# Patient Record
Sex: Male | Born: 1940
Health system: Southern US, Community
[De-identification: ages and names within clinical notes are randomized; demographics above are authoritative.]

## PROBLEM LIST (undated history)

## (undated) DIAGNOSIS — Z85038 Personal history of other malignant neoplasm of large intestine: Secondary | ICD-10-CM

## (undated) DIAGNOSIS — I10 Essential (primary) hypertension: Secondary | ICD-10-CM

## (undated) DIAGNOSIS — E785 Hyperlipidemia, unspecified: Secondary | ICD-10-CM

## (undated) DIAGNOSIS — G4730 Sleep apnea, unspecified: Secondary | ICD-10-CM

## (undated) DIAGNOSIS — J45909 Unspecified asthma, uncomplicated: Secondary | ICD-10-CM

## (undated) DIAGNOSIS — M549 Dorsalgia, unspecified: Secondary | ICD-10-CM

## (undated) DIAGNOSIS — Z87891 Personal history of nicotine dependence: Secondary | ICD-10-CM

## (undated) DIAGNOSIS — J439 Emphysema, unspecified: Secondary | ICD-10-CM

## (undated) DIAGNOSIS — M1990 Unspecified osteoarthritis, unspecified site: Secondary | ICD-10-CM

## (undated) DIAGNOSIS — E119 Type 2 diabetes mellitus without complications: Secondary | ICD-10-CM

## (undated) DIAGNOSIS — Z1211 Encounter for screening for malignant neoplasm of colon: Secondary | ICD-10-CM

## (undated) DIAGNOSIS — R338 Other retention of urine: Secondary | ICD-10-CM

## (undated) DIAGNOSIS — R011 Cardiac murmur, unspecified: Secondary | ICD-10-CM

## (undated) DIAGNOSIS — J449 Chronic obstructive pulmonary disease, unspecified: Secondary | ICD-10-CM

## (undated) DIAGNOSIS — K469 Unspecified abdominal hernia without obstruction or gangrene: Secondary | ICD-10-CM

## (undated) DIAGNOSIS — K635 Polyp of colon: Secondary | ICD-10-CM

## (undated) HISTORY — DX: Dorsalgia, unspecified: M54.9

## (undated) HISTORY — DX: Polyp of colon: K63.5

## (undated) HISTORY — PX: HERNIA REPAIR: SHX51

## (undated) HISTORY — DX: Unspecified abdominal hernia without obstruction or gangrene: K46.9

## (undated) HISTORY — DX: Personal history of other malignant neoplasm of large intestine: Z85.038

## (undated) HISTORY — DX: Type 2 diabetes mellitus without complications: E11.9

## (undated) HISTORY — PX: TONSILLECTOMY: SUR1361

## (undated) HISTORY — DX: Emphysema, unspecified: J43.9

## (undated) HISTORY — DX: Hyperlipidemia, unspecified: E78.5

## (undated) HISTORY — PX: JOINT REPLACEMENT: SHX530

## (undated) HISTORY — DX: Unspecified osteoarthritis, unspecified site: M19.90

## (undated) HISTORY — DX: Essential (primary) hypertension: I10

## (undated) HISTORY — DX: Personal history of nicotine dependence: Z87.891

## (undated) HISTORY — DX: Unspecified asthma, uncomplicated: J45.909

## (undated) HISTORY — PX: COLONOSCOPY: SHX174

## (undated) HISTORY — PX: TYMPANOSTOMY TUBE PLACEMENT: SHX32

## (undated) HISTORY — DX: Encounter for screening for malignant neoplasm of colon: Z12.11

---

## 1968-03-23 HISTORY — PX: CHOLECYSTECTOMY: SHX55

## 1998-03-23 DIAGNOSIS — J45909 Unspecified asthma, uncomplicated: Secondary | ICD-10-CM

## 1998-03-23 DIAGNOSIS — K469 Unspecified abdominal hernia without obstruction or gangrene: Secondary | ICD-10-CM

## 1998-03-23 HISTORY — DX: Unspecified abdominal hernia without obstruction or gangrene: K46.9

## 1998-03-23 HISTORY — DX: Unspecified asthma, uncomplicated: J45.909

## 1999-07-16 HISTORY — PX: COLON SURGERY: SHX602

## 2006-07-22 ENCOUNTER — Ambulatory Visit: Payer: Self-pay | Admitting: Unknown Physician Specialty

## 2006-07-29 ENCOUNTER — Other Ambulatory Visit: Payer: Self-pay

## 2006-07-29 ENCOUNTER — Inpatient Hospital Stay: Payer: Self-pay | Admitting: Unknown Physician Specialty

## 2008-03-23 HISTORY — PX: KNEE SURGERY: SHX244

## 2010-03-23 DIAGNOSIS — K635 Polyp of colon: Secondary | ICD-10-CM

## 2010-03-23 HISTORY — DX: Polyp of colon: K63.5

## 2010-12-30 ENCOUNTER — Ambulatory Visit: Payer: Self-pay | Admitting: General Surgery

## 2011-01-01 LAB — PATHOLOGY REPORT

## 2011-05-19 DIAGNOSIS — H2510 Age-related nuclear cataract, unspecified eye: Secondary | ICD-10-CM | POA: Diagnosis not present

## 2011-05-19 DIAGNOSIS — E119 Type 2 diabetes mellitus without complications: Secondary | ICD-10-CM | POA: Diagnosis not present

## 2011-07-08 DIAGNOSIS — H524 Presbyopia: Secondary | ICD-10-CM | POA: Diagnosis not present

## 2011-07-08 DIAGNOSIS — H53149 Visual discomfort, unspecified: Secondary | ICD-10-CM | POA: Diagnosis not present

## 2011-07-08 DIAGNOSIS — H2510 Age-related nuclear cataract, unspecified eye: Secondary | ICD-10-CM | POA: Diagnosis not present

## 2011-07-08 DIAGNOSIS — E119 Type 2 diabetes mellitus without complications: Secondary | ICD-10-CM | POA: Diagnosis not present

## 2011-07-28 ENCOUNTER — Ambulatory Visit: Payer: Self-pay | Admitting: Family Medicine

## 2011-07-28 DIAGNOSIS — R079 Chest pain, unspecified: Secondary | ICD-10-CM | POA: Diagnosis not present

## 2011-07-28 DIAGNOSIS — J9819 Other pulmonary collapse: Secondary | ICD-10-CM | POA: Diagnosis not present

## 2011-07-28 DIAGNOSIS — I517 Cardiomegaly: Secondary | ICD-10-CM | POA: Diagnosis not present

## 2011-07-28 DIAGNOSIS — E119 Type 2 diabetes mellitus without complications: Secondary | ICD-10-CM | POA: Diagnosis not present

## 2011-07-28 DIAGNOSIS — E785 Hyperlipidemia, unspecified: Secondary | ICD-10-CM | POA: Diagnosis not present

## 2011-07-28 DIAGNOSIS — I10 Essential (primary) hypertension: Secondary | ICD-10-CM | POA: Diagnosis not present

## 2011-07-31 DIAGNOSIS — I10 Essential (primary) hypertension: Secondary | ICD-10-CM | POA: Diagnosis not present

## 2011-07-31 DIAGNOSIS — E119 Type 2 diabetes mellitus without complications: Secondary | ICD-10-CM | POA: Diagnosis not present

## 2011-07-31 DIAGNOSIS — E782 Mixed hyperlipidemia: Secondary | ICD-10-CM | POA: Diagnosis not present

## 2011-07-31 DIAGNOSIS — J449 Chronic obstructive pulmonary disease, unspecified: Secondary | ICD-10-CM | POA: Diagnosis not present

## 2011-08-05 ENCOUNTER — Ambulatory Visit: Payer: Self-pay | Admitting: Family Medicine

## 2011-08-05 DIAGNOSIS — R918 Other nonspecific abnormal finding of lung field: Secondary | ICD-10-CM | POA: Diagnosis not present

## 2011-08-05 DIAGNOSIS — R059 Cough, unspecified: Secondary | ICD-10-CM | POA: Diagnosis not present

## 2011-08-05 DIAGNOSIS — IMO0002 Reserved for concepts with insufficient information to code with codable children: Secondary | ICD-10-CM | POA: Diagnosis not present

## 2011-08-07 DIAGNOSIS — R0789 Other chest pain: Secondary | ICD-10-CM | POA: Diagnosis not present

## 2011-08-11 DIAGNOSIS — H2510 Age-related nuclear cataract, unspecified eye: Secondary | ICD-10-CM | POA: Diagnosis not present

## 2011-08-11 DIAGNOSIS — H269 Unspecified cataract: Secondary | ICD-10-CM | POA: Diagnosis not present

## 2011-08-27 DIAGNOSIS — M940 Chondrocostal junction syndrome [Tietze]: Secondary | ICD-10-CM | POA: Diagnosis not present

## 2011-08-27 DIAGNOSIS — R072 Precordial pain: Secondary | ICD-10-CM | POA: Diagnosis not present

## 2011-09-09 DIAGNOSIS — H2510 Age-related nuclear cataract, unspecified eye: Secondary | ICD-10-CM | POA: Diagnosis not present

## 2011-09-22 DIAGNOSIS — H269 Unspecified cataract: Secondary | ICD-10-CM | POA: Diagnosis not present

## 2011-09-22 DIAGNOSIS — H259 Unspecified age-related cataract: Secondary | ICD-10-CM | POA: Diagnosis not present

## 2011-09-22 DIAGNOSIS — H2510 Age-related nuclear cataract, unspecified eye: Secondary | ICD-10-CM | POA: Diagnosis not present

## 2011-09-30 DIAGNOSIS — E7800 Pure hypercholesterolemia, unspecified: Secondary | ICD-10-CM | POA: Diagnosis not present

## 2011-09-30 DIAGNOSIS — I10 Essential (primary) hypertension: Secondary | ICD-10-CM | POA: Diagnosis not present

## 2011-09-30 DIAGNOSIS — IMO0001 Reserved for inherently not codable concepts without codable children: Secondary | ICD-10-CM | POA: Diagnosis not present

## 2011-11-10 DIAGNOSIS — Z0000 Encounter for general adult medical examination without abnormal findings: Secondary | ICD-10-CM | POA: Diagnosis not present

## 2011-11-10 DIAGNOSIS — E119 Type 2 diabetes mellitus without complications: Secondary | ICD-10-CM | POA: Diagnosis not present

## 2011-11-10 DIAGNOSIS — I10 Essential (primary) hypertension: Secondary | ICD-10-CM | POA: Diagnosis not present

## 2011-11-10 DIAGNOSIS — Z125 Encounter for screening for malignant neoplasm of prostate: Secondary | ICD-10-CM | POA: Diagnosis not present

## 2011-11-10 DIAGNOSIS — IMO0001 Reserved for inherently not codable concepts without codable children: Secondary | ICD-10-CM | POA: Diagnosis not present

## 2011-11-10 DIAGNOSIS — E785 Hyperlipidemia, unspecified: Secondary | ICD-10-CM | POA: Diagnosis not present

## 2011-12-02 DIAGNOSIS — I10 Essential (primary) hypertension: Secondary | ICD-10-CM | POA: Diagnosis not present

## 2011-12-02 DIAGNOSIS — L570 Actinic keratosis: Secondary | ICD-10-CM | POA: Diagnosis not present

## 2012-02-10 DIAGNOSIS — Z23 Encounter for immunization: Secondary | ICD-10-CM | POA: Diagnosis not present

## 2012-02-10 DIAGNOSIS — IMO0001 Reserved for inherently not codable concepts without codable children: Secondary | ICD-10-CM | POA: Diagnosis not present

## 2012-02-10 DIAGNOSIS — I10 Essential (primary) hypertension: Secondary | ICD-10-CM | POA: Diagnosis not present

## 2012-02-10 DIAGNOSIS — E7800 Pure hypercholesterolemia, unspecified: Secondary | ICD-10-CM | POA: Diagnosis not present

## 2012-02-11 DIAGNOSIS — E119 Type 2 diabetes mellitus without complications: Secondary | ICD-10-CM | POA: Diagnosis not present

## 2012-03-07 DIAGNOSIS — I10 Essential (primary) hypertension: Secondary | ICD-10-CM | POA: Diagnosis not present

## 2012-03-07 DIAGNOSIS — IMO0001 Reserved for inherently not codable concepts without codable children: Secondary | ICD-10-CM | POA: Diagnosis not present

## 2012-03-07 DIAGNOSIS — E7800 Pure hypercholesterolemia, unspecified: Secondary | ICD-10-CM | POA: Diagnosis not present

## 2012-03-08 DIAGNOSIS — R220 Localized swelling, mass and lump, head: Secondary | ICD-10-CM | POA: Diagnosis not present

## 2012-03-08 DIAGNOSIS — L989 Disorder of the skin and subcutaneous tissue, unspecified: Secondary | ICD-10-CM | POA: Diagnosis not present

## 2012-03-08 DIAGNOSIS — H6120 Impacted cerumen, unspecified ear: Secondary | ICD-10-CM | POA: Diagnosis not present

## 2012-03-08 DIAGNOSIS — D1039 Benign neoplasm of other parts of mouth: Secondary | ICD-10-CM | POA: Diagnosis not present

## 2012-03-08 DIAGNOSIS — R221 Localized swelling, mass and lump, neck: Secondary | ICD-10-CM | POA: Diagnosis not present

## 2012-03-08 DIAGNOSIS — H908 Mixed conductive and sensorineural hearing loss, unspecified: Secondary | ICD-10-CM | POA: Diagnosis not present

## 2012-03-08 DIAGNOSIS — K1370 Unspecified lesions of oral mucosa: Secondary | ICD-10-CM | POA: Diagnosis not present

## 2012-03-24 DIAGNOSIS — IMO0001 Reserved for inherently not codable concepts without codable children: Secondary | ICD-10-CM | POA: Diagnosis not present

## 2012-03-24 DIAGNOSIS — I10 Essential (primary) hypertension: Secondary | ICD-10-CM | POA: Diagnosis not present

## 2012-04-04 ENCOUNTER — Ambulatory Visit: Payer: Self-pay | Admitting: Otolaryngology

## 2012-04-15 DIAGNOSIS — IMO0001 Reserved for inherently not codable concepts without codable children: Secondary | ICD-10-CM | POA: Diagnosis not present

## 2012-04-18 ENCOUNTER — Ambulatory Visit: Payer: Self-pay | Admitting: Otolaryngology

## 2012-04-18 DIAGNOSIS — N419 Inflammatory disease of prostate, unspecified: Secondary | ICD-10-CM | POA: Diagnosis not present

## 2012-04-18 DIAGNOSIS — R0609 Other forms of dyspnea: Secondary | ICD-10-CM | POA: Diagnosis not present

## 2012-04-18 DIAGNOSIS — N400 Enlarged prostate without lower urinary tract symptoms: Secondary | ICD-10-CM | POA: Diagnosis not present

## 2012-04-18 DIAGNOSIS — Z79899 Other long term (current) drug therapy: Secondary | ICD-10-CM | POA: Diagnosis not present

## 2012-04-18 DIAGNOSIS — Z85038 Personal history of other malignant neoplasm of large intestine: Secondary | ICD-10-CM | POA: Diagnosis not present

## 2012-04-18 DIAGNOSIS — E119 Type 2 diabetes mellitus without complications: Secondary | ICD-10-CM | POA: Diagnosis not present

## 2012-04-18 DIAGNOSIS — E669 Obesity, unspecified: Secondary | ICD-10-CM | POA: Diagnosis not present

## 2012-04-18 DIAGNOSIS — Z7982 Long term (current) use of aspirin: Secondary | ICD-10-CM | POA: Diagnosis not present

## 2012-04-18 DIAGNOSIS — Z794 Long term (current) use of insulin: Secondary | ICD-10-CM | POA: Diagnosis not present

## 2012-04-18 DIAGNOSIS — D1779 Benign lipomatous neoplasm of other sites: Secondary | ICD-10-CM | POA: Diagnosis not present

## 2012-04-18 DIAGNOSIS — M129 Arthropathy, unspecified: Secondary | ICD-10-CM | POA: Diagnosis not present

## 2012-04-18 DIAGNOSIS — R42 Dizziness and giddiness: Secondary | ICD-10-CM | POA: Diagnosis not present

## 2012-04-18 DIAGNOSIS — R609 Edema, unspecified: Secondary | ICD-10-CM | POA: Diagnosis not present

## 2012-04-18 DIAGNOSIS — D1739 Benign lipomatous neoplasm of skin and subcutaneous tissue of other sites: Secondary | ICD-10-CM | POA: Diagnosis not present

## 2012-04-18 DIAGNOSIS — Z96659 Presence of unspecified artificial knee joint: Secondary | ICD-10-CM | POA: Diagnosis not present

## 2012-04-18 DIAGNOSIS — G4730 Sleep apnea, unspecified: Secondary | ICD-10-CM | POA: Diagnosis not present

## 2012-04-18 DIAGNOSIS — I10 Essential (primary) hypertension: Secondary | ICD-10-CM | POA: Diagnosis not present

## 2012-04-19 LAB — PATHOLOGY REPORT

## 2012-06-14 DIAGNOSIS — I10 Essential (primary) hypertension: Secondary | ICD-10-CM | POA: Diagnosis not present

## 2012-06-14 DIAGNOSIS — IMO0001 Reserved for inherently not codable concepts without codable children: Secondary | ICD-10-CM | POA: Diagnosis not present

## 2012-06-14 DIAGNOSIS — E785 Hyperlipidemia, unspecified: Secondary | ICD-10-CM | POA: Diagnosis not present

## 2012-06-14 DIAGNOSIS — E119 Type 2 diabetes mellitus without complications: Secondary | ICD-10-CM | POA: Diagnosis not present

## 2012-09-14 DIAGNOSIS — E119 Type 2 diabetes mellitus without complications: Secondary | ICD-10-CM | POA: Diagnosis not present

## 2012-09-14 DIAGNOSIS — I10 Essential (primary) hypertension: Secondary | ICD-10-CM | POA: Diagnosis not present

## 2012-09-14 DIAGNOSIS — E785 Hyperlipidemia, unspecified: Secondary | ICD-10-CM | POA: Diagnosis not present

## 2012-09-14 DIAGNOSIS — IMO0001 Reserved for inherently not codable concepts without codable children: Secondary | ICD-10-CM | POA: Diagnosis not present

## 2012-10-06 ENCOUNTER — Encounter: Payer: Self-pay | Admitting: *Deleted

## 2012-10-06 DIAGNOSIS — Z85038 Personal history of other malignant neoplasm of large intestine: Secondary | ICD-10-CM | POA: Insufficient documentation

## 2012-10-06 DIAGNOSIS — Z1211 Encounter for screening for malignant neoplasm of colon: Secondary | ICD-10-CM | POA: Insufficient documentation

## 2012-12-29 DIAGNOSIS — E119 Type 2 diabetes mellitus without complications: Secondary | ICD-10-CM | POA: Diagnosis not present

## 2012-12-29 DIAGNOSIS — Z0000 Encounter for general adult medical examination without abnormal findings: Secondary | ICD-10-CM | POA: Diagnosis not present

## 2012-12-29 DIAGNOSIS — Z125 Encounter for screening for malignant neoplasm of prostate: Secondary | ICD-10-CM | POA: Diagnosis not present

## 2012-12-29 DIAGNOSIS — G4730 Sleep apnea, unspecified: Secondary | ICD-10-CM | POA: Diagnosis not present

## 2012-12-29 DIAGNOSIS — E785 Hyperlipidemia, unspecified: Secondary | ICD-10-CM | POA: Diagnosis not present

## 2012-12-29 DIAGNOSIS — I10 Essential (primary) hypertension: Secondary | ICD-10-CM | POA: Diagnosis not present

## 2012-12-29 DIAGNOSIS — Z23 Encounter for immunization: Secondary | ICD-10-CM | POA: Diagnosis not present

## 2012-12-29 DIAGNOSIS — Q278 Other specified congenital malformations of peripheral vascular system: Secondary | ICD-10-CM | POA: Diagnosis not present

## 2013-01-12 DIAGNOSIS — G4733 Obstructive sleep apnea (adult) (pediatric): Secondary | ICD-10-CM | POA: Diagnosis not present

## 2013-01-19 DIAGNOSIS — M79609 Pain in unspecified limb: Secondary | ICD-10-CM | POA: Diagnosis not present

## 2013-01-19 DIAGNOSIS — I739 Peripheral vascular disease, unspecified: Secondary | ICD-10-CM | POA: Diagnosis not present

## 2013-01-19 DIAGNOSIS — IMO0001 Reserved for inherently not codable concepts without codable children: Secondary | ICD-10-CM | POA: Diagnosis not present

## 2013-01-19 DIAGNOSIS — M5430 Sciatica, unspecified side: Secondary | ICD-10-CM | POA: Diagnosis not present

## 2013-01-24 DIAGNOSIS — G4733 Obstructive sleep apnea (adult) (pediatric): Secondary | ICD-10-CM | POA: Diagnosis not present

## 2013-04-21 DIAGNOSIS — G4733 Obstructive sleep apnea (adult) (pediatric): Secondary | ICD-10-CM | POA: Diagnosis not present

## 2013-05-03 DIAGNOSIS — E119 Type 2 diabetes mellitus without complications: Secondary | ICD-10-CM | POA: Diagnosis not present

## 2013-05-31 DIAGNOSIS — K140 Glossitis: Secondary | ICD-10-CM | POA: Diagnosis not present

## 2013-05-31 DIAGNOSIS — J301 Allergic rhinitis due to pollen: Secondary | ICD-10-CM | POA: Diagnosis not present

## 2013-07-31 DIAGNOSIS — IMO0001 Reserved for inherently not codable concepts without codable children: Secondary | ICD-10-CM | POA: Diagnosis not present

## 2013-07-31 DIAGNOSIS — I70219 Atherosclerosis of native arteries of extremities with intermittent claudication, unspecified extremity: Secondary | ICD-10-CM | POA: Diagnosis not present

## 2013-07-31 DIAGNOSIS — E785 Hyperlipidemia, unspecified: Secondary | ICD-10-CM | POA: Diagnosis not present

## 2013-07-31 DIAGNOSIS — I10 Essential (primary) hypertension: Secondary | ICD-10-CM | POA: Diagnosis not present

## 2013-08-01 DIAGNOSIS — E119 Type 2 diabetes mellitus without complications: Secondary | ICD-10-CM | POA: Diagnosis not present

## 2013-08-01 DIAGNOSIS — I10 Essential (primary) hypertension: Secondary | ICD-10-CM | POA: Diagnosis not present

## 2013-08-01 DIAGNOSIS — E785 Hyperlipidemia, unspecified: Secondary | ICD-10-CM | POA: Diagnosis not present

## 2013-11-23 DIAGNOSIS — H6120 Impacted cerumen, unspecified ear: Secondary | ICD-10-CM | POA: Diagnosis not present

## 2013-11-23 DIAGNOSIS — H903 Sensorineural hearing loss, bilateral: Secondary | ICD-10-CM | POA: Diagnosis not present

## 2013-11-23 DIAGNOSIS — H6980 Other specified disorders of Eustachian tube, unspecified ear: Secondary | ICD-10-CM | POA: Diagnosis not present

## 2013-12-22 DIAGNOSIS — B353 Tinea pedis: Secondary | ICD-10-CM | POA: Diagnosis not present

## 2013-12-22 DIAGNOSIS — B351 Tinea unguium: Secondary | ICD-10-CM | POA: Diagnosis not present

## 2013-12-22 DIAGNOSIS — E119 Type 2 diabetes mellitus without complications: Secondary | ICD-10-CM | POA: Diagnosis not present

## 2013-12-22 DIAGNOSIS — M79675 Pain in left toe(s): Secondary | ICD-10-CM | POA: Diagnosis not present

## 2014-01-29 DIAGNOSIS — G4730 Sleep apnea, unspecified: Secondary | ICD-10-CM | POA: Diagnosis not present

## 2014-01-29 DIAGNOSIS — M179 Osteoarthritis of knee, unspecified: Secondary | ICD-10-CM | POA: Diagnosis not present

## 2014-01-29 DIAGNOSIS — C189 Malignant neoplasm of colon, unspecified: Secondary | ICD-10-CM | POA: Diagnosis not present

## 2014-01-29 DIAGNOSIS — E119 Type 2 diabetes mellitus without complications: Secondary | ICD-10-CM | POA: Diagnosis not present

## 2014-01-29 DIAGNOSIS — I10 Essential (primary) hypertension: Secondary | ICD-10-CM | POA: Diagnosis not present

## 2014-01-29 DIAGNOSIS — I779 Disorder of arteries and arterioles, unspecified: Secondary | ICD-10-CM | POA: Diagnosis not present

## 2014-01-29 DIAGNOSIS — E785 Hyperlipidemia, unspecified: Secondary | ICD-10-CM | POA: Diagnosis not present

## 2014-04-03 DIAGNOSIS — H6983 Other specified disorders of Eustachian tube, bilateral: Secondary | ICD-10-CM | POA: Diagnosis not present

## 2014-04-03 DIAGNOSIS — H60393 Other infective otitis externa, bilateral: Secondary | ICD-10-CM | POA: Diagnosis not present

## 2014-05-18 DIAGNOSIS — G4733 Obstructive sleep apnea (adult) (pediatric): Secondary | ICD-10-CM | POA: Diagnosis not present

## 2014-06-07 DIAGNOSIS — K4090 Unilateral inguinal hernia, without obstruction or gangrene, not specified as recurrent: Secondary | ICD-10-CM | POA: Diagnosis not present

## 2014-06-08 ENCOUNTER — Encounter: Payer: Self-pay | Admitting: General Surgery

## 2014-06-18 ENCOUNTER — Ambulatory Visit: Payer: Medicare Other | Admitting: General Surgery

## 2014-06-20 ENCOUNTER — Encounter: Payer: Self-pay | Admitting: General Surgery

## 2014-06-20 ENCOUNTER — Ambulatory Visit (INDEPENDENT_AMBULATORY_CARE_PROVIDER_SITE_OTHER): Payer: Medicare Other | Admitting: General Surgery

## 2014-06-20 VITALS — BP 140/70 | HR 76 | Resp 20 | Ht 72.0 in | Wt 292.0 lb

## 2014-06-20 DIAGNOSIS — L918 Other hypertrophic disorders of the skin: Secondary | ICD-10-CM

## 2014-06-20 DIAGNOSIS — R1031 Right lower quadrant pain: Secondary | ICD-10-CM

## 2014-06-20 DIAGNOSIS — R1030 Lower abdominal pain, unspecified: Secondary | ICD-10-CM | POA: Diagnosis not present

## 2014-06-20 NOTE — Patient Instructions (Signed)
The patient is aware to call back for any questions or concerns.  

## 2014-06-20 NOTE — Progress Notes (Signed)
Patient ID: Justin Griffith, male   DOB: 1940-05-19, 74 y.o.   MRN: [id number]  Chief Complaint  Patient presents with  . Other    hernia    HPI Justin Griffith is a 74 y.o. male.  Here today for evaluation of a possible right inguinal hernia. He states it has been there for about 8-9 years after he lifted a large TV. He states about  5-6 months ago it started hurting. The pain radiates up to his back. The pain does not last long. He has recently lost over 50 pounds by greatly modifying his diet. Bowels move every other day, no bleeding.   HPI  Past Medical History  Diagnosis Date  . Arthritis   . Diabetes mellitus without complication   . Hernia 2000  . Hypertension   . Personal history of tobacco use, presenting hazards to health   . Asthma 2000  . Special screening for malignant neoplasms, colon   . Personal history of malignant neoplasm of large intestine   . Hyperlipidemia   . Obesity, unspecified   . Colon polyp 2012  . Back pain   . Cancer 2001    Sigmoid colon, T1, N2 status post adjuvant chemotherapy     Past Surgical History  Procedure Laterality Date  . Cholecystectomy  1970  . Knee surgery Bilateral 2010  . Colonoscopy  2001, 2012    Dr Bary Castilla, tubular adenoma of the cecum and ascending colon in 2012.  . Colon surgery  07-16-99    sigmoid colon resection with primary anastomosis, chemotherapy for metastatic disease    Family History  Problem Relation Age of Onset  . Diabetes Father   . Cancer Mother     Social History History  Substance Use Topics  . Smoking status: Former Smoker -- 1.00 packs/day for 30 years    Quit date: 03/23/1989  . Smokeless tobacco: Never Used  . Alcohol Use: 0.0 oz/week    0 Standard drinks or equivalent per week     Comment: occasionally    No Known Allergies  Current Outpatient Prescriptions  Medication Sig Dispense Refill  . aspirin EC 81 MG tablet Take by mouth.    . cloNIDine (CATAPRES) 0.1 MG tablet Take 0.1 mg  by mouth daily.    . dapagliflozin propanediol (FARXIGA) 10 MG TABS tablet Take 10 mg by mouth daily.    . fenofibrate (TRICOR) 145 MG tablet Take 145 mg by mouth daily.    . Olmesartan-Amlodipine-HCTZ 40-10-25 MG TABS Take by mouth daily.    . rosuvastatin (CRESTOR) 40 MG tablet Take 40 mg by mouth daily.    . valACYclovir (VALTREX) 500 MG tablet Take 500 mg by mouth daily.     No current facility-administered medications for this visit.    Review of Systems Review of Systems  Constitutional: Negative.   Respiratory: Positive for cough.   Cardiovascular: Negative.   Genitourinary: Positive for difficulty urinating.    Blood pressure 140/70, pulse 76, resp. rate 20, height 6' (1.829 m), weight 292 lb (132.45 kg).  Physical Exam Physical Exam  Constitutional: He is oriented to person, place, and time. He appears well-developed and well-nourished.  Neck: Neck supple.  Cardiovascular: Normal rate, regular rhythm and normal heart sounds.   Pulses:      Femoral pulses are 2+ on the right side, and 2+ on the left side. Pulmonary/Chest: Effort normal and breath sounds normal.  Abdominal: Soft. Normal appearance and bowel sounds are normal. There is no  tenderness. No hernia.  Musculoskeletal:       Legs: Lymphadenopathy:    He has no cervical adenopathy.  Neurological: He is alert and oriented to person, place, and time.  Skin: Skin is warm and dry.  6 cm lipoma T8 left of midline. Left inner thigh 6 mm irritated skin tag.    Data Reviewed 2012 colonoscopy.  PCP notes of 06/07/2014 from Golden Pop, M.D.    Assessment    No evidence of inguinal or ventral hernia.  Irritated skin tag.    Plan    The patient is due for a repeat colonoscopy in 2017. If the skin tag in the left inner thigh remains irritated we'll arrange for excision as an office procedure.    Recommend excision left inner thigh skin tag.      PCP:  Kirkland Hun 06/21/2014,  4:15 PM

## 2014-06-21 ENCOUNTER — Encounter: Payer: Self-pay | Admitting: General Surgery

## 2014-06-21 DIAGNOSIS — L918 Other hypertrophic disorders of the skin: Secondary | ICD-10-CM | POA: Insufficient documentation

## 2014-06-21 DIAGNOSIS — R1031 Right lower quadrant pain: Secondary | ICD-10-CM | POA: Insufficient documentation

## 2014-06-28 DIAGNOSIS — B351 Tinea unguium: Secondary | ICD-10-CM | POA: Diagnosis not present

## 2014-06-28 DIAGNOSIS — M79675 Pain in left toe(s): Secondary | ICD-10-CM | POA: Diagnosis not present

## 2014-06-28 DIAGNOSIS — L97521 Non-pressure chronic ulcer of other part of left foot limited to breakdown of skin: Secondary | ICD-10-CM | POA: Diagnosis not present

## 2014-07-02 ENCOUNTER — Ambulatory Visit (INDEPENDENT_AMBULATORY_CARE_PROVIDER_SITE_OTHER): Payer: Medicare Other | Admitting: General Surgery

## 2014-07-02 ENCOUNTER — Encounter: Payer: Self-pay | Admitting: General Surgery

## 2014-07-02 VITALS — BP 136/78 | HR 80 | Resp 14 | Ht 72.0 in | Wt 288.0 lb

## 2014-07-02 DIAGNOSIS — L918 Other hypertrophic disorders of the skin: Secondary | ICD-10-CM | POA: Diagnosis not present

## 2014-07-02 NOTE — Progress Notes (Signed)
Patient ID: Justin Griffith, male   DOB: 11-23-1940, 74 y.o.   MRN: [id number]  Chief Complaint  Patient presents with  . Other    SKINTAG REMOVAL    HPI Justin Griffith is a 74 y.o. male  Here for removal of skin tag from back of left upper leg.  HPI  Past Medical History  Diagnosis Date  . Arthritis   . Diabetes mellitus without complication   . Hernia 2000  . Hypertension   . Personal history of tobacco use, presenting hazards to health   . Asthma 2000  . Special screening for malignant neoplasms, colon   . Personal history of malignant neoplasm of large intestine   . Hyperlipidemia   . Obesity, unspecified   . Colon polyp 2012  . Back pain   . Cancer 2001    Sigmoid colon, T1, N2 status post adjuvant chemotherapy     Past Surgical History  Procedure Laterality Date  . Cholecystectomy  1970  . Knee surgery Bilateral 2010  . Colonoscopy  2001, 2012    Dr Bary Castilla, tubular adenoma of the cecum and ascending colon in 2012.  . Colon surgery  07-16-99    sigmoid colon resection with primary anastomosis, chemotherapy for metastatic disease    Family History  Problem Relation Age of Onset  . Diabetes Father   . Cancer Mother     Social History History  Substance Use Topics  . Smoking status: Former Smoker -- 1.00 packs/day for 30 years    Quit date: 03/23/1989  . Smokeless tobacco: Never Used  . Alcohol Use: 0.0 oz/week    0 Standard drinks or equivalent per week     Comment: occasionally    No Known Allergies  Current Outpatient Prescriptions  Medication Sig Dispense Refill  . aspirin EC 81 MG tablet Take by mouth.    . cloNIDine (CATAPRES) 0.1 MG tablet Take 0.1 mg by mouth daily.    . dapagliflozin propanediol (FARXIGA) 10 MG TABS tablet Take 10 mg by mouth daily.    . fenofibrate (TRICOR) 145 MG tablet Take 145 mg by mouth daily.    . Olmesartan-Amlodipine-HCTZ 40-10-25 MG TABS Take by mouth daily.    . rosuvastatin (CRESTOR) 40 MG tablet Take 40 mg by  mouth daily.    . valACYclovir (VALTREX) 500 MG tablet Take 500 mg by mouth daily.     No current facility-administered medications for this visit.    Review of Systems Review of Systems  Constitutional: Negative.   Respiratory: Negative.   Cardiovascular: Negative.     Blood pressure 136/78, pulse 80, resp. rate 14, height 6' (1.829 m), weight 288 lb (130.636 kg).  Physical Exam Physical Exam  Genitourinary:          Assessment    Left medial proximal calf skin tag    Plan    The patient was asked to apply Neosporin to the area until its completely healed. Follow-up will be on an as-needed basis. No specimen was sent for pathologic review.    PCP: Kirkland Hun 07/03/2014, 4:08 PM

## 2014-07-05 DIAGNOSIS — I779 Disorder of arteries and arterioles, unspecified: Secondary | ICD-10-CM | POA: Diagnosis not present

## 2014-07-05 DIAGNOSIS — E785 Hyperlipidemia, unspecified: Secondary | ICD-10-CM | POA: Diagnosis not present

## 2014-07-05 DIAGNOSIS — Z0000 Encounter for general adult medical examination without abnormal findings: Secondary | ICD-10-CM | POA: Diagnosis not present

## 2014-07-05 DIAGNOSIS — I10 Essential (primary) hypertension: Secondary | ICD-10-CM | POA: Diagnosis not present

## 2014-07-05 DIAGNOSIS — E119 Type 2 diabetes mellitus without complications: Secondary | ICD-10-CM | POA: Diagnosis not present

## 2014-07-05 DIAGNOSIS — Z125 Encounter for screening for malignant neoplasm of prostate: Secondary | ICD-10-CM | POA: Diagnosis not present

## 2014-07-06 DIAGNOSIS — H906 Mixed conductive and sensorineural hearing loss, bilateral: Secondary | ICD-10-CM | POA: Diagnosis not present

## 2014-07-06 DIAGNOSIS — H6981 Other specified disorders of Eustachian tube, right ear: Secondary | ICD-10-CM | POA: Diagnosis not present

## 2014-07-13 NOTE — Op Note (Signed)
DATE OF BIRTH:  04/28/1940  DATE OF PROCEDURE:  04/18/2012  PREOPERATIVE DIAGNOSIS:  Deep neck mass.   POSTOPERATIVE DIAGNOSIS:  Deep neck mass.   PROCEDURE PERFORMED:  Excision of a left-sided deep neck mass.   ANESTHESIA:  [name].   INTRAVENOUS FLUIDS:  Please see anesthesia record.   COMPLICATIONS:  None.   DRAINS AND PLACEMENTS:  A size 7 TLS drain.   SPECIMENS:  Left-sided neck mass.   SURGEON:  Jerene Bears, MD  ASSISTANT:  Mahlon Gammon, MD  INDICATIONS FOR PROCEDURE:  The patient is a 74 year old male with history of a left-sided neck mass that has gradually increased. He previously was evaluated and diagnosed with a probable lipoma by FNA.   OPERATIVE FINDINGS:  Large interdigitated left-sided neck mass deep to the platysma adjacent to the submandibular gland. It was circumferentially excised.   DESCRIPTION OF PROCEDURE:  After the patient was identified in the holding room, the benefits and risks of the procedure were discussed, and consent was reviewed. The patient was taken to the operating room and placed in the supine position. [name] anesthesia was induced. The patient was prepped and draped in a sterile fashion after injection of 6 mL of 1% lidocaine with 1:100,000 epinephrine was injected into the patient's left anterior neck.   A 15 blade scalpel was used to make a previously marked left neck incision on a previously marked anterior neck crease. Dissection was carried through subcutaneous tissues down to the level of the platysma using bipolar electrocautery and sharp techniques. The platysma was encountered, and just deep to the platysma, fibrotically scarred to the underlying platysma was a lipomatous lesion. Using a combination of blunt and sharp techniques, the lipoma was circumferentially dissected anteriorly and inferiorly and posteriorly initially. This was separated from surrounding fatty tissue, and care was taken superiorly for the marginal mandibular  nerve. Using blunt and sharp techniques, the lipoma was separated from the underlying platysma and then the subcutaneous tissues as well, and the left submandibular gland was encountered. Using blunt techniques, the lipoma was separated from the inferior aspect of the submandibular gland.   At this time, the lipoma was passed off the table for permanent pathological evaluation. The wound was copiously irrigated and inspected. No obvious remaining lipoma was noted, but again there was fibrotic scarring to the platysma, as well as the surrounding subcutaneous fat. Meticulous hemostasis was achieved. A size 7 TLS drain was placed in the patient's left neck. Subcutaneous Vicryls were placed for reapproximation of the skin edges, and then the skin was closed with Dermabond skin adhesive. The patient tolerated the procedure well and was taken to PACU in good condition.    ____________________________ Jerene Bears, MD ccv:ms D: 04/18/2012 13:46:26 ET T: 04/18/2012 21:47:36 ET JOB#: [id number]  cc: Jerene Bears, MD, <Dictator> Jerene Bears MD ELECTRONICALLY SIGNED 04/19/2012 12:46

## 2014-07-20 DIAGNOSIS — G4733 Obstructive sleep apnea (adult) (pediatric): Secondary | ICD-10-CM | POA: Diagnosis not present

## 2014-08-06 DIAGNOSIS — H60542 Acute eczematoid otitis externa, left ear: Secondary | ICD-10-CM | POA: Diagnosis not present

## 2014-08-06 DIAGNOSIS — J019 Acute sinusitis, unspecified: Secondary | ICD-10-CM | POA: Diagnosis not present

## 2014-08-06 DIAGNOSIS — H6983 Other specified disorders of Eustachian tube, bilateral: Secondary | ICD-10-CM | POA: Diagnosis not present

## 2014-08-07 ENCOUNTER — Encounter: Payer: Self-pay | Admitting: General Surgery

## 2014-08-09 ENCOUNTER — Encounter
Admission: RE | Admit: 2014-08-09 | Discharge: 2014-08-09 | Disposition: A | Discharge status: Home / Self Care | Payer: Medicare Other | Source: Ambulatory Visit | Attending: Otolaryngology | Admitting: Otolaryngology

## 2014-08-09 DIAGNOSIS — Z01812 Encounter for preprocedural laboratory examination: Secondary | ICD-10-CM | POA: Insufficient documentation

## 2014-08-09 DIAGNOSIS — I10 Essential (primary) hypertension: Secondary | ICD-10-CM | POA: Diagnosis not present

## 2014-08-09 DIAGNOSIS — Z01810 Encounter for preprocedural cardiovascular examination: Secondary | ICD-10-CM | POA: Diagnosis not present

## 2014-08-09 DIAGNOSIS — H902 Conductive hearing loss, unspecified: Secondary | ICD-10-CM | POA: Insufficient documentation

## 2014-08-09 HISTORY — DX: Sleep apnea, unspecified: G47.30

## 2014-08-09 LAB — BASIC METABOLIC PANEL
Anion gap: 7 (ref 5–15)
BUN: 29 mg/dL — ABNORMAL HIGH (ref 6–20)
CO2: 29 mmol/L (ref 22–32)
Calcium: 10.9 mg/dL — ABNORMAL HIGH (ref 8.9–10.3)
Chloride: 104 mmol/L (ref 101–111)
Creatinine, Ser: 1.17 mg/dL (ref 0.61–1.24)
GFR calc Af Amer: >60 mL/min (ref >60)
GFR calc non Af Amer: 60 mL/min — ABNORMAL LOW (ref >60)
Glucose, Bld: 176 mg/dL — ABNORMAL HIGH (ref 65–99)
Potassium: 4.9 mmol/L (ref 3.5–5.1)
Sodium: 140 mmol/L (ref 135–145)

## 2014-08-09 NOTE — Patient Instructions (Signed)
  Your procedure is scheduled on: Aug 16, 2014 Report to Same Day Surgery. To find out your arrival time please call [phone] between 1PM - 3PM on Aug 15, 2014 Remember: Instructions that are not followed completely may result in serious medical risk, up to and including death, or upon the discretion of your surgeon and anesthesiologist your surgery may need to be rescheduled.    __x__ 1. Do not eat food or drink liquids after midnight. No gum chewing or hard candies.     __x__ 2. No Alcohol for 24 hours before or after surgery.   ____ 3. Bring all medications with you on the day of surgery if instructed.    __x__ 4. Notify your doctor if there is any change in your medical condition     (cold, fever, infections).     Do not wear jewelry, make-up, hairpins, clips or nail polish.  Do not wear lotions, powders, or perfumes. You may wear deodorant.  Do not shave 48 hours prior to surgery. Men may shave face and neck.  Do not bring valuables to the hospital.    [facility] is not responsible for any belongings or valuables.               Contacts, dentures or bridgework may not be worn into surgery.  Leave your suitcase in the car. After surgery it may be brought to your room.  For patients admitted to the hospital, discharge time is determined by your                treatment team.   Patients discharged the day of surgery will not be allowed to drive home.   Please read over the following fact sheets that you were given:   ____ Take these medicines the morning of surgery with A SIP OF WATER:          ____ Fleet Enema (as directed)   ____ Use CHG Soap as directed  ____ Use inhalers on the day of surgery  ____ Stop metformin 2 days prior to surgery             NO INSULIN MORNING OF SURGERY !!  ____ Take 1/2 of usual insulin dose the night before surgery and none on the morning of surgery.   ____ Stop Coumadin/Plavix/aspirin on ( pt. has stopped aspirin)  ____ Stop  Anti-inflammatories on   ____ Stop supplements until after surgery.    __x__ Bring C-Pap to the hospital.

## 2014-08-14 DIAGNOSIS — R9431 Abnormal electrocardiogram [ECG] [EKG]: Secondary | ICD-10-CM | POA: Diagnosis not present

## 2014-08-14 DIAGNOSIS — E669 Obesity, unspecified: Secondary | ICD-10-CM | POA: Diagnosis not present

## 2014-08-14 DIAGNOSIS — R011 Cardiac murmur, unspecified: Secondary | ICD-10-CM | POA: Diagnosis not present

## 2014-08-14 DIAGNOSIS — Z01818 Encounter for other preprocedural examination: Secondary | ICD-10-CM | POA: Diagnosis not present

## 2014-08-15 NOTE — OR Nursing (Signed)
Cleared by callwood

## 2014-08-16 ENCOUNTER — Ambulatory Visit: Payer: Medicare Other | Admitting: Certified Registered Nurse Anesthetist

## 2014-08-16 ENCOUNTER — Encounter
Admission: RE | Disposition: A | Discharge status: Home / Self Care | Payer: Self-pay | Source: Ambulatory Visit | Attending: Otolaryngology

## 2014-08-16 ENCOUNTER — Ambulatory Visit
Admission: RE | Admit: 2014-08-16 | Discharge: 2014-08-16 | Disposition: A | Discharge status: Home / Self Care | Payer: Medicare Other | Source: Ambulatory Visit | Attending: Otolaryngology | Admitting: Otolaryngology

## 2014-08-16 ENCOUNTER — Encounter: Payer: Self-pay | Admitting: *Deleted

## 2014-08-16 DIAGNOSIS — H902 Conductive hearing loss, unspecified: Secondary | ICD-10-CM | POA: Diagnosis not present

## 2014-08-16 DIAGNOSIS — E785 Hyperlipidemia, unspecified: Secondary | ICD-10-CM | POA: Insufficient documentation

## 2014-08-16 DIAGNOSIS — Z7982 Long term (current) use of aspirin: Secondary | ICD-10-CM | POA: Insufficient documentation

## 2014-08-16 DIAGNOSIS — H6993 Unspecified Eustachian tube disorder, bilateral: Secondary | ICD-10-CM | POA: Diagnosis not present

## 2014-08-16 DIAGNOSIS — H6983 Other specified disorders of Eustachian tube, bilateral: Secondary | ICD-10-CM | POA: Diagnosis not present

## 2014-08-16 DIAGNOSIS — I10 Essential (primary) hypertension: Secondary | ICD-10-CM | POA: Diagnosis not present

## 2014-08-16 DIAGNOSIS — H908 Mixed conductive and sensorineural hearing loss, unspecified: Secondary | ICD-10-CM | POA: Diagnosis not present

## 2014-08-16 DIAGNOSIS — E119 Type 2 diabetes mellitus without complications: Secondary | ICD-10-CM | POA: Diagnosis not present

## 2014-08-16 DIAGNOSIS — H900 Conductive hearing loss, bilateral: Secondary | ICD-10-CM | POA: Diagnosis not present

## 2014-08-16 DIAGNOSIS — Z79899 Other long term (current) drug therapy: Secondary | ICD-10-CM | POA: Insufficient documentation

## 2014-08-16 DIAGNOSIS — H906 Mixed conductive and sensorineural hearing loss, bilateral: Secondary | ICD-10-CM | POA: Diagnosis not present

## 2014-08-16 HISTORY — PX: MYRINGOTOMY WITH TUBE PLACEMENT: SHX5663

## 2014-08-16 LAB — GLUCOSE, CAPILLARY
Glucose-Capillary: 168 mg/dL — ABNORMAL HIGH (ref 65–99)
Glucose-Capillary: 160 mg/dL — ABNORMAL HIGH (ref 65–99)

## 2014-08-16 SURGERY — MYRINGOTOMY WITH TUBE PLACEMENT
Anesthesia: General | Laterality: Bilateral

## 2014-08-16 MED ORDER — ONDANSETRON HCL 4 MG/2ML IJ SOLN
INTRAMUSCULAR | Status: DC | PRN
  Administered 2014-08-16: 4 mg via INTRAVENOUS

## 2014-08-16 MED ORDER — ONDANSETRON HCL 4 MG/2ML IJ SOLN: 4.0000 mg | Freq: Once | INTRAMUSCULAR | Status: DC | PRN

## 2014-08-16 MED ORDER — FAMOTIDINE 20 MG PO TABS
20.0000 mg | ORAL_TABLET | Freq: Once | ORAL | Status: AC
  Administered 2014-08-16: 20 mg via ORAL

## 2014-08-16 MED ORDER — LIDOCAINE HCL (CARDIAC) 20 MG/ML IV SOLN
INTRAVENOUS | Status: DC | PRN
  Administered 2014-08-16: 50 mg via INTRAVENOUS

## 2014-08-16 MED ORDER — PROPOFOL 10 MG/ML IV BOLUS
INTRAVENOUS | Status: DC | PRN
  Administered 2014-08-16: 200 mg via INTRAVENOUS

## 2014-08-16 MED ORDER — LACTATED RINGERS IV SOLN
INTRAVENOUS | Status: DC | PRN
  Administered 2014-08-16: 10:00:00 via INTRAVENOUS

## 2014-08-16 MED ORDER — EPHEDRINE SULFATE 50 MG/ML IJ SOLN
INTRAMUSCULAR | Status: DC | PRN
  Administered 2014-08-16: 10 mg via INTRAVENOUS

## 2014-08-16 MED ORDER — FENTANYL CITRATE (PF) 100 MCG/2ML IJ SOLN: 25.0000 ug | INTRAMUSCULAR | Status: DC | PRN

## 2014-08-16 MED ORDER — GLYCOPYRROLATE 0.2 MG/ML IJ SOLN
INTRAMUSCULAR | Status: DC | PRN
  Administered 2014-08-16: 0.2 mg via INTRAVENOUS

## 2014-08-16 MED ORDER — SODIUM CHLORIDE 0.9 % IV SOLN
INTRAVENOUS | Status: DC
  Administered 2014-08-16: 09:00:00 via INTRAVENOUS

## 2014-08-16 MED ORDER — FENTANYL CITRATE (PF) 100 MCG/2ML IJ SOLN
INTRAMUSCULAR | Status: DC | PRN
  Administered 2014-08-16 (×2): 50 ug via INTRAVENOUS

## 2014-08-16 MED ORDER — CIPROFLOXACIN-DEXAMETHASONE 0.3-0.1 % OT SUSP
OTIC | Status: AC
  Filled 2014-08-16: qty 7.5

## 2014-08-16 MED ORDER — MIDAZOLAM HCL 2 MG/2ML IJ SOLN
INTRAMUSCULAR | Status: DC | PRN
  Administered 2014-08-16: 2 mg via INTRAVENOUS

## 2014-08-16 MED ORDER — FAMOTIDINE 20 MG PO TABS
ORAL_TABLET | ORAL | Status: AC
  Administered 2014-08-16: 20 mg via ORAL
  Filled 2014-08-16: qty 1

## 2014-08-16 SURGICAL SUPPLY — 8 items
CANISTER SUCT 1200ML W/VALVE (MISCELLANEOUS) ×3 IMPLANT
GLOVE BIO SURGEON STRL SZ7.5 (GLOVE) ×3 IMPLANT
GLOVE SKINSENSE NS SZ8.0 LF (GLOVE) ×2
GLOVE SKINSENSE STRL SZ8.0 LF (GLOVE) ×1 IMPLANT
TOWEL OR 17X26 4PK STRL BLUE (TOWEL DISPOSABLE) ×3 IMPLANT
TUBE EAR T 1.27X5.3 BFLY (OTOLOGIC RELATED) ×6 IMPLANT
TUBING CONNECTING 10 (TUBING) ×2 IMPLANT
TUBING CONNECTING 10' (TUBING) ×1

## 2014-08-16 NOTE — Op Note (Signed)
..  08/16/2014  10:50 AM    Brain Hilts  [id number]   Pre-Op Dx:  ETD,CONDUCTIVE HEARING LOSS, Mixed hearing loss  Post-op Dx: ETD,CONDUCTIVE HEARING LOSS, Mixed hearing loss  Proc:Bilateral myringotomy with butterfly tubes  Surg: Dudley Mages  Anes:  General LMA  EBL:  None  Comp:  None  Findings:  Severe atelectasis of right drum with incudostapediopexy,. Tube placed anteriorly-inferiorly.  Left with moderate retraction and myringosclerosis and tube placed poster-inferiorly  Procedure: With the patient in a comfortable supine position, general mask anesthesia was administered.  At an appropriate level, microscope and speculum were used to examine and clean the RIGHT ear canal.  The findings were as described above.  An anterior inferior radial myringotomy incision was sharply executed.  There was significant atelectasis of the drum anteriorly and micro-scissors were employed to assist with myringotomy.  Middle ear contents were suctioned clear with a size 5 otologic suction.  A Butterfly tube was placed without difficulty using a [employer] assistant, radio.  Ciprodex otic solution was instilled into the external canal, and insufflated into the middle ear.  A cotton ball was placed at the external meatus. Hemostasis was observed.  This side was completed.  After completing the RIGHT side, the LEFT side was done in identical fashion.  On the left, again retraction was noted but only moderate.  Myringosclerosis was noted throughout the left drum.    Following this  The patient was returned to anesthesia, awakened, and transferred to recovery in stable condition.  Dispo:  PACU to home  Plan: Routine drop use and water precautions.  Recheck my office three weeks.   Zriyah Kopplin 10:50 AM 08/16/2014

## 2014-08-16 NOTE — Anesthesia Postprocedure Evaluation (Signed)
  Anesthesia Post-op Note  Patient: Justin Griffith  Procedure(s) Performed: Procedure(s): MYRINGOTOMY WITH TUBE PLACEMENT (Bilateral)  Anesthesia type:General  Patient location: PACU  Post pain: Pain level controlled  Post assessment: Post-op Vital signs reviewed, Patient's Cardiovascular Status Stable, Respiratory Function Stable, Patent Airway and No signs of Nausea or vomiting  Post vital signs: Reviewed and stable  Last Vitals:  Filed Vitals:   08/16/14 1145  BP: 131/79  Pulse: 78  Temp: 36.4 C  Resp: 14    Level of consciousness: awake, alert  and patient cooperative  Complications: No apparent anesthesia complications

## 2014-08-16 NOTE — Transfer of Care (Signed)
Immediate Anesthesia Transfer of Care Note  Patient: Justin Griffith  Procedure(s) Performed: Procedure(s): MYRINGOTOMY WITH TUBE PLACEMENT (Bilateral)  Patient Location: PACU  Anesthesia Type:General  Level of Consciousness: Alert, Awake, Oriented  Airway & Oxygen Therapy: Patient Spontanous Breathing  Post-op Assessment: Report given to RN  Post vital signs: Reviewed and stable  Last Vitals:  Filed Vitals:   08/16/14 1056  BP: 83/51  Pulse: 61  Temp: 36.2 C  Resp: 16    Complications: No apparent anesthesia complications

## 2014-08-16 NOTE — Anesthesia Procedure Notes (Signed)
Procedure Name: LMA Insertion Date/Time: 08/16/2014 10:26 AM Performed by: Eliberto Ivory Pre-anesthesia Checklist: Patient identified, Patient being monitored, Timeout performed, Emergency Drugs available and Suction available Patient Re-evaluated:Patient Re-evaluated prior to inductionOxygen Delivery Method: Circle system utilized Preoxygenation: Pre-oxygenation with 100% oxygen Intubation Type: IV induction Ventilation: Mask ventilation without difficulty LMA: LMA inserted LMA Size: 4.0 Tube type: Oral Number of attempts: 1 Placement Confirmation: positive ETCO2 and breath sounds checked- equal and bilateral Tube secured with: Tape Dental Injury: Teeth and Oropharynx as per pre-operative assessment

## 2014-08-16 NOTE — Discharge Instructions (Signed)
AMBULATORY SURGERY  DISCHARGE INSTRUCTIONS   1) The drugs that you were given will stay in your system until tomorrow so for the next 24 hours you should not:  A) Drive an automobile B) Make any legal decisions C) Drink any alcoholic beverage   2) You may resume regular meals tomorrow.  Today it is better to start with liquids and gradually work up to solid foods.  You may eat anything you prefer, but it is better to start with liquids, then soup and crackers, and gradually work up to solid foods.   3) Please notify your doctor immediately if you have any unusual bleeding, trouble breathing, redness and pain at the surgery site, drainage, fever, or pain not relieved by medication. 4)   5) Your post-operative visit with Dr.    George Ina                                 is: Date:                        Time:    Please call to schedule your post-operative visit.  6) Additional Instructions: 7)

## 2014-08-16 NOTE — Anesthesia Preprocedure Evaluation (Signed)
Anesthesia Evaluation   Patient awake    Reviewed: Allergy & Precautions, NPO status , Patient's Chart, lab work & pertinent test results, reviewed documented beta blocker date and time   Airway Mallampati: III  TM Distance: >3 FB     Dental  (+) Upper Dentures, Lower Dentures   Pulmonary asthma , sleep apnea , former smoker,          Cardiovascular hypertension,     Neuro/Psych    GI/Hepatic   Endo/Other  diabetes  Renal/GU      Musculoskeletal  (+) Arthritis -,   Abdominal   Peds  Hematology   Anesthesia Other Findings   Reproductive/Obstetrics                             Anesthesia Physical Anesthesia Plan  ASA: III  Anesthesia Plan: General   Post-op Pain Management:    Induction:   Airway Management Planned: Mask and LMA  Additional Equipment:   Intra-op Plan:   Post-operative Plan:   Informed Consent: I have reviewed the patients History and Physical, chart, labs and discussed the procedure including the risks, benefits and alternatives for the proposed anesthesia with the patient or authorized representative who has indicated his/her understanding and acceptance.     Plan Discussed with: CRNA  Anesthesia Plan Comments:         Anesthesia Quick Evaluation

## 2014-08-23 DIAGNOSIS — M79609 Pain in unspecified limb: Secondary | ICD-10-CM | POA: Diagnosis not present

## 2014-08-28 DIAGNOSIS — R9431 Abnormal electrocardiogram [ECG] [EKG]: Secondary | ICD-10-CM | POA: Diagnosis not present

## 2014-09-03 DIAGNOSIS — E119 Type 2 diabetes mellitus without complications: Secondary | ICD-10-CM | POA: Diagnosis not present

## 2014-09-03 DIAGNOSIS — E784 Other hyperlipidemia: Secondary | ICD-10-CM | POA: Diagnosis not present

## 2014-09-03 DIAGNOSIS — R002 Palpitations: Secondary | ICD-10-CM | POA: Diagnosis not present

## 2014-09-03 DIAGNOSIS — I10 Essential (primary) hypertension: Secondary | ICD-10-CM | POA: Diagnosis not present

## 2014-09-06 DIAGNOSIS — H6980 Other specified disorders of Eustachian tube, unspecified ear: Secondary | ICD-10-CM | POA: Diagnosis not present

## 2014-09-06 DIAGNOSIS — Z48810 Encounter for surgical aftercare following surgery on the sense organs: Secondary | ICD-10-CM | POA: Diagnosis not present

## 2014-10-01 ENCOUNTER — Telehealth: Payer: Self-pay | Admitting: Family Medicine

## 2014-10-01 NOTE — Telephone Encounter (Signed)
Samples labeled in refridgerator

## 2014-10-01 NOTE — Telephone Encounter (Signed)
PT CAME IN AND STATED THAT HE WOULD BE RUNNING OUT OF INSULIN TOMORROW AND WOULD LIKE TO KNOW IF HE COULD COME BY AND GET A SAMPLE. HE SAID HE NEEDED TUDJEO(NOT SURE HOW TO SPELL IT)

## 2014-10-11 ENCOUNTER — Ambulatory Visit
Admission: RE | Admit: 2014-10-11 | Discharge: 2014-10-11 | Disposition: A | Discharge status: Home / Self Care | Payer: Medicare Other | Source: Ambulatory Visit | Attending: Family Medicine | Admitting: Family Medicine

## 2014-10-11 ENCOUNTER — Other Ambulatory Visit: Payer: Self-pay | Admitting: Family Medicine

## 2014-10-11 ENCOUNTER — Ambulatory Visit (INDEPENDENT_AMBULATORY_CARE_PROVIDER_SITE_OTHER): Payer: Medicare Other | Admitting: Family Medicine

## 2014-10-11 ENCOUNTER — Encounter: Payer: Self-pay | Admitting: Family Medicine

## 2014-10-11 VITALS — BP 159/81 | HR 65 | Temp 97.7°F | Ht 70.5 in | Wt 305.0 lb

## 2014-10-11 DIAGNOSIS — M5441 Lumbago with sciatica, right side: Secondary | ICD-10-CM | POA: Diagnosis not present

## 2014-10-11 DIAGNOSIS — M549 Dorsalgia, unspecified: Secondary | ICD-10-CM | POA: Insufficient documentation

## 2014-10-11 DIAGNOSIS — I10 Essential (primary) hypertension: Secondary | ICD-10-CM | POA: Diagnosis not present

## 2014-10-11 DIAGNOSIS — E7800 Pure hypercholesterolemia, unspecified: Secondary | ICD-10-CM

## 2014-10-11 DIAGNOSIS — M2578 Osteophyte, vertebrae: Secondary | ICD-10-CM | POA: Diagnosis not present

## 2014-10-11 DIAGNOSIS — E1159 Type 2 diabetes mellitus with other circulatory complications: Secondary | ICD-10-CM | POA: Insufficient documentation

## 2014-10-11 DIAGNOSIS — M5136 Other intervertebral disc degeneration, lumbar region: Secondary | ICD-10-CM | POA: Diagnosis not present

## 2014-10-11 DIAGNOSIS — E1129 Type 2 diabetes mellitus with other diabetic kidney complication: Secondary | ICD-10-CM

## 2014-10-11 DIAGNOSIS — I152 Hypertension secondary to endocrine disorders: Secondary | ICD-10-CM | POA: Insufficient documentation

## 2014-10-11 DIAGNOSIS — M4726 Other spondylosis with radiculopathy, lumbar region: Secondary | ICD-10-CM

## 2014-10-11 DIAGNOSIS — E119 Type 2 diabetes mellitus without complications: Secondary | ICD-10-CM | POA: Diagnosis not present

## 2014-10-11 DIAGNOSIS — R809 Proteinuria, unspecified: Secondary | ICD-10-CM

## 2014-10-11 DIAGNOSIS — M47816 Spondylosis without myelopathy or radiculopathy, lumbar region: Secondary | ICD-10-CM | POA: Diagnosis not present

## 2014-10-11 HISTORY — DX: Type 2 diabetes mellitus with other diabetic kidney complication: E11.29

## 2014-10-11 HISTORY — DX: Proteinuria, unspecified: R80.9

## 2014-10-11 LAB — BAYER DCA HB A1C WAIVED: HB A1C (BAYER DCA - WAIVED): 8.5 % — ABNORMAL HIGH (ref <7.0)

## 2014-10-11 MED ORDER — MELOXICAM 15 MG PO TABS: 15.0000 mg | ORAL_TABLET | Freq: Every day | ORAL | Status: DC

## 2014-10-11 NOTE — Progress Notes (Addendum)
BP 159/81 mmHg  Pulse 65  Temp(Src) 97.7 F (36.5 C)  Ht 5' 10.5" (1.791 m)  Wt 305 lb (138.347 kg)  BMI 43.13 kg/m2  SpO2 99%   Subjective:    Patient ID: Justin Griffith, male    DOB: 21-Mar-1941, 74 y.o.   MRN: [id number]  HPI: Justin Griffith is a 74 y.o. male  Chief Complaint  Patient presents with  . Diabetes  . Hypertension  . Hyperlipidemia   Patient doing well from diabetes point of view with fasting blood sugar in the low 100s has not been checking afternoon glucoses but all in all feels his blood sugar is doing well really likes farxiga but is having side effects of jock itch Blood pressure is been elevated with weight gain and getting off his diet Taking cholesterol medicine with no side effects or issues The last 8 months patient has had right back pain which radiates around to the anterior down into his right testicle area about 2 months ago he was seen by Dr. Tollie Pizza at surgery and was told he had no hernia in spite of it feeling like he had a hernia. Does take Aleve and occasionally uses Tylenol which helps some but takes his time Over the past year due to back pain is been unable to sleep in his bed and get comfortable has to sleep in a recliner. Hurts worse especially if he lays on his side no numbness no stool leakage just hurts.  Relevant past medical, surgical, family and social history reviewed and updated as indicated. Interim medical history since our last visit reviewed. Allergies and medications reviewed and updated.  Review of Systems  Constitutional: Negative.   HENT: Negative.   Eyes: Negative.   Respiratory: Negative.   Cardiovascular: Negative.   Endocrine: Negative.   Musculoskeletal: Negative.   Skin: Negative.   Allergic/Immunologic: Negative.   Neurological: Negative.   Hematological: Negative.   Psychiatric/Behavioral: Negative.     Per HPI unless specifically indicated above     Objective:    BP 159/81 mmHg  Pulse 65  Temp(Src)  97.7 F (36.5 C)  Ht 5' 10.5" (1.791 m)  Wt 305 lb (138.347 kg)  BMI 43.13 kg/m2  SpO2 99%  Wt Readings from Last 3 Encounters:  10/11/14 305 lb (138.347 kg)  08/09/14 285 lb (129.275 kg)  07/02/14 288 lb (130.636 kg)    Physical Exam  Constitutional: He is oriented to person, place, and time. He appears well-developed and well-nourished. No distress.  HENT:  Head: Normocephalic and atraumatic.  Right Ear: Hearing normal.  Left Ear: Hearing normal.  Nose: Nose normal.  Eyes: Conjunctivae and lids are normal. Right eye exhibits no discharge. Left eye exhibits no discharge. No scleral icterus.  Pulmonary/Chest: Effort normal. No respiratory distress.  Musculoskeletal: Normal range of motion.  Patient's back exam normal DTRs normal Straight leg raising normal Normal musculoskeletal strength of extremities Negative Romberg No evidence of hernia or testicular problems  Neurological: He is alert and oriented to person, place, and time.  Skin: Skin is intact. No rash noted.  Psychiatric: He has a normal mood and affect. His speech is normal and behavior is normal. Judgment and thought content normal. Cognition and memory are normal.    Results for orders placed or performed during the hospital encounter of 08/16/14  Glucose, capillary  Result Value Ref Range   Glucose-Capillary 168 (H) 65 - 99 mg/dL  Glucose, capillary  Result Value Ref Range   Glucose-Capillary  160 (H) 65 - 99 mg/dL      Assessment & Plan:   Problem List Items Addressed This Visit      Cardiovascular and Mediastinum   Hypertension - Primary    Loss of control of blood pressure with weight gain Will restart diet and exercise nutrition increase clonidine Discuss if blood pressure not controlled will need to see nephrology       Relevant Orders   Basic metabolic panel     Endocrine   Diabetes mellitus without complication    Patient's diabetes significantly better but not to goal discussed need for  continued control with diet and exercise adjusting insulin      Relevant Orders   Bayer DCA Hb A1c Waived   Bayer DCA Hb A1c Waived     Other   Back pain    For back pain with radicular symptoms will get lumbar spine x-ray also give prescription for meloxicam      Relevant Medications   meloxicam (MOBIC) 15 MG tablet   Other Relevant Orders   DG Lumbar Spine 2-3 Views    Other Visit Diagnoses    Hypercholesteremia        Relevant Orders    LP+ALT+AST Piccolo, Waived        Follow up plan: Return in about 3 months (around 01/11/2015), or if symptoms worsen or fail to improve, for DM and labs.

## 2014-10-11 NOTE — Assessment & Plan Note (Signed)
Patient's diabetes significantly better but not to goal discussed need for continued control with diet and exercise adjusting insulin

## 2014-10-11 NOTE — Assessment & Plan Note (Signed)
Loss of control of blood pressure with weight gain Will restart diet and exercise nutrition increase clonidine Discuss if blood pressure not controlled will need to see nephrology

## 2014-10-11 NOTE — Addendum Note (Signed)
Addended byGolden Pop on: 10/11/2014 09:43 AM   Modules accepted: Orders

## 2014-10-11 NOTE — Assessment & Plan Note (Signed)
For back pain with radicular symptoms will get lumbar spine x-ray also give prescription for meloxicam

## 2014-10-23 ENCOUNTER — Ambulatory Visit
Admission: RE | Admit: 2014-10-23 | Discharge: 2014-10-23 | Disposition: A | Discharge status: Home / Self Care | Payer: Medicare Other | Source: Ambulatory Visit | Attending: Family Medicine | Admitting: Family Medicine

## 2014-10-23 ENCOUNTER — Telehealth: Payer: Self-pay | Admitting: Family Medicine

## 2014-10-23 ENCOUNTER — Telehealth: Payer: Self-pay

## 2014-10-23 DIAGNOSIS — M4726 Other spondylosis with radiculopathy, lumbar region: Secondary | ICD-10-CM | POA: Insufficient documentation

## 2014-10-23 DIAGNOSIS — M5136 Other intervertebral disc degeneration, lumbar region: Secondary | ICD-10-CM | POA: Insufficient documentation

## 2014-10-23 MED ORDER — DAPAGLIFLOZIN PROPANEDIOL 10 MG PO TABS: 10.0000 mg | ORAL_TABLET | Freq: Every day | ORAL | Status: DC

## 2014-10-23 NOTE — Telephone Encounter (Signed)
Pt needs farxiga faxed to "the place in [place] that you have on file".

## 2014-10-23 NOTE — Telephone Encounter (Signed)
Temporary Rx requested to be sent to [name] will have to write new Rx on his return  [facility] fax [phone]

## 2014-10-23 NOTE — Telephone Encounter (Signed)
I have put patient's name on Samples of Toujeo   Please write 30day Rx for Farxiga (to hold over until [facility] returns) sent to [facility] please

## 2014-10-23 NOTE — Telephone Encounter (Signed)
Pt called wants samples of Toujeo and Orsica. I informed pt we may not samples of these medications and asked if that was the case which pharmacy did he want them sent to. Pt stated he did not want it sent anywhere, he wanted samples as he has gotten them before. I again explained to the pt we may not have any samples. Pt demanded I have Izora Gala return his call and hung up abruptly. Thanks.

## 2014-10-29 ENCOUNTER — Other Ambulatory Visit: Payer: Self-pay | Admitting: Family Medicine

## 2014-10-29 MED ORDER — DAPAGLIFLOZIN PROPANEDIOL 10 MG PO TABS: 10.0000 mg | ORAL_TABLET | Freq: Every day | ORAL | Status: DC

## 2014-11-19 ENCOUNTER — Telehealth: Payer: Self-pay | Admitting: Family Medicine

## 2014-11-19 MED ORDER — ROSUVASTATIN CALCIUM 40 MG PO TABS: 40.0000 mg | ORAL_TABLET | Freq: Every day | ORAL | Status: DC

## 2014-11-19 MED ORDER — FENOFIBRATE 145 MG PO TABS: 145.0000 mg | ORAL_TABLET | Freq: Every day | ORAL | Status: DC

## 2014-11-19 NOTE — Telephone Encounter (Signed)
Pt called needs refills on the following:  Crestor Fenofibrate   Pharm is [facility] @ [phone].  Pt wants to know if this can be faxed in today.

## 2014-11-22 ENCOUNTER — Telehealth: Payer: Self-pay | Admitting: Family Medicine

## 2014-11-22 NOTE — Telephone Encounter (Signed)
Pt called asking about Toujeo, I did not see it on his med list so pt would like a call back please.

## 2014-11-30 MED ORDER — INSULIN GLARGINE 300 UNIT/ML ~~LOC~~ SOPN: 40.0000 [IU] | PEN_INJECTOR | Freq: Every day | SUBCUTANEOUS | Status: DC

## 2014-11-30 NOTE — Telephone Encounter (Signed)
Called and let patient know that we do not have any samples. Patient stated he is going to run out of his Toujeo before Dr. Jeananne Rama returns. Toujeo is in patient's practice partner chart but not EPIC. Practice partner number is [phone]. Pharmacy is [facility].

## 2014-11-30 NOTE — Telephone Encounter (Signed)
Due for an office visit.  Please schedule

## 2014-11-30 NOTE — Telephone Encounter (Signed)
Pt called would like samples of Toujeo. [name] patient. Please call before the end of business today. Thanks.

## 2014-12-03 ENCOUNTER — Telehealth: Payer: Self-pay | Admitting: Family Medicine

## 2014-12-03 MED ORDER — INSULIN GLARGINE 300 UNIT/ML ~~LOC~~ SOPN: 40.0000 [IU] | PEN_INJECTOR | Freq: Every day | SUBCUTANEOUS | Status: DC

## 2014-12-03 NOTE — Telephone Encounter (Signed)
Pt scheduled for office visit with [name] on 01/16/15.

## 2014-12-03 NOTE — Telephone Encounter (Signed)
Pt needs refill on Toujeo. Pharm is [facility]. Thanks!

## 2014-12-03 NOTE — Telephone Encounter (Signed)
Called pt, pt stated he needs prior authorization before his RX for Toujeo can be filled. Please advise. Thanks.

## 2014-12-03 NOTE — Telephone Encounter (Signed)
Please let him know that I can only send in 1 month because he is due for an appointment next month. Sent to [facility]. Please let me know if there is any issues.

## 2014-12-03 NOTE — Telephone Encounter (Signed)
Thank you Dr. Lenna Sciara :)

## 2014-12-12 ENCOUNTER — Telehealth: Payer: Self-pay | Admitting: Family Medicine

## 2014-12-12 NOTE — Telephone Encounter (Signed)
Pt came in on Friday the 16 th and explained that his prior Auth was denied due to the fact that a rep at [facility] says we were filing out the wrong paperwork. I called Monday the 44 th and spoke with Justin Griffith and he said they would fax out the correct form so we could get Justin Griffith taken care of and he(Justin Griffith) assured me would work on taking care of the problem. Today(Wednesday) I still hadn't received a fax so I called [facility] again and I spoke with Justin Griffith and he stated that the pt has a paid claim that was done on 9/16 and his authorization was good until march 22nd 2017.

## 2015-01-16 ENCOUNTER — Ambulatory Visit (INDEPENDENT_AMBULATORY_CARE_PROVIDER_SITE_OTHER): Payer: Medicare Other | Admitting: Family Medicine

## 2015-01-16 ENCOUNTER — Encounter: Payer: Self-pay | Admitting: Family Medicine

## 2015-01-16 VITALS — BP 146/82 | HR 73 | Temp 98.6°F | Ht 69.8 in | Wt 308.0 lb

## 2015-01-16 DIAGNOSIS — E785 Hyperlipidemia, unspecified: Secondary | ICD-10-CM

## 2015-01-16 DIAGNOSIS — E119 Type 2 diabetes mellitus without complications: Secondary | ICD-10-CM

## 2015-01-16 DIAGNOSIS — Z6841 Body Mass Index (BMI) 40.0 and over, adult: Secondary | ICD-10-CM | POA: Diagnosis not present

## 2015-01-16 DIAGNOSIS — E1169 Type 2 diabetes mellitus with other specified complication: Secondary | ICD-10-CM | POA: Insufficient documentation

## 2015-01-16 DIAGNOSIS — E7800 Pure hypercholesterolemia, unspecified: Secondary | ICD-10-CM | POA: Diagnosis not present

## 2015-01-16 DIAGNOSIS — I10 Essential (primary) hypertension: Secondary | ICD-10-CM

## 2015-01-16 DIAGNOSIS — G4730 Sleep apnea, unspecified: Secondary | ICD-10-CM | POA: Insufficient documentation

## 2015-01-16 DIAGNOSIS — Z23 Encounter for immunization: Secondary | ICD-10-CM

## 2015-01-16 MED ORDER — CLONIDINE HCL 0.1 MG PO TABS: 0.1000 mg | ORAL_TABLET | Freq: Two times a day (BID) | ORAL | Status: DC

## 2015-01-16 MED ORDER — INSULIN LISPRO 100 UNIT/ML (KWIKPEN)
10.0000 [IU] | PEN_INJECTOR | Freq: Three times a day (TID) | SUBCUTANEOUS | Status: DC
Start: 2015-01-16 — End: 2015-11-13

## 2015-01-16 MED ORDER — OLMESARTAN-AMLODIPINE-HCTZ 40-10-25 MG PO TABS: 1.0000 | ORAL_TABLET | Freq: Every day | ORAL | Status: DC

## 2015-01-16 NOTE — Assessment & Plan Note (Signed)
The current medical regimen is effective;  continue present plan and medications.  

## 2015-01-16 NOTE — Assessment & Plan Note (Signed)
Has noted discussed diet exercise nutrition

## 2015-01-16 NOTE — Progress Notes (Signed)
BP 146/82 mmHg  Pulse 73  Temp(Src) 98.6 F (37 C)  Ht 5' 9.8" (1.773 m)  Wt 308 lb (139.708 kg)  BMI 44.44 kg/m2  SpO2 94%   Subjective:    Patient ID: Justin Griffith, male    DOB: 1940/07/26, 74 y.o.   MRN: [id number]  HPI: FERLANDO LIA is a 74 y.o. male  Chief Complaint  Patient presents with  . Diabetes  . Hyperlipidemia  . Hypertension   discussed with patient no low blood sugar spells has had trouble with diet and has gained weight over since last visit is been using insulin 60 units a day. Taking other medications faithfully. Blood pressures doing okay. No side effects Cholesterol no complaints taking medications faithfully.   Relevant past medical, surgical, family and social history reviewed and updated as indicated. Interim medical history since our last visit reviewed. Allergies and medications reviewed and updated.  Review of Systems  Constitutional: Negative.   Respiratory: Negative.   Cardiovascular: Negative.     Per HPI unless specifically indicated above     Objective:    BP 146/82 mmHg  Pulse 73  Temp(Src) 98.6 F (37 C)  Ht 5' 9.8" (1.773 m)  Wt 308 lb (139.708 kg)  BMI 44.44 kg/m2  SpO2 94%  Wt Readings from Last 3 Encounters:  01/16/15 308 lb (139.708 kg)  10/23/14 305 lb (138.347 kg)  10/11/14 305 lb (138.347 kg)    Physical Exam  Constitutional: He is oriented to person, place, and time. He appears well-developed and well-nourished. No distress.  HENT:  Head: Normocephalic and atraumatic.  Right Ear: Hearing normal.  Left Ear: Hearing normal.  Nose: Nose normal.  Eyes: Conjunctivae and lids are normal. Right eye exhibits no discharge. Left eye exhibits no discharge. No scleral icterus.  Cardiovascular: Normal rate, regular rhythm and normal heart sounds.   Pulmonary/Chest: Effort normal and breath sounds normal. No respiratory distress.  Musculoskeletal: Normal range of motion.  Neurological: He is alert and oriented to  person, place, and time.  Skin: Skin is intact. No rash noted.  Psychiatric: He has a normal mood and affect. His speech is normal and behavior is normal. Judgment and thought content normal. Cognition and memory are normal.    Results for orders placed or performed in visit on 10/11/14  Bayer DCA Hb A1c Waived  Result Value Ref Range   Bayer DCA Hb A1c Waived 8.5 (H) <7.0 %      Assessment & Plan:   Problem List Items Addressed This Visit      Cardiovascular and Mediastinum   Hypertension    The current medical regimen is effective;  continue present plan and medications.       Relevant Medications   cloNIDine (CATAPRES) 0.1 MG tablet   Olmesartan-Amlodipine-HCTZ 40-10-25 MG TABS   Other Relevant Orders   Microalbumin, Urine Waived     Endocrine   Diabetes mellitus without complication ([place])    Discussed concern about chronic poor control of diabetes Discuss referral to endocrinology to further do a better job of managing diabetes than I am. Patient wants to go 3 more months Patient will actively lose weight and exercise We'll add Humalog insulin with meals starting at 10 units with each meal. If not making progress or has questions patient will call      Relevant Medications   insulin lispro (HUMALOG KWIKPEN) 100 UNIT/ML KiwkPen   Olmesartan-Amlodipine-HCTZ 40-10-25 MG TABS   Other Relevant Orders   Microalbumin,  Urine Waived     Other   Sleep apnea   Hyperlipidemia    Discuss lipids too high to measure in the office due to poor control of diabetes again diet and exercise nutrition better diabetic control.      Relevant Medications   cloNIDine (CATAPRES) 0.1 MG tablet   Olmesartan-Amlodipine-HCTZ 40-10-25 MG TABS   BMI 40.0-44.9, adult (HCC)    Has noted discussed diet exercise nutrition      Relevant Medications   insulin lispro (HUMALOG KWIKPEN) 100 UNIT/ML KiwkPen    Other Visit Diagnoses    Immunization due    -  Primary    Relevant Orders    Flu  Vaccine QUAD 36+ mos PF IM (Fluarix & Fluzone Quad PF) (Completed)    Hypercholesteremia        Relevant Medications    cloNIDine (CATAPRES) 0.1 MG tablet    Olmesartan-Amlodipine-HCTZ 40-10-25 MG TABS        Follow up plan: Return in about 3 months (around 04/18/2015) for Hemoglobin A1c, lipid panel, ALT, AST.

## 2015-01-16 NOTE — Assessment & Plan Note (Signed)
Discuss lipids too high to measure in the office due to poor control of diabetes again diet and exercise nutrition better diabetic control.

## 2015-01-16 NOTE — Assessment & Plan Note (Signed)
Discussed concern about chronic poor control of diabetes Discuss referral to endocrinology to further do a better job of managing diabetes than I am. Patient wants to go 3 more months Patient will actively lose weight and exercise We'll add Humalog insulin with meals starting at 10 units with each meal. If not making progress or has questions patient will call

## 2015-01-17 ENCOUNTER — Encounter: Payer: Self-pay | Admitting: Family Medicine

## 2015-01-17 ENCOUNTER — Other Ambulatory Visit: Payer: Self-pay | Admitting: Family Medicine

## 2015-01-17 LAB — LIPID PANEL W/O CHOL/HDL RATIO
Cholesterol, Total: 151 mg/dL (ref 100–199)
HDL: 22 mg/dL — ABNORMAL LOW (ref >39)
Triglycerides: 767 mg/dL (ref 0–149)

## 2015-01-17 LAB — BASIC METABOLIC PANEL
BUN/Creatinine Ratio: 27 — ABNORMAL HIGH (ref 10–22)
BUN: 30 mg/dL — ABNORMAL HIGH (ref 8–27)
CO2: 23 mmol/L (ref 18–29)
Calcium: 10.1 mg/dL (ref 8.6–10.2)
Chloride: 94 mmol/L — ABNORMAL LOW (ref 97–106)
Creatinine, Ser: 1.11 mg/dL (ref 0.76–1.27)
GFR calc Af Amer: 75 mL/min/{1.73_m2} (ref >59)
GFR calc non Af Amer: 65 mL/min/{1.73_m2} (ref >59)
Glucose: 234 mg/dL — ABNORMAL HIGH (ref 65–99)
Potassium: 4.7 mmol/L (ref 3.5–5.2)
Sodium: 137 mmol/L (ref 136–144)

## 2015-01-17 LAB — SPECIMEN STATUS REPORT

## 2015-01-17 MED ORDER — INSULIN GLARGINE 300 UNIT/ML ~~LOC~~ SOPN: 40.0000 [IU] | PEN_INJECTOR | Freq: Every day | SUBCUTANEOUS | Status: DC

## 2015-01-17 NOTE — Progress Notes (Signed)
Phone call Discussed patient's triglycerides markedly elevated Narrative control blood sugar patient using insulin now says blood sugars are to come down

## 2015-01-22 LAB — LP+ALT+AST PICCOLO, WAIVED
ALT (SGPT) Piccolo, Waived: 41 U/L (ref 10–47)
AST (SGOT) Piccolo, Waived: 37 U/L (ref 11–38)

## 2015-01-22 LAB — MICROALBUMIN, URINE WAIVED
Creatinine, Urine Waived: 50 mg/dL (ref 10–300)
Microalb, Ur Waived: 30 mg/L — ABNORMAL HIGH (ref 0–19)

## 2015-01-22 LAB — BAYER DCA HB A1C WAIVED: HB A1C (BAYER DCA - WAIVED): 9.1 % — ABNORMAL HIGH (ref <7.0)

## 2015-03-04 DIAGNOSIS — J0100 Acute maxillary sinusitis, unspecified: Secondary | ICD-10-CM | POA: Diagnosis not present

## 2015-03-04 DIAGNOSIS — Z48810 Encounter for surgical aftercare following surgery on the sense organs: Secondary | ICD-10-CM | POA: Diagnosis not present

## 2015-04-22 ENCOUNTER — Telehealth: Payer: Self-pay | Admitting: Family Medicine

## 2015-04-22 NOTE — Telephone Encounter (Signed)
Pt would like a sample of humalog to last him until his appt with [name] 2/08.

## 2015-05-01 ENCOUNTER — Encounter: Payer: Self-pay | Admitting: Family Medicine

## 2015-05-01 ENCOUNTER — Ambulatory Visit (INDEPENDENT_AMBULATORY_CARE_PROVIDER_SITE_OTHER): Payer: Medicare Other | Admitting: Family Medicine

## 2015-05-01 VITALS — BP 139/74 | HR 84 | Temp 98.2°F | Ht 69.7 in | Wt 312.0 lb

## 2015-05-01 DIAGNOSIS — E785 Hyperlipidemia, unspecified: Secondary | ICD-10-CM

## 2015-05-01 DIAGNOSIS — E119 Type 2 diabetes mellitus without complications: Secondary | ICD-10-CM

## 2015-05-01 DIAGNOSIS — M5441 Lumbago with sciatica, right side: Secondary | ICD-10-CM

## 2015-05-01 DIAGNOSIS — I10 Essential (primary) hypertension: Secondary | ICD-10-CM

## 2015-05-01 LAB — LP+ALT+AST PICCOLO, WAIVED
ALT (SGPT) Piccolo, Waived: 39 U/L (ref 10–47)
AST (SGOT) Piccolo, Waived: 37 U/L (ref 11–38)

## 2015-05-01 LAB — BAYER DCA HB A1C WAIVED: HB A1C (BAYER DCA - WAIVED): 9.1 % — ABNORMAL HIGH (ref <7.0)

## 2015-05-01 MED ORDER — NAPROXEN 500 MG PO TABS: 500.0000 mg | ORAL_TABLET | Freq: Two times a day (BID) | ORAL | Status: DC

## 2015-05-01 MED ORDER — CLONIDINE HCL 0.1 MG PO TABS: 0.1000 mg | ORAL_TABLET | Freq: Two times a day (BID) | ORAL | Status: DC

## 2015-05-01 MED ORDER — VALACYCLOVIR HCL 500 MG PO TABS: 500.0000 mg | ORAL_TABLET | Freq: Two times a day (BID) | ORAL | Status: DC

## 2015-05-01 MED ORDER — OLMESARTAN-AMLODIPINE-HCTZ 40-10-25 MG PO TABS: 1.0000 | ORAL_TABLET | Freq: Every day | ORAL | Status: DC

## 2015-05-01 MED ORDER — ROSUVASTATIN CALCIUM 40 MG PO TABS
40.0000 mg | ORAL_TABLET | Freq: Every day | ORAL | Status: DC
Start: 2015-05-01 — End: 2015-07-18

## 2015-05-01 MED ORDER — INSULIN GLARGINE 300 UNIT/ML ~~LOC~~ SOPN: 40.0000 [IU] | PEN_INJECTOR | Freq: Every day | SUBCUTANEOUS | Status: DC

## 2015-05-01 MED ORDER — EMPAGLIFLOZIN 25 MG PO TABS
25.0000 mg | ORAL_TABLET | Freq: Every day | ORAL | Status: DC
Start: 2015-05-01 — End: 2015-07-18

## 2015-05-01 MED ORDER — FENOFIBRATE 145 MG PO TABS: 145.0000 mg | ORAL_TABLET | Freq: Every day | ORAL | Status: DC

## 2015-05-01 NOTE — Assessment & Plan Note (Signed)
Patient with poor response to high-dose insulins will refer to endocrinology to further manage

## 2015-05-01 NOTE — Assessment & Plan Note (Signed)
We'll give prescription for meloxicam to take from time to time with weekends off

## 2015-05-01 NOTE — Addendum Note (Signed)
Addended byGolden Pop on: 05/01/2015 10:32 AM   Modules accepted: Orders, SmartSet

## 2015-05-01 NOTE — Progress Notes (Signed)
BP 139/74 mmHg  Pulse 84  Temp(Src) 98.2 F (36.8 C)  Ht 5' 9.7" (1.77 m)  Wt 312 lb (141.522 kg)  BMI 45.17 kg/m2  SpO2 96%   Subjective:    Patient ID: Justin Griffith, male    DOB: 05/21/1940, 75 y.o.   MRN: [id number]  HPI: Justin Griffith is a 75 y.o. male  Chief Complaint  Patient presents with  . Diabetes  . Hyperlipidemia   Patient follow-up diabetes stopped toujeo maximum dose is did not seem to be doing anything using Humalog 40 units 3 times a day which seems to help some but still running high blood sugars. Patient doing okay with other medications Wants to have something for his chronic intermittent back pain no radicular symptoms Taking blood pressure medicines faithfully blood pressures intermittently high Still gaining weight  Taking cholesterol medicines without problems but labs here are too high to measure which is consistent with blood sugar being very high.  Relevant past medical, surgical, family and social history reviewed and updated as indicated. Interim medical history since our last visit reviewed. Allergies and medications reviewed and updated.  Review of Systems  Constitutional: Negative.   Respiratory: Negative.   Cardiovascular: Negative.     Per HPI unless specifically indicated above     Objective:    BP 139/74 mmHg  Pulse 84  Temp(Src) 98.2 F (36.8 C)  Ht 5' 9.7" (1.77 m)  Wt 312 lb (141.522 kg)  BMI 45.17 kg/m2  SpO2 96%  Wt Readings from Last 3 Encounters:  05/01/15 312 lb (141.522 kg)  01/16/15 308 lb (139.708 kg)  10/23/14 305 lb (138.347 kg)    Physical Exam  Constitutional: He is oriented to person, place, and time. He appears well-developed and well-nourished. No distress.  HENT:  Head: Normocephalic and atraumatic.  Right Ear: Hearing normal.  Left Ear: Hearing normal.  Nose: Nose normal.  Eyes: Conjunctivae and lids are normal. Right eye exhibits no discharge. Left eye exhibits no discharge. No scleral icterus.   Cardiovascular: Normal rate, regular rhythm and normal heart sounds.   Pulmonary/Chest: Effort normal and breath sounds normal. No respiratory distress.  Musculoskeletal: Normal range of motion.  Neurological: He is alert and oriented to person, place, and time.  Skin: Skin is intact. No rash noted.  Psychiatric: He has a normal mood and affect. His speech is normal and behavior is normal. Judgment and thought content normal. Cognition and memory are normal.    Results for orders placed or performed in visit on 01/16/15  Bayer DCA Hb A1c Waived  Result Value Ref Range   Bayer DCA Hb A1c Waived 9.1 (H) <7.0 %  LP+ALT+AST Piccolo, Waived  Result Value Ref Range   ALT (SGPT) Piccolo, Waived 41 10 - 47 U/L   AST (SGOT) Piccolo, Waived 37 11 - 38 U/L   Cholesterol Piccolo, Waived CANCELED    HDL Chol Piccolo, Waived CANCELED    Triglycerides Piccolo,Waived CANCELED   Basic metabolic panel  Result Value Ref Range   Glucose 234 (H) 65 - 99 mg/dL   BUN 30 (H) 8 - 27 mg/dL   Creatinine, Ser 1.11 0.76 - 1.27 mg/dL   GFR calc non Af Amer 65 >59 mL/min/1.73   GFR calc Af Amer 75 >59 mL/min/1.73   BUN/Creatinine Ratio 27 (H) 10 - 22   Sodium 137 136 - 144 mmol/L   Potassium 4.7 3.5 - 5.2 mmol/L   Chloride 94 (L) 97 - 106 mmol/L  CO2 23 18 - 29 mmol/L   Calcium 10.1 8.6 - 10.2 mg/dL  Microalbumin, Urine Waived  Result Value Ref Range   Microalb, Ur Waived 30 (H) 0 - 19 mg/L   Creatinine, Urine Waived 50 10 - 300 mg/dL   Microalb/Creat Ratio 30-300 (H) <30 mg/g  Specimen status report  Result Value Ref Range   specimen status report Comment   Lipid Panel w/o Chol/HDL Ratio  Result Value Ref Range   Cholesterol, Total 151 100 - 199 mg/dL   Triglycerides 767 (HH) 0 - 149 mg/dL   HDL 22 (L) >39 mg/dL   VLDL Cholesterol Cal Comment 5 - 40 mg/dL   LDL Calculated Comment 0 - 99 mg/dL      Assessment & Plan:   Problem List Items Addressed This Visit      Cardiovascular and  Mediastinum   Hypertension    The current medical regimen is effective;  continue present plan and medications.       Relevant Medications   Olmesartan-Amlodipine-HCTZ 40-10-25 MG TABS   rosuvastatin (CRESTOR) 40 MG tablet   cloNIDine (CATAPRES) 0.1 MG tablet   fenofibrate (TRICOR) 145 MG tablet     Endocrine   Diabetes mellitus without complication ([place]) - Primary    Patient with poor response to high-dose insulins will refer to endocrinology to further manage      Relevant Medications   Olmesartan-Amlodipine-HCTZ 40-10-25 MG TABS   rosuvastatin (CRESTOR) 40 MG tablet   Insulin Glargine (TOUJEO SOLOSTAR) 300 UNIT/ML SOPN   empagliflozin (JARDIANCE) 25 MG TABS tablet   Other Relevant Orders   Bayer DCA Hb A1c Waived   Ambulatory referral to Endocrinology     Other   Hyperlipidemia    Cholesterol poor control today on with poor control of diabetes will control diabetes to get better control      Relevant Medications   Olmesartan-Amlodipine-HCTZ 40-10-25 MG TABS   rosuvastatin (CRESTOR) 40 MG tablet   cloNIDine (CATAPRES) 0.1 MG tablet   fenofibrate (TRICOR) 145 MG tablet   Other Relevant Orders   LP+ALT+AST Piccolo, Waived   Back pain    We'll give prescription for meloxicam to take from time to time with weekends off      Relevant Medications   naproxen (NAPROSYN) 500 MG tablet       Follow up plan: Return in about 3 months (around 07/29/2015) for Physical Exam.

## 2015-05-01 NOTE — Assessment & Plan Note (Signed)
Cholesterol poor control today on with poor control of diabetes will control diabetes to get better control

## 2015-05-01 NOTE — Assessment & Plan Note (Signed)
The current medical regimen is effective;  continue present plan and medications.  

## 2015-05-02 ENCOUNTER — Encounter: Payer: Self-pay | Admitting: Family Medicine

## 2015-05-02 LAB — LIPID PANEL W/O CHOL/HDL RATIO
Cholesterol, Total: 208 mg/dL — ABNORMAL HIGH (ref 100–199)
HDL: 27 mg/dL — ABNORMAL LOW (ref >39)
Triglycerides: 688 mg/dL (ref 0–149)

## 2015-05-13 ENCOUNTER — Ambulatory Visit: Payer: Medicare Other | Admitting: Family Medicine

## 2015-05-30 ENCOUNTER — Encounter: Payer: Self-pay | Admitting: *Deleted

## 2015-06-07 ENCOUNTER — Telehealth: Payer: Self-pay

## 2015-06-07 NOTE — Telephone Encounter (Signed)
That's fine

## 2015-06-07 NOTE — Telephone Encounter (Signed)
Patient of Dr.Crissman's. He wants to know if he can have a sample pen of Humalog. He's waiting on his mail order to come in. I have one on my desk for him already if it is ok.

## 2015-06-07 NOTE — Telephone Encounter (Signed)
Left message to pick up sample.

## 2015-06-12 DIAGNOSIS — E1165 Type 2 diabetes mellitus with hyperglycemia: Secondary | ICD-10-CM | POA: Diagnosis not present

## 2015-06-12 DIAGNOSIS — E781 Pure hyperglyceridemia: Secondary | ICD-10-CM | POA: Diagnosis not present

## 2015-06-12 DIAGNOSIS — Z794 Long term (current) use of insulin: Secondary | ICD-10-CM | POA: Diagnosis not present

## 2015-06-12 DIAGNOSIS — Z79899 Other long term (current) drug therapy: Secondary | ICD-10-CM | POA: Diagnosis not present

## 2015-07-04 DIAGNOSIS — E1165 Type 2 diabetes mellitus with hyperglycemia: Secondary | ICD-10-CM | POA: Diagnosis not present

## 2015-07-04 DIAGNOSIS — E782 Mixed hyperlipidemia: Secondary | ICD-10-CM | POA: Diagnosis not present

## 2015-07-04 DIAGNOSIS — Z794 Long term (current) use of insulin: Secondary | ICD-10-CM | POA: Diagnosis not present

## 2015-07-18 ENCOUNTER — Encounter: Payer: Self-pay | Admitting: Family Medicine

## 2015-07-18 ENCOUNTER — Ambulatory Visit (INDEPENDENT_AMBULATORY_CARE_PROVIDER_SITE_OTHER): Payer: Medicare Other | Admitting: Family Medicine

## 2015-07-18 VITALS — BP 148/69 | HR 75 | Temp 97.7°F | Ht 69.7 in | Wt 312.0 lb

## 2015-07-18 DIAGNOSIS — E119 Type 2 diabetes mellitus without complications: Secondary | ICD-10-CM

## 2015-07-18 DIAGNOSIS — L304 Erythema intertrigo: Secondary | ICD-10-CM

## 2015-07-18 DIAGNOSIS — Z0000 Encounter for general adult medical examination without abnormal findings: Secondary | ICD-10-CM

## 2015-07-18 DIAGNOSIS — Z6841 Body Mass Index (BMI) 40.0 and over, adult: Secondary | ICD-10-CM

## 2015-07-18 DIAGNOSIS — E785 Hyperlipidemia, unspecified: Secondary | ICD-10-CM

## 2015-07-18 DIAGNOSIS — I10 Essential (primary) hypertension: Secondary | ICD-10-CM

## 2015-07-18 MED ORDER — CLONIDINE HCL 0.1 MG PO TABS: 0.1000 mg | ORAL_TABLET | Freq: Two times a day (BID) | ORAL | Status: DC

## 2015-07-18 MED ORDER — CARVEDILOL 25 MG PO TABS: 25.0000 mg | ORAL_TABLET | Freq: Two times a day (BID) | ORAL | Status: DC

## 2015-07-18 MED ORDER — VALACYCLOVIR HCL 500 MG PO TABS
500.0000 mg | ORAL_TABLET | Freq: Two times a day (BID) | ORAL | Status: DC
Start: 2015-07-18 — End: 2016-01-06

## 2015-07-18 MED ORDER — OLMESARTAN-AMLODIPINE-HCTZ 40-10-25 MG PO TABS: 1.0000 | ORAL_TABLET | Freq: Every day | ORAL | Status: DC

## 2015-07-18 MED ORDER — ROSUVASTATIN CALCIUM 40 MG PO TABS: 40.0000 mg | ORAL_TABLET | Freq: Every day | ORAL | Status: DC

## 2015-07-18 MED ORDER — FENOFIBRATE 145 MG PO TABS: 145.0000 mg | ORAL_TABLET | Freq: Every day | ORAL | Status: DC

## 2015-07-18 MED ORDER — CLOTRIMAZOLE-BETAMETHASONE 1-0.05 % EX LOTN: TOPICAL_LOTION | Freq: Two times a day (BID) | CUTANEOUS | Status: DC

## 2015-07-18 NOTE — Progress Notes (Signed)
BP 148/69 mmHg  Pulse 75  Temp(Src) 97.7 F (36.5 C)  Ht 5' 9.7" (1.77 m)  Wt 312 lb (141.522 kg)  BMI 45.17 kg/m2  SpO2 94%   Subjective:    Patient ID: Justin Griffith, male    DOB: [birth year], 75 y.o.   MRN: [id number]  HPI: Justin Griffith is a 75 y.o. male  Chief Complaint  Patient presents with  . Annual Exam   Patient all in all doing well working with endocrinology on getting blood sugar down In the meantime patient having problems with jock itch. And some odor. Blood pressure cholesterol all doing well no issues with medications taking medications faithfully. Relevant past medical, surgical, family and social history reviewed and updated as indicated. Interim medical history since our last visit reviewed. Allergies and medications reviewed and updated.  Review of Systems  Constitutional: Negative.   HENT: Negative.   Eyes: Negative.   Respiratory: Negative.   Cardiovascular: Negative.   Gastrointestinal: Negative.   Endocrine: Negative.   Genitourinary: Negative.   Musculoskeletal: Negative.   Skin: Negative.   Allergic/Immunologic: Negative.   Neurological: Negative.   Hematological: Negative.   Psychiatric/Behavioral: Negative.     Per HPI unless specifically indicated above     Objective:    BP 148/69 mmHg  Pulse 75  Temp(Src) 97.7 F (36.5 C)  Ht 5' 9.7" (1.77 m)  Wt 312 lb (141.522 kg)  BMI 45.17 kg/m2  SpO2 94%  Wt Readings from Last 3 Encounters:  07/18/15 312 lb (141.522 kg)  05/01/15 312 lb (141.522 kg)  01/16/15 308 lb (139.708 kg)    Physical Exam  Constitutional: He is oriented to person, place, and time. He appears well-developed and well-nourished.  HENT:  Head: Normocephalic and atraumatic.  Right Ear: External ear normal.  Left Ear: External ear normal.  Eyes: Conjunctivae and EOM are normal. Pupils are equal, round, and reactive to light.  Neck: Normal range of motion. Neck supple.  Cardiovascular: Normal rate, regular  rhythm, normal heart sounds and intact distal pulses.   Pulmonary/Chest: Effort normal and breath sounds normal.  Abdominal: Soft. Bowel sounds are normal. There is no splenomegaly or hepatomegaly.  Genitourinary: Rectum normal, prostate normal and penis normal.  Musculoskeletal: Normal range of motion.  Neurological: He is alert and oriented to person, place, and time. He has normal reflexes.  Skin: No rash noted. No erythema.  Changes of intertrigo ingrowing area  Psychiatric: He has a normal mood and affect. His behavior is normal. Judgment and thought content normal.    Results for orders placed or performed in visit on 05/01/15  Bayer DCA Hb A1c Waived  Result Value Ref Range   Bayer DCA Hb A1c Waived 9.1 (H) <7.0 %  LP+ALT+AST Piccolo, Waived  Result Value Ref Range   ALT (SGPT) Piccolo, Waived 39 10 - 47 U/L   AST (SGOT) Piccolo, Waived 37 11 - 38 U/L   Cholesterol Piccolo, Waived CANCELED    HDL Chol Piccolo, Waived CANCELED    Triglycerides Piccolo,Waived CANCELED   Lipid Panel w/o Chol/HDL Ratio  Result Value Ref Range   Cholesterol, Total 208 (H) 100 - 199 mg/dL   Triglycerides 688 (HH) 0 - 149 mg/dL   HDL 27 (L) >39 mg/dL   VLDL Cholesterol Cal Comment 5 - 40 mg/dL   LDL Calculated Comment 0 - 99 mg/dL      Assessment & Plan:   Problem List Items Addressed This Visit  Cardiovascular and Mediastinum   Hypertension - Primary    The current medical regimen is effective;  continue present plan and medications.       Relevant Medications   carvedilol (COREG) 25 MG tablet   cloNIDine (CATAPRES) 0.1 MG tablet   fenofibrate (TRICOR) 145 MG tablet   Olmesartan-Amlodipine-HCTZ 40-10-25 MG TABS   rosuvastatin (CRESTOR) 40 MG tablet     Endocrine   Diabetes mellitus without complication ([place])    Followed by endocrinology gave first shot Trulicity today      Relevant Medications   Olmesartan-Amlodipine-HCTZ 40-10-25 MG TABS   rosuvastatin (CRESTOR) 40 MG  tablet     Other   Hyperlipidemia    The current medical regimen is effective;  continue present plan and medications.       Relevant Medications   carvedilol (COREG) 25 MG tablet   cloNIDine (CATAPRES) 0.1 MG tablet   fenofibrate (TRICOR) 145 MG tablet   Olmesartan-Amlodipine-HCTZ 40-10-25 MG TABS   rosuvastatin (CRESTOR) 40 MG tablet   BMI 40.0-44.9, adult (HCC)    Wt loss diet       Other Visit Diagnoses    PE (physical exam), annual        Intertrigo        Discussed care and treatment will use Lotrisone first followed by spray antifungal agents that he will use long-term and chronically twice a day.        Follow up plan: Return in about 6 months (around 01/17/2016) for bmp, lipids, alt, ast.

## 2015-07-18 NOTE — Assessment & Plan Note (Signed)
Followed by endocrinology gave first shot Trulicity today

## 2015-07-18 NOTE — Assessment & Plan Note (Signed)
Wt loss diet

## 2015-07-18 NOTE — Assessment & Plan Note (Signed)
The current medical regimen is effective;  continue present plan and medications.  

## 2015-07-19 DIAGNOSIS — E119 Type 2 diabetes mellitus without complications: Secondary | ICD-10-CM | POA: Diagnosis not present

## 2015-07-19 DIAGNOSIS — M79675 Pain in left toe(s): Secondary | ICD-10-CM | POA: Diagnosis not present

## 2015-07-19 DIAGNOSIS — B351 Tinea unguium: Secondary | ICD-10-CM | POA: Diagnosis not present

## 2015-07-19 DIAGNOSIS — M79674 Pain in right toe(s): Secondary | ICD-10-CM | POA: Diagnosis not present

## 2015-07-31 ENCOUNTER — Encounter: Payer: Medicare Other | Admitting: Family Medicine

## 2015-08-13 DIAGNOSIS — E1165 Type 2 diabetes mellitus with hyperglycemia: Secondary | ICD-10-CM | POA: Diagnosis not present

## 2015-08-13 DIAGNOSIS — Z794 Long term (current) use of insulin: Secondary | ICD-10-CM | POA: Diagnosis not present

## 2015-08-13 DIAGNOSIS — E782 Mixed hyperlipidemia: Secondary | ICD-10-CM | POA: Diagnosis not present

## 2015-08-20 DIAGNOSIS — Z79899 Other long term (current) drug therapy: Secondary | ICD-10-CM | POA: Diagnosis not present

## 2015-08-20 DIAGNOSIS — Z794 Long term (current) use of insulin: Secondary | ICD-10-CM | POA: Diagnosis not present

## 2015-08-20 DIAGNOSIS — E781 Pure hyperglyceridemia: Secondary | ICD-10-CM | POA: Diagnosis not present

## 2015-08-20 DIAGNOSIS — E1165 Type 2 diabetes mellitus with hyperglycemia: Secondary | ICD-10-CM | POA: Diagnosis not present

## 2015-08-20 DIAGNOSIS — E162 Hypoglycemia, unspecified: Secondary | ICD-10-CM | POA: Diagnosis not present

## 2015-08-26 DIAGNOSIS — M79609 Pain in unspecified limb: Secondary | ICD-10-CM | POA: Diagnosis not present

## 2015-08-26 DIAGNOSIS — E785 Hyperlipidemia, unspecified: Secondary | ICD-10-CM | POA: Diagnosis not present

## 2015-08-26 DIAGNOSIS — I70211 Atherosclerosis of native arteries of extremities with intermittent claudication, right leg: Secondary | ICD-10-CM | POA: Diagnosis not present

## 2015-08-26 DIAGNOSIS — I10 Essential (primary) hypertension: Secondary | ICD-10-CM | POA: Diagnosis not present

## 2015-08-26 DIAGNOSIS — E119 Type 2 diabetes mellitus without complications: Secondary | ICD-10-CM | POA: Diagnosis not present

## 2015-08-30 ENCOUNTER — Other Ambulatory Visit: Payer: Self-pay | Admitting: Vascular Surgery

## 2015-09-04 ENCOUNTER — Encounter: Payer: Self-pay | Admitting: Vascular Surgery

## 2015-09-04 ENCOUNTER — Encounter
Admission: RE | Disposition: A | Discharge status: Home / Self Care | Payer: Self-pay | Source: Ambulatory Visit | Attending: Vascular Surgery

## 2015-09-04 ENCOUNTER — Ambulatory Visit
Admission: RE | Admit: 2015-09-04 | Discharge: 2015-09-04 | Disposition: A | Discharge status: Home / Self Care | Payer: Medicare Other | Source: Ambulatory Visit | Attending: Vascular Surgery | Admitting: Vascular Surgery

## 2015-09-04 DIAGNOSIS — I70221 Atherosclerosis of native arteries of extremities with rest pain, right leg: Secondary | ICD-10-CM | POA: Insufficient documentation

## 2015-09-04 DIAGNOSIS — Z794 Long term (current) use of insulin: Secondary | ICD-10-CM | POA: Insufficient documentation

## 2015-09-04 DIAGNOSIS — I8310 Varicose veins of unspecified lower extremity with inflammation: Secondary | ICD-10-CM | POA: Insufficient documentation

## 2015-09-04 DIAGNOSIS — M1990 Unspecified osteoarthritis, unspecified site: Secondary | ICD-10-CM | POA: Diagnosis not present

## 2015-09-04 DIAGNOSIS — M79609 Pain in unspecified limb: Secondary | ICD-10-CM | POA: Diagnosis not present

## 2015-09-04 DIAGNOSIS — Z9049 Acquired absence of other specified parts of digestive tract: Secondary | ICD-10-CM | POA: Insufficient documentation

## 2015-09-04 DIAGNOSIS — I70211 Atherosclerosis of native arteries of extremities with intermittent claudication, right leg: Secondary | ICD-10-CM | POA: Diagnosis not present

## 2015-09-04 DIAGNOSIS — I10 Essential (primary) hypertension: Secondary | ICD-10-CM | POA: Insufficient documentation

## 2015-09-04 DIAGNOSIS — Z87891 Personal history of nicotine dependence: Secondary | ICD-10-CM | POA: Insufficient documentation

## 2015-09-04 DIAGNOSIS — Z96653 Presence of artificial knee joint, bilateral: Secondary | ICD-10-CM | POA: Diagnosis not present

## 2015-09-04 DIAGNOSIS — F1021 Alcohol dependence, in remission: Secondary | ICD-10-CM | POA: Insufficient documentation

## 2015-09-04 DIAGNOSIS — M7989 Other specified soft tissue disorders: Secondary | ICD-10-CM | POA: Insufficient documentation

## 2015-09-04 DIAGNOSIS — E119 Type 2 diabetes mellitus without complications: Secondary | ICD-10-CM | POA: Diagnosis not present

## 2015-09-04 DIAGNOSIS — Z833 Family history of diabetes mellitus: Secondary | ICD-10-CM | POA: Diagnosis not present

## 2015-09-04 DIAGNOSIS — I872 Venous insufficiency (chronic) (peripheral): Secondary | ICD-10-CM | POA: Insufficient documentation

## 2015-09-04 DIAGNOSIS — E785 Hyperlipidemia, unspecified: Secondary | ICD-10-CM | POA: Diagnosis not present

## 2015-09-04 DIAGNOSIS — M4806 Spinal stenosis, lumbar region: Secondary | ICD-10-CM | POA: Insufficient documentation

## 2015-09-04 HISTORY — PX: PERIPHERAL VASCULAR CATHETERIZATION: SHX172C

## 2015-09-04 LAB — BUN: BUN: 24 mg/dL — ABNORMAL HIGH (ref 6–20)

## 2015-09-04 LAB — CREATININE, SERUM
Creatinine, Ser: 1.2 mg/dL (ref 0.61–1.24)
GFR calc Af Amer: >60 mL/min (ref >60)
GFR calc non Af Amer: 57 mL/min — ABNORMAL LOW (ref >60)

## 2015-09-04 SURGERY — LOWER EXTREMITY ANGIOGRAPHY
Anesthesia: Moderate Sedation | Site: Leg Lower | Laterality: Right

## 2015-09-04 MED ORDER — FENTANYL CITRATE (PF) 100 MCG/2ML IJ SOLN
INTRAMUSCULAR | Status: AC
  Filled 2015-09-04: qty 2

## 2015-09-04 MED ORDER — FENTANYL CITRATE (PF) 100 MCG/2ML IJ SOLN
INTRAMUSCULAR | Status: DC | PRN
  Administered 2015-09-04 (×3): 50 ug via INTRAVENOUS

## 2015-09-04 MED ORDER — SODIUM CHLORIDE 0.9 % IV SOLN: INTRAVENOUS | Status: DC

## 2015-09-04 MED ORDER — HEPARIN SODIUM (PORCINE) 1000 UNIT/ML IJ SOLN
INTRAMUSCULAR | Status: DC | PRN
  Administered 2015-09-04: 4000 [IU] via INTRAVENOUS

## 2015-09-04 MED ORDER — FAMOTIDINE 20 MG PO TABS: 40.0000 mg | ORAL_TABLET | ORAL | Status: DC | PRN

## 2015-09-04 MED ORDER — ONDANSETRON HCL 4 MG/2ML IJ SOLN: 4.0000 mg | Freq: Four times a day (QID) | INTRAMUSCULAR | Status: DC | PRN

## 2015-09-04 MED ORDER — HEPARIN SODIUM (PORCINE) 1000 UNIT/ML IJ SOLN
INTRAMUSCULAR | Status: AC
  Filled 2015-09-04: qty 1

## 2015-09-04 MED ORDER — ALUM & MAG HYDROXIDE-SIMETH 200-200-20 MG/5ML PO SUSP: 15.0000 mL | ORAL | Status: DC | PRN

## 2015-09-04 MED ORDER — IOPAMIDOL (ISOVUE-300) INJECTION 61%
INTRAVENOUS | Status: DC | PRN
  Administered 2015-09-04: 90 mL via INTRA_ARTERIAL

## 2015-09-04 MED ORDER — MIDAZOLAM HCL 5 MG/5ML IJ SOLN
INTRAMUSCULAR | Status: AC
  Filled 2015-09-04: qty 5

## 2015-09-04 MED ORDER — DEXTROSE 5 % IV SOLN
INTRAVENOUS | Status: AC
  Filled 2015-09-04 (×35): qty 1.5

## 2015-09-04 MED ORDER — CLOPIDOGREL BISULFATE 75 MG PO TABS: 75.0000 mg | ORAL_TABLET | Freq: Every day | ORAL | Status: DC

## 2015-09-04 MED ORDER — HYDRALAZINE HCL 20 MG/ML IJ SOLN: 5.0000 mg | INTRAMUSCULAR | Status: DC | PRN

## 2015-09-04 MED ORDER — MORPHINE SULFATE (PF) 4 MG/ML IV SOLN: 2.0000 mg | INTRAVENOUS | Status: DC | PRN

## 2015-09-04 MED ORDER — METOPROLOL TARTRATE 5 MG/5ML IV SOLN: 5.0000 mg | Freq: Four times a day (QID) | INTRAVENOUS | Status: DC

## 2015-09-04 MED ORDER — METHYLPREDNISOLONE SODIUM SUCC 125 MG IJ SOLR: 125.0000 mg | INTRAMUSCULAR | Status: DC | PRN

## 2015-09-04 MED ORDER — DEXTROSE 5 % IV SOLN
1.5000 g | INTRAVENOUS | Status: AC
  Administered 2015-09-04: 1.5 g via INTRAVENOUS

## 2015-09-04 MED ORDER — ACETAMINOPHEN 325 MG RE SUPP: 325.0000 mg | RECTAL | Status: DC | PRN

## 2015-09-04 MED ORDER — HEPARIN (PORCINE) IN NACL 2-0.9 UNIT/ML-% IJ SOLN
INTRAMUSCULAR | Status: AC
  Filled 2015-09-04: qty 1000

## 2015-09-04 MED ORDER — LABETALOL HCL 5 MG/ML IV SOLN: 10.0000 mg | INTRAVENOUS | Status: DC | PRN

## 2015-09-04 MED ORDER — MIDAZOLAM HCL 2 MG/2ML IJ SOLN
INTRAMUSCULAR | Status: DC | PRN
  Administered 2015-09-04 (×2): 1 mg via INTRAVENOUS
  Administered 2015-09-04: 2 mg via INTRAVENOUS

## 2015-09-04 MED ORDER — LIDOCAINE HCL (PF) 1 % IJ SOLN
INTRAMUSCULAR | Status: AC
  Filled 2015-09-04: qty 30

## 2015-09-04 MED ORDER — CLOPIDOGREL BISULFATE 75 MG PO TABS
ORAL_TABLET | ORAL | Status: AC
  Filled 2015-09-04: qty 4

## 2015-09-04 MED ORDER — CLOPIDOGREL BISULFATE 75 MG PO TABS
300.0000 mg | ORAL_TABLET | ORAL | Status: AC
  Administered 2015-09-04: 300 mg via ORAL

## 2015-09-04 MED ORDER — LIDOCAINE HCL (PF) 1 % IJ SOLN
INTRAMUSCULAR | Status: DC | PRN
  Administered 2015-09-04: 5 mL via INTRADERMAL

## 2015-09-04 MED ORDER — HYDROMORPHONE HCL 1 MG/ML IJ SOLN: 1.0000 mg | Freq: Once | INTRAMUSCULAR | Status: DC

## 2015-09-04 MED ORDER — OXYCODONE HCL 5 MG PO TABS: 5.0000 mg | ORAL_TABLET | ORAL | Status: DC | PRN

## 2015-09-04 MED ORDER — ACETAMINOPHEN 325 MG PO TABS: 325.0000 mg | ORAL_TABLET | ORAL | Status: DC | PRN

## 2015-09-04 SURGICAL SUPPLY — 24 items
BALLN LUTONIX DCB 6X100X130 (BALLOONS) ×4
BALLN ULTRVRSE 3X150X130 (BALLOONS) ×4
BALLN ULTRVRSE 3X40X130C (BALLOONS) ×4
BALLOON LUTONIX DCB 6X100X130 (BALLOONS) ×2 IMPLANT
BALLOON ULTRVRSE 3X150X130 (BALLOONS) ×2 IMPLANT
BALLOON ULTRVRSE 3X40X130C (BALLOONS) ×2 IMPLANT
CANNULA 5F STIFF (CANNULA) ×4 IMPLANT
CATH PIG 70CM (CATHETERS) ×4 IMPLANT
CATH STS 5FR 125CM (CATHETERS) ×4 IMPLANT
DEVICE PRESTO INFLATION (MISCELLANEOUS) ×4 IMPLANT
DEVICE STARCLOSE SE CLOSURE (Vascular Products) ×4 IMPLANT
DEVICE TORQUE (MISCELLANEOUS) ×4 IMPLANT
GLIDEWIRE ANGLED SS 035X260CM (WIRE) ×4 IMPLANT
GUIDEWIRE SUPER STIFF .035X180 (WIRE) ×4 IMPLANT
PACK ANGIOGRAPHY (CUSTOM PROCEDURE TRAY) ×4 IMPLANT
SET INTRO CAPELLA COAXIAL (SET/KITS/TRAYS/PACK) ×4 IMPLANT
SHEATH BRITE TIP 5FRX11 (SHEATH) ×4 IMPLANT
SHEATH RAABE 6FR (SHEATH) ×4 IMPLANT
SHIELD RADPAD SCOOP 12X17 (MISCELLANEOUS) ×4 IMPLANT
SUPPORT SCROTAL XLRG STRP (MISCELLANEOUS) ×3 IMPLANT
SUSPENSORY XL W/STRAP (MISCELLANEOUS) ×1
SYR MEDRAD MARK V 150ML (SYRINGE) ×4 IMPLANT
TUBING CONTRAST HIGH PRESS 72 (TUBING) ×4 IMPLANT
WIRE J 3MM .035X145CM (WIRE) ×4 IMPLANT

## 2015-09-04 NOTE — Op Note (Signed)
[name] Percutaneous Study/Intervention Procedural Note   Date of Surgery: 09/04/2015  Surgeon: Katha Cabal  Pre-operative Diagnosis: Atherosclerotic occlusive disease bilateral lower extremities with rest pain of the right lower extremity  Post-operative diagnosis: Same  Procedure(s) Performed: 1. Introduction catheter into right lower extremity 3rd order catheter placement with additional third order catheter placement  2. Contrast injection right lower extremity for distal runoff  3. Percutaneous transluminal angioplasty right superficial femoral artery to 6 mm using a Lutonix balloon 4. Percutaneous transluminal angioplasty right anterior tibial artery to 3 mm             5.  Percutaneous transluminal angioplasty of the right peroneal artery to 3 mm  6. Star close closure left common femoral arteriotomy  Anesthesia: Conscious sedation was administered under my direct supervision. IV Versed plus fentanyl were utilized. Continuous ECG, pulse oximetry and blood pressure was monitored throughout the entire procedure. A total of 4 milligrams of Versed and 150 micrograms of fentanyl were utilized. Conscious sedation was for a total of 1 hour 30 minutes.   Sheath: 6 French rabies sheath left common femoral  Contrast: 90 cc  Fluoroscopy Time: 9.6 minutes  Indications: [name] presents with increasing pain of the right lower extremity. Physical examination as well as noninvasive studies show a deterioration over previous exams. This suggests the patient is having worsening limb ischemia. The risks and benefits are reviewed all questions answered patient agrees to proceed.  Procedure:Justin Griffith is a 75 y.o. y.o. male who was identified and appropriate procedural time out was performed. The patient was then placed supine on the table and prepped and draped in the usual  sterile fashion.   Ultrasound was placed in the sterile sleeve and the left groin was evaluated the left common femoral artery was echolucent and pulsatile indicating patency. Image was recorded for the permanent record and under real-time visualization a microneedle was inserted into the common femoral artery microwire followed by a micro-sheath. A J-wire was then advanced through the micro-sheath and a 5 [place] sheath was then inserted over a J-wire. J-wire was then advanced and a 5 French pigtail catheter was positioned at the level of T12.  AP projection of the aorta was then obtained. Pigtail catheter was repositioned to above the bifurcation and a LAO view of the pelvis was obtained. Subsequently a pigtail catheter with the stiff angle Glidewire was used to cross the aortic bifurcation the catheter wire were advanced down into the right distal external iliac artery. Oblique view of the femoral bifurcation was then obtained and subsequently the wire was reintroduced and the pigtail catheter negotiated into the SFA representing third order catheter placement. Distal runoff was then performed.  5000 units of heparin was then given and allowed to circulate and a 6 [place] Rabi sheath was advanced up and over the bifurcation and positioned in the femoral artery  Straight catheter and stiff angle Glidewire were then negotiated down into the distal popliteal. Catheter was then advanced. Hand injection contrast demonstrated the tibial anatomy in detail. Short segment occlusion is noted in the proximal anterior tibial over the first 6-8 cm. A focal string sign is noted in the proximal peroneal as well. Posterior tibial fills but is somewhat small.  The wires then negotiated into the anterior tibial and down to approximately the level of the ankle.   The detector was then repositioned and the SFA and proximal popliteal was reimaged greater than 90% stenosis is noted  in the area of Hunter's canal area.   Appropriate measurements are made a 6 x 10 Lutonix balloon was used to angioplasty the superficial femoral and popliteal arteries. Inflations were to 12 atmospheres for 2 minutes. Follow-up imaging demonstrated patency with excellent result.    The tibials were then again imaged and a 3 x 12 balloon was used to angioplasty the anterior tibial. The inflation was to 12 atm for 1 minute. Follow-up imaging demonstrated excellent patency with preservation of the distal runoff. The straight catheter was then reintroduced the versa core wire exchanged for the angle glide and the catheter and Glidewire were negotiated into the peroneal. This represents the additional third order catheter placement. Hand injection contrast demonstrated patency distally. First core wire was reintroduced and a 3 x 4 balloon was advanced across the lesion inflated to 12 atm for 1 minute. Distal runoff was again assessed and was excellent.  After review of these images the sheath is pulled into the left external iliac oblique of the common femoral is obtained and a Star close device deployed. There no immediate Complications.  Findings: The abdominal aorta is opacified with a bolus injection contrast. Renal arteries are widely patent. The aorta itself has diffuse disease but no hemodynamically significant lesions. The common and external iliac arteries are widely patent bilaterally.  The right common femoral is widely patent as is the profunda femoris.  The SFA does indeed have a significant stenosis of 90% at Hunter's canal with diffuse disease above and below this level extending over a length of approximately 80-90 mm.  The distal popliteal demonstrates increasing disease and the trifurcation is heavily diseased with occlusion of the anterior tibial and a string sign within the proximal peroneal.  Following angioplasty anterior tibial and the peroneal are widely patent and now there is in-line flow and looks quite nice.  Angioplasty of the SFA at Hunter's canal yields an excellent result with less than 10% residual stenosis.  Summary: Successful recanalization right lower cavity for limb salvage   Disposition: Patient was taken to the recovery room in stable condition having tolerated the procedure well.  Schnier, Dolores Lory 09/04/2015,9:41 AM

## 2015-09-04 NOTE — H&P (Signed)
[facility] History & Physical Update  The patient was interviewed and re-examined.  The patient's previous History and Physical has been reviewed and is unchanged.  There is no change in the plan of care. We plan to proceed with the scheduled procedure.  Leinani Lisbon, Dolores Lory, MD  09/04/2015, 8:19 AM

## 2015-09-16 DIAGNOSIS — E119 Type 2 diabetes mellitus without complications: Secondary | ICD-10-CM | POA: Diagnosis not present

## 2015-09-16 DIAGNOSIS — I739 Peripheral vascular disease, unspecified: Secondary | ICD-10-CM | POA: Diagnosis not present

## 2015-09-16 DIAGNOSIS — I10 Essential (primary) hypertension: Secondary | ICD-10-CM | POA: Diagnosis not present

## 2015-09-16 DIAGNOSIS — E669 Obesity, unspecified: Secondary | ICD-10-CM | POA: Diagnosis not present

## 2015-09-16 DIAGNOSIS — Z87898 Personal history of other specified conditions: Secondary | ICD-10-CM | POA: Diagnosis not present

## 2015-09-16 DIAGNOSIS — I209 Angina pectoris, unspecified: Secondary | ICD-10-CM | POA: Diagnosis not present

## 2015-09-16 DIAGNOSIS — G4733 Obstructive sleep apnea (adult) (pediatric): Secondary | ICD-10-CM | POA: Diagnosis not present

## 2015-09-16 DIAGNOSIS — R0602 Shortness of breath: Secondary | ICD-10-CM | POA: Diagnosis not present

## 2015-09-25 ENCOUNTER — Ambulatory Visit
Admission: RE | Admit: 2015-09-25 | Discharge: 2015-09-25 | Disposition: A | Discharge status: Home / Self Care | Payer: Medicare Other | Source: Ambulatory Visit | Attending: Internal Medicine | Admitting: Internal Medicine

## 2015-09-25 ENCOUNTER — Ambulatory Visit: Admit: 2015-09-25 | Payer: Self-pay | Admitting: Internal Medicine

## 2015-09-25 ENCOUNTER — Encounter
Admission: RE | Disposition: A | Discharge status: Home / Self Care | Payer: Self-pay | Source: Ambulatory Visit | Attending: Internal Medicine

## 2015-09-25 DIAGNOSIS — I739 Peripheral vascular disease, unspecified: Secondary | ICD-10-CM | POA: Insufficient documentation

## 2015-09-25 DIAGNOSIS — Z833 Family history of diabetes mellitus: Secondary | ICD-10-CM | POA: Diagnosis not present

## 2015-09-25 DIAGNOSIS — Z888 Allergy status to other drugs, medicaments and biological substances status: Secondary | ICD-10-CM | POA: Insufficient documentation

## 2015-09-25 DIAGNOSIS — G4733 Obstructive sleep apnea (adult) (pediatric): Secondary | ICD-10-CM | POA: Insufficient documentation

## 2015-09-25 DIAGNOSIS — Z87891 Personal history of nicotine dependence: Secondary | ICD-10-CM | POA: Diagnosis not present

## 2015-09-25 DIAGNOSIS — R0602 Shortness of breath: Secondary | ICD-10-CM | POA: Insufficient documentation

## 2015-09-25 DIAGNOSIS — Z9049 Acquired absence of other specified parts of digestive tract: Secondary | ICD-10-CM | POA: Diagnosis not present

## 2015-09-25 DIAGNOSIS — M1990 Unspecified osteoarthritis, unspecified site: Secondary | ICD-10-CM | POA: Insufficient documentation

## 2015-09-25 DIAGNOSIS — J449 Chronic obstructive pulmonary disease, unspecified: Secondary | ICD-10-CM | POA: Diagnosis not present

## 2015-09-25 DIAGNOSIS — I272 Other secondary pulmonary hypertension: Secondary | ICD-10-CM | POA: Insufficient documentation

## 2015-09-25 DIAGNOSIS — Z9109 Other allergy status, other than to drugs and biological substances: Secondary | ICD-10-CM | POA: Insufficient documentation

## 2015-09-25 DIAGNOSIS — Z79899 Other long term (current) drug therapy: Secondary | ICD-10-CM | POA: Diagnosis not present

## 2015-09-25 DIAGNOSIS — I10 Essential (primary) hypertension: Secondary | ICD-10-CM | POA: Insufficient documentation

## 2015-09-25 DIAGNOSIS — Z7982 Long term (current) use of aspirin: Secondary | ICD-10-CM | POA: Insufficient documentation

## 2015-09-25 DIAGNOSIS — E785 Hyperlipidemia, unspecified: Secondary | ICD-10-CM | POA: Diagnosis not present

## 2015-09-25 DIAGNOSIS — I2510 Atherosclerotic heart disease of native coronary artery without angina pectoris: Secondary | ICD-10-CM | POA: Diagnosis not present

## 2015-09-25 DIAGNOSIS — Z794 Long term (current) use of insulin: Secondary | ICD-10-CM | POA: Insufficient documentation

## 2015-09-25 DIAGNOSIS — E669 Obesity, unspecified: Secondary | ICD-10-CM | POA: Insufficient documentation

## 2015-09-25 DIAGNOSIS — E119 Type 2 diabetes mellitus without complications: Secondary | ICD-10-CM | POA: Diagnosis not present

## 2015-09-25 DIAGNOSIS — Z6841 Body Mass Index (BMI) 40.0 and over, adult: Secondary | ICD-10-CM | POA: Diagnosis not present

## 2015-09-25 DIAGNOSIS — I209 Angina pectoris, unspecified: Secondary | ICD-10-CM | POA: Diagnosis not present

## 2015-09-25 HISTORY — PX: CARDIAC CATHETERIZATION: SHX172

## 2015-09-25 LAB — GLUCOSE, CAPILLARY: Glucose-Capillary: 149 mg/dL — ABNORMAL HIGH (ref 65–99)

## 2015-09-25 SURGERY — LEFT HEART CATH AND CORONARY ANGIOGRAPHY
Anesthesia: Moderate Sedation

## 2015-09-25 SURGERY — LEFT HEART CATH AND CORONARY ANGIOGRAPHY
Anesthesia: Moderate Sedation | Laterality: Left

## 2015-09-25 MED ORDER — MIDAZOLAM HCL 2 MG/2ML IJ SOLN
INTRAMUSCULAR | Status: AC
  Filled 2015-09-25: qty 2

## 2015-09-25 MED ORDER — FENTANYL CITRATE (PF) 100 MCG/2ML IJ SOLN
INTRAMUSCULAR | Status: AC
  Filled 2015-09-25: qty 2

## 2015-09-25 MED ORDER — FENTANYL CITRATE (PF) 100 MCG/2ML IJ SOLN
INTRAMUSCULAR | Status: DC | PRN
  Administered 2015-09-25: 25 ug via INTRAVENOUS

## 2015-09-25 MED ORDER — SODIUM CHLORIDE 0.9% FLUSH: 3.0000 mL | INTRAVENOUS | Status: DC | PRN

## 2015-09-25 MED ORDER — SODIUM CHLORIDE 0.9 % IV SOLN: 250.0000 mL | INTRAVENOUS | Status: DC | PRN

## 2015-09-25 MED ORDER — ASPIRIN 81 MG PO CHEW: 81.0000 mg | CHEWABLE_TABLET | ORAL | Status: DC

## 2015-09-25 MED ORDER — IOPAMIDOL (ISOVUE-300) INJECTION 61%
INTRAVENOUS | Status: DC | PRN
  Administered 2015-09-25: 140 mL via INTRA_ARTERIAL

## 2015-09-25 MED ORDER — ACETAMINOPHEN 325 MG PO TABS
ORAL_TABLET | ORAL | Status: AC
  Filled 2015-09-25: qty 2

## 2015-09-25 MED ORDER — SODIUM CHLORIDE 0.9 % WEIGHT BASED INFUSION: 1.0000 mL/kg/h | INTRAVENOUS | Status: DC

## 2015-09-25 MED ORDER — SODIUM CHLORIDE 0.9% FLUSH: 3.0000 mL | Freq: Two times a day (BID) | INTRAVENOUS | Status: DC

## 2015-09-25 MED ORDER — SODIUM CHLORIDE 0.9 % WEIGHT BASED INFUSION
3.0000 mL/kg/h | INTRAVENOUS | Status: DC
Start: 2015-09-26 — End: 2015-09-25

## 2015-09-25 MED ORDER — ACETAMINOPHEN 325 MG PO TABS
650.0000 mg | ORAL_TABLET | ORAL | Status: DC | PRN
  Administered 2015-09-25: 650 mg via ORAL

## 2015-09-25 MED ORDER — LABETALOL HCL 5 MG/ML IV SOLN
INTRAVENOUS | Status: DC | PRN
Start: 2015-09-25 — End: 2015-09-25
  Administered 2015-09-25 (×2): 10 mg via INTRAVENOUS

## 2015-09-25 MED ORDER — MIDAZOLAM HCL 2 MG/2ML IJ SOLN
INTRAMUSCULAR | Status: DC | PRN
  Administered 2015-09-25: 1 mg via INTRAVENOUS

## 2015-09-25 MED ORDER — HEPARIN (PORCINE) IN NACL 2-0.9 UNIT/ML-% IJ SOLN
INTRAMUSCULAR | Status: AC
  Filled 2015-09-25: qty 500

## 2015-09-25 MED ORDER — SODIUM CHLORIDE 0.9 % IV SOLN
INTRAVENOUS | Status: DC
  Administered 2015-09-25 (×2): via INTRAVENOUS

## 2015-09-25 MED ORDER — ONDANSETRON HCL 4 MG/2ML IJ SOLN: 4.0000 mg | Freq: Four times a day (QID) | INTRAMUSCULAR | Status: DC | PRN

## 2015-09-25 SURGICAL SUPPLY — 9 items
CATH INFINITI 5FR ANG PIGTAIL (CATHETERS) ×3 IMPLANT
CATH INFINITI 5FR JL4 (CATHETERS) ×3 IMPLANT
CATH INFINITI 5FR JL5 (CATHETERS) ×3 IMPLANT
CATH INFINITI JR4 5F (CATHETERS) ×3 IMPLANT
KIT MANI 3VAL PERCEP (MISCELLANEOUS) ×3 IMPLANT
NEEDLE PERC 18GX7CM (NEEDLE) ×3 IMPLANT
PACK CARDIAC CATH (CUSTOM PROCEDURE TRAY) ×3 IMPLANT
SHEATH AVANTI 5FR X 11CM (SHEATH) ×3 IMPLANT
WIRE EMERALD 3MM-J .035X150CM (WIRE) ×3 IMPLANT

## 2015-09-25 NOTE — Discharge Instructions (Signed)

## 2015-09-25 NOTE — Progress Notes (Signed)
Pt clinically stable post heart cath, right groin manual hold, no bleeding nor hematoma, wife/daughter present, Dr Clayborn Bigness out to speak with patient and family with questions answered, taking po's without difficulty

## 2015-09-26 ENCOUNTER — Encounter: Payer: Self-pay | Admitting: Internal Medicine

## 2015-10-03 DIAGNOSIS — E785 Hyperlipidemia, unspecified: Secondary | ICD-10-CM | POA: Diagnosis not present

## 2015-10-03 DIAGNOSIS — I70221 Atherosclerosis of native arteries of extremities with rest pain, right leg: Secondary | ICD-10-CM | POA: Diagnosis not present

## 2015-10-03 DIAGNOSIS — E669 Obesity, unspecified: Secondary | ICD-10-CM | POA: Diagnosis not present

## 2015-10-03 DIAGNOSIS — M79609 Pain in unspecified limb: Secondary | ICD-10-CM | POA: Diagnosis not present

## 2015-10-03 DIAGNOSIS — I70211 Atherosclerosis of native arteries of extremities with intermittent claudication, right leg: Secondary | ICD-10-CM | POA: Diagnosis not present

## 2015-10-03 DIAGNOSIS — E119 Type 2 diabetes mellitus without complications: Secondary | ICD-10-CM | POA: Diagnosis not present

## 2015-10-03 DIAGNOSIS — I739 Peripheral vascular disease, unspecified: Secondary | ICD-10-CM | POA: Diagnosis not present

## 2015-10-03 DIAGNOSIS — I10 Essential (primary) hypertension: Secondary | ICD-10-CM | POA: Diagnosis not present

## 2015-10-03 DIAGNOSIS — I2510 Atherosclerotic heart disease of native coronary artery without angina pectoris: Secondary | ICD-10-CM | POA: Diagnosis not present

## 2015-10-03 DIAGNOSIS — I70213 Atherosclerosis of native arteries of extremities with intermittent claudication, bilateral legs: Secondary | ICD-10-CM | POA: Diagnosis not present

## 2015-10-17 DIAGNOSIS — E119 Type 2 diabetes mellitus without complications: Secondary | ICD-10-CM | POA: Diagnosis not present

## 2015-10-17 DIAGNOSIS — I10 Essential (primary) hypertension: Secondary | ICD-10-CM | POA: Diagnosis not present

## 2015-10-17 DIAGNOSIS — Z87891 Personal history of nicotine dependence: Secondary | ICD-10-CM | POA: Diagnosis not present

## 2015-10-17 DIAGNOSIS — I209 Angina pectoris, unspecified: Secondary | ICD-10-CM | POA: Diagnosis not present

## 2015-10-17 DIAGNOSIS — G4733 Obstructive sleep apnea (adult) (pediatric): Secondary | ICD-10-CM | POA: Diagnosis not present

## 2015-10-17 DIAGNOSIS — E669 Obesity, unspecified: Secondary | ICD-10-CM | POA: Diagnosis not present

## 2015-10-17 DIAGNOSIS — I739 Peripheral vascular disease, unspecified: Secondary | ICD-10-CM | POA: Diagnosis not present

## 2015-10-17 DIAGNOSIS — R0602 Shortness of breath: Secondary | ICD-10-CM | POA: Diagnosis not present

## 2015-10-22 DIAGNOSIS — Z794 Long term (current) use of insulin: Secondary | ICD-10-CM | POA: Diagnosis not present

## 2015-10-22 DIAGNOSIS — E1165 Type 2 diabetes mellitus with hyperglycemia: Secondary | ICD-10-CM | POA: Diagnosis not present

## 2015-10-22 DIAGNOSIS — E781 Pure hyperglyceridemia: Secondary | ICD-10-CM | POA: Diagnosis not present

## 2015-11-05 ENCOUNTER — Encounter: Payer: Self-pay | Admitting: *Deleted

## 2015-11-13 ENCOUNTER — Other Ambulatory Visit: Payer: Self-pay | Admitting: Family Medicine

## 2015-11-13 ENCOUNTER — Telehealth: Payer: Self-pay | Admitting: Family Medicine

## 2015-11-13 NOTE — Telephone Encounter (Signed)
Pt will not get his Humalog until end of next week.  We have a sample but pt will need more.  Can we give another sample of something?  If not can we send a prescription to [facility]?  Please call pt to advise.

## 2015-11-13 NOTE — Telephone Encounter (Signed)
samples

## 2015-12-26 ENCOUNTER — Telehealth: Payer: Self-pay | Admitting: Family Medicine

## 2015-12-26 NOTE — Telephone Encounter (Signed)
Patient states he is running out because he has had to increase dosing.  Samples given  Toujeo and Humalog vials (no kwikpens)  Instructed to move regular appointment up if having to increase consistantly

## 2015-12-30 ENCOUNTER — Encounter: Payer: Self-pay | Admitting: *Deleted

## 2016-01-06 ENCOUNTER — Ambulatory Visit (INDEPENDENT_AMBULATORY_CARE_PROVIDER_SITE_OTHER): Payer: Medicare Other | Admitting: General Surgery

## 2016-01-06 ENCOUNTER — Encounter: Payer: Self-pay | Admitting: General Surgery

## 2016-01-06 VITALS — BP 142/82 | HR 78 | Resp 16 | Ht 72.0 in | Wt 317.0 lb

## 2016-01-06 DIAGNOSIS — Z85038 Personal history of other malignant neoplasm of large intestine: Secondary | ICD-10-CM | POA: Diagnosis not present

## 2016-01-06 DIAGNOSIS — K219 Gastro-esophageal reflux disease without esophagitis: Secondary | ICD-10-CM | POA: Diagnosis not present

## 2016-01-06 MED ORDER — POLYETHYLENE GLYCOL 3350 17 GM/SCOOP PO POWD: 1.0000 | Freq: Once | ORAL | 0 refills | Status: AC

## 2016-01-06 MED ORDER — POLYETHYLENE GLYCOL 3350 17 GM/SCOOP PO POWD
1.0000 | Freq: Once | ORAL | 0 refills | Status: DC
Start: 2016-01-06 — End: 2016-01-06

## 2016-01-06 NOTE — Assessment & Plan Note (Signed)
Carcinoma sigmoid colon with 4 nodes positive. Status post adjuvant chemotherapy in 2001 with sigmoid resection.

## 2016-01-06 NOTE — Patient Instructions (Addendum)
  Colonoscopy A colonoscopy is an exam to look at the entire large intestine (colon). This exam can help find problems such as tumors, polyps, inflammation, and areas of bleeding. The exam takes about 1 hour.  LET YOUR HEALTH CARE PROVIDER KNOW ABOUT:   Any allergies you have.  All medicines you are taking, including vitamins, herbs, eye drops, creams, and over-the-counter medicines.  Previous problems you or members of your family have had with the use of anesthetics.  Any blood disorders you have.  Previous surgeries you have had.  Medical conditions you have. RISKS AND COMPLICATIONS  Generally, this is a safe procedure. However, as with any procedure, complications can occur. Possible complications include:  Bleeding.  Tearing or rupture of the colon wall.  Reaction to medicines given during the exam.  Infection (rare). BEFORE THE PROCEDURE   Ask your health care provider about changing or stopping your regular medicines.  You may be prescribed an oral bowel prep. This involves drinking a large amount of medicated liquid, starting the day before your procedure. The liquid will cause you to have multiple loose stools until your stool is almost clear or light green. This cleans out your colon in preparation for the procedure.  Do not eat or drink anything else once you have started the bowel prep, unless your health care provider tells you it is safe to do so.  Arrange for someone to drive you home after the procedure. PROCEDURE   You will be given medicine to help you relax (sedative).  You will lie on your side with your knees bent.  A long, flexible tube with a light and camera on the end (colonoscope) will be inserted through the rectum and into the colon. The camera sends video back to a computer screen as it moves through the colon. The colonoscope also releases carbon dioxide gas to inflate the colon. This helps your health care provider see the area better.  During  the exam, your health care provider may take a small tissue sample (biopsy) to be examined under a microscope if any abnormalities are found.  The exam is finished when the entire colon has been viewed. AFTER THE PROCEDURE   Do not drive for 24 hours after the exam.  You may have a small amount of blood in your stool.  You may pass moderate amounts of gas and have mild abdominal cramping or bloating. This is caused by the gas used to inflate your colon during the exam.  Ask when your test results will be ready and how you will get your results. Make sure you get your test results.   This information is not intended to replace advice given to you by your health care provider. Make sure you discuss any questions you have with your health care provider.   Document Released: 03/06/2000 Document Revised: 12/28/2012 Document Reviewed: 11/14/2012 Elsevier Interactive Patient Education 2016 Elsevier Inc.  

## 2016-01-06 NOTE — Progress Notes (Signed)
Patient ID: Justin Griffith, male   DOB: 07-04-1940, 75 y.o.   MRN: [id number]  Chief Complaint  Patient presents with  . Colonoscopy    HPI Justin Griffith is a 75 y.o. male here today for a 5 year follow up for a colonoscopy. His last one was 2012. He does have constipation been going on for 2 months, he takes fiber supplements to help. He states he has been some acid reflux, he has not taken any medications for this.  His wife Justin Griffith is present for exam. HPI  Past Medical History:  Diagnosis Date  . Arthritis   . Asthma 2000  . Back pain   . Colon polyp 2012  . Diabetes mellitus without complication ([place])   . Hernia 2000  . Hyperlipidemia   . Hypertension   . Personal history of malignant neoplasm of large intestine   . Personal history of tobacco use, presenting hazards to health   . Sleep apnea   . Special screening for malignant neoplasms, colon     Past Surgical History:  Procedure Laterality Date  . CARDIAC CATHETERIZATION Left 09/25/2015   Procedure: Left Heart Cath and Coronary Angiography;  Surgeon: Yolonda Kida, MD;  Location: [place] [facility];  Service: Cardiovascular;  Laterality: Left;  . CHOLECYSTECTOMY  1970  . COLON SURGERY  07-16-99   sigmoid colon resection with primary anastomosis, chemotherapy for metastatic disease  . COLONOSCOPY  2001, 2012   Dr Bary Castilla, tubular adenoma of the cecum and ascending colon in 2012.  Marland Kitchen HERNIA REPAIR Right    right inguinal hernia repair  . JOINT REPLACEMENT Bilateral    knee replacement  . KNEE SURGERY Bilateral 2010  . MYRINGOTOMY WITH TUBE PLACEMENT Bilateral 08/16/2014   Procedure: MYRINGOTOMY WITH TUBE PLACEMENT;  Surgeon: Carloyn Manner, MD;  Location: [facility];  Service: ENT;  Laterality: Bilateral;  . PERIPHERAL VASCULAR CATHETERIZATION Right 09/04/2015   Procedure: Lower Extremity Angiography;  Surgeon: Katha Cabal, MD;  Location: [place] [facility];  Service: Cardiovascular;  Laterality: Right;   . PERIPHERAL VASCULAR CATHETERIZATION  09/04/2015   Procedure: Lower Extremity Intervention;  Surgeon: Katha Cabal, MD;  Location: [place] [facility];  Service: Cardiovascular;;  . TONSILLECTOMY    . TYMPANOSTOMY TUBE PLACEMENT      Family History  Problem Relation Age of Onset  . Diabetes Father   . Cancer Mother     Social History Social History  Substance Use Topics  . Smoking status: Former Smoker    Packs/day: 1.00    Years: 30.00    Types: Cigarettes    Quit date: 03/23/1989  . Smokeless tobacco: Never Used  . Alcohol use No    Allergies  Allergen Reactions  . Jardiance [Empagliflozin] Rash    Current Outpatient Prescriptions  Medication Sig Dispense Refill  . aspirin EC 81 MG tablet Take 81 mg by mouth daily.     . carvedilol (COREG) 25 MG tablet Take 1 tablet (25 mg total) by mouth 2 (two) times daily with a meal. 180 tablet 4  . cloNIDine (CATAPRES) 0.1 MG tablet Take 1 tablet (0.1 mg total) by mouth 2 (two) times daily. 180 tablet 4  . Dulaglutide 0.75 MG/0.5ML SOPN Inject into the skin.    . fenofibrate (TRICOR) 145 MG tablet Take 1 tablet (145 mg total) by mouth at bedtime. 90 tablet 4  . HUMALOG KWIKPEN 100 UNIT/ML KiwkPen INJECT 10 UNITS (0.1MLS) SUBCUTANEOUSLY THREE TIMES A DAY 5 pen  12  . Insulin Glargine (TOUJEO SOLOSTAR) 300 UNIT/ML SOPN Inject 40-60 Units into the skin daily. 6 pen 12  . naproxen (NAPROSYN) 500 MG tablet Take 500 mg by mouth 2 (two) times daily with a meal.    . Olmesartan-Amlodipine-HCTZ 40-10-25 MG TABS Take 1 tablet by mouth daily. 90 tablet 4  . rosuvastatin (CRESTOR) 40 MG tablet Take 1 tablet (40 mg total) by mouth at bedtime. 90 tablet 4  . polyethylene glycol powder (GLYCOLAX/MIRALAX) powder Take 255 g by mouth once. 255 grams one bottle for colonoscopy prep 255 g 0  . valACYclovir (VALTREX) 500 MG tablet Take by mouth.     No current facility-administered medications for this visit.     Review of Systems Review of  Systems  Constitutional: Negative.   Respiratory: Negative.   Cardiovascular: Negative.   Gastrointestinal: Positive for constipation.    Blood pressure (!) 142/82, pulse 78, resp. rate 16, height 6' (1.829 m), weight (!) 317 lb (143.8 kg).  Physical Exam Physical Exam  Constitutional: He is oriented to person, place, and time. He appears well-developed and well-nourished.  Eyes: Conjunctivae are normal. No scleral icterus.  Cardiovascular: Normal rate, regular rhythm and normal heart sounds.   Pulmonary/Chest: Effort normal and breath sounds normal.  Abdominal: Soft. Bowel sounds are normal. There is no tenderness.  Neurological: He is alert and oriented to person, place, and time.  Skin: Skin is warm and dry.    Data Reviewed 2012 colonoscopy showed a tubular adenoma the cecum and ascending colon.    Assessment    Past history: Cancer and more recently colonic polyps.  Worsening gastroesophageal reflux possibility related to nonsteroidal use.    Plan         Upper endoscopy will be performed at the same day of colonoscopy.   Colonoscopy with possible biopsy/polypectomy prn: Information regarding the procedure, including its potential risks and complications (including but not limited to perforation of the bowel, which may require emergency surgery to repair, and bleeding) was verbally given to the patient. Educational information regarding lower intestinal endoscopy was given to the patient. Written instructions for how to complete the bowel prep using Miralax were provided. The importance of drinking ample fluids to avoid dehydration as a result of the prep emphasized.  Patient has been scheduled for an upper and lower endoscopy on 02-12-16 at [facility]. This patient has been asked to take 10 oz. citrate magnesia two days prior to colonoscopy (no change in medication that day). Also, day of prep and procedure patient will decrease Insulin Glargine to 20 units and decrease  Humalog to 5 units three times daily.   Robert Bellow 01/06/2016, 9:59 PM

## 2016-01-07 ENCOUNTER — Other Ambulatory Visit (INDEPENDENT_AMBULATORY_CARE_PROVIDER_SITE_OTHER): Payer: Self-pay | Admitting: Vascular Surgery

## 2016-01-07 DIAGNOSIS — M79604 Pain in right leg: Secondary | ICD-10-CM

## 2016-01-07 DIAGNOSIS — I739 Peripheral vascular disease, unspecified: Secondary | ICD-10-CM

## 2016-01-08 ENCOUNTER — Encounter (INDEPENDENT_AMBULATORY_CARE_PROVIDER_SITE_OTHER): Payer: Self-pay | Admitting: Vascular Surgery

## 2016-01-08 ENCOUNTER — Ambulatory Visit (INDEPENDENT_AMBULATORY_CARE_PROVIDER_SITE_OTHER): Payer: Medicare Other

## 2016-01-08 ENCOUNTER — Ambulatory Visit (INDEPENDENT_AMBULATORY_CARE_PROVIDER_SITE_OTHER): Payer: Medicare Other | Admitting: Vascular Surgery

## 2016-01-08 VITALS — BP 153/81 | HR 65 | Resp 16 | Ht 71.0 in | Wt 319.0 lb

## 2016-01-08 DIAGNOSIS — M79604 Pain in right leg: Secondary | ICD-10-CM

## 2016-01-08 DIAGNOSIS — I739 Peripheral vascular disease, unspecified: Secondary | ICD-10-CM | POA: Diagnosis not present

## 2016-01-08 DIAGNOSIS — M79609 Pain in unspecified limb: Secondary | ICD-10-CM

## 2016-01-08 DIAGNOSIS — I10 Essential (primary) hypertension: Secondary | ICD-10-CM | POA: Diagnosis not present

## 2016-01-08 DIAGNOSIS — E119 Type 2 diabetes mellitus without complications: Secondary | ICD-10-CM | POA: Diagnosis not present

## 2016-01-08 DIAGNOSIS — I70213 Atherosclerosis of native arteries of extremities with intermittent claudication, bilateral legs: Secondary | ICD-10-CM

## 2016-01-08 DIAGNOSIS — E785 Hyperlipidemia, unspecified: Secondary | ICD-10-CM | POA: Diagnosis not present

## 2016-01-08 NOTE — Progress Notes (Signed)
Subjective:    Patient ID: Justin Griffith, male    DOB: [birth year], 75 y.o.   MRN: [id number] Chief Complaint  Patient presents with  . Re-evaluation    Ultrasound follow up    Patient presents for three month lower extremity atherosclerotic disease follow up. The patient is s/p RLE angiogram with three vessel angioplasty on 09/04/2015. The patient underwent an ABI which showed Right ABI: 0.84 and Left 0.96 (on 08/26/15 Right ABI: 0.82 and Left 1.10). A right lower extremity arterial duplex is notable for triphasic blood flow distally with no significant hemodynamic stenosis noted. The patient denies any claudication like symptoms, rest pain or ulcers to his feet / toes.       Review of Systems  Constitutional: Negative.   HENT: Negative.   Eyes: Negative.   Respiratory: Negative.   Cardiovascular: Negative.   Gastrointestinal: Negative.   Endocrine: Negative.   Genitourinary: Negative.   Musculoskeletal: Negative.   Skin: Negative.   Allergic/Immunologic: Negative.   Neurological: Negative.   Hematological: Negative.   Psychiatric/Behavioral: Negative.       Objective:   Physical Exam  Constitutional: He is oriented to person, place, and time. He appears well-developed and well-nourished.  HENT:  Head: Normocephalic and atraumatic.  Eyes: Conjunctivae and EOM are normal. Pupils are equal, round, and reactive to light.  Neck: Normal range of motion.  Cardiovascular: Normal rate, regular rhythm, normal heart sounds and intact distal pulses.   Pulses:      Radial pulses are 2+ on the right side, and 2+ on the left side.       Dorsalis pedis pulses are 2+ on the right side, and 2+ on the left side.       Posterior tibial pulses are 2+ on the right side, and 2+ on the left side.  Pulmonary/Chest: Effort normal and breath sounds normal.  Abdominal: Soft. Bowel sounds are normal.  Musculoskeletal: Normal range of motion. He exhibits edema.  Neurological: He is alert and  oriented to person, place, and time.  Skin: Skin is warm and dry.  Psychiatric: He has a normal mood and affect. His behavior is normal. Judgment and thought content normal.   BP (!) 153/81 (BP Location: Right Arm)   Pulse 65   Resp 16   Ht 5\' 11"  (1.803 m)   Wt (!) 319 lb (144.7 kg)   BMI 44.49 kg/m   Past Medical History:  Diagnosis Date  . Arthritis   . Asthma 2000  . Back pain   . Colon polyp 2012  . Diabetes mellitus without complication ([place])   . Hernia 2000  . Hyperlipidemia   . Hypertension   . Personal history of malignant neoplasm of large intestine   . Personal history of tobacco use, presenting hazards to health   . Sleep apnea   . Special screening for malignant neoplasms, colon    Social History   Social History  . Marital status: Married    Spouse name: N/A  . Number of children: N/A  . Years of education: N/A   Occupational History  . Not on file.   Social History Main Topics  . Smoking status: Former Smoker    Packs/day: 1.00    Years: 30.00    Types: Cigarettes    Quit date: 03/23/1989  . Smokeless tobacco: Never Used  . Alcohol use No  . Drug use: No  . Sexual activity: Not on file   Other Topics Concern  . Not  on file   Social History Narrative  . No narrative on file   Past Surgical History:  Procedure Laterality Date  . CARDIAC CATHETERIZATION Left 09/25/2015   Procedure: Left Heart Cath and Coronary Angiography;  Surgeon: Yolonda Kida, MD;  Location: [place] [facility];  Service: Cardiovascular;  Laterality: Left;  . CHOLECYSTECTOMY  1970  . COLON SURGERY  07-16-99   sigmoid colon resection with primary anastomosis, chemotherapy for metastatic disease  . COLONOSCOPY  2001, 2012   Dr Bary Castilla, tubular adenoma of the cecum and ascending colon in 2012.  Marland Kitchen HERNIA REPAIR Right    right inguinal hernia repair  . JOINT REPLACEMENT Bilateral    knee replacement  . KNEE SURGERY Bilateral 2010  . MYRINGOTOMY WITH TUBE PLACEMENT  Bilateral 08/16/2014   Procedure: MYRINGOTOMY WITH TUBE PLACEMENT;  Surgeon: Carloyn Manner, MD;  Location: [facility];  Service: ENT;  Laterality: Bilateral;  . PERIPHERAL VASCULAR CATHETERIZATION Right 09/04/2015   Procedure: Lower Extremity Angiography;  Surgeon: Katha Cabal, MD;  Location: [place] [facility];  Service: Cardiovascular;  Laterality: Right;  . PERIPHERAL VASCULAR CATHETERIZATION  09/04/2015   Procedure: Lower Extremity Intervention;  Surgeon: Katha Cabal, MD;  Location: [place] [facility];  Service: Cardiovascular;;  . TONSILLECTOMY    . TYMPANOSTOMY TUBE PLACEMENT     Family History  Problem Relation Age of Onset  . Diabetes Father   . Cancer Mother    Allergies  Allergen Reactions  . Jardiance [Empagliflozin] Rash      Assessment & Plan:  Patient presents for three month lower extremity atherosclerotic disease follow up. The patient is s/p RLE angiogram with three vessel angioplasty on 09/04/2015. The patient underwent an ABI which showed Right ABI: 0.84 and Left 0.96 (on 08/26/15 Right ABI: 0.82 and Left 1.10). A right lower extremity arterial duplex is notable for triphasic blood flow distally with no significant hemodynamic stenosis noted. The patient denies any claudication like symptoms, rest pain or ulcers to his feet / toes.   1. PAD (peripheral artery disease) (HCC) - Stable Asymptomatic. Stable ABI. Triphasic blood flow on duplex. No intervention indicated at this time.  Patient to continue medical optimization with ASA and dyslipidemia medication. Patient to remain abstinent of tobacco use. I have discussed with the patient at length the risk factors for and pathogenesis of atherosclerotic disease and encouraged a healthy diet, regular exercise regimen and blood pressure / glucose control.  The patient was encouraged to call the office in the interim if he experiences any claudication like symptoms, rest pain or ulcers to his feet / toes.  2.  Essential hypertension - Stable Encouraged good control as its slows the progression of atherosclerotic disease  3. Diabetes mellitus without complication ([place]) - Stable Encouraged good control as its slows the progression of atherosclerotic disease  4. Hyperlipidemia, unspecified hyperlipidemia type - Stable Patient to continue medical optimization with ASA and dyslipidemia medication. Encouraged good control as its slows the progression of atherosclerotic disease  Current Outpatient Prescriptions on File Prior to Visit  Medication Sig Dispense Refill  . aspirin EC 81 MG tablet Take 81 mg by mouth daily.     . carvedilol (COREG) 25 MG tablet Take 1 tablet (25 mg total) by mouth 2 (two) times daily with a meal. 180 tablet 4  . cloNIDine (CATAPRES) 0.1 MG tablet Take 1 tablet (0.1 mg total) by mouth 2 (two) times daily. 180 tablet 4  . Dulaglutide 0.75 MG/0.5ML SOPN Inject into  the skin.    . fenofibrate (TRICOR) 145 MG tablet Take 1 tablet (145 mg total) by mouth at bedtime. 90 tablet 4  . HUMALOG KWIKPEN 100 UNIT/ML KiwkPen INJECT 10 UNITS (0.1MLS) SUBCUTANEOUSLY THREE TIMES A DAY 5 pen 12  . Insulin Glargine (TOUJEO SOLOSTAR) 300 UNIT/ML SOPN Inject 40-60 Units into the skin daily. 6 pen 12  . naproxen (NAPROSYN) 500 MG tablet Take 500 mg by mouth 2 (two) times daily with a meal.    . Olmesartan-Amlodipine-HCTZ 40-10-25 MG TABS Take 1 tablet by mouth daily. 90 tablet 4  . rosuvastatin (CRESTOR) 40 MG tablet Take 1 tablet (40 mg total) by mouth at bedtime. 90 tablet 4  . valACYclovir (VALTREX) 500 MG tablet Take by mouth.     No current facility-administered medications on file prior to visit.     There are no Patient Instructions on file for this visit. Return in about 6 months (around 07/08/2016) for ABI and RLE Arterial Duplex (Six Month Follow Up).   KIMBERLY A STEGMAYER, PA-C

## 2016-01-20 DIAGNOSIS — Z794 Long term (current) use of insulin: Secondary | ICD-10-CM | POA: Diagnosis not present

## 2016-01-20 DIAGNOSIS — Z23 Encounter for immunization: Secondary | ICD-10-CM | POA: Diagnosis not present

## 2016-01-20 DIAGNOSIS — Z79899 Other long term (current) drug therapy: Secondary | ICD-10-CM | POA: Diagnosis not present

## 2016-01-20 DIAGNOSIS — E1165 Type 2 diabetes mellitus with hyperglycemia: Secondary | ICD-10-CM | POA: Diagnosis not present

## 2016-01-30 ENCOUNTER — Ambulatory Visit (INDEPENDENT_AMBULATORY_CARE_PROVIDER_SITE_OTHER): Payer: Medicare Other | Admitting: Family Medicine

## 2016-01-30 ENCOUNTER — Encounter: Payer: Self-pay | Admitting: Family Medicine

## 2016-01-30 VITALS — BP 128/62 | HR 64 | Temp 97.8°F | Ht 70.7 in | Wt 319.8 lb

## 2016-01-30 DIAGNOSIS — Z6841 Body Mass Index (BMI) 40.0 and over, adult: Secondary | ICD-10-CM

## 2016-01-30 DIAGNOSIS — I739 Peripheral vascular disease, unspecified: Secondary | ICD-10-CM

## 2016-01-30 DIAGNOSIS — Z23 Encounter for immunization: Secondary | ICD-10-CM

## 2016-01-30 DIAGNOSIS — I10 Essential (primary) hypertension: Secondary | ICD-10-CM | POA: Diagnosis not present

## 2016-01-30 DIAGNOSIS — E119 Type 2 diabetes mellitus without complications: Secondary | ICD-10-CM | POA: Diagnosis not present

## 2016-01-30 DIAGNOSIS — E785 Hyperlipidemia, unspecified: Secondary | ICD-10-CM | POA: Diagnosis not present

## 2016-01-30 LAB — LP+ALT+AST PICCOLO, WAIVED
ALT (SGPT) Piccolo, Waived: 33 U/L (ref 10–47)
AST (SGOT) Piccolo, Waived: 35 U/L (ref 11–38)
Chol/HDL Ratio Piccolo,Waive: 4.6 mg/dL
Cholesterol Piccolo, Waived: 98 mg/dL (ref <200)
HDL Chol Piccolo, Waived: 22 mg/dL — ABNORMAL LOW (ref >59)
LDL Chol Calc Piccolo Waived: 11 mg/dL (ref <100)
Triglycerides Piccolo,Waived: 327 mg/dL — ABNORMAL HIGH (ref <150)
VLDL Chol Calc Piccolo,Waive: 65 mg/dL — ABNORMAL HIGH (ref <30)

## 2016-01-30 LAB — MICROALBUMIN, URINE WAIVED
Creatinine, Urine Waived: 50 mg/dL (ref 10–300)
Microalb, Ur Waived: 30 mg/L — ABNORMAL HIGH (ref 0–19)

## 2016-01-30 NOTE — Assessment & Plan Note (Signed)
Followed by endo.  

## 2016-01-30 NOTE — Assessment & Plan Note (Signed)
Discussed diet nutrition weight loss 

## 2016-01-30 NOTE — Assessment & Plan Note (Signed)
The current medical regimen is effective;  continue present plan and medications.  

## 2016-01-30 NOTE — Progress Notes (Signed)
BP 128/62 (BP Location: Left Arm)   Pulse 64   Temp 97.8 F (36.6 C)   Ht 5' 10.7" (1.796 m)   Wt (!) 319 lb 12.8 oz (145.1 kg)   SpO2 93%   BMI 44.98 kg/m    Subjective:    Patient ID: Justin Griffith, male    DOB: February 13, 1941, 75 y.o.   MRN: [id number]  HPI: Justin Griffith is a 75 y.o. male  Chief Complaint  Patient presents with  . Hyperlipidemia  . Hypertension  . Diabetes    pt states he has eye exam scheduled in Decemeber    Patient follow-up doing well Diabetes followed by endocrinology and improving Patient's changed his diet and eating a lot of vegetables has gained a little bit of weight but eating healthier. Is now taking his cholesterol medicines faithfully and cholesterol is low   Relevant past medical, surgical, family and social history reviewed and updated as indicated. Interim medical history since our last visit reviewed. Allergies and medications reviewed and updated.  Review of Systems  Constitutional: Negative.   Respiratory: Negative.   Cardiovascular: Negative.     Per HPI unless specifically indicated above     Objective:    BP 128/62 (BP Location: Left Arm)   Pulse 64   Temp 97.8 F (36.6 C)   Ht 5' 10.7" (1.796 m)   Wt (!) 319 lb 12.8 oz (145.1 kg)   SpO2 93%   BMI 44.98 kg/m   Wt Readings from Last 3 Encounters:  01/30/16 (!) 319 lb 12.8 oz (145.1 kg)  01/08/16 (!) 319 lb (144.7 kg)  01/06/16 (!) 317 lb (143.8 kg)    Physical Exam  Constitutional: He is oriented to person, place, and time. He appears well-developed and well-nourished. No distress.  HENT:  Head: Normocephalic and atraumatic.  Right Ear: Hearing normal.  Left Ear: Hearing normal.  Nose: Nose normal.  Eyes: Conjunctivae and lids are normal. Right eye exhibits no discharge. Left eye exhibits no discharge. No scleral icterus.  Cardiovascular: Normal rate, regular rhythm and normal heart sounds.   Pulmonary/Chest: Effort normal and breath sounds normal. No  respiratory distress.  Musculoskeletal: Normal range of motion.  Neurological: He is alert and oriented to person, place, and time.  Skin: Skin is intact. No rash noted.  Psychiatric: He has a normal mood and affect. His speech is normal and behavior is normal. Judgment and thought content normal. Cognition and memory are normal.    Results for orders placed or performed during the hospital encounter of 09/25/15  Glucose, capillary  Result Value Ref Range   Glucose-Capillary 149 (H) 65 - 99 mg/dL      Assessment & Plan:   Problem List Items Addressed This Visit      Cardiovascular and Mediastinum   Hypertension - Primary    The current medical regimen is effective;  continue present plan and medications.       Relevant Orders   Basic metabolic panel   Microalbumin, Urine Waived   PAD (peripheral artery disease) ([place])    The current medical regimen is effective;  continue present plan and medications.         Endocrine   Diabetes mellitus without complication ([place])    Followed by endo        Other   Hyperlipidemia    LDL reported as 2 Will recheck      Relevant Orders   LP+ALT+AST [place], Waived   BMI 40.0-44.9, adult (  [place])    Discussed diet nutrition weight loss       Other Visit Diagnoses    Need for pneumococcal vaccination       Relevant Orders   Pneumococcal polysaccharide vaccine 23-valent greater than or equal to 2yo subcutaneous/IM (Completed)       Follow up plan: Return in about 6 months (around 07/29/2016) for Physical Exam.

## 2016-01-30 NOTE — Patient Instructions (Signed)
Pneumococcal Polysaccharide Vaccine: What You Need to Know  1. Why get vaccinated?  Vaccination can protect older adults (and some children and younger adults) from pneumococcal disease.  Pneumococcal disease is caused by bacteria that can spread from person to person through close contact. It can cause ear infections, and it can also lead to more serious infections of the:   · Lungs (pneumonia),  · Blood (bacteremia), and  · Covering of the brain and spinal cord (meningitis). Meningitis can cause deafness and brain damage, and it can be fatal.  Anyone can get pneumococcal disease, but children under 2 years of age, people with certain medical conditions, adults over 65 years of age, and cigarette smokers are at the highest risk.  About 18,000 older adults die each year from pneumococcal disease in the [place].  Treatment of pneumococcal infections with penicillin and other drugs used to be more effective. But some strains of the disease have become resistant to these drugs. This makes prevention of the disease, through vaccination, even more important.  2. Pneumococcal polysaccharide vaccine (PPSV23)  Pneumococcal polysaccharide vaccine (PPSV23) protects against 23 types of pneumococcal bacteria. It will not prevent all pneumococcal disease.  PPSV23 is recommended for:  · All adults 65 years of age and older,  · Anyone 2 through 76 years of age with certain long-term health problems,  · Anyone 2 through 76 years of age with a weakened immune system,  · Adults 19 through 76 years of age who smoke cigarettes or have asthma.  Most people need only one dose of PPSV. A second dose is recommended for certain high-risk groups. People 65 and older should get a dose even if they have gotten one or more doses of the vaccine before they turned 65.  Your healthcare provider can give you more information about these recommendations.  Most healthy adults develop protection within 2 to 3 weeks of getting the shot.  3. Some  people should not get this vaccine  · Anyone who has had a life-threatening allergic reaction to PPSV should not get another dose.  · Anyone who has a severe allergy to any component of PPSV should not receive it. Tell your provider if you have any severe allergies.  · Anyone who is moderately or severely ill when the shot is scheduled may be asked to wait until they recover before getting the vaccine. Someone with a mild illness can usually be vaccinated.  · Children less than 2 years of age should not receive this vaccine.  · There is no evidence that PPSV is harmful to either a pregnant woman or to her fetus. However, as a precaution, women who need the vaccine should be vaccinated before becoming pregnant, if possible.  4. Risks of a vaccine reaction  With any medicine, including vaccines, there is a chance of side effects. These are usually mild and go away on their own, but serious reactions are also possible.  About half of people who get PPSV have mild side effects, such as redness or pain where the shot is given, which go away within about two days.  Less than 1 out of 100 people develop a fever, muscle aches, or more severe local reactions.  Problems that could happen after any vaccine:  · People sometimes faint after a medical procedure, including vaccination. Sitting or lying down for about 15 minutes can help prevent fainting, and injuries caused by a fall. Tell your doctor if you feel dizzy, or have vision changes or   ringing in the ears.  · Some people get severe pain in the shoulder and have difficulty moving the arm where a shot was given. This happens very rarely.  · Any medication can cause a severe allergic reaction. Such reactions from a vaccine are very rare, estimated at about 1 in a million doses, and would happen within a few minutes to a few hours after the vaccination.  As with any medicine, there is a very remote chance of a vaccine causing a serious injury or death.  The safety of  vaccines is always being monitored. For more information, visit: www.cdc.gov/vaccinesafety/  5. What if there is a serious reaction?  What should I look for?  Look for anything that concerns you, such as signs of a severe allergic reaction, very high fever, or unusual behavior.   Signs of a severe allergic reaction can include hives, swelling of the face and throat, difficulty breathing, a fast heartbeat, dizziness, and weakness. These would usually start a few minutes to a few hours after the vaccination.  What should I do?  If you think it is a severe allergic reaction or other emergency that can't wait, call 9-1-1 or get to the nearest hospital. Otherwise, call your doctor.  Afterward, the reaction should be reported to the Vaccine Adverse Event Reporting System (VAERS). Your doctor might file this report, or you can do it yourself through the VAERS web site at www.vaers.hhs.gov, or by calling 1-800-822-7967.   VAERS does not give medical advice.  6. How can I learn more?  · Ask your doctor. He or she can give you the vaccine package insert or suggest other sources of information.  · Call your local or state health department.  · Contact the Centers for Disease Control and Prevention (CDC):    Call 1-800-232-4636 (1-800-CDC-INFO) or    Visit CDC's website at www.cdc.gov/vaccines  CDC Pneumococcal Polysaccharide Vaccine VIS (07/14/13)     This information is not intended to replace advice given to you by your health care provider. Make sure you discuss any questions you have with your health care provider.     Document Released: 01/04/2006 Document Revised: 03/30/2014 Document Reviewed: 07/17/2013  Elsevier Interactive Patient Education ©2016 [employer]

## 2016-01-30 NOTE — Assessment & Plan Note (Signed)
LDL reported as 2 Will recheck

## 2016-01-31 LAB — BASIC METABOLIC PANEL
BUN/Creatinine Ratio: 15 (ref 10–24)
BUN: 18 mg/dL (ref 8–27)
CO2: 28 mmol/L (ref 18–29)
Calcium: 10.4 mg/dL — ABNORMAL HIGH (ref 8.6–10.2)
Chloride: 99 mmol/L (ref 96–106)
Creatinine, Ser: 1.19 mg/dL (ref 0.76–1.27)
GFR calc Af Amer: 68 mL/min/{1.73_m2} (ref >59)
GFR calc non Af Amer: 59 mL/min/{1.73_m2} — ABNORMAL LOW (ref >59)
Glucose: 203 mg/dL — ABNORMAL HIGH (ref 65–99)
Potassium: 4.6 mmol/L (ref 3.5–5.2)
Sodium: 139 mmol/L (ref 134–144)

## 2016-02-03 ENCOUNTER — Encounter: Payer: Self-pay | Admitting: Family Medicine

## 2016-02-06 ENCOUNTER — Telehealth: Payer: Self-pay | Admitting: *Deleted

## 2016-02-06 NOTE — Telephone Encounter (Signed)
Patient was contacted today and confirms no medication changes since last office visit. Also, states he has Miralax prescription.   We will proceed with upper and lower endoscopy at [facility] on 02-12-16.  This patient was instructed to call the office should he have further questions.

## 2016-02-12 ENCOUNTER — Encounter
Admission: RE | Disposition: A | Discharge status: Home / Self Care | Payer: Self-pay | Source: Ambulatory Visit | Attending: General Surgery

## 2016-02-12 ENCOUNTER — Ambulatory Visit
Admission: RE | Admit: 2016-02-12 | Discharge: 2016-02-12 | Disposition: A | Discharge status: Home / Self Care | Payer: Medicare Other | Source: Ambulatory Visit | Attending: General Surgery | Admitting: General Surgery

## 2016-02-12 ENCOUNTER — Ambulatory Visit: Payer: Medicare Other | Admitting: Anesthesiology

## 2016-02-12 DIAGNOSIS — D122 Benign neoplasm of ascending colon: Secondary | ICD-10-CM | POA: Insufficient documentation

## 2016-02-12 DIAGNOSIS — Z7982 Long term (current) use of aspirin: Secondary | ICD-10-CM | POA: Diagnosis not present

## 2016-02-12 DIAGNOSIS — J449 Chronic obstructive pulmonary disease, unspecified: Secondary | ICD-10-CM | POA: Insufficient documentation

## 2016-02-12 DIAGNOSIS — D123 Benign neoplasm of transverse colon: Secondary | ICD-10-CM | POA: Diagnosis not present

## 2016-02-12 DIAGNOSIS — Z96653 Presence of artificial knee joint, bilateral: Secondary | ICD-10-CM | POA: Diagnosis not present

## 2016-02-12 DIAGNOSIS — I739 Peripheral vascular disease, unspecified: Secondary | ICD-10-CM | POA: Insufficient documentation

## 2016-02-12 DIAGNOSIS — Z9049 Acquired absence of other specified parts of digestive tract: Secondary | ICD-10-CM | POA: Diagnosis not present

## 2016-02-12 DIAGNOSIS — R12 Heartburn: Secondary | ICD-10-CM | POA: Diagnosis not present

## 2016-02-12 DIAGNOSIS — I10 Essential (primary) hypertension: Secondary | ICD-10-CM | POA: Diagnosis not present

## 2016-02-12 DIAGNOSIS — Z85038 Personal history of other malignant neoplasm of large intestine: Secondary | ICD-10-CM | POA: Diagnosis not present

## 2016-02-12 DIAGNOSIS — Z794 Long term (current) use of insulin: Secondary | ICD-10-CM | POA: Insufficient documentation

## 2016-02-12 DIAGNOSIS — E785 Hyperlipidemia, unspecified: Secondary | ICD-10-CM | POA: Insufficient documentation

## 2016-02-12 DIAGNOSIS — E119 Type 2 diabetes mellitus without complications: Secondary | ICD-10-CM | POA: Diagnosis not present

## 2016-02-12 DIAGNOSIS — G4730 Sleep apnea, unspecified: Secondary | ICD-10-CM | POA: Diagnosis not present

## 2016-02-12 DIAGNOSIS — Z79899 Other long term (current) drug therapy: Secondary | ICD-10-CM | POA: Insufficient documentation

## 2016-02-12 DIAGNOSIS — K2970 Gastritis, unspecified, without bleeding: Secondary | ICD-10-CM | POA: Diagnosis not present

## 2016-02-12 DIAGNOSIS — D120 Benign neoplasm of cecum: Secondary | ICD-10-CM | POA: Insufficient documentation

## 2016-02-12 DIAGNOSIS — Z1211 Encounter for screening for malignant neoplasm of colon: Secondary | ICD-10-CM | POA: Insufficient documentation

## 2016-02-12 DIAGNOSIS — K210 Gastro-esophageal reflux disease with esophagitis: Secondary | ICD-10-CM | POA: Diagnosis not present

## 2016-02-12 DIAGNOSIS — M1990 Unspecified osteoarthritis, unspecified site: Secondary | ICD-10-CM | POA: Diagnosis not present

## 2016-02-12 DIAGNOSIS — Z86010 Personal history of colonic polyps: Secondary | ICD-10-CM | POA: Diagnosis not present

## 2016-02-12 DIAGNOSIS — K317 Polyp of stomach and duodenum: Secondary | ICD-10-CM | POA: Insufficient documentation

## 2016-02-12 DIAGNOSIS — Z87891 Personal history of nicotine dependence: Secondary | ICD-10-CM | POA: Diagnosis not present

## 2016-02-12 DIAGNOSIS — K2950 Unspecified chronic gastritis without bleeding: Secondary | ICD-10-CM | POA: Diagnosis not present

## 2016-02-12 DIAGNOSIS — K209 Esophagitis, unspecified: Secondary | ICD-10-CM | POA: Diagnosis not present

## 2016-02-12 DIAGNOSIS — K219 Gastro-esophageal reflux disease without esophagitis: Secondary | ICD-10-CM

## 2016-02-12 DIAGNOSIS — K635 Polyp of colon: Secondary | ICD-10-CM | POA: Diagnosis not present

## 2016-02-12 HISTORY — DX: Chronic obstructive pulmonary disease, unspecified: J44.9

## 2016-02-12 HISTORY — PX: ESOPHAGOGASTRODUODENOSCOPY (EGD) WITH PROPOFOL: SHX5813

## 2016-02-12 HISTORY — PX: COLONOSCOPY WITH PROPOFOL: SHX5780

## 2016-02-12 LAB — GLUCOSE, CAPILLARY: Glucose-Capillary: 107 mg/dL — ABNORMAL HIGH (ref 65–99)

## 2016-02-12 SURGERY — COLONOSCOPY WITH PROPOFOL
Anesthesia: General

## 2016-02-12 MED ORDER — PROPOFOL 500 MG/50ML IV EMUL
INTRAVENOUS | Status: DC | PRN
  Administered 2016-02-12: 160 ug/kg/min via INTRAVENOUS

## 2016-02-12 MED ORDER — PROPOFOL 10 MG/ML IV BOLUS
INTRAVENOUS | Status: DC | PRN
  Administered 2016-02-12: 100 mg via INTRAVENOUS

## 2016-02-12 MED ORDER — EPHEDRINE SULFATE 50 MG/ML IJ SOLN
INTRAMUSCULAR | Status: DC | PRN
  Administered 2016-02-12 (×3): 5 mg via INTRAVENOUS

## 2016-02-12 MED ORDER — SODIUM CHLORIDE 0.9 % IV SOLN
INTRAVENOUS | Status: DC
  Administered 2016-02-12 (×2): via INTRAVENOUS

## 2016-02-12 MED ORDER — LIDOCAINE 2% (20 MG/ML) 5 ML SYRINGE
INTRAMUSCULAR | Status: DC | PRN
  Administered 2016-02-12: 40 mg via INTRAVENOUS

## 2016-02-12 MED ORDER — PHENYLEPHRINE HCL 10 MG/ML IJ SOLN
INTRAMUSCULAR | Status: DC | PRN
  Administered 2016-02-12 (×4): 100 ug via INTRAVENOUS

## 2016-02-12 MED ORDER — FENTANYL CITRATE (PF) 100 MCG/2ML IJ SOLN
INTRAMUSCULAR | Status: DC | PRN
  Administered 2016-02-12: 50 ug via INTRAVENOUS

## 2016-02-12 MED ORDER — MIDAZOLAM HCL 5 MG/5ML IJ SOLN
INTRAMUSCULAR | Status: DC | PRN
  Administered 2016-02-12: 1 mg via INTRAVENOUS

## 2016-02-12 NOTE — Op Note (Signed)
[name]astroenterology Patient Name: Justin Griffith Procedure Date: 02/12/2016 8:11 AM MRN: [id number] Account #: [id number] Date of Birth: [birth year] Admit Type: Outpatient Age: 75 Room: [facility] ENDO ROOM 1 Gender: Male Note Status: Finalized Procedure:            Upper GI endoscopy Indications:          Heartburn Providers:            Robert Bellow, MD Referring MD:         Guadalupe Maple, MD (Referring MD) Medicines:            Monitored Anesthesia Care Complications:        No immediate complications. Procedure:            Pre-Anesthesia Assessment:                       - Prior to the procedure, a History and Physical was                        performed, and patient medications, allergies and                        sensitivities were reviewed. The patient's tolerance of                        previous anesthesia was reviewed.                       - The risks and benefits of the procedure and the                        sedation options and risks were discussed with the                        patient. All questions were answered and informed                        consent was obtained.                       After obtaining informed consent, the endoscope was                        passed under direct vision. Throughout the procedure,                        the patient's blood pressure, pulse, and oxygen                        saturations were monitored continuously. The Endoscope                        was introduced through the mouth, and advanced to the                        second part of duodenum. The upper GI endoscopy was                        accomplished without difficulty. The patient tolerated  the procedure well. Findings:      The esophagus was normal. Biopsies at 44 cm obtained.      A single 7 mm sessile polyp with no bleeding and no stigmata of recent       bleeding was found at the gastroesophageal junction.  Biopsies were taken       with a cold forceps for histology.      Diffuse mild inflammation characterized by erythema was found in the       gastric body and in the gastric antrum. Biopsies were taken with a cold       forceps for histology.      The examined duodenum was normal.      Multiple dispersed, less than 5 mm non-bleeding erosions were found in       the gastric body and in the gastric antrum. There were no stigmata of       recent bleeding. Biopsies were taken with a cold forceps for histology. Impression:           - Normal esophagus.                       - A single gastroesophageal junction polyp. Biopsied.                       - Chronic gastritis. Biopsied.                       - Normal examined duodenum. Recommendation:       - Perform a colonoscopy today.                       - Telephone endoscopist for pathology results in 1 week. Procedure Code(s):    --- Professional ---                       [phone], Esophagogastroduodenoscopy, flexible, transoral;                        with biopsy, single or multiple Diagnosis Code(s):    --- Professional ---                       K31.7, Polyp of stomach and duodenum                       K29.50, Unspecified chronic gastritis without bleeding                       R12, Heartburn CPT copyright 2016 American Medical Association. All rights reserved. The codes documented in this report are preliminary and upon coder review may  be revised to meet current compliance requirements. Robert Bellow, MD 02/12/2016 8:30:51 AM This report has been signed electronically. Number of Addenda: 0 Note Initiated On: 02/12/2016 8:11 AM      [facility]

## 2016-02-12 NOTE — Transfer of Care (Signed)
Immediate Anesthesia Transfer of Care Note  Patient: Justin Griffith  Procedure(s) Performed: Procedure(s): COLONOSCOPY WITH PROPOFOL (N/A) ESOPHAGOGASTRODUODENOSCOPY (EGD) WITH PROPOFOL (N/A)  Patient Location: PACU and Endoscopy Unit  Anesthesia Type:General  Level of Consciousness: sedated  Airway & Oxygen Therapy: Patient Spontanous Breathing and Patient connected to nasal cannula oxygen  Post-op Assessment: Report given to RN and Post -op Vital signs reviewed and stable  Post vital signs: Reviewed and stable  Last Vitals:  Vitals:   02/12/16 0751  BP: (!) 160/67  Pulse: 63  Resp: (!) 22  Temp: 36.2 C    Last Pain:  Vitals:   02/12/16 0751  TempSrc: Tympanic         Complications: No apparent anesthesia complications

## 2016-02-12 NOTE — Anesthesia Postprocedure Evaluation (Signed)
Anesthesia Post Note  Patient: Justin Griffith  Procedure(s) Performed: Procedure(s) (LRB): COLONOSCOPY WITH PROPOFOL (N/A) ESOPHAGOGASTRODUODENOSCOPY (EGD) WITH PROPOFOL (N/A)  Patient location during evaluation: PACU Anesthesia Type: General Level of consciousness: awake and alert and oriented Pain management: pain level controlled Vital Signs Assessment: post-procedure vital signs reviewed and stable Respiratory status: spontaneous breathing, nonlabored ventilation and respiratory function stable Cardiovascular status: blood pressure returned to baseline and stable Postop Assessment: no signs of nausea or vomiting Anesthetic complications: no    Last Vitals:  Vitals:   02/12/16 0923 02/12/16 0933  BP: (!) 94/51 (!) 101/53  Pulse: (!) 59 62  Resp: 15 14  Temp:      Last Pain:  Vitals:   02/12/16 0903  TempSrc: Tympanic                 Dilraj Killgore

## 2016-02-12 NOTE — Anesthesia Preprocedure Evaluation (Addendum)
Anesthesia Evaluation  Patient identified by MRN, date of birth, ID band Patient awake    Reviewed: Allergy & Precautions, NPO status , Patient's Chart, lab work & pertinent test results, reviewed documented beta blocker date and time   History of Anesthesia Complications Negative for: history of anesthetic complications  Airway Mallampati: II  TM Distance: >3 FB Neck ROM: Full    Dental  (+) Lower Dentures, Upper Dentures   Pulmonary asthma , sleep apnea and Continuous Positive Airway Pressure Ventilation , COPD, former smoker,    breath sounds clear to auscultation- rhonchi (-) wheezing      Cardiovascular hypertension, Pt. on medications + Peripheral Vascular Disease  (-) CAD, (-) Past MI and (-) Cardiac Stents  Rhythm:Regular Rate:Normal - Systolic murmurs and - Diastolic murmurs L heart cath 09/25/15:  Mid LAD lesion, 50% stenosed. The lesion was not previously treated.  Normal L:VF 60%  successful operation diagnostic cardiac cath with moderate coronary disease would recommend medical therapy  The left ventricular systolic function is normal.  Echo 08/28/14: NORMAL LEFT VENTRICULAR SYSTOLIC FUNCTION WITH AN ESTIMATED EF = 55 % NORMAL RIGHT VENTRICULAR SYSTOLIC FUNCTION MILD TRICUSPID AND MITRAL VALVE INSUFFICIENCY NO VALVULAR STENOSIS MILD LA ENLARGEMENT SCLEROTIC AORTIC VALVE   Neuro/Psych negative neurological ROS  negative psych ROS   GI/Hepatic Neg liver ROS, GERD  ,  Endo/Other  diabetes, Insulin Dependent  Renal/GU negative Renal ROS     Musculoskeletal  (+) Arthritis ,   Abdominal (+) + obese,   Peds  Hematology negative hematology ROS (+)   Anesthesia Other Findings Past Medical History: No date: Arthritis 2000: Asthma No date: Back pain 2012: Colon polyp No date: Diabetes mellitus without complication ([place]) [id number]: Hernia No date: Hyperlipidemia No date: Hypertension No date: Personal  history of malignant neoplasm of larg* No date: Personal history of tobacco use, presenting ha* No date: Sleep apnea No date: Special screening for malignant neoplasms, col*   Reproductive/Obstetrics                            Anesthesia Physical Anesthesia Plan  ASA: III  Anesthesia Plan: General   Post-op Pain Management:    Induction: Intravenous  Airway Management Planned: Natural Airway  Additional Equipment:   Intra-op Plan:   Post-operative Plan:   Informed Consent: I have reviewed the patients History and Physical, chart, labs and discussed the procedure including the risks, benefits and alternatives for the proposed anesthesia with the patient or authorized representative who has indicated his/her understanding and acceptance.   Dental advisory given  Plan Discussed with: CRNA and Anesthesiologist  Anesthesia Plan Comments:         Anesthesia Quick Evaluation

## 2016-02-12 NOTE — H&P (Signed)
Justin Griffith [id number] 05/17/1940     HPI:  History of adenomatous polyp in 2012.  Some change in bowel habits last few months. No bleeding. Worsening reflux.   For EGD/Colonoscopy.   Prescriptions Prior to Admission  Medication Sig Dispense Refill Last Dose  . aspirin EC 81 MG tablet Take 81 mg by mouth daily.    02/11/2016 at Unknown time  . carvedilol (COREG) 25 MG tablet Take 1 tablet (25 mg total) by mouth 2 (two) times daily with a meal. 180 tablet 4 02/11/2016 at Unknown time  . cloNIDine (CATAPRES) 0.1 MG tablet Take 1 tablet (0.1 mg total) by mouth 2 (two) times daily. 180 tablet 4 02/11/2016 at Unknown time  . fenofibrate (TRICOR) 145 MG tablet Take 1 tablet (145 mg total) by mouth at bedtime. 90 tablet 4 02/11/2016 at Unknown time  . Insulin Glargine (TOUJEO SOLOSTAR) 300 UNIT/ML SOPN Inject 40-60 Units into the skin daily. 6 pen 12 02/11/2016 at Unknown time  . naproxen (NAPROSYN) 500 MG tablet Take 500 mg by mouth 2 (two) times daily with a meal.   02/11/2016 at Unknown time  . Olmesartan-Amlodipine-HCTZ 40-10-25 MG TABS Take 1 tablet by mouth daily. 90 tablet 4 02/11/2016 at Unknown time  . rosuvastatin (CRESTOR) 40 MG tablet Take 1 tablet (40 mg total) by mouth at bedtime. 90 tablet 4 02/11/2016 at Unknown time  . valACYclovir (VALTREX) 500 MG tablet Take by mouth.   Past Month at Unknown time  . Dulaglutide 0.75 MG/0.5ML SOPN Inject into the skin.   Taking  . HUMALOG KWIKPEN 100 UNIT/ML KiwkPen INJECT 10 UNITS (0.1MLS) SUBCUTANEOUSLY THREE TIMES A DAY 5 pen 12 Taking   Allergies  Allergen Reactions  . Farxiga [Dapagliflozin] Rash  . Jardiance [Empagliflozin] Rash   Past Medical History:  Diagnosis Date  . Arthritis   . Asthma 2000  . Back pain   . Colon polyp 2012  . COPD (chronic obstructive pulmonary disease) ([place])   . Diabetes mellitus without complication ([place])   . Hernia 2000  . Hyperlipidemia   . Hypertension   . Personal history of malignant neoplasm  of large intestine   . Personal history of tobacco use, presenting hazards to health   . Sleep apnea   . Special screening for malignant neoplasms, colon    Past Surgical History:  Procedure Laterality Date  . CARDIAC CATHETERIZATION Left 09/25/2015   Procedure: Left Heart Cath and Coronary Angiography;  Surgeon: Yolonda Kida, MD;  Location: [place] [facility];  Service: Cardiovascular;  Laterality: Left;  . CHOLECYSTECTOMY  1970  . COLON SURGERY  07-16-99   sigmoid colon resection with primary anastomosis, chemotherapy for metastatic disease  . COLONOSCOPY  2001, 2012   Dr Bary Castilla, tubular adenoma of the cecum and ascending colon in 2012.  Marland Kitchen HERNIA REPAIR Right    right inguinal hernia repair  . JOINT REPLACEMENT Bilateral    knee replacement  . KNEE SURGERY Bilateral 2010  . MYRINGOTOMY WITH TUBE PLACEMENT Bilateral 08/16/2014   Procedure: MYRINGOTOMY WITH TUBE PLACEMENT;  Surgeon: Carloyn Manner, MD;  Location: [facility];  Service: ENT;  Laterality: Bilateral;  . PERIPHERAL VASCULAR CATHETERIZATION Right 09/04/2015   Procedure: Lower Extremity Angiography;  Surgeon: Katha Cabal, MD;  Location: [place] [facility];  Service: Cardiovascular;  Laterality: Right;  . PERIPHERAL VASCULAR CATHETERIZATION  09/04/2015   Procedure: Lower Extremity Intervention;  Surgeon: Katha Cabal, MD;  Location: [place] [facility];  Service: Cardiovascular;;  .  TONSILLECTOMY    . TYMPANOSTOMY TUBE PLACEMENT     Social History   Social History  . Marital status: Married    Spouse name: N/A  . Number of children: N/A  . Years of education: N/A   Occupational History  . Not on file.   Social History Main Topics  . Smoking status: Former Smoker    Packs/day: 1.00    Years: 30.00    Types: Cigarettes    Quit date: 03/23/1989  . Smokeless tobacco: Never Used  . Alcohol use No  . Drug use: No  . Sexual activity: Not on file   Other Topics Concern  . Not on file   Social  History Narrative  . No narrative on file   Social History   Social History Narrative  . No narrative on file     ROS: Negative.     PE: HEENT: Negative. Lungs: Clear. Cardio: RR. Robert Bellow 02/12/2016   Assessment/Plan:  Proceed with planned endoscopy. COLETON STAMP [id number] 1940-08-16     HPI:  Prescriptions Prior to Admission  Medication Sig Dispense Refill Last Dose  . aspirin EC 81 MG tablet Take 81 mg by mouth daily.    02/11/2016 at Unknown time  . carvedilol (COREG) 25 MG tablet Take 1 tablet (25 mg total) by mouth 2 (two) times daily with a meal. 180 tablet 4 02/11/2016 at Unknown time  . cloNIDine (CATAPRES) 0.1 MG tablet Take 1 tablet (0.1 mg total) by mouth 2 (two) times daily. 180 tablet 4 02/11/2016 at Unknown time  . fenofibrate (TRICOR) 145 MG tablet Take 1 tablet (145 mg total) by mouth at bedtime. 90 tablet 4 02/11/2016 at Unknown time  . Insulin Glargine (TOUJEO SOLOSTAR) 300 UNIT/ML SOPN Inject 40-60 Units into the skin daily. 6 pen 12 02/11/2016 at Unknown time  . naproxen (NAPROSYN) 500 MG tablet Take 500 mg by mouth 2 (two) times daily with a meal.   02/11/2016 at Unknown time  . Olmesartan-Amlodipine-HCTZ 40-10-25 MG TABS Take 1 tablet by mouth daily. 90 tablet 4 02/11/2016 at Unknown time  . rosuvastatin (CRESTOR) 40 MG tablet Take 1 tablet (40 mg total) by mouth at bedtime. 90 tablet 4 02/11/2016 at Unknown time  . valACYclovir (VALTREX) 500 MG tablet Take by mouth.   Past Month at Unknown time  . Dulaglutide 0.75 MG/0.5ML SOPN Inject into the skin.   Taking  . HUMALOG KWIKPEN 100 UNIT/ML KiwkPen INJECT 10 UNITS (0.1MLS) SUBCUTANEOUSLY THREE TIMES A DAY 5 pen 12 Taking   Allergies  Allergen Reactions  . Farxiga [Dapagliflozin] Rash  . Jardiance [Empagliflozin] Rash   Past Medical History:  Diagnosis Date  . Arthritis   . Asthma 2000  . Back pain   . Colon polyp 2012  . COPD (chronic obstructive pulmonary disease) ([place])   .  Diabetes mellitus without complication ([place])   . Hernia 2000  . Hyperlipidemia   . Hypertension   . Personal history of malignant neoplasm of large intestine   . Personal history of tobacco use, presenting hazards to health   . Sleep apnea   . Special screening for malignant neoplasms, colon    Past Surgical History:  Procedure Laterality Date  . CARDIAC CATHETERIZATION Left 09/25/2015   Procedure: Left Heart Cath and Coronary Angiography;  Surgeon: Yolonda Kida, MD;  Location: [place] [facility];  Service: Cardiovascular;  Laterality: Left;  . CHOLECYSTECTOMY  1970  . COLON SURGERY  07-16-99   sigmoid colon  resection with primary anastomosis, chemotherapy for metastatic disease  . COLONOSCOPY  2001, 2012   Dr Bary Castilla, tubular adenoma of the cecum and ascending colon in 2012.  Marland Kitchen HERNIA REPAIR Right    right inguinal hernia repair  . JOINT REPLACEMENT Bilateral    knee replacement  . KNEE SURGERY Bilateral 2010  . MYRINGOTOMY WITH TUBE PLACEMENT Bilateral 08/16/2014   Procedure: MYRINGOTOMY WITH TUBE PLACEMENT;  Surgeon: Carloyn Manner, MD;  Location: [facility];  Service: ENT;  Laterality: Bilateral;  . PERIPHERAL VASCULAR CATHETERIZATION Right 09/04/2015   Procedure: Lower Extremity Angiography;  Surgeon: Katha Cabal, MD;  Location: [place] [facility];  Service: Cardiovascular;  Laterality: Right;  . PERIPHERAL VASCULAR CATHETERIZATION  09/04/2015   Procedure: Lower Extremity Intervention;  Surgeon: Katha Cabal, MD;  Location: [place] [facility];  Service: Cardiovascular;;  . TONSILLECTOMY    . TYMPANOSTOMY TUBE PLACEMENT     Social History   Social History  . Marital status: Married    Spouse name: N/A  . Number of children: N/A  . Years of education: N/A   Occupational History  . Not on file.   Social History Main Topics  . Smoking status: Former Smoker    Packs/day: 1.00    Years: 30.00    Types: Cigarettes    Quit date: 03/23/1989  . Smokeless  tobacco: Never Used  . Alcohol use No  . Drug use: No  . Sexual activity: Not on file   Other Topics Concern  . Not on file   Social History Narrative  . No narrative on file   Social History   Social History Narrative  . No narrative on file     ROS: Negative.     PE: HEENT: Negative. Lungs: Clear. Cardio: RR.  Assessment/Plan:  Proceed with planned endoscopy.   Robert Bellow 02/12/2016

## 2016-02-12 NOTE — Op Note (Signed)
[facility]astroenter[facility] Patient Name: Justin Griffith Procedure Date: 02/12/2016 8:10 AM MRN: [id number] Account #: [id number] Date of Birth: 1940-09-29 Admit Type: Outpatient Age: 75 Room: [facility] ENDO ROOM 1 Gender: Male Note Status: Finalized Procedure:            Colonoscopy Indications:          High risk colon cancer surveillance: Personal history                        of colon cancer, Last colonoscopy 10 years ago Providers:            Robert Bellow, MD Referring MD:         Guadalupe Maple, MD (Referring MD) Medicines:            Monitored Anesthesia Care Complications:        No immediate complications. Procedure:            Pre-Anesthesia Assessment:                       - Prior to the procedure, a History and Physical was                        performed, and patient medications, allergies and                        sensitivities were reviewed. The patient's tolerance of                        previous anesthesia was reviewed.                       - The risks and benefits of the procedure and the                        sedation options and risks were discussed with the                        patient. All questions were answered and informed                        consent was obtained.                       After obtaining informed consent, the colonoscope was                        passed under direct vision. Throughout the procedure,                        the patient's blood pressure, pulse, and oxygen                        saturations were monitored continuously. The                        Colonoscope was introduced through the anus and                        advanced to the the cecum, identified by appendiceal  orifice and ileocecal valve. The colonoscopy was                        performed without difficulty. The patient tolerated the                        procedure well. The quality of the bowel preparation                    was excellent. Findings:      Four sessile and semi-pedunculated polyps were found in the ascending       colon and cecum. The polyps were 5 to 12 mm in size. These polyps were       removed with a biopsy ( x1) and hot snare ( x3). Resection and retrieval       were complete.      Two sessile polyps were found in the hepatic flexure. The polyps were 10       to 12 mm in size. These polyps were removed with a hot snare. Resection       and retrieval were complete.      The retroflexed view of the distal rectum and anal verge was normal and       showed no anal or rectal abnormalities. Impression:           - Four 5 to 12 mm polyps in the ascending colon and in                        the cecum, removed with a hot snare. Resected and                        retrieved.                       - Two 10 to 12 mm polyps at the hepatic flexure,                        removed with a hot snare. Resected and retrieved.                       - The distal rectum and anal verge are normal on                        retroflexion view. Recommendation:       - Telephone endoscopist for pathology results in 1 week.                       - Repeat colonoscopy in 3 years for surveillance. Procedure Code(s):    --- Professional ---                       [phone], Colonoscopy, flexible; with removal of tumor(s),                        polyp(s), or other lesion(s) by snare technique Diagnosis Code(s):    --- Professional ---                       [id number], Personal history of other malignant neoplasm  of large intestine                       D12.2, Benign neoplasm of ascending colon                       D12.0, Benign neoplasm of cecum                       D12.3, Benign neoplasm of transverse colon (hepatic                        flexure or splenic flexure) CPT copyright 2016 American Medical Association. All rights reserved. The codes documented in this report are preliminary  and upon coder review may  be revised to meet current compliance requirements. Robert Bellow, MD 02/12/2016 9:01:21 AM This report has been signed electronically. Number of Addenda: 0 Note Initiated On: 02/12/2016 8:10 AM Scope Withdrawal Time: 0 hours 18 minutes 28 seconds  Total Procedure Duration: 0 hours 23 minutes 33 seconds       [facility]

## 2016-02-15 LAB — SURGICAL PATHOLOGY

## 2016-02-17 ENCOUNTER — Telehealth: Payer: Self-pay

## 2016-02-17 ENCOUNTER — Encounter: Payer: Self-pay | Admitting: General Surgery

## 2016-02-17 DIAGNOSIS — J0100 Acute maxillary sinusitis, unspecified: Secondary | ICD-10-CM | POA: Diagnosis not present

## 2016-02-17 DIAGNOSIS — H6123 Impacted cerumen, bilateral: Secondary | ICD-10-CM | POA: Diagnosis not present

## 2016-02-17 DIAGNOSIS — H9213 Otorrhea, bilateral: Secondary | ICD-10-CM | POA: Diagnosis not present

## 2016-02-17 DIAGNOSIS — H6980 Other specified disorders of Eustachian tube, unspecified ear: Secondary | ICD-10-CM | POA: Diagnosis not present

## 2016-02-17 NOTE — Telephone Encounter (Signed)
-----   Message from Robert Bellow, MD sent at 02/16/2016  8:38 PM EST ----- Please notify the patient that all the colon biopsies were benign, but a follow up in three years should be scheduled as six polyps were removed.  Biopsies of the stomach did not show cancer, but a f/u appt to review is needed.  ----- Message ----- From: Interface, Lab In Three Zero One Sent: 02/15/2016   5:14 PM To: Robert Bellow, MD

## 2016-02-17 NOTE — Telephone Encounter (Signed)
Notified patient as instructed, patient pleased. Discussed follow-up appointments, patient agrees. Patient placed in recalls for repeat colonoscopy in 3 years. He will follow up here in office to discuss endoscopy findings on 02/26/16 at 11:00 am.

## 2016-02-19 ENCOUNTER — Encounter: Payer: Self-pay | Admitting: General Surgery

## 2016-02-19 ENCOUNTER — Ambulatory Visit (INDEPENDENT_AMBULATORY_CARE_PROVIDER_SITE_OTHER): Payer: Medicare Other | Admitting: General Surgery

## 2016-02-19 VITALS — BP 144/78 | HR 76 | Resp 14 | Ht 71.0 in | Wt 329.0 lb

## 2016-02-19 DIAGNOSIS — Z85038 Personal history of other malignant neoplasm of large intestine: Secondary | ICD-10-CM | POA: Diagnosis not present

## 2016-02-19 DIAGNOSIS — K200 Eosinophilic esophagitis: Secondary | ICD-10-CM | POA: Insufficient documentation

## 2016-02-19 MED ORDER — PANTOPRAZOLE SODIUM 20 MG PO TBEC: 20.0000 mg | DELAYED_RELEASE_TABLET | Freq: Every day | ORAL | 4 refills | Status: DC

## 2016-02-19 NOTE — Patient Instructions (Addendum)
Follow up in 2 months. Miralax cap full daily.

## 2016-02-19 NOTE — Progress Notes (Addendum)
Patient ID: Justin Griffith, male   DOB: [birth year], 75 y.o.   MRN: [id number]  Chief Complaint  Patient presents with  . Follow-up    HPI Justin Griffith is a 75 y.o. male here today to discuss his endoscopy and colonoscopy done on 02/12/2016.  HPI  Past Medical History:  Diagnosis Date  . Arthritis   . Asthma 2000  . Back pain   . Colon polyp 2012  . COPD (chronic obstructive pulmonary disease) ([place])   . Diabetes mellitus without complication ([place])   . Hernia 2000  . Hyperlipidemia   . Hypertension   . Personal history of malignant neoplasm of large intestine   . Personal history of tobacco use, presenting hazards to health   . Sleep apnea   . Special screening for malignant neoplasms, colon     Past Surgical History:  Procedure Laterality Date  . CARDIAC CATHETERIZATION Left 09/25/2015   Procedure: Left Heart Cath and Coronary Angiography;  Surgeon: Yolonda Kida, MD;  Location: [place] [facility];  Service: Cardiovascular;  Laterality: Left;  . CHOLECYSTECTOMY  1970  . COLON SURGERY  07-16-99   sigmoid colon resection with primary anastomosis, chemotherapy for metastatic disease  . COLONOSCOPY  2001, 2012   Dr Bary Castilla, tubular adenoma of the cecum and ascending colon in 2012.  Marland Kitchen COLONOSCOPY WITH PROPOFOL N/A 02/12/2016   Procedure: COLONOSCOPY WITH PROPOFOL;  Surgeon: Robert Bellow, MD;  Location[facility];  Service: Endoscopy;  Laterality: N/A;  . ESOPHAGOGASTRODUODENOSCOPY (EGD) WITH PROPOFOL N/A 02/12/2016   Procedure: ESOPHAGOGASTRODUODENOSCOPY (EGD) WITH PROPOFOL;  Surgeon: Robert Bellow, MD;  Location: [facility];  Service: Endoscopy;  Laterality: N/A;  . HERNIA REPAIR Right    right inguinal hernia repair  . JOINT REPLACEMENT Bilateral    knee replacement  . KNEE SURGERY Bilateral 2010  . MYRINGOTOMY WITH TUBE PLACEMENT Bilateral 08/16/2014   Procedure: MYRINGOTOMY WITH TUBE PLACEMENT;  Surgeon: Carloyn Manner, MD;  Location: [facility];   Service: ENT;  Laterality: Bilateral;  . PERIPHERAL VASCULAR CATHETERIZATION Right 09/04/2015   Procedure: Lower Extremity Angiography;  Surgeon: Katha Cabal, MD;  Location: [place] [facility];  Service: Cardiovascular;  Laterality: Right;  . PERIPHERAL VASCULAR CATHETERIZATION  09/04/2015   Procedure: Lower Extremity Intervention;  Surgeon: Katha Cabal, MD;  Location: [place] [facility];  Service: Cardiovascular;;  . TONSILLECTOMY    . TYMPANOSTOMY TUBE PLACEMENT      Family History  Problem Relation Age of Onset  . Diabetes Father   . Cancer Mother     Social History Social History  Substance Use Topics  . Smoking status: Former Smoker    Packs/day: 1.00    Years: 30.00    Types: Cigarettes    Quit date: 03/23/1989  . Smokeless tobacco: Never Used  . Alcohol use No    Allergies  Allergen Reactions  . Farxiga [Dapagliflozin] Rash  . Jardiance [Empagliflozin] Rash    Current Outpatient Prescriptions  Medication Sig Dispense Refill  . aspirin EC 81 MG tablet Take 81 mg by mouth daily.     . carvedilol (COREG) 25 MG tablet Take 1 tablet (25 mg total) by mouth 2 (two) times daily with a meal. 180 tablet 4  . cloNIDine (CATAPRES) 0.1 MG tablet Take 1 tablet (0.1 mg total) by mouth 2 (two) times daily. 180 tablet 4  . Dulaglutide 0.75 MG/0.5ML SOPN Inject into the skin.    . fenofibrate (TRICOR) 145 MG tablet Take 1  tablet (145 mg total) by mouth at bedtime. 90 tablet 4  . HUMALOG KWIKPEN 100 UNIT/ML KiwkPen INJECT 10 UNITS (0.1MLS) SUBCUTANEOUSLY THREE TIMES A DAY 5 pen 12  . Insulin Glargine (TOUJEO SOLOSTAR) 300 UNIT/ML SOPN Inject 40-60 Units into the skin daily. 6 pen 12  . naproxen (NAPROSYN) 500 MG tablet Take 500 mg by mouth 2 (two) times daily with a meal.    . Olmesartan-Amlodipine-HCTZ 40-10-25 MG TABS Take 1 tablet by mouth daily. 90 tablet 4  . rosuvastatin (CRESTOR) 40 MG tablet Take 1 tablet (40 mg total) by mouth at bedtime. 90 tablet 4  .  valACYclovir (VALTREX) 500 MG tablet Take by mouth.    . pantoprazole (PROTONIX) 20 MG tablet Take 1 tablet (20 mg total) by mouth daily. 90 tablet 4   No current facility-administered medications for this visit.     Review of Systems Review of Systems  Constitutional: Negative.   Respiratory: Negative.   Cardiovascular: Negative.     Blood pressure (!) 144/78, pulse 76, resp. rate 14, height 5\' 11"  (1.803 m), weight (!) 329 lb (149.2 kg).  Physical Exam Physical Exam  Data Reviewed SPECIMEN SUBMITTED:  A. Stomach, body; cbx  B. GEJ, nodularity; cbx  C. Esophagus, distal at 44 cm; cbx  D. Colon polyps x 4, cecum and right colon; cbx(1) and hot snare (3)  E. Colon polyp x 2, hepatic flexure; hot snare   CLINICAL HISTORY:    PRE-OPERATIVE DIAGNOSIS:  Reflux, HX colon CA   POST-OPERATIVE DIAGNOSIS:  Gastric polyp; gastritis      DIAGNOSIS:  A. STOMACH, BODY; COLD BIOPSY:  - OXYNTIC MUCOSA WITH CONGESTION.  - NEGATIVE FOR INTESTINAL METAPLASIA, ATROPHY, DYSPLASIA, AND  MALIGNANCY.  - NEGATIVE FOR HELICOBACTER PYLORI IN HEMATOXYLIN AND EOSIN SECTIONS.   B. GASTROESOPHAGEAL JUNCTION, NODULARITY; COLD BIOPSY:  - SQUAMOCOLUMNAR MUCOSA WITH SEVERE CHRONIC ACTIVE INFLAMMATION AND  REACTIVE FOVEOLAR HYPERPLASIA, SEE COMMENT.  - MIXED SUBEPITHELIAL INFLAMMATION, WITH NEUTROPHILS, EOSINOPHILS, AND  PLASMA CELLS.  - NEGATIVE FOR GOBLET CELLS, DYSPLASIA, AND MALIGNANCY.   C. ESOPHAGUS, DISTAL AT 44 CM; COLD BIOPSY:  - SQUAMOUS MUCOSA WITH ACTIVE ESOPHAGITIS, SEE COMMENT.  - INTRAEPITHELIAL EOSINOPHILS, UP TO 35 PER HIGH POWER FIELD, AND RARE  INTRAEPITHELIAL NEUTROPHILS.  - SUBEPITHELIAL INFLAMMATION AND FIBROSIS.  - NEGATIVE FOR DYSPLASIA AND MALIGNANCY.   Comment:  In B and C there is active esophagitis. In B the features are consistent  with reflux esophagitis, but in C there is predominantly eosinophilic  inflammation raising concern for eosinophilic esophagitis.  Correlation  with the history, endoscopic appearance, and response to PPI therapy is  recommended.   D. COLON POLYPS X 4, CECUM AND RIGHT COLON; COLD BIOPSY (1) AND HOT  SNARE (3):  - TUBULAR ADENOMAS: MULTIPLE (>12) FRAGMENTS.  - NEGATIVE FOR HIGH-GRADE DYSPLASIA AND MALIGNANCY.   E. COLON POLYP X 2, HEPATIC FLEXURE; HOT SNARE:  - TUBULAR ADENOMAS: ONE 7 MM FRAGMENT, FOUR 4-5 MM FRAGMENTS, AND  SEVERAL SMALLER FRAGMENTS.  - NEGATIVE FOR HIGH-GRADE DYSPLASIA AND MALIGNANCY.    Assessment    Multiple colonic polyps warranting follow-up colonoscopy in 3 years.  Inflammatory polyp at the GE junction and evidence of extensive eosinophilia infiltration in the distal esophagus which appeared normal to visual inspection. Consistent with eosinophilic esophagitis.  Plan    We'll place the patient on Protonix 40 mg daily. Reassess his symptoms in 2 months.   We will plan for a follow-up colonoscopy in 3 years.    Return  in two months This information has been scribed by Gaspar Cola CMA.    Robert Bellow 03/26/2016, 3:03 PM

## 2016-02-26 ENCOUNTER — Ambulatory Visit: Payer: Medicare Other | Admitting: General Surgery

## 2016-03-02 DIAGNOSIS — H6123 Impacted cerumen, bilateral: Secondary | ICD-10-CM | POA: Diagnosis not present

## 2016-03-02 DIAGNOSIS — H9213 Otorrhea, bilateral: Secondary | ICD-10-CM | POA: Diagnosis not present

## 2016-03-02 DIAGNOSIS — H6980 Other specified disorders of Eustachian tube, unspecified ear: Secondary | ICD-10-CM | POA: Diagnosis not present

## 2016-03-02 DIAGNOSIS — J0100 Acute maxillary sinusitis, unspecified: Secondary | ICD-10-CM | POA: Diagnosis not present

## 2016-03-05 DIAGNOSIS — H35363 Drusen (degenerative) of macula, bilateral: Secondary | ICD-10-CM | POA: Diagnosis not present

## 2016-03-05 DIAGNOSIS — H0289 Other specified disorders of eyelid: Secondary | ICD-10-CM | POA: Diagnosis not present

## 2016-03-05 DIAGNOSIS — E119 Type 2 diabetes mellitus without complications: Secondary | ICD-10-CM | POA: Diagnosis not present

## 2016-03-05 DIAGNOSIS — H26493 Other secondary cataract, bilateral: Secondary | ICD-10-CM | POA: Diagnosis not present

## 2016-03-05 LAB — HM DIABETES EYE EXAM

## 2016-03-12 ENCOUNTER — Encounter: Payer: Self-pay | Admitting: General Surgery

## 2016-03-26 DIAGNOSIS — H6983 Other specified disorders of Eustachian tube, bilateral: Secondary | ICD-10-CM | POA: Diagnosis not present

## 2016-03-26 DIAGNOSIS — H60392 Other infective otitis externa, left ear: Secondary | ICD-10-CM | POA: Diagnosis not present

## 2016-04-06 DIAGNOSIS — H9213 Otorrhea, bilateral: Secondary | ICD-10-CM | POA: Diagnosis not present

## 2016-04-17 DIAGNOSIS — M79674 Pain in right toe(s): Secondary | ICD-10-CM | POA: Diagnosis not present

## 2016-04-17 DIAGNOSIS — B351 Tinea unguium: Secondary | ICD-10-CM | POA: Diagnosis not present

## 2016-04-17 DIAGNOSIS — M79675 Pain in left toe(s): Secondary | ICD-10-CM | POA: Diagnosis not present

## 2016-04-20 DIAGNOSIS — H60391 Other infective otitis externa, right ear: Secondary | ICD-10-CM | POA: Diagnosis not present

## 2016-04-20 DIAGNOSIS — H9211 Otorrhea, right ear: Secondary | ICD-10-CM | POA: Diagnosis not present

## 2016-04-21 ENCOUNTER — Ambulatory Visit: Payer: Medicare Other | Admitting: General Surgery

## 2016-05-01 DIAGNOSIS — H6981 Other specified disorders of Eustachian tube, right ear: Secondary | ICD-10-CM | POA: Diagnosis not present

## 2016-05-01 DIAGNOSIS — H906 Mixed conductive and sensorineural hearing loss, bilateral: Secondary | ICD-10-CM | POA: Diagnosis not present

## 2016-05-01 DIAGNOSIS — H9211 Otorrhea, right ear: Secondary | ICD-10-CM | POA: Diagnosis not present

## 2016-05-04 DIAGNOSIS — H9213 Otorrhea, bilateral: Secondary | ICD-10-CM | POA: Diagnosis not present

## 2016-05-08 ENCOUNTER — Other Ambulatory Visit: Payer: Self-pay | Admitting: Family Medicine

## 2016-05-08 DIAGNOSIS — H9210 Otorrhea, unspecified ear: Secondary | ICD-10-CM | POA: Diagnosis not present

## 2016-05-11 DIAGNOSIS — H9210 Otorrhea, unspecified ear: Secondary | ICD-10-CM | POA: Diagnosis not present

## 2016-05-11 NOTE — Telephone Encounter (Signed)
  Last routine OV: 01/30/16 Next OV: 07/23/16   MEDICATION NOT ON CURRENT MED LIST!!!

## 2016-05-18 ENCOUNTER — Other Ambulatory Visit: Payer: Self-pay | Admitting: Otolaryngology

## 2016-05-18 DIAGNOSIS — H6123 Impacted cerumen, bilateral: Secondary | ICD-10-CM | POA: Diagnosis not present

## 2016-05-18 DIAGNOSIS — H60393 Other infective otitis externa, bilateral: Secondary | ICD-10-CM | POA: Diagnosis not present

## 2016-05-18 DIAGNOSIS — H6983 Other specified disorders of Eustachian tube, bilateral: Secondary | ICD-10-CM | POA: Diagnosis not present

## 2016-05-18 DIAGNOSIS — H9213 Otorrhea, bilateral: Secondary | ICD-10-CM

## 2016-05-26 ENCOUNTER — Ambulatory Visit
Admission: RE | Admit: 2016-05-26 | Discharge: 2016-05-26 | Disposition: A | Discharge status: Home / Self Care | Payer: Medicare Other | Source: Ambulatory Visit | Attending: Otolaryngology | Admitting: Otolaryngology

## 2016-05-26 DIAGNOSIS — H7013 Chronic mastoiditis, bilateral: Secondary | ICD-10-CM | POA: Diagnosis not present

## 2016-05-26 DIAGNOSIS — H9213 Otorrhea, bilateral: Secondary | ICD-10-CM

## 2016-05-26 DIAGNOSIS — H7191 Unspecified cholesteatoma, right ear: Secondary | ICD-10-CM | POA: Diagnosis not present

## 2016-05-26 DIAGNOSIS — H7011 Chronic mastoiditis, right ear: Secondary | ICD-10-CM | POA: Diagnosis not present

## 2016-05-27 DIAGNOSIS — H7103 Cholesteatoma of attic, bilateral: Secondary | ICD-10-CM | POA: Diagnosis not present

## 2016-05-27 DIAGNOSIS — H60393 Other infective otitis externa, bilateral: Secondary | ICD-10-CM | POA: Diagnosis not present

## 2016-06-17 DIAGNOSIS — Z794 Long term (current) use of insulin: Secondary | ICD-10-CM | POA: Diagnosis not present

## 2016-06-17 DIAGNOSIS — E1165 Type 2 diabetes mellitus with hyperglycemia: Secondary | ICD-10-CM | POA: Diagnosis not present

## 2016-06-26 ENCOUNTER — Other Ambulatory Visit: Payer: Self-pay

## 2016-06-26 MED ORDER — "INSULIN SYRINGE-NEEDLE U-100 25G X 5/8"" 1 ML MISC": 0 refills | Status: DC

## 2016-06-26 NOTE — Telephone Encounter (Signed)
Pt came in for Humalog samples. We only had vials, not prefilled pens. Pt was given samples and RX for needles/syringes sent to [facility].

## 2016-07-01 DIAGNOSIS — H7103 Cholesteatoma of attic, bilateral: Secondary | ICD-10-CM | POA: Diagnosis not present

## 2016-07-01 DIAGNOSIS — H60391 Other infective otitis externa, right ear: Secondary | ICD-10-CM | POA: Diagnosis not present

## 2016-07-02 ENCOUNTER — Encounter: Payer: Self-pay | Admitting: *Deleted

## 2016-07-06 ENCOUNTER — Encounter (INDEPENDENT_AMBULATORY_CARE_PROVIDER_SITE_OTHER): Payer: Medicare Other

## 2016-07-06 ENCOUNTER — Ambulatory Visit (INDEPENDENT_AMBULATORY_CARE_PROVIDER_SITE_OTHER): Payer: Medicare Other | Admitting: Vascular Surgery

## 2016-07-06 ENCOUNTER — Encounter (INDEPENDENT_AMBULATORY_CARE_PROVIDER_SITE_OTHER): Payer: Self-pay

## 2016-07-06 ENCOUNTER — Ambulatory Visit (INDEPENDENT_AMBULATORY_CARE_PROVIDER_SITE_OTHER): Payer: Medicare Other

## 2016-07-06 DIAGNOSIS — I739 Peripheral vascular disease, unspecified: Secondary | ICD-10-CM | POA: Diagnosis not present

## 2016-07-08 DIAGNOSIS — H60393 Other infective otitis externa, bilateral: Secondary | ICD-10-CM | POA: Diagnosis not present

## 2016-07-08 DIAGNOSIS — H7103 Cholesteatoma of attic, bilateral: Secondary | ICD-10-CM | POA: Diagnosis not present

## 2016-07-15 DIAGNOSIS — E1165 Type 2 diabetes mellitus with hyperglycemia: Secondary | ICD-10-CM | POA: Diagnosis not present

## 2016-07-15 DIAGNOSIS — H7103 Cholesteatoma of attic, bilateral: Secondary | ICD-10-CM | POA: Diagnosis not present

## 2016-07-15 DIAGNOSIS — H60393 Other infective otitis externa, bilateral: Secondary | ICD-10-CM | POA: Diagnosis not present

## 2016-07-15 DIAGNOSIS — Z794 Long term (current) use of insulin: Secondary | ICD-10-CM | POA: Diagnosis not present

## 2016-07-22 DIAGNOSIS — Z79899 Other long term (current) drug therapy: Secondary | ICD-10-CM | POA: Diagnosis not present

## 2016-07-22 DIAGNOSIS — E1165 Type 2 diabetes mellitus with hyperglycemia: Secondary | ICD-10-CM | POA: Diagnosis not present

## 2016-07-22 DIAGNOSIS — Z794 Long term (current) use of insulin: Secondary | ICD-10-CM | POA: Diagnosis not present

## 2016-07-22 DIAGNOSIS — E781 Pure hyperglyceridemia: Secondary | ICD-10-CM | POA: Diagnosis not present

## 2016-07-22 DIAGNOSIS — I10 Essential (primary) hypertension: Secondary | ICD-10-CM | POA: Diagnosis not present

## 2016-07-23 ENCOUNTER — Encounter: Payer: Self-pay | Admitting: Family Medicine

## 2016-07-23 ENCOUNTER — Ambulatory Visit (INDEPENDENT_AMBULATORY_CARE_PROVIDER_SITE_OTHER): Payer: Medicare Other | Admitting: Family Medicine

## 2016-07-23 VITALS — BP 125/72 | HR 81 | Temp 97.6°F | Ht 71.0 in | Wt 309.8 lb

## 2016-07-23 DIAGNOSIS — M5441 Lumbago with sciatica, right side: Secondary | ICD-10-CM

## 2016-07-23 DIAGNOSIS — E119 Type 2 diabetes mellitus without complications: Secondary | ICD-10-CM | POA: Diagnosis not present

## 2016-07-23 DIAGNOSIS — N400 Enlarged prostate without lower urinary tract symptoms: Secondary | ICD-10-CM

## 2016-07-23 DIAGNOSIS — J019 Acute sinusitis, unspecified: Secondary | ICD-10-CM | POA: Diagnosis not present

## 2016-07-23 DIAGNOSIS — N401 Enlarged prostate with lower urinary tract symptoms: Secondary | ICD-10-CM | POA: Insufficient documentation

## 2016-07-23 DIAGNOSIS — Z1329 Encounter for screening for other suspected endocrine disorder: Secondary | ICD-10-CM

## 2016-07-23 DIAGNOSIS — I10 Essential (primary) hypertension: Secondary | ICD-10-CM | POA: Diagnosis not present

## 2016-07-23 DIAGNOSIS — Z6841 Body Mass Index (BMI) 40.0 and over, adult: Secondary | ICD-10-CM

## 2016-07-23 DIAGNOSIS — Z125 Encounter for screening for malignant neoplasm of prostate: Secondary | ICD-10-CM

## 2016-07-23 DIAGNOSIS — Z13220 Encounter for screening for lipoid disorders: Secondary | ICD-10-CM

## 2016-07-23 DIAGNOSIS — E785 Hyperlipidemia, unspecified: Secondary | ICD-10-CM

## 2016-07-23 DIAGNOSIS — N138 Other obstructive and reflux uropathy: Secondary | ICD-10-CM | POA: Insufficient documentation

## 2016-07-23 DIAGNOSIS — Z7189 Other specified counseling: Secondary | ICD-10-CM | POA: Diagnosis not present

## 2016-07-23 DIAGNOSIS — Z0000 Encounter for general adult medical examination without abnormal findings: Secondary | ICD-10-CM

## 2016-07-23 DIAGNOSIS — G8929 Other chronic pain: Secondary | ICD-10-CM

## 2016-07-23 MED ORDER — CLONIDINE HCL 0.1 MG PO TABS: 0.1000 mg | ORAL_TABLET | Freq: Two times a day (BID) | ORAL | 4 refills | Status: DC

## 2016-07-23 MED ORDER — ROSUVASTATIN CALCIUM 40 MG PO TABS: 40.0000 mg | ORAL_TABLET | Freq: Every day | ORAL | 4 refills | Status: DC

## 2016-07-23 MED ORDER — OLMESARTAN-AMLODIPINE-HCTZ 40-10-25 MG PO TABS: 1.0000 | ORAL_TABLET | Freq: Every day | ORAL | 4 refills | Status: DC

## 2016-07-23 MED ORDER — FENOFIBRATE 145 MG PO TABS: 145.0000 mg | ORAL_TABLET | Freq: Every day | ORAL | 4 refills | Status: DC

## 2016-07-23 MED ORDER — AZITHROMYCIN 250 MG PO TABS: ORAL_TABLET | ORAL | 0 refills | Status: DC

## 2016-07-23 MED ORDER — CARVEDILOL 25 MG PO TABS: 25.0000 mg | ORAL_TABLET | Freq: Two times a day (BID) | ORAL | 4 refills | Status: DC

## 2016-07-23 NOTE — Assessment & Plan Note (Signed)
Will add fish oil

## 2016-07-23 NOTE — Assessment & Plan Note (Signed)
The current medical regimen is effective;  continue present plan and medications.  

## 2016-07-23 NOTE — Progress Notes (Signed)
BP 125/72   Pulse 81   Temp 97.6 F (36.4 C) (Oral)   Ht 5\' 11"  (1.803 m)   Wt (!) 309 lb 12.8 oz (140.5 kg)   SpO2 97%   BMI 43.21 kg/m    Subjective:    Patient ID: Justin Griffith, male    DOB: 09-06-1940, 76 y.o.   MRN: [id number]  HPI: CHRISTOPHER GLASSCOCK is a 76 y.o. male  Chief Complaint  Patient presents with  . Annual Exam  . Nasal Congestion  Patient primary concern is a weeks worth of cold congestion and facial pain and pressure cough productive no real low-grade fever but just getting worse not getting better. Diabetes being followed by endocrine and had blood work done last week which was reviewed and essentially stable. We will order rest of bilirubin and laboratory work. Blood pressure doing well Triglycerides still markedly elevated patient taking triglycerides and diabetes medicines but with elevated sugar To control triglycerides patient not taking Fish oil yet.  Relevant past medical, surgical, family and social history reviewed and updated as indicated. Interim medical history since our last visit reviewed. Allergies and medications reviewed and updated.  Review of Systems  Constitutional: Negative.   HENT: Negative.   Eyes: Negative.   Respiratory: Negative.   Cardiovascular: Negative.   Gastrointestinal: Negative.   Endocrine: Negative.   Genitourinary: Negative.   Musculoskeletal: Negative.   Skin: Negative.   Allergic/Immunologic: Negative.   Neurological: Negative.   Hematological: Negative.   Psychiatric/Behavioral: Negative.     Per HPI unless specifically indicated above     Objective:    BP 125/72   Pulse 81   Temp 97.6 F (36.4 C) (Oral)   Ht 5\' 11"  (1.803 m)   Wt (!) 309 lb 12.8 oz (140.5 kg)   SpO2 97%   BMI 43.21 kg/m   Wt Readings from Last 3 Encounters:  07/23/16 (!) 309 lb 12.8 oz (140.5 kg)  02/19/16 (!) 329 lb (149.2 kg)  02/12/16 (!) 310 lb (140.6 kg)    Physical Exam  Constitutional: He is oriented to person, place,  and time. He appears well-developed and well-nourished.  HENT:  Head: Normocephalic and atraumatic.  Right Ear: External ear normal.  Left Ear: External ear normal.  Eyes: Conjunctivae and EOM are normal. Pupils are equal, round, and reactive to light.  Neck: Normal range of motion. Neck supple.  Cardiovascular: Normal rate, regular rhythm, normal heart sounds and intact distal pulses.   Pulmonary/Chest: Effort normal and breath sounds normal.  Abdominal: Soft. Bowel sounds are normal. There is no splenomegaly or hepatomegaly.  Genitourinary: Rectum normal, prostate normal and penis normal.  Musculoskeletal: Normal range of motion.  Neurological: He is alert and oriented to person, place, and time. He has normal reflexes.  Skin: No rash noted. No erythema.  Psychiatric: He has a normal mood and affect. His behavior is normal. Judgment and thought content normal.    Results for orders placed or performed in visit on 03/09/16  HM DIABETES EYE EXAM  Result Value Ref Range   HM Diabetic Eye Exam No Retinopathy No Retinopathy      Assessment & Plan:   Problem List Items Addressed This Visit      Cardiovascular and Mediastinum   Hypertension    The current medical regimen is effective;  continue present plan and medications.       Relevant Medications   carvedilol (COREG) 25 MG tablet   cloNIDine (CATAPRES) 0.1 MG tablet  rosuvastatin (CRESTOR) 40 MG tablet   Olmesartan-Amlodipine-HCTZ 40-10-25 MG TABS   fenofibrate (TRICOR) 145 MG tablet   Other Relevant Orders   Comprehensive metabolic panel   TSH     Respiratory   Acute sinusitis    Discuss treat with z pak      Relevant Medications   azithromycin (ZITHROMAX) 250 MG tablet     Endocrine   Diabetes mellitus without complication (HCC)    Followed by endocrine      Relevant Medications   rosuvastatin (CRESTOR) 40 MG tablet   Olmesartan-Amlodipine-HCTZ 40-10-25 MG TABS   Other Relevant Orders   Comprehensive  metabolic panel   TSH     Genitourinary   BPH (benign prostatic hyperplasia)   Relevant Orders   PSA     Other   Back pain    DJD wt loss      Hyperlipidemia    Will add fish oil      Relevant Medications   carvedilol (COREG) 25 MG tablet   cloNIDine (CATAPRES) 0.1 MG tablet   rosuvastatin (CRESTOR) 40 MG tablet   Olmesartan-Amlodipine-HCTZ 40-10-25 MG TABS   fenofibrate (TRICOR) 145 MG tablet   Other Relevant Orders   Comprehensive metabolic panel   TSH   BMI 40.0-44.9, adult (HCC)    Diet wt loss      Advanced care planning/counseling discussion    A voluntary discussion about advance care planning including the explanation and discussion of advance directives was extensively discussed  with the patient.  Explanation about the health care proxy and Living will was reviewed and packet with forms with explanation of how to fill them out was given.    Patient was offered a separate [facility] visit for further assistance with forms.          Other Visit Diagnoses    Annual physical exam    -  Primary   Relevant Orders   Comprehensive metabolic panel   PSA   TSH   Screening cholesterol level       Prostate cancer screening       Thyroid disorder screen       Relevant Orders   TSH       Follow up plan: Return in about 6 months (around 01/23/2017) for BMP,  Lipids, ALT, AST.

## 2016-07-23 NOTE — Assessment & Plan Note (Signed)
A voluntary discussion about advance care planning including the explanation and discussion of advance directives was extensively discussed  with the patient.  Explanation about the health care proxy and Living will was reviewed and packet with forms with explanation of how to fill them out was given.  .  Patient was offered a separate Advance Care Planning visit for further assistance with forms.    

## 2016-07-23 NOTE — Assessment & Plan Note (Signed)
Diet wt loss

## 2016-07-23 NOTE — Assessment & Plan Note (Signed)
Discuss treat with z pak

## 2016-07-23 NOTE — Assessment & Plan Note (Signed)
DJD wt loss

## 2016-07-23 NOTE — Assessment & Plan Note (Signed)
Followed by endocrine

## 2016-07-23 NOTE — Addendum Note (Signed)
Addended byGolden Pop on: 07/23/2016 08:57 AM   Modules accepted: Orders

## 2016-07-24 LAB — COMPREHENSIVE METABOLIC PANEL
ALT: 30 IU/L (ref 0–44)
AST: 17 IU/L (ref 0–40)
Albumin/Globulin Ratio: 1.6 (ref 1.2–2.2)
Albumin: 4.2 g/dL (ref 3.5–4.8)
Alkaline Phosphatase: 48 IU/L (ref 39–117)
BUN/Creatinine Ratio: 18 (ref 10–24)
BUN: 26 mg/dL (ref 8–27)
Bilirubin Total: 0.3 mg/dL (ref 0.0–1.2)
CO2: 23 mmol/L (ref 18–29)
Calcium: 10.7 mg/dL — ABNORMAL HIGH (ref 8.6–10.2)
Chloride: 98 mmol/L (ref 96–106)
Creatinine, Ser: 1.44 mg/dL — ABNORMAL HIGH (ref 0.76–1.27)
GFR calc Af Amer: 54 mL/min/{1.73_m2} — ABNORMAL LOW (ref >59)
GFR calc non Af Amer: 47 mL/min/{1.73_m2} — ABNORMAL LOW (ref >59)
Globulin, Total: 2.7 g/dL (ref 1.5–4.5)
Glucose: 187 mg/dL — ABNORMAL HIGH (ref 65–99)
Potassium: 4.3 mmol/L (ref 3.5–5.2)
Sodium: 137 mmol/L (ref 134–144)
Total Protein: 6.9 g/dL (ref 6.0–8.5)

## 2016-07-24 LAB — PSA: Prostate Specific Ag, Serum: 2.2 ng/mL (ref 0.0–4.0)

## 2016-07-24 LAB — TSH: TSH: 2.75 u[IU]/mL (ref 0.450–4.500)

## 2016-07-27 ENCOUNTER — Telehealth: Payer: Self-pay | Admitting: Family Medicine

## 2016-07-27 DIAGNOSIS — N183 Chronic kidney disease, stage 3 unspecified: Secondary | ICD-10-CM

## 2016-07-27 NOTE — Telephone Encounter (Signed)
Phone call Discussed with patient declining renal function. Discussed importance of blood pressure diabetes control and weight loss. Patient also taking aspirin no other nonsteroidal anti-inflammatory agents will stop aspirin recheck BMP next office visit.

## 2016-07-29 DIAGNOSIS — H6982 Other specified disorders of Eustachian tube, left ear: Secondary | ICD-10-CM | POA: Diagnosis not present

## 2016-07-29 DIAGNOSIS — H7103 Cholesteatoma of attic, bilateral: Secondary | ICD-10-CM | POA: Diagnosis not present

## 2016-07-29 DIAGNOSIS — H60393 Other infective otitis externa, bilateral: Secondary | ICD-10-CM | POA: Diagnosis not present

## 2016-08-03 DIAGNOSIS — H60399 Other infective otitis externa, unspecified ear: Secondary | ICD-10-CM | POA: Diagnosis not present

## 2016-08-03 DIAGNOSIS — H906 Mixed conductive and sensorineural hearing loss, bilateral: Secondary | ICD-10-CM | POA: Diagnosis not present

## 2016-08-18 DIAGNOSIS — H60399 Other infective otitis externa, unspecified ear: Secondary | ICD-10-CM | POA: Diagnosis not present

## 2016-08-18 DIAGNOSIS — H7103 Cholesteatoma of attic, bilateral: Secondary | ICD-10-CM | POA: Diagnosis not present

## 2016-08-24 DIAGNOSIS — H7103 Cholesteatoma of attic, bilateral: Secondary | ICD-10-CM | POA: Diagnosis not present

## 2016-08-24 DIAGNOSIS — H60393 Other infective otitis externa, bilateral: Secondary | ICD-10-CM | POA: Diagnosis not present

## 2016-08-31 ENCOUNTER — Ambulatory Visit (INDEPENDENT_AMBULATORY_CARE_PROVIDER_SITE_OTHER): Payer: Medicare Other | Admitting: Family Medicine

## 2016-08-31 VITALS — BP 167/83 | HR 70 | Wt 306.0 lb

## 2016-08-31 DIAGNOSIS — I10 Essential (primary) hypertension: Secondary | ICD-10-CM

## 2016-08-31 DIAGNOSIS — R109 Unspecified abdominal pain: Secondary | ICD-10-CM

## 2016-08-31 DIAGNOSIS — E119 Type 2 diabetes mellitus without complications: Secondary | ICD-10-CM

## 2016-08-31 DIAGNOSIS — R8290 Unspecified abnormal findings in urine: Secondary | ICD-10-CM

## 2016-08-31 LAB — MICROSCOPIC EXAMINATION: Bacteria, UA: NONE SEEN

## 2016-08-31 LAB — URINALYSIS, ROUTINE W REFLEX MICROSCOPIC
Bilirubin, UA: NEGATIVE
Glucose, UA: NEGATIVE
Ketones, UA: NEGATIVE
Leukocytes, UA: NEGATIVE
Nitrite, UA: NEGATIVE
Protein, UA: NEGATIVE
RBC, UA: NEGATIVE
Specific Gravity, UA: 1.01 (ref 1.005–1.030)
Urobilinogen, Ur: 0.2 mg/dL (ref 0.2–1.0)
pH, UA: 5.5 (ref 5.0–7.5)

## 2016-08-31 NOTE — Assessment & Plan Note (Signed)
Discussed diabetes care with gastroenteritis

## 2016-08-31 NOTE — Progress Notes (Signed)
BP (!) 167/83   Pulse 70   Wt (!) 306 lb (138.8 kg)   SpO2 96%   BMI 42.68 kg/m    Subjective:    Patient ID: Justin Griffith, male    DOB: 1940/10/19, 76 y.o.   MRN: [id number]  HPI: Justin Griffith is a 76 y.o. male  Concerned about kidneys Having some dysuria no frequency urgency no noticed blood. Patient also has not taking blood pressure medicine and blood pressures markedly elevated but had stomach bug last night and has thrown up and had diarrhea could not keep medication down this morning. Stomach is settled down and is feeling better. Reviewed diabetes care and treatment with nausea vomiting patient will adjust appropriately.  Relevant past medical, surgical, family and social history reviewed and updated as indicated. Interim medical history since our last visit reviewed. Allergies and medications reviewed and updated.  Review of Systems  Constitutional: Negative.   Respiratory: Negative.   Cardiovascular: Negative.     Per HPI unless specifically indicated above     Objective:    BP (!) 167/83   Pulse 70   Wt (!) 306 lb (138.8 kg)   SpO2 96%   BMI 42.68 kg/m   Wt Readings from Last 3 Encounters:  08/31/16 (!) 306 lb (138.8 kg)  07/23/16 (!) 309 lb 12.8 oz (140.5 kg)  02/19/16 (!) 329 lb (149.2 kg)    Physical Exam  Constitutional: He is oriented to person, place, and time. He appears well-developed and well-nourished.  HENT:  Head: Normocephalic and atraumatic.  Eyes: Conjunctivae and EOM are normal.  Neck: Normal range of motion.  Cardiovascular: Normal rate, regular rhythm and normal heart sounds.   Pulmonary/Chest: Effort normal and breath sounds normal.  Musculoskeletal: Normal range of motion.  Neurological: He is alert and oriented to person, place, and time.  Skin: No erythema.  Psychiatric: He has a normal mood and affect. His behavior is normal. Judgment and thought content normal.    Results for orders placed or performed in visit on  08/31/16  Microscopic Examination  Result Value Ref Range   WBC, UA 0-5 0 - 5 /hpf   RBC, UA 0-2 0 - 2 /hpf   Epithelial Cells (non renal) CANCELED    Bacteria, UA None seen None seen/Few  Urinalysis, Routine w reflex microscopic  Result Value Ref Range   Specific Gravity, UA 1.010 1.005 - 1.030   pH, UA 5.5 5.0 - 7.5   Color, UA Yellow Yellow   Appearance Ur Clear Clear   Leukocytes, UA Negative Negative   Protein, UA Negative Negative/Trace   Glucose, UA Negative Negative   Ketones, UA Negative Negative   RBC, UA Negative Negative   Bilirubin, UA Negative Negative   Urobilinogen, Ur 0.2 0.2 - 1.0 mg/dL   Nitrite, UA Negative Negative   Microscopic Examination See below:       Assessment & Plan:   Problem List Items Addressed This Visit      Cardiovascular and Mediastinum   Hypertension    Poor control but not taking medications today patient will get back on medication his stomach is settled down.        Endocrine   Diabetes mellitus without complication ([place])    Discussed diabetes care with gastroenteritis       Other Visit Diagnoses    Abnormal urine odor    -  Primary   Relevant Orders   Urinalysis, Routine w reflex microscopic (Completed)  Flank pain       Relevant Orders   Urinalysis, Routine w reflex microscopic (Completed)     Urinary symptoms with no UTI discuss observing fluids etc.  Follow up plan: Return for As scheduled.

## 2016-08-31 NOTE — Assessment & Plan Note (Signed)
Poor control but not taking medications today patient will get back on medication his stomach is settled down.

## 2016-09-07 DIAGNOSIS — H60393 Other infective otitis externa, bilateral: Secondary | ICD-10-CM | POA: Diagnosis not present

## 2016-09-07 DIAGNOSIS — H7103 Cholesteatoma of attic, bilateral: Secondary | ICD-10-CM | POA: Diagnosis not present

## 2016-09-21 DIAGNOSIS — H6983 Other specified disorders of Eustachian tube, bilateral: Secondary | ICD-10-CM | POA: Diagnosis not present

## 2016-09-21 DIAGNOSIS — J340 Abscess, furuncle and carbuncle of nose: Secondary | ICD-10-CM | POA: Diagnosis not present

## 2016-10-01 DIAGNOSIS — H6122 Impacted cerumen, left ear: Secondary | ICD-10-CM | POA: Diagnosis not present

## 2016-10-01 DIAGNOSIS — H6980 Other specified disorders of Eustachian tube, unspecified ear: Secondary | ICD-10-CM | POA: Diagnosis not present

## 2016-10-01 DIAGNOSIS — H9211 Otorrhea, right ear: Secondary | ICD-10-CM | POA: Diagnosis not present

## 2016-10-05 DIAGNOSIS — H9211 Otorrhea, right ear: Secondary | ICD-10-CM | POA: Diagnosis not present

## 2016-10-12 DIAGNOSIS — H6121 Impacted cerumen, right ear: Secondary | ICD-10-CM | POA: Diagnosis not present

## 2016-10-14 DIAGNOSIS — Z87891 Personal history of nicotine dependence: Secondary | ICD-10-CM | POA: Diagnosis not present

## 2016-10-14 DIAGNOSIS — E669 Obesity, unspecified: Secondary | ICD-10-CM | POA: Diagnosis not present

## 2016-10-14 DIAGNOSIS — I10 Essential (primary) hypertension: Secondary | ICD-10-CM | POA: Diagnosis not present

## 2016-10-14 DIAGNOSIS — G4733 Obstructive sleep apnea (adult) (pediatric): Secondary | ICD-10-CM | POA: Diagnosis not present

## 2016-10-14 DIAGNOSIS — E782 Mixed hyperlipidemia: Secondary | ICD-10-CM | POA: Diagnosis not present

## 2016-10-14 DIAGNOSIS — I209 Angina pectoris, unspecified: Secondary | ICD-10-CM | POA: Diagnosis not present

## 2016-10-14 DIAGNOSIS — R0602 Shortness of breath: Secondary | ICD-10-CM | POA: Diagnosis not present

## 2016-10-14 DIAGNOSIS — Z6841 Body Mass Index (BMI) 40.0 and over, adult: Secondary | ICD-10-CM | POA: Diagnosis not present

## 2016-10-14 DIAGNOSIS — I739 Peripheral vascular disease, unspecified: Secondary | ICD-10-CM | POA: Diagnosis not present

## 2016-11-02 DIAGNOSIS — H6982 Other specified disorders of Eustachian tube, left ear: Secondary | ICD-10-CM | POA: Diagnosis not present

## 2016-11-02 DIAGNOSIS — H7121 Cholesteatoma of mastoid, right ear: Secondary | ICD-10-CM | POA: Diagnosis not present

## 2016-11-25 DIAGNOSIS — H7121 Cholesteatoma of mastoid, right ear: Secondary | ICD-10-CM | POA: Diagnosis not present

## 2016-11-25 DIAGNOSIS — H6982 Other specified disorders of Eustachian tube, left ear: Secondary | ICD-10-CM | POA: Diagnosis not present

## 2016-12-16 DIAGNOSIS — E1165 Type 2 diabetes mellitus with hyperglycemia: Secondary | ICD-10-CM | POA: Diagnosis not present

## 2016-12-16 DIAGNOSIS — Z794 Long term (current) use of insulin: Secondary | ICD-10-CM | POA: Diagnosis not present

## 2016-12-23 DIAGNOSIS — E1129 Type 2 diabetes mellitus with other diabetic kidney complication: Secondary | ICD-10-CM | POA: Diagnosis not present

## 2016-12-23 DIAGNOSIS — E1169 Type 2 diabetes mellitus with other specified complication: Secondary | ICD-10-CM | POA: Diagnosis not present

## 2016-12-23 DIAGNOSIS — I10 Essential (primary) hypertension: Secondary | ICD-10-CM | POA: Diagnosis not present

## 2016-12-23 DIAGNOSIS — E785 Hyperlipidemia, unspecified: Secondary | ICD-10-CM | POA: Diagnosis not present

## 2016-12-23 DIAGNOSIS — R809 Proteinuria, unspecified: Secondary | ICD-10-CM | POA: Diagnosis not present

## 2016-12-23 DIAGNOSIS — E1159 Type 2 diabetes mellitus with other circulatory complications: Secondary | ICD-10-CM | POA: Diagnosis not present

## 2016-12-23 DIAGNOSIS — Z794 Long term (current) use of insulin: Secondary | ICD-10-CM | POA: Diagnosis not present

## 2016-12-28 DIAGNOSIS — H60393 Other infective otitis externa, bilateral: Secondary | ICD-10-CM | POA: Diagnosis not present

## 2016-12-28 DIAGNOSIS — H6123 Impacted cerumen, bilateral: Secondary | ICD-10-CM | POA: Diagnosis not present

## 2016-12-28 DIAGNOSIS — H908 Mixed conductive and sensorineural hearing loss, unspecified: Secondary | ICD-10-CM | POA: Diagnosis not present

## 2017-01-07 DIAGNOSIS — H60392 Other infective otitis externa, left ear: Secondary | ICD-10-CM | POA: Diagnosis not present

## 2017-01-07 DIAGNOSIS — H7121 Cholesteatoma of mastoid, right ear: Secondary | ICD-10-CM | POA: Diagnosis not present

## 2017-01-07 DIAGNOSIS — H6123 Impacted cerumen, bilateral: Secondary | ICD-10-CM | POA: Diagnosis not present

## 2017-01-12 ENCOUNTER — Other Ambulatory Visit: Payer: Self-pay | Admitting: Family Medicine

## 2017-01-12 NOTE — Telephone Encounter (Signed)
Patient stopped by the office this afternoon requesting refill on his lotrisone be sent to [facility] today if at all possible.  I explained that Dr Jeananne Rama was not in this afternoon and I would send the message to see if anyone could refill but I also informed him of the 43-83 hr refill policy.    Thanks

## 2017-01-13 MED ORDER — CLOTRIMAZOLE-BETAMETHASONE 1-0.05 % EX LOTN: TOPICAL_LOTION | Freq: Two times a day (BID) | CUTANEOUS | 0 refills | Status: DC

## 2017-01-13 NOTE — Telephone Encounter (Signed)
Patient stopped by the office again today regarding his script for lotrisone to be sent to [facility]  Thanks

## 2017-01-13 NOTE — Telephone Encounter (Signed)
RX request was already sent to provider.

## 2017-01-13 NOTE — Telephone Encounter (Signed)
Pt aware that RX was sent.

## 2017-01-26 ENCOUNTER — Other Ambulatory Visit: Payer: Self-pay | Admitting: Family Medicine

## 2017-01-26 MED ORDER — VALACYCLOVIR HCL 500 MG PO TABS: 500.0000 mg | ORAL_TABLET | Freq: Two times a day (BID) | ORAL | 3 refills | Status: DC

## 2017-01-26 NOTE — Telephone Encounter (Signed)
Copied from [facility] #[id number]. Topic: Quick Communication - See Telephone Encounter >> Jan 26, 2017 11:21 AM Ahmed Prima L wrote: CRM for notification. See Telephone encounter for:   Needs refill on balacyclobir 500 mg. Needs sent to the [facility]  01/26/17.

## 2017-01-26 NOTE — Telephone Encounter (Signed)
Patient has received Rx from [facility] in the past- can not find Valacyclovir 500 mg ordered by this office. Patient is requesting from this office.

## 2017-01-29 DIAGNOSIS — H60391 Other infective otitis externa, right ear: Secondary | ICD-10-CM | POA: Diagnosis not present

## 2017-01-29 DIAGNOSIS — H6981 Other specified disorders of Eustachian tube, right ear: Secondary | ICD-10-CM | POA: Diagnosis not present

## 2017-01-29 DIAGNOSIS — H6121 Impacted cerumen, right ear: Secondary | ICD-10-CM | POA: Diagnosis not present

## 2017-02-02 ENCOUNTER — Ambulatory Visit (INDEPENDENT_AMBULATORY_CARE_PROVIDER_SITE_OTHER): Payer: Medicare Other | Admitting: Family Medicine

## 2017-02-02 ENCOUNTER — Encounter: Payer: Self-pay | Admitting: Family Medicine

## 2017-02-02 VITALS — BP 132/60 | HR 66 | Wt 302.0 lb

## 2017-02-02 DIAGNOSIS — E785 Hyperlipidemia, unspecified: Secondary | ICD-10-CM

## 2017-02-02 DIAGNOSIS — Z23 Encounter for immunization: Secondary | ICD-10-CM

## 2017-02-02 DIAGNOSIS — L304 Erythema intertrigo: Secondary | ICD-10-CM | POA: Diagnosis not present

## 2017-02-02 DIAGNOSIS — I10 Essential (primary) hypertension: Secondary | ICD-10-CM | POA: Diagnosis not present

## 2017-02-02 DIAGNOSIS — E119 Type 2 diabetes mellitus without complications: Secondary | ICD-10-CM

## 2017-02-02 DIAGNOSIS — R300 Dysuria: Secondary | ICD-10-CM | POA: Diagnosis not present

## 2017-02-02 LAB — URINALYSIS, ROUTINE W REFLEX MICROSCOPIC
Bilirubin, UA: NEGATIVE
Glucose, UA: NEGATIVE
Ketones, UA: NEGATIVE
Leukocytes, UA: NEGATIVE
Nitrite, UA: NEGATIVE
Protein, UA: NEGATIVE
RBC, UA: NEGATIVE
Specific Gravity, UA: 1.015 (ref 1.005–1.030)
Urobilinogen, Ur: 1 mg/dL (ref 0.2–1.0)
pH, UA: 6 (ref 5.0–7.5)

## 2017-02-02 LAB — LP+ALT+AST PICCOLO, WAIVED
ALT (SGPT) Piccolo, Waived: 29 U/L (ref 10–47)
AST (SGOT) Piccolo, Waived: 32 U/L (ref 11–38)
Chol/HDL Ratio Piccolo,Waive: 3.4 mg/dL
Cholesterol Piccolo, Waived: 89 mg/dL (ref <200)
HDL Chol Piccolo, Waived: 26 mg/dL — ABNORMAL LOW (ref >59)
LDL Chol Calc Piccolo Waived: 10 mg/dL (ref <100)
Triglycerides Piccolo,Waived: 262 mg/dL — ABNORMAL HIGH (ref <150)
VLDL Chol Calc Piccolo,Waive: 52 mg/dL — ABNORMAL HIGH (ref <30)

## 2017-02-02 LAB — BAYER DCA HB A1C WAIVED: HB A1C (BAYER DCA - WAIVED): 6.9 % (ref <7.0)

## 2017-02-02 MED ORDER — TERBINAFINE HCL 250 MG PO TABS: 250.0000 mg | ORAL_TABLET | Freq: Every day | ORAL | 0 refills | Status: DC

## 2017-02-02 MED ORDER — NYSTATIN 100000 UNIT/GM EX POWD: Freq: Four times a day (QID) | CUTANEOUS | 2 refills | Status: DC

## 2017-02-02 NOTE — Progress Notes (Signed)
BP 132/60   Pulse 66   Wt (!) 302 lb (137 kg)   SpO2 98%   BMI 42.12 kg/m    Subjective:    Patient ID: Justin Griffith, male    DOB: November 19, 1940, 76 y.o.   MRN: [id number]  HPI: Justin Griffith is a 76 y.o. male  Chief Complaint  Patient presents with  . Follow-up  . Hypertension  . Groin Pain    Broken out. Lotion not working.   patient with marked intertrigo changes in left groin area not responding to Lotrisone cream. Reviewed last hemoglobin A1c indicating diabetes markedly improved. Patient otherwise doing well with good blood pressure control Having marked smell of urine with frequent urination. Patient also need surgical clearance for ear surgery  Relevant past medical, surgical, family and social history reviewed and updated as indicated. Interim medical history since our last visit reviewed. Allergies and medications reviewed and updated.  Review of Systems  Constitutional: Negative.   Respiratory: Negative.   Cardiovascular: Negative.     Per HPI unless specifically indicated above     Objective:    BP 132/60   Pulse 66   Wt (!) 302 lb (137 kg)   SpO2 98%   BMI 42.12 kg/m   Wt Readings from Last 3 Encounters:  02/02/17 (!) 302 lb (137 kg)  08/31/16 (!) 306 lb (138.8 kg)  07/23/16 (!) 309 lb 12.8 oz (140.5 kg)    Physical Exam  Constitutional: He is oriented to person, place, and time. He appears well-developed and well-nourished.  HENT:  Head: Normocephalic and atraumatic.  Eyes: Conjunctivae and EOM are normal.  Neck: Normal range of motion.  Cardiovascular: Normal rate, regular rhythm and normal heart sounds.  Pulmonary/Chest: Effort normal and breath sounds normal.  Musculoskeletal: Normal range of motion.  Neurological: He is alert and oriented to person, place, and time.  Skin: No erythema.  Marked intertrigo changes especially left groin area  Psychiatric: He has a normal mood and affect. His behavior is normal. Judgment and thought  content normal.    Results for orders placed or performed in visit on 08/31/16  Microscopic Examination  Result Value Ref Range   WBC, UA 0-5 0 - 5 /hpf   RBC, UA 0-2 0 - 2 /hpf   Epithelial Cells (non renal) CANCELED    Bacteria, UA None seen None seen/Few  Urinalysis, Routine w reflex microscopic  Result Value Ref Range   Specific Gravity, UA 1.010 1.005 - 1.030   pH, UA 5.5 5.0 - 7.5   Color, UA Yellow Yellow   Appearance Ur Clear Clear   Leukocytes, UA Negative Negative   Protein, UA Negative Negative/Trace   Glucose, UA Negative Negative   Ketones, UA Negative Negative   RBC, UA Negative Negative   Bilirubin, UA Negative Negative   Urobilinogen, Ur 0.2 0.2 - 1.0 mg/dL   Nitrite, UA Negative Negative   Microscopic Examination See below:       Assessment & Plan:   Problem List Items Addressed This Visit      Cardiovascular and Mediastinum   Hypertension    The current medical regimen is effective;  continue present plan and medications.       Relevant Orders   Basic metabolic panel   LP+ALT+AST Piccolo, Waived     Endocrine   Diabetes mellitus without complication ([place]) - Primary    Followed by endocrinology but A1c today here of 6.9 indicating good control.  Relevant Orders   Basic metabolic panel   LP+ALT+AST Piccolo, Waived     Musculoskeletal and Integument   Intertrigo    Discuss main goal of intertrigo treatment is  Diabetes control which patient is doing well with. Also staying dry is a main goal. Discussed drying techniques. This is difficult due to the patient's body habitus and excess weight. We will use nystatin powder and Lamisil pills. Discuss hair drying of groin area.        Other   Hyperlipidemia    The current medical regimen is effective;  continue present plan and medications.       Relevant Orders   Basic metabolic panel   LP+ALT+AST Piccolo, Waived   Dysuria    Urinalysis totally clear will not make any changes.       Relevant Orders   Urinalysis, Routine w reflex microscopic    Other Visit Diagnoses    Needs flu shot       Relevant Orders   Flu vaccine HIGH DOSE PF (Completed)     patient cleared for surgery is moderate risk because of multiple medical problems but these are stable  Follow up plan: Return in about 4 weeks (around 03/02/2017), or if symptoms worsen or fail to improve.

## 2017-02-02 NOTE — Assessment & Plan Note (Signed)
Followed by endocrinology but A1c today here of 6.9 indicating good control.

## 2017-02-02 NOTE — Assessment & Plan Note (Signed)
The current medical regimen is effective;  continue present plan and medications.  

## 2017-02-02 NOTE — Assessment & Plan Note (Signed)
Urinalysis totally clear will not make any changes.

## 2017-02-02 NOTE — Assessment & Plan Note (Signed)
Discuss main goal of intertrigo treatment is  Diabetes control which patient is doing well with. Also staying dry is a main goal. Discussed drying techniques. This is difficult due to the patient's body habitus and excess weight. We will use nystatin powder and Lamisil pills. Discuss hair drying of groin area.

## 2017-02-03 ENCOUNTER — Encounter: Payer: Self-pay | Admitting: Family Medicine

## 2017-02-03 LAB — BASIC METABOLIC PANEL
BUN/Creatinine Ratio: 16 (ref 10–24)
BUN: 21 mg/dL (ref 8–27)
CO2: 26 mmol/L (ref 20–29)
Calcium: 10.6 mg/dL — ABNORMAL HIGH (ref 8.6–10.2)
Chloride: 99 mmol/L (ref 96–106)
Creatinine, Ser: 1.3 mg/dL — ABNORMAL HIGH (ref 0.76–1.27)
GFR calc Af Amer: 61 mL/min/{1.73_m2} (ref >59)
GFR calc non Af Amer: 53 mL/min/{1.73_m2} — ABNORMAL LOW (ref >59)
Glucose: 172 mg/dL — ABNORMAL HIGH (ref 65–99)
Potassium: 4.7 mmol/L (ref 3.5–5.2)
Sodium: 138 mmol/L (ref 134–144)

## 2017-02-16 DIAGNOSIS — H6122 Impacted cerumen, left ear: Secondary | ICD-10-CM | POA: Diagnosis not present

## 2017-02-16 DIAGNOSIS — H7121 Cholesteatoma of mastoid, right ear: Secondary | ICD-10-CM | POA: Diagnosis not present

## 2017-03-02 DIAGNOSIS — H6980 Other specified disorders of Eustachian tube, unspecified ear: Secondary | ICD-10-CM | POA: Diagnosis not present

## 2017-03-02 DIAGNOSIS — H7121 Cholesteatoma of mastoid, right ear: Secondary | ICD-10-CM | POA: Diagnosis not present

## 2017-03-04 ENCOUNTER — Ambulatory Visit (INDEPENDENT_AMBULATORY_CARE_PROVIDER_SITE_OTHER): Payer: Medicare Other | Admitting: Family Medicine

## 2017-03-04 ENCOUNTER — Encounter: Payer: Self-pay | Admitting: Family Medicine

## 2017-03-04 DIAGNOSIS — L304 Erythema intertrigo: Secondary | ICD-10-CM

## 2017-03-04 DIAGNOSIS — I10 Essential (primary) hypertension: Secondary | ICD-10-CM | POA: Diagnosis not present

## 2017-03-04 DIAGNOSIS — Z6841 Body Mass Index (BMI) 40.0 and over, adult: Secondary | ICD-10-CM

## 2017-03-04 DIAGNOSIS — E785 Hyperlipidemia, unspecified: Secondary | ICD-10-CM

## 2017-03-04 NOTE — Assessment & Plan Note (Signed)
The current medical regimen is effective;  continue present plan and medications.  

## 2017-03-04 NOTE — Progress Notes (Signed)
BP 127/75   Pulse 64   Wt (!) 302 lb (137 kg)   SpO2 98%   BMI 42.12 kg/m    Subjective:    Patient ID: Justin Griffith, male    DOB: [birth year], 76 y.o.   MRN: [id number]  HPI: Justin Griffith is a 76 y.o. male  Follow-up medications.  Patient intertrigo has cleared up. Using [employer] medicated powder now to keep things dry and cleared up. Blood pressure staying good. No problems  Working with endocrinology on diabetes and getting good reports. Cholesterol reflux alsodoing well.  Relevant past medical, surgical, family and social history reviewed and updated as indicated. Interim medical history since our last visit reviewed. Allergies and medications reviewed and updated.  Review of Systems  Constitutional: Negative.   Respiratory: Negative.   Cardiovascular: Negative.     Per HPI unless specifically indicated above     Objective:    BP 127/75   Pulse 64   Wt (!) 302 lb (137 kg)   SpO2 98%   BMI 42.12 kg/m   Wt Readings from Last 3 Encounters:  03/04/17 (!) 302 lb (137 kg)  02/02/17 (!) 302 lb (137 kg)  08/31/16 (!) 306 lb (138.8 kg)    Physical Exam  Constitutional: He is oriented to person, place, and time. He appears well-developed and well-nourished.  HENT:  Head: Normocephalic and atraumatic.  Eyes: Conjunctivae and EOM are normal.  Neck: Normal range of motion.  Cardiovascular: Normal rate, regular rhythm and normal heart sounds.  Pulmonary/Chest: Effort normal and breath sounds normal.  Musculoskeletal: Normal range of motion.  Neurological: He is alert and oriented to person, place, and time.  Skin: No erythema.  Psychiatric: He has a normal mood and affect. His behavior is normal. Judgment and thought content normal.    Results for orders placed or performed in visit on 22/97/98  Basic metabolic panel  Result Value Ref Range   Glucose 172 (H) 65 - 99 mg/dL   BUN 21 8 - 27 mg/dL   Creatinine, Ser 1.30 (H) 0.76 - 1.27 mg/dL   GFR calc non Af  Amer 53 (L) >59 mL/min/1.73   GFR calc Af Amer 61 >59 mL/min/1.73   BUN/Creatinine Ratio 16 10 - 24   Sodium 138 134 - 144 mmol/L   Potassium 4.7 3.5 - 5.2 mmol/L   Chloride 99 96 - 106 mmol/L   CO2 26 20 - 29 mmol/L   Calcium 10.6 (H) 8.6 - 10.2 mg/dL  LP+ALT+AST Piccolo, Waived  Result Value Ref Range   ALT (SGPT) Piccolo, Waived 29 10 - 47 U/L   AST (SGOT) Piccolo, Waived 32 11 - 38 U/L   Cholesterol Piccolo, Waived 89 <200 mg/dL   HDL Chol Piccolo, Waived 26 (L) >59 mg/dL   Triglycerides Piccolo,Waived 262 (H) <150 mg/dL   Chol/HDL Ratio Piccolo,Waive 3.4 mg/dL   LDL Chol Calc Piccolo Waived 10 <100 mg/dL   VLDL Chol Calc Piccolo,Waive 52 (H) <30 mg/dL  Urinalysis, Routine w reflex microscopic  Result Value Ref Range   Specific Gravity, UA 1.015 1.005 - 1.030   pH, UA 6.0 5.0 - 7.5   Color, UA Yellow Yellow   Appearance Ur Clear Clear   Leukocytes, UA Negative Negative   Protein, UA Negative Negative/Trace   Glucose, UA Negative Negative   Ketones, UA Negative Negative   RBC, UA Negative Negative   Bilirubin, UA Negative Negative   Urobilinogen, Ur 1.0 0.2 - 1.0 mg/dL  Nitrite, UA Negative Negative  Bayer DCA Hb A1c Waived  Result Value Ref Range   Bayer DCA Hb A1c Waived 6.9 <7.0 %      Assessment & Plan:   Problem List Items Addressed This Visit      Cardiovascular and Mediastinum   Hypertension    The current medical regimen is effective;  continue present plan and medications.         Musculoskeletal and Integument   Intertrigo    Resolved and controlled.        Other   Hyperlipidemia    The current medical regimen is effective;  continue present plan and medications.       BMI 40.0-44.9, adult [facility])    Discussed and encouraged weight loss          Follow up plan: Return in about 6 months (around 09/02/2017) for Physical Exam.

## 2017-03-04 NOTE — Assessment & Plan Note (Signed)
Discussed and encouraged weight loss

## 2017-03-04 NOTE — Assessment & Plan Note (Signed)
Resolved and controlled.

## 2017-03-05 DIAGNOSIS — Z961 Presence of intraocular lens: Secondary | ICD-10-CM | POA: Diagnosis not present

## 2017-03-05 DIAGNOSIS — E119 Type 2 diabetes mellitus without complications: Secondary | ICD-10-CM | POA: Diagnosis not present

## 2017-03-05 DIAGNOSIS — H35373 Puckering of macula, bilateral: Secondary | ICD-10-CM | POA: Diagnosis not present

## 2017-03-18 DIAGNOSIS — H6983 Other specified disorders of Eustachian tube, bilateral: Secondary | ICD-10-CM | POA: Diagnosis not present

## 2017-03-18 DIAGNOSIS — H7121 Cholesteatoma of mastoid, right ear: Secondary | ICD-10-CM | POA: Diagnosis not present

## 2017-03-26 DIAGNOSIS — B351 Tinea unguium: Secondary | ICD-10-CM | POA: Diagnosis not present

## 2017-03-26 DIAGNOSIS — M79674 Pain in right toe(s): Secondary | ICD-10-CM | POA: Diagnosis not present

## 2017-03-26 DIAGNOSIS — M79675 Pain in left toe(s): Secondary | ICD-10-CM | POA: Diagnosis not present

## 2017-04-20 DIAGNOSIS — H7121 Cholesteatoma of mastoid, right ear: Secondary | ICD-10-CM | POA: Diagnosis not present

## 2017-04-20 DIAGNOSIS — H6983 Other specified disorders of Eustachian tube, bilateral: Secondary | ICD-10-CM | POA: Diagnosis not present

## 2017-04-29 ENCOUNTER — Ambulatory Visit (INDEPENDENT_AMBULATORY_CARE_PROVIDER_SITE_OTHER): Payer: Medicare Other | Admitting: Family Medicine

## 2017-04-29 ENCOUNTER — Ambulatory Visit
Admission: RE | Admit: 2017-04-29 | Discharge: 2017-04-29 | Disposition: A | Discharge status: Home / Self Care | Payer: Medicare Other | Source: Ambulatory Visit | Attending: Family Medicine | Admitting: Family Medicine

## 2017-04-29 ENCOUNTER — Encounter: Payer: Self-pay | Admitting: Family Medicine

## 2017-04-29 DIAGNOSIS — R0602 Shortness of breath: Secondary | ICD-10-CM | POA: Diagnosis not present

## 2017-04-29 DIAGNOSIS — J449 Chronic obstructive pulmonary disease, unspecified: Secondary | ICD-10-CM | POA: Insufficient documentation

## 2017-04-29 MED ORDER — FLUTICASONE FUROATE-VILANTEROL 100-25 MCG/INH IN AEPB: 1.0000 | INHALATION_SPRAY | Freq: Every day | RESPIRATORY_TRACT | 0 refills | Status: DC

## 2017-04-29 NOTE — Progress Notes (Signed)
BP (!) 161/78   Pulse 60   Wt (!) 310 lb (140.6 kg)   SpO2 94%   BMI 43.24 kg/m    Subjective:    Patient ID: Justin Griffith, male    DOB: 02/26/1941, 77 y.o.   MRN: [id number]  HPI: Justin Griffith is a 77 y.o. male  Chief Complaint  Patient presents with  . COPD    Breathing difficulty  . Tremors   Patient with shortness of breath not associated with exertion more with cough has used Advair which seems to help.  Patient is a non-smoker quit smoking years and years ago. On review no angina-like symptoms.  Some coughing nonproductive no blood.  Patient's tremor is been ongoing for about a month sometimes with intention such as pulling out his hearing aids are trying to grab a door handle.  Is able to eat without any issues no problems handwriting. No change in medication.  Relevant past medical, surgical, family and social history reviewed and updated as indicated. Interim medical history since our last visit reviewed. Allergies and medications reviewed and updated.  Review of Systems  Constitutional: Negative.   Respiratory: Negative.   Cardiovascular: Negative.     Per HPI unless specifically indicated above     Objective:    BP (!) 161/78   Pulse 60   Wt (!) 310 lb (140.6 kg)   SpO2 94%   BMI 43.24 kg/m   Wt Readings from Last 3 Encounters:  04/29/17 (!) 310 lb (140.6 kg)  03/04/17 (!) 302 lb (137 kg)  02/02/17 (!) 302 lb (137 kg)    Physical Exam  Constitutional: He is oriented to person, place, and time. He appears well-developed and well-nourished.  HENT:  Head: Normocephalic and atraumatic.  Eyes: Conjunctivae and EOM are normal.  Neck: Normal range of motion.  Cardiovascular: Normal rate, regular rhythm and normal heart sounds.  Pulmonary/Chest: Effort normal and breath sounds normal. No respiratory distress. He has no wheezes. He has no rales. He exhibits no tenderness.  Musculoskeletal: Normal range of motion.  Tremor noticed pulling out his  hearing aids  Neurological: He is alert and oriented to person, place, and time.  Skin: No erythema.  Psychiatric: He has a normal mood and affect. His behavior is normal. Judgment and thought content normal.    Results for orders placed or performed in visit on 16/60/63  Basic metabolic panel  Result Value Ref Range   Glucose 172 (H) 65 - 99 mg/dL   BUN 21 8 - 27 mg/dL   Creatinine, Ser 1.30 (H) 0.76 - 1.27 mg/dL   GFR calc non Af Amer 53 (L) >59 mL/min/1.73   GFR calc Af Amer 61 >59 mL/min/1.73   BUN/Creatinine Ratio 16 10 - 24   Sodium 138 134 - 144 mmol/L   Potassium 4.7 3.5 - 5.2 mmol/L   Chloride 99 96 - 106 mmol/L   CO2 26 20 - 29 mmol/L   Calcium 10.6 (H) 8.6 - 10.2 mg/dL  LP+ALT+AST Piccolo, Waived  Result Value Ref Range   ALT (SGPT) Piccolo, Waived 29 10 - 47 U/L   AST (SGOT) Piccolo, Waived 32 11 - 38 U/L   Cholesterol Piccolo, Waived 89 <200 mg/dL   HDL Chol Piccolo, Waived 26 (L) >59 mg/dL   Triglycerides Piccolo,Waived 262 (H) <150 mg/dL   Chol/HDL Ratio Piccolo,Waive 3.4 mg/dL   LDL Chol Calc Piccolo Waived 10 <100 mg/dL   VLDL Chol Calc Piccolo,Waive 52 (H) <30 mg/dL  Urinalysis, Routine w reflex microscopic  Result Value Ref Range   Specific Gravity, UA 1.015 1.005 - 1.030   pH, UA 6.0 5.0 - 7.5   Color, UA Yellow Yellow   Appearance Ur Clear Clear   Leukocytes, UA Negative Negative   Protein, UA Negative Negative/Trace   Glucose, UA Negative Negative   Ketones, UA Negative Negative   RBC, UA Negative Negative   Bilirubin, UA Negative Negative   Urobilinogen, Ur 1.0 0.2 - 1.0 mg/dL   Nitrite, UA Negative Negative  Bayer DCA Hb A1c Waived  Result Value Ref Range   Bayer DCA Hb A1c Waived 6.9 <7.0 %      Assessment & Plan:   Problem List Items Addressed This Visit      Respiratory   COPD (chronic obstructive pulmonary disease) (HCC)    Spirometry no response with albuterol.  More restrictive changes possibly secondary to patient's large abdominal  girth. Gave Brio inhalers and will do pulmonary referral.      Relevant Medications   fluticasone furoate-vilanterol (BREO ELLIPTA) 100-25 MCG/INH AEPB   Other Relevant Orders   Spirometry with Graph (Completed)   DG Chest 2 View   Ambulatory referral to Pulmonology    Nonspecific tremor patient declining further referral for now  Follow up plan: Return if symptoms worsen or fail to improve, for As scheduled.

## 2017-04-29 NOTE — Assessment & Plan Note (Signed)
Spirometry no response with albuterol.  More restrictive changes possibly secondary to patient's large abdominal girth. Gave Brio inhalers and will do pulmonary referral.

## 2017-05-05 DIAGNOSIS — R809 Proteinuria, unspecified: Secondary | ICD-10-CM | POA: Diagnosis not present

## 2017-05-05 DIAGNOSIS — E785 Hyperlipidemia, unspecified: Secondary | ICD-10-CM | POA: Diagnosis not present

## 2017-05-05 DIAGNOSIS — I10 Essential (primary) hypertension: Secondary | ICD-10-CM | POA: Diagnosis not present

## 2017-05-05 DIAGNOSIS — E1169 Type 2 diabetes mellitus with other specified complication: Secondary | ICD-10-CM | POA: Diagnosis not present

## 2017-05-05 DIAGNOSIS — E1159 Type 2 diabetes mellitus with other circulatory complications: Secondary | ICD-10-CM | POA: Diagnosis not present

## 2017-05-05 DIAGNOSIS — E1129 Type 2 diabetes mellitus with other diabetic kidney complication: Secondary | ICD-10-CM | POA: Diagnosis not present

## 2017-05-05 DIAGNOSIS — E1142 Type 2 diabetes mellitus with diabetic polyneuropathy: Secondary | ICD-10-CM | POA: Diagnosis not present

## 2017-05-05 DIAGNOSIS — Z794 Long term (current) use of insulin: Secondary | ICD-10-CM | POA: Diagnosis not present

## 2017-05-11 ENCOUNTER — Other Ambulatory Visit: Payer: Self-pay | Admitting: Otolaryngology

## 2017-05-11 DIAGNOSIS — H906 Mixed conductive and sensorineural hearing loss, bilateral: Secondary | ICD-10-CM | POA: Diagnosis not present

## 2017-05-11 DIAGNOSIS — H7121 Cholesteatoma of mastoid, right ear: Secondary | ICD-10-CM

## 2017-05-11 DIAGNOSIS — J0100 Acute maxillary sinusitis, unspecified: Secondary | ICD-10-CM | POA: Diagnosis not present

## 2017-05-13 ENCOUNTER — Encounter: Payer: Self-pay | Admitting: Family Medicine

## 2017-05-21 ENCOUNTER — Ambulatory Visit
Admission: RE | Admit: 2017-05-21 | Discharge: 2017-05-21 | Disposition: A | Discharge status: Home / Self Care | Payer: Medicare Other | Source: Ambulatory Visit | Attending: Otolaryngology | Admitting: Otolaryngology

## 2017-05-21 DIAGNOSIS — H7013 Chronic mastoiditis, bilateral: Secondary | ICD-10-CM | POA: Diagnosis not present

## 2017-05-21 DIAGNOSIS — H6692 Otitis media, unspecified, left ear: Secondary | ICD-10-CM | POA: Diagnosis not present

## 2017-05-21 DIAGNOSIS — H903 Sensorineural hearing loss, bilateral: Secondary | ICD-10-CM | POA: Diagnosis not present

## 2017-05-21 DIAGNOSIS — H7121 Cholesteatoma of mastoid, right ear: Secondary | ICD-10-CM | POA: Diagnosis not present

## 2017-05-27 DIAGNOSIS — Z6841 Body Mass Index (BMI) 40.0 and over, adult: Secondary | ICD-10-CM | POA: Diagnosis not present

## 2017-05-27 DIAGNOSIS — R0602 Shortness of breath: Secondary | ICD-10-CM | POA: Diagnosis not present

## 2017-05-27 DIAGNOSIS — J449 Chronic obstructive pulmonary disease, unspecified: Secondary | ICD-10-CM | POA: Diagnosis not present

## 2017-05-27 DIAGNOSIS — G4733 Obstructive sleep apnea (adult) (pediatric): Secondary | ICD-10-CM | POA: Diagnosis not present

## 2017-06-08 DIAGNOSIS — B379 Candidiasis, unspecified: Secondary | ICD-10-CM | POA: Diagnosis not present

## 2017-06-08 DIAGNOSIS — H7101 Cholesteatoma of attic, right ear: Secondary | ICD-10-CM | POA: Diagnosis not present

## 2017-06-10 IMAGING — MR MR LUMBAR SPINE W/O CM
4 of 5 series · 26 of 48 positions shown · non-contrast
Comparison: None.

CLINICAL DATA: Osteoarthritis of the lumbar spine with right
radiculopathy. Back pain radiates to the right groin.

Remote history of colon cancer
EXAM:
MRI LUMBAR SPINE WITHOUT CONTRAST
TECHNIQUE: Multiplanar, multisequence MR imaging of the lumbar spine was
performed. No intravenous contrast was administered.

[Series 2: T2 · sagittal · 4.0mm · 1.02mm/px · 6 of 15 slices shown (1 of 2)]
[im 1/15]
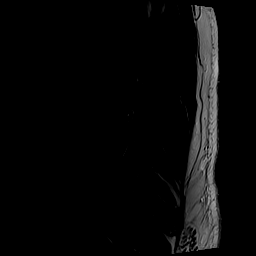
[im 3/15]
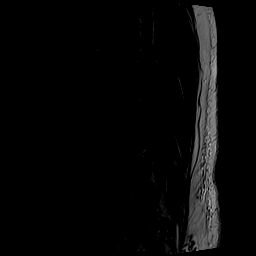
[im 6/15]
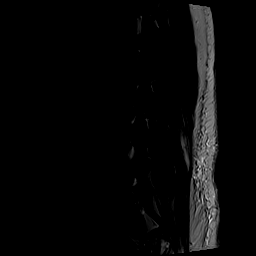
[im 9/15]
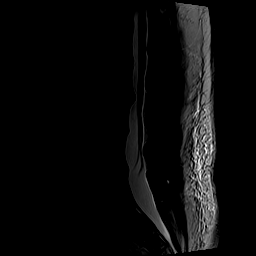
[im 12/15]
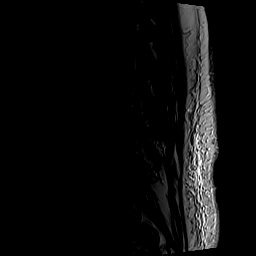
[im 15/15]
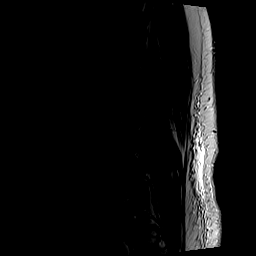

[Series 3: T1 · sagittal · 4.0mm · 0.51mm/px · 6 of 15 slices shown (1 of 2)]
[im 1/15]
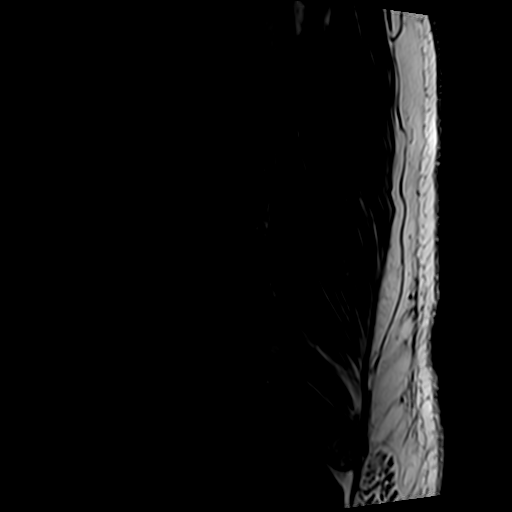
[im 3/15]
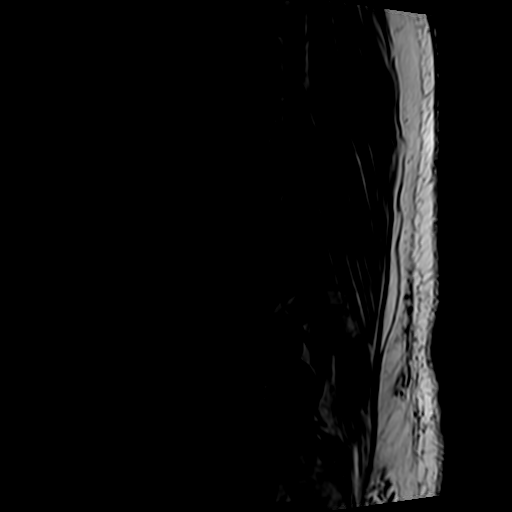
[im 6/15]
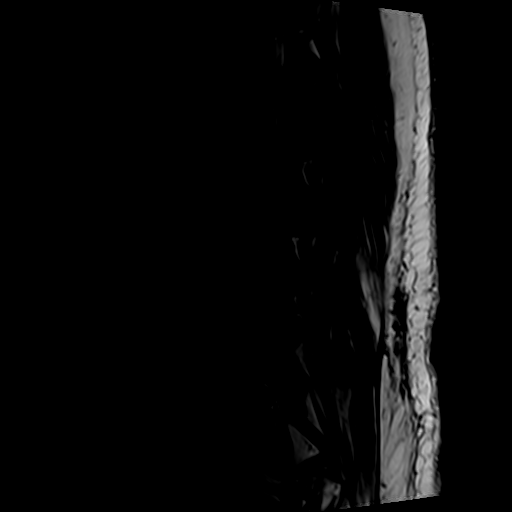
[im 9/15]
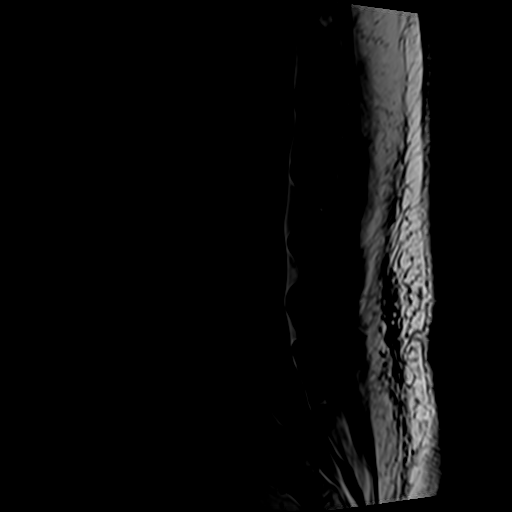
[im 12/15]
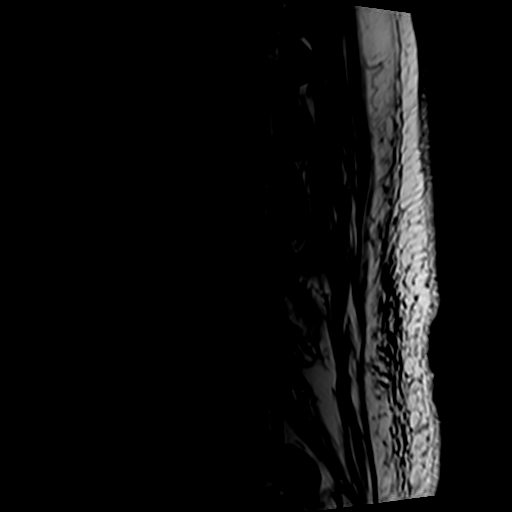
[im 15/15]
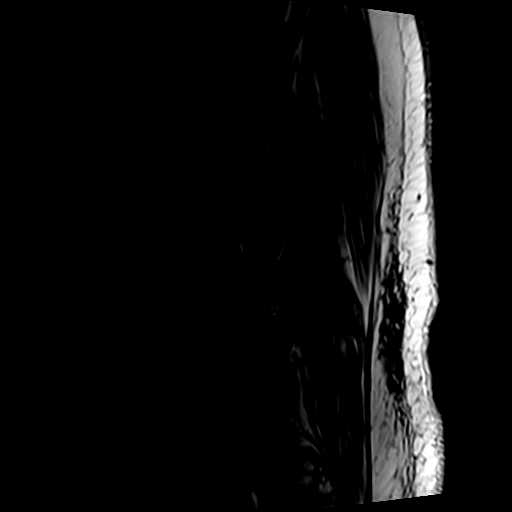

[Series 5: T2 · axial · 4.0mm · 0.78mm/px · z∈[-43,+167]mm · 9 of 40 slices shown (2 of 2)]
[im 1/40]
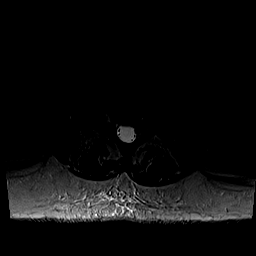
[im 6/40]
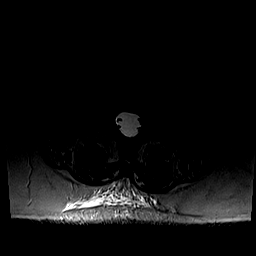
[im 12/40]
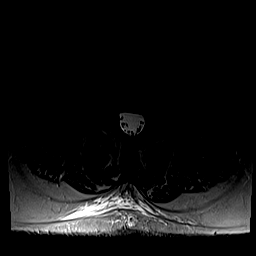
[im 17/40]
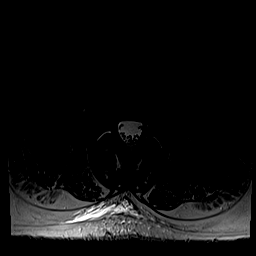
[im 20/40]
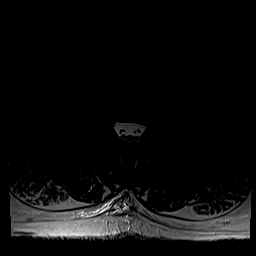
[im 23/40]
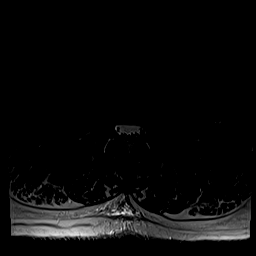
[im 28/40]
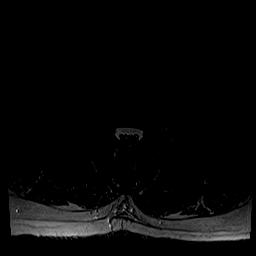
[im 34/40]
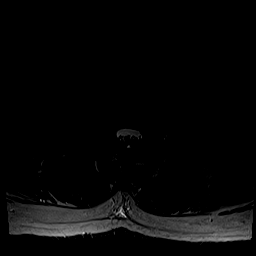
[im 40/40]
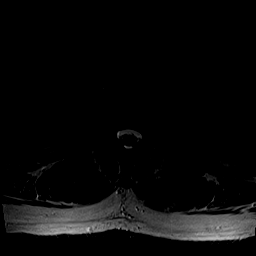

[Series 6: T1 · axial · 4.0mm · 0.31mm/px · z∈[-43,+137]mm · 5 of 40 slices shown (2 of 2)]
[im 1/40]
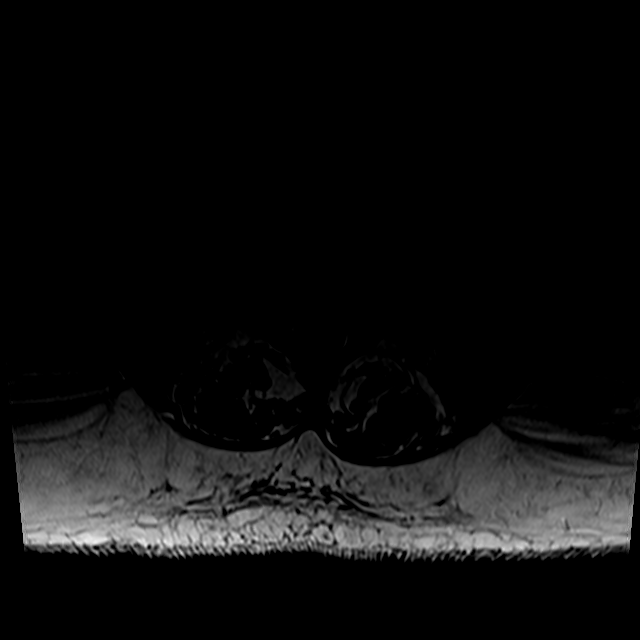
[im 6/40]
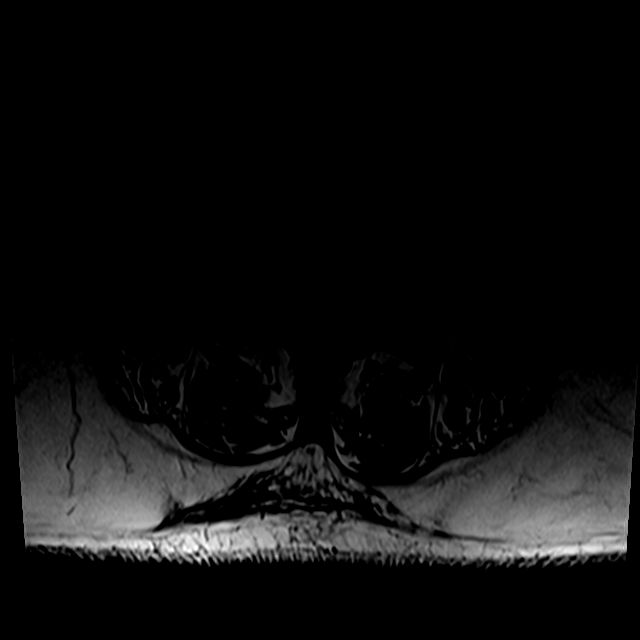
[im 12/40]
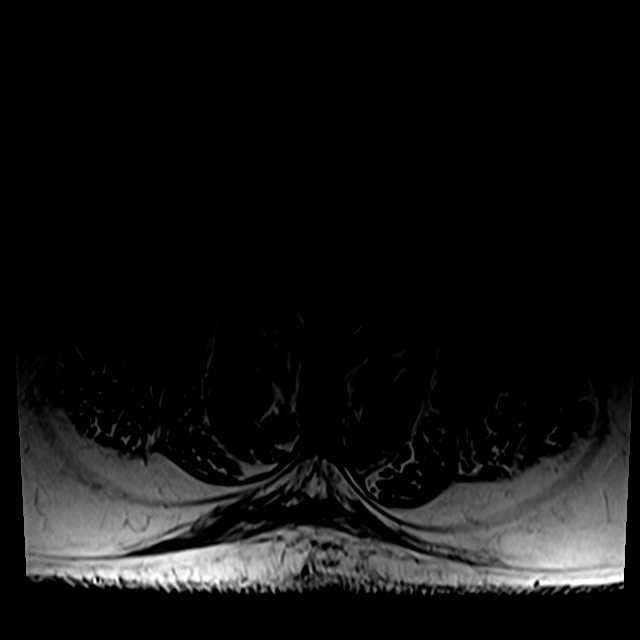
[im 20/40]
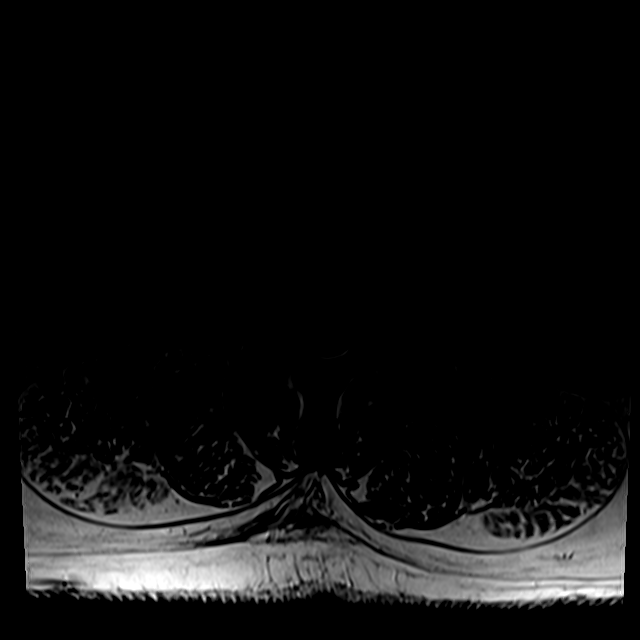
[im 34/40]
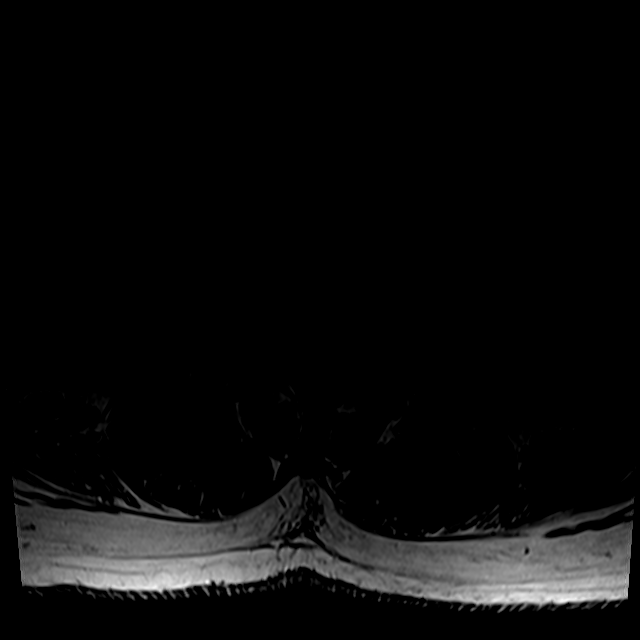

[26 of 48 positions shown; findings below may reference images not displayed]

FINDINGS: T1 and T2 hypo intense signal around the L3 superior and inferior
endplates is likely degenerative, similar to Schmorl's nodes seen
around the L4 inferior endplate. No evidence of fracture or
infection. Normal conus signal and morphology. No perispinal
abnormality to explain back pain.

Degenerative changes:

T12- L1: Unremarkable.

L1-L2: Mild disc bulging.  No impingement.

L2-L3: Moderate disc narrowing and bulging. Facet arthropathy with
small effusions. Crowding of the subarticular recesses and mild
thecal sac deformation without definite impingement. No foraminal
nerve compression.

L3-L4: Disc narrowing and posterior annular fissure. A tiny central
disc protrusion is present, without neural contact. Small left
foraminal protrusion, without nerve compression or high-grade
foraminal stenosis. Facet arthropathy with minimal overgrowth but
bilateral joint effusions. No neural compression.

L4-L5: Disc narrowing and bulging, greater to the right foraminal
region. Facet overgrowth, greater on the right. Moderate right
foraminal stenosis with partial effacement of perineural fat.

L5-S1:Mild disc bulging, greater to the left foraminal region. Mild
right and moderate left facet arthropathy with overgrowth. No neural
impingement.
IMPRESSION: 1. Lumbar degenerative disc and facet disease, as above.
2. No definitive source of right radiculopathy. Neural impingement
is greatest at the right L4-5 foramen.

## 2017-06-24 DIAGNOSIS — E1140 Type 2 diabetes mellitus with diabetic neuropathy, unspecified: Secondary | ICD-10-CM | POA: Diagnosis not present

## 2017-06-24 DIAGNOSIS — L851 Acquired keratosis [keratoderma] palmaris et plantaris: Secondary | ICD-10-CM | POA: Diagnosis not present

## 2017-06-24 DIAGNOSIS — Z794 Long term (current) use of insulin: Secondary | ICD-10-CM | POA: Diagnosis not present

## 2017-06-24 DIAGNOSIS — B351 Tinea unguium: Secondary | ICD-10-CM | POA: Diagnosis not present

## 2017-08-05 ENCOUNTER — Encounter: Payer: Medicare Other | Admitting: Family Medicine

## 2017-08-11 DIAGNOSIS — H7101 Cholesteatoma of attic, right ear: Secondary | ICD-10-CM | POA: Diagnosis not present

## 2017-08-11 DIAGNOSIS — H6982 Other specified disorders of Eustachian tube, left ear: Secondary | ICD-10-CM | POA: Diagnosis not present

## 2017-08-20 ENCOUNTER — Ambulatory Visit (INDEPENDENT_AMBULATORY_CARE_PROVIDER_SITE_OTHER): Payer: Medicare Other

## 2017-08-20 VITALS — BP 146/82 | HR 60 | Temp 98.0°F | Resp 17 | Ht 70.0 in | Wt 306.7 lb

## 2017-08-20 DIAGNOSIS — Z0000 Encounter for general adult medical examination without abnormal findings: Secondary | ICD-10-CM | POA: Diagnosis not present

## 2017-08-20 NOTE — Patient Instructions (Addendum)
[name] , Thank you for taking time to come for your Medicare Wellness Visit. I appreciate your ongoing commitment to your health goals. Please review the following plan we discussed and let me know if I can assist you in the future.   Screening recommendations/referrals: Colonoscopy: colonoscopy completed by Dr.Burnett 02/12/2016 Recommended yearly ophthalmology/optometry visit for glaucoma screening and checkup Recommended yearly dental visit for hygiene and checkup  Vaccinations: Influenza vaccine: up to date Pneumococcal vaccine: up to date  Tdap vaccine: up to date  Shingles vaccine: shingrix eligible, check with your insurance company for coverage   Advanced directives: Advance directive discussed with you today. I have provided a copy for you to complete at home and have notarized. Once this is complete please bring a copy in to our office so we can scan it into your chart.  Conditions/risks identified: Recommend drinking at least 6-8 glasses of water a day   Next appointment: Follow up on 09/07/2017 at 1pm with Dr.Crissman. Follow up in one year for your annual wellness exam.  Preventive Care 78 Years and Older, Male Preventive care refers to lifestyle choices and visits with your health care provider that can promote health and wellness. What does preventive care include?  A yearly physical exam. This is also called an annual well check.  Dental exams once or twice a year.  Routine eye exams. Ask your health care provider how often you should have your eyes checked.  Personal lifestyle choices, including:  Daily care of your teeth and gums.  Regular physical activity.  Eating a healthy diet.  Avoiding tobacco and drug use.  Limiting alcohol use.  Practicing safe sex.  Taking low doses of aspirin every day.  Taking vitamin and mineral supplements as recommended by your health care provider. What happens during an annual well check? The services and screenings  done by your health care provider during your annual well check will depend on your age, overall health, lifestyle risk factors, and family history of disease. Counseling  Your health care provider may ask you questions about your:  Alcohol use.  Tobacco use.  Drug use.  Emotional well-being.  Home and relationship well-being.  Sexual activity.  Eating habits.  History of falls.  Memory and ability to understand (cognition).  Work and work Statistician. Screening  You may have the following tests or measurements:  Height, weight, and BMI.  Blood pressure.  Lipid and cholesterol levels. These may be checked every 5 years, or more frequently if you are over 78 years old.  Skin check.  Lung cancer screening. You may have this screening every year starting at age 78 if you have a 30-pack-year history of smoking and currently smoke or have quit within the past 15 years.  Fecal occult blood test (FOBT) of the stool. You may have this test every year starting at age 78.  Flexible sigmoidoscopy or colonoscopy. You may have a sigmoidoscopy every 5 years or a colonoscopy every 10 years starting at age 78.  Prostate cancer screening. Recommendations will vary depending on your family history and other risks.  Hepatitis C blood test.  Hepatitis B blood test.  Sexually transmitted disease (STD) testing.  Diabetes screening. This is done by checking your blood sugar (glucose) after you have not eaten for a while (fasting). You may have this done every 1-3 years.  Abdominal aortic aneurysm (AAA) screening. You may need this if you are a current or former smoker.  Osteoporosis. You may be screened starting  at age 78 if you are at high risk. Talk with your health care provider about your test results, treatment options, and if necessary, the need for more tests. Vaccines  Your health care provider may recommend certain vaccines, such as:  Influenza vaccine. This is recommended  every year.  Tetanus, diphtheria, and acellular pertussis (Tdap, Td) vaccine. You may need a Td booster every 10 years.  Zoster vaccine. You may need this after age 71.  Pneumococcal 13-valent conjugate (PCV13) vaccine. One dose is recommended after age 78.  Pneumococcal polysaccharide (PPSV23) vaccine. One dose is recommended after age 78. Talk to your health care provider about which screenings and vaccines you need and how often you need them. This information is not intended to replace advice given to you by your health care provider. Make sure you discuss any questions you have with your health care provider. Document Released: 04/05/2015 Document Revised: 11/27/2015 Document Reviewed: 01/08/2015 Elsevier Interactive Patient Education  2017 [place] Prevention in the Home Falls can cause injuries. They can happen to people of all ages. There are many things you can do to make your home safe and to help prevent falls. What can I do on the outside of my home?  Regularly fix the edges of walkways and driveways and fix any cracks.  Remove anything that might make you trip as you walk through a door, such as a raised step or threshold.  Trim any bushes or trees on the path to your home.  Use bright outdoor lighting.  Clear any walking paths of anything that might make someone trip, such as rocks or tools.  Regularly check to see if handrails are loose or broken. Make sure that both sides of any steps have handrails.  Any raised decks and porches should have guardrails on the edges.  Have any leaves, snow, or ice cleared regularly.  Use sand or salt on walking paths during winter.  Clean up any spills in your garage right away. This includes oil or grease spills. What can I do in the bathroom?  Use night lights.  Install grab bars by the toilet and in the tub and shower. Do not use towel bars as grab bars.  Use non-skid mats or decals in the tub or shower.  If  you need to sit down in the shower, use a plastic, non-slip stool.  Keep the floor dry. Clean up any water that spills on the floor as soon as it happens.  Remove soap buildup in the tub or shower regularly.  Attach bath mats securely with double-sided non-slip rug tape.  Do not have throw rugs and other things on the floor that can make you trip. What can I do in the bedroom?  Use night lights.  Make sure that you have a light by your bed that is easy to reach.  Do not use any sheets or blankets that are too big for your bed. They should not hang down onto the floor.  Have a firm chair that has side arms. You can use this for support while you get dressed.  Do not have throw rugs and other things on the floor that can make you trip. What can I do in the kitchen?  Clean up any spills right away.  Avoid walking on wet floors.  Keep items that you use a lot in easy-to-reach places.  If you need to reach something above you, use a strong step stool that has a grab bar.  Keep electrical cords out of the way.  Do not use floor polish or wax that makes floors slippery. If you must use wax, use non-skid floor wax.  Do not have throw rugs and other things on the floor that can make you trip. What can I do with my stairs?  Do not leave any items on the stairs.  Make sure that there are handrails on both sides of the stairs and use them. Fix handrails that are broken or loose. Make sure that handrails are as long as the stairways.  Check any carpeting to make sure that it is firmly attached to the stairs. Fix any carpet that is loose or worn.  Avoid having throw rugs at the top or bottom of the stairs. If you do have throw rugs, attach them to the floor with carpet tape.  Make sure that you have a light switch at the top of the stairs and the bottom of the stairs. If you do not have them, ask someone to add them for you. What else can I do to help prevent falls?  Wear shoes  that:  Do not have high heels.  Have rubber bottoms.  Are comfortable and fit you well.  Are closed at the toe. Do not wear sandals.  If you use a stepladder:  Make sure that it is fully opened. Do not climb a closed stepladder.  Make sure that both sides of the stepladder are locked into place.  Ask someone to hold it for you, if possible.  Clearly mark and make sure that you can see:  Any grab bars or handrails.  First and last steps.  Where the edge of each step is.  Use tools that help you move around (mobility aids) if they are needed. These include:  Canes.  Walkers.  Scooters.  Crutches.  Turn on the lights when you go into a dark area. Replace any light bulbs as soon as they burn out.  Set up your furniture so you have a clear path. Avoid moving your furniture around.  If any of your floors are uneven, fix them.  If there are any pets around you, be aware of where they are.  Review your medicines with your doctor. Some medicines can make you feel dizzy. This can increase your chance of falling. Ask your doctor what other things that you can do to help prevent falls. This information is not intended to replace advice given to you by your health care provider. Make sure you discuss any questions you have with your health care provider. Document Released: 01/03/2009 Document Revised: 08/15/2015 Document Reviewed: 04/13/2014 Elsevier Interactive Patient Education  2017 [employer].

## 2017-08-20 NOTE — Progress Notes (Signed)
Subjective:   Justin Griffith is a 77 y.o. male who presents for Medicare Annual/Subsequent preventive examination.  Review of Systems:   Cardiac Risk Factors include: male gender;hypertension;advanced age (>44men, >104 women);dyslipidemia;diabetes mellitus;obesity (BMI >30kg/m2);smoking/ tobacco exposure     Objective:    Vitals: BP (!) 146/82 (BP Location: Left Arm, Patient Position: Sitting)   Pulse 60   Temp 98 F (36.7 C) (Temporal)   Resp 17   Ht 5\' 10"  (1.778 m)   Wt (!) 306 lb 11.2 oz (139.1 kg)   BMI 44.01 kg/m   Body mass index is 44.01 kg/m.  Advanced Directives 08/20/2017 02/12/2016 01/08/2016 09/25/2015 09/04/2015 08/09/2014  Does Patient Have a Medical Advance Directive? No No No No No No  Would patient like information on creating a medical advance directive? Yes (MAU/Ambulatory/Procedural Areas - Information given) No - Patient declined - No - patient declined information No - patient declined information No - patient declined information    Tobacco Social History   Tobacco Use  Smoking Status Former Smoker  . Packs/day: 1.00  . Years: 30.00  . Pack years: 30.00  . Types: Cigarettes  . Last attempt to quit: 03/23/1989  . Years since quitting: 28.4  Smokeless Tobacco Never Used     Counseling given: Not Answered   Clinical Intake:  Pre-visit preparation completed: Yes  Pain : No/denies pain     Nutritional Status: BMI > 30  Obese Nutritional Risks: None Diabetes: Yes CBG done?: No Did pt. bring in CBG monitor from home?: No  How often do you need to have someone help you when you read instructions, pamphlets, or other written materials from your doctor or pharmacy?: 1 - Never What is the last grade level you completed in school?: GED in [employer]   Interpreter Needed?: No  Information entered by :: Tiffany Hill,LPN   Past Medical History:  Diagnosis Date  . Arthritis   . Asthma 2000  . Back pain   . Colon polyp 2012  . COPD (chronic  obstructive pulmonary disease) ([place])   . Diabetes mellitus without complication ([place])   . Emphysema of lung ([place])   . Hernia 2000  . Hyperlipidemia   . Hypertension   . Personal history of malignant neoplasm of large intestine   . Personal history of tobacco use, presenting hazards to health   . Sleep apnea   . Special screening for malignant neoplasms, colon    Past Surgical History:  Procedure Laterality Date  . CARDIAC CATHETERIZATION Left 09/25/2015   Procedure: Left Heart Cath and Coronary Angiography;  Surgeon: Yolonda Kida, MD;  Location: [facility];  Service: Cardiovascular;  Laterality: Left;  . CHOLECYSTECTOMY  1970  . COLON SURGERY  07-16-99   sigmoid colon resection with primary anastomosis, chemotherapy for metastatic disease  . COLONOSCOPY  2001, 2012   Dr Bary Castilla, tubular adenoma of the cecum and ascending colon in 2012.  Marland Kitchen COLONOSCOPY WITH PROPOFOL N/A 02/12/2016   Procedure: COLONOSCOPY WITH PROPOFOL;  Surgeon: Robert Bellow, MD;  Location: [facility];  Service: Endoscopy;  Laterality: N/A;  . ESOPHAGOGASTRODUODENOSCOPY (EGD) WITH PROPOFOL N/A 02/12/2016   Procedure: ESOPHAGOGASTRODUODENOSCOPY (EGD) WITH PROPOFOL;  Surgeon: Robert Bellow, MD;  Location: [facility] ENDOSCOPY;  Service: Endoscopy;  Laterality: N/A;  . HERNIA REPAIR Right    right inguinal hernia repair  . JOINT REPLACEMENT Bilateral    knee replacement  . KNEE SURGERY Bilateral 2010  . MYRINGOTOMY WITH TUBE PLACEMENT Bilateral 08/16/2014  Procedure: MYRINGOTOMY WITH TUBE PLACEMENT;  Surgeon: Carloyn Manner, MD;  Location: [facility] ORS;  Service: ENT;  Laterality: Bilateral;  . PERIPHERAL VASCULAR CATHETERIZATION Right 09/04/2015   Procedure: Lower Extremity Angiography;  Surgeon: Katha Cabal, MD;  Location: [facility];  Service: Cardiovascular;  Laterality: Right;  . PERIPHERAL VASCULAR CATHETERIZATION  09/04/2015   Procedure: Lower Extremity Intervention;  Surgeon:  Katha Cabal, MD;  Location: [facility];  Service: Cardiovascular;;  . TONSILLECTOMY    . TYMPANOSTOMY TUBE PLACEMENT     Family History  Problem Relation Age of Onset  . Diabetes Father   . Esophageal cancer Mother   . Alzheimer's disease Paternal Uncle    Social History   Socioeconomic History  . Marital status: Married    Spouse name: Not on file  . Number of children: Not on file  . Years of education: Not on file  . Highest education level: Not on file  Occupational History  . Not on file  Social Needs  . Financial resource strain: Not hard at all  . Food insecurity:    Worry: Never true    Inability: Never true  . Transportation needs:    Medical: No    Non-medical: No  Tobacco Use  . Smoking status: Former Smoker    Packs/day: 1.00    Years: 30.00    Pack years: 30.00    Types: Cigarettes    Last attempt to quit: 03/23/1989    Years since quitting: 28.4  . Smokeless tobacco: Never Used  Substance and Sexual Activity  . Alcohol use: No    Alcohol/week: 0.0 oz  . Drug use: No  . Sexual activity: Not on file  Lifestyle  . Physical activity:    Days per week: 0 days    Minutes per session: 0 min  . Stress: Not at all  Relationships  . Social connections:    Talks on phone: More than three times a week    Gets together: More than three times a week    Attends religious service: More than 4 times per year    Active member of club or organization: Yes    Attends meetings of clubs or organizations: More than 4 times per year    Relationship status: Married  Other Topics Concern  . Not on file  Social History Narrative   [employer]     Outpatient Encounter Medications as of 08/20/2017  Medication Sig  . carvedilol (COREG) 25 MG tablet Take 1 tablet (25 mg total) by mouth 2 (two) times daily with a meal.  . cloNIDine (CATAPRES) 0.1 MG tablet Take 1 tablet (0.1 mg total) by mouth 2 (two) times daily.  . Dulaglutide 1.5 MG/0.5ML SOPN Inject  into the skin.  . fenofibrate (TRICOR) 145 MG tablet Take 1 tablet (145 mg total) by mouth at bedtime.  . fluticasone furoate-vilanterol (BREO ELLIPTA) 100-25 MCG/INH AEPB Inhale 1 puff into the lungs daily.  Marland Kitchen HUMALOG KWIKPEN 100 UNIT/ML KiwkPen INJECT 10 UNITS (0.1MLS) SUBCUTANEOUSLY THREE TIMES A DAY  . Insulin Glargine (TOUJEO SOLOSTAR) 300 UNIT/ML SOPN Inject 40-60 Units into the skin daily.  . Insulin Syringe-Needle U-100 25G X 5/8" 1 ML MISC Use as directed.  . naproxen (NAPROSYN) 500 MG tablet Take 500 mg by mouth 2 (two) times daily with a meal.  . Olmesartan-Amlodipine-HCTZ 40-10-25 MG TABS Take 1 tablet by mouth daily.  . pantoprazole (PROTONIX) 20 MG tablet Take 1 tablet (20 mg total) by  mouth daily.  . rosuvastatin (CRESTOR) 40 MG tablet Take 1 tablet (40 mg total) by mouth at bedtime.  . valACYclovir (VALTREX) 500 MG tablet Take 1 tablet (500 mg total) 2 (two) times daily by mouth.  Marland Kitchen albuterol (PROVENTIL HFA) 108 (90 Base) MCG/ACT inhaler Inhale into the lungs.  Marland Kitchen aspirin EC 81 MG tablet Take 81 mg by mouth daily.   Marland Kitchen nystatin (MYCOSTATIN/NYSTOP) powder Apply 4 (four) times daily topically. (Patient not taking: Reported on 08/20/2017)   No facility-administered encounter medications on file as of 08/20/2017.     Activities of Daily Living In your present state of health, do you have any difficulty performing the following activities: 08/20/2017  Hearing? Y  Comment bilateral hearings aids through [place]? N  Difficulty concentrating or making decisions? N  Walking or climbing stairs? N  Dressing or bathing? N  Doing errands, shopping? N  Preparing Food and eating ? N  Using the Toilet? N  In the past six months, have you accidently leaked urine? N  Do you have problems with loss of bowel control? N  Managing your Medications? N  Managing your Finances? N  Housekeeping or managing your Housekeeping? N  Some recent data might be hidden    Patient Care  Team: Guadalupe Maple, MD as PCP - General (Family Medicine) Guadalupe Maple, MD (Family Medicine) Byrnett, Forest Gleason, MD (General Surgery) Carloyn Manner, MD as Referring Physician (Otolaryngology) Schnier, Dolores Lory, MD (Vascular Surgery) Abisogun, Domenica Reamer, MD as Consulting Physician (Internal Medicine)   Assessment:   This is a routine wellness examination for Kees.  Exercise Activities and Dietary recommendations Current Exercise Habits: The patient does not participate in regular exercise at present, Exercise limited by: None identified  Goals    . DIET - INCREASE WATER INTAKE     Recommend drinking at least 6-8 glasses of water a day        Fall Risk Fall Risk  08/20/2017 04/29/2017 03/04/2017 02/02/2017 08/31/2016  Falls in the past year? No No No No No   Is the patient's home free of loose throw rugs in walkways, pet beds, electrical cords, etc?   yes      Grab bars in the bathroom? no      Handrails on the stairs?   no stairs       Adequate lighting?   yes  Timed Get Up and Go Performed: Completed in 9 seconds with no use of assistive devices, steady gait. No intervention needed at this time.   Depression Screen PHQ 2/9 Scores 08/20/2017 04/29/2017 02/02/2017 07/23/2016  PHQ - 2 Score 0 0 0 0  PHQ- 9 Score - - - -    Cognitive Function     6CIT Screen 08/20/2017  What Year? 0 points  What month? 0 points  What time? 0 points  Count back from 20 0 points  Months in reverse 0 points  Repeat phrase 0 points  Total Score 0    Immunization History  Administered Date(s) Administered  . Influenza, High Dose Seasonal PF 02/02/2017  . Influenza,inj,Quad PF,6+ Mos 01/16/2015  . Influenza-Unspecified 01/20/2016  . Pneumococcal Conjugate-13 07/05/2014  . Pneumococcal Polysaccharide-23 01/30/2016  . Pneumococcal-Unspecified 03/23/2000, 12/09/2004  . Td 01/19/2008  . Zoster 07/28/2011    Qualifies for Shingles Vaccine? Yes, discussed shingrix vaccine    Screening Tests Health Maintenance  Topic Date Due  . OPHTHALMOLOGY EXAM  03/05/2017  . HEMOGLOBIN A1C  08/02/2017  . INFLUENZA VACCINE  10/21/2017  . TETANUS/TDAP  01/18/2018  . FOOT EXAM  06/25/2018  . PNA vac Low Risk Adult  Completed   Cancer Screenings: Lung: Low Dose CT Chest recommended if Age 34-80 years, 30 pack-year currently smoking OR have quit w/in 15years. Patient does not qualify. Colorectal: not indicated  Additional Screenings:  Hepatitis C Screening: not indicated       Plan:    I have personally reviewed and addressed the Medicare Annual Wellness questionnaire and have noted the following in the patient's chart:  A. Medical and social history B. Use of alcohol, tobacco or illicit drugs  C. Current medications and supplements D. Functional ability and status E.  Nutritional status F.  Physical activity G. Advance directives H. List of other physicians I.  Hospitalizations, surgeries, and ER visits in previous 12 months J.  [place] such as hearing and vision if needed, cognitive and depression L. Referrals and appointments   In addition, I have reviewed and discussed with patient certain preventive protocols, quality metrics, and best practice recommendations. A written personalized care plan for preventive services as well as general preventive health recommendations were provided to patient.   Signed,  Tyler Aas, LPN Nurse Health Advisor   Nurse Notes:none

## 2017-09-01 DIAGNOSIS — E1142 Type 2 diabetes mellitus with diabetic polyneuropathy: Secondary | ICD-10-CM | POA: Diagnosis not present

## 2017-09-01 DIAGNOSIS — E1159 Type 2 diabetes mellitus with other circulatory complications: Secondary | ICD-10-CM | POA: Diagnosis not present

## 2017-09-01 DIAGNOSIS — I10 Essential (primary) hypertension: Secondary | ICD-10-CM | POA: Diagnosis not present

## 2017-09-01 DIAGNOSIS — Z794 Long term (current) use of insulin: Secondary | ICD-10-CM | POA: Diagnosis not present

## 2017-09-01 DIAGNOSIS — E785 Hyperlipidemia, unspecified: Secondary | ICD-10-CM | POA: Diagnosis not present

## 2017-09-01 DIAGNOSIS — E1169 Type 2 diabetes mellitus with other specified complication: Secondary | ICD-10-CM | POA: Diagnosis not present

## 2017-09-07 ENCOUNTER — Ambulatory Visit (INDEPENDENT_AMBULATORY_CARE_PROVIDER_SITE_OTHER): Payer: Medicare Other | Admitting: Family Medicine

## 2017-09-07 ENCOUNTER — Encounter: Payer: Self-pay | Admitting: Family Medicine

## 2017-09-07 VITALS — BP 150/70 | HR 62 | Ht 73.0 in | Wt 307.0 lb

## 2017-09-07 DIAGNOSIS — N400 Enlarged prostate without lower urinary tract symptoms: Secondary | ICD-10-CM

## 2017-09-07 DIAGNOSIS — Z7189 Other specified counseling: Secondary | ICD-10-CM

## 2017-09-07 DIAGNOSIS — E119 Type 2 diabetes mellitus without complications: Secondary | ICD-10-CM | POA: Diagnosis not present

## 2017-09-07 DIAGNOSIS — E785 Hyperlipidemia, unspecified: Secondary | ICD-10-CM

## 2017-09-07 DIAGNOSIS — I10 Essential (primary) hypertension: Secondary | ICD-10-CM | POA: Diagnosis not present

## 2017-09-07 DIAGNOSIS — E669 Obesity, unspecified: Secondary | ICD-10-CM | POA: Insufficient documentation

## 2017-09-07 MED ORDER — CARVEDILOL 25 MG PO TABS: 25.0000 mg | ORAL_TABLET | Freq: Two times a day (BID) | ORAL | 4 refills | Status: DC

## 2017-09-07 MED ORDER — ROSUVASTATIN CALCIUM 40 MG PO TABS: 40.0000 mg | ORAL_TABLET | Freq: Every day | ORAL | 4 refills | Status: DC

## 2017-09-07 MED ORDER — FENOFIBRATE 145 MG PO TABS: 145.0000 mg | ORAL_TABLET | Freq: Every day | ORAL | 4 refills | Status: DC

## 2017-09-07 MED ORDER — OLMESARTAN-AMLODIPINE-HCTZ 40-10-25 MG PO TABS: 1.0000 | ORAL_TABLET | Freq: Every day | ORAL | 4 refills | Status: DC

## 2017-09-07 MED ORDER — CLONIDINE HCL 0.1 MG PO TABS: 0.1000 mg | ORAL_TABLET | Freq: Two times a day (BID) | ORAL | 4 refills | Status: DC

## 2017-09-07 NOTE — Assessment & Plan Note (Signed)
Discussed importance of weight loss for controlling conditions.

## 2017-09-07 NOTE — Progress Notes (Signed)
BP (!) 150/70 (BP Location: Left Arm)   Pulse 62   Ht 6\' 1"  (1.854 m)   Wt (!) 307 lb (139.3 kg)   SpO2 98%   BMI 40.50 kg/m    Subjective:    Patient ID: Justin Griffith, male    DOB: 08/14/1940, 77 y.o.   MRN: [id number]  HPI: Justin Griffith is a 77 y.o. male  Chief Complaint  Patient presents with  . Annual Exam    Saw [place]    Patient with history of degenerative disc disease and facet disease of his lumbar spine this is per x-rays and patient reports from 2016.  Spine seems to be getting worse with worsening pain.  Patient's been taking aspirin and Tylenol does not seem to work for his back pain. Reviewed blood pressure patient's blood pressure elevated here on repeat 150/70 on chart review patient's blood pressure readings have been pretty consistently about the same.  Patient taking his medicines faithfully without fail. Reflux and cholesterol doing well no complaints from medications. Patient also follow-up hypertension home checking generally gets in the 130s Relevant past medical, surgical, family and social history reviewed and updated as indicated. Interim medical history since our last visit reviewed. Allergies and medications reviewed and updated.  Review of Systems  Constitutional: Negative.   HENT: Negative.   Eyes: Negative.   Respiratory: Negative.   Cardiovascular: Negative.   Gastrointestinal: Negative.   Endocrine: Negative.   Genitourinary: Negative.   Musculoskeletal: Negative.   Skin: Negative.   Allergic/Immunologic: Negative.   Neurological: Negative.   Hematological: Negative.   Psychiatric/Behavioral: Negative.     Per HPI unless specifically indicated above     Objective:    BP (!) 150/70 (BP Location: Left Arm)   Pulse 62   Ht 6\' 1"  (1.854 m)   Wt (!) 307 lb (139.3 kg)   SpO2 98%   BMI 40.50 kg/m   Wt Readings from Last 3 Encounters:  09/07/17 (!) 307 lb (139.3 kg)  08/20/17 (!) 306 lb 11.2 oz (139.1 kg)  04/29/17 (!) 310 lb  (140.6 kg)    Physical Exam  Constitutional: He is oriented to person, place, and time. He appears well-developed and well-nourished.  HENT:  Head: Normocephalic and atraumatic.  Right Ear: External ear normal.  Left Ear: External ear normal.  Eyes: Pupils are equal, round, and reactive to light. Conjunctivae and EOM are normal.  Neck: Normal range of motion. Neck supple.  Cardiovascular: Normal rate, regular rhythm, normal heart sounds and intact distal pulses.  Pulmonary/Chest: Effort normal and breath sounds normal.  Abdominal: Soft. Bowel sounds are normal. There is no splenomegaly or hepatomegaly.  Genitourinary: Rectum normal and penis normal.  Genitourinary Comments: BPH changes  Musculoskeletal: Normal range of motion.  Neurological: He is alert and oriented to person, place, and time. He has normal reflexes.  Skin: No rash noted. No erythema.  Psychiatric: He has a normal mood and affect. His behavior is normal. Judgment and thought content normal.    Results for orders placed or performed in visit on 62/13/08  Basic metabolic panel  Result Value Ref Range   Glucose 172 (H) 65 - 99 mg/dL   BUN 21 8 - 27 mg/dL   Creatinine, Ser 1.30 (H) 0.76 - 1.27 mg/dL   GFR calc non Af Amer 53 (L) >59 mL/min/1.73   GFR calc Af Amer 61 >59 mL/min/1.73   BUN/Creatinine Ratio 16 10 - 24   Sodium 138 134 -  144 mmol/L   Potassium 4.7 3.5 - 5.2 mmol/L   Chloride 99 96 - 106 mmol/L   CO2 26 20 - 29 mmol/L   Calcium 10.6 (H) 8.6 - 10.2 mg/dL  LP+ALT+AST Piccolo, Waived  Result Value Ref Range   ALT (SGPT) Piccolo, Waived 29 10 - 47 U/L   AST (SGOT) Piccolo, Waived 32 11 - 38 U/L   Cholesterol Piccolo, Waived 89 <200 mg/dL   HDL Chol Piccolo, Waived 26 (L) >59 mg/dL   Triglycerides Piccolo,Waived 262 (H) <150 mg/dL   Chol/HDL Ratio Piccolo,Waive 3.4 mg/dL   LDL Chol Calc Piccolo Waived 10 <100 mg/dL   VLDL Chol Calc Piccolo,Waive 52 (H) <30 mg/dL  Urinalysis, Routine w reflex  microscopic  Result Value Ref Range   Specific Gravity, UA 1.015 1.005 - 1.030   pH, UA 6.0 5.0 - 7.5   Color, UA Yellow Yellow   Appearance Ur Clear Clear   Leukocytes, UA Negative Negative   Protein, UA Negative Negative/Trace   Glucose, UA Negative Negative   Ketones, UA Negative Negative   RBC, UA Negative Negative   Bilirubin, UA Negative Negative   Urobilinogen, Ur 1.0 0.2 - 1.0 mg/dL   Nitrite, UA Negative Negative  Bayer DCA Hb A1c Waived  Result Value Ref Range   HB A1C (BAYER DCA - WAIVED) 6.9 <7.0 %      Assessment & Plan:   Problem List Items Addressed This Visit      Cardiovascular and Mediastinum   Hypertension - Primary    Poor control patient will do better with diet starting to lose weight get serious.  We will follow-up if not coming down.      Relevant Medications   rosuvastatin (CRESTOR) 40 MG tablet   Olmesartan-amLODIPine-HCTZ 40-10-25 MG TABS   fenofibrate (TRICOR) 145 MG tablet   cloNIDine (CATAPRES) 0.1 MG tablet   carvedilol (COREG) 25 MG tablet   Other Relevant Orders   CBC with Differential/Platelet     Endocrine   Diabetes mellitus without complication (H[facility])    Followed by endocrinology      Relevant Medications   rosuvastatin (CRESTOR) 40 MG tablet   Olmesartan-amLODIPine-HCTZ 40-10-25 MG TABS     Genitourinary   BPH (benign prostatic hyperplasia)   Relevant Orders   PSA     Other   Hyperlipidemia    The current medical regimen is effective;  continue present plan and medications.       Relevant Medications   rosuvastatin (CRESTOR) 40 MG tablet   Olmesartan-amLODIPine-HCTZ 40-10-25 MG TABS   fenofibrate (TRICOR) 145 MG tablet   cloNIDine (CATAPRES) 0.1 MG tablet   carvedilol (COREG) 25 MG tablet   Advanced care planning/counseling discussion    A voluntary discussion about advanced care planning including explanation and discussion of advanced directives was extentively discussed with the patient.  Explained about the  healthcare proxy and living will was reviewed and packet with forms with expiration of how to fill them out was given.  Time spent: Encounter 16+ min individuals present: Patient      Morbid obesity ([place])    Discussed importance of weight loss for controlling conditions.          Follow up plan: Return in about 6 months (around 03/09/2018) for BMP,  Lipids, ALT, AST.

## 2017-09-07 NOTE — Assessment & Plan Note (Signed)
The current medical regimen is effective;  continue present plan and medications.  

## 2017-09-07 NOTE — Assessment & Plan Note (Signed)
Poor control patient will do better with diet starting to lose weight get serious.  We will follow-up if not coming down.

## 2017-09-07 NOTE — Assessment & Plan Note (Signed)
Followed by endocrinology 

## 2017-09-07 NOTE — Assessment & Plan Note (Signed)
A voluntary discussion about advanced care planning including explanation and discussion of advanced directives was extentively discussed with the patient.  Explained about the healthcare proxy and living will was reviewed and packet with forms with expiration of how to fill them out was given.  Time spent: Encounter 16+ min individuals present: Patient 

## 2017-09-08 DIAGNOSIS — E1169 Type 2 diabetes mellitus with other specified complication: Secondary | ICD-10-CM | POA: Diagnosis not present

## 2017-09-08 DIAGNOSIS — I10 Essential (primary) hypertension: Secondary | ICD-10-CM | POA: Diagnosis not present

## 2017-09-08 DIAGNOSIS — E1159 Type 2 diabetes mellitus with other circulatory complications: Secondary | ICD-10-CM | POA: Diagnosis not present

## 2017-09-08 DIAGNOSIS — E785 Hyperlipidemia, unspecified: Secondary | ICD-10-CM | POA: Diagnosis not present

## 2017-09-08 DIAGNOSIS — Z794 Long term (current) use of insulin: Secondary | ICD-10-CM | POA: Diagnosis not present

## 2017-09-08 DIAGNOSIS — E1142 Type 2 diabetes mellitus with diabetic polyneuropathy: Secondary | ICD-10-CM | POA: Diagnosis not present

## 2017-09-29 DIAGNOSIS — E1140 Type 2 diabetes mellitus with diabetic neuropathy, unspecified: Secondary | ICD-10-CM | POA: Diagnosis not present

## 2017-09-29 DIAGNOSIS — L851 Acquired keratosis [keratoderma] palmaris et plantaris: Secondary | ICD-10-CM | POA: Diagnosis not present

## 2017-09-29 DIAGNOSIS — Z794 Long term (current) use of insulin: Secondary | ICD-10-CM | POA: Diagnosis not present

## 2017-09-29 DIAGNOSIS — B351 Tinea unguium: Secondary | ICD-10-CM | POA: Diagnosis not present

## 2017-10-07 DIAGNOSIS — R0602 Shortness of breath: Secondary | ICD-10-CM | POA: Diagnosis not present

## 2017-10-07 DIAGNOSIS — J449 Chronic obstructive pulmonary disease, unspecified: Secondary | ICD-10-CM | POA: Diagnosis not present

## 2017-10-07 DIAGNOSIS — G4733 Obstructive sleep apnea (adult) (pediatric): Secondary | ICD-10-CM | POA: Diagnosis not present

## 2017-10-11 DIAGNOSIS — H7200 Central perforation of tympanic membrane, unspecified ear: Secondary | ICD-10-CM | POA: Diagnosis not present

## 2017-10-11 DIAGNOSIS — H7101 Cholesteatoma of attic, right ear: Secondary | ICD-10-CM | POA: Diagnosis not present

## 2017-10-19 DIAGNOSIS — R0602 Shortness of breath: Secondary | ICD-10-CM | POA: Diagnosis not present

## 2017-10-19 DIAGNOSIS — G4733 Obstructive sleep apnea (adult) (pediatric): Secondary | ICD-10-CM | POA: Diagnosis not present

## 2017-10-19 DIAGNOSIS — I209 Angina pectoris, unspecified: Secondary | ICD-10-CM | POA: Diagnosis not present

## 2017-10-19 DIAGNOSIS — I739 Peripheral vascular disease, unspecified: Secondary | ICD-10-CM | POA: Diagnosis not present

## 2017-10-19 DIAGNOSIS — I10 Essential (primary) hypertension: Secondary | ICD-10-CM | POA: Diagnosis not present

## 2017-10-19 DIAGNOSIS — E669 Obesity, unspecified: Secondary | ICD-10-CM | POA: Diagnosis not present

## 2017-10-19 DIAGNOSIS — Z87891 Personal history of nicotine dependence: Secondary | ICD-10-CM | POA: Diagnosis not present

## 2017-10-22 ENCOUNTER — Other Ambulatory Visit: Payer: Self-pay

## 2017-10-22 DIAGNOSIS — R0602 Shortness of breath: Secondary | ICD-10-CM | POA: Diagnosis not present

## 2017-10-22 MED ORDER — CLOTRIMAZOLE-BETAMETHASONE 1-0.05 % EX LOTN: TOPICAL_LOTION | Freq: Two times a day (BID) | CUTANEOUS | 1 refills | Status: DC

## 2017-10-22 NOTE — Telephone Encounter (Signed)
Copied from [place] [id number]. Topic: General - Other >> Oct 22, 2017  8:51 AM Oneta Rack wrote: Relation to pt: self  Call back number: [phone] Pharmacy: [place], [place] - [address] [phone] (Phone) [phone] (Fax)   Reason for call:  Patient requesting clotrimazole-betamethasone, informed please allow 48 to 72 hour turn around time, patient states he was hoping it would be filled today, please advise

## 2017-10-22 NOTE — Telephone Encounter (Signed)
Patient notified

## 2017-12-13 DIAGNOSIS — H7101 Cholesteatoma of attic, right ear: Secondary | ICD-10-CM | POA: Diagnosis not present

## 2017-12-30 DIAGNOSIS — L851 Acquired keratosis [keratoderma] palmaris et plantaris: Secondary | ICD-10-CM | POA: Diagnosis not present

## 2017-12-30 DIAGNOSIS — B351 Tinea unguium: Secondary | ICD-10-CM | POA: Diagnosis not present

## 2017-12-30 DIAGNOSIS — Z794 Long term (current) use of insulin: Secondary | ICD-10-CM | POA: Diagnosis not present

## 2017-12-30 DIAGNOSIS — E1140 Type 2 diabetes mellitus with diabetic neuropathy, unspecified: Secondary | ICD-10-CM | POA: Diagnosis not present

## 2018-01-11 DIAGNOSIS — E1169 Type 2 diabetes mellitus with other specified complication: Secondary | ICD-10-CM | POA: Diagnosis not present

## 2018-01-11 DIAGNOSIS — E785 Hyperlipidemia, unspecified: Secondary | ICD-10-CM | POA: Diagnosis not present

## 2018-01-11 DIAGNOSIS — E1142 Type 2 diabetes mellitus with diabetic polyneuropathy: Secondary | ICD-10-CM | POA: Diagnosis not present

## 2018-01-11 DIAGNOSIS — E1159 Type 2 diabetes mellitus with other circulatory complications: Secondary | ICD-10-CM | POA: Diagnosis not present

## 2018-01-11 DIAGNOSIS — Z794 Long term (current) use of insulin: Secondary | ICD-10-CM | POA: Diagnosis not present

## 2018-01-11 DIAGNOSIS — I10 Essential (primary) hypertension: Secondary | ICD-10-CM | POA: Diagnosis not present

## 2018-01-11 LAB — HEMOGLOBIN A1C: Hemoglobin A1C: 7.7

## 2018-02-15 DIAGNOSIS — Z23 Encounter for immunization: Secondary | ICD-10-CM | POA: Diagnosis not present

## 2018-02-21 ENCOUNTER — Telehealth: Payer: Self-pay | Admitting: Family Medicine

## 2018-02-21 MED ORDER — VALACYCLOVIR HCL 500 MG PO TABS: 500.0000 mg | ORAL_TABLET | Freq: Two times a day (BID) | ORAL | 3 refills | Status: DC

## 2018-02-21 NOTE — Telephone Encounter (Signed)
Justin Griffith came in stating that his Valacycovir  500 Mg has expired and wants a refill. Pt uses [facility] mail order pharmacy.

## 2018-03-07 DIAGNOSIS — H6060 Unspecified chronic otitis externa, unspecified ear: Secondary | ICD-10-CM | POA: Diagnosis not present

## 2018-03-07 DIAGNOSIS — H6982 Other specified disorders of Eustachian tube, left ear: Secondary | ICD-10-CM | POA: Diagnosis not present

## 2018-03-07 DIAGNOSIS — H7101 Cholesteatoma of attic, right ear: Secondary | ICD-10-CM | POA: Diagnosis not present

## 2018-03-08 DIAGNOSIS — R0602 Shortness of breath: Secondary | ICD-10-CM | POA: Diagnosis not present

## 2018-03-09 ENCOUNTER — Ambulatory Visit (INDEPENDENT_AMBULATORY_CARE_PROVIDER_SITE_OTHER): Payer: Medicare Other | Admitting: Family Medicine

## 2018-03-09 DIAGNOSIS — Z5329 Procedure and treatment not carried out because of patient's decision for other reasons: Secondary | ICD-10-CM

## 2018-03-10 NOTE — Progress Notes (Signed)
No show

## 2018-05-10 ENCOUNTER — Telehealth: Payer: Self-pay | Admitting: Family Medicine

## 2018-05-10 NOTE — Telephone Encounter (Signed)
Copied from [place] [id number]. Topic: Quick Communication - Rx Refill/Question >> May 10, 2018 10:11 AM Sheppard Coil, [employer] wrote: Medication: HUMALOG KWIKPEN 100 UNIT/ML KiwkPen  Pt would like to know if there are any samples he can have, he has run out.

## 2018-05-10 NOTE — Telephone Encounter (Signed)
Pt notified that samples were ready for pick up

## 2018-05-26 DIAGNOSIS — Z794 Long term (current) use of insulin: Secondary | ICD-10-CM | POA: Diagnosis not present

## 2018-05-26 DIAGNOSIS — I10 Essential (primary) hypertension: Secondary | ICD-10-CM | POA: Diagnosis not present

## 2018-05-26 DIAGNOSIS — E1159 Type 2 diabetes mellitus with other circulatory complications: Secondary | ICD-10-CM | POA: Diagnosis not present

## 2018-05-26 DIAGNOSIS — E785 Hyperlipidemia, unspecified: Secondary | ICD-10-CM | POA: Diagnosis not present

## 2018-05-26 DIAGNOSIS — E1142 Type 2 diabetes mellitus with diabetic polyneuropathy: Secondary | ICD-10-CM | POA: Diagnosis not present

## 2018-05-26 DIAGNOSIS — E1169 Type 2 diabetes mellitus with other specified complication: Secondary | ICD-10-CM | POA: Diagnosis not present

## 2018-06-01 DIAGNOSIS — Z6841 Body Mass Index (BMI) 40.0 and over, adult: Secondary | ICD-10-CM | POA: Diagnosis not present

## 2018-06-01 DIAGNOSIS — R0602 Shortness of breath: Secondary | ICD-10-CM | POA: Diagnosis not present

## 2018-06-01 DIAGNOSIS — J449 Chronic obstructive pulmonary disease, unspecified: Secondary | ICD-10-CM | POA: Diagnosis not present

## 2018-06-01 DIAGNOSIS — I739 Peripheral vascular disease, unspecified: Secondary | ICD-10-CM | POA: Diagnosis not present

## 2018-06-01 DIAGNOSIS — I10 Essential (primary) hypertension: Secondary | ICD-10-CM | POA: Diagnosis not present

## 2018-06-01 DIAGNOSIS — G4733 Obstructive sleep apnea (adult) (pediatric): Secondary | ICD-10-CM | POA: Diagnosis not present

## 2018-06-01 DIAGNOSIS — R011 Cardiac murmur, unspecified: Secondary | ICD-10-CM | POA: Diagnosis not present

## 2018-06-01 DIAGNOSIS — Z87891 Personal history of nicotine dependence: Secondary | ICD-10-CM | POA: Diagnosis not present

## 2018-06-22 DIAGNOSIS — R0602 Shortness of breath: Secondary | ICD-10-CM | POA: Diagnosis not present

## 2018-06-22 DIAGNOSIS — R011 Cardiac murmur, unspecified: Secondary | ICD-10-CM | POA: Diagnosis not present

## 2018-07-28 DIAGNOSIS — Z6841 Body Mass Index (BMI) 40.0 and over, adult: Secondary | ICD-10-CM | POA: Diagnosis not present

## 2018-07-28 DIAGNOSIS — R011 Cardiac murmur, unspecified: Secondary | ICD-10-CM | POA: Diagnosis not present

## 2018-07-28 DIAGNOSIS — I739 Peripheral vascular disease, unspecified: Secondary | ICD-10-CM | POA: Diagnosis not present

## 2018-07-28 DIAGNOSIS — J449 Chronic obstructive pulmonary disease, unspecified: Secondary | ICD-10-CM | POA: Diagnosis not present

## 2018-07-28 DIAGNOSIS — E669 Obesity, unspecified: Secondary | ICD-10-CM | POA: Diagnosis not present

## 2018-07-28 DIAGNOSIS — Z87891 Personal history of nicotine dependence: Secondary | ICD-10-CM | POA: Diagnosis not present

## 2018-07-28 DIAGNOSIS — I10 Essential (primary) hypertension: Secondary | ICD-10-CM | POA: Diagnosis not present

## 2018-07-28 DIAGNOSIS — I209 Angina pectoris, unspecified: Secondary | ICD-10-CM | POA: Diagnosis not present

## 2018-07-28 DIAGNOSIS — R0602 Shortness of breath: Secondary | ICD-10-CM | POA: Diagnosis not present

## 2018-07-28 DIAGNOSIS — G4733 Obstructive sleep apnea (adult) (pediatric): Secondary | ICD-10-CM | POA: Diagnosis not present

## 2018-08-22 ENCOUNTER — Ambulatory Visit (INDEPENDENT_AMBULATORY_CARE_PROVIDER_SITE_OTHER): Payer: Medicare Other

## 2018-08-22 VITALS — BP 122/62 | Ht 71.0 in | Wt 304.0 lb

## 2018-08-22 DIAGNOSIS — E119 Type 2 diabetes mellitus without complications: Secondary | ICD-10-CM

## 2018-08-22 DIAGNOSIS — Z0000 Encounter for general adult medical examination without abnormal findings: Secondary | ICD-10-CM | POA: Diagnosis not present

## 2018-08-22 NOTE — Progress Notes (Signed)
Subjective:   Justin Griffith is a 78 y.o. male who presents for Medicare Annual/Subsequent preventive examination.  This visit is being conducted via phone call  - after an attmept to do on video chat - due to the COVID-19 pandemic. This patient has given me verbal consent via phone to conduct this visit, patient states they are participating from their home address. Some vital signs may be absent or patient reported.   Patient identification: identified by name, DOB, and current address.    Review of Systems:   Cardiac Risk Factors include: advanced age (>21men, >19 women);diabetes mellitus;hypertension;male gender;dyslipidemia;obesity (BMI >30kg/m2);smoking/ tobacco exposure     Objective:    Vitals: BP 122/62 Comment: patient reported  Ht 5\' 11"  (1.803 m) Comment: patient reported  Wt (!) 304 lb (137.9 kg) Comment: patient reported  BMI 42.40 kg/m   Body mass index is 42.4 kg/m.  Advanced Directives 08/22/2018 08/20/2017 02/12/2016 01/08/2016 09/25/2015 09/04/2015 08/09/2014  Does Patient Have a Medical Advance Directive? No No No No No No No  Would patient like information on creating a medical advance directive? - Yes (MAU/Ambulatory/Procedural Areas - Information given) No - Patient declined - No - patient declined information No - patient declined information No - patient declined information    Tobacco Social History   Tobacco Use  Smoking Status Former Smoker  . Packs/day: 1.00  . Years: 30.00  . Pack years: 30.00  . Types: Cigarettes  . Last attempt to quit: 03/23/1989  . Years since quitting: 29.4  Smokeless Tobacco Never Used     Counseling given: Not Answered   Clinical Intake:  Pre-visit preparation completed: Yes  Pain : No/denies pain     Nutritional Status: BMI > 30  Obese Nutritional Risks: None Diabetes: Yes CBG done?: No Did pt. bring in CBG monitor from home?: No  How often do you need to have someone help you when you read instructions,  pamphlets, or other written materials from your doctor or pharmacy?: 1 - Never What is the last grade level you completed in school?: GED  Nutrition Risk Assessment:  Has the patient had any N/V/D within the last 2 months?  No  Does the patient have any non-healing wounds?  No  Has the patient had any unintentional weight loss or weight gain?  No   Diabetes:  Is the patient diabetic?  Yes  If diabetic, was a CBG obtained today?  No  Did the patient bring in their glucometer from home?  No  How often do you monitor your CBG's?   Financial Strains and Diabetes Management:  Are you having any financial strains with the device, your supplies or your medication? No .  Does the patient want to be seen by Chronic Care Management for management of their diabetes?  Yes  Would the patient like to be referred to a Nutritionist or for Diabetic Management?  No   Diabetic Exams:  Diabetic Eye Exam: patient to schedule exam with Dr.Woodard   Diabetic Foot Exam: Completed 06/24/2017 Pt has been advised about the importance in completing this exam. Interpreter Needed?: No  Information entered by :: Tyler Aas, LPN   Past Medical History:  Diagnosis Date  . Arthritis   . Asthma 2000  . Back pain   . Colon polyp 2012  . COPD (chronic obstructive pulmonary disease) ([place])   . Diabetes mellitus without complication ([place])   . Emphysema of lung ([place])   . Hernia 2000  . Hyperlipidemia   .  Hypertension   . Personal history of malignant neoplasm of large intestine   . Personal history of tobacco use, presenting hazards to health   . Sleep apnea   . Special screening for malignant neoplasms, colon    Past Surgical History:  Procedure Laterality Date  . CARDIAC CATHETERIZATION Left 09/25/2015   Procedure: Left Heart Cath and Coronary Angiography;  Surgeon: Yolonda Kida, MD;  Location: [place] [facility];  Service: Cardiovascular;  Laterality: Left;  . CHOLECYSTECTOMY  1970  . COLON  SURGERY  07-16-99   sigmoid colon resection with primary anastomosis, chemotherapy for metastatic disease  . COLONOSCOPY  2001, 2012   Dr Bary Castilla, tubular adenoma of the cecum and ascending colon in 2012.  Marland Kitchen COLONOSCOPY WITH PROPOFOL N/A 02/12/2016   Procedure: COLONOSCOPY WITH PROPOFOL;  Surgeon: Robert Bellow, MD;  Location: [facility] ENDOSCOPY;  Service: Endoscopy;  Laterality: N/A;  . ESOPHAGOGASTRODUODENOSCOPY (EGD) WITH PROPOFOL N/A 02/12/2016   Procedure: ESOPHAGOGASTRODUODENOSCOPY (EGD) WITH PROPOFOL;  Surgeon: Robert Bellow, MD;  Location: [facility] ENDOSCOPY;  Service: Endoscopy;  Laterality: N/A;  . HERNIA REPAIR Right    right inguinal hernia repair  . JOINT REPLACEMENT Bilateral    knee replacement  . KNEE SURGERY Bilateral 2010  . MYRINGOTOMY WITH TUBE PLACEMENT Bilateral 08/16/2014   Procedure: MYRINGOTOMY WITH TUBE PLACEMENT;  Surgeon: Carloyn Manner, MD;  Location: [facility] ORS;  Service: ENT;  Laterality: Bilateral;  . PERIPHERAL VASCULAR CATHETERIZATION Right 09/04/2015   Procedure: Lower Extremity Angiography;  Surgeon: Katha Cabal, MD;  Location: [place] [facility];  Service: Cardiovascular;  Laterality: Right;  . PERIPHERAL VASCULAR CATHETERIZATION  09/04/2015   Procedure: Lower Extremity Intervention;  Surgeon: Katha Cabal, MD;  Location: [place] [facility];  Service: Cardiovascular;;  . TONSILLECTOMY    . TYMPANOSTOMY TUBE PLACEMENT     Family History  Problem Relation Age of Onset  . Diabetes Father   . Esophageal cancer Mother   . Alzheimer's disease Paternal Uncle    Social History   Socioeconomic History  . Marital status: Married    Spouse name: Not on file  . Number of children: Not on file  . Years of education: Not on file  . Highest education level: GED or equivalent  Occupational History  . Occupation: retired    Comment: Lawyer  . Financial resource strain: Not very hard  . Food insecurity:    Worry: Never true     Inability: Never true  . Transportation needs:    Medical: No    Non-medical: No  Tobacco Use  . Smoking status: Former Smoker    Packs/day: 1.00    Years: 30.00    Pack years: 30.00    Types: Cigarettes    Last attempt to quit: 03/23/1989    Years since quitting: 29.4  . Smokeless tobacco: Never Used  Substance and Sexual Activity  . Alcohol use: No    Alcohol/week: 0.0 standard drinks  . Drug use: No  . Sexual activity: Not on file  Lifestyle  . Physical activity:    Days per week: 0 days    Minutes per session: 0 min  . Stress: Not at all  Relationships  . Social connections:    Talks on phone: More than three times a week    Gets together: More than three times a week    Attends religious service: More than 4 times per year    Active member of club or organization: Yes  Attends meetings of clubs or organizations: More than 4 times per year    Relationship status: Married  Other Topics Concern  . Not on file  Social History Narrative   [employer]     Outpatient Encounter Medications as of 08/22/2018  Medication Sig  . albuterol (PROVENTIL HFA) 108 (90 Base) MCG/ACT inhaler Inhale into the lungs.  . AMLODIPINE BESYLATE PO Take 10 mg by mouth daily.  Marland Kitchen aspirin EC 81 MG tablet Take 81 mg by mouth daily.   . carvedilol (COREG) 25 MG tablet Take 1 tablet (25 mg total) by mouth 2 (two) times daily with a meal.  . cloNIDine (CATAPRES) 0.1 MG tablet Take 1 tablet (0.1 mg total) by mouth 2 (two) times daily.  . clotrimazole-betamethasone (LOTRISONE) lotion Apply topically 2 (two) times daily.  . fluticasone furoate-vilanterol (BREO ELLIPTA) 100-25 MCG/INH AEPB Inhale 1 puff into the lungs daily.  . hydrochlorothiazide (HYDRODIURIL) 25 MG tablet Take 25 mg by mouth daily.  . insulin glargine (LANTUS) 100 UNIT/ML injection Inject into the skin.  Marland Kitchen insulin NPH Human (NOVOLIN N) 100 UNIT/ML injection Inject 60 Units into the skin.  Marland Kitchen losartan (COZAAR) 100 MG tablet Take 100  mg by mouth daily.  . metFORMIN (GLUCOPHAGE) 500 MG tablet Take 1,000 mg by mouth 2 (two) times daily with a meal. Take 2 y mouth 2 times daily  . naproxen (NAPROSYN) 500 MG tablet Take 500 mg by mouth 2 (two) times daily with a meal.  . rosuvastatin (CRESTOR) 40 MG tablet Take 1 tablet (40 mg total) by mouth at bedtime. (Patient taking differently: Take 40 mg by mouth at bedtime. 20 mg once a day)  . tamsulosin (FLOMAX) 0.4 MG CAPS capsule Take 0.4 mg by mouth daily.  . valACYclovir (VALTREX) 500 MG tablet Take 1 tablet (500 mg total) by mouth 2 (two) times daily.  . [DISCONTINUED] Dulaglutide 1.5 MG/0.5ML SOPN Inject into the skin.  . [DISCONTINUED] fenofibrate (TRICOR) 145 MG tablet Take 1 tablet (145 mg total) by mouth at bedtime. (Patient not taking: Reported on 08/22/2018)  . [DISCONTINUED] HUMALOG KWIKPEN 100 UNIT/ML KiwkPen INJECT 10 UNITS (0.1MLS) SUBCUTANEOUSLY THREE TIMES A DAY (Patient not taking: Reported on 08/22/2018)  . [DISCONTINUED] Insulin Glargine (TOUJEO SOLOSTAR) 300 UNIT/ML SOPN Inject 40-60 Units into the skin daily. (Patient not taking: Reported on 08/22/2018)  . [DISCONTINUED] Insulin Syringe-Needle U-100 25G X 5/8" 1 ML MISC Use as directed. (Patient not taking: Reported on 08/22/2018)  . [DISCONTINUED] nystatin (MYCOSTATIN/NYSTOP) powder Apply 4 (four) times daily topically. (Patient not taking: Reported on 08/22/2018)  . [DISCONTINUED] Olmesartan-amLODIPine-HCTZ 40-10-25 MG TABS Take 1 tablet by mouth daily. (Patient not taking: Reported on 08/22/2018)  . [DISCONTINUED] pantoprazole (PROTONIX) 20 MG tablet Take 1 tablet (20 mg total) by mouth daily. (Patient not taking: Reported on 08/22/2018)   No facility-administered encounter medications on file as of 08/22/2018.     Activities of Daily Living In your present state of health, do you have any difficulty performing the following activities: 08/22/2018  Hearing? N  Vision? N  Difficulty concentrating or making decisions? N  Walking  or climbing stairs? N  Dressing or bathing? N  Doing errands, shopping? N  Preparing Food and eating ? N  Using the Toilet? N  In the past six months, have you accidently leaked urine? N  Do you have problems with loss of bowel control? N  Managing your Medications? N  Managing your Finances? N  Housekeeping or managing your Housekeeping? N  Some recent data might be hidden    Patient Care Team: Guadalupe Maple, MD as PCP - General (Family Medicine) Guadalupe Maple, MD (Family Medicine) Byrnett, Forest Gleason, MD (General Surgery) Carloyn Manner, MD as Referring Physician (Otolaryngology) Schnier, Dolores Lory, MD (Vascular Surgery) Abisogun, Domenica Reamer, MD as Consulting Physician (Internal Medicine)   Assessment:   This is a routine wellness examination for Lewi.  Exercise Activities and Dietary recommendations Current Exercise Habits: The patient does not participate in regular exercise at present, Exercise limited by: None identified  Goals    . DIET - INCREASE WATER INTAKE     Recommend drinking at least 6-8 glasses of water a day        Fall Risk Fall Risk  08/22/2018 08/20/2017 04/29/2017 03/04/2017 02/02/2017  Falls in the past year? 0 No No No No   FALL RISK PREVENTION PERTAINING TO THE HOME:  Any stairs in or around the home? No  If so, are there any without handrails? n/a  Home free of loose throw rugs in walkways, pet beds, electrical cords, etc? Yes  Adequate lighting in your home to reduce risk of falls? Yes   ASSISTIVE DEVICES UTILIZED TO PREVENT FALLS:  Life alert? No  Use of a cane, walker or w/c? No  Grab bars in the bathroom? No  Shower chair or bench in shower? No  Elevated toilet seat or a handicapped toilet? No    TIMED UP AND GO:  Unable to perform   Depression Screen PHQ 2/9 Scores 08/22/2018 08/20/2017 04/29/2017 02/02/2017  PHQ - 2 Score 0 0 0 0  PHQ- 9 Score - - - -    Cognitive Function     6CIT Screen 08/22/2018 08/20/2017  What Year?  0 points 0 points  What month? 0 points 0 points  What time? 0 points 0 points  Count back from 20 0 points 0 points  Months in reverse 0 points 0 points  Repeat phrase 0 points 0 points  Total Score 0 0    Immunization History  Administered Date(s) Administered  . Influenza, High Dose Seasonal PF 02/02/2017  . Influenza,inj,Quad PF,6+ Mos 01/16/2015  . Influenza-Unspecified 01/20/2016, 02/15/2018  . Pneumococcal Conjugate-13 07/05/2014  . Pneumococcal Polysaccharide-23 01/30/2016  . Pneumococcal-Unspecified 03/23/2000, 12/09/2004  . Td 01/19/2008  . Tdap 02/20/2018  . Zoster 07/28/2011  . Zoster Recombinat (Shingrix) 08/26/2017, 11/11/2017    Qualifies for Shingles Vaccine? Completed shingrix   Tdap: due, discussed insurance   Flu Vaccine: up to date   Pneumococcal Vaccine: up to date   Screening Tests Health Maintenance  Topic Date Due  . OPHTHALMOLOGY EXAM  03/05/2017  . FOOT EXAM  06/25/2018  . HEMOGLOBIN A1C  07/13/2018  . INFLUENZA VACCINE  10/22/2018  . TETANUS/TDAP  02/21/2028  . PNA vac Low Risk Adult  Completed   Cancer Screenings:  Colorectal Screening: no longer required  Lung Cancer Screening: (Low Dose CT Chest recommended if Age 33-80 years, 30 pack-year currently smoking OR have quit w/in 15years.) does not qualify.    Additional Screening:  Hepatitis C Screening: does not qualify  Dental Screening: Recommended annual dental exams for proper oral hygiene  Community Resource Referral:  CRR required this visit?  No        Plan:  I have personally reviewed and addressed the Medicare Annual Wellness questionnaire and have noted the following in the patient's chart:  A. Medical and social history B. Use of alcohol, tobacco or illicit drugs  C. Current medications and supplements D. Functional ability and status E.  Nutritional status F.  Physical activity G. Advance directives H. List of other physicians I.  Hospitalizations, surgeries,  and ER visits in previous 12 months J.  [place] such as hearing and vision if needed, cognitive and depression L. Referrals and appointments   In addition, I have reviewed and discussed with patient certain preventive protocols, quality metrics, and best practice recommendations. A written personalized care plan for preventive services as well as general preventive health recommendations were provided to patient.   Signed,   Bevelyn Ngo, LPN  05/23/1222 Nurse Health Advisor  Nurse Notes: none

## 2018-08-22 NOTE — Patient Instructions (Signed)
Justin Griffith , Thank you for taking time to come for your Medicare Wellness Visit. I appreciate your ongoing commitment to your health goals. Please review the following plan we discussed and let me know if I can assist you in the future.   Screening recommendations/referrals: Colonoscopy: no longer required  Recommended yearly ophthalmology/optometry visit for glaucoma screening and checkup Recommended yearly dental visit for hygiene and checkup  Vaccinations: Influenza vaccine: up to date  Pneumococcal vaccine: up to date Tdap vaccine: up to date Shingles vaccine: completed series    Advanced directives: please let us know if you have any questions or need help filling this out  Conditions/risks identified: diabetic- Katie from our Chronic Care Management Team will reach out to you to discuss your diabetic medications  Next appointment: follow up in one year for your annual wellness exam.   Preventive Care 79 Years and Older, Male Preventive care refers to lifestyle choices and visits with your health care provider that can promote health and wellness. What does preventive care include?  A yearly physical exam. This is also called an annual well check.  Dental exams once or twice a year.  Routine eye exams. Ask your health care provider how often you should have your eyes checked.  Personal lifestyle choices, including:  Daily care of your teeth and gums.  Regular physical activity.  Eating a healthy diet.  Avoiding tobacco and drug use.  Limiting alcohol use.  Practicing safe sex.  Taking low doses of aspirin every day.  Taking vitamin and mineral supplements as recommended by your health care provider. What happens during an annual well check? The services and screenings done by your health care provider during your annual well check will depend on your age, overall health, lifestyle risk factors, and family history of disease. Counseling  Your health care provider  may ask you questions about your:  Alcohol use.  Tobacco use.  Drug use.  Emotional well-being.  Home and relationship well-being.  Sexual activity.  Eating habits.  History of falls.  Memory and ability to understand (cognition).  Work and work Statistician. Screening  You may have the following tests or measurements:  Height, weight, and BMI.  Blood pressure.  Lipid and cholesterol levels. These may be checked every 5 years, or more frequently if you are over 60 years old.  Skin check.  Lung cancer screening. You may have this screening every year starting at age 35 if you have a 30-pack-year history of smoking and currently smoke or have quit within the past 15 years.  Fecal occult blood test (FOBT) of the stool. You may have this test every year starting at age 34.  Flexible sigmoidoscopy or colonoscopy. You may have a sigmoidoscopy every 5 years or a colonoscopy every 10 years starting at age 79.  Prostate cancer screening. Recommendations will vary depending on your family history and other risks.  Hepatitis C blood test.  Hepatitis B blood test.  Sexually transmitted disease (STD) testing.  Diabetes screening. This is done by checking your blood sugar (glucose) after you have not eaten for a while (fasting). You may have this done every 1-3 years.  Abdominal aortic aneurysm (AAA) screening. You may need this if you are a current or former smoker.  Osteoporosis. You may be screened starting at age 79 if you are at high risk. Talk with your health care provider about your test results, treatment options, and if necessary, the need for more tests. Vaccines  Your health  care provider may recommend certain vaccines, such as:  Influenza vaccine. This is recommended every year.  Tetanus, diphtheria, and acellular pertussis (Tdap, Td) vaccine. You may need a Td booster every 10 years.  Zoster vaccine. You may need this after age 23.  Pneumococcal 13-valent  conjugate (PCV13) vaccine. One dose is recommended after age 79.  Pneumococcal polysaccharide (PPSV23) vaccine. One dose is recommended after age 79. Talk to your health care provider about which screenings and vaccines you need and how often you need them. This information is not intended to replace advice given to you by your health care provider. Make sure you discuss any questions you have with your health care provider. Document Released: 04/05/2015 Document Revised: 11/27/2015 Document Reviewed: 01/08/2015 Elsevier Interactive Patient Education  2017 [place] Prevention in the Home Falls can cause injuries. They can happen to people of all ages. There are many things you can do to make your home safe and to help prevent falls. What can I do on the outside of my home?  Regularly fix the edges of walkways and driveways and fix any cracks.  Remove anything that might make you trip as you walk through a door, such as a raised step or threshold.  Trim any bushes or trees on the path to your home.  Use bright outdoor lighting.  Clear any walking paths of anything that might make someone trip, such as rocks or tools.  Regularly check to see if handrails are loose or broken. Make sure that both sides of any steps have handrails.  Any raised decks and porches should have guardrails on the edges.  Have any leaves, snow, or ice cleared regularly.  Use sand or salt on walking paths during winter.  Clean up any spills in your garage right away. This includes oil or grease spills. What can I do in the bathroom?  Use night lights.  Install grab bars by the toilet and in the tub and shower. Do not use towel bars as grab bars.  Use non-skid mats or decals in the tub or shower.  If you need to sit down in the shower, use a plastic, non-slip stool.  Keep the floor dry. Clean up any water that spills on the floor as soon as it happens.  Remove soap buildup in the tub or  shower regularly.  Attach bath mats securely with double-sided non-slip rug tape.  Do not have throw rugs and other things on the floor that can make you trip. What can I do in the bedroom?  Use night lights.  Make sure that you have a light by your bed that is easy to reach.  Do not use any sheets or blankets that are too big for your bed. They should not hang down onto the floor.  Have a firm chair that has side arms. You can use this for support while you get dressed.  Do not have throw rugs and other things on the floor that can make you trip. What can I do in the kitchen?  Clean up any spills right away.  Avoid walking on wet floors.  Keep items that you use a lot in easy-to-reach places.  If you need to reach something above you, use a strong step stool that has a grab bar.  Keep electrical cords out of the way.  Do not use floor polish or wax that makes floors slippery. If you must use wax, use non-skid floor wax.  Do not have  throw rugs and other things on the floor that can make you trip. What can I do with my stairs?  Do not leave any items on the stairs.  Make sure that there are handrails on both sides of the stairs and use them. Fix handrails that are broken or loose. Make sure that handrails are as long as the stairways.  Check any carpeting to make sure that it is firmly attached to the stairs. Fix any carpet that is loose or worn.  Avoid having throw rugs at the top or bottom of the stairs. If you do have throw rugs, attach them to the floor with carpet tape.  Make sure that you have a light switch at the top of the stairs and the bottom of the stairs. If you do not have them, ask someone to add them for you. What else can I do to help prevent falls?  Wear shoes that:  Do not have high heels.  Have rubber bottoms.  Are comfortable and fit you well.  Are closed at the toe. Do not wear sandals.  If you use a stepladder:  Make sure that it is fully  opened. Do not climb a closed stepladder.  Make sure that both sides of the stepladder are locked into place.  Ask someone to hold it for you, if possible.  Clearly mark and make sure that you can see:  Any grab bars or handrails.  First and last steps.  Where the edge of each step is.  Use tools that help you move around (mobility aids) if they are needed. These include:  Canes.  Walkers.  Scooters.  Crutches.  Turn on the lights when you go into a dark area. Replace any light bulbs as soon as they burn out.  Set up your furniture so you have a clear path. Avoid moving your furniture around.  If any of your floors are uneven, fix them.  If there are any pets around you, be aware of where they are.  Review your medicines with your doctor. Some medicines can make you feel dizzy. This can increase your chance of falling. Ask your doctor what other things that you can do to help prevent falls. This information is not intended to replace advice given to you by your health care provider. Make sure you discuss any questions you have with your health care provider. Document Released: 01/03/2009 Document Revised: 08/15/2015 Document Reviewed: 04/13/2014 Elsevier Interactive Patient Education  2017 [employer].

## 2018-08-26 ENCOUNTER — Telehealth: Payer: Medicare Other

## 2018-08-26 ENCOUNTER — Ambulatory Visit: Payer: Self-pay | Admitting: Pharmacist

## 2018-08-26 NOTE — Chronic Care Management (AMB) (Signed)
  Chronic Care Management   Note  08/26/2018 Name: Justin Griffith MRN: [id number] DOB: [birth year]  Justin Griffith is a 78 y.o. year old male who is a primary care patient of Crissman, Jeannette How, MD. The CCM team was consulted for assistance with chronic disease management and care coordination needs.   Attempted to contact patient to discuss CCM program; left HIPAA compliant message for him to return my call at his convenience.   Follow up plan: - If I do not hear back, will attempt outreach again within the next 7-10 days.   Catie Darnelle Maffucci, PharmD Clinical Pharmacist [facility] [phone]

## 2018-08-31 ENCOUNTER — Telehealth: Payer: Self-pay

## 2018-09-02 ENCOUNTER — Ambulatory Visit (INDEPENDENT_AMBULATORY_CARE_PROVIDER_SITE_OTHER): Payer: Medicare Other | Admitting: Pharmacist

## 2018-09-02 DIAGNOSIS — E119 Type 2 diabetes mellitus without complications: Secondary | ICD-10-CM | POA: Diagnosis not present

## 2018-09-02 NOTE — Patient Instructions (Signed)
Visit Information  Goals Addressed            This Visit's Progress     Patient Stated   . "I don't know much about my diabetes" (pt-stated)       Current Barriers:  Marland Kitchen Knowledge Deficits related to optimal management of diabetes . Uncontrolled diabetes last A1c 8.5%; [facility] ([name]) . Managed on Lantus 60 units QAM and Novolin R 20 units TID with meals; metformin 1000 mg BID o Hx significant GI upset and intolerance with Victoza and Trulicity o Hx allergic rash to [place] o Notes that he was previously on Humalog and it "made my ankles swell" o Notes that Lantus "works better" than Toujeo for him . Lack of knowledge about appropriate prevention and management of hypoglycemia o Episode of hypoglycemia while on the phone today d/t taking Novolin R without eating breakfast . Dietary indiscretions - snacking after supper on cookies most nights   Pharmacist Clinical Goal(s):  Marland Kitchen Over the next 90 days, patient will work with PharmD, primary care provider, and endocrinology to address needs related to improved self-management of diabetes  Interventions: . Comprehensive medication review performed. Marland Kitchen Extensive discussion of optimal management of hypoglycemia as above. Patient verbalized understanding . Advised to AVOID taking Novolin R if he isn't going to eat a meal. Educated on pharmacokinetics of basal vs bolus insulin. Patient inquisitive about if his pancreas still makes insulin - educated that with duration of ~15 years of diabetes, he still may have some endogenous insulin productions, but exogenous insulin is needed to supplement. . Extensive discussion regarding reduced evening snacking to target improved AM fasting blood sugars, to avoid patient feeling the need to correct with rapid acting insulins. Suggested switching to sugar free cookies or air popped popcorn, if it is too difficult to cut snacking altogether   . Educated on goal A1c, goal fasting and goal post-prandial blood glucose  Patient Self Care Activities:  . Patient will continue to take medications as prescribed . Patient will continue to check blood sugar, fasting, 2 hour post prandial, and when feeling unwell.   Initial goal documentation     . "I have a lot of leg swelling" (pt-stated)       Current Barriers:  . Patient with complaints of lower extremity edema; notes L > R, though denies any recent worsening, more a chronic concern  o Wonders if related to insulin therapy, as he attributed some swelling to Humalog therapy previously . Hx PAD/R leg stenting previously followed by [facility]; current tx with furosemide 20 mg daily and HCTZ 25 mg daily  . Tx with amlodipine 10 mg daily since at least 2016 per our electronic medical records; patient unsure how long he has been on this dose of this medication . Patient denies improvement in swelling with elevation, though notes less swelling when he wears shoes  Pharmacist Clinical Goal(s):  Marland Kitchen Over the next 90 days, patient will work with multiple speciality teams to address needs related to concerns related to lower extremity edema  Interventions: . Comprehensive medication review performed. . Explained to patient that there could be multiple reasons for LEE: venous insufficiency, cardiovascular disease, amlodipine tx, etc. He reiterates that there has been no recent worsening, no recent worsening in SOB, more a chronic issue. Encouraged discussion with [place] provider at upcoming visit next month.  . Discussed use of compression stockings; patient notes that he "hates" wearing socks. Encouraged  him to contemplate this, especially if he can tell an appreciable difference when he wears shoes  Patient Self Care Activities:  . Self administers medications as prescribed . Attends all scheduled provider appointments . Calls provider office for new concerns or questions  Initial  goal documentation     . "My breathing isn't good" (pt-stated)       Current Barriers:  . Hx tobacco abuse, quit smoking ~30 years ago, though diagnosis of COPD/emphysema. Follows with [name] at [facility] pulmonary . Currently managed on ICS/LABA therapy with Symbicort; is taking Proair rescue inhaler every 4 hours while awake for SOB . Denies historical use of Spiriva or other LAMA medications . Spirometry from 04/2017 shows restrictive disease with no pre/post change. Breathing likely also impacted by obesity, obstructive sleep apnea (tx with CPAP)  Pharmacist Clinical Goal(s):  Marland Kitchen Over the next 30 days, patient will work with PharmD and pulmonary team to address needs related to optimized management of chronic lung disease  Interventions: . Comprehensive medication review performed. . Educated patient on controller vs rescue inhaler principals. Encouraged discussion with [name] regarding potential benefit from [facility] therapy; however, acknowledge that there may not be significant benefit if disease more restrictive and related to obesity   Patient Self Care Activities:  . Self administers medications as prescribed  Initial goal documentation        Mr. Tenbrink was given information about Chronic Care Management services today including:  1. CCM service includes personalized support from designated clinical staff supervised by his physician, including individualized plan of care and coordination with other care providers 2. 24/7 contact phone numbers for assistance for urgent and routine care needs. 3. Service will only be billed when office clinical staff spend 20 minutes or more in a month to coordinate care. 4. Only one practitioner may furnish and bill the service in a calendar month. 5. The patient may stop CCM services at any time (effective at the end of the month) by phone call to the office staff. 6. The patient will be responsible for cost sharing (co-pay) of up  to 20% of the service fee (after annual deductible is met).  Patient agreed to services and verbal consent obtained.   The patient verbalized understanding of instructions provided today and declined a print copy of patient instruction materials.   Plan: - PharmD will outreach the patient in 3-4 weeks for continued medication management support  Catie Darnelle Maffucci, PharmD Clinical Pharmacist [place] [phone]

## 2018-09-02 NOTE — Chronic Care Management (AMB) (Signed)
Chronic Care Management   Note  09/02/2018 Name: Justin Griffith MRN: [id number] DOB: 09/30/1940   Subjective:  Justin Griffith is a 78 y.o. year old male who is a primary care patient of Crissman, Jeannette How, MD. The CCM team was consulted for assistance with chronic disease management and care coordination needs.    Contacted patient telephonically to discuss CCM program. During our call, he noted that he was feeling shaky, sweaty. Encouraged to check blood sugar - was 58. Instructed patient to find 15 g of quick carbohydrates; he had glucose gel packs from the [name] provider. Kept patient on the phone, checked sugar 15 minutes later - reading was 62. Instructed to take another glucose gel pack, kept patient on the phone, checked sugar 15 minutes later: reading was 90. Patient and his wife were able to eat lunch; I instructed him to do so, but eat a snack if he doesn't immediately start eating lunch. Patient noted that he took a dose of Novolin R without eating breakfast this morning, as he was concerned about his fasting reading being "too high" at 186.    [name] was given information about Chronic Care Management services today including:  1. CCM service includes personalized support from designated clinical staff supervised by his physician, including individualized plan of care and coordination with other care providers 2. 24/7 contact phone numbers for assistance for urgent and routine care needs. 3. Service will only be billed when office clinical staff spend 20 minutes or more in a month to coordinate care. 4. Only one practitioner may furnish and bill the service in a calendar month. 5. The patient may stop CCM services at any time (effective at the end of the month) by phone call to the office staff. 6. The patient will be responsible for cost sharing (co-pay) of up to 20% of the service fee (after annual deductible is met).  Patient agreed to services and verbal consent obtained.    Review of patient status, including review of consultants reports, laboratory and other test data, was performed as part of comprehensive evaluation and provision of chronic care management services.   Objective:  Lab Results  Component Value Date   CREATININE 1.30 (H) 02/02/2017   CREATININE 1.44 (H) 07/23/2016   CREATININE 1.19 01/30/2016    Lab Results  Component Value Date   HGBA1C 7.7 01/11/2018       Component Value Date/Time   CHOL 89 02/02/2017 0823   TRIG 262 (H) 02/02/2017 0823   HDL 27 (L) 05/01/2015 1033   VLDL 52 (H) 02/02/2017 0823   LDLCALC Comment 05/01/2015 1033       BP Readings from Last 3 Encounters:  08/22/18 122/62  09/07/17 (!) 150/70  08/20/17 (!) 146/82    Allergies  Allergen Reactions  . Farxiga [Dapagliflozin] Rash  . Jardiance [Empagliflozin] Rash    Medications Reviewed Today    Reviewed by De [place], [facility] (Pharmacist) on 09/02/18 at 1231  Med List Status: <None>  Medication Order Taking? Sig Documenting Provider Last Dose Status Informant  acyclovir (ZOVIRAX) 400 MG tablet [id number] Yes Take 400 mg by mouth 2 (two) times daily. [provider] Taking Active   albuterol (PROVENTIL HFA) 108 (90 Base) MCG/ACT inhaler [id number] Yes Inhale 1-2 puffs into the lungs every 4 (four) hours as needed.  [provider] Taking Active            Med Note Darnelle Maffucci, Arville Lime   Fri Sep 02, 2018  11:46 AM) Using 2 puffs Q4H  amLODipine (NORVASC) 10 MG tablet [id number] Yes Take 10 mg by mouth daily.  [provider] Taking Active   aspirin EC 81 MG tablet [id number] Yes Take 81 mg by mouth daily.  [provider] Taking Active Self           Med Note Jens Som Sep 16, 2015  2:29 PM)    budesonide-formoterol [facility]) 80-4.5 MCG/ACT inhaler [id number] Yes Inhale 2 puffs into the lungs 2 (two) times daily. [provider] Taking Active   carvedilol (COREG) 25 MG tablet [id number] Yes Take 1  tablet (25 mg total) by mouth 2 (two) times daily with a meal. Crissman, Jeannette How, MD Taking Active   cloNIDine (CATAPRES) 0.1 MG tablet [id number] Yes Take 1 tablet (0.1 mg total) by mouth 2 (two) times daily.  Patient taking differently: Take 0.2 mg by mouth 2 (two) times daily.    Guadalupe Maple, MD Taking Active   clotrimazole-betamethasone Donalynn Furlong) lotion [id number] Yes Apply topically 2 (two) times daily. Volney American, PA-C Taking Active            Med Note Darnelle Maffucci, Arville Lime   Fri Sep 02, 2018 11:43 AM) Using 1/month  furosemide (LASIX) 20 MG tablet [id number] Yes Take 20 mg by mouth daily. [provider] Taking Active   hydrochlorothiazide (HYDRODIURIL) 25 MG tablet [id number] Yes Take 25 mg by mouth daily. [provider] Taking Active   insulin glargine (LANTUS) 100 UNIT/ML injection [id number] Yes Inject 60 Units into the skin daily.  [provider] Taking Active   insulin regular (NOVOLIN R) 100 units/mL injection [id number] Yes Inject 20 Units into the skin 3 (three) times daily before meals. [provider] Taking Active   losartan (COZAAR) 100 MG tablet [id number] Yes Take 100 mg by mouth daily. [provider] Taking Active   metFORMIN (GLUCOPHAGE) 500 MG tablet [id number] Yes Take 1,000 mg by mouth 2 (two) times daily with a meal. Take 2 y mouth 2 times daily [provider] Taking Active   naproxen (NAPROSYN) 500 MG tablet [id number] Yes Take 500 mg by mouth 2 (two) times daily with a meal. [provider] Taking Active   rosuvastatin (CRESTOR) 40 MG tablet [id number] Yes Take 1 tablet (40 mg total) by mouth at bedtime.  Patient taking differently: Take 40 mg by mouth at bedtime. 20 mg once a day   Guadalupe Maple, MD Taking Active   tamsulosin (FLOMAX) 0.4 MG CAPS capsule [id number] Yes Take 0.4 mg by mouth daily. [provider] Taking Active          Assessment:   Goals Addressed             This Visit's Progress     Patient Stated   . "I don't know much about my diabetes" (pt-stated)       Current Barriers:  Marland Kitchen Knowledge Deficits related to optimal management of diabetes . Uncontrolled diabetes last A1c 8.5%; [facility]ology ([name]) . Managed on Lantus 60 units QAM and Novolin R 20 units TID with meals; metformin 1000 mg BID o Hx significant GI upset and intolerance with Victoza and Trulicity o Hx allergic rash to [place] o Notes that he was previously on Humalog and it "made my ankles swell" o Notes that Lantus "works better" than Toujeo for him . Lack of knowledge about appropriate prevention and management  of hypoglycemia o Episode of hypoglycemia while on the phone today d/t taking Novolin R without eating breakfast . Dietary indiscretions - snacking after supper on cookies most nights   Pharmacist Clinical Goal(s):  Marland Kitchen Over the next 90 days, patient will work with PharmD, primary care provider, and endocrinology to address needs related to improved self-management of diabetes  Interventions: . Comprehensive medication review performed. Marland Kitchen Extensive discussion of optimal management of hypoglycemia as above. Patient verbalized understanding . Advised to AVOID taking Novolin R if he isn't going to eat a meal. Educated on pharmacokinetics of basal vs bolus insulin. Patient inquisitive about if his pancreas still makes insulin - educated that with duration of ~15 years of diabetes, he still may have some endogenous insulin productions, but exogenous insulin is needed to supplement. . Extensive discussion regarding reduced evening snacking to target improved AM fasting blood sugars, to avoid patient feeling the need to correct with rapid acting insulins. Suggested switching to sugar free cookies or air popped popcorn, if it is too difficult to cut snacking altogether  . Educated on goal A1c, goal fasting and goal  post-prandial blood glucose  Patient Self Care Activities:  . Patient will continue to take medications as prescribed . Patient will continue to check blood sugar, fasting, 2 hour post prandial, and when feeling unwell.   Initial goal documentation     . "I have a lot of leg swelling" (pt-stated)       Current Barriers:  . Patient with complaints of lower extremity edema; notes L > R, though denies any recent worsening, more a chronic concern  o Wonders if related to insulin therapy, as he attributed some swelling to Humalog therapy previously . Hx PAD/R leg stenting previously followed by [facility]; current tx with furosemide 20 mg daily and HCTZ 25 mg daily  . Tx with amlodipine 10 mg daily since at least 2016 per our electronic medical records; patient unsure how long he has been on this dose of this medication . Patient denies improvement in swelling with elevation, though notes less swelling when he wears shoes  Pharmacist Clinical Goal(s):  Marland Kitchen Over the next 90 days, patient will work with multiple speciality teams to address needs related to concerns related to lower extremity edema  Interventions: . Comprehensive medication review performed. . Explained to patient that there could be multiple reasons for LEE: venous insufficiency, cardiovascular disease, amlodipine tx, etc. He reiterates that there has been no recent worsening, no recent worsening in SOB, more a chronic issue. Encouraged discussion with [place] provider at upcoming visit next month.  . Discussed use of compression stockings; patient notes that he "hates" wearing socks. Encouraged him to contemplate this, especially if he can tell an appreciable difference when he wears shoes  Patient Self Care Activities:  . Self administers medications as prescribed . Attends all scheduled provider appointments . Calls provider office for new concerns or questions  Initial goal documentation     . "My breathing isn't  good" (pt-stated)       Current Barriers:  . Hx tobacco abuse, quit smoking ~30 years ago, though diagnosis of COPD/emphysema. Follows with [name] at [facility] pulmonary . Currently managed on ICS/LABA therapy with Symbicort; is taking Proair rescue inhaler every 4 hours while awake for SOB . Denies historical use of Spiriva or other LAMA medications . Spirometry from 04/2017 shows restrictive disease with no pre/post change. Breathing likely also impacted by obesity, obstructive sleep apnea (tx with CPAP)  Pharmacist Clinical Goal(s):  Marland Kitchen Over the next 30 days, patient will work with PharmD and pulmonary team to address needs related to optimized management of chronic lung disease  Interventions: . Comprehensive medication review performed. . Educated patient on controller vs rescue inhaler principals. Encouraged discussion with [name] regarding potential benefit from [facility] therapy; however, acknowledge that there may not be significant benefit if disease more restrictive and related to obesity   Patient Self Care Activities:  . Self administers medications as prescribed  Initial goal documentation        Plan: - PharmD will outreach the patient in 3-4 weeks for continued medication management support  Catie Darnelle Maffucci, PharmD Clinical Pharmacist [place] [phone]

## 2018-09-07 DIAGNOSIS — R0600 Dyspnea, unspecified: Secondary | ICD-10-CM | POA: Diagnosis not present

## 2018-09-07 DIAGNOSIS — L851 Acquired keratosis [keratoderma] palmaris et plantaris: Secondary | ICD-10-CM | POA: Diagnosis not present

## 2018-09-07 DIAGNOSIS — E1140 Type 2 diabetes mellitus with diabetic neuropathy, unspecified: Secondary | ICD-10-CM | POA: Diagnosis not present

## 2018-09-07 DIAGNOSIS — B351 Tinea unguium: Secondary | ICD-10-CM | POA: Diagnosis not present

## 2018-09-07 DIAGNOSIS — Z794 Long term (current) use of insulin: Secondary | ICD-10-CM | POA: Diagnosis not present

## 2018-09-07 DIAGNOSIS — J449 Chronic obstructive pulmonary disease, unspecified: Secondary | ICD-10-CM | POA: Diagnosis not present

## 2018-09-07 DIAGNOSIS — G4733 Obstructive sleep apnea (adult) (pediatric): Secondary | ICD-10-CM | POA: Diagnosis not present

## 2018-10-04 DIAGNOSIS — E1159 Type 2 diabetes mellitus with other circulatory complications: Secondary | ICD-10-CM | POA: Diagnosis not present

## 2018-10-04 DIAGNOSIS — I10 Essential (primary) hypertension: Secondary | ICD-10-CM | POA: Diagnosis not present

## 2018-10-04 DIAGNOSIS — E785 Hyperlipidemia, unspecified: Secondary | ICD-10-CM | POA: Diagnosis not present

## 2018-10-04 DIAGNOSIS — E1142 Type 2 diabetes mellitus with diabetic polyneuropathy: Secondary | ICD-10-CM | POA: Diagnosis not present

## 2018-10-04 DIAGNOSIS — Z794 Long term (current) use of insulin: Secondary | ICD-10-CM | POA: Diagnosis not present

## 2018-10-04 DIAGNOSIS — E1169 Type 2 diabetes mellitus with other specified complication: Secondary | ICD-10-CM | POA: Diagnosis not present

## 2018-10-11 ENCOUNTER — Ambulatory Visit (INDEPENDENT_AMBULATORY_CARE_PROVIDER_SITE_OTHER): Payer: Medicare Other | Admitting: Pharmacist

## 2018-10-11 DIAGNOSIS — J449 Chronic obstructive pulmonary disease, unspecified: Secondary | ICD-10-CM | POA: Diagnosis not present

## 2018-10-11 DIAGNOSIS — I10 Essential (primary) hypertension: Secondary | ICD-10-CM | POA: Diagnosis not present

## 2018-10-11 NOTE — Patient Instructions (Signed)
Visit Information  Goals Addressed            This Visit's Progress     Patient Stated   . "I have a lot of leg swelling" (pt-stated)       Current Barriers:  . Patient with complaints of lower extremity edema; though denies any recent worsening, more a chronic concern  o Wonders if related to insulin therapy, as he attributed some swelling to Humalog therapy previously . Hx PAD/R leg stenting previously followed by [facility]; current tx with furosemide 20 mg daily and HCTZ 25 mg daily. Follows with [name], last appointment 07/2018 with no concerns regarding edema . Tx with amlodipine 10 mg daily since at least 2016 per our electronic medical records; patient unsure how long he has been on this dose of this medication . Patient interested in decreasing/discontinuing amlodipine, wonders if there ar eother antihypertensives he could try. He would like to talk to Dr. Jeananne Rama about this. . Notes that he received a BP machine from the [name] in the past year, but that it always reads SBP 160-170, while SBP always 120-130s when checked at provider offices. I do not recognized the name brand of the meter. Due to the discrepancies between office and home BP, patient has not been checking BP at home  Pharmacist Clinical Goal(s):  Marland Kitchen Over the next 90 days, patient will work with multiple speciality teams to address needs related to concerns related to lower extremity edema  Interventions: . Reiterated to patient that PVD likely cause of LEE, though high dose amlodipine may be contributing. Could consider reducing amlodipine to 5 mg daily and monitoring BP at home with a new BP meter. Could reduce amlodipine and switch HCTZ to chlorthalidone for increased potency to balance amlodipine dose reduction. In the future, could consider use of spironolactone for enhanced Bp/fluid removal benefit. Messaged Dr. Jeananne Rama - he recommended patient schedule an in-office visit with an alternative  provider. Communicated this information to patient's wife.  . Encouraged patient that if any antihypertensive changes were made, it would be helpful for him to accurately check BP at home. Encouraged patient to consider buying a new BP cuff, or [facility] to [facility] could [facility] the one they gave him.   Patient Self Care Activities:  . Self administers medications as prescribed . Attends all scheduled provider appointments . Calls provider office for new concerns or questions  Please see past updates related to this goal by clicking on the "Past Updates" button in the selected goal      . "My breathing isn't good" (pt-stated)       Current Barriers:  . Hx tobacco abuse, quit smoking ~30 years ago, though diagnosis of COPD/emphysema. Follows with [name] at [facility] pulmonary . At visit with [name] last month, Breo was discontinued and patient was placed on Duonebs Q6 hours. Patient endorses significant improvement in breathing, as well as reduction in use of Proventil rescue therapy.  Marland Kitchen Spirometry from 04/2017 shows restrictive disease with no pre/post change. Breathing likely also impacted by obesity, obstructive sleep apnea (tx with CPAP)  Pharmacist Clinical Goal(s):  Marland Kitchen Over the next 90 days, patient will work with PharmD and pulmonary team to address needs related to optimized management of chronic lung disease  Interventions: . Educated patient that albuterol is in Duonebs nebulized medication, explaining his reduced need for rescue albuterol. Educated to continue Duonebs daily. Patient excited about the benefit in his breathing that  he has experienced.   Patient Self Care Activities:  . Self administers medications as prescribed  Please see past updates related to this goal by clicking on the "Past Updates" button in the selected goal         The patient verbalized understanding of instructions provided today and declined a print copy of patient  instruction materials.   Plan:  - Will continue to support patient and medication management concerns, and will outreach again in 3-5 weeks  Catie Darnelle Maffucci, PharmD Clinical Pharmacist [place] [phone]

## 2018-10-11 NOTE — Chronic Care Management (AMB) (Signed)
Chronic Care Management   Follow Up Note   10/11/2018 Name: CARRINGTON OLAZABAL MRN: [id number] DOB: 13-Nov-1940  Referred by: Guadalupe Maple, MD Reason for referral : Chronic Care Management (Medication Management)   BERT PTACEK is a 78 y.o. year old male who is a primary care patient of Crissman, Jeannette How, MD. The CCM team was consulted for assistance with chronic disease management and care coordination needs.    Contacted patient telephonically today to discuss medication management.   Review of patient status, including review of consultants reports, relevant laboratory and other test results, and collaboration with appropriate care team members and the patient's provider was performed as part of comprehensive patient evaluation and provision of chronic care management services.    Goals Addressed            This Visit's Progress     Patient Stated   . "I have a lot of leg swelling" (pt-stated)       Current Barriers:  . Patient with complaints of lower extremity edema; though denies any recent worsening, more a chronic concern  o Wonders if related to insulin therapy, as he attributed some swelling to Humalog therapy previously . Hx PAD/R leg stenting previously followed by [facility]; current tx with furosemide 20 mg daily and HCTZ 25 mg daily. Follows with [name], last appointment 07/2018 with no concerns regarding edema . Tx with amlodipine 10 mg daily since at least 2016 per our electronic medical records; patient unsure how long he has been on this dose of this medication . Patient interested in decreasing/discontinuing amlodipine, wonders if there ar eother antihypertensives he could try. He would like to talk to Dr. Jeananne Rama about this. . Notes that he received a BP machine from the [name] in the past year, but that it always reads SBP 160-170, while SBP always 120-130s when checked at provider offices. I do not recognized the name brand of the meter. Due to the  discrepancies between office and home BP, patient has not been checking BP at home  Pharmacist Clinical Goal(s):  Marland Kitchen Over the next 90 days, patient will work with multiple speciality teams to address needs related to concerns related to lower extremity edema  Interventions: . Reiterated to patient that PVD likely cause of LEE, though high dose amlodipine may be contributing. Could consider reducing amlodipine to 5 mg daily and monitoring BP at home with a new BP meter. Could reduce amlodipine and switch HCTZ to chlorthalidone for increased potency to balance amlodipine dose reduction. In the future, could consider use of spironolactone for enhanced Bp/fluid removal benefit. Messaged Dr. Jeananne Rama - he recommended patient schedule an in-office visit with an alternative provider. Communicated this information to patient's wife.  . Encouraged patient that if any antihypertensive changes were made, it would be helpful for him to accurately check BP at home. Encouraged patient to consider buying a new BP cuff, or [facility] him.   Patient Self Care Activities:  . Self administers medications as prescribed . Attends all scheduled provider appointments . Calls provider office for new concerns or questions  Please see past updates related to this goal by clicking on the "Past Updates" button in the selected goal      . "My breathing isn't good" (pt-stated)       Current Barriers:  . Hx tobacco abuse, quit smoking ~30 years ago, though diagnosis of COPD/emphysema. Follows with Dr.  [name] at [facility] pulmonary . At visit with [name] last month, Breo was discontinued and patient was placed on Duonebs Q6 hours. Patient endorses significant improvement in breathing, as well as reduction in use of Proventil rescue therapy.  Marland Kitchen Spirometry from 04/2017 shows restrictive disease with no pre/post change. Breathing likely also impacted by obesity,  obstructive sleep apnea (tx with CPAP)  Pharmacist Clinical Goal(s):  Marland Kitchen Over the next 90 days, patient will work with PharmD and pulmonary team to address needs related to optimized management of chronic lung disease  Interventions: . Educated patient that albuterol is in Duonebs nebulized medication, explaining his reduced need for rescue albuterol. Educated to continue Duonebs daily. Patient excited about the benefit in his breathing that he has experienced.   Patient Self Care Activities:  . Self administers medications as prescribed  Please see past updates related to this goal by clicking on the "Past Updates" button in the selected goal          Plan:  - Will continue to support patient and medication management concerns, and will outreach again in 3-5 weeks  Catie Darnelle Maffucci, PharmD Clinical Pharmacist [place] [phone]

## 2018-10-14 ENCOUNTER — Encounter: Payer: Self-pay | Admitting: General Surgery

## 2018-11-08 ENCOUNTER — Ambulatory Visit (INDEPENDENT_AMBULATORY_CARE_PROVIDER_SITE_OTHER): Payer: Medicare Other | Admitting: Pharmacist

## 2018-11-08 DIAGNOSIS — I10 Essential (primary) hypertension: Secondary | ICD-10-CM | POA: Diagnosis not present

## 2018-11-08 DIAGNOSIS — J449 Chronic obstructive pulmonary disease, unspecified: Secondary | ICD-10-CM | POA: Diagnosis not present

## 2018-11-08 DIAGNOSIS — E119 Type 2 diabetes mellitus without complications: Secondary | ICD-10-CM | POA: Diagnosis not present

## 2018-11-08 NOTE — Patient Instructions (Signed)
Visit Information  Goals Addressed            This Visit's Progress     Patient Stated   . "I don't know much about my diabetes" (pt-stated)       Current Barriers:  Marland Kitchen Knowledge Deficits related to optimal management of diabetes . Uncontrolled diabetes last A1c 8.5%; [facility] ([name]) . Managed on Lantus 75 units QAM and Novolin R 25-30 units TID with meals; metformin 1000 mg BID o Hx significant GI upset and intolerance with Victoza and Trulicity o Hx allergic rash to [place] o Notes that he was previously on Humalog and it "made my ankles swell" o Notes that Lantus "works better" than Toujeo for him . Lack of knowledge about appropriate prevention and management of hypoglycemia o Denies any episodes of hypoglycemia since our last calls, as he has stopped taking Novolin if he skips a meal . Dietary indiscretions - still notes occasional snacks (cookies) . Glucose readings: reports a fasting of 145 and afternoon of 135 yesterday . Cardiovascular risk reduction:  o Antihypertensives: amlodipine 10 mg daily, carvedilol 25 mg BID, clonidine 0.1 BID, HCTZ 25 mg, losartan 100 mg daily; BP generally well controlled in clinic o Antihyperlipidemics: rosuvastatin 40 mg daily; LDL at goal <70 on last check per Care Everywhere  Pharmacist Clinical Goal(s):  Marland Kitchen Over the next 90 days, patient will work with PharmD, primary care provider, and endocrinology to address needs related to improved self-management of diabetes  Interventions: . Congratulated on continued avoidance of hypoglycemic episodes. Encouraged continued medication adherence  Patient Self Care Activities:  . Patient will continue to take medications as prescribed . Patient will continue to check blood sugar, fasting, 2 hour post prandial, and when feeling unwell.   Please see past updates related to this goal by clicking on the "Past Updates" button in the  selected goal      . "I have a lot of leg swelling" (pt-stated)       Current Barriers:  . Patient with complaints of lower extremity edema; though denies any recent worsening, more a chronic concern. Continues to be concerned today.   o Believes it to be related to amlodipine therapy  o Wonders if related to insulin therapy, as he attributed some swelling to Humalog therapy previously . Hx PAD/R leg stenting previously followed by [facility]; current tx with furosemide 20 mg daily and HCTZ 25 mg daily. Follows with [name], last appointment 07/2018 with no concerns regarding edema . Tx with amlodipine 10 mg daily since at least 2016 per our electronic medical records; patient unsure how long he has been on this dose of this medication . Discussed with Dr. Jeananne Rama after our last phone call, he recommended patient schedule appointment with a provider here in office. Patient has not done that yet. He has an appointment with his [place] provider on 11/23/2018.  Pharmacist Clinical Goal(s):  Marland Kitchen Over the next 90 days, patient will work with multiple speciality teams to address needs related to concerns related to lower extremity edema  Interventions: . PVD likely cause of LEE, though high dose amlodipine may be contributing. Patient plans to schedule an appointment with a provider here in office. Explained to him that there may be other causes for the increased leg swelling, and this should be evaluated.  o Could consider reducing amlodipine to 5 mg daily and monitoring BP at home with a new BP meter. Could  also consider switching HCTZ to chlorthalidone for increased potency to balance amlodipine dose reduction.  o In the future, could consider use of spironolactone for enhanced BP/fluid removal benefit.  . Encouraged patient to bring home BP meter to clinic for comparison, as he notes it always runs higher at home than his BP generally is in office  Patient Self Care Activities:  . Self  administers medications as prescribed . Attends all scheduled provider appointments . Calls provider office for new concerns or questions  Please see past updates related to this goal by clicking on the "Past Updates" button in the selected goal      . "My breathing isn't good" (pt-stated)       Current Barriers:  . Hx tobacco abuse, quit smoking ~30 years ago, though diagnosis of COPD/emphysema. Follow[facility] with [name] at [facility] pulmonary . At visit with [name] last month, Breo was discontinued and patient was placed on Duonebs Q6 hours. Patient also had Symbicort inhaler at home.  . Today, patient reports taking Duonebs Q6H, Symbicort BID, and two different albuterol HFA inhalers that he believes are different things.   Pharmacist Clinical Goal(s):  Marland Kitchen Over the next 90 days, patient will work with PharmD and pulmonary team to address needs related to optimized management of chronic lung disease  Interventions: . Counseled on Dr. Gust Brooms intended controller (Duonebs) + reliever (either albuterol inhaler) regimen, as [name] had recommended switching from LABA/ICS therapy to duonebs. Patient verbalized understanding   Patient Self Care Activities:  . Self administers medications as prescribed  Please see past updates related to this goal by clicking on the "Past Updates" button in the selected goal         The patient verbalized understanding of instructions provided today and declined a print copy of patient instruction materials.   Plan:  - Will collaborate with whomever patient sees in office regarding medication-related recommendations above - Will outreach patient in ~4-5 weeks for continued medication management support  Catie Darnelle Maffucci, PharmD Clinical Pharmacist [place] [phone]

## 2018-11-08 NOTE — Chronic Care Management (AMB) (Signed)
Chronic Care Management   Follow Up Note   11/08/2018 Name: Justin Griffith MRN: [id number] DOB: 1940/07/11  Referred by: Guadalupe Maple, MD Reason for referral : Chronic Care Management (Medication Management)   Justin Griffith is a 78 y.o. year old male who is a primary care patient of Crissman, Jeannette How, MD. The CCM team was consulted for assistance with chronic disease management and care coordination needs.    Contacted patient for medication management support.   Review of patient status, including review of consultants reports, relevant laboratory and other test results, and collaboration with appropriate care team members and the patient's provider was performed as part of comprehensive patient evaluation and provision of chronic care management services.    Outpatient Encounter Medications as of 11/08/2018  Medication Sig Note  . amLODipine (NORVASC) 10 MG tablet Take 10 mg by mouth daily.    Marland Kitchen acyclovir (ZOVIRAX) 400 MG tablet Take 400 mg by mouth 2 (two) times daily.   Marland Kitchen albuterol (PROVENTIL HFA) 108 (90 Base) MCG/ACT inhaler Inhale 1-2 puffs into the lungs every 4 (four) hours as needed.  09/02/2018: Using 2 puffs Q4H  . aspirin EC 81 MG tablet Take 81 mg by mouth daily.    . carvedilol (COREG) 25 MG tablet Take 1 tablet (25 mg total) by mouth 2 (two) times daily with a meal.   . cloNIDine (CATAPRES) 0.1 MG tablet Take 1 tablet (0.1 mg total) by mouth 2 (two) times daily. (Patient taking differently: Take 0.2 mg by mouth 2 (two) times daily. )   . clotrimazole-betamethasone (LOTRISONE) lotion Apply topically 2 (two) times daily. 09/02/2018: Using 1/month  . furosemide (LASIX) 20 MG tablet Take 20 mg by mouth daily.   . hydrochlorothiazide (HYDRODIURIL) 25 MG tablet Take 25 mg by mouth daily.   . insulin glargine (LANTUS) 100 UNIT/ML injection Inject 75 Units into the skin daily.    . insulin regular (NOVOLIN R) 100 units/mL injection Inject 30 Units into the skin 3 (three) times  daily before meals.    Marland Kitchen ipratropium-albuterol (DUONEB) 0.5-2.5 (3) MG/3ML SOLN Take 3 mLs by nebulization every 6 (six) hours.   Marland Kitchen losartan (COZAAR) 100 MG tablet Take 100 mg by mouth daily.   . metFORMIN (GLUCOPHAGE) 500 MG tablet Take 1,000 mg by mouth 2 (two) times daily with a meal. Take 2 y mouth 2 times daily   . naproxen (NAPROSYN) 500 MG tablet Take 500 mg by mouth 2 (two) times daily with a meal.   . rosuvastatin (CRESTOR) 40 MG tablet Take 1 tablet (40 mg total) by mouth at bedtime. (Patient taking differently: Take 40 mg by mouth at bedtime. 20 mg once a day)   . tamsulosin (FLOMAX) 0.4 MG CAPS capsule Take 0.4 mg by mouth daily.    No facility-administered encounter medications on file as of 11/08/2018.      Goals Addressed            This Visit's Progress     Patient Stated   . "I don't know much about my diabetes" (pt-stated)       Current Barriers:  Marland Kitchen Knowledge Deficits related to optimal management of diabetes . Uncontrolled diabetes last A1c 8.5%; [facility]inology ([name]) . Managed on Lantus 75 units QAM and Novolin R 25-30 units TID with meals; metformin 1000 mg BID o Hx significant GI upset and intolerance with Victoza and Trulicity o Hx allergic rash to [place]  o Notes that he was previously on Humalog and it "made my ankles swell" o Notes that Lantus "works better" than Toujeo for him . Lack of knowledge about appropriate prevention and management of hypoglycemia o Denies any episodes of hypoglycemia since our last calls, as he has stopped taking Novolin if he skips a meal . Dietary indiscretions - still notes occasional snacks (cookies) . Glucose readings: reports a fasting of 145 and afternoon of 135 yesterday . Cardiovascular risk reduction:  o Antihypertensives: amlodipine 10 mg daily, carvedilol 25 mg BID, clonidine 0.1 BID, HCTZ 25 mg, losartan 100 mg daily; BP generally well controlled in  clinic o Antihyperlipidemics: rosuvastatin 40 mg daily; LDL at goal <70 on last check per Care Everywhere  Pharmacist Clinical Goal(s):  Marland Kitchen Over the next 90 days, patient will work with PharmD, primary care provider, and endocrinology to address needs related to improved self-management of diabetes  Interventions: . Congratulated on continued avoidance of hypoglycemic episodes. Encouraged continued medication adherence  Patient Self Care Activities:  . Patient will continue to take medications as prescribed . Patient will continue to check blood sugar, fasting, 2 hour post prandial, and when feeling unwell.   Please see past updates related to this goal by clicking on the "Past Updates" button in the selected goal      . "I have a lot of leg swelling" (pt-stated)       Current Barriers:  . Patient with complaints of lower extremity edema; though denies any recent worsening, more a chronic concern. Continues to be concerned today.   o Believes it to be related to amlodipine therapy  o Wonders if related to insulin therapy, as he attributed some swelling to Humalog therapy previously . Hx PAD/R leg stenting previously followed by [facility]; current tx with furosemide 20 mg daily and HCTZ 25 mg daily. Follows with [name], last appointment 07/2018 with no concerns regarding edema . Tx with amlodipine 10 mg daily since at least 2016 per our electronic medical records; patient unsure how long he has been on this dose of this medication . Discussed with Dr. Jeananne Rama after our last phone call, he recommended patient schedule appointment with a provider here in office. Patient has not done that yet. He has an appointment with his [place] provider on 11/23/2018.  Pharmacist Clinical Goal(s):  Marland Kitchen Over the next 90 days, patient will work with multiple speciality teams to address needs related to concerns related to lower extremity edema  Interventions: . PVD likely cause of LEE, though high  dose amlodipine may be contributing. Patient plans to schedule an appointment with a provider here in office. Explained to him that there may be other causes for the increased leg swelling, and this should be evaluated.  o Could consider reducing amlodipine to 5 mg daily and monitoring BP at home with a new BP meter. Could also consider switching HCTZ to chlorthalidone for increased potency to balance amlodipine dose reduction.  o In the future, could consider use of spironolactone for enhanced BP/fluid removal benefit.  . Encouraged patient to bring home BP meter to clinic for comparison, as he notes it always runs higher at home than his BP generally is in office  Patient Self Care Activities:  . Self administers medications as prescribed . Attends all scheduled provider appointments . Calls provider office for new concerns or questions  Please see past updates related to this goal by clicking on the "Past Updates" button in the selected goal      . "  My breathing isn't good" (pt-stated)       Current Barriers:  . Hx tobacco abuse, quit smoking ~30 years ago, though diagnosis of COPD/emphysema. Follows with [name] at [facility] pulmonary . At visit with [name] last month, Breo was discontinued and patient was placed on Duonebs Q6 hours. Patient also had Symbicort inhaler at home.  . Today, patient reports taking Duonebs Q6H, Symbicort BID, and two different albuterol HFA inhalers that he believes are different things.   Pharmacist Clinical Goal(s):  Marland Kitchen Over the next 90 days, patient will work with PharmD and pulmonary team to address needs related to optimized management of chronic lung disease  Interventions: . Counseled on Dr. Gust Brooms intended controller (Duonebs) + reliever (either albuterol inhaler) regimen, as [name] had recommended switching from LABA/ICS therapy to duonebs. Patient verbalized understanding   Patient Self Care Activities:  . Self administers  medications as prescribed  Please see past updates related to this goal by clicking on the "Past Updates" button in the selected goal         Plan:  - Will collaborate with whomever patient sees in office regarding medication-related recommendations above - Will outreach patient in ~4-5 weeks for continued medication management support  Catie Darnelle Maffucci, PharmD Clinical Pharmacist [place] [phone]

## 2018-11-14 ENCOUNTER — Other Ambulatory Visit: Payer: Self-pay

## 2018-11-14 ENCOUNTER — Encounter: Payer: Self-pay | Admitting: Nurse Practitioner

## 2018-11-14 ENCOUNTER — Ambulatory Visit (INDEPENDENT_AMBULATORY_CARE_PROVIDER_SITE_OTHER): Payer: Medicare Other | Admitting: Nurse Practitioner

## 2018-11-14 VITALS — BP 148/70 | HR 67 | Temp 98.2°F

## 2018-11-14 DIAGNOSIS — R600 Localized edema: Secondary | ICD-10-CM

## 2018-11-14 DIAGNOSIS — R251 Tremor, unspecified: Secondary | ICD-10-CM

## 2018-11-14 DIAGNOSIS — I10 Essential (primary) hypertension: Secondary | ICD-10-CM

## 2018-11-14 MED ORDER — FUROSEMIDE 20 MG PO TABS: 40.0000 mg | ORAL_TABLET | Freq: Every day | ORAL | 1 refills | Status: DC

## 2018-11-14 NOTE — Addendum Note (Signed)
Addended by: [name] on: 11/14/2018 08:42 PM   Modules accepted: Orders

## 2018-11-14 NOTE — Assessment & Plan Note (Signed)
Ongoing since starting Amlodipine.  Will discontinue at this time and increase Furosemide to 40 MG daily.  Is ongoing elevations in BP may need to adjust Clonidine dose.  Labs today.  Return in 4 weeks.

## 2018-11-14 NOTE — Assessment & Plan Note (Signed)
Chronic, ongoing.  Poor control, poor diet.  Edema present with Amlodipine, will discontinue this and increase Lasix at this time to 40 MG.  Labs today.  Continue Losartan, Clonidine, HCTZ, Carvedilol, and ASA.  Recommend continuing to check BP at home.  Return in 4 weeks.

## 2018-11-14 NOTE — Assessment & Plan Note (Addendum)
Per patient report to upper extremity L>R (appears mixed as notices at rest and when lifting items), did not note at visit today.  He would like referral to neurology.  Referral placed.

## 2018-11-14 NOTE — Patient Instructions (Signed)
Edema  Edema is when you have too much fluid in your body or under your skin. Edema may make your legs, feet, and ankles swell up. Swelling is also common in looser tissues, like around your eyes. This is a common condition. It gets more common as you get older. There are many possible causes of edema. Eating too much salt (sodium) and being on your feet or sitting for a long time can cause edema in your legs, feet, and ankles. Hot weather may make edema worse. Edema is usually painless. Your skin may look swollen or shiny. Follow these instructions at home:  Keep the swollen body part raised (elevated) above the level of your heart when you are sitting or lying down.  Do not sit still or stand for a long time.  Do not wear tight clothes. Do not wear garters on your upper legs.  Exercise your legs. This can help the swelling go down.  Wear elastic bandages or support stockings as told by your doctor.  Eat a low-salt (low-sodium) diet to reduce fluid as told by your doctor.  Depending on the cause of your swelling, you may need to limit how much fluid you drink (fluid restriction).  Take over-the-counter and prescription medicines only as told by your doctor. Contact a doctor if:  Treatment is not working.  You have heart, liver, or kidney disease and have symptoms of edema.  You have sudden and unexplained weight gain. Get help right away if:  You have shortness of breath or chest pain.  You cannot breathe when you lie down.  You have pain, redness, or warmth in the swollen areas.  You have heart, liver, or kidney disease and get edema all of a sudden.  You have a fever and your symptoms get worse all of a sudden. Summary  Edema is when you have too much fluid in your body or under your skin.  Edema may make your legs, feet, and ankles swell up. Swelling is also common in looser tissues, like around your eyes.  Raise (elevate) the swollen body part above the level of your  heart when you are sitting or lying down.  Follow your doctor's instructions about diet and how much fluid you can drink (fluid restriction). This information is not intended to replace advice given to you by your health care provider. Make sure you discuss any questions you have with your health care provider. Document Released: 08/26/2007 Document Revised: 03/12/2017 Document Reviewed: 03/27/2016 Elsevier Patient Education  2020 [employer]  

## 2018-11-14 NOTE — Progress Notes (Addendum)
BP (!) 148/70 (BP Location: Left Arm, Patient Position: Sitting)   Pulse 67   Temp 98.2 F (36.8 C) (Oral)   SpO2 90%    Subjective:    Patient ID: Justin Griffith, male    DOB: Jan 21, 1941, 78 y.o.   MRN: [id number]  HPI: Justin Griffith is a 78 y.o. male  Chief Complaint  Patient presents with  . Edema    pt states he has had bilateral foot and leg swelling since starting Amlodipine    EDEMA: Started on Amlodipine at [name] in April and has had ongoing issues with leg edema since.  Taking Losartan 100 MG, Lasix 20 MG, HCTZ 25 MG, Carvedilol 25 MG BID, Clonidine 0.2 MG BID, and ASA.  EF on echo April 2020 55%, last saw cardiology in May 2020.  He denies any increased SOB from his baseline, has underlying COPD.  Denies orthopnea.  States his SOB is at baseline, going from his house to mailbox causes some SOB.  No productive cough. Hypertension status: uncontrolled  Satisfied with current treatment? no Duration of hypertension: chronic BP monitoring frequency:  daily BP range: 150-160/60-70's, then recently went to Dr. Rayburn Go and it was 133/70's BP medication side effects:  no Medication compliance: good compliance Aspirin: yes Recurrent headaches: no Visual changes: no Palpitations: no Dyspnea: at baseline, reports his SOB is not worse with his underlying COPD Chest pain: no Lower extremity edema: yes, since April Dizzy/lightheaded: no   RESTING TREMOR: Started being present in June 2019, noted more to left upper extremity per patient report.  Has a family history of Parkinson's, his father had it.  Has noticed when resting at times starts to have tremor in left hand and then when picking items up notices it.  Does report noticing this every day, but not currently interfering with ADLs.  Denies memory changes or shuffling of gait.  States he has been moving slower than normal for him.  He denies heavy use of alcohol.  Would like neurology referral for further assessment.  Relevant  past medical, surgical, family and social history reviewed and updated as indicated. Interim medical history since our last visit reviewed. Allergies and medications reviewed and updated.  Review of Systems  Constitutional: Negative for activity change, diaphoresis, fatigue and fever.  Respiratory: Negative for cough, chest tightness, shortness of breath and wheezing.   Cardiovascular: Negative for chest pain, palpitations and leg swelling.  Gastrointestinal: Negative for abdominal distention, abdominal pain, constipation, diarrhea, nausea and vomiting.  Neurological: Positive for tremors. Negative for dizziness, syncope, weakness, light-headedness, numbness and headaches.  Psychiatric/Behavioral: Negative.     Per HPI unless specifically indicated above     Objective:    BP (!) 148/70 (BP Location: Left Arm, Patient Position: Sitting)   Pulse 67   Temp 98.2 F (36.8 C) (Oral)   SpO2 90%   Wt Readings from Last 3 Encounters:  08/22/18 (!) 304 lb (137.9 kg)  09/07/17 (!) 307 lb (139.3 kg)  08/20/17 (!) 306 lb 11.2 oz (139.1 kg)    Physical Exam Vitals signs and nursing note reviewed.  Constitutional:      General: He is awake. He is not in acute distress.    Appearance: He is well-developed. He is morbidly obese. He is not ill-appearing.  HENT:     Head: Normocephalic and atraumatic.     Right Ear: Hearing normal. No drainage.     Left Ear: Hearing normal. No drainage.  Eyes:  General: Lids are normal.        Right eye: No discharge.        Left eye: No discharge.     Conjunctiva/sclera: Conjunctivae normal.     Pupils: Pupils are equal, round, and reactive to light.  Neck:     Musculoskeletal: Normal range of motion and neck supple.     Thyroid: No thyromegaly.     Vascular: No carotid bruit.  Cardiovascular:     Rate and Rhythm: Normal rate and regular rhythm.     Heart sounds: Normal heart sounds, S1 normal and S2 normal. No murmur. No gallop.   Pulmonary:      Effort: Pulmonary effort is normal. No accessory muscle usage or respiratory distress.     Breath sounds: Wheezing present.     Comments: Scattered expiratory wheezes throughout, no rhonchi.  Intermittent nonproductive cough (patient reports this as his baseline with COPD).   Abdominal:     General: Bowel sounds are normal.     Palpations: Abdomen is soft.     Tenderness: There is no abdominal tenderness.  Musculoskeletal: Normal range of motion.     Right lower leg: 2+ Pitting Edema present.     Left lower leg: 2+ Pitting Edema present.  Skin:    General: Skin is warm and dry.     Capillary Refill: Capillary refill takes less than 2 seconds.     Findings: No rash.  Neurological:     Mental Status: He is alert and oriented to person, place, and time.     Deep Tendon Reflexes:     Reflex Scores:      Brachioradialis reflexes are 1+ on the right side and 1+ on the left side.      Patellar reflexes are 1+ on the right side and 1+ on the left side.    Comments: No tremor noted to on exam today.  Negative cogwheel bilaterally.  Normal affect and gait.  Psychiatric:        Attention and Perception: Attention normal.        Mood and Affect: Mood normal.        Speech: Speech normal.        Behavior: Behavior normal. Behavior is cooperative.        Thought Content: Thought content normal.        Judgment: Judgment normal.     Results for orders placed or performed in visit on 03/09/18  Hemoglobin A1c  Result Value Ref Range   Hemoglobin A1C 7.7       Assessment & Plan:   Problem List Items Addressed This Visit      Cardiovascular and Mediastinum   Hypertension - Primary    Chronic, ongoing.  Poor control, poor diet.  Edema present with Amlodipine, will discontinue this and increase Lasix at this time to 40 MG.  Labs today.  Continue Losartan, Clonidine, HCTZ, Carvedilol, and ASA.  Recommend continuing to check BP at home.  Return in 4 weeks.      Relevant Medications    furosemide (LASIX) 20 MG tablet   Other Relevant Orders   Comprehensive metabolic panel   B Nat Peptide     Other   Edema of both legs    Ongoing since starting Amlodipine.  Will discontinue at this time and increase Furosemide to 40 MG daily.  Is ongoing elevations in BP may need to adjust Clonidine dose.  Labs today.  Return in 4 weeks.  Relevant Orders   B Nat Peptide   Tremor    Per patient report to upper extremity L>R (appears mixed as notices at rest and when lifting items), did not note at visit today.  He would like referral to neurology.  Referral placed.      Relevant Orders   Ambulatory referral to Neurology       Follow up plan: Return in about 4 weeks (around 12/12/2018) for Edema.

## 2018-11-16 LAB — COMPREHENSIVE METABOLIC PANEL
ALT: 16 IU/L (ref 0–44)
AST: 12 IU/L (ref 0–40)
Albumin/Globulin Ratio: 1.5 (ref 1.2–2.2)
Albumin: 4 g/dL (ref 3.7–4.7)
Alkaline Phosphatase: 43 IU/L (ref 39–117)
BUN/Creatinine Ratio: 17 (ref 10–24)
BUN: 16 mg/dL (ref 8–27)
Bilirubin Total: 0.4 mg/dL (ref 0.0–1.2)
CO2: 26 mmol/L (ref 20–29)
Calcium: 9.8 mg/dL (ref 8.6–10.2)
Chloride: 102 mmol/L (ref 96–106)
Creatinine, Ser: 0.96 mg/dL (ref 0.76–1.27)
GFR calc Af Amer: 87 mL/min/{1.73_m2} (ref >59)
GFR calc non Af Amer: 75 mL/min/{1.73_m2} (ref >59)
Globulin, Total: 2.6 g/dL (ref 1.5–4.5)
Glucose: 140 mg/dL — ABNORMAL HIGH (ref 65–99)
Potassium: 4.7 mmol/L (ref 3.5–5.2)
Sodium: 142 mmol/L (ref 134–144)
Total Protein: 6.6 g/dL (ref 6.0–8.5)

## 2018-11-16 LAB — BRAIN NATRIURETIC PEPTIDE: BNP: 33.6 pg/mL (ref 0.0–100.0)

## 2018-11-16 NOTE — Progress Notes (Signed)
Normal test results noted.  Please call patient and make them aware of normal results and will continue to monitor at regular visits.  Have a great day.  Look forward to seeing you at your next visit.

## 2018-11-29 ENCOUNTER — Ambulatory Visit: Payer: Medicare Other | Admitting: Pharmacist

## 2018-11-29 DIAGNOSIS — I10 Essential (primary) hypertension: Secondary | ICD-10-CM

## 2018-11-29 DIAGNOSIS — R600 Localized edema: Secondary | ICD-10-CM

## 2018-11-29 NOTE — Chronic Care Management (AMB) (Signed)
Chronic Care Management   Follow Up Note   11/29/2018 Name: Justin Griffith MRN: [id number] DOB: 1940/08/11  Referred by: Guadalupe Maple, MD Reason for referral : Chronic Care Management (Medication Management)   Justin Griffith is a 78 y.o. year old male who is a primary care patient of Crissman, Jeannette How, MD. The CCM team was consulted for assistance with chronic disease management and care coordination needs.    Contacted patient to follow up on status of medication management.   Review of patient status, including review of consultants reports, relevant laboratory and other test results, and collaboration with appropriate care team members and the patient's provider was performed as part of comprehensive patient evaluation and provision of chronic care management services.    SDOH (Social Determinants of Health) screening performed today: None. See Care Plan for related entries.   Outpatient Encounter Medications as of 11/29/2018  Medication Sig Note  . carvedilol (COREG) 25 MG tablet Take 1 tablet (25 mg total) by mouth 2 (two) times daily with a meal.   . cloNIDine (CATAPRES) 0.1 MG tablet Take 1 tablet (0.1 mg total) by mouth 2 (two) times daily. (Patient taking differently: Take 0.2 mg by mouth 2 (two) times daily. )   . furosemide (LASIX) 20 MG tablet Take 2 tablets (40 mg total) by mouth daily. 11/29/2018: Taking 20 mg daily   . hydrochlorothiazide (HYDRODIURIL) 25 MG tablet Take 25 mg by mouth daily.   Marland Kitchen losartan (COZAAR) 100 MG tablet Take 100 mg by mouth daily.   Marland Kitchen acyclovir (ZOVIRAX) 400 MG tablet Take 400 mg by mouth 2 (two) times daily.   Marland Kitchen albuterol (PROVENTIL HFA) 108 (90 Base) MCG/ACT inhaler Inhale 1-2 puffs into the lungs every 4 (four) hours as needed.  09/02/2018: Using 2 puffs Q4H  . amLODipine (NORVASC) 10 MG tablet Take 10 mg by mouth daily.    Marland Kitchen aspirin EC 81 MG tablet Take 81 mg by mouth daily.    . clotrimazole-betamethasone (LOTRISONE) lotion Apply topically 2  (two) times daily. 09/02/2018: Using 1/month  . insulin glargine (LANTUS) 100 UNIT/ML injection Inject 75 Units into the skin daily.    . insulin regular (NOVOLIN R) 100 units/mL injection Inject 30 Units into the skin 3 (three) times daily before meals.    Marland Kitchen ipratropium-albuterol (DUONEB) 0.5-2.5 (3) MG/3ML SOLN Take 3 mLs by nebulization every 6 (six) hours.   . metFORMIN (GLUCOPHAGE) 500 MG tablet Take 1,000 mg by mouth 2 (two) times daily with a meal. Take 2 y mouth 2 times daily   . naproxen (NAPROSYN) 500 MG tablet Take 500 mg by mouth 2 (two) times daily with a meal.   . rosuvastatin (CRESTOR) 40 MG tablet Take 1 tablet (40 mg total) by mouth at bedtime. (Patient taking differently: Take 40 mg by mouth at bedtime. 20 mg once a day)   . tamsulosin (FLOMAX) 0.4 MG CAPS capsule Take 0.4 mg by mouth daily.    No facility-administered encounter medications on file as of 11/29/2018.      Goals Addressed            This Visit's Progress     Patient Stated   . "I have a lot of leg swelling" (pt-stated)       Current Barriers:  . Patient with hx LEE, most likely most impacted by hx PAD/R leg stenting, previously followed by [facility]. Saw Jolene [name] 11/14/2018, tried trial off amlodipine to see if improvement in LEE w/  increase in furosemide o Notes mild improvement in R foot, but none in L. Notes his "shins are sore", though that this is a chronic concern o Notes that he tried a few days of furosemide 40 mg daily, but was going to the bathroom "too often", so returned to 20 mg daily o Reports BP 150-160s/60-70s since d/c of amlodipine, though he notes that he doesn't trust his home meter due to rapid fluctuations on recheck . Reports that he had a phone appointment with his [place] clinician last week; no changes to chronic therapy  Pharmacist Clinical Goal(s):  Marland Kitchen Over the next 90 days, patient will work with multiple speciality teams to address needs related to concerns related  to lower extremity edema  Interventions: . Will collaborate with Marnee Guarneri, NP. Could consider restarting amlodipine at 5 mg daily d/t lack of LEE resolution w/ total discontinuation. Consider changing HCTZ to chlorthalidone for improved efficacy. He has follow up scheduled with Jolene on 12/14/2018 . Will encourage patient to bring BP meter to office for comparison, as he believes it to be inaccurate   Patient Self Care Activities:  . Self administers medications as prescribed . Attends all scheduled provider appointments . Calls provider office for new concerns or questions  Please see past updates related to this goal by clicking on the "Past Updates" button in the selected goal         Plan:  - Will collaborate with Marnee Guarneri as above, a she last saw patient for edema and will be seeing for follow up within the month  Catie Darnelle Maffucci, PharmD Clinical Pharmacist [place] [phone]

## 2018-11-29 NOTE — Patient Instructions (Signed)
Visit Information  Goals Addressed            This Visit's Progress     Patient Stated   . "I have a lot of leg swelling" (pt-stated)       Current Barriers:  . Patient with hx LEE, most likely most impacted by hx PAD/R leg stenting, previously followed by [facility]. Saw Jolene [name] 11/14/2018, tried trial off amlodipine to see if improvement in LEE w/ increase in furosemide o Notes mild improvement in R foot, but none in L. Notes his "shins are sore", though that this is a chronic concern o Notes that he tried a few days of furosemide 40 mg daily, but was going to the bathroom "too often", so returned to 20 mg daily o Reports BP 150-160s/60-70s since d/c of amlodipine, though he notes that he doesn't trust his home meter due to rapid fluctuations on recheck . Reports that he had a phone appointment with his [place] clinician last week; no changes to chronic therapy  Pharmacist Clinical Goal(s):  Marland Kitchen Over the next 90 days, patient will work with multiple speciality teams to address needs related to concerns related to lower extremity edema  Interventions: . Will collaborate with Marnee Guarneri, NP. Could consider restarting amlodipine at 5 mg daily d/t lack of LEE resolution w/ total discontinuation. Consider changing HCTZ to chlorthalidone for improved efficacy. He has follow up scheduled with Jolene on 12/14/2018 . Will encourage patient to bring BP meter to office for comparison, as he believes it to be inaccurate   Patient Self Care Activities:  . Self administers medications as prescribed . Attends all scheduled provider appointments . Calls provider office for new concerns or questions  Please see past updates related to this goal by clicking on the "Past Updates" button in the selected goal         The patient verbalized understanding of instructions provided today and declined a print copy of patient instruction materials.   Plan:  - Will collaborate with Marnee Guarneri as above, a she last saw patient for edema and will be seeing for follow up within the month  Catie Darnelle Maffucci, PharmD Clinical Pharmacist [place] [phone]

## 2018-11-30 ENCOUNTER — Telehealth: Payer: Self-pay

## 2018-11-30 ENCOUNTER — Other Ambulatory Visit: Payer: Self-pay

## 2018-11-30 DIAGNOSIS — Z85038 Personal history of other malignant neoplasm of large intestine: Secondary | ICD-10-CM

## 2018-11-30 NOTE — Telephone Encounter (Signed)
Gastroenterology Pre-Procedure Review  Request Date: 12/12/18 Requesting Physician: Dr. Vicente Males  PATIENT REVIEW QUESTIONS: The patient responded to the following health history questions as indicated:    1. Are you having any GI issues? no 2. Do you have a personal history of Polyps? Personal history of colon cancer 3. Do you have a family history of Colon Cancer or Polyps? no 4. Diabetes Mellitus? yes (type 2) 5. Joint replacements in the past 12 months?no 6. Major health problems in the past 3 months?emphysema 7. Any artificial heart valves, MVP, or defibrillator?no    MEDICATIONS & ALLERGIES:    Patient reports the following regarding taking any anticoagulation/antiplatelet therapy:   Plavix, Coumadin, Eliquis, Xarelto, Lovenox, Pradaxa, Brilinta, or Effient? no Aspirin? yes (81 mg daily)  Patient confirms/reports the following medications:  Current Outpatient Medications  Medication Sig Dispense Refill  . acyclovir (ZOVIRAX) 400 MG tablet Take 400 mg by mouth 2 (two) times daily.    Marland Kitchen albuterol (PROVENTIL HFA) 108 (90 Base) MCG/ACT inhaler Inhale 1-2 puffs into the lungs every 4 (four) hours as needed.     Marland Kitchen amLODipine (NORVASC) 10 MG tablet Take 10 mg by mouth daily.     Marland Kitchen aspirin EC 81 MG tablet Take 81 mg by mouth daily.     . carvedilol (COREG) 25 MG tablet Take 1 tablet (25 mg total) by mouth 2 (two) times daily with a meal. 180 tablet 4  . cloNIDine (CATAPRES) 0.1 MG tablet Take 1 tablet (0.1 mg total) by mouth 2 (two) times daily. (Patient taking differently: Take 0.2 mg by mouth 2 (two) times daily. ) 180 tablet 4  . clotrimazole-betamethasone (LOTRISONE) lotion Apply topically 2 (two) times daily. 30 mL 1  . furosemide (LASIX) 20 MG tablet Take 2 tablets (40 mg total) by mouth daily. 30 tablet 1  . hydrochlorothiazide (HYDRODIURIL) 25 MG tablet Take 25 mg by mouth daily.    . insulin glargine (LANTUS) 100 UNIT/ML injection Inject 75 Units into the skin daily.     . insulin  regular (NOVOLIN R) 100 units/mL injection Inject 30 Units into the skin 3 (three) times daily before meals.     Marland Kitchen ipratropium-albuterol (DUONEB) 0.5-2.5 (3) MG/3ML SOLN Take 3 mLs by nebulization every 6 (six) hours.    Marland Kitchen losartan (COZAAR) 100 MG tablet Take 100 mg by mouth daily.    . metFORMIN (GLUCOPHAGE) 500 MG tablet Take 1,000 mg by mouth 2 (two) times daily with a meal. Take 2 y mouth 2 times daily    . naproxen (NAPROSYN) 500 MG tablet Take 500 mg by mouth 2 (two) times daily with a meal.    . rosuvastatin (CRESTOR) 40 MG tablet Take 1 tablet (40 mg total) by mouth at bedtime. (Patient taking differently: Take 40 mg by mouth at bedtime. 20 mg once a day) 90 tablet 4  . tamsulosin (FLOMAX) 0.4 MG CAPS capsule Take 0.4 mg by mouth daily.     No current facility-administered medications for this visit.     Patient confirms/reports the following allergies:  Allergies  Allergen Reactions  . Farxiga [Dapagliflozin] Rash  . Jardiance [Empagliflozin] Rash    No orders of the defined types were placed in this encounter.   AUTHORIZATION INFORMATION Primary Insurance: 1D#: Group #:  Secondary Insurance: 1D#: Group #:  SCHEDULE INFORMATION: Date: 12/12/18 Time: Location:

## 2018-12-08 ENCOUNTER — Other Ambulatory Visit: Admission: RE | Admit: 2018-12-08 | Payer: Medicare Other | Source: Ambulatory Visit

## 2018-12-09 ENCOUNTER — Other Ambulatory Visit
Admission: RE | Admit: 2018-12-09 | Discharge: 2018-12-09 | Disposition: A | Discharge status: Home / Self Care | Payer: Medicare Other | Source: Ambulatory Visit | Attending: Gastroenterology | Admitting: Gastroenterology

## 2018-12-09 DIAGNOSIS — Z01812 Encounter for preprocedural laboratory examination: Secondary | ICD-10-CM | POA: Insufficient documentation

## 2018-12-09 DIAGNOSIS — Z20828 Contact with and (suspected) exposure to other viral communicable diseases: Secondary | ICD-10-CM | POA: Insufficient documentation

## 2018-12-09 LAB — SARS CORONAVIRUS 2 (TAT 6-24 HRS): SARS Coronavirus 2: NEGATIVE

## 2018-12-11 ENCOUNTER — Ambulatory Visit: Payer: Medicare Other | Admitting: Anesthesiology

## 2018-12-11 ENCOUNTER — Encounter: Payer: Self-pay | Admitting: Anesthesiology

## 2018-12-12 ENCOUNTER — Other Ambulatory Visit: Payer: Self-pay

## 2018-12-12 ENCOUNTER — Ambulatory Visit
Admission: RE | Admit: 2018-12-12 | Discharge: 2018-12-12 | Disposition: A | Discharge status: Home / Self Care | Payer: Medicare Other | Attending: Gastroenterology | Admitting: Gastroenterology

## 2018-12-12 ENCOUNTER — Encounter
Admission: RE | Disposition: A | Discharge status: Home / Self Care | Payer: Self-pay | Source: Home / Self Care | Attending: Gastroenterology

## 2018-12-12 ENCOUNTER — Encounter: Payer: Self-pay | Admitting: *Deleted

## 2018-12-12 DIAGNOSIS — Z5309 Procedure and treatment not carried out because of other contraindication: Secondary | ICD-10-CM | POA: Diagnosis not present

## 2018-12-12 DIAGNOSIS — I10 Essential (primary) hypertension: Secondary | ICD-10-CM | POA: Insufficient documentation

## 2018-12-12 DIAGNOSIS — G4730 Sleep apnea, unspecified: Secondary | ICD-10-CM | POA: Insufficient documentation

## 2018-12-12 DIAGNOSIS — E119 Type 2 diabetes mellitus without complications: Secondary | ICD-10-CM | POA: Insufficient documentation

## 2018-12-12 DIAGNOSIS — I739 Peripheral vascular disease, unspecified: Secondary | ICD-10-CM | POA: Insufficient documentation

## 2018-12-12 DIAGNOSIS — J449 Chronic obstructive pulmonary disease, unspecified: Secondary | ICD-10-CM | POA: Insufficient documentation

## 2018-12-12 DIAGNOSIS — Z85038 Personal history of other malignant neoplasm of large intestine: Secondary | ICD-10-CM | POA: Diagnosis not present

## 2018-12-12 DIAGNOSIS — Z87891 Personal history of nicotine dependence: Secondary | ICD-10-CM | POA: Insufficient documentation

## 2018-12-12 DIAGNOSIS — K219 Gastro-esophageal reflux disease without esophagitis: Secondary | ICD-10-CM | POA: Diagnosis not present

## 2018-12-12 DIAGNOSIS — M1990 Unspecified osteoarthritis, unspecified site: Secondary | ICD-10-CM | POA: Insufficient documentation

## 2018-12-12 HISTORY — DX: Cardiac murmur, unspecified: R01.1

## 2018-12-12 LAB — GLUCOSE, CAPILLARY: Glucose-Capillary: 150 mg/dL — ABNORMAL HIGH (ref 70–99)

## 2018-12-12 SURGERY — COLONOSCOPY WITH PROPOFOL
Anesthesia: General

## 2018-12-12 MED ORDER — ONDANSETRON HCL 4 MG/2ML IJ SOLN
INTRAMUSCULAR | Status: AC
  Filled 2018-12-12: qty 2

## 2018-12-12 MED ORDER — SODIUM CHLORIDE 0.9 % IV SOLN
INTRAVENOUS | Status: DC
  Administered 2018-12-12: 10:00:00 1000 mL via INTRAVENOUS

## 2018-12-12 MED ORDER — IPRATROPIUM-ALBUTEROL 0.5-2.5 (3) MG/3ML IN SOLN
RESPIRATORY_TRACT | Status: AC
  Administered 2018-12-12: 3 mL
  Filled 2018-12-12: qty 3

## 2018-12-12 MED ORDER — PROPOFOL 500 MG/50ML IV EMUL
INTRAVENOUS | Status: AC
  Filled 2018-12-12: qty 50

## 2018-12-12 MED ORDER — MIDAZOLAM HCL 2 MG/2ML IJ SOLN
INTRAMUSCULAR | Status: AC
  Filled 2018-12-12: qty 2

## 2018-12-12 MED ORDER — LIDOCAINE HCL (PF) 2 % IJ SOLN
INTRAMUSCULAR | Status: AC
  Filled 2018-12-12: qty 10

## 2018-12-12 NOTE — OR Nursing (Signed)
Dr. Marcello Moores and Dr Vicente Males consulted and cancelled the procedure.  It was advised the pt go to ED for further evaluation but pt did not want to.  Dr. Vicente Males said if his O2 saturation returned to baseline on roomair he could go home.  It returned to 93-94'/,.  Advised pt to go to ED if breathing worsens.  Pt understood and left walking on own.

## 2018-12-12 NOTE — Anesthesia Preprocedure Evaluation (Signed)
Anesthesia Evaluation  Patient identified by MRN, date of birth, ID band Patient awake    Reviewed: Allergy & Precautions, NPO status , Patient's Chart, lab work & pertinent test results, reviewed documented beta blocker date and time   Airway Mallampati: III  TM Distance: >3 FB     Dental  (+) Chipped   Pulmonary asthma , sleep apnea , COPD, former smoker,           Cardiovascular hypertension, Pt. on medications and Pt. on home beta blockers + Peripheral Vascular Disease  + Valvular Problems/Murmurs      Neuro/Psych    GI/Hepatic GERD  ,  Endo/Other  diabetes, Type 2  Renal/GU      Musculoskeletal  (+) Arthritis ,   Abdominal   Peds  Hematology   Anesthesia Other Findings Obese. Severe COPD.  Reproductive/Obstetrics                             Anesthesia Physical Anesthesia Plan  ASA: IV  Anesthesia Plan: General   Post-op Pain Management:    Induction: Intravenous  PONV Risk Score and Plan:   Airway Management Planned:   Additional Equipment:   Intra-op Plan:   Post-operative Plan:   Informed Consent: I have reviewed the patients History and Physical, chart, labs and discussed the procedure including the risks, benefits and alternatives for the proposed anesthesia with the patient or authorized representative who has indicated his/her understanding and acceptance.       Plan Discussed with: CRNA  Anesthesia Plan Comments:         Anesthesia Quick Evaluation

## 2018-12-12 NOTE — OR Nursing (Signed)
Pt slightly wheezy and having difficulty breathing with mask on and after getting on stretcher.  Asked to sit up.  Dr Marcello Moores ordered duoneb and it was administered.  Pt then requesting O2 mask.at 10L Pt doesn't appear to be comfortable.  Dr Marcello Moores notified.

## 2018-12-14 ENCOUNTER — Encounter: Payer: Self-pay | Admitting: Nurse Practitioner

## 2018-12-14 ENCOUNTER — Other Ambulatory Visit: Payer: Self-pay

## 2018-12-14 ENCOUNTER — Ambulatory Visit (INDEPENDENT_AMBULATORY_CARE_PROVIDER_SITE_OTHER): Payer: Medicare Other | Admitting: Nurse Practitioner

## 2018-12-14 VITALS — BP 146/70 | HR 59 | Temp 98.1°F | Ht 71.0 in | Wt 318.0 lb

## 2018-12-14 DIAGNOSIS — J449 Chronic obstructive pulmonary disease, unspecified: Secondary | ICD-10-CM

## 2018-12-14 DIAGNOSIS — Z23 Encounter for immunization: Secondary | ICD-10-CM

## 2018-12-14 DIAGNOSIS — I10 Essential (primary) hypertension: Secondary | ICD-10-CM

## 2018-12-14 MED ORDER — AMOXICILLIN-POT CLAVULANATE 875-125 MG PO TABS: 1.0000 | ORAL_TABLET | Freq: Two times a day (BID) | ORAL | 0 refills | Status: DC

## 2018-12-14 MED ORDER — AMLODIPINE BESYLATE 5 MG PO TABS: 5.0000 mg | ORAL_TABLET | Freq: Every day | ORAL | 3 refills | Status: DC

## 2018-12-14 NOTE — Progress Notes (Signed)
BP (!) 146/70 (BP Location: Left Arm, Patient Position: Sitting)   Pulse (!) 59   Temp 98.1 F (36.7 C) (Oral)   Ht 5\' 11"  (1.803 m)   Wt (!) 318 lb (144.2 kg)   SpO2 94%   BMI 44.35 kg/m    Subjective:    Patient ID: Justin Griffith, male    DOB: 1940-10-29, 78 y.o.   MRN: [id number]  HPI: Justin Griffith is a 78 y.o. male  Chief Complaint  Patient presents with  . Hypertension    4w f/u   HYPERTENSION At visit 11/14/2018 discontinued Amlodipine due to edema BLE and increased Lasix to 40 MG daily.  Continues on Losartan, Clonidine, HCTZ, Carvedilol, and ASA.  BNP at visit was 33.6.  Sees [name] for cardiology, sees him in June next year.  He reports improvement in edema with discontinuation of Amlodipine, was on 10 MG and reports he has never been on less of a dose of this.   Hypertension status: uncontrolled  Satisfied with current treatment? yes Duration of hypertension: chronic BP monitoring frequency:  daily BP range: 150 range SBP and 70-80 DBP BP medication side effects:  no Medication compliance: good compliance Previous BP meds: Losartan, Clonidine, HCTZ, Carvedilol, Amlodipine, Lasix Aspirin: yes Recurrent headaches: no Visual changes: no Palpitations: no Dyspnea:  At baseline Chest pain: no Lower extremity edema: mild, improved Dizzy/lightheaded: no   COPD Followed by [name], last seen 09/07/2018.  He added Duonebs and treated for exacerbation with Amoxicillin.  He reports he continues to cough up phlegm and recently had to have colonoscopy cancelled due to SOB.  Sees [name] again in November. COPD status: stable Satisfied with current treatment?: yes Oxygen use: no Dyspnea frequency: at baseline, had one episode of increased recently Cough frequency: present, chronic Rescue inhaler frequency:  Uses Duoneb QID Limitation of activity: at times Productive cough: at this time, over past week Last Spirometry: with pulmonary Pneumovax: Up to  Date Influenza: Up to Date  Relevant past medical, surgical, family and social history reviewed and updated as indicated. Interim medical history since our last visit reviewed. Allergies and medications reviewed and updated.  Review of Systems  Constitutional: Negative for activity change, diaphoresis, fatigue and fever.  Respiratory: Positive for cough, shortness of breath and wheezing. Negative for chest tightness.   Cardiovascular: Negative for chest pain, palpitations and leg swelling.  Gastrointestinal: Negative for abdominal distention, abdominal pain, constipation, diarrhea, nausea and vomiting.  Neurological: Negative for dizziness, syncope, weakness, light-headedness, numbness and headaches.  Psychiatric/Behavioral: Negative.     Per HPI unless specifically indicated above     Objective:    BP (!) 146/70 (BP Location: Left Arm, Patient Position: Sitting)   Pulse (!) 59   Temp 98.1 F (36.7 C) (Oral)   Ht 5\' 11"  (1.803 m)   Wt (!) 318 lb (144.2 kg)   SpO2 94%   BMI 44.35 kg/m   Wt Readings from Last 3 Encounters:  12/14/18 (!) 318 lb (144.2 kg)  12/12/18 (!) 304 lb (137.9 kg)  08/22/18 (!) 304 lb (137.9 kg)    Physical Exam Vitals signs and nursing note reviewed.  Constitutional:      General: He is awake. He is not in acute distress.    Appearance: He is well-developed. He is morbidly obese. He is not ill-appearing.  HENT:     Head: Normocephalic and atraumatic.     Right Ear: Hearing normal. No drainage.  Left Ear: Hearing normal. No drainage.  Eyes:     General: Lids are normal.        Right eye: No discharge.        Left eye: No discharge.     Conjunctiva/sclera: Conjunctivae normal.     Pupils: Pupils are equal, round, and reactive to light.  Neck:     Musculoskeletal: Normal range of motion and neck supple.     Vascular: No carotid bruit.  Cardiovascular:     Rate and Rhythm: Normal rate and regular rhythm.     Heart sounds: Normal heart sounds,  S1 normal and S2 normal. No murmur. No gallop.   Pulmonary:     Effort: Pulmonary effort is normal. No accessory muscle usage or respiratory distress.     Breath sounds: Wheezing present.     Comments: Expiratory wheezes noted throughout on exam and productive cough present.  No SOB with talking. Abdominal:     General: Bowel sounds are normal.     Palpations: Abdomen is soft.  Musculoskeletal: Normal range of motion.     Right lower leg: 1+ Pitting Edema present.     Left lower leg: 1+ Pitting Edema present.  Skin:    General: Skin is warm and dry.  Neurological:     Mental Status: He is alert and oriented to person, place, and time.  Psychiatric:        Mood and Affect: Mood normal.        Behavior: Behavior normal. Behavior is cooperative.        Thought Content: Thought content normal.        Judgment: Judgment normal.     Results for orders placed or performed during the hospital encounter of 12/12/18  Glucose, capillary  Result Value Ref Range   Glucose-Capillary 150 (H) 70 - 99 mg/dL      Assessment & Plan:   Problem List Items Addressed This Visit      Cardiovascular and Mediastinum   Hypertension    Chronic, ongoing.  Slightly above goal in office and at home.  Will trial lower dose of Amlodipine at 5 MG and assess for swelling, has underlying PVD and have recommended he wear compression hose at home + decrease sodium intake.  Continue remainder of medication regimen.  Continue to check BP daily at home and document.  Return in 4 weeks.  CMP today.      Relevant Medications   amLODipine (NORVASC) 5 MG tablet   Other Relevant Orders   Comprehensive metabolic panel     Respiratory   COPD (chronic obstructive pulmonary disease) (HCC) - Primary    Chronic, ongoing.  Followed by pulmonary.  Continue current medication regimen.  Will send in script for Augmentin today due to increased cough and wheezing.  He declines imaging.  Will hold off on Prednisone due to his  diabetes, if no improvement will provide short burst of this.  Continue Mucinex at home.  Return in 4 weeks or sooner if worsening.       Other Visit Diagnoses    Flu vaccine need       Relevant Orders   Flu Vaccine QUAD High Dose(Fluad) (Completed)       Follow up plan: Return in about 4 weeks (around 01/11/2019) for HTN follow-up.

## 2018-12-14 NOTE — Assessment & Plan Note (Signed)
Chronic, ongoing.  Slightly above goal in office and at home.  Will trial lower dose of Amlodipine at 5 MG and assess for swelling, has underlying PVD and have recommended he wear compression hose at home + decrease sodium intake.  Continue remainder of medication regimen.  Continue to check BP daily at home and document.  Return in 4 weeks.  CMP today.

## 2018-12-14 NOTE — Patient Instructions (Signed)
Chronic Obstructive Pulmonary Disease °Chronic obstructive pulmonary disease (COPD) is a long-term (chronic) lung problem. When you have COPD, it is hard for air to get in and out of your lungs. Usually the condition gets worse over time, and your lungs will never return to normal. There are things you can do to keep yourself as healthy as possible. °· Your doctor may treat your condition with: °? Medicines. °? Oxygen. °? Lung surgery. °· Your doctor may also recommend: °? Rehabilitation. This includes steps to make your body work better. It may involve a team of specialists. °? Quitting smoking, if you smoke. °? Exercise and changes to your diet. °? Comfort measures (palliative care). °Follow these instructions at home: °Medicines °· Take over-the-counter and prescription medicines only as told by your doctor. °· Talk to your doctor before taking any cough or allergy medicines. You may need to avoid medicines that cause your lungs to be dry. °Lifestyle °· If you smoke, stop. Smoking makes the problem worse. If you need help quitting, ask your doctor. °· Avoid being around things that make your breathing worse. This may include smoke, chemicals, and fumes. °· Stay active, but remember to rest as well. °· Learn and use tips on how to relax. °· Make sure you get enough sleep. Most adults need at least 7 hours of sleep every night. °· Eat healthy foods. Eat smaller meals more often. Rest before meals. °Controlled breathing °Learn and use tips on how to control your breathing as told by your doctor. Try: °· Breathing in (inhaling) through your nose for 1 second. Then, pucker your lips and breath out (exhale) through your lips for 2 seconds. °· Putting one hand on your belly (abdomen). Breathe in slowly through your nose for 1 second. Your hand on your belly should move out. Pucker your lips and breathe out slowly through your lips. Your hand on your belly should move in as you breathe out. ° °Controlled coughing °Learn  and use controlled coughing to clear mucus from your lungs. Follow these steps: °1. Lean your head a little forward. °2. Breathe in deeply. °3. Try to hold your breath for 3 seconds. °4. Keep your mouth slightly open while coughing 2 times. °5. Spit any mucus out into a tissue. °6. Rest and do the steps again 1 or 2 times as needed. °General instructions °· Make sure you get all the shots (vaccines) that your doctor recommends. Ask your doctor about a flu shot and a pneumonia shot. °· Use oxygen therapy and pulmonary rehabilitation if told by your doctor. If you need home oxygen therapy, ask your doctor if you should buy a tool to measure your oxygen level (oximeter). °· Make a COPD action plan with your doctor. This helps you to know what to do if you feel worse than usual. °· Manage any other conditions you have as told by your doctor. °· Avoid going outside when it is very hot, cold, or humid. °· Avoid people who have a sickness you can catch (contagious). °· Keep all follow-up visits as told by your doctor. This is important. °Contact a doctor if: °· You cough up more mucus than usual. °· There is a change in the color or thickness of the mucus. °· It is harder to breathe than usual. °· Your breathing is faster than usual. °· You have trouble sleeping. °· You need to use your medicines more often than usual. °· You have trouble doing your normal activities such as getting dressed   or walking around the house. °Get help right away if: °· You have shortness of breath while resting. °· You have shortness of breath that stops you from: °? Being able to talk. °? Doing normal activities. °· Your chest hurts for longer than 5 minutes. °· Your skin color is more blue than usual. °· Your pulse oximeter shows that you have low oxygen for longer than 5 minutes. °· You have a fever. °· You feel too tired to breathe normally. °Summary °· Chronic obstructive pulmonary disease (COPD) is a long-term lung problem. °· The way your  lungs work will never return to normal. Usually the condition gets worse over time. There are things you can do to keep yourself as healthy as possible. °· Take over-the-counter and prescription medicines only as told by your doctor. °· If you smoke, stop. Smoking makes the problem worse. °This information is not intended to replace advice given to you by your health care provider. Make sure you discuss any questions you have with your health care provider. °Document Released: 08/26/2007 Document Revised: 02/19/2017 Document Reviewed: 04/13/2016 °Elsevier Patient Education © 2020 [employer] ° °

## 2018-12-14 NOTE — Assessment & Plan Note (Signed)
Chronic, ongoing.  Followed by pulmonary.  Continue current medication regimen.  Will send in script for Augmentin today due to increased cough and wheezing.  He declines imaging.  Will hold off on Prednisone due to his diabetes, if no improvement will provide short burst of this.  Continue Mucinex at home.  Return in 4 weeks or sooner if worsening.

## 2018-12-15 LAB — COMPREHENSIVE METABOLIC PANEL
ALT: 30 IU/L (ref 0–44)
AST: 26 IU/L (ref 0–40)
Albumin/Globulin Ratio: 1.4 (ref 1.2–2.2)
Albumin: 3.9 g/dL (ref 3.7–4.7)
Alkaline Phosphatase: 58 IU/L (ref 39–117)
BUN/Creatinine Ratio: 14 (ref 10–24)
BUN: 15 mg/dL (ref 8–27)
Bilirubin Total: 0.4 mg/dL (ref 0.0–1.2)
CO2: 27 mmol/L (ref 20–29)
Calcium: 10.6 mg/dL — ABNORMAL HIGH (ref 8.6–10.2)
Chloride: 100 mmol/L (ref 96–106)
Creatinine, Ser: 1.06 mg/dL (ref 0.76–1.27)
GFR calc Af Amer: 77 mL/min/{1.73_m2} (ref >59)
GFR calc non Af Amer: 67 mL/min/{1.73_m2} (ref >59)
Globulin, Total: 2.7 g/dL (ref 1.5–4.5)
Glucose: 125 mg/dL — ABNORMAL HIGH (ref 65–99)
Potassium: 4.3 mmol/L (ref 3.5–5.2)
Sodium: 143 mmol/L (ref 134–144)
Total Protein: 6.6 g/dL (ref 6.0–8.5)

## 2018-12-23 ENCOUNTER — Other Ambulatory Visit: Payer: Self-pay | Admitting: Nurse Practitioner

## 2018-12-23 NOTE — Telephone Encounter (Signed)
Routing to provider  

## 2018-12-23 NOTE — Telephone Encounter (Signed)
Medication Refill - Medication: amoxicillin-clavulanate (AUGMENTIN) 875-125 MG tablet  Pt stated the medication is working and he has one tablet left. He would like to request a refill.  Has the patient contacted their pharmacy? No. (Agent: If no, request that the patient contact the pharmacy for the refill.) (Agent: If yes, when and what did the pharmacy advise?)  Preferred Pharmacy (with phone number or street name):  [place], [place] [phone] (Phone) [phone] (Fax)     Agent: Please be advised that RX refills may take up to 3 business days. We ask that you follow-up with your pharmacy.

## 2018-12-23 NOTE — Telephone Encounter (Signed)
Requested medication (s) are due for refill today: yes  Requested medication (s) are on the active medication list: yes  Last refill:  12/14/2018  Future visit scheduled: yes  Notes to clinic:  Refill cannot be delegated    Requested Prescriptions  Pending Prescriptions Disp Refills   amoxicillin-clavulanate (AUGMENTIN) 875-125 MG tablet 20 tablet 0    Sig: Take 1 tablet by mouth 2 (two) times daily for 10 days.     Off-Protocol Failed - 12/23/2018  2:40 PM      Failed - Medication not assigned to a protocol, review manually.      Passed - Valid encounter within last 12 months    Recent Outpatient Visits          1 week ago Chronic obstructive pulmonary disease, unspecified COPD type ([place])   V[place], [name], NP   1 month ago Essential hypertension   [place], [name], NP   9 months ago No-show for appointment   [facility] Guadalupe Maple, MD   1 year ago Essential hypertension   [place], Jeannette How, MD   1 year ago Chronic obstructive pulmonary disease, unspecified COPD type [facility])   [name] Cr[place], Jeannette How, MD      Future Appointments            In 2 weeks [name], [name], NP [employer]

## 2018-12-25 MED ORDER — AMOXICILLIN-POT CLAVULANATE 875-125 MG PO TABS: 1.0000 | ORAL_TABLET | Freq: Two times a day (BID) | ORAL | 0 refills | Status: AC

## 2019-01-11 ENCOUNTER — Ambulatory Visit
Admission: RE | Admit: 2019-01-11 | Discharge: 2019-01-11 | Disposition: A | Discharge status: Home / Self Care | Payer: Medicare Other | Attending: Nurse Practitioner | Admitting: Nurse Practitioner

## 2019-01-11 ENCOUNTER — Ambulatory Visit
Admission: RE | Admit: 2019-01-11 | Discharge: 2019-01-11 | Disposition: A | Discharge status: Home / Self Care | Payer: Medicare Other | Source: Ambulatory Visit | Attending: Nurse Practitioner | Admitting: Nurse Practitioner

## 2019-01-11 ENCOUNTER — Other Ambulatory Visit: Payer: Self-pay

## 2019-01-11 ENCOUNTER — Ambulatory Visit (INDEPENDENT_AMBULATORY_CARE_PROVIDER_SITE_OTHER): Payer: Medicare Other | Admitting: Nurse Practitioner

## 2019-01-11 ENCOUNTER — Ambulatory Visit (INDEPENDENT_AMBULATORY_CARE_PROVIDER_SITE_OTHER): Payer: Medicare Other | Admitting: Pharmacist

## 2019-01-11 ENCOUNTER — Encounter: Payer: Self-pay | Admitting: Nurse Practitioner

## 2019-01-11 VITALS — BP 132/77 | HR 85 | Temp 98.4°F | Ht 71.0 in | Wt 308.0 lb

## 2019-01-11 DIAGNOSIS — J441 Chronic obstructive pulmonary disease with (acute) exacerbation: Secondary | ICD-10-CM | POA: Insufficient documentation

## 2019-01-11 DIAGNOSIS — R05 Cough: Secondary | ICD-10-CM | POA: Diagnosis not present

## 2019-01-11 DIAGNOSIS — E119 Type 2 diabetes mellitus without complications: Secondary | ICD-10-CM

## 2019-01-11 DIAGNOSIS — J449 Chronic obstructive pulmonary disease, unspecified: Secondary | ICD-10-CM

## 2019-01-11 DIAGNOSIS — I10 Essential (primary) hypertension: Secondary | ICD-10-CM | POA: Diagnosis not present

## 2019-01-11 MED ORDER — PREDNISONE 20 MG PO TABS: 40.0000 mg | ORAL_TABLET | Freq: Every day | ORAL | 0 refills | Status: DC

## 2019-01-11 MED ORDER — AZITHROMYCIN 250 MG PO TABS: ORAL_TABLET | ORAL | 0 refills | Status: DC

## 2019-01-11 MED ORDER — DOXYCYCLINE HYCLATE 100 MG PO TABS: 100.0000 mg | ORAL_TABLET | Freq: Two times a day (BID) | ORAL | 0 refills | Status: DC

## 2019-01-11 MED ORDER — PREDNISONE 20 MG PO TABS: 40.0000 mg | ORAL_TABLET | Freq: Every day | ORAL | 0 refills | Status: AC

## 2019-01-11 NOTE — Patient Instructions (Signed)
Justin Griffith, Justin Griffith, Justin Griffith [id number] (IMAGING)   [place] for Chronic Obstructive Pulmonary Disease Chronic obstructive pulmonary disease (COPD) causes symptoms such as shortness of breath, coughing, and chest discomfort. These symptoms can make it difficult to eat enough to maintain a healthy weight. Generally, people with COPD should eat a diet that is high in calories, protein, and other nutrients to maintain body weight and to keep the lungs as healthy as possible. Depending on the medicines you take and other health conditions you may have, your health care provider may give you additional recommendations on what to eat or avoid. Talk with your health care provider about your goals for body weight, and work with a dietitian to develop an eating plan that is right for you. What are tips for following this plan? Reading food labels   Avoid foods with more than 300 milligrams (mg) of salt (sodium) per serving.  Choose foods that contain at least 4 grams (g) of fiber per serving. Try to eat 20-30 g of fiber each day.  Choose foods that are high in calories and protein, such as nuts, beans, yogurt, and cheese. Shopping  Do not buy foods labeled as diet, low-calorie, or low-fat.  If you are able to eat dairy products: ? Avoid low-fat or skim milk. ? Buy dairy products that have at least 2% fat.  Buy nutritional supplement drinks.  Buy grains and prepared foods labeled as enriched or fortified.  Consider buying low-sodium, pre-made foods to conserve energy for eating. Cooking  Add dry milk or protein powder to smoothies.  Cook with healthy fats, such as olive oil, canola oil, sunflower oil, and grapeseed oil.  Add oil, butter, cream cheese, or nut butters to foods to increase fat and calories.  To make foods easier to chew and swallow: ? Cook vegetables, pasta, and rice until soft. ? Cut or grind meat into very small pieces. ? Dip breads in liquid. Meal planning    Eat when you feel hungry.  Eat 5-6 small meals throughout the day.  Drink 6-8 glasses of water each day.  Do not drink liquids with meals. Drink liquids at the end of the meal to avoid feeling full too quickly.  Eat a variety of fruits and vegetables every day.  Ask for assistance from family or friends with planning and preparing meals as needed.  Avoid foods that cause you to feel bloated, such as carbonated drinks, fried foods, beans, broccoli, cabbage, and apples.  For older adults, ask your local agency on aging whether you are eligible for meal assistance programs, such as Meals on Wheels. Lifestyle   Do not smoke.  Eat slowly. Take small bites and chew food well before swallowing.  Do not overeat. This may make it more difficult to breathe after eating.  Sit up while eating.  If needed, continue to use supplemental oxygen while eating.  Rest or relax for 30 minutes before and after eating.  Monitor your weight as told by your health care provider.  Exercise as told by your health care provider. What foods can I eat? Fruits All fresh, dried, canned, or frozen fruits that do not cause gas. Vegetables All fresh, canned (no salt added), or frozen vegetables that do not cause gas. Grains Whole grain bread. Enriched whole grain pasta. Fortified whole grain cereals. Fortified rice. Quinoa. Meats and other proteins Lean meat. Poultry. Fish. Dried beans. Unsalted nuts. Tofu. Eggs. Nut butters. Dairy Whole or 2% milk. Cheese. Yogurt. Fats  and oils Olive oil. Canola oil. Butter. Margarine. Beverages Water. Vegetable juice (no salt added). Decaffeinated coffee. Decaffeinated or herbal tea. Seasonings and condiments Fresh or dried herbs. Low-salt or salt-free seasonings. Low-sodium soy sauce. The items listed above may not be a complete list of foods and beverages you can eat. Contact a dietitian for more information. What foods are not recommended? Fruits Fruits  that cause gas, such as apples or melon. Vegetables Vegetables that cause gas, such as broccoli, Brussels sprouts, cabbage, cauliflower, and onions. Canned vegetables with added salt. Meats and other proteins Fried meat. Salt-cured meat. Processed meat. Dairy Fat-free or low-fat milk, yogurt, or cheese. Processed cheese. Beverages Carbonated drinks. Caffeinated drinks, such as coffee, tea, and soft drinks. Juice. Alcohol. Vegetable juice with added salt. Seasonings and condiments Salt. Seasoning mixes with salt. Soy sauce. Justin Griffith. Other foods Clear soup or broth. Fried foods. Prepared frozen meals. The items listed above may not be a complete list of foods and beverages you should avoid. Contact a dietitian for more information. Summary  COPD symptoms can make it difficult to eat enough to maintain a healthy weight.  A COPD eating plan can help you maintain your body weight and keep your lungs as healthy as possible.  Eat a diet that is high in calories, protein, and other nutrients. Read labels to make sure that you are getting the right nutrients. Cook foods to make them easy to chew and swallow.  Eat 5-6 small meals throughout the day, and avoid foods that cause gas or make you feel bloated. This information is not intended to replace advice given to you by your health care provider. Make sure you discuss any questions you have with your health care provider. Document Released: 05/25/2017 Document Revised: 06/30/2018 Document Reviewed: 05/25/2017 Elsevier Patient Education  2020 [employer].

## 2019-01-11 NOTE — Assessment & Plan Note (Addendum)
Acute with ongoing cough, will obtain imaging.  CBC and CMP today. Minimal improvement with Augmentin.  Obtain imaging.  Scripts for Doxycycline, Azithromycin, and Prednisone sent.  Has scheduled follow-up with pulmonary next week. Will have him return in 2 weeks for follow-up in this office.  Continue current inhaler regimen at this time.

## 2019-01-11 NOTE — Assessment & Plan Note (Signed)
Chronic, ongoing.  Improved with low dose Amlodipine. Has underlying PVD and have recommended he wear compression hose at home + decrease sodium intake.  Continue remainder of medication regimen.  Continue to check BP daily at home and document.

## 2019-01-11 NOTE — Progress Notes (Signed)
BP 132/77   Pulse 85   Temp 98.4 F (36.9 C) (Oral)   Ht 5\' 11"  (1.803 m)   Wt (!) 308 lb (139.7 kg)   SpO2 94%   BMI 42.96 kg/m    Subjective:    Patient ID: Justin Griffith, male    DOB: 1940/06/20, 78 y.o.   MRN: [id number]  HPI: Justin Griffith is a 78 y.o. male  Chief Complaint  Patient presents with  . Hypertension    4w f/u   HYPERTENSION Started on lower dose of Amlodipine last visit, which he is tolerating well.   Hypertension status: stable  Satisfied with current treatment? yes Duration of hypertension: chronic BP monitoring frequency:  a few times a week BP range:  121-140/70-80 BP medication side effects:  no Medication compliance: good compliance Aspirin: yes Recurrent headaches: no Visual changes: no Palpitations: no Dyspnea: no Chest pain: no Lower extremity edema: yes, at baseline with no change Dizzy/lightheaded: no   COPD Was treated with antibiotic at last visit, Augmentin, which improved cough for a short period, but now has returned with productive cough.  Thick yellow, phlegm.  When he sleeps, he sleeps sitting up in chair, but has been sleeping this way for 12 years.  Does wear CPAP at home, reports 100% use.  Is followed by pulmonary and sees them Wednesday or Thursday next week.  Not current smoker, quit in 1992 or 1993. COPD status: exacerbated Satisfied with current treatment?: yes Oxygen use: no Dyspnea frequency: increased at this time, states can walk a couple blocks before SOB present Cough frequency: increased at this time time Rescue inhaler frequency: not using inhaler often, but using nebulizer  Limitation of activity: no Productive cough: yes Last Spirometry: with [name] Pneumovax: Up to Date Influenza: Up to Date  Relevant past medical, surgical, family and social history reviewed and updated as indicated. Interim medical history since our last visit reviewed. Allergies and medications reviewed and updated.  Review of  Systems  Constitutional: Positive for chills and fatigue. Negative for activity change, diaphoresis and fever.  HENT: Negative for congestion, postnasal drip and rhinorrhea.   Respiratory: Positive for cough and wheezing. Negative for chest tightness.   Cardiovascular: Negative for chest pain, palpitations and leg swelling (at baseline, no worse).  Gastrointestinal: Negative for abdominal distention, abdominal pain, constipation, diarrhea, nausea and vomiting.  Endocrine: Negative for cold intolerance, heat intolerance, polydipsia, polyphagia and polyuria.  Musculoskeletal: Negative.   Skin: Negative.   Neurological: Negative for dizziness, syncope, weakness, light-headedness, numbness and headaches.  Psychiatric/Behavioral: Negative.     Per HPI unless specifically indicated above     Objective:    BP 132/77   Pulse 85   Temp 98.4 F (36.9 C) (Oral)   Ht 5\' 11"  (1.803 m)   Wt (!) 308 lb (139.7 kg)   SpO2 94%   BMI 42.96 kg/m   Wt Readings from Last 3 Encounters:  01/11/19 (!) 308 lb (139.7 kg)  12/14/18 (!) 318 lb (144.2 kg)  12/12/18 (!) 304 lb (137.9 kg)    Physical Exam Vitals signs and nursing note reviewed.  Constitutional:      General: He is awake. He is not in acute distress.    Appearance: He is well-developed. He is morbidly obese. He is not ill-appearing.  HENT:     Head: Normocephalic and atraumatic.     Right Ear: Hearing, tympanic membrane, ear canal and external ear normal. No drainage.  Left Ear: Hearing, tympanic membrane, ear canal and external ear normal. No drainage.     Mouth/Throat:     Pharynx: Uvula midline. No pharyngeal swelling, oropharyngeal exudate or posterior oropharyngeal erythema.  Eyes:     General: Lids are normal.        Right eye: No discharge.        Left eye: No discharge.     Conjunctiva/sclera: Conjunctivae normal.     Pupils: Pupils are equal, round, and reactive to light.  Neck:     Musculoskeletal: Normal range of  motion and neck supple.     Thyroid: No thyromegaly.     Vascular: No carotid bruit or JVD.  Cardiovascular:     Rate and Rhythm: Normal rate and regular rhythm.     Heart sounds: Normal heart sounds, S1 normal and S2 normal. No murmur. No gallop.   Pulmonary:     Effort: Pulmonary effort is normal. No accessory muscle usage or respiratory distress.     Breath sounds: Decreased breath sounds and wheezing present.     Comments: Decreased breath sounds and wheezing noted throughout with intermittent cough.  No SOB with talking or when walked from office room to lab. Abdominal:     General: Bowel sounds are normal.     Palpations: Abdomen is soft. There is no hepatomegaly or splenomegaly.     Tenderness: There is no abdominal tenderness.  Musculoskeletal: Normal range of motion.     Right lower leg: 1+ Pitting Edema present.     Left lower leg: 1+ Pitting Edema present.  Lymphadenopathy:     Cervical: No cervical adenopathy.  Skin:    General: Skin is warm and dry.     Capillary Refill: Capillary refill takes less than 2 seconds.  Neurological:     Mental Status: He is alert and oriented to person, place, and time.     Deep Tendon Reflexes: Reflexes are normal and symmetric.  Psychiatric:        Mood and Affect: Mood normal.        Behavior: Behavior normal. Behavior is cooperative.        Thought Content: Thought content normal.        Judgment: Judgment normal.     Results for orders placed or performed in visit on 12/14/18  Comprehensive metabolic panel  Result Value Ref Range   Glucose 125 (H) 65 - 99 mg/dL   BUN 15 8 - 27 mg/dL   Creatinine, Ser 1.06 0.76 - 1.27 mg/dL   GFR calc non Af Amer 67 >59 mL/min/1.73   GFR calc Af Amer 77 >59 mL/min/1.73   BUN/Creatinine Ratio 14 10 - 24   Sodium 143 134 - 144 mmol/L   Potassium 4.3 3.5 - 5.2 mmol/L   Chloride 100 96 - 106 mmol/L   CO2 27 20 - 29 mmol/L   Calcium 10.6 (H) 8.6 - 10.2 mg/dL   Total Protein 6.6 6.0 - 8.5 g/dL    Albumin 3.9 3.7 - 4.7 g/dL   Globulin, Total 2.7 1.5 - 4.5 g/dL   Albumin/Globulin Ratio 1.4 1.2 - 2.2   Bilirubin Total 0.4 0.0 - 1.2 mg/dL   Alkaline Phosphatase 58 39 - 117 IU/L   AST 26 0 - 40 IU/L   ALT 30 0 - 44 IU/L      Assessment & Plan:   Problem List Items Addressed This Visit      Cardiovascular and Mediastinum   Hypertension  Chronic, ongoing.  Improved with low dose Amlodipine. Has underlying PVD and have recommended he wear compression hose at home + decrease sodium intake.  Continue remainder of medication regimen.  Continue to check BP daily at home and document.          Respiratory   COPD with acute exacerbation ([place]) - Primary    Acute with ongoing cough, will obtain imaging.  CBC and CMP today. Minimal improvement with Augmentin.  Obtain imaging.  Scripts for Doxycycline, Azithromycin, and Prednisone sent.  Has scheduled follow-up with pulmonary next week. Will have him return in 2 weeks for follow-up in this office.  Continue current inhaler regimen at this time.        Relevant Medications   predniSONE (DELTASONE) 20 MG tablet   azithromycin (ZITHROMAX) 250 MG tablet   Other Relevant Orders   DG Chest 2 View   CBC with Differential/Platelet   Comprehensive metabolic panel       Follow up plan: Return in about 2 weeks (around 01/25/2019) for COPD exacerbation.

## 2019-01-11 NOTE — Chronic Care Management (AMB) (Signed)
Chronic Care Management   Follow Up Note   01/11/2019 Name: Justin Griffith MRN: [id number] DOB: [birth year]  Referred by: Guadalupe Maple, MD Reason for referral : Chronic Care Management (Medication Management)   Justin Griffith is a 78 y.o. year old male who is a primary care patient of Crissman, Jeannette How, MD. The CCM team was consulted for assistance with chronic disease management and care coordination needs.   Met with patient face to face after appointment w/ Marnee Guarneri toda.y    Review of patient status, including review of consultants reports, relevant laboratory and other test results, and collaboration with appropriate care team members and the patient's provider was performed as part of comprehensive patient evaluation and provision of chronic care management services.    SDOH (Social Determinants of Health) screening performed today: None. See Care Plan for related entries.   Advanced Directives Status: N See Care Plan and Vynca application for related entries.  Outpatient Encounter Medications as of 01/11/2019  Medication Sig Note  . insulin glargine (LANTUS) 100 UNIT/ML injection Inject 75 Units into the skin daily.  01/11/2019: 80  . insulin regular (NOVOLIN R) 100 units/mL injection Inject 30 Units into the skin 3 (three) times daily before meals.  01/11/2019: 20-30  . metFORMIN (GLUCOPHAGE) 500 MG tablet Take 1,000 mg by mouth 2 (two) times daily with a meal. Take 2 y mouth 2 times daily   . acyclovir (ZOVIRAX) 400 MG tablet Take 400 mg by mouth 2 (two) times daily.   Marland Kitchen albuterol (PROVENTIL HFA) 108 (90 Base) MCG/ACT inhaler Inhale 1-2 puffs into the lungs every 4 (four) hours as needed.  09/02/2018: Using 2 puffs Q4H  . amLODipine (NORVASC) 5 MG tablet Take 1 tablet (5 mg total) by mouth daily.   Marland Kitchen aspirin EC 81 MG tablet Take 81 mg by mouth daily.    . carvedilol (COREG) 25 MG tablet Take 1 tablet (25 mg total) by mouth 2 (two) times daily with a meal.   . cloNIDine  (CATAPRES) 0.1 MG tablet Take 1 tablet (0.1 mg total) by mouth 2 (two) times daily. (Patient taking differently: Take 0.2 mg by mouth 2 (two) times daily. )   . clotrimazole-betamethasone (LOTRISONE) lotion Apply topically 2 (two) times daily. 09/02/2018: Using 1/month  . furosemide (LASIX) 20 MG tablet Take 2 tablets (40 mg total) by mouth daily. 11/29/2018: Taking 20 mg daily   . hydrochlorothiazide (HYDRODIURIL) 25 MG tablet Take 25 mg by mouth daily.   Marland Kitchen ipratropium-albuterol (DUONEB) 0.5-2.5 (3) MG/3ML SOLN Take 3 mLs by nebulization every 6 (six) hours.   Marland Kitchen losartan (COZAAR) 100 MG tablet Take 100 mg by mouth daily.   . naproxen (NAPROSYN) 500 MG tablet Take 500 mg by mouth 2 (two) times daily with a meal.   . rosuvastatin (CRESTOR) 40 MG tablet Take 1 tablet (40 mg total) by mouth at bedtime. (Patient taking differently: Take 40 mg by mouth at bedtime. 20 mg once a day)   . tamsulosin (FLOMAX) 0.4 MG CAPS capsule Take 0.4 mg by mouth daily.   . [DISCONTINUED] azithromycin (ZITHROMAX) 250 MG tablet Take 2 tablets by mouth (500 MG) on day 1 and then 1 tablet (250 MG) on days 2 to 5.   . [DISCONTINUED] doxycycline (VIBRA-TABS) 100 MG tablet Take 1 tablet (100 mg total) by mouth 2 (two) times daily.   . [DISCONTINUED] predniSONE (DELTASONE) 20 MG tablet Take 2 tablets (40 mg total) by mouth daily with breakfast for 5  days.    No facility-administered encounter medications on file as of 01/11/2019.      Goals Addressed            This Visit's Progress     Patient Stated   . "I don't know much about my diabetes" (pt-stated)       Current Barriers:  . Diabetes: uncontrolled; most recent A1c 8.5%; [facility] ([name]) . Current antihyperglycemic regimen: Lantus 80 units QAM and Novolin R 25-30 units TID with meals; metformin 1000 mg BID ? Hx significant GI upset and intolerance with Victoza and Trulicity ? Hx allergic rash to [place] ? Notes that he was previously on Humalog and it "made my ankles swell" ? Notes that Lantus "works better" than Toujeo for him . Reports 1 episode of hypoglycemia recently, treated w/ candy . Cardiovascular risk reduction: o Current hypertensive regimen: amlodipine 5 mg daily, carvedilol 25 mg BID, clonidine 0.1 BID, HCTZ 25 mg, losartan 100 mg daily; BP generally well controlled in clinic o Current hyperlipidemia regimen: rosuvastatin 40 mg daily; LDL at goal <70 on last check per Care Everywhere  Pharmacist Clinical Goal(s):  Marland Kitchen Over the next 90 days, patient will work with PharmD and primary care provider to address optimized medication management  Interventions: . Comprehensive medication review performed, medication list updated in electronic medical record . Reviewed that pending prednisone prescription for COPD exacerbation will result in increase in blood sugars. He verbalized understanding . Noted that prednisone, azithromycin, and doxycycline prescriptions were sent to mail order; collaborated w/ Marnee Guarneri to resend to [facility]. . Reviewed upcoming specialty provider appointments.  . Will collaborate with RN CM for continued support.   Patient Self Care Activities:  . Patient will check blood glucose daily , document, and provide at future appointments . Patient will take medications as prescribed . Patient will report any questions or concerns to provider   Please see past updates related to this goal by clicking on the "Past Updates" button in the selected goal         Plan:  - Will meet with patient in 2 weeks when he comes back to office for follow up for COPD exacerbation  Catie Darnelle Maffucci, PharmD Clinical Pharmacist [place] [phone]

## 2019-01-11 NOTE — Patient Instructions (Signed)
Visit Information  Goals Addressed            This Visit's Progress     Patient Stated   . "I don't know much about my diabetes" (pt-stated)       Current Barriers:  . Diabetes: uncontrolled; most recent A1c 8.5%; [facility] ([name]) . Current antihyperglycemic regimen: Lantus 80 units QAM and Novolin R 25-30 units TID with meals; metformin 1000 mg BID ? Hx significant GI upset and intolerance with Victoza and Trulicity ? Hx allergic rash to [place] ? Notes that he was previously on Humalog and it "made my ankles swell" ? Notes that Lantus "works better" than Toujeo for him . Reports 1 episode of hypoglycemia recently, treated w/ candy . Cardiovascular risk reduction: o Current hypertensive regimen: amlodipine 5 mg daily, carvedilol 25 mg BID, clonidine 0.1 BID, HCTZ 25 mg, losartan 100 mg daily; BP generally well controlled in clinic o Current hyperlipidemia regimen: rosuvastatin 40 mg daily; LDL at goal <70 on last check per Care Everywhere  Pharmacist Clinical Goal(s):  Marland Kitchen Over the next 90 days, patient will work with PharmD and primary care provider to address optimized medication management  Interventions: . Comprehensive medication review performed, medication list updated in electronic medical record . Reviewed that pending prednisone prescription for COPD exacerbation will result in increase in blood sugars. He verbalized understanding . Noted that prednisone, azithromycin, and doxycycline prescriptions were sent to mail order; collaborated w/ Marnee Guarneri to resend to [facility]. . Reviewed upcoming specialty provider appointments.  . Will collaborate with RN CM for continued support.   Patient Self Care Activities:  . Patient will check blood glucose daily , document, and provide at future appointments . Patient will take medications as prescribed . Patient will report any questions or concerns to  provider   Please see past updates related to this goal by clicking on the "Past Updates" button in the selected goal         The patient verbalized understanding of instructions provided today and declined a print copy of patient instruction materials.   Plan:  - Will meet with patient in 2 weeks when he comes back to office for follow up for COPD exacerbation  Catie Darnelle Maffucci, PharmD Clinical Pharmacist [place] [phone]

## 2019-01-12 LAB — COMPREHENSIVE METABOLIC PANEL
ALT: 13 IU/L (ref 0–44)
AST: 12 IU/L (ref 0–40)
Albumin/Globulin Ratio: 1.4 (ref 1.2–2.2)
Albumin: 4 g/dL (ref 3.7–4.7)
Alkaline Phosphatase: 57 IU/L (ref 39–117)
BUN/Creatinine Ratio: 12 (ref 10–24)
BUN: 14 mg/dL (ref 8–27)
Bilirubin Total: 0.6 mg/dL (ref 0.0–1.2)
CO2: 27 mmol/L (ref 20–29)
Calcium: 10 mg/dL (ref 8.6–10.2)
Chloride: 98 mmol/L (ref 96–106)
Creatinine, Ser: 1.16 mg/dL (ref 0.76–1.27)
GFR calc Af Amer: 69 mL/min/{1.73_m2} (ref >59)
GFR calc non Af Amer: 60 mL/min/{1.73_m2} (ref >59)
Globulin, Total: 2.8 g/dL (ref 1.5–4.5)
Glucose: 176 mg/dL — ABNORMAL HIGH (ref 65–99)
Potassium: 4.4 mmol/L (ref 3.5–5.2)
Sodium: 139 mmol/L (ref 134–144)
Total Protein: 6.8 g/dL (ref 6.0–8.5)

## 2019-01-12 LAB — CBC WITH DIFFERENTIAL/PLATELET
Basophils Absolute: 0.1 10*3/uL (ref 0.0–0.2)
Basos: 1 %
EOS (ABSOLUTE): 0.3 10*3/uL (ref 0.0–0.4)
Eos: 4 %
Hematocrit: 44.3 % (ref 37.5–51.0)
Hemoglobin: 14.8 g/dL (ref 13.0–17.7)
Immature Grans (Abs): 0 10*3/uL (ref 0.0–0.1)
Immature Granulocytes: 0 %
Lymphocytes Absolute: 1.6 10*3/uL (ref 0.7–3.1)
Lymphs: 23 %
MCH: 30.5 pg (ref 26.6–33.0)
MCHC: 33.4 g/dL (ref 31.5–35.7)
MCV: 91 fL (ref 79–97)
Monocytes Absolute: 0.8 10*3/uL (ref 0.1–0.9)
Monocytes: 11 %
Neutrophils Absolute: 4.5 10*3/uL (ref 1.4–7.0)
Neutrophils: 61 %
Platelets: 241 10*3/uL (ref 150–450)
RBC: 4.85 x10E6/uL (ref 4.14–5.80)
RDW: 13.2 % (ref 11.6–15.4)
WBC: 7.3 10*3/uL (ref 3.4–10.8)

## 2019-01-12 NOTE — Progress Notes (Signed)
Normal test results noted.  Please call patient and make them aware of normal results and will continue to monitor at regular visits.  Have a great day.  Look forward to seeing you at your next visit.

## 2019-01-25 ENCOUNTER — Other Ambulatory Visit: Payer: Self-pay

## 2019-01-25 ENCOUNTER — Ambulatory Visit: Payer: Medicare Other

## 2019-01-25 ENCOUNTER — Encounter: Payer: Self-pay | Admitting: Nurse Practitioner

## 2019-01-25 ENCOUNTER — Ambulatory Visit (INDEPENDENT_AMBULATORY_CARE_PROVIDER_SITE_OTHER): Payer: Medicare Other | Admitting: Nurse Practitioner

## 2019-01-25 ENCOUNTER — Ambulatory Visit: Payer: Medicare Other | Admitting: Pharmacist

## 2019-01-25 VITALS — BP 120/78 | HR 59 | Temp 98.4°F | Ht 71.0 in | Wt 304.0 lb

## 2019-01-25 DIAGNOSIS — J441 Chronic obstructive pulmonary disease with (acute) exacerbation: Secondary | ICD-10-CM

## 2019-01-25 DIAGNOSIS — E119 Type 2 diabetes mellitus without complications: Secondary | ICD-10-CM

## 2019-01-25 DIAGNOSIS — G4733 Obstructive sleep apnea (adult) (pediatric): Secondary | ICD-10-CM

## 2019-01-25 NOTE — Patient Instructions (Signed)
Chronic Obstructive Pulmonary Disease °Chronic obstructive pulmonary disease (COPD) is a long-term (chronic) lung problem. When you have COPD, it is hard for air to get in and out of your lungs. Usually the condition gets worse over time, and your lungs will never return to normal. There are things you can do to keep yourself as healthy as possible. °· Your doctor may treat your condition with: °? Medicines. °? Oxygen. °? Lung surgery. °· Your doctor may also recommend: °? Rehabilitation. This includes steps to make your body work better. It may involve a team of specialists. °? Quitting smoking, if you smoke. °? Exercise and changes to your diet. °? Comfort measures (palliative care). °Follow these instructions at home: °Medicines °· Take over-the-counter and prescription medicines only as told by your doctor. °· Talk to your doctor before taking any cough or allergy medicines. You may need to avoid medicines that cause your lungs to be dry. °Lifestyle °· If you smoke, stop. Smoking makes the problem worse. If you need help quitting, ask your doctor. °· Avoid being around things that make your breathing worse. This may include smoke, chemicals, and fumes. °· Stay active, but remember to rest as well. °· Learn and use tips on how to relax. °· Make sure you get enough sleep. Most adults need at least 7 hours of sleep every night. °· Eat healthy foods. Eat smaller meals more often. Rest before meals. °Controlled breathing °Learn and use tips on how to control your breathing as told by your doctor. Try: °· Breathing in (inhaling) through your nose for 1 second. Then, pucker your lips and breath out (exhale) through your lips for 2 seconds. °· Putting one hand on your belly (abdomen). Breathe in slowly through your nose for 1 second. Your hand on your belly should move out. Pucker your lips and breathe out slowly through your lips. Your hand on your belly should move in as you breathe out. ° °Controlled coughing °Learn  and use controlled coughing to clear mucus from your lungs. Follow these steps: °1. Lean your head a little forward. °2. Breathe in deeply. °3. Try to hold your breath for 3 seconds. °4. Keep your mouth slightly open while coughing 2 times. °5. Spit any mucus out into a tissue. °6. Rest and do the steps again 1 or 2 times as needed. °General instructions °· Make sure you get all the shots (vaccines) that your doctor recommends. Ask your doctor about a flu shot and a pneumonia shot. °· Use oxygen therapy and pulmonary rehabilitation if told by your doctor. If you need home oxygen therapy, ask your doctor if you should buy a tool to measure your oxygen level (oximeter). °· Make a COPD action plan with your doctor. This helps you to know what to do if you feel worse than usual. °· Manage any other conditions you have as told by your doctor. °· Avoid going outside when it is very hot, cold, or humid. °· Avoid people who have a sickness you can catch (contagious). °· Keep all follow-up visits as told by your doctor. This is important. °Contact a doctor if: °· You cough up more mucus than usual. °· There is a change in the color or thickness of the mucus. °· It is harder to breathe than usual. °· Your breathing is faster than usual. °· You have trouble sleeping. °· You need to use your medicines more often than usual. °· You have trouble doing your normal activities such as getting dressed   or walking around the house. °Get help right away if: °· You have shortness of breath while resting. °· You have shortness of breath that stops you from: °? Being able to talk. °? Doing normal activities. °· Your chest hurts for longer than 5 minutes. °· Your skin color is more blue than usual. °· Your pulse oximeter shows that you have low oxygen for longer than 5 minutes. °· You have a fever. °· You feel too tired to breathe normally. °Summary °· Chronic obstructive pulmonary disease (COPD) is a long-term lung problem. °· The way your  lungs work will never return to normal. Usually the condition gets worse over time. There are things you can do to keep yourself as healthy as possible. °· Take over-the-counter and prescription medicines only as told by your doctor. °· If you smoke, stop. Smoking makes the problem worse. °This information is not intended to replace advice given to you by your health care provider. Make sure you discuss any questions you have with your health care provider. °Document Released: 08/26/2007 Document Revised: 02/19/2017 Document Reviewed: 04/13/2016 °Elsevier Patient Education © 2020 [employer] ° °

## 2019-01-25 NOTE — Progress Notes (Signed)
BP 120/78   Pulse (!) 59   Temp 98.4 F (36.9 C) (Oral)   Ht 5\' 11"  (1.803 m)   Wt (!) 304 lb (137.9 kg)   SpO2 95%   BMI 42.40 kg/m    Subjective:    Patient ID: Justin Griffith, male    DOB: November 01, 1940, 78 y.o.   MRN: [id number]  HPI: Justin Griffith is a 78 y.o. male  Chief Complaint  Patient presents with  . COPD    f/u   COPD He is followed by [name].  Last saw him in June and has follow-up in two weeks. Is using Duoneb at home Q6H.  Had been on Breo prior to this, but this changed to Duoneb in June by [name].  Recently treated for exacerbation and reports he is not coughing as much as he did and is not spitting up anything, is getting "better day by day".  Quit smoking in 1991.  Does have OSA and equipment is obtained from [name], reports using CPAP 100% at time at night, but not always when he naps.  States his cough is worse at times when he sits back in recliner.  Does have a cat and dog at home, but denies allergies.  States his cough is improved when he is out and about.  Has been focused on weight loss, which he reports has improved his breathing.   COPD status: stable Satisfied with current treatment?: yes Oxygen use: no Dyspnea frequency:  Cough frequency: intermittent, but improved Rescue inhaler frequency: minimally using Limitation of activity: no Productive cough: improved, no further phlegm per his report Last Spirometry: with [name] Pneumovax: Up to Date Influenza: Up to Date  Relevant past medical, surgical, family and social history reviewed and updated as indicated. Interim medical history since our last visit reviewed. Allergies and medications reviewed and updated.  Review of Systems  Constitutional: Negative for activity change, diaphoresis, fatigue and fever.  Respiratory: Positive for cough. Negative for chest tightness, shortness of breath and wheezing.   Cardiovascular: Negative for chest pain, palpitations and leg swelling.   Gastrointestinal: Negative for abdominal distention, abdominal pain, constipation, diarrhea, nausea and vomiting.  Endocrine: Negative for cold intolerance, heat intolerance, polydipsia, polyphagia and polyuria.  Musculoskeletal: Negative.   Skin: Negative.   Neurological: Negative for dizziness, syncope, weakness, light-headedness, numbness and headaches.  Psychiatric/Behavioral: Negative.     Per HPI unless specifically indicated above     Objective:    BP 120/78   Pulse (!) 59   Temp 98.4 F (36.9 C) (Oral)   Ht 5\' 11"  (1.803 m)   Wt (!) 304 lb (137.9 kg)   SpO2 95%   BMI 42.40 kg/m   Wt Readings from Last 3 Encounters:  01/25/19 (!) 304 lb (137.9 kg)  01/11/19 (!) 308 lb (139.7 kg)  12/14/18 (!) 318 lb (144.2 kg)    Physical Exam Vitals signs and nursing note reviewed.  Constitutional:      General: He is awake. He is not in acute distress.    Appearance: He is well-developed. He is morbidly obese. He is not ill-appearing.  HENT:     Head: Normocephalic and atraumatic.     Right Ear: Hearing, tympanic membrane, ear canal and external ear normal. No drainage.     Left Ear: Hearing, tympanic membrane, ear canal and external ear normal. No drainage.     Mouth/Throat:     Pharynx: Uvula midline. No pharyngeal swelling, oropharyngeal exudate or  posterior oropharyngeal erythema.  Eyes:     General: Lids are normal.        Right eye: No discharge.        Left eye: No discharge.     Conjunctiva/sclera: Conjunctivae normal.     Pupils: Pupils are equal, round, and reactive to light.  Neck:     Musculoskeletal: Normal range of motion and neck supple.     Thyroid: No thyromegaly.     Vascular: No carotid bruit or JVD.  Cardiovascular:     Rate and Rhythm: Normal rate and regular rhythm.     Heart sounds: Normal heart sounds, S1 normal and S2 normal. No murmur. No gallop.   Pulmonary:     Effort: Pulmonary effort is normal. No accessory muscle usage or respiratory  distress.     Breath sounds: Decreased breath sounds present. No wheezing.     Comments: Decreased breath sounds and no wheezing noted today.  No SOB with talking or when walked from office door to exam room. Abdominal:     General: Bowel sounds are normal.     Palpations: Abdomen is soft. There is no hepatomegaly or splenomegaly.     Tenderness: There is no abdominal tenderness.  Musculoskeletal: Normal range of motion.     Right lower leg: 1+ Pitting Edema present.     Left lower leg: 1+ Pitting Edema present.  Lymphadenopathy:     Cervical: No cervical adenopathy.  Skin:    General: Skin is warm and dry.     Capillary Refill: Capillary refill takes less than 2 seconds.  Neurological:     Mental Status: He is alert and oriented to person, place, and time.     Deep Tendon Reflexes: Reflexes are normal and symmetric.  Psychiatric:        Mood and Affect: Mood normal.        Behavior: Behavior normal. Behavior is cooperative.        Thought Content: Thought content normal.        Judgment: Judgment normal.     Results for orders placed or performed in visit on 01/11/19  CBC with Differential/Platelet  Result Value Ref Range   WBC 7.3 3.4 - 10.8 x10E3/uL   RBC 4.85 4.14 - 5.80 x10E6/uL   Hemoglobin 14.8 13.0 - 17.7 g/dL   Hematocrit 44.3 37.5 - 51.0 %   MCV 91 79 - 97 fL   MCH 30.5 26.6 - 33.0 pg   MCHC 33.4 31.5 - 35.7 g/dL   RDW 13.2 11.6 - 15.4 %   Platelets 241 150 - 450 x10E3/uL   Neutrophils 61 Not Estab. %   Lymphs 23 Not Estab. %   Monocytes 11 Not Estab. %   Eos 4 Not Estab. %   Basos 1 Not Estab. %   Neutrophils Absolute 4.5 1.4 - 7.0 x10E3/uL   Lymphocytes Absolute 1.6 0.7 - 3.1 x10E3/uL   Monocytes Absolute 0.8 0.1 - 0.9 x10E3/uL   EOS (ABSOLUTE) 0.3 0.0 - 0.4 x10E3/uL   Basophils Absolute 0.1 0.0 - 0.2 x10E3/uL   Immature Granulocytes 0 Not Estab. %   Immature Grans (Abs) 0.0 0.0 - 0.1 x10E3/uL  Comprehensive metabolic panel  Result Value Ref Range    Glucose 176 (H) 65 - 99 mg/dL   BUN 14 8 - 27 mg/dL   Creatinine, Ser 1.16 0.76 - 1.27 mg/dL   GFR calc non Af Amer 60 >59 mL/min/1.73   GFR calc Af Amer 69 >59 mL/min/1.73  BUN/Creatinine Ratio 12 10 - 24   Sodium 139 134 - 144 mmol/L   Potassium 4.4 3.5 - 5.2 mmol/L   Chloride 98 96 - 106 mmol/L   CO2 27 20 - 29 mmol/L   Calcium 10.0 8.6 - 10.2 mg/dL   Total Protein 6.8 6.0 - 8.5 g/dL   Albumin 4.0 3.7 - 4.7 g/dL   Globulin, Total 2.8 1.5 - 4.5 g/dL   Albumin/Globulin Ratio 1.4 1.2 - 2.2   Bilirubin Total 0.6 0.0 - 1.2 mg/dL   Alkaline Phosphatase 57 39 - 117 IU/L   AST 12 0 - 40 IU/L   ALT 13 0 - 44 IU/L      Assessment & Plan:   Problem List Items Addressed This Visit      Respiratory   Sleep apnea    Reports 100% use of this at night.  Recommend use of this when napping during day too.      COPD with acute exacerbation ([place]) - Primary    Improving, no PNA noted on recent CXR and Covid negative.  He reports improvement in symptoms, but does state continued occasional coughing spells.  Recommend continued use of Duoneb as ordered and to use Albuterol inhaler as needed, especially for coughing spells.  Recommend he carry Albuterol inhaler with him at all times.  Attend schedule follow-up with [name] upcoming and discuss current inhaler regimen, may benefit from return to inhaled steroid.  Discussed progressive nature of COPD with patient.          Follow up plan: Return in about 2 months (around 03/27/2019) for T2DM, HTN/HLD, OSA, COPD.

## 2019-01-25 NOTE — Assessment & Plan Note (Signed)
Improving, no PNA noted on recent CXR and Covid negative.  He reports improvement in symptoms, but does state continued occasional coughing spells.  Recommend continued use of Duoneb as ordered and to use Albuterol inhaler as needed, especially for coughing spells.  Recommend he carry Albuterol inhaler with him at all times.  Attend schedule follow-up with [name] upcoming and discuss current inhaler regimen, may benefit from return to inhaled steroid.  Discussed progressive nature of COPD with patient.

## 2019-01-25 NOTE — Patient Instructions (Addendum)
It was great to see you today!  Talk to [name] about this continued cough. We wonder if we may need to change up the inhaler therapy that you are receiving. Right now, you said you are taking Duonebs in the nebulizer (albuterol + ipratropium) and the albuterol inhaler for rescue medication. We wonder if you still need some sort of inhaled steroid, like how you were getting in the [facility] or Symbicort (the previous inhalers you were taking before you switched to the nebulizer).   For your diabetes, [name] will need to see sugars 1) first thing in the morning, before you've eaten anything, and 2) 2 hours after a meal. Talk to [name] about if Novolin R (regular insulin) is the best mealtime insulin for you, or if something like Novolog or Humalog would be better, given some of the issues with low blood sugars you have had. We can work with your [place] doctor about getting your insulins through them.   I'll plan to call you after you see [name], [name], and [name], so sometime in early December. It looks like [name] (heart doctor) will want to see you sometime in March.   Please call me if you have any questions or need anything!  Visit Information  Goals Addressed            This Visit's Progress     Patient Stated   . "I don't know much about my diabetes" (pt-stated)       Current Barriers:  . Diabetes: uncontrolled; most recent A1c 8.5%; [facility]inology ([name]) . Current antihyperglycemic regimen: Lantus 80 units QAM and Novolin R 25-30 units TID with meals; metformin 1000 mg BID o Notes he only takes Novolin R before meals if BG >150, states this recommendation came from [name]. Also notes that he uses for correction; notes a few nights ago where BG was 270 after cobbler, so her took Novolin R.  ? Hx significant GI upset and intolerance with Victoza and Trulicity ? Hx allergic rash to [place] ? Notes that he was previously on Humalog and it "made my ankles swell" ? Notes that Lantus "works better" than Toujeo for him . Current meal patterns:  o Patient reports that he has cut back on what he is eating, now only eating ~1 meal per day, to lose weight. Reports weight loss over the past few weeks, with improvement in breathing and energy  . Cardiovascular risk reduction: o Current hypertensive regimen: amlodipine 5 mg daily, carvedilol 25 mg BID, clonidine 0.1 BID, HCTZ 25 mg, losartan 100 mg daily; BP generally well controlled in clinic o Current hyperlipidemia regimen: rosuvastatin 40 mg daily; LDL at goal <70 on last check per Care Everywhere  Pharmacist Clinical Goal(s):  Marland Kitchen Over the next 90 days, patient will work with PharmD and primary care provider to address optimized medication management  Interventions: . Comprehensive medication review performed, medication list updated in electronic medical record . Reviewed pharmacokinetics of Novolin R, and danger of taking after a meal/snack as correctional insulin. Asked to only taking Novolin R before meals.  . Encouraged to check 2 hour post prandial readings over the next 2 weeks before Dr. Sherren Mocha appointment to provide to him. Also provided written note to [name] to consider Humalog/Novolog, given patient's risk of hypoglycemia d/t inappropriate insulin use. Patient can obtain affordably from the [name].  Marland Kitchen Provided w/ BG log and explained  to document fasting and 2 hour post prandial after the meal he ate that day.   Patient Self Care Activities:  . Patient will check blood glucose daily , document, and provide at future appointments . Patient will take medications as prescribed . Patient will report any questions or concerns to provider   Please see past updates related to this goal by clicking on the "Past Updates" button in the selected goal      . "My breathing isn't good" (pt-stated)       Current Barriers:   . Hx tobacco abuse, quit smoking ~30 years ago, though diagnosis of COPD/emphysema. Follows with [name] at [facility] pulmonary, appointment in 2 weeks for follow up . Today, patient reports taking Duonebs Q6H and albuterol HFA, though is worried about overlapping nebulized albuterol and albuterol inhaler  . Patient reports improvement in breathing s/p azithromycin and prednisone therapy, however, notes continued occasional troublesome cough  Pharmacist Clinical Goal(s):  Marland Kitchen Over the next 90 days, patient will work with PharmD and pulmonary team to address needs related to optimized management of chronic lung disease  Interventions: . Collaborated with Jolene [name] to counsel on use of albuterol HFA rescue inhaler.  . Discussed consideration for ICS therapy d/t recent exacerbation. Encouraged patient to discuss optimal inhaler therapy regimen w/ [name] at upcoming appointment in ~2 weeks  Patient Self Care Activities:  . Self administers medications as prescribed  Please see past updates related to this goal by clicking on the "Past Updates" button in the selected goal         The patient verbalized understanding of instructions provided today and declined a print copy of patient instruction materials.    Plan:  - Will outreach patient in ~3-4 weeks after appointments w/ [name], [name], and Dr. Presley Raddle, PharmD Clinical Pharmacist [place] [phone]

## 2019-01-25 NOTE — Assessment & Plan Note (Signed)
Reports 100% use of this at night.  Recommend use of this when napping during day too.

## 2019-01-25 NOTE — Chronic Care Management (AMB) (Signed)
Chronic Care Management   Follow Up Note   01/25/2019 Name: Justin Griffith MRN: [id number] DOB: Apr 28, 1940  Referred by: Guadalupe Maple, MD Reason for referral : Chronic Care Management (Medication Management)   Justin Griffith is a 78 y.o. year old male who is a primary care patient of Crissman, Jeannette How, MD. The CCM team was consulted for assistance with chronic disease management and care coordination needs.    Met with patient face to face prior to appointment with Marnee Guarneri, NP today.   Review of patient status, including review of consultants reports, relevant laboratory and other test results, and collaboration with appropriate care team members and the patient's provider was performed as part of comprehensive patient evaluation and provision of chronic care management services.    SDOH (Social Determinants of Health) screening performed today: Physical Activity. See Care Plan for related entries.   Advanced Directives Status: N See Care Plan and Vynca application for related entries.  Outpatient Encounter Medications as of 01/25/2019  Medication Sig Note  . insulin glargine (LANTUS) 100 UNIT/ML injection Inject 75 Units into the skin daily.  01/11/2019: 80  . insulin regular (NOVOLIN R) 100 units/mL injection Inject 30 Units into the skin 3 (three) times daily before meals.  01/25/2019: Taking PRN when sugar >150  . acyclovir (ZOVIRAX) 400 MG tablet Take 400 mg by mouth 2 (two) times daily.   Marland Kitchen albuterol (PROVENTIL HFA) 108 (90 Base) MCG/ACT inhaler Inhale 1-2 puffs into the lungs every 4 (four) hours as needed.  09/02/2018: Using 2 puffs Q4H  . amLODipine (NORVASC) 5 MG tablet Take 1 tablet (5 mg total) by mouth daily.   Marland Kitchen aspirin EC 81 MG tablet Take 81 mg by mouth daily.    . carvedilol (COREG) 25 MG tablet Take 1 tablet (25 mg total) by mouth 2 (two) times daily with a meal.   . cloNIDine (CATAPRES) 0.1 MG tablet Take 1 tablet (0.1 mg total) by mouth 2 (two) times daily.  (Patient taking differently: Take 0.2 mg by mouth 2 (two) times daily. )   . clotrimazole-betamethasone (LOTRISONE) lotion Apply topically 2 (two) times daily. 09/02/2018: Using 1/month  . furosemide (LASIX) 20 MG tablet Take 2 tablets (40 mg total) by mouth daily. 11/29/2018: Taking 20 mg daily   . hydrochlorothiazide (HYDRODIURIL) 25 MG tablet Take 25 mg by mouth daily.   Marland Kitchen ipratropium-albuterol (DUONEB) 0.5-2.5 (3) MG/3ML SOLN Take 3 mLs by nebulization every 6 (six) hours.   Marland Kitchen losartan (COZAAR) 100 MG tablet Take 100 mg by mouth daily.   . metFORMIN (GLUCOPHAGE) 500 MG tablet Take 1,000 mg by mouth 2 (two) times daily with a meal. Take 2 y mouth 2 times daily   . naproxen (NAPROSYN) 500 MG tablet Take 500 mg by mouth 2 (two) times daily with a meal.   . rosuvastatin (CRESTOR) 40 MG tablet Take 1 tablet (40 mg total) by mouth at bedtime. (Patient taking differently: Take 40 mg by mouth at bedtime. 20 mg once a day)   . tamsulosin (FLOMAX) 0.4 MG CAPS capsule Take 0.4 mg by mouth daily.    No facility-administered encounter medications on file as of 01/25/2019.      Goals Addressed            This Visit's Progress     Patient Stated   . "I don't know much about my diabetes" (pt-stated)       Current Barriers:  . Diabetes: uncontrolled; most recent A1c 8.5%;  [facility] ([name]) . Current antihyperglycemic regimen: Lantus 80 units QAM and Novolin R 25-30 units TID with meals; metformin 1000 mg BID o Notes he only takes Novolin R before meals if BG >150, states this recommendation came from [name]. Also notes that he uses for correction; notes a few nights ago where BG was 270 after cobbler, so her took Novolin R.  ? Hx significant GI upset and intolerance with Victoza and Trulicity ? Hx allergic rash to [place] ? Notes that he was previously on Humalog and it "made my ankles swell" ? Notes that Lantus "works better"  than Toujeo for him . Current meal patterns:  o Patient reports that he has cut back on what he is eating, now only eating ~1 meal per day, to lose weight. Reports weight loss over the past few weeks, with improvement in breathing and energy  . Cardiovascular risk reduction: o Current hypertensive regimen: amlodipine 5 mg daily, carvedilol 25 mg BID, clonidine 0.1 BID, HCTZ 25 mg, losartan 100 mg daily; BP generally well controlled in clinic o Current hyperlipidemia regimen: rosuvastatin 40 mg daily; LDL at goal <70 on last check per Care Everywhere  Pharmacist Clinical Goal(s):  Marland Kitchen Over the next 90 days, patient will work with PharmD and primary care provider to address optimized medication management  Interventions: . Comprehensive medication review performed, medication list updated in electronic medical record . Reviewed pharmacokinetics of Novolin R, and danger of taking after a meal/snack as correctional insulin. Asked to only taking Novolin R before meals.  . Encouraged to check 2 hour post prandial readings over the next 2 weeks before Dr. Sherren Mocha appointment to provide to him. Also provided written note to [name] to consider Humalog/Novolog, given patient's risk of hypoglycemia d/t inappropriate insulin use. Patient can obtain affordably from the [name].  Marland Kitchen Provided w/ BG log and explained to document fasting and 2 hour post prandial after the meal he ate that day.   Patient Self Care Activities:  . Patient will check blood glucose daily , document, and provide at future appointments . Patient will take medications as prescribed . Patient will report any questions or concerns to provider   Please see past updates related to this goal by clicking on the "Past Updates" button in the selected goal      . "My breathing isn't good" (pt-stated)       Current Barriers:  . Hx tobacco abuse, quit smoking ~30 years ago, though diagnosis of COPD/emphysema. Follows with [name] at  [facility] pulmonary, appointment in 2 weeks for follow up . Today, patient reports taking Duonebs Q6H and albuterol HFA, though is worried about overlapping nebulized albuterol and albuterol inhaler  . Patient reports improvement in breathing s/p azithromycin and prednisone therapy, however, notes continued occasional troublesome cough  Pharmacist Clinical Goal(s):  Marland Kitchen Over the next 90 days, patient will work with PharmD and pulmonary team to address needs related to optimized management of chronic lung disease  Interventions: . Collaborated with Jolene [name] to counsel on use of albuterol HFA rescue inhaler.  . Discussed consideration for ICS therapy d/t recent exacerbation. Encouraged patient to discuss optimal inhaler therapy regimen w/ [name] at upcoming appointment in ~2 weeks  Patient Self Care Activities:  . Self administers medications as prescribed  Please see past updates related to this goal by clicking on the "Past Updates" button in the selected goal  Plan:  - Will outreach patient in ~3-4 weeks after appointments w/ [name], [name], and Dr. Presley Raddle, PharmD Clinical Pharmacist [place] [phone]

## 2019-02-07 DIAGNOSIS — J449 Chronic obstructive pulmonary disease, unspecified: Secondary | ICD-10-CM | POA: Diagnosis not present

## 2019-02-07 DIAGNOSIS — G4733 Obstructive sleep apnea (adult) (pediatric): Secondary | ICD-10-CM | POA: Diagnosis not present

## 2019-02-07 DIAGNOSIS — R0982 Postnasal drip: Secondary | ICD-10-CM | POA: Diagnosis not present

## 2019-02-07 DIAGNOSIS — R05 Cough: Secondary | ICD-10-CM | POA: Diagnosis not present

## 2019-02-07 DIAGNOSIS — R042 Hemoptysis: Secondary | ICD-10-CM | POA: Diagnosis not present

## 2019-02-09 DIAGNOSIS — B351 Tinea unguium: Secondary | ICD-10-CM | POA: Diagnosis not present

## 2019-02-09 DIAGNOSIS — E1140 Type 2 diabetes mellitus with diabetic neuropathy, unspecified: Secondary | ICD-10-CM | POA: Diagnosis not present

## 2019-02-09 DIAGNOSIS — E1142 Type 2 diabetes mellitus with diabetic polyneuropathy: Secondary | ICD-10-CM | POA: Diagnosis not present

## 2019-02-09 DIAGNOSIS — Z794 Long term (current) use of insulin: Secondary | ICD-10-CM | POA: Diagnosis not present

## 2019-02-09 DIAGNOSIS — E1159 Type 2 diabetes mellitus with other circulatory complications: Secondary | ICD-10-CM | POA: Diagnosis not present

## 2019-02-09 DIAGNOSIS — E785 Hyperlipidemia, unspecified: Secondary | ICD-10-CM | POA: Diagnosis not present

## 2019-02-09 DIAGNOSIS — I10 Essential (primary) hypertension: Secondary | ICD-10-CM | POA: Diagnosis not present

## 2019-02-09 DIAGNOSIS — E1169 Type 2 diabetes mellitus with other specified complication: Secondary | ICD-10-CM | POA: Diagnosis not present

## 2019-02-10 DIAGNOSIS — G250 Essential tremor: Secondary | ICD-10-CM | POA: Diagnosis not present

## 2019-02-21 ENCOUNTER — Ambulatory Visit (INDEPENDENT_AMBULATORY_CARE_PROVIDER_SITE_OTHER): Payer: Medicare Other | Admitting: Pharmacist

## 2019-02-21 DIAGNOSIS — E119 Type 2 diabetes mellitus without complications: Secondary | ICD-10-CM | POA: Diagnosis not present

## 2019-02-21 DIAGNOSIS — J441 Chronic obstructive pulmonary disease with (acute) exacerbation: Secondary | ICD-10-CM | POA: Diagnosis not present

## 2019-02-21 NOTE — Patient Instructions (Signed)
Visit Information  Goals Addressed            This Visit's Progress     Patient Stated   . "I don't know much about my diabetes" (pt-stated)       Current Barriers:  . Diabetes: uncontrolled; most recent A1c 8.5%; [facility] endocrinology ([name]) o Reports he saw [name] last week and A1c was in the ~8%. Note is not available for me to review . Current antihyperglycemic regimen: Lantus 80 units QAM and Novolin R 25-30 units TID with meals; metformin 1000 mg BID o Again reports today that he generally forgets to take Novolin before he starts to eat, so he takes after a meal. However, then tells me that he only takes Novolin if BG is >150, and cannot specify if this is before or after a meal ? Hx significant GI upset and intolerance with Victoza and Trulicity ? Hx allergic rash to [place] ? Notes that he was previously on Humalog and it "made my ankles swell" ? Notes that Lantus "works better" than Toujeo for him . Current meal patterns:  o Continues to try to eat 1 meal per day to lose weight  . Current glucose readings:  o 130-140s throughout the day, reports a reading in the 200s that he corrected with insulin  o Reports a reading in the 51, ate a piece of cake and brought it back to 108 o Also reports another episode where he was driving and started to feel poorly. Did not check sugar, and instead stopped at [employer] to buy orange juice. . Cardiovascular risk reduction: o Current hypertensive regimen: amlodipine 5 mg daily, carvedilol 25 mg BID, clonidine 0.1 BID, HCTZ 25 mg, losartan 100 mg daily; BP generally well controlled in clinic o Current hyperlipidemia regimen: rosuvastatin 40 mg daily; LDL at goal <70 on last check per Care Everywhere  Pharmacist Clinical Goal(s):  Marland Kitchen Over the next 90 days, patient will work with PharmD and primary care provider to address optimized medication  management  Interventions: . Discussed necessity of taking Novolin R before a meal d/t pharmacokinetics and to prevent hypoglycemia. Discussed setting an alarm, putting a sign on his kitchen table, and asking his family members to remind him to take insulin prior to his meal. Patient had to end the call due to another call coming through, discussed following up in a few weeks for continued education.  Marland Kitchen Unfortunately, prior intolerance to GLP1 and SGLT2 agents, as well as Toujeo. Could consider discussing switch to [place] w/ patient and [name], however, patient historically resistant to insulin changes. Also consider discussing switch to Novolog instead of regular insulin for shorter duration of action to prevent post-meal hypoglycemia w/ taking insulin after a meal  Patient Self Care Activities:  . Patient will check blood glucose daily , document, and provide at future appointments . Patient will take medications as prescribed . Patient will report any questions or concerns to provider   Please see past updates related to this goal by clicking on the "Past Updates" button in the selected goal      . "My breathing isn't good" (pt-stated)       Current Barriers:  . Hx tobacco abuse, quit smoking ~30 years ago, though diagnosis of COPD/emphysema. Follows with [name] at [facility] pulmonary . Patient notes that he was prescribed prednisone daily + fexofenidine + codeine cough syrup when he saw [name] ~2-3 weeks ago (  note not available for review). Notes continued cough, and he has run out of codeine cough syrup. Has f/u with [name] tomorrow  Pharmacist Clinical Goal(s):  Marland Kitchen Over the next 90 days, patient will work with PharmD and pulmonary team to address needs related to optimized management of chronic lung disease  Interventions: . Discussed impact of prednisone therapy on blood sugars.  . Encouraged continued use of Duonebs QID w/ albuterol rescue inhaler  PRN. Marland Kitchen Reviewed that cough can often persist for weeks after an infection. Encouraged to discuss continued concerns w/ [name] tomorrow  Patient Self Care Activities:  . Self administers medications as prescribed  Please see past updates related to this goal by clicking on the "Past Updates" button in the selected goal         The patient verbalized understanding of instructions provided today and declined a print copy of patient instruction materials.   Plan: - Scheduled f/u call in 2 weeks for further diabetic education. Will collaborate w/ RN CM for continued health education   Catie Darnelle Maffucci, PharmD, [place] [phone]

## 2019-02-21 NOTE — Chronic Care Management (AMB) (Signed)
Chronic Care Management   Follow Up Note   02/21/2019 Name: Justin Griffith MRN: [id number] DOB: 04/05/1940  Referred by: Guadalupe Maple, MD Reason for referral : Chronic Care Management (Medication Management)   Justin Griffith is a 78 y.o. year old male who is a primary care patient of Crissman, Jeannette How, MD. The CCM team was consulted for assistance with chronic disease management and care coordination needs.    Contacted patient for medication management review today.   Review of patient status, including review of consultants reports, relevant laboratory and other test results, and collaboration with appropriate care team members and the patient's provider was performed as part of comprehensive patient evaluation and provision of chronic care management services.    SDOH (Social Determinants of Health) screening performed today: None. See Care Plan for related entries.   Outpatient Encounter Medications as of 02/21/2019  Medication Sig Note   fexofenadine (ALLEGRA) 180 MG tablet Take 180 mg by mouth daily.    ipratropium-albuterol (DUONEB) 0.5-2.5 (3) MG/3ML SOLN Take 3 mLs by nebulization every 6 (six) hours. 02/21/2019: Taking QID    metFORMIN (GLUCOPHAGE) 500 MG tablet Take 1,000 mg by mouth 2 (two) times daily with a meal. Take 2 y mouth 2 times daily    predniSONE (DELTASONE) 10 MG tablet Take 10 mg by mouth daily with breakfast.    acyclovir (ZOVIRAX) 400 MG tablet Take 400 mg by mouth 2 (two) times daily.    albuterol (PROVENTIL HFA) 108 (90 Base) MCG/ACT inhaler Inhale 1-2 puffs into the lungs every 4 (four) hours as needed.  09/02/2018: Using 2 puffs Q4H   amLODipine (NORVASC) 5 MG tablet Take 1 tablet (5 mg total) by mouth daily.    aspirin EC 81 MG tablet Take 81 mg by mouth daily.     carvedilol (COREG) 25 MG tablet Take 1 tablet (25 mg total) by mouth 2 (two) times daily with a meal.    cloNIDine (CATAPRES) 0.1 MG tablet Take 1 tablet (0.1 mg total) by mouth 2  (two) times daily. (Patient taking differently: Take 0.2 mg by mouth 2 (two) times daily. )    clotrimazole-betamethasone (LOTRISONE) lotion Apply topically 2 (two) times daily. 09/02/2018: Using 1/month   furosemide (LASIX) 20 MG tablet Take 2 tablets (40 mg total) by mouth daily. 11/29/2018: Taking 20 mg daily    hydrochlorothiazide (HYDRODIURIL) 25 MG tablet Take 25 mg by mouth daily.    insulin glargine (LANTUS) 100 UNIT/ML injection Inject 75 Units into the skin daily.  01/11/2019: 80   insulin regular (NOVOLIN R) 100 units/mL injection Inject 30 Units into the skin 3 (three) times daily before meals.  01/25/2019: Taking PRN when sugar >150   losartan (COZAAR) 100 MG tablet Take 100 mg by mouth daily.    naproxen (NAPROSYN) 500 MG tablet Take 500 mg by mouth 2 (two) times daily with a meal.    rosuvastatin (CRESTOR) 40 MG tablet Take 1 tablet (40 mg total) by mouth at bedtime. (Patient taking differently: Take 40 mg by mouth at bedtime. 20 mg once a day)    tamsulosin (FLOMAX) 0.4 MG CAPS capsule Take 0.4 mg by mouth daily.    No facility-administered encounter medications on file as of 02/21/2019.      Goals Addressed            This Visit's Progress     Patient Stated    "I don't know much about my diabetes" (pt-stated)  Current Barriers:   Diabetes: uncontrolled; most recent A1c 8.5%; [facility] ([name]) o Reports he saw [name] last week and A1c was in the ~8%. Note is not available for me to review  Current antihyperglycemic regimen: Lantus 80 units QAM and Novolin R 25-30 units TID with meals; metformin 1000 mg BID o Again reports today that he generally forgets to take Novolin before he starts to eat, so he takes after a meal. However, then tells me that he only takes Novolin if BG is >150, and cannot specify if this is before or after a meal ? Hx significant GI upset and intolerance with Victoza and  Trulicity ? Hx allergic rash to [place] ? Notes that he was previously on Humalog and it "made my ankles swell" ? Notes that Lantus "works better" than Toujeo for him  Current meal patterns:  o Continues to try to eat 1 meal per day to lose weight   Current glucose readings:  o 130-140s throughout the day, reports a reading in the 200s that he corrected with insulin  o Reports a reading in the 51, ate a piece of cake and brought it back to 108 o Also reports another episode where he was driving and started to feel poorly. Did not check sugar, and instead stopped at [employer] to buy orange juice.  Cardiovascular risk reduction: o Current hypertensive regimen: amlodipine 5 mg daily, carvedilol 25 mg BID, clonidine 0.1 BID, HCTZ 25 mg, losartan 100 mg daily; BP generally well controlled in clinic o Current hyperlipidemia regimen: rosuvastatin 40 mg daily; LDL at goal <70 on last check per Care Everywhere  Pharmacist Clinical Goal(s):   Over the next 90 days, patient will work with PharmD and primary care provider to address optimized medication management  Interventions:  Discussed necessity of taking Novolin R before a meal d/t pharmacokinetics and to prevent hypoglycemia. Discussed setting an alarm, putting a sign on his kitchen table, and asking his family members to remind him to take insulin prior to his meal. Patient had to end the call due to another call coming through, discussed following up in a few weeks for continued education.   Unfortunately, prior intolerance to GLP1 and SGLT2 agents, as well as Toujeo. Could consider discussing switch to [place] w/ patient and [name], however, patient historically resistant to insulin changes. Also consider discussing switch to Novolog instead of regular insulin for shorter duration of action to prevent post-meal hypoglycemia w/ taking insulin after a meal  Patient Self Care Activities:   Patient will check blood glucose  daily , document, and provide at future appointments  Patient will take medications as prescribed  Patient will report any questions or concerns to provider   Please see past updates related to this goal by clicking on the "Past Updates" button in the selected goal       "My breathing isn't good" (pt-stated)       Current Barriers:   Hx tobacco abuse, quit smoking ~30 years ago, though diagnosis of COPD/emphysema. Follows with [name] at [facility] pulmonary  Patient notes that he was prescribed prednisone daily + fexofenidine + codeine cough syrup when he saw [name] ~2-3 weeks ago (note not available for review). Notes continued cough, and he has run out of codeine cough syrup. Has f/u with [name] tomorrow  Pharmacist Clinical Goal(s):   Over the next 90 days, patient will work with PharmD and pulmonary  team to address needs related to optimized management of chronic lung disease  Interventions:  Discussed impact of prednisone therapy on blood sugars.   Encouraged continued use of Duonebs QID w/ albuterol rescue inhaler PRN.  Reviewed that cough can often persist for weeks after an infection. Encouraged to discuss continued concerns w/ [name] tomorrow  Patient Self Care Activities:   Self administers medications as prescribed  Please see past updates related to this goal by clicking on the "Past Updates" button in the selected goal          Plan: - Scheduled f/u call in 2 weeks for further diabetic education. Will collaborate w/ RN CM for continued health education   Catie Darnelle Maffucci, PharmD, [place] [phone]

## 2019-02-22 DIAGNOSIS — J449 Chronic obstructive pulmonary disease, unspecified: Secondary | ICD-10-CM | POA: Diagnosis not present

## 2019-02-22 DIAGNOSIS — G4733 Obstructive sleep apnea (adult) (pediatric): Secondary | ICD-10-CM | POA: Diagnosis not present

## 2019-02-22 DIAGNOSIS — R05 Cough: Secondary | ICD-10-CM | POA: Diagnosis not present

## 2019-03-07 ENCOUNTER — Telehealth: Payer: Medicare Other

## 2019-03-07 ENCOUNTER — Ambulatory Visit: Payer: Self-pay | Admitting: Pharmacist

## 2019-03-07 NOTE — Chronic Care Management (AMB) (Signed)
  Chronic Care Management   Note  03/07/2019 Name: Justin Griffith MRN: [id number] DOB: May 05, 1940  Justin Griffith is a 78 y.o. year old male who is a primary care patient of Crissman, Jeannette How, MD. The CCM team was consulted for assistance with chronic disease management and care coordination needs.    Attempted to contact patient for medication management review. Left HIPAA compliant message for patient to return my call at his convenience.   Follow up plan: - Will collaborate w/ scheduling Care Guide to reschedule a phone appointment  Catie Darnelle Maffucci, PharmD, [place] [phone]

## 2019-03-28 ENCOUNTER — Other Ambulatory Visit: Payer: Self-pay

## 2019-03-28 ENCOUNTER — Encounter: Payer: Self-pay | Admitting: Nurse Practitioner

## 2019-03-28 ENCOUNTER — Ambulatory Visit (INDEPENDENT_AMBULATORY_CARE_PROVIDER_SITE_OTHER): Payer: Medicare Other | Admitting: Nurse Practitioner

## 2019-03-28 VITALS — BP 142/82 | HR 61 | Temp 97.7°F

## 2019-03-28 DIAGNOSIS — E1129 Type 2 diabetes mellitus with other diabetic kidney complication: Secondary | ICD-10-CM

## 2019-03-28 DIAGNOSIS — E1159 Type 2 diabetes mellitus with other circulatory complications: Secondary | ICD-10-CM

## 2019-03-28 DIAGNOSIS — R251 Tremor, unspecified: Secondary | ICD-10-CM

## 2019-03-28 DIAGNOSIS — E1169 Type 2 diabetes mellitus with other specified complication: Secondary | ICD-10-CM

## 2019-03-28 DIAGNOSIS — Z794 Long term (current) use of insulin: Secondary | ICD-10-CM

## 2019-03-28 DIAGNOSIS — I739 Peripheral vascular disease, unspecified: Secondary | ICD-10-CM

## 2019-03-28 DIAGNOSIS — J449 Chronic obstructive pulmonary disease, unspecified: Secondary | ICD-10-CM

## 2019-03-28 DIAGNOSIS — R809 Proteinuria, unspecified: Secondary | ICD-10-CM

## 2019-03-28 DIAGNOSIS — I152 Hypertension secondary to endocrine disorders: Secondary | ICD-10-CM

## 2019-03-28 DIAGNOSIS — I10 Essential (primary) hypertension: Secondary | ICD-10-CM

## 2019-03-28 DIAGNOSIS — E1165 Type 2 diabetes mellitus with hyperglycemia: Secondary | ICD-10-CM | POA: Diagnosis not present

## 2019-03-28 DIAGNOSIS — E785 Hyperlipidemia, unspecified: Secondary | ICD-10-CM

## 2019-03-28 LAB — MICROALBUMIN, URINE WAIVED
Creatinine, Urine Waived: 10 mg/dL (ref 10–300)
Microalb, Ur Waived: 150 mg/L — ABNORMAL HIGH (ref 0–19)
Microalb/Creat Ratio: >300 mg/g — ABNORMAL HIGH (ref <30)

## 2019-03-28 NOTE — Assessment & Plan Note (Signed)
Chronic, ongoing with poor control.  Followed by endocrinology.  Continue current collaboration and medication regimen.  Recommend focus on modest weight loss and diet changes.  Will have return in 3 weeks to follow-up after [facility] PCP appointment.

## 2019-03-28 NOTE — Assessment & Plan Note (Signed)
Chronic, ongoing.   Has underlying circulation issues and have recommended he wear compression hose at home + decrease sodium intake.  Continue remainder of medication regimen and adjust as needed + continue collaboration with cardiology and [facility] PCP. Continue to check BP daily at home and document.

## 2019-03-28 NOTE — Assessment & Plan Note (Signed)
Chronic, ongoing.  Followed by pulmonary.  Continue current medication regimen. Continue Mucinex at home.  Return in 3 weeks for follow-up.

## 2019-03-28 NOTE — Patient Instructions (Signed)
Carbohydrate Counting for Diabetes Mellitus, Adult  Carbohydrate counting is a method of keeping track of how many carbohydrates you eat. Eating carbohydrates naturally increases the amount of sugar (glucose) in the blood. Counting how many carbohydrates you eat helps keep your blood glucose within normal limits, which helps you manage your diabetes (diabetes mellitus). It is important to know how many carbohydrates you can safely have in each meal. This is different for every person. A diet and nutrition specialist (registered dietitian) can help you make a meal plan and calculate how many carbohydrates you should have at each meal and snack. Carbohydrates are found in the following foods:  Grains, such as breads and cereals.  Dried beans and soy products.  Starchy vegetables, such as potatoes, peas, and corn.  Fruit and fruit juices.  Milk and yogurt.  Sweets and snack foods, such as cake, cookies, candy, chips, and soft drinks. How do I count carbohydrates? There are two ways to count carbohydrates in food. You can use either of the methods or a combination of both. Reading "Nutrition Facts" on packaged food The "Nutrition Facts" list is included on the labels of almost all packaged foods and beverages in the [place] It includes:  The serving size.  Information about nutrients in each serving, including the grams (g) of carbohydrate per serving. To use the "Nutrition Facts":  Decide how many servings you will have.  Multiply the number of servings by the number of carbohydrates per serving.  The resulting number is the total amount of carbohydrates that you will be having. Learning standard serving sizes of other foods When you eat carbohydrate foods that are not packaged or do not include "Nutrition Facts" on the label, you need to measure the servings in order to count the amount of carbohydrates:  Measure the foods that you will eat with a food scale or measuring cup, if  needed.  Decide how many standard-size servings you will eat.  Multiply the number of servings by 15. Most carbohydrate-rich foods have about 15 g of carbohydrates per serving. ? For example, if you eat 8 oz (170 g) of strawberries, you will have eaten 2 servings and 30 g of carbohydrates (2 servings x 15 g = 30 g).  For foods that have more than one food mixed, such as soups and casseroles, you must count the carbohydrates in each food that is included. The following list contains standard serving sizes of common carbohydrate-rich foods. Each of these servings has about 15 g of carbohydrates:   hamburger bun or  English muffin.   oz (15 mL) syrup.   oz (14 g) jelly.  1 slice of bread.  1 six-inch tortilla.  3 oz (85 g) cooked rice or pasta.  4 oz (113 g) cooked dried beans.  4 oz (113 g) starchy vegetable, such as peas, corn, or potatoes.  4 oz (113 g) hot cereal.  4 oz (113 g) mashed potatoes or  of a large baked potato.  4 oz (113 g) canned or frozen fruit.  4 oz (120 mL) fruit juice.  4-6 crackers.  6 chicken nuggets.  6 oz (170 g) unsweetened dry cereal.  6 oz (170 g) plain fat-free yogurt or yogurt sweetened with artificial sweeteners.  8 oz (240 mL) milk.  8 oz (170 g) fresh fruit or one small piece of fruit.  24 oz (680 g) popped popcorn. Example of carbohydrate counting Sample meal  3 oz (85 g) chicken breast.  6 oz (170 g)   brown rice.  4 oz (113 g) corn.  8 oz (240 mL) milk.  8 oz (170 g) strawberries with sugar-free whipped topping. Carbohydrate calculation 1. Identify the foods that contain carbohydrates: ? Rice. ? Corn. ? Milk. ? Strawberries. 2. Calculate how many servings you have of each food: ? 2 servings rice. ? 1 serving corn. ? 1 serving milk. ? 1 serving strawberries. 3. Multiply each number of servings by 15 g: ? 2 servings rice x 15 g = 30 g. ? 1 serving corn x 15 g = 15 g. ? 1 serving milk x 15 g = 15 g. ? 1  serving strawberries x 15 g = 15 g. 4. Add together all of the amounts to find the total grams of carbohydrates eaten: ? 30 g + 15 g + 15 g + 15 g = 75 g of carbohydrates total. Summary  Carbohydrate counting is a method of keeping track of how many carbohydrates you eat.  Eating carbohydrates naturally increases the amount of sugar (glucose) in the blood.  Counting how many carbohydrates you eat helps keep your blood glucose within normal limits, which helps you manage your diabetes.  A diet and nutrition specialist (registered dietitian) can help you make a meal plan and calculate how many carbohydrates you should have at each meal and snack. This information is not intended to replace advice given to you by your health care provider. Make sure you discuss any questions you have with your health care provider. Document Revised: 10/01/2016 Document Reviewed: 08/21/2015 Elsevier Patient Education  2020 [employer]  

## 2019-03-28 NOTE — Assessment & Plan Note (Signed)
Will plan to refer back to vascular if ongoing edema issues.  Recommend modest weight loss, which would be beneficial.

## 2019-03-28 NOTE — Assessment & Plan Note (Signed)
Followed by endocrinology.  Urine ALB today150 and A:C >300, will consider referral to nephrology in future if worsening.  Continue Losartan for kidney protection.

## 2019-03-28 NOTE — Progress Notes (Addendum)
BP (!) 142/82 (BP Location: Left Arm, Patient Position: Sitting)   Pulse 61   Temp 97.7 F (36.5 C) (Oral)   SpO2 96%    Subjective:    Patient ID: Justin Griffith, male    DOB: May 20, 1940, 79 y.o.   MRN: [id number]  HPI: Justin Griffith is a 79 y.o. male  Chief Complaint  Patient presents with  . Follow-up    2 months   [facility] today.  HYPERTENSION/HLD Continues on Losartan, Clonidine, HCTZ, Carvedilol, Lasix 40 MG, and ASA + Crestor.  BNP at recent visit was 33.6.  Sees [name] for cardiology, sees him in June next.  Continues to have ongoing BLE edema, at baseline with no worsening.  Has been followed by vascular in past for this, does not wear TED hose, refused reporting he does not like tight socks.  He voiced concern about increased urination, which he feels is from his "water pills". Last EF >55% 06/22/2018.  Hypertension status: uncontrolled  Satisfied with current treatment? yes Duration of hypertension: chronic BP monitoring frequency:  daily BP range: 130- 150 range SBP and 70-80 DBP BP medication side effects:  no Medication compliance: good compliance Previous BP meds: Losartan, Clonidine, HCTZ, Carvedilol, Amlodipine, Lasix Aspirin: yes Recurrent headaches: no Visual changes: no Palpitations: no Dyspnea:  At baseline Chest pain: no Lower extremity edema: mild, baseline Dizzy/lightheaded: no   COPD Followed by [name], last seen 02/22/2019.  Appears Prednisone was added 5 MG daily and Tessalon.  He reports no worsening cough or SOB.   COPD status: stable Satisfied with current treatment?: yes Oxygen use: no Dyspnea frequency: at baseline Cough frequency: present, chronic Rescue inhaler frequency:  Uses Duoneb QID Limitation of activity: at times Productive cough: at this time, over past week Last Spirometry: with pulmonary Pneumovax: Up to Date Influenza: Up to Date  DIABETES Last saw endocrinology 02/09/2019 and  A1C 8.6% and was instructed to take some extra R if BS > 170.  IS to have eye exam at [facility] when able.  Does report he is working on weight loss and diet changes, eating less sweets and bread which are his favorites. Hypoglycemic episodes:no Polydipsia/polyuria: yes Visual disturbance: no Chest pain: no Paresthesias: no Glucose Monitoring: yes  Accucheck frequency: Daily  Fasting glucose: 150's  Post prandial:  Evening:  Before meals: Taking Insulin?: no  Long acting insulin:  Short acting insulin: Blood Pressure Monitoring: daily Retinal Examination: Not up to Date Foot Exam: Up to Date Pneumovax: Up to Date Influenza: Up to Date Aspirin: yes   ESSENTIAL TREMOR Saw [name] for initial visit on 02/10/19.  Started on Gabapentin for essential tremor and is to schedule follow-up.  Patient continues to have concerns about Parkinson's, as his father had this.  He is going to speak to his [place] PCP today.  Will look into referral to neurology at [facility].  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  No combat time.  Did spend time overseas in [place], possible exposures.  Relevant past medical, surgical, family and social history reviewed and updated as indicated. Interim medical history since our last visit reviewed. Allergies and medications reviewed and updated.  Review of Systems  Constitutional: Negative for activity change, diaphoresis, fatigue and fever.  Respiratory: Positive for cough, shortness of breath and wheezing. Negative for chest tightness.   Cardiovascular: Positive for leg swelling. Negative for chest pain and palpitations.  Endocrine: Negative for cold  intolerance, heat intolerance, polydipsia, polyphagia and polyuria.  Neurological: Positive for tremors. Negative for dizziness, syncope, weakness, light-headedness, numbness and headaches.  Psychiatric/Behavioral: Negative.     Per HPI unless specifically indicated above     Objective:    BP (!) 142/82 (BP Location:  Left Arm, Patient Position: Sitting)   Pulse 61   Temp 97.7 F (36.5 C) (Oral)   SpO2 96%   Wt Readings from Last 3 Encounters:  01/25/19 (!) 304 lb (137.9 kg)  01/11/19 (!) 308 lb (139.7 kg)  12/14/18 (!) 318 lb (144.2 kg)    Physical Exam Vitals and nursing note reviewed.  Constitutional:      General: He is awake. He is not in acute distress.    Appearance: He is well-developed. He is morbidly obese. He is not ill-appearing.  HENT:     Head: Normocephalic and atraumatic.     Right Ear: Hearing, tympanic membrane, ear canal and external ear normal. No drainage.     Left Ear: Hearing, tympanic membrane, ear canal and external ear normal. No drainage.     Mouth/Throat:     Pharynx: Uvula midline. No pharyngeal swelling, oropharyngeal exudate or posterior oropharyngeal erythema.  Eyes:     General: Lids are normal.        Right eye: No discharge.        Left eye: No discharge.     Conjunctiva/sclera: Conjunctivae normal.     Pupils: Pupils are equal, round, and reactive to light.  Neck:     Thyroid: No thyromegaly.     Vascular: No carotid bruit or JVD.  Cardiovascular:     Rate and Rhythm: Normal rate and regular rhythm.     Heart sounds: Normal heart sounds, S1 normal and S2 normal. No murmur. No gallop.   Pulmonary:     Effort: Pulmonary effort is normal. No accessory muscle usage or respiratory distress.     Breath sounds: Decreased breath sounds present. No wheezing.     Comments: Decreased breath sounds throughout, no whezing.  No SOB with talking or when walked from office door to exam room. Abdominal:     General: Bowel sounds are normal.     Palpations: Abdomen is soft. There is no hepatomegaly or splenomegaly.     Tenderness: There is no abdominal tenderness.  Musculoskeletal:        General: Normal range of motion.     Cervical back: Normal range of motion and neck supple.     Right lower leg: 1+ Pitting Edema present.     Left lower leg: 1+ Pitting Edema  present.  Lymphadenopathy:     Cervical: No cervical adenopathy.  Skin:    General: Skin is warm and dry.     Capillary Refill: Capillary refill takes less than 2 seconds.     Comments: Rubor and xerosis bilateral lower extremity.  Neurological:     Mental Status: He is alert and oriented to person, place, and time.  Psychiatric:        Mood and Affect: Mood normal.        Behavior: Behavior normal. Behavior is cooperative.        Thought Content: Thought content normal.        Judgment: Judgment normal.    Diabetic Foot Exam - Simple   Simple Foot Form Visual Inspection See comments: Yes Sensation Testing See comments: Yes Pulse Check See comments: Yes Comments Pulses 1+ DP and PT bilaterally.  Xerosis bilateral lower legs.  Sensation 7/10 right and 6/10 left.     Results for orders placed or performed in visit on 01/11/19  CBC with Differential/Platelet  Result Value Ref Range   WBC 7.3 3.4 - 10.8 x10E3/uL   RBC 4.85 4.14 - 5.80 x10E6/uL   Hemoglobin 14.8 13.0 - 17.7 g/dL   Hematocrit 44.3 37.5 - 51.0 %   MCV 91 79 - 97 fL   MCH 30.5 26.6 - 33.0 pg   MCHC 33.4 31.5 - 35.7 g/dL   RDW 13.2 11.6 - 15.4 %   Platelets 241 150 - 450 x10E3/uL   Neutrophils 61 Not Estab. %   Lymphs 23 Not Estab. %   Monocytes 11 Not Estab. %   Eos 4 Not Estab. %   Basos 1 Not Estab. %   Neutrophils Absolute 4.5 1.4 - 7.0 x10E3/uL   Lymphocytes Absolute 1.6 0.7 - 3.1 x10E3/uL   Monocytes Absolute 0.8 0.1 - 0.9 x10E3/uL   EOS (ABSOLUTE) 0.3 0.0 - 0.4 x10E3/uL   Basophils Absolute 0.1 0.0 - 0.2 x10E3/uL   Immature Granulocytes 0 Not Estab. %   Immature Grans (Abs) 0.0 0.0 - 0.1 x10E3/uL  Comprehensive metabolic panel  Result Value Ref Range   Glucose 176 (H) 65 - 99 mg/dL   BUN 14 8 - 27 mg/dL   Creatinine, Ser 1.16 0.76 - 1.27 mg/dL   GFR calc non Af Amer 60 >59 mL/min/1.73   GFR calc Af Amer 69 >59 mL/min/1.73   BUN/Creatinine Ratio 12 10 - 24   Sodium 139 134 - 144 mmol/L    Potassium 4.4 3.5 - 5.2 mmol/L   Chloride 98 96 - 106 mmol/L   CO2 27 20 - 29 mmol/L   Calcium 10.0 8.6 - 10.2 mg/dL   Total Protein 6.8 6.0 - 8.5 g/dL   Albumin 4.0 3.7 - 4.7 g/dL   Globulin, Total 2.8 1.5 - 4.5 g/dL   Albumin/Globulin Ratio 1.4 1.2 - 2.2   Bilirubin Total 0.6 0.0 - 1.2 mg/dL   Alkaline Phosphatase 57 39 - 117 IU/L   AST 12 0 - 40 IU/L   ALT 13 0 - 44 IU/L      Assessment & Plan:   Problem List Items Addressed This Visit      Cardiovascular and Mediastinum   Hypertension associated with diabetes (HCC)    Chronic, ongoing.   Has underlying circulation issues and have recommended he wear compression hose at home + decrease sodium intake.  Continue remainder of medication regimen and adjust as needed + continue collaboration with cardiology and [place] PCP. Continue to check BP daily at home and document.        Relevant Orders   Basic Metabolic Panel (BMET)   PAD (peripheral artery disease) ([place])    Will plan to refer back to vascular if ongoing edema issues.  Recommend modest weight loss, which would be beneficial.        Respiratory   COPD (chronic obstructive pulmonary disease) (HCC)    Chronic, ongoing.  Followed by pulmonary.  Continue current medication regimen. Continue Mucinex at home.  Return in 3 weeks for follow-up.        Endocrine   Type 2 diabetes mellitus with hyperglycemia (HCC) - Primary    Chronic, ongoing with poor control.  Followed by endocrinology.  Continue current collaboration and medication regimen.  Recommend focus on modest weight loss and diet changes.  Will have return in 3 weeks to follow-up after [place] PCP appointment.  Relevant Orders   Basic Metabolic Panel (BMET)   Microalbumin, Urine Waived here   Hyperlipidemia associated with type 2 diabetes mellitus (HCC)    Chronic, ongoing.  Continue current medication regimen and adjust as needed.  Lipid panel today,      Relevant Orders   Lipid Panel w/o Chol/HDL Ratio out   Type  2 diabetes mellitus with microalbuminuria ([place])    Followed by endocrinology.  Urine ALB today150 and A:C >300, will consider referral to nephrology in future if worsening.  Continue Losartan for kidney protection.        Other   Tremor    Continue collaboration with neurology and [facility], he will follow-up with provider in 3 weeks to report on [name] visit from today.  Will see if he can get into neurology at [facility].          Follow up plan: Return in about 3 weeks (around 04/18/2019) for Follow-up on [facility] visit.

## 2019-03-28 NOTE — Assessment & Plan Note (Signed)
Chronic, ongoing.  Continue current medication regimen and adjust as needed.  Lipid panel today,

## 2019-03-28 NOTE — Assessment & Plan Note (Signed)
Continue collaboration with neurology and [facility], he will follow-up with provider in 3 weeks to report on [name] visit from today.  Will see if he can get into neurology at [facility].

## 2019-03-29 LAB — BASIC METABOLIC PANEL
BUN/Creatinine Ratio: 11 (ref 10–24)
BUN: 11 mg/dL (ref 8–27)
CO2: 28 mmol/L (ref 20–29)
Calcium: 10.4 mg/dL — ABNORMAL HIGH (ref 8.6–10.2)
Chloride: 100 mmol/L (ref 96–106)
Creatinine, Ser: 0.98 mg/dL (ref 0.76–1.27)
GFR calc Af Amer: 84 mL/min/{1.73_m2} (ref >59)
GFR calc non Af Amer: 73 mL/min/{1.73_m2} (ref >59)
Glucose: 122 mg/dL — ABNORMAL HIGH (ref 65–99)
Potassium: 4.9 mmol/L (ref 3.5–5.2)
Sodium: 139 mmol/L (ref 134–144)

## 2019-03-29 LAB — LIPID PANEL W/O CHOL/HDL RATIO
Cholesterol, Total: 118 mg/dL (ref 100–199)
HDL: 35 mg/dL — ABNORMAL LOW (ref >39)
LDL Chol Calc (NIH): 54 mg/dL (ref 0–99)
Triglycerides: 171 mg/dL — ABNORMAL HIGH (ref 0–149)
VLDL Cholesterol Cal: 29 mg/dL (ref 5–40)

## 2019-03-29 NOTE — Progress Notes (Signed)
Please let [name] know overall kidney and liver function are stable.  Calcium remains slightly elevated, may check some other labs next visit to confirm this.  Cholesterol levels show goal total cholesterol and LDL, but triglycerides slightly elevated.  Will continue to monitor.  We will talk about how [place] visit went at next appointment, hope it went well.  Have a great day!!

## 2019-04-04 ENCOUNTER — Ambulatory Visit (INDEPENDENT_AMBULATORY_CARE_PROVIDER_SITE_OTHER): Payer: Medicare Other | Admitting: Pharmacist

## 2019-04-04 DIAGNOSIS — E1159 Type 2 diabetes mellitus with other circulatory complications: Secondary | ICD-10-CM

## 2019-04-04 DIAGNOSIS — I10 Essential (primary) hypertension: Secondary | ICD-10-CM | POA: Diagnosis not present

## 2019-04-04 DIAGNOSIS — J449 Chronic obstructive pulmonary disease, unspecified: Secondary | ICD-10-CM | POA: Diagnosis not present

## 2019-04-04 DIAGNOSIS — R600 Localized edema: Secondary | ICD-10-CM

## 2019-04-04 DIAGNOSIS — E1165 Type 2 diabetes mellitus with hyperglycemia: Secondary | ICD-10-CM | POA: Diagnosis not present

## 2019-04-04 DIAGNOSIS — I152 Hypertension secondary to endocrine disorders: Secondary | ICD-10-CM

## 2019-04-04 DIAGNOSIS — Z794 Long term (current) use of insulin: Secondary | ICD-10-CM

## 2019-04-04 NOTE — Chronic Care Management (AMB) (Signed)
Chronic Care Management   Follow Up Note   04/04/2019 Name: Justin Griffith MRN: [id number] DOB: 24-Jan-1941  Referred by: [name], NP Reason for referral : Chronic Care Management (Medication Management)   Justin Griffith is a 79 y.o. year old male who is a primary care patient of [name], [name], NP. The CCM team was consulted for assistance with chronic disease management and care coordination needs.     Contacted patient for medication management review.   Review of patient status, including review of consultants reports, relevant laboratory and other test results, and collaboration with appropriate care team members and the patient's provider was performed as part of comprehensive patient evaluation and provision of chronic care management services.    SDOH (Social Determinants of Health) screening performed today: None. See Care Plan for related entries.   Outpatient Encounter Medications as of 04/04/2019  Medication Sig Note  . acyclovir (ZOVIRAX) 400 MG tablet Take 400 mg by mouth 2 (two) times daily.   Marland Kitchen amLODipine (NORVASC) 5 MG tablet Take 1 tablet (5 mg total) by mouth daily. (Patient taking differently: Take 10 mg by mouth daily. )   . aspirin EC 81 MG tablet Take 81 mg by mouth daily.    . carvedilol (COREG) 25 MG tablet Take 1 tablet (25 mg total) by mouth 2 (two) times daily with a meal.   . cetirizine (ZYRTEC) 10 MG tablet Take 10 mg by mouth daily.   . cloNIDine (CATAPRES) 0.2 MG tablet Take 0.2 mg by mouth 2 (two) times daily.   . furosemide (LASIX) 20 MG tablet Take 2 tablets (40 mg total) by mouth daily. 11/29/2018: Taking 20 mg daily   . hydrochlorothiazide (HYDRODIURIL) 25 MG tablet Take 25 mg by mouth daily.   . insulin glargine (LANTUS) 100 UNIT/ML injection Inject 75 Units into the skin daily.  01/11/2019: 79  . losartan (COZAAR) 100 MG tablet Take 100 mg by mouth daily.   . metFORMIN (GLUCOPHAGE) 1000 MG tablet Take 1,000 mg by mouth 2 (two) times  daily with a meal.    . naproxen (NAPROSYN) 500 MG tablet Take 500 mg by mouth 2 (two) times daily with a meal. Arthritis   . rosuvastatin (CRESTOR) 40 MG tablet Take 1 tablet (40 mg total) by mouth at bedtime. (Patient taking differently: Take 40 mg by mouth at bedtime. 20 mg once a day)   . Vitamin D, Cholecalciferol, 25 MCG (1000 UT) CAPS Take 25 mcg by mouth daily.   . [DISCONTINUED] cloNIDine (CATAPRES) 0.1 MG tablet Take 1 tablet (0.1 mg total) by mouth 2 (two) times daily. (Patient taking differently: Take 0.2 mg by mouth 2 (two) times daily. )   . gabapentin (NEURONTIN) 100 MG capsule Take 200 mg by mouth 2 (two) times daily.   . insulin regular (NOVOLIN R) 100 units/mL injection Inject 30 Units into the skin 3 (three) times daily before meals.  01/25/2019: Taking PRN when sugar >150  . ipratropium-albuterol (DUONEB) 0.5-2.5 (3) MG/3ML SOLN Take 3 mLs by nebulization every 6 (six) hours. 02/21/2019: Taking QID   . QVAR REDIHALER 40 MCG/ACT inhaler    . tamsulosin (FLOMAX) 0.4 MG CAPS capsule Take 0.4 mg by mouth daily.   . [DISCONTINUED] fexofenadine (ALLEGRA) 180 MG tablet Take 180 mg by mouth daily.    No facility-administered encounter medications on file as of 04/04/2019.     Goals Addressed            This Visit's Progress  Patient Stated   . PharmD "I don't know much about my diabetes" (pt-stated)       Current Barriers:  . Diabetes: uncontrolled; most recent A1c 8.5%; [facility] and [facility] end[facility]inology ([name]) o However, at last visit [name] noted "[name] =250 which is consistent with an a1c ~5.9. A1c may not be accurate. Will review at next visit when he has new meter". Next appt 06/14/19 . Current antihyperglycemic regimen: Lantus 80 units QAM and Novolin R 25-30 units TID with meals; metformin 1000 mg BID ? Hx significant GI upset and intolerance with Victoza and Trulicity, denies ever having tried Ozempic before ? Hx allergic  rash to [place] ? Notes that he was previously on Humalog and it "made my ankles swell" ? Notes that Lantus "works better" than Toujeo for him . Cardiovascular risk reduction; follows w/ [name] (next app 06/01/2019) o Current hypertensive regimen: amlodipine 5 mg daily (*though upon review, is still taking 10 mg daily), carvedilol 25 mg BID, clonidine 0.2 BID, HCTZ 25 mg, losartan 100 mg daily; furosemide 20 mg daily (though upon med botle review, he has multiple bottles of furosemide at home, possibly noting non-adherence) o Current hyperlipidemia regimen: rosuvastatin 20 mg daily; o Notes continued concern about lower extremity edema. Notes that he has not ever used compression hose, as he dislikes wearing socks. Notes his [place] [facility] provider was mailing him a pair to try  . Essential tremor: follows w/ [name]. Gabapentin was tried, but patient notes he "finished that up" and that the medication did not provide any benefit. F/u with [name] scheduled 04/12/19 . COPD: follows w/ [name]; on Duonebs QID. Noted that he was given a course of Qvar during recent exacerbation. F/u with [name] scheduled 04/06/19  Pharmacist Clinical Goal(s):  Marland Kitchen Over the next 90 days, patient will work with PharmD and primary care provider to address optimized medication management  Interventions: . Extensive comprehensive medication review performed, medication list updated in electronic medical record.  . Reviewed indication for each medication. Providing this information to patient via mail on AVS . Patient notes he needs to dump out his pill box and refill, as he is unsure if he is taking everything appropriately. Will mail patient instructions of which medications to place QAM, which QPM, to aid in medication adherence . Discussed that he needs to ensure adherence to daily furosemide 20 mg daily and a trial of compression stockings for lower extremity edema. He verbalized reluctant agreement.     Patient Self Care Activities:  . Patient will check blood glucose daily , document, and provide at future appointments . Patient will take medications as prescribed . Patient will report any questions or concerns to provider   Please see past updates related to this goal by clicking on the "Past Updates" button in the selected goal          Plan: - Scheduled follow up call 05/09/19  Catie Darnelle Maffucci, PharmD, [place] [phone]

## 2019-04-04 NOTE — Patient Instructions (Addendum)
Visit Information  Justin Griffith,   It was great talking to you today! Here is how I suggest you fill your pill box. Any medications that could be making you sleepy are in the evening (except clonidine, as that one is twice daily. Please do not stop the morning dose of this medication, however).   Morning: - Acyclovir  - Carvedilol - Clonidine - Furosemide - Losartan - Metformin - Vitamin D (also called cholecalciferol)   Evening: - Acyclovir - Aspirin - Amlodipine - Carvedilol - Cetirizine - Clonidine - Metformin - Rosuvastatin - Tamsulosin   Please feel free to call me with any questions or concerns! Our next call is scheduled February 16 at 2 pm.   [name], PharmD, [place] [phone]  Visit Information  Goals Addressed            This Visit's Progress     Patient Stated   . PharmD "I don't know much about my diabetes" (pt-stated)       Current Barriers:  . Diabetes: uncontrolled; most recent A1c 8.5%; [facility] and [facility] endocrinology ([name]) o However, at last visit [name] noted "[name] =250 which is consistent with an a1c ~5.9. A1c may not be accurate. Will review at next visit when he has new meter". Next appt 06/14/19 . Current antihyperglycemic regimen: Lantus 80 units QAM and Novolin R 25-30 units TID with meals; metformin 1000 mg BID ? Hx significant GI upset and intolerance with Victoza and Trulicity, denies ever having tried Ozempic before ? Hx allergic rash to [place] ? Notes that he was previously on Humalog and it "made my ankles swell" ? Notes that Lantus "works better" than Toujeo for him . Cardiovascular risk reduction; follows w/ [name] (next app 06/01/2019) o Current hypertensive regimen: amlodipine 5 mg daily (*though upon review, is still taking 10 mg daily), carvedilol 25 mg BID, clonidine 0.2 BID, HCTZ 25  mg, losartan 100 mg daily; furosemide 20 mg daily (though upon med botle review, he has multiple bottles of furosemide at home, possibly noting non-adherence) o Current hyperlipidemia regimen: rosuvastatin 20 mg daily; o Notes continued concern about lower extremity edema. Notes that he has not ever used compression hose, as he dislikes wearing socks. Notes his [name] was mailing him a pair to try  . Essential tremor: follows w/ [name]. Gabapentin was tried, but patient notes he "finished that up" and that the medication did not provide any benefit. F/u with [name] scheduled 04/12/19 . COPD: follows w/ [name]; on Duonebs QID. Noted that he was given a course of Qvar during recent exacerbation. F/u with [name] scheduled 04/06/19  Pharmacist Clinical Goal(s):  Marland Kitchen Over the next 90 days, patient will work with PharmD and primary care provider to address optimized medication management  Interventions: . Extensive comprehensive medication review performed, medication list updated in electronic medical record.  . Reviewed indication for each medication. Providing this information to patient via mail on AVS . Patient notes he needs to dump out his pill box and refill, as he is unsure if he is taking everything appropriately. Will mail patient instructions of which medications to place QAM, which QPM, to aid in medication adherence . Discussed that he needs to ensure adherence to daily furosemide 20 mg daily and a trial of compression stockings for lower extremity edema. He verbalized reluctant agreement.   Patient Self Care Activities:  . Patient  will check blood glucose daily , document, and provide at future appointments . Patient will take medications as prescribed . Patient will report any questions or concerns to provider   Please see past updates related to this goal by clicking on the "Past Updates" button in the selected goal         Print copy of patient instructions  provided.   Plan: - Scheduled follow up call 05/09/19  Catie Darnelle Maffucci, PharmD, [place] [phone]

## 2019-04-05 ENCOUNTER — Telehealth: Payer: Medicare Other

## 2019-04-06 DIAGNOSIS — R0600 Dyspnea, unspecified: Secondary | ICD-10-CM | POA: Diagnosis not present

## 2019-04-06 DIAGNOSIS — J432 Centrilobular emphysema: Secondary | ICD-10-CM | POA: Diagnosis not present

## 2019-04-06 DIAGNOSIS — G4733 Obstructive sleep apnea (adult) (pediatric): Secondary | ICD-10-CM | POA: Diagnosis not present

## 2019-04-06 DIAGNOSIS — R05 Cough: Secondary | ICD-10-CM | POA: Diagnosis not present

## 2019-04-12 DIAGNOSIS — Z9989 Dependence on other enabling machines and devices: Secondary | ICD-10-CM | POA: Diagnosis not present

## 2019-04-12 DIAGNOSIS — G4733 Obstructive sleep apnea (adult) (pediatric): Secondary | ICD-10-CM | POA: Diagnosis not present

## 2019-04-12 DIAGNOSIS — G250 Essential tremor: Secondary | ICD-10-CM | POA: Diagnosis not present

## 2019-04-12 DIAGNOSIS — R262 Difficulty in walking, not elsewhere classified: Secondary | ICD-10-CM | POA: Diagnosis not present

## 2019-04-13 ENCOUNTER — Telehealth: Payer: Self-pay

## 2019-04-13 NOTE — Telephone Encounter (Signed)
Medical Clearance received from Dr. Silvestre Gunner office to schedule patients colonoscopy.  Patient has decided that he is not ready to schedule his colonoscopy at this time.  He would like to hold off until March or April to schedule with Dr. Vicente Males.  Medical Clearance has been granted by [name].  Reminder has been created to call patient back in March to schedule colonoscopy.  Thanks [employer]

## 2019-04-13 NOTE — Telephone Encounter (Signed)
Patient Pulmonologist Herbon fleming cleared patient to have patient colonoscopy. Called patient to scheduled colonoscopy and states he does not want to have this done right now. Patient does not want to be called again till March or April.

## 2019-04-21 ENCOUNTER — Ambulatory Visit
Admission: RE | Admit: 2019-04-21 | Discharge: 2019-04-21 | Disposition: A | Discharge status: Home / Self Care | Payer: Medicare Other | Source: Ambulatory Visit | Attending: Nurse Practitioner | Admitting: Nurse Practitioner

## 2019-04-21 ENCOUNTER — Encounter: Payer: Self-pay | Admitting: Nurse Practitioner

## 2019-04-21 ENCOUNTER — Other Ambulatory Visit: Payer: Self-pay

## 2019-04-21 ENCOUNTER — Ambulatory Visit (INDEPENDENT_AMBULATORY_CARE_PROVIDER_SITE_OTHER): Payer: Medicare Other | Admitting: Nurse Practitioner

## 2019-04-21 VITALS — BP 145/81 | HR 78 | Temp 97.8°F | Ht 71.0 in | Wt 330.0 lb

## 2019-04-21 DIAGNOSIS — J449 Chronic obstructive pulmonary disease, unspecified: Secondary | ICD-10-CM

## 2019-04-21 DIAGNOSIS — K5900 Constipation, unspecified: Secondary | ICD-10-CM | POA: Insufficient documentation

## 2019-04-21 DIAGNOSIS — Z794 Long term (current) use of insulin: Secondary | ICD-10-CM | POA: Diagnosis not present

## 2019-04-21 DIAGNOSIS — E785 Hyperlipidemia, unspecified: Secondary | ICD-10-CM | POA: Diagnosis not present

## 2019-04-21 DIAGNOSIS — E1165 Type 2 diabetes mellitus with hyperglycemia: Secondary | ICD-10-CM | POA: Diagnosis not present

## 2019-04-21 DIAGNOSIS — E538 Deficiency of other specified B group vitamins: Secondary | ICD-10-CM | POA: Diagnosis not present

## 2019-04-21 DIAGNOSIS — R4182 Altered mental status, unspecified: Secondary | ICD-10-CM | POA: Diagnosis not present

## 2019-04-21 DIAGNOSIS — R404 Transient alteration of awareness: Secondary | ICD-10-CM | POA: Insufficient documentation

## 2019-04-21 DIAGNOSIS — E1169 Type 2 diabetes mellitus with other specified complication: Secondary | ICD-10-CM | POA: Diagnosis not present

## 2019-04-21 DIAGNOSIS — K5901 Slow transit constipation: Secondary | ICD-10-CM | POA: Diagnosis not present

## 2019-04-21 MED ORDER — GADOBUTROL 1 MMOL/ML IV SOLN
10.0000 mL | Freq: Once | INTRAVENOUS | Status: AC | PRN
  Administered 2019-04-21: 16:00:00 10 mL via INTRAVENOUS

## 2019-04-21 NOTE — Assessment & Plan Note (Addendum)
Episode yesterday of slurred speech, numbness to hands, imbalance, and headache lasting 3-4 minutes.  Observed by his wife and gas station tech.  No further episodes.  Neuro exam within normal limits today.  Discussed with him his current risk factors.  Will obtain STAT MRI brain to assess, concern for TIA or stroke.  Labs to include CMP, CBC, and lipid panel.  He has follow-up with neurology next week per his report. Have advised him if ANY similar symptoms present over weekend he is to immediately go to ER for evaluation.  He was able to verbalize this plan back to provider.  DDX: stroke, TIA, hypoglycemia, seizure.  Return in one week.

## 2019-04-21 NOTE — Assessment & Plan Note (Signed)
Chronic, ongoing.  Followed by pulmonary.  Continue current medication regimen. Continue Mucinex at home.

## 2019-04-21 NOTE — Progress Notes (Signed)
Spoke to patient on phone and reviewed results with him at length.  He has visit with [name] in neurology next week.  No stroke or hemorrhage noted, but chronic microvascular changes noted, discussed with patient.

## 2019-04-21 NOTE — Assessment & Plan Note (Signed)
Chronic, ongoing with poor control.  Followed by endocrinology.  Continue current collaboration and medication regimen.  Recommend focus on modest weight loss and diet changes.  Reiterated not to take morning short-acting insulin if he does not eat breakfast, as this is leading to low BS episodes.

## 2019-04-21 NOTE — Progress Notes (Signed)
BP (!) 145/81   Pulse 78   Temp 97.8 F (36.6 C) (Oral)   Ht 5\' 11"  (1.803 m)   Wt (!) 330 lb (149.7 kg)   SpO2 95%   BMI 46.03 kg/m    Subjective:    Patient ID: Justin Griffith, male    DOB: 11/08/1940, 79 y.o.   MRN: [id number]  HPI: Justin Griffith is a 79 y.o. male  Chief Complaint  Patient presents with  . Follow-up    VA visit   FOLLOW-UP ON RECENT VA VISIT & RECENT ALTERED MENTAL STATUS (SLURRED SPEECH): He reports this visit was a Theatre manager of time" at the [name], but he did go to specialists after this [place] visit.  Went and saw neurology and they changed dose of Gabapentin to 300 MG TID + advised him to take Lasix around 6 pm.  He reports worsening tremors, was initially in left hand and now in right hand.  Neurology has ordered EMG of lower extremity for neuropathy and a referral to PT placed.    States he was recently at gas station, yesterday, and had episode of not being able to feel his hands, then had some slurred speech which his wife and the tech at gas station noted.  This episode lasted 3-4 minutes and then he returned to baseline, but was a little woozy.  During episode he felt like he could not talk.  Was able to drive without difficulty and get home, but did sleep for 6-7 hours.  At the time he felt off balance.  Denies any incontinence episode or facial droop during episode.  Last A1C with [name] was 8.6% in November.  His BS at home have ranged from 130-140, does occasionally get them in 50-60.  When he gets low BS he starts sweating, gets hot, and gets dizzy.  He reports he did not feel like that at the gas station.  He did have a headache, went all the way to back of head.  He took two ASA when he got home and laid down, pain went away.  He reports not eating this morning, but he did take his morning Novolin 30 units and now feels his sugar is low.  Prior to this episode he had received Covid vaccine day before.  Has had no further episodes like this since initial  even yesterday.   Recurrent headaches: no Visual changes: occasional spots when he looks at computer a long time Palpitations: no Dyspnea: no Chest pain: no Lower extremity edema: yes Dizzy/lightheaded: no, denies any dizziness during episode  COPD Saw 04/06/2019 pulmonary they refilled Prednisone 5 MG daily.  He reports use of his CPAP nightly.   COPD status: stable Satisfied with current treatment?: yes Oxygen use: no Dyspnea frequency: occasional Cough frequency: baseline Rescue inhaler frequency:  rarely Limitation of activity: no Productive cough: none Last Spirometry: with pulmonary Pneumovax: Up to Date Influenza: Up to Date   CONSTIPATION: Reports his stools are as hard as a rock, grunting a good 5-10 minutes before stool comes open.  Felt like it "ripped him open" and there was scant blood when he wiped.  After awhile he was able to get stool to come out smoothly.  Denies abdominal pain or blood in stool.  Has had issues with this for past few months with occasional constipation.  Does not take any medication for this at home. Fever: no Nausea: no Vomiting: no Weight loss: no Decreased appetite: no Diarrhea: no Constipation: yes  Blood in stool: no Heartburn: no Jaundice: no Rash: no Dysuria/urinary frequency: no Hematuria: no History of sexually transmitted disease: no Recurrent NSAID use: no   Relevant past medical, surgical, family and social history reviewed and updated as indicated. Interim medical history since our last visit reviewed. Allergies and medications reviewed and updated.  Review of Systems  Constitutional: Negative for activity change, diaphoresis, fatigue and fever.  Respiratory: Positive for cough (at baseline) and wheezing (baseline). Negative for chest tightness and shortness of breath.   Cardiovascular: Negative for chest pain, palpitations and leg swelling.  Gastrointestinal: Positive for constipation. Negative for abdominal distention,  abdominal pain, blood in stool, diarrhea, nausea, rectal pain and vomiting.  Endocrine: Negative for cold intolerance, heat intolerance, polydipsia, polyphagia and polyuria.  Neurological: Positive for tremors, speech difficulty (yesterday for 3-4 minutes), numbness (baseline, but worse yesterday in hands) and headaches (yesterday during episode). Negative for dizziness, seizures, syncope, facial asymmetry, weakness and light-headedness.  Psychiatric/Behavioral: Negative.     Per HPI unless specifically indicated above     Objective:    BP (!) 145/81   Pulse 78   Temp 97.8 F (36.6 C) (Oral)   Ht 5\' 11"  (1.803 m)   Wt (!) 330 lb (149.7 kg)   SpO2 95%   BMI 46.03 kg/m   Wt Readings from Last 3 Encounters:  04/21/19 (!) 330 lb (149.7 kg)  01/25/19 (!) 304 lb (137.9 kg)  01/11/19 (!) 308 lb (139.7 kg)    Physical Exam Vitals and nursing note reviewed.  Constitutional:      General: He is awake. He is not in acute distress.    Appearance: He is well-developed. He is morbidly obese. He is not ill-appearing.  HENT:     Head: Normocephalic and atraumatic.     Right Ear: Hearing normal. No drainage.     Left Ear: Hearing normal. No drainage.  Eyes:     General: Lids are normal.        Right eye: No discharge.        Left eye: No discharge.     Extraocular Movements: Extraocular movements intact.     Conjunctiva/sclera: Conjunctivae normal.     Pupils: Pupils are equal, round, and reactive to light.     Visual Fields: Right eye visual fields normal and left eye visual fields normal.  Neck:     Thyroid: No thyromegaly.     Vascular: No carotid bruit or JVD.  Cardiovascular:     Rate and Rhythm: Normal rate and regular rhythm.     Heart sounds: Normal heart sounds, S1 normal and S2 normal. No murmur. No gallop.   Pulmonary:     Effort: Pulmonary effort is normal. No accessory muscle usage or respiratory distress.     Breath sounds: Decreased breath sounds present.      Comments: Diminished breath sounds throughout with occasional expiratory wheezes noted. Abdominal:     General: Bowel sounds are normal.     Palpations: Abdomen is soft.     Tenderness: There is no abdominal tenderness.  Musculoskeletal:        General: Normal range of motion.     Cervical back: Normal range of motion and neck supple.     Right lower leg: 2+ Pitting Edema present.     Left lower leg: 2+ Pitting Edema present.  Skin:    General: Skin is warm and dry.     Capillary Refill: Capillary refill takes less than 2 seconds.  Findings: No rash.  Neurological:     Mental Status: He is alert and oriented to person, place, and time.     Cranial Nerves: Cranial nerves are intact.     Sensory: Sensation is intact.     Motor: Motor function is intact.     Coordination: Coordination is intact.     Gait: Gait is intact.     Deep Tendon Reflexes:     Reflex Scores:      Brachioradialis reflexes are 1+ on the right side and 1+ on the left side.      Patellar reflexes are 1+ on the right side and 1+ on the left side. Psychiatric:        Attention and Perception: Attention normal.        Mood and Affect: Mood normal.        Speech: Speech normal.        Behavior: Behavior normal. Behavior is cooperative.        Thought Content: Thought content normal.        Judgment: Judgment normal.     Results for orders placed or performed in visit on [id number]  Basic Metabolic Panel (BMET)  Result Value Ref Range   Glucose 122 (H) 65 - 99 mg/dL   BUN 11 8 - 27 mg/dL   Creatinine, Ser 0.98 0.76 - 1.27 mg/dL   GFR calc non Af Amer 73 >59 mL/min/1.73   GFR calc Af Amer 84 >59 mL/min/1.73   BUN/Creatinine Ratio 11 10 - 24   Sodium 139 134 - 144 mmol/L   Potassium 4.9 3.5 - 5.2 mmol/L   Chloride 100 96 - 106 mmol/L   CO2 28 20 - 29 mmol/L   Calcium 10.4 (H) 8.6 - 10.2 mg/dL  Lipid Panel w/o Chol/HDL Ratio out  Result Value Ref Range   Cholesterol, Total 118 100 - 199 mg/dL    Triglycerides 171 (H) 0 - 149 mg/dL   HDL 35 (L) >39 mg/dL   VLDL Cholesterol Cal 29 5 - 40 mg/dL   LDL Chol Calc (NIH) 54 0 - 99 mg/dL  Microalbumin, Urine Waived here  Result Value Ref Range   Microalb, Ur Waived 150 (H) 0 - 19 mg/L   Creatinine, Urine Waived 10 10 - 300 mg/dL   Microalb/Creat Ratio >300 (H) <30 mg/g      Assessment & Plan:   Problem List Items Addressed This Visit      Respiratory   COPD (chronic obstructive pulmonary disease) (HCC)    Chronic, ongoing.  Followed by pulmonary.  Continue current medication regimen. Continue Mucinex at home.          Endocrine   Type 2 diabetes mellitus with hyperglycemia (HCC)    Chronic, ongoing with poor control.  Followed by endocrinology.  Continue current collaboration and medication regimen.  Recommend focus on modest weight loss and diet changes.  Reiterated not to take morning short-acting insulin if he does not eat breakfast, as this is leading to low BS episodes.          Other   Transient alteration of awareness - Primary    Episode yesterday of slurred speech, numbness to hands, imbalance, and headache lasting 3-4 minutes.  Observed by his wife and gas station tech.  No further episodes.  Neuro exam within normal limits today.  Discussed with him his current risk factors.  Will obtain STAT MRI brain to assess, concern for TIA or stroke.  Labs to include CMP,  CBC, and lipid panel.  He has follow-up with neurology next week per his report. Have advised him if ANY similar symptoms present over weekend he is to immediately go to ER for evaluation.  He was able to verbalize this plan back to provider.  DDX: stroke, TIA, hypoglycemia, seizure.  Return in one week.      Relevant Orders   Comprehensive metabolic panel   Lipid Panel w/o Chol/HDL Ratio   CBC with Differential/Platelet   MR Brain W Wo Contrast   Constipation    New onset.  Recommend use of daily Miralax at home and may start taking Senn-Colace tablet, start  with one a day and if no benefit may take one tablet twice a day.  Recommend increased fluid intake and high fiber diet.  Return to office for worsening or continued.       Other Visit Diagnoses    B12 deficiency       Reports history of low B12 level, will check this today and start supplement as needed.   Relevant Orders   Vitamin B12      Time: 30 minutes, >50% spent counseling on altered mental status and education on when to present to ER + discussion on hypoglycemia   Follow up plan: Return in about 1 week (around 04/28/2019) for Neuro check.

## 2019-04-21 NOTE — Assessment & Plan Note (Signed)
Reports 100% use of this at night.  Recommend use of this when napping during day too.

## 2019-04-21 NOTE — Patient Instructions (Signed)
Start taking Miralax daily in morning and then Docusate-senna in morning or at night, start with one tablet daily and then may go up to one tablet in morning and one at night.   Constipation, Adult Constipation is when a person:  Poops (has a bowel movement) fewer times in a week than normal.  Has a hard time pooping.  Has poop that is dry, hard, or bigger than normal. Follow these instructions at home: Eating and drinking   Eat foods that have a lot of fiber, such as: ? Fresh fruits and vegetables. ? Whole grains. ? Beans.  Eat less of foods that are high in fat, low in fiber, or overly processed, such as: ? [place] fries. ? Hamburgers. ? Cookies. ? Candy. ? Soda.  Drink enough fluid to keep your pee (urine) clear or pale yellow. General instructions  Exercise regularly or as told by your doctor.  Go to the restroom when you feel like you need to poop. Do not hold it in.  Take over-the-counter and prescription medicines only as told by your doctor. These include any fiber supplements.  Do pelvic floor retraining exercises, such as: ? Doing deep breathing while relaxing your lower belly (abdomen). ? Relaxing your pelvic floor while pooping.  Watch your condition for any changes.  Keep all follow-up visits as told by your doctor. This is important. Contact a doctor if:  You have pain that gets worse.  You have a fever.  You have not pooped for 4 days.  You throw up (vomit).  You are not hungry.  You lose weight.  You are bleeding from the anus.  You have thin, pencil-like poop (stool). Get help right away if:  You have a fever, and your symptoms suddenly get worse.  You leak poop or have blood in your poop.  Your belly feels hard or bigger than normal (is bloated).  You have very bad belly pain.  You feel dizzy or you faint. This information is not intended to replace advice given to you by your health care provider. Make sure you discuss any  questions you have with your health care provider. Document Revised: 02/19/2017 Document Reviewed: 08/28/2015 Elsevier Patient Education  2020 [employer].

## 2019-04-21 NOTE — Assessment & Plan Note (Signed)
New onset.  Recommend use of daily Miralax at home and may start taking Senn-Colace tablet, start with one a day and if no benefit may take one tablet twice a day.  Recommend increased fluid intake and high fiber diet.  Return to office for worsening or continued.

## 2019-04-22 ENCOUNTER — Telehealth: Payer: Self-pay | Admitting: Family Medicine

## 2019-04-22 LAB — COMPREHENSIVE METABOLIC PANEL
ALT: 16 IU/L (ref 0–44)
AST: 11 IU/L (ref 0–40)
Albumin/Globulin Ratio: 1.6 (ref 1.2–2.2)
Albumin: 4 g/dL (ref 3.7–4.7)
Alkaline Phosphatase: 48 IU/L (ref 39–117)
BUN/Creatinine Ratio: 16 (ref 10–24)
BUN: 19 mg/dL (ref 8–27)
Bilirubin Total: 0.4 mg/dL (ref 0.0–1.2)
CO2: 27 mmol/L (ref 20–29)
Calcium: 10.6 mg/dL — ABNORMAL HIGH (ref 8.6–10.2)
Chloride: 100 mmol/L (ref 96–106)
Creatinine, Ser: 1.16 mg/dL (ref 0.76–1.27)
GFR calc Af Amer: 69 mL/min/{1.73_m2} (ref >59)
GFR calc non Af Amer: 60 mL/min/{1.73_m2} (ref >59)
Globulin, Total: 2.5 g/dL (ref 1.5–4.5)
Glucose: 33 mg/dL — CL (ref 65–99)
Potassium: 4.3 mmol/L (ref 3.5–5.2)
Sodium: 142 mmol/L (ref 134–144)
Total Protein: 6.5 g/dL (ref 6.0–8.5)

## 2019-04-22 LAB — CBC WITH DIFFERENTIAL/PLATELET
Basophils Absolute: 0.1 10*3/uL (ref 0.0–0.2)
Basos: 1 %
EOS (ABSOLUTE): 0.2 10*3/uL (ref 0.0–0.4)
Eos: 2 %
Hematocrit: 46.2 % (ref 37.5–51.0)
Hemoglobin: 15.7 g/dL (ref 13.0–17.7)
Immature Grans (Abs): 0.1 10*3/uL (ref 0.0–0.1)
Immature Granulocytes: 1 %
Lymphocytes Absolute: 1.6 10*3/uL (ref 0.7–3.1)
Lymphs: 17 %
MCH: 31.8 pg (ref 26.6–33.0)
MCHC: 34 g/dL (ref 31.5–35.7)
MCV: 94 fL (ref 79–97)
Monocytes Absolute: 0.8 10*3/uL (ref 0.1–0.9)
Monocytes: 9 %
Neutrophils Absolute: 6.8 10*3/uL (ref 1.4–7.0)
Neutrophils: 70 %
Platelets: 259 10*3/uL (ref 150–450)
RBC: 4.93 x10E6/uL (ref 4.14–5.80)
RDW: 13.5 % (ref 11.6–15.4)
WBC: 9.5 10*3/uL (ref 3.4–10.8)

## 2019-04-22 LAB — LIPID PANEL W/O CHOL/HDL RATIO
Cholesterol, Total: 108 mg/dL (ref 100–199)
HDL: 36 mg/dL — ABNORMAL LOW (ref >39)
LDL Chol Calc (NIH): 31 mg/dL (ref 0–99)
Triglycerides: 269 mg/dL — ABNORMAL HIGH (ref 0–149)
VLDL Cholesterol Cal: 41 mg/dL — ABNORMAL HIGH (ref 5–40)

## 2019-04-22 LAB — VITAMIN B12: Vitamin B-12: 194 pg/mL — ABNORMAL LOW (ref 232–1245)

## 2019-04-22 NOTE — Telephone Encounter (Signed)
Covering after hours nurse line call, received call around 945 today 04/22/19, patient Justin Griffith 12-Oct-1940 had labs yesterday 1/29 that showed critical glucose of 33, looks like lab was in afternoon, he is T2DM and takes insulin. I called him today and checked on him, he has been monitoring sugars since, and said they have been 150-190, he has been still using insulin, he said usually sugar runs higher not lower, he did not endorse any acute hypoglycemia, reviewed this with him and advised him to use caution to avoid hypoglycemia. Possibly hypoglycemia events could be related to recent transient event. Will forward to PCP for review.  Nobie Putnam, [facility] 04/22/2019, 10:09 AM

## 2019-04-23 ENCOUNTER — Encounter: Payer: Self-pay | Admitting: Nurse Practitioner

## 2019-04-23 DIAGNOSIS — E538 Deficiency of other specified B group vitamins: Secondary | ICD-10-CM | POA: Insufficient documentation

## 2019-04-23 NOTE — Telephone Encounter (Signed)
Thank you.  Noted this.  We have been working with him, chronic care team and I.  He continues to take his short acting insulin in morning and then does not eat, we have all been reiterating not to do this even if his sugar is elevated.  We have been reiterating with him to take short acting and then eat a meal, not to go without meal.  We will continue to work with him on this.  Thank you for reaching out to him and me.

## 2019-04-23 NOTE — Progress Notes (Signed)
Good morning.  Please let Justin Griffith know his labs have returned.  I know Dr. Marlene Bast contacted him about his low sugar.  I do want to reiterate the conversation the chronic care team and I have had with him, DO NO take your morning Novolin 30 units if you are not going to eat.  This could be very dangerous and lead to a very low sugar, as it did on these labs, which could lead to you having transient episodes like you did recently at gas station.  Your B12 level is low, this can lead to hand and feet numbness like you have.  Start taking Vitamin B12 1000 MCG daily by mouth, this you can get in the vitamin section.  Remainder of labs remain at your baseline.  IF any questions, let me know.

## 2019-04-25 ENCOUNTER — Ambulatory Visit (INDEPENDENT_AMBULATORY_CARE_PROVIDER_SITE_OTHER): Payer: Medicare Other | Admitting: Nurse Practitioner

## 2019-04-25 ENCOUNTER — Encounter: Payer: Self-pay | Admitting: Nurse Practitioner

## 2019-04-25 ENCOUNTER — Other Ambulatory Visit: Payer: Self-pay

## 2019-04-25 DIAGNOSIS — R404 Transient alteration of awareness: Secondary | ICD-10-CM

## 2019-04-25 NOTE — Assessment & Plan Note (Addendum)
Acute with no further episodes.  MRI obtained and showed no infarction or hemorrhage, did show periventricular and subcortical white matter changes mildly advanced for age.  Discussed with him and he wishes to further review with neurology next week.  Glucose on recent labs 33, again reiterated to him not to take his Novolin in morning unless he eats.  Concern recent event was related to possible hypoglycemia.  Will continue to work with CCM team and endocrinology on diabetes and insulin care with risk for hypoglycemia -- he reports often noticing low sugars, however concern he may not be consistently noting these episodes.  Return in March after endo and cardiology visits.  Return sooner if worsening or further episodes.

## 2019-04-25 NOTE — Patient Instructions (Signed)
Hypoglycemia Hypoglycemia is when the sugar (glucose) level in your blood is too low. Signs of low blood sugar may include:  Feeling: ? Hungry. ? Worried or nervous (anxious). ? Sweaty and clammy. ? Confused. ? Dizzy. ? Sleepy. ? Sick to your stomach (nauseous).  Having: ? A fast heartbeat. ? A headache. ? A change in your vision. ? Tingling or no feeling (numbness) around your mouth, lips, or tongue. ? Jerky movements that you cannot control (seizure).  Having trouble with: ? Moving (coordination). ? Sleeping. ? Passing out (fainting). ? Getting upset easily (irritability). Low blood sugar can happen to people who have diabetes and people who do not have diabetes. Low blood sugar can happen quickly, and it can be an emergency. Treating low blood sugar Low blood sugar is often treated by eating or drinking something sugary right away, such as:  Fruit juice, 4-6 oz (120-150 mL).  Regular soda (not diet soda), 4-6 oz (120-150 mL).  Low-fat milk, 4 oz (120 mL).  Several pieces of hard candy.  Sugar or honey, 1 Tbsp (15 mL). Treating low blood sugar if you have diabetes If you can think clearly and swallow safely, follow the 15:15 rule:  Take 15 grams of a fast-acting carb (carbohydrate). Talk with your doctor about how much you should take.  Always keep a source of fast-acting carb with you, such as: ? Sugar tablets (glucose pills). Take 3-4 pills. ? 6-8 pieces of hard candy. ? 4-6 oz (120-150 mL) of fruit juice. ? 4-6 oz (120-150 mL) of regular (not diet) soda. ? 1 Tbsp (15 mL) honey or sugar.  Check your blood sugar 15 minutes after you take the carb.  If your blood sugar is still at or below 70 mg/dL (3.9 mmol/L), take 15 grams of a carb again.  If your blood sugar does not go above 70 mg/dL (3.9 mmol/L) after 3 tries, get help right away.  After your blood sugar goes back to normal, eat a meal or a snack within 1 hour.  Treating very low blood sugar If your  blood sugar is at or below 54 mg/dL (3 mmol/L), you have very low blood sugar (severe hypoglycemia). This may also cause:  Passing out.  Jerky movements you cannot control (seizure).  Losing consciousness (coma). This is an emergency. Do not wait to see if the symptoms will go away. Get medical help right away. Call your local emergency services (911 in the [place] Do not drive yourself to the hospital. If you have very low blood sugar and you cannot eat or drink, you may need a glucagon shot (injection). A family member or friend should learn how to check your blood sugar and how to give you a glucagon shot. Ask your doctor if you need to have a glucagon shot kit at home. Follow these instructions at home: General instructions  Take over-the-counter and prescription medicines only as told by your doctor.  Stay aware of your blood sugar as told by your doctor.  Limit alcohol intake to no more than 1 drink a day for nonpregnant women and 2 drinks a day for men. One drink equals 12 oz of beer (355 mL), 5 oz of wine (148 mL), or 1 oz of hard liquor (44 mL).  Keep all follow-up visits as told by your doctor. This is important. If you have diabetes:   Follow your diabetes care plan as told by your doctor. Make sure you: ? Know the signs of low blood sugar. ?  Take your medicines as told. ? Follow your exercise and meal plan. ? Eat on time. Do not skip meals. ? Check your blood sugar as often as told by your doctor. Always check it before and after exercise. ? Follow your sick day plan when you cannot eat or drink normally. Make this plan ahead of time with your doctor.  Share your diabetes care plan with: ? Your work or school. ? People you live with.  Check your pee (urine) for ketones: ? When you are sick. ? As told by your doctor.  Carry a card or wear jewelry that says you have diabetes. Contact a doctor if:  You have trouble keeping your blood sugar in your target  range.  You have low blood sugar often. Get help right away if:  You still have symptoms after you eat or drink something sugary.  Your blood sugar is at or below 54 mg/dL (3 mmol/L).  You have jerky movements that you cannot control.  You pass out. These symptoms may be an emergency. Do not wait to see if the symptoms will go away. Get medical help right away. Call your local emergency services (911 in the [place] Do not drive yourself to the hospital. Summary  Hypoglycemia happens when the level of sugar (glucose) in your blood is too low.  Low blood sugar can happen to people who have diabetes and people who do not have diabetes. Low blood sugar can happen quickly, and it can be an emergency.  Make sure you know the signs of low blood sugar and know how to treat it.  Always keep a source of sugar (fast-acting carb) with you to treat low blood sugar. This information is not intended to replace advice given to you by your health care provider. Make sure you discuss any questions you have with your health care provider. Document Revised: 06/30/2018 Document Reviewed: 04/12/2015 Elsevier Patient Education  2020 [employer]  

## 2019-04-25 NOTE — Progress Notes (Signed)
BP 140/83   Pulse 70   Temp 97.9 F (36.6 C) (Oral)   SpO2 90%    Subjective:    Patient ID: Justin Griffith, male    DOB: [birth year], 79 y.o.   MRN: [id number]  HPI: Justin Griffith is a 79 y.o. male  Chief Complaint  Patient presents with  . Neurologic Problem    1 week f/up    TRANSIENT ALTERATION OF AWARENESS: Report episode last week of episode at gas station where he had slurred speech and felt woozy.  This episode lasted 3-4 minutes and then he returned to baseline, but was a little woozy.  During episode he felt like he could not talk.  Was able to drive without difficulty and get home, but did sleep for 6-7 hours.  At the time he felt off balance.  Denies any incontinence episode or facial droop during episode. Last A1C with [name] was 8.6% in November.  His BS at home have ranged from 130-140, does occasionally get them in 50-60.  When he gets low BS he starts sweating, gets hot, and gets dizzy.  He reports he did not feel like that at the gas station.  He did have a headache, went all the way to back of head.  He took two ASA when he got home and laid down, pain went away.  He reports not eating this morning, but he did take his morning Novolin 30 units and now feels his sugar is low.  Prior to this episode he had received Covid vaccine day before.    MRI obtained and showed no infarction or hemorrhage, did show periventricular and subcortical white matter changes mildly advanced for age.  He follows neurology and is to discuss with them upcoming week.  Labs did return showing glucose 33, he has endorsed in past taking morning insulin and not eating at times.  This habit has been reiterated by CCM team and this provider not to do, due to short acting effect of Novolin.  Has had no further episodes like episode at gas station.    -Today he reports he gets his coffee in morning + check BS and if 150 does "not fool with it", then he rechecks it in 10 minutes.  If over 200,  measures out what he needs insulin wise and then does not eat breakfast.  Does take Novolin at lunch and dinner, then eats.  Lantus takes once a day, takes consistently.  At times does miss Novolin dose at night after snacking and reports this is when sugars are >200 in morning.     Relevant past medical, surgical, family and social history reviewed and updated as indicated. Interim medical history since our last visit reviewed. Allergies and medications reviewed and updated.  Review of Systems  Constitutional: Negative for activity change, diaphoresis, fatigue and fever.  Respiratory: Positive for cough (baseline) and shortness of breath (baseline). Negative for chest tightness and wheezing.   Cardiovascular: Negative for chest pain, palpitations and leg swelling.  Endocrine: Negative for cold intolerance, heat intolerance, polydipsia, polyphagia and polyuria.  Neurological: Negative.   Psychiatric/Behavioral: Negative.     Per HPI unless specifically indicated above     Objective:    BP 140/83   Pulse 70   Temp 97.9 F (36.6 C) (Oral)   SpO2 90%   Wt Readings from Last 3 Encounters:  04/21/19 (!) 330 lb (149.7 kg)  01/25/19 (!) 304 lb (137.9 kg)  01/11/19 (!) 308 lb (  139.7 kg)    Physical Exam Vitals and nursing note reviewed.  Constitutional:      General: He is awake. He is not in acute distress.    Appearance: He is well-developed. He is morbidly obese. He is not ill-appearing.  HENT:     Head: Normocephalic and atraumatic.     Right Ear: Hearing normal. No drainage.     Left Ear: Hearing normal. No drainage.  Eyes:     General: Lids are normal.        Right eye: No discharge.        Left eye: No discharge.     Extraocular Movements: Extraocular movements intact.     Conjunctiva/sclera: Conjunctivae normal.     Pupils: Pupils are equal, round, and reactive to light.     Visual Fields: Right eye visual fields normal and left eye visual fields normal.  Neck:      Thyroid: No thyromegaly.     Vascular: No carotid bruit or JVD.  Cardiovascular:     Rate and Rhythm: Normal rate and regular rhythm.     Heart sounds: Normal heart sounds, S1 normal and S2 normal. No murmur. No gallop.   Pulmonary:     Effort: Pulmonary effort is normal. No accessory muscle usage or respiratory distress.     Breath sounds: Decreased breath sounds present.     Comments: Diminished breath sounds throughout with occasional expiratory wheezes noted. Abdominal:     General: Bowel sounds are normal.     Palpations: Abdomen is soft.     Tenderness: There is no abdominal tenderness.  Musculoskeletal:        General: Normal range of motion.     Cervical back: Normal range of motion and neck supple.     Right lower leg: 2+ Pitting Edema present.     Left lower leg: 2+ Pitting Edema present.  Skin:    General: Skin is warm and dry.     Capillary Refill: Capillary refill takes less than 2 seconds.     Findings: No rash.  Neurological:     Mental Status: He is alert and oriented to person, place, and time.     Cranial Nerves: Cranial nerves are intact.     Sensory: Sensation is intact.     Motor: Motor function is intact.     Coordination: Coordination is intact.     Gait: Gait is intact.     Deep Tendon Reflexes:     Reflex Scores:      Brachioradialis reflexes are 1+ on the right side and 1+ on the left side.      Patellar reflexes are 1+ on the right side and 1+ on the left side. Psychiatric:        Attention and Perception: Attention normal.        Mood and Affect: Mood normal.        Speech: Speech normal.        Behavior: Behavior normal. Behavior is cooperative.        Thought Content: Thought content normal.        Judgment: Judgment normal.     Results for orders placed or performed in visit on 04/21/19  Comprehensive metabolic panel  Result Value Ref Range   Glucose 33 (LL) 65 - 99 mg/dL   BUN 19 8 - 27 mg/dL   Creatinine, Ser 1.16 0.76 - 1.27 mg/dL    GFR calc non Af Amer 60 >59 mL/min/1.73   GFR  calc Af Amer 69 >59 mL/min/1.73   BUN/Creatinine Ratio 16 10 - 24   Sodium 142 134 - 144 mmol/L   Potassium 4.3 3.5 - 5.2 mmol/L   Chloride 100 96 - 106 mmol/L   CO2 27 20 - 29 mmol/L   Calcium 10.6 (H) 8.6 - 10.2 mg/dL   Total Protein 6.5 6.0 - 8.5 g/dL   Albumin 4.0 3.7 - 4.7 g/dL   Globulin, Total 2.5 1.5 - 4.5 g/dL   Albumin/Globulin Ratio 1.6 1.2 - 2.2   Bilirubin Total 0.4 0.0 - 1.2 mg/dL   Alkaline Phosphatase 48 39 - 117 IU/L   AST 11 0 - 40 IU/L   ALT 16 0 - 44 IU/L  Vitamin B12  Result Value Ref Range   Vitamin B-12 194 (L) 232 - 1,245 pg/mL  Lipid Panel w/o Chol/HDL Ratio  Result Value Ref Range   Cholesterol, Total 108 100 - 199 mg/dL   Triglycerides 269 (H) 0 - 149 mg/dL   HDL 36 (L) >39 mg/dL   VLDL Cholesterol Cal 41 (H) 5 - 40 mg/dL   LDL Chol Calc (NIH) 31 0 - 99 mg/dL  CBC with Differential/Platelet  Result Value Ref Range   WBC 9.5 3.4 - 10.8 x10E3/uL   RBC 4.93 4.14 - 5.80 x10E6/uL   Hemoglobin 15.7 13.0 - 17.7 g/dL   Hematocrit 46.2 37.5 - 51.0 %   MCV 94 79 - 97 fL   MCH 31.8 26.6 - 33.0 pg   MCHC 34.0 31.5 - 35.7 g/dL   RDW 13.5 11.6 - 15.4 %   Platelets 259 150 - 450 x10E3/uL   Neutrophils 70 Not Estab. %   Lymphs 17 Not Estab. %   Monocytes 9 Not Estab. %   Eos 2 Not Estab. %   Basos 1 Not Estab. %   Neutrophils Absolute 6.8 1.4 - 7.0 x10E3/uL   Lymphocytes Absolute 1.6 0.7 - 3.1 x10E3/uL   Monocytes Absolute 0.8 0.1 - 0.9 x10E3/uL   EOS (ABSOLUTE) 0.2 0.0 - 0.4 x10E3/uL   Basophils Absolute 0.1 0.0 - 0.2 x10E3/uL   Immature Granulocytes 1 Not Estab. %   Immature Grans (Abs) 0.1 0.0 - 0.1 x10E3/uL      Assessment & Plan:   Problem List Items Addressed This Visit      Other   Transient alteration of awareness    Acute with no further episodes.  MRI obtained and showed no infarction or hemorrhage, did show periventricular and subcortical white matter changes mildly advanced for age.   Discussed with him and he wishes to further review with neurology next week.  Glucose on recent labs 33, again reiterated to him not to take his Novolin in morning unless he eats.  Concern recent event was related to possible hypoglycemia.  Will continue to work with CCM team and endocrinology on diabetes and insulin care with risk for hypoglycemia -- he reports often noticing low sugars, however concern he may not be consistently noting these episodes.  Return in March after endo and cardiology visits.  Return sooner if worsening or further episodes.          Follow up plan: Return in about 6 weeks (around 06/06/2019) for T2DM, HTN/HLD.

## 2019-04-27 ENCOUNTER — Ambulatory Visit: Payer: Self-pay | Admitting: General Practice

## 2019-04-27 NOTE — Chronic Care Management (AMB) (Signed)
  Chronic Care Management   Outreach Note  04/27/2019 Name: Justin Griffith MRN: [id number] DOB: Mar 08, 1941  Referred by: [name], NP Reason for referral : Chronic Care Management (RNCM Chronic Disease Management and Care coordination needs: Referral from [facility])   An unsuccessful telephone outreach was attempted today. The patient was referred to the case management team by for assistance with care management and care coordination.   Follow Up Plan: A HIPPA compliant phone message was left for the patient providing contact information and requesting a return call.   Noreene Larsson RN, MSN, [facility] Mobile: [phone]

## 2019-05-02 ENCOUNTER — Ambulatory Visit: Payer: Self-pay | Admitting: General Practice

## 2019-05-02 ENCOUNTER — Telehealth: Payer: Medicare Other

## 2019-05-02 NOTE — Chronic Care Management (AMB) (Signed)
°  Chronic Care Management   Outreach Note  05/02/2019 Name: Justin Griffith MRN: [id number] DOB: [birth year]  Referred by: [name], NP Reason for referral : Chronic Care Management (Follow up: 2nd attempt)   An unsuccessful telephone outreach was attempted today. The patient was referred to the case management team for assistance with care management and care coordination.   Follow Up Plan: A HIPPA compliant phone message was left for the patient providing contact information and requesting a return call.   Noreene Larsson RN, MSN, [facility] Mobile: [phone]

## 2019-05-03 DIAGNOSIS — I10 Essential (primary) hypertension: Secondary | ICD-10-CM | POA: Diagnosis not present

## 2019-05-03 DIAGNOSIS — E785 Hyperlipidemia, unspecified: Secondary | ICD-10-CM | POA: Diagnosis not present

## 2019-05-03 DIAGNOSIS — E1159 Type 2 diabetes mellitus with other circulatory complications: Secondary | ICD-10-CM | POA: Diagnosis not present

## 2019-05-03 DIAGNOSIS — E1169 Type 2 diabetes mellitus with other specified complication: Secondary | ICD-10-CM | POA: Diagnosis not present

## 2019-05-03 DIAGNOSIS — Z794 Long term (current) use of insulin: Secondary | ICD-10-CM | POA: Diagnosis not present

## 2019-05-03 DIAGNOSIS — E1142 Type 2 diabetes mellitus with diabetic polyneuropathy: Secondary | ICD-10-CM | POA: Diagnosis not present

## 2019-05-09 ENCOUNTER — Ambulatory Visit (INDEPENDENT_AMBULATORY_CARE_PROVIDER_SITE_OTHER): Payer: Medicare Other | Admitting: Pharmacist

## 2019-05-09 DIAGNOSIS — E1165 Type 2 diabetes mellitus with hyperglycemia: Secondary | ICD-10-CM | POA: Diagnosis not present

## 2019-05-09 DIAGNOSIS — Z794 Long term (current) use of insulin: Secondary | ICD-10-CM

## 2019-05-09 DIAGNOSIS — I739 Peripheral vascular disease, unspecified: Secondary | ICD-10-CM

## 2019-05-09 NOTE — Patient Instructions (Signed)
Visit Information  Goals Addressed            This Visit's Progress     Patient Stated   . PharmD "I don't know much about my diabetes" (pt-stated)       Current Barriers:  . Diabetes: uncontrolled; most recent A1c 8.5%; [facility] ([name])  o Notes concerns about "blisters" on his lower legs, as well as increased swelling in legs/feet, and now into his left hand. Notes he arrived yesterday for a nerve exam per neurology and was turned away d/t "too much swelling". He is extremely frustrated.  o Notes he scheduled an appointment w/ [name] last week to look at leg blistering, as he assumed it was related to diabetes. [name] noted that his leisons not related to DM, and he should follow up w/ PCP if no improvement.  o Noncompliant with compression hose because he doesn't like wearing tight socks . Current antihyperglycemic regimen: Lantus 80 units QAM and Novolin R 25-30 units TID with meals; metformin 1000 mg BID; visit note from last week stated to take Novolin R w/ snacks (eg cheesecake) ? Hx significant GI upset and intolerance with Victoza and Trulicity, denies ever having tried Ozempic before ? Hx allergic rash to [place] ? Notes that he was previously on Humalog and it "made my ankles swell" ? Notes that Lantus "works better" than Toujeo for him ? Previous visit [name] noted "[name] =250 which is consistent with an a1c ~5.9. A1c may not be accurate. Will review at next visit when he has new meter". Next appt 06/14/19 . Frequent documentation of hypoglycemia due to taking insulin and not eating. . Cardiovascular risk reduction; follows w/ [name] (next app 06/01/2019) o Current hypertensive regimen: amlodipine 5 mg daily (*though upon review, is still taking 10 mg daily), carvedilol 25 mg BID, clonidine 0.2 BID, HCTZ 25 mg, losartan 100 mg daily; furosemide 40 mg daily o Current  hyperlipidemia regimen: rosuvastatin 20 mg daily (taking 1/2 tablet) . Essential tremor: follows w/ [name]. Gabapentin dose recently increased from 200 mg TID to 300 mg TID.  Marland Kitchen COPD: follows w/ [name]; on Duonebs QID. Noted that he was given a course of Qvar during recent exacerbation.  Pharmacist Clinical Goal(s):  Marland Kitchen Over the next 90 days, patient will work with PharmD and primary care provider to address optimized medication management  Interventions: . Encouraged patient to bring all medications to his next appointment w/ PCP for full review. Will encourage appointment to be scheduled on Tues/Wed so that I can be present for med review . Increased swelling could be related to increase in gabapentin dose. Will continue to work collaboratively w/ PCP, and add in [place] CM, to help guide patient in Care Coordination.   Patient Self Care Activities:  . Patient will check blood glucose daily , document, and provide at future appointments . Patient will take medications as prescribed . Patient will report any questions or concerns to provider   Please see past updates related to this goal by clicking on the "Past Updates" button in the selected goal         The patient verbalized understanding of instructions provided today and declined a print copy of patient instruction materials.   Plan:  - Will collaborate w/ office staff to have patient schedule for an in person appointment on a Tues/Wed  Catie Darnelle Maffucci, PharmD, [facility] [phone]

## 2019-05-09 NOTE — Chronic Care Management (AMB) (Signed)
Chronic Care Management   Follow Up Note   05/09/2019 Name: ANEEL STEFANELLI MRN: [id number] DOB: 1940-11-30  Referred by: [name], NP Reason for referral : Chronic Care Management (Medication Management)   JAMESPAUL CEDERQUIST is a 79 y.o. year old male who is a primary care patient of [name], [name], NP. The CCM team was consulted for assistance with chronic disease management and care coordination needs.    Contacted patient for medication management review.   Review of patient status, including review of consultants reports, relevant laboratory and other test results, and collaboration with appropriate care team members and the patient's provider was performed as part of comprehensive patient evaluation and provision of chronic care management services.    SDOH (Social Determinants of Health) screening performed today: None. See Care Plan for related entries.   Outpatient Encounter Medications as of 05/09/2019  Medication Sig Note  . amLODipine (NORVASC) 5 MG tablet Take 1 tablet (5 mg total) by mouth daily. (Patient taking differently: Take 10 mg by mouth daily. )   . furosemide (LASIX) 20 MG tablet Take 2 tablets (40 mg total) by mouth daily. 05/09/2019: 1 40 mg tablet  . gabapentin (NEURONTIN) 100 MG capsule Take 300 mg by mouth 3 (three) times daily.    . rosuvastatin (CRESTOR) 40 MG tablet Take 1 tablet (40 mg total) by mouth at bedtime. (Patient taking differently: Take 40 mg by mouth at bedtime. 20 mg once a day)   . acyclovir (ZOVIRAX) 400 MG tablet Take 400 mg by mouth 2 (two) times daily.   Marland Kitchen aspirin EC 81 MG tablet Take 81 mg by mouth daily.    . carvedilol (COREG) 25 MG tablet Take 1 tablet (25 mg total) by mouth 2 (two) times daily with a meal.   . cetirizine (ZYRTEC) 10 MG tablet Take 10 mg by mouth daily.   . cloNIDine (CATAPRES) 0.2 MG tablet Take 0.2 mg by mouth 2 (two) times daily.   . hydrochlorothiazide (HYDRODIURIL) 25 MG tablet Take 25 mg by mouth daily.     . insulin glargine (LANTUS) 100 UNIT/ML injection Inject 75 Units into the skin daily.  01/11/2019: 80  . insulin regular (NOVOLIN R) 100 units/mL injection Inject 30 Units into the skin 3 (three) times daily before meals.  01/25/2019: Taking PRN when sugar >150  . ipratropium-albuterol (DUONEB) 0.5-2.5 (3) MG/3ML SOLN Take 3 mLs by nebulization every 6 (six) hours. 02/21/2019: Taking QID   . losartan (COZAAR) 100 MG tablet Take 100 mg by mouth daily.   . metFORMIN (GLUCOPHAGE) 1000 MG tablet Take 1,000 mg by mouth 2 (two) times daily with a meal.    . naproxen (NAPROSYN) 500 MG tablet Take 500 mg by mouth 2 (two) times daily with a meal. Arthritis   . QVAR REDIHALER 40 MCG/ACT inhaler    . tamsulosin (FLOMAX) 0.4 MG CAPS capsule Take 0.4 mg by mouth daily.   . Vitamin D, Cholecalciferol, 25 MCG (1000 UT) CAPS Take 25 mcg by mouth daily.    No facility-administered encounter medications on file as of 05/09/2019.     Objective:   Goals Addressed            This Visit's Progress     Patient Stated   . PharmD "I don't know much about my diabetes" (pt-stated)       Current Barriers:  . Diabetes: uncontrolled; most recent A1c 8.5%; [facility] ([name])  o  Notes concerns about "blisters" on his lower legs, as well as increased swelling in legs/feet, and now into his left hand. Notes he arrived yesterday for a nerve exam per neurology and was turned away d/t "too much swelling". He is extremely frustrated.  o Notes he scheduled an appointment w/ [name] last week to look at leg blistering, as he assumed it was related to diabetes. [name] noted that his leisons not related to DM, and he should follow up w/ PCP if no improvement.  o Noncompliant with compression hose because he doesn't like wearing tight socks . Current antihyperglycemic regimen: Lantus 80 units QAM and Novolin R 25-30 units TID with meals; metformin 1000 mg BID;  visit note from last week stated to take Novolin R w/ snacks (eg cheesecake) ? Hx significant GI upset and intolerance with Victoza and Trulicity, denies ever having tried Ozempic before ? Hx allergic rash to [place] ? Notes that he was previously on Humalog and it "made my ankles swell" ? Notes that Lantus "works better" than Toujeo for him ? Previous visit [name] noted "[name] =250 which is consistent with an a1c ~5.9. A1c may not be accurate. Will review at next visit when he has new meter". Next appt 06/14/19 . Frequent documentation of hypoglycemia due to taking insulin and not eating. . Cardiovascular risk reduction; follows w/ [name] (next app 06/01/2019) o Current hypertensive regimen: amlodipine 5 mg daily (*though upon review, is still taking 10 mg daily), carvedilol 25 mg BID, clonidine 0.2 BID, HCTZ 25 mg, losartan 100 mg daily; furosemide 40 mg daily o Current hyperlipidemia regimen: rosuvastatin 20 mg daily (taking 1/2 tablet) . Essential tremor: follows w/ [name]. Gabapentin dose recently increased from 200 mg TID to 300 mg TID.  Marland Kitchen COPD: follows w/ [name]; on Duonebs QID. Noted that he was given a course of Qvar during recent exacerbation.  Pharmacist Clinical Goal(s):  Marland Kitchen Over the next 90 days, patient will work with PharmD and primary care provider to address optimized medication management  Interventions: . Encouraged patient to bring all medications to his next appointment w/ PCP for full review. Will encourage appointment to be scheduled on Tues/Wed so that I can be present for med review . Increased swelling could be related to increase in gabapentin dose. Will continue to work collaboratively w/ PCP, and add in [place] CM, to help guide patient in Care Coordination.   Patient Self Care Activities:  . Patient will check blood glucose daily , document, and provide at future appointments . Patient will take medications as  prescribed . Patient will report any questions or concerns to provider   Please see past updates related to this goal by clicking on the "Past Updates" button in the selected goal          Plan:  - Will collaborate w/ office staff to have patient schedule for an in person appointment on a Tues/Wed  Catie Darnelle Maffucci, PharmD, [place] [phone]

## 2019-05-18 DIAGNOSIS — Z9989 Dependence on other enabling machines and devices: Secondary | ICD-10-CM | POA: Diagnosis not present

## 2019-05-18 DIAGNOSIS — G250 Essential tremor: Secondary | ICD-10-CM | POA: Diagnosis not present

## 2019-05-18 DIAGNOSIS — R262 Difficulty in walking, not elsewhere classified: Secondary | ICD-10-CM | POA: Diagnosis not present

## 2019-05-18 DIAGNOSIS — G4733 Obstructive sleep apnea (adult) (pediatric): Secondary | ICD-10-CM | POA: Diagnosis not present

## 2019-05-23 ENCOUNTER — Ambulatory Visit: Payer: Self-pay | Admitting: General Practice

## 2019-05-23 ENCOUNTER — Encounter: Payer: Self-pay | Admitting: Nurse Practitioner

## 2019-05-23 ENCOUNTER — Ambulatory Visit (INDEPENDENT_AMBULATORY_CARE_PROVIDER_SITE_OTHER): Payer: Medicare Other | Admitting: Pharmacist

## 2019-05-23 ENCOUNTER — Ambulatory Visit (INDEPENDENT_AMBULATORY_CARE_PROVIDER_SITE_OTHER): Payer: Medicare Other | Admitting: Nurse Practitioner

## 2019-05-23 ENCOUNTER — Other Ambulatory Visit: Payer: Self-pay

## 2019-05-23 VITALS — BP 138/64 | HR 58 | Temp 97.6°F

## 2019-05-23 DIAGNOSIS — E1169 Type 2 diabetes mellitus with other specified complication: Secondary | ICD-10-CM

## 2019-05-23 DIAGNOSIS — E785 Hyperlipidemia, unspecified: Secondary | ICD-10-CM | POA: Diagnosis not present

## 2019-05-23 DIAGNOSIS — I10 Essential (primary) hypertension: Secondary | ICD-10-CM

## 2019-05-23 DIAGNOSIS — Z794 Long term (current) use of insulin: Secondary | ICD-10-CM

## 2019-05-23 DIAGNOSIS — I152 Hypertension secondary to endocrine disorders: Secondary | ICD-10-CM

## 2019-05-23 DIAGNOSIS — E1165 Type 2 diabetes mellitus with hyperglycemia: Secondary | ICD-10-CM

## 2019-05-23 DIAGNOSIS — E1159 Type 2 diabetes mellitus with other circulatory complications: Secondary | ICD-10-CM

## 2019-05-23 DIAGNOSIS — I739 Peripheral vascular disease, unspecified: Secondary | ICD-10-CM

## 2019-05-23 DIAGNOSIS — J441 Chronic obstructive pulmonary disease with (acute) exacerbation: Secondary | ICD-10-CM | POA: Diagnosis not present

## 2019-05-23 DIAGNOSIS — I872 Venous insufficiency (chronic) (peripheral): Secondary | ICD-10-CM | POA: Insufficient documentation

## 2019-05-23 DIAGNOSIS — R600 Localized edema: Secondary | ICD-10-CM

## 2019-05-23 DIAGNOSIS — Z6841 Body Mass Index (BMI) 40.0 and over, adult: Secondary | ICD-10-CM

## 2019-05-23 MED ORDER — MUPIROCIN 2 % EX OINT: 1.0000 "application " | TOPICAL_OINTMENT | Freq: Two times a day (BID) | CUTANEOUS | 0 refills | Status: DC

## 2019-05-23 MED ORDER — CLOBETASOL PROPIONATE 0.05 % EX CREA: 1.0000 "application " | TOPICAL_CREAM | Freq: Two times a day (BID) | CUTANEOUS | 1 refills | Status: DC

## 2019-05-23 NOTE — Patient Instructions (Signed)
Peripheral Vascular Disease  Peripheral vascular disease (PVD) is a disease of the blood vessels that are not part of your heart and brain. A simple term for PVD is poor circulation. In most cases, PVD narrows the blood vessels that carry blood from your heart to the rest of your body. This can reduce the supply of blood to your arms, legs, and internal organs, like your stomach or kidneys. However, PVD most often affects a person's lower legs and feet. Without treatment, PVD tends to get worse. PVD can also lead to acute ischemic limb. This is when an arm or leg suddenly cannot get enough blood. This is a medical emergency. Follow these instructions at home: Lifestyle  Do not use any products that contain nicotine or tobacco, such as cigarettes and e-cigarettes. If you need help quitting, ask your doctor.  Lose weight if you are overweight. Or, stay at a healthy weight as told by your doctor.  Eat a diet that is low in fat and cholesterol. If you need help, ask your doctor.  Exercise regularly. Ask your doctor for activities that are right for you. General instructions  Take over-the-counter and prescription medicines only as told by your doctor.  Take good care of your feet: ? Wear comfortable shoes that fit well. ? Check your feet often for any cuts or sores.  Keep all follow-up visits as told by your doctor This is important. Contact a doctor if:  You have cramps in your legs when you walk.  You have leg pain when you are at rest.  You have coldness in a leg or foot.  Your skin changes.  You are unable to get or have an erection (erectile dysfunction).  You have cuts or sores on your feet that do not heal. Get help right away if:  Your arm or leg turns cold, numb, and blue.  Your arms or legs become red, warm, swollen, painful, or numb.  You have chest pain.  You have trouble breathing.  You suddenly have weakness in your face, arm, or leg.  You become very  confused or you cannot speak.  You suddenly have a very bad headache.  You suddenly cannot see. Summary  Peripheral vascular disease (PVD) is a disease of the blood vessels.  A simple term for PVD is poor circulation. Without treatment, PVD tends to get worse.  Treatment may include exercise, low fat and low cholesterol diet, and quitting smoking. This information is not intended to replace advice given to you by your health care provider. Make sure you discuss any questions you have with your health care provider. Document Revised: 02/19/2017 Document Reviewed: 04/16/2016 Elsevier Patient Education  2020 [employer]  

## 2019-05-23 NOTE — Chronic Care Management (AMB) (Signed)
Chronic Care Management   Follow Up Note   05/23/2019 Name: Justin Griffith MRN: [id number] DOB: [birth year]  Referred by: [name], NP Reason for referral : Chronic Care Management (Medication Management)   Justin Griffith is a 79 y.o. year old male who is a primary care patient of [name], [name], NP. The CCM team was consulted for assistance with chronic disease management and care coordination needs.    Met with patient face to face w/ RN CM and PCP.   Review of patient status, including review of consultants reports, relevant laboratory and other test results, and collaboration with appropriate care team members and the patient's provider was performed as part of comprehensive patient evaluation and provision of chronic care management services.    SDOH (Social Determinants of Health) assessments performed: No See Care Plan activities for detailed interventions related to [facility])     Outpatient Encounter Medications as of 05/23/2019  Medication Sig Note  . acyclovir (ZOVIRAX) 400 MG tablet Take 400 mg by mouth 2 (two) times daily.   Marland Kitchen amLODipine (NORVASC) 5 MG tablet Take 1 tablet (5 mg total) by mouth daily. (Patient taking differently: Take 10 mg by mouth daily. )   . aspirin EC 81 MG tablet Take 81 mg by mouth daily.    . carvedilol (COREG) 25 MG tablet Take 1 tablet (25 mg total) by mouth 2 (two) times daily with a meal.   . cetirizine (ZYRTEC) 10 MG tablet Take 10 mg by mouth daily.   . cloNIDine (CATAPRES) 0.2 MG tablet Take 0.2 mg by mouth 2 (two) times daily.   . furosemide (LASIX) 20 MG tablet Take 2 tablets (40 mg total) by mouth daily. 05/23/2019: Unsure if he is taking 20 mg or 40 mg daily  . hydrochlorothiazide (HYDRODIURIL) 25 MG tablet Take 25 mg by mouth daily.   . insulin glargine (LANTUS) 100 UNIT/ML injection Inject 75 Units into the skin daily.  01/11/2019: 79  . ipratropium-albuterol (DUONEB) 0.5-2.5 (3) MG/3ML SOLN Take 3 mLs by nebulization every 6 (six)  hours. 02/21/2019: Taking QID   . losartan (COZAAR) 100 MG tablet Take 100 mg by mouth daily.   . metFORMIN (GLUCOPHAGE) 1000 MG tablet Take 1,000 mg by mouth 2 (two) times daily with a meal.    . naproxen (NAPROSYN) 500 MG tablet Take 500 mg by mouth 2 (two) times daily with a meal. Arthritis 05/23/2019: PRN   . QVAR REDIHALER 40 MCG/ACT inhaler 1 puff 2 (two) times daily.  05/23/2019: Using PRN  . rosuvastatin (CRESTOR) 40 MG tablet Take 1 tablet (40 mg total) by mouth at bedtime. (Patient taking differently: Take 40 mg by mouth at bedtime. 20 mg once a day)   . tamsulosin (FLOMAX) 0.4 MG CAPS capsule Take 0.4 mg by mouth daily.   . vitamin B-12 (CYANOCOBALAMIN) 500 MCG tablet Take 500 mcg by mouth daily.   . Vitamin D, Cholecalciferol, 25 MCG (1000 UT) CAPS Take 25 mcg by mouth daily.   . insulin regular (NOVOLIN R) 100 units/mL injection Inject 30 Units into the skin 3 (three) times daily before meals.  01/25/2019: Taking PRN when sugar >150  . [DISCONTINUED] gabapentin (NEURONTIN) 100 MG capsule Take 300 mg by mouth 3 (three) times daily.     No facility-administered encounter medications on file as of 05/23/2019.     Objective:   Goals Addressed            This Visit's Progress  Patient Stated   . PharmD "I don't know much about my diabetes" (pt-stated)       CARE PLAN ENTRY (see longtitudinal plan of care for additional care plan information)  Current Barriers:  . Diabetes: uncontrolled; most recent A1c 8.5%; [facility] ([name])  o Notes concerns about "blisters" on his lower legs, as well as increased swelling in legs/feet, and now into his left hand. Is not wearing compression hose, notes that he cannot stand to wear them o Confirms that he d/c gabapentin last week per neurology . Current antihyperglycemic regimen: Metformin 1000 mg BID, Lantus 80 units QAM and Novolin R 25-30 units TID with meals - notes that he has not  taken any Novolin since he started "a diet" last week. Notes that his diet is eating once daily, though he has previously reported that he has been eating one meal daily. Notes he has tried to cut back on snacking. . Some fasting readings: 110-130s, though reports some in 200s. Notes this likely relate to what he ate the night before. Notes some post prandial 150-160s, though recall is difficult ? Hx significant GI upset and intolerance with Victoza and Trulicity, denies ever having tried Ozempic before ? Hx allergic rash to [place] ? Notes that he was previously on Humalog and it "made my ankles swell" ? Notes that Lantus "works better" than Toujeo for him ? Previous visit [name] noted "[name] =250 which is consistent with an a1c ~5.9. A1c may not be accurate. Will review at next visit when he has new meter". Next appt 06/14/19 . Denies hypoglycemia in the past 4 days w/ less use of Novolin R . Cardiovascular risk reduction; follows w/ [name] (next app 06/01/2019) o Current hypertensive regimen: amlodipine 10 mg, carvedilol 25 mg BID, clonidine 0.2 BID, HCTZ 25 mg, losartan 100 mg daily; furosemide 40 mg daily (though bottles say 20, he is unsure what he is taking); BP high initially, but improved upon recehck o Current hyperlipidemia regimen: rosuvastatin 20 mg daily (taking 1/2 tablet of 40 mg); LDL at goal <70 . Essential tremor: follows w/ [name]. Previously on gabapentin, discontinued last week by neurology.  Marland Kitchen COPD: follows w/ [name]; on Duonebs QID. Takes Qvar PRN  Pharmacist Clinical Goal(s):  Marland Kitchen Over the next 90 days, patient will work with PharmD and primary care provider to address optimized medication management  Interventions: . Comprehensive medication review, medication list updated in electronic medical record. Reviewed medication bottles with the patient.  . Reviewed edema w/ RN CM and PCP. Vascular referral recommended; patient declined for  now, noting that he would like to work on weight loss first. He has cardiology f/u w/ [name] later this month as well.  Marland Kitchen Extensive review of risk of hypoglycemia, definition of hypoglycemia, goal A1c, fasting glucose, and goal post prandial glucose.  . Reviewed importance of documentation of glucose readings, as well as when he uses Novolin R. Provided blood glucose log; explained to check fasting and 2 hour post prandial glucose readings, as well as documenting when he takes Novolin R. It would be wonderful to determine mealtime insulin is not needed, given his erratic meal schedule and dosing of Novolin R when he doesn't eat a meal, even with continued re-education to not use correctionally, but before meals. Patient repeatedly denied episodes of hypoglycemia, even though these have frequently been reported previously on lab work and SMBG. Striving towards safe medication use,  even if needing a more relaxed A1c goal, may be most appropriate target for this patient. Will continue to collaborate w/ Dr. Etta Quill office and [place] providers . Encouraged to connect w/ [place] podiatry service.   Patient Self Care Activities:  . Patient will check blood glucose daily , document, and provide at future appointments . Patient will take medications as prescribed . Patient will report any questions or concerns to provider   Please see past updates related to this goal by clicking on the "Past Updates" button in the selected goal          Plan:  - Scheduled f/u call 07/12/19  Catie Darnelle Maffucci, PharmD, [place] [phone]

## 2019-05-23 NOTE — Assessment & Plan Note (Signed)
Chronic, ongoing.  Not wearing compression hose as instructed and continue to have ongoing edema.  Recommend modest weight loss, suspect some lymphedema present too.  Continue to recommend use of some form of compression sock.  Monitor for skin breakdown and alert provider if present.  Clobetasol sent for dermatitis and Mupirocin sent to place over areas of open blisters.  Recommend frequent elevation of legs and may take Tylenol for discomfort as needed.  Offered vascular referral, patient refuses wishes to continue on weight loss focus.  Recommend he contact PCP at [facility] and request podiatry referral for nail care + diabetic shoes.

## 2019-05-23 NOTE — Patient Instructions (Addendum)
Mr. Bechen,   I want you to document your sugars AT LEAST twice daily. CHECK FASTING (before eating) and 2 HOURS AFTER A MEAL. Please write down these readings in your blood so that we can review over the phone. Please write down which days/times you take Novolin. Take this information to Dr. Sherren Mocha appointment.   Ask your [place] doctor to refer you to the [place] foot doctor. They can get you diabetic shoes.   Ask [name] about whether to take furosemide 20 mg or 40 mg daily   Visit Information  Goals Addressed            This Visit's Progress     Patient Stated   . PharmD "I don't know much about my diabetes" (pt-stated)       CARE PLAN ENTRY (see longtitudinal plan of care for additional care plan information)  Current Barriers:  . Diabetes: uncontrolled; most recent A1c 8.5%; [facility] ([name])  o Notes concerns about "blisters" on his lower legs, as well as increased swelling in legs/feet, and now into his left hand. Is not wearing compression hose, notes that he cannot stand to wear them o Confirms that he d/c gabapentin last week per neurology . Current antihyperglycemic regimen: Metformin 1000 mg BID, Lantus 80 units QAM and Novolin R 25-30 units TID with meals - notes that he has not taken any Novolin since he started "a diet" last week. Notes that his diet is eating once daily, though he has previously reported that he has been eating one meal daily. Notes he has tried to cut back on snacking. . Some fasting readings: 110-130s, though reports some in 200s. Notes this likely relate to what he ate the night before. Notes some post prandial 150-160s, though recall is difficult ? Hx significant GI upset and intolerance with Victoza and Trulicity, denies ever having tried Ozempic before ? Hx allergic rash to [place] ? Notes that he was previously on Humalog and it "made my ankles swell" ? Notes that Lantus  "works better" than Toujeo for him ? Previous visit [name] noted "[name] =250 which is consistent with an a1c ~5.9. A1c may not be accurate. Will review at next visit when he has new meter". Next appt 06/14/19 . Denies hypoglycemia in the past 4 days w/ less use of Novolin R . Cardiovascular risk reduction; follows w/ [name] (next app 06/01/2019) o Current hypertensive regimen: amlodipine 10 mg, carvedilol 25 mg BID, clonidine 0.2 BID, HCTZ 25 mg, losartan 100 mg daily; furosemide 40 mg daily (though bottles say 20, he is unsure what he is taking); BP high initially, but improved upon recehck o Current hyperlipidemia regimen: rosuvastatin 20 mg daily (taking 1/2 tablet of 40 mg); LDL at goal <70 . Essential tremor: follows w/ [name]. Previously on gabapentin, discontinued last week by neurology.  Marland Kitchen COPD: follows w/ [name]; on Duonebs QID. Takes Qvar PRN  Pharmacist Clinical Goal(s):  Marland Kitchen Over the next 90 days, patient will work with PharmD and primary care provider to address optimized medication management  Interventions: . Comprehensive medication review, medication list updated in electronic medical record. Reviewed medication bottles with the patient.  . Reviewed edema w/ RN CM and PCP. Vascular referral recommended; patient declined for now, noting that he would like to work on weight loss first. He has cardiology f/u w/ [name] later this month as well.  Marland Kitchen Extensive review of risk of  hypoglycemia, definition of hypoglycemia, goal A1c, fasting glucose, and goal post prandial glucose.  . Reviewed importance of documentation of glucose readings, as well as when he uses Novolin R. Provided blood glucose log; explained to check fasting and 2 hour post prandial glucose readings, as well as documenting when he takes Novolin R. It would be wonderful to determine mealtime insulin is not needed, given his erratic meal schedule and dosing of Novolin R when he doesn't eat a  meal, even with continued re-education to not use correctionally, but before meals. Patient repeatedly denied episodes of hypoglycemia, even though these have frequently been reported previously on lab work and SMBG. Striving towards safe medication use, even if needing a more relaxed A1c goal, may be most appropriate target for this patient. Will continue to collaborate w/ Dr. Etta Quill office and [place] providers . Encouraged to connect w/ [place] podiatry service.   Patient Self Care Activities:  . Patient will check blood glucose daily , document, and provide at future appointments . Patient will take medications as prescribed . Patient will report any questions or concerns to provider   Please see past updates related to this goal by clicking on the "Past Updates" button in the selected goal         Patient verbalizes understanding of instructions provided today.   Plan:  - Scheduled f/u call 07/12/19  Catie Darnelle Maffucci, PharmD, [place] [phone]

## 2019-05-23 NOTE — Patient Instructions (Signed)
Visit Information  Goals Addressed            This Visit's Progress   . RNCM: Pt- " I am concerned about the swelling in my legs" (pt-stated)       CARE PLAN ENTRY (see longtitudinal plan of care for additional care plan information)  Current Barriers:  . Chronic Disease Management support, education, and care coordination needs related to HTN, HLD, COPD, and DMII  Clinical Goal(s) related to HTN, HLD, COPD, and DMII:  Over the next 120 days, patient will:  . Work with the care management team to address educational, disease management, and care coordination needs  . Begin or continue self health monitoring activities as directed today Measure and record cbg (blood glucose) 3-4 times daily and Measure and record blood pressure 3 times per week . Call provider office for new or worsened signs and symptoms Blood glucose findings outside established parameters, Blood pressure findings outside established parameters, Weight outside established parameters, Oxygen saturation lower than established parameter, Shortness of breath, and New or worsened symptom related to Swelling in legs, and other changes in condition  . Call care management team with questions or concerns . Verbalize basic understanding of patient centered plan of care established today  Interventions related to HTN, HLD, COPD, and DMII:  . Evaluation of current treatment plans and patient's adherence to plan as established by provider . Assessed patient understanding of disease states-patient is ready to make positive changes to improve his health and well being.  . Assessed patient's education and care coordination needs-the patient sees several different specialist and also the [name]. The patient has several healthcare practices he does at home. Education and support given . Co-visit with Pharmacist [place] office visit-face to face . Provided disease specific education to patient-[place]/ADA diet . Collaborated with  appropriate clinical care team members regarding patient needs  Patient Self Care Activities related to HTN, HLD, COPD, and DMII:  . Patient is unable to independently self-manage chronic health conditions  Initial goal documentation        Print copy of patient instructions provided.   The care management team will reach out to the patient again over the next 60 days.   Noreene Larsson RN, MSN, [facility] Mobile: [phone]

## 2019-05-23 NOTE — Progress Notes (Signed)
BP 138/64 (BP Location: Left Arm, Patient Position: Sitting) Comment: manual  Pulse (!) 58   Temp 97.6 F (36.4 C)   SpO2 95%    Subjective:    Patient ID: Justin Griffith, male    DOB: 09/16/1940, 79 y.o.   MRN: [id number]  HPI: Justin Griffith is a 79 y.o. male  Chief Complaint  Patient presents with  . Diabetes   DIABETES Followed by [name], with next visit 06/14/2019.  Last A1C was 8.5%.  He also sees a PCP at the [name].  CCM team and PCP have been working with him to help bring pieces together, as is often confused about what medications to be taking and what not.  Currently taking Metformin 1000 MG BID and Lantus 80 units.  Has not take any Novolin in some days as he started a diet, where he is eating once daily.  Although past visits he has reported he does not eat breakfast, but eats lunch and dinner.  He does have a goal of weight loss.  Reports swelling in legs, this has been an ongoing issue/intermittent periods of worsening and noted on occasional past visit.  Recently had episode of "little tiny blisters popping up" first to left leg and then to right leg, ongoing on/off for two weeks.  States the blisters can be itchy at times and they do drain.  Was recommended to wear compression hose at past visits, but continues not to wear these reporting "they are too tight".  Recently had Gabapentin discontinued by neuro and edema continues.  Reported to [facility] Pharm[facility] he thought about stopping Lasix, as was worried it was causing edema.  Was educated not to stop this, as it is for heart and can help edema. He sees [name] with cardiology on 06/01/2019 next. Hypoglycemic episodes:denies recent, but has had several low morning readings in past Polydipsia/polyuria: no Visual disturbance: no Chest pain: no Paresthesias: no Glucose Monitoring: yes  Accucheck frequency: BID  Fasting glucose: 110-130 with an occasional 200 dependent on what he eats  Post prandial:  150-160's  Evening:  Before meals: Taking Insulin?: yes  Long acting insulin: Lantus 80 units  Short acting insulin: Blood Pressure Monitoring: not checking Retinal Examination: Not up to Date Foot Exam: Up to Date Pneumovax: Up to Date Influenza: Up to Date Aspirin: yes  Relevant past medical, surgical, family and social history reviewed and updated as indicated. Interim medical history since our last visit reviewed. Allergies and medications reviewed and updated.  Review of Systems  Constitutional: Negative for activity change, diaphoresis, fatigue and fever.  Respiratory: Positive for shortness of breath (at baseline) and wheezing (at baseline). Negative for cough and chest tightness.   Cardiovascular: Positive for leg swelling. Negative for chest pain and palpitations.  Gastrointestinal: Negative.   Endocrine: Negative for cold intolerance, heat intolerance, polydipsia, polyphagia and polyuria.  Skin: Positive for rash.  Neurological: Negative.   Psychiatric/Behavioral: Negative.     Per HPI unless specifically indicated above     Objective:    BP 138/64 (BP Location: Left Arm, Patient Position: Sitting) Comment: manual  Pulse (!) 58   Temp 97.6 F (36.4 C)   SpO2 95%   Wt Readings from Last 3 Encounters:  04/21/19 (!) 330 lb (149.7 kg)  01/25/19 (!) 304 lb (137.9 kg)  01/11/19 (!) 308 lb (139.7 kg)    Physical Exam Vitals and nursing note reviewed.  Constitutional:      General: He is awake. He  is not in acute distress.    Appearance: He is well-developed. He is morbidly obese. He is not ill-appearing.  HENT:     Head: Normocephalic and atraumatic.     Right Ear: Hearing normal. No drainage.     Left Ear: Hearing normal. No drainage.  Eyes:     General: Lids are normal.        Right eye: No discharge.        Left eye: No discharge.     Conjunctiva/sclera: Conjunctivae normal.     Pupils: Pupils are equal, round, and reactive to light.  Neck:      Vascular: No carotid bruit.  Cardiovascular:     Rate and Rhythm: Normal rate and regular rhythm.     Pulses:          Dorsalis pedis pulses are 1+ on the right side and 1+ on the left side.       Posterior tibial pulses are 1+ on the right side and 1+ on the left side.     Heart sounds: Normal heart sounds, S1 normal and S2 normal. No murmur. No gallop.   Pulmonary:     Effort: Pulmonary effort is normal. No accessory muscle usage or respiratory distress.     Breath sounds: Normal breath sounds.  Abdominal:     General: Bowel sounds are normal.     Palpations: Abdomen is soft. There is no splenomegaly.  Musculoskeletal:        General: Normal range of motion.     Cervical back: Normal range of motion and neck supple.     Right lower leg: 2+ Pitting Edema present.     Left lower leg: 2+ Pitting Edema present.  Feet:     Right foot:     Skin integrity: Dry skin present.     Toenail Condition: Right toenails are abnormally thick. Fungal disease present.    Left foot:     Skin integrity: Dry skin present.     Toenail Condition: Left toenails are abnormally thick. Fungal disease present. Skin:    General: Skin is warm and dry.     Capillary Refill: Capillary refill takes less than 2 seconds.     Comments: Bilateral lower extremities with hemosiderin staining anterior.  Scattered areas of small clear fluid filled intact blisters and some larger blisters no longer intact with clear drainage.  Blister present L>R lower extremity and present to anterior RLE and posterior and anterior LLE. No erythema, warmth, or tenderness to areas.  Moderate xerosis BLE, scaling and white.  Neurological:     Mental Status: He is alert and oriented to person, place, and time.  Psychiatric:        Attention and Perception: Attention normal.        Mood and Affect: Mood normal.        Speech: Speech normal.        Behavior: Behavior normal. Behavior is cooperative.     Results for orders placed or  performed in visit on 04/21/19  Comprehensive metabolic panel  Result Value Ref Range   Glucose 33 (LL) 65 - 99 mg/dL   BUN 19 8 - 27 mg/dL   Creatinine, Ser 1.16 0.76 - 1.27 mg/dL   GFR calc non Af Amer 60 >59 mL/min/1.73   GFR calc Af Amer 69 >59 mL/min/1.73   BUN/Creatinine Ratio 16 10 - 24   Sodium 142 134 - 144 mmol/L   Potassium 4.3 3.5 - 5.2 mmol/L  Chloride 100 96 - 106 mmol/L   CO2 27 20 - 29 mmol/L   Calcium 10.6 (H) 8.6 - 10.2 mg/dL   Total Protein 6.5 6.0 - 8.5 g/dL   Albumin 4.0 3.7 - 4.7 g/dL   Globulin, Total 2.5 1.5 - 4.5 g/dL   Albumin/Globulin Ratio 1.6 1.2 - 2.2   Bilirubin Total 0.4 0.0 - 1.2 mg/dL   Alkaline Phosphatase 48 39 - 117 IU/L   AST 11 0 - 40 IU/L   ALT 16 0 - 44 IU/L  Vitamin B12  Result Value Ref Range   Vitamin B-12 194 (L) 232 - 1,245 pg/mL  Lipid Panel w/o Chol/HDL Ratio  Result Value Ref Range   Cholesterol, Total 108 100 - 199 mg/dL   Triglycerides 269 (H) 0 - 149 mg/dL   HDL 36 (L) >39 mg/dL   VLDL Cholesterol Cal 41 (H) 5 - 40 mg/dL   LDL Chol Calc (NIH) 31 0 - 99 mg/dL  CBC with Differential/Platelet  Result Value Ref Range   WBC 9.5 3.4 - 10.8 x10E3/uL   RBC 4.93 4.14 - 5.80 x10E6/uL   Hemoglobin 15.7 13.0 - 17.7 g/dL   Hematocrit 46.2 37.5 - 51.0 %   MCV 94 79 - 97 fL   MCH 31.8 26.6 - 33.0 pg   MCHC 34.0 31.5 - 35.7 g/dL   RDW 13.5 11.6 - 15.4 %   Platelets 259 150 - 450 x10E3/uL   Neutrophils 70 Not Estab. %   Lymphs 17 Not Estab. %   Monocytes 9 Not Estab. %   Eos 2 Not Estab. %   Basos 1 Not Estab. %   Neutrophils Absolute 6.8 1.4 - 7.0 x10E3/uL   Lymphocytes Absolute 1.6 0.7 - 3.1 x10E3/uL   Monocytes Absolute 0.8 0.1 - 0.9 x10E3/uL   EOS (ABSOLUTE) 0.2 0.0 - 0.4 x10E3/uL   Basophils Absolute 0.1 0.0 - 0.2 x10E3/uL   Immature Granulocytes 1 Not Estab. %   Immature Grans (Abs) 0.1 0.0 - 0.1 x10E3/uL      Assessment & Plan:   Problem List Items Addressed This Visit      Cardiovascular and Mediastinum    Peripheral vascular disease with stasis dermatitis    Chronic, ongoing.  Not wearing compression hose as instructed and continue to have ongoing edema.  Recommend modest weight loss, suspect some lymphedema present too.  Continue to recommend use of some form of compression sock.  Monitor for skin breakdown and alert provider if present.  Clobetasol sent for dermatitis and Mupirocin sent to place over areas of open blisters.  Recommend frequent elevation of legs and may take Tylenol for discomfort as needed.  Offered vascular referral, patient refuses wishes to continue on weight loss focus.  Recommend he contact PCP at [facility] and request podiatry referral for nail care + diabetic shoes.          Endocrine   Type 2 diabetes mellitus with hyperglycemia (HCC) - Primary    Chronic, ongoing with poor control.  Followed by endocrinology.  Continue current collaboration and medication regimen.  Recommend focus on modest weight loss and diet changes.  Review upcoming endocrinology note once available.  Continue collaboration with CCM team to assist in ongoing education about diabetes.          Follow up plan: Return in about 3 months (around 08/23/2019) for T2DM, HTN/HLD.

## 2019-05-23 NOTE — Chronic Care Management (AMB) (Signed)
Chronic Care Management   Follow Up Note   05/23/2019 Name: Justin Griffith MRN: [id number] DOB: 11-May-1940  Referred by: [name], NP Reason for referral : Chronic Care Management (Co-visit with CCM Pharmacist fir DM2/HTN/COPD/PAD)   Justin Griffith is a 79 y.o. year old male who is a primary care patient of [name], [name], NP. The CCM team was consulted for assistance with chronic disease management and care coordination needs.    Review of patient status, including review of consultants reports, relevant laboratory and other test results, and collaboration with appropriate care team members and the patient's provider was performed as part of comprehensive patient evaluation and provision of chronic care management services.    SDOH (Social Determinants of Health) assessments performed: Yes See Care Plan activities for detailed interventions related to SDOH)  SDOH Interventions     Most Recent Value  SDOH Interventions  SDOH Interventions for the Following Domains  Physical Activity  Physical Activity Interventions  Other (Comments) [Education and support. The patient is going to get an exercise bike to help with activity]       Outpatient Encounter Medications as of 05/23/2019  Medication Sig Note   acyclovir (ZOVIRAX) 400 MG tablet Take 400 mg by mouth 2 (two) times daily.    amLODipine (NORVASC) 5 MG tablet Take 1 tablet (5 mg total) by mouth daily. (Patient taking differently: Take 10 mg by mouth daily. )    aspirin EC 81 MG tablet Take 81 mg by mouth daily.     carvedilol (COREG) 25 MG tablet Take 1 tablet (25 mg total) by mouth 2 (two) times daily with a meal.    cetirizine (ZYRTEC) 10 MG tablet Take 10 mg by mouth daily.    cloNIDine (CATAPRES) 0.2 MG tablet Take 0.2 mg by mouth 2 (two) times daily.    furosemide (LASIX) 20 MG tablet Take 2 tablets (40 mg total) by mouth daily. 05/23/2019: Unsure if he is taking 20 mg or 40 mg daily   hydrochlorothiazide  (HYDRODIURIL) 25 MG tablet Take 25 mg by mouth daily.    insulin glargine (LANTUS) 100 UNIT/ML injection Inject 75 Units into the skin daily.  01/11/2019: 79   insulin regular (NOVOLIN R) 100 units/mL injection Inject 30 Units into the skin 3 (three) times daily before meals.  01/25/2019: Taking PRN when sugar >150   ipratropium-albuterol (DUONEB) 0.5-2.5 (3) MG/3ML SOLN Take 3 mLs by nebulization every 6 (six) hours. 02/21/2019: Taking QID    losartan (COZAAR) 100 MG tablet Take 100 mg by mouth daily.    metFORMIN (GLUCOPHAGE) 1000 MG tablet Take 1,000 mg by mouth 2 (two) times daily with a meal.     mupirocin ointment (BACTROBAN) 2 % Apply 1 application topically 2 (two) times daily. To open blisters bilateral legs.    naproxen (NAPROSYN) 500 MG tablet Take 500 mg by mouth 2 (two) times daily with a meal. Arthritis 05/23/2019: PRN    QVAR REDIHALER 40 MCG/ACT inhaler 1 puff 2 (two) times daily.  05/23/2019: Using PRN   rosuvastatin (CRESTOR) 40 MG tablet Take 1 tablet (40 mg total) by mouth at bedtime. (Patient taking differently: Take 40 mg by mouth at bedtime. 20 mg once a day)    tamsulosin (FLOMAX) 0.4 MG CAPS capsule Take 0.4 mg by mouth daily.    vitamin B-12 (CYANOCOBALAMIN) 500 MCG tablet Take 500 mcg by mouth daily.    Vitamin D, Cholecalciferol, 25 MCG (1000 UT) CAPS Take 25 mcg by  mouth daily.    No facility-administered encounter medications on file as of 05/23/2019.     Objective:  BP Readings from Last 3 Encounters:  05/23/19 (!) 171/88  04/25/19 140/83  04/21/19 (!) 145/81   Lab Results  Component Value Date   HGBA1C 7.7 01/11/2018    Goals Addressed            This Visit's Progress    RNCM: Pt- " I am concerned about the swelling in my legs" (pt-stated)       CARE PLAN ENTRY (see longtitudinal plan of care for additional care plan information)  Current Barriers:   Chronic Disease Management support, education, and care coordination needs related to HTN,  HLD, COPD, and DMII  Clinical Goal(s) related to HTN, HLD, COPD, and DMII:  Over the next 120 days, patient will:   Work with the care management team to address educational, disease management, and care coordination needs   Begin or continue self health monitoring activities as directed today Measure and record cbg (blood glucose) 3-4 times daily and Measure and record blood pressure 3 times per week  Call provider office for new or worsened signs and symptoms Blood glucose findings outside established parameters, Blood pressure findings outside established parameters, Weight outside established parameters, Oxygen saturation lower than established parameter, Shortness of breath, and New or worsened symptom related to Swelling in legs, and other changes in condition   Call care management team with questions or concerns  Verbalize basic understanding of patient centered plan of care established today  Interventions related to HTN, HLD, COPD, and DMII:   Evaluation of current treatment plans and patient's adherence to plan as established by provider  Assessed patient understanding of disease states-patient is ready to make positive changes to improve his health and well being.   Assessed patient's education and care coordination needs-the patient sees several different specialist and also the [name]. The patient has several healthcare practices he does at home. Education and support given  Co-visit with Pharmacist [place] office visit-face to face  Provided disease specific education to patient-[employer] [place]/ADA diet  Collaborated with appropriate clinical care team members regarding patient needs  Patient Self Care Activities related to HTN, HLD, COPD, and DMII:   Patient is unable to independently self-manage chronic health conditions  Initial goal documentation         Plan:   The care management team will reach out to the patient again over the next 60 days.    Noreene Larsson RN,  MSN, [facility] Mobile: [phone]

## 2019-05-23 NOTE — Assessment & Plan Note (Signed)
Chronic, ongoing with poor control.  Followed by endocrinology.  Continue current collaboration and medication regimen.  Recommend focus on modest weight loss and diet changes.  Review upcoming endocrinology note once available.  Continue collaboration with CCM team to assist in ongoing education about diabetes.

## 2019-06-01 DIAGNOSIS — I10 Essential (primary) hypertension: Secondary | ICD-10-CM | POA: Diagnosis not present

## 2019-06-01 DIAGNOSIS — Z794 Long term (current) use of insulin: Secondary | ICD-10-CM | POA: Diagnosis not present

## 2019-06-01 DIAGNOSIS — I209 Angina pectoris, unspecified: Secondary | ICD-10-CM | POA: Diagnosis not present

## 2019-06-01 DIAGNOSIS — R600 Localized edema: Secondary | ICD-10-CM | POA: Diagnosis not present

## 2019-06-01 DIAGNOSIS — G4733 Obstructive sleep apnea (adult) (pediatric): Secondary | ICD-10-CM | POA: Diagnosis not present

## 2019-06-01 DIAGNOSIS — E782 Mixed hyperlipidemia: Secondary | ICD-10-CM | POA: Diagnosis not present

## 2019-06-01 DIAGNOSIS — R0602 Shortness of breath: Secondary | ICD-10-CM | POA: Diagnosis not present

## 2019-06-01 DIAGNOSIS — I2510 Atherosclerotic heart disease of native coronary artery without angina pectoris: Secondary | ICD-10-CM | POA: Diagnosis not present

## 2019-06-01 DIAGNOSIS — I739 Peripheral vascular disease, unspecified: Secondary | ICD-10-CM | POA: Diagnosis not present

## 2019-06-01 DIAGNOSIS — E1142 Type 2 diabetes mellitus with diabetic polyneuropathy: Secondary | ICD-10-CM | POA: Diagnosis not present

## 2019-06-01 DIAGNOSIS — J449 Chronic obstructive pulmonary disease, unspecified: Secondary | ICD-10-CM | POA: Diagnosis not present

## 2019-06-06 ENCOUNTER — Ambulatory Visit: Payer: Medicare Other | Admitting: Nurse Practitioner

## 2019-06-14 DIAGNOSIS — I10 Essential (primary) hypertension: Secondary | ICD-10-CM | POA: Diagnosis not present

## 2019-06-14 DIAGNOSIS — E1169 Type 2 diabetes mellitus with other specified complication: Secondary | ICD-10-CM | POA: Diagnosis not present

## 2019-06-14 DIAGNOSIS — E785 Hyperlipidemia, unspecified: Secondary | ICD-10-CM | POA: Diagnosis not present

## 2019-06-14 DIAGNOSIS — E1142 Type 2 diabetes mellitus with diabetic polyneuropathy: Secondary | ICD-10-CM | POA: Diagnosis not present

## 2019-06-14 DIAGNOSIS — E1159 Type 2 diabetes mellitus with other circulatory complications: Secondary | ICD-10-CM | POA: Diagnosis not present

## 2019-06-14 DIAGNOSIS — Z794 Long term (current) use of insulin: Secondary | ICD-10-CM | POA: Diagnosis not present

## 2019-06-23 ENCOUNTER — Telehealth: Payer: Medicare Other

## 2019-07-12 ENCOUNTER — Telehealth: Payer: Medicare Other

## 2019-07-12 ENCOUNTER — Ambulatory Visit: Payer: Self-pay | Admitting: General Practice

## 2019-07-12 NOTE — Chronic Care Management (AMB) (Signed)
°  Chronic Care Management   Outreach Note  07/12/2019 Name: Justin Griffith MRN: [id number] DOB: 01-30-1941  Referred by: [name], NP Reason for referral : Chronic Care Management (Follow up: RNCM Chronic Disease Management and Care Coordination Needs: DM2/HTN/COPD- changes in Weight- new concerns      )   An unsuccessful telephone outreach was attempted today. The patient was referred to the case management team for assistance with care management and care coordination.   Follow Up Plan: The patient will call RNCM or pharmacist* as advised to Information provided to the pateints wife who wrote down [name] name and number.   Noreene Larsson RN, MSN, [facility] Mobile: [phone]

## 2019-07-14 ENCOUNTER — Ambulatory Visit (INDEPENDENT_AMBULATORY_CARE_PROVIDER_SITE_OTHER): Payer: Medicare Other | Admitting: Nurse Practitioner

## 2019-07-14 ENCOUNTER — Other Ambulatory Visit: Payer: Self-pay

## 2019-07-14 ENCOUNTER — Encounter: Payer: Self-pay | Admitting: Nurse Practitioner

## 2019-07-14 DIAGNOSIS — M79605 Pain in left leg: Secondary | ICD-10-CM | POA: Insufficient documentation

## 2019-07-14 DIAGNOSIS — M79604 Pain in right leg: Secondary | ICD-10-CM

## 2019-07-14 MED ORDER — GABAPENTIN 300 MG PO CAPS: ORAL_CAPSULE | ORAL | 3 refills | Status: DC

## 2019-07-14 NOTE — Progress Notes (Signed)
BP (!) 141/78 (BP Location: Left Arm, Cuff Size: Normal)   Pulse (!) 58   Temp (!) 97.4 F (36.3 C) (Oral)   Wt (!) 306 lb 12.8 oz (139.2 kg)   SpO2 93%   BMI 42.79 kg/m    Subjective:    Patient ID: Justin Griffith, male    DOB: 1940/03/30, 79 y.o.   MRN: [id number]  HPI: Justin Griffith is a 79 y.o. male  Chief Complaint  Patient presents with  . Leg Pain    pt states he wants to discuss bilateral leg pain he has been having    LEG DISCOMFORT Reports bilateral burning leg pain more at night for several months.  He has lost weight from 345 lbs to 306 lbs.  He had wanted to try weight loss for his ongoing leg discomfort and PVD.  Does have T2DM -- his BS has decreased in morning from 150-160 to 120-130 and in evening 140-180 to 110-130.  Recent A1C with [name] was 8.1% on 06/14/19, which is improved.    [facility] not assist with getting compression hose and not refer to podiatry -- as "not eligible for that".  [facility] is 50% service [facility] with [facility].   Duration: months Pain: yes Severity: 10/10  Quality:  dull, burning and sore Location:  lower legs Bilateral:  yes Onset: gradual Frequency: intermittent Time of  day:   at random Sudden unintentional leg jerking:   no Paresthesias:   burning pain Decreased sensation:  no Weakness:   no Insomnia:   no Fatigue:   no Alleviating factors:  Aggravating factors: Status: fluctuating Treatments attempted:  Relevant past medical, surgical, family and social history reviewed and updated as indicated. Interim medical history since our last visit reviewed. Allergies and medications reviewed and updated.  Review of Systems  Constitutional: Negative for activity change, diaphoresis, fatigue and fever.  Respiratory: Negative for cough, chest tightness, shortness of breath and wheezing.   Cardiovascular: Negative for chest pain, palpitations and leg swelling.  Musculoskeletal: Positive for arthralgias.    Neurological: Negative.   Psychiatric/Behavioral: Negative.     Per HPI unless specifically indicated above     Objective:    BP (!) 141/78 (BP Location: Left Arm, Cuff Size: Normal)   Pulse (!) 58   Temp (!) 97.4 F (36.3 C) (Oral)   Wt (!) 306 lb 12.8 oz (139.2 kg)   SpO2 93%   BMI 42.79 kg/m   Wt Readings from Last 3 Encounters:  07/14/19 (!) 306 lb 12.8 oz (139.2 kg)  04/21/19 (!) 330 lb (149.7 kg)  01/25/19 (!) 304 lb (137.9 kg)    Physical Exam Vitals and nursing note reviewed.  Constitutional:      General: He is awake. He is not in acute distress.    Appearance: He is well-developed. He is morbidly obese. He is not ill-appearing.  HENT:     Head: Normocephalic and atraumatic.     Right Ear: Hearing normal. No drainage.     Left Ear: Hearing normal. No drainage.  Eyes:     General: Lids are normal.        Right eye: No discharge.        Left eye: No discharge.     Conjunctiva/sclera: Conjunctivae normal.     Pupils: Pupils are equal, round, and reactive to light.  Neck:     Vascular: No carotid bruit.  Cardiovascular:     Rate and Rhythm:  Normal rate and regular rhythm.     Pulses:          Dorsalis pedis pulses are 1+ on the right side and 1+ on the left side.       Posterior tibial pulses are 1+ on the right side and 1+ on the left side.     Heart sounds: Normal heart sounds, S1 normal and S2 normal. No murmur. No gallop.   Pulmonary:     Effort: Pulmonary effort is normal. No accessory muscle usage or respiratory distress.     Breath sounds: Normal breath sounds.  Abdominal:     General: Bowel sounds are normal.     Palpations: Abdomen is soft.  Musculoskeletal:        General: Normal range of motion.     Cervical back: Normal range of motion and neck supple.     Right lower leg: 2+ Edema present.     Left lower leg: 2+ Edema present.     Right foot: Normal range of motion.     Left foot: Normal range of motion.  Feet:     Right foot:      Protective Sensation: 10 sites tested. 8 sites sensed.     Skin integrity: Dry skin present.     Toenail Condition: Right toenails are abnormally thick.     Left foot:     Protective Sensation: 10 sites tested. 8 sites sensed.     Skin integrity: Dry skin present.     Toenail Condition: Left toenails are abnormally thick.  Skin:    General: Skin is warm and dry.     Capillary Refill: Capillary refill takes less than 2 seconds.     Findings: No rash.  Neurological:     Mental Status: He is alert and oriented to person, place, and time.     Deep Tendon Reflexes: Reflexes are normal and symmetric.     Reflex Scores:      Brachioradialis reflexes are 2+ on the right side and 2+ on the left side.      Patellar reflexes are 2+ on the right side and 2+ on the left side. Psychiatric:        Attention and Perception: Attention normal.        Mood and Affect: Mood normal.        Speech: Speech normal.        Behavior: Behavior normal. Behavior is cooperative.        Thought Content: Thought content normal.     Results for orders placed or performed in visit on 04/21/19  Comprehensive metabolic panel  Result Value Ref Range   Glucose 33 (LL) 65 - 99 mg/dL   BUN 19 8 - 27 mg/dL   Creatinine, Ser 1.16 0.76 - 1.27 mg/dL   GFR calc non Af Amer 60 >59 mL/min/1.73   GFR calc Af Amer 69 >59 mL/min/1.73   BUN/Creatinine Ratio 16 10 - 24   Sodium 142 134 - 144 mmol/L   Potassium 4.3 3.5 - 5.2 mmol/L   Chloride 100 96 - 106 mmol/L   CO2 27 20 - 29 mmol/L   Calcium 10.6 (H) 8.6 - 10.2 mg/dL   Total Protein 6.5 6.0 - 8.5 g/dL   Albumin 4.0 3.7 - 4.7 g/dL   Globulin, Total 2.5 1.5 - 4.5 g/dL   Albumin/Globulin Ratio 1.6 1.2 - 2.2   Bilirubin Total 0.4 0.0 - 1.2 mg/dL   Alkaline Phosphatase 48 39 - 117 IU/L  AST 11 0 - 40 IU/L   ALT 16 0 - 44 IU/L  Vitamin B12  Result Value Ref Range   Vitamin B-12 194 (L) 232 - 1,245 pg/mL  Lipid Panel w/o Chol/HDL Ratio  Result Value Ref Range    Cholesterol, Total 108 100 - 199 mg/dL   Triglycerides 269 (H) 0 - 149 mg/dL   HDL 36 (L) >39 mg/dL   VLDL Cholesterol Cal 41 (H) 5 - 40 mg/dL   LDL Chol Calc (NIH) 31 0 - 99 mg/dL  CBC with Differential/Platelet  Result Value Ref Range   WBC 9.5 3.4 - 10.8 x10E3/uL   RBC 4.93 4.14 - 5.80 x10E6/uL   Hemoglobin 15.7 13.0 - 17.7 g/dL   Hematocrit 46.2 37.5 - 51.0 %   MCV 94 79 - 97 fL   MCH 31.8 26.6 - 33.0 pg   MCHC 34.0 31.5 - 35.7 g/dL   RDW 13.5 11.6 - 15.4 %   Platelets 259 150 - 450 x10E3/uL   Neutrophils 70 Not Estab. %   Lymphs 17 Not Estab. %   Monocytes 9 Not Estab. %   Eos 2 Not Estab. %   Basos 1 Not Estab. %   Neutrophils Absolute 6.8 1.4 - 7.0 x10E3/uL   Lymphocytes Absolute 1.6 0.7 - 3.1 x10E3/uL   Monocytes Absolute 0.8 0.1 - 0.9 x10E3/uL   EOS (ABSOLUTE) 0.2 0.0 - 0.4 x10E3/uL   Basophils Absolute 0.1 0.0 - 0.2 x10E3/uL   Immature Granulocytes 1 Not Estab. %   Immature Grans (Abs) 0.1 0.0 - 0.1 x10E3/uL      Assessment & Plan:   Problem List Items Addressed This Visit      Other   Morbid obesity ([place]) - Primary    Recommended eating smaller high protein, low fat meals more frequently and exercising 30 mins a day 5 times a week with a goal of 10-15lb weight loss in the next 3 months. Patient voiced their understanding and motivation to adhere to these recommendations.  Praised for recent weight loss!!       Pain in both lower extremities    Ongoing, neuropathy with T2DM vs RLS.  Will trial Gabapentin at bedtime.  Current CrCl based on labs 101.  Script sent for Gabapentin 300 MG QHS, recommend to him if tolerating and minimal improvement can increase to 600 MG (2 tablets) in one week.  Educated on neuropathy and RLS pain.  Will work with CCM team on seeing if we can get compression hose that are comfortable for him and fit.  Praised for weight loss and recommend continued focus on this.  Return as scheduled in June for follow-up or sooner if worsening.       Relevant Orders   Magnesium   Basic metabolic panel       Follow up plan: Return for as scheduled on June 5th.

## 2019-07-14 NOTE — Assessment & Plan Note (Signed)
Ongoing, neuropathy with T2DM vs RLS.  Will trial Gabapentin at bedtime.  Current CrCl based on labs 101.  Script sent for Gabapentin 300 MG QHS, recommend to him if tolerating and minimal improvement can increase to 600 MG (2 tablets) in one week.  Educated on neuropathy and RLS pain.  Will work with CCM team on seeing if we can get compression hose that are comfortable for him and fit.  Praised for weight loss and recommend continued focus on this.  Return as scheduled in June for follow-up or sooner if worsening.

## 2019-07-14 NOTE — Assessment & Plan Note (Signed)
Recommended eating smaller high protein, low fat meals more frequently and exercising 30 mins a day 5 times a week with a goal of 10-15lb weight loss in the next 3 months. Patient voiced their understanding and motivation to adhere to these recommendations.  Praised for recent weight loss!!

## 2019-07-14 NOTE — Patient Instructions (Signed)
Neuropathic Pain Neuropathic pain is pain caused by damage to the nerves that are responsible for certain sensations in your body (sensory nerves). The pain can be caused by:  Damage to the sensory nerves that send signals to your spinal cord and brain (peripheral nervous system).  Damage to the sensory nerves in your brain or spinal cord (central nervous system). Neuropathic pain can make you more sensitive to pain. Even a minor sensation can feel very painful. This is usually a long-term condition that can be difficult to treat. The type of pain differs from person to person. It may:  Start suddenly (acute), or it may develop slowly and last for a long time (chronic).  Come and go as damaged nerves heal, or it may stay at the same level for years.  Cause emotional distress, loss of sleep, and a lower quality of life. What are the causes? The most common cause of this condition is diabetes. Many other diseases and conditions can also cause neuropathic pain. Causes of neuropathic pain can be classified as:  Toxic. This is caused by medicines and chemicals. The most common cause of toxic neuropathic pain is damage from cancer treatments (chemotherapy).  Metabolic. This can be caused by: ? Diabetes. This is the most common disease that damages the nerves. ? Lack of vitamin B from long-term alcohol abuse.  Traumatic. Any injury that cuts, crushes, or stretches a nerve can cause damage and pain. A common example is feeling pain after losing an arm or leg (phantom limb pain).  Compression-related. If a sensory nerve gets trapped or compressed for a long period of time, the blood supply to the nerve can be cut off.  Vascular. Many blood vessel diseases can cause neuropathic pain by decreasing blood supply and oxygen to nerves.  Autoimmune. This type of pain results from diseases in which the body's defense system (immune system) mistakenly attacks sensory nerves. Examples of autoimmune diseases  that can cause neuropathic pain include lupus and multiple sclerosis.  Infectious. Many types of viral infections can damage sensory nerves and cause pain. Shingles infection is a common cause of this type of pain.  Inherited. Neuropathic pain can be a symptom of many diseases that are passed down through families (genetic). What increases the risk? You are more likely to develop this condition if:  You have diabetes.  You smoke.  You drink too much alcohol.  You are taking certain medicines, including medicines that kill cancer cells (chemotherapy) or that treat immune system disorders. What are the signs or symptoms? The main symptom is pain. Neuropathic pain is often described as:  Burning.  Shock-like.  Stinging.  Hot or cold.  Itching. How is this diagnosed? No single test can diagnose neuropathic pain. It is diagnosed based on:  Physical exam and your symptoms. Your health care provider will ask you about your pain. You may be asked to use a pain scale to describe how bad your pain is.  Tests. These may be done to see if you have a high sensitivity to pain and to help find the cause and location of any sensory nerve damage. They include: ? Nerve conduction studies to test how well nerve signals travel through your sensory nerves (electrodiagnostic testing). ? Stimulating your sensory nerves through electrodes on your skin and measuring the response in your spinal cord and brain (somatosensory evoked potential).  Imaging studies, such as: ? X-rays. ? CT scan. ? MRI. How is this treated? Treatment for neuropathic pain may change   over time. You may need to try different treatment options or a combination of treatments. Some options include:  Treating the underlying cause of the neuropathy, such as diabetes, kidney disease, or vitamin deficiencies.  Stopping medicines that can cause neuropathy, such as chemotherapy.  Medicine to relieve pain. Medicines may  include: ? Prescription or over-the-counter pain medicine. ? Anti-seizure medicine. ? Antidepressant medicines. ? Pain-relieving patches that are applied to painful areas of skin. ? A medicine to numb the area (local anesthetic), which can be injected as a nerve block.  Transcutaneous nerve stimulation. This uses electrical currents to block painful nerve signals. The treatment is painless.  Alternative treatments, such as: ? Acupuncture. ? Meditation. ? Massage. ? Physical therapy. ? Pain management programs. ? Counseling. Follow these instructions at home: Medicines   Take over-the-counter and prescription medicines only as told by your health care provider.  Do not drive or use heavy machinery while taking prescription pain medicine.  If you are taking prescription pain medicine, take actions to prevent or treat constipation. Your health care provider may recommend that you: ? Drink enough fluid to keep your urine pale yellow. ? Eat foods that are high in fiber, such as fresh fruits and vegetables, whole grains, and beans. ? Limit foods that are high in fat and processed sugars, such as fried or sweet foods. ? Take an over-the-counter or prescription medicine for constipation. Lifestyle   Have a good support system at home.  Consider joining a chronic pain support group.  Do not use any products that contain nicotine or tobacco, such as cigarettes and e-cigarettes. If you need help quitting, ask your health care provider.  Do not drink alcohol. General instructions  Learn as much as you can about your condition.  Work closely with all your health care providers to find the treatment plan that works best for you.  Ask your health care provider what activities are safe for you.  Keep all follow-up visits as told by your health care provider. This is important. Contact a health care provider if:  Your pain treatments are not working.  You are having side effects  from your medicines.  You are struggling with tiredness (fatigue), mood changes, depression, or anxiety. Summary  Neuropathic pain is pain caused by damage to the nerves that are responsible for certain sensations in your body (sensory nerves).  Neuropathic pain may come and go as damaged nerves heal, or it may stay at the same level for years.  Neuropathic pain is usually a long-term condition that can be difficult to treat. Consider joining a chronic pain support group. This information is not intended to replace advice given to you by your health care provider. Make sure you discuss any questions you have with your health care provider. Document Revised: 06/30/2018 Document Reviewed: 03/26/2017 Elsevier Patient Education  2020 [employer]  

## 2019-07-15 LAB — BASIC METABOLIC PANEL
BUN/Creatinine Ratio: 9 — ABNORMAL LOW (ref 10–24)
BUN: 9 mg/dL (ref 8–27)
CO2: 25 mmol/L (ref 20–29)
Calcium: 10.5 mg/dL — ABNORMAL HIGH (ref 8.6–10.2)
Chloride: 102 mmol/L (ref 96–106)
Creatinine, Ser: 0.97 mg/dL (ref 0.76–1.27)
GFR calc Af Amer: 85 mL/min/{1.73_m2} (ref >59)
GFR calc non Af Amer: 74 mL/min/{1.73_m2} (ref >59)
Glucose: 128 mg/dL — ABNORMAL HIGH (ref 65–99)
Potassium: 4.9 mmol/L (ref 3.5–5.2)
Sodium: 140 mmol/L (ref 134–144)

## 2019-07-15 LAB — MAGNESIUM: Magnesium: 1.7 mg/dL (ref 1.6–2.3)

## 2019-07-16 NOTE — Progress Notes (Signed)
Please let Mr. Traverse know his kidney function returned within normal range, as did electrolytes with exception of calcium which is only mildly elevated.  We will continue to monitor this.  Magnesium level is normal.  If any questions let me know.  Have a great day!!

## 2019-07-18 ENCOUNTER — Ambulatory Visit (INDEPENDENT_AMBULATORY_CARE_PROVIDER_SITE_OTHER): Payer: Medicare Other | Admitting: Pharmacist

## 2019-07-18 DIAGNOSIS — I152 Hypertension secondary to endocrine disorders: Secondary | ICD-10-CM

## 2019-07-18 DIAGNOSIS — E1159 Type 2 diabetes mellitus with other circulatory complications: Secondary | ICD-10-CM

## 2019-07-18 DIAGNOSIS — E1165 Type 2 diabetes mellitus with hyperglycemia: Secondary | ICD-10-CM

## 2019-07-18 NOTE — Patient Instructions (Signed)
Visit Information  Goals Addressed            This Visit's Progress     Patient Stated   . PharmD "I don't know much about my diabetes" (pt-stated)       CARE PLAN ENTRY (see longtitudinal plan of care for additional care plan information)  Current Barriers:  . Diabetes: uncontrolled; most recent A1c 8.1%; [facility]inology ([name]) o Reports recent ~40 lbs weight loss through diet modification. Has been eating 1 meal per day, limiting to <1000 calories.  . Current antihyperglycemic regimen: Metformin 1000 mg BID, Lantus 50 units daily (self decreased from 80 units); reports he has not used rapid acting insulin in ~2 months ? Hx significant GI upset and intolerance with Victoza and Trulicity, denies ever having tried Ozempic before ? Hx allergic rash to [place] ? Notes that he was previously on Humalog and it "made my ankles swell" ? Notes that Lantus "works better" than Toujeo for him ? Previous visit [name] noted "[name] =250 which is consistent with an a1c ~5.9. A1c may not be accurate. Will review at next visit when he has new meter" . Current meal patterns: o Eating 1 "good" meal a day, keeping less than 1000 cal daily  . Current glucose readings: o Fasting: 120-130s; reports that when he checks later in the day it remains consistently . Cardiovascular risk reduction; follows w/ [name] (at last visit, amlodipine, losartan, HCTZ d/c and changed to olmesartan/HCTZ 40/25 mg; recommended to wear compression hose); upon our phone call today, he is unsure which blood pressure medications he is supposed to be taking and does not recall the dose changes made at this appointment. Also reports that he "doesn't take it twice daily because it makes me too sleepy". He cannot verbalize which medication he is not taking twice daily as prescribed, so likely he isn't taking anything twice daily o Current hypertensive  regimen: carvedilol 25 mg BID, clonidine 0.2 BID, olmesartan daily; furosemide 40 mg daily o Current hyperlipidemia regimen: rosuvastatin 20 mg daily (taking 1/2 tablet of 40 mg); LDL at goal <70 . Essential tremor: follows w/ [name].  Marland Kitchen COPD: follows w/ [name]; on Duonebs QID. Takes Qvar PRN  Pharmacist Clinical Goal(s):  Marland Kitchen Over the next 90 days, patient will work with PharmD and primary care provider to address optimized medication management  Interventions: . Comprehensive medication review performed, medication list updated in electronic medical record . Inter-disciplinary care team collaboration (see longitudinal plan of care) . Patient requested a face to face meeting for medication review. Scheduled later this week. Encouraged to bring pill bottles for full review, given multiple recent dose changes by speciality providers.   Patient Self Care Activities:  . Patient will check blood glucose daily , document, and provide at future appointments . Patient will take medications as prescribed . Patient will report any questions or concerns to provider   Please see past updates related to this goal by clicking on the "Past Updates" button in the selected goal         Patient verbalizes understanding of instructions provided today.   Plan: - Scheduled face to face visit in 3 days  Catie Darnelle Maffucci, PharmD, [place] [phone]

## 2019-07-18 NOTE — Chronic Care Management (AMB) (Signed)
Chronic Care Management   Follow Up Note   07/18/2019 Name: Justin Griffith MRN: [id number] DOB: April 27, 1940  Referred by: Justin Lick, Griffith Reason for referral : Chronic Care Management (Medication Management)   Justin Griffith is a 79 y.o. year old male who is a primary care patient of Justin Griffith, Justin Griffith. The CCM team was consulted for assistance with chronic disease management and care coordination needs.    Contacted patient for medication management review.   Review of patient status, including review of consultants reports, relevant laboratory and other test results, and collaboration with appropriate care team members and the patient's provider was performed as part of comprehensive patient evaluation and provision of chronic care management services.    SDOH (Social Determinants of Health) assessments performed: No See Care Plan activities for detailed interventions related to Justin Griffith)     Outpatient Encounter Medications as of 07/18/2019  Medication Sig Note  . insulin glargine (LANTUS) 100 UNIT/ML injection Inject 75 Units into the skin daily.  07/18/2019: 50 units  . metFORMIN (GLUCOPHAGE) 1000 MG tablet Take 1,000 mg by mouth 2 (two) times daily with a meal.    . olmesartan-hydrochlorothiazide (BENICAR HCT) 40-25 MG tablet Take 1 tablet by mouth daily.   Marland Kitchen acyclovir (ZOVIRAX) 400 MG tablet Take 400 mg by mouth 2 (two) times daily.   Marland Kitchen aspirin EC 81 MG tablet Take 81 mg by mouth daily.    . carvedilol (COREG) 25 MG tablet Take 1 tablet (25 mg total) by mouth 2 (two) times daily with a meal.   . cetirizine (ZYRTEC) 10 MG tablet Take 10 mg by mouth daily.   . clobetasol cream (TEMOVATE) [id number] % Apply 1 application topically 2 (two) times daily. Apply to bilateral lower legs.   . cloNIDine (CATAPRES) 0.2 MG tablet Take 0.2 mg by mouth 2 (two) times daily.   . furosemide (LASIX) 20 MG tablet Take 2 tablets (40 mg total) by mouth daily. 05/23/2019: Unsure if he is taking 20 mg or 40  mg daily  . gabapentin (NEURONTIN) 300 MG capsule Start out taking 1 capsule (300 MG) at bedtime by mouth and if tolerated may increase 2 capsules (600 MG) at bedtime.   . insulin regular (NOVOLIN R) 100 units/mL injection Inject 30 Units into the skin 3 (three) times daily before meals.  01/25/2019: Taking PRN when sugar >150  . ipratropium-albuterol (DUONEB) 0.5-2.5 (3) MG/3ML SOLN Take 3 mLs by nebulization every 6 (six) hours. 02/21/2019: Taking QID   . mupirocin ointment (BACTROBAN) 2 % Apply 1 application topically 2 (two) times daily. To open blisters bilateral legs.   . naproxen (NAPROSYN) 500 MG tablet Take 500 mg by mouth 2 (two) times daily with a meal. Arthritis 05/23/2019: PRN   . QVAR REDIHALER 40 MCG/ACT inhaler 1 puff 2 (two) times daily.  05/23/2019: Using PRN  . rosuvastatin (CRESTOR) 40 MG tablet Take 1 tablet (40 mg total) by mouth at bedtime. (Patient taking differently: Take 40 mg by mouth at bedtime. 20 mg once a day)   . tamsulosin (FLOMAX) 0.4 MG CAPS capsule Take 0.4 mg by mouth daily.   . vitamin B-12 (CYANOCOBALAMIN) 500 MCG tablet Take 500 mcg by mouth daily.   . Vitamin D, Cholecalciferol, 25 MCG (1000 UT) CAPS Take 25 mcg by mouth daily.   . [DISCONTINUED] amLODipine (NORVASC) 5 MG tablet Take 1 tablet (5 mg total) by mouth daily. (Patient taking differently: Take 10 mg by mouth daily. )   . [  DISCONTINUED] hydrochlorothiazide (HYDRODIURIL) 25 MG tablet Take 25 mg by mouth daily.   . [DISCONTINUED] losartan (COZAAR) 100 MG tablet Take 100 mg by mouth daily.    No facility-administered encounter medications on file as of 07/18/2019.     Objective:   Goals Addressed            This Visit's Progress     Patient Stated   . PharmD "I don't know much about my diabetes" (pt-stated)       CARE PLAN ENTRY (see longtitudinal plan of care for additional care plan information)  Current Barriers:  . Diabetes: uncontrolled; most recent A1c 8.1%; [facility]  Justin Griffith endocrinology (Justin Griffith) o Reports recent ~40 lbs weight loss through diet modification. Has been eating 1 meal per day, limiting to <1000 calories.  . Current antihyperglycemic regimen: Metformin 1000 mg BID, Lantus 50 units daily (self decreased from 80 units); reports he has not used rapid acting insulin in ~2 months ? Hx significant GI upset and intolerance with Victoza and Trulicity, denies ever having tried Ozempic before ? Hx allergic rash to [place] ? Notes that he was previously on Humalog and it "made my ankles swell" ? Notes that Lantus "works better" than Toujeo for him ? Previous visit Justin Griffith noted "[name] =250 which is consistent with an a1c ~5.9. A1c may not be accurate. Will review at next visit when he has new meter" . Current meal patterns: o Eating 1 "good" meal a day, keeping less than 1000 cal daily  . Current glucose readings: o Fasting: 120-130s; reports that when he checks later in the day it remains consistently . Cardiovascular risk reduction; follows w/ Justin Griffith (at last visit, amlodipine, losartan, HCTZ d/c and changed to olmesartan/HCTZ 40/25 mg; recommended to wear compression hose); upon our phone call today, he is unsure which blood pressure medications he is supposed to be taking and does not recall the dose changes made at this appointment. Also reports that he "doesn't take it twice daily because it makes me too sleepy". He cannot verbalize which medication he is not taking twice daily as prescribed, so likely he isn't taking anything twice daily o Current hypertensive regimen: carvedilol 25 mg BID, clonidine 0.2 BID, olmesartan daily; furosemide 40 mg daily o Current hyperlipidemia regimen: rosuvastatin 20 mg daily (taking 1/2 tablet of 40 mg); LDL at goal <70 . Essential tremor: follows w/ Justin Griffith.  Marland Kitchen COPD: follows w/ Justin Griffith; on Duonebs QID. Takes Qvar PRN  Pharmacist Clinical Goal(s):  Marland Kitchen Over the next  90 days, patient will work with PharmD and primary care provider to address optimized medication management  Interventions: . Comprehensive medication review performed, medication list updated in electronic medical record . Inter-disciplinary care team collaboration (see longitudinal plan of care) . Patient requested a face to face meeting for medication review. Scheduled later this week. Encouraged to bring pill bottles for full review, given multiple recent dose changes by speciality providers.   Patient Self Care Activities:  . Patient will check blood glucose daily , document, and provide at future appointments . Patient will take medications as prescribed . Patient will report any questions or concerns to provider   Please see past updates related to this goal by clicking on the "Past Updates" button in the selected goal          Plan: - Scheduled face to face visit in 3 days  Catie Darnelle Maffucci, PharmD, [place]  [facility] [place] [phone]

## 2019-07-21 ENCOUNTER — Ambulatory Visit: Payer: Medicare Other | Admitting: Pharmacist

## 2019-07-21 ENCOUNTER — Other Ambulatory Visit: Payer: Self-pay

## 2019-07-21 VITALS — Wt 305.0 lb

## 2019-07-21 DIAGNOSIS — Z794 Long term (current) use of insulin: Secondary | ICD-10-CM | POA: Diagnosis not present

## 2019-07-21 DIAGNOSIS — E1159 Type 2 diabetes mellitus with other circulatory complications: Secondary | ICD-10-CM

## 2019-07-21 DIAGNOSIS — I10 Essential (primary) hypertension: Secondary | ICD-10-CM

## 2019-07-21 DIAGNOSIS — E1169 Type 2 diabetes mellitus with other specified complication: Secondary | ICD-10-CM | POA: Diagnosis not present

## 2019-07-21 DIAGNOSIS — I152 Hypertension secondary to endocrine disorders: Secondary | ICD-10-CM

## 2019-07-21 DIAGNOSIS — E785 Hyperlipidemia, unspecified: Secondary | ICD-10-CM

## 2019-07-21 DIAGNOSIS — E1165 Type 2 diabetes mellitus with hyperglycemia: Secondary | ICD-10-CM

## 2019-07-21 NOTE — Chronic Care Management (AMB) (Signed)
Chronic Care Management   Follow Up Note   07/21/2019 Name: Justin Griffith MRN: [id number] DOB: 1940-09-12  Referred by: Justin Lick, NP Reason for referral : Chronic Care Management (Medication Management)   Justin Griffith is a 79 y.o. year old male who is a primary care patient of [name], Justin Faster, NP. The CCM team was consulted for assistance with chronic disease management and care coordination needs.    Contacted patient for medication management review.   Review of patient status, including review of consultants reports, relevant laboratory and other test results, and collaboration with appropriate care team members and the patient's provider was performed as part of comprehensive patient evaluation and provision of chronic care management services.    SDOH (Social Determinants of Health) assessments performed: No See Care Plan activities for detailed interventions related to [facility])     Outpatient Encounter Medications as of 07/21/2019  Medication Sig Note  . acyclovir (ZOVIRAX) 400 MG tablet Take 400 mg by mouth 2 (two) times daily.   Marland Kitchen aspirin EC 81 MG tablet Take 81 mg by mouth daily.    . carvedilol (COREG) 25 MG tablet Take 1 tablet (25 mg total) by mouth 2 (two) times daily with a meal.   . cetirizine (ZYRTEC) 10 MG tablet Take 10 mg by mouth daily.   . cloNIDine (CATAPRES) 0.2 MG tablet Take 0.2 mg by mouth 2 (two) times daily.   . furosemide (LASIX) 20 MG tablet Take 2 tablets (40 mg total) by mouth daily.   Marland Kitchen gabapentin (NEURONTIN) 300 MG capsule Start out taking 1 capsule (300 MG) at bedtime by mouth and if tolerated may increase 2 capsules (600 MG) at bedtime.   . insulin glargine (LANTUS) 100 UNIT/ML injection Inject 75 Units into the skin daily.  07/18/2019: 50 units  . metFORMIN (GLUCOPHAGE) 1000 MG tablet Take 1,000 mg by mouth 2 (two) times daily with a meal.    . rosuvastatin (CRESTOR) 40 MG tablet Take 1 tablet (40 mg total) by mouth at bedtime. (Patient  taking differently: Take 40 mg by mouth at bedtime. 20 mg once a day)   . tamsulosin (FLOMAX) 0.4 MG CAPS capsule Take 0.4 mg by mouth daily.   . vitamin B-12 (CYANOCOBALAMIN) 500 MCG tablet Take 500 mcg by mouth daily.   . Vitamin D, Cholecalciferol, 25 MCG (1000 UT) CAPS Take 50 mcg by mouth daily.    . clobetasol cream (TEMOVATE) AB-123456789 % Apply 1 application topically 2 (two) times daily. Apply to bilateral lower legs.   . insulin regular (NOVOLIN R) 100 units/mL injection Inject 30 Units into the skin 3 (three) times daily before meals.  01/25/2019: Taking PRN when sugar >150  . ipratropium-albuterol (DUONEB) 0.5-2.5 (3) MG/3ML SOLN Take 3 mLs by nebulization every 6 (six) hours. 02/21/2019: Taking QID   . mupirocin ointment (BACTROBAN) 2 % Apply 1 application topically 2 (two) times daily. To open blisters bilateral legs.   . naproxen (NAPROSYN) 500 MG tablet Take 500 mg by mouth 2 (two) times daily with a meal. Arthritis 05/23/2019: PRN   . olmesartan-hydrochlorothiazide (BENICAR HCT) 40-25 MG tablet Take 1 tablet by mouth daily.   Marland Kitchen QVAR REDIHALER 40 MCG/ACT inhaler 1 puff 2 (two) times daily.  05/23/2019: Using PRN   No facility-administered encounter medications on file as of 07/21/2019.     Objective:   Goals Addressed            This Visit's Progress     Patient  Stated   . PharmD "I don't know much about my diabetes" (pt-stated)       CARE PLAN ENTRY (see longtitudinal plan of care for additional care plan information)  Current Barriers:  . Diabetes: uncontrolled; most recent A1c 8.1%; [name]ology (Dr. Honor Griffith) o Continues on his weight loss journey. Weight in clinic today 305 o Confusion regarding appropriate BP medications. Reports he saw [name] provider within the past few weeks, likely was unaware of recent cardiology medication changes . Current antihyperglycemic regimen: Metformin 1000 mg BID, Lantus 50 units daily (self decreased  from 80 units); reports he has not used rapid acting insulin in ~2 months ? Hx significant GI upset and intolerance with Victoza and Trulicity, denies ever having tried Ozempic before ? Hx allergic rash to [place] ? Notes that he was previously on Humalog and it "made my ankles swell" ? Notes that Lantus "works better" than Toujeo for him ? Previous visit Dr. Honor Griffith noted "[name] =250 which is consistent with an a1c ~5.9. A1c may not be accurate. Will review at next visit when he has new meter" . Current meal patterns: o Eating 1 "good" meal a day, keeping less than 1000 cal daily  . Current glucose readings: o Fasting in the past month remaining 110-140s; 2 hour post prandials 110-160s; occasional low blood sugar episodes during the day, likely related to not eating regular meals . Cardiovascular risk reduction: follows w/ [name].  o Current hypertensive regimen: carvedilol 25 mg BID, clonidine 0.2 BID, olmesartan/HCTZ 40/25 mg daily, furosemide 40 mg daily - patient has continued amlodipine 10 mg, losartan, and furosemide 20 mg daily, has not started olmesartan/HCTZ per cardiology o Current hyperlipidemia regimen: rosuvastatin 20 mg daily (taking 1/2 tablet of 40 mg); LDL at goal <70 . Peripheral neuropathy: endorses SIGNIFICANT benefit w/ gabapentin 300 mg QPM for neuropathy. Notes he has taken 600 mg a few times, but generally only 300 mg QPM . COPD: follows w/ Dr. Raul Griffith; on Duonebs QID. Takes Qvar PRN  Pharmacist Clinical Goal(s):  Marland Kitchen Over the next 90 days, patient will work with PharmD and primary care provider to address optimized medication management  Interventions: . Comprehensive medication review performed, medication list updated in electronic medical record . Inter-disciplinary care team collaboration (see longitudinal plan of care) . Praised patient for focus on weight loss to improve overall cardiovascular health . Education on Rule of 15. Handout  provided on appropriate treatment of hypoglycemia . Provided samples of Glucerna + coupons. Encouraged patient to drink a glucerna in the mornings, if he isn't going to eat a meal, to help maintain appropriate sugar control and prevent hypoglycemia . Extensive medication review with patient's pill bottles. Reviewed indication and appropriate dosing. Provided w/ list of how to fill his pill boxes. Collaborated w/ [facility] to fill new script for olmesartan/HCTZ. Discarded amlodipine and lisinopril and empty pill bottles to prevent confusion. Helped patient identify which refills needed to be called into the [name] . Extensive dietary discussion. Provided with Healthy Meal Planning handout. Discussed Plate Method and appropriate carbohydrate portion sizes.   Patient Self Care Activities:  . Patient will check blood glucose daily , document, and provide at future appointments . Patient will take medications as prescribed . Patient will report any questions or concerns to provider   Please see past updates related to this goal by clicking on the "Past Updates" button in the selected goal  Plan:  - Scheduled f/u call in ~ 6 weeks  Catie Darnelle Maffucci, PharmD, [place] [phone]

## 2019-07-21 NOTE — Patient Instructions (Addendum)
It was great to see you today!  This is how you should fill your pill box:   [facility], [facility].   The new blood pressure medication that [place] [facility] called into [place] is olmesartan-hydrochlorothiazide.  Morning: - Furosemide 40 mg (2 tablets) - Olmesartan-hydrochlorothiazide 40/25 mg  - Carvedilol 25 mg  - Clonidine 0.2 mg  - Metformin 1000 mg  - Acyclovir 400 mg - Aspirin 81 mg - Vitamin D - Vitamin B12 - Rosuvastatin 20 mg (1/2 tablet)  Evening: - Tamsulosin 0.4 mg - Carvedilol 25 mg  - Clonidine 0.2 mg  - Metformin 1000 mg  - Acyclovir 400 mg  - Gabapentin 300-600 mg (at bedtime)  Visit Information  Goals Addressed            This Visit's Progress     Patient Stated   . PharmD "I don't know much about my diabetes" (pt-stated)       CARE PLAN ENTRY (see longtitudinal plan of care for additional care plan information)  Current Barriers:  . Diabetes: uncontrolled; most recent A1c 8.1%; [facility] and [facility]inology ([name]) o Continues on his weight loss journey. Weight in clinic today 305 o Confusion regarding appropriate BP medications. Reports he saw [facility] provider within the past few weeks, likely was unaware of recent cardiology medication changes . Current antihyperglycemic regimen: Metformin 1000 mg BID, Lantus 50 units daily (self decreased from 80 units); reports he has not used rapid acting insulin in ~2 months ? Hx significant GI upset and intolerance with Victoza and Trulicity, denies ever having tried Ozempic before ? Hx allergic rash to [place] ? Notes that he was previously on Humalog and it "made my ankles swell" ? Notes that Lantus "works better" than Toujeo for him ? Previous visit [name] noted "[name] =250 which is consistent with an a1c ~5.9. A1c may not be accurate. Will review at next visit when he has new meter" . Current meal  patterns: o Eating 1 "good" meal a day, keeping less than 1000 cal daily  . Current glucose readings: o Fasting in the past month remaining 110-140s; 2 hour post prandials 110-160s; occasional low blood sugar episodes during the day, likely related to not eating regular meals . Cardiovascular risk reduction: follows w/ [name].  o Current hypertensive regimen: carvedilol 25 mg BID, clonidine 0.2 BID, olmesartan/HCTZ 40/25 mg daily, furosemide 40 mg daily - patient has continued amlodipine 10 mg, losartan, and furosemide 20 mg daily, has not started olmesartan/HCTZ per cardiology o Current hyperlipidemia regimen: rosuvastatin 20 mg daily (taking 1/2 tablet of 40 mg); LDL at goal <70 . Peripheral neuropathy: endorses SIGNIFICANT benefit w/ gabapentin 300 mg QPM for neuropathy. Notes he has taken 600 mg a few times, but generally only 300 mg QPM . COPD: follows w/ [name]; on Duonebs QID. Takes Qvar PRN  Pharmacist Clinical Goal(s):  Marland Kitchen Over the next 90 days, patient will work with PharmD and primary care provider to address optimized medication management  Interventions: . Comprehensive medication review performed, medication list updated in electronic medical record . Inter-disciplinary care team collaboration (see longitudinal plan of care) . Praised patient for focus on weight loss to improve overall cardiovascular health . Education on Rule of 15. Handout provided on appropriate treatment of hypoglycemia . Provided samples of Glucerna + coupons. Encouraged patient to drink a glucerna in the mornings, if he isn't going to eat a meal, to  help maintain appropriate sugar control and prevent hypoglycemia . Extensive medication review with patient's pill bottles. Reviewed indication and appropriate dosing. Provided w/ list of how to fill his pill boxes. Collaborated w/ [facility] to fill new script for olmesartan/HCTZ. Discarded amlodipine and lisinopril and empty pill bottles to prevent  confusion. Helped patient identify which refills needed to be called into the [name] . Extensive dietary discussion. Provided with Healthy Meal Planning handout. Discussed Plate Method and appropriate carbohydrate portion sizes.   Patient Self Care Activities:  . Patient will check blood glucose daily , document, and provide at future appointments . Patient will take medications as prescribed . Patient will report any questions or concerns to provider   Please see past updates related to this goal by clicking on the "Past Updates" button in the selected goal         Patient verbalizes understanding of instructions provided today.    Plan:  - Scheduled f/u call in ~ 6 weeks  Catie Darnelle Maffucci, PharmD, [place] [phone]

## 2019-07-29 ENCOUNTER — Other Ambulatory Visit: Payer: Self-pay | Admitting: Nurse Practitioner

## 2019-07-29 NOTE — Telephone Encounter (Signed)
Requested Prescriptions  Pending Prescriptions Disp Refills  . mupirocin ointment (BACTROBAN) 2 % [Pharmacy Med Name: MUPIROCIN 2% OINTMENT] 22 g 0    Sig: Apply 1 application topically 2 (two) times daily. To open blisters bilateral legs.     Off-Protocol Failed - 07/29/2019  1:08 PM      Failed - Medication not assigned to a protocol, review manually.      Passed - Valid encounter within last 12 months    Recent Outpatient Visits          2 weeks ago Morbid obesity ([place])   [place], [place] T, NP   2 months ago Type 2 diabetes mellitus with hyperglycemia, with long-term current use of insulin ([place])   [place], [place] T, NP   3 months ago Transient alteration of awareness   [place], [place] T, NP   3 months ago Transient alteration of awareness   [place], Jolene T, NP   4 months ago Type 2 diabetes mellitus with hyperglycemia, with long-term current use of insulin ([place])   [place], [name], NP      Future Appointments            In 3 weeks  [place], [facility]   In 3 weeks [name], [name], NP [employer], [facility]

## 2019-08-11 ENCOUNTER — Ambulatory Visit (INDEPENDENT_AMBULATORY_CARE_PROVIDER_SITE_OTHER): Payer: Medicare Other | Admitting: General Practice

## 2019-08-11 ENCOUNTER — Telehealth: Payer: Medicare Other | Admitting: General Practice

## 2019-08-11 DIAGNOSIS — Z794 Long term (current) use of insulin: Secondary | ICD-10-CM

## 2019-08-11 DIAGNOSIS — J449 Chronic obstructive pulmonary disease, unspecified: Secondary | ICD-10-CM | POA: Diagnosis not present

## 2019-08-11 DIAGNOSIS — I10 Essential (primary) hypertension: Secondary | ICD-10-CM

## 2019-08-11 DIAGNOSIS — R600 Localized edema: Secondary | ICD-10-CM

## 2019-08-11 DIAGNOSIS — E1165 Type 2 diabetes mellitus with hyperglycemia: Secondary | ICD-10-CM

## 2019-08-11 DIAGNOSIS — E785 Hyperlipidemia, unspecified: Secondary | ICD-10-CM

## 2019-08-11 DIAGNOSIS — E1169 Type 2 diabetes mellitus with other specified complication: Secondary | ICD-10-CM

## 2019-08-11 DIAGNOSIS — I152 Hypertension secondary to endocrine disorders: Secondary | ICD-10-CM

## 2019-08-11 DIAGNOSIS — E1159 Type 2 diabetes mellitus with other circulatory complications: Secondary | ICD-10-CM

## 2019-08-11 NOTE — Chronic Care Management (AMB) (Signed)
Chronic Care Management   Follow Up Note   08/11/2019 Name: Justin Griffith MRN: [id number] DOB: 05-Apr-1940  Referred by: [name], NP Reason for referral : Chronic Care Management (Follow up: Chronic conditions and care coordination needs: DM2/HTN/COPD- use of ted hose)   Justin Griffith is a 79 y.o. year old male who is a primary care patient of [name], [name], NP. The CCM team was consulted for assistance with chronic disease management and care coordination needs.    Review of patient status, including review of consultants reports, relevant laboratory and other test results, and collaboration with appropriate care team members and the patient's provider was performed as part of comprehensive patient evaluation and provision of chronic care management services.    SDOH (Social Determinants of Health) assessments performed: Yes See Care Plan activities for detailed interventions related to [facility])     Outpatient Encounter Medications as of 08/11/2019  Medication Sig Note   acyclovir (ZOVIRAX) 400 MG tablet Take 400 mg by mouth 2 (two) times daily.    aspirin EC 81 MG tablet Take 81 mg by mouth daily.     carvedilol (COREG) 25 MG tablet Take 1 tablet (25 mg total) by mouth 2 (two) times daily with a meal.    cetirizine (ZYRTEC) 10 MG tablet Take 10 mg by mouth daily.    clobetasol cream (TEMOVATE) AB-123456789 % Apply 1 application topically 2 (two) times daily. Apply to bilateral lower legs.    cloNIDine (CATAPRES) 0.2 MG tablet Take 0.2 mg by mouth 2 (two) times daily.    furosemide (LASIX) 20 MG tablet Take 2 tablets (40 mg total) by mouth daily.    gabapentin (NEURONTIN) 300 MG capsule Start out taking 1 capsule (300 MG) at bedtime by mouth and if tolerated may increase 2 capsules (600 MG) at bedtime.    insulin glargine (LANTUS) 100 UNIT/ML injection Inject 75 Units into the skin daily.  07/18/2019: 50 units   insulin regular (NOVOLIN R) 100 units/mL injection Inject 30  Units into the skin 3 (three) times daily before meals.  01/25/2019: Taking PRN when sugar >150   ipratropium-albuterol (DUONEB) 0.5-2.5 (3) MG/3ML SOLN Take 3 mLs by nebulization every 6 (six) hours. 02/21/2019: Taking QID    metFORMIN (GLUCOPHAGE) 1000 MG tablet Take 1,000 mg by mouth 2 (two) times daily with a meal.     mupirocin ointment (BACTROBAN) 2 % Apply 1 application topically 2 (two) times daily. To open blisters bilateral legs.    naproxen (NAPROSYN) 500 MG tablet Take 500 mg by mouth 2 (two) times daily with a meal. Arthritis 05/23/2019: PRN    olmesartan-hydrochlorothiazide (BENICAR HCT) 40-25 MG tablet Take 1 tablet by mouth daily.    QVAR REDIHALER 40 MCG/ACT inhaler 1 puff 2 (two) times daily.  05/23/2019: Using PRN   rosuvastatin (CRESTOR) 40 MG tablet Take 1 tablet (40 mg total) by mouth at bedtime. (Patient taking differently: Take 40 mg by mouth at bedtime. 20 mg once a day)    tamsulosin (FLOMAX) 0.4 MG CAPS capsule Take 0.4 mg by mouth daily.    vitamin B-12 (CYANOCOBALAMIN) 500 MCG tablet Take 500 mcg by mouth daily.    Vitamin D, Cholecalciferol, 25 MCG (1000 UT) CAPS Take 50 mcg by mouth daily.     No facility-administered encounter medications on file as of 08/11/2019.     Objective:  BP Readings from Last 3 Encounters:  07/14/19 (!) 141/78  05/23/19 138/64  04/25/19 140/83  Goals Addressed            This Visit's Progress    RNCM: Pt- " I am concerned about the swelling in my legs" (pt-stated)       CARE PLAN ENTRY (see longtitudinal plan of care for additional care plan information)  Current Barriers:   Chronic Disease Management support, education, and care coordination needs related to HTN, HLD, COPD, and DMII  Clinical Goal(s) related to HTN, HLD, COPD, and DMII:  Over the next 120 days, patient will:   Work with the care management team to address educational, disease management, and care coordination needs   Begin or continue self  health monitoring activities as directed today Measure and record cbg (blood glucose) 3-4 times daily and Measure and record blood pressure 3 times per week  Call provider office for new or worsened signs and symptoms Blood glucose findings outside established parameters, Blood pressure findings outside established parameters, Weight outside established parameters, Oxygen saturation lower than established parameter, Shortness of breath, and New or worsened symptom related to Swelling in legs, and other changes in condition   Call care management team with questions or concerns  Verbalize basic understanding of patient centered plan of care established today  Interventions related to HTN, HLD, COPD, and DMII:   Evaluation of current treatment plans and patient's adherence to plan as established by provider.  Per the patient he has lost down to 302.  He has cut out ice cream and potato chips and eating one "good meal" a day. This is usually at lunch or supper.  The patient states that he has decreased his Lantus from 80 units to 50 units because the Lantus at 80 units was too much. Blood sugars are running 100-140.  Assessed patient understanding of disease states-patient is ready to make positive changes to improve his health and well being. The patient has made positive lifestyle changes to help his health and well being. Praised for weight loss and changing his dietary habits.   Assessed patient's education and care coordination needs-the patient sees several different specialist and also the [name]. The patient has several healthcare practices he does at home. Education and support given  Evaluation of next appointment with pcp is on June the 2nd.  The wellness nurse will also be at the appointment.   Evaluation of swelling in bilateral legs and feet. The patient verbalized that his swelling has gone down in his feet and legs considerably. He still has more in his left leg/foot  than right leg/foot.  The patient has been unsuccessful at finding ted hose that work for him. Research done and there are 3XL size available for purchase. The RNCM will  have a pair of 3XL [place] Anti-embolism cap knee high inspection toe stockings 18 mmhg- white for the patient to try for help with reducing swelling further in his legs. Information for purchase of ted hose if this works for the patient is: http://young.com/. SKU#561- $4.39 a pair- does not include shipping and handling. Will have this information for the patient if he chooses to order more of the ted stockings.   Provided disease specific education to patient-[place]/ADA diet.  The patient has had significant weight loss. Praised for positive dietary changes to promote health and well being.  Collaborated with appropriate clinical care team members regarding patient needs.  Currently working with the CCM pharmacist. Will collaborate with the pcp and pharmacist concerning patients stated dosing changes of Lantus. The patient verbalized he had  not taken the Novolin since March.    Patient Self Care Activities related to HTN, HLD, COPD, and DMII:   Patient is unable to independently self-manage chronic health conditions  Please see past updates related to this goal by clicking on the "Past Updates" button in the selected goal          Plan:   The care management team will reach out to the patient again over the next 60 days.  Face to Face appointment with care management team member scheduled for: 08-23-2019 follow up from call from [facility] on 08-11-2019.   Noreene Larsson RN, MSN, [facility] Mobile: [phone]

## 2019-08-11 NOTE — Patient Instructions (Signed)
Visit Information  Goals Addressed            This Visit's Progress   . RNCM: Pt- " I am concerned about the swelling in my legs" (pt-stated)       CARE PLAN ENTRY (see longtitudinal plan of care for additional care plan information)  Current Barriers:  . Chronic Disease Management support, education, and care coordination needs related to HTN, HLD, COPD, and DMII  Clinical Goal(s) related to HTN, HLD, COPD, and DMII:  Over the next 120 days, patient will:  . Work with the care management team to address educational, disease management, and care coordination needs  . Begin or continue self health monitoring activities as directed today Measure and record cbg (blood glucose) 3-4 times daily and Measure and record blood pressure 3 times per week . Call provider office for new or worsened signs and symptoms Blood glucose findings outside established parameters, Blood pressure findings outside established parameters, Weight outside established parameters, Oxygen saturation lower than established parameter, Shortness of breath, and New or worsened symptom related to Swelling in legs, and other changes in condition  . Call care management team with questions or concerns . Verbalize basic understanding of patient centered plan of care established today  Interventions related to HTN, HLD, COPD, and DMII:  . Evaluation of current treatment plans and patient's adherence to plan as established by provider.  Per the patient he has lost down to 302.  He has cut out ice cream and potato chips and eating one "good meal" a day. This is usually at lunch or supper.  The patient states that he has decreased his Lantus from 80 units to 50 units because the Lantus at 80 units was too much. Blood sugars are running 100-140. Marland Kitchen Assessed patient understanding of disease states-patient is ready to make positive changes to improve his health and well being. The patient has made positive lifestyle changes to help his  health and well being. Praised for weight loss and changing his dietary habits.  . Assessed patient's education and care coordination needs-the patient sees several different specialist and also the [name]. The patient has several healthcare practices he does at home. Education and support given . Evaluation of next appointment with pcp is on June the 2nd.  The wellness nurse will also be at the appointment.  . Evaluation of swelling in bilateral legs and feet. The patient verbalized that his swelling has gone down in his feet and legs considerably. He still has more in his left leg/foot  than right leg/foot. The patient has been unsuccessful at finding ted hose that work for him. Research done and there are 3XL size available for purchase. The RNCM will  have a pair of 3XL [place] Anti-embolism cap knee high inspection toe stockings 18 mmhg- white for the patient to try for help with reducing swelling further in his legs. Information for purchase of ted hose if this works for the patient is: http://young.com/. SKU#561- $4.39 a pair- does not include shipping and handling. Will have this information for the patient if he chooses to order more of the ted stockings.  . Provided disease specific education to patient-Heart Healty/ADA diet.  The patient has had significant weight loss. Praised for positive dietary changes to promote health and well being. Nash Dimmer with appropriate clinical care team members regarding patient needs.  Currently working with the CCM pharmacist. Will collaborate with the pcp and pharmacist concerning patients stated dosing changes of Lantus. The patient verbalized  he had not taken the Novolin since March.    Patient Self Care Activities related to HTN, HLD, COPD, and DMII:  . Patient is unable to independently self-manage chronic health conditions  Please see past updates related to this goal by clicking on the "Past Updates" button in the selected goal         Patient  verbalizes understanding of instructions provided today.   The care management team will reach out to the patient again over the next 60 days.  Face to Face appointment with care management team member scheduled for: 08-23-2019 when patient is in the office to see the pcp and wellness nurse.   Noreene Larsson RN, MSN, [facility] Mobile: [phone]

## 2019-08-16 ENCOUNTER — Telehealth: Payer: Self-pay

## 2019-08-16 NOTE — Telephone Encounter (Signed)
Error

## 2019-08-23 ENCOUNTER — Ambulatory Visit (INDEPENDENT_AMBULATORY_CARE_PROVIDER_SITE_OTHER): Payer: Medicare Other | Admitting: General Practice

## 2019-08-23 ENCOUNTER — Ambulatory Visit (INDEPENDENT_AMBULATORY_CARE_PROVIDER_SITE_OTHER): Payer: Medicare Other | Admitting: Nurse Practitioner

## 2019-08-23 ENCOUNTER — Other Ambulatory Visit: Payer: Self-pay

## 2019-08-23 ENCOUNTER — Ambulatory Visit: Payer: Medicare Other | Admitting: Pharmacist

## 2019-08-23 ENCOUNTER — Ambulatory Visit (INDEPENDENT_AMBULATORY_CARE_PROVIDER_SITE_OTHER): Payer: Medicare Other

## 2019-08-23 ENCOUNTER — Encounter: Payer: Self-pay | Admitting: Nurse Practitioner

## 2019-08-23 VITALS — BP 121/73 | HR 56 | Temp 97.4°F | Resp 16 | Ht 71.0 in | Wt 299.0 lb

## 2019-08-23 VITALS — BP 121/73 | HR 56 | Temp 97.4°F | Ht 71.0 in | Wt 299.0 lb

## 2019-08-23 DIAGNOSIS — E1159 Type 2 diabetes mellitus with other circulatory complications: Secondary | ICD-10-CM

## 2019-08-23 DIAGNOSIS — R809 Proteinuria, unspecified: Secondary | ICD-10-CM

## 2019-08-23 DIAGNOSIS — I10 Essential (primary) hypertension: Secondary | ICD-10-CM

## 2019-08-23 DIAGNOSIS — Z0000 Encounter for general adult medical examination without abnormal findings: Secondary | ICD-10-CM

## 2019-08-23 DIAGNOSIS — E1169 Type 2 diabetes mellitus with other specified complication: Secondary | ICD-10-CM

## 2019-08-23 DIAGNOSIS — J449 Chronic obstructive pulmonary disease, unspecified: Secondary | ICD-10-CM | POA: Diagnosis not present

## 2019-08-23 DIAGNOSIS — J441 Chronic obstructive pulmonary disease with (acute) exacerbation: Secondary | ICD-10-CM | POA: Diagnosis not present

## 2019-08-23 DIAGNOSIS — E1129 Type 2 diabetes mellitus with other diabetic kidney complication: Secondary | ICD-10-CM

## 2019-08-23 DIAGNOSIS — E1165 Type 2 diabetes mellitus with hyperglycemia: Secondary | ICD-10-CM | POA: Diagnosis not present

## 2019-08-23 DIAGNOSIS — I152 Hypertension secondary to endocrine disorders: Secondary | ICD-10-CM

## 2019-08-23 DIAGNOSIS — E538 Deficiency of other specified B group vitamins: Secondary | ICD-10-CM

## 2019-08-23 DIAGNOSIS — Z6841 Body Mass Index (BMI) 40.0 and over, adult: Secondary | ICD-10-CM | POA: Diagnosis not present

## 2019-08-23 DIAGNOSIS — Z794 Long term (current) use of insulin: Secondary | ICD-10-CM

## 2019-08-23 DIAGNOSIS — E785 Hyperlipidemia, unspecified: Secondary | ICD-10-CM

## 2019-08-23 DIAGNOSIS — I739 Peripheral vascular disease, unspecified: Secondary | ICD-10-CM

## 2019-08-23 NOTE — Assessment & Plan Note (Signed)
Recommended eating smaller high protein, low fat meals more frequently and exercising 30 mins a day 5 times a week with a goal of 10-15lb weight loss in the next 3 months. Patient voiced their understanding and motivation to adhere to these recommendations.  

## 2019-08-23 NOTE — Chronic Care Management (AMB) (Signed)
Chronic Care Management   Follow Up Note   08/23/2019 Name: Justin Griffith MRN: [id number] DOB: Sep 05, 1940  Referred by: [name], NP Reason for referral : Chronic Care Management (in office visit with the patient to give ted hose and discuss changes in Chronic Conditions)   Justin Griffith is a 79 y.o. year old male who is a primary care patient of [name], [name], NP. The CCM team was consulted for assistance with chronic disease management and care coordination needs.    Review of patient status, including review of consultants reports, relevant laboratory and other test results, and collaboration with appropriate care team members and the patient's provider was performed as part of comprehensive patient evaluation and provision of chronic care management services.    SDOH (Social Determinants of Health) assessments performed: No See Care Plan activities for detailed interventions related to [facility])     Outpatient Encounter Medications as of 08/23/2019  Medication Sig Note  . acyclovir (ZOVIRAX) 400 MG tablet Take 400 mg by mouth 2 (two) times daily.   Marland Kitchen aspirin EC 81 MG tablet Take 81 mg by mouth daily.    . carvedilol (COREG) 25 MG tablet Take 1 tablet (25 mg total) by mouth 2 (two) times daily with a meal.   . cetirizine (ZYRTEC) 10 MG tablet Take 10 mg by mouth daily.   . clobetasol cream (TEMOVATE) [id number] % Apply 1 application topically 2 (two) times daily. Apply to bilateral lower legs.   . cloNIDine (CATAPRES) 0.2 MG tablet Take 0.2 mg by mouth 2 (two) times daily.   . furosemide (LASIX) 20 MG tablet Take 2 tablets (40 mg total) by mouth daily.   Marland Kitchen gabapentin (NEURONTIN) 300 MG capsule Start out taking 1 capsule (300 MG) at bedtime by mouth and if tolerated may increase 2 capsules (600 MG) at bedtime.   . insulin glargine (LANTUS) 100 UNIT/ML injection Inject 75 Units into the skin daily.  07/18/2019: 50 units  . insulin regular (NOVOLIN R) 100 units/mL injection Inject 30  Units into the skin 3 (three) times daily before meals.  01/25/2019: Taking PRN when sugar >150  . ipratropium-albuterol (DUONEB) 0.5-2.5 (3) MG/3ML SOLN Take 3 mLs by nebulization every 6 (six) hours. 02/21/2019: Taking QID   . metFORMIN (GLUCOPHAGE) 1000 MG tablet Take 1,000 mg by mouth 2 (two) times daily with a meal.    . mupirocin ointment (BACTROBAN) 2 % Apply 1 application topically 2 (two) times daily. To open blisters bilateral legs.   . naproxen (NAPROSYN) 500 MG tablet Take 500 mg by mouth 2 (two) times daily with a meal. Arthritis 05/23/2019: PRN   . olmesartan-hydrochlorothiazide (BENICAR HCT) 40-25 MG tablet Take 1 tablet by mouth daily.   Marland Kitchen QVAR REDIHALER 40 MCG/ACT inhaler 1 puff 2 (two) times daily.  05/23/2019: Using PRN  . rosuvastatin (CRESTOR) 40 MG tablet Take 1 tablet (40 mg total) by mouth at bedtime. (Patient taking differently: Take 40 mg by mouth at bedtime. 20 mg once a day)   . tamsulosin (FLOMAX) 0.4 MG CAPS capsule Take 0.4 mg by mouth daily.   . vitamin B-12 (CYANOCOBALAMIN) 500 MCG tablet Take 500 mcg by mouth daily.   . Vitamin D, Cholecalciferol, 25 MCG (1000 UT) CAPS Take 50 mcg by mouth daily.     No facility-administered encounter medications on file as of 08/23/2019.     Objective:  BP Readings from Last 3 Encounters:  07/14/19 (!) 141/78  05/23/19 138/64  04/25/19 140/83  Goals Addressed            This Visit's Progress   . RNCM: Pt- " I am concerned about the swelling in my legs" (pt-stated)       CARE PLAN ENTRY (see longtitudinal plan of care for additional care plan information)  Current Barriers:  . Chronic Disease Management support, education, and care coordination needs related to HTN, HLD, COPD, and DMII  Clinical Goal(s) related to HTN, HLD, COPD, and DMII:  Over the next 120 days, patient will:  . Work with the care management team to address educational, disease management, and care coordination needs  . Begin or continue self health  monitoring activities as directed today Measure and record cbg (blood glucose) 3-4 times daily and Measure and record blood pressure 3 times per week . Call provider office for new or worsened signs and symptoms Blood glucose findings outside established parameters, Blood pressure findings outside established parameters, Weight outside established parameters, Oxygen saturation lower than established parameter, Shortness of breath, and New or worsened symptom related to Swelling in legs, and other changes in condition  . Call care management team with questions or concerns . Verbalize basic understanding of patient centered plan of care established today  Interventions related to HTN, HLD, COPD, and DMII:  . Evaluation of current treatment plans and patient's adherence to plan as established by provider.  Per the patient he has lost down to 302.  He has cut out ice cream and potato chips and eating one "good meal" a day. This is usually at lunch or supper.  The patient states that he has decreased his Lantus from 80 units to 50 units because the Lantus at 80 units was too much. Blood sugars are running 100-140. In the office today the patient states he has gain some weight back because of the holidays but he is back on his regimen and it will come back off.  . Assessed patient understanding of disease states-patient is ready to make positive changes to improve his health and well being. The patient has made positive lifestyle changes to help his health and well being. Praised for weight loss and changing his dietary habits.  . Assessed patient's education and care coordination needs-the patient sees several different specialist and also the [name]. The patient has several healthcare practices he does at home. Education and support given . Evaluation of next appointment with pcp is on June the 2nd.  The wellness nurse will also be at the appointment.  . Evaluation of swelling in bilateral legs and feet. The  patient verbalized that his swelling has gone down in his feet and legs considerably. He still has more in his left leg/foot  than right leg/foot. The patient has been unsuccessful at finding ted hose that work for him. Research done and there are 3XL size available for purchase. The RNCM will  have a pair of 3XL [place] Anti-embolism cap knee high inspection toe stockings 18 mmhg- white for the patient to try for help with reducing swelling further in his legs. Information for purchase of ted hose if this works for the patient is: http://young.com/. SKU#561- $4.39 a pair- does not include shipping and handling. Will have this information for the patient if he chooses to order more of the ted stockings. Saw the patient face to face in the office today. Took the ted hose to the patient. The patient feels these will work well for his legs. The patients legs have decreased swelling.  The  left is more swollen than the right. The right appears to be normal and back to baseline.  . Provided disease specific education to patient-Heart Healty/ADA diet.  The patient has had significant weight loss. Praised for positive dietary changes to promote health and well being. Nash Dimmer with appropriate clinical care team members regarding patient needs.  Currently working with the CCM pharmacist. Will collaborate with the pcp and pharmacist concerning patients stated dosing changes of Lantus. The patient verbalized he had not taken the Novolin since March.  CCM pharmacist visited also to help the patient with questions related to medication that is causing drowsiness.  Patient Self Care Activities related to HTN, HLD, COPD, and DMII:  . Patient is unable to independently self-manage chronic health conditions  Please see past updates related to this goal by clicking on the "Past Updates" button in the selected goal          Plan:   The care management team will reach out to the patient again over the next 30 to  60 days.    Noreene Larsson RN, MSN, [facility] Mobile: [phone]

## 2019-08-23 NOTE — Patient Instructions (Addendum)
Justin Griffith , Thank you for taking time to come for your Medicare Wellness Visit. I appreciate your ongoing commitment to your health goals. Please review the following plan we discussed and let me know if I can assist you in the future.   Screening recommendations/referrals: Colonoscopy: no longer required  Recommended yearly ophthalmology/optometry visit for glaucoma screening and checkup Recommended yearly dental visit for hygiene and checkup  Vaccinations: Influenza vaccine: due 11/2019 Pneumococcal vaccine: completed series  Tdap vaccine: completed 02/20/2018 Shingles vaccine: shingrix completed   Covid-19: completed, please bring card to next visit   Advanced directives: Advance directive discussed with you today. I have provided a copy for you to complete at home and have notarized. Once this is complete please bring a copy in to our office so we can scan it into your chart.  Conditions/risks identified: diabetic, already on chronic care management team. Please schedule and have results faxed over from diabetic eye exam.   Next appointment: Follow up in one year for your annual wellness visit.    Preventive Care 28 Years and Older, Male Preventive care refers to lifestyle choices and visits with your health care provider that can promote health and wellness. What does preventive care include?  A yearly physical exam. This is also called an annual well check.  Dental exams once or twice a year.  Routine eye exams. Ask your health care provider how often you should have your eyes checked.  Personal lifestyle choices, including:  Daily care of your teeth and gums.  Regular physical activity.  Eating a healthy diet.  Avoiding tobacco and drug use.  Limiting alcohol use.  Practicing safe sex.  Taking low doses of aspirin every day.  Taking vitamin and mineral supplements as recommended by your health care provider. What happens during an annual well check? The services  and screenings done by your health care provider during your annual well check will depend on your age, overall health, lifestyle risk factors, and family history of disease. Counseling  Your health care provider may ask you questions about your:  Alcohol use.  Tobacco use.  Drug use.  Emotional well-being.  Home and relationship well-being.  Sexual activity.  Eating habits.  History of falls.  Memory and ability to understand (cognition).  Work and work Statistician. Screening  You may have the following tests or measurements:  Height, weight, and BMI.  Blood pressure.  Lipid and cholesterol levels. These may be checked every 5 years, or more frequently if you are over 53 years old.  Skin check.  Lung cancer screening. You may have this screening every year starting at age 91 if you have a 30-pack-year history of smoking and currently smoke or have quit within the past 15 years.  Fecal occult blood test (FOBT) of the stool. You may have this test every year starting at age 32.  Flexible sigmoidoscopy or colonoscopy. You may have a sigmoidoscopy every 5 years or a colonoscopy every 10 years starting at age 36.  Prostate cancer screening. Recommendations will vary depending on your family history and other risks.  Hepatitis C blood test.  Hepatitis B blood test.  Sexually transmitted disease (STD) testing.  Diabetes screening. This is done by checking your blood sugar (glucose) after you have not eaten for a while (fasting). You may have this done every 1-3 years.  Abdominal aortic aneurysm (AAA) screening. You may need this if you are a current or former smoker.  Osteoporosis. You may be screened starting  at age 86 if you are at high risk. Talk with your health care provider about your test results, treatment options, and if necessary, the need for more tests. Vaccines  Your health care provider may recommend certain vaccines, such as:  Influenza vaccine. This  is recommended every year.  Tetanus, diphtheria, and acellular pertussis (Tdap, Td) vaccine. You may need a Td booster every 10 years.  Zoster vaccine. You may need this after age 34.  Pneumococcal 13-valent conjugate (PCV13) vaccine. One dose is recommended after age 4.  Pneumococcal polysaccharide (PPSV23) vaccine. One dose is recommended after age 46. Talk to your health care provider about which screenings and vaccines you need and how often you need them. This information is not intended to replace advice given to you by your health care provider. Make sure you discuss any questions you have with your health care provider. Document Released: 04/05/2015 Document Revised: 11/27/2015 Document Reviewed: 01/08/2015 Elsevier Interactive Patient Education  2017 [place] Prevention in the Home Falls can cause injuries. They can happen to people of all ages. There are many things you can do to make your home safe and to help prevent falls. What can I do on the outside of my home?  Regularly fix the edges of walkways and driveways and fix any cracks.  Remove anything that might make you trip as you walk through a door, such as a raised step or threshold.  Trim any bushes or trees on the path to your home.  Use bright outdoor lighting.  Clear any walking paths of anything that might make someone trip, such as rocks or tools.  Regularly check to see if handrails are loose or broken. Make sure that both sides of any steps have handrails.  Any raised decks and porches should have guardrails on the edges.  Have any leaves, snow, or ice cleared regularly.  Use sand or salt on walking paths during winter.  Clean up any spills in your garage right away. This includes oil or grease spills. What can I do in the bathroom?  Use night lights.  Install grab bars by the toilet and in the tub and shower. Do not use towel bars as grab bars.  Use non-skid mats or decals in the tub or  shower.  If you need to sit down in the shower, use a plastic, non-slip stool.  Keep the floor dry. Clean up any water that spills on the floor as soon as it happens.  Remove soap buildup in the tub or shower regularly.  Attach bath mats securely with double-sided non-slip rug tape.  Do not have throw rugs and other things on the floor that can make you trip. What can I do in the bedroom?  Use night lights.  Make sure that you have a light by your bed that is easy to reach.  Do not use any sheets or blankets that are too big for your bed. They should not hang down onto the floor.  Have a firm chair that has side arms. You can use this for support while you get dressed.  Do not have throw rugs and other things on the floor that can make you trip. What can I do in the kitchen?  Clean up any spills right away.  Avoid walking on wet floors.  Keep items that you use a lot in easy-to-reach places.  If you need to reach something above you, use a strong step stool that has a grab bar.  Keep electrical cords out of the way.  Do not use floor polish or wax that makes floors slippery. If you must use wax, use non-skid floor wax.  Do not have throw rugs and other things on the floor that can make you trip. What can I do with my stairs?  Do not leave any items on the stairs.  Make sure that there are handrails on both sides of the stairs and use them. Fix handrails that are broken or loose. Make sure that handrails are as long as the stairways.  Check any carpeting to make sure that it is firmly attached to the stairs. Fix any carpet that is loose or worn.  Avoid having throw rugs at the top or bottom of the stairs. If you do have throw rugs, attach them to the floor with carpet tape.  Make sure that you have a light switch at the top of the stairs and the bottom of the stairs. If you do not have them, ask someone to add them for you. What else can I do to help prevent  falls?  Wear shoes that:  Do not have high heels.  Have rubber bottoms.  Are comfortable and fit you well.  Are closed at the toe. Do not wear sandals.  If you use a stepladder:  Make sure that it is fully opened. Do not climb a closed stepladder.  Make sure that both sides of the stepladder are locked into place.  Ask someone to hold it for you, if possible.  Clearly mark and make sure that you can see:  Any grab bars or handrails.  First and last steps.  Where the edge of each step is.  Use tools that help you move around (mobility aids) if they are needed. These include:  Canes.  Walkers.  Scooters.  Crutches.  Turn on the lights when you go into a dark area. Replace any light bulbs as soon as they burn out.  Set up your furniture so you have a clear path. Avoid moving your furniture around.  If any of your floors are uneven, fix them.  If there are any pets around you, be aware of where they are.  Review your medicines with your doctor. Some medicines can make you feel dizzy. This can increase your chance of falling. Ask your doctor what other things that you can do to help prevent falls. This information is not intended to replace advice given to you by your health care provider. Make sure you discuss any questions you have with your health care provider. Document Released: 01/03/2009 Document Revised: 08/15/2015 Document Reviewed: 04/13/2014 Elsevier Interactive Patient Education  2017 [employer].

## 2019-08-23 NOTE — Assessment & Plan Note (Signed)
Ongoing, continue daily B12 supplement.  Recheck B12 level today.

## 2019-08-23 NOTE — Patient Instructions (Signed)
Visit Information  Goals Addressed            This Visit's Progress   . RNCM: Pt- " I am concerned about the swelling in my legs" (pt-stated)       CARE PLAN ENTRY (see longtitudinal plan of care for additional care plan information)  Current Barriers:  . Chronic Disease Management support, education, and care coordination needs related to HTN, HLD, COPD, and DMII  Clinical Goal(s) related to HTN, HLD, COPD, and DMII:  Over the next 120 days, patient will:  . Work with the care management team to address educational, disease management, and care coordination needs  . Begin or continue self health monitoring activities as directed today Measure and record cbg (blood glucose) 3-4 times daily and Measure and record blood pressure 3 times per week . Call provider office for new or worsened signs and symptoms Blood glucose findings outside established parameters, Blood pressure findings outside established parameters, Weight outside established parameters, Oxygen saturation lower than established parameter, Shortness of breath, and New or worsened symptom related to Swelling in legs, and other changes in condition  . Call care management team with questions or concerns . Verbalize basic understanding of patient centered plan of care established today  Interventions related to HTN, HLD, COPD, and DMII:  . Evaluation of current treatment plans and patient's adherence to plan as established by provider.  Per the patient he has lost down to 302.  He has cut out ice cream and potato chips and eating one "good meal" a day. This is usually at lunch or supper.  The patient states that he has decreased his Lantus from 80 units to 50 units because the Lantus at 80 units was too much. Blood sugars are running 100-140. In the office today the patient states he has gain some weight back because of the holidays but he is back on his regimen and it will come back off.  . Assessed patient understanding of  disease states-patient is ready to make positive changes to improve his health and well being. The patient has made positive lifestyle changes to help his health and well being. Praised for weight loss and changing his dietary habits.  . Assessed patient's education and care coordination needs-the patient sees several different specialist and also the [name]. The patient has several healthcare practices he does at home. Education and support given . Evaluation of next appointment with pcp is on June the 2nd.  The wellness nurse will also be at the appointment.  . Evaluation of swelling in bilateral legs and feet. The patient verbalized that his swelling has gone down in his feet and legs considerably. He still has more in his left leg/foot  than right leg/foot. The patient has been unsuccessful at finding ted hose that work for him. Research done and there are 3XL size available for purchase. The RNCM will  have a pair of 3XL [place] Anti-embolism cap knee high inspection toe stockings 18 mmhg- white for the patient to try for help with reducing swelling further in his legs. Information for purchase of ted hose if this works for the patient is: http://young.com/. SKU#561- $4.39 a pair- does not include shipping and handling. Will have this information for the patient if he chooses to order more of the ted stockings. Saw the patient face to face in the office today. Took the ted hose to the patient. The patient feels these will work well for his legs. The patients legs have decreased swelling.  The left is more swollen than the right. The right appears to be normal and back to baseline.  . Provided disease specific education to patient-[facility] [place]/ADA diet.  The patient has had significant weight loss. Praised for positive dietary changes to promote health and well being. Nash Dimmer with appropriate clinical care team members regarding patient needs.  Currently working with the CCM pharmacist. Will  collaborate with the pcp and pharmacist concerning patients stated dosing changes of Lantus. The patient verbalized he had not taken the Novolin since March.  CCM pharmacist visited also to help the patient with questions related to medication that is causing drowsiness.  Patient Self Care Activities related to HTN, HLD, COPD, and DMII:  . Patient is unable to independently self-manage chronic health conditions  Please see past updates related to this goal by clicking on the "Past Updates" button in the selected goal         Patient verbalizes understanding of instructions provided today.   The care management team will reach out to the patient again over the next 30 to 60 days.   Noreene Larsson RN, MSN, [facility] Mobile: [phone]

## 2019-08-23 NOTE — Assessment & Plan Note (Addendum)
Chronic, ongoing with BP at goal today.   Has underlying circulation issues and have recommended he wear compression hose at home + decrease sodium intake.  Continue current medication regimen and adjust as needed + continue collaboration with cardiology and [place] PCP. Continue to check BP daily at home and document.  Reviewed medication recommendations from CCM PharmD and passed on to patient, as he wishes to review with cardiologist first -- reduction of Clonidine to 0.1 MG BID due to fatigue and addition of Spironolactone 12.5 MG daily.  Return in 3 months.

## 2019-08-23 NOTE — Progress Notes (Signed)
BP 121/73   Pulse (!) 56   Temp (!) 97.4 F (36.3 C) (Temporal)   Ht 5\' 11"  (1.803 m)   Wt 299 lb (135.6 kg)   SpO2 92%   BMI 41.70 kg/m    Subjective:    Patient ID: Justin Griffith, male    DOB: 1941/01/06, 79 y.o.   MRN: [id number]  HPI: Justin Griffith is a 79 y.o. male  Chief Complaint  Patient presents with  . Diabetes  . Hypertension  . Hyperlipidemia  . COPD   DIABETES Followed by [name], with next visit 06/14/2019, sees him again in upcoming weeks.  Last A1C was 8.6%.  He also sees a PCP at the [name].  CCM team working with patient.  He reports he is not taking Metformin as it caused diarrhea and is only taking Lantus 50 units.  Has not been taking Novolin at meal time, due to sugars being between 120-150.  He does have a goal of weight loss.   Taking Gabapentin for neuropathy pain and reports this has improved. Hypoglycemic episodes:denies recent, but has had several low morning readings in past Polydipsia/polyuria: no Visual disturbance: no Chest pain: no Paresthesias: no Glucose Monitoring: yes  Accucheck frequency: BID  Fasting glucose:  119 to 143 (was 143 this morning)  Post prandial: 150-160's  Evening:  Before meals: Taking Insulin?: yes  Long acting insulin: Lantus 50 units  Short acting insulin: Blood Pressure Monitoring: not checking Retinal Examination: Going to [place] for exam this month Foot Exam: Up to Date Pneumovax: Up to Date Influenza: Up to Date Aspirin: yes   HYPERTENSION/HLD Continues on Olmesartan, Clonidine, HCTZ, Carvedilol, Lasix 40 MG, and ASA + Crestor. He feels the Clonidine is causing increased fatigue -- has not been taking it twice a day, only taking once a day (around 1 pm).  Sees [name] for cardiology, sees him in June next.  Last EF >55% 06/22/2018.  Hypertension status:uncontrolled Satisfied with current treatment?yes Duration of hypertension:chronic BP monitoring frequency:daily BP range:130- 140 range SBP  and 70-80 DBP BP medication side effects:no Medication compliance:good compliance Previous BP meds:Losartan, Clonidine, HCTZ, Carvedilol, Amlodipine, Lasix Aspirin:yes Recurrent headaches:no Visual changes:no Palpitations:no Dyspnea:At baseline Chest pain:no Lower extremity edema:mild, baseline Dizzy/lightheaded:no  COPD Followed by [name], last seen 02/22/2019. COPD status:stable Satisfied with current treatment?:yes Oxygen use:no Dyspnea frequency:at baseline Cough frequency:present, chronic Rescue inhaler frequency:Uses Duoneb QID Limitation of activity:at times Productive cough: at this time, over past week Last Spirometry:with pulmonary Pneumovax:Up to Date Influenza:Up to Date  Relevant past medical, surgical, family and social history reviewed and updated as indicated. Interim medical history since our last visit reviewed. Allergies and medications reviewed and updated.  Review of Systems  Constitutional: Negative for activity change, diaphoresis, fatigue and fever.  Respiratory: Positive for shortness of breath (at baseline) and wheezing (at baseline). Negative for cough and chest tightness.   Cardiovascular: Positive for leg swelling (improving). Negative for chest pain and palpitations.  Gastrointestinal: Negative.   Endocrine: Negative for cold intolerance, heat intolerance, polydipsia, polyphagia and polyuria.  Neurological: Negative.   Psychiatric/Behavioral: Negative.     Per HPI unless specifically indicated above     Objective:    BP 121/73   Pulse (!) 56   Temp (!) 97.4 F (36.3 C) (Temporal)   Ht 5\' 11"  (1.803 m)   Wt 299 lb (135.6 kg)   SpO2 92%   BMI 41.70 kg/m   Wt Readings from Last 3 Encounters:  08/23/19  299 lb (135.6 kg)  08/23/19 299 lb (135.6 kg)  08/11/19 (!) 302 lb (137 kg)    Physical Exam Vitals and nursing note reviewed.  Constitutional:      General: He is awake. He is not in acute distress.     Appearance: He is well-developed. He is morbidly obese. He is not ill-appearing.  HENT:     Head: Normocephalic and atraumatic.     Right Ear: Hearing normal. No drainage.     Left Ear: Hearing normal. No drainage.  Eyes:     General: Lids are normal.        Right eye: No discharge.        Left eye: No discharge.     Conjunctiva/sclera: Conjunctivae normal.     Pupils: Pupils are equal, round, and reactive to light.  Neck:     Vascular: No carotid bruit.  Cardiovascular:     Rate and Rhythm: Normal rate and regular rhythm.     Heart sounds: Normal heart sounds, S1 normal and S2 normal. No murmur. No gallop.   Pulmonary:     Effort: Pulmonary effort is normal. No accessory muscle usage or respiratory distress.     Breath sounds: Normal breath sounds.  Abdominal:     General: Bowel sounds are normal.     Palpations: Abdomen is soft. There is no splenomegaly.  Musculoskeletal:        General: Normal range of motion.     Cervical back: Normal range of motion and neck supple.     Right lower leg: 1+ Pitting Edema present.     Left lower leg: 1+ Pitting Edema present.  Skin:    General: Skin is warm and dry.     Capillary Refill: Capillary refill takes less than 2 seconds.     Comments: Moderate xerosis BLE, scaling and white.  Neurological:     Mental Status: He is alert and oriented to person, place, and time.  Psychiatric:        Attention and Perception: Attention normal.        Mood and Affect: Mood normal.        Speech: Speech normal.        Behavior: Behavior normal. Behavior is cooperative.     Results for orders placed or performed in visit on 07/14/19  Magnesium  Result Value Ref Range   Magnesium 1.7 1.6 - 2.3 mg/dL  Basic metabolic panel  Result Value Ref Range   Glucose 128 (H) 65 - 99 mg/dL   BUN 9 8 - 27 mg/dL   Creatinine, Ser 0.97 0.76 - 1.27 mg/dL   GFR calc non Af Amer 74 >59 mL/min/1.73   GFR calc Af Amer 85 >59 mL/min/1.73   BUN/Creatinine Ratio 9  (L) 10 - 24   Sodium 140 134 - 144 mmol/L   Potassium 4.9 3.5 - 5.2 mmol/L   Chloride 102 96 - 106 mmol/L   CO2 25 20 - 29 mmol/L   Calcium 10.5 (H) 8.6 - 10.2 mg/dL      Assessment & Plan:   Problem List Items Addressed This Visit      Cardiovascular and Mediastinum   Hypertension associated with diabetes (HCC)    Chronic, ongoing with BP at goal today.   Has underlying circulation issues and have recommended he wear compression hose at home + decrease sodium intake.  Continue current medication regimen and adjust as needed + continue collaboration with cardiology and [place] PCP. Continue to check  BP daily at home and document.  Reviewed medication recommendations from CCM PharmD and passed on to patient, as he wishes to review with cardiologist first -- reduction of Clonidine to 0.1 MG BID due to fatigue and addition of Spironolactone 12.5 MG daily.  Return in 3 months.        Respiratory   COPD (chronic obstructive pulmonary disease) (HCC)    Chronic, ongoing.  Followed by pulmonary.  Continue current medication regimen as prescribed by them and adjust as needed.  Continue Mucinex at home as needed.          Endocrine   Type 2 diabetes mellitus with proteinuria (HCC) - Primary    Chronic, ongoing with poor control.  Followed by endocrinology with recent A1C 8.6%, January urine ALB 150 and A:C >300, continue Olmesartan for kidney protection.  Continue current collaboration and medication regimen as prescribed by them.  Recommend focus on modest weight loss and diet changes.  Review upcoming endocrinology note once available.  Continue collaboration with CCM team to assist in ongoing education about diabetes.  Return in 3 months.      Hyperlipidemia associated with type 2 diabetes mellitus (HCC)    Chronic, ongoing.  Continue current medication regimen and adjust as needed.  Lipid panel today, recent LDL 31.      Relevant Orders   Lipid Panel w/o Chol/HDL Ratio     Other   BMI  40.0-44.9, adult ([place])    Refer to morbid obesity plan, continue focus on healthy diet and exercise regimen.      Morbid obesity ([place])    Recommended eating smaller high protein, low fat meals more frequently and exercising 30 mins a day 5 times a week with a goal of 10-15lb weight loss in the next 3 months. Patient voiced their understanding and motivation to adhere to these recommendations.       B12 deficiency    Ongoing, continue daily B12 supplement.  Recheck B12 level today.      Relevant Orders   Vitamin B12       Follow up plan: Return in about 3 months (around 11/23/2019) for T2DM, HTN/HLD, COPD.

## 2019-08-23 NOTE — Patient Instructions (Signed)
Visit Information  Goals Addressed            This Visit's Progress     Patient Stated   . PharmD "I don't know much about my diabetes" (pt-stated)       CARE PLAN ENTRY (see longtitudinal plan of care for additional care plan information)  Current Barriers:  . Diabetes: uncontrolled; most recent A1c 8.1%; [facility] ([name]) o Worries today about constant sleepiness. Wonders if it is related to his medications . Current antihyperglycemic regimen: Metformin 1000 mg BID, Lantus 50 units daily (self decreased from 80 units); reports he has not used rapid acting insulin in ~2 months ? Hx significant GI upset and intolerance with Victoza and Trulicity, denies ever having tried Ozempic before ? Hx allergic rash to [place] ? Notes that he was previously on Humalog and it "made my ankles swell" ? Notes that Lantus "works better" than Toujeo for him ? Previous visit [name] noted "[name] =250 which is consistent with an a1c ~5.9. A1c may not be accurate. Will review at next visit when he has new meter" . Cardiovascular risk reduction: follows w/ [name].  o Current hypertensive regimen: carvedilol 25 mg BID, clonidine 0.2 BID, olmesartan/HCTZ 40/25 mg daily, furosemide 40 mg daily  o Current hyperlipidemia regimen: rosuvastatin 20 mg daily (taking 1/2 tablet of 40 mg); LDL at goal <70 . Peripheral neuropathy: 300 mg QPM . COPD: follows w/ [name]; on Duonebs QID. Takes Qvar PRN  Pharmacist Clinical Goal(s):  Marland Kitchen Over the next 90 days, patient will work with PharmD and primary care provider to address optimized medication management  Interventions: . Reviewed CNS effects of clonidine. Could be contributing to sedation. Reviewed risk of rebound HTN if he were to stop completely, as patient has hx of self-adjustment and discontinuation of medications. Discussed w/ PCP. Will trial reduction of clonidine to 0.1 mg  BID. Start spironolactone 12.5 mg daily. Recommend BMP in ~5-7 days.  . Discussed need to monitor BP at home. [facility] if [facility] can get [facility] BP [facility].  Patient Self Care Activities:  . Patient will check blood glucose daily , document, and provide at future appointments . Patient will take medications as prescribed . Patient will report any questions or concerns to provider   Please see past updates related to this goal by clicking on the "Past Updates" button in the selected goal         Patient verbalizes understanding of instructions provided today.   Plan:  - Will outreach as previously scheduled  Catie Darnelle Maffucci, PharmD, [place] [phone]

## 2019-08-23 NOTE — Assessment & Plan Note (Signed)
Chronic, ongoing.  Followed by pulmonary.  Continue current medication regimen as prescribed by them and adjust as needed.  Continue Mucinex at home as needed.   

## 2019-08-23 NOTE — Patient Instructions (Addendum)
CCM Pharmacist and Kamille Toomey thoughts for BP medications -- could we change Clonidine to 0.1 MG BID due to this fatigue with this and add on Spironolactone 12.5 MG daily if need BP control -- he is on Lasix and HCTZ too  Diabetes Mellitus and Nutrition, Adult When you have diabetes (diabetes mellitus), it is very important to have healthy eating habits because your blood sugar (glucose) levels are greatly affected by what you eat and drink. Eating healthy foods in the appropriate amounts, at about the same times every day, can help you:  Control your blood glucose.  Lower your risk of heart disease.  Improve your blood pressure.  Reach or maintain a healthy weight. Every person with diabetes is different, and each person has different needs for a meal plan. Your health care provider may recommend that you work with a diet and nutrition specialist (dietitian) to make a meal plan that is best for you. Your meal plan may vary depending on factors such as:  The calories you need.  The medicines you take.  Your weight.  Your blood glucose, blood pressure, and cholesterol levels.  Your activity level.  Other health conditions you have, such as heart or kidney disease. How do carbohydrates affect me? Carbohydrates, also called carbs, affect your blood glucose level more than any other type of food. Eating carbs naturally raises the amount of glucose in your blood. Carb counting is a method for keeping track of how many carbs you eat. Counting carbs is important to keep your blood glucose at a healthy level, especially if you use insulin or take certain oral diabetes medicines. It is important to know how many carbs you can safely have in each meal. This is different for every person. Your dietitian can help you calculate how many carbs you should have at each meal and for each snack. Foods that contain carbs include:  Bread, cereal, rice, pasta, and crackers.  Potatoes and corn.  Peas, beans,  and lentils.  Milk and yogurt.  Fruit and juice.  Desserts, such as cakes, cookies, ice cream, and candy. How does alcohol affect me? Alcohol can cause a sudden decrease in blood glucose (hypoglycemia), especially if you use insulin or take certain oral diabetes medicines. Hypoglycemia can be a life-threatening condition. Symptoms of hypoglycemia (sleepiness, dizziness, and confusion) are similar to symptoms of having too much alcohol. If your health care provider says that alcohol is safe for you, follow these guidelines:  Limit alcohol intake to no more than 1 drink per day for nonpregnant women and 2 drinks per day for men. One drink equals 12 oz of beer, 5 oz of wine, or 1 oz of hard liquor.  Do not drink on an empty stomach.  Keep yourself hydrated with water, diet soda, or unsweetened iced tea.  Keep in mind that regular soda, juice, and other mixers may contain a lot of sugar and must be counted as carbs. What are tips for following this plan?  Reading food labels  Start by checking the serving size on the "Nutrition Facts" label of packaged foods and drinks. The amount of calories, carbs, fats, and other nutrients listed on the label is based on one serving of the item. Many items contain more than one serving per package.  Check the total grams (g) of carbs in one serving. You can calculate the number of servings of carbs in one serving by dividing the total carbs by 15. For example, if a food has 30 g  of total carbs, it would be equal to 2 servings of carbs.  Check the number of grams (g) of saturated and trans fats in one serving. Choose foods that have low or no amount of these fats.  Check the number of milligrams (mg) of salt (sodium) in one serving. Most people should limit total sodium intake to less than 2,300 mg per day.  Always check the nutrition information of foods labeled as "low-fat" or "nonfat". These foods may be higher in added sugar or refined carbs and  should be avoided.  Talk to your dietitian to identify your daily goals for nutrients listed on the label. Shopping  Avoid buying canned, premade, or processed foods. These foods tend to be high in fat, sodium, and added sugar.  Shop around the outside edge of the grocery store. This includes fresh fruits and vegetables, bulk grains, fresh meats, and fresh dairy. Cooking  Use low-heat cooking methods, such as baking, instead of high-heat cooking methods like deep frying.  Cook using healthy oils, such as olive, canola, or sunflower oil.  Avoid cooking with butter, cream, or high-fat meats. Meal planning  Eat meals and snacks regularly, preferably at the same times every day. Avoid going long periods of time without eating.  Eat foods high in fiber, such as fresh fruits, vegetables, beans, and whole grains. Talk to your dietitian about how many servings of carbs you can eat at each meal.  Eat 4-6 ounces (oz) of lean protein each day, such as lean meat, chicken, fish, eggs, or tofu. One oz of lean protein is equal to: ? 1 oz of meat, chicken, or fish. ? 1 egg. ?  cup of tofu.  Eat some foods each day that contain healthy fats, such as avocado, nuts, seeds, and fish. Lifestyle  Check your blood glucose regularly.  Exercise regularly as told by your health care provider. This may include: ? 150 minutes of moderate-intensity or vigorous-intensity exercise each week. This could be brisk walking, biking, or water aerobics. ? Stretching and doing strength exercises, such as yoga or weightlifting, at least 2 times a week.  Take medicines as told by your health care provider.  Do not use any products that contain nicotine or tobacco, such as cigarettes and e-cigarettes. If you need help quitting, ask your health care provider.  Work with a Social worker or diabetes educator to identify strategies to manage stress and any emotional and social challenges. Questions to ask a health care  provider  Do I need to meet with a diabetes educator?  Do I need to meet with a dietitian?  What number can I call if I have questions?  When are the best times to check my blood glucose? Where to find more information:  American Diabetes Association: diabetes.org  Academy of Nutrition and Dietetics: www.eatright.[employer] of Diabetes and Digestive and Kidney Diseases (NIH): [employer] Summary  A healthy meal plan will help you control your blood glucose and maintain a healthy lifestyle.  Working with a diet and nutrition specialist (dietitian) can help you make a meal plan that is best for you.  Keep in mind that carbohydrates (carbs) and alcohol have immediate effects on your blood glucose levels. It is important to count carbs and to use alcohol carefully. This information is not intended to replace advice given to you by your health care provider. Make sure you discuss any questions you have with your health care provider. Document Revised: 02/19/2017 Document Reviewed: 04/13/2016 Elsevier Patient Education  2020 [employer] Inc.  

## 2019-08-23 NOTE — Progress Notes (Signed)
Subjective:   Justin Griffith is a 79 y.o. male who presents for Medicare Annual/Subsequent preventive examination.  Review of Systems:   Cardiac Risk Factors include: advanced age (>59men, >33 women);male gender;dyslipidemia;diabetes mellitus;hypertension;obesity (BMI >30kg/m2);smoking/ tobacco exposure     Objective:    Vitals: BP 121/73 (BP Location: Left Arm, Patient Position: Sitting, Cuff Size: Normal)   Pulse (!) 56   Temp (!) 97.4 F (36.3 C) (Temporal)   Resp 16   Ht 5\' 11"  (1.803 m)   Wt 299 lb (135.6 kg)   BMI 41.70 kg/m   Body mass index is 41.7 kg/m.  Advanced Directives 08/23/2019 12/12/2018 08/22/2018 08/20/2017 02/12/2016 01/08/2016 09/25/2015  Does Patient Have a Medical Advance Directive? No Yes No No No No No  Would patient like information on creating a medical advance directive? - - - Yes (MAU/Ambulatory/Procedural Areas - Information given) No - Patient declined - No - patient declined information    Tobacco Social History   Tobacco Use  Smoking Status Former Smoker  . Packs/day: 1.00  . Years: 30.00  . Pack years: 30.00  . Types: Cigarettes  . Quit date: 03/23/1989  . Years since quitting: 30.4  Smokeless Tobacco Never Used     Counseling given: Not Answered   Clinical Intake:  Pre-visit preparation completed: Yes  Pain : No/denies pain     Nutritional Status: BMI > 30  Obese Nutritional Risks: None Diabetes: Yes CBG done?: No Did pt. bring in CBG monitor from home?: No  How often do you need to have someone help you when you read instructions, pamphlets, or other written materials from your doctor or pharmacy?: 1 - Never  Nutrition Risk Assessment:  Has the patient had any N/V/D within the last 2 months?  no Does the patient have any non-healing wounds?  No  Has the patient had any unintentional weight loss or weight gain?  No   Diabetes:  Is the patient diabetic?  Yes  If diabetic, was a CBG obtained today?  Yes  Did the patient  bring in their glucometer from home?  No  How often do you monitor your CBG's? BID.   Financial Strains and Diabetes Management:  Already in contact with CCM team.   Diabetic Exams:  Diabetic Eye Exam:  Had done at [name].  Diabetic Foot Exam: Completed 03/28/2019.   Interpreter Needed?: No  Information entered by :: Raveena Hebdon,LPN  Past Medical History:  Diagnosis Date  . Arthritis   . Asthma 2000  . Back pain   . Colon polyp 2012  . COPD (chronic obstructive pulmonary disease) ([place])   . Diabetes mellitus without complication ([place])   . Emphysema of lung ([place])   . Heart murmur   . Hernia 2000  . Hyperlipidemia   . Hypertension   . Personal history of malignant neoplasm of large intestine   . Personal history of tobacco use, presenting hazards to health   . Sleep apnea   . Special screening for malignant neoplasms, colon    Past Surgical History:  Procedure Laterality Date  . CARDIAC CATHETERIZATION Left 09/25/2015   Procedure: Left Heart Cath and Coronary Angiography;  Surgeon: Yolonda Kida, MD;  Location: [facility];  Service: Cardiovascular;  Laterality: Left;  . CHOLECYSTECTOMY  1970  . COLON SURGERY  07-16-99   sigmoid colon resection with primary anastomosis, chemotherapy for metastatic disease  . COLONOSCOPY  2001, 2012   Dr Bary Castilla, tubular adenoma of the cecum and ascending  colon in 2012.  Marland Kitchen COLONOSCOPY WITH PROPOFOL N/A 02/12/2016   Procedure: COLONOSCOPY WITH PROPOFOL;  Surgeon: Robert Bellow, MD;  Location: [facility] ENDOSCOPY;  Service: Endoscopy;  Laterality: N/A;  . ESOPHAGOGASTRODUODENOSCOPY (EGD) WITH PROPOFOL N/A 02/12/2016   Procedure: ESOPHAGOGASTRODUODENOSCOPY (EGD) WITH PROPOFOL;  Surgeon: Robert Bellow, MD;  Location: [facility] ENDOSCOPY;  Service: Endoscopy;  Laterality: N/A;  . HERNIA REPAIR Right    right inguinal hernia repair  . JOINT REPLACEMENT Bilateral    knee replacement  . KNEE SURGERY Bilateral 2010  . MYRINGOTOMY WITH TUBE  PLACEMENT Bilateral 08/16/2014   Procedure: MYRINGOTOMY WITH TUBE PLACEMENT;  Surgeon: Carloyn Manner, MD;  Location: [facility] ORS;  Service: ENT;  Laterality: Bilateral;  . PERIPHERAL VASCULAR CATHETERIZATION Right 09/04/2015   Procedure: Lower Extremity Angiography;  Surgeon: Katha Cabal, MD;  Location: [facility];  Service: Cardiovascular;  Laterality: Right;  . PERIPHERAL VASCULAR CATHETERIZATION  09/04/2015   Procedure: Lower Extremity Intervention;  Surgeon: Katha Cabal, MD;  Location: [facility];  Service: Cardiovascular;;  . TONSILLECTOMY    . TYMPANOSTOMY TUBE PLACEMENT     Family History  Problem Relation Age of Onset  . Diabetes Father   . Esophageal cancer Mother   . Alzheimer's disease Paternal Uncle    Social History   Socioeconomic History  . Marital status: Married    Spouse name: Not on file  . Number of children: Not on file  . Years of education: Not on file  . Highest education level: GED or equivalent  Occupational History  . Occupation: retired    Comment: [employer]  Tobacco Use  . Smoking status: Former Smoker    Packs/day: 1.00    Years: 30.00    Pack years: 30.00    Types: Cigarettes    Quit date: 03/23/1989    Years since quitting: 30.4  . Smokeless tobacco: Never Used  Substance and Sexual Activity  . Alcohol use: No    Alcohol/week: 0.0 standard drinks  . Drug use: No  . Sexual activity: Not on file  Other Topics Concern  . 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 Difficulty of Paying Living Expenses: Not hard at all  Food Insecurity: No Food Insecurity  . Worried About Charity fundraiser in the Last Year: Never true  . Ran Out of Food in the Last Year: Never true  Transportation Needs: No Transportation Needs  . Lack of Transportation (Medical): No  . Lack of Transportation (Non-Medical): No  Physical  Activity: Inactive  . Days of Exercise per Week: 0 days  . Minutes of Exercise per Session: 0 min  Stress: No Stress Concern Present  . Feeling of Stress : Only a little  Social Connections: Not Isolated  . Frequency of Communication with Friends and Family: More than three times a week  . Frequency of Social Gatherings with Friends and Family: More than three times a week  . Attends Religious Services: More than 4 times per year  . Active Member of Clubs or Organizations: Yes  . Attends Archivist Meetings: More than 4 times per year  . Marital Status: Married    Outpatient Encounter Medications as of 08/23/2019  Medication Sig  . acyclovir (ZOVIRAX) 400 MG tablet Take 400 mg by mouth 2 (two) times daily.  Marland Kitchen aspirin  EC 81 MG tablet Take 81 mg by mouth daily.   . carvedilol (COREG) 25 MG tablet Take 1 tablet (25 mg total) by mouth 2 (two) times daily with a meal.  . clobetasol cream (TEMOVATE) [id number] % Apply 1 application topically 2 (two) times daily. Apply to bilateral lower legs.  . cloNIDine (CATAPRES) 0.2 MG tablet Take 0.2 mg by mouth 2 (two) times daily.  . furosemide (LASIX) 20 MG tablet Take 2 tablets (40 mg total) by mouth daily.  Marland Kitchen gabapentin (NEURONTIN) 300 MG capsule Start out taking 1 capsule (300 MG) at bedtime by mouth and if tolerated may increase 2 capsules (600 MG) at bedtime.  . insulin glargine (LANTUS) 100 UNIT/ML injection Inject 75 Units into the skin daily.   Marland Kitchen ipratropium-albuterol (DUONEB) 0.5-2.5 (3) MG/3ML SOLN Take 3 mLs by nebulization every 6 (six) hours.  . mupirocin ointment (BACTROBAN) 2 % Apply 1 application topically 2 (two) times daily. To open blisters bilateral legs.  Marland Kitchen olmesartan-hydrochlorothiazide (BENICAR HCT) 40-25 MG tablet Take 1 tablet by mouth daily.  . rosuvastatin (CRESTOR) 40 MG tablet Take 1 tablet (40 mg total) by mouth at bedtime. (Patient taking differently: Take 40 mg by mouth at bedtime. 20 mg once a day)  . tamsulosin  (FLOMAX) 0.4 MG CAPS capsule Take 0.4 mg by mouth daily.  . vitamin B-12 (CYANOCOBALAMIN) 500 MCG tablet Take 1,000 mcg by mouth daily. Two at night  . Vitamin D, Cholecalciferol, 25 MCG (1000 UT) CAPS Take 50 mcg by mouth daily. 2 a night  . cetirizine (ZYRTEC) 10 MG tablet Take 10 mg by mouth daily.  . insulin regular (NOVOLIN R) 100 units/mL injection Inject 30 Units into the skin 3 (three) times daily before meals.   . metFORMIN (GLUCOPHAGE) 1000 MG tablet Take 1,000 mg by mouth 2 (two) times daily with a meal.   . naproxen (NAPROSYN) 500 MG tablet Take 500 mg by mouth 2 (two) times daily with a meal. Arthritis  . QVAR REDIHALER 40 MCG/ACT inhaler 1 puff 2 (two) times daily.    No facility-administered encounter medications on file as of 08/23/2019.    Activities of Daily Living In your present state of health, do you have any difficulty performing the following activities: 08/23/2019  Hearing? Y  Comment hearing aids, bilateral  Vision? N  Comment exam at [facility], reading glasses  Difficulty concentrating or making decisions? N  Walking or climbing stairs? Y  Comment SOB, Knee pain  Dressing or bathing? N  Doing errands, shopping? N  Preparing Food and eating ? N  Using the Toilet? N  In the past six months, have you accidently leaked urine? N  Do you have problems with loss of bowel control? N  Managing your Medications? N  Managing your Finances? N  Housekeeping or managing your Housekeeping? N  Some recent data might be hidden    Patient Care Team: [name], NP as PCP - General (Nurse Practitioner) Guadalupe Maple, MD (Family Medicine) Byrnett, Forest Gleason, MD (General Surgery) Carloyn Manner, MD as Referring Physician (Otolaryngology) Schnier, Dolores Lory, MD (Vascular Surgery) Abisogun, Domenica Reamer, MD as Consulting Physician (Internal Medicine) De Hollings[place], [facility] as Pharmacist (Pharmacist) Vanita Ingles, RN as Registered Nurse (General Practice)     Assessment:   This is a routine wellness examination for Demeko.  Exercise Activities and Dietary recommendations Current Exercise Habits: The patient does not participate in regular exercise at present, Exercise limited by: respiratory conditions(s)  Goals Addressed  None     Fall Risk Fall Risk  08/23/2019 04/25/2019 08/22/2018 08/20/2017 04/29/2017  Falls in the past year? 1 1 0 No No  Number falls in past yr: 1 1 - - -  Injury with Fall? 0 0 - - -  Risk for fall due to : Impaired balance/gait;Medication side effect - - - -  Follow up Falls prevention discussed Falls evaluation completed - - -   FALL RISK PREVENTION PERTAINING TO THE HOME:  Any stairs in or around the home? No  If so, are there any without handrails? No   Home free of loose throw rugs in walkways, pet beds, electrical cords, etc? Yes  Adequate lighting in your home to reduce risk of falls? Yes   ASSISTIVE DEVICES UTILIZED TO PREVENT FALLS:  Life alert? No  Use of a cane, walker or w/c? No  Grab bars in the bathroom? yes Shower chair or bench in shower? No  Elevated toilet seat or a handicapped toilet? No    TIMED UP AND GO:  Was the test performed? Yes .  Length of time to ambulate 10 feet: 9 sec.   GAIT:  Appearance of gait:  Gait slow and steady without the use of an assistive device. Patient did appear to lose balance when approaching doors.  Education: Fall risk prevention has been discussed.  Intervention(s) required? No   DME/home health order needed?  No    Depression Screen PHQ 2/9 Scores 08/23/2019 05/23/2019 08/22/2018 08/20/2017  PHQ - 2 Score 0 0 0 0  PHQ- 9 Score - - - -    Cognitive Function, not able to perform today due to hearing impairment      6CIT Screen 08/22/2018 08/20/2017  What Year? 0 points 0 points  What month? 0 points 0 points  What time? 0 points 0 points  Count back from 20 0 points 0 points  Months in reverse 0 points 0 points  Repeat phrase 0 points 0 points  Total  Score 0 0    Immunization History  Administered Date(s) Administered  . Fluad Quad(high Dose 65+) 12/14/2018  . Influenza, High Dose Seasonal PF 02/02/2017  . Influenza,inj,Quad PF,6+ Mos 01/16/2015  . Influenza-Unspecified 01/20/2016, 02/15/2018  . Pneumococcal Conjugate-13 07/05/2014  . Pneumococcal Polysaccharide-23 01/30/2016  . Pneumococcal-Unspecified 03/23/2000, 12/09/2004  . Td 01/19/2008  . Tdap 02/20/2018  . Zoster 07/28/2011  . Zoster Recombinat (Shingrix) 08/26/2017, 11/11/2017    Qualifies for Shingles Vaccine? Shingrix completed   Tdap: up to date   Flu Vaccine: up to date   Pneumococcal Vaccine: completed series   Covid-19 Vaccine: Completed vaccines, patient will bring in card at next visit. Unsure of dates   Screening Tests Health Maintenance  Topic Date Due  . COVID-19 Vaccine (1) Never done  . HEMOGLOBIN A1C  08/09/2019  . INFLUENZA VACCINE  10/22/2019  . FOOT EXAM  03/27/2020  . OPHTHALMOLOGY EXAM  08/20/2020  . TETANUS/TDAP  02/21/2028  . PNA vac Low Risk Adult  Completed   Cancer Screenings:  Colorectal Screening: no longer required   Lung Cancer Screening: (Low Dose CT Chest recommended if Age 57-80 years, 30 pack-year currently smoking OR have quit w/in 15years.) does not qualify.    Additional Screening:  Hepatitis C Screening: does not qualify  Dental Screening: Recommended annual dental exams for proper oral hygiene  Community Resource Referral:  CRR required this visit?  No        Plan:  I have personally reviewed and  addressed the Medicare Annual Wellness questionnaire and have noted the following in the patient's chart:  A. Medical and social history B. Use of alcohol, tobacco or illicit drugs  C. Current medications and supplements D. Functional ability and status E.  Nutritional status F.  Physical activity G. Advance directives H. List of other physicians I.  Hospitalizations, surgeries, and ER visits in previous 12  months J.  [place] such as hearing and vision if needed, cognitive and depression L. Referrals and appointments   In addition, I have reviewed and discussed with patient certain preventive protocols, quality metrics, and best practice recommendations. A written personalized care plan for preventive services as well as general preventive health recommendations were provided to patient.   Signed,   Bevelyn Ngo, LPN  [id number] Nurse Health Advisor  Nurse Notes: none

## 2019-08-23 NOTE — Assessment & Plan Note (Addendum)
Chronic, ongoing.  Continue current medication regimen and adjust as needed.  Lipid panel today, recent LDL 31. °

## 2019-08-23 NOTE — Assessment & Plan Note (Signed)
Refer to morbid obesity plan, continue focus on healthy diet and exercise regimen. °

## 2019-08-23 NOTE — Assessment & Plan Note (Signed)
Chronic, ongoing with poor control.  Followed by endocrinology with recent A1C 8.6%, January urine ALB 150 and A:C >300, continue Olmesartan for kidney protection.  Continue current collaboration and medication regimen as prescribed by them.  Recommend focus on modest weight loss and diet changes.  Review upcoming endocrinology note once available.  Continue collaboration with CCM team to assist in ongoing education about diabetes.  Return in 3 months.

## 2019-08-23 NOTE — Chronic Care Management (AMB) (Signed)
Chronic Care Management   Follow Up Note   08/23/2019 Name: Justin Griffith MRN: [id number] DOB: 1940-11-06  Referred by: Justin Lick, NP Reason for referral : Chronic Care Management (Medication Management)   Justin Griffith is a 79 y.o. year old male who is a primary care patient of [name], Justin Faster, NP. The CCM team was consulted for assistance with chronic disease management and care coordination needs.    Met with patient face to face for medication questions.   Review of patient status, including review of consultants reports, relevant laboratory and other test results, and collaboration with appropriate care team members and the patient's provider was performed as part of comprehensive patient evaluation and provision of chronic care management services.    SDOH (Social Determinants of Health) assessments performed: No See Care Plan activities for detailed interventions related to [name])     Outpatient Encounter Medications as of 08/23/2019  Medication Sig Note  . aspirin EC 81 MG tablet Take 81 mg by mouth daily.    . carvedilol (COREG) 25 MG tablet Take 1 tablet (25 mg total) by mouth 2 (two) times daily with a meal.   . cetirizine (ZYRTEC) 10 MG tablet Take 10 mg by mouth daily.   . cloNIDine (CATAPRES) 0.2 MG tablet Take 0.2 mg by mouth 2 (two) times daily.   . furosemide (LASIX) 20 MG tablet Take 2 tablets (40 mg total) by mouth daily.   Marland Kitchen gabapentin (NEURONTIN) 300 MG capsule Start out taking 1 capsule (300 MG) at bedtime by mouth and if tolerated may increase 2 capsules (600 MG) at bedtime.   Marland Kitchen olmesartan-hydrochlorothiazide (BENICAR HCT) 40-25 MG tablet Take 1 tablet by mouth daily.   Marland Kitchen acyclovir (ZOVIRAX) 400 MG tablet Take 400 mg by mouth 2 (two) times daily.   . clobetasol cream (TEMOVATE) 2.48 % Apply 1 application topically 2 (two) times daily. Apply to bilateral lower legs.   . insulin glargine (LANTUS) 100 UNIT/ML injection Inject 75 Units into the skin daily.   07/18/2019: 50 units  . insulin regular (NOVOLIN R) 100 units/mL injection Inject 30 Units into the skin 3 (three) times daily before meals.  01/25/2019: Taking PRN when sugar >150  . ipratropium-albuterol (DUONEB) 0.5-2.5 (3) MG/3ML SOLN Take 3 mLs by nebulization every 6 (six) hours. 02/21/2019: Taking QID   . metFORMIN (GLUCOPHAGE) 1000 MG tablet Take 1,000 mg by mouth 2 (two) times daily with a meal.  08/23/2019: Stopped taking due to diarrhea   . mupirocin ointment (BACTROBAN) 2 % Apply 1 application topically 2 (two) times daily. To open blisters bilateral legs.   . naproxen (NAPROSYN) 500 MG tablet Take 500 mg by mouth 2 (two) times daily with a meal. Arthritis 05/23/2019: PRN   . QVAR REDIHALER 40 MCG/ACT inhaler 1 puff 2 (two) times daily.  05/23/2019: Using PRN  . rosuvastatin (CRESTOR) 40 MG tablet Take 1 tablet (40 mg total) by mouth at bedtime. (Patient taking differently: Take 40 mg by mouth at bedtime. 20 mg once a day)   . tamsulosin (FLOMAX) 0.4 MG CAPS capsule Take 0.4 mg by mouth daily.   . vitamin B-12 (CYANOCOBALAMIN) 500 MCG tablet Take 1,000 mcg by mouth daily. Two at night   . Vitamin D, Cholecalciferol, 25 MCG (1000 UT) CAPS Take 50 mcg by mouth daily. 2 a night    No facility-administered encounter medications on file as of 08/23/2019.     Objective:   Goals Addressed  This Visit's Progress     Patient Stated   . PharmD "I don't know much about my diabetes" (pt-stated)       CARE PLAN ENTRY (see longtitudinal plan of care for additional care plan information)  Current Barriers:  . Diabetes: uncontrolled; most recent A1c 8.1%; [facility]in[facility] (Dr. Honor Griffith) o Worries today about constant sleepiness. Wonders if it is related to his medications . Current antihyperglycemic regimen: Metformin 1000 mg BID, Lantus 50 units daily (self decreased from 80 units); reports he has not used rapid acting insulin in ~2 months ? Hx  significant GI upset and intolerance with Victoza and Trulicity, denies ever having tried Ozempic before ? Hx allergic rash to [place] ? Notes that he was previously on Humalog and it "made my ankles swell" ? Notes that Lantus "works better" than Toujeo for him ? Previous visit Dr. Honor Griffith noted "[name] =250 which is consistent with an a1c ~5.9. A1c may not be accurate. Will review at next visit when he has new meter" . Cardiovascular risk reduction: follows w/ Justin Griffith.  o Current hypertensive regimen: carvedilol 25 mg BID, clonidine 0.2 BID, olmesartan/HCTZ 40/25 mg daily, furosemide 40 mg daily  o Current hyperlipidemia regimen: rosuvastatin 20 mg daily (taking 1/2 tablet of 40 mg); LDL at goal <70 . Peripheral neuropathy: 300 mg QPM . COPD: follows w/ Dr. Raul Griffith; on Duonebs QID. Takes Qvar PRN  Pharmacist Clinical Goal(s):  Marland Kitchen Over the next 90 days, patient will work with PharmD and primary care provider to address optimized medication management  Interventions: . Reviewed CNS effects of clonidine. Could be contributing to sedation. Reviewed risk of rebound HTN if he were to stop completely, as patient has hx of self-adjustment and discontinuation of medications. Discussed w/ PCP. Will trial reduction of clonidine to 0.1 mg BID. Start spironolactone 12.5 mg daily. Recommend BMP in ~5-7 days.  . Discussed need to monitor BP at home. [facility] will [facility] to see if he can get a BP machine.  Patient Self Care Activities:  . Patient will check blood glucose daily , document, and provide at future appointments . Patient will take medications as prescribed . Patient will report any questions or concerns to provider   Please see past updates related to this goal by clicking on the "Past Updates" button in the selected goal          Plan:  - Will outreach as previously scheduled  Catie Darnelle Maffucci, PharmD, [place] [phone]

## 2019-08-24 LAB — LIPID PANEL W/O CHOL/HDL RATIO
Cholesterol, Total: 113 mg/dL (ref 100–199)
HDL: 24 mg/dL — ABNORMAL LOW (ref >39)
LDL Chol Calc (NIH): 59 mg/dL (ref 0–99)
Triglycerides: 174 mg/dL — ABNORMAL HIGH (ref 0–149)
VLDL Cholesterol Cal: 30 mg/dL (ref 5–40)

## 2019-08-24 LAB — VITAMIN B12: Vitamin B-12: 826 pg/mL (ref 232–1245)

## 2019-08-24 NOTE — Progress Notes (Signed)
Good morning, please let Justin Griffith know his labs have returned.  B12 level is much improving with him taking daily supplement, went from 194 to now 826.  Continue supplement daily.  Cholesterol levels continue to show LDL below goal for stroke prevention, with LDL 59.  Triglycerides remain a little elevated, recommend continuing Crestor and watching diet.  If any questions let me know.  Have a great day!!

## 2019-09-12 ENCOUNTER — Ambulatory Visit: Payer: Self-pay | Admitting: Pharmacist

## 2019-09-12 ENCOUNTER — Telehealth: Payer: Medicare Other

## 2019-09-12 NOTE — Chronic Care Management (AMB) (Signed)
  Chronic Care Management   Note  09/12/2019 Name: ORLYN ODONOGHUE MRN: [id number] DOB: 1940/05/21  MAZIN EMMA is a 79 y.o. year old male who is a primary care patient of [name], [name], NP. The CCM team was consulted for assistance with chronic disease management and care coordination needs.    Attempted to contact patient for medication management review. Left HIPAA compliant message for patient to return my call at their convenience.   Plan: - Will collaborate with Care Guide to outreach to schedule follow up with me   Catie Darnelle Maffucci, PharmD, [place] [phone]

## 2019-09-13 ENCOUNTER — Telehealth: Payer: Self-pay | Admitting: Nurse Practitioner

## 2019-09-13 NOTE — Chronic Care Management (AMB) (Signed)
  Care Management   Note  09/13/2019 Name: WILBUR OAKLAND MRN: [id number] DOB: 07-03-1940  KOLBI ALTADONNA is a 79 y.o. year old male who is a primary care patient of [name], NP and is actively engaged with the care management team. I reached out to [name] by phone today to assist with re-scheduling a follow up visit with the Pharmacist  Follow up plan: Telephone appointment with care management team member scheduled for:10/25/2019  Noreene Larsson, [place], [place], [place] [id number] Direct Dial: [phone] [email or web address]: [email or web address]

## 2019-09-13 NOTE — Progress Notes (Signed)
Pt has been r/s for 10/25/2019

## 2019-09-27 DIAGNOSIS — E1159 Type 2 diabetes mellitus with other circulatory complications: Secondary | ICD-10-CM | POA: Diagnosis not present

## 2019-09-27 DIAGNOSIS — I152 Hypertension secondary to endocrine disorders: Secondary | ICD-10-CM | POA: Diagnosis not present

## 2019-09-27 DIAGNOSIS — E1142 Type 2 diabetes mellitus with diabetic polyneuropathy: Secondary | ICD-10-CM | POA: Diagnosis not present

## 2019-09-27 DIAGNOSIS — E785 Hyperlipidemia, unspecified: Secondary | ICD-10-CM | POA: Diagnosis not present

## 2019-09-27 DIAGNOSIS — Z794 Long term (current) use of insulin: Secondary | ICD-10-CM | POA: Diagnosis not present

## 2019-09-27 DIAGNOSIS — E1169 Type 2 diabetes mellitus with other specified complication: Secondary | ICD-10-CM | POA: Diagnosis not present

## 2019-10-04 DIAGNOSIS — I10 Essential (primary) hypertension: Secondary | ICD-10-CM | POA: Diagnosis not present

## 2019-10-04 DIAGNOSIS — E782 Mixed hyperlipidemia: Secondary | ICD-10-CM | POA: Diagnosis not present

## 2019-10-04 DIAGNOSIS — Z6841 Body Mass Index (BMI) 40.0 and over, adult: Secondary | ICD-10-CM | POA: Diagnosis not present

## 2019-10-04 DIAGNOSIS — I739 Peripheral vascular disease, unspecified: Secondary | ICD-10-CM | POA: Diagnosis not present

## 2019-10-04 DIAGNOSIS — E1142 Type 2 diabetes mellitus with diabetic polyneuropathy: Secondary | ICD-10-CM | POA: Diagnosis not present

## 2019-10-04 DIAGNOSIS — G4733 Obstructive sleep apnea (adult) (pediatric): Secondary | ICD-10-CM | POA: Diagnosis not present

## 2019-10-04 DIAGNOSIS — I2510 Atherosclerotic heart disease of native coronary artery without angina pectoris: Secondary | ICD-10-CM | POA: Diagnosis not present

## 2019-10-04 DIAGNOSIS — R600 Localized edema: Secondary | ICD-10-CM | POA: Diagnosis not present

## 2019-10-04 DIAGNOSIS — J449 Chronic obstructive pulmonary disease, unspecified: Secondary | ICD-10-CM | POA: Diagnosis not present

## 2019-10-04 DIAGNOSIS — Z794 Long term (current) use of insulin: Secondary | ICD-10-CM | POA: Diagnosis not present

## 2019-10-04 DIAGNOSIS — R0602 Shortness of breath: Secondary | ICD-10-CM | POA: Diagnosis not present

## 2019-10-06 ENCOUNTER — Telehealth: Payer: Medicare Other | Admitting: General Practice

## 2019-10-06 ENCOUNTER — Ambulatory Visit (INDEPENDENT_AMBULATORY_CARE_PROVIDER_SITE_OTHER): Payer: Medicare Other | Admitting: General Practice

## 2019-10-06 DIAGNOSIS — E1165 Type 2 diabetes mellitus with hyperglycemia: Secondary | ICD-10-CM

## 2019-10-06 DIAGNOSIS — E1159 Type 2 diabetes mellitus with other circulatory complications: Secondary | ICD-10-CM

## 2019-10-06 DIAGNOSIS — E1169 Type 2 diabetes mellitus with other specified complication: Secondary | ICD-10-CM

## 2019-10-06 DIAGNOSIS — Z794 Long term (current) use of insulin: Secondary | ICD-10-CM | POA: Diagnosis not present

## 2019-10-06 DIAGNOSIS — E785 Hyperlipidemia, unspecified: Secondary | ICD-10-CM | POA: Diagnosis not present

## 2019-10-06 DIAGNOSIS — I10 Essential (primary) hypertension: Secondary | ICD-10-CM

## 2019-10-06 DIAGNOSIS — I739 Peripheral vascular disease, unspecified: Secondary | ICD-10-CM

## 2019-10-06 DIAGNOSIS — J449 Chronic obstructive pulmonary disease, unspecified: Secondary | ICD-10-CM

## 2019-10-06 DIAGNOSIS — I152 Hypertension secondary to endocrine disorders: Secondary | ICD-10-CM

## 2019-10-06 NOTE — Chronic Care Management (AMB) (Signed)
Chronic Care Management   Follow Up Note   10/06/2019 Name: Justin Griffith MRN: [id number] DOB: 1940/10/14  Referred by: [name], NP Reason for referral : Chronic Care Management (RNCM Follow up call: Chronic Disease Management and Care Coordination Needs)   Justin Griffith is a 79 y.o. year old male who is a primary care patient of [name], [name], NP. The CCM team was consulted for assistance with chronic disease management and care coordination needs.    Review of patient status, including review of consultants reports, relevant laboratory and other test results, and collaboration with appropriate care team members and the patient's provider was performed as part of comprehensive patient evaluation and provision of chronic care management services.    SDOH (Social Determinants of Health) assessments performed: Yes See Care Plan activities for detailed interventions related to [facility])     Outpatient Encounter Medications as of 10/06/2019  Medication Sig Note  . acyclovir (ZOVIRAX) 400 MG tablet Take 400 mg by mouth 2 (two) times daily.   Marland Kitchen aspirin EC 81 MG tablet Take 81 mg by mouth daily.    . carvedilol (COREG) 25 MG tablet Take 1 tablet (25 mg total) by mouth 2 (two) times daily with a meal.   . cetirizine (ZYRTEC) 10 MG tablet Take 10 mg by mouth daily.   . clobetasol cream (TEMOVATE) 6.29 % Apply 1 application topically 2 (two) times daily. Apply to bilateral lower legs.   . cloNIDine (CATAPRES) 0.2 MG tablet Take 0.2 mg by mouth 2 (two) times daily.   . furosemide (LASIX) 20 MG tablet Take 2 tablets (40 mg total) by mouth daily.   Marland Kitchen gabapentin (NEURONTIN) 300 MG capsule Start out taking 1 capsule (300 MG) at bedtime by mouth and if tolerated may increase 2 capsules (600 MG) at bedtime.   . insulin glargine (LANTUS) 100 UNIT/ML injection Inject 75 Units into the skin daily.  07/18/2019: 50 units  . ipratropium-albuterol (DUONEB) 0.5-2.5 (3) MG/3ML SOLN Take 3 mLs by  nebulization every 6 (six) hours. 02/21/2019: Taking QID   . mupirocin ointment (BACTROBAN) 2 % Apply 1 application topically 2 (two) times daily. To open blisters bilateral legs.   . naproxen (NAPROSYN) 500 MG tablet Take 500 mg by mouth 2 (two) times daily with a meal. Arthritis 05/23/2019: PRN   . olmesartan-hydrochlorothiazide (BENICAR HCT) 40-25 MG tablet Take 1 tablet by mouth daily.   Marland Kitchen QVAR REDIHALER 40 MCG/ACT inhaler 1 puff 2 (two) times daily.  05/23/2019: Using PRN  . rosuvastatin (CRESTOR) 40 MG tablet Take 1 tablet (40 mg total) by mouth at bedtime. (Patient taking differently: Take 40 mg by mouth at bedtime. 20 mg once a day)   . tamsulosin (FLOMAX) 0.4 MG CAPS capsule Take 0.4 mg by mouth daily.   . vitamin B-12 (CYANOCOBALAMIN) 500 MCG tablet Take 1,000 mcg by mouth daily. Two at night   . Vitamin D, Cholecalciferol, 25 MCG (1000 UT) CAPS Take 50 mcg by mouth daily. 2 a night    No facility-administered encounter medications on file as of 10/06/2019.     Objective:  BP Readings from Last 3 Encounters:  08/23/19 121/73  08/23/19 121/73  07/14/19 (!) 141/78   At cardiologist office on 10-04-2019: 128/82  Goals Addressed              This Visit's Progress   .  RNCM: Pt- " I am concerned about the swelling in my legs" (pt-stated)  CARE PLAN ENTRY (see longtitudinal plan of care for additional care plan information)  Current Barriers:  . Chronic Disease Management support, education, and care coordination needs related to HTN, HLD, COPD, and DMII  Clinical Goal(s) related to HTN, HLD, COPD, and DMII:  Over the next 120 days, patient will:  . Work with the care management team to address educational, disease management, and care coordination needs  . Begin or continue self health monitoring activities as directed today Measure and record cbg (blood glucose) 3-4 times daily and Measure and record blood pressure 3 times per week . Call provider office for new or  worsened signs and symptoms Blood glucose findings outside established parameters, Blood pressure findings outside established parameters, Weight outside established parameters, Oxygen saturation lower than established parameter, Shortness of breath, and New or worsened symptom related to Swelling in legs, and other changes in condition  . Call care management team with questions or concerns . Verbalize basic understanding of patient centered plan of care established today  Interventions related to HTN, HLD, COPD, and DMII:  . Evaluation of current treatment plans and patient's adherence to plan as established by provider.  Per the patient he has lost down to 302.  He has cut out ice cream and potato chips and eating one "good meal" a day. This is usually at lunch or supper.  The patient states that he has decreased his Lantus from 80 units to 50 units because the Lantus at 80 units was too much. Blood sugars are running 100-140. In the office today the patient states he has gain some weight back because of the holidays but he is back on his regimen and it will come back off. 10-06-2019: The patient is down to 295. Has noticed that the swelling in his legs and feet have gone down considerably . Assessed patient understanding of disease states-patient is ready to make positive changes to improve his health and well being. The patient has made positive lifestyle changes to help his health and well being. Praised for weight loss and changing his dietary habits.  . Assessed patient's education and care coordination needs-the patient sees several different specialist and also the [name]. The patient has several healthcare practices he does at home. Education and support given.  . Evaluation of next appointment with pcp is on June the 2nd.  The wellness nurse will also be at the appointment. 10-06-2019: The next appointment with pcp is on 11-28-2019.  The patient may need a sooner appointment due to new onset of "shaking  and losing balance" will discuss with the pcp.  . Evaluation of swelling in bilateral legs and feet. The patient verbalized that his swelling has gone down in his feet and legs considerably. He still has more in his left leg/foot  than right leg/foot. The patient has been unsuccessful at finding ted hose that work for him. Research done and there are 3XL size available for purchase. The RNCM will  have a pair of 3XL [employer]on Anti-embolism cap knee high inspection toe stockings 18 mmhg- white for the patient to try for help with reducing swelling further in his legs. Information for purchase of ted hose if this works for the patient is: http://young.[email or web address] SKU#561- $4.39 a pair- does not include shipping and handling. Will have this information for the patient if he chooses to order more of the ted stockings. Saw the patient face to face in the office today. Took the ted hose to the patient. The patient feels these  will work well for his legs. The patients legs have decreased swelling.  The left is more swollen than the right. The right appears to be normal and back to baseline. 10-06-2019: The patient states that both legs have no swelling in them at this time. The cardiologist discontinued his Lasix this week. Reviewed the notes in the Epic system with the patient and will have the pharmacist follow up for reinforcement.  The patient thought he had to cut the Coreg in half. The patient says he takes so much medication he can not keep things straight. Reviewed the notes from the cardiologist with the patient. The patient verbalized understanding. The patient has not used the ted hose but has them if needed.  Will have additional testing in August per the patient and follow up with the cardiologist after testing is completed.  . Provided disease specific education to patient-Heart Healty/ADA diet.  The patient has had significant weight loss. Praised for positive dietary changes to promote health and well  being. Nash Dimmer with appropriate clinical care team members regarding patient needs.  Currently working with the CCM pharmacist. Will collaborate with the pcp and pharmacist concerning patients confusion with recent medication changes. The patient verbalized understanding at the end of the call but would like team support as the patient easily gets confused with his medications and how to take them.  . Evaluation of new complaint of "shaking and losing balance".  The patient verbalized he shakes so much he can hardly sign his name. He said when he is standing he is losing his balance and his wife even said he looks like he is "swaying".  The patient states he does not know what to do about it. The patient also says that his right leg at the ankle sometimes has a shooting sensation in it and it is "terrible".  Says he can not tell that the gabapentin is helping. The patient did say he has not increased the dose to 2. Told the patient to try this as directed by [facility] and see if this helped. Did tell the patient RNCM would collaborate with the pcp and let her know of new findings. Also that she may want to see the patient sooner than his September appointment. The patient verbalized understanding.   Patient Self Care Activities related to HTN, HLD, COPD, and DMII:  . Patient is unable to independently self-manage chronic health conditions  Please see past updates related to this goal by clicking on the "Past Updates" button in the selected goal          Plan:   The care management team will reach out to the patient again over the next 60 to 90 days.    Noreene Larsson RN, MSN, [facility] Mobile: [phone]

## 2019-10-06 NOTE — Patient Instructions (Signed)
Visit Information  Goals Addressed              This Visit's Progress   .  RNCM: Pt- " I am concerned about the swelling in my legs" (pt-stated)        CARE PLAN ENTRY (see longtitudinal plan of care for additional care plan information)  Current Barriers:  . Chronic Disease Management support, education, and care coordination needs related to HTN, HLD, COPD, and DMII  Clinical Goal(s) related to HTN, HLD, COPD, and DMII:  Over the next 120 days, patient will:  . Work with the care management team to address educational, disease management, and care coordination needs  . Begin or continue self health monitoring activities as directed today Measure and record cbg (blood glucose) 3-4 times daily and Measure and record blood pressure 3 times per week . Call provider office for new or worsened signs and symptoms Blood glucose findings outside established parameters, Blood pressure findings outside established parameters, Weight outside established parameters, Oxygen saturation lower than established parameter, Shortness of breath, and New or worsened symptom related to Swelling in legs, and other changes in condition  . Call care management team with questions or concerns . Verbalize basic understanding of patient centered plan of care established today  Interventions related to HTN, HLD, COPD, and DMII:  . Evaluation of current treatment plans and patient's adherence to plan as established by provider.  Per the patient he has lost down to 302.  He has cut out ice cream and potato chips and eating one "good meal" a day. This is usually at lunch or supper.  The patient states that he has decreased his Lantus from 80 units to 50 units because the Lantus at 80 units was too much. Blood sugars are running 100-140. In the office today the patient states he has gain some weight back because of the holidays but he is back on his regimen and it will come back off. 10-06-2019: The patient is down to 295.  Has noticed that the swelling in his legs and feet have gone down considerably . Assessed patient understanding of disease states-patient is ready to make positive changes to improve his health and well being. The patient has made positive lifestyle changes to help his health and well being. Praised for weight loss and changing his dietary habits.  . Assessed patient's education and care coordination needs-the patient sees several different specialist and also the [name]. The patient has several healthcare practices he does at home. Education and support given.  . Evaluation of next appointment with pcp is on June the 2nd.  The wellness nurse will also be at the appointment. 10-06-2019: The next appointment with pcp is on 11-28-2019.  The patient may need a sooner appointment due to new onset of "shaking and losing balance" will discuss with the pcp.  . Evaluation of swelling in bilateral legs and feet. The patient verbalized that his swelling has gone down in his feet and legs considerably. He still has more in his left leg/foot  than right leg/foot. The patient has been unsuccessful at finding ted hose that work for him. Research done and there are 3XL size available for purchase. The RNCM will  have a pair of 3XL Carolon Anti-embolism cap knee high inspection toe stockings 18 mmhg- white for the patient to try for help with reducing swelling further in his legs. Information for purchase of ted hose if this works for the patient is: http://young.[email or web address] SKU#561- $4.39 a pair-  does not include shipping and handling. Will have this information for the patient if he chooses to order more of the ted stockings. Saw the patient face to face in the office today. Took the ted hose to the patient. The patient feels these will work well for his legs. The patients legs have decreased swelling.  The left is more swollen than the right. The right appears to be normal and back to baseline. 10-06-2019: The patient states that both  legs have no swelling in them at this time. The cardiologist discontinued his Lasix this week. Reviewed the notes in the Epic system with the patient and will have the pharmacist follow up for reinforcement.  The patient thought he had to cut the Coreg in half. The patient says he takes so much medication he can not keep things straight. Reviewed the notes from the cardiologist with the patient. The patient verbalized understanding. The patient has not used the ted hose but has them if needed.  Will have additional testing in August per the patient and follow up with the cardiologist after testing is completed.  . Provided disease specific education to patient-Heart Healty/ADA diet.  The patient has had significant weight loss. Praised for positive dietary changes to promote health and well being. Nash Dimmer with appropriate clinical care team members regarding patient needs.  Currently working with the CCM pharmacist. Will collaborate with the pcp and pharmacist concerning patients confusion with recent medication changes. The patient verbalized understanding at the end of the call but would like team support as the patient easily gets confused with his medications and how to take them.  . Evaluation of new complaint of "shaking and losing balance".  The patient verbalized he shakes so much he can hardly sign his name. He said when he is standing he is losing his balance and his wife even said he looks like he is "swaying".  The patient states he does not know what to do about it. The patient also says that his right leg at the ankle sometimes has a shooting sensation in it and it is "terrible".  Says he can not tell that the gabapentin is helping. The patient did say he has not increased the dose to 2. Told the patient to try this as directed by [facility] and see if this helped. Did tell the patient RNCM would collaborate with the pcp and let her know of new findings. Also that she may want to see the patient  sooner than his September appointment. The patient verbalized understanding.   Patient Self Care Activities related to HTN, HLD, COPD, and DMII:  . Patient is unable to independently self-manage chronic health conditions  Please see past updates related to this goal by clicking on the "Past Updates" button in the selected goal         Patient verbalizes understanding of instructions provided today.   The care management team will reach out to the patient again over the next 60 to 90 days.   Noreene Larsson RN, MSN, [facility] Mobile: [phone]

## 2019-10-16 ENCOUNTER — Encounter: Payer: Self-pay | Admitting: Nurse Practitioner

## 2019-10-16 ENCOUNTER — Other Ambulatory Visit: Payer: Self-pay

## 2019-10-16 ENCOUNTER — Ambulatory Visit (INDEPENDENT_AMBULATORY_CARE_PROVIDER_SITE_OTHER): Payer: Medicare Other | Admitting: Nurse Practitioner

## 2019-10-16 VITALS — BP 175/72 | HR 54 | Temp 98.0°F | Wt 303.6 lb

## 2019-10-16 DIAGNOSIS — L0291 Cutaneous abscess, unspecified: Secondary | ICD-10-CM | POA: Insufficient documentation

## 2019-10-16 MED ORDER — DOXYCYCLINE HYCLATE 100 MG PO TABS: 100.0000 mg | ORAL_TABLET | Freq: Two times a day (BID) | ORAL | 0 refills | Status: DC

## 2019-10-16 NOTE — Progress Notes (Signed)
BP (!) 175/72   Pulse 54   Temp 98 F (36.7 C) (Oral)   Wt (!) 303 lb 9.6 oz (137.7 kg)   SpO2 95%   BMI 42.34 kg/m    Subjective:    Patient ID: Justin Griffith, male    DOB: [birth year], 79 y.o.   MRN: [id number]  HPI: Justin Griffith is a 79 y.o. male presenting with a rash  Chief Complaint  Patient presents with  . Rash    pt states he has a place in his groin that came up about a week ago. States it started small like a pimple and has grown larger in size. Has used Clotrimazole and Hydrocortisone with no relief. States it burns and is painful.    RASH Reports he has a lesion under his right lower abdomen.  It started at the size of a pimple and now it is much bigger and is very painful.  Reports he gets "diabetic crotch" and has tried antifungal cream on the lesion.  Reports he also gets small dots on his bottom from his diabetes but this new issue is something that is completely different than his normal ailments secondary to diabetes.  Duration:  weeks  Location: right lower abdomen Itching: no Burning: yes Redness: yes Oozing: no Scaling: no Blisters: no Painful: yes Fevers: no Change in detergents/soaps/personal care products: no; uses [place] and Red Spice  Recent illness: no Recent travel:no History of same: no Context: bigger Alleviating factors: nothing Treatments attempted: hydrocortisone, clotrimazole Shortness of breath: no  Throat/tongue swelling: no Myalgias/arthralgias: no  Allergies  Allergen Reactions  . Farxiga [Dapagliflozin] Rash  . Jardiance [Empagliflozin] Rash   Outpatient Encounter Medications as of 10/16/2019  Medication Sig Note  . acyclovir (ZOVIRAX) 400 MG tablet Take 400 mg by mouth 2 (two) times daily.   Marland Kitchen aspirin EC 81 MG tablet Take 81 mg by mouth daily.    . carvedilol (COREG) 25 MG tablet Take 1 tablet (25 mg total) by mouth 2 (two) times daily with a meal.   . cetirizine (ZYRTEC) 10 MG tablet Take 10 mg by mouth daily.     . clobetasol cream (TEMOVATE) 4.40 % Apply 1 application topically 2 (two) times daily. Apply to bilateral lower legs.   . furosemide (LASIX) 20 MG tablet Take 2 tablets (40 mg total) by mouth daily.   Marland Kitchen gabapentin (NEURONTIN) 300 MG capsule Start out taking 1 capsule (300 MG) at bedtime by mouth and if tolerated may increase 2 capsules (600 MG) at bedtime.   . insulin aspart (NOVOLOG) 100 UNIT/ML injection Inject into the skin 3 (three) times daily before meals.   . insulin glargine (LANTUS) 100 UNIT/ML injection Inject 80 Units into the skin daily.  07/18/2019: 50 units  . ipratropium-albuterol (DUONEB) 0.5-2.5 (3) MG/3ML SOLN Take 3 mLs by nebulization every 6 (six) hours. 02/21/2019: Taking QID   . mupirocin ointment (BACTROBAN) 2 % Apply 1 application topically 2 (two) times daily. To open blisters bilateral legs.   Marland Kitchen QVAR REDIHALER 40 MCG/ACT inhaler 1 puff 2 (two) times daily.  05/23/2019: Using PRN  . rosuvastatin (CRESTOR) 40 MG tablet Take 1 tablet (40 mg total) by mouth at bedtime. (Patient taking differently: Take 40 mg by mouth at bedtime. 20 mg once a day)   . vitamin B-12 (CYANOCOBALAMIN) 500 MCG tablet Take 1,000 mcg by mouth daily. Two at night   . Vitamin D, Cholecalciferol, 25 MCG (1000 UT) CAPS Take 50 mcg by  mouth daily. 2 a night   . cloNIDine (CATAPRES) 0.2 MG tablet Take 0.2 mg by mouth 2 (two) times daily. (Patient not taking: Reported on 10/16/2019)   . naproxen (NAPROSYN) 500 MG tablet Take 500 mg by mouth 2 (two) times daily with a meal. Arthritis 05/23/2019: PRN   . olmesartan-hydrochlorothiazide (BENICAR HCT) 40-25 MG tablet Take 1 tablet by mouth daily. (Patient not taking: Reported on 10/16/2019)   . [DISCONTINUED] tamsulosin (FLOMAX) 0.4 MG CAPS capsule Take 0.4 mg by mouth daily.    No facility-administered encounter medications on file as of 10/16/2019.   Patient Active Problem List   Diagnosis Date Noted  . Pain in both lower extremities 07/14/2019  . Peripheral  vascular disease with stasis dermatitis 05/23/2019  . B12 deficiency 04/23/2019  . Transient alteration of awareness 04/21/2019  . Constipation 04/21/2019  . Tremor 11/14/2018  . Morbid obesity ([place]) 09/07/2017  . COPD (chronic obstructive pulmonary disease) ([place]) 04/29/2017  . Intertrigo 02/02/2017  . BPH (benign prostatic hyperplasia) 07/23/2016  . Advanced care planning/counseling discussion 07/23/2016  . Eosinophilic esophagitis 08/65/7846  . PAD (peripheral artery disease) ([place]) 01/08/2016  . Gastroesophageal reflux disease without esophagitis 01/06/2016  . Hyperlipidemia associated with type 2 diabetes mellitus ([place]) 01/16/2015  . Sleep apnea 01/16/2015  . BMI 40.0-44.9, adult ([place]) 01/16/2015  . Hypertension associated with diabetes ([place]) 10/11/2014  . Type 2 diabetes mellitus with proteinuria ([place]) 10/11/2014  . Back pain 10/11/2014  . History of colon cancer    Past Medical History:  Diagnosis Date  . Arthritis   . Asthma 2000  . Back pain   . Colon polyp 2012  . COPD (chronic obstructive pulmonary disease) ([place])   . Diabetes mellitus without complication ([place])   . Emphysema of lung ([place])   . Heart murmur   . Hernia 2000  . Hyperlipidemia   . Hypertension   . Personal history of malignant neoplasm of large intestine   . Personal history of tobacco use, presenting hazards to health   . Sleep apnea   . Special screening for malignant neoplasms, colon    Relevant past medical, surgical, family and social history reviewed and updated as indicated. Interim medical history since our last visit reviewed.  Review of Systems  Constitutional: Negative.  Negative for activity change, appetite change, chills, fatigue and fever.  HENT: Negative.  Negative for trouble swallowing.   Respiratory: Negative.  Negative for shortness of breath.   Musculoskeletal: Negative.  Negative for arthralgias and myalgias.  Skin: Positive for wound. Negative for color change, pallor and rash.   Neurological: Negative.   Psychiatric/Behavioral: Negative.     Per HPI unless specifically indicated above     Objective:    BP (!) 175/72   Pulse 54   Temp 98 F (36.7 C) (Oral)   Wt (!) 303 lb 9.6 oz (137.7 kg)   SpO2 95%   BMI 42.34 kg/m   Wt Readings from Last 3 Encounters:  10/16/19 (!) 303 lb 9.6 oz (137.7 kg)  10/06/19 295 lb (133.8 kg)  08/23/19 299 lb (135.6 kg)    Physical Exam Vitals and nursing note reviewed.  Constitutional:      General: He is not in acute distress.    Appearance: Normal appearance.  HENT:     Mouth/Throat:     Mouth: Mucous membranes are moist.     Pharynx: Oropharynx is clear.  Pulmonary:     Effort: Pulmonary effort is normal. No respiratory distress.  Musculoskeletal:  General: Normal range of motion.  Skin:    General: Skin is warm and dry.     Coloration: Skin is not jaundiced or pale.     Findings: Abscess (quarter-sized abscess to right lower pannus; dark red/purple in color) present.  Neurological:     General: No focal deficit present.     Mental Status: He is alert and oriented to person, place, and time.     Motor: No weakness.     Gait: Gait normal.  Psychiatric:        Mood and Affect: Mood normal.        Behavior: Behavior normal.        Thought Content: Thought content normal.        Judgment: Judgment normal.       Assessment & Plan:   Problem List Items Addressed This Visit    None    Visit Diagnoses    Abscess    -  Primary       Skin Procedure  Procedure: Informed consent given.  Sterile prep of area.  Area infiltrated with lidocaine with epinephrine.  Using a surgical blade, straight 1 cm incision used to enter abscess.  See details below.   Diagnosis:   ICD-10-CM   1. Abscess  L02.91     Lesion Location/Size: right lower pannus; 2.5 cm x 3 cm Provider: Carnella Guadalajara FNP-C with assistance from Merrie Roof, PA-C Consent:  Risks, benefits, and alternative treatments discussed and all  questions were answered.  Patient elected to proceed and verbal consent obtained.  Description: Area prepped and draped using semi-sterile technique. Area locally anesthetized using 5 cc's of lidocaine 2% with epi.  Abscess cavity entered with 1 cm incision and moderate amount of serosanguinous material expressed.  Hemostasis achieved without intervention. No wound closure indicated; wound edges well-approximated at end of procedure.    Post Procedure Instructions: Wound care instructions discussed and patient was instructed to keep area clean and dry.  Signs and symptoms of infection discussed, patient agrees to contact the office ASAP should they occur.  Dressing change recommended daily. Follow Up: 3 days or sooner if complications develop   Follow up plan: Return in about 3 days (around 10/19/2019) for abscess follow up.

## 2019-10-16 NOTE — Patient Instructions (Signed)
Skin Abscess  A skin abscess is an infected area on or under your skin that contains a collection of pus and other material. An abscess may also be called a furuncle, carbuncle, or boil. An abscess can occur in or on almost any part of your body. Some abscesses break open (rupture) on their own. Most continue to get worse unless they are treated. The infection can spread deeper into the body and eventually into your blood, which can make you feel ill. Treatment usually involves draining the abscess. What are the causes? An abscess occurs when germs, like bacteria, pass through your skin and cause an infection. This may be caused by:  A scrape or cut on your skin.  A puncture wound through your skin, including a needle injection or insect bite.  Blocked oil or sweat glands.  Blocked and infected hair follicles.  A cyst that forms beneath your skin (sebaceous cyst) and becomes infected. What increases the risk? This condition is more likely to develop in people who:  Have a weak body defense system (immune system).  Have diabetes.  Have dry and irritated skin.  Get frequent injections or use illegal IV drugs.  Have a foreign body in a wound, such as a splinter.  Have problems with their lymph system or veins. What are the signs or symptoms? Symptoms of this condition include:  A painful, firm bump under the skin.  A bump with pus at the top. This may break through the skin and drain. Other symptoms include:  Redness surrounding the abscess site.  Warmth.  Swelling of the lymph nodes (glands) near the abscess.  Tenderness.  A sore on the skin. How is this diagnosed? This condition may be diagnosed based on:  A physical exam.  Your medical history.  A sample of pus. This may be used to find out what is causing the infection.  Blood tests.  Imaging tests, such as an ultrasound, CT scan, or MRI. How is this treated? A small abscess that drains on its own may  not need treatment. Treatment for larger abscesses may include:  Moist heat or heat pack applied to the area several times a day.  A procedure to drain the abscess (incision and drainage).  Antibiotic medicines. For a severe abscess, you may first get antibiotics through an IV and then change to antibiotics by mouth. Follow these instructions at home: Medicines   Take over-the-counter and prescription medicines only as told by your health care provider.  If you were prescribed an antibiotic medicine, take it as told by your health care provider. Do not stop taking the antibiotic even if you start to feel better. Abscess care   If you have an abscess that has not drained, apply heat to the affected area. Use the heat source that your health care provider recommends, such as a moist heat pack or a heating pad. ? Place a towel between your skin and the heat source. ? Leave the heat on for 20-30 minutes. ? Remove the heat if your skin turns bright red. This is especially important if you are unable to feel pain, heat, or cold. You may have a greater risk of getting burned.  Follow instructions from your health care provider about how to take care of your abscess. Make sure you: ? Cover the abscess with a bandage (dressing). ? Change your dressing or gauze as told by your health care provider. ? Wash your hands with soap and water before you change the   dressing or gauze. If soap and water are not available, use hand sanitizer.  Check your abscess every day for signs of a worsening infection. Check for: ? More redness, swelling, or pain. ? More fluid or blood. ? Warmth. ? More pus or a bad smell. General instructions  To avoid spreading the infection: ? Do not share personal care items, towels, or hot tubs with others. ? Avoid making skin contact with other people.  Keep all follow-up visits as told by your health care provider. This is important. Contact a health care provider if  you have:  More redness, swelling, or pain around your abscess.  More fluid or blood coming from your abscess.  Warm skin around your abscess.  More pus or a bad smell coming from your abscess.  A fever.  Muscle aches.  Chills or a general ill feeling. Get help right away if you:  Have severe pain.  See red streaks on your skin spreading away from the abscess. Summary  A skin abscess is an infected area on or under your skin that contains a collection of pus and other material.  A small abscess that drains on its own may not need treatment.  Treatment for larger abscesses may include having a procedure to drain the abscess and taking an antibiotic. This information is not intended to replace advice given to you by your health care provider. Make sure you discuss any questions you have with your health care provider. Document Revised: 06/30/2018 Document Reviewed: 04/22/2017 Elsevier Patient Education  2020 [employer] Inc.  

## 2019-10-16 NOTE — Assessment & Plan Note (Signed)
Acute, ongoing.  Incision and drainage of right lower pannus area today; see procedure note below.  Wound care discussed and s/s of worsening infection.  Given co morbidities including diabetes, will treat with 7-day course of doxycycline 100 mg bid to cover for MRSA.  Follow up later this week to monitor for improvement, sooner if s/s infection develop

## 2019-10-20 ENCOUNTER — Encounter: Payer: Self-pay | Admitting: Nurse Practitioner

## 2019-10-20 ENCOUNTER — Ambulatory Visit (INDEPENDENT_AMBULATORY_CARE_PROVIDER_SITE_OTHER): Payer: Medicare Other | Admitting: Nurse Practitioner

## 2019-10-20 ENCOUNTER — Other Ambulatory Visit: Payer: Self-pay

## 2019-10-20 VITALS — BP 146/82 | HR 51 | Temp 98.4°F | Wt 303.0 lb

## 2019-10-20 DIAGNOSIS — E1159 Type 2 diabetes mellitus with other circulatory complications: Secondary | ICD-10-CM | POA: Diagnosis not present

## 2019-10-20 DIAGNOSIS — N401 Enlarged prostate with lower urinary tract symptoms: Secondary | ICD-10-CM

## 2019-10-20 DIAGNOSIS — R350 Frequency of micturition: Secondary | ICD-10-CM | POA: Diagnosis not present

## 2019-10-20 DIAGNOSIS — I152 Hypertension secondary to endocrine disorders: Secondary | ICD-10-CM

## 2019-10-20 DIAGNOSIS — L0291 Cutaneous abscess, unspecified: Secondary | ICD-10-CM | POA: Diagnosis not present

## 2019-10-20 DIAGNOSIS — R3915 Urgency of urination: Secondary | ICD-10-CM | POA: Diagnosis not present

## 2019-10-20 DIAGNOSIS — I10 Essential (primary) hypertension: Secondary | ICD-10-CM

## 2019-10-20 LAB — URINALYSIS, ROUTINE W REFLEX MICROSCOPIC
Bilirubin, UA: NEGATIVE
Glucose, UA: NEGATIVE
Ketones, UA: NEGATIVE
Leukocytes,UA: NEGATIVE
Nitrite, UA: NEGATIVE
RBC, UA: NEGATIVE
Specific Gravity, UA: 1.015 (ref 1.005–1.030)
Urobilinogen, Ur: 0.2 mg/dL (ref 0.2–1.0)
pH, UA: 5.5 (ref 5.0–7.5)

## 2019-10-20 LAB — MICROSCOPIC EXAMINATION
Bacteria, UA: NONE SEEN
RBC, Urine: NONE SEEN /hpf (ref 0–2)
WBC, UA: NONE SEEN /hpf (ref 0–5)

## 2019-10-20 NOTE — Progress Notes (Signed)
BP (!) 146/82 (BP Location: Left Arm, Cuff Size: Normal)    Pulse 51    Temp 98.4 F (36.9 C) (Oral)    Wt (!) 303 lb (137.4 kg)    SpO2 93%    BMI 42.26 kg/m    Subjective:    Patient ID: Justin Griffith, male    DOB: 1940-07-13, 79 y.o.   MRN: [id number]  HPI: Justin Griffith is a 79 y.o. male presenting with wound check.  Also worried about his urination and possible reaction from blood pressure medication.  Chief Complaint  Patient presents with   Wound Check   Hypertension    pt states that one of his BP medications is making him drowsy but he states he does not know which one it is   Urinary Tract Infection    pt states he has been having a strong urine odor and urinary urgency for a while now   Bleeding/Bruising    pt states he has a bruise on left forearm, states he just barely bumped his arm on the door   WOUND CHECK Patient reports that he thinks his abscess is healing well.  It is no longer painful and does not seem to be draining much.  His wife helped him change the bandage.  He denies fevers, nausea/vomiting, change in appetite, diarrhea.  He has been taking the antibiotics as prescribed.  URINARY SYMPTOMS About 1 year of malodorous urine; also urinary frequency. Dysuria: yes Urinary frequency: yes Urgency: yes Small volume voids: no Symptom severity: mild Urinary incontinence: yes Foul odor: yes Hematuria: no Abdominal pain: no Back pain: no Suprapubic pain/pressure: no Flank pain: no Fever:  no Nausea/Vomiting: no Relief with cranberry juice: no Relief with pyridium: no Status: stable Previous urinary tract infection: no Recurrent urinary tract infection: no Sexual activity: not currently History of sexually transmitted disease: no Penile discharge: no Treatments attempted:  Not tried anything  Patient worried that he is so sleepy during the day - feels it is his blood pressure medication making him feel this way.  Does not know the names of his  blood pressure medication but worried that recently, a lot of changes have been made between the Cardiologist and PCP.  Also worried about bruising on his extremities when he bumps into something.   Allergies  Allergen Reactions   Farxiga [Dapagliflozin] Rash   Jardiance [Empagliflozin] Rash   Outpatient Encounter Medications as of 10/20/2019  Medication Sig Note   acyclovir (ZOVIRAX) 400 MG tablet Take 400 mg by mouth 2 (two) times daily.    aspirin EC 81 MG tablet Take 81 mg by mouth daily.     cetirizine (ZYRTEC) 10 MG tablet Take 10 mg by mouth daily.    clobetasol cream (TEMOVATE) 7.62 % Apply 1 application topically 2 (two) times daily. Apply to bilateral lower legs.    cloNIDine (CATAPRES) 0.2 MG tablet Take 0.2 mg by mouth 2 (two) times daily.     doxycycline (VIBRA-TABS) 100 MG tablet Take 1 tablet (100 mg total) by mouth 2 (two) times daily.    furosemide (LASIX) 20 MG tablet Take 2 tablets (40 mg total) by mouth daily.    gabapentin (NEURONTIN) 300 MG capsule Start out taking 1 capsule (300 MG) at bedtime by mouth and if tolerated may increase 2 capsules (600 MG) at bedtime.    insulin glargine (LANTUS) 100 UNIT/ML injection Inject 80 Units into the skin daily.  07/18/2019: 50 units   ipratropium-albuterol (DUONEB) 0.5-2.5 (  3) MG/3ML SOLN Take 3 mLs by nebulization every 6 (six) hours. 02/21/2019: Taking QID    mupirocin ointment (BACTROBAN) 2 % Apply 1 application topically 2 (two) times daily. To open blisters bilateral legs.    naproxen (NAPROSYN) 500 MG tablet Take 500 mg by mouth 2 (two) times daily with a meal. Arthritis 05/23/2019: PRN    olmesartan-hydrochlorothiazide (BENICAR HCT) 40-25 MG tablet Take 1 tablet by mouth daily.     QVAR REDIHALER 40 MCG/ACT inhaler 1 puff 2 (two) times daily.  05/23/2019: Using PRN   rosuvastatin (CRESTOR) 40 MG tablet Take 1 tablet (40 mg total) by mouth at bedtime. (Patient taking differently: Take 40 mg by mouth at bedtime. 20  mg once a day)    vitamin B-12 (CYANOCOBALAMIN) 500 MCG tablet Take 1,000 mcg by mouth daily. Two at night    Vitamin D, Cholecalciferol, 25 MCG (1000 UT) CAPS Take 50 mcg by mouth daily. 2 a night    carvedilol (COREG) 25 MG tablet Take 1 tablet (25 mg total) by mouth 2 (two) times daily with a meal. (Patient not taking: Reported on 10/20/2019)    insulin aspart (NOVOLOG) 100 UNIT/ML injection Inject into the skin 3 (three) times daily before meals. (Patient not taking: Reported on 10/20/2019)    No facility-administered encounter medications on file as of 10/20/2019.   Patient Active Problem List   Diagnosis Date Noted   Abscess 10/16/2019   Pain in both lower extremities 07/14/2019   Peripheral vascular disease with stasis dermatitis 05/23/2019   B12 deficiency 04/23/2019   Transient alteration of awareness 04/21/2019   Constipation 04/21/2019   Tremor 11/14/2018   Morbid obesity ([facility]) 09/07/2017   COPD (chronic obstructive pulmonary disease) ([place]) 04/29/2017   Intertrigo 02/02/2017   BPH (benign prostatic hyperplasia) 07/23/2016   Advanced care planning/counseling discussion 16/12/9602   Eosinophilic esophagitis 54/11/8117   PAD (peripheral artery disease) ([place]) 01/08/2016   Gastroesophageal reflux disease without esophagitis 01/06/2016   Hyperlipidemia associated with type 2 diabetes mellitus ([place]) 01/16/2015   Sleep apnea 01/16/2015   BMI 40.0-44.9, adult ([place]) 01/16/2015   Hypertension associated with diabetes ([place]) 10/11/2014   Type 2 diabetes mellitus with proteinuria ([place]) 10/11/2014   Back pain 10/11/2014   History of colon cancer    Past Medical History:  Diagnosis Date   Arthritis    Asthma 2000   Back pain    Colon polyp 2012   COPD (chronic obstructive pulmonary disease) ([facility])    Diabetes mellitus without complication ([facility])    Emphysema of lung ([facility])    Heart murmur    Hernia 2000   Hyperlipidemia    Hypertension     Personal history of malignant neoplasm of large intestine    Personal history of tobacco use, presenting hazards to health    Sleep apnea    Special screening for malignant neoplasms, colon    Relevant past medical, surgical, family and social history reviewed and updated as indicated. Interim medical history since our last visit reviewed. Allergies and medications reviewed and updated.  Review of Systems  Constitutional: Positive for fatigue. Negative for activity change, appetite change, fever and unexpected weight change.  Gastrointestinal: Negative.  Negative for constipation, diarrhea, nausea and vomiting.  Genitourinary: Positive for dysuria, frequency and urgency. Negative for decreased urine volume, discharge, flank pain, hematuria, penile pain and penile swelling.  Musculoskeletal: Negative.  Negative for back pain.  Skin: Positive for wound.  Hematological: Bruises/bleeds easily.    Per HPI unless specifically  indicated above     Objective:    BP (!) 146/82 (BP Location: Left Arm, Cuff Size: Normal)    Pulse 51    Temp 98.4 F (36.9 C) (Oral)    Wt (!) 303 lb (137.4 kg)    SpO2 93%    BMI 42.26 kg/m   Wt Readings from Last 3 Encounters:  10/20/19 (!) 303 lb (137.4 kg)  10/16/19 (!) 303 lb 9.6 oz (137.7 kg)  10/06/19 295 lb (133.8 kg)    Physical Exam Vitals reviewed.  Constitutional:      Appearance: Normal appearance. He is obese.  Abdominal:     General: Abdomen is flat. There is no distension.     Palpations: Abdomen is soft.     Tenderness: There is no abdominal tenderness.  Musculoskeletal:        General: Normal range of motion.     Right lower leg: No edema.     Left lower leg: No edema.  Skin:    General: Skin is warm and dry.     Coloration: Skin is not jaundiced or pale.     Findings: Abscess present.       Neurological:     General: No focal deficit present.     Mental Status: He is alert and oriented to person, place, and time.     Motor: No  weakness.     Gait: Gait normal.  Psychiatric:        Mood and Affect: Mood normal.        Behavior: Behavior normal.        Thought Content: Thought content normal.        Judgment: Judgment normal.        Assessment & Plan:   Problem List Items Addressed This Visit      Cardiovascular and Mediastinum   Hypertension associated with diabetes ([facility])    Chronic, uncontrolled.  BP remains above goal in office today on recheck.  Encouraged close monitoring of BP at home and close follow up with PCP and Cardiology to re-assess and possibly adjust medication.          Genitourinary   BPH (benign prostatic hyperplasia)    Chronic, ongoing.  Reports ongoing for >1 year and embarrassing at times because cannot make it in the house without wetting his pants.  Last PSA years ago, will check today.  Appears that tamsulosin has been stopped, unclear reason why, however patient reports it did not help his symptoms.  UA negative today except for 3+ protein which has been present in the past.  May consider referral to Urology if symptoms continue to be bothersome.        Other   Abscess    Acute, ongoing.  Appears to be healing well with approximated edges.  Continue doxycycline until completion.  Okay to shower and remove bandage while showering.  Replace bandage after shower.  Return to clinic if pain, redness, swelling, returns.       Other Visit Diagnoses    Urinary urgency    -  Primary   Relevant Orders   Urinalysis, Routine w reflex microscopic   PSA       Follow up plan: Return if symptoms worsen or fail to improve.

## 2019-10-20 NOTE — Patient Instructions (Signed)
Continue the great wound care, Chris.   Be sure to schedule a follow up with Jolene for ASAP when she returns.    We will let you know about your PSA blood test on Monday.    Prostate Cancer Screening  Prostate cancer screening is a test that is done to check for the presence of prostate cancer in men. The prostate gland is a walnut-sized gland that is located below the bladder and in front of the rectum in males. The function of the prostate is to add fluid to semen during ejaculation. Prostate cancer is the second most common type of cancer in men. Who should have prostate cancer screening?  Screening recommendations vary based on age and other risk factors. Screening is recommended if:  You are older than age 32. If you are age 70-69, talk with your health care provider about your need for screening and how often screening should be done. Because most prostate cancers are slow growing and will not cause death, screening is generally reserved in this age group for men who have a 10-15-year life expectancy.  You are younger than age 12, and you have these risk factors: ? Being a black male or a male of African descent. ? Having a father, brother, or uncle who has been diagnosed with prostate cancer. The risk is higher if your family member's cancer occurred at an early age. Screening is not recommended if:  You are younger than age 55.  You are between the ages of 87 and 63 and you have no risk factors.  You are 38 years of age or older. At this age, the risks that screening can cause are greater than the benefits that it may provide. If you are at high risk for prostate cancer, your health care provider may recommend that you have screenings more often or that you start screening at a younger age. How is screening for prostate cancer done? The recommended prostate cancer screening test is a blood test called the prostate-specific antigen (PSA) test. PSA is a protein that is made in the  prostate. As you age, your prostate naturally produces more PSA. Abnormally high PSA levels may be caused by:  Prostate cancer.  An enlarged prostate that is not caused by cancer (benign prostatic hyperplasia, BPH). This condition is very common in older men.  A prostate gland infection (prostatitis). Depending on the PSA results, you may need more tests, such as:  A physical exam to check the size of your prostate gland.  Blood and imaging tests.  A procedure to remove tissue samples from your prostate gland for testing (biopsy). What are the benefits of prostate cancer screening?  Screening can help to identify cancer at an early stage, before symptoms start and when the cancer can be treated more easily.  There is a small chance that screening may lower your risk of dying from prostate cancer. The chance is small because prostate cancer is a slow-growing cancer, and most men with prostate cancer die from a different cause. What are the risks of prostate cancer screening? The main risk of prostate cancer screening is diagnosing and treating prostate cancer that would never have caused any symptoms or problems. This is called overdiagnosisand overtreatment. PSA screening cannot tell you if your PSA is high due to cancer or a different cause. A prostate biopsy is the only procedure to diagnose prostate cancer. Even the results of a biopsy may not tell you if your cancer needs to be treated.  Slow-growing prostate cancer may not need any treatment other than monitoring, so diagnosing and treating it may cause unnecessary stress or other side effects. A prostate biopsy may also cause:  Infection or fever.  A false negative. This is a result that shows that you do not have prostate cancer when you actually do have prostate cancer. Questions to ask your health care provider  When should I start prostate cancer screening?  What is my risk for prostate cancer?  How often do I need  screening?  What type of screening tests do I need?  How do I get my test results?  What do my results mean?  Do I need treatment? Where to find more information  The American Cancer Society: www.cancer.org  American Urological Association: www.auanet.org Contact a health care provider if:  You have difficulty urinating.  You have pain when you urinate or ejaculate.  You have blood in your urine or semen.  You have pain in your back or in the area of your prostate. Summary  Prostate cancer is a common type of cancer in men. The prostate gland is located below the bladder and in front of the rectum. This gland adds fluid to semen during ejaculation.  Prostate cancer screening may identify cancer at an early stage, when the cancer can be treated more easily.  The prostate-specific antigen (PSA) test is the recommended screening test for prostate cancer.  Discuss the risks and benefits of prostate cancer screening with your health care provider. If you are age 1 or older, the risks that screening can cause are greater than the benefits that it may provide. This information is not intended to replace advice given to you by your health care provider. Make sure you discuss any questions you have with your health care provider. Document Revised: 10/20/2018 Document Reviewed: 10/20/2018 Elsevier Patient Education  [place].

## 2019-10-20 NOTE — Assessment & Plan Note (Signed)
Chronic, uncontrolled.  BP remains above goal in office today on recheck.  Encouraged close monitoring of BP at home and close follow up with PCP and Cardiology to re-assess and possibly adjust medication.

## 2019-10-20 NOTE — Assessment & Plan Note (Signed)
Acute, ongoing.  Appears to be healing well with approximated edges.  Continue doxycycline until completion.  Okay to shower and remove bandage while showering.  Replace bandage after shower.  Return to clinic if pain, redness, swelling, returns.

## 2019-10-20 NOTE — Assessment & Plan Note (Signed)
Chronic, ongoing.  Reports ongoing for >1 year and embarrassing at times because cannot make it in the house without wetting his pants.  Last PSA years ago, will check today.  Appears that tamsulosin has been stopped, unclear reason why, however patient reports it did not help his symptoms.  UA negative today except for 3+ protein which has been present in the past.  May consider referral to Urology if symptoms continue to be bothersome.

## 2019-10-21 LAB — PSA: Prostate Specific Ag, Serum: 1.9 ng/mL (ref 0.0–4.0)

## 2019-10-24 ENCOUNTER — Encounter: Payer: Self-pay | Admitting: Nurse Practitioner

## 2019-10-25 ENCOUNTER — Ambulatory Visit: Payer: Medicare Other | Admitting: Pharmacist

## 2019-10-25 DIAGNOSIS — Z794 Long term (current) use of insulin: Secondary | ICD-10-CM

## 2019-10-25 DIAGNOSIS — I152 Hypertension secondary to endocrine disorders: Secondary | ICD-10-CM

## 2019-10-25 DIAGNOSIS — E1165 Type 2 diabetes mellitus with hyperglycemia: Secondary | ICD-10-CM

## 2019-10-25 NOTE — Chronic Care Management (AMB) (Signed)
Chronic Care Management Pharmacy  Name: Justin Griffith  MRN: [id number] DOB: May 01, 1940   Chief Complaint/ HPI  Justin Griffith,  79 y.o. , male presents for their Follow-Up CCM visit with the clinical pharmacist via telephone due to COVID-19 Pandemic.Previously seen by [facility] Pharmacist  PCP : [name], NP Patient Care Team: [name], NP as PCP - General (Nurse Practitioner) Guadalupe Maple, MD (Family Medicine) Bary Castilla, Forest Gleason, MD (General Surgery) Carloyn Manner, MD as Referring Physician (Otolaryngology) Schnier, Dolores Lory, MD (Vascular Surgery) Abisogun, Domenica Reamer, MD as Consulting Physician (Internal Medicine) De Hollingshead, [facility] as Pharmacist (Pharmacist) Vanita Ingles, RN as Registered Nurse ([place]) Vladimir Faster, [facility] as Pharmacist (Pharmacist)  Their chronic conditions include: Hypertension, Hyperlipidemia, Diabetes, COPD, BPH and PVD   Office Visits: 10/20/19-Jessical Roosevelt Locks, NP- Urinary urgency, neg UTI, PSA level, follow up wound check 10/16/19-Jessica Roosevelt Locks, NP- Abscess lower right pannus I & D, doxycycline x 7 d 08/23/19- Marnee Guarneri, NP- lipid panel,B12 level  Consult Visit: 10/04/19- Dr. Carlyle Basques- Decrease carvedilol to 12.5mg  bid, compression stockings and elevation for edema 7/7/212 -[name], Endocrinology - No changes based on pt report of BG  Allergies  Allergen Reactions   Wilder Glade [Dapagliflozin] Rash   Jardiance [Empagliflozin] Rash    Medications: Outpatient Encounter Medications as of 10/25/2019  Medication Sig Note   acyclovir (ZOVIRAX) 400 MG tablet Take 400 mg by mouth 2 (two) times daily.    aspirin EC 81 MG tablet Take 81 mg by mouth daily.     carvedilol (COREG) 25 MG tablet Take 1 tablet (25 mg total) by mouth 2 (two) times daily with a meal.    clobetasol cream (TEMOVATE) 4.01 % Apply 1 application topically 2 (two) times daily. Apply to bilateral lower legs.    doxycycline  (VIBRA-TABS) 100 MG tablet Take 1 tablet (100 mg total) by mouth 2 (two) times daily.    furosemide (LASIX) 20 MG tablet Take 2 tablets (40 mg total) by mouth daily.    gabapentin (NEURONTIN) 300 MG capsule Start out taking 1 capsule (300 MG) at bedtime by mouth and if tolerated may increase 2 capsules (600 MG) at bedtime.    insulin glargine (LANTUS) 100 UNIT/ML injection Inject 80 Units into the skin daily.  07/18/2019: 50 units   ipratropium-albuterol (DUONEB) 0.5-2.5 (3) MG/3ML SOLN Take 3 mLs by nebulization every 6 (six) hours. 02/21/2019: Taking QID    QVAR REDIHALER 40 MCG/ACT inhaler 1 puff 2 (two) times daily.  05/23/2019: Using PRN   rosuvastatin (CRESTOR) 40 MG tablet Take 1 tablet (40 mg total) by mouth at bedtime. (Patient taking differently: Take 40 mg by mouth at bedtime. 20 mg once a day)    tamsulosin (FLOMAX) 0.4 MG CAPS capsule Take 0.4 mg by mouth daily. Patient restarted on his own due to trouble urinating    vitamin B-12 (CYANOCOBALAMIN) 500 MCG tablet Take 1,000 mcg by mouth daily. Two at night    Vitamin D, Cholecalciferol, 25 MCG (1000 UT) CAPS Take 50 mcg by mouth daily. 2 a night    cetirizine (ZYRTEC) 10 MG tablet Take 10 mg by mouth daily. (Patient not taking: Reported on 10/25/2019)    cloNIDine (CATAPRES) 0.2 MG tablet Take 0.2 mg by mouth 2 (two) times daily.  (Patient not taking: Reported on 10/25/2019)    insulin aspart (NOVOLOG) 100 UNIT/ML injection Inject into the skin 3 (three) times daily before meals. (Patient not taking: Reported on 10/20/2019)  mupirocin ointment (BACTROBAN) 2 % Apply 1 application topically 2 (two) times daily. To open blisters bilateral legs.    naproxen (NAPROSYN) 500 MG tablet Take 500 mg by mouth 2 (two) times daily with a meal. Arthritis (Patient not taking: Reported on 10/25/2019) 05/23/2019: PRN    olmesartan-hydrochlorothiazide (BENICAR HCT) 40-25 MG tablet Take 1 tablet by mouth daily.     No facility-administered encounter  medications on file as of 10/25/2019.     Current Diagnosis/Assessment:    Goals Addressed              This Visit's Progress     PharmD "I don't know much about my diabetes" (pt-stated)        CARE PLAN ENTRY (see longtitudinal plan of care for additional care plan information)  Chronic Disease Management support, education, and care coordination needs related to hypertension and diabetes.   Current barriers:  Diabetes:   uncontrolled; most recent A1c 8.3%(at Endocrinology) [facility] ([name])  Was seen in office 7/26 for pannus abscess for debridement and given doxy 100 bid x7 days. Expresses concern to me today that  developed one on his buttocks which ruptured and is draining.  I advised he call the office for an appoint and he states he has an appointment with his provider at the [facility] tomorrow and will ask her to address. I reminded him to take his  bottle of doxycycline with him to the appointment.  He complains of spells of "losing my balance and my arm flaps around" he also notes he occasionally has a shooting, tingling feel in his leg. All symptoms are on the right side. He denies, falling, blurred vision, slurred speech or loss of consciousness during these episodes. States the [name] is planning to do a test on his leg. He is still taking only 300mg  gabapentin at night.  Current antihyperglycemic regimen: Metformin 1000 mg BID, Lantus 60-75 units daily (self decreased from 80 units); reports he has not used rapid acting insulin since march. ? Hx significant GI upset and intolerance with Victoza and Trulicity, denies ever having tried Ozempic before ? Hx allergic rash to [place] ? Notes that he was previously on Humalog and it "made my ankles swell" ? Notes that Lantus "works better" than Toujeo for him ? Recent  visit [name] noted  A1C 8.3 . No changes made to regimen based on patient report or FBG  levels 140-150 and 90s later in day.  ? He reported to me that his post-prandial BG is around 140 while AM fbg ranges 150-180. I asked that he bring in his meter next time   Pharmacist Clinical Goal(s):   Over the next 90 days, patient will work with PharmD and primary care provider to address optimized medication management and work toward decreasing A1c to goal.  Interventions:  Comprehensive medication review and update in EMR.  Counseled on the importance of  lifestyle modifications especially diet on getting his diabetes under control.  Encouraged patient to eat small consistent meals with protein, limit starchy foods. He endorses portion control of only eating one hot dog instead of 3 or 4.   Encouraged regularly checking glucose 1-2 hours after meal in addition to AM.  Patient Self Care Activities:   Patient will check blood glucose daily , document, and provide at future appointments  Patient will take medications as prescribed  Patient will report any questions or concerns to provider  Hypertension BP Readings from Last 3 Encounters:  10/20/19 (!) 146/82  10/16/19 (!) 175/72  10/04/19 128/82    Pharmacist Clinical Goal(s): o Over the next 90 days, patient will work with PharmD and providers to acheive BP goal <130/80  o Current hypertensive regimen: carvedilol 25 mg BID (cardiology decreased to 12.5mg  twice based on most recent note) olmesartan/HCTZ 40/25 mg daily, furosemide 40 mg daily . I read cardiology note to patient and he voiced understanding.   Interventions:  Comprehensive medication review and update in EMR.  Researched specialist notes and relayed to patient.   Provided dietary and exercise counseling Counseled patient on the importance of checking BP at home and encouraged patient to obtain a new BP monitor from [name].   Patient self care activities - Over the next 90 days, patient will: o Check BP 1-2 times daily document, and provide at future  appointments o Ensure daily salt intake < 2300 mg/day    Medication Management   Pt uses [facility] for all medications Uses pill box? Yes Pt endorses 80% compliance but also states he takes so many meds he can't keep up with what he takes.   Please see past updates related to this goal by clicking on the "Past Updates" button in the selected goal    Continue current medication management strategy    Follow up: three month phone visit  Junita Push. Victorio Creeden PharmD, BCPS Clinical Pharmacist [facility] [phone]    Please see past updates related to this goal by clicking on the "Past Updates" button in the selected goal         Diabetes   Recent Relevant Labs: Lab Results  Component Value Date/Time   HGBA1C 7.7 01/11/2018 12:00 AM   HGBA1C 6.9 02/02/2017 08:23 AM   HGBA1C 9.1 (H) 05/01/2015 09:43 AM   MICROALBUR 150 (H) 03/28/2019 11:33 AM   MICROALBUR 30 (H) 01/30/2016 08:24 AM     Checking BG: 2x per Day  Recent FBG Readings:149, 160, 180 Recent pre-meal BG readings: unknown Recent 2hr PP BG readings:  140? Recent HS BG readings: unknown Patient has failed these meds in past: Jardiance, Farxiga, Victoza, Trulicity Humalog, Toujeo Patient is currently uncontrolled on the following medications:   Metformin 1000 mg bid  Lantus 60-75 units daily (self decrease from 80 units)  Last diabetic Foot exam:  Lab Results  Component Value Date/Time   HMDIABEYEEXA No Retinopathy 03/05/2016 01:53 PM    Last diabetic Foot  exam:  09/27/19 Endocrinology   We discussed: diet and exercise extensively and how to recognize and treat signs of hypoglycemia. Discussed limiting starchy foods, portion control, eating consistent meals with lean proteins and vegetables. He endorses "eating what I want but not as much of it" gives an example of having one hot dog instead of 3 or 4.He complains of spells of "losing my balance and my arm flaps around" he also notes  he occasionally has a shooting, tingling feel in his leg. All symptoms are on the right side. He denies, falling, blurred vision, slurred speech or loss of consciousness during these episodes. States the [name] is planning to do a test on his leg. He is still taking only 300mg  gabapentin at night.     Plan  Continue current medications  Hypertension   BP goal is:  <130/80  Office blood pressures are  BP Readings from Last 3 Encounters:  10/20/19 (!) 146/82  10/16/19 (!) 175/72  10/04/19 128/82   Patient  checks BP at home not recently. He reports needing a new meter. Patient home BP readings are ranging: Not taking.  Patient has failed these meds in the past: clonidine, olmesartan/amlodipine/HCTZ Patient is currently uncontrolled on the following medications:   Carvedilol 12.5 mg bid (decreased dose per Cardiology note 10/04/19 new script not sent)  Lasix 40 mg qd  Olmesartan/HCTZ 40/25mg  qd   We discussed diet and exercise for DM and HTN including reduced carbohydrates and sodium. Patient needs a new BP Monitor and will request at visit with provider scheduled at the [facility] tomorrow. Dr. Shelby Mattocks note from 10/04/19 indicating reduced carvedilol dose to 12.5 mg bid based on low bp and mild bradycardia. Patient will discuss with [place] provider tomorrow.  Plan  Continue current medications .       Medication Management   [facility] Uses pill box? Yes Pt endorses 75% compliance    Plan  Continue current medication management strategy. Will ask patient to bring meds to next visit as it difficult to determine what he actually takes given his extensive provider network.    Follow up: three month phone visit  Junita Push. Kenton Kingfisher PharmD, [facility] [phone]

## 2019-10-28 NOTE — Patient Instructions (Addendum)
Visit Information  It was a pleasure speaking with you today! Thank you for letting me be a part of your care team. Please call with any questions or concerns.  Goals Addressed              This Visit's Progress   .  PharmD "I don't know much about my diabetes" (pt-stated)        CARE PLAN ENTRY (see longtitudinal plan of care for additional care plan information) . Chronic Disease Management support, education, and care coordination needs related to hypertension and diabetes.   Current barriers: . Diabetes:  . uncontrolled; most recent A1c 8.3%(at Endocrin[facility]) [facility] (Dr. Honor Griffith) . Was seen in office 7/26 for pannus abscess for debridement and given doxy 100 bid x7 days. Expresses concern to me today that  developed one on his buttocks which ruptured and is draining.  I advised he call the office for an appoint and he states he has an appointment with his provider at the [facility] tomorrow and will ask her to address. I reminded him to take his  bottle of doxycycline with him to the appointment. Marland Kitchen He complains of spells of "losing my balance and my arm flaps around" he also notes he occasionally has a shooting, tingling feel in his leg. All symptoms are on the right side. He denies, falling, blurred vision, slurred speech or loss of consciousness during these episodes. States the [name] is planning to do a test on his leg. He is still taking only 300mg  gabapentin at night. . Current antihyperglycemic regimen: Metformin 1000 mg BID, Lantus 60-75 units daily (self decreased from 80 units); reports he has not used rapid acting insulin since march. ? Hx significant GI upset and intolerance with Victoza and Trulicity, denies ever having tried Ozempic before ? Hx allergic rash to [place] ? Notes that he was previously on Humalog and it "made my ankles swell" ? Notes that Lantus "works better" than Toujeo for him ? Recent  visit Dr.  Honor Griffith noted  A1C 8.3 . No changes made to regimen based on patient report or FBG levels 140-150 and 90s later in day.  ? He reported to me that his post-prandial BG is around 140 while AM fbg ranges 150-180. I asked that he bring in his meter next time   Pharmacist Clinical Goal(s):  Marland Kitchen Over the next 90 days, patient will work with PharmD and primary care provider to address optimized medication management and work toward decreasing A1c to goal.  Interventions: . Comprehensive medication review and update in EMR. . Counseled on the importance of  lifestyle modifications especially diet on getting his diabetes under control.  Encouraged patient to eat small consistent meals with protein, limit starchy foods. He endorses portion control of only eating one hot dog instead of 3 or 4.  . Encouraged regularly checking glucose 1-2 hours after meal in addition to AM.  Patient Self Care Activities:  . Patient will check blood glucose daily , document, and provide at future appointments . Patient will take medications as prescribed . Patient will report any questions or concerns to provider     Hypertension BP Readings from Last 3 Encounters:  10/20/19 (!) 146/82  10/16/19 (!) 175/72  10/04/19 128/82   . Pharmacist Clinical Goal(s): o Over the next 90 days, patient will work with PharmD and providers to acheive BP goal <130/80  o Current hypertensive regimen: carvedilol 25 mg BID (cardiology decreased  to 12.5mg  twice based on most recent note) olmesartan/HCTZ 40/25 mg daily, furosemide 40 mg daily . I read cardiology note to patient and he voiced understanding.  . Interventions:  Comprehensive medication review and update in EMR.  Researched specialist notes and relayed to patient.   Provided dietary and exercise counseling Counseled patient on the importance of checking BP at home and encouraged patient to obtain a new BP monitor from [name].  Marland Kitchen Patient self care activities - Over the next  90 days, patient will: o Check BP 1-2 times daily document, and provide at future appointments o Ensure daily salt intake < 2300 mg/day    Medication Management   Pt uses [facility] for all medications Uses pill box? Yes Pt endorses 80% compliance but also states he takes so many meds he can't keep up with what he takes.   Please see past updates related to this goal by clicking on the "Past Updates" button in the selected goal    Continue current medication management strategy    Follow up: three month phone visit  Justin Griffith PharmD, BCPS Clinical Pharmacist [facility] [phone]    Please see past updates related to this goal by clicking on the "Past Updates" button in the selected goal         Justin Griffith was given information about Chronic Care Management services today including:  1. CCM service includes personalized support from designated clinical staff supervised by his physician, including individualized plan of care and coordination with other care providers 2. 24/7 contact phone numbers for assistance for urgent and routine care needs. 3. Standard insurance, coinsurance, copays and deductibles apply for chronic care management only during months in which we provide at least 20 minutes of these services. Most insurances cover these services at 100%, however patients may be responsible for any copay, coinsurance and/or deductible if applicable. This service may help you avoid the need for more expensive face-to-face services. 4. Only one practitioner may furnish and bill the service in a calendar month. 5. The patient may stop CCM services at any time (effective at the end of the month) by phone call to the office staff.  Patient agreed to services and verbal consent obtained.   The patient verbalized understanding of instructions provided today and agreed to receive a mailed copy of patient instruction and/or educational  materials. Telephone follow up appointment with pharmacy team member scheduled for: 3 months  Justin Griffith PharmD, BCPS Clinical Pharmacist [phone]  Managing Your Hypertension Hypertension is commonly called high blood pressure. This is when the force of your blood pressing against the walls of your arteries is too strong. Arteries are blood vessels that carry blood from your heart throughout your body. Hypertension forces the heart to work harder to pump blood, and may cause the arteries to become narrow or stiff. Having untreated or uncontrolled hypertension can cause heart attack, stroke, kidney disease, and other problems. What are blood pressure readings? A blood pressure reading consists of a higher number over a lower number. Ideally, your blood pressure should be below 120/80. The first ("top") number is called the systolic pressure. It is a measure of the pressure in your arteries as your heart beats. The second ("bottom") number is called the diastolic pressure. It is a measure of the pressure in your arteries as the heart relaxes. What does my blood pressure reading mean? Blood pressure is classified into four stages. Based on your blood pressure reading,  your health care provider may use the following stages to determine what type of treatment you need, if any. Systolic pressure and diastolic pressure are measured in a unit called mm Hg. Normal  Systolic pressure: below 338.  Diastolic pressure: below 80. Elevated  Systolic pressure: 250-539.  Diastolic pressure: below 80. Hypertension stage 1  Systolic pressure: 767-341.  Diastolic pressure: 93-79. Hypertension stage 2  Systolic pressure: 024 or above.  Diastolic pressure: 90 or above. What health risks are associated with hypertension? Managing your hypertension is an important responsibility. Uncontrolled hypertension can lead to:  A heart attack.  A stroke.  A weakened blood vessel (aneurysm).  Heart  failure.  Kidney damage.  Eye damage.  Metabolic syndrome.  Memory and concentration problems. What changes can I make to manage my hypertension? Hypertension can be managed by making lifestyle changes and possibly by taking medicines. Your health care provider will help you make a plan to bring your blood pressure within a normal range. Eating and drinking   Eat a diet that is high in fiber and potassium, and low in salt (sodium), added sugar, and fat. An example eating plan is called the DASH (Dietary Approaches to Stop Hypertension) diet. To eat this way: ? Eat plenty of fresh fruits and vegetables. Try to fill half of your plate at each meal with fruits and vegetables. ? Eat whole grains, such as whole wheat pasta, brown rice, or whole grain bread. Fill about one quarter of your plate with whole grains. ? Eat low-fat diary products. ? Avoid fatty cuts of meat, processed or cured meats, and poultry with skin. Fill about one quarter of your plate with lean proteins such as fish, chicken without skin, beans, eggs, and tofu. ? Avoid premade and processed foods. These tend to be higher in sodium, added sugar, and fat.  Reduce your daily sodium intake. Most people with hypertension should eat less than 1,500 mg of sodium a day.  Limit alcohol intake to no more than 1 drink a day for nonpregnant women and 2 drinks a day for men. One drink equals 12 oz of beer, 5 oz of wine, or 1 oz of hard liquor. Lifestyle  Work with your health care provider to maintain a healthy body weight, or to lose weight. Ask what an ideal weight is for you.  Get at least 30 minutes of exercise that causes your heart to beat faster (aerobic exercise) most days of the week. Activities may include walking, swimming, or biking.  Include exercise to strengthen your muscles (resistance exercise), such as weight lifting, as part of your weekly exercise routine. Try to do these types of exercises for 30 minutes at least  3 days a week.  Do not use any products that contain nicotine or tobacco, such as cigarettes and e-cigarettes. If you need help quitting, ask your health care provider.  Control any long-term (chronic) conditions you have, such as high cholesterol or diabetes. Monitoring  Monitor your blood pressure at home as told by your health care provider. Your personal target blood pressure may vary depending on your medical conditions, your age, and other factors.  Have your blood pressure checked regularly, as often as told by your health care provider. Working with your health care provider  Review all the medicines you take with your health care provider because there may be side effects or interactions.  Talk with your health care provider about your diet, exercise habits, and other lifestyle factors that may be contributing  to hypertension.  Visit your health care provider regularly. Your health care provider can help you create and adjust your plan for managing hypertension. Will I need medicine to control my blood pressure? Your health care provider may prescribe medicine if lifestyle changes are not enough to get your blood pressure under control, and if:  Your systolic blood pressure is 130 or higher.  Your diastolic blood pressure is 80 or higher. Take medicines only as told by your health care provider. Follow the directions carefully. Blood pressure medicines must be taken as prescribed. The medicine does not work as well when you skip doses. Skipping doses also puts you at risk for problems. Contact a health care provider if:  You think you are having a reaction to medicines you have taken.  You have repeated (recurrent) headaches.  You feel dizzy.  You have swelling in your ankles.  You have trouble with your vision. Get help right away if:  You develop a severe headache or confusion.  You have unusual weakness or numbness, or you feel faint.  You have severe pain in your  chest or abdomen.  You vomit repeatedly.  You have trouble breathing. Summary  Hypertension is when the force of blood pumping through your arteries is too strong. If this condition is not controlled, it may put you at risk for serious complications.  Your personal target blood pressure may vary depending on your medical conditions, your age, and other factors. For most people, a normal blood pressure is less than 120/80.  Hypertension is managed by lifestyle changes, medicines, or both. Lifestyle changes include weight loss, eating a healthy, low-sodium diet, exercising more, and limiting alcohol. This information is not intended to replace advice given to you by your health care provider. Make sure you discuss any questions you have with your health care provider. Document Revised: 07/01/2018 Document Reviewed: 02/05/2016 Elsevier Patient Education  [employer].

## 2019-10-30 NOTE — Progress Notes (Signed)
Pt has been scheduled for 3 month f/u 01/29/2020 pt requested face to face due to not being able to hear on the phone

## 2019-11-01 DIAGNOSIS — H906 Mixed conductive and sensorineural hearing loss, bilateral: Secondary | ICD-10-CM | POA: Diagnosis not present

## 2019-11-06 ENCOUNTER — Other Ambulatory Visit: Payer: Self-pay

## 2019-11-06 ENCOUNTER — Ambulatory Visit (INDEPENDENT_AMBULATORY_CARE_PROVIDER_SITE_OTHER): Payer: Medicare Other | Admitting: Nurse Practitioner

## 2019-11-06 ENCOUNTER — Encounter: Payer: Self-pay | Admitting: Nurse Practitioner

## 2019-11-06 DIAGNOSIS — E1159 Type 2 diabetes mellitus with other circulatory complications: Secondary | ICD-10-CM | POA: Diagnosis not present

## 2019-11-06 DIAGNOSIS — I10 Essential (primary) hypertension: Secondary | ICD-10-CM | POA: Diagnosis not present

## 2019-11-06 DIAGNOSIS — I152 Hypertension secondary to endocrine disorders: Secondary | ICD-10-CM

## 2019-11-06 MED ORDER — HYDRALAZINE HCL 10 MG PO TABS: 10.0000 mg | ORAL_TABLET | Freq: Two times a day (BID) | ORAL | 4 refills | Status: DC

## 2019-11-06 NOTE — Progress Notes (Addendum)
BP (!) 158/80 (BP Location: Left Arm)   Pulse 61   Temp 98.5 F (36.9 C) (Oral)   Wt 294 lb 9.6 oz (133.6 kg)   SpO2 93%   BMI 41.09 kg/m    Subjective:    Patient ID: Justin Griffith, male    DOB: 1940-11-11, 79 y.o.   MRN: [id number]  HPI: Justin Griffith is a 79 y.o. male  Chief Complaint  Patient presents with  . Hypertension    pt states that his BP has been running high    HYPERTENSION/HLD Continues on Olmesartan, Clonidine, HCTZ, Carvedilol,Lasix 40 MG,and ASA+ Crestor.He feels the Clonidine is caused increased fatigue -- had not been taking it twice a day, only taking once a day and at this time is not sure he is taking this anymore.  He is poor historian.  Sees [name] for cardiology, saw last in 10/04/19 when BP was 128/82 and they reduced his Carvedilol to 12.5 MG BID due to lower BP and HR -- he reports continuing to take 25 MG BID. He sees [name] next week and will discuss with him. Last EF >55% 06/22/2018. Hypertension status:uncontrolled Satisfied with current treatment?yes Duration of hypertension:chronic BP monitoring frequency:daily -- does not feel it is working right, has new cuff got yesterday BP range:150/70-80's BP medication side effects:no Medication compliance:good compliance Previous BP meds:Losartan, Clonidine, HCTZ, Carvedilol, Amlodipine, Lasix Aspirin:yes Recurrent headaches:none, although reports he has been having some eye pain since eye exam at [facility] when they flashed light in eyes Visual changes:no Palpitations:no Dyspnea:At baseline Chest pain:no Lower extremity edema:mild, improving Dizzy/lightheaded:no  Relevant past medical, surgical, family and social history reviewed and updated as indicated. Interim medical history since our last visit reviewed. Allergies and medications reviewed and updated.  Review of Systems  Constitutional: Negative for activity change, diaphoresis, fatigue and fever.    Respiratory: Positive for shortness of breath (at baseline) and wheezing (at baseline). Negative for cough and chest tightness.   Cardiovascular: Positive for leg swelling (improving). Negative for chest pain and palpitations.  Gastrointestinal: Negative.   Endocrine: Negative for cold intolerance, heat intolerance, polydipsia, polyphagia and polyuria.  Neurological: Negative.   Psychiatric/Behavioral: Negative.     Per HPI unless specifically indicated above     Objective:    BP (!) 158/80 (BP Location: Left Arm)   Pulse 61   Temp 98.5 F (36.9 C) (Oral)   Wt 294 lb 9.6 oz (133.6 kg)   SpO2 93%   BMI 41.09 kg/m   Wt Readings from Last 3 Encounters:  11/06/19 294 lb 9.6 oz (133.6 kg)  10/20/19 (!) 303 lb (137.4 kg)  10/16/19 (!) 303 lb 9.6 oz (137.7 kg)    Physical Exam Vitals and nursing note reviewed.  Constitutional:      General: He is awake. He is not in acute distress.    Appearance: He is well-developed. He is morbidly obese. He is not ill-appearing.  HENT:     Head: Normocephalic and atraumatic.     Right Ear: Hearing normal. No drainage.     Left Ear: Hearing normal. No drainage.  Eyes:     General: Lids are normal.        Right eye: No discharge.        Left eye: No discharge.     Conjunctiva/sclera: Conjunctivae normal.     Pupils: Pupils are equal, round, and reactive to light.  Neck:     Vascular: No carotid bruit.  Cardiovascular:  Rate and Rhythm: Normal rate and regular rhythm.     Heart sounds: Normal heart sounds, S1 normal and S2 normal. No murmur heard.  No gallop.   Pulmonary:     Effort: Pulmonary effort is normal. No accessory muscle usage or respiratory distress.     Breath sounds: Normal breath sounds.  Abdominal:     General: Bowel sounds are normal.     Palpations: Abdomen is soft. There is no splenomegaly.  Musculoskeletal:        General: Normal range of motion.     Cervical back: Normal range of motion and neck supple.      Right lower leg: 1+ Pitting Edema present.     Left lower leg: 1+ Pitting Edema present.  Skin:    General: Skin is warm and dry.     Capillary Refill: Capillary refill takes less than 2 seconds.     Comments: Moderate xerosis BLE, scaling and white.  Neurological:     Mental Status: He is alert and oriented to person, place, and time.  Psychiatric:        Attention and Perception: Attention normal.        Mood and Affect: Mood normal.        Speech: Speech normal.        Behavior: Behavior normal. Behavior is cooperative.     Results for orders placed or performed in visit on 10/20/19  Microscopic Examination   Urine  Result Value Ref Range   WBC, UA None seen 0 - 5 /hpf   RBC None seen 0 - 2 /hpf   Epithelial Cells (non renal) 0-10 0 - 10 /hpf   Bacteria, UA None seen None seen/Few  Urinalysis, Routine w reflex microscopic  Result Value Ref Range   Specific Gravity, UA 1.015 1.005 - 1.030   pH, UA 5.5 5.0 - 7.5   Color, UA Yellow Yellow   Appearance Ur Clear Clear   Leukocytes,UA Negative Negative   Protein,UA 3+ (A) Negative/Trace   Glucose, UA Negative Negative   Ketones, UA Negative Negative   RBC, UA Negative Negative   Bilirubin, UA Negative Negative   Urobilinogen, Ur 0.2 0.2 - 1.0 mg/dL   Nitrite, UA Negative Negative   Microscopic Examination See below:   PSA  Result Value Ref Range   Prostate Specific Ag, Serum 1.9 0.0 - 4.0 ng/mL      Assessment & Plan:   Problem List Items Addressed This Visit      Cardiovascular and Mediastinum   Hypertension associated with diabetes ([place])    Chronic, ongoing with BP elevated above goal in office and at home.  No red flag symptoms.   Has underlying circulation issues and have recommended he wear compression hose at home + decrease sodium intake.  Continue current medication regimen with exception of Clonidine will try discontinuing this as not taking it BID (or at all) and reports fatigue with it, change to Hydralazine  10 MG BID + continue collaboration with cardiology and [place] PCP. Wrote directions down for him as he is a poor historian and does not always follow instructions well.  Recommend he bring all medications to his provider visits and document all BP and BS readings, which should bring with him.  Continue to check BP daily at home and document.  Return in September as schedule and obtain BMP at this visit.      Relevant Medications   furosemide (LASIX) 40 MG tablet   hydrALAZINE (APRESOLINE)  10 MG tablet       Follow up plan: Return for as scheduled -- should have diabetes visit in September.

## 2019-11-06 NOTE — Patient Instructions (Addendum)
Stop Clonidine and change to Hydralazine, a prescription has been sent on this for you.  DASH Eating Plan DASH stands for "Dietary Approaches to Stop Hypertension." The DASH eating plan is a healthy eating plan that has been shown to reduce high blood pressure (hypertension). It may also reduce your risk for type 2 diabetes, heart disease, and stroke. The DASH eating plan may also help with weight loss. What are tips for following this plan?  General guidelines  Avoid eating more than 2,300 mg (milligrams) of salt (sodium) a day. If you have hypertension, you may need to reduce your sodium intake to 1,500 mg a day.  Limit alcohol intake to no more than 1 drink a day for nonpregnant women and 2 drinks a day for men. One drink equals 12 oz of beer, 5 oz of wine, or 1 oz of hard liquor.  Work with your health care provider to maintain a healthy body weight or to lose weight. Ask what an ideal weight is for you.  Get at least 30 minutes of exercise that causes your heart to beat faster (aerobic exercise) most days of the week. Activities may include walking, swimming, or biking.  Work with your health care provider or diet and nutrition specialist (dietitian) to adjust your eating plan to your individual calorie needs. Reading food labels   Check food labels for the amount of sodium per serving. Choose foods with less than 5 percent of the Daily Value of sodium. Generally, foods with less than 300 mg of sodium per serving fit into this eating plan.  To find whole grains, look for the word "whole" as the first word in the ingredient list. Shopping  Buy products labeled as "low-sodium" or "no salt added."  Buy fresh foods. Avoid canned foods and premade or frozen meals. Cooking  Avoid adding salt when cooking. Use salt-free seasonings or herbs instead of table salt or sea salt. Check with your health care provider or pharmacist before using salt substitutes.  Do not fry foods. Cook foods  using healthy methods such as baking, boiling, grilling, and broiling instead.  Cook with heart-healthy oils, such as olive, canola, soybean, or sunflower oil. Meal planning  Eat a balanced diet that includes: ? 5 or more servings of fruits and vegetables each day. At each meal, try to fill half of your plate with fruits and vegetables. ? Up to 6-8 servings of whole grains each day. ? Less than 6 oz of lean meat, poultry, or fish each day. A 3-oz serving of meat is about the same size as a deck of cards. One egg equals 1 oz. ? 2 servings of low-fat dairy each day. ? A serving of nuts, seeds, or beans 5 times each week. ? Heart-healthy fats. Healthy fats called Omega-3 fatty acids are found in foods such as flaxseeds and coldwater fish, like sardines, salmon, and mackerel.  Limit how much you eat of the following: ? Canned or prepackaged foods. ? Food that is high in trans fat, such as fried foods. ? Food that is high in saturated fat, such as fatty meat. ? Sweets, desserts, sugary drinks, and other foods with added sugar. ? Full-fat dairy products.  Do not salt foods before eating.  Try to eat at least 2 vegetarian meals each week.  Eat more home-cooked food and less restaurant, buffet, and fast food.  When eating at a restaurant, ask that your food be prepared with less salt or no salt, if possible. What foods  are recommended? The items listed may not be a complete list. Talk with your dietitian about what dietary choices are best for you. Grains Whole-grain or whole-wheat bread. Whole-grain or whole-wheat pasta. Brown rice. [place]. Bulgur. Whole-grain and low-sodium cereals. Pita bread. Low-fat, low-sodium crackers. Whole-wheat flour tortillas. Vegetables Fresh or frozen vegetables (raw, steamed, roasted, or grilled). Low-sodium or reduced-sodium tomato and vegetable juice. Low-sodium or reduced-sodium tomato sauce and tomato paste. Low-sodium or reduced-sodium canned  vegetables. Fruits All fresh, dried, or frozen fruit. Canned fruit in natural juice (without added sugar). Meat and other protein foods Skinless chicken or [place]. Ground chicken or [place]. Pork with fat trimmed off. Fish and seafood. Egg whites. Dried beans, peas, or lentils. Unsalted nuts, nut butters, and seeds. Unsalted canned beans. Lean cuts of beef with fat trimmed off. Low-sodium, lean deli meat. Dairy Low-fat (1%) or fat-free (skim) milk. Fat-free, low-fat, or reduced-fat cheeses. Nonfat, low-sodium ricotta or cottage cheese. Low-fat or nonfat yogurt. Low-fat, low-sodium cheese. Fats and oils Soft margarine without trans fats. Vegetable oil. Low-fat, reduced-fat, or light mayonnaise and salad dressings (reduced-sodium). Canola, safflower, olive, soybean, and sunflower oils. Avocado. Seasoning and other foods Herbs. Spices. Seasoning mixes without salt. Unsalted popcorn and pretzels. Fat-free sweets. What foods are not recommended? The items listed may not be a complete list. Talk with your dietitian about what dietary choices are best for you. Grains Baked goods made with fat, such as croissants, muffins, or some breads. Dry pasta or rice meal packs. Vegetables Creamed or fried vegetables. Vegetables in a cheese sauce. Regular canned vegetables (not low-sodium or reduced-sodium). Regular canned tomato sauce and paste (not low-sodium or reduced-sodium). Regular tomato and vegetable juice (not low-sodium or reduced-sodium). Angie Fava. Olives. Fruits Canned fruit in a light or heavy syrup. Fried fruit. Fruit in cream or butter sauce. Meat and other protein foods Fatty cuts of meat. Ribs. Fried meat. Berniece Salines. Sausage. Bologna and other processed lunch meats. Salami. Fatback. Hotdogs. Bratwurst. Salted nuts and seeds. Canned beans with added salt. Canned or smoked fish. Whole eggs or egg yolks. Chicken or [place] with skin. Dairy Whole or 2% milk, cream, and half-and-half. Whole or full-fat  cream cheese. Whole-fat or sweetened yogurt. Full-fat cheese. Nondairy creamers. Whipped toppings. Processed cheese and cheese spreads. Fats and oils Butter. Stick margarine. Lard. Shortening. Ghee. Bacon fat. Tropical oils, such as coconut, palm kernel, or palm oil. Seasoning and other foods Salted popcorn and pretzels. Onion salt, garlic salt, seasoned salt, table salt, and sea salt. Worcestershire sauce. Tartar sauce. Barbecue sauce. Teriyaki sauce. Soy sauce, including reduced-sodium. Steak sauce. Canned and packaged gravies. Fish sauce. Oyster sauce. Cocktail sauce. Horseradish that you find on the shelf. Ketchup. Mustard. Meat flavorings and tenderizers. Bouillon cubes. Hot sauce and Tabasco sauce. Premade or packaged marinades. Premade or packaged taco seasonings. Relishes. Regular salad dressings. Where to find more information:  National Heart, Lung, and [place]: https://wilson-eaton.com/  American Heart Association: www.heart.org Summary  The DASH eating plan is a healthy eating plan that has been shown to reduce high blood pressure (hypertension). It may also reduce your risk for type 2 diabetes, heart disease, and stroke.  With the DASH eating plan, you should limit salt (sodium) intake to 2,300 mg a day. If you have hypertension, you may need to reduce your sodium intake to 1,500 mg a day.  When on the DASH eating plan, aim to eat more fresh fruits and vegetables, whole grains, lean proteins, low-fat dairy, and heart-healthy fats.  Work with your  health care provider or diet and nutrition specialist (dietitian) to adjust your eating plan to your individual calorie needs. This information is not intended to replace advice given to you by your health care provider. Make sure you discuss any questions you have with your health care provider. Document Revised: 02/19/2017 Document Reviewed: 03/02/2016 Elsevier Patient Education  2020 [employer].

## 2019-11-06 NOTE — Assessment & Plan Note (Addendum)
Chronic, ongoing with BP elevated above goal in office and at home.  No red flag symptoms.   Has underlying circulation issues and have recommended he wear compression hose at home + decrease sodium intake.  Continue current medication regimen with exception of Clonidine will try discontinuing this as not taking it BID (or at all) and reports fatigue with it, change to Hydralazine 10 MG BID + continue collaboration with cardiology and [place] PCP. Wrote directions down for him as he is a poor historian and does not always follow instructions well.  Recommend he bring all medications to his provider visits and document all BP and BS readings, which should bring with him.  Continue to check BP daily at home and document.  Return in September as schedule and obtain BMP at this visit.

## 2019-11-13 ENCOUNTER — Encounter: Payer: Self-pay | Admitting: Emergency Medicine

## 2019-11-13 ENCOUNTER — Other Ambulatory Visit: Payer: Self-pay

## 2019-11-13 ENCOUNTER — Emergency Department: Payer: Medicare Other

## 2019-11-13 ENCOUNTER — Emergency Department
Admission: EM | Admit: 2019-11-13 | Discharge: 2019-11-14 | Disposition: A | Discharge status: Home / Self Care | Payer: Medicare Other | Attending: Emergency Medicine | Admitting: Emergency Medicine

## 2019-11-13 DIAGNOSIS — I161 Hypertensive emergency: Secondary | ICD-10-CM | POA: Diagnosis not present

## 2019-11-13 DIAGNOSIS — R262 Difficulty in walking, not elsewhere classified: Secondary | ICD-10-CM | POA: Diagnosis not present

## 2019-11-13 DIAGNOSIS — G250 Essential tremor: Secondary | ICD-10-CM | POA: Diagnosis not present

## 2019-11-13 DIAGNOSIS — R52 Pain, unspecified: Secondary | ICD-10-CM | POA: Diagnosis not present

## 2019-11-13 DIAGNOSIS — Z5321 Procedure and treatment not carried out due to patient leaving prior to being seen by health care provider: Secondary | ICD-10-CM | POA: Insufficient documentation

## 2019-11-13 DIAGNOSIS — G4489 Other headache syndrome: Secondary | ICD-10-CM | POA: Diagnosis not present

## 2019-11-13 DIAGNOSIS — G4733 Obstructive sleep apnea (adult) (pediatric): Secondary | ICD-10-CM | POA: Diagnosis not present

## 2019-11-13 DIAGNOSIS — Z20822 Contact with and (suspected) exposure to covid-19: Secondary | ICD-10-CM | POA: Diagnosis not present

## 2019-11-13 DIAGNOSIS — Z9989 Dependence on other enabling machines and devices: Secondary | ICD-10-CM | POA: Diagnosis not present

## 2019-11-13 DIAGNOSIS — R0902 Hypoxemia: Secondary | ICD-10-CM | POA: Diagnosis not present

## 2019-11-13 DIAGNOSIS — G43909 Migraine, unspecified, not intractable, without status migrainosus: Secondary | ICD-10-CM | POA: Diagnosis present

## 2019-11-13 DIAGNOSIS — R110 Nausea: Secondary | ICD-10-CM | POA: Diagnosis not present

## 2019-11-13 DIAGNOSIS — R519 Headache, unspecified: Secondary | ICD-10-CM | POA: Diagnosis not present

## 2019-11-13 DIAGNOSIS — I10 Essential (primary) hypertension: Secondary | ICD-10-CM | POA: Diagnosis not present

## 2019-11-13 LAB — CBC WITH DIFFERENTIAL/PLATELET
Abs Immature Granulocytes: 0.04 10*3/uL (ref 0.00–0.07)
Basophils Absolute: 0.1 10*3/uL (ref 0.0–0.1)
Basophils Relative: 1 %
Eosinophils Absolute: 0.1 10*3/uL (ref 0.0–0.5)
Eosinophils Relative: 1 %
HCT: 44.8 % (ref 39.0–52.0)
Hemoglobin: 15.5 g/dL (ref 13.0–17.0)
Immature Granulocytes: 0 %
Lymphocytes Relative: 12 %
Lymphs Abs: 1.1 10*3/uL (ref 0.7–4.0)
MCH: 31.3 pg (ref 26.0–34.0)
MCHC: 34.6 g/dL (ref 30.0–36.0)
MCV: 90.5 fL (ref 80.0–100.0)
Monocytes Absolute: 0.6 10*3/uL (ref 0.1–1.0)
Monocytes Relative: 6 %
Neutro Abs: 7.2 10*3/uL (ref 1.7–7.7)
Neutrophils Relative %: 80 %
Platelets: 206 10*3/uL (ref 150–400)
RBC: 4.95 MIL/uL (ref 4.22–5.81)
RDW: 13.1 % (ref 11.5–15.5)
WBC: 9 10*3/uL (ref 4.0–10.5)
nRBC: 0 % (ref 0.0–0.2)

## 2019-11-13 LAB — COMPREHENSIVE METABOLIC PANEL
ALT: 20 U/L (ref 0–44)
AST: 20 U/L (ref 15–41)
Albumin: 3.6 g/dL (ref 3.5–5.0)
Alkaline Phosphatase: 48 U/L (ref 38–126)
Anion gap: 8 (ref 5–15)
BUN: 9 mg/dL (ref 8–23)
CO2: 29 mmol/L (ref 22–32)
Calcium: 9.8 mg/dL (ref 8.9–10.3)
Chloride: 98 mmol/L (ref 98–111)
Creatinine, Ser: 0.78 mg/dL (ref 0.61–1.24)
GFR calc Af Amer: >60 mL/min (ref >60)
GFR calc non Af Amer: >60 mL/min (ref >60)
Glucose, Bld: 186 mg/dL — ABNORMAL HIGH (ref 70–99)
Potassium: 4.4 mmol/L (ref 3.5–5.1)
Sodium: 135 mmol/L (ref 135–145)
Total Bilirubin: 1 mg/dL (ref 0.3–1.2)
Total Protein: 7.4 g/dL (ref 6.5–8.1)

## 2019-11-13 MED ORDER — BUTALBITAL-APAP-CAFFEINE 50-325-40 MG PO TABS
1.0000 | ORAL_TABLET | Freq: Once | ORAL | Status: AC
  Administered 2019-11-13: 1 via ORAL
  Filled 2019-11-13: qty 1

## 2019-11-13 NOTE — ED Triage Notes (Signed)
Pt presents to ED via [facility] with c/o migraine, pt states worst HA of his life. Pt states pain starts from L eye and radiates over to R eye, pt states pain radiates down the back of his head and into his neck. Pt appears uncomfortable in triage. Pt states pain started at approx 1300 today. Pt A&O x4.

## 2019-11-13 NOTE — ED Notes (Signed)
Pt daughter contacted at patients request. Pt states he does not want to stay and be seen.

## 2019-11-22 DIAGNOSIS — J329 Chronic sinusitis, unspecified: Secondary | ICD-10-CM | POA: Diagnosis not present

## 2019-11-22 DIAGNOSIS — J301 Allergic rhinitis due to pollen: Secondary | ICD-10-CM | POA: Diagnosis not present

## 2019-11-22 DIAGNOSIS — H7201 Central perforation of tympanic membrane, right ear: Secondary | ICD-10-CM | POA: Diagnosis not present

## 2019-11-22 DIAGNOSIS — H6123 Impacted cerumen, bilateral: Secondary | ICD-10-CM | POA: Diagnosis not present

## 2019-11-22 DIAGNOSIS — R519 Headache, unspecified: Secondary | ICD-10-CM | POA: Diagnosis not present

## 2019-11-28 ENCOUNTER — Ambulatory Visit (INDEPENDENT_AMBULATORY_CARE_PROVIDER_SITE_OTHER): Payer: Medicare Other | Admitting: Nurse Practitioner

## 2019-11-28 ENCOUNTER — Encounter: Payer: Self-pay | Admitting: Nurse Practitioner

## 2019-11-28 ENCOUNTER — Other Ambulatory Visit: Payer: Self-pay

## 2019-11-28 VITALS — BP 138/86 | HR 81 | Temp 98.3°F | Wt 295.0 lb

## 2019-11-28 DIAGNOSIS — E1159 Type 2 diabetes mellitus with other circulatory complications: Secondary | ICD-10-CM

## 2019-11-28 DIAGNOSIS — E785 Hyperlipidemia, unspecified: Secondary | ICD-10-CM | POA: Diagnosis not present

## 2019-11-28 DIAGNOSIS — Z23 Encounter for immunization: Secondary | ICD-10-CM

## 2019-11-28 DIAGNOSIS — Z6841 Body Mass Index (BMI) 40.0 and over, adult: Secondary | ICD-10-CM | POA: Diagnosis not present

## 2019-11-28 DIAGNOSIS — E1169 Type 2 diabetes mellitus with other specified complication: Secondary | ICD-10-CM | POA: Diagnosis not present

## 2019-11-28 DIAGNOSIS — Z1159 Encounter for screening for other viral diseases: Secondary | ICD-10-CM | POA: Diagnosis not present

## 2019-11-28 DIAGNOSIS — I10 Essential (primary) hypertension: Secondary | ICD-10-CM | POA: Diagnosis not present

## 2019-11-28 DIAGNOSIS — E1129 Type 2 diabetes mellitus with other diabetic kidney complication: Secondary | ICD-10-CM

## 2019-11-28 DIAGNOSIS — R251 Tremor, unspecified: Secondary | ICD-10-CM

## 2019-11-28 DIAGNOSIS — J449 Chronic obstructive pulmonary disease, unspecified: Secondary | ICD-10-CM | POA: Diagnosis not present

## 2019-11-28 DIAGNOSIS — R809 Proteinuria, unspecified: Secondary | ICD-10-CM | POA: Diagnosis not present

## 2019-11-28 DIAGNOSIS — I152 Hypertension secondary to endocrine disorders: Secondary | ICD-10-CM

## 2019-11-28 MED ORDER — PRIMIDONE 50 MG PO TABS: 50.0000 mg | ORAL_TABLET | Freq: Two times a day (BID) | ORAL | 4 refills | Status: DC

## 2019-11-28 NOTE — Assessment & Plan Note (Signed)
Chronic, ongoing with BP elevated initial, but improved on recheck.  No red flag symptoms.   Has underlying circulation issues and have recommended he wear compression hose at home + decrease sodium intake.  Continue current medication regimen and adjust as needed + continue collaboration with cardiology and [place] PCP.  Recommend he bring all medications to his provider visits and document all BP and BS readings, which should bring with him -- as is poor historian and unsure what he is exactly taking daily at home.  Continue to check BP daily at home and document.  Return in 3 months.  BMP and TSH today.

## 2019-11-28 NOTE — Assessment & Plan Note (Signed)
BMI 41.14 with T2DM and HTN.  Recommended eating smaller high protein, low fat meals more frequently and exercising 30 mins a day 5 times a week with a goal of 10-15lb weight loss in the next 3 months. Patient voiced their understanding and motivation to adhere to these recommendations.

## 2019-11-28 NOTE — Progress Notes (Signed)
BP 138/86 (BP Location: Left Arm)   Pulse 81   Temp 98.3 F (36.8 C) (Oral)   Wt 295 lb (133.8 kg)   SpO2 95%   BMI 41.14 kg/m    Subjective:    Patient ID: Justin Griffith, male    DOB: 1940-11-29, 79 y.o.   MRN: [id number]  HPI: Justin Griffith is a 79 y.o. male  Chief Complaint  Patient presents with  . Diabetes  . Hypertension  . Hyperlipidemia  . COPD   He is scheduled to see [place] PCP every few months -- February is next visit.  Had a recent bad experience with an eye exam at [facility], which caused headaches afterwards.    HYPERTENSION/HLD Continues onOlmesartan 40 MG, Hydralazine, HCTZ 25 MG, Carvedilol,Lasix 40 MG,and ASA+ Crestor. He is poor historian.He reports stopped to taking Hydralazine and went back to Clonidine twice a Griffith.  Sees [name] for cardiology, saw last in 10/04/19 when BP was 128/82 and they reduced his Carvedilol to 12.5 MG BID due to lower BP and HR -- he reports continuing to take 25 MG BID. Last EF >55% 06/22/2018. Hypertension status: uncontrolled  Satisfied with current treatment? yes Duration of hypertension: chronic BP monitoring frequency:  waiting on new cuff BP range:  BP medication side effects:  no Medication compliance: good compliance Previous BP meds: Losartan, Clonidine, HCTZ, Carvedilol, Amlodipine, Lasix Aspirin: yes Recurrent headaches: no Visual changes: no Palpitations: no Dyspnea:  At baseline Chest pain: no Lower extremity edema: mild, baseline Dizzy/lightheaded: no   COPD Followed by [name], last seen 02/22/2019.  Appears Prednisone was added 5 MG daily and Tessalon.  He reports no worsening cough or SOB.   COPD status: stable Satisfied with current treatment?: yes Oxygen use: no Dyspnea frequency: at baseline Cough frequency: present, chronic Rescue inhaler frequency:  Uses Duoneb QID Limitation of activity: at times Productive cough: at this time, over past week Last Spirometry: with  pulmonary Pneumovax: Up to Date Influenza: Up to Date  DIABETES Last saw endocrinology July 2021 and A1C 8.3%.  Is to have eye exam at [facility] when able.  Does report he is working on weight loss and diet changes, eating less sweets and bread which are his favorites.  He stopped taking Novolog as he feels this was causing swelling in legs, when he stopped taking swelling went away. Hypoglycemic episodes:no Polydipsia/polyuria: yes Visual disturbance: no Chest pain: no Paresthesias: no Glucose Monitoring: yes  Accucheck frequency: Daily  Fasting glucose: 160-180's  Post prandial:  Evening: 125-130's  Before meals: Taking Insulin?: no  Long acting insulin: Lantus 80 units  Short acting insulin: Blood Pressure Monitoring: daily Retinal Examination: Not up to Date Foot Exam: Up to Date Pneumovax: Up to Date Influenza: Up to Date Aspirin: yes   ESSENTIAL TREMOR Saw neurology on 11/13/19 was started on Primidone, which he reports he did not get enough tablets to take twice a Griffith + they ordered PT and EMG.  Continues Gabapentin for essential tremor.  Patient continues to have concerns about Parkinson's, as his father had this.  He is going to speak to his [place] PCP today. 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  No combat time.  Did spend time overseas in [place], possible exposures.  Relevant past medical, surgical, family and social history reviewed and updated as indicated. Interim medical history since our last visit reviewed. Allergies and medications reviewed and updated.  Review of Systems  Constitutional: Negative for activity change,  diaphoresis, fatigue and fever.  Respiratory: Positive for cough (baseline) and shortness of breath (baseline). Negative for chest tightness and wheezing.   Cardiovascular: Positive for leg swelling (baseline, improved). Negative for chest pain and palpitations.  Endocrine: Negative for cold intolerance, heat intolerance, polydipsia, polyphagia and  polyuria.  Neurological: Negative for dizziness, tremors, syncope, weakness, light-headedness, numbness and headaches.  Psychiatric/Behavioral: Negative.     Per HPI unless specifically indicated above     Objective:    BP 138/86 (BP Location: Left Arm)   Pulse 81   Temp 98.3 F (36.8 C) (Oral)   Wt 295 lb (133.8 kg)   SpO2 95%   BMI 41.14 kg/m   Wt Readings from Last 3 Encounters:  11/28/19 295 lb (133.8 kg)  11/13/19 300 lb (136.1 kg)  11/06/19 294 lb 9.6 oz (133.6 kg)    Physical Exam Vitals and nursing note reviewed.  Constitutional:      General: He is awake. He is not in acute distress.    Appearance: He is well-developed. He is morbidly obese. He is not ill-appearing.  HENT:     Head: Normocephalic and atraumatic.     Right Ear: Hearing, tympanic membrane, ear canal and external ear normal. No drainage.     Left Ear: Hearing, tympanic membrane, ear canal and external ear normal. No drainage.     Mouth/Throat:     Pharynx: Uvula midline. No pharyngeal swelling, oropharyngeal exudate or posterior oropharyngeal erythema.  Eyes:     General: Lids are normal.        Right eye: No discharge.        Left eye: No discharge.     Conjunctiva/sclera: Conjunctivae normal.     Pupils: Pupils are equal, round, and reactive to light.  Neck:     Thyroid: No thyromegaly.     Vascular: No carotid bruit or JVD.  Cardiovascular:     Rate and Rhythm: Normal rate and regular rhythm.     Heart sounds: Normal heart sounds, S1 normal and S2 normal. No murmur heard.  No gallop.   Pulmonary:     Effort: Pulmonary effort is normal. No accessory muscle usage or respiratory distress.     Breath sounds: Decreased breath sounds present. No wheezing.     Comments: Decreased breath sounds throughout, no whezing.  No SOB with talking or when walked from office door to exam room. Abdominal:     General: Bowel sounds are normal.     Palpations: Abdomen is soft. There is no hepatomegaly or  splenomegaly.     Tenderness: There is no abdominal tenderness.  Musculoskeletal:        General: Normal range of motion.     Cervical back: Normal range of motion and neck supple.     Right lower leg: Edema (trace) present.     Left lower leg: Edema (trace) present.  Lymphadenopathy:     Cervical: No cervical adenopathy.  Skin:    General: Skin is warm and dry.     Capillary Refill: Capillary refill takes less than 2 seconds.     Comments: Rubor and xerosis bilateral lower extremity.  Neurological:     Mental Status: He is alert and oriented to person, place, and time.  Psychiatric:        Mood and Affect: Mood normal.        Behavior: Behavior normal. Behavior is cooperative.        Thought Content: Thought content normal.  Judgment: Judgment normal.    Diabetic Foot Exam - Simple   No data filed      Results for orders placed or performed during the hospital encounter of 11/13/19  CBC with Differential  Result Value Ref Range   WBC 9.0 4.0 - 10.5 K/uL   RBC 4.95 4.22 - 5.81 MIL/uL   Hemoglobin 15.5 13.0 - 17.0 g/dL   HCT 44.8 39 - 52 %   MCV 90.5 80.0 - 100.0 fL   MCH 31.3 26.0 - 34.0 pg   MCHC 34.6 30.0 - 36.0 g/dL   RDW 13.1 11.5 - 15.5 %   Platelets 206 150 - 400 K/uL   nRBC 0.0 0.0 - 0.2 %   Neutrophils Relative % 80 %   Neutro Abs 7.2 1.7 - 7.7 K/uL   Lymphocytes Relative 12 %   Lymphs Abs 1.1 0.7 - 4.0 K/uL   Monocytes Relative 6 %   Monocytes Absolute 0.6 0 - 1 K/uL   Eosinophils Relative 1 %   Eosinophils Absolute 0.1 0 - 0 K/uL   Basophils Relative 1 %   Basophils Absolute 0.1 0 - 0 K/uL   Immature Granulocytes 0 %   Abs Immature Granulocytes 0.04 0.00 - 0.07 K/uL  Comprehensive metabolic panel  Result Value Ref Range   Sodium 135 135 - 145 mmol/L   Potassium 4.4 3.5 - 5.1 mmol/L   Chloride 98 98 - 111 mmol/L   CO2 29 22 - 32 mmol/L   Glucose, Bld 186 (H) 70 - 99 mg/dL   BUN 9 8 - 23 mg/dL   Creatinine, Ser 0.78 0.61 - 1.24 mg/dL    Calcium 9.8 8.9 - 10.3 mg/dL   Total Protein 7.4 6.5 - 8.1 g/dL   Albumin 3.6 3.5 - 5.0 g/dL   AST 20 15 - 41 U/L   ALT 20 0 - 44 U/L   Alkaline Phosphatase 48 38 - 126 U/L   Total Bilirubin 1.0 0.3 - 1.2 mg/dL   GFR calc non Af Amer >60 >60 mL/min   GFR calc Af Amer >60 >60 mL/min   Anion gap 8 5 - 15      Assessment & Plan:   Problem List Items Addressed This Visit      Cardiovascular and Mediastinum   Hypertension associated with diabetes ([place])    Chronic, ongoing with BP elevated initial, but improved on recheck.  No red flag symptoms.   Has underlying circulation issues and have recommended he wear compression hose at home + decrease sodium intake.  Continue current medication regimen and adjust as needed + continue collaboration with cardiology and [place] PCP.  Recommend he bring all medications to his provider visits and document all BP and BS readings, which should bring with him -- as is poor historian and unsure what he is exactly taking daily at home.  Continue to check BP daily at home and document.  Return in 3 months.  BMP and TSH today.      Relevant Orders   Basic metabolic panel   TSH     Respiratory   COPD (chronic obstructive pulmonary disease) (HCC)    Chronic, ongoing.  Followed by pulmonary.  Continue current medication regimen as prescribed by them and adjust as needed.  Continue Mucinex at home as needed.        Relevant Medications   azelastine (ASTELIN) 0.1 % nasal spray     Endocrine   Type 2 diabetes mellitus with proteinuria ([place]) -  Primary    Chronic, ongoing with poor control.  Followed by endocrinology with recent A1C 8.3%, January urine ALB 150 and A:C >300, continue Olmesartan for kidney protection.  Continue current collaboration and medication regimen as prescribed by them.  Recommend focus on modest weight loss and diet changes.  Review upcoming endocrinology notes.  Continue collaboration with CCM team to assist in ongoing education about diabetes.   Return in 3 months.      Hyperlipidemia associated with type 2 diabetes mellitus (HCC)    Chronic, ongoing.  Continue current medication regimen and adjust as needed.  Lipid panel today, recent LDL 31.      Relevant Orders   Lipid Panel w/o Chol/HDL Ratio     Other   BMI 40.0-44.9, adult ([place])    Refer to morbid obesity plan, continue focus on healthy diet and exercise regimen.      Morbid obesity (HCC)    BMI 41.14 with T2DM and HTN.  Recommended eating smaller high protein, low fat meals more frequently and exercising 30 mins a Griffith 5 times a week with a goal of 10-15lb weight loss in the next 3 months. Patient voiced their understanding and motivation to adhere to these recommendations.       Tremor    Chronic, ongoing, followed by neurology.  Continue this collaboration and recommended he reach out to them about his recent PT referral which he has not heard from.  Will send in refills on the Primidone they prescribed, as he reports he does not have enough for twice a Griffith dosing they recommended.  Return in 3 months.       Other Visit Diagnoses    Need for hepatitis C screening test       Relevant Orders   Hepatitis C antibody   Flu vaccine need       Relevant Orders   Flu Vaccine QUAD High Dose(Fluad) (Completed)       Follow up plan: Return in about 3 months (around 02/27/2020) for T2DM, HTN/HLD, COPD.

## 2019-11-28 NOTE — Assessment & Plan Note (Signed)
Refer to morbid obesity plan, continue focus on healthy diet and exercise regimen. °

## 2019-11-28 NOTE — Patient Instructions (Addendum)
CALL NEUROLOGY ABOUT PHYSICAL THERAPY REFERRAL -- [phone] -- LET THEM KNOW YOU HAVE NOT HEARD FROM PHYSICAL THERAPY  DASH Eating Plan DASH stands for "Dietary Approaches to Stop Hypertension." The DASH eating plan is a healthy eating plan that has been shown to reduce high blood pressure (hypertension). It may also reduce your risk for type 2 diabetes, heart disease, and stroke. The DASH eating plan may also help with weight loss. What are tips for following this plan?  General guidelines  Avoid eating more than 2,300 mg (milligrams) of salt (sodium) a day. If you have hypertension, you may need to reduce your sodium intake to 1,500 mg a day.  Limit alcohol intake to no more than 1 drink a day for nonpregnant women and 2 drinks a day for men. One drink equals 12 oz of beer, 5 oz of wine, or 1 oz of hard liquor.  Work with your health care provider to maintain a healthy body weight or to lose weight. Ask what an ideal weight is for you.  Get at least 30 minutes of exercise that causes your heart to beat faster (aerobic exercise) most days of the week. Activities may include walking, swimming, or biking.  Work with your health care provider or diet and nutrition specialist (dietitian) to adjust your eating plan to your individual calorie needs. Reading food labels   Check food labels for the amount of sodium per serving. Choose foods with less than 5 percent of the Daily Value of sodium. Generally, foods with less than 300 mg of sodium per serving fit into this eating plan.  To find whole grains, look for the word "whole" as the first word in the ingredient list. Shopping  Buy products labeled as "low-sodium" or "no salt added."  Buy fresh foods. Avoid canned foods and premade or frozen meals. Cooking  Avoid adding salt when cooking. Use salt-free seasonings or herbs instead of table salt or sea salt. Check with your health care provider or pharmacist before using salt  substitutes.  Do not fry foods. Cook foods using healthy methods such as baking, boiling, grilling, and broiling instead.  Cook with heart-healthy oils, such as olive, canola, soybean, or sunflower oil. Meal planning  Eat a balanced diet that includes: ? 5 or more servings of fruits and vegetables each day. At each meal, try to fill half of your plate with fruits and vegetables. ? Up to 6-8 servings of whole grains each day. ? Less than 6 oz of lean meat, poultry, or fish each day. A 3-oz serving of meat is about the same size as a deck of cards. One egg equals 1 oz. ? 2 servings of low-fat dairy each day. ? A serving of nuts, seeds, or beans 5 times each week. ? Heart-healthy fats. Healthy fats called Omega-3 fatty acids are found in foods such as flaxseeds and coldwater fish, like sardines, salmon, and mackerel.  Limit how much you eat of the following: ? Canned or prepackaged foods. ? Food that is high in trans fat, such as fried foods. ? Food that is high in saturated fat, such as fatty meat. ? Sweets, desserts, sugary drinks, and other foods with added sugar. ? Full-fat dairy products.  Do not salt foods before eating.  Try to eat at least 2 vegetarian meals each week.  Eat more home-cooked food and less restaurant, buffet, and fast food.  When eating at a restaurant, ask that your food be prepared with less salt or no  no salt, if possible. °What foods are recommended? °The items listed may not be a complete list. Talk with your dietitian about what dietary choices are best for you. °Grains °Whole-grain or whole-wheat bread. Whole-grain or whole-wheat pasta. Brown rice. Oatmeal. Quinoa. Bulgur. Whole-grain and low-sodium cereals. Pita bread. Low-fat, low-sodium crackers. Whole-wheat flour tortillas. °Vegetables °Fresh or frozen vegetables (raw, steamed, roasted, or grilled). Low-sodium or reduced-sodium tomato and vegetable juice. Low-sodium or reduced-sodium tomato sauce and tomato  paste. Low-sodium or reduced-sodium canned vegetables. °Fruits °All fresh, dried, or frozen fruit. Canned fruit in natural juice (without added sugar). °Meat and other protein foods °Skinless chicken or turkey. Ground chicken or turkey. Pork with fat trimmed off. Fish and seafood. Egg whites. Dried beans, peas, or lentils. Unsalted nuts, nut butters, and seeds. Unsalted canned beans. Lean cuts of beef with fat trimmed off. Low-sodium, lean deli meat. °Dairy °Low-fat (1%) or fat-free (skim) milk. Fat-free, low-fat, or reduced-fat cheeses. Nonfat, low-sodium ricotta or cottage cheese. Low-fat or nonfat yogurt. Low-fat, low-sodium cheese. °Fats and oils °Soft margarine without trans fats. Vegetable oil. Low-fat, reduced-fat, or light mayonnaise and salad dressings (reduced-sodium). Canola, safflower, olive, soybean, and sunflower oils. Avocado. °Seasoning and other foods °Herbs. Spices. Seasoning mixes without salt. Unsalted popcorn and pretzels. Fat-free sweets. °What foods are not recommended? °The items listed may not be a complete list. Talk with your dietitian about what dietary choices are best for you. °Grains °Baked goods made with fat, such as croissants, muffins, or some breads. Dry pasta or rice meal packs. °Vegetables °Creamed or fried vegetables. Vegetables in a cheese sauce. Regular canned vegetables (not low-sodium or reduced-sodium). Regular canned tomato sauce and paste (not low-sodium or reduced-sodium). Regular tomato and vegetable juice (not low-sodium or reduced-sodium). Pickles. Olives. °Fruits °Canned fruit in a light or heavy syrup. Fried fruit. Fruit in cream or butter sauce. °Meat and other protein foods °Fatty cuts of meat. Ribs. Fried meat. Bacon. Sausage. Bologna and other processed lunch meats. Salami. Fatback. Hotdogs. Bratwurst. Salted nuts and seeds. Canned beans with added salt. Canned or smoked fish. Whole eggs or egg yolks. Chicken or turkey with skin. °Dairy °Whole or 2% milk,  cream, and half-and-half. Whole or full-fat cream cheese. Whole-fat or sweetened yogurt. Full-fat cheese. Nondairy creamers. Whipped toppings. Processed cheese and cheese spreads. °Fats and oils °Butter. Stick margarine. Lard. Shortening. Ghee. Bacon fat. Tropical oils, such as coconut, palm kernel, or palm oil. °Seasoning and other foods °Salted popcorn and pretzels. Onion salt, garlic salt, seasoned salt, table salt, and sea salt. Worcestershire sauce. Tartar sauce. Barbecue sauce. Teriyaki sauce. Soy sauce, including reduced-sodium. Steak sauce. Canned and packaged gravies. Fish sauce. Oyster sauce. Cocktail sauce. Horseradish that you find on the shelf. Ketchup. Mustard. Meat flavorings and tenderizers. Bouillon cubes. Hot sauce and Tabasco sauce. Premade or packaged marinades. Premade or packaged taco seasonings. Relishes. Regular salad dressings. °Where to find more information: °· National Heart, Lung, and Blood Institute: www.nhlbi.nih.gov °· American Heart Association: www.heart.org °Summary °· The DASH eating plan is a healthy eating plan that has been shown to reduce high blood pressure (hypertension). It may also reduce your risk for type 2 diabetes, heart disease, and stroke. °· With the DASH eating plan, you should limit salt (sodium) intake to 2,300 mg a day. If you have hypertension, you may need to reduce your sodium intake to 1,500 mg a day. °· When on the DASH eating plan, aim to eat more fresh fruits and vegetables, whole grains, lean proteins, low-fat dairy, and   fats.  Work with your health care provider or diet and nutrition specialist (dietitian) to adjust your eating plan to your individual calorie needs. This information is not intended to replace advice given to you by your health care provider. Make sure you discuss any questions you have with your health care provider. Document Revised: 02/19/2017 Document Reviewed: 03/02/2016 Elsevier Patient Education  2020 [employer].

## 2019-11-28 NOTE — Assessment & Plan Note (Signed)
Chronic, ongoing.  Continue current medication regimen and adjust as needed.  Lipid panel today, recent LDL 31. °

## 2019-11-28 NOTE — Assessment & Plan Note (Signed)
Chronic, ongoing with poor control.  Followed by endocrinology with recent A1C 8.3%, January urine ALB 150 and A:C >300, continue Olmesartan for kidney protection.  Continue current collaboration and medication regimen as prescribed by them.  Recommend focus on modest weight loss and diet changes.  Review upcoming endocrinology notes.  Continue collaboration with CCM team to assist in ongoing education about diabetes.  Return in 3 months.

## 2019-11-28 NOTE — Assessment & Plan Note (Signed)
Chronic, ongoing, followed by neurology.  Continue this collaboration and recommended he reach out to them about his recent PT referral which he has not heard from.  Will send in refills on the Primidone they prescribed, as he reports he does not have enough for twice a day dosing they recommended.  Return in 3 months.

## 2019-11-28 NOTE — Assessment & Plan Note (Signed)
Chronic, ongoing.  Followed by pulmonary.  Continue current medication regimen as prescribed by them and adjust as needed.  Continue Mucinex at home as needed.

## 2019-11-29 LAB — BASIC METABOLIC PANEL
BUN/Creatinine Ratio: 13 (ref 10–24)
BUN: 12 mg/dL (ref 8–27)
CO2: 27 mmol/L (ref 20–29)
Calcium: 9.9 mg/dL (ref 8.6–10.2)
Chloride: 100 mmol/L (ref 96–106)
Creatinine, Ser: 0.96 mg/dL (ref 0.76–1.27)
GFR calc Af Amer: 87 mL/min/{1.73_m2} (ref >59)
GFR calc non Af Amer: 75 mL/min/{1.73_m2} (ref >59)
Glucose: 204 mg/dL — ABNORMAL HIGH (ref 65–99)
Potassium: 4.3 mmol/L (ref 3.5–5.2)
Sodium: 138 mmol/L (ref 134–144)

## 2019-11-29 LAB — LIPID PANEL W/O CHOL/HDL RATIO
Cholesterol, Total: 134 mg/dL (ref 100–199)
HDL: 41 mg/dL (ref >39)
LDL Chol Calc (NIH): 63 mg/dL (ref 0–99)
Triglycerides: 179 mg/dL — ABNORMAL HIGH (ref 0–149)
VLDL Cholesterol Cal: 30 mg/dL (ref 5–40)

## 2019-11-29 LAB — HEPATITIS C ANTIBODY: Hep C Virus Ab: 0.8 s/co ratio (ref 0.0–0.9)

## 2019-11-29 LAB — TSH: TSH: 2.77 u[IU]/mL (ref 0.450–4.500)

## 2019-11-29 NOTE — Progress Notes (Signed)
Please let Justin Griffith know his labs have returned and overall look good.  Hep C is negative.  We can continue current medication regimen.  Have a great day!!

## 2019-11-29 NOTE — Progress Notes (Signed)
Please let Mr. Mould know his labs have returned and overall look good.  Hep C is negative.  We can continue current medication regimen.  Have a great day!!

## 2019-12-14 ENCOUNTER — Emergency Department: Payer: Medicare Other

## 2019-12-14 ENCOUNTER — Observation Stay: Payer: Medicare Other

## 2019-12-14 ENCOUNTER — Ambulatory Visit: Payer: Medicare Other | Admitting: Physical Therapy

## 2019-12-14 ENCOUNTER — Inpatient Hospital Stay
Admission: EM | Admit: 2019-12-14 | Discharge: 2019-12-17 | DRG: 071 | Disposition: A | Discharge status: Home Health | Payer: Medicare Other | Attending: Internal Medicine | Admitting: Internal Medicine

## 2019-12-14 DIAGNOSIS — N401 Enlarged prostate with lower urinary tract symptoms: Secondary | ICD-10-CM | POA: Diagnosis not present

## 2019-12-14 DIAGNOSIS — E1159 Type 2 diabetes mellitus with other circulatory complications: Secondary | ICD-10-CM | POA: Diagnosis present

## 2019-12-14 DIAGNOSIS — G319 Degenerative disease of nervous system, unspecified: Secondary | ICD-10-CM | POA: Diagnosis not present

## 2019-12-14 DIAGNOSIS — Z7982 Long term (current) use of aspirin: Secondary | ICD-10-CM

## 2019-12-14 DIAGNOSIS — H748X3 Other specified disorders of middle ear and mastoid, bilateral: Secondary | ICD-10-CM | POA: Diagnosis not present

## 2019-12-14 DIAGNOSIS — J90 Pleural effusion, not elsewhere classified: Secondary | ICD-10-CM | POA: Diagnosis present

## 2019-12-14 DIAGNOSIS — E1169 Type 2 diabetes mellitus with other specified complication: Secondary | ICD-10-CM | POA: Diagnosis not present

## 2019-12-14 DIAGNOSIS — I248 Other forms of acute ischemic heart disease: Secondary | ICD-10-CM | POA: Diagnosis present

## 2019-12-14 DIAGNOSIS — Z6838 Body mass index (BMI) 38.0-38.9, adult: Secondary | ICD-10-CM

## 2019-12-14 DIAGNOSIS — J439 Emphysema, unspecified: Secondary | ICD-10-CM | POA: Diagnosis present

## 2019-12-14 DIAGNOSIS — R55 Syncope and collapse: Secondary | ICD-10-CM | POA: Diagnosis not present

## 2019-12-14 DIAGNOSIS — I152 Hypertension secondary to endocrine disorders: Secondary | ICD-10-CM | POA: Diagnosis present

## 2019-12-14 DIAGNOSIS — E785 Hyperlipidemia, unspecified: Secondary | ICD-10-CM | POA: Diagnosis present

## 2019-12-14 DIAGNOSIS — R17 Unspecified jaundice: Secondary | ICD-10-CM | POA: Diagnosis present

## 2019-12-14 DIAGNOSIS — R4182 Altered mental status, unspecified: Secondary | ICD-10-CM

## 2019-12-14 DIAGNOSIS — W19XXXA Unspecified fall, initial encounter: Secondary | ICD-10-CM | POA: Diagnosis present

## 2019-12-14 DIAGNOSIS — N138 Other obstructive and reflux uropathy: Secondary | ICD-10-CM | POA: Diagnosis present

## 2019-12-14 DIAGNOSIS — Z79899 Other long term (current) drug therapy: Secondary | ICD-10-CM

## 2019-12-14 DIAGNOSIS — M79606 Pain in leg, unspecified: Secondary | ICD-10-CM

## 2019-12-14 DIAGNOSIS — R809 Proteinuria, unspecified: Secondary | ICD-10-CM | POA: Diagnosis present

## 2019-12-14 DIAGNOSIS — I6782 Cerebral ischemia: Secondary | ICD-10-CM | POA: Diagnosis not present

## 2019-12-14 DIAGNOSIS — Z96653 Presence of artificial knee joint, bilateral: Secondary | ICD-10-CM | POA: Diagnosis present

## 2019-12-14 DIAGNOSIS — E1129 Type 2 diabetes mellitus with other diabetic kidney complication: Secondary | ICD-10-CM | POA: Diagnosis not present

## 2019-12-14 DIAGNOSIS — Z833 Family history of diabetes mellitus: Secondary | ICD-10-CM

## 2019-12-14 DIAGNOSIS — Z85038 Personal history of other malignant neoplasm of large intestine: Secondary | ICD-10-CM

## 2019-12-14 DIAGNOSIS — R0902 Hypoxemia: Secondary | ICD-10-CM | POA: Diagnosis not present

## 2019-12-14 DIAGNOSIS — G9341 Metabolic encephalopathy: Secondary | ICD-10-CM | POA: Diagnosis not present

## 2019-12-14 DIAGNOSIS — Z888 Allergy status to other drugs, medicaments and biological substances status: Secondary | ICD-10-CM

## 2019-12-14 DIAGNOSIS — R569 Unspecified convulsions: Secondary | ICD-10-CM | POA: Diagnosis not present

## 2019-12-14 DIAGNOSIS — Z86010 Personal history of colonic polyps: Secondary | ICD-10-CM

## 2019-12-14 DIAGNOSIS — J449 Chronic obstructive pulmonary disease, unspecified: Secondary | ICD-10-CM | POA: Diagnosis present

## 2019-12-14 DIAGNOSIS — Z20822 Contact with and (suspected) exposure to covid-19: Secondary | ICD-10-CM | POA: Diagnosis not present

## 2019-12-14 DIAGNOSIS — S0003XA Contusion of scalp, initial encounter: Secondary | ICD-10-CM | POA: Diagnosis present

## 2019-12-14 DIAGNOSIS — I7090 Unspecified atherosclerosis: Secondary | ICD-10-CM | POA: Diagnosis not present

## 2019-12-14 DIAGNOSIS — G40909 Epilepsy, unspecified, not intractable, without status epilepticus: Secondary | ICD-10-CM | POA: Diagnosis not present

## 2019-12-14 DIAGNOSIS — E1151 Type 2 diabetes mellitus with diabetic peripheral angiopathy without gangrene: Secondary | ICD-10-CM | POA: Diagnosis present

## 2019-12-14 DIAGNOSIS — R778 Other specified abnormalities of plasma proteins: Secondary | ICD-10-CM | POA: Diagnosis present

## 2019-12-14 DIAGNOSIS — G250 Essential tremor: Secondary | ICD-10-CM | POA: Diagnosis present

## 2019-12-14 DIAGNOSIS — Z794 Long term (current) use of insulin: Secondary | ICD-10-CM

## 2019-12-14 DIAGNOSIS — R350 Frequency of micturition: Secondary | ICD-10-CM | POA: Diagnosis not present

## 2019-12-14 DIAGNOSIS — I10 Essential (primary) hypertension: Secondary | ICD-10-CM | POA: Diagnosis not present

## 2019-12-14 DIAGNOSIS — G4733 Obstructive sleep apnea (adult) (pediatric): Secondary | ICD-10-CM | POA: Diagnosis present

## 2019-12-14 DIAGNOSIS — E669 Obesity, unspecified: Secondary | ICD-10-CM | POA: Diagnosis present

## 2019-12-14 DIAGNOSIS — R404 Transient alteration of awareness: Secondary | ICD-10-CM | POA: Diagnosis not present

## 2019-12-14 DIAGNOSIS — Z87891 Personal history of nicotine dependence: Secondary | ICD-10-CM

## 2019-12-14 DIAGNOSIS — I6389 Other cerebral infarction: Secondary | ICD-10-CM | POA: Diagnosis not present

## 2019-12-14 DIAGNOSIS — J3489 Other specified disorders of nose and nasal sinuses: Secondary | ICD-10-CM | POA: Diagnosis not present

## 2019-12-14 DIAGNOSIS — Z9049 Acquired absence of other specified parts of digestive tract: Secondary | ICD-10-CM

## 2019-12-14 DIAGNOSIS — I639 Cerebral infarction, unspecified: Secondary | ICD-10-CM

## 2019-12-14 DIAGNOSIS — N400 Enlarged prostate without lower urinary tract symptoms: Secondary | ICD-10-CM | POA: Diagnosis present

## 2019-12-14 LAB — TROPONIN I (HIGH SENSITIVITY)
Troponin I (High Sensitivity): 29 ng/L — ABNORMAL HIGH (ref <18)
Troponin I (High Sensitivity): 27 ng/L — ABNORMAL HIGH (ref <18)
Troponin I (High Sensitivity): 7 ng/L (ref <18)

## 2019-12-14 LAB — GLUCOSE, CAPILLARY
Glucose-Capillary: 207 mg/dL — ABNORMAL HIGH (ref 70–99)
Glucose-Capillary: 170 mg/dL — ABNORMAL HIGH (ref 70–99)
Glucose-Capillary: 135 mg/dL — ABNORMAL HIGH (ref 70–99)

## 2019-12-14 LAB — CBC WITH DIFFERENTIAL/PLATELET
Abs Immature Granulocytes: 0.03 10*3/uL (ref 0.00–0.07)
Basophils Absolute: 0 10*3/uL (ref 0.0–0.1)
Basophils Relative: 1 %
Eosinophils Absolute: 0.1 10*3/uL (ref 0.0–0.5)
Eosinophils Relative: 2 %
HCT: 44.7 % (ref 39.0–52.0)
Hemoglobin: 15.8 g/dL (ref 13.0–17.0)
Immature Granulocytes: 1 %
Lymphocytes Relative: 22 %
Lymphs Abs: 1.3 10*3/uL (ref 0.7–4.0)
MCH: 31.4 pg (ref 26.0–34.0)
MCHC: 35.3 g/dL (ref 30.0–36.0)
MCV: 88.9 fL (ref 80.0–100.0)
Monocytes Absolute: 0.5 10*3/uL (ref 0.1–1.0)
Monocytes Relative: 9 %
Neutro Abs: 3.6 10*3/uL (ref 1.7–7.7)
Neutrophils Relative %: 65 %
Platelets: 200 10*3/uL (ref 150–400)
RBC: 5.03 MIL/uL (ref 4.22–5.81)
RDW: 12.9 % (ref 11.5–15.5)
WBC: 5.6 10*3/uL (ref 4.0–10.5)
nRBC: 0 % (ref 0.0–0.2)

## 2019-12-14 LAB — COMPREHENSIVE METABOLIC PANEL
ALT: 20 U/L (ref 0–44)
AST: 20 U/L (ref 15–41)
Albumin: 3.5 g/dL (ref 3.5–5.0)
Alkaline Phosphatase: 46 U/L (ref 38–126)
Anion gap: 8 (ref 5–15)
BUN: 13 mg/dL (ref 8–23)
CO2: 28 mmol/L (ref 22–32)
Calcium: 10.3 mg/dL (ref 8.9–10.3)
Chloride: 97 mmol/L — ABNORMAL LOW (ref 98–111)
Creatinine, Ser: 0.89 mg/dL (ref 0.61–1.24)
GFR calc Af Amer: >60 mL/min (ref >60)
GFR calc non Af Amer: >60 mL/min (ref >60)
Glucose, Bld: 201 mg/dL — ABNORMAL HIGH (ref 70–99)
Potassium: 3.9 mmol/L (ref 3.5–5.1)
Sodium: 133 mmol/L — ABNORMAL LOW (ref 135–145)
Total Bilirubin: 1.2 mg/dL (ref 0.3–1.2)
Total Protein: 7 g/dL (ref 6.5–8.1)

## 2019-12-14 LAB — URINALYSIS, COMPLETE (UACMP) WITH MICROSCOPIC
Bacteria, UA: NONE SEEN
Bilirubin Urine: NEGATIVE
Glucose, UA: 50 mg/dL — AB
Hgb urine dipstick: NEGATIVE
Ketones, ur: NEGATIVE mg/dL
Leukocytes,Ua: NEGATIVE
Nitrite: NEGATIVE
Protein, ur: >=300 mg/dL — AB
Specific Gravity, Urine: 1.018 (ref 1.005–1.030)
pH: 6 (ref 5.0–8.0)

## 2019-12-14 LAB — URINE DRUG SCREEN, QUALITATIVE (ARMC ONLY)
Amphetamines, Ur Screen: NOT DETECTED
Barbiturates, Ur Screen: NOT DETECTED
Benzodiazepine, Ur Scrn: NOT DETECTED
Cannabinoid 50 Ng, Ur ~~LOC~~: NOT DETECTED
Cocaine Metabolite,Ur ~~LOC~~: NOT DETECTED
MDMA (Ecstasy)Ur Screen: NOT DETECTED
Methadone Scn, Ur: NOT DETECTED
Opiate, Ur Screen: NOT DETECTED
Phencyclidine (PCP) Ur S: NOT DETECTED
Tricyclic, Ur Screen: NOT DETECTED

## 2019-12-14 LAB — RESPIRATORY PANEL BY RT PCR (FLU A&B, COVID)
Influenza A by PCR: NEGATIVE
Influenza B by PCR: NEGATIVE
SARS Coronavirus 2 by RT PCR: NEGATIVE

## 2019-12-14 LAB — BLOOD GAS, VENOUS
Acid-Base Excess: 4.1 mmol/L — ABNORMAL HIGH (ref 0.0–2.0)
Bicarbonate: 31.1 mmol/L — ABNORMAL HIGH (ref 20.0–28.0)
O2 Saturation: 64.5 %
Patient temperature: 37
pCO2, Ven: 55 mmHg (ref 44.0–60.0)
pH, Ven: 7.36 (ref 7.250–7.430)
pO2, Ven: 35 mmHg (ref 32.0–45.0)

## 2019-12-14 LAB — LACTIC ACID, PLASMA: Lactic Acid, Venous: 1.2 mmol/L (ref 0.5–1.9)

## 2019-12-14 LAB — PROTIME-INR
INR: 1.1 (ref 0.8–1.2)
Prothrombin Time: 13.3 seconds (ref 11.4–15.2)

## 2019-12-14 MED ORDER — ENOXAPARIN SODIUM 40 MG/0.4ML ~~LOC~~ SOLN
40.0000 mg | Freq: Two times a day (BID) | SUBCUTANEOUS | Status: DC
  Administered 2019-12-14: 40 mg via SUBCUTANEOUS
  Filled 2019-12-14: qty 0.4

## 2019-12-14 MED ORDER — PRIMIDONE 50 MG PO TABS: 50.0000 mg | ORAL_TABLET | Freq: Two times a day (BID) | ORAL | Status: DC

## 2019-12-14 MED ORDER — HYDROCHLOROTHIAZIDE 25 MG PO TABS: 25.0000 mg | ORAL_TABLET | Freq: Every day | ORAL | Status: DC

## 2019-12-14 MED ORDER — BUDESONIDE 0.25 MG/2ML IN SUSP
0.2500 mg | Freq: Two times a day (BID) | RESPIRATORY_TRACT | Status: DC
  Administered 2019-12-15 – 2019-12-17 (×3): 0.25 mg via RESPIRATORY_TRACT
  Filled 2019-12-14 (×4): qty 2

## 2019-12-14 MED ORDER — IRBESARTAN 150 MG PO TABS
300.0000 mg | ORAL_TABLET | Freq: Every day | ORAL | Status: DC
  Administered 2019-12-14 – 2019-12-17 (×3): 300 mg via ORAL
  Filled 2019-12-14 (×4): qty 2

## 2019-12-14 MED ORDER — BECLOMETHASONE DIPROP HFA 40 MCG/ACT IN AERB: 1.0000 | INHALATION_SPRAY | Freq: Two times a day (BID) | RESPIRATORY_TRACT | Status: DC

## 2019-12-14 MED ORDER — GABAPENTIN 300 MG PO CAPS
300.0000 mg | ORAL_CAPSULE | Freq: Every day | ORAL | Status: DC
  Administered 2019-12-14 – 2019-12-16 (×3): 300 mg via ORAL
  Filled 2019-12-14 (×3): qty 1

## 2019-12-14 MED ORDER — INSULIN GLARGINE 100 UNIT/ML ~~LOC~~ SOLN
50.0000 [IU] | Freq: Every day | SUBCUTANEOUS | Status: DC
  Administered 2019-12-15 – 2019-12-17 (×3): 50 [IU] via SUBCUTANEOUS
  Filled 2019-12-14 (×5): qty 0.5

## 2019-12-14 MED ORDER — ACETAMINOPHEN 650 MG RE SUPP: 650.0000 mg | Freq: Four times a day (QID) | RECTAL | Status: DC | PRN

## 2019-12-14 MED ORDER — VITAMIN B-12 1000 MCG PO TABS
1000.0000 ug | ORAL_TABLET | Freq: Every day | ORAL | Status: DC
  Administered 2019-12-16 – 2019-12-17 (×2): 1000 ug via ORAL
  Filled 2019-12-14 (×2): qty 1

## 2019-12-14 MED ORDER — ASPIRIN EC 81 MG PO TBEC
81.0000 mg | DELAYED_RELEASE_TABLET | Freq: Every day | ORAL | Status: DC
  Administered 2019-12-14: 81 mg via ORAL
  Filled 2019-12-14: qty 1

## 2019-12-14 MED ORDER — IPRATROPIUM-ALBUTEROL 0.5-2.5 (3) MG/3ML IN SOLN
3.0000 mL | Freq: Once | RESPIRATORY_TRACT | Status: AC
  Administered 2019-12-14: 3 mL via RESPIRATORY_TRACT
  Filled 2019-12-14: qty 3

## 2019-12-14 MED ORDER — ONDANSETRON HCL 4 MG/2ML IJ SOLN: 4.0000 mg | Freq: Three times a day (TID) | INTRAMUSCULAR | Status: DC | PRN

## 2019-12-14 MED ORDER — ACYCLOVIR 200 MG PO CAPS
400.0000 mg | ORAL_CAPSULE | Freq: Two times a day (BID) | ORAL | Status: DC
  Administered 2019-12-14 – 2019-12-17 (×5): 400 mg via ORAL
  Filled 2019-12-14 (×7): qty 2

## 2019-12-14 MED ORDER — AZELASTINE HCL 0.1 % NA SOLN
2.0000 | Freq: Two times a day (BID) | NASAL | Status: DC
  Administered 2019-12-15 – 2019-12-17 (×5): 2 via NASAL
  Filled 2019-12-14: qty 30

## 2019-12-14 MED ORDER — LORAZEPAM 2 MG/ML IJ SOLN: 1.0000 mg | INTRAMUSCULAR | Status: DC | PRN

## 2019-12-14 MED ORDER — INSULIN ASPART 100 UNIT/ML ~~LOC~~ SOLN
0.0000 [IU] | Freq: Every day | SUBCUTANEOUS | Status: DC
  Administered 2019-12-15: 3 [IU] via SUBCUTANEOUS
  Filled 2019-12-14: qty 1

## 2019-12-14 MED ORDER — TAMSULOSIN HCL 0.4 MG PO CAPS
0.4000 mg | ORAL_CAPSULE | Freq: Every day | ORAL | Status: DC
  Administered 2019-12-14 – 2019-12-17 (×4): 0.4 mg via ORAL
  Filled 2019-12-14 (×3): qty 1

## 2019-12-14 MED ORDER — DIPHENHYDRAMINE HCL 50 MG/ML IJ SOLN
12.5000 mg | Freq: Once | INTRAMUSCULAR | Status: AC
  Administered 2019-12-14: 12.5 mg via INTRAVENOUS
  Filled 2019-12-14: qty 1

## 2019-12-14 MED ORDER — MUPIROCIN 2 % EX OINT
1.0000 "application " | TOPICAL_OINTMENT | Freq: Two times a day (BID) | CUTANEOUS | Status: DC
  Administered 2019-12-15 – 2019-12-17 (×5): 1 via TOPICAL
  Filled 2019-12-14: qty 22

## 2019-12-14 MED ORDER — ROSUVASTATIN CALCIUM 10 MG PO TABS
20.0000 mg | ORAL_TABLET | Freq: Every day | ORAL | Status: DC
  Administered 2019-12-14 – 2019-12-16 (×3): 20 mg via ORAL
  Filled 2019-12-14: qty 2
  Filled 2019-12-14: qty 1
  Filled 2019-12-14: qty 2

## 2019-12-14 MED ORDER — ACETAMINOPHEN 325 MG PO TABS
650.0000 mg | ORAL_TABLET | Freq: Four times a day (QID) | ORAL | Status: DC | PRN
  Administered 2019-12-16: 650 mg via ORAL

## 2019-12-14 MED ORDER — PROCHLORPERAZINE EDISYLATE 10 MG/2ML IJ SOLN
10.0000 mg | Freq: Once | INTRAMUSCULAR | Status: AC
  Administered 2019-12-14: 10 mg via INTRAVENOUS
  Filled 2019-12-14: qty 2

## 2019-12-14 MED ORDER — INSULIN ASPART 100 UNIT/ML ~~LOC~~ SOLN
0.0000 [IU] | Freq: Three times a day (TID) | SUBCUTANEOUS | Status: DC
  Administered 2019-12-14: 2 [IU] via SUBCUTANEOUS
  Administered 2019-12-15 (×2): 1 [IU] via SUBCUTANEOUS
  Administered 2019-12-16: 3 [IU] via SUBCUTANEOUS
  Administered 2019-12-16: 1 [IU] via SUBCUTANEOUS
  Administered 2019-12-17: 2 [IU] via SUBCUTANEOUS
  Administered 2019-12-17: 1 [IU] via SUBCUTANEOUS
  Administered 2019-12-17: 2 [IU] via SUBCUTANEOUS
  Filled 2019-12-14 (×8): qty 1

## 2019-12-14 MED ORDER — VITAMIN D 25 MCG (1000 UNIT) PO TABS
50.0000 ug | ORAL_TABLET | Freq: Every day | ORAL | Status: DC
  Administered 2019-12-16: 50 ug via ORAL
  Filled 2019-12-14 (×2): qty 1

## 2019-12-14 MED ORDER — ACETAMINOPHEN 325 MG PO TABS
650.0000 mg | ORAL_TABLET | Freq: Four times a day (QID) | ORAL | Status: DC | PRN
  Filled 2019-12-14: qty 2

## 2019-12-14 MED ORDER — CARVEDILOL 25 MG PO TABS
25.0000 mg | ORAL_TABLET | Freq: Two times a day (BID) | ORAL | Status: DC
  Administered 2019-12-15 – 2019-12-17 (×4): 25 mg via ORAL
  Filled 2019-12-14 (×4): qty 1

## 2019-12-14 MED ORDER — OLMESARTAN MEDOXOMIL-HCTZ 40-25 MG PO TABS: 1.0000 | ORAL_TABLET | Freq: Every day | ORAL | Status: DC

## 2019-12-14 MED ORDER — HYDRALAZINE HCL 20 MG/ML IJ SOLN: 10.0000 mg | Freq: Once | INTRAMUSCULAR | Status: DC

## 2019-12-14 MED ORDER — VITAMIN B-12 500 MCG PO TABS: 1000.0000 ug | ORAL_TABLET | Freq: Every day | ORAL | Status: DC

## 2019-12-14 MED ORDER — IPRATROPIUM-ALBUTEROL 0.5-2.5 (3) MG/3ML IN SOLN
3.0000 mL | Freq: Four times a day (QID) | RESPIRATORY_TRACT | Status: DC
  Administered 2019-12-14 – 2019-12-16 (×4): 3 mL via RESPIRATORY_TRACT
  Filled 2019-12-14 (×4): qty 3

## 2019-12-14 MED ORDER — VITAMIN D (CHOLECALCIFEROL) 25 MCG (1000 UT) PO CAPS: 50.0000 ug | ORAL_CAPSULE | Freq: Every day | ORAL | Status: DC

## 2019-12-14 MED ORDER — HYDRALAZINE HCL 20 MG/ML IJ SOLN
5.0000 mg | INTRAMUSCULAR | Status: DC | PRN
  Filled 2019-12-14: qty 1

## 2019-12-14 MED ORDER — PRIMIDONE 50 MG PO TABS
50.0000 mg | ORAL_TABLET | Freq: Two times a day (BID) | ORAL | Status: DC
  Administered 2019-12-15 – 2019-12-17 (×4): 50 mg via ORAL
  Filled 2019-12-14 (×6): qty 1

## 2019-12-14 MED ORDER — MUPIROCIN 2 % EX OINT: 1.0000 "application " | TOPICAL_OINTMENT | Freq: Two times a day (BID) | CUTANEOUS | Status: DC

## 2019-12-14 MED ORDER — AZELASTINE HCL 0.1 % NA SOLN: 2.0000 | Freq: Two times a day (BID) | NASAL | Status: DC

## 2019-12-14 NOTE — ED Provider Notes (Signed)
[facility] Emergency Department Provider Note    First MD Initiated Contact with Patient 12/14/19 1246     (approximate)  I have reviewed the triage vital signs and the nursing notes.   HISTORY  Chief Complaint Fall and Head Injury  Level V Caveat:  AMS  HPI CHARBEL LOS is a 79 y.o. male presents to the ER for evaluation of altered mental status status post fall that occurred at home today.  Patient encephalopathic markedly hypertensive.  Will open eyes to voice will follow commands.  No lateralizing weakness but reports severe headache after the fall.  EMS reports shaking spell concerning for seizure.  No documented history of seizure.    Past Medical History:  Diagnosis Date  . Arthritis   . Asthma 2000  . Back pain   . Colon polyp 2012  . COPD (chronic obstructive pulmonary disease) ([place])   . Diabetes mellitus without complication ([place])   . Emphysema of lung ([place])   . Heart murmur   . Hernia 2000  . Hyperlipidemia   . Hypertension   . Personal history of malignant neoplasm of large intestine   . Personal history of tobacco use, presenting hazards to health   . Sleep apnea   . Special screening for malignant neoplasms, colon    Family History  Problem Relation Age of Onset  . Diabetes Father   . Esophageal cancer Mother   . Alzheimer's disease Paternal Uncle    Past Surgical History:  Procedure Laterality Date  . CARDIAC CATHETERIZATION Left 09/25/2015   Procedure: Left Heart Cath and Coronary Angiography;  Surgeon: Yolonda Kida, MD;  Location: [place] [facility];  Service: Cardiovascular;  Laterality: Left;  . CHOLECYSTECTOMY  1970  . COLON SURGERY  07-16-99   sigmoid colon resection with primary anastomosis, chemotherapy for metastatic disease  . COLONOSCOPY  2001, 2012   Dr Bary Castilla, tubular adenoma of the cecum and ascending colon in 2012.  Marland Kitchen COLONOSCOPY WITH PROPOFOL N/A 02/12/2016   Procedure: COLONOSCOPY WITH PROPOFOL;   Surgeon: Robert Bellow, MD;  Location: [facility] ENDOSCOPY;  Service: Endoscopy;  Laterality: N/A;  . ESOPHAGOGASTRODUODENOSCOPY (EGD) WITH PROPOFOL N/A 02/12/2016   Procedure: ESOPHAGOGASTRODUODENOSCOPY (EGD) WITH PROPOFOL;  Surgeon: Robert Bellow, MD;  Location: [facility] ENDOSCOPY;  Service: Endoscopy;  Laterality: N/A;  . HERNIA REPAIR Right    right inguinal hernia repair  . JOINT REPLACEMENT Bilateral    knee replacement  . KNEE SURGERY Bilateral 2010  . MYRINGOTOMY WITH TUBE PLACEMENT Bilateral 08/16/2014   Procedure: MYRINGOTOMY WITH TUBE PLACEMENT;  Surgeon: Carloyn Manner, MD;  Location: [facility] ORS;  Service: ENT;  Laterality: Bilateral;  . PERIPHERAL VASCULAR CATHETERIZATION Right 09/04/2015   Procedure: Lower Extremity Angiography;  Surgeon: Katha Cabal, MD;  Location: [place] [facility];  Service: Cardiovascular;  Laterality: Right;  . PERIPHERAL VASCULAR CATHETERIZATION  09/04/2015   Procedure: Lower Extremity Intervention;  Surgeon: Katha Cabal, MD;  Location: [place] [facility];  Service: Cardiovascular;;  . TONSILLECTOMY    . TYMPANOSTOMY TUBE PLACEMENT     Patient Active Problem List   Diagnosis Date Noted  . Abscess 10/16/2019  . Pain in both lower extremities 07/14/2019  . Peripheral vascular disease with stasis dermatitis 05/23/2019  . B12 deficiency 04/23/2019  . Constipation 04/21/2019  . Tremor 11/14/2018  . Morbid obesity ([place]) 09/07/2017  . COPD (chronic obstructive pulmonary disease) ([place]) 04/29/2017  . Intertrigo 02/02/2017  . BPH (benign prostatic hyperplasia) 07/23/2016  .  Advanced care planning/counseling discussion 07/23/2016  . Eosinophilic esophagitis 90/24/0973  . PAD (peripheral artery disease) ([place]) 01/08/2016  . Gastroesophageal reflux disease without esophagitis 01/06/2016  . Hyperlipidemia associated with type 2 diabetes mellitus ([place]) 01/16/2015  . Sleep apnea 01/16/2015  . BMI 40.0-44.9, adult ([place]) 01/16/2015  . Hypertension  associated with diabetes ([place]) 10/11/2014  . Type 2 diabetes mellitus with proteinuria ([place]) 10/11/2014  . Back pain 10/11/2014  . History of colon cancer       Prior to Admission medications   Medication Sig Start Date End Date Taking? Authorizing Provider  acyclovir (ZOVIRAX) 400 MG tablet Take 400 mg by mouth 2 (two) times daily.    [provider]  aspirin EC 81 MG tablet Take 81 mg by mouth daily.     [provider]  azelastine (ASTELIN) 0.1 % nasal spray Place 2 sprays into both nostrils 2 (two) times daily. 11/22/19   [provider]  carvedilol (COREG) 25 MG tablet Take 1 tablet (25 mg total) by mouth 2 (two) times daily with a meal. 09/07/17   Crissman, Jeannette How, MD  clobetasol cream (TEMOVATE) 5.32 % Apply 1 application topically 2 (two) times daily. Apply to bilateral lower legs. 05/23/19   [name], Henrine Screws T, NP  furosemide (LASIX) 40 MG tablet TAKE ONE TABLET BY MOUTH EVERY DAY FOR BLOOD PRESSURE AND FOR FLUID CONTROL 06/27/19   [provider]  gabapentin (NEURONTIN) 300 MG capsule Start out taking 1 capsule (300 MG) at bedtime by mouth and if tolerated may increase 2 capsules (600 MG) at bedtime. 07/14/19   [name], Henrine Screws T, NP  insulin glargine (LANTUS) 100 UNIT/ML injection Inject 80 Units into the skin daily.     [provider]  ipratropium-albuterol (DUONEB) 0.5-2.5 (3) MG/3ML SOLN Take 3 mLs by nebulization every 6 (six) hours.    [provider]  mupirocin ointment (BACTROBAN) 2 % Apply 1 application topically 2 (two) times daily. To open blisters bilateral legs. 07/29/19   [name], Henrine Screws T, NP  naproxen (NAPROSYN) 500 MG tablet Take 500 mg by mouth 2 (two) times daily with a meal. Arthritis    [provider]  olmesartan-hydrochlorothiazide (BENICAR HCT) 40-25 MG tablet Take 1 tablet by mouth daily.  06/01/19 05/31/20  [provider]  primidone (MYSOLINE) 50 MG tablet Take 1 tablet (50 mg total) by mouth 2 (two)  times daily. 11/28/19   [name], Henrine Screws T, NP  QVAR REDIHALER 40 MCG/ACT inhaler 1 puff 2 (two) times daily.  02/22/19   [provider]  rosuvastatin (CRESTOR) 40 MG tablet Take 1 tablet (40 mg total) by mouth at bedtime. Patient taking differently: Take 40 mg by mouth at bedtime. 20 mg once a day 09/07/17   Guadalupe Maple, MD  tamsulosin (FLOMAX) 0.4 MG CAPS capsule Take 0.4 mg by mouth daily. Patient restarted on his own due to trouble urinating    [provider]  vitamin B-12 (CYANOCOBALAMIN) 500 MCG tablet Take 1,000 mcg by mouth daily. Two at night    [provider]  Vitamin D, Cholecalciferol, 25 MCG (1000 UT) CAPS Take 50 mcg by mouth daily. 2 a night    [provider]    Allergies Farxiga [dapagliflozin] and Jardiance [empagliflozin]    Social History Social History   Tobacco Use  . Smoking status: Former Smoker    Packs/day: 1.00    Years: 30.00    Pack years: 30.00    Types: Cigarettes    Quit  date: 03/23/1989    Years since quitting: 30.7  . Smokeless tobacco: Never Used  Vaping Use  . Vaping Use: Never used  Substance Use Topics  . Alcohol use: No    Alcohol/week: 0.0 standard drinks  . Drug use: No    Review of Systems Patient denies headaches, rhinorrhea, blurry vision, numbness, shortness of breath, chest pain, edema, cough, abdominal pain, nausea, vomiting, diarrhea, dysuria, fevers, rashes or hallucinations unless otherwise stated above in HPI. ____________________________________________   PHYSICAL EXAM:  VITAL SIGNS: Vitals:   12/14/19 1240  BP: (!) 221/100  Pulse: (!) 55  Resp: 20  Temp: 98 F (36.7 C)  SpO2: 98%    Constitutional: drowsy, postictal appearing, ill appearing but protecting his airway Eyes: Conjunctivae are normal.  Head: Atraumatic. Nose: No congestion/rhinnorhea. Mouth/Throat: Mucous membranes are moist.   Neck: No stridor. Painless ROM.  Cardiovascular: Normal rate, regular rhythm.  Grossly normal heart sounds.  Good peripheral circulation. Respiratory: Normal respiratory effort.  No retractions. Lungs with scattered wheeze throughout Gastrointestinal: Soft and nontender. No distention. No abdominal bruits. No CVA tenderness. Genitourinary: deferred Musculoskeletal: No lower extremity tenderness nor edema.  No joint effusions. Neurologic: drowsy, will follow two step. No lateralizing neurologic deficits are appreciated. No facial droop Skin:  Skin is warm, dry and intact. No rash noted. Psychiatric: unable to assess ____________________________________________   LABS (all labs ordered are listed, but only abnormal results are displayed)  Results for orders placed or performed during the hospital encounter of 12/14/19 (from the past 24 hour(s))  CBC with Differential/Platelet     Status: None   Collection Time: 12/14/19 12:49 PM  Result Value Ref Range   WBC 5.6 4.0 - 10.5 K/uL   RBC 5.03 4.22 - 5.81 MIL/uL   Hemoglobin 15.8 13.0 - 17.0 g/dL   HCT 44.7 39 - 52 %   MCV 88.9 80.0 - 100.0 fL   MCH 31.4 26.0 - 34.0 pg   MCHC 35.3 30.0 - 36.0 g/dL   RDW 12.9 11.5 - 15.5 %   Platelets 200 150 - 400 K/uL   nRBC 0.0 0.0 - 0.2 %   Neutrophils Relative % 65 %   Neutro Abs 3.6 1.7 - 7.7 K/uL   Lymphocytes Relative 22 %   Lymphs Abs 1.3 0.7 - 4.0 K/uL   Monocytes Relative 9 %   Monocytes Absolute 0.5 0 - 1 K/uL   Eosinophils Relative 2 %   Eosinophils Absolute 0.1 0 - 0 K/uL   Basophils Relative 1 %   Basophils Absolute 0.0 0 - 0 K/uL   Immature Granulocytes 1 %   Abs Immature Granulocytes 0.03 0.00 - 0.07 K/uL  Protime-INR     Status: None   Collection Time: 12/14/19 12:49 PM  Result Value Ref Range   Prothrombin Time 13.3 11.4 - 15.2 seconds   INR 1.1 0.8 - 1.2  Comprehensive metabolic panel     Status: Abnormal   Collection Time: 12/14/19 12:50 PM  Result Value Ref Range   Sodium 133 (L) 135 - 145 mmol/L   Potassium 3.9 3.5 - 5.1 mmol/L   Chloride 97 (L)  98 - 111 mmol/L   CO2 28 22 - 32 mmol/L   Glucose, Bld 201 (H) 70 - 99 mg/dL   BUN 13 8 - 23 mg/dL   Creatinine, Ser 0.89 0.61 - 1.24 mg/dL   Calcium 10.3 8.9 - 10.3 mg/dL   Total Protein 7.0 6.5 - 8.1 g/dL   Albumin 3.5 3.5 -  5.0 g/dL   AST 20 15 - 41 U/L   ALT 20 0 - 44 U/L   Alkaline Phosphatase 46 38 - 126 U/L   Total Bilirubin 1.2 0.3 - 1.2 mg/dL   GFR calc non Af Amer >60 >60 mL/min   GFR calc Af Amer >60 >60 mL/min   Anion gap 8 5 - 15  Troponin I (High Sensitivity)     Status: Abnormal   Collection Time: 12/14/19 12:50 PM  Result Value Ref Range   Troponin I (High Sensitivity) 29 (H) <18 ng/L  Blood gas, venous     Status: Abnormal   Collection Time: 12/14/19 12:50 PM  Result Value Ref Range   pH, Ven 7.36 7.25 - 7.43   pCO2, Ven 55 44 - 60 mmHg   pO2, Ven 35.0 32 - 45 mmHg   Bicarbonate 31.1 (H) 20.0 - 28.0 mmol/L   Acid-Base Excess 4.1 (H) 0.0 - 2.0 mmol/L   O2 Saturation 64.5 %   Patient temperature 37.0    Collection site VENOUS    Sample type VENOUS    Mechanical Rate VENOUS   Lactic acid, plasma     Status: None   Collection Time: 12/14/19 12:50 PM  Result Value Ref Range   Lactic Acid, Venous 1.2 0.5 - 1.9 mmol/L  Glucose, capillary     Status: Abnormal   Collection Time: 12/14/19 12:53 PM  Result Value Ref Range   Glucose-Capillary 207 (H) 70 - 99 mg/dL   ____________________________________________  EKG My review and personal interpretation at Time: 13:30   Indication: syncope  Rate: 60  Rhythm: sinus Axis: normal Other: normal intervals, no stemi ____________________________________________  RADIOLOGY  I personally reviewed all radiographic images ordered to evaluate for the above acute complaints and reviewed radiology reports and findings.  These findings were personally discussed with the patient.  Please see medical record for radiology report.  ____________________________________________   PROCEDURES  Procedure(s) performed:    Procedures    Critical Care performed: no ____________________________________________   INITIAL IMPRESSION / ASSESSMENT AND PLAN / ED COURSE  Pertinent labs & imaging results that were available during my care of the patient were reviewed by me and considered in my medical decision making (see chart for details).   DDX: Dehydration, sepsis, pna, uti, hypoglycemia, cva, drug effect, withdrawal, encephalitis   TORREN MAFFEO is a 79 y.o. who presents to the ED with presentation as described above.  He is afebrile but hypertensive.  Is encephalopathic did reportedly hit his head and is complaining of headache.  Concern for possible syncope and seizure that caused the fall.  Glucose is normal.  Patient taken to stat head CT to evaluate for the but differential shows no acute abnormality.  Repeat blood pressure without antihypertensive agent is normal.  He does not exhibit any signs of meningitis he does have any nuchal rigidity.  No fever normal white count.  VBG is normal.  Is supposed to wear oxygen at home but reportedly has not been doing that.  Clinical Course as of Dec 13 1521  Thu Dec 14, 2019  1436 Patient still complaining of mild headache.  During this fall possible syncopal episode mild troponin elevation do feel patient requires admission to the hospital.  No clear etiology for his symptoms but given the seizure-like activity possible seizure causing syncope.  Not complaining of any chest pain or shortness of breath at this time but does have hypoxia.  No wheezing on exam.  Reportedly is  supposed to be wearing oxygen at home on his 2 L nasal cannula he is with normal O2 saturations.  Still awaiting Covid test.   [PR]    Clinical Course User Index [PR] Merlyn Lot, MD    The patient was evaluated in Emergency Department today for the symptoms described in the history of present illness. He/she was evaluated in the context of the global COVID-19 pandemic, which necessitated  consideration that the patient might be at risk for infection with the SARS-CoV-2 virus that causes COVID-19. Institutional protocols and algorithms that pertain to the evaluation of patients at risk for COVID-19 are in a state of rapid change based on information released by regulatory bodies including the CDC and federal and state organizations. These policies and algorithms were followed during the patient's care in the ED.  As part of my medical decision making, I reviewed the following data within the [facility] notes reviewed and incorporated, Labs reviewed, notes from prior ED visits and [place] Controlled Substance Database   ____________________________________________   FINAL CLINICAL IMPRESSION(S) / ED DIAGNOSES  Final diagnoses:  Altered mental status, unspecified altered mental status type  Syncope, unspecified syncope type  Seizure-like activity ([place])      NEW MEDICATIONS STARTED DURING THIS VISIT:  New Prescriptions   No medications on file     Note:  This document was prepared using Dragon voice recognition software and may include unintentional dictation errors.    Merlyn Lot, MD 12/14/19 1524

## 2019-12-14 NOTE — ED Triage Notes (Signed)
Patient Justin Griffith from home. Patient had witnessed fall by wife. Per wife patient has been confused since he fell, patient complaining of headache and nausea. Per EMS patient had seizure like activity in route to ER. Patient alert to person.

## 2019-12-14 NOTE — ED Notes (Signed)
Pt placed in hospital bed at this time

## 2019-12-14 NOTE — H&P (Signed)
History and Physical    ARHUM PEEPLES ZRA:[id number] DOB: 18-Dec-1940 DOA: 12/14/2019  Referring MD/NP/PA:   PCP: [name], NP   Patient coming from:  The patient is coming from home.  At baseline, pt is partially dependent for most of ADL.        Chief Complaint: AMS, fall and seizure-like activity  HPI: Justin Griffith is a 79 y.o. male with medical history significant of hypertension, hyperlipidemia, diabetes mellitus, COPD, asthma, OSA, former smoker, BPH, colon cancer, PAD, who presents with altered mental status, fall, seizure-like activity.  Patient has AMS, and is unable to provide accurate medical history. I called his daughter who also does not know the detail information.  Per report, patient was noted to be confused, complains of headache and had unwitnessed fall.  EMS report, patient had 1 episode of seizure-like activities.  Patient has mild cough, but does not have active respiratory distress, no active nausea, vomiting, diarrhea noted.  Patient denies chest pain or shortness of breath.  Not sure if patient has symptoms of UTI.  No facial droop or slurred speech.  He moves all extremities normally. When I saw pt in ED, he is confused. He knows his own name and knows that he is in hospital.  He is confused about time.   ED Course: pt was found to have WBC 5.6, pending urinalysis, negative Covid PCR, troponin level 29, 27, lactic acid 1.2, INR 1.1, electrolytes renal function okay, temperature normal, blood pressure 221/100, 176, 93, heart rate 52, RR 23, oxygen saturation 92% on room air, which improved to 100% on 2 L oxygen.  CT of the head is negative for acute intracranial abnormalities.  Chest x-ray showed atelectatic change left base with equivocal left pleural effusion. Pt is placed on MedSurg [place] for observation.  Review of Systems: Could not be reviewed accurately due to altered mental status.   Allergy:  Allergies  Allergen Reactions  . Farxiga  [Dapagliflozin] Rash  . Jardiance [Empagliflozin] Rash    Past Medical History:  Diagnosis Date  . Arthritis   . Asthma 2000  . Back pain   . Colon polyp 2012  . COPD (chronic obstructive pulmonary disease) ([place])   . Diabetes mellitus without complication ([place])   . Emphysema of lung ([place])   . Heart murmur   . Hernia 2000  . Hyperlipidemia   . Hypertension   . Personal history of malignant neoplasm of large intestine   . Personal history of tobacco use, presenting hazards to health   . Sleep apnea   . Special screening for malignant neoplasms, colon     Past Surgical History:  Procedure Laterality Date  . CARDIAC CATHETERIZATION Left 09/25/2015   Procedure: Left Heart Cath and Coronary Angiography;  Surgeon: Yolonda Kida, MD;  Location: [place] [facility];  Service: Cardiovascular;  Laterality: Left;  . CHOLECYSTECTOMY  1970  . COLON SURGERY  07-16-99   sigmoid colon resection with primary anastomosis, chemotherapy for metastatic disease  . COLONOSCOPY  2001, 2012   Dr Bary Castilla, tubular adenoma of the cecum and ascending colon in 2012.  Marland Kitchen COLONOSCOPY WITH PROPOFOL N/A 02/12/2016   Procedure: COLONOSCOPY WITH PROPOFOL;  Surgeon: Robert Bellow, MD;  Location: [facility] ENDOSCOPY;  Service: Endoscopy;  Laterality: N/A;  . ESOPHAGOGASTRODUODENOSCOPY (EGD) WITH PROPOFOL N/A 02/12/2016   Procedure: ESOPHAGOGASTRODUODENOSCOPY (EGD) WITH PROPOFOL;  Surgeon: Robert Bellow, MD;  Location: [facility] ENDOSCOPY;  Service: Endoscopy;  Laterality: N/A;  . HERNIA REPAIR Right  right inguinal hernia repair  . JOINT REPLACEMENT Bilateral    knee replacement  . KNEE SURGERY Bilateral 2010  . MYRINGOTOMY WITH TUBE PLACEMENT Bilateral 08/16/2014   Procedure: MYRINGOTOMY WITH TUBE PLACEMENT;  Surgeon: Carloyn Manner, MD;  Location: [facility] ORS;  Service: ENT;  Laterality: Bilateral;  . PERIPHERAL VASCULAR CATHETERIZATION Right 09/04/2015   Procedure: Lower Extremity Angiography;  Surgeon:  Katha Cabal, MD;  Location: [place] [facility];  Service: Cardiovascular;  Laterality: Right;  . PERIPHERAL VASCULAR CATHETERIZATION  09/04/2015   Procedure: Lower Extremity Intervention;  Surgeon: Katha Cabal, MD;  Location: [place] [facility];  Service: Cardiovascular;;  . TONSILLECTOMY    . TYMPANOSTOMY TUBE PLACEMENT      Social History:  reports that he quit smoking about 30 years ago. His smoking use included cigarettes. He has a 30.00 pack-year smoking history. He has never used smokeless tobacco. He reports that he does not drink alcohol and does not use drugs.  Family History:  Family History  Problem Relation Age of Onset  . Diabetes Father   . Esophageal cancer Mother   . Alzheimer's disease Paternal Uncle      Prior to Admission medications   Medication Sig Start Date End Date Taking? Authorizing Provider  acyclovir (ZOVIRAX) 400 MG tablet Take 400 mg by mouth 2 (two) times daily.    [provider]  aspirin EC 81 MG tablet Take 81 mg by mouth daily.     [provider]  azelastine (ASTELIN) 0.1 % nasal spray Place 2 sprays into both nostrils 2 (two) times daily. 11/22/19   [provider]  carvedilol (COREG) 25 MG tablet Take 1 tablet (25 mg total) by mouth 2 (two) times daily with a meal. 09/07/17   Crissman, Jeannette How, MD  clobetasol cream (TEMOVATE) 2.29 % Apply 1 application topically 2 (two) times daily. Apply to bilateral lower legs. 05/23/19   [name], Henrine Screws T, NP  furosemide (LASIX) 40 MG tablet TAKE ONE TABLET BY MOUTH EVERY DAY FOR BLOOD PRESSURE AND FOR FLUID CONTROL 06/27/19   [provider]  gabapentin (NEURONTIN) 300 MG capsule Start out taking 1 capsule (300 MG) at bedtime by mouth and if tolerated may increase 2 capsules (600 MG) at bedtime. 07/14/19   [name], Henrine Screws T, NP  insulin glargine (LANTUS) 100 UNIT/ML injection Inject 80 Units into the skin daily.     [provider]  ipratropium-albuterol (DUONEB)  0.5-2.5 (3) MG/3ML SOLN Take 3 mLs by nebulization every 6 (six) hours.    [provider]  mupirocin ointment (BACTROBAN) 2 % Apply 1 application topically 2 (two) times daily. To open blisters bilateral legs. 07/29/19   [name], Henrine Screws T, NP  naproxen (NAPROSYN) 500 MG tablet Take 500 mg by mouth 2 (two) times daily with a meal. Arthritis    [provider]  olmesartan-hydrochlorothiazide (BENICAR HCT) 40-25 MG tablet Take 1 tablet by mouth daily.  06/01/19 05/31/20  [provider]  primidone (MYSOLINE) 50 MG tablet Take 1 tablet (50 mg total) by mouth 2 (two) times daily. 11/28/19   [name], Henrine Screws T, NP  QVAR REDIHALER 40 MCG/ACT inhaler 1 puff 2 (two) times daily.  02/22/19   [provider]  rosuvastatin (CRESTOR) 40 MG tablet Take 1 tablet (40 mg total) by mouth at bedtime. Patient taking differently: Take 40 mg by mouth at bedtime. 20 mg once a day 09/07/17   Guadalupe Maple, MD  tamsulosin (FLOMAX) 0.4 MG CAPS capsule Take  0.4 mg by mouth daily. Patient restarted on his own due to trouble urinating    [provider]  vitamin B-12 (CYANOCOBALAMIN) 500 MCG tablet Take 1,000 mcg by mouth daily. Two at night    [provider]  Vitamin D, Cholecalciferol, 25 MCG (1000 UT) CAPS Take 50 mcg by mouth daily. 2 a night    [provider]    Physical Exam: Vitals:   12/14/19 1500 12/14/19 1530 12/14/19 1630 12/14/19 1634  BP: (!) 175/99 (!) 179/91 (!) 182/93   Pulse: (!) 55 (!) 56 (!) 56 (!) 58  Resp: 14 15 16 18   Temp:      TempSrc:      SpO2: 100% 100% 94% 96%  Weight:      Height:       General: Not in acute distress HEENT:       Eyes: PERRL, EOMI, no scleral icterus.       ENT: No discharge from the ears and nose, no pharynx injection, no tonsillar enlargement.        Neck: No JVD, no bruit, no mass felt. Heme: No neck lymph node enlargement. Cardiac: S1/S2, RRR, No murmurs, No gallops or rubs. Respiratory:  No rales,  wheezing, rhonchi or rubs. GI: Soft, nondistended, nontender,  no organomegaly, BS present. GU: No hematuria Ext: No pitting leg edema bilaterally. 1+DP/PT pulse bilaterally. Musculoskeletal: No joint deformities, No joint redness or warmth, no limitation of ROM in spin. Skin: No rashes.  Neuro: confused, knows his own name and knows that he is in hospital.  Cranial nerves II-XII grossly intact, moves all extremities . Psych: Patient is not psychotic, no suicidal or hemocidal ideation.  Labs on Admission: I have personally reviewed following labs and imaging studies  CBC: Recent Labs  Lab 12/14/19 1249  WBC 5.6  NEUTROABS 3.6  HGB 15.8  HCT 44.7  MCV 88.9  PLT 546   Basic Metabolic Panel: Recent Labs  Lab 12/14/19 1250  NA 133*  K 3.9  CL 97*  CO2 28  GLUCOSE 201*  BUN 13  CREATININE 0.89  CALCIUM 10.3   GFR: Estimated Creatinine Clearance: 93.7 mL/min (by C-G formula based on SCr of 0.89 mg/dL). Liver Function Tests: Recent Labs  Lab 12/14/19 1250  AST 20  ALT 20  ALKPHOS 46  BILITOT 1.2  PROT 7.0  ALBUMIN 3.5   No results for input(s): LIPASE, AMYLASE in the last 168 hours. No results for input(s): AMMONIA in the last 168 hours. Coagulation Profile: Recent Labs  Lab 12/14/19 1249  INR 1.1   Cardiac Enzymes: No results for input(s): CKTOTAL, CKMB, CKMBINDEX, TROPONINI in the last 168 hours. BNP (last 3 results) No results for input(s): PROBNP in the last 8760 hours. HbA1C: No results for input(s): HGBA1C in the last 72 hours. CBG: Recent Labs  Lab 12/14/19 1253 12/14/19 1651  GLUCAP 207* 170*   Lipid Profile: No results for input(s): CHOL, HDL, LDLCALC, TRIG, CHOLHDL, LDLDIRECT in the last 72 hours. Thyroid Function Tests: No results for input(s): TSH, T4TOTAL, FREET4, T3FREE, THYROIDAB in the last 72 hours. Anemia Panel: No results for input(s): VITAMINB12, FOLATE, FERRITIN, TIBC, IRON, RETICCTPCT in the last 72 hours. Urine analysis:      Component Value Date/Time   APPEARANCEUR Clear 10/20/2019 1327   GLUCOSEU Negative 10/20/2019 1327   BILIRUBINUR Negative 10/20/2019 1327   PROTEINUR 3+ (A) 10/20/2019 1327   NITRITE Negative 10/20/2019 1327   LEUKOCYTESUR Negative 10/20/2019 1327   Sepsis Labs: @  LABRCNTIP(procalcitonin:4,lacticidven:4) ) Recent Results (from the past 240 hour(s))  Respiratory Panel by RT PCR (Flu A&B, Covid) - Nasopharyngeal Swab     Status: None   Collection Time: 12/14/19  1:26 PM   Specimen: Nasopharyngeal Swab  Result Value Ref Range Status   SARS Coronavirus 2 by RT PCR NEGATIVE NEGATIVE Final    Comment: (NOTE) SARS-CoV-2 target nucleic acids are NOT DETECTED.  The SARS-CoV-2 RNA is generally detectable in upper respiratoy specimens during the acute phase of infection. The lowest concentration of SARS-CoV-2 viral copies this assay can detect is 131 copies/mL. A negative result does not preclude SARS-Cov-2 infection and should not be used as the sole basis for treatment or other patient management decisions. A negative result may occur with  improper specimen collection/handling, submission of specimen other than nasopharyngeal swab, presence of viral mutation(s) within the areas targeted by this assay, and inadequate number of viral copies (<131 copies/mL). A negative result must be combined with clinical observations, patient history, and epidemiological information. The expected result is Negative.  Fact Sheet for Patients:  PinkCheek.be  Fact Sheet for Healthcare Providers:  GravelBags.it  This test is no t yet approved or cleared by the [place] FDA and  has been authorized for detection and/or diagnosis of SARS-CoV-2 by FDA under an Emergency Use Authorization (EUA). This EUA will remain  in effect (meaning this test can be used) for the duration of the COVID-19 declaration under Section 564(b)(1) of the Act, 21  U.S.C. section 360bbb-3(b)(1), unless the authorization is terminated or revoked sooner.     Influenza A by PCR NEGATIVE NEGATIVE Final   Influenza B by PCR NEGATIVE NEGATIVE Final    Comment: (NOTE) The Xpert Xpress SARS-CoV-2/FLU/RSV assay is intended as an aid in  the diagnosis of influenza from Nasopharyngeal swab specimens and  should not be used as a sole basis for treatment. Nasal washings and  aspirates are unacceptable for Xpert Xpress SARS-CoV-2/FLU/RSV  testing.  Fact Sheet for Patients: PinkCheek.be  Fact Sheet for Healthcare Providers: GravelBags.it  This test is not yet approved or cleared by the [place] FDA and  has been authorized for detection and/or diagnosis of SARS-CoV-2 by  FDA under an Emergency Use Authorization (EUA). This EUA will remain  in effect (meaning this test can be used) for the duration of the  Covid-19 declaration under Section 564(b)(1) of the Act, 21  U.S.C. section 360bbb-3(b)(1), unless the authorization is  terminated or revoked. Performed at [facility], [address] [place], [place] [address]      Radiological Exams on Admission: CT Head Wo Contrast  Result Date: 12/14/2019 CLINICAL DATA:  Mental status change, fall. EXAM: CT HEAD WITHOUT CONTRAST TECHNIQUE: Contiguous axial images were obtained from the base of the skull through the vertex without intravenous contrast. COMPARISON:  11/13/2019 FINDINGS: Brain: No evidence of acute large vascular territory infarction, hemorrhage, hydrocephalus, extra-axial collection or mass lesion/mass effect. Similar patchy white matter hypoattenuation, most likely the sequela of chronic microvascular ischemic disease. Vascular: Calcific intracranial atherosclerosis. Skull: Normal. Negative for fracture or focal lesion. Sinuses/Orbits: Mild ethmoid air cell mucosal thickening. Otherwise, the sinuses are clear. No acute orbital  abnormality. Bilateral native lens replacements. Other: Partially absent right-sided ossicles, better characterized on prior CT temporal bone. Under pneumatization of bilateral mastoid air cells with bilateral chronic opacification. IMPRESSION: 1. No evidence of acute intracranial abnormality. 2. Chronic opacification of bilateral mastoid air cells. Electronically Signed   By: Margaretha Sheffield MD  On: 12/14/2019 13:26   DG Chest Portable 1 View  Result Date: 12/14/2019 CLINICAL DATA:  Altered mental status with recent fall EXAM: PORTABLE CHEST 1 VIEW COMPARISON:  January 11, 2019 FINDINGS: There is mild atelectatic change in the left base with equivocal small left pleural effusion. Lungs elsewhere are clear. Heart is upper normal in size with pulmonary vascularity normal. No adenopathy. Degenerative change noted in the thoracic spine. IMPRESSION: Atelectatic change left base with equivocal left pleural effusion. Lungs elsewhere clear. Stable cardiac silhouette. Electronically Signed   By: Lowella Grip III M.D.   On: 12/14/2019 13:36     EKG:   Not done in ED, will get one.   Assessment/Plan Principal Problem:   Acute metabolic encephalopathy Active Problems:   Hypertension associated with diabetes ([place])   Type 2 diabetes mellitus with proteinuria (H[facility])   Hyperlipidemia associated with type 2 diabetes mellitus (H[facility])   BPH (benign prostatic hyperplasia)   COPD (chronic obstructive pulmonary disease) (H[facility])   Fall   Elevated troponin   Acute metabolic encephalopathy: Etiology is not clear.  Per EMS report, patient had episode of seizure-like activity. No hx of seizure.  CT head is negative for acute intracranial abnormalities.  Patient is taking primidone at home, not sure why patient is taking this medication.  Will continue this medication.  -Place on MedSurg bed for position -Frequent neuro check -Get MRI of brain and EEG -Seizure precaution -When necessary Ativan for  seizure -Check UDS -Follow-up urinalysis -Message sent to Dr. Irish Elders of neurology for consultation  Hypertension associated with diabetes [facility]): Blood pressure elevated initially, 221/100 --> 176/93. -Hold Lasix since patient has decreased oral intake due to altered mental status -Continue Benicar-HCTZ -IV hydralazine as needed  Type 2 diabetes mellitus with proteinuria ([place]): Recent A1c 7.7, poorly controlled.  Patient taking Lantus at home -Sliding scale insulin -Decrease Lantus from 80 to 50 units daily  Hyperlipidemia associated with type 2 diabetes mellitus (HCC) -Crestor  BPH (benign prostatic hyperplasia) -Flomax  COPD (chronic obstructive pulmonary disease) (HCC) -Bronchodilators  Fall -PT/OT  Elevated troponin: Troponin 29 -->27.  Likely due to demand ischemia secondary to elevated blood pressure. -on ASA and crestor -Trend troponin -Check A1c, FLP -Get EKG now and repeat EKG in morning -Check UDS    DVT ppx: SQ Lovenox Code Status: Full code per his daughter Family Communication:  Yes, patient's daughter by phone Disposition Plan:  Anticipate discharge back to previous environment Consults called:  Message sent to Dr. Irish Elders for neurology Admission status: Med-surg bed for obs    Status is: Observation  The patient remains OBS appropriate and will d/c before 2 midnights.  Dispo: The patient is from: Home              Anticipated d/c is to: Home              Anticipated d/c date is: 1 day              Patient currently is not medically stable to d/c.            Date of Service 12/14/2019    Ivor Costa [facility]   If 7PM-7AM, please contact night-coverage [email or web address] 12/14/2019, 5:36 PM

## 2019-12-14 NOTE — ED Notes (Signed)
Patient transported to MRI 

## 2019-12-15 ENCOUNTER — Telehealth: Payer: Medicare Other

## 2019-12-15 ENCOUNTER — Observation Stay: Payer: Medicare Other

## 2019-12-15 DIAGNOSIS — Z86010 Personal history of colonic polyps: Secondary | ICD-10-CM | POA: Diagnosis not present

## 2019-12-15 DIAGNOSIS — E1151 Type 2 diabetes mellitus with diabetic peripheral angiopathy without gangrene: Secondary | ICD-10-CM | POA: Diagnosis present

## 2019-12-15 DIAGNOSIS — G9341 Metabolic encephalopathy: Secondary | ICD-10-CM | POA: Diagnosis present

## 2019-12-15 DIAGNOSIS — Z87891 Personal history of nicotine dependence: Secondary | ICD-10-CM | POA: Diagnosis not present

## 2019-12-15 DIAGNOSIS — N400 Enlarged prostate without lower urinary tract symptoms: Secondary | ICD-10-CM | POA: Diagnosis present

## 2019-12-15 DIAGNOSIS — I152 Hypertension secondary to endocrine disorders: Secondary | ICD-10-CM | POA: Diagnosis present

## 2019-12-15 DIAGNOSIS — J90 Pleural effusion, not elsewhere classified: Secondary | ICD-10-CM | POA: Diagnosis present

## 2019-12-15 DIAGNOSIS — Z79899 Other long term (current) drug therapy: Secondary | ICD-10-CM | POA: Diagnosis not present

## 2019-12-15 DIAGNOSIS — S0003XA Contusion of scalp, initial encounter: Secondary | ICD-10-CM | POA: Diagnosis present

## 2019-12-15 DIAGNOSIS — J449 Chronic obstructive pulmonary disease, unspecified: Secondary | ICD-10-CM

## 2019-12-15 DIAGNOSIS — R296 Repeated falls: Secondary | ICD-10-CM | POA: Diagnosis not present

## 2019-12-15 DIAGNOSIS — R17 Unspecified jaundice: Secondary | ICD-10-CM | POA: Diagnosis present

## 2019-12-15 DIAGNOSIS — Z9049 Acquired absence of other specified parts of digestive tract: Secondary | ICD-10-CM | POA: Diagnosis not present

## 2019-12-15 DIAGNOSIS — R55 Syncope and collapse: Secondary | ICD-10-CM | POA: Diagnosis not present

## 2019-12-15 DIAGNOSIS — Z20822 Contact with and (suspected) exposure to covid-19: Secondary | ICD-10-CM | POA: Diagnosis present

## 2019-12-15 DIAGNOSIS — N401 Enlarged prostate with lower urinary tract symptoms: Secondary | ICD-10-CM | POA: Diagnosis not present

## 2019-12-15 DIAGNOSIS — I639 Cerebral infarction, unspecified: Secondary | ICD-10-CM | POA: Diagnosis not present

## 2019-12-15 DIAGNOSIS — I10 Essential (primary) hypertension: Secondary | ICD-10-CM | POA: Diagnosis not present

## 2019-12-15 DIAGNOSIS — Z7982 Long term (current) use of aspirin: Secondary | ICD-10-CM | POA: Diagnosis not present

## 2019-12-15 DIAGNOSIS — Z96653 Presence of artificial knee joint, bilateral: Secondary | ICD-10-CM | POA: Diagnosis present

## 2019-12-15 DIAGNOSIS — E1169 Type 2 diabetes mellitus with other specified complication: Secondary | ICD-10-CM | POA: Diagnosis present

## 2019-12-15 DIAGNOSIS — E1159 Type 2 diabetes mellitus with other circulatory complications: Secondary | ICD-10-CM | POA: Diagnosis not present

## 2019-12-15 DIAGNOSIS — G250 Essential tremor: Secondary | ICD-10-CM | POA: Diagnosis present

## 2019-12-15 DIAGNOSIS — H748X3 Other specified disorders of middle ear and mastoid, bilateral: Secondary | ICD-10-CM | POA: Diagnosis not present

## 2019-12-15 DIAGNOSIS — I6523 Occlusion and stenosis of bilateral carotid arteries: Secondary | ICD-10-CM | POA: Diagnosis not present

## 2019-12-15 DIAGNOSIS — E669 Obesity, unspecified: Secondary | ICD-10-CM | POA: Diagnosis present

## 2019-12-15 DIAGNOSIS — R569 Unspecified convulsions: Secondary | ICD-10-CM | POA: Diagnosis present

## 2019-12-15 DIAGNOSIS — J439 Emphysema, unspecified: Secondary | ICD-10-CM | POA: Diagnosis present

## 2019-12-15 DIAGNOSIS — E785 Hyperlipidemia, unspecified: Secondary | ICD-10-CM | POA: Diagnosis present

## 2019-12-15 DIAGNOSIS — E1129 Type 2 diabetes mellitus with other diabetic kidney complication: Secondary | ICD-10-CM | POA: Diagnosis not present

## 2019-12-15 DIAGNOSIS — Z85038 Personal history of other malignant neoplasm of large intestine: Secondary | ICD-10-CM | POA: Diagnosis not present

## 2019-12-15 DIAGNOSIS — G4733 Obstructive sleep apnea (adult) (pediatric): Secondary | ICD-10-CM | POA: Diagnosis present

## 2019-12-15 DIAGNOSIS — Z833 Family history of diabetes mellitus: Secondary | ICD-10-CM | POA: Diagnosis not present

## 2019-12-15 DIAGNOSIS — M79604 Pain in right leg: Secondary | ICD-10-CM | POA: Diagnosis not present

## 2019-12-15 DIAGNOSIS — W19XXXA Unspecified fall, initial encounter: Secondary | ICD-10-CM | POA: Diagnosis not present

## 2019-12-15 DIAGNOSIS — I248 Other forms of acute ischemic heart disease: Secondary | ICD-10-CM | POA: Diagnosis present

## 2019-12-15 DIAGNOSIS — I7090 Unspecified atherosclerosis: Secondary | ICD-10-CM | POA: Diagnosis not present

## 2019-12-15 DIAGNOSIS — R350 Frequency of micturition: Secondary | ICD-10-CM | POA: Diagnosis not present

## 2019-12-15 DIAGNOSIS — R809 Proteinuria, unspecified: Secondary | ICD-10-CM | POA: Diagnosis not present

## 2019-12-15 LAB — COMPREHENSIVE METABOLIC PANEL
ALT: 18 U/L (ref 0–44)
AST: 20 U/L (ref 15–41)
Albumin: 3.1 g/dL — ABNORMAL LOW (ref 3.5–5.0)
Alkaline Phosphatase: 40 U/L (ref 38–126)
Anion gap: 8 (ref 5–15)
BUN: 15 mg/dL (ref 8–23)
CO2: 29 mmol/L (ref 22–32)
Calcium: 10.3 mg/dL (ref 8.9–10.3)
Chloride: 100 mmol/L (ref 98–111)
Creatinine, Ser: 1.02 mg/dL (ref 0.61–1.24)
GFR calc Af Amer: >60 mL/min (ref >60)
GFR calc non Af Amer: >60 mL/min (ref >60)
Glucose, Bld: 139 mg/dL — ABNORMAL HIGH (ref 70–99)
Potassium: 3.6 mmol/L (ref 3.5–5.1)
Sodium: 137 mmol/L (ref 135–145)
Total Bilirubin: 1.3 mg/dL — ABNORMAL HIGH (ref 0.3–1.2)
Total Protein: 6.2 g/dL — ABNORMAL LOW (ref 6.5–8.1)

## 2019-12-15 LAB — LIPID PANEL
Cholesterol: 135 mg/dL (ref 0–200)
HDL: 30 mg/dL — ABNORMAL LOW (ref >40)
LDL Cholesterol: 60 mg/dL (ref 0–99)
Total CHOL/HDL Ratio: 4.5 RATIO
Triglycerides: 226 mg/dL — ABNORMAL HIGH (ref <150)
VLDL: 45 mg/dL — ABNORMAL HIGH (ref 0–40)

## 2019-12-15 LAB — GLUCOSE, CAPILLARY
Glucose-Capillary: 144 mg/dL — ABNORMAL HIGH (ref 70–99)
Glucose-Capillary: 161 mg/dL — ABNORMAL HIGH (ref 70–99)
Glucose-Capillary: 128 mg/dL — ABNORMAL HIGH (ref 70–99)
Glucose-Capillary: 262 mg/dL — ABNORMAL HIGH (ref 70–99)

## 2019-12-15 LAB — CBC
HCT: 41.9 % (ref 39.0–52.0)
Hemoglobin: 14.8 g/dL (ref 13.0–17.0)
MCH: 31.2 pg (ref 26.0–34.0)
MCHC: 35.3 g/dL (ref 30.0–36.0)
MCV: 88.2 fL (ref 80.0–100.0)
Platelets: 182 10*3/uL (ref 150–400)
RBC: 4.75 MIL/uL (ref 4.22–5.81)
RDW: 12.7 % (ref 11.5–15.5)
WBC: 7.1 10*3/uL (ref 4.0–10.5)
nRBC: 0 % (ref 0.0–0.2)

## 2019-12-15 LAB — PHOSPHORUS: Phosphorus: 3.3 mg/dL (ref 2.5–4.6)

## 2019-12-15 LAB — HEMOGLOBIN A1C
Hgb A1c MFr Bld: 7.5 % — ABNORMAL HIGH (ref 4.8–5.6)
Mean Plasma Glucose: 168.55 mg/dL

## 2019-12-15 LAB — FIBRIN DERIVATIVES D-DIMER (ARMC ONLY): Fibrin derivatives D-dimer (ARMC): 373.23 ng/mL (FEU) (ref 0.00–499.00)

## 2019-12-15 LAB — MAGNESIUM: Magnesium: 1.9 mg/dL (ref 1.7–2.4)

## 2019-12-15 LAB — TROPONIN I (HIGH SENSITIVITY): Troponin I (High Sensitivity): 40 ng/L — ABNORMAL HIGH (ref <18)

## 2019-12-15 MED ORDER — ASPIRIN 300 MG RE SUPP
300.0000 mg | Freq: Every day | RECTAL | Status: DC
  Filled 2019-12-15 (×2): qty 1

## 2019-12-15 MED ORDER — HYDRALAZINE HCL 20 MG/ML IJ SOLN
10.0000 mg | INTRAMUSCULAR | Status: DC | PRN
  Administered 2019-12-16 (×2): 10 mg via INTRAVENOUS
  Filled 2019-12-15 (×2): qty 1

## 2019-12-15 MED ORDER — STROKE: EARLY STAGES OF RECOVERY BOOK: Freq: Once | Status: AC

## 2019-12-15 MED ORDER — HYDRALAZINE HCL 20 MG/ML IJ SOLN
10.0000 mg | INTRAMUSCULAR | Status: DC | PRN
  Filled 2019-12-15: qty 1

## 2019-12-15 MED ORDER — ASPIRIN EC 325 MG PO TBEC
325.0000 mg | DELAYED_RELEASE_TABLET | Freq: Every day | ORAL | Status: DC
  Administered 2019-12-16 – 2019-12-17 (×2): 325 mg via ORAL
  Filled 2019-12-15 (×2): qty 1

## 2019-12-15 MED ORDER — ENOXAPARIN SODIUM 40 MG/0.4ML ~~LOC~~ SOLN
40.0000 mg | SUBCUTANEOUS | Status: DC
  Administered 2019-12-15 – 2019-12-16 (×2): 40 mg via SUBCUTANEOUS
  Filled 2019-12-15 (×2): qty 0.4

## 2019-12-15 NOTE — Progress Notes (Signed)
eeg done °

## 2019-12-15 NOTE — Procedures (Signed)
Patient Name: Justin Griffith  MRN: [id number]  Epilepsy Attending: Lora Havens  Referring Physician/Provider: Dr Hilton Sinclair Date: 12/15/2019 Duration: 20.39 mins  Patient history: 79 y.o. male medical history significant ofhypertension, hyperlipidemia, diabetes mellitus, COPD, asthma, OSA, former smoker, BPH, colon cancer, PAD, who presents with altered mental status, fall, and shaking. EEG to evaluate for seizure.  Level of alertness: Awake,asleep  AEDs during EEG study: Gabapentin  Technical aspects: This EEG study was done with scalp electrodes positioned according to the 10-20 International system of electrode placement. Electrical activity was acquired at a sampling rate of 500Hz  and reviewed with a high frequency filter of 70Hz  and a low frequency filter of 1Hz . EEG data were recorded continuously and digitally stored.   Description: The posterior dominant rhythm consists of 8 Hz activity of moderate voltage (25-35 uV) seen predominantly in posterior head regions, symmetric and reactive to eye opening and eye closing. Sleep was characterized by vertex waves, sleep spindles (12 to 14 Hz), maximal frontocentral region.   Physiologic photic driving was not seen during photic stimulation.  Hyperventilation was not performed.     IMPRESSION: This study is within normal limits. No seizures or epileptiform discharges were seen throughout the recording.  Wilburta Milbourn Barbra Sarks

## 2019-12-15 NOTE — Consult Note (Signed)
Reason for Consult: confusion  Requesting Physician: Dr. Alfredia Ferguson   CC: confusion    HPI: Justin Griffith is an 79 y.o. male medical history significant of hypertension, hyperlipidemia, diabetes mellitus, COPD, asthma, OSA, former smoker, BPH, colon cancer, PAD, who presents with altered mental status, fall, and shaking. Patient initially could not give any history but this AM much more awake.  No prior hx of seizures but does state he has generalized tremors worse off on with movement    Past Medical History:  Diagnosis Date  . Arthritis   . Asthma 2000  . Back pain   . Colon polyp 2012  . COPD (chronic obstructive pulmonary disease) ([facility])   . Diabetes mellitus without complication ([place])   . Emphysema of lung ([place])   . Heart murmur   . Hernia 2000  . Hyperlipidemia   . Hypertension   . Personal history of malignant neoplasm of large intestine   . Personal history of tobacco use, presenting hazards to health   . Sleep apnea   . Special screening for malignant neoplasms, colon     Past Surgical History:  Procedure Laterality Date  . CARDIAC CATHETERIZATION Left 09/25/2015   Procedure: Left Heart Cath and Coronary Angiography;  Surgeon: Yolonda Kida, MD;  Location: [place] [facility];  Service: Cardiovascular;  Laterality: Left;  . CHOLECYSTECTOMY  1970  . COLON SURGERY  07-16-99   sigmoid colon resection with primary anastomosis, chemotherapy for metastatic disease  . COLONOSCOPY  2001, 2012   Dr Bary Castilla, tubular adenoma of the cecum and ascending colon in 2012.  Marland Kitchen COLONOSCOPY WITH PROPOFOL N/A 02/12/2016   Procedure: COLONOSCOPY WITH PROPOFOL;  Surgeon: Robert Bellow, MD;  Location: [facility] ENDOSCOPY;  Service: Endoscopy;  Laterality: N/A;  . ESOPHAGOGASTRODUODENOSCOPY (EGD) WITH PROPOFOL N/A 02/12/2016   Procedure: ESOPHAGOGASTRODUODENOSCOPY (EGD) WITH PROPOFOL;  Surgeon: Robert Bellow, MD;  Location: [facility] ENDOSCOPY;  Service: Endoscopy;  Laterality: N/A;  . HERNIA  REPAIR Right    right inguinal hernia repair  . JOINT REPLACEMENT Bilateral    knee replacement  . KNEE SURGERY Bilateral 2010  . MYRINGOTOMY WITH TUBE PLACEMENT Bilateral 08/16/2014   Procedure: MYRINGOTOMY WITH TUBE PLACEMENT;  Surgeon: Carloyn Manner, MD;  Location: [facility] ORS;  Service: ENT;  Laterality: Bilateral;  . PERIPHERAL VASCULAR CATHETERIZATION Right 09/04/2015   Procedure: Lower Extremity Angiography;  Surgeon: Katha Cabal, MD;  Location: [place] [facility];  Service: Cardiovascular;  Laterality: Right;  . PERIPHERAL VASCULAR CATHETERIZATION  09/04/2015   Procedure: Lower Extremity Intervention;  Surgeon: Katha Cabal, MD;  Location: [place] [facility];  Service: Cardiovascular;;  . TONSILLECTOMY    . TYMPANOSTOMY TUBE PLACEMENT      Family History  Problem Relation Age of Onset  . Diabetes Father   . Esophageal cancer Mother   . Alzheimer's disease Paternal Uncle     Social History:  reports that he quit smoking about 30 years ago. His smoking use included cigarettes. He has a 30.00 pack-year smoking history. He has never used smokeless tobacco. He reports that he does not drink alcohol and does not use drugs.  Allergies  Allergen Reactions  . Farxiga [Dapagliflozin] Rash  . Jardiance [Empagliflozin] Rash    Medications: I have reviewed the patient's current medications.  ROS: Unable to obtain due to hard of hearing   Physical Examination: Blood pressure (!) 150/78, pulse (!) 56, temperature 98 F (36.7 C), temperature source Oral, resp. rate 16, height 6' (1.829 m),  weight 127.7 kg, SpO2 91 %.   Neurological Examination   Mental Status: Alert, oriented, thought content appropriate speech appears fluent . Cranial Nerves: II: visual fields grossly normal, pupils equal, round, reactive to light and accommodation III,IV, VI: ptosis not present, extra-ocular motions intact bilaterally V,VII: smile symmetric, facial light touch sensation normal  bilaterally VIII: hearing normal bilaterally IX,X: gag reflex present XI: bilateral shoulder shrug XII: midline tongue extension Motor: Moves everything symmetrically  Sensory: Pinprick and light touch intact throughout, bilaterally     Laboratory Studies:   Basic Metabolic Panel: Recent Labs  Lab 12/14/19 1250 12/15/19 0503  NA 133* 137  K 3.9 3.6  CL 97* 100  CO2 28 29  GLUCOSE 201* 139*  BUN 13 15  CREATININE 0.89 1.02  CALCIUM 10.3 10.3  MG  --  1.9  PHOS  --  3.3    Liver Function Tests: Recent Labs  Lab 12/14/19 1250 12/15/19 0503  AST 20 20  ALT 20 18  ALKPHOS 46 40  BILITOT 1.2 1.3*  PROT 7.0 6.2*  ALBUMIN 3.5 3.1*   No results for input(s): LIPASE, AMYLASE in the last 168 hours. No results for input(s): AMMONIA in the last 168 hours.  CBC: Recent Labs  Lab 12/14/19 1249 12/15/19 0503  WBC 5.6 7.1  NEUTROABS 3.6  --   HGB 15.8 14.8  HCT 44.7 41.9  MCV 88.9 88.2  PLT 200 182    Cardiac Enzymes: No results for input(s): CKTOTAL, CKMB, CKMBINDEX, TROPONINI in the last 168 hours.  BNP: Invalid input(s): POCBNP  CBG: Recent Labs  Lab 12/14/19 1253 12/14/19 1651 12/14/19 2222  GLUCAP 207* 170* 135*    Microbiology: Results for orders placed or performed during the hospital encounter of 12/14/19  Respiratory Panel by RT PCR (Flu A&B, Covid) - Nasopharyngeal Swab     Status: None   Collection Time: 12/14/19  1:26 PM   Specimen: Nasopharyngeal Swab  Result Value Ref Range Status   SARS Coronavirus 2 by RT PCR NEGATIVE NEGATIVE Final    Comment: (NOTE) SARS-CoV-2 target nucleic acids are NOT DETECTED.  The SARS-CoV-2 RNA is generally detectable in upper respiratoy specimens during the acute phase of infection. The lowest concentration of SARS-CoV-2 viral copies this assay can detect is 131 copies/mL. A negative result does not preclude SARS-Cov-2 infection and should not be used as the sole basis for treatment or other patient  management decisions. A negative result may occur with  improper specimen collection/handling, submission of specimen other than nasopharyngeal swab, presence of viral mutation(s) within the areas targeted by this assay, and inadequate number of viral copies (<131 copies/mL). A negative result must be combined with clinical observations, patient history, and epidemiological information. The expected result is Negative.  Fact Sheet for Patients:  PinkCheek.be  Fact Sheet for Healthcare Providers:  GravelBags.it  This test is no t yet approved or cleared by the [place] FDA and  has been authorized for detection and/or diagnosis of SARS-CoV-2 by FDA under an Emergency Use Authorization (EUA). This EUA will remain  in effect (meaning this test can be used) for the duration of the COVID-19 declaration under Section 564(b)(1) of the Act, 21 U.S.C. section 360bbb-3(b)(1), unless the authorization is terminated or revoked sooner.     Influenza A by PCR NEGATIVE NEGATIVE Final   Influenza B by PCR NEGATIVE NEGATIVE Final    Comment: (NOTE) The Xpert Xpress SARS-CoV-2/FLU/RSV assay is intended as an aid in  the diagnosis of  influenza from Nasopharyngeal swab specimens and  should not be used as a sole basis for treatment. Nasal washings and  aspirates are unacceptable for Xpert Xpress SARS-CoV-2/FLU/RSV  testing.  Fact Sheet for Patients: PinkCheek.be  Fact Sheet for Healthcare Providers: GravelBags.it  This test is not yet approved or cleared by the [place] FDA and  has been authorized for detection and/or diagnosis of SARS-CoV-2 by  FDA under an Emergency Use Authorization (EUA). This EUA will remain  in effect (meaning this test can be used) for the duration of the  Covid-19 declaration under Section 564(b)(1) of the Act, 21  U.S.C. section 360bbb-3(b)(1),  unless the authorization is  terminated or revoked. Performed at [facility], [address] [place], [place] [address]     Coagulation Studies: Recent Labs    12/14/19 1249  LABPROT 13.3  INR 1.1    Urinalysis:  Recent Labs  Lab 12/14/19 2015  COLORURINE YELLOW*  LABSPEC 1.018  PHURINE 6.0  GLUCOSEU 50*  HGBUR NEGATIVE  BILIRUBINUR NEGATIVE  KETONESUR NEGATIVE  PROTEINUR >=300*  NITRITE NEGATIVE  LEUKOCYTESUR NEGATIVE    Lipid Panel:     Component Value Date/Time   CHOL 135 12/15/2019 0503   CHOL 134 11/28/2019 1002   CHOL 89 02/02/2017 0823   TRIG 226 (H) 12/15/2019 0503   TRIG 262 (H) 02/02/2017 0823   HDL 30 (L) 12/15/2019 0503   HDL 41 11/28/2019 1002   CHOLHDL 4.5 12/15/2019 0503   VLDL 45 (H) 12/15/2019 0503   VLDL 52 (H) 02/02/2017 0823   LDLCALC 60 12/15/2019 0503   LDLCALC 63 11/28/2019 1002    HgbA1C:  Lab Results  Component Value Date   HGBA1C 7.7 01/11/2018    Urine Drug Screen:      Component Value Date/Time   LABOPIA NONE DETECTED 12/14/2019 2015   COCAINSCRNUR NONE DETECTED 12/14/2019 2015   LABBENZ NONE DETECTED 12/14/2019 2015   AMPHETMU NONE DETECTED 12/14/2019 2015   THCU NONE DETECTED 12/14/2019 2015   LABBARB NONE DETECTED 12/14/2019 2015    Alcohol Level: No results for input(s): ETH in the last 168 hours.  Other results: EKG: normal EKG, normal sinus rhythm, unchanged from previous tracings.  Imaging: CT Head Wo Contrast  Result Date: 12/14/2019 CLINICAL DATA:  Mental status change, fall. EXAM: CT HEAD WITHOUT CONTRAST TECHNIQUE: Contiguous axial images were obtained from the base of the skull through the vertex without intravenous contrast. COMPARISON:  11/13/2019 FINDINGS: Brain: No evidence of acute large vascular territory infarction, hemorrhage, hydrocephalus, extra-axial collection or mass lesion/mass effect. Similar patchy white matter hypoattenuation, most likely the sequela of chronic microvascular  ischemic disease. Vascular: Calcific intracranial atherosclerosis. Skull: Normal. Negative for fracture or focal lesion. Sinuses/Orbits: Mild ethmoid air cell mucosal thickening. Otherwise, the sinuses are clear. No acute orbital abnormality. Bilateral native lens replacements. Other: Partially absent right-sided ossicles, better characterized on prior CT temporal bone. Under pneumatization of bilateral mastoid air cells with bilateral chronic opacification. IMPRESSION: 1. No evidence of acute intracranial abnormality. 2. Chronic opacification of bilateral mastoid air cells. Electronically Signed   By: Margaretha Sheffield MD   On: 12/14/2019 13:26   MR BRAIN WO CONTRAST  Result Date: 12/14/2019 CLINICAL DATA:  Initial evaluation for acute seizure. EXAM: MRI HEAD WITHOUT CONTRAST TECHNIQUE: Multiplanar, multiecho pulse sequences of the brain and surrounding structures were obtained without intravenous contrast. COMPARISON:  Prior CT from earlier the same day as well as previous MRI from 04/21/2019. FINDINGS: Brain: Generalized age-related cerebral  atrophy. Patchy and confluent T2/FLAIR hyperintensity within the periventricular deep white matter both cerebral hemispheres, nonspecific, but most commonly related to chronic microvascular ischemic disease. Overall, appearance is mildly progressed from previous. Punctate 3 mm focus of restricted diffusion involving the cortical gray matter of the right occipital lobe consistent with a small acute ischemic infarct (series 5, image 26). Additional tiny 3 mm focus of diffusion abnormality at the posterior left frontal cortex also suspicious for a tiny acute to early subacute ischemic infarct (series 5, image 38). No associated hemorrhage or mass effect about these lesions. No other foci of restricted diffusion to suggest acute or subacute ischemia. Gray-white matter differentiation otherwise maintained. No other areas of encephalomalacia to suggest chronic cortical  infarction. No acute intracranial hemorrhage. Single punctate focus of susceptibility artifact noted within the right temporal lobe, consistent with a small chronic microhemorrhage, likely small vessel related (series 3, image 23). No other areas of chronic hemorrhage identified. No mass lesion, midline shift or mass effect. No hydrocephalus or extra-axial fluid collection. No intrinsic temporal lobe abnormality. Pituitary gland suprasellar region within normal limits. Midline structures intact. Vascular: Major intracranial vascular flow voids are maintained. Skull and upper cervical spine: Craniocervical junction within normal limits. Bone marrow signal intensity normal. No scalp soft tissue abnormality. Sinuses/Orbits: Patient status post bilateral ocular lens replacement. Globes and orbital soft tissues demonstrate no acute finding. Paranasal sinuses are largely clear. Moderate bilateral mastoid effusions. Visualized nasopharynx within normal limits. Other: None. IMPRESSION: 1. 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 2. Additional tiny 3 mm focus of diffusion abnormality involving the posterior left frontal cortex, suspicious for a tiny acute to early subacute ischemic infarct. 3. No other acute intracranial abnormality. 4. Age-related cerebral atrophy with chronic microvascular ischemic disease, mildly progressed from previous. Electronically Signed   By: Jeannine Boga M.D.   On: 12/14/2019 21:44   DG Chest Portable 1 View  Result Date: 12/14/2019 CLINICAL DATA:  Altered mental status with recent fall EXAM: PORTABLE CHEST 1 VIEW COMPARISON:  January 11, 2019 FINDINGS: There is mild atelectatic change in the left base with equivocal small left pleural effusion. Lungs elsewhere are clear. Heart is upper normal in size with pulmonary vascularity normal. No adenopathy. Degenerative change noted in the thoracic spine. IMPRESSION: Atelectatic change  left base with equivocal left pleural effusion. Lungs elsewhere clear. Stable cardiac silhouette. Electronically Signed   By: Lowella Grip III M.D.   On: 12/14/2019 13:36     Assessment/Plan:  79 y.o. male medical history significant of hypertension, hyperlipidemia, diabetes mellitus, COPD, asthma, OSA, former smoker, BPH, colon cancer, PAD, who presents with altered mental status, fall, and shaking. Patient initially could not give any history but this AM much more awake.  No prior hx of seizures but does state he has generalized tremors worse off on with movement   - He is much more awake and talking - tremors likely associated with esential tremors - no anti epileptics at this time - EEG  - MRI Punctate 3 mm acute ischemic nonhemorrhagic cortical infarct involving the right occipital lobe as well as tiny 3 mm focus of diffusion abnormality involving the posterior left frontal cortex - Strokes are likely in setting of small vessel disease rather then embolic in nature - pt is on ASA - There is possibility that L frontal cortex small stroke can cause irritation which will lead to seizures but it is very small  - will make decision on  anti epileptics after EEG is done 12/15/2019, 8:58 AM

## 2019-12-15 NOTE — Plan of Care (Signed)

## 2019-12-15 NOTE — Evaluation (Signed)
Physical Therapy Evaluation Patient Details Name: Justin Griffith MRN: [id number] DOB: 07-Jul-1940 Today's Date: 12/15/2019   History of Present Illness  Justin Griffith is a 79 y.o. male with medical history significant of hypertension, hyperlipidemia, diabetes mellitus, COPD, asthma, OSA, former smoker, BPH, colon cancer, PAD, who presents with altered mental status, fall, seizure-like activity. 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 Additional tiny 3 mm focus of diffusion abnormality involving the posterior left frontal cortex, suspicious for a tiny acute to early subacute ischemic infarct.  Clinical Impression  Pt was pleasant and motivated to participate during the session. PT demonstrated some abnormal eye coordination movements. Pt demonstrated normal smooth pursuits and vertical saccadic movements, but had very slow hortizontal saccadic movements. When performing VOR, pt's head turns were very slow and eye movements laged behind head movements. Eyes also bounced to catch up with head movements. This did not provoke the pt's migranes however.  Pt did not demonstrate any difference in LE or UE sensation. Pt indicated that he had swelling and seom pain in R calf. Upon insepction the area was swollen, red, warm and pt had positive homan's sign. Nursing and MD were notified about potential DVT. The evaluation was stopped. After PT left room, chair alarm went off. PT went in to find that pt had stood up from the chair and IND ambulated to the restroom. PT was able to observe pt ambulation as pt left restroom and sat back down in bed. All supervised tasks were performed IND and with mild symptoms of fatigue. Pt was educated about calling nursing if pt needed to move from the bed. Due to not being able to fully assess mobility, will recommend HHPT at this time. PT will reassess once pt is cleared by MD.       Follow Up Recommendations  Home health PT    Equipment Recommendations  Other (comment);None recommended by PT (unable to fully assess equipment need. will further assess upon next visit)    Recommendations for Other Services       Precautions / Restrictions Precautions Precautions: Fall;Other (comment) (Seizure, aspiration ) Restrictions Weight Bearing Restrictions: No      Mobility  Bed Mobility Overal bed mobility: Independent             General bed mobility comments: pt moved performed bed mobility IND. pt reports is keeping in a recliner at home and does not use a bed  Transfers Overall transfer level: Needs assistance Equipment used: None   Sit to Stand: Supervision         General transfer comment: pt observed to preform STS easily with no increased effort  Ambulation/Gait   Gait Distance (Feet): 10 Feet Assistive device: None Gait Pattern/deviations: WFL(Within Functional Limits);Wide base of support     General Gait Details: Was going to defer to potential DVT. However, pt stood up and walked to the restroom as soon at PT left the room. PT able to observe IND steady gait with wide base of support WFL.  Stairs            Wheelchair Mobility    Modified Rankin (Stroke Patients Only)       Balance Overall balance assessment: Needs assistance Sitting-balance support: Feet supported;No upper extremity supported Sitting balance-Leahy Scale: Normal     Standing balance support: No upper extremity supported Standing balance-Leahy Scale: Good Standing balance comment: pt able to maintain static and dynamic balance. Wide base of  support self chosen                             Pertinent Vitals/Pain Faces Pain Scale: Hurts a little bit Pain Location: Headache Pain Descriptors / Indicators: Headache Pain Intervention(s): Monitored during session;Limited activity within patient's tolerance    Home Living Family/patient expects to be discharged to:: Private  residence Living Arrangements: Spouse/significant other Available Help at Discharge: Family;Available 24 hours/day Type of Home: House Home Access: Level entry     Home Layout: One level Home Equipment: None Additional Comments: 2 falls and 1 almost fall during the last 6 months. 3 month history of migranes which were provked by light    Prior Function Level of Independence: Independent         Comments: pt able to "walk up and down [employer] with no problem"     Hand Dominance        Extremity/Trunk Assessment   Upper Extremity Assessment Upper Extremity Assessment: Overall WFL for tasks assessed    Lower Extremity Assessment Lower Extremity Assessment: Overall WFL for tasks assessed       Communication   Communication: HOH  Cognition Arousal/Alertness: Awake/alert Behavior During Therapy: WFL for tasks assessed/performed Overall Cognitive Status: No family/caregiver present to determine baseline cognitive functioning                                 General Comments: AxO x4. Pt did require Mod to Max VCs for tasks assessed      General Comments General comments (skin integrity, edema, etc.): pt needed Max cueing to complete VOR. head turns are very very slow and eye movements lag behind head. eyes occasionally bounce. pt displays very slow horitzontal saccadic eye movements but vertical saccadic eye movements were WNL. smooth pursuits were normal    Exercises Other Exercises Other Exercises: Pt screened for DVT and educated on potential condition   Assessment/Plan    PT Assessment Patient needs continued PT services  PT Problem List Decreased safety awareness;Decreased mobility       PT Treatment Interventions DME instruction;Therapeutic exercise;Gait training;Balance training;Stair training;Functional mobility training;Therapeutic activities    PT Goals (Current goals can be found in the Care Plan section)  Acute Rehab PT Goals Patient Stated  Goal: to return home PT Goal Formulation: With patient Time For Goal Achievement: 12/28/19 Potential to Achieve Goals: Good    Frequency Min 2X/week   Barriers to discharge        Co-evaluation               AM-PAC PT "6 Clicks" Mobility  Outcome Measure Help needed turning from your back to your side while in a flat bed without using bedrails?: None Help needed moving from lying on your back to sitting on the side of a flat bed without using bedrails?: None Help needed moving to and from a bed to a chair (including a wheelchair)?: None Help needed standing up from a chair using your arms (e.g., wheelchair or bedside chair)?: None Help needed to walk in hospital room?: A Little Help needed climbing 3-5 steps with a railing? : A Little 6 Click Score: 22    End of Session Equipment Utilized During Treatment: Gait belt Activity Tolerance: Patient tolerated treatment well;Treatment limited secondary to medical complications (Comment) (rest of evaluation deffered because of potential DVT) Patient left: in bed;with bed alarm set;with  call bell/phone within reach Nurse Communication: Mobility status;Other (comment) (nursing and MD notified about potential DVT in R leg) PT Visit Diagnosis: Muscle weakness (generalized) (M62.81);Unsteadiness on feet (R26.81)    Time: 8377-9396 PT Time Calculation (min) (ACUTE ONLY): 32 min   Charges:              Hervey Ard, SPT 12/15/19. 3:41 PM

## 2019-12-15 NOTE — Plan of Care (Signed)
Progressing toward goals. 

## 2019-12-15 NOTE — Progress Notes (Signed)
PROGRESS NOTE    TAJAE RYBICKI  BPZ:[id number] DOB: 21-Jul-1940 DOA: 12/14/2019 PCP: [name], NP   Brief Narrative:  HPI per Dr. Ivor Costa on 12/14/19  DANNIEL TONES is a 79 y.o. male with medical history significant of hypertension, hyperlipidemia, diabetes mellitus, COPD, asthma, OSA, former smoker, BPH, colon cancer, PAD, who presents with altered mental status, fall, seizure-like activity.  Patient has AMS, and is unable to provide accurate medical history. I called his daughter who also does not know the detail information.  Per report, patient was noted to be confused, complains of headache and had unwitnessed fall.  EMS report, patient had 1 episode of seizure-like activities.  Patient has mild cough, but does not have active respiratory distress, no active nausea, vomiting, diarrhea noted.  Patient denies chest pain or shortness of breath.  Not sure if patient has symptoms of UTI.  No facial droop or slurred speech.  He moves all extremities normally. When I saw pt in ED, he is confused. He knows his own name and knows that he is in hospital.  He is confused about time.   ED Course: pt was found to have WBC 5.6, pending urinalysis, negative Covid PCR, troponin level 29, 27, lactic acid 1.2, INR 1.1, electrolytes renal function okay, temperature normal, blood pressure 221/100, 176, 93, heart rate 52, RR 23, oxygen saturation 92% on room air, which improved to 100% on 2 L oxygen.  CT of the head is negative for acute intracranial abnormalities.  Chest x-ray showed atelectatic change left base with equivocal left pleural effusion. Pt is placed on MedSurg [place] for observation.  **Interim History  He was resting today and EEG was still pending to be done.  Echocardiogram was ordered given his syncopal episode just also pending to be done.  Incidentally he was found to have an acute small punctate infarcts in the setting of small vessel disease.  Neurology has no other recommendations  but recommending continuing aspirin.  EEG was normal and neurology recommending no antiepileptics but still needs echocardiogram done for syncope work-up and completion of stroke work-up.  His mental status is improved but he was asleep and somnolent and drowsy when I saw him.  Assessment & Plan:   Principal Problem:   Acute metabolic encephalopathy Active Problems:   Hypertension associated with diabetes ([place])   Type 2 diabetes mellitus with proteinuria (HCC)   Hyperlipidemia associated with type 2 diabetes mellitus (HCC)   BPH (benign prostatic hyperplasia)   COPD (chronic obstructive pulmonary disease) (HCC)   Fall   Elevated troponin   Syncope  Acute metabolic encephalopathy -Etiology is not clear.  Per EMS report, patient had episode of seizure-like activity but wife states that he collapsed.  -Does not have a history of seizures  -CT head is negative for acute intracranial abnormalities.   -Patient is taking primidone at home, not sure why patient is taking this medication.  Will continue this medication. -Placed on MedSurg for observation -Frequent neuro check -Get MRI of brain and EEG -MRI showed "Punctate 3 mm acute ischemic nonhemorrhagic cortical infarct involving the right occipital lobe. Additional tiny 3 mm focus of diffusion abnormality involving the posterior left frontal cortex, suspicious for a tiny acute to early subacute ischemic infarct.  No other acute intracranial abnormality. Age-related cerebral atrophy with chronic microvascular ischemic disease, mildly progressed from previous." -EEG done and showed "This study is within normal limits. No seizures or epileptiform discharges were seen throughout the recording."  -Seizure  precautions -When necessary Ativan for seizure -Check UDS and was Negative  -Follow-up urinalysis and was unremarkable but did show yellow-colored urine with 50 glucose and greater than 300 protein -Dr. Irish Elders of neurology saw in  consultation and recommends no antiepileptics at this time and likely feels that he would not need it given his EEG is normal  Hypertension associated with diabetes [facility]):  -Blood pressure elevated initially, 221/100 --> 176/93. -Held Lasix since patient has decreased oral intake due to altered mental status -Continue Benicar-HCTZ -IV hydralazine as needed will be increased to 10 mg and 2 every 4 hours as needed for his systolic blood pressure greater than 528 diastolic blood pressure greater than 100 -BP is now 191/81 this afternoon -Continue to monitor blood pressures per protocol  Type 2 diabetes mellitus with proteinuria ([place]) -Recent A1c 7.5, poorly controlled.  Patient taking Lantus at home -Continue with sliding scale insulin -Decrease Lantus from 80 to 50 units daily -CBGs ranging from 128-161  Hyperlipidemia associated with type 2 diabetes mellitus (HCC) -Lipid panel done it showed a total cholesterol/HDL ratio of 4.5, cholesterol level of 135, HDL 30, and LDL of 60, triglycerides of 226, and VLDL 45 -Continue with 20 mg p.o. daily  BPH (benign prostatic hyperplasia) -Currently getting tamsulosin 0.4 mg daily  COPD (chronic obstructive pulmonary disease) (HCC) -Continue with home bronchodilators and also start DuoNeb scheduled and budesonide  Fall -PT/OT evaluation recommending home health PT  Acute CVA -Small and MRI showed punctate 3 mm acute ischemic nonhemorrhagic cortical infarct involving the right occipital lobe as well as a tiny 3 mm focus of diffusion abnormality involving the posterior left frontal cortex -Echocardiogram ordered and pending -Neurology following and he is currently on aspirin and he has had a lipid panel done as well as a hemoglobin A1c-PT OT evaluated and recommending home health and SLP signed off -Neurology feels that this patient has stroke in the setting of a small vessel disease rather embolic in nature -Defer to neurology for further  recommendations  Elevated Troponin:  -Troponin 29 -->27.  Likely due to demand ischemia secondary to elevated blood pressure. -on ASA and crestor -Trend troponin -Check A1c, FLP -Get EKG now and repeat EKG in morning -Check UDS test was negative  Hyperbilirubinemia -Mild -T bili went from 1.2 and is open to next-continue monitor and trend repeat CMP in a.m.  Syncope and Collapse -Unknown Etiology -Admitted as Obs but will change to Inpatient as needs ECHO to be done -EEG done and unremarkable and MRI done as well and showed punctate hemorrhages -Trend troponins and ranging from 29 -> 7 -> 40 -PT OT recommending home health -Checked D-dimer and it was negative lower extremity venous duplex was negative for VTE -Echocardiogram pending to be done -We will need orthostatic vital signs done morning  Essential Tremors -Likely this is why he is on primidone -Continue to monitor and appreciate neurology consultation  Obesity -Estimated body mass index is 38.18 kg/m as calculated from the following:   Height as of this encounter: 6' (1.829 m).   Weight as of this encounter: 127.7 kg. -Weight Loss and Dietary Counseling given  DVT prophylaxis: Enoxaparin 40 mg sq q24h Code Status: FULL CODE Family Communication: No family present at bedside  Disposition Plan: Anticipating discharging home the next 24 to 48 hours with home health PT and OT when his CVA work-up is complete and his echocardiogram was done his orthostatics are redone in the morning  Status is: Inpatient  Remains inpatient  appropriate because:Unsafe d/c plan and Inpatient level of care appropriate due to severity of illness   Dispo: The patient is from: Home              Anticipated d/c is to: Home              Anticipated d/c date is: 1-2 days              Patient currently is not medically stable to d/c.   Consultants:   Neurology   Procedures: ECHOCARDIOGRAM ordered and pending to be  done  Antimicrobials:  Anti-infectives (From admission, onward)   Start     Dose/Rate Route Frequency Ordered Stop   12/14/19 2200  acyclovir (ZOVIRAX) 200 MG capsule 400 mg        400 mg Oral 2 times daily 12/14/19 2155          Subjective: Seen and examined at bedside and he was little somnolent and drowsy and I woke him from his sleep.  Did not realize that he had small strokes.  Felt okay.  No chest pain, lightheadedness or dizziness.  Does not know what happened and how he got to the hospital.  No other concerns or complaints at this time.  Objective: Vitals:   12/15/19 1115 12/15/19 1329 12/15/19 1405 12/15/19 1718  BP: (!) 172/75 (!) 173/77  (!) 191/81  Pulse: (!) 58 (!) 58  (!) 56  Resp: 16 16  16   Temp: 97.8 F (36.6 C) 98.2 F (36.8 C)  97.9 F (36.6 C)  TempSrc:      SpO2: 93% 97% 95% 98%  Weight:      Height:        Intake/Output Summary (Last 24 hours) at 12/15/2019 1835 Last data filed at 12/15/2019 0300 Gross per 24 hour  Intake --  Output 400 ml  Net -400 ml   Filed Weights   12/14/19 1240 12/15/19 0231  Weight: 133 kg 127.7 kg   Examination: Physical Exam:  Constitutional: WN/WD obese Caucasian male who is slightly somnolent and drowsy and in NAD and appears calm and comfortable Eyes: Lids and conjunctivae normal, sclerae anicteric  ENMT: External Ears, Nose appear normal. Grossly normal hearing.  Neck: Appears normal, supple, no cervical masses, normal ROM, no appreciable thyromegaly; no JVD Respiratory: Diminished to auscultation bilaterally, no wheezing, rales, rhonchi or crackles. Normal respiratory effort and patient is not tachypenic. No accessory muscle use.  Unlabored breathing Cardiovascular: RRR, no murmurs / rubs / gallops. S1 and S2 auscultated.  Abdomen: Soft, non-tender, distended secondary body habitus. Bowel sounds positive.  GU: Deferred. Musculoskeletal: No clubbing / cyanosis of digits/nails. No joint deformity upper and lower  extremities.  Skin: No rashes, lesions, ulcers on limited skin evaluation. No induration; Warm and dry.  Neurologic: CN 2-12 grossly intact with no focal deficits. Romberg sign and cerebellar reflexes not assessed.  Psychiatric: Normal judgment and insight. Alert and oriented x 3. Normal mood and appropriate affect.   Data Reviewed: I have personally reviewed following labs and imaging studies  CBC: Recent Labs  Lab 12/14/19 1249 12/15/19 0503  WBC 5.6 7.1  NEUTROABS 3.6  --   HGB 15.8 14.8  HCT 44.7 41.9  MCV 88.9 88.2  PLT 200 194   Basic Metabolic Panel: Recent Labs  Lab 12/14/19 1250 12/15/19 0503  NA 133* 137  K 3.9 3.6  CL 97* 100  CO2 28 29  GLUCOSE 201* 139*  BUN 13 15  CREATININE 0.89 1.02  CALCIUM 10.3 10.3  MG  --  1.9  PHOS  --  3.3   GFR: Estimated Creatinine Clearance: 81.1 mL/min (by C-G formula based on SCr of 1.02 mg/dL). Liver Function Tests: Recent Labs  Lab 12/14/19 1250 12/15/19 0503  AST 20 20  ALT 20 18  ALKPHOS 46 40  BILITOT 1.2 1.3*  PROT 7.0 6.2*  ALBUMIN 3.5 3.1*   No results for input(s): LIPASE, AMYLASE in the last 168 hours. No results for input(s): AMMONIA in the last 168 hours. Coagulation Profile: Recent Labs  Lab 12/14/19 1249  INR 1.1   Cardiac Enzymes: No results for input(s): CKTOTAL, CKMB, CKMBINDEX, TROPONINI in the last 168 hours. BNP (last 3 results) No results for input(s): PROBNP in the last 8760 hours. HbA1C: Recent Labs    12/15/19 0503  HGBA1C 7.5*   CBG: Recent Labs  Lab 12/14/19 1651 12/14/19 2222 12/15/19 0934 12/15/19 1126 12/15/19 1712  GLUCAP 170* 135* 144* 161* 128*   Lipid Profile: Recent Labs    12/15/19 0503  CHOL 135  HDL 30*  LDLCALC 60  TRIG 226*  CHOLHDL 4.5   Thyroid Function Tests: No results for input(s): TSH, T4TOTAL, FREET4, T3FREE, THYROIDAB in the last 72 hours. Anemia Panel: No results for input(s): VITAMINB12, FOLATE, FERRITIN, TIBC, IRON, RETICCTPCT in the  last 72 hours. Sepsis Labs: Recent Labs  Lab 12/14/19 1250  LATICACIDVEN 1.2    Recent Results (from the past 240 hour(s))  Respiratory Panel by RT PCR (Flu A&B, Covid) - Nasopharyngeal Swab     Status: None   Collection Time: 12/14/19  1:26 PM   Specimen: Nasopharyngeal Swab  Result Value Ref Range Status   SARS Coronavirus 2 by RT PCR NEGATIVE NEGATIVE Final    Comment: (NOTE) SARS-CoV-2 target nucleic acids are NOT DETECTED.  The SARS-CoV-2 RNA is generally detectable in upper respiratoy specimens during the acute phase of infection. The lowest concentration of SARS-CoV-2 viral copies this assay can detect is 131 copies/mL. A negative result does not preclude SARS-Cov-2 infection and should not be used as the sole basis for treatment or other patient management decisions. A negative result may occur with  improper specimen collection/handling, submission of specimen other than nasopharyngeal swab, presence of viral mutation(s) within the areas targeted by this assay, and inadequate number of viral copies (<131 copies/mL). A negative result must be combined with clinical observations, patient history, and epidemiological information. The expected result is Negative.  Fact Sheet for Patients:  PinkCheek.be  Fact Sheet for Healthcare Providers:  GravelBags.it  This test is no t yet approved or cleared by the [place] FDA and  has been authorized for detection and/or diagnosis of SARS-CoV-2 by FDA under an Emergency Use Authorization (EUA). This EUA will remain  in effect (meaning this test can be used) for the duration of the COVID-19 declaration under Section 564(b)(1) of the Act, 21 U.S.C. section 360bbb-3(b)(1), unless the authorization is terminated or revoked sooner.     Influenza A by PCR NEGATIVE NEGATIVE Final   Influenza B by PCR NEGATIVE NEGATIVE Final    Comment: (NOTE) The Xpert Xpress  SARS-CoV-2/FLU/RSV assay is intended as an aid in  the diagnosis of influenza from Nasopharyngeal swab specimens and  should not be used as a sole basis for treatment. Nasal washings and  aspirates are unacceptable for Xpert Xpress SARS-CoV-2/FLU/RSV  testing.  Fact Sheet for Patients: PinkCheek.be  Fact Sheet for Healthcare Providers: GravelBags.it  This test is not yet approved or  cleared by the [place] and  has been authorized for detection and/or diagnosis of SARS-CoV-2 by  FDA under an Emergency Use Authorization (EUA). This EUA will remain  in effect (meaning this test can be used) for the duration of the  Covid-19 declaration under Section 564(b)(1) of the Act, 21  U.S.C. section 360bbb-3(b)(1), unless the authorization is  terminated or revoked. Performed at [facility], [address] [place], [place] [address]      RN Pressure Injury Documentation:     Estimated body mass index is 38.18 kg/m as calculated from the following:   Height as of this encounter: 6' (1.829 m).   Weight as of this encounter: 127.7 kg.  Malnutrition Type:      Malnutrition Characteristics:      Nutrition Interventions:    Radiology Studies: EEG  Result Date: 12/15/2019 Lora Havens, MD     12/15/2019  5:25 PM Patient Name: BEN HABERMANN MRN: [id number] Epilepsy Attending: Lora Havens Referring Physician/Provider: Dr Hilton Sinclair Date: 12/15/2019 Duration: 20.39 mins Patient history: 79 y.o. male medical history significant ofhypertension, hyperlipidemia, diabetes mellitus, COPD, asthma, OSA, former smoker, BPH, colon cancer, PAD, who presents with altered mental status, fall, and shaking. EEG to evaluate for seizure. Level of alertness: Awake,asleep AEDs during EEG study: Gabapentin Technical aspects: This EEG study was done with scalp electrodes positioned according to the 10-20 International  system of electrode placement. Electrical activity was acquired at a sampling rate of 500Hz  and reviewed with a high frequency filter of 70Hz  and a low frequency filter of 1Hz . EEG data were recorded continuously and digitally stored. Description: The posterior dominant rhythm consists of 8 Hz activity of moderate voltage (25-35 uV) seen predominantly in posterior head regions, symmetric and reactive to eye opening and eye closing. Sleep was characterized by vertex waves, sleep spindles (12 to 14 Hz), maximal frontocentral region.   Physiologic photic driving was not seen during photic stimulation.  Hyperventilation was not performed.   IMPRESSION: This study is within normal limits. No seizures or epileptiform discharges were seen throughout the recording. Lora Havens   CT Head Wo Contrast  Result Date: 12/14/2019 CLINICAL DATA:  Mental status change, fall. EXAM: CT HEAD WITHOUT CONTRAST TECHNIQUE: Contiguous axial images were obtained from the base of the skull through the vertex without intravenous contrast. COMPARISON:  11/13/2019 FINDINGS: Brain: No evidence of acute large vascular territory infarction, hemorrhage, hydrocephalus, extra-axial collection or mass lesion/mass effect. Similar patchy white matter hypoattenuation, most likely the sequela of chronic microvascular ischemic disease. Vascular: Calcific intracranial atherosclerosis. Skull: Normal. Negative for fracture or focal lesion. Sinuses/Orbits: Mild ethmoid air cell mucosal thickening. Otherwise, the sinuses are clear. No acute orbital abnormality. Bilateral native lens replacements. Other: Partially absent right-sided ossicles, better characterized on prior CT temporal bone. Under pneumatization of bilateral mastoid air cells with bilateral chronic opacification. IMPRESSION: 1. No evidence of acute intracranial abnormality. 2. Chronic opacification of bilateral mastoid air cells. Electronically Signed   By: Margaretha Sheffield MD   On:  12/14/2019 13:26   MR BRAIN WO CONTRAST  Result Date: 12/14/2019 CLINICAL DATA:  Initial evaluation for acute seizure. EXAM: MRI HEAD WITHOUT CONTRAST TECHNIQUE: Multiplanar, multiecho pulse sequences of the brain and surrounding structures were obtained without intravenous contrast. COMPARISON:  Prior CT from earlier the same day as well as previous MRI from 04/21/2019. FINDINGS: Brain: Generalized age-related cerebral atrophy. Patchy and confluent T2/FLAIR hyperintensity within the periventricular deep white matter both cerebral  hemispheres, nonspecific, but most commonly related to chronic microvascular ischemic disease. Overall, appearance is mildly progressed from previous. Punctate 3 mm focus of restricted diffusion involving the cortical gray matter of the right occipital lobe consistent with a small acute ischemic infarct (series 5, image 26). Additional tiny 3 mm focus of diffusion abnormality at the posterior left frontal cortex also suspicious for a tiny acute to early subacute ischemic infarct (series 5, image 38). No associated hemorrhage or mass effect about these lesions. No other foci of restricted diffusion to suggest acute or subacute ischemia. Gray-white matter differentiation otherwise maintained. No other areas of encephalomalacia to suggest chronic cortical infarction. No acute intracranial hemorrhage. Single punctate focus of susceptibility artifact noted within the right temporal lobe, consistent with a small chronic microhemorrhage, likely small vessel related (series 3, image 23). No other areas of chronic hemorrhage identified. No mass lesion, midline shift or mass effect. No hydrocephalus or extra-axial fluid collection. No intrinsic temporal lobe abnormality. Pituitary gland suprasellar region within normal limits. Midline structures intact. Vascular: Major intracranial vascular flow voids are maintained. Skull and upper cervical spine: Craniocervical junction within normal limits.  Bone marrow signal intensity normal. No scalp soft tissue abnormality. Sinuses/Orbits: Patient status post bilateral ocular lens replacement. Globes and orbital soft tissues demonstrate no acute finding. Paranasal sinuses are largely clear. Moderate bilateral mastoid effusions. Visualized nasopharynx within normal limits. Other: None. IMPRESSION: 1. 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 2. Additional tiny 3 mm focus of diffusion abnormality involving the posterior left frontal cortex, suspicious for a tiny acute to early subacute ischemic infarct. 3. No other acute intracranial abnormality. 4. Age-related cerebral atrophy with chronic microvascular ischemic disease, mildly progressed from previous. Electronically Signed   By: Jeannine Boga M.D.   On: 12/14/2019 21:44   US Carotid Bilateral  Result Date: 12/15/2019 CLINICAL DATA:  Hypertension, stroke symptoms EXAM: BILATERAL CAROTID DUPLEX ULTRASOUND TECHNIQUE: Pearline Cables scale imaging, color Doppler and duplex ultrasound were performed of bilateral carotid and vertebral arteries in the neck. COMPARISON:  None. FINDINGS: Criteria: Quantification of carotid stenosis is based on velocity parameters that correlate the residual internal carotid diameter with NASCET-based stenosis levels, using the diameter of the distal internal carotid lumen as the denominator for stenosis measurement. The following velocity measurements were obtained: RIGHT ICA: 77/16 cm/sec CCA: 83/4 cm/sec SYSTOLIC ICA/CCA RATIO:  1.2 ECA: 224 cm/sec LEFT ICA: 75/10 cm/sec CCA: 19/62 cm/sec SYSTOLIC ICA/CCA RATIO:  1.0 ECA: 161 cm/sec RIGHT CAROTID ARTERY: Minor echogenic shadowing plaque formation. No hemodynamically significant right ICA stenosis, velocity elevation, or turbulent flow. Degree of narrowing less than 50%. RIGHT VERTEBRAL ARTERY:  Normal antegrade flow LEFT CAROTID ARTERY: Similar scattered minor echogenic plaque  formation. No hemodynamically significant left ICA stenosis, velocity elevation, or turbulent flow. LEFT VERTEBRAL ARTERY:  Normal antegrade flow IMPRESSION: Minor carotid atherosclerosis. No hemodynamically significant ICA stenosis. Degree of narrowing less than 50% bilaterally by ultrasound criteria. Patent antegrade vertebral flow bilaterally Electronically Signed   By: Jerilynn Mages.  Shick M.D.   On: 12/15/2019 09:20   US Venous Img Lower Bilateral (DVT)  Result Date: 12/15/2019 CLINICAL DATA:  79 year old male with history of leg pain for 1 month. EXAM: BILATERAL LOWER EXTREMITY VENOUS DOPPLER ULTRASOUND TECHNIQUE: Gray-scale sonography with graded compression, as well as color Doppler and duplex ultrasound were performed to evaluate the lower extremity deep venous systems from the level of the common femoral vein and including the common femoral, femoral, profunda femoral, popliteal and  calf veins including the posterior tibial, peroneal and gastrocnemius veins when visible. The superficial great saphenous vein was also interrogated. Spectral Doppler was utilized to evaluate flow at rest and with distal augmentation maneuvers in the common femoral, femoral and popliteal veins. COMPARISON:  None. FINDINGS: RIGHT LOWER EXTREMITY Common Femoral Vein: No evidence of thrombus. Normal compressibility, respiratory phasicity and response to augmentation. Saphenofemoral Junction: No evidence of thrombus. Normal compressibility and flow on color Doppler imaging. Profunda Femoral Vein: No evidence of thrombus. Normal compressibility and flow on color Doppler imaging. Femoral Vein: No evidence of thrombus. Normal compressibility, respiratory phasicity and response to augmentation. Popliteal Vein: No evidence of thrombus. Normal compressibility, respiratory phasicity and response to augmentation. Calf Veins: No evidence of thrombus. Normal compressibility and flow on color Doppler imaging. Superficial Great Saphenous Vein: No  evidence of thrombus. Normal compressibility. Other Findings:  None. LEFT LOWER EXTREMITY Common Femoral Vein: No evidence of thrombus. Normal compressibility, respiratory phasicity and response to augmentation. Saphenofemoral Junction: No evidence of thrombus. Normal compressibility and flow on color Doppler imaging. Profunda Femoral Vein: No evidence of thrombus. Normal compressibility and flow on color Doppler imaging. Femoral Vein: No evidence of thrombus. Normal compressibility, respiratory phasicity and response to augmentation. Popliteal Vein: No evidence of thrombus. Normal compressibility, respiratory phasicity and response to augmentation. Calf Veins: No evidence of thrombus. Normal compressibility and flow on color Doppler imaging. Superficial Great Saphenous Vein: No evidence of thrombus. Normal compressibility. Other Findings:  None. IMPRESSION: No evidence of bilateral lower extremity deep venous thrombosis. Electronically Signed   By: Ruthann Cancer MD   On: 12/15/2019 16:23   DG Chest Portable 1 View  Result Date: 12/14/2019 CLINICAL DATA:  Altered mental status with recent fall EXAM: PORTABLE CHEST 1 VIEW COMPARISON:  January 11, 2019 FINDINGS: There is mild atelectatic change in the left base with equivocal small left pleural effusion. Lungs elsewhere are clear. Heart is upper normal in size with pulmonary vascularity normal. No adenopathy. Degenerative change noted in the thoracic spine. IMPRESSION: Atelectatic change left base with equivocal left pleural effusion. Lungs elsewhere clear. Stable cardiac silhouette. Electronically Signed   By: Lowella Grip III M.D.   On: 12/14/2019 13:36   Scheduled Meds: . acyclovir  400 mg Oral BID  . aspirin  300 mg Rectal Daily   Or  . aspirin EC  325 mg Oral Daily  . azelastine  2 spray Each Nare BID  . budesonide  0.25 mg Inhalation BID  . carvedilol  25 mg Oral BID WC  . cholecalciferol  50 mcg Oral Daily  . enoxaparin (LOVENOX) injection   40 mg Subcutaneous Q24H  . gabapentin  300 mg Oral QHS  . hydrochlorothiazide  25 mg Oral Daily  . insulin aspart  0-5 Units Subcutaneous QHS  . insulin aspart  0-9 Units Subcutaneous TID WC  . insulin glargine  50 Units Subcutaneous Daily  . ipratropium-albuterol  3 mL Nebulization Q6H  . irbesartan  300 mg Oral Daily  . mupirocin ointment  1 application Topical BID  . primidone  50 mg Oral BID  . rosuvastatin  20 mg Oral QHS  . tamsulosin  0.4 mg Oral Daily  . vitamin B-12  1,000 mcg Oral Daily   Continuous Infusions:   LOS: 0 days   Kerney Elbe, DO [facility] PAGER is on AMION  If 7PM-7AM, please contact night-coverage [email or web address]

## 2019-12-15 NOTE — Progress Notes (Signed)
Rasha from lab called to notify that pts troponin at 40. Increase, but not critical value.

## 2019-12-15 NOTE — Progress Notes (Signed)
Pt BP 191/81. Hydralazine 10mg  IV ordered. Unable to give hydralazine at this time as pt has lost IV access. Consult for IV team placed due to hard stick.

## 2019-12-15 NOTE — Progress Notes (Addendum)
SLP Cancellation Note  Patient Details Name: KYLIN DUBS MRN: [id number] DOB: [birth year]   Cancelled treatment:       Reason Eval/Treat Not Completed: SLP screened, no needs identified, will sign off   Chart reviewed, nursing consulted and nursing administered [place] after SLP asking them to completed. Pt passed [place] and nursing advanced pt's diet. No concerns noted.     Zyen Triggs B. Rutherford Nail M.S., CCC-SLP, [place] [phone]    Stormy Fabian 12/15/2019, 2:46 PM

## 2019-12-15 NOTE — Evaluation (Signed)
Occupational Therapy Evaluation Patient Details Name: Justin Griffith MRN: [id number] DOB: 1940/06/27 Today's Date: 12/15/2019    History of Present Illness Justin Griffith is a 79 y.o. male with medical history significant of hypertension, hyperlipidemia, diabetes mellitus, COPD, asthma, OSA, former smoker, BPH, colon cancer, PAD, who presents with altered mental status, fall, seizure-like activity. 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 Additional tiny 3 mm focus of diffusion abnormality involving the posterior left frontal cortex, suspicious for a tiny acute to early subacute ischemic infarct.   Clinical Impression   Justin Griffith was seen for OT evaluation this date. Prior to hospital admission, pt was Independent in ADLs and mobility. Pt lives c wife in [facility]. Pt presents to acute OT demonstrating impaired ADL performance and functional mobility 2/2 decreased activity tolerance, functional balance deficits, and visual deficts. Upon arrival pt asleep, waking to touch only. Pt somnolent t/o session when not engaged in tasks/conversation. Required repetition/redirection to PLOF/home setup questions. Pt AxO x4, easily reads clock across room, and requires VCs to read buttons on remote. Pt currently requires SUPERVISION don B socks seated EOB. SBA for SPT bed>chair. Pt would benefit from skilled OT to address noted impairments and functional limitations (see below for any additional details) in order to maximize safety and independence while minimizing falls risk and caregiver burden. Upon hospital discharge, recommend no OT follow up.      Follow Up Recommendations  No OT follow up;Supervision - Intermittent    Equipment Recommendations  Tub/shower seat    Recommendations for Other Services       Precautions / Restrictions Precautions Precautions: Fall;Other (comment) (Seizure, aspiration ) Restrictions Weight Bearing  Restrictions: No      Mobility Bed Mobility Overal bed mobility: Modified Independent    General bed mobility comments: Increased time, moved easily to EOB  Transfers Overall transfer level: Needs assistance Equipment used: None Transfers: Sit to/from [employer] Transfers Sit to Stand: Supervision Stand pivot transfers: Supervision       General transfer comment: SBA for SPT bed>chair     Balance Overall balance assessment: Needs assistance Sitting-balance support: No upper extremity supported;Feet supported Sitting balance-Leahy Scale: Good     Standing balance support: No upper extremity supported Standing balance-Leahy Scale: Fair      ADL either performed or assessed with clinical judgement   ADL Overall ADL's : Needs assistance/impaired        General ADL Comments: SUPERVISION don B socks seated EOB. SBA for ADL t/f. Anticipate CGA standing grooming tasks.      Vision Baseline Vision/History: No visual deficits Patient Visual Report: Other (comment) (Migraines c light ) Additional Comments: Mild visual deficits noted - to be further assessed in functional context. Easily reads clock across room, VCs to read buttons on remote.             Pertinent Vitals/Pain Pain Assessment: Faces Faces Pain Scale: Hurts a little bit Pain Location: Headache Pain Descriptors / Indicators: Headache Pain Intervention(s): Limited activity within patient's tolerance;Repositioned     Hand Dominance     Extremity/Trunk Assessment Upper Extremity Assessment Upper Extremity Assessment: Overall WFL for tasks assessed   Lower Extremity Assessment Lower Extremity Assessment: Overall WFL for tasks assessed       Communication Communication Communication: HOH   Cognition Arousal/Alertness: Awake/alert Behavior During Therapy: Flat affect Overall Cognitive Status: No family/caregiver present to determine baseline cognitive functioning      General Comments: AxO  x4, pt somnolent when not engaged in tasks/conversation. Required repetition/redirection to answer questions (cognition vs hearing?)      Exercises Exercises: Other exercises Other Exercises Other Exercises: Pt educated re: OT role, stroke education, falls prevention, ECS Other Exercises: LBD, sup>sit, sit<>stand, SPT, sitting/standing balance/tolerance   Shoulder Instructions      Home Living Family/patient expects to be discharged to:: Private residence Living Arrangements: Spouse/significant other Available Help at Discharge: Family;Available 24 hours/day Type of Home: House Home Access: Level entry     Home Layout: One level     Bathroom Shower/Tub: Walk-in shower         Home Equipment: None          Prior Functioning/Environment Level of Independence: Independent                 OT Problem List: Impaired balance (sitting and/or standing);Decreased activity tolerance;Impaired vision/perception;Decreased safety awareness      OT Treatment/Interventions: Self-care/ADL training;Therapeutic exercise;Energy conservation;DME and/or AE instruction;Therapeutic activities;Balance training;Cognitive remediation/compensation;Visual/perceptual remediation/compensation;Patient/family education    OT Goals(Current goals can be found in the care plan section) Acute Rehab OT Goals Patient Stated Goal: To drink water  OT Goal Formulation: With patient Time For Goal Achievement: 12/29/19 Potential to Achieve Goals: Good ADL Goals Pt Will Perform Grooming: Independently;standing Pt Will Transfer to Toilet: Independently;ambulating;regular height toilet Additional ADL Goal #1: Pt will Independently verbalize plan to implement x3 ECS  OT Frequency: Min 1X/week    AM-PAC OT "6 Clicks" Daily Activity     Outcome Measure Help from another person eating meals?: None Help from another person taking care of personal grooming?: A Little Help from another person toileting, which  includes using toliet, bedpan, or urinal?: A Little Help from another person bathing (including washing, rinsing, drying)?: A Little Help from another person to put on and taking off regular upper body clothing?: None Help from another person to put on and taking off regular lower body clothing?: A Little 6 Click Score: 20   End of Session    Activity Tolerance: Patient tolerated treatment well Patient left: in chair;with call bell/phone within reach;with chair alarm set  OT Visit Diagnosis: Other abnormalities of gait and mobility (R26.89)                Time: 2947-6546 OT Time Calculation (min): 16 min Charges:  OT General Charges $OT Visit: 1 Visit OT Evaluation $OT Eval Moderate Complexity: 1 Mod OT Treatments $Self Care/Home Management : 8-22 mins  Dessie Coma, M.S. OTR/L  12/15/19, 11:20 AM  ascom [phone]

## 2019-12-15 NOTE — Progress Notes (Signed)
PHARMACIST - PHYSICIAN COMMUNICATION  CONCERNING:  Enoxaparin (Lovenox) for DVT Prophylaxis   RECOMMENDATION: Patient was prescribed enoxaprin 40mg  q12 hours for VTE prophylaxis.   Filed Weights   12/14/19 1240 12/15/19 0231  Weight: 133 kg (293 lb 3.4 oz) 127.7 kg (281 lb 8.4 oz)    Body mass index is 38.18 kg/m.  Estimated Creatinine Clearance: 81.1 mL/min (by C-G formula based on SCr of 1.02 mg/dL).  Based on [place] patient should be receiving enoxaparin 40mg  every 24 hours due to BMI being <40.  DESCRIPTION: Pharmacy has adjusted enoxaparin dose per [facility] policy.  Patient is now receiving enoxaparin 40 mg every 24 hours    Pernell Dupre, PharmD, BCPS Clinical Pharmacist 12/15/2019 8:58 AM

## 2019-12-15 NOTE — Progress Notes (Signed)
Pt unavailable for 1530 VS and neuro checks.

## 2019-12-16 ENCOUNTER — Inpatient Hospital Stay
Admit: 2019-12-16 | Discharge: 2019-12-16 | Disposition: A | Discharge status: Home / Self Care | Payer: Medicare Other | Attending: Internal Medicine | Admitting: Internal Medicine

## 2019-12-16 ENCOUNTER — Inpatient Hospital Stay: Payer: Medicare Other

## 2019-12-16 DIAGNOSIS — R296 Repeated falls: Secondary | ICD-10-CM

## 2019-12-16 LAB — GLUCOSE, CAPILLARY
Glucose-Capillary: 147 mg/dL — ABNORMAL HIGH (ref 70–99)
Glucose-Capillary: 213 mg/dL — ABNORMAL HIGH (ref 70–99)
Glucose-Capillary: 141 mg/dL — ABNORMAL HIGH (ref 70–99)
Glucose-Capillary: 177 mg/dL — ABNORMAL HIGH (ref 70–99)
Glucose-Capillary: 177 mg/dL — ABNORMAL HIGH (ref 70–99)

## 2019-12-16 LAB — COMPREHENSIVE METABOLIC PANEL
ALT: 17 U/L (ref 0–44)
AST: 14 U/L — ABNORMAL LOW (ref 15–41)
Albumin: 3.1 g/dL — ABNORMAL LOW (ref 3.5–5.0)
Alkaline Phosphatase: 46 U/L (ref 38–126)
Anion gap: 8 (ref 5–15)
BUN: 18 mg/dL (ref 8–23)
CO2: 29 mmol/L (ref 22–32)
Calcium: 10.3 mg/dL (ref 8.9–10.3)
Chloride: 101 mmol/L (ref 98–111)
Creatinine, Ser: 1.09 mg/dL (ref 0.61–1.24)
GFR calc Af Amer: >60 mL/min (ref >60)
GFR calc non Af Amer: >60 mL/min (ref >60)
Glucose, Bld: 138 mg/dL — ABNORMAL HIGH (ref 70–99)
Potassium: 3.7 mmol/L (ref 3.5–5.1)
Sodium: 138 mmol/L (ref 135–145)
Total Bilirubin: 0.9 mg/dL (ref 0.3–1.2)
Total Protein: 6.1 g/dL — ABNORMAL LOW (ref 6.5–8.1)

## 2019-12-16 LAB — CBC WITH DIFFERENTIAL/PLATELET
Abs Immature Granulocytes: 0.01 10*3/uL (ref 0.00–0.07)
Basophils Absolute: 0.1 10*3/uL (ref 0.0–0.1)
Basophils Relative: 1 %
Eosinophils Absolute: 0.1 10*3/uL (ref 0.0–0.5)
Eosinophils Relative: 2 %
HCT: 40.4 % (ref 39.0–52.0)
Hemoglobin: 14.3 g/dL (ref 13.0–17.0)
Immature Granulocytes: 0 %
Lymphocytes Relative: 31 %
Lymphs Abs: 2 10*3/uL (ref 0.7–4.0)
MCH: 31.7 pg (ref 26.0–34.0)
MCHC: 35.4 g/dL (ref 30.0–36.0)
MCV: 89.6 fL (ref 80.0–100.0)
Monocytes Absolute: 0.8 10*3/uL (ref 0.1–1.0)
Monocytes Relative: 11 %
Neutro Abs: 3.6 10*3/uL (ref 1.7–7.7)
Neutrophils Relative %: 55 %
Platelets: 185 10*3/uL (ref 150–400)
RBC: 4.51 MIL/uL (ref 4.22–5.81)
RDW: 12.9 % (ref 11.5–15.5)
WBC: 6.6 10*3/uL (ref 4.0–10.5)
nRBC: 0 % (ref 0.0–0.2)

## 2019-12-16 LAB — ECHOCARDIOGRAM COMPLETE
AR max vel: 1.74 cm2
AV Area VTI: 1.92 cm2
AV Area mean vel: 1.92 cm2
AV Mean grad: 14.3 mmHg
AV Peak grad: 25.6 mmHg
Ao pk vel: 2.53 m/s
Area-P 1/2: 3.3 cm2
Height: 72 in
S' Lateral: 3.13 cm
Weight: 4504.44 oz

## 2019-12-16 LAB — PHOSPHORUS: Phosphorus: 3.4 mg/dL (ref 2.5–4.6)

## 2019-12-16 LAB — MAGNESIUM: Magnesium: 2 mg/dL (ref 1.7–2.4)

## 2019-12-16 MED ORDER — IPRATROPIUM-ALBUTEROL 0.5-2.5 (3) MG/3ML IN SOLN
3.0000 mL | Freq: Two times a day (BID) | RESPIRATORY_TRACT | Status: DC
  Administered 2019-12-17: 3 mL via RESPIRATORY_TRACT
  Filled 2019-12-16: qty 3

## 2019-12-16 NOTE — Progress Notes (Signed)
   12/16/19 0915  What Happened  Was fall witnessed? No  Was patient injured? No  Patient found on floor  Found by Staff-comment (Megan NT)  Stated prior activity other (comment) (sitting up on side of bed to eat breakfast, slid out of bed)  Follow Up  MD notified Alfredia Ferguson MD  Time MD notified [phone]  Family notified No - patient refusal  Additional tests No  Simple treatment Other (comment) (none, no injuries, bed alarm on, call bell in reach)  Progress note created (see row info) Yes  Adult Fall Risk Assessment  Risk Factor Category (scoring not indicated) Fall has occurred during this admission (document High fall risk)  Patient Fall Risk Level High fall risk  Adult Fall Risk Interventions  Required Bundle Interventions *See Row Information* High fall risk - low, moderate, and high requirements implemented  Additional Interventions Use of appropriate toileting equipment (bedpan, BSC, etc.);PT/OT need assessed if change in mobility from baseline  Screening for Fall Injury Risk (To be completed on HIGH fall risk patients) - Assessing Need for Low Bed  Risk For Fall Injury- Low Bed Criteria Previous fall this admission  Will Implement Low Bed and Floor Mats Yes (low bed ordered)  Pain Assessment  Pain Scale 0-10  Pain Score 0  PCA/Epidural/Spinal Assessment  Respiratory Pattern Regular;Unlabored  Neurological  Neuro (WDL) WDL  Level of Consciousness Alert  Orientation Level Oriented X4  R Hand Grip Moderate  L Hand Grip Moderate   Right Pronator Drift Absent  Left Pronator Drift Absent  R Foot Dorsiflexion Moderate  L Foot Dorsiflexion Moderate  R Foot Plantar Flexion Moderate  L Foot Plantar Flexion Moderate  RUE Motor Strength 5  LUE Motor Strength 5  RLE Motor Strength 5  LLE Motor Strength 5  Neuro Symptoms None  Seizure Activity  Psychomotor Symptoms None  Glasgow Coma Scale  Eye Opening 4  Best Verbal Response (NON-intubated) 5  Best Motor Response 6  Glasgow  Coma Scale Score 15  NIH Stroke Scale ( + Modified Stroke Scale Criteria)   Interval Shift assessment  Level of Consciousness (1a.)    0  LOC Questions (1b. )   + 0  LOC Commands (1c. )   +  0  Best Gaze (2. )  + 0  Visual (3. )  + 0  Facial Palsy (4. )     0  Motor Arm, Left (5a. )   + 0  Motor Arm, Right (5b. )   + 0  Motor Leg, Left (6a. )   + 0  Motor Leg, Right (6b. )   + 0  Limb Ataxia (7. ) 0  Sensory (8. )   + 0  Best Language (9. )   + 0  Dysarthria (10. ) 0  Extinction/Inattention (11.)   + 0  Modified SS Total  + 0  Complete NIHSS TOTAL 0  Musculoskeletal  Musculoskeletal (WDL) X  Assistive Device Front wheel walker  Generalized Weakness Yes  Weight Bearing Restrictions No  Integumentary  Integumentary (WDL) WDL

## 2019-12-16 NOTE — Progress Notes (Signed)
Pts BP 167/79 HR 58. MD notified. Per MD hold scheduled [facility].

## 2019-12-16 NOTE — Progress Notes (Signed)
PROGRESS NOTE    OLSON LUCARELLI  ZOX:[id number] DOB: 02/06/1941 DOA: 12/14/2019 PCP: [name], NP   Brief Narrative:  HPI per Dr. Ivor Costa on 12/14/19  LORENE SAMAAN is a 79 y.o. male with medical history significant of hypertension, hyperlipidemia, diabetes mellitus, COPD, asthma, OSA, former smoker, BPH, colon cancer, PAD, who presents with altered mental status, fall, seizure-like activity.  Patient has AMS, and is unable to provide accurate medical history. I called his daughter who also does not know the detail information.  Per report, patient was noted to be confused, complains of headache and had unwitnessed fall.  EMS report, patient had 1 episode of seizure-like activities.  Patient has mild cough, but does not have active respiratory distress, no active nausea, vomiting, diarrhea noted.  Patient denies chest pain or shortness of breath.  Not sure if patient has symptoms of UTI.  No facial droop or slurred speech.  He moves all extremities normally. When I saw pt in ED, he is confused. He knows his own name and knows that he is in hospital.  He is confused about time.   ED Course: pt was found to have WBC 5.6, pending urinalysis, negative Covid PCR, troponin level 29, 27, lactic acid 1.2, INR 1.1, electrolytes renal function okay, temperature normal, blood pressure 221/100, 176, 93, heart rate 52, RR 23, oxygen saturation 92% on room air, which improved to 100% on 2 L oxygen.  CT of the head is negative for acute intracranial abnormalities.  Chest x-ray showed atelectatic change left base with equivocal left pleural effusion. Pt is placed on MedSurg [place] for observation.  **Interim History  Echocardiogram was ordered given his syncopal episode showed an EF of 60 to 65% with indeterminate left diastolic parameters.  Incidentally he was found to have an acute small punctate infarcts in the setting of small vessel disease.  Neurology has no other recommendations but recommending  continuing aspirin.  EEG was normal and neurology recommending no antiepileptics but still needs echocardiogram done for syncope work-up and completion of stroke work-up.  His mental status is improved but today he had a unwitnessed fall so we will order another head CT scan and it showed "No evidence of acute intracranial abnormality. Small right posterior scalp contusion without underlying fracture."  Labs are stable and orthostatic vital signs watch him overnight again and obtain PT OT evaluation in the morning.  He continues to remain a little drowsy but he answers questions appropriately.  Assessment & Plan:   Principal Problem:   Acute metabolic encephalopathy Active Problems:   Hypertension associated with diabetes ([place])   Type 2 diabetes mellitus with proteinuria (HCC)   Hyperlipidemia associated with type 2 diabetes mellitus (HCC)   BPH (benign prostatic hyperplasia)   COPD (chronic obstructive pulmonary disease) (HCC)   Fall   Elevated troponin   Syncope  Acute metabolic encephalopathy, improving -Etiology is not clear.  Per EMS report, patient had episode of seizure-like activity but wife states that he collapsed.  -Does not have a history of seizures  -CT head is negative for acute intracranial abnormalities.  Repeated head CT as he had a unwitnessed fall this morning and showed "No evidence of acute intracranial abnormality. 2. Small right posterior scalp contusion without underlying fracture." -Patient is taking primidone at home, not sure why patient is taking this medication.  Will continue this medication. -Placed on MedSurg for observation -Frequent neuro check -Get MRI of brain and EEG -MRI showed "Punctate 3 mm  acute ischemic nonhemorrhagic cortical infarct involving the right occipital lobe. Additional tiny 3 mm focus of diffusion abnormality involving the posterior left frontal cortex, suspicious for a tiny acute to early subacute ischemic infarct.  No other acute  intracranial abnormality. Age-related cerebral atrophy with chronic microvascular ischemic disease, mildly progressed from previous." -EEG done and showed "This study is within normal limits. No seizures or epileptiform discharges were seen throughout the recording."  -Seizure precautions -When necessary Ativan for seizure -Check UDS and was Negative  -Follow-up urinalysis and was unremarkable but did show yellow-colored urine with 50 glucose and greater than 300 protein -Dr. Irish Elders of neurology saw in consultation and recommends no antiepileptics at this time and likely feels that he would not need it given his EEG is normal -We will get PT OT to evaluate and treat Continue with delirium precautions -continue monitor and his mental status is improving however he still remains a little drowsy today -Echocardiogram done below  Hypertension associated with diabetes ([place]):  -Blood pressure elevated initially, 221/100 --> 176/93. -Held Lasix since patient has decreased oral intake due to altered mental status -Continue Benicar-HCTZ but will discontinue the HCTZ component -IV hydralazine as needed will be increased to 10 mg and 2 every 4 hours as needed for his systolic blood pressure greater than 431 diastolic blood pressure greater than 100 -BP is now 167/79 -Continue to monitor blood pressures per protocol  Type 2 diabetes mellitus with proteinuria ([place]) -Recent A1c 7.5, poorly controlled.  Patient taking Lantus at home -Continue with sliding scale insulin -Decrease Lantus from 80 to 50 units daily -CBGs ranging from 141-262  Hyperlipidemia associated with type 2 diabetes mellitus (HCC) -Lipid panel done it showed a total cholesterol/HDL ratio of 4.5, cholesterol level of 135, HDL 30, and LDL of 60, triglycerides of 226, and VLDL 45 -Continue with Rosuvastatin 20 mg p.o. daily  BPH (benign prostatic hyperplasia) -Currently getting tamsulosin 0.4 mg daily  COPD (chronic  obstructive pulmonary disease) (HCC) -Continue with home bronchodilators and also start DuoNeb scheduled and budesonide  Fall in outpatient setting and unwitnessed fall in the hospital -PT/OT evaluation recommending home health PT yesterday but PT to reevaluate today -Repeat head CT scan today showed "No evidence of acute intracranial abnormality. 2. Small right posterior scalp contusion without underlying Fracture." -We will need PT OT prior to discharging  Acute CVA -Small and MRI showed punctate 3 mm acute ischemic nonhemorrhagic cortical infarct involving the right occipital lobe as well as a tiny 3 mm focus of diffusion abnormality involving the posterior left frontal cortex -Echocardiogram ordered an EF of 60 to 65% with no regional wall motion abnormalities but there was moderate left ventricular hypertrophy and left ventricular diastolic parameters were indeterminate.  Right ventricular systolic function was normal and he did have an AV valve that was thickened and calcified with mildly restricted motion.  Aortic valve regurgitation was not visualized and he had mild aortic valve stenosis. -Neurology following and he is currently on aspirin and he has had a lipid panel done as well as a hemoglobin A1c-PT OT evaluated and recommending home health and SLP signed off but patient had a unwitnessed fall today so we will have PT OT reevaluate again -Neurology feels that this patient has stroke in the setting of a small vessel disease rather embolic in nature -Defer to neurology for further recommendations  Elevated Troponin:  -Troponin 29 -->27.  Likely due to demand ischemia secondary to elevated blood pressure. -on ASA and crestor -Trend  troponin -Check A1c, FLP -Get EKG now and repeat EKG in morning -Check UDS test was negative  Hyperbilirubinemia -Mild -T bili went from 1.2 and is open to next-continue monitor and trend repeat CMP in a.m.  Syncope and Collapse -Unknown  Etiology -Admitted as Obs but will change to Inpatient as needs ECHO to be done -EEG done and unremarkable and MRI done as well and showed punctate hemorrhages -Trend troponins and ranging from 29 -> 7 -> 40 -PT OT recommending home health -Checked D-dimer and it was negative lower extremity venous duplex was negative for VTE -Echocardiogram pending to be done -We will need orthostatic vital signs done morning  Essential Tremors -Likely this is why he is on primidone -Continue to monitor and appreciate neurology consultation -Continue to monitor carefully  Obesity -Estimated body mass index is 38.18 kg/m as calculated from the following:   Height as of this encounter: 6' (1.829 m).   Weight as of this encounter: 127.7 kg. -Weight Loss and Dietary Counseling given  DVT prophylaxis: Enoxaparin 40 mg sq q24h Code Status: FULL CODE Family Communication: No family present at bedside  Disposition Plan: Anticipating discharging home the next 24 to 48 hours with home health PT and OT when his CVA work-up is complete and his echocardiogram was done his orthostatics are redone in the morning; orthostatics are still pending to be done  Status is: Inpatient  Remains inpatient appropriate because:Unsafe d/c plan and Inpatient level of care appropriate due to severity of illness   Dispo: The patient is from: Home              Anticipated d/c is to: Home              Anticipated d/c date is: 1-2 days              Patient currently is not medically stable to d/c.   Consultants:   Neurology   Procedures: ECHOCARDIOGRAM IMPRESSIONS    1. Left ventricular ejection fraction, by estimation, is 60 to 65%. The  left ventricle has normal function. The left ventricle has no regional  wall motion abnormalities. There is moderate left ventricular hypertrophy.  Left ventricular diastolic  parameters are indeterminate.  2. Right ventricular systolic function is normal. The right ventricular   size is normal.  3. The mitral valve is normal in structure. Trivial mitral valve  regurgitation.  4. AV is thickened, calcified with mildly restricted motion. Peak and  mean gradients through the AV are 26 and 14 mm Hg consistent with mild AS.  Marland Kitchen The aortic valve is abnormal. Aortic valve regurgitation is not  visualized. Mild aortic valve stenosis.  5. The inferior vena cava is dilated in size with >50% respiratory  variability, suggesting right atrial pressure of 8 mmHg.   FINDINGS  Left Ventricle: Left ventricular ejection fraction, by estimation, is 60  to 65%. The left ventricle has normal function. The left ventricle has no  regional wall motion abnormalities. The left ventricular internal cavity  size was normal in size. There is  moderate left ventricular hypertrophy. Left ventricular diastolic  parameters are indeterminate.   Right Ventricle: The right ventricular size is normal. No increase in  right ventricular wall thickness. Right ventricular systolic function is  normal.   Left Atrium: Left atrial size was normal in size.   Right Atrium: Right atrial size was normal in size.   Pericardium: There is no evidence of pericardial effusion.   Mitral Valve: The mitral valve  is normal in structure. Trivial mitral  valve regurgitation.   Tricuspid Valve: The tricuspid valve is normal in structure. Tricuspid  valve regurgitation is trivial.   Aortic Valve: AV is thickened, calcified with mildly restricted motion.  Peak and mean gradients through the AV are 26 and 14 mm Hg consistent with  mild AS. The aortic valve is abnormal. Aortic valve regurgitation is not  visualized. Mild aortic stenosis  is present. Aortic valve mean gradient measures 14.2 mmHg. Aortic valve  peak gradient measures 25.6 mmHg. Aortic valve area, by VTI measures 1.92  cm.   Pulmonic Valve: The pulmonic valve was not well visualized. Pulmonic valve  regurgitation is not visualized.    Aorta: The aortic root is normal in size and structure.   Venous: The inferior vena cava is dilated in size with greater than 50%  respiratory variability, suggesting right atrial pressure of 8 mmHg.   IAS/Shunts: No atrial level shunt detected by color flow Doppler.     LEFT VENTRICLE  PLAX 2D  LVIDd:     4.67 cm Diastology  LVIDs:     3.13 cm LV e' medial:  4.57 cm/s  LV PW:     1.51 cm LV E/e' medial: 23.9  LV IVS:    1.56 cm LV e' lateral:  5.77 cm/s  LVOT diam:   2.10 cm LV E/e' lateral: 18.9  LV SV:     101  LV SV Index:  41  LVOT Area:   3.46 cm     RIGHT VENTRICLE       IVC  RV S prime:   14.00 cm/s IVC diam: 2.32 cm  TAPSE (M-mode): 2.8 cm   LEFT ATRIUM      Index    RIGHT ATRIUM      Index  LA diam:   4.60 cm 1.87 cm/m RA Area:   23.90 cm  LA Vol (A4C): 76.9 ml 31.21 ml/m RA Volume:  75.60 ml 30.68 ml/m  AORTIC VALVE  AV Area (Vmax):  1.74 cm  AV Area (Vmean):  1.92 cm  AV Area (VTI):   1.92 cm  AV Vmax:      253.00 cm/s  AV Vmean:     177.000 cm/s  AV VTI:      0.528 m  AV Peak Grad:   25.6 mmHg  AV Mean Grad:   14.2 mmHg  LVOT Vmax:     127.00 cm/s  LVOT Vmean:    97.900 cm/s  LVOT VTI:     0.293 m  LVOT/AV VTI ratio: 0.55    AORTA  Ao Root diam: 4.00 cm   MITRAL VALVE        TRICUSPID VALVE  MV Area (PHT): 3.30 cm   TR Peak grad:  30.0 mmHg  MV Decel Time: 230 msec   TR Vmax:    274.00 cm/s  MV E velocity: 109.00 cm/s  MV A velocity: 121.00 cm/s SHUNTS  MV E/A ratio: 0.90     Systemic VTI: 0.29 m               Systemic Diam: 2.10 cm   Antimicrobials:  Anti-infectives (From admission, onward)   Start     Dose/Rate Route Frequency Ordered Stop   12/14/19 2200  acyclovir (ZOVIRAX) 200 MG capsule 400 mg        400 mg Oral 2 times daily 12/14/19 2155          Subjective: Seen and examined  at bedside and  he was still a little sleepy and woken from sleep this morning.  He had an unwitnessed fall and states that he did not hit his head but CT scan showed that he was scalp contusion.  No nausea or vomiting.  Denies any other concerns or complaints at this time.  Feels okay but still has not had orthostatic vital signs.  No other concerns or complaints at this time.  Objective: Vitals:   12/16/19 1046 12/16/19 1048 12/16/19 1158 12/16/19 1532  BP: (!) 174/82 (!) 141/103 (!) 176/90 (!) 167/79  Pulse: 64 71 (!) 59 (!) 58  Resp: 20 20 18 18   Temp: 98 F (36.7 C) 98 F (36.7 C) 98.5 F (36.9 C) 98 F (36.7 C)  TempSrc: Oral Oral Oral Oral  SpO2: 97% 94% 96% 99%  Weight:      Height:        Intake/Output Summary (Last 24 hours) at 12/16/2019 1831 Last data filed at 12/16/2019 1200 Gross per 24 hour  Intake --  Output 275 ml  Net -275 ml   Filed Weights   12/14/19 1240 12/15/19 0231  Weight: 133 kg 127.7 kg   Examination: Physical Exam:  Constitutional: WN/WD Caucasian male who is still slightly somnolent this morning and drowsy and awoken from his sleep.  Appears calm but comfortable. ENMT: External Ears, Nose appear normal. Grossly normal hearing.  Neck: Appears normal, supple, no cervical masses, normal ROM, no appreciable thyromegaly: No JVD Respiratory: Diminished to auscultation bilaterally, no wheezing, rales, rhonchi or crackles. Normal respiratory effort and patient is not tachypenic. No accessory muscle use.  Cardiovascular: RRR, no murmurs / rubs / gallops. S1 and S2 auscultated.  No appreciable extremity edema Abdomen: Soft, non-tender, distended secondary body habitus. No masses palpated. No appreciable hepatosplenomegaly. Bowel sounds positive.  GU: Deferred. Musculoskeletal: No clubbing / cyanosis of digits/nails. No joint deformity upper and lower extremities but he does have some pain on the lateral aspect of his right leg. Skin: No rashes, lesions, ulcers  on a limited skin evaluation . No induration; Warm and dry.  Neurologic: CN 2-12 grossly intact with no focal deficits. Romberg sign and cerebellar reflexes not assessed.  Psychiatric: Normal judgment and insight. Drowsy and mildly somnolent. Normal mood and appropriate affect.   Data Reviewed: I have personally reviewed following labs and imaging studies  CBC: Recent Labs  Lab 12/14/19 1249 12/15/19 0503 12/16/19 0511  WBC 5.6 7.1 6.6  NEUTROABS 3.6  --  3.6  HGB 15.8 14.8 14.3  HCT 44.7 41.9 40.4  MCV 88.9 88.2 89.6  PLT 200 182 559   Basic Metabolic Panel: Recent Labs  Lab 12/14/19 1250 12/15/19 0503 12/16/19 0511  NA 133* 137 138  K 3.9 3.6 3.7  CL 97* 100 101  CO2 28 29 29   GLUCOSE 201* 139* 138*  BUN 13 15 18   CREATININE 0.89 1.02 1.09  CALCIUM 10.3 10.3 10.3  MG  --  1.9 2.0  PHOS  --  3.3 3.4   GFR: Estimated Creatinine Clearance: 75.9 mL/min (by C-G formula based on SCr of 1.09 mg/dL). Liver Function Tests: Recent Labs  Lab 12/14/19 1250 12/15/19 0503 12/16/19 0511  AST 20 20 14*  ALT 20 18 17   ALKPHOS 46 40 46  BILITOT 1.2 1.3* 0.9  PROT 7.0 6.2* 6.1*  ALBUMIN 3.5 3.1* 3.1*   No results for input(s): LIPASE, AMYLASE in the last 168 hours. No results for input(s): AMMONIA in the last 168 hours. Coagulation Profile: Recent  Labs  Lab 12/14/19 1249  INR 1.1   Cardiac Enzymes: No results for input(s): CKTOTAL, CKMB, CKMBINDEX, TROPONINI in the last 168 hours. BNP (last 3 results) No results for input(s): PROBNP in the last 8760 hours. HbA1C: Recent Labs    12/15/19 0503  HGBA1C 7.5*   CBG: Recent Labs  Lab 12/15/19 1712 12/15/19 2146 12/16/19 0751 12/16/19 1155 12/16/19 1655  GLUCAP 128* 262* 147* 213* 141*   Lipid Profile: Recent Labs    12/15/19 0503  CHOL 135  HDL 30*  LDLCALC 60  TRIG 226*  CHOLHDL 4.5   Thyroid Function Tests: No results for input(s): TSH, T4TOTAL, FREET4, T3FREE, THYROIDAB in the last 72  hours. Anemia Panel: No results for input(s): VITAMINB12, FOLATE, FERRITIN, TIBC, IRON, RETICCTPCT in the last 72 hours. Sepsis Labs: Recent Labs  Lab 12/14/19 1250  LATICACIDVEN 1.2    Recent Results (from the past 240 hour(s))  Respiratory Panel by RT PCR (Flu A&B, Covid) - Nasopharyngeal Swab     Status: None   Collection Time: 12/14/19  1:26 PM   Specimen: Nasopharyngeal Swab  Result Value Ref Range Status   SARS Coronavirus 2 by RT PCR NEGATIVE NEGATIVE Final    Comment: (NOTE) SARS-CoV-2 target nucleic acids are NOT DETECTED.  The SARS-CoV-2 RNA is generally detectable in upper respiratoy specimens during the acute phase of infection. The lowest concentration of SARS-CoV-2 viral copies this assay can detect is 131 copies/mL. A negative result does not preclude SARS-Cov-2 infection and should not be used as the sole basis for treatment or other patient management decisions. A negative result may occur with  improper specimen collection/handling, submission of specimen other than nasopharyngeal swab, presence of viral mutation(s) within the areas targeted by this assay, and inadequate number of viral copies (<131 copies/mL). A negative result must be combined with clinical observations, patient history, and epidemiological information. The expected result is Negative.  Fact Sheet for Patients:  PinkCheek.be  Fact Sheet for Healthcare Providers:  GravelBags.it  This test is no t yet approved or cleared by the [place] FDA and  has been authorized for detection and/or diagnosis of SARS-CoV-2 by FDA under an Emergency Use Authorization (EUA). This EUA will remain  in effect (meaning this test can be used) for the duration of the COVID-19 declaration under Section 564(b)(1) of the Act, 21 U.S.C. section 360bbb-3(b)(1), unless the authorization is terminated or revoked sooner.     Influenza A by PCR NEGATIVE  NEGATIVE Final   Influenza B by PCR NEGATIVE NEGATIVE Final    Comment: (NOTE) The Xpert Xpress SARS-CoV-2/FLU/RSV assay is intended as an aid in  the diagnosis of influenza from Nasopharyngeal swab specimens and  should not be used as a sole basis for treatment. Nasal washings and  aspirates are unacceptable for Xpert Xpress SARS-CoV-2/FLU/RSV  testing.  Fact Sheet for Patients: PinkCheek.be  Fact Sheet for Healthcare Providers: GravelBags.it  This test is not yet approved or cleared by the [place] FDA and  has been authorized for detection and/or diagnosis of SARS-CoV-2 by  FDA under an Emergency Use Authorization (EUA). This EUA will remain  in effect (meaning this test can be used) for the duration of the  Covid-19 declaration under Section 564(b)(1) of the Act, 21  U.S.C. section 360bbb-3(b)(1), unless the authorization is  terminated or revoked. Performed at [facility], [address] [place], [place] [address]      RN Pressure Injury Documentation:     Estimated body  mass index is 38.18 kg/m as calculated from the following:   Height as of this encounter: 6' (1.829 m).   Weight as of this encounter: 127.7 kg.  Malnutrition Type:      Malnutrition Characteristics:      Nutrition Interventions:    Radiology Studies: EEG  Result Date: 12/15/2019 Lora Havens, MD     12/15/2019  5:25 PM Patient Name: Justin Griffith MRN: [id number] Epilepsy Attending: Lora Havens Referring Physician/Provider: Dr Hilton Sinclair Date: 12/15/2019 Duration: 20.39 mins Patient history: 79 y.o. male medical history significant ofhypertension, hyperlipidemia, diabetes mellitus, COPD, asthma, OSA, former smoker, BPH, colon cancer, PAD, who presents with altered mental status, fall, and shaking. EEG to evaluate for seizure. Level of alertness: Awake,asleep AEDs during EEG study: Gabapentin Technical  aspects: This EEG study was done with scalp electrodes positioned according to the 10-20 International system of electrode placement. Electrical activity was acquired at a sampling rate of 500Hz  and reviewed with a high frequency filter of 70Hz  and a low frequency filter of 1Hz . EEG data were recorded continuously and digitally stored. Description: The posterior dominant rhythm consists of 8 Hz activity of moderate voltage (25-35 uV) seen predominantly in posterior head regions, symmetric and reactive to eye opening and eye closing. Sleep was characterized by vertex waves, sleep spindles (12 to 14 Hz), maximal frontocentral region.   Physiologic photic driving was not seen during photic stimulation.  Hyperventilation was not performed.   IMPRESSION: This study is within normal limits. No seizures or epileptiform discharges were seen throughout the recording. Lora Havens   CT HEAD WO CONTRAST  Result Date: 12/16/2019 CLINICAL DATA:  Unwitnessed fall. EXAM: CT HEAD WITHOUT CONTRAST TECHNIQUE: Contiguous axial images were obtained from the base of the skull through the vertex without intravenous contrast. COMPARISON:  MRI and CT 12/14/2019 FINDINGS: Brain: No evidence of acute large vascular territory infarct, acute hemorrhage, hydrocephalus, extra-axial collection or mass lesion. No abnormal mass effect. Tiny punctate infarcts seen on prior MRI are not apparent by CT. Similar patchy white matter hypoattenuation, compatible with the sequela of chronic microvascular ischemic disease. Vascular: Calcific intracranial atherosclerosis. Skull: Small posterior right scalp contusion negative for fracture or focal lesion. Sinuses/Orbits: Negative. Other: Partially absent right-sided ossicles, better characterized on prior CT temporal bone. Under pneumatization of bilateral mastoid air cells with bilateral chronic opacification. IMPRESSION: 1. No evidence of acute intracranial abnormality. 2. Small right posterior scalp  contusion without underlying fracture. Electronically Signed   By: Margaretha Sheffield MD   On: 12/16/2019 13:10   MR BRAIN WO CONTRAST  Result Date: 12/14/2019 CLINICAL DATA:  Initial evaluation for acute seizure. EXAM: MRI HEAD WITHOUT CONTRAST TECHNIQUE: Multiplanar, multiecho pulse sequences of the brain and surrounding structures were obtained without intravenous contrast. COMPARISON:  Prior CT from earlier the same day as well as previous MRI from 04/21/2019. FINDINGS: Brain: Generalized age-related cerebral atrophy. Patchy and confluent T2/FLAIR hyperintensity within the periventricular deep white matter both cerebral hemispheres, nonspecific, but most commonly related to chronic microvascular ischemic disease. Overall, appearance is mildly progressed from previous. Punctate 3 mm focus of restricted diffusion involving the cortical gray matter of the right occipital lobe consistent with a small acute ischemic infarct (series 5, image 26). Additional tiny 3 mm focus of diffusion abnormality at the posterior left frontal cortex also suspicious for a tiny acute to early subacute ischemic infarct (series 5, image 38). No associated hemorrhage or mass effect about these lesions. No other foci of restricted  diffusion to suggest acute or subacute ischemia. Gray-white matter differentiation otherwise maintained. No other areas of encephalomalacia to suggest chronic cortical infarction. No acute intracranial hemorrhage. Single punctate focus of susceptibility artifact noted within the right temporal lobe, consistent with a small chronic microhemorrhage, likely small vessel related (series 3, image 23). No other areas of chronic hemorrhage identified. No mass lesion, midline shift or mass effect. No hydrocephalus or extra-axial fluid collection. No intrinsic temporal lobe abnormality. Pituitary gland suprasellar region within normal limits. Midline structures intact. Vascular: Major intracranial vascular flow voids  are maintained. Skull and upper cervical spine: Craniocervical junction within normal limits. Bone marrow signal intensity normal. No scalp soft tissue abnormality. Sinuses/Orbits: Patient status post bilateral ocular lens replacement. Globes and orbital soft tissues demonstrate no acute finding. Paranasal sinuses are largely clear. Moderate bilateral mastoid effusions. Visualized nasopharynx within normal limits. Other: None. IMPRESSION: 1. 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 2. Additional tiny 3 mm focus of diffusion abnormality involving the posterior left frontal cortex, suspicious for a tiny acute to early subacute ischemic infarct. 3. No other acute intracranial abnormality. 4. Age-related cerebral atrophy with chronic microvascular ischemic disease, mildly progressed from previous. Electronically Signed   By: Jeannine Boga M.D.   On: 12/14/2019 21:44   US Carotid Bilateral  Result Date: 12/15/2019 CLINICAL DATA:  Hypertension, stroke symptoms EXAM: BILATERAL CAROTID DUPLEX ULTRASOUND TECHNIQUE: Pearline Cables scale imaging, color Doppler and duplex ultrasound were performed of bilateral carotid and vertebral arteries in the neck. COMPARISON:  None. FINDINGS: Criteria: Quantification of carotid stenosis is based on velocity parameters that correlate the residual internal carotid diameter with NASCET-based stenosis levels, using the diameter of the distal internal carotid lumen as the denominator for stenosis measurement. The following velocity measurements were obtained: RIGHT ICA: 77/16 cm/sec CCA: 16/1 cm/sec SYSTOLIC ICA/CCA RATIO:  1.2 ECA: 224 cm/sec LEFT ICA: 75/10 cm/sec CCA: 09/60 cm/sec SYSTOLIC ICA/CCA RATIO:  1.0 ECA: 161 cm/sec RIGHT CAROTID ARTERY: Minor echogenic shadowing plaque formation. No hemodynamically significant right ICA stenosis, velocity elevation, or turbulent flow. Degree of narrowing less than 50%. RIGHT VERTEBRAL  ARTERY:  Normal antegrade flow LEFT CAROTID ARTERY: Similar scattered minor echogenic plaque formation. No hemodynamically significant left ICA stenosis, velocity elevation, or turbulent flow. LEFT VERTEBRAL ARTERY:  Normal antegrade flow IMPRESSION: Minor carotid atherosclerosis. No hemodynamically significant ICA stenosis. Degree of narrowing less than 50% bilaterally by ultrasound criteria. Patent antegrade vertebral flow bilaterally Electronically Signed   By: Jerilynn Mages.  Shick M.D.   On: 12/15/2019 09:20   US Venous Img Lower Bilateral (DVT)  Result Date: 12/15/2019 CLINICAL DATA:  79 year old male with history of leg pain for 1 month. EXAM: BILATERAL LOWER EXTREMITY VENOUS DOPPLER ULTRASOUND TECHNIQUE: Gray-scale sonography with graded compression, as well as color Doppler and duplex ultrasound were performed to evaluate the lower extremity deep venous systems from the level of the common femoral vein and including the common femoral, femoral, profunda femoral, popliteal and calf veins including the posterior tibial, peroneal and gastrocnemius veins when visible. The superficial great saphenous vein was also interrogated. Spectral Doppler was utilized to evaluate flow at rest and with distal augmentation maneuvers in the common femoral, femoral and popliteal veins. COMPARISON:  None. FINDINGS: RIGHT LOWER EXTREMITY Common Femoral Vein: No evidence of thrombus. Normal compressibility, respiratory phasicity and response to augmentation. Saphenofemoral Junction: No evidence of thrombus. Normal compressibility and flow on color Doppler imaging. Profunda Femoral Vein: No evidence of thrombus. Normal compressibility and flow  on color Doppler imaging. Femoral Vein: No evidence of thrombus. Normal compressibility, respiratory phasicity and response to augmentation. Popliteal Vein: No evidence of thrombus. Normal compressibility, respiratory phasicity and response to augmentation. Calf Veins: No evidence of thrombus.  Normal compressibility and flow on color Doppler imaging. Superficial Great Saphenous Vein: No evidence of thrombus. Normal compressibility. Other Findings:  None. LEFT LOWER EXTREMITY Common Femoral Vein: No evidence of thrombus. Normal compressibility, respiratory phasicity and response to augmentation. Saphenofemoral Junction: No evidence of thrombus. Normal compressibility and flow on color Doppler imaging. Profunda Femoral Vein: No evidence of thrombus. Normal compressibility and flow on color Doppler imaging. Femoral Vein: No evidence of thrombus. Normal compressibility, respiratory phasicity and response to augmentation. Popliteal Vein: No evidence of thrombus. Normal compressibility, respiratory phasicity and response to augmentation. Calf Veins: No evidence of thrombus. Normal compressibility and flow on color Doppler imaging. Superficial Great Saphenous Vein: No evidence of thrombus. Normal compressibility. Other Findings:  None. IMPRESSION: No evidence of bilateral lower extremity deep venous thrombosis. Electronically Signed   By: Ruthann Cancer MD   On: 12/15/2019 16:23   ECHOCARDIOGRAM COMPLETE  Result Date: 12/16/2019    ECHOCARDIOGRAM REPORT   Patient Name:   MAYFORD ALBERG [facility] Date of Exam: 12/16/2019 Medical Rec #:  [id number]      Height:       72.0 in Accession #:    [id number]     Weight:       281.5 lb Date of Birth:  [birth year]      BSA:          2.464 m Patient Age:    27 years       BP:           137/70 mmHg Patient Gender: M              HR:           54 bpm. Exam Location:  [facility] Procedure: 2D Echo Indications:     STROKE 434.91/I63.9  History:         Patient has no prior history of Echocardiogram examinations.                  COPD, Signs/Symptoms:Syncope; Risk Factors:Diabetes,                  Hypertension and Dyslipidemia.  Sonographer:     Avanell Shackleton Referring Phys:  [id number] Georgina Quint LATIF [facility] Diagnosing Phys: Dorris Carnes MD IMPRESSIONS  1. Left ventricular ejection fraction, by  estimation, is 60 to 65%. The left ventricle has normal function. The left ventricle has no regional wall motion abnormalities. There is moderate left ventricular hypertrophy. Left ventricular diastolic parameters are indeterminate.  2. Right ventricular systolic function is normal. The right ventricular size is normal.  3. The mitral valve is normal in structure. Trivial mitral valve regurgitation.  4. AV is thickened, calcified with mildly restricted motion. Peak and mean gradients through the AV are 26 and 14 mm Hg consistent with mild AS. Marland Kitchen The aortic valve is abnormal. Aortic valve regurgitation is not visualized. Mild aortic valve stenosis.  5. The inferior vena cava is dilated in size with >50% respiratory variability, suggesting right atrial pressure of 8 mmHg. FINDINGS  Left Ventricle: Left ventricular ejection fraction, by estimation, is 60 to 65%. The left ventricle has normal function. The left ventricle has no regional wall motion abnormalities. The left ventricular internal cavity size was normal in size. There is  moderate left ventricular hypertrophy.  Left ventricular diastolic parameters are indeterminate. Right Ventricle: The right ventricular size is normal. No increase in right ventricular wall thickness. Right ventricular systolic function is normal. Left Atrium: Left atrial size was normal in size. Right Atrium: Right atrial size was normal in size. Pericardium: There is no evidence of pericardial effusion. Mitral Valve: The mitral valve is normal in structure. Trivial mitral valve regurgitation. Tricuspid Valve: The tricuspid valve is normal in structure. Tricuspid valve regurgitation is trivial. Aortic Valve: AV is thickened, calcified with mildly restricted motion. Peak and mean gradients through the AV are 26 and 14 mm Hg consistent with mild AS. The aortic valve is abnormal. Aortic valve regurgitation is not visualized. Mild aortic stenosis is present. Aortic valve mean gradient measures  14.2 mmHg. Aortic valve peak gradient measures 25.6 mmHg. Aortic valve area, by VTI measures 1.92 cm. Pulmonic Valve: The pulmonic valve was not well visualized. Pulmonic valve regurgitation is not visualized. Aorta: The aortic root is normal in size and structure. Venous: The inferior vena cava is dilated in size with greater than 50% respiratory variability, suggesting right atrial pressure of 8 mmHg. IAS/Shunts: No atrial level shunt detected by color flow Doppler.  LEFT VENTRICLE PLAX 2D LVIDd:         4.67 cm  Diastology LVIDs:         3.13 cm  LV e' medial:    4.57 cm/s LV PW:         1.51 cm  LV E/e' medial:  23.9 LV IVS:        1.56 cm  LV e' lateral:   5.77 cm/s LVOT diam:     2.10 cm  LV E/e' lateral: 18.9 LV SV:         101 LV SV Index:   41 LVOT Area:     3.46 cm  RIGHT VENTRICLE             IVC RV S prime:     14.00 cm/s  IVC diam: 2.32 cm TAPSE (M-mode): 2.8 cm LEFT ATRIUM           Index       RIGHT ATRIUM           Index LA diam:      4.60 cm 1.87 cm/m  RA Area:     23.90 cm LA Vol (A4C): 76.9 ml 31.21 ml/m RA Volume:   75.60 ml  30.68 ml/m  AORTIC VALVE AV Area (Vmax):    1.74 cm AV Area (Vmean):   1.92 cm AV Area (VTI):     1.92 cm AV Vmax:           253.00 cm/s AV Vmean:          177.000 cm/s AV VTI:            0.528 m AV Peak Grad:      25.6 mmHg AV Mean Grad:      14.2 mmHg LVOT Vmax:         127.00 cm/s LVOT Vmean:        97.900 cm/s LVOT VTI:          0.293 m LVOT/AV VTI ratio: 0.55  AORTA Ao Root diam: 4.00 cm MITRAL VALVE                TRICUSPID VALVE MV Area (PHT): 3.30 cm     TR Peak grad:   30.0 mmHg MV Decel Time: 230 msec     TR Vmax:  274.00 cm/s MV E velocity: 109.00 cm/s MV A velocity: 121.00 cm/s  SHUNTS MV E/A ratio:  0.90         Systemic VTI:  0.29 m                             Systemic Diam: 2.10 cm Dorris Carnes MD Electronically signed by Dorris Carnes MD Signature Date/Time: 12/16/2019/1:41:34 PM    Final    Scheduled Meds:  acyclovir  400 mg Oral BID    aspirin  300 mg Rectal Daily   Or   aspirin EC  325 mg Oral Daily   azelastine  2 spray Each Nare BID   budesonide  0.25 mg Inhalation BID   carvedilol  25 mg Oral BID WC   cholecalciferol  50 mcg Oral Daily   enoxaparin (LOVENOX) injection  40 mg Subcutaneous Q24H   gabapentin  300 mg Oral QHS   insulin aspart  0-5 Units Subcutaneous QHS   insulin aspart  0-9 Units Subcutaneous TID WC   insulin glargine  50 Units Subcutaneous Daily   ipratropium-albuterol  3 mL Nebulization BID   irbesartan  300 mg Oral Daily   mupirocin ointment  1 application Topical BID   primidone  50 mg Oral BID   rosuvastatin  20 mg Oral QHS   tamsulosin  0.4 mg Oral Daily   vitamin B-12  1,000 mcg Oral Daily   Continuous Infusions:   LOS: 1 day   Kerney Elbe, DO [facility] PAGER is on AMION  If 7PM-7AM, please contact night-coverage [email or web address]

## 2019-12-16 NOTE — Progress Notes (Signed)
PT Cancellation Note  Patient Details Name: BURNICE OESTREICHER MRN: [id number] DOB: [birth year]   Cancelled Treatment:    Reason Eval/Treat Not Completed: Patient at procedure or test/unavailable   Alanson Puls, PT DPT 12/16/2019, 10:17 AM

## 2019-12-16 NOTE — Progress Notes (Signed)
Patient found on floor beside bed sitting down. No injuries or pain noted. Patient found on floor by [facility] NT. Patient assisted back to chair from floor with gait belt and walker by [place] NT, Nate PT, [employer], and I Estill Bamberg RN). Patient states he did not hit his head, has no pain, and no complaints. Patient states he was lying in bed and sat up to sit on the side of the bed to eat breakfast, while sitting up he slid out of bed. Bed alarm went off, patient had non skid socks on and call bell in reach. Bed was lowest position. Currently Patient stable, VSS, CT head ordered by MD. Low bed ordered. Patient in chair, alarm on, call bell in reach, and educated to call before he gets up.

## 2019-12-17 DIAGNOSIS — I639 Cerebral infarction, unspecified: Secondary | ICD-10-CM

## 2019-12-17 LAB — COMPREHENSIVE METABOLIC PANEL
ALT: 18 U/L (ref 0–44)
AST: 13 U/L — ABNORMAL LOW (ref 15–41)
Albumin: 3.3 g/dL — ABNORMAL LOW (ref 3.5–5.0)
Alkaline Phosphatase: 44 U/L (ref 38–126)
Anion gap: 8 (ref 5–15)
BUN: 19 mg/dL (ref 8–23)
CO2: 28 mmol/L (ref 22–32)
Calcium: 10.3 mg/dL (ref 8.9–10.3)
Chloride: 100 mmol/L (ref 98–111)
Creatinine, Ser: 0.92 mg/dL (ref 0.61–1.24)
GFR calc Af Amer: >60 mL/min (ref >60)
GFR calc non Af Amer: >60 mL/min (ref >60)
Glucose, Bld: 147 mg/dL — ABNORMAL HIGH (ref 70–99)
Potassium: 3.7 mmol/L (ref 3.5–5.1)
Sodium: 136 mmol/L (ref 135–145)
Total Bilirubin: 0.9 mg/dL (ref 0.3–1.2)
Total Protein: 6.6 g/dL (ref 6.5–8.1)

## 2019-12-17 LAB — CBC WITH DIFFERENTIAL/PLATELET
Abs Immature Granulocytes: 0.03 10*3/uL (ref 0.00–0.07)
Basophils Absolute: 0.1 10*3/uL (ref 0.0–0.1)
Basophils Relative: 1 %
Eosinophils Absolute: 0.2 10*3/uL (ref 0.0–0.5)
Eosinophils Relative: 3 %
HCT: 43.3 % (ref 39.0–52.0)
Hemoglobin: 14.8 g/dL (ref 13.0–17.0)
Immature Granulocytes: 1 %
Lymphocytes Relative: 32 %
Lymphs Abs: 2.1 10*3/uL (ref 0.7–4.0)
MCH: 31.3 pg (ref 26.0–34.0)
MCHC: 34.2 g/dL (ref 30.0–36.0)
MCV: 91.5 fL (ref 80.0–100.0)
Monocytes Absolute: 0.6 10*3/uL (ref 0.1–1.0)
Monocytes Relative: 10 %
Neutro Abs: 3.4 10*3/uL (ref 1.7–7.7)
Neutrophils Relative %: 53 %
Platelets: 197 10*3/uL (ref 150–400)
RBC: 4.73 MIL/uL (ref 4.22–5.81)
RDW: 12.7 % (ref 11.5–15.5)
WBC: 6.4 10*3/uL (ref 4.0–10.5)
nRBC: 0 % (ref 0.0–0.2)

## 2019-12-17 LAB — GLUCOSE, CAPILLARY
Glucose-Capillary: 171 mg/dL — ABNORMAL HIGH (ref 70–99)
Glucose-Capillary: 197 mg/dL — ABNORMAL HIGH (ref 70–99)
Glucose-Capillary: 148 mg/dL — ABNORMAL HIGH (ref 70–99)

## 2019-12-17 LAB — PHOSPHORUS: Phosphorus: 3 mg/dL (ref 2.5–4.6)

## 2019-12-17 LAB — MAGNESIUM: Magnesium: 2 mg/dL (ref 1.7–2.4)

## 2019-12-17 MED ORDER — ASPIRIN 325 MG PO TBEC
325.0000 mg | DELAYED_RELEASE_TABLET | Freq: Every day | ORAL | 0 refills | Status: DC
Start: 2019-12-18 — End: 2020-05-21

## 2019-12-17 MED ORDER — SODIUM CHLORIDE 0.9 % IV SOLN: INTRAVENOUS | Status: DC

## 2019-12-17 MED ORDER — HYDRALAZINE HCL 20 MG/ML IJ SOLN
10.0000 mg | Freq: Once | INTRAMUSCULAR | Status: AC
  Administered 2019-12-17: 10 mg via INTRAVENOUS
  Filled 2019-12-17: qty 1

## 2019-12-17 MED ORDER — FUROSEMIDE 20 MG PO TABS
20.0000 mg | ORAL_TABLET | Freq: Every day | ORAL | 0 refills | Status: DC
Start: 2019-12-17 — End: 2020-04-17

## 2019-12-17 MED ORDER — OLMESARTAN MEDOXOMIL 40 MG PO TABS: 40.0000 mg | ORAL_TABLET | Freq: Every day | ORAL | 11 refills | Status: DC

## 2019-12-17 MED ORDER — DEXAMETHASONE SODIUM PHOSPHATE 10 MG/ML IJ SOLN
8.0000 mg | Freq: Once | INTRAMUSCULAR | Status: AC
  Administered 2019-12-17: 8 mg via INTRAVENOUS
  Filled 2019-12-17: qty 0.8

## 2019-12-17 MED ORDER — INSULIN GLARGINE 100 UNIT/ML ~~LOC~~ SOLN: 50.0000 [IU] | Freq: Every day | SUBCUTANEOUS | 11 refills | Status: DC

## 2019-12-17 MED ORDER — MAGNESIUM SULFATE 2 GM/50ML IV SOLN
2.0000 g | Freq: Once | INTRAVENOUS | Status: AC
  Administered 2019-12-17: 2 g via INTRAVENOUS
  Filled 2019-12-17: qty 50

## 2019-12-17 MED ORDER — CARVEDILOL 12.5 MG PO TABS: 25.0000 mg | ORAL_TABLET | Freq: Two times a day (BID) | ORAL | 0 refills | Status: DC

## 2019-12-17 MED ORDER — SODIUM CHLORIDE 0.9 % IV BOLUS
500.0000 mL | Freq: Once | INTRAVENOUS | Status: AC
  Administered 2019-12-17: 500 mL via INTRAVENOUS

## 2019-12-17 MED ORDER — KETOROLAC TROMETHAMINE 30 MG/ML IJ SOLN
30.0000 mg | Freq: Once | INTRAMUSCULAR | Status: AC
  Administered 2019-12-17: 30 mg via INTRAVENOUS
  Filled 2019-12-17: qty 1

## 2019-12-17 MED ORDER — BUTALBITAL-APAP-CAFFEINE 50-325-40 MG PO TABS
1.0000 | ORAL_TABLET | Freq: Four times a day (QID) | ORAL | Status: DC | PRN
  Administered 2019-12-17: 1 via ORAL
  Filled 2019-12-17 (×2): qty 1

## 2019-12-17 NOTE — Plan of Care (Signed)

## 2019-12-17 NOTE — Progress Notes (Signed)
Subjective: Much more awake. Complaining of frontal headache   Past Medical History:  Diagnosis Date  . Arthritis   . Asthma 2000  . Back pain   . Colon polyp 2012  . COPD (chronic obstructive pulmonary disease) ([place])   . Diabetes mellitus without complication ([place])   . Emphysema of lung ([place])   . Heart murmur   . Hernia 2000  . Hyperlipidemia   . Hypertension   . Personal history of malignant neoplasm of large intestine   . Personal history of tobacco use, presenting hazards to health   . Sleep apnea   . Special screening for malignant neoplasms, colon     Past Surgical History:  Procedure Laterality Date  . CARDIAC CATHETERIZATION Left 09/25/2015   Procedure: Left Heart Cath and Coronary Angiography;  Surgeon: Yolonda Kida, MD;  Location: [place] [facility];  Service: Cardiovascular;  Laterality: Left;  . CHOLECYSTECTOMY  1970  . COLON SURGERY  07-16-99   sigmoid colon resection with primary anastomosis, chemotherapy for metastatic disease  . COLONOSCOPY  2001, 2012   Dr Bary Castilla, tubular adenoma of the cecum and ascending colon in 2012.  Marland Kitchen COLONOSCOPY WITH PROPOFOL N/A 02/12/2016   Procedure: COLONOSCOPY WITH PROPOFOL;  Surgeon: Robert Bellow, MD;  Location: [name];  Service: Endoscopy;  Laterality: N/A;  . ESOPHAGOGASTRODUODENOSCOPY (EGD) WITH PROPOFOL N/A 02/12/2016   Procedure: ESOPHAGOGASTRODUODENOSCOPY (EGD) WITH PROPOFOL;  Surgeon: Robert Bellow, MD;  Location: [facility] ENDOSCOPY;  Service: Endoscopy;  Laterality: N/A;  . HERNIA REPAIR Right    right inguinal hernia repair  . JOINT REPLACEMENT Bilateral    knee replacement  . KNEE SURGERY Bilateral 2010  . MYRINGOTOMY WITH TUBE PLACEMENT Bilateral 08/16/2014   Procedure: MYRINGOTOMY WITH TUBE PLACEMENT;  Surgeon: Carloyn Manner, MD;  Location: [facility] ORS;  Service: ENT;  Laterality: Bilateral;  . PERIPHERAL VASCULAR CATHETERIZATION Right 09/04/2015   Procedure: Lower Extremity Angiography;  Surgeon:  Katha Cabal, MD;  Location: [place] [facility];  Service: Cardiovascular;  Laterality: Right;  . PERIPHERAL VASCULAR CATHETERIZATION  09/04/2015   Procedure: Lower Extremity Intervention;  Surgeon: Katha Cabal, MD;  Location: [place] [facility];  Service: Cardiovascular;;  . TONSILLECTOMY    . TYMPANOSTOMY TUBE PLACEMENT      Family History  Problem Relation Age of Onset  . Diabetes Father   . Esophageal cancer Mother   . Alzheimer's disease Paternal Uncle     Social History:  reports that he quit smoking about 30 years ago. His smoking use included cigarettes. He has a 30.00 pack-year smoking history. He has never used smokeless tobacco. He reports that he does not drink alcohol and does not use drugs.  Allergies  Allergen Reactions  . Farxiga [Dapagliflozin] Rash  . Jardiance [Empagliflozin] Rash    Medications: I have reviewed the patient's current medications.  ROS: Unable to obtain due to hard of hearing   Physical Examination: Blood pressure (!) 171/85, pulse (!) 59, temperature 98.3 F (36.8 C), temperature source Oral, resp. rate 18, height 6' (1.829 m), weight 127.7 kg, SpO2 93 %.   Neurological Examination   Mental Status: Alert, oriented, thought content appropriate speech appears fluent . Cranial Nerves: II: visual fields grossly normal, pupils equal, round, reactive to light and accommodation III,IV, VI: ptosis not present, extra-ocular motions intact bilaterally V,VII: smile symmetric, facial light touch sensation normal bilaterally VIII: hearing normal bilaterally IX,X: gag reflex present XI: bilateral shoulder shrug XII: midline tongue extension Motor: Moves everything  symmetrically 5/5 Sensory: Pinprick and light touch intact throughout, bilaterally     Laboratory Studies:   Basic Metabolic Panel: Recent Labs  Lab 12/14/19 1250 12/14/19 1250 12/15/19 0503 12/16/19 0511 12/17/19 0258  NA 133*  --  137 138 136  K 3.9  --  3.6  3.7 3.7  CL 97*  --  100 101 100  CO2 28  --  29 29 28   GLUCOSE 201*  --  139* 138* 147*  BUN 13  --  15 18 19   CREATININE 0.89  --  1.02 1.09 0.92  CALCIUM 10.3   < > 10.3 10.3 10.3  MG  --   --  1.9 2.0 2.0  PHOS  --   --  3.3 3.4 3.0   < > = values in this interval not displayed.    Liver Function Tests: Recent Labs  Lab 12/14/19 1250 12/15/19 0503 12/16/19 0511 12/17/19 0258  AST 20 20 14* 13*  ALT 20 18 17 18   ALKPHOS 46 40 46 44  BILITOT 1.2 1.3* 0.9 0.9  PROT 7.0 6.2* 6.1* 6.6  ALBUMIN 3.5 3.1* 3.1* 3.3*   No results for input(s): LIPASE, AMYLASE in the last 168 hours. No results for input(s): AMMONIA in the last 168 hours.  CBC: Recent Labs  Lab 12/14/19 1249 12/15/19 0503 12/16/19 0511 12/17/19 0258  WBC 5.6 7.1 6.6 6.4  NEUTROABS 3.6  --  3.6 3.4  HGB 15.8 14.8 14.3 14.8  HCT 44.7 41.9 40.4 43.3  MCV 88.9 88.2 89.6 91.5  PLT 200 182 185 197    Cardiac Enzymes: No results for input(s): CKTOTAL, CKMB, CKMBINDEX, TROPONINI in the last 168 hours.  BNP: Invalid input(s): POCBNP  CBG: Recent Labs  Lab 12/16/19 1155 12/16/19 1655 12/16/19 2102 12/16/19 2130 12/17/19 0813  GLUCAP 213* 141* 177* 177* 171*    Microbiology: Results for orders placed or performed during the hospital encounter of 12/14/19  Respiratory Panel by RT PCR (Flu A&B, Covid) - Nasopharyngeal Swab     Status: None   Collection Time: 12/14/19  1:26 PM   Specimen: Nasopharyngeal Swab  Result Value Ref Range Status   SARS Coronavirus 2 by RT PCR NEGATIVE NEGATIVE Final    Comment: (NOTE) SARS-CoV-2 target nucleic acids are NOT DETECTED.  The SARS-CoV-2 RNA is generally detectable in upper respiratoy specimens during the acute phase of infection. The lowest concentration of SARS-CoV-2 viral copies this assay can detect is 131 copies/mL. A negative result does not preclude SARS-Cov-2 infection and should not be used as the sole basis for treatment or other patient management  decisions. A negative result may occur with  improper specimen collection/handling, submission of specimen other than nasopharyngeal swab, presence of viral mutation(s) within the areas targeted by this assay, and inadequate number of viral copies (<131 copies/mL). A negative result must be combined with clinical observations, patient history, and epidemiological information. The expected result is Negative.  Fact Sheet for Patients:  PinkCheek.be  Fact Sheet for Healthcare Providers:  GravelBags.it  This test is no t yet approved or cleared by the [place] FDA and  has been authorized for detection and/or diagnosis of SARS-CoV-2 by FDA under an Emergency Use Authorization (EUA). This EUA will remain  in effect (meaning this test can be used) for the duration of the COVID-19 declaration under Section 564(b)(1) of the Act, 21 U.S.C. section 360bbb-3(b)(1), unless the authorization is terminated or revoked sooner.     Influenza A by PCR NEGATIVE  NEGATIVE Final   Influenza B by PCR NEGATIVE NEGATIVE Final    Comment: (NOTE) The Xpert Xpress SARS-CoV-2/FLU/RSV assay is intended as an aid in  the diagnosis of influenza from Nasopharyngeal swab specimens and  should not be used as a sole basis for treatment. Nasal washings and  aspirates are unacceptable for Xpert Xpress SARS-CoV-2/FLU/RSV  testing.  Fact Sheet for Patients: PinkCheek.be  Fact Sheet for Healthcare Providers: GravelBags.it  This test is not yet approved or cleared by the [place] FDA and  has been authorized for detection and/or diagnosis of SARS-CoV-2 by  FDA under an Emergency Use Authorization (EUA). This EUA will remain  in effect (meaning this test can be used) for the duration of the  Covid-19 declaration under Section 564(b)(1) of the Act, 21  U.S.C. section 360bbb-3(b)(1), unless the  authorization is  terminated or revoked. Performed at [facility], [address] [place], [place] [address]     Coagulation Studies: Recent Labs    12/14/19 1249  LABPROT 13.3  INR 1.1    Urinalysis:  Recent Labs  Lab 12/14/19 2015  COLORURINE YELLOW*  LABSPEC 1.018  PHURINE 6.0  GLUCOSEU 50*  HGBUR NEGATIVE  BILIRUBINUR NEGATIVE  KETONESUR NEGATIVE  PROTEINUR >=300*  NITRITE NEGATIVE  LEUKOCYTESUR NEGATIVE    Lipid Panel:     Component Value Date/Time   CHOL 135 12/15/2019 0503   CHOL 134 11/28/2019 1002   CHOL 89 02/02/2017 0823   TRIG 226 (H) 12/15/2019 0503   TRIG 262 (H) 02/02/2017 0823   HDL 30 (L) 12/15/2019 0503   HDL 41 11/28/2019 1002   CHOLHDL 4.5 12/15/2019 0503   VLDL 45 (H) 12/15/2019 0503   VLDL 52 (H) 02/02/2017 0823   LDLCALC 60 12/15/2019 0503   LDLCALC 63 11/28/2019 1002    HgbA1C:  Lab Results  Component Value Date   HGBA1C 7.5 (H) 12/15/2019    Urine Drug Screen:      Component Value Date/Time   LABOPIA NONE DETECTED 12/14/2019 2015   COCAINSCRNUR NONE DETECTED 12/14/2019 2015   LABBENZ NONE DETECTED 12/14/2019 2015   AMPHETMU NONE DETECTED 12/14/2019 2015   THCU NONE DETECTED 12/14/2019 2015   LABBARB NONE DETECTED 12/14/2019 2015    Alcohol Level: No results for input(s): ETH in the last 168 hours.  Other results: EKG: normal EKG, normal sinus rhythm, unchanged from previous tracings.  Imaging: EEG  Result Date: 12/15/2019 Lora Havens, MD     12/15/2019  5:25 PM Patient Name: Justin Griffith MRN: [id number] Epilepsy Attending: Lora Havens Referring Physician/Provider: Dr Hilton Sinclair Date: 12/15/2019 Duration: 20.39 mins Patient history: 79 y.o. male medical history significant ofhypertension, hyperlipidemia, diabetes mellitus, COPD, asthma, OSA, former smoker, BPH, colon cancer, PAD, who presents with altered mental status, fall, and shaking. EEG to evaluate for seizure. Level of alertness:  Awake,asleep AEDs during EEG study: Gabapentin Technical aspects: This EEG study was done with scalp electrodes positioned according to the 10-20 International system of electrode placement. Electrical activity was acquired at a sampling rate of 500Hz  and reviewed with a high frequency filter of 70Hz  and a low frequency filter of 1Hz . EEG data were recorded continuously and digitally stored. Description: The posterior dominant rhythm consists of 8 Hz activity of moderate voltage (25-35 uV) seen predominantly in posterior head regions, symmetric and reactive to eye opening and eye closing. Sleep was characterized by vertex waves, sleep spindles (12 to 14 Hz), maximal frontocentral region.   Physiologic photic  driving was not seen during photic stimulation.  Hyperventilation was not performed.   IMPRESSION: This study is within normal limits. No seizures or epileptiform discharges were seen throughout the recording. Lora Havens   CT HEAD WO CONTRAST  Result Date: 12/16/2019 CLINICAL DATA:  Unwitnessed fall. EXAM: CT HEAD WITHOUT CONTRAST TECHNIQUE: Contiguous axial images were obtained from the base of the skull through the vertex without intravenous contrast. COMPARISON:  MRI and CT 12/14/2019 FINDINGS: Brain: No evidence of acute large vascular territory infarct, acute hemorrhage, hydrocephalus, extra-axial collection or mass lesion. No abnormal mass effect. Tiny punctate infarcts seen on prior MRI are not apparent by CT. Similar patchy white matter hypoattenuation, compatible with the sequela of chronic microvascular ischemic disease. Vascular: Calcific intracranial atherosclerosis. Skull: Small posterior right scalp contusion negative for fracture or focal lesion. Sinuses/Orbits: Negative. Other: Partially absent right-sided ossicles, better characterized on prior CT temporal bone. Under pneumatization of bilateral mastoid air cells with bilateral chronic opacification. IMPRESSION: 1. No evidence of acute  intracranial abnormality. 2. Small right posterior scalp contusion without underlying fracture. Electronically Signed   By: Margaretha Sheffield MD   On: 12/16/2019 13:10   US Venous Img Lower Bilateral (DVT)  Result Date: 12/15/2019 CLINICAL DATA:  79 year old male with history of leg pain for 1 month. EXAM: BILATERAL LOWER EXTREMITY VENOUS DOPPLER ULTRASOUND TECHNIQUE: Gray-scale sonography with graded compression, as well as color Doppler and duplex ultrasound were performed to evaluate the lower extremity deep venous systems from the level of the common femoral vein and including the common femoral, femoral, profunda femoral, popliteal and calf veins including the posterior tibial, peroneal and gastrocnemius veins when visible. The superficial great saphenous vein was also interrogated. Spectral Doppler was utilized to evaluate flow at rest and with distal augmentation maneuvers in the common femoral, femoral and popliteal veins. COMPARISON:  None. FINDINGS: RIGHT LOWER EXTREMITY Common Femoral Vein: No evidence of thrombus. Normal compressibility, respiratory phasicity and response to augmentation. Saphenofemoral Junction: No evidence of thrombus. Normal compressibility and flow on color Doppler imaging. Profunda Femoral Vein: No evidence of thrombus. Normal compressibility and flow on color Doppler imaging. Femoral Vein: No evidence of thrombus. Normal compressibility, respiratory phasicity and response to augmentation. Popliteal Vein: No evidence of thrombus. Normal compressibility, respiratory phasicity and response to augmentation. Calf Veins: No evidence of thrombus. Normal compressibility and flow on color Doppler imaging. Superficial Great Saphenous Vein: No evidence of thrombus. Normal compressibility. Other Findings:  None. LEFT LOWER EXTREMITY Common Femoral Vein: No evidence of thrombus. Normal compressibility, respiratory phasicity and response to augmentation. Saphenofemoral Junction: No evidence  of thrombus. Normal compressibility and flow on color Doppler imaging. Profunda Femoral Vein: No evidence of thrombus. Normal compressibility and flow on color Doppler imaging. Femoral Vein: No evidence of thrombus. Normal compressibility, respiratory phasicity and response to augmentation. Popliteal Vein: No evidence of thrombus. Normal compressibility, respiratory phasicity and response to augmentation. Calf Veins: No evidence of thrombus. Normal compressibility and flow on color Doppler imaging. Superficial Great Saphenous Vein: No evidence of thrombus. Normal compressibility. Other Findings:  None. IMPRESSION: No evidence of bilateral lower extremity deep venous thrombosis. Electronically Signed   By: Ruthann Cancer MD   On: 12/15/2019 16:23   ECHOCARDIOGRAM COMPLETE  Result Date: 12/16/2019    ECHOCARDIOGRAM REPORT   Patient Name:   Justin Griffith [facility] Date of Exam: 12/16/2019 Medical Rec #:  [id number]      Height:       72.0 in Accession #:  [id number]     Weight:       281.5 lb Date of Birth:  01-Feb-1941      BSA:          2.464 m Patient Age:    79 years       BP:           137/70 mmHg Patient Gender: M              HR:           54 bpm. Exam Location:  [facility] Procedure: 2D Echo Indications:     STROKE 434.91/I63.9  History:         Patient has no prior history of Echocardiogram examinations.                  COPD, Signs/Symptoms:Syncope; Risk Factors:Diabetes,                  Hypertension and Dyslipidemia.  Sonographer:     Avanell Shackleton Referring Phys:  [id number] Georgina Quint LATIF [facility] Diagnosing Phys: Dorris Carnes MD IMPRESSIONS  1. Left ventricular ejection fraction, by estimation, is 60 to 65%. The left ventricle has normal function. The left ventricle has no regional wall motion abnormalities. There is moderate left ventricular hypertrophy. Left ventricular diastolic parameters are indeterminate.  2. Right ventricular systolic function is normal. The right ventricular size is normal.  3. The mitral valve is  normal in structure. Trivial mitral valve regurgitation.  4. AV is thickened, calcified with mildly restricted motion. Peak and mean gradients through the AV are 26 and 14 mm Hg consistent with mild AS. Marland Kitchen The aortic valve is abnormal. Aortic valve regurgitation is not visualized. Mild aortic valve stenosis.  5. The inferior vena cava is dilated in size with >50% respiratory variability, suggesting right atrial pressure of 8 mmHg. FINDINGS  Left Ventricle: Left ventricular ejection fraction, by estimation, is 60 to 65%. The left ventricle has normal function. The left ventricle has no regional wall motion abnormalities. The left ventricular internal cavity size was normal in size. There is  moderate left ventricular hypertrophy. Left ventricular diastolic parameters are indeterminate. Right Ventricle: The right ventricular size is normal. No increase in right ventricular wall thickness. Right ventricular systolic function is normal. Left Atrium: Left atrial size was normal in size. Right Atrium: Right atrial size was normal in size. Pericardium: There is no evidence of pericardial effusion. Mitral Valve: The mitral valve is normal in structure. Trivial mitral valve regurgitation. Tricuspid Valve: The tricuspid valve is normal in structure. Tricuspid valve regurgitation is trivial. Aortic Valve: AV is thickened, calcified with mildly restricted motion. Peak and mean gradients through the AV are 26 and 14 mm Hg consistent with mild AS. The aortic valve is abnormal. Aortic valve regurgitation is not visualized. Mild aortic stenosis is present. Aortic valve mean gradient measures 14.2 mmHg. Aortic valve peak gradient measures 25.6 mmHg. Aortic valve area, by VTI measures 1.92 cm. Pulmonic Valve: The pulmonic valve was not well visualized. Pulmonic valve regurgitation is not visualized. Aorta: The aortic root is normal in size and structure. Venous: The inferior vena cava is dilated in size with greater than 50%  respiratory variability, suggesting right atrial pressure of 8 mmHg. IAS/Shunts: No atrial level shunt detected by color flow Doppler.  LEFT VENTRICLE PLAX 2D LVIDd:         4.67 cm  Diastology LVIDs:         3.13 cm  LV e' medial:  4.57 cm/s LV PW:         1.51 cm  LV E/e' medial:  23.9 LV IVS:        1.56 cm  LV e' lateral:   5.77 cm/s LVOT diam:     2.10 cm  LV E/e' lateral: 18.9 LV SV:         101 LV SV Index:   41 LVOT Area:     3.46 cm  RIGHT VENTRICLE             IVC RV S prime:     14.00 cm/s  IVC diam: 2.32 cm TAPSE (M-mode): 2.8 cm LEFT ATRIUM           Index       RIGHT ATRIUM           Index LA diam:      4.60 cm 1.87 cm/m  RA Area:     23.90 cm LA Vol (A4C): 76.9 ml 31.21 ml/m RA Volume:   75.60 ml  30.68 ml/m  AORTIC VALVE AV Area (Vmax):    1.74 cm AV Area (Vmean):   1.92 cm AV Area (VTI):     1.92 cm AV Vmax:           253.00 cm/s AV Vmean:          177.000 cm/s AV VTI:            0.528 m AV Peak Grad:      25.6 mmHg AV Mean Grad:      14.2 mmHg LVOT Vmax:         127.00 cm/s LVOT Vmean:        97.900 cm/s LVOT VTI:          0.293 m LVOT/AV VTI ratio: 0.55  AORTA Ao Root diam: 4.00 cm MITRAL VALVE                TRICUSPID VALVE MV Area (PHT): 3.30 cm     TR Peak grad:   30.0 mmHg MV Decel Time: 230 msec     TR Vmax:        274.00 cm/s MV E velocity: 109.00 cm/s MV A velocity: 121.00 cm/s  SHUNTS MV E/A ratio:  0.90         Systemic VTI:  0.29 m                             Systemic Diam: 2.10 cm Dorris Carnes MD Electronically signed by Dorris Carnes MD Signature Date/Time: 12/16/2019/1:41:34 PM    Final      Assessment/Plan:  79 y.o. male medical history significant of hypertension, hyperlipidemia, diabetes mellitus, COPD, asthma, OSA, former smoker, BPH, colon cancer, PAD, who presents with altered mental status, fall, and shaking. Patient initially could not give any history but this AM much more awake.  No prior hx of seizures but does state he has generalized tremors worse off on with  movement   - He is much more awake and talking close to baseline - EEG without seizure activity  - MRI Punctate 3 mm acute ischemic nonhemorrhagic cortical infarct involving the right occipital lobe as well as tiny 3 mm focus of diffusion abnormality involving the posterior left frontal cortex - Normal carotid UD - Strokes are likely in setting of small vessel disease rather then embolic in nature - pt is on ASA - Complaining 10/10 headache will give one dose of  decadron and magnesium - d/c planning from neurological stand point  12/17/2019, 11:56 AM

## 2019-12-17 NOTE — Progress Notes (Signed)
Patient discharging home. Instructions given to patient and verbalized understanding. IV removed. Wife and stepdaughter to transport patient home.

## 2019-12-17 NOTE — Discharge Summary (Signed)
Physician Discharge Summary  Justin Griffith JAS:[id number] DOB: [birth year] DOA: 12/14/2019  PCP: [name], NP  Admit date: 12/14/2019 Discharge date: 12/17/2019  Admitted From: Home Disposition: Home with Home Health PT/OT  Recommendations for Outpatient Follow-up:  1. Follow up with PCP in 1-2 weeks 2. Follow up with Cardiology within 1-2 weeks 3. Follow up with Neurology [name] within 1-2 weeks 4. Please obtain CMP/CBC, Mag, Phos in one week 5. Please follow up on the following pending results:  Home Health: YES Equipment/Devices: Shower/Tub Bench  Discharge Condition: Stable  CODE STATUS: FULL CODE Diet recommendation: Heart Healthy Carb Modified diet   Brief/Interim Summary: HPI per Dr. Ivor Costa on 12/14/19 Justin Griffith a 78 y.o.malewith medical history significant ofhypertension, hyperlipidemia, diabetes mellitus, COPD, asthma, OSA, former smoker, BPH, colon cancer, PAD, who presents with altered mental status, fall, seizure-like activity.  Patient has AMS,andis unable to provide accurate medical history. I called his daughter who alsodoes not know the detail information.Per report, patient was noted to be confused, complains of headache and had unwitnessed fall. EMS report, patient had 1 episode of seizure-like activities. Patient has mild cough, butdoes not have active respiratory distress, no active nausea,vomiting, diarrhea noted. Patient denies chest pain or shortness of breath. Not sure if patient has symptoms of UTI. No facial droop or slurred speech. He moves all extremities normally. When I saw pt in ED, he is confused. Heknows his own name and knows that he is in hospital. He is confused about time.   ED Course:pt was found to have WBC 5.6, pending urinalysis, negative Covid PCR, troponin level 29, 27, lactic acid 1.2, INR 1.1, electrolytes renal function okay, temperature normal, blood pressure 221/100, 176, 93, heart rate 52, RR 23,  oxygen saturation 92% on room air, which improved to100% on 2 L oxygen. CT of the head is negative for acute intracranial abnormalities. Chest x-ray showed atelectatic change left base with equivocal left pleural effusion.Pt isplaced on MedSurg [place] for observation.  **Interim History  Echocardiogram was ordered given his syncopal episode showed an EF of 60 to 65% with indeterminate left diastolic parameters.  Incidentally he was found to have an acute small punctate infarcts in the setting of small vessel disease.  Neurology has no other recommendations but recommending continuing aspirin.  EEG was normal and neurology recommending no antiepileptics but still needs echocardiogram done for syncope work-up and completion of stroke work-up.  His mental status is improved but today he had a unwitnessed fall so we will order another head CT scan and it showed "No evidence of acute intracranial abnormality. Small right posterior scalp contusion without underlying fracture."  Labs are stable and orthostatic vital signs watch him overnight again and obtain PT OT evaluation in the morning.    He is not orthostatic on repeat vital signs and actually his blood pressure was little bit higher so his IV fluid that was started this morning was stopped.  He did complain of a headache prior to discharge and was given IV Decadron, IV magnesium and IV ketorolac with improvement.  He was also started Fioricet with no real improvement in his headache.  Patient was deemed stable to be discharged and follow-up with PCP as neurology has deemed the patient stable for discharge from their perspective.  He will be going home on aspirin 325 mg p.o. daily and will need to follow-up with neurology at the [facility] clinic with [name] in 1 to 2 weeks.  Patient was understandable  and agreeable with plan of care and all questions were answered to satisfaction.   Discharge Diagnoses:  Principal Problem:   Acute metabolic  encephalopathy Active Problems:   Hypertension associated with diabetes ([place])   Type 2 diabetes mellitus with proteinuria (HCC)   Hyperlipidemia associated with type 2 diabetes mellitus (HCC)   BPH (benign prostatic hyperplasia)   COPD (chronic obstructive pulmonary disease) (HCC)   Fall   Elevated troponin   Syncope  Acute metabolic encephalopathy, improving and patient is back to baseline -Etiology is not clear. Per EMS report, patient had episode of seizure-like activity but wife states that he collapsed.  -Does not have a history of seizures  -CT head is negative for acute intracranial abnormalities.  Repeated head CT as he had a unwitnessed fall this morning and showed "No evidence of acute intracranial abnormality. 2. Small right posterior scalp contusion without underlying fracture." -Patient is taking primidone athome, not sure why patient is taking thismedication. Will continue thismedication. -Placed on MedSurg for observation and change to inpatient -Frequent neuro check -Get MRI ofbrain and EEG -MRI showed "Punctate 3 mm acute ischemic nonhemorrhagic cortical infarct involving the right occipital lobe. Additional tiny 3 mm focus of diffusion abnormality involving the posterior left frontal cortex, suspicious for a tiny acute to early subacute ischemic infarct.  No other acute intracranial abnormality. Age-related cerebral atrophy with chronic microvascular ischemic disease, mildly progressed from previous." -EEG done and showed "This study is within normal limits. No seizures or epileptiform discharges were seen throughout the recording."  -Seizure precautions hospitalized and did not have any seizure activity -When necessary Ativan for seizure -Check UDS and was Negative  -Follow-up urinalysis and was unremarkable but did show yellow-colored urine with 50 glucose and greater than 300 protein -Dr. Irish Elders of neurology saw in consultation and recommends no antiepileptics  at this time and likely feels that he would not need it given his EEG is normal -We will get PT OT to evaluate and treat and they are recommending home health -Continue with delirium precautions -continue monitor and his mental status is improving however he still remains a little drowsy today -Echocardiogram done below -Follow-up with neurology in outpatient setting  Hypertension associated with diabetes [facility]): -Blood pressure elevated initially, 221/100-->176/93. -Held Lasix since patient has decreased oral intake due to altered mental status -Continue Benicar-HCTZ but will discontinue the HCTZ component -IV hydralazine as needed will be increased to 10 mg and 2 every 4 hours as needed for his systolic blood pressure greater than 194 diastolic blood pressure greater than 100 -BP is now  159/79 at discharge -Adjusted some of his antihypertensives and he discontinued his hydrochlorothiazide altogether and reduce his Lasix dose to 20 mg p.o. daily and also reduced his carvedilol dose to half of 12.5 mg p.o. twice daily given his orthostasis Plan he received a dose of IV hydralazine prior to discharge and blood pressure is relatively stable now. -Continue to monitor blood pressures per protocol and will need to follow-up with PCP in outpatient setting  Type 2 diabetes mellitus with proteinuria ([place]) -Recent A1c 7.5, poorly controlled. Patient taking Lantus at home -Continue with sliding scale insulin -Decrease Lantus from 80 to 50 units daily -CBGs ranging from  140-197  Hyperlipidemia associated with type 2 diabetes mellitus (HCC) -Lipid panel done it showed a total cholesterol/HDL ratio of 4.5, cholesterol level of 135, HDL 30, and LDL of 60, triglycerides of 226, and VLDL 45 -Continue with Rosuvastatin 20 mg p.o. daily  BPH (  benign prostatic hyperplasia) -Currently getting tamsulosin 0.4 mg daily  COPD (chronic obstructive pulmonary disease) (HCC) -Continue with home  bronchodilators and also start DuoNeb scheduled and budesonide  Fall in outpatient setting and unwitnessed fall in the hospital -PT/OT evaluation recommending home health PT yesterday but PT to reevaluate today -Repeat head CT scan today showed "No evidence of acute intracranial abnormality. 2. Small right posterior scalp contusion without underlying Fracture." -We will need PT OT prior and they are recommending home oxygen  Acute CVA -Small and MRI showed punctate 3 mm acute ischemic nonhemorrhagic cortical infarct involving the right occipital lobe as well as a tiny 3 mm focus of diffusion abnormality involving the posterior left frontal cortex -Echocardiogram ordered an EF of 60 to 65% with no regional wall motion abnormalities but there was moderate left ventricular hypertrophy and left ventricular diastolic parameters were indeterminate.  Right ventricular systolic function was normal and he did have an AV valve that was thickened and calcified with mildly restricted motion.  Aortic valve regurgitation was not visualized and he had mild aortic valve stenosis. -Neurology following and he is currently on aspirin and he has had a lipid panel done as well as a hemoglobin A1c-PT OT evaluated and recommending home health and SLP signed off but patient had a unwitnessed fall today so we will have PT OT reevaluate again and is still recommend home health -Neurology feels that this patient has stroke in the setting of a small vessel disease rather embolic in nature -Neurology recommended full dose aspirin 325 mg p.o. daily given that he was on 81 mg as an outpatient -Lipid panel done and showed a total cholesterol/HDL ratio 4.5, cholesterol level of 135, HDL cholesterol of 30, LDL of 60, triglycerides of 226, VLDL 45 -Continue with his home statin -Defer to neurology for further recommendations and they recommend follow-up in outpatient setting with [name] in 1 to 2 weeks  Elevated  Troponin: -Troponin29-->27. Likely due to demand ischemia secondary to elevated blood pressure. -on ASA and crestor -Trend troponin -Check A1c, FLP -Get EKG now and repeat EKG in morning -Check UDS test was negative  Hyperbilirubinemia -Mild -T bili went from 1.2 and is now 0.9 -continue monitor and trend repeat CMP in a.m.  Syncope and Collapse -Unknown Etiology could be in the setting of dehydration with his furosemide and hydrochlorothiazide usage -Admitted as Obs but will change to Inpatient as needs ECHO to be done -EEG done and unremarkable and MRI done as well and showed punctate hemorrhages and an acute CVA -Trend troponins and ranging from 29 -> 7 -> 40 -PT OT recommending home health -Checked D-dimer and it was negative lower extremity venous duplex was negative for VTE -Echocardiogram done and showed normal EF and interimniate Diastolic Parameters  -We will need orthostatic vital signs done morning and actually his blood pressure was elevated so he was given hydralazine prior to discharge with improvement in his blood pressure -Stable to be discharged  Headache -Frontal headache per neurology evaluation X-given Decadron and magnesium as well as Toradol with improvement -I started Fioricet given the patient complained that he felt like a migraine and it did not help -Follow-up with Neurology in the outpatient setting   Essential Tremors -Likely this is why he is on primidone -Continue to monitor and appreciate neurology consultation -Continue to monitor carefully -Need outpatient follow-up with neurology and will refer to [name] per Dr. Beryl Meager recommendations  Obesity -Estimated body mass index is 38.18 kg/m as calculated  from the following:   Height as of this encounter: 6' (1.829 m).   Weight as of this encounter: 127.7 kg. -Weight Loss and Dietary Counseling given  Discharge Instructions  Discharge Instructions    Call MD for:  difficulty  breathing, headache or visual disturbances   Complete by: As directed    Call MD for:  extreme fatigue   Complete by: As directed    Call MD for:  hives   Complete by: As directed    Call MD for:  persistant dizziness or light-headedness   Complete by: As directed    Call MD for:  persistant nausea and vomiting   Complete by: As directed    Call MD for:  redness, tenderness, or signs of infection (pain, swelling, redness, odor or green/yellow discharge around incision site)   Complete by: As directed    Call MD for:  severe uncontrolled pain   Complete by: As directed    Call MD for:  temperature >100.4   Complete by: As directed    Diet - low sodium heart healthy   Complete by: As directed    Discharge instructions   Complete by: As directed    You were cared for by a hospitalist during your hospital stay. If you have any questions about your discharge medications or the care you received while you were in the hospital after you are discharged, you can call the unit and ask to speak with the hospitalist on call if the hospitalist that took care of you is not available. Once you are discharged, your primary care physician will handle any further medical issues. Please note that NO REFILLS for any discharge medications will be authorized once you are discharged, as it is imperative that you return to your primary care physician (or establish a relationship with a primary care physician if you do not have one) for your aftercare needs so that they can reassess your need for medications and monitor your lab values.  Follow up with PCP, Neurology, and with Cardiology in the outpatient setting. Take all medications as prescribed. If symptoms change or worsen please return to the ED for evaluation   Increase activity slowly   Complete by: As directed      Allergies as of 12/17/2019      Reactions   Farxiga [dapagliflozin] Rash   Jardiance [empagliflozin] Rash      Medication List    STOP  taking these medications   olmesartan-hydrochlorothiazide 40-25 MG tablet Commonly known as: BENICAR HCT     TAKE these medications   acyclovir 400 MG tablet Commonly known as: ZOVIRAX Take 400 mg by mouth 2 (two) times daily.   aspirin 325 MG EC tablet Take 1 tablet (325 mg total) by mouth daily. Start taking on: December 18, 2019 What changed:   medication strength  how much to take   azelastine 0.1 % nasal spray Commonly known as: ASTELIN Place 2 sprays into both nostrils 2 (two) times daily.   carvedilol 12.5 MG tablet Commonly known as: COREG Take 2 tablets (25 mg total) by mouth 2 (two) times daily with a meal. What changed: medication strength   clobetasol cream 0.05 % Commonly known as: TEMOVATE Apply 1 application topically 2 (two) times daily. Apply to bilateral lower legs.   furosemide 20 MG tablet Commonly known as: LASIX Take 1 tablet (20 mg total) by mouth daily. What changed:   medication strength  how much to take   gabapentin 300 MG  capsule Commonly known as: NEURONTIN Start out taking 1 capsule (300 MG) at bedtime by mouth and if tolerated may increase 2 capsules (600 MG) at bedtime.   insulin glargine 100 UNIT/ML injection Commonly known as: Lantus Inject 0.5 mLs (50 Units total) into the skin daily. Start taking on: December 18, 2019 What changed: how much to take   ipratropium-albuterol 0.5-2.5 (3) MG/3ML Soln Commonly known as: DUONEB Take 3 mLs by nebulization every 6 (six) hours.   mupirocin ointment 2 % Commonly known as: BACTROBAN Apply 1 application topically 2 (two) times daily. To open blisters bilateral legs.   naproxen 500 MG tablet Commonly known as: NAPROSYN Take 500 mg by mouth 2 (two) times daily with a meal. Arthritis   olmesartan 40 MG tablet Commonly known as: BENICAR Take 1 tablet (40 mg total) by mouth daily.   primidone 50 MG tablet Commonly known as: MYSOLINE Take 1 tablet (50 mg total) by mouth 2 (two)  times daily.   Qvar RediHaler 40 MCG/ACT inhaler Generic drug: beclomethasone 1 puff 2 (two) times daily.   rosuvastatin 40 MG tablet Commonly known as: CRESTOR Take 1 tablet (40 mg total) by mouth at bedtime. What changed: additional instructions   tamsulosin 0.4 MG Caps capsule Commonly known as: FLOMAX Take 0.4 mg by mouth daily. Patient restarted on his own due to trouble urinating   vitamin B-12 500 MCG tablet Commonly known as: CYANOCOBALAMIN Take 1,000 mcg by mouth daily. Two at night   Vitamin D (Cholecalciferol) 25 MCG (1000 UT) Caps Take 50 mcg by mouth daily. 2 a night            Durable Medical Equipment  (From admission, onward)         Start     Ordered   12/17/19 1653  DME Shower stool  Once        12/17/19 1711   12/17/19 1646  For home use only DME Tub bench  Once        12/17/19 1645          Allergies  Allergen Reactions  . Farxiga [Dapagliflozin] Rash  . Jardiance [Empagliflozin] Rash    Consultations:  Neurology  Procedures/Studies: EEG  Result Date: 12/15/2019 Lora Havens, MD     12/15/2019  5:25 PM Patient Name: Justin Griffith MRN: [id number] Epilepsy Attending: Lora Havens Referring Physician/Provider: Dr Hilton Sinclair Date: 12/15/2019 Duration: 20.39 mins Patient history: 79 y.o. male medical history significant ofhypertension, hyperlipidemia, diabetes mellitus, COPD, asthma, OSA, former smoker, BPH, colon cancer, PAD, who presents with altered mental status, fall, and shaking. EEG to evaluate for seizure. Level of alertness: Awake,asleep AEDs during EEG study: Gabapentin Technical aspects: This EEG study was done with scalp electrodes positioned according to the 10-20 International system of electrode placement. Electrical activity was acquired at a sampling rate of 500Hz  and reviewed with a high frequency filter of 70Hz  and a low frequency filter of 1Hz . EEG data were recorded continuously and digitally stored. Description:  The posterior dominant rhythm consists of 8 Hz activity of moderate voltage (25-35 uV) seen predominantly in posterior head regions, symmetric and reactive to eye opening and eye closing. Sleep was characterized by vertex waves, sleep spindles (12 to 14 Hz), maximal frontocentral region.   Physiologic photic driving was not seen during photic stimulation.  Hyperventilation was not performed.   IMPRESSION: This study is within normal limits. No seizures or epileptiform discharges were seen throughout the recording. Priyanka Barbra Sarks  CT HEAD WO CONTRAST  Result Date: 12/16/2019 CLINICAL DATA:  Unwitnessed fall. EXAM: CT HEAD WITHOUT CONTRAST TECHNIQUE: Contiguous axial images were obtained from the base of the skull through the vertex without intravenous contrast. COMPARISON:  MRI and CT 12/14/2019 FINDINGS: Brain: No evidence of acute large vascular territory infarct, acute hemorrhage, hydrocephalus, extra-axial collection or mass lesion. No abnormal mass effect. Tiny punctate infarcts seen on prior MRI are not apparent by CT. Similar patchy white matter hypoattenuation, compatible with the sequela of chronic microvascular ischemic disease. Vascular: Calcific intracranial atherosclerosis. Skull: Small posterior right scalp contusion negative for fracture or focal lesion. Sinuses/Orbits: Negative. Other: Partially absent right-sided ossicles, better characterized on prior CT temporal bone. Under pneumatization of bilateral mastoid air cells with bilateral chronic opacification. IMPRESSION: 1. No evidence of acute intracranial abnormality. 2. Small right posterior scalp contusion without underlying fracture. Electronically Signed   By: Margaretha Sheffield MD   On: 12/16/2019 13:10   CT Head Wo Contrast  Result Date: 12/14/2019 CLINICAL DATA:  Mental status change, fall. EXAM: CT HEAD WITHOUT CONTRAST TECHNIQUE: Contiguous axial images were obtained from the base of the skull through the vertex without  intravenous contrast. COMPARISON:  11/13/2019 FINDINGS: Brain: No evidence of acute large vascular territory infarction, hemorrhage, hydrocephalus, extra-axial collection or mass lesion/mass effect. Similar patchy white matter hypoattenuation, most likely the sequela of chronic microvascular ischemic disease. Vascular: Calcific intracranial atherosclerosis. Skull: Normal. Negative for fracture or focal lesion. Sinuses/Orbits: Mild ethmoid air cell mucosal thickening. Otherwise, the sinuses are clear. No acute orbital abnormality. Bilateral native lens replacements. Other: Partially absent right-sided ossicles, better characterized on prior CT temporal bone. Under pneumatization of bilateral mastoid air cells with bilateral chronic opacification. IMPRESSION: 1. No evidence of acute intracranial abnormality. 2. Chronic opacification of bilateral mastoid air cells. Electronically Signed   By: Margaretha Sheffield MD   On: 12/14/2019 13:26   MR BRAIN WO CONTRAST  Result Date: 12/14/2019 CLINICAL DATA:  Initial evaluation for acute seizure. EXAM: MRI HEAD WITHOUT CONTRAST TECHNIQUE: Multiplanar, multiecho pulse sequences of the brain and surrounding structures were obtained without intravenous contrast. COMPARISON:  Prior CT from earlier the same day as well as previous MRI from 04/21/2019. FINDINGS: Brain: Generalized age-related cerebral atrophy. Patchy and confluent T2/FLAIR hyperintensity within the periventricular deep white matter both cerebral hemispheres, nonspecific, but most commonly related to chronic microvascular ischemic disease. Overall, appearance is mildly progressed from previous. Punctate 3 mm focus of restricted diffusion involving the cortical gray matter of the right occipital lobe consistent with a small acute ischemic infarct (series 5, image 26). Additional tiny 3 mm focus of diffusion abnormality at the posterior left frontal cortex also suspicious for a tiny acute to early subacute ischemic  infarct (series 5, image 38). No associated hemorrhage or mass effect about these lesions. No other foci of restricted diffusion to suggest acute or subacute ischemia. Gray-white matter differentiation otherwise maintained. No other areas of encephalomalacia to suggest chronic cortical infarction. No acute intracranial hemorrhage. Single punctate focus of susceptibility artifact noted within the right temporal lobe, consistent with a small chronic microhemorrhage, likely small vessel related (series 3, image 23). No other areas of chronic hemorrhage identified. No mass lesion, midline shift or mass effect. No hydrocephalus or extra-axial fluid collection. No intrinsic temporal lobe abnormality. Pituitary gland suprasellar region within normal limits. Midline structures intact. Vascular: Major intracranial vascular flow voids are maintained. Skull and upper cervical spine: Craniocervical junction within normal limits. Bone marrow signal intensity normal. No scalp  soft tissue abnormality. Sinuses/Orbits: Patient status post bilateral ocular lens replacement. Globes and orbital soft tissues demonstrate no acute finding. Paranasal sinuses are largely clear. Moderate bilateral mastoid effusions. Visualized nasopharynx within normal limits. Other: None. IMPRESSION: 1. 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 2. Additional tiny 3 mm focus of diffusion abnormality involving the posterior left frontal cortex, suspicious for a tiny acute to early subacute ischemic infarct. 3. No other acute intracranial abnormality. 4. Age-related cerebral atrophy with chronic microvascular ischemic disease, mildly progressed from previous. Electronically Signed   By: Jeannine Boga M.D.   On: 12/14/2019 21:44   US Carotid Bilateral  Result Date: 12/15/2019 CLINICAL DATA:  Hypertension, stroke symptoms EXAM: BILATERAL CAROTID DUPLEX ULTRASOUND TECHNIQUE: Pearline Cables scale imaging,  color Doppler and duplex ultrasound were performed of bilateral carotid and vertebral arteries in the neck. COMPARISON:  None. FINDINGS: Criteria: Quantification of carotid stenosis is based on velocity parameters that correlate the residual internal carotid diameter with NASCET-based stenosis levels, using the diameter of the distal internal carotid lumen as the denominator for stenosis measurement. The following velocity measurements were obtained: RIGHT ICA: 77/16 cm/sec CCA: 70/2 cm/sec SYSTOLIC ICA/CCA RATIO:  1.2 ECA: 224 cm/sec LEFT ICA: 75/10 cm/sec CCA: 63/78 cm/sec SYSTOLIC ICA/CCA RATIO:  1.0 ECA: 161 cm/sec RIGHT CAROTID ARTERY: Minor echogenic shadowing plaque formation. No hemodynamically significant right ICA stenosis, velocity elevation, or turbulent flow. Degree of narrowing less than 50%. RIGHT VERTEBRAL ARTERY:  Normal antegrade flow LEFT CAROTID ARTERY: Similar scattered minor echogenic plaque formation. No hemodynamically significant left ICA stenosis, velocity elevation, or turbulent flow. LEFT VERTEBRAL ARTERY:  Normal antegrade flow IMPRESSION: Minor carotid atherosclerosis. No hemodynamically significant ICA stenosis. Degree of narrowing less than 50% bilaterally by ultrasound criteria. Patent antegrade vertebral flow bilaterally Electronically Signed   By: Jerilynn Mages.  Shick M.D.   On: 12/15/2019 09:20   US Venous Img Lower Bilateral (DVT)  Result Date: 12/15/2019 CLINICAL DATA:  79 year old male with history of leg pain for 1 month. EXAM: BILATERAL LOWER EXTREMITY VENOUS DOPPLER ULTRASOUND TECHNIQUE: Gray-scale sonography with graded compression, as well as color Doppler and duplex ultrasound were performed to evaluate the lower extremity deep venous systems from the level of the common femoral vein and including the common femoral, femoral, profunda femoral, popliteal and calf veins including the posterior tibial, peroneal and gastrocnemius veins when visible. The superficial great saphenous  vein was also interrogated. Spectral Doppler was utilized to evaluate flow at rest and with distal augmentation maneuvers in the common femoral, femoral and popliteal veins. COMPARISON:  None. FINDINGS: RIGHT LOWER EXTREMITY Common Femoral Vein: No evidence of thrombus. Normal compressibility, respiratory phasicity and response to augmentation. Saphenofemoral Junction: No evidence of thrombus. Normal compressibility and flow on color Doppler imaging. Profunda Femoral Vein: No evidence of thrombus. Normal compressibility and flow on color Doppler imaging. Femoral Vein: No evidence of thrombus. Normal compressibility, respiratory phasicity and response to augmentation. Popliteal Vein: No evidence of thrombus. Normal compressibility, respiratory phasicity and response to augmentation. Calf Veins: No evidence of thrombus. Normal compressibility and flow on color Doppler imaging. Superficial Great Saphenous Vein: No evidence of thrombus. Normal compressibility. Other Findings:  None. LEFT LOWER EXTREMITY Common Femoral Vein: No evidence of thrombus. Normal compressibility, respiratory phasicity and response to augmentation. Saphenofemoral Junction: No evidence of thrombus. Normal compressibility and flow on color Doppler imaging. Profunda Femoral Vein: No evidence of thrombus. Normal compressibility and flow on color Doppler imaging. Femoral Vein: No evidence of thrombus.  Normal compressibility, respiratory phasicity and response to augmentation. Popliteal Vein: No evidence of thrombus. Normal compressibility, respiratory phasicity and response to augmentation. Calf Veins: No evidence of thrombus. Normal compressibility and flow on color Doppler imaging. Superficial Great Saphenous Vein: No evidence of thrombus. Normal compressibility. Other Findings:  None. IMPRESSION: No evidence of bilateral lower extremity deep venous thrombosis. Electronically Signed   By: Ruthann Cancer MD   On: 12/15/2019 16:23   DG Chest  Portable 1 View  Result Date: 12/14/2019 CLINICAL DATA:  Altered mental status with recent fall EXAM: PORTABLE CHEST 1 VIEW COMPARISON:  January 11, 2019 FINDINGS: There is mild atelectatic change in the left base with equivocal small left pleural effusion. Lungs elsewhere are clear. Heart is upper normal in size with pulmonary vascularity normal. No adenopathy. Degenerative change noted in the thoracic spine. IMPRESSION: Atelectatic change left base with equivocal left pleural effusion. Lungs elsewhere clear. Stable cardiac silhouette. Electronically Signed   By: Lowella Grip III M.D.   On: 12/14/2019 13:36   ECHOCARDIOGRAM COMPLETE  Result Date: 12/16/2019    ECHOCARDIOGRAM REPORT   Patient Name:   Justin Griffith [facility] Date of Exam: 12/16/2019 Medical Rec #:  [id number]      Height:       72.0 in Accession #:    [id number]     Weight:       281.5 lb Date of Birth:  [birth year]      BSA:          2.464 m Patient Age:    49 years       BP:           137/70 mmHg Patient Gender: M              HR:           54 bpm. Exam Location:  [facility] Procedure: 2D Echo Indications:     STROKE 434.91/I63.9  History:         Patient has no prior history of Echocardiogram examinations.                  COPD, Signs/Symptoms:Syncope; Risk Factors:Diabetes,                  Hypertension and Dyslipidemia.  Sonographer:     Avanell Shackleton Referring Phys:  [id number] Georgina Quint [facility] Diagnosing Phys: Dorris Carnes MD IMPRESSIONS  1. Left ventricular ejection fraction, by estimation, is 60 to 65%. The left ventricle has normal function. The left ventricle has no regional wall motion abnormalities. There is moderate left ventricular hypertrophy. Left ventricular diastolic parameters are indeterminate.  2. Right ventricular systolic function is normal. The right ventricular size is normal.  3. The mitral valve is normal in structure. Trivial mitral valve regurgitation.  4. AV is thickened, calcified with mildly restricted motion. Peak and mean  gradients through the AV are 26 and 14 mm Hg consistent with mild AS. Marland Kitchen The aortic valve is abnormal. Aortic valve regurgitation is not visualized. Mild aortic valve stenosis.  5. The inferior vena cava is dilated in size with >50% respiratory variability, suggesting right atrial pressure of 8 mmHg. FINDINGS  Left Ventricle: Left ventricular ejection fraction, by estimation, is 60 to 65%. The left ventricle has normal function. The left ventricle has no regional wall motion abnormalities. The left ventricular internal cavity size was normal in size. There is  moderate left ventricular hypertrophy. Left ventricular diastolic parameters are indeterminate. Right Ventricle: The right ventricular size is  normal. No increase in right ventricular wall thickness. Right ventricular systolic function is normal. Left Atrium: Left atrial size was normal in size. Right Atrium: Right atrial size was normal in size. Pericardium: There is no evidence of pericardial effusion. Mitral Valve: The mitral valve is normal in structure. Trivial mitral valve regurgitation. Tricuspid Valve: The tricuspid valve is normal in structure. Tricuspid valve regurgitation is trivial. Aortic Valve: AV is thickened, calcified with mildly restricted motion. Peak and mean gradients through the AV are 26 and 14 mm Hg consistent with mild AS. The aortic valve is abnormal. Aortic valve regurgitation is not visualized. Mild aortic stenosis is present. Aortic valve mean gradient measures 14.2 mmHg. Aortic valve peak gradient measures 25.6 mmHg. Aortic valve area, by VTI measures 1.92 cm. Pulmonic Valve: The pulmonic valve was not well visualized. Pulmonic valve regurgitation is not visualized. Aorta: The aortic root is normal in size and structure. Venous: The inferior vena cava is dilated in size with greater than 50% respiratory variability, suggesting right atrial pressure of 8 mmHg. IAS/Shunts: No atrial level shunt detected by color flow Doppler.  LEFT  VENTRICLE PLAX 2D LVIDd:         4.67 cm  Diastology LVIDs:         3.13 cm  LV e' medial:    4.57 cm/s LV PW:         1.51 cm  LV E/e' medial:  23.9 LV IVS:        1.56 cm  LV e' lateral:   5.77 cm/s LVOT diam:     2.10 cm  LV E/e' lateral: 18.9 LV SV:         101 LV SV Index:   41 LVOT Area:     3.46 cm  RIGHT VENTRICLE             IVC RV S prime:     14.00 cm/s  IVC diam: 2.32 cm TAPSE (M-mode): 2.8 cm LEFT ATRIUM           Index       RIGHT ATRIUM           Index LA diam:      4.60 cm 1.87 cm/m  RA Area:     23.90 cm LA Vol (A4C): 76.9 ml 31.21 ml/m RA Volume:   75.60 ml  30.68 ml/m  AORTIC VALVE AV Area (Vmax):    1.74 cm AV Area (Vmean):   1.92 cm AV Area (VTI):     1.92 cm AV Vmax:           253.00 cm/s AV Vmean:          177.000 cm/s AV VTI:            0.528 m AV Peak Grad:      25.6 mmHg AV Mean Grad:      14.2 mmHg LVOT Vmax:         127.00 cm/s LVOT Vmean:        97.900 cm/s LVOT VTI:          0.293 m LVOT/AV VTI ratio: 0.55  AORTA Ao Root diam: 4.00 cm MITRAL VALVE                TRICUSPID VALVE MV Area (PHT): 3.30 cm     TR Peak grad:   30.0 mmHg MV Decel Time: 230 msec     TR Vmax:        274.00 cm/s MV E velocity: 109.00  cm/s MV A velocity: 121.00 cm/s  SHUNTS MV E/A ratio:  0.90         Systemic VTI:  0.29 m                             Systemic Diam: 2.10 cm Dorris Carnes MD Electronically signed by Dorris Carnes MD Signature Date/Time: 12/16/2019/1:41:34 PM    Final     ECHOCARDIOGRAM IMPRESSIONS    1. Left ventricular ejection fraction, by estimation, is 60 to 65%. The  left ventricle has normal function. The left ventricle has no regional  wall motion abnormalities. There is moderate left ventricular hypertrophy.  Left ventricular diastolic  parameters are indeterminate.  2. Right ventricular systolic function is normal. The right ventricular  size is normal.  3. The mitral valve is normal in structure. Trivial mitral valve  regurgitation.  4. AV is thickened, calcified with  mildly restricted motion. Peak and  mean gradients through the AV are 26 and 14 mm Hg consistent with mild AS.  Marland Kitchen The aortic valve is abnormal. Aortic valve regurgitation is not  visualized. Mild aortic valve stenosis.  5. The inferior vena cava is dilated in size with >50% respiratory  variability, suggesting right atrial pressure of 8 mmHg.   FINDINGS  Left Ventricle: Left ventricular ejection fraction, by estimation, is 60  to 65%. The left ventricle has normal function. The left ventricle has no  regional wall motion abnormalities. The left ventricular internal cavity  size was normal in size. There is  moderate left ventricular hypertrophy. Left ventricular diastolic  parameters are indeterminate.   Right Ventricle: The right ventricular size is normal. No increase in  right ventricular wall thickness. Right ventricular systolic function is  normal.   Left Atrium: Left atrial size was normal in size.   Right Atrium: Right atrial size was normal in size.   Pericardium: There is no evidence of pericardial effusion.   Mitral Valve: The mitral valve is normal in structure. Trivial mitral  valve regurgitation.   Tricuspid Valve: The tricuspid valve is normal in structure. Tricuspid  valve regurgitation is trivial.   Aortic Valve: AV is thickened, calcified with mildly restricted motion.  Peak and mean gradients through the AV are 26 and 14 mm Hg consistent with  mild AS. The aortic valve is abnormal. Aortic valve regurgitation is not  visualized. Mild aortic stenosis  is present. Aortic valve mean gradient measures 14.2 mmHg. Aortic valve  peak gradient measures 25.6 mmHg. Aortic valve area, by VTI measures 1.92  cm.   Pulmonic Valve: The pulmonic valve was not well visualized. Pulmonic valve  regurgitation is not visualized.   Aorta: The aortic root is normal in size and structure.   Venous: The inferior vena cava is dilated in size with greater than 50%  respiratory  variability, suggesting right atrial pressure of 8 mmHg.   IAS/Shunts: No atrial level shunt detected by color flow Doppler.     LEFT VENTRICLE  PLAX 2D  LVIDd:     4.67 cm Diastology  LVIDs:     3.13 cm LV e' medial:  4.57 cm/s  LV PW:     1.51 cm LV E/e' medial: 23.9  LV IVS:    1.56 cm LV e' lateral:  5.77 cm/s  LVOT diam:   2.10 cm LV E/e' lateral: 18.9  LV SV:     101  LV SV Index:  41  LVOT Area:  3.46 cm     RIGHT VENTRICLE       IVC  RV S prime:   14.00 cm/s IVC diam: 2.32 cm  TAPSE (M-mode): 2.8 cm   LEFT ATRIUM      Index    RIGHT ATRIUM      Index  LA diam:   4.60 cm 1.87 cm/m RA Area:   23.90 cm  LA Vol (A4C): 76.9 ml 31.21 ml/m RA Volume:  75.60 ml 30.68 ml/m  AORTIC VALVE  AV Area (Vmax):  1.74 cm  AV Area (Vmean):  1.92 cm  AV Area (VTI):   1.92 cm  AV Vmax:      253.00 cm/s  AV Vmean:     177.000 cm/s  AV VTI:      0.528 m  AV Peak Grad:   25.6 mmHg  AV Mean Grad:   14.2 mmHg  LVOT Vmax:     127.00 cm/s  LVOT Vmean:    97.900 cm/s  LVOT VTI:     0.293 m  LVOT/AV VTI ratio: 0.55    AORTA  Ao Root diam: 4.00 cm   MITRAL VALVE        TRICUSPID VALVE  MV Area (PHT): 3.30 cm   TR Peak grad:  30.0 mmHg  MV Decel Time: 230 msec   TR Vmax:    274.00 cm/s  MV E velocity: 109.00 cm/s  MV A velocity: 121.00 cm/s SHUNTS  MV E/A ratio: 0.90     Systemic VTI: 0.29 m               Systemic Diam: 2.10 cm   Subjective: Seen and examined at bedside and he was doing fairly well today.  Had headache this morning but it improved later this evening.  Denies any chest pain, lightheadedness or dizziness.  No nausea or vomiting.  Felt back to his baseline.  No other concerns or plans at this time and ready to be discharged home with home health and will need to see PCP, cardiology and neurology and understands agrees with  plan of care.   Discharge Exam: Vitals:   12/17/19 0701 12/17/19 1621  BP:  (!) 159/74  Pulse:  (!) 58  Resp:    Temp:  97.9 F (36.6 C)  SpO2: 93% 94%   Vitals:   12/17/19 0015 12/17/19 0347 12/17/19 0701 12/17/19 1621  BP: (!) 172/78 (!) 171/85  (!) 159/74  Pulse: (!) 59 (!) 59  (!) 58  Resp: 18 18    Temp: 97.8 F (36.6 C) 98.3 F (36.8 C)  97.9 F (36.6 C)  TempSrc:  Oral  Oral  SpO2: 93% 94% 93% 94%  Weight:      Height:       General: Pt is alert, awake, not in acute distress but was complaining of a headache Cardiovascular: RRR, S1/S2 +, no rubs, no gallops Respiratory: Diminished bilaterally, no wheezing, no rhonchi,: Unlabored breathing and not wearing any supplemental oxygen via nasal cannula Abdominal: Soft, NT, Distended 2/2 body habitus, bowel sounds + Extremities: no edema, no cyanosis  The results of significant diagnostics from this hospitalization (including imaging, microbiology, ancillary and laboratory) are listed below for reference.    Microbiology: Recent Results (from the past 240 hour(s))  Respiratory Panel by RT PCR (Flu A&B, Covid) - Nasopharyngeal Swab     Status: None   Collection Time: 12/14/19  1:26 PM   Specimen: Nasopharyngeal Swab  Result Value Ref Range Status  SARS Coronavirus 2 by RT PCR NEGATIVE NEGATIVE Final    Comment: (NOTE) SARS-CoV-2 target nucleic acids are NOT DETECTED.  The SARS-CoV-2 RNA is generally detectable in upper respiratoy specimens during the acute phase of infection. The lowest concentration of SARS-CoV-2 viral copies this assay can detect is 131 copies/mL. A negative result does not preclude SARS-Cov-2 infection and should not be used as the sole basis for treatment or other patient management decisions. A negative result may occur with  improper specimen collection/handling, submission of specimen other than nasopharyngeal swab, presence of viral mutation(s) within the areas targeted by this assay, and  inadequate number of viral copies (<131 copies/mL). A negative result must be combined with clinical observations, patient history, and epidemiological information. The expected result is Negative.  Fact Sheet for Patients:  PinkCheek.be  Fact Sheet for Healthcare Providers:  GravelBags.it  This test is no t yet approved or cleared by the [place] FDA and  has been authorized for detection and/or diagnosis of SARS-CoV-2 by FDA under an Emergency Use Authorization (EUA). This EUA will remain  in effect (meaning this test can be used) for the duration of the COVID-19 declaration under Section 564(b)(1) of the Act, 21 U.S.C. section 360bbb-3(b)(1), unless the authorization is terminated or revoked sooner.     Influenza A by PCR NEGATIVE NEGATIVE Final   Influenza B by PCR NEGATIVE NEGATIVE Final    Comment: (NOTE) The Xpert Xpress SARS-CoV-2/FLU/RSV assay is intended as an aid in  the diagnosis of influenza from Nasopharyngeal swab specimens and  should not be used as a sole basis for treatment. Nasal washings and  aspirates are unacceptable for Xpert Xpress SARS-CoV-2/FLU/RSV  testing.  Fact Sheet for Patients: PinkCheek.be  Fact Sheet for Healthcare Providers: GravelBags.it  This test is not yet approved or cleared by the [place] FDA and  has been authorized for detection and/or diagnosis of SARS-CoV-2 by  FDA under an Emergency Use Authorization (EUA). This EUA will remain  in effect (meaning this test can be used) for the duration of the  Covid-19 declaration under Section 564(b)(1) of the Act, 21  U.S.C. section 360bbb-3(b)(1), unless the authorization is  terminated or revoked. Performed at [facility], [address] [place], [place] [address]     Labs: BNP (last 3 results) No results for input(s): BNP in the last 8760  hours. Basic Metabolic Panel: Recent Labs  Lab 12/14/19 1250 12/15/19 0503 12/16/19 0511 12/17/19 0258  NA 133* 137 138 136  K 3.9 3.6 3.7 3.7  CL 97* 100 101 100  CO2 28 29 29 28   GLUCOSE 201* 139* 138* 147*  BUN 13 15 18 19   CREATININE 0.89 1.02 1.09 0.92  CALCIUM 10.3 10.3 10.3 10.3  MG  --  1.9 2.0 2.0  PHOS  --  3.3 3.4 3.0   Liver Function Tests: Recent Labs  Lab 12/14/19 1250 12/15/19 0503 12/16/19 0511 12/17/19 0258  AST 20 20 14* 13*  ALT 20 18 17 18   ALKPHOS 46 40 46 44  BILITOT 1.2 1.3* 0.9 0.9  PROT 7.0 6.2* 6.1* 6.6  ALBUMIN 3.5 3.1* 3.1* 3.3*   No results for input(s): LIPASE, AMYLASE in the last 168 hours. No results for input(s): AMMONIA in the last 168 hours. CBC: Recent Labs  Lab 12/14/19 1249 12/15/19 0503 12/16/19 0511 12/17/19 0258  WBC 5.6 7.1 6.6 6.4  NEUTROABS 3.6  --  3.6 3.4  HGB 15.8 14.8 14.3 14.8  HCT 44.7 41.9  40.4 43.3  MCV 88.9 88.2 89.6 91.5  PLT 200 182 185 197   Cardiac Enzymes: No results for input(s): CKTOTAL, CKMB, CKMBINDEX, TROPONINI in the last 168 hours. BNP: Invalid input(s): POCBNP CBG: Recent Labs  Lab 12/16/19 2102 12/16/19 2130 12/17/19 0813 12/17/19 1209 12/17/19 1639  GLUCAP 177* 177* 171* 197* 148*   D-Dimer No results for input(s): DDIMER in the last 72 hours. Hgb A1c Recent Labs    12/15/19 0503  HGBA1C 7.5*   Lipid Profile Recent Labs    12/15/19 0503  CHOL 135  HDL 30*  LDLCALC 60  TRIG 226*  CHOLHDL 4.5   Thyroid function studies No results for input(s): TSH, T4TOTAL, T3FREE, THYROIDAB in the last 72 hours.  Invalid input(s): FREET3 Anemia work up No results for input(s): VITAMINB12, FOLATE, FERRITIN, TIBC, IRON, RETICCTPCT in the last 72 hours. Urinalysis    Component Value Date/Time   COLORURINE YELLOW (A) 12/14/2019 2015   APPEARANCEUR HAZY (A) 12/14/2019 2015   APPEARANCEUR Clear 10/20/2019 1327   LABSPEC 1.018 12/14/2019 2015   PHURINE 6.0 12/14/2019 2015    GLUCOSEU 50 (A) 12/14/2019 2015   HGBUR NEGATIVE 12/14/2019 2015   [place] NEGATIVE 12/14/2019 2015   BILIRUBINUR Negative 10/20/2019 1327   KETONESUR NEGATIVE 12/14/2019 2015   PROTEINUR >=300 (A) 12/14/2019 2015   NITRITE NEGATIVE 12/14/2019 2015   LEUKOCYTESUR NEGATIVE 12/14/2019 2015   Sepsis Labs Invalid input(s): PROCALCITONIN,  WBC,  LACTICIDVEN Microbiology Recent Results (from the past 240 hour(s))  Respiratory Panel by RT PCR (Flu A&B, Covid) - Nasopharyngeal Swab     Status: None   Collection Time: 12/14/19  1:26 PM   Specimen: Nasopharyngeal Swab  Result Value Ref Range Status   SARS Coronavirus 2 by RT PCR NEGATIVE NEGATIVE Final    Comment: (NOTE) SARS-CoV-2 target nucleic acids are NOT DETECTED.  The SARS-CoV-2 RNA is generally detectable in upper respiratoy specimens during the acute phase of infection. The lowest concentration of SARS-CoV-2 viral copies this assay can detect is 131 copies/mL. A negative result does not preclude SARS-Cov-2 infection and should not be used as the sole basis for treatment or other patient management decisions. A negative result may occur with  improper specimen collection/handling, submission of specimen other than nasopharyngeal swab, presence of viral mutation(s) within the areas targeted by this assay, and inadequate number of viral copies (<131 copies/mL). A negative result must be combined with clinical observations, patient history, and epidemiological information. The expected result is Negative.  Fact Sheet for Patients:  PinkCheek.be  Fact Sheet for Healthcare Providers:  GravelBags.it  This test is no t yet approved or cleared by the [place] FDA and  has been authorized for detection and/or diagnosis of SARS-CoV-2 by FDA under an Emergency Use Authorization (EUA). This EUA will remain  in effect (meaning this test can be used) for the duration of  the COVID-19 declaration under Section 564(b)(1) of the Act, 21 U.S.C. section 360bbb-3(b)(1), unless the authorization is terminated or revoked sooner.     Influenza A by PCR NEGATIVE NEGATIVE Final   Influenza B by PCR NEGATIVE NEGATIVE Final    Comment: (NOTE) The Xpert Xpress SARS-CoV-2/FLU/RSV assay is intended as an aid in  the diagnosis of influenza from Nasopharyngeal swab specimens and  should not be used as a sole basis for treatment. Nasal washings and  aspirates are unacceptable for Xpert Xpress SARS-CoV-2/FLU/RSV  testing.  Fact Sheet for Patients: PinkCheek.be  Fact Sheet for Healthcare Providers: GravelBags.it  This  test is not yet approved or cleared by the [place] and  has been authorized for detection and/or diagnosis of SARS-CoV-2 by  FDA under an Emergency Use Authorization (EUA). This EUA will remain  in effect (meaning this test can be used) for the duration of the  Covid-19 declaration under Section 564(b)(1) of the Act, 21  U.S.C. section 360bbb-3(b)(1), unless the authorization is  terminated or revoked. Performed at [facility], [address] [place], [place] [address]    Time coordinating discharge: 35 minutes  SIGNED:  Kerney Elbe, DO [facility] 12/17/2019, 6:53 PM Pager is on [place]  If 7PM-7AM, please contact night-coverage [email or web address]

## 2019-12-17 NOTE — TOC Initial Note (Signed)
Transition of Care [facility] - Initial/Assessment Note    Patient Details  Name: Justin Griffith MRN: [id number] Date of Birth: 07-16-1940  Transition of [facility] CM/SW Contact:    Boris Sharper, LCSW Phone Number: 12/17/2019, 3:03 PM  Clinical Narrative:                 CSW contacted pt and family to discuss discharge plans as well as PT recommendations. Family was agreeable to [facility] and did not have a preference in agencies. CSW contacted Malachy Mood with [facility] with referral and she was able to accept.    Expected Discharge Plan: [place] Barriers to Discharge: Continued Medical Work up   Patient Goals and CMS Choice Patient states their goals for this hospitalization and ongoing recovery are:: to be strong enough to return home CMS Medicare.gov Compare Post Acute Care list provided to:: Patient Choice offered to / list presented to : Patient  Expected Discharge Plan and Services Expected Discharge Plan: [address]       Living arrangements for the past 2 months: Single Family Home                           HH Arranged: PT [facility] Agency: [place] Date [place]: 12/17/19 Time HH Agency Contacted: 1501 Representative spoke with at [place]: Sharmon Revere  Prior Living Arrangements/Services Living arrangements for the past 2 months: [place] with:: Spouse Patient language and need for interpreter reviewed:: Yes Do you feel safe going back to the place where you live?: Yes        Care giver support system in place?: Yes (comment) (spouse, adult children)   Criminal Activity/Legal Involvement Pertinent to Current Situation/Hospitalization: No - Comment as needed  Activities of Daily Living      Permission Sought/Granted Permission sought to share information with : Facility Art therapist granted to share information with : Yes, Verbal Permission Granted  Share Information  with NAME: [name]     Permission granted to share info w Relationship: daughter  Permission granted to share info w Contact Information: [phone]  Emotional Assessment Appearance:: Other (Comment Required (unable to assess) Attitude/Demeanor/Rapport: Unable to Assess Affect (typically observed): Unable to Assess Orientation: : Oriented to Self, Oriented to Place, Oriented to  Time, Oriented to Situation Alcohol / Substance Use: Not Applicable Psych Involvement: No (comment)  Admission diagnosis:  Seizure-like activity ([place]) [R56.9] Altered mental status, unspecified altered mental status type [R41.82] Syncope, unspecified syncope type [P50] Acute metabolic encephalopathy [D32.67] Syncope [R55] Patient Active Problem List   Diagnosis Date Noted  . Syncope 12/15/2019  . Fall 12/14/2019  . Acute metabolic encephalopathy 12/45/8099  . Elevated troponin 12/14/2019  . Seizure-like activity ([place])   . Abscess 10/16/2019  . Pain in both lower extremities 07/14/2019  . Peripheral vascular disease with stasis dermatitis 05/23/2019  . B12 deficiency 04/23/2019  . Constipation 04/21/2019  . Tremor 11/14/2018  . Morbid obesity ([place]) 09/07/2017  . COPD (chronic obstructive pulmonary disease) ([place]) 04/29/2017  . Intertrigo 02/02/2017  . BPH (benign prostatic hyperplasia) 07/23/2016  . Advanced care planning/counseling discussion 07/23/2016  . Eosinophilic esophagitis 83/38/2505  . PAD (peripheral artery disease) ([place]) 01/08/2016  . Gastroesophageal reflux disease without esophagitis 01/06/2016  . Hyperlipidemia associated with type 2 diabetes mellitus ([place]) 01/16/2015  . Sleep apnea 01/16/2015  . BMI 40.0-44.9, adult ([place]) 01/16/2015  . Hypertension associated  with diabetes ([place]) 10/11/2014  . Type 2 diabetes mellitus with proteinuria ([place]) 10/11/2014  . Back pain 10/11/2014  . History of colon cancer    PCP:  [name], NP Pharmacy:   [place], [place] -  [address] [place] [address] Phone: [phone] Fax: [phone]     Social Determinants of Health (SDOH) Interventions    Readmission Risk Interventions No flowsheet data found.

## 2019-12-18 ENCOUNTER — Telehealth: Payer: Self-pay

## 2019-12-18 NOTE — Telephone Encounter (Signed)
Transition Care Management Follow-up Telephone Call  Date of discharge and from where: [facility] 12/17/2019  How have you been since you were released from the hospital? Weak, taking it easy  Any questions or concerns? No  Items Reviewed:  Did the pt receive and understand the discharge instructions provided? Yes   Medications obtained and verified? Yes   Any new allergies since your discharge? No   Dietary orders reviewed? Yes  Do you have support at home? Yes   Functional Questionnaire: (I = Independent and D = Dependent) ADLs: I  Bathing/Dressing- I  Meal Prep- D  Eating- I  Maintaining continence- I  Transferring/Ambulation- I  Managing Meds- I  Follow up appointments reviewed:   PCP Hospital f/u appt confirmed? Yes  Scheduled to see Marnee Guarneri on 01/01/2020 @ 11:00.  Are transportation arrangements needed? No   If their condition worsens, is the pt aware to call PCP or go to the Emergency Dept.? Yes  Was the patient provided with contact information for the PCP's office or ED? Yes  Was to pt encouraged to call back with questions or concerns? Yes

## 2019-12-19 ENCOUNTER — Ambulatory Visit: Payer: Medicare Other | Admitting: Physical Therapy

## 2019-12-20 DIAGNOSIS — Z85038 Personal history of other malignant neoplasm of large intestine: Secondary | ICD-10-CM | POA: Diagnosis not present

## 2019-12-20 DIAGNOSIS — E785 Hyperlipidemia, unspecified: Secondary | ICD-10-CM | POA: Diagnosis not present

## 2019-12-20 DIAGNOSIS — Z9181 History of falling: Secondary | ICD-10-CM | POA: Diagnosis not present

## 2019-12-20 DIAGNOSIS — Z794 Long term (current) use of insulin: Secondary | ICD-10-CM | POA: Diagnosis not present

## 2019-12-20 DIAGNOSIS — G9341 Metabolic encephalopathy: Secondary | ICD-10-CM | POA: Diagnosis not present

## 2019-12-20 DIAGNOSIS — E1151 Type 2 diabetes mellitus with diabetic peripheral angiopathy without gangrene: Secondary | ICD-10-CM | POA: Diagnosis not present

## 2019-12-20 DIAGNOSIS — G4733 Obstructive sleep apnea (adult) (pediatric): Secondary | ICD-10-CM | POA: Diagnosis not present

## 2019-12-20 DIAGNOSIS — J449 Chronic obstructive pulmonary disease, unspecified: Secondary | ICD-10-CM | POA: Diagnosis not present

## 2019-12-20 DIAGNOSIS — N400 Enlarged prostate without lower urinary tract symptoms: Secondary | ICD-10-CM | POA: Diagnosis not present

## 2019-12-20 DIAGNOSIS — Z6838 Body mass index (BMI) 38.0-38.9, adult: Secondary | ICD-10-CM | POA: Diagnosis not present

## 2019-12-20 DIAGNOSIS — Z791 Long term (current) use of non-steroidal anti-inflammatories (NSAID): Secondary | ICD-10-CM | POA: Diagnosis not present

## 2019-12-20 DIAGNOSIS — S0003XD Contusion of scalp, subsequent encounter: Secondary | ICD-10-CM | POA: Diagnosis not present

## 2019-12-20 DIAGNOSIS — Z8673 Personal history of transient ischemic attack (TIA), and cerebral infarction without residual deficits: Secondary | ICD-10-CM | POA: Diagnosis not present

## 2019-12-20 DIAGNOSIS — E1159 Type 2 diabetes mellitus with other circulatory complications: Secondary | ICD-10-CM | POA: Diagnosis not present

## 2019-12-20 DIAGNOSIS — E1169 Type 2 diabetes mellitus with other specified complication: Secondary | ICD-10-CM | POA: Diagnosis not present

## 2019-12-20 DIAGNOSIS — I152 Hypertension secondary to endocrine disorders: Secondary | ICD-10-CM | POA: Diagnosis not present

## 2019-12-20 DIAGNOSIS — G250 Essential tremor: Secondary | ICD-10-CM | POA: Diagnosis not present

## 2019-12-21 ENCOUNTER — Ambulatory Visit: Payer: Medicare Other | Admitting: Physical Therapy

## 2019-12-25 DIAGNOSIS — I152 Hypertension secondary to endocrine disorders: Secondary | ICD-10-CM | POA: Diagnosis not present

## 2019-12-25 DIAGNOSIS — E785 Hyperlipidemia, unspecified: Secondary | ICD-10-CM | POA: Diagnosis not present

## 2019-12-25 DIAGNOSIS — G9341 Metabolic encephalopathy: Secondary | ICD-10-CM | POA: Diagnosis not present

## 2019-12-25 DIAGNOSIS — E1169 Type 2 diabetes mellitus with other specified complication: Secondary | ICD-10-CM | POA: Diagnosis not present

## 2019-12-25 DIAGNOSIS — J449 Chronic obstructive pulmonary disease, unspecified: Secondary | ICD-10-CM | POA: Diagnosis not present

## 2019-12-25 DIAGNOSIS — E1159 Type 2 diabetes mellitus with other circulatory complications: Secondary | ICD-10-CM | POA: Diagnosis not present

## 2019-12-26 ENCOUNTER — Telehealth: Payer: Self-pay | Admitting: General Practice

## 2019-12-26 ENCOUNTER — Ambulatory Visit: Payer: Medicare Other | Admitting: Physical Therapy

## 2019-12-26 NOTE — Telephone Encounter (Signed)
  Chronic Care Management   Outreach Note  12/26/2019 Name: Justin Griffith MRN: [id number] DOB: 1940/08/29  Referred by: [name], NP Reason for referral : Chronic Care Management (RNCM: Emmi Red for post hospital discharge- 5 flags)   An unsuccessful telephone outreach was attempted today. The patient was referred to the case management team for assistance with care management and care coordination.   Follow Up Plan: The care management team will reach out to the patient again over the next 2 to 7  days.   Noreene Larsson RN, MSN, [facility] Mobile: [phone]

## 2019-12-28 ENCOUNTER — Ambulatory Visit: Payer: Medicare Other | Admitting: Physical Therapy

## 2019-12-29 ENCOUNTER — Telehealth: Payer: Self-pay | Admitting: General Practice

## 2019-12-29 ENCOUNTER — Telehealth: Payer: Medicare Other

## 2019-12-29 NOTE — Telephone Encounter (Signed)
  Chronic Care Management   Outreach Note  12/29/2019 Name: Justin Griffith MRN: [id number] DOB: Nov 03, 1940  Referred by: [name], NP Reason for referral : Chronic Care Management (RNCM: Emmit Red for post hospitalization, 5 flags, Chronic Disease Management and Care Coordination Needs)   A second unsuccessful telephone outreach was attempted today. The patient was referred to the case management team for assistance with care management and care coordination.   Follow Up Plan: A HIPAA compliant phone message was left for the patient providing contact information and requesting a return call.   Noreene Larsson RN, MSN, [facility] Mobile: [phone]

## 2020-01-01 ENCOUNTER — Encounter: Payer: Self-pay | Admitting: Nurse Practitioner

## 2020-01-01 ENCOUNTER — Ambulatory Visit (INDEPENDENT_AMBULATORY_CARE_PROVIDER_SITE_OTHER): Payer: Medicare Other | Admitting: Nurse Practitioner

## 2020-01-01 ENCOUNTER — Other Ambulatory Visit: Payer: Self-pay

## 2020-01-01 VITALS — BP 138/82 | HR 68 | Temp 98.8°F | Ht 71.0 in | Wt 293.8 lb

## 2020-01-01 DIAGNOSIS — R809 Proteinuria, unspecified: Secondary | ICD-10-CM | POA: Diagnosis not present

## 2020-01-01 DIAGNOSIS — I152 Hypertension secondary to endocrine disorders: Secondary | ICD-10-CM

## 2020-01-01 DIAGNOSIS — E1159 Type 2 diabetes mellitus with other circulatory complications: Secondary | ICD-10-CM | POA: Diagnosis not present

## 2020-01-01 DIAGNOSIS — J449 Chronic obstructive pulmonary disease, unspecified: Secondary | ICD-10-CM | POA: Diagnosis not present

## 2020-01-01 DIAGNOSIS — G9341 Metabolic encephalopathy: Secondary | ICD-10-CM

## 2020-01-01 DIAGNOSIS — E1129 Type 2 diabetes mellitus with other diabetic kidney complication: Secondary | ICD-10-CM

## 2020-01-01 DIAGNOSIS — I639 Cerebral infarction, unspecified: Secondary | ICD-10-CM

## 2020-01-01 DIAGNOSIS — E785 Hyperlipidemia, unspecified: Secondary | ICD-10-CM

## 2020-01-01 DIAGNOSIS — E1169 Type 2 diabetes mellitus with other specified complication: Secondary | ICD-10-CM

## 2020-01-01 DIAGNOSIS — Z6841 Body Mass Index (BMI) 40.0 and over, adult: Secondary | ICD-10-CM

## 2020-01-01 DIAGNOSIS — R413 Other amnesia: Secondary | ICD-10-CM | POA: Insufficient documentation

## 2020-01-01 MED ORDER — DONEPEZIL HCL 5 MG PO TABS: 5.0000 mg | ORAL_TABLET | Freq: Every day | ORAL | 4 refills | Status: DC

## 2020-01-01 NOTE — Assessment & Plan Note (Signed)
BMI 40.98 with T2DM and HTN.  Recommended eating smaller high protein, low fat meals more frequently and exercising 30 mins a day 5 times a week with a goal of 10-15lb weight loss in the next 3 months. Patient voiced their understanding and motivation to adhere to these recommendations.

## 2020-01-01 NOTE — Assessment & Plan Note (Signed)
Refer to morbid obesity plan, continue focus on healthy diet and exercise regimen. °

## 2020-01-01 NOTE — Assessment & Plan Note (Signed)
6CIT today -- 12.  Noted short term memory changes, unable to recall address provided or year today.  Often repeat stories that have already been shared.  He is followed by neurology, recent scan in hospital did note acute punctate infarcts, which neuro recommended he increase ASA to 325 MG daily, he reports he is taking this.  Continue to collaborate with neuro.  Will start Aricept 5 MG daily to assess if benefit, suspect memory changes may be more vascular and discussed with him need for good BP control.  Recommend he continue to visit with neurology and bring medications to each visit.  Return in 3 months for visit with new physician in office.

## 2020-01-01 NOTE — Progress Notes (Signed)
BP 138/82   Pulse 68   Temp 98.8 F (37.1 C) (Oral)   Ht 5\' 11"  (1.803 m)   Wt 293 lb 12.8 oz (133.3 kg)   SpO2 94%   BMI 40.98 kg/m    Subjective:    Patient ID: Justin Griffith, male    DOB: November 24, 1940, 79 y.o.   MRN: [id number]  HPI: [name] is a 79 y.o. male  Chief Complaint  Patient presents with  . Hospitalization Follow-up    Transition of Care Hospital Follow up.   Patient is a poor historian at baseline, did not bring medications in to visit today as instructed in past, and is unsure of full regimen.  He reports wife is unable to help as she has dementia.  Currently doing PT/OT at home -- he reports they came by Tuesday or Thursday last week.  He is followed by neurology and was not aware of need for 1-2 week follow-up with them.  Recommendations for Outpatient Follow-up:  1. Follow up with PCP in 1-2 weeks 2. Follow up with Cardiology within 1-2 weeks 3. Follow up with Neurology [name] within 1-2 weeks 4. Please obtain CMP/CBC, Mag, Phos in one week 5. Please follow up on the following pending results  "Brief/Interim Summary: HPI per Dr. Ivor Costa on 12/14/19 [name] a 78 y.o.malewith medical history significant ofhypertension, hyperlipidemia, diabetes mellitus, COPD, asthma, OSA, former smoker, BPH, colon cancer, PAD, who presents with altered mental status, fall, seizure-like activity.  Patient has AMS,andis unable to provide accurate medical history. I called his daughter who alsodoes not know the detail information.Per report, patient was noted to be confused, complains of headache and had unwitnessed fall. EMS report, patient had 1 episode of seizure-like activities. Patient has mild cough, butdoes not have active respiratory distress, no active nausea,vomiting, diarrhea noted. Patient denies chest pain or shortness of breath. Not sure if patient has symptoms of UTI. No facial droop or slurred speech. He moves all extremities  normally. When I saw pt in ED, he is confused. Heknows his own name and knows that he is in hospital. He is confused about time.   ED Course:pt was found to have WBC 5.6, pending urinalysis, negative Covid PCR, troponin level 29, 27, lactic acid 1.2, INR 1.1, electrolytes renal function okay, temperature normal, blood pressure 221/100, 176, 93, heart rate 52, RR 23, oxygen saturation 92% on room air, which improved to100% on 2 L oxygen. CT of the head is negative for acute intracranial abnormalities. Chest x-ray showed atelectatic change left base with equivocal left pleural effusion.Pt isplaced on MedSurg [facility] for observation.  **Interim History  Echocardiogram was ordered given his syncopal episodeshowed an EF of 60 to 65% with indeterminate left diastolic parameters. Incidentally he was found to have an acute small punctate infarcts in the setting of small vessel disease. Neurology has no other recommendations but recommending continuing aspirin. EEG was normal and neurology recommending no antiepileptics but still needs echocardiogram done for syncope work-up and completion of stroke work-up. His mental status is improved but today he had a unwitnessed fall so we will order another head CT scanand it showed "No evidence of acute intracranial abnormality. Small right posterior scalp contusion without underlying fracture."Labs are stable and orthostatic vital signs watch him overnight again and obtain PT OT evaluation in the morning.   He is not orthostatic on repeat vital signs and actually his blood pressure was little bit higher so his IV fluid that  was started this morning was stopped.  He did complain of a headache prior to discharge and was given IV Decadron, IV magnesium and IV ketorolac with improvement.  He was also started Fioricet with no real improvement in his headache.  Patient was deemed stable to be discharged and follow-up with PCP as neurology has deemed the patient  stable for discharge from their perspective.  He will be going home on aspirin 325 mg p.o. daily and will need to follow-up with neurology at the [facility] clinic with [name] in 1 to 2 weeks. Patient was understandable and agreeable with plan of care and all questions were answered to satisfaction.  Acute CVA -Small and MRI showed punctate 3 mm acute ischemic nonhemorrhagic cortical infarct involving the right occipital lobe as well as a tiny 3 mm focus of diffusion abnormality involving the posterior left frontal cortex -Echocardiogram orderedan EF of 60 to 65% with no regional wall motion abnormalities but there was moderate left ventricular hypertrophy and left ventricular diastolic parameters were indeterminate. Right ventricular systolic function was normal and he did have an AV valve that was thickened and calcified with mildly restricted motion. Aortic valve regurgitation was not visualized and he had mild aortic valve stenosis. -Neurology following and he is currently on aspirin and he has had a lipid panel done as well as a hemoglobin A1c-PT OT evaluated and recommending home health and SLP signed offbut patient had a unwitnessed fall today so we will have PT OT reevaluate again and is still recommend home health -Neurology feels that this patient has stroke in the setting of a small vessel disease rather embolic in nature -Neurology recommended full dose aspirin 325 mg p.o. daily given that he was on 81 mg as an outpatient -Lipid panel done and showed a total cholesterol/HDL ratio 4.5, cholesterol level of 135, HDL cholesterol of 30, LDL of 60, triglycerides of 226, VLDL 45 -Continue with his home statin -Defer to neurology for further recommendations and they recommend follow-up in outpatient setting with [name] in 1 to 2 weeks"  Hospital/Facility: [facility] D/C Physician: Dr. Alfredia Ferguson D/C Date: 12/17/19  Records Requested: 01/01/20 Records Received: 01/01/20 Records Reviewed:  01/01/20  Diagnoses on Discharge: Acute metabolic encephalopathy  Date of interactive Contact within 48 hours of discharge:  Contact was through: phone  Date of 7 day or 14 day face-to-face visit:    within 14 days  Outpatient Encounter Medications as of 01/01/2020  Medication Sig Note  . aspirin EC 325 MG EC tablet Take 1 tablet (325 mg total) by mouth daily.   . carvedilol (COREG) 12.5 MG tablet Take 2 tablets (25 mg total) by mouth 2 (two) times daily with a meal.   . clobetasol cream (TEMOVATE) 7.56 % Apply 1 application topically 2 (two) times daily. Apply to bilateral lower legs.   . furosemide (LASIX) 20 MG tablet Take 1 tablet (20 mg total) by mouth daily.   . insulin glargine (LANTUS) 100 UNIT/ML injection Inject 0.5 mLs (50 Units total) into the skin daily.   Marland Kitchen ipratropium-albuterol (DUONEB) 0.5-2.5 (3) MG/3ML SOLN Take 3 mLs by nebulization every 6 (six) hours. 02/21/2019: Taking QID   . QVAR REDIHALER 40 MCG/ACT inhaler 1 puff 2 (two) times daily.  05/23/2019: Using PRN  . tamsulosin (FLOMAX) 0.4 MG CAPS capsule Take 0.4 mg by mouth daily. Patient restarted on his own due to trouble urinating   . vitamin B-12 (CYANOCOBALAMIN) 500 MCG tablet Take 1,000 mcg by mouth daily. Two at  night   . Vitamin D, Cholecalciferol, 25 MCG (1000 UT) CAPS Take 50 mcg by mouth daily. 2 a night   . acyclovir (ZOVIRAX) 400 MG tablet Take 400 mg by mouth 2 (two) times daily.   Marland Kitchen azelastine (ASTELIN) 0.1 % nasal spray Place 2 sprays into both nostrils 2 (two) times daily. (Patient not taking: Reported on 01/01/2020)   . donepezil (ARICEPT) 5 MG tablet Take 1 tablet (5 mg total) by mouth at bedtime.   . mupirocin ointment (BACTROBAN) 2 % Apply 1 application topically 2 (two) times daily. To open blisters bilateral legs.   . naproxen (NAPROSYN) 500 MG tablet Take 500 mg by mouth 2 (two) times daily with a meal. Arthritis 05/23/2019: PRN   . olmesartan (BENICAR) 40 MG tablet Take 1 tablet (40 mg total) by  mouth daily.   . primidone (MYSOLINE) 50 MG tablet Take 1 tablet (50 mg total) by mouth 2 (two) times daily.   . rosuvastatin (CRESTOR) 40 MG tablet Take 1 tablet (40 mg total) by mouth at bedtime. (Patient taking differently: Take 40 mg by mouth at bedtime. 20 mg once a day)   . [DISCONTINUED] gabapentin (NEURONTIN) 300 MG capsule Start out taking 1 capsule (300 MG) at bedtime by mouth and if tolerated may increase 2 capsules (600 MG) at bedtime. (Patient not taking: Reported on 01/01/2020)    No facility-administered encounter medications on file as of 01/01/2020.    Diagnostic Tests Reviewed/Disposition: reviewed on chart -- imaging and labs -- labs noted below  Consults: neurology and cardiology  Discharge Instructions: as noted above  Disease/illness Education: discussed at length today with patient  Home Health/Community Services Discussions/Referrals: receiving home health PT/OT  Establishment or re-establishment of referral orders for community resources: none  Discussion with other health care providers: reviewed notes  Assessment and Support of treatment regimen adherence: reviewed with patient  Appointments Coordinated with: reviewed with patient -- needs neuro and cardiology follow-up  Education for self-management, independent living, and ADLs: reviewed with patient  HYPERTENSION/HLD Continues onOlmesartan 40 MG, Carvedilol 12.5 MG BID,Lasix 20 MG,and ASA+ Crestor 20 MG daily.He is poor historian and is often unsure of regimen, have asked multiple times for him to bring medications to visit so we can review.Sees [name] for cardiology, saw last in 10/04/19 when BP was 128/82 and they reduced his Carvedilol to 12.5 MG BID due to lower BP and HR -- he reports continuing to take 25 MG BID. Last EF 12/16/19 was 60-65%. Hypertension status:uncontrolled Satisfied with current treatment?yes Duration of hypertension:chronic BP monitoring frequency:waiting on  new cuff BP range: BP medication side effects:no Medication compliance:good compliance Previous BP meds:Losartan, Clonidine, HCTZ, Carvedilol, Amlodipine, Lasix Aspirin:yes Recurrent headaches:no Visual changes:no Palpitations:no Dyspnea:At baseline Chest pain:no Lower extremity edema:mild, baseline Dizzy/lightheaded:no  COPD Followed by [name], last seen 02/22/2019. Appears Prednisone was added 5 MG daily and Tessalon.  He reports no worsening cough or SOB -- is at baseline.  He reports in hospital the provider told him that he was increasing his QVAR.  Has not been taking QVAR -- have discussed with him and reiterated need to use.  Has only been using nebulizer.   COPD status:stable Satisfied with current treatment?:yes Oxygen use:no Dyspnea frequency:at baseline Cough frequency:present, chronic Rescue inhaler frequency:Uses Duoneb QID Limitation of activity:at times Productive cough: none Last Spirometry:with pulmonary Pneumovax:Up to Date Influenza:Up to Date  DIABETES Last saw endocrinology July 2021 and A1C 8.3% -- in hospital on 12/15/19 was 7.5%.  Does report  he is working on weight loss and diet changes, eating less sweets and bread which are his favorites.  He stopped taking Novolog months ago as he feels this was causing swelling in legs, when he stopped taking swelling went away.  He reports ongoing left eye discomfort since his eye exam at [facility] months back after they shone a bright light in his eye.   Hypoglycemic episodes:no Polydipsia/polyuria: yes Visual disturbance: no Chest pain: no Paresthesias: no Glucose Monitoring: yes             Accucheck frequency: Daily             Fasting glucose: 160-180's             Post prandial:             Evening: 120 -130's             Before meals: Taking Insulin?: no             Long acting insulin: Lantus 50 units             Short acting insulin: Blood Pressure Monitoring:  daily Retinal Examination: Not up to Date Foot Exam: Up to Date Pneumovax: Up to Date Influenza: Up to Date Aspirin: yes  6CIT Screen 01/01/2020 08/22/2018 08/20/2017  What Year? 4 points 0 points 0 points  What month? 0 points 0 points 0 points  What time? 0 points 0 points 0 points  Count back from 20 0 points 0 points 0 points  Months in reverse 0 points 0 points 0 points  Repeat phrase 8 points 0 points 0 points  Total Score 12 0 0   Relevant past medical, surgical, family and social history reviewed and updated as indicated. Interim medical history since our last visit reviewed. Allergies and medications reviewed and updated.  Review of Systems  Constitutional: Negative for activity change, diaphoresis, fatigue and fever.  Respiratory: Positive for cough (baseline) and shortness of breath (baseline). Negative for chest tightness and wheezing.   Cardiovascular: Positive for leg swelling (baseline, improved). Negative for chest pain and palpitations.  Endocrine: Negative for cold intolerance, heat intolerance, polydipsia, polyphagia and polyuria.  Neurological: Negative for dizziness, tremors, syncope, weakness, light-headedness, numbness and headaches.  Psychiatric/Behavioral: Negative.     Per HPI unless specifically indicated above     Objective:    BP 138/82   Pulse 68   Temp 98.8 F (37.1 C) (Oral)   Ht 5\' 11"  (1.803 m)   Wt 293 lb 12.8 oz (133.3 kg)   SpO2 94%   BMI 40.98 kg/m   Wt Readings from Last 3 Encounters:  01/01/20 293 lb 12.8 oz (133.3 kg)  12/15/19 281 lb 8.4 oz (127.7 kg)  11/28/19 295 lb (133.8 kg)    Physical Exam Vitals and nursing note reviewed.  Constitutional:      General: He is awake. He is not in acute distress.    Appearance: He is well-developed. He is morbidly obese. He is not ill-appearing.  HENT:     Head: Normocephalic and atraumatic.     Right Ear: Hearing, tympanic membrane, ear canal and external ear normal. No drainage.      Left Ear: Hearing, tympanic membrane, ear canal and external ear normal. No drainage.     Mouth/Throat:     Pharynx: Uvula midline. No pharyngeal swelling, oropharyngeal exudate or posterior oropharyngeal erythema.  Eyes:     General: Lids are normal.  Right eye: No discharge.        Left eye: No discharge.     Conjunctiva/sclera: Conjunctivae normal.     Pupils: Pupils are equal, round, and reactive to light.  Neck:     Thyroid: No thyromegaly.     Vascular: No carotid bruit or JVD.  Cardiovascular:     Rate and Rhythm: Normal rate and regular rhythm.     Heart sounds: Normal heart sounds, S1 normal and S2 normal. No murmur heard.  No gallop.   Pulmonary:     Effort: Pulmonary effort is normal. No accessory muscle usage or respiratory distress.     Breath sounds: Decreased breath sounds present. No wheezing.     Comments: Decreased breath sounds throughout, no wheezing.  No SOB with talking or when walked from office door to exam room. Abdominal:     General: Bowel sounds are normal.     Palpations: Abdomen is soft. There is no hepatomegaly or splenomegaly.     Tenderness: There is no abdominal tenderness.  Musculoskeletal:        General: Normal range of motion.     Cervical back: Normal range of motion and neck supple.     Right lower leg: Edema (trace) present.     Left lower leg: Edema (trace) present.  Lymphadenopathy:     Cervical: No cervical adenopathy.  Skin:    General: Skin is warm and dry.     Capillary Refill: Capillary refill takes less than 2 seconds.     Comments: Rubor and xerosis bilateral lower extremity.  Neurological:     Mental Status: He is alert and oriented to person, place, and time.  Psychiatric:        Mood and Affect: Mood normal.        Behavior: Behavior normal. Behavior is cooperative.        Thought Content: Thought content normal.        Judgment: Judgment normal.     Results for orders placed or performed during the hospital  encounter of 12/14/19  Respiratory Panel by RT PCR (Flu A&B, Covid) - Nasopharyngeal Swab   Specimen: Nasopharyngeal Swab  Result Value Ref Range   SARS Coronavirus 2 by RT PCR NEGATIVE NEGATIVE   Influenza A by PCR NEGATIVE NEGATIVE   Influenza B by PCR NEGATIVE NEGATIVE  CBC with Differential/Platelet  Result Value Ref Range   WBC 5.6 4.0 - 10.5 K/uL   RBC 5.03 4.22 - 5.81 MIL/uL   Hemoglobin 15.8 13.0 - 17.0 g/dL   HCT 44.7 39 - 52 %   MCV 88.9 80.0 - 100.0 fL   MCH 31.4 26.0 - 34.0 pg   MCHC 35.3 30.0 - 36.0 g/dL   RDW 12.9 11.5 - 15.5 %   Platelets 200 150 - 400 K/uL   nRBC 0.0 0.0 - 0.2 %   Neutrophils Relative % 65 %   Neutro Abs 3.6 1.7 - 7.7 K/uL   Lymphocytes Relative 22 %   Lymphs Abs 1.3 0.7 - 4.0 K/uL   Monocytes Relative 9 %   Monocytes Absolute 0.5 0.1 - 1.0 K/uL   Eosinophils Relative 2 %   Eosinophils Absolute 0.1 0 - 0 K/uL   Basophils Relative 1 %   Basophils Absolute 0.0 0 - 0 K/uL   Immature Granulocytes 1 %   Abs Immature Granulocytes 0.03 0.00 - 0.07 K/uL  Comprehensive metabolic panel  Result Value Ref Range   Sodium 133 (L) 135 - 145  mmol/L   Potassium 3.9 3.5 - 5.1 mmol/L   Chloride 97 (L) 98 - 111 mmol/L   CO2 28 22 - 32 mmol/L   Glucose, Bld 201 (H) 70 - 99 mg/dL   BUN 13 8 - 23 mg/dL   Creatinine, Ser 0.89 0.61 - 1.24 mg/dL   Calcium 10.3 8.9 - 10.3 mg/dL   Total Protein 7.0 6.5 - 8.1 g/dL   Albumin 3.5 3.5 - 5.0 g/dL   AST 20 15 - 41 U/L   ALT 20 0 - 44 U/L   Alkaline Phosphatase 46 38 - 126 U/L   Total Bilirubin 1.2 0.3 - 1.2 mg/dL   GFR calc non Af Amer >60 >60 mL/min   GFR calc Af Amer >60 >60 mL/min   Anion gap 8 5 - 15  Blood gas, venous  Result Value Ref Range   pH, Ven 7.36 7.25 - 7.43   pCO2, Ven 55 44 - 60 mmHg   pO2, Ven 35.0 32 - 45 mmHg   Bicarbonate 31.1 (H) 20.0 - 28.0 mmol/L   Acid-Base Excess 4.1 (H) 0.0 - 2.0 mmol/L   O2 Saturation 64.5 %   Patient temperature 37.0    Collection site VENOUS    Sample type  VENOUS    Mechanical Rate VENOUS   Lactic acid, plasma  Result Value Ref Range   Lactic Acid, Venous 1.2 0.5 - 1.9 mmol/L  Protime-INR  Result Value Ref Range   Prothrombin Time 13.3 11.4 - 15.2 seconds   INR 1.1 0.8 - 1.2  Glucose, capillary  Result Value Ref Range   Glucose-Capillary 207 (H) 70 - 99 mg/dL  Urine Drug Screen, Qualitative ([facility] only)  Result Value Ref Range   Tricyclic, Ur Screen NONE DETECTED NONE DETECTED   Amphetamines, Ur Screen NONE DETECTED NONE DETECTED   MDMA (Ecstasy)Ur Screen NONE DETECTED NONE DETECTED   Cocaine Metabolite,Ur [place] NONE DETECTED NONE DETECTED   Opiate, Ur Screen NONE DETECTED NONE DETECTED   Phencyclidine (PCP) Ur S NONE DETECTED NONE DETECTED   Cannabinoid 50 Ng, Ur [place] NONE DETECTED NONE DETECTED   Barbiturates, Ur Screen NONE DETECTED NONE DETECTED   Benzodiazepine, Ur Scrn NONE DETECTED NONE DETECTED   Methadone Scn, Ur NONE DETECTED NONE DETECTED  Urinalysis, Complete w Microscopic  Result Value Ref Range   Color, Urine YELLOW (A) YELLOW   APPearance HAZY (A) CLEAR   Specific Gravity, Urine 1.018 1.005 - 1.030   pH 6.0 5.0 - 8.0   Glucose, UA 50 (A) NEGATIVE mg/dL   Hgb urine dipstick NEGATIVE NEGATIVE   Bilirubin Urine NEGATIVE NEGATIVE   Ketones, ur NEGATIVE NEGATIVE mg/dL   Protein, ur >=300 (A) NEGATIVE mg/dL   Nitrite NEGATIVE NEGATIVE   Leukocytes,Ua NEGATIVE NEGATIVE   RBC / HPF 0-5 0 - 5 RBC/hpf   WBC, UA 0-5 0 - 5 WBC/hpf   Bacteria, UA NONE SEEN NONE SEEN   Squamous Epithelial / LPF 0-5 0 - 5   Hyaline Casts, UA PRESENT   Glucose, capillary  Result Value Ref Range   Glucose-Capillary 170 (H) 70 - 99 mg/dL  Hemoglobin A1c  Result Value Ref Range   Hgb A1c MFr Bld 7.5 (H) 4.8 - 5.6 %   Mean Plasma Glucose 168.55 mg/dL  Lipid panel  Result Value Ref Range   Cholesterol 135 0 - 200 mg/dL   Triglycerides 226 (H) <150 mg/dL   HDL 30 (L) >40 mg/dL   Total CHOL/HDL Ratio 4.5 RATIO  VLDL 45 (H) 0 - 40 mg/dL   LDL  Cholesterol 60 0 - 99 mg/dL  CBC  Result Value Ref Range   WBC 7.1 4.0 - 10.5 K/uL   RBC 4.75 4.22 - 5.81 MIL/uL   Hemoglobin 14.8 13.0 - 17.0 g/dL   HCT 41.9 39 - 52 %   MCV 88.2 80.0 - 100.0 fL   MCH 31.2 26.0 - 34.0 pg   MCHC 35.3 30.0 - 36.0 g/dL   RDW 12.7 11.5 - 15.5 %   Platelets 182 150 - 400 K/uL   nRBC 0.0 0.0 - 0.2 %  Glucose, capillary  Result Value Ref Range   Glucose-Capillary 135 (H) 70 - 99 mg/dL  Comprehensive metabolic panel  Result Value Ref Range   Sodium 137 135 - 145 mmol/L   Potassium 3.6 3.5 - 5.1 mmol/L   Chloride 100 98 - 111 mmol/L   CO2 29 22 - 32 mmol/L   Glucose, Bld 139 (H) 70 - 99 mg/dL   BUN 15 8 - 23 mg/dL   Creatinine, Ser 1.02 0.61 - 1.24 mg/dL   Calcium 10.3 8.9 - 10.3 mg/dL   Total Protein 6.2 (L) 6.5 - 8.1 g/dL   Albumin 3.1 (L) 3.5 - 5.0 g/dL   AST 20 15 - 41 U/L   ALT 18 0 - 44 U/L   Alkaline Phosphatase 40 38 - 126 U/L   Total Bilirubin 1.3 (H) 0.3 - 1.2 mg/dL   GFR calc non Af Amer >60 >60 mL/min   GFR calc Af Amer >60 >60 mL/min   Anion gap 8 5 - 15  Magnesium  Result Value Ref Range   Magnesium 1.9 1.7 - 2.4 mg/dL  Phosphorus  Result Value Ref Range   Phosphorus 3.3 2.5 - 4.6 mg/dL  Glucose, capillary  Result Value Ref Range   Glucose-Capillary 144 (H) 70 - 99 mg/dL  Glucose, capillary  Result Value Ref Range   Glucose-Capillary 161 (H) 70 - 99 mg/dL  Fibrin derivatives D-Dimer ([facility] only)  Result Value Ref Range   Fibrin derivatives D-dimer ([facility]) 373.23 0.00 - 499.00 ng/mL (FEU)  Glucose, capillary  Result Value Ref Range   Glucose-Capillary 128 (H) 70 - 99 mg/dL  CBC with Differential/Platelet  Result Value Ref Range   WBC 6.6 4.0 - 10.5 K/uL   RBC 4.51 4.22 - 5.81 MIL/uL   Hemoglobin 14.3 13.0 - 17.0 g/dL   HCT 40.4 39 - 52 %   MCV 89.6 80.0 - 100.0 fL   MCH 31.7 26.0 - 34.0 pg   MCHC 35.4 30.0 - 36.0 g/dL   RDW 12.9 11.5 - 15.5 %   Platelets 185 150 - 400 K/uL   nRBC 0.0 0.0 - 0.2 %   Neutrophils  Relative % 55 %   Neutro Abs 3.6 1.7 - 7.7 K/uL   Lymphocytes Relative 31 %   Lymphs Abs 2.0 0.7 - 4.0 K/uL   Monocytes Relative 11 %   Monocytes Absolute 0.8 0.1 - 1.0 K/uL   Eosinophils Relative 2 %   Eosinophils Absolute 0.1 0 - 0 K/uL   Basophils Relative 1 %   Basophils Absolute 0.1 0 - 0 K/uL   Immature Granulocytes 0 %   Abs Immature Granulocytes 0.01 0.00 - 0.07 K/uL  Comprehensive metabolic panel  Result Value Ref Range   Sodium 138 135 - 145 mmol/L   Potassium 3.7 3.5 - 5.1 mmol/L   Chloride 101 98 - 111 mmol/L   CO2  29 22 - 32 mmol/L   Glucose, Bld 138 (H) 70 - 99 mg/dL   BUN 18 8 - 23 mg/dL   Creatinine, Ser 1.09 0.61 - 1.24 mg/dL   Calcium 10.3 8.9 - 10.3 mg/dL   Total Protein 6.1 (L) 6.5 - 8.1 g/dL   Albumin 3.1 (L) 3.5 - 5.0 g/dL   AST 14 (L) 15 - 41 U/L   ALT 17 0 - 44 U/L   Alkaline Phosphatase 46 38 - 126 U/L   Total Bilirubin 0.9 0.3 - 1.2 mg/dL   GFR calc non Af Amer >60 >60 mL/min   GFR calc Af Amer >60 >60 mL/min   Anion gap 8 5 - 15  Phosphorus  Result Value Ref Range   Phosphorus 3.4 2.5 - 4.6 mg/dL  Magnesium  Result Value Ref Range   Magnesium 2.0 1.7 - 2.4 mg/dL  Glucose, capillary  Result Value Ref Range   Glucose-Capillary 262 (H) 70 - 99 mg/dL   Comment 1 Notify RN   Glucose, capillary  Result Value Ref Range   Glucose-Capillary 147 (H) 70 - 99 mg/dL   Comment 1 Notify RN    Comment 2 Document in Chart   Glucose, capillary  Result Value Ref Range   Glucose-Capillary 213 (H) 70 - 99 mg/dL   Comment 1 Notify RN    Comment 2 Document in Chart   Glucose, capillary  Result Value Ref Range   Glucose-Capillary 141 (H) 70 - 99 mg/dL   Comment 1 Notify RN    Comment 2 Document in Chart   CBC with Differential/Platelet  Result Value Ref Range   WBC 6.4 4.0 - 10.5 K/uL   RBC 4.73 4.22 - 5.81 MIL/uL   Hemoglobin 14.8 13.0 - 17.0 g/dL   HCT 43.3 39 - 52 %   MCV 91.5 80.0 - 100.0 fL   MCH 31.3 26.0 - 34.0 pg   MCHC 34.2 30.0 - 36.0 g/dL    RDW 12.7 11.5 - 15.5 %   Platelets 197 150 - 400 K/uL   nRBC 0.0 0.0 - 0.2 %   Neutrophils Relative % 53 %   Neutro Abs 3.4 1.7 - 7.7 K/uL   Lymphocytes Relative 32 %   Lymphs Abs 2.1 0.7 - 4.0 K/uL   Monocytes Relative 10 %   Monocytes Absolute 0.6 0.1 - 1.0 K/uL   Eosinophils Relative 3 %   Eosinophils Absolute 0.2 0 - 0 K/uL   Basophils Relative 1 %   Basophils Absolute 0.1 0 - 0 K/uL   Immature Granulocytes 1 %   Abs Immature Granulocytes 0.03 0.00 - 0.07 K/uL  Comprehensive metabolic panel  Result Value Ref Range   Sodium 136 135 - 145 mmol/L   Potassium 3.7 3.5 - 5.1 mmol/L   Chloride 100 98 - 111 mmol/L   CO2 28 22 - 32 mmol/L   Glucose, Bld 147 (H) 70 - 99 mg/dL   BUN 19 8 - 23 mg/dL   Creatinine, Ser 0.92 0.61 - 1.24 mg/dL   Calcium 10.3 8.9 - 10.3 mg/dL   Total Protein 6.6 6.5 - 8.1 g/dL   Albumin 3.3 (L) 3.5 - 5.0 g/dL   AST 13 (L) 15 - 41 U/L   ALT 18 0 - 44 U/L   Alkaline Phosphatase 44 38 - 126 U/L   Total Bilirubin 0.9 0.3 - 1.2 mg/dL   GFR calc non Af Amer >60 >60 mL/min   GFR calc Af Amer >60 >60  mL/min   Anion gap 8 5 - 15  Phosphorus  Result Value Ref Range   Phosphorus 3.0 2.5 - 4.6 mg/dL  Magnesium  Result Value Ref Range   Magnesium 2.0 1.7 - 2.4 mg/dL  Glucose, capillary  Result Value Ref Range   Glucose-Capillary 177 (H) 70 - 99 mg/dL   Comment 1 Notify RN   Glucose, capillary  Result Value Ref Range   Glucose-Capillary 177 (H) 70 - 99 mg/dL  Glucose, capillary  Result Value Ref Range   Glucose-Capillary 171 (H) 70 - 99 mg/dL  Glucose, capillary  Result Value Ref Range   Glucose-Capillary 197 (H) 70 - 99 mg/dL  Glucose, capillary  Result Value Ref Range   Glucose-Capillary 148 (H) 70 - 99 mg/dL   Comment 1 Notify RN    Comment 2 Document in Chart   ECHOCARDIOGRAM COMPLETE  Result Value Ref Range   Weight 4,504.44 oz   Height 72 in   BP 187/74 mmHg   Ao pk vel 2.53 m/s   AV Area VTI 1.92 cm2   AR max vel 1.74 cm2   AV Mean  grad 14.3 mmHg   AV Peak grad 25.6 mmHg   S' Lateral 3.13 cm   AV Area mean vel 1.92 cm2   Area-P 1/2 3.30 cm2  Troponin I (High Sensitivity)  Result Value Ref Range   Troponin I (High Sensitivity) 29 (H) <18 ng/L  Troponin I (High Sensitivity)  Result Value Ref Range   Troponin I (High Sensitivity) 27 (H) <18 ng/L  Troponin I (High Sensitivity)  Result Value Ref Range   Troponin I (High Sensitivity) 7 <18 ng/L  Troponin I (High Sensitivity)  Result Value Ref Range   Troponin I (High Sensitivity) 40 (H) <18 ng/L      Assessment & Plan:   Problem List Items Addressed This Visit      Cardiovascular and Mediastinum   Hypertension associated with diabetes ([place])    Chronic, ongoing with BP elevated initial, but improved on recheck.  Has underlying circulation issues and have recommended he wear compression hose at home + decrease sodium intake.  Continue current medication regimen and adjust as needed + continue collaboration with cardiology and [place] PCP.  Recommend he bring all medications to his provider visits and document all BP and BS readings, which should bring with him -- as is poor historian and unsure what he is exactly taking daily at home.  Continue to check BP daily at home and document.  Return in 3 months for visit with new physician in office.  CMP and TSH today.      Relevant Orders   CBC with Differential/Platelet   Comprehensive metabolic panel   TSH   Magnesium   Phosphorus     Respiratory   COPD (chronic obstructive pulmonary disease) (HCC)    Chronic, ongoing.  Followed by pulmonary.  Continue current medication regimen as prescribed by them and adjust as needed -- discussed need to take Qvar BID as is ordered.  Continue Mucinex at home as needed.          Endocrine   Type 2 diabetes mellitus with proteinuria (HCC)    Chronic, ongoing with poor control.  Followed by endocrinology with recent A1C 7.5%, January urine ALB 150 and A:C >300, continue Olmesartan  for kidney protection.  Continue current collaboration and medication regimen as prescribed by them.  Recommend focus on modest weight loss and diet changes.  Review upcoming endocrinology notes.  Continue collaboration with CCM team to assist in ongoing education about diabetes.  Return in 3 months for visit with new physician in office.      Relevant Orders   Magnesium   Phosphorus   Hyperlipidemia associated with type 2 diabetes mellitus (HCC)    Chronic, ongoing.  Continue current medication regimen and adjust as needed.  Lipid panel today, recent LDL 31.      Relevant Orders   Lipid Panel w/o Chol/HDL Ratio     Nervous and Auditory   Acute metabolic encephalopathy - Primary    Acute and improved at this time.  Will recheck Mag, Phos, CMP, and CBC today.        Other   BMI 40.0-44.9, adult ([place])    Refer to morbid obesity plan, continue focus on healthy diet and exercise regimen.      Morbid obesity (HCC)    BMI 40.98 with T2DM and HTN.  Recommended eating smaller high protein, low fat meals more frequently and exercising 30 mins a day 5 times a week with a goal of 10-15lb weight loss in the next 3 months. Patient voiced their understanding and motivation to adhere to these recommendations.       Memory changes    6CIT today -- 12.  Noted short term memory changes, unable to recall address provided or year today.  Often repeat stories that have already been shared.  He is followed by neurology, recent scan in hospital did note acute punctate infarcts, which neuro recommended he increase ASA to 325 MG daily, he reports he is taking this.  Continue to collaborate with neuro.  Will start Aricept 5 MG daily to assess if benefit, suspect memory changes may be more vascular and discussed with him need for good BP control.  Recommend he continue to visit with neurology and bring medications to each visit.  Return in 3 months for visit with new physician in office.          Follow up  plan: Return in about 3 months (around 04/02/2020) for To Meet with new PCP and chronic disease visit.

## 2020-01-01 NOTE — Assessment & Plan Note (Signed)
Acute and improved at this time.  Will recheck Mag, Phos, CMP, and CBC today.

## 2020-01-01 NOTE — Assessment & Plan Note (Signed)
Chronic, ongoing with BP elevated initial, but improved on recheck.  Has underlying circulation issues and have recommended he wear compression hose at home + decrease sodium intake.  Continue current medication regimen and adjust as needed + continue collaboration with cardiology and [place] PCP.  Recommend he bring all medications to his provider visits and document all BP and BS readings, which should bring with him -- as is poor historian and unsure what he is exactly taking daily at home.  Continue to check BP daily at home and document.  Return in 3 months for visit with new physician in office.  CMP and TSH today.

## 2020-01-01 NOTE — Assessment & Plan Note (Signed)
Chronic, ongoing.  Continue current medication regimen and adjust as needed.  Lipid panel today, recent LDL 31. °

## 2020-01-01 NOTE — Assessment & Plan Note (Addendum)
Chronic, ongoing with poor control.  Followed by endocrinology with recent A1C 7.5%, January urine ALB 150 and A:C >300, continue Olmesartan for kidney protection.  Continue current collaboration and medication regimen as prescribed by them.  Recommend focus on modest weight loss and diet changes.  Review upcoming endocrinology notes.  Continue collaboration with CCM team to assist in ongoing education about diabetes.  Return in 3 months for visit with new physician in office.

## 2020-01-01 NOTE — Patient Instructions (Addendum)
1. Follow up with Cardiology within 1-2 weeks 2. Follow up with Neurology [name] within 1-2 weeks   Diabetes Mellitus and Nutrition, Adult When you have diabetes (diabetes mellitus), it is very important to have healthy eating habits because your blood sugar (glucose) levels are greatly affected by what you eat and drink. Eating healthy foods in the appropriate amounts, at about the same times every day, can help you:  Control your blood glucose.  Lower your risk of heart disease.  Improve your blood pressure.  Reach or maintain a healthy weight. Every person with diabetes is different, and each person has different needs for a meal plan. Your health care provider may recommend that you work with a diet and nutrition specialist (dietitian) to make a meal plan that is best for you. Your meal plan may vary depending on factors such as:  The calories you need.  The medicines you take.  Your weight.  Your blood glucose, blood pressure, and cholesterol levels.  Your activity level.  Other health conditions you have, such as heart or kidney disease. How do carbohydrates affect me? Carbohydrates, also called carbs, affect your blood glucose level more than any other type of food. Eating carbs naturally raises the amount of glucose in your blood. Carb counting is a method for keeping track of how many carbs you eat. Counting carbs is important to keep your blood glucose at a healthy level, especially if you use insulin or take certain oral diabetes medicines. It is important to know how many carbs you can safely have in each meal. This is different for every person. Your dietitian can help you calculate how many carbs you should have at each meal and for each snack. Foods that contain carbs include:  Bread, cereal, rice, pasta, and crackers.  Potatoes and corn.  Peas, beans, and lentils.  Milk and yogurt.  Fruit and juice.  Desserts, such as cakes, cookies, ice cream, and  candy. How does alcohol affect me? Alcohol can cause a sudden decrease in blood glucose (hypoglycemia), especially if you use insulin or take certain oral diabetes medicines. Hypoglycemia can be a life-threatening condition. Symptoms of hypoglycemia (sleepiness, dizziness, and confusion) are similar to symptoms of having too much alcohol. If your health care provider says that alcohol is safe for you, follow these guidelines:  Limit alcohol intake to no more than 1 drink per day for nonpregnant women and 2 drinks per day for men. One drink equals 12 oz of beer, 5 oz of wine, or 1 oz of hard liquor.  Do not drink on an empty stomach.  Keep yourself hydrated with water, diet soda, or unsweetened iced tea.  Keep in mind that regular soda, juice, and other mixers may contain a lot of sugar and must be counted as carbs. What are tips for following this plan?  Reading food labels  Start by checking the serving size on the "Nutrition Facts" label of packaged foods and drinks. The amount of calories, carbs, fats, and other nutrients listed on the label is based on one serving of the item. Many items contain more than one serving per package.  Check the total grams (g) of carbs in one serving. You can calculate the number of servings of carbs in one serving by dividing the total carbs by 15. For example, if a food has 30 g of total carbs, it would be equal to 2 servings of carbs.  Check the number of grams (g) of saturated and trans  fats in one serving. Choose foods that have low or no amount of these fats.  Check the number of milligrams (mg) of salt (sodium) in one serving. Most people should limit total sodium intake to less than 2,300 mg per day.  Always check the nutrition information of foods labeled as "low-fat" or "nonfat". These foods may be higher in added sugar or refined carbs and should be avoided.  Talk to your dietitian to identify your daily goals for nutrients listed on the  label. Shopping  Avoid buying canned, premade, or processed foods. These foods tend to be high in fat, sodium, and added sugar.  Shop around the outside edge of the grocery store. This includes fresh fruits and vegetables, bulk grains, fresh meats, and fresh dairy. Cooking  Use low-heat cooking methods, such as baking, instead of high-heat cooking methods like deep frying.  Cook using healthy oils, such as olive, canola, or sunflower oil.  Avoid cooking with butter, cream, or high-fat meats. Meal planning  Eat meals and snacks regularly, preferably at the same times every day. Avoid going long periods of time without eating.  Eat foods high in fiber, such as fresh fruits, vegetables, beans, and whole grains. Talk to your dietitian about how many servings of carbs you can eat at each meal.  Eat 4-6 ounces (oz) of lean protein each day, such as lean meat, chicken, fish, eggs, or tofu. One oz of lean protein is equal to: ? 1 oz of meat, chicken, or fish. ? 1 egg. ?  cup of tofu.  Eat some foods each day that contain healthy fats, such as avocado, nuts, seeds, and fish. Lifestyle  Check your blood glucose regularly.  Exercise regularly as told by your health care provider. This may include: ? 150 minutes of moderate-intensity or vigorous-intensity exercise each week. This could be brisk walking, biking, or water aerobics. ? Stretching and doing strength exercises, such as yoga or weightlifting, at least 2 times a week.  Take medicines as told by your health care provider.  Do not use any products that contain nicotine or tobacco, such as cigarettes and e-cigarettes. If you need help quitting, ask your health care provider.  Work with a Social worker or diabetes educator to identify strategies to manage stress and any emotional and social challenges. Questions to ask a health care provider  Do I need to meet with a diabetes educator?  Do I need to meet with a dietitian?  What  number can I call if I have questions?  When are the best times to check my blood glucose? Where to find more information:  American Diabetes Association: diabetes.org  Academy of Nutrition and Dietetics: www.eatright.[employer] of Diabetes and Digestive and Kidney Diseases (NIH): [employer] Summary  A healthy meal plan will help you control your blood glucose and maintain a healthy lifestyle.  Working with a diet and nutrition specialist (dietitian) can help you make a meal plan that is best for you.  Keep in mind that carbohydrates (carbs) and alcohol have immediate effects on your blood glucose levels. It is important to count carbs and to use alcohol carefully. This information is not intended to replace advice given to you by your health care provider. Make sure you discuss any questions you have with your health care provider. Document Revised: 02/19/2017 Document Reviewed: 04/13/2016 Elsevier Patient Education  2020 [employer].

## 2020-01-01 NOTE — Assessment & Plan Note (Signed)
Chronic, ongoing.  Followed by pulmonary.  Continue current medication regimen as prescribed by them and adjust as needed -- discussed need to take Qvar BID as is ordered.  Continue Mucinex at home as needed.   °

## 2020-01-02 ENCOUNTER — Ambulatory Visit: Payer: Medicare Other | Admitting: Physical Therapy

## 2020-01-02 LAB — LIPID PANEL W/O CHOL/HDL RATIO
Cholesterol, Total: 234 mg/dL — ABNORMAL HIGH (ref 100–199)
HDL: 32 mg/dL — ABNORMAL LOW (ref >39)
LDL Chol Calc (NIH): 139 mg/dL — ABNORMAL HIGH (ref 0–99)
Triglycerides: 346 mg/dL — ABNORMAL HIGH (ref 0–149)
VLDL Cholesterol Cal: 63 mg/dL — ABNORMAL HIGH (ref 5–40)

## 2020-01-02 LAB — CBC WITH DIFFERENTIAL/PLATELET
Basophils Absolute: 0.1 10*3/uL (ref 0.0–0.2)
Basos: 1 %
EOS (ABSOLUTE): 0.2 10*3/uL (ref 0.0–0.4)
Eos: 3 %
Hematocrit: 45.2 % (ref 37.5–51.0)
Hemoglobin: 15 g/dL (ref 13.0–17.7)
Immature Grans (Abs): 0 10*3/uL (ref 0.0–0.1)
Immature Granulocytes: 1 %
Lymphocytes Absolute: 1.3 10*3/uL (ref 0.7–3.1)
Lymphs: 22 %
MCH: 31.4 pg (ref 26.6–33.0)
MCHC: 33.2 g/dL (ref 31.5–35.7)
MCV: 95 fL (ref 79–97)
Monocytes Absolute: 0.6 10*3/uL (ref 0.1–0.9)
Monocytes: 10 %
Neutrophils Absolute: 3.7 10*3/uL (ref 1.4–7.0)
Neutrophils: 63 %
Platelets: 233 10*3/uL (ref 150–450)
RBC: 4.78 x10E6/uL (ref 4.14–5.80)
RDW: 12.9 % (ref 11.6–15.4)
WBC: 5.7 10*3/uL (ref 3.4–10.8)

## 2020-01-02 LAB — COMPREHENSIVE METABOLIC PANEL
ALT: 17 IU/L (ref 0–44)
AST: 14 IU/L (ref 0–40)
Albumin/Globulin Ratio: 1.4 (ref 1.2–2.2)
Albumin: 3.6 g/dL — ABNORMAL LOW (ref 3.7–4.7)
Alkaline Phosphatase: 74 IU/L (ref 44–121)
BUN/Creatinine Ratio: 10 (ref 10–24)
BUN: 9 mg/dL (ref 8–27)
Bilirubin Total: 0.3 mg/dL (ref 0.0–1.2)
CO2: 25 mmol/L (ref 20–29)
Calcium: 10.3 mg/dL — ABNORMAL HIGH (ref 8.6–10.2)
Chloride: 98 mmol/L (ref 96–106)
Creatinine, Ser: 0.9 mg/dL (ref 0.76–1.27)
GFR calc Af Amer: 93 mL/min/{1.73_m2} (ref >59)
GFR calc non Af Amer: 80 mL/min/{1.73_m2} (ref >59)
Globulin, Total: 2.5 g/dL (ref 1.5–4.5)
Glucose: 210 mg/dL — ABNORMAL HIGH (ref 65–99)
Potassium: 4.4 mmol/L (ref 3.5–5.2)
Sodium: 135 mmol/L (ref 134–144)
Total Protein: 6.1 g/dL (ref 6.0–8.5)

## 2020-01-02 LAB — PHOSPHORUS: Phosphorus: 2.8 mg/dL (ref 2.8–4.1)

## 2020-01-02 LAB — MAGNESIUM: Magnesium: 1.9 mg/dL (ref 1.6–2.3)

## 2020-01-02 LAB — TSH: TSH: 2.61 u[IU]/mL (ref 0.450–4.500)

## 2020-01-02 NOTE — Progress Notes (Signed)
Good afternoon, please let Justin Griffith know his labs have returned.  Overall they are stable with exception of cholesterol levels which are quite elevated.  Are you taking your Rosuvastatin, cholesterol medication, daily?  It is very important to do so, as lowering these levels helps to prevent stroke.  If ongoing elevations next visit we may need to adjust regimen.  You had at goal levels before, but these were more elevated, so please ensure you are taking Rosuvastatin daily.  Any questions?  Have a great day!!  Next time bring all medications to visit please. Keep being awesome!!  Thank you for allowing me to participate in your care. Kindest regards, Avannah Decker

## 2020-01-03 DIAGNOSIS — E785 Hyperlipidemia, unspecified: Secondary | ICD-10-CM | POA: Diagnosis not present

## 2020-01-03 DIAGNOSIS — I152 Hypertension secondary to endocrine disorders: Secondary | ICD-10-CM | POA: Diagnosis not present

## 2020-01-03 DIAGNOSIS — E1142 Type 2 diabetes mellitus with diabetic polyneuropathy: Secondary | ICD-10-CM | POA: Diagnosis not present

## 2020-01-03 DIAGNOSIS — E1159 Type 2 diabetes mellitus with other circulatory complications: Secondary | ICD-10-CM | POA: Diagnosis not present

## 2020-01-03 DIAGNOSIS — Z794 Long term (current) use of insulin: Secondary | ICD-10-CM | POA: Diagnosis not present

## 2020-01-03 DIAGNOSIS — E1169 Type 2 diabetes mellitus with other specified complication: Secondary | ICD-10-CM | POA: Diagnosis not present

## 2020-01-03 NOTE — Telephone Encounter (Signed)
Pt has been r/s for 03/08/2020

## 2020-01-04 ENCOUNTER — Ambulatory Visit: Payer: Medicare Other | Admitting: Physical Therapy

## 2020-01-05 DIAGNOSIS — I152 Hypertension secondary to endocrine disorders: Secondary | ICD-10-CM | POA: Diagnosis not present

## 2020-01-05 DIAGNOSIS — G9341 Metabolic encephalopathy: Secondary | ICD-10-CM | POA: Diagnosis not present

## 2020-01-05 DIAGNOSIS — E1169 Type 2 diabetes mellitus with other specified complication: Secondary | ICD-10-CM | POA: Diagnosis not present

## 2020-01-05 DIAGNOSIS — E1159 Type 2 diabetes mellitus with other circulatory complications: Secondary | ICD-10-CM | POA: Diagnosis not present

## 2020-01-05 DIAGNOSIS — J449 Chronic obstructive pulmonary disease, unspecified: Secondary | ICD-10-CM | POA: Diagnosis not present

## 2020-01-05 DIAGNOSIS — E785 Hyperlipidemia, unspecified: Secondary | ICD-10-CM | POA: Diagnosis not present

## 2020-01-06 DIAGNOSIS — E1169 Type 2 diabetes mellitus with other specified complication: Secondary | ICD-10-CM | POA: Diagnosis not present

## 2020-01-06 DIAGNOSIS — E785 Hyperlipidemia, unspecified: Secondary | ICD-10-CM | POA: Diagnosis not present

## 2020-01-06 DIAGNOSIS — I152 Hypertension secondary to endocrine disorders: Secondary | ICD-10-CM | POA: Diagnosis not present

## 2020-01-06 DIAGNOSIS — G9341 Metabolic encephalopathy: Secondary | ICD-10-CM | POA: Diagnosis not present

## 2020-01-06 DIAGNOSIS — J449 Chronic obstructive pulmonary disease, unspecified: Secondary | ICD-10-CM | POA: Diagnosis not present

## 2020-01-06 DIAGNOSIS — E1159 Type 2 diabetes mellitus with other circulatory complications: Secondary | ICD-10-CM | POA: Diagnosis not present

## 2020-01-08 ENCOUNTER — Telehealth: Payer: Self-pay | Admitting: Nurse Practitioner

## 2020-01-08 NOTE — Telephone Encounter (Signed)
Left detailed voice message approving orders. Medication list and last office note faxed.

## 2020-01-08 NOTE — Telephone Encounter (Signed)
OK for verbal.  

## 2020-01-08 NOTE — Telephone Encounter (Signed)
Home Health Verbal Orders - Caller/AgencyAshley Jacobs Number: [phone]  Requesting OT/PT/Skilled Nursing/Social Work/Speech Therapy:  They sarted seeing him for nursing visits on Saturday and needing updated med list and last progress note.   And a referral for social worker     AttnFax # [phone]  [place]

## 2020-01-09 ENCOUNTER — Ambulatory Visit: Payer: Medicare Other | Admitting: Physical Therapy

## 2020-01-10 DIAGNOSIS — G9341 Metabolic encephalopathy: Secondary | ICD-10-CM | POA: Diagnosis not present

## 2020-01-10 DIAGNOSIS — E1169 Type 2 diabetes mellitus with other specified complication: Secondary | ICD-10-CM | POA: Diagnosis not present

## 2020-01-10 DIAGNOSIS — E1159 Type 2 diabetes mellitus with other circulatory complications: Secondary | ICD-10-CM | POA: Diagnosis not present

## 2020-01-10 DIAGNOSIS — E785 Hyperlipidemia, unspecified: Secondary | ICD-10-CM | POA: Diagnosis not present

## 2020-01-10 DIAGNOSIS — J449 Chronic obstructive pulmonary disease, unspecified: Secondary | ICD-10-CM | POA: Diagnosis not present

## 2020-01-10 DIAGNOSIS — I152 Hypertension secondary to endocrine disorders: Secondary | ICD-10-CM | POA: Diagnosis not present

## 2020-01-11 ENCOUNTER — Ambulatory Visit: Payer: Medicare Other | Admitting: Physical Therapy

## 2020-01-11 ENCOUNTER — Ambulatory Visit: Payer: Self-pay | Admitting: *Deleted

## 2020-01-11 NOTE — Telephone Encounter (Signed)
Called pt scheduled him tomorrow at 9 with [facility]

## 2020-01-11 NOTE — Telephone Encounter (Signed)
Blood pressure 30 minutes ago 120/132 taken with his neighbors b/p cuff. No CP or weakness. Reported having a headache intermittently for the past 3 weeks related to his eyes. Has SOB with COPD that is the same as usual. Takes Benicar and Coreg as prescribed. Advised ED due to Diastolic very high plus HA. Refuses to go stating he was discharged just 2 weeks ago. Insisting on OV to review his b/p medications. This call is during lunch hours for office so I am routing encounter for the patient to be contacted.   Reason for Disposition . [4] Systolic BP  >= 098 OR Diastolic >= 119  AND [1] having NO cardiac or neurologic symptoms  Answer Assessment - Initial Assessment Questions 1. BLOOD PRESSURE: "What is the blood pressure?" "Did you take at least two measurements 5 minutes apart?"     120/132 now  2. ONSET: "When did you take your blood pressure?"     Took blood pressure med this morning 3. HOW: "How did you obtain the blood pressure?" (e.g., visiting nurse, automatic home BP monitor)     Neighbor took pressure 4. HISTORY: "Do you have a history of high blood pressure?"     yes 5. MEDICATIONS: "Are you taking any medications for blood pressure?" "Have you missed any doses recently?"     Yes 6. OTHER SYMPTOMS: "Do you have any symptoms?" (e.g., headache, chest pain, blurred vision, difficulty breathing, weakness)     Headache, no CP chance you are pregnant?" "When was your last menstrual perioGNA 7. PRECY: "Is there anyNd?"     na  Protocols used: BLOOD PRESSURE - HIGH-A-AH

## 2020-01-11 NOTE — Telephone Encounter (Signed)
Called pt and advised pt to to bring in all of his medicine per Jolene, pt states that he will

## 2020-01-11 NOTE — Telephone Encounter (Signed)
Please tell him to bring all medications with him tomorrow, I have requested this past visits but he has not.  Need these to see what he is taking as he is often not sure.

## 2020-01-12 ENCOUNTER — Telehealth: Payer: Self-pay | Admitting: Nurse Practitioner

## 2020-01-12 ENCOUNTER — Encounter: Payer: Self-pay | Admitting: Nurse Practitioner

## 2020-01-12 ENCOUNTER — Other Ambulatory Visit: Payer: Self-pay

## 2020-01-12 ENCOUNTER — Ambulatory Visit (INDEPENDENT_AMBULATORY_CARE_PROVIDER_SITE_OTHER): Payer: Medicare Other | Admitting: Nurse Practitioner

## 2020-01-12 ENCOUNTER — Ambulatory Visit: Payer: Self-pay | Admitting: *Deleted

## 2020-01-12 VITALS — BP 140/80 | HR 84 | Temp 98.4°F | Resp 16 | Wt 290.2 lb

## 2020-01-12 DIAGNOSIS — J449 Chronic obstructive pulmonary disease, unspecified: Secondary | ICD-10-CM | POA: Diagnosis not present

## 2020-01-12 DIAGNOSIS — I10 Essential (primary) hypertension: Secondary | ICD-10-CM | POA: Diagnosis not present

## 2020-01-12 DIAGNOSIS — Z6841 Body Mass Index (BMI) 40.0 and over, adult: Secondary | ICD-10-CM

## 2020-01-12 DIAGNOSIS — E1159 Type 2 diabetes mellitus with other circulatory complications: Secondary | ICD-10-CM

## 2020-01-12 DIAGNOSIS — R519 Headache, unspecified: Secondary | ICD-10-CM | POA: Diagnosis not present

## 2020-01-12 DIAGNOSIS — E785 Hyperlipidemia, unspecified: Secondary | ICD-10-CM | POA: Diagnosis not present

## 2020-01-12 DIAGNOSIS — I639 Cerebral infarction, unspecified: Secondary | ICD-10-CM

## 2020-01-12 DIAGNOSIS — G9341 Metabolic encephalopathy: Secondary | ICD-10-CM | POA: Diagnosis not present

## 2020-01-12 DIAGNOSIS — I152 Hypertension secondary to endocrine disorders: Secondary | ICD-10-CM | POA: Diagnosis not present

## 2020-01-12 DIAGNOSIS — Z8673 Personal history of transient ischemic attack (TIA), and cerebral infarction without residual deficits: Secondary | ICD-10-CM | POA: Diagnosis not present

## 2020-01-12 DIAGNOSIS — E1169 Type 2 diabetes mellitus with other specified complication: Secondary | ICD-10-CM | POA: Diagnosis not present

## 2020-01-12 MED ORDER — CARVEDILOL 12.5 MG PO TABS: 25.0000 mg | ORAL_TABLET | Freq: Two times a day (BID) | ORAL | 4 refills | Status: DC

## 2020-01-12 MED ORDER — OLMESARTAN MEDOXOMIL 40 MG PO TABS
40.0000 mg | ORAL_TABLET | Freq: Every day | ORAL | 4 refills | Status: DC
Start: 2020-01-12 — End: 2020-10-21

## 2020-01-12 NOTE — Telephone Encounter (Signed)
Justin Griffith, N.P. at [facility], calling. Pt. Is in the office with elevated BP. Wishes to speak with provider at  [facility] for order for head CT. Warm transfer to [place] in the practice.

## 2020-01-12 NOTE — Progress Notes (Addendum)
BP 140/80   Pulse 84   Temp 98.4 F (36.9 C) (Oral)   Resp 16   Wt 290 lb 3.2 oz (131.6 kg)   SpO2 94%   BMI 40.47 kg/m    Subjective:    Patient ID: CHIP Justin Griffith, male    DOB: 1940-04-15, 79 y.o.   MRN: [id number]  HPI: Justin Griffith is a 79 y.o. male  Chief Complaint  Patient presents with  . Hypertension   HYPERTENSION Reports he went to his neighbors house yesterday and blood pressure was 120/132 on his neighbor's cuff.  He does not know if he trusts this.  Recent history of acute CVA 12/14/19, was in hospital and MRI noted showed punctate 3 mm acute ischemic nonhemorrhagic cortical infarct involving the right occipital lobe as well as a tiny 3 mm focus of diffusion abnormality involving the posterior left frontal cortex.  His recent A1C with endocrinology was 7.5%, saw them 01/03/20.     Saw [name] with cardiology last 10/04/19 -- is to return in 4 months to see him -- November.  BP 128/82 at that visit.  His Carvedilol was changed to 25 MG BID at that visit.  At recent hospitalization they stopped his Olmesartan-HCTZ -- is to be taking: - ASA - Carvedilol 25 MG BID -- has 12.5 MG tablets and is to take 2 tablets twice a day -- he is not sure he has been doing this - Furosemide 20 MG daily (was 40 MG) - Olmesartan 40 MG daily  In his meds he brought today he has Losartan and Clonidine he had been taking + the above medications -- although some of the above medications (Carvedilol and Olmesartan are out -- and he thinks he has been taking Carvedilol 12.5 MG twice daily and not two tablets (25 MG) twice daily as instructed).  He is a poor historian and a full medication review and education was provided today.  Bottles were written on today by PCP to show what he should be taking and what he was to stop. Hypertension status: stable  Satisfied with current treatment? yes Duration of hypertension: chronic BP monitoring frequency:  rarely BP range:  BP medication side  effects:  no Medication compliance: fair compliance Aspirin: yes Recurrent headaches: on and off since a recent [facility] Visual changes: no Palpitations: no Dyspnea: at baseline, no worse Chest pain: no Lower extremity edema: baseline, no worse Dizzy/lightheaded: no  Relevant past medical, surgical, family and social history reviewed and updated as indicated. Interim medical history since our last visit reviewed. Allergies and medications reviewed and updated.  Review of Systems  Constitutional: Negative for activity change, diaphoresis, fatigue and fever.  Respiratory: Positive for cough (baseline) and shortness of breath (baseline). Negative for chest tightness and wheezing.   Cardiovascular: Positive for leg swelling (baseline, improved). Negative for chest pain and palpitations.  Endocrine: Negative for cold intolerance, heat intolerance, polydipsia, polyphagia and polyuria.  Neurological: Negative for dizziness, tremors, syncope, weakness, light-headedness and numbness.  Psychiatric/Behavioral: Negative.     Per HPI unless specifically indicated above     Objective:    BP 140/80   Pulse 84   Temp 98.4 F (36.9 C) (Oral)   Resp 16   Wt 290 lb 3.2 oz (131.6 kg)   SpO2 94%   BMI 40.47 kg/m   Wt Readings from Last 3 Encounters:  01/12/20 290 lb 3.2 oz (131.6 kg)  01/01/20 293 lb 12.8 oz (133.3 kg)  12/15/19 281 lb 8.4 oz (127.7 kg)    Physical Exam Vitals and nursing note reviewed.  Constitutional:      General: He is awake. He is not in acute distress.    Appearance: He is well-developed. He is morbidly obese. He is not ill-appearing.  HENT:     Head: Normocephalic and atraumatic.     Right Ear: Hearing, tympanic membrane, ear canal and external ear normal. No drainage.     Left Ear: Hearing, tympanic membrane, ear canal and external ear normal. No drainage.     Mouth/Throat:     Pharynx: Uvula midline. No pharyngeal swelling, oropharyngeal exudate or  posterior oropharyngeal erythema.  Eyes:     General: Lids are normal.        Right eye: No discharge.        Left eye: No discharge.     Conjunctiva/sclera: Conjunctivae normal.     Pupils: Pupils are equal, round, and reactive to light.  Neck:     Thyroid: No thyromegaly.     Vascular: No carotid bruit or JVD.  Cardiovascular:     Rate and Rhythm: Normal rate and regular rhythm.     Heart sounds: Normal heart sounds, S1 normal and S2 normal. No murmur heard.  No gallop.   Pulmonary:     Effort: Pulmonary effort is normal. No accessory muscle usage or respiratory distress.     Breath sounds: Decreased breath sounds present. No wheezing.     Comments: Decreased breath sounds throughout, no wheezing.  No SOB with talking or when walked from office door to exam room. Abdominal:     General: Bowel sounds are normal.     Palpations: Abdomen is soft. There is no hepatomegaly or splenomegaly.     Tenderness: There is no abdominal tenderness.  Musculoskeletal:        General: Normal range of motion.     Cervical back: Normal range of motion and neck supple.     Right lower leg: Edema (trace) present.     Left lower leg: Edema (trace) present.  Lymphadenopathy:     Cervical: No cervical adenopathy.  Skin:    General: Skin is warm and dry.     Capillary Refill: Capillary refill takes less than 2 seconds.     Comments: Rubor and xerosis bilateral lower extremity.  Neurological:     Mental Status: He is alert and oriented to person, place, and time.  Psychiatric:        Mood and Affect: Mood normal.        Behavior: Behavior normal. Behavior is cooperative.        Thought Content: Thought content normal.        Judgment: Judgment normal.     Results for orders placed or performed in visit on 01/01/20  CBC with Differential/Platelet  Result Value Ref Range   WBC 5.7 3.4 - 10.8 x10E3/uL   RBC 4.78 4.14 - 5.80 x10E6/uL   Hemoglobin 15.0 13.0 - 17.7 g/dL   Hematocrit 45.2 37.5 -  51.0 %   MCV 95 79 - 97 fL   MCH 31.4 26.6 - 33.0 pg   MCHC 33.2 31 - 35 g/dL   RDW 12.9 11.6 - 15.4 %   Platelets 233 150 - 450 x10E3/uL   Neutrophils 63 Not Estab. %   Lymphs 22 Not Estab. %   Monocytes 10 Not Estab. %   Eos 3 Not Estab. %   Basos 1 Not Estab. %  Neutrophils Absolute 3.7 1.40 - 7.00 x10E3/uL   Lymphocytes Absolute 1.3 0 - 3 x10E3/uL   Monocytes Absolute 0.6 0 - 0 x10E3/uL   EOS (ABSOLUTE) 0.2 0.0 - 0.4 x10E3/uL   Basophils Absolute 0.1 0 - 0 x10E3/uL   Immature Granulocytes 1 Not Estab. %   Immature Grans (Abs) 0.0 0.0 - 0.1 x10E3/uL  Comprehensive metabolic panel  Result Value Ref Range   Glucose 210 (H) 65 - 99 mg/dL   BUN 9 8 - 27 mg/dL   Creatinine, Ser 0.90 0.76 - 1.27 mg/dL   GFR calc non Af Amer 80 >59 mL/min/1.73   GFR calc Af Amer 93 >59 mL/min/1.73   BUN/Creatinine Ratio 10 10 - 24   Sodium 135 134 - 144 mmol/L   Potassium 4.4 3.5 - 5.2 mmol/L   Chloride 98 96 - 106 mmol/L   CO2 25 20 - 29 mmol/L   Calcium 10.3 (H) 8.6 - 10.2 mg/dL   Total Protein 6.1 6.0 - 8.5 g/dL   Albumin 3.6 (L) 3.7 - 4.7 g/dL   Globulin, Total 2.5 1.5 - 4.5 g/dL   Albumin/Globulin Ratio 1.4 1.2 - 2.2   Bilirubin Total 0.3 0.0 - 1.2 mg/dL   Alkaline Phosphatase 74 44 - 121 IU/L   AST 14 0 - 40 IU/L   ALT 17 0 - 44 IU/L  TSH  Result Value Ref Range   TSH 2.610 0.450 - 4.500 uIU/mL  Lipid Panel w/o Chol/HDL Ratio  Result Value Ref Range   Cholesterol, Total 234 (H) 100 - 199 mg/dL   Triglycerides 346 (H) 0 - 149 mg/dL   HDL 32 (L) >39 mg/dL   VLDL Cholesterol Cal 63 (H) 5 - 40 mg/dL   LDL Chol Calc (NIH) 139 (H) 0 - 99 mg/dL  Magnesium  Result Value Ref Range   Magnesium 1.9 1.6 - 2.3 mg/dL  Phosphorus  Result Value Ref Range   Phosphorus 2.8 2.8 - 4.1 mg/dL      Assessment & Plan:   Problem List Items Addressed This Visit      Cardiovascular and Mediastinum   Hypertension associated with diabetes ([place]) - Primary    Chronic, ongoing with BP elevated  initial, but improved on recheck, although still above goal for stroke prevention.  Educated him on goal of <130/90.  Has underlying circulation issues and have recommended he wear compression hose at home + decrease sodium intake.  Continue current medication regimen, with exception of will return to 40 MG Furosemide at this time (he has three bottles of this from [name]) + continue collaboration with cardiology and [facility] PCP.  Recommend he bring all medications to his provider visits and document all BP and BS readings, which should bring with him -- as is poor historian and unsure what he is exactly taking daily at home.  Reviewed medications exensively with him today -- he sees multiple specialists and [facility] providers -- discussed with him that this can complicate matters and he should always bring medications with him to all visits. Return in 3 months for visit with new physician in office.  Sooner if worsening symptoms.      Relevant Medications   metFORMIN (GLUCOPHAGE) 1000 MG tablet   losartan (COZAAR) 100 MG tablet   cloNIDine (CATAPRES) 0.2 MG tablet   carvedilol (COREG) 12.5 MG tablet   olmesartan (BENICAR) 40 MG tablet     Other   BMI 40.0-44.9, adult (HCC)    Refer to morbid obesity plan, continue  focus on healthy diet and exercise regimen.      Relevant Medications   metFORMIN (GLUCOPHAGE) 1000 MG tablet   Morbid obesity (HCC)    BMI 40.47 with T2DM and HTN.  Recommended eating smaller high protein, low fat meals more frequently and exercising 30 mins a day 5 times a week with a goal of 10-15lb weight loss in the next 3 months. Patient voiced their understanding and motivation to adhere to these recommendations.       Relevant Medications   metFORMIN (GLUCOPHAGE) 1000 MG tablet       Follow up plan: Return for as scheduled.

## 2020-01-12 NOTE — Assessment & Plan Note (Signed)
BMI 40.47 with T2DM and HTN.  Recommended eating smaller high protein, low fat meals more frequently and exercising 30 mins a day 5 times a week with a goal of 10-15lb weight loss in the next 3 months. Patient voiced their understanding and motivation to adhere to these recommendations.

## 2020-01-12 NOTE — Assessment & Plan Note (Addendum)
Chronic, ongoing with BP elevated initial, but improved on recheck, although still above goal for stroke prevention.  Educated him on goal of <130/90.  Has underlying circulation issues and have recommended he wear compression hose at home + decrease sodium intake.  Continue current medication regimen, with exception of will return to 40 MG Furosemide at this time (he has three bottles of this from [name]) + continue collaboration with cardiology and [facility] PCP.  Recommend he bring all medications to his provider visits and document all BP and BS readings, which should bring with him -- as is poor historian and unsure what he is exactly taking daily at home.  Reviewed medications exensively with him today -- he sees multiple specialists and [facility] providers -- discussed with him that this can complicate matters and he should always bring medications with him to all visits. Return in 3 months for visit with new physician in office.  Sooner if worsening symptoms.

## 2020-01-12 NOTE — Telephone Encounter (Signed)
Spoke with Jolene advised of her message "Let her know in office today it was 140/80 and he has not been taking medications as instructed when I reviewed all medications today. I sent in refills on some of these. I would recommend if he is symptomatic then he needs to go to ER.". Spoke with Angie she states that pt will not go to hosp, advised [facility]

## 2020-01-12 NOTE — Telephone Encounter (Signed)
Neale Burly, RN from [employer] called stating the pt's BP was 278/117, HR 55, 02 sat 94% temp 98 orally at 1245 and  BP 200/101 at 1300; his reading were taken on his left upper arm he is disheveled and having shortness of breath; he is not sure what medications he took; she is concerned becauset there is a 17 hour wait in the ED; the pt was seen by Marnee Guarneri, [name] on 01/12/20; Angie was transferred to [facility] for further recommendations.  Reason for Disposition . [4] Systolic BP  >= 037 OR Diastolic >= 096 AND [4] cardiac or neurologic symptoms (e.g., chest pain, difficulty breathing, unsteady gait, blurred vision)  Answer Assessment - Initial Assessment Questions 1. BLOOD PRESSURE: "What is the blood pressure?" "Did you take at least two measurements 5 minutes apart?"     278/117 at 1245 and 200/101 at 1300 2. ONSET: "When did you take your blood pressure?"    1./22/21 3. HOW: "How did you obtain the blood pressure?" (e.g., visiting nurse, automatic home BP monitor)   Home health RN left upper arm 4. HISTORY: "Do you have a history of high blood pressure?"     yes 5. MEDICATIONS: "Are you taking any medications for blood pressure?" "Have you missed any doses recently?"     Not sure 6. OTHER SYMPTOMS: "Do you have any symptoms?" (e.g., headache, chest pain, blurred vision, difficulty breathing, weakness)   Pt appears disheveled 7. PREGNANCY: "Is there any chance you are pregnant?" "When was your last menstrual period?"     no  Protocols used: BLOOD PRESSURE - HIGH-A-AH

## 2020-01-12 NOTE — Patient Instructions (Addendum)
- ASA (aspirin) one tablet daily - Carvedilol 25 MG BID -- has 12.5 MG tablets and is to take 2 tablets twice a day - Furosemide 40 MG daily - Olmesartan 40 MG daily  Your goal blood pressure is <130/80   Hypertension, Adult High blood pressure (hypertension) is when the force of blood pumping through the arteries is too strong. The arteries are the blood vessels that carry blood from the heart throughout the body. Hypertension forces the heart to work harder to pump blood and may cause arteries to become narrow or stiff. Untreated or uncontrolled hypertension can cause a heart attack, heart failure, a stroke, kidney disease, and other problems. A blood pressure reading consists of a higher number over a lower number. Ideally, your blood pressure should be below 120/80. The first ("top") number is called the systolic pressure. It is a measure of the pressure in your arteries as your heart beats. The second ("bottom") number is called the diastolic pressure. It is a measure of the pressure in your arteries as the heart relaxes. What are the causes? The exact cause of this condition is not known. There are some conditions that result in or are related to high blood pressure. What increases the risk? Some risk factors for high blood pressure are under your control. The following factors may make you more likely to develop this condition:  Smoking.  Having type 2 diabetes mellitus, high cholesterol, or both.  Not getting enough exercise or physical activity.  Being overweight.  Having too much fat, sugar, calories, or salt (sodium) in your diet.  Drinking too much alcohol. Some risk factors for high blood pressure may be difficult or impossible to change. Some of these factors include:  Having chronic kidney disease.  Having a family history of high blood pressure.  Age. Risk increases with age.  Race. You may be at higher risk if you are African American.  Gender. Men are at higher  risk than women before age 29. After age 70, women are at higher risk than men.  Having obstructive sleep apnea.  Stress. What are the signs or symptoms? High blood pressure may not cause symptoms. Very high blood pressure (hypertensive crisis) may cause:  Headache.  Anxiety.  Shortness of breath.  Nosebleed.  Nausea and vomiting.  Vision changes.  Severe chest pain.  Seizures. How is this diagnosed? This condition is diagnosed by measuring your blood pressure while you are seated, with your arm resting on a flat surface, your legs uncrossed, and your feet flat on the floor. The cuff of the blood pressure monitor will be placed directly against the skin of your upper arm at the level of your heart. It should be measured at least twice using the same arm. Certain conditions can cause a difference in blood pressure between your right and left arms. Certain factors can cause blood pressure readings to be lower or higher than normal for a short period of time:  When your blood pressure is higher when you are in a health care provider's office than when you are at home, this is called white coat hypertension. Most people with this condition do not need medicines.  When your blood pressure is higher at home than when you are in a health care provider's office, this is called masked hypertension. Most people with this condition may need medicines to control blood pressure. If you have a high blood pressure reading during one visit or you have normal blood pressure with other  risk factors, you may be asked to:  Return on a different day to have your blood pressure checked again.  Monitor your blood pressure at home for 1 week or longer. If you are diagnosed with hypertension, you may have other blood or imaging tests to help your health care provider understand your overall risk for other conditions. How is this treated? This condition is treated by making healthy lifestyle changes, such  as eating healthy foods, exercising more, and reducing your alcohol intake. Your health care provider may prescribe medicine if lifestyle changes are not enough to get your blood pressure under control, and if:  Your systolic blood pressure is above 130.  Your diastolic blood pressure is above 80. Your personal target blood pressure may vary depending on your medical conditions, your age, and other factors. Follow these instructions at home: Eating and drinking   Eat a diet that is high in fiber and potassium, and low in sodium, added sugar, and fat. An example eating plan is called the DASH (Dietary Approaches to Stop Hypertension) diet. To eat this way: ? Eat plenty of fresh fruits and vegetables. Try to fill one half of your plate at each meal with fruits and vegetables. ? Eat whole grains, such as whole-wheat pasta, brown rice, or whole-grain bread. Fill about one fourth of your plate with whole grains. ? Eat or drink low-fat dairy products, such as skim milk or low-fat yogurt. ? Avoid fatty cuts of meat, processed or cured meats, and poultry with skin. Fill about one fourth of your plate with lean proteins, such as fish, chicken without skin, beans, eggs, or tofu. ? Avoid pre-made and processed foods. These tend to be higher in sodium, added sugar, and fat.  Reduce your daily sodium intake. Most people with hypertension should eat less than 1,500 mg of sodium a day.  Do not drink alcohol if: ? Your health care provider tells you not to drink. ? You are pregnant, may be pregnant, or are planning to become pregnant.  If you drink alcohol: ? Limit how much you use to:  0-1 drink a day for women.  0-2 drinks a day for men. ? Be aware of how much alcohol is in your drink. In the [place] one drink equals one 12 oz bottle of beer (355 mL), one 5 oz glass of wine (148 mL), or one 1 oz glass of hard liquor (44 mL). Lifestyle   Work with your health care provider to maintain a healthy body  weight or to lose weight. Ask what an ideal weight is for you.  Get at least 30 minutes of exercise most days of the week. Activities may include walking, swimming, or biking.  Include exercise to strengthen your muscles (resistance exercise), such as Pilates or lifting weights, as part of your weekly exercise routine. Try to do these types of exercises for 30 minutes at least 3 days a week.  Do not use any products that contain nicotine or tobacco, such as cigarettes, e-cigarettes, and chewing tobacco. If you need help quitting, ask your health care provider.  Monitor your blood pressure at home as told by your health care provider.  Keep all follow-up visits as told by your health care provider. This is important. Medicines  Take over-the-counter and prescription medicines only as told by your health care provider. Follow directions carefully. Blood pressure medicines must be taken as prescribed.  Do not skip doses of blood pressure medicine. Doing this puts you at risk for  problems and can make the medicine less effective.  Ask your health care provider about side effects or reactions to medicines that you should watch for. Contact a health care provider if you:  Think you are having a reaction to a medicine you are taking.  Have headaches that keep coming back (recurring).  Feel dizzy.  Have swelling in your ankles.  Have trouble with your vision. Get help right away if you:  Develop a severe headache or confusion.  Have unusual weakness or numbness.  Feel faint.  Have severe pain in your chest or abdomen.  Vomit repeatedly.  Have trouble breathing. Summary  Hypertension is when the force of blood pumping through your arteries is too strong. If this condition is not controlled, it may put you at risk for serious complications.  Your personal target blood pressure may vary depending on your medical conditions, your age, and other factors. For most people, a normal  blood pressure is less than 120/80.  Hypertension is treated with lifestyle changes, medicines, or a combination of both. Lifestyle changes include losing weight, eating a healthy, low-sodium diet, exercising more, and limiting alcohol. This information is not intended to replace advice given to you by your health care provider. Make sure you discuss any questions you have with your health care provider. Document Revised: 11/17/2017 Document Reviewed: 11/17/2017 Elsevier Patient Education  2020 [employer].

## 2020-01-12 NOTE — Telephone Encounter (Signed)
Spoke to Janett Billow, NP at [facility], at 1500 today.  Updated her on patient visit today and discussed that patient had been advised to go to ER, but he had refused.  Reviewed recent imaging from his recent hospitalization with her, including MRI which noted infarct.  She reports patient's BP is elevated this afternoon and he complains of a headache -- discussed need for patient to go to ER for further evaluation, if concerned about wait times could try [facility].

## 2020-01-12 NOTE — Telephone Encounter (Addendum)
Spoke to patient on phone, he has a new onset headache this afternoon.  He is headed to urgent care, as he has refused to go to ER due to nurse reporting he had 17 hour wait.  Have recommended ER visit since his BP is elevated at this time and had headache.  I have advised him to take all medications with him that I wrote on today, so they are aware of what his current regimen should be.

## 2020-01-12 NOTE — Assessment & Plan Note (Signed)
Refer to morbid obesity plan, continue focus on healthy diet and exercise regimen.

## 2020-01-15 DIAGNOSIS — J449 Chronic obstructive pulmonary disease, unspecified: Secondary | ICD-10-CM | POA: Diagnosis not present

## 2020-01-15 DIAGNOSIS — E785 Hyperlipidemia, unspecified: Secondary | ICD-10-CM | POA: Diagnosis not present

## 2020-01-15 DIAGNOSIS — G9341 Metabolic encephalopathy: Secondary | ICD-10-CM | POA: Diagnosis not present

## 2020-01-15 DIAGNOSIS — I152 Hypertension secondary to endocrine disorders: Secondary | ICD-10-CM | POA: Diagnosis not present

## 2020-01-15 DIAGNOSIS — E1169 Type 2 diabetes mellitus with other specified complication: Secondary | ICD-10-CM | POA: Diagnosis not present

## 2020-01-15 DIAGNOSIS — E1159 Type 2 diabetes mellitus with other circulatory complications: Secondary | ICD-10-CM | POA: Diagnosis not present

## 2020-01-16 ENCOUNTER — Ambulatory Visit: Payer: Medicare Other | Admitting: Physical Therapy

## 2020-01-16 DIAGNOSIS — G9341 Metabolic encephalopathy: Secondary | ICD-10-CM | POA: Diagnosis not present

## 2020-01-16 DIAGNOSIS — E1159 Type 2 diabetes mellitus with other circulatory complications: Secondary | ICD-10-CM | POA: Diagnosis not present

## 2020-01-16 DIAGNOSIS — I152 Hypertension secondary to endocrine disorders: Secondary | ICD-10-CM | POA: Diagnosis not present

## 2020-01-16 DIAGNOSIS — E785 Hyperlipidemia, unspecified: Secondary | ICD-10-CM | POA: Diagnosis not present

## 2020-01-16 DIAGNOSIS — E1169 Type 2 diabetes mellitus with other specified complication: Secondary | ICD-10-CM | POA: Diagnosis not present

## 2020-01-16 DIAGNOSIS — J449 Chronic obstructive pulmonary disease, unspecified: Secondary | ICD-10-CM | POA: Diagnosis not present

## 2020-01-18 ENCOUNTER — Ambulatory Visit: Payer: Medicare Other | Admitting: Physical Therapy

## 2020-01-18 DIAGNOSIS — J449 Chronic obstructive pulmonary disease, unspecified: Secondary | ICD-10-CM | POA: Diagnosis not present

## 2020-01-18 DIAGNOSIS — E1169 Type 2 diabetes mellitus with other specified complication: Secondary | ICD-10-CM | POA: Diagnosis not present

## 2020-01-18 DIAGNOSIS — E785 Hyperlipidemia, unspecified: Secondary | ICD-10-CM | POA: Diagnosis not present

## 2020-01-18 DIAGNOSIS — E1159 Type 2 diabetes mellitus with other circulatory complications: Secondary | ICD-10-CM | POA: Diagnosis not present

## 2020-01-18 DIAGNOSIS — G9341 Metabolic encephalopathy: Secondary | ICD-10-CM | POA: Diagnosis not present

## 2020-01-18 DIAGNOSIS — I152 Hypertension secondary to endocrine disorders: Secondary | ICD-10-CM | POA: Diagnosis not present

## 2020-01-19 DIAGNOSIS — Z6838 Body mass index (BMI) 38.0-38.9, adult: Secondary | ICD-10-CM | POA: Diagnosis not present

## 2020-01-19 DIAGNOSIS — J449 Chronic obstructive pulmonary disease, unspecified: Secondary | ICD-10-CM | POA: Diagnosis not present

## 2020-01-19 DIAGNOSIS — G9341 Metabolic encephalopathy: Secondary | ICD-10-CM | POA: Diagnosis not present

## 2020-01-19 DIAGNOSIS — Z8673 Personal history of transient ischemic attack (TIA), and cerebral infarction without residual deficits: Secondary | ICD-10-CM | POA: Diagnosis not present

## 2020-01-19 DIAGNOSIS — G4733 Obstructive sleep apnea (adult) (pediatric): Secondary | ICD-10-CM | POA: Diagnosis not present

## 2020-01-19 DIAGNOSIS — S0003XD Contusion of scalp, subsequent encounter: Secondary | ICD-10-CM | POA: Diagnosis not present

## 2020-01-19 DIAGNOSIS — I152 Hypertension secondary to endocrine disorders: Secondary | ICD-10-CM | POA: Diagnosis not present

## 2020-01-19 DIAGNOSIS — E1159 Type 2 diabetes mellitus with other circulatory complications: Secondary | ICD-10-CM | POA: Diagnosis not present

## 2020-01-19 DIAGNOSIS — Z791 Long term (current) use of non-steroidal anti-inflammatories (NSAID): Secondary | ICD-10-CM | POA: Diagnosis not present

## 2020-01-19 DIAGNOSIS — E785 Hyperlipidemia, unspecified: Secondary | ICD-10-CM | POA: Diagnosis not present

## 2020-01-19 DIAGNOSIS — Z85038 Personal history of other malignant neoplasm of large intestine: Secondary | ICD-10-CM | POA: Diagnosis not present

## 2020-01-19 DIAGNOSIS — Z794 Long term (current) use of insulin: Secondary | ICD-10-CM | POA: Diagnosis not present

## 2020-01-19 DIAGNOSIS — G250 Essential tremor: Secondary | ICD-10-CM | POA: Diagnosis not present

## 2020-01-19 DIAGNOSIS — E1169 Type 2 diabetes mellitus with other specified complication: Secondary | ICD-10-CM | POA: Diagnosis not present

## 2020-01-19 DIAGNOSIS — Z9181 History of falling: Secondary | ICD-10-CM | POA: Diagnosis not present

## 2020-01-19 DIAGNOSIS — E1151 Type 2 diabetes mellitus with diabetic peripheral angiopathy without gangrene: Secondary | ICD-10-CM | POA: Diagnosis not present

## 2020-01-19 DIAGNOSIS — N400 Enlarged prostate without lower urinary tract symptoms: Secondary | ICD-10-CM | POA: Diagnosis not present

## 2020-01-23 ENCOUNTER — Ambulatory Visit: Payer: Medicare Other | Admitting: Physical Therapy

## 2020-01-23 ENCOUNTER — Emergency Department: Payer: Medicare Other

## 2020-01-23 ENCOUNTER — Telehealth: Payer: Self-pay

## 2020-01-23 ENCOUNTER — Other Ambulatory Visit: Payer: Self-pay

## 2020-01-23 ENCOUNTER — Inpatient Hospital Stay
Admission: EM | Admit: 2020-01-23 | Discharge: 2020-01-29 | DRG: 070 | Disposition: A | Discharge status: Home / Self Care | Payer: Medicare Other | Attending: Family Medicine | Admitting: Family Medicine

## 2020-01-23 DIAGNOSIS — I2510 Atherosclerotic heart disease of native coronary artery without angina pectoris: Secondary | ICD-10-CM | POA: Diagnosis present

## 2020-01-23 DIAGNOSIS — E1129 Type 2 diabetes mellitus with other diabetic kidney complication: Secondary | ICD-10-CM | POA: Diagnosis present

## 2020-01-23 DIAGNOSIS — I152 Hypertension secondary to endocrine disorders: Secondary | ICD-10-CM | POA: Diagnosis not present

## 2020-01-23 DIAGNOSIS — R6510 Systemic inflammatory response syndrome (SIRS) of non-infectious origin without acute organ dysfunction: Secondary | ICD-10-CM | POA: Diagnosis present

## 2020-01-23 DIAGNOSIS — A419 Sepsis, unspecified organism: Secondary | ICD-10-CM | POA: Diagnosis not present

## 2020-01-23 DIAGNOSIS — E1169 Type 2 diabetes mellitus with other specified complication: Secondary | ICD-10-CM | POA: Diagnosis present

## 2020-01-23 DIAGNOSIS — I479 Paroxysmal tachycardia, unspecified: Secondary | ICD-10-CM | POA: Diagnosis not present

## 2020-01-23 DIAGNOSIS — N400 Enlarged prostate without lower urinary tract symptoms: Secondary | ICD-10-CM | POA: Diagnosis present

## 2020-01-23 DIAGNOSIS — R0682 Tachypnea, not elsewhere classified: Secondary | ICD-10-CM

## 2020-01-23 DIAGNOSIS — R404 Transient alteration of awareness: Secondary | ICD-10-CM | POA: Diagnosis not present

## 2020-01-23 DIAGNOSIS — Z85038 Personal history of other malignant neoplasm of large intestine: Secondary | ICD-10-CM

## 2020-01-23 DIAGNOSIS — Z7982 Long term (current) use of aspirin: Secondary | ICD-10-CM

## 2020-01-23 DIAGNOSIS — R3129 Other microscopic hematuria: Secondary | ICD-10-CM

## 2020-01-23 DIAGNOSIS — Z6841 Body Mass Index (BMI) 40.0 and over, adult: Secondary | ICD-10-CM | POA: Diagnosis not present

## 2020-01-23 DIAGNOSIS — K760 Fatty (change of) liver, not elsewhere classified: Secondary | ICD-10-CM | POA: Diagnosis present

## 2020-01-23 DIAGNOSIS — I200 Unstable angina: Secondary | ICD-10-CM | POA: Diagnosis not present

## 2020-01-23 DIAGNOSIS — G4733 Obstructive sleep apnea (adult) (pediatric): Secondary | ICD-10-CM | POA: Diagnosis present

## 2020-01-23 DIAGNOSIS — Z8673 Personal history of transient ischemic attack (TIA), and cerebral infarction without residual deficits: Secondary | ICD-10-CM | POA: Diagnosis not present

## 2020-01-23 DIAGNOSIS — N3289 Other specified disorders of bladder: Secondary | ICD-10-CM | POA: Diagnosis not present

## 2020-01-23 DIAGNOSIS — D72829 Elevated white blood cell count, unspecified: Secondary | ICD-10-CM

## 2020-01-23 DIAGNOSIS — R0602 Shortness of breath: Secondary | ICD-10-CM | POA: Diagnosis not present

## 2020-01-23 DIAGNOSIS — Y92009 Unspecified place in unspecified non-institutional (private) residence as the place of occurrence of the external cause: Secondary | ICD-10-CM

## 2020-01-23 DIAGNOSIS — E1159 Type 2 diabetes mellitus with other circulatory complications: Secondary | ICD-10-CM | POA: Diagnosis not present

## 2020-01-23 DIAGNOSIS — W19XXXA Unspecified fall, initial encounter: Secondary | ICD-10-CM | POA: Diagnosis present

## 2020-01-23 DIAGNOSIS — J9811 Atelectasis: Secondary | ICD-10-CM | POA: Diagnosis present

## 2020-01-23 DIAGNOSIS — Z87891 Personal history of nicotine dependence: Secondary | ICD-10-CM

## 2020-01-23 DIAGNOSIS — J449 Chronic obstructive pulmonary disease, unspecified: Secondary | ICD-10-CM | POA: Diagnosis present

## 2020-01-23 DIAGNOSIS — R011 Cardiac murmur, unspecified: Secondary | ICD-10-CM | POA: Diagnosis present

## 2020-01-23 DIAGNOSIS — J438 Other emphysema: Secondary | ICD-10-CM | POA: Diagnosis not present

## 2020-01-23 DIAGNOSIS — N401 Enlarged prostate with lower urinary tract symptoms: Secondary | ICD-10-CM | POA: Diagnosis not present

## 2020-01-23 DIAGNOSIS — G4710 Hypersomnia, unspecified: Secondary | ICD-10-CM

## 2020-01-23 DIAGNOSIS — Z800 Family history of malignant neoplasm of digestive organs: Secondary | ICD-10-CM

## 2020-01-23 DIAGNOSIS — G9341 Metabolic encephalopathy: Secondary | ICD-10-CM | POA: Diagnosis present

## 2020-01-23 DIAGNOSIS — I21A1 Myocardial infarction type 2: Secondary | ICD-10-CM | POA: Diagnosis present

## 2020-01-23 DIAGNOSIS — Z833 Family history of diabetes mellitus: Secondary | ICD-10-CM

## 2020-01-23 DIAGNOSIS — I214 Non-ST elevation (NSTEMI) myocardial infarction: Secondary | ICD-10-CM

## 2020-01-23 DIAGNOSIS — R000 Tachycardia, unspecified: Secondary | ICD-10-CM

## 2020-01-23 DIAGNOSIS — R310 Gross hematuria: Secondary | ICD-10-CM | POA: Diagnosis present

## 2020-01-23 DIAGNOSIS — Z96653 Presence of artificial knee joint, bilateral: Secondary | ICD-10-CM | POA: Diagnosis present

## 2020-01-23 DIAGNOSIS — Z20822 Contact with and (suspected) exposure to covid-19: Secondary | ICD-10-CM | POA: Diagnosis present

## 2020-01-23 DIAGNOSIS — E785 Hyperlipidemia, unspecified: Secondary | ICD-10-CM | POA: Diagnosis present

## 2020-01-23 DIAGNOSIS — G9340 Encephalopathy, unspecified: Secondary | ICD-10-CM | POA: Diagnosis not present

## 2020-01-23 DIAGNOSIS — Z820 Family history of epilepsy and other diseases of the nervous system: Secondary | ICD-10-CM

## 2020-01-23 DIAGNOSIS — Z888 Allergy status to other drugs, medicaments and biological substances status: Secondary | ICD-10-CM

## 2020-01-23 DIAGNOSIS — E1151 Type 2 diabetes mellitus with diabetic peripheral angiopathy without gangrene: Secondary | ICD-10-CM | POA: Diagnosis present

## 2020-01-23 DIAGNOSIS — R069 Unspecified abnormalities of breathing: Secondary | ICD-10-CM | POA: Diagnosis not present

## 2020-01-23 DIAGNOSIS — J9611 Chronic respiratory failure with hypoxia: Secondary | ICD-10-CM | POA: Diagnosis present

## 2020-01-23 DIAGNOSIS — N138 Other obstructive and reflux uropathy: Secondary | ICD-10-CM | POA: Diagnosis present

## 2020-01-23 DIAGNOSIS — R350 Frequency of micturition: Secondary | ICD-10-CM | POA: Diagnosis not present

## 2020-01-23 DIAGNOSIS — R509 Fever, unspecified: Secondary | ICD-10-CM | POA: Diagnosis not present

## 2020-01-23 DIAGNOSIS — R778 Other specified abnormalities of plasma proteins: Secondary | ICD-10-CM | POA: Diagnosis present

## 2020-01-23 DIAGNOSIS — M47816 Spondylosis without myelopathy or radiculopathy, lumbar region: Secondary | ICD-10-CM | POA: Diagnosis not present

## 2020-01-23 DIAGNOSIS — R809 Proteinuria, unspecified: Secondary | ICD-10-CM | POA: Diagnosis present

## 2020-01-23 DIAGNOSIS — Z794 Long term (current) use of insulin: Secondary | ICD-10-CM

## 2020-01-23 DIAGNOSIS — Z79899 Other long term (current) drug therapy: Secondary | ICD-10-CM

## 2020-01-23 DIAGNOSIS — R0689 Other abnormalities of breathing: Secondary | ICD-10-CM | POA: Diagnosis not present

## 2020-01-23 DIAGNOSIS — G4730 Sleep apnea, unspecified: Secondary | ICD-10-CM | POA: Diagnosis present

## 2020-01-23 DIAGNOSIS — J984 Other disorders of lung: Secondary | ICD-10-CM | POA: Diagnosis not present

## 2020-01-23 DIAGNOSIS — R4182 Altered mental status, unspecified: Secondary | ICD-10-CM | POA: Diagnosis not present

## 2020-01-23 DIAGNOSIS — Z9181 History of falling: Secondary | ICD-10-CM

## 2020-01-23 DIAGNOSIS — R413 Other amnesia: Secondary | ICD-10-CM

## 2020-01-23 DIAGNOSIS — R7989 Other specified abnormal findings of blood chemistry: Secondary | ICD-10-CM | POA: Diagnosis not present

## 2020-01-23 DIAGNOSIS — M47814 Spondylosis without myelopathy or radiculopathy, thoracic region: Secondary | ICD-10-CM | POA: Diagnosis not present

## 2020-01-23 DIAGNOSIS — D72819 Decreased white blood cell count, unspecified: Secondary | ICD-10-CM | POA: Diagnosis not present

## 2020-01-23 DIAGNOSIS — I25119 Atherosclerotic heart disease of native coronary artery with unspecified angina pectoris: Secondary | ICD-10-CM | POA: Diagnosis not present

## 2020-01-23 DIAGNOSIS — Z791 Long term (current) use of non-steroidal anti-inflammatories (NSAID): Secondary | ICD-10-CM

## 2020-01-23 LAB — COMPREHENSIVE METABOLIC PANEL
ALT: 17 U/L (ref 0–44)
AST: 31 U/L (ref 15–41)
Albumin: 3.4 g/dL — ABNORMAL LOW (ref 3.5–5.0)
Alkaline Phosphatase: 52 U/L (ref 38–126)
Anion gap: 14 (ref 5–15)
BUN: 20 mg/dL (ref 8–23)
CO2: 25 mmol/L (ref 22–32)
Calcium: 9.8 mg/dL (ref 8.9–10.3)
Chloride: 98 mmol/L (ref 98–111)
Creatinine, Ser: 1.06 mg/dL (ref 0.61–1.24)
GFR, Estimated: >60 mL/min (ref >60)
Glucose, Bld: 122 mg/dL — ABNORMAL HIGH (ref 70–99)
Potassium: 3.9 mmol/L (ref 3.5–5.1)
Sodium: 137 mmol/L (ref 135–145)
Total Bilirubin: 1.7 mg/dL — ABNORMAL HIGH (ref 0.3–1.2)
Total Protein: 7.1 g/dL (ref 6.5–8.1)

## 2020-01-23 LAB — URINALYSIS, COMPLETE (UACMP) WITH MICROSCOPIC
Bilirubin Urine: NEGATIVE
Glucose, UA: NEGATIVE mg/dL
Ketones, ur: NEGATIVE mg/dL
Leukocytes,Ua: NEGATIVE
Nitrite: NEGATIVE
Protein, ur: 100 mg/dL — AB
Specific Gravity, Urine: 1.008 (ref 1.005–1.030)
Squamous Epithelial / HPF: NONE SEEN (ref 0–5)
pH: 6 (ref 5.0–8.0)

## 2020-01-23 LAB — CBC WITH DIFFERENTIAL/PLATELET
Abs Immature Granulocytes: 0.1 10*3/uL — ABNORMAL HIGH (ref 0.00–0.07)
Basophils Absolute: 0.1 10*3/uL (ref 0.0–0.1)
Basophils Relative: 0 %
Eosinophils Absolute: 0 10*3/uL (ref 0.0–0.5)
Eosinophils Relative: 0 %
HCT: 45.3 % (ref 39.0–52.0)
Hemoglobin: 15.4 g/dL (ref 13.0–17.0)
Immature Granulocytes: 1 %
Lymphocytes Relative: 9 %
Lymphs Abs: 1.2 10*3/uL (ref 0.7–4.0)
MCH: 30.9 pg (ref 26.0–34.0)
MCHC: 34 g/dL (ref 30.0–36.0)
MCV: 91 fL (ref 80.0–100.0)
Monocytes Absolute: 0.7 10*3/uL (ref 0.1–1.0)
Monocytes Relative: 5 %
Neutro Abs: 11.7 10*3/uL — ABNORMAL HIGH (ref 1.7–7.7)
Neutrophils Relative %: 85 %
Platelets: 219 10*3/uL (ref 150–400)
RBC: 4.98 MIL/uL (ref 4.22–5.81)
RDW: 12.7 % (ref 11.5–15.5)
WBC: 13.7 10*3/uL — ABNORMAL HIGH (ref 4.0–10.5)
nRBC: 0 % (ref 0.0–0.2)

## 2020-01-23 LAB — MAGNESIUM: Magnesium: 1.6 mg/dL — ABNORMAL LOW (ref 1.7–2.4)

## 2020-01-23 LAB — CBG MONITORING, ED
Glucose-Capillary: 109 mg/dL — ABNORMAL HIGH (ref 70–99)
Glucose-Capillary: 188 mg/dL — ABNORMAL HIGH (ref 70–99)

## 2020-01-23 LAB — LACTIC ACID, PLASMA
Lactic Acid, Venous: 1.8 mmol/L (ref 0.5–1.9)
Lactic Acid, Venous: 1.8 mmol/L (ref 0.5–1.9)

## 2020-01-23 LAB — TROPONIN I (HIGH SENSITIVITY)
Troponin I (High Sensitivity): 387 ng/L (ref <18)
Troponin I (High Sensitivity): 355 ng/L (ref <18)

## 2020-01-23 LAB — PROTIME-INR
INR: 1.1 (ref 0.8–1.2)
Prothrombin Time: 14.1 seconds (ref 11.4–15.2)

## 2020-01-23 LAB — TSH: TSH: 1.332 u[IU]/mL (ref 0.350–4.500)

## 2020-01-23 LAB — RESPIRATORY PANEL BY RT PCR (FLU A&B, COVID)
Influenza A by PCR: NEGATIVE
Influenza B by PCR: NEGATIVE
SARS Coronavirus 2 by RT PCR: NEGATIVE

## 2020-01-23 LAB — BRAIN NATRIURETIC PEPTIDE: B Natriuretic Peptide: 76.3 pg/mL (ref 0.0–100.0)

## 2020-01-23 LAB — AMMONIA: Ammonia: 19 umol/L (ref 9–35)

## 2020-01-23 LAB — APTT: aPTT: 28 seconds (ref 24–36)

## 2020-01-23 MED ORDER — SENNOSIDES-DOCUSATE SODIUM 8.6-50 MG PO TABS: 1.0000 | ORAL_TABLET | Freq: Every evening | ORAL | Status: DC | PRN

## 2020-01-23 MED ORDER — HEPARIN BOLUS VIA INFUSION
4000.0000 [IU] | Freq: Once | INTRAVENOUS | Status: AC
  Administered 2020-01-23: 4000 [IU] via INTRAVENOUS
  Filled 2020-01-23: qty 4000

## 2020-01-23 MED ORDER — BUDESONIDE 0.25 MG/2ML IN SUSP
0.2500 mg | Freq: Two times a day (BID) | RESPIRATORY_TRACT | Status: DC
  Administered 2020-01-23 – 2020-01-29 (×12): 0.25 mg via RESPIRATORY_TRACT
  Filled 2020-01-23 (×13): qty 2

## 2020-01-23 MED ORDER — VANCOMYCIN HCL IN DEXTROSE 1-5 GM/200ML-% IV SOLN: 1000.0000 mg | Freq: Once | INTRAVENOUS | Status: DC

## 2020-01-23 MED ORDER — TAMSULOSIN HCL 0.4 MG PO CAPS
0.4000 mg | ORAL_CAPSULE | Freq: Every day | ORAL | Status: DC
  Administered 2020-01-23 – 2020-01-29 (×7): 0.4 mg via ORAL
  Filled 2020-01-23 (×7): qty 1

## 2020-01-23 MED ORDER — BECLOMETHASONE DIPROP HFA 40 MCG/ACT IN AERB: 1.0000 | INHALATION_SPRAY | Freq: Two times a day (BID) | RESPIRATORY_TRACT | Status: DC

## 2020-01-23 MED ORDER — IOHEXOL 300 MG/ML  SOLN
125.0000 mL | Freq: Once | INTRAMUSCULAR | Status: AC | PRN
  Administered 2020-01-23: 125 mL via INTRAVENOUS

## 2020-01-23 MED ORDER — HEPARIN (PORCINE) 25000 UT/250ML-% IV SOLN
1400.0000 [IU]/h | INTRAVENOUS | Status: DC
  Administered 2020-01-23: 1400 [IU]/h via INTRAVENOUS
  Filled 2020-01-23: qty 250

## 2020-01-23 MED ORDER — VANCOMYCIN HCL IN DEXTROSE 1-5 GM/200ML-% IV SOLN
1000.0000 mg | Freq: Once | INTRAVENOUS | Status: AC
  Administered 2020-01-23: 1000 mg via INTRAVENOUS
  Filled 2020-01-23: qty 200

## 2020-01-23 MED ORDER — INSULIN ASPART 100 UNIT/ML ~~LOC~~ SOLN
0.0000 [IU] | Freq: Three times a day (TID) | SUBCUTANEOUS | Status: DC
  Administered 2020-01-24: 2 [IU] via SUBCUTANEOUS
  Administered 2020-01-24: 3 [IU] via SUBCUTANEOUS
  Administered 2020-01-24 – 2020-01-25 (×2): 2 [IU] via SUBCUTANEOUS
  Administered 2020-01-25: 1 [IU] via SUBCUTANEOUS
  Administered 2020-01-25 – 2020-01-26 (×2): 2 [IU] via SUBCUTANEOUS
  Administered 2020-01-26: 3 [IU] via SUBCUTANEOUS
  Administered 2020-01-26: 1 [IU] via SUBCUTANEOUS
  Administered 2020-01-27 (×2): 2 [IU] via SUBCUTANEOUS
  Administered 2020-01-27: 3 [IU] via SUBCUTANEOUS
  Administered 2020-01-28 (×2): 2 [IU] via SUBCUTANEOUS
  Administered 2020-01-28 – 2020-01-29 (×2): 1 [IU] via SUBCUTANEOUS
  Administered 2020-01-29: 2 [IU] via SUBCUTANEOUS
  Filled 2020-01-23 (×17): qty 1

## 2020-01-23 MED ORDER — ASPIRIN 81 MG PO CHEW
324.0000 mg | CHEWABLE_TABLET | Freq: Every day | ORAL | Status: DC
  Administered 2020-01-24 – 2020-01-29 (×6): 324 mg via ORAL
  Filled 2020-01-23 (×6): qty 4

## 2020-01-23 MED ORDER — VANCOMYCIN HCL 1500 MG/300ML IV SOLN
1500.0000 mg | Freq: Once | INTRAVENOUS | Status: AC
  Administered 2020-01-23: 1500 mg via INTRAVENOUS
  Filled 2020-01-23: qty 300

## 2020-01-23 MED ORDER — ACETAMINOPHEN 325 MG PO TABS: 650.0000 mg | ORAL_TABLET | Freq: Four times a day (QID) | ORAL | Status: DC | PRN

## 2020-01-23 MED ORDER — SODIUM CHLORIDE 0.9 % IV SOLN
2.0000 g | Freq: Once | INTRAVENOUS | Status: AC
  Administered 2020-01-23: 2 g via INTRAVENOUS
  Filled 2020-01-23: qty 2

## 2020-01-23 MED ORDER — ONDANSETRON HCL 4 MG/2ML IJ SOLN: 4.0000 mg | Freq: Four times a day (QID) | INTRAMUSCULAR | Status: DC | PRN

## 2020-01-23 MED ORDER — ONDANSETRON HCL 4 MG PO TABS: 4.0000 mg | ORAL_TABLET | Freq: Four times a day (QID) | ORAL | Status: DC | PRN

## 2020-01-23 MED ORDER — METRONIDAZOLE IN NACL 5-0.79 MG/ML-% IV SOLN
500.0000 mg | Freq: Three times a day (TID) | INTRAVENOUS | Status: DC
  Administered 2020-01-23 – 2020-01-25 (×6): 500 mg via INTRAVENOUS
  Filled 2020-01-23 (×7): qty 100

## 2020-01-23 MED ORDER — DONEPEZIL HCL 5 MG PO TABS: 5.0000 mg | ORAL_TABLET | Freq: Every day | ORAL | Status: DC

## 2020-01-23 MED ORDER — LACTATED RINGERS IV BOLUS (SEPSIS)
1000.0000 mL | Freq: Once | INTRAVENOUS | Status: AC
  Administered 2020-01-23: 1000 mL via INTRAVENOUS

## 2020-01-23 MED ORDER — LACTATED RINGERS IV SOLN: INTRAVENOUS | Status: DC

## 2020-01-23 MED ORDER — CARVEDILOL 25 MG PO TABS
25.0000 mg | ORAL_TABLET | Freq: Two times a day (BID) | ORAL | Status: DC
  Administered 2020-01-23 – 2020-01-29 (×11): 25 mg via ORAL
  Filled 2020-01-23 (×5): qty 1
  Filled 2020-01-23 (×2): qty 2
  Filled 2020-01-23 (×2): qty 1
  Filled 2020-01-23: qty 2
  Filled 2020-01-23 (×2): qty 1

## 2020-01-23 MED ORDER — IRBESARTAN 150 MG PO TABS
300.0000 mg | ORAL_TABLET | Freq: Every day | ORAL | Status: DC
  Administered 2020-01-24 – 2020-01-29 (×6): 300 mg via ORAL
  Filled 2020-01-23 (×7): qty 2

## 2020-01-23 MED ORDER — CLONIDINE HCL 0.1 MG PO TABS: 0.2000 mg | ORAL_TABLET | Freq: Two times a day (BID) | ORAL | Status: DC

## 2020-01-23 MED ORDER — IPRATROPIUM-ALBUTEROL 0.5-2.5 (3) MG/3ML IN SOLN: 3.0000 mL | Freq: Four times a day (QID) | RESPIRATORY_TRACT | Status: DC | PRN

## 2020-01-23 MED ORDER — ASPIRIN 81 MG PO CHEW
324.0000 mg | CHEWABLE_TABLET | Freq: Once | ORAL | Status: AC
  Administered 2020-01-23: 324 mg via ORAL
  Filled 2020-01-23: qty 4

## 2020-01-23 MED ORDER — FLUTICASONE PROPIONATE HFA 44 MCG/ACT IN AERO: 2.0000 | INHALATION_SPRAY | Freq: Two times a day (BID) | RESPIRATORY_TRACT | Status: DC

## 2020-01-23 MED ORDER — VANCOMYCIN HCL IN DEXTROSE 1-5 GM/200ML-% IV SOLN
1000.0000 mg | Freq: Two times a day (BID) | INTRAVENOUS | Status: DC
  Administered 2020-01-24 (×2): 1000 mg via INTRAVENOUS
  Filled 2020-01-23 (×3): qty 200

## 2020-01-23 MED ORDER — SODIUM CHLORIDE 0.9 % IV SOLN
2.0000 g | Freq: Three times a day (TID) | INTRAVENOUS | Status: DC
  Administered 2020-01-23 – 2020-01-25 (×6): 2 g via INTRAVENOUS
  Filled 2020-01-23 (×9): qty 2

## 2020-01-23 MED ORDER — ALBUTEROL SULFATE HFA 108 (90 BASE) MCG/ACT IN AERS
1.0000 | INHALATION_SPRAY | Freq: Four times a day (QID) | RESPIRATORY_TRACT | Status: DC | PRN
  Filled 2020-01-23: qty 6.7

## 2020-01-23 MED ORDER — ROSUVASTATIN CALCIUM 10 MG PO TABS
40.0000 mg | ORAL_TABLET | Freq: Every day | ORAL | Status: DC
  Administered 2020-01-23 – 2020-01-28 (×6): 40 mg via ORAL
  Filled 2020-01-23 (×3): qty 4
  Filled 2020-01-23: qty 2
  Filled 2020-01-23 (×2): qty 4
  Filled 2020-01-23: qty 2

## 2020-01-23 MED ORDER — INSULIN GLARGINE 100 UNIT/ML ~~LOC~~ SOLN
25.0000 [IU] | Freq: Every day | SUBCUTANEOUS | Status: DC
  Administered 2020-01-23 – 2020-01-28 (×6): 25 [IU] via SUBCUTANEOUS
  Filled 2020-01-23 (×8): qty 0.25

## 2020-01-23 MED ORDER — SODIUM CHLORIDE 0.9% FLUSH
3.0000 mL | Freq: Two times a day (BID) | INTRAVENOUS | Status: DC
  Administered 2020-01-23 – 2020-01-29 (×11): 3 mL via INTRAVENOUS

## 2020-01-23 MED ORDER — ACETAMINOPHEN 650 MG RE SUPP: 650.0000 mg | Freq: Four times a day (QID) | RECTAL | Status: DC | PRN

## 2020-01-23 MED ORDER — LACTATED RINGERS IV SOLN: INTRAVENOUS | Status: AC

## 2020-01-23 MED ORDER — ACETAMINOPHEN 500 MG PO TABS
1000.0000 mg | ORAL_TABLET | Freq: Once | ORAL | Status: AC
  Administered 2020-01-23: 1000 mg via ORAL
  Filled 2020-01-23: qty 2

## 2020-01-23 MED ORDER — FUROSEMIDE 40 MG PO TABS: 20.0000 mg | ORAL_TABLET | Freq: Every day | ORAL | Status: DC

## 2020-01-23 MED ORDER — LACTATED RINGERS IV BOLUS (SEPSIS)
500.0000 mL | Freq: Once | INTRAVENOUS | Status: AC
  Administered 2020-01-23: 500 mL via INTRAVENOUS

## 2020-01-23 MED ORDER — MAGNESIUM SULFATE 2 GM/50ML IV SOLN
2.0000 g | Freq: Once | INTRAVENOUS | Status: AC
  Administered 2020-01-23: 2 g via INTRAVENOUS
  Filled 2020-01-23: qty 50

## 2020-01-23 NOTE — ED Provider Notes (Signed)
[facility] Emergency Department Provider Note  ____________________________________________   First MD Initiated Contact with Patient 01/23/20 1215     (approximate)  I have reviewed the triage vital signs and the nursing notes.   HISTORY  Chief Complaint Shortness of Breath   HPI Justin Griffith is a 79 y.o. male past medical history arthritis, asthma, COPD, emphysema, HTN, HDL, colon cancer, BPH, former tobacco abuse, and OSA on 3 L chronically although unclear why who presents via EMS from home for assessment of 3 4 days of worsening shortness of breath, substernal chest pain, and cough.  He has not been coughing up any blood.  History is very limited from the patient as he states "I just feel very tired and bad and I want you to fix me".  He does endorse cough and shortness of breath.  He states he feels nauseous but denies any recent vomiting, diarrhea, dysuria, denies headache, earache, sore throat, or rash.  States he fell yesterday but denies hitting anything when he fell including his head chest abdomen back or extremities.  States he fell because he was feeling very weak.         Past Medical History:  Diagnosis Date  . Arthritis   . Asthma 2000  . Back pain   . Colon polyp 2012  . COPD (chronic obstructive pulmonary disease) ([place])   . Diabetes mellitus without complication ([place])   . Emphysema of lung ([place])   . Heart murmur   . Hernia 2000  . Hyperlipidemia   . Hypertension   . Personal history of malignant neoplasm of large intestine   . Personal history of tobacco use, presenting hazards to health   . Sleep apnea   . Special screening for malignant neoplasms, colon     Patient Active Problem List   Diagnosis Date Noted  . Sepsis ([place]) 01/23/2020  . Coronary artery disease 01/23/2020  . History of CVA (cerebrovascular accident) 01/23/2020  . Memory changes 01/01/2020  . Acute metabolic encephalopathy 16/12/9602  . Elevated troponin  12/14/2019  . Pain in both lower extremities 07/14/2019  . Peripheral vascular disease with stasis dermatitis 05/23/2019  . B12 deficiency 04/23/2019  . Constipation 04/21/2019  . Tremor 11/14/2018  . Morbid obesity ([place]) 09/07/2017  . COPD (chronic obstructive pulmonary disease) ([place]) 04/29/2017  . Intertrigo 02/02/2017  . BPH (benign prostatic hyperplasia) 07/23/2016  . Advanced care planning/counseling discussion 07/23/2016  . Eosinophilic esophagitis 54/11/8117  . PAD (peripheral artery disease) ([place]) 01/08/2016  . Gastroesophageal reflux disease without esophagitis 01/06/2016  . Hyperlipidemia associated with type 2 diabetes mellitus ([place]) 01/16/2015  . Sleep apnea 01/16/2015  . BMI 40.0-44.9, adult ([place]) 01/16/2015  . Hypertension associated with diabetes ([place]) 10/11/2014  . Type 2 diabetes mellitus with proteinuria ([place]) 10/11/2014  . Back pain 10/11/2014  . History of colon cancer     Past Surgical History:  Procedure Laterality Date  . CARDIAC CATHETERIZATION Left 09/25/2015   Procedure: Left Heart Cath and Coronary Angiography;  Surgeon: Yolonda Kida, MD;  Location: [facility] CV LAB;  Service: Cardiovascular;  Laterality: Left;  . CHOLECYSTECTOMY  1970  . COLON SURGERY  07-16-99   sigmoid colon resection with primary anastomosis, chemotherapy for metastatic disease  . COLONOSCOPY  2001, 2012   Dr Bary Castilla, tubular adenoma of the cecum and ascending colon in 2012.  Marland Kitchen COLONOSCOPY WITH PROPOFOL N/A 02/12/2016   Procedure: COLONOSCOPY WITH PROPOFOL;  Surgeon: Robert Bellow, MD;  Location: [facility]  ENDOSCOPY;  Service: Endoscopy;  Laterality: N/A;  . ESOPHAGOGASTRODUODENOSCOPY (EGD) WITH PROPOFOL N/A 02/12/2016   Procedure: ESOPHAGOGASTRODUODENOSCOPY (EGD) WITH PROPOFOL;  Surgeon: Robert Bellow, MD;  Location: [facility] ENDOSCOPY;  Service: Endoscopy;  Laterality: N/A;  . HERNIA REPAIR Right    right inguinal hernia repair  . JOINT REPLACEMENT Bilateral    knee  replacement  . KNEE SURGERY Bilateral 2010  . MYRINGOTOMY WITH TUBE PLACEMENT Bilateral 08/16/2014   Procedure: MYRINGOTOMY WITH TUBE PLACEMENT;  Surgeon: Carloyn Manner, MD;  Location: [facility] ORS;  Service: ENT;  Laterality: Bilateral;  . PERIPHERAL VASCULAR CATHETERIZATION Right 09/04/2015   Procedure: Lower Extremity Angiography;  Surgeon: Katha Cabal, MD;  Location: [place] CV LAB;  Service: Cardiovascular;  Laterality: Right;  . PERIPHERAL VASCULAR CATHETERIZATION  09/04/2015   Procedure: Lower Extremity Intervention;  Surgeon: Katha Cabal, MD;  Location: [place] CV LAB;  Service: Cardiovascular;;  . TONSILLECTOMY    . TYMPANOSTOMY TUBE PLACEMENT      Prior to Admission medications   Medication Sig Start Date End Date Taking? Authorizing Provider  acyclovir (ZOVIRAX) 400 MG tablet Take 400 mg by mouth 2 (two) times daily.    [provider]  aspirin EC 325 MG EC tablet Take 1 tablet (325 mg total) by mouth daily. 12/18/19   Raiford Noble Latif, DO  azelastine (ASTELIN) 0.1 % nasal spray Place 2 sprays into both nostrils 2 (two) times daily.  11/22/19   [provider]  carvedilol (COREG) 12.5 MG tablet Take 2 tablets (25 mg total) by mouth 2 (two) times daily with a meal. 01/12/20   [name], Jolene T, NP  clobetasol cream (TEMOVATE) 2.80 % Apply 1 application topically 2 (two) times daily. Apply to bilateral lower legs. 05/23/19   [name], Henrine Screws T, NP  cloNIDine (CATAPRES) 0.2 MG tablet Take 0.2 mg by mouth 2 (two) times daily.    [provider]  donepezil (ARICEPT) 5 MG tablet Take 1 tablet (5 mg total) by mouth at bedtime. 01/01/20   [name], Henrine Screws T, NP  furosemide (LASIX) 20 MG tablet Take 1 tablet (20 mg total) by mouth daily. Patient taking differently: Take 40 mg by mouth daily.  12/17/19   Raiford Noble Latif, DO  gabapentin (NEURONTIN) 300 MG capsule Take 600 mg by mouth at bedtime.    [provider]  insulin glargine  (LANTUS) 100 UNIT/ML injection Inject 0.5 mLs (50 Units total) into the skin daily. 12/18/19   Sheikh, Omair Latif, DO  ipratropium-albuterol (DUONEB) 0.5-2.5 (3) MG/3ML SOLN Take 3 mLs by nebulization every 6 (six) hours.    [provider]  losartan (COZAAR) 100 MG tablet Take 100 mg by mouth daily.    [provider]  meloxicam (MOBIC) 7.5 MG tablet Take 7.5 mg by mouth daily.    [provider]  metFORMIN (GLUCOPHAGE) 1000 MG tablet Take 1,000 mg by mouth 2 (two) times daily with a meal.    [provider]  mupirocin ointment (BACTROBAN) 2 % Apply 1 application topically 2 (two) times daily. To open blisters bilateral legs. 07/29/19   [name], Henrine Screws T, NP  naproxen (NAPROSYN) 500 MG tablet Take 500 mg by mouth 2 (two) times daily with a meal. Arthritis    [provider]  olmesartan (BENICAR) 40 MG tablet Take 1 tablet (40 mg total) by mouth daily. 01/12/20   [name], Henrine Screws T, NP  primidone (MYSOLINE) 50 MG tablet Take 1 tablet (50 mg total) by mouth 2 (two) times  daily. 11/28/19   [name], Henrine Screws T, NP  QVAR REDIHALER 40 MCG/ACT inhaler 1 puff 2 (two) times daily.  02/22/19   [provider]  rosuvastatin (CRESTOR) 40 MG tablet Take 1 tablet (40 mg total) by mouth at bedtime. Patient taking differently: Take 40 mg by mouth at bedtime. 20 mg once a day 09/07/17   Guadalupe Maple, MD  tamsulosin (FLOMAX) 0.4 MG CAPS capsule Take 0.4 mg by mouth daily. Patient restarted on his own due to trouble urinating    [provider]  vitamin B-12 (CYANOCOBALAMIN) 500 MCG tablet Take 1,000 mcg by mouth daily. Two at night    [provider]  Vitamin D, Cholecalciferol, 25 MCG (1000 UT) CAPS Take 50 mcg by mouth daily. 2 a night    [provider]    Allergies Farxiga [dapagliflozin] and Jardiance [empagliflozin]  Family History  Problem Relation Age of Onset  . Diabetes Father   . Esophageal cancer Mother   . Alzheimer's  disease Paternal Uncle     Social History Social History   Tobacco Use  . Smoking status: Former Smoker    Packs/day: 1.00    Years: 30.00    Pack years: 30.00    Types: Cigarettes    Quit date: 03/23/1989    Years since quitting: 30.8  . Smokeless tobacco: Never Used  Vaping Use  . Vaping Use: Never used  Substance Use Topics  . Alcohol use: No    Alcohol/week: 0.0 standard drinks  . Drug use: No    Review of Systems  Review of Systems  Constitutional: Positive for chills, fever and malaise/fatigue.  HENT: Negative for sore throat.   Eyes: Negative for pain.  Respiratory: Positive for cough, sputum production and shortness of breath. Negative for stridor.   Cardiovascular: Negative for chest pain.  Gastrointestinal: Positive for nausea. Negative for vomiting.  Genitourinary: Positive for frequency and urgency. Negative for dysuria.  Musculoskeletal: Negative for myalgias.  Skin: Negative for rash.  Neurological: Negative for seizures, loss of consciousness and headaches.  Psychiatric/Behavioral: Negative for suicidal ideas.  All other systems reviewed and are negative.     ____________________________________________   PHYSICAL EXAM:  VITAL SIGNS: ED Triage Vitals  Enc Vitals Group     BP      Pulse      Resp      Temp      Temp src      SpO2      Weight      Height      Head Circumference      Peak Flow      Pain Score      Pain Loc      Pain Edu?      Excl. in [place]?    Vitals:   01/23/20 1730 01/23/20 1800  BP: (!) 159/98 (!) 166/111  Pulse: (!) 122 (!) 122  Resp: 10 14  Temp:    SpO2: 96% 94%   Physical Exam Vitals and nursing note reviewed.  Constitutional:      Appearance: He is well-developed. He is obese.  HENT:     Head: Normocephalic and atraumatic.     Right Ear: External ear normal.     Left Ear: External ear normal.     Nose: Nose normal.     Mouth/Throat:     Mouth: Mucous membranes are dry.  Eyes:     Conjunctiva/sclera:  Conjunctivae normal.  Cardiovascular:     Rate and  Rhythm: Regular rhythm. Tachycardia present.     Heart sounds: No murmur heard.   Pulmonary:     Effort: Pulmonary effort is normal. Tachypnea present. No respiratory distress.     Breath sounds: Normal breath sounds.  Abdominal:     Palpations: Abdomen is soft.     Tenderness: There is no abdominal tenderness.  Musculoskeletal:     Cervical back: Neck supple.     Right lower leg: Edema present.     Left lower leg: Edema present.  Skin:    General: Skin is warm and dry.     Capillary Refill: Capillary refill takes 2 to 3 seconds.  Neurological:     Mental Status: He is alert and oriented to person, place, and time. He is confused.  Psychiatric:        Mood and Affect: Mood normal.      ____________________________________________   LABS (all labs ordered are listed, but only abnormal results are displayed)  Labs Reviewed  URINALYSIS, COMPLETE (UACMP) WITH MICROSCOPIC - Abnormal; Notable for the following components:      Result Value   Color, Urine STRAW (*)    APPearance CLEAR (*)    Hgb urine dipstick LARGE (*)    Protein, ur 100 (*)    Bacteria, UA RARE (*)    All other components within normal limits  CBC WITH DIFFERENTIAL/PLATELET - Abnormal; Notable for the following components:   WBC 13.7 (*)    Neutro Abs 11.7 (*)    Abs Immature Granulocytes 0.10 (*)    All other components within normal limits  COMPREHENSIVE METABOLIC PANEL - Abnormal; Notable for the following components:   Glucose, Bld 122 (*)    Albumin 3.4 (*)    Total Bilirubin 1.7 (*)    All other components within normal limits  MAGNESIUM - Abnormal; Notable for the following components:   Magnesium 1.6 (*)    All other components within normal limits  BLOOD GAS, VENOUS - Abnormal; Notable for the following components:   Bicarbonate 32.0 (*)    Acid-Base Excess 4.5 (*)    All other components within normal limits  CBG MONITORING, ED - Abnormal;  Notable for the following components:   Glucose-Capillary 109 (*)    All other components within normal limits  TROPONIN I (HIGH SENSITIVITY) - Abnormal; Notable for the following components:   Troponin I (High Sensitivity) 387 (*)    All other components within normal limits  TROPONIN I (HIGH SENSITIVITY) - Abnormal; Notable for the following components:   Troponin I (High Sensitivity) 355 (*)    All other components within normal limits  RESPIRATORY PANEL BY RT PCR (FLU A&B, COVID)  CULTURE, BLOOD (ROUTINE X 2)  CULTURE, BLOOD (ROUTINE X 2)  URINE CULTURE  LACTIC ACID, PLASMA  BRAIN NATRIURETIC PEPTIDE  LACTIC ACID, PLASMA  PROTIME-INR  APTT  CBC WITH DIFFERENTIAL/PLATELET  HEPARIN LEVEL (UNFRACTIONATED)  CBC  TSH  AMMONIA   ____________________________________________  EKG  Sinus tachycardia with ventricular rate of 110, normal axis, unremarkable intervals, artifact in lead aVL and aVR and aVF without clear evidence of acute ischemia or other significant arrhythmia.   Repeat EKG with sinus tachycardia with a ventricular of 110, normal axis, unremarkable intervals, T wave inversion in lead I and some persistent artifact in aVR and aVL and aVF without other clear evidence of acute ischemia. ____________________________________________  RADIOLOGY  ED MD interpretation: Right basilar atelectasis and no clear focal consolidation, no thorax, effusion, edema, or other  acute intrathoracic process.  Official radiology report(s): CT CHEST ABDOMEN PELVIS W CONTRAST  Result Date: 01/23/2020 CLINICAL DATA:  Shortness of breath and fever EXAM: CT CHEST, ABDOMEN, AND PELVIS WITH CONTRAST TECHNIQUE: Multidetector CT imaging of the chest, abdomen and pelvis was performed following the standard protocol during bolus administration of intravenous contrast. CONTRAST:  140mL OMNIPAQUE IOHEXOL 300 MG/ML  SOLN COMPARISON:  Chest x-ray from earlier in the same day, CT from 08/05/2011. FINDINGS: CT  CHEST FINDINGS Cardiovascular: Thoracic aorta and its branches demonstrate atherosclerotic calcification. No aneurysmal dilatation or dissection is noted. No large central pulmonary embolus is noted. Cardiac shadow is within normal limits. Coronary calcifications are seen. Mediastinum/Nodes: Thoracic inlet is within normal limits. No sizable hilar or mediastinal adenopathy is noted. The esophagus as visualized is within normal limits. Lungs/Pleura: Mild emphysematous changes are seen. No focal confluent infiltrate is noted. Very mild basilar scarring is seen. 5 mm nodule is noted in the right lower lobe just above the hemidiaphragm stable in appearance from the prior exam. Small right middle lobe nodule is noted along the pleural margin anterior laterally also stable from the prior exam. No new parenchymal nodules are seen. Musculoskeletal: Degenerative changes of the thoracic spine are noted. No acute bony abnormality is seen. CT ABDOMEN PELVIS FINDINGS Hepatobiliary: Mild fatty infiltration of the liver is noted. An area of focal fatty sparing is noted along the falciform ligament. Gallbladder has been surgically. Pancreas: Pancreas is within normal limits. Spleen: Normal in size without focal abnormality. Adrenals/Urinary Tract: Adrenal glands are within normal limits bilaterally. Kidneys demonstrate a normal enhancement pattern bilaterally. Mild perinephric stranding is noted similar to that seen on prior exam. Delayed images demonstrate normal excretion of contrast material. The bladder is well distended. Stomach/Bowel: Postsurgical changes are noted in the colon. No obstructive or inflammatory changes are noted. The appendix is not well visualized although no inflammatory changes to suggest appendicitis are noted. The small bowel and stomach are within normal limits. Vascular/Lymphatic: Aortic atherosclerosis. No enlarged abdominal or pelvic lymph nodes. Reproductive: Prostate is prominent measuring 7.6 cm in  transverse dimension. Other: No abdominal wall hernia or abnormality. No abdominopelvic ascites. Musculoskeletal: Mild degenerative changes of the lumbar spine are noted.  DG Chest Port 1 View  Result Date: 01/23/2020 CLINICAL DATA:  Worsening shortness of breath and fever, fell yesterday, sepsis, lethargy, history asthma, COPD, diabetes mellitus, hypertension EXAM: PORTABLE CHEST 1 VIEW COMPARISON:  Portable exam 1237 hours compared to 12/14/2019 FINDINGS: Normal heart size, mediastinal contours, and pulmonary vascularity. Minimal RIGHT basilar atelectasis and LEFT basilar scarring unchanged. Lungs otherwise clear. No acute infiltrate, pleural effusion or pneumothorax. Scattered endplate spur formation thoracic spine. IMPRESSION: LEFT basilar scarring and minimal RIGHT basilar atelectasis, unchanged. No acute abnormalities. Aortic Atherosclerosis (ICD10-I70.0). Electronically Signed   By: Lavonia Dana M.D.   On: 01/23/2020 12:46    ____________________________________________   PROCEDURES  Procedure(s) performed (including Critical Care):  .Critical Care Performed by: Lucrezia Starch, MD Authorized by: Lucrezia Starch, MD   Critical care provider statement:    Critical care time (minutes):  45   Critical care time was exclusive of:  Separately billable procedures and treating other patients   Critical care was necessary to treat or prevent imminent or life-threatening deterioration of the following conditions:  Cardiac failure, dehydration and sepsis   Critical care was time spent personally by me on the following activities:  Discussions with consultants, evaluation of patient's response to treatment, examination of patient, ordering and performing  treatments and interventions, ordering and review of laboratory studies, ordering and review of radiographic studies, pulse oximetry, re-evaluation of patient's condition, obtaining history from patient or surrogate and review of old  charts     ____________________________________________   INITIAL IMPRESSION / [place] / ED COURSE        Patient presents with above to history exam EMS from home for assessment of cough shortness of breath and general malaise.  Patient is on his home 3 L on arrival.  He is tachycardic with a heart rate of 105, tachypneic with a respiratory of 23 and slightly hypertensive with a BP of 157/80 on arrival.  Patient stable but confused on exam he is not meningitic and there is no obvious foci of infection on exam.  Patient's only complaint is some cough and shortness of breath.  Chest x-ray obtained shows no evidence of clear focal consolidation suggestive of pneumonia and UA is not suggestive of urinary tract infection.  Given a course of infection on exam chest x-ray UA CT obtained as above to assess for foci of infection in the chest abdomen pelvis.  In addition given patient did have some confusion a CT head was obtained.  No clear foci of infection on patient's chest abdomen pelvis CTs. mild bilateral perinephric stranding is seen on prior EKGs and overall a low suspicion for urosepsis as patient's UA does not appear infected.  Covid is negative. CBC with mild leukocytosis with WBC of 13.7 otherwise no significant derangements.  CMP remarkable for T bili of 1.7 no other significant electrolyte or metabolic derangements and unremarkable LFTs.  Initial lactic acid is 1.8.  Magnesium is 1.6.  This was repleted.  VBG with a pH of 7.35 and evidence of acute hypercarbic respiratory failure.  Initial troponin is 387.  Given, nonspecific changes that could be representative of ischemia on ECG is otherwise have significant artifact I did treat patient for an NSTEMI as well with aspirin heparin.  I discussed this with on-call cardiologist Dr. Nehemiah Massed who agreed with these interventions and recommended treating underlying cause of patient's  sepsis.  ____________________________________________   FINAL CLINICAL IMPRESSION(S) / ED DIAGNOSES  Final diagnoses:  Sepsis ([place])  NSTEMI (non-ST elevated myocardial infarction) ([place])  Hypomagnesemia  Tachycardia  Leukocytosis, unspecified type  Tachypnea    Medications  lactated ringers infusion ( Intravenous New Bag/Given 01/23/20 1630)  heparin bolus via infusion 4,000 Units (has no administration in time range)  heparin ADULT infusion 100 units/mL (25000 units/259mL sodium chloride 0.45%) (has no administration in time range)  lactated ringers bolus 1,000 mL (0 mLs Intravenous Stopped 01/23/20 1435)    And  lactated ringers bolus 1,000 mL (0 mLs Intravenous Stopped 01/23/20 1446)    And  lactated ringers bolus 500 mL (0 mLs Intravenous Stopped 01/23/20 1804)  ceFEPIme (MAXIPIME) 2 g in sodium chloride 0.9 % 100 mL IVPB (0 g Intravenous Stopped 01/23/20 1355)  acetaminophen (TYLENOL) tablet 1,000 mg (1,000 mg Oral Given 01/23/20 1302)  vancomycin (VANCOCIN) IVPB 1000 mg/200 mL premix (0 mg Intravenous Stopped 01/23/20 1459)    Followed by  vancomycin (VANCOREADY) IVPB 1500 mg/300 mL (0 mg Intravenous Stopped 01/23/20 1804)  magnesium sulfate IVPB 2 g 50 mL (0 g Intravenous Stopped 01/23/20 1804)  iohexol (OMNIPAQUE) 300 MG/ML solution 125 mL (125 mLs Intravenous Contrast Given 01/23/20 1712)  aspirin chewable tablet 324 mg (324 mg Oral Given 01/23/20 1756)     ED Discharge Orders    None  Note:  This document was prepared using Dragon voice recognition software and may include unintentional dictation errors.   Lucrezia Starch, MD 01/23/20 [phone]

## 2020-01-23 NOTE — Progress Notes (Signed)
[place] for heparin Indication: chest pain/ACS  Allergies  Allergen Reactions  . Farxiga [Dapagliflozin] Rash  . Jardiance [Empagliflozin] Rash    Patient Measurements:   Heparin Dosing Weight: 108 kg  Vital Signs: Temp: 98.8 F (37.1 C) (11/02 1435) Temp Source: Oral (11/02 1435) BP: 141/88 (11/02 1700) Pulse Rate: 110 (11/02 1700)  Labs: Recent Labs    01/23/20 1617 01/23/20 1653  HGB 15.4  --   HCT 45.3  --   PLT 219  --   APTT 28  --   LABPROT 14.1  --   INR 1.1  --   CREATININE 1.06  --   TROPONINIHS 387* 355*    Estimated Creatinine Clearance: 76.9 mL/min (by C-G formula based on SCr of 1.06 mg/dL).   Medical History: Past Medical History:  Diagnosis Date  . Arthritis   . Asthma 2000  . Back pain   . Colon polyp 2012  . COPD (chronic obstructive pulmonary disease) ([place])   . Diabetes mellitus without complication ([place])   . Emphysema of lung ([place])   . Heart murmur   . Hernia 2000  . Hyperlipidemia   . Hypertension   . Personal history of malignant neoplasm of large intestine   . Personal history of tobacco use, presenting hazards to health   . Sleep apnea   . Special screening for malignant neoplasms, colon      Assessment: 79 year old male presented with SOB and fever, s/p fall day prior to arrival. Patient does not appear to be on any anticoagulants PTA. Pharmacy consulted for heparin dosing.  Goal of Therapy:  Heparin level 0.3-0.7 units/ml Monitor platelets by anticoagulation protocol: Yes   Plan:  Heparin 4000 unit bolus followed by heparin drip at 1400 units/hr. HL tomorrow at 0300. CBC daily while on heparin drip.  Tawnya Crook, PharmD 01/23/2020,6:17 PM

## 2020-01-23 NOTE — ED Notes (Signed)
Provider notified that pt c/o urethral and catheter pain. Catheter noted to be draining with blood and small clot passage but significantly less output than before. Pt also noted to have blood and small clots passing around catheter. Awaiting response from physician.

## 2020-01-23 NOTE — ED Notes (Signed)
Message sent to pharmacy requesting verification of medications for RN to administer.

## 2020-01-23 NOTE — H&P (Signed)
History and Physical    Justin Griffith YQI:[id number] DOB: 1941/02/22 DOA: 01/23/2020  PCP: Patient, No Pcp Per  Patient coming from: Home via EMS  I have personally briefly reviewed patient's old medical records in [place]  Chief Complaint: Shortness of breath, fever  HPI: Justin Griffith is a 79 y.o. male with medical history significant for COPD, chronic respiratory failure with hypoxia on 3 L of home O2 via [place], CAD, PVD with chronic venous stasis dermatitis, history of CVA, T2DM, HTN, HLD, BPH, essential tremor, and OSA on CPAP who presents to the ED for evaluation of shortness of breath and fevers.  Patient was recently admitted from 12/14/2019-12/17/2019 for evaluation of altered mental status after a fall and question of seizure-like activity.  Work-up revealed a punctate 3 mm acute ischemic nonhemorrhagic cortical infarct involving the right occipital lobe on MRI brain 12/14/2019.  He was seen by neurology who felt presentation was unlikely due to seizures due to clinical picture and normal EEG.  Patient was discharged home with home health PT/OT.  On my arrival, patient is agitated and keeps insisting that he wants to get out of bed to urinate.  Nurses are at bedside and notified me that he had initial difficulty giving a urine sample requiring a straight cath.  He had bloody urine afterwards.    Patient initially stated he did not want to be admitted and wanted to return home to care for his wife who has dementia.  He says he came to the hospital because he has been having falls at home.  He says he has chronic shortness of breath and cough productive of yellow sputum which is unchanged from his baseline.  He otherwise denies any subjective fevers, chills, diaphoresis, chest pain, palpitations, nausea, vomiting, abdominal pain.  He says before coming to the hospital he did have some burning/pain with urination.  He tells me he is not aware of any underlying prostate issues.  I  discussed with his daughter Justin Griffith by phone who states that patient fell earlier this morning.  He did not have any apparent injury.  She says he does have poor sensation in both of his feet.  After discussing situation with patient's daughter, he is now willing to stay in hospital for further work-up and management.  ED Course:  Initial vitals showed BP 159/96, pulse 110, RR 27, temp 1-1.7 Fahrenheit, SPO2 97% on 4 L supplemental O2 via [place].  Labs are notable for WBC 13.7, hemoglobin 15.4, platelets 219,000, sodium 137, potassium 3.9, bicarb 25, BUN 20, creatinine 1.06, AST 31, ALT 17, alk phos 52, total bilirubin 1.7, BNP 76.3, magnesium 1.6, lactic acid 1.8x2, high-sensitivity troponin I 387 > 355.  Urinalysis showed negative nitrites, negative leukocytes, 0-5 RBC/hpf, 0-5 WBC/hpf, rare bacteria microscopy.  Urine and blood cultures were obtained and pending.  SARS-CoV-2 PCR is negative.  Influenza A/B PCR's are negative.  VBG showed pH 7.35, PCO2 58, PO2 pending.  Portable chest x-ray showed left basilar scarring and minimal right basilar atelectasis unchanged from prior.  No acute abnormalities noted.  CT head without contrast showed mild chronic white matter ischemic changes stable from prior exam without acute abnormality noted.  CT chest/abdomen/pelvis with contrast showed stable parenchymal nodules compared to prior with no evidence of aneurysmal dilatation or dissection.  No large central PE noted.  Fatty infiltration of the liver and prominent prostate are reported without acute intra-abdominal or pelvic abnormality.  Patient was given 2.5 L LR, 2 g  IV magnesium, 1 g Tylenol.  He was started on empiric antibiotics with IV vancomycin and cefepime.  EDP discussed with on-call cardiology regarding elevated troponins who recommended giving aspirin and starting on heparin drip.  The hospitalist service was consulted to admit for further evaluation and management.  Review of Systems: All  systems reviewed and are negative except as documented in history of present illness above.   Past Medical History:  Diagnosis Date  . Arthritis   . Asthma 2000  . Back pain   . Colon polyp 2012  . COPD (chronic obstructive pulmonary disease) ([place])   . Diabetes mellitus without complication ([place])   . Emphysema of lung ([place])   . Heart murmur   . Hernia 2000  . Hyperlipidemia   . Hypertension   . Personal history of malignant neoplasm of large intestine   . Personal history of tobacco use, presenting hazards to health   . Sleep apnea   . Special screening for malignant neoplasms, colon     Past Surgical History:  Procedure Laterality Date  . CARDIAC CATHETERIZATION Left 09/25/2015   Procedure: Left Heart Cath and Coronary Angiography;  Surgeon: Yolonda Kida, MD;  Location: [facility];  Service: Cardiovascular;  Laterality: Left;  . CHOLECYSTECTOMY  1970  . COLON SURGERY  07-16-99   sigmoid colon resection with primary anastomosis, chemotherapy for metastatic disease  . COLONOSCOPY  2001, 2012   Dr Bary Castilla, tubular adenoma of the cecum and ascending colon in 2012.  Marland Kitchen COLONOSCOPY WITH PROPOFOL N/A 02/12/2016   Procedure: COLONOSCOPY WITH PROPOFOL;  Surgeon: Robert Bellow, MD;  Location[facility] ENDOSCOPY;  Service: Endoscopy;  Laterality: N/A;  . ESOPHAGOGASTRODUODENOSCOPY (EGD) WITH PROPOFOL N/A 02/12/2016   Procedure: ESOPHAGOGASTRODUODENOSCOPY (EGD) WITH PROPOFOL;  Surgeon: Robert Bellow, MD;  Location: [facility] ENDOSCOPY;  Service: Endoscopy;  Laterality: N/A;  . HERNIA REPAIR Right    right inguinal hernia repair  . JOINT REPLACEMENT Bilateral    knee replacement  . KNEE SURGERY Bilateral 2010  . MYRINGOTOMY WITH TUBE PLACEMENT Bilateral 08/16/2014   Procedure: MYRINGOTOMY WITH TUBE PLACEMENT;  Surgeon: Carloyn Manner, MD;  Location: [facility] ORS;  Service: ENT;  Laterality: Bilateral;  . PERIPHERAL VASCULAR CATHETERIZATION Right 09/04/2015   Procedure: Lower  Extremity Angiography;  Surgeon: Katha Cabal, MD;  Location: [facility];  Service: Cardiovascular;  Laterality: Right;  . PERIPHERAL VASCULAR CATHETERIZATION  09/04/2015   Procedure: Lower Extremity Intervention;  Surgeon: Katha Cabal, MD;  Location: [facility];  Service: Cardiovascular;;  . TONSILLECTOMY    . TYMPANOSTOMY TUBE PLACEMENT      Social History:  reports that he quit smoking about 30 years ago. His smoking use included cigarettes. He has a 30.00 pack-year smoking history. He has never used smokeless tobacco. He reports that he does not drink alcohol and does not use drugs.  Allergies  Allergen Reactions  . Farxiga [Dapagliflozin] Rash  . Jardiance [Empagliflozin] Rash    Family History  Problem Relation Age of Onset  . Diabetes Father   . Esophageal cancer Mother   . Alzheimer's disease Paternal Uncle      Prior to Admission medications   Medication Sig Start Date End Date Taking? Authorizing Provider  acyclovir (ZOVIRAX) 400 MG tablet Take 400 mg by mouth 2 (two) times daily.    [provider]  aspirin EC 325 MG EC tablet Take 1 tablet (325 mg total) by mouth daily. 12/18/19   Kerney Elbe,  DO  azelastine (ASTELIN) 0.1 % nasal spray Place 2 sprays into both nostrils 2 (two) times daily.  11/22/19   [provider]  carvedilol (COREG) 12.5 MG tablet Take 2 tablets (25 mg total) by mouth 2 (two) times daily with a meal. 01/12/20   [name], Jolene T, NP  clobetasol cream (TEMOVATE) 4.97 % Apply 1 application topically 2 (two) times daily. Apply to bilateral lower legs. 05/23/19   [name], Henrine Screws T, NP  cloNIDine (CATAPRES) 0.2 MG tablet Take 0.2 mg by mouth 2 (two) times daily.    [provider]  donepezil (ARICEPT) 5 MG tablet Take 1 tablet (5 mg total) by mouth at bedtime. 01/01/20   [name], Henrine Screws T, NP  furosemide (LASIX) 20 MG tablet Take 1 tablet (20 mg total) by mouth daily. Patient taking differently:  Take 40 mg by mouth daily.  12/17/19   Raiford Noble Latif, DO  gabapentin (NEURONTIN) 300 MG capsule Take 600 mg by mouth at bedtime.    [provider]  insulin glargine (LANTUS) 100 UNIT/ML injection Inject 0.5 mLs (50 Units total) into the skin daily. 12/18/19   Sheikh, Omair Latif, DO  ipratropium-albuterol (DUONEB) 0.5-2.5 (3) MG/3ML SOLN Take 3 mLs by nebulization every 6 (six) hours.    [provider]  losartan (COZAAR) 100 MG tablet Take 100 mg by mouth daily.    [provider]  meloxicam (MOBIC) 7.5 MG tablet Take 7.5 mg by mouth daily.    [provider]  metFORMIN (GLUCOPHAGE) 1000 MG tablet Take 1,000 mg by mouth 2 (two) times daily with a meal.    [provider]  mupirocin ointment (BACTROBAN) 2 % Apply 1 application topically 2 (two) times daily. To open blisters bilateral legs. 07/29/19   [name], Henrine Screws T, NP  naproxen (NAPROSYN) 500 MG tablet Take 500 mg by mouth 2 (two) times daily with a meal. Arthritis    [provider]  olmesartan (BENICAR) 40 MG tablet Take 1 tablet (40 mg total) by mouth daily. 01/12/20   [name], Henrine Screws T, NP  primidone (MYSOLINE) 50 MG tablet Take 1 tablet (50 mg total) by mouth 2 (two) times daily. 11/28/19   [name], Henrine Screws T, NP  QVAR REDIHALER 40 MCG/ACT inhaler 1 puff 2 (two) times daily.  02/22/19   [provider]  rosuvastatin (CRESTOR) 40 MG tablet Take 1 tablet (40 mg total) by mouth at bedtime. Patient taking differently: Take 40 mg by mouth at bedtime. 20 mg once a day 09/07/17   Guadalupe Maple, MD  tamsulosin (FLOMAX) 0.4 MG CAPS capsule Take 0.4 mg by mouth daily. Patient restarted on his own due to trouble urinating    [provider]  vitamin B-12 (CYANOCOBALAMIN) 500 MCG tablet Take 1,000 mcg by mouth daily. Two at night    [provider]  Vitamin D, Cholecalciferol, 25 MCG (1000 UT) CAPS Take 50 mcg by mouth daily. 2 a night    [provider]     Physical Exam: Vitals:   01/23/20 1630 01/23/20 1700 01/23/20 1730 01/23/20 1800  BP: (!) 147/100 (!) 141/88 (!) 159/98 (!) 166/111  Pulse: (!) 114 (!) 110 (!) 122 (!) 122  Resp: (!) 23 (!) '24 10 14  ' Temp:      TempSrc:      SpO2: 96% 98% 96% 94%   Constitutional: Obese man resting in bed, he is agitated and wanting to get out of bed to urinate, states he wants to leave the hospital  Eyes: PERRL, lids and conjunctivae normal ENMT: Mucous membranes are moist. Posterior pharynx clear of any exudate or lesions. Neck: normal, supple, no masses. Respiratory: clear to auscultation bilaterally, no wheezing, no crackles. Normal respiratory effort. No accessory muscle use.  Cardiovascular: Regular rate and rhythm, systolic murmur present. No extremity edema. 2+ pedal pulses. Abdomen: no tenderness, no masses palpated. No hepatosplenomegaly. Bowel sounds positive.  Musculoskeletal: no clubbing / cyanosis. No joint deformity upper and lower extremities. Good ROM, no contractures. Normal muscle tone.  Skin: Diaphoretic, no rashes, lesions, ulcers. No induration Neurologic: CN 2-12 grossly intact. Sensation intact, moving all extremities equally Psychiatric: Awake, alert, and oriented to self.  He knows he is in the hospital emergency room and that he has been [place].  He knows the month is November but states that the year is 2001.  He says he has been in the hospital for the last 3 days when he has in fact only been here since this morning.    Labs on Admission: I have personally reviewed following labs and imaging studies  CBC: Recent Labs  Lab 01/23/20 1617  WBC 13.7*  NEUTROABS 11.7*  HGB 15.4  HCT 45.3  MCV 91.0  PLT 425   Basic Metabolic Panel: Recent Labs  Lab 01/23/20 1617  NA 137  K 3.9  CL 98  CO2 25  GLUCOSE 122*  BUN 20  CREATININE 1.06  CALCIUM 9.8  MG 1.6*   GFR: Estimated Creatinine Clearance: 76.9 mL/min (by C-G formula based on SCr of 1.06  mg/dL). Liver Function Tests: Recent Labs  Lab 01/23/20 1617  AST 31  ALT 17  ALKPHOS 52  BILITOT 1.7*  PROT 7.1  ALBUMIN 3.4*   No results for input(s): LIPASE, AMYLASE in the last 168 hours. No results for input(s): AMMONIA in the last 168 hours. Coagulation Profile: Recent Labs  Lab 01/23/20 1617  INR 1.1   Cardiac Enzymes: No results for input(s): CKTOTAL, CKMB, CKMBINDEX, TROPONINI in the last 168 hours. BNP (last 3 results) No results for input(s): PROBNP in the last 8760 hours. HbA1C: No results for input(s): HGBA1C in the last 72 hours. CBG: Recent Labs  Lab 01/23/20 1640  GLUCAP 109*   Lipid Profile: No results for input(s): CHOL, HDL, LDLCALC, TRIG, CHOLHDL, LDLDIRECT in the last 72 hours. Thyroid Function Tests: No results for input(s): TSH, T4TOTAL, FREET4, T3FREE, THYROIDAB in the last 72 hours. Anemia Panel: No results for input(s): VITAMINB12, FOLATE, FERRITIN, TIBC, IRON, RETICCTPCT in the last 72 hours. Urine analysis:    Component Value Date/Time   COLORURINE STRAW (A) 01/23/2020 1226   APPEARANCEUR CLEAR (A) 01/23/2020 1226   APPEARANCEUR Clear 10/20/2019 1327   LABSPEC 1.008 01/23/2020 1226   PHURINE 6.0 01/23/2020 1226   GLUCOSEU NEGATIVE 01/23/2020 1226   HGBUR LARGE (A) 01/23/2020 1226   BILIRUBINUR NEGATIVE 01/23/2020 1226   BILIRUBINUR Negative 10/20/2019 1327   KETONESUR NEGATIVE 01/23/2020 1226   PROTEINUR 100 (A) 01/23/2020 1226   NITRITE NEGATIVE 01/23/2020 1226   LEUKOCYTESUR NEGATIVE 01/23/2020 1226    Radiological Exams on Admission: CT CHEST ABDOMEN PELVIS W CONTRAST  Result Date: 01/23/2020 CLINICAL DATA:  Shortness of breath and fever EXAM: CT CHEST, ABDOMEN, AND PELVIS WITH CONTRAST TECHNIQUE: Multidetector CT imaging of the chest, abdomen and pelvis was performed following the standard protocol during bolus administration of intravenous contrast. CONTRAST:  145m OMNIPAQUE IOHEXOL 300 MG/ML  SOLN COMPARISON:  Chest x-ray  from earlier in the same day, CT from 08/05/2011. FINDINGS:  CT CHEST FINDINGS Cardiovascular: Thoracic aorta and its branches demonstrate atherosclerotic calcification. No aneurysmal dilatation or dissection is noted. No large central pulmonary embolus is noted. Cardiac shadow is within normal limits. Coronary calcifications are seen. Mediastinum/Nodes: Thoracic inlet is within normal limits. No sizable hilar or mediastinal adenopathy is noted. The esophagus as visualized is within normal limits. Lungs/Pleura: Mild emphysematous changes are seen. No focal confluent infiltrate is noted. Very mild basilar scarring is seen. 5 mm nodule is noted in the right lower lobe just above the hemidiaphragm stable in appearance from the prior exam. Small right middle lobe nodule is noted along the pleural margin anterior laterally also stable from the prior exam. No new parenchymal nodules are seen. Musculoskeletal: Degenerative changes of the thoracic spine are noted. No acute bony abnormality is seen. CT ABDOMEN PELVIS FINDINGS Hepatobiliary: Mild fatty infiltration of the liver is noted. An area of focal fatty sparing is noted along the falciform ligament. Gallbladder has been surgically. Pancreas: Pancreas is within normal limits. Spleen: Normal in size without focal abnormality. Adrenals/Urinary Tract: Adrenal glands are within normal limits bilaterally. Kidneys demonstrate a normal enhancement pattern bilaterally. Mild perinephric stranding is noted similar to that seen on prior exam. Delayed images demonstrate normal excretion of contrast material. The bladder is well distended. Stomach/Bowel: Postsurgical changes are noted in the colon. No obstructive or inflammatory changes are noted. The appendix is not well visualized although no inflammatory changes to suggest appendicitis are noted. The small bowel and stomach are within normal limits. Vascular/Lymphatic: Aortic atherosclerosis. No enlarged abdominal or pelvic lymph  nodes. Reproductive: Prostate is prominent measuring 7.6 cm in transverse dimension. Other: No abdominal wall hernia or abnormality. No abdominopelvic ascites. Musculoskeletal: Mild degenerative changes of the lumbar spine are noted.  DG Chest Port 1 View  Result Date: 01/23/2020 CLINICAL DATA:  Worsening shortness of breath and fever, fell yesterday, sepsis, lethargy, history asthma, COPD, diabetes mellitus, hypertension EXAM: PORTABLE CHEST 1 VIEW COMPARISON:  Portable exam 1237 hours compared to 12/14/2019 FINDINGS: Normal heart size, mediastinal contours, and pulmonary vascularity. Minimal RIGHT basilar atelectasis and LEFT basilar scarring unchanged. Lungs otherwise clear. No acute infiltrate, pleural effusion or pneumothorax. Scattered endplate spur formation thoracic spine. IMPRESSION: LEFT basilar scarring and minimal RIGHT basilar atelectasis, unchanged. No acute abnormalities. Aortic Atherosclerosis (ICD10-I70.0). Electronically Signed   By: Lavonia Dana M.D.   On: 01/23/2020 12:46    EKG: Personally reviewed. Sinus tachycardia, rate 110, no significant acute ischemic changes, EKG limited due to motion artifact.  Rate is faster when compared to previous.  Assessment/Plan Principal Problem:   Sepsis ([place]) Active Problems:   Hypertension associated with diabetes ([place])   Type 2 diabetes mellitus with proteinuria ([facility])   Hyperlipidemia associated with type 2 diabetes mellitus ([place])   Sleep apnea   BPH (benign prostatic hyperplasia)   COPD (chronic obstructive pulmonary disease) ([facility])   Elevated troponin   Coronary artery disease  Justin Griffith is a 79 y.o. male with medical history significant for COPD, chronic respiratory failure with hypoxia on 3 L of home O2 via [place], CAD, PVD with chronic venous stasis dermatitis, history of CVA, T2DM, HTN, HLD, BPH, essential tremor, and OSA on CPAP who is admitted with sepsis.  Sepsis with unspecified source: Patient presenting on arrival with  fever, tachycardia, tachypnea, and leukocytosis. Clinical picture suggestive of sepsis, however no obvious infectious source found at this time. CT chest/abdomen/pelvis without acute abnormalities. Urinalysis with rare bacteria microscopy. SARS-CoV-2 and influenza A/B PCR's are negative.  No nuchal rigidity or strong suspicion for meningitis. -Continue empiric antibiotics with IV vancomycin, cefepime, Flagyl -Follow blood and urine cultures -Continue IV fluid resuscitation with LR 100 mL/hour overnight  CAD with elevated troponin: High-sensitivity troponin initially elevated 387 with downward trend on repeat 355. Patient does have history of CAD. He denies any recent chest pain.  Suspect demand ischemia in setting of sepsis.  He was initially placed on IV heparin however have discontinued at this time due to hematuria. -Resume home aspirin tomorrow, would hold if having continued significant hematuria -Continue carvedilol 25 mg twice daily -Continue rosuvastatin 40 mg daily -Monitor on telemetry  BPH with hematuria: Foley placed in ED due to concern of bladder outlet obstruction.  Monitor urine output for clearance of hematuria and remove Foley catheter as soon as able.  Will resume home tamsulosin.  Acute septic encephalopathy: Patient apparently more confused from baseline on arrival.  Arrived this morning but stated that he has been here for 3 days.  He is oriented to self and knows he is in [place] in the emergency room.  States the year is 2001.  He was agitated due to wanting to stand up to urinate and wanting to leave AMA but has seemingly now calmed down and more cooperative after discussing with his daughter who will help care for patient's wife who has dementia.  He was recently started on Aricept by his PCP for reported memory issues. -Continue treatment of sepsis with unknown source as above -Fall precautions, requiring safety sitter at time of admission -Hold Aricept for  now  Falls at home/generalized weakness: Reports multiple falls at home with no significant injury. -Request PT/OT eval -Continue fall precautions  COPD/chronic respiratory failure with hypoxia: Stable without acute exacerbation.  Continue Pulmicort with as needed albuterol/duo nebs.  Continue supplemental O2.  Hypomagnesemia: Repleted.  Repeat labs in a.m.  History of CVA: Punctate acute 3 mm acute ischemic nonhemorrhagic cortical infarct involving the right occipital lobe noted on last admission with MRI brain 12/14/2019. -Continue aspirin and statin  Type 2 diabetes: A1c 7.5 on 12/15/2019.  Placed on reduced home dose Lantus 25 units nightly plus sensitive SSI.  Adjust as needed.  Hypertension: Initially with significant hypertension on arrival, now improving. -Continue Coreg 25 mg twice daily -Continue olmesartan 40 mg daily (switched to irbesartan per pharmacy formulary while in hospital) -Holding home Lasix for now -DC losartan from home meds -Hold clonidine, not clear if he has been taking this regularly  Hyperlipidemia: Continue rosuvastatin.  OSA: Continue CPAP nightly.   DVT prophylaxis: SCDs only for now given hematuria Code Status: Full code Family Communication: Discussed with patient's daughter Justin Griffith by phone Disposition Plan: From home, dispo pending improvement in sepsis physiology, further work-up, and PT/OT eval Consults called: None Admission status:  Status is: Inpatient  Remains inpatient appropriate because:Ongoing diagnostic testing needed not appropriate for outpatient work up, Unsafe d/c plan and Inpatient level of care appropriate due to severity of illness   Dispo: The patient is from: Home              Anticipated d/c is to: Home versus SNF              Anticipated d/c date is: 3 days              Patient currently is not medically stable to d/c.  Zada Finders MD [facility]  If 7PM-7AM, please contact  night-coverage [email or web address]  01/23/2020, 6:31 PM

## 2020-01-23 NOTE — ED Triage Notes (Signed)
Pt BIB EMS from home for worsening SOB and fever. EMS reports that pt fell yesterday. Pt is lethargic upon arrival but able to follow commands. AOx4.  EMS:  110 ST 90%  RA 191/101 135 BG

## 2020-01-23 NOTE — Telephone Encounter (Signed)
Noted, agree with this plan for ER visit due to patient risk factors.

## 2020-01-23 NOTE — Progress Notes (Signed)
PHARMACY -  BRIEF ANTIBIOTIC NOTE   Pharmacy has received consult(s) for pneumonia from an ED provider.  The patient's profile has been reviewed for ht/wt/allergies/indication/available labs.    One time order(s) placed for Cefepime and Vanc  Further antibiotics/pharmacy consults should be ordered by admitting physician if indicated.                       Thank you, Renda Rolls, PharmD, [facility] 01/23/2020 12:58 PM

## 2020-01-23 NOTE — Telephone Encounter (Signed)
Last seen by jolene  Copied from [place] [id number]. Topic: General - Inquiry >> Jan 23, 2020 11:43 AM Gillis Ends D wrote: Reason for CRM: Justin Griffith with [facility] health called to let the doctor know that the patient received a fall last night with no injuries but they had to come and help him up. Upon Justin Griffith arrival this morning the patient was short of breath and has a fever of 101-102 and high blood pressure. He called EMS for him again and they are taking him to the hospital.

## 2020-01-23 NOTE — Progress Notes (Signed)
Pharmacy Antibiotic Note  Justin Griffith is a 79 y.o. male admitted on 01/23/2020. Pharmacy has been consulted for vancomycin and cefepime dosing.  Plan: Received vancomycin 2500 mg IV loading dose. Will start vanc 1000 mg IV q12h to start in the morning.  Cefepime 2 g IV q8h     Temp (24hrs), Avg:100.3 F (37.9 C), Min:98.8 F (37.1 C), Max:101.7 F (38.7 C)  Recent Labs  Lab 01/23/20 1617 01/23/20 1653  WBC 13.7*  --   CREATININE 1.06  --   LATICACIDVEN 1.8 1.8    Estimated Creatinine Clearance: 76.9 mL/min (by C-G formula based on SCr of 1.06 mg/dL).    Allergies  Allergen Reactions  . Farxiga [Dapagliflozin] Rash  . Jardiance [Empagliflozin] Rash    Antimicrobials this admission: Vancomycin 11/2 >> Cefepime 11/2 >>    Microbiology results: 11/2 BCx: pending 11/2 UCx: pending    Thank you for allowing pharmacy to be a part of this patient's care.  Tawnya Crook, PharmD 01/23/2020 8:23 PM

## 2020-01-23 NOTE — ED Notes (Signed)
Pt appears more confused than before - MD at bedside to assess

## 2020-01-23 NOTE — ED Notes (Signed)
Patient transferred to hospital bed to promote comfort. Pt in bed with bed low and locked and side rails raised x3. Call bell in reach and bed alarm in place. Pt with yellow non-skid socks in place as well as fall band. Cardiac monitor in use. Pt provided with snack pack per request as well as ice water. Bedside table placed within reach of patient with tv remote on table as requested. Pt denies further needs at this time.

## 2020-01-23 NOTE — ED Notes (Signed)
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

## 2020-01-23 NOTE — Progress Notes (Signed)
CODE SEPSIS - PHARMACY COMMUNICATION  **Broad Spectrum Antibiotics should be administered within 1 hour of Sepsis diagnosis**  Time Code Sepsis Called/Page Received: 1226   Antibiotics Ordered: Cefepime and Vanc  Time of 1st antibiotic administration: [place], PharmD, [facility] 01/23/2020 1:37 PM

## 2020-01-24 ENCOUNTER — Telehealth: Payer: Self-pay | Admitting: Urology

## 2020-01-24 ENCOUNTER — Inpatient Hospital Stay (HOSPITAL_COMMUNITY)
Admit: 2020-01-24 | Discharge: 2020-01-24 | Disposition: A | Discharge status: Home / Self Care | Payer: Medicare Other | Attending: Family Medicine | Admitting: Family Medicine

## 2020-01-24 DIAGNOSIS — R350 Frequency of micturition: Secondary | ICD-10-CM

## 2020-01-24 DIAGNOSIS — R310 Gross hematuria: Secondary | ICD-10-CM | POA: Diagnosis not present

## 2020-01-24 DIAGNOSIS — A419 Sepsis, unspecified organism: Secondary | ICD-10-CM | POA: Diagnosis not present

## 2020-01-24 DIAGNOSIS — R509 Fever, unspecified: Secondary | ICD-10-CM | POA: Diagnosis not present

## 2020-01-24 DIAGNOSIS — R3129 Other microscopic hematuria: Secondary | ICD-10-CM

## 2020-01-24 DIAGNOSIS — R0602 Shortness of breath: Secondary | ICD-10-CM

## 2020-01-24 DIAGNOSIS — N401 Enlarged prostate with lower urinary tract symptoms: Secondary | ICD-10-CM

## 2020-01-24 LAB — GLUCOSE, CAPILLARY
Glucose-Capillary: 181 mg/dL — ABNORMAL HIGH (ref 70–99)
Glucose-Capillary: 213 mg/dL — ABNORMAL HIGH (ref 70–99)
Glucose-Capillary: 172 mg/dL — ABNORMAL HIGH (ref 70–99)
Glucose-Capillary: 192 mg/dL — ABNORMAL HIGH (ref 70–99)

## 2020-01-24 LAB — BLOOD GAS, VENOUS
Acid-Base Excess: 3.3 mmol/L — ABNORMAL HIGH (ref 0.0–2.0)
Bicarbonate: 27.8 mmol/L (ref 20.0–28.0)
O2 Saturation: 99.1 %
Patient temperature: 37
pCO2, Ven: 41 mmHg — ABNORMAL LOW (ref 44.0–60.0)
pH, Ven: 7.44 — ABNORMAL HIGH (ref 7.250–7.430)
pO2, Ven: 131 mmHg — ABNORMAL HIGH (ref 32.0–45.0)

## 2020-01-24 LAB — BASIC METABOLIC PANEL
Anion gap: 7 (ref 5–15)
BUN: 18 mg/dL (ref 8–23)
CO2: 28 mmol/L (ref 22–32)
Calcium: 9.4 mg/dL (ref 8.9–10.3)
Chloride: 98 mmol/L (ref 98–111)
Creatinine, Ser: 1.12 mg/dL (ref 0.61–1.24)
GFR, Estimated: >60 mL/min (ref >60)
Glucose, Bld: 180 mg/dL — ABNORMAL HIGH (ref 70–99)
Potassium: 3.7 mmol/L (ref 3.5–5.1)
Sodium: 133 mmol/L — ABNORMAL LOW (ref 135–145)

## 2020-01-24 LAB — CBC
HCT: 39.8 % (ref 39.0–52.0)
Hemoglobin: 13.7 g/dL (ref 13.0–17.0)
MCH: 31.3 pg (ref 26.0–34.0)
MCHC: 34.4 g/dL (ref 30.0–36.0)
MCV: 90.9 fL (ref 80.0–100.0)
Platelets: 190 10*3/uL (ref 150–400)
RBC: 4.38 MIL/uL (ref 4.22–5.81)
RDW: 12.9 % (ref 11.5–15.5)
WBC: 15.3 10*3/uL — ABNORMAL HIGH (ref 4.0–10.5)
nRBC: 0 % (ref 0.0–0.2)

## 2020-01-24 LAB — URINE CULTURE: Culture: NO GROWTH

## 2020-01-24 LAB — MRSA PCR SCREENING: MRSA by PCR: NEGATIVE

## 2020-01-24 LAB — ECHOCARDIOGRAM LIMITED
Height: 71 in
S' Lateral: 2.89 cm
Weight: 4652.8 oz

## 2020-01-24 LAB — VITAMIN B12: Vitamin B-12: 785 pg/mL (ref 180–914)

## 2020-01-24 LAB — MAGNESIUM: Magnesium: 1.9 mg/dL (ref 1.7–2.4)

## 2020-01-24 LAB — TSH: TSH: 2.069 u[IU]/mL (ref 0.350–4.500)

## 2020-01-24 LAB — PROCALCITONIN: Procalcitonin: 0.87 ng/mL

## 2020-01-24 LAB — TROPONIN I (HIGH SENSITIVITY): Troponin I (High Sensitivity): 144 ng/L (ref <18)

## 2020-01-24 MED ORDER — ORAL CARE MOUTH RINSE
15.0000 mL | Freq: Two times a day (BID) | OROMUCOSAL | Status: DC
  Administered 2020-01-24 – 2020-01-27 (×7): 15 mL via OROMUCOSAL

## 2020-01-24 MED ORDER — CHLORHEXIDINE GLUCONATE CLOTH 2 % EX PADS
6.0000 | MEDICATED_PAD | Freq: Every day | CUTANEOUS | Status: DC
  Administered 2020-01-24 – 2020-01-28 (×3): 6 via TOPICAL

## 2020-01-24 MED ORDER — LABETALOL HCL 5 MG/ML IV SOLN
10.0000 mg | INTRAVENOUS | Status: DC | PRN
  Administered 2020-01-26 – 2020-01-27 (×2): 10 mg via INTRAVENOUS
  Filled 2020-01-24 (×3): qty 4

## 2020-01-24 NOTE — Progress Notes (Signed)
*  PRELIMINARY RESULTS* Echocardiogram 2D Echocardiogram has been performed.  Sherrie Sport 01/24/2020, 10:59 AM

## 2020-01-24 NOTE — Progress Notes (Signed)
This RN noted no urine output from foley catheteer since 6 am.  Bladder scan attempted but not completed due to body habitus.  Attempted flushing catheter without success.  MD notified, Urology consult placed.  RN will continue to assess.  Justice Britain, RN 01/24/2020 10:52 AM

## 2020-01-24 NOTE — Plan of Care (Signed)

## 2020-01-24 NOTE — Telephone Encounter (Signed)
Please arrange outpatient follow-up with me in about a month for BPH/gross hematuria.  Hollice Espy, MD

## 2020-01-24 NOTE — TOC Initial Note (Signed)
Transition of Care [facility]) - Initial/Assessment Note    Patient Details  Name: Justin Griffith MRN: [id number] Date of Birth: 21-Jan-1941  [facility] CM/SW Contact:    Eileen Stanford, LCSW Phone Number: 01/24/2020, 3:59 PM  Clinical Narrative:  Pt was asleep when CSW entered the room--pt did awake to CSW voice. Pt did seem disoriented. Pt states he would rather go back home at d/c. Pt states he has [place] at home arranged. Pt could not recall which agency. In the chart the last agency noted was [facility]. CSW sent out a message to [place] with [facility]--awaiting response.                  Expected Discharge Plan: [place] Barriers to Discharge: Continued Medical Work up   Patient Goals and CMS Choice Patient states their goals for this hospitalization and ongoing recovery are:: to go home   Choice offered to / list presented to : Patient  Expected Discharge Plan and Services Expected Discharge Plan: [place] In-house Referral: Clinical Social Work   Post Acute Care Choice: [place] arrangements for the past 2 months: Single Family Home                           HH Arranged: PT, OT          Prior Living Arrangements/Services Living arrangements for the past 2 months: Single Family Home Lives with:: Spouse Patient language and need for interpreter reviewed:: Yes Do you feel safe going back to the place where you live?: Yes      Need for Family Participation in Patient Care: Yes (Comment) Care giver support system in place?: Yes (comment)   Criminal Activity/Legal Involvement Pertinent to Current Situation/Hospitalization: No - Comment as needed  Activities of Daily Living Home Assistive Devices/Equipment: None ADL Screening (condition at time of admission) Patient's cognitive ability adequate to safely complete daily activities?: Yes Is the patient deaf or have difficulty hearing?: Yes Does the patient have  difficulty seeing, even when wearing glasses/contacts?: No Does the patient have difficulty concentrating, remembering, or making decisions?: No Patient able to express need for assistance with ADLs?: Yes Does the patient have difficulty dressing or bathing?: Yes Independently performs ADLs?: No Communication: Independent Dressing (OT): Needs assistance Is this a change from baseline?: Change from baseline, expected to last <3days Grooming: Needs assistance Is this a change from baseline?: Change from baseline, expected to last <3 days Feeding: Independent Bathing: Needs assistance Is this a change from baseline?: Change from baseline, expected to last <3 days Toileting: Needs assistance Is this a change from baseline?: Change from baseline, expected to last <3 days In/Out Bed: Needs assistance Is this a change from baseline?: Change from baseline, expected to last <3 days Walks in Home: Needs assistance Is this a change from baseline?: Change from baseline, expected to last <3 days Does the patient have difficulty walking or climbing stairs?: Yes Weakness of Legs: Both Weakness of Arms/Hands: None  Permission Sought/Granted Permission sought to share information with : Family Supports    Share Information with NAME: [place] granted to share info w Relationship: spouse     Emotional Assessment Appearance:: Appears stated age Attitude/Demeanor/Rapport: Engaged Affect (typically observed): Accepting, Appropriate Orientation: : Oriented to Situation, Oriented to  Time, Oriented to Place, Oriented to Self Alcohol / Substance Use: Not Applicable Psych Involvement: No (  comment)  Admission diagnosis:  Tachypnea [R06.82] Hypomagnesemia [E83.42] Tachycardia [R00.0] NSTEMI (non-ST elevated myocardial infarction) ([place]) [I21.4] Sepsis ([place]) [A41.9] Leukocytosis, unspecified type [D72.829] Patient Active Problem List   Diagnosis Date Noted  . Gross hematuria   .  Sepsis ([place]) 01/23/2020  . Coronary artery disease 01/23/2020  . History of CVA (cerebrovascular accident) 01/23/2020  . Memory changes 01/01/2020  . Acute metabolic encephalopathy 96/28/3662  . Elevated troponin 12/14/2019  . Pain in both lower extremities 07/14/2019  . Peripheral vascular disease with stasis dermatitis 05/23/2019  . B12 deficiency 04/23/2019  . Constipation 04/21/2019  . Tremor 11/14/2018  . Morbid obesity ([place]) 09/07/2017  . COPD (chronic obstructive pulmonary disease) ([place]) 04/29/2017  . Intertrigo 02/02/2017  . BPH (benign prostatic hyperplasia) 07/23/2016  . Advanced care planning/counseling discussion 07/23/2016  . Eosinophilic esophagitis 94/76/5465  . PAD (peripheral artery disease) ([place]) 01/08/2016  . Gastroesophageal reflux disease without esophagitis 01/06/2016  . Hyperlipidemia associated with type 2 diabetes mellitus ([place]) 01/16/2015  . Sleep apnea 01/16/2015  . BMI 40.0-44.9, adult ([place]) 01/16/2015  . Hypertension associated with diabetes ([place]) 10/11/2014  . Type 2 diabetes mellitus with proteinuria ([place]) 10/11/2014  . Back pain 10/11/2014  . History of colon cancer    PCP:  Patient, No Pcp Per Pharmacy:   [facility] [address] [place] [id number] Phone: [phone] Fax: [phone]     Social Determinants of Health (SDOH) Interventions    Readmission Risk Interventions No flowsheet data found.

## 2020-01-24 NOTE — Consult Note (Signed)
Urology Consult  I have been asked to see the patient by Dr. Alphonzo Dublin. , for evaluation and management of gross hematuria, catheter difficulty.  Chief Complaint: Altered mental status  History of Present Illness: Justin Griffith is a 79 y.o. year old male with multiple medical comorbidities admitted with sepsis of unknown origin.  In the emergency room yesterday evening, he had difficulty urinating to provide a sample with straight cath.  Nursing notes indicate that this was somewhat difficult/possibly traumatic with blood at the time of straight cath.  A Foley catheter was subsequently placed due to concern for bladder outlet obstruction and "clearance of hematuria".  Overnight, the catheter has had issues draining, clots, low output and leaking urine around the Foley.  Urology was asked to assess the catheter.  CT indicates a mildly distended bladder with significant prostamegaly, prostate size greater than 7 cm in craniocaudal direction.  He has some mild stranding around the kidneys but his urinalysis is unremarkable.  Does not appear to be the source of his sepsis.  Patient does report that he has been diagnosed in the past with an enlarged prostate.  He does not have a urologist.  Has not ever had prostate surgery.  He is currently on Flomax.  He is a former smoker.  He is currently on broad-spectrum antibiotics.  Upon my evaluation, the catheter did in fact have blood-tinged urine in the bag a few small clots ~150 cc with urine in the tubing.  It was irrigated with 60 cc both injected and withdrawn.  A few small clots returned otherwise the urine is light pink.  It irrigated relatively easily.  He did have a bladder spasm at the time which was felt through the syringe.  Past Medical History:  Diagnosis Date  . Arthritis   . Asthma 2000  . Back pain   . Colon polyp 2012  . COPD (chronic obstructive pulmonary disease) ([place])   . Diabetes mellitus without complication ([place])   .  Emphysema of lung ([place])   . Heart murmur   . Hernia 2000  . Hyperlipidemia   . Hypertension   . Personal history of malignant neoplasm of large intestine   . Personal history of tobacco use, presenting hazards to health   . Sleep apnea   . Special screening for malignant neoplasms, colon     Past Surgical History:  Procedure Laterality Date  . CARDIAC CATHETERIZATION Left 09/25/2015   Procedure: Left Heart Cath and Coronary Angiography;  Surgeon: Yolonda Kida, MD;  Location: [facility];  Service: Cardiovascular;  Laterality: Left;  . CHOLECYSTECTOMY  1970  . COLON SURGERY  07-16-99   sigmoid colon resection with primary anastomosis, chemotherapy for metastatic disease  . COLONOSCOPY  2001, 2012   Dr Bary Castilla, tubular adenoma of the cecum and ascending colon in 2012.  Marland Kitchen COLONOSCOPY WITH PROPOFOL N/A 02/12/2016   Procedure: COLONOSCOPY WITH PROPOFOL;  Surgeon: Robert Bellow, MD;  Location: [facility]OSCOPY;  Service: Endoscopy;  Laterality: N/A;  . ESOPHAGOGASTRODUODENOSCOPY (EGD) WITH PROPOFOL N/A 02/12/2016   Procedure: ESOPHAGOGASTRODUODENOSCOPY (EGD) WITH PROPOFOL;  Surgeon: Robert Bellow, MD;  Location: [facility] ENDOSCOPY;  Service: Endoscopy;  Laterality: N/A;  . HERNIA REPAIR Right    right inguinal hernia repair  . JOINT REPLACEMENT Bilateral    knee replacement  . KNEE SURGERY Bilateral 2010  . MYRINGOTOMY WITH TUBE PLACEMENT Bilateral 08/16/2014   Procedure: MYRINGOTOMY WITH TUBE PLACEMENT;  Surgeon: Carloyn Manner, MD;  Location:  [facility] ORS;  Service: ENT;  Laterality: Bilateral;  . PERIPHERAL VASCULAR CATHETERIZATION Right 09/04/2015   Procedure: Lower Extremity Angiography;  Surgeon: Katha Cabal, MD;  Location: [place] [facility];  Service: Cardiovascular;  Laterality: Right;  . PERIPHERAL VASCULAR CATHETERIZATION  09/04/2015   Procedure: Lower Extremity Intervention;  Surgeon: Katha Cabal, MD;  Location: [facility];  Service:  Cardiovascular;;  . TONSILLECTOMY    . TYMPANOSTOMY TUBE PLACEMENT      Home Medications:  Current Meds  Medication Sig  . carvedilol (COREG) 12.5 MG tablet Take 2 tablets (25 mg total) by mouth 2 (two) times daily with a meal.  . donepezil (ARICEPT) 5 MG tablet Take 1 tablet (5 mg total) by mouth at bedtime.  . furosemide (LASIX) 20 MG tablet Take 1 tablet (20 mg total) by mouth daily.  . meloxicam (MOBIC) 7.5 MG tablet Take 7.5 mg by mouth daily.  Marland Kitchen olmesartan (BENICAR) 40 MG tablet Take 1 tablet (40 mg total) by mouth daily.  . primidone (MYSOLINE) 50 MG tablet Take 1 tablet (50 mg total) by mouth 2 (two) times daily.    Allergies:  Allergies  Allergen Reactions  . Farxiga [Dapagliflozin] Rash  . Jardiance [Empagliflozin] Rash    Family History  Problem Relation Age of Onset  . Diabetes Father   . Esophageal cancer Mother   . Alzheimer's disease Paternal Uncle     Social History:  reports that he quit smoking about 30 years ago. His smoking use included cigarettes. He has a 30.00 pack-year smoking history. He has never used smokeless tobacco. He reports that he does not drink alcohol and does not use drugs.  ROS: A complete review of systems was performed.  All systems are negative except for pertinent findings as noted.  Physical Exam:  Vital signs in last 24 hours: Temp:  [98 F (36.7 C)-99.8 F (37.7 C)] 98 F (36.7 C) (11/03 1149) Pulse Rate:  [64-125] 64 (11/03 1149) Resp:  [9-26] 18 (11/03 1149) BP: (104-193)/(56-124) 104/56 (11/03 1149) SpO2:  [88 %-100 %] 100 % (11/03 1149) Weight:  [131.9 kg-133.4 kg] 131.9 kg (11/03 0415) Constitutional:  Alert and oriented, somewhat groggy but answering questions appropriately HEENT: [place] AT, moist mucus membranes.  Trachea midline, no masses GI: Abdomen is soft, nontender, nondistended, no abdominal masses, large pannus GU: Foley initially snaked under patient's leg, sitting on tubing.  This was repositioned  appropriately.  Uncircumcised phallus. Skin: No rashes, bruises or suspicious lesions Neurologic: Grossly intact, no focal deficits, moving all 4 extremities Psychiatric: Normal mood and affect   Laboratory Data:  Recent Labs    01/23/20 1617 01/24/20 0431  WBC 13.7* 15.3*  HGB 15.4 13.7  HCT 45.3 39.8   Recent Labs    01/23/20 1617 01/24/20 0431  NA 137 133*  K 3.9 3.7  CL 98 98  CO2 25 28  GLUCOSE 122* 180*  BUN 20 18  CREATININE 1.06 1.12  CALCIUM 9.8 9.4   Recent Labs    01/23/20 1617  INR 1.1   No results for input(s): LABURIN in the last 72 hours. Results for orders placed or performed during the hospital encounter of 01/23/20  Respiratory Panel by RT PCR (Flu A&B, Covid) - Nasopharyngeal Swab     Status: None   Collection Time: 01/23/20  1:02 PM   Specimen: Nasopharyngeal Swab  Result Value Ref Range Status   SARS Coronavirus 2 by RT PCR NEGATIVE NEGATIVE Final    Comment: (  NOTE) SARS-CoV-2 target nucleic acids are NOT DETECTED.  The SARS-CoV-2 RNA is generally detectable in upper respiratoy specimens during the acute phase of infection. The lowest concentration of SARS-CoV-2 viral copies this assay can detect is 131 copies/mL. A negative result does not preclude SARS-Cov-2 infection and should not be used as the sole basis for treatment or other patient management decisions. A negative result may occur with  improper specimen collection/handling, submission of specimen other than nasopharyngeal swab, presence of viral mutation(s) within the areas targeted by this assay, and inadequate number of viral copies (<131 copies/mL). A negative result must be combined with clinical observations, patient history, and epidemiological information. The expected result is Negative.  Fact Sheet for Patients:  PinkCheek.be  Fact Sheet for Healthcare Providers:  GravelBags.it  This test is no t yet approved  or cleared by the [place] FDA and  has been authorized for detection and/or diagnosis of SARS-CoV-2 by FDA under an Emergency Use Authorization (EUA). This EUA will remain  in effect (meaning this test can be used) for the duration of the COVID-19 declaration under Section 564(b)(1) of the Act, 21 U.S.C. section 360bbb-3(b)(1), unless the authorization is terminated or revoked sooner.     Influenza A by PCR NEGATIVE NEGATIVE Final   Influenza B by PCR NEGATIVE NEGATIVE Final    Comment: (NOTE) The Xpert Xpress SARS-CoV-2/FLU/RSV assay is intended as an aid in  the diagnosis of influenza from Nasopharyngeal swab specimens and  should not be used as a sole basis for treatment. Nasal washings and  aspirates are unacceptable for Xpert Xpress SARS-CoV-2/FLU/RSV  testing.  Fact Sheet for Patients: PinkCheek.be  Fact Sheet for Healthcare Providers: GravelBags.it  This test is not yet approved or cleared by the [place] FDA and  has been authorized for detection and/or diagnosis of SARS-CoV-2 by  FDA under an Emergency Use Authorization (EUA). This EUA will remain  in effect (meaning this test can be used) for the duration of the  Covid-19 declaration under Section 564(b)(1) of the Act, 21  U.S.C. section 360bbb-3(b)(1), unless the authorization is  terminated or revoked. Performed at [facility], [address] [place], [place] [address]   Blood Culture (routine x 2)     Status: None (Preliminary result)   Collection Time: 01/23/20  4:16 PM   Specimen: BLOOD  Result Value Ref Range Status   Specimen Description BLOOD BLOOD LEFT HAND  Final   Special Requests   Final    BOTTLES DRAWN AEROBIC ONLY Blood Culture results may not be optimal due to an inadequate volume of blood received in culture bottles   Culture   Final    NO GROWTH < 24 HOURS Performed at [facility], [address],  [place], [place] [address]    Report Status PENDING  Incomplete  Blood Culture (routine x 2)     Status: None (Preliminary result)   Collection Time: 01/23/20  4:16 PM   Specimen: BLOOD  Result Value Ref Range Status   Specimen Description BLOOD BLOOD RIGHT HAND  Final   Special Requests   Final    BOTTLES DRAWN AEROBIC AND ANAEROBIC Blood Culture adequate volume   Culture   Final    NO GROWTH < 24 HOURS Performed at [facility], [address], [place], [place] [address]    Report Status PENDING  Incomplete     Radiologic Imaging: CT Head Wo Contrast  Result Date: 01/23/2020 CLINICAL DATA:  Altered mental status  EXAM: CT HEAD WITHOUT CONTRAST TECHNIQUE: Contiguous axial images were obtained from the base of the skull through the vertex without intravenous contrast. COMPARISON:  12/16/2019 FINDINGS: Brain: No evidence of acute infarction, hemorrhage, hydrocephalus, extra-axial collection or mass lesion/mass effect. Mild chronic white matter ischemic changes noted. Vascular: No hyperdense vessel or unexpected calcification. Skull: Normal. Negative for fracture or focal lesion. Sinuses/Orbits: No acute finding. Other: None. IMPRESSION: Mild chronic white matter ischemic changes stable from the prior exam. No acute abnormality noted. Electronically Signed   By: Inez Catalina M.D.   On: 01/23/2020 17:55   CT CHEST ABDOMEN PELVIS W CONTRAST  Result Date: 01/23/2020 CLINICAL DATA:  Shortness of breath and fever EXAM: CT CHEST, ABDOMEN, AND PELVIS WITH CONTRAST TECHNIQUE: Multidetector CT imaging of the chest, abdomen and pelvis was performed following the standard protocol during bolus administration of intravenous contrast. CONTRAST:  166mL OMNIPAQUE IOHEXOL 300 MG/ML  SOLN COMPARISON:  Chest x-ray from earlier in the same day, CT from 08/05/2011. FINDINGS: CT CHEST FINDINGS Cardiovascular: Thoracic aorta and its branches demonstrate atherosclerotic calcification. No aneurysmal dilatation or  dissection is noted. No large central pulmonary embolus is noted. Cardiac shadow is within normal limits. Coronary calcifications are seen. Mediastinum/Nodes: Thoracic inlet is within normal limits. No sizable hilar or mediastinal adenopathy is noted. The esophagus as visualized is within normal limits. Lungs/Pleura: Mild emphysematous changes are seen. No focal confluent infiltrate is noted. Very mild basilar scarring is seen. 5 mm nodule is noted in the right lower lobe just above the hemidiaphragm stable in appearance from the prior exam. Small right middle lobe nodule is noted along the pleural margin anterior laterally also stable from the prior exam. No new parenchymal nodules are seen. Musculoskeletal: Degenerative changes of the thoracic spine are noted. No acute bony abnormality is seen. CT ABDOMEN PELVIS FINDINGS Hepatobiliary: Mild fatty infiltration of the liver is noted. An area of focal fatty sparing is noted along the falciform ligament. Gallbladder has been surgically. Pancreas: Pancreas is within normal limits. Spleen: Normal in size without focal abnormality. Adrenals/Urinary Tract: Adrenal glands are within normal limits bilaterally. Kidneys demonstrate a normal enhancement pattern bilaterally. Mild perinephric stranding is noted similar to that seen on prior exam. Delayed images demonstrate normal excretion of contrast material. The bladder is well distended. Stomach/Bowel: Postsurgical changes are noted in the colon. No obstructive or inflammatory changes are noted. The appendix is not well visualized although no inflammatory changes to suggest appendicitis are noted. The small bowel and stomach are within normal limits. Vascular/Lymphatic: Aortic atherosclerosis. No enlarged abdominal or pelvic lymph nodes. Reproductive: Prostate is prominent measuring 7.6 cm in transverse dimension. Other: No abdominal wall hernia or abnormality. No abdominopelvic ascites. Musculoskeletal: Mild degenerative  changes of the lumbar spine are noted.  DG Chest Port 1 View  Result Date: 01/23/2020 CLINICAL DATA:  Worsening shortness of breath and fever, fell yesterday, sepsis, lethargy, history asthma, COPD, diabetes mellitus, hypertension EXAM: PORTABLE CHEST 1 VIEW COMPARISON:  Portable exam 1237 hours compared to 12/14/2019 FINDINGS: Normal heart size, mediastinal contours, and pulmonary vascularity. Minimal RIGHT basilar atelectasis and LEFT basilar scarring unchanged. Lungs otherwise clear. No acute infiltrate, pleural effusion or pneumothorax. Scattered endplate spur formation thoracic spine. IMPRESSION: LEFT basilar scarring and minimal RIGHT basilar atelectasis, unchanged. No acute abnormalities. Aortic Atherosclerosis (ICD10-I70.0). Electronically Signed   By: Lavonia Dana M.D.   On: 01/23/2020 12:46   ECHOCARDIOGRAM LIMITED  Result Date: 01/24/2020    ECHOCARDIOGRAM LIMITED REPORT   Patient Name:  Justin Griffith [facility] Date of Exam: 01/24/2020 Medical Rec #:  [id number]      Height:       71.0 in Accession #:    [id number]     Weight:       290.8 lb Date of Birth:  August 26, 1940      BSA:          2.472 m Patient Age:    89 years       BP:           not listed in chart/not listed in                                              chart mmHg Patient Gender: M              HR:           Not listed in chart bpm. Exam Location:  [facility] Procedure: Limited Echo, Color Doppler and Cardiac Doppler Indications:     Elevated troponin  History:         Patient has prior history of Echocardiogram examinations, most                  recent 12/16/2019. COPD; Risk Factors:Sleep Apnea, Hypertension                  and Diabetes. History of tobacco use.  Sonographer:     Sherrie Sport RDCS (AE) Referring Phys:  [phone] A CALDWELL POWELL JR Diagnosing Phys: Kate Sable MD  Sonographer Comments: Technically difficult study due to poor echo windows, suboptimal parasternal window and no apical window. The best view was subcostal view.  IMPRESSIONS  1. Left ventricular ejection fraction, by estimation, is 60 to 65%. The left ventricle has normal function. The left ventricle has no regional wall motion abnormalities. Left ventricular diastolic function could not be evaluated.  2. Right ventricular systolic function is normal. The right ventricular size is normal.  3. The mitral valve is normal in structure. No evidence of mitral valve regurgitation. FINDINGS  Left Ventricle: Left ventricular ejection fraction, by estimation, is 60 to 65%. The left ventricle has normal function. The left ventricle has no regional wall motion abnormalities. The left ventricular internal cavity size was normal in size. Left ventricular diastolic function could not be evaluated. Right Ventricle: The right ventricular size is normal. Right ventricular systolic function is normal. Pericardium: There is no evidence of pericardial effusion. Mitral Valve: The mitral valve is normal in structure. Tricuspid Valve: The tricuspid valve is normal in structure. Tricuspid valve regurgitation is not demonstrated. Pulmonic Valve: The pulmonic valve was not assessed. Aorta: The aortic root is normal in size and structure. Venous: The inferior vena cava was not well visualized. LEFT VENTRICLE PLAX 2D LVIDd:         4.25 cm LVIDs:         2.89 cm LV PW:         1.46 cm LV IVS:        1.44 cm LVOT diam:     2.00 cm LVOT Area:     3.14 cm  LEFT ATRIUM         Index LA diam:    3.00 cm 1.21 cm/m   AORTA Ao Root diam: 3.10 cm  SHUNTS Systemic Diam: 2.00 cm Kate Sable MD Electronically signed by Aaron Edelman  Agbor-Etang MD Signature Date/Time: 01/24/2020/12:56:21 PM    Final    CT abdomen pelvis was personally reviewed.  Impression/plan:  1.  Gross hematuria-minimal, suspect likely secondary to Foley catheter trauma in the setting of significant prostamegaly.  We will assess the need for outpatient hematuria work-up once he improves from this acute illness.  He does have risk factors  including a smoking history which may warrant outpatient cystoscopy.  Overall, the degree of hematuria is not severe, catheter is draining fine.  Irrigate as needed and would recommend removing the Foley catheter as soon as possible in the setting of very large friable prostate and ongoing trauma.  2.  Prostamegaly/BPH-there is no clear evidence that he is in urinary retention at this time, bladder was distended at the time of study but he had no hydroureteronephrosis.  Creatinine was reasonably normal.  Continue Flomax.   01/24/2020, 1:20 PM  Hollice Espy,  MD   Findings were communicated with Dr. Florene Glen.  Please reach out to urology if there is any continuing catheter issues.  Please DC Foley as soon as possible.  We will arrange outpatient follow-up.

## 2020-01-24 NOTE — Progress Notes (Signed)
OT Cancellation Note  Patient Details Name: Justin Griffith MRN: [id number] DOB: [birth year]   Cancelled Treatment:    Reason Eval/Treat Not Completed: Patient at procedure or test/ unavailable  OT consult received and chart reviewed. Pt currently with urology getting catheter irrigated. Will f/u at later date/time as able/available. Thank you.  Gerrianne Scale, [place], OTR/L ascom [phone] 01/24/20, 1:12 PM

## 2020-01-24 NOTE — Progress Notes (Addendum)
PROGRESS NOTE    Justin Griffith  IRC:[id number] DOB: 01/27/1941 DOA: 01/23/2020 PCP: Patient, No Pcp Per   Chief Complaint  Patient presents with   Shortness of Breath    Brief Narrative:  Justin Griffith is Justin Griffith 79 y.o. male with medical history significant for COPD, chronic respiratory failure with hypoxia on 3 L of home O2 via [place], CAD, PVD with chronic venous stasis dermatitis, history of CVA, T2DM, HTN, HLD, BPH, essential tremor, and OSA on CPAP who is admitted with sepsis without [place] clear source.  Assessment & Plan:   Principal Problem:   Sepsis ([place]) Active Problems:   Hypertension associated with diabetes ([place])   Type 2 diabetes mellitus with proteinuria (HCC)   Hyperlipidemia associated with type 2 diabetes mellitus (HCC)   Sleep apnea   BPH (benign prostatic hyperplasia)   COPD (chronic obstructive pulmonary disease) (HCC)   Elevated troponin   Coronary artery disease   History of CVA (cerebrovascular accident)  Sepsis with unspecified source: Patient presenting on arrival with fever, tachycardia, tachypnea, and leukocytosis. Clinical picture suggestive of sepsis, however no obvious infectious source found at this time. CT chest/abdomen/pelvis without acute abnormalities - mild perinephric stranding (see report). Urinalysis with rare bacteria microscopy. SARS-CoV-2 and influenza Miro Balderson/B PCR's are negative. No nuchal rigidity or strong suspicion for meningitis. -Continue empiric antibiotics with IV vancomycin, cefepime, Flagyl -> deescalate as able (will stop vanc) -Follow blood and urine cultures -Continue IV fluid resuscitation with LR 100 mL/hour overnight  CAD with elevated troponin: High-sensitivity troponin initially elevated 387 with downward trend on repeat 355. Patient does have history of CAD. He denies any recent chest pain.  Suspect demand ischemia in setting of sepsis. - echo without RWMA, EF 60-65% - continue aspirin, coreg, statin.  Heparin was d/c'd in setting  of his hematuria. - given troponin elevated above baseline, will discuss with cardiology  BPH with hematuria: Appreciate urology recomemndations Hematuria suspected 2/2 foley trauma Urology c/s given issues this AM, appreciate recommendations - will d/c foley today Continue flomax  Acute septic encephalopathy: Today he's tired, seems sleepy VBG without hypercarbia.  Ammonia 19.  Follow B12, TSH (wnl).   Suspect encephalopathy is 2/2 infection above -Continue treatment of sepsis with unknown source as above -Fall precautions, requiring safety sitter at time of admission -Hold Aricept for now  Falls at home/generalized weakness: Reports multiple falls at home with no significant injury. -Request PT/OT eval -Continue fall precautions  COPD/chronic respiratory failure with hypoxia: Stable without acute exacerbation.  Continue Pulmicort with as needed albuterol/duo nebs.  Continue supplemental O2.  Hypomagnesemia: Repleted.  Repeat labs in [place].m.  History of CVA: Punctate acute 3 mm acute ischemic nonhemorrhagic cortical infarct involving the right occipital lobe noted on last admission with MRI brain 12/14/2019. -Continue aspirin and statin  Type 2 diabetes: A1c 7.5 on 12/15/2019.  Placed on reduced home dose Lantus 25 units nightly plus sensitive SSI.  Adjust as needed.  Hypertension: BP improved -Continue Coreg 25 mg twice daily -Continue olmesartan 40 mg daily (switched to irbesartan per pharmacy formulary while in hospital) -Holding home Lasix for now -DC losartan from home meds -Hold clonidine, not clear if he has been taking this regularly  Hyperlipidemia: Continue rosuvastatin.  OSA: Continue CPAP nightly.  DVT prophylaxis: SCD Code Status: full  Family Communication: none at bedside - daughter over phone Disposition:   Status is: Inpatient  Remains inpatient appropriate because:Inpatient level of care appropriate due to severity of  illness  Dispo: The patient is from: Home              Anticipated d/c is to: Home              Anticipated d/c date is: > 3 days              Patient currently is not medically stable to d/c.  Consultants:   none  Procedures:  Echo IMPRESSIONS    1. Left ventricular ejection fraction, by estimation, is 60 to 65%. The  left ventricle has normal function. The left ventricle has no regional  wall motion abnormalities. Left ventricular diastolic function could not  be evaluated.  2. Right ventricular systolic function is normal. The right ventricular  size is normal.  3. The mitral valve is normal in structure. No evidence of mitral valve  regurgitation  Antimicrobials:  Anti-infectives (From admission, onward)   Start     Dose/Rate Route Frequency Ordered Stop   01/24/20 0600  vancomycin (VANCOCIN) IVPB 1000 mg/200 mL premix        1,000 mg 200 mL/hr over 60 Minutes Intravenous Every 12 hours 01/23/20 2010     01/23/20 2200  ceFEPIme (MAXIPIME) 2 g in sodium chloride 0.9 % 100 mL IVPB        2 g 200 mL/hr over 30 Minutes Intravenous Every 8 hours 01/23/20 2010     01/23/20 2000  metroNIDAZOLE (FLAGYL) IVPB 500 mg        500 mg 100 mL/hr over 60 Minutes Intravenous Every 8 hours 01/23/20 1941     01/23/20 1300  vancomycin (VANCOCIN) IVPB 1000 mg/200 mL premix       "Followed by" Linked Group Details   1,000 mg 200 mL/hr over 60 Minutes Intravenous  Once 01/23/20 1250 01/23/20 1459   01/23/20 1300  vancomycin (VANCOREADY) IVPB 1500 mg/300 mL       "Followed by" Linked Group Details   1,500 mg 150 mL/hr over 120 Minutes Intravenous  Once 01/23/20 1250 01/23/20 1804   01/23/20 1230  vancomycin (VANCOCIN) IVPB 1000 mg/200 mL premix  Status:  Discontinued        1,000 mg 200 mL/hr over 60 Minutes Intravenous  Once 01/23/20 1227 01/23/20 1248   01/23/20 1230  ceFEPIme (MAXIPIME) 2 g in sodium chloride 0.9 % 100 mL IVPB        2 g 200 mL/hr over 30 Minutes Intravenous   Once 01/23/20 1227 01/23/20 1355         Subjective: Sleepy, awakens to voice Denies complaints  Objective: Vitals:   01/24/20 0415 01/24/20 0807 01/24/20 0844 01/24/20 1149  BP:  127/69  (!) 104/56  Pulse:  91  64  Resp:  17  18  Temp:  98.6 F (37 C)  98 F (36.7 C)  TempSrc:  Oral    SpO2:  93% 95% 100%  Weight: 131.9 kg     Height:        Intake/Output Summary (Last 24 hours) at 01/24/2020 1330 Last data filed at 01/24/2020 0626 Gross per 24 hour  Intake 4631.67 ml  Output 2100 ml  Net 2531.67 ml   Filed Weights   01/24/20 0052 01/24/20 0415  Weight: 133.4 kg 131.9 kg    Examination:  General exam: Appears calm and comfortable  Respiratory system: Clear to auscultation. Respiratory effort normal. Cardiovascular system: S1 & S2 heard, RRR Gastrointestinal system: Abdomen is nondistended, soft and nontender Central nervous system: mildly lethargic, awakens to voice. No  focal neurological deficits. Extremities: no LEE Skin: No rashes, lesions or ulcers Psychiatry: Judgement and insight appear normal. Mood & affect appropriate.     Data Reviewed: I have personally reviewed following labs and imaging studies  CBC: Recent Labs  Lab 01/23/20 1617 01/24/20 0431  WBC 13.7* 15.3*  NEUTROABS 11.7*  --   HGB 15.4 13.7  HCT 45.3 39.8  MCV 91.0 90.9  PLT 219 976    Basic Metabolic Panel: Recent Labs  Lab 01/23/20 1617 01/24/20 0431  NA 137 133*  K 3.9 3.7  CL 98 98  CO2 25 28  GLUCOSE 122* 180*  BUN 20 18  CREATININE 1.06 1.12  CALCIUM 9.8 9.4  MG 1.6* 1.9    GFR: Estimated Creatinine Clearance: 72.8 mL/min (by C-G formula based on SCr of 1.12 mg/dL).  Liver Function Tests: Recent Labs  Lab 01/23/20 1617  AST 31  ALT 17  ALKPHOS 52  BILITOT 1.7*  PROT 7.1  ALBUMIN 3.4*    CBG: Recent Labs  Lab 01/23/20 1640 01/23/20 2249 01/24/20 0806 01/24/20 1144  GLUCAP 109* 188* 181* 213*     Recent Results (from the past 240 hour(s))   Respiratory Panel by RT PCR (Flu Cortina Vultaggio&B, Covid) - Nasopharyngeal Swab     Status: None   Collection Time: 01/23/20  1:02 PM   Specimen: Nasopharyngeal Swab  Result Value Ref Range Status   SARS Coronavirus 2 by RT PCR NEGATIVE NEGATIVE Final    Comment: (NOTE) SARS-CoV-2 target nucleic acids are NOT DETECTED.  The SARS-CoV-2 RNA is generally detectable in upper respiratoy specimens during the acute phase of infection. The lowest concentration of SARS-CoV-2 viral copies this assay can detect is 131 copies/mL. Kieran Nachtigal negative result does not preclude SARS-Cov-2 infection and should not be used as the sole basis for treatment or other patient management decisions. Shonya Sumida negative result may occur with  improper specimen collection/handling, submission of specimen other than nasopharyngeal swab, presence of viral mutation(s) within the areas targeted by this assay, and inadequate number of viral copies (<131 copies/mL). Titilayo Hagans negative result must be combined with clinical observations, patient history, and epidemiological information. The expected result is Negative.  Fact Sheet for Patients:  PinkCheek.be  Fact Sheet for Healthcare Providers:  GravelBags.it  This test is no t yet approved or cleared by the [place] FDA and  has been authorized for detection and/or diagnosis of SARS-CoV-2 by FDA under an Emergency Use Authorization (EUA). This EUA will remain  in effect (meaning this test can be used) for the duration of the COVID-19 declaration under Section 564(b)(1) of the Act, 21 U.S.C. section 360bbb-3(b)(1), unless the authorization is terminated or revoked sooner.     Influenza Raeshaun Simson by PCR NEGATIVE NEGATIVE Final   Influenza B by PCR NEGATIVE NEGATIVE Final    Comment: (NOTE) The Xpert Xpress SARS-CoV-2/FLU/RSV assay is intended as an aid in  the diagnosis of influenza from Nasopharyngeal swab specimens and  should not be used as  Anna-Marie Coller sole basis for treatment. Nasal washings and  aspirates are unacceptable for Xpert Xpress SARS-CoV-2/FLU/RSV  testing.  Fact Sheet for Patients: PinkCheek.be  Fact Sheet for Healthcare Providers: GravelBags.it  This test is not yet approved or cleared by the [place] FDA and  has been authorized for detection and/or diagnosis of SARS-CoV-2 by  FDA under an Emergency Use Authorization (EUA). This EUA will remain  in effect (meaning this test can be used) for the duration of the  Covid-19 declaration under  Section 564(b)(1) of the Act, 21  U.S.C. section 360bbb-3(b)(1), unless the authorization is  terminated or revoked. Performed at [facility], [address] [place], [place] [address]   Blood Culture (routine x 2)     Status: None (Preliminary result)   Collection Time: 01/23/20  4:16 PM   Specimen: BLOOD  Result Value Ref Range Status   Specimen Description BLOOD BLOOD LEFT HAND  Final   Special Requests   Final    BOTTLES DRAWN AEROBIC ONLY Blood Culture results may not be optimal due to an inadequate volume of blood received in culture bottles   Culture   Final    NO GROWTH < 24 HOURS Performed at [facility], [address], [place], [place] [address]    Report Status PENDING  Incomplete  Blood Culture (routine x 2)     Status: None (Preliminary result)   Collection Time: 01/23/20  4:16 PM   Specimen: BLOOD  Result Value Ref Range Status   Specimen Description BLOOD BLOOD RIGHT HAND  Final   Special Requests   Final    BOTTLES DRAWN AEROBIC AND ANAEROBIC Blood Culture adequate volume   Culture   Final    NO GROWTH < 24 HOURS Performed at [facility], [address], [place], [place] [address]    Report Status PENDING  Incomplete         Radiology Studies: CT Head Wo Contrast  Result Date: 01/23/2020 CLINICAL DATA:  Altered mental status EXAM: CT HEAD WITHOUT  CONTRAST TECHNIQUE: Contiguous axial images were obtained from the base of the skull through the vertex without intravenous contrast. COMPARISON:  12/16/2019 FINDINGS: Brain: No evidence of acute infarction, hemorrhage, hydrocephalus, extra-axial collection or mass lesion/mass effect. Mild chronic white matter ischemic changes noted. Vascular: No hyperdense vessel or unexpected calcification. Skull: Normal. Negative for fracture or focal lesion. Sinuses/Orbits: No acute finding. Other: None. IMPRESSION: Mild chronic white matter ischemic changes stable from the prior exam. No acute abnormality noted. Electronically Signed   By: Inez Catalina M.D.   On: 01/23/2020 17:55   CT CHEST ABDOMEN PELVIS W CONTRAST  Result Date: 01/23/2020 CLINICAL DATA:  Shortness of breath and fever EXAM: CT CHEST, ABDOMEN, AND PELVIS WITH CONTRAST TECHNIQUE: Multidetector CT imaging of the chest, abdomen and pelvis was performed following the standard protocol during bolus administration of intravenous contrast. CONTRAST:  197mL OMNIPAQUE IOHEXOL 300 MG/ML  SOLN COMPARISON:  Chest x-ray from earlier in the same day, CT from 08/05/2011. FINDINGS: CT CHEST FINDINGS Cardiovascular: Thoracic aorta and its branches demonstrate atherosclerotic calcification. No aneurysmal dilatation or dissection is noted. No large central pulmonary embolus is noted. Cardiac shadow is within normal limits. Coronary calcifications are seen. Mediastinum/Nodes: Thoracic inlet is within normal limits. No sizable hilar or mediastinal adenopathy is noted. The esophagus as visualized is within normal limits. Lungs/Pleura: Mild emphysematous changes are seen. No focal confluent infiltrate is noted. Very mild basilar scarring is seen. 5 mm nodule is noted in the right lower lobe just above the hemidiaphragm stable in appearance from the prior exam. Small right middle lobe nodule is noted along the pleural margin anterior laterally also stable from the prior exam. No  new parenchymal nodules are seen. Musculoskeletal: Degenerative changes of the thoracic spine are noted. No acute bony abnormality is seen. CT ABDOMEN PELVIS FINDINGS Hepatobiliary: Mild fatty infiltration of the liver is noted. An area of focal fatty sparing is noted along the falciform ligament. Gallbladder has been surgically. Pancreas:  Pancreas is within normal limits. Spleen: Normal in size without focal abnormality. Adrenals/Urinary Tract: Adrenal glands are within normal limits bilaterally. Kidneys demonstrate Crew Goren normal enhancement pattern bilaterally. Mild perinephric stranding is noted similar to that seen on prior exam. Delayed images demonstrate normal excretion of contrast material. The bladder is well distended. Stomach/Bowel: Postsurgical changes are noted in the colon. No obstructive or inflammatory changes are noted. The appendix is not well visualized although no inflammatory changes to suggest appendicitis are noted. The small bowel and stomach are within normal limits. Vascular/Lymphatic: Aortic atherosclerosis. No enlarged abdominal or pelvic lymph nodes. Reproductive: Prostate is prominent measuring 7.6 cm in transverse dimension. Other: No abdominal wall hernia or abnormality. No abdominopelvic ascites. Musculoskeletal: Mild degenerative changes of the lumbar spine are noted.  DG Chest Port 1 View  Result Date: 01/23/2020 CLINICAL DATA:  Worsening shortness of breath and fever, fell yesterday, sepsis, lethargy, history asthma, COPD, diabetes mellitus, hypertension EXAM: PORTABLE CHEST 1 VIEW COMPARISON:  Portable exam 1237 hours compared to 12/14/2019 FINDINGS: Normal heart size, mediastinal contours, and pulmonary vascularity. Minimal RIGHT basilar atelectasis and LEFT basilar scarring unchanged. Lungs otherwise clear. No acute infiltrate, pleural effusion or pneumothorax. Scattered endplate spur formation thoracic spine. IMPRESSION: LEFT basilar scarring and minimal RIGHT basilar  atelectasis, unchanged. No acute abnormalities. Aortic Atherosclerosis (ICD10-I70.0). Electronically Signed   By: Lavonia Dana M.D.   On: 01/23/2020 12:46   ECHOCARDIOGRAM LIMITED  Result Date: 01/24/2020    ECHOCARDIOGRAM LIMITED REPORT   Patient Name:   GRIFFEN FRAYNE Date of Exam: 01/24/2020 Medical Rec #:  [id number]      Height:       71.0 in Accession #:    [id number]     Weight:       290.8 lb Date of Birth:  [birth year]      BSA:          2.472 m Patient Age:    3 years       BP:           not listed in chart/not listed in                                              chart mmHg Patient Gender: M              HR:           Not listed in chart bpm. Exam Location:  [facility] Procedure: Limited Echo, Color Doppler and Cardiac Doppler Indications:     Elevated troponin  History:         Patient has prior history of Echocardiogram examinations, most                  recent 12/16/2019. COPD; Risk Factors:Sleep Apnea, Hypertension                  and Diabetes. History of tobacco use.  Sonographer:     Sherrie Sport RDCS (AE) Referring Phys:  [phone] Barclay Lennox CALDWELL POWELL JR Diagnosing Phys: Kate Sable MD  Sonographer Comments: Technically difficult study due to poor echo windows, suboptimal parasternal window and no apical window. The best view was subcostal view. IMPRESSIONS  1. Left ventricular ejection fraction, by estimation, is 60 to 65%. The left ventricle has normal function. The left ventricle has no regional wall motion abnormalities. Left ventricular diastolic function could  not be evaluated.  2. Right ventricular systolic function is normal. The right ventricular size is normal.  3. The mitral valve is normal in structure. No evidence of mitral valve regurgitation. FINDINGS  Left Ventricle: Left ventricular ejection fraction, by estimation, is 60 to 65%. The left ventricle has normal function. The left ventricle has no regional wall motion abnormalities. The left ventricular internal cavity size was normal in  size. Left ventricular diastolic function could not be evaluated. Right Ventricle: The right ventricular size is normal. Right ventricular systolic function is normal. Pericardium: There is no evidence of pericardial effusion. Mitral Valve: The mitral valve is normal in structure. Tricuspid Valve: The tricuspid valve is normal in structure. Tricuspid valve regurgitation is not demonstrated. Pulmonic Valve: The pulmonic valve was not assessed. Aorta: The aortic root is normal in size and structure. Venous: The inferior vena cava was not well visualized. LEFT VENTRICLE PLAX 2D LVIDd:         4.25 cm LVIDs:         2.89 cm LV PW:         1.46 cm LV IVS:        1.44 cm LVOT diam:     2.00 cm LVOT Area:     3.14 cm  LEFT ATRIUM         Index LA diam:    3.00 cm 1.21 cm/m   AORTA Ao Root diam: 3.10 cm  SHUNTS Systemic Diam: 2.00 cm Kate Sable MD Electronically signed by Kate Sable MD Signature Date/Time: 01/24/2020/12:56:21 PM    Final         Scheduled Meds:  aspirin  324 mg Oral Daily   budesonide (PULMICORT) nebulizer solution  0.25 mg Nebulization BID   carvedilol  25 mg Oral BID WC   Chlorhexidine Gluconate Cloth  6 each Topical Daily   insulin aspart  0-9 Units Subcutaneous TID WC   insulin glargine  25 Units Subcutaneous QHS   irbesartan  300 mg Oral Daily   mouth rinse  15 mL Mouth Rinse BID   rosuvastatin  40 mg Oral QHS   sodium chloride flush  3 mL Intravenous Q12H   tamsulosin  0.4 mg Oral Daily   Continuous Infusions:  ceFEPime (MAXIPIME) IV 2 g (01/24/20 0501)   metronidazole 500 mg (01/24/20 1246)   vancomycin 1,000 mg (01/24/20 0542)     LOS: 1 day    Time spent: over 30 min    Fayrene Helper, MD [facility]   To contact the attending provider between 7A-7P or the covering provider during after hours 7P-7A, please log into the web site [email or web address] and access using universal [place] password for that web site. If you do not have  the password, please call the hospital operator.  01/24/2020, 1:30 PM

## 2020-01-24 NOTE — ED Notes (Signed)
Per order from MD Posey Pronto over Secure chat attempt to irrigate bladder manually in effort to relieve pt pain and promote urine drainage.

## 2020-01-24 NOTE — Evaluation (Signed)
Physical Therapy Evaluation Patient Details Name: Justin Griffith MRN: [id number] DOB: Oct 16, 1940 Today's Date: 01/24/2020   History of Present Illness  Justin Griffith is a 79 y.o. male with medical history significant for COPD, chronic respiratory failure with hypoxia on 3 L of home O2 via [place], CAD, PVD with chronic venous stasis dermatitis, history of CVA, T2DM, HTN, HLD, BPH, essential tremor, and OSA on CPAP who presents to the ED for evaluation of shortness of breath and fevers.     Clinical Impression  Pt received in supine position and agreeable to therapy.  Pt notes that he would like his catheter to be changed as it burns when he urinates, nursing notified.  Pt has delayed response when answering questions, however is able to eventually answer them.  Pt notes that he does not have someone at home that could care for him if needed, due to his wife having Alzheimer's.  Pt was able to perform bed-level exercises as listed below.  Pt does have difficulty with hip flexion for most exercises and consistently externally rotates the hip in order to engage larger muscle groups to perform exercises.  Pt is able to perform correctly when given verbal cuing, but does not have good carryover as therapy progresses.  Pt then performed bed mobility with use of handrails in order to come upright.  Pt proceeded to stand and did so with good strength.  Pt performed marching and weight shifts in standing before returning to seated position for therapist to set up recliner and add extension to nasal cannula.  Nursing assisted in making sure pt was safe sitting at EOB.  Pt then ambulated within hospital room to recliner where pt was left with all needs met and call bell within arms reach.  Nursing in room to flush IV and was notified of mobility status.  Pt will benefit from skilled PT intervention to increase independence and safety with basic mobility in preparation for discharge to the venue listed below.        Follow Up Recommendations SNF    Equipment Recommendations  Rolling walker with 5" wheels;3in1 (PT)    Recommendations for Other Services       Precautions / Restrictions Precautions Precautions: Fall Restrictions Weight Bearing Restrictions: No      Mobility  Bed Mobility Overal bed mobility: Needs Assistance Bed Mobility: Supine to Sit     Supine to sit: Min assist     General bed mobility comments: Pt requires HOB to be elevated and uses bedrails heavily to pull up from.    Transfers Overall transfer level: Needs assistance Equipment used: Rolling walker (2 wheeled) Transfers: Sit to/from Stand Sit to Stand: Min guard         General transfer comment: Pt able to come upright with CGA and use of FWW.  Ambulation/Gait Ambulation/Gait assistance: Supervision Gait Distance (Feet): 6 Feet Assistive device: Rolling walker (2 wheeled) Gait Pattern/deviations: Step-through pattern Gait velocity: decreased   General Gait Details: Pt ambulate fairly well, has abducted and externally rotated LEs when ambulating.  Pt does use FWW well and had good weight distribution throughout ambulation to the recliner.  Stairs            Wheelchair Mobility    Modified Rankin (Stroke Patients Only)       Balance Overall balance assessment: Needs assistance Sitting-balance support: Feet supported Sitting balance-Leahy Scale: Good     Standing balance support: Bilateral upper extremity supported Standing balance-Leahy Scale: Fair Standing balance  comment: Pt requires use of BUE to remain upright.                             Pertinent Vitals/Pain Pain Assessment: Faces Faces Pain Scale: Hurts little more Pain Location: Pt notes burning pain when urinating. Pain Descriptors / Indicators: Burning Pain Intervention(s): Limited activity within patient's tolerance;Monitored during session;Repositioned    Home Living Family/patient expects to be  discharged to:: Skilled nursing facility Living Arrangements: Spouse/significant other               Additional Comments: 8 falls endorsed over the past year.    Prior Function Level of Independence: Independent               Hand Dominance   Dominant Hand: Right    Extremity/Trunk Assessment   Upper Extremity Assessment Upper Extremity Assessment: Overall WFL for tasks assessed    Lower Extremity Assessment Lower Extremity Assessment: Generalized weakness    Cervical / Trunk Assessment Cervical / Trunk Assessment: Normal  Communication   Communication: HOH  Cognition Arousal/Alertness: Awake/alert Behavior During Therapy: WFL for tasks assessed/performed Overall Cognitive Status: Within Functional Limits for tasks assessed                                        General Comments      Exercises Total Joint Exercises Ankle Circles/Pumps: AROM;Strengthening;Both;10 reps;Supine Quad Sets: AROM;Strengthening;Both;10 reps;Supine Gluteal Sets: AROM;Strengthening;Both;10 reps;Supine Heel Slides: AROM;Strengthening;Both;10 reps;Supine Hip ABduction/ADduction: AROM;Strengthening;Both;10 reps;Supine Straight Leg Raises: AROM;Strengthening;Both;10 reps;Supine Long Arc Quad: AROM;Strengthening;Both;10 reps;Seated Marching in Standing: AROM;Strengthening;Both;10 reps;Standing Other Exercises Other Exercises: Pt educate on role of PT and services provided during hospital stay. Other Exercises: Pt educated on physiological response/benefits of performing exercises throughout the day.   Assessment/Plan    PT Assessment Patient needs continued PT services  PT Problem List Decreased strength;Decreased range of motion;Decreased activity tolerance;Decreased balance;Decreased mobility;Decreased knowledge of use of DME;Decreased safety awareness;Impaired sensation;Obesity       PT Treatment Interventions      PT Goals (Current goals can be found in the  Care Plan section)  Acute Rehab PT Goals Patient Stated Goal: Pt wants to be able to walk better and go home. PT Goal Formulation: With patient Time For Goal Achievement: 02/07/20 Potential to Achieve Goals: Fair    Frequency Min 2X/week   Barriers to discharge Decreased caregiver support Pt reports his wife has Alzheimers and is unable to care for him.  Pt has daughter that can assist, but only intermittently.    Co-evaluation               AM-PAC PT "6 Clicks" Mobility  Outcome Measure Help needed turning from your back to your side while in a flat bed without using bedrails?: A Little Help needed moving from lying on your back to sitting on the side of a flat bed without using bedrails?: A Little Help needed moving to and from a bed to a chair (including a wheelchair)?: A Little Help needed standing up from a chair using your arms (e.g., wheelchair or bedside chair)?: A Little Help needed to walk in hospital room?: A Lot Help needed climbing 3-5 steps with a railing? : A Lot 6 Click Score: 16    End of Session Equipment Utilized During Treatment: Gait belt Activity Tolerance: Patient tolerated treatment well Patient left: in  chair;with call bell/phone within reach;with chair alarm set;with nursing/sitter in room Nurse Communication: Mobility status;Other (comment) (Pt requesting catheter to be changed and nursing notified.) PT Visit Diagnosis: Unsteadiness on feet (R26.81);Other abnormalities of gait and mobility (R26.89);Repeated falls (R29.6);Muscle weakness (generalized) (M62.81);History of falling (Z91.81);Difficulty in walking, not elsewhere classified (R26.2)    Time: 6394-3200 PT Time Calculation (min) (ACUTE ONLY): 30 min   Charges:   PT Evaluation $PT Eval Low Complexity: 1 Low PT Treatments $Therapeutic Exercise: 23-37 mins        Gwenlyn Saran, PT, DPT 01/24/20, 10:53 AM

## 2020-01-25 ENCOUNTER — Ambulatory Visit: Payer: Medicare Other | Admitting: Physical Therapy

## 2020-01-25 DIAGNOSIS — G9340 Encephalopathy, unspecified: Secondary | ICD-10-CM

## 2020-01-25 DIAGNOSIS — R6510 Systemic inflammatory response syndrome (SIRS) of non-infectious origin without acute organ dysfunction: Secondary | ICD-10-CM | POA: Diagnosis not present

## 2020-01-25 LAB — PHOSPHORUS: Phosphorus: 2.2 mg/dL — ABNORMAL LOW (ref 2.5–4.6)

## 2020-01-25 LAB — COMPREHENSIVE METABOLIC PANEL
ALT: 15 U/L (ref 0–44)
AST: 21 U/L (ref 15–41)
Albumin: 2.6 g/dL — ABNORMAL LOW (ref 3.5–5.0)
Alkaline Phosphatase: 52 U/L (ref 38–126)
Anion gap: 7 (ref 5–15)
BUN: 24 mg/dL — ABNORMAL HIGH (ref 8–23)
CO2: 27 mmol/L (ref 22–32)
Calcium: 9 mg/dL (ref 8.9–10.3)
Chloride: 98 mmol/L (ref 98–111)
Creatinine, Ser: 1.17 mg/dL (ref 0.61–1.24)
GFR, Estimated: >60 mL/min (ref >60)
Glucose, Bld: 146 mg/dL — ABNORMAL HIGH (ref 70–99)
Potassium: 3.7 mmol/L (ref 3.5–5.1)
Sodium: 132 mmol/L — ABNORMAL LOW (ref 135–145)
Total Bilirubin: 1.1 mg/dL (ref 0.3–1.2)
Total Protein: 6 g/dL — ABNORMAL LOW (ref 6.5–8.1)

## 2020-01-25 LAB — RESPIRATORY PANEL BY PCR

## 2020-01-25 LAB — CBC WITH DIFFERENTIAL/PLATELET
Abs Immature Granulocytes: 0.08 10*3/uL — ABNORMAL HIGH (ref 0.00–0.07)
Basophils Absolute: 0 10*3/uL (ref 0.0–0.1)
Basophils Relative: 0 %
Eosinophils Absolute: 0 10*3/uL (ref 0.0–0.5)
Eosinophils Relative: 0 %
HCT: 37.4 % — ABNORMAL LOW (ref 39.0–52.0)
Hemoglobin: 13 g/dL (ref 13.0–17.0)
Immature Granulocytes: 1 %
Lymphocytes Relative: 8 %
Lymphs Abs: 0.7 10*3/uL (ref 0.7–4.0)
MCH: 31.4 pg (ref 26.0–34.0)
MCHC: 34.8 g/dL (ref 30.0–36.0)
MCV: 90.3 fL (ref 80.0–100.0)
Monocytes Absolute: 0.8 10*3/uL (ref 0.1–1.0)
Monocytes Relative: 10 %
Neutro Abs: 6.8 10*3/uL (ref 1.7–7.7)
Neutrophils Relative %: 81 %
Platelets: 169 10*3/uL (ref 150–400)
RBC: 4.14 MIL/uL — ABNORMAL LOW (ref 4.22–5.81)
RDW: 12.6 % (ref 11.5–15.5)
WBC: 8.5 10*3/uL (ref 4.0–10.5)
nRBC: 0 % (ref 0.0–0.2)

## 2020-01-25 LAB — GLUCOSE, CAPILLARY
Glucose-Capillary: 133 mg/dL — ABNORMAL HIGH (ref 70–99)
Glucose-Capillary: 182 mg/dL — ABNORMAL HIGH (ref 70–99)
Glucose-Capillary: 172 mg/dL — ABNORMAL HIGH (ref 70–99)
Glucose-Capillary: 164 mg/dL — ABNORMAL HIGH (ref 70–99)

## 2020-01-25 LAB — MAGNESIUM: Magnesium: 1.9 mg/dL (ref 1.7–2.4)

## 2020-01-25 MED ORDER — METRONIDAZOLE 500 MG PO TABS
500.0000 mg | ORAL_TABLET | Freq: Three times a day (TID) | ORAL | Status: DC
  Filled 2020-01-25: qty 1

## 2020-01-25 MED ORDER — SODIUM CHLORIDE 0.9 % IV SOLN
2.0000 g | INTRAVENOUS | Status: DC
  Administered 2020-01-25 – 2020-01-28 (×4): 2 g via INTRAVENOUS
  Filled 2020-01-25 (×2): qty 20
  Filled 2020-01-25: qty 2
  Filled 2020-01-25 (×3): qty 20

## 2020-01-25 MED ORDER — IPRATROPIUM-ALBUTEROL 0.5-2.5 (3) MG/3ML IN SOLN
3.0000 mL | Freq: Three times a day (TID) | RESPIRATORY_TRACT | Status: DC
  Administered 2020-01-25 – 2020-01-29 (×11): 3 mL via RESPIRATORY_TRACT
  Filled 2020-01-25 (×13): qty 3

## 2020-01-25 MED ORDER — METRONIDAZOLE 500 MG PO TABS
500.0000 mg | ORAL_TABLET | Freq: Three times a day (TID) | ORAL | Status: DC
  Administered 2020-01-25 – 2020-01-26 (×3): 500 mg via ORAL
  Filled 2020-01-25 (×5): qty 1

## 2020-01-25 NOTE — Progress Notes (Signed)
PROGRESS NOTE    Justin Griffith  STM:[id number] DOB: [birth year] DOA: 01/23/2020 PCP: Patient, No Pcp Per   Chief Complaint  Patient presents with  . Shortness of Breath    Brief Narrative:  Justin Griffith is a 79 y.o. male with medical history significant for COPD, chronic respiratory failure with hypoxia on 3 L of home O2 via [place], CAD, PVD with chronic venous stasis dermatitis, history of CVA, T2DM, HTN, HLD, BPH, essential tremor, and OSA on CPAP who is admitted with sepsis without a clear source.  Assessment & Plan:   Principal Problem:   Sepsis ([place]) Active Problems:   Hypertension associated with diabetes ([place])   Type 2 diabetes mellitus with proteinuria (HCC)   Hyperlipidemia associated with type 2 diabetes mellitus (HCC)   Sleep apnea   BPH (benign prostatic hyperplasia)   COPD (chronic obstructive pulmonary disease) (HCC)   Elevated troponin   Coronary artery disease   History of CVA (cerebrovascular accident)   Gross hematuria  SIRS with unspecified source: Patient presenting on arrival with fever, tachycardia, tachypnea, and leukocytosis.  No obvious infectious source found at this time. CT chest/abdomen/pelvis without acute abnormalities - mild perinephric stranding (see report). Urinalysis with rare bacteria microscopy. SARS-CoV-2 and influenza A/B PCR's are negative.  -started on empiric antibiotics with IV vancomycin, cefepime, Flagyl  --MRSA screen neg, vanc d/c'ed --procal 0.87 PLAN: --due to possible acute encephalopathy, cefepime changed to ceftriaxone --cont flagyl for now --Order LP, however, could not be performed today, will do tomorrow  Somnolence, possible acute encephalopathy --Pt could barely stay awake.  Not on sedating medications.  VBG without hypercarbia.  Ammonia 19.  Follow B12, TSH ([facility]).  PLAN: --Order LP, however, could not be performed today, will do tomorrow  elevated troponin 2/2 demand ischemia ACS ruled out Hx of  CAD High-sensitivity troponin initially elevated 387 with downward trend on repeat 355. Patient does have history of CAD. He denies any recent chest pain.  Suspect demand ischemia in setting of possible sepsis. - echo without RWMA, EF 60-65% PLAN: --cardiology consult today --cont coreg, irbesartan --cont statin  BPH with gross hematuria: --urology consulted.  Hematuria suspected 2/2 foley trauma --foley d/c'ed PLAN: --cont flomax --Monitor urine output  Falls at home/generalized weakness: Reports multiple falls at home with no significant injury. -Request PT/OT eval -Continue fall precautions  COPD chronic respiratory failure with hypoxia on home 3L  Stable without acute exacerbation. --cont Pulmicort neb and DuoNeb --cont home suppl O2  Hypomagnesemia: --Monitor and replete PRN  History of CVA: Punctate acute 3 mm acute ischemic nonhemorrhagic cortical infarct involving the right occipital lobe noted on last admission with MRI brain 12/14/2019. -Continue aspirin and statin  Type 2 diabetes: A1c 7.5 on 12/15/2019.   --cont Lantus 25u nightly --SSI  Hypertension: BP improved -Continue Coreg 25 mg twice daily -Continue olmesartan 40 mg daily (switched to irbesartan per pharmacy formulary while in hospital) -Holding home Lasix for now -DC losartan from home meds -Hold clonidine, not clear if he has been taking this regularly  Hyperlipidemia: Continue rosuvastatin.  OSA: Continue CPAP nightly.   DVT prophylaxis: SCD Code Status: full  Family Communication:  Disposition:   Status is: Inpatient  Remains inpatient appropriate because:Inpatient level of care appropriate due to severity of illness   Dispo: The patient is from: Home              Anticipated d/c is to: Home  Anticipated d/c date is: > 3 days              Patient currently is not medically stable to d/c.  Somnolence, LP pending.  SIRS on empiric abx.   Consultants:    none  Procedures:  Echo IMPRESSIONS    1. Left ventricular ejection fraction, by estimation, is 60 to 65%. The  left ventricle has normal function. The left ventricle has no regional  wall motion abnormalities. Left ventricular diastolic function could not  be evaluated.  2. Right ventricular systolic function is normal. The right ventricular  size is normal.  3. The mitral valve is normal in structure. No evidence of mitral valve  regurgitation  Antimicrobials:  Anti-infectives (From admission, onward)   Start     Dose/Rate Route Frequency Ordered Stop   01/26/20 0800  metroNIDAZOLE (FLAGYL) tablet 500 mg        500 mg Oral Every 8 hours 01/25/20 1506     01/24/20 0600  vancomycin (VANCOCIN) IVPB 1000 mg/200 mL premix  Status:  Discontinued        1,000 mg 200 mL/hr over 60 Minutes Intravenous Every 12 hours 01/23/20 2010 01/24/20 1917   01/23/20 2200  ceFEPIme (MAXIPIME) 2 g in sodium chloride 0.9 % 100 mL IVPB        2 g 200 mL/hr over 30 Minutes Intravenous Every 8 hours 01/23/20 2010     01/23/20 2000  metroNIDAZOLE (FLAGYL) IVPB 500 mg        500 mg 100 mL/hr over 60 Minutes Intravenous Every 8 hours 01/23/20 1941 01/26/20 0359   01/23/20 1300  vancomycin (VANCOCIN) IVPB 1000 mg/200 mL premix       "Followed by" Linked Group Details   1,000 mg 200 mL/hr over 60 Minutes Intravenous  Once 01/23/20 1250 01/23/20 1459   01/23/20 1300  vancomycin (VANCOREADY) IVPB 1500 mg/300 mL       "Followed by" Linked Group Details   1,500 mg 150 mL/hr over 120 Minutes Intravenous  Once 01/23/20 1250 01/23/20 1804   01/23/20 1230  vancomycin (VANCOCIN) IVPB 1000 mg/200 mL premix  Status:  Discontinued        1,000 mg 200 mL/hr over 60 Minutes Intravenous  Once 01/23/20 1227 01/23/20 1248   01/23/20 1230  ceFEPIme (MAXIPIME) 2 g in sodium chloride 0.9 % 100 mL IVPB        2 g 200 mL/hr over 30 Minutes Intravenous  Once 01/23/20 1227 01/23/20 1355         Subjective: Pt was  very somnolent today.  Barely woke up to answer a few questions, but couldn't stay awake.   Denied dyspnea.  Michela Pitcher he came to the hospital because he fell and had 2 small seizures.   Objective: Vitals:   01/25/20 0811 01/25/20 1215 01/25/20 1436 01/25/20 1500  BP:  136/63  138/79  Pulse: 83 62 63 65  Resp: 16 17 (!) 24 20  Temp:  97.9 F (36.6 C)  98.3 F (36.8 C)  TempSrc:  Oral  Oral  SpO2: 95% 94% 95% 95%  Weight:      Height:        Intake/Output Summary (Last 24 hours) at 01/25/2020 1622 Last data filed at 01/25/2020 1215 Gross per 24 hour  Intake 1523 ml  Output 675 ml  Net 848 ml   Filed Weights   01/24/20 0052 01/24/20 0415 01/25/20 0157  Weight: 133.4 kg 131.9 kg (!) 137.1 kg  Examination:  Constitutional: NAD, somnolent, barely arousable CV: No cyanosis.   RESP: normal respiratory effort, on 2L Extremities: No effusions, edema in BLE SKIN: warm, dry and intact Very dark urine   Data Reviewed: I have personally reviewed following labs and imaging studies  CBC: Recent Labs  Lab 01/23/20 1617 01/24/20 0431 01/25/20 0435  WBC 13.7* 15.3* 8.5  NEUTROABS 11.7*  --  6.8  HGB 15.4 13.7 13.0  HCT 45.3 39.8 37.4*  MCV 91.0 90.9 90.3  PLT 219 190 811    Basic Metabolic Panel: Recent Labs  Lab 01/23/20 1617 01/24/20 0431 01/25/20 0435  NA 137 133* 132*  K 3.9 3.7 3.7  CL 98 98 98  CO2 25 28 27   GLUCOSE 122* 180* 146*  BUN 20 18 24*  CREATININE 1.06 1.12 1.17  CALCIUM 9.8 9.4 9.0  MG 1.6* 1.9 1.9  PHOS  --   --  2.2*    GFR: Estimated Creatinine Clearance: 71.2 mL/min (by C-G formula based on SCr of 1.17 mg/dL).  Liver Function Tests: Recent Labs  Lab 01/23/20 1617 01/25/20 0435  AST 31 21  ALT 17 15  ALKPHOS 52 52  BILITOT 1.7* 1.1  PROT 7.1 6.0*  ALBUMIN 3.4* 2.6*    CBG: Recent Labs  Lab 01/24/20 1611 01/24/20 2009 01/25/20 0746 01/25/20 1211 01/25/20 1619  GLUCAP 172* 192* 133* 182* 172*     Recent Results (from  the past 240 hour(s))  Urine culture     Status: None   Collection Time: 01/23/20 12:26 PM   Specimen: Urine, Random  Result Value Ref Range Status   Specimen Description   Final    URINE, RANDOM Performed at [facility], [address] [place], [place] [address]    Special Requests   Final    NONE Performed at [facility], [address], [place], [place] [address]    Culture   Final    NO GROWTH Performed at [facility], [place] [address], [place], [place] [address]    Report Status 01/24/2020 FINAL  Final  Respiratory Panel by RT PCR (Flu A&B, Covid) - Nasopharyngeal Swab     Status: None   Collection Time: 01/23/20  1:02 PM   Specimen: Nasopharyngeal Swab  Result Value Ref Range Status   SARS Coronavirus 2 by RT PCR NEGATIVE NEGATIVE Final    Comment: (NOTE) SARS-CoV-2 target nucleic acids are NOT DETECTED.  The SARS-CoV-2 RNA is generally detectable in upper respiratoy specimens during the acute phase of infection. The lowest concentration of SARS-CoV-2 viral copies this assay can detect is 131 copies/mL. A negative result does not preclude SARS-Cov-2 infection and should not be used as the sole basis for treatment or other patient management decisions. A negative result may occur with  improper specimen collection/handling, submission of specimen other than nasopharyngeal swab, presence of viral mutation(s) within the areas targeted by this assay, and inadequate number of viral copies (<131 copies/mL). A negative result must be combined with clinical observations, patient history, and epidemiological information. The expected result is Negative.  Fact Sheet for Patients:  PinkCheek.be  Fact Sheet for Healthcare Providers:  GravelBags.it  This test is no t yet approved or cleared by the [place] FDA and  has been authorized for detection and/or diagnosis of SARS-CoV-2 by FDA  under an Emergency Use Authorization (EUA). This EUA will remain  in effect (meaning this test can be used) for the duration of the COVID-19 declaration under  Section 564(b)(1) of the Act, 21 U.S.C. section 360bbb-3(b)(1), unless the authorization is terminated or revoked sooner.     Influenza A by PCR NEGATIVE NEGATIVE Final   Influenza B by PCR NEGATIVE NEGATIVE Final    Comment: (NOTE) The Xpert Xpress SARS-CoV-2/FLU/RSV assay is intended as an aid in  the diagnosis of influenza from Nasopharyngeal swab specimens and  should not be used as a sole basis for treatment. Nasal washings and  aspirates are unacceptable for Xpert Xpress SARS-CoV-2/FLU/RSV  testing.  Fact Sheet for Patients: PinkCheek.be  Fact Sheet for Healthcare Providers: GravelBags.it  This test is not yet approved or cleared by the [place] FDA and  has been authorized for detection and/or diagnosis of SARS-CoV-2 by  FDA under an Emergency Use Authorization (EUA). This EUA will remain  in effect (meaning this test can be used) for the duration of the  Covid-19 declaration under Section 564(b)(1) of the Act, 21  U.S.C. section 360bbb-3(b)(1), unless the authorization is  terminated or revoked. Performed at [facility], [address], [place], [place] [address]   Blood Culture (routine x 2)     Status: None (Preliminary result)   Collection Time: 01/23/20  4:16 PM   Specimen: BLOOD  Result Value Ref Range Status   Specimen Description BLOOD BLOOD LEFT HAND  Final   Special Requests   Final    BOTTLES DRAWN AEROBIC ONLY Blood Culture results may not be optimal due to an inadequate volume of blood received in culture bottles   Culture   Final    NO GROWTH 2 DAYS Performed at [facility], [address], [place], [place] [address]    Report Status PENDING  Incomplete  Blood Culture (routine x 2)     Status: None (Preliminary  result)   Collection Time: 01/23/20  4:16 PM   Specimen: BLOOD  Result Value Ref Range Status   Specimen Description BLOOD BLOOD RIGHT HAND  Final   Special Requests   Final    BOTTLES DRAWN AEROBIC AND ANAEROBIC Blood Culture adequate volume   Culture   Final    NO GROWTH 2 DAYS Performed at [facility], [address] [place], [place] [address]    Report Status PENDING  Incomplete  MRSA PCR Screening     Status: None   Collection Time: 01/24/20  5:06 PM   Specimen: Nasal Mucosa; Nasopharyngeal  Result Value Ref Range Status   MRSA by PCR NEGATIVE NEGATIVE Final    Comment:        The GeneXpert MRSA Assay (FDA approved for NASAL specimens only), is one component of a comprehensive MRSA colonization surveillance program. It is not intended to diagnose MRSA infection nor to guide or monitor treatment for MRSA infections. Performed at [facility], [address] [place], [place] [address]          Radiology Studies: CT Head Wo Contrast  Result Date: 01/23/2020 CLINICAL DATA:  Altered mental status EXAM: CT HEAD WITHOUT CONTRAST TECHNIQUE: Contiguous axial images were obtained from the base of the skull through the vertex without intravenous contrast. COMPARISON:  12/16/2019 FINDINGS: Brain: No evidence of acute infarction, hemorrhage, hydrocephalus, extra-axial collection or mass lesion/mass effect. Mild chronic white matter ischemic changes noted. Vascular: No hyperdense vessel or unexpected calcification. Skull: Normal. Negative for fracture or focal lesion. Sinuses/Orbits: No acute finding. Other: None. IMPRESSION: Mild chronic white matter ischemic changes stable from the prior exam. No acute abnormality noted. Electronically Signed  By: Inez Catalina M.D.   On: 01/23/2020 17:55   CT CHEST ABDOMEN PELVIS W CONTRAST  Result Date: 01/23/2020 CLINICAL DATA:  Shortness of breath and fever EXAM: CT CHEST, ABDOMEN, AND PELVIS WITH CONTRAST TECHNIQUE:  Multidetector CT imaging of the chest, abdomen and pelvis was performed following the standard protocol during bolus administration of intravenous contrast. CONTRAST:  167mL OMNIPAQUE IOHEXOL 300 MG/ML  SOLN COMPARISON:  Chest x-ray from earlier in the same day, CT from 08/05/2011. FINDINGS: CT CHEST FINDINGS Cardiovascular: Thoracic aorta and its branches demonstrate atherosclerotic calcification. No aneurysmal dilatation or dissection is noted. No large central pulmonary embolus is noted. Cardiac shadow is within normal limits. Coronary calcifications are seen. Mediastinum/Nodes: Thoracic inlet is within normal limits. No sizable hilar or mediastinal adenopathy is noted. The esophagus as visualized is within normal limits. Lungs/Pleura: Mild emphysematous changes are seen. No focal confluent infiltrate is noted. Very mild basilar scarring is seen. 5 mm nodule is noted in the right lower lobe just above the hemidiaphragm stable in appearance from the prior exam. Small right middle lobe nodule is noted along the pleural margin anterior laterally also stable from the prior exam. No new parenchymal nodules are seen. Musculoskeletal: Degenerative changes of the thoracic spine are noted. No acute bony abnormality is seen. CT ABDOMEN PELVIS FINDINGS Hepatobiliary: Mild fatty infiltration of the liver is noted. An area of focal fatty sparing is noted along the falciform ligament. Gallbladder has been surgically. Pancreas: Pancreas is within normal limits. Spleen: Normal in size without focal abnormality. Adrenals/Urinary Tract: Adrenal glands are within normal limits bilaterally. Kidneys demonstrate a normal enhancement pattern bilaterally. Mild perinephric stranding is noted similar to that seen on prior exam. Delayed images demonstrate normal excretion of contrast material. The bladder is well distended. Stomach/Bowel: Postsurgical changes are noted in the colon. No obstructive or inflammatory changes are noted. The  appendix is not well visualized although no inflammatory changes to suggest appendicitis are noted. The small bowel and stomach are within normal limits. Vascular/Lymphatic: Aortic atherosclerosis. No enlarged abdominal or pelvic lymph nodes. Reproductive: Prostate is prominent measuring 7.6 cm in transverse dimension. Other: No abdominal wall hernia or abnormality. No abdominopelvic ascites. Musculoskeletal: Mild degenerative changes of the lumbar spine are noted.  ECHOCARDIOGRAM LIMITED  Result Date: 01/24/2020    ECHOCARDIOGRAM LIMITED REPORT   Patient Name:   ZYEN TRIGGS [facility] Date of Exam: 01/24/2020 Medical Rec #:  [id number]      Height:       71.0 in Accession #:    [id number]     Weight:       290.8 lb Date of Birth:  06/23/1940      BSA:          2.472 m Patient Age:    64 years       BP:           not listed in chart/not listed in                                              chart mmHg Patient Gender: M              HR:           Not listed in chart bpm. Exam Location:  [facility] Procedure: Limited Echo, Color Doppler and Cardiac Doppler Indications:     Elevated troponin  History:         Patient has prior history of Echocardiogram examinations, most                  recent 12/16/2019. COPD; Risk Factors:Sleep Apnea, Hypertension                  and Diabetes. History of tobacco use.  Sonographer:     Sherrie Sport RDCS (AE) Referring Phys:  [phone] A CALDWELL POWELL JR Diagnosing Phys: Kate Sable MD  Sonographer Comments: Technically difficult study due to poor echo windows, suboptimal parasternal window and no apical window. The best view was subcostal view. IMPRESSIONS  1. Left ventricular ejection fraction, by estimation, is 60 to 65%. The left ventricle has normal function. The left ventricle has no regional wall motion abnormalities. Left ventricular diastolic function could not be evaluated.  2. Right ventricular systolic function is normal. The right ventricular size is normal.  3. The mitral valve  is normal in structure. No evidence of mitral valve regurgitation. FINDINGS  Left Ventricle: Left ventricular ejection fraction, by estimation, is 60 to 65%. The left ventricle has normal function. The left ventricle has no regional wall motion abnormalities. The left ventricular internal cavity size was normal in size. Left ventricular diastolic function could not be evaluated. Right Ventricle: The right ventricular size is normal. Right ventricular systolic function is normal. Pericardium: There is no evidence of pericardial effusion. Mitral Valve: The mitral valve is normal in structure. Tricuspid Valve: The tricuspid valve is normal in structure. Tricuspid valve regurgitation is not demonstrated. Pulmonic Valve: The pulmonic valve was not assessed. Aorta: The aortic root is normal in size and structure. Venous: The inferior vena cava was not well visualized. LEFT VENTRICLE PLAX 2D LVIDd:         4.25 cm LVIDs:         2.89 cm LV PW:         1.46 cm LV IVS:        1.44 cm LVOT diam:     2.00 cm LVOT Area:     3.14 cm  LEFT ATRIUM         Index LA diam:    3.00 cm 1.21 cm/m   AORTA Ao Root diam: 3.10 cm  SHUNTS Systemic Diam: 2.00 cm Kate Sable MD Electronically signed by Kate Sable MD Signature Date/Time: 01/24/2020/12:56:21 PM    Final         Scheduled Meds: . aspirin  324 mg Oral Daily  . budesonide (PULMICORT) nebulizer solution  0.25 mg Nebulization BID  . carvedilol  25 mg Oral BID WC  . Chlorhexidine Gluconate Cloth  6 each Topical Daily  . insulin aspart  0-9 Units Subcutaneous TID WC  . insulin glargine  25 Units Subcutaneous QHS  . ipratropium-albuterol  3 mL Nebulization TID  . irbesartan  300 mg Oral Daily  . mouth rinse  15 mL Mouth Rinse BID  . [START ON 01/26/2020] metroNIDAZOLE  500 mg Oral Q8H  . rosuvastatin  40 mg Oral QHS  . sodium chloride flush  3 mL Intravenous Q12H  . tamsulosin  0.4 mg Oral Daily   Continuous Infusions: . ceFEPime (MAXIPIME) IV 2 g  (01/25/20 0534)  . metronidazole 500 mg (01/25/20 1252)     LOS: 2 days     Enzo Bi, MD [facility]   To contact the attending provider between 7A-7P or the covering provider during after hours 7P-7A, please log into the  web site [email or web address] and access using universal [place] password for that web site. If you do not have the password, please call the hospital operator.  01/25/2020, 4:22 PM

## 2020-01-25 NOTE — Plan of Care (Signed)

## 2020-01-25 NOTE — Plan of Care (Signed)
  Problem: Education: Goal: Knowledge of General Education information will improve Description Including pain rating scale, medication(s)/side effects and non-pharmacologic comfort measures Outcome: Progressing   

## 2020-01-25 NOTE — Consult Note (Signed)
[facility] Consultation Note  Patient ID: Justin Griffith, MRN: [id number], DOB/AGE: Sep 08, 1940 80 y.o. Admit date: 01/23/2020   Date of Consult: 01/25/2020 Primary Physician: Patient, No Pcp Per Primary Cardiologist: [place]  Chief Complaint:  Chief Complaint  Patient presents with  . Shortness of Breath   Reason for Consult: Elevated troponin with known coronary artery disease hypertension hyperlipidemia  HPI: 79 y.o. male with known coronary artery disease by cardiac catheterization in 2017 showing a 50% left anterior descending artery atherosclerosis.  At that time the patient had no evidence of worsening symptoms and was placed on appropriate medication management listed below.  The patient has been apparently compliant with these medication management and doing relatively well.  There is been no reports of episodes of chest discomfort or congestive heart failure in his recent past although the patient is very and mobile.  He has significant lower extremity edema with venous stasis and mainly stays in the chair.  He has severe COPD with sleep apnea and has apparently been admitted for altered mental status, fall, lower extremity feeling funny, and recently has been having falls and abnormal CT scan of the brain in the past.  He was sent home but still has had continued issues with mental status changes and possible sepsis at this time.  The patient has had an echocardiogram showing normal LV systolic function with ejection fraction of 60% and no evidence of significant valvular heart disease.  EKG shows sinus tachycardia with poor R wave progression unchanged from before.  Patient also has had a chest x-ray showing some atelectasis but no evidence of primary issue with infection.  Troponin levels have been 385, 355, and 144,.  The patient appears with these troponin levels to have had some type of cardiac injury which appears to be demand ischemia at this time with no evidence of  acute coronary syndrome by EKG or by history.  The patient though may need further work-up depending on how the patient feels.  The patient therefore needs further medication management from the cardiovascular standpoint while he is recovering from multiple other concerns and mental status changes with hypoxia  Past Medical History:  Diagnosis Date  . Arthritis   . Asthma 2000  . Back pain   . Colon polyp 2012  . COPD (chronic obstructive pulmonary disease) ([place])   . Diabetes mellitus without complication ([place])   . Emphysema of lung ([place])   . Heart murmur   . Hernia 2000  . Hyperlipidemia   . Hypertension   . Personal history of malignant neoplasm of large intestine   . Personal history of tobacco use, presenting hazards to health   . Sleep apnea   . Special screening for malignant neoplasms, colon       Surgical History:  Past Surgical History:  Procedure Laterality Date  . CARDIAC CATHETERIZATION Left 09/25/2015   Procedure: Left Heart Cath and Coronary Angiography;  Surgeon: Yolonda Kida, MD;  Location: [facility];  Service: Cardiovascular;  Laterality: Left;  . CHOLECYSTECTOMY  1970  . COLON SURGERY  07-16-99   sigmoid colon resection with primary anastomosis, chemotherapy for metastatic disease  . COLONOSCOPY  2001, 2012   Dr Bary Castilla, tubular adenoma of the cecum and ascending colon in 2012.  Marland Kitchen COLONOSCOPY WITH PROPOFOL N/A 02/12/2016   Procedure: COLONOSCOPY WITH PROPOFOL;  Surgeon: Robert Bellow, MD;  Location: [facility] END[facility]COPY;  Service: Endoscopy;  Laterality: N/A;  . ESOPHAGOGASTRODUODENOSCOPY (EGD) WITH PROPOFOL N/A 02/12/2016  Procedure: ESOPHAGOGASTRODUODENOSCOPY (EGD) WITH PROPOFOL;  Surgeon: Robert Bellow, MD;  Location: [facility] END[facility]COPY;  Service: Endoscopy;  Laterality: N/A;  . HERNIA REPAIR Right    right inguinal hernia repair  . JOINT REPLACEMENT Bilateral    knee replacement  . KNEE SURGERY Bilateral 2010  . MYRINGOTOMY WITH TUBE PLACEMENT  Bilateral 08/16/2014   Procedure: MYRINGOTOMY WITH TUBE PLACEMENT;  Surgeon: Carloyn Manner, MD;  Location: [facility] ORS;  Service: ENT;  Laterality: Bilateral;  . PERIPHERAL VASCULAR CATHETERIZATION Right 09/04/2015   Procedure: Lower Extremity Angiography;  Surgeon: Katha Cabal, MD;  Location: [facility];  Service: Cardiovascular;  Laterality: Right;  . PERIPHERAL VASCULAR CATHETERIZATION  09/04/2015   Procedure: Lower Extremity Intervention;  Surgeon: Katha Cabal, MD;  Location: [facility];  Service: Cardiovascular;;  . TONSILLECTOMY    . TYMPANOSTOMY TUBE PLACEMENT       Home Meds: Prior to Admission medications   Medication Sig Start Date End Date Taking? Authorizing Provider  aspirin EC 325 MG EC tablet Take 1 tablet (325 mg total) by mouth daily. 12/18/19  Yes Sheikh, Omair Latif, DO  carvedilol (COREG) 12.5 MG tablet Take 2 tablets (25 mg total) by mouth 2 (two) times daily with a meal. 01/12/20  Yes [name], Jolene T, NP  cloNIDine (CATAPRES) 0.2 MG tablet Take 0.2 mg by mouth 2 (two) times daily.   Yes [provider]  donepezil (ARICEPT) 5 MG tablet Take 1 tablet (5 mg total) by mouth at bedtime. 01/01/20  Yes [name], Jolene T, NP  furosemide (LASIX) 20 MG tablet Take 1 tablet (20 mg total) by mouth daily. 12/17/19  Yes Sheikh, Omair Latif, DO  gabapentin (NEURONTIN) 300 MG capsule Take 600 mg by mouth at bedtime.   Yes [provider]  insulin glargine (LANTUS) 100 UNIT/ML injection Inject 0.5 mLs (50 Units total) into the skin daily. 12/18/19  Yes Sheikh, Omair Latif, DO  meloxicam (MOBIC) 7.5 MG tablet Take 7.5 mg by mouth daily.   Yes [provider]  metFORMIN (GLUCOPHAGE) 1000 MG tablet Take 1,000 mg by mouth 2 (two) times daily with a meal.   Yes [provider]  olmesartan (BENICAR) 40 MG tablet Take 1 tablet (40 mg total) by mouth daily. 01/12/20  Yes [name], Jolene T, NP  primidone (MYSOLINE) 50 MG tablet Take 1  tablet (50 mg total) by mouth 2 (two) times daily. 11/28/19  Yes [name], Jolene T, NP  rosuvastatin (CRESTOR) 40 MG tablet Take 1 tablet (40 mg total) by mouth at bedtime. Patient taking differently: Take 40 mg by mouth at bedtime. 20 mg once a day 09/07/17  Yes Crissman, Jeannette How, MD  acyclovir (ZOVIRAX) 400 MG tablet Take 400 mg by mouth 2 (two) times daily.    [provider]  azelastine (ASTELIN) 0.1 % nasal spray Place 2 sprays into both nostrils 2 (two) times daily.  11/22/19   [provider]  clobetasol cream (TEMOVATE) 3.08 % Apply 1 application topically 2 (two) times daily. Apply to bilateral lower legs. 05/23/19   [name], Henrine Screws T, NP  ipratropium-albuterol (DUONEB) 0.5-2.5 (3) MG/3ML SOLN Take 3 mLs by nebulization every 6 (six) hours.    [provider]  mupirocin ointment (BACTROBAN) 2 % Apply 1 application topically 2 (two) times daily. To open blisters bilateral legs. 07/29/19   [name], Henrine Screws T, NP  naproxen (NAPROSYN) 500 MG tablet Take 500 mg by mouth 2 (two) times daily with a meal. Arthritis  [provider]  QVAR REDIHALER 40 MCG/ACT inhaler 1 puff 2 (two) times daily.  02/22/19   [provider]  tamsulosin (FLOMAX) 0.4 MG CAPS capsule Take 0.4 mg by mouth daily. Patient restarted on his own due to trouble urinating    [provider]  vitamin B-12 (CYANOCOBALAMIN) 500 MCG tablet Take 1,000 mcg by mouth daily. Two at night    [provider]  Vitamin D, Cholecalciferol, 25 MCG (1000 UT) CAPS Take 50 mcg by mouth daily. 2 a night    [provider]    Inpatient Medications:  . aspirin  324 mg Oral Daily  . budesonide (PULMICORT) nebulizer solution  0.25 mg Nebulization BID  . carvedilol  25 mg Oral BID WC  . Chlorhexidine Gluconate Cloth  6 each Topical Daily  . insulin aspart  0-9 Units Subcutaneous TID WC  . insulin glargine  25 Units Subcutaneous QHS  . irbesartan  300 mg Oral Daily  . mouth rinse  15 mL  Mouth Rinse BID  . rosuvastatin  40 mg Oral QHS  . sodium chloride flush  3 mL Intravenous Q12H  . tamsulosin  0.4 mg Oral Daily   . ceFEPime (MAXIPIME) IV 2 g (01/25/20 0534)  . metronidazole 500 mg (01/25/20 0422)    Allergies:  Allergies  Allergen Reactions  . Farxiga [Dapagliflozin] Rash  . Jardiance [Empagliflozin] Rash    Social History   Socioeconomic History  . Marital status: Married    Spouse name: Not on file  . Number of children: Not on file  . Years of education: Not on file  . Highest education level: GED or equivalent  Occupational History  . Occupation: retired    Comment: [employer]  Tobacco Use  . Smoking status: Former Smoker    Packs/day: 1.00    Years: 30.00    Pack years: 30.00    Types: Cigarettes    Quit date: 03/23/1989    Years since quitting: 30.8  . Smokeless tobacco: Never Used  Vaping Use  . Vaping Use: Never used  Substance and Sexual Activity  . Alcohol use: No    Alcohol/week: 0.0 standard drinks  . Drug use: No  . Sexual activity: Not on file  Other Topics Concern  . 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 Difficulty of Paying Living Expenses: Not very hard  Food Insecurity: No Food Insecurity  . Worried About Charity fundraiser in the Last Year: Never true  . Ran Out of Food in the Last Year: Never true  Transportation Needs: No Transportation Needs  . Lack of Transportation (Medical): No  . Lack of Transportation (Non-Medical): No  Physical Activity: Inactive  . Days of Exercise per Week: 0 days  . Minutes of Exercise per Session: 0 min  Stress: No Stress Concern Present  . Feeling of Stress : Only a little  Social Connections: Socially Integrated  . Frequency of Communication with Friends and Family: More than three times a week  . Frequency of Social Gatherings with Friends and Family: More than three times a  week  . Attends Religious Services: More than 4 times per year  . Active Member of Clubs or Organizations: Yes  . Attends Archivist Meetings: More than 4 times per year  . Marital Status: Married  Human resources officer Violence:  Unknown  . Fear of Current or Ex-Partner: No  . Emotionally Abused: Not on file  . Physically Abused: No  . Sexually Abused: No     Family History  Problem Relation Age of Onset  . Diabetes Father   . Esophageal cancer Mother   . Alzheimer's disease Paternal Uncle      Review of Systems Positive for shortness of breath leg weakness Negative for: General:  chills, fever, night sweats or weight changes.  Cardiovascular: PND orthopnea syncope dizziness  Dermatological skin lesions rashes Respiratory: Cough congestion Urologic: Frequent urination urination at night and hematuria Abdominal: negative for nausea, vomiting, diarrhea, bright red blood per rectum, melena, or hematemesis Neurologic: negative for visual changes, and/or hearing changes  All other systems reviewed and are otherwise negative except as noted above.  Labs: No results for input(s): CKTOTAL, CKMB, TROPONINI in the last 72 hours. Lab Results  Component Value Date   WBC 8.5 01/25/2020   HGB 13.0 01/25/2020   HCT 37.4 (L) 01/25/2020   MCV 90.3 01/25/2020   PLT 169 01/25/2020    Recent Labs  Lab 01/25/20 0435  NA 132*  K 3.7  CL 98  CO2 27  BUN 24*  CREATININE 1.17  CALCIUM 9.0  PROT 6.0*  BILITOT 1.1  ALKPHOS 52  ALT 15  AST 21  GLUCOSE 146*   Lab Results  Component Value Date   CHOL 234 (H) 01/01/2020   HDL 32 (L) 01/01/2020   LDLCALC 139 (H) 01/01/2020   TRIG 346 (H) 01/01/2020   No results found for: DDIMER  Radiology/Studies:  CT Head Wo Contrast  Result Date: 01/23/2020 CLINICAL DATA:  Altered mental status EXAM: CT HEAD WITHOUT CONTRAST TECHNIQUE: Contiguous axial images were obtained from the base of the skull through the vertex without intravenous  contrast. COMPARISON:  12/16/2019 FINDINGS: Brain: No evidence of acute infarction, hemorrhage, hydrocephalus, extra-axial collection or mass lesion/mass effect. Mild chronic white matter ischemic changes noted. Vascular: No hyperdense vessel or unexpected calcification. Skull: Normal. Negative for fracture or focal lesion. Sinuses/Orbits: No acute finding. Other: None. IMPRESSION: Mild chronic white matter ischemic changes stable from the prior exam. No acute abnormality noted. Electronically Signed   By: Inez Catalina M.D.   On: 01/23/2020 17:55   CT CHEST ABDOMEN PELVIS W CONTRAST  Result Date: 01/23/2020 CLINICAL DATA:  Shortness of breath and fever EXAM: CT CHEST, ABDOMEN, AND PELVIS WITH CONTRAST TECHNIQUE: Multidetector CT imaging of the chest, abdomen and pelvis was performed following the standard protocol during bolus administration of intravenous contrast. CONTRAST:  171mL OMNIPAQUE IOHEXOL 300 MG/ML  SOLN COMPARISON:  Chest x-ray from earlier in the same day, CT from 08/05/2011. FINDINGS: CT CHEST FINDINGS Cardiovascular: Thoracic aorta and its branches demonstrate atherosclerotic calcification. No aneurysmal dilatation or dissection is noted. No large central pulmonary embolus is noted. Cardiac shadow is within normal limits. Coronary calcifications are seen. Mediastinum/Nodes: Thoracic inlet is within normal limits. No sizable hilar or mediastinal adenopathy is noted. The esophagus as visualized is within normal limits. Lungs/Pleura: Mild emphysematous changes are seen. No focal confluent infiltrate is noted. Very mild basilar scarring is seen. 5 mm nodule is noted in the right lower lobe just above the hemidiaphragm stable in appearance from the prior exam. Small right middle lobe nodule is noted along the pleural margin anterior laterally also stable from the prior exam. No new parenchymal nodules are seen. Musculoskeletal: Degenerative changes of the thoracic spine are noted. No acute bony  abnormality is seen.  CT ABDOMEN PELVIS FINDINGS Hepatobiliary: Mild fatty infiltration of the liver is noted. An area of focal fatty sparing is noted along the falciform ligament. Gallbladder has been surgically. Pancreas: Pancreas is within normal limits. Spleen: Normal in size without focal abnormality. Adrenals/Urinary Tract: Adrenal glands are within normal limits bilaterally. Kidneys demonstrate a normal enhancement pattern bilaterally. Mild perinephric stranding is noted similar to that seen on prior exam. Delayed images demonstrate normal excretion of contrast material. The bladder is well distended. Stomach/Bowel: Postsurgical changes are noted in the colon. No obstructive or inflammatory changes are noted. The appendix is not well visualized although no inflammatory changes to suggest appendicitis are noted. The small bowel and stomach are within normal limits. Vascular/Lymphatic: Aortic atherosclerosis. No enlarged abdominal or pelvic lymph nodes. Reproductive: Prostate is prominent measuring 7.6 cm in transverse dimension. Other: No abdominal wall hernia or abnormality. No abdominopelvic ascites. Musculoskeletal: Mild degenerative changes of the lumbar spine are noted.  DG Chest Port 1 View  Result Date: 01/23/2020 CLINICAL DATA:  Worsening shortness of breath and fever, fell yesterday, sepsis, lethargy, history asthma, COPD, diabetes mellitus, hypertension EXAM: PORTABLE CHEST 1 VIEW COMPARISON:  Portable exam 1237 hours compared to 12/14/2019 FINDINGS: Normal heart size, mediastinal contours, and pulmonary vascularity. Minimal RIGHT basilar atelectasis and LEFT basilar scarring unchanged. Lungs otherwise clear. No acute infiltrate, pleural effusion or pneumothorax. Scattered endplate spur formation thoracic spine. IMPRESSION: LEFT basilar scarring and minimal RIGHT basilar atelectasis, unchanged. No acute abnormalities. Aortic Atherosclerosis (ICD10-I70.0). Electronically Signed   By: Lavonia Dana  M.D.   On: 01/23/2020 12:46   ECHOCARDIOGRAM LIMITED  Result Date: 01/24/2020    ECHOCARDIOGRAM LIMITED REPORT   Patient Name:   ROCCO KERKHOFF Date of Exam: 01/24/2020 Medical Rec #:  [id number]      Height:       71.0 in Accession #:    [id number]     Weight:       290.8 lb Date of Birth:  1941-01-17      BSA:          2.472 m Patient Age:    60 years       BP:           not listed in chart/not listed in                                              chart mmHg Patient Gender: M              HR:           Not listed in chart bpm. Exam Location:  [facility] Procedure: Limited Echo, Color Doppler and Cardiac Doppler Indications:     Elevated troponin  History:         Patient has prior history of Echocardiogram examinations, most                  recent 12/16/2019. COPD; Risk Factors:Sleep Apnea, Hypertension                  and Diabetes. History of tobacco use.  Sonographer:     Sherrie Sport RDCS (AE) Referring Phys:  [phone] A CALDWELL POWELL JR Diagnosing Phys: Kate Sable MD  Sonographer Comments: Technically difficult study due to poor echo windows, suboptimal parasternal window and no apical window. The best view was subcostal view. IMPRESSIONS  1.  Left ventricular ejection fraction, by estimation, is 60 to 65%. The left ventricle has normal function. The left ventricle has no regional wall motion abnormalities. Left ventricular diastolic function could not be evaluated.  2. Right ventricular systolic function is normal. The right ventricular size is normal.  3. The mitral valve is normal in structure. No evidence of mitral valve regurgitation. FINDINGS  Left Ventricle: Left ventricular ejection fraction, by estimation, is 60 to 65%. The left ventricle has normal function. The left ventricle has no regional wall motion abnormalities. The left ventricular internal cavity size was normal in size. Left ventricular diastolic function could not be evaluated. Right Ventricle: The right ventricular size is normal. Right  ventricular systolic function is normal. Pericardium: There is no evidence of pericardial effusion. Mitral Valve: The mitral valve is normal in structure. Tricuspid Valve: The tricuspid valve is normal in structure. Tricuspid valve regurgitation is not demonstrated. Pulmonic Valve: The pulmonic valve was not assessed. Aorta: The aortic root is normal in size and structure. Venous: The inferior vena cava was not well visualized. LEFT VENTRICLE PLAX 2D LVIDd:         4.25 cm LVIDs:         2.89 cm LV PW:         1.46 cm LV IVS:        1.44 cm LVOT diam:     2.00 cm LVOT Area:     3.14 cm  LEFT ATRIUM         Index LA diam:    3.00 cm 1.21 cm/m   AORTA Ao Root diam: 3.10 cm  SHUNTS Systemic Diam: 2.00 cm Kate Sable MD Electronically signed by Kate Sable MD Signature Date/Time: 01/24/2020/12:56:21 PM    Final     EKG: Sinus tachycardia with poor R wave progression  Weights: Filed Weights   01/24/20 0052 01/24/20 0415 01/25/20 0157  Weight: 133.4 kg 131.9 kg (!) 137.1 kg     Physical Exam: Blood pressure (!) 153/77, pulse 83, temperature 98.8 F (37.1 C), temperature source Oral, resp. rate 16, height 5\' 11"  (1.803 m), weight (!) 137.1 kg, SpO2 95 %. Body mass index is 42.16 kg/m. General: Well developed, well nourished, in no acute distress. Head eyes ears nose throat: Normocephalic, atraumatic, sclera non-icteric, no xanthomas, nares are without discharge. No apparent thyromegaly and/or mass  Lungs: Normal respiratory effort.  Diffuse wheezes, no rales, some rhonchi.  Heart: RRR with normal S1 S2. no murmur gallop, no rub, PMI is normal size and placement, carotid upstroke normal without bruit, jugular venous pressure is normal Abdomen: Soft, non-tender,  distended with normoactive bowel sounds. No hepatomegaly. No rebound/guarding. No obvious abdominal masses. Abdominal aorta is normal size without bruit Extremities: Trace to 1+ edema. no cyanosis, no clubbing, some abrasions and  ulcers  Peripheral : 2+ bilateral upper extremity pulses, 0+ bilateral femoral pulses, 0+ bilateral dorsal pedal pulse Neuro: Alert and oriented. No facial asymmetry. No focal deficit. Moves all extremities spontaneously. Musculoskeletal: Normal muscle tone without kyphosis Psych:  Responds to questions appropriately with a normal affect.    Assessment: 79 year old male with known severe COPD sleep apnea obesity lower extremity edema with venous stasis coronary artery atherosclerosis hypertension hyperlipidemia diabetes with admission to the hospital with altered mental status hypoxia and possible sepsis with an elevated troponin consistent with demand ischemia at this time but no evidence of acute coronary syndrome or congestive heart failure  Plan: 1.  Continue supportive care for altered mental status and  possible sepsis and primary treatment of that sepsis 2.  Continue aggressive medical management for risk factor modification cardiovascular disease and demand ischemia non-ST elevation myocardial infarction.  This includes continuation of his current dose of carvedilol at 25 mg twice per day, angiotensin receptor blocker including irbesartan at current dose of 300 mg/day, and rosuvastatin at maximum dose of 40 mg 3.  No further cardiac diagnostics necessary at this time due to no apparent evidence of acute coronary syndrome although we will continue to follow for any new issues or concerns as the patient recovers from above 4.  Patient may benefit from addition of oral Lasix for lower extremity edema 5.  Begin ambulation and rehabilitation with treatment of COPD and follow for symptoms from the cardiovascular standpoint  Signed, Corey Skains M.D. [facility] 01/25/2020, 8:47 AM

## 2020-01-25 NOTE — Progress Notes (Signed)
Mobility Specialist - Progress Note   01/25/20 1245  Mobility  Activity Ambulated in room  Level of Assistance Minimal assist, patient does 75% or more  Assistive Device Front wheel walker  Distance Ambulated (ft) 80 ft  Mobility Response Tolerated well  Mobility performed by Mobility specialist  $Mobility charge 1 Mobility    Pre-mobility: 60 HR, 96% SpO2 Post-mobility: 77 HR, 95% SpO2   Pt was sleeping in bed upon arrival utilizing 1L [place] O2 with spouse present. Pt awakens to light touch and voice. Pt agreed to session. Pt struggled to stay awake at beginning of session, but was more alert upon sitting up in bed. Pt got to EOB with SBA where he stood to RW with minA. Pt ambulated 27' in room with no LOB noted. CGA was used for safety. Noticed heavy breathing, however pt continuously denied SOB. O2 > mid 90s throughout session. VC and minA needed for safe turning d/t cord management. Overall, pt tolerated session well. Pt was left in bed with all needs in reach and alarm set.    Kathee Delton Mobility Specialist 01/25/20, 12:55 PM

## 2020-01-25 NOTE — Progress Notes (Signed)
OT Cancellation Note  Patient Details Name: Justin Griffith MRN: [id number] DOB: [birth year]   Cancelled Treatment:    Reason Eval/Treat Not Completed: Other (comment). Consult received, chart reviewed. Upon attempt, respiratory therapist initiating breathing treatment. Will re-attempt at later date/time as pt is available and medically appropriate.   Jeni Salles, MPH, MS, OTR/L ascom [phone] 01/25/20, 2:48 PM

## 2020-01-25 NOTE — Progress Notes (Signed)
PHARMACIST - PHYSICIAN COMMUNICATION  DR:  TRH  CONCERNING: IV to Oral Route Change Policy  RECOMMENDATION: This patient is receiving metronidazole by the intravenous route.  Based on criteria approved by the Pharmacy and Therapeutics Committee, the intravenous medication(s) is/are being converted to the equivalent oral dose form(s).  IV metronidazole is on backorder,  Changed to PO starting 11/5 am as tolerating other PO medications  DESCRIPTION: These criteria include:  The patient is eating (either orally or via tube) and/or has been taking other orally administered medications for a least 24 hours  The patient has no evidence of active gastrointestinal bleeding or impaired GI absorption (gastrectomy, short bowel, patient on TNA or NPO).  If you have questions about this conversion, please contact the Pharmacy Department  []   [phone] )  [name] [x]   [phone] )  [facility] []   [phone] )  [facility] []   [phone] )  [facility] []   [phone] )  [place], PharmD, BCPS.   Work Cell: [phone] 01/25/2020 3:05 PM

## 2020-01-26 ENCOUNTER — Inpatient Hospital Stay: Payer: Medicare Other

## 2020-01-26 DIAGNOSIS — A419 Sepsis, unspecified organism: Secondary | ICD-10-CM | POA: Diagnosis not present

## 2020-01-26 LAB — HEPATIC FUNCTION PANEL
ALT: 15 U/L (ref 0–44)
AST: 21 U/L (ref 15–41)
Albumin: 2.4 g/dL — ABNORMAL LOW (ref 3.5–5.0)
Alkaline Phosphatase: 53 U/L (ref 38–126)
Bilirubin, Direct: 0.1 mg/dL (ref 0.0–0.2)
Indirect Bilirubin: 0.5 mg/dL (ref 0.3–0.9)
Total Bilirubin: 0.6 mg/dL (ref 0.3–1.2)
Total Protein: 5.8 g/dL — ABNORMAL LOW (ref 6.5–8.1)

## 2020-01-26 LAB — BASIC METABOLIC PANEL
Anion gap: 9 (ref 5–15)
BUN: 30 mg/dL — ABNORMAL HIGH (ref 8–23)
CO2: 25 mmol/L (ref 22–32)
Calcium: 9.4 mg/dL (ref 8.9–10.3)
Chloride: 95 mmol/L — ABNORMAL LOW (ref 98–111)
Creatinine, Ser: 1.28 mg/dL — ABNORMAL HIGH (ref 0.61–1.24)
GFR, Estimated: 57 mL/min — ABNORMAL LOW (ref >60)
Glucose, Bld: 142 mg/dL — ABNORMAL HIGH (ref 70–99)
Potassium: 4 mmol/L (ref 3.5–5.1)
Sodium: 129 mmol/L — ABNORMAL LOW (ref 135–145)

## 2020-01-26 LAB — CSF CELL COUNT WITH DIFFERENTIAL
Eosinophils, CSF: 0 %
Lymphs, CSF: 27 %
Monocyte-Macrophage-Spinal Fluid: 73 %
RBC Count, CSF: 26 /mm3 — ABNORMAL HIGH (ref 0–3)
Segmented Neutrophils-CSF: 0 %
Tube #: 1
WBC, CSF: 3 /mm3 (ref 0–5)

## 2020-01-26 LAB — CBC
HCT: 37.5 % — ABNORMAL LOW (ref 39.0–52.0)
Hemoglobin: 12.6 g/dL — ABNORMAL LOW (ref 13.0–17.0)
MCH: 30.7 pg (ref 26.0–34.0)
MCHC: 33.6 g/dL (ref 30.0–36.0)
MCV: 91.5 fL (ref 80.0–100.0)
Platelets: 161 10*3/uL (ref 150–400)
RBC: 4.1 MIL/uL — ABNORMAL LOW (ref 4.22–5.81)
RDW: 12.9 % (ref 11.5–15.5)
WBC: 6.2 10*3/uL (ref 4.0–10.5)
nRBC: 0 % (ref 0.0–0.2)

## 2020-01-26 LAB — GLUCOSE, CAPILLARY
Glucose-Capillary: 126 mg/dL — ABNORMAL HIGH (ref 70–99)
Glucose-Capillary: 198 mg/dL — ABNORMAL HIGH (ref 70–99)
Glucose-Capillary: 204 mg/dL — ABNORMAL HIGH (ref 70–99)
Glucose-Capillary: 176 mg/dL — ABNORMAL HIGH (ref 70–99)

## 2020-01-26 LAB — PROTEIN, CSF: Total  Protein, CSF: 67 mg/dL — ABNORMAL HIGH (ref 15–45)

## 2020-01-26 LAB — PROTIME-INR
INR: 1.2 (ref 0.8–1.2)
Prothrombin Time: 14.3 seconds (ref 11.4–15.2)

## 2020-01-26 LAB — MAGNESIUM: Magnesium: 2.1 mg/dL (ref 1.7–2.4)

## 2020-01-26 LAB — GLUCOSE, CSF: Glucose, CSF: 88 mg/dL — ABNORMAL HIGH (ref 40–70)

## 2020-01-26 NOTE — Progress Notes (Signed)
PT Cancellation Note  Patient Details Name: Justin Griffith MRN: [id number] DOB: 05-02-1940   Cancelled Treatment:     Pt post op lumbar puncture >4 hours ago & RN cleared pt for participation in tx. Pt received in bathroom, requesting time to finish toileting. Will f/u with pt as able.  Lavone Nian, PT, DPT 01/26/20, 3:46 PM    Waunita Schooner 01/26/2020, 3:45 PM

## 2020-01-26 NOTE — Progress Notes (Addendum)
PROGRESS NOTE    Justin Griffith  LOV:[id number] DOB: July 03, 1940 DOA: 01/23/2020 PCP: Patient, No Pcp Per   Chief Complaint  Patient presents with  . Shortness of Breath    Brief Narrative: Justin Griffith Batool Majid 79 y.o.malewith medical history significant forCOPD, chronic respiratory failure with hypoxia on 3 L of home O2 via [place], CAD, PVD with chronic venous stasisdermatitis, history of CVA, T2DM, HTN, HLD, BPH, essential tremor, and OSA on CPAP whois admitted with sepsis without Ardell Aaronson clear source.  Assessment & Plan:   Principal Problem:   Sepsis ([place]) Active Problems:   Hypertension associated with diabetes ([place])   Type 2 diabetes mellitus with proteinuria (HCC)   Hyperlipidemia associated with type 2 diabetes mellitus (HCC)   Sleep apnea   BPH (benign prostatic hyperplasia)   COPD (chronic obstructive pulmonary disease) (HCC)   Elevated troponin   Coronary artery disease   History of CVA (cerebrovascular accident)   Gross hematuria  SIRS with unspecified source: Patient presenting on arrival with fever, tachycardia, tachypnea, and leukocytosis.  No obvious infectious source found at this time. CT chest/abdomen/pelvis without acute abnormalities - mild perinephric stranding (see report). Urinalysis with rare bacteria microscopy. SARS-CoV-2 and influenza Cade Olberding/B PCR's are negative.  - continue ceftriaxone, will d/c flagyl  --MRSA screen neg, vanc d/c'ed --procal 0.87 on 11/3, trend -- LP with 3 WBC's, RBC 26, glucose 88, total protein 67 - not c/w bacterial meningitis, elevated protein could be related to diabetes -> follow HSV pcr, though low suspicion, will not start antiviral at this time, especially with his clinical improvement -- follow CSF cx -- urine cx NG, blood cx NGTD  Somnolence, possible acute encephalopathy --Pt could barely stay awake.  Not on sedating medications.  VBG without hypercarbia.  Ammonia 19.  Follow B12 (wnl), TSH (wnl).  -- improved today, unclear  cause at this point, he notes some chronic sleepiness - continue cpap nightly   elevated troponin 2/2 demand ischemia ACS ruled out Hx of CAD High-sensitivity troponin initially elevated 387 with downward trend on repeat 355. Patient does have history of CAD. He denies any recent chest pain.Suspect demand ischemia in setting of possible sepsis. - echo without RWMA, EF 60-65% PLAN: --cardiology consult - no recommendation for additional cardiac diagnostics - continue coreg, irbesartan, and crestor, continue aspirin  BPH with gross hematuria: --urology consulted.  Hematuria suspected 2/2 foley trauma --foley d/c'ed PLAN: --cont flomax --Monitor urine output -- follow with urology outpatient  Falls at home/generalized weakness: Reports multiple falls at home with no significant injury. -Request PT/OT eval -Continue fall precautions  COPD chronic respiratory failure with hypoxia on home 3L  Stable without acute exacerbation. --cont Pulmicort neb and DuoNeb --cont home suppl O2  Hypomagnesemia: --Monitor and replete PRN  History of CVA: Punctate acute 3 mm acute ischemic nonhemorrhagic cortical infarct involving the right occipital lobe noted on last admission with MRI brain 12/14/2019. -Continue aspirin and statin  Type 2 diabetes: A1c 7.5 on 12/15/2019. --cont Lantus 25u nightly --SSI  Hypertension: BP improved -Continue Coreg 25 mg twice daily -Continue olmesartan 40 mg daily (switchedto irbesartan per pharmacy formulary while in hospital) -Holding home Lasix for now -DC losartan from home meds -Hold clonidine, not clear if he has been taking this regularly  Hyperlipidemia: Continue rosuvastatin.  OSA: Continue CPAP nightly.   DVT prophylaxis: SCD Code Status:full  Family Communication: none at bedside -- 11/5 daughter/wife Disposition:   Status is: Inpatient  Remains inpatient appropriate because:Inpatient level of care  appropriate due to  severity of illness   Dispo: The patient is from: Home              Anticipated d/c is to: pending              Anticipated d/c date is: > 3 days              Patient currently is not medically stable to d/c.   Consultants:   Cardiology  IR  Procedures:  Echo IMPRESSIONS    1. Left ventricular ejection fraction, by estimation, is 60 to 65%. The  left ventricle has normal function. The left ventricle has no regional  wall motion abnormalities. Left ventricular diastolic function could not  be evaluated.  2. Right ventricular systolic function is normal. The right ventricular  size is normal.  3. The mitral valve is normal in structure. No evidence of mitral valve  regurgitation.   11/5 IMPRESSION: Successful fluoroscopically directed lumbar puncture.  Antimicrobials:  Anti-infectives (From admission, onward)   Start     Dose/Rate Route Frequency Ordered Stop   01/26/20 0800  metroNIDAZOLE (FLAGYL) tablet 500 mg  Status:  Discontinued        500 mg Oral Every 8 hours 01/25/20 1506 01/25/20 1647   01/25/20 2200  metroNIDAZOLE (FLAGYL) tablet 500 mg  Status:  Discontinued        500 mg Oral Every 8 hours 01/25/20 1647 01/26/20 1826   01/25/20 2200  cefTRIAXone (ROCEPHIN) 2 g in sodium chloride 0.9 % 100 mL IVPB        2 g 200 mL/hr over 30 Minutes Intravenous Every 24 hours 01/25/20 1647     01/24/20 0600  vancomycin (VANCOCIN) IVPB 1000 mg/200 mL premix  Status:  Discontinued        1,000 mg 200 mL/hr over 60 Minutes Intravenous Every 12 hours 01/23/20 2010 01/24/20 1917   01/23/20 2200  ceFEPIme (MAXIPIME) 2 g in sodium chloride 0.9 % 100 mL IVPB  Status:  Discontinued        2 g 200 mL/hr over 30 Minutes Intravenous Every 8 hours 01/23/20 2010 01/25/20 1647   01/23/20 2000  metroNIDAZOLE (FLAGYL) IVPB 500 mg  Status:  Discontinued        500 mg 100 mL/hr over 60 Minutes Intravenous Every 8 hours 01/23/20 1941 01/25/20 1647   01/23/20 1300  vancomycin (VANCOCIN)  IVPB 1000 mg/200 mL premix       "Followed by" Linked Group Details   1,000 mg 200 mL/hr over 60 Minutes Intravenous  Once 01/23/20 1250 01/23/20 1459   01/23/20 1300  vancomycin (VANCOREADY) IVPB 1500 mg/300 mL       "Followed by" Linked Group Details   1,500 mg 150 mL/hr over 120 Minutes Intravenous  Once 01/23/20 1250 01/23/20 1804   01/23/20 1230  vancomycin (VANCOCIN) IVPB 1000 mg/200 mL premix  Status:  Discontinued        1,000 mg 200 mL/hr over 60 Minutes Intravenous  Once 01/23/20 1227 01/23/20 1248   01/23/20 1230  ceFEPIme (MAXIPIME) 2 g in sodium chloride 0.9 % 100 mL IVPB        2 g 200 mL/hr over 30 Minutes Intravenous  Once 01/23/20 1227 01/23/20 1355      Subjective: No new complaints Lavert Matousek&O  Objective: Vitals:   01/26/20 1336 01/26/20 1410 01/26/20 1740 01/26/20 1753  BP: (!) 146/80  (!) 188/98 (!) 169/89  Pulse: 61  84   Resp: 18  18  Temp: 98.1 F (36.7 C)  (!) 97.4 F (36.3 C)   TempSrc: Oral  Oral   SpO2: 100% 98% 100%   Weight:      Height:        Intake/Output Summary (Last 24 hours) at 01/26/2020 1825 Last data filed at 01/26/2020 1033 Gross per 24 hour  Intake 360 ml  Output 850 ml  Net -490 ml   Filed Weights   01/24/20 0415 01/25/20 0157 01/26/20 0406  Weight: 131.9 kg (!) 137.1 kg (!) 136.7 kg    Examination:  General: No acute distress. Cardiovascular: Heart sounds show Mayrene Bastarache regular rate, and rhythm Lungs: Clear to auscultation bilaterally  Abdomen: Soft, nontender, nondistended  Neurological: Alert and oriented 3, lethargy resolved. Moves all extremities 4. Cranial nerves II through XII grossly intact. Skin: Warm and dry. No rashes or lesions. Extremities: No clubbing or cyanosis. No edema.   Data Reviewed: I have personally reviewed following labs and imaging studies  CBC: Recent Labs  Lab 01/23/20 1617 01/24/20 0431 01/25/20 0435 01/26/20 0409  WBC 13.7* 15.3* 8.5 6.2  NEUTROABS 11.7*  --  6.8  --   HGB 15.4 13.7 13.0  12.6*  HCT 45.3 39.8 37.4* 37.5*  MCV 91.0 90.9 90.3 91.5  PLT 219 190 169 423    Basic Metabolic Panel: Recent Labs  Lab 01/23/20 1617 01/24/20 0431 01/25/20 0435 01/26/20 0409  NA 137 133* 132* 129*  K 3.9 3.7 3.7 4.0  CL 98 98 98 95*  CO2 25 28 27 25   GLUCOSE 122* 180* 146* 142*  BUN 20 18 24* 30*  CREATININE 1.06 1.12 1.17 1.28*  CALCIUM 9.8 9.4 9.0 9.4  MG 1.6* 1.9 1.9 2.1  PHOS  --   --  2.2*  --     GFR: Estimated Creatinine Clearance: 65 mL/min (Alanie Syler) (by C-G formula based on SCr of 1.28 mg/dL (H)).  Liver Function Tests: Recent Labs  Lab 01/23/20 1617 01/25/20 0435 01/26/20 0409  AST 31 21 21   ALT 17 15 15   ALKPHOS 52 52 53  BILITOT 1.7* 1.1 0.6  PROT 7.1 6.0* 5.8*  ALBUMIN 3.4* 2.6* 2.4*    CBG: Recent Labs  Lab 01/25/20 1619 01/25/20 2104 01/26/20 0757 01/26/20 1144 01/26/20 1535  GLUCAP 172* 164* 126* 198* 204*     Recent Results (from the past 240 hour(s))  Urine culture     Status: None   Collection Time: 01/23/20 12:26 PM   Specimen: Urine, Random  Result Value Ref Range Status   Specimen Description   Final    URINE, RANDOM Performed at [facility], [address] [place], [place] [address]    Special Requests   Final    NONE Performed at [facility], [address], [place], [place] [address]    Culture   Final    NO GROWTH Performed at [facility], [place] [address], [place], [place] [address]    Report Status 01/24/2020 FINAL  Final  Respiratory Panel by RT PCR (Flu Junnie Loschiavo&B, Covid) - Nasopharyngeal Swab     Status: None   Collection Time: 01/23/20  1:02 PM   Specimen: Nasopharyngeal Swab  Result Value Ref Range Status   SARS Coronavirus 2 by RT PCR NEGATIVE NEGATIVE Final    Comment: (NOTE) SARS-CoV-2 target nucleic acids are NOT DETECTED.  The SARS-CoV-2 RNA is generally detectable in upper respiratoy specimens during the acute phase of infection. The lowest concentration of SARS-CoV-2 viral  copies this  assay can detect is 131 copies/mL. Byanka Landrus negative result does not preclude SARS-Cov-2 infection and should not be used as the sole basis for treatment or other patient management decisions. Akram Kissick negative result may occur with  improper specimen collection/handling, submission of specimen other than nasopharyngeal swab, presence of viral mutation(s) within the areas targeted by this assay, and inadequate number of viral copies (<131 copies/mL). Brittish Bolinger negative result must be combined with clinical observations, patient history, and epidemiological information. The expected result is Negative.  Fact Sheet for Patients:  PinkCheek.be  Fact Sheet for Healthcare Providers:  GravelBags.it  This test is no t yet approved or cleared by the [place] FDA and  has been authorized for detection and/or diagnosis of SARS-CoV-2 by FDA under an Emergency Use Authorization (EUA). This EUA will remain  in effect (meaning this test can be used) for the duration of the COVID-19 declaration under Section 564(b)(1) of the Act, 21 U.S.C. section 360bbb-3(b)(1), unless the authorization is terminated or revoked sooner.     Influenza Ebunoluwa Gernert by PCR NEGATIVE NEGATIVE Final   Influenza B by PCR NEGATIVE NEGATIVE Final    Comment: (NOTE) The Xpert Xpress SARS-CoV-2/FLU/RSV assay is intended as an aid in  the diagnosis of influenza from Nasopharyngeal swab specimens and  should not be used as Autry Droege sole basis for treatment. Nasal washings and  aspirates are unacceptable for Xpert Xpress SARS-CoV-2/FLU/RSV  testing.  Fact Sheet for Patients: PinkCheek.be  Fact Sheet for Healthcare Providers: GravelBags.it  This test is not yet approved or cleared by the [place] FDA and  has been authorized for detection and/or diagnosis of SARS-CoV-2 by  FDA under an Emergency Use Authorization (EUA). This  EUA will remain  in effect (meaning this test can be used) for the duration of the  Covid-19 declaration under Section 564(b)(1) of the Act, 21  U.S.C. section 360bbb-3(b)(1), unless the authorization is  terminated or revoked. Performed at [facility], [address] [place], [place] [address]   Blood Culture (routine x 2)     Status: None (Preliminary result)   Collection Time: 01/23/20  4:16 PM   Specimen: BLOOD  Result Value Ref Range Status   Specimen Description BLOOD BLOOD LEFT HAND  Final   Special Requests   Final    BOTTLES DRAWN AEROBIC ONLY Blood Culture results may not be optimal due to an inadequate volume of blood received in culture bottles   Culture   Final    NO GROWTH 3 DAYS Performed at [facility], [address] [place], [place] [address]    Report Status PENDING  Incomplete  Blood Culture (routine x 2)     Status: None (Preliminary result)   Collection Time: 01/23/20  4:16 PM   Specimen: BLOOD  Result Value Ref Range Status   Specimen Description BLOOD BLOOD RIGHT HAND  Final   Special Requests   Final    BOTTLES DRAWN AEROBIC AND ANAEROBIC Blood Culture adequate volume   Culture   Final    NO GROWTH 3 DAYS Performed at [facility], [address], [place], [place] [address]    Report Status PENDING  Incomplete  MRSA PCR Screening     Status: None   Collection Time: 01/24/20  5:06 PM   Specimen: Nasal Mucosa; Nasopharyngeal  Result Value Ref Range Status   MRSA by PCR NEGATIVE NEGATIVE Final    Comment:        The GeneXpert MRSA Assay (FDA approved for  NASAL specimens only), is one component of Uchenna Rappaport comprehensive MRSA colonization surveillance program. It is not intended to diagnose MRSA infection nor to guide or monitor treatment for MRSA infections. Performed at [facility], [address] [place], [place] [address]   Respiratory Panel by PCR     Status: None   Collection Time: 01/25/20 12:10 PM    Specimen: Respiratory  Result Value Ref Range Status   Adenovirus NOT DETECTED NOT DETECTED Final   Coronavirus 229E NOT DETECTED NOT DETECTED Final    Comment: (NOTE) The Coronavirus on the Respiratory Panel, DOES NOT test for the novel  Coronavirus (2019 nCoV)    Coronavirus HKU1 NOT DETECTED NOT DETECTED Final   Coronavirus NL63 NOT DETECTED NOT DETECTED Final   Coronavirus OC43 NOT DETECTED NOT DETECTED Final   Metapneumovirus NOT DETECTED NOT DETECTED Final   Rhinovirus / Enterovirus NOT DETECTED NOT DETECTED Final   Influenza Hilery Wintle NOT DETECTED NOT DETECTED Final   Influenza B NOT DETECTED NOT DETECTED Final   Parainfluenza Virus 1 NOT DETECTED NOT DETECTED Final   Parainfluenza Virus 2 NOT DETECTED NOT DETECTED Final   Parainfluenza Virus 3 NOT DETECTED NOT DETECTED Final   Parainfluenza Virus 4 NOT DETECTED NOT DETECTED Final   Respiratory Syncytial Virus NOT DETECTED NOT DETECTED Final   Bordetella pertussis NOT DETECTED NOT DETECTED Final   Chlamydophila pneumoniae NOT DETECTED NOT DETECTED Final   Mycoplasma pneumoniae NOT DETECTED NOT DETECTED Final    Comment: Performed at [facility], [place]. [address] [place], [place] [address]  CSF culture     Status: None (Preliminary result)   Collection Time: 01/26/20  8:43 AM   Specimen: PATH Cytology CSF; Cerebrospinal Fluid  Result Value Ref Range Status   Specimen Description CSF  Final   Special Requests CSF  Final   Gram Stain   Final    NO ORGANISMS SEEN NO WBC SEEN RBC'S SEEN Performed at [facility], [address], [place], [place] [address]    Culture PENDING  Incomplete   Report Status PENDING  Incomplete         Radiology Studies: DG FLUORO GUIDED LOC OF NEEDLE/CATH TIP FOR SPINAL INJECT LT  Result Date: 01/26/2020 CLINICAL DATA:  Systemic inflammatory response syndrome. EXAM: DIAGNOSTIC LUMBAR PUNCTURE UNDER FLUOROSCOPIC GUIDANCE FLUOROSCOPY TIME:  Fluoroscopy Time:  0 minutes 24 seconds  Radiation Exposure Index (if provided by the fluoroscopic device): 21.0 mGy Number of Acquired Spot Images: 1. PROCEDURE: Informed consent was obtained from the patient prior to the procedure, including potential complications of headache, allergy, and pain. With the patient prone, the lower back was prepped with Betadine. 1% Lidocaine was used for local anesthesia. Lumbar puncture was performed at the L4-L5 level using Desean Heemstra 22 gauge needle with return of clear CSF with an opening pressure of 15 cm water. 10 ml of CSF were obtained for laboratory studies. The patient tolerated the procedure well and there were no apparent complications. IMPRESSION: Successful fluoroscopically directed lumbar puncture. Electronically Signed   By: Granjeno   On: 01/26/2020 09:02        Scheduled Meds: . aspirin  324 mg Oral Daily  . budesonide (PULMICORT) nebulizer solution  0.25 mg Nebulization BID  . carvedilol  25 mg Oral BID WC  . Chlorhexidine Gluconate Cloth  6 each Topical Daily  . insulin aspart  0-9 Units Subcutaneous TID WC  . insulin glargine  25 Units Subcutaneous QHS  . ipratropium-albuterol  3  mL Nebulization TID  . irbesartan  300 mg Oral Daily  . mouth rinse  15 mL Mouth Rinse BID  . metroNIDAZOLE  500 mg Oral Q8H  . rosuvastatin  40 mg Oral QHS  . sodium chloride flush  3 mL Intravenous Q12H  . tamsulosin  0.4 mg Oral Daily   Continuous Infusions: . cefTRIAXone (ROCEPHIN)  IV 2 g (01/25/20 2125)     LOS: 3 days    Time spent: over 30 min    Fayrene Helper, MD [facility]   To contact the attending provider between 7A-7P or the covering provider during after hours 7P-7A, please log into the web site [email or web address] and access using universal [place] password for that web site. If you do not have the password, please call the hospital operator.  01/26/2020, 6:25 PM

## 2020-01-26 NOTE — Progress Notes (Signed)
PT Cancellation Note  Patient Details Name: Justin Griffith MRN: [id number] DOB: 1941-01-05   Cancelled Treatment:     Chart reviewed & pt had lumbar puncture this AM, now on bed rest. Will f/u with pt as able & as pt is medically appropriate.   Lavone Nian, PT, DPT 01/26/20, 9:58 AM    Waunita Schooner 01/26/2020, 9:57 AM

## 2020-01-26 NOTE — Progress Notes (Signed)
OT Cancellation Note  Patient Details Name: Justin Griffith MRN: [id number] DOB: 1940-03-28   Cancelled Treatment:    Reason Eval/Treat Not Completed: Patient at procedure or test/ unavailable. Per RN, pt going off the floor for lumbar puncture. Will re-attempt OT evaluation later today as medically appropriate.   Jeni Salles, MPH, MS, OTR/L ascom [phone] 01/26/20, 8:08 AM

## 2020-01-26 NOTE — Care Management Important Message (Signed)
Important Message  Patient Details  Name: Justin Griffith MRN: [id number] Date of Birth: 06/22/1940   Medicare Important Message Given:  N/A - LOS <3 / Initial given by admissions     Juliann Pulse A Kerigan Narvaez 01/26/2020, 1:50 PM

## 2020-01-26 NOTE — Progress Notes (Signed)
Pt stable after l.p.Back stable.F/u with his M.D.[employer]

## 2020-01-26 NOTE — Progress Notes (Signed)
[facility]    SUBJECTIVE: Patient reports no chest pain or pressure. He reports chronic dyspnea but is not currently short of breath. He denies palpitations or heart racing. He reports chronic peripheral edema of the right leg but feels it is at his baseline.    Vitals:   01/25/20 2110 01/25/20 2113 01/26/20 0406 01/26/20 0740  BP:   (!) 154/82   Pulse:   73 74  Resp:  18 20 (!) 21  Temp:   98.4 F (36.9 C)   TempSrc:   Oral   SpO2: 97%  94% 98%  Weight:   (!) 136.7 kg   Height:         Intake/Output Summary (Last 24 hours) at 01/26/2020 0913 Last data filed at 01/26/2020 0300 Gross per 24 hour  Intake 960 ml  Output 2025 ml  Net -1065 ml      PHYSICAL EXAM  General: Well developed, well nourished, in no acute distress. Patient is sitting up in bed drinking water and watching TV. HEENT:  Normocephalic and atramatic Neck:  No JVD.  Lungs: Clear bilaterally to auscultation Heart: HRRR . Normal S1 and S2 without gallops or murmurs.  Abdomen: nondistended  Msk:  No obvious deformities. Extremities: No clubbing, cyanosis or edema.   Neuro: Alert and oriented X 3. Psych:  Good affect, responds appropriately   LABS: Basic Metabolic Panel: Recent Labs    01/25/20 0435 01/26/20 0409  NA 132* 129*  K 3.7 4.0  CL 98 95*  CO2 27 25  GLUCOSE 146* 142*  BUN 24* 30*  CREATININE 1.17 1.28*  CALCIUM 9.0 9.4  MG 1.9 2.1  PHOS 2.2*  --    Liver Function Tests: Recent Labs    01/25/20 0435 01/26/20 0409  AST 21 21  ALT 15 15  ALKPHOS 52 53  BILITOT 1.1 0.6  PROT 6.0* 5.8*  ALBUMIN 2.6* 2.4*   No results for input(s): LIPASE, AMYLASE in the last 72 hours. CBC: Recent Labs    01/23/20 1617 01/24/20 0431 01/25/20 0435 01/26/20 0409  WBC 13.7*   < > 8.5 6.2  NEUTROABS 11.7*  --  6.8  --   HGB 15.4   < > 13.0 12.6*  HCT 45.3   < > 37.4* 37.5*  MCV 91.0   < > 90.3 91.5  PLT 219   < > 169 161   < > = values in this interval not displayed.   Cardiac  Enzymes: No results for input(s): CKTOTAL, CKMB, CKMBINDEX, TROPONINI in the last 72 hours. BNP: Invalid input(s): POCBNP D-Dimer: No results for input(s): DDIMER in the last 72 hours. Hemoglobin A1C: No results for input(s): HGBA1C in the last 72 hours. Fasting Lipid Panel: No results for input(s): CHOL, HDL, LDLCALC, TRIG, CHOLHDL, LDLDIRECT in the last 72 hours. Thyroid Function Tests: Recent Labs    01/24/20 1654  TSH 2.069   Anemia Panel: Recent Labs    01/24/20 1654  VITAMINB12 785    ECHOCARDIOGRAM LIMITED  Result Date: 01/24/2020    ECHOCARDIOGRAM LIMITED REPORT   Patient Name:   Justin Griffith Date of Exam: 01/24/2020 Medical Rec #:  [id number]      Height:       71.0 in Accession #:    [id number]     Weight:       290.8 lb Date of Birth:  1940-12-02      BSA:          2.472 m Patient Age:  79 years       BP:           not listed in chart/not listed in                                              chart mmHg Patient Gender: M              HR:           Not listed in chart bpm. Exam Location:  [facility] Procedure: Limited Echo, Color Doppler and Cardiac Doppler Indications:     Elevated troponin  History:         Patient has prior history of Echocardiogram examinations, most                  recent 12/16/2019. COPD; Risk Factors:Sleep Apnea, Hypertension                  and Diabetes. History of tobacco use.  Sonographer:     Sherrie Sport RDCS (AE) Referring Phys:  [phone] A CALDWELL POWELL JR Diagnosing Phys: Kate Sable MD  Sonographer Comments: Technically difficult study due to poor echo windows, suboptimal parasternal window and no apical window. The best view was subcostal view. IMPRESSIONS  1. Left ventricular ejection fraction, by estimation, is 60 to 65%. The left ventricle has normal function. The left ventricle has no regional wall motion abnormalities. Left ventricular diastolic function could not be evaluated.  2. Right ventricular systolic function is normal. The right  ventricular size is normal.  3. The mitral valve is normal in structure. No evidence of mitral valve regurgitation. FINDINGS  Left Ventricle: Left ventricular ejection fraction, by estimation, is 60 to 65%. The left ventricle has normal function. The left ventricle has no regional wall motion abnormalities. The left ventricular internal cavity size was normal in size. Left ventricular diastolic function could not be evaluated. Right Ventricle: The right ventricular size is normal. Right ventricular systolic function is normal. Pericardium: There is no evidence of pericardial effusion. Mitral Valve: The mitral valve is normal in structure. Tricuspid Valve: The tricuspid valve is normal in structure. Tricuspid valve regurgitation is not demonstrated. Pulmonic Valve: The pulmonic valve was not assessed. Aorta: The aortic root is normal in size and structure. Venous: The inferior vena cava was not well visualized. LEFT VENTRICLE PLAX 2D LVIDd:         4.25 cm LVIDs:         2.89 cm LV PW:         1.46 cm LV IVS:        1.44 cm LVOT diam:     2.00 cm LVOT Area:     3.14 cm  LEFT ATRIUM         Index LA diam:    3.00 cm 1.21 cm/m   AORTA Ao Root diam: 3.10 cm  SHUNTS Systemic Diam: 2.00 cm Kate Sable MD Electronically signed by Kate Sable MD Signature Date/Time: 01/24/2020/12:56:21 PM    Final    DG FLUORO GUIDED LOC OF NEEDLE/CATH TIP FOR SPINAL INJECT LT  Result Date: 01/26/2020 CLINICAL DATA:  Systemic inflammatory response syndrome. EXAM: DIAGNOSTIC LUMBAR PUNCTURE UNDER FLUOROSCOPIC GUIDANCE FLUOROSCOPY TIME:  Fluoroscopy Time:  0 minutes 24 seconds Radiation Exposure Index (if provided by the fluoroscopic device): 21.0 mGy Number of Acquired Spot Images: 1. PROCEDURE: Informed consent  was obtained from the patient prior to the procedure, including potential complications of headache, allergy, and pain. With the patient prone, the lower back was prepped with Betadine. 1% Lidocaine was used for  local anesthesia. Lumbar puncture was performed at the L4-L5 level using a 22 gauge needle with return of clear CSF with an opening pressure of 15 cm water. 10 ml of CSF were obtained for laboratory studies. The patient tolerated the procedure well and there were no apparent complications. IMPRESSION: Successful fluoroscopically directed lumbar puncture. Electronically Signed   By: Marcello Moores  Register   On: 01/26/2020 09:02     Echo: LVEF 60-65% with no wall motion abnormalities.   ASSESSMENT AND PLAN:  Principal Problem:   Sepsis ([place]) Active Problems:   Hypertension associated with diabetes ([place])   Type 2 diabetes mellitus with proteinuria (H[facility])   Hyperlipidemia associated with type 2 diabetes mellitus (H[facility])   Sleep apnea   BPH (benign prostatic hyperplasia)   COPD (chronic obstructive pulmonary disease) (HCC)   Elevated troponin   Coronary artery disease   History of CVA (cerebrovascular accident)   Gross hematuria    1. Elevated troponin down trending (387 -> 355 -> 144) likely due to demand supply ischemia in the setting of sepsis. Patient has history of CAD with 50% stenosis of LAD in 2017 treated medically. Echocardiogram shows normal left ventricular function with LVEF 60-65% and no wall motion abnormalities. ECG without evidence of ischemia. Patient denies chest pain. 2. Coronary artery disease, with 50% stenosis of LAD per cardiac catheterization in 2017, treated medically. Patient is medically optimized on aspirin 324 mg, carvedilol, irbesartan, and rosuvastatin. 3. Sepsis of uncertain etiology, presenting with tachycardia, tachypneia, febrile and with leukocytosis of 13.7. He is being treated with IV antibiotics. 4. BPH with gross hematuria, urology following. Felt to be secondary to foley trauma. 5. Hypertension mildly elevated on irbesartan and carvedilol. Continue to monitor. 6. Severe COPD, on chronic 3L O2  7. History of CVA, 11/2019 on aspirin and statin 8. OSA, CPAP  compliant  Recommendations: 1. Defer further cardiac diagnostics at this time as patient denies chest pain, 2D echocardiogram reveals normal left ventricular function with no wall motion abnormalities, ECG without evidence of acute ischemia, and troponin is down-trending.  2. Continue carvedilol, Crestor, and irbesartan for hypertension/CAD.  3. Continue aspirin at this time; monitor for worsening/recurrent hematuria. 4. Treatment of sepsis and COPD per hospitalist.   Sign off for now. Please call with any questions.  Clabe Seal, PA-C 01/26/2020 9:13 AM

## 2020-01-26 NOTE — Plan of Care (Signed)

## 2020-01-27 DIAGNOSIS — A419 Sepsis, unspecified organism: Secondary | ICD-10-CM | POA: Diagnosis not present

## 2020-01-27 LAB — GLUCOSE, CAPILLARY
Glucose-Capillary: 153 mg/dL — ABNORMAL HIGH (ref 70–99)
Glucose-Capillary: 202 mg/dL — ABNORMAL HIGH (ref 70–99)
Glucose-Capillary: 152 mg/dL — ABNORMAL HIGH (ref 70–99)
Glucose-Capillary: 185 mg/dL — ABNORMAL HIGH (ref 70–99)

## 2020-01-27 LAB — COMPREHENSIVE METABOLIC PANEL
ALT: 17 U/L (ref 0–44)
AST: 23 U/L (ref 15–41)
Albumin: 2.7 g/dL — ABNORMAL LOW (ref 3.5–5.0)
Alkaline Phosphatase: 55 U/L (ref 38–126)
Anion gap: 7 (ref 5–15)
BUN: 23 mg/dL (ref 8–23)
CO2: 29 mmol/L (ref 22–32)
Calcium: 9.8 mg/dL (ref 8.9–10.3)
Chloride: 96 mmol/L — ABNORMAL LOW (ref 98–111)
Creatinine, Ser: 1.12 mg/dL (ref 0.61–1.24)
GFR, Estimated: >60 mL/min (ref >60)
Glucose, Bld: 189 mg/dL — ABNORMAL HIGH (ref 70–99)
Potassium: 4.4 mmol/L (ref 3.5–5.1)
Sodium: 132 mmol/L — ABNORMAL LOW (ref 135–145)
Total Bilirubin: 0.5 mg/dL (ref 0.3–1.2)
Total Protein: 6.2 g/dL — ABNORMAL LOW (ref 6.5–8.1)

## 2020-01-27 LAB — CBC WITH DIFFERENTIAL/PLATELET
Abs Immature Granulocytes: 0.06 10*3/uL (ref 0.00–0.07)
Basophils Absolute: 0.1 10*3/uL (ref 0.0–0.1)
Basophils Relative: 1 %
Eosinophils Absolute: 0.2 10*3/uL (ref 0.0–0.5)
Eosinophils Relative: 2 %
HCT: 38.4 % — ABNORMAL LOW (ref 39.0–52.0)
Hemoglobin: 13.1 g/dL (ref 13.0–17.0)
Immature Granulocytes: 1 %
Lymphocytes Relative: 19 %
Lymphs Abs: 1.2 10*3/uL (ref 0.7–4.0)
MCH: 30.6 pg (ref 26.0–34.0)
MCHC: 34.1 g/dL (ref 30.0–36.0)
MCV: 89.7 fL (ref 80.0–100.0)
Monocytes Absolute: 1.1 10*3/uL — ABNORMAL HIGH (ref 0.1–1.0)
Monocytes Relative: 16 %
Neutro Abs: 4 10*3/uL (ref 1.7–7.7)
Neutrophils Relative %: 61 %
Platelets: 197 10*3/uL (ref 150–400)
RBC: 4.28 MIL/uL (ref 4.22–5.81)
RDW: 12.7 % (ref 11.5–15.5)
WBC: 6.6 10*3/uL (ref 4.0–10.5)
nRBC: 0 % (ref 0.0–0.2)

## 2020-01-27 LAB — MAGNESIUM: Magnesium: 2.3 mg/dL (ref 1.7–2.4)

## 2020-01-27 LAB — PHOSPHORUS: Phosphorus: 2 mg/dL — ABNORMAL LOW (ref 2.5–4.6)

## 2020-01-27 NOTE — Progress Notes (Addendum)
Justin Griffith  AST:[id number] DOB: 1940-06-14 DOA: 01/23/2020 PCP: Patient, No Pcp Per   Chief Complaint  Patient presents with   Shortness of Breath    Brief Narrative: Justin Griffith 79 y.o.malewith medical history significant forCOPD, chronic respiratory failure with hypoxia on 3 L of home O2 via [place], CAD, PVD with chronic venous stasisdermatitis, history of CVA, T2DM, HTN, HLD, BPH, essential tremor, and OSA on CPAP whois admitted with sepsis without [place]lander [place] clear source.  Assessment & Plan:   Principal Problem:   Sepsis ([place]) Active Problems:   Hypertension associated with diabetes ([place])   Type 2 diabetes mellitus with proteinuria (HCC)   Hyperlipidemia associated with type 2 diabetes mellitus (HCC)   Sleep apnea   BPH (benign prostatic hyperplasia)   COPD (chronic obstructive pulmonary disease) (HCC)   Elevated troponin   Coronary artery disease   History of CVA (cerebrovascular accident)   Gross hematuria  SIRS with unspecified source: Patient presenting on arrival with fever, tachycardia, tachypnea, and leukocytosis.  No obvious infectious source found at this time. CT chest/abdomen/pelvis without acute abnormalities - mild perinephric stranding (see report). Urinalysis with rare bacteria microscopy. SARS-CoV-2 and influenza Summer Mccolgan/B PCR's are negative.  - source continues to be unclear, though he seems to have improved on abx.  Possible bacterial infection vs viral?   - continue ceftriaxone for now, will d/c flagyl  --MRSA screen neg, vanc d/c'ed --procal 0.87 on 11/3, trend -- LP with 3 WBC's, RBC 26, glucose 88, total protein 67 - not c/w bacterial meningitis, elevated protein could be related to diabetes -> follow HSV pcr, though low suspicion, will not start antiviral at this time, especially with his clinical improvement (discussed LP with neuro) - follow HSV PCR (pending) -- follow CSF cx NG -- urine cx NG, blood cx NGTD  Somnolence, possible  acute encephalopathy --Pt could barely stay awake.  Not on sedating medications.  VBG without hypercarbia.  Ammonia 19.  Follow B12 (wnl), TSH (wnl).  -- improved today, unclear cause at this point, he notes some chronic sleepiness - continue cpap nightly  -- follow outpatient with neurology for his chronic sleepiness, though this seems improved today and yesterday  elevated troponin 2/2 demand ischemia ACS ruled out Hx of CAD High-sensitivity troponin initially elevated 387 with downward trend on repeat 355. Patient does have history of CAD. He denies any recent chest pain.Suspect demand ischemia in setting of possible sepsis. - echo without RWMA, EF 60-65% PLAN: --cardiology consult - no recommendation for additional cardiac diagnostics - continue coreg, irbesartan, and crestor, continue aspirin  BPH with gross hematuria: --urology consulted.  Hematuria suspected 2/2 foley trauma --foley d/c'ed PLAN: --cont flomax --Monitor urine output -- follow with urology outpatient  Falls at home/generalized weakness: Reports multiple falls at home with no significant injury. -Request PT/OT eval -Continue fall precautions  COPD chronic respiratory failure with hypoxia on home 3L  Stable without acute exacerbation. --cont Pulmicort neb and DuoNeb --cont home suppl O2  Hypomagnesemia: --Monitor and replete PRN  History of CVA: Punctate acute 3 mm acute ischemic nonhemorrhagic cortical infarct involving the right occipital lobe noted on last admission with MRI brain 12/14/2019. -Continue aspirin and statin  Type 2 diabetes: A1c 7.5 on 12/15/2019. --cont Lantus 25u nightly --SSI  Hypertension: BP improved -Continue Coreg 25 mg twice daily -Continue olmesartan 40 mg daily (switchedto irbesartan per pharmacy formulary while in hospital) -Holding home Lasix for now -DC losartan from home meds -  Hold clonidine, not clear if he has been taking this  regularly  Hyperlipidemia: Continue rosuvastatin.  OSA: Continue CPAP nightly.  DVT prophylaxis: SCD Code Status:full  Family Communication: none at bedside -- 11/6 daughter Disposition:   Status is: Inpatient  Remains inpatient appropriate because:Inpatient level of care appropriate due to severity of illness   Dispo: The patient is from: Home              Anticipated d/c is to: pending              Anticipated d/c date is: > 3 days              Patient currently is not medically stable to d/c.   Consultants:   Cardiology  IR  Procedures:  Echo IMPRESSIONS    1. Left ventricular ejection fraction, by estimation, is 60 to 65%. The  left ventricle has normal function. The left ventricle has no regional  wall motion abnormalities. Left ventricular diastolic function could not  be evaluated.  2. Right ventricular systolic function is normal. The right ventricular  size is normal.  3. The mitral valve is normal in structure. No evidence of mitral valve  regurgitation.   11/5 IMPRESSION: Successful fluoroscopically directed lumbar puncture.  Antimicrobials:  Anti-infectives (From admission, onward)   Start     Dose/Rate Route Frequency Ordered Stop   01/26/20 0800  metroNIDAZOLE (FLAGYL) tablet 500 mg  Status:  Discontinued        500 mg Oral Every 8 hours 01/25/20 1506 01/25/20 1647   01/25/20 2200  metroNIDAZOLE (FLAGYL) tablet 500 mg  Status:  Discontinued        500 mg Oral Every 8 hours 01/25/20 1647 01/26/20 1826   01/25/20 2200  cefTRIAXone (ROCEPHIN) 2 g in sodium chloride 0.9 % 100 mL IVPB        2 g 200 mL/hr over 30 Minutes Intravenous Every 24 hours 01/25/20 1647     01/24/20 0600  vancomycin (VANCOCIN) IVPB 1000 mg/200 mL premix  Status:  Discontinued        1,000 mg 200 mL/hr over 60 Minutes Intravenous Every 12 hours 01/23/20 2010 01/24/20 1917   01/23/20 2200  ceFEPIme (MAXIPIME) 2 g in sodium chloride 0.9 % 100 mL IVPB  Status:   Discontinued        2 g 200 mL/hr over 30 Minutes Intravenous Every 8 hours 01/23/20 2010 01/25/20 1647   01/23/20 2000  metroNIDAZOLE (FLAGYL) IVPB 500 mg  Status:  Discontinued        500 mg 100 mL/hr over 60 Minutes Intravenous Every 8 hours 01/23/20 1941 01/25/20 1647   01/23/20 1300  vancomycin (VANCOCIN) IVPB 1000 mg/200 mL premix       "Followed by" Linked Group Details   1,000 mg 200 mL/hr over 60 Minutes Intravenous  Once 01/23/20 1250 01/23/20 1459   01/23/20 1300  vancomycin (VANCOREADY) IVPB 1500 mg/300 mL       "Followed by" Linked Group Details   1,500 mg 150 mL/hr over 120 Minutes Intravenous  Once 01/23/20 1250 01/23/20 1804   01/23/20 1230  vancomycin (VANCOCIN) IVPB 1000 mg/200 mL premix  Status:  Discontinued        1,000 mg 200 mL/hr over 60 Minutes Intravenous  Once 01/23/20 1227 01/23/20 1248   01/23/20 1230  ceFEPIme (MAXIPIME) 2 g in sodium chloride 0.9 % 100 mL IVPB        2 g 200 mL/hr over 30 Minutes Intravenous  Once 01/23/20 1227 01/23/20 1355      Subjective: Feeling progressively better  Objective: Vitals:   01/27/20 0748 01/27/20 1109 01/27/20 1427 01/27/20 1527  BP: (!) 181/98 (!) 152/77  (!) 179/89  Pulse: 65 (!) 58  (!) 56  Resp: 17 16  17   Temp: 97.9 F (36.6 C) 98 F (36.7 C)  97.8 F (36.6 C)  TempSrc: Oral   Oral  SpO2: 93% 98% 97% 98%  Weight:      Height:        Intake/Output Summary (Last 24 hours) at 01/27/2020 1611 Last data filed at 01/27/2020 0300 Gross per 24 hour  Intake 100 ml  Output 1750 ml  Net -1650 ml   Filed Weights   01/25/20 0157 01/26/20 0406 01/27/20 0130  Weight: (!) 137.1 kg (!) 136.7 kg (!) 137.9 kg    Examination:  General: No acute distress. Cardiovascular: Heart sounds show Ellanor Feuerstein regular rate, and rhythm Lungs: Clear to auscultation bilaterally  Abdomen: Soft, nontender, nondistended  Neurological: Alert and oriented 3. Moves all extremities 4. Cranial nerves II through XII grossly intact. Skin:  Warm and dry. No rashes or lesions. Extremities: No clubbing or cyanosis. No edema.     Data Reviewed: I have personally reviewed following labs and imaging studies  CBC: Recent Labs  Lab 01/23/20 1617 01/24/20 0431 01/25/20 0435 01/26/20 0409 01/27/20 0434  WBC 13.7* 15.3* 8.5 6.2 6.6  NEUTROABS 11.7*  --  6.8  --  4.0  HGB 15.4 13.7 13.0 12.6* 13.1  HCT 45.3 39.8 37.4* 37.5* 38.4*  MCV 91.0 90.9 90.3 91.5 89.7  PLT 219 190 169 161 709    Basic Metabolic Panel: Recent Labs  Lab 01/23/20 1617 01/24/20 0431 01/25/20 0435 01/26/20 0409 01/27/20 0434  NA 137 133* 132* 129* 132*  K 3.9 3.7 3.7 4.0 4.4  CL 98 98 98 95* 96*  CO2 25 28 27 25 29   GLUCOSE 122* 180* 146* 142* 189*  BUN 20 18 24* 30* 23  CREATININE 1.06 1.12 1.17 1.28* 1.12  CALCIUM 9.8 9.4 9.0 9.4 9.8  MG 1.6* 1.9 1.9 2.1 2.3  PHOS  --   --  2.2*  --  2.0*    GFR: Estimated Creatinine Clearance: 74.6 mL/min (by C-G formula based on SCr of 1.12 mg/dL).  Liver Function Tests: Recent Labs  Lab 01/23/20 1617 01/25/20 0435 01/26/20 0409 01/27/20 0434  AST 31 21 21 23   ALT 17 15 15 17   ALKPHOS 52 52 53 55  BILITOT 1.7* 1.1 0.6 0.5  PROT 7.1 6.0* 5.8* 6.2*  ALBUMIN 3.4* 2.6* 2.4* 2.7*    CBG: Recent Labs  Lab 01/26/20 1144 01/26/20 1535 01/26/20 2110 01/27/20 0744 01/27/20 1150  GLUCAP 198* 204* 176* 153* 202*     Recent Results (from the past 240 hour(s))  Urine culture     Status: None   Collection Time: 01/23/20 12:26 PM   Specimen: Urine, Random  Result Value Ref Range Status   Specimen Description   Final    URINE, RANDOM Performed at [facility], [address] [place], [place] [address]    Special Requests   Final    NONE Performed at [facility], [address], [place], [place] [address]    Culture   Final    NO GROWTH Performed at [facility], [place] [address] [place], [place] [address]    Report Status 01/24/2020 FINAL  Final   Respiratory Panel by RT  PCR (Flu Deshay Kirstein&B, Covid) - Nasopharyngeal Swab     Status: None   Collection Time: 01/23/20  1:02 PM   Specimen: Nasopharyngeal Swab  Result Value Ref Range Status   SARS Coronavirus 2 by RT PCR NEGATIVE NEGATIVE Final    Comment: (NOTE) SARS-CoV-2 target nucleic acids are NOT DETECTED.  The SARS-CoV-2 RNA is generally detectable in upper respiratoy specimens during the acute phase of infection. The lowest concentration of SARS-CoV-2 viral copies this assay can detect is 131 copies/mL. Erminia Mcnew negative result does not preclude SARS-Cov-2 infection and should not be used as the sole basis for treatment or other patient management decisions. Rene Gonsoulin negative result may occur with  improper specimen collection/handling, submission of specimen other than nasopharyngeal swab, presence of viral mutation(s) within the areas targeted by this assay, and inadequate number of viral copies (<131 copies/mL). Beryle Bagsby negative result must be combined with clinical observations, patient history, and epidemiological information. The expected result is Negative.  Fact Sheet for Patients:  PinkCheek.be  Fact Sheet for Healthcare Providers:  GravelBags.it  This test is no t yet approved or cleared by the [place] FDA and  has been authorized for detection and/or diagnosis of SARS-CoV-2 by FDA under an Emergency Use Authorization (EUA). This EUA will remain  in effect (meaning this test can be used) for the duration of the COVID-19 declaration under Section 564(b)(1) of the Act, 21 U.S.C. section 360bbb-3(b)(1), unless the authorization is terminated or revoked sooner.     Influenza Brecken Walth by PCR NEGATIVE NEGATIVE Final   Influenza B by PCR NEGATIVE NEGATIVE Final    Comment: (NOTE) The Xpert Xpress SARS-CoV-2/FLU/RSV assay is intended as an aid in  the diagnosis of influenza from Nasopharyngeal swab specimens and  should not be used as  Enes Wegener sole basis for treatment. Nasal washings and  aspirates are unacceptable for Xpert Xpress SARS-CoV-2/FLU/RSV  testing.  Fact Sheet for Patients: PinkCheek.be  Fact Sheet for Healthcare Providers: GravelBags.it  This test is not yet approved or cleared by the [place] FDA and  has been authorized for detection and/or diagnosis of SARS-CoV-2 by  FDA under an Emergency Use Authorization (EUA). This EUA will remain  in effect (meaning this test can be used) for the duration of the  Covid-19 declaration under Section 564(b)(1) of the Act, 21  U.S.C. section 360bbb-3(b)(1), unless the authorization is  terminated or revoked. Performed at [facility], [address] [place], [place] [address]   Blood Culture (routine x 2)     Status: None (Preliminary result)   Collection Time: 01/23/20  4:16 PM   Specimen: BLOOD  Result Value Ref Range Status   Specimen Description BLOOD BLOOD LEFT HAND  Final   Special Requests   Final    BOTTLES DRAWN AEROBIC ONLY Blood Culture results may not be optimal due to an inadequate volume of blood received in culture bottles   Culture   Final    NO GROWTH 4 DAYS Performed at [facility], [address] [place], [place] [address]    Report Status PENDING  Incomplete  Blood Culture (routine x 2)     Status: None (Preliminary result)   Collection Time: 01/23/20  4:16 PM   Specimen: BLOOD  Result Value Ref Range Status   Specimen Description BLOOD BLOOD RIGHT HAND  Final   Special Requests   Final    BOTTLES DRAWN AEROBIC AND ANAEROBIC Blood Culture adequate volume   Culture   Final  NO GROWTH 4 DAYS Performed at [facility], [address] [place], [place] [address]    Report Status PENDING  Incomplete  MRSA PCR Screening     Status: None   Collection Time: 01/24/20  5:06 PM   Specimen: Nasal Mucosa; Nasopharyngeal  Result Value Ref Range Status    MRSA by PCR NEGATIVE NEGATIVE Final    Comment:        The GeneXpert MRSA Assay (FDA approved for NASAL specimens only), is one component of Caylor Cerino comprehensive MRSA colonization surveillance program. It is not intended to diagnose MRSA infection nor to guide or monitor treatment for MRSA infections. Performed at [facility], [address] [place], [place] [address]   Respiratory Panel by PCR     Status: None   Collection Time: 01/25/20 12:10 PM   Specimen: Respiratory  Result Value Ref Range Status   Adenovirus NOT DETECTED NOT DETECTED Final   Coronavirus 229E NOT DETECTED NOT DETECTED Final    Comment: (NOTE) The Coronavirus on the Respiratory Panel, DOES NOT test for the novel  Coronavirus (2019 nCoV)    Coronavirus HKU1 NOT DETECTED NOT DETECTED Final   Coronavirus NL63 NOT DETECTED NOT DETECTED Final   Coronavirus OC43 NOT DETECTED NOT DETECTED Final   Metapneumovirus NOT DETECTED NOT DETECTED Final   Rhinovirus / Enterovirus NOT DETECTED NOT DETECTED Final   Influenza Erendida Wrenn NOT DETECTED NOT DETECTED Final   Influenza B NOT DETECTED NOT DETECTED Final   Parainfluenza Virus 1 NOT DETECTED NOT DETECTED Final   Parainfluenza Virus 2 NOT DETECTED NOT DETECTED Final   Parainfluenza Virus 3 NOT DETECTED NOT DETECTED Final   Parainfluenza Virus 4 NOT DETECTED NOT DETECTED Final   Respiratory Syncytial Virus NOT DETECTED NOT DETECTED Final   Bordetella pertussis NOT DETECTED NOT DETECTED Final   Chlamydophila pneumoniae NOT DETECTED NOT DETECTED Final   Mycoplasma pneumoniae NOT DETECTED NOT DETECTED Final    Comment: Performed at [facility], [place]. [address], [place], [place] [address]  CSF culture     Status: None (Preliminary result)   Collection Time: 01/26/20  8:43 AM   Specimen: PATH Cytology CSF; Cerebrospinal Fluid  Result Value Ref Range Status   Specimen Description   Final    CSF Performed at [facility], [address], [place], [place]  [address]    Special Requests   Final    CSF Performed at [facility], [address] [place], [place] [address]    Gram Stain   Final    NO ORGANISMS SEEN NO WBC SEEN RBC'S SEEN Performed at [facility], [address], [place], [place] [address]    Culture   Final    NO GROWTH 1 DAY Performed at [facility], [place] [address], [place], [place] [address]    Report Status PENDING  Incomplete         Radiology Studies: DG FLUORO GUIDED LOC OF NEEDLE/CATH TIP FOR SPINAL INJECT LT  Result Date: 01/26/2020 CLINICAL DATA:  Systemic inflammatory response syndrome. EXAM: DIAGNOSTIC LUMBAR PUNCTURE UNDER FLUOROSCOPIC GUIDANCE FLUOROSCOPY TIME:  Fluoroscopy Time:  0 minutes 24 seconds Radiation Exposure Index (if provided by the fluoroscopic device): 21.0 mGy Number of Acquired Spot Images: 1. PROCEDURE: Informed consent was obtained from the patient prior to the procedure, including potential complications of headache, allergy, and pain. With the patient prone, the lower back was prepped with Betadine. 1% Lidocaine was used for local anesthesia. Lumbar  puncture was performed at the L4-L5 level using Jontavius Rabalais 22 gauge needle with return of clear CSF with an opening pressure of 15 cm water. 10 ml of CSF were obtained for laboratory studies. The patient tolerated the procedure well and there were no apparent complications. IMPRESSION: Successful fluoroscopically directed lumbar puncture. Electronically Signed   By: Marcello Moores  Register   On: 01/26/2020 09:02        Scheduled Meds:  aspirin  324 mg Oral Daily   budesonide (PULMICORT) nebulizer solution  0.25 mg Nebulization BID   carvedilol  25 mg Oral BID WC   Chlorhexidine Gluconate Cloth  6 each Topical Daily   insulin aspart  0-9 Units Subcutaneous TID WC   insulin glargine  25 Units Subcutaneous QHS   ipratropium-albuterol  3 mL Nebulization TID   irbesartan  300 mg Oral Daily   mouth rinse  15 mL Mouth Rinse  BID   rosuvastatin  40 mg Oral QHS   sodium chloride flush  3 mL Intravenous Q12H   tamsulosin  0.4 mg Oral Daily   Continuous Infusions:  cefTRIAXone (ROCEPHIN)  IV 2 g (01/26/20 2245)     LOS: 4 days    Time spent: over 30 min    Fayrene Helper, MD [facility]   To contact the attending provider between 7A-7P or the covering provider during after hours 7P-7A, please log into the web site [email or web address] and access using universal [place] password for that web site. If you do not have the password, please call the hospital operator.  01/27/2020, 4:11 PM

## 2020-01-27 NOTE — Evaluation (Signed)
Occupational Therapy Evaluation Patient Details Name: Justin Griffith MRN: [id number] DOB: [birth year] Today's Date: 01/27/2020    History of Present Illness Justin Griffith is a 79 y.o. male with medical history significant for COPD, chronic respiratory failure with hypoxia on 3 L of home O2 via [place], CAD, PVD with chronic venous stasis dermatitis, history of CVA, T2DM, HTN, HLD, BPH, essential tremor, and OSA on CPAP who presents to the ED for evaluation of shortness of breath and fevers.   Clinical Impression   Pt sitting up in bed - wanting to use bathroom. Pt on 2L/min supplemental oxygen via nasal cannula. Pt remove O2 reporting he do not use it at home , only CPAP while getting up on EOB - sit<> stand independent but need S because of some unsteadiness- holding onto furniture , doorframe and sink -  O2 during session stays about 94 -94%  and did educated pt on  pursed lip breathing.  Pt UE strength WNL and sitting balance on EOB  Did not show any LOB - pt modified independent in dressing - using slip in shoes and elastic pants - has  Walk in shower. Pt does all the cooking, cleaning and groceries shopping- decline meal on wheels- but report his D-I-L can maybe help him some -and would prefer HH therapy like in past. Will cont to address deficits for Functional mobility , safety, AE if appropriate to increase independent in ADL's and IADL's - cont to focus on safety and cardiopulmonary     Follow Up Recommendations  Home health OT    Equipment Recommendations       Recommendations for Other Services       Precautions / Restrictions Precautions Precautions: Fall Restrictions Weight Bearing Restrictions: No      Mobility Bed Mobility Overal bed mobility: Independent Bed Mobility: Supine to Sit     Supine to sit: HOB elevated;Modified independent (Device/Increase time)     General bed mobility comments: HOB elevated, bed rails    Transfers Overall transfer level: Needs  assistance Equipment used: Rolling walker (2 wheeled) Transfers: Sit to/from Stand Sit to Stand: Supervision         General transfer comment: supervision sit<> stand -but impulsive and holding onto furniture to bathroom    Balance Overall balance assessment: Independent Sitting-balance support: Feet supported Sitting balance-Leahy Scale: Normal Sitting balance - Comments: reach down to floor to pick up something   Standing balance support: Bilateral upper extremity supported Standing balance-Leahy Scale: Fair Standing balance comment: can briefly stand staticly without BUE support & close supervision.                           ADL either performed or assessed with clinical judgement   ADL                                         General ADL Comments: Per pt walk in shower, bathing independent -and did slip in shoes , elastic pants , over head shirts - wife has alzheimers - he does cooking, shopping groceries and cleaning     Vision Baseline Vision/History: Wears glasses Wears Glasses: Reading only Patient Visual Report: No change from baseline       Perception     Praxis      Pertinent Vitals/Pain Pain Assessment: Faces Faces Pain Scale: Hurts a little  bit Pain Location: Head per pt Pain Descriptors / Indicators: Aching     Hand Dominance Right   Extremity/Trunk Assessment Upper Extremity Assessment Upper Extremity Assessment: Overall WFL for tasks assessed   Lower Extremity Assessment Lower Extremity Assessment: Defer to PT evaluation       Communication Communication Communication: HOH (hear better in L ear)   Cognition Arousal/Alertness: Awake/alert Behavior During Therapy: WFL for tasks assessed/performed Overall Cognitive Status: Within Functional Limits for tasks assessed                                 General Comments: impulsive in mobility - getting out of bed to bathroom- per nsg - pt urinated in  corner of room ?   General Comments       Exercises Other Exercises Other Exercises: Pt quick to jump out of bed and into bathroom - holding on doorway, sink and railing- initially sit . stand some unsteadiness observe - pt ed on waiting when changing positions -   Shoulder Instructions      Home Living Family/patient expects to be discharged to:: Skilled nursing facility Living Arrangements: Spouse/significant other                               Additional Comments: wife has alzheimers      Prior Functioning/Environment Level of Independence: Independent        Comments: Pt did cooking , groceries shopping and cleaning of house        OT Problem List: Decreased strength;Decreased activity tolerance;Impaired balance (sitting and/or standing);Decreased safety awareness;Decreased knowledge of use of DME or AE      OT Treatment/Interventions: Self-care/ADL training;Neuromuscular education;DME and/or AE instruction;Patient/family education;Balance training    OT Goals(Griffith goals can be found in the care plan section) Acute Rehab OT Goals Patient Stated Goal: Want to go home- get [facility] again like last time OT Goal Formulation: With patient Time For Goal Achievement: 02/10/20 Potential to Achieve Goals: Good  OT Frequency: Min 1X/week   Barriers to D/C:    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       Co-evaluation              AM-PAC OT "6 Clicks" Daily Activity     Outcome Measure Help from another person eating meals?: None Help from another person taking care of personal grooming?: None Help from another person toileting, which includes using toliet, bedpan, or urinal?: A Little Help from another person bathing (including washing, rinsing, drying)?: A Little Help from another person to put on and taking off regular upper body clothing?: None Help from another person to put on and taking off regular lower body clothing?: A Little 6 Click Score: 21   End of  Session Equipment Utilized During Treatment: Gait belt Nurse Communication: Mobility status (O2 levels without O2 stayed up - 93% to 94%)  Activity Tolerance: Patient tolerated treatment well Patient left: in bed;with nursing/sitter in room;with bed alarm set  OT Visit Diagnosis: Unsteadiness on feet (R26.81);Repeated falls (R29.6);Muscle weakness (generalized) (M62.81);History of falling (Z91.81);Dizziness and giddiness (R42)                Time: 1040-1108 OT Time Calculation (min): 28 min Charges:  OT General Charges $OT Visit: 1 Visit OT Evaluation $OT Eval Low Complexity: 1 Low    Lonzo Saulter OTR/L,CLT 01/27/2020, 2:58 PM

## 2020-01-27 NOTE — Progress Notes (Addendum)
Physical Therapy Treatment Patient Details Name: Justin Griffith MRN: [id number] DOB: 07/02/1940 Today's Date: 01/27/2020    History of Present Illness Justin Griffith is a 79 y.o. male with medical history significant for COPD, chronic respiratory failure with hypoxia on 3 L of home O2 via [place], CAD, PVD with chronic venous stasis dermatitis, history of CVA, T2DM, HTN, HLD, BPH, essential tremor, and OSA on CPAP who presents to the ED for evaluation of shortness of breath and fevers.    PT Comments    Pt received asleep in bed but awakens with gentle encouragement. Pt on 2L/min supplemental oxygen via nasal cannula throughout session & SPO2 >90% throughout despite pt c/o SOB after gait - PT educates pt on pursed lip breathing & feelings of SOB dissipate with rest. Pt ambulates 60 ft + 80 ft with RW & CGA progressing to close supervision with seated rest break in between. Pt performs 10x sit<>stand with cuing for technique & focus on general strengthening. Due to pt's progress, updating PT d/c recommendations to HHPT if pt's family can provide assist for mobility. Will continue to follow pt acutely to focus on cardiopulmonary endurance training & balance.     Follow Up Recommendations  Supervision for mobility/OOB;Home health PT     Equipment Recommendations  Rolling walker with 5" wheels;3in1 (PT)    Recommendations for Other Services       Precautions / Restrictions Precautions Precautions: Fall Restrictions Weight Bearing Restrictions: No    Mobility  Bed Mobility Overal bed mobility: Modified Independent Bed Mobility: Supine to Sit     Supine to sit: HOB elevated;Modified independent (Device/Increase time)     General bed mobility comments: HOB elevated, bed rails  Transfers     Transfers: Sit to/from Stand Sit to Stand: Min guard            Ambulation/Gait Ambulation/Gait assistance: Supervision;Min guard Gait Distance (Feet): 80 Feet Assistive device: Rolling  walker (2 wheeled) Gait Pattern/deviations: Step-through pattern Gait velocity: decreasd   General Gait Details: forward lean onto RW   Stairs             Wheelchair Mobility    Modified Rankin (Stroke Patients Only)       Balance Overall balance assessment: Needs assistance;Mild deficits observed, not formally tested Sitting-balance support: Feet supported Sitting balance-Leahy Scale: Good       Standing balance-Leahy Scale: Fair Standing balance comment: can briefly stand staticly without BUE support & close supervision.                            Cognition Arousal/Alertness: Awake/alert Behavior During Therapy: WFL for tasks assessed/performed Overall Cognitive Status: Within Functional Limits for tasks assessed                                        Exercises      General Comments        Pertinent Vitals/Pain Pain Assessment: Faces Faces Pain Scale: No hurt    Home Living                      Prior Function            PT Goals (current goals can now be found in the care plan section) Acute Rehab PT Goals Patient Stated Goal: Pt wants to be able to  walk better and go home. PT Goal Formulation: With patient Time For Goal Achievement: 02/07/20 Potential to Achieve Goals: Good Progress towards PT goals: Progressing toward goals    Frequency    Min 2X/week      PT Plan Discharge plan needs to be updated    Co-evaluation              AM-PAC PT "6 Clicks" Mobility   Outcome Measure  Help needed turning from your back to your side while in a flat bed without using bedrails?: None Help needed moving from lying on your back to sitting on the side of a flat bed without using bedrails?: None Help needed moving to and from a bed to a chair (including a wheelchair)?: A Little Help needed standing up from a chair using your arms (e.g., wheelchair or bedside chair)?: A Little Help needed to walk in  hospital room?: A Little Help needed climbing 3-5 steps with a railing? : A Lot 6 Click Score: 19    End of Session Equipment Utilized During Treatment: Gait belt;Oxygen Activity Tolerance: Patient tolerated treatment well Patient left: in chair;with call bell/phone within reach;with chair alarm set Nurse Communication:  (need to use restroom) PT Visit Diagnosis: Unsteadiness on feet (R26.81);Other abnormalities of gait and mobility (R26.89);Repeated falls (R29.6);Muscle weakness (generalized) (M62.81);History of falling (Z91.81);Difficulty in walking, not elsewhere classified (R26.2)     Time: 6378-5885 PT Time Calculation (min) (ACUTE ONLY): 26 min  Charges:  $Therapeutic Activity: 23-37 mins                     Lavone Nian, PT, DPT 01/27/20, 1:56 PM    Justin Griffith 01/27/2020, 1:54 PM

## 2020-01-27 NOTE — Plan of Care (Signed)
  Problem: Health Behavior/Discharge Planning: Goal: Ability to manage health-related needs will improve Outcome: Progressing   

## 2020-01-27 NOTE — Plan of Care (Signed)
y

## 2020-01-27 NOTE — Progress Notes (Signed)
Patient has refused cpap for the night 

## 2020-01-28 DIAGNOSIS — A419 Sepsis, unspecified organism: Secondary | ICD-10-CM | POA: Diagnosis not present

## 2020-01-28 LAB — CBC WITH DIFFERENTIAL/PLATELET
Abs Immature Granulocytes: 0.08 10*3/uL — ABNORMAL HIGH (ref 0.00–0.07)
Basophils Absolute: 0.1 10*3/uL (ref 0.0–0.1)
Basophils Relative: 1 %
Eosinophils Absolute: 0.1 10*3/uL (ref 0.0–0.5)
Eosinophils Relative: 2 %
HCT: 36.6 % — ABNORMAL LOW (ref 39.0–52.0)
Hemoglobin: 12.6 g/dL — ABNORMAL LOW (ref 13.0–17.0)
Immature Granulocytes: 1 %
Lymphocytes Relative: 21 %
Lymphs Abs: 1.4 10*3/uL (ref 0.7–4.0)
MCH: 31.2 pg (ref 26.0–34.0)
MCHC: 34.4 g/dL (ref 30.0–36.0)
MCV: 90.6 fL (ref 80.0–100.0)
Monocytes Absolute: 1 10*3/uL (ref 0.1–1.0)
Monocytes Relative: 14 %
Neutro Abs: 4 10*3/uL (ref 1.7–7.7)
Neutrophils Relative %: 61 %
Platelets: 197 10*3/uL (ref 150–400)
RBC: 4.04 MIL/uL — ABNORMAL LOW (ref 4.22–5.81)
RDW: 12.6 % (ref 11.5–15.5)
WBC: 6.6 10*3/uL (ref 4.0–10.5)
nRBC: 0 % (ref 0.0–0.2)

## 2020-01-28 LAB — URINALYSIS, COMPLETE (UACMP) WITH MICROSCOPIC
Bacteria, UA: NONE SEEN
Bilirubin Urine: NEGATIVE
Glucose, UA: 50 mg/dL — AB
Ketones, ur: NEGATIVE mg/dL
Nitrite: NEGATIVE
Protein, ur: 100 mg/dL — AB
Specific Gravity, Urine: 1.006 (ref 1.005–1.030)
Squamous Epithelial / HPF: NONE SEEN (ref 0–5)
pH: 6 (ref 5.0–8.0)

## 2020-01-28 LAB — COMPREHENSIVE METABOLIC PANEL
ALT: 20 U/L (ref 0–44)
AST: 28 U/L (ref 15–41)
Albumin: 2.7 g/dL — ABNORMAL LOW (ref 3.5–5.0)
Alkaline Phosphatase: 50 U/L (ref 38–126)
Anion gap: 6 (ref 5–15)
BUN: 14 mg/dL (ref 8–23)
CO2: 29 mmol/L (ref 22–32)
Calcium: 9.4 mg/dL (ref 8.9–10.3)
Chloride: 101 mmol/L (ref 98–111)
Creatinine, Ser: 0.95 mg/dL (ref 0.61–1.24)
GFR, Estimated: >60 mL/min (ref >60)
Glucose, Bld: 135 mg/dL — ABNORMAL HIGH (ref 70–99)
Potassium: 3.8 mmol/L (ref 3.5–5.1)
Sodium: 136 mmol/L (ref 135–145)
Total Bilirubin: 0.5 mg/dL (ref 0.3–1.2)
Total Protein: 6 g/dL — ABNORMAL LOW (ref 6.5–8.1)

## 2020-01-28 LAB — MAGNESIUM: Magnesium: 2.2 mg/dL (ref 1.7–2.4)

## 2020-01-28 LAB — GLUCOSE, CAPILLARY
Glucose-Capillary: 141 mg/dL — ABNORMAL HIGH (ref 70–99)
Glucose-Capillary: 174 mg/dL — ABNORMAL HIGH (ref 70–99)
Glucose-Capillary: 183 mg/dL — ABNORMAL HIGH (ref 70–99)
Glucose-Capillary: 181 mg/dL — ABNORMAL HIGH (ref 70–99)

## 2020-01-28 LAB — PHOSPHORUS: Phosphorus: 2.9 mg/dL (ref 2.5–4.6)

## 2020-01-28 NOTE — Progress Notes (Addendum)
Physical Therapy Treatment Patient Details Name: Justin Griffith MRN: [id number] DOB: September 09, 1940 Today's Date: 01/28/2020    History of Present Illness Justin Griffith is a 79 y.o. male with medical history significant for COPD, chronic respiratory failure with hypoxia on 3 L of home O2 via [place], CAD, PVD with chronic venous stasis dermatitis, history of CVA, T2DM, HTN, HLD, BPH, essential tremor, and OSA on CPAP who presents to the ED for evaluation of shortness of breath and fevers.    PT Comments    Pt very pleasant & agreeable to tx. Pt reports he is not on supplemental oxygen except for CPAP at night so PT placed on room air & SpO2 remained 88% or > throughout session with pt able to increase saturations with cuing for pursed lip breathing. (RN made aware & requests pt remain on room air at end of session with RN planning to intermittently check on pt.)  Pt completes Berg Balance Test & scores 30/56. Patient demonstrates increased fall risk as noted by score of 30/56 on Berg Balance Scale.  (<36= high risk for falls, close to 100%; 37-45 significant >80%; 46-51 moderate >50%; 52-55 lower >25%). Educated pt on fall risk & interpretation of score with pt reporting he plans to get & use a RW at home.   Pt ambulates 3 laps around nurses station without oxygen desaturation with occasional reminders for pursed lip breathing with supervision & RW. In room, pt is able to ambulate to/from bathroom without AD & min assist, completing toilet transfer with supervision.  Pt doing well & making gains daily. At this time recommending home with supervision & HHPT.     Follow Up Recommendations  Supervision for mobility/OOB;Home health PT     Equipment Recommendations  Rolling walker with 5" wheels;3in1 (PT)    Recommendations for Other Services       Precautions / Restrictions Precautions Precautions: Fall Restrictions Weight Bearing Restrictions: No    Mobility  Bed Mobility                   Transfers Overall transfer level: Needs assistance   Transfers: Sit to/from Stand;Stand Pivot Transfers Sit to Stand: Supervision Stand pivot transfers: Supervision       General transfer comment: no AD  Ambulation/Gait Ambulation/Gait assistance: Supervision Gait Distance (Feet): 480 Feet Assistive device: Rolling walker (2 wheeled) Gait Pattern/deviations: Step-through pattern     General Gait Details: improved upright posture after PT adjusts height of RW   Stairs             Wheelchair Mobility    Modified Rankin (Stroke Patients Only)       Balance                                 Standardized Balance Assessment Standardized Balance Assessment : Berg Balance Test Berg Balance Test Sit to Stand: Needs minimal aid to stand or to stabilize (BLE support pushing back on recliner for sit>stand) Standing Unsupported: Able to stand 2 minutes with supervision Sitting with Back Unsupported but Feet Supported on Floor or Stool: Able to sit safely and securely 2 minutes Stand to Sit: Sits safely with minimal use of hands Transfers: Able to transfer with verbal cueing and /or supervision Standing Unsupported with Eyes Closed: Able to stand 10 seconds with supervision Standing Ubsupported with Feet Together: Able to place feet together independently and stand for 1 minute with supervision (  unable to get feet to touch but stands with as narrow BOS as possible) From Standing, Reach Forward with Outstretched Arm: Can reach forward >12 cm safely (5") From Standing Position, Pick up Object from Floor: Able to pick up shoe, needs supervision From Standing Position, Turn to Look Behind Over each Shoulder: Needs assist to keep from losing balance and falling (only turns head) Turn 360 Degrees: Needs close supervision or verbal cueing Standing Unsupported, Alternately Place Feet on Step/Stool: Able to complete >2 steps/needs minimal assist Standing Unsupported,  One Foot in Front: Able to take small step independently and hold 30 seconds Standing on One Leg: Unable to try or needs assist to prevent fall Total Score: 30        Cognition Arousal/Alertness: Awake/alert Behavior During Therapy: WFL for tasks assessed/performed Overall Cognitive Status: Within Functional Limits for tasks assessed                                 General Comments: pt HOH (L ear better)      Exercises      General Comments        Pertinent Vitals/Pain Pain Assessment: No/denies pain    Home Living                      Prior Function            PT Goals (current goals can now be found in the care plan section) Acute Rehab PT Goals Patient Stated Goal: Want to go home- get [facility] again like last time PT Goal Formulation: With patient Time For Goal Achievement: 02/07/20 Potential to Achieve Goals: Good Progress towards PT goals: Progressing toward goals    Frequency    Min 2X/week      PT Plan Current plan remains appropriate    Co-evaluation              AM-PAC PT "6 Clicks" Mobility   Outcome Measure  Help needed turning from your back to your side while in a flat bed without using bedrails?: None Help needed moving from lying on your back to sitting on the side of a flat bed without using bedrails?: None Help needed moving to and from a bed to a chair (including a wheelchair)?: None Help needed standing up from a chair using your arms (e.g., wheelchair or bedside chair)?: None Help needed to walk in hospital room?: A Little Help needed climbing 3-5 steps with a railing? : A Little 6 Click Score: 22    End of Session Equipment Utilized During Treatment: Gait belt Activity Tolerance: Patient tolerated treatment well Patient left: in chair;with call bell/phone within reach;with chair alarm set Nurse Communication: Mobility status (oxygen) PT Visit Diagnosis: Unsteadiness on feet (R26.81);Other abnormalities of  gait and mobility (R26.89);Repeated falls (R29.6);Muscle weakness (generalized) (M62.81);History of falling (Z91.81);Difficulty in walking, not elsewhere classified (R26.2)     Time: 6010-9323 PT Time Calculation (min) (ACUTE ONLY): 42 min  Charges:  $Therapeutic Activity: 38-52 mins                     Lavone Nian, PT, DPT 01/28/20, 1:33 PM    Waunita Schooner 01/28/2020, 1:31 PM

## 2020-01-28 NOTE — Plan of Care (Signed)
  Problem: Education: Goal: Knowledge of General Education information will improve Description Including pain rating scale, medication(s)/side effects and non-pharmacologic comfort measures Outcome: Progressing   

## 2020-01-28 NOTE — Progress Notes (Signed)
PROGRESS NOTE    Justin Griffith  GGY:[id number] DOB: December 27, 1940 DOA: 01/23/2020 PCP: Patient, No Pcp Per   Chief Complaint  Patient presents with  . Shortness of Breath    Brief Narrative: Justin Griffith 79 y.o.malewith medical history significant forCOPD, chronic respiratory failure with hypoxia on 3 L of home O2 via [place], CAD, PVD with chronic venous stasisdermatitis, history of CVA, T2DM, HTN, HLD, BPH, essential tremor, and OSA on CPAP whois admitted with sepsis without Justin Griffith clear source.  Assessment & Plan:   Principal Problem:   Sepsis ([place]) Active Problems:   Hypertension associated with diabetes ([place])   Type 2 diabetes mellitus with proteinuria (HCC)   Hyperlipidemia associated with type 2 diabetes mellitus (HCC)   Sleep apnea   BPH (benign prostatic hyperplasia)   COPD (chronic obstructive pulmonary disease) (HCC)   Elevated troponin   Coronary artery disease   History of CVA (cerebrovascular accident)   Gross hematuria  SIRS with unspecified source: Patient presenting on arrival with fever, tachycardia, tachypnea, and leukocytosis.  No obvious infectious source found at this time. CT chest/abdomen/pelvis without acute abnormalities - mild perinephric stranding (see report). Urinalysis with rare bacteria microscopy. SARS-CoV-2 and influenza Justin Griffith/B PCR's are negative.  - source continues to be unclear, though he seems to have improved on abx.  Possible bacterial infection vs viral?   - continue ceftriaxone for now, will d/c flagyl (plan for 7 days abx) --MRSA screen neg, vanc d/c'ed --procal 0.87 on 11/3, trend -- LP with 3 WBC's, RBC 26, glucose 88, total protein 67 - not c/w bacterial meningitis, elevated protein could be related to diabetes -> follow HSV pcr, though low suspicion, will not start antiviral at this time, especially with his clinical improvement (discussed LP with neuro) - follow HSV PCR (pending) -- follow CSF cx NG -- urine cx NG, blood cx NGTD --  repeat UA and cx, complained of dysuria/frequency today  Somnolence  Acute Metabolic Encephalopathy --Pt could barely stay awake.  Not on sedating medications.  VBG without hypercarbia.  Ammonia 19.  Follow B12 (wnl), TSH (wnl).  -- improved today, unclear cause at this point, he notes some chronic sleepiness (months) - continue cpap nightly  -- of note, he notes episode of loss of consciousness with previous hospitalization in September - he was to follow up with neurology outpatient - he should not drive for 6 months - follow up with neurology outpatient  -- follow outpatient with neurology for his chronic sleepiness, though this seems improved at this point  elevated troponin 2/2 demand ischemia ACS ruled out Hx of CAD High-sensitivity troponin initially elevated 387 with downward trend on repeat 355. Patient does have history of CAD. He denies any recent chest pain.Suspect demand ischemia in setting of possible sepsis. - echo without RWMA, EF 60-65% PLAN: --cardiology consult - no recommendation for additional cardiac diagnostics - continue coreg, irbesartan, and crestor, continue aspirin  BPH with gross hematuria: --urology consulted.  Hematuria suspected 2/2 foley trauma --foley d/c'ed PLAN: --cont flomax --Monitor urine output -- follow with urology outpatient  Falls at home/generalized weakness: Reports multiple falls at home with no significant injury. -Request PT/OT eval -Continue fall precautions  COPD chronic respiratory failure with hypoxia on home 3L  Stable without acute exacerbation. --cont Pulmicort neb and DuoNeb --cont home suppl O2  Hypomagnesemia: --Monitor and replete PRN  History of CVA: Punctate acute 3 mm acute ischemic nonhemorrhagic cortical infarct involving the right occipital lobe noted on last admission  with MRI brain 12/14/2019. -Continue aspirin and statin  Type 2 diabetes: A1c 7.5 on 12/15/2019. --cont Lantus 25u  nightly --SSI  Hypertension: BP improved -Continue Coreg 25 mg twice daily -Continue olmesartan 40 mg daily (switchedto irbesartan per pharmacy formulary while in hospital) -Holding home Lasix for now -DC losartan from home meds -Hold clonidine, not clear if he has been taking this regularly  Hyperlipidemia: Continue rosuvastatin.  OSA: Continue CPAP nightly.  DVT prophylaxis: SCD Code Status:full  Family Communication: none at bedside -- 11/6 daughter Disposition:   Status is: Inpatient  Remains inpatient appropriate because:Inpatient level of care appropriate due to severity of illness   Dispo: The patient is from: Home              Anticipated d/c is to: pending              Anticipated d/c date is: > 3 days              Patient currently is not medically stable to d/c.   Consultants:   Cardiology  IR  Procedures:  Echo IMPRESSIONS    1. Left ventricular ejection fraction, by estimation, is 60 to 65%. The  left ventricle has normal function. The left ventricle has no regional  wall motion abnormalities. Left ventricular diastolic function could not  be evaluated.  2. Right ventricular systolic function is normal. The right ventricular  size is normal.  3. The mitral valve is normal in structure. No evidence of mitral valve  regurgitation.   11/5 IMPRESSION: Successful fluoroscopically directed lumbar puncture.  Antimicrobials:  Anti-infectives (From admission, onward)   Start     Dose/Rate Route Frequency Ordered Stop   01/26/20 0800  metroNIDAZOLE (FLAGYL) tablet 500 mg  Status:  Discontinued        500 mg Oral Every 8 hours 01/25/20 1506 01/25/20 1647   01/25/20 2200  metroNIDAZOLE (FLAGYL) tablet 500 mg  Status:  Discontinued        500 mg Oral Every 8 hours 01/25/20 1647 01/26/20 1826   01/25/20 2200  cefTRIAXone (ROCEPHIN) 2 g in sodium chloride 0.9 % 100 mL IVPB        2 g 200 mL/hr over 30 Minutes Intravenous Every 24 hours 01/25/20  1647     01/24/20 0600  vancomycin (VANCOCIN) IVPB 1000 mg/200 mL premix  Status:  Discontinued        1,000 mg 200 mL/hr over 60 Minutes Intravenous Every 12 hours 01/23/20 2010 01/24/20 1917   01/23/20 2200  ceFEPIme (MAXIPIME) 2 g in sodium chloride 0.9 % 100 mL IVPB  Status:  Discontinued        2 g 200 mL/hr over 30 Minutes Intravenous Every 8 hours 01/23/20 2010 01/25/20 1647   01/23/20 2000  metroNIDAZOLE (FLAGYL) IVPB 500 mg  Status:  Discontinued        500 mg 100 mL/hr over 60 Minutes Intravenous Every 8 hours 01/23/20 1941 01/25/20 1647   01/23/20 1300  vancomycin (VANCOCIN) IVPB 1000 mg/200 mL premix       "Followed by" Linked Group Details   1,000 mg 200 mL/hr over 60 Minutes Intravenous  Once 01/23/20 1250 01/23/20 1459   01/23/20 1300  vancomycin (VANCOREADY) IVPB 1500 mg/300 mL       "Followed by" Linked Group Details   1,500 mg 150 mL/hr over 120 Minutes Intravenous  Once 01/23/20 1250 01/23/20 1804   01/23/20 1230  vancomycin (VANCOCIN) IVPB 1000 mg/200 mL premix  Status:  Discontinued        1,000 mg 200 mL/hr over 60 Minutes Intravenous  Once 01/23/20 1227 01/23/20 1248   01/23/20 1230  ceFEPIme (MAXIPIME) 2 g in sodium chloride 0.9 % 100 mL IVPB        2 g 200 mL/hr over 30 Minutes Intravenous  Once 01/23/20 1227 01/23/20 1355      Subjective: No complaints, worried about falling today  Objective: Vitals:   01/28/20 0732 01/28/20 0800 01/28/20 0832 01/28/20 1304  BP:  (!) 181/86  (!) 158/78  Pulse:  (!) 58  61  Resp:    20  Temp:  97.9 F (36.6 C)  97.9 F (36.6 C)  TempSrc:  Oral  Oral  SpO2: 95% 100% 100% 99%  Weight:      Height:       No intake or output data in the 24 hours ending 01/28/20 1448 Filed Weights   01/26/20 0406 01/27/20 0130 01/28/20 0500  Weight: (!) 136.7 kg (!) 137.9 kg 133.7 kg    Examination:  General: No acute distress. Cardiovascular: Heart sounds show Taneah Masri regular rate, and rhythm Lungs: Clear to auscultation  bilaterally  Abdomen: Soft, nontender, nondistended  Neurological: Alert and oriented 3. Moves all extremities 4 . Cranial nerves II through XII grossly intact. Skin: Warm and dry. No rashes or lesions. Extremities: No clubbing or cyanosis. No edema.    Data Reviewed: I have personally reviewed following labs and imaging studies  CBC: Recent Labs  Lab 01/23/20 1617 01/23/20 1617 01/24/20 0431 01/25/20 0435 01/26/20 0409 01/27/20 0434 01/28/20 0643  WBC 13.7*   < > 15.3* 8.5 6.2 6.6 6.6  NEUTROABS 11.7*  --   --  6.8  --  4.0 4.0  HGB 15.4   < > 13.7 13.0 12.6* 13.1 12.6*  HCT 45.3   < > 39.8 37.4* 37.5* 38.4* 36.6*  MCV 91.0   < > 90.9 90.3 91.5 89.7 90.6  PLT 219   < > 190 169 161 197 197   < > = values in this interval not displayed.    Basic Metabolic Panel: Recent Labs  Lab 01/24/20 0431 01/25/20 0435 01/26/20 0409 01/27/20 0434 01/28/20 0643  NA 133* 132* 129* 132* 136  K 3.7 3.7 4.0 4.4 3.8  CL 98 98 95* 96* 101  CO2 28 27 25 29 29   GLUCOSE 180* 146* 142* 189* 135*  BUN 18 24* 30* 23 14  CREATININE 1.12 1.17 1.28* 1.12 0.95  CALCIUM 9.4 9.0 9.4 9.8 9.4  MG 1.9 1.9 2.1 2.3 2.2  PHOS  --  2.2*  --  2.0* 2.9    GFR: Estimated Creatinine Clearance: 86.6 mL/min (by C-G formula based on SCr of 0.95 mg/dL).  Liver Function Tests: Recent Labs  Lab 01/23/20 1617 01/25/20 0435 01/26/20 0409 01/27/20 0434 01/28/20 0643  AST 31 21 21 23 28   ALT 17 15 15 17 20   ALKPHOS 52 52 53 55 50  BILITOT 1.7* 1.1 0.6 0.5 0.5  PROT 7.1 6.0* 5.8* 6.2* 6.0*  ALBUMIN 3.4* 2.6* 2.4* 2.7* 2.7*    CBG: Recent Labs  Lab 01/27/20 1150 01/27/20 1620 01/27/20 2144 01/28/20 0750 01/28/20 1228  GLUCAP 202* 152* 185* 141* 174*     Recent Results (from the past 240 hour(s))  Urine culture     Status: None   Collection Time: 01/23/20 12:26 PM   Specimen: Urine, Random  Result Value Ref Range Status   Specimen Description   Final  URINE, RANDOM Performed at  [facility], [address] [place], [place] [address]    Special Requests   Final    NONE Performed at [facility], [address] [place], [place] [address]    Culture   Final    NO GROWTH Performed at [facility], [place] [address] [place], [place] [address]    Report Status 01/24/2020 FINAL  Final  Respiratory Panel by RT PCR (Flu Offie Waide&B, Covid) - Nasopharyngeal Swab     Status: None   Collection Time: 01/23/20  1:02 PM   Specimen: Nasopharyngeal Swab  Result Value Ref Range Status   SARS Coronavirus 2 by RT PCR NEGATIVE NEGATIVE Final    Comment: (NOTE) SARS-CoV-2 target nucleic acids are NOT DETECTED.  The SARS-CoV-2 RNA is generally detectable in upper respiratoy specimens during the acute phase of infection. The lowest concentration of SARS-CoV-2 viral copies this assay can detect is 131 copies/mL. Deneene Tarver negative result does not preclude SARS-Cov-2 infection and should not be used as the sole basis for treatment or other patient management decisions. Kyle Luppino negative result may occur with  improper specimen collection/handling, submission of specimen other than nasopharyngeal swab, presence of viral mutation(s) within the areas targeted by this assay, and inadequate number of viral copies (<131 copies/mL). Keidy Thurgood negative result must be combined with clinical observations, patient history, and epidemiological information. The expected result is Negative.  Fact Sheet for Patients:  PinkCheek.be  Fact Sheet for Healthcare Providers:  GravelBags.it  This test is no t yet approved or cleared by the [place] FDA and  has been authorized for detection and/or diagnosis of SARS-CoV-2 by FDA under an Emergency Use Authorization (EUA). This EUA will remain  in effect (meaning this test can be used) for the duration of the COVID-19 declaration under Section 564(b)(1) of the Act, 21 U.S.C. section  360bbb-3(b)(1), unless the authorization is terminated or revoked sooner.     Influenza Dariush Mcnellis by PCR NEGATIVE NEGATIVE Final   Influenza B by PCR NEGATIVE NEGATIVE Final    Comment: (NOTE) The Xpert Xpress SARS-CoV-2/FLU/RSV assay is intended as an aid in  the diagnosis of influenza from Nasopharyngeal swab specimens and  should not be used as Jinx Gilden sole basis for treatment. Nasal washings and  aspirates are unacceptable for Xpert Xpress SARS-CoV-2/FLU/RSV  testing.  Fact Sheet for Patients: PinkCheek.be  Fact Sheet for Healthcare Providers: GravelBags.it  This test is not yet approved or cleared by the [place] FDA and  has been authorized for detection and/or diagnosis of SARS-CoV-2 by  FDA under an Emergency Use Authorization (EUA). This EUA will remain  in effect (meaning this test can be used) for the duration of the  Covid-19 declaration under Section 564(b)(1) of the Act, 21  U.S.C. section 360bbb-3(b)(1), unless the authorization is  terminated or revoked. Performed at [facility], [address] [place], [place] [address]   Blood Culture (routine x 2)     Status: None (Preliminary result)   Collection Time: 01/23/20  4:16 PM   Specimen: BLOOD  Result Value Ref Range Status   Specimen Description BLOOD BLOOD LEFT HAND  Final   Special Requests   Final    BOTTLES DRAWN AEROBIC ONLY Blood Culture results may not be optimal due to an inadequate volume of blood received in culture bottles   Culture   Final    NO GROWTH 4 DAYS Performed at [facility], [address] [place], [place] [address]  Report Status PENDING  Incomplete  Blood Culture (routine x 2)     Status: None (Preliminary result)   Collection Time: 01/23/20  4:16 PM   Specimen: BLOOD  Result Value Ref Range Status   Specimen Description BLOOD BLOOD RIGHT HAND  Final   Special Requests   Final    BOTTLES DRAWN AEROBIC AND  ANAEROBIC Blood Culture adequate volume   Culture   Final    NO GROWTH 4 DAYS Performed at [facility], [address], [place], [place] [address]    Report Status PENDING  Incomplete  MRSA PCR Screening     Status: None   Collection Time: 01/24/20  5:06 PM   Specimen: Nasal Mucosa; Nasopharyngeal  Result Value Ref Range Status   MRSA by PCR NEGATIVE NEGATIVE Final    Comment:        The GeneXpert MRSA Assay (FDA approved for NASAL specimens only), is one component of Jerren Flinchbaugh comprehensive MRSA colonization surveillance program. It is not intended to diagnose MRSA infection nor to guide or monitor treatment for MRSA infections. Performed at [facility], [address] [place], [place] [address]   Respiratory Panel by PCR     Status: None   Collection Time: 01/25/20 12:10 PM   Specimen: Respiratory  Result Value Ref Range Status   Adenovirus NOT DETECTED NOT DETECTED Final   Coronavirus 229E NOT DETECTED NOT DETECTED Final    Comment: (NOTE) The Coronavirus on the Respiratory Panel, DOES NOT test for the novel  Coronavirus (2019 nCoV)    Coronavirus HKU1 NOT DETECTED NOT DETECTED Final   Coronavirus NL63 NOT DETECTED NOT DETECTED Final   Coronavirus OC43 NOT DETECTED NOT DETECTED Final   Metapneumovirus NOT DETECTED NOT DETECTED Final   Rhinovirus / Enterovirus NOT DETECTED NOT DETECTED Final   Influenza Kinnley Paulson NOT DETECTED NOT DETECTED Final   Influenza B NOT DETECTED NOT DETECTED Final   Parainfluenza Virus 1 NOT DETECTED NOT DETECTED Final   Parainfluenza Virus 2 NOT DETECTED NOT DETECTED Final   Parainfluenza Virus 3 NOT DETECTED NOT DETECTED Final   Parainfluenza Virus 4 NOT DETECTED NOT DETECTED Final   Respiratory Syncytial Virus NOT DETECTED NOT DETECTED Final   Bordetella pertussis NOT DETECTED NOT DETECTED Final   Chlamydophila pneumoniae NOT DETECTED NOT DETECTED Final   Mycoplasma pneumoniae NOT DETECTED NOT DETECTED Final    Comment: Performed  at [facility], [place] [place], [place] [address]  CSF culture     Status: None (Preliminary result)   Collection Time: 01/26/20  8:43 AM   Specimen: PATH Cytology CSF; Cerebrospinal Fluid  Result Value Ref Range Status   Specimen Description   Final    CSF Performed at [name], [address], [place], [place] [address]    Special Requests   Final    CSF Performed at [facility], [address] [place], [place] [address]    Gram Stain   Final    NO ORGANISMS SEEN NO WBC SEEN RBC'S SEEN Performed at [name], [address], [place], [place] [address]    Culture   Final    NO GROWTH 2 DAYS Performed at [facility], [place] [address], [place],  [address]    Report Status PENDING  Incomplete         Radiology Studies: No results found.      Scheduled Meds: . aspirin  324 mg Oral Daily  . budesonide (  PULMICORT) nebulizer solution  0.25 mg Nebulization BID  . carvedilol  25 mg Oral BID WC  . Chlorhexidine Gluconate Cloth  6 each Topical Daily  . insulin aspart  0-9 Units Subcutaneous TID WC  . insulin glargine  25 Units Subcutaneous QHS  . ipratropium-albuterol  3 mL Nebulization TID  . irbesartan  300 mg Oral Daily  . mouth rinse  15 mL Mouth Rinse BID  . rosuvastatin  40 mg Oral QHS  . sodium chloride flush  3 mL Intravenous Q12H  . tamsulosin  0.4 mg Oral Daily   Continuous Infusions: . cefTRIAXone (ROCEPHIN)  IV 2 g (01/27/20 2133)     LOS: 5 days    Time spent: over 30 min    Fayrene Helper, MD [facility]   To contact the attending provider between 7A-7P or the covering provider during after hours 7P-7A, please log into the web site [email or web address] and access using universal [place] password for that web site. If you do not have the password, please call the hospital operator.  01/28/2020, 2:48 PM

## 2020-01-28 NOTE — Plan of Care (Signed)
  Problem: Clinical Measurements: Goal: Ability to maintain clinical measurements within normal limits will improve Outcome: Progressing   Problem: Activity: Goal: Risk for activity intolerance will decrease Outcome: Progressing   

## 2020-01-28 NOTE — Progress Notes (Signed)
Patient has refused cpap tonight.

## 2020-01-29 ENCOUNTER — Ambulatory Visit: Payer: Medicare Other

## 2020-01-29 DIAGNOSIS — A419 Sepsis, unspecified organism: Secondary | ICD-10-CM | POA: Diagnosis not present

## 2020-01-29 LAB — COMPREHENSIVE METABOLIC PANEL
ALT: 26 U/L (ref 0–44)
AST: 35 U/L (ref 15–41)
Albumin: 3 g/dL — ABNORMAL LOW (ref 3.5–5.0)
Alkaline Phosphatase: 54 U/L (ref 38–126)
Anion gap: 8 (ref 5–15)
BUN: 14 mg/dL (ref 8–23)
CO2: 29 mmol/L (ref 22–32)
Calcium: 10.1 mg/dL (ref 8.9–10.3)
Chloride: 99 mmol/L (ref 98–111)
Creatinine, Ser: 1.06 mg/dL (ref 0.61–1.24)
GFR, Estimated: >60 mL/min (ref >60)
Glucose, Bld: 177 mg/dL — ABNORMAL HIGH (ref 70–99)
Potassium: 3.9 mmol/L (ref 3.5–5.1)
Sodium: 136 mmol/L (ref 135–145)
Total Bilirubin: 0.5 mg/dL (ref 0.3–1.2)
Total Protein: 7.1 g/dL (ref 6.5–8.1)

## 2020-01-29 LAB — GLUCOSE, CAPILLARY
Glucose-Capillary: 150 mg/dL — ABNORMAL HIGH (ref 70–99)
Glucose-Capillary: 171 mg/dL — ABNORMAL HIGH (ref 70–99)

## 2020-01-29 LAB — CBC WITH DIFFERENTIAL/PLATELET
Abs Immature Granulocytes: 0.14 10*3/uL — ABNORMAL HIGH (ref 0.00–0.07)
Basophils Absolute: 0.1 10*3/uL (ref 0.0–0.1)
Basophils Relative: 1 %
Eosinophils Absolute: 0.2 10*3/uL (ref 0.0–0.5)
Eosinophils Relative: 2 %
HCT: 41.5 % (ref 39.0–52.0)
Hemoglobin: 14.2 g/dL (ref 13.0–17.0)
Immature Granulocytes: 2 %
Lymphocytes Relative: 21 %
Lymphs Abs: 1.7 10*3/uL (ref 0.7–4.0)
MCH: 30.9 pg (ref 26.0–34.0)
MCHC: 34.2 g/dL (ref 30.0–36.0)
MCV: 90.4 fL (ref 80.0–100.0)
Monocytes Absolute: 0.9 10*3/uL (ref 0.1–1.0)
Monocytes Relative: 10 %
Neutro Abs: 5.5 10*3/uL (ref 1.7–7.7)
Neutrophils Relative %: 64 %
Platelets: 232 10*3/uL (ref 150–400)
RBC: 4.59 MIL/uL (ref 4.22–5.81)
RDW: 12.6 % (ref 11.5–15.5)
WBC: 8.5 10*3/uL (ref 4.0–10.5)
nRBC: 0 % (ref 0.0–0.2)

## 2020-01-29 LAB — CSF CULTURE W GRAM STAIN
Culture: NO GROWTH
Gram Stain: NONE SEEN

## 2020-01-29 LAB — BLOOD GAS, VENOUS
Acid-Base Excess: 4.5 mmol/L — ABNORMAL HIGH (ref 0.0–2.0)
Bicarbonate: 32 mmol/L — ABNORMAL HIGH (ref 20.0–28.0)
O2 Saturation: 38.9 %
Patient temperature: 37
pCO2, Ven: 58 mmHg (ref 44.0–60.0)
pH, Ven: 7.35 (ref 7.250–7.430)

## 2020-01-29 LAB — URINE CULTURE: Culture: NO GROWTH

## 2020-01-29 LAB — MAGNESIUM: Magnesium: 2.2 mg/dL (ref 1.7–2.4)

## 2020-01-29 LAB — PHOSPHORUS: Phosphorus: 2.8 mg/dL (ref 2.5–4.6)

## 2020-01-29 MED ORDER — SODIUM CHLORIDE 0.9 % IV SOLN
2.0000 g | INTRAVENOUS | Status: DC
  Filled 2020-01-29: qty 20

## 2020-01-29 MED ORDER — INSULIN GLARGINE 100 UNIT/ML ~~LOC~~ SOLN
25.0000 [IU] | Freq: Every day | SUBCUTANEOUS | 11 refills | Status: DC
Start: 2020-01-29 — End: 2020-07-18

## 2020-01-29 NOTE — Care Management Important Message (Signed)
Important Message  Patient Details  Name: Justin Griffith MRN: [id number] Date of Birth: 1940/09/12   Medicare Important Message Given:  Yes     Juliann Pulse A Ghislaine Harcum 01/29/2020, 11:02 AM

## 2020-01-29 NOTE — Progress Notes (Signed)
Occupational Therapy Treatment Patient Details Name: Justin Griffith MRN: [id number] DOB: Jun 23, 1940 Today's Date: 01/29/2020    History of present illness Justin Griffith is a 79 y.o. male with medical history significant for COPD, chronic respiratory failure with hypoxia on 3 L of home O2 via [place], CAD, PVD with chronic venous stasis dermatitis, history of CVA, T2DM, HTN, HLD, BPH, essential tremor, and OSA on CPAP who presents to the ED for evaluation of shortness of breath and fevers.   OT comments  Pt seen for OT treatment this date to f/u Re: safety with ADLs/ADL mobility. OT faciltiates pt participation in sup to sit with INDEP, pt demos normal static sitting balance and tolerates well. CTS with no AD with no assist, just SUPV for safety cues. Pt demos G static standing balance with no use of AD, F dynamic standing balnace/balance with fxl mobility-which is improved through use of RW, but he is ultimately able to compelete fxl mobility w/o AD safely (note comment on his fxl mobility pattern observations, but pt reports this is his baseline). OT engages pt in standing g/h tasks sink-side for which he completes with SBA/SUPV and no verbal cues. Pt demos good safety, stability, and tolerance for ~6-7 mins of standing tasks. Will continue to follow, anticipate pt is safe to d/c home with HHOT.   Follow Up Recommendations  Home health OT    Equipment Recommendations       Recommendations for Other Services      Precautions / Restrictions Precautions Precautions: Fall Restrictions Weight Bearing Restrictions: No       Mobility Bed Mobility Overal bed mobility: Independent Bed Mobility: Supine to Sit              Transfers Overall transfer level: Needs assistance Equipment used: Rolling walker (2 wheeled) Transfers: Sit to/from [facility] Sit to Stand: Supervision Stand pivot transfers: Supervision       General transfer comment: trialed with and w/o RW,  ultimately able to complete with no AD with good strength/steadiness.    Balance Overall balance assessment: Independent Sitting-balance support: Feet supported Sitting balance-Leahy Scale: Normal       Standing balance-Leahy Scale: Good Standing balance comment: able to tolerate static stand w/o UE support, able to perform fxl mobility w/o RW. Does demo better pace and quality of steps with use of RW, but reports the wide BOS and excessive lateral weight shift pattern is normal for him at baseline.                           ADL either performed or assessed with clinical judgement   ADL                                         General ADL Comments: Pt able to stand sink-side to complete 2 g/h tasks (oral care and face washing) with SBA with RW, but ultimately uses counter for intermittent balance support rather than RW. Pt demos improved standing tolerance for ~6 mins while performing fxl mobility (SBA with and w/o RW) and completing self care.     Vision Baseline Vision/History: Wears glasses Wears Glasses: Reading only Patient Visual Report: No change from baseline     Perception     Praxis      Cognition Arousal/Alertness: Awake/alert Behavior During Therapy: WFL for tasks assessed/performed Overall  Cognitive Status: Within Functional Limits for tasks assessed                                 General Comments: pt HOH (L ear better), only has one of his hearing aides        Exercises Other Exercises Other Exercises: OT faciltiates pt participation in sup to sit with INDEP, pt demos normal static sitting balance and tolerates well. CTS with no AD with no assist, just SUPV for safety cues. Pt demos G static standing balance with no use of AD, F dynamic standing balnace/balance with fxl mobility-which is improved through use of RW, but he is ultimately able to compelete fxl mobility w/o AD safely (note comment on his fxl mobility  pattern observations, but pt reports this is his baseline). OT engages pt in standing g/h tasks sink-side for which he completes with SBA/SUPV and no verbal cues. Pt demos good safety, stability, and tolerance for ~6-7 mins of standing tasks. Will continue to follow, anticipate pt is safe to d/c home with HHOT.   Shoulder Instructions       General Comments      Pertinent Vitals/ Pain       Pain Assessment: No/denies pain  Home Living                                          Prior Functioning/Environment              Frequency  Min 1X/week        Progress Toward Goals  OT Goals(current goals can now be found in the care plan section)  Progress towards OT goals: Progressing toward goals  Acute Rehab OT Goals Patient Stated Goal: Want to go home- get [facility] like last time OT Goal Formulation: With patient Time For Goal Achievement: 02/10/20 Potential to Achieve Goals: Good  Plan Discharge plan remains appropriate;Frequency remains appropriate    Co-evaluation                 AM-PAC OT "6 Clicks" Daily Activity     Outcome Measure   Help from another person eating meals?: None Help from another person taking care of personal grooming?: None Help from another person toileting, which includes using toliet, bedpan, or urinal?: A Little Help from another person bathing (including washing, rinsing, drying)?: A Little Help from another person to put on and taking off regular upper body clothing?: None Help from another person to put on and taking off regular lower body clothing?: A Little 6 Click Score: 21    End of Session Equipment Utilized During Treatment: Gait belt;Rolling walker  OT Visit Diagnosis: Unsteadiness on feet (R26.81);Muscle weakness (generalized) (M62.81)   Activity Tolerance Patient tolerated treatment well   Patient Left in chair;with call bell/phone within reach   Nurse Communication Mobility status        Time:  8756-4332 OT Time Calculation (min): 15 min  Charges: OT General Charges $OT Visit: 1 Visit OT Treatments $Self Care/Home Management : 8-22 mins  Gerrianne Scale, [place], OTR/L ascom [phone] 01/29/20, 10:20 AM

## 2020-01-29 NOTE — Discharge Summary (Addendum)
Physician Discharge Summary  Justin Griffith HDQ:[id number] DOB: February 14, 1941 DOA: 01/23/2020  PCP: Guadalupe Maple, MD  Admit date: 01/23/2020 Discharge date: 02/06/2020  Time spent: 40 minutes  Recommendations for Outpatient Follow-up:  1. Follow outpatient CBC/CMP 2. Follow pending HSV PCR outpatient 3. Please follow up with neurology outpatient for excessive sleepiness 4. Concern for LOC? At previous hospitalization, he should not drive for 6 months since that event, follow with neurology outpatient  5. BG's reasonable here on 25 units lantus daily.  Will reduce lantus to 25 units daily.  Needs close follow up and adjustment outpatient 6. Clonidine d/c'd on admission, BP's elevated, resume on d/c and follow BP on d/c and adjust as needed 7. Consider cardiology follow up for elevated troponin 8. Follow pending urine cx 9. Follow with urology outpatient for hematuria, BPH, may need cystoscopy  Discharge Diagnoses:  Active Problems:   Hypertension associated with diabetes ([place])   Type 2 diabetes mellitus with proteinuria (HCC)   Hyperlipidemia associated with type 2 diabetes mellitus (HCC)   Sleep apnea   BPH (benign prostatic hyperplasia)   COPD (chronic obstructive pulmonary disease) (HCC)   Elevated troponin   Coronary artery disease   History of CVA (cerebrovascular accident)   Microscopic hematuria   Discharge Condition: stable  Diet recommendation: carb modified  Filed Weights   01/27/20 0130 01/28/20 0500 01/29/20 0743  Weight: (!) 137.9 kg 133.7 kg 133 kg    History of present illness:  Justin Griffith 79 y.o.malewith medical history significant forCOPD, chronic respiratory failure with hypoxia on 3 L of home O2 via [place], CAD, PVD with chronic venous stasisdermatitis, history of CVA, T2DM, HTN, HLD, BPH, essential tremor, and OSA on CPAP whois admitted with concern for sepsis without Justin Griffith clear source.  He's improved with antibiotics and conservative care.  HSV  PCR from LP is pending on discharge, though he's improved to his baseline without antivirals, so will plan for outpatient follow up.  He'll need outpatient follow up with his PCP and neurology as an outpatient as well as cardiology.    Hospital Course:  SIRSwith unspecified source: Patient presenting on arrival with fever, tachycardia, tachypnea, and leukocytosis.No obvious infectious source found at this time. CT chest/abdomen/pelvis without acute abnormalities - mild perinephric stranding (see report). Urinalysis with rare bacteria microscopy. SARS-CoV-2 and influenza Justin Griffith/B PCR's are negative.  - source continues to be unclear, though he seems to have improved on abx.  Possible bacterial infection vs viral? Sepsis ruled out. - he's completed 7 days abx --MRSA screen neg, vanc d/c'ed --procal 0.87 on 11/3 -- LP with 3 WBC's, RBC 26, glucose 88, total protein 67 - not c/w bacterial meningitis, elevated protein could be related to diabetes -> follow HSV pcr, though low suspicion, will not start antiviral at this time, especially with his clinical improvement (discussed LP with neuro) - follow HSV PCR (pending at time of discharge) -- follow CSF cx NG -- urine cx NG, blood cx NGTD -- repeat UA and cx, complained of dysuria/frequency today - not c/w UTI, follow outpatient, follow pending urine cx  Somnolence  Acute Metabolic Encephalopathy --Pt could barely stay awake. Not on sedating medications. VBG without hypercarbia. Ammonia 19. Follow B12 (wnl), TSH (wnl).  -- improved today, unclear cause at this point, he notes some chronic sleepiness (months) - continue cpap nightly  -- of note, he notes episode of loss of consciousness with previous hospitalization in September - he was to follow up with neurology  outpatient - he should not drive for 6 months - follow up with neurology outpatient  -- follow outpatient with neurology for his chronic sleepiness, though this seems improved at this  point  elevated troponin2/2 demand ischemia ACS ruled out Hx of CAD High-sensitivity troponin initially elevated 387 with downward trend on repeat 355. Patient does have history of CAD. He denies any recent chest pain.Suspect demand ischemia in setting ofpossiblesepsis. - echo without RWMA, EF 60-65% PLAN: --cardiology consult - no recommendation for additional cardiac diagnostics - continue coreg, irbesartan, and crestor, continue aspirin  BPH withgrosshematuria: --urology consulted.Hematuria suspected 2/2 foley trauma --foley d/c'ed PLAN: --cont flomax --Monitor urine output -- follow with urology outpatient  Falls at home/generalized weakness: Reports multiple falls at home with no significant injury. -Request PT/OT eval -Continue fall precautions  COPD chronic respiratory failure with hypoxiaon home 3L Stable without acute exacerbation.--cont Pulmicort neb and DuoNeb --cont home suppl O2  Hypomagnesemia: --Monitor and replete PRN  History of CVA: Punctate acute 3 mm acute ischemic nonhemorrhagic cortical infarct involving the right occipital lobe noted on last admission with MRI brain 12/14/2019. -Continue aspirin and statin  Type 2 diabetes: A1c 7.5 on 12/15/2019. --cont Lantus 25u nightly --SSI  Hypertension: BP improved -Continue Coreg 25 mg twice daily -Continue olmesartan 40 mg daily (switchedto irbesartan per pharmacy formulary while in hospital) -Holding home Lasix for now -DC losartan from home meds -resume clonidine given elevated BP in house - follow bp outpatient   Hyperlipidemia: Continue rosuvastatin.  OSA: Continue CPAP nightly.  Procedures: 11/5 LP  Echo IMPRESSIONS    1. Left ventricular ejection fraction, by estimation, is 60 to 65%. The  left ventricle has normal function. The left ventricle has no regional  wall motion abnormalities. Left ventricular diastolic function could not  be evaluated.  2. Right  ventricular systolic function is normal. The right ventricular  size is normal.  3. The mitral valve is normal in structure. No evidence of mitral valve  regurgitation.   Consultations:  IR  Cardiology  urology  Discharge Exam: Vitals:   01/29/20 0743 01/29/20 1146  BP: (!) 195/87 (!) 176/84  Pulse: 64 60  Resp: 16 16  Temp: 98.2 F (36.8 C) (!) 97.5 F (36.4 C)  SpO2: 93% 94%   No new complaints Discussed d/c plan Discussed with daughter as well  General: No acute distress. Cardiovascular: Heart sounds show Justin Griffith regular rate, and rhythm Lungs: Clear to auscultation bilaterally  Abdomen: Soft, nontender, nondistended  Neurological: Alert and oriented 3. Moves all extremities 4 . Cranial nerves II through XII grossly intact. Skin: Warm and dry. No rashes or lesions. Extremities: No clubbing or cyanosis. No edema.  Discharge Instructions   Discharge Instructions    Ambulatory referral to Neurology   Complete by: As directed    Call MD for:  difficulty breathing, headache or visual disturbances   Complete by: As directed    Call MD for:  extreme fatigue   Complete by: As directed    Call MD for:  hives   Complete by: As directed    Call MD for:  persistant dizziness or light-headedness   Complete by: As directed    Call MD for:  persistant nausea and vomiting   Complete by: As directed    Call MD for:  redness, tenderness, or signs of infection (pain, swelling, redness, odor or green/yellow discharge around incision site)   Complete by: As directed    Call MD for:  severe uncontrolled pain  Complete by: As directed    Call MD for:  temperature >100.4   Complete by: As directed    Diet - low sodium heart healthy   Complete by: As directed    Diet Carb Modified   Complete by: As directed    Discharge instructions   Complete by: As directed    You were seen for Justin Griffith suspected infection and confusion.   You've improved with antibiotics.  Your cultures and  workup have so far been negative.  You have Justin Griffith pending HSV PCR to look for Basim Bartnik viral infection in your spinal fluid, but you've continued to improve without any antiviral therapy, so I think it's ok for you to discharge home.  I think we should stop your gabapentin for now, this can sometimes cause sedation or confusion.  I'll also recommend that your insulin be resumed at Frandy Basnett lower dose outpatient (you were only on 25 units of lantus here and your blood sugars were ok.  Please follow your blood sugars closely outpatient.  You had an episode of loss of consciousness associated with Justin Griffith hospitalization in September.  You should not drive for at least 6 months from that event.  Please follow up with your neurologist as an outpatient to follow your excessive sleepiness and that hospitalization.  Resume your clonidine at discharge.  Follow your blood pressure outpatient.  Please follow up with your PCP about whether this should be continued.  Follow up with urology outpatient for your enlarged prostate and the blood in your urine.  Ask your PCP about cardiology follow up outpatient.  Return for new, recurrent, or worsening symptoms.  Please ask your PCP to request records from this hospitalization so they know what was done and what the next steps will be.   Increase activity slowly   Complete by: As directed      Allergies as of 01/29/2020      Reactions   Farxiga [dapagliflozin] Rash   Jardiance [empagliflozin] Rash      Medication List    STOP taking these medications   gabapentin 300 MG capsule Commonly known as: NEURONTIN   meloxicam 7.5 MG tablet Commonly known as: MOBIC   naproxen 500 MG tablet Commonly known as: NAPROSYN     TAKE these medications   acyclovir 400 MG tablet Commonly known as: ZOVIRAX Take 400 mg by mouth 2 (two) times daily.   aspirin 325 MG EC tablet Take 1 tablet (325 mg total) by mouth daily.   azelastine 0.1 % nasal spray Commonly known as:  ASTELIN Place 2 sprays into both nostrils 2 (two) times daily.   carvedilol 12.5 MG tablet Commonly known as: COREG Take 2 tablets (25 mg total) by mouth 2 (two) times daily with Gradie Ohm meal.   clobetasol cream 0.05 % Commonly known as: TEMOVATE Apply 1 application topically 2 (two) times daily. Apply to bilateral lower legs.   cloNIDine 0.2 MG tablet Commonly known as: CATAPRES Take 0.2 mg by mouth 2 (two) times daily.   donepezil 5 MG tablet Commonly known as: Aricept Take 1 tablet (5 mg total) by mouth at bedtime.   furosemide 20 MG tablet Commonly known as: LASIX Take 1 tablet (20 mg total) by mouth daily.   insulin glargine 100 UNIT/ML injection Commonly known as: Lantus Inject 0.25 mLs (25 Units total) into the skin daily. What changed: how much to take   ipratropium-albuterol 0.5-2.5 (3) MG/3ML Soln Commonly known as: DUONEB Take 3 mLs by nebulization every 6 (six)  hours.   metFORMIN 1000 MG tablet Commonly known as: GLUCOPHAGE Take 1,000 mg by mouth 2 (two) times daily with Justin Griffith meal.   mupirocin ointment 2 % Commonly known as: BACTROBAN Apply 1 application topically 2 (two) times daily. To open blisters bilateral legs.   olmesartan 40 MG tablet Commonly known as: BENICAR Take 1 tablet (40 mg total) by mouth daily.   primidone 50 MG tablet Commonly known as: MYSOLINE Take 1 tablet (50 mg total) by mouth 2 (two) times daily.   Qvar RediHaler 40 MCG/ACT inhaler Generic drug: beclomethasone 1 puff 2 (two) times daily.   rosuvastatin 40 MG tablet Commonly known as: CRESTOR Take 1 tablet (40 mg total) by mouth at bedtime. What changed: additional instructions   tamsulosin 0.4 MG Caps capsule Commonly known as: FLOMAX Take 0.4 mg by mouth daily. Patient restarted on his own due to trouble urinating   vitamin B-12 500 MCG tablet Commonly known as: CYANOCOBALAMIN Take 1,000 mcg by mouth daily. Two at night   Vitamin D (Cholecalciferol) 25 MCG (1000 UT)  Caps Take 50 mcg by mouth daily. 2 Justin Griffith night      Allergies  Allergen Reactions  . Farxiga [Dapagliflozin] Rash  . Jardiance [Empagliflozin] Rash    Follow-up Information    Anabel Bene, MD On 02/05/2020.   Specialty: Neurology Why: Call for follow up with your PCP;  @ 10:30 am Contact information: [place] [facility] [place] [address] [phone]        Marnee Guarneri T, NP On 02/02/2020.   Specialty: Nurse Practitioner Why: Call for follow up with your PCP;  @ 4:00 pm Contact information: [place] [address] [phone]        Hollice Espy, MD Follow up.   Specialty: Urology Why: Call for follow up for blood in your urine Contact information: [place]  [address] [phone]                The results of significant diagnostics from this hospitalization (including imaging, microbiology, ancillary and laboratory) are listed below for reference.    Significant Diagnostic Studies: CT Head Wo Contrast  Result Date: 01/23/2020 CLINICAL DATA:  Altered mental status EXAM: CT HEAD WITHOUT CONTRAST TECHNIQUE: Contiguous axial images were obtained from the base of the skull through the vertex without intravenous contrast. COMPARISON:  12/16/2019 FINDINGS: Brain: No evidence of acute infarction, hemorrhage, hydrocephalus, extra-axial collection or mass lesion/mass effect. Mild chronic white matter ischemic changes noted. Vascular: No hyperdense vessel or unexpected calcification. Skull: Normal. Negative for fracture or focal lesion. Sinuses/Orbits: No acute finding. Other: None. IMPRESSION: Mild chronic white matter ischemic changes stable from the prior exam. No acute abnormality noted. Electronically Signed   By: Inez Catalina M.D.   On: 01/23/2020 17:55   CT CHEST ABDOMEN PELVIS W CONTRAST  Result Date: 01/23/2020 CLINICAL DATA:  Shortness of breath and fever EXAM: CT  CHEST, ABDOMEN, AND PELVIS WITH CONTRAST TECHNIQUE: Multidetector CT imaging of the chest, abdomen and pelvis was performed following the standard protocol during bolus administration of intravenous contrast. CONTRAST:  179mL OMNIPAQUE IOHEXOL 300 MG/ML  SOLN COMPARISON:  Chest x-ray from earlier in the same day, CT from 08/05/2011. FINDINGS: CT CHEST FINDINGS Cardiovascular: Thoracic aorta and its branches demonstrate atherosclerotic calcification. No aneurysmal dilatation or dissection is noted. No large central pulmonary embolus is noted. Cardiac shadow is within normal limits. Coronary calcifications are seen. Mediastinum/Nodes: Thoracic inlet is  within normal limits. No sizable hilar or mediastinal adenopathy is noted. The esophagus as visualized is within normal limits. Lungs/Pleura: Mild emphysematous changes are seen. No focal confluent infiltrate is noted. Very mild basilar scarring is seen. 5 mm nodule is noted in the right lower lobe just above the hemidiaphragm stable in appearance from the prior exam. Small right middle lobe nodule is noted along the pleural margin anterior laterally also stable from the prior exam. No new parenchymal nodules are seen. Musculoskeletal: Degenerative changes of the thoracic spine are noted. No acute bony abnormality is seen. CT ABDOMEN PELVIS FINDINGS Hepatobiliary: Mild fatty infiltration of the liver is noted. An area of focal fatty sparing is noted along the falciform ligament. Gallbladder has been surgically. Pancreas: Pancreas is within normal limits. Spleen: Normal in size without focal abnormality. Adrenals/Urinary Tract: Adrenal glands are within normal limits bilaterally. Kidneys demonstrate Karmah Potocki normal enhancement pattern bilaterally. Mild perinephric stranding is noted similar to that seen on prior exam. Delayed images demonstrate normal excretion of contrast material. The bladder is well distended. Stomach/Bowel: Postsurgical changes are noted in the colon. No  obstructive or inflammatory changes are noted. The appendix is not well visualized although no inflammatory changes to suggest appendicitis are noted. The small bowel and stomach are within normal limits. Vascular/Lymphatic: Aortic atherosclerosis. No enlarged abdominal or pelvic lymph nodes. Reproductive: Prostate is prominent measuring 7.6 cm in transverse dimension. Other: No abdominal wall hernia or abnormality. No abdominopelvic ascites. Musculoskeletal: Mild degenerative changes of the lumbar spine are noted.  DG Chest Port 1 View  Result Date: 01/23/2020 CLINICAL DATA:  Worsening shortness of breath and fever, fell yesterday, sepsis, lethargy, history asthma, COPD, diabetes mellitus, hypertension EXAM: PORTABLE CHEST 1 VIEW COMPARISON:  Portable exam 1237 hours compared to 12/14/2019 FINDINGS: Normal heart size, mediastinal contours, and pulmonary vascularity. Minimal RIGHT basilar atelectasis and LEFT basilar scarring unchanged. Lungs otherwise clear. No acute infiltrate, pleural effusion or pneumothorax. Scattered endplate spur formation thoracic spine. IMPRESSION: LEFT basilar scarring and minimal RIGHT basilar atelectasis, unchanged. No acute abnormalities. Aortic Atherosclerosis (ICD10-I70.0). Electronically Signed   By: Lavonia Dana M.D.   On: 01/23/2020 12:46   ECHOCARDIOGRAM LIMITED  Result Date: 01/24/2020    ECHOCARDIOGRAM LIMITED REPORT   Patient Name:   CHASTON BRADBURN [facility] Date of Exam: 01/24/2020 Medical Rec #:  [id number]      Height:       71.0 in Accession #:    [id number]     Weight:       290.8 lb Date of Birth:  12/21/1940      BSA:          2.472 m Patient Age:    21 years       BP:           not listed in chart/not listed in                                              chart mmHg Patient Gender: M              HR:           Not listed in chart bpm. Exam Location:  [facility] Procedure: Limited Echo, Color Doppler and Cardiac Doppler Indications:     Elevated troponin  History:         Patient  has prior history of Echocardiogram  examinations, most                  recent 12/16/2019. COPD; Risk Factors:Sleep Apnea, Hypertension                  and Diabetes. History of tobacco use.  Sonographer:     Sherrie Sport RDCS (AE) Referring Phys:  [phone] Cyenna Rebello CALDWELL POWELL JR Diagnosing Phys: Kate Sable MD  Sonographer Comments: Technically difficult study due to poor echo windows, suboptimal parasternal window and no apical window. The best view was subcostal view. IMPRESSIONS  1. Left ventricular ejection fraction, by estimation, is 60 to 65%. The left ventricle has normal function. The left ventricle has no regional wall motion abnormalities. Left ventricular diastolic function could not be evaluated.  2. Right ventricular systolic function is normal. The right ventricular size is normal.  3. The mitral valve is normal in structure. No evidence of mitral valve regurgitation. FINDINGS  Left Ventricle: Left ventricular ejection fraction, by estimation, is 60 to 65%. The left ventricle has normal function. The left ventricle has no regional wall motion abnormalities. The left ventricular internal cavity size was normal in size. Left ventricular diastolic function could not be evaluated. Right Ventricle: The right ventricular size is normal. Right ventricular systolic function is normal. Pericardium: There is no evidence of pericardial effusion. Mitral Valve: The mitral valve is normal in structure. Tricuspid Valve: The tricuspid valve is normal in structure. Tricuspid valve regurgitation is not demonstrated. Pulmonic Valve: The pulmonic valve was not assessed. Aorta: The aortic root is normal in size and structure. Venous: The inferior vena cava was not well visualized. LEFT VENTRICLE PLAX 2D LVIDd:         4.25 cm LVIDs:         2.89 cm LV PW:         1.46 cm LV IVS:        1.44 cm LVOT diam:     2.00 cm LVOT Area:     3.14 cm  LEFT ATRIUM         Index LA diam:    3.00 cm 1.21 cm/m   AORTA Ao Root diam: 3.10  cm  SHUNTS Systemic Diam: 2.00 cm Kate Sable MD Electronically signed by Kate Sable MD Signature Date/Time: 01/24/2020/12:56:21 PM    Final    DG FLUORO GUIDED LOC OF Griffith/CATH TIP FOR SPINAL INJECT LT  Result Date: 01/26/2020 CLINICAL DATA:  Systemic inflammatory response syndrome. EXAM: DIAGNOSTIC LUMBAR PUNCTURE UNDER FLUOROSCOPIC GUIDANCE FLUOROSCOPY TIME:  Fluoroscopy Time:  0 minutes 24 seconds Radiation Exposure Index (if provided by the fluoroscopic device): 21.0 mGy Number of Acquired Spot Images: 1. PROCEDURE: Informed consent was obtained from the patient prior to the procedure, including potential complications of headache, allergy, and pain. With the patient prone, the lower back was prepped with Betadine. 1% Lidocaine was used for local anesthesia. Lumbar puncture was performed at the L4-L5 level using Justin Griffith with return of clear CSF with an opening pressure of 15 cm water. 10 ml of CSF were obtained for laboratory studies. The patient tolerated the procedure well and there were no apparent complications. IMPRESSION: Successful fluoroscopically directed lumbar puncture. Electronically Signed   By: Marcello Moores  Register   On: 01/26/2020 09:02    Microbiology: Recent Results (from the past 240 hour(s))  Urine Culture     Status: None   Collection Time: 01/28/20 12:45 PM   Specimen: Urine, Random  Result Value Ref Range Status  Specimen Description   Final    URINE, RANDOM Performed at [facility], [address], [place], [place] [address]    Special Requests   Final    NONE Performed at [facility], [address], [place], [place] [address]    Culture   Final    NO GROWTH Performed at [facility], [place] [address], [place], [place] [address]    Report Status 01/29/2020 FINAL  Final  Microscopic Examination     Status: Abnormal   Collection Time: 02/02/20 11:50 AM   Urine  Result Value Ref Range Status   WBC, UA 0-5 0 - 5  /hpf Final   RBC 3-10 (Bhargav Barbaro) 0 - 2 /hpf Final   Epithelial Cells (non renal) 0-10 0 - 10 /hpf Final   Bacteria, UA None seen None seen/Few Final     Labs: Basic Metabolic Panel: Recent Labs  Lab 02/02/20 1646  NA 136  K 3.8  CL 101  CO2 24  GLUCOSE 166*  BUN 6*  CREATININE 0.89  CALCIUM 10.4*   Liver Function Tests: Recent Labs  Lab 02/02/20 1646  AST 15  ALT 20  ALKPHOS 70  BILITOT 0.5  PROT 6.8  ALBUMIN 3.7   No results for input(s): LIPASE, AMYLASE in the last 168 hours. No results for input(s): AMMONIA in the last 168 hours. CBC: Recent Labs  Lab 02/02/20 1646  WBC 9.2  NEUTROABS 6.6  HGB 15.0  HCT 43.4  MCV 90  PLT 347   Cardiac Enzymes: No results for input(s): CKTOTAL, CKMB, CKMBINDEX, TROPONINI in the last 168 hours. BNP: BNP (last 3 results) Recent Labs    01/23/20 1617  BNP 76.3    ProBNP (last 3 results) No results for input(s): PROBNP in the last 8760 hours.  CBG: No results for input(s): GLUCAP in the last 168 hours.     Signed:  Fayrene Helper MD.  [facility] 02/06/2020, 3:58 PM

## 2020-01-29 NOTE — Progress Notes (Signed)
Physical Therapy Treatment Patient Details Name: Justin Griffith MRN: [id number] DOB: 1940-11-17 Today's Date: 01/29/2020    History of Present Illness Justin Griffith is a 79 y.o. male with medical history significant for COPD, chronic respiratory failure with hypoxia on 3 L of home O2 via [place], CAD, PVD with chronic venous stasis dermatitis, history of CVA, T2DM, HTN, HLD, BPH, essential tremor, and OSA on CPAP who presents to the ED for evaluation of shortness of breath and fevers.    PT Comments    Pt is a very Justin man, agreeable to tx. Pt received on room air & remained on room air throughout session. SpO2 = 87-93%, when oxygen saturation drops it quickly recovers with cuing for pursed lip breathing. Pt ambulates within room without AD with supervision, toilets without assist. Pt ambulates 2 laps around nurses station without AD & CGA. In room, pt performs high level balance retraining standing beside counter for UE support: tandem stance & SLS with up to max assist to intermittently correct LOB as pt with great difficulty with SLS. Pt performs 20 repetitions of seated hip adduction pillow squeezes. Will continue to follow acutely to address high level balance & gait with LRAD. (Based on pt's Berg Balance Test score from yesterday pt will require RW for balance & to prevent fall risk upon d/c home).    Follow Up Recommendations  Supervision for mobility/OOB;Home health PT     Equipment Recommendations  Rolling walker with 5" wheels;3in1 (PT)    Recommendations for Other Services       Precautions / Restrictions Precautions Precautions: Fall Precaution Comments: monitor O2 Restrictions Weight Bearing Restrictions: No    Mobility  Bed Mobility Overal bed mobility: Independent Bed Mobility: Supine to Sit              Transfers Overall transfer level: Needs assistance Equipment used: None Transfers: Sit to/from Stand Sit to Stand: Modified independent (Device/Increase  time)        General transfer comment: pt impulsively gets OOB without assist to go to bathroom despite PT educating him to wait  Ambulation/Gait Ambulation/Gait assistance: Min guard Gait Distance (Feet): 320 Feet Assistive device: None Gait Pattern/deviations: Step-through pattern;Wide base of support         Stairs             Wheelchair Mobility    Modified Rankin (Stroke Patients Only)       Balance Overall balance assessment: Independent Sitting-balance support: Feet supported Sitting balance-Leahy Scale: Normal     Standing balance support: Bilateral upper extremity supported Standing balance-Leahy Scale: Good                             Cognition Arousal/Alertness: Awake/alert Behavior During Therapy: WFL for tasks assessed/performed Overall Cognitive Status: Within Functional Limits for tasks assessed                                 General Comments: pt HOH (L ear better), only has one of his hearing aides      Exercises     General Comments        Pertinent Vitals/Pain Pain Assessment: No/denies pain    Home Living                      Prior Function  PT Goals (current goals can now be found in the care plan section) Acute Rehab PT Goals Patient Stated Goal: Want to go home- get [facility] again like last time PT Goal Formulation: With patient Time For Goal Achievement: 02/07/20 Potential to Achieve Goals: Good Progress towards PT goals: Progressing toward goals    Frequency    Min 2X/week      PT Plan Current plan remains appropriate    Co-evaluation              AM-PAC PT "6 Clicks" Mobility   Outcome Measure  Help needed turning from your back to your side while in a flat bed without using bedrails?: None Help needed moving from lying on your back to sitting on the side of a flat bed without using bedrails?: None Help needed moving to and from a bed to a chair (including  a wheelchair)?: None Help needed standing up from a chair using your arms (e.g., wheelchair or bedside chair)?: None Help needed to walk in hospital room?: A Little Help needed climbing 3-5 steps with a railing? : A Little 6 Click Score: 22    End of Session Equipment Utilized During Treatment: Gait belt Activity Tolerance: Patient tolerated treatment well Patient left: in chair;with call bell/phone within reach;with chair alarm set Nurse Communication: Mobility status (oxygen saturation) PT Visit Diagnosis: Unsteadiness on feet (R26.81);Other abnormalities of gait and mobility (R26.89);Repeated falls (R29.6);Muscle weakness (generalized) (M62.81);History of falling (Z91.81);Difficulty in walking, not elsewhere classified (R26.2)     Time: 6979-4801 PT Time Calculation (min) (ACUTE ONLY): 27 min  Charges:  $Therapeutic Activity: 23-37 mins                     Lavone Nian, PT, DPT 01/29/20, 11:39 AM    Waunita Schooner 01/29/2020, 11:36 AM

## 2020-01-30 LAB — HSV 1/2 PCR, CSF
HSV-1 DNA: NEGATIVE
HSV-2 DNA: NEGATIVE

## 2020-01-31 DIAGNOSIS — Z23 Encounter for immunization: Secondary | ICD-10-CM | POA: Diagnosis not present

## 2020-02-01 DIAGNOSIS — E785 Hyperlipidemia, unspecified: Secondary | ICD-10-CM | POA: Diagnosis not present

## 2020-02-01 DIAGNOSIS — E1159 Type 2 diabetes mellitus with other circulatory complications: Secondary | ICD-10-CM | POA: Diagnosis not present

## 2020-02-01 DIAGNOSIS — I152 Hypertension secondary to endocrine disorders: Secondary | ICD-10-CM | POA: Diagnosis not present

## 2020-02-01 DIAGNOSIS — J449 Chronic obstructive pulmonary disease, unspecified: Secondary | ICD-10-CM | POA: Diagnosis not present

## 2020-02-01 DIAGNOSIS — E1169 Type 2 diabetes mellitus with other specified complication: Secondary | ICD-10-CM | POA: Diagnosis not present

## 2020-02-01 DIAGNOSIS — G9341 Metabolic encephalopathy: Secondary | ICD-10-CM | POA: Diagnosis not present

## 2020-02-01 LAB — CULTURE, BLOOD (ROUTINE X 2)
Culture: NO GROWTH
Culture: NO GROWTH
Special Requests: ADEQUATE

## 2020-02-02 ENCOUNTER — Other Ambulatory Visit: Payer: Self-pay

## 2020-02-02 ENCOUNTER — Encounter: Payer: Self-pay | Admitting: Nurse Practitioner

## 2020-02-02 ENCOUNTER — Ambulatory Visit: Payer: Self-pay | Admitting: General Practice

## 2020-02-02 ENCOUNTER — Ambulatory Visit (INDEPENDENT_AMBULATORY_CARE_PROVIDER_SITE_OTHER): Payer: Medicare Other | Admitting: Nurse Practitioner

## 2020-02-02 VITALS — BP 138/82 | HR 88 | Temp 98.2°F

## 2020-02-02 DIAGNOSIS — R3129 Other microscopic hematuria: Secondary | ICD-10-CM | POA: Diagnosis not present

## 2020-02-02 DIAGNOSIS — R350 Frequency of micturition: Secondary | ICD-10-CM | POA: Diagnosis not present

## 2020-02-02 DIAGNOSIS — R251 Tremor, unspecified: Secondary | ICD-10-CM

## 2020-02-02 DIAGNOSIS — E785 Hyperlipidemia, unspecified: Secondary | ICD-10-CM

## 2020-02-02 DIAGNOSIS — E1169 Type 2 diabetes mellitus with other specified complication: Secondary | ICD-10-CM

## 2020-02-02 DIAGNOSIS — I639 Cerebral infarction, unspecified: Secondary | ICD-10-CM

## 2020-02-02 DIAGNOSIS — A419 Sepsis, unspecified organism: Secondary | ICD-10-CM | POA: Diagnosis not present

## 2020-02-02 DIAGNOSIS — N401 Enlarged prostate with lower urinary tract symptoms: Secondary | ICD-10-CM

## 2020-02-02 DIAGNOSIS — J449 Chronic obstructive pulmonary disease, unspecified: Secondary | ICD-10-CM

## 2020-02-02 DIAGNOSIS — Z6841 Body Mass Index (BMI) 40.0 and over, adult: Secondary | ICD-10-CM

## 2020-02-02 DIAGNOSIS — Z7409 Other reduced mobility: Secondary | ICD-10-CM | POA: Insufficient documentation

## 2020-02-02 DIAGNOSIS — I152 Hypertension secondary to endocrine disorders: Secondary | ICD-10-CM | POA: Diagnosis not present

## 2020-02-02 DIAGNOSIS — R809 Proteinuria, unspecified: Secondary | ICD-10-CM

## 2020-02-02 DIAGNOSIS — E1129 Type 2 diabetes mellitus with other diabetic kidney complication: Secondary | ICD-10-CM

## 2020-02-02 DIAGNOSIS — E1159 Type 2 diabetes mellitus with other circulatory complications: Secondary | ICD-10-CM

## 2020-02-02 DIAGNOSIS — M79604 Pain in right leg: Secondary | ICD-10-CM

## 2020-02-02 DIAGNOSIS — Z8673 Personal history of transient ischemic attack (TIA), and cerebral infarction without residual deficits: Secondary | ICD-10-CM

## 2020-02-02 DIAGNOSIS — R296 Repeated falls: Secondary | ICD-10-CM

## 2020-02-02 NOTE — Assessment & Plan Note (Signed)
Chronic, ongoing.  Continue current medication regimen and adjust as needed.  Lipid panel today, recent LDL 31.

## 2020-02-02 NOTE — Assessment & Plan Note (Addendum)
Chronic, ongoing, unsure he is consistently taking Flomax and is having more symptoms.  Have recommend he start taking this consistently daily.  Check PSA today.  If consistent symptoms while taking medication daily, then consider urology referral.  Return in 4 weeks for follow-up.

## 2020-02-02 NOTE — Assessment & Plan Note (Addendum)
Chronic, ongoing with BP at goal on check today.  Educated him on goal of <130/90.  Has underlying circulation issues and have recommended he wear compression hose at home + decrease sodium intake.  Continue current medication regimen + continue collaboration with cardiology and [place] PCP.  Recommend he bring all medications to his provider visits and document all BP and BS readings, which should bring with him -- as is poor historian and unsure what he is exactly taking daily at home.  Reviewed medications exensively with him today -- he sees multiple specialists and [place] providers -- discussed with him that this can complicate matters and he should always bring medications with him to all visits.  CBC, CMP, TSH today.  Return in 3 months for visit with new physician in office.  Sooner if worsening symptoms.

## 2020-02-02 NOTE — Assessment & Plan Note (Signed)
Acute and improving at this time, but ongoing decompensation from recent hospitalization. Continue to work with home health and physical therapy.  Will work on getting walker for patient.

## 2020-02-02 NOTE — Assessment & Plan Note (Signed)
Chronic, ongoing, followed by neurology.  Continue this collaboration and medication regimen.  Return in 3 months. 

## 2020-02-02 NOTE — Chronic Care Management (AMB) (Signed)
Chronic Care Management   Follow Up Note   02/02/2020 Name: Justin Griffith MRN: [id number] DOB: April 01, 1940  Referred by: Guadalupe Maple, MD Reason for referral : Chronic Care Management (RNCM EMMI Red- Chronic Disease Management and Care Coordination and Care Coordination Needs)   Justin Griffith is a 79 y.o. year old male who is a primary care patient of Crissman, Jeannette How, MD. The CCM team was consulted for assistance with chronic disease management and care coordination needs.    Review of patient status, including review of consultants reports, relevant laboratory and other test results, and collaboration with appropriate care team members and the patient's provider was performed as part of comprehensive patient evaluation and provision of chronic care management services.    SDOH (Social Determinants of Health) assessments performed: Yes See Care Plan activities for detailed interventions related to [facility])     Outpatient Encounter Medications as of 02/02/2020  Medication Sig Note  . acyclovir (ZOVIRAX) 400 MG tablet Take 400 mg by mouth 2 (two) times daily.   Marland Kitchen aspirin EC 325 MG EC tablet Take 1 tablet (325 mg total) by mouth daily.   Marland Kitchen azelastine (ASTELIN) 0.1 % nasal spray Place 2 sprays into both nostrils 2 (two) times daily.    . carvedilol (COREG) 12.5 MG tablet Take 2 tablets (25 mg total) by mouth 2 (two) times daily with a meal.   . clobetasol cream (TEMOVATE) 0.93 % Apply 1 application topically 2 (two) times daily. Apply to bilateral lower legs.   . cloNIDine (CATAPRES) 0.2 MG tablet Take 0.2 mg by mouth 2 (two) times daily.   Marland Kitchen donepezil (ARICEPT) 5 MG tablet Take 1 tablet (5 mg total) by mouth at bedtime.   . furosemide (LASIX) 20 MG tablet Take 1 tablet (20 mg total) by mouth daily.   . insulin glargine (LANTUS) 100 UNIT/ML injection Inject 0.25 mLs (25 Units total) into the skin daily.   Marland Kitchen ipratropium-albuterol (DUONEB) 0.5-2.5 (3) MG/3ML SOLN Take 3 mLs by  nebulization every 6 (six) hours. 02/21/2019: Taking QID   . metFORMIN (GLUCOPHAGE) 1000 MG tablet Take 1,000 mg by mouth 2 (two) times daily with a meal.   . mupirocin ointment (BACTROBAN) 2 % Apply 1 application topically 2 (two) times daily. To open blisters bilateral legs.   Marland Kitchen olmesartan (BENICAR) 40 MG tablet Take 1 tablet (40 mg total) by mouth daily.   . primidone (MYSOLINE) 50 MG tablet Take 1 tablet (50 mg total) by mouth 2 (two) times daily.   Marland Kitchen QVAR REDIHALER 40 MCG/ACT inhaler 1 puff 2 (two) times daily.  05/23/2019: Using PRN  . rosuvastatin (CRESTOR) 40 MG tablet Take 1 tablet (40 mg total) by mouth at bedtime. (Patient taking differently: Take 40 mg by mouth at bedtime. 20 mg once a day)   . tamsulosin (FLOMAX) 0.4 MG CAPS capsule Take 0.4 mg by mouth daily. Patient restarted on his own due to trouble urinating   . vitamin B-12 (CYANOCOBALAMIN) 500 MCG tablet Take 1,000 mcg by mouth daily. Two at night   . Vitamin D, Cholecalciferol, 25 MCG (1000 UT) CAPS Take 50 mcg by mouth daily. 2 a night    No facility-administered encounter medications on file as of 02/02/2020.     Objective:  BP Readings from Last 3 Encounters:  01/29/20 (!) 176/84  01/12/20 140/80  01/01/20 138/82    Goals Addressed              This Visit's Progress   .  RNCM: Pt- " I am concerned about the swelling in my legs" (pt-stated)        CARE PLAN ENTRY (see longtitudinal plan of care for additional care plan information)  Current Barriers:  . Chronic Disease Management support, education, and care coordination needs related to HTN, HLD, COPD, and DMII  Clinical Goal(s) related to HTN, HLD, COPD, and DMII:  Over the next 120 days, patient will:  . Work with the care management team to address educational, disease management, and care coordination needs  . Begin or continue self health monitoring activities as directed today Measure and record cbg (blood glucose) 3-4 times daily and Measure and  record blood pressure 3 times per week . Call provider office for new or worsened signs and symptoms Blood glucose findings outside established parameters, Blood pressure findings outside established parameters, Weight outside established parameters, Oxygen saturation lower than established parameter, Shortness of breath, and New or worsened symptom related to Swelling in legs, and other changes in condition  . Call care management team with questions or concerns . Verbalize basic understanding of patient centered plan of care established today  Interventions related to HTN, HLD, COPD, and DMII:  . Evaluation of current treatment plans and patient's adherence to plan as established by provider.  Per the patient he has lost down to 302.  He has cut out ice cream and potato chips and eating one "good meal" a day. This is usually at lunch or supper.  The patient states that he has decreased his Lantus from 80 units to 50 units because the Lantus at 80 units was too much. Blood sugars are running 100-140. In the office today the patient states he has gain some weight back because of the holidays but he is back on his regimen and it will come back off. 02-02-2020: The patient states: "I don't feel good at all".  The patient states he does not have an appetite, he was tested in the hospital for [place] and he does not have. They told him he had an infection but could not tell him where. He does nothing but fall, pee, and can't sleep.  The patient says he feels no better than he was when he went to the hospital.  He is frustrated and upset.  Has post hospital follow up with Marnee Guarneri, NP today at 4 pm.  Encouraged the patient to write down any questions or concerns he had and discuss these with Jolene today at his appointment.  . Assessed patient understanding of disease states-patient is ready to make positive changes to improve his health and well being. The patient has made positive lifestyle changes to help  his health and well being. Praised for weight loss and changing his dietary habits. 02-02-2020: The patient post discharge form the hospital. The patient is frustrated and wants answers. He feels like everything with his health is messed up. Empathetic listening.  Encouraged the patient to bring his discharge paperwork with him to his appointment today and write down andy questions or concerns he has.  . Assessed patient's education and care coordination needs-the patient sees several different specialist and also the [name]. The patient has several healthcare practices he does at home. Education and support given.  . Evaluation of next appointment with pcp is on 02-02-2020 at 4 pm.  The patient states he has several things he wants to discuss with the pcp.  . Evaluation of swelling in bilateral legs and feet. The patient verbalized that his swelling  has gone down in his feet and legs considerably. He still has more in his left leg/foot  than right leg/foot. The patient has been unsuccessful at finding ted hose that work for him. Research done and there are 3XL size available for purchase. The RNCM will  have a pair of 3XL [place] Anti-embolism cap knee high inspection toe stockings 18 mmhg- white for the patient to try for help with reducing swelling further in his legs. Information for purchase of ted hose if this works for the patient is: http://young.com/. SKU#561- $4.39 a pair- does not include shipping and handling. Will have this information for the patient if he chooses to order more of the ted stockings. Saw the patient face to face in the office today. Took the ted hose to the patient. The patient feels these will work well for his legs. The patients legs have decreased swelling.  The left is more swollen than the right. The right appears to be normal and back to baseline. 02-02-2020: The patient states the swelling is back in his legs and he does not remember anything about the compression stockings  given to him by the [facility] to [place] for help with swelling. The patient states all he does is pee and go to the bathroom.  He states he has noticed blood in his urine and his private parts hurt. He verbalized his pee is a "burnt orange" in color.  . Provided disease specific education to patient-[place]/ADA diet.  The patient has had significant weight loss. Praised for positive dietary changes to promote health and well being. 02-02-2020: The patient states that he does not have an appetite and nothing taste good to him. He is very frustrated with all the health issues he has going on at this time. He states he only wants answers. Nash Dimmer with appropriate clinical care team members regarding patient needs.  Currently working with the CCM pharmacist. Will collaborate with the pcp and pharmacist concerning patients confusion with recent medication changes. The patient verbalized understanding at the end of the call but would like team support as the patient easily gets confused with his medications and how to take them. This is an ongoing issue and concern.  . Evaluation of new complaint of "shaking and losing balance".  The patient verbalized he shakes so much he can hardly sign his name. He said when he is standing he is losing his balance and his wife even said he looks like he is "swaying".  The patient states he does not know what to do about it. The patient also says that his right leg at the ankle sometimes has a shooting sensation in it and it is "terrible".  Says he can not tell that the gabapentin is helping. The patient did say he has not increased the dose to 2. Told the patient to try this as directed by [facility] and see if this helped. Did tell the patient RNCM would collaborate with the pcp and let her know of new findings. The patient has had new falls and the patient is requesting a walker. Have sent and in basket message to the pcp about getting a new walker (new goal opened for  falls) . Assessed SDOH. Ask the patient about how he was going to get to his appointment today. The patient states his wife is driving him. He had some issues with mini strokes in September and has been advised not to drive for at least 6 months. Education and support given.  Patient Self Care Activities related to HTN, HLD, COPD, and DMII:  . Patient is unable to independently self-manage chronic health conditions  Please see past updates related to this goal by clicking on the "Past Updates" button in the selected goal      .  RNCM: Pt-"My leg hurts and I am having a hard time walking" (pt-stated)        [place] (see longitudinal plan of care for additional care plan information)  Current Barriers:  Marland Kitchen Knowledge Deficits related to fall precautions in patient with  . Decreased adherence to prescribed treatment for fall prevention . Knowledge Deficits related to changes in health and inability to ambulate as a result of leg pain and discomfort and change in balance . Care Coordination needs related to the need for a walker to use in the home setting in a patient with new frequent falls and the need for DME . Chronic Disease Management support and education needs related to recent CVA with hospitalizations and new concerns with safety . Lacks caregiver support- wife has dementia but the patient states she can drive him to appointments.   . Non-adherence to prescribed medication regimen . Cognitive Deficits  Clinical Goal(s):  Marland Kitchen Over the next 120 days, patient will demonstrate improved adherence to prescribed treatment plan for decreasing falls as evidenced by patient reporting and review of EMR . Over the next 120 days, patient will verbalize using fall risk reduction strategies discussed . Over the next 120 days, patient will not experience additional falls . Over the next 120 days, patient will verbalize understanding of plan for fall reduction and utilization of DME in the home   . Over the next 120 days, patient will not experience hospital admission. Hospital Admissions in last 6 months = 2 admits in the last 6 months. . Over the next 120 days, patient will demonstrate a decrease in fall events exacerbations as evidenced by finding etiology of increased leg pain and changes in balance contributing to increased fall risk.  . Over the next 120 days, patient will attend all scheduled medical appointments: pcp appointment today at 4 pm . Over the next 120 days, the patient will demonstrate ongoing self health care management ability as evidenced by no new falls.  Interventions:  . Provided written and verbal education re: Potential causes of falls and Fall prevention strategies . Reviewed medications and discussed potential side effects of medications such as dizziness and frequent urination . Assessed for s/s of orthostatic hypotension . Assessed for falls since last encounter. . Assessed patients knowledge of fall risk prevention secondary to previously provided education. . Assessed working status of life alert bracelet and patient adherence . Provided patient information for fall alert systems . Evaluation of current treatment plan related to falls  and patient's adherence to plan as established by provider. . Advised patient to notify the provider of new falls when they occur . Provided education to patient re: safety precautions and preventing new falls . Collaborated with pcp regarding the need for a walker for the patient to use in the home setting . Discussed plans with patient for ongoing care management follow up and provided patient with direct contact information for care management team . Provided patient with safety educational materials related to increased falls, including hospitization  . Reviewed scheduled/upcoming provider appointments including: 02-02-2020 at 4 pm  Patient Self Care Activities:  . Utilize walker(assistive device) appropriately with  all ambulation . De-clutter walkways . Change positions slowly .  Wear secure fitting shoes at all times with ambulation . Utilize home lighting for dim lit areas . Have self and pet awareness at all times  Plan: . CCM RN CM will follow up in 30 to 60   Initial goal documentation         Plan:   Telephone follow up appointment with care management team member scheduled for: 03-08-2020 at 2:15 pm  [place], MSN, [facility] Mobile: [phone]

## 2020-02-02 NOTE — Assessment & Plan Note (Signed)
With T2DM and COPD. Recommended eating smaller high protein, low fat meals more frequently and exercising 30 mins a day 5 times a week with a goal of 10-15lb weight loss in the next 3 months. Patient voiced their understanding and motivation to adhere to these recommendations.  

## 2020-02-02 NOTE — Assessment & Plan Note (Signed)
Chronic, ongoing.  Followed by pulmonary.  Continue current medication regimen as prescribed by them and adjust as needed -- discussed need to take Qvar BID as is ordered.  Continue Mucinex at home as needed.

## 2020-02-02 NOTE — Patient Instructions (Signed)
Visit Information  Goals Addressed              This Visit's Progress   .  RNCM: Pt- " I am concerned about the swelling in my legs" (pt-stated)        CARE PLAN ENTRY (see longtitudinal plan of care for additional care plan information)  Current Barriers:  . Chronic Disease Management support, education, and care coordination needs related to HTN, HLD, COPD, and DMII  Clinical Goal(s) related to HTN, HLD, COPD, and DMII:  Over the next 120 days, patient will:  . Work with the care management team to address educational, disease management, and care coordination needs  . Begin or continue self health monitoring activities as directed today Measure and record cbg (blood glucose) 3-4 times daily and Measure and record blood pressure 3 times per week . Call provider office for new or worsened signs and symptoms Blood glucose findings outside established parameters, Blood pressure findings outside established parameters, Weight outside established parameters, Oxygen saturation lower than established parameter, Shortness of breath, and New or worsened symptom related to Swelling in legs, and other changes in condition  . Call care management team with questions or concerns . Verbalize basic understanding of patient centered plan of care established today  Interventions related to HTN, HLD, COPD, and DMII:  . Evaluation of current treatment plans and patient's adherence to plan as established by provider.  Per the patient he has lost down to 302.  He has cut out ice cream and potato chips and eating one "good meal" a day. This is usually at lunch or supper.  The patient states that he has decreased his Lantus from 80 units to 50 units because the Lantus at 80 units was too much. Blood sugars are running 100-140. In the office today the patient states he has gain some weight back because of the holidays but he is back on his regimen and it will come back off. 02-02-2020: The patient states: "I  don't feel good at all".  The patient states he does not have an appetite, he was tested in the hospital for [place] and he does not have. They told him he had an infection but could not tell him where. He does nothing but fall, pee, and can't sleep.  The patient says he feels no better than he was when he went to the hospital.  He is frustrated and upset.  Has post hospital follow up with Marnee Guarneri, NP today at 4 pm.  Encouraged the patient to write down any questions or concerns he had and discuss these with Jolene today at his appointment.  . Assessed patient understanding of disease states-patient is ready to make positive changes to improve his health and well being. The patient has made positive lifestyle changes to help his health and well being. Praised for weight loss and changing his dietary habits. 02-02-2020: The patient post discharge form the hospital. The patient is frustrated and wants answers. He feels like everything with his health is messed up. Empathetic listening.  Encouraged the patient to bring his discharge paperwork with him to his appointment today and write down andy questions or concerns he has.  . Assessed patient's education and care coordination needs-the patient sees several different specialist and also the [name]. The patient has several healthcare practices he does at home. Education and support given.  . Evaluation of next appointment with pcp is on 02-02-2020 at 4 pm.  The patient states he has several  things he wants to discuss with the pcp.  . Evaluation of swelling in bilateral legs and feet. The patient verbalized that his swelling has gone down in his feet and legs considerably. He still has more in his left leg/foot  than right leg/foot. The patient has been unsuccessful at finding ted hose that work for him. Research done and there are 3XL size available for purchase. The RNCM will  have a pair of 3XL [place] Anti-embolism cap knee high inspection toe stockings 18  mmhg- white for the patient to try for help with reducing swelling further in his legs. Information for purchase of ted hose if this works for the patient is: http://young.com/. SKU#[email or web address] $4.39 a pair- does not include shipping and handling. Will have this information for the patient if he chooses to order more of the ted stockings. Saw the patient face to face in the office today. Took the ted hose to the patient. The patient feels these will work well for his legs. The patients legs have decreased swelling.  The left is more swollen than the right. The right appears to be normal and back to baseline. 02-02-2020: The patient states the swelling is back in his legs and he does not remember anything about the compression stockings given to him by the [place] [facility] [place] for help with swelling. The patient states all he does is pee and go to the bathroom.  He states he has noticed blood in his urine and his private parts hurt. He verbalized his pee is a "burnt orange" in color.  . Provided disease specific education to patient-[place]/ADA diet.  The patient has had significant weight loss. Praised for positive dietary changes to promote health and well being. 02-02-2020: The patient states that he does not have an appetite and nothing taste good to him. He is very frustrated with all the health issues he has going on at this time. He states he only wants answers. Nash Dimmer with appropriate clinical care team members regarding patient needs.  Currently working with the CCM pharmacist. Will collaborate with the pcp and pharmacist concerning patients confusion with recent medication changes. The patient verbalized understanding at the end of the call but would like team support as the patient easily gets confused with his medications and how to take them. This is an ongoing issue and concern.  . Evaluation of new complaint of "shaking and losing balance".  The patient verbalized he shakes so much he can hardly sign  his name. He said when he is standing he is losing his balance and his wife even said he looks like he is "swaying".  The patient states he does not know what to do about it. The patient also says that his right leg at the ankle sometimes has a shooting sensation in it and it is "terrible".  Says he can not tell that the gabapentin is helping. The patient did say he has not increased the dose to 2. Told the patient to try this as directed by [facility] and see if this helped. Did tell the patient RNCM would collaborate with the pcp and let her know of new findings. The patient has had new falls and the patient is requesting a walker. Have sent and in basket message to the pcp about getting a new walker (new goal opened for falls) . Assessed SDOH. Ask the patient about how he was going to get to his appointment today. The patient states his wife is driving him. He had  some issues with mini strokes in September and has been advised not to drive for at least 6 months. Education and support given.   Patient Self Care Activities related to HTN, HLD, COPD, and DMII:  . Patient is unable to independently self-manage chronic health conditions  Please see past updates related to this goal by clicking on the "Past Updates" button in the selected goal      .  RNCM: Pt-"My leg hurts and I am having a hard time walking" (pt-stated)        [place] (see longitudinal plan of care for additional care plan information)  Current Barriers:  Marland Kitchen Knowledge Deficits related to fall precautions in patient with  . Decreased adherence to prescribed treatment for fall prevention . Knowledge Deficits related to changes in health and inability to ambulate as a result of leg pain and discomfort and change in balance . Care Coordination needs related to the need for a walker to use in the home setting in a patient with new frequent falls and the need for DME . Chronic Disease Management support and education needs related to  recent CVA with hospitalizations and new concerns with safety . Lacks caregiver support- wife has dementia but the patient states she can drive him to appointments.   . Non-adherence to prescribed medication regimen . Cognitive Deficits  Clinical Goal(s):  Marland Kitchen Over the next 120 days, patient will demonstrate improved adherence to prescribed treatment plan for decreasing falls as evidenced by patient reporting and review of EMR . Over the next 120 days, patient will verbalize using fall risk reduction strategies discussed . Over the next 120 days, patient will not experience additional falls . Over the next 120 days, patient will verbalize understanding of plan for fall reduction and utilization of DME in the home  . Over the next 120 days, patient will not experience hospital admission. Hospital Admissions in last 6 months = 2 admits in the last 6 months. . Over the next 120 days, patient will demonstrate a decrease in fall events exacerbations as evidenced by finding etiology of increased leg pain and changes in balance contributing to increased fall risk.  . Over the next 120 days, patient will attend all scheduled medical appointments: pcp appointment today at 4 pm . Over the next 120 days, the patient will demonstrate ongoing self health care management ability as evidenced by no new falls.  Interventions:  . Provided written and verbal education re: Potential causes of falls and Fall prevention strategies . Reviewed medications and discussed potential side effects of medications such as dizziness and frequent urination . Assessed for s/s of orthostatic hypotension . Assessed for falls since last encounter. . Assessed patients knowledge of fall risk prevention secondary to previously provided education. . Assessed working status of life alert bracelet and patient adherence . Provided patient information for fall alert systems . Evaluation of current treatment plan related to falls  and  patient's adherence to plan as established by provider. . Advised patient to notify the provider of new falls when they occur . Provided education to patient re: safety precautions and preventing new falls . Collaborated with pcp regarding the need for a walker for the patient to use in the home setting . Discussed plans with patient for ongoing care management follow up and provided patient with direct contact information for care management team . Provided patient with safety educational materials related to increased falls, including hospitization  . Reviewed scheduled/upcoming provider appointments including:  02-02-2020 at 4 pm  Patient Self Care Activities:  . Utilize walker(assistive device) appropriately with all ambulation . De-clutter walkways . Change positions slowly . Wear secure fitting shoes at all times with ambulation . Utilize home lighting for dim lit areas . Have self and pet awareness at all times  Plan: . CCM RN CM will follow up in 30 to 60   Initial goal documentation        Print copy of patient instructions, educational materials, and care plan provided in person.  Telephone follow up appointment with care management team member scheduled for:03-08-2020 at 2:15 pm  Noreene Larsson RN, MSN, [facility] Mobile: [phone]  It is important to avoid accidents which may result in broken bones.  Here are a few ideas on how to make your home safer so you will be less likely to trip or fall.  1. Use nonskid mats or non slip strips in your shower or tub, on your bathroom floor and around sinks.  If you know that you have spilled water, wipe it up! 2. In the bathroom, it is important to have properly installed grab bars on the walls or on the edge of the tub.  Towel racks are NOT strong enough for you to hold onto or to pull on for support. 3. Stairs and hallways should have enough  light.  Add lamps or night lights if you need ore light. 4. It is good to have handrails on both sides of the stairs if possible.  Always fix broken handrails right away. 5. It is important to see the edges of steps.  Paint the edges of outdoor steps white so you can see them better.  Put colored tape on the edge of inside steps. 6. Throw-rugs are dangerous because they can slide.  Removing the rugs is the best idea, but if they must stay, add adhesive carpet tape to prevent slipping. 7. Do not keep things on stairs or in the halls.  Remove small furniture that blocks the halls as it may cause you to trip.  Keep telephone and electrical cords out of the way where you walk. 8. Always were sturdy, rubber-soled shoes for good support.  Never wear just socks, especially on the stairs.  Socks may cause you to slip or fall.  Do not wear full-length housecoats as you can easily trip on the bottom.  9. Place the things you use the most on the shelves that are the easiest to reach.  If you use a stepstool, make sure it is in good condition.  If you feel unsteady, DO NOT climb, ask for help. 10. If a health professional advises you to use a cane or walker, do not be ashamed.  These items can keep you from falling and breaking your bones.

## 2020-02-02 NOTE — Assessment & Plan Note (Signed)
Chronic, ongoing with poor control.  Followed by endocrinology with A1C 7.5% today, January urine ALB 150 and A:C >300, continue Olmesartan for kidney protection.  Continue current collaboration endocrinology and medication regimen as prescribed by them.  Recommend focus on modest weight loss and diet changes.  Reviewed endocrinology notes.  Continue collaboration with CCM team to assist in ongoing education about diabetes.  Return in 3 months for visit with new physician in office.

## 2020-02-02 NOTE — Assessment & Plan Note (Signed)
Noted on UA today 2+ BLD and 3+ PRT, had foley catheter in hospital, some possible trauma with this as he reports it "hurt a lot" going in and that they "had difficulty".  Unsure he is consistently taking Tamsulosin.  Have educated him on this and importance of taking daily.  Will plan to recheck next visit.  Recommend he increase water intake at home.

## 2020-02-02 NOTE — Assessment & Plan Note (Signed)
Due to chronic illness and decompensation.  Continue to work with home health and physical therapy.  Will order a walker for him to assist with mobility and prevent falls.

## 2020-02-02 NOTE — Patient Instructions (Signed)

## 2020-02-02 NOTE — Progress Notes (Signed)
BP 138/82 (BP Location: Left Arm)   Pulse 88   Temp 98.2 F (36.8 C) (Oral)   SpO2 93%    Subjective:    Patient ID: Justin Griffith, male    DOB: Aug 13, 1940, 79 y.o.   MRN: [id number]  HPI: Justin Griffith is a 79 y.o. male  Chief Complaint  Patient presents with  . Hospitalization Follow-up   Transition of Care Hospital Follow up.   Was admitted to hospital on 01/23/2020 for sepsis of unknown origin.  At this time he reports overall feeling better, but states he has ongoing weakness and would benefit from walker at home for fall prevention.  He is to follow with neurology and cardiology next week.  "Recommendations for Outpatient Follow-up:  1. Follow outpatient CBC/CMP 2. Follow pending HSV PCR outpatient 3. Please follow up with neurology outpatient for excessive sleepiness 4. Concern for LOC? At previous hospitalization, he should not drive for 6 months since that event, follow with neurology outpatient  5. BG's reasonable here on 25 units lantus daily.  Will reduce lantus to 25 units daily.  Needs close follow up and adjustment outpatient 6. Clonidine d/c'd on admission, BP's elevated, resume on d/c and follow BP on d/c and adjust as needed 7. Consider cardiology follow up for elevated troponin 8. Follow pending urine cx 9. Follow with urology outpatient for hematuria, BPH, may need cystoscopy  Discharge Diagnoses:  Principal Problem:   Sepsis ([place]) Active Problems:   Hypertension associated with diabetes ([place])   Type 2 diabetes mellitus with proteinuria ([place])   Hyperlipidemia associated with type 2 diabetes mellitus ([place])   Sleep apnea   BPH (benign prostatic hyperplasia)   COPD (chronic obstructive pulmonary disease) ([facility])   Elevated troponin   Coronary artery disease   History of CVA (cerebrovascular accident)   Gross hematuria   Discharge Condition: stable  Diet recommendation: carb modified       Filed Weights   01/27/20 0130 01/28/20 0500 01/29/20  0743  Weight: (!) 137.9 kg 133.7 kg 133 kg    History of present illness:  Justin Griffith a 79 y.o.malewith medical history significant forCOPD, chronic respiratory failure with hypoxia on 3 L of home O2 via [place], CAD, PVD with chronic venous stasisdermatitis, history of CVA, T2DM, HTN, HLD, BPH, essential tremor, and OSA on CPAP whois admitted with concern for sepsis without a clear source.  He's improved with antibiotics and conservative care.  HSV PCR from LP is pending on discharge, though he's improved to his baseline without antivirals, so will plan for outpatient follow up.  He'll need outpatient follow up with his PCP and neurology as an outpatient as well as cardiology.    Hospital Course:  SIRSwith unspecified source: Patient presenting on arrival with fever, tachycardia, tachypnea, and leukocytosis.No obvious infectious source found at this time. CT chest/abdomen/pelvis without acute abnormalities - mild perinephric stranding (see report). Urinalysis with rare bacteria microscopy. SARS-CoV-2 and influenza A/B PCR's are negative.  - source continues to be unclear, though he seems to have improved on abx. Possible bacterial infection vs viral? Sepsis ruled out. - he's completed 7 days abx --MRSA screen neg, vanc d/c'ed --procal 0.87 on 11/3 -- LP with 3 WBC's, RBC 26, glucose 88, total protein 67 - not c/w bacterial meningitis, elevated protein could be related to diabetes -> follow HSV pcr, though low suspicion, will not start antiviral at this time, especially with his clinical improvement (discussed LP with neuro) - follow HSV  PCR(pending at time of discharge) -- follow CSF cxNG -- urine cx NG, blood cx NGTD -- repeat UA and cx, complained of dysuria/frequency today - not c/w UTI, follow outpatient, follow pending urine cx  Somnolence Acute Metabolic Encephalopathy --Pt could barely stay awake. Not on sedating medications. VBG without hypercarbia. Ammonia 19.  Follow B12 (wnl), TSH (wnl).  -- improved today, unclear cause at this point, he notes some chronic sleepiness(months)- continue cpap nightly  -- of note, he notes episode of loss of consciousness with previous hospitalization in September - he was to follow up with neurology outpatient - he should not drive for 6 months - follow up with neurology outpatient -- follow outpatient with neurology for his chronic sleepiness, though this seems improvedat this point  elevated troponin2/2 demand ischemia ACS ruled out Hx of CAD High-sensitivity troponin initially elevated 387 with downward trend on repeat 355. Patient does have history of CAD. He denies any recent chest pain.Suspect demand ischemia in setting ofpossiblesepsis. - echo without RWMA, EF 60-65% PLAN: --cardiology consult - no recommendation for additional cardiac diagnostics - continue coreg, irbesartan, and crestor, continue aspirin  BPH withgrosshematuria: --urology consulted.Hematuria suspected 2/2 foley trauma --foley d/c'ed PLAN: --cont flomax --Monitor urine output -- follow with urology outpatient  Falls at home/generalized weakness: Reports multiple falls at home with no significant injury. -Request PT/OT eval -Continue fall precautions  COPD chronic respiratory failure with hypoxiaon home 3L Stable without acute exacerbation.--cont Pulmicort neb and DuoNeb --cont home suppl O2  Hypomagnesemia: --Monitor and replete PRN  History of CVA: Punctate acute 3 mm acute ischemic nonhemorrhagic cortical infarct involving the right occipital lobe noted on last admission with MRI brain 12/14/2019. -Continue aspirin and statin  Type 2 diabetes: A1c 7.5 on 12/15/2019. --cont Lantus 25u nightly --SSI  Hypertension: BP improved -Continue Coreg 25 mg twice daily -Continue olmesartan 40 mg daily (switchedto irbesartan per pharmacy formulary while in hospital) -Holding home Lasix for now -DC  losartan from home meds -resume clonidine given elevated BP in house - follow bp outpatient   Hyperlipidemia: Continue rosuvastatin.  OSA: Continue CPAP nightly."   Hospital/Facility: D/C Physician:  D/C Date:   Records Requested:  Records Received:  Records Reviewed:   Diagnoses on Discharge:   Date of interactive Contact within 48 hours of discharge:  Contact was through: phone  Date of 7 day or 14 day face-to-face visit:    within 14 days  Outpatient Encounter Medications as of 02/02/2020  Medication Sig Note  . acyclovir (ZOVIRAX) 400 MG tablet Take 400 mg by mouth 2 (two) times daily.   Marland Kitchen aspirin EC 325 MG EC tablet Take 1 tablet (325 mg total) by mouth daily.   Marland Kitchen azelastine (ASTELIN) 0.1 % nasal spray Place 2 sprays into both nostrils 2 (two) times daily.    . carvedilol (COREG) 12.5 MG tablet Take 2 tablets (25 mg total) by mouth 2 (two) times daily with a meal.   . clobetasol cream (TEMOVATE) 6.43 % Apply 1 application topically 2 (two) times daily. Apply to bilateral lower legs.   . cloNIDine (CATAPRES) 0.2 MG tablet Take 0.2 mg by mouth 2 (two) times daily.   Marland Kitchen donepezil (ARICEPT) 5 MG tablet Take 1 tablet (5 mg total) by mouth at bedtime.   . furosemide (LASIX) 20 MG tablet Take 1 tablet (20 mg total) by mouth daily.   . insulin glargine (LANTUS) 100 UNIT/ML injection Inject 0.25 mLs (25 Units total) into the skin daily.   Marland Kitchen ipratropium-albuterol (  DUONEB) 0.5-2.5 (3) MG/3ML SOLN Take 3 mLs by nebulization every 6 (six) hours. 02/21/2019: Taking QID   . metFORMIN (GLUCOPHAGE) 1000 MG tablet Take 1,000 mg by mouth 2 (two) times daily with a meal.   . mupirocin ointment (BACTROBAN) 2 % Apply 1 application topically 2 (two) times daily. To open blisters bilateral legs.   Marland Kitchen olmesartan (BENICAR) 40 MG tablet Take 1 tablet (40 mg total) by mouth daily.   . primidone (MYSOLINE) 50 MG tablet Take 1 tablet (50 mg total) by mouth 2 (two) times daily.   Marland Kitchen QVAR REDIHALER 40  MCG/ACT inhaler 1 puff 2 (two) times daily.  05/23/2019: Using PRN  . rosuvastatin (CRESTOR) 40 MG tablet Take 1 tablet (40 mg total) by mouth at bedtime. (Patient taking differently: Take 40 mg by mouth at bedtime. 20 mg once a day)   . tamsulosin (FLOMAX) 0.4 MG CAPS capsule Take 0.4 mg by mouth daily. Patient restarted on his own due to trouble urinating   . vitamin B-12 (CYANOCOBALAMIN) 500 MCG tablet Take 1,000 mcg by mouth daily. Two at night   . Vitamin D, Cholecalciferol, 25 MCG (1000 UT) CAPS Take 50 mcg by mouth daily. 2 a night    No facility-administered encounter medications on file as of 02/02/2020.    Diagnostic Tests Reviewed/Disposition: Reviewed on chart  Consults: none  Discharge Instructions: Follow-up with PCP  Disease/illness Education: educated patient on recent hospital visit and changes.  Home Health/Community Services Discussions/Referrals: [facility] currently following  Establishment or re-establishment of referral orders for community resources: none  Discussion with other health care providers: reviewed notes  Assessment and Support of treatment regimen adherence: reviewed with patient and highlighted/explained medications via his list  Appointments Coordinated with: patient  Education for self-management, independent living, and ADLs: reviewed with patient  DIABETES Taking Lantus 25 units and Metformin 1000 MG BID.  A1c on 12/15/19 == 7.5%.  Last saw [name] on 01/03/20.  Reports loss of appetite that started before the hospital visit, craving sweets, but not a big appetite.    He reports a need for a walker, states he has a tendency to fall he reports and would benefit from walker as assistance to prevent falls.  Currently has physical therapy in home assisting him. Hypoglycemic episodes:no Polydipsia/polyuria: no Visual disturbance: no Chest pain: no Paresthesias: no Glucose Monitoring: no  Accucheck frequency: Not Checking  Fasting  glucose:  Post prandial:  Evening:  Before meals: Taking Insulin?: yes  Long acting insulin:  Short acting insulin: Blood Pressure Monitoring: not checking Retinal Examination: Up to Date Foot Exam: Up to Date Pneumovax: Up to Date Influenza: Up to Date Aspirin: yes   HYPERTENSION / HYPERLIPIDEMIA Continues on ASA, Carvedilol, Clonidine, Olmesartan, and was started on Lasix in hospital (reports he is going to stop taking as making him pee so much).  To be taking Tamsulosin for BPH, he is not sure if he is and reports urinary frequency since leaving hospital -- he did have a foley catheter.  He is followed by neurology for tremor to upper extremities and balance issues, sees them on Monday.   Satisfied with current treatment? yes Duration of hypertension: chronic BP monitoring frequency: not checking BP range:  BP medication side effects: no Duration of hyperlipidemia: chronic Cholesterol medication side effects: no Cholesterol supplements: none Medication compliance: good compliance Aspirin: yes Recent stressors: no Recurrent headaches: no Visual changes: no Palpitations: no Dyspnea: no Chest pain: no Lower extremity edema: at baseline Dizzy/lightheaded: no  Relevant past medical, surgical, family and social history reviewed and updated as indicated. Interim medical history since our last visit reviewed. Allergies and medications reviewed and updated.  Review of Systems  Constitutional: Negative for activity change, diaphoresis, fatigue and fever.  Respiratory: Negative for cough, chest tightness, shortness of breath and wheezing.   Cardiovascular: Negative for chest pain, palpitations and leg swelling.  Gastrointestinal: Negative.   Endocrine: Negative for cold intolerance, heat intolerance, polydipsia, polyphagia and polyuria.  Neurological: Negative.   Psychiatric/Behavioral: Negative.     Per HPI unless specifically indicated above     Objective:    BP  138/82 (BP Location: Left Arm)   Pulse 88   Temp 98.2 F (36.8 C) (Oral)   SpO2 93%   Wt Readings from Last 3 Encounters:  01/29/20 293 lb 3.4 oz (133 kg)  01/12/20 290 lb 3.2 oz (131.6 kg)  01/01/20 293 lb 12.8 oz (133.3 kg)    Physical Exam Vitals and nursing note reviewed.  Constitutional:      General: He is awake. He is not in acute distress.    Appearance: He is well-developed and well-groomed. He is morbidly obese. He is not ill-appearing.  HENT:     Head: Normocephalic and atraumatic.     Right Ear: Hearing, tympanic membrane, ear canal and external ear normal. No drainage.     Left Ear: Hearing, tympanic membrane, ear canal and external ear normal. No drainage.     Mouth/Throat:     Pharynx: Uvula midline. No pharyngeal swelling, oropharyngeal exudate or posterior oropharyngeal erythema.  Eyes:     General: Lids are normal.        Right eye: No discharge.        Left eye: No discharge.     Conjunctiva/sclera: Conjunctivae normal.     Pupils: Pupils are equal, round, and reactive to light.  Neck:     Thyroid: No thyromegaly.     Vascular: No carotid bruit or JVD.  Cardiovascular:     Rate and Rhythm: Normal rate and regular rhythm.     Heart sounds: Normal heart sounds, S1 normal and S2 normal. No murmur heard.  No gallop.   Pulmonary:     Effort: Pulmonary effort is normal. No accessory muscle usage or respiratory distress.     Breath sounds: Decreased breath sounds present. No wheezing.     Comments: Decreased breath sounds throughout, no wheezing.  No SOB with talking.  Mild SOB noted when walked from office door to exam room. Abdominal:     General: Bowel sounds are normal.     Palpations: Abdomen is soft. There is no hepatomegaly or splenomegaly.     Tenderness: There is no abdominal tenderness.  Musculoskeletal:        General: Normal range of motion.     Cervical back: Normal range of motion and neck supple.     Right lower leg: Edema (trace) present.      Left lower leg: Edema (trace) present.  Lymphadenopathy:     Cervical: No cervical adenopathy.  Skin:    General: Skin is warm and dry.     Capillary Refill: Capillary refill takes less than 2 seconds.     Comments: Rubor and xerosis bilateral lower extremity.  Neurological:     Mental Status: He is alert and oriented to person, place, and time.     Cranial Nerves: Cranial nerves are intact.     Motor: Tremor present.     Gait:  Gait is intact.     Deep Tendon Reflexes:     Reflex Scores:      Brachioradialis reflexes are 1+ on the right side and 1+ on the left side.      Patellar reflexes are 1+ on the right side and 1+ on the left side.    Comments: Baseline resting tremor noted to upper extremities R>L.  Negative Cogwheel.  Psychiatric:        Mood and Affect: Mood normal.        Behavior: Behavior normal. Behavior is cooperative.        Thought Content: Thought content normal.        Judgment: Judgment normal.    Results for orders placed or performed during the hospital encounter of 01/23/20  Respiratory Panel by RT PCR (Flu A&B, Covid) - Nasopharyngeal Swab   Specimen: Nasopharyngeal Swab  Result Value Ref Range   SARS Coronavirus 2 by RT PCR NEGATIVE NEGATIVE   Influenza A by PCR NEGATIVE NEGATIVE   Influenza B by PCR NEGATIVE NEGATIVE  Blood Culture (routine x 2)   Specimen: BLOOD  Result Value Ref Range   Specimen Description BLOOD BLOOD LEFT HAND    Special Requests      BOTTLES DRAWN AEROBIC ONLY Blood Culture results may not be optimal due to an inadequate volume of blood received in culture bottles   Culture      NO GROWTH 9 DAYS Performed at [facility], [address] [place], [place] [address]    Report Status 02/01/2020 FINAL   Blood Culture (routine x 2)   Specimen: BLOOD  Result Value Ref Range   Specimen Description BLOOD BLOOD RIGHT HAND    Special Requests      BOTTLES DRAWN AEROBIC AND ANAEROBIC Blood Culture adequate volume   Culture       NO GROWTH 9 DAYS Performed at [facility], [address] [place], [place] [address]    Report Status 02/01/2020 FINAL   Urine culture   Specimen: Urine, Random  Result Value Ref Range   Specimen Description      URINE, RANDOM Performed at [facility], [address], [place], [place] [address]    Special Requests      NONE Performed at [facility], [address], [place], [place] [address]    Culture      NO GROWTH Performed at [facility], [place] [address], [place], [place] [address]    Report Status 01/24/2020 FINAL   MRSA PCR Screening   Specimen: Nasal Mucosa; Nasopharyngeal  Result Value Ref Range   MRSA by PCR NEGATIVE NEGATIVE  Respiratory Panel by PCR   Specimen: Respiratory  Result Value Ref Range   Adenovirus NOT DETECTED NOT DETECTED   Coronavirus 229E NOT DETECTED NOT DETECTED   Coronavirus HKU1 NOT DETECTED NOT DETECTED   Coronavirus NL63 NOT DETECTED NOT DETECTED   Coronavirus OC43 NOT DETECTED NOT DETECTED   Metapneumovirus NOT DETECTED NOT DETECTED   Rhinovirus / Enterovirus NOT DETECTED NOT DETECTED   Influenza A NOT DETECTED NOT DETECTED   Influenza B NOT DETECTED NOT DETECTED   Parainfluenza Virus 1 NOT DETECTED NOT DETECTED   Parainfluenza Virus 2 NOT DETECTED NOT DETECTED   Parainfluenza Virus 3 NOT DETECTED NOT DETECTED   Parainfluenza Virus 4 NOT DETECTED NOT DETECTED   Respiratory Syncytial Virus NOT DETECTED NOT DETECTED   Bordetella pertussis NOT DETECTED NOT DETECTED   Chlamydophila pneumoniae NOT DETECTED  NOT DETECTED   Mycoplasma pneumoniae NOT DETECTED NOT DETECTED  CSF culture   Specimen: PATH Cytology CSF; Cerebrospinal Fluid  Result Value Ref Range   Specimen Description      CSF Performed at [facility], [address], [place], [place] [address]    Special Requests      CSF Performed at [place], [place] [address]    Gram Stain       NO ORGANISMS SEEN NO WBC SEEN RBC'S SEEN Performed at [facility], [address] [place], [place] [address]    Culture      NO GROWTH 3 DAYS Performed at [facility], [place] [address], [place], [place] [address]    Report Status 01/29/2020 FINAL   Urine Culture   Specimen: Urine, Random  Result Value Ref Range   Specimen Description      URINE, RANDOM Performed at [facility], [address] [place], [place] [address]    Special Requests      NONE Performed at [facility], [address], [place], [place] [address]    Culture      NO GROWTH Performed at [facility], [place] [address] [place], [place] [address]    Report Status 01/29/2020 FINAL   Lactic acid, plasma  Result Value Ref Range   Lactic Acid, Venous 1.8 0.5 - 1.9 mmol/L  Urinalysis, Complete w Microscopic Urine, Catheterized  Result Value Ref Range   Color, Urine STRAW (A) YELLOW   APPearance CLEAR (A) CLEAR   Specific Gravity, Urine 1.008 1.005 - 1.030   pH 6.0 5.0 - 8.0   Glucose, UA NEGATIVE NEGATIVE mg/dL   Hgb urine dipstick LARGE (A) NEGATIVE   Bilirubin Urine NEGATIVE NEGATIVE   Ketones, ur NEGATIVE NEGATIVE mg/dL   Protein, ur 100 (A) NEGATIVE mg/dL   Nitrite NEGATIVE NEGATIVE   Leukocytes,Ua NEGATIVE NEGATIVE   RBC / HPF 0-5 0 - 5 RBC/hpf   WBC, UA 0-5 0 - 5 WBC/hpf   Bacteria, UA RARE (A) NONE SEEN   Squamous Epithelial / LPF NONE SEEN 0 - 5   Mucus PRESENT    Amorphous Crystal PRESENT   Brain natriuretic peptide  Result Value Ref Range   B Natriuretic Peptide 76.3 0.0 - 100.0 pg/mL  CBC with Differential/Platelet  Result Value Ref Range   WBC 13.7 (H) 4.0 - 10.5 K/uL   RBC 4.98 4.22 - 5.81 MIL/uL   Hemoglobin 15.4 13.0 - 17.0 g/dL   HCT 45.3 39 - 52 %   MCV 91.0 80.0 - 100.0 fL   MCH 30.9 26.0 - 34.0 pg   MCHC 34.0 30.0 - 36.0 g/dL   RDW 12.7 11.5 - 15.5 %   Platelets 219 150 - 400 K/uL   nRBC 0.0 0.0 - 0.2 %   Neutrophils Relative % 85  %   Neutro Abs 11.7 (H) 1.7 - 7.7 K/uL   Lymphocytes Relative 9 %   Lymphs Abs 1.2 0.7 - 4.0 K/uL   Monocytes Relative 5 %   Monocytes Absolute 0.7 0.1 - 1.0 K/uL   Eosinophils Relative 0 %   Eosinophils Absolute 0.0 0.0 - 0.5 K/uL   Basophils Relative 0 %   Basophils Absolute 0.1 0.0 - 0.1 K/uL   Immature Granulocytes 1 %   Abs Immature Granulocytes 0.10 (H) 0.00 - 0.07 K/uL  Comprehensive metabolic panel  Result Value Ref Range   Sodium 137 135 -  145 mmol/L   Potassium 3.9 3.5 - 5.1 mmol/L   Chloride 98 98 - 111 mmol/L   CO2 25 22 - 32 mmol/L   Glucose, Bld 122 (H) 70 - 99 mg/dL   BUN 20 8 - 23 mg/dL   Creatinine, Ser 1.06 0.61 - 1.24 mg/dL   Calcium 9.8 8.9 - 10.3 mg/dL   Total Protein 7.1 6.5 - 8.1 g/dL   Albumin 3.4 (L) 3.5 - 5.0 g/dL   AST 31 15 - 41 U/L   ALT 17 0 - 44 U/L   Alkaline Phosphatase 52 38 - 126 U/L   Total Bilirubin 1.7 (H) 0.3 - 1.2 mg/dL   GFR, Estimated >60 >60 mL/min   Anion gap 14 5 - 15  Lactic acid, plasma  Result Value Ref Range   Lactic Acid, Venous 1.8 0.5 - 1.9 mmol/L  Magnesium  Result Value Ref Range   Magnesium 1.6 (L) 1.7 - 2.4 mg/dL  Protime-INR  Result Value Ref Range   Prothrombin Time 14.1 11.4 - 15.2 seconds   INR 1.1 0.8 - 1.2  APTT  Result Value Ref Range   aPTT 28 24 - 36 seconds  Blood gas, venous  Result Value Ref Range   pH, Ven 7.35 7.25 - 7.43   pCO2, Ven 58 44 - 60 mmHg   Bicarbonate 32.0 (H) 20.0 - 28.0 mmol/L   Acid-Base Excess 4.5 (H) 0.0 - 2.0 mmol/L   O2 Saturation 38.9 %   Patient temperature 37.0    Collection site VEIN    Sample type VENOUS   CBC  Result Value Ref Range   WBC 15.3 (H) 4.0 - 10.5 K/uL   RBC 4.38 4.22 - 5.81 MIL/uL   Hemoglobin 13.7 13.0 - 17.0 g/dL   HCT 39.8 39 - 52 %   MCV 90.9 80.0 - 100.0 fL   MCH 31.3 26.0 - 34.0 pg   MCHC 34.4 30.0 - 36.0 g/dL   RDW 12.9 11.5 - 15.5 %   Platelets 190 150 - 400 K/uL   nRBC 0.0 0.0 - 0.2 %  TSH  Result Value Ref Range   TSH 1.332 0.350 -  4.500 uIU/mL  Ammonia  Result Value Ref Range   Ammonia 19 9 - 35 umol/L  Procalcitonin  Result Value Ref Range   Procalcitonin 0.87 ng/mL  Basic metabolic panel  Result Value Ref Range   Sodium 133 (L) 135 - 145 mmol/L   Potassium 3.7 3.5 - 5.1 mmol/L   Chloride 98 98 - 111 mmol/L   CO2 28 22 - 32 mmol/L   Glucose, Bld 180 (H) 70 - 99 mg/dL   BUN 18 8 - 23 mg/dL   Creatinine, Ser 1.12 0.61 - 1.24 mg/dL   Calcium 9.4 8.9 - 10.3 mg/dL   GFR, Estimated >60 >60 mL/min   Anion gap 7 5 - 15  Magnesium  Result Value Ref Range   Magnesium 1.9 1.7 - 2.4 mg/dL  Glucose, capillary  Result Value Ref Range   Glucose-Capillary 181 (H) 70 - 99 mg/dL  Glucose, capillary  Result Value Ref Range   Glucose-Capillary 213 (H) 70 - 99 mg/dL  Blood gas, venous  Result Value Ref Range   pH, Ven 7.44 (H) 7.25 - 7.43   pCO2, Ven 41 (L) 44 - 60 mmHg   pO2, Ven 131.0 (H) 32 - 45 mmHg   Bicarbonate 27.8 20.0 - 28.0 mmol/L   Acid-Base Excess 3.3 (H) 0.0 - 2.0 mmol/L  O2 Saturation 99.1 %   Patient temperature 37.0    Collection site VEIN    Sample type VENOUS   Glucose, capillary  Result Value Ref Range   Glucose-Capillary 172 (H) 70 - 99 mg/dL  Vitamin B12  Result Value Ref Range   Vitamin B-12 785 180 - 914 pg/mL  TSH  Result Value Ref Range   TSH 2.069 0.350 - 4.500 uIU/mL  CBC with Differential/Platelet  Result Value Ref Range   WBC 8.5 4.0 - 10.5 K/uL   RBC 4.14 (L) 4.22 - 5.81 MIL/uL   Hemoglobin 13.0 13.0 - 17.0 g/dL   HCT 37.4 (L) 39 - 52 %   MCV 90.3 80.0 - 100.0 fL   MCH 31.4 26.0 - 34.0 pg   MCHC 34.8 30.0 - 36.0 g/dL   RDW 12.6 11.5 - 15.5 %   Platelets 169 150 - 400 K/uL   nRBC 0.0 0.0 - 0.2 %   Neutrophils Relative % 81 %   Neutro Abs 6.8 1.7 - 7.7 K/uL   Lymphocytes Relative 8 %   Lymphs Abs 0.7 0.7 - 4.0 K/uL   Monocytes Relative 10 %   Monocytes Absolute 0.8 0.1 - 1.0 K/uL   Eosinophils Relative 0 %   Eosinophils Absolute 0.0 0.0 - 0.5 K/uL   Basophils  Relative 0 %   Basophils Absolute 0.0 0.0 - 0.1 K/uL   Immature Granulocytes 1 %   Abs Immature Granulocytes 0.08 (H) 0.00 - 0.07 K/uL  Comprehensive metabolic panel  Result Value Ref Range   Sodium 132 (L) 135 - 145 mmol/L   Potassium 3.7 3.5 - 5.1 mmol/L   Chloride 98 98 - 111 mmol/L   CO2 27 22 - 32 mmol/L   Glucose, Bld 146 (H) 70 - 99 mg/dL   BUN 24 (H) 8 - 23 mg/dL   Creatinine, Ser 1.17 0.61 - 1.24 mg/dL   Calcium 9.0 8.9 - 10.3 mg/dL   Total Protein 6.0 (L) 6.5 - 8.1 g/dL   Albumin 2.6 (L) 3.5 - 5.0 g/dL   AST 21 15 - 41 U/L   ALT 15 0 - 44 U/L   Alkaline Phosphatase 52 38 - 126 U/L   Total Bilirubin 1.1 0.3 - 1.2 mg/dL   GFR, Estimated >60 >60 mL/min   Anion gap 7 5 - 15  Magnesium  Result Value Ref Range   Magnesium 1.9 1.7 - 2.4 mg/dL  Phosphorus  Result Value Ref Range   Phosphorus 2.2 (L) 2.5 - 4.6 mg/dL  Glucose, capillary  Result Value Ref Range   Glucose-Capillary 192 (H) 70 - 99 mg/dL  Glucose, capillary  Result Value Ref Range   Glucose-Capillary 133 (H) 70 - 99 mg/dL  Glucose, capillary  Result Value Ref Range   Glucose-Capillary 182 (H) 70 - 99 mg/dL  Glucose, capillary  Result Value Ref Range   Glucose-Capillary 172 (H) 70 - 99 mg/dL  Protime-INR  Result Value Ref Range   Prothrombin Time 14.3 11.4 - 15.2 seconds   INR 1.2 0.8 - 1.2  Basic metabolic panel  Result Value Ref Range   Sodium 129 (L) 135 - 145 mmol/L   Potassium 4.0 3.5 - 5.1 mmol/L   Chloride 95 (L) 98 - 111 mmol/L   CO2 25 22 - 32 mmol/L   Glucose, Bld 142 (H) 70 - 99 mg/dL   BUN 30 (H) 8 - 23 mg/dL   Creatinine, Ser 1.28 (H) 0.61 - 1.24 mg/dL   Calcium 9.4  8.9 - 10.3 mg/dL   GFR, Estimated 57 (L) >60 mL/min   Anion gap 9 5 - 15  CBC  Result Value Ref Range   WBC 6.2 4.0 - 10.5 K/uL   RBC 4.10 (L) 4.22 - 5.81 MIL/uL   Hemoglobin 12.6 (L) 13.0 - 17.0 g/dL   HCT 37.5 (L) 39 - 52 %   MCV 91.5 80.0 - 100.0 fL   MCH 30.7 26.0 - 34.0 pg   MCHC 33.6 30.0 - 36.0 g/dL   RDW  12.9 11.5 - 15.5 %   Platelets 161 150 - 400 K/uL   nRBC 0.0 0.0 - 0.2 %  Magnesium  Result Value Ref Range   Magnesium 2.1 1.7 - 2.4 mg/dL  Glucose, capillary  Result Value Ref Range   Glucose-Capillary 164 (H) 70 - 99 mg/dL  Glucose, capillary  Result Value Ref Range   Glucose-Capillary 126 (H) 70 - 99 mg/dL  Hepatic function panel  Result Value Ref Range   Total Protein 5.8 (L) 6.5 - 8.1 g/dL   Albumin 2.4 (L) 3.5 - 5.0 g/dL   AST 21 15 - 41 U/L   ALT 15 0 - 44 U/L   Alkaline Phosphatase 53 38 - 126 U/L   Total Bilirubin 0.6 0.3 - 1.2 mg/dL   Bilirubin, Direct 0.1 0.0 - 0.2 mg/dL   Indirect Bilirubin 0.5 0.3 - 0.9 mg/dL  Glucose, CSF  Result Value Ref Range   Glucose, CSF 88 (H) 40 - 70 mg/dL  Protein, CSF  Result Value Ref Range   Total  Protein, CSF 67 (H) 15 - 45 mg/dL  CSF cell count with differential  Result Value Ref Range   Tube # 1    Color, CSF COLORLESS COLORLESS   Appearance, CSF CLEAR CLEAR   Supernatant NOT INDICATED    RBC Count, CSF 26 (H) 0 - 3 /cu mm   WBC, CSF 3 0 - 5 /cu mm   Segmented Neutrophils-CSF 0 %   Lymphs, CSF 27 %   Monocyte-Macrophage-Spinal Fluid 73 %   Eosinophils, CSF 0 %  Glucose, capillary  Result Value Ref Range   Glucose-Capillary 198 (H) 70 - 99 mg/dL  Glucose, capillary  Result Value Ref Range   Glucose-Capillary 204 (H) 70 - 99 mg/dL  HSV 1/2 PCR, CSF Cerebrospinal Fluid  Result Value Ref Range   HSV-1 DNA Negative Negative   HSV-2 DNA Negative Negative  CBC with Differential/Platelet  Result Value Ref Range   WBC 6.6 4.0 - 10.5 K/uL   RBC 4.28 4.22 - 5.81 MIL/uL   Hemoglobin 13.1 13.0 - 17.0 g/dL   HCT 38.4 (L) 39 - 52 %   MCV 89.7 80.0 - 100.0 fL   MCH 30.6 26.0 - 34.0 pg   MCHC 34.1 30.0 - 36.0 g/dL   RDW 12.7 11.5 - 15.5 %   Platelets 197 150 - 400 K/uL   nRBC 0.0 0.0 - 0.2 %   Neutrophils Relative % 61 %   Neutro Abs 4.0 1.7 - 7.7 K/uL   Lymphocytes Relative 19 %   Lymphs Abs 1.2 0.7 - 4.0 K/uL    Monocytes Relative 16 %   Monocytes Absolute 1.1 (H) 0.1 - 1.0 K/uL   Eosinophils Relative 2 %   Eosinophils Absolute 0.2 0.0 - 0.5 K/uL   Basophils Relative 1 %   Basophils Absolute 0.1 0.0 - 0.1 K/uL   Immature Granulocytes 1 %   Abs Immature Granulocytes 0.06 0.00 - 0.07  K/uL  Comprehensive metabolic panel  Result Value Ref Range   Sodium 132 (L) 135 - 145 mmol/L   Potassium 4.4 3.5 - 5.1 mmol/L   Chloride 96 (L) 98 - 111 mmol/L   CO2 29 22 - 32 mmol/L   Glucose, Bld 189 (H) 70 - 99 mg/dL   BUN 23 8 - 23 mg/dL   Creatinine, Ser 1.12 0.61 - 1.24 mg/dL   Calcium 9.8 8.9 - 10.3 mg/dL   Total Protein 6.2 (L) 6.5 - 8.1 g/dL   Albumin 2.7 (L) 3.5 - 5.0 g/dL   AST 23 15 - 41 U/L   ALT 17 0 - 44 U/L   Alkaline Phosphatase 55 38 - 126 U/L   Total Bilirubin 0.5 0.3 - 1.2 mg/dL   GFR, Estimated >60 >60 mL/min   Anion gap 7 5 - 15  Magnesium  Result Value Ref Range   Magnesium 2.3 1.7 - 2.4 mg/dL  Phosphorus  Result Value Ref Range   Phosphorus 2.0 (L) 2.5 - 4.6 mg/dL  Glucose, capillary  Result Value Ref Range   Glucose-Capillary 176 (H) 70 - 99 mg/dL  Glucose, capillary  Result Value Ref Range   Glucose-Capillary 153 (H) 70 - 99 mg/dL  Glucose, capillary  Result Value Ref Range   Glucose-Capillary 202 (H) 70 - 99 mg/dL  Glucose, capillary  Result Value Ref Range   Glucose-Capillary 152 (H) 70 - 99 mg/dL  CBC with Differential/Platelet  Result Value Ref Range   WBC 6.6 4.0 - 10.5 K/uL   RBC 4.04 (L) 4.22 - 5.81 MIL/uL   Hemoglobin 12.6 (L) 13.0 - 17.0 g/dL   HCT 36.6 (L) 39 - 52 %   MCV 90.6 80.0 - 100.0 fL   MCH 31.2 26.0 - 34.0 pg   MCHC 34.4 30.0 - 36.0 g/dL   RDW 12.6 11.5 - 15.5 %   Platelets 197 150 - 400 K/uL   nRBC 0.0 0.0 - 0.2 %   Neutrophils Relative % 61 %   Neutro Abs 4.0 1.7 - 7.7 K/uL   Lymphocytes Relative 21 %   Lymphs Abs 1.4 0.7 - 4.0 K/uL   Monocytes Relative 14 %   Monocytes Absolute 1.0 0.1 - 1.0 K/uL   Eosinophils Relative 2 %    Eosinophils Absolute 0.1 0.0 - 0.5 K/uL   Basophils Relative 1 %   Basophils Absolute 0.1 0.0 - 0.1 K/uL   Immature Granulocytes 1 %   Abs Immature Granulocytes 0.08 (H) 0.00 - 0.07 K/uL  Comprehensive metabolic panel  Result Value Ref Range   Sodium 136 135 - 145 mmol/L   Potassium 3.8 3.5 - 5.1 mmol/L   Chloride 101 98 - 111 mmol/L   CO2 29 22 - 32 mmol/L   Glucose, Bld 135 (H) 70 - 99 mg/dL   BUN 14 8 - 23 mg/dL   Creatinine, Ser 0.95 0.61 - 1.24 mg/dL   Calcium 9.4 8.9 - 10.3 mg/dL   Total Protein 6.0 (L) 6.5 - 8.1 g/dL   Albumin 2.7 (L) 3.5 - 5.0 g/dL   AST 28 15 - 41 U/L   ALT 20 0 - 44 U/L   Alkaline Phosphatase 50 38 - 126 U/L   Total Bilirubin 0.5 0.3 - 1.2 mg/dL   GFR, Estimated >60 >60 mL/min   Anion gap 6 5 - 15  Magnesium  Result Value Ref Range   Magnesium 2.2 1.7 - 2.4 mg/dL  Phosphorus  Result Value Ref Range  Phosphorus 2.9 2.5 - 4.6 mg/dL  Glucose, capillary  Result Value Ref Range   Glucose-Capillary 185 (H) 70 - 99 mg/dL  Glucose, capillary  Result Value Ref Range   Glucose-Capillary 141 (H) 70 - 99 mg/dL  Urinalysis, Complete w Microscopic Urine, Clean Catch  Result Value Ref Range   Color, Urine YELLOW (A) YELLOW   APPearance CLEAR (A) CLEAR   Specific Gravity, Urine 1.006 1.005 - 1.030   pH 6.0 5.0 - 8.0   Glucose, UA 50 (A) NEGATIVE mg/dL   Hgb urine dipstick SMALL (A) NEGATIVE   Bilirubin Urine NEGATIVE NEGATIVE   Ketones, ur NEGATIVE NEGATIVE mg/dL   Protein, ur 100 (A) NEGATIVE mg/dL   Nitrite NEGATIVE NEGATIVE   Leukocytes,Ua TRACE (A) NEGATIVE   RBC / HPF 0-5 0 - 5 RBC/hpf   WBC, UA 0-5 0 - 5 WBC/hpf   Bacteria, UA NONE SEEN NONE SEEN   Squamous Epithelial / LPF NONE SEEN 0 - 5  Glucose, capillary  Result Value Ref Range   Glucose-Capillary 174 (H) 70 - 99 mg/dL  Glucose, capillary  Result Value Ref Range   Glucose-Capillary 183 (H) 70 - 99 mg/dL  CBC with Differential/Platelet  Result Value Ref Range   WBC 8.5 4.0 - 10.5 K/uL    RBC 4.59 4.22 - 5.81 MIL/uL   Hemoglobin 14.2 13.0 - 17.0 g/dL   HCT 41.5 39 - 52 %   MCV 90.4 80.0 - 100.0 fL   MCH 30.9 26.0 - 34.0 pg   MCHC 34.2 30.0 - 36.0 g/dL   RDW 12.6 11.5 - 15.5 %   Platelets 232 150 - 400 K/uL   nRBC 0.0 0.0 - 0.2 %   Neutrophils Relative % 64 %   Neutro Abs 5.5 1.7 - 7.7 K/uL   Lymphocytes Relative 21 %   Lymphs Abs 1.7 0.7 - 4.0 K/uL   Monocytes Relative 10 %   Monocytes Absolute 0.9 0.1 - 1.0 K/uL   Eosinophils Relative 2 %   Eosinophils Absolute 0.2 0.0 - 0.5 K/uL   Basophils Relative 1 %   Basophils Absolute 0.1 0.0 - 0.1 K/uL   Immature Granulocytes 2 %   Abs Immature Granulocytes 0.14 (H) 0.00 - 0.07 K/uL  Comprehensive metabolic panel  Result Value Ref Range   Sodium 136 135 - 145 mmol/L   Potassium 3.9 3.5 - 5.1 mmol/L   Chloride 99 98 - 111 mmol/L   CO2 29 22 - 32 mmol/L   Glucose, Bld 177 (H) 70 - 99 mg/dL   BUN 14 8 - 23 mg/dL   Creatinine, Ser 1.06 0.61 - 1.24 mg/dL   Calcium 10.1 8.9 - 10.3 mg/dL   Total Protein 7.1 6.5 - 8.1 g/dL   Albumin 3.0 (L) 3.5 - 5.0 g/dL   AST 35 15 - 41 U/L   ALT 26 0 - 44 U/L   Alkaline Phosphatase 54 38 - 126 U/L   Total Bilirubin 0.5 0.3 - 1.2 mg/dL   GFR, Estimated >60 >60 mL/min   Anion gap 8 5 - 15  Magnesium  Result Value Ref Range   Magnesium 2.2 1.7 - 2.4 mg/dL  Phosphorus  Result Value Ref Range   Phosphorus 2.8 2.5 - 4.6 mg/dL  Glucose, capillary  Result Value Ref Range   Glucose-Capillary 181 (H) 70 - 99 mg/dL  Glucose, capillary  Result Value Ref Range   Glucose-Capillary 150 (H) 70 - 99 mg/dL   Comment 1 Notify RN  Glucose, capillary  Result Value Ref Range   Glucose-Capillary 171 (H) 70 - 99 mg/dL   Comment 1 Notify RN   CBG monitoring, ED  Result Value Ref Range   Glucose-Capillary 109 (H) 70 - 99 mg/dL  CBG monitoring, ED  Result Value Ref Range   Glucose-Capillary 188 (H) 70 - 99 mg/dL  ECHOCARDIOGRAM LIMITED  Result Value Ref Range   Weight 4,652.8 oz   Height  71 in   BP 127/69 mmHg   S' Lateral 2.89 cm  Troponin I (High Sensitivity)  Result Value Ref Range   Troponin I (High Sensitivity) 387 (HH) <18 ng/L  Troponin I (High Sensitivity)  Result Value Ref Range   Troponin I (High Sensitivity) 355 (HH) <18 ng/L  Troponin I (High Sensitivity)  Result Value Ref Range   Troponin I (High Sensitivity) 144 (HH) <18 ng/L      Assessment & Plan:   Problem List Items Addressed This Visit      Cardiovascular and Mediastinum   Hypertension associated with diabetes (HCC)    Chronic, ongoing with BP at goal on check today.  Educated him on goal of <130/90.  Has underlying circulation issues and have recommended he wear compression hose at home + decrease sodium intake.  Continue current medication regimen + continue collaboration with cardiology and [place] PCP.  Recommend he bring all medications to his provider visits and document all BP and BS readings, which should bring with him -- as is poor historian and unsure what he is exactly taking daily at home.  Reviewed medications exensively with him today -- he sees multiple specialists and [place] providers -- discussed with him that this can complicate matters and he should always bring medications with him to all visits.  CBC, CMP, TSH today.  Return in 3 months for visit with new physician in office.  Sooner if worsening symptoms.      Relevant Orders   TSH   Comprehensive metabolic panel     Respiratory   COPD (chronic obstructive pulmonary disease) (HCC)    Chronic, ongoing.  Followed by pulmonary.  Continue current medication regimen as prescribed by them and adjust as needed -- discussed need to take Qvar BID as is ordered.  Continue Mucinex at home as needed.          Endocrine   Type 2 diabetes mellitus with proteinuria (HCC)    Chronic, ongoing with poor control.  Followed by endocrinology with A1C 7.5% today, January urine ALB 150 and A:C >300, continue Olmesartan for kidney protection.  Continue  current collaboration endocrinology and medication regimen as prescribed by them.  Recommend focus on modest weight loss and diet changes.  Reviewed endocrinology notes.  Continue collaboration with CCM team to assist in ongoing education about diabetes.  Return in 3 months for visit with new physician in office.      Relevant Orders   CBC with Differential/Platelet   Hyperlipidemia associated with type 2 diabetes mellitus (HCC)    Chronic, ongoing.  Continue current medication regimen and adjust as needed.  Lipid panel today, recent LDL 31.      Relevant Orders   Lipid Panel w/o Chol/HDL Ratio     Genitourinary   BPH (benign prostatic hyperplasia)    Chronic, ongoing, unsure he is consistently taking Flomax and is having more symptoms.  Have recommend he start taking this consistently daily.  Check PSA today.  If consistent symptoms while taking medication daily, then consider urology referral.  Return in 4 weeks for follow-up.      Relevant Orders   UA/M w/rflx Culture, Routine   PSA   Microscopic hematuria    Noted on UA today 2+ BLD and 3+ PRT, had foley catheter in hospital, some possible trauma with this as he reports it "hurt a lot" going in and that they "had difficulty".  Unsure he is consistently taking Tamsulosin.  Have educated him on this and importance of taking daily.  Will plan to recheck next visit.  Recommend he increase water intake at home.        Other   BMI 40.0-44.9, adult ([place])    Refer to morbid obesity plan, continue focus on healthy diet and exercise regimen.      Morbid obesity ([place])    With T2DM and COPD. Recommended eating smaller high protein, low fat meals more frequently and exercising 30 mins a day 5 times a week with a goal of 10-15lb weight loss in the next 3 months. Patient voiced their understanding and motivation to adhere to these recommendations.       Tremor    Chronic, ongoing, followed by neurology.  Continue this collaboration and  medication regimen.  Return in 3 months.      Sepsis ([place]) - Primary    Acute and improving at this time, but ongoing decompensation from recent hospitalization. Continue to work with home health and physical therapy.  Will work on getting walker for patient.      Poor mobility    Due to chronic illness and decompensation.  Continue to work with home health and physical therapy.  Will order a walker for him to assist with mobility and prevent falls.         Time: 30 minutes, >50% spent counseling/or care coordination   Follow up plan: Return in about 4 weeks (around 03/01/2020) for BPH, T2DM, HTN/HLD.

## 2020-02-02 NOTE — Assessment & Plan Note (Signed)
Refer to morbid obesity plan, continue focus on healthy diet and exercise regimen.

## 2020-02-03 LAB — CBC WITH DIFFERENTIAL/PLATELET
Basophils Absolute: 0.1 10*3/uL (ref 0.0–0.2)
Basos: 1 %
EOS (ABSOLUTE): 0.1 10*3/uL (ref 0.0–0.4)
Eos: 1 %
Hematocrit: 43.4 % (ref 37.5–51.0)
Hemoglobin: 15 g/dL (ref 13.0–17.7)
Immature Grans (Abs): 0.1 10*3/uL (ref 0.0–0.1)
Immature Granulocytes: 1 %
Lymphocytes Absolute: 1.7 10*3/uL (ref 0.7–3.1)
Lymphs: 18 %
MCH: 31 pg (ref 26.6–33.0)
MCHC: 34.6 g/dL (ref 31.5–35.7)
MCV: 90 fL (ref 79–97)
Monocytes Absolute: 0.6 10*3/uL (ref 0.1–0.9)
Monocytes: 7 %
Neutrophils Absolute: 6.6 10*3/uL (ref 1.4–7.0)
Neutrophils: 72 %
Platelets: 347 10*3/uL (ref 150–450)
RBC: 4.84 x10E6/uL (ref 4.14–5.80)
RDW: 12.9 % (ref 11.6–15.4)
WBC: 9.2 10*3/uL (ref 3.4–10.8)

## 2020-02-03 LAB — COMPREHENSIVE METABOLIC PANEL
ALT: 20 IU/L (ref 0–44)
AST: 15 IU/L (ref 0–40)
Albumin/Globulin Ratio: 1.2 (ref 1.2–2.2)
Albumin: 3.7 g/dL (ref 3.7–4.7)
Alkaline Phosphatase: 70 IU/L (ref 44–121)
BUN/Creatinine Ratio: 7 — ABNORMAL LOW (ref 10–24)
BUN: 6 mg/dL — ABNORMAL LOW (ref 8–27)
Bilirubin Total: 0.5 mg/dL (ref 0.0–1.2)
CO2: 24 mmol/L (ref 20–29)
Calcium: 10.4 mg/dL — ABNORMAL HIGH (ref 8.6–10.2)
Chloride: 101 mmol/L (ref 96–106)
Creatinine, Ser: 0.89 mg/dL (ref 0.76–1.27)
GFR calc Af Amer: 93 mL/min/{1.73_m2} (ref >59)
GFR calc non Af Amer: 81 mL/min/{1.73_m2} (ref >59)
Globulin, Total: 3.1 g/dL (ref 1.5–4.5)
Glucose: 166 mg/dL — ABNORMAL HIGH (ref 65–99)
Potassium: 3.8 mmol/L (ref 3.5–5.2)
Sodium: 136 mmol/L (ref 134–144)
Total Protein: 6.8 g/dL (ref 6.0–8.5)

## 2020-02-03 LAB — PSA: Prostate Specific Ag, Serum: 4.6 ng/mL — ABNORMAL HIGH (ref 0.0–4.0)

## 2020-02-03 LAB — LIPID PANEL W/O CHOL/HDL RATIO
Cholesterol, Total: 103 mg/dL (ref 100–199)
HDL: 26 mg/dL — ABNORMAL LOW (ref >39)
LDL Chol Calc (NIH): 48 mg/dL (ref 0–99)
Triglycerides: 171 mg/dL — ABNORMAL HIGH (ref 0–149)
VLDL Cholesterol Cal: 29 mg/dL (ref 5–40)

## 2020-02-03 LAB — TSH: TSH: 4.69 u[IU]/mL — ABNORMAL HIGH (ref 0.450–4.500)

## 2020-02-04 NOTE — Chronic Care Management (AMB) (Deleted)
Chronic Care Management Pharmacy  Name: Justin Griffith  MRN: [id number] DOB: Sep 09, 1940   Chief Complaint/ HPI  Justin Griffith,  79 y.o. , male presents for their Follow-Up CCM visit with the clinical pharmacist via telephone due to COVID-19 Pandemic.Previously seen by [facility] Pharmacist  PCP : Guadalupe Maple, MD Patient Care Team: Guadalupe Maple, MD as PCP - General (Family Medicine) Guadalupe Maple, MD (Family Medicine) Bary Castilla, Forest Gleason, MD (General Surgery) Carloyn Manner, MD as Referring Physician (Otolaryngology) Delana Meyer, Dolores Lory, MD (Vascular Surgery) Abisogun, Domenica Reamer, MD as Consulting Physician (Internal Medicine) Vanita Ingles, RN as Registered Nurse ([place]) Vladimir Faster, [facility] as Pharmacist (Pharmacist)  Their chronic conditions include: Hypertension, Hyperlipidemia, Diabetes, COPD, BPH and PVD   Office Visits: 02/02/20- Justin-labs, hsop follow up, psa/TSH elevated 01/12/20- Justin- Furosemide to 40 mg  01/01/20- Justin- blood work, lipid panel, BP 138/82, start donepezil 5 mg 11/28/19- Justin- Hep C=neg, dc hydralazine 11/06/19- Justin- hydralazine 10 mg bid d/c clonidine Consult Visit: 01/23/20-[facility] ED- inpatient 11/3-11/8/21, not driving? X 6 monsths, HSV PCR from LP 01/03/20- [name], Endocrinology-take metformin, rosuvastatin 9/23-9/26 [facility] inpatient- AMS, seizure-like activity, fall, BP 221/100, hctz d/c, acute punctate infarcts asa325 mg  11/13/19- A. Stepp, NP , Neurology - benign essential tremor, primidone started , PT referral, EMG of LE 8/23/21[facility] ED - migraine, BP 205/110, fioricet given 10/04/19- Dr. Carlyle Basques- Decrease carvedilol to 12.5mg  bid, compression stockings and elevation for edema 7/7/212 -[name], Endocrinology - No changes based on pt report of BG  Allergies  Allergen Reactions  . Farxiga [Dapagliflozin] Rash  . Jardiance [Empagliflozin] Rash    Medications: Outpatient Encounter  Medications as of 02/05/2020  Medication Sig Note  . acyclovir (ZOVIRAX) 400 MG tablet Take 400 mg by mouth 2 (two) times daily.   Marland Kitchen aspirin EC 325 MG EC tablet Take 1 tablet (325 mg total) by mouth daily.   Marland Kitchen azelastine (ASTELIN) 0.1 % nasal spray Place 2 sprays into both nostrils 2 (two) times daily.    . carvedilol (COREG) 12.5 MG tablet Take 2 tablets (25 mg total) by mouth 2 (two) times daily with a meal.   . clobetasol cream (TEMOVATE) 7.98 % Apply 1 application topically 2 (two) times daily. Apply to bilateral lower legs.   . cloNIDine (CATAPRES) 0.2 MG tablet Take 0.2 mg by mouth 2 (two) times daily.   Marland Kitchen donepezil (ARICEPT) 5 MG tablet Take 1 tablet (5 mg total) by mouth at bedtime.   . furosemide (LASIX) 20 MG tablet Take 1 tablet (20 mg total) by mouth daily.   . insulin glargine (LANTUS) 100 UNIT/ML injection Inject 0.25 mLs (25 Units total) into the skin daily.   Marland Kitchen ipratropium-albuterol (DUONEB) 0.5-2.5 (3) MG/3ML SOLN Take 3 mLs by nebulization every 6 (six) hours. 02/21/2019: Taking QID   . metFORMIN (GLUCOPHAGE) 1000 MG tablet Take 1,000 mg by mouth 2 (two) times daily with a meal.   . mupirocin ointment (BACTROBAN) 2 % Apply 1 application topically 2 (two) times daily. To open blisters bilateral legs.   Marland Kitchen olmesartan (BENICAR) 40 MG tablet Take 1 tablet (40 mg total) by mouth daily.   . primidone (MYSOLINE) 50 MG tablet Take 1 tablet (50 mg total) by mouth 2 (two) times daily.   Marland Kitchen QVAR REDIHALER 40 MCG/ACT inhaler 1 puff 2 (two) times daily.  05/23/2019: Using PRN  . rosuvastatin (CRESTOR) 40 MG tablet Take 1 tablet (40 mg total) by mouth at  bedtime. (Patient taking differently: Take 40 mg by mouth at bedtime. 20 mg once a day)   . tamsulosin (FLOMAX) 0.4 MG CAPS capsule Take 0.4 mg by mouth daily. Patient restarted on his own due to trouble urinating   . vitamin B-12 (CYANOCOBALAMIN) 500 MCG tablet Take 1,000 mcg by mouth daily. Two at night   . Vitamin D, Cholecalciferol, 25 MCG  (1000 UT) CAPS Take 50 mcg by mouth daily. 2 a night    No facility-administered encounter medications on file as of 02/05/2020.     Current Diagnosis/Assessment:    Goals Addressed   None     Diabetes   Recent Relevant Labs: Lab Results  Component Value Date/Time   HGBA1C 7.5 (H) 12/15/2019 05:03 AM   HGBA1C 7.7 01/11/2018 12:00 AM   HGBA1C 6.9 02/02/2017 08:23 AM   HGBA1C 9.1 (H) 05/01/2015 09:43 AM   MICROALBUR 150 (H) 03/28/2019 11:33 AM   MICROALBUR 30 (H) 01/30/2016 08:24 AM     Checking BG: 2x per Day  Recent FBG Readings:149, 160, 180 Recent pre-meal BG readings: unknown Recent 2hr PP BG readings:  140? Recent HS BG readings: unknown Patient has failed these meds in past: Jardiance, Farxiga, Victoza, Trulicity Humalog, Toujeo Patient is currently uncontrolled on the following medications:   Metformin 1000 mg bid  Lantus 60-75 units daily (self decrease from 80 units)  Last diabetic Foot exam:  Lab Results  Component Value Date/Time   HMDIABEYEEXA No Retinopathy 03/05/2016 01:53 PM    Last diabetic Foot  exam:  09/27/19 Endocrinology  . We discussed: diet and exercise extensively and how to recognize and treat signs of hypoglycemia. Discussed limiting starchy foods, portion control, eating consistent meals with lean proteins and vegetables. He endorses "eating what I want but not as much of it" gives an example of having one hot dog instead of 3 or 4.He complains of spells of "losing my balance and my arm flaps around" he also notes he occasionally has a shooting, tingling feel in his leg. All symptoms are on the right side. He denies, falling, blurred vision, slurred speech or loss of consciousness during these episodes. States the [name] is planning to do a test on his leg. He is still taking only 300mg  gabapentin at night.     Plan  Continue current medications  Hypertension   BP goal is:  <130/80  Office blood pressures are  BP Readings from Last 3  Encounters:  02/02/20 138/82  01/29/20 (!) 176/84  01/12/20 140/80   Patient checks BP at home not recently. He reports needing a new meter. Patient home BP readings are ranging: Not taking.  Patient has failed these meds in the past: clonidine, olmesartan/amlodipine/HCTZ Patient is currently uncontrolled on the following medications:  . Carvedilol 12.5 mg bid (decreased dose per Cardiology note 10/04/19 new script not sent)  Lasix 40 mg qd  Olmesartan/HCTZ 40/25mg  qd   We discussed diet and exercise for DM and HTN including reduced carbohydrates and sodium. Patient needs a new BP Monitor and will request at visit with provider scheduled at the [facility] tomorrow. Dr. Shelby Mattocks note from 10/04/19 indicating reduced carvedilol dose to 12.5 mg bid based on low bp and mild bradycardia. Patient will discuss with [place] provider tomorrow.  Plan  Continue current medications .       Medication Management   [facility] [employer] [facility] all medications Uses pill box? Yes Pt endorses 75% compliance    Plan  Continue current medication management strategy. Will ask patient to bring meds to next visit as it difficult to determine what he actually takes given his extensive provider network.    Follow up: three month phone visit  Junita Push. Kenton Kingfisher PharmD, [facility] [phone]

## 2020-02-05 ENCOUNTER — Ambulatory Visit: Payer: Medicare Other

## 2020-02-05 DIAGNOSIS — Z9989 Dependence on other enabling machines and devices: Secondary | ICD-10-CM | POA: Diagnosis not present

## 2020-02-05 DIAGNOSIS — G250 Essential tremor: Secondary | ICD-10-CM | POA: Diagnosis not present

## 2020-02-05 DIAGNOSIS — R262 Difficulty in walking, not elsewhere classified: Secondary | ICD-10-CM | POA: Diagnosis not present

## 2020-02-05 DIAGNOSIS — G4733 Obstructive sleep apnea (adult) (pediatric): Secondary | ICD-10-CM | POA: Diagnosis not present

## 2020-02-05 LAB — URINALYSIS, ROUTINE W REFLEX MICROSCOPIC
Bilirubin, UA: NEGATIVE
Glucose, UA: NEGATIVE
Ketones, UA: NEGATIVE
Leukocytes,UA: NEGATIVE
Nitrite, UA: NEGATIVE
Specific Gravity, UA: 1.02 (ref 1.005–1.030)
Urobilinogen, Ur: 0.2 mg/dL (ref 0.2–1.0)
pH, UA: 6.5 (ref 5.0–7.5)

## 2020-02-05 LAB — MICROSCOPIC EXAMINATION: Bacteria, UA: NONE SEEN

## 2020-02-05 LAB — BAYER DCA HB A1C WAIVED: HB A1C (BAYER DCA - WAIVED): 7.5 % — ABNORMAL HIGH (ref <7.0)

## 2020-02-05 NOTE — Progress Notes (Signed)
Please let Mr. Fonseca know his labs have returned.  His anemia has improved.  Cholesterol levels remain stable with an at goal LDL (bad cholesterol) level. Kidney and liver function are stable.  Prostate lab screening was normal for his age and ethnicity, but slightly elevated from previous, I suspect this is related to possibly missing Tamsulosin doses as we discussed and I want him to ensure he has restarted this for his prostate to help reduce his urinary symptoms.  If any questions let me know.  Have a good day!! Keep being awesome!!  Thank you for allowing me to participate in your care. Kindest regards, Clarkson Rosselli

## 2020-02-05 NOTE — Addendum Note (Signed)
Addended by: Georgina Peer on: 02/05/2020 11:41 AM   Modules accepted: Orders

## 2020-02-06 DIAGNOSIS — E1169 Type 2 diabetes mellitus with other specified complication: Secondary | ICD-10-CM | POA: Diagnosis not present

## 2020-02-06 DIAGNOSIS — E1159 Type 2 diabetes mellitus with other circulatory complications: Secondary | ICD-10-CM | POA: Diagnosis not present

## 2020-02-06 DIAGNOSIS — G250 Essential tremor: Secondary | ICD-10-CM | POA: Diagnosis not present

## 2020-02-06 DIAGNOSIS — R079 Chest pain, unspecified: Secondary | ICD-10-CM | POA: Diagnosis not present

## 2020-02-06 DIAGNOSIS — E669 Obesity, unspecified: Secondary | ICD-10-CM | POA: Diagnosis not present

## 2020-02-06 DIAGNOSIS — R0609 Other forms of dyspnea: Secondary | ICD-10-CM | POA: Diagnosis not present

## 2020-02-06 DIAGNOSIS — I152 Hypertension secondary to endocrine disorders: Secondary | ICD-10-CM | POA: Diagnosis not present

## 2020-02-06 DIAGNOSIS — I10 Essential (primary) hypertension: Secondary | ICD-10-CM | POA: Diagnosis not present

## 2020-02-06 DIAGNOSIS — J449 Chronic obstructive pulmonary disease, unspecified: Secondary | ICD-10-CM | POA: Diagnosis not present

## 2020-02-06 DIAGNOSIS — G4733 Obstructive sleep apnea (adult) (pediatric): Secondary | ICD-10-CM | POA: Diagnosis not present

## 2020-02-06 DIAGNOSIS — E785 Hyperlipidemia, unspecified: Secondary | ICD-10-CM | POA: Diagnosis not present

## 2020-02-06 DIAGNOSIS — I2510 Atherosclerotic heart disease of native coronary artery without angina pectoris: Secondary | ICD-10-CM | POA: Diagnosis not present

## 2020-02-06 DIAGNOSIS — M79604 Pain in right leg: Secondary | ICD-10-CM | POA: Diagnosis not present

## 2020-02-06 DIAGNOSIS — G9341 Metabolic encephalopathy: Secondary | ICD-10-CM | POA: Diagnosis not present

## 2020-02-06 DIAGNOSIS — E119 Type 2 diabetes mellitus without complications: Secondary | ICD-10-CM | POA: Diagnosis not present

## 2020-02-06 DIAGNOSIS — E782 Mixed hyperlipidemia: Secondary | ICD-10-CM | POA: Diagnosis not present

## 2020-02-06 DIAGNOSIS — K5900 Constipation, unspecified: Secondary | ICD-10-CM | POA: Diagnosis not present

## 2020-02-08 DIAGNOSIS — R059 Cough, unspecified: Secondary | ICD-10-CM | POA: Diagnosis not present

## 2020-02-08 DIAGNOSIS — G4733 Obstructive sleep apnea (adult) (pediatric): Secondary | ICD-10-CM | POA: Diagnosis not present

## 2020-02-08 DIAGNOSIS — J90 Pleural effusion, not elsewhere classified: Secondary | ICD-10-CM | POA: Diagnosis not present

## 2020-02-08 DIAGNOSIS — J439 Emphysema, unspecified: Secondary | ICD-10-CM | POA: Diagnosis not present

## 2020-02-08 DIAGNOSIS — R0602 Shortness of breath: Secondary | ICD-10-CM | POA: Diagnosis not present

## 2020-02-08 DIAGNOSIS — I517 Cardiomegaly: Secondary | ICD-10-CM | POA: Diagnosis not present

## 2020-02-09 ENCOUNTER — Telehealth: Payer: Self-pay | Admitting: Nurse Practitioner

## 2020-02-09 DIAGNOSIS — E1169 Type 2 diabetes mellitus with other specified complication: Secondary | ICD-10-CM | POA: Diagnosis not present

## 2020-02-09 DIAGNOSIS — I152 Hypertension secondary to endocrine disorders: Secondary | ICD-10-CM | POA: Diagnosis not present

## 2020-02-09 DIAGNOSIS — E1159 Type 2 diabetes mellitus with other circulatory complications: Secondary | ICD-10-CM | POA: Diagnosis not present

## 2020-02-09 DIAGNOSIS — G9341 Metabolic encephalopathy: Secondary | ICD-10-CM | POA: Diagnosis not present

## 2020-02-09 DIAGNOSIS — E785 Hyperlipidemia, unspecified: Secondary | ICD-10-CM | POA: Diagnosis not present

## 2020-02-09 DIAGNOSIS — J449 Chronic obstructive pulmonary disease, unspecified: Secondary | ICD-10-CM | POA: Diagnosis not present

## 2020-02-09 NOTE — Telephone Encounter (Signed)
Orders written and placed in folder for signatures.

## 2020-02-09 NOTE — Telephone Encounter (Signed)
Orders signed and faxed to [facility]. Called and let patient know that this was done for him.

## 2020-02-09 NOTE — Telephone Encounter (Signed)
Can you assist with these orders for me.  Thank you.:)

## 2020-02-09 NOTE — Telephone Encounter (Signed)
Angie rn with [facility] is calling checking on the order for walker pt also needs large  automatic bp cuff . Please send orders to [facility]. Pt see jolene on 02-02-2020

## 2020-02-12 ENCOUNTER — Telehealth: Payer: Self-pay

## 2020-02-12 NOTE — Telephone Encounter (Signed)
Pam did you tell him you would leave him some, I recall him telling me CCM crew had some for him.  I do not have any.:)

## 2020-02-12 NOTE — Telephone Encounter (Signed)
Copied from [place] [id number]. Topic: General - Inquiry >> Feb 12, 2020  9:24 AM Greggory Keen D wrote: Reason for CRM: Pt called saying [facility] and they told him they are out of walkers and dont know when they will get some in.  He wants to know if jolene can send the order to another medical supply co.  CB#  [phone]  Pt also ask if Jolene left him some support socks and the front desk.  He said she told him she would.

## 2020-02-12 NOTE — Telephone Encounter (Signed)
Order for walker faxed to [facility] for patient.   Nothing left up front for the patient by Jolene.   Called and notified patient that order was resent to [facility] for him. Also let him know that nothing was left up front by [facility] for him. I told him that I would route this message to her in basket to address when she returned to the office.

## 2020-02-14 ENCOUNTER — Telehealth: Payer: Self-pay | Admitting: Pharmacist

## 2020-02-14 ENCOUNTER — Ambulatory Visit: Payer: Medicare Other

## 2020-02-14 ENCOUNTER — Telehealth: Payer: Medicare Other

## 2020-02-14 DIAGNOSIS — E1169 Type 2 diabetes mellitus with other specified complication: Secondary | ICD-10-CM | POA: Diagnosis not present

## 2020-02-14 DIAGNOSIS — E1159 Type 2 diabetes mellitus with other circulatory complications: Secondary | ICD-10-CM | POA: Diagnosis not present

## 2020-02-14 DIAGNOSIS — I152 Hypertension secondary to endocrine disorders: Secondary | ICD-10-CM | POA: Diagnosis not present

## 2020-02-14 DIAGNOSIS — G9341 Metabolic encephalopathy: Secondary | ICD-10-CM | POA: Diagnosis not present

## 2020-02-14 DIAGNOSIS — J449 Chronic obstructive pulmonary disease, unspecified: Secondary | ICD-10-CM | POA: Diagnosis not present

## 2020-02-14 DIAGNOSIS — E785 Hyperlipidemia, unspecified: Secondary | ICD-10-CM | POA: Diagnosis not present

## 2020-02-14 NOTE — Telephone Encounter (Signed)
°  Chronic Care Management   Outreach Note  02/14/2020 Name: Justin Griffith MRN: [id number] DOB: 1941/02/16  Referred by: Guadalupe Maple, MD Reason for referral : Chronic Care Management (follow up)   An unsuccessful telephone outreach was attempted today. The patient was referred to the pharmacist for assistance with care management and care coordination.   Follow Up Plan: Will ask care guide to outreach for reschedule with me.  Junita Push. Kenton Kingfisher PharmD, [facility] [phone]

## 2020-02-14 NOTE — Chronic Care Management (AMB) (Deleted)
  Chronic Care Management   Outreach Note  02/14/2020 Name: MARCELLUS PULLIAM MRN: [id number] DOB: 17-Jul-1940  Referred by: Guadalupe Maple, MD Reason for referral : Chronic Care Management  An unsuccessful telephone outreach was attempted today. The patient was referred to the pharmacist for assistance with care management and care coordination.   Follow Up Plan: Will ask care guide to outreach for reschedule.  Junita Push. Kenton Kingfisher PharmD, [facility] [phone]

## 2020-02-18 DIAGNOSIS — N401 Enlarged prostate with lower urinary tract symptoms: Secondary | ICD-10-CM | POA: Diagnosis not present

## 2020-02-18 DIAGNOSIS — Z85038 Personal history of other malignant neoplasm of large intestine: Secondary | ICD-10-CM | POA: Diagnosis not present

## 2020-02-18 DIAGNOSIS — E1151 Type 2 diabetes mellitus with diabetic peripheral angiopathy without gangrene: Secondary | ICD-10-CM | POA: Diagnosis not present

## 2020-02-18 DIAGNOSIS — G4733 Obstructive sleep apnea (adult) (pediatric): Secondary | ICD-10-CM | POA: Diagnosis not present

## 2020-02-18 DIAGNOSIS — Z8673 Personal history of transient ischemic attack (TIA), and cerebral infarction without residual deficits: Secondary | ICD-10-CM | POA: Diagnosis not present

## 2020-02-18 DIAGNOSIS — G250 Essential tremor: Secondary | ICD-10-CM | POA: Diagnosis not present

## 2020-02-18 DIAGNOSIS — Z6838 Body mass index (BMI) 38.0-38.9, adult: Secondary | ICD-10-CM | POA: Diagnosis not present

## 2020-02-18 DIAGNOSIS — J449 Chronic obstructive pulmonary disease, unspecified: Secondary | ICD-10-CM | POA: Diagnosis not present

## 2020-02-18 DIAGNOSIS — R319 Hematuria, unspecified: Secondary | ICD-10-CM | POA: Diagnosis not present

## 2020-02-18 DIAGNOSIS — Z9181 History of falling: Secondary | ICD-10-CM | POA: Diagnosis not present

## 2020-02-18 DIAGNOSIS — E785 Hyperlipidemia, unspecified: Secondary | ICD-10-CM | POA: Diagnosis not present

## 2020-02-18 DIAGNOSIS — E1159 Type 2 diabetes mellitus with other circulatory complications: Secondary | ICD-10-CM | POA: Diagnosis not present

## 2020-02-18 DIAGNOSIS — E1169 Type 2 diabetes mellitus with other specified complication: Secondary | ICD-10-CM | POA: Diagnosis not present

## 2020-02-18 DIAGNOSIS — Z791 Long term (current) use of non-steroidal anti-inflammatories (NSAID): Secondary | ICD-10-CM | POA: Diagnosis not present

## 2020-02-18 DIAGNOSIS — I152 Hypertension secondary to endocrine disorders: Secondary | ICD-10-CM | POA: Diagnosis not present

## 2020-02-18 DIAGNOSIS — J9610 Chronic respiratory failure, unspecified whether with hypoxia or hypercapnia: Secondary | ICD-10-CM | POA: Diagnosis not present

## 2020-02-18 DIAGNOSIS — R808 Other proteinuria: Secondary | ICD-10-CM | POA: Diagnosis not present

## 2020-02-18 DIAGNOSIS — Z794 Long term (current) use of insulin: Secondary | ICD-10-CM | POA: Diagnosis not present

## 2020-02-19 ENCOUNTER — Telehealth: Payer: Self-pay

## 2020-02-19 NOTE — Telephone Encounter (Signed)
Copied from [facility] [id number]. Topic: Appointment Scheduling - Scheduling Inquiry for Clinic >> Feb 19, 2020 10:20 AM Oneta Rack wrote: Reason for CRM: patient states he was advised to come to the office on Friday 02/16/2020 at 4pm for his pharmacy appointment and he arrived and the door was locked. Patient unsure if his 03/08/2020 case manager appointment is in office or telephone.

## 2020-02-21 ENCOUNTER — Ambulatory Visit (INDEPENDENT_AMBULATORY_CARE_PROVIDER_SITE_OTHER): Payer: Medicare Other | Admitting: Urology

## 2020-02-21 ENCOUNTER — Other Ambulatory Visit: Payer: Self-pay

## 2020-02-21 ENCOUNTER — Encounter: Payer: Self-pay | Admitting: Urology

## 2020-02-21 ENCOUNTER — Telehealth: Payer: Self-pay

## 2020-02-21 VITALS — BP 193/81 | HR 48 | Ht 72.0 in | Wt 289.0 lb

## 2020-02-21 DIAGNOSIS — N401 Enlarged prostate with lower urinary tract symptoms: Secondary | ICD-10-CM

## 2020-02-21 DIAGNOSIS — R310 Gross hematuria: Secondary | ICD-10-CM | POA: Diagnosis not present

## 2020-02-21 DIAGNOSIS — I639 Cerebral infarction, unspecified: Secondary | ICD-10-CM | POA: Diagnosis not present

## 2020-02-21 DIAGNOSIS — R350 Frequency of micturition: Secondary | ICD-10-CM | POA: Diagnosis not present

## 2020-02-21 LAB — BLADDER SCAN AMB NON-IMAGING

## 2020-02-21 NOTE — Chronic Care Management (AMB) (Signed)
  Care Management   Note  02/21/2020 Name: Justin Griffith MRN: [id number] DOB: 1940/10/12  Justin Griffith is a 79 y.o. year old male who is a primary care patient of Crissman, Jeannette How, MD and is actively engaged with the care management team. I reached out to [name] by phone today to assist with re-scheduling a follow up visit with the Pharmacist  Follow up plan: Unsuccessful telephone outreach attempt made. A HIPAA compliant phone message was left for the patient providing contact information and requesting a return call.  The care management team will reach out to the patient again over the next 7 days.  If patient returns call to provider office, please advise to call [facility] [place], [place], [place], [place] [address] Direct Dial: [phone] [email or web address]: [email or web address]

## 2020-02-21 NOTE — Progress Notes (Signed)
02/21/2020 2:06 PM   Justin Griffith [birth year] [id number]  Referring provider: No referring provider defined for this encounter.  Chief Complaint  Patient presents with  . Benign Prostatic Hypertrophy    32mo     HPI: 79 year old male who returns today following recent hospital admission complicated by urinary retention and gross hematuria.  Please see consult note from 01/24/2020.  During this admission, he had altered mental status and sepsis of unknown source.  No evidence of UTI.  He has a significant history of prostamegaly and had Foley catheter trauma the time of catheter placement and subsequently developed some mild gross hematuria which resolved spontaneously.  He ultimately passed a voiding trial and his catheter was removed.  He reports that since being discharged, he said no further issues urinating as well as no further episodes of gross hematuria.  Urinalysis today is negative, no microscopic blood.  He feels like he is able to empty his bladder, has a reasonable urinary stream.  He is never seen a urologist previously.  He does have at least 7+ centimeter prostate with significant prostamegaly on CT scan in the caudal to cranial direction.  He has been on Flomax for a number of years.   IPSS    Row Name 02/21/20 1000         International Prostate Symptom Score   How often have you had the sensation of not emptying your bladder? Not at All     How often have you had to urinate less than every two hours? Almost always     How often have you found you stopped and started again several times when you urinated? Not at All     How often have you found it difficult to postpone urination? Not at All     How often have you had a weak urinary stream? Not at All     How often have you had to strain to start urination? Not at All     How many times did you typically get up at night to urinate? 3 Times     Total IPSS Score 8       Quality of Life due to urinary symptoms    If you were to spend the rest of your life with your urinary condition just the way it is now how would you feel about that? Mostly Disatisfied            Score:  1-7 Mild 8-19 Moderate 20-35 Severe   PMH: Past Medical History:  Diagnosis Date  . Arthritis   . Asthma 2000  . Back pain   . Colon polyp 2012  . COPD (chronic obstructive pulmonary disease) ([place])   . Diabetes mellitus without complication ([place])   . Emphysema of lung ([place])   . Heart murmur   . Hernia 2000  . Hyperlipidemia   . Hypertension   . Personal history of malignant neoplasm of large intestine   . Personal history of tobacco use, presenting hazards to health   . Sleep apnea   . Special screening for malignant neoplasms, colon     Surgical History: Past Surgical History:  Procedure Laterality Date  . CARDIAC CATHETERIZATION Left 09/25/2015   Procedure: Left Heart Cath and Coronary Angiography;  Surgeon: Yolonda Kida, MD;  Location: [facility];  Service: Cardiovascular;  Laterality: Left;  . CHOLECYSTECTOMY  1970  . COLON SURGERY  07-16-99   sigmoid colon resection with primary anastomosis, chemotherapy for metastatic  disease  . COLONOSCOPY  2001, 2012   Dr Bary Castilla, tubular adenoma of the cecum and ascending colon in 2012.  Marland Kitchen COLONOSCOPY WITH PROPOFOL N/A 02/12/2016   Procedure: COLONOSCOPY WITH PROPOFOL;  Surgeon: Robert Bellow, MD;  Location[facility];  Service: Endoscopy;  Laterality: N/A;  . ESOPHAGOGASTRODUODENOSCOPY (EGD) WITH PROPOFOL N/A 02/12/2016   Procedure: ESOPHAGOGASTRODUODENOSCOPY (EGD) WITH PROPOFOL;  Surgeon: Robert Bellow, MD;  Location: [facility] ENDOSCOPY;  Service: Endoscopy;  Laterality: N/A;  . HERNIA REPAIR Right    right inguinal hernia repair  . JOINT REPLACEMENT Bilateral    knee replacement  . KNEE SURGERY Bilateral 2010  . MYRINGOTOMY WITH TUBE PLACEMENT Bilateral 08/16/2014   Procedure: MYRINGOTOMY WITH TUBE PLACEMENT;  Surgeon: Carloyn Manner,  MD;  Location: [facility] ORS;  Service: ENT;  Laterality: Bilateral;  . PERIPHERAL VASCULAR CATHETERIZATION Right 09/04/2015   Procedure: Lower Extremity Angiography;  Surgeon: Katha Cabal, MD;  Location: [facility];  Service: Cardiovascular;  Laterality: Right;  . PERIPHERAL VASCULAR CATHETERIZATION  09/04/2015   Procedure: Lower Extremity Intervention;  Surgeon: Katha Cabal, MD;  Location: [facility];  Service: Cardiovascular;;  . TONSILLECTOMY    . TYMPANOSTOMY TUBE PLACEMENT      Home Medications:  Allergies as of 02/21/2020      Reactions   Farxiga [dapagliflozin] Rash   Jardiance [empagliflozin] Rash      Medication List       Accurate as of February 21, 2020 11:59 PM. If you have any questions, ask your nurse or doctor.        acyclovir 400 MG tablet Commonly known as: ZOVIRAX Take 400 mg by mouth 2 (two) times daily.   aspirin 325 MG EC tablet Take 1 tablet (325 mg total) by mouth daily.   azelastine 0.1 % nasal spray Commonly known as: ASTELIN Place 2 sprays into both nostrils 2 (two) times daily.   carvedilol 12.5 MG tablet Commonly known as: COREG Take 2 tablets (25 mg total) by mouth 2 (two) times daily with a meal.   clobetasol cream 0.05 % Commonly known as: TEMOVATE Apply 1 application topically 2 (two) times daily. Apply to bilateral lower legs.   cloNIDine 0.2 MG tablet Commonly known as: CATAPRES Take 0.2 mg by mouth 2 (two) times daily.   donepezil 5 MG tablet Commonly known as: Aricept Take 1 tablet (5 mg total) by mouth at bedtime.   furosemide 20 MG tablet Commonly known as: LASIX Take 1 tablet (20 mg total) by mouth daily.   insulin glargine 100 UNIT/ML injection Commonly known as: Lantus Inject 0.25 mLs (25 Units total) into the skin daily.   ipratropium-albuterol 0.5-2.5 (3) MG/3ML Soln Commonly known as: DUONEB Take 3 mLs by nebulization every 6 (six) hours.   metFORMIN 1000 MG tablet Commonly known as:  GLUCOPHAGE Take 1,000 mg by mouth 2 (two) times daily with a meal.   mupirocin ointment 2 % Commonly known as: BACTROBAN Apply 1 application topically 2 (two) times daily. To open blisters bilateral legs.   olmesartan 40 MG tablet Commonly known as: BENICAR Take 1 tablet (40 mg total) by mouth daily.   predniSONE 10 MG tablet Commonly known as: DELTASONE Take 10 mg by mouth daily.   primidone 50 MG tablet Commonly known as: MYSOLINE Take 1 tablet (50 mg total) by mouth 2 (two) times daily.   Qvar RediHaler 40 MCG/ACT inhaler Generic drug: beclomethasone 1 puff 2 (two) times daily.   rosuvastatin 40 MG  tablet Commonly known as: CRESTOR Take 1 tablet (40 mg total) by mouth at bedtime. What changed: additional instructions   tamsulosin 0.4 MG Caps capsule Commonly known as: FLOMAX Take 0.4 mg by mouth daily. Patient restarted on his own due to trouble urinating   vitamin B-12 500 MCG tablet Commonly known as: CYANOCOBALAMIN Take 1,000 mcg by mouth daily. Two at night   Vitamin D (Cholecalciferol) 25 MCG (1000 UT) Caps Take 50 mcg by mouth daily. 2 a night       Allergies:  Allergies  Allergen Reactions  . Farxiga [Dapagliflozin] Rash  . Jardiance [Empagliflozin] Rash    Family History: Family History  Problem Relation Age of Onset  . Diabetes Father   . Esophageal cancer Mother   . Alzheimer's disease Paternal Uncle     Social History:  reports that he quit smoking about 30 years ago. His smoking use included cigarettes. He has a 30.00 pack-year smoking history. He has never used smokeless tobacco. He reports that he does not drink alcohol and does not use drugs.   Physical Exam: BP (!) 193/81   Pulse (!) 48   Ht 6' (1.829 m)   Wt 289 lb (131.1 kg)   BMI 39.20 kg/m   Constitutional:  Alert and oriented, No acute distress. HEENT: [place] AT, moist mucus membranes.  Trachea midline, no masses. Cardiovascular: No clubbing, cyanosis, or edema. Respiratory:  Normal respiratory effort, no increased work of breathing. Skin: No rashes, bruises or suspicious lesions. Neurologic: Grossly intact, no focal deficits, moving all 4 extremities. Psychiatric: Normal mood and affect.  Laboratory Data: Lab Results  Component Value Date   WBC 9.2 02/02/2020   HGB 15.0 02/02/2020   HCT 43.4 02/02/2020   MCV 90 02/02/2020   PLT 347 02/02/2020    Lab Results  Component Value Date   CREATININE 0.89 02/02/2020    Lab Results  Component Value Date   HGBA1C 7.5 (H) 02/02/2020    Results for orders placed or performed in visit on 02/21/20  Microscopic Examination   Urine  Result Value Ref Range   WBC, UA 0-5 0 - 5 /hpf   RBC 0-2 0 - 2 /hpf   Epithelial Cells (non renal) 0-10 0 - 10 /hpf   Bacteria, UA None seen None seen/Few  Urinalysis, Complete  Result Value Ref Range   Specific Gravity, UA 1.015 1.005 - 1.030   pH, UA 6.5 5.0 - 7.5   Color, UA Yellow Yellow   Appearance Ur Clear Clear   Leukocytes,UA Negative Negative   Protein,UA 2+ (A) Negative/Trace   Glucose, UA Negative Negative   Ketones, UA Negative Negative   RBC, UA Trace (A) Negative   Bilirubin, UA Negative Negative   Urobilinogen, Ur 0.2 0.2 - 1.0 mg/dL   Nitrite, UA Negative Negative   Microscopic Examination See below:   BLADDER SCAN AMB NON-IMAGING  Result Value Ref Range   Scan Result 73ml      Assessment & Plan:    1. Benign prostatic hyperplasia with urinary frequency Personal history of urinary frequency in setting of known BPH/prostamegaly which he reports is exacerbated by medications  We is not taking his medications, he has no urinary complaints.  He is emptying adequately and his IPSS is low today.  As such, we discussed continued watchful waiting and continuation of Flomax.  He is agreeable this plan.  If his symptoms worsen, could consider the addition of finasteride and/or intervention. - Urinalysis, Complete - BLADDER SCAN  AMB NON-IMAGING  2.  Gross hematuria Documented history of Foley catheter trauma in the setting of significant prostamegaly.  His urinalysis today is reassuringly negative without any evidence of ongoing gross or microscopic hematuria  At this point, it seems reasonable to defer cystoscopy.  I did recheck his urine on 1 additional occasion to ensure that he does not have any microscopic hematuria moving forward.  He is agreeable with this plan.  No follow-ups on file.  Hollice Espy, MD  [facility] [address], [place], [place] [address] [phone]

## 2020-02-22 DIAGNOSIS — R808 Other proteinuria: Secondary | ICD-10-CM | POA: Diagnosis not present

## 2020-02-22 DIAGNOSIS — E1159 Type 2 diabetes mellitus with other circulatory complications: Secondary | ICD-10-CM | POA: Diagnosis not present

## 2020-02-22 DIAGNOSIS — J449 Chronic obstructive pulmonary disease, unspecified: Secondary | ICD-10-CM | POA: Diagnosis not present

## 2020-02-22 DIAGNOSIS — E785 Hyperlipidemia, unspecified: Secondary | ICD-10-CM | POA: Diagnosis not present

## 2020-02-22 DIAGNOSIS — E1169 Type 2 diabetes mellitus with other specified complication: Secondary | ICD-10-CM | POA: Diagnosis not present

## 2020-02-22 DIAGNOSIS — I152 Hypertension secondary to endocrine disorders: Secondary | ICD-10-CM | POA: Diagnosis not present

## 2020-02-23 LAB — URINALYSIS, COMPLETE
Bilirubin, UA: NEGATIVE
Glucose, UA: NEGATIVE
Ketones, UA: NEGATIVE
Leukocytes,UA: NEGATIVE
Nitrite, UA: NEGATIVE
Specific Gravity, UA: 1.015 (ref 1.005–1.030)
Urobilinogen, Ur: 0.2 mg/dL (ref 0.2–1.0)
pH, UA: 6.5 (ref 5.0–7.5)

## 2020-02-23 LAB — MICROSCOPIC EXAMINATION: Bacteria, UA: NONE SEEN

## 2020-02-24 DIAGNOSIS — R808 Other proteinuria: Secondary | ICD-10-CM | POA: Diagnosis not present

## 2020-02-24 DIAGNOSIS — I152 Hypertension secondary to endocrine disorders: Secondary | ICD-10-CM | POA: Diagnosis not present

## 2020-02-24 DIAGNOSIS — E785 Hyperlipidemia, unspecified: Secondary | ICD-10-CM | POA: Diagnosis not present

## 2020-02-24 DIAGNOSIS — E1159 Type 2 diabetes mellitus with other circulatory complications: Secondary | ICD-10-CM | POA: Diagnosis not present

## 2020-02-24 DIAGNOSIS — J449 Chronic obstructive pulmonary disease, unspecified: Secondary | ICD-10-CM | POA: Diagnosis not present

## 2020-02-24 DIAGNOSIS — E1169 Type 2 diabetes mellitus with other specified complication: Secondary | ICD-10-CM | POA: Diagnosis not present

## 2020-02-25 ENCOUNTER — Encounter: Payer: Self-pay | Admitting: Nurse Practitioner

## 2020-02-25 DIAGNOSIS — R7989 Other specified abnormal findings of blood chemistry: Secondary | ICD-10-CM | POA: Insufficient documentation

## 2020-02-27 ENCOUNTER — Encounter: Payer: Self-pay | Admitting: Nurse Practitioner

## 2020-02-27 ENCOUNTER — Ambulatory Visit (INDEPENDENT_AMBULATORY_CARE_PROVIDER_SITE_OTHER): Payer: Medicare Other | Admitting: Nurse Practitioner

## 2020-02-27 ENCOUNTER — Other Ambulatory Visit: Payer: Self-pay

## 2020-02-27 VITALS — BP 138/68 | HR 64 | Temp 97.6°F | Ht 69.69 in | Wt 294.0 lb

## 2020-02-27 DIAGNOSIS — R251 Tremor, unspecified: Secondary | ICD-10-CM

## 2020-02-27 DIAGNOSIS — E039 Hypothyroidism, unspecified: Secondary | ICD-10-CM | POA: Diagnosis not present

## 2020-02-27 DIAGNOSIS — J449 Chronic obstructive pulmonary disease, unspecified: Secondary | ICD-10-CM

## 2020-02-27 DIAGNOSIS — E785 Hyperlipidemia, unspecified: Secondary | ICD-10-CM | POA: Diagnosis not present

## 2020-02-27 DIAGNOSIS — E1169 Type 2 diabetes mellitus with other specified complication: Secondary | ICD-10-CM | POA: Diagnosis not present

## 2020-02-27 DIAGNOSIS — E1129 Type 2 diabetes mellitus with other diabetic kidney complication: Secondary | ICD-10-CM

## 2020-02-27 DIAGNOSIS — R809 Proteinuria, unspecified: Secondary | ICD-10-CM | POA: Diagnosis not present

## 2020-02-27 DIAGNOSIS — I152 Hypertension secondary to endocrine disorders: Secondary | ICD-10-CM | POA: Diagnosis not present

## 2020-02-27 DIAGNOSIS — R7989 Other specified abnormal findings of blood chemistry: Secondary | ICD-10-CM

## 2020-02-27 DIAGNOSIS — I639 Cerebral infarction, unspecified: Secondary | ICD-10-CM

## 2020-02-27 DIAGNOSIS — Z6841 Body Mass Index (BMI) 40.0 and over, adult: Secondary | ICD-10-CM | POA: Diagnosis not present

## 2020-02-27 DIAGNOSIS — I739 Peripheral vascular disease, unspecified: Secondary | ICD-10-CM

## 2020-02-27 DIAGNOSIS — E1159 Type 2 diabetes mellitus with other circulatory complications: Secondary | ICD-10-CM | POA: Diagnosis not present

## 2020-02-27 MED ORDER — CLONIDINE HCL 0.2 MG PO TABS
0.2000 mg | ORAL_TABLET | Freq: Two times a day (BID) | ORAL | 4 refills | Status: DC
Start: 2020-02-27 — End: 2020-04-03

## 2020-02-27 NOTE — Assessment & Plan Note (Signed)
Chronic, ongoing.  Continue current medication regimen and adjust as needed.  Lipid panel next visit.    

## 2020-02-27 NOTE — Assessment & Plan Note (Signed)
With T2DM and COPD. Recommended eating smaller high protein, low fat meals more frequently and exercising 30 mins a day 5 times a week with a goal of 10-15lb weight loss in the next 3 months. Patient voiced their understanding and motivation to adhere to these recommendations.  

## 2020-02-27 NOTE — Assessment & Plan Note (Signed)
Chronic, ongoing.  Followed by pulmonary.  Continue current medication regimen as prescribed by them and adjust as needed -- discussed need to take Qvar BID as is ordered.  Continue Mucinex at home as needed.

## 2020-02-27 NOTE — Patient Instructions (Addendum)
Primidone 100 MG BID -- recent increase by neurology in November  ASK [place] IN November.  Diabetic Neuropathy Diabetic neuropathy refers to nerve damage that is caused by diabetes (diabetes mellitus). Over time, people with diabetes can develop nerve damage throughout the body. There are several types of diabetic neuropathy:  Peripheral neuropathy. This is the most common type of diabetic neuropathy. It causes damage to nerves that carry signals between the spinal cord and other parts of the body (peripheral nerves). This usually affects nerves in the feet and legs first, and may eventually affect the hands and arms. The damage affects the ability to sense touch or temperature.  Autonomic neuropathy. This type causes damage to nerves that control involuntary functions (autonomic nerves). These nerves carry signals that control: ? Heartbeat. ? Body temperature. ? Blood pressure. ? Urination. ? Digestion. ? Sweating. ? Sexual function. ? Response to changing blood sugar (glucose) levels.  Focal neuropathy. This type of nerve damage affects one area of the body, such as an arm, a leg, or the face. The injury may involve one nerve or a small group of nerves. Focal neuropathy can be painful and unpredictable, and occurs most often in older adults with diabetes. This often develops suddenly, but usually improves over time and does not cause long-term problems.  Proximal neuropathy. This type of nerve damage affects the nerves of the thighs, hips, buttocks, or legs. It causes severe pain, weakness, and muscle death (atrophy), usually in the thigh muscles. It is more common among older men and people who have type 2 diabetes. The length of recovery time may vary. What are the causes? Peripheral, autonomic, and focal neuropathies are caused by diabetes that is not well controlled with treatment. The cause of proximal neuropathy is not known, but it  may be caused by inflammation related to uncontrolled blood glucose levels. What are the signs or symptoms? Peripheral neuropathy Peripheral neuropathy develops slowly over time. When the nerves of the feet and legs no longer work, you may experience:  Burning, stabbing, or aching pain in the legs or feet.  Pain or cramping in the legs or feet.  Loss of feeling (numbness) and inability to feel pressure or pain in the feet. This can lead to: ? Thick calluses or sores on areas of constant pressure. ? Ulcers. ? Reduced ability to feel temperature changes.  Foot deformities.  Muscle weakness.  Loss of balance or coordination. Autonomic neuropathy The symptoms of autonomic neuropathy vary depending on which nerves are affected. Symptoms may include:  Problems with digestion, such as: ? Nausea or vomiting. ? Poor appetite. ? Bloating. ? Diarrhea or constipation. ? Trouble swallowing. ? Losing weight without trying to.  Problems with the heart, blood and lungs, such as: ? Dizziness, especially when standing up. ? Fainting. ? Shortness of breath. ? Irregular heartbeat.  Bladder problems, such as: ? Trouble starting or stopping urination. ? Leaking urine. ? Trouble emptying the bladder. ? Urinary tract infections (UTIs).  Problems with other body functions, such as: ? Sweat. You may sweat too much or too little. ? Temperature. You might get hot easily. Or, you might feel cold more than usual. ? Sexual function. Men may not be able to get or maintain an erection. Women may have vaginal dryness and difficulty with arousal. Focal neuropathy Symptoms affect only one area of the body. Common symptoms include:  Numbness.  Tingling.  Burning pain.  Prickling feeling.  Very sensitive  skin.  Weakness.  Inability to move (paralysis).  Muscle twitching.  Muscles getting smaller (wasting).  Poor coordination.  Double or blurred vision. Proximal neuropathy  Sudden,  severe pain in the hip, thigh, or buttocks. Pain may spread from the back into the legs (sciatica).  Pain and numbness in the arms and legs.  Tingling.  Loss of bladder control or bowel control.  Weakness and wasting of thigh muscles.  Difficulty getting up from a seated position.  Abdominal swelling.  Unexplained weight loss. How is this diagnosed? Diagnosis usually involves reviewing your medical history and any symptoms you have. Diagnosis varies depending on the type of neuropathy your health care provider suspects. Peripheral neuropathy Your health care provider will check areas that are affected by your nervous system (neurologic exam), such as your reflexes, how you move, and what you can feel. You may have other tests, such as:  Blood tests.  Removal and examination of fluid that surrounds the spinal cord (lumbar puncture).  CT scan.  MRI.  A test to check the nerves that control muscles (electromyogram, EMG).  Tests of how quickly messages pass through your nerves (nerve conduction velocity tests).  Removal of a small piece of nerve to be examined under a microscope (biopsy). Autonomic neuropathy You may have tests, such as:  Tests to measure your blood pressure and heart rate. This may include monitoring you while you are safely secured to an exam table that moves you from a lying position to an upright position (table tilt test).  Breathing tests to check your lungs.  Tests to check how food moves through the digestive system (gastric emptying tests).  Blood, sweat, or urine tests.  Ultrasound of your bladder.  Spinal fluid tests. Focal neuropathy This condition may be diagnosed with:  A neurologic exam.  CT scan.  MRI.  EMG.  Nerve conduction velocity tests. Proximal neuropathy There is no test to diagnose this type of neuropathy. You may have tests to rule out other possible causes of this type of neuropathy. Tests may include:  X-rays of  your spine and lumbar region.  Lumbar puncture.  MRI. How is this treated? The goal of treatment is to keep nerve damage from getting worse. The most important part of treatment is keeping your blood glucose level and your A1C level within your target range by following your diabetes management plan. Over time, maintaining lower blood glucose levels helps lessen symptoms. In some cases, you may need prescription pain medicine. Follow these instructions at home:  Lifestyle   Do not use any products that contain nicotine or tobacco, such as cigarettes and e-cigarettes. If you need help quitting, ask your health care provider.  Be physically active every day. Include strength training and balance exercises.  Follow a healthy meal plan.  Work with your health care provider to manage your blood pressure. General instructions  Follow your diabetes management plan as directed. ? Check your blood glucose levels as directed by your health care provider. ? Keep your blood glucose in your target range as directed by your health care provider. ? Have your A1C level checked at least two times a year, or as often as told by your health care provider.  Take over the counter and prescription medicines only as told by your health care provider. This includes insulin and diabetes medicine.  Do not drive or use heavy machinery while taking prescription pain medicines.  Check your skin and feet every day for cuts, bruises, redness, blisters,  or sores.  Keep all follow up visits as told by your health care provider. This is important. Contact a health care provider if:  You have burning, stabbing, or aching pain in your legs or feet.  You are unable to feel pressure or pain in your feet.  You develop problems with digestion, such as: ? Nausea. ? Vomiting. ? Bloating. ? Constipation. ? Diarrhea. ? Abdominal pain.  You have difficulty with urination, such as inability: ? To control when you  urinate (incontinence). ? To completely empty the bladder (retention).  You have palpitations.  You feel dizzy, weak, or faint when you stand up. Get help right away if:  You cannot urinate.  You have sudden weakness or loss of coordination.  You have trouble speaking.  You have pain or pressure in your chest.  You have an irregular heart beat.  You have sudden inability to move a part of your body. Summary  Diabetic neuropathy refers to nerve damage that is caused by diabetes. It can affect nerves throughout the entire body, causing numbness and pain in the arms, legs, digestive tract, heart, and other body systems.  Keep your blood glucose level and your blood pressure in your target range, as directed by your health care provider. This can help prevent neuropathy from getting worse.  Check your skin and feet every day for cuts, bruises, redness, blisters, or sores.  Do not use any products that contain nicotine or tobacco, such as cigarettes and e-cigarettes. If you need help quitting, ask your health care provider. This information is not intended to replace advice given to you by your health care provider. Make sure you discuss any questions you have with your health care provider. Document Revised: 04/21/2017 Document Reviewed: 04/13/2016 Elsevier Patient Education  2020 [employer].

## 2020-02-27 NOTE — Assessment & Plan Note (Signed)
Chronic, ongoing with BP elevated initial, but improved on recheck.  Educated him on goal of <130/90.  Has underlying circulation issues and have recommended he wear compression hose at home + decrease sodium intake.  Continue current medication regimen + continue collaboration with cardiology and [facility] PCP.  Recommend he bring all medications to his provider visits and document all BP and BS readings, which should bring with him -- as is poor historian and unsure what he is exactly taking daily at home.  Reviewed medications exensively with him today -- he sees multiple specialists and [facility] providers -- discussed with him that this can complicate matters and he should always bring medications with him to all visits. Return in 3 months for visit with new physician in office.  Sooner if worsening symptoms.

## 2020-02-27 NOTE — Assessment & Plan Note (Signed)
Refer to morbid obesity plan, continue focus on healthy diet and exercise regimen.

## 2020-02-27 NOTE — Assessment & Plan Note (Signed)
Chronic, ongoing.  Followed by endocrinology with A1C 7.5% recently, January urine ALB 150 and A:C >300, continue Olmesartan for kidney protection.  Continue current collaboration endocrinology and medication regimen as prescribed by them.  Recommend focus on modest weight loss and diet changes.  Reviewed endocrinology notes.  Continue collaboration with [facility] team to assist in ongoing education about diabetes. Return in 3 months for visit with new physician in office.

## 2020-02-27 NOTE — Assessment & Plan Note (Signed)
Recheck TSH and add on Free T4 today.   

## 2020-02-27 NOTE — Assessment & Plan Note (Signed)
Chronic, ongoing.  Has referral in place for [facility] from neurology, he will check on this referral at visit this week.  Wear compression hose daily and monitor sodium intake.

## 2020-02-27 NOTE — Progress Notes (Signed)
BP 138/68 (BP Location: Left Arm, Patient Position: Sitting, Cuff Size: Large)   Pulse 64 Comment: apical  Temp 97.6 F (36.4 C) (Oral)   Ht 5' 9.69" (1.77 m)   Wt 294 lb (133.4 kg)   SpO2 95%   BMI 42.57 kg/m    Subjective:    Patient ID: Justin Griffith, male    DOB: 02/03/1941, 79 y.o.   MRN: [id number]  HPI: Justin Griffith is a 79 y.o. male  Chief Complaint  Patient presents with  . Diabetes   DIABETES Followed by [name], with last visit 01/03/20.  Last A1C was 7.5% in November.  He also sees a PCP at the [name].  CCM team working with patient.  Previously took Gabapentin for neuropathy pain.  Last visit his TSH 4.690, in November.  Hypoglycemic episodes: none Polydipsia/polyuria: no Visual disturbance: no Chest pain: no Paresthesias: no Glucose Monitoring: yes             Accucheck frequency: BID             Fasting glucose:  119 to 143 (was 143 this morning)             Post prandial: 150-160's             Evening:             Before meals: Taking Insulin?: yes             Long acting insulin: Lantus 25 units -- he reports he self doses this based on his sugars, so sometimes takes more -- sometimes 50 units             Short acting insulin: Blood Pressure Monitoring: not checking Retinal Examination: At [place] Exam: Up to Date Pneumovax: Up to Date Influenza: Up to Date Aspirin: yes   HYPERTENSION/HLD Continues on Olmesartan 40 MG daily, Clonidine 0.2 MG BID, Carvedilol 25 MG BID,Lasix 20 MG,and ASA+ Crestor 40 MG daily.Sees [name] for cardiology, last on 02/06/2020. Last EF 60-65% on 01/24/2020.  LDL in November LD 48.  Recently seen by Dr. Erlene Quan on 02/21/2020 for BPH, continues on Flomax. Hypertension status:uncontrolled Satisfied with current treatment?yes Duration of hypertension:chronic BP monitoring frequency:daily BP range:130-140 range SBP and 70-80 DBP BP medication side effects:no Medication compliance:good  compliance Previous BP meds:Losartan, Clonidine, HCTZ, Carvedilol, Amlodipine, Lasix Aspirin:yes Recurrent headaches:no Visual changes:no Palpitations:no Dyspnea:At baseline Chest pain:no Lower extremity edema:mild,baseline Dizzy/lightheaded:no  COPD Followed by [name], last seen11/18/2021. COPD status:stable Satisfied with current treatment?:yes Oxygen use:no Dyspnea frequency:at baseline Cough frequency:present, chronic Rescue inhaler frequency:Uses Duoneb QID Limitation of activity:at times Productive cough: at this time, over past week Last Spirometry:with pulmonary Pneumovax:Up to Date Influenza:Up to Date  BENIGN ESSENTIAL TREMOR Followed by neurology with last visit 02/05/2020.  They recently increased his Primidone to 100 MG BID -- he reports not taking this and was not aware --- and sent referral to vein and vascular to assess his bilateral leg swelling.  Plan is to perform nerve test of bilateral lower extremities and start PT due to gait issues being present.  He continues on Aricept for memory changes being present.  He reports he feels neurology is not assisting him, as he is still staggering -- states he is to see [name] this week, but is considering going to another neurologist in future.    Relevant past medical, surgical, family and social history reviewed and updated as indicated. Interim medical history since our last visit  reviewed. Allergies and medications reviewed and updated.  Review of Systems  Constitutional: Negative for activity change, diaphoresis, fatigue and fever.  Respiratory: Negative for cough, chest tightness, shortness of breath and wheezing.   Cardiovascular: Negative for chest pain, palpitations and leg swelling.  Gastrointestinal: Negative.   Endocrine: Negative for cold intolerance, heat intolerance, polydipsia, polyphagia and polyuria.  Neurological: Negative.   Psychiatric/Behavioral: Negative.      Per HPI unless specifically indicated above     Objective:    BP 138/68 (BP Location: Left Arm, Patient Position: Sitting, Cuff Size: Large)   Pulse 64 Comment: apical  Temp 97.6 F (36.4 C) (Oral)   Ht 5' 9.69" (1.77 m)   Wt 294 lb (133.4 kg)   SpO2 95%   BMI 42.57 kg/m   Wt Readings from Last 3 Encounters:  02/27/20 294 lb (133.4 kg)  02/21/20 289 lb (131.1 kg)  01/29/20 293 lb 3.4 oz (133 kg)    Physical Exam Vitals and nursing note reviewed.  Constitutional:      General: He is awake. He is not in acute distress.    Appearance: He is well-developed and well-groomed. He is morbidly obese. He is not ill-appearing.  HENT:     Head: Normocephalic and atraumatic.     Right Ear: Hearing, tympanic membrane, ear canal and external ear normal. No drainage.     Left Ear: Hearing, tympanic membrane, ear canal and external ear normal. No drainage.     Mouth/Throat:     Pharynx: Uvula midline. No pharyngeal swelling, oropharyngeal exudate or posterior oropharyngeal erythema.  Eyes:     General: Lids are normal.        Right eye: No discharge.        Left eye: No discharge.     Conjunctiva/sclera: Conjunctivae normal.     Pupils: Pupils are equal, round, and reactive to light.  Neck:     Thyroid: No thyromegaly.     Vascular: No carotid bruit or JVD.  Cardiovascular:     Rate and Rhythm: Normal rate and regular rhythm.     Heart sounds: Normal heart sounds, S1 normal and S2 normal. No murmur heard.  No gallop.   Pulmonary:     Effort: Pulmonary effort is normal. No accessory muscle usage or respiratory distress.     Breath sounds: Decreased breath sounds present. No wheezing.     Comments: Decreased breath sounds throughout, no wheezing.  No SOB with talking.  Mild SOB noted when walked from office door to exam room. Abdominal:     General: Bowel sounds are normal.     Palpations: Abdomen is soft. There is no hepatomegaly or splenomegaly.     Tenderness: There is no  abdominal tenderness.  Musculoskeletal:        General: Normal range of motion.     Cervical back: Normal range of motion and neck supple.     Right lower leg: Edema (trace) present.     Left lower leg: Edema (trace) present.  Lymphadenopathy:     Cervical: No cervical adenopathy.  Skin:    General: Skin is warm and dry.     Capillary Refill: Capillary refill takes less than 2 seconds.     Comments: Rubor and xerosis bilateral lower extremity.  Neurological:     Mental Status: He is alert and oriented to person, place, and time.     Cranial Nerves: Cranial nerves are intact.     Motor: Tremor present.  Gait: Gait is intact.     Deep Tendon Reflexes:     Reflex Scores:      Brachioradialis reflexes are 1+ on the right side and 1+ on the left side.      Patellar reflexes are 1+ on the right side and 1+ on the left side.    Comments: Baseline resting tremor noted to upper extremities R>L.  Negative Cogwheel.  Psychiatric:        Mood and Affect: Mood normal.        Behavior: Behavior normal. Behavior is cooperative.        Thought Content: Thought content normal.        Judgment: Judgment normal.     Results for orders placed or performed in visit on 02/21/20  Microscopic Examination   Urine  Result Value Ref Range   WBC, UA 0-5 0 - 5 /hpf   RBC 0-2 0 - 2 /hpf   Epithelial Cells (non renal) 0-10 0 - 10 /hpf   Bacteria, UA None seen None seen/Few  Urinalysis, Complete  Result Value Ref Range   Specific Gravity, UA 1.015 1.005 - 1.030   pH, UA 6.5 5.0 - 7.5   Color, UA Yellow Yellow   Appearance Ur Clear Clear   Leukocytes,UA Negative Negative   Protein,UA 2+ (A) Negative/Trace   Glucose, UA Negative Negative   Ketones, UA Negative Negative   RBC, UA Trace (A) Negative   Bilirubin, UA Negative Negative   Urobilinogen, Ur 0.2 0.2 - 1.0 mg/dL   Nitrite, UA Negative Negative   Microscopic Examination See below:   BLADDER SCAN AMB NON-IMAGING  Result Value Ref Range    Scan Result 86ml       Assessment & Plan:   Problem List Items Addressed This Visit      Cardiovascular and Mediastinum   Hypertension associated with diabetes ([place])    Chronic, ongoing with BP elevated initial, but improved on recheck.  Educated him on goal of <130/90.  Has underlying circulation issues and have recommended he wear compression hose at home + decrease sodium intake.  Continue current medication regimen + continue collaboration with cardiology and [facility] PCP.  Recommend he bring all medications to his provider visits and document all BP and BS readings, which should bring with him -- as is poor historian and unsure what he is exactly taking daily at home.  Reviewed medications exensively with him today -- he sees multiple specialists and [facility] providers -- discussed with him that this can complicate matters and he should always bring medications with him to all visits. Return in 3 months for visit with new physician in office.  Sooner if worsening symptoms.      Relevant Medications   cloNIDine (CATAPRES) 0.2 MG tablet   Other Relevant Orders   Basic metabolic panel   PAD (peripheral artery disease) (HCC)    Chronic, ongoing.  Has referral in place for Vein and Vascular from neurology, he will check on this referral at visit this week.  Wear compression hose daily and monitor sodium intake.      Relevant Medications   cloNIDine (CATAPRES) 0.2 MG tablet     Respiratory   COPD (chronic obstructive pulmonary disease) (HCC)    Chronic, ongoing.  Followed by pulmonary.  Continue current medication regimen as prescribed by them and adjust as needed -- discussed need to take Qvar BID as is ordered.  Continue Mucinex at home as needed.  Endocrine   Type 2 diabetes mellitus with proteinuria (HCC) - Primary    Chronic, ongoing.  Followed by endocrinology with A1C 7.5% recently, January urine ALB 150 and A:C >300, continue Olmesartan for kidney protection.  Continue current  collaboration endocrinology and medication regimen as prescribed by them.  Recommend focus on modest weight loss and diet changes.  Reviewed endocrinology notes.  Continue collaboration with CCM team to assist in ongoing education about diabetes. Return in 3 months for visit with new physician in office.      Hyperlipidemia associated with type 2 diabetes mellitus (HCC)    Chronic, ongoing.  Continue current medication regimen and adjust as needed.  Lipid panel next visit.        Other   BMI 40.0-44.9, adult ([place])    Refer to morbid obesity plan, continue focus on healthy diet and exercise regimen.      Morbid obesity ([place])    With T2DM and COPD. Recommended eating smaller high protein, low fat meals more frequently and exercising 30 mins a day 5 times a week with a goal of 10-15lb weight loss in the next 3 months. Patient voiced their understanding and motivation to adhere to these recommendations.       Tremor    Chronic, ongoing, followed by neurology.  Continue this collaboration and medication regimen.  Return in 3 months.      Elevated TSH    Recheck TSH and add on Free T4 today.      Relevant Orders   T4, free   TSH    Other Visit Diagnoses    Hypothyroidism, unspecified type       Recheck TSH and Free T4 today.   Relevant Orders   T4, free   TSH       Follow up plan: Return in about 3 months (around 05/27/2020) for T2DM, HTN/HLD, COPD -- meet new pcp.

## 2020-02-27 NOTE — Assessment & Plan Note (Signed)
Chronic, ongoing, followed by neurology.  Continue this collaboration and medication regimen.  Return in 3 months. 

## 2020-02-28 LAB — BASIC METABOLIC PANEL
BUN/Creatinine Ratio: 16 (ref 10–24)
BUN: 17 mg/dL (ref 8–27)
CO2: 28 mmol/L (ref 20–29)
Calcium: 10.5 mg/dL — ABNORMAL HIGH (ref 8.6–10.2)
Chloride: 99 mmol/L (ref 96–106)
Creatinine, Ser: 1.04 mg/dL (ref 0.76–1.27)
GFR calc Af Amer: 78 mL/min/{1.73_m2} (ref >59)
GFR calc non Af Amer: 67 mL/min/{1.73_m2} (ref >59)
Glucose: 142 mg/dL — ABNORMAL HIGH (ref 65–99)
Potassium: 4.5 mmol/L (ref 3.5–5.2)
Sodium: 140 mmol/L (ref 134–144)

## 2020-02-28 LAB — TSH: TSH: 3.53 u[IU]/mL (ref 0.450–4.500)

## 2020-02-28 LAB — T4, FREE: Free T4: 0.95 ng/dL (ref 0.82–1.77)

## 2020-02-28 NOTE — Progress Notes (Signed)
Good morning, please let Justin Griffith know his labs have returned.  Overall kidney function continues at baseline.  Calcium level was mildly elevated, we will recheck this next visit.  Thyroid labs are now within normal range.  If any questions please let me know.  Have a great day!! Keep being awesome!!  Thank you for allowing me to participate in your care. Kindest regards, Hermina Barnard

## 2020-02-29 ENCOUNTER — Other Ambulatory Visit: Payer: Self-pay

## 2020-02-29 ENCOUNTER — Ambulatory Visit (INDEPENDENT_AMBULATORY_CARE_PROVIDER_SITE_OTHER): Payer: Medicare Other | Admitting: Physician Assistant

## 2020-02-29 ENCOUNTER — Encounter: Payer: Self-pay | Admitting: Physician Assistant

## 2020-02-29 VITALS — BP 204/103 | HR 57 | Ht 70.0 in | Wt 288.0 lb

## 2020-02-29 DIAGNOSIS — K5900 Constipation, unspecified: Secondary | ICD-10-CM

## 2020-02-29 DIAGNOSIS — R300 Dysuria: Secondary | ICD-10-CM | POA: Diagnosis not present

## 2020-02-29 DIAGNOSIS — R58 Hemorrhage, not elsewhere classified: Secondary | ICD-10-CM

## 2020-02-29 LAB — BLADDER SCAN AMB NON-IMAGING

## 2020-02-29 NOTE — Patient Instructions (Addendum)
1. I am sending your urine for culture today to rule out infection. I will call you if I need to put you on antibiotics. 2. I would like to bring you back to clinic for a cystoscopy with Dr. Erlene Quan to look for the source of your bleeding. 3. Continue Flomax. 4. Start a bowel regimen to manage your constipation. Your goal is to have consistent, formed bowel movements that are easy for you to pass. You may use the over-the-counter laxative Miralax to help with this. You may adjust the recommended dose of Miralax (one capful daily) to achieve this goal.   Cystoscopy Cystoscopy is a procedure that is used to help diagnose and sometimes treat conditions that affect the lower urinary tract. The lower urinary tract includes the bladder and the urethra. The urethra is the tube that drains urine from the bladder. Cystoscopy is done using a thin, tube-shaped instrument with a light and camera at the end (cystoscope). The cystoscope may be hard or flexible, depending on the goal of the procedure. The cystoscope is inserted through the urethra, into the bladder. Cystoscopy may be recommended if you have:  Urinary tract infections that keep coming back.  Blood in the urine (hematuria).  An inability to control when you urinate (urinary incontinence) or an overactive bladder.  Unusual cells found in a urine sample.  A blockage in the urethra, such as a urinary stone.  Painful urination.  An abnormality in the bladder found during an intravenous pyelogram (IVP) or CT scan. Cystoscopy may also be done to remove a sample of tissue to be examined under a microscope (biopsy). Tell a health care provider about:  Any allergies you have.  All medicines you are taking, including vitamins, herbs, eye drops, creams, and over-the-counter medicines.  Any problems you or family members have had with anesthetic medicines.  Any blood disorders you have.  Any surgeries you have had.  Any medical conditions you  have.  Whether you are pregnant or may be pregnant. What are the risks? Generally, this is a safe procedure. However, problems may occur, including:  Infection.  Bleeding.  Allergic reactions to medicines.  Damage to other structures or organs. What happens before the procedure?  Ask your health care provider about: ? Changing or stopping your regular medicines. This is especially important if you are taking diabetes medicines or blood thinners. ? Taking medicines such as aspirin and ibuprofen. These medicines can thin your blood. Do not take these medicines unless your health care provider tells you to take them. ? Taking over-the-counter medicines, vitamins, herbs, and supplements.  Follow instructions from your health care provider about eating or drinking restrictions.  Ask your health care provider what steps will be taken to help prevent infection. These may include: ? Washing skin with a germ-killing soap. ? Taking antibiotic medicine.  You may have an exam or testing, such as: ? X-rays of the bladder, urethra, or kidneys. ? Urine tests to check for signs of infection.  Plan to have someone take you home from the hospital or clinic. What happens during the procedure?   You will be given one or more of the following: ? A medicine to help you relax (sedative). ? A medicine to numb the area (local anesthetic).  The area around the opening of your urethra will be cleaned.  The cystoscope will be passed through your urethra into your bladder.  Germ-free (sterile) fluid will flow through the cystoscope to fill your bladder. The fluid will  stretch your bladder so that your health care provider can clearly examine your bladder walls.  Your doctor will look at the urethra and bladder. Your doctor may take a biopsy or remove stones.  The cystoscope will be removed, and your bladder will be emptied. The procedure may vary among health care providers and hospitals. What can  I expect after the procedure? After the procedure, it is common to have:  Some soreness or pain in your abdomen and urethra.  Urinary symptoms. These include: ? Mild pain or burning when you urinate. Pain should stop within a few minutes after you urinate. This may last for up to 1 week. ? A small amount of blood in your urine for several days. ? Feeling like you need to urinate but producing only a small amount of urine. Follow these instructions at home: Medicines  Take over-the-counter and prescription medicines only as told by your health care provider.  If you were prescribed an antibiotic medicine, take it as told by your health care provider. Do not stop taking the antibiotic even if you start to feel better. General instructions  Return to your normal activities as told by your health care provider. Ask your health care provider what activities are safe for you.  Do not drive for 24 hours if you were given a sedative during your procedure.  Watch for any blood in your urine. If the amount of blood in your urine increases, call your health care provider.  Follow instructions from your health care provider about eating or drinking restrictions.  If a tissue sample was removed for testing (biopsy) during your procedure, it is up to you to get your test results. Ask your health care provider, or the department that is doing the test, when your results will be ready.  Drink enough fluid to keep your urine pale yellow.  Keep all follow-up visits as told by your health care provider. This is important. Contact a health care provider if you:  Have pain that gets worse or does not get better with medicine, especially pain when you urinate.  Have trouble urinating.  Have more blood in your urine. Get help right away if you:  Have blood clots in your urine.  Have abdominal pain.  Have a fever or chills.  Are unable to urinate. Summary  Cystoscopy is a procedure that is  used to help diagnose and sometimes treat conditions that affect the lower urinary tract.  Cystoscopy is done using a thin, tube-shaped instrument with a light and camera at the end.  After the procedure, it is common to have some soreness or pain in your abdomen and urethra.  Watch for any blood in your urine. If the amount of blood in your urine increases, call your health care provider.  If you were prescribed an antibiotic medicine, take it as told by your health care provider. Do not stop taking the antibiotic even if you start to feel better. This information is not intended to replace advice given to you by your health care provider. Make sure you discuss any questions you have with your health care provider. Document Revised: 03/01/2018 Document Reviewed: 03/01/2018 Elsevier Patient Education  [place].

## 2020-02-29 NOTE — Progress Notes (Signed)
02/29/2020 11:01 AM   Justin Griffith [birth year] [id number]  CC: Chief Complaint  Patient presents with  . Dysuria  . Hematuria    HPI: Justin Griffith is a 79 y.o. male with PMH BPH with a recent episode of urinary retention and gross hematuria during hospitalization with AMS and sepsis of unknown source. He subsequently passed a voiding trial and followed up with Dr. Erlene Quan in clinic 8 days ago. At that time, he reported no difficulty urinating and no further gross hematuria. No microscopic blood on UA that day. He was counseled to continue Flomax at that time. Cystoscopy was deferred pending gross hematuria recurrence.  Patient reports voiding every 10-15 minutes since his recent discharge due to initiation of Lasix. He has been having associated urge incontinence and is wearing diapers to manage this. Today he reports 2 episodes of seeing blood in his diapers this week. He reports the first episode was associated with severe dysuria and the appearance of pus-like material in the toilet. The second time, he also saw a spot of blood in the toilet after voiding. He notes the blood has been located in the front of his diapers; he does not believe it could be coming from his rectum.  He denies fever, chills, nausea, and vomiting. He has a history of chronic constipation and states he frequently strains to have a BM. He is not taking anything to help with his stools.  He has a 30-pack-year smoking history, having quit around 30 years ago. He is diabetic on metformin and insulin.  In-office UA today positive for 1+ blood, 2+ protein, and trace leukocyte esterase; urine microscopy with moderate bacteria. PVR 0 mL.  PMH: Past Medical History:  Diagnosis Date  . Arthritis   . Asthma 2000  . Back pain   . Colon polyp 2012  . COPD (chronic obstructive pulmonary disease) ([place])   . Diabetes mellitus without complication ([place])   . Emphysema of lung ([place])   . Heart murmur   . Hernia 2000  .  Hyperlipidemia   . Hypertension   . Personal history of malignant neoplasm of large intestine   . Personal history of tobacco use, presenting hazards to health   . Sleep apnea   . Special screening for malignant neoplasms, colon     Surgical History: Past Surgical History:  Procedure Laterality Date  . CARDIAC CATHETERIZATION Left 09/25/2015   Procedure: Left Heart Cath and Coronary Angiography;  Surgeon: Yolonda Kida, MD;  Location: [facility];  Service: Cardiovascular;  Laterality: Left;  . CHOLECYSTECTOMY  1970  . COLON SURGERY  07-16-99   sigmoid colon resection with primary anastomosis, chemotherapy for metastatic disease  . COLONOSCOPY  2001, 2012   Dr Bary Castilla, tubular adenoma of the cecum and ascending colon in 2012.  Marland Kitchen COLONOSCOPY WITH PROPOFOL N/A 02/12/2016   Procedure: COLONOSCOPY WITH PROPOFOL;  Surgeon: Robert Bellow, MD;  Location: [facility] ENDOSCOPY;  Service: Endoscopy;  Laterality: N/A;  . ESOPHAGOGASTRODUODENOSCOPY (EGD) WITH PROPOFOL N/A 02/12/2016   Procedure: ESOPHAGOGASTRODUODENOSCOPY (EGD) WITH PROPOFOL;  Surgeon: Robert Bellow, MD;  Location: [facility] ENDOSCOPY;  Service: Endoscopy;  Laterality: N/A;  . HERNIA REPAIR Right    right inguinal hernia repair  . JOINT REPLACEMENT Bilateral    knee replacement  . KNEE SURGERY Bilateral 2010  . MYRINGOTOMY WITH TUBE PLACEMENT Bilateral 08/16/2014   Procedure: MYRINGOTOMY WITH TUBE PLACEMENT;  Surgeon: Carloyn Manner, MD;  Location: [facility] ORS;  Service: ENT;  Laterality: Bilateral;  .  PERIPHERAL VASCULAR CATHETERIZATION Right 09/04/2015   Procedure: Lower Extremity Angiography;  Surgeon: Katha Cabal, MD;  Location: [facility];  Service: Cardiovascular;  Laterality: Right;  . PERIPHERAL VASCULAR CATHETERIZATION  09/04/2015   Procedure: Lower Extremity Intervention;  Surgeon: Katha Cabal, MD;  Location: [facility];  Service: Cardiovascular;;  . TONSILLECTOMY    . TYMPANOSTOMY  TUBE PLACEMENT      Home Medications:  Allergies as of 02/29/2020      Reactions   Farxiga [dapagliflozin] Rash   Jardiance [empagliflozin] Rash      Medication List       Accurate as of February 29, 2020 11:01 AM. If you have any questions, ask your nurse or doctor.        acyclovir 400 MG tablet Commonly known as: ZOVIRAX Take 400 mg by mouth 2 (two) times daily.   aspirin 325 MG EC tablet Take 1 tablet (325 mg total) by mouth daily.   azelastine 0.1 % nasal spray Commonly known as: ASTELIN Place 2 sprays into both nostrils 2 (two) times daily.   carvedilol 12.5 MG tablet Commonly known as: COREG Take 2 tablets (25 mg total) by mouth 2 (two) times daily with a meal.   clobetasol cream 0.05 % Commonly known as: TEMOVATE Apply 1 application topically 2 (two) times daily. Apply to bilateral lower legs.   cloNIDine 0.2 MG tablet Commonly known as: CATAPRES Take 1 tablet (0.2 mg total) by mouth 2 (two) times daily.   donepezil 5 MG tablet Commonly known as: Aricept Take 1 tablet (5 mg total) by mouth at bedtime.   furosemide 20 MG tablet Commonly known as: LASIX Take 1 tablet (20 mg total) by mouth daily.   insulin glargine 100 UNIT/ML injection Commonly known as: Lantus Inject 0.25 mLs (25 Units total) into the skin daily.   ipratropium-albuterol 0.5-2.5 (3) MG/3ML Soln Commonly known as: DUONEB Take 3 mLs by nebulization every 6 (six) hours.   metFORMIN 1000 MG tablet Commonly known as: GLUCOPHAGE Take 1,000 mg by mouth 2 (two) times daily with a meal.   mupirocin ointment 2 % Commonly known as: BACTROBAN Apply 1 application topically 2 (two) times daily. To open blisters bilateral legs.   olmesartan 40 MG tablet Commonly known as: BENICAR Take 1 tablet (40 mg total) by mouth daily.   predniSONE 10 MG tablet Commonly known as: DELTASONE Take 10 mg by mouth daily.   primidone 50 MG tablet Commonly known as: MYSOLINE Take 1 tablet (50 mg total) by  mouth 2 (two) times daily.   Qvar RediHaler 40 MCG/ACT inhaler Generic drug: beclomethasone 1 puff 2 (two) times daily.   rosuvastatin 40 MG tablet Commonly known as: CRESTOR Take 1 tablet (40 mg total) by mouth at bedtime. What changed: additional instructions   tamsulosin 0.4 MG Caps capsule Commonly known as: FLOMAX Take 0.4 mg by mouth daily. Patient restarted on his own due to trouble urinating   vitamin B-12 500 MCG tablet Commonly known as: CYANOCOBALAMIN Take 1,000 mcg by mouth daily. Two at night   Vitamin D (Cholecalciferol) 25 MCG (1000 UT) Caps Take 50 mcg by mouth daily. 2 a night       Allergies:  Allergies  Allergen Reactions  . Farxiga [Dapagliflozin] Rash  . Jardiance [Empagliflozin] Rash    Family History: Family History  Problem Relation Age of Onset  . Diabetes Father   . Esophageal cancer Mother   . Alzheimer's disease Paternal Uncle  Social History:   reports that he quit smoking about 30 years ago. His smoking use included cigarettes. He has a 30.00 pack-year smoking history. He has never used smokeless tobacco. He reports that he does not drink alcohol and does not use drugs.  Physical Exam: BP (!) 204/103 (BP Location: Left Arm, Patient Position: Sitting, Cuff Size: Large)   Pulse (!) 57   Ht 5\' 10"  (1.778 m)   Wt 288 lb (130.6 kg)   BMI 41.32 kg/m   Constitutional:  Alert and oriented, no acute distress, nontoxic appearing HEENT: [place], AT Cardiovascular: No clubbing, cyanosis, or edema Respiratory: Normal respiratory effort, no increased work of breathing GU: Uncircumcised penis and bilateral descended testes. Intact skin of the penis, scrotum, and inguinal creases without areas of bleeding, purulence, or fluctuance. Skin: No rashes, bruises or suspicious lesions Neurologic: Grossly intact, no focal deficits, moving all 4 extremities Psychiatric: Normal mood and affect  Laboratory Data: Results for orders placed or performed in  visit on 02/29/20  Microscopic Examination   Urine  Result Value Ref Range   WBC, UA 0-5 0 - 5 /hpf   RBC 0-2 0 - 2 /hpf   Epithelial Cells (non renal) 0-10 0 - 10 /hpf   Casts Present (A) None seen /lpf   Cast Type Hyaline casts N/A   Bacteria, UA Moderate (A) None seen/Few  Urinalysis, Complete  Result Value Ref Range   Specific Gravity, UA 1.015 1.005 - 1.030   pH, UA 7.0 5.0 - 7.5   Color, UA Straw Yellow   Appearance Ur Cloudy (A) Clear   Leukocytes,UA Trace (A) Negative   Protein,UA 2+ (A) Negative/Trace   Glucose, UA Negative Negative   Ketones, UA Negative Negative   RBC, UA 1+ (A) Negative   Bilirubin, UA Negative Negative   Urobilinogen, Ur 0.2 0.2 - 1.0 mg/dL   Nitrite, UA Negative Negative   Microscopic Examination See below:   Bladder Scan (Post Void Residual) in office  Result Value Ref Range   Scan Result 35mL    Assessment & Plan:   1. Dysuria UA benign today, PVR WNL.  Will send urine for culture for further evaluation.  Counseled patient to continue Flomax in the interim.  He expressed understanding.  Will defer initiation of finasteride pending results of cystoscopy, see #2 below. - Urinalysis, Complete - Bladder Scan (Post Void Residual) in office - CULTURE, URINE COMPREHENSIVE  2. Blood in diaper Source is unclear with clear UA today and no evidence of skin breakdown, wounds, or infections of the groin that could be contributory on physical exam.  In the setting of his worsened dysuria, I recommend proceeding with cystoscopy at this time.    3. Constipation, unspecified constipation type Possibly contributory to his symptoms via bowel bladder dysfunction or hemorrhoid promotion.  I counseled the patient to start a bowel regimen of MiraLAX to manage his constipation.  Return in about 2 weeks (around 03/14/2020) for Cystoscopy with Dr. Erlene Quan.  Debroah Loop, PA-C  [facility] [address], [place], [place] [address] [phone]

## 2020-03-01 LAB — MICROSCOPIC EXAMINATION

## 2020-03-01 LAB — URINALYSIS, COMPLETE
Bilirubin, UA: NEGATIVE
Glucose, UA: NEGATIVE
Ketones, UA: NEGATIVE
Nitrite, UA: NEGATIVE
Specific Gravity, UA: 1.015 (ref 1.005–1.030)
Urobilinogen, Ur: 0.2 mg/dL (ref 0.2–1.0)
pH, UA: 7 (ref 5.0–7.5)

## 2020-03-04 NOTE — Chronic Care Management (AMB) (Signed)
  Care Management   Note  03/04/2020 Name: BEAUFORD LANDO MRN: [id number] DOB: 22-Nov-1940  YOSSI HINCHMAN is a 79 y.o. year old male who is a primary care patient of Crissman, Jeannette How, MD and is actively engaged with the care management team. I reached out to [name] by phone today to assist with re-scheduling a follow up visit with the Pharmacist  Follow up plan: Unsuccessful telephone outreach attempt made. The care management team will reach out to the patient again over the next 7 days.  If patient returns call to provider office, please advise to call [facility]  at [address], [place], [place], [place] [address] Direct Dial: [phone] [email or web address]: [email or web address]

## 2020-03-05 LAB — CULTURE, URINE COMPREHENSIVE

## 2020-03-08 ENCOUNTER — Telehealth: Payer: Medicare Other

## 2020-03-08 ENCOUNTER — Telehealth: Payer: Self-pay | Admitting: General Practice

## 2020-03-08 NOTE — Telephone Encounter (Signed)
  Chronic Care Management   Outreach Note  03/08/2020 Name: Justin Griffith MRN: [id number] DOB: November 09, 1940  Referred by: Guadalupe Maple, MD Reason for referral : Appointment (RNCM Follow up call For Chronic Disease Management and care coordination needs )   ALWIN LANIGAN is enrolled in a Managed [place]: No  An unsuccessful telephone outreach was attempted today. The patient was referred to the case management team for assistance with care management and care coordination. The patient answered but states that he can not talk right now and ask for a reschedule.   Follow Up Plan: The care management team will reach out to the patient again over the next 30 to 60 days.   Noreene Larsson RN, MSN, [facility] Mobile: [phone]

## 2020-03-13 NOTE — Chronic Care Management (AMB) (Signed)
°  Care Management   Note  03/13/2020 Name: Justin Griffith MRN: [id number] DOB: 02-17-1941  [name] is a 79 y.o. year old male who is a primary care patient of Crissman, Jeannette How, MD and is actively engaged with the care management team. I reached out to [name] by phone today to assist with re-scheduling a follow up visit with the RN Case Manager Pharmacist  Follow up plan: Telephone appointment with care management team member scheduled JXB:JYNW 03/29/2020 Pharm D 04/15/2020  Noreene Larsson, [place], [place], [place] [address] Direct Dial: [phone] [email or web address]

## 2020-03-19 DIAGNOSIS — J449 Chronic obstructive pulmonary disease, unspecified: Secondary | ICD-10-CM | POA: Diagnosis not present

## 2020-03-19 DIAGNOSIS — G4733 Obstructive sleep apnea (adult) (pediatric): Secondary | ICD-10-CM | POA: Diagnosis not present

## 2020-03-19 DIAGNOSIS — Z8673 Personal history of transient ischemic attack (TIA), and cerebral infarction without residual deficits: Secondary | ICD-10-CM | POA: Diagnosis not present

## 2020-03-19 DIAGNOSIS — G250 Essential tremor: Secondary | ICD-10-CM | POA: Diagnosis not present

## 2020-03-19 DIAGNOSIS — Z9181 History of falling: Secondary | ICD-10-CM | POA: Diagnosis not present

## 2020-03-19 DIAGNOSIS — E785 Hyperlipidemia, unspecified: Secondary | ICD-10-CM | POA: Diagnosis not present

## 2020-03-19 DIAGNOSIS — Z791 Long term (current) use of non-steroidal anti-inflammatories (NSAID): Secondary | ICD-10-CM | POA: Diagnosis not present

## 2020-03-19 DIAGNOSIS — N401 Enlarged prostate with lower urinary tract symptoms: Secondary | ICD-10-CM | POA: Diagnosis not present

## 2020-03-19 DIAGNOSIS — E1159 Type 2 diabetes mellitus with other circulatory complications: Secondary | ICD-10-CM | POA: Diagnosis not present

## 2020-03-19 DIAGNOSIS — R319 Hematuria, unspecified: Secondary | ICD-10-CM | POA: Diagnosis not present

## 2020-03-19 DIAGNOSIS — E1169 Type 2 diabetes mellitus with other specified complication: Secondary | ICD-10-CM | POA: Diagnosis not present

## 2020-03-19 DIAGNOSIS — I152 Hypertension secondary to endocrine disorders: Secondary | ICD-10-CM | POA: Diagnosis not present

## 2020-03-19 DIAGNOSIS — Z85038 Personal history of other malignant neoplasm of large intestine: Secondary | ICD-10-CM | POA: Diagnosis not present

## 2020-03-19 DIAGNOSIS — J9610 Chronic respiratory failure, unspecified whether with hypoxia or hypercapnia: Secondary | ICD-10-CM | POA: Diagnosis not present

## 2020-03-19 DIAGNOSIS — Z794 Long term (current) use of insulin: Secondary | ICD-10-CM | POA: Diagnosis not present

## 2020-03-19 DIAGNOSIS — Z6838 Body mass index (BMI) 38.0-38.9, adult: Secondary | ICD-10-CM | POA: Diagnosis not present

## 2020-03-19 DIAGNOSIS — R808 Other proteinuria: Secondary | ICD-10-CM | POA: Diagnosis not present

## 2020-03-19 DIAGNOSIS — E1151 Type 2 diabetes mellitus with diabetic peripheral angiopathy without gangrene: Secondary | ICD-10-CM | POA: Diagnosis not present

## 2020-03-20 ENCOUNTER — Telehealth: Payer: Self-pay

## 2020-03-20 DIAGNOSIS — J449 Chronic obstructive pulmonary disease, unspecified: Secondary | ICD-10-CM | POA: Diagnosis not present

## 2020-03-20 DIAGNOSIS — E785 Hyperlipidemia, unspecified: Secondary | ICD-10-CM | POA: Diagnosis not present

## 2020-03-20 DIAGNOSIS — E1169 Type 2 diabetes mellitus with other specified complication: Secondary | ICD-10-CM | POA: Diagnosis not present

## 2020-03-20 DIAGNOSIS — R808 Other proteinuria: Secondary | ICD-10-CM | POA: Diagnosis not present

## 2020-03-20 DIAGNOSIS — I152 Hypertension secondary to endocrine disorders: Secondary | ICD-10-CM | POA: Diagnosis not present

## 2020-03-20 DIAGNOSIS — E1159 Type 2 diabetes mellitus with other circulatory complications: Secondary | ICD-10-CM | POA: Diagnosis not present

## 2020-03-20 NOTE — Telephone Encounter (Signed)
Copied from CRM [id number]. Topic: General - Other >> Mar 20, 2020  3:59 PM Mcneil, Ja-Kwan wrote: Reason for CRM: Angie with [facility] stated pt is showing signs of UTI: frequency, feel the urge to urinate but not able to, burning, smell, and elevated blood pressure 180/98 after taking blood pressure med.  Cb# [phone]  Angie requested that the request be expedited

## 2020-03-20 NOTE — Telephone Encounter (Signed)
Called and notified Angie of Justin Griffith's message. No appointments available. She is discussing with the patient to go to [facility] tomorrow to be evaluated and have testing done.

## 2020-03-20 NOTE — Telephone Encounter (Signed)
If someone able to fit him in tomorrow would benefit visit and urine testing please.  If not then recommend UC visit to assess urine to help determine what treatment is needed.  Thank you.

## 2020-03-20 NOTE — Telephone Encounter (Signed)
Routing to provider to advise.  

## 2020-03-21 DIAGNOSIS — E1142 Type 2 diabetes mellitus with diabetic polyneuropathy: Secondary | ICD-10-CM | POA: Diagnosis not present

## 2020-03-21 DIAGNOSIS — E785 Hyperlipidemia, unspecified: Secondary | ICD-10-CM | POA: Diagnosis not present

## 2020-03-21 DIAGNOSIS — I152 Hypertension secondary to endocrine disorders: Secondary | ICD-10-CM | POA: Diagnosis not present

## 2020-03-21 DIAGNOSIS — E1169 Type 2 diabetes mellitus with other specified complication: Secondary | ICD-10-CM | POA: Diagnosis not present

## 2020-03-21 DIAGNOSIS — Z794 Long term (current) use of insulin: Secondary | ICD-10-CM | POA: Diagnosis not present

## 2020-03-21 DIAGNOSIS — E1159 Type 2 diabetes mellitus with other circulatory complications: Secondary | ICD-10-CM | POA: Diagnosis not present

## 2020-03-26 ENCOUNTER — Other Ambulatory Visit: Payer: Medicare Other | Admitting: Urology

## 2020-03-26 DIAGNOSIS — Z9989 Dependence on other enabling machines and devices: Secondary | ICD-10-CM | POA: Diagnosis not present

## 2020-03-26 DIAGNOSIS — J449 Chronic obstructive pulmonary disease, unspecified: Secondary | ICD-10-CM | POA: Diagnosis not present

## 2020-03-26 DIAGNOSIS — G4733 Obstructive sleep apnea (adult) (pediatric): Secondary | ICD-10-CM | POA: Diagnosis not present

## 2020-03-26 DIAGNOSIS — R0600 Dyspnea, unspecified: Secondary | ICD-10-CM | POA: Diagnosis not present

## 2020-03-26 DIAGNOSIS — Z6841 Body Mass Index (BMI) 40.0 and over, adult: Secondary | ICD-10-CM | POA: Diagnosis not present

## 2020-03-26 DIAGNOSIS — R079 Chest pain, unspecified: Secondary | ICD-10-CM | POA: Diagnosis not present

## 2020-03-27 DIAGNOSIS — R079 Chest pain, unspecified: Secondary | ICD-10-CM | POA: Diagnosis not present

## 2020-03-28 ENCOUNTER — Emergency Department: Payer: Medicare Other

## 2020-03-28 ENCOUNTER — Other Ambulatory Visit: Payer: Self-pay

## 2020-03-28 ENCOUNTER — Inpatient Hospital Stay
Admission: EM | Admit: 2020-03-28 | Discharge: 2020-04-03 | DRG: 281 | Disposition: A | Discharge status: Skilled Nursing Facility | Payer: Medicare Other | Attending: Internal Medicine | Admitting: Internal Medicine

## 2020-03-28 DIAGNOSIS — R310 Gross hematuria: Secondary | ICD-10-CM | POA: Diagnosis present

## 2020-03-28 DIAGNOSIS — E871 Hypo-osmolality and hyponatremia: Secondary | ICD-10-CM | POA: Diagnosis present

## 2020-03-28 DIAGNOSIS — E860 Dehydration: Secondary | ICD-10-CM | POA: Diagnosis present

## 2020-03-28 DIAGNOSIS — I214 Non-ST elevation (NSTEMI) myocardial infarction: Secondary | ICD-10-CM | POA: Diagnosis not present

## 2020-03-28 DIAGNOSIS — W1830XA Fall on same level, unspecified, initial encounter: Secondary | ICD-10-CM | POA: Diagnosis present

## 2020-03-28 DIAGNOSIS — D72829 Elevated white blood cell count, unspecified: Secondary | ICD-10-CM | POA: Diagnosis present

## 2020-03-28 DIAGNOSIS — Z96653 Presence of artificial knee joint, bilateral: Secondary | ICD-10-CM | POA: Diagnosis present

## 2020-03-28 DIAGNOSIS — Z888 Allergy status to other drugs, medicaments and biological substances status: Secondary | ICD-10-CM

## 2020-03-28 DIAGNOSIS — N390 Urinary tract infection, site not specified: Secondary | ICD-10-CM | POA: Diagnosis not present

## 2020-03-28 DIAGNOSIS — N400 Enlarged prostate without lower urinary tract symptoms: Secondary | ICD-10-CM | POA: Diagnosis present

## 2020-03-28 DIAGNOSIS — Z7952 Long term (current) use of systemic steroids: Secondary | ICD-10-CM

## 2020-03-28 DIAGNOSIS — Z20822 Contact with and (suspected) exposure to covid-19: Secondary | ICD-10-CM | POA: Diagnosis present

## 2020-03-28 DIAGNOSIS — N401 Enlarged prostate with lower urinary tract symptoms: Secondary | ICD-10-CM | POA: Diagnosis present

## 2020-03-28 DIAGNOSIS — G8929 Other chronic pain: Secondary | ICD-10-CM | POA: Diagnosis present

## 2020-03-28 DIAGNOSIS — Y92012 Bathroom of single-family (private) house as the place of occurrence of the external cause: Secondary | ICD-10-CM

## 2020-03-28 DIAGNOSIS — Z7982 Long term (current) use of aspirin: Secondary | ICD-10-CM

## 2020-03-28 DIAGNOSIS — G4730 Sleep apnea, unspecified: Secondary | ICD-10-CM | POA: Diagnosis present

## 2020-03-28 DIAGNOSIS — Z820 Family history of epilepsy and other diseases of the nervous system: Secondary | ICD-10-CM

## 2020-03-28 DIAGNOSIS — R413 Other amnesia: Secondary | ICD-10-CM | POA: Diagnosis present

## 2020-03-28 DIAGNOSIS — J449 Chronic obstructive pulmonary disease, unspecified: Secondary | ICD-10-CM | POA: Diagnosis present

## 2020-03-28 DIAGNOSIS — E1165 Type 2 diabetes mellitus with hyperglycemia: Secondary | ICD-10-CM

## 2020-03-28 DIAGNOSIS — Z6839 Body mass index (BMI) 39.0-39.9, adult: Secondary | ICD-10-CM

## 2020-03-28 DIAGNOSIS — Z6833 Body mass index (BMI) 33.0-33.9, adult: Secondary | ICD-10-CM

## 2020-03-28 DIAGNOSIS — J811 Chronic pulmonary edema: Secondary | ICD-10-CM | POA: Diagnosis not present

## 2020-03-28 DIAGNOSIS — W19XXXA Unspecified fall, initial encounter: Secondary | ICD-10-CM | POA: Diagnosis not present

## 2020-03-28 DIAGNOSIS — S0083XA Contusion of other part of head, initial encounter: Secondary | ICD-10-CM | POA: Diagnosis not present

## 2020-03-28 DIAGNOSIS — Z833 Family history of diabetes mellitus: Secondary | ICD-10-CM

## 2020-03-28 DIAGNOSIS — G319 Degenerative disease of nervous system, unspecified: Secondary | ICD-10-CM | POA: Diagnosis not present

## 2020-03-28 DIAGNOSIS — R1111 Vomiting without nausea: Secondary | ICD-10-CM | POA: Diagnosis not present

## 2020-03-28 DIAGNOSIS — I2510 Atherosclerotic heart disease of native coronary artery without angina pectoris: Secondary | ICD-10-CM | POA: Diagnosis present

## 2020-03-28 DIAGNOSIS — I110 Hypertensive heart disease with heart failure: Secondary | ICD-10-CM | POA: Diagnosis present

## 2020-03-28 DIAGNOSIS — R55 Syncope and collapse: Secondary | ICD-10-CM

## 2020-03-28 DIAGNOSIS — N179 Acute kidney failure, unspecified: Secondary | ICD-10-CM | POA: Diagnosis not present

## 2020-03-28 DIAGNOSIS — M549 Dorsalgia, unspecified: Secondary | ICD-10-CM | POA: Diagnosis present

## 2020-03-28 DIAGNOSIS — Z8673 Personal history of transient ischemic attack (TIA), and cerebral infarction without residual deficits: Secondary | ICD-10-CM

## 2020-03-28 DIAGNOSIS — E876 Hypokalemia: Secondary | ICD-10-CM | POA: Diagnosis present

## 2020-03-28 DIAGNOSIS — M1990 Unspecified osteoarthritis, unspecified site: Secondary | ICD-10-CM | POA: Diagnosis present

## 2020-03-28 DIAGNOSIS — K219 Gastro-esophageal reflux disease without esophagitis: Secondary | ICD-10-CM | POA: Diagnosis present

## 2020-03-28 DIAGNOSIS — Z85038 Personal history of other malignant neoplasm of large intestine: Secondary | ICD-10-CM

## 2020-03-28 DIAGNOSIS — N138 Other obstructive and reflux uropathy: Secondary | ICD-10-CM | POA: Diagnosis present

## 2020-03-28 DIAGNOSIS — E669 Obesity, unspecified: Secondary | ICD-10-CM | POA: Diagnosis present

## 2020-03-28 DIAGNOSIS — Z79899 Other long term (current) drug therapy: Secondary | ICD-10-CM

## 2020-03-28 DIAGNOSIS — Z87891 Personal history of nicotine dependence: Secondary | ICD-10-CM

## 2020-03-28 DIAGNOSIS — Z800 Family history of malignant neoplasm of digestive organs: Secondary | ICD-10-CM

## 2020-03-28 DIAGNOSIS — I6523 Occlusion and stenosis of bilateral carotid arteries: Secondary | ICD-10-CM | POA: Diagnosis present

## 2020-03-28 DIAGNOSIS — I252 Old myocardial infarction: Secondary | ICD-10-CM | POA: Diagnosis present

## 2020-03-28 DIAGNOSIS — I10 Essential (primary) hypertension: Secondary | ICD-10-CM | POA: Diagnosis present

## 2020-03-28 DIAGNOSIS — I5032 Chronic diastolic (congestive) heart failure: Secondary | ICD-10-CM | POA: Diagnosis present

## 2020-03-28 DIAGNOSIS — E1151 Type 2 diabetes mellitus with diabetic peripheral angiopathy without gangrene: Secondary | ICD-10-CM | POA: Diagnosis present

## 2020-03-28 DIAGNOSIS — R0602 Shortness of breath: Secondary | ICD-10-CM | POA: Diagnosis not present

## 2020-03-28 DIAGNOSIS — Z794 Long term (current) use of insulin: Secondary | ICD-10-CM

## 2020-03-28 DIAGNOSIS — H748X3 Other specified disorders of middle ear and mastoid, bilateral: Secondary | ICD-10-CM | POA: Diagnosis not present

## 2020-03-28 DIAGNOSIS — E785 Hyperlipidemia, unspecified: Secondary | ICD-10-CM | POA: Diagnosis present

## 2020-03-28 DIAGNOSIS — R0902 Hypoxemia: Secondary | ICD-10-CM | POA: Diagnosis not present

## 2020-03-28 DIAGNOSIS — J439 Emphysema, unspecified: Secondary | ICD-10-CM | POA: Diagnosis present

## 2020-03-28 LAB — URINALYSIS, COMPLETE (UACMP) WITH MICROSCOPIC
Bilirubin Urine: NEGATIVE
Glucose, UA: NEGATIVE mg/dL
Ketones, ur: NEGATIVE mg/dL
Nitrite: NEGATIVE
Protein, ur: >=300 mg/dL — AB
Specific Gravity, Urine: 1.016 (ref 1.005–1.030)
Squamous Epithelial / HPF: NONE SEEN (ref 0–5)
WBC, UA: >50 WBC/hpf — ABNORMAL HIGH (ref 0–5)
pH: 5 (ref 5.0–8.0)

## 2020-03-28 LAB — BASIC METABOLIC PANEL
Anion gap: 12 (ref 5–15)
BUN: 16 mg/dL (ref 8–23)
CO2: 25 mmol/L (ref 22–32)
Calcium: 9.5 mg/dL (ref 8.9–10.3)
Chloride: 93 mmol/L — ABNORMAL LOW (ref 98–111)
Creatinine, Ser: 1.08 mg/dL (ref 0.61–1.24)
GFR, Estimated: >60 mL/min (ref >60)
Glucose, Bld: 214 mg/dL — ABNORMAL HIGH (ref 70–99)
Potassium: 3.4 mmol/L — ABNORMAL LOW (ref 3.5–5.1)
Sodium: 130 mmol/L — ABNORMAL LOW (ref 135–145)

## 2020-03-28 LAB — CBC
HCT: 39.8 % (ref 39.0–52.0)
Hemoglobin: 13.7 g/dL (ref 13.0–17.0)
MCH: 31 pg (ref 26.0–34.0)
MCHC: 34.4 g/dL (ref 30.0–36.0)
MCV: 90 fL (ref 80.0–100.0)
Platelets: 198 10*3/uL (ref 150–400)
RBC: 4.42 MIL/uL (ref 4.22–5.81)
RDW: 12.9 % (ref 11.5–15.5)
WBC: 17 10*3/uL — ABNORMAL HIGH (ref 4.0–10.5)
nRBC: 0 % (ref 0.0–0.2)

## 2020-03-28 LAB — RESP PANEL BY RT-PCR (FLU A&B, COVID) ARPGX2
Influenza A by PCR: NEGATIVE
Influenza B by PCR: NEGATIVE
SARS Coronavirus 2 by RT PCR: NEGATIVE

## 2020-03-28 LAB — PROTIME-INR
INR: 1.2 (ref 0.8–1.2)
Prothrombin Time: 14.8 seconds (ref 11.4–15.2)

## 2020-03-28 LAB — LACTIC ACID, PLASMA: Lactic Acid, Venous: 1.8 mmol/L (ref 0.5–1.9)

## 2020-03-28 LAB — PHOSPHORUS: Phosphorus: 2.3 mg/dL — ABNORMAL LOW (ref 2.5–4.6)

## 2020-03-28 LAB — TROPONIN I (HIGH SENSITIVITY)
Troponin I (High Sensitivity): 1890 ng/L (ref <18)
Troponin I (High Sensitivity): 2723 ng/L (ref <18)

## 2020-03-28 LAB — BRAIN NATRIURETIC PEPTIDE: B Natriuretic Peptide: 256.1 pg/mL — ABNORMAL HIGH (ref 0.0–100.0)

## 2020-03-28 LAB — APTT: aPTT: 30 seconds (ref 24–36)

## 2020-03-28 LAB — MAGNESIUM: Magnesium: 1.2 mg/dL — ABNORMAL LOW (ref 1.7–2.4)

## 2020-03-28 MED ORDER — ASPIRIN EC 81 MG PO TBEC
81.0000 mg | DELAYED_RELEASE_TABLET | Freq: Every day | ORAL | Status: DC
  Administered 2020-03-29 – 2020-04-03 (×6): 81 mg via ORAL
  Filled 2020-03-28 (×6): qty 1

## 2020-03-28 MED ORDER — OXYCODONE HCL 5 MG PO TABS
5.0000 mg | ORAL_TABLET | Freq: Once | ORAL | Status: AC
  Administered 2020-03-28: 5 mg via ORAL
  Filled 2020-03-28: qty 1

## 2020-03-28 MED ORDER — HEPARIN BOLUS VIA INFUSION
4000.0000 [IU] | Freq: Once | INTRAVENOUS | Status: AC
  Administered 2020-03-28: 4000 [IU] via INTRAVENOUS
  Filled 2020-03-28: qty 4000

## 2020-03-28 MED ORDER — MAGNESIUM SULFATE 2 GM/50ML IV SOLN
2.0000 g | Freq: Once | INTRAVENOUS | Status: AC
  Administered 2020-03-28: 2 g via INTRAVENOUS
  Filled 2020-03-28: qty 50

## 2020-03-28 MED ORDER — POTASSIUM CHLORIDE CRYS ER 20 MEQ PO TBCR
40.0000 meq | EXTENDED_RELEASE_TABLET | Freq: Once | ORAL | Status: AC
  Administered 2020-03-28: 40 meq via ORAL
  Filled 2020-03-28: qty 2

## 2020-03-28 MED ORDER — NITROGLYCERIN 0.4 MG SL SUBL: 0.4000 mg | SUBLINGUAL_TABLET | SUBLINGUAL | Status: DC | PRN

## 2020-03-28 MED ORDER — INSULIN ASPART 100 UNIT/ML ~~LOC~~ SOLN
0.0000 [IU] | Freq: Three times a day (TID) | SUBCUTANEOUS | Status: DC
  Administered 2020-03-29 (×2): 3 [IU] via SUBCUTANEOUS
  Administered 2020-03-29: 2 [IU] via SUBCUTANEOUS
  Administered 2020-03-30 (×2): 3 [IU] via SUBCUTANEOUS
  Administered 2020-03-31: 5 [IU] via SUBCUTANEOUS
  Administered 2020-03-31 – 2020-04-02 (×6): 3 [IU] via SUBCUTANEOUS
  Administered 2020-04-02 – 2020-04-03 (×2): 5 [IU] via SUBCUTANEOUS
  Administered 2020-04-03: 3 [IU] via SUBCUTANEOUS
  Filled 2020-03-28 (×15): qty 1

## 2020-03-28 MED ORDER — ONDANSETRON HCL 4 MG/2ML IJ SOLN
4.0000 mg | Freq: Four times a day (QID) | INTRAMUSCULAR | Status: DC | PRN
  Administered 2020-03-29: 4 mg via INTRAVENOUS
  Filled 2020-03-28: qty 2

## 2020-03-28 MED ORDER — ASPIRIN 81 MG PO CHEW
324.0000 mg | CHEWABLE_TABLET | Freq: Once | ORAL | Status: AC
  Administered 2020-03-28: 324 mg via ORAL
  Filled 2020-03-28: qty 4

## 2020-03-28 MED ORDER — OXYCODONE HCL 20 MG/ML PO CONC: 5.0000 mg | Freq: Once | ORAL | Status: DC

## 2020-03-28 MED ORDER — ACETAMINOPHEN 325 MG PO TABS
650.0000 mg | ORAL_TABLET | ORAL | Status: DC | PRN
  Administered 2020-03-28 – 2020-04-03 (×3): 650 mg via ORAL
  Filled 2020-03-28 (×3): qty 2

## 2020-03-28 MED ORDER — HEPARIN (PORCINE) 25000 UT/250ML-% IV SOLN
1400.0000 [IU]/h | INTRAVENOUS | Status: DC
  Administered 2020-03-28: 1400 [IU]/h via INTRAVENOUS
  Filled 2020-03-28: qty 250

## 2020-03-28 NOTE — ED Notes (Signed)
RT at bedside to place C-PAP

## 2020-03-28 NOTE — H&P (Signed)
History and Physical    Justin Griffith [id number] DOB: Oct 24, 1940 DOA: 03/28/2020  PCP: Guadalupe Maple, MD   Patient coming from: Home.  I have personally briefly reviewed patient's old medical records in [place]  Chief Complaint: Fall.  HPI: Justin Griffith is a 80 y.o. male with medical history significant of osteoarthritis, asthma, chronic back pain, colon polyp, COPD, emphysema of the lung, heart murmur, hernia, hyperlipidemia, hypertension, colon cancer, tobacco use, sleep apnea on CPAP, type 2 diabetes with proteinuria who is coming to the emergency department due to having a syncopal episode at home while in the bathroom hitting his forehead, sustaining a hematoma in the area and one episode of emesis. He has felt dyspneic, but denies chest pain or chest pressure. No palpitations, diaphoresis, PND, orthopnea or recent pitting edema of the lower extremities. He denies fever, chills, sore throat, rhinorrhea, wheezing or hemoptysis. No abdominal pain, diarrhea, constipation, melena or hematochezia. Complains of some dysuria, nocturia, and difficulty initiating urination with frequency, but denies hematuria. He denies polyuria, polydipsia, polyphagia or blurred vision.  ED Course: Initial vital signs were temperature 98.8 F, pulse 74, respiration 18, BP 119/60 mmHg and O2 sat 92% on room air.  Labwork: EKG shows ST and T wave changes on anterolateral leads. Please see tracing for further detail. Troponin was 1890 and then 2723 ng/L. BNP was 256.1 pg/mL. Urinalysis shows pyuria and bacteriuria. CBC showed a white count of 17.0, hemoglobin 13.7 g/dL and platelets 198. Normal PT is/INR/PTT. Sodium 130, potassium 3.4, chloride 93 and CO2 25 mmol/L. Renal function was normal. Glucose 214 mg/dL. Magnesium was 1.2 phosphorus 2.3 mg/dL.  Imaging: His 1 view chest radiograph show pulmonary vascular congestion, peribronchial thickening and left basilar atelectasis/scarring again noted. CT  head did not show any evidence of acute intracranial normality there is a right forehead hematoma. Mild cerebral atrophy and chronic small vessel ischemic disease. There was mild ethmoid sinus mucosal thickening. Bilateral mastoid effusions. Please see images and full regular report for further detail.  Review of Systems: As per HPI otherwise all other systems reviewed and are negative.  Past Medical History:  Diagnosis Date  . Arthritis   . Asthma 2000  . Back pain   . Colon polyp 2012  . COPD (chronic obstructive pulmonary disease) ([place])   . Emphysema of lung ([place])   . Heart murmur   . Hernia 2000  . Hyperlipidemia   . Hypertension   . Personal history of malignant neoplasm of large intestine   . Personal history of tobacco use, presenting hazards to health   . Sleep apnea   . Special screening for malignant neoplasms, colon   . Type 2 diabetes mellitus with proteinuria ([facility] [place] [facility]) 10/11/2014    Past Surgical History:  Procedure Laterality Date  . CARDIAC CATHETERIZATION Left 09/25/2015   Procedure: Left Heart Cath and Coronary Angiography;  Surgeon: Yolonda Kida, MD;  Location: [facility];  Service: Cardiovascular;  Laterality: Left;  . CHOLECYSTECTOMY  1970  . COLON SURGERY  07-16-99   sigmoid colon resection with primary anastomosis, chemotherapy for metastatic disease  . COLONOSCOPY  2001, 2012   Dr Bary Castilla, tubular adenoma of the cecum and ascending colon in 2012.  Marland Kitchen COLONOSCOPY WITH PROPOFOL N/A 02/12/2016   Procedure: COLONOSCOPY WITH PROPOFOL;  Surgeon: Robert Bellow, MD;  Location: [facility] ENDOSCOPY;  Service: Endoscopy;  Laterality: N/A;  . ESOPHAGOGASTRODUODENOSCOPY (EGD) WITH PROPOFOL N/A 02/12/2016   Procedure: ESOPHAGOGASTRODUODENOSCOPY (EGD) WITH PROPOFOL;  Surgeon: Robert Bellow, MD;  Location[facility] ENDOSCOPY;  Service: Endoscopy;  Laterality: N/A;  . HERNIA REPAIR Right    right inguinal hernia repair  . JOINT REPLACEMENT Bilateral    knee  replacement  . KNEE SURGERY Bilateral 2010  . MYRINGOTOMY WITH TUBE PLACEMENT Bilateral 08/16/2014   Procedure: MYRINGOTOMY WITH TUBE PLACEMENT;  Surgeon: Carloyn Manner, MD;  Location: [facility] ORS;  Service: ENT;  Laterality: Bilateral;  . PERIPHERAL VASCULAR CATHETERIZATION Right 09/04/2015   Procedure: Lower Extremity Angiography;  Surgeon: Katha Cabal, MD;  Location: [facility];  Service: Cardiovascular;  Laterality: Right;  . PERIPHERAL VASCULAR CATHETERIZATION  09/04/2015   Procedure: Lower Extremity Intervention;  Surgeon: Katha Cabal, MD;  Location: [facility];  Service: Cardiovascular;;  . TONSILLECTOMY    . TYMPANOSTOMY TUBE PLACEMENT      Social History  reports that he quit smoking about 31 years ago. His smoking use included cigarettes. He has a 30.00 pack-year smoking history. He has never used smokeless tobacco. He reports that he does not drink alcohol and does not use drugs.  Allergies  Allergen Reactions  . Farxiga [Dapagliflozin] Rash  . Jardiance [Empagliflozin] Rash    Family History  Problem Relation Age of Onset  . Diabetes Father   . Esophageal cancer Mother   . Alzheimer's disease Paternal Uncle    Prior to Admission medications   Medication Sig Start Date End Date Taking? Authorizing Provider  acyclovir (ZOVIRAX) 400 MG tablet Take 400 mg by mouth 2 (two) times daily.    [provider]  aspirin EC 325 MG EC tablet Take 1 tablet (325 mg total) by mouth daily. 12/18/19   Raiford Noble Latif, DO  azelastine (ASTELIN) 0.1 % nasal spray Place 2 sprays into both nostrils 2 (two) times daily.  11/22/19   [provider]  carvedilol (COREG) 12.5 MG tablet Take 2 tablets (25 mg total) by mouth 2 (two) times daily with a meal. 01/12/20   [name], Jolene T, NP  clobetasol cream (TEMOVATE) [id number] % Apply 1 application topically 2 (two) times daily. Apply to bilateral lower legs. 05/23/19   [name], Henrine Screws T, NP  cloNIDine  (CATAPRES) 0.2 MG tablet Take 1 tablet (0.2 mg total) by mouth 2 (two) times daily. 02/27/20   [name], Henrine Screws T, NP  donepezil (ARICEPT) 5 MG tablet Take 1 tablet (5 mg total) by mouth at bedtime. 01/01/20   [name], Henrine Screws T, NP  furosemide (LASIX) 20 MG tablet Take 1 tablet (20 mg total) by mouth daily. 12/17/19   Raiford Noble Latif, DO  insulin glargine (LANTUS) 100 UNIT/ML injection Inject 0.25 mLs (25 Units total) into the skin daily. 01/29/20   Elodia Florence., MD  ipratropium-albuterol (DUONEB) 0.5-2.5 (3) MG/3ML SOLN Take 3 mLs by nebulization every 6 (six) hours.    [provider]  metFORMIN (GLUCOPHAGE) 1000 MG tablet Take 1,000 mg by mouth 2 (two) times daily with a meal.    [provider]  mupirocin ointment (BACTROBAN) 2 % Apply 1 application topically 2 (two) times daily. To open blisters bilateral legs. 07/29/19   [name], Henrine Screws T, NP  olmesartan (BENICAR) 40 MG tablet Take 1 tablet (40 mg total) by mouth daily. 01/12/20   [name], Henrine Screws T, NP  predniSONE (DELTASONE) 10 MG tablet Take 10 mg by mouth daily. 02/08/20   [provider]  primidone (MYSOLINE) 50 MG tablet Take 1 tablet (50 mg total) by mouth 2 (two) times daily.  11/28/19   [name], Corrie Dandy T, NP  QVAR REDIHALER 40 MCG/ACT inhaler 1 puff 2 (two) times daily.  02/22/19   [provider]  rosuvastatin (CRESTOR) 40 MG tablet Take 1 tablet (40 mg total) by mouth at bedtime. Patient taking differently: Take 40 mg by mouth at bedtime. 20 mg once a day 09/07/17   Steele Sizer, MD  tamsulosin (FLOMAX) 0.4 MG CAPS capsule Take 0.4 mg by mouth daily. Patient restarted on his own due to trouble urinating    [provider]  vitamin B-12 (CYANOCOBALAMIN) 500 MCG tablet Take 1,000 mcg by mouth daily. Two at night    [provider]  Vitamin D, Cholecalciferol, 25 MCG (1000 UT) CAPS Take 50 mcg by mouth daily. 2 a night    [provider]   Physical Exam: Vitals:    03/28/20 1526 03/28/20 1700 03/28/20 1800 03/28/20 1832  BP:  (!) 106/54 (!) 104/53 113/64  Pulse:  68 61 64  Resp:  16  (!) 25  Temp:      TempSrc:      SpO2:  93% 92% 92%  Weight: 125.2 kg     Height: 5\' 10"  (1.778 m)      Constitutional: NAD, calm, comfortable Eyes: PERRL, lids and conjunctivae normal ENMT: Mucous membranes are mildly dry.. Posterior pharynx clear of any exudate or lesions. Neck: normal, supple, no masses, no thyromegaly Respiratory: clear to auscultation bilaterally, no wheezing, no crackles. Normal respiratory effort. No accessory muscle use.  Cardiovascular: Regular rate and rhythm, no murmurs / rubs / gallops. 1+ pitting bilateral lower extremity edema. 2+ pedal pulses. No carotid bruits.  Abdomen: Obese, nondistended. Bowel sounds positive. Soft, no tenderness, no masses palpated. No hepatosplenomegaly. .  Musculoskeletal: no clubbing / cyanosis. Good ROM, no contractures. Normal muscle tone.  Skin: no rashes, lesions, ulcers on limited dermatological examination. Neurologic: CN 2-12 grossly intact. Sensation intact, DTR normal. Strength 5/5 in all 4.  Psychiatric: Alert and oriented x 2, partially oriented to time and situation.. Normal mood.   Labs on Admission: I have personally reviewed following labs and imaging studies  CBC: Recent Labs  Lab 03/28/20 1535  WBC 17.0*  HGB 13.7  HCT 39.8  MCV 90.0  PLT 198    Basic Metabolic Panel: Recent Labs  Lab 03/28/20 1535  NA 130*  K 3.4*  CL 93*  CO2 25  GLUCOSE 214*  BUN 16  CREATININE 1.08  CALCIUM 9.5    GFR: Estimated Creatinine Clearance: 72.5 mL/min (by C-G formula based on SCr of 1.08 mg/dL).  Liver Function Tests: No results for input(s): AST, ALT, ALKPHOS, BILITOT, PROT, ALBUMIN in the last 168 hours.  Radiological Exams on Admission: CT HEAD WO CONTRAST  Result Date: 03/28/2020 CLINICAL DATA:  Head trauma, moderate/severe. Additional history provided: Syncopal episode with fall  and head trauma. EXAM: CT HEAD WITHOUT CONTRAST TECHNIQUE: Contiguous axial images were obtained from the base of the skull through the vertex without intravenous contrast. COMPARISON:  Head CT 01/23/2020.  Brain MRI 12/14/2019. FINDINGS: Brain: Mild cerebral atrophy. Mild ill-defined hypoattenuation within the cerebral white matter is nonspecific, but compatible with chronic small vessel ischemic disease. There is no acute intracranial hemorrhage. No demarcated cortical infarct. No extra-axial fluid collection. No evidence of intracranial mass. No midline shift. Vascular: No hyperdense vessel.  Atherosclerotic calcifications. Skull: Normal. Negative for fracture or focal lesion. Sinuses/Orbits: Visualized orbits show no acute finding. Mild ethmoid sinus mucosal thickening. Bilateral mastoid effusions. Other: Right forehead  hematoma. IMPRESSION: No evidence of acute intracranial abnormality. Right forehead hematoma. Mild cerebral atrophy and chronic small vessel ischemic disease. Mild ethmoid sinus mucosal thickening. Bilateral mastoid effusions. Electronically Signed   By: Kellie Simmering DO   On: 03/28/2020 16:21   DG Chest Portable 1 View  Result Date: 03/28/2020 CLINICAL DATA:  Acute shortness of breath. EXAM: PORTABLE CHEST 1 VIEW COMPARISON:  01/23/2020 FINDINGS: Cardiomediastinal silhouette is unchanged. Pulmonary vascular congestion is present. Peribronchial thickening and LEFT basilar atelectasis/scarring again noted. There is no evidence of focal airspace disease, pulmonary edema, suspicious pulmonary nodule/mass, pleural effusion, or pneumothorax. No acute bony abnormalities are identified. IMPRESSION: Pulmonary vascular congestion. Electronically Signed   By: Margarette Canada M.D.   On: 03/28/2020 18:20   12/16/2019 Echo complete  IMPRESSIONS:   1. Left ventricular ejection fraction, by estimation, is 60 to 65%. The  left ventricle has normal function. The left ventricle has no regional  wall motion  abnormalities. There is moderate left ventricular hypertrophy.  Left ventricular diastolic  parameters are indeterminate.  2. Right ventricular systolic function is normal. The right ventricular  size is normal.  3. The mitral valve is normal in structure. Trivial mitral valve  regurgitation.  4. AV is thickened, calcified with mildly restricted motion. Peak and  mean gradients through the AV are 26 and 14 mm Hg consistent with mild AS.  Marland Kitchen The aortic valve is abnormal. Aortic valve regurgitation is not  visualized. Mild aortic valve stenosis.  5. The inferior vena cava is dilated in size with >50% respiratory  variability, suggesting right atrial pressure of 8 mmHg.    01/24/2020 Echo limited w/out Image enhancing agent  IMPRESSIONS:  1. Left ventricular ejection fraction, by estimation, is 60 to 65%. The  left ventricle has normal function. The left ventricle has no regional  wall motion abnormalities. Left ventricular diastolic function could not  be evaluated.  2. Right ventricular systolic function is normal. The right ventricular  size is normal.  3. The mitral valve is normal in structure. No evidence of mitral valve  regurgitation.   EKG: Independently reviewed.   Vent. rate 75 BPM PR interval 224 ms QRS duration 102 ms QT/QTc 414/462 ms P-R-T axes 36 -16 153 Sinus rhythm with 1st degree A-V block Cannot rule out Inferior infarct , age undetermined ST & T wave abnormality, consider lateral ischemia Abnormal ECG   Vent. rate 75 BPM PR interval 224 ms QRS duration 102 ms QT/QTc 414/462 ms P-R-T axes 36 -16 153  Sinus rhythm with 1st degree A-V block Cannot rule out Inferior infarct , age undetermined ST & T wave abnormality, consider lateral ischemia Abnormal ECG  Assessment/Plan Principal Problem:   NSTEMI (non-ST elevated myocardial infarction) ([place])   Coronary artery disease Observation/stepdown. Continue heparin infusion. Continue supplemental  oxygen. Continue aspirin 81 mg daily. Continue carvedilol 25 mg p.o. daily. Continue rosuvastatin 40 mg p.o. daily. Had stress test 2 days ago, but no results available. Check echocardiogram. Cardiology will be following the patient.  Active Problems:   Syncope Check echocardiogram. Check carotid Doppler. Fall precautions.    Urinary tract infection Check urine culture and sensitivity. Ceftriaxone 2 g IVPB every 24 hours.    Hypertension Continuing beta-blocker and diuretic. Held alpha-blocker due to syncopal episode/soft BP. Olmesartan held due to soft blood pressures Clonidine held due to soft BP and relative bradycardia. Monitor blood pressure, heart rate, renal function electrolytes. Resume antihypertensives as needed. May need lower dosages in the setting of recent  falls.   Hyponatremia Secondary to chronic diuretic use. Measurement also decreased by hyperglycemia. Monitor sodium level.    Hypokalemia Replacement given. Magnesium was supplemented. Follow potassium level.    Hypomagnesemia Replacement was ordered. Follow magnesium level.    Hypophosphatemia K-Phos 500 mg p.o. 3 times daily x3 doses.    Type 2 diabetes mellitus with hyperglycemia (HCC) Carbohydrate modified diet. CBG monitoring with RI SS. Continue metformin 1000 mg p.o. twice daily. Resume Lantus once dose confirmed.    Leukocytosis Likely stress-induced. No fevers, chills or night sweats. Follow-up WBC.    Class 2 obesity Lifestyle modifications Follow-up with PCP.    Sleep apnea Continue CPAP at bedtime.    BPH (benign prostatic hyperplasia) Held tamsulosin due to syncopal episodes.    COPD (chronic obstructive pulmonary disease) ([place]) Supplemental oxygen as needed. Bronchodilators as needed.    Memory changes Continue donezepil.    DVT prophylaxis: On heparin infusion. Code Status:   Full code. Family Communication: Disposition Plan:   Patient is  from:  Home.  Anticipated DC to:  Home.  Anticipated DC date:  01/29/2020 or 01/30/2020.Marland Kitchen  Anticipated DC barriers: Clinical status.  Consults called:  Dr. Saralyn Pilar (cardiology). Admission status:  Observation/stepdown.  Severity of Illness: Very high due to presenting with syncopal episode likely secondary to NSTEMI. The patient will need to remain in the hospital for continuous heparin infusion and close cardio monitoring.  Reubin Milan MD [facility]  How to contact the [facility] Attending or Consulting provider [place] or covering provider during after hours [place], for this patient?   1. Check the care team in [facility] and look for a) attending/consulting [facility] provider listed and b) the [facility] team listed 2. Log into [email or web address] and use [place]'s universal password to access. If you do not have the password, please contact the hospital operator. 3. Locate the [facility] provider you are looking for under [facility] and page to a number that you can be directly reached. 4. If you still have difficulty reaching the provider, please page the [facility] (Director on Call) for the Hospitalists listed on amion for assistance.  03/28/2020, 7:42 PM   This document was prepared using Dragon voice recognition software and may contain some unintended transcription errors.

## 2020-03-28 NOTE — ED Provider Notes (Signed)
[facility] Emergency Department Provider Note ____________________________________________   Event Date/Time   First MD Initiated Contact with Patient 03/28/20 1721     (approximate)  I have reviewed the triage vital signs and the nursing notes.   HISTORY  Chief Complaint Loss of Consciousness    HPI Justin Griffith is a 80 y.o. male with PMH as noted below including COPD, diabetes, and hypertension who presents with an episode of syncope and head injury.  The patient states that he was at home and suddenly got dizzy and lost consciousness.  He fell forward and hit the front of his head.  He states that he has had increased weakness over the last several days and has had multiple falls.  He feels like his legs are very shaky.  He denies any chest pain but does report some shortness of breath.  Past Medical History:  Diagnosis Date  . Arthritis   . Asthma 2000  . Back pain   . Colon polyp 2012  . COPD (chronic obstructive pulmonary disease) ([place])   . Emphysema of lung ([place])   . Heart murmur   . Hernia 2000  . Hyperlipidemia   . Hypertension   . Personal history of malignant neoplasm of large intestine   . Personal history of tobacco use, presenting hazards to health   . Sleep apnea   . Special screening for malignant neoplasms, colon   . Type 2 diabetes mellitus with proteinuria ([place]) 10/11/2014    Patient Active Problem List   Diagnosis Date Noted  . NSTEMI (non-ST elevated myocardial infarction) ([place]) 03/28/2020  . Hypokalemia 03/28/2020  . Type 2 diabetes mellitus with hyperglycemia ([place]) 03/28/2020  . Leukocytosis 03/28/2020  . Class 2 obesity 03/28/2020  . Elevated TSH 02/25/2020  . Poor mobility 02/02/2020  . Microscopic hematuria   . Coronary artery disease 01/23/2020  . History of CVA (cerebrovascular accident) 01/23/2020  . Memory changes 01/01/2020  . Syncope 12/15/2019  . Elevated troponin 12/14/2019  . Pain in both lower  extremities 07/14/2019  . Peripheral vascular disease with stasis dermatitis 05/23/2019  . B12 deficiency 04/23/2019  . Constipation 04/21/2019  . Tremor 11/14/2018  . Morbid obesity ([place]) 09/07/2017  . COPD (chronic obstructive pulmonary disease) ([place]) 04/29/2017  . Intertrigo 02/02/2017  . BPH (benign prostatic hyperplasia) 07/23/2016  . Advanced care planning/counseling discussion 07/23/2016  . Eosinophilic esophagitis [id number]  . PAD (peripheral artery disease) ([place]) 01/08/2016  . Gastroesophageal reflux disease without esophagitis 01/06/2016  . Hyperlipidemia associated with type 2 diabetes mellitus ([place]) 01/16/2015  . Sleep apnea 01/16/2015  . BMI 40.0-44.9, adult ([place]) 01/16/2015  . Hypertension associated with diabetes ([place]) 10/11/2014  . Type 2 diabetes mellitus with proteinuria ([place]) 10/11/2014  . Back pain 10/11/2014  . History of colon cancer     Past Surgical History:  Procedure Laterality Date  . CARDIAC CATHETERIZATION Left 09/25/2015   Procedure: Left Heart Cath and Coronary Angiography;  Surgeon: Yolonda Kida, MD;  Location: [place] [facility];  Service: Cardiovascular;  Laterality: Left;  . CHOLECYSTECTOMY  1970  . COLON SURGERY  07-16-99   sigmoid colon resection with primary anastomosis, chemotherapy for metastatic disease  . COLONOSCOPY  2001, 2012   Dr Bary Castilla, tubular adenoma of the cecum and ascending colon in 2012.  Marland Kitchen COLONOSCOPY WITH PROPOFOL N/A 02/12/2016   Procedure: COLONOSCOPY WITH PROPOFOL;  Surgeon: Robert Bellow, MD;  Location: [facility] ENDOSCOPY;  Service: Endoscopy;  Laterality: N/A;  . ESOPHAGOGASTRODUODENOSCOPY (EGD)  WITH PROPOFOL N/A 02/12/2016   Procedure: ESOPHAGOGASTRODUODENOSCOPY (EGD) WITH PROPOFOL;  Surgeon: Robert Bellow, MD;  Location: [facility] ENDOSCOPY;  Service: Endoscopy;  Laterality: N/A;  . HERNIA REPAIR Right    right inguinal hernia repair  . JOINT REPLACEMENT Bilateral    knee replacement  . KNEE SURGERY Bilateral  2010  . MYRINGOTOMY WITH TUBE PLACEMENT Bilateral 08/16/2014   Procedure: MYRINGOTOMY WITH TUBE PLACEMENT;  Surgeon: Carloyn Manner, MD;  Location: [facility] ORS;  Service: ENT;  Laterality: Bilateral;  . PERIPHERAL VASCULAR CATHETERIZATION Right 09/04/2015   Procedure: Lower Extremity Angiography;  Surgeon: Katha Cabal, MD;  Location: [place] [facility];  Service: Cardiovascular;  Laterality: Right;  . PERIPHERAL VASCULAR CATHETERIZATION  09/04/2015   Procedure: Lower Extremity Intervention;  Surgeon: Katha Cabal, MD;  Location: [place] [facility];  Service: Cardiovascular;;  . TONSILLECTOMY    . TYMPANOSTOMY TUBE PLACEMENT      Prior to Admission medications   Medication Sig Start Date End Date Taking? Authorizing Provider  acyclovir (ZOVIRAX) 400 MG tablet Take 400 mg by mouth 2 (two) times daily.   Yes [provider]  carvedilol (COREG) 12.5 MG tablet Take 2 tablets (25 mg total) by mouth 2 (two) times daily with a meal. 01/12/20  Yes [name], Jolene T, NP  cloNIDine (CATAPRES) 0.2 MG tablet Take 1 tablet (0.2 mg total) by mouth 2 (two) times daily. 02/27/20  Yes [name], Jolene T, NP  donepezil (ARICEPT) 5 MG tablet Take 1 tablet (5 mg total) by mouth at bedtime. 01/01/20  Yes [name], Jolene T, NP  furosemide (LASIX) 20 MG tablet Take 1 tablet (20 mg total) by mouth daily. 12/17/19  Yes Sheikh, Omair Latif, DO  primidone (MYSOLINE) 50 MG tablet Take 1 tablet (50 mg total) by mouth 2 (two) times daily. Patient taking differently: Take 100 mg by mouth 2 (two) times daily. 11/28/19  Yes [name], Jolene T, NP  tamsulosin (FLOMAX) 0.4 MG CAPS capsule Take 0.4 mg by mouth daily. Patient restarted on his own due to trouble urinating   Yes [provider]  vitamin B-12 (CYANOCOBALAMIN) 500 MCG tablet Take 1,000 mcg by mouth daily. Two at night   Yes [provider]  Vitamin D, Cholecalciferol, 25 MCG (1000 UT) CAPS Take 50 mcg by mouth daily. 2 a night   Yes  [provider]  aspirin EC 325 MG EC tablet Take 1 tablet (325 mg total) by mouth daily. 12/18/19   Raiford Noble Latif, DO  azelastine (ASTELIN) 0.1 % nasal spray Place 2 sprays into both nostrils 2 (two) times daily.  11/22/19   [provider]  clobetasol cream (TEMOVATE) [id number] % Apply 1 application topically 2 (two) times daily. Apply to bilateral lower legs. 05/23/19   [name], Henrine Screws T, NP  insulin glargine (LANTUS) 100 UNIT/ML injection Inject 0.25 mLs (25 Units total) into the skin daily. 01/29/20   Elodia Florence., MD  ipratropium-albuterol (DUONEB) 0.5-2.5 (3) MG/3ML SOLN Take 3 mLs by nebulization every 6 (six) hours.    [provider]  metFORMIN (GLUCOPHAGE) 1000 MG tablet Take 1,000 mg by mouth 2 (two) times daily with a meal.    [provider]  mupirocin ointment (BACTROBAN) 2 % Apply 1 application topically 2 (two) times daily. To open blisters bilateral legs. 07/29/19   [name], Henrine Screws T, NP  olmesartan (BENICAR) 40 MG tablet Take 1 tablet (40 mg total) by mouth daily. 01/12/20   [name], NP  predniSONE (  DELTASONE) 10 MG tablet Take 10 mg by mouth daily. 02/08/20   [provider]  QVAR REDIHALER 40 MCG/ACT inhaler 1 puff 2 (two) times daily.  02/22/19   [provider]  rosuvastatin (CRESTOR) 40 MG tablet Take 1 tablet (40 mg total) by mouth at bedtime. Patient taking differently: Take 40 mg by mouth at bedtime. 20 mg once a day 09/07/17   Guadalupe Maple, MD    Allergies Wilder Glade [dapagliflozin] and Jardiance [empagliflozin]  Family History  Problem Relation Age of Onset  . Diabetes Father   . Esophageal cancer Mother   . Alzheimer's disease Paternal Uncle     Social History Social History   Tobacco Use  . Smoking status: Former Smoker    Packs/day: 1.00    Years: 30.00    Pack years: 30.00    Types: Cigarettes    Quit date: 03/23/1989    Years since quitting: 31.0  . Smokeless tobacco: Never Used   Vaping Use  . Vaping Use: Never used  Substance Use Topics  . Alcohol use: No    Alcohol/week: 0.0 standard drinks  . Drug use: No    Review of Systems  Constitutional: No fever.  Positive for weakness. Eyes: No redness. ENT: No sore throat. Cardiovascular: Denies chest pain. Respiratory: Positive for shortness of breath. Gastrointestinal: Positive for resolved vomiting.  No diarrhea.  Genitourinary: Negative for dysuria.  Musculoskeletal: Negative for back pain. Skin: Negative for rash. Neurological: Negative for headache.   ____________________________________________   PHYSICAL EXAM:  VITAL SIGNS: ED Triage Vitals  Enc Vitals Group     BP 03/28/20 1525 119/60     Pulse Rate 03/28/20 1525 74     Resp 03/28/20 1525 18     Temp 03/28/20 1525 98.8 F (37.1 C)     Temp Source 03/28/20 1525 Oral     SpO2 03/28/20 1525 92 %     Weight 03/28/20 1526 276 lb (125.2 kg)     Height 03/28/20 1526 5\' 10"  (1.778 m)     Head Circumference --      Peak Flow --      Pain Score 03/28/20 1832 8     Pain Loc --      Pain Edu? --      Excl. in [place]? --     Constitutional: Alert and oriented.  Weak appearing but in no acute distress. Eyes: Conjunctivae are normal.  EOMI.  PERRLA. Head: 5 cm right forehead hematoma. Nose: No congestion/rhinnorhea. Mouth/Throat: Mucous membranes are dry.   Neck: Normal range of motion.  Cardiovascular: Normal rate, regular rhythm. Grossly normal heart sounds.  Good peripheral circulation. Respiratory: Normal respiratory effort.  No retractions.  Somewhat diminished breath sounds bilaterally. Gastrointestinal: Soft and nontender. No distention.  Genitourinary: No flank tenderness. Musculoskeletal: 1+ bilateral lower extremity edema.  Extremities warm and well perfused.  Neurologic:  Normal speech and language. No gross focal neurologic deficits are appreciated.  Skin:  Skin is warm and dry. No rash noted. Psychiatric: Mood and affect are normal.  Speech and behavior are normal.  ____________________________________________   LABS (all labs ordered are listed, but only abnormal results are displayed)  Labs Reviewed  BASIC METABOLIC PANEL - Abnormal; Notable for the following components:      Result Value   Sodium 130 (*)    Potassium 3.4 (*)    Chloride 93 (*)    Glucose, Bld 214 (*)    All other components within normal limits  CBC - Abnormal; Notable for the following components:   WBC 17.0 (*)    All other components within normal limits  URINALYSIS, COMPLETE (UACMP) WITH MICROSCOPIC - Abnormal; Notable for the following components:   Color, Urine AMBER (*)    APPearance CLOUDY (*)    Hgb urine dipstick SMALL (*)    Protein, ur >=300 (*)    Leukocytes,Ua MODERATE (*)    WBC, UA >50 (*)    Bacteria, UA MANY (*)    All other components within normal limits  BRAIN NATRIURETIC PEPTIDE - Abnormal; Notable for the following components:   B Natriuretic Peptide 256.1 (*)    All other components within normal limits  MAGNESIUM - Abnormal; Notable for the following components:   Magnesium 1.2 (*)    All other components within normal limits  PHOSPHORUS - Abnormal; Notable for the following components:   Phosphorus 2.3 (*)    All other components within normal limits  TROPONIN I (HIGH SENSITIVITY) - Abnormal; Notable for the following components:   Troponin I (High Sensitivity) 1,890 (*)    All other components within normal limits  TROPONIN I (HIGH SENSITIVITY) - Abnormal; Notable for the following components:   Troponin I (High Sensitivity) 2,723 (*)    All other components within normal limits  RESP PANEL BY RT-PCR (FLU A&B, COVID) [id number]  LACTIC ACID, PLASMA  APTT  PROTIME-INR  HEPARIN LEVEL (UNFRACTIONATED)  BASIC METABOLIC PANEL  CBC  HEMOGLOBIN A1C  CBG MONITORING, ED   ____________________________________________  EKG  ED ECG REPORT I, Dionne Bucy, the attending physician, personally viewed and  interpreted this ECG.  Date: 03/28/2020 EKG Time: 1534 Rate: 68 Rhythm: normal sinus rhythm QRS Axis: normal Intervals: normal ST/T Wave abnormalities: Nonspecific T wave abnormalities Narrative Interpretation: Nonspecific abnormalities with no evidence of acute ischemia  ED ECG REPORT I, Dionne Bucy, the attending physician, personally viewed and interpreted this ECG.  Date: 03/28/2020 EKG Time: 1829 Rate: 73 Rhythm: normal sinus rhythm QRS Axis: normal Intervals: normal ST/T Wave abnormalities: Nonspecific T wave abnormalities anteriorly Narrative Interpretation: Nonspecific abnormalities with no evidence of acute ischemia   ____________________________________________  RADIOLOGY  CT head: No ICH or other acute intracranial abnormality Chest x-ray interpreted by me shows some pulmonary vascular congestion with no focal infiltrate or frank edema  ____________________________________________   PROCEDURES  Procedure(s) performed: No  Procedures  Critical Care performed: Yes  CRITICAL CARE Performed by: Dionne Bucy   Total critical care time: 30 minutes  Critical care time was exclusive of separately billable procedures and treating other patients.  Critical care was necessary to treat or prevent imminent or life-threatening deterioration.  Critical care was time spent personally by me on the following activities: development of treatment plan with patient and/or surrogate as well as nursing, discussions with consultants, evaluation of patient's response to treatment, examination of patient, obtaining history from patient or surrogate, ordering and performing treatments and interventions, ordering and review of laboratory studies, ordering and review of radiographic studies, pulse oximetry and re-evaluation of patient's condition. ____________________________________________   INITIAL IMPRESSION / ASSESSMENT AND PLAN / ED COURSE  Pertinent labs &  imaging results that were available during my care of the patient were reviewed by me and considered in my medical decision making (see chart for details).  80 year old male with PMH as noted above including hypertension, CAD and COPD presents with a syncopal episode with head injury, and with increased generalized weakness and multiple falls over the last few days.  He  denies any chest pain.  I reviewed the past medical records in [place].  The patient was most recently admitted for possible sepsis in November.  Of note, he had a myocardial perfusion stress test with [name] yesterday although it appears the results are not yet available.  On exam the patient is weak appearing but in no distress.  Vital signs are normal except for borderline low O2 saturation.  Neurologic exam is nonfocal.  Exam is otherwise as described above.  EKG shows nonspecific T wave abnormalities with no clear ischemic changes.  When I went to see the patient, initial labs had resulted, including a troponin of 1800.  It appears that an RN was notified of the elevated troponin result although I was not notified until the patient was placed in the exam room and I went to look up the labs.  The patient has no chest pain or obvious ischemic changes on his chest x-ray, so the etiology of the elevated troponin is unclear.  It is possible that the patient is having ACS leading to weakness and syncope, versus demand ischemia related to the syncopal episode or another etiology of generalized weakness such as an acute infection.  However, given the level of the troponin, I think would be prudent to start the patient on a heparin infusion.  We will obtain additional labs, COVID swab, give aspirin and heparin, and plan for admission.  ----------------------------------------- 7:29 PM on 03/28/2020 -----------------------------------------  I consulted Dr. Saralyn Pilar from cardiology who agreed with the heparin  infusion and the current management.  The patient remains hemodynamically stable.  I discussed this case with Dr. Olevia Bowens from the hospitalist service for admission.  ____________________________________________   FINAL CLINICAL IMPRESSION(S) / ED DIAGNOSES  Final diagnoses:  Syncope, unspecified syncope type  NSTEMI (non-ST elevated myocardial infarction) ([place])      NEW MEDICATIONS STARTED DURING THIS VISIT:  New Prescriptions   No medications on file     Note:  This document was prepared using Dragon voice recognition software and may include unintentional dictation errors.    Arta Silence, MD 03/28/20 2351

## 2020-03-28 NOTE — ED Notes (Signed)
Pt pulled into bed with RN Domenick Bookbinder EDT, RN Rexford Maus RN. Pt cleaned up and clean brief placed on pt. Pt placed in gown and placed on monitor.

## 2020-03-28 NOTE — Progress Notes (Signed)
ANTICOAGULATION CONSULT NOTE - Initial Consult  Pharmacy Consult for Heparin  Indication: chest pain/ACS  Allergies  Allergen Reactions  . Farxiga [Dapagliflozin] Rash  . Jardiance [Empagliflozin] Rash    Patient Measurements: Height: 5\' 10"  (177.8 cm) Weight: 125.2 kg (276 lb) IBW/kg (Calculated) : 73 Heparin Dosing Weight:  101.4 kg   Vital Signs: Temp: 98.8 F (37.1 C) (01/06 1525) Temp Source: Oral (01/06 1525) BP: 106/54 (01/06 1700) Pulse Rate: 68 (01/06 1700)  Labs: Recent Labs    03/28/20 1535  HGB 13.7  HCT 39.8  PLT 198  CREATININE 1.08  TROPONINIHS 1,890*    Estimated Creatinine Clearance: 72.5 mL/min (by C-G formula based on SCr of 1.08 mg/dL).   Medical History: Past Medical History:  Diagnosis Date  . Arthritis   . Asthma 2000  . Back pain   . Colon polyp 2012  . COPD (chronic obstructive pulmonary disease) (HCC)   . Diabetes mellitus without complication (H[facility])   . Emphysema of lung ([facility])   . Heart murmur   . Hernia 2000  . Hyperlipidemia   . Hypertension   . Personal history of malignant neoplasm of large intestine   . Personal history of tobacco use, presenting hazards to health   . Sleep apnea   . Special screening for malignant neoplasms, colon     Medications:  (Not in a hospital admission)   Assessment: Pharmacy consulted to dose heparin in this 80 year old male presenting with ACS/NSTEMI.  No prior anticoag noted.  CrCl = 72.5 ml/min  Goal of Therapy:  Heparin level 0.3-0.7 units/ml Monitor platelets by anticoagulation protocol: Yes   Plan:  Give 4000 units bolus x 1 Start heparin infusion at 1400 units/hr Check anti-Xa level in 8 hours and daily while on heparin Continue to monitor H&H and platelets  Justin Griffith D 03/28/2020,6:07 PM

## 2020-03-28 NOTE — ED Triage Notes (Signed)
Pt comes into the ED via EMS from home, had a syncopal episode in the BR falling and hitting the front of his head with a noted hematoma noted on arrival, had one episode of vomiting with EMS, CBG 149,  #20g LAC VSS

## 2020-03-29 ENCOUNTER — Observation Stay
Admit: 2020-03-29 | Discharge: 2020-03-29 | Disposition: A | Discharge status: Home / Self Care | Payer: Medicare Other | Attending: Internal Medicine | Admitting: Internal Medicine

## 2020-03-29 ENCOUNTER — Telehealth: Payer: Medicare Other

## 2020-03-29 ENCOUNTER — Other Ambulatory Visit: Payer: Self-pay

## 2020-03-29 ENCOUNTER — Observation Stay: Admit: 2020-03-29 | Payer: Medicare Other

## 2020-03-29 ENCOUNTER — Observation Stay: Payer: Medicare Other

## 2020-03-29 ENCOUNTER — Telehealth: Payer: Self-pay | Admitting: General Practice

## 2020-03-29 DIAGNOSIS — Y92012 Bathroom of single-family (private) house as the place of occurrence of the external cause: Secondary | ICD-10-CM | POA: Diagnosis not present

## 2020-03-29 DIAGNOSIS — R55 Syncope and collapse: Secondary | ICD-10-CM | POA: Diagnosis not present

## 2020-03-29 DIAGNOSIS — S0083XA Contusion of other part of head, initial encounter: Secondary | ICD-10-CM | POA: Diagnosis present

## 2020-03-29 DIAGNOSIS — N179 Acute kidney failure, unspecified: Secondary | ICD-10-CM | POA: Diagnosis present

## 2020-03-29 DIAGNOSIS — Z6839 Body mass index (BMI) 39.0-39.9, adult: Secondary | ICD-10-CM | POA: Diagnosis not present

## 2020-03-29 DIAGNOSIS — R279 Unspecified lack of coordination: Secondary | ICD-10-CM | POA: Diagnosis not present

## 2020-03-29 DIAGNOSIS — E871 Hypo-osmolality and hyponatremia: Secondary | ICD-10-CM | POA: Diagnosis present

## 2020-03-29 DIAGNOSIS — I6523 Occlusion and stenosis of bilateral carotid arteries: Secondary | ICD-10-CM | POA: Diagnosis present

## 2020-03-29 DIAGNOSIS — R310 Gross hematuria: Secondary | ICD-10-CM | POA: Diagnosis present

## 2020-03-29 DIAGNOSIS — I214 Non-ST elevation (NSTEMI) myocardial infarction: Secondary | ICD-10-CM | POA: Diagnosis not present

## 2020-03-29 DIAGNOSIS — Z20822 Contact with and (suspected) exposure to covid-19: Secondary | ICD-10-CM | POA: Diagnosis present

## 2020-03-29 DIAGNOSIS — F0150 Vascular dementia without behavioral disturbance: Secondary | ICD-10-CM | POA: Diagnosis not present

## 2020-03-29 DIAGNOSIS — R5381 Other malaise: Secondary | ICD-10-CM | POA: Diagnosis not present

## 2020-03-29 DIAGNOSIS — J441 Chronic obstructive pulmonary disease with (acute) exacerbation: Secondary | ICD-10-CM | POA: Diagnosis not present

## 2020-03-29 DIAGNOSIS — I2510 Atherosclerotic heart disease of native coronary artery without angina pectoris: Secondary | ICD-10-CM | POA: Diagnosis present

## 2020-03-29 DIAGNOSIS — W1830XA Fall on same level, unspecified, initial encounter: Secondary | ICD-10-CM | POA: Diagnosis present

## 2020-03-29 DIAGNOSIS — F445 Conversion disorder with seizures or convulsions: Secondary | ICD-10-CM | POA: Diagnosis not present

## 2020-03-29 DIAGNOSIS — R319 Hematuria, unspecified: Secondary | ICD-10-CM | POA: Diagnosis not present

## 2020-03-29 DIAGNOSIS — N390 Urinary tract infection, site not specified: Secondary | ICD-10-CM | POA: Diagnosis present

## 2020-03-29 DIAGNOSIS — E860 Dehydration: Secondary | ICD-10-CM | POA: Diagnosis present

## 2020-03-29 DIAGNOSIS — E569 Vitamin deficiency, unspecified: Secondary | ICD-10-CM | POA: Diagnosis not present

## 2020-03-29 DIAGNOSIS — N400 Enlarged prostate without lower urinary tract symptoms: Secondary | ICD-10-CM | POA: Diagnosis present

## 2020-03-29 DIAGNOSIS — I10 Essential (primary) hypertension: Secondary | ICD-10-CM | POA: Diagnosis present

## 2020-03-29 DIAGNOSIS — E1165 Type 2 diabetes mellitus with hyperglycemia: Secondary | ICD-10-CM | POA: Diagnosis present

## 2020-03-29 DIAGNOSIS — M6281 Muscle weakness (generalized): Secondary | ICD-10-CM | POA: Diagnosis not present

## 2020-03-29 DIAGNOSIS — E876 Hypokalemia: Secondary | ICD-10-CM | POA: Diagnosis present

## 2020-03-29 DIAGNOSIS — G4730 Sleep apnea, unspecified: Secondary | ICD-10-CM | POA: Diagnosis present

## 2020-03-29 DIAGNOSIS — J439 Emphysema, unspecified: Secondary | ICD-10-CM | POA: Diagnosis present

## 2020-03-29 DIAGNOSIS — I110 Hypertensive heart disease with heart failure: Secondary | ICD-10-CM | POA: Diagnosis present

## 2020-03-29 DIAGNOSIS — B0089 Other herpesviral infection: Secondary | ICD-10-CM | POA: Diagnosis not present

## 2020-03-29 DIAGNOSIS — E785 Hyperlipidemia, unspecified: Secondary | ICD-10-CM | POA: Diagnosis present

## 2020-03-29 DIAGNOSIS — I5032 Chronic diastolic (congestive) heart failure: Secondary | ICD-10-CM | POA: Diagnosis present

## 2020-03-29 DIAGNOSIS — I5020 Unspecified systolic (congestive) heart failure: Secondary | ICD-10-CM | POA: Diagnosis not present

## 2020-03-29 DIAGNOSIS — N39498 Other specified urinary incontinence: Secondary | ICD-10-CM | POA: Diagnosis not present

## 2020-03-29 DIAGNOSIS — K219 Gastro-esophageal reflux disease without esophagitis: Secondary | ICD-10-CM | POA: Diagnosis present

## 2020-03-29 DIAGNOSIS — R21 Rash and other nonspecific skin eruption: Secondary | ICD-10-CM | POA: Diagnosis not present

## 2020-03-29 DIAGNOSIS — E1151 Type 2 diabetes mellitus with diabetic peripheral angiopathy without gangrene: Secondary | ICD-10-CM | POA: Diagnosis present

## 2020-03-29 LAB — CBC
HCT: 39 % (ref 39.0–52.0)
HCT: 36.4 % — ABNORMAL LOW (ref 39.0–52.0)
Hemoglobin: 13.6 g/dL (ref 13.0–17.0)
Hemoglobin: 12.5 g/dL — ABNORMAL LOW (ref 13.0–17.0)
MCH: 31.5 pg (ref 26.0–34.0)
MCH: 31 pg (ref 26.0–34.0)
MCHC: 34.9 g/dL (ref 30.0–36.0)
MCHC: 34.3 g/dL (ref 30.0–36.0)
MCV: 90.3 fL (ref 80.0–100.0)
MCV: 90.3 fL (ref 80.0–100.0)
Platelets: 202 10*3/uL (ref 150–400)
Platelets: 214 10*3/uL (ref 150–400)
RBC: 4.32 MIL/uL (ref 4.22–5.81)
RBC: 4.03 MIL/uL — ABNORMAL LOW (ref 4.22–5.81)
RDW: 13.2 % (ref 11.5–15.5)
RDW: 13.2 % (ref 11.5–15.5)
WBC: 15.1 10*3/uL — ABNORMAL HIGH (ref 4.0–10.5)
WBC: 13.9 10*3/uL — ABNORMAL HIGH (ref 4.0–10.5)
nRBC: 0 % (ref 0.0–0.2)
nRBC: 0 % (ref 0.0–0.2)

## 2020-03-29 LAB — ECHOCARDIOGRAM COMPLETE
AR max vel: 1.85 cm2
AV Area VTI: 1.87 cm2
AV Area mean vel: 1.9 cm2
AV Mean grad: 12 mmHg
AV Peak grad: 23.9 mmHg
Ao pk vel: 2.45 m/s
Area-P 1/2: 2.94 cm2
Height: 70 in
S' Lateral: 2.8 cm
Weight: 4416 oz

## 2020-03-29 LAB — BASIC METABOLIC PANEL
Anion gap: 11 (ref 5–15)
BUN: 21 mg/dL (ref 8–23)
CO2: 27 mmol/L (ref 22–32)
Calcium: 9.4 mg/dL (ref 8.9–10.3)
Chloride: 96 mmol/L — ABNORMAL LOW (ref 98–111)
Creatinine, Ser: 1.85 mg/dL — ABNORMAL HIGH (ref 0.61–1.24)
GFR, Estimated: 36 mL/min — ABNORMAL LOW (ref >60)
Glucose, Bld: 144 mg/dL — ABNORMAL HIGH (ref 70–99)
Potassium: 3.6 mmol/L (ref 3.5–5.1)
Sodium: 134 mmol/L — ABNORMAL LOW (ref 135–145)

## 2020-03-29 LAB — HEMOGLOBIN A1C
Hgb A1c MFr Bld: 6.9 % — ABNORMAL HIGH (ref 4.8–5.6)
Mean Plasma Glucose: 151.33 mg/dL

## 2020-03-29 LAB — CBG MONITORING, ED
Glucose-Capillary: 193 mg/dL — ABNORMAL HIGH (ref 70–99)
Glucose-Capillary: 181 mg/dL — ABNORMAL HIGH (ref 70–99)
Glucose-Capillary: 124 mg/dL — ABNORMAL HIGH (ref 70–99)
Glucose-Capillary: 156 mg/dL — ABNORMAL HIGH (ref 70–99)

## 2020-03-29 LAB — HEPARIN LEVEL (UNFRACTIONATED)
Heparin Unfractionated: 0.11 IU/mL — ABNORMAL LOW (ref 0.30–0.70)
Heparin Unfractionated: <0.1 IU/mL — ABNORMAL LOW (ref 0.30–0.70)

## 2020-03-29 LAB — TROPONIN I (HIGH SENSITIVITY): Troponin I (High Sensitivity): 4791 ng/L (ref <18)

## 2020-03-29 MED ORDER — SODIUM CHLORIDE 0.9 % IV SOLN
2.0000 g | INTRAVENOUS | Status: DC
  Administered 2020-03-29 – 2020-04-02 (×5): 2 g via INTRAVENOUS
  Filled 2020-03-29: qty 20
  Filled 2020-03-29 (×2): qty 2
  Filled 2020-03-29: qty 20
  Filled 2020-03-29: qty 2

## 2020-03-29 MED ORDER — CARVEDILOL 12.5 MG PO TABS
12.5000 mg | ORAL_TABLET | Freq: Two times a day (BID) | ORAL | Status: DC
  Administered 2020-03-29 – 2020-04-01 (×6): 12.5 mg via ORAL
  Filled 2020-03-29: qty 1
  Filled 2020-03-29 (×2): qty 2
  Filled 2020-03-29 (×2): qty 1
  Filled 2020-03-29: qty 2

## 2020-03-29 MED ORDER — IPRATROPIUM-ALBUTEROL 0.5-2.5 (3) MG/3ML IN SOLN: 3.0000 mL | Freq: Four times a day (QID) | RESPIRATORY_TRACT | Status: DC | PRN

## 2020-03-29 MED ORDER — K PHOS MONO-SOD PHOS DI & MONO 155-852-130 MG PO TABS
500.0000 mg | ORAL_TABLET | Freq: Three times a day (TID) | ORAL | Status: AC
  Administered 2020-03-29 (×3): 500 mg via ORAL
  Filled 2020-03-29 (×3): qty 2

## 2020-03-29 MED ORDER — ASPIRIN EC 81 MG PO TBEC: 81.0000 mg | DELAYED_RELEASE_TABLET | Freq: Every day | ORAL | Status: DC

## 2020-03-29 MED ORDER — VITAMIN D 25 MCG (1000 UNIT) PO TABS
2000.0000 [IU] | ORAL_TABLET | Freq: Every day | ORAL | Status: DC
  Administered 2020-03-30 – 2020-04-03 (×5): 2000 [IU] via ORAL
  Filled 2020-03-29 (×5): qty 2

## 2020-03-29 MED ORDER — PERFLUTREN LIPID MICROSPHERE
1.0000 mL | INTRAVENOUS | Status: AC | PRN
  Administered 2020-03-29: 2 mL via INTRAVENOUS
  Filled 2020-03-29: qty 10

## 2020-03-29 MED ORDER — DONEPEZIL HCL 5 MG PO TABS
5.0000 mg | ORAL_TABLET | Freq: Every day | ORAL | Status: DC
  Administered 2020-03-29 – 2020-04-02 (×5): 5 mg via ORAL
  Filled 2020-03-29 (×6): qty 1

## 2020-03-29 MED ORDER — VITAMIN B-12 1000 MCG PO TABS
1000.0000 ug | ORAL_TABLET | Freq: Every day | ORAL | Status: DC
  Administered 2020-03-30 – 2020-04-03 (×5): 1000 ug via ORAL
  Filled 2020-03-29 (×5): qty 1

## 2020-03-29 MED ORDER — METFORMIN HCL 500 MG PO TABS: 1000.0000 mg | ORAL_TABLET | Freq: Two times a day (BID) | ORAL | Status: DC

## 2020-03-29 MED ORDER — CARVEDILOL 25 MG PO TABS: 25.0000 mg | ORAL_TABLET | Freq: Two times a day (BID) | ORAL | Status: DC

## 2020-03-29 MED ORDER — PRIMIDONE 50 MG PO TABS
100.0000 mg | ORAL_TABLET | Freq: Two times a day (BID) | ORAL | Status: DC
Start: 2020-03-29 — End: 2020-04-03
  Administered 2020-03-29 – 2020-04-03 (×10): 100 mg via ORAL
  Filled 2020-03-29 (×12): qty 2

## 2020-03-29 MED ORDER — ROSUVASTATIN CALCIUM 10 MG PO TABS
40.0000 mg | ORAL_TABLET | Freq: Every day | ORAL | Status: DC
  Administered 2020-03-29 – 2020-04-02 (×5): 40 mg via ORAL
  Filled 2020-03-29 (×4): qty 4
  Filled 2020-03-29 (×2): qty 2

## 2020-03-29 MED ORDER — INSULIN GLARGINE 100 UNIT/ML ~~LOC~~ SOLN
12.0000 [IU] | Freq: Every day | SUBCUTANEOUS | Status: DC
  Administered 2020-03-29 – 2020-04-03 (×6): 12 [IU] via SUBCUTANEOUS
  Filled 2020-03-29 (×9): qty 0.12

## 2020-03-29 MED ORDER — HEPARIN (PORCINE) 25000 UT/250ML-% IV SOLN
1700.0000 [IU]/h | INTRAVENOUS | Status: DC
  Administered 2020-03-29: 1700 [IU]/h via INTRAVENOUS

## 2020-03-29 MED ORDER — HEPARIN BOLUS VIA INFUSION
3000.0000 [IU] | Freq: Once | INTRAVENOUS | Status: AC
  Administered 2020-03-29: 3000 [IU] via INTRAVENOUS
  Filled 2020-03-29: qty 3000

## 2020-03-29 NOTE — ED Notes (Addendum)
Pt found to have gross hematuria and clots in male primofit and suction canister.  MD notified and told to hold heparin at this time.

## 2020-03-29 NOTE — Progress Notes (Signed)
Inpatient Diabetes Program Recommendations  AACE/ADA: New Consensus Statement on Inpatient Glycemic Control (2015)  Target Ranges:  Prepandial:   less than 140 mg/dL      Peak postprandial:   less than 180 mg/dL (1-2 hours)      Critically ill patients:  140 - 180 mg/dL   Lab Results  Component Value Date   GLUCAP 181 (H) 03/29/2020   HGBA1C 6.9 (H) 03/29/2020    Review of Glycemic Control Results for JATINDER, MCDONAGH (MRN [id number]) as of 03/29/2020 14:36  Ref. Range 03/29/2020 08:50 03/29/2020 11:38  Glucose-Capillary Latest Ref Range: 70 - 99 mg/dL 193 (H) 181 (H)   Diabetes history: DM 2 Outpatient Diabetes medications:  Lantus 25 units daily Metformin 1000 mg bid Current orders for Inpatient glycemic control:  Novolog moderate tid with meals  Inpatient Diabetes Program Recommendations:    While in the hospital, consider adding Lantus 12 units daily (1/2 of home dose).   Thanks  Adah Perl, RN, BC-ADM Inpatient Diabetes Coordinator Pager [phone] (8a-5p)

## 2020-03-29 NOTE — ED Notes (Signed)
Pt with RA sats 87-88 - see flowsheet. Pt placed on 3 LNC. sats 95%

## 2020-03-29 NOTE — Consult Note (Signed)
[facility] Cardiology  CARDIOLOGY CONSULT NOTE  Patient ID: ZYIERE ROSEMOND MRN: [id number] DOB/AGE: June 26, 1940 80 y.o.  Admit date: 03/28/2020 Referring Physician Pahwani Primary Physician Cr[place]man Primary Cardiologist [facility] Reason for Consultation non-ST elevation myocardial infarction  HPI: 80 year old gentleman referred for evaluation of non-ST elevation myocardial infarction.  Patient without his hearing aids and has difficulty hearing but was able to answer questions appropriately.  Patient presents to [facility] ED following syncopal episode, which occurred at home, in the bathroom, after hitting his forehead and sustaining a hematoma.  The patient is unable to recall events prior to syncopal episode.  He denies chest pain.  He does have chronic dyspnea due to underlying COPD.  ECG reveals sinus rhythm with nonspecific T wave abnormalities anterolaterally.  High-sensitivity troponin is elevated (1890, 2723, 4791).  Patient has known coronary artery disease, with 50% stenosis mid LAD by cardiac catheterization 09/26/2015.  2D echocardiogram 12/16/2019 revealed normal left ventricular function, with LVEF 60 to 65% with mild aortic stenosis with mean gradient 14 mmHg, and peak gradient 26 mmHg.  The patient underwent recent Lexiscan Myoview 03/27/2020 which revealed normal left ventricular function, with LVEF 49%, without evidence for significant ischemia.  The patient was started on heparin drip, however developed gross hematuria which necessitated discontinuing heparin.  Additional pertinent admission labs revealed a count of 17,000.  Urinalysis was positive for any bacteria and WBCs consistent with urinary tract infection.  Review of systems complete and found to be negative unless listed above     Past Medical History:  Diagnosis Date  . Arthritis   . Asthma 2000  . Back pain   . Colon polyp 2012  . COPD (chronic obstructive pulmonary disease) (HCC)   . Emphysema of lung (HCC)   . Heart murmur   .  Hernia 2000  . Hyperlipidemia   . Hypertension   . Personal history of malignant neoplasm of large intestine   . Personal history of tobacco use, presenting hazards to health   . Sleep apnea   . Special screening for malignant neoplasms, colon   . Type 2 diabetes mellitus with proteinuria (HCC) 10/11/2014    Past Surgical History:  Procedure Laterality Date  . CARDIAC CATHETERIZATION Left 09/25/2015   Procedure: Left Heart Cath and Coronary Angiography;  Surgeon: Alwyn Pea, MD;  Location: [facility] INVASIVE CV LAB;  Service: Cardiovascular;  Laterality: Left;  . CHOLECYSTECTOMY  1970  . COLON SURGERY  07-16-99   sigmoid colon resection with primary anastomosis, chemotherapy for metastatic disease  . COLONOSCOPY  2001, 2012   Dr Lemar Livings, tubular adenoma of the cecum and ascending colon in 2012.  Marland Kitchen COLONOSCOPY WITH PROPOFOL N/A 02/12/2016   Procedure: COLONOSCOPY WITH PROPOFOL;  Surgeon: Earline Mayotte, MD;  Location: [facility] ENDOSCOPY;  Service: Endoscopy;  Laterality: N/A;  . ESOPHAGOGASTRODUODENOSCOPY (EGD) WITH PROPOFOL N/A 02/12/2016   Procedure: ESOPHAGOGASTRODUODENOSCOPY (EGD) WITH PROPOFOL;  Surgeon: Earline Mayotte, MD;  Location: [facility] ENDOSCOPY;  Service: Endoscopy;  Laterality: N/A;  . HERNIA REPAIR Right    right inguinal hernia repair  . JOINT REPLACEMENT Bilateral    knee replacement  . KNEE SURGERY Bilateral 2010  . MYRINGOTOMY WITH TUBE PLACEMENT Bilateral 08/16/2014   Procedure: MYRINGOTOMY WITH TUBE PLACEMENT;  Surgeon: Bud Face, MD;  Location: [facility] ORS;  Service: ENT;  Laterality: Bilateral;  . PERIPHERAL VASCULAR CATHETERIZATION Right 09/04/2015   Procedure: Lower Extremity Angiography;  Surgeon: Renford Dills, MD;  Location: [facility] INVASIVE CV LAB;  Service: Cardiovascular;  Laterality: Right;  .  PERIPHERAL VASCULAR CATHETERIZATION  09/04/2015   Procedure: Lower Extremity Intervention;  Surgeon: Katha Cabal, MD;  Location: [facility] CV LAB;   Service: Cardiovascular;;  . TONSILLECTOMY    . TYMPANOSTOMY TUBE PLACEMENT      (Not in a hospital admission)  Social History   Socioeconomic History  . Marital status: Married    Spouse name: Not on file  . Number of children: Not on file  . Years of education: Not on file  . Highest education level: GED or equivalent  Occupational History  . Occupation: retired    Comment: [employer]  Tobacco Use  . Smoking status: Former Smoker    Packs/day: 1.00    Years: 30.00    Pack years: 30.00    Types: Cigarettes    Quit date: 03/23/1989    Years since quitting: 31.0  . Smokeless tobacco: Never Used  Vaping Use  . Vaping Use: Never used  Substance and Sexual Activity  . Alcohol use: No    Alcohol/week: 0.0 standard drinks  . Drug use: No  . Sexual activity: Not on file  Other Topics Concern  . 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 Difficulty of Paying Living Expenses: Not very hard  Food Insecurity: No Food Insecurity  . Worried About Charity fundraiser in the Last Year: Never true  . Ran Out of Food in the Last Year: Never true  Transportation Needs: No Transportation Needs  . Lack of Transportation (Medical): No  . Lack of Transportation (Non-Medical): No  Physical Activity: Inactive  . Days of Exercise per Week: 0 days  . Minutes of Exercise per Session: 0 min  Stress: No Stress Concern Present  . Feeling of Stress : Only a little  Social Connections: Socially Integrated  . Frequency of Communication with Friends and Family: More than three times a week  . Frequency of Social Gatherings with Friends and Family: More than three times a week  . Attends Religious Services: More than 4 times per year  . Active Member of Clubs or Organizations: Yes  . Attends Archivist Meetings: More than 4 times per year  . Marital Status: Married  Human resources officer  Violence: Unknown  . Fear of Current or Ex-Partner: No  . Emotionally Abused: Not on file  . Physically Abused: No  . Sexually Abused: No    Family History  Problem Relation Age of Onset  . Diabetes Father   . Esophageal cancer Mother   . Alzheimer's disease Paternal Uncle       Review of systems complete and found to be negative unless listed above      PHYSICAL EXAM  General: Well developed, well nourished, in no acute distress HEENT:  Normocephalic and atramatic Neck:  No JVD.  Lungs: Clear bilaterally to auscultation and percussion. Heart: HRRR . Normal S1 and S2 without gallops or murmurs.  Abdomen: Bowel sounds are positive, abdomen soft and non-tender  Msk:  Back normal, normal gait. Normal strength and tone for age. Extremities: No clubbing, cyanosis or edema.   Neuro: Alert and oriented X 3. Psych:  Good affect, responds appropriately  Labs:   Lab Results  Component Value Date   WBC 15.1 (H) 03/29/2020   HGB 13.6 03/29/2020   HCT 39.0 03/29/2020   MCV 90.3 03/29/2020   PLT  202 03/29/2020    Recent Labs  Lab 03/29/20 0346  NA 134*  K 3.6  CL 96*  CO2 27  BUN 21  CREATININE 1.85*  CALCIUM 9.4  GLUCOSE 144*   No results found for: CKTOTAL, CKMB, CKMBINDEX, TROPONINI  Lab Results  Component Value Date   CHOL 103 02/02/2020   CHOL 234 (H) 01/01/2020   CHOL 135 12/15/2019   Lab Results  Component Value Date   HDL 26 (L) 02/02/2020   HDL 32 (L) 01/01/2020   HDL 30 (L) 12/15/2019   Lab Results  Component Value Date   LDLCALC 48 02/02/2020   LDLCALC 139 (H) 01/01/2020   LDLCALC 60 12/15/2019   Lab Results  Component Value Date   TRIG 171 (H) 02/02/2020   TRIG 346 (H) 01/01/2020   TRIG 226 (H) 12/15/2019   Lab Results  Component Value Date   CHOLHDL 4.5 12/15/2019   No results found for: LDLDIRECT    Radiology: CT HEAD WO CONTRAST  Result Date: 03/28/2020 CLINICAL DATA:  Head trauma, moderate/severe. Additional history provided:  Syncopal episode with fall and head trauma. EXAM: CT HEAD WITHOUT CONTRAST TECHNIQUE: Contiguous axial images were obtained from the base of the skull through the vertex without intravenous contrast. COMPARISON:  Head CT 01/23/2020.  Brain MRI 12/14/2019. FINDINGS: Brain: Mild cerebral atrophy. Mild ill-defined hypoattenuation within the cerebral white matter is nonspecific, but compatible with chronic small vessel ischemic disease. There is no acute intracranial hemorrhage. No demarcated cortical infarct. No extra-axial fluid collection. No evidence of intracranial mass. No midline shift. Vascular: No hyperdense vessel.  Atherosclerotic calcifications. Skull: Normal. Negative for fracture or focal lesion. Sinuses/Orbits: Visualized orbits show no acute finding. Mild ethmoid sinus mucosal thickening. Bilateral mastoid effusions. Other: Right forehead hematoma. IMPRESSION: No evidence of acute intracranial abnormality. Right forehead hematoma. Mild cerebral atrophy and chronic small vessel ischemic disease. Mild ethmoid sinus mucosal thickening. Bilateral mastoid effusions. Electronically Signed   By: Jackey Loge DO   On: 03/28/2020 16:21   DG Chest Portable 1 View  Result Date: 03/28/2020 CLINICAL DATA:  Acute shortness of breath. EXAM: PORTABLE CHEST 1 VIEW COMPARISON:  01/23/2020 FINDINGS: Cardiomediastinal silhouette is unchanged. Pulmonary vascular congestion is present. Peribronchial thickening and LEFT basilar atelectasis/scarring again noted. There is no evidence of focal airspace disease, pulmonary edema, suspicious pulmonary nodule/mass, pleural effusion, or pneumothorax. No acute bony abnormalities are identified. IMPRESSION: Pulmonary vascular congestion. Electronically Signed   By: Harmon Pier M.D.   On: 03/28/2020 18:20    EKG: Normal sinus rhythm with nonspecific T wave abnormalities anterolaterally  ASSESSMENT AND PLAN:   1.  Non-ST elevation myocardial infarction, with elevated  high-sensitivity troponin (1890, 2000 723, 4000 7091), with abnormal ECG, in the absence of chest pain.  Heparin discontinued due to gross hematuria. 2.  Mild aortic stenosis of uncertain clinical significance 3.  Gross hematuria, exacerbated by heparin drip, with probable UTI/urosepsis 4.  Syncope, of uncertain etiology, currently in sinus rhythm 5.  COPD O2 by nasal cannula  Recommendations  1.  Agree holding heparin drip for now in setting of gross hematuria 2.  Defer urgent cardiac catheterization in the absence of chest pain, and in the presence of gross hematuria on heparin drip 3.  Continue aspirin and carvedilol 4.  Continue rosuvastatin 5.  Resume olmesartan as needed 6.  Hold clonidine for now 7.  Review 2D echocardiogram 8.  Further recommendations pending patient's initial clinical course and 2D echocardiogram results  Signed: Marcina Millard  MD,PhD, [facility] 03/29/2020, 8:21 AM

## 2020-03-29 NOTE — Progress Notes (Signed)
PROGRESS NOTE    Justin Griffith  DJM:[id number] DOB: [birth year] DOA: 03/28/2020 PCP: Guadalupe Maple, MD   Brief Narrative:  Patient is 80 year old male with past medical history significant of osteoarthritis, asthma, chronic back pain, colon polyp, COPD, emphysema of the lung, heart murmur, hernia, hyperlipidemia, hypertension, colon cancer, tobacco abuse, sleep apnea-on CPAP, type 2 diabetes mellitus with proteinuria presents to emergency department with syncopal episode at home while in the bathroom hitting his forehead, sustaining a hematoma in the area and one episode of emesis.  ED course: Afebrile, vital signs within normal limits, maintaining oxygen saturation 92% on room air, EKG shows ST-T wave changes on anterior lateral leads.  Troponin was 1890 and then 2723, BNP: 256, UA shows pyuria and bacteriuria.  CBC showed a white count of 17,000.  Hemoglobin 13.7, platelet: 198, PT/INR: WNL sodium 130, potassium 3.4, renal function within normal limit.  Chest x-ray shows pulmonary vascular congestion, peribronchial thickening and left basilar atelectasis/scarring again noted.  CT head did not show any acute findings.  Cardiology consulted and patient admitted for further evaluation and management.  Assessment & Plan:   NSTEMI: -Patient presented with syncopal episode with elevated troponin.  Reviewed EKG. -Patient started on heparin gtt in ED which was discontinued later due to frank hematuria. -Cardiology consulted-appreciate input recommended for urgent cardiac catheterization in the absence of chest pain and in the presence of gross hematuria on heparin drip. -Continue aspirin, statin, Coreg. -2D echo is pending. -Monitor vitals closely.  Syncope: -CT head negative for acute findings.  Carotid Doppler and 2D echo: Pending. -On fall precautions.  UTI/gross materia: -UA concerning for infection.  Patient started on Rocephin in ED.  Urine culture is pending.  Patient has  leukocytosis.  He is afebrile. -CT renal study: Negative for nephrolithiasis or hydronephrosis.  DC heparin GTT. -We will consult urology for further management.  AKI: Likely in the setting of dehydration and infection.  Continue monitor closely.  Continue to hold nephrotoxic medication and monitor kidney function closely.  Hypertension: -Continue Coreg.  Continue to hold alpha-blocker due to syncopal episode.  Continue to hold olmesartan and clonidine due to soft blood pressure. -Continue to monitor blood pressure closely  Hyponatremia: Sodium level improved from 1 30-1 34.  Continue to monitor  Hypokalemia: Replenished.  BPH: Flomax is on hold due to syncopal episode.  COPD: No wheezing noted on exam.  Continue supplemental oxygen as needed.  Continue bronchodilators as needed  Memory changes: Continue donepezil  Morbid obesity with BMI of 39: -Diet modification/exercise and weight loss recommended.  Type 2 diabetes mellitus: Check A1c test pending. -We will hold Metformin.  Start patient on sliding scale insulin and monitor blood sugar closely.  Hypophosphatemia: Continue K-Phos 500 mg p.o. 3 times daily for 3 doses.  Repeat phosphate level tomorrow AM.  DVT prophylaxis: SCD, no chemical anticoagulation due to frank hematuria Code Status: Full code Family Communication:  None present at bedside.  Plan of care discussed with patient in length and he verbalized understanding and agreed with it.  I called patient's wife twice with no response.  I called patient's daughter and discussed plan of care and she verbalized understanding.  I also discussed about CODE STATUS-she tells me that she will discuss with her dad and will let us know as soon as possible.  Disposition Plan: To be determined  Consultants:   Cardiology  Neurology  Procedures:  CT renal study Antimicrobials:   Rocephin  Status is: Observation Dispo: The  patient is from: Home              Anticipated d/c  is to: SNF              Anticipated d/c date is: 2 days              Patient currently is not medically stable to d/c.         Subjective: Patient seen and examined in the ED.  Sitting comfortably on the recliner.  Appears sick and weak.  Tells me that he feels weak and dizzy.  Denies chest pain, shortness of breath, palpitation, nausea or vomiting.  Objective: Vitals:   03/29/20 0756 03/29/20 0757 03/29/20 0758 03/29/20 0759  BP:      Pulse: 68 66 68 66  Resp: (!) 27 (!) 25 (!) 23 (!) 22  Temp:      TempSrc:      SpO2: 90% (!) 87% 91% 94%  Weight:      Height:        Intake/Output Summary (Last 24 hours) at 03/29/2020 1035 Last data filed at 03/29/2020 [id number] Gross per 24 hour  Intake 292.82 ml  Output --  Net 292.82 ml   Filed Weights   03/28/20 1526  Weight: 125.2 kg    Examination:  General exam: Appears calm and comfortable, appears weak, lethargic and sick.  On 2 L of oxygen via nasal cannula.  Frank blood noted in urine container.  Hard of hearing. Respiratory system: Clear to auscultation. Respiratory effort normal. Cardiovascular system: S1 & S2 heard, RRR. No JVD, murmurs, rubs, gallops or clicks. No pedal edema. Gastrointestinal system: Abdomen is nondistended, soft and nontender. No organomegaly or masses felt. Normal bowel sounds heard. Central nervous system: Alert and oriented. No focal neurological deficits. Extremities: Symmetric 5 x 5 power. Skin: No rashes, lesions or ulcers Psychiatry: Judgement and insight appear normal. Mood & affect appropriate.    Data Reviewed: I have personally reviewed following labs and imaging studies  CBC: Recent Labs  Lab 03/28/20 1535 03/29/20 0346  WBC 17.0* 15.1*  HGB 13.7 13.6  HCT 39.8 39.0  MCV 90.0 90.3  PLT 198 [id number]   Basic Metabolic Panel: Recent Labs  Lab 03/28/20 1535 03/29/20 0346  NA 130* 134*  K 3.4* 3.6  CL 93* 96*  CO2 25 27  GLUCOSE 214* 144*  BUN 16 21  CREATININE 1.08 1.85*  CALCIUM  9.5 9.4  MG 1.2*  --   PHOS 2.3*  --    GFR: Estimated Creatinine Clearance: 42.3 mL/min (A) (by C-G formula based on SCr of 1.85 mg/dL (H)). Liver Function Tests: No results for input(s): AST, ALT, ALKPHOS, BILITOT, PROT, ALBUMIN in the last 168 hours. No results for input(s): LIPASE, AMYLASE in the last 168 hours. No results for input(s): AMMONIA in the last 168 hours. Coagulation Profile: Recent Labs  Lab 03/28/20 1818  INR 1.2   Cardiac Enzymes: No results for input(s): CKTOTAL, CKMB, CKMBINDEX, TROPONINI in the last 168 hours. BNP (last 3 results) No results for input(s): PROBNP in the last 8760 hours. HbA1C: No results for input(s): HGBA1C in the last 72 hours. CBG: Recent Labs  Lab 03/29/20 0850  GLUCAP 193*   Lipid Profile: No results for input(s): CHOL, HDL, LDLCALC, TRIG, CHOLHDL, LDLDIRECT in the last 72 hours. Thyroid Function Tests: No results for input(s): TSH, T4TOTAL, FREET4, T3FREE, THYROIDAB in the last 72 hours. Anemia Panel: No results for input(s): VITAMINB12, FOLATE, FERRITIN, TIBC, IRON,  RETICCTPCT in the last 72 hours. Sepsis Labs: Recent Labs  Lab 03/28/20 1818  LATICACIDVEN 1.8    Recent Results (from the past 240 hour(s))  Resp Panel by RT-PCR (Flu A&B, Covid) Nasopharyngeal Swab     Status: None   Collection Time: 03/28/20  6:18 PM   Specimen: Nasopharyngeal Swab; Nasopharyngeal(NP) swabs in vial transport medium  Result Value Ref Range Status   SARS Coronavirus 2 by RT PCR NEGATIVE NEGATIVE Final    Comment: (NOTE) SARS-CoV-2 target nucleic acids are NOT DETECTED.  The SARS-CoV-2 RNA is generally detectable in upper respiratory specimens during the acute phase of infection. The lowest concentration of SARS-CoV-2 viral copies this assay can detect is 138 copies/mL. A negative result does not preclude SARS-Cov-2 infection and should not be used as the sole basis for treatment or other patient management decisions. A negative result may  occur with  improper specimen collection/handling, submission of specimen other than nasopharyngeal swab, presence of viral mutation(s) within the areas targeted by this assay, and inadequate number of viral copies(<138 copies/mL). A negative result must be combined with clinical observations, patient history, and epidemiological information. The expected result is Negative.  Fact Sheet for Patients:  EntrepreneurPulse.com.au  Fact Sheet for Healthcare Providers:  IncredibleEmployment.be  This test is no t yet approved or cleared by the [place] FDA and  has been authorized for detection and/or diagnosis of SARS-CoV-2 by FDA under an Emergency Use Authorization (EUA). This EUA will remain  in effect (meaning this test can be used) for the duration of the COVID-19 declaration under Section 564(b)(1) of the Act, 21 U.S.C.section 360bbb-3(b)(1), unless the authorization is terminated  or revoked sooner.       Influenza A by PCR NEGATIVE NEGATIVE Final   Influenza B by PCR NEGATIVE NEGATIVE Final    Comment: (NOTE) The Xpert Xpress SARS-CoV-2/FLU/RSV plus assay is intended as an aid in the diagnosis of influenza from Nasopharyngeal swab specimens and should not be used as a sole basis for treatment. Nasal washings and aspirates are unacceptable for Xpert Xpress SARS-CoV-2/FLU/RSV testing.  Fact Sheet for Patients: EntrepreneurPulse.com.au  Fact Sheet for Healthcare Providers: IncredibleEmployment.be  This test is not yet approved or cleared by the [place] FDA and has been authorized for detection and/or diagnosis of SARS-CoV-2 by FDA under an Emergency Use Authorization (EUA). This EUA will remain in effect (meaning this test can be used) for the duration of the COVID-19 declaration under Section 564(b)(1) of the Act, 21 U.S.C. section 360bbb-3(b)(1), unless the authorization is terminated  or revoked.  Performed at [facility], [address], [place], [place] [address]       Radiology Studies: CT HEAD WO CONTRAST  Result Date: 03/28/2020 CLINICAL DATA:  Head trauma, moderate/severe. Additional history provided: Syncopal episode with fall and head trauma. EXAM: CT HEAD WITHOUT CONTRAST TECHNIQUE: Contiguous axial images were obtained from the base of the skull through the vertex without intravenous contrast. COMPARISON:  Head CT 01/23/2020.  Brain MRI 12/14/2019. FINDINGS: Brain: Mild cerebral atrophy. Mild ill-defined hypoattenuation within the cerebral white matter is nonspecific, but compatible with chronic small vessel ischemic disease. There is no acute intracranial hemorrhage. No demarcated cortical infarct. No extra-axial fluid collection. No evidence of intracranial mass. No midline shift. Vascular: No hyperdense vessel.  Atherosclerotic calcifications. Skull: Normal. Negative for fracture or focal lesion. Sinuses/Orbits: Visualized orbits show no acute finding. Mild ethmoid sinus mucosal thickening. Bilateral mastoid effusions. Other: Right forehead hematoma. IMPRESSION: No evidence of acute  intracranial abnormality. Right forehead hematoma. Mild cerebral atrophy and chronic small vessel ischemic disease. Mild ethmoid sinus mucosal thickening. Bilateral mastoid effusions. Electronically Signed   By: Kellie Simmering DO   On: 03/28/2020 16:21   DG Chest Portable 1 View  Result Date: 03/28/2020 CLINICAL DATA:  Acute shortness of breath. EXAM: PORTABLE CHEST 1 VIEW COMPARISON:  01/23/2020 FINDINGS: Cardiomediastinal silhouette is unchanged. Pulmonary vascular congestion is present. Peribronchial thickening and LEFT basilar atelectasis/scarring again noted. There is no evidence of focal airspace disease, pulmonary edema, suspicious pulmonary nodule/mass, pleural effusion, or pneumothorax. No acute bony abnormalities are identified. IMPRESSION: Pulmonary vascular congestion.  Electronically Signed   By: Margarette Canada M.D.   On: 03/28/2020 18:20   CT RENAL STONE STUDY  Result Date: 03/29/2020 CLINICAL DATA:  Hematuria with unknown cause EXAM: CT ABDOMEN AND PELVIS WITHOUT CONTRAST TECHNIQUE: Multidetector CT imaging of the abdomen and pelvis was performed following the standard protocol without IV contrast. COMPARISON:  01/23/2020 FINDINGS: Lower chest: 7 mm right lower lobe pulmonary nodule, benign based on previous imaging. Hepatobiliary: No focal liver abnormality.Absent gallbladder. No bile duct dilatation. Pancreas: Unremarkable. Spleen: Unremarkable. Adrenals/Urinary Tract: Negative adrenals. No hydronephrosis or stone. Chronic perinephric stranding, nonspecific. Thick walled appearance of the bladder without over distension, likely chronic outlet obstruction given the prostate. Stomach/Bowel: No obstruction. No appendicitis. Postoperative colon with rectosigmoid anastomosis. Vascular/Lymphatic: No acute vascular abnormality. Diffuse atheromatous calcification. No mass or adenopathy. Reproductive:Enlarged prostate up lifting the bladder base. Other: No ascites or pneumoperitoneum. Musculoskeletal: No acute abnormalities. Advanced, generalized spinal degeneration. IMPRESSION: 1. No acute finding.  No hydronephrosis or urolithiasis. 2. Enlarged prostate with thick walled bladder suggesting chronic outlet obstruction. No bladder dilatation or visible clot. Electronically Signed   By: Monte Fantasia M.D.   On: 03/29/2020 08:49    Scheduled Meds: . aspirin EC  81 mg Oral Daily  . carvedilol  12.5 mg Oral BID WC  . insulin aspart  0-15 Units Subcutaneous TID WC  . phosphorus  500 mg Oral TID   Continuous Infusions: . cefTRIAXone (ROCEPHIN)  IV Stopped (03/29/20 0551)  . heparin Stopped (03/29/20 0637)     LOS: 0 days   Time spent: 40 minutes   Sequoyah Counterman Loann Quill, MD [facility]  If 7PM-7AM, please contact night-coverage [email or web address] 03/29/2020, 10:35 AM

## 2020-03-29 NOTE — Telephone Encounter (Signed)
  Chronic Care Management   Outreach Note  03/29/2020 Name: Justin Griffith MRN: [id number] DOB: 1940/06/06  Referred by: Justin Maple, MD Reason for referral : Appointment (RNCM: Follow up call- 2nd attempt for Chronic Disease Management and Care Coordination Needs)   A second unsuccessful telephone outreach was attempted today. The patient was referred to the case management team for assistance with care management and care coordination. The patients wife answered the phone and states the patient is currently in the hospital being evaluated. Will anticipate follow up after the patient is out of the hospital.   Follow Up Plan: The care management team will reach out to the patient again over the next 30 to 60 days.   Noreene Larsson RN, MSN, [facility] Mobile: [phone]

## 2020-03-29 NOTE — ED Notes (Signed)
Pt given meal tray.

## 2020-03-29 NOTE — Progress Notes (Signed)
ANTICOAGULATION CONSULT NOTE - Initial Consult  Pharmacy Consult for Heparin  Indication: chest pain/ACS  Allergies  Allergen Reactions  . Farxiga [Dapagliflozin] Rash  . Jardiance [Empagliflozin] Rash    Patient Measurements: Height: 5\' 10"  (177.8 cm) Weight: 125.2 kg (276 lb) IBW/kg (Calculated) : 73 Heparin Dosing Weight:  101.4 kg   Vital Signs: BP: 115/62 (01/07 0330) Pulse Rate: 58 (01/07 0330)  Labs: Recent Labs    03/28/20 1535 03/28/20 1738 03/28/20 1818 03/29/20 0346  HGB 13.7  --   --  13.6  HCT 39.8  --   --  39.0  PLT 198  --   --  202  APTT  --   --  30  --   LABPROT  --   --  14.8  --   INR  --   --  1.2  --   HEPARINUNFRC  --   --   --  0.11*  CREATININE 1.08  --   --  1.85*  TROPONINIHS 1,890* 2,723*  --   --     Estimated Creatinine Clearance: 42.3 mL/min (A) (by C-G formula based on SCr of 1.85 mg/dL (H)).   Medical History: Past Medical History:  Diagnosis Date  . Arthritis   . Asthma 2000  . Back pain   . Colon polyp 2012  . COPD (chronic obstructive pulmonary disease) ([place])   . Emphysema of lung ([place])   . Heart murmur   . Hernia 2000  . Hyperlipidemia   . Hypertension   . Personal history of malignant neoplasm of large intestine   . Personal history of tobacco use, presenting hazards to health   . Sleep apnea   . Special screening for malignant neoplasms, colon   . Type 2 diabetes mellitus with proteinuria ([place]) 10/11/2014    Medications:  (Not in a hospital admission)   Assessment: Pharmacy consulted to dose heparin in this 80 year old male presenting with ACS/NSTEMI.  No prior anticoag noted.  CrCl = 72.5 ml/min  Goal of Therapy:  Heparin level 0.3-0.7 units/ml Monitor platelets by anticoagulation protocol: Yes   11/07 0346 HL = 0.11, subtherapeutic   Plan:  Give 3000 units bolus x 1 Start heparin infusion at 1700 units/hr Check anti-Xa level in 8 hours and daily while on heparin Continue to monitor H&H and  platelets  Renda Rolls, PharmD, [facility] 03/29/2020 5:23 AM

## 2020-03-29 NOTE — ED Notes (Signed)
Pt resting comfortably with cpap in place.  Pt easy to wake, oxygen saturation increases to 93% when woken, BP increased as well.

## 2020-03-29 NOTE — ED Notes (Signed)
Called to pts room - pt c/o nausea. No emesis noted. Pt given zofran iv per prn order.

## 2020-03-29 NOTE — Consult Note (Signed)
Urology Consult  I have been asked to see the patient by Dr. Doristine Bosworth, for evaluation and management of gross hematuria.  Chief Complaint: syncope with fall  History of Present Illness: Justin Griffith is a 80 y.o. year old male BPH on Flomax who presented to the ED yesterday after syncopal event.  Troponin acutely elevated, he was diagnosed with NSTEMI and started on heparin drip.  Subsequently, he developed gross hematuria and urology was consulted for further evaluation.  Heparin drip discontinued.  Admission labs notable for UA with >50 WBCs/hpf, many bacteria, and WBC clumps; creatinine 1.08 (baseline 1.0-1.1); WBC count 17.0.  Urine culture pending, on antibiotics as below.  Patient is known to our clinic with a recent history of hospitalization-associated urinary retention and gross hematuria with Foley trauma.  He had undergone CT chest abdomen and pelvis with contrast on 01/23/2020 with findings of prostatomegaly without evidence of urinary malignancy.  Cystoscopy was initially deferred when he was not found to have persistent microscopic hematuria, however patient returned to our clinic approximately 1 month ago with ongoing reports of blood in his diaper believed to be originating from the urethra.  At that point, he was scheduled for cystoscopy with Dr. Erlene Quan for further evaluation.  This is scheduled for 04/03/2020.  He underwent CT stone study today which again demonstrated prostatomegaly and bladder wall thickening consistent with bladder outlet obstruction.  No urolithiasis or mass or clot visualized within the bladder.  Today he reports an approximate 1 year history of dysuria.  He states he wishes to cancel his upcoming cystoscopy appointment because he "has a lot going on right now."  He reports feeling confused and states he has a headache.  Anti-infectives (From admission, onward)   Start     Dose/Rate Route Frequency Ordered Stop   03/29/20 0400  cefTRIAXone (ROCEPHIN) 2  g in sodium chloride 0.9 % 100 mL IVPB        2 g 200 mL/hr over 30 Minutes Intravenous Every 24 hours 03/29/20 0351         Past Medical History:  Diagnosis Date  . Arthritis   . Asthma 2000  . Back pain   . Colon polyp 2012  . COPD (chronic obstructive pulmonary disease) ([place])   . Emphysema of lung ([place])   . Heart murmur   . Hernia 2000  . Hyperlipidemia   . Hypertension   . Personal history of malignant neoplasm of large intestine   . Personal history of tobacco use, presenting hazards to health   . Sleep apnea   . Special screening for malignant neoplasms, colon   . Type 2 diabetes mellitus with proteinuria ([place]) 10/11/2014    Past Surgical History:  Procedure Laterality Date  . CARDIAC CATHETERIZATION Left 09/25/2015   Procedure: Left Heart Cath and Coronary Angiography;  Surgeon: Yolonda Kida, MD;  Location: [facility];  Service: Cardiovascular;  Laterality: Left;  . CHOLECYSTECTOMY  1970  . COLON SURGERY  07-16-99   sigmoid colon resection with primary anastomosis, chemotherapy for metastatic disease  . COLONOSCOPY  2001, 2012   Dr Bary Castilla, tubular adenoma of the cecum and ascending colon in 2012.  Marland Kitchen COLONOSCOPY WITH PROPOFOL N/A 02/12/2016   Procedure: COLONOSCOPY WITH PROPOFOL;  Surgeon: Robert Bellow, MD;  Location: [facility] ENDOSCOPY;  Service: Endoscopy;  Laterality: N/A;  . ESOPHAGOGASTRODUODENOSCOPY (EGD) WITH PROPOFOL N/A 02/12/2016   Procedure: ESOPHAGOGASTRODUODENOSCOPY (EGD) WITH PROPOFOL;  Surgeon: Robert Bellow, MD;  Location: [facility]  ENDOSCOPY;  Service: Endoscopy;  Laterality: N/A;  . HERNIA REPAIR Right    right inguinal hernia repair  . JOINT REPLACEMENT Bilateral    knee replacement  . KNEE SURGERY Bilateral 2010  . MYRINGOTOMY WITH TUBE PLACEMENT Bilateral 08/16/2014   Procedure: MYRINGOTOMY WITH TUBE PLACEMENT;  Surgeon: Carloyn Manner, MD;  Location: [facility] ORS;  Service: ENT;  Laterality: Bilateral;  . PERIPHERAL VASCULAR  CATHETERIZATION Right 09/04/2015   Procedure: Lower Extremity Angiography;  Surgeon: Katha Cabal, MD;  Location: [facility];  Service: Cardiovascular;  Laterality: Right;  . PERIPHERAL VASCULAR CATHETERIZATION  09/04/2015   Procedure: Lower Extremity Intervention;  Surgeon: Katha Cabal, MD;  Location: [facility];  Service: Cardiovascular;;  . TONSILLECTOMY    . TYMPANOSTOMY TUBE PLACEMENT      Home Medications:  Current Meds  Medication Sig  . acyclovir (ZOVIRAX) 400 MG tablet Take 400 mg by mouth 2 (two) times daily.  Marland Kitchen aspirin EC 325 MG EC tablet Take 1 tablet (325 mg total) by mouth daily.  Marland Kitchen azelastine (ASTELIN) 0.1 % nasal spray Place 2 sprays into both nostrils 2 (two) times daily.   . carvedilol (COREG) 12.5 MG tablet Take 2 tablets (25 mg total) by mouth 2 (two) times daily with a meal.  . clobetasol cream (TEMOVATE) 8.67 % Apply 1 application topically 2 (two) times daily. Apply to bilateral lower legs.  . cloNIDine (CATAPRES) 0.2 MG tablet Take 1 tablet (0.2 mg total) by mouth 2 (two) times daily.  Marland Kitchen donepezil (ARICEPT) 5 MG tablet Take 1 tablet (5 mg total) by mouth at bedtime.  . furosemide (LASIX) 20 MG tablet Take 1 tablet (20 mg total) by mouth daily.  . insulin glargine (LANTUS) 100 UNIT/ML injection Inject 0.25 mLs (25 Units total) into the skin daily.  Marland Kitchen ipratropium-albuterol (DUONEB) 0.5-2.5 (3) MG/3ML SOLN Take 3 mLs by nebulization every 6 (six) hours.  . metFORMIN (GLUCOPHAGE) 1000 MG tablet Take 1,000 mg by mouth 2 (two) times daily with a meal.  . mupirocin ointment (BACTROBAN) 2 % Apply 1 application topically 2 (two) times daily. To open blisters bilateral legs.  Marland Kitchen olmesartan (BENICAR) 40 MG tablet Take 1 tablet (40 mg total) by mouth daily.  . primidone (MYSOLINE) 50 MG tablet Take 1 tablet (50 mg total) by mouth 2 (two) times daily. (Patient taking differently: Take 100 mg by mouth 2 (two) times daily.)  . QVAR REDIHALER 40 MCG/ACT  inhaler 1 puff 2 (two) times daily.   . rosuvastatin (CRESTOR) 40 MG tablet Take 1 tablet (40 mg total) by mouth at bedtime.  . tamsulosin (FLOMAX) 0.4 MG CAPS capsule Take 0.4 mg by mouth daily. Patient restarted on his own due to trouble urinating  . vitamin B-12 (CYANOCOBALAMIN) 500 MCG tablet Take 2,000 mcg by mouth at bedtime.  . Vitamin D, Cholecalciferol, 25 MCG (1000 UT) CAPS Take 2,000 mcg by mouth at bedtime.    Allergies:  Allergies  Allergen Reactions  . Farxiga [Dapagliflozin] Rash  . Jardiance [Empagliflozin] Rash    Family History  Problem Relation Age of Onset  . Diabetes Father   . Esophageal cancer Mother   . Alzheimer's disease Paternal Uncle     Social History:  reports that he quit smoking about 31 years ago. His smoking use included cigarettes. He has a 30.00 pack-year smoking history. He has never used smokeless tobacco. He reports that he does not drink alcohol and does not use drugs.  ROS: A complete  review of systems was performed.  All systems are negative except for pertinent findings as noted.  Physical Exam:  Vital signs in last 24 hours: Temp:  [98.8 F (37.1 C)] 98.8 F (37.1 C) (01/06 1525) Pulse Rate:  [55-76] 66 (01/07 0759) Resp:  [10-30] 22 (01/07 0759) BP: (86-142)/(53-75) 123/69 (01/07 0730) SpO2:  [87 %-97 %] 94 % (01/07 0759) Weight:  [125.2 kg] 125.2 kg (01/06 1526) Constitutional: Awake, no acute distress HEENT: [place] AT, moist mucus membranes, hard of hearing.  Cardiovascular: No clubbing, cyanosis, or edema. Respiratory: Normal respiratory effort GI: Protuberant abdomen Skin: No rashes, bruises or suspicious lesions  Laboratory Data:  Recent Labs    03/28/20 1535 03/29/20 0346  WBC 17.0* 15.1*  HGB 13.7 13.6  HCT 39.8 39.0   Recent Labs    03/28/20 1535 03/29/20 0346  NA 130* 134*  K 3.4* 3.6  CL 93* 96*  CO2 25 27  GLUCOSE 214* 144*  BUN 16 21  CREATININE 1.08 1.85*  CALCIUM 9.5 9.4   Recent Labs     03/28/20 1818  INR 1.2   Urinalysis    Component Value Date/Time   COLORURINE AMBER (A) 03/28/2020 2019   APPEARANCEUR CLOUDY (A) 03/28/2020 2019   APPEARANCEUR Cloudy (A) 02/29/2020 1046   LABSPEC 1.016 03/28/2020 2019   PHURINE 5.0 03/28/2020 2019   GLUCOSEU NEGATIVE 03/28/2020 2019   HGBUR SMALL (A) 03/28/2020 2019   BILIRUBINUR NEGATIVE 03/28/2020 2019   BILIRUBINUR Negative 02/29/2020 1046   KETONESUR NEGATIVE 03/28/2020 2019   PROTEINUR >=300 (A) 03/28/2020 2019   NITRITE NEGATIVE 03/28/2020 2019   LEUKOCYTESUR MODERATE (A) 03/28/2020 2019   Results for orders placed or performed during the hospital encounter of 03/28/20  Resp Panel by RT-PCR (Flu A&B, Covid) Nasopharyngeal Swab     Status: None   Collection Time: 03/28/20  6:18 PM   Specimen: Nasopharyngeal Swab; Nasopharyngeal(NP) swabs in vial transport medium  Result Value Ref Range Status   SARS Coronavirus 2 by RT PCR NEGATIVE NEGATIVE Final    Comment: (NOTE) SARS-CoV-2 target nucleic acids are NOT DETECTED.  The SARS-CoV-2 RNA is generally detectable in upper respiratory specimens during the acute phase of infection. The lowest concentration of SARS-CoV-2 viral copies this assay can detect is 138 copies/mL. A negative result does not preclude SARS-Cov-2 infection and should not be used as the sole basis for treatment or other patient management decisions. A negative result may occur with  improper specimen collection/handling, submission of specimen other than nasopharyngeal swab, presence of viral mutation(s) within the areas targeted by this assay, and inadequate number of viral copies(<138 copies/mL). A negative result must be combined with clinical observations, patient history, and epidemiological information. The expected result is Negative.  Fact Sheet for Patients:  EntrepreneurPulse.com.au  Fact Sheet for Healthcare Providers:  IncredibleEmployment.be  This  test is no t yet approved or cleared by the [place] FDA and  has been authorized for detection and/or diagnosis of SARS-CoV-2 by FDA under an Emergency Use Authorization (EUA). This EUA will remain  in effect (meaning this test can be used) for the duration of the COVID-19 declaration under Section 564(b)(1) of the Act, 21 U.S.C.section 360bbb-3(b)(1), unless the authorization is terminated  or revoked sooner.       Influenza A by PCR NEGATIVE NEGATIVE Final   Influenza B by PCR NEGATIVE NEGATIVE Final    Comment: (NOTE) The Xpert Xpress SARS-CoV-2/FLU/RSV plus assay is intended as an aid in the diagnosis of influenza  from Nasopharyngeal swab specimens and should not be used as a sole basis for treatment. Nasal washings and aspirates are unacceptable for Xpert Xpress SARS-CoV-2/FLU/RSV testing.  Fact Sheet for Patients: EntrepreneurPulse.com.au  Fact Sheet for Healthcare Providers: IncredibleEmployment.be  This test is not yet approved or cleared by the [place] FDA and has been authorized for detection and/or diagnosis of SARS-CoV-2 by FDA under an Emergency Use Authorization (EUA). This EUA will remain in effect (meaning this test can be used) for the duration of the COVID-19 declaration under Section 564(b)(1) of the Act, 21 U.S.C. section 360bbb-3(b)(1), unless the authorization is terminated or revoked.  Performed at [facility], [address] [place], [place] [address]     Radiologic Imaging: CT RENAL STONE STUDY  Result Date: 03/29/2020 CLINICAL DATA:  Hematuria with unknown cause EXAM: CT ABDOMEN AND PELVIS WITHOUT CONTRAST TECHNIQUE: Multidetector CT imaging of the abdomen and pelvis was performed following the standard protocol without IV contrast. COMPARISON:  01/23/2020 FINDINGS: Lower chest: 7 mm right lower lobe pulmonary nodule, benign based on previous imaging. Hepatobiliary: No focal liver  abnormality.Absent gallbladder. No bile duct dilatation. Pancreas: Unremarkable. Spleen: Unremarkable. Adrenals/Urinary Tract: Negative adrenals. No hydronephrosis or stone. Chronic perinephric stranding, nonspecific. Thick walled appearance of the bladder without over distension, likely chronic outlet obstruction given the prostate. Stomach/Bowel: No obstruction. No appendicitis. Postoperative colon with rectosigmoid anastomosis. Vascular/Lymphatic: No acute vascular abnormality. Diffuse atheromatous calcification. No mass or adenopathy. Reproductive:Enlarged prostate up lifting the bladder base. Other: No ascites or pneumoperitoneum. Musculoskeletal: No acute abnormalities. Advanced, generalized spinal degeneration. IMPRESSION: 1. No acute finding.  No hydronephrosis or urolithiasis. 2. Enlarged prostate with thick walled bladder suggesting chronic outlet obstruction. No bladder dilatation or visible clot. Electronically Signed   By: Monte Fantasia M.D.   On: 03/29/2020 08:49   Assessment & Plan:  80 year old male with BPH and a recent history of gross hematuria awaiting cystoscopy with Dr. Erlene Quan next week admitted yesterday with NSTEMI who developed recurrent versus worsening hematuria in the setting of heparin drip, now discontinued.  Admission UA grossly infected.  With his known prostatomegaly, prostatic origin of bleeding is most likely.  Regardless, it remains necessary for [place] to proceed with plans for cystoscopy.  I urged the patient not to cancel his upcoming cystoscopy appointment.  He expressed understanding.  Recommendations: -Continue to hold anticoagulation to clear urine -Continue empiric antibiotics and follow urine cultures with plans for 10 days of culture appropriate therapy -Keep outpatient cystoscopy appointment with Dr. Erlene Quan on 04/03/2020  Thank you for involving me in this patient's care,please page with any further questions or concerns.  Debroah Loop,  PA-C 03/29/2020 12:36 PM

## 2020-03-29 NOTE — Progress Notes (Signed)
*  PRELIMINARY RESULTS* Echocardiogram 2D Echocardiogram has been performed.  Justin Griffith Justin Griffith 03/29/2020, 12:10 PM

## 2020-03-29 NOTE — ED Notes (Signed)
Pt used call bell. Pt repositioned as requested.

## 2020-03-30 DIAGNOSIS — I214 Non-ST elevation (NSTEMI) myocardial infarction: Secondary | ICD-10-CM | POA: Diagnosis not present

## 2020-03-30 LAB — GLUCOSE, CAPILLARY
Glucose-Capillary: 185 mg/dL — ABNORMAL HIGH (ref 70–99)
Glucose-Capillary: 166 mg/dL — ABNORMAL HIGH (ref 70–99)
Glucose-Capillary: 178 mg/dL — ABNORMAL HIGH (ref 70–99)

## 2020-03-30 LAB — CBC
HCT: 36.8 % — ABNORMAL LOW (ref 39.0–52.0)
Hemoglobin: 12.6 g/dL — ABNORMAL LOW (ref 13.0–17.0)
MCH: 31 pg (ref 26.0–34.0)
MCHC: 34.2 g/dL (ref 30.0–36.0)
MCV: 90.4 fL (ref 80.0–100.0)
Platelets: 229 10*3/uL (ref 150–400)
RBC: 4.07 MIL/uL — ABNORMAL LOW (ref 4.22–5.81)
RDW: 13.2 % (ref 11.5–15.5)
WBC: 10.6 10*3/uL — ABNORMAL HIGH (ref 4.0–10.5)
nRBC: 0 % (ref 0.0–0.2)

## 2020-03-30 LAB — COMPREHENSIVE METABOLIC PANEL
ALT: 20 U/L (ref 0–44)
AST: 33 U/L (ref 15–41)
Albumin: 2.7 g/dL — ABNORMAL LOW (ref 3.5–5.0)
Alkaline Phosphatase: 45 U/L (ref 38–126)
Anion gap: 11 (ref 5–15)
BUN: 28 mg/dL — ABNORMAL HIGH (ref 8–23)
CO2: 27 mmol/L (ref 22–32)
Calcium: 9.1 mg/dL (ref 8.9–10.3)
Chloride: 96 mmol/L — ABNORMAL LOW (ref 98–111)
Creatinine, Ser: 1.35 mg/dL — ABNORMAL HIGH (ref 0.61–1.24)
GFR, Estimated: 53 mL/min — ABNORMAL LOW (ref >60)
Glucose, Bld: 135 mg/dL — ABNORMAL HIGH (ref 70–99)
Potassium: 3.3 mmol/L — ABNORMAL LOW (ref 3.5–5.1)
Sodium: 134 mmol/L — ABNORMAL LOW (ref 135–145)
Total Bilirubin: 1 mg/dL (ref 0.3–1.2)
Total Protein: 6.6 g/dL (ref 6.5–8.1)

## 2020-03-30 LAB — PHOSPHORUS: Phosphorus: 4.1 mg/dL (ref 2.5–4.6)

## 2020-03-30 LAB — CBG MONITORING, ED: Glucose-Capillary: 160 mg/dL — ABNORMAL HIGH (ref 70–99)

## 2020-03-30 LAB — MAGNESIUM: Magnesium: 1.6 mg/dL — ABNORMAL LOW (ref 1.7–2.4)

## 2020-03-30 MED ORDER — POTASSIUM CHLORIDE 20 MEQ PO PACK
40.0000 meq | PACK | Freq: Two times a day (BID) | ORAL | Status: AC
  Administered 2020-03-30 (×2): 40 meq via ORAL
  Filled 2020-03-30 (×2): qty 2

## 2020-03-30 MED ORDER — MAGNESIUM SULFATE 2 GM/50ML IV SOLN
2.0000 g | Freq: Once | INTRAVENOUS | Status: AC
  Administered 2020-03-30: 2 g via INTRAVENOUS
  Filled 2020-03-30: qty 50

## 2020-03-30 MED ORDER — IRBESARTAN 150 MG PO TABS
300.0000 mg | ORAL_TABLET | Freq: Every day | ORAL | Status: DC
  Administered 2020-03-30 – 2020-04-03 (×5): 300 mg via ORAL
  Filled 2020-03-30 (×5): qty 2

## 2020-03-30 NOTE — Evaluation (Signed)
Occupational Therapy Evaluation Patient Details Name: Justin Griffith MRN: [id number] DOB: 22-Mar-1941 Today's Date: 03/30/2020    History of Present Illness Justin Griffith is a 80 y.o. male with medical history significant of osteoarthritis, asthma, chronic back pain, colon polyp, COPD, emphysema of the lung, heart murmur, hernia, hyperlipidemia, hypertension, colon cancer, tobacco use, sleep apnea on CPAP, type 2 diabetes with proteinuria who is coming to the emergency department due to having a syncopal episode at home while in the bathroom hitting his forehead, sustaining a hematoma in the area and one episode of emesis.   Clinical Impression   Justin Griffith was seen for OT evaluation this date. Prior to hospital admission, pt was MOD I using RW for mobility and I/ADLs. Pt lives c wife who he is caregiver for (Alzheimers, requires IADL assist) in home c 1 STE. Pt presents to acute OT demonstrating impaired ADL performance and functional mobility 2/2 decreased activty tolerance and functional strength/balance deficits.   Pt currently requires CGA for LBD seated EOB. MIN A + RW sit<>stand - assist to stabilize RW as pt uses heavy momentum to stand and has poor eccentric control. Toilet t/f attempt terminated after ~2 steps as pt reported dizziness and LOB in standing - pt aware and redirects back to sitting. Pt would benefit from skilled OT to address noted impairments and functional limitations (see below for any additional details) in order to maximize safety and independence while minimizing falls risk and caregiver burden. Upon hospital discharge, recommend STR to maximize pt safety and return to PLOF.  ORTHOSTATICS SEATED: BP 118/71, MAP 86, HR 68 STANDING: BP 142/104, MAP 117, HR 66 - endorses headache and mild dizziness SEATED: BP 141/79, MAP 95, HR 72    Follow Up Recommendations  SNF (may progress)   Equipment Recommendations  3 in 1 bedside commode    Recommendations for Other Services        Precautions / Restrictions Precautions Precautions: Fall Restrictions Weight Bearing Restrictions: No      Mobility Bed Mobility Overal bed mobility: Modified Independent             General bed mobility comments: Increased time + rails sit>sup    Transfers Overall transfer level: Needs assistance Equipment used: Rolling walker (2 wheeled) Transfers: Stand Pivot Transfers;Lateral/Scoot Transfers   Stand pivot transfers: Min assist      Lateral/Scoot Transfers: Min guard General transfer comment: assist to stabilize RW as pt uses heavy momentum to stand and has poor eccentric contro    Balance Overall balance assessment: Needs assistance Sitting-balance support: Single extremity supported;Feet supported Sitting balance-Leahy Scale: Fair     Standing balance support: Single extremity supported Standing balance-Leahy Scale: Fair                             ADL either performed or assessed with clinical judgement   ADL Overall ADL's : Needs assistance/impaired                                       General ADL Comments: CGA for LBD seated EOB. MIN A for ADL t/f (to stabilize RW as pt uses heavy momentum to stand and has poor eccentric control). Toilet t/f attempt terminated following dizziness and LOB in standing - pt aware and redirects back to sitting  Pertinent Vitals/Pain Pain Assessment: Faces Faces Pain Scale: Hurts a little bit Pain Location: headache Pain Descriptors / Indicators: Headache Pain Intervention(s): Limited activity within patient's tolerance     Hand Dominance Right   Extremity/Trunk Assessment Upper Extremity Assessment Upper Extremity Assessment: Overall WFL for tasks assessed   Lower Extremity Assessment Lower Extremity Assessment: Generalized weakness       Communication Communication Communication: HOH   Cognition Arousal/Alertness: Awake/alert Behavior During Therapy:  WFL for tasks assessed/performed Overall Cognitive Status: Within Functional Limits for tasks assessed                                     General Comments  SEATED: BP 118/71, MAP 86. STANDING: BP 142/104, MAP 117. SEATED: BP 141/79, MAP 95    Exercises Exercises: Other exercises Other Exercises Other Exercises: Pt educated re: OT role, DME recs, d/c recs, falls prevention, ECS, HEP Other Exercises: LBD, sit>sup, sit<>stand, sitting/standing balance/tolerance   Shoulder Instructions      Home Living Family/patient expects to be discharged to:: Private residence Living Arrangements: Spouse/significant other Available Help at Discharge: Family;Available PRN/intermittently Type of Home: House Home Access: Stairs to enter [employer] of Steps: 1   Home Layout: One level     Bathroom Shower/Tub: Walk-in shower         Home Equipment: Environmental consultant - 2 wheels   Additional Comments: wife has alzheimers (no physical assist)      Prior Functioning/Environment Level of Independence: Independent with assistive device(s)        Comments: MOD I using RW as needed. Drives and completes IADLs. Will call spouses children for PRN assist c deep cleaning. Endorses 4 falls in last 6 months        OT Problem List: Decreased strength;Decreased activity tolerance;Impaired balance (sitting and/or standing)      OT Treatment/Interventions: Therapeutic exercise;Self-care/ADL training;Energy conservation;DME and/or AE instruction;Therapeutic activities;Patient/family education;Balance training    OT Goals(Current goals can be found in the care plan section) Acute Rehab OT Goals Patient Stated Goal: To return home safely OT Goal Formulation: With patient Time For Goal Achievement: 04/13/20 Potential to Achieve Goals: Good ADL Goals Pt Will Perform Grooming: with modified independence;standing (c LRAD PRN) Pt Will Transfer to Toilet: with modified  independence;ambulating;regular height toilet (c LRAD PRN) Additional ADL Goal #1: Pt will Independently verbalize plan to implement x3 falls prevention strategies  OT Frequency: Min 2X/week   Barriers to D/C: Decreased caregiver support             AM-PAC OT "6 Clicks" Daily Activity     Outcome Measure Help from another person eating meals?: None Help from another person taking care of personal grooming?: A Little Help from another person toileting, which includes using toliet, bedpan, or urinal?: A Lot Help from another person bathing (including washing, rinsing, drying)?: A Little Help from another person to put on and taking off regular upper body clothing?: A Little Help from another person to put on and taking off regular lower body clothing?: A Little 6 Click Score: 18   End of Session Equipment Utilized During Treatment: Rolling walker  Activity Tolerance: Patient tolerated treatment well Patient left: in bed;with call bell/phone within reach  OT Visit Diagnosis: Other abnormalities of gait and mobility (R26.89);History of falling (Z91.81)                Time: 6160-7371 OT Time Calculation (min): 24  min Charges:  OT General Charges $OT Visit: 1 Visit OT Evaluation $OT Eval Moderate Complexity: 1 Mod OT Treatments $Therapeutic Activity: 8-22 mins  Dessie Coma, M.S. OTR/L  03/30/20, 3:03 PM  ascom [phone]

## 2020-03-30 NOTE — Progress Notes (Signed)
[facility]  SUBJECTIVE: Patient denies chest pain or shortness of breath   Vitals:   03/30/20 0627 03/30/20 0741 03/30/20 0800 03/30/20 0909  BP: (!) 152/74 (!) 143/80 (!) 160/63   Pulse: 76 73 74   Resp: 20  19   Temp:      TempSrc:      SpO2: 99%  100% 93%  Weight:      Height:         Intake/Output Summary (Last 24 hours) at 03/30/2020 1008 Last data filed at 03/30/2020 0400 Gross per 24 hour  Intake 100 ml  Output 700 ml  Net -600 ml      PHYSICAL EXAM  General: Well developed, well nourished, in no acute distress HEENT:  Normocephalic and atramatic Neck:  No JVD.  Lungs: Clear bilaterally to auscultation and percussion. Heart: HRRR . Normal S1 and S2 without gallops or murmurs.  Abdomen: Bowel sounds are positive, abdomen soft and non-tender  Msk:  Back normal, normal gait. Normal strength and tone for age. Extremities: No clubbing, cyanosis or edema.   Neuro: Alert and oriented X 3. Psych:  Good affect, responds appropriately   LABS: Basic Metabolic Panel: Recent Labs    03/28/20 1535 03/29/20 0346 03/30/20 0316  NA 130* 134* 134*  K 3.4* 3.6 3.3*  CL 93* 96* 96*  CO2 25 27 27   GLUCOSE 214* 144* 135*  BUN 16 21 28*  CREATININE 1.08 1.85* 1.35*  CALCIUM 9.5 9.4 9.1  MG 1.2*  --  1.6*  PHOS 2.3*  --  4.1   Liver Function Tests: Recent Labs    03/30/20 0316  AST 33  ALT 20  ALKPHOS 45  BILITOT 1.0  PROT 6.6  ALBUMIN 2.7*   No results for input(s): LIPASE, AMYLASE in the last 72 hours. CBC: Recent Labs    03/29/20 1413 03/30/20 0316  WBC 13.9* 10.6*  HGB 12.5* 12.6*  HCT 36.4* 36.8*  MCV 90.3 90.4  PLT 214 229   Cardiac Enzymes: No results for input(s): CKTOTAL, CKMB, CKMBINDEX, TROPONINI in the last 72 hours. BNP: Invalid input(s): POCBNP D-Dimer: No results for input(s): DDIMER in the last 72 hours. Hemoglobin A1C: Recent Labs    03/29/20 0346  HGBA1C 6.9*   Fasting Lipid Panel: No results for input(s): CHOL, HDL,  LDLCALC, TRIG, CHOLHDL, LDLDIRECT in the last 72 hours. Thyroid Function Tests: No results for input(s): TSH, T4TOTAL, T3FREE, THYROIDAB in the last 72 hours.  Invalid input(s): FREET3 Anemia Panel: No results for input(s): VITAMINB12, FOLATE, FERRITIN, TIBC, IRON, RETICCTPCT in the last 72 hours.  CT HEAD WO CONTRAST  Result Date: 03/28/2020 CLINICAL DATA:  Head trauma, moderate/severe. Additional history provided: Syncopal episode with fall and head trauma. EXAM: CT HEAD WITHOUT CONTRAST TECHNIQUE: Contiguous axial images were obtained from the base of the skull through the vertex without intravenous contrast. COMPARISON:  Head CT 01/23/2020.  Brain MRI 12/14/2019. FINDINGS: Brain: Mild cerebral atrophy. Mild ill-defined hypoattenuation within the cerebral white matter is nonspecific, but compatible with chronic small vessel ischemic disease. There is no acute intracranial hemorrhage. No demarcated cortical infarct. No extra-axial fluid collection. No evidence of intracranial mass. No midline shift. Vascular: No hyperdense vessel.  Atherosclerotic calcifications. Skull: Normal. Negative for fracture or focal lesion. Sinuses/Orbits: Visualized orbits show no acute finding. Mild ethmoid sinus mucosal thickening. Bilateral mastoid effusions. Other: Right forehead hematoma. IMPRESSION: No evidence of acute intracranial abnormality. Right forehead hematoma. Mild cerebral atrophy and chronic small vessel ischemic disease. Mild  ethmoid sinus mucosal thickening. Bilateral mastoid effusions. Electronically Signed   By: Kellie Simmering DO   On: 03/28/2020 16:21   US Carotid Bilateral  Result Date: 03/29/2020 CLINICAL DATA:  Syncope. Hypertension, hyperlipidemia, diabetes, history of tobacco abuse EXAM: BILATERAL CAROTID DUPLEX ULTRASOUND TECHNIQUE: Pearline Cables scale imaging, color Doppler and duplex ultrasound were performed of bilateral carotid and vertebral arteries in the neck. COMPARISON:  12/15/2019 FINDINGS:  Criteria: Quantification of carotid stenosis is based on velocity parameters that correlate the residual internal carotid diameter with NASCET-based stenosis levels, using the diameter of the distal internal carotid lumen as the denominator for stenosis measurement. The following velocity measurements were obtained: RIGHT ICA: 101/26 cm/sec CCA: [id number] cm/sec SYSTOLIC ICA/CCA RATIO:  1.0 ECA: 239 cm/sec LEFT ICA: 121/32 cm/sec CCA: [id number] cm/sec SYSTOLIC ICA/CCA RATIO:  1.0 ECA: 136 cm/sec RIGHT CAROTID ARTERY: Eccentric partially calcified plaque in the proximal internal and external carotid arteries. Mildly elevated peak systolic velocities in the ECA. Some distal acoustic shadowing in the proximal ICA. Elsewhere normal waveforms and color Doppler signal. No high-grade stenosis. RIGHT VERTEBRAL ARTERY:  Normal flow direction and waveform. LEFT CAROTID ARTERY: Calcified plaque in the bulb and proximal ICA without high-grade stenosis. Normal waveforms and color Doppler signal. LEFT VERTEBRAL ARTERY:  Normal flow direction and waveform. IMPRESSION: 1. Bilateral carotid bifurcation plaque resulting in less than 50% diameter ICA stenosis. 2. Antegrade bilateral vertebral arterial flow. Electronically Signed   By: Lucrezia Europe M.D.   On: 03/29/2020 11:06   DG Chest Portable 1 View  Result Date: 03/28/2020 CLINICAL DATA:  Acute shortness of breath. EXAM: PORTABLE CHEST 1 VIEW COMPARISON:  01/23/2020 FINDINGS: Cardiomediastinal silhouette is unchanged. Pulmonary vascular congestion is present. Peribronchial thickening and LEFT basilar atelectasis/scarring again noted. There is no evidence of focal airspace disease, pulmonary edema, suspicious pulmonary nodule/mass, pleural effusion, or pneumothorax. No acute bony abnormalities are identified. IMPRESSION: Pulmonary vascular congestion. Electronically Signed   By: Margarette Canada M.D.   On: 03/28/2020 18:20   ECHOCARDIOGRAM COMPLETE  Result Date: 03/29/2020    ECHOCARDIOGRAM  REPORT   Patient Name:   Justin Griffith [facility] Date of Exam: 03/29/2020 Medical Rec #:  [id number]      Height:       70.0 in Accession #:    [id number]     Weight:       276.0 lb Date of Birth:  [birth year]      BSA:          2.394 m Patient Age:    31 years       BP:           123/69 mmHg Patient Gender: M              HR:           60 bpm. Exam Location:  [facility] Procedure: 2D Echo, Color Doppler, Cardiac Doppler and Intracardiac            Opacification Agent Indications:     I21.4 NSTEMI  History:         Patient has prior history of Echocardiogram examinations, most                  recent 01/24/2020. COPD; Risk Factors:Diabetes, Sleep Apnea,                  Hypertension and Dyslipidemia.  Sonographer:     Charmayne Sheer RDCS (AE) Referring Phys:  [id number] [place] Diagnosing Phys: Isaias Cowman MD  Sonographer Comments: Suboptimal apical window. Image acquisition challenging due to patient body habitus and Image acquisition challenging due to COPD. IMPRESSIONS  1. Left ventricular ejection fraction, by estimation, is 60 to 65%. The left ventricle has normal function. The left ventricle has no regional wall motion abnormalities. Left ventricular diastolic parameters are consistent with Grade I diastolic dysfunction (impaired relaxation).  2. Right ventricular systolic function is normal. The right ventricular size is normal.  3. The mitral valve is normal in structure. Mild mitral valve regurgitation. No evidence of mitral stenosis.  4. The aortic valve is normal in structure. Aortic valve regurgitation is not visualized. Mild aortic valve stenosis.  5. The inferior vena cava is normal in size with greater than 50% respiratory variability, suggesting right atrial pressure of 3 mmHg. FINDINGS  Left Ventricle: Left ventricular ejection fraction, by estimation, is 60 to 65%. The left ventricle has normal function. The left ventricle has no regional wall motion abnormalities. Definity contrast agent was given IV to  delineate the left ventricular  endocardial borders. The left ventricular internal cavity size was normal in size. There is no left ventricular hypertrophy. Left ventricular diastolic parameters are consistent with Grade I diastolic dysfunction (impaired relaxation). Right Ventricle: The right ventricular size is normal. No increase in right ventricular wall thickness. Right ventricular systolic function is normal. Left Atrium: Left atrial size was normal in size. Right Atrium: Right atrial size was normal in size. Pericardium: There is no evidence of pericardial effusion. Mitral Valve: The mitral valve is normal in structure. Mild mitral valve regurgitation. No evidence of mitral valve stenosis. MV peak gradient, 4.2 mmHg. The mean mitral valve gradient is 2.0 mmHg. Tricuspid Valve: The tricuspid valve is normal in structure. Tricuspid valve regurgitation is mild . No evidence of tricuspid stenosis. Aortic Valve: The aortic valve is normal in structure. Aortic valve regurgitation is not visualized. Mild aortic stenosis is present. Aortic valve mean gradient measures 12.0 mmHg. Aortic valve peak gradient measures 23.9 mmHg. Aortic valve area, by VTI measures 1.87 cm. Pulmonic Valve: The pulmonic valve was normal in structure. Pulmonic valve regurgitation is not visualized. No evidence of pulmonic stenosis. Aorta: The aortic root is normal in size and structure. Venous: The inferior vena cava is normal in size with greater than 50% respiratory variability, suggesting right atrial pressure of 3 mmHg. IAS/Shunts: No atrial level shunt detected by color flow Doppler.  LEFT VENTRICLE PLAX 2D LVIDd:         4.30 cm  Diastology LVIDs:         2.80 cm  LV e' medial:    6.20 cm/s LV PW:         1.40 cm  LV E/e' medial:  12.4 LV IVS:        1.20 cm  LV e' lateral:   4.68 cm/s LVOT diam:     2.20 cm  LV E/e' lateral: 16.5 LV SV:         92 LV SV Index:   38 LVOT Area:     3.80 cm  RIGHT VENTRICLE RV Basal diam:  4.50 cm LEFT  ATRIUM             Index       RIGHT ATRIUM           Index LA diam:        3.80 cm 1.59 cm/m  RA Area:     21.50 cm LA Vol (A2C):   58.5 ml 24.44 ml/m RA  Volume:   55.50 ml  23.19 ml/m LA Vol (A4C):   78.2 ml 32.67 ml/m LA Biplane Vol: 69.5 ml 29.04 ml/m  AORTIC VALVE                    PULMONIC VALVE AV Area (Vmax):    1.85 cm     PV Vmax:       1.51 m/s AV Area (Vmean):   1.90 cm     PV Vmean:      109.000 cm/s AV Area (VTI):     1.87 cm     PV VTI:        0.347 m AV Vmax:           244.50 cm/s  PV Peak grad:  9.1 mmHg AV Vmean:          157.500 cm/s PV Mean grad:  5.0 mmHg AV VTI:            0.491 m AV Peak Grad:      23.9 mmHg AV Mean Grad:      12.0 mmHg LVOT Vmax:         119.00 cm/s LVOT Vmean:        78.800 cm/s LVOT VTI:          0.241 m LVOT/AV VTI ratio: 0.49  AORTA Ao Root diam: 3.50 cm MITRAL VALVE MV Area (PHT): 2.94 cm    SHUNTS MV Peak grad:  4.2 mmHg    Systemic VTI:  0.24 m MV Mean grad:  2.0 mmHg    Systemic Diam: 2.20 cm MV Vmax:       1.03 m/s MV Vmean:      72.4 cm/s MV Decel Time: 258 msec MV E velocity: 77.10 cm/s MV A velocity: 87.30 cm/s MV E/A ratio:  0.88 Isaias Cowman MD Electronically signed by Isaias Cowman MD Signature Date/Time: 03/29/2020/4:03:13 PM    Final    CT RENAL STONE STUDY  Result Date: 03/29/2020 CLINICAL DATA:  Hematuria with unknown cause EXAM: CT ABDOMEN AND PELVIS WITHOUT CONTRAST TECHNIQUE: Multidetector CT imaging of the abdomen and pelvis was performed following the standard protocol without IV contrast. COMPARISON:  01/23/2020 FINDINGS: Lower chest: 7 mm right lower lobe pulmonary nodule, benign based on previous imaging. Hepatobiliary: No focal liver abnormality.Absent gallbladder. No bile duct dilatation. Pancreas: Unremarkable. Spleen: Unremarkable. Adrenals/Urinary Tract: Negative adrenals. No hydronephrosis or stone. Chronic perinephric stranding, nonspecific. Thick walled appearance of the bladder without over distension, likely  chronic outlet obstruction given the prostate. Stomach/Bowel: No obstruction. No appendicitis. Postoperative colon with rectosigmoid anastomosis. Vascular/Lymphatic: No acute vascular abnormality. Diffuse atheromatous calcification. No mass or adenopathy. Reproductive:Enlarged prostate up lifting the bladder base. Other: No ascites or pneumoperitoneum. Musculoskeletal: No acute abnormalities. Advanced, generalized spinal degeneration. IMPRESSION: 1. No acute finding.  No hydronephrosis or urolithiasis. 2. Enlarged prostate with thick walled bladder suggesting chronic outlet obstruction. No bladder dilatation or visible clot. Electronically Signed   By: Monte Fantasia M.D.   On: 03/29/2020 08:49     Echo pending  TELEMETRY: Sinus rhythm:  ASSESSMENT AND PLAN:  Principal Problem:   NSTEMI (non-ST elevated myocardial infarction) ([place]) Active Problems:   Sleep apnea   BPH (benign prostatic hyperplasia)   COPD (chronic obstructive pulmonary disease) (HCC)   Syncope   Memory changes   Coronary artery disease   Hypokalemia   Type 2 diabetes mellitus with hyperglycemia (HCC)   Leukocytosis   Class 2 obesity   Hypomagnesemia   Hypophosphatemia  Hypertension   Hyponatremia   Urinary tract infection    1. Non-ST elevation myocardial infarction, with elevated high-sensitivity troponin ([place], 2723, 4791), with abnormal ECG, in the absence of chest pain.  Heparin discontinued due to gross hematuria.  Continues to deny chest pain or shortness of breath. 2.  Mild aortic stenosis of uncertain clinical significance 3.  Gross hematuria, exacerbated by heparin drip, with probable UTI/urosepsis.  Patient seen by urology, who recommends holding anticoagulation, with outpatient cystoscopy scheduled. 4.  Syncope, of uncertain etiology, currently in sinus rhythm 5.  COPD O2 by nasal cannula  Recommendations  1.  Agree holding heparin drip for now in setting of gross hematuria 2.  Defer urgent  cardiac catheterization in the absence of chest pain 3.  Continue aspirin and carvedilol 4.  Continue rosuvastatin 5.  Resume olmesartan as needed 6.  Hold clonidine for now 7.  Review 2D echocardiogram 8.  Further recommendations pending echocardiogram results  Isaias Cowman, MD, PhD, [facility] 03/30/2020 10:08 AM

## 2020-03-30 NOTE — ED Notes (Addendum)
Called pharmacy regarding missing medication doses at this time. Per Mayra in pharmacy, will send up.

## 2020-03-30 NOTE — ED Notes (Signed)
Pt family member at bed side at this time.

## 2020-03-30 NOTE — ED Notes (Signed)
Pt assisted back to recliner from toilet at this time. This RN noted that pt did not have BM at this time. Pt tolerated ambulation back to recliner well. Pt hooked back up to cardiac monitor, pulse ox, and BP cuff at this time. Pt reminded to make needs known using call bell.

## 2020-03-30 NOTE — ED Notes (Addendum)
Pt assisted to toilet at this time. Pt tolerated well. Pt reminded to pull red cord when finished or for assistance, pt verbalized understanding at this time

## 2020-03-30 NOTE — ED Notes (Signed)
This RN checked on pt at this time. Pt remains on toilet, NAD noted. Pt stated he wanted to sit on toilet a little longer and will pull red cord when done.

## 2020-03-30 NOTE — ED Notes (Signed)
Pt ambulated x1 assisted from recliner to toilet. Pt rang call bell to return to chair and RN assisted pt back to chair. Pt noted to not use restroom during this time.  Once pt sat in chair, pt stated to RN "I feel like I am going to faint and I don't know why." RN connected pt back to cardiac, O2, and BP monitoring. Pt then had moment of unresponsiveness and aroused by sternal rub. Pt then positioned in reclining position in stated he "felt better." Pt appears in no apparent distress at this time.

## 2020-03-30 NOTE — Progress Notes (Signed)
PROGRESS NOTE    Justin Griffith  MWN:[id number] DOB: Nov 05, 1940 DOA: 03/28/2020 PCP: Guadalupe Maple, MD   Brief Narrative:  Patient is 80 year old male with past medical history significant of osteoarthritis, asthma, chronic back pain, colon polyp, COPD, emphysema of the lung, heart murmur, hernia, hyperlipidemia, hypertension, colon cancer, tobacco abuse, sleep apnea-on CPAP, type 2 diabetes mellitus with proteinuria presents to emergency department with syncopal episode at home while in the bathroom hitting his forehead, sustaining a hematoma in the area and one episode of emesis.  ED course: Afebrile, vital signs within normal limits, maintaining oxygen saturation 92% on room air, EKG shows ST-T wave changes on anterior lateral leads.  Troponin was 1890 and then 2723, BNP: 256, UA shows pyuria and bacteriuria.  CBC showed a white count of 17,000.  Hemoglobin 13.7, platelet: 198, PT/INR: WNL sodium 130, potassium 3.4, renal function within normal limit.  Chest x-ray shows pulmonary vascular congestion, peribronchial thickening and left basilar atelectasis/scarring again noted.  CT head did not show any acute findings.  Cardiology consulted and patient admitted for further evaluation and management.  Assessment & Plan:   NSTEMI: -Patient presented with syncopal episode with elevated troponin.  Reviewed EKG. -Patient started on heparin gtt in ED which was discontinued later due to frank hematuria. -Cardiology consulted-appreciate input recommended against for urgent cardiac catheterization in the absence of chest pain and in the presence of gross hematuria on heparin drip. -Continue aspirin, statin, Coreg. -Echo shows ejection fraction of 60 to 65% with grade 1 diastolic dysfunction. -Patient remained chest pain-free.  Continue to monitor.    Syncope: -CT head negative for acute findings.  Echo as above.  Carotid Doppler ultrasound showed bilateral carotid bifurcation plaque resulting in  less than 50% diameter ICA stenosis. -TSH, free T4: WNL from 12/7 -On fall precautions. -Consult PT/OT.  UTI/gross materia: -UA concerning for infection.  Patient started on Rocephin in ED.  Urine culture is pending.  Leukocytosis is improving.  He remained afebrile. -CT renal study: Negative for nephrolithiasis or hydronephrosis.  DC heparin GTT. -Urology consulted recommended to continue to hold anticoagulation, continue antibiotics for 10 days and follow urine culture result. -Outpatient cystoscopy appointment with Dr. Erlene Quan on 04/03/2020.  Appreciate urology's recommendations.  AKI: Likely in the setting of dehydration and infection.  Renal function improving.   Continue to hold nephrotoxic medication and monitor kidney function closely.  Hypertension: Blood pressure elevated this morning. -Continue Coreg.  Continue to hold alpha-blocker due to syncopal/dizziness -resume losartan.  Continue to hold clonidine as per cardiology's recommendations. -Continue to monitor blood pressure closely  Hyponatremia: Sodium level improved from 130-134.  Continue to monitor  Hypokalemia: Replenished.  Hypomagnesemia: Replenished.  Repeat magnesium level tomorrow AM.  BPH: Flomax is on hold due to syncope/dizziness  COPD: No wheezing noted on exam.  Continue supplemental oxygen as needed.  Continue bronchodilators as needed  Memory changes: Continue donepezil  Morbid obesity with BMI of 39: -Diet modification/exercise and weight loss recommended.  Type 2 diabetes mellitus: Well controlled.  A1c 6.9%. -We will hold Metformin.  Continue sliding scale insulin and Lantus 12 units daily.  Hypophosphatemia: Replenished.  Resolved.  DVT prophylaxis: SCD, no chemical anticoagulation due to frank hematuria Code Status: Full code Family Communication:  None present at bedside.  Plan of care discussed with patient in length and he verbalized understanding and agreed with it.  I called patient's  daughter and discussed plan of care and she verbalized understanding.  Disposition Plan: To be  determined  Consultants:   Cardiology  urology  Procedures:  CT renal study Echo Carotid doppler  Antimicrobials:   Rocephin  Dispo: The patient is from: Home              Anticipated d/c is to: SNF              Anticipated d/c date is: 2 days              Patient currently is not medically stable to d/c.   Subjective: Patient seen and examined in the ED.  Complaining of dizziness, pain, swelling in his legs, feeling weak and sick.  Objective: Vitals:   03/30/20 0741 03/30/20 0800 03/30/20 0909 03/30/20 1144  BP: (!) 143/80 (!) 160/63    Pulse: 73 74    Resp:  19    Temp:    97.6 F (36.4 C)  TempSrc:    Oral  SpO2:  100% 93%   Weight:      Height:        Intake/Output Summary (Last 24 hours) at 03/30/2020 1243 Last data filed at 03/30/2020 0400 Gross per 24 hour  Intake 100 ml  Output 700 ml  Net -600 ml   Filed Weights   03/28/20 1526  Weight: 125.2 kg    Examination:  General exam: Appears calm and comfortable, sitting comfortably on the recliner.  On room air, obese, appears weak and sick.   Respiratory system: Clear to auscultation. Respiratory effort normal. Cardiovascular system: S1 & S2 heard, RRR. No JVD, murmurs, rubs, gallops or clicks. No pedal edema. Gastrointestinal system: Abdomen is nondistended, soft and nontender. No organomegaly or masses felt. Normal bowel sounds heard. Central nervous system: Alert and oriented. No focal neurological deficits. Extremities: Symmetric 5 x 5 power. Skin: No rashes, lesions or ulcers Psychiatry: Judgement and insight appear normal. Mood & affect appropriate.    Data Reviewed: I have personally reviewed following labs and imaging studies  CBC: Recent Labs  Lab 03/28/20 1535 03/29/20 0346 03/29/20 1413 03/30/20 0316  WBC 17.0* 15.1* 13.9* 10.6*  HGB 13.7 13.6 12.5* 12.6*  HCT 39.8 39.0 36.4* 36.8*  MCV  90.0 90.3 90.3 90.4  PLT 198 202 214 [id number]   Basic Metabolic Panel: Recent Labs  Lab 03/28/20 1535 03/29/20 0346 03/30/20 0316  NA 130* 134* 134*  K 3.4* 3.6 3.3*  CL 93* 96* 96*  CO2 25 27 27   GLUCOSE 214* 144* 135*  BUN 16 21 28*  CREATININE 1.08 1.85* 1.35*  CALCIUM 9.5 9.4 9.1  MG 1.2*  --  1.6*  PHOS 2.3*  --  4.1   GFR: Estimated Creatinine Clearance: 58 mL/min (A) (by C-G formula based on SCr of 1.35 mg/dL (H)). Liver Function Tests: Recent Labs  Lab 03/30/20 0316  AST 33  ALT 20  ALKPHOS 45  BILITOT 1.0  PROT 6.6  ALBUMIN 2.7*   No results for input(s): LIPASE, AMYLASE in the last 168 hours. No results for input(s): AMMONIA in the last 168 hours. Coagulation Profile: Recent Labs  Lab 03/28/20 1818  INR 1.2   Cardiac Enzymes: No results for input(s): CKTOTAL, CKMB, CKMBINDEX, TROPONINI in the last 168 hours. BNP (last 3 results) No results for input(s): PROBNP in the last 8760 hours. HbA1C: Recent Labs    03/29/20 0346  HGBA1C 6.9*   CBG: Recent Labs  Lab 03/29/20 0850 03/29/20 1138 03/29/20 1912 03/29/20 2300 03/30/20 0745  GLUCAP 193* 181* 124* 156* 160*   Lipid Profile:  No results for input(s): CHOL, HDL, LDLCALC, TRIG, CHOLHDL, LDLDIRECT in the last 72 hours. Thyroid Function Tests: No results for input(s): TSH, T4TOTAL, FREET4, T3FREE, THYROIDAB in the last 72 hours. Anemia Panel: No results for input(s): VITAMINB12, FOLATE, FERRITIN, TIBC, IRON, RETICCTPCT in the last 72 hours. Sepsis Labs: Recent Labs  Lab 03/28/20 1818  LATICACIDVEN 1.8    Recent Results (from the past 240 hour(s))  Resp Panel by RT-PCR (Flu A&B, Covid) Nasopharyngeal Swab     Status: None   Collection Time: 03/28/20  6:18 PM   Specimen: Nasopharyngeal Swab; Nasopharyngeal(NP) swabs in vial transport medium  Result Value Ref Range Status   SARS Coronavirus 2 by RT PCR NEGATIVE NEGATIVE Final    Comment: (NOTE) SARS-CoV-2 target nucleic acids are NOT  DETECTED.  The SARS-CoV-2 RNA is generally detectable in upper respiratory specimens during the acute phase of infection. The lowest concentration of SARS-CoV-2 viral copies this assay can detect is 138 copies/mL. A negative result does not preclude SARS-Cov-2 infection and should not be used as the sole basis for treatment or other patient management decisions. A negative result may occur with  improper specimen collection/handling, submission of specimen other than nasopharyngeal swab, presence of viral mutation(s) within the areas targeted by this assay, and inadequate number of viral copies(<138 copies/mL). A negative result must be combined with clinical observations, patient history, and epidemiological information. The expected result is Negative.  Fact Sheet for Patients:  EntrepreneurPulse.com.au  Fact Sheet for Healthcare Providers:  IncredibleEmployment.be  This test is no t yet approved or cleared by the [place] FDA and  has been authorized for detection and/or diagnosis of SARS-CoV-2 by FDA under an Emergency Use Authorization (EUA). This EUA will remain  in effect (meaning this test can be used) for the duration of the COVID-19 declaration under Section 564(b)(1) of the Act, 21 U.S.C.section 360bbb-3(b)(1), unless the authorization is terminated  or revoked sooner.       Influenza A by PCR NEGATIVE NEGATIVE Final   Influenza B by PCR NEGATIVE NEGATIVE Final    Comment: (NOTE) The Xpert Xpress SARS-CoV-2/FLU/RSV plus assay is intended as an aid in the diagnosis of influenza from Nasopharyngeal swab specimens and should not be used as a sole basis for treatment. Nasal washings and aspirates are unacceptable for Xpert Xpress SARS-CoV-2/FLU/RSV testing.  Fact Sheet for Patients: EntrepreneurPulse.com.au  Fact Sheet for Healthcare Providers: IncredibleEmployment.be  This test is not yet  approved or cleared by the [place] FDA and has been authorized for detection and/or diagnosis of SARS-CoV-2 by FDA under an Emergency Use Authorization (EUA). This EUA will remain in effect (meaning this test can be used) for the duration of the COVID-19 declaration under Section 564(b)(1) of the Act, 21 U.S.C. section 360bbb-3(b)(1), unless the authorization is terminated or revoked.  Performed at [facility], [address] [place], [place] [address]       Radiology Studies: CT HEAD WO CONTRAST  Result Date: 03/28/2020 CLINICAL DATA:  Head trauma, moderate/severe. Additional history provided: Syncopal episode with fall and head trauma. EXAM: CT HEAD WITHOUT CONTRAST TECHNIQUE: Contiguous axial images were obtained from the base of the skull through the vertex without intravenous contrast. COMPARISON:  Head CT 01/23/2020.  Brain MRI 12/14/2019. FINDINGS: Brain: Mild cerebral atrophy. Mild ill-defined hypoattenuation within the cerebral white matter is nonspecific, but compatible with chronic small vessel ischemic disease. There is no acute intracranial hemorrhage. No demarcated cortical infarct. No extra-axial fluid collection. No evidence of intracranial  mass. No midline shift. Vascular: No hyperdense vessel.  Atherosclerotic calcifications. Skull: Normal. Negative for fracture or focal lesion. Sinuses/Orbits: Visualized orbits show no acute finding. Mild ethmoid sinus mucosal thickening. Bilateral mastoid effusions. Other: Right forehead hematoma. IMPRESSION: No evidence of acute intracranial abnormality. Right forehead hematoma. Mild cerebral atrophy and chronic small vessel ischemic disease. Mild ethmoid sinus mucosal thickening. Bilateral mastoid effusions. Electronically Signed   By: Kellie Simmering DO   On: 03/28/2020 16:21   US Carotid Bilateral  Result Date: 03/29/2020 CLINICAL DATA:  Syncope. Hypertension, hyperlipidemia, diabetes, history of tobacco abuse EXAM: BILATERAL  CAROTID DUPLEX ULTRASOUND TECHNIQUE: Pearline Cables scale imaging, color Doppler and duplex ultrasound were performed of bilateral carotid and vertebral arteries in the neck. COMPARISON:  12/15/2019 FINDINGS: Criteria: Quantification of carotid stenosis is based on velocity parameters that correlate the residual internal carotid diameter with NASCET-based stenosis levels, using the diameter of the distal internal carotid lumen as the denominator for stenosis measurement. The following velocity measurements were obtained: RIGHT ICA: 101/26 cm/sec CCA: [id number] cm/sec SYSTOLIC ICA/CCA RATIO:  1.0 ECA: 239 cm/sec LEFT ICA: 121/32 cm/sec CCA: [id number] cm/sec SYSTOLIC ICA/CCA RATIO:  1.0 ECA: 136 cm/sec RIGHT CAROTID ARTERY: Eccentric partially calcified plaque in the proximal internal and external carotid arteries. Mildly elevated peak systolic velocities in the ECA. Some distal acoustic shadowing in the proximal ICA. Elsewhere normal waveforms and color Doppler signal. No high-grade stenosis. RIGHT VERTEBRAL ARTERY:  Normal flow direction and waveform. LEFT CAROTID ARTERY: Calcified plaque in the bulb and proximal ICA without high-grade stenosis. Normal waveforms and color Doppler signal. LEFT VERTEBRAL ARTERY:  Normal flow direction and waveform. IMPRESSION: 1. Bilateral carotid bifurcation plaque resulting in less than 50% diameter ICA stenosis. 2. Antegrade bilateral vertebral arterial flow. Electronically Signed   By: Lucrezia Europe M.D.   On: 03/29/2020 11:06   DG Chest Portable 1 View  Result Date: 03/28/2020 CLINICAL DATA:  Acute shortness of breath. EXAM: PORTABLE CHEST 1 VIEW COMPARISON:  01/23/2020 FINDINGS: Cardiomediastinal silhouette is unchanged. Pulmonary vascular congestion is present. Peribronchial thickening and LEFT basilar atelectasis/scarring again noted. There is no evidence of focal airspace disease, pulmonary edema, suspicious pulmonary nodule/mass, pleural effusion, or pneumothorax. No acute bony abnormalities  are identified. IMPRESSION: Pulmonary vascular congestion. Electronically Signed   By: Margarette Canada M.D.   On: 03/28/2020 18:20   ECHOCARDIOGRAM COMPLETE  Result Date: 03/29/2020    ECHOCARDIOGRAM REPORT   Patient Name:   GAELAN JASMINE [facility] Date of Exam: 03/29/2020 Medical Rec #:  [id number]      Height:       70.0 in Accession #:    [id number]     Weight:       276.0 lb Date of Birth:  [birth year]      BSA:          2.394 m Patient Age:    19 years       BP:           123/69 mmHg Patient Gender: M              HR:           60 bpm. Exam Location:  [facility] Procedure: 2D Echo, Color Doppler, Cardiac Doppler and Intracardiac            Opacification Agent Indications:     I21.4 NSTEMI  History:         Patient has prior history of Echocardiogram examinations, most  recent 01/24/2020. COPD; Risk Factors:Diabetes, Sleep Apnea,                  Hypertension and Dyslipidemia.  Sonographer:     Charmayne Sheer RDCS (AE) Referring Phys:  [id number] [place] Diagnosing Phys: Isaias Cowman MD  Sonographer Comments: Suboptimal apical window. Image acquisition challenging due to patient body habitus and Image acquisition challenging due to COPD. IMPRESSIONS  1. Left ventricular ejection fraction, by estimation, is 60 to 65%. The left ventricle has normal function. The left ventricle has no regional wall motion abnormalities. Left ventricular diastolic parameters are consistent with Grade I diastolic dysfunction (impaired relaxation).  2. Right ventricular systolic function is normal. The right ventricular size is normal.  3. The mitral valve is normal in structure. Mild mitral valve regurgitation. No evidence of mitral stenosis.  4. The aortic valve is normal in structure. Aortic valve regurgitation is not visualized. Mild aortic valve stenosis.  5. The inferior vena cava is normal in size with greater than 50% respiratory variability, suggesting right atrial pressure of 3 mmHg. FINDINGS  Left Ventricle: Left  ventricular ejection fraction, by estimation, is 60 to 65%. The left ventricle has normal function. The left ventricle has no regional wall motion abnormalities. Definity contrast agent was given IV to delineate the left ventricular  endocardial borders. The left ventricular internal cavity size was normal in size. There is no left ventricular hypertrophy. Left ventricular diastolic parameters are consistent with Grade I diastolic dysfunction (impaired relaxation). Right Ventricle: The right ventricular size is normal. No increase in right ventricular wall thickness. Right ventricular systolic function is normal. Left Atrium: Left atrial size was normal in size. Right Atrium: Right atrial size was normal in size. Pericardium: There is no evidence of pericardial effusion. Mitral Valve: The mitral valve is normal in structure. Mild mitral valve regurgitation. No evidence of mitral valve stenosis. MV peak gradient, 4.2 mmHg. The mean mitral valve gradient is 2.0 mmHg. Tricuspid Valve: The tricuspid valve is normal in structure. Tricuspid valve regurgitation is mild . No evidence of tricuspid stenosis. Aortic Valve: The aortic valve is normal in structure. Aortic valve regurgitation is not visualized. Mild aortic stenosis is present. Aortic valve mean gradient measures 12.0 mmHg. Aortic valve peak gradient measures 23.9 mmHg. Aortic valve area, by VTI measures 1.87 cm. Pulmonic Valve: The pulmonic valve was normal in structure. Pulmonic valve regurgitation is not visualized. No evidence of pulmonic stenosis. Aorta: The aortic root is normal in size and structure. Venous: The inferior vena cava is normal in size with greater than 50% respiratory variability, suggesting right atrial pressure of 3 mmHg. IAS/Shunts: No atrial level shunt detected by color flow Doppler.  LEFT VENTRICLE PLAX 2D LVIDd:         4.30 cm  Diastology LVIDs:         2.80 cm  LV e' medial:    6.20 cm/s LV PW:         1.40 cm  LV E/e' medial:  12.4  LV IVS:        1.20 cm  LV e' lateral:   4.68 cm/s LVOT diam:     2.20 cm  LV E/e' lateral: 16.5 LV SV:         92 LV SV Index:   38 LVOT Area:     3.80 cm  RIGHT VENTRICLE RV Basal diam:  4.50 cm LEFT ATRIUM             Index  RIGHT ATRIUM           Index LA diam:        3.80 cm 1.59 cm/m  RA Area:     21.50 cm LA Vol (A2C):   58.5 ml 24.44 ml/m RA Volume:   55.50 ml  23.19 ml/m LA Vol (A4C):   78.2 ml 32.67 ml/m LA Biplane Vol: 69.5 ml 29.04 ml/m  AORTIC VALVE                    PULMONIC VALVE AV Area (Vmax):    1.85 cm     PV Vmax:       1.51 m/s AV Area (Vmean):   1.90 cm     PV Vmean:      109.000 cm/s AV Area (VTI):     1.87 cm     PV VTI:        0.347 m AV Vmax:           244.50 cm/s  PV Peak grad:  9.1 mmHg AV Vmean:          157.500 cm/s PV Mean grad:  5.0 mmHg AV VTI:            0.491 m AV Peak Grad:      23.9 mmHg AV Mean Grad:      12.0 mmHg LVOT Vmax:         119.00 cm/s LVOT Vmean:        78.800 cm/s LVOT VTI:          0.241 m LVOT/AV VTI ratio: 0.49  AORTA Ao Root diam: 3.50 cm MITRAL VALVE MV Area (PHT): 2.94 cm    SHUNTS MV Peak grad:  4.2 mmHg    Systemic VTI:  0.24 m MV Mean grad:  2.0 mmHg    Systemic Diam: 2.20 cm MV Vmax:       1.03 m/s MV Vmean:      72.4 cm/s MV Decel Time: 258 msec MV E velocity: 77.10 cm/s MV A velocity: 87.30 cm/s MV E/A ratio:  0.88 Isaias Cowman MD Electronically signed by Isaias Cowman MD Signature Date/Time: 03/29/2020/4:03:13 PM    Final    CT RENAL STONE STUDY  Result Date: 03/29/2020 CLINICAL DATA:  Hematuria with unknown cause EXAM: CT ABDOMEN AND PELVIS WITHOUT CONTRAST TECHNIQUE: Multidetector CT imaging of the abdomen and pelvis was performed following the standard protocol without IV contrast. COMPARISON:  01/23/2020 FINDINGS: Lower chest: 7 mm right lower lobe pulmonary nodule, benign based on previous imaging. Hepatobiliary: No focal liver abnormality.Absent gallbladder. No bile duct dilatation. Pancreas: Unremarkable. Spleen:  Unremarkable. Adrenals/Urinary Tract: Negative adrenals. No hydronephrosis or stone. Chronic perinephric stranding, nonspecific. Thick walled appearance of the bladder without over distension, likely chronic outlet obstruction given the prostate. Stomach/Bowel: No obstruction. No appendicitis. Postoperative colon with rectosigmoid anastomosis. Vascular/Lymphatic: No acute vascular abnormality. Diffuse atheromatous calcification. No mass or adenopathy. Reproductive:Enlarged prostate up lifting the bladder base. Other: No ascites or pneumoperitoneum. Musculoskeletal: No acute abnormalities. Advanced, generalized spinal degeneration. IMPRESSION: 1. No acute finding.  No hydronephrosis or urolithiasis. 2. Enlarged prostate with thick walled bladder suggesting chronic outlet obstruction. No bladder dilatation or visible clot. Electronically Signed   By: Monte Fantasia M.D.   On: 03/29/2020 08:49    Scheduled Meds: . aspirin EC  81 mg Oral Daily  . carvedilol  12.5 mg Oral BID WC  . cholecalciferol  2,000 Units Oral Daily  . donepezil  5 mg Oral QHS  .  insulin aspart  0-15 Units Subcutaneous TID WC  . insulin glargine  12 Units Subcutaneous Daily  . potassium chloride  40 mEq Oral BID  . primidone  100 mg Oral BID  . rosuvastatin  40 mg Oral QHS  . vitamin B-12  1,000 mcg Oral Daily   Continuous Infusions: . cefTRIAXone (ROCEPHIN)  IV Stopped (03/30/20 0400)     LOS: 1 day   Time spent: 40 minutes   Joyann Spidle Loann Quill, MD [facility]  If 7PM-7AM, please contact night-coverage [email or web address] 03/30/2020, 12:43 PM

## 2020-03-30 NOTE — ED Notes (Signed)
Pt ambulated to restroom x2 assisted. Pt then repositioned onto recliner. Pt currently connected to cardiac, O2, and BP monitoring at this time.

## 2020-03-30 NOTE — ED Notes (Signed)
Meal tray given at this time 

## 2020-03-31 DIAGNOSIS — I214 Non-ST elevation (NSTEMI) myocardial infarction: Secondary | ICD-10-CM | POA: Diagnosis not present

## 2020-03-31 LAB — COMPREHENSIVE METABOLIC PANEL
ALT: 24 U/L (ref 0–44)
AST: 25 U/L (ref 15–41)
Albumin: 2.8 g/dL — ABNORMAL LOW (ref 3.5–5.0)
Alkaline Phosphatase: 49 U/L (ref 38–126)
Anion gap: 9 (ref 5–15)
BUN: 26 mg/dL — ABNORMAL HIGH (ref 8–23)
CO2: 27 mmol/L (ref 22–32)
Calcium: 9.4 mg/dL (ref 8.9–10.3)
Chloride: 100 mmol/L (ref 98–111)
Creatinine, Ser: 1.02 mg/dL (ref 0.61–1.24)
GFR, Estimated: >60 mL/min (ref >60)
Glucose, Bld: 137 mg/dL — ABNORMAL HIGH (ref 70–99)
Potassium: 3.8 mmol/L (ref 3.5–5.1)
Sodium: 136 mmol/L (ref 135–145)
Total Bilirubin: 0.7 mg/dL (ref 0.3–1.2)
Total Protein: 6.6 g/dL (ref 6.5–8.1)

## 2020-03-31 LAB — CBC
HCT: 37.6 % — ABNORMAL LOW (ref 39.0–52.0)
Hemoglobin: 12.9 g/dL — ABNORMAL LOW (ref 13.0–17.0)
MCH: 31.2 pg (ref 26.0–34.0)
MCHC: 34.3 g/dL (ref 30.0–36.0)
MCV: 90.8 fL (ref 80.0–100.0)
Platelets: 245 10*3/uL (ref 150–400)
RBC: 4.14 MIL/uL — ABNORMAL LOW (ref 4.22–5.81)
RDW: 13 % (ref 11.5–15.5)
WBC: 7.1 10*3/uL (ref 4.0–10.5)
nRBC: 0 % (ref 0.0–0.2)

## 2020-03-31 LAB — GLUCOSE, CAPILLARY
Glucose-Capillary: 200 mg/dL — ABNORMAL HIGH (ref 70–99)
Glucose-Capillary: 235 mg/dL — ABNORMAL HIGH (ref 70–99)
Glucose-Capillary: 187 mg/dL — ABNORMAL HIGH (ref 70–99)
Glucose-Capillary: 282 mg/dL — ABNORMAL HIGH (ref 70–99)

## 2020-03-31 LAB — URINE CULTURE: Culture: NO GROWTH

## 2020-03-31 LAB — MAGNESIUM: Magnesium: 2 mg/dL (ref 1.7–2.4)

## 2020-03-31 MED ORDER — HYDRALAZINE HCL 20 MG/ML IJ SOLN
10.0000 mg | Freq: Four times a day (QID) | INTRAMUSCULAR | Status: DC | PRN
  Administered 2020-04-01 – 2020-04-03 (×4): 10 mg via INTRAVENOUS
  Filled 2020-03-31 (×6): qty 1

## 2020-03-31 NOTE — Progress Notes (Signed)
[facility] Cardiology  SUBJECTIVE: Patient sitting up in chair, denies chest pain or shortness of breath   Vitals:   03/31/20 0400 03/31/20 0526 03/31/20 0848 03/31/20 1236  BP:  (!) 160/73 (!) 156/67 (!) 184/86  Pulse:  66 (!) 57 71  Resp: (!) 23 18 18 18   Temp:  97.8 F (36.6 C) 98.6 F (37 C) 97.7 F (36.5 C)  TempSrc:  Oral  Oral  SpO2:  94% 99% 94%  Weight: 123.2 kg     Height:         Intake/Output Summary (Last 24 hours) at 03/31/2020 1331 Last data filed at 03/31/2020 1012 Gross per 24 hour  Intake 929.25 ml  Output 1400 ml  Net -470.75 ml      PHYSICAL EXAM  General: Well developed, well nourished, in no acute distress HEENT:  Normocephalic and atramatic Neck:  No JVD.  Lungs: Clear bilaterally to auscultation and percussion. Heart: HRRR . Normal S1 and S2 without gallops or murmurs.  Abdomen: Bowel sounds are positive, abdomen soft and non-tender  Msk:  Back normal, normal gait. Normal strength and tone for age. Extremities: No clubbing, cyanosis or edema.   Neuro: Alert and oriented X 3. Psych:  Good affect, responds appropriately   LABS: Basic Metabolic Panel: Recent Labs    03/28/20 1535 03/29/20 0346 03/30/20 0316 03/31/20 0405  NA 130*   < > 134* 136  K 3.4*   < > 3.3* 3.8  CL 93*   < > 96* 100  CO2 25   < > 27 27  GLUCOSE 214*   < > 135* 137*  BUN 16   < > 28* 26*  CREATININE 1.08   < > 1.35* 1.02  CALCIUM 9.5   < > 9.1 9.4  MG 1.2*  --  1.6* 2.0  PHOS 2.3*  --  4.1  --    < > = values in this interval not displayed.   Liver Function Tests: Recent Labs    03/30/20 0316 03/31/20 0405  AST 33 25  ALT 20 24  ALKPHOS 45 49  BILITOT 1.0 0.7  PROT 6.6 6.6  ALBUMIN 2.7* 2.8*   No results for input(s): LIPASE, AMYLASE in the last 72 hours. CBC: Recent Labs    03/30/20 0316 03/31/20 0405  WBC 10.6* 7.1  HGB 12.6* 12.9*  HCT 36.8* 37.6*  MCV 90.4 90.8  PLT 229 245   Cardiac Enzymes: No results for input(s): CKTOTAL, CKMB, CKMBINDEX,  TROPONINI in the last 72 hours. BNP: Invalid input(s): POCBNP D-Dimer: No results for input(s): DDIMER in the last 72 hours. Hemoglobin A1C: Recent Labs    03/29/20 0346  HGBA1C 6.9*   Fasting Lipid Panel: No results for input(s): CHOL, HDL, LDLCALC, TRIG, CHOLHDL, LDLDIRECT in the last 72 hours. Thyroid Function Tests: No results for input(s): TSH, T4TOTAL, T3FREE, THYROIDAB in the last 72 hours.  Invalid input(s): FREET3 Anemia Panel: No results for input(s): VITAMINB12, FOLATE, FERRITIN, TIBC, IRON, RETICCTPCT in the last 72 hours.  No results found.   Echo LVEF 60 to 65%  TELEMETRY: Sinus rhythm:  ASSESSMENT AND PLAN:  Principal Problem:   NSTEMI (non-ST elevated myocardial infarction) (HCC) Active Problems:   Sleep apnea   BPH (benign prostatic hyperplasia)   COPD (chronic obstructive pulmonary disease) (HCC)   Syncope   Memory changes   Coronary artery disease   Hypokalemia   Type 2 diabetes mellitus with hyperglycemia (HCC)   Leukocytosis   Class 2 obesity  Hypomagnesemia   Hypophosphatemia   Hypertension   Hyponatremia   Urinary tract infection    1.  Non-ST elevation myocardial infarction, with elevated high-sensitivity troponin (1890, 2723, 4791),with abnormal ECG, in the absence of chest pain. Heparin discontinued due to gross hematuria.    The patient continues to deny chest pain or shortness of breath. 2.Mild aortic stenosis of uncertain clinical significance 3.Gross hematuria, exacerbated by heparin drip, with probable UTI/urosepsis.  Patient seen by urology, who recommends holding anticoagulation, with outpatient cystoscopy scheduled. 4.Syncope, of uncertain etiology, currently in sinus rhythm 5.COPD O2 by nasal cannula  Recommendation  1.Agree holding heparin drip for now in setting of gross hematuria 2.Defer urgent cardiac catheterization in the absence of chest pain 3.Continue aspirin and carvedilol 4.Continue  rosuvastatin 5.Resume olmesartan as needed 6.Hold clonidine for now  7.  Follow-up with [name] 1 week after discharge   Justin Cowman, MD, PhD, [facility] 03/31/2020 1:31 PM

## 2020-03-31 NOTE — Progress Notes (Signed)
PROGRESS NOTE    Justin Griffith  DSK:[id number] DOB: 09-12-1940 DOA: 03/28/2020 PCP: Guadalupe Maple, MD   Brief Narrative:  Patient is 80 year old male with past medical history significant of osteoarthritis, asthma, chronic back pain, colon polyp, COPD, emphysema of the lung, heart murmur, hernia, hyperlipidemia, hypertension, colon cancer, tobacco abuse, sleep apnea-on CPAP, type 2 diabetes mellitus with proteinuria presents to emergency department with syncopal episode at home while in the bathroom hitting his forehead, sustaining a hematoma in the area and one episode of emesis.  ED course: Afebrile, vital signs within normal limits, maintaining oxygen saturation 92% on room air, EKG shows ST-T wave changes on anterior lateral leads.  Troponin was 1890 and then 2723, BNP: 256, UA shows pyuria and bacteriuria.  CBC showed a white count of 17,000.  Hemoglobin 13.7, platelet: 198, PT/INR: WNL sodium 130, potassium 3.4, renal function within normal limit.  Chest x-ray shows pulmonary vascular congestion, peribronchial thickening and left basilar atelectasis/scarring again noted.  CT head did not show any acute findings.  Cardiology consulted and patient admitted for further evaluation and management.  Assessment & Plan:   NSTEMI: -Patient presented with syncopal episode with elevated troponin.  Reviewed EKG. -Patient started on heparin gtt in ED which was discontinued later due to frank hematuria. -Cardiology consulted-appreciate input-recommended against for urgent cardiac catheterization in the absence of chest pain and in the presence of gross hematuria on heparin drip. -Continue aspirin, statin, Coreg. -Echo shows ejection fraction of 60 to 65% with grade 1 diastolic dysfunction. -Patient remained chest pain-free.  Continue to monitor.    Syncope: -CT head negative for acute findings.  Echo as above.  Carotid Doppler ultrasound showed bilateral carotid bifurcation plaque resulting in  less than 50% diameter ICA stenosis. -TSH, free T4: WNL from 12/7 -On fall precautions. -Consult PT/OT-recommend SNF-TOC consulted.  UTI/gross materia: -UA concerning for infection.  Patient started on Rocephin in ED.  Urine culture: No growth.  Leukocytosis: Resolved.  He remained afebrile. -CT renal study: Negative for nephrolithiasis or hydronephrosis.  DC heparin GTT. -Urology consulted recommended to continue to hold anticoagulation, continue antibiotics for 10 days and follow urine culture result. -Outpatient cystoscopy appointment with Dr. Erlene Quan on 04/03/2020.  Appreciate urology's recommendations. -Continue Rocephin  AKI: Likely in the setting of dehydration and infection.  Resolved with IV fluids.  Hypertension: Blood pressure elevated this morning. -Continue Coreg, losartan.  Continue to hold alpha-blocker due to syncopal/dizziness. Continue to hold clonidine as per cardiology's recommendations -Added hydralazine as needed for blood pressure more than 160/100 -Continue to monitor blood pressure closely  Hyponatremia: Sodium level improved from 130-134-136.  Continue to monitor  Hypokalemia: Resolved  Hypomagnesemia: Resolved  BPH: Flomax is on hold due to syncope/dizziness  COPD: No wheezing noted on exam.  Continue supplemental oxygen as needed.  Continue bronchodilators as needed  Memory changes: Continue donepezil  Morbid obesity with BMI of 39: -Diet modification/exercise and weight loss recommended.  Type 2 diabetes mellitus: Well controlled.  A1c 6.9%. -We will hold Metformin.  Continue sliding scale insulin and Lantus 12 units daily.  Hypophosphatemia: Replenished.  Resolved.  DVT prophylaxis: SCD, no chemical anticoagulation due to frank hematuria Code Status: Full code Family Communication:  None present at bedside.  Plan of care discussed with patient in length and he verbalized understanding and agreed with it.  Disposition Plan: To be  determined  Consultants:   Cardiology  urology  Procedures:  CT renal study Echo Carotid doppler  Antimicrobials:  Rocephin  Dispo: The patient is from: Home              Anticipated d/c is to: SNF              Anticipated d/c date is: 2 days              Patient currently is not medically stable to d/c.   Subjective: Patient seen and examined.  RN at the bedside.  Tells me that he has burning when he pees.  Has Foley catheter.  Feeling better today.  No acute events overnight.  No chest pain, leg swelling, shortness of breath, palpitation.  Remained afebrile.  Objective: Vitals:   03/31/20 0400 03/31/20 0526 03/31/20 0848 03/31/20 1236  BP:  (!) 160/73 (!) 156/67 (!) 184/86  Pulse:  66 (!) 57 71  Resp: (!) 23 18 18 18   Temp:  97.8 F (36.6 C) 98.6 F (37 C) 97.7 F (36.5 C)  TempSrc:  Oral  Oral  SpO2:  94% 99% 94%  Weight: 123.2 kg     Height:        Intake/Output Summary (Last 24 hours) at 03/31/2020 1331 Last data filed at 03/31/2020 1012 Gross per 24 hour  Intake 929.25 ml  Output 1400 ml  Net -470.75 ml   Filed Weights   03/28/20 1526 03/31/20 0400  Weight: 125.2 kg 123.2 kg    Examination:  General exam: Appears calm and comfortable,, on room air, communicating well, agrees  respiratory system: Decreased breath sounds noted on the bases.  No wheezing rhonchi or crackles. Cardiovascular system: S1 & S2 heard, RRR. No JVD, murmurs, rubs, gallops or clicks. No pedal edema. Gastrointestinal system: Abdomen is nondistended, soft and nontender. No organomegaly or masses felt. Normal bowel sounds heard. Central nervous system: Alert and oriented. No focal neurological deficits. Extremities: Symmetric 5 x 5 power. Skin: No rashes, lesions or ulcers Psychiatry: Judgement and insight appear normal. Mood & affect appropriate.    Data Reviewed: I have personally reviewed following labs and imaging studies  CBC: Recent Labs  Lab 03/28/20 1535  03/29/20 0346 03/29/20 1413 03/30/20 0316 03/31/20 0405  WBC 17.0* 15.1* 13.9* 10.6* 7.1  HGB 13.7 13.6 12.5* 12.6* 12.9*  HCT 39.8 39.0 36.4* 36.8* 37.6*  MCV 90.0 90.3 90.3 90.4 90.8  PLT 198 202 214 229 161   Basic Metabolic Panel: Recent Labs  Lab 03/28/20 1535 03/29/20 0346 03/30/20 0316 03/31/20 0405  NA 130* 134* 134* 136  K 3.4* 3.6 3.3* 3.8  CL 93* 96* 96* 100  CO2 25 27 27 27   GLUCOSE 214* 144* 135* 137*  BUN 16 21 28* 26*  CREATININE 1.08 1.85* 1.35* 1.02  CALCIUM 9.5 9.4 9.1 9.4  MG 1.2*  --  1.6* 2.0  PHOS 2.3*  --  4.1  --    GFR: Estimated Creatinine Clearance: 76.1 mL/min (by C-G formula based on SCr of 1.02 mg/dL). Liver Function Tests: Recent Labs  Lab 03/30/20 0316 03/31/20 0405  AST 33 25  ALT 20 24  ALKPHOS 45 49  BILITOT 1.0 0.7  PROT 6.6 6.6  ALBUMIN 2.7* 2.8*   No results for input(s): LIPASE, AMYLASE in the last 168 hours. No results for input(s): AMMONIA in the last 168 hours. Coagulation Profile: Recent Labs  Lab 03/28/20 1818  INR 1.2   Cardiac Enzymes: No results for input(s): CKTOTAL, CKMB, CKMBINDEX, TROPONINI in the last 168 hours. BNP (last 3 results) No results for input(s): PROBNP in  the last 8760 hours. HbA1C: Recent Labs    03/29/20 0346  HGBA1C 6.9*   CBG: Recent Labs  Lab 03/30/20 1310 03/30/20 1645 03/30/20 2015 03/31/20 0845 03/31/20 1236  GLUCAP 185* 166* 178* 200* 235*   Lipid Profile: No results for input(s): CHOL, HDL, LDLCALC, TRIG, CHOLHDL, LDLDIRECT in the last 72 hours. Thyroid Function Tests: No results for input(s): TSH, T4TOTAL, FREET4, T3FREE, THYROIDAB in the last 72 hours. Anemia Panel: No results for input(s): VITAMINB12, FOLATE, FERRITIN, TIBC, IRON, RETICCTPCT in the last 72 hours. Sepsis Labs: Recent Labs  Lab 03/28/20 1818  LATICACIDVEN 1.8    Recent Results (from the past 240 hour(s))  Resp Panel by RT-PCR (Flu A&B, Covid) Nasopharyngeal Swab     Status: None   Collection  Time: 03/28/20  6:18 PM   Specimen: Nasopharyngeal Swab; Nasopharyngeal(NP) swabs in vial transport medium  Result Value Ref Range Status   SARS Coronavirus 2 by RT PCR NEGATIVE NEGATIVE Final    Comment: (NOTE) SARS-CoV-2 target nucleic acids are NOT DETECTED.  The SARS-CoV-2 RNA is generally detectable in upper respiratory specimens during the acute phase of infection. The lowest concentration of SARS-CoV-2 viral copies this assay can detect is 138 copies/mL. A negative result does not preclude SARS-Cov-2 infection and should not be used as the sole basis for treatment or other patient management decisions. A negative result may occur with  improper specimen collection/handling, submission of specimen other than nasopharyngeal swab, presence of viral mutation(s) within the areas targeted by this assay, and inadequate number of viral copies(<138 copies/mL). A negative result must be combined with clinical observations, patient history, and epidemiological information. The expected result is Negative.  Fact Sheet for Patients:  EntrepreneurPulse.com.au  Fact Sheet for Healthcare Providers:  IncredibleEmployment.be  This test is no t yet approved or cleared by the [place] FDA and  has been authorized for detection and/or diagnosis of SARS-CoV-2 by FDA under an Emergency Use Authorization (EUA). This EUA will remain  in effect (meaning this test can be used) for the duration of the COVID-19 declaration under Section 564(b)(1) of the Act, 21 U.S.C.section 360bbb-3(b)(1), unless the authorization is terminated  or revoked sooner.       Influenza A by PCR NEGATIVE NEGATIVE Final   Influenza B by PCR NEGATIVE NEGATIVE Final    Comment: (NOTE) The Xpert Xpress SARS-CoV-2/FLU/RSV plus assay is intended as an aid in the diagnosis of influenza from Nasopharyngeal swab specimens and should not be used as a sole basis for treatment. Nasal washings  and aspirates are unacceptable for Xpert Xpress SARS-CoV-2/FLU/RSV testing.  Fact Sheet for Patients: EntrepreneurPulse.com.au  Fact Sheet for Healthcare Providers: IncredibleEmployment.be  This test is not yet approved or cleared by the [place] FDA and has been authorized for detection and/or diagnosis of SARS-CoV-2 by FDA under an Emergency Use Authorization (EUA). This EUA will remain in effect (meaning this test can be used) for the duration of the COVID-19 declaration under Section 564(b)(1) of the Act, 21 U.S.C. section 360bbb-3(b)(1), unless the authorization is terminated or revoked.  Performed at [facility], [address] [place], [place] [address]   Urine Culture     Status: None   Collection Time: 03/29/20  7:24 PM   Specimen: Urine, Random  Result Value Ref Range Status   Specimen Description   Final    URINE, RANDOM Performed at [facility], [address] [place], [place] [address]    Special Requests   Final  NONE Performed at [facility], [address] [place], [place] [address]    Culture   Final    NO GROWTH Performed at [facility], [place] [address] [place], [place] [address]    Report Status 03/31/2020 FINAL  Final      Radiology Studies: No results found.  Scheduled Meds: . aspirin EC  81 mg Oral Daily  . carvedilol  12.5 mg Oral BID WC  . cholecalciferol  2,000 Units Oral Daily  . donepezil  5 mg Oral QHS  . insulin aspart  0-15 Units Subcutaneous TID WC  . insulin glargine  12 Units Subcutaneous Daily  . irbesartan  300 mg Oral Daily  . primidone  100 mg Oral BID  . rosuvastatin  40 mg Oral QHS  . vitamin B-12  1,000 mcg Oral Daily   Continuous Infusions: . cefTRIAXone (ROCEPHIN)  IV Stopped (03/31/20 0357)     LOS: 2 days   Time spent: 40 minutes   Jenet Durio Loann Quill, MD [facility]  If 7PM-7AM, please contact  night-coverage [email or web address] 03/31/2020, 1:31 PM

## 2020-03-31 NOTE — Evaluation (Signed)
Physical Therapy Evaluation Patient Details Name: Justin Griffith MRN: [id number] DOB: [birth year] Today's Date: 03/31/2020   History of Present Illness  Patient is 80 year old male with past medical history significant of osteoarthritis, asthma, chronic back pain, colon polyp, COPD, emphysema of the lung, heart murmur, hernia, hyperlipidemia, hypertension, colon cancer, tobacco abuse, sleep apnea-on CPAP, type 2 diabetes mellitus with proteinuria presents to emergency department with syncopal episode at home while in the bathroom hitting his forehead, sustaining a hematoma in the area and one episode of emesis. Found to have NSTEMI, UTI, AKI.    Clinical Impression  PT evaluation completed. Patient lethargic initially but has increased participation and alertness with encouragement and progression of activity. Patient needs Min A for bed mobility and Min A for sit to stand transfers. Fair standing balance with rolling walker for UE support. Patient leans anteriorly with flexed posture in standing position and required minimal assistance to keep rolling walker for moving forward. No dizziness is reported with upright activity, however reports overall feeling poorly today and having pain with urination while standing. Recommend to continue PT to maximize independence and address remaining functional limitations. SNF is recommended at discharge at this time.     Follow Up Recommendations SNF    Equipment Recommendations   (to be determined at next level of care)    Recommendations for Other Services       Precautions / Restrictions Precautions Precautions: Fall Restrictions Weight Bearing Restrictions: No      Mobility  Bed Mobility Overal bed mobility: Needs Assistance Bed Mobility: Supine to Sit;Sit to Supine     Supine to sit: Min assist Sit to supine: Min assist   General bed mobility comments: assistance for trunk support to sit upright. assistance for LE support to return to bed.  verbal cues for sequencing and technique    Transfers Overall transfer level: Needs assistance Equipment used: Rolling walker (2 wheeled) Transfers: Sit to/from Stand   Stand pivot transfers: Min assist       General transfer comment: lifting assistance required for standing. verbal cues for technique.  Ambulation/Gait             General Gait Details: did not progress ambulation due to lethargy  Stairs            Wheelchair Mobility    Modified Rankin (Stroke Patients Only)       Balance Overall balance assessment: Needs assistance Sitting-balance support: Feet supported;Single extremity supported Sitting balance-Leahy Scale: Fair Sitting balance - Comments: patient did have one loss of balance in sitting position after standing that was self corrected   Standing balance support: Bilateral upper extremity supported Standing balance-Leahy Scale: Fair Standing balance comment: patient relying heavily on rolling walker for support in standing                             Pertinent Vitals/Pain Pain Assessment: No/denies pain    Home Living Family/patient expects to be discharged to:: Private residence Living Arrangements: Spouse/significant other Available Help at Discharge: Family;Available PRN/intermittently Type of Home: House Home Access: Stairs to enter   [employer] of Steps: 1 Home Layout: One level Home Equipment: Walker - 2 wheels      Prior Function Level of Independence: Independent with assistive device(s)         Comments: Mod I using rolling walker as needed. Falls reported in the past 6 months     Hand Dominance  Extremity/Trunk Assessment   Upper Extremity Assessment Upper Extremity Assessment: Generalized weakness    Lower Extremity Assessment Lower Extremity Assessment: Generalized weakness       Communication   Communication: HOH  Cognition Arousal/Alertness: Lethargic Behavior During  Therapy: WFL for tasks assessed/performed Overall Cognitive Status: Within Functional Limits for tasks assessed                                        General Comments      Exercises     Assessment/Plan    PT Assessment Patient needs continued PT services  PT Problem List Decreased strength;Decreased activity tolerance;Decreased balance;Decreased mobility;Decreased safety awareness;Decreased knowledge of precautions       PT Treatment Interventions DME instruction;Gait training;Stair training;Functional mobility training;Therapeutic activities;Therapeutic exercise;Balance training;Neuromuscular re-education    PT Goals (Current goals can be found in the Care Plan section)  Acute Rehab PT Goals Patient Stated Goal: to feel better PT Goal Formulation: With patient Time For Goal Achievement: 04/14/20 Potential to Achieve Goals: Good    Frequency Min 2X/week   Barriers to discharge        Co-evaluation               AM-PAC PT "6 Clicks" Mobility  Outcome Measure Help needed turning from your back to your side while in a flat bed without using bedrails?: A Little Help needed moving from lying on your back to sitting on the side of a flat bed without using bedrails?: A Little Help needed moving to and from a bed to a chair (including a wheelchair)?: A Little Help needed standing up from a chair using your arms (e.g., wheelchair or bedside chair)?: A Little Help needed to walk in hospital room?: A Little Help needed climbing 3-5 steps with a railing? : A Lot 6 Click Score: 17    End of Session   Activity Tolerance: Patient tolerated treatment well Patient left: in bed;with call bell/phone within reach;with bed alarm set Nurse Communication: Mobility status PT Visit Diagnosis: History of falling (Z91.81);Muscle weakness (generalized) (M62.81);Unsteadiness on feet (R26.81)    Time: 5625-6389 PT Time Calculation (min) (ACUTE ONLY): 28  min   Charges:   PT Evaluation $PT Eval Moderate Complexity: 1 Mod PT Treatments $Therapeutic Activity: 8-22 mins        Justin Griffith, PT, MPT   Justin Griffith 03/31/2020, 12:44 PM

## 2020-04-01 DIAGNOSIS — I214 Non-ST elevation (NSTEMI) myocardial infarction: Secondary | ICD-10-CM | POA: Diagnosis not present

## 2020-04-01 LAB — GLUCOSE, CAPILLARY
Glucose-Capillary: 177 mg/dL — ABNORMAL HIGH (ref 70–99)
Glucose-Capillary: 185 mg/dL — ABNORMAL HIGH (ref 70–99)
Glucose-Capillary: 196 mg/dL — ABNORMAL HIGH (ref 70–99)
Glucose-Capillary: 178 mg/dL — ABNORMAL HIGH (ref 70–99)

## 2020-04-01 LAB — BASIC METABOLIC PANEL
Anion gap: 11 (ref 5–15)
BUN: 18 mg/dL (ref 8–23)
CO2: 29 mmol/L (ref 22–32)
Calcium: 9.8 mg/dL (ref 8.9–10.3)
Chloride: 95 mmol/L — ABNORMAL LOW (ref 98–111)
Creatinine, Ser: 1 mg/dL (ref 0.61–1.24)
GFR, Estimated: >60 mL/min (ref >60)
Glucose, Bld: 173 mg/dL — ABNORMAL HIGH (ref 70–99)
Potassium: 3.8 mmol/L (ref 3.5–5.1)
Sodium: 135 mmol/L (ref 135–145)

## 2020-04-01 LAB — CBC
HCT: 39 % (ref 39.0–52.0)
Hemoglobin: 13.6 g/dL (ref 13.0–17.0)
MCH: 31.3 pg (ref 26.0–34.0)
MCHC: 34.9 g/dL (ref 30.0–36.0)
MCV: 89.9 fL (ref 80.0–100.0)
Platelets: 285 10*3/uL (ref 150–400)
RBC: 4.34 MIL/uL (ref 4.22–5.81)
RDW: 12.9 % (ref 11.5–15.5)
WBC: 8.1 10*3/uL (ref 4.0–10.5)
nRBC: 0 % (ref 0.0–0.2)

## 2020-04-01 LAB — RESP PANEL BY RT-PCR (FLU A&B, COVID) ARPGX2
Influenza A by PCR: NEGATIVE
Influenza B by PCR: NEGATIVE
SARS Coronavirus 2 by RT PCR: NEGATIVE

## 2020-04-01 MED ORDER — FUROSEMIDE 20 MG PO TABS
20.0000 mg | ORAL_TABLET | Freq: Every day | ORAL | Status: DC
  Administered 2020-04-01 – 2020-04-03 (×3): 20 mg via ORAL
  Filled 2020-04-01 (×3): qty 1

## 2020-04-01 MED ORDER — OXYCODONE HCL 5 MG PO TABS
10.0000 mg | ORAL_TABLET | Freq: Four times a day (QID) | ORAL | Status: DC | PRN
  Administered 2020-04-01 – 2020-04-03 (×7): 10 mg via ORAL
  Filled 2020-04-01 (×7): qty 2

## 2020-04-01 NOTE — Care Management Important Message (Signed)
Important Message  Patient Details  Name: [name] MRN: [id number] Date of Birth: 1940-11-20   Medicare Important Message Given:  Yes     Dannette Barbara 04/01/2020, 12:21 PM

## 2020-04-01 NOTE — Progress Notes (Signed)
[facility] Cardiology  SUBJECTIVE: Patient sitting up in chair, denies chest pain or shortness of breath   Vitals:   03/31/20 1846 03/31/20 1946 03/31/20 2310 04/01/20 0400  BP: (!) 162/78 (!) 187/70  (!) 183/87  Pulse: 76 63    Resp: 18 17    Temp: 98.4 F (36.9 C) 98.5 F (36.9 C)  98.2 F (36.8 C)  TempSrc: Oral Oral  Oral  SpO2: 93% 94% 95% 94%  Weight:      Height:         Intake/Output Summary (Last 24 hours) at 04/01/2020 0908 Last data filed at 04/01/2020 0998 Gross per 24 hour  Intake 120 ml  Output 2900 ml  Net -2780 ml      PHYSICAL EXAM  General: Well developed, well nourished, in no acute distress HEENT:  Normocephalic and atramatic Neck:  No JVD.  Lungs: Clear bilaterally to auscultation and percussion. Heart: HRRR . Normal S1 and S2 without gallops or murmurs.  Abdomen: Bowel sounds are positive, abdomen soft and non-tender  Msk:  Back normal, normal gait. Normal strength and tone for age. Extremities: No clubbing, cyanosis or edema.   Neuro: Alert and oriented X 3. Psych:  Good affect, responds appropriately   LABS: Basic Metabolic Panel: Recent Labs    03/30/20 0316 03/31/20 0405 04/01/20 0218  NA 134* 136 135  K 3.3* 3.8 3.8  CL 96* 100 95*  CO2 27 27 29   GLUCOSE 135* 137* 173*  BUN 28* 26* 18  CREATININE 1.35* 1.02 1.00  CALCIUM 9.1 9.4 9.8  MG 1.6* 2.0  --   PHOS 4.1  --   --    Liver Function Tests: Recent Labs    03/30/20 0316 03/31/20 0405  AST 33 25  ALT 20 24  ALKPHOS 45 49  BILITOT 1.0 0.7  PROT 6.6 6.6  ALBUMIN 2.7* 2.8*   No results for input(s): LIPASE, AMYLASE in the last 72 hours. CBC: Recent Labs    03/31/20 0405 04/01/20 0218  WBC 7.1 8.1  HGB 12.9* 13.6  HCT 37.6* 39.0  MCV 90.8 89.9  PLT 245 285   Cardiac Enzymes: No results for input(s): CKTOTAL, CKMB, CKMBINDEX, TROPONINI in the last 72 hours. BNP: Invalid input(s): POCBNP D-Dimer: No results for input(s): DDIMER in the last 72 hours. Hemoglobin  A1C: No results for input(s): HGBA1C in the last 72 hours. Fasting Lipid Panel: No results for input(s): CHOL, HDL, LDLCALC, TRIG, CHOLHDL, LDLDIRECT in the last 72 hours. Thyroid Function Tests: No results for input(s): TSH, T4TOTAL, T3FREE, THYROIDAB in the last 72 hours.  Invalid input(s): FREET3 Anemia Panel: No results for input(s): VITAMINB12, FOLATE, FERRITIN, TIBC, IRON, RETICCTPCT in the last 72 hours.  No results found.   Echo LVEF 60 to 65%  TELEMETRY: Sinus rhythm:  ASSESSMENT AND PLAN:  Principal Problem:   NSTEMI (non-ST elevated myocardial infarction) (HCC) Active Problems:   Sleep apnea   BPH (benign prostatic hyperplasia)   COPD (chronic obstructive pulmonary disease) (HCC)   Syncope   Memory changes   Coronary artery disease   Hypokalemia   Type 2 diabetes mellitus with hyperglycemia (HCC)   Leukocytosis   Class 2 obesity   Hypomagnesemia   Hypophosphatemia   Hypertension   Hyponatremia   Urinary tract infection    1.  Non-ST elevation myocardial infarction, with elevated high-sensitivity troponin (1890, 2723, 4791),with abnormal ECG, in the absence of chest pain. Heparin discontinued due to gross hematuria.  The patient continues to deny  recurrent chest pain or shortness of breath. 2.Mild aortic stenosis of uncertain clinical significance 3.Gross hematuria, exacerbated by heparin drip, with probable UTI/urosepsis.Patient seen by urology, who recommends holding anticoagulation, with outpatient cystoscopy scheduled. 4.Syncope, of uncertain etiology, currently in sinus rhythm, no significant arrhythmia noted on telemetry 5.COPD, oxygenating well on room air 6.  Essential hypertension, blood pressure elevated on current BP medications 7.  Pedal edema, mild  Recommendations  1.  Agree holding heparin drip for now in setting of gross hematuria 2.Defer urgent cardiac catheterization in the absence of chest pain 3.Continue aspirin and  carvedilol 4.Continue rosuvastatin 5.Resume olmesartan as needed 6.Hold clonidine for now  7.  Resume furosemide 20 mg daily 8.  Probable discharge to SNF pending 9.  Follow-up with [name] 1 week after discharge       Justin Cowman, MD, PhD, [facility] 04/01/2020 9:08 AM

## 2020-04-01 NOTE — Progress Notes (Signed)
Mobility Specialist - Progress Note   04/01/20 1200  Mobility  Activity Ambulated in room  Range of Motion/Exercises Right leg;Left leg (AP, SLR, march)  Level of Assistance Standby assist, set-up cues, supervision of patient - no hands on  Assistive Device Front wheel walker  Distance Ambulated (ft) 25 ft  Mobility Response Tolerated well  Mobility performed by Mobility specialist  $Mobility charge 1 Mobility    Pre-mobility: 65 HR, 95% SpO2 During mobility: 71 HR, 94% SpO2 Post-mobility: 69 HR, 95% SpO2   Pt was lying in bed upon arrival utilizing room air. Pt agreed to session. Pt HOH and without hearing aid this date. Pt denied nausea and fatigue, but did c/o pain in back area d/t arthritis (inquiring about personal medications being sent in). Pt was able to exit R side of bed with supervision. Upon sitting EOB, a seated rest break was taken for pain management. Pt stood to RW and ambulated 25' in room with no LOB noted. VC needed for proper biomechanics, sequencing, and technique. Pt denied weakness or exhaustion, but further OOB mobility limited d/t pt c/o feeling "woozy". Pt sat back EOB and performed seated exercises: ankle pumps, straight leg raises, and seated march. Overall, pt tolerated session well. Pt inquiring about SNF placement and d/c plans, CSW entered at the end of session to follow up with details. Pt was left EOB with all needs in reach and alarm set.    Justin Griffith Mobility Specialist 04/01/20, 12:28 PM

## 2020-04-01 NOTE — NC FL2 (Signed)
[place] LEVEL OF CARE SCREENING TOOL     IDENTIFICATION  Patient Name: Justin Griffith Birthdate: 08-Sep-1940 Sex: male Admission Date (Current Location): 03/28/2020  [place] and [place] Number:  Engineering geologist and Address:  [facility], [address] [place], [place] [address]      Provider Number: [id number]  Attending Physician Name and Address:  Mckinley Jewel, MD  Relative Name and Phone Number:       Current Level of Care: Hospital Recommended Level of Care: [place] Prior Approval Number:    Date Approved/Denied:   PASRR Number: [id number]  Discharge Plan: SNF    Current Diagnoses: Patient Active Problem List   Diagnosis Date Noted  . Hypomagnesemia 03/29/2020  . Hypophosphatemia 03/29/2020  . Hypertension 03/29/2020  . Hyponatremia 03/29/2020  . Urinary tract infection 03/29/2020  . NSTEMI (non-ST elevated myocardial infarction) ([place]) 03/28/2020  . Hypokalemia 03/28/2020  . Type 2 diabetes mellitus with hyperglycemia ([place]) 03/28/2020  . Leukocytosis 03/28/2020  . Class 2 obesity 03/28/2020  . Elevated TSH 02/25/2020  . Poor mobility 02/02/2020  . Microscopic hematuria   . Coronary artery disease 01/23/2020  . History of CVA (cerebrovascular accident) 01/23/2020  . Memory changes 01/01/2020  . Syncope 12/15/2019  . Elevated troponin 12/14/2019  . Pain in both lower extremities 07/14/2019  . Peripheral vascular disease with stasis dermatitis 05/23/2019  . B12 deficiency 04/23/2019  . Constipation 04/21/2019  . Tremor 11/14/2018  . Morbid obesity ([place]) 09/07/2017  . COPD (chronic obstructive pulmonary disease) ([place]) 04/29/2017  . Intertrigo 02/02/2017  . BPH (benign prostatic hyperplasia) 07/23/2016  . Advanced care planning/counseling discussion 07/23/2016  . Eosinophilic esophagitis 15/17/6160  . PAD (peripheral artery disease) ([place]) 01/08/2016  . Gastroesophageal reflux disease  without esophagitis 01/06/2016  . Hyperlipidemia associated with type 2 diabetes mellitus ([place]) 01/16/2015  . Sleep apnea 01/16/2015  . BMI 40.0-44.9, adult ([place]) 01/16/2015  . Hypertension associated with diabetes ([place]) 10/11/2014  . Type 2 diabetes mellitus with proteinuria ([place]) 10/11/2014  . Back pain 10/11/2014  . History of colon cancer     Orientation RESPIRATION BLADDER Height & Weight     Self,Time,Situation,Place  Normal External catheter,Incontinent (placed 1/6) Weight: 271 lb 8 oz (123.2 kg) Height:  5\' 10"  (177.8 cm)  BEHAVIORAL SYMPTOMS/MOOD NEUROLOGICAL BOWEL NUTRITION STATUS      Continent Diet (heart healthy, carb modified)  AMBULATORY STATUS COMMUNICATION OF NEEDS Skin   Limited Assist Verbally Normal                       Personal Care Assistance Level of Assistance  Bathing,Feeding,Dressing Bathing Assistance: Limited assistance Feeding assistance: Independent Dressing Assistance: Limited assistance     Functional Limitations Info  Sight,Hearing,Speech Sight Info: Adequate Hearing Info: Adequate Speech Info: Adequate    SPECIAL CARE FACTORS FREQUENCY  OT (By licensed OT),PT (By licensed PT)     PT Frequency: 5x OT Frequency: 5x            Contractures Contractures Info: Not present    Additional Factors Info  Code Status,Allergies Code Status Info: full code Allergies Info: Farxiga (Dapagliflozin), Jardiance (Empagliflozin)           Current Medications (04/01/2020):  This is the current hospital active medication list Current Facility-Administered Medications  Medication Dose Route Frequency Provider Last Rate Last Admin  . acetaminophen (TYLENOL) tablet 650 mg  650 mg Oral Q4H PRN Reubin Milan, MD  650 mg at 03/28/20 2016  . aspirin EC tablet 81 mg  81 mg Oral Daily Reubin Milan, MD   81 mg at 03/31/20 0900  . carvedilol (COREG) tablet 12.5 mg  12.5 mg Oral BID WC Paraschos, Alexander, MD   12.5 mg at 03/31/20 1828   . cefTRIAXone (ROCEPHIN) 2 g in sodium chloride 0.9 % 100 mL IVPB  2 g Intravenous Q24H Reubin Milan, MD 200 mL/hr at 04/01/20 0411 2 g at 04/01/20 0411  . cholecalciferol (VITAMIN D3) tablet 2,000 Units  2,000 Units Oral Daily Reubin Milan, MD   2,000 Units at 03/31/20 0900  . donepezil (ARICEPT) tablet 5 mg  5 mg Oral QHS Reubin Milan, MD   5 mg at 03/31/20 2050  . furosemide (LASIX) tablet 20 mg  20 mg Oral Daily Paraschos, Alexander, MD      . hydrALAZINE (APRESOLINE) injection 10 mg  10 mg Intravenous Q6H PRN Pahwani, Rinka R, MD   10 mg at 04/01/20 0400  . insulin aspart (novoLOG) injection 0-15 Units  0-15 Units Subcutaneous TID WC Reubin Milan, MD   3 Units at 03/31/20 1828  . insulin glargine (LANTUS) injection 12 Units  12 Units Subcutaneous Daily Pahwani, Rinka R, MD   12 Units at 03/31/20 0859  . ipratropium-albuterol (DUONEB) 0.5-2.5 (3) MG/3ML nebulizer solution 3 mL  3 mL Nebulization Q6H PRN Reubin Milan, MD      . irbesartan (AVAPRO) tablet 300 mg  300 mg Oral Daily Pahwani, Rinka R, MD   300 mg at 03/31/20 0900  . nitroGLYCERIN (NITROSTAT) SL tablet 0.4 mg  0.4 mg Sublingual Q5 Min x 3 PRN Reubin Milan, MD      . ondansetron [facility]) injection 4 mg  4 mg Intravenous Q6H PRN Reubin Milan, MD   4 mg at 03/29/20 0753  . primidone (MYSOLINE) tablet 100 mg  100 mg Oral BID Reubin Milan, MD   100 mg at 03/31/20 2050  . rosuvastatin (CRESTOR) tablet 40 mg  40 mg Oral QHS Reubin Milan, MD   40 mg at 03/31/20 2050  . vitamin B-12 (CYANOCOBALAMIN) tablet 1,000 mcg  1,000 mcg Oral Daily Reubin Milan, MD   1,000 mcg at 03/31/20 0900     Discharge Medications: Please see discharge summary for a list of discharge medications.  Relevant Imaging Results:  Relevant Lab Results:   Additional Information TMA:[id number]  Eileen Stanford, LCSW

## 2020-04-01 NOTE — TOC Progression Note (Signed)
Transition of Care [facility]) - Progression Note    Patient Details  Name: Justin Griffith MRN: [id number] Date of Birth: 17-Aug-1940  Transition of Care [facility]) CM/SW Contact  Eileen Stanford, LCSW Phone Number: 04/01/2020, 1:38 PM  Clinical Narrative:    Pt prefers [facility]--bed offer made. MD and RN notified of need for updated COVID test.   Expected Discharge Plan: Skilled Nursing Facility Barriers to Discharge: Continued Medical Work up  Expected Discharge Plan and Services Expected Discharge Plan: [place] In-house Referral: Clinical Social Work   Post Acute Care Choice: [place] Living arrangements for the past 2 months: Single Family Home                                       Social Determinants of Health (SDOH) Interventions    Readmission Risk Interventions No flowsheet data found.

## 2020-04-01 NOTE — TOC Initial Note (Signed)
Transition of Care [facility]) - Initial/Assessment Note    Patient Details  Name: Justin Griffith MRN: [id number] Date of Birth: 07-19-1940  Transition of Care [facility]) CM/SW Contact:    Eileen Stanford, LCSW Phone Number: 04/01/2020, 9:57 AM  Clinical Narrative:  CSW spoke with pt and pt is agreeable to SNF. Pt lives with his spouse. Pt did not have a preference for SNF however states he would like to go somewhere in [place] or [place]. Pt is agreeable to referral fax out to those facilities. CSW will provide bed offers once available.                  Expected Discharge Plan: Skilled Nursing Facility Barriers to Discharge: Continued Medical Work up   Patient Goals and CMS Choice Patient states their goals for this hospitalization and ongoing recovery are:: to get better   Choice offered to / list presented to : Patient  Expected Discharge Plan and Services Expected Discharge Plan: [place] In-house Referral: Clinical Social Work   Post Acute Care Choice: [place] Living arrangements for the past 2 months: [place]                                      Prior Living Arrangements/Services Living arrangements for the past 2 months: Single Family Home Lives with:: Spouse Patient language and need for interpreter reviewed:: Yes Do you feel safe going back to the place where you live?: Yes      Need for Family Participation in Patient Care: Yes (Comment) Care giver support system in place?: Yes (comment)   Criminal Activity/Legal Involvement Pertinent to Current Situation/Hospitalization: No - Comment as needed  Activities of Daily Living Home Assistive Devices/Equipment: None ADL Screening (condition at time of admission) Patient's cognitive ability adequate to safely complete daily activities?: Yes Is the patient deaf or have difficulty hearing?: Yes Does the patient have difficulty seeing, even when wearing glasses/contacts?:  No Does the patient have difficulty concentrating, remembering, or making decisions?: No Patient able to express need for assistance with ADLs?: Yes Does the patient have difficulty dressing or bathing?: No Independently performs ADLs?: Yes (appropriate for developmental age) Does the patient have difficulty walking or climbing stairs?: No Weakness of Legs: Both Weakness of Arms/Hands: None  Permission Sought/Granted Permission sought to share information with : Family Supports    Share Information with NAME: Tandy Gaw     Permission granted to share info w Relationship: spouse     Emotional Assessment Appearance:: Appears stated age Attitude/Demeanor/Rapport: Engaged Affect (typically observed): Accepting,Appropriate Orientation: : Oriented to Situation,Oriented to  Time,Oriented to Place,Oriented to Self Alcohol / Substance Use: Not Applicable Psych Involvement: No (comment)  Admission diagnosis:  Syncopal episodes [R55] NSTEMI (non-ST elevated myocardial infarction) ([place]) [I21.4] Syncope, unspecified syncope type [R55] Patient Active Problem List   Diagnosis Date Noted  . Hypomagnesemia 03/29/2020  . Hypophosphatemia 03/29/2020  . Hypertension 03/29/2020  . Hyponatremia 03/29/2020  . Urinary tract infection 03/29/2020  . NSTEMI (non-ST elevated myocardial infarction) ([place]) 03/28/2020  . Hypokalemia 03/28/2020  . Type 2 diabetes mellitus with hyperglycemia ([place]) 03/28/2020  . Leukocytosis 03/28/2020  . Class 2 obesity 03/28/2020  . Elevated TSH 02/25/2020  . Poor mobility 02/02/2020  . Microscopic hematuria   . Coronary artery disease 01/23/2020  . History of CVA (cerebrovascular accident) 01/23/2020  . Memory changes 01/01/2020  . Syncope 12/15/2019  .  Elevated troponin 12/14/2019  . Pain in both lower extremities 07/14/2019  . Peripheral vascular disease with stasis dermatitis 05/23/2019  . B12 deficiency 04/23/2019  . Constipation 04/21/2019  . Tremor 11/14/2018   . Morbid obesity ([place]) 09/07/2017  . COPD (chronic obstructive pulmonary disease) ([name]) 04/29/2017  . Intertrigo 02/02/2017  . BPH (benign prostatic hyperplasia) 07/23/2016  . Advanced care planning/counseling discussion 07/23/2016  . Eosinophilic esophagitis 97/04/6376  . PAD (peripheral artery disease) ([place]) 01/08/2016  . Gastroesophageal reflux disease without esophagitis 01/06/2016  . Hyperlipidemia associated with type 2 diabetes mellitus ([place]) 01/16/2015  . Sleep apnea 01/16/2015  . BMI 40.0-44.9, adult ([place]) 01/16/2015  . Hypertension associated with diabetes ([place]) 10/11/2014  . Type 2 diabetes mellitus with proteinuria ([place]) 10/11/2014  . Back pain 10/11/2014  . History of colon cancer    PCP:  Guadalupe Maple, MD Pharmacy:   [place], [place] - [address] [place] [address] Phone: [phone] Fax: [phone]     Social Determinants of Health (SDOH) Interventions    Readmission Risk Interventions No flowsheet data found.

## 2020-04-01 NOTE — Progress Notes (Signed)
PROGRESS NOTE    Justin Griffith  JIR:[id number] DOB: 1940/06/25 DOA: 03/28/2020 PCP: Guadalupe Maple, MD   Brief Narrative:  Patient is 80 year old male with past medical history significant of osteoarthritis, asthma, chronic back pain, colon polyp, COPD, emphysema of the lung, heart murmur, hernia, hyperlipidemia, hypertension, colon cancer, tobacco abuse, sleep apnea-on CPAP, type 2 diabetes mellitus with proteinuria presents to emergency department with syncopal episode at home while in the bathroom hitting his forehead, sustaining a hematoma in the area and one episode of emesis.  ED course: Afebrile, vital signs within normal limits, maintaining oxygen saturation 92% on room air, EKG shows ST-T wave changes on anterior lateral leads.  Troponin was 1890 and then 2723, BNP: 256, UA shows pyuria and bacteriuria.  CBC showed a white count of 17,000.  Hemoglobin 13.7, platelet: 198, PT/INR: WNL sodium 130, potassium 3.4, renal function within normal limit.  Chest x-ray shows pulmonary vascular congestion, peribronchial thickening and left basilar atelectasis/scarring again noted.  CT head did not show any acute findings.  Cardiology consulted and patient admitted for further evaluation and management.  Assessment & Plan:   NSTEMI: -Patient presented with syncopal episode with elevated troponin.  Reviewed EKG. -Patient started on heparin gtt in ED which was discontinued later due to frank hematuria. -Cardiology consulted-appreciate input-recommended against for urgent cardiac catheterization in the absence of chest pain and in the presence of gross hematuria on heparin drip.  Advised to follow-up with [name] in 1 week after the discharge. -Continue aspirin, statin, Coreg, olmesartan. -Echo shows ejection fraction of 60 to 65% with grade 1 diastolic dysfunction. -Patient remained chest pain-free.  Continue to monitor.    Syncope: -CT head negative for acute findings.  Echo as above.   Carotid Doppler ultrasound showed bilateral carotid bifurcation plaque resulting in less than 50% diameter ICA stenosis. -TSH, free T4: WNL from 12/7 -On fall precautions. -Consult PT/OT-recommend SNF-TOC consulted-await bed placement.  UTI/gross hematuria: -UA concerning for infection.  Patient started on Rocephin in ED.  Urine culture: No growth.  Leukocytosis: Resolved.  He remained afebrile. -Hematuria resolved.  H&H remained stable. -CT renal study: Negative for nephrolithiasis or hydronephrosis.  DC heparin GTT. -Urology consulted recommended to continue to hold anticoagulation, continue antibiotics for 10 days and follow urine culture result. -Outpatient cystoscopy appointment with Dr. Erlene Quan on 04/03/2020.  Appreciate urology's recommendations. -Continue Rocephin (Day #4) -DC Foley today.  AKI: Likely in the setting of dehydration and infection.  Resolved with IV fluids.  Hypertension: Blood pressure elevated this morning. -Continue Coreg, losartan.  Continue to hold alpha-blocker due to syncopal/dizziness. Continue to hold clonidine as per cardiology's recommendations -Added hydralazine as needed for blood pressure more than 160/100 -Continue to monitor blood pressure closely  Hyponatremia: Sodium level improved from 130-134-136.  Continue to monitor  Hypokalemia: Resolved  Hypomagnesemia: Resolved  BPH: Flomax is on hold due to syncope/dizziness  COPD: No wheezing noted on exam.  Continue supplemental oxygen as needed.  Continue bronchodilators as needed  Memory changes: Continue donepezil  Morbid obesity with BMI of 39: -Diet modification/exercise and weight loss recommended.  Type 2 diabetes mellitus: Well controlled.  A1c 6.9%. -We will hold Metformin.  Continue sliding scale insulin and Lantus 12 units daily.  Hypophosphatemia: Replenished.  Resolved.  Hyperlipidemia continue statin  Chronic diastolic CHF: -Reviewed echo.  Continue Lasix 20 mg daily.  Advised  leg elevation and compression stockings for bilateral leg swelling. -Strict INO's and daily weight.  Chronic back pain/arthritis: -We will  add oxycodone as needed for pain control.  Continue PT/OT.  DVT prophylaxis: SCD, no chemical anticoagulation due to frank hematuria Code Status: Full code Family Communication:  None present at bedside.  Plan of care discussed with patient in length and he verbalized understanding and agreed with it.  Disposition Plan: SNF  Consultants:   Cardiology  urology  Procedures:  CT renal study Echo Carotid doppler  Antimicrobials:   Rocephin  Dispo: The patient is from: Home              Anticipated d/c is to: SNF              Anticipated d/c date is: 2 days              Patient currently is not medically stable to d/c.   Subjective: Patient seen and examined.  Sitting comfortably on the recliner.  On room air.  Hard of hearing.  Tells me that he continues to have burning when he pees.  Complaining of bilateral leg swelling and chronic back pain requested for pain medications.  Denies chest pain, shortness of breath, orthopnea, PND, blood in urine, fever or chills or nausea or vomiting.  Objective: Vitals:   03/31/20 1846 03/31/20 1946 03/31/20 2310 04/01/20 0400  BP: (!) 162/78 (!) 187/70  (!) 183/87  Pulse: 76 63    Resp: 18 17    Temp: 98.4 F (36.9 C) 98.5 F (36.9 C)  98.2 F (36.8 C)  TempSrc: Oral Oral  Oral  SpO2: 93% 94% 95% 94%  Weight:      Height:        Intake/Output Summary (Last 24 hours) at 04/01/2020 1110 Last data filed at 04/01/2020 1026 Gross per 24 hour  Intake 120 ml  Output 2900 ml  Net -2780 ml   Filed Weights   03/28/20 1526 03/31/20 0400  Weight: 125.2 kg 123.2 kg    Examination:  General exam: Appears calm and comfortable,, on room air, communicating well, agrees  respiratory system: Decreased breath sounds noted on the bases.  No wheezing rhonchi or crackles. Cardiovascular system: S1 & S2  heard, RRR. No JVD, murmurs, rubs, gallops or clicks.  Bilateral 2+ pitting edema positive Gastrointestinal system: Abdomen is nondistended, soft and nontender. No organomegaly or masses felt. Normal bowel sounds heard.  Foley catheter with clear urine in the bag noted. Central nervous system: Alert and oriented. No focal neurological deficits. Extremities: Symmetric 5 x 5 power. Skin: No rashes, lesions or ulcers Psychiatry: Judgement and insight appear normal. Mood & affect appropriate.    Data Reviewed: I have personally reviewed following labs and imaging studies  CBC: Recent Labs  Lab 03/29/20 0346 03/29/20 1413 03/30/20 0316 03/31/20 0405 04/01/20 0218  WBC 15.1* 13.9* 10.6* 7.1 8.1  HGB 13.6 12.5* 12.6* 12.9* 13.6  HCT 39.0 36.4* 36.8* 37.6* 39.0  MCV 90.3 90.3 90.4 90.8 89.9  PLT 202 214 229 245 285   Basic Metabolic Panel: Recent Labs  Lab 03/28/20 1535 03/29/20 0346 03/30/20 0316 03/31/20 0405 04/01/20 0218  NA 130* 134* 134* 136 135  K 3.4* 3.6 3.3* 3.8 3.8  CL 93* 96* 96* 100 95*  CO2 25 27 27 27 29   GLUCOSE 214* 144* 135* 137* 173*  BUN 16 21 28* 26* 18  CREATININE 1.08 1.85* 1.35* 1.02 1.00  CALCIUM 9.5 9.4 9.1 9.4 9.8  MG 1.2*  --  1.6* 2.0  --   PHOS 2.3*  --  4.1  --   --  GFR: Estimated Creatinine Clearance: 77.6 mL/min (by C-G formula based on SCr of 1 mg/dL). Liver Function Tests: Recent Labs  Lab 03/30/20 0316 03/31/20 0405  AST 33 25  ALT 20 24  ALKPHOS 45 49  BILITOT 1.0 0.7  PROT 6.6 6.6  ALBUMIN 2.7* 2.8*   No results for input(s): LIPASE, AMYLASE in the last 168 hours. No results for input(s): AMMONIA in the last 168 hours. Coagulation Profile: Recent Labs  Lab 03/28/20 1818  INR 1.2   Cardiac Enzymes: No results for input(s): CKTOTAL, CKMB, CKMBINDEX, TROPONINI in the last 168 hours. BNP (last 3 results) No results for input(s): PROBNP in the last 8760 hours. HbA1C: No results for input(s): HGBA1C in the last 72  hours. CBG: Recent Labs  Lab 03/31/20 0845 03/31/20 1236 03/31/20 1824 03/31/20 2145 04/01/20 0834  GLUCAP 200* 235* 187* 282* 177*   Lipid Profile: No results for input(s): CHOL, HDL, LDLCALC, TRIG, CHOLHDL, LDLDIRECT in the last 72 hours. Thyroid Function Tests: No results for input(s): TSH, T4TOTAL, FREET4, T3FREE, THYROIDAB in the last 72 hours. Anemia Panel: No results for input(s): VITAMINB12, FOLATE, FERRITIN, TIBC, IRON, RETICCTPCT in the last 72 hours. Sepsis Labs: Recent Labs  Lab 03/28/20 1818  LATICACIDVEN 1.8    Recent Results (from the past 240 hour(s))  Resp Panel by RT-PCR (Flu A&B, Covid) Nasopharyngeal Swab     Status: None   Collection Time: 03/28/20  6:18 PM   Specimen: Nasopharyngeal Swab; Nasopharyngeal(NP) swabs in vial transport medium  Result Value Ref Range Status   SARS Coronavirus 2 by RT PCR NEGATIVE NEGATIVE Final    Comment: (NOTE) SARS-CoV-2 target nucleic acids are NOT DETECTED.  The SARS-CoV-2 RNA is generally detectable in upper respiratory specimens during the acute phase of infection. The lowest concentration of SARS-CoV-2 viral copies this assay can detect is 138 copies/mL. A negative result does not preclude SARS-Cov-2 infection and should not be used as the sole basis for treatment or other patient management decisions. A negative result may occur with  improper specimen collection/handling, submission of specimen other than nasopharyngeal swab, presence of viral mutation(s) within the areas targeted by this assay, and inadequate number of viral copies(<138 copies/mL). A negative result must be combined with clinical observations, patient history, and epidemiological information. The expected result is Negative.  Fact Sheet for Patients:  EntrepreneurPulse.com.au  Fact Sheet for Healthcare Providers:  IncredibleEmployment.be  This test is no t yet approved or cleared by the [place] FDA  and  has been authorized for detection and/or diagnosis of SARS-CoV-2 by FDA under an Emergency Use Authorization (EUA). This EUA will remain  in effect (meaning this test can be used) for the duration of the COVID-19 declaration under Section 564(b)(1) of the Act, 21 U.S.C.section 360bbb-3(b)(1), unless the authorization is terminated  or revoked sooner.       Influenza A by PCR NEGATIVE NEGATIVE Final   Influenza B by PCR NEGATIVE NEGATIVE Final    Comment: (NOTE) The Xpert Xpress SARS-CoV-2/FLU/RSV plus assay is intended as an aid in the diagnosis of influenza from Nasopharyngeal swab specimens and should not be used as a sole basis for treatment. Nasal washings and aspirates are unacceptable for Xpert Xpress SARS-CoV-2/FLU/RSV testing.  Fact Sheet for Patients: EntrepreneurPulse.com.au  Fact Sheet for Healthcare Providers: IncredibleEmployment.be  This test is not yet approved or cleared by the [place] FDA and has been authorized for detection and/or diagnosis of SARS-CoV-2 by FDA under an Emergency Use Authorization (EUA). This EUA  will remain in effect (meaning this test can be used) for the duration of the COVID-19 declaration under Section 564(b)(1) of the Act, 21 U.S.C. section 360bbb-3(b)(1), unless the authorization is terminated or revoked.  Performed at [facility], [address], [place], [place] [address]   Urine Culture     Status: None   Collection Time: 03/29/20  7:24 PM   Specimen: Urine, Random  Result Value Ref Range Status   Specimen Description   Final    URINE, RANDOM Performed at [facility], [address] [place], [place] [address]    Special Requests   Final    NONE Performed at [facility], [address] [place], [place] [address]    Culture   Final    NO GROWTH Performed at [facility], [place] [address] [place], [place] [address]    Report Status  03/31/2020 FINAL  Final      Radiology Studies: No results found.  Scheduled Meds: . aspirin EC  81 mg Oral Daily  . carvedilol  12.5 mg Oral BID WC  . cholecalciferol  2,000 Units Oral Daily  . donepezil  5 mg Oral QHS  . furosemide  20 mg Oral Daily  . insulin aspart  0-15 Units Subcutaneous TID WC  . insulin glargine  12 Units Subcutaneous Daily  . irbesartan  300 mg Oral Daily  . primidone  100 mg Oral BID  . rosuvastatin  40 mg Oral QHS  . vitamin B-12  1,000 mcg Oral Daily   Continuous Infusions: . cefTRIAXone (ROCEPHIN)  IV 2 g (04/01/20 0411)     LOS: 3 days   Time spent: 40 minutes   Tishie Altmann Loann Quill, MD [facility]  If 7PM-7AM, please contact night-coverage [email or web address] 04/01/2020, 11:10 AM

## 2020-04-02 DIAGNOSIS — I214 Non-ST elevation (NSTEMI) myocardial infarction: Secondary | ICD-10-CM | POA: Diagnosis not present

## 2020-04-02 LAB — GLUCOSE, CAPILLARY
Glucose-Capillary: 183 mg/dL — ABNORMAL HIGH (ref 70–99)
Glucose-Capillary: 247 mg/dL — ABNORMAL HIGH (ref 70–99)
Glucose-Capillary: 233 mg/dL — ABNORMAL HIGH (ref 70–99)
Glucose-Capillary: 210 mg/dL — ABNORMAL HIGH (ref 70–99)
Glucose-Capillary: 188 mg/dL — ABNORMAL HIGH (ref 70–99)
Glucose-Capillary: 216 mg/dL — ABNORMAL HIGH (ref 70–99)

## 2020-04-02 LAB — BASIC METABOLIC PANEL
Anion gap: 12 (ref 5–15)
BUN: 15 mg/dL (ref 8–23)
CO2: 24 mmol/L (ref 22–32)
Calcium: 10.4 mg/dL — ABNORMAL HIGH (ref 8.9–10.3)
Chloride: 96 mmol/L — ABNORMAL LOW (ref 98–111)
Creatinine, Ser: 0.92 mg/dL (ref 0.61–1.24)
GFR, Estimated: >60 mL/min (ref >60)
Glucose, Bld: 187 mg/dL — ABNORMAL HIGH (ref 70–99)
Potassium: 4.4 mmol/L (ref 3.5–5.1)
Sodium: 132 mmol/L — ABNORMAL LOW (ref 135–145)

## 2020-04-02 LAB — CBC
HCT: 43.7 % (ref 39.0–52.0)
Hemoglobin: 14.9 g/dL (ref 13.0–17.0)
MCH: 30.7 pg (ref 26.0–34.0)
MCHC: 34.1 g/dL (ref 30.0–36.0)
MCV: 89.9 fL (ref 80.0–100.0)
Platelets: 305 10*3/uL (ref 150–400)
RBC: 4.86 MIL/uL (ref 4.22–5.81)
RDW: 13 % (ref 11.5–15.5)
WBC: 9.1 10*3/uL (ref 4.0–10.5)
nRBC: 0 % (ref 0.0–0.2)

## 2020-04-02 MED ORDER — AMLODIPINE BESYLATE 10 MG PO TABS
10.0000 mg | ORAL_TABLET | Freq: Every day | ORAL | Status: DC
  Administered 2020-04-02 – 2020-04-03 (×2): 10 mg via ORAL
  Filled 2020-04-02 (×2): qty 2

## 2020-04-02 MED ORDER — INSULIN ASPART 100 UNIT/ML ~~LOC~~ SOLN
3.0000 [IU] | Freq: Three times a day (TID) | SUBCUTANEOUS | Status: DC
  Administered 2020-04-02 – 2020-04-03 (×3): 3 [IU] via SUBCUTANEOUS
  Filled 2020-04-02 (×3): qty 1

## 2020-04-02 MED ORDER — CEPHALEXIN 500 MG PO CAPS
500.0000 mg | ORAL_CAPSULE | Freq: Three times a day (TID) | ORAL | Status: DC
  Administered 2020-04-03 (×2): 500 mg via ORAL
  Filled 2020-04-02 (×2): qty 1

## 2020-04-02 MED ORDER — CARVEDILOL 25 MG PO TABS
25.0000 mg | ORAL_TABLET | Freq: Two times a day (BID) | ORAL | Status: DC
  Administered 2020-04-02 – 2020-04-03 (×3): 25 mg via ORAL
  Filled 2020-04-02 (×3): qty 1

## 2020-04-02 NOTE — Progress Notes (Signed)
[facility] Cardiology    SUBJECTIVE: The patient reports feeling sluggish and constipated. He reports chronic back pain. He denies chest pain or shortness of breath. He reports nearly falling when he walked back from the bathroom. He denies lightheadedness, dizziness, weakness, or presyncope. He denies hematuria, but does report dysuria.   Vitals:   04/02/20 0400 04/02/20 0500 04/02/20 0629 04/02/20 0737  BP:   (!) 180/93 (!) 192/101  Pulse:   71 79  Resp: 19 18  20   Temp:    98.1 F (36.7 C)  TempSrc:    Oral  SpO2:    95%  Weight:      Height:         Intake/Output Summary (Last 24 hours) at 04/02/2020 0831 Last data filed at 04/02/2020 0700 Gross per 24 hour  Intake 460 ml  Output 2550 ml  Net -2090 ml      LABS: Basic Metabolic Panel: Recent Labs    03/31/20 0405 04/01/20 0218  NA 136 135  K 3.8 3.8  CL 100 95*  CO2 27 29  GLUCOSE 137* 173*  BUN 26* 18  CREATININE 1.02 1.00  CALCIUM 9.4 9.8  MG 2.0  --    Liver Function Tests: Recent Labs    03/31/20 0405  AST 25  ALT 24  ALKPHOS 49  BILITOT 0.7  PROT 6.6  ALBUMIN 2.8*   No results for input(s): LIPASE, AMYLASE in the last 72 hours. CBC: Recent Labs    03/31/20 0405 04/01/20 0218  WBC 7.1 8.1  HGB 12.9* 13.6  HCT 37.6* 39.0  MCV 90.8 89.9  PLT 245 285   Cardiac Enzymes: No results for input(s): CKTOTAL, CKMB, CKMBINDEX, TROPONINI in the last 72 hours. BNP: Invalid input(s): POCBNP D-Dimer: No results for input(s): DDIMER in the last 72 hours. Hemoglobin A1C: No results for input(s): HGBA1C in the last 72 hours. Fasting Lipid Panel: No results for input(s): CHOL, HDL, LDLCALC, TRIG, CHOLHDL, LDLDIRECT in the last 72 hours. Thyroid Function Tests: No results for input(s): TSH, T4TOTAL, T3FREE, THYROIDAB in the last 72 hours.  Invalid input(s): FREET3 Anemia Panel: No results for input(s): VITAMINB12, FOLATE, FERRITIN, TIBC, IRON, RETICCTPCT in the last 72 hours.  No results  found.   Echo LVEF 60-65%, mild mitral regurgitation   ASSESSMENT AND PLAN:  Principal Problem:   NSTEMI (non-ST elevated myocardial infarction) ([place]) Active Problems:   Sleep apnea   BPH (benign prostatic hyperplasia)   COPD (chronic obstructive pulmonary disease) (H[facility])   Syncope   Memory changes   Coronary artery disease   Hypokalemia   Type 2 diabetes mellitus with hyperglycemia ([facility])   Leukocytosis   Class 2 obesity   Hypomagnesemia   Hypophosphatemia   Hypertension   Hyponatremia   Urinary tract infection    1. Non-ST elevation myocardial infarction, with elevated high-sensitivity troponin (1890, 2723, 4791),with abnormal ECG, in the absence of chest pain. Heparin discontinued due to gross hematuria.The patient continues to deny recurrent chest pain or shortness of breath. 2.Mild aortic stenosis of uncertain clinical significance 3.Gross hematuria, exacerbated by heparin drip, with probable UTI/urosepsis.Patient seen by urology, who recommends holding anticoagulation, with outpatient cystoscopy scheduled. 4.Syncope, of uncertain etiology, currently in sinus rhythm, no significant arrhythmia noted on telemetry 5.COPD, oxygenating well on room air 6.  Essential hypertension, blood pressure elevated on current BP medications   Recommendations: 1. Continue aspirin, Crestor, carvedilol and irbesartan 2. Hold clonidine due to syncope 3. Continue Lasix 20 mg daily 4. Recommend manual  blood pressure check for comparison 5. Defer further cardiac diagnostics at this time, and follow-up with [name] in 1 week from discharge.  Sign off for now; please call with any questions.  Clabe Seal, PA-C 04/02/2020 8:31 AM

## 2020-04-02 NOTE — Progress Notes (Signed)
PROGRESS NOTE    Justin Griffith  OVF:[id number] DOB: 1940-07-19 DOA: 03/28/2020 PCP: Guadalupe Maple, MD   Brief Narrative:  Patient is 80 year old male with past medical history significant of osteoarthritis, asthma, chronic back pain, colon polyp, COPD, emphysema of the lung, heart murmur, hernia, hyperlipidemia, hypertension, colon cancer, tobacco abuse, sleep apnea-on CPAP, type 2 diabetes mellitus with proteinuria presents to emergency department with syncopal episode at home while in the bathroom hitting his forehead, sustaining a hematoma in the area and one episode of emesis.  ED course: Afebrile, vital signs within normal limits, maintaining oxygen saturation 92% on room air, EKG shows ST-T wave changes on anterior lateral leads.  Troponin was 1890 and then 2723, BNP: 256, UA shows pyuria and bacteriuria.  CBC showed a white count of 17,000.  Hemoglobin 13.7, platelet: 198, PT/INR: WNL sodium 130, potassium 3.4, renal function within normal limit.  Chest x-ray shows pulmonary vascular congestion, peribronchial thickening and left basilar atelectasis/scarring again noted.  CT head did not show any acute findings.  Cardiology consulted and patient admitted for further evaluation and management.  Assessment & Plan:   NSTEMI: -Patient presented with syncopal episode with elevated troponin.  Reviewed EKG. -Patient started on heparin gtt in ED which was discontinued later due to frank hematuria. -Cardiology consulted-appreciate input-recommended against for urgent cardiac catheterization in the absence of chest pain and in the presence of gross hematuria on heparin drip.  Advised to follow-up with [name] in 1 week after the discharge. -Continue aspirin, statin, Coreg, olmesartan. -Echo shows ejection fraction of 60 to 65% with grade 1 diastolic dysfunction. -Patient remained chest pain-free.  Continue to monitor.    Syncope: -CT head negative for acute findings.  Echo as above.   Carotid Doppler ultrasound showed bilateral carotid bifurcation plaque resulting in less than 50% diameter ICA stenosis. -TSH, free T4: WNL from 12/7 -On fall precautions. -Consult PT/OT-recommend SNF-TOC consulted-appreciate help  UTI/gross hematuria: -UA concerning for infection.  Patient started on Rocephin in ED.  Urine culture: No growth.  Leukocytosis: Resolved.  He remained afebrile. -Hematuria resolved.  H&H remained stable. -CT renal study: Negative for nephrolithiasis or hydronephrosis.  DC heparin GTT. -Urology consulted recommended to continue to hold anticoagulation, continue antibiotics for 10 days and follow urine culture result. -Outpatient cystoscopy appointment with Dr. Erlene Quan on 04/03/2020.  Appreciate urology's recommendations. -Changed to IV Rocephin to  Keflex 500 mg 3 times daily x 5 days -DC Foley on 1/10.  Hypertension: Blood pressure elevated this morning 190s/100s. -On Coreg, losartan.  Continue to hold alpha-blocker and clonidine due to syncopal/dizziness.  -Increase Coreg dose from 12.5 mg twice daily to 25 mg twice daily and added amlodipine 10 mg daily. -Continue hydralazine as needed for blood pressure more than 160/100 -Continue to monitor blood pressure closely  AKI: Likely in the setting of dehydration and infection.  Resolved with IV fluids.  Hyponatremia: Sodium level improved to 136 then dropped to 132. -Repeat BMP tomorrow a.m.  Hypokalemia: Resolved  Hypomagnesemia: Resolved  BPH: Flomax is on hold due to syncope/dizziness  COPD: No wheezing noted on exam.  Continue supplemental oxygen as needed.  Continue bronchodilators as needed  Memory changes: Continue donepezil  Morbid obesity with BMI of 39: -Diet modification/exercise and weight loss recommended.  Type 2 diabetes mellitus: Well controlled.  A1c 6.9%. -We will hold Metformin.  Continue sliding scale insulin and Lantus 12 units daily.  Added NovoLog 3 units 3 times daily with meals  as per diabetes  coordinator's recommendations.  Hypophosphatemia: Replenished.  Resolved.  Hyperlipidemia continue statin  Chronic diastolic CHF: -Reviewed echo.  Continue Lasix 20 mg daily.  Advised leg elevation and compression stockings for bilateral leg swelling. -Strict INO's and daily weight.  Chronic back pain/arthritis: -We will add oxycodone as needed for pain control.  Continue PT/OT-recommend SNF.  Patient is improving overall.  His hematuria and AKI has resolved.  Cardiology signed off.  His blood pressure is on higher side.  Increase Coreg dose and added amlodipine as above.  If remains stable tomorrow can be discharged to SNF tomorrow.  DVT prophylaxis: SCD, no chemical anticoagulation due to frank hematuria Code Status: Full code Family Communication:  None present at bedside.  Plan of care discussed with patient in length and he verbalized understanding and agreed with it.  I called patient's daughter Justin Griffith and discussed plan of care and she verbalized understanding.  Disposition Plan: SNF  Consultants:   Cardiology  urology  Procedures:  CT renal study Echo Carotid doppler  Antimicrobials:   Rocephin  Dispo: The patient is from: Home              Anticipated d/c is to: SNF              Anticipated d/c date is: 1 day              Patient currently is not medically stable to d/c.   Subjective: Patient seen and examined.  Sitting comfortably on recliner.  Complaining of burning when he pees again chronic back pain and knee pain.  Remained afebrile.  No acute events overnight.  Objective: Vitals:   04/02/20 0737 04/02/20 0945 04/02/20 1104 04/02/20 1127  BP: (!) 192/101 (!) 182/89 (!) 174/88 (!) 166/81  Pulse: 79 (!) 104 67   Resp: 20  18   Temp: 98.1 F (36.7 C)  97.8 F (36.6 C)   TempSrc: Oral     SpO2: 95%  94%   Weight:      Height:        Intake/Output Summary (Last 24 hours) at 04/02/2020 1437 Last data filed at 04/02/2020 1104 Gross  per 24 hour  Intake 580 ml  Output 1250 ml  Net -670 ml   Filed Weights   03/28/20 1526 03/31/20 0400 04/02/20 0357  Weight: 125.2 kg 123.2 kg 123.2 kg    Examination:  General exam: Appears calm and comfortable,, on room air, communicating well, hard of hearing. respiratory system: Clear to auscultate.  No wheezing or rhonchi.  Cardiovascular system: S1 & S2 heard, RRR. No JVD, murmurs, rubs, gallops or clicks.  No pedal edema. Gastrointestinal system: Abdomen is nondistended, soft and nontender. No organomegaly or masses felt. Normal bowel sounds heard.  Foley catheter with clear urine in the bag noted. Central nervous system: Alert and oriented. No focal neurological deficits. Extremities: Symmetric 5 x 5 power. Skin: No rashes, lesions or ulcers Psychiatry: Judgement and insight appear normal. Mood & affect appropriate.    Data Reviewed: I have personally reviewed following labs and imaging studies  CBC: Recent Labs  Lab 03/29/20 1413 03/30/20 0316 03/31/20 0405 04/01/20 0218 04/02/20 0814  WBC 13.9* 10.6* 7.1 8.1 9.1  HGB 12.5* 12.6* 12.9* 13.6 14.9  HCT 36.4* 36.8* 37.6* 39.0 43.7  MCV 90.3 90.4 90.8 89.9 89.9  PLT 214 229 245 285 [id number]   Basic Metabolic Panel: Recent Labs  Lab 03/28/20 1535 03/29/20 0346 03/30/20 0316 03/31/20 0405 04/01/20 0218 04/02/20 0814  NA 130*  134* 134* 136 135 132*  K 3.4* 3.6 3.3* 3.8 3.8 4.4  CL 93* 96* 96* 100 95* 96*  CO2 25 27 27 27 29 24   GLUCOSE 214* 144* 135* 137* 173* 187*  BUN 16 21 28* 26* 18 15  CREATININE 1.08 1.85* 1.35* 1.02 1.00 0.92  CALCIUM 9.5 9.4 9.1 9.4 9.8 10.4*  MG 1.2*  --  1.6* 2.0  --   --   PHOS 2.3*  --  4.1  --   --   --    GFR: Estimated Creatinine Clearance: 84.3 mL/min (by C-G formula based on SCr of 0.92 mg/dL). Liver Function Tests: Recent Labs  Lab 03/30/20 0316 03/31/20 0405  AST 33 25  ALT 20 24  ALKPHOS 45 49  BILITOT 1.0 0.7  PROT 6.6 6.6  ALBUMIN 2.7* 2.8*   No results for  input(s): LIPASE, AMYLASE in the last 168 hours. No results for input(s): AMMONIA in the last 168 hours. Coagulation Profile: Recent Labs  Lab 03/28/20 1818  INR 1.2   Cardiac Enzymes: No results for input(s): CKTOTAL, CKMB, CKMBINDEX, TROPONINI in the last 168 hours. BNP (last 3 results) No results for input(s): PROBNP in the last 8760 hours. HbA1C: No results for input(s): HGBA1C in the last 72 hours. CBG: Recent Labs  Lab 04/01/20 2055 04/02/20 0733 04/02/20 0947 04/02/20 1105 04/02/20 1243  GLUCAP 178* 183* 247* 233* 210*   Lipid Profile: No results for input(s): CHOL, HDL, LDLCALC, TRIG, CHOLHDL, LDLDIRECT in the last 72 hours. Thyroid Function Tests: No results for input(s): TSH, T4TOTAL, FREET4, T3FREE, THYROIDAB in the last 72 hours. Anemia Panel: No results for input(s): VITAMINB12, FOLATE, FERRITIN, TIBC, IRON, RETICCTPCT in the last 72 hours. Sepsis Labs: Recent Labs  Lab 03/28/20 1818  LATICACIDVEN 1.8    Recent Results (from the past 240 hour(s))  Resp Panel by RT-PCR (Flu A&B, Covid) Nasopharyngeal Swab     Status: None   Collection Time: 03/28/20  6:18 PM   Specimen: Nasopharyngeal Swab; Nasopharyngeal(NP) swabs in vial transport medium  Result Value Ref Range Status   SARS Coronavirus 2 by RT PCR NEGATIVE NEGATIVE Final    Comment: (NOTE) SARS-CoV-2 target nucleic acids are NOT DETECTED.  The SARS-CoV-2 RNA is generally detectable in upper respiratory specimens during the acute phase of infection. The lowest concentration of SARS-CoV-2 viral copies this assay can detect is 138 copies/mL. A negative result does not preclude SARS-Cov-2 infection and should not be used as the sole basis for treatment or other patient management decisions. A negative result may occur with  improper specimen collection/handling, submission of specimen other than nasopharyngeal swab, presence of viral mutation(s) within the areas targeted by this assay, and inadequate  number of viral copies(<138 copies/mL). A negative result must be combined with clinical observations, patient history, and epidemiological information. The expected result is Negative.  Fact Sheet for Patients:  EntrepreneurPulse.com.au  Fact Sheet for Healthcare Providers:  IncredibleEmployment.be  This test is no t yet approved or cleared by the [place] FDA and  has been authorized for detection and/or diagnosis of SARS-CoV-2 by FDA under an Emergency Use Authorization (EUA). This EUA will remain  in effect (meaning this test can be used) for the duration of the COVID-19 declaration under Section 564(b)(1) of the Act, 21 U.S.C.section 360bbb-3(b)(1), unless the authorization is terminated  or revoked sooner.       Influenza A by PCR NEGATIVE NEGATIVE Final   Influenza B by PCR NEGATIVE NEGATIVE Final  Comment: (NOTE) The Xpert Xpress SARS-CoV-2/FLU/RSV plus assay is intended as an aid in the diagnosis of influenza from Nasopharyngeal swab specimens and should not be used as a sole basis for treatment. Nasal washings and aspirates are unacceptable for Xpert Xpress SARS-CoV-2/FLU/RSV testing.  Fact Sheet for Patients: EntrepreneurPulse.com.au  Fact Sheet for Healthcare Providers: IncredibleEmployment.be  This test is not yet approved or cleared by the [place] FDA and has been authorized for detection and/or diagnosis of SARS-CoV-2 by FDA under an Emergency Use Authorization (EUA). This EUA will remain in effect (meaning this test can be used) for the duration of the COVID-19 declaration under Section 564(b)(1) of the Act, 21 U.S.C. section 360bbb-3(b)(1), unless the authorization is terminated or revoked.  Performed at [facility], [address], [place], [place] [address]   Urine Culture     Status: None   Collection Time: 03/29/20  7:24 PM   Specimen: Urine, Random   Result Value Ref Range Status   Specimen Description   Final    URINE, RANDOM Performed at [facility], [address] [place], [place] [address]    Special Requests   Final    NONE Performed at [facility], [address], [place], [place] [address]    Culture   Final    NO GROWTH Performed at [facility], [place] [address], [place], [place] [address]    Report Status 03/31/2020 FINAL  Final  Resp Panel by RT-PCR (Flu A&B, Covid) Nasopharyngeal Swab     Status: None   Collection Time: 04/01/20  1:42 PM   Specimen: Nasopharyngeal Swab; Nasopharyngeal(NP) swabs in vial transport medium  Result Value Ref Range Status   SARS Coronavirus 2 by RT PCR NEGATIVE NEGATIVE Final    Comment: (NOTE) SARS-CoV-2 target nucleic acids are NOT DETECTED.  The SARS-CoV-2 RNA is generally detectable in upper respiratory specimens during the acute phase of infection. The lowest concentration of SARS-CoV-2 viral copies this assay can detect is 138 copies/mL. A negative result does not preclude SARS-Cov-2 infection and should not be used as the sole basis for treatment or other patient management decisions. A negative result may occur with  improper specimen collection/handling, submission of specimen other than nasopharyngeal swab, presence of viral mutation(s) within the areas targeted by this assay, and inadequate number of viral copies(<138 copies/mL). A negative result must be combined with clinical observations, patient history, and epidemiological information. The expected result is Negative.  Fact Sheet for Patients:  EntrepreneurPulse.com.au  Fact Sheet for Healthcare Providers:  IncredibleEmployment.be  This test is no t yet approved or cleared by the [place] FDA and  has been authorized for detection and/or diagnosis of SARS-CoV-2 by FDA under an Emergency Use Authorization (EUA). This EUA will remain  in effect  (meaning this test can be used) for the duration of the COVID-19 declaration under Section 564(b)(1) of the Act, 21 U.S.C.section 360bbb-3(b)(1), unless the authorization is terminated  or revoked sooner.       Influenza A by PCR NEGATIVE NEGATIVE Final   Influenza B by PCR NEGATIVE NEGATIVE Final    Comment: (NOTE) The Xpert Xpress SARS-CoV-2/FLU/RSV plus assay is intended as an aid in the diagnosis of influenza from Nasopharyngeal swab specimens and should not be used as a sole basis for treatment. Nasal washings and aspirates are unacceptable for Xpert Xpress SARS-CoV-2/FLU/RSV testing.  Fact Sheet for Patients: EntrepreneurPulse.com.au  Fact Sheet for Healthcare Providers: IncredibleEmployment.be  This test is not yet approved  or cleared by the [place] and has been authorized for detection and/or diagnosis of SARS-CoV-2 by FDA under an Emergency Use Authorization (EUA). This EUA will remain in effect (meaning this test can be used) for the duration of the COVID-19 declaration under Section 564(b)(1) of the Act, 21 U.S.C. section 360bbb-3(b)(1), unless the authorization is terminated or revoked.  Performed at [facility], [address] [place], [place] [address]       Radiology Studies: No results found.  Scheduled Meds: . amLODipine  10 mg Oral Daily  . aspirin EC  81 mg Oral Daily  . carvedilol  25 mg Oral BID WC  . [START ON 04/03/2020] cephALEXin  500 mg Oral Q8H  . cholecalciferol  2,000 Units Oral Daily  . donepezil  5 mg Oral QHS  . furosemide  20 mg Oral Daily  . insulin aspart  0-15 Units Subcutaneous TID WC  . insulin glargine  12 Units Subcutaneous Daily  . irbesartan  300 mg Oral Daily  . primidone  100 mg Oral BID  . rosuvastatin  40 mg Oral QHS  . vitamin B-12  1,000 mcg Oral Daily   Continuous Infusions:    LOS: 4 days   Time spent: 40 minutes   Takota Cahalan Loann Quill, MD [facility]  If 7PM-7AM, please contact night-coverage [email or web address] 04/02/2020, 2:37 PM

## 2020-04-02 NOTE — Progress Notes (Signed)
Mobility Specialist - Progress Note   04/02/20 1554  Mobility  Activity  (setaed exercises)  Level of Assistance Standby assist, set-up cues, supervision of patient - no hands on  Mobility Response Tolerated well  Mobility performed by Mobility specialist  $Mobility charge 1 Mobility    Pre-mobility: 69 HR, 92% SpO2 Post-mobility: 72 HR, 92% SpO2   Pt sitting on recliner upon arrival. Pt agreed to session. Pt performed seated exercises: ankle pumps x 10, kicks x 10, and marches x 10. No c/o pain or SOB. Overall, pt tolerated session well. Pt remained sitting on recliner w/ all needs placed in reach. Nurse was notified.    Gilverto Dileonardo Mobility Specialist  04/02/20, 3:57 PM

## 2020-04-02 NOTE — Progress Notes (Signed)
Occupational Therapy Treatment Patient Details Name: Justin Griffith MRN: [id number] DOB: 03-18-1941 Today's Date: 04/02/2020    History of present illness Patient is 80 year old male with past medical history significant of osteoarthritis, asthma, chronic back pain, colon polyp, COPD, emphysema of the lung, heart murmur, hernia, hyperlipidemia, hypertension, colon cancer, tobacco abuse, sleep apnea-on CPAP, type 2 diabetes mellitus with proteinuria presents to emergency department with syncopal episode at home while in the bathroom hitting his forehead, sustaining a hematoma in the area and one episode of emesis. Found to have NSTEMI, UTI, AKI   OT comments  Upon arrival pt found seated on toilet (pt reports getting up w/o supervision) and pt rises prior to OT entering bathroom - OT attempted to return pt to sitting to allow for RW use however pt initiated return to bed using furniture/wall walking. MIN A for safety 2/2 poor balance and poor safety awareness. MOD A for LBD and SUPERVISION for rolling at bed level. Extensive education on falls prevention, importance of RW for safety, and d/c recs given. Pt states he does not think he needs to use walker. Pt left in bed with bed alarm on and call bell in reach. Pt making progress toward goals. Pt continues to benefit from skilled OT services to maximize return to PLOF and minimize risk of future falls, injury, caregiver burden, and readmission. Will continue to follow POC. Discharge recommendation remains appropriate.    Follow Up Recommendations  SNF    Equipment Recommendations  3 in 1 bedside commode    Recommendations for Other Services      Precautions / Restrictions Precautions Precautions: Fall Restrictions Weight Bearing Restrictions: No       Mobility Bed Mobility Overal bed mobility: Needs Assistance Bed Mobility: Rolling;Sit to Supine Rolling: Supervision     Sit to supine: Min assist   General bed mobility comments:  assistance for trunk control  Transfers Overall transfer level: Needs assistance Equipment used: 1 person hand held assist Transfers: Sit to/from Stand   Sit to Stand transfers: Min assist            Balance Overall balance assessment: Needs assistance Sitting-balance support: Feet supported;Single extremity supported Sitting balance-Leahy Scale: Fair     Standing balance support: Bilateral upper extremity supported Standing balance-Leahy Scale: Poor Standing balance comment: requires MIN A and BUE support                           ADL either performed or assessed with clinical judgement   ADL Overall ADL's : Needs assistance/impaired                                       General ADL Comments: MIN A + furniture walking toilet>bed. MOD A for LBD at bed level.               Cognition Arousal/Alertness: Awake/alert Behavior During Therapy: WFL for tasks assessed/performed Overall Cognitive Status: Within Functional Limits for tasks assessed                                          Exercises Exercises: Other exercises Other Exercises Other Exercises: Pt educated re: DME recs, d/c recs, falls prevention, ECS, HEP, importance of supervision and RW for mobility Other Exercises:  LBD, sit>sup, sit<>stand, sitting/standing balance/tolerance, toileting      General Comments SpO2 94% on RA, HR 114 following mobility    Pertinent Vitals/ Pain       Pain Assessment: No/denies pain         Frequency  Min 2X/week        Progress Toward Goals  OT Goals(current goals can now be found in the care plan section)  Progress towards OT goals: Progressing toward goals  Acute Rehab OT Goals Patient Stated Goal: to feel better OT Goal Formulation: With patient Time For Goal Achievement: 04/13/20 Potential to Achieve Goals: Good ADL Goals Pt Will Perform Grooming: with modified independence;standing (c LRAD PRN) Pt Will  Transfer to Toilet: with modified independence;ambulating;regular height toilet (c LRAD PRN) Additional ADL Goal #1: Pt will Independently verbalize plan to implement x3 falls prevention strategies  Plan Discharge plan remains appropriate;Frequency remains appropriate       AM-PAC OT "6 Clicks" Daily Activity     Outcome Measure   Help from another person eating meals?: None Help from another person taking care of personal grooming?: A Little Help from another person toileting, which includes using toliet, bedpan, or urinal?: A Lot Help from another person bathing (including washing, rinsing, drying)?: A Little Help from another person to put on and taking off regular upper body clothing?: A Little Help from another person to put on and taking off regular lower body clothing?: A Little 6 Click Score: 18    End of Session    OT Visit Diagnosis: Other abnormalities of gait and mobility (R26.89);History of falling (Z91.81)   Activity Tolerance Patient limited by fatigue   Patient Left in bed;with call bell/phone within reach;with bed alarm set   Nurse Communication          Time: 2505-3976 OT Time Calculation (min): 10 min  Charges: OT General Charges $OT Visit: 1 Visit OT Treatments $Self Care/Home Management : 8-22 mins  Dessie Coma, M.S. OTR/L  04/02/20, 10:45 AM  ascom [phone]

## 2020-04-02 NOTE — Progress Notes (Signed)
Physical Therapy Treatment Patient Details Name: Justin Griffith MRN: [id number] DOB: 01-14-1941 Today's Date: 04/02/2020    History of Present Illness Patient is 80 year old male with past medical history significant of osteoarthritis, asthma, chronic back pain, colon polyp, COPD, emphysema of the lung, heart murmur, hernia, hyperlipidemia, hypertension, colon cancer, tobacco abuse, sleep apnea-on CPAP, type 2 diabetes mellitus with proteinuria presents to emergency department with syncopal episode at home while in the bathroom hitting his forehead, sustaining a hematoma in the area and one episode of emesis. Found to have NSTEMI, UTI, AKI    PT Comments    Patient received in recliner, reports he is doing okay. States therapist just left his room (mobility specialist). Just did exercises, but he agrees to walk. Patient requires min guard for sit to stand with cues. Min assist for ambulation of 125 feet with RW. Slight unsteadiness with mobility, but mainly limited by fatigue and sob. Patient will continue to benefit from skilled PT While here to improve activity tolerance and safety with mobility.     Follow Up Recommendations  SNF     Equipment Recommendations  Other (comment) (TBD)    Recommendations for Other Services       Precautions / Restrictions Precautions Precautions: Fall Restrictions Weight Bearing Restrictions: No    Mobility  Bed Mobility               General bed mobility comments: patient received in recliner and stayed up in recliner at end of session  Transfers Overall transfer level: Needs assistance Equipment used: Rolling walker (2 wheeled) Transfers: Sit to/from Stand Sit to Stand: Min guard         General transfer comment: min guard with cues to push up from recliner  Ambulation/Gait Ambulation/Gait assistance: Min guard Gait Distance (Feet): 125 Feet Assistive device: Rolling walker (2 wheeled) Gait Pattern/deviations: Step-through  pattern;Decreased step length - right;Decreased step length - left;Wide base of support;Shuffle Gait velocity: decr   General Gait Details: Patient ambulated with RW and occasional increased lateral sway. Fatigued and mild sob with gait. O2 sats remained in mid 90%s on room air.   Stairs             Wheelchair Mobility    Modified Rankin (Stroke Patients Only)       Balance Overall balance assessment: Needs assistance Sitting-balance support: Feet supported Sitting balance-Leahy Scale: Good     Standing balance support: Bilateral upper extremity supported;During functional activity Standing balance-Leahy Scale: Fair Standing balance comment: requires MIN guard and BUE support                            Cognition Arousal/Alertness: Awake/alert Behavior During Therapy: Flat affect Overall Cognitive Status: Within Functional Limits for tasks assessed                                        Exercises      General Comments        Pertinent Vitals/Pain Pain Assessment: Faces Faces Pain Scale: Hurts a little bit Pain Location: pain with urination Pain Descriptors / Indicators: Burning    Home Living                      Prior Function            PT Goals (current goals can  now be found in the care plan section) Acute Rehab PT Goals Patient Stated Goal: to feel better PT Goal Formulation: With patient Time For Goal Achievement: 04/14/20 Potential to Achieve Goals: Good Progress towards PT goals: Progressing toward goals    Frequency    Min 2X/week      PT Plan Current plan remains appropriate    Co-evaluation              AM-PAC PT "6 Clicks" Mobility   Outcome Measure  Help needed turning from your back to your side while in a flat bed without using bedrails?: A Little Help needed moving from lying on your back to sitting on the side of a flat bed without using bedrails?: A Little Help needed moving  to and from a bed to a chair (including a wheelchair)?: A Little Help needed standing up from a chair using your arms (e.g., wheelchair or bedside chair)?: A Little Help needed to walk in hospital room?: A Little Help needed climbing 3-5 steps with a railing? : A Lot 6 Click Score: 17    End of Session Equipment Utilized During Treatment: Gait belt Activity Tolerance: Patient limited by fatigue;Other (comment) (sob) Patient left: in chair;with call bell/phone within reach;with chair alarm set Nurse Communication: Mobility status;Other (comment) (patient want NT to check external catheter to see if leaking, NT called.) PT Visit Diagnosis: History of falling (Z91.81);Muscle weakness (generalized) (M62.81);Difficulty in walking, not elsewhere classified (R26.2);Unsteadiness on feet (R26.81)     Time: 4709-6283 PT Time Calculation (min) (ACUTE ONLY): 15 min  Charges:  $Gait Training: 8-22 mins                     [employer], PT, GCS 04/02/20,3:33 PM

## 2020-04-02 NOTE — TOC Progression Note (Signed)
Transition of Care [facility]) - Progression Note    Patient Details  Name: Justin Griffith MRN: [id number] Date of Birth: 1940-04-26  [facility]) CM/SW Contact  Eileen Stanford, LCSW Phone Number: 04/02/2020, 2:17 PM  Clinical Narrative:   Pt with elevated BP. Per MD will plan for dc tomorrow. SNF aware.    Expected Discharge Plan: [place] Barriers to Discharge: Continued Medical Work up  Expected Discharge Plan and Services Expected Discharge Plan: [place] In-house Referral: Clinical Social Work   Post Acute Care Choice: [place] Living arrangements for the past 2 months: Single Family Home                                       Social Determinants of Health (SDOH) Interventions    Readmission Risk Interventions No flowsheet data found.

## 2020-04-02 NOTE — Progress Notes (Signed)
Inpatient Diabetes Program Recommendations  AACE/ADA: New Consensus Statement on Inpatient Glycemic Control   Target Ranges:  Prepandial:   less than 140 mg/dL      [facility]ak postprandial:   less than 180 mg/dL (1-2 hours)      Critically ill patients:  140 - 180 mg/dL   Results for [name], [name] (MRN [id number]) as of 04/02/2020 11:41  Ref. Range 04/01/2020 08:34 04/01/2020 12:23 04/01/2020 16:28 04/01/2020 20:55 04/02/2020 07:33 04/02/2020 09:47 04/02/2020 11:05  Glucose-Capillary Latest Ref Range: 70 - 99 mg/dL 177 (H) 185 (H) 196 (H) 178 (H) 183 (H) 247 (H) 233 (H)   Review of Glycemic Control  Diabetes history: DM2 Outpatient Diabetes medications: Lantus 25 units daily, Metformin 1000 mg BID Current orders for Inpatient glycemic control: Lantus 12 units daily, Novolog 0-15 units TID with meals  Inpatient Diabetes Program Recommendations:    Insulin: Please consider ordering Novolog 3 units TID with meals for meal coverage if patient eats at least 50% of meals.  Thanks, Barnie Alderman, RN, MSN, CDE Diabetes Coordinator Inpatient Diabetes Program [phone] (Team Pager from 8am to 5pm)

## 2020-04-03 ENCOUNTER — Other Ambulatory Visit: Payer: Medicare Other | Admitting: Urology

## 2020-04-03 DIAGNOSIS — N39498 Other specified urinary incontinence: Secondary | ICD-10-CM | POA: Diagnosis not present

## 2020-04-03 DIAGNOSIS — R5381 Other malaise: Secondary | ICD-10-CM | POA: Diagnosis not present

## 2020-04-03 DIAGNOSIS — I10 Essential (primary) hypertension: Secondary | ICD-10-CM | POA: Diagnosis not present

## 2020-04-03 DIAGNOSIS — E1165 Type 2 diabetes mellitus with hyperglycemia: Secondary | ICD-10-CM | POA: Diagnosis not present

## 2020-04-03 DIAGNOSIS — R319 Hematuria, unspecified: Secondary | ICD-10-CM

## 2020-04-03 DIAGNOSIS — M6281 Muscle weakness (generalized): Secondary | ICD-10-CM | POA: Diagnosis not present

## 2020-04-03 DIAGNOSIS — B0089 Other herpesviral infection: Secondary | ICD-10-CM | POA: Diagnosis not present

## 2020-04-03 DIAGNOSIS — N400 Enlarged prostate without lower urinary tract symptoms: Secondary | ICD-10-CM | POA: Diagnosis not present

## 2020-04-03 DIAGNOSIS — F445 Conversion disorder with seizures or convulsions: Secondary | ICD-10-CM | POA: Diagnosis not present

## 2020-04-03 DIAGNOSIS — E785 Hyperlipidemia, unspecified: Secondary | ICD-10-CM | POA: Diagnosis not present

## 2020-04-03 DIAGNOSIS — R279 Unspecified lack of coordination: Secondary | ICD-10-CM | POA: Diagnosis not present

## 2020-04-03 DIAGNOSIS — F0150 Vascular dementia without behavioral disturbance: Secondary | ICD-10-CM | POA: Diagnosis not present

## 2020-04-03 DIAGNOSIS — I5020 Unspecified systolic (congestive) heart failure: Secondary | ICD-10-CM | POA: Diagnosis not present

## 2020-04-03 DIAGNOSIS — E569 Vitamin deficiency, unspecified: Secondary | ICD-10-CM | POA: Diagnosis not present

## 2020-04-03 DIAGNOSIS — N390 Urinary tract infection, site not specified: Secondary | ICD-10-CM | POA: Diagnosis not present

## 2020-04-03 DIAGNOSIS — R21 Rash and other nonspecific skin eruption: Secondary | ICD-10-CM | POA: Diagnosis not present

## 2020-04-03 DIAGNOSIS — J441 Chronic obstructive pulmonary disease with (acute) exacerbation: Secondary | ICD-10-CM | POA: Diagnosis not present

## 2020-04-03 DIAGNOSIS — I214 Non-ST elevation (NSTEMI) myocardial infarction: Secondary | ICD-10-CM | POA: Diagnosis not present

## 2020-04-03 LAB — BASIC METABOLIC PANEL
Anion gap: 9 (ref 5–15)
Anion gap: 10 (ref 5–15)
BUN: 18 mg/dL (ref 8–23)
BUN: 19 mg/dL (ref 8–23)
CO2: 28 mmol/L (ref 22–32)
CO2: 27 mmol/L (ref 22–32)
Calcium: 9.9 mg/dL (ref 8.9–10.3)
Calcium: 9.8 mg/dL (ref 8.9–10.3)
Chloride: 99 mmol/L (ref 98–111)
Chloride: 98 mmol/L (ref 98–111)
Creatinine, Ser: 0.81 mg/dL (ref 0.61–1.24)
Creatinine, Ser: 0.97 mg/dL (ref 0.61–1.24)
GFR, Estimated: >60 mL/min (ref >60)
GFR, Estimated: >60 mL/min (ref >60)
Glucose, Bld: 163 mg/dL — ABNORMAL HIGH (ref 70–99)
Glucose, Bld: 172 mg/dL — ABNORMAL HIGH (ref 70–99)
Potassium: 4.1 mmol/L (ref 3.5–5.1)
Potassium: 4.2 mmol/L (ref 3.5–5.1)
Sodium: 136 mmol/L (ref 135–145)
Sodium: 135 mmol/L (ref 135–145)

## 2020-04-03 LAB — CBC
HCT: 40.3 % (ref 39.0–52.0)
Hemoglobin: 14.2 g/dL (ref 13.0–17.0)
MCH: 31.3 pg (ref 26.0–34.0)
MCHC: 35.2 g/dL (ref 30.0–36.0)
MCV: 89 fL (ref 80.0–100.0)
Platelets: 280 10*3/uL (ref 150–400)
RBC: 4.53 MIL/uL (ref 4.22–5.81)
RDW: 13 % (ref 11.5–15.5)
WBC: 7.4 10*3/uL (ref 4.0–10.5)
nRBC: 0 % (ref 0.0–0.2)

## 2020-04-03 LAB — GLUCOSE, CAPILLARY
Glucose-Capillary: 164 mg/dL — ABNORMAL HIGH (ref 70–99)
Glucose-Capillary: 211 mg/dL — ABNORMAL HIGH (ref 70–99)

## 2020-04-03 LAB — MAGNESIUM: Magnesium: 1.7 mg/dL (ref 1.7–2.4)

## 2020-04-03 MED ORDER — DOCUSATE SODIUM 100 MG PO CAPS
100.0000 mg | ORAL_CAPSULE | Freq: Two times a day (BID) | ORAL | Status: DC | PRN
  Administered 2020-04-03: 100 mg via ORAL
  Filled 2020-04-03: qty 1

## 2020-04-03 MED ORDER — CEPHALEXIN 500 MG PO CAPS: 500.0000 mg | ORAL_CAPSULE | Freq: Three times a day (TID) | ORAL | 0 refills | Status: AC

## 2020-04-03 MED ORDER — AMLODIPINE BESYLATE 10 MG PO TABS: 10.0000 mg | ORAL_TABLET | Freq: Every day | ORAL | 0 refills | Status: DC

## 2020-04-03 MED ORDER — POLYETHYLENE GLYCOL 3350 17 G PO PACK
17.0000 g | PACK | Freq: Every day | ORAL | Status: DC
  Administered 2020-04-03: 17 g via ORAL
  Filled 2020-04-03: qty 1

## 2020-04-03 MED ORDER — MAGNESIUM HYDROXIDE 400 MG/5ML PO SUSP
30.0000 mL | Freq: Every day | ORAL | Status: DC | PRN
  Administered 2020-04-03: 30 mL via ORAL
  Filled 2020-04-03: qty 30

## 2020-04-03 NOTE — Progress Notes (Signed)
Mobility Specialist - Progress Note   04/03/20 1050  Mobility  Activity Ambulated to bathroom  Level of Assistance Minimal assist, patient does 75% or more  Assistive Device Front wheel walker  Distance Ambulated (ft) 10 ft  Mobility Response Tolerated well  Mobility performed by Mobility specialist  $Mobility charge 1 Mobility    During mobility: 114 HR, 95% SpO2  Pt long-sitting in bed upon arrival. Pt agreed to session. Pt states "my bowels are moving". Pt able to get to EOB mod. Independently. Pt HOH and slightly impulsive. Pt attempted to S2S w/o assist. VC required for safety. Requested pt to wait while mob. Specialist set-up room. Pt showed understanding. Pt S2S to RW w/ CGA. Pt ambulated to bathroom w/ min. Assist. No LOB noted. Pt sitting on toilet. NT notified. States he's having a hard time using the bathroom. Pt left on toilet w/ NT present. Nurse was notified.    Tomoya Ringwald Mobility Specialist  04/03/20, 10:57 AM

## 2020-04-03 NOTE — TOC Transition Note (Signed)
Transition of Care [facility]) - CM/SW Discharge Note   Patient Details  Name: Justin Griffith MRN: [id number] Date of Birth: 1940/03/25  [facility]) CM/SW Contact:  Eileen Stanford, LCSW Phone Number: 04/03/2020, 12:39 PM   Clinical Narrative:   Clinical Social Worker facilitated patient discharge including contacting patient family and facility to confirm patient discharge plans.  Clinical information faxed to facility and family agreeable with plan.  CSW arranged ambulance transport via [facility] to [facility] at 3:00 PM.  RN to call [phone] for report prior to discharge.      Final next level of care: Skilled Nursing Facility Barriers to Discharge: No Barriers Identified   Patient Goals and CMS Choice Patient states their goals for this hospitalization and ongoing recovery are:: to get better   Choice offered to / list presented to : Patient  Discharge Placement              Patient chooses bed at: [facility] [place] Patient to be transferred to facility by: [facility]   Patient and family notified of of transfer: 04/03/20  Discharge Plan and Services In-house Referral: Clinical Social Work   Post Acute Care Choice: [place]                               Social Determinants of Health (SDOH) Interventions     Readmission Risk Interventions No flowsheet data found.

## 2020-04-03 NOTE — Progress Notes (Signed)
Patient is being discharge to SNF [facility] , report called to receiving  Patient awaiting for transport .

## 2020-04-03 NOTE — Discharge Summary (Signed)
Physician Discharge Summary  Justin Griffith [id number] DOB: 09-13-1940 DOA: 03/28/2020  PCP: Guadalupe Maple, MD  Admit date: 03/28/2020 Discharge date: 04/03/2020  Admitted From: home Disposition:  SNF  Recommendations for Outpatient Follow-up:  1. Follow up with PCP in 1-2 weeks 2. F/u w/ cardio, [name], in 1 week 3. F/u w/ urology, Dr. Erlene Quan, in 2-3 weeks  Home Health: no  Equipment/Devices:  Discharge Condition: stable  CODE STATUS: full  Diet recommendation: Heart Healthy / Carb Modified   Brief/Interim Summary: HPI was taken from Dr. Olevia Bowens: Justin Griffith is a 80 y.o. male with medical history significant of osteoarthritis, asthma, chronic back pain, colon polyp, COPD, emphysema of the lung, heart murmur, hernia, hyperlipidemia, hypertension, colon cancer, tobacco use, sleep apnea on CPAP, type 2 diabetes with proteinuria who is coming to the emergency department due to having a syncopal episode at home while in the bathroom hitting his forehead, sustaining a hematoma in the area and one episode of emesis. He has felt dyspneic, but denies chest pain or chest pressure. No palpitations, diaphoresis, PND, orthopnea or recent pitting edema of the lower extremities. He denies fever, chills, sore throat, rhinorrhea, wheezing or hemoptysis. No abdominal pain, diarrhea, constipation, melena or hematochezia. Complains of some dysuria, nocturia, and difficulty initiating urination with frequency, but denies hematuria. He denies polyuria, polydipsia, polyphagia or blurred vision.  ED Course: Initial vital signs were temperature 98.8 F, pulse 74, respiration 18, BP 119/60 mmHg and O2 sat 92% on room air.  Hospital Course from Dr. Jimmye Norman 04/03/20: Pt was found to have NSTEMI that was medically treated as per cardio. Pt was initially put on IV heparin drip that was d/c secondary to hematuria which has since resolved. Pt will f/u w/ cardio, [name], in 1 week. Furthermore, pt was  treated for UTI w/ IV abxs initially and then transition to po keflex x 5 days total. PT/OT saw the pt and recommended SNF. For more information, please see previous progress/consult notes.  Discharge Diagnoses:  Principal Problem:   NSTEMI (non-ST elevated myocardial infarction) ([place]) Active Problems:   Sleep apnea   BPH (benign prostatic hyperplasia)   COPD (chronic obstructive pulmonary disease) (HCC)   Syncope   Memory changes   Coronary artery disease   Hypokalemia   Type 2 diabetes mellitus with hyperglycemia (HCC)   Leukocytosis   Class 2 obesity   Hypomagnesemia   Hypophosphatemia   Hypertension   Hyponatremia   Urinary tract infection    NSTEMI: w/ elevated troponins. Defer further cardiac diagnostics currently and f/u outpatient in 1 week w/ [name] as per cardio. Continue on aspirin, statin, coreg, ACE-I. Echo shows EF [id number], grade I diastolic dysfunction  Syncope: etiology unclear. CT head shows no acute intracranial findings. Carotid US showed bilateral carotid bifurcation plaque resulting in less than 50% diameter ICA stenosis. PT/OT recs SNF  UTI: w/ gross hematuria which has now resolved. Continue on keflex. Urology consulted recommended to continue to hold anticoagulation, continue antibiotics. Outpatient cystoscopy appointment with Dr. Erlene Quan was from a rescheduled for a few weeks from now  HTN: uncontrolled. Continue on coreg, ACE-I, amlodipine. D/c clonidine  AKI: resolved  Hyponatremia: resolved  Hypokalemia:  Hypomagnesemia:   BPH: restart flomax   COPD: w/o exacerbation. Continue on bronchodilators and encourage incentive spirometry   Memory changes: continue donepezil  Morbid obesity: BMI 33.4.  Complicates overall care and prognosis  DM2: well controlled w/ HbA1c 6.9. Continue to hold metformin. Continue on lantus,  novolog w/ meals & SSI w/ accuchecks   Hypophosphatemia: resolved.  HLD: continue statin  Chronic  diastolic CHF: continue on coreg, losartan & lasix. Monitor I/Os.   Chronic back pain/arthritis: continue on oxycodone prn   Discharge Instructions  Discharge Instructions    Diet - low sodium heart healthy   Complete by: As directed    Diet Carb Modified   Complete by: As directed    Discharge instructions   Complete by: As directed    F/u cardio, [name], in 1 week. F/u w/ urology, Dr. Erlene Quan, in 2-3 weeks. F/u PCP in 1-2 weeks   Increase activity slowly   Complete by: As directed      Allergies as of 04/03/2020      Reactions   Farxiga [dapagliflozin] Rash   Jardiance [empagliflozin] Rash      Medication List    STOP taking these medications   cloNIDine 0.2 MG tablet Commonly known as: CATAPRES     TAKE these medications   acyclovir 400 MG tablet Commonly known as: ZOVIRAX Take 400 mg by mouth 2 (two) times daily.   amLODipine 10 MG tablet Commonly known as: NORVASC Take 1 tablet (10 mg total) by mouth daily. Start taking on: April 04, 2020   aspirin 325 MG EC tablet Take 1 tablet (325 mg total) by mouth daily.   azelastine 0.1 % nasal spray Commonly known as: ASTELIN Place 2 sprays into both nostrils 2 (two) times daily.   carvedilol 12.5 MG tablet Commonly known as: COREG Take 2 tablets (25 mg total) by mouth 2 (two) times daily with a meal.   cephALEXin 500 MG capsule Commonly known as: KEFLEX Take 1 capsule (500 mg total) by mouth every 8 (eight) hours for 4 days.   clobetasol cream 0.05 % Commonly known as: TEMOVATE Apply 1 application topically 2 (two) times daily. Apply to bilateral lower legs.   donepezil 5 MG tablet Commonly known as: Aricept Take 1 tablet (5 mg total) by mouth at bedtime.   furosemide 20 MG tablet Commonly known as: LASIX Take 1 tablet (20 mg total) by mouth daily.   insulin glargine 100 UNIT/ML injection Commonly known as: Lantus Inject 0.25 mLs (25 Units total) into the skin daily.   ipratropium-albuterol  0.5-2.5 (3) MG/3ML Soln Commonly known as: DUONEB Take 3 mLs by nebulization every 6 (six) hours.   metFORMIN 1000 MG tablet Commonly known as: GLUCOPHAGE Take 1,000 mg by mouth 2 (two) times daily with a meal.   mupirocin ointment 2 % Commonly known as: BACTROBAN Apply 1 application topically 2 (two) times daily. To open blisters bilateral legs.   olmesartan 40 MG tablet Commonly known as: BENICAR Take 1 tablet (40 mg total) by mouth daily.   primidone 50 MG tablet Commonly known as: MYSOLINE Take 1 tablet (50 mg total) by mouth 2 (two) times daily. What changed: how much to take   Qvar RediHaler 40 MCG/ACT inhaler Generic drug: beclomethasone 1 puff 2 (two) times daily.   rosuvastatin 40 MG tablet Commonly known as: CRESTOR Take 1 tablet (40 mg total) by mouth at bedtime.   tamsulosin 0.4 MG Caps capsule Commonly known as: FLOMAX Take 0.4 mg by mouth daily. Patient restarted on his own due to trouble urinating   vitamin B-12 500 MCG tablet Commonly known as: CYANOCOBALAMIN Take 2,000 mcg by mouth at bedtime.   Vitamin D (Cholecalciferol) 25 MCG (1000 UT) Caps Take 2,000 mcg by mouth at bedtime.  Contact information for after-discharge care    Destination    [facility]erred [facility] .   Service: Skilled Nursing Contact information: [place] [phone]                 Allergies  Allergen Reactions  . Farxiga [Dapagliflozin] Rash  . Jardiance [Empagliflozin] Rash    Consultations:  Cardio  uro   Procedures/Studies: CT HEAD WO CONTRAST  Result Date: 03/28/2020 CLINICAL DATA:  Head trauma, moderate/severe. Additional history provided: Syncopal episode with fall and head trauma. EXAM: CT HEAD WITHOUT CONTRAST TECHNIQUE: Contiguous axial images were obtained from the base of the skull through the vertex without intravenous contrast. COMPARISON:  Head CT 01/23/2020.  Brain MRI  12/14/2019. FINDINGS: Brain: Mild cerebral atrophy. Mild ill-defined hypoattenuation within the cerebral white matter is nonspecific, but compatible with chronic small vessel ischemic disease. There is no acute intracranial hemorrhage. No demarcated cortical infarct. No extra-axial fluid collection. No evidence of intracranial mass. No midline shift. Vascular: No hyperdense vessel.  Atherosclerotic calcifications. Skull: Normal. Negative for fracture or focal lesion. Sinuses/Orbits: Visualized orbits show no acute finding. Mild ethmoid sinus mucosal thickening. Bilateral mastoid effusions. Other: Right forehead hematoma. IMPRESSION: No evidence of acute intracranial abnormality. Right forehead hematoma. Mild cerebral atrophy and chronic small vessel ischemic disease. Mild ethmoid sinus mucosal thickening. Bilateral mastoid effusions. Electronically Signed   By: Kellie Simmering DO   On: 03/28/2020 16:21   US Carotid Bilateral  Result Date: 03/29/2020 CLINICAL DATA:  Syncope. Hypertension, hyperlipidemia, diabetes, history of tobacco abuse EXAM: BILATERAL CAROTID DUPLEX ULTRASOUND TECHNIQUE: Pearline Cables scale imaging, color Doppler and duplex ultrasound were performed of bilateral carotid and vertebral arteries in the neck. COMPARISON:  12/15/2019 FINDINGS: Criteria: Quantification of carotid stenosis is based on velocity parameters that correlate the residual internal carotid diameter with NASCET-based stenosis levels, using the diameter of the distal internal carotid lumen as the denominator for stenosis measurement. The following velocity measurements were obtained: RIGHT ICA: 101/26 cm/sec CCA: [id number] cm/sec SYSTOLIC ICA/CCA RATIO:  1.0 ECA: 239 cm/sec LEFT ICA: 121/32 cm/sec CCA: [id number] cm/sec SYSTOLIC ICA/CCA RATIO:  1.0 ECA: 136 cm/sec RIGHT CAROTID ARTERY: Eccentric partially calcified plaque in the proximal internal and external carotid arteries. Mildly elevated peak systolic velocities in the ECA. Some distal  acoustic shadowing in the proximal ICA. Elsewhere normal waveforms and color Doppler signal. No high-grade stenosis. RIGHT VERTEBRAL ARTERY:  Normal flow direction and waveform. LEFT CAROTID ARTERY: Calcified plaque in the bulb and proximal ICA without high-grade stenosis. Normal waveforms and color Doppler signal. LEFT VERTEBRAL ARTERY:  Normal flow direction and waveform. IMPRESSION: 1. Bilateral carotid bifurcation plaque resulting in less than 50% diameter ICA stenosis. 2. Antegrade bilateral vertebral arterial flow. Electronically Signed   By: Lucrezia Europe M.D.   On: 03/29/2020 11:06   DG Chest Portable 1 View  Result Date: 03/28/2020 CLINICAL DATA:  Acute shortness of breath. EXAM: PORTABLE CHEST 1 VIEW COMPARISON:  01/23/2020 FINDINGS: Cardiomediastinal silhouette is unchanged. Pulmonary vascular congestion is present. Peribronchial thickening and LEFT basilar atelectasis/scarring again noted. There is no evidence of focal airspace disease, pulmonary edema, suspicious pulmonary nodule/mass, pleural effusion, or pneumothorax. No acute bony abnormalities are identified. IMPRESSION: Pulmonary vascular congestion. Electronically Signed   By: Margarette Canada M.D.   On: 03/28/2020 18:20   ECHOCARDIOGRAM COMPLETE  Result Date: 03/29/2020    ECHOCARDIOGRAM REPORT   Patient Name:   Justin Griffith Date of Exam: 03/29/2020 Medical Rec #:  [id number]      Height:       70.0 in Accession #:    [id number]     Weight:       276.0 lb Date of Birth:  July 09, 1940      BSA:          2.394 m Patient Age:    79 years       BP:           123/69 mmHg Patient Gender: M              HR:           60 bpm. Exam Location:  [facility] Procedure: 2D Echo, Color Doppler, Cardiac Doppler and Intracardiac            Opacification Agent Indications:     I21.4 NSTEMI  History:         Patient has prior history of Echocardiogram examinations, most                  recent 01/24/2020. COPD; Risk Factors:Diabetes, Sleep Apnea,                  Hypertension  and Dyslipidemia.  Sonographer:     Humphrey RollsJoan Heiss RDCS (AE) Referring Phys:  [id number] DAVID Kelby FamMANUEL ORTIZ Diagnosing Phys: Marcina MillardAlexander Paraschos MD  Sonographer Comments: Suboptimal apical window. Image acquisition challenging due to patient body habitus and Image acquisition challenging due to COPD. IMPRESSIONS  1. Left ventricular ejection fraction, by estimation, is 60 to 65%. The left ventricle has normal function. The left ventricle has no regional wall motion abnormalities. Left ventricular diastolic parameters are consistent with Grade I diastolic dysfunction (impaired relaxation).  2. Right ventricular systolic function is normal. The right ventricular size is normal.  3. The mitral valve is normal in structure. Mild mitral valve regurgitation. No evidence of mitral stenosis.  4. The aortic valve is normal in structure. Aortic valve regurgitation is not visualized. Mild aortic valve stenosis.  5. The inferior vena cava is normal in size with greater than 50% respiratory variability, suggesting right atrial pressure of 3 mmHg. FINDINGS  Left Ventricle: Left ventricular ejection fraction, by estimation, is 60 to 65%. The left ventricle has normal function. The left ventricle has no regional wall motion abnormalities. Definity contrast agent was given IV to delineate the left ventricular  endocardial borders. The left ventricular internal cavity size was normal in size. There is no left ventricular hypertrophy. Left ventricular diastolic parameters are consistent with Grade I diastolic dysfunction (impaired relaxation). Right Ventricle: The right ventricular size is normal. No increase in right ventricular wall thickness. Right ventricular systolic function is normal. Left Atrium: Left atrial size was normal in size. Right Atrium: Right atrial size was normal in size. Pericardium: There is no evidence of pericardial effusion. Mitral Valve: The mitral valve is normal in structure. Mild mitral valve regurgitation. No  evidence of mitral valve stenosis. MV peak gradient, 4.2 mmHg. The mean mitral valve gradient is 2.0 mmHg. Tricuspid Valve: The tricuspid valve is normal in structure. Tricuspid valve regurgitation is mild . No evidence of tricuspid stenosis. Aortic Valve: The aortic valve is normal in structure. Aortic valve regurgitation is not visualized. Mild aortic stenosis is present. Aortic valve mean gradient measures 12.0 mmHg. Aortic valve peak gradient measures 23.9 mmHg. Aortic valve area, by VTI measures 1.87 cm. Pulmonic Valve: The pulmonic valve was normal in structure. Pulmonic valve regurgitation is not visualized. No evidence of pulmonic stenosis. Aorta:  The aortic root is normal in size and structure. Venous: The inferior vena cava is normal in size with greater than 50% respiratory variability, suggesting right atrial pressure of 3 mmHg. IAS/Shunts: No atrial level shunt detected by color flow Doppler.  LEFT VENTRICLE PLAX 2D LVIDd:         4.30 cm  Diastology LVIDs:         2.80 cm  LV e' medial:    6.20 cm/s LV PW:         1.40 cm  LV E/e' medial:  12.4 LV IVS:        1.20 cm  LV e' lateral:   4.68 cm/s LVOT diam:     2.20 cm  LV E/e' lateral: 16.5 LV SV:         92 LV SV Index:   38 LVOT Area:     3.80 cm  RIGHT VENTRICLE RV Basal diam:  4.50 cm LEFT ATRIUM             Index       RIGHT ATRIUM           Index LA diam:        3.80 cm 1.59 cm/m  RA Area:     21.50 cm LA Vol (A2C):   58.5 ml 24.44 ml/m RA Volume:   55.50 ml  23.19 ml/m LA Vol (A4C):   78.2 ml 32.67 ml/m LA Biplane Vol: 69.5 ml 29.04 ml/m  AORTIC VALVE                    PULMONIC VALVE AV Area (Vmax):    1.85 cm     PV Vmax:       1.51 m/s AV Area (Vmean):   1.90 cm     PV Vmean:      109.000 cm/s AV Area (VTI):     1.87 cm     PV VTI:        0.347 m AV Vmax:           244.50 cm/s  PV Peak grad:  9.1 mmHg AV Vmean:          157.500 cm/s PV Mean grad:  5.0 mmHg AV VTI:            0.491 m AV Peak Grad:      23.9 mmHg AV Mean Grad:       12.0 mmHg LVOT Vmax:         119.00 cm/s LVOT Vmean:        78.800 cm/s LVOT VTI:          0.241 m LVOT/AV VTI ratio: 0.49  AORTA Ao Root diam: 3.50 cm MITRAL VALVE MV Area (PHT): 2.94 cm    SHUNTS MV Peak grad:  4.2 mmHg    Systemic VTI:  0.24 m MV Mean grad:  2.0 mmHg    Systemic Diam: 2.20 cm MV Vmax:       1.03 m/s MV Vmean:      72.4 cm/s MV Decel Time: 258 msec MV E velocity: 77.10 cm/s MV A velocity: 87.30 cm/s MV E/A ratio:  0.88 Marcina MillardAlexander Paraschos MD Electronically signed by Marcina MillardAlexander Paraschos MD Signature Date/Time: 03/29/2020/4:03:13 PM    Final    CT RENAL STONE STUDY  Result Date: 03/29/2020 CLINICAL DATA:  Hematuria with unknown cause EXAM: CT ABDOMEN AND PELVIS WITHOUT CONTRAST TECHNIQUE: Multidetector CT imaging of the abdomen and pelvis was performed following the standard protocol  without IV contrast. COMPARISON:  01/23/2020 FINDINGS: Lower chest: 7 mm right lower lobe pulmonary nodule, benign based on previous imaging. Hepatobiliary: No focal liver abnormality.Absent gallbladder. No bile duct dilatation. Pancreas: Unremarkable. Spleen: Unremarkable. Adrenals/Urinary Tract: Negative adrenals. No hydronephrosis or stone. Chronic perinephric stranding, nonspecific. Thick walled appearance of the bladder without over distension, likely chronic outlet obstruction given the prostate. Stomach/Bowel: No obstruction. No appendicitis. Postoperative colon with rectosigmoid anastomosis. Vascular/Lymphatic: No acute vascular abnormality. Diffuse atheromatous calcification. No mass or adenopathy. Reproductive:Enlarged prostate up lifting the bladder base. Other: No ascites or pneumoperitoneum. Musculoskeletal: No acute abnormalities. Advanced, generalized spinal degeneration. IMPRESSION: 1. No acute finding.  No hydronephrosis or urolithiasis. 2. Enlarged prostate with thick walled bladder suggesting chronic outlet obstruction. No bladder dilatation or visible clot. Electronically Signed   By: Marnee Spring M.D.   On: 03/29/2020 08:49       Subjective: Pt c/o fatigue   Discharge Exam: Vitals:   04/03/20 0820 04/03/20 1127  BP: (!) 164/81 137/66  Pulse: 67 66  Resp: 18 18  Temp: 98.5 F (36.9 C) 97.7 F (36.5 C)  SpO2: 91% 92%   Vitals:   04/03/20 0553 04/03/20 0633 04/03/20 0820 04/03/20 1127  BP: (!) 160/72  (!) 164/81 137/66  Pulse:   67 66  Resp:   18 18  Temp:   98.5 F (36.9 C) 97.7 F (36.5 C)  TempSrc:   Oral Oral  SpO2:   91% 92%  Weight:  105.8 kg    Height:        General: Pt is alert, awake, not in acute distress Cardiovascular:  S1/S2 +, no rubs, no gallops Respiratory: CTA bilaterally, no wheezing, no rhonchi Abdominal: Soft, NT, obese, bowel sounds + Extremities:  no cyanosis    The results of significant diagnostics from this hospitalization (including imaging, microbiology, ancillary and laboratory) are listed below for reference.     Microbiology: Recent Results (from the past 240 hour(s))  Resp Panel by RT-PCR (Flu A&B, Covid) Nasopharyngeal Swab     Status: None   Collection Time: 03/28/20  6:18 PM   Specimen: Nasopharyngeal Swab; Nasopharyngeal(NP) swabs in vial transport medium  Result Value Ref Range Status   SARS Coronavirus 2 by RT PCR NEGATIVE NEGATIVE Final    Comment: (NOTE) SARS-CoV-2 target nucleic acids are NOT DETECTED.  The SARS-CoV-2 RNA is generally detectable in upper respiratory specimens during the acute phase of infection. The lowest concentration of SARS-CoV-2 viral copies this assay can detect is 138 copies/mL. A negative result does not preclude SARS-Cov-2 infection and should not be used as the sole basis for treatment or other patient management decisions. A negative result may occur with  improper specimen collection/handling, submission of specimen other than nasopharyngeal swab, presence of viral mutation(s) within the areas targeted by this assay, and inadequate number of viral copies(<138 copies/mL). A  negative result must be combined with clinical observations, patient history, and epidemiological information. The expected result is Negative.  Fact Sheet for Patients:  BloggerCourse.com  Fact Sheet for Healthcare Providers:  SeriousBroker.it  This test is no t yet approved or cleared by the [place] FDA and  has been authorized for detection and/or diagnosis of SARS-CoV-2 by FDA under an Emergency Use Authorization (EUA). This EUA will remain  in effect (meaning this test can be used) for the duration of the COVID-19 declaration under Section 564(b)(1) of the Act, 21 U.S.C.section 360bbb-3(b)(1), unless the authorization is terminated  or revoked sooner.  Influenza A by PCR NEGATIVE NEGATIVE Final   Influenza B by PCR NEGATIVE NEGATIVE Final    Comment: (NOTE) The Xpert Xpress SARS-CoV-2/FLU/RSV plus assay is intended as an aid in the diagnosis of influenza from Nasopharyngeal swab specimens and should not be used as a sole basis for treatment. Nasal washings and aspirates are unacceptable for Xpert Xpress SARS-CoV-2/FLU/RSV testing.  Fact Sheet for Patients: EntrepreneurPulse.com.au  Fact Sheet for Healthcare Providers: IncredibleEmployment.be  This test is not yet approved or cleared by the [place] FDA and has been authorized for detection and/or diagnosis of SARS-CoV-2 by FDA under an Emergency Use Authorization (EUA). This EUA will remain in effect (meaning this test can be used) for the duration of the COVID-19 declaration under Section 564(b)(1) of the Act, 21 U.S.C. section 360bbb-3(b)(1), unless the authorization is terminated or revoked.  Performed at [facility], [address], [place], [place] [address]   Urine Culture     Status: None   Collection Time: 03/29/20  7:24 PM   Specimen: Urine, Random  Result Value Ref Range Status   Specimen  Description   Final    URINE, RANDOM Performed at [facility], [address] [place], [place] [address]    Special Requests   Final    NONE Performed at [facility], [address], [name], [place] [address]    Culture   Final    NO GROWTH Performed at [facility], [place] [address] [place], [place] [address]    Report Status 03/31/2020 FINAL  Final  Resp Panel by RT-PCR (Flu A&B, Covid) Nasopharyngeal Swab     Status: None   Collection Time: 04/01/20  1:42 PM   Specimen: Nasopharyngeal Swab; Nasopharyngeal(NP) swabs in vial transport medium  Result Value Ref Range Status   SARS Coronavirus 2 by RT PCR NEGATIVE NEGATIVE Final    Comment: (NOTE) SARS-CoV-2 target nucleic acids are NOT DETECTED.  The SARS-CoV-2 RNA is generally detectable in upper respiratory specimens during the acute phase of infection. The lowest concentration of SARS-CoV-2 viral copies this assay can detect is 138 copies/mL. A negative result does not preclude SARS-Cov-2 infection and should not be used as the sole basis for treatment or other patient management decisions. A negative result may occur with  improper specimen collection/handling, submission of specimen other than nasopharyngeal swab, presence of viral mutation(s) within the areas targeted by this assay, and inadequate number of viral copies(<138 copies/mL). A negative result must be combined with clinical observations, patient history, and epidemiological information. The expected result is Negative.  Fact Sheet for Patients:  EntrepreneurPulse.com.au  Fact Sheet for Healthcare Providers:  IncredibleEmployment.be  This test is no t yet approved or cleared by the [place] FDA and  has been authorized for detection and/or diagnosis of SARS-CoV-2 by FDA under an Emergency Use Authorization (EUA). This EUA will remain  in effect (meaning this test can be used) for the  duration of the COVID-19 declaration under Section 564(b)(1) of the Act, 21 U.S.C.section 360bbb-3(b)(1), unless the authorization is terminated  or revoked sooner.       Influenza A by PCR NEGATIVE NEGATIVE Final   Influenza B by PCR NEGATIVE NEGATIVE Final    Comment: (NOTE) The Xpert Xpress SARS-CoV-2/FLU/RSV plus assay is intended as an aid in the diagnosis of influenza from Nasopharyngeal swab specimens and should not be used as a sole basis for treatment. Nasal washings and aspirates are unacceptable for Xpert Xpress SARS-CoV-2/FLU/RSV testing.  Fact Sheet for Patients: EntrepreneurPulse.com.au  Fact Sheet for Healthcare Providers: IncredibleEmployment.be  This test is not yet approved or cleared by the [place] FDA and has been authorized for detection and/or diagnosis of SARS-CoV-2 by FDA under an Emergency Use Authorization (EUA). This EUA will remain in effect (meaning this test can be used) for the duration of the COVID-19 declaration under Section 564(b)(1) of the Act, 21 U.S.C. section 360bbb-3(b)(1), unless the authorization is terminated or revoked.  Performed at [facility], [address] [place], [place] [address]      Labs: BNP (last 3 results) Recent Labs    01/23/20 1617 03/28/20 1818  BNP 76.3 [id number]*   Basic Metabolic Panel: Recent Labs  Lab 03/28/20 1535 03/29/20 0346 03/30/20 0316 03/31/20 0405 04/01/20 0218 04/02/20 0814 04/03/20 0442 04/03/20 0801  NA 130*   < > 134* 136 135 132* 136 135  K 3.4*   < > 3.3* 3.8 3.8 4.4 4.1 4.2  CL 93*   < > 96* 100 95* 96* 99 98  CO2 25   < > 27 27 29 24 28 27   GLUCOSE 214*   < > 135* 137* 173* 187* 163* 172*  BUN 16   < > 28* 26* 18 15 18 19   CREATININE 1.08   < > 1.35* 1.02 1.00 0.92 0.81 0.97  CALCIUM 9.5   < > 9.1 9.4 9.8 10.4* 9.9 9.8  MG 1.2*  --  1.6* 2.0  --   --  1.7  --   PHOS 2.3*  --  4.1  --   --   --   --   --    < > = values in  this interval not displayed.   Liver Function Tests: Recent Labs  Lab 03/30/20 0316 03/31/20 0405  AST 33 25  ALT 20 24  ALKPHOS 45 49  BILITOT 1.0 0.7  PROT 6.6 6.6  ALBUMIN 2.7* 2.8*   No results for input(s): LIPASE, AMYLASE in the last 168 hours. No results for input(s): AMMONIA in the last 168 hours. CBC: Recent Labs  Lab 03/30/20 0316 03/31/20 0405 04/01/20 0218 04/02/20 0814 04/03/20 0801  WBC 10.6* 7.1 8.1 9.1 7.4  HGB 12.6* 12.9* 13.6 14.9 14.2  HCT 36.8* 37.6* 39.0 43.7 40.3  MCV 90.4 90.8 89.9 89.9 89.0  PLT 229 245 285 305 280   Cardiac Enzymes: No results for input(s): CKTOTAL, CKMB, CKMBINDEX, TROPONINI in the last 168 hours. BNP: Invalid input(s): POCBNP CBG: Recent Labs  Lab 04/02/20 1243 04/02/20 1627 04/02/20 1952 04/03/20 0820 04/03/20 1128  GLUCAP 210* 188* 216* 164* 211*   D-Dimer No results for input(s): DDIMER in the last 72 hours. Hgb A1c No results for input(s): HGBA1C in the last 72 hours. Lipid Profile No results for input(s): CHOL, HDL, LDLCALC, TRIG, CHOLHDL, LDLDIRECT in the last 72 hours. Thyroid function studies No results for input(s): TSH, T4TOTAL, T3FREE, THYROIDAB in the last 72 hours.  Invalid input(s): FREET3 Anemia work up No results for input(s): VITAMINB12, FOLATE, FERRITIN, TIBC, IRON, RETICCTPCT in the last 72 hours. Urinalysis    Component Value Date/Time   COLORURINE AMBER (A) 03/28/2020 2019   APPEARANCEUR CLOUDY (A) 03/28/2020 2019   APPEARANCEUR Cloudy (A) 02/29/2020 1046   LABSPEC 1.016 03/28/2020 2019   PHURINE 5.0 03/28/2020 2019   GLUCOSEU NEGATIVE 03/28/2020 2019   HGBUR SMALL (A) 03/28/2020 2019   BILIRUBINUR NEGATIVE 03/28/2020 2019   BILIRUBINUR Negative 02/29/2020 [place] 03/28/2020  2019   PROTEINUR >=300 (A) 03/28/2020 2019   NITRITE NEGATIVE 03/28/2020 2019   LEUKOCYTESUR MODERATE (A) 03/28/2020 2019   Sepsis Labs Invalid input(s): PROCALCITONIN,  WBC,   LACTICIDVEN Microbiology Recent Results (from the past 240 hour(s))  Resp Panel by RT-PCR (Flu A&B, Covid) Nasopharyngeal Swab     Status: None   Collection Time: 03/28/20  6:18 PM   Specimen: Nasopharyngeal Swab; Nasopharyngeal(NP) swabs in vial transport medium  Result Value Ref Range Status   SARS Coronavirus 2 by RT PCR NEGATIVE NEGATIVE Final    Comment: (NOTE) SARS-CoV-2 target nucleic acids are NOT DETECTED.  The SARS-CoV-2 RNA is generally detectable in upper respiratory specimens during the acute phase of infection. The lowest concentration of SARS-CoV-2 viral copies this assay can detect is 138 copies/mL. A negative result does not preclude SARS-Cov-2 infection and should not be used as the sole basis for treatment or other patient management decisions. A negative result may occur with  improper specimen collection/handling, submission of specimen other than nasopharyngeal swab, presence of viral mutation(s) within the areas targeted by this assay, and inadequate number of viral copies(<138 copies/mL). A negative result must be combined with clinical observations, patient history, and epidemiological information. The expected result is Negative.  Fact Sheet for Patients:  EntrepreneurPulse.com.au  Fact Sheet for Healthcare Providers:  IncredibleEmployment.be  This test is no t yet approved or cleared by the [place] FDA and  has been authorized for detection and/or diagnosis of SARS-CoV-2 by FDA under an Emergency Use Authorization (EUA). This EUA will remain  in effect (meaning this test can be used) for the duration of the COVID-19 declaration under Section 564(b)(1) of the Act, 21 U.S.C.section 360bbb-3(b)(1), unless the authorization is terminated  or revoked sooner.       Influenza A by PCR NEGATIVE NEGATIVE Final   Influenza B by PCR NEGATIVE NEGATIVE Final    Comment: (NOTE) The Xpert Xpress SARS-CoV-2/FLU/RSV plus  assay is intended as an aid in the diagnosis of influenza from Nasopharyngeal swab specimens and should not be used as a sole basis for treatment. Nasal washings and aspirates are unacceptable for Xpert Xpress SARS-CoV-2/FLU/RSV testing.  Fact Sheet for Patients: EntrepreneurPulse.com.au  Fact Sheet for Healthcare Providers: IncredibleEmployment.be  This test is not yet approved or cleared by the [place] FDA and has been authorized for detection and/or diagnosis of SARS-CoV-2 by FDA under an Emergency Use Authorization (EUA). This EUA will remain in effect (meaning this test can be used) for the duration of the COVID-19 declaration under Section 564(b)(1) of the Act, 21 U.S.C. section 360bbb-3(b)(1), unless the authorization is terminated or revoked.  Performed at [facility], [address], [place], [place] [address]   Urine Culture     Status: None   Collection Time: 03/29/20  7:24 PM   Specimen: Urine, Random  Result Value Ref Range Status   Specimen Description   Final    URINE, RANDOM Performed at [facility], [address], [place], [place] [address]    Special Requests   Final    NONE Performed at [facility], [address], [place], [place] [address]    Culture   Final    NO GROWTH Performed at [facility], [place] [address] [place],  [address]    Report Status 03/31/2020 FINAL  Final  Resp Panel by RT-PCR (Flu A&B, Covid) Nasopharyngeal Swab     Status: None   Collection Time: 04/01/20  1:42 PM  Specimen: Nasopharyngeal Swab; Nasopharyngeal(NP) swabs in vial transport medium  Result Value Ref Range Status   SARS Coronavirus 2 by RT PCR NEGATIVE NEGATIVE Final    Comment: (NOTE) SARS-CoV-2 target nucleic acids are NOT DETECTED.  The SARS-CoV-2 RNA is generally detectable in upper respiratory specimens during the acute phase of infection. The lowest concentration of  SARS-CoV-2 viral copies this assay can detect is 138 copies/mL. A negative result does not preclude SARS-Cov-2 infection and should not be used as the sole basis for treatment or other patient management decisions. A negative result may occur with  improper specimen collection/handling, submission of specimen other than nasopharyngeal swab, presence of viral mutation(s) within the areas targeted by this assay, and inadequate number of viral copies(<138 copies/mL). A negative result must be combined with clinical observations, patient history, and epidemiological information. The expected result is Negative.  Fact Sheet for Patients:  EntrepreneurPulse.com.au  Fact Sheet for Healthcare Providers:  IncredibleEmployment.be  This test is no t yet approved or cleared by the [place] FDA and  has been authorized for detection and/or diagnosis of SARS-CoV-2 by FDA under an Emergency Use Authorization (EUA). This EUA will remain  in effect (meaning this test can be used) for the duration of the COVID-19 declaration under Section 564(b)(1) of the Act, 21 U.S.C.section 360bbb-3(b)(1), unless the authorization is terminated  or revoked sooner.       Influenza A by PCR NEGATIVE NEGATIVE Final   Influenza B by PCR NEGATIVE NEGATIVE Final    Comment: (NOTE) The Xpert Xpress SARS-CoV-2/FLU/RSV plus assay is intended as an aid in the diagnosis of influenza from Nasopharyngeal swab specimens and should not be used as a sole basis for treatment. Nasal washings and aspirates are unacceptable for Xpert Xpress SARS-CoV-2/FLU/RSV testing.  Fact Sheet for Patients: EntrepreneurPulse.com.au  Fact Sheet for Healthcare Providers: IncredibleEmployment.be  This test is not yet approved or cleared by the [place] FDA and has been authorized for detection and/or diagnosis of SARS-CoV-2 by FDA under an Emergency Use  Authorization (EUA). This EUA will remain in effect (meaning this test can be used) for the duration of the COVID-19 declaration under Section 564(b)(1) of the Act, 21 U.S.C. section 360bbb-3(b)(1), unless the authorization is terminated or revoked.  Performed at [facility], [address], [place], [place] [address]      Time coordinating discharge: Over 30 minutes  SIGNED:   Wyvonnia Dusky, MD  [facility] 04/03/2020, 11:40 AM Pager   If 7PM-7AM, please contact night-coverage

## 2020-04-04 ENCOUNTER — Encounter (INDEPENDENT_AMBULATORY_CARE_PROVIDER_SITE_OTHER): Payer: Medicare Other | Admitting: Vascular Surgery

## 2020-04-10 ENCOUNTER — Telehealth: Payer: Self-pay

## 2020-04-10 ENCOUNTER — Other Ambulatory Visit: Payer: Self-pay | Admitting: Nurse Practitioner

## 2020-04-10 DIAGNOSIS — E1165 Type 2 diabetes mellitus with hyperglycemia: Secondary | ICD-10-CM

## 2020-04-10 DIAGNOSIS — I152 Hypertension secondary to endocrine disorders: Secondary | ICD-10-CM

## 2020-04-10 NOTE — Telephone Encounter (Signed)
Okay with verbal orders for nursing, OT, PT under his name Justin Griffith -- please alert them.

## 2020-04-10 NOTE — Telephone Encounter (Signed)
Received a call from Santiago Glad with [facility] this morning. She states that they were with this patient, patient was admitted to the hospital last week and then discharged to [facility] for 1 day. She states that at [facility] they had the patients name wrong, had him as Herbie Baltimore instead of [employer]. She is requesting new orders for Nursing, PT, and OT so they can continue with this patient.

## 2020-04-10 NOTE — Telephone Encounter (Signed)
Patient has been rescheduled.

## 2020-04-10 NOTE — Telephone Encounter (Signed)
Sorry, was confused.  I have placed new referral in his chart.

## 2020-04-10 NOTE — Telephone Encounter (Signed)
The dont need verbal orders, they need a new order like a referral sent over to [facility].

## 2020-04-15 ENCOUNTER — Ambulatory Visit: Payer: Medicare Other | Admitting: Pharmacist

## 2020-04-15 NOTE — Chronic Care Management (AMB) (Incomplete)
Chronic Care Management Pharmacy  Name: Justin Griffith  MRN: [id number] DOB: December 03, 1940   Chief Complaint/ HPI  Justin Griffith,  80 y.o. , male presents for their Follow-Up CCM visit with the clinical pharmacist via telephone due to COVID-19 Pandemic.Previously seen by [facility] Pharmacist  PCP : Justin Maple, MD Patient Care Team: Justin Maple, MD as PCP - General (Family Medicine) Justin Maple, MD (Family Medicine) Justin Griffith, Justin Gleason, MD (General Surgery) Carloyn Manner, MD as Referring Physician (Otolaryngology) Delana Meyer, Dolores Lory, MD (Vascular Surgery) Abisogun, Domenica Reamer, MD as Consulting Physician (Internal Medicine) Vanita Ingles, RN as Registered Nurse ([place]) Vladimir Faster, [facility] as Pharmacist (Pharmacist)  Their chronic conditions include: Hypertension, Hyperlipidemia, Diabetes, COPD, BPH and PVD   Office Visits: 10/20/19-Justin Roosevelt Locks, NP- Urinary urgency, neg UTI, PSA level, follow up wound check 10/16/19-Jessica Roosevelt Locks, NP- Abscess lower right pannus I & D, doxycycline x 7 d 08/23/19- Justin Guarneri, NP- lipid panel,B12 level  Consult Visit: 03/28/20-04/03/20- Admitted [facility]- NSTEMI, echol 60-65% EF grade 1 daistolic , asa, statin, coreg, ACE-I, UTI with gross hematuria 10/04/19- Dr. Carlyle Griffith- Decrease carvedilol to 12.76m bid, compression stockings and elevation for edema 7/7/212 -Dr. OHonor Junes Griffith - No changes based on pt report of BG  Allergies  Allergen Reactions  . Farxiga [Dapagliflozin] Rash  . Jardiance [Empagliflozin] Rash    Medications: Outpatient Encounter Medications as of 04/15/2020  Medication Sig  . acyclovir (ZOVIRAX) 400 MG tablet Take 400 mg by mouth 2 (two) times daily.  .Marland KitchenamLODipine (NORVASC) 10 MG tablet Take 1 tablet (10 mg total) by mouth daily.  .Marland Kitchenaspirin EC 325 MG EC tablet Take 1 tablet (325 mg total) by mouth daily.  .Marland Kitchenazelastine (ASTELIN) 0.1 % nasal spray Place 2 sprays into both nostrils 2  (two) times daily.   . carvedilol (COREG) 12.5 MG tablet Take 2 tablets (25 mg total) by mouth 2 (two) times daily with a meal.  . clobetasol cream (TEMOVATE) 05.09% Apply 1 application topically 2 (two) times daily. Apply to bilateral lower legs.  . donepezil (ARICEPT) 5 MG tablet Take 1 tablet (5 mg total) by mouth at bedtime.  . furosemide (LASIX) 20 MG tablet Take 1 tablet (20 mg total) by mouth daily.  . insulin glargine (LANTUS) 100 UNIT/ML injection Inject 0.25 mLs (25 Units total) into the skin daily.  .Marland Kitchenipratropium-albuterol (DUONEB) 0.5-2.5 (3) MG/3ML SOLN Take 3 mLs by nebulization every 6 (six) hours.  . metFORMIN (GLUCOPHAGE) 1000 MG tablet Take 1,000 mg by mouth 2 (two) times daily with a meal.  . mupirocin ointment (BACTROBAN) 2 % Apply 1 application topically 2 (two) times daily. To open blisters bilateral legs.  .Marland Kitchenolmesartan (BENICAR) 40 MG tablet Take 1 tablet (40 mg total) by mouth daily.  . primidone (MYSOLINE) 50 MG tablet Take 1 tablet (50 mg total) by mouth 2 (two) times daily. (Patient taking differently: Take 100 mg by mouth 2 (two) times daily.)  . QVAR REDIHALER 40 MCG/ACT inhaler 1 puff 2 (two) times daily.   . rosuvastatin (CRESTOR) 40 MG tablet Take 1 tablet (40 mg total) by mouth at bedtime.  . tamsulosin (FLOMAX) 0.4 MG CAPS capsule Take 0.4 mg by mouth daily. Patient restarted on his own due to trouble urinating  . vitamin B-12 (CYANOCOBALAMIN) 500 MCG tablet Take 2,000 mcg by mouth at bedtime.  . Vitamin D, Cholecalciferol, 25 MCG (1000 UT) CAPS Take 2,000 mcg by mouth at bedtime.   No  facility-administered encounter medications on file as of 04/15/2020.     Current Diagnosis/Assessment:    Goals Addressed   None     Diabetes   Recent Relevant Labs: Lab Results  Component Value Date/Time   HGBA1C 6.9 (H) 03/29/2020 03:46 AM   HGBA1C 7.5 (H) 02/02/2020 04:12 PM   HGBA1C 7.5 (H) 12/15/2019 05:03 AM   HGBA1C 7.7 01/11/2018 12:00 AM   HGBA1C 6.9  02/02/2017 08:23 AM   MICROALBUR 150 (H) 03/28/2019 11:33 AM   MICROALBUR 30 (H) 01/30/2016 08:24 AM     Checking BG: 2x per Day  Recent FBG Readings:149, 160, 180 Recent pre-meal BG readings: unknown Recent 2hr PP BG readings:  140? Recent HS BG readings: unknown Patient has failed these meds in past: Jardiance, Farxiga, Victoza, Trulicity Humalog, Toujeo Patient is currently controlled on the following medications:   Metformin 1000 mg bid  Lantus 20 units ~ once weekly ( if BG>200)  Last diabetic Foot exam:  Lab Results  Component Value Date/Time   HMDIABEYEEXA No Retinopathy 03/05/2016 01:53 PM    Last diabetic Foot  exam:  09/27/19 Griffith  . We discussed: diet and exercise extensively and how to recognize and treat signs of hypoglycemia. Discussed limiting starchy foods, portion control, eating consistent meals with lean proteins and vegetables. He endorses "eating what I want but not as much of it" gives an example of having one hot dog instead of 3 or 4.He complains of spells of "losing my balance and my arm flaps around" he also notes he occasionally has a shooting, tingling feel in his leg. All symptoms are on the right side. He denies, falling, blurred vision, slurred speech or loss of consciousness during these episodes. [facility]. He is still taking only 300mg gabapentin at night.     Plan  Continue current medications  Hypertension   BP goal is:  <130/80  Office blood pressures are  BP Readings from Last 3 Encounters:  04/03/20 137/66  02/29/20 (!) 204/103  02/27/20 138/68   BMP Latest Ref Rng & Units 04/03/2020 04/03/2020 04/02/2020  Glucose 70 - 99 mg/dL 172(H) 163(H) 187(H)  BUN 8 - 23 mg/dL 19 18 15  Creatinine 0.61 - 1.24 mg/dL 0.97 0.81 0.92  BUN/Creat Ratio 10 - 24 - - -  Sodium 135 - 145 mmol/L 135 136 132(L)  Potassium 3.5 - 5.1 mmol/L 4.2 4.1 4.4  Chloride 98 - 111 mmol/L 98 99 96(L)  CO2 22 - 32 mmol/L  27 28 24  Calcium 8.9 - 10.3 mg/dL 9.8 9.9 10.4(H)    Patient checks BP at home not recently. He reports needing a new meter. Patient home BP readings are ranging: Not taking.  Patient has failed these meds in the past: clonidine, olmesartan/amlodipine/HCTZ Patient is currently uncontrolled on the following medications:  . Carvedilol 25 mg bid   Lasix 20 mg qd  Olmesartan 40 mg qd  Amlodipine 10 mg qd   We discussed diet and exercise for DM and HTN including reduced carbohydrates and sodium. Patient needs a new BP Monitor and will request at [facility] Dr. Calloway's note from 10/04/19 indicating reduced carvedilol dose to 12.5 mg bid based on low bp and mild bradycardia. Patient will discuss with [facility] provider tomorrow. Patient has only clonidine and carvedilol. He states his wife has Alzheimers and may have misplaced while he was in the hospital.  Plan    Continue current medications .     Hyperlipidemia/ NSTEMI    LDL goal < 70  Last lipids Lab Results  Component Value Date   CHOL 103 02/02/2020   HDL 26 (L) 02/02/2020   LDLCALC 48 02/02/2020   TRIG 171 (H) 02/02/2020   CHOLHDL 4.5 12/15/2019   Hepatic Function Latest Ref Rng & Units 03/31/2020 03/30/2020 02/02/2020  Total Protein 6.5 - 8.1 g/dL 6.6 6.6 6.8  Albumin 3.5 - 5.0 g/dL 2.8(L) 2.7(L) 3.7  AST 15 - 41 U/L 25 33 15  ALT 0 - 44 U/L 24 20 20  Alk Phosphatase 38 - 126 U/L 49 45 70  Total Bilirubin 0.3 - 1.2 mg/dL 0.7 1.0 0.5  Bilirubin, Direct 0.0 - 0.2 mg/dL - - -     The ASCVD Risk score (Goff DC Jr., et al., 2013) failed to calculate for the following reasons:   The 2013 ASCVD risk score is only valid for ages 40 to 79   The patient has a prior MI or stroke diagnosis   Patient has failed these meds in past: *** Patient is currently {CHL Controlled/Uncontrolled:2109141014} on the following medications:  . ASA EC 325 mg qd  We discussed:  {CHL [facility]  discussion:2109141007}  Plan  Continue {CHL [facility] Pharm[facility] Plans:2109141003}     Medication Management   [facility] all medications Uses pill box? Yes Pt endorses 75% compliance    Plan  Continue current medication management strategy. Will ask patient to bring meds to next visit as it difficult to determine what he actually takes given his extensive provider network.    Follow up: three month phone visit   S.  PharmD, BCPS Clinical Pharmacist [name] [phone]   

## 2020-04-16 ENCOUNTER — Telehealth: Payer: Self-pay | Admitting: Family Medicine

## 2020-04-16 NOTE — Telephone Encounter (Signed)
[place], from [place], calling stating that the pt has been reached out to twice. She states that the pt has had to reschedule twice. She states that the pt complained of being tired and needing to rest until next week. She states that she wanted to make PCP aware. Please advise.    [phone]

## 2020-04-16 NOTE — Telephone Encounter (Signed)
Noted  

## 2020-04-17 ENCOUNTER — Other Ambulatory Visit: Payer: Medicare Other | Admitting: Urology

## 2020-04-17 ENCOUNTER — Encounter: Payer: Self-pay | Admitting: Family Medicine

## 2020-04-17 ENCOUNTER — Ambulatory Visit (INDEPENDENT_AMBULATORY_CARE_PROVIDER_SITE_OTHER): Payer: Medicare Other | Admitting: Family Medicine

## 2020-04-17 ENCOUNTER — Other Ambulatory Visit: Payer: Self-pay

## 2020-04-17 VITALS — BP 162/74 | HR 81 | Temp 97.9°F

## 2020-04-17 DIAGNOSIS — E1159 Type 2 diabetes mellitus with other circulatory complications: Secondary | ICD-10-CM

## 2020-04-17 DIAGNOSIS — N390 Urinary tract infection, site not specified: Secondary | ICD-10-CM | POA: Diagnosis not present

## 2020-04-17 DIAGNOSIS — E785 Hyperlipidemia, unspecified: Secondary | ICD-10-CM

## 2020-04-17 DIAGNOSIS — R319 Hematuria, unspecified: Secondary | ICD-10-CM

## 2020-04-17 DIAGNOSIS — R413 Other amnesia: Secondary | ICD-10-CM

## 2020-04-17 DIAGNOSIS — E1165 Type 2 diabetes mellitus with hyperglycemia: Secondary | ICD-10-CM

## 2020-04-17 DIAGNOSIS — Z794 Long term (current) use of insulin: Secondary | ICD-10-CM | POA: Diagnosis not present

## 2020-04-17 DIAGNOSIS — I152 Hypertension secondary to endocrine disorders: Secondary | ICD-10-CM

## 2020-04-17 DIAGNOSIS — R809 Proteinuria, unspecified: Secondary | ICD-10-CM

## 2020-04-17 DIAGNOSIS — E1129 Type 2 diabetes mellitus with other diabetic kidney complication: Secondary | ICD-10-CM | POA: Diagnosis not present

## 2020-04-17 DIAGNOSIS — I214 Non-ST elevation (NSTEMI) myocardial infarction: Secondary | ICD-10-CM | POA: Diagnosis not present

## 2020-04-17 LAB — GLUCOSE HEMOCUE WAIVED: Glu Hemocue Waived: 175 mg/dL — ABNORMAL HIGH (ref 65–99)

## 2020-04-17 MED ORDER — ROSUVASTATIN CALCIUM 40 MG PO TABS: 40.0000 mg | ORAL_TABLET | Freq: Every day | ORAL | 3 refills | Status: DC

## 2020-04-17 MED ORDER — FUROSEMIDE 20 MG PO TABS: 20.0000 mg | ORAL_TABLET | Freq: Every day | ORAL | 0 refills | Status: DC

## 2020-04-17 MED ORDER — AMLODIPINE BESYLATE 10 MG PO TABS: 10.0000 mg | ORAL_TABLET | Freq: Every day | ORAL | 3 refills | Status: DC

## 2020-04-17 MED ORDER — CLONIDINE HCL 0.1 MG PO TABS: 0.1000 mg | ORAL_TABLET | Freq: Two times a day (BID) | ORAL | 0 refills | Status: DC

## 2020-04-17 NOTE — Progress Notes (Signed)
SUBJECTIVE:   CHIEF COMPLAINT / HPI:   Patient Active Problem List   Diagnosis Date Noted   Hypomagnesemia 03/29/2020   Hypophosphatemia 03/29/2020   Hypertension 03/29/2020   Hyponatremia 03/29/2020   Urinary tract infection 03/29/2020   NSTEMI (non-ST elevated myocardial infarction) ([place]) 03/28/2020   Hypokalemia 03/28/2020   Type 2 diabetes mellitus with hyperglycemia ([place]) 03/28/2020   Leukocytosis 03/28/2020   Class 2 obesity 03/28/2020   Elevated TSH 02/25/2020   Poor mobility 02/02/2020   Microscopic hematuria    Coronary artery disease 01/23/2020   History of CVA (cerebrovascular accident) 01/23/2020   Memory changes 01/01/2020   Syncope 12/15/2019   Elevated troponin 12/14/2019   Pain in both lower extremities 07/14/2019   Peripheral vascular disease with stasis dermatitis 05/23/2019   B12 deficiency 04/23/2019   Constipation 04/21/2019   Tremor 11/14/2018   Morbid obesity ([place]) 09/07/2017   COPD (chronic obstructive pulmonary disease) ([place]) 04/29/2017   Intertrigo 02/02/2017   BPH (benign prostatic hyperplasia) 07/23/2016   Advanced care planning/counseling discussion 62/37/6283   Eosinophilic esophagitis 15/17/6160   PAD (peripheral artery disease) ([place]) 01/08/2016   Gastroesophageal reflux disease without esophagitis 01/06/2016   Hyperlipidemia associated with type 2 diabetes mellitus ([place]) 01/16/2015   Sleep apnea 01/16/2015   BMI 40.0-44.9, adult ([place]) 01/16/2015   Hypertension associated with diabetes ([place]) 10/11/2014   Type 2 diabetes mellitus with proteinuria ([place]) 10/11/2014   Back pain 10/11/2014   History of colon cancer    HOSPITAL FOLLOW UP Time since discharge: 2 weeks Hospital/facility: [facility] Diagnosis: syncope, NSTEMI Procedures/tests:  - ECHO - EF 60-65%, G1DD - CT head NAICA - Carotid US b/l <50% ICA stenosis - UA mod leuks, many bacteria. Urine culture no growth. Consultants: Cardiology New  medications:  - initially on IV heparin. - d/c clonidine - held metformin. Lantus, novolog resumed. - PO keflex x5 days Discharge instructions:   - d/c to SNF, [facility]. Left AMA after ~36hours  - f/u with PCP - f/u with Cards 1 week, hasn't seen yet.  - f/u with Urology Status: dysuria better, BP worse. Has not had additional syncopal episode  HTN - Was supposed to d/c clonidine but instead d/ced coreg. Reports been taking clonidine. Having headaches and higher BP since discharge.  - olmesartan 40mg , amlodipine 10mg , furosemide 20mg , clonidine 0.2mg  BID, coreg 25mg  BID pills with him.  Dysuria - finished keflex. No dysuria, hematuria.   DM - CBGs 160-170s at home. Hasn't taken any insulin since discharge. Taking metformin.    OBJECTIVE:   BP (!) 162/74    Pulse 81    Temp 97.9 F (36.6 C)    SpO2 94%   Gen: obese, elderly Card: RRR, systolic murmur Lungs: CTAB Ext: WWP, no edema  ASSESSMENT/PLAN:   Hypertension associated with diabetes (HCC) Uncontrolled. Initially 190s SBP, improved to 160s on recheck. With headaches and higher BP since discharge, suspect patient has actually not been taking clonidine. Will restart at lower dose, no changes to other BP meds. Medication management has been particularly challenging with patient as likely has underlying dementia, multiple doctors, and is unclear what medications he is supposed to take and what he is actually taking. >50 mins of counseling provided regarding medication management. Advised pill box. F/u in one week for med management and BP check. Emergency precautions reviewed.  NSTEMI (non-ST elevated myocardial infarction) [facility]) Advised follow up with Cardiology, information provided.   Type 2 diabetes mellitus with hyperglycemia (HCC) Reports CBGs within range but without  log. Has not been taking insulin. A1c below goal at 6.9. Will recheck glucose today and have patient continue to hold insulin, continue metformin. F/u in one week  with written CBG log.   Urinary tract infection Resolved s/p abx.  Memory changes Complicating care. Followed by [facility].    Myles Gip, DO

## 2020-04-17 NOTE — Patient Instructions (Addendum)
It was great to see you!  Our plans for today:  - Set up an appointment with [name] soon at [phone] - Continue to take the metformin. Continue to check your sugars and write them down.  - For your blood pressure - Take the olmesartan, amlodipine, furosemide as you have been. Take the carvedilol as directed, one pill twice daily. We are starting a low dose of your clonidine (0.1mg  twice daily). A new prescription is sent to the pharmacy.  - Come back next week for check up. Bring ALL of your medications AND your pill box with you.  - Bring your blood sugar log with you next week.   We are checking some labs today, we will call you with these results.  Take care and seek immediate care sooner if you develop any concerns.   Dr. Ky Barban

## 2020-04-18 LAB — BASIC METABOLIC PANEL
BUN/Creatinine Ratio: 10 (ref 10–24)
BUN: 11 mg/dL (ref 8–27)
CO2: 20 mmol/L (ref 20–29)
Calcium: 10.8 mg/dL — ABNORMAL HIGH (ref 8.6–10.2)
Chloride: 102 mmol/L (ref 96–106)
Creatinine, Ser: 1.08 mg/dL (ref 0.76–1.27)
GFR calc Af Amer: 75 mL/min/{1.73_m2} (ref >59)
GFR calc non Af Amer: 64 mL/min/{1.73_m2} (ref >59)
Glucose: 194 mg/dL — ABNORMAL HIGH (ref 65–99)
Potassium: 4.4 mmol/L (ref 3.5–5.2)
Sodium: 137 mmol/L (ref 134–144)

## 2020-04-18 NOTE — Assessment & Plan Note (Signed)
Resolved s/p abx. °

## 2020-04-18 NOTE — Assessment & Plan Note (Signed)
Advised follow up with Cardiology, information provided.

## 2020-04-18 NOTE — Assessment & Plan Note (Signed)
Complicating care. Followed by [facility].

## 2020-04-18 NOTE — Assessment & Plan Note (Addendum)
Reports CBGs within range but without log. Has not been taking insulin. A1c below goal at 6.9. Will recheck glucose today and have patient continue to hold insulin, continue metformin. F/u in one week with written CBG log.

## 2020-04-18 NOTE — Assessment & Plan Note (Signed)
Uncontrolled. Initially 190s SBP, improved to 160s on recheck. With headaches and higher BP since discharge, suspect patient has actually not been taking clonidine. Will restart at lower dose, no changes to other BP meds. Medication management has been particularly challenging with patient as likely has underlying dementia, multiple doctors, and is unclear what medications he is supposed to take and what he is actually taking. >50 mins of counseling provided regarding medication management. Advised pill box. F/u in one week for med management and BP check. Emergency precautions reviewed.

## 2020-04-24 ENCOUNTER — Ambulatory Visit (INDEPENDENT_AMBULATORY_CARE_PROVIDER_SITE_OTHER): Payer: Medicare Other | Admitting: Family Medicine

## 2020-04-24 ENCOUNTER — Encounter: Payer: Self-pay | Admitting: Family Medicine

## 2020-04-24 ENCOUNTER — Other Ambulatory Visit: Payer: Self-pay

## 2020-04-24 VITALS — BP 160/80 | HR 55 | Temp 97.4°F

## 2020-04-24 DIAGNOSIS — R413 Other amnesia: Secondary | ICD-10-CM

## 2020-04-24 DIAGNOSIS — I10 Essential (primary) hypertension: Secondary | ICD-10-CM

## 2020-04-24 DIAGNOSIS — R809 Proteinuria, unspecified: Secondary | ICD-10-CM | POA: Diagnosis not present

## 2020-04-24 DIAGNOSIS — I214 Non-ST elevation (NSTEMI) myocardial infarction: Secondary | ICD-10-CM | POA: Diagnosis not present

## 2020-04-24 DIAGNOSIS — F0390 Unspecified dementia without behavioral disturbance: Secondary | ICD-10-CM

## 2020-04-24 DIAGNOSIS — E1129 Type 2 diabetes mellitus with other diabetic kidney complication: Secondary | ICD-10-CM

## 2020-04-24 DIAGNOSIS — J449 Chronic obstructive pulmonary disease, unspecified: Secondary | ICD-10-CM

## 2020-04-24 NOTE — Progress Notes (Signed)
   SUBJECTIVE:   CHIEF COMPLAINT / HPI:   Patient Active Problem List   Diagnosis Date Noted  . Hypomagnesemia 03/29/2020  . Hypophosphatemia 03/29/2020  . Hypertension 03/29/2020  . Hyponatremia 03/29/2020  . Urinary tract infection 03/29/2020  . NSTEMI (non-ST elevated myocardial infarction) ([place]) 03/28/2020  . Hypokalemia 03/28/2020  . Type 2 diabetes mellitus with hyperglycemia ([place]) 03/28/2020  . Leukocytosis 03/28/2020  . Class 2 obesity 03/28/2020  . Elevated TSH 02/25/2020  . Poor mobility 02/02/2020  . Microscopic hematuria   . Coronary artery disease 01/23/2020  . History of CVA (cerebrovascular accident) 01/23/2020  . Memory changes 01/01/2020  . Syncope 12/15/2019  . Elevated troponin 12/14/2019  . Pain in both lower extremities 07/14/2019  . Peripheral vascular disease with stasis dermatitis 05/23/2019  . B12 deficiency 04/23/2019  . Constipation 04/21/2019  . Tremor 11/14/2018  . Morbid obesity ([place]) 09/07/2017  . COPD (chronic obstructive pulmonary disease) ([place]) 04/29/2017  . Intertrigo 02/02/2017  . BPH (benign prostatic hyperplasia) 07/23/2016  . Advanced care planning/counseling discussion 07/23/2016  . Eosinophilic esophagitis 45/40/9811  . PAD (peripheral artery disease) ([place]) 01/08/2016  . Gastroesophageal reflux disease without esophagitis 01/06/2016  . Hyperlipidemia associated with type 2 diabetes mellitus ([place]) 01/16/2015  . Sleep apnea 01/16/2015  . BMI 40.0-44.9, adult ([place]) 01/16/2015  . Hypertension associated with diabetes ([place]) 10/11/2014  . Type 2 diabetes mellitus with proteinuria ([place]) 10/11/2014  . Back pain 10/11/2014  . History of colon cancer    Hypertension: - seen previously 1/26 for hosp f/u with much confusion regarding medications.  - Medications: olmesartan 40mg , amlodipine 10mg , furosemide 20mg , clonidine 0.1mg  BID, coreg 25mg  BID - Compliance: reports daily compliance - Checking BP at home: no - Denies any SOB, CP, LE  edema, medication SEs - still with headache. Hasn't taken anything for it.  - not using CPAP. Waiting on new machine from [name].  - has not yet had f/u with Cards. - not using pill box.  OBJECTIVE:   BP (!) 160/80   Pulse (!) 55   Temp (!) 97.4 F (36.3 C)   SpO2 97%   Gen: elderly, chronically ill appearing Card: RRR Lungs: CTAB Ext: WWP, no edema  ASSESSMENT/PLAN:   Hypertension Uncontrolled and likely related to poor medication compliance 2/2 underlying dementia. Reviewed all medications brought by patient with some bubble packs not used since last visit last week. Not using pill box as directed at visit last week. Personally counted pills in medication bottles with 01/2020 fill date with several missed doses of multiple medications. Provided tylenol and 1 pill of 0.1mg  clonidine in office today with improvement in BP from 160 SBP to 140 SBP. Already referred to chronic care management. Will place [facility] referral today for home medication management and send message to case manage for assistance at getting Cardiology hospital f/u appointment rescheduled. Much counseling again provided today on use of pill box. F/u in 2 weeks.  Memory changes Complicating care and with lack of proper social support. Followed by chronic care management. Referral placed today for [facility] med management.    Myles Gip, DO

## 2020-04-24 NOTE — Patient Instructions (Addendum)
It was great to see you!  Our plans for today:  - Use a pill box for all of your pills. Use only this for taking your medications.  - I have sent a referral for home health to help you with your medications.  - I have sent a message to our case manager to get you help with scheduling your Cardiology appointment.  - Follow up in 2 weeks.  Take care and seek immediate care sooner if you develop any concerns.   Dr. Ky Barban

## 2020-04-24 NOTE — Assessment & Plan Note (Signed)
Uncontrolled and likely related to poor medication compliance 2/2 underlying dementia. Reviewed all medications brought by patient with some bubble packs not used since last visit last week. Not using pill box as directed at visit last week. Personally counted pills in medication bottles with 01/2020 fill date with several missed doses of multiple medications. Provided tylenol and 1 pill of 0.1mg  clonidine in office today with improvement in BP from 160 SBP to 140 SBP. Already referred to chronic care management. Will place [facility] referral today for home medication management and send message to case manage for assistance at getting Cardiology hospital f/u appointment rescheduled. Much counseling again provided today on use of pill box. F/u in 2 weeks.

## 2020-04-24 NOTE — Assessment & Plan Note (Signed)
Complicating care and with lack of proper social support. Followed by chronic care management. Referral placed today for [facility] med management.

## 2020-04-26 ENCOUNTER — Ambulatory Visit (INDEPENDENT_AMBULATORY_CARE_PROVIDER_SITE_OTHER): Payer: Medicare Other | Admitting: General Practice

## 2020-04-26 DIAGNOSIS — I2510 Atherosclerotic heart disease of native coronary artery without angina pectoris: Secondary | ICD-10-CM

## 2020-04-26 DIAGNOSIS — E785 Hyperlipidemia, unspecified: Secondary | ICD-10-CM

## 2020-04-26 DIAGNOSIS — I214 Non-ST elevation (NSTEMI) myocardial infarction: Secondary | ICD-10-CM

## 2020-04-26 DIAGNOSIS — R809 Proteinuria, unspecified: Secondary | ICD-10-CM

## 2020-04-26 DIAGNOSIS — E1129 Type 2 diabetes mellitus with other diabetic kidney complication: Secondary | ICD-10-CM

## 2020-04-26 DIAGNOSIS — I10 Essential (primary) hypertension: Secondary | ICD-10-CM

## 2020-04-26 DIAGNOSIS — R413 Other amnesia: Secondary | ICD-10-CM

## 2020-04-26 DIAGNOSIS — R296 Repeated falls: Secondary | ICD-10-CM

## 2020-04-26 NOTE — Patient Instructions (Signed)
Visit Information  PATIENT GOALS: Goals Addressed              This Visit's Progress   .  RNCM: Improve My Heart Health-Coronary Artery Disease        Timeframe:  Short-Term Goal Priority:  High Start Date:                             Expected End Date:     07-20-2020                  Follow Up Date 05/17/2020   - be open to making changes - I can manage, know and watch for signs of a heart attack - if I have chest pain, call for help - learn about small changes that will make a big difference - learn my personal risk factors    Why is this important?    Lifestyle changes are key to improving the blood flow to your heart. Think about the things you can change and set a goal to live healthy.   Remember, when the blood vessels to your heart start to get clogged you may not have any symptoms.   Over time, they can get worse.   Don't ignore the signs, like chest pain, and get help right away.     Notes: recent hospitalization for NSTEMI    .  RNCM: Monitor and Manage My Blood Sugar-Diabetes Type 2        Timeframe:  Short-Term Goal Priority:  High Start Date:                             Expected End Date:      07-20-2020                 Follow Up Date 05-17-2020   - check blood sugar at prescribed times - check blood sugar before and after exercise - check blood sugar if I feel it is too high or too low - enter blood sugar readings and medication or insulin into daily log - take the blood sugar log to all doctor visits    Why is this important?    Checking your blood sugar at home helps to keep it from getting very high or very low.   Writing the results in a diary or log helps the doctor know how to care for you.   Your blood sugar log should have the time, date and the results.   Also, write down the amount of insulin or other medicine that you take.   Other information, like what you ate, exercise done and how you were feeling, will also be helpful.     Notes:  Patient not consistently checking blood sugars     .  COMPLETED: RNCM: Pt- " I am concerned about the swelling in my legs" (pt-stated)        CARE PLAN ENTRY (see longtitudinal plan of care for additional care plan information)  Current Barriers: Closing this goal and opening in new ELS . Chronic Disease Management support, education, and care coordination needs related to HTN, HLD, COPD, and DMII  Clinical Goal(s) related to HTN, HLD, COPD, and DMII:  Over the next 120 days, patient will:  . Work with the care management team to address educational, disease management, and care coordination needs  . Begin or continue self health monitoring activities as  directed today Measure and record cbg (blood glucose) 3-4 times daily and Measure and record blood pressure 3 times per week . Call provider office for new or worsened signs and symptoms Blood glucose findings outside established parameters, Blood pressure findings outside established parameters, Weight outside established parameters, Oxygen saturation lower than established parameter, Shortness of breath, and New or worsened symptom related to Swelling in legs, and other changes in condition  . Call care management team with questions or concerns . Verbalize basic understanding of patient centered plan of care established today  Interventions related to HTN, HLD, COPD, and DMII:  . Evaluation of current treatment plans and patient's adherence to plan as established by provider.  Per the patient he has lost down to 302.  He has cut out ice cream and potato chips and eating one "good meal" a day. This is usually at lunch or supper.  The patient states that he has decreased his Lantus from 80 units to 50 units because the Lantus at 80 units was too much. Blood sugars are running 100-140. In the office today the patient states he has gain some weight back because of the holidays but he is back on his regimen and it will come back off. 02-02-2020: The  patient states: "I don't feel good at all".  The patient states he does not have an appetite, he was tested in the hospital for [place] and he does not have. They told him he had an infection but could not tell him where. He does nothing but fall, pee, and can't sleep.  The patient says he feels no better than he was when he went to the hospital.  He is frustrated and upset.  Has post hospital follow up with Marnee Guarneri, NP today at 4 pm.  Encouraged the patient to write down any questions or concerns he had and discuss these with Jolene today at his appointment.  . Assessed patient understanding of disease states-patient is ready to make positive changes to improve his health and well being. The patient has made positive lifestyle changes to help his health and well being. Praised for weight loss and changing his dietary habits. 02-02-2020: The patient post discharge form the hospital. The patient is frustrated and wants answers. He feels like everything with his health is messed up. Empathetic listening.  Encouraged the patient to bring his discharge paperwork with him to his appointment today and write down andy questions or concerns he has.  . Assessed patient's education and care coordination needs-the patient sees several different specialist and also the [name]. The patient has several healthcare practices he does at home. Education and support given.  . Evaluation of next appointment with pcp is on 02-02-2020 at 4 pm.  The patient states he has several things he wants to discuss with the pcp.  . Evaluation of swelling in bilateral legs and feet. The patient verbalized that his swelling has gone down in his feet and legs considerably. He still has more in his left leg/foot  than right leg/foot. The patient has been unsuccessful at finding ted hose that work for him. Research done and there are 3XL size available for purchase. The RNCM will  have a pair of 3XL [place] Anti-embolism cap knee high inspection  toe stockings 18 mmhg- white for the patient to try for help with reducing swelling further in his legs. Information for purchase of ted hose if this works for the patient is: http://young.com/. SKU#561- $4.39 a pair- does not include  shipping and handling. Will have this information for the patient if he chooses to order more of the ted stockings. Saw the patient face to face in the office today. Took the ted hose to the patient. The patient feels these will work well for his legs. The patients legs have decreased swelling.  The left is more swollen than the right. The right appears to be normal and back to baseline. 02-02-2020: The patient states the swelling is back in his legs and he does not remember anything about the compression stockings given to him by the [facility] to [place] for help with swelling. The patient states all he does is pee and go to the bathroom.  He states he has noticed blood in his urine and his private parts hurt. He verbalized his pee is a "burnt orange" in color.  . Provided disease specific education to patient-Heart Healty/ADA diet.  The patient has had significant weight loss. Praised for positive dietary changes to promote health and well being. 02-02-2020: The patient states that he does not have an appetite and nothing taste good to him. He is very frustrated with all the health issues he has going on at this time. He states he only wants answers. Nash Dimmer with appropriate clinical care team members regarding patient needs.  Currently working with the CCM pharmacist. Will collaborate with the pcp and pharmacist concerning patients confusion with recent medication changes. The patient verbalized understanding at the end of the call but would like team support as the patient easily gets confused with his medications and how to take them. This is an ongoing issue and concern.  . Evaluation of new complaint of "shaking and losing balance".  The patient verbalized he shakes so much he  can hardly sign his name. He said when he is standing he is losing his balance and his wife even said he looks like he is "swaying".  The patient states he does not know what to do about it. The patient also says that his right leg at the ankle sometimes has a shooting sensation in it and it is "terrible".  Says he can not tell that the gabapentin is helping. The patient did say he has not increased the dose to 2. Told the patient to try this as directed by [facility] and see if this helped. Did tell the patient RNCM would collaborate with the pcp and let her know of new findings. The patient has had new falls and the patient is requesting a walker. Have sent and in basket message to the pcp about getting a new walker (new goal opened for falls) . Assessed SDOH. Ask the patient about how he was going to get to his appointment today. The patient states his wife is driving him. He had some issues with mini strokes in September and has been advised not to drive for at least 6 months. Education and support given.   Patient Self Care Activities related to HTN, HLD, COPD, and DMII:  . Patient is unable to independently self-manage chronic health conditions  Please see past updates related to this goal by clicking on the "Past Updates" button in the selected goal      .  COMPLETED: RNCM: Pt-"My leg hurts and I am having a hard time walking" (pt-stated)        CARE PLAN ENTRY (see longitudinal plan of care for additional care plan information)  Current Barriers: Closing and opening in new ELS . Knowledge Deficits related to fall precautions  in patient with  . Decreased adherence to prescribed treatment for fall prevention . Knowledge Deficits related to changes in health and inability to ambulate as a result of leg pain and discomfort and change in balance . Care Coordination needs related to the need for a walker to use in the home setting in a patient with new frequent falls and the need for DME . Chronic  Disease Management support and education needs related to recent CVA with hospitalizations and new concerns with safety . Lacks caregiver support- wife has dementia but the patient states she can drive him to appointments.   . Non-adherence to prescribed medication regimen . Cognitive Deficits  Clinical Goal(s):  Marland Kitchen Over the next 120 days, patient will demonstrate improved adherence to prescribed treatment plan for decreasing falls as evidenced by patient reporting and review of EMR . Over the next 120 days, patient will verbalize using fall risk reduction strategies discussed . Over the next 120 days, patient will not experience additional falls . Over the next 120 days, patient will verbalize understanding of plan for fall reduction and utilization of DME in the home  . Over the next 120 days, patient will not experience hospital admission. Hospital Admissions in last 6 months = 2 admits in the last 6 months. . Over the next 120 days, patient will demonstrate a decrease in fall events exacerbations as evidenced by finding etiology of increased leg pain and changes in balance contributing to increased fall risk.  . Over the next 120 days, patient will attend all scheduled medical appointments: pcp appointment today at 4 pm . Over the next 120 days, the patient will demonstrate ongoing self health care management ability as evidenced by no new falls.  Interventions:  . Provided written and verbal education re: Potential causes of falls and Fall prevention strategies . Reviewed medications and discussed potential side effects of medications such as dizziness and frequent urination . Assessed for s/s of orthostatic hypotension . Assessed for falls since last encounter. . Assessed patients knowledge of fall risk prevention secondary to previously provided education. . Assessed working status of life alert bracelet and patient adherence . Provided patient information for fall alert  systems . Evaluation of current treatment plan related to falls  and patient's adherence to plan as established by provider. . Advised patient to notify the provider of new falls when they occur . Provided education to patient re: safety precautions and preventing new falls . Collaborated with pcp regarding the need for a walker for the patient to use in the home setting . Discussed plans with patient for ongoing care management follow up and provided patient with direct contact information for care management team . Provided patient with safety educational materials related to increased falls, including hospitization  . Reviewed scheduled/upcoming provider appointments including: 02-02-2020 at 4 pm  Patient Self Care Activities:  . Utilize walker(assistive device) appropriately with all ambulation . De-clutter walkways . Change positions slowly . Wear secure fitting shoes at all times with ambulation . Utilize home lighting for dim lit areas . Have self and pet awareness at all times  Plan: . CCM RN CM will follow up in 30 to 60   Initial goal documentation        The patient verbalized understanding of instructions, educational materials, and care plan provided today and declined offer to receive copy of patient instructions, educational materials, and care plan.   Telephone follow up appointment with care management team member scheduled for:05-17-2020 at  3:30 pm  Noreene Larsson RN, MSN, [facility] Mobile: [phone]

## 2020-04-26 NOTE — Chronic Care Management (AMB) (Signed)
Chronic Care Management   CCM RN Visit Note  04/26/2020 Name: Justin Griffith MRN: [id number] DOB: 01-08-1941  Subjective: Justin Griffith is a 80 y.o. year old male who is a primary care patient of Vigg, Avanti, MD. The care management team was consulted for assistance with disease management and care coordination needs.    Engaged with patient by telephone for follow up visit in response to provider referral for case management and/or care coordination services.   Consent to Services:  The patient was given information about Chronic Care Management services, agreed to services, and gave verbal consent prior to initiation of services.  Please see initial visit note for detailed documentation.   Patient agreed to services and verbal consent obtained.   Assessment: Review of patient past medical history, allergies, medications, health status, including review of consultants reports, laboratory and other test data, was performed as part of comprehensive evaluation and provision of chronic care management services.   SDOH (Social Determinants of Health) assessments and interventions performed:    CCM Care Plan  Allergies  Allergen Reactions   Farxiga [Dapagliflozin] Rash   Jardiance [Empagliflozin] Rash    Outpatient Encounter Medications as of 04/26/2020  Medication Sig   acyclovir (ZOVIRAX) 400 MG tablet Take 400 mg by mouth 2 (two) times daily.   amLODipine (NORVASC) 10 MG tablet Take 1 tablet (10 mg total) by mouth daily.   aspirin EC 325 MG EC tablet Take 1 tablet (325 mg total) by mouth daily. (Patient not taking: Reported on 04/17/2020)   azelastine (ASTELIN) 0.1 % nasal spray Place 2 sprays into both nostrils 2 (two) times daily.  (Patient not taking: Reported on 04/17/2020)   carvedilol (COREG) 12.5 MG tablet Take 2 tablets (25 mg total) by mouth 2 (two) times daily with a meal. (Patient not taking: Reported on 04/17/2020)   clobetasol cream (TEMOVATE) 7.56 % Apply 1  application topically 2 (two) times daily. Apply to bilateral lower legs. (Patient not taking: Reported on 04/17/2020)   cloNIDine (CATAPRES) 0.1 MG tablet Take 1 tablet (0.1 mg total) by mouth 2 (two) times daily.   donepezil (ARICEPT) 5 MG tablet Take 1 tablet (5 mg total) by mouth at bedtime.   furosemide (LASIX) 20 MG tablet Take 1 tablet (20 mg total) by mouth daily.   insulin glargine (LANTUS) 100 UNIT/ML injection Inject 0.25 mLs (25 Units total) into the skin daily.   ipratropium-albuterol (DUONEB) 0.5-2.5 (3) MG/3ML SOLN Take 3 mLs by nebulization every 6 (six) hours.   metFORMIN (GLUCOPHAGE) 1000 MG tablet Take 1,000 mg by mouth 2 (two) times daily with a meal.   mupirocin ointment (BACTROBAN) 2 % Apply 1 application topically 2 (two) times daily. To open blisters bilateral legs.   olmesartan (BENICAR) 40 MG tablet Take 1 tablet (40 mg total) by mouth daily.   primidone (MYSOLINE) 50 MG tablet Take 1 tablet (50 mg total) by mouth 2 (two) times daily. (Patient taking differently: Take 100 mg by mouth 2 (two) times daily.)   QVAR REDIHALER 40 MCG/ACT inhaler 1 puff 2 (two) times daily.  (Patient not taking: Reported on 04/17/2020)   rosuvastatin (CRESTOR) 40 MG tablet Take 1 tablet (40 mg total) by mouth at bedtime.   tamsulosin (FLOMAX) 0.4 MG CAPS capsule Take 0.4 mg by mouth daily. Patient restarted on his own due to trouble urinating   vitamin B-12 (CYANOCOBALAMIN) 500 MCG tablet Take 2,000 mcg by mouth at bedtime.   Vitamin D, Cholecalciferol, 25 MCG (1000 UT)  CAPS Take 2,000 mcg by mouth at bedtime.   No facility-administered encounter medications on file as of 04/26/2020.    Patient Active Problem List   Diagnosis Date Noted   Hypomagnesemia 03/29/2020   Hypophosphatemia 03/29/2020   Hypertension 03/29/2020   Hyponatremia 03/29/2020   Urinary tract infection 03/29/2020   NSTEMI (non-ST elevated myocardial infarction) ([place]) 03/28/2020   Hypokalemia 03/28/2020    Type 2 diabetes mellitus with hyperglycemia ([place]) 03/28/2020   Leukocytosis 03/28/2020   Class 2 obesity 03/28/2020   Elevated TSH 02/25/2020   Poor mobility 02/02/2020   Microscopic hematuria    Coronary artery disease 01/23/2020   History of CVA (cerebrovascular accident) 01/23/2020   Memory changes 01/01/2020   Syncope 12/15/2019   Elevated troponin 12/14/2019   Pain in both lower extremities 07/14/2019   Peripheral vascular disease with stasis dermatitis 05/23/2019   B12 deficiency 04/23/2019   Constipation 04/21/2019   Tremor 11/14/2018   Morbid obesity ([place]) 09/07/2017   COPD (chronic obstructive pulmonary disease) ([place]) 04/29/2017   Intertrigo 02/02/2017   BPH (benign prostatic hyperplasia) 07/23/2016   Advanced care planning/counseling discussion 39/76/7341   Eosinophilic esophagitis 93/79/0240   PAD (peripheral artery disease) ([place]) 01/08/2016   Gastroesophageal reflux disease without esophagitis 01/06/2016   Hyperlipidemia associated with type 2 diabetes mellitus ([place]) 01/16/2015   Sleep apnea 01/16/2015   BMI 40.0-44.9, adult ([place]) 01/16/2015   Hypertension associated with diabetes ([place]) 10/11/2014   Type 2 diabetes mellitus with proteinuria ([place]) 10/11/2014   Back pain 10/11/2014   History of colon cancer     Conditions to be addressed/monitored:CAD, HTN, HLD, DMII and NSTEMI, falls, and memory changes   Care Plan : RNCM: Fall Risk (Adult)  Updates made by Vanita Ingles since 04/26/2020 12:00 AM    Problem: RNCM: Fall Risk   Priority: High    Long-Range Goal: RNCM: Absence of Fall and Fall-Related Injury   Priority: High  Note:   Current Barriers:   Knowledge Deficits related to fall precautions in patient with memory changes, frequent falls, recurrent hospitalization, non compliance with medication regimen, and multiple chronic conditions impacting his care.   Decreased adherence to prescribed treatment for fall  prevention  Unable to independently manage his safety in the home as evidence by noncompliance and frequent falls   Unable to self administer medications as prescribed  Does not adhere to provider recommendations re: medication as prescribed, keeping appointments   Does not attend all scheduled provider appointments  Does not adhere to prescribed medication regimen  Lacks social connections  Unable to perform IADLs independently  Does not maintain contact with provider office  Does not contact provider office for questions/concerns  Lacks caregiver support.   Non-adherence to scheduled provider appointments  Non-adherence to prescribed medication regimen  Cognitive Deficits Clinical Goal(s):   Over the next 120 days, patient will demonstrate improved adherence to prescribed treatment plan for decreasing falls as evidenced by patient reporting and review of EMR  Over the next 120 days, patient will verbalize using fall risk reduction strategies discussed  Over the next 120 days, patient will not experience additional falls  Over the next 120 days, patient will verbalize understanding of plan for safety in the home and preventing falls   Over the next 120 days, patient will work with RNCM, pcp and CCM team  to address needs related to falls and preventing injury in the home   Over the next 120 days, patient will attend all scheduled medical appointments: 05-09-2020  Over the next 120 days, patient will demonstrate improved health management independence as evidenced bycompliance with recommendations by the provider and no new falls in the home Interventions:   Collaboration with Charlynne Cousins, MD regarding development and update of comprehensive plan of care as evidenced by provider attestation and co-signature  Inter-disciplinary care team collaboration (see longitudinal plan of care)  Provided written and verbal education re: Potential causes of falls and Fall prevention  strategies  Reviewed medications and discussed potential side effects of medications such as dizziness and frequent urination  Assessed for s/s of orthostatic hypotension  Assessed for falls since last encounter.  Assessed patients knowledge of fall risk prevention secondary to previously provided education.  Assessed working status of life alert bracelet and patient adherence  Provided patient information for fall alert systems  Evaluation of current treatment plan related to fall prevention  and patient's adherence to plan as established by provider.  Advised patient to call the office for new falls or changes in mobility  Provided education to patient re: safety in the home and use of DME to prevent falls   Discussed plans with patient for ongoing care management follow up and provided patient with direct contact information for care management team Patient Goals/Self-Care Activities  Over the next 120 days, patient will:   - Utilize walker or cane (assistive device) appropriately with all ambulation - De-clutter walkways - Change positions slowly - Wear secure fitting shoes at all times with ambulation - Utilize home lighting for dim lit areas - Demonstrate self and pet awareness at all times - activities of daily living skills assessed - assistive or adaptive device use encouraged - barriers to physical activity or exercise addressed - barriers to physical activity or exercise identified - barriers to safety identified - cognition assessed - cognitive-stimulating activities promoted - fall prevention plan reviewed and updated - fear of falling, loss of independence and pain acknowledged - medication list reviewed - modification of home and work environment promoted - participation in rehabilitation therapy encouraged Follow Up Plan: Telephone follow up appointment with care management team member scheduled for: 05-17-2020 at 3:30 pm   Task: RNCM: Identify and Manage  Contributors to Fall Risk   Note:   Care Management Activities:    - activities of daily living skills assessed - assistive or adaptive device use encouraged - barriers to physical activity or exercise addressed - barriers to physical activity or exercise identified - barriers to safety identified - cognition assessed - cognitive-stimulating activities promoted - fall prevention plan reviewed and updated - fear of falling, loss of independence and pain acknowledged - medication list reviewed - modification of home and work environment promoted - participation in rehabilitation therapy encouraged       Care Plan : RNCM: Hypertension (Adult)  Updates made by Vanita Ingles since 04/26/2020 12:00 AM    Problem: RNCM: Hypertension (Hypertension)   Priority: Medium    Long-Range Goal: RNCM: Hypertension Monitored   Priority: Medium  Note:   Objective:   Last practice recorded BP readings:  BP Readings from Last 3 Encounters:  04/24/20 (!) 160/80  04/17/20 (!) 162/74  04/03/20 137/66     Most recent eGFR/CrCl: No results found for: EGFR  No components found for: CRCL Current Barriers:   Knowledge Deficits related to basic understanding of hypertension pathophysiology and self care management  Knowledge Deficits related to understanding of medications prescribed for management of hypertension  Non-adherence to prescribed medication regimen  Non-adherence to scheduled  provider appointments  Cognitive Deficits  Limited Social Support  Unable to independently manage HTN  Unable to self administer medications as prescribed  Does not adhere to provider recommendations re: dietary restrictions, following up with specialist, non-compliance with regimen  Does not attend all scheduled provider appointments  Does not adhere to prescribed medication regimen  Lacks social connections  Unable to perform IADLs independently  Does not maintain contact with provider  office  Does not contact provider office for questions/concerns Case Manager Clinical Goal(s):   Over the next 120 days, patient will verbalize understanding of plan for hypertension management  Over the next 120 days, patient will not experience hospital admission. Hospital Admissions in last 6 months = 3  Over the next 120 days, patient will attend all scheduled medical appointments: 05-08-2020 with pcp and 05-09-2020 with cardiologist   Over the next 120 days, patient will demonstrate improved adherence to prescribed treatment plan for hypertension as evidenced by taking all medications as prescribed, monitoring and recording blood pressure as directed, adhering to low sodium/DASH diet  Over the next 120 days, patient will demonstrate improved health management independence as evidenced by checking blood pressure as directed and notifying PCP if SBP>160 or DBP > 90, taking all medications as prescribe, and adhering to a low sodium diet as discussed.  Over the next 120 days, patient will verbalize basic understanding of hypertension disease process and self health management plan as evidenced by normalized blood pressures, compliance with medications, diet, and MD recommendations.  Interventions:   Collaboration with Charlynne Cousins, MD regarding development and update of comprehensive plan of care as evidenced by provider attestation and co-signature  Inter-disciplinary care team collaboration (see longitudinal plan of care)  UNABLE to independently:manage HTN  Evaluation of current treatment plan related to hypertension self management and patient's adherence to plan as established by provider.  Provided education to patient re: stroke prevention, s/s of heart attack and stroke, DASH diet, complications of uncontrolled blood pressure  Reviewed medications with patient and discussed importance of compliance  Discussed plans with patient for ongoing care management follow up and provided  patient with direct contact information for care management team  Advised patient, providing education and rationale, to monitor blood pressure daily and record, calling PCP for findings outside established parameters.   Reviewed scheduled/upcoming provider appointments including: 05-08-2020 with pcp, 05-09-2020 with cardiology   Called Dr. Etta Quill office and spoke to staff, confirmed the patient has an appointment at 10 am on 05-09-2020. Made a second call to the patient and advised the patient of his appointment as he could not find his calendar on the first call. Expressed concern over the patient missing appointments and the importance of keeping appointment with cardiology on 05-09-2020 at 10 am. The patient verbalized understanding and states he has found his calendar and he has it on his calendar and will go to his appointment.  Patient Goals/Self-Care Activities  Over the next 120 days, patient will:  - UNABLE to independently manage HTN Self administers medications as prescribed Attends all scheduled provider appointments Calls provider office for new concerns, questions, or BP outside discussed parameters Checks BP and records as discussed Follows a low sodium diet/DASH diet - blood pressure trends reviewed - depression screen reviewed - home or ambulatory blood pressure monitoring encouraged Follow Up Plan: Telephone follow up appointment with care management team member scheduled for: 05-17-2020 at 3:30 pm   Task: RNCM: Identify and Monitor Blood Pressure Elevation   Note:   Care  Management Activities:    - blood pressure trends reviewed - depression screen reviewed - home or ambulatory blood pressure monitoring encouraged     Care Plan : RNCM: Diabetes Type 2 (Adult)  Updates made by Vanita Ingles since 04/26/2020 12:00 AM    Problem: RNCM: Glycemic Management (Diabetes, Type 2)   Priority: Medium    Long-Range Goal: RNCM" Glycemic Management Optimized   Priority:  Medium  Note:   Objective:  Lab Results  Component Value Date   HGBA1C 6.9 (H) 03/29/2020    Lab Results  Component Value Date   CREATININE 1.08 04/17/2020   CREATININE 0.97 04/03/2020   CREATININE 0.81 04/03/2020     No results found for: EGFR Current Barriers:   Knowledge Deficits related to basic Diabetes pathophysiology and self care/management  Knowledge Deficits related to medications used for management of diabetes  Cognitive Deficits  Knowledge Deficits related to self administration of insulin  Does not use cbg meter   Limited Social Support  Unable to independently DM  Unable to self administer medications as prescribed  Does not adhere to provider recommendations re: dietary restrictions, medication management, cbg monitoring   Does not attend all scheduled provider appointments  Does not adhere to prescribed medication regimen  Lacks social connections  Unable to perform IADLs independently  Does not maintain contact with provider office  Does not contact provider office for questions/concerns  [facility] Case Manager Clinical Goal(s):   patient will demonstrate improved adherence to prescribed treatment plan for diabetes self care/management as evidenced by: daily monitoring and recording of CBG  adherence to ADA/ carb modified diet adherence to prescribed medication regimen Interventions:   Collaboration with Vigg, Avanti, MD regarding development and update of comprehensive plan of care as evidenced by provider attestation and co-signature  Inter-disciplinary care team collaboration (see longitudinal plan of care)  Provided education to patient about basic DM disease process  Reviewed medications with patient and discussed importance of medication adherence  Discussed plans with patient for ongoing care management follow up and provided patient with direct contact information for care management team  Provided patient with  written educational materials related to hypo and hyperglycemia and importance of correct treatment  Reviewed scheduled/upcoming provider appointments including: 05-08-2020 with pcp  Advised patient, providing education and rationale, to check cbg BID and record, calling pcp for findings outside established parameters.    Review of patient status, including review of consultants reports, relevant laboratory and other test results, and medications completed. Patient Goals/Self-Care Activities  Over the next 120 days, patient will:  - UNABLE to independently manage DM Self administers oral medications as prescribed Self administers insulin as prescribed Attends all scheduled provider appointments Checks blood sugars as prescribed and utilize hyper and hypoglycemia protocol as needed Adheres to prescribed ADA/carb modified - barriers to adherence to treatment plan identified - blood glucose monitoring encouraged - blood glucose readings reviewed - resources required to improve adherence to care identified - self-awareness of signs/symptoms of hypo or hyperglycemia encouraged - use of blood glucose monitoring log promoted  Follow Up Plan: Telephone follow up appointment with care management team member scheduled for: 05-17-2020 at 3:30 pm   Task: RNCM: Alleviate Barriers to Glycemic Management   Note:   Care Management Activities:    - barriers to adherence to treatment plan identified - blood glucose monitoring encouraged - blood glucose readings reviewed - resources required to improve adherence to care identified - self-awareness of signs/symptoms of hypo  or hyperglycemia encouraged - use of blood glucose monitoring log promoted       Care Plan : RNCM: Coronary Artery Disease (Adult)  Updates made by Vanita Ingles since 04/26/2020 12:00 AM    Problem: RNCM: Disease Progression (Coronary Artery Disease), HLD, NSTEMI   Priority: High    Goal: RNCM: Disease Progression Prevented  or Minimized   Priority: High  Note:   Current Barriers:   Poorly controlled hyperlipidemia, complicated by non compliance, frequent hospitalization, memory changes, not following plan of care  Current antihyperlipidemic regimen: Rosuvastatin 40 mg QD  Most recent lipid panel:     Component Value Date/Time   CHOL 103 02/02/2020 1646   CHOL 89 02/02/2017 0823   TRIG 171 (H) 02/02/2020 1646   TRIG 262 (H) 02/02/2017 0823   HDL 26 (L) 02/02/2020 1646   CHOLHDL 4.5 12/15/2019 0503   VLDL 45 (H) 12/15/2019 0503   VLDL 52 (H) 02/02/2017 0823   LDLCALC 48 02/02/2020 1646     ASCVD risk enhancing conditions: age >58, DM, HTN, former smoker  Unable to independently manage HLD, CAD, NSTEEMI  Unable to self administer medications as prescribed  Does not attend all scheduled provider appointments  Does not adhere to prescribed medication regimen  Lacks social connections  Unable to perform IADLs independently  Does not maintain contact with provider office  Does not contact provider office for questions/concerns  RN Care Manager Clinical Goal(s):   Over the next 120 days, patient will work with Consulting civil engineer, providers, and care team towards execution of optimized self-health management plan  Over the next 120 days, patient will verbalize understanding of plan for effective management of cardiac conditions.  Over the next 120 days, patient will work with [facility], pcp, and specialist  to address needs related to CAD,  HLD, complications from NSTEMI- January 2022  Over the next 120 days, patient will not experience hospital admission. Hospital Admissions in last 6 months = 3  Over the next 120 days, patient will demonstrate a decrease in cardiac events exacerbations as evidenced by compliance with plan of care and no admits to hospital   Over the next 120 days, patient will attend all scheduled medical appointments: 05-08-2020 and 05-09-2020  Interventions:  Collaboration  with Charlynne Cousins, MD regarding development and update of comprehensive plan of care as evidenced by provider attestation and co-signature  Inter-disciplinary care team collaboration (see longitudinal plan of care)  Medication review performed; medication list updated in electronic medical record.   Inter-disciplinary care team collaboration (see longitudinal plan of care)  Referred to pharmacy team for assistance with HLD medication management  Evaluation of current treatment plan related to HLD and patient's adherence to plan as established by provider.  Advised patient to call the office for changes or questions  Provided education to patient re: heart healthy/ADA diet, changes to report, taking medications as ordered   Reviewed medications with patient and discussed compliance, the patient is not always compliant with recommendations.   Collaborated with PCP and specialist office  regarding missed appointments and follow up for post discharge from the hospital. Pt sees pcp on 05-08-2020 and sees cardiology on 05-09-2020 at [place] scheduled/upcoming provider appointments including: 05-08-2020 with pcp and 05-09-2020 with cardiologist   Discussed plans with patient for ongoing care management follow up and provided patient with direct contact information for care management team  Patient Goals/Self-Care Activities:  Over the next 120 days, patient will:   -  call for medicine refill 2 or 3 days before it runs out - call if I am sick and can't take my medicine - keep a list of all the medicines I take; vitamins and herbals too - learn to read medicine labels - use a pillbox to sort medicine - use an alarm clock or phone to remind me to take my medicine - change to whole grain breads, cereal, pasta - drink 6 to 8 glasses of water each day - eat 3 to 5 servings of fruits and vegetables each day - eat 5 or 6 small meals each day - fill half the plate with nonstarchy  vegetables - join a weight loss program - manage portion size - prepare main meal at home 3 to 5 days each week - read food labels for fat, fiber, carbohydrates and portion size - be open to making changes - I can manage, know and watch for signs of a heart attack - if I have chest pain, call for help - learn about small changes that will make a big difference - learn my personal risk factors  - barriers to treatment adherence reviewed and addressed - communicable disease prevention promoted - difficulty of making life-long changes acknowledged - functional limitation screening reviewed - healthy lifestyle promoted - medication-adherence assessment completed - rescue (action) plan developed - response to pharmacologic therapy monitored - self-awareness of signs/symptoms of worsening disease encouraged  Follow Up Plan: Telephone follow up appointment with care management team member scheduled for: 05-17-2020 at 3:30 pm     Task: RNCM: Alleviate Barriers to Coronary Artery Disease Therapy   Note:   Care Management Activities:    - barriers to treatment adherence reviewed and addressed - communicable disease prevention promoted - difficulty of making life-long changes acknowledged - functional limitation screening reviewed - healthy lifestyle promoted - medication-adherence assessment completed - rescue (action) plan developed - response to pharmacologic therapy monitored - self-awareness of signs/symptoms of worsening disease encouraged         Plan:Telephone follow up appointment with care management team member scheduled for:  05-17-2020 at 3:30 pm  Noreene Larsson RN, MSN, [facility] Mobile: [phone]

## 2020-05-01 ENCOUNTER — Telehealth: Payer: Self-pay

## 2020-05-01 NOTE — Telephone Encounter (Signed)
OK for verbal orders?

## 2020-05-01 NOTE — Telephone Encounter (Signed)
Copied from [place] [id number]. Topic: Quick Communication - Home Health Verbal Orders >> May 01, 2020  3:49 PM Tessa Lerner A wrote: Caller/Agency: [facility] Number: [phone] Requesting OT/PT/Skilled Nursing/Social Work/Speech Therapy: PT Frequency: 1x8 to improve strength and balance

## 2020-05-02 DIAGNOSIS — E1142 Type 2 diabetes mellitus with diabetic polyneuropathy: Secondary | ICD-10-CM | POA: Diagnosis not present

## 2020-05-02 DIAGNOSIS — I152 Hypertension secondary to endocrine disorders: Secondary | ICD-10-CM | POA: Diagnosis not present

## 2020-05-02 DIAGNOSIS — E785 Hyperlipidemia, unspecified: Secondary | ICD-10-CM | POA: Diagnosis not present

## 2020-05-02 DIAGNOSIS — Z794 Long term (current) use of insulin: Secondary | ICD-10-CM | POA: Diagnosis not present

## 2020-05-02 DIAGNOSIS — E1169 Type 2 diabetes mellitus with other specified complication: Secondary | ICD-10-CM | POA: Diagnosis not present

## 2020-05-02 DIAGNOSIS — E1159 Type 2 diabetes mellitus with other circulatory complications: Secondary | ICD-10-CM | POA: Diagnosis not present

## 2020-05-02 NOTE — Telephone Encounter (Signed)
Called and LVM on a confidential VM giving verbal orders per Dr. Wynetta Emery.

## 2020-05-06 ENCOUNTER — Encounter (INDEPENDENT_AMBULATORY_CARE_PROVIDER_SITE_OTHER): Payer: Self-pay | Admitting: Vascular Surgery

## 2020-05-06 ENCOUNTER — Ambulatory Visit (INDEPENDENT_AMBULATORY_CARE_PROVIDER_SITE_OTHER): Payer: Medicare Other | Admitting: Vascular Surgery

## 2020-05-06 ENCOUNTER — Other Ambulatory Visit: Payer: Self-pay

## 2020-05-06 VITALS — BP 184/92 | HR 64 | Ht 71.0 in | Wt 273.0 lb

## 2020-05-06 DIAGNOSIS — I872 Venous insufficiency (chronic) (peripheral): Secondary | ICD-10-CM | POA: Diagnosis not present

## 2020-05-06 DIAGNOSIS — E119 Type 2 diabetes mellitus without complications: Secondary | ICD-10-CM | POA: Insufficient documentation

## 2020-05-06 DIAGNOSIS — E782 Mixed hyperlipidemia: Secondary | ICD-10-CM | POA: Diagnosis not present

## 2020-05-06 DIAGNOSIS — E1159 Type 2 diabetes mellitus with other circulatory complications: Secondary | ICD-10-CM

## 2020-05-06 DIAGNOSIS — R609 Edema, unspecified: Secondary | ICD-10-CM | POA: Insufficient documentation

## 2020-05-06 DIAGNOSIS — I739 Peripheral vascular disease, unspecified: Secondary | ICD-10-CM | POA: Diagnosis not present

## 2020-05-06 DIAGNOSIS — Z8619 Personal history of other infectious and parasitic diseases: Secondary | ICD-10-CM | POA: Insufficient documentation

## 2020-05-06 DIAGNOSIS — J449 Chronic obstructive pulmonary disease, unspecified: Secondary | ICD-10-CM

## 2020-05-06 DIAGNOSIS — I2510 Atherosclerotic heart disease of native coronary artery without angina pectoris: Secondary | ICD-10-CM

## 2020-05-06 DIAGNOSIS — N138 Other obstructive and reflux uropathy: Secondary | ICD-10-CM | POA: Insufficient documentation

## 2020-05-06 DIAGNOSIS — Z7189 Other specified counseling: Secondary | ICD-10-CM | POA: Insufficient documentation

## 2020-05-06 DIAGNOSIS — E785 Hyperlipidemia, unspecified: Secondary | ICD-10-CM | POA: Insufficient documentation

## 2020-05-06 DIAGNOSIS — Z23 Encounter for immunization: Secondary | ICD-10-CM | POA: Insufficient documentation

## 2020-05-06 DIAGNOSIS — N401 Enlarged prostate with lower urinary tract symptoms: Secondary | ICD-10-CM | POA: Insufficient documentation

## 2020-05-06 HISTORY — DX: Personal history of other infectious and parasitic diseases: Z86.19

## 2020-05-08 ENCOUNTER — Encounter: Payer: Self-pay | Admitting: Nurse Practitioner

## 2020-05-08 ENCOUNTER — Ambulatory Visit (INDEPENDENT_AMBULATORY_CARE_PROVIDER_SITE_OTHER): Payer: Medicare Other | Admitting: Nurse Practitioner

## 2020-05-08 ENCOUNTER — Other Ambulatory Visit: Payer: Self-pay

## 2020-05-08 ENCOUNTER — Encounter (INDEPENDENT_AMBULATORY_CARE_PROVIDER_SITE_OTHER): Payer: Self-pay | Admitting: Vascular Surgery

## 2020-05-08 VITALS — BP 138/80 | HR 60 | Temp 98.1°F | Ht 71.0 in | Wt 273.0 lb

## 2020-05-08 DIAGNOSIS — J449 Chronic obstructive pulmonary disease, unspecified: Secondary | ICD-10-CM | POA: Diagnosis not present

## 2020-05-08 DIAGNOSIS — L851 Acquired keratosis [keratoderma] palmaris et plantaris: Secondary | ICD-10-CM | POA: Diagnosis not present

## 2020-05-08 DIAGNOSIS — I2510 Atherosclerotic heart disease of native coronary artery without angina pectoris: Secondary | ICD-10-CM

## 2020-05-08 DIAGNOSIS — R251 Tremor, unspecified: Secondary | ICD-10-CM | POA: Diagnosis not present

## 2020-05-08 DIAGNOSIS — Z9181 History of falling: Secondary | ICD-10-CM | POA: Diagnosis not present

## 2020-05-08 DIAGNOSIS — I739 Peripheral vascular disease, unspecified: Secondary | ICD-10-CM

## 2020-05-08 DIAGNOSIS — E1169 Type 2 diabetes mellitus with other specified complication: Secondary | ICD-10-CM

## 2020-05-08 DIAGNOSIS — E1159 Type 2 diabetes mellitus with other circulatory complications: Secondary | ICD-10-CM | POA: Diagnosis not present

## 2020-05-08 DIAGNOSIS — E1129 Type 2 diabetes mellitus with other diabetic kidney complication: Secondary | ICD-10-CM

## 2020-05-08 DIAGNOSIS — N401 Enlarged prostate with lower urinary tract symptoms: Secondary | ICD-10-CM | POA: Diagnosis not present

## 2020-05-08 DIAGNOSIS — R350 Frequency of micturition: Secondary | ICD-10-CM

## 2020-05-08 DIAGNOSIS — R413 Other amnesia: Secondary | ICD-10-CM | POA: Diagnosis not present

## 2020-05-08 DIAGNOSIS — I152 Hypertension secondary to endocrine disorders: Secondary | ICD-10-CM

## 2020-05-08 DIAGNOSIS — I214 Non-ST elevation (NSTEMI) myocardial infarction: Secondary | ICD-10-CM | POA: Diagnosis not present

## 2020-05-08 DIAGNOSIS — R809 Proteinuria, unspecified: Secondary | ICD-10-CM

## 2020-05-08 DIAGNOSIS — E1140 Type 2 diabetes mellitus with diabetic neuropathy, unspecified: Secondary | ICD-10-CM | POA: Diagnosis not present

## 2020-05-08 DIAGNOSIS — Z794 Long term (current) use of insulin: Secondary | ICD-10-CM | POA: Diagnosis not present

## 2020-05-08 DIAGNOSIS — E785 Hyperlipidemia, unspecified: Secondary | ICD-10-CM

## 2020-05-08 DIAGNOSIS — B351 Tinea unguium: Secondary | ICD-10-CM | POA: Diagnosis not present

## 2020-05-08 MED ORDER — CLONIDINE HCL 0.1 MG PO TABS: 0.1000 mg | ORAL_TABLET | Freq: Two times a day (BID) | ORAL | 0 refills | Status: DC

## 2020-05-08 MED ORDER — QVAR REDIHALER 40 MCG/ACT IN AERB: 1.0000 | INHALATION_SPRAY | Freq: Two times a day (BID) | RESPIRATORY_TRACT | 4 refills | Status: DC

## 2020-05-08 NOTE — Assessment & Plan Note (Signed)
Chronic, ongoing. Recent visit with vascular 05/06/20, initial visit, continue this collaboration.  Note reviewed.

## 2020-05-08 NOTE — Assessment & Plan Note (Signed)
Has follow-up with cardiology tomorrow, will plan on reviewing note once available.  Continue all current medications.

## 2020-05-08 NOTE — Assessment & Plan Note (Signed)
Chronic, ongoing with BP elevated initial, but improved on recheck and home BP stable.  Educated him on goal of <130/80.  Has underlying circulation issues and have recommended he wear compression hose at home + decrease sodium intake.  Continue current medication regimen + continue collaboration with cardiology and [place] PCP.  Recommend he bring all medications to his provider visits and document all BP and BS readings, which should bring with him -- as is poor historian and unsure what he is exactly taking daily at home.  Reviewed medications exensively with him today -- he sees multiple specialists and [facility] providers -- discussed with him that this can complicate matters and he should always bring medications with him to all visits. Return in 1 week to meet new PCP and ensure taking medications appropriately -- reorganized his medications and reviewed again today.  May benefit from palliative referral in future.

## 2020-05-08 NOTE — Assessment & Plan Note (Signed)
Chronic, ongoing, followed by neurology.  Continue this collaboration and medication regimen.  Return in 3 months.

## 2020-05-08 NOTE — Assessment & Plan Note (Signed)
Chronic, ongoing.  Followed by endocrinology with A1C 6.9% recently, in 2021 urine ALB 150 and A:C >300, continue Olmesartan for kidney protection.  Continue current collaboration endocrinology and medication regimen as prescribed by them.  Recommend focus on modest weight loss and diet changes.  Reviewed endocrinology notes.  Continue collaboration with CCM team to assist in ongoing education about diabetes. Return in 1 week to meet new PCP.

## 2020-05-08 NOTE — Progress Notes (Signed)
MRN : [id number]  Justin Griffith is a 80 y.o. (May 03, 1940) male who presents with chief complaint of  Chief Complaint  Patient presents with  . New Patient (Initial Visit)    White. BLE. Pain  .  History of Present Illness:   The patient is seen for evaluation of painful lower extremities. Patient notes the pain is variable and not always associated with activity.  The pain is somewhat consistent day to day occurring on most days. The patient notes the pain also occurs with standing and routinely seems worse as the day wears on. The pain has been progressive over the past several years. The patient states these symptoms are causing  a profound negative impact on quality of life and daily activities.  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  There is a  history of back problems and DJD of the lumbar and sacral spine.   The patient denies rest pain or dangling of an extremity off the side of the bed during the night for relief. No open wounds or sores at this time. No history of DVT or phlebitis. No prior interventions or surgeries.    Current Meds  Medication Sig  . acyclovir (ZOVIRAX) 400 MG tablet Take 400 mg by mouth 2 (two) times daily.  . cloNIDine (CATAPRES) 0.1 MG tablet Take 1 tablet (0.1 mg total) by mouth 2 (two) times daily.  Marland Kitchen donepezil (ARICEPT) 5 MG tablet Take 1 tablet (5 mg total) by mouth at bedtime.  . furosemide (LASIX) 20 MG tablet Take 1 tablet (20 mg total) by mouth daily.  . rosuvastatin (CRESTOR) 40 MG tablet Take 1 tablet (40 mg total) by mouth at bedtime.  . tamsulosin (FLOMAX) 0.4 MG CAPS capsule Take 0.4 mg by mouth daily. Patient restarted on his own due to trouble urinating    Past Medical History:  Diagnosis Date  . Arthritis   . Asthma 2000  . Back pain   . Colon polyp 2012  . COPD (chronic obstructive pulmonary disease) ([place])   . Emphysema of lung ([place])   . Heart murmur   . Hernia 2000  .  Hyperlipidemia   . Hypertension   . Personal history of malignant neoplasm of large intestine   . Personal history of tobacco use, presenting hazards to health   . Sleep apnea   . Special screening for malignant neoplasms, colon   . Type 2 diabetes mellitus with proteinuria ([place]) 10/11/2014    Past Surgical History:  Procedure Laterality Date  . CARDIAC CATHETERIZATION Left 09/25/2015   Procedure: Left Heart Cath and Coronary Angiography;  Surgeon: Yolonda Kida, MD;  Location: [facility];  Service: Cardiovascular;  Laterality: Left;  . CHOLECYSTECTOMY  1970  . COLON SURGERY  07-16-99   sigmoid colon resection with primary anastomosis, chemotherapy for metastatic disease  . COLONOSCOPY  2001, 2012   Dr Bary Castilla, tubular adenoma of the cecum and ascending colon in 2012.  Marland Kitchen COLONOSCOPY WITH PROPOFOL N/A 02/12/2016   Procedure: COLONOSCOPY WITH PROPOFOL;  Surgeon: Robert Bellow, MD;  Location[facility]COPY;  Service: Endoscopy;  Laterality: N/A;  . ESOPHAGOGASTRODUODENOSCOPY (EGD) WITH PROPOFOL N/A 02/12/2016   Procedure: ESOPHAGOGASTRODUODENOSCOPY (EGD) WITH PROPOFOL;  Surgeon: Robert Bellow, MD;  Location: [facility] ENDOSCOPY;  Service: Endoscopy;  Laterality: N/A;  . HERNIA REPAIR Right    right inguinal hernia repair  . JOINT REPLACEMENT Bilateral    knee replacement  .  KNEE SURGERY Bilateral 2010  . MYRINGOTOMY WITH TUBE PLACEMENT Bilateral 08/16/2014   Procedure: MYRINGOTOMY WITH TUBE PLACEMENT;  Surgeon: Carloyn Manner, MD;  Location: [facility] ORS;  Service: ENT;  Laterality: Bilateral;  . PERIPHERAL VASCULAR CATHETERIZATION Right 09/04/2015   Procedure: Lower Extremity Angiography;  Surgeon: Katha Cabal, MD;  Location: [facility];  Service: Cardiovascular;  Laterality: Right;  . PERIPHERAL VASCULAR CATHETERIZATION  09/04/2015   Procedure: Lower Extremity Intervention;  Surgeon: Katha Cabal, MD;  Location: [facility];  Service:  Cardiovascular;;  . TONSILLECTOMY    . TYMPANOSTOMY TUBE PLACEMENT      Social History Social History   Tobacco Use  . Smoking status: Former Smoker    Packs/day: 1.00    Years: 30.00    Pack years: 30.00    Types: Cigarettes    Quit date: 03/23/1989    Years since quitting: 31.1  . Smokeless tobacco: Never Used  Vaping Use  . Vaping Use: Never used  Substance Use Topics  . Alcohol use: No    Alcohol/week: 0.0 standard drinks  . Drug use: No    Family History Family History  Problem Relation Age of Onset  . Diabetes Father   . Esophageal cancer Mother   . Alzheimer's disease Paternal Uncle   No family history of bleeding/clotting disorders, porphyria or autoimmune disease   Allergies  Allergen Reactions  . Dulaglutide Diarrhea and Other (See Comments)  . Liraglutide Diarrhea and Other (See Comments)  . Farxiga [Dapagliflozin] Rash  . Jardiance [Empagliflozin] Rash     REVIEW OF SYSTEMS (Negative unless checked)  Constitutional: [] Weight loss  [] Fever  [] Chills Cardiac: [] Chest pain   [] Chest pressure   [] Palpitations   [] Shortness of breath when laying flat   [] Shortness of breath with exertion. Vascular:  [x] Pain in legs with walking   [x] Pain in legs at rest  [] History of DVT   [] Phlebitis   [x] Swelling in legs   [] Varicose veins   [] Non-healing ulcers Pulmonary:   [] Uses home oxygen   [] Productive cough   [] Hemoptysis   [] Wheeze  [] COPD   [] Asthma Neurologic:  [] Dizziness   [] Seizures   [] History of stroke   [] History of TIA  [] Aphasia   [] Vissual changes   [] Weakness or numbness in arm   [] Weakness or numbness in leg Musculoskeletal:   [] Joint swelling   [x] Joint pain   [] Low back pain Hematologic:  [] Easy bruising  [] Easy bleeding   [] Hypercoagulable state   [] Anemic Gastrointestinal:  [] Diarrhea   [] Vomiting  [] Gastroesophageal reflux/heartburn   [] Difficulty swallowing. Genitourinary:  [] Chronic kidney disease   [] Difficult urination  [] Frequent urination    [] Blood in urine Skin:  [x] Rashes   [] Ulcers  Psychological:  [] History of anxiety   []  History of major depression.  Physical Examination  Vitals:   05/06/20 1003  BP: (!) 184/92  Pulse: 64  Weight: 273 lb (123.8 kg)  Height: 5\' 11"  (1.803 m)   Body mass index is 38.08 kg/m. Gen: WD/WN, NAD morbidly obese Head: [place]/AT, No temporalis wasting.  Ear/Nose/Throat: Hearing grossly intact, nares w/o erythema or drainage, poor dentition Eyes: PER, EOMI, sclera nonicteric.  Neck: Supple, no masses.  No bruit or JVD.  Pulmonary:  Good air movement, clear to auscultation bilaterally, no use of accessory muscles.  Cardiac: RRR, normal S1, S2, no Murmurs. Vascular: Severe venous stasis disease bilaterally with marked woody edema.  No open wounds or sores. Vessel Right Left  Radial Palpable Palpable  PT  not palpable  not palpable  DP  not palpable  not palpable  Gastrointestinal: soft, non-distended. No guarding/no peritoneal signs.  Musculoskeletal: M/S 5/5 throughout.  No deformity or atrophy.  Neurologic: CN 2-12 intact. Pain and light touch intact in extremities.  Symmetrical.  Speech is fluent. Motor exam as listed above. Psychiatric: Judgment intact, Mood & affect appropriate for pt's clinical situation. Dermatologic: Severe venous rashes no ulcers noted.  No changes consistent with cellulitis. Lymph : Positive lichenification and skin changes of chronic lymphedema.  CBC Lab Results  Component Value Date   WBC 7.4 04/03/2020   HGB 14.2 04/03/2020   HCT 40.3 04/03/2020   MCV 89.0 04/03/2020   PLT 280 04/03/2020    BMET    Component Value Date/Time   NA 137 04/17/2020 1512   K 4.4 04/17/2020 1512   CL 102 04/17/2020 1512   CO2 20 04/17/2020 1512   GLUCOSE 194 (H) 04/17/2020 1512   GLUCOSE 172 (H) 04/03/2020 0801   BUN 11 04/17/2020 1512   CREATININE 1.08 04/17/2020 1512   CALCIUM 10.8 (H) 04/17/2020 1512   GFRNONAA 64 04/17/2020 1512   GFRNONAA >60 04/03/2020 0801    GFRAA 75 04/17/2020 1512   Estimated Creatinine Clearance: 73.1 mL/min (by C-G formula based on SCr of 1.08 mg/dL).  COAG Lab Results  Component Value Date   INR 1.2 03/28/2020   INR 1.2 01/26/2020   INR 1.1 01/23/2020    Radiology No results found.   Assessment/Plan 1. PAD (peripheral artery disease) (HCC)  Recommend:  The patient has atypical pain symptoms for pure atherosclerotic disease. However, on physical exam there is evidence of mixed venous and arterial disease, given the diminished pulses and the edema associated with venous changes of the legs.  Noninvasive studies including ABI's and venous ultrasound of the legs will be obtained and the patient will follow up with me to review these studies.  I suspect the patient is c/o pseudoclaudication.  Patient should have an evaluation of his LS spine which I defer to the primary service.  The patient should continue walking and begin a more formal exercise program. The patient should continue his antiplatelet therapy and aggressive treatment of the lipid abnormalities.  The patient should begin wearing graduated compression socks 15-20 mmHg strength to control edema.  - VAS [place] ABI WITH/WO TBI; Future  2. Chronic venous insufficiency  Recommend:  The patient has atypical pain symptoms for pure atherosclerotic disease. However, on physical exam there is evidence of mixed venous and arterial disease, given the diminished pulses and the edema associated with venous changes of the legs.  Noninvasive studies including ABI's and venous ultrasound of the legs will be obtained and the patient will follow up with me to review these studies.  I suspect the patient is c/o pseudoclaudication.  Patient should have an evaluation of his LS spine which I defer to the primary service.  The patient should continue walking and begin a more formal exercise program. The patient should continue his antiplatelet therapy and aggressive treatment  of the lipid abnormalities.  The patient should begin wearing graduated compression socks 15-20 mmHg strength to control edema.  - VAS [place] LOWER EXTREMITY VENOUS (DVT); Future  3. Coronary artery disease, unspecified vessel or lesion type, unspecified whether angina present, unspecified whether native or transplanted heart Continue cardiac and antihypertensive medications as already ordered and reviewed, no changes at this time.  Continue statin as ordered and reviewed, no changes at this time  Nitrates  PRN for chest pain   4. Chronic obstructive pulmonary disease, unspecified COPD type ([place]) Continue pulmonary medications and aerosols as already ordered, these medications have been reviewed and there are no changes at this time.    5. Type 2 diabetes mellitus with other circulatory complication, unspecified whether long term insulin use (HCC) Continue hypoglycemic medications as already ordered, these medications have been reviewed and there are no changes at this time.  Hgb A1C to be monitored as already arranged by primary service   6. Mixed hyperlipidemia Continue statin as ordered and reviewed, no changes at this time     Hortencia Pilar, MD  05/08/2020 8:27 AM

## 2020-05-08 NOTE — Assessment & Plan Note (Signed)
Chronic, ongoing, continue Flomax daily and collaboration with urology.

## 2020-05-08 NOTE — Assessment & Plan Note (Signed)
Chronic, ongoing.  Followed by pulmonary.  Continue current medication regimen as prescribed by them and adjust as needed -- discussed need to take Qvar BID as is ordered.  Continue Mucinex at home as needed.  Refills sent in.

## 2020-05-08 NOTE — Progress Notes (Addendum)
BP 138/80 (BP Location: Left Arm)   Pulse 60   Temp 98.1 F (36.7 C) (Oral)   Ht 5\' 11"  (1.803 m)   Wt 273 lb (123.8 kg)   SpO2 97%   BMI 38.08 kg/m    Subjective:    Patient ID: Justin Griffith, male    DOB: 05/31/1940, 80 y.o.   MRN: [id number]  HPI: Justin Griffith is a 80 y.o. male  Chief Complaint  Patient presents with  . Hypertension  . Medication Management  . Fall    [place] last Friday, fell on someone's car bumper and hurt his left side and now has a black eye.    DIABETES Followed by [name], with last visit 05/02/20.  Last A1C was 6.9% on 03/29/20.  He also sees a PCP at the [name].  CCM team working with patient.  Labs on 05/02/20 -- glucose 198, K+ 4.5, BUN 9, GFR 72.  Last visit his TSH 3.530 in December. Hypoglycemic episodes: none Polydipsia/polyuria: no Visual disturbance: no Chest pain: no Paresthesias: no Glucose Monitoring: yes             Accucheck frequency: BID             Fasting glucose:  120's             Post prandial:              Evening:             Before meals: Taking Insulin?: yes             Long acting insulin: Lantus 25 units              Short acting insulin: Blood Pressure Monitoring: not checking Retinal Examination: At [place] Exam: Up to Date Pneumovax: Up to Date Influenza: Up to Date Aspirin: yes   HYPERTENSION/HLD Taking Olmesartan 40 MG daily, Amlodipine 10 MG (this was started in rehab post hospital he reports, but wishes to stop as in past caused edema), Lasix 20 MG,and ASA+ Crestor 40 MG daily.Has Clonidine 0.1 MG BID and Carvedilol 25 MG BID on list, these were in pill box.  Sees [name] for cardiology, last on 02/06/2020 - sees them tomorrow. Last EF 60-65% on 01/24/2020.  LDL in November LD 48.  History of NSTEMI 03/28/2020.  Brought all medications in with him today, with exception of Aricept, Metformin, Primidone -- he reports he is taking these.  Discussed with him at length concerns about his self medication  management -- he has a home health nurse checking on him at this time.  Recently seen by Dr. Erlene Quan on 02/21/2020 for BPH, continues on Flomax. Hypertension status:uncontrolled Satisfied with current treatment?yes Duration of hypertension:chronic BP monitoring frequency:daily BP range:130-140 range SBP and 70-80 DBP BP medication side effects:no Medication compliance:good compliance Previous BP meds:Losartan, Clonidine, HCTZ, Carvedilol, Amlodipine, Lasix Aspirin:yes Recurrent headaches:no Visual changes:no Palpitations:no Dyspnea:At baseline Chest pain:no Lower extremity edema:mild,baseline Dizzy/lightheaded:no  COPD Followed by [name], last seen11/18/2021. COPD status:stable Satisfied with current treatment?:yes Oxygen use:no Dyspnea frequency:at baseline Cough frequency:present, chronic - baseline Rescue inhaler frequency:Uses Duoneb a few times a week Limitation of activity:at times Productive cough: none Last Spirometry:with pulmonary Pneumovax:Up to Date Influenza:Up to Date  BENIGN ESSENTIAL TREMOR & MEMORY CHANGES Followed by neurology with last visit 02/05/2020.  They recently increased his Primidone to 100 MG BID.  He continues on Aricept for memory changes being present, started by this PCP.  He reports  he feels neurology is not assisting him and does not wish to return to them at this time, endorses a poor rapport with neurology team.  Had recent incident in parking lot at grocery store one week ago where he was loading trunk with groceries and felt off balance, ended up falling and landing against the plastic fender of car next to him.  Reports black eye from this incident, but no other injuries.  Denies pain.    6CIT Screen 05/08/2020 01/01/2020 08/22/2018 08/20/2017  What Year? 0 points 4 points 0 points 0 points  What month? 0 points 0 points 0 points 0 points  What time? 0 points 0 points 0 points 0 points  Count back  from 20 2 points 0 points 0 points 0 points  Months in reverse 2 points 0 points 0 points 0 points  Repeat phrase 8 points 8 points 0 points 0 points  Total Score 12 12 0 0    Relevant past medical, surgical, family and social history reviewed and updated as indicated. Interim medical history since our last visit reviewed. Allergies and medications reviewed and updated.  Review of Systems  Constitutional: Negative for activity change, diaphoresis, fatigue and fever.  Respiratory: Negative for cough, chest tightness, shortness of breath and wheezing.   Cardiovascular: Negative for chest pain, palpitations and leg swelling.  Gastrointestinal: Negative.   Endocrine: Negative for cold intolerance, heat intolerance, polydipsia, polyphagia and polyuria.  Neurological: Negative.   Psychiatric/Behavioral: Negative.     Per HPI unless specifically indicated above     Objective:    BP 138/80 (BP Location: Left Arm)   Pulse 60   Temp 98.1 F (36.7 C) (Oral)   Ht 5\' 11"  (1.803 m)   Wt 273 lb (123.8 kg)   SpO2 97%   BMI 38.08 kg/m   Wt Readings from Last 3 Encounters:  05/08/20 273 lb (123.8 kg)  05/06/20 273 lb (123.8 kg)  04/03/20 233 lb 3.2 oz (105.8 kg)    Physical Exam Vitals and nursing note reviewed.  Constitutional:      General: He is awake. He is not in acute distress.    Appearance: He is well-developed and well-groomed. He is morbidly obese. He is not ill-appearing.  HENT:     Head: Normocephalic and atraumatic.     Right Ear: Hearing and external ear normal. No drainage.     Left Ear: Hearing and external ear normal. No drainage.  Eyes:     General: Lids are normal.        Right eye: No discharge.        Left eye: No discharge.     Extraocular Movements: Extraocular movements intact.     Conjunctiva/sclera: Conjunctivae normal.     Pupils: Pupils are equal, round, and reactive to light.     Visual Fields: Right eye visual fields normal and left eye visual fields  normal.     Comments: Pale purple to yellow bruising present around exterior of left eye.  Vision intact.  No tenderness.  Neck:     Thyroid: No thyromegaly.     Vascular: No carotid bruit or JVD.  Cardiovascular:     Rate and Rhythm: Normal rate and regular rhythm.     Heart sounds: S1 normal and S2 normal. Murmur heard.   Systolic murmur is present with a grade of 3/6. No gallop.      Comments: Systolic murmur, known, present with radiation into carotids. Pulmonary:     Effort:  Pulmonary effort is normal. No accessory muscle usage or respiratory distress.     Breath sounds: Decreased breath sounds present. No wheezing.     Comments: Decreased breath sounds throughout with occasional expiratory wheezing -- baseline.  No SOB with talking.    Abdominal:     General: Bowel sounds are normal.     Palpations: Abdomen is soft. There is no hepatomegaly or splenomegaly.     Tenderness: There is no abdominal tenderness.  Musculoskeletal:        General: Normal range of motion.     Cervical back: Normal range of motion and neck supple.     Right lower leg: Edema (trace) present.     Left lower leg: Edema (trace) present.  Lymphadenopathy:     Cervical: No cervical adenopathy.  Skin:    General: Skin is warm and dry.     Capillary Refill: Capillary refill takes less than 2 seconds.     Comments: Rubor and xerosis bilateral lower extremity.  Neurological:     Mental Status: He is alert and oriented to person, place, and time.     Cranial Nerves: Cranial nerves are intact.     Motor: Tremor present.     Gait: Gait is intact.     Deep Tendon Reflexes:     Reflex Scores:      Brachioradialis reflexes are 1+ on the right side and 1+ on the left side.      Patellar reflexes are 1+ on the right side and 1+ on the left side.    Comments: Baseline resting tremor noted to upper extremities R>L.  Negative Cogwheel.  Psychiatric:        Mood and Affect: Mood normal.        Behavior: Behavior  normal. Behavior is cooperative.        Thought Content: Thought content normal.        Judgment: Judgment normal.     Results for orders placed or performed in visit on 04/17/20  Glucose Hemocue Waived (STAT)  Result Value Ref Range   Glu Hemocue Waived 175 (H) 65 - 99 mg/dL  Basic Metabolic Panel (BMET)  Result Value Ref Range   Glucose 194 (H) 65 - 99 mg/dL   BUN 11 8 - 27 mg/dL   Creatinine, Ser 1.08 0.76 - 1.27 mg/dL   GFR calc non Af Amer 64 >59 mL/min/1.73   GFR calc Af Amer 75 >59 mL/min/1.73   BUN/Creatinine Ratio 10 10 - 24   Sodium 137 134 - 144 mmol/L   Potassium 4.4 3.5 - 5.2 mmol/L   Chloride 102 96 - 106 mmol/L   CO2 20 20 - 29 mmol/L   Calcium 10.8 (H) 8.6 - 10.2 mg/dL      Assessment & Plan:   Problem List Items Addressed This Visit      Cardiovascular and Mediastinum   Hypertension associated with diabetes ([place])    Chronic, ongoing with BP elevated initial, but improved on recheck and home BP stable.  Educated him on goal of <130/80.  Has underlying circulation issues and have recommended he wear compression hose at home + decrease sodium intake.  Continue current medication regimen + continue collaboration with cardiology and [place] PCP.  Recommend he bring all medications to his provider visits and document all BP and BS readings, which should bring with him -- as is poor historian and unsure what he is exactly taking daily at home.  Reviewed medications exensively with him today --  he sees multiple specialists and [facility] providers -- discussed with him that this can complicate matters and he should always bring medications with him to all visits. Return in 1 week to meet new PCP and ensure taking medications appropriately -- reorganized his medications and reviewed again today.  May benefit from palliative referral in future.      Relevant Medications   cloNIDine (CATAPRES) 0.1 MG tablet   PAD (peripheral artery disease) (HCC)    Chronic, ongoing. Recent visit with  vascular 05/06/20, initial visit, continue this collaboration.  Note reviewed.      Relevant Medications   cloNIDine (CATAPRES) 0.1 MG tablet   NSTEMI (non-ST elevated myocardial infarction) [facility])    Has follow-up with cardiology tomorrow, will plan on reviewing note once available.  Continue all current medications.      Relevant Medications   cloNIDine (CATAPRES) 0.1 MG tablet     Respiratory   COPD (chronic obstructive pulmonary disease) (HCC)    Chronic, ongoing.  Followed by pulmonary.  Continue current medication regimen as prescribed by them and adjust as needed -- discussed need to take Qvar BID as is ordered.  Continue Mucinex at home as needed.  Refills sent in.      Relevant Medications   QVAR REDIHALER 40 MCG/ACT inhaler     Endocrine   Type 2 diabetes mellitus with proteinuria (HCC) - Primary    Chronic, ongoing.  Followed by endocrinology with A1C 6.9% recently, in 2021 urine ALB 150 and A:C >300, continue Olmesartan for kidney protection.  Continue current collaboration endocrinology and medication regimen as prescribed by them.  Recommend focus on modest weight loss and diet changes.  Reviewed endocrinology notes.  Continue collaboration with CCM team to assist in ongoing education about diabetes. Return in 1 week to meet new PCP.      Hyperlipidemia associated with type 2 diabetes mellitus (HCC)    Chronic, ongoing.  Continue current medication regimen and adjust as needed.  Lipid panel next visit.        Genitourinary   BPH (benign prostatic hyperplasia)    Chronic, ongoing, continue Flomax daily and collaboration with urology.        Other   Morbid obesity ([place])    With T2DM and COPD. Recommended eating smaller high protein, low fat meals more frequently and exercising 30 mins a day 5 times a week with a goal of 10-15lb weight loss in the next 3 months. Patient voiced their understanding and motivation to adhere to these recommendations.       Tremor     Chronic, ongoing, followed by neurology.  Continue this collaboration and medication regimen.  Return in 3 months.      Memory changes    Ongoing issue which complicates care, he also cares for wife with dementia.  At this time will continue Aricept daily.  Recommend follow-up with neurology, which he is not interested in.  Discussed with him referral to new provider due to his current concerns about rapport with current neurologist, he wishes to think about this.  Would benefit from palliative care referral in future.      At high risk for falls    Has had PT multiple times, continue home health at this time.  Recent fall with shiner present left eye, healing.  No other injuries noted.  May benefit from home PT again in future and highly recommend return to neurology.          Follow up plan: Return  in about 1 week (around 05/15/2020) for Meet Dr. Neomia Dear and BP check.

## 2020-05-08 NOTE — Patient Instructions (Signed)

## 2020-05-08 NOTE — Assessment & Plan Note (Signed)
Has had PT multiple times, continue home health at this time.  Recent fall with shiner present left eye, healing.  No other injuries noted.  May benefit from home PT again in future and highly recommend return to neurology.

## 2020-05-08 NOTE — Assessment & Plan Note (Signed)
Ongoing issue which complicates care, he also cares for wife with dementia.  At this time will continue Aricept daily.  Recommend follow-up with neurology, which he is not interested in.  Discussed with him referral to new provider due to his current concerns about rapport with current neurologist, he wishes to think about this.  Would benefit from palliative care referral in future.

## 2020-05-08 NOTE — Assessment & Plan Note (Signed)
With T2DM and COPD. Recommended eating smaller high protein, low fat meals more frequently and exercising 30 mins a day 5 times a week with a goal of 10-15lb weight loss in the next 3 months. Patient voiced their understanding and motivation to adhere to these recommendations.

## 2020-05-08 NOTE — Assessment & Plan Note (Signed)
Chronic, ongoing.  Continue current medication regimen and adjust as needed.  Lipid panel next visit.    

## 2020-05-09 ENCOUNTER — Telehealth: Payer: Self-pay

## 2020-05-09 DIAGNOSIS — J449 Chronic obstructive pulmonary disease, unspecified: Secondary | ICD-10-CM | POA: Diagnosis not present

## 2020-05-09 DIAGNOSIS — M79604 Pain in right leg: Secondary | ICD-10-CM | POA: Diagnosis not present

## 2020-05-09 DIAGNOSIS — R55 Syncope and collapse: Secondary | ICD-10-CM | POA: Diagnosis not present

## 2020-05-09 DIAGNOSIS — E119 Type 2 diabetes mellitus without complications: Secondary | ICD-10-CM | POA: Diagnosis not present

## 2020-05-09 DIAGNOSIS — I2510 Atherosclerotic heart disease of native coronary artery without angina pectoris: Secondary | ICD-10-CM | POA: Diagnosis not present

## 2020-05-09 DIAGNOSIS — R0789 Other chest pain: Secondary | ICD-10-CM | POA: Diagnosis not present

## 2020-05-09 DIAGNOSIS — G250 Essential tremor: Secondary | ICD-10-CM | POA: Diagnosis not present

## 2020-05-09 DIAGNOSIS — E782 Mixed hyperlipidemia: Secondary | ICD-10-CM | POA: Diagnosis not present

## 2020-05-09 DIAGNOSIS — R0609 Other forms of dyspnea: Secondary | ICD-10-CM | POA: Diagnosis not present

## 2020-05-09 DIAGNOSIS — E669 Obesity, unspecified: Secondary | ICD-10-CM | POA: Diagnosis not present

## 2020-05-09 DIAGNOSIS — I10 Essential (primary) hypertension: Secondary | ICD-10-CM | POA: Diagnosis not present

## 2020-05-09 DIAGNOSIS — G4733 Obstructive sleep apnea (adult) (pediatric): Secondary | ICD-10-CM | POA: Diagnosis not present

## 2020-05-09 NOTE — Telephone Encounter (Signed)
Has been on QVAR started by pulmonary, [name], and should continue on this.  The last visit with them 03/26/20, he wanted patient to stay on this.  Please try for PA and alert them this is pulmonary patient and pulmonary regimen, that changing this would be confusing for patient and may not offer the benefit he gets with QVAR.

## 2020-05-09 NOTE — Telephone Encounter (Signed)
Received fax this afternoon requesting PA for Qvar inhaler. Preferred alternatives are Budesonide, Spiriva, Flovent HFA, Pulmicort Flexhaler, Symbicort, or Breo Ellipta.   Can either of these be sent in? Or attempt PA for Qvar?

## 2020-05-10 ENCOUNTER — Telehealth: Payer: Self-pay | Admitting: Internal Medicine

## 2020-05-10 NOTE — Telephone Encounter (Signed)
No, he should continue on all diabetes medications at this time.

## 2020-05-10 NOTE — Telephone Encounter (Signed)
Please advise 

## 2020-05-10 NOTE — Telephone Encounter (Signed)
Called to ask if the doctor has discontinued the patient's Metformin at his last visit with Jolene.  Please call to clarify at [phone]

## 2020-05-10 NOTE — Telephone Encounter (Signed)
Spoke with Angie regarding patients medications. Clarified patient is to not stop metformin.

## 2020-05-15 ENCOUNTER — Ambulatory Visit (INDEPENDENT_AMBULATORY_CARE_PROVIDER_SITE_OTHER): Payer: Medicare Other | Admitting: Internal Medicine

## 2020-05-15 ENCOUNTER — Other Ambulatory Visit: Payer: Self-pay

## 2020-05-15 ENCOUNTER — Encounter: Payer: Self-pay | Admitting: Internal Medicine

## 2020-05-15 VITALS — BP 160/82 | HR 124 | Temp 99.0°F | Ht 71.0 in | Wt 268.0 lb

## 2020-05-15 DIAGNOSIS — E119 Type 2 diabetes mellitus without complications: Secondary | ICD-10-CM | POA: Diagnosis not present

## 2020-05-15 DIAGNOSIS — Z125 Encounter for screening for malignant neoplasm of prostate: Secondary | ICD-10-CM

## 2020-05-15 DIAGNOSIS — J449 Chronic obstructive pulmonary disease, unspecified: Secondary | ICD-10-CM

## 2020-05-15 MED ORDER — BACITRA-NEOMYCIN-POLYMYXIN-HC 1 % OP OINT: 1.0000 "application " | TOPICAL_OINTMENT | Freq: Two times a day (BID) | OPHTHALMIC | 0 refills | Status: DC

## 2020-05-15 MED ORDER — SPIRIVA RESPIMAT 2.5 MCG/ACT IN AERS: 2.0000 | INHALATION_SPRAY | Freq: Every day | RESPIRATORY_TRACT | 3 refills | Status: DC

## 2020-05-15 MED ORDER — METHYLPREDNISOLONE SODIUM SUCC 40 MG IJ SOLR: 80.0000 mg | Freq: Once | INTRAMUSCULAR | 0 refills | Status: DC

## 2020-05-15 MED ORDER — IPRATROPIUM-ALBUTEROL 0.5-2.5 (3) MG/3ML IN SOLN
3.0000 mL | Freq: Once | RESPIRATORY_TRACT | Status: AC
  Administered 2020-05-15 (×2): 3 mL via RESPIRATORY_TRACT

## 2020-05-15 MED ORDER — CLOTRIMAZOLE-BETAMETHASONE 1-0.05 % EX CREA: 1.0000 "application " | TOPICAL_CREAM | Freq: Two times a day (BID) | CUTANEOUS | 0 refills | Status: DC

## 2020-05-15 NOTE — Progress Notes (Signed)
BP (!) 161/97   Pulse (!) 110   Temp 99 F (37.2 C) (Oral)   Ht 5\' 11"  (1.803 m)   Wt 268 lb (121.6 kg)   SpO2 95%   BMI 37.38 kg/m    Subjective:    Patient ID: Justin Griffith, male    DOB: 08/29/1940, 80 y.o.   MRN: [id number]  HPI: Justin Griffith is a 80 y.o. male  Hypertension This is a chronic problem. The current episode started more than 1 year ago. The problem is uncontrolled. Associated symptoms include shortness of breath. Pertinent negatives include no anxiety, blurred vision, chest pain, headaches, malaise/fatigue, neck pain, orthopnea, palpitations, peripheral edema, PND or sweats.  Shortness of Breath Pertinent negatives include no chest pain, headaches, neck pain, orthopnea or PND.  Chest Pain  Associated symptoms include shortness of breath. Pertinent negatives include no headaches, malaise/fatigue, orthopnea, palpitations or PND.  His past medical history is significant for hypertension.     Chief Complaint  Patient presents with  . Hypertension  . Shortness of Breath  . needs help walking    Patient states that he has been having to hold on to thing to help him walk so that he does not fall again.  . Chest Pain    Right chest pain Dr. Arlana Pouch cardio, and under left breast from recent fall    Relevant past medical, surgical, family and social history reviewed and updated as indicated. Interim medical history since our last visit reviewed. Allergies and medications reviewed and updated.  Review of Systems  Constitutional: Negative for malaise/fatigue.  Eyes: Negative for blurred vision.  Respiratory: Positive for shortness of breath.   Cardiovascular: Negative for chest pain, palpitations, orthopnea and PND.  Musculoskeletal: Negative for neck pain.  Neurological: Negative for headaches.    Per HPI unless specifically indicated above     Objective:    BP (!) 161/97   Pulse (!) 110   Temp 99 F (37.2 C) (Oral)   Ht 5\' 11"  (1.803 m)   Wt 268  lb (121.6 kg)   SpO2 95%   BMI 37.38 kg/m   Wt Readings from Last 3 Encounters:  05/15/20 268 lb (121.6 kg)  05/08/20 273 lb (123.8 kg)  05/06/20 273 lb (123.8 kg)    Physical Exam Vitals and nursing note reviewed.  Constitutional:      General: He is not in acute distress.    Appearance: Normal appearance. He is not ill-appearing or diaphoretic.  HENT:     Head: Normocephalic and atraumatic.     Right Ear: Tympanic membrane and external ear normal. There is no impacted cerumen.     Left Ear: External ear normal.     Nose: No congestion or rhinorrhea.     Mouth/Throat:     Pharynx: No oropharyngeal exudate or posterior oropharyngeal erythema.  Eyes:     Conjunctiva/sclera: Conjunctivae normal.     Pupils: Pupils are equal, round, and reactive to light.  Cardiovascular:     Rate and Rhythm: Normal rate and regular rhythm.     Heart sounds: No murmur heard. No friction rub. No gallop.   Pulmonary:     Effort: No respiratory distress.     Breath sounds: No stridor. Examination of the right-middle field reveals decreased breath sounds and wheezing. Examination of the left-middle field reveals decreased breath sounds and wheezing. Examination of the right-lower field reveals decreased breath sounds. Examination of the left-lower field reveals decreased breath sounds. Decreased breath  sounds and wheezing present.  Chest:     Chest wall: No mass or tenderness.  Abdominal:     General: Abdomen is flat. Bowel sounds are normal. There is no distension.     Palpations: Abdomen is soft. There is no mass.     Tenderness: There is no abdominal tenderness. There is no guarding.  Musculoskeletal:        General: No swelling or deformity.     Cervical back: Normal range of motion and neck supple. No rigidity or tenderness.     Right lower leg: No edema.     Left lower leg: No edema.  Skin:    General: Skin is warm and dry.     Coloration: Skin is not jaundiced.     Findings: No erythema.   Neurological:     Mental Status: He is alert. Mental status is at baseline. He is disoriented.  Psychiatric:        Mood and Affect: Mood normal.        Behavior: Behavior normal.        Thought Content: Thought content normal.        Judgment: Judgment normal.     Results for orders placed or performed in visit on 04/17/20  Glucose Hemocue Waived (STAT)  Result Value Ref Range   Glu Hemocue Waived 175 (H) 65 - 99 mg/dL  Basic Metabolic Panel (BMET)  Result Value Ref Range   Glucose 194 (H) 65 - 99 mg/dL   BUN 11 8 - 27 mg/dL   Creatinine, Ser 1.08 0.76 - 1.27 mg/dL   GFR calc non Af Amer 64 >59 mL/min/1.73   GFR calc Af Amer 75 >59 mL/min/1.73   BUN/Creatinine Ratio 10 10 - 24   Sodium 137 134 - 144 mmol/L   Potassium 4.4 3.5 - 5.2 mmol/L   Chloride 102 96 - 106 mmol/L   CO2 20 20 - 29 mmol/L   Calcium 10.8 (H) 8.6 - 10.2 mg/dL      Assessment & Plan:   1.  Diminished breath sounds questionably secondary to COPD / emphysema long term ho smoking.  Will start pt on Spiriva, add advair as well Will need solumedrol x 1 in clinic  Pt very SOB.  Will need to rtc in 2 weeks for fu. Will start pt on duonebs x 2 in clinic.  Stop qvar. Consider anoro next visit. Pt continues to be hypoxic at 92 - 93  % shortness of breath probably secondary to cardiac etiology G as below patient will be seen in their office tomorrow morning  2. DM fu and mx per endo recheck labs is on lantus 25 untis q pm, metformin 1000 mg bid,  Will need to check labs 3, HTN : uncontrolled up at 158/90 mm hg on multiple meds which need to be addressed per patient he has had 2 medicines that were stopped and has not been feeling well ever since then per his verbal record home readings are anywhere in the 694W systolic Will need to see cards.  D/w Dr. Gabriel Earing - will see pt tomorrow am.  BP meds need adjustment.  Patient has CHF. Probably check echo patient was very short of breath at this visit Pt advised to  go to ER if he develops further worsening of his breathlessness/increased wheezing or fatigue.    Problem List Items Addressed This Visit   None     Follow up plan: No follow-ups on file.

## 2020-05-16 ENCOUNTER — Inpatient Hospital Stay
Admission: EM | Admit: 2020-05-16 | Discharge: 2020-05-21 | DRG: 246 | Disposition: A | Discharge status: Home Health | Payer: Medicare Other | Attending: Internal Medicine | Admitting: Internal Medicine

## 2020-05-16 ENCOUNTER — Emergency Department: Payer: Medicare Other

## 2020-05-16 ENCOUNTER — Other Ambulatory Visit: Payer: Self-pay

## 2020-05-16 DIAGNOSIS — I639 Cerebral infarction, unspecified: Secondary | ICD-10-CM

## 2020-05-16 DIAGNOSIS — T8383XA Hemorrhage of genitourinary prosthetic devices, implants and grafts, initial encounter: Secondary | ICD-10-CM | POA: Diagnosis not present

## 2020-05-16 DIAGNOSIS — Z794 Long term (current) use of insulin: Secondary | ICD-10-CM

## 2020-05-16 DIAGNOSIS — Z8673 Personal history of transient ischemic attack (TIA), and cerebral infarction without residual deficits: Secondary | ICD-10-CM | POA: Diagnosis not present

## 2020-05-16 DIAGNOSIS — I6340 Cerebral infarction due to embolism of unspecified cerebral artery: Secondary | ICD-10-CM | POA: Diagnosis not present

## 2020-05-16 DIAGNOSIS — Z791 Long term (current) use of non-steroidal anti-inflammatories (NSAID): Secondary | ICD-10-CM

## 2020-05-16 DIAGNOSIS — Z20822 Contact with and (suspected) exposure to covid-19: Secondary | ICD-10-CM | POA: Diagnosis present

## 2020-05-16 DIAGNOSIS — Z7951 Long term (current) use of inhaled steroids: Secondary | ICD-10-CM | POA: Diagnosis not present

## 2020-05-16 DIAGNOSIS — Z9049 Acquired absence of other specified parts of digestive tract: Secondary | ICD-10-CM

## 2020-05-16 DIAGNOSIS — I214 Non-ST elevation (NSTEMI) myocardial infarction: Principal | ICD-10-CM | POA: Diagnosis present

## 2020-05-16 DIAGNOSIS — Z87891 Personal history of nicotine dependence: Secondary | ICD-10-CM | POA: Diagnosis not present

## 2020-05-16 DIAGNOSIS — Z79899 Other long term (current) drug therapy: Secondary | ICD-10-CM

## 2020-05-16 DIAGNOSIS — Z7982 Long term (current) use of aspirin: Secondary | ICD-10-CM

## 2020-05-16 DIAGNOSIS — I2109 ST elevation (STEMI) myocardial infarction involving other coronary artery of anterior wall: Secondary | ICD-10-CM | POA: Diagnosis not present

## 2020-05-16 DIAGNOSIS — G4733 Obstructive sleep apnea (adult) (pediatric): Secondary | ICD-10-CM | POA: Diagnosis not present

## 2020-05-16 DIAGNOSIS — E1151 Type 2 diabetes mellitus with diabetic peripheral angiopathy without gangrene: Secondary | ICD-10-CM | POA: Diagnosis present

## 2020-05-16 DIAGNOSIS — I429 Cardiomyopathy, unspecified: Secondary | ICD-10-CM | POA: Diagnosis present

## 2020-05-16 DIAGNOSIS — E1159 Type 2 diabetes mellitus with other circulatory complications: Secondary | ICD-10-CM | POA: Diagnosis present

## 2020-05-16 DIAGNOSIS — I2510 Atherosclerotic heart disease of native coronary artery without angina pectoris: Secondary | ICD-10-CM | POA: Diagnosis present

## 2020-05-16 DIAGNOSIS — R001 Bradycardia, unspecified: Secondary | ICD-10-CM | POA: Diagnosis not present

## 2020-05-16 DIAGNOSIS — N179 Acute kidney failure, unspecified: Secondary | ICD-10-CM | POA: Diagnosis not present

## 2020-05-16 DIAGNOSIS — R0602 Shortness of breath: Secondary | ICD-10-CM | POA: Diagnosis not present

## 2020-05-16 DIAGNOSIS — I5033 Acute on chronic diastolic (congestive) heart failure: Secondary | ICD-10-CM | POA: Diagnosis not present

## 2020-05-16 DIAGNOSIS — J9811 Atelectasis: Secondary | ICD-10-CM | POA: Diagnosis not present

## 2020-05-16 DIAGNOSIS — J439 Emphysema, unspecified: Secondary | ICD-10-CM | POA: Diagnosis present

## 2020-05-16 DIAGNOSIS — K219 Gastro-esophageal reflux disease without esophagitis: Secondary | ICD-10-CM | POA: Diagnosis present

## 2020-05-16 DIAGNOSIS — Z9861 Coronary angioplasty status: Secondary | ICD-10-CM | POA: Diagnosis not present

## 2020-05-16 DIAGNOSIS — E876 Hypokalemia: Secondary | ICD-10-CM | POA: Diagnosis not present

## 2020-05-16 DIAGNOSIS — I6523 Occlusion and stenosis of bilateral carotid arteries: Secondary | ICD-10-CM | POA: Diagnosis not present

## 2020-05-16 DIAGNOSIS — I9589 Other hypotension: Secondary | ICD-10-CM | POA: Diagnosis not present

## 2020-05-16 DIAGNOSIS — E669 Obesity, unspecified: Secondary | ICD-10-CM | POA: Diagnosis not present

## 2020-05-16 DIAGNOSIS — E1169 Type 2 diabetes mellitus with other specified complication: Secondary | ICD-10-CM | POA: Diagnosis present

## 2020-05-16 DIAGNOSIS — I63413 Cerebral infarction due to embolism of bilateral middle cerebral arteries: Secondary | ICD-10-CM | POA: Diagnosis not present

## 2020-05-16 DIAGNOSIS — E119 Type 2 diabetes mellitus without complications: Secondary | ICD-10-CM | POA: Diagnosis not present

## 2020-05-16 DIAGNOSIS — R310 Gross hematuria: Secondary | ICD-10-CM | POA: Diagnosis present

## 2020-05-16 DIAGNOSIS — T502X5A Adverse effect of carbonic-anhydrase inhibitors, benzothiadiazides and other diuretics, initial encounter: Secondary | ICD-10-CM | POA: Diagnosis not present

## 2020-05-16 DIAGNOSIS — D72829 Elevated white blood cell count, unspecified: Secondary | ICD-10-CM | POA: Diagnosis present

## 2020-05-16 DIAGNOSIS — Z7984 Long term (current) use of oral hypoglycemic drugs: Secondary | ICD-10-CM | POA: Diagnosis not present

## 2020-05-16 DIAGNOSIS — R079 Chest pain, unspecified: Secondary | ICD-10-CM | POA: Diagnosis not present

## 2020-05-16 DIAGNOSIS — Z85038 Personal history of other malignant neoplasm of large intestine: Secondary | ICD-10-CM

## 2020-05-16 DIAGNOSIS — G9341 Metabolic encephalopathy: Secondary | ICD-10-CM | POA: Diagnosis not present

## 2020-05-16 DIAGNOSIS — I10 Essential (primary) hypertension: Secondary | ICD-10-CM | POA: Diagnosis not present

## 2020-05-16 DIAGNOSIS — Z125 Encounter for screening for malignant neoplasm of prostate: Secondary | ICD-10-CM | POA: Diagnosis not present

## 2020-05-16 DIAGNOSIS — I152 Hypertension secondary to endocrine disorders: Secondary | ICD-10-CM | POA: Diagnosis present

## 2020-05-16 DIAGNOSIS — I350 Nonrheumatic aortic (valve) stenosis: Secondary | ICD-10-CM | POA: Diagnosis not present

## 2020-05-16 DIAGNOSIS — Z6837 Body mass index (BMI) 37.0-37.9, adult: Secondary | ICD-10-CM

## 2020-05-16 DIAGNOSIS — I252 Old myocardial infarction: Secondary | ICD-10-CM | POA: Diagnosis present

## 2020-05-16 DIAGNOSIS — I110 Hypertensive heart disease with heart failure: Secondary | ICD-10-CM | POA: Diagnosis present

## 2020-05-16 DIAGNOSIS — Y846 Urinary catheterization as the cause of abnormal reaction of the patient, or of later complication, without mention of misadventure at the time of the procedure: Secondary | ICD-10-CM | POA: Diagnosis not present

## 2020-05-16 DIAGNOSIS — E782 Mixed hyperlipidemia: Secondary | ICD-10-CM | POA: Diagnosis not present

## 2020-05-16 DIAGNOSIS — I5023 Acute on chronic systolic (congestive) heart failure: Secondary | ICD-10-CM | POA: Diagnosis not present

## 2020-05-16 DIAGNOSIS — R809 Proteinuria, unspecified: Secondary | ICD-10-CM | POA: Diagnosis present

## 2020-05-16 DIAGNOSIS — E1129 Type 2 diabetes mellitus with other diabetic kidney complication: Secondary | ICD-10-CM | POA: Diagnosis present

## 2020-05-16 DIAGNOSIS — R0609 Other forms of dyspnea: Secondary | ICD-10-CM | POA: Diagnosis not present

## 2020-05-16 DIAGNOSIS — Z833 Family history of diabetes mellitus: Secondary | ICD-10-CM | POA: Diagnosis not present

## 2020-05-16 DIAGNOSIS — J449 Chronic obstructive pulmonary disease, unspecified: Secondary | ICD-10-CM | POA: Diagnosis not present

## 2020-05-16 DIAGNOSIS — Z96653 Presence of artificial knee joint, bilateral: Secondary | ICD-10-CM | POA: Diagnosis present

## 2020-05-16 DIAGNOSIS — I200 Unstable angina: Secondary | ICD-10-CM | POA: Diagnosis not present

## 2020-05-16 DIAGNOSIS — D72828 Other elevated white blood cell count: Secondary | ICD-10-CM | POA: Diagnosis present

## 2020-05-16 DIAGNOSIS — H9190 Unspecified hearing loss, unspecified ear: Secondary | ICD-10-CM | POA: Diagnosis present

## 2020-05-16 DIAGNOSIS — E785 Hyperlipidemia, unspecified: Secondary | ICD-10-CM | POA: Diagnosis present

## 2020-05-16 DIAGNOSIS — N401 Enlarged prostate with lower urinary tract symptoms: Secondary | ICD-10-CM | POA: Diagnosis present

## 2020-05-16 DIAGNOSIS — R4182 Altered mental status, unspecified: Secondary | ICD-10-CM | POA: Diagnosis not present

## 2020-05-16 DIAGNOSIS — I5032 Chronic diastolic (congestive) heart failure: Secondary | ICD-10-CM | POA: Diagnosis present

## 2020-05-16 DIAGNOSIS — I739 Peripheral vascular disease, unspecified: Secondary | ICD-10-CM | POA: Diagnosis not present

## 2020-05-16 LAB — BASIC METABOLIC PANEL
Anion gap: 11 (ref 5–15)
BUN: 13 mg/dL (ref 8–23)
CO2: 22 mmol/L (ref 22–32)
Calcium: 10.7 mg/dL — ABNORMAL HIGH (ref 8.9–10.3)
Chloride: 100 mmol/L (ref 98–111)
Creatinine, Ser: 1.03 mg/dL (ref 0.61–1.24)
GFR, Estimated: >60 mL/min (ref >60)
Glucose, Bld: 170 mg/dL — ABNORMAL HIGH (ref 70–99)
Potassium: 3.7 mmol/L (ref 3.5–5.1)
Sodium: 133 mmol/L — ABNORMAL LOW (ref 135–145)

## 2020-05-16 LAB — SARS CORONAVIRUS 2 (TAT 6-24 HRS): SARS Coronavirus 2: NEGATIVE

## 2020-05-16 LAB — CBC
HCT: 42.4 % (ref 39.0–52.0)
Hemoglobin: 14.8 g/dL (ref 13.0–17.0)
MCH: 30.8 pg (ref 26.0–34.0)
MCHC: 34.9 g/dL (ref 30.0–36.0)
MCV: 88.3 fL (ref 80.0–100.0)
Platelets: 227 10*3/uL (ref 150–400)
RBC: 4.8 MIL/uL (ref 4.22–5.81)
RDW: 13 % (ref 11.5–15.5)
WBC: 14.6 10*3/uL — ABNORMAL HIGH (ref 4.0–10.5)
nRBC: 0 % (ref 0.0–0.2)

## 2020-05-16 LAB — PROTIME-INR
INR: 1.1 (ref 0.8–1.2)
Prothrombin Time: 13.8 seconds (ref 11.4–15.2)

## 2020-05-16 LAB — APTT: aPTT: 25 seconds (ref 24–36)

## 2020-05-16 LAB — TROPONIN I (HIGH SENSITIVITY)
Troponin I (High Sensitivity): >27000 ng/L (ref <18)
Troponin I (High Sensitivity): >27000 ng/L (ref <18)

## 2020-05-16 LAB — GLUCOSE, CAPILLARY
Glucose-Capillary: 126 mg/dL — ABNORMAL HIGH (ref 70–99)
Glucose-Capillary: 157 mg/dL — ABNORMAL HIGH (ref 70–99)

## 2020-05-16 LAB — HEPARIN LEVEL (UNFRACTIONATED): Heparin Unfractionated: 0.15 IU/mL — ABNORMAL LOW (ref 0.30–0.70)

## 2020-05-16 LAB — BRAIN NATRIURETIC PEPTIDE: B Natriuretic Peptide: 339.4 pg/mL — ABNORMAL HIGH (ref 0.0–100.0)

## 2020-05-16 MED ORDER — CLOBETASOL PROPIONATE 0.05 % EX CREA
1.0000 "application " | TOPICAL_CREAM | Freq: Two times a day (BID) | CUTANEOUS | Status: DC
  Administered 2020-05-16 – 2020-05-20 (×6): 1 via TOPICAL
  Filled 2020-05-16 (×2): qty 15

## 2020-05-16 MED ORDER — FUROSEMIDE 20 MG PO TABS
20.0000 mg | ORAL_TABLET | Freq: Every day | ORAL | Status: DC
  Administered 2020-05-18: 20 mg via ORAL
  Filled 2020-05-16: qty 1

## 2020-05-16 MED ORDER — INSULIN ASPART 100 UNIT/ML ~~LOC~~ SOLN
0.0000 [IU] | Freq: Three times a day (TID) | SUBCUTANEOUS | Status: DC
  Administered 2020-05-16: 1 [IU] via SUBCUTANEOUS
  Administered 2020-05-17: 3 [IU] via SUBCUTANEOUS
  Administered 2020-05-18: 1 [IU] via SUBCUTANEOUS
  Administered 2020-05-18: 2 [IU] via SUBCUTANEOUS
  Administered 2020-05-18: 3 [IU] via SUBCUTANEOUS
  Administered 2020-05-19: 2 [IU] via SUBCUTANEOUS
  Administered 2020-05-19: 3 [IU] via SUBCUTANEOUS
  Administered 2020-05-19: 1 [IU] via SUBCUTANEOUS
  Administered 2020-05-20: 2 [IU] via SUBCUTANEOUS
  Administered 2020-05-20: 1 [IU] via SUBCUTANEOUS
  Administered 2020-05-20 – 2020-05-21 (×2): 2 [IU] via SUBCUTANEOUS
  Filled 2020-05-16 (×11): qty 1

## 2020-05-16 MED ORDER — MUPIROCIN 2 % EX OINT
1.0000 "application " | TOPICAL_OINTMENT | Freq: Two times a day (BID) | CUTANEOUS | Status: DC
  Administered 2020-05-16 – 2020-05-20 (×5): 1 via TOPICAL
  Filled 2020-05-16 (×2): qty 22

## 2020-05-16 MED ORDER — DM-GUAIFENESIN ER 30-600 MG PO TB12: 1.0000 | ORAL_TABLET | Freq: Two times a day (BID) | ORAL | Status: DC | PRN

## 2020-05-16 MED ORDER — ALBUTEROL SULFATE HFA 108 (90 BASE) MCG/ACT IN AERS
2.0000 | INHALATION_SPRAY | RESPIRATORY_TRACT | Status: DC | PRN
  Administered 2020-05-17: 2 via RESPIRATORY_TRACT
  Filled 2020-05-16 (×2): qty 6.7

## 2020-05-16 MED ORDER — ASPIRIN EC 325 MG PO TBEC: 325.0000 mg | DELAYED_RELEASE_TABLET | Freq: Every day | ORAL | Status: DC

## 2020-05-16 MED ORDER — HEPARIN (PORCINE) 25000 UT/250ML-% IV SOLN
1700.0000 [IU]/h | INTRAVENOUS | Status: DC
  Administered 2020-05-16 (×2): 1400 [IU]/h via INTRAVENOUS
  Administered 2020-05-17: 1700 [IU]/h via INTRAVENOUS
  Filled 2020-05-16 (×2): qty 250

## 2020-05-16 MED ORDER — ACYCLOVIR 200 MG PO CAPS
400.0000 mg | ORAL_CAPSULE | Freq: Two times a day (BID) | ORAL | Status: DC
  Administered 2020-05-16 – 2020-05-21 (×9): 400 mg via ORAL
  Filled 2020-05-16 (×10): qty 2

## 2020-05-16 MED ORDER — HEPARIN BOLUS VIA INFUSION
3000.0000 [IU] | Freq: Once | INTRAVENOUS | Status: AC
  Administered 2020-05-16: 3000 [IU] via INTRAVENOUS
  Filled 2020-05-16: qty 3000

## 2020-05-16 MED ORDER — NITROGLYCERIN 0.4 MG SL SUBL
0.4000 mg | SUBLINGUAL_TABLET | SUBLINGUAL | Status: DC | PRN
  Administered 2020-05-18: 0.4 mg via SUBLINGUAL
  Filled 2020-05-16: qty 1

## 2020-05-16 MED ORDER — INSULIN GLARGINE 100 UNIT/ML ~~LOC~~ SOLN
15.0000 [IU] | Freq: Every day | SUBCUTANEOUS | Status: DC
  Administered 2020-05-16 – 2020-05-20 (×5): 15 [IU] via SUBCUTANEOUS
  Filled 2020-05-16 (×6): qty 0.15

## 2020-05-16 MED ORDER — NEOMYCIN-POLYMYXIN-DEXAMETH 3.5-10000-0.1 OP SUSP
2.0000 [drp] | Freq: Two times a day (BID) | OPHTHALMIC | Status: DC
  Administered 2020-05-16 – 2020-05-21 (×9): 2 [drp] via OPHTHALMIC
  Filled 2020-05-16 (×2): qty 5

## 2020-05-16 MED ORDER — SODIUM CHLORIDE 0.9 % IV SOLN: INTRAVENOUS | Status: DC

## 2020-05-16 MED ORDER — TAMSULOSIN HCL 0.4 MG PO CAPS
0.4000 mg | ORAL_CAPSULE | Freq: Every day | ORAL | Status: DC
  Administered 2020-05-17 – 2020-05-21 (×5): 0.4 mg via ORAL
  Filled 2020-05-16 (×5): qty 1

## 2020-05-16 MED ORDER — CLOTRIMAZOLE 1 % EX CREA
TOPICAL_CREAM | Freq: Two times a day (BID) | CUTANEOUS | Status: DC
  Filled 2020-05-16 (×2): qty 15

## 2020-05-16 MED ORDER — INSULIN ASPART 100 UNIT/ML ~~LOC~~ SOLN
0.0000 [IU] | Freq: Every day | SUBCUTANEOUS | Status: DC
  Filled 2020-05-16: qty 1

## 2020-05-16 MED ORDER — ACETAMINOPHEN 325 MG PO TABS
650.0000 mg | ORAL_TABLET | Freq: Four times a day (QID) | ORAL | Status: DC | PRN
  Administered 2020-05-20: 650 mg via ORAL
  Filled 2020-05-16: qty 2

## 2020-05-16 MED ORDER — CLONIDINE HCL 0.1 MG PO TABS
0.1000 mg | ORAL_TABLET | Freq: Two times a day (BID) | ORAL | Status: DC
  Administered 2020-05-16 – 2020-05-18 (×3): 0.1 mg via ORAL
  Filled 2020-05-16 (×3): qty 1

## 2020-05-16 MED ORDER — PRIMIDONE 50 MG PO TABS
50.0000 mg | ORAL_TABLET | Freq: Two times a day (BID) | ORAL | Status: DC
  Administered 2020-05-16 – 2020-05-21 (×9): 50 mg via ORAL
  Filled 2020-05-16 (×11): qty 1

## 2020-05-16 MED ORDER — VITAMIN D 25 MCG (1000 UNIT) PO TABS
2000.0000 [IU] | ORAL_TABLET | Freq: Every day | ORAL | Status: DC
  Administered 2020-05-16 – 2020-05-20 (×5): 2000 [IU] via ORAL
  Filled 2020-05-16 (×5): qty 2

## 2020-05-16 MED ORDER — TIOTROPIUM BROMIDE MONOHYDRATE 2.5 MCG/ACT IN AERS: 2.0000 | INHALATION_SPRAY | Freq: Every day | RESPIRATORY_TRACT | Status: DC

## 2020-05-16 MED ORDER — SODIUM CHLORIDE 0.9 % IV SOLN: 250.0000 mL | INTRAVENOUS | Status: DC | PRN

## 2020-05-16 MED ORDER — ASPIRIN EC 81 MG PO TBEC
81.0000 mg | DELAYED_RELEASE_TABLET | Freq: Every day | ORAL | Status: DC
  Administered 2020-05-18 – 2020-05-21 (×3): 81 mg via ORAL
  Filled 2020-05-16 (×4): qty 1

## 2020-05-16 MED ORDER — HEPARIN BOLUS VIA INFUSION
4000.0000 [IU] | Freq: Once | INTRAVENOUS | Status: AC
  Administered 2020-05-16: 4000 [IU] via INTRAVENOUS
  Filled 2020-05-16: qty 4000

## 2020-05-16 MED ORDER — IPRATROPIUM-ALBUTEROL 0.5-2.5 (3) MG/3ML IN SOLN
3.0000 mL | Freq: Four times a day (QID) | RESPIRATORY_TRACT | Status: DC
  Administered 2020-05-16: 3 mL via RESPIRATORY_TRACT
  Filled 2020-05-16: qty 3

## 2020-05-16 MED ORDER — IRBESARTAN 150 MG PO TABS
300.0000 mg | ORAL_TABLET | Freq: Every day | ORAL | Status: DC
  Administered 2020-05-17 – 2020-05-18 (×2): 300 mg via ORAL
  Filled 2020-05-16 (×2): qty 2

## 2020-05-16 MED ORDER — MORPHINE SULFATE (PF) 2 MG/ML IV SOLN
0.5000 mg | INTRAVENOUS | Status: DC | PRN
  Administered 2020-05-17: 0.5 mg via INTRAVENOUS
  Filled 2020-05-16: qty 1

## 2020-05-16 MED ORDER — METHYLPREDNISOLONE SODIUM SUCC 40 MG IJ SOLR
80.0000 mg | Freq: Once | INTRAMUSCULAR | Status: AC
  Administered 2020-05-16: 80 mg via INTRAMUSCULAR

## 2020-05-16 MED ORDER — DONEPEZIL HCL 5 MG PO TABS
5.0000 mg | ORAL_TABLET | Freq: Every day | ORAL | Status: DC
  Administered 2020-05-16 – 2020-05-20 (×5): 5 mg via ORAL
  Filled 2020-05-16 (×6): qty 1

## 2020-05-16 MED ORDER — IPRATROPIUM-ALBUTEROL 0.5-2.5 (3) MG/3ML IN SOLN
3.0000 mL | Freq: Two times a day (BID) | RESPIRATORY_TRACT | Status: DC
  Administered 2020-05-17: 3 mL via RESPIRATORY_TRACT

## 2020-05-16 MED ORDER — HYDRALAZINE HCL 20 MG/ML IJ SOLN
5.0000 mg | INTRAMUSCULAR | Status: DC | PRN
  Administered 2020-05-17 – 2020-05-21 (×3): 5 mg via INTRAVENOUS
  Filled 2020-05-16 (×3): qty 1

## 2020-05-16 MED ORDER — CYANOCOBALAMIN 500 MCG PO TABS
2000.0000 ug | ORAL_TABLET | Freq: Every day | ORAL | Status: DC
  Administered 2020-05-16 – 2020-05-20 (×5): 2000 ug via ORAL
  Filled 2020-05-16 (×6): qty 4

## 2020-05-16 MED ORDER — ROSUVASTATIN CALCIUM 10 MG PO TABS
40.0000 mg | ORAL_TABLET | Freq: Every day | ORAL | Status: DC
Start: 2020-05-16 — End: 2020-05-21
  Administered 2020-05-16 – 2020-05-20 (×5): 40 mg via ORAL
  Filled 2020-05-16 (×5): qty 4

## 2020-05-16 MED ORDER — SODIUM CHLORIDE 0.9% FLUSH: 3.0000 mL | INTRAVENOUS | Status: DC | PRN

## 2020-05-16 MED ORDER — ONDANSETRON HCL 4 MG/2ML IJ SOLN: 4.0000 mg | Freq: Three times a day (TID) | INTRAMUSCULAR | Status: DC | PRN

## 2020-05-16 MED ORDER — CARVEDILOL 25 MG PO TABS
25.0000 mg | ORAL_TABLET | Freq: Two times a day (BID) | ORAL | Status: DC
  Administered 2020-05-17 – 2020-05-18 (×2): 25 mg via ORAL
  Filled 2020-05-16 (×2): qty 1

## 2020-05-16 MED ORDER — ASPIRIN 81 MG PO CHEW
81.0000 mg | CHEWABLE_TABLET | ORAL | Status: AC
  Administered 2020-05-17: 81 mg via ORAL
  Filled 2020-05-16: qty 1

## 2020-05-16 MED ORDER — SODIUM CHLORIDE 0.9% FLUSH
3.0000 mL | Freq: Two times a day (BID) | INTRAVENOUS | Status: DC
  Administered 2020-05-16 – 2020-05-20 (×8): 3 mL via INTRAVENOUS

## 2020-05-16 MED ORDER — TIOTROPIUM BROMIDE MONOHYDRATE 18 MCG IN CAPS
18.0000 ug | ORAL_CAPSULE | Freq: Every day | RESPIRATORY_TRACT | Status: DC
  Administered 2020-05-17 – 2020-05-20 (×4): 18 ug via RESPIRATORY_TRACT
  Filled 2020-05-16 (×2): qty 5

## 2020-05-16 MED ORDER — ASPIRIN 81 MG PO CHEW
324.0000 mg | CHEWABLE_TABLET | Freq: Once | ORAL | Status: AC
  Administered 2020-05-16: 324 mg via ORAL
  Filled 2020-05-16: qty 4

## 2020-05-16 MED ORDER — SODIUM CHLORIDE 0.9 % WEIGHT BASED INFUSION: 3.0000 mL/kg/h | INTRAVENOUS | Status: AC

## 2020-05-16 MED ORDER — SODIUM CHLORIDE 0.9 % WEIGHT BASED INFUSION
1.0000 mL/kg/h | INTRAVENOUS | Status: DC
  Administered 2020-05-16: 1 mL/kg/h via INTRAVENOUS

## 2020-05-16 NOTE — ED Triage Notes (Signed)
Pt sent from Dr. Marianna Payment office, pt c/o having chest pain all evening yesterday . Denies any pain at present, sent for further testing and eval..

## 2020-05-16 NOTE — Consult Note (Signed)
ANTICOAGULATION CONSULT NOTE - Initial Consult  Pharmacy Consult for heparin infusion Indication: chest pain/ACS  Allergies  Allergen Reactions  . Dulaglutide Diarrhea and Other (See Comments)  . Liraglutide Diarrhea and Other (See Comments)  . Farxiga [Dapagliflozin] Rash  . Jardiance [Empagliflozin] Rash    Patient Measurements: Height: 5\' 10"  (177.8 cm) Weight: 125.2 kg (276 lb) IBW/kg (Calculated) : 73 Heparin Dosing Weight: 101.4 kg   Vital Signs: Temp: 98.2 F (36.8 C) (02/24 1044) Temp Source: Oral (02/24 1044) BP: 163/116 (02/24 1044) Pulse Rate: 106 (02/24 1044)  Labs: Recent Labs    05/16/20 1046  HGB 14.8  HCT 42.4  PLT 227  CREATININE 1.03  TROPONINIHS >27,000*    Estimated Creatinine Clearance: 76 mL/min (by C-G formula based on SCr of 1.03 mg/dL).   Medical History: Past Medical History:  Diagnosis Date  . Arthritis   . Asthma 2000  . Back pain   . Colon polyp 2012  . COPD (chronic obstructive pulmonary disease) ([place])   . Emphysema of lung ([place])   . Heart murmur   . Hernia 2000  . Hyperlipidemia   . Hypertension   . Personal history of malignant neoplasm of large intestine   . Personal history of tobacco use, presenting hazards to health   . Sleep apnea   . Special screening for malignant neoplasms, colon   . Type 2 diabetes mellitus with proteinuria ([place]) 10/11/2014    Medications:  No prior anticoagulation documented   Assessment: 80 y.o. male with PMH significant for T2DM and HTN. Presented to ED from cardiologist office c/o chest pain. Troponin > 27,000. Pharmacy has been consulted for heparin initiation and management for ACS.  Baseline CBC WNL, aPTT, PT/INR ordered  Heparin Dosing Weight: 101.4 kg   Goal of Therapy:  Heparin level 0.3-0.7 units/ml Monitor platelets by anticoagulation protocol: Yes   Plan:  Give 4000 units bolus x 1 Start heparin infusion at 1400 units/hr Check anti-Xa level in 8 hours and daily while on  heparin Continue to monitor H&H and platelets  Dorothe Pea, PharmD, BCPS Clinical Pharmacist  05/16/2020,12:41 PM

## 2020-05-16 NOTE — ED Provider Notes (Signed)
[facility] Emergency Department Provider Note   ____________________________________________    I have reviewed the triage vital signs and the nursing notes.   HISTORY  Chief Complaint Chest Pain     HPI Justin Griffith is a 80 y.o. male with history of COPD, hyperlipidemia, hypertension, diabetes who presents after an episode of chest pain yesterday.  Patient reports after he ate dinner with friends he developed chest pain while driving home.  He reports severe substernal chest pain with radiation to the left shoulder.  He used an albuterol treatment at home and eventually the chest pain seemed to subside.  He reports he still has mild chest discomfort today although much better than yesterday.  He reports his breathing is slightly worsened with exertion.  He went to his cardiologist today who sent him to the emergency department.  Past Medical History:  Diagnosis Date   Arthritis    Asthma 2000   Back pain    Colon polyp 2012   COPD (chronic obstructive pulmonary disease) (HCC)    Emphysema of lung ([place])    Heart murmur    Hernia 2000   Hyperlipidemia    Hypertension    Personal history of malignant neoplasm of large intestine    Personal history of tobacco use, presenting hazards to health    Sleep apnea    Special screening for malignant neoplasms, colon    Type 2 diabetes mellitus with proteinuria ([place]) 10/11/2014    Patient Active Problem List   Diagnosis Date Noted   At high risk for falls 05/08/2020   History of cold sores 05/06/2020   NSTEMI (non-ST elevated myocardial infarction) ([place]) 03/28/2020   Elevated TSH 02/25/2020   Poor mobility 02/02/2020   Coronary artery disease 01/23/2020   History of CVA (cerebrovascular accident) 01/23/2020   Memory changes 01/01/2020   Chronic venous insufficiency 05/23/2019   B12 deficiency 04/23/2019   Constipation 04/21/2019   Tremor 11/14/2018   Morbid obesity  ([place]) 09/07/2017   COPD (chronic obstructive pulmonary disease) ([place]) 04/29/2017   Intertrigo 02/02/2017   BPH (benign prostatic hyperplasia) 07/23/2016   Advanced care planning/counseling discussion 02/63/7858   Eosinophilic esophagitis 85/04/7739   PAD (peripheral artery disease) ([place]) 01/08/2016   Gastroesophageal reflux disease without esophagitis 01/06/2016   Hyperlipidemia associated with type 2 diabetes mellitus ([place]) 01/16/2015   Sleep apnea 01/16/2015   BMI 40.0-44.9, adult ([place]) 01/16/2015   Hypertension associated with diabetes ([place]) 10/11/2014   Type 2 diabetes mellitus with proteinuria ([place]) 10/11/2014   Back pain 10/11/2014   History of colon cancer     Past Surgical History:  Procedure Laterality Date   CARDIAC CATHETERIZATION Left 09/25/2015   Procedure: Left Heart Cath and Coronary Angiography;  Surgeon: Yolonda Kida, MD;  Location: [place] [facility];  Service: Cardiovascular;  Laterality: Left;   CHOLECYSTECTOMY  1970   COLON SURGERY  07-16-99   sigmoid colon resection with primary anastomosis, chemotherapy for metastatic disease   COLONOSCOPY  2001, 2012   Dr Bary Castilla, tubular adenoma of the cecum and ascending colon in 2012.   COLONOSCOPY WITH PROPOFOL N/A 02/12/2016   Procedure: COLONOSCOPY WITH PROPOFOL;  Surgeon:  Bellow, MD;  Location: [facility] ENDOSCOPY;  Service: Endoscopy;  Laterality: N/A;   ESOPHAGOGASTRODUODENOSCOPY (EGD) WITH PROPOFOL N/A 02/12/2016   Procedure: ESOPHAGOGASTRODUODENOSCOPY (EGD) WITH PROPOFOL;  Surgeon:  Bellow, MD;  Location: [facility] ENDOSCOPY;  Service: Endoscopy;  Laterality: N/A;   HERNIA REPAIR Right    right  inguinal hernia repair   JOINT REPLACEMENT Bilateral    knee replacement   KNEE SURGERY Bilateral 2010   MYRINGOTOMY WITH TUBE PLACEMENT Bilateral 08/16/2014   Procedure: MYRINGOTOMY WITH TUBE PLACEMENT;  Surgeon: Carloyn Manner, MD;  Location: [facility] ORS;  Service: ENT;  Laterality:  Bilateral;   PERIPHERAL VASCULAR CATHETERIZATION Right 09/04/2015   Procedure: Lower Extremity Angiography;  Surgeon: Katha Cabal, MD;  Location: [place] [facility];  Service: Cardiovascular;  Laterality: Right;   PERIPHERAL VASCULAR CATHETERIZATION  09/04/2015   Procedure: Lower Extremity Intervention;  Surgeon: Katha Cabal, MD;  Location: [place] [facility];  Service: Cardiovascular;;   TONSILLECTOMY     TYMPANOSTOMY TUBE PLACEMENT      Prior to Admission medications   Medication Sig Start Date End Date Taking? Authorizing Provider  acyclovir (ZOVIRAX) 400 MG tablet Take 400 mg by mouth 2 (two) times daily.    [provider]  aspirin EC 325 MG EC tablet Take 1 tablet (325 mg total) by mouth daily. 12/18/19   Raiford Noble Latif, DO  azelastine (ASTELIN) 0.1 % nasal spray Place 2 sprays into both nostrils 2 (two) times daily. Patient not taking: Reported on 05/15/2020 11/22/19   [provider]  bacitracin-neomycin-polymyxin-hydrocortisone (CORTISPORIN) 1 % ophthalmic ointment Place 1 application into both eyes 2 (two) times daily. 05/15/20   Vigg, Avanti, MD  carvedilol (COREG) 12.5 MG tablet Take 2 tablets (25 mg total) by mouth 2 (two) times daily with a meal. 01/12/20   [name], Jolene T, NP  clobetasol cream (TEMOVATE) 7.20 % Apply 1 application topically 2 (two) times daily. Apply to bilateral lower legs. 05/23/19   [name], Henrine Screws T, NP  cloNIDine (CATAPRES) 0.1 MG tablet Take 1 tablet (0.1 mg total) by mouth 2 (two) times daily. 05/08/20 06/07/20  Marnee Guarneri T, NP  clotrimazole-betamethasone (LOTRISONE) cream Apply 1 application topically 2 (two) times daily. 05/15/20   Vigg, Avanti, MD  donepezil (ARICEPT) 5 MG tablet Take 1 tablet (5 mg total) by mouth at bedtime. 01/01/20   [name], Henrine Screws T, NP  furosemide (LASIX) 20 MG tablet Take 1 tablet (20 mg total) by mouth daily. 04/17/20   Myles Gip, DO  gabapentin (NEURONTIN) 300 MG capsule Take 1  capsule by mouth 3 (three) times daily. Patient not taking: Reported on 05/15/2020 04/12/19   [provider]  insulin glargine (LANTUS) 100 UNIT/ML injection Inject 0.25 mLs (25 Units total) into the skin daily. 01/29/20   Elodia Florence., MD  ipratropium-albuterol (DUONEB) 0.5-2.5 (3) MG/3ML SOLN Take 3 mLs by nebulization every 6 (six) hours.    [provider]  meloxicam (MOBIC) 7.5 MG tablet Take 7.5 mg by mouth daily. 04/25/20   [provider]  metFORMIN (GLUCOPHAGE) 1000 MG tablet Take 1,000 mg by mouth 2 (two) times daily with a meal.    [provider]  mupirocin ointment (BACTROBAN) 2 % Apply 1 application topically 2 (two) times daily. To open blisters bilateral legs. 07/29/19   [name], Henrine Screws T, NP  olmesartan (BENICAR) 40 MG tablet Take 1 tablet (40 mg total) by mouth daily. 01/12/20   [name], Henrine Screws T, NP  primidone (MYSOLINE) 50 MG tablet Take 1 tablet (50 mg total) by mouth 2 (two) times daily. 11/28/19   [name], Henrine Screws T, NP  rosuvastatin (CRESTOR) 40 MG tablet Take 1 tablet (40 mg total) by mouth at bedtime. 04/17/20   Myles Gip, DO  tamsulosin (FLOMAX) 0.4 MG CAPS capsule Take 0.4 mg  by mouth daily. Patient restarted on his own due to trouble urinating    [provider]  Tiotropium Bromide Monohydrate (SPIRIVA RESPIMAT) 2.5 MCG/ACT AERS Inhale 2 puffs into the lungs daily. 05/15/20   Vigg, Avanti, MD  vitamin B-12 (CYANOCOBALAMIN) 500 MCG tablet Take 2,000 mcg by mouth at bedtime.    [provider]  Vitamin D, Cholecalciferol, 25 MCG (1000 UT) CAPS Take 2,000 mcg by mouth at bedtime.    [provider]     Allergies Dulaglutide, Liraglutide, Wilder Glade [dapagliflozin], and Jardiance [empagliflozin]  Family History  Problem Relation Age of Onset   Diabetes Father    Esophageal cancer Mother    Alzheimer's disease Paternal Uncle     Social History Social History   Tobacco Use   Smoking status:  Former Smoker    Packs/day: 1.00    Years: 30.00    Pack years: 30.00    Types: Cigarettes    Quit date: 03/23/1989    Years since quitting: 31.1   Smokeless tobacco: Never Used  Scientific laboratory technician Use: Never used  Substance Use Topics   Alcohol use: No    Alcohol/week: 0.0 standard drinks   Drug use: No    Review of Systems  Constitutional: No fever/chills Eyes: No visual changes.  ENT: No sore throat. Cardiovascular: As above Respiratory: As above Gastrointestinal: No abdominal pain.  No nausea, no vomiting.   Genitourinary: Negative for dysuria. Musculoskeletal: Negative for back pain. Skin: Negative for rash. Neurological: Negative for headaches or weakness   ____________________________________________   PHYSICAL EXAM:  VITAL SIGNS: ED Triage Vitals  Enc Vitals Group     BP 05/16/20 1044 (!) 163/116     Pulse Rate 05/16/20 1044 (!) 106     Resp 05/16/20 1044 20     Temp 05/16/20 1044 98.2 F (36.8 C)     Temp Source 05/16/20 1044 Oral     SpO2 05/16/20 1044 95 %     Weight 05/16/20 1045 125.2 kg (276 lb)     Height 05/16/20 1045 1.778 m (5\' 10" )     Head Circumference --      Peak Flow --      Pain Score 05/16/20 1053 0     Pain Loc --      Pain Edu? --      Excl. in [place]? --     Constitutional: Alert and oriented.   Nose: No congestion/rhinnorhea. Mouth/Throat: Mucous membranes are moist.    Cardiovascular: Normal rate, regular rhythm. Grossly normal heart sounds.  Good peripheral circulation. Respiratory: Normal respiratory effort.  No retractions. Lungs CTAB. Gastrointestinal: Soft and nontender. No distention.  No CVA tenderness.  Musculoskeletal: No lower extremity tenderness nor edema.  Warm and well perfused Neurologic:  Normal speech and language. No gross focal neurologic deficits are appreciated.  Skin:  Skin is warm, dry and intact. No rash noted. Psychiatric: Mood and affect are normal. Speech and behavior are  normal.  ____________________________________________   LABS (all labs ordered are listed, but only abnormal results are displayed)  Labs Reviewed  BASIC METABOLIC PANEL - Abnormal; Notable for the following components:      Result Value   Sodium 133 (*)    Glucose, Bld 170 (*)    Calcium 10.7 (*)    All other components within normal limits  CBC - Abnormal; Notable for the following components:   WBC 14.6 (*)    All other components within normal limits  TROPONIN  I (HIGH SENSITIVITY) - Abnormal; Notable for the following components:   Troponin I (High Sensitivity) >27,000 (*)    All other components within normal limits  TROPONIN I (HIGH SENSITIVITY) - Abnormal; Notable for the following components:   Troponin I (High Sensitivity) >27,000 (*)    All other components within normal limits  SARS CORONAVIRUS 2 (TAT 6-24 HRS)  APTT  PROTIME-INR  HEPARIN LEVEL (UNFRACTIONATED)   ____________________________________________  EKG  ED ECG REPORT I, Lavonia Drafts, the attending physician, personally viewed and interpreted this ECG.  Date: 05/16/2020  Rhythm: normal sinus rhythm QRS Axis: normal Intervals: normal ST/T Wave abnormalities: Nonspecific changes Narrative Interpretation: no evidence of acute ischemia  ____________________________________________  RADIOLOGY  Chest x-ray viewed by me, no infiltrate or effusion ____________________________________________   PROCEDURES  Procedure(s) performed: No  Procedures   Critical Care performed:tyes CRITICAL CARE Performed by: Lavonia Drafts   Total critical care time:35 minutes  Critical care time was exclusive of separately billable procedures and treating other patients.  Critical care was necessary to treat or prevent imminent or life-threatening deterioration.  Critical care was time spent personally by me on the following activities: development of treatment plan with patient and/or surrogate as well as  nursing, discussions with consultants, evaluation of patient's response to treatment, examination of patient, obtaining history from patient or surrogate, ordering and performing treatments and interventions, ordering and review of laboratory studies, ordering and review of radiographic studies, pulse oximetry and re-evaluation of patient's condition.  ____________________________________________   INITIAL IMPRESSION / ASSESSMENT AND PLAN / ED COURSE  Pertinent labs & imaging results that were available during my care of the patient were reviewed by me and considered in my medical decision making (see chart for details).  Patient presents with concerning HPI, severe chest pain with radiation to left arm yesterday, mostly improved now.  Sent over by cardiologist.  EKG not significantly changed from prior  However lab work is notable for troponin greater than 27,000, white blood cell count 14.6.  Discussed with [name], agrees with aspirin and heparin drip and admission, he will consult on the patient.  Discussed with hospitalist for admission.    ____________________________________________   FINAL CLINICAL IMPRESSION(S) / ED DIAGNOSES  Final diagnoses:  NSTEMI (non-ST elevated myocardial infarction) [facility]        Note:  This document was prepared using Dragon voice recognition software and may include unintentional dictation errors.   Lavonia Drafts, MD 05/16/20 1355

## 2020-05-16 NOTE — H&P (Signed)
History and Physical    Justin Griffith MBT:[id number] DOB: 04/16/1940 DOA: 05/16/2020  Referring MD/NP/PA:   PCP: Charlynne Cousins, MD   Patient coming from:  The patient is coming from home.  At baseline, pt is independent for most of ADL.        Chief Complaint: chest pain  HPI: Justin Griffith is a 80 y.o. male with medical history significant of CAD, non-STEMI, hypertension, hyperlipidemia, diabetes mellitus, COPD, asthma, stroke, former smoker, eosinophilic esophagitis, colon cancer, PVD, tremor, who presents with chest pain.  Patient states that his chest pain started yesterday evening, which is located in substernal area, pressure-like, constant, moderate to severe, radiating to the left arms.  Patient also has shortness breath.  No cough, fever or chills.  No nausea, vomiting, diarrhea, abdominal, symptoms of UTI.  No unilateral numbness or tingling in his extremities.  Patient was seen by [name] of cardiology in clinic today, and sent to ED for further evaluation and treatment.  ED Course: pt was found to have troponin> 27,000, BNP 339, pending COVID-19 PCR, creatinine 1.03, BUN 13, GFR> 60, temperature normal, blood pressure 163/116, heart rate 124, 106, RR 20, oxygen saturation 92-95% on room air.  Chest x-ray negative.  Patient is admitted to progressive bed as inpatient.  [name] of cardiology is consulted  Review of Systems:   General: no fevers, chills, no body weight gain, has fatigue HEENT: no blurry vision, hearing changes or sore throat Respiratory: has dyspnea, no  coughing, wheezing CV: has chest pain, no palpitations GI: no nausea, vomiting, abdominal pain, diarrhea, constipation GU: no dysuria, burning on urination, increased urinary frequency, hematuria  Ext: no leg edema Neuro: no unilateral weakness, numbness, or tingling, no vision change or hearing loss Skin: no rash, no skin tear. MSK: No muscle spasm, no deformity, no limitation of range of movement in  spin Heme: No easy bruising.  Travel history: No recent long distant travel.  Allergy:  Allergies  Allergen Reactions  . Dulaglutide Diarrhea and Other (See Comments)  . Liraglutide Diarrhea and Other (See Comments)  . Farxiga [Dapagliflozin] Rash  . Jardiance [Empagliflozin] Rash    Past Medical History:  Diagnosis Date  . Arthritis   . Asthma 2000  . Back pain   . Colon polyp 2012  . COPD (chronic obstructive pulmonary disease) ([place])   . Emphysema of lung ([place])   . Heart murmur   . Hernia 2000  . Hyperlipidemia   . Hypertension   . Personal history of malignant neoplasm of large intestine   . Personal history of tobacco use, presenting hazards to health   . Sleep apnea   . Special screening for malignant neoplasms, colon   . Type 2 diabetes mellitus with proteinuria ([place]) 10/11/2014    Past Surgical History:  Procedure Laterality Date  . CARDIAC CATHETERIZATION Left 09/25/2015   Procedure: Left Heart Cath and Coronary Angiography;  Surgeon: Yolonda Kida, MD;  Location: [place] [facility];  Service: Cardiovascular;  Laterality: Left;  . CHOLECYSTECTOMY  1970  . COLON SURGERY  07-16-99   sigmoid colon resection with primary anastomosis, chemotherapy for metastatic disease  . COLONOSCOPY  2001, 2012   Dr Bary Castilla, tubular adenoma of the cecum and ascending colon in 2012.  Marland Kitchen COLONOSCOPY WITH PROPOFOL N/A 02/12/2016   Procedure: COLONOSCOPY WITH PROPOFOL;  Surgeon: Robert Bellow, MD;  Location: [facility] ENDOSCOPY;  Service: Endoscopy;  Laterality: N/A;  . ESOPHAGOGASTRODUODENOSCOPY (EGD) WITH PROPOFOL N/A 02/12/2016  Procedure: ESOPHAGOGASTRODUODENOSCOPY (EGD) WITH PROPOFOL;  Surgeon: Robert Bellow, MD;  Location: [facility] ENDOSCOPY;  Service: Endoscopy;  Laterality: N/A;  . HERNIA REPAIR Right    right inguinal hernia repair  . JOINT REPLACEMENT Bilateral    knee replacement  . KNEE SURGERY Bilateral 2010  . MYRINGOTOMY WITH TUBE PLACEMENT Bilateral 08/16/2014    Procedure: MYRINGOTOMY WITH TUBE PLACEMENT;  Surgeon: Carloyn Manner, MD;  Location: [facility] ORS;  Service: ENT;  Laterality: Bilateral;  . PERIPHERAL VASCULAR CATHETERIZATION Right 09/04/2015   Procedure: Lower Extremity Angiography;  Surgeon: Katha Cabal, MD;  Location: [place] [facility];  Service: Cardiovascular;  Laterality: Right;  . PERIPHERAL VASCULAR CATHETERIZATION  09/04/2015   Procedure: Lower Extremity Intervention;  Surgeon: Katha Cabal, MD;  Location: [place] [facility];  Service: Cardiovascular;;  . TONSILLECTOMY    . TYMPANOSTOMY TUBE PLACEMENT      Social History:  reports that he quit smoking about 31 years ago. His smoking use included cigarettes. He has a 30.00 pack-year smoking history. He has never used smokeless tobacco. He reports that he does not drink alcohol and does not use drugs.  Family History:  Family History  Problem Relation Age of Onset  . Diabetes Father   . Esophageal cancer Mother   . Alzheimer's disease Paternal Uncle      Prior to Admission medications   Medication Sig Start Date End Date Taking? Authorizing Provider  acyclovir (ZOVIRAX) 400 MG tablet Take 400 mg by mouth 2 (two) times daily.    [provider]  aspirin EC 325 MG EC tablet Take 1 tablet (325 mg total) by mouth daily. 12/18/19   Raiford Noble Latif, DO  azelastine (ASTELIN) 0.1 % nasal spray Place 2 sprays into both nostrils 2 (two) times daily. Patient not taking: Reported on 05/15/2020 11/22/19   [provider]  bacitracin-neomycin-polymyxin-hydrocortisone (CORTISPORIN) 1 % ophthalmic ointment Place 1 application into both eyes 2 (two) times daily. 05/15/20   Vigg, Avanti, MD  carvedilol (COREG) 12.5 MG tablet Take 2 tablets (25 mg total) by mouth 2 (two) times daily with a meal. 01/12/20   [name], Jolene T, NP  clobetasol cream (TEMOVATE) 9.93 % Apply 1 application topically 2 (two) times daily. Apply to bilateral lower legs. 05/23/19   [name], Henrine Screws T,  NP  cloNIDine (CATAPRES) 0.1 MG tablet Take 1 tablet (0.1 mg total) by mouth 2 (two) times daily. 05/08/20 06/07/20  Marnee Guarneri T, NP  clotrimazole-betamethasone (LOTRISONE) cream Apply 1 application topically 2 (two) times daily. 05/15/20   Vigg, Avanti, MD  donepezil (ARICEPT) 5 MG tablet Take 1 tablet (5 mg total) by mouth at bedtime. 01/01/20   [name], Henrine Screws T, NP  furosemide (LASIX) 20 MG tablet Take 1 tablet (20 mg total) by mouth daily. 04/17/20   Myles Gip, DO  gabapentin (NEURONTIN) 300 MG capsule Take 1 capsule by mouth 3 (three) times daily. Patient not taking: Reported on 05/15/2020 04/12/19   [provider]  insulin glargine (LANTUS) 100 UNIT/ML injection Inject 0.25 mLs (25 Units total) into the skin daily. 01/29/20   Elodia Florence., MD  ipratropium-albuterol (DUONEB) 0.5-2.5 (3) MG/3ML SOLN Take 3 mLs by nebulization every 6 (six) hours.    [provider]  meloxicam (MOBIC) 7.5 MG tablet Take 7.5 mg by mouth daily. 04/25/20   [provider]  metFORMIN (GLUCOPHAGE) 1000 MG tablet Take 1,000 mg by mouth 2 (two) times daily with a meal.    [provider]  mupirocin ointment (BACTROBAN) 2 % Apply 1 application topically 2 (two) times daily. To open blisters bilateral legs. 07/29/19   [name], Henrine Screws T, NP  olmesartan (BENICAR) 40 MG tablet Take 1 tablet (40 mg total) by mouth daily. 01/12/20   [name], Henrine Screws T, NP  primidone (MYSOLINE) 50 MG tablet Take 1 tablet (50 mg total) by mouth 2 (two) times daily. 11/28/19   [name], Henrine Screws T, NP  rosuvastatin (CRESTOR) 40 MG tablet Take 1 tablet (40 mg total) by mouth at bedtime. 04/17/20   Myles Gip, DO  tamsulosin (FLOMAX) 0.4 MG CAPS capsule Take 0.4 mg by mouth daily. Patient restarted on his own due to trouble urinating    [provider]  Tiotropium Bromide Monohydrate (SPIRIVA RESPIMAT) 2.5 MCG/ACT AERS Inhale 2 puffs into the lungs daily. 05/15/20   Vigg, Avanti, MD   vitamin B-12 (CYANOCOBALAMIN) 500 MCG tablet Take 2,000 mcg by mouth at bedtime.    [provider]  Vitamin D, Cholecalciferol, 25 MCG (1000 UT) CAPS Take 2,000 mcg by mouth at bedtime.    [provider]    Physical Exam: Vitals:   05/16/20 1044 05/16/20 1045 05/16/20 1500 05/16/20 1632  BP: (!) 163/116  (!) 157/80 (!) 169/95  Pulse: (!) 106  80 87  Resp: 20  18 20   Temp: 98.2 F (36.8 C)   98 F (36.7 C)  TempSrc: Oral   Oral  SpO2: 95%  95% 95%  Weight:  125.2 kg    Height:  5\' 10"  (1.778 m)     General: Not in acute distress HEENT:       Eyes: PERRL, EOMI, no scleral icterus.       ENT: No discharge from the ears and nose, no pharynx injection, no tonsillar enlargement.        Neck: No JVD, no bruit, no mass felt. Heme: No neck lymph node enlargement. Cardiac: S1/S2, RRR, has 2/6 systolic murmurs, No gallops or rubs. Respiratory: No rales, wheezing, rhonchi or rubs. GI: Soft, nondistended, nontender, no rebound pain, no organomegaly, BS present. GU: No hematuria Ext: No pitting leg edema bilaterally. 1+DP/PT pulse bilaterally. Musculoskeletal: No joint deformities, No joint redness or warmth, no limitation of ROM in spin. Skin: No rashes.  Neuro: Alert, oriented X3, cranial nerves II-XII grossly intact, moves all extremities normally Psych: Patient is not psychotic, no suicidal or hemocidal ideation.  Labs on Admission: I have personally reviewed following labs and imaging studies  CBC: Recent Labs  Lab 05/16/20 1046  WBC 14.6*  HGB 14.8  HCT 42.4  MCV 88.3  PLT 465   Basic Metabolic Panel: Recent Labs  Lab 05/16/20 1046  NA 133*  K 3.7  CL 100  CO2 22  GLUCOSE 170*  BUN 13  CREATININE 1.03  CALCIUM 10.7*   GFR: Estimated Creatinine Clearance: 76 mL/min (by C-G formula based on SCr of 1.03 mg/dL). Liver Function Tests: No results for input(s): AST, ALT, ALKPHOS, BILITOT, PROT, ALBUMIN in the last 168 hours. No results for  input(s): LIPASE, AMYLASE in the last 168 hours. No results for input(s): AMMONIA in the last 168 hours. Coagulation Profile: Recent Labs  Lab 05/16/20 1254  INR 1.1   Cardiac Enzymes: No results for input(s): CKTOTAL, CKMB, CKMBINDEX, TROPONINI in the last 168 hours. BNP (last 3 results) No results for input(s): PROBNP in the last 8760 hours. HbA1C: No results for input(s): HGBA1C in the last 72 hours. CBG: Recent Labs  Lab 05/16/20 1639  GLUCAP 126*   Lipid Profile: No results for input(s): CHOL, HDL, LDLCALC, TRIG, CHOLHDL, LDLDIRECT in the last 72 hours. Thyroid Function Tests: No results for input(s): TSH, T4TOTAL, FREET4, T3FREE, THYROIDAB in the last 72 hours. Anemia Panel: No results for input(s): VITAMINB12, FOLATE, FERRITIN, TIBC, IRON, RETICCTPCT in the last 72 hours. Urine analysis:    Component Value Date/Time   COLORURINE AMBER (A) 03/28/2020 2019   APPEARANCEUR CLOUDY (A) 03/28/2020 2019   APPEARANCEUR Cloudy (A) 02/29/2020 1046   LABSPEC 1.016 03/28/2020 2019   PHURINE 5.0 03/28/2020 2019   GLUCOSEU NEGATIVE 03/28/2020 2019   HGBUR SMALL (A) 03/28/2020 2019   BILIRUBINUR NEGATIVE 03/28/2020 2019   BILIRUBINUR Negative 02/29/2020 [place] 03/28/2020 2019   PROTEINUR >=300 (A) 03/28/2020 2019   NITRITE NEGATIVE 03/28/2020 2019   LEUKOCYTESUR MODERATE (A) 03/28/2020 2019   Sepsis Labs: @LABRCNTIP (procalcitonin:4,lacticidven:4) )No results found for this or any previous visit (from the past 240 hour(s)).   Radiological Exams on Admission: DG Chest 2 View  Result Date: 05/16/2020 CLINICAL DATA:  Onset chest pain last night. EXAM: CHEST - 2 VIEW COMPARISON:  Single-view of the chest 03/28/2020. FINDINGS: Lungs clear. Heart size normal. No pneumothorax or pleural effusion. Loop recorder noted. No acute or focal bony abnormality. IMPRESSION: No acute disease. Electronically Signed   By: Inge Rise M.D.   On: 05/16/2020 14:05      EKG: I have personally reviewed.  Not done in ED, will get one.   Assessment/Plan Principal Problem:   NSTEMI (non-ST elevated myocardial infarction) ([place]) Active Problems:   Hypertension associated with diabetes ([place])   Type 2 diabetes mellitus with proteinuria (H[facility])   Hyperlipidemia associated with type 2 diabetes mellitus (HCC)   Gastroesophageal reflux disease without esophagitis   COPD (chronic obstructive pulmonary disease) (HCC)   History of CVA (cerebrovascular accident)   Leukocytosis   Hypercalcemia   NSTEMI and hx of CAD:  Trop . 27,000. [name] of card is consulted --> Planing to do cardiac cath the morning.  - admit to progressive unit as inpatient - IV heparin -  will stop trendingTrop - Repeat EKG in the am  - prn Nitroglycerin, Morphine, and aspirin clonidine, crestor - Risk factor stratification: will check FLP and A1C   Hypertension associated with diabetes (HCC) -IV hydralazine as needed -Coreg, clonidine, irbesartan  Type 2 diabetes mellitus with proteinuria ([place]): Recent A1c 6.9, well controlled.  Patient taking Lantus and Metformin at home -Sliding scale insulin -Decrease Lantus dose from home 25 to 15 units daily  Hyperlipidemia associated with type 2 diabetes mellitus (HCC) -Crestor  Gastroesophageal reflux disease without esophagitis -Protonix  COPD (chronic obstructive pulmonary disease) ([place]): Stable -Bronchodilators  History of CVA (cerebrovascular accident) -Aspirin, Crestor   Leukocytosis: No source of infection identified.  Likely reactive -Follow-up with CBC  Chronic diastolic congestive heart failure: 2D echo on 03/29/2020 showed EF CT density 5% with grade 1 diastolic dysfunction.  Patient does not have leg edema DVT PE no respiratory distress.  CHF seems to be compensated. -Continue home Lasix (will start in tomorrow afternoon after cardiac cath) -Check BNP --> 339  Hypercalcemia: Mild calcium level 10.7, may be due to  dehydration -Hold Lasix tonight   DVT ppx: on IV heparin Code Status: Full code Family Communication:    Yes, patient's daughter  at bed side Disposition Plan:  Anticipate discharge back to previous environment Consults called:  [name] of card Admission status and Level of care: Progressive  Cardiac:    as inpt     Status is: Inpatient  Remains inpatient appropriate because:Inpatient level of care appropriate due to severity of illness   Dispo: The patient is from: Home              Anticipated d/c is to: Home              Patient currently is not medically stable to d/c.   Difficult to place patient No         Date of Service 05/16/2020    [facility]   If 7PM-7AM, please contact night-coverage [email or web address] 05/16/2020, 5:53 PM

## 2020-05-16 NOTE — ED Notes (Signed)
Critical result  Troponin : greater than 27,000 Per Lab  MD. Preston Fleeting

## 2020-05-16 NOTE — Consult Note (Signed)
ANTICOAGULATION CONSULT NOTE  Pharmacy Consult for heparin infusion Indication: chest pain/ACS  Patient Measurements: Heparin Dosing Weight: 101.4 kg   Labs: Recent Labs    05/16/20 1046 05/16/20 1254 05/16/20 2137  HGB 14.8  --   --   HCT 42.4  --   --   PLT 227  --   --   APTT  --  25  --   LABPROT  --  13.8  --   INR  --  1.1  --   HEPARINUNFRC  --   --  0.15*  CREATININE 1.03  --   --   TROPONINIHS >27,000* >27,000*  --     Estimated Creatinine Clearance: 74.4 mL/min (by C-G formula based on SCr of 1.03 mg/dL).   Medical History: Past Medical History:  Diagnosis Date  . Arthritis   . Asthma 2000  . Back pain   . Colon polyp 2012  . COPD (chronic obstructive pulmonary disease) ([place])   . Emphysema of lung ([place])   . Heart murmur   . Hernia 2000  . Hyperlipidemia   . Hypertension   . Personal history of malignant neoplasm of large intestine   . Personal history of tobacco use, presenting hazards to health   . Sleep apnea   . Special screening for malignant neoplasms, colon   . Type 2 diabetes mellitus with proteinuria ([place]) 10/11/2014    Medications:  No prior anticoagulation documented   Assessment: 80 y.o. male with PMH significant for T2DM and HTN. Presented to ED from cardiologist office c/o chest pain. Troponin > 27,000. Pharmacy has been consulted for heparin initiation and management for ACS.   Baseline CBC WNL, aPTT 24s, INR 1.1  Goal of Therapy:  Heparin level 0.3-0.7 units/ml Monitor platelets by anticoagulation protocol: Yes   Plan:  --2/24 at 2137 HL = 0.15, subtherapeutic. Heparin 3000 unit IV bolus x 1 and increase heparin infusion to 1700 units/hr --Re-check HL 8 hours after rate change --Daily CBC per protocol while on heparin infusion  Benita Gutter 05/16/2020,10:17 PM

## 2020-05-16 NOTE — ED Notes (Signed)
Phone given to pt . Wife speaking with pt

## 2020-05-16 NOTE — Consult Note (Signed)
CARDIOLOGY CONSULT NOTE               Patient ID: Justin Griffith MRN: [id number] DOB/AGE: [birth year] 81 y.o.  Admit date: 05/16/2020 Referring Physician Veneta Penton Primary Physician Dr. Charlynne Cousins Primary Cardiologist [facility] Reason for Consultation acute myocardial infarction late presentation  HPI: Patient is a 80 year old white male obese hypertensive diabetic hyperlipidemia COPD obstructive sleep apnea reported is been having worsening dyspnea shortness of breath and fatigue was at the primary doctor's office yesterday who recommended patient have close acute cardiology follow-up to agree to see him the next day.  Unfortunately overnight the patient started having worsening chest pain angina rating to his arm which lasted about 4 hours but the patient did not seek medical attention he states the pain was maybe a 8/10 and stayed persistent.  He finally came to cardiologist office for evaluation and was found to have new EKG changes he was significantly dyspneic fatigue and not feeling well.  Patient complains of generalized fatigue and weakness.  Patient was found to have troponins greater than 27,000 with no EKG suggestive Q waves anterior laterally suggestive for myocardial infarction probably 24 hours ago.  Review of systems complete and found to be negative unless listed above     Past Medical History:  Diagnosis Date  . Arthritis   . Asthma 2000  . Back pain   . Colon polyp 2012  . COPD (chronic obstructive pulmonary disease) ([place])   . Emphysema of lung ([place])   . Heart murmur   . Hernia 2000  . Hyperlipidemia   . Hypertension   . Personal history of malignant neoplasm of large intestine   . Personal history of tobacco use, presenting hazards to health   . Sleep apnea   . Special screening for malignant neoplasms, colon   . Type 2 diabetes mellitus with proteinuria ([place]) 10/11/2014    Past Surgical History:  Procedure Laterality Date  . CARDIAC CATHETERIZATION  Left 09/25/2015   Procedure: Left Heart Cath and Coronary Angiography;  Surgeon: Yolonda Kida, MD;  Location: [place] [facility];  Service: Cardiovascular;  Laterality: Left;  . CHOLECYSTECTOMY  1970  . COLON SURGERY  07-16-99   sigmoid colon resection with primary anastomosis, chemotherapy for metastatic disease  . COLONOSCOPY  2001, 2012   Dr Bary Castilla, tubular adenoma of the cecum and ascending colon in 2012.  Marland Kitchen COLONOSCOPY WITH PROPOFOL N/A 02/12/2016   Procedure: COLONOSCOPY WITH PROPOFOL;  Surgeon: Robert Bellow, MD;  Location: [facility]COPY;  Service: Endoscopy;  Laterality: N/A;  . ESOPHAGOGASTRODUODENOSCOPY (EGD) WITH PROPOFOL N/A 02/12/2016   Procedure: ESOPHAGOGASTRODUODENOSCOPY (EGD) WITH PROPOFOL;  Surgeon: Robert Bellow, MD;  Location: [facility] ENDOSCOPY;  Service: Endoscopy;  Laterality: N/A;  . HERNIA REPAIR Right    right inguinal hernia repair  . JOINT REPLACEMENT Bilateral    knee replacement  . KNEE SURGERY Bilateral 2010  . MYRINGOTOMY WITH TUBE PLACEMENT Bilateral 08/16/2014   Procedure: MYRINGOTOMY WITH TUBE PLACEMENT;  Surgeon: Carloyn Manner, MD;  Location: [facility] ORS;  Service: ENT;  Laterality: Bilateral;  . PERIPHERAL VASCULAR CATHETERIZATION Right 09/04/2015   Procedure: Lower Extremity Angiography;  Surgeon: Katha Cabal, MD;  Location: [place] [facility];  Service: Cardiovascular;  Laterality: Right;  . PERIPHERAL VASCULAR CATHETERIZATION  09/04/2015   Procedure: Lower Extremity Intervention;  Surgeon: Katha Cabal, MD;  Location: [place] [facility];  Service: Cardiovascular;;  . TONSILLECTOMY    . TYMPANOSTOMY TUBE PLACEMENT  Medications Prior to Admission  Medication Sig Dispense Refill Last Dose  . acyclovir (ZOVIRAX) 400 MG tablet Take 400 mg by mouth 2 (two) times daily.   05/15/2020 at 1000  . aspirin EC 325 MG EC tablet Take 1 tablet (325 mg total) by mouth daily. 30 tablet 0 05/15/2020 at 1000  .  bacitracin-neomycin-polymyxin-hydrocortisone (CORTISPORIN) 1 % ophthalmic ointment Place 1 application into both eyes 2 (two) times daily. 3.5 g 0 05/15/2020 at 0930  . carvedilol (COREG) 12.5 MG tablet Take 2 tablets (25 mg total) by mouth 2 (two) times daily with a meal. 120 tablet 4 05/15/2020 at 1700  . clobetasol cream (TEMOVATE) 5.39 % Apply 1 application topically 2 (two) times daily. Apply to bilateral lower legs. 30 g 1 05/15/2020 at Unknown time  . cloNIDine (CATAPRES) 0.1 MG tablet Take 1 tablet (0.1 mg total) by mouth 2 (two) times daily. 60 tablet 0 05/15/2020 at 2000  . clotrimazole-betamethasone (LOTRISONE) cream Apply 1 application topically 2 (two) times daily. 30 g 0 05/15/2020 at Unknown time  . donepezil (ARICEPT) 5 MG tablet Take 1 tablet (5 mg total) by mouth at bedtime. 90 tablet 4 05/15/2020 at 2000  . furosemide (LASIX) 20 MG tablet Take 1 tablet (20 mg total) by mouth daily. 90 tablet 0 05/15/2020 at 1000  . insulin glargine (LANTUS) 100 UNIT/ML injection Inject 0.25 mLs (25 Units total) into the skin daily. 10 mL 11 05/15/2020 at Unknown time  . metFORMIN (GLUCOPHAGE) 1000 MG tablet Take 1,000 mg by mouth 2 (two) times daily with a meal.   05/15/2020 at 1700  . mupirocin ointment (BACTROBAN) 2 % Apply 1 application topically 2 (two) times daily. To open blisters bilateral legs. 22 g 0 05/15/2020 at Unknown time  . olmesartan (BENICAR) 40 MG tablet Take 1 tablet (40 mg total) by mouth daily. 90 tablet 4 05/15/2020 at 1000  . primidone (MYSOLINE) 50 MG tablet Take 1 tablet (50 mg total) by mouth 2 (two) times daily. 180 tablet 4 05/15/2020 at 2000  . rosuvastatin (CRESTOR) 40 MG tablet Take 1 tablet (40 mg total) by mouth at bedtime. 90 tablet 3 05/15/2020 at 2000  . tamsulosin (FLOMAX) 0.4 MG CAPS capsule Take 0.4 mg by mouth daily. Patient restarted on his own due to trouble urinating   05/15/2020 at 1000  . vitamin B-12 (CYANOCOBALAMIN) 500 MCG tablet Take 2,000 mcg by mouth at bedtime.    05/15/2020 at 2000  . Vitamin D, Cholecalciferol, 25 MCG (1000 UT) CAPS Take 2,000 mcg by mouth at bedtime.   05/15/2020 at 2000  . azelastine (ASTELIN) 0.1 % nasal spray Place 2 sprays into both nostrils 2 (two) times daily. (Patient not taking: No sig reported)     . gabapentin (NEURONTIN) 300 MG capsule Take 1 capsule by mouth 3 (three) times daily. (Patient not taking: No sig reported)     . ipratropium-albuterol (DUONEB) 0.5-2.5 (3) MG/3ML SOLN Take 3 mLs by nebulization every 6 (six) hours.   unknown at prn  . meloxicam (MOBIC) 7.5 MG tablet Take 7.5 mg by mouth daily.   unknown at prn  . Tiotropium Bromide Monohydrate (SPIRIVA RESPIMAT) 2.5 MCG/ACT AERS Inhale 2 puffs into the lungs daily. 1 g 3    Social History   Socioeconomic History  . Marital status: Married    Spouse name: Not on file  . Number of children: Not on file  . Years of education: Not on file  . Highest education level: GED or equivalent  Occupational History  . Occupation: retired    Comment: [employer]  Tobacco Use  . Smoking status: Former Smoker    Packs/day: 1.00    Years: 30.00    Pack years: 30.00    Types: Cigarettes    Quit date: 03/23/1989    Years since quitting: 31.1  . Smokeless tobacco: Never Used  Vaping Use  . Vaping Use: Never used  Substance and Sexual Activity  . Alcohol use: No    Alcohol/week: 0.0 standard drinks  . Drug use: No  . Sexual activity: Not on file  Other Topics Concern  . 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 Difficulty of Paying Living Expenses: Not very hard  Food Insecurity: No Food Insecurity  . Worried About Charity fundraiser in the Last Year: Never true  . Ran Out of Food in the Last Year: Never true  Transportation Needs: No Transportation Needs  . Lack of Transportation (Medical): No  . Lack of Transportation (Non-Medical): No  Physical Activity:  Inactive  . Days of Exercise per Week: 0 days  . Minutes of Exercise per Session: 0 min  Stress: No Stress Concern Present  . Feeling of Stress : Only a little  Social Connections: Socially Integrated  . Frequency of Communication with Friends and Family: More than three times a week  . Frequency of Social Gatherings with Friends and Family: More than three times a week  . Attends Religious Services: More than 4 times per year  . Active Member of Clubs or Organizations: Yes  . Attends Archivist Meetings: More than 4 times per year  . Marital Status: Married  Human resources officer Violence: Unknown  . Fear of Current or Ex-Partner: No  . Emotionally Abused: Not on file  . Physically Abused: No  . Sexually Abused: No    Family History  Problem Relation Age of Onset  . Diabetes Father   . Esophageal cancer Mother   . Alzheimer's disease Paternal Uncle       Review of systems complete and found to be negative unless listed above      PHYSICAL EXAM  General: Well developed, well nourished, in no acute distress obese male HEENT:  Normocephalic and atramatic Neck:  No JVD.  Lungs: Clear bilaterally to auscultation and percussion. Heart: HRRR . Normal S1 and S2 without gallops or murmurs.  Abdomen: Bowel sounds are positive, abdomen soft and non-tender  Msk:  Back normal, normal gait. Normal strength and tone for age. Extremities: No clubbing, cyanosis or2+ edema.   Neuro: Alert and oriented X 3. Psych:  Good affect, responds appropriately  Labs:   Lab Results  Component Value Date   WBC 14.6 (H) 05/16/2020   HGB 14.8 05/16/2020   HCT 42.4 05/16/2020   MCV 88.3 05/16/2020   PLT 227 05/16/2020    Recent Labs  Lab 05/16/20 1046  NA 133*  K 3.7  CL 100  CO2 22  BUN 13  CREATININE 1.03  CALCIUM 10.7*  GLUCOSE 170*   No results found for: CKTOTAL, CKMB, CKMBINDEX, TROPONINI  Lab Results  Component Value Date   CHOL 103 02/02/2020   CHOL 234 (H)  01/01/2020   CHOL 135 12/15/2019   Lab Results  Component Value Date   HDL 26 (L) 02/02/2020   HDL 32 (L) 01/01/2020  HDL 30 (L) 12/15/2019   Lab Results  Component Value Date   LDLCALC 48 02/02/2020   LDLCALC 139 (H) 01/01/2020   LDLCALC 60 12/15/2019   Lab Results  Component Value Date   TRIG 171 (H) 02/02/2020   TRIG 346 (H) 01/01/2020   TRIG 226 (H) 12/15/2019   Lab Results  Component Value Date   CHOLHDL 4.5 12/15/2019   No results found for: LDLDIRECT    Radiology: DG Chest 2 View  Result Date: 05/16/2020 CLINICAL DATA:  Onset chest pain last night. EXAM: CHEST - 2 VIEW COMPARISON:  Single-view of the chest 03/28/2020. FINDINGS: Lungs clear. Heart size normal. No pneumothorax or pleural effusion. Loop recorder noted. No acute or focal bony abnormality. IMPRESSION: No acute disease. Electronically Signed   By: Inge Rise M.D.   On: 05/16/2020 14:05    EKG: EKG normal sinus rhythm new Q waves anterior laterally nonspecific ST-T wave changes  ASSESSMENT AND PLAN:  Status post STEMI late presentation Dyspnea Probable coronary disease Obesity Obstructive sleep apnea Hypertension Hyperlipidemia Diabetes Peripheral vascular disease . Plan Agree with admit to telemetry follow-up troponins and EKGs Maintain anticoagulation with heparin aspirin Blood pressure control ACE inhibitor beta-blocker aspirin Echocardiogram for assessment of left ventricular function and wall motion Consider cardiac cath prior to discharge Continue diabetes management and control Recommend CPAP for obstructive sleep apnea Inhalers as necessary for COPD   Signed: Yolonda Kida MD 05/16/2020, 5:19 PM

## 2020-05-16 NOTE — ED Notes (Signed)
ED Provider at bedside. 

## 2020-05-16 NOTE — ED Notes (Signed)
Hospital Provider at bedside 

## 2020-05-17 ENCOUNTER — Telehealth: Payer: Self-pay | Admitting: General Practice

## 2020-05-17 ENCOUNTER — Encounter: Payer: Self-pay | Admitting: Internal Medicine

## 2020-05-17 ENCOUNTER — Other Ambulatory Visit: Payer: Self-pay

## 2020-05-17 ENCOUNTER — Telehealth: Payer: Medicare Other

## 2020-05-17 ENCOUNTER — Inpatient Hospital Stay: Payer: Medicare Other

## 2020-05-17 ENCOUNTER — Encounter
Admission: EM | Disposition: A | Discharge status: Home Health | Payer: Self-pay | Source: Home / Self Care | Attending: Internal Medicine

## 2020-05-17 DIAGNOSIS — I214 Non-ST elevation (NSTEMI) myocardial infarction: Secondary | ICD-10-CM | POA: Diagnosis not present

## 2020-05-17 DIAGNOSIS — I5033 Acute on chronic diastolic (congestive) heart failure: Secondary | ICD-10-CM

## 2020-05-17 HISTORY — PX: CORONARY STENT INTERVENTION: CATH118234

## 2020-05-17 HISTORY — PX: LEFT HEART CATH AND CORONARY ANGIOGRAPHY: CATH118249

## 2020-05-17 LAB — BASIC METABOLIC PANEL
Anion gap: 5 (ref 5–15)
BUN: 16 mg/dL (ref 8–23)
CO2: 29 mmol/L (ref 22–32)
Calcium: 10.2 mg/dL (ref 8.9–10.3)
Chloride: 103 mmol/L (ref 98–111)
Creatinine, Ser: 0.98 mg/dL (ref 0.61–1.24)
GFR, Estimated: >60 mL/min (ref >60)
Glucose, Bld: 150 mg/dL — ABNORMAL HIGH (ref 70–99)
Potassium: 3.8 mmol/L (ref 3.5–5.1)
Sodium: 137 mmol/L (ref 135–145)

## 2020-05-17 LAB — HEMOGLOBIN A1C
Hgb A1c MFr Bld: 7.9 % — ABNORMAL HIGH (ref 4.8–5.6)
Mean Plasma Glucose: 180.03 mg/dL

## 2020-05-17 LAB — LIPID PANEL
Cholesterol: 78 mg/dL (ref 0–200)
HDL: 31 mg/dL — ABNORMAL LOW (ref >40)
LDL Cholesterol: 17 mg/dL (ref 0–99)
Total CHOL/HDL Ratio: 2.5 RATIO
Triglycerides: 152 mg/dL — ABNORMAL HIGH (ref <150)
VLDL: 30 mg/dL (ref 0–40)

## 2020-05-17 LAB — GLUCOSE, CAPILLARY
Glucose-Capillary: 186 mg/dL — ABNORMAL HIGH (ref 70–99)
Glucose-Capillary: 214 mg/dL — ABNORMAL HIGH (ref 70–99)
Glucose-Capillary: 175 mg/dL — ABNORMAL HIGH (ref 70–99)

## 2020-05-17 LAB — CBC
HCT: 39.5 % (ref 39.0–52.0)
Hemoglobin: 13.8 g/dL (ref 13.0–17.0)
MCH: 31.4 pg (ref 26.0–34.0)
MCHC: 34.9 g/dL (ref 30.0–36.0)
MCV: 89.8 fL (ref 80.0–100.0)
Platelets: 195 10*3/uL (ref 150–400)
RBC: 4.4 MIL/uL (ref 4.22–5.81)
RDW: 13.3 % (ref 11.5–15.5)
WBC: 8.8 10*3/uL (ref 4.0–10.5)
nRBC: 0 % (ref 0.0–0.2)

## 2020-05-17 LAB — POCT ACTIVATED CLOTTING TIME: Activated Clotting Time: 303 seconds

## 2020-05-17 LAB — HEPARIN LEVEL (UNFRACTIONATED): Heparin Unfractionated: 0.24 IU/mL — ABNORMAL LOW (ref 0.30–0.70)

## 2020-05-17 LAB — BRAIN NATRIURETIC PEPTIDE: B Natriuretic Peptide: 187.2 pg/mL — ABNORMAL HIGH (ref 0.0–100.0)

## 2020-05-17 SURGERY — LEFT HEART CATH AND CORONARY ANGIOGRAPHY
Anesthesia: Moderate Sedation

## 2020-05-17 MED ORDER — HEPARIN (PORCINE) IN NACL 1000-0.9 UT/500ML-% IV SOLN
INTRAVENOUS | Status: DC | PRN
  Administered 2020-05-17 (×2): 500 mL

## 2020-05-17 MED ORDER — SODIUM CHLORIDE 0.9% FLUSH
3.0000 mL | Freq: Two times a day (BID) | INTRAVENOUS | Status: DC
  Administered 2020-05-17 – 2020-05-21 (×7): 3 mL via INTRAVENOUS

## 2020-05-17 MED ORDER — ONDANSETRON HCL 4 MG/2ML IJ SOLN: 4.0000 mg | Freq: Four times a day (QID) | INTRAMUSCULAR | Status: DC | PRN

## 2020-05-17 MED ORDER — LABETALOL HCL 5 MG/ML IV SOLN: 10.0000 mg | INTRAVENOUS | Status: AC | PRN

## 2020-05-17 MED ORDER — TICAGRELOR 90 MG PO TABS
90.0000 mg | ORAL_TABLET | Freq: Two times a day (BID) | ORAL | Status: DC
  Administered 2020-05-17 – 2020-05-21 (×8): 90 mg via ORAL
  Filled 2020-05-17 (×8): qty 1

## 2020-05-17 MED ORDER — IPRATROPIUM-ALBUTEROL 0.5-2.5 (3) MG/3ML IN SOLN
3.0000 mL | Freq: Four times a day (QID) | RESPIRATORY_TRACT | Status: DC | PRN
  Administered 2020-05-19: 3 mL via RESPIRATORY_TRACT
  Filled 2020-05-17: qty 3

## 2020-05-17 MED ORDER — LABETALOL HCL 100 MG PO TABS
100.0000 mg | ORAL_TABLET | Freq: Three times a day (TID) | ORAL | Status: DC
  Administered 2020-05-17 – 2020-05-18 (×3): 100 mg via ORAL
  Filled 2020-05-17 (×4): qty 1

## 2020-05-17 MED ORDER — IPRATROPIUM-ALBUTEROL 0.5-2.5 (3) MG/3ML IN SOLN
3.0000 mL | Freq: Once | RESPIRATORY_TRACT | Status: DC
  Filled 2020-05-17: qty 3

## 2020-05-17 MED ORDER — HEPARIN SODIUM (PORCINE) 1000 UNIT/ML IJ SOLN
INTRAMUSCULAR | Status: DC | PRN
  Administered 2020-05-17: 6000 [IU] via INTRAVENOUS
  Administered 2020-05-17: 5000 [IU] via INTRAVENOUS

## 2020-05-17 MED ORDER — ASPIRIN 81 MG PO CHEW
81.0000 mg | CHEWABLE_TABLET | Freq: Every day | ORAL | Status: DC
  Administered 2020-05-18 – 2020-05-19 (×2): 81 mg via ORAL
  Filled 2020-05-17 (×2): qty 1

## 2020-05-17 MED ORDER — IOHEXOL 300 MG/ML  SOLN
INTRAMUSCULAR | Status: DC | PRN
  Administered 2020-05-17: 215 mL

## 2020-05-17 MED ORDER — LABETALOL HCL 5 MG/ML IV SOLN
INTRAVENOUS | Status: AC
  Filled 2020-05-17: qty 4

## 2020-05-17 MED ORDER — ACETAMINOPHEN 325 MG PO TABS: 650.0000 mg | ORAL_TABLET | ORAL | Status: DC | PRN

## 2020-05-17 MED ORDER — HEPARIN SODIUM (PORCINE) 1000 UNIT/ML IJ SOLN
INTRAMUSCULAR | Status: AC
  Filled 2020-05-17: qty 1

## 2020-05-17 MED ORDER — TICAGRELOR 90 MG PO TABS
ORAL_TABLET | ORAL | Status: DC | PRN
  Administered 2020-05-17: 180 mg via ORAL

## 2020-05-17 MED ORDER — FUROSEMIDE 10 MG/ML IJ SOLN: 40.0000 mg | Freq: Once | INTRAMUSCULAR | Status: AC

## 2020-05-17 MED ORDER — LORAZEPAM 0.5 MG PO TABS
0.5000 mg | ORAL_TABLET | Freq: Once | ORAL | Status: AC
  Administered 2020-05-17: 0.5 mg via ORAL
  Filled 2020-05-17: qty 1

## 2020-05-17 MED ORDER — HYDRALAZINE HCL 20 MG/ML IJ SOLN: 10.0000 mg | INTRAMUSCULAR | Status: AC | PRN

## 2020-05-17 MED ORDER — FUROSEMIDE 10 MG/ML IJ SOLN
INTRAMUSCULAR | Status: AC
  Administered 2020-05-17: 40 mg via INTRAVENOUS
  Filled 2020-05-17: qty 4

## 2020-05-17 MED ORDER — ASPIRIN 81 MG PO CHEW
CHEWABLE_TABLET | ORAL | Status: AC
  Filled 2020-05-17: qty 3

## 2020-05-17 MED ORDER — HEPARIN (PORCINE) IN NACL 1000-0.9 UT/500ML-% IV SOLN
INTRAVENOUS | Status: AC
  Filled 2020-05-17: qty 1000

## 2020-05-17 MED ORDER — FENTANYL CITRATE (PF) 100 MCG/2ML IJ SOLN
INTRAMUSCULAR | Status: AC
  Filled 2020-05-17: qty 2

## 2020-05-17 MED ORDER — TICAGRELOR 90 MG PO TABS
ORAL_TABLET | ORAL | Status: AC
  Filled 2020-05-17: qty 2

## 2020-05-17 MED ORDER — SODIUM CHLORIDE 0.9 % IV SOLN: 250.0000 mL | INTRAVENOUS | Status: DC | PRN

## 2020-05-17 MED ORDER — FUROSEMIDE 10 MG/ML IJ SOLN
80.0000 mg | INTRAMUSCULAR | Status: AC
  Administered 2020-05-17: 80 mg via INTRAVENOUS
  Filled 2020-05-17: qty 8

## 2020-05-17 MED ORDER — MIDAZOLAM HCL 2 MG/2ML IJ SOLN
INTRAMUSCULAR | Status: AC
  Filled 2020-05-17: qty 2

## 2020-05-17 MED ORDER — LIDOCAINE HCL (PF) 1 % IJ SOLN
INTRAMUSCULAR | Status: DC | PRN
  Administered 2020-05-17: 2 mL

## 2020-05-17 MED ORDER — LABETALOL HCL 5 MG/ML IV SOLN
INTRAVENOUS | Status: DC | PRN
  Administered 2020-05-17: 10 mg via INTRAVENOUS
  Administered 2020-05-17: 20 mg via INTRAVENOUS

## 2020-05-17 MED ORDER — FENTANYL CITRATE (PF) 100 MCG/2ML IJ SOLN
INTRAMUSCULAR | Status: DC | PRN
  Administered 2020-05-17 (×2): 25 ug via INTRAVENOUS

## 2020-05-17 MED ORDER — HYDROMORPHONE HCL 1 MG/ML IJ SOLN
1.0000 mg | INTRAMUSCULAR | Status: DC | PRN
  Filled 2020-05-17: qty 1

## 2020-05-17 MED ORDER — HYDRALAZINE HCL 20 MG/ML IJ SOLN
INTRAMUSCULAR | Status: AC
  Filled 2020-05-17: qty 1

## 2020-05-17 MED ORDER — SODIUM CHLORIDE 0.9% FLUSH: 3.0000 mL | INTRAVENOUS | Status: DC | PRN

## 2020-05-17 MED ORDER — HYDRALAZINE HCL 20 MG/ML IJ SOLN
INTRAMUSCULAR | Status: AC
  Administered 2020-05-17: 10 mg via INTRAVENOUS
  Filled 2020-05-17: qty 1

## 2020-05-17 MED ORDER — ASPIRIN 81 MG PO CHEW
CHEWABLE_TABLET | ORAL | Status: DC | PRN
  Administered 2020-05-17: 243 mg via ORAL

## 2020-05-17 MED ORDER — LIDOCAINE HCL (PF) 1 % IJ SOLN
INTRAMUSCULAR | Status: AC
  Filled 2020-05-17: qty 30

## 2020-05-17 MED ORDER — VERAPAMIL HCL 2.5 MG/ML IV SOLN
INTRAVENOUS | Status: DC | PRN
  Administered 2020-05-17: 2.5 mg via INTRA_ARTERIAL

## 2020-05-17 MED ORDER — VERAPAMIL HCL 2.5 MG/ML IV SOLN
INTRAVENOUS | Status: AC
  Filled 2020-05-17: qty 2

## 2020-05-17 MED ORDER — SODIUM CHLORIDE 0.9 % WEIGHT BASED INFUSION: 1.0000 mL/kg/h | INTRAVENOUS | Status: AC

## 2020-05-17 SURGICAL SUPPLY — 21 items
BALLN TREK RX 2.5X20 (BALLOONS) ×2
BALLN ~~LOC~~ TREK RX 3.5X15 (BALLOONS) ×2
BALLOON TREK RX 2.5X20 (BALLOONS) ×1 IMPLANT
BALLOON ~~LOC~~ TREK RX 3.5X15 (BALLOONS) ×1 IMPLANT
CATH INFINITI 5 FR JL3.5 (CATHETERS) ×2 IMPLANT
CATH INFINITI 5FR JL4 (CATHETERS) ×2 IMPLANT
CATH INFINITI JR4 5F (CATHETERS) ×2 IMPLANT
CATH VISTA GUIDE 6FR XB3.5 (CATHETERS) ×2 IMPLANT
CATH VISTA GUIDE 6FR XB4 (CATHETERS) ×2 IMPLANT
DEVICE RAD TR BAND REGULAR (VASCULAR PRODUCTS) ×2 IMPLANT
GLIDESHEATH SLEND SS 6F .021 (SHEATH) ×2 IMPLANT
GUIDEWIRE INQWIRE 1.5J.035X260 (WIRE) ×1 IMPLANT
INQWIRE 1.5J .035X260CM (WIRE) ×2
KIT ENCORE 26 ADVANTAGE (KITS) ×2 IMPLANT
KIT MANI 3VAL PERCEP (MISCELLANEOUS) ×2 IMPLANT
NEEDLE PERC 18GX7CM (NEEDLE) ×2 IMPLANT
PACK CARDIAC CATH (CUSTOM PROCEDURE TRAY) ×2 IMPLANT
SHEATH AVANTI 5FR X 11CM (SHEATH) ×2 IMPLANT
STENT RESOLUTE ONYX 3.0X34 (Permanent Stent) ×2 IMPLANT
WIRE G HI TQ BMW 190 (WIRE) ×2 IMPLANT
WIRE GUIDERIGHT .035X150 (WIRE) ×2 IMPLANT

## 2020-05-17 NOTE — OR Nursing (Signed)
PT still c/o shob. MD notified. PT on 3L [place] for comfort. MD has come down to evaluate pt.

## 2020-05-17 NOTE — OR Nursing (Signed)
Urology PA at bedside irrigating bladder

## 2020-05-17 NOTE — OR Nursing (Signed)
TR band successfully removed and replaced with gauze and tegaderm.

## 2020-05-17 NOTE — Consult Note (Signed)
Urology Consult  I have been asked to see the patient by Dr. Sharen Hones, for evaluation and management of gross hematuria.  Chief Complaint: Gross hematuria  History of Present Illness: Justin Griffith is a 80 y.o. year old who underwent a heart catheterization who developed gross hematuria after a catheter was placed by cardiac intervention staff.   He presented to the ED   Patient is known to the practice and we have seen him in the past for gross hematuria.  He has a 7+ prostate with significant prostamegaly on CT scan in the caudal to cranial direction.  See previous notes.   We have been unable to complete his cystoscopy due to his other medical problems.  This will need to be addressed in the future as he does have a smoking history.    He was resting in bed and SOB.  A 80fr catheter was in place draining bright red blood.    He underwent cardiac catheterization this morning for further evaluation of a acute myocardial infarction in late presentation.  He received large amount of heparin as well as aspirin and Brilinta for the procedure.  His serum creatinine is 0.98 and his hemoglobin is 13.8.      Past Medical History:  Diagnosis Date  . Arthritis   . Asthma 2000  . Back pain   . Colon polyp 2012  . COPD (chronic obstructive pulmonary disease) ([place])   . Emphysema of lung ([place])   . Heart murmur   . Hernia 2000  . Hyperlipidemia   . Hypertension   . Personal history of malignant neoplasm of large intestine   . Personal history of tobacco use, presenting hazards to health   . Sleep apnea   . Special screening for malignant neoplasms, colon   . Type 2 diabetes mellitus with proteinuria ([place]) 10/11/2014    Past Surgical History:  Procedure Laterality Date  . CARDIAC CATHETERIZATION Left 09/25/2015   Procedure: Left Heart Cath and Coronary Angiography;  Surgeon: Yolonda Kida, MD;  Location: [place] [facility];  Service: Cardiovascular;  Laterality: Left;  .  CHOLECYSTECTOMY  1970  . COLON SURGERY  07-16-99   sigmoid colon resection with primary anastomosis, chemotherapy for metastatic disease  . COLONOSCOPY  2001, 2012   Dr Bary Castilla, tubular adenoma of the cecum and ascending colon in 2012.  Marland Kitchen COLONOSCOPY WITH PROPOFOL N/A 02/12/2016   Procedure: COLONOSCOPY WITH PROPOFOL;  Surgeon: Robert Bellow, MD;  Location: [facility] ENDOSCOPY;  Service: Endoscopy;  Laterality: N/A;  . ESOPHAGOGASTRODUODENOSCOPY (EGD) WITH PROPOFOL N/A 02/12/2016   Procedure: ESOPHAGOGASTRODUODENOSCOPY (EGD) WITH PROPOFOL;  Surgeon: Robert Bellow, MD;  Location: [facility] ENDOSCOPY;  Service: Endoscopy;  Laterality: N/A;  . HERNIA REPAIR Right    right inguinal hernia repair  . JOINT REPLACEMENT Bilateral    knee replacement  . KNEE SURGERY Bilateral 2010  . MYRINGOTOMY WITH TUBE PLACEMENT Bilateral 08/16/2014   Procedure: MYRINGOTOMY WITH TUBE PLACEMENT;  Surgeon: Carloyn Manner, MD;  Location: [facility] ORS;  Service: ENT;  Laterality: Bilateral;  . PERIPHERAL VASCULAR CATHETERIZATION Right 09/04/2015   Procedure: Lower Extremity Angiography;  Surgeon: Katha Cabal, MD;  Location: [place] [facility];  Service: Cardiovascular;  Laterality: Right;  . PERIPHERAL VASCULAR CATHETERIZATION  09/04/2015   Procedure: Lower Extremity Intervention;  Surgeon: Katha Cabal, MD;  Location: [place] [facility];  Service: Cardiovascular;;  . TONSILLECTOMY    . [place]  Medications:  No current facility-administered medications on file prior to encounter.   Current Outpatient Medications on File Prior to Encounter  Medication Sig Dispense Refill  . acyclovir (ZOVIRAX) 400 MG tablet Take 400 mg by mouth 2 (two) times daily.    Marland Kitchen aspirin EC 325 MG EC tablet Take 1 tablet (325 mg total) by mouth daily. 30 tablet 0  . bacitracin-neomycin-polymyxin-hydrocortisone (CORTISPORIN) 1 % ophthalmic ointment Place 1 application into both eyes 2 (two) times daily.  3.5 g 0  . carvedilol (COREG) 12.5 MG tablet Take 2 tablets (25 mg total) by mouth 2 (two) times daily with a meal. 120 tablet 4  . clobetasol cream (TEMOVATE) 1.61 % Apply 1 application topically 2 (two) times daily. Apply to bilateral lower legs. 30 g 1  . cloNIDine (CATAPRES) 0.1 MG tablet Take 1 tablet (0.1 mg total) by mouth 2 (two) times daily. 60 tablet 0  . clotrimazole-betamethasone (LOTRISONE) cream Apply 1 application topically 2 (two) times daily. 30 g 0  . donepezil (ARICEPT) 5 MG tablet Take 1 tablet (5 mg total) by mouth at bedtime. 90 tablet 4  . furosemide (LASIX) 20 MG tablet Take 1 tablet (20 mg total) by mouth daily. 90 tablet 0  . insulin glargine (LANTUS) 100 UNIT/ML injection Inject 0.25 mLs (25 Units total) into the skin daily. 10 mL 11  . metFORMIN (GLUCOPHAGE) 1000 MG tablet Take 1,000 mg by mouth 2 (two) times daily with a meal.    . mupirocin ointment (BACTROBAN) 2 % Apply 1 application topically 2 (two) times daily. To open blisters bilateral legs. 22 g 0  . olmesartan (BENICAR) 40 MG tablet Take 1 tablet (40 mg total) by mouth daily. 90 tablet 4  . primidone (MYSOLINE) 50 MG tablet Take 1 tablet (50 mg total) by mouth 2 (two) times daily. 180 tablet 4  . rosuvastatin (CRESTOR) 40 MG tablet Take 1 tablet (40 mg total) by mouth at bedtime. 90 tablet 3  . tamsulosin (FLOMAX) 0.4 MG CAPS capsule Take 0.4 mg by mouth daily. Patient restarted on his own due to trouble urinating    . vitamin B-12 (CYANOCOBALAMIN) 500 MCG tablet Take 2,000 mcg by mouth at bedtime.    . Vitamin D, Cholecalciferol, 25 MCG (1000 UT) CAPS Take 2,000 mcg by mouth at bedtime.    Marland Kitchen azelastine (ASTELIN) 0.1 % nasal spray Place 2 sprays into both nostrils 2 (two) times daily. (Patient not taking: No sig reported)    . gabapentin (NEURONTIN) 300 MG capsule Take 1 capsule by mouth 3 (three) times daily. (Patient not taking: No sig reported)    . ipratropium-albuterol (DUONEB) 0.5-2.5 (3) MG/3ML SOLN Take  3 mLs by nebulization every 6 (six) hours.    . meloxicam (MOBIC) 7.5 MG tablet Take 7.5 mg by mouth daily.    . Tiotropium Bromide Monohydrate (SPIRIVA RESPIMAT) 2.5 MCG/ACT AERS Inhale 2 puffs into the lungs daily. 1 g 3    Allergies:  Allergies  Allergen Reactions  . Dulaglutide Diarrhea and Other (See Comments)  . Liraglutide Diarrhea and Other (See Comments)  . Farxiga [Dapagliflozin] Rash  . Jardiance [Empagliflozin] Rash    Family History  Problem Relation Age of Onset  . Diabetes Father   . Esophageal cancer Mother   . Alzheimer's disease Paternal Uncle     Social History:  reports that he quit smoking about 31 years ago. His smoking use included cigarettes. He has a 30.00 pack-year smoking history. He has never used smokeless tobacco.  He reports that he does not drink alcohol and does not use drugs.  ROS: A complete review of systems was performed.  All systems are negative except for pertinent findings as noted.  Physical Exam:  Vital signs in last 24 hours: Temp:  [98 F (36.7 C)-98.5 F (36.9 C)] 98.2 F (36.8 C) (02/25 0718) Pulse Rate:  [57-104] 70 (02/25 1200) Resp:  [13-24] 17 (02/25 1200) BP: (140-190)/(65-104) 140/70 (02/25 1136) SpO2:  [92 %-98 %] 98 % (02/25 1200) Weight:  [117.9 kg-120.5 kg] 117.9 kg (02/25 0718) Constitutional:  Alert and oriented, No acute distress HEENT: [place] AT, moist mucus membranes.  Trachea midline.  On nasal canula.   Respiratory: Normal respiratory effort, GI: Abdomen is soft, nontender, nondistended, no abdominal masses GU: No CVA tenderness.  68 French catheter is in place draining bright red urine.  Patient is uncircumcised and foreskin is over the glands.  There is also blood oozing out from around the catheter.   Skin: No rashes, bruises or suspicious lesions Lymph: No cervical or inguinal adenopathy Neurologic: Grossly intact, no focal deficits, moving all 4 extremities Psychiatric: Normal mood and affect   Laboratory  Data:  Recent Labs    05/16/20 1046 05/17/20 0533  WBC 14.6* 8.8  HGB 14.8 13.8  HCT 42.4 39.5   Recent Labs    05/16/20 1046 05/17/20 0533  NA 133* 137  K 3.7 3.8  CL 100 103  CO2 22 29  GLUCOSE 170* 150*  BUN 13 16  CREATININE 1.03 0.98  CALCIUM 10.7* 10.2   Recent Labs    05/16/20 1254  INR 1.1   No results for input(s): LABURIN in the last 72 hours. Results for orders placed or performed during the hospital encounter of 05/16/20  SARS CORONAVIRUS 2 (TAT 6-24 HRS) Nasopharyngeal Nasopharyngeal Swab     Status: None   Collection Time: 05/16/20 12:54 PM   Specimen: Nasopharyngeal Swab  Result Value Ref Range Status   SARS Coronavirus 2 NEGATIVE NEGATIVE Final    Comment: (NOTE) SARS-CoV-2 target nucleic acids are NOT DETECTED.  The SARS-CoV-2 RNA is generally detectable in upper and lower respiratory specimens during the acute phase of infection. Negative results do not preclude SARS-CoV-2 infection, do not rule out co-infections with other pathogens, and should not be used as the sole basis for treatment or other patient management decisions. Negative results must be combined with clinical observations, patient history, and epidemiological information. The expected result is Negative.  Fact Sheet for Patients: SugarRoll.be  Fact Sheet for Healthcare Providers: https://www.woods-mathews.com/  This test is not yet approved or cleared by the [place] FDA and  has been authorized for detection and/or diagnosis of SARS-CoV-2 by FDA under an Emergency Use Authorization (EUA). This EUA will remain  in effect (meaning this test can be used) for the duration of the COVID-19 declaration under Se ction 564(b)(1) of the Act, 21 U.S.C. section 360bbb-3(b)(1), unless the authorization is terminated or revoked sooner.  Performed at [facility], [place] [address] [place], [place] [address]      Radiologic  Imaging: DG Chest 2 View  Result Date: 05/16/2020 CLINICAL DATA:  Onset chest pain last night. EXAM: CHEST - 2 VIEW COMPARISON:  Single-view of the chest 03/28/2020. FINDINGS: Lungs clear. Heart size normal. No pneumothorax or pleural effusion. Loop recorder noted. No acute or focal bony abnormality. IMPRESSION: No acute disease. Electronically Signed   By: Inge Rise M.D.   On: 05/16/2020 14:05   As  urine was bright red in the catheter tubing, I was concerned that a 14 French catheter may clot off during his admission.  So I decided to replace the 14 French catheter with a 22 [place] two-way coud catheter.  10 ml of water was removed from the balloon, a 14 FR foley cath was removed with out difficulty.  Patient was cleaned and prepped in a sterile fashion with betadine. A 22 FR 2-way catheter was replaced into the bladder.  I had some mild resistance at the fossa naviculis, but I was able to press pass this easily.  Urine return was noted 40 ml and urine was bright red in color.   500 ml of sterile water was instilled and irrigated into the bladder with a 37ml [place] syringe through the catheter in place.  Initially the irrigation was difficult, but that I let down the Foley balloon reposition the catheter, re inflated the balloon and Foley irrigated easily.  I irrigated the bladder until urine was a light pink.  No clots were obtained.  His Foley was then attached to an overnight bag.  Impression/Assessment:  80 year old male with a history of gross hematuria and massive BPH who experienced gross hematuria after catheter was placed for a cardiac catheterization this morning.  The hematuria is likely due to Foley catheter trauma to a massive prostate in the setting of heavy anticoagulation.    Plan:  -Irrigate as needed -would recommend removing Foley catheter as soon as it is appropriate as patient may be going home tomorrow -If hematuria persists would trend H&H -WIll need a cystoscopy as  an outpatient when he recovers from his cardiac issues   05/17/2020, 12:51 PM  Zara Council, PA-C   Thank you for involving me in this patient's care.  Please page with any further questions or concerns. SHANNON MCGOWAN

## 2020-05-17 NOTE — Telephone Encounter (Signed)
  Chronic Care Management   Outreach Note  05/17/2020 Name: Justin Griffith MRN: [id number] DOB: 18-Aug-1940  Referred by: Charlynne Cousins, MD Reason for referral : Appointment (RNCM: 2nd attempt: Follow up for Chronic Disease Management and Care Coordination. Per chart review the patient is currently inpatient status)   A second unsuccessful telephone outreach was attempted today. The patient was referred to the case management team for assistance with care management and care coordination.   Follow Up Plan: The care management team will reach out to the patient again over the next 30 days.   Noreene Larsson RN, MSN, [facility] Mobile: [phone]

## 2020-05-17 NOTE — Progress Notes (Signed)
PROGRESS NOTE    Justin Griffith  YJE:[id number] DOB: 1940-05-20 DOA: 05/16/2020 PCP: Charlynne Cousins, MD   Chief complaint.  Shortness of breath and chest pain. Brief Narrative:  Justin Griffith is a 80 y.o. male with medical history significant of CAD, non-STEMI, hypertension, hyperlipidemia, diabetes mellitus, COPD, asthma, stroke, former smoker, eosinophilic esophagitis, colon cancer, PVD, tremor, who presents with chest pain. Initial EKG in the emergency room showed Q wave from V1 to V5, no significant ST elevation.  Troponin was more than 27,000.  Patient was placed on heparin drip.  Heart cath was performed on 2/25, a drug-eluting stent was placed into LAD.   Assessment & Plan:   Principal Problem:   NSTEMI (non-ST elevated myocardial infarction) ([place]) Active Problems:   Hypertension associated with diabetes ([place])   Type 2 diabetes mellitus with proteinuria (HCC)   Hyperlipidemia associated with type 2 diabetes mellitus (HCC)   Gastroesophageal reflux disease without esophagitis   COPD (chronic obstructive pulmonary disease) (HCC)   History of CVA (cerebrovascular accident)   Leukocytosis   Hypercalcemia   Chronic diastolic CHF (congestive heart failure) ([place])  #1.  Acute non-ST elevation myocardial infarction. Acute on chronic diastolic congestive heart failure. Patient still has significant short of breath post heart cath, he was given 40 mg IV Lasix.  He had a 1800 mL urine output in 2 hours. Currently, he still has some short of breath, was placed on oxygen.  But he does not have significant hypoxemia. Obtain chest x-ray, also giving DuoNeb treatment as patient has chronic COPD. Continue heparin drip per cardiology. He will be also placed on antiplatelets treatment, statin. Followed closely in the progressive unit.  #2.  COPD. Currently patient does not have any bronchospasm.  He has some short of breath, will give DuoNeb treatment.  Resume all home inhalers.  3.  Type 2  diabetes. Recent A1c 6.9.  Continue current regimen.  4.  History of stroke. Continue aspirin, statin.  5.  Hematuria.  Secondary to traumatic Foley catheter. Urology following.  Bladder flushed.     DVT prophylaxis: Heparin Code Status: Full Family Communication:  Disposition Plan:  .   Status is: Inpatient  Remains inpatient appropriate because:Inpatient level of care appropriate due to severity of illness   Dispo: The patient is from: Home              Anticipated d/c is to: Home              Patient currently is not medically stable to d/c.   Difficult to place patient No        I/O last 3 completed shifts: In: 1121.1 [P.O.:240; I.V.:881.1] Out: 400 [Urine:400] Total I/O In: -  Out: 2325 [Urine:2325]     Consultants:   Cardiology  Procedures: Heart cath  Antimicrobials: None  Subjective: Patient developed significant short of breath postop.  He was given 40 mg IV Lasix, he had a 1800 mL urine output after that.  He still has some short of breath, but no hypoxia.  Chest pain has resolved since surgery. She had a Foley catheter anchored for the procedure, developed significant hematuria.  Seen by urology, Foley catheter was flushed. He denies any abdominal pain or nausea vomiting.  No fever or chills.  Objective: Vitals:   05/17/20 1230 05/17/20 1255 05/17/20 1300 05/17/20 1330  BP: (!) 189/106 (!) 209/111 (!) 192/60 (!) 163/78  Pulse: 67  82 67  Resp: 15  (!) 25 20  Temp:  TempSrc:      SpO2: 97%  96% 95%  Weight:      Height:        Intake/Output Summary (Last 24 hours) at 05/17/2020 1402 Last data filed at 05/17/2020 1100 Gross per 24 hour  Intake 1121.06 ml  Output 2325 ml  Net -1203.94 ml   Filed Weights   05/16/20 1728 05/17/20 0511 05/17/20 0718  Weight: 120.5 kg 117.9 kg 117.9 kg    Examination:  General exam: Appears calm and comfortable  Respiratory system: A few crackles in the base. Respiratory effort  normal. Cardiovascular system: S1 & S2 heard, RRR. No JVD, murmurs, rubs, gallops or clicks. No pedal edema. Gastrointestinal system: Abdomen is nondistended, soft and nontender. No organomegaly or masses felt. Normal bowel sounds heard. Central nervous system: Alert and oriented x2. No focal neurological deficits. Extremities: Symmetric 5 x 5 power. Skin: No rashes, lesions or ulcers Psychiatry: Mood & affect appropriate.     Data Reviewed: I have personally reviewed following labs and imaging studies  CBC: Recent Labs  Lab 05/16/20 1046 05/17/20 0533  WBC 14.6* 8.8  HGB 14.8 13.8  HCT 42.4 39.5  MCV 88.3 89.8  PLT 227 998   Basic Metabolic Panel: Recent Labs  Lab 05/16/20 1046 05/17/20 0533  NA 133* 137  K 3.7 3.8  CL 100 103  CO2 22 29  GLUCOSE 170* 150*  BUN 13 16  CREATININE 1.03 0.98  CALCIUM 10.7* 10.2   GFR: Estimated Creatinine Clearance: 77.4 mL/min (by C-G formula based on SCr of 0.98 mg/dL). Liver Function Tests: No results for input(s): AST, ALT, ALKPHOS, BILITOT, PROT, ALBUMIN in the last 168 hours. No results for input(s): LIPASE, AMYLASE in the last 168 hours. No results for input(s): AMMONIA in the last 168 hours. Coagulation Profile: Recent Labs  Lab 05/16/20 1254  INR 1.1   Cardiac Enzymes: No results for input(s): CKTOTAL, CKMB, CKMBINDEX, TROPONINI in the last 168 hours. BNP (last 3 results) No results for input(s): PROBNP in the last 8760 hours. HbA1C: Recent Labs    05/17/20 0533  HGBA1C 7.9*   CBG: Recent Labs  Lab 05/16/20 1639 05/16/20 2047 05/17/20 0941  GLUCAP 126* 157* 186*   Lipid Profile: Recent Labs    05/17/20 0533  CHOL 78  HDL 31*  LDLCALC 17  TRIG 152*  CHOLHDL 2.5   Thyroid Function Tests: No results for input(s): TSH, T4TOTAL, FREET4, T3FREE, THYROIDAB in the last 72 hours. Anemia Panel: No results for input(s): VITAMINB12, FOLATE, FERRITIN, TIBC, IRON, RETICCTPCT in the last 72 hours. Sepsis  Labs: No results for input(s): PROCALCITON, LATICACIDVEN in the last 168 hours.  Recent Results (from the past 240 hour(s))  SARS CORONAVIRUS 2 (TAT 6-24 HRS) Nasopharyngeal Nasopharyngeal Swab     Status: None   Collection Time: 05/16/20 12:54 PM   Specimen: Nasopharyngeal Swab  Result Value Ref Range Status   SARS Coronavirus 2 NEGATIVE NEGATIVE Final    Comment: (NOTE) SARS-CoV-2 target nucleic acids are NOT DETECTED.  The SARS-CoV-2 RNA is generally detectable in upper and lower respiratory specimens during the acute phase of infection. Negative results do not preclude SARS-CoV-2 infection, do not rule out co-infections with other pathogens, and should not be used as the sole basis for treatment or other patient management decisions. Negative results must be combined with clinical observations, patient history, and epidemiological information. The expected result is Negative.  Fact Sheet for Patients: SugarRoll.be  Fact Sheet for Healthcare Providers: https://www.woods-mathews.com/  This  test is not yet approved or cleared by the [place] and  has been authorized for detection and/or diagnosis of SARS-CoV-2 by FDA under an Emergency Use Authorization (EUA). This EUA will remain  in effect (meaning this test can be used) for the duration of the COVID-19 declaration under Se ction 564(b)(1) of the Act, 21 U.S.C. section 360bbb-3(b)(1), unless the authorization is terminated or revoked sooner.  Performed at [facility], [place] [address] [place], [place] [address]          Radiology Studies: DG Chest 2 View  Result Date: 05/16/2020 CLINICAL DATA:  Onset chest pain last night. EXAM: CHEST - 2 VIEW COMPARISON:  Single-view of the chest 03/28/2020. FINDINGS: Lungs clear. Heart size normal. No pneumothorax or pleural effusion. Loop recorder noted. No acute or focal bony abnormality. IMPRESSION: No acute disease.  Electronically Signed   By: Inge Rise M.D.   On: 05/16/2020 14:05        Scheduled Meds: . [MAR Hold] acyclovir  400 mg Oral BID  . [START ON 05/18/2020] aspirin  81 mg Oral Daily  . [MAR Hold] aspirin EC  81 mg Oral Daily  . [MAR Hold] carvedilol  25 mg Oral BID WC  . [MAR Hold] cholecalciferol  2,000 Units Oral QHS  . [MAR Hold] clobetasol cream  1 application Topical BID  . [MAR Hold] cloNIDine  0.1 mg Oral BID  . [MAR Hold] clotrimazole   Topical BID  . [MAR Hold] donepezil  5 mg Oral QHS  . [MAR Hold] furosemide  20 mg Oral Daily  . hydrALAZINE      . [MAR Hold] insulin aspart  0-5 Units Subcutaneous QHS  . [MAR Hold] insulin aspart  0-9 Units Subcutaneous TID WC  . [MAR Hold] insulin glargine  15 Units Subcutaneous QHS  . [MAR Hold] ipratropium-albuterol  3 mL Nebulization BID  . ipratropium-albuterol  3 mL Nebulization Once  . [MAR Hold] irbesartan  300 mg Oral Daily  . labetalol  100 mg Oral TID  . [MAR Hold] mupirocin ointment  1 application Topical BID  . [MAR Hold] neomycin-polymyxin b-dexamethasone  2 drop Both Eyes BID  . [MAR Hold] primidone  50 mg Oral BID  . [MAR Hold] rosuvastatin  40 mg Oral QHS  . [MAR Hold] sodium chloride flush  3 mL Intravenous Q12H  . sodium chloride flush  3 mL Intravenous Q12H  . [MAR Hold] tamsulosin  0.4 mg Oral Daily  . ticagrelor  90 mg Oral BID  . [MAR Hold] tiotropium  18 mcg Inhalation Daily  . [MAR Hold] vitamin B-12  2,000 mcg Oral QHS   Continuous Infusions: . sodium chloride    . sodium chloride    . sodium chloride 1 mL/kg/hr (05/16/20 2215)  . sodium chloride    . heparin Stopped (05/17/20 0731)     LOS: 1 day    Time spent: 35 minutes    Sharen Hones, MD [facility]   To contact the attending provider between 7A-7P or the covering provider during after hours 7P-7A, please log into the web site [email or web address] and access using universal [place] password for that web site. If you do not have  the password, please call the hospital operator.  05/17/2020, 2:02 PM

## 2020-05-17 NOTE — OR Nursing (Signed)
PT noted to become tachycardic and dyspneic when standing to urinate. HR in the 120's-140's. MD made aware.

## 2020-05-17 NOTE — Progress Notes (Signed)
After interventions for previous c/o SOB, PT still c/o SOB, Dr.[name] notified. Orders for 8-mg lasix stat and BNP ordered.    UPDATE: after lasix administered, pt sleeping but arousable.

## 2020-05-17 NOTE — Progress Notes (Signed)
Pt c/o SOB, stated he feels like "he is gasping for air' O2 sats 97% on 3L.No use of accessory muscles.  MD made aware. Orders to administered duoneb and ativan. Will continue to monitor.

## 2020-05-17 NOTE — OR Nursing (Signed)
PT with lots of blood in foley bad. MD made aware, he is requesting urology consult

## 2020-05-18 ENCOUNTER — Inpatient Hospital Stay: Payer: Medicare Other

## 2020-05-18 DIAGNOSIS — N179 Acute kidney failure, unspecified: Secondary | ICD-10-CM

## 2020-05-18 DIAGNOSIS — Z8673 Personal history of transient ischemic attack (TIA), and cerebral infarction without residual deficits: Secondary | ICD-10-CM

## 2020-05-18 DIAGNOSIS — E1159 Type 2 diabetes mellitus with other circulatory complications: Secondary | ICD-10-CM

## 2020-05-18 DIAGNOSIS — I152 Hypertension secondary to endocrine disorders: Secondary | ICD-10-CM

## 2020-05-18 DIAGNOSIS — R4182 Altered mental status, unspecified: Secondary | ICD-10-CM

## 2020-05-18 LAB — GLUCOSE, CAPILLARY
Glucose-Capillary: 190 mg/dL — ABNORMAL HIGH (ref 70–99)
Glucose-Capillary: 202 mg/dL — ABNORMAL HIGH (ref 70–99)
Glucose-Capillary: 207 mg/dL — ABNORMAL HIGH (ref 70–99)
Glucose-Capillary: 139 mg/dL — ABNORMAL HIGH (ref 70–99)
Glucose-Capillary: 164 mg/dL — ABNORMAL HIGH (ref 70–99)

## 2020-05-18 LAB — URINALYSIS, COMPLETE (UACMP) WITH MICROSCOPIC
RBC / HPF: >50 RBC/hpf — ABNORMAL HIGH (ref 0–5)
Specific Gravity, Urine: 1.016 (ref 1.005–1.030)
Squamous Epithelial / HPF: NONE SEEN (ref 0–5)
WBC, UA: >50 WBC/hpf — ABNORMAL HIGH (ref 0–5)

## 2020-05-18 LAB — CBC WITH DIFFERENTIAL/PLATELET
Abs Immature Granulocytes: 0.06 10*3/uL (ref 0.00–0.07)
Basophils Absolute: 0 10*3/uL (ref 0.0–0.1)
Basophils Relative: 0 %
Eosinophils Absolute: 0 10*3/uL (ref 0.0–0.5)
Eosinophils Relative: 0 %
HCT: 39.5 % (ref 39.0–52.0)
Hemoglobin: 13.7 g/dL (ref 13.0–17.0)
Immature Granulocytes: 1 %
Lymphocytes Relative: 15 %
Lymphs Abs: 1.5 10*3/uL (ref 0.7–4.0)
MCH: 30.9 pg (ref 26.0–34.0)
MCHC: 34.7 g/dL (ref 30.0–36.0)
MCV: 89.2 fL (ref 80.0–100.0)
Monocytes Absolute: 1.3 10*3/uL — ABNORMAL HIGH (ref 0.1–1.0)
Monocytes Relative: 12 %
Neutro Abs: 7.6 10*3/uL (ref 1.7–7.7)
Neutrophils Relative %: 72 %
Platelets: 206 10*3/uL (ref 150–400)
RBC: 4.43 MIL/uL (ref 4.22–5.81)
RDW: 13.2 % (ref 11.5–15.5)
WBC: 10.5 10*3/uL (ref 4.0–10.5)
nRBC: 0 % (ref 0.0–0.2)

## 2020-05-18 LAB — BASIC METABOLIC PANEL
Anion gap: 11 (ref 5–15)
BUN: 24 mg/dL — ABNORMAL HIGH (ref 8–23)
CO2: 27 mmol/L (ref 22–32)
Calcium: 10.4 mg/dL — ABNORMAL HIGH (ref 8.9–10.3)
Chloride: 99 mmol/L (ref 98–111)
Creatinine, Ser: 1.49 mg/dL — ABNORMAL HIGH (ref 0.61–1.24)
GFR, Estimated: 47 mL/min — ABNORMAL LOW (ref >60)
Glucose, Bld: 188 mg/dL — ABNORMAL HIGH (ref 70–99)
Potassium: 3.4 mmol/L — ABNORMAL LOW (ref 3.5–5.1)
Sodium: 137 mmol/L (ref 135–145)

## 2020-05-18 LAB — MAGNESIUM: Magnesium: 1.6 mg/dL — ABNORMAL LOW (ref 1.7–2.4)

## 2020-05-18 MED ORDER — CHLORHEXIDINE GLUCONATE CLOTH 2 % EX PADS
6.0000 | MEDICATED_PAD | Freq: Every day | CUTANEOUS | Status: DC
  Administered 2020-05-18 – 2020-05-20 (×3): 6 via TOPICAL

## 2020-05-18 MED ORDER — HEPARIN SODIUM (PORCINE) 5000 UNIT/ML IJ SOLN
5000.0000 [IU] | Freq: Three times a day (TID) | INTRAMUSCULAR | Status: DC
  Administered 2020-05-18 – 2020-05-21 (×9): 5000 [IU] via SUBCUTANEOUS
  Filled 2020-05-18 (×8): qty 1

## 2020-05-18 MED ORDER — SODIUM CHLORIDE 0.9 % IV BOLUS
1000.0000 mL | Freq: Once | INTRAVENOUS | Status: AC
  Administered 2020-05-18: 1000 mL via INTRAVENOUS

## 2020-05-18 MED ORDER — SODIUM CHLORIDE 0.9 % BOLUS PEDS: 500.0000 mL | Freq: Once | INTRAVENOUS | Status: DC

## 2020-05-18 MED ORDER — SODIUM CHLORIDE 0.9 % IV BOLUS: 500.0000 mL | Freq: Once | INTRAVENOUS | Status: DC

## 2020-05-18 MED ORDER — SODIUM CHLORIDE 0.9 % IV SOLN
2.0000 g | INTRAVENOUS | Status: DC
  Administered 2020-05-18 – 2020-05-19 (×2): 2 g via INTRAVENOUS
  Filled 2020-05-18 (×2): qty 2
  Filled 2020-05-18: qty 20

## 2020-05-18 MED ORDER — POTASSIUM CHLORIDE 20 MEQ PO PACK
40.0000 meq | PACK | Freq: Once | ORAL | Status: AC
  Administered 2020-05-18: 40 meq via ORAL
  Filled 2020-05-18: qty 2

## 2020-05-18 MED ORDER — MAGNESIUM SULFATE 2 GM/50ML IV SOLN
2.0000 g | Freq: Once | INTRAVENOUS | Status: AC
  Administered 2020-05-18: 2 g via INTRAVENOUS
  Filled 2020-05-18: qty 50

## 2020-05-18 NOTE — Consult Note (Signed)
NEURO HOSPITALIST CONSULT NOTE   Requestig physician: Dr. Roosevelt Locks  Reason for Consult: AMS  History obtained from:  Chart  HPI:                                                                                                                                          Justin Griffith is an 80 y.o. male former smoker with a PMHx ofCAD, HLD, HTN, DM, COPD, stroke, eosinophilic esophagitis, colon cancer, PVD and tremor, who presented to the hospital with chest pain. Troponin was markedly elevated at 27,000. He was diagnosed with NSTEMI, placed on heparin drip followed by cardiac catheterization on Friday with placemen of a drug eluting stent within his LAD. He also has been diagnosed with acute on chronic diastolic CHF this admission and is being treated with Lasix. He is currently on DAPT and a statin.   This afternoon, he developed hypotension with BP of 75/52. He was administered a normal saline bolus. STAT EKG revealed an anterior myocardial infarction, with ST changes consistent with natural progression post MI.  He then developed significant altered mental status, becoming very confused, incoherent and agitated.  It was thought that this could be due to hypotension.  Coverage with Rocephin was started for possible UTI. BUN and Cr trended up today to 24 and 1.49, respectively.   CT head revealed no acute intracranial abnormality. Periventricular white matter hypodensities consistent with sequela of chronic microvascular ischemic disease were noted.    Past Medical History:  Diagnosis Date  . Arthritis   . Asthma 2000  . Back pain   . Colon polyp 2012  . COPD (chronic obstructive pulmonary disease) ([place])   . Emphysema of lung ([place])   . Heart murmur   . Hernia 2000  . Hyperlipidemia   . Hypertension   . Personal history of malignant neoplasm of large intestine   . Personal history of tobacco use, presenting hazards to health   . Sleep apnea   . Special screening for  malignant neoplasms, colon   . Type 2 diabetes mellitus with proteinuria ([place]) 10/11/2014    Past Surgical History:  Procedure Laterality Date  . CARDIAC CATHETERIZATION Left 09/25/2015   Procedure: Left Heart Cath and Coronary Angiography;  Surgeon: Yolonda Kida, MD;  Location: [facility];  Service: Cardiovascular;  Laterality: Left;  . CHOLECYSTECTOMY  1970  . COLON SURGERY  07-16-99   sigmoid colon resection with primary anastomosis, chemotherapy for metastatic disease  . COLONOSCOPY  2001, 2012   Dr Bary Castilla, tubular adenoma of the cecum and ascending colon in 2012.  Marland Kitchen COLONOSCOPY WITH PROPOFOL N/A 02/12/2016   Procedure: COLONOSCOPY WITH PROPOFOL;  Surgeon: Robert Bellow, MD;  Location: [facility];  Service: Endoscopy;  Laterality: N/A;  . CORONARY  STENT INTERVENTION N/A 05/17/2020   Procedure: CORONARY STENT INTERVENTION;  Surgeon: Yolonda Kida, MD;  Location: [facility];  Service: Cardiovascular;  Laterality: N/A;  LAD  . ESOPHAGOGASTRODUODENOSCOPY (EGD) WITH PROPOFOL N/A 02/12/2016   Procedure: ESOPHAGOGASTRODUODENOSCOPY (EGD) WITH PROPOFOL;  Surgeon: Robert Bellow, MD;  Location: [facility] ENDOSCOPY;  Service: Endoscopy;  Laterality: N/A;  . HERNIA REPAIR Right    right inguinal hernia repair  . JOINT REPLACEMENT Bilateral    knee replacement  . KNEE SURGERY Bilateral 2010  . LEFT HEART CATH AND CORONARY ANGIOGRAPHY N/A 05/17/2020   Procedure: LEFT HEART CATH AND CORONARY ANGIOGRAPHY possible PCI and stent;  Surgeon: Yolonda Kida, MD;  Location: [facility];  Service: Cardiovascular;  Laterality: N/A;  . MYRINGOTOMY WITH TUBE PLACEMENT Bilateral 08/16/2014   Procedure: MYRINGOTOMY WITH TUBE PLACEMENT;  Surgeon: Carloyn Manner, MD;  Location: [facility] ORS;  Service: ENT;  Laterality: Bilateral;  . PERIPHERAL VASCULAR CATHETERIZATION Right 09/04/2015   Procedure: Lower Extremity Angiography;  Surgeon: Katha Cabal, MD;  Location: [facility];  Service: Cardiovascular;  Laterality: Right;  . PERIPHERAL VASCULAR CATHETERIZATION  09/04/2015   Procedure: Lower Extremity Intervention;  Surgeon: Katha Cabal, MD;  Location: [facility];  Service: Cardiovascular;;  . TONSILLECTOMY    . TYMPANOSTOMY TUBE PLACEMENT      Family History  Problem Relation Age of Onset  . Diabetes Father   . Esophageal cancer Mother   . Alzheimer's disease Paternal Uncle               Social History:  reports that he quit smoking about 31 years ago. His smoking use included cigarettes. He has a 30.00 pack-year smoking history. He has never used smokeless tobacco. He reports that he does not drink alcohol and does not use drugs.  Allergies  Allergen Reactions  . Dulaglutide Diarrhea and Other (See Comments)  . Liraglutide Diarrhea and Other (See Comments)  . Farxiga [Dapagliflozin] Rash  . Jardiance [Empagliflozin] Rash    MEDICATIONS:                                                                                                                     Scheduled: . acyclovir  400 mg Oral BID  . aspirin  81 mg Oral Daily  . aspirin EC  81 mg Oral Daily  . Chlorhexidine Gluconate Cloth  6 each Topical Daily  . cholecalciferol  2,000 Units Oral QHS  . clobetasol cream  1 application Topical BID  . clotrimazole   Topical BID  . donepezil  5 mg Oral QHS  . heparin injection (subcutaneous)  5,000 Units Subcutaneous Q8H  . insulin aspart  0-5 Units Subcutaneous QHS  . insulin aspart  0-9 Units Subcutaneous TID WC  . insulin glargine  15 Units Subcutaneous QHS  . ipratropium-albuterol  3 mL Nebulization Once  . mupirocin ointment  1 application Topical BID  . neomycin-polymyxin b-dexamethasone  2 drop Both Eyes BID  .  primidone  50 mg Oral BID  . rosuvastatin  40 mg Oral QHS  . sodium chloride flush  3 mL Intravenous Q12H  . sodium chloride flush  3 mL Intravenous Q12H  . tamsulosin  0.4 mg Oral Daily  . ticagrelor  90  mg Oral BID  . tiotropium  18 mcg Inhalation Daily  . vitamin B-12  2,000 mcg Oral QHS   Continuous: . sodium chloride    . cefTRIAXone (ROCEPHIN)  IV 2 g (05/18/20 1548)     ROS:                                                                                                                                       As per HPI. The patient has no additional complaints.    Blood pressure 136/65, pulse (!) 56, temperature 97.8 F (36.6 C), temperature source Oral, resp. rate 17, height '5\' 10"'  (1.778 m), weight 117.9 kg, SpO2 94 %.   General Examination:                                                                                                       Physical Exam  HEENT-  [place]/AT  Lungs: Respirations unlabored  Extremities- Chronic pigmentary changes to skin of distal lower extremities.    Neurological Examination Mental Status: Drowsy with decreased attention. The patient is partially oriented. Speech is fluent with intact comprehension of basic commands. Able to name common objects. No dysarthria. He can recite the months of the year forwards but cannot do so backwards. He became somnolent towards the end of the exam but was arousable and communicative, asking for the remote on departure of the Neurologist.   Cranial Nerves: II: Visual fields confounded by poor attention. There is equivocal left upper quadrant visual field defect of left eye without homonymous finding on the right. PERRL.   III,IV, VI: Right sided ptosis is mild. EOMI without nystagmus. V,VII: Smile symmetric, facial temp sensation equal bilaterally VIII: Hearing intact to voice IX,X: No hypophonia XI: Symmetric XII: Midline tongue extension Motor: Right : Upper extremity   5/5    Left:     Upper extremity   5/5  Lower extremity   5/5     Lower extremity   5/5 Normal tone throughout; no atrophy noted Sensory: Temp and light touch intact proximally in BUE and BLE. No extinction to DSS Deep Tendon Reflexes:  Hypoactive throughout Cerebellar: No ataxia with FNF bilaterally  Gait: Deferred   Lab  Results: Basic Metabolic Panel: Recent Labs  Lab 05/16/20 1046 05/17/20 0533 05/18/20 0426  NA 133* 137 137  K 3.7 3.8 3.4*  CL 100 103 99  CO2 '22 29 27  ' GLUCOSE 170* 150* 188*  BUN 13 16 24*  CREATININE 1.03 0.98 1.49*  CALCIUM 10.7* 10.2 10.4*  MG  --   --  1.6*    CBC: Recent Labs  Lab 05/16/20 1046 05/17/20 0533 05/18/20 0426  WBC 14.6* 8.8 10.5  NEUTROABS  --   --  7.6  HGB 14.8 13.8 13.7  HCT 42.4 39.5 39.5  MCV 88.3 89.8 89.2  PLT 227 195 206    Cardiac Enzymes: No results for input(s): CKTOTAL, CKMB, CKMBINDEX, TROPONINI in the last 168 hours.  Lipid Panel: Recent Labs  Lab 05/17/20 0533  CHOL 78  TRIG 152*  HDL 31*  CHOLHDL 2.5  VLDL 30  LDLCALC 17    Imaging: DG Chest 1 View  Result Date: 05/17/2020 CLINICAL DATA:  Shortness of breath. EXAM: CHEST  1 VIEW COMPARISON:  05/16/2020 FINDINGS: Heart size is normal. There is minimal subsegmental atelectasis at the LEFT lung base. No focal consolidations or pleural effusions. No pneumothorax or edema. IMPRESSION: Minimal LEFT lower lobe atelectasis. Electronically Signed   By: Nolon Nations M.D.   On: 05/17/2020 16:10   CT HEAD WO CONTRAST  Result Date: 05/18/2020 CLINICAL DATA:  Mental status change EXAM: CT HEAD WITHOUT CONTRAST TECHNIQUE: Contiguous axial images were obtained from the base of the skull through the vertex without intravenous contrast. COMPARISON:  March 28, 2020 FINDINGS: Brain: No evidence of acute infarction, hemorrhage, hydrocephalus, extra-axial collection or mass lesion/mass effect. Periventricular white matter hypodensities consistent with sequela of chronic microvascular ischemic disease. Vascular: Vascular calcifications of the carotid siphons and vertebral arteries. Skull: Normal. Negative for fracture or focal lesion. Sinuses/Orbits: Chronic bilateral mastoid effusions. No acute finding.  Other: None. IMPRESSION: No acute intracranial abnormality. Electronically Signed   By: Valentino Saxon MD   On: 05/18/2020 14:39   CARDIAC CATHETERIZATION  Result Date: 05/17/2020  2nd Diag lesion is 100% stenosed.  Mid LAD lesion is 85% stenosed.  A drug-eluting stent was successfully placed using a STENT RESOLUTE ONYX 3.0X34.  Post intervention, there is a 0% residual stenosis.  Prox RCA lesion is 100% stenosed.  1st LPL lesion is 70% stenosed.  LPDA lesion is 50% stenosed.  Ost LM to Prox LAD lesion is 10% stenosed.  Mild to moderately reduced left ventricular function with anterior apical lateral hypokinesis ejection fraction between forty and 45%  Conclusion Successful cardiac cath right radial approach Mild to moderate depressed left ventricular function with anterior apical lateral hypokinesis ejection fraction around 40 to 45% Coronaries Left main large free of disease moderate calcification LAD large moderate calcification proximally diagonal 100% occluded mid LAD disease 85% Circumflex large left dominant minor irregularities Mild to moderate disease distal left PDA left PL No significant collaterals Intervention Successful PCI and stent to mid LAD with DES 3.0 x 34 mm resolute Onyx Postdilated with a 3.5 x 15 mm [place] to 16 atm Lesion reduced from 85 down to 0% TIMI-3 flow was maintained throughout the procedure No complications    Assessment: 80 year old male with AMS following cardiac catheterization with stent placement 1. Exam reveals a drowsy to somnolent patient with poor concentration, but no focal motor or sensory deficit and no aphasia.  2. Overall impression is that the etiology for his confusion is multifactorial, with hospital  delirium likely a contributing factor. No findings on exam to militate in favor of an acute stroke. However, his acute hypotensive episode just prior to onset of AMS could have resulted in bilateral watershed infarctions, which could result in  confusion with a non-lateralizing exam.   Recommendations: 1. MRI brain 2. Avoid sedating medications.  3. Hydration protocol and management of increasing BUN and Cr per primary team.  4. On DAPT and statin per Cardiology.     Electronically signed: Dr. Kerney Elbe 05/18/2020, 9:15 PM

## 2020-05-18 NOTE — Progress Notes (Signed)
Patient has mental status change, He has became confused, incoherent, and agitated. MD was notified, Head Ct, EKG, And UA were ordered.

## 2020-05-18 NOTE — Progress Notes (Signed)
Foley catheter was irrigated with 150 ml NS and several large clots were expelled. Urine remains bloody.

## 2020-05-18 NOTE — TOC Initial Note (Signed)
Transition of Care [facility]) - Initial/Assessment Note    Patient Details  Name: Justin Griffith MRN: [id number] Date of Birth: 06-Oct-1940  Transition of Care [facility]) CM/SW Contact:    Kerin Salen, RN Phone Number: 05/18/2020, 12:26 PM  Clinical Narrative: Spoke with patient, who is hard of hearing, states he lives with his wife. Drives himself to appointments, pick up medications from the [employer] in [place]. Do have [facility] nurse to come to house to assist with ADL's not sure name of agency. Both he and his wife cooks. Have a walker with wheels, use sometimes.                  Expected Discharge Plan: Home/Self Care Barriers to Discharge: Continued Medical Work up   Patient Goals and CMS Choice Patient states their goals for this hospitalization and ongoing recovery are:: To return home with wife.   Choice offered to / list presented to : NA  Expected Discharge Plan and Services Expected Discharge Plan: Home/Self Care In-house Referral: Clinical Social Work Discharge Planning Services: NA Post Acute Care Choice: NA Living arrangements for the past 2 months: Single Family Home                 DME Arranged: N/A DME Agency: NA       HH Arranged: Nurse's Aide [place] Agency: Other - See comment (Patient is not sure, will research chart.)        Prior Living Arrangements/Services Living arrangements for the past 2 months: Single Family Home Lives with:: Spouse Patient language and need for interpreter reviewed:: Yes Do you feel safe going back to the place where you live?: Yes      Need for Family Participation in Patient Care: Yes (Comment) Care giver support system in place?: Yes (comment) Current home services: Homehealth aide Criminal Activity/Legal Involvement Pertinent to Current Situation/Hospitalization: No - Comment as needed  Activities of Daily Living Home Assistive Devices/Equipment: Walker (specify type) ADL Screening (condition at time of  admission) Patient's cognitive ability adequate to safely complete daily activities?: Yes Is the patient deaf or have difficulty hearing?: Yes Does the patient have difficulty seeing, even when wearing glasses/contacts?: No Does the patient have difficulty concentrating, remembering, or making decisions?: No Patient able to express need for assistance with ADLs?: Yes Does the patient have difficulty dressing or bathing?: No Independently performs ADLs?: Yes (appropriate for developmental age) Does the patient have difficulty walking or climbing stairs?: No Weakness of Legs: Both Weakness of Arms/Hands: None  Permission Sought/Granted Permission sought to share information with : Case Manager Permission granted to share information with : No              Emotional Assessment Appearance:: Appears stated age Attitude/Demeanor/Rapport: Unable to Assess Affect (typically observed): Flat Orientation: : Oriented to Self,Oriented to Place,Oriented to  Time Alcohol / Substance Use: Not Applicable Psych Involvement: No (comment)  Admission diagnosis:  NSTEMI (non-ST elevated myocardial infarction) [facility]) [I21.4] Patient Active Problem List   Diagnosis Date Noted  . AKI (acute kidney injury) ([place]) 05/18/2020  . Leukocytosis 05/16/2020  . Hypercalcemia 05/16/2020  . Acute on chronic diastolic CHF (congestive heart failure) ([place]) 05/16/2020  . At high risk for falls 05/08/2020  . History of cold sores 05/06/2020  . Hypomagnesemia 03/29/2020  . NSTEMI (non-ST elevated myocardial infarction) ([place]) 03/28/2020  . Hypokalemia 03/28/2020  . Elevated TSH 02/25/2020  . Poor mobility 02/02/2020  . Coronary artery disease 01/23/2020  . History  of CVA (cerebrovascular accident) 01/23/2020  . Memory changes 01/01/2020  . Chronic venous insufficiency 05/23/2019  . B12 deficiency 04/23/2019  . Constipation 04/21/2019  . Tremor 11/14/2018  . Morbid obesity ([place]) 09/07/2017  . COPD (chronic  obstructive pulmonary disease) ([place]) 04/29/2017  . Intertrigo 02/02/2017  . BPH (benign prostatic hyperplasia) 07/23/2016  . Advanced care planning/counseling discussion 07/23/2016  . Eosinophilic esophagitis 14/23/9532  . PAD (peripheral artery disease) ([place]) 01/08/2016  . Gastroesophageal reflux disease without esophagitis 01/06/2016  . Hyperlipidemia associated with type 2 diabetes mellitus ([place]) 01/16/2015  . Sleep apnea 01/16/2015  . BMI 40.0-44.9, adult ([place]) 01/16/2015  . Hypertension associated with diabetes ([place]) 10/11/2014  . Type 2 diabetes mellitus with proteinuria ([place]) 10/11/2014  . Back pain 10/11/2014  . History of colon cancer    PCP:  Charlynne Cousins, MD Pharmacy:   [facility], [address] [place] [address] Phone: [phone] Fax: [place], [place] [place] [address] Phone: [phone] Fax: [phone]     Social Determinants of Health (SDOH) Interventions    Readmission Risk Interventions Readmission Risk Prevention Plan 05/18/2020  Transportation Screening Complete  HRI or Home Care Consult Complete  Social Work Consult for [place] Planning/Counseling Complete  Palliative Care Screening Not Applicable  Medication Review Press photographer) Complete  Some recent data might be hidden

## 2020-05-18 NOTE — Progress Notes (Signed)
Pt complaining of perineal pain 9/10 at beginning of shift and requesting foley removal. Given morphine x1 with no relief. Irrigated foley w/ 50cc of water and foley drained 500cc of dark red urine right after. Pt stated pain decreased after irrigation. Also noticed a few small clots in foley bag.

## 2020-05-18 NOTE — Progress Notes (Signed)
OT Cancellation Note  Patient Details Name: Justin Griffith MRN: [id number] DOB: 1941/03/12   Cancelled Treatment:    Reason Eval/Treat Not Completed: Patient at procedure or test/ unavailable;Medical issues which prohibited therapy  OT consult received and chart reviewed. Pt's nursing team in process of moving pt from 248 to 244 d/t pt confusion. In addition, per chart review, pt with RR called today around 1430. Will hold OT evaluation at this time and f/u next available date/time as appropriate/able. Thank you.  Gerrianne Scale, [place], OTR/L ascom [phone] 05/18/20, 4:56 PM

## 2020-05-18 NOTE — Progress Notes (Signed)
[facility] Cardiology    SUBJECTIVE: Patient resting comfortably improved shortness of breath would like to have the Foley discontinued complains of dysuria and mild hematuria we will have the patient increase activity follow-up renal function denies any chest pain   Vitals:   05/17/20 1949 05/17/20 2310 05/18/20 0414 05/18/20 0802  BP: (!) 148/65 (!) 196/103 101/60 (!) 137/52  Pulse: 67 88 61 (!) 57  Resp: (!) '21  18 18  ' Temp: (!) 97.4 F (36.3 C)  (!) 97.5 F (36.4 C) 98 F (36.7 C)  TempSrc: Oral  Oral Oral  SpO2: 97%  94% 97%  Weight:      Height:         Intake/Output Summary (Last 24 hours) at 05/18/2020 2563 Last data filed at 05/17/2020 2310 Gross per 24 hour  Intake 870 ml  Output 3300 ml  Net -2430 ml      PHYSICAL EXAM  General: Well developed, well nourished, in no acute distress HEENT:  Normocephalic and atramatic Neck:  No JVD.  Lungs: Clear bilaterally to auscultation and percussion. Heart: HRRR . Normal S1 and S2 without gallops or murmurs.  Abdomen: Bowel sounds are positive, abdomen soft and non-tender  Msk:  Back normal, normal gait. Normal strength and tone for age. Extremities: No clubbing, cyanosis or edema.   Neuro: Alert and oriented X 3. Psych:  Good affect, responds appropriately   LABS: Basic Metabolic Panel: Recent Labs    05/17/20 0533 05/18/20 0426  NA 137 137  K 3.8 3.4*  CL 103 99  CO2 29 27  GLUCOSE 150* 188*  BUN 16 24*  CREATININE 0.98 1.49*  CALCIUM 10.2 10.4*  MG  --  1.6*   Liver Function Tests: No results for input(s): AST, ALT, ALKPHOS, BILITOT, PROT, ALBUMIN in the last 72 hours. No results for input(s): LIPASE, AMYLASE in the last 72 hours. CBC: Recent Labs    05/17/20 0533 05/18/20 0426  WBC 8.8 10.5  NEUTROABS  --  7.6  HGB 13.8 13.7  HCT 39.5 39.5  MCV 89.8 89.2  PLT 195 206   Cardiac Enzymes: No results for input(s): CKTOTAL, CKMB, CKMBINDEX, TROPONINI in the last 72 hours. BNP: Invalid input(s):  POCBNP D-Dimer: No results for input(s): DDIMER in the last 72 hours. Hemoglobin A1C: Recent Labs    05/17/20 0533  HGBA1C 7.9*   Fasting Lipid Panel: Recent Labs    05/17/20 0533  CHOL 78  HDL 31*  LDLCALC 17  TRIG 152*  CHOLHDL 2.5   Thyroid Function Tests: No results for input(s): TSH, T4TOTAL, T3FREE, THYROIDAB in the last 72 hours.  Invalid input(s): FREET3 Anemia Panel: No results for input(s): VITAMINB12, FOLATE, FERRITIN, TIBC, IRON, RETICCTPCT in the last 72 hours.  DG Chest 1 View  Result Date: 05/17/2020 CLINICAL DATA:  Shortness of breath. EXAM: CHEST  1 VIEW COMPARISON:  05/16/2020 FINDINGS: Heart size is normal. There is minimal subsegmental atelectasis at the LEFT lung base. No focal consolidations or pleural effusions. No pneumothorax or edema. IMPRESSION: Minimal LEFT lower lobe atelectasis. Electronically Signed   By: Nolon Nations M.D.   On: 05/17/2020 16:10   DG Chest 2 View  Result Date: 05/16/2020 CLINICAL DATA:  Onset chest pain last night. EXAM: CHEST - 2 VIEW COMPARISON:  Single-view of the chest 03/28/2020. FINDINGS: Lungs clear. Heart size normal. No pneumothorax or pleural effusion. Loop recorder noted. No acute or focal bony abnormality. IMPRESSION: No acute disease. Electronically Signed   By: Inge Rise  M.D.   On: 05/16/2020 14:05   CARDIAC CATHETERIZATION  Result Date: 05/17/2020  2nd Diag lesion is 100% stenosed.  Mid LAD lesion is 85% stenosed.  A drug-eluting stent was successfully placed using a STENT RESOLUTE ONYX 3.0X34.  Post intervention, there is a 0% residual stenosis.  Prox RCA lesion is 100% stenosed.  1st LPL lesion is 70% stenosed.  LPDA lesion is 50% stenosed.  Ost LM to Prox LAD lesion is 10% stenosed.  Mild to moderately reduced left ventricular function with anterior apical lateral hypokinesis ejection fraction between forty and 45%  Conclusion Successful cardiac cath right radial approach Mild to moderate  depressed left ventricular function with anterior apical lateral hypokinesis ejection fraction around 40 to 45% Coronaries Left main large free of disease moderate calcification LAD large moderate calcification proximally diagonal 100% occluded mid LAD disease 85% Circumflex large left dominant minor irregularities Mild to moderate disease distal left PDA left PL No significant collaterals Intervention Successful PCI and stent to mid LAD with DES 3.0 x 34 mm resolute Onyx Postdilated with a 3.5 x 15 mm [place] to 16 atm Lesion reduced from 85 down to 0% TIMI-3 flow was maintained throughout the procedure No complications     Echo pending after discharge  TELEMETRY: Sinus bradycardia rate of 60:  ASSESSMENT AND PLAN:  Principal Problem:   NSTEMI (non-ST elevated myocardial infarction) (HCC) Active Problems:   Hypertension associated with diabetes (HCC)   Type 2 diabetes mellitus with proteinuria (H[facility])   Hyperlipidemia associated with type 2 diabetes mellitus (H[facility])   Gastroesophageal reflux disease without esophagitis   COPD (chronic obstructive pulmonary disease) (H[facility])   History of CVA (cerebrovascular accident)   Leukocytosis   Hypercalcemia   Acute on chronic diastolic CHF (congestive heart failure) ([place])    Plan Status post cardiac cath PCI and stent continue aspirin Brilinta Agree with aspirin Brilinta Crestor Coreg irbesartan Aggressive blood pressure management and control ibesartan Coreg clonidine hydralazine Recommend starting Imdur hydralazine Cardiomyopathy mild heart failure Imdur/hydralazine ibesartan Coreg we will add Lasix Renal insufficiency we will follow up BUN creatinine and GFR to see if renal function improves Continue diabetes management and control Recommend sleep study CPAP if indicated weight loss Discontinue Foley be sure the patient can void without hematuria Inhalers as necessary for COPD consider follow-up with pulmonary Evidence of acute systolic and  diastolic dysfunction we will institute Lasix therapy Agree with reflux therapy omeprazole or Protonix as necessary Consider discharge within the next 24 to 48 hours Follow-up with cardiology 1 to 2 weeks  Cardiac medications preliminary on discharge  Coreg 25 mg twice a day Aspirin 81 mg daily Brilinta 90 mg twice a day Crestor 40 mg daily Imdur 30 mg daily Hydralazine 25 mg 3 times a day Irbesartan 300 mg daily Clonidine 0.1 twice a day Labetalol 100 mg twice a day as needed systolic blood pressure above 150 Lasix 20 mg daily     Yolonda Kida, MD 05/18/2020 9:28 AM

## 2020-05-18 NOTE — Progress Notes (Signed)
Patient has c/o feeling like he cant breathe, Oxygen saturation 96% on room air. Patient was found to be hypotensive at 75/52. MD was notified and 1 Liter bolus was given per MD order.

## 2020-05-18 NOTE — Progress Notes (Signed)
PT Cancellation Note  Patient Details Name: Justin Griffith MRN: [id number] DOB: April 25, 1940   Cancelled Treatment:    Reason Eval/Treat Not Completed: Medical issues which prohibited therapy (Chart reviewed, evaluation attempted. MD in room, reports recent RR, asks author to defer PT evaluation to later date.)   Bonham Zingale C 05/18/2020, 2:52 PM

## 2020-05-18 NOTE — Progress Notes (Signed)
Patient transferred to room 44 in order to be closer to the nurses station.

## 2020-05-18 NOTE — Progress Notes (Addendum)
PROGRESS NOTE    Justin Griffith  CVE:[id number] DOB: 1940-07-30 DOA: 05/16/2020 PCP: Charlynne Cousins, MD   Chief complaint.  Shortness of breath and chest pain. Brief Narrative:  Justin Griffith a 80 y.o.malewith medical history significant ofCAD, non-STEMI, hypertension, hyperlipidemia, diabetes mellitus, COPD, asthma, stroke, former smoker, eosinophilic esophagitis, colon cancer, PVD, tremor, who presents with chest pain. Initial EKG in the emergency room showed Q wave from V1 to V5, no significant ST elevation.  Troponin was more than 27,000.  Patient was placed on heparin drip.  Heart cath was performed on 2/25, a drug-eluting stent was placed into LAD.   Assessment & Plan:   Principal Problem:   NSTEMI (non-ST elevated myocardial infarction) ([place]) Active Problems:   Hypertension associated with diabetes ([place])   Type 2 diabetes mellitus with proteinuria (HCC)   Hyperlipidemia associated with type 2 diabetes mellitus (HCC)   Gastroesophageal reflux disease without esophagitis   COPD (chronic obstructive pulmonary disease) (HCC)   History of CVA (cerebrovascular accident)   Leukocytosis   Hypercalcemia   Acute on chronic diastolic CHF (congestive heart failure) ([place])  #1.  Acute non-ST elevation myocardial infarction. Acute on chronic diastolic congestive heart failure. Patient had a significant amount of myocardial damage at the time of admission with troponin more than 27,000.  He had a drug-eluting stent placed.  Postoperatively, he developed acute diastolic congestive heart failure.  Condition improving after giving Lasix. Continue dual antiplatelets, statin. Discussed with [name], patient will be able to discharge tomorrow from cardiology standpoint.  2.  COPD. Patient currently stable.  Continue home inhalers.  3.  Hematuria secondary to traumatic Foley catheter. Urology following, hematuria improving after discontinuation of IV Lasix.  4.  Type 2  diabetes. Continue to follow  5.  History of stroke. Continue antiplatelets and statin.  #6. Acute kidney injury. Likely due to diuretics.  Will check another BMP tomorrow.  #7.  Hypokalemia. Hypomagnesemia. Supplemented.   Addendum: 0258. Patient developed hypotension, receiving normal saline bolus.  Blood pressure going up now.  Reviewed stat EKG, patient has anterior myocardial infarction, with ST changes consistent with natural progression post MI.  Patient already has significant myocardial damage. Patient then developed significant altered mental status, become very confused and agitated.  This could be due to hypotension, patient also still has significant hematuria, send UA and urine culture.  Cover with Rocephin for possible UTI. Also obtain had a CT scan.  Discussed with [name], will also obtain neurology consult.    DVT prophylaxis: Heparin SQ Code Status: Full Family Communication: Daughter updated Disposition Plan:  .   Status is: Inpatient  Remains inpatient appropriate because:Inpatient level of care appropriate due to severity of illness   Dispo: The patient is from: Home              Anticipated d/c is to: Home              Patient currently is not medically stable to d/c.   Difficult to place patient No        I/O last 3 completed shifts: In: 1902.7 [P.O.:960; I.V.:792.7; Other:150] Out: 5277 [Urine:4125] No intake/output data recorded.     Consultants:   Cardiology  Procedures: Heart cath  Antimicrobials: None  Subjective: Patient received 2 doses of IV Lasix yesterday, he slept well last night.  He is off oxygen, short of breath at the improved.  He did not have additional chest pain. No fever or chills per No dysuria  hematuria. No abdominal pain or nausea vomiting.  Objective: Vitals:   05/17/20 1949 05/17/20 2310 05/18/20 0414 05/18/20 0802  BP: (!) 148/65 (!) 196/103 101/60 (!) 137/52  Pulse: 67 88 61 (!) 57  Resp: (!) $RemoveB'21   18 18  'ylnMUhzT$ Temp: (!) 97.4 F (36.3 C)  (!) 97.5 F (36.4 C) 98 F (36.7 C)  TempSrc: Oral  Oral Oral  SpO2: 97%  94% 97%  Weight:      Height:        Intake/Output Summary (Last 24 hours) at 05/18/2020 1049 Last data filed at 05/17/2020 2310 Gross per 24 hour  Intake 870 ml  Output 3300 ml  Net -2430 ml   Filed Weights   05/16/20 1728 05/17/20 0511 05/17/20 0718  Weight: 120.5 kg 117.9 kg 117.9 kg    Examination:  General exam: Appears calm and comfortable  Respiratory system: Decreased breathing sounds without crackles or wheezes. Respiratory effort normal. Cardiovascular system: S1 & S2 heard, RRR. No JVD, murmurs, rubs, gallops or clicks. No pedal edema. Gastrointestinal system: Abdomen is nondistended, soft and nontender. No organomegaly or masses felt. Normal bowel sounds heard. Central nervous system: Alert and oriented x2. No focal neurological deficits. Extremities: Symmetric 5 x 5 power. Skin: No rashes, lesions or ulcers Psychiatry:  Mood & affect appropriate.     Data Reviewed: I have personally reviewed following labs and imaging studies  CBC: Recent Labs  Lab 05/16/20 1046 05/17/20 0533 05/18/20 0426  WBC 14.6* 8.8 10.5  NEUTROABS  --   --  7.6  HGB 14.8 13.8 13.7  HCT 42.4 39.5 39.5  MCV 88.3 89.8 89.2  PLT 227 195 540   Basic Metabolic Panel: Recent Labs  Lab 05/16/20 1046 05/17/20 0533 05/18/20 0426  NA 133* 137 137  K 3.7 3.8 3.4*  CL 100 103 99  CO2 $Re'22 29 27  'rpe$ GLUCOSE 170* 150* 188*  BUN 13 16 24*  CREATININE 1.03 0.98 1.49*  CALCIUM 10.7* 10.2 10.4*  MG  --   --  1.6*   GFR: Estimated Creatinine Clearance: 50.9 mL/min (A) (by C-G formula based on SCr of 1.49 mg/dL (H)). Liver Function Tests: No results for input(s): AST, ALT, ALKPHOS, BILITOT, PROT, ALBUMIN in the last 168 hours. No results for input(s): LIPASE, AMYLASE in the last 168 hours. No results for input(s): AMMONIA in the last 168 hours. Coagulation Profile: Recent Labs   Lab 05/16/20 1254  INR 1.1   Cardiac Enzymes: No results for input(s): CKTOTAL, CKMB, CKMBINDEX, TROPONINI in the last 168 hours. BNP (last 3 results) No results for input(s): PROBNP in the last 8760 hours. HbA1C: Recent Labs    05/17/20 0533  HGBA1C 7.9*   CBG: Recent Labs  Lab 05/16/20 2047 05/17/20 0941 05/17/20 1709 05/17/20 2247 05/18/20 0807  GLUCAP 157* 186* 214* 175* 190*   Lipid Profile: Recent Labs    05/17/20 0533  CHOL 78  HDL 31*  LDLCALC 17  TRIG 152*  CHOLHDL 2.5   Thyroid Function Tests: No results for input(s): TSH, T4TOTAL, FREET4, T3FREE, THYROIDAB in the last 72 hours. Anemia Panel: No results for input(s): VITAMINB12, FOLATE, FERRITIN, TIBC, IRON, RETICCTPCT in the last 72 hours. Sepsis Labs: No results for input(s): PROCALCITON, LATICACIDVEN in the last 168 hours.  Recent Results (from the past 240 hour(s))  SARS CORONAVIRUS 2 (TAT 6-24 HRS) Nasopharyngeal Nasopharyngeal Swab     Status: None   Collection Time: 05/16/20 12:54 PM   Specimen: Nasopharyngeal Swab  Result Value  Ref Range Status   SARS Coronavirus 2 NEGATIVE NEGATIVE Final    Comment: (NOTE) SARS-CoV-2 target nucleic acids are NOT DETECTED.  The SARS-CoV-2 RNA is generally detectable in upper and lower respiratory specimens during the acute phase of infection. Negative results do not preclude SARS-CoV-2 infection, do not rule out co-infections with other pathogens, and should not be used as the sole basis for treatment or other patient management decisions. Negative results must be combined with clinical observations, patient history, and epidemiological information. The expected result is Negative.  Fact Sheet for Patients: SugarRoll.be  Fact Sheet for Healthcare Providers: https://www.woods-mathews.com/  This test is not yet approved or cleared by the [place] FDA and  has been authorized for detection and/or diagnosis  of SARS-CoV-2 by FDA under an Emergency Use Authorization (EUA). This EUA will remain  in effect (meaning this test can be used) for the duration of the COVID-19 declaration under Se ction 564(b)(1) of the Act, 21 U.S.C. section 360bbb-3(b)(1), unless the authorization is terminated or revoked sooner.  Performed at [facility], [place] [address], [place], [place] [address]          Radiology Studies: DG Chest 1 View  Result Date: 05/17/2020 CLINICAL DATA:  Shortness of breath. EXAM: CHEST  1 VIEW COMPARISON:  05/16/2020 FINDINGS: Heart size is normal. There is minimal subsegmental atelectasis at the LEFT lung base. No focal consolidations or pleural effusions. No pneumothorax or edema. IMPRESSION: Minimal LEFT lower lobe atelectasis. Electronically Signed   By: Nolon Nations M.D.   On: 05/17/2020 16:10   DG Chest 2 View  Result Date: 05/16/2020 CLINICAL DATA:  Onset chest pain last night. EXAM: CHEST - 2 VIEW COMPARISON:  Single-view of the chest 03/28/2020. FINDINGS: Lungs clear. Heart size normal. No pneumothorax or pleural effusion. Loop recorder noted. No acute or focal bony abnormality. IMPRESSION: No acute disease. Electronically Signed   By: Inge Rise M.D.   On: 05/16/2020 14:05   CARDIAC CATHETERIZATION  Result Date: 05/17/2020  2nd Diag lesion is 100% stenosed.  Mid LAD lesion is 85% stenosed.  A drug-eluting stent was successfully placed using a STENT RESOLUTE ONYX 3.0X34.  Post intervention, there is a 0% residual stenosis.  Prox RCA lesion is 100% stenosed.  1st LPL lesion is 70% stenosed.  LPDA lesion is 50% stenosed.  Ost LM to Prox LAD lesion is 10% stenosed.  Mild to moderately reduced left ventricular function with anterior apical lateral hypokinesis ejection fraction between forty and 45%  Conclusion Successful cardiac cath right radial approach Mild to moderate depressed left ventricular function with anterior apical lateral hypokinesis ejection  fraction around 40 to 45% Coronaries Left main large free of disease moderate calcification LAD large moderate calcification proximally diagonal 100% occluded mid LAD disease 85% Circumflex large left dominant minor irregularities Mild to moderate disease distal left PDA left PL No significant collaterals Intervention Successful PCI and stent to mid LAD with DES 3.0 x 34 mm resolute Onyx Postdilated with a 3.5 x 15 mm [employer] trek to 16 atm Lesion reduced from 85 down to 0% TIMI-3 flow was maintained throughout the procedure No complications        Scheduled Meds: . acyclovir  400 mg Oral BID  . aspirin  81 mg Oral Daily  . aspirin EC  81 mg Oral Daily  . carvedilol  25 mg Oral BID WC  . cholecalciferol  2,000 Units Oral QHS  . clobetasol cream  1 application Topical BID  .  cloNIDine  0.1 mg Oral BID  . clotrimazole   Topical BID  . donepezil  5 mg Oral QHS  . furosemide  20 mg Oral Daily  . insulin aspart  0-5 Units Subcutaneous QHS  . insulin aspart  0-9 Units Subcutaneous TID WC  . insulin glargine  15 Units Subcutaneous QHS  . ipratropium-albuterol  3 mL Nebulization Once  . irbesartan  300 mg Oral Daily  . labetalol  100 mg Oral TID  . mupirocin ointment  1 application Topical BID  . neomycin-polymyxin b-dexamethasone  2 drop Both Eyes BID  . primidone  50 mg Oral BID  . rosuvastatin  40 mg Oral QHS  . sodium chloride flush  3 mL Intravenous Q12H  . sodium chloride flush  3 mL Intravenous Q12H  . tamsulosin  0.4 mg Oral Daily  . ticagrelor  90 mg Oral BID  . tiotropium  18 mcg Inhalation Daily  . vitamin B-12  2,000 mcg Oral QHS   Continuous Infusions: . sodium chloride    . magnesium sulfate bolus IVPB 2 g (05/18/20 0950)     LOS: 2 days    Time spent: 35 minutes, more than 50% time spent on direct patient care.    Sharen Hones, MD [facility]   To contact the attending provider between 7A-7P or the covering provider during after hours 7P-7A, please log into  the web site [email or web address] and access using universal [place] password for that web site. If you do not have the password, please call the hospital operator.  05/18/2020, 10:49 AM

## 2020-05-19 ENCOUNTER — Inpatient Hospital Stay: Payer: Medicare Other

## 2020-05-19 DIAGNOSIS — N179 Acute kidney failure, unspecified: Secondary | ICD-10-CM

## 2020-05-19 LAB — CBC WITH DIFFERENTIAL/PLATELET
Abs Immature Granulocytes: 0.06 10*3/uL (ref 0.00–0.07)
Basophils Absolute: 0.1 10*3/uL (ref 0.0–0.1)
Basophils Relative: 1 %
Eosinophils Absolute: 0.1 10*3/uL (ref 0.0–0.5)
Eosinophils Relative: 1 %
HCT: 35.9 % — ABNORMAL LOW (ref 39.0–52.0)
Hemoglobin: 12.4 g/dL — ABNORMAL LOW (ref 13.0–17.0)
Immature Granulocytes: 1 %
Lymphocytes Relative: 20 %
Lymphs Abs: 1.5 10*3/uL (ref 0.7–4.0)
MCH: 31.3 pg (ref 26.0–34.0)
MCHC: 34.5 g/dL (ref 30.0–36.0)
MCV: 90.7 fL (ref 80.0–100.0)
Monocytes Absolute: 0.9 10*3/uL (ref 0.1–1.0)
Monocytes Relative: 11 %
Neutro Abs: 5.1 10*3/uL (ref 1.7–7.7)
Neutrophils Relative %: 66 %
Platelets: 169 10*3/uL (ref 150–400)
RBC: 3.96 MIL/uL — ABNORMAL LOW (ref 4.22–5.81)
RDW: 13.2 % (ref 11.5–15.5)
WBC: 7.7 10*3/uL (ref 4.0–10.5)
nRBC: 0 % (ref 0.0–0.2)

## 2020-05-19 LAB — URINE CULTURE: Culture: NO GROWTH

## 2020-05-19 LAB — GLUCOSE, CAPILLARY
Glucose-Capillary: 151 mg/dL — ABNORMAL HIGH (ref 70–99)
Glucose-Capillary: 192 mg/dL — ABNORMAL HIGH (ref 70–99)
Glucose-Capillary: 193 mg/dL — ABNORMAL HIGH (ref 70–99)
Glucose-Capillary: 132 mg/dL — ABNORMAL HIGH (ref 70–99)

## 2020-05-19 LAB — BASIC METABOLIC PANEL
Anion gap: 7 (ref 5–15)
BUN: 39 mg/dL — ABNORMAL HIGH (ref 8–23)
CO2: 27 mmol/L (ref 22–32)
Calcium: 9.9 mg/dL (ref 8.9–10.3)
Chloride: 100 mmol/L (ref 98–111)
Creatinine, Ser: 1.65 mg/dL — ABNORMAL HIGH (ref 0.61–1.24)
GFR, Estimated: 42 mL/min — ABNORMAL LOW (ref >60)
Glucose, Bld: 166 mg/dL — ABNORMAL HIGH (ref 70–99)
Potassium: 3.5 mmol/L (ref 3.5–5.1)
Sodium: 134 mmol/L — ABNORMAL LOW (ref 135–145)

## 2020-05-19 LAB — MAGNESIUM: Magnesium: 2.1 mg/dL (ref 1.7–2.4)

## 2020-05-19 MED ORDER — NITROGLYCERIN 0.1 MG/HR TD PT24
0.1000 mg | MEDICATED_PATCH | Freq: Every day | TRANSDERMAL | Status: DC
  Administered 2020-05-19 – 2020-05-20 (×2): 0.1 mg via TRANSDERMAL
  Filled 2020-05-19 (×3): qty 1

## 2020-05-19 NOTE — Progress Notes (Signed)
Patient c/o feeling SOB, oxygen saturation 98%.

## 2020-05-19 NOTE — Progress Notes (Signed)
[facility]    SUBJECTIVE: Patient states to be feeling reasonably well fatigue denies any chest pain no shortness of breath resting comfortably in bed   Vitals:   05/19/20 0751 05/19/20 1156 05/19/20 1506 05/19/20 1622  BP: (!) 118/50 (!) 138/59  (!) 150/63  Pulse: (!) 51 (!) 53 71 62  Resp: 18 18  18   Temp: 98 F (36.7 C) 97.7 F (36.5 C)  98.1 F (36.7 C)  TempSrc: Oral Oral  Oral  SpO2: 98% 100% 95% 99%  Weight:      Height:         Intake/Output Summary (Last 24 hours) at 05/19/2020 1836 Last data filed at 05/19/2020 1830 Gross per 24 hour  Intake 957 ml  Output 710 ml  Net 247 ml      PHYSICAL EXAM  General: Well developed, well nourished, in no acute distress HEENT:  Normocephalic and atramatic Neck:  No JVD.  Lungs: Clear bilaterally to auscultation and percussion. Heart: HRRR . Normal S1 and S2 without gallops or murmurs.  Abdomen: Bowel sounds are positive, abdomen soft and non-tender  Msk:  Back normal, normal gait. Normal strength and tone for age. Extremities: No clubbing, cyanosis or edema.   Neuro: Alert and oriented X 3. Psych:  Good affect, responds appropriately   LABS: Basic Metabolic Panel: Recent Labs    05/18/20 0426 05/19/20 0412  NA 137 134*  K 3.4* 3.5  CL 99 100  CO2 27 27  GLUCOSE 188* 166*  BUN 24* 39*  CREATININE 1.49* 1.65*  CALCIUM 10.4* 9.9  MG 1.6* 2.1   Liver Function Tests: No results for input(s): AST, ALT, ALKPHOS, BILITOT, PROT, ALBUMIN in the last 72 hours. No results for input(s): LIPASE, AMYLASE in the last 72 hours. CBC: Recent Labs    05/18/20 0426 05/19/20 0412  WBC 10.5 7.7  NEUTROABS 7.6 5.1  HGB 13.7 12.4*  HCT 39.5 35.9*  MCV 89.2 90.7  PLT 206 169   Cardiac Enzymes: No results for input(s): CKTOTAL, CKMB, CKMBINDEX, TROPONINI in the last 72 hours. BNP: Invalid input(s): POCBNP D-Dimer: No results for input(s): DDIMER in the last 72 hours. Hemoglobin A1C: Recent Labs    05/17/20 0533   HGBA1C 7.9*   Fasting Lipid Panel: Recent Labs    05/17/20 0533  CHOL 78  HDL 31*  LDLCALC 17  TRIG 152*  CHOLHDL 2.5   Thyroid Function Tests: No results for input(s): TSH, T4TOTAL, T3FREE, THYROIDAB in the last 72 hours.  Invalid input(s): FREET3 Anemia Panel: No results for input(s): VITAMINB12, FOLATE, FERRITIN, TIBC, IRON, RETICCTPCT in the last 72 hours.  CT HEAD WO CONTRAST  Result Date: 05/18/2020 CLINICAL DATA:  Mental status change EXAM: CT HEAD WITHOUT CONTRAST TECHNIQUE: Contiguous axial images were obtained from the base of the skull through the vertex without intravenous contrast. COMPARISON:  March 28, 2020 FINDINGS: Brain: No evidence of acute infarction, hemorrhage, hydrocephalus, extra-axial collection or mass lesion/mass effect. Periventricular white matter hypodensities consistent with sequela of chronic microvascular ischemic disease. Vascular: Vascular calcifications of the carotid siphons and vertebral arteries. Skull: Normal. Negative for fracture or focal lesion. Sinuses/Orbits: Chronic bilateral mastoid effusions. No acute finding. Other: None. IMPRESSION: No acute intracranial abnormality. Electronically Signed   By: Valentino Saxon MD   On: 05/18/2020 14:39     Echo pending  TELEMETRY: Normal sinus rhythm nonspecific ST-T wave changes rate of about 60  ASSESSMENT AND PLAN:  Principal Problem:   NSTEMI (non-ST elevated myocardial infarction) (  [place]) Active Problems:   Hypertension associated with diabetes ([place])   Type 2 diabetes mellitus with proteinuria (HCC)   Hyperlipidemia associated with type 2 diabetes mellitus (HCC)   Gastroesophageal reflux disease without esophagitis   COPD (chronic obstructive pulmonary disease) (HCC)   History of CVA (cerebrovascular accident)   Hypokalemia   Hypomagnesemia   Leukocytosis   Hypercalcemia   Acute on chronic diastolic CHF (congestive heart failure) ([place])   AKI (acute kidney injury) ([place])      Plan Agree with telemetry follow-up troponins EKGs Recommend echocardiogram for assessment of left ventricular function wall motion Status post PCI and stent with DES on aspirin Brilinta Recommend ARB beta-blocker statin Hyperlipidemia recommend statin therapy Lipitor or Crestor COPD continue inhalers supplemental oxygen therapy involved pulmonary Hypertension control with ARB Coreg aspirin Brilinta statin Probable obstructive sleep apnea recommend sleep study CPAP weight loss is necessary Refer patient to cardiac rehab Increase activity recommend ambulate in the halls physical therapy   Yolonda Kida, MD 05/19/2020 6:36 PM

## 2020-05-19 NOTE — Evaluation (Signed)
Occupational Therapy Evaluation Patient Details Name: Justin Griffith MRN: [id number] DOB: 1940/04/20 Today's Date: 05/19/2020    History of Present Illness Justin Griffith is an 63yoM who [facility] on [facility]24 c substernal CP c raditation to Left shoulder, initial troponin >27,000. Pt admitted for NSTEMI. PMH: CAD, NSTEMI, HTN, HLD, DM, COPD, CVA, remote tobacco use, colonCA, PVD, tremor. Pt underwent cardiac cath on 2/25, then developed gross hematuria. Afternoon of 2/26 developed AMS, lethargy, hypotension, seens by neurology, concern for potential hospital delerium.   Clinical Impression   Pt seen for OT evaluation this date. Upon arrival to room, pt seated upright in recliner on 2L O2 via [place]. Prior to admission, pt was independent with ADLs and mod I for functional mobility with RW, and living in a one-story home with wife. Pt endorses falls within the past 6 months. Pt currently requires SET-UP ASSISTANCE/SUPERVISION for seated UB bathing/dressing and seated grooming, with pt requiring increased time and appearing fatigue however vitals WFL (HR 60-75 and SpO2 >93%). Pt required MIN GUARD for stand pivot transfer chair>bed without AD. Pt continues to benefit from skilled OT services to maximize return to PLOF and minimize risk of future falls, injury, caregiver burden, and readmission. Upon discharge, recommend HHOT services and supervision with OOB mobility.    Follow Up Recommendations  Home health OT;Supervision - Intermittent    Equipment Recommendations  3 in 1 bedside commode       Precautions / Restrictions Precautions Precautions: Fall Restrictions Weight Bearing Restrictions: No      Mobility Bed Mobility Overal bed mobility: Modified Independent Bed Mobility: Supine to Sit     Supine to sit: Modified independent (Device/Increase time)          Transfers Overall transfer level: Needs assistance Equipment used: None Transfers: Sit to/from [employer] Sit to Stand: Supervision Stand pivot transfers: Min guard          Balance Overall balance assessment: Modified Independent;Mild deficits observed, not formally tested;History of Falls                                         ADL either performed or assessed with clinical judgement   ADL Overall ADL's : Needs assistance/impaired     Grooming: Oral care;Wash/dry face;Wash/dry hands;Set up;Supervision/safety;Sitting   Upper Body Bathing: Supervision/ safety;Set up;Sitting       Upper Body Dressing : Supervision/safety;Set up;Sitting Upper Body Dressing Details (indicate cue type and reason): To don/doff hospital gown                 Functional mobility during ADLs: Min guard (stand pivot transfer chair>bed) General ADL Comments: SET-UP/SUPERVISION for seated ADLs in chair, with pt requiring increased time and appearing fatigue throughout. Vitals WFL (HR 60-75 and SpO2 >93%)     Vision Baseline Vision/History: Wears glasses Wears Glasses: Reading only              Pertinent Vitals/Pain Pain Assessment: Faces Faces Pain Scale: Hurts a little bit Pain Location: Back Pain Descriptors / Indicators: Aching Pain Intervention(s): Limited activity within patient's tolerance;Repositioned;Monitored during session     Hand Dominance Right   Extremity/Trunk Assessment Upper Extremity Assessment Upper Extremity Assessment: Overall WFL for tasks assessed   Lower Extremity Assessment Lower Extremity Assessment: Overall WFL for tasks assessed       Communication Communication Communication: HOH (has hearing aid, but batteries  are low)   Cognition Arousal/Alertness: Awake/alert Behavior During Therapy: WFL for tasks assessed/performed Overall Cognitive Status: Within Functional Limits for tasks assessed                                                Home Living Family/patient expects to be discharged to:: Private  residence Living Arrangements: Spouse/significant other Available Help at Discharge: Family;Available PRN/intermittently Type of Home: House Home Access: Stairs to enter [employer] of Steps: 1 Entrance Stairs-Rails: None Home Layout: One level     Bathroom Shower/Tub: Walk-in shower         Home Equipment: Environmental consultant - 2 wheels;Grab bars - tub/shower   Additional Comments: wife has alzheimers (no physical assist)      Prior Functioning/Environment Level of Independence: Independent with assistive device(s)        Comments: Pt reports being independent with ADLs and mod I for functional mobility with RW. Pt endorses falls within the past 6 months.        OT Problem List: Decreased strength;Decreased activity tolerance;Cardiopulmonary status limiting activity      OT Treatment/Interventions: Self-care/ADL training;Therapeutic exercise;Energy conservation;DME and/or AE instruction;Therapeutic activities;Patient/family education;Balance training    OT Goals(Current goals can be found in the care plan section) Acute Rehab OT Goals Patient Stated Goal: return to home. OT Goal Formulation: With patient Time For Goal Achievement: 06/02/20 Potential to Achieve Goals: Good ADL Goals Pt Will Perform Grooming: with supervision;standing Pt Will Transfer to Toilet: with modified independence;ambulating;regular height toilet Pt Will Perform Toileting - Clothing Manipulation and hygiene: with modified independence;sit to/from stand  OT Frequency: Min 1X/week    AM-PAC OT "6 Clicks" Daily Activity     Outcome Measure Help from another person eating meals?: None Help from another person taking care of personal grooming?: A Little Help from another person toileting, which includes using toliet, bedpan, or urinal?: A Little Help from another person bathing (including washing, rinsing, drying)?: A Lot Help from another person to put on and taking off regular upper body  clothing?: A Little Help from another person to put on and taking off regular lower body clothing?: A Lot 6 Click Score: 17   End of Session Equipment Utilized During Treatment: Gait belt;Rolling walker;Oxygen Nurse Communication: Mobility status  Activity Tolerance: Patient limited by fatigue Patient left: in bed;with call bell/phone within reach;with bed alarm set;with nursing/sitter in room  OT Visit Diagnosis: Unsteadiness on feet (R26.81);Muscle weakness (generalized) (M62.81)                Time: 1525-1550 OT Time Calculation (min): 25 min Charges:  OT General Charges $OT Visit: 1 Visit OT Treatments $Self Care/Home Management : 8-22 mins  Fredirick Maudlin, OTR/L [place]

## 2020-05-19 NOTE — Progress Notes (Signed)
Spoke with Justin Griffith at [employer] [facility] per daughter's request concerning patient's condition. Patient's daugther [name] added password to the account to enable me to  answer questions.

## 2020-05-19 NOTE — Evaluation (Signed)
Physical Therapy Evaluation Patient Details Name: Justin Griffith MRN: [id number] DOB: 15-May-1940 Today's Date: 05/19/2020   History of Present Illness  Justin Griffith is an 35yoM who comes to [facility] on 2/24 c substernal CP c raditation to Left shoulder, inittial troponin >27,000. Pt admitted for NSTEMI. PMH: CAD, NSTEMI, HTN, HLD, DM, COPD, CVA, remote tobacco use, colonCA, PVD, tremor. Pt underwent cardiac cath on 2/25, then developed gross hematuria. Afternoon of 2/26 developed AMS, lethargy, hypotension, seens by neurology, concern for potential hospital delerium.  Clinical Impression  Pt admitted with above diagnosis. Pt currently with functional limitations due to the deficits listed below (see "PT Problem List"). Upon entry, pt in bed, awake and agreeable to participate, mid lunch. The pt is alert, pleasant, interactive, and able to provide info regarding prior level of function, both in tolerance and independence. Pt in bed O2 donned at 1L, but doffed for remainder of session, sats >95% on room air regardless of activity. No physical assist needed for mobility to EOB, none needed for a labored STS transfer. Pt has baseline unsteadiness without exacerbation this date, no LOB in session, good safety awareness, uses a RW at home at baseline, albeit does have a falls history going back 6 months. Will still need to assess AMB capacity next visit to determine activity tolerance for final DC recommendations. Patient's performance this date reveals decreased ability, independence, and tolerance in performing basic mobility this date, however appears to be very much near baseline. Pt requires additional DME, close physical assistance, and cues for safe participate in mobility. Pt will benefit from skilled PT intervention to increase independence and safety with basic mobility in preparation for discharge to the venue listed below.       Follow Up Recommendations Home health PT;Supervision for mobility/OOB (may  be scheduled for cardiac rehab at DC, plan unknown at this time.)    Equipment Recommendations  None recommended by PT    Recommendations for Other Services       Precautions / Restrictions Precautions Precautions: Fall Restrictions Weight Bearing Restrictions: No      Mobility  Bed Mobility Overal bed mobility: Modified Independent Bed Mobility: Supine to Sit     Supine to sit: Modified independent (Device/Increase time);HOB elevated          Transfers Overall transfer level: Needs assistance Equipment used: None Transfers: Sit to/from Stand Sit to Stand: Supervision         General transfer comment: maintains standing with RW x 60sec, no reported abnormality, tolerated well, no dizziness, on RA, tolerating well, Sats >95%.  Ambulation/Gait Ambulation/Gait assistance:  (deferred; MD asks for limited evaluation this date.)              Stairs            Wheelchair Mobility    Modified Rankin (Stroke Patients Only)       Balance Overall balance assessment: Modified Independent;Mild deficits observed, not formally tested;History of Falls                                           Pertinent Vitals/Pain Pain Assessment: No/denies pain    Home Living Family/patient expects to be discharged to:: Private residence Living Arrangements: Spouse/significant other Available Help at Discharge: Family;Available PRN/intermittently Type of Home: House Home Access: Stairs to enter Entrance Stairs-Rails: None Entrance Stairs-Number of Steps: 1 Home Layout: One level  Home Equipment: [place] - 2 wheels Additional Comments: wife has alzheimers (no physical assist)    Prior Function Level of Independence: Independent with assistive device(s)         Comments: Mod I using rolling walker as needed. Falls reported in the past 6 months     Hand Dominance   Dominant Hand: Right    Extremity/Trunk Assessment   Upper Extremity  Assessment Upper Extremity Assessment: Overall WFL for tasks assessed    Lower Extremity Assessment Lower Extremity Assessment: Overall WFL for tasks assessed       Communication   Communication: HOH  Cognition Arousal/Alertness: Awake/alert Behavior During Therapy: WFL for tasks assessed/performed Overall Cognitive Status: Within Functional Limits for tasks assessed                                        General Comments      Exercises     Assessment/Plan    PT Assessment Patient needs continued PT services  PT Problem List Decreased activity tolerance;Decreased strength;Decreased mobility;Decreased balance;Cardiopulmonary status limiting activity       PT Treatment Interventions DME instruction;Gait training;Functional mobility training;Stair training;Therapeutic activities;Therapeutic exercise;Patient/family education    PT Goals (Current goals can be found in the Care Plan section)  Acute Rehab PT Goals Patient Stated Goal: return to home. PT Goal Formulation: With patient Time For Goal Achievement: 06/02/20 Potential to Achieve Goals: Good    Frequency Min 2X/week   Barriers to discharge Decreased caregiver support pt reports wife can assist at home; per chart review, wife has alheizmer's dementia and may not be appropriate for providing physical assistance    Co-evaluation               AM-PAC PT "6 Clicks" Mobility  Outcome Measure Help needed turning from your back to your side while in a flat bed without using bedrails?: A Little Help needed moving from lying on your back to sitting on the side of a flat bed without using bedrails?: A Little Help needed moving to and from a bed to a chair (including a wheelchair)?: A Little Help needed standing up from a chair using your arms (e.g., wheelchair or bedside chair)?: A Little Help needed to walk in hospital room?: A Little Help needed climbing 3-5 steps with a railing? : A Little 6  Click Score: 18    End of Session Equipment Utilized During Treatment: Oxygen Activity Tolerance: Patient tolerated treatment well;No increased pain Patient left: in chair;with call bell/phone within reach (meal tray presented) Nurse Communication: Mobility status PT Visit Diagnosis: Other abnormalities of gait and mobility (R26.89);Difficulty in walking, not elsewhere classified (R26.2)    Time: 1437-1500 PT Time Calculation (min) (ACUTE ONLY): 23 min   Charges:   PT Evaluation $PT Eval High Complexity: 1 High          3:14 PM, 05/19/20 Etta Grandchild, PT, DPT Physical Therapist - [name]  [phone] ([place])    Henning C 05/19/2020, 3:11 PM

## 2020-05-19 NOTE — Progress Notes (Signed)
PROGRESS NOTE    Justin Griffith  CWC:[id number] DOB: [birth year] DOA: 05/16/2020 PCP: Charlynne Cousins, MD   Chief Complaint.  Shortness of breath. Brief Narrative:  Justin Griffith a 80 y.o.malewith medical history significant ofCAD, non-STEMI, hypertension, hyperlipidemia, diabetes mellitus, COPD, asthma, stroke, former smoker, eosinophilic esophagitis, colon cancer, PVD, tremor, who presents with chest pain. Initial EKG in the emergency room showed Q wave from V1 to V5, no significant ST elevation. Troponin was more than27,000. Patient was placed on heparin drip. Heart cath was performed on 2/25, a drug-eluting stent was placed into LAD.  Postoperatively, patient developed secondary short of breath, with giving IV Lasix for congestive heart failure.     Assessment & Plan:   Principal Problem:   NSTEMI (non-ST elevated myocardial infarction) ([place]) Active Problems:   Hypertension associated with diabetes ([place])   Type 2 diabetes mellitus with proteinuria (HCC)   Hyperlipidemia associated with type 2 diabetes mellitus (HCC)   Gastroesophageal reflux disease without esophagitis   COPD (chronic obstructive pulmonary disease) (HCC)   History of CVA (cerebrovascular accident)   Hypokalemia   Hypomagnesemia   Leukocytosis   Hypercalcemia   Acute on chronic diastolic CHF (congestive heart failure) ([place])   AKI (acute kidney injury) ([place])  #1. Acute non-ST elevation myocardial infarction. Acute on chronic diastolic congestive heart failure. Patient is s/p LAD drug-eluting stent.  Continue dual antiplatelet treatment. Patient had a large elevation of troponin prior to surgery, most likely has significant damage to the left sequential function.  Patient has persistently short of breath even his oxygenation is better.  Patient most likely has worsening LV dysfunction due to large infarct.  Will repeat echocardiogram to evaluate. I also add nitroglycerin patch to relieve any residual  ischemia. Patient is still quite sick at this time, will follow closely. Due to large infarct, patient had high risk of severe left ventricular dysfunction, high risk of mortality.  #2.  COPD. Stable.  3.  Hematuria secondary to traumatic Foley catheter. Acute metabolic encephalopathy. Transient hypotension. Patient developed hypotension yesterday, improved after fluids.  Patient also had altered mental status.  Due to significant nocturia, cannot rule out UTI.  Urine culture sent out, Rocephin started for possible UTI.  Follow-up on urine culture results.  4.  Type 2 diabetes per Continue follow-up  5.  History of stroke. Continue antiplatelets and statin.  6.  Acute kidney injury. Hypomagnesemia Hypokalemia. Renal function still not recovered, will continue to follow. Potassium magnesium level normalized.     DVT prophylaxis: Heparin SQ Code Status: Full Family Communication:  Disposition Plan:  .   Status is: Inpatient  Remains inpatient appropriate because:Inpatient level of care appropriate due to severity of illness   Dispo: The patient is from: Home              Anticipated d/c is to: Home              Patient currently is not medically stable to d/c.   Difficult to place patient No        I/O last 3 completed shifts: In: 3207.6 [P.O.:1197; I.V.:500; Other:390; IV Piggyback:1120.6] Out: 28 [Urine:1610] Total I/O In: 240 [P.O.:240] Out: -      Consultants:   cardiology  Procedures: heart cath  Antimicrobials: Rocephin  Subjective: Patient still do not feel well, states that he is short of breath, he is only on 2 L oxygen with good saturation.  No chest pain. No nausea vomiting abdominal pain. No dysuria hematuria.  No fever or chills.  Objective: Vitals:   05/19/20 0053 05/19/20 0418 05/19/20 0751 05/19/20 1156  BP: (!) 115/51 (!) 126/56 (!) 118/50 (!) 138/59  Pulse: (!) 54 (!) 51 (!) 51 (!) 53  Resp: 16 17 18 18   Temp: 98.1 F (36.7  C) 97.7 F (36.5 C) 98 F (36.7 C) 97.7 F (36.5 C)  TempSrc: Oral Oral Oral Oral  SpO2: 94% 93% 98% 100%  Weight:  117.7 kg    Height:        Intake/Output Summary (Last 24 hours) at 05/19/2020 1236 Last data filed at 05/19/2020 0950 Gross per 24 hour  Intake 2577.62 ml  Output 710 ml  Net 1867.62 ml   Filed Weights   05/17/20 0511 05/17/20 0718 05/19/20 0418  Weight: 117.9 kg 117.9 kg 117.7 kg    Examination:  General exam: Appears calm and comfortable  Respiratory system: Clear to auscultation. Respiratory effort normal. Cardiovascular system: S1 & S2 heard, RRR. No JVD, murmurs, rubs, gallops or clicks. No pedal edema. Gastrointestinal system: Abdomen is nondistended, soft and nontender. No organomegaly or masses felt. Normal bowel sounds heard. Central nervous system: Alert and oriented x3. No focal neurological deficits. Extremities: Symmetric 5 x 5 power. Skin: No rashes, lesions or ulcers Psychiatry: Mood & affect appropriate.     Data Reviewed: I have personally reviewed following labs and imaging studies  CBC: Recent Labs  Lab 05/16/20 1046 05/17/20 0533 05/18/20 0426 05/19/20 0412  WBC 14.6* 8.8 10.5 7.7  NEUTROABS  --   --  7.6 5.1  HGB 14.8 13.8 13.7 12.4*  HCT 42.4 39.5 39.5 35.9*  MCV 88.3 89.8 89.2 90.7  PLT 227 195 206 027   Basic Metabolic Panel: Recent Labs  Lab 05/16/20 1046 05/17/20 0533 05/18/20 0426 05/19/20 0412  NA 133* 137 137 134*  K 3.7 3.8 3.4* 3.5  CL 100 103 99 100  CO2 22 29 27 27   GLUCOSE 170* 150* 188* 166*  BUN 13 16 24* 39*  CREATININE 1.03 0.98 1.49* 1.65*  CALCIUM 10.7* 10.2 10.4* 9.9  MG  --   --  1.6* 2.1   GFR: Estimated Creatinine Clearance: 45.9 mL/min (A) (by C-G formula based on SCr of 1.65 mg/dL (H)). Liver Function Tests: No results for input(s): AST, ALT, ALKPHOS, BILITOT, PROT, ALBUMIN in the last 168 hours. No results for input(s): LIPASE, AMYLASE in the last 168 hours. No results for input(s):  AMMONIA in the last 168 hours. Coagulation Profile: Recent Labs  Lab 05/16/20 1254  INR 1.1   Cardiac Enzymes: No results for input(s): CKTOTAL, CKMB, CKMBINDEX, TROPONINI in the last 168 hours. BNP (last 3 results) No results for input(s): PROBNP in the last 8760 hours. HbA1C: Recent Labs    05/17/20 0533  HGBA1C 7.9*   CBG: Recent Labs  Lab 05/18/20 1318 05/18/20 1632 05/18/20 1955 05/19/20 0751 05/19/20 1156  GLUCAP 207* 139* 164* 151* 192*   Lipid Profile: Recent Labs    05/17/20 0533  CHOL 78  HDL 31*  LDLCALC 17  TRIG 152*  CHOLHDL 2.5   Thyroid Function Tests: No results for input(s): TSH, T4TOTAL, FREET4, T3FREE, THYROIDAB in the last 72 hours. Anemia Panel: No results for input(s): VITAMINB12, FOLATE, FERRITIN, TIBC, IRON, RETICCTPCT in the last 72 hours. Sepsis Labs: No results for input(s): PROCALCITON, LATICACIDVEN in the last 168 hours.  Recent Results (from the past 240 hour(s))  SARS CORONAVIRUS 2 (TAT 6-24 HRS) Nasopharyngeal Nasopharyngeal Swab     Status:  None   Collection Time: 05/16/20 12:54 PM   Specimen: Nasopharyngeal Swab  Result Value Ref Range Status   SARS Coronavirus 2 NEGATIVE NEGATIVE Final    Comment: (NOTE) SARS-CoV-2 target nucleic acids are NOT DETECTED.  The SARS-CoV-2 RNA is generally detectable in upper and lower respiratory specimens during the acute phase of infection. Negative results do not preclude SARS-CoV-2 infection, do not rule out co-infections with other pathogens, and should not be used as the sole basis for treatment or other patient management decisions. Negative results must be combined with clinical observations, patient history, and epidemiological information. The expected result is Negative.  Fact Sheet for Patients: SugarRoll.be  Fact Sheet for Healthcare Providers: https://www.woods-mathews.com/  This test is not yet approved or cleared by the [place] FDA and  has been authorized for detection and/or diagnosis of SARS-CoV-2 by FDA under an Emergency Use Authorization (EUA). This EUA will remain  in effect (meaning this test can be used) for the duration of the COVID-19 declaration under Se ction 564(b)(1) of the Act, 21 U.S.C. section 360bbb-3(b)(1), unless the authorization is terminated or revoked sooner.  Performed at [facility], [place] [address], [place], [place] [address]   Urine Culture     Status: None   Collection Time: 05/18/20  1:48 PM   Specimen: Urine, Random  Result Value Ref Range Status   Specimen Description   Final    URINE, RANDOM Performed at [name], [address], [place], [place] [address]    Special Requests   Final    NONE Performed at [facility], [address] [place], [place] [address]    Culture   Final    NO GROWTH Performed at [facility], [place] [address] [place], [place] [address]    Report Status 05/19/2020 FINAL  Final         Radiology Studies: DG Chest 1 View  Result Date: 05/17/2020 CLINICAL DATA:  Shortness of breath. EXAM: CHEST  1 VIEW COMPARISON:  05/16/2020 FINDINGS: Heart size is normal. There is minimal subsegmental atelectasis at the LEFT lung base. No focal consolidations or pleural effusions. No pneumothorax or edema. IMPRESSION: Minimal LEFT lower lobe atelectasis. Electronically Signed   By: Nolon Nations M.D.   On: 05/17/2020 16:10   CT HEAD WO CONTRAST  Result Date: 05/18/2020 CLINICAL DATA:  Mental status change EXAM: CT HEAD WITHOUT CONTRAST TECHNIQUE: Contiguous axial images were obtained from the base of the skull through the vertex without intravenous contrast. COMPARISON:  March 28, 2020 FINDINGS: Brain: No evidence of acute infarction, hemorrhage, hydrocephalus, extra-axial collection or mass lesion/mass effect. Periventricular white matter hypodensities consistent with sequela of chronic microvascular ischemic  disease. Vascular: Vascular calcifications of the carotid siphons and vertebral arteries. Skull: Normal. Negative for fracture or focal lesion. Sinuses/Orbits: Chronic bilateral mastoid effusions. No acute finding. Other: None. IMPRESSION: No acute intracranial abnormality. Electronically Signed   By: Valentino Saxon MD   On: 05/18/2020 14:39        Scheduled Meds: . acyclovir  400 mg Oral BID  . aspirin EC  81 mg Oral Daily  . Chlorhexidine Gluconate Cloth  6 each Topical Daily  . cholecalciferol  2,000 Units Oral QHS  . clobetasol cream  1 application Topical BID  . clotrimazole   Topical BID  . donepezil  5 mg Oral QHS  . heparin injection (subcutaneous)  5,000 Units Subcutaneous Q8H  . insulin aspart  0-5 Units Subcutaneous QHS  .  insulin aspart  0-9 Units Subcutaneous TID WC  . insulin glargine  15 Units Subcutaneous QHS  . ipratropium-albuterol  3 mL Nebulization Once  . mupirocin ointment  1 application Topical BID  . neomycin-polymyxin b-dexamethasone  2 drop Both Eyes BID  . nitroGLYCERIN  0.1 mg Transdermal Daily  . primidone  50 mg Oral BID  . rosuvastatin  40 mg Oral QHS  . sodium chloride flush  3 mL Intravenous Q12H  . sodium chloride flush  3 mL Intravenous Q12H  . tamsulosin  0.4 mg Oral Daily  . ticagrelor  90 mg Oral BID  . tiotropium  18 mcg Inhalation Daily  . vitamin B-12  2,000 mcg Oral QHS   Continuous Infusions: . sodium chloride    . cefTRIAXone (ROCEPHIN)  IV 2 g (05/18/20 1548)     LOS: 3 days    Time spent: 34 minutes    Sharen Hones, MD [facility]   To contact the attending provider between 7A-7P or the covering provider during after hours 7P-7A, please log into the web site [email or web address] and access using universal [place] password for that web site. If you do not have the password, please call the hospital operator.  05/19/2020, 12:36 PM

## 2020-05-19 NOTE — Progress Notes (Addendum)
Pt going to MRI.  Update 0040: Pt got back from MRI. Will contineu to monitor.

## 2020-05-20 ENCOUNTER — Inpatient Hospital Stay: Payer: Medicare Other

## 2020-05-20 ENCOUNTER — Inpatient Hospital Stay
Admit: 2020-05-20 | Discharge: 2020-05-20 | Disposition: A | Discharge status: Home / Self Care | Payer: Medicare Other | Attending: Internal Medicine | Admitting: Internal Medicine

## 2020-05-20 DIAGNOSIS — I639 Cerebral infarction, unspecified: Secondary | ICD-10-CM

## 2020-05-20 DIAGNOSIS — Z8673 Personal history of transient ischemic attack (TIA), and cerebral infarction without residual deficits: Secondary | ICD-10-CM

## 2020-05-20 DIAGNOSIS — I63413 Cerebral infarction due to embolism of bilateral middle cerebral arteries: Secondary | ICD-10-CM

## 2020-05-20 LAB — BASIC METABOLIC PANEL
Anion gap: 7 (ref 5–15)
BUN: 36 mg/dL — ABNORMAL HIGH (ref 8–23)
CO2: 27 mmol/L (ref 22–32)
Calcium: 9.8 mg/dL (ref 8.9–10.3)
Chloride: 99 mmol/L (ref 98–111)
Creatinine, Ser: 1.27 mg/dL — ABNORMAL HIGH (ref 0.61–1.24)
GFR, Estimated: 57 mL/min — ABNORMAL LOW (ref >60)
Glucose, Bld: 173 mg/dL — ABNORMAL HIGH (ref 70–99)
Potassium: 3.8 mmol/L (ref 3.5–5.1)
Sodium: 133 mmol/L — ABNORMAL LOW (ref 135–145)

## 2020-05-20 LAB — ECHOCARDIOGRAM COMPLETE
AR max vel: 2.66 cm2
AV Area VTI: 2.91 cm2
AV Area mean vel: 2.79 cm2
AV Mean grad: 4 mmHg
AV Peak grad: 7.7 mmHg
Ao pk vel: 1.38 m/s
Area-P 1/2: 2.99 cm2
Height: 70 in
S' Lateral: 2.87 cm
Weight: 4134.4 oz

## 2020-05-20 LAB — GLUCOSE, CAPILLARY
Glucose-Capillary: 158 mg/dL — ABNORMAL HIGH (ref 70–99)
Glucose-Capillary: 189 mg/dL — ABNORMAL HIGH (ref 70–99)
Glucose-Capillary: 133 mg/dL — ABNORMAL HIGH (ref 70–99)
Glucose-Capillary: 117 mg/dL — ABNORMAL HIGH (ref 70–99)

## 2020-05-20 LAB — HEMOGLOBIN A1C
Hgb A1c MFr Bld: 7.8 % — ABNORMAL HIGH (ref 4.8–5.6)
Mean Plasma Glucose: 177.16 mg/dL

## 2020-05-20 LAB — BRAIN NATRIURETIC PEPTIDE: B Natriuretic Peptide: 85.4 pg/mL (ref 0.0–100.0)

## 2020-05-20 LAB — CBC WITH DIFFERENTIAL/PLATELET
Abs Immature Granulocytes: 0.05 10*3/uL (ref 0.00–0.07)
Basophils Absolute: 0.1 10*3/uL (ref 0.0–0.1)
Basophils Relative: 1 %
Eosinophils Absolute: 0.2 10*3/uL (ref 0.0–0.5)
Eosinophils Relative: 2 %
HCT: 35.7 % — ABNORMAL LOW (ref 39.0–52.0)
Hemoglobin: 12.1 g/dL — ABNORMAL LOW (ref 13.0–17.0)
Immature Granulocytes: 1 %
Lymphocytes Relative: 22 %
Lymphs Abs: 1.7 10*3/uL (ref 0.7–4.0)
MCH: 30.9 pg (ref 26.0–34.0)
MCHC: 33.9 g/dL (ref 30.0–36.0)
MCV: 91.1 fL (ref 80.0–100.0)
Monocytes Absolute: 0.8 10*3/uL (ref 0.1–1.0)
Monocytes Relative: 10 %
Neutro Abs: 5.2 10*3/uL (ref 1.7–7.7)
Neutrophils Relative %: 64 %
Platelets: 183 10*3/uL (ref 150–400)
RBC: 3.92 MIL/uL — ABNORMAL LOW (ref 4.22–5.81)
RDW: 13.2 % (ref 11.5–15.5)
WBC: 8 10*3/uL (ref 4.0–10.5)
nRBC: 0 % (ref 0.0–0.2)

## 2020-05-20 LAB — LIPID PANEL
Cholesterol: 79 mg/dL (ref 0–200)
HDL: 29 mg/dL — ABNORMAL LOW (ref >40)
LDL Cholesterol: 19 mg/dL (ref 0–99)
Total CHOL/HDL Ratio: 2.7 RATIO
Triglycerides: 154 mg/dL — ABNORMAL HIGH (ref <150)
VLDL: 31 mg/dL (ref 0–40)

## 2020-05-20 LAB — MAGNESIUM: Magnesium: 2 mg/dL (ref 1.7–2.4)

## 2020-05-20 MED ORDER — THIAMINE HCL 100 MG PO TABS
100.0000 mg | ORAL_TABLET | Freq: Every day | ORAL | Status: DC
  Administered 2020-05-21: 100 mg via ORAL
  Filled 2020-05-20 (×2): qty 1

## 2020-05-20 MED ORDER — ACETAMINOPHEN 325 MG PO TABS: 650.0000 mg | ORAL_TABLET | ORAL | Status: DC | PRN

## 2020-05-20 MED ORDER — THIAMINE HCL 100 MG/ML IJ SOLN: 100.0000 mg | Freq: Every day | INTRAMUSCULAR | Status: DC

## 2020-05-20 NOTE — Progress Notes (Signed)
PROGRESS NOTE    Justin Griffith  EXH:[id number] DOB: 02-12-1941 DOA: 05/16/2020 PCP: Charlynne Cousins, MD   Chief complaint.  Shortness of breath. Brief Narrative:  Justin Griffith a 80 y.o.malewith medical history significant ofCAD, non-STEMI, hypertension, hyperlipidemia, diabetes mellitus, COPD, asthma, stroke, former smoker, eosinophilic esophagitis, colon cancer, PVD, tremor, who presents with chest pain. Initial EKG in the emergency room showed Q wave from V1 to V5, no significant ST elevation. Troponin was more than27,000. Patient was placed on heparin drip. Heart cath was performed on 2/25, a drug-eluting stent was placed into LAD.  Postoperatively, patient developed secondary short of breath, with giving IV Lasix for congestive heart failure.   Developed hypotension on 2/16, received fluid bolus.  Also started antibiotic for possible UTI with altered mental status.  Urine culture came back negative 2/28, Rocephin discontinued.   Assessment & Plan:   Principal Problem:   NSTEMI (non-ST elevated myocardial infarction) ([place]) Active Problems:   Hypertension associated with diabetes ([place])   Type 2 diabetes mellitus with proteinuria (HCC)   Hyperlipidemia associated with type 2 diabetes mellitus (HCC)   Gastroesophageal reflux disease without esophagitis   COPD (chronic obstructive pulmonary disease) (HCC)   History of CVA (cerebrovascular accident)   Hypokalemia   Hypomagnesemia   Leukocytosis   Hypercalcemia   Acute on chronic diastolic CHF (congestive heart failure) ([place])   AKI (acute kidney injury) ([place])  #1. Acute non-ST elevation myocardial infarction. Acute on chronic diastolic congestive heart failure. Patient is s/p LAD drug-eluting stent.  Continue dual antiplatelet treatment. Patient has significant short of breath, was started on nitroglycerin patch.  Also pending echocardiogram to evaluate LV dysfunction, patient appeared to have a large infarct prior to the  heart cath.  Anticipating worsening LV dysfunction. Patient short of breath much better after the the nitroglycerin.  BNP normalized, no longer has any volume overload. Patient probably can be discharged home tomorrow.  2.  Acute embolic stroke. Acute metabolic encephalopathy. Transient hypotension. Brain MRI came back with multiple micro infarcts.  Consistent with embolic stroke. Patient does not appear to have UTI, urine culture came back negative.  Antibiotic discontinued.  Patient might had an embolic event with sudden drop of blood pressure as well as mental status changes. Patient is a followed by neurology, already on dual antiplatelet treatment.    #3.  Hematuria secondary to traumatic Foley catheter. Condition improved, discussed with Dr. Bernardo Heater, catheter can be removed today.  4.  Type 2 diabetes. Continue to follow-up  5.  Acute kidney injury. Hypomagnesemia Hypokalemia Condition has improved.  Renal function close to normal.    DVT prophylaxis: Heparin SQ Code Status: Full Family Communication: daughter updated Disposition Plan:  .   Status is: Inpatient  Remains inpatient appropriate because:Inpatient level of care appropriate due to severity of illness   Dispo: The patient is from: Home              Anticipated d/c is to: Home              Patient currently is not medically stable to d/c.   Difficult to place patient No        I/O last 3 completed shifts: In: 3810 [P.O.:1077; Other:240] Out: 1260 [Urine:1260] No intake/output data recorded.     Consultants:   Cardiology, neurology  Procedures: heart cath  Antimicrobials: none  Subjective: Patient condition improved significantly today.  No longer has any chest pain or shortness of breath. He does not have any  confusion or headaches. No fever chills. No additional hematuria, Foley catheter is removed.   Objective: Vitals:   05/20/20 0038 05/20/20 0523 05/20/20 0738 05/20/20 0744  BP:  (!) 147/70 (!) 112/56 124/81 (!) 154/82  Pulse: 60 (!) 53 (!) 111 (!) 57  Resp: 16 18 (!) 23 (!) 21  Temp: (!) 97.5 F (36.4 C) 97.8 F (36.6 C) 99.7 F (37.6 C) 98.5 F (36.9 C)  TempSrc: Oral Oral Oral Oral  SpO2: 99% 95% 96% 99%  Weight:  117.2 kg    Height:        Intake/Output Summary (Last 24 hours) at 05/20/2020 0959 Last data filed at 05/20/2020 0836 Gross per 24 hour  Intake 600 ml  Output 950 ml  Net -350 ml   Filed Weights   05/17/20 0718 05/19/20 0418 05/20/20 0523  Weight: 117.9 kg 117.7 kg 117.2 kg    Examination:  General exam: Appears calm and comfortable  Respiratory system: Clear to auscultation. Respiratory effort normal. Cardiovascular system: S1 & S2 heard, RRR. No JVD, murmurs, rubs, gallops or clicks. No pedal edema. Gastrointestinal system: Abdomen is nondistended, soft and nontender. No organomegaly or masses felt. Normal bowel sounds heard. Central nervous system: Alert and oriented x3. No focal neurological deficits. Extremities: Symmetric 5 x 5 power. Skin: No rashes, lesions or ulcers Psychiatry: Mood & affect appropriate.     Data Reviewed: I have personally reviewed following labs and imaging studies  CBC: Recent Labs  Lab 05/16/20 1046 05/17/20 0533 05/18/20 0426 05/19/20 0412 05/20/20 0419  WBC 14.6* 8.8 10.5 7.7 8.0  NEUTROABS  --   --  7.6 5.1 5.2  HGB 14.8 13.8 13.7 12.4* 12.1*  HCT 42.4 39.5 39.5 35.9* 35.7*  MCV 88.3 89.8 89.2 90.7 91.1  PLT 227 195 206 169 694   Basic Metabolic Panel: Recent Labs  Lab 05/16/20 1046 05/17/20 0533 05/18/20 0426 05/19/20 0412 05/20/20 0419  NA 133* 137 137 134* 133*  K 3.7 3.8 3.4* 3.5 3.8  CL 100 103 99 100 99  CO2 22 29 27 27 27   GLUCOSE 170* 150* 188* 166* 173*  BUN 13 16 24* 39* 36*  CREATININE 1.03 0.98 1.49* 1.65* 1.27*  CALCIUM 10.7* 10.2 10.4* 9.9 9.8  MG  --   --  1.6* 2.1 2.0   GFR: Estimated Creatinine Clearance: 59.5 mL/min (A) (by C-G formula based on SCr of 1.27  mg/dL (H)). Liver Function Tests: No results for input(s): AST, ALT, ALKPHOS, BILITOT, PROT, ALBUMIN in the last 168 hours. No results for input(s): LIPASE, AMYLASE in the last 168 hours. No results for input(s): AMMONIA in the last 168 hours. Coagulation Profile: Recent Labs  Lab 05/16/20 1254  INR 1.1   Cardiac Enzymes: No results for input(s): CKTOTAL, CKMB, CKMBINDEX, TROPONINI in the last 168 hours. BNP (last 3 results) No results for input(s): PROBNP in the last 8760 hours. HbA1C: No results for input(s): HGBA1C in the last 72 hours. CBG: Recent Labs  Lab 05/19/20 0751 05/19/20 1156 05/19/20 1623 05/19/20 2029 05/20/20 0839  GLUCAP 151* 192* 193* 132* 158*   Lipid Profile: No results for input(s): CHOL, HDL, LDLCALC, TRIG, CHOLHDL, LDLDIRECT in the last 72 hours. Thyroid Function Tests: No results for input(s): TSH, T4TOTAL, FREET4, T3FREE, THYROIDAB in the last 72 hours. Anemia Panel: No results for input(s): VITAMINB12, FOLATE, FERRITIN, TIBC, IRON, RETICCTPCT in the last 72 hours. Sepsis Labs: No results for input(s): PROCALCITON, LATICACIDVEN in the last 168 hours.  Recent Results (from  the past 240 hour(s))  SARS CORONAVIRUS 2 (TAT 6-24 HRS) Nasopharyngeal Nasopharyngeal Swab     Status: None   Collection Time: 05/16/20 12:54 PM   Specimen: Nasopharyngeal Swab  Result Value Ref Range Status   SARS Coronavirus 2 NEGATIVE NEGATIVE Final    Comment: (NOTE) SARS-CoV-2 target nucleic acids are NOT DETECTED.  The SARS-CoV-2 RNA is generally detectable in upper and lower respiratory specimens during the acute phase of infection. Negative results do not preclude SARS-CoV-2 infection, do not rule out co-infections with other pathogens, and should not be used as the sole basis for treatment or other patient management decisions. Negative results must be combined with clinical observations, patient history, and epidemiological information. The expected result is  Negative.  Fact Sheet for Patients: SugarRoll.be  Fact Sheet for Healthcare Providers: https://www.woods-mathews.com/  This test is not yet approved or cleared by the [place] FDA and  has been authorized for detection and/or diagnosis of SARS-CoV-2 by FDA under an Emergency Use Authorization (EUA). This EUA will remain  in effect (meaning this test can be used) for the duration of the COVID-19 declaration under Se ction 564(b)(1) of the Act, 21 U.S.C. section 360bbb-3(b)(1), unless the authorization is terminated or revoked sooner.  Performed at [facility], [place] [address] [place], [place] [address]   Urine Culture     Status: None   Collection Time: 05/18/20  1:48 PM   Specimen: Urine, Random  Result Value Ref Range Status   Specimen Description   Final    URINE, RANDOM Performed at [facility], [address], [place], [place] [address]    Special Requests   Final    NONE Performed at [facility], [address], [place], [place] [address]    Culture   Final    NO GROWTH Performed at [facility], [place] [address], [name], [place] [address]    Report Status 05/19/2020 FINAL  Final         Radiology Studies: CT HEAD WO CONTRAST  Result Date: 05/18/2020 CLINICAL DATA:  Mental status change EXAM: CT HEAD WITHOUT CONTRAST TECHNIQUE: Contiguous axial images were obtained from the base of the skull through the vertex without intravenous contrast. COMPARISON:  March 28, 2020 FINDINGS: Brain: No evidence of acute infarction, hemorrhage, hydrocephalus, extra-axial collection or mass lesion/mass effect. Periventricular white matter hypodensities consistent with sequela of chronic microvascular ischemic disease. Vascular: Vascular calcifications of the carotid siphons and vertebral arteries. Skull: Normal. Negative for fracture or focal lesion. Sinuses/Orbits: Chronic bilateral mastoid effusions. No  acute finding. Other: None. IMPRESSION: No acute intracranial abnormality. Electronically Signed   By: Valentino Saxon MD   On: 05/18/2020 14:39   MR BRAIN WO CONTRAST  Result Date: 05/19/2020 CLINICAL DATA:  Hypotensive episode yesterday. Altered mental status with confusion and agitation. EXAM: MRI HEAD WITHOUT CONTRAST TECHNIQUE: Multiplanar, multiecho pulse sequences of the brain and surrounding structures were obtained without intravenous contrast. COMPARISON:  Head CT yesterday.  Brain MRI 04/21/2019. FINDINGS: Brain: Diffusion imaging shows a punctate acute infarction at the inferior posterior temporal lobe on the right. Subcentimeter white matter infarction is seen in the right frontal white matter. Punctate acute infarction at the right posterior parietal cortex and right frontal vertex. Left hemisphere shows 5 or 6 punctate infarctions scattered within the occipital and parietal regions. The pattern is most consistent with micro embolic infarctions. The pattern is not typical of watershed insult. Elsewhere, chronic small-vessel ischemic changes affect the pons.  There are old or late subacute infarctions at the inferior cerebellum on the left. Cerebral hemispheres otherwise show moderate chronic small-vessel ischemic changes affecting the deep and subcortical white matter. Minor old small vessel insults within the thalami and basal ganglia. Vascular: Major vessels at the base of the brain show flow. Skull and upper cervical spine: Negative Sinuses/Orbits: Sinuses are clear. Small amount of fluid in the mastoid air cells. Other: None IMPRESSION: Acute micro embolic infarctions within both cerebral hemispheres scattered about. No large confluent infarction. The pattern is not typical of watershed insult. Older studies also showed small punctate acute infarctions. This raises the possibility of embolic disease from the heart or ascending aorta. Late subacute to old infarctions now seen at the inferior  cerebellum on the left. Electronically Signed   By: Nelson Chimes M.D.   On: 05/19/2020 23:52        Scheduled Meds: . acyclovir  400 mg Oral BID  . aspirin EC  81 mg Oral Daily  . Chlorhexidine Gluconate Cloth  6 each Topical Daily  . cholecalciferol  2,000 Units Oral QHS  . clobetasol cream  1 application Topical BID  . clotrimazole   Topical BID  . donepezil  5 mg Oral QHS  . heparin injection (subcutaneous)  5,000 Units Subcutaneous Q8H  . insulin aspart  0-5 Units Subcutaneous QHS  . insulin aspart  0-9 Units Subcutaneous TID WC  . insulin glargine  15 Units Subcutaneous QHS  . ipratropium-albuterol  3 mL Nebulization Once  . mupirocin ointment  1 application Topical BID  . neomycin-polymyxin b-dexamethasone  2 drop Both Eyes BID  . nitroGLYCERIN  0.1 mg Transdermal Daily  . primidone  50 mg Oral BID  . rosuvastatin  40 mg Oral QHS  . sodium chloride flush  3 mL Intravenous Q12H  . sodium chloride flush  3 mL Intravenous Q12H  . tamsulosin  0.4 mg Oral Daily  . thiamine injection  100 mg Intravenous Daily   Or  . thiamine  100 mg Oral Daily  . ticagrelor  90 mg Oral BID  . tiotropium  18 mcg Inhalation Daily  . vitamin B-12  2,000 mcg Oral QHS   Continuous Infusions: . sodium chloride    . cefTRIAXone (ROCEPHIN)  IV 2 g (05/19/20 1553)     LOS: 4 days    Time spent: 32 minutes    Sharen Hones, MD [facility]   To contact the attending provider between 7A-7P or the covering provider during after hours 7P-7A, please log into the web site [email or web address] and access using universal [place] password for that web site. If you do not have the password, please call the hospital operator.  05/20/2020, 9:59 AM

## 2020-05-20 NOTE — Progress Notes (Signed)
Physical Therapy Treatment Patient Details Name: Justin Griffith MRN: [id number] DOB: June 02, 1940 Today's Date: 05/20/2020    History of Present Illness Quasim Doyon is an 77yoM who comes to [facility] on 2/24 c substernal CP c raditation to Left shoulder, initial troponin >27,000. Pt admitted for NSTEMI. PMH: CAD, NSTEMI, HTN, HLD, DM, COPD, CVA, remote tobacco use, colonCA, PVD, tremor. Pt underwent cardiac cath on 2/25, then developed gross hematuria. Afternoon of 2/26 developed AMS, lethargy, hypotension, seens by neurology, concern for potential hospital delerium.    PT Comments    Patient easily woken, agreeable to PT. Denied pain initially but did report groin pain due to recent catheter removal. (Pt also had blood from penis noted during ambulation, RN notified). The session focused on pt endurance and activity tolerance as well as gait progression. Supine to sit with supervision, and good sitting balance noted. He ambulated ~52ft with RW and CGA twice, seated rest break in between bouts. No LOB noted though pt did exhibit increased work of breathing and decreased gait velocity. He reported at baseline he does have episodes of shortness of breath and current breathing limitations is no worse than his usual. Trialed on room air with RN consent spO2 >92% throughout session. Repositioned up in chair with all needs in reach. The patient would benefit from further skilled PT intervention to continue to progress towards goals. Recommendation remains appropriate.       Follow Up Recommendations  Home health PT;Supervision for mobility/OOB (may be scheduled for cardiac rehab, unsure at this time)     Equipment Recommendations  None recommended by PT    Recommendations for Other Services       Precautions / Restrictions Precautions Precautions: Fall Restrictions Weight Bearing Restrictions: No    Mobility  Bed Mobility Overal bed mobility: Modified Independent Bed Mobility: Supine to Sit      Supine to sit: Modified independent (Device/Increase time)          Transfers Overall transfer level: Needs assistance Equipment used: Rolling walker (2 wheeled) Transfers: Sit to/from Stand Sit to Stand: Min guard            Ambulation/Gait Ambulation/Gait assistance: Min guard Gait Distance (Feet):  (46ft x2, seated  rest break in between bouts) Assistive device: Rolling walker (2 wheeled)       General Gait Details: decreased velocity, some difficulty with turns, pt with increased work of breathing as well. No LOB.   Stairs             Wheelchair Mobility    Modified Rankin (Stroke Patients Only)       Balance Overall balance assessment: Mild deficits observed, not formally tested;History of Falls                                          Cognition Arousal/Alertness: Awake/alert Behavior During Therapy: WFL for tasks assessed/performed Overall Cognitive Status: Within Functional Limits for tasks assessed                                        Exercises      General Comments        Pertinent Vitals/Pain Pain Assessment: Faces Faces Pain Scale: Hurts a little bit Pain Location: Back Pain Descriptors / Indicators: Aching Pain Intervention(s): Limited activity within  patient's tolerance;Monitored during session;Repositioned    Home Living                      Prior Function            PT Goals (current goals can now be found in the care plan section) Progress towards PT goals: Progressing toward goals    Frequency    Min 2X/week      PT Plan Current plan remains appropriate    Co-evaluation              AM-PAC PT "6 Clicks" Mobility   Outcome Measure  Help needed turning from your back to your side while in a flat bed without using bedrails?: A Little Help needed moving from lying on your back to sitting on the side of a flat bed without using bedrails?: A Little Help  needed moving to and from a bed to a chair (including a wheelchair)?: None Help needed standing up from a chair using your arms (e.g., wheelchair or bedside chair)?: None Help needed to walk in hospital room?: None Help needed climbing 3-5 steps with a railing? : A Little 6 Click Score: 21    End of Session Equipment Utilized During Treatment: Gait belt Activity Tolerance: Patient tolerated treatment well Patient left: in chair;with call bell/phone within reach;with chair alarm set Nurse Communication: Mobility status PT Visit Diagnosis: Other abnormalities of gait and mobility (R26.89);Difficulty in walking, not elsewhere classified (R26.2)     Time: 0488-8916 PT Time Calculation (min) (ACUTE ONLY): 25 min  Charges:  $Therapeutic Exercise: 23-37 mins                    Lieutenant Diego PT, DPT 12:58 PM,05/20/20

## 2020-05-20 NOTE — Progress Notes (Signed)
NEUROLOGY CONSULTATION PROGRESS NOTE   Date of service: May 20, 2020 Patient Name: Justin Griffith MRN:  [id number] DOB:  1940-10-20  Brief HPI  ZARIN KNUPP is a 80 y.o. male with AMS following cardiac catheterization with stent placement   Interval Hx   AMS has resolved. Workup with MRI Brain demonstrated embolic appearing infarcts. He denies any arm or leg weakness, no numbness, no dysarthria. He is hard of hearing but that is long standing. No aphasia, no vertigo.  Vitals   Vitals:   05/20/20 0038 05/20/20 0523 05/20/20 0738 05/20/20 0744  BP: (!) 147/70 (!) 112/56 124/81 (!) 154/82  Pulse: 60 (!) 53 (!) 111 (!) 57  Resp: 16 18 (!) 23 (!) 21  Temp: (!) 97.5 F (36.4 C) 97.8 F (36.6 C) 99.7 F (37.6 C) 98.5 F (36.9 C)  TempSrc: Oral Oral Oral Oral  SpO2: 99% 95% 96% 99%  Weight:  117.2 kg    Height:         Body mass index is 37.08 kg/m.  Physical Exam   General: Laying comfortably in bed; in no acute distress.  HENT: Normal oropharynx and mucosa. Normal external appearance of ears and nose.  Neck: Supple, no pain or tenderness  CV: No JVD. No peripheral edema.  Pulmonary: Symmetric Chest rise. Normal respiratory effort.  Abdomen: Soft to touch, non-tender.  Ext: No cyanosis, edema, or deformity  Skin: No rash. Normal palpation of skin.   Musculoskeletal: Normal digits and nails by inspection. No clubbing.   Neurologic Examination  Mental status/Cognition: Alert, oriented to self, place, month and year, good attention.  Speech/language: Fluent, comprehension intact, object naming intact, repetition intact.  Cranial nerves:   CN II Pupils equal and reactive to light, no VF deficits    CN III,IV,VI EOM intact, no gaze preference or deviation, no nystagmus    CN V normal sensation in V1, V2, and V3 segments bilaterally    CN VII no asymmetry, no nasolabial fold flattening    CN VIII normal hearing to speech    CN IX & X normal palatal elevation, no uvular  deviation    CN XI 5/5 head turn and 5/5 shoulder shrug bilaterally    CN XII midline tongue protrusion    Motor:  Muscle bulk: normal, tone normal, pronator drift none tremor none Mvmt Root Nerve  Muscle Right Left Comments  SA C5/6 Ax Deltoid 5 5   EF C5/6 Mc Biceps 5 5   EE C6/7/8 Rad Triceps 5 5   WF C6/7 Med FCR 5 5   WE C7/8 PIN ECU 5 5   F Ab C8/T1 U ADM/FDI 5 5   HF L1/2/3 Fem Illopsoas 5 5   KE L2/3/4 Fem Quad 5 5   DF L4/5 D Peron Tib Ant 5 5   PF S1/2 Tibial Grc/Sol 5 5    Reflexes:  Right Left Comments  Pectoralis      Biceps (C5/6) 1 1   Brachioradialis (C5/6) 1 1    Triceps (C6/7) 1 1    Patellar (L3/4) 1 1    Achilles (S1)      Hoffman      Plantar     Jaw jerk    Sensation:  Light touch Intact throughout   Pin prick    Temperature    Vibration   Proprioception    Coordination/Complex Motor:  - Finger to Nose intact BL - Heel to shin intact BL - Rapid alternating movement  intact BL. - Gait: Deferred.  Labs   Basic Metabolic Panel:  Lab Results  Component Value Date   NA 133 (L) 05/20/2020   K 3.8 05/20/2020   CO2 27 05/20/2020   GLUCOSE 173 (H) 05/20/2020   BUN 36 (H) 05/20/2020   CREATININE 1.27 (H) 05/20/2020   CALCIUM 9.8 05/20/2020   GFRNONAA 57 (L) 05/20/2020   GFRAA 75 04/17/2020   HbA1c:  Lab Results  Component Value Date   HGBA1C 7.9 (H) 05/17/2020   LDL:  Lab Results  Component Value Date   LDLCALC 17 05/17/2020   Urine Drug Screen:     Component Value Date/Time   LABOPIA NONE DETECTED 12/14/2019 2015   COCAINSCRNUR NONE DETECTED 12/14/2019 2015   LABBENZ NONE DETECTED 12/14/2019 2015   AMPHETMU NONE DETECTED 12/14/2019 2015   THCU NONE DETECTED 12/14/2019 2015   LABBARB NONE DETECTED 12/14/2019 2015    Alcohol Level No results found for: ETH No results found for: PHENYTOIN, ZONISAMIDE, LAMOTRIGINE, LEVETIRACETA No results found for: PHENYTOIN, PHENOBARB, VALPROATE, CBMZ  Imaging and Diagnostic studies   MR  BRAIN WO CONTRAST  Acute micro embolic infarctions within both cerebral hemispheres scattered about. No large confluent infarction. The pattern is not typical of watershed insult. Older studies also showed small punctate acute infarctions. This raises the possibility of embolic disease from the heart or ascending aorta.  Late subacute to old infarctions now seen at the inferior cerebellum on the left.   Impression   Justin Griffith is a 80 y.o. male with AMS following cardiac catheterization with stent placement. We were asked to assess him for transient confusion which has resolved. He did have an episode of acute hypotension just prior to onset of AMS. His MRI show embolic appearing infarcts that are acute and maybe due to cardiac cath but he does have subacute infarcts on the MRI too which are unlikely to be explained entirely by cardiac cath.  NSTEMI s/p PCI with DES in LAD Acute and subacute embolic appearing infarcts on MRI Brain. Episode of transient confusion in the setting of transient hypotension which resolved.  Recommendations   - Frequent Neuro checks per stroke unit protocol - I ordered Vascular imaging with MRA Angio Head without contrast and US Carotid doppler - TTE is pending. - Recommend obtaining Lipid panel with LDL - Please start statin if LDL > 70 - Recommend HbA1c - Antithrombotic - on Aspirin and Brilinta after PCI with DES to his LAD. - Recommend DVT ppx - SBP goal - gradual normotension. - Recommend Telemetry monitoring for arrythmia - Recommend bedside swallow screen prior to PO intake. - Stroke education booklet - Recommend PT/OT/SLP consult - Recommend outpatient 4 week cardiac event monitor at discharge.  ______________________________________________________________________   Thank you for the opportunity to take part in the care of this patient. If you have any further questions, please contact the neurology consultation  attending.  Signed,  [place] Pager Number [phone]

## 2020-05-20 NOTE — Plan of Care (Signed)
  Problem: Education: Goal: Knowledge of General Education information will improve Description: Including pain rating scale, medication(s)/side effects and non-pharmacologic comfort measures Outcome: Progressing   Problem: Pain Managment: Goal: General experience of comfort will improve Outcome: Progressing   Problem: Safety: Goal: Ability to remain free from injury will improve Outcome: Progressing   Problem: Cardiovascular: Goal: Ability to achieve and maintain adequate cardiovascular perfusion will improve Outcome: Progressing

## 2020-05-20 NOTE — Progress Notes (Signed)
*  PRELIMINARY RESULTS* Echocardiogram 2D Echocardiogram has been performed.  Sherrie Sport 05/20/2020, 10:58 AM

## 2020-05-20 NOTE — Progress Notes (Signed)
   Subjective Patient sleeping this morning  Urine clear yellow in foley tubing  Urine culture 2/26 no growth  Foley can be removed from urology perspective  We will arrange follow-up with Dr. Erlene Quan in 2 to 4 weeks in clinic to discuss further work-up of recurrent gross hematuria   Billey Co, MD

## 2020-05-20 NOTE — Care Management Important Message (Signed)
Important Message  Patient Details  Name: Justin Griffith MRN: [id number] Date of Birth: May 10, 1940   Medicare Important Message Given:  Yes     Dannette Barbara 05/20/2020, 11:29 AM

## 2020-05-21 ENCOUNTER — Ambulatory Visit: Payer: Self-pay | Admitting: Physician Assistant

## 2020-05-21 LAB — CBC WITH DIFFERENTIAL/PLATELET
Abs Immature Granulocytes: 0.05 10*3/uL (ref 0.00–0.07)
Basophils Absolute: 0 10*3/uL (ref 0.0–0.1)
Basophils Relative: 1 %
Eosinophils Absolute: 0.2 10*3/uL (ref 0.0–0.5)
Eosinophils Relative: 2 %
HCT: 35.9 % — ABNORMAL LOW (ref 39.0–52.0)
Hemoglobin: 12.2 g/dL — ABNORMAL LOW (ref 13.0–17.0)
Immature Granulocytes: 1 %
Lymphocytes Relative: 19 %
Lymphs Abs: 1.5 10*3/uL (ref 0.7–4.0)
MCH: 30.9 pg (ref 26.0–34.0)
MCHC: 34 g/dL (ref 30.0–36.0)
MCV: 90.9 fL (ref 80.0–100.0)
Monocytes Absolute: 0.8 10*3/uL (ref 0.1–1.0)
Monocytes Relative: 11 %
Neutro Abs: 5.1 10*3/uL (ref 1.7–7.7)
Neutrophils Relative %: 66 %
Platelets: 196 10*3/uL (ref 150–400)
RBC: 3.95 MIL/uL — ABNORMAL LOW (ref 4.22–5.81)
RDW: 13.1 % (ref 11.5–15.5)
WBC: 7.7 10*3/uL (ref 4.0–10.5)
nRBC: 0 % (ref 0.0–0.2)

## 2020-05-21 LAB — GLUCOSE, CAPILLARY: Glucose-Capillary: 192 mg/dL — ABNORMAL HIGH (ref 70–99)

## 2020-05-21 LAB — BASIC METABOLIC PANEL
Anion gap: 7 (ref 5–15)
BUN: 27 mg/dL — ABNORMAL HIGH (ref 8–23)
CO2: 27 mmol/L (ref 22–32)
Calcium: 9.9 mg/dL (ref 8.9–10.3)
Chloride: 101 mmol/L (ref 98–111)
Creatinine, Ser: 0.92 mg/dL (ref 0.61–1.24)
GFR, Estimated: >60 mL/min (ref >60)
Glucose, Bld: 173 mg/dL — ABNORMAL HIGH (ref 70–99)
Potassium: 3.9 mmol/L (ref 3.5–5.1)
Sodium: 135 mmol/L (ref 135–145)

## 2020-05-21 LAB — MAGNESIUM: Magnesium: 2 mg/dL (ref 1.7–2.4)

## 2020-05-21 MED ORDER — TAMSULOSIN HCL 0.4 MG PO CAPS: 0.4000 mg | ORAL_CAPSULE | Freq: Every day | ORAL | 0 refills | Status: DC

## 2020-05-21 MED ORDER — TICAGRELOR 90 MG PO TABS: 90.0000 mg | ORAL_TABLET | Freq: Two times a day (BID) | ORAL | 0 refills | Status: DC

## 2020-05-21 MED ORDER — ASPIRIN 81 MG PO TBEC: 81.0000 mg | DELAYED_RELEASE_TABLET | Freq: Every day | ORAL | 0 refills | Status: DC

## 2020-05-21 MED ORDER — ISOSORBIDE MONONITRATE ER 30 MG PO TB24
30.0000 mg | ORAL_TABLET | Freq: Every day | ORAL | Status: DC
Start: 2020-05-21 — End: 2020-05-21
  Administered 2020-05-21: 30 mg via ORAL
  Filled 2020-05-21: qty 1

## 2020-05-21 MED ORDER — ISOSORBIDE MONONITRATE ER 30 MG PO TB24: 30.0000 mg | ORAL_TABLET | Freq: Every day | ORAL | 0 refills | Status: DC

## 2020-05-21 NOTE — Discharge Summary (Signed)
Physician Discharge Summary  Patient ID: Justin Griffith MRN: [id number] DOB/AGE: Aug 10, 1940 80 y.o.  Admit date: 05/16/2020 Discharge date: 05/21/2020  Admission Diagnoses:  Discharge Diagnoses:  Principal Problem:   NSTEMI (non-ST elevated myocardial infarction) [facility]) Active Problems:   Hypertension associated with diabetes ([place])   Type 2 diabetes mellitus with proteinuria (HCC)   Hyperlipidemia associated with type 2 diabetes mellitus (HCC)   Gastroesophageal reflux disease without esophagitis   COPD (chronic obstructive pulmonary disease) (HCC)   History of CVA (cerebrovascular accident)   Hypokalemia   Hypomagnesemia   Leukocytosis   Hypercalcemia   Acute on chronic diastolic CHF (congestive heart failure) ([place])   AKI (acute kidney injury) ([place])   Embolic stroke [facility])   Discharged Condition: fair  Hospital Course:  Justin Griffith a 80 y.o.malewith medical history significant ofCAD, non-STEMI, hypertension, hyperlipidemia, diabetes mellitus, COPD, asthma, stroke, former smoker, eosinophilic esophagitis, colon cancer, PVD, tremor, who presents with chest pain. Initial EKG in the emergency room showed Q wave from V1 to V5, no significant ST elevation. Troponin was more than27,000. Patient was placed on heparin drip. Heart cath was performed on 2/25, a drug-eluting stent was placed into LAD.Postoperatively, patient developed secondary short of breath, with giving IV Lasix for congestive heart failure.  Developed hypotension on 2/16, received fluid bolus.  Also started antibiotic for possible UTI with altered mental status.  Urine culture came back negative 2/28, Rocephin discontinued.  #1. Acute non-ST elevation myocardial infarction. Acute on chronic diastolic congestive heart failure. Patient is s/p LAD drug-eluting stent. Continue dual antiplatelet treatment. Patient has significant short of breath after heart cath, was started on nitroglycerin patch. He was also  giving IV Lasix. BNP has normalized, patient was put on oxygen for comfort, he did not have a significant hypoxemia. I will obtain home oxygen evaluation before discharge.  Due to persistently short of breath, patient was also repeated on echocardiogram, ejection fraction still normal at 60 to 65%. No valvular abnormality. Patient BNP has normalized, short of breath has been better. I will continue nitrates with Imdur 30 mg daily. Patient will be followed by cardiology as outpatient.  2.  Acute embolic stroke. Acute metabolic encephalopathy. Transient hypotension. Brain MRI came back with multiple micro infarcts.  Consistent with embolic stroke. Patient does not appear to have UTI, urine culture came back negative.  Antibiotic discontinued.  Patient might had an embolic event caused sudden drop of blood pressure as well as mental status changes. Patient condition has improved, mental status has improved. Discussed with neurology, will continue dual antiplatelet treatment. Patient will need 1 month of event recorder, which can be set up with cardiology with next visit.  #3.  Hematuria secondary to traumatic Foley catheter. Condition improved, discussed with Dr. Bernardo Heater, catheter is removed. Patient states that he has some prostate problems, will start Flomax, follow-up with urology as outpatient.  4.  Type 2 diabetes. Resume home treatment.  5.  Acute kidney injury. Hypomagnesemia Hypokalemia Condition has improved.  Renal function close to normal.   Consults: cardiology and urology  Significant Diagnostic Studies:  Echo: 1. Left ventricular ejection fraction, by estimation, is 60 to 65%. The left ventricle has normal function. The left ventricle has no regional wall motion abnormalities. Left ventricular diastolic parameters are consistent with Grade II diastolic dysfunction (pseudonormalization). 2. Right ventricular systolic function is normal. The right ventricular size is  normal. 3. The mitral valve is normal in structure. Trivial mitral valve regurgitation. 4. The aortic valve is  normal in structure. Aortic valve regurgitation is trivial.  Heart cath:  2nd Diag lesion is 100% stenosed.  Mid LAD lesion is 85% stenosed.  A drug-eluting stent was successfully placed using a STENT RESOLUTE ONYX 3.0X34.  Post intervention, there is a 0% residual stenosis.  Prox RCA lesion is 100% stenosed.  1st LPL lesion is 70% stenosed.  LPDA lesion is 50% stenosed.  Ost LM to Prox LAD lesion is 10% stenosed.  Mild to moderately reduced left ventricular function with anterior apical lateral hypokinesis ejection fraction between forty and 45%   Conclusion Successful cardiac cath right radial approach Mild to moderate depressed left ventricular function with anterior apical lateral hypokinesis ejection fraction around 40 to 45% Coronaries Left main large free of disease moderate calcification LAD large moderate calcification proximally diagonal 100% occluded mid LAD disease 85% Circumflex large left dominant minor irregularities Mild to moderate disease distal left PDA left PL No significant collaterals  Intervention Successful PCI and stent to mid LAD with DES 3.0 x 34 mm resolute Onyx Postdilated with a 3.5 x 15 mm [place] trek to 16 atm Lesion reduced from 85 down to 0% TIMI-3 flow was maintained throughout the procedure No complications  BILATERAL CAROTID DUPLEX ULTRASOUND  TECHNIQUE: Pearline Cables scale imaging, color Doppler and duplex ultrasound were performed of bilateral carotid and vertebral arteries in the neck.  COMPARISON:  Carotid Doppler ultrasound-03/29/2020; 12/15/2019  FINDINGS: Criteria: Quantification of carotid stenosis is based on velocity parameters that correlate the residual internal carotid diameter with NASCET-based stenosis levels, using the diameter of the distal internal carotid lumen as the denominator for stenosis  measurement.  The following velocity measurements were obtained:  RIGHT  ICA: 68/19 cm/sec  CCA: 00/17 cm/sec  SYSTOLIC ICA/CCA RATIO:  1.2  ECA: 193 cm/sec  LEFT  ICA: 79/25 cm/sec  CCA: 49/44 cm/sec  SYSTOLIC ICA/CCA RATIO:  1.3  ECA: 99 cm/sec  RIGHT CAROTID ARTERY: There is a minimal to moderate amount of eccentric echogenic plaque within the right carotid bulb (image 15), extending to involve the origin and proximal aspects of the right internal carotid artery (image 22), morphologically similar to the 11/2019 examination and not resulting in elevated peak systolic velocities within the interrogated course of the right internal carotid artery to suggest a hemodynamically significant stenosis.  RIGHT VERTEBRAL ARTERY:  Antegrade flow  LEFT CAROTID ARTERY: There is a moderate amount of eccentric echogenic partially shadowing plaque within the left carotid bulb (image 46 and 47), extending to involve the origin and proximal aspects of the left internal carotid artery (image 54) unchanged to slightly progressed compared to the 11/2019 examination, though again not resulting in elevated peak systolic velocities within the interrogated course the left internal carotid artery to suggest a hemodynamically significant stenosis.  LEFT VERTEBRAL ARTERY:  Antegrade flow  IMPRESSION: Minimal to moderate amount of bilateral atherosclerotic plaque, left greater than right, unchanged to slightly progressed compared to the 11/2019 examination, though again not resulting in a hemodynamically significant stenosis within either internal carotid artery.   Electronically Signed   By: Sandi Mariscal M.D.   On: 05/20/2020 12:43  MRA HEAD WITHOUT CONTRAST  TECHNIQUE: Angiographic images of the Circle of Willis were obtained using MRA technique without intravenous contrast.  COMPARISON:  MRI head May 19, 2020  FINDINGS: Evaluation is mildly limited  by patient motion. Within this limitation:  Anterior circulation: No large vessel occlusion, proximal hemodynamically significant stenosis or aneurysm.  Posterior circulation: No large vessel occlusion, proximal hemodynamically significant  stenosis or aneurysm. Bilateral PICAs are patent proximally. Bilateral posterior communicating arteries and P1 PCAs supply the more distal posterior cerebral arteries.  IMPRESSION: No large vessel occlusion or proximal hemodynamically significant stenosis.   Electronically Signed   By: Margaretha Sheffield MD   On: 05/20/2020 13:42  MRI HEAD WITHOUT CONTRAST  TECHNIQUE: Multiplanar, multiecho pulse sequences of the brain and surrounding structures were obtained without intravenous contrast.  COMPARISON:  Head CT yesterday.  Brain MRI 04/21/2019.  FINDINGS: Brain: Diffusion imaging shows a punctate acute infarction at the inferior posterior temporal lobe on the right. Subcentimeter white matter infarction is seen in the right frontal white matter. Punctate acute infarction at the right posterior parietal cortex and right frontal vertex. Left hemisphere shows 5 or 6 punctate infarctions scattered within the occipital and parietal regions. The pattern is most consistent with micro embolic infarctions. The pattern is not typical of watershed insult. Elsewhere, chronic small-vessel ischemic changes affect the pons. There are old or late subacute infarctions at the inferior cerebellum on the left. Cerebral hemispheres otherwise show moderate chronic small-vessel ischemic changes affecting the deep and subcortical white matter. Minor old small vessel insults within the thalami and basal ganglia.  Vascular: Major vessels at the base of the brain show flow.  Skull and upper cervical spine: Negative  Sinuses/Orbits: Sinuses are clear. Small amount of fluid in the mastoid air cells.  Other: None  IMPRESSION: Acute micro embolic  infarctions within both cerebral hemispheres scattered about. No large confluent infarction. The pattern is not typical of watershed insult. Older studies also showed small punctate acute infarctions. This raises the possibility of embolic disease from the heart or ascending aorta.  Late subacute to old infarctions now seen at the inferior cerebellum on the left.   Electronically Signed   By: Nelson Chimes M.D.   On: 05/19/2020 23:52  Treatments: Cardiac cath, IV Lasix, dual antiplatelet treatment.  Discharge Exam: Blood pressure (!) 168/79, pulse 65, temperature (!) 97.5 F (36.4 C), temperature source Oral, resp. rate 19, height '5\' 10"'  (1.778 m), weight 118.1 kg, SpO2 98 %. General appearance: alert and cooperative Resp: clear to auscultation bilaterally Cardio: regular rate and rhythm, S1, S2 normal, no murmur, click, rub or gallop GI: soft, non-tender; bowel sounds normal; no masses,  no organomegaly Extremities: extremities normal, atraumatic, no cyanosis or edema  Disposition: Discharge disposition: 01-Home or Self Care       Discharge Instructions    AMB Referral to Cardiac Rehabilitation - Phase II   Complete by: As directed    Diagnosis:  Coronary Stents NSTEMI     After initial evaluation and assessments completed: Virtual Based Care may be provided alone or in conjunction with Phase 2 Cardiac Rehab based on patient barriers.: Yes   Diet - low sodium heart healthy   Complete by: As directed    Increase activity slowly   Complete by: As directed      Allergies as of 05/21/2020      Reactions   Dulaglutide Diarrhea, Other (See Comments)   Liraglutide Diarrhea, Other (See Comments)   Farxiga [dapagliflozin] Rash   Jardiance [empagliflozin] Rash      Medication List    STOP taking these medications   meloxicam 7.5 MG tablet Commonly known as: MOBIC     TAKE these medications   acyclovir 400 MG tablet Commonly known as: ZOVIRAX Take 400 mg by mouth  2 (two) times daily.   aspirin 325 MG EC tablet Take 1 tablet (  325 mg total) by mouth daily.   azelastine 0.1 % nasal spray Commonly known as: ASTELIN Place 2 sprays into both nostrils 2 (two) times daily.   bacitracin-neomycin-polymyxin-hydrocortisone 1 % ophthalmic ointment Commonly known as: CORTISPORIN Place 1 application into both eyes 2 (two) times daily.   carvedilol 12.5 MG tablet Commonly known as: COREG Take 2 tablets (25 mg total) by mouth 2 (two) times daily with a meal.   clobetasol cream 0.05 % Commonly known as: TEMOVATE Apply 1 application topically 2 (two) times daily. Apply to bilateral lower legs.   cloNIDine 0.1 MG tablet Commonly known as: CATAPRES Take 1 tablet (0.1 mg total) by mouth 2 (two) times daily.   clotrimazole-betamethasone cream Commonly known as: Lotrisone Apply 1 application topically 2 (two) times daily.   donepezil 5 MG tablet Commonly known as: Aricept Take 1 tablet (5 mg total) by mouth at bedtime.   furosemide 20 MG tablet Commonly known as: LASIX Take 1 tablet (20 mg total) by mouth daily.   gabapentin 300 MG capsule Commonly known as: NEURONTIN Take 1 capsule by mouth 3 (three) times daily.   insulin glargine 100 UNIT/ML injection Commonly known as: Lantus Inject 0.25 mLs (25 Units total) into the skin daily.   ipratropium-albuterol 0.5-2.5 (3) MG/3ML Soln Commonly known as: DUONEB Take 3 mLs by nebulization every 6 (six) hours.   isosorbide mononitrate 30 MG 24 hr tablet Commonly known as: IMDUR Take 1 tablet (30 mg total) by mouth daily. Start taking on: May 22, 2020   metFORMIN 1000 MG tablet Commonly known as: GLUCOPHAGE Take 1,000 mg by mouth 2 (two) times daily with a meal.   mupirocin ointment 2 % Commonly known as: BACTROBAN Apply 1 application topically 2 (two) times daily. To open blisters bilateral legs.   olmesartan 40 MG tablet Commonly known as: BENICAR Take 1 tablet (40 mg total) by mouth  daily.   primidone 50 MG tablet Commonly known as: MYSOLINE Take 1 tablet (50 mg total) by mouth 2 (two) times daily.   rosuvastatin 40 MG tablet Commonly known as: CRESTOR Take 1 tablet (40 mg total) by mouth at bedtime.   Spiriva Respimat 2.5 MCG/ACT Aers Generic drug: Tiotropium Bromide Monohydrate Inhale 2 puffs into the lungs daily.   tamsulosin 0.4 MG Caps capsule Commonly known as: FLOMAX Take 0.4 mg by mouth daily. Patient restarted on his own due to trouble urinating What changed: Another medication with the same name was added. Make sure you understand how and when to take each.   tamsulosin 0.4 MG Caps capsule Commonly known as: Flomax Take 1 capsule (0.4 mg total) by mouth daily. What changed: You were already taking a medication with the same name, and this prescription was added. Make sure you understand how and when to take each.   ticagrelor 90 MG Tabs tablet Commonly known as: BRILINTA Take 1 tablet (90 mg total) by mouth 2 (two) times daily.   vitamin B-12 500 MCG tablet Commonly known as: CYANOCOBALAMIN Take 2,000 mcg by mouth at bedtime.   Vitamin D (Cholecalciferol) 25 MCG (1000 UT) Caps Take 2,000 mcg by mouth at bedtime.       Follow-up Information    Vigg, Avanti, MD Follow up in 1 week(s).   Specialty: Internal Medicine Contact information: [place] [address] [phone]        Yolonda Kida, MD Follow up in 1 week(s).   Specialties: Cardiology, Internal Medicine Contact information: [address]  [place] [address] [phone]        Abbie Sons, MD Follow up in 2 week(s).   Specialty: Urology Contact information: [address] [place] [address] [phone]              35 minutes Signed: Sharen Hones 05/21/2020, 9:56 AM

## 2020-05-21 NOTE — Progress Notes (Signed)
Brief Neuro Update:  Reviewed TTE with EF of 60-65%, no shunt on color flow doppler. LDL of 19, continue home Rosuvastatin. HbA1c of 7.8, recommend PCP follow up for glucose control.  Recs: - outpatient 4 week cardiac event monitor at discharge. - Follow up with stroke clinic in 2-3 months - Follow up with PCP for elevated HbA1c. - Neurology inpatient team will signoff. Please feel free to contact us with any questions or concerns.  [place] Pager Number [phone]

## 2020-05-21 NOTE — Progress Notes (Signed)
Patient and family given discharge instructions, medication education and follow up appointment instructions. Patient has all of belongings. Telemetry d/c and IVs removed, clean dry and in tact. Patient escorted by volunteer services via wheelchair.

## 2020-05-21 NOTE — TOC Initial Note (Signed)
Transition of Care [facility]) - Initial/Assessment Note    Patient Details  Name: Justin Griffith MRN: [id number] Date of Birth: 02/29/1941  [facility] CM/SW Contact:    Eileen Stanford, LCSW Phone Number: 05/21/2020, 10:07 AM  Clinical Narrative:  Pt lives at home with spouse. Pt's spouse and daughter present at beside. Pt and pt's family are agreeable to home health and did not have a agency preference. Pt has the DME he needs at home. Pt's daughter states pt drives himself to appointments. If needed his spouse can drive him. Pt has a new PCP at [facility]. Pt uses [facility] in [place]. Pt will also need home 02. [employer] will deliver.                 Expected Discharge Plan: [place] Barriers to Discharge: Continued Medical Work up   Patient Goals and CMS Choice Patient states their goals for this hospitalization and ongoing recovery are:: to get better   Choice offered to / list presented to : [place]  Expected Discharge Plan and Services Expected Discharge Plan: [place] In-house Referral: NA Discharge Planning Services: NA Post Acute Care Choice: [place] arrangements for the past 2 months: Single Family Home Expected Discharge Date: 05/21/20               DME Arranged: N/A DME Agency: NA       HH Arranged: PT,RN [place] Agency: [place] ([place]) Date HH Agency Contacted: 05/21/20 Time HH Agency Contacted: 91 Representative spoke with at [place]  Prior Living Arrangements/Services Living arrangements for the past 2 months: [place] with:: Spouse Patient language and need for interpreter reviewed:: Yes Do you feel safe going back to the place where you live?: Yes      Need for Family Participation in Patient Care: Yes (Comment) Care giver support system in place?: Yes (comment) Current home services: Homehealth aide Criminal  Activity/Legal Involvement Pertinent to Current Situation/Hospitalization: No - Comment as needed  Activities of Daily Living Home Assistive Devices/Equipment: Environmental consultant (specify type) ADL Screening (condition at time of admission) Patient's cognitive ability adequate to safely complete daily activities?: Yes Is the patient deaf or have difficulty hearing?: Yes Does the patient have difficulty seeing, even when wearing glasses/contacts?: No Does the patient have difficulty concentrating, remembering, or making decisions?: No Patient able to express need for assistance with ADLs?: Yes Does the patient have difficulty dressing or bathing?: No Independently performs ADLs?: Yes (appropriate for developmental age) Does the patient have difficulty walking or climbing stairs?: No Weakness of Legs: Both Weakness of Arms/Hands: None  Permission Sought/Granted Permission sought to share information with : Family Supports Permission granted to share information with : No  Share Information with NAME: [place] granted to share info w Relationship: daughter     Emotional Assessment Appearance:: Appears stated age Attitude/Demeanor/Rapport: Engaged Affect (typically observed): Accepting Orientation: : Oriented to Self,Oriented to Place,Oriented to  Time,Oriented to Situation Alcohol / Substance Use: Not Applicable Psych Involvement: No (comment)  Admission diagnosis:  NSTEMI (non-ST elevated myocardial infarction) [facility]) [I21.4] Patient Active Problem List   Diagnosis Date Noted  . Embolic stroke ([place]) 67/34/1937  . AKI (acute kidney injury) ([place]) 05/18/2020  . Leukocytosis 05/16/2020  . Hypercalcemia 05/16/2020  . Acute on chronic diastolic CHF (congestive heart failure) ([place]) 05/16/2020  . At high risk for falls 05/08/2020  .  History of cold sores 05/06/2020  . Hypomagnesemia 03/29/2020  . NSTEMI (non-ST elevated myocardial infarction) ([place]) 03/28/2020  . Hypokalemia  03/28/2020  . Elevated TSH 02/25/2020  . Poor mobility 02/02/2020  . Coronary artery disease 01/23/2020  . History of CVA (cerebrovascular accident) 01/23/2020  . Memory changes 01/01/2020  . Chronic venous insufficiency 05/23/2019  . B12 deficiency 04/23/2019  . Constipation 04/21/2019  . Tremor 11/14/2018  . Morbid obesity ([place]) 09/07/2017  . COPD (chronic obstructive pulmonary disease) ([place]) 04/29/2017  . Intertrigo 02/02/2017  . BPH (benign prostatic hyperplasia) 07/23/2016  . Advanced care planning/counseling discussion 07/23/2016  . Eosinophilic esophagitis 53/64/6803  . PAD (peripheral artery disease) ([place]) 01/08/2016  . Gastroesophageal reflux disease without esophagitis 01/06/2016  . Hyperlipidemia associated with type 2 diabetes mellitus ([place]) 01/16/2015  . Sleep apnea 01/16/2015  . BMI 40.0-44.9, adult ([place]) 01/16/2015  . Hypertension associated with diabetes ([place]) 10/11/2014  . Type 2 diabetes mellitus with proteinuria ([place]) 10/11/2014  . Back pain 10/11/2014  . History of colon cancer    PCP:  Charlynne Cousins, MD Pharmacy:   [place], [place] [address] Phone: [phone] Fax: [place], [place]. [place] [address] Phone: [phone] Fax: [phone]     Social Determinants of Health (SDOH) Interventions    Readmission Risk Interventions Readmission Risk Prevention Plan 05/21/2020 05/18/2020  Transportation Screening Complete Complete  PCP or Specialist Appt within 3-5 Days Complete -  HRI or [place] Complete Complete  Social Work Consult for [place] Planning/Counseling Complete Complete  Palliative Care Screening Not Applicable Not Applicable  Medication Review Press photographer) Complete Complete  Some recent data might be hidden

## 2020-05-21 NOTE — Progress Notes (Signed)
SATURATION QUALIFICATIONS: (This note is used to comply with regulatory documentation for home oxygen) ? ?Patient Saturations on Room Air at Rest = 96% ? ?Patient Saturations on Room Air while Ambulating = 95% ? ?Please briefly explain why patient needs home oxygen: ?

## 2020-05-22 ENCOUNTER — Ambulatory Visit: Payer: Medicare Other | Admitting: Internal Medicine

## 2020-05-22 ENCOUNTER — Telehealth: Payer: Self-pay

## 2020-05-22 ENCOUNTER — Other Ambulatory Visit: Payer: Self-pay

## 2020-05-22 ENCOUNTER — Telehealth: Payer: Self-pay | Admitting: Internal Medicine

## 2020-05-22 NOTE — Telephone Encounter (Signed)
Can we get patient scheduled for a hospital follow asap 1 hour appt, he needs home health orders

## 2020-05-22 NOTE — Telephone Encounter (Signed)
Tiffany, from [facility], calling stating that they received a referral for pt to start [facility] services. She states that they are looking to get an approval to see pt on 05/24/20. Please advise.      [phone] opt 2 secure line

## 2020-05-22 NOTE — Telephone Encounter (Signed)
Called pt scheduled for tomorrow °

## 2020-05-22 NOTE — Telephone Encounter (Signed)
Transition Care Management Follow-up Telephone Call  Date of discharge and from where: 05/21/2020 [name]  How have you been since you were released from the hospital? Terrible. Patient did not want to talk. He stated for some one to call him back in a a week   Follow up appointments reviewed:   PCP Hospital f/u appt confirmed? Patient did not want to talk. Stated for someone to call him back in a week

## 2020-05-23 ENCOUNTER — Ambulatory Visit (INDEPENDENT_AMBULATORY_CARE_PROVIDER_SITE_OTHER): Payer: Medicare Other | Admitting: Internal Medicine

## 2020-05-23 ENCOUNTER — Encounter: Payer: Self-pay | Admitting: Internal Medicine

## 2020-05-23 ENCOUNTER — Other Ambulatory Visit: Payer: Self-pay

## 2020-05-23 VITALS — BP 162/70 | HR 68 | Temp 97.7°F | Ht 70.0 in | Wt 260.0 lb

## 2020-05-23 DIAGNOSIS — R350 Frequency of micturition: Secondary | ICD-10-CM

## 2020-05-23 NOTE — Telephone Encounter (Signed)
error °

## 2020-05-23 NOTE — Progress Notes (Signed)
Ht 5\' 10"  (1.778 m)   Wt 260 lb (117.9 kg)   BMI 37.31 kg/m    Subjective:    Patient ID: Justin Griffith, male    DOB: Aug 03, 1940, 80 y.o.   MRN: [id number]  HPI: Justin Griffith is a 80 y.o. male  Patient is here for hospital follow-up.  He was admitted from 2/24 up until 05/21/2020.  For NSTEMI his troponins were 27,000.  Patient was placed on heparin drip had a CAD with a drug-eluting stent placement to the LAD on 25 February. Patient developed shortness of breath was given IV Lasix for CHF postoperatively.  He did not hypertension on 216 was given fluids to help him resuscitate from such. He did have a UTI for which he was treated with Rocephin and had some altered mental status.  He echo in the hospital which shows an ejection fraction of 60 to 65% no valvular abnormalities. He was placed on nitrates i.e. Imdur 30 mg daily for such along with aspirin and Plavix. Note patient also had an acute embolic stroke with transient hypertension had a brain MRI which showed multiple micro infarcts consistent with an embolic stroke.  That probably was the cause of his altered mental status along with hypotension. He is supposed to have an event monitor that would be set up with cardiology on his next visit with them. Patient's daughter is with him at this visit she is a Designer, jewellery in [place] and is aware of the fact that he needs to see cardiology ASAP he is to call their office in the morning tomorrow to set up an appointment.    Patient did have hematuria secondary to a traumatic Foley catheter insertion.  Is on Flomax for his BPH and is supposed to follow-up with urology as outpatient traumatic  ve  Hematuria This is a new problem. The current episode started 1 to 4 weeks ago. He describes the hematuria as gross hematuria. The pain is moderate. Obstructive symptoms do not include dribbling, incomplete emptying, an intermittent stream, a slower stream, straining or a weak stream. Pertinent  negatives include no abdominal pain, bladder pain, bone pain, chills, dysuria, facial swelling, fever, flank pain, genital pain, hematospermia, hesitancy, inability to urinate, nausea, urinary retention, vomiting or weight loss. His past medical history is significant for BPH.    Chief Complaint  Patient presents with  . Hospitalization Follow-up    Follow up from [facility] for NSTEMI. Not feeling better but not feeling worse. Patient's daughter states that he is feeling weak. Bladder infection    Relevant past medical, surgical, family and social history reviewed and updated as indicated. Interim medical history since our last visit reviewed. Allergies and medications reviewed and updated.  Review of Systems  Constitutional: Negative for chills, fever and weight loss.  HENT: Negative for facial swelling.   Gastrointestinal: Negative for abdominal pain, nausea and vomiting.  Genitourinary: Positive for hematuria. Negative for dysuria, flank pain, hesitancy and incomplete emptying.    Per HPI unless specifically indicated above     Objective:    Ht 5\' 10"  (1.778 m)   Wt 260 lb (117.9 kg)   BMI 37.31 kg/m   Wt Readings from Last 3 Encounters:  05/23/20 260 lb (117.9 kg)  05/21/20 260 lb 6.4 oz (118.1 kg)  05/15/20 268 lb (121.6 kg)    Physical Exam Vitals and nursing note reviewed.  Constitutional:      General: He is not in acute distress.  Appearance: Normal appearance. He is not ill-appearing or diaphoretic.  HENT:     Head: Normocephalic and atraumatic.     Right Ear: Tympanic membrane and external ear normal. There is no impacted cerumen.     Left Ear: External ear normal.     Nose: No congestion or rhinorrhea.     Mouth/Throat:     Pharynx: No oropharyngeal exudate or posterior oropharyngeal erythema.  Eyes:     Conjunctiva/sclera: Conjunctivae normal.     Pupils: Pupils are equal, round, and reactive to light.  Cardiovascular:     Rate and Rhythm: Normal rate and  regular rhythm.     Heart sounds: No murmur heard. No friction rub. No gallop.   Pulmonary:     Effort: No respiratory distress.     Breath sounds: No stridor. No wheezing or rhonchi.  Chest:     Chest wall: No tenderness.  Abdominal:     General: Abdomen is flat. Bowel sounds are normal.     Palpations: Abdomen is soft.     Tenderness: There is no abdominal tenderness. There is no guarding.  Musculoskeletal:        General: No swelling or deformity.     Cervical back: Normal range of motion and neck supple. No rigidity or tenderness.     Right lower leg: No edema.     Left lower leg: No edema.  Skin:    General: Skin is warm and dry.     Findings: No erythema.  Neurological:     Mental Status: He is alert and oriented to person, place, and time. Mental status is at baseline.     Motor: Weakness present.     Comments: Lower ext weakness noted bilaterally.   Psychiatric:        Mood and Affect: Mood normal.        Behavior: Behavior normal.        Thought Content: Thought content normal.        Judgment: Judgment normal.     Results for orders placed or performed during the hospital encounter of 05/16/20  SARS CORONAVIRUS 2 (TAT 6-24 HRS) Nasopharyngeal Nasopharyngeal Swab   Specimen: Nasopharyngeal Swab  Result Value Ref Range   SARS Coronavirus 2 NEGATIVE NEGATIVE  Urine Culture   Specimen: Urine, Random  Result Value Ref Range   Specimen Description      URINE, RANDOM Performed at [facility], [address] [place], [place] [address]    Special Requests      NONE Performed at [facility], [address] [place], [place] [address]    Culture      NO GROWTH Performed at [facility], [place] [address], [place], [place] [address]    Report Status 05/19/2020 FINAL   Basic metabolic panel  Result Value Ref Range   Sodium 133 (L) 135 - 145 mmol/L   Potassium 3.7 3.5 - 5.1 mmol/L   Chloride 100 98 - 111 mmol/L   CO2 22 22 - 32 mmol/L    Glucose, Bld 170 (H) 70 - 99 mg/dL   BUN 13 8 - 23 mg/dL   Creatinine, Ser 1.03 0.61 - 1.24 mg/dL   Calcium 10.7 (H) 8.9 - 10.3 mg/dL   GFR, Estimated >60 >60 mL/min   Anion gap 11 5 - 15  CBC  Result Value Ref Range   WBC 14.6 (H) 4.0 - 10.5 K/uL   RBC 4.80 4.22 - 5.81 MIL/uL   Hemoglobin  14.8 13.0 - 17.0 g/dL   HCT 42.4 39.0 - 52.0 %   MCV 88.3 80.0 - 100.0 fL   MCH 30.8 26.0 - 34.0 pg   MCHC 34.9 30.0 - 36.0 g/dL   RDW 13.0 11.5 - 15.5 %   Platelets 227 150 - 400 K/uL   nRBC 0.0 0.0 - 0.2 %  APTT  Result Value Ref Range   aPTT 25 24 - 36 seconds  Protime-INR  Result Value Ref Range   Prothrombin Time 13.8 11.4 - 15.2 seconds   INR 1.1 0.8 - 1.2  Heparin level (unfractionated)  Result Value Ref Range   Heparin Unfractionated 0.15 (L) 0.30 - 0.70 IU/mL  Brain natriuretic peptide  Result Value Ref Range   B Natriuretic Peptide 339.4 (H) 0.0 - 100.0 pg/mL  Glucose, capillary  Result Value Ref Range   Glucose-Capillary 126 (H) 70 - 99 mg/dL  CBC  Result Value Ref Range   WBC 8.8 4.0 - 10.5 K/uL   RBC 4.40 4.22 - 5.81 MIL/uL   Hemoglobin 13.8 13.0 - 17.0 g/dL   HCT 39.5 39.0 - 52.0 %   MCV 89.8 80.0 - 100.0 fL   MCH 31.4 26.0 - 34.0 pg   MCHC 34.9 30.0 - 36.0 g/dL   RDW 13.3 11.5 - 15.5 %   Platelets 195 150 - 400 K/uL   nRBC 0.0 0.0 - 0.2 %  Hemoglobin A1c  Result Value Ref Range   Hgb A1c MFr Bld 7.9 (H) 4.8 - 5.6 %   Mean Plasma Glucose 180.03 mg/dL  Lipid panel  Result Value Ref Range   Cholesterol 78 0 - 200 mg/dL   Triglycerides 152 (H) <150 mg/dL   HDL 31 (L) >40 mg/dL   Total CHOL/HDL Ratio 2.5 RATIO   VLDL 30 0 - 40 mg/dL   LDL Cholesterol 17 0 - 99 mg/dL  Basic metabolic panel  Result Value Ref Range   Sodium 137 135 - 145 mmol/L   Potassium 3.8 3.5 - 5.1 mmol/L   Chloride 103 98 - 111 mmol/L   CO2 29 22 - 32 mmol/L   Glucose, Bld 150 (H) 70 - 99 mg/dL   BUN 16 8 - 23 mg/dL   Creatinine, Ser 0.98 0.61 - 1.24 mg/dL   Calcium 10.2 8.9 - 10.3  mg/dL   GFR, Estimated >60 >60 mL/min   Anion gap 5 5 - 15  Glucose, capillary  Result Value Ref Range   Glucose-Capillary 157 (H) 70 - 99 mg/dL  Heparin level (unfractionated)  Result Value Ref Range   Heparin Unfractionated 0.24 (L) 0.30 - 0.70 IU/mL  Glucose, capillary  Result Value Ref Range   Glucose-Capillary 186 (H) 70 - 99 mg/dL  CBC with Differential/Platelet  Result Value Ref Range   WBC 10.5 4.0 - 10.5 K/uL   RBC 4.43 4.22 - 5.81 MIL/uL   Hemoglobin 13.7 13.0 - 17.0 g/dL   HCT 39.5 39.0 - 52.0 %   MCV 89.2 80.0 - 100.0 fL   MCH 30.9 26.0 - 34.0 pg   MCHC 34.7 30.0 - 36.0 g/dL   RDW 13.2 11.5 - 15.5 %   Platelets 206 150 - 400 K/uL   nRBC 0.0 0.0 - 0.2 %   Neutrophils Relative % 72 %   Neutro Abs 7.6 1.7 - 7.7 K/uL   Lymphocytes Relative 15 %   Lymphs Abs 1.5 0.7 - 4.0 K/uL   Monocytes Relative 12 %   Monocytes Absolute 1.3 (H)  0.1 - 1.0 K/uL   Eosinophils Relative 0 %   Eosinophils Absolute 0.0 0.0 - 0.5 K/uL   Basophils Relative 0 %   Basophils Absolute 0.0 0.0 - 0.1 K/uL   Immature Granulocytes 1 %   Abs Immature Granulocytes 0.06 0.00 - 0.07 K/uL  Basic metabolic panel  Result Value Ref Range   Sodium 137 135 - 145 mmol/L   Potassium 3.4 (L) 3.5 - 5.1 mmol/L   Chloride 99 98 - 111 mmol/L   CO2 27 22 - 32 mmol/L   Glucose, Bld 188 (H) 70 - 99 mg/dL   BUN 24 (H) 8 - 23 mg/dL   Creatinine, Ser 1.49 (H) 0.61 - 1.24 mg/dL   Calcium 10.4 (H) 8.9 - 10.3 mg/dL   GFR, Estimated 47 (L) >60 mL/min   Anion gap 11 5 - 15  Magnesium  Result Value Ref Range   Magnesium 1.6 (L) 1.7 - 2.4 mg/dL  Glucose, capillary  Result Value Ref Range   Glucose-Capillary 214 (H) 70 - 99 mg/dL  Brain natriuretic peptide  Result Value Ref Range   B Natriuretic Peptide 187.2 (H) 0.0 - 100.0 pg/mL  Glucose, capillary  Result Value Ref Range   Glucose-Capillary 175 (H) 70 - 99 mg/dL   Comment 1 Notify RN   Glucose, capillary  Result Value Ref Range   Glucose-Capillary 190 (H)  70 - 99 mg/dL  Glucose, capillary  Result Value Ref Range   Glucose-Capillary 202 (H) 70 - 99 mg/dL  Glucose, capillary  Result Value Ref Range   Glucose-Capillary 207 (H) 70 - 99 mg/dL  Urinalysis, Complete w Microscopic Urine, Catheterized  Result Value Ref Range   Color, Urine RED (A) YELLOW   APPearance CLOUDY (A) CLEAR   Specific Gravity, Urine 1.016 1.005 - 1.030   pH  5.0 - 8.0    TEST NOT REPORTED DUE TO COLOR INTERFERENCE OF URINE PIGMENT   Glucose, UA (A) NEGATIVE mg/dL    TEST NOT REPORTED DUE TO COLOR INTERFERENCE OF URINE PIGMENT   Hgb urine dipstick (A) NEGATIVE    TEST NOT REPORTED DUE TO COLOR INTERFERENCE OF URINE PIGMENT   Bilirubin Urine (A) NEGATIVE    TEST NOT REPORTED DUE TO COLOR INTERFERENCE OF URINE PIGMENT   Ketones, ur (A) NEGATIVE mg/dL    TEST NOT REPORTED DUE TO COLOR INTERFERENCE OF URINE PIGMENT   Protein, ur (A) NEGATIVE mg/dL    TEST NOT REPORTED DUE TO COLOR INTERFERENCE OF URINE PIGMENT   Nitrite (A) NEGATIVE    TEST NOT REPORTED DUE TO COLOR INTERFERENCE OF URINE PIGMENT   Leukocytes,Ua (A) NEGATIVE    TEST NOT REPORTED DUE TO COLOR INTERFERENCE OF URINE PIGMENT   RBC / HPF >50 (H) 0 - 5 RBC/hpf   WBC, UA >50 (H) 0 - 5 WBC/hpf   Bacteria, UA MANY (A) NONE SEEN   Squamous Epithelial / LPF NONE SEEN 0 - 5  Glucose, capillary  Result Value Ref Range   Glucose-Capillary 139 (H) 70 - 99 mg/dL  CBC with Differential/Platelet  Result Value Ref Range   WBC 7.7 4.0 - 10.5 K/uL   RBC 3.96 (L) 4.22 - 5.81 MIL/uL   Hemoglobin 12.4 (L) 13.0 - 17.0 g/dL   HCT 35.9 (L) 39.0 - 52.0 %   MCV 90.7 80.0 - 100.0 fL   MCH 31.3 26.0 - 34.0 pg   MCHC 34.5 30.0 - 36.0 g/dL   RDW 13.2 11.5 - 15.5 %   Platelets  169 150 - 400 K/uL   nRBC 0.0 0.0 - 0.2 %   Neutrophils Relative % 66 %   Neutro Abs 5.1 1.7 - 7.7 K/uL   Lymphocytes Relative 20 %   Lymphs Abs 1.5 0.7 - 4.0 K/uL   Monocytes Relative 11 %   Monocytes Absolute 0.9 0.1 - 1.0 K/uL   Eosinophils  Relative 1 %   Eosinophils Absolute 0.1 0.0 - 0.5 K/uL   Basophils Relative 1 %   Basophils Absolute 0.1 0.0 - 0.1 K/uL   Immature Granulocytes 1 %   Abs Immature Granulocytes 0.06 0.00 - 0.07 K/uL  Basic metabolic panel  Result Value Ref Range   Sodium 134 (L) 135 - 145 mmol/L   Potassium 3.5 3.5 - 5.1 mmol/L   Chloride 100 98 - 111 mmol/L   CO2 27 22 - 32 mmol/L   Glucose, Bld 166 (H) 70 - 99 mg/dL   BUN 39 (H) 8 - 23 mg/dL   Creatinine, Ser 1.65 (H) 0.61 - 1.24 mg/dL   Calcium 9.9 8.9 - 10.3 mg/dL   GFR, Estimated 42 (L) >60 mL/min   Anion gap 7 5 - 15  Magnesium  Result Value Ref Range   Magnesium 2.1 1.7 - 2.4 mg/dL  Glucose, capillary  Result Value Ref Range   Glucose-Capillary 164 (H) 70 - 99 mg/dL  Glucose, capillary  Result Value Ref Range   Glucose-Capillary 151 (H) 70 - 99 mg/dL  Glucose, capillary  Result Value Ref Range   Glucose-Capillary 192 (H) 70 - 99 mg/dL  Glucose, capillary  Result Value Ref Range   Glucose-Capillary 193 (H) 70 - 99 mg/dL  Basic metabolic panel  Result Value Ref Range   Sodium 133 (L) 135 - 145 mmol/L   Potassium 3.8 3.5 - 5.1 mmol/L   Chloride 99 98 - 111 mmol/L   CO2 27 22 - 32 mmol/L   Glucose, Bld 173 (H) 70 - 99 mg/dL   BUN 36 (H) 8 - 23 mg/dL   Creatinine, Ser 1.27 (H) 0.61 - 1.24 mg/dL   Calcium 9.8 8.9 - 10.3 mg/dL   GFR, Estimated 57 (L) >60 mL/min   Anion gap 7 5 - 15  Magnesium  Result Value Ref Range   Magnesium 2.0 1.7 - 2.4 mg/dL  CBC with Differential/Platelet  Result Value Ref Range   WBC 8.0 4.0 - 10.5 K/uL   RBC 3.92 (L) 4.22 - 5.81 MIL/uL   Hemoglobin 12.1 (L) 13.0 - 17.0 g/dL   HCT 35.7 (L) 39.0 - 52.0 %   MCV 91.1 80.0 - 100.0 fL   MCH 30.9 26.0 - 34.0 pg   MCHC 33.9 30.0 - 36.0 g/dL   RDW 13.2 11.5 - 15.5 %   Platelets 183 150 - 400 K/uL   nRBC 0.0 0.0 - 0.2 %   Neutrophils Relative % 64 %   Neutro Abs 5.2 1.7 - 7.7 K/uL   Lymphocytes Relative 22 %   Lymphs Abs 1.7 0.7 - 4.0 K/uL   Monocytes  Relative 10 %   Monocytes Absolute 0.8 0.1 - 1.0 K/uL   Eosinophils Relative 2 %   Eosinophils Absolute 0.2 0.0 - 0.5 K/uL   Basophils Relative 1 %   Basophils Absolute 0.1 0.0 - 0.1 K/uL   Immature Granulocytes 1 %   Abs Immature Granulocytes 0.05 0.00 - 0.07 K/uL  Brain natriuretic peptide  Result Value Ref Range   B Natriuretic Peptide 85.4 0.0 - 100.0 pg/mL  Glucose,  capillary  Result Value Ref Range   Glucose-Capillary 132 (H) 70 - 99 mg/dL  Glucose, capillary  Result Value Ref Range   Glucose-Capillary 158 (H) 70 - 99 mg/dL  Hemoglobin A1c  Result Value Ref Range   Hgb A1c MFr Bld 7.8 (H) 4.8 - 5.6 %   Mean Plasma Glucose 177.16 mg/dL  Lipid panel  Result Value Ref Range   Cholesterol 79 0 - 200 mg/dL   Triglycerides 154 (H) <150 mg/dL   HDL 29 (L) >40 mg/dL   Total CHOL/HDL Ratio 2.7 RATIO   VLDL 31 0 - 40 mg/dL   LDL Cholesterol 19 0 - 99 mg/dL  Glucose, capillary  Result Value Ref Range   Glucose-Capillary 189 (H) 70 - 99 mg/dL  Glucose, capillary  Result Value Ref Range   Glucose-Capillary 133 (H) 70 - 99 mg/dL  Basic metabolic panel  Result Value Ref Range   Sodium 135 135 - 145 mmol/L   Potassium 3.9 3.5 - 5.1 mmol/L   Chloride 101 98 - 111 mmol/L   CO2 27 22 - 32 mmol/L   Glucose, Bld 173 (H) 70 - 99 mg/dL   BUN 27 (H) 8 - 23 mg/dL   Creatinine, Ser 0.92 0.61 - 1.24 mg/dL   Calcium 9.9 8.9 - 10.3 mg/dL   GFR, Estimated >60 >60 mL/min   Anion gap 7 5 - 15  CBC with Differential/Platelet  Result Value Ref Range   WBC 7.7 4.0 - 10.5 K/uL   RBC 3.95 (L) 4.22 - 5.81 MIL/uL   Hemoglobin 12.2 (L) 13.0 - 17.0 g/dL   HCT 35.9 (L) 39.0 - 52.0 %   MCV 90.9 80.0 - 100.0 fL   MCH 30.9 26.0 - 34.0 pg   MCHC 34.0 30.0 - 36.0 g/dL   RDW 13.1 11.5 - 15.5 %   Platelets 196 150 - 400 K/uL   nRBC 0.0 0.0 - 0.2 %   Neutrophils Relative % 66 %   Neutro Abs 5.1 1.7 - 7.7 K/uL   Lymphocytes Relative 19 %   Lymphs Abs 1.5 0.7 - 4.0 K/uL   Monocytes Relative 11 %    Monocytes Absolute 0.8 0.1 - 1.0 K/uL   Eosinophils Relative 2 %   Eosinophils Absolute 0.2 0.0 - 0.5 K/uL   Basophils Relative 1 %   Basophils Absolute 0.0 0.0 - 0.1 K/uL   Immature Granulocytes 1 %   Abs Immature Granulocytes 0.05 0.00 - 0.07 K/uL  Magnesium  Result Value Ref Range   Magnesium 2.0 1.7 - 2.4 mg/dL  Glucose, capillary  Result Value Ref Range   Glucose-Capillary 117 (H) 70 - 99 mg/dL  Glucose, capillary  Result Value Ref Range   Glucose-Capillary 192 (H) 70 - 99 mg/dL  POCT Activated clotting time  Result Value Ref Range   Activated Clotting Time 303 seconds  ECHOCARDIOGRAM COMPLETE  Result Value Ref Range   Weight 4,134.4 oz   Height 70 in   BP 154/82 mmHg   Ao pk vel 1.38 m/s   AV Area VTI 2.91 cm2   AR max vel 2.66 cm2   AV Mean grad 4.0 mmHg   AV Peak grad 7.7 mmHg   S' Lateral 2.87 cm   AV Area mean vel 2.79 cm2   Area-P 1/2 2.99 cm2  Troponin I (High Sensitivity)  Result Value Ref Range   Troponin I (High Sensitivity) >27,000 (HH) <18 ng/L  Troponin I (High Sensitivity)  Result Value Ref Range  Troponin I (High Sensitivity) >27,000 (HH) <18 ng/L      Assessment & Plan:   1. NSTEMI with trop 27000 s/p stent.  Is on ASA and plavix for such to fu with cardiology for such.   Guideline-Directed Medical Therapy for prevention of LV systolic dysfunction post MI               Beta Blocker - coreg imdur               ACEi  -olmesartan 40 mg daily               Statin: crestor 40 mg daily               Antiplatelet Therapy:  ASA 81mg                           On lasix 20 mg q daily.   Follow up:   2. Hematuria from cath related injury : Will conitnue flomax ,Will need to fu with urology asap.  Has some burning currently.    3. COPD : home o2 asap. Refer to home.  Started pt on spiriva last visit.  Stable, needs home o2.   4. Acute embolic stroke : Brain MRI - with multiple microinfarcts consistent embolic stroke.  Pt to have an event  monitor for such.  5. DM : recheck labs. On lantus 25 untis , metformin 1000 mg bid   Problem List Items Addressed This Visit   None      Follow up plan: No follow-ups on file.

## 2020-05-24 LAB — URINALYSIS, ROUTINE W REFLEX MICROSCOPIC
Bilirubin, UA: NEGATIVE
Glucose, UA: NEGATIVE
Ketones, UA: NEGATIVE
Leukocytes,UA: NEGATIVE
Nitrite, UA: NEGATIVE
Specific Gravity, UA: 1.015 (ref 1.005–1.030)
Urobilinogen, Ur: 0.2 mg/dL (ref 0.2–1.0)
pH, UA: 7 (ref 5.0–7.5)

## 2020-05-24 LAB — MICROSCOPIC EXAMINATION
Bacteria, UA: NONE SEEN
Epithelial Cells (non renal): NONE SEEN /hpf (ref 0–10)
WBC, UA: NONE SEEN /hpf (ref 0–5)

## 2020-05-25 LAB — URINE CULTURE

## 2020-05-27 ENCOUNTER — Other Ambulatory Visit: Payer: Self-pay | Admitting: Internal Medicine

## 2020-05-27 DIAGNOSIS — J209 Acute bronchitis, unspecified: Secondary | ICD-10-CM

## 2020-05-27 DIAGNOSIS — J440 Chronic obstructive pulmonary disease with acute lower respiratory infection: Secondary | ICD-10-CM

## 2020-05-27 NOTE — Telephone Encounter (Signed)
I don't have a referral for oxygen. Please advise

## 2020-05-27 NOTE — Telephone Encounter (Signed)
Step daughter called to check on the status of a referral for Pt to receive oxygen / please advise pt asap

## 2020-05-27 NOTE — Progress Notes (Unsigned)
   There were no vitals taken for this visit.   Subjective:    Patient ID: Justin Griffith, male    DOB: 09/13/1940, 80 y.o.   MRN: [id number]  HPI: Justin Griffith is a 80 y.o. male  HPI  No chief complaint on file.   Relevant past medical, surgical, family and social history reviewed and updated as indicated. Interim medical history since our last visit reviewed. Allergies and medications reviewed and updated.  Review of Systems  Per HPI unless specifically indicated above     Objective:    There were no vitals taken for this visit.  Wt Readings from Last 3 Encounters:  05/23/20 260 lb (117.9 kg)  05/21/20 260 lb 6.4 oz (118.1 kg)  05/15/20 268 lb (121.6 kg)    Physical Exam  Results for orders placed or performed in visit on 05/23/20  Urine Culture   Specimen: Urine   UR  Result Value Ref Range   Urine Culture, Routine Final report    Organism ID, Bacteria Comment   Microscopic Examination   Urine  Result Value Ref Range   WBC, UA None seen 0 - 5 /hpf   RBC 0-2 0 - 2 /hpf   Epithelial Cells (non renal) None seen 0 - 10 /hpf   Bacteria, UA None seen None seen/Few  Urinalysis, Routine w reflex microscopic  Result Value Ref Range   Specific Gravity, UA 1.015 1.005 - 1.030   pH, UA 7.0 5.0 - 7.5   Color, UA Yellow Yellow   Appearance Ur Clear Clear   Leukocytes,UA Negative Negative   Protein,UA 3+ (A) Negative/Trace   Glucose, UA Negative Negative   Ketones, UA Negative Negative   RBC, UA 2+ (A) Negative   Bilirubin, UA Negative Negative   Urobilinogen, Ur 0.2 0.2 - 1.0 mg/dL   Nitrite, UA Negative Negative   Microscopic Examination See below:       Assessment & Plan:   Problem List Items Addressed This Visit   None   Visit Diagnoses    Acute bronchitis with COPD ([place])    -  Primary   Relevant Orders   For home use only DME oxygen       Follow up plan: No follow-ups on file.  Health Maintenance :***  Mammogram/ Paps smear: DEXA: PSA :   Cscope : Pneumonia vaccine :{pneumonia vaccine:25278} FLu vaccine :

## 2020-05-28 DIAGNOSIS — R079 Chest pain, unspecified: Secondary | ICD-10-CM | POA: Diagnosis not present

## 2020-05-28 DIAGNOSIS — G4733 Obstructive sleep apnea (adult) (pediatric): Secondary | ICD-10-CM | POA: Diagnosis not present

## 2020-05-28 DIAGNOSIS — I639 Cerebral infarction, unspecified: Secondary | ICD-10-CM | POA: Diagnosis not present

## 2020-05-28 DIAGNOSIS — Z6841 Body Mass Index (BMI) 40.0 and over, adult: Secondary | ICD-10-CM | POA: Diagnosis not present

## 2020-05-28 DIAGNOSIS — Z9189 Other specified personal risk factors, not elsewhere classified: Secondary | ICD-10-CM | POA: Diagnosis not present

## 2020-05-28 DIAGNOSIS — I509 Heart failure, unspecified: Secondary | ICD-10-CM | POA: Diagnosis not present

## 2020-05-28 DIAGNOSIS — R0602 Shortness of breath: Secondary | ICD-10-CM | POA: Diagnosis not present

## 2020-05-28 DIAGNOSIS — I429 Cardiomyopathy, unspecified: Secondary | ICD-10-CM | POA: Diagnosis not present

## 2020-05-30 ENCOUNTER — Encounter (INDEPENDENT_AMBULATORY_CARE_PROVIDER_SITE_OTHER): Payer: Medicare Other

## 2020-05-30 ENCOUNTER — Ambulatory Visit (INDEPENDENT_AMBULATORY_CARE_PROVIDER_SITE_OTHER): Payer: Medicare Other | Admitting: Vascular Surgery

## 2020-05-31 DIAGNOSIS — Z955 Presence of coronary angioplasty implant and graft: Secondary | ICD-10-CM | POA: Diagnosis not present

## 2020-05-31 DIAGNOSIS — G4733 Obstructive sleep apnea (adult) (pediatric): Secondary | ICD-10-CM | POA: Diagnosis not present

## 2020-05-31 DIAGNOSIS — I10 Essential (primary) hypertension: Secondary | ICD-10-CM | POA: Diagnosis not present

## 2020-05-31 DIAGNOSIS — E1151 Type 2 diabetes mellitus with diabetic peripheral angiopathy without gangrene: Secondary | ICD-10-CM | POA: Diagnosis not present

## 2020-05-31 DIAGNOSIS — I2510 Atherosclerotic heart disease of native coronary artery without angina pectoris: Secondary | ICD-10-CM | POA: Diagnosis not present

## 2020-05-31 DIAGNOSIS — Z87440 Personal history of urinary (tract) infections: Secondary | ICD-10-CM | POA: Diagnosis not present

## 2020-05-31 DIAGNOSIS — E669 Obesity, unspecified: Secondary | ICD-10-CM | POA: Diagnosis not present

## 2020-05-31 DIAGNOSIS — I214 Non-ST elevation (NSTEMI) myocardial infarction: Secondary | ICD-10-CM | POA: Diagnosis not present

## 2020-05-31 DIAGNOSIS — M1990 Unspecified osteoarthritis, unspecified site: Secondary | ICD-10-CM | POA: Diagnosis not present

## 2020-05-31 DIAGNOSIS — N400 Enlarged prostate without lower urinary tract symptoms: Secondary | ICD-10-CM | POA: Diagnosis not present

## 2020-05-31 DIAGNOSIS — E1169 Type 2 diabetes mellitus with other specified complication: Secondary | ICD-10-CM | POA: Diagnosis not present

## 2020-05-31 DIAGNOSIS — Z6836 Body mass index (BMI) 36.0-36.9, adult: Secondary | ICD-10-CM | POA: Diagnosis not present

## 2020-05-31 DIAGNOSIS — E1140 Type 2 diabetes mellitus with diabetic neuropathy, unspecified: Secondary | ICD-10-CM | POA: Diagnosis not present

## 2020-05-31 DIAGNOSIS — J449 Chronic obstructive pulmonary disease, unspecified: Secondary | ICD-10-CM | POA: Diagnosis not present

## 2020-05-31 DIAGNOSIS — Z794 Long term (current) use of insulin: Secondary | ICD-10-CM | POA: Diagnosis not present

## 2020-05-31 DIAGNOSIS — E1165 Type 2 diabetes mellitus with hyperglycemia: Secondary | ICD-10-CM | POA: Diagnosis not present

## 2020-05-31 DIAGNOSIS — E1159 Type 2 diabetes mellitus with other circulatory complications: Secondary | ICD-10-CM | POA: Diagnosis not present

## 2020-05-31 DIAGNOSIS — Z8673 Personal history of transient ischemic attack (TIA), and cerebral infarction without residual deficits: Secondary | ICD-10-CM | POA: Diagnosis not present

## 2020-05-31 DIAGNOSIS — Z791 Long term (current) use of non-steroidal anti-inflammatories (NSAID): Secondary | ICD-10-CM | POA: Diagnosis not present

## 2020-05-31 DIAGNOSIS — F0390 Unspecified dementia without behavioral disturbance: Secondary | ICD-10-CM | POA: Diagnosis not present

## 2020-05-31 DIAGNOSIS — M549 Dorsalgia, unspecified: Secondary | ICD-10-CM | POA: Diagnosis not present

## 2020-05-31 DIAGNOSIS — F17200 Nicotine dependence, unspecified, uncomplicated: Secondary | ICD-10-CM | POA: Diagnosis not present

## 2020-05-31 DIAGNOSIS — G8929 Other chronic pain: Secondary | ICD-10-CM | POA: Diagnosis not present

## 2020-05-31 DIAGNOSIS — R809 Proteinuria, unspecified: Secondary | ICD-10-CM | POA: Diagnosis not present

## 2020-06-03 ENCOUNTER — Telehealth: Payer: Self-pay | Admitting: Urology

## 2020-06-03 NOTE — Telephone Encounter (Signed)
I spoke with pt about scheduling a follow up appt.  Pt stated he is doing fine and isn't have anymore blood in urine.  He will call back if he needs to come in for a visit.

## 2020-06-04 ENCOUNTER — Encounter: Payer: Medicare Other | Attending: Internal Medicine | Admitting: *Deleted

## 2020-06-04 ENCOUNTER — Encounter: Payer: Self-pay | Admitting: *Deleted

## 2020-06-04 ENCOUNTER — Other Ambulatory Visit: Payer: Self-pay

## 2020-06-04 DIAGNOSIS — Z955 Presence of coronary angioplasty implant and graft: Secondary | ICD-10-CM

## 2020-06-04 DIAGNOSIS — I214 Non-ST elevation (NSTEMI) myocardial infarction: Secondary | ICD-10-CM

## 2020-06-04 NOTE — Progress Notes (Signed)
Virtual orientation call completed today. He does not have an appointment on  for EP eval and gym Orientation since he is receiving [place].  Will check in a few weeks to see when [facility] PT will be done Documentation of diagnosis can be found in [facility] 05/16/18

## 2020-06-05 ENCOUNTER — Ambulatory Visit: Payer: Medicare Other | Admitting: Urology

## 2020-06-05 DIAGNOSIS — I214 Non-ST elevation (NSTEMI) myocardial infarction: Secondary | ICD-10-CM | POA: Diagnosis not present

## 2020-06-05 DIAGNOSIS — Z955 Presence of coronary angioplasty implant and graft: Secondary | ICD-10-CM | POA: Diagnosis not present

## 2020-06-05 DIAGNOSIS — E1165 Type 2 diabetes mellitus with hyperglycemia: Secondary | ICD-10-CM | POA: Diagnosis not present

## 2020-06-05 DIAGNOSIS — I10 Essential (primary) hypertension: Secondary | ICD-10-CM | POA: Diagnosis not present

## 2020-06-05 DIAGNOSIS — E1159 Type 2 diabetes mellitus with other circulatory complications: Secondary | ICD-10-CM | POA: Diagnosis not present

## 2020-06-05 DIAGNOSIS — I2510 Atherosclerotic heart disease of native coronary artery without angina pectoris: Secondary | ICD-10-CM | POA: Diagnosis not present

## 2020-06-07 ENCOUNTER — Other Ambulatory Visit: Payer: Self-pay

## 2020-06-07 ENCOUNTER — Ambulatory Visit (INDEPENDENT_AMBULATORY_CARE_PROVIDER_SITE_OTHER): Payer: Medicare Other | Admitting: Internal Medicine

## 2020-06-07 VITALS — BP 129/75 | HR 75 | Temp 97.4°F | Wt 258.6 lb

## 2020-06-07 DIAGNOSIS — Z125 Encounter for screening for malignant neoplasm of prostate: Secondary | ICD-10-CM | POA: Diagnosis not present

## 2020-06-07 DIAGNOSIS — I152 Hypertension secondary to endocrine disorders: Secondary | ICD-10-CM | POA: Diagnosis not present

## 2020-06-07 DIAGNOSIS — E1159 Type 2 diabetes mellitus with other circulatory complications: Secondary | ICD-10-CM

## 2020-06-07 MED ORDER — FLUTICASONE-SALMETEROL 100-50 MCG/DOSE IN AEPB: 1.0000 | INHALATION_SPRAY | Freq: Two times a day (BID) | RESPIRATORY_TRACT | 3 refills | Status: DC

## 2020-06-07 NOTE — Progress Notes (Signed)
BP 129/75   Pulse 75   Temp (!) 97.4 F (36.3 C) (Oral)   Wt 258 lb 9.6 oz (117.3 kg)   SpO2 93%   BMI 37.11 kg/m    Subjective:    Patient ID: Justin Griffith, male    DOB: 12/16/1940, 80 y.o.   MRN: [id number]  HPI: Justin Griffith is a 80 y.o. male  COPD He complains of shortness of breath and wheezing. There is no chest tightness, cough, difficulty breathing, frequent throat clearing, hemoptysis, hoarse voice or sputum production. This is a chronic problem. The current episode started more than 1 year ago. Associated symptoms include dyspnea on exertion. Pertinent negatives include no appetite change, chest pain, ear congestion, heartburn, malaise/fatigue, myalgias, nasal congestion, PND, rhinorrhea, sore throat or sweats. His past medical history is significant for COPD and emphysema.  Diabetes He presents for his follow-up diabetic visit. He has type 2 diabetes mellitus. Pertinent negatives for hypoglycemia include no sweats. Pertinent negatives for diabetes include no blurred vision, no chest pain, no fatigue, no foot ulcerations, no polydipsia, no polyphagia, no visual change and no weakness.    Chief Complaint  Patient presents with  . NSTEMI f/u  . COPD    Patient states that his breathing is not much better.     Relevant past medical, surgical, family and social history reviewed and updated as indicated. Interim medical history since our last visit reviewed. Allergies and medications reviewed and updated.  Review of Systems  Constitutional: Negative for appetite change, fatigue and malaise/fatigue.  HENT: Negative for hoarse voice, rhinorrhea and sore throat.   Eyes: Negative for blurred vision.  Respiratory: Positive for shortness of breath and wheezing. Negative for cough, hemoptysis and sputum production.   Cardiovascular: Positive for dyspnea on exertion. Negative for chest pain and PND.  Gastrointestinal: Negative for heartburn.  Endocrine: Negative for  polydipsia and polyphagia.  Musculoskeletal: Negative for myalgias.  Neurological: Negative for weakness.    Per HPI unless specifically indicated above     Objective:    BP 129/75   Pulse 75   Temp (!) 97.4 F (36.3 C) (Oral)   Wt 258 lb 9.6 oz (117.3 kg)   SpO2 93%   BMI 37.11 kg/m   Wt Readings from Last 3 Encounters:  06/07/20 258 lb 9.6 oz (117.3 kg)  05/23/20 260 lb (117.9 kg)  05/21/20 260 lb 6.4 oz (118.1 kg)    Physical Exam Vitals and nursing note reviewed.  Constitutional:      General: He is not in acute distress.    Appearance: Normal appearance. He is not ill-appearing or diaphoretic.  HENT:     Head: Normocephalic and atraumatic.     Right Ear: Tympanic membrane and external ear normal. There is no impacted cerumen.     Left Ear: External ear normal.     Nose: No congestion or rhinorrhea.     Mouth/Throat:     Pharynx: No oropharyngeal exudate or posterior oropharyngeal erythema.  Eyes:     Conjunctiva/sclera: Conjunctivae normal.     Pupils: Pupils are equal, round, and reactive to light.  Cardiovascular:     Rate and Rhythm: Normal rate and regular rhythm.     Heart sounds: No murmur heard. No friction rub. No gallop.   Pulmonary:     Effort: No respiratory distress.     Breath sounds: No wheezing.  Abdominal:     General: Abdomen is flat. Bowel sounds are normal. There is no  distension.     Palpations: Abdomen is soft. There is no mass.     Tenderness: There is no abdominal tenderness.  Musculoskeletal:        General: No swelling or deformity.     Cervical back: Normal range of motion and neck supple. No rigidity or tenderness.     Right lower leg: No edema.     Left lower leg: No edema.  Skin:    General: Skin is warm.     Coloration: Skin is not jaundiced.     Findings: No erythema.  Neurological:     Mental Status: He is alert and oriented to person, place, and time. Mental status is at baseline.  Psychiatric:        Mood and Affect:  Mood normal.        Behavior: Behavior normal.     Results for orders placed or performed in visit on 05/23/20  Urine Culture   Specimen: Urine   UR  Result Value Ref Range   Urine Culture, Routine Final report    Organism ID, Bacteria Comment   Microscopic Examination   Urine  Result Value Ref Range   WBC, UA None seen 0 - 5 /hpf   RBC 0-2 0 - 2 /hpf   Epithelial Cells (non renal) None seen 0 - 10 /hpf   Bacteria, UA None seen None seen/Few  Urinalysis, Routine w reflex microscopic  Result Value Ref Range   Specific Gravity, UA 1.015 1.005 - 1.030   pH, UA 7.0 5.0 - 7.5   Color, UA Yellow Yellow   Appearance Ur Clear Clear   Leukocytes,UA Negative Negative   Protein,UA 3+ (A) Negative/Trace   Glucose, UA Negative Negative   Ketones, UA Negative Negative   RBC, UA 2+ (A) Negative   Bilirubin, UA Negative Negative   Urobilinogen, Ur 0.2 0.2 - 1.0 mg/dL   Nitrite, UA Negative Negative   Microscopic Examination See below:       Assessment & Plan:  1. STEMI with Stenting to LAD : ia on entresto per CHF clinic. Continue Plavix and coreg   2.CHF: sec to CAD  Fu and mx per cards   3. DM : recently started on farxiga  check HbA1c,  urine  microalbumin  diabetic diet plan given to pt  adviced regarding hypoglycemia and instructions given to pt today on how to prevent and treat the same if it were to occur. pt acknowledges the plan and voices understanding of the same.  exercise plan given and encouraged.   advice diabetic yearly podiatry, ophthalmology , nutritionist , dental check q 6 months,  4.COPD : Is on spiriva , Add advair 2 puffs bid for such  Patient displays the symptoms of COPD: yes   No flowsheet data found.   Problem List Items Addressed This Visit      Cardiovascular and Mediastinum   Hypertension associated with diabetes ([place]) - Primary   Relevant Medications   hydrochlorothiazide (HYDRODIURIL) 12.5 MG tablet   sacubitril-valsartan (ENTRESTO) 49-51 MG    Other Relevant Orders   Comprehensive metabolic panel   CBC with Differential/Platelet   Thyroid Panel With TSH   Bayer DCA Hb A1c Waived   Lipid panel   Microalbumin, Urine Waived    Other Visit Diagnoses    Screening for prostate cancer       Relevant Orders   PSA Total+%Free (Serial)       Follow up plan: Return in about 6 weeks (around  07/19/2020). 

## 2020-06-10 ENCOUNTER — Encounter: Payer: Self-pay | Admitting: Internal Medicine

## 2020-06-10 DIAGNOSIS — Z955 Presence of coronary angioplasty implant and graft: Secondary | ICD-10-CM | POA: Diagnosis not present

## 2020-06-10 DIAGNOSIS — I2510 Atherosclerotic heart disease of native coronary artery without angina pectoris: Secondary | ICD-10-CM | POA: Diagnosis not present

## 2020-06-10 DIAGNOSIS — I214 Non-ST elevation (NSTEMI) myocardial infarction: Secondary | ICD-10-CM | POA: Diagnosis not present

## 2020-06-10 DIAGNOSIS — E1165 Type 2 diabetes mellitus with hyperglycemia: Secondary | ICD-10-CM | POA: Diagnosis not present

## 2020-06-10 DIAGNOSIS — I10 Essential (primary) hypertension: Secondary | ICD-10-CM | POA: Diagnosis not present

## 2020-06-10 DIAGNOSIS — E1159 Type 2 diabetes mellitus with other circulatory complications: Secondary | ICD-10-CM | POA: Diagnosis not present

## 2020-06-11 DIAGNOSIS — I252 Old myocardial infarction: Secondary | ICD-10-CM | POA: Diagnosis not present

## 2020-06-11 DIAGNOSIS — E1142 Type 2 diabetes mellitus with diabetic polyneuropathy: Secondary | ICD-10-CM | POA: Diagnosis not present

## 2020-06-11 DIAGNOSIS — E782 Mixed hyperlipidemia: Secondary | ICD-10-CM | POA: Diagnosis not present

## 2020-06-11 DIAGNOSIS — I10 Essential (primary) hypertension: Secondary | ICD-10-CM | POA: Diagnosis not present

## 2020-06-11 DIAGNOSIS — E669 Obesity, unspecified: Secondary | ICD-10-CM | POA: Diagnosis not present

## 2020-06-11 DIAGNOSIS — I509 Heart failure, unspecified: Secondary | ICD-10-CM | POA: Diagnosis not present

## 2020-06-11 DIAGNOSIS — Z955 Presence of coronary angioplasty implant and graft: Secondary | ICD-10-CM | POA: Diagnosis not present

## 2020-06-11 DIAGNOSIS — I639 Cerebral infarction, unspecified: Secondary | ICD-10-CM | POA: Diagnosis not present

## 2020-06-11 DIAGNOSIS — E1169 Type 2 diabetes mellitus with other specified complication: Secondary | ICD-10-CM | POA: Diagnosis not present

## 2020-06-11 DIAGNOSIS — R0602 Shortness of breath: Secondary | ICD-10-CM | POA: Diagnosis not present

## 2020-06-11 DIAGNOSIS — G4733 Obstructive sleep apnea (adult) (pediatric): Secondary | ICD-10-CM | POA: Diagnosis not present

## 2020-06-11 DIAGNOSIS — I429 Cardiomyopathy, unspecified: Secondary | ICD-10-CM | POA: Diagnosis not present

## 2020-06-12 DIAGNOSIS — I700 Atherosclerosis of aorta: Secondary | ICD-10-CM | POA: Insufficient documentation

## 2020-06-17 DIAGNOSIS — E1159 Type 2 diabetes mellitus with other circulatory complications: Secondary | ICD-10-CM | POA: Diagnosis not present

## 2020-06-17 DIAGNOSIS — I2510 Atherosclerotic heart disease of native coronary artery without angina pectoris: Secondary | ICD-10-CM | POA: Diagnosis not present

## 2020-06-17 DIAGNOSIS — I10 Essential (primary) hypertension: Secondary | ICD-10-CM | POA: Diagnosis not present

## 2020-06-17 DIAGNOSIS — I214 Non-ST elevation (NSTEMI) myocardial infarction: Secondary | ICD-10-CM | POA: Diagnosis not present

## 2020-06-17 DIAGNOSIS — Z955 Presence of coronary angioplasty implant and graft: Secondary | ICD-10-CM | POA: Diagnosis not present

## 2020-06-17 DIAGNOSIS — E1165 Type 2 diabetes mellitus with hyperglycemia: Secondary | ICD-10-CM | POA: Diagnosis not present

## 2020-06-21 DIAGNOSIS — E1159 Type 2 diabetes mellitus with other circulatory complications: Secondary | ICD-10-CM | POA: Diagnosis not present

## 2020-06-21 DIAGNOSIS — I214 Non-ST elevation (NSTEMI) myocardial infarction: Secondary | ICD-10-CM | POA: Diagnosis not present

## 2020-06-21 DIAGNOSIS — E1165 Type 2 diabetes mellitus with hyperglycemia: Secondary | ICD-10-CM | POA: Diagnosis not present

## 2020-06-21 DIAGNOSIS — I10 Essential (primary) hypertension: Secondary | ICD-10-CM | POA: Diagnosis not present

## 2020-06-21 DIAGNOSIS — Z955 Presence of coronary angioplasty implant and graft: Secondary | ICD-10-CM | POA: Diagnosis not present

## 2020-06-21 DIAGNOSIS — I2510 Atherosclerotic heart disease of native coronary artery without angina pectoris: Secondary | ICD-10-CM | POA: Diagnosis not present

## 2020-06-25 DIAGNOSIS — I214 Non-ST elevation (NSTEMI) myocardial infarction: Secondary | ICD-10-CM | POA: Diagnosis not present

## 2020-06-25 DIAGNOSIS — E1165 Type 2 diabetes mellitus with hyperglycemia: Secondary | ICD-10-CM | POA: Diagnosis not present

## 2020-06-25 DIAGNOSIS — Z955 Presence of coronary angioplasty implant and graft: Secondary | ICD-10-CM | POA: Diagnosis not present

## 2020-06-25 DIAGNOSIS — E1159 Type 2 diabetes mellitus with other circulatory complications: Secondary | ICD-10-CM | POA: Diagnosis not present

## 2020-06-25 DIAGNOSIS — I2510 Atherosclerotic heart disease of native coronary artery without angina pectoris: Secondary | ICD-10-CM | POA: Diagnosis not present

## 2020-06-25 DIAGNOSIS — I10 Essential (primary) hypertension: Secondary | ICD-10-CM | POA: Diagnosis not present

## 2020-06-26 DIAGNOSIS — I509 Heart failure, unspecified: Secondary | ICD-10-CM | POA: Diagnosis not present

## 2020-06-26 DIAGNOSIS — J449 Chronic obstructive pulmonary disease, unspecified: Secondary | ICD-10-CM | POA: Diagnosis not present

## 2020-06-26 DIAGNOSIS — R0602 Shortness of breath: Secondary | ICD-10-CM | POA: Diagnosis not present

## 2020-06-26 DIAGNOSIS — E669 Obesity, unspecified: Secondary | ICD-10-CM | POA: Diagnosis not present

## 2020-06-26 DIAGNOSIS — E782 Mixed hyperlipidemia: Secondary | ICD-10-CM | POA: Diagnosis not present

## 2020-06-26 DIAGNOSIS — E1169 Type 2 diabetes mellitus with other specified complication: Secondary | ICD-10-CM | POA: Diagnosis not present

## 2020-06-26 DIAGNOSIS — I429 Cardiomyopathy, unspecified: Secondary | ICD-10-CM | POA: Diagnosis not present

## 2020-06-26 DIAGNOSIS — G4733 Obstructive sleep apnea (adult) (pediatric): Secondary | ICD-10-CM | POA: Diagnosis not present

## 2020-06-26 DIAGNOSIS — I252 Old myocardial infarction: Secondary | ICD-10-CM | POA: Diagnosis not present

## 2020-06-26 DIAGNOSIS — I10 Essential (primary) hypertension: Secondary | ICD-10-CM | POA: Diagnosis not present

## 2020-06-26 DIAGNOSIS — I639 Cerebral infarction, unspecified: Secondary | ICD-10-CM | POA: Diagnosis not present

## 2020-06-26 DIAGNOSIS — E1142 Type 2 diabetes mellitus with diabetic polyneuropathy: Secondary | ICD-10-CM | POA: Diagnosis not present

## 2020-06-27 NOTE — Telephone Encounter (Signed)
Please reschedule RN CM at [facility]  

## 2020-06-27 NOTE — Telephone Encounter (Signed)
Patient has been rescheduled  on 07/30/2020

## 2020-06-28 ENCOUNTER — Telehealth: Payer: Self-pay | Admitting: Internal Medicine

## 2020-06-28 DIAGNOSIS — I2510 Atherosclerotic heart disease of native coronary artery without angina pectoris: Secondary | ICD-10-CM | POA: Diagnosis not present

## 2020-06-28 DIAGNOSIS — E1165 Type 2 diabetes mellitus with hyperglycemia: Secondary | ICD-10-CM | POA: Diagnosis not present

## 2020-06-28 DIAGNOSIS — I214 Non-ST elevation (NSTEMI) myocardial infarction: Secondary | ICD-10-CM | POA: Diagnosis not present

## 2020-06-28 DIAGNOSIS — E1159 Type 2 diabetes mellitus with other circulatory complications: Secondary | ICD-10-CM | POA: Diagnosis not present

## 2020-06-28 DIAGNOSIS — Z955 Presence of coronary angioplasty implant and graft: Secondary | ICD-10-CM | POA: Diagnosis not present

## 2020-06-28 DIAGNOSIS — I10 Essential (primary) hypertension: Secondary | ICD-10-CM | POA: Diagnosis not present

## 2020-06-28 NOTE — Telephone Encounter (Deleted)
viig

## 2020-06-28 NOTE — Telephone Encounter (Signed)
Called to request verbal order to continue PT - 1xwk 4.  Also wanted to report that the patient had a fall las Wednesday in a restaurant restroom.  Patient was helped up by customers.   No injury reported, patient said he hit his head.   Also wanted to order a quad cane for patient as well.  Any questions, please call at [phone]

## 2020-06-30 DIAGNOSIS — M1990 Unspecified osteoarthritis, unspecified site: Secondary | ICD-10-CM | POA: Diagnosis not present

## 2020-06-30 DIAGNOSIS — I214 Non-ST elevation (NSTEMI) myocardial infarction: Secondary | ICD-10-CM | POA: Diagnosis not present

## 2020-06-30 DIAGNOSIS — E1151 Type 2 diabetes mellitus with diabetic peripheral angiopathy without gangrene: Secondary | ICD-10-CM | POA: Diagnosis not present

## 2020-06-30 DIAGNOSIS — Z794 Long term (current) use of insulin: Secondary | ICD-10-CM | POA: Diagnosis not present

## 2020-06-30 DIAGNOSIS — Z955 Presence of coronary angioplasty implant and graft: Secondary | ICD-10-CM | POA: Diagnosis not present

## 2020-06-30 DIAGNOSIS — I10 Essential (primary) hypertension: Secondary | ICD-10-CM | POA: Diagnosis not present

## 2020-06-30 DIAGNOSIS — J449 Chronic obstructive pulmonary disease, unspecified: Secondary | ICD-10-CM | POA: Diagnosis not present

## 2020-06-30 DIAGNOSIS — I252 Old myocardial infarction: Secondary | ICD-10-CM | POA: Diagnosis not present

## 2020-06-30 DIAGNOSIS — Z791 Long term (current) use of non-steroidal anti-inflammatories (NSAID): Secondary | ICD-10-CM | POA: Diagnosis not present

## 2020-06-30 DIAGNOSIS — Z6836 Body mass index (BMI) 36.0-36.9, adult: Secondary | ICD-10-CM | POA: Diagnosis not present

## 2020-06-30 DIAGNOSIS — N400 Enlarged prostate without lower urinary tract symptoms: Secondary | ICD-10-CM | POA: Diagnosis not present

## 2020-06-30 DIAGNOSIS — G8929 Other chronic pain: Secondary | ICD-10-CM | POA: Diagnosis not present

## 2020-06-30 DIAGNOSIS — Z8673 Personal history of transient ischemic attack (TIA), and cerebral infarction without residual deficits: Secondary | ICD-10-CM | POA: Diagnosis not present

## 2020-06-30 DIAGNOSIS — Z87440 Personal history of urinary (tract) infections: Secondary | ICD-10-CM | POA: Diagnosis not present

## 2020-06-30 DIAGNOSIS — M549 Dorsalgia, unspecified: Secondary | ICD-10-CM | POA: Diagnosis not present

## 2020-06-30 DIAGNOSIS — E1169 Type 2 diabetes mellitus with other specified complication: Secondary | ICD-10-CM | POA: Diagnosis not present

## 2020-06-30 DIAGNOSIS — G4733 Obstructive sleep apnea (adult) (pediatric): Secondary | ICD-10-CM | POA: Diagnosis not present

## 2020-06-30 DIAGNOSIS — F0390 Unspecified dementia without behavioral disturbance: Secondary | ICD-10-CM | POA: Diagnosis not present

## 2020-06-30 DIAGNOSIS — I2510 Atherosclerotic heart disease of native coronary artery without angina pectoris: Secondary | ICD-10-CM | POA: Diagnosis not present

## 2020-06-30 DIAGNOSIS — E1140 Type 2 diabetes mellitus with diabetic neuropathy, unspecified: Secondary | ICD-10-CM | POA: Diagnosis not present

## 2020-06-30 DIAGNOSIS — F17200 Nicotine dependence, unspecified, uncomplicated: Secondary | ICD-10-CM | POA: Diagnosis not present

## 2020-06-30 DIAGNOSIS — E1159 Type 2 diabetes mellitus with other circulatory complications: Secondary | ICD-10-CM | POA: Diagnosis not present

## 2020-06-30 DIAGNOSIS — E669 Obesity, unspecified: Secondary | ICD-10-CM | POA: Diagnosis not present

## 2020-06-30 DIAGNOSIS — R809 Proteinuria, unspecified: Secondary | ICD-10-CM | POA: Diagnosis not present

## 2020-07-01 DIAGNOSIS — I509 Heart failure, unspecified: Secondary | ICD-10-CM | POA: Diagnosis not present

## 2020-07-01 DIAGNOSIS — G4733 Obstructive sleep apnea (adult) (pediatric): Secondary | ICD-10-CM | POA: Diagnosis not present

## 2020-07-01 DIAGNOSIS — I252 Old myocardial infarction: Secondary | ICD-10-CM | POA: Diagnosis not present

## 2020-07-01 DIAGNOSIS — E1169 Type 2 diabetes mellitus with other specified complication: Secondary | ICD-10-CM | POA: Diagnosis not present

## 2020-07-01 DIAGNOSIS — I429 Cardiomyopathy, unspecified: Secondary | ICD-10-CM | POA: Diagnosis not present

## 2020-07-01 DIAGNOSIS — E1142 Type 2 diabetes mellitus with diabetic polyneuropathy: Secondary | ICD-10-CM | POA: Diagnosis not present

## 2020-07-01 DIAGNOSIS — E669 Obesity, unspecified: Secondary | ICD-10-CM | POA: Diagnosis not present

## 2020-07-01 DIAGNOSIS — E782 Mixed hyperlipidemia: Secondary | ICD-10-CM | POA: Diagnosis not present

## 2020-07-01 DIAGNOSIS — R0602 Shortness of breath: Secondary | ICD-10-CM | POA: Diagnosis not present

## 2020-07-01 DIAGNOSIS — I739 Peripheral vascular disease, unspecified: Secondary | ICD-10-CM | POA: Diagnosis not present

## 2020-07-01 DIAGNOSIS — I639 Cerebral infarction, unspecified: Secondary | ICD-10-CM | POA: Diagnosis not present

## 2020-07-01 DIAGNOSIS — I10 Essential (primary) hypertension: Secondary | ICD-10-CM | POA: Diagnosis not present

## 2020-07-03 DIAGNOSIS — E1140 Type 2 diabetes mellitus with diabetic neuropathy, unspecified: Secondary | ICD-10-CM | POA: Diagnosis not present

## 2020-07-03 DIAGNOSIS — I2510 Atherosclerotic heart disease of native coronary artery without angina pectoris: Secondary | ICD-10-CM | POA: Diagnosis not present

## 2020-07-03 DIAGNOSIS — I10 Essential (primary) hypertension: Secondary | ICD-10-CM | POA: Diagnosis not present

## 2020-07-03 DIAGNOSIS — E1159 Type 2 diabetes mellitus with other circulatory complications: Secondary | ICD-10-CM | POA: Diagnosis not present

## 2020-07-03 DIAGNOSIS — E1151 Type 2 diabetes mellitus with diabetic peripheral angiopathy without gangrene: Secondary | ICD-10-CM | POA: Diagnosis not present

## 2020-07-03 DIAGNOSIS — I252 Old myocardial infarction: Secondary | ICD-10-CM | POA: Diagnosis not present

## 2020-07-05 DIAGNOSIS — E1159 Type 2 diabetes mellitus with other circulatory complications: Secondary | ICD-10-CM | POA: Diagnosis not present

## 2020-07-05 DIAGNOSIS — I252 Old myocardial infarction: Secondary | ICD-10-CM | POA: Diagnosis not present

## 2020-07-05 DIAGNOSIS — I10 Essential (primary) hypertension: Secondary | ICD-10-CM | POA: Diagnosis not present

## 2020-07-05 DIAGNOSIS — E1151 Type 2 diabetes mellitus with diabetic peripheral angiopathy without gangrene: Secondary | ICD-10-CM | POA: Diagnosis not present

## 2020-07-05 DIAGNOSIS — I2510 Atherosclerotic heart disease of native coronary artery without angina pectoris: Secondary | ICD-10-CM | POA: Diagnosis not present

## 2020-07-05 DIAGNOSIS — E1140 Type 2 diabetes mellitus with diabetic neuropathy, unspecified: Secondary | ICD-10-CM | POA: Diagnosis not present

## 2020-07-08 ENCOUNTER — Telehealth: Payer: Self-pay | Admitting: Internal Medicine

## 2020-07-08 DIAGNOSIS — E1151 Type 2 diabetes mellitus with diabetic peripheral angiopathy without gangrene: Secondary | ICD-10-CM | POA: Diagnosis not present

## 2020-07-08 DIAGNOSIS — I10 Essential (primary) hypertension: Secondary | ICD-10-CM | POA: Diagnosis not present

## 2020-07-08 DIAGNOSIS — I2510 Atherosclerotic heart disease of native coronary artery without angina pectoris: Secondary | ICD-10-CM | POA: Diagnosis not present

## 2020-07-08 DIAGNOSIS — E1140 Type 2 diabetes mellitus with diabetic neuropathy, unspecified: Secondary | ICD-10-CM | POA: Diagnosis not present

## 2020-07-08 DIAGNOSIS — E1159 Type 2 diabetes mellitus with other circulatory complications: Secondary | ICD-10-CM | POA: Diagnosis not present

## 2020-07-08 DIAGNOSIS — I252 Old myocardial infarction: Secondary | ICD-10-CM | POA: Diagnosis not present

## 2020-07-08 NOTE — Telephone Encounter (Signed)
Copied from [place] [id number]. Topic: General - Other >> Jul 08, 2020 11:42 AM Leward Quan A wrote: Reason for CRM: Hoyle Sauer PT with [facility] called in asking Dr Neomia Dear to please submit an order for a [place] for the patient asking if this can be sent to a medical supply like [facility] or whichever is more convenient for the patient. Asking if this can be done ASAP please Ph# [phone]

## 2020-07-09 ENCOUNTER — Other Ambulatory Visit: Payer: Self-pay

## 2020-07-09 ENCOUNTER — Other Ambulatory Visit: Payer: Medicare Other

## 2020-07-09 DIAGNOSIS — Z125 Encounter for screening for malignant neoplasm of prostate: Secondary | ICD-10-CM | POA: Diagnosis not present

## 2020-07-09 DIAGNOSIS — E1159 Type 2 diabetes mellitus with other circulatory complications: Secondary | ICD-10-CM | POA: Diagnosis not present

## 2020-07-09 DIAGNOSIS — I152 Hypertension secondary to endocrine disorders: Secondary | ICD-10-CM | POA: Diagnosis not present

## 2020-07-09 LAB — MICROALBUMIN, URINE WAIVED
Creatinine, Urine Waived: 50 mg/dL (ref 10–300)
Microalb, Ur Waived: 150 mg/L — ABNORMAL HIGH (ref 0–19)
Microalb/Creat Ratio: >300 mg/g — ABNORMAL HIGH (ref <30)

## 2020-07-09 LAB — BAYER DCA HB A1C WAIVED: HB A1C (BAYER DCA - WAIVED): 7.7 % — ABNORMAL HIGH (ref <7.0)

## 2020-07-09 NOTE — Telephone Encounter (Signed)
Prescription was sent over to [facility]

## 2020-07-10 LAB — CBC WITH DIFFERENTIAL/PLATELET
Basophils Absolute: 0.1 10*3/uL (ref 0.0–0.2)
Basos: 1 %
EOS (ABSOLUTE): 0.2 10*3/uL (ref 0.0–0.4)
Eos: 3 %
Hematocrit: 42.5 % (ref 37.5–51.0)
Hemoglobin: 14.2 g/dL (ref 13.0–17.7)
Immature Grans (Abs): 0 10*3/uL (ref 0.0–0.1)
Immature Granulocytes: 0 %
Lymphocytes Absolute: 1.3 10*3/uL (ref 0.7–3.1)
Lymphs: 26 %
MCH: 31.5 pg (ref 26.6–33.0)
MCHC: 33.4 g/dL (ref 31.5–35.7)
MCV: 94 fL (ref 79–97)
Monocytes Absolute: 0.5 10*3/uL (ref 0.1–0.9)
Monocytes: 11 %
Neutrophils Absolute: 2.9 10*3/uL (ref 1.4–7.0)
Neutrophils: 59 %
Platelets: 208 10*3/uL (ref 150–450)
RBC: 4.51 x10E6/uL (ref 4.14–5.80)
RDW: 14 % (ref 11.6–15.4)
WBC: 4.9 10*3/uL (ref 3.4–10.8)

## 2020-07-10 LAB — LIPID PANEL
Chol/HDL Ratio: 4.2 ratio (ref 0.0–5.0)
Cholesterol, Total: 142 mg/dL (ref 100–199)
HDL: 34 mg/dL — ABNORMAL LOW (ref >39)
LDL Chol Calc (NIH): 73 mg/dL (ref 0–99)
Triglycerides: 207 mg/dL — ABNORMAL HIGH (ref 0–149)
VLDL Cholesterol Cal: 35 mg/dL (ref 5–40)

## 2020-07-10 LAB — THYROID PANEL WITH TSH
Free Thyroxine Index: 1.8 (ref 1.2–4.9)
T3 Uptake Ratio: 30 % (ref 24–39)
T4, Total: 6 ug/dL (ref 4.5–12.0)
TSH: 6.04 u[IU]/mL — ABNORMAL HIGH (ref 0.450–4.500)

## 2020-07-10 LAB — COMPREHENSIVE METABOLIC PANEL
ALT: 11 IU/L (ref 0–44)
AST: 14 IU/L (ref 0–40)
Albumin/Globulin Ratio: 1.6 (ref 1.2–2.2)
Albumin: 4.2 g/dL (ref 3.7–4.7)
Alkaline Phosphatase: 64 IU/L (ref 44–121)
BUN/Creatinine Ratio: 14 (ref 10–24)
BUN: 15 mg/dL (ref 8–27)
Bilirubin Total: 0.5 mg/dL (ref 0.0–1.2)
CO2: 28 mmol/L (ref 20–29)
Calcium: 10.9 mg/dL — ABNORMAL HIGH (ref 8.6–10.2)
Chloride: 97 mmol/L (ref 96–106)
Creatinine, Ser: 1.06 mg/dL (ref 0.76–1.27)
Globulin, Total: 2.7 g/dL (ref 1.5–4.5)
Glucose: 132 mg/dL — ABNORMAL HIGH (ref 65–99)
Potassium: 4 mmol/L (ref 3.5–5.2)
Sodium: 140 mmol/L (ref 134–144)
Total Protein: 6.9 g/dL (ref 6.0–8.5)
eGFR: 71 mL/min/{1.73_m2} (ref >59)

## 2020-07-10 LAB — PSA TOTAL+% FREE (SERIAL)
PSA, Free Pct: 35.3 %
PSA, Free: 0.67 ng/mL
Prostate Specific Ag, Serum: 1.9 ng/mL (ref 0.0–4.0)

## 2020-07-11 DIAGNOSIS — I252 Old myocardial infarction: Secondary | ICD-10-CM | POA: Diagnosis not present

## 2020-07-11 DIAGNOSIS — E1151 Type 2 diabetes mellitus with diabetic peripheral angiopathy without gangrene: Secondary | ICD-10-CM | POA: Diagnosis not present

## 2020-07-11 DIAGNOSIS — I2510 Atherosclerotic heart disease of native coronary artery without angina pectoris: Secondary | ICD-10-CM | POA: Diagnosis not present

## 2020-07-11 DIAGNOSIS — I10 Essential (primary) hypertension: Secondary | ICD-10-CM | POA: Diagnosis not present

## 2020-07-11 DIAGNOSIS — E1140 Type 2 diabetes mellitus with diabetic neuropathy, unspecified: Secondary | ICD-10-CM | POA: Diagnosis not present

## 2020-07-11 DIAGNOSIS — E1159 Type 2 diabetes mellitus with other circulatory complications: Secondary | ICD-10-CM | POA: Diagnosis not present

## 2020-07-15 DIAGNOSIS — E1151 Type 2 diabetes mellitus with diabetic peripheral angiopathy without gangrene: Secondary | ICD-10-CM | POA: Diagnosis not present

## 2020-07-15 DIAGNOSIS — E1140 Type 2 diabetes mellitus with diabetic neuropathy, unspecified: Secondary | ICD-10-CM | POA: Diagnosis not present

## 2020-07-15 DIAGNOSIS — E1159 Type 2 diabetes mellitus with other circulatory complications: Secondary | ICD-10-CM | POA: Diagnosis not present

## 2020-07-15 DIAGNOSIS — I252 Old myocardial infarction: Secondary | ICD-10-CM | POA: Diagnosis not present

## 2020-07-15 DIAGNOSIS — I2510 Atherosclerotic heart disease of native coronary artery without angina pectoris: Secondary | ICD-10-CM | POA: Diagnosis not present

## 2020-07-15 DIAGNOSIS — I10 Essential (primary) hypertension: Secondary | ICD-10-CM | POA: Diagnosis not present

## 2020-07-15 NOTE — Telephone Encounter (Signed)
Patient called in to inform Dr Neomia Dear that [facility] does not accept his insurance so he is asking for the Rx to be sent to [facility] please Ph# [phone]

## 2020-07-16 NOTE — Telephone Encounter (Signed)
Order for [employer] cane has been sent to [facility]

## 2020-07-16 NOTE — Telephone Encounter (Signed)
Patient was called and informed.

## 2020-07-18 ENCOUNTER — Other Ambulatory Visit: Payer: Self-pay

## 2020-07-18 ENCOUNTER — Encounter: Payer: Self-pay | Admitting: Internal Medicine

## 2020-07-18 ENCOUNTER — Ambulatory Visit (INDEPENDENT_AMBULATORY_CARE_PROVIDER_SITE_OTHER): Payer: Medicare Other | Admitting: Internal Medicine

## 2020-07-18 VITALS — BP 140/78 | HR 57 | Temp 98.1°F | Ht 70.0 in | Wt 261.4 lb

## 2020-07-18 DIAGNOSIS — E1169 Type 2 diabetes mellitus with other specified complication: Secondary | ICD-10-CM

## 2020-07-18 DIAGNOSIS — E1159 Type 2 diabetes mellitus with other circulatory complications: Secondary | ICD-10-CM | POA: Diagnosis not present

## 2020-07-18 DIAGNOSIS — E039 Hypothyroidism, unspecified: Secondary | ICD-10-CM | POA: Diagnosis not present

## 2020-07-18 DIAGNOSIS — E785 Hyperlipidemia, unspecified: Secondary | ICD-10-CM | POA: Diagnosis not present

## 2020-07-18 DIAGNOSIS — E119 Type 2 diabetes mellitus without complications: Secondary | ICD-10-CM

## 2020-07-18 DIAGNOSIS — I152 Hypertension secondary to endocrine disorders: Secondary | ICD-10-CM

## 2020-07-18 MED ORDER — INSULIN GLARGINE 100 UNIT/ML ~~LOC~~ SOLN: 15.0000 [IU] | Freq: Every day | SUBCUTANEOUS | 11 refills | Status: DC

## 2020-07-18 NOTE — Progress Notes (Signed)
BP 140/78   Pulse (!) 57   Temp 98.1 F (36.7 C) (Oral)   Ht '5\' 10"'  (1.778 m)   Wt 261 lb 6.4 oz (118.6 kg)   SpO2 97%   BMI 37.51 kg/m    Subjective:    Patient ID: Justin Griffith, male    DOB: 03-03-1941, 80 y.o.   MRN: [id number]  HPI: Justin Griffith is a 80 y.o. male  Feels well today, brreathing well not on any o2 now. Sees [name] sees them every 3 weeks now.  history of recent STEMI late presenter PCI and stent congestive heart failure hypertension resultant hypotension from medication cardiomyopathy diabetes hyperlipidemia obstructive sleep apnea COPD  Diabetes He presents for his follow-up diabetic visit. He has type 2 diabetes mellitus. His disease course has been improving. Pertinent negatives for hypoglycemia include no headaches or sweats. Pertinent negatives for diabetes include no blurred vision, no chest pain, no fatigue, no foot paresthesias, no foot ulcerations, no polydipsia, no polyphagia, no polyuria, no visual change, no weakness and no weight loss.  Hypertension This is a chronic problem. The current episode started more than 1 year ago. The problem has been gradually improving since onset. The problem is controlled. Pertinent negatives include no blurred vision, chest pain, headaches, malaise/fatigue, neck pain, orthopnea, palpitations, peripheral edema, PND, shortness of breath or sweats.  Congestive Heart Failure Presents for follow-up visit. Pertinent negatives include no abdominal pain, chest pain, fatigue, near-syncope, nocturia, palpitations or shortness of breath.  COPD There is no chest tightness, cough, difficulty breathing, frequent throat clearing, hoarse voice, shortness of breath, sputum production or wheezing. Pertinent negatives include no chest pain, headaches, malaise/fatigue, PND, sweats or weight loss. His past medical history is significant for COPD.    Chief Complaint  Patient presents with  . Diabetes  . Hypertension  . Congestive  Heart Failure  . COPD  . Hyperlipidemia  . Benign Prostatic Hypertrophy    Relevant past medical, surgical, family and social history reviewed and updated as indicated. Interim medical history since our last visit reviewed. Allergies and medications reviewed and updated.  Review of Systems  Constitutional: Negative for fatigue, malaise/fatigue and weight loss.  HENT: Negative for hoarse voice.   Eyes: Negative for blurred vision.  Respiratory: Negative for cough, sputum production, shortness of breath and wheezing.   Cardiovascular: Negative for chest pain, palpitations, orthopnea, PND and near-syncope.  Gastrointestinal: Negative for abdominal pain.  Endocrine: Negative for polydipsia, polyphagia and polyuria.  Genitourinary: Negative for nocturia.  Musculoskeletal: Negative for neck pain.  Neurological: Negative for weakness and headaches.    Per HPI unless specifically indicated above     Objective:    BP 140/78   Pulse (!) 57   Temp 98.1 F (36.7 C) (Oral)   Ht '5\' 10"'  (1.778 m)   Wt 261 lb 6.4 oz (118.6 kg)   SpO2 97%   BMI 37.51 kg/m   Wt Readings from Last 3 Encounters:  07/18/20 261 lb 6.4 oz (118.6 kg)  06/07/20 258 lb 9.6 oz (117.3 kg)  05/23/20 260 lb (117.9 kg)    Physical Exam Vitals and nursing note reviewed.  Constitutional:      General: He is not in acute distress.    Appearance: Normal appearance. He is not ill-appearing or diaphoretic.  HENT:     Head: Normocephalic and atraumatic.     Right Ear: Tympanic membrane and external ear normal. There is no impacted cerumen.     Left  Ear: External ear normal.     Nose: No congestion or rhinorrhea.     Mouth/Throat:     Pharynx: No oropharyngeal exudate or posterior oropharyngeal erythema.  Eyes:     Conjunctiva/sclera: Conjunctivae normal.     Pupils: Pupils are equal, round, and reactive to light.  Cardiovascular:     Rate and Rhythm: Normal rate and regular rhythm.     Heart sounds: No murmur  heard. No friction rub. No gallop.   Pulmonary:     Effort: No respiratory distress.     Breath sounds: No stridor. No wheezing or rhonchi.  Chest:     Chest wall: No tenderness.  Abdominal:     General: Abdomen is flat. Bowel sounds are normal.     Palpations: Abdomen is soft. There is no mass.     Tenderness: There is no abdominal tenderness.  Musculoskeletal:     Cervical back: Normal range of motion and neck supple. No rigidity or tenderness.     Left lower leg: No edema.  Skin:    General: Skin is warm and dry.  Neurological:     Mental Status: He is alert.     Results for orders placed or performed in visit on 07/09/20  Microalbumin, Urine Waived  Result Value Ref Range   Microalb, Ur Waived 150 (H) 0 - 19 mg/L   Creatinine, Urine Waived 50 10 - 300 mg/dL   Microalb/Creat Ratio >300 (H) <30 mg/g  Bayer DCA Hb A1c Waived  Result Value Ref Range   HB A1C (BAYER DCA - WAIVED) 7.7 (H) <7.0 %  Lipid panel  Result Value Ref Range   Cholesterol, Total 142 100 - 199 mg/dL   Triglycerides 207 (H) 0 - 149 mg/dL   HDL 34 (L) >39 mg/dL   VLDL Cholesterol Cal 35 5 - 40 mg/dL   LDL Chol Calc (NIH) 73 0 - 99 mg/dL   Chol/HDL Ratio 4.2 0.0 - 5.0 ratio  Thyroid Panel With TSH  Result Value Ref Range   TSH 6.040 (H) 0.450 - 4.500 uIU/mL   T4, Total 6.0 4.5 - 12.0 ug/dL   T3 Uptake Ratio 30 24 - 39 %   Free Thyroxine Index 1.8 1.2 - 4.9  CBC with Differential/Platelet  Result Value Ref Range   WBC 4.9 3.4 - 10.8 x10E3/uL   RBC 4.51 4.14 - 5.80 x10E6/uL   Hemoglobin 14.2 13.0 - 17.7 g/dL   Hematocrit 42.5 37.5 - 51.0 %   MCV 94 79 - 97 fL   MCH 31.5 26.6 - 33.0 pg   MCHC 33.4 31.5 - 35.7 g/dL   RDW 14.0 11.6 - 15.4 %   Platelets 208 150 - 450 x10E3/uL   Neutrophils 59 Not Estab. %   Lymphs 26 Not Estab. %   Monocytes 11 Not Estab. %   Eos 3 Not Estab. %   Basos 1 Not Estab. %   Neutrophils Absolute 2.9 1.4 - 7.0 x10E3/uL   Lymphocytes Absolute 1.3 0.7 - 3.1 x10E3/uL    Monocytes Absolute 0.5 0.1 - 0.9 x10E3/uL   EOS (ABSOLUTE) 0.2 0.0 - 0.4 x10E3/uL   Basophils Absolute 0.1 0.0 - 0.2 x10E3/uL   Immature Granulocytes 0 Not Estab. %   Immature Grans (Abs) 0.0 0.0 - 0.1 x10E3/uL  Comprehensive metabolic panel  Result Value Ref Range   Glucose 132 (H) 65 - 99 mg/dL   BUN 15 8 - 27 mg/dL   Creatinine, Ser 1.06 0.76 - 1.27 mg/dL  eGFR 71 >59 mL/min/1.73   BUN/Creatinine Ratio 14 10 - 24   Sodium 140 134 - 144 mmol/L   Potassium 4.0 3.5 - 5.2 mmol/L   Chloride 97 96 - 106 mmol/L   CO2 28 20 - 29 mmol/L   Calcium 10.9 (H) 8.6 - 10.2 mg/dL   Total Protein 6.9 6.0 - 8.5 g/dL   Albumin 4.2 3.7 - 4.7 g/dL   Globulin, Total 2.7 1.5 - 4.5 g/dL   Albumin/Globulin Ratio 1.6 1.2 - 2.2   Bilirubin Total 0.5 0.0 - 1.2 mg/dL   Alkaline Phosphatase 64 44 - 121 IU/L   AST 14 0 - 40 IU/L   ALT 11 0 - 44 IU/L  PSA Total+%Free (Serial)  Result Value Ref Range   Prostate Specific Ag, Serum 1.9 0.0 - 4.0 ng/mL   PSA, Free 0.67 N/A ng/mL   PSA, Free Pct 35.3 %        Current Outpatient Medications:  .  spironolactone (ALDACTONE) 25 MG tablet, Take by mouth., Disp: , Rfl:  .  acyclovir (ZOVIRAX) 400 MG tablet, Take 400 mg by mouth 2 (two) times daily., Disp: , Rfl:  .  aspirin EC 81 MG EC tablet, Take 1 tablet (81 mg total) by mouth daily. Swallow whole., Disp: 30 tablet, Rfl: 0 .  azelastine (ASTELIN) 0.1 % nasal spray, Place 2 sprays into both nostrils 2 (two) times daily., Disp: , Rfl:  .  bacitracin-neomycin-polymyxin-hydrocortisone (CORTISPORIN) 1 % ophthalmic ointment, Place 1 application into both eyes 2 (two) times daily., Disp: 3.5 g, Rfl: 0 .  carvedilol (COREG) 12.5 MG tablet, Take 2 tablets (25 mg total) by mouth 2 (two) times daily with a meal., Disp: 120 tablet, Rfl: 4 .  clobetasol cream (TEMOVATE) 8.27 %, Apply 1 application topically 2 (two) times daily. Apply to bilateral lower legs., Disp: 30 g, Rfl: 1 .  cloNIDine (CATAPRES) 0.1 MG tablet, Take 1  tablet (0.1 mg total) by mouth 2 (two) times daily., Disp: 60 tablet, Rfl: 0 .  clopidogrel (PLAVIX) 75 MG tablet, Take by mouth., Disp: , Rfl:  .  clotrimazole-betamethasone (LOTRISONE) cream, Apply 1 application topically 2 (two) times daily., Disp: 30 g, Rfl: 0 .  donepezil (ARICEPT) 5 MG tablet, Take 1 tablet (5 mg total) by mouth at bedtime., Disp: 90 tablet, Rfl: 4 .  Fluticasone-Salmeterol (ADVAIR) 100-50 MCG/DOSE AEPB, Inhale 1 puff into the lungs 2 (two) times daily., Disp: 1 each, Rfl: 3 .  furosemide (LASIX) 20 MG tablet, Take 1 tablet (20 mg total) by mouth daily., Disp: 90 tablet, Rfl: 0 .  gabapentin (NEURONTIN) 300 MG capsule, Take 1 capsule by mouth 3 (three) times daily. (Patient not taking: No sig reported), Disp: , Rfl:  .  hydrochlorothiazide (HYDRODIURIL) 12.5 MG tablet, Take by mouth., Disp: , Rfl:  .  insulin glargine (LANTUS) 100 UNIT/ML injection, Inject 0.25 mLs (25 Units total) into the skin daily., Disp: 10 mL, Rfl: 11 .  ipratropium-albuterol (DUONEB) 0.5-2.5 (3) MG/3ML SOLN, Take 3 mLs by nebulization every 6 (six) hours., Disp: , Rfl:  .  isosorbide mononitrate (IMDUR) 30 MG 24 hr tablet, Take 1 tablet (30 mg total) by mouth daily., Disp: 30 tablet, Rfl: 0 .  meloxicam (MOBIC) 7.5 MG tablet, Take 7.5 mg by mouth as needed for pain., Disp: , Rfl:  .  metFORMIN (GLUCOPHAGE) 1000 MG tablet, Take 1,000 mg by mouth 2 (two) times daily with a meal., Disp: , Rfl:  .  mupirocin ointment (BACTROBAN) 2 %,  Apply 1 application topically 2 (two) times daily. To open blisters bilateral legs., Disp: 22 g, Rfl: 0 .  olmesartan (BENICAR) 40 MG tablet, Take 1 tablet (40 mg total) by mouth daily., Disp: 90 tablet, Rfl: 4 .  primidone (MYSOLINE) 50 MG tablet, Take 1 tablet (50 mg total) by mouth 2 (two) times daily., Disp: 180 tablet, Rfl: 4 .  rosuvastatin (CRESTOR) 40 MG tablet, Take 1 tablet (40 mg total) by mouth at bedtime., Disp: 90 tablet, Rfl: 3 .  sacubitril-valsartan (ENTRESTO)  49-51 MG, Take 1 tablet by mouth every 12 (twelve) hours., Disp: , Rfl:  .  tamsulosin (FLOMAX) 0.4 MG CAPS capsule, Take 0.4 mg by mouth daily. Patient restarted on his own due to trouble urinating, Disp: , Rfl:  .  tamsulosin (FLOMAX) 0.4 MG CAPS capsule, Take 1 capsule (0.4 mg total) by mouth daily., Disp: 30 capsule, Rfl: 0 .  ticagrelor (BRILINTA) 90 MG TABS tablet, Take 1 tablet (90 mg total) by mouth 2 (two) times daily., Disp: 60 tablet, Rfl: 0 .  Tiotropium Bromide Monohydrate (SPIRIVA RESPIMAT) 2.5 MCG/ACT AERS, Inhale 2 puffs into the lungs daily., Disp: 1 g, Rfl: 3 .  vitamin B-12 (CYANOCOBALAMIN) 500 MCG tablet, Take 2,000 mcg by mouth at bedtime., Disp: , Rfl:  .  Vitamin D, Cholecalciferol, 25 MCG (1000 UT) CAPS, Take 2,000 mcg by mouth at bedtime., Disp: , Rfl:     Assessment & Plan:  1.CAD/ CHF : stable, chronic doing much better ;  Is on  Lasix 20 down from 40 mg   and  Entresto 24/26 twice daily Coreg  2. OSA :  Stable sleep apnea better  Fu and mx per pulm  3. DM stable is on   insulin reduce to 15 untis  Metformin check HbA1c,  urine  microalbumin  diabetic diet plan given to pt  adviced regarding hypoglycemia and instructions given to pt today on how to prevent and treat the same if it were to occur. pt acknowledges the plan and voices understanding of the same.  exercise plan given and encouraged.   advice diabetic yearly podiatry, ophthalmology , nutritionist , dental check q 6 months,  4. ELEVATED tsh  SICK EUTHYROID ? WILL RECHECK  Ref. Range 02/27/2020 10:03 07/09/2020 09:54  TSH Latest Ref Range: 0.450 - 4.500 uIU/mL 3.530 6.040 (H)  T4,Free(Direct) Latest Ref Range: 0.82 - 1.77 ng/dL 0.95   Thyroxine (T4) Latest Ref Range: 4.5 - 12.0 ug/dL  6.0  Free Thyroxine Index Latest Ref Range: 1.2 - 4.9   1.8  T3 Uptake Ratio Latest Ref Range: 24 - 39 %  30   Problem List Items Addressed This Visit   None      Follow up plan: No follow-ups on file.  Health  Maintenance : PSA : 2022 Cscope :2017 Pneumonia vaccine : 2016 - prevanar  2017 pneumovax

## 2020-07-23 DIAGNOSIS — I509 Heart failure, unspecified: Secondary | ICD-10-CM | POA: Diagnosis not present

## 2020-07-23 DIAGNOSIS — I429 Cardiomyopathy, unspecified: Secondary | ICD-10-CM | POA: Diagnosis not present

## 2020-07-24 DIAGNOSIS — I10 Essential (primary) hypertension: Secondary | ICD-10-CM | POA: Diagnosis not present

## 2020-07-24 DIAGNOSIS — I429 Cardiomyopathy, unspecified: Secondary | ICD-10-CM | POA: Diagnosis not present

## 2020-07-24 DIAGNOSIS — E1142 Type 2 diabetes mellitus with diabetic polyneuropathy: Secondary | ICD-10-CM | POA: Diagnosis not present

## 2020-07-24 DIAGNOSIS — J449 Chronic obstructive pulmonary disease, unspecified: Secondary | ICD-10-CM | POA: Diagnosis not present

## 2020-07-24 DIAGNOSIS — I639 Cerebral infarction, unspecified: Secondary | ICD-10-CM | POA: Diagnosis not present

## 2020-07-24 DIAGNOSIS — I739 Peripheral vascular disease, unspecified: Secondary | ICD-10-CM | POA: Diagnosis not present

## 2020-07-24 DIAGNOSIS — R0602 Shortness of breath: Secondary | ICD-10-CM | POA: Diagnosis not present

## 2020-07-24 DIAGNOSIS — E669 Obesity, unspecified: Secondary | ICD-10-CM | POA: Diagnosis not present

## 2020-07-24 DIAGNOSIS — E782 Mixed hyperlipidemia: Secondary | ICD-10-CM | POA: Diagnosis not present

## 2020-07-24 DIAGNOSIS — I509 Heart failure, unspecified: Secondary | ICD-10-CM | POA: Diagnosis not present

## 2020-07-24 DIAGNOSIS — G4733 Obstructive sleep apnea (adult) (pediatric): Secondary | ICD-10-CM | POA: Diagnosis not present

## 2020-07-24 DIAGNOSIS — I252 Old myocardial infarction: Secondary | ICD-10-CM | POA: Diagnosis not present

## 2020-07-25 DIAGNOSIS — H903 Sensorineural hearing loss, bilateral: Secondary | ICD-10-CM | POA: Diagnosis not present

## 2020-07-25 DIAGNOSIS — H6123 Impacted cerumen, bilateral: Secondary | ICD-10-CM | POA: Diagnosis not present

## 2020-07-25 DIAGNOSIS — H6983 Other specified disorders of Eustachian tube, bilateral: Secondary | ICD-10-CM | POA: Diagnosis not present

## 2020-07-26 ENCOUNTER — Encounter: Payer: Self-pay | Admitting: Internal Medicine

## 2020-07-26 DIAGNOSIS — E1140 Type 2 diabetes mellitus with diabetic neuropathy, unspecified: Secondary | ICD-10-CM | POA: Diagnosis not present

## 2020-07-26 DIAGNOSIS — I2510 Atherosclerotic heart disease of native coronary artery without angina pectoris: Secondary | ICD-10-CM | POA: Diagnosis not present

## 2020-07-26 DIAGNOSIS — I10 Essential (primary) hypertension: Secondary | ICD-10-CM | POA: Diagnosis not present

## 2020-07-26 DIAGNOSIS — I252 Old myocardial infarction: Secondary | ICD-10-CM | POA: Diagnosis not present

## 2020-07-26 DIAGNOSIS — E1159 Type 2 diabetes mellitus with other circulatory complications: Secondary | ICD-10-CM | POA: Diagnosis not present

## 2020-07-26 DIAGNOSIS — E1151 Type 2 diabetes mellitus with diabetic peripheral angiopathy without gangrene: Secondary | ICD-10-CM | POA: Diagnosis not present

## 2020-07-30 ENCOUNTER — Ambulatory Visit (INDEPENDENT_AMBULATORY_CARE_PROVIDER_SITE_OTHER): Payer: Medicare Other | Admitting: General Practice

## 2020-07-30 ENCOUNTER — Telehealth: Payer: Medicare Other | Admitting: General Practice

## 2020-07-30 DIAGNOSIS — E119 Type 2 diabetes mellitus without complications: Secondary | ICD-10-CM

## 2020-07-30 DIAGNOSIS — E785 Hyperlipidemia, unspecified: Secondary | ICD-10-CM

## 2020-07-30 DIAGNOSIS — Z9181 History of falling: Secondary | ICD-10-CM

## 2020-07-30 DIAGNOSIS — I152 Hypertension secondary to endocrine disorders: Secondary | ICD-10-CM

## 2020-07-30 DIAGNOSIS — I2510 Atherosclerotic heart disease of native coronary artery without angina pectoris: Secondary | ICD-10-CM | POA: Diagnosis not present

## 2020-07-30 DIAGNOSIS — E1169 Type 2 diabetes mellitus with other specified complication: Secondary | ICD-10-CM

## 2020-07-30 DIAGNOSIS — E1159 Type 2 diabetes mellitus with other circulatory complications: Secondary | ICD-10-CM | POA: Diagnosis not present

## 2020-07-30 DIAGNOSIS — I214 Non-ST elevation (NSTEMI) myocardial infarction: Secondary | ICD-10-CM

## 2020-07-30 NOTE — Patient Instructions (Signed)
Visit Information  PATIENT GOALS: Goals Addressed            This Visit's Progress   . RNCM: Improve My Heart Health-Coronary Artery Disease       Timeframe:  Long-Range Goal Priority:  High Start Date:                             Expected End Date:     07-20-2021                Follow Up Date 10-01-2020   - be open to making changes - I can manage, know and watch for signs of a heart attack - if I have chest pain, call for help - learn about small changes that will make a big difference - learn my personal risk factors    Why is this important?    Lifestyle changes are key to improving the blood flow to your heart. Think about the things you can change and set a goal to live healthy.   Remember, when the blood vessels to your heart start to get clogged you may not have any symptoms.   Over time, they can get worse.   Don't ignore the signs, like chest pain, and get help right away.     Notes: recent hospitalization for NSTEMI. 07-30-2020: The patient recently had to reschedule his cardiology visit because his daughter could not take him to his appointment. The appointment has been rescheduled for June. Follows up again in June with pcp. Still having swelling in feet and legs. States today it is worse than usual.     . RNCM: Monitor and Manage My Blood Sugar-Diabetes Type 2       Timeframe:  Long-Range Goal Priority:  High Start Date:                             Expected End Date:      07-20-2021              Follow Up Date 10-01-2020   - check blood sugar at prescribed times - check blood sugar before and after exercise - check blood sugar if I feel it is too high or too low - enter blood sugar readings and medication or insulin into daily log - take the blood sugar log to all doctor visits    Why is this important?    Checking your blood sugar at home helps to keep it from getting very high or very low.   Writing the results in a diary or log helps the doctor know  how to care for you.   Your blood sugar log should have the time, date and the results.   Also, write down the amount of insulin or other medicine that you take.   Other information, like what you ate, exercise done and how you were feeling, will also be helpful.     Notes: Patient not consistently checking blood sugars. 07-30-2020: States his blood sugars are 140 to 160 currently.       Patient Care Plan: RNCM: Fall Risk (Adult)    Problem Identified: RNCM: Fall Risk   Priority: High    Long-Range Goal: RNCM: Absence of Fall and Fall-Related Injury   Priority: High  Note:   Current Barriers:  Marland Kitchen Knowledge Deficits related to fall precautions in patient with memory changes, frequent falls, recurrent hospitalization, non  compliance with medication regimen, and multiple chronic conditions impacting his care.  . Decreased adherence to prescribed treatment for fall prevention . Unable to independently manage his safety in the home as evidence by noncompliance and frequent falls  . Unable to self administer medications as prescribed . Does not adhere to provider recommendations re: medication as prescribed, keeping appointments  . Does not attend all scheduled provider appointments . Does not adhere to prescribed medication regimen . Lacks social connections . Unable to perform IADLs independently . Does not maintain contact with provider office . Does not contact provider office for questions/concerns . Lacks caregiver support.  . Non-adherence to scheduled provider appointments . Non-adherence to prescribed medication regimen . Cognitive Deficits Clinical Goal(s):  Marland Kitchen Over the next 120 days, patient will demonstrate improved adherence to prescribed treatment plan for decreasing falls as evidenced by patient reporting and review of EMR . Over the next 120 days, patient will verbalize using fall risk reduction strategies discussed . Over the next 120 days, patient will not experience  additional falls . Over the next 120 days, patient will verbalize understanding of plan for safety in the home and preventing falls  . Over the next 120 days, patient will work with RNCM, pcp and CCM team  to address needs related to falls and preventing injury in the home  . Over the next 120 days, patient will attend all scheduled medical appointments: 09-02-2020 . Over the next 120 days, patient will demonstrate improved health management independence as evidenced bycompliance with recommendations by the provider and no new falls in the home Interventions:  . Collaboration with Charlynne Cousins, MD regarding development and update of comprehensive plan of care as evidenced by provider attestation and co-signature . Inter-disciplinary care team collaboration (see longitudinal plan of care) . Provided written and verbal education re: Potential causes of falls and Fall prevention strategies . Reviewed medications and discussed potential side effects of medications such as dizziness and frequent urination . Assessed for s/s of orthostatic hypotension . Assessed for falls since last encounter. 07-30-2020: Denies any falls since last outreach. States he is doing good with recovering from his heart attack, just having swelling in his feet and legs.  . Assessed patients knowledge of fall risk prevention secondary to previously provided education. 07-30-2020: Review of fall prevention and safety  . Assessed working status of life alert bracelet and patient adherence . Provided patient information for fall alert systems . Evaluation of current treatment plan related to fall prevention  and patient's adherence to plan as established by provider. . Advised patient to call the office for new falls or changes in mobility . Provided education to patient re: safety in the home and use of DME to prevent falls  . Discussed plans with patient for ongoing care management follow up and provided patient with direct contact  information for care management team Patient Goals/Self-Care Activities . Over the next 120 days, patient will:   - Utilize walker or cane (assistive device) appropriately with all ambulation - De-clutter walkways - Change positions slowly - Wear secure fitting shoes at all times with ambulation - Utilize home lighting for dim lit areas - Demonstrate self and pet awareness at all times - activities of daily living skills assessed - assistive or adaptive device use encouraged - barriers to physical activity or exercise addressed - barriers to physical activity or exercise identified - barriers to safety identified - cognition assessed - cognitive-stimulating activities promoted - fall prevention plan reviewed and  updated - fear of falling, loss of independence and pain acknowledged - medication list reviewed - modification of home and work environment promoted - participation in rehabilitation therapy encouraged Follow Up Plan: Telephone follow up appointment with care management team member scheduled for: 10-01-2020 at 1:00 pm   Task: RNCM: Identify and Manage Contributors to Fall Risk   Note:   Care Management Activities:    - activities of daily living skills assessed - assistive or adaptive device use encouraged - barriers to physical activity or exercise addressed - barriers to physical activity or exercise identified - barriers to safety identified - cognition assessed - cognitive-stimulating activities promoted - fall prevention plan reviewed and updated - fear of falling, loss of independence and pain acknowledged - medication list reviewed - modification of home and work environment promoted - participation in rehabilitation therapy encouraged       Patient Care Plan: RNCM: Hypertension (Adult)    Problem Identified: RNCM: Hypertension (Hypertension)   Priority: Medium    Long-Range Goal: RNCM: Hypertension Monitored   Priority: Medium  Note:   Objective:   . Last practice recorded BP readings:  . BP Readings from Last 3 Encounters: .  07/18/20 . 140/78 .  06/07/20 . 129/75 .  05/23/20 . (!) 162/70 .    Marland Kitchen Most recent eGFR/CrCl: No results found for: EGFR  No components found for: CRCL Current Barriers:  Marland Kitchen Knowledge Deficits related to basic understanding of hypertension pathophysiology and self care management . Knowledge Deficits related to understanding of medications prescribed for management of hypertension . Non-adherence to prescribed medication regimen . Non-adherence to scheduled provider appointments . Cognitive Deficits . Limited Social Support . Unable to independently manage HTN . Unable to self administer medications as prescribed . Does not adhere to provider recommendations re: dietary restrictions, following up with specialist, non-compliance with regimen . Does not attend all scheduled provider appointments . Does not adhere to prescribed medication regimen . Lacks social connections . Unable to perform IADLs independently . Does not maintain contact with provider office . Does not contact provider office for questions/concerns Case Manager Clinical Goal(s):  Marland Kitchen Over the next 120 days, patient will verbalize understanding of plan for hypertension management . Over the next 120 days, patient will not experience hospital admission. Hospital Admissions in last 6 months = 3 . Over the next 120 days, patient will attend all scheduled medical appointments: 09-02-2020- with pcp, missed recent appointment with the cardiologist- rescheduled for June  . Over the next 120 days, patient will demonstrate improved adherence to prescribed treatment plan for hypertension as evidenced by taking all medications as prescribed, monitoring and recording blood pressure as directed, adhering to low sodium/DASH diet . Over the next 120 days, patient will demonstrate improved health management independence as evidenced by checking blood pressure as  directed and notifying PCP if SBP>160 or DBP > 90, taking all medications as prescribe, and adhering to a low sodium diet as discussed. . Over the next 120 days, patient will verbalize basic understanding of hypertension disease process and self health management plan as evidenced by normalized blood pressures, compliance with medications, diet, and MD recommendations.  Interventions:  . Collaboration with Charlynne Cousins, MD regarding development and update of comprehensive plan of care as evidenced by provider attestation and co-signature . Inter-disciplinary care team collaboration (see longitudinal plan of care) . UNABLE to independently:manage HTN . Evaluation of current treatment plan related to hypertension self management and patient's adherence to plan as established by  provider. 07-30-2020: The patient states his blood pressure is good but he is having worsening swelling in his feet and legs. When inquiring about his appointment with cardiology he said he could not go because his daughter could not take him. Ask about the Lasix and the patient states he takes "so many medications he does not know what he takes". Ask the patient about pill packs and he said he did not want to do that. Education on the ease of using pill packs vs bottles and the patient declined.  . Provided education to patient re: stroke prevention, s/s of heart attack and stroke, DASH diet, complications of uncontrolled blood pressure. 07-30-2020: The patient feels he is doing well from recovering from his heart attack. The only problem he has is the swelling in his feet and legs and its not better.  . Reviewed medications with patient and discussed importance of compliance. 07-30-2020: The patient states he takes his medications but he does not know what it is for. Patient has been non-compliant in the past with medication regimen and possibly may be now. Education and support given. The patient does not wish to do pill packaging  system.  . Discussed plans with patient for ongoing care management follow up and provided patient with direct contact information for care management team . Advised patient, providing education and rationale, to monitor blood pressure daily and record, calling PCP for findings outside established parameters.  . Reviewed scheduled/upcoming provider appointments including: 09-02-2020 at 0900 am Patient Goals/Self-Care Activities . Over the next 120 days, patient will:  - UNABLE to independently manage HTN Self administers medications as prescribed Attends all scheduled provider appointments- 07-30-2020: Recently missed cardiology follow up appointment Calls provider office for new concerns, questions, or BP outside discussed parameters Checks BP and records as discussed Follows a low sodium diet/DASH diet - blood pressure trends reviewed - depression screen reviewed - home or ambulatory blood pressure monitoring encouraged Follow Up Plan: Telephone follow up appointment with care management team member scheduled for: 10-01-2020 at 1 pm   Task: RNCM: Identify and Monitor Blood Pressure Elevation   Note:   Care Management Activities:    - blood pressure trends reviewed - depression screen reviewed - home or ambulatory blood pressure monitoring encouraged     Patient Care Plan: RNCM: Diabetes Type 2 (Adult)    Problem Identified: RNCM: Glycemic Management (Diabetes, Type 2)   Priority: Medium    Long-Range Goal: RNCM" Glycemic Management Optimized   Priority: Medium  Note:   Objective:  Lab Results  Component Value Date   HGBA1C 6.9 (H) 03/29/2020 .   Lab Results  Component Value Date   CREATININE 1.08 04/17/2020   CREATININE 0.97 04/03/2020   CREATININE 0.81 04/03/2020 .   Marland Kitchen No results found for: EGFR Current Barriers:  Marland Kitchen Knowledge Deficits related to basic Diabetes pathophysiology and self care/management . Knowledge Deficits related to medications used for management of  diabetes . Cognitive Deficits . Knowledge Deficits related to self administration of insulin . Does not use cbg meter  . Limited Social Support . Unable to independently DM . Unable to self administer medications as prescribed . Does not adhere to provider recommendations re: dietary restrictions, medication management, cbg monitoring  . Does not attend all scheduled provider appointments . Does not adhere to prescribed medication regimen . Lacks social connections . Unable to perform IADLs independently . Does not maintain contact with provider office . Does not contact provider office for questions/concerns .  [facility] Case Manager Clinical Goal(s):  . patient will demonstrate improved adherence to prescribed treatment plan for diabetes self care/management as evidenced by: daily monitoring and recording of CBG  adherence to ADA/ carb modified diet adherence to prescribed medication regimen Interventions:  . Collaboration with Charlynne Cousins, MD regarding development and update of comprehensive plan of care as evidenced by provider attestation and co-signature . Inter-disciplinary care team collaboration (see longitudinal plan of care) . Provided education to patient about basic DM disease process . Reviewed medications with patient and discussed importance of medication adherence . Discussed plans with patient for ongoing care management follow up and provided patient with direct contact information for care management team . Provided patient with written educational materials related to hypo and hyperglycemia and importance of correct treatment . Reviewed scheduled/upcoming provider appointments including: 09-02-2020 with pcp . Advised patient, providing education and rationale, to check cbg BID and record, calling pcp for findings outside established parameters.  07-30-2020: The patient states that his blood sugars are 140-160 consistently. Denies any new concerns related to  his DM management.  . Review of patient status, including review of consultants reports, relevant laboratory and other test results, and medications completed. Patient Goals/Self-Care Activities . Over the next 120 days, patient will:  - UNABLE to independently manage DM Self administers oral medications as prescribed Self administers insulin as prescribed Attends all scheduled provider appointments Checks blood sugars as prescribed and utilize hyper and hypoglycemia protocol as needed Adheres to prescribed ADA/carb modified - barriers to adherence to treatment plan identified - blood glucose monitoring encouraged - blood glucose readings reviewed - resources required to improve adherence to care identified - self-awareness of signs/symptoms of hypo or hyperglycemia encouraged - use of blood glucose monitoring log promoted  Follow Up Plan: Telephone follow up appointment with care management team member scheduled for: 10-01-2020 at 1 pm   Task: RNCM: Alleviate Barriers to Glycemic Management   Note:   Care Management Activities:    - barriers to adherence to treatment plan identified - blood glucose monitoring encouraged - blood glucose readings reviewed - resources required to improve adherence to care identified - self-awareness of signs/symptoms of hypo or hyperglycemia encouraged - use of blood glucose monitoring log promoted       Patient Care Plan: RNCM: Coronary Artery Disease (Adult)    Problem Identified: RNCM: Disease Progression (Coronary Artery Disease), HLD, NSTEMI   Priority: High    Long-Range Goal: RNCM: Disease Progression Prevented or Minimized/HLD/NSTEMI   Priority: Medium  Note:   Current Barriers:  . Poorly controlled hyperlipidemia, complicated by non compliance, frequent hospitalization, memory changes, not following plan of care . Current antihyperlipidemic regimen: Rosuvastatin 40 mg QD . Most recent lipid panel:     Component Value Date/Time    CHOL 103 02/02/2020 1646   CHOL 89 02/02/2017 0823   TRIG 171 (H) 02/02/2020 1646   TRIG 262 (H) 02/02/2017 0823   HDL 26 (L) 02/02/2020 1646   CHOLHDL 4.5 12/15/2019 0503   VLDL 45 (H) 12/15/2019 0503   VLDL 52 (H) 02/02/2017 0823   LDLCALC 48 02/02/2020 1646 .   Marland Kitchen ASCVD risk enhancing conditions: age >13, DM, HTN, former smoker . Unable to independently manage HLD, CAD, NSTEMI . Unable to self administer medications as prescribed . Does not attend all scheduled provider appointments . Does not adhere to prescribed medication regimen . Lacks social connections . Unable to perform IADLs independently . Does not maintain contact with  provider office . Does not contact provider office for questions/concerns  RN Care Manager Clinical Goal(s):  Marland Kitchen Over the next 120 days, patient will work with Consulting civil engineer, providers, and care team towards execution of optimized self-health management plan . Over the next 120 days, patient will verbalize understanding of plan for effective management of cardiac conditions. . Over the next 120 days, patient will work with [facility], pcp, and specialist  to address needs related to CAD,  HLD, complications from NSTEMI- January 2022 . Over the next 120 days, patient will not experience hospital admission. Hospital Admissions in last 6 months = 3 . Over the next 120 days, patient will demonstrate a decrease in cardiac events exacerbations as evidenced by compliance with plan of care and no admits to hospital  . Over the next 120 days, patient will attend all scheduled medical appointments: 09-02-2020  Interventions: . Collaboration with Charlynne Cousins, MD regarding development and update of comprehensive plan of care as evidenced by provider attestation and co-signature . Inter-disciplinary care team collaboration (see longitudinal plan of care) . Medication review performed; medication list updated in electronic medical record. 07-30-2020: Reviewed but unsure how  compliant the patient is with his medications regimen. Can not tell the RNCM dosages or reason he takes.  Bertram Savin care team collaboration (see longitudinal plan of care) . Referred to pharmacy team for assistance with HLD medication management . Evaluation of current treatment plan related to HLD and patient's adherence to plan as established by provider. 07-30-2020: The patient states he is recovering well from his heart attack. Complains of swelling in his feet and legs that is worse today. Missed cardiology appointment. Education on what worse looks like and to seek emergent care for changes.  . Advised patient to call the office for changes or questions . Provided education to patient re: heart healthy/ADA diet, changes to report, taking medications as ordered  . Reviewed medications with patient and discussed compliance, the patient is not always compliant with recommendations.  . Reviewed scheduled/upcoming provider appointments including: 09-02-2020 . Discussed plans with patient for ongoing care management follow up and provided patient with direct contact information for care management team  Patient Goals/Self-Care Activities: . Over the next 120 days, patient will:   - call for medicine refill 2 or 3 days before it runs out - call if I am sick and can't take my medicine - keep a list of all the medicines I take; vitamins and herbals too - learn to read medicine labels - use a pillbox to sort medicine - use an alarm clock or phone to remind me to take my medicine - change to whole grain breads, cereal, pasta - drink 6 to 8 glasses of water each day - eat 3 to 5 servings of fruits and vegetables each day - eat 5 or 6 small meals each day - fill half the plate with nonstarchy vegetables - join a weight loss program - manage portion size - prepare main meal at home 3 to 5 days each week - read food labels for fat, fiber, carbohydrates and portion size - be open to making  changes - I can manage, know and watch for signs of a heart attack - if I have chest pain, call for help - learn about small changes that will make a big difference - learn my personal risk factors  - barriers to treatment adherence reviewed and addressed - communicable disease prevention promoted - difficulty of making life-long changes acknowledged -  functional limitation screening reviewed - healthy lifestyle promoted - medication-adherence assessment completed - rescue (action) plan developed - response to pharmacologic therapy monitored - self-awareness of signs/symptoms of worsening disease encouraged  Follow Up Plan: Telephone follow up appointment with care management team member scheduled for: 10-01-2020 at 1 pm     Task: RNCM: Alleviate Barriers to Coronary Artery Disease Therapy   Note:   Care Management Activities:    - barriers to treatment adherence reviewed and addressed - communicable disease prevention promoted - difficulty of making life-long changes acknowledged - functional limitation screening reviewed - healthy lifestyle promoted - medication-adherence assessment completed - rescue (action) plan developed - response to pharmacologic therapy monitored - self-awareness of signs/symptoms of worsening disease encouraged        The patient verbalized understanding of instructions, educational materials, and care plan provided today and declined offer to receive copy of patient instructions, educational materials, and care plan.   Telephone follow up appointment with care management team member scheduled for:10-01-2020 at 1 pm  Noreene Larsson RN, MSN, [facility] Mobile: [phone]

## 2020-07-30 NOTE — Chronic Care Management (AMB) (Signed)
Chronic Care Management   CCM RN Visit Note  07/30/2020 Name: Justin Griffith MRN: [id number] DOB: [birth year]  Subjective: Justin Griffith is a 80 y.o. year old male who is a primary care patient of Vigg, Avanti, MD. The care management team was consulted for assistance with disease management and care coordination needs.    Engaged with patient by telephone for follow up visit in response to provider referral for case management and/or care coordination services.   Consent to Services:  The patient was given information about Chronic Care Management services, agreed to services, and gave verbal consent prior to initiation of services.  Please see initial visit note for detailed documentation.   Patient agreed to services and verbal consent obtained.   Assessment: Review of patient past medical history, allergies, medications, health status, including review of consultants reports, laboratory and other test data, was performed as part of comprehensive evaluation and provision of chronic care management services.   SDOH (Social Determinants of Health) assessments and interventions performed:    CCM Care Plan  Allergies  Allergen Reactions  . Wilder Glade [Dapagliflozin] Rash    Causes a severe rash in groin area   . Dulaglutide Diarrhea and Other (See Comments)  . Liraglutide Diarrhea and Other (See Comments)  . Jardiance [Empagliflozin] Rash    Outpatient Encounter Medications as of 07/30/2020  Medication Sig  . acyclovir (ZOVIRAX) 400 MG tablet Take 400 mg by mouth 2 (two) times daily.  Marland Kitchen aspirin EC 81 MG EC tablet Take 1 tablet (81 mg total) by mouth daily. Swallow whole.  Marland Kitchen azelastine (ASTELIN) 0.1 % nasal spray Place 2 sprays into both nostrils 2 (two) times daily.  . bacitracin-neomycin-polymyxin-hydrocortisone (CORTISPORIN) 1 % ophthalmic ointment Place 1 application into both eyes 2 (two) times daily.  . carvedilol (COREG) 12.5 MG tablet Take 2 tablets (25 mg total) by mouth 2  (two) times daily with a meal.  . clobetasol cream (TEMOVATE) 6.14 % Apply 1 application topically 2 (two) times daily. Apply to bilateral lower legs.  . cloNIDine (CATAPRES) 0.1 MG tablet Take 1 tablet (0.1 mg total) by mouth 2 (two) times daily.  . clopidogrel (PLAVIX) 75 MG tablet Take by mouth.  . clotrimazole-betamethasone (LOTRISONE) cream Apply 1 application topically 2 (two) times daily.  Marland Kitchen donepezil (ARICEPT) 5 MG tablet Take 1 tablet (5 mg total) by mouth at bedtime.  . Fluticasone-Salmeterol (ADVAIR) 100-50 MCG/DOSE AEPB Inhale 1 puff into the lungs 2 (two) times daily.  . furosemide (LASIX) 20 MG tablet Take 1 tablet (20 mg total) by mouth daily.  Marland Kitchen gabapentin (NEURONTIN) 300 MG capsule Take 1 capsule by mouth 3 (three) times daily. (Patient not taking: No sig reported)  . hydrochlorothiazide (HYDRODIURIL) 12.5 MG tablet Take by mouth.  . insulin glargine (LANTUS) 100 UNIT/ML injection Inject 0.15 mLs (15 Units total) into the skin daily.  Marland Kitchen ipratropium-albuterol (DUONEB) 0.5-2.5 (3) MG/3ML SOLN Take 3 mLs by nebulization every 6 (six) hours.  . isosorbide mononitrate (IMDUR) 30 MG 24 hr tablet Take 1 tablet (30 mg total) by mouth daily.  . meloxicam (MOBIC) 7.5 MG tablet Take 7.5 mg by mouth as needed for pain.  . metFORMIN (GLUCOPHAGE) 1000 MG tablet Take 1,000 mg by mouth 2 (two) times daily with a meal.  . mupirocin ointment (BACTROBAN) 2 % Apply 1 application topically 2 (two) times daily. To open blisters bilateral legs.  Marland Kitchen olmesartan (BENICAR) 40 MG tablet Take 1 tablet (40 mg total) by mouth daily.  Marland Kitchen  primidone (MYSOLINE) 50 MG tablet Take 1 tablet (50 mg total) by mouth 2 (two) times daily.  . rosuvastatin (CRESTOR) 40 MG tablet Take 1 tablet (40 mg total) by mouth at bedtime.  . sacubitril-valsartan (ENTRESTO) 49-51 MG Take 1 tablet by mouth every 12 (twelve) hours.  Marland Kitchen spironolactone (ALDACTONE) 25 MG tablet Take by mouth.  . tamsulosin (FLOMAX) 0.4 MG CAPS capsule Take 0.4  mg by mouth daily. Patient restarted on his own due to trouble urinating  . tamsulosin (FLOMAX) 0.4 MG CAPS capsule Take 1 capsule (0.4 mg total) by mouth daily.  . ticagrelor (BRILINTA) 90 MG TABS tablet Take 1 tablet (90 mg total) by mouth 2 (two) times daily.  . Tiotropium Bromide Monohydrate (SPIRIVA RESPIMAT) 2.5 MCG/ACT AERS Inhale 2 puffs into the lungs daily.  . vitamin B-12 (CYANOCOBALAMIN) 500 MCG tablet Take 2,000 mcg by mouth at bedtime.  . Vitamin D, Cholecalciferol, 25 MCG (1000 UT) CAPS Take 2,000 mcg by mouth at bedtime.   No facility-administered encounter medications on file as of 07/30/2020.    Patient Active Problem List   Diagnosis Date Noted  . Aortic atherosclerosis ([place]) 06/12/2020  . Embolic stroke ([place]) 34/74/2595  . AKI (acute kidney injury) ([place]) 05/18/2020  . Leukocytosis 05/16/2020  . Hypercalcemia 05/16/2020  . Acute on chronic diastolic CHF (congestive heart failure) ([place]) 05/16/2020  . At high risk for falls 05/08/2020  . History of cold sores 05/06/2020  . Hypomagnesemia 03/29/2020  . NSTEMI (non-ST elevated myocardial infarction) ([place]) 03/28/2020  . Hypokalemia 03/28/2020  . Elevated TSH 02/25/2020  . Poor mobility 02/02/2020  . Coronary artery disease 01/23/2020  . History of CVA (cerebrovascular accident) 01/23/2020  . Memory changes 01/01/2020  . Chronic venous insufficiency 05/23/2019  . B12 deficiency 04/23/2019  . Constipation 04/21/2019  . Tremor 11/14/2018  . Morbid obesity ([place]) 09/07/2017  . COPD (chronic obstructive pulmonary disease) ([place]) 04/29/2017  . Intertrigo 02/02/2017  . BPH (benign prostatic hyperplasia) 07/23/2016  . Advanced care planning/counseling discussion 07/23/2016  . Eosinophilic esophagitis 63/87/5643  . PAD (peripheral artery disease) ([place]) 01/08/2016  . Gastroesophageal reflux disease without esophagitis 01/06/2016  . Hyperlipidemia associated with type 2 diabetes mellitus ([place]) 01/16/2015  . Sleep apnea  01/16/2015  . BMI 40.0-44.9, adult ([place]) 01/16/2015  . Hypertension associated with diabetes ([place]) 10/11/2014  . Type 2 diabetes mellitus with proteinuria ([place]) 10/11/2014  . Back pain 10/11/2014  . History of colon cancer     Conditions to be addressed/monitored:CAD, HTN, HLD, DMII and Nstemi, and falls prevention/safety  Care Plan : RNCM: Fall Risk (Adult)  Updates made by Vanita Ingles since 07/30/2020 12:00 AM    Problem: RNCM: Fall Risk   Priority: High    Long-Range Goal: RNCM: Absence of Fall and Fall-Related Injury   Priority: High  Note:   Current Barriers:  Marland Kitchen Knowledge Deficits related to fall precautions in patient with memory changes, frequent falls, recurrent hospitalization, non compliance with medication regimen, and multiple chronic conditions impacting his care.  . Decreased adherence to prescribed treatment for fall prevention . Unable to independently manage his safety in the home as evidence by noncompliance and frequent falls  . Unable to self administer medications as prescribed . Does not adhere to provider recommendations re: medication as prescribed, keeping appointments  . Does not attend all scheduled provider appointments . Does not adhere to prescribed medication regimen . Lacks social connections . Unable to perform IADLs independently . Does not maintain contact with provider office .  Does not contact provider office for questions/concerns . Lacks caregiver support.  . Non-adherence to scheduled provider appointments . Non-adherence to prescribed medication regimen . Cognitive Deficits Clinical Goal(s):  Marland Kitchen Over the next 120 days, patient will demonstrate improved adherence to prescribed treatment plan for decreasing falls as evidenced by patient reporting and review of EMR . Over the next 120 days, patient will verbalize using fall risk reduction strategies discussed . Over the next 120 days, patient will not experience additional falls . Over the  next 120 days, patient will verbalize understanding of plan for safety in the home and preventing falls  . Over the next 120 days, patient will work with RNCM, pcp and CCM team  to address needs related to falls and preventing injury in the home  . Over the next 120 days, patient will attend all scheduled medical appointments: 09-02-2020 . Over the next 120 days, patient will demonstrate improved health management independence as evidenced bycompliance with recommendations by the provider and no new falls in the home Interventions:  . Collaboration with Charlynne Cousins, MD regarding development and update of comprehensive plan of care as evidenced by provider attestation and co-signature . Inter-disciplinary care team collaboration (see longitudinal plan of care) . Provided written and verbal education re: Potential causes of falls and Fall prevention strategies . Reviewed medications and discussed potential side effects of medications such as dizziness and frequent urination . Assessed for s/s of orthostatic hypotension . Assessed for falls since last encounter. 07-30-2020: Denies any falls since last outreach. States he is doing good with recovering from his heart attack, just having swelling in his feet and legs.  . Assessed patients knowledge of fall risk prevention secondary to previously provided education. 07-30-2020: Review of fall prevention and safety  . Assessed working status of life alert bracelet and patient adherence . Provided patient information for fall alert systems . Evaluation of current treatment plan related to fall prevention  and patient's adherence to plan as established by provider. . Advised patient to call the office for new falls or changes in mobility . Provided education to patient re: safety in the home and use of DME to prevent falls  . Discussed plans with patient for ongoing care management follow up and provided patient with direct contact information for care  management team Patient Goals/Self-Care Activities . Over the next 120 days, patient will:   - Utilize walker or cane (assistive device) appropriately with all ambulation - De-clutter walkways - Change positions slowly - Wear secure fitting shoes at all times with ambulation - Utilize home lighting for dim lit areas - Demonstrate self and pet awareness at all times - activities of daily living skills assessed - assistive or adaptive device use encouraged - barriers to physical activity or exercise addressed - barriers to physical activity or exercise identified - barriers to safety identified - cognition assessed - cognitive-stimulating activities promoted - fall prevention plan reviewed and updated - fear of falling, loss of independence and pain acknowledged - medication list reviewed - modification of home and work environment promoted - participation in rehabilitation therapy encouraged Follow Up Plan: Telephone follow up appointment with care management team member scheduled for: 10-01-2020 at 1:00 pm   Care Plan : RNCM: Hypertension (Adult)  Updates made by Vanita Ingles since 07/30/2020 12:00 AM    Problem: RNCM: Hypertension (Hypertension)   Priority: Medium    Long-Range Goal: RNCM: Hypertension Monitored   Priority: Medium  Note:   Objective:  .  Last practice recorded BP readings:  . BP Readings from Last 3 Encounters: .  07/18/20 . 140/78 .  06/07/20 . 129/75 .  05/23/20 . (!) 162/70 .    Marland Kitchen Most recent eGFR/CrCl: No results found for: EGFR  No components found for: CRCL Current Barriers:  Marland Kitchen Knowledge Deficits related to basic understanding of hypertension pathophysiology and self care management . Knowledge Deficits related to understanding of medications prescribed for management of hypertension . Non-adherence to prescribed medication regimen . Non-adherence to scheduled provider appointments . Cognitive Deficits . Limited Social Support . Unable to  independently manage HTN . Unable to self administer medications as prescribed . Does not adhere to provider recommendations re: dietary restrictions, following up with specialist, non-compliance with regimen . Does not attend all scheduled provider appointments . Does not adhere to prescribed medication regimen . Lacks social connections . Unable to perform IADLs independently . Does not maintain contact with provider office . Does not contact provider office for questions/concerns Case Manager Clinical Goal(s):  Marland Kitchen Over the next 120 days, patient will verbalize understanding of plan for hypertension management . Over the next 120 days, patient will not experience hospital admission. Hospital Admissions in last 6 months = 3 . Over the next 120 days, patient will attend all scheduled medical appointments: 09-02-2020- with pcp, missed recent appointment with the cardiologist- rescheduled for June  . Over the next 120 days, patient will demonstrate improved adherence to prescribed treatment plan for hypertension as evidenced by taking all medications as prescribed, monitoring and recording blood pressure as directed, adhering to low sodium/DASH diet . Over the next 120 days, patient will demonstrate improved health management independence as evidenced by checking blood pressure as directed and notifying PCP if SBP>160 or DBP > 90, taking all medications as prescribe, and adhering to a low sodium diet as discussed. . Over the next 120 days, patient will verbalize basic understanding of hypertension disease process and self health management plan as evidenced by normalized blood pressures, compliance with medications, diet, and MD recommendations.  Interventions:  . Collaboration with Charlynne Cousins, MD regarding development and update of comprehensive plan of care as evidenced by provider attestation and co-signature . Inter-disciplinary care team collaboration (see longitudinal plan of care) . UNABLE to  independently:manage HTN . Evaluation of current treatment plan related to hypertension self management and patient's adherence to plan as established by provider. 07-30-2020: The patient states his blood pressure is good but he is having worsening swelling in his feet and legs. When inquiring about his appointment with cardiology he said he could not go because his daughter could not take him. Ask about the Lasix and the patient states he takes "so many medications he does not know what he takes". Ask the patient about pill packs and he said he did not want to do that. Education on the ease of using pill packs vs bottles and the patient declined.  . Provided education to patient re: stroke prevention, s/s of heart attack and stroke, DASH diet, complications of uncontrolled blood pressure. 07-30-2020: The patient feels he is doing well from recovering from his heart attack. The only problem he has is the swelling in his feet and legs and its not better.  . Reviewed medications with patient and discussed importance of compliance. 07-30-2020: The patient states he takes his medications but he does not know what it is for. Patient has been non-compliant in the past with medication regimen and possibly may be now. Education and  support given. The patient does not wish to do pill packaging system.  . Discussed plans with patient for ongoing care management follow up and provided patient with direct contact information for care management team . Advised patient, providing education and rationale, to monitor blood pressure daily and record, calling PCP for findings outside established parameters.  . Reviewed scheduled/upcoming provider appointments including: 09-02-2020 at 0900 am Patient Goals/Self-Care Activities . Over the next 120 days, patient will:  - UNABLE to independently manage HTN Self administers medications as prescribed Attends all scheduled provider appointments- 07-30-2020: Recently missed cardiology  follow up appointment Calls provider office for new concerns, questions, or BP outside discussed parameters Checks BP and records as discussed Follows a low sodium diet/DASH diet - blood pressure trends reviewed - depression screen reviewed - home or ambulatory blood pressure monitoring encouraged Follow Up Plan: Telephone follow up appointment with care management team member scheduled for: 10-01-2020 at 1 pm   Care Plan : RNCM: Diabetes Type 2 (Adult)  Updates made by Vanita Ingles since 07/30/2020 12:00 AM    Problem: RNCM: Glycemic Management (Diabetes, Type 2)   Priority: Medium    Long-Range Goal: RNCM" Glycemic Management Optimized   Priority: Medium  Note:   Objective:  Lab Results  Component Value Date   HGBA1C 6.9 (H) 03/29/2020 .   Lab Results  Component Value Date   CREATININE 1.08 04/17/2020   CREATININE 0.97 04/03/2020   CREATININE 0.81 04/03/2020 .   Marland Kitchen No results found for: EGFR Current Barriers:  Marland Kitchen Knowledge Deficits related to basic Diabetes pathophysiology and self care/management . Knowledge Deficits related to medications used for management of diabetes . Cognitive Deficits . Knowledge Deficits related to self administration of insulin . Does not use cbg meter  . Limited Social Support . Unable to independently DM . Unable to self administer medications as prescribed . Does not adhere to provider recommendations re: dietary restrictions, medication management, cbg monitoring  . Does not attend all scheduled provider appointments . Does not adhere to prescribed medication regimen . Lacks social connections . Unable to perform IADLs independently . Does not maintain contact with provider office . Does not contact provider office for questions/concerns . [facility] Case Manager Clinical Goal(s):  . patient will demonstrate improved adherence to prescribed treatment plan for diabetes self care/management as evidenced by: daily monitoring and  recording of CBG  adherence to ADA/ carb modified diet adherence to prescribed medication regimen Interventions:  . Collaboration with Charlynne Cousins, MD regarding development and update of comprehensive plan of care as evidenced by provider attestation and co-signature . Inter-disciplinary care team collaboration (see longitudinal plan of care) . Provided education to patient about basic DM disease process . Reviewed medications with patient and discussed importance of medication adherence . Discussed plans with patient for ongoing care management follow up and provided patient with direct contact information for care management team . Provided patient with written educational materials related to hypo and hyperglycemia and importance of correct treatment . Reviewed scheduled/upcoming provider appointments including: 09-02-2020 with pcp . Advised patient, providing education and rationale, to check cbg BID and record, calling pcp for findings outside established parameters.  07-30-2020: The patient states that his blood sugars are 140-160 consistently. Denies any new concerns related to his DM management.  . Review of patient status, including review of consultants reports, relevant laboratory and other test results, and medications completed. Patient Goals/Self-Care Activities . Over the next 120 days, patient will:  -  UNABLE to independently manage DM Self administers oral medications as prescribed Self administers insulin as prescribed Attends all scheduled provider appointments Checks blood sugars as prescribed and utilize hyper and hypoglycemia protocol as needed Adheres to prescribed ADA/carb modified - barriers to adherence to treatment plan identified - blood glucose monitoring encouraged - blood glucose readings reviewed - resources required to improve adherence to care identified - self-awareness of signs/symptoms of hypo or hyperglycemia encouraged - use of blood glucose monitoring  log promoted  Follow Up Plan: Telephone follow up appointment with care management team member scheduled for: 10-01-2020 at 1 pm   Care Plan : RNCM: Coronary Artery Disease (Adult)  Updates made by Vanita Ingles since 07/30/2020 12:00 AM    Problem: RNCM: Disease Progression (Coronary Artery Disease), HLD, NSTEMI   Priority: High    Long-Range Goal: RNCM: Disease Progression Prevented or Minimized/HLD/NSTEMI   Priority: Medium  Note:   Current Barriers:  . Poorly controlled hyperlipidemia, complicated by non compliance, frequent hospitalization, memory changes, not following plan of care . Current antihyperlipidemic regimen: Rosuvastatin 40 mg QD . Most recent lipid panel:     Component Value Date/Time   CHOL 103 02/02/2020 1646   CHOL 89 02/02/2017 0823   TRIG 171 (H) 02/02/2020 1646   TRIG 262 (H) 02/02/2017 0823   HDL 26 (L) 02/02/2020 1646   CHOLHDL 4.5 12/15/2019 0503   VLDL 45 (H) 12/15/2019 0503   VLDL 52 (H) 02/02/2017 0823   LDLCALC 48 02/02/2020 1646 .   Marland Kitchen ASCVD risk enhancing conditions: age >71, DM, HTN, former smoker . Unable to independently manage HLD, CAD, NSTEMI . Unable to self administer medications as prescribed . Does not attend all scheduled provider appointments . Does not adhere to prescribed medication regimen . Lacks social connections . Unable to perform IADLs independently . Does not maintain contact with provider office . Does not contact provider office for questions/concerns  RN Care Manager Clinical Goal(s):  Marland Kitchen Over the next 120 days, patient will work with Consulting civil engineer, providers, and care team towards execution of optimized self-health management plan . Over the next 120 days, patient will verbalize understanding of plan for effective management of cardiac conditions. . Over the next 120 days, patient will work with [facility], pcp, and specialist  to address needs related to CAD,  HLD, complications from NSTEMI- January 2022 . Over the next  120 days, patient will not experience hospital admission. Hospital Admissions in last 6 months = 3 . Over the next 120 days, patient will demonstrate a decrease in cardiac events exacerbations as evidenced by compliance with plan of care and no admits to hospital  . Over the next 120 days, patient will attend all scheduled medical appointments: 09-02-2020  Interventions: . Collaboration with Charlynne Cousins, MD regarding development and update of comprehensive plan of care as evidenced by provider attestation and co-signature . Inter-disciplinary care team collaboration (see longitudinal plan of care) . Medication review performed; medication list updated in electronic medical record. 07-30-2020: Reviewed but unsure how compliant the patient is with his medications regimen. Can not tell the RNCM dosages or reason he takes.  Bertram Savin care team collaboration (see longitudinal plan of care) . Referred to pharmacy team for assistance with HLD medication management . Evaluation of current treatment plan related to HLD and patient's adherence to plan as established by provider. 07-30-2020: The patient states he is recovering well from his heart attack. Complains of swelling in his feet and legs that  is worse today. Missed cardiology appointment. Education on what worse looks like and to seek emergent care for changes.  . Advised patient to call the office for changes or questions . Provided education to patient re: heart healthy/ADA diet, changes to report, taking medications as ordered  . Reviewed medications with patient and discussed compliance, the patient is not always compliant with recommendations.  . Reviewed scheduled/upcoming provider appointments including: 09-02-2020 . Discussed plans with patient for ongoing care management follow up and provided patient with direct contact information for care management team  Patient Goals/Self-Care Activities: . Over the next 120 days, patient will:    - call for medicine refill 2 or 3 days before it runs out - call if I am sick and can't take my medicine - keep a list of all the medicines I take; vitamins and herbals too - learn to read medicine labels - use a pillbox to sort medicine - use an alarm clock or phone to remind me to take my medicine - change to whole grain breads, cereal, pasta - drink 6 to 8 glasses of water each day - eat 3 to 5 servings of fruits and vegetables each day - eat 5 or 6 small meals each day - fill half the plate with nonstarchy vegetables - join a weight loss program - manage portion size - prepare main meal at home 3 to 5 days each week - read food labels for fat, fiber, carbohydrates and portion size - be open to making changes - I can manage, know and watch for signs of a heart attack - if I have chest pain, call for help - learn about small changes that will make a big difference - learn my personal risk factors  - barriers to treatment adherence reviewed and addressed - communicable disease prevention promoted - difficulty of making life-long changes acknowledged - functional limitation screening reviewed - healthy lifestyle promoted - medication-adherence assessment completed - rescue (action) plan developed - response to pharmacologic therapy monitored - self-awareness of signs/symptoms of worsening disease encouraged  Follow Up Plan: Telephone follow up appointment with care management team member scheduled for: 10-01-2020 at 1 pm       Plan:Telephone follow up appointment with care management team member scheduled for:  10-01-2020 at 1 pm  Noreene Larsson RN, MSN, [facility] Mobile: [phone]

## 2020-08-21 ENCOUNTER — Telehealth: Payer: Self-pay | Admitting: Internal Medicine

## 2020-08-21 NOTE — Telephone Encounter (Signed)
Copied from [place] [id number]. Topic: Medicare AWV >> Aug 21, 2020 11:28 AM Cher Nakai R wrote: Reason for CRM:  Left Message re: [facility] schedule change - AWVS rescheduled from 6/6 to 6/10 at 1:00 to be completed by phone not in the office-srs

## 2020-08-22 ENCOUNTER — Other Ambulatory Visit: Payer: Medicare Other

## 2020-08-22 ENCOUNTER — Other Ambulatory Visit: Payer: Self-pay

## 2020-08-22 DIAGNOSIS — E785 Hyperlipidemia, unspecified: Secondary | ICD-10-CM | POA: Diagnosis not present

## 2020-08-22 DIAGNOSIS — E1159 Type 2 diabetes mellitus with other circulatory complications: Secondary | ICD-10-CM

## 2020-08-22 DIAGNOSIS — E039 Hypothyroidism, unspecified: Secondary | ICD-10-CM

## 2020-08-22 DIAGNOSIS — E1169 Type 2 diabetes mellitus with other specified complication: Secondary | ICD-10-CM | POA: Diagnosis not present

## 2020-08-22 DIAGNOSIS — I152 Hypertension secondary to endocrine disorders: Secondary | ICD-10-CM | POA: Diagnosis not present

## 2020-08-22 DIAGNOSIS — E119 Type 2 diabetes mellitus without complications: Secondary | ICD-10-CM

## 2020-08-22 LAB — MICROALBUMIN, URINE WAIVED
Creatinine, Urine Waived: 100 mg/dL (ref 10–300)
Microalb, Ur Waived: 150 mg/L — ABNORMAL HIGH (ref 0–19)
Microalb/Creat Ratio: >300 mg/g — ABNORMAL HIGH (ref <30)

## 2020-08-22 LAB — BAYER DCA HB A1C WAIVED: HB A1C (BAYER DCA - WAIVED): 6.6 % (ref <7.0)

## 2020-08-23 LAB — CBC WITH DIFFERENTIAL/PLATELET
Basophils Absolute: 0.1 10*3/uL (ref 0.0–0.2)
Basos: 1 %
EOS (ABSOLUTE): 0.3 10*3/uL (ref 0.0–0.4)
Eos: 4 %
Hematocrit: 41 % (ref 37.5–51.0)
Hemoglobin: 14 g/dL (ref 13.0–17.7)
Immature Grans (Abs): 0 10*3/uL (ref 0.0–0.1)
Immature Granulocytes: 0 %
Lymphocytes Absolute: 1.4 10*3/uL (ref 0.7–3.1)
Lymphs: 23 %
MCH: 32.2 pg (ref 26.6–33.0)
MCHC: 34.1 g/dL (ref 31.5–35.7)
MCV: 94 fL (ref 79–97)
Monocytes Absolute: 0.5 10*3/uL (ref 0.1–0.9)
Monocytes: 8 %
Neutrophils Absolute: 3.7 10*3/uL (ref 1.4–7.0)
Neutrophils: 64 %
Platelets: 197 10*3/uL (ref 150–450)
RBC: 4.35 x10E6/uL (ref 4.14–5.80)
RDW: 12.9 % (ref 11.6–15.4)
WBC: 5.9 10*3/uL (ref 3.4–10.8)

## 2020-08-23 LAB — COMPREHENSIVE METABOLIC PANEL
ALT: 10 IU/L (ref 0–44)
AST: 10 IU/L (ref 0–40)
Albumin/Globulin Ratio: 1.6 (ref 1.2–2.2)
Albumin: 4 g/dL (ref 3.7–4.7)
Alkaline Phosphatase: 46 IU/L (ref 44–121)
BUN/Creatinine Ratio: 19 (ref 10–24)
BUN: 18 mg/dL (ref 8–27)
Bilirubin Total: 0.4 mg/dL (ref 0.0–1.2)
CO2: 24 mmol/L (ref 20–29)
Calcium: 10.5 mg/dL — ABNORMAL HIGH (ref 8.6–10.2)
Chloride: 103 mmol/L (ref 96–106)
Creatinine, Ser: 0.94 mg/dL (ref 0.76–1.27)
Globulin, Total: 2.5 g/dL (ref 1.5–4.5)
Glucose: 115 mg/dL — ABNORMAL HIGH (ref 65–99)
Potassium: 4.4 mmol/L (ref 3.5–5.2)
Sodium: 144 mmol/L (ref 134–144)
Total Protein: 6.5 g/dL (ref 6.0–8.5)
eGFR: 82 mL/min/{1.73_m2} (ref >59)

## 2020-08-23 LAB — LIPID PANEL
Chol/HDL Ratio: 3.6 ratio (ref 0.0–5.0)
Cholesterol, Total: 114 mg/dL (ref 100–199)
HDL: 32 mg/dL — ABNORMAL LOW (ref >39)
LDL Chol Calc (NIH): 43 mg/dL (ref 0–99)
Triglycerides: 247 mg/dL — ABNORMAL HIGH (ref 0–149)
VLDL Cholesterol Cal: 39 mg/dL (ref 5–40)

## 2020-08-23 LAB — THYROID PANEL WITH TSH
Free Thyroxine Index: 1.8 (ref 1.2–4.9)
T3 Uptake Ratio: 28 % (ref 24–39)
T4, Total: 6.3 ug/dL (ref 4.5–12.0)
TSH: 3.3 u[IU]/mL (ref 0.450–4.500)

## 2020-08-26 ENCOUNTER — Ambulatory Visit: Payer: Medicare Other

## 2020-08-30 ENCOUNTER — Ambulatory Visit (INDEPENDENT_AMBULATORY_CARE_PROVIDER_SITE_OTHER): Payer: Medicare Other

## 2020-08-30 VITALS — Ht 71.0 in | Wt 256.0 lb

## 2020-08-30 DIAGNOSIS — Z0000 Encounter for general adult medical examination without abnormal findings: Secondary | ICD-10-CM

## 2020-08-30 NOTE — Patient Instructions (Signed)
[name] , Thank you for taking time to come for your Medicare Wellness Visit. I appreciate your ongoing commitment to your health goals. Please review the following plan we discussed and let me know if I can assist you in the future.   Screening recommendations/referrals: Colonoscopy: not required Recommended yearly ophthalmology/optometry visit for glaucoma screening and checkup Recommended yearly dental visit for hygiene and checkup  Vaccinations: Influenza vaccine: completed 11/28/2019, due 10/21/2020 Pneumococcal vaccine: completed 01/30/2016 Tdap vaccine: completed 05/19/2018, due 05/19/2028 Shingles vaccine: completed   Covid-19:  04/25/2019, 04/01/2019  Advanced directives: Advance directive discussed with you today.   Conditions/risks identified: none  Next appointment: Follow up in one year for your annual wellness visit.   Preventive Care 52 Years and Older, Male Preventive care refers to lifestyle choices and visits with your health care provider that can promote health and wellness. What does preventive care include? A yearly physical exam. This is also called an annual well check. Dental exams once or twice a year. Routine eye exams. Ask your health care provider how often you should have your eyes checked. Personal lifestyle choices, including: Daily care of your teeth and gums. Regular physical activity. Eating a healthy diet. Avoiding tobacco and drug use. Limiting alcohol use. Practicing safe sex. Taking low doses of aspirin every day. Taking vitamin and mineral supplements as recommended by your health care provider. What happens during an annual well check? The services and screenings done by your health care provider during your annual well check will depend on your age, overall health, lifestyle risk factors, and family history of disease. Counseling  Your health care provider may ask you questions about your: Alcohol use. Tobacco use. Drug use. Emotional  well-being. Home and relationship well-being. Sexual activity. Eating habits. History of falls. Memory and ability to understand (cognition). Work and work Statistician. Screening  You may have the following tests or measurements: Height, weight, and BMI. Blood pressure. Lipid and cholesterol levels. These may be checked every 5 years, or more frequently if you are over 41 years old. Skin check. Lung cancer screening. You may have this screening every year starting at age 40 if you have a 30-pack-year history of smoking and currently smoke or have quit within the past 15 years. Fecal occult blood test (FOBT) of the stool. You may have this test every year starting at age 38. Flexible sigmoidoscopy or colonoscopy. You may have a sigmoidoscopy every 5 years or a colonoscopy every 10 years starting at age 69. Prostate cancer screening. Recommendations will vary depending on your family history and other risks. Hepatitis C blood test. Hepatitis B blood test. Sexually transmitted disease (STD) testing. Diabetes screening. This is done by checking your blood sugar (glucose) after you have not eaten for a while (fasting). You may have this done every 1-3 years. Abdominal aortic aneurysm (AAA) screening. You may need this if you are a current or former smoker. Osteoporosis. You may be screened starting at age 61 if you are at high risk. Talk with your health care provider about your test results, treatment options, and if necessary, the need for more tests. Vaccines  Your health care provider may recommend certain vaccines, such as: Influenza vaccine. This is recommended every year. Tetanus, diphtheria, and acellular pertussis (Tdap, Td) vaccine. You may need a Td booster every 10 years. Zoster vaccine. You may need this after age 71. Pneumococcal 13-valent conjugate (PCV13) vaccine. One dose is recommended after age 42. Pneumococcal polysaccharide (PPSV23) vaccine. One dose is  recommended after  age 9. Talk to your health care provider about which screenings and vaccines you need and how often you need them. This information is not intended to replace advice given to you by your health care provider. Make sure you discuss any questions you have with your health care provider. Document Released: 04/05/2015 Document Revised: 11/27/2015 Document Reviewed: 01/08/2015 Elsevier Interactive Patient Education  2017 [place] Prevention in the Home Falls can cause injuries. They can happen to people of all ages. There are many things you can do to make your home safe and to help prevent falls. What can I do on the outside of my home? Regularly fix the edges of walkways and driveways and fix any cracks. Remove anything that might make you trip as you walk through a door, such as a raised step or threshold. Trim any bushes or trees on the path to your home. Use bright outdoor lighting. Clear any walking paths of anything that might make someone trip, such as rocks or tools. Regularly check to see if handrails are loose or broken. Make sure that both sides of any steps have handrails. Any raised decks and porches should have guardrails on the edges. Have any leaves, snow, or ice cleared regularly. Use sand or salt on walking paths during winter. Clean up any spills in your garage right away. This includes oil or grease spills. What can I do in the bathroom? Use night lights. Install grab bars by the toilet and in the tub and shower. Do not use towel bars as grab bars. Use non-skid mats or decals in the tub or shower. If you need to sit down in the shower, use a plastic, non-slip stool. Keep the floor dry. Clean up any water that spills on the floor as soon as it happens. Remove soap buildup in the tub or shower regularly. Attach bath mats securely with double-sided non-slip rug tape. Do not have throw rugs and other things on the floor that can make you trip. What can I do in the  bedroom? Use night lights. Make sure that you have a light by your bed that is easy to reach. Do not use any sheets or blankets that are too big for your bed. They should not hang down onto the floor. Have a firm chair that has side arms. You can use this for support while you get dressed. Do not have throw rugs and other things on the floor that can make you trip. What can I do in the kitchen? Clean up any spills right away. Avoid walking on wet floors. Keep items that you use a lot in easy-to-reach places. If you need to reach something above you, use a strong step stool that has a grab bar. Keep electrical cords out of the way. Do not use floor polish or wax that makes floors slippery. If you must use wax, use non-skid floor wax. Do not have throw rugs and other things on the floor that can make you trip. What can I do with my stairs? Do not leave any items on the stairs. Make sure that there are handrails on both sides of the stairs and use them. Fix handrails that are broken or loose. Make sure that handrails are as long as the stairways. Check any carpeting to make sure that it is firmly attached to the stairs. Fix any carpet that is loose or worn. Avoid having throw rugs at the top or bottom of the stairs. If  you do have throw rugs, attach them to the floor with carpet tape. Make sure that you have a light switch at the top of the stairs and the bottom of the stairs. If you do not have them, ask someone to add them for you. What else can I do to help prevent falls? Wear shoes that: Do not have high heels. Have rubber bottoms. Are comfortable and fit you well. Are closed at the toe. Do not wear sandals. If you use a stepladder: Make sure that it is fully opened. Do not climb a closed stepladder. Make sure that both sides of the stepladder are locked into place. Ask someone to hold it for you, if possible. Clearly mark and make sure that you can see: Any grab bars or  handrails. First and last steps. Where the edge of each step is. Use tools that help you move around (mobility aids) if they are needed. These include: Canes. Walkers. Scooters. Crutches. Turn on the lights when you go into a dark area. Replace any light bulbs as soon as they burn out. Set up your furniture so you have a clear path. Avoid moving your furniture around. If any of your floors are uneven, fix them. If there are any pets around you, be aware of where they are. Review your medicines with your doctor. Some medicines can make you feel dizzy. This can increase your chance of falling. Ask your doctor what other things that you can do to help prevent falls. This information is not intended to replace advice given to you by your health care provider. Make sure you discuss any questions you have with your health care provider. Document Released: 01/03/2009 Document Revised: 08/15/2015 Document Reviewed: 04/13/2014 Elsevier Interactive Patient Education  2017 [employer].

## 2020-08-30 NOTE — Progress Notes (Signed)
I connected with Justin Griffith today by telephone and verified that I am speaking with the correct person using two identifiers. Location patient: home Location provider: work Persons participating in the virtual visit: Reed Dady, Glenna Durand LPN.   I discussed the limitations, risks, security and privacy concerns of performing an evaluation and management service by telephone and the availability of in person appointments. I also discussed with the patient that there may be a patient responsible charge related to this service. The patient expressed understanding and verbally consented to this telephonic visit.    Interactive audio and video telecommunications were attempted between this provider and patient, however failed, due to patient having technical difficulties OR patient did not have access to video capability.  We continued and completed visit with audio only.     Vital signs may be patient reported or missing.  Subjective:   Justin Griffith is a 80 y.o. male who presents for Medicare Annual/Subsequent preventive examination.  Review of Systems     Cardiac Risk Factors include: advanced age (>65men, >70 women);diabetes mellitus;dyslipidemia;male gender;hypertension;obesity (BMI >30kg/m2);sedentary lifestyle     Objective:    Today's Vitals   08/30/20 1257  Weight: 256 lb (116.1 kg)  Height: 5\' 11"  (1.803 m)   Body mass index is 35.7 kg/m.  Advanced Directives 08/30/2020 05/17/2020 05/16/2020 05/16/2020 03/28/2020 03/28/2020 01/24/2020  Does Patient Have a Medical Advance Directive? No - No No No No No  Would patient like information on creating a medical advance directive? - No - Patient declined No - Patient declined No - Patient declined - No - Patient declined No - Patient declined    Current Medications (verified) Outpatient Encounter Medications as of 08/30/2020  Medication Sig   acyclovir (ZOVIRAX) 400 MG tablet Take 400 mg by mouth 2 (two) times daily.   aspirin EC  81 MG EC tablet Take 1 tablet (81 mg total) by mouth daily. Swallow whole.   azelastine (ASTELIN) 0.1 % nasal spray Place 2 sprays into both nostrils 2 (two) times daily.   bacitracin-neomycin-polymyxin-hydrocortisone (CORTISPORIN) 1 % ophthalmic ointment Place 1 application into both eyes 2 (two) times daily.   carvedilol (COREG) 12.5 MG tablet Take 2 tablets (25 mg total) by mouth 2 (two) times daily with a meal.   clobetasol cream (TEMOVATE) 3.38 % Apply 1 application topically 2 (two) times daily. Apply to bilateral lower legs.   cloNIDine (CATAPRES) 0.1 MG tablet Take 1 tablet (0.1 mg total) by mouth 2 (two) times daily.   clopidogrel (PLAVIX) 75 MG tablet Take by mouth.   clotrimazole-betamethasone (LOTRISONE) cream Apply 1 application topically 2 (two) times daily.   donepezil (ARICEPT) 5 MG tablet Take 1 tablet (5 mg total) by mouth at bedtime.   Fluticasone-Salmeterol (ADVAIR) 100-50 MCG/DOSE AEPB Inhale 1 puff into the lungs 2 (two) times daily.   furosemide (LASIX) 20 MG tablet Take 1 tablet (20 mg total) by mouth daily.   gabapentin (NEURONTIN) 300 MG capsule Take 1 capsule by mouth 3 (three) times daily. (Patient not taking: No sig reported)   hydrochlorothiazide (HYDRODIURIL) 12.5 MG tablet Take by mouth.   insulin glargine (LANTUS) 100 UNIT/ML injection Inject 0.15 mLs (15 Units total) into the skin daily.   ipratropium-albuterol (DUONEB) 0.5-2.5 (3) MG/3ML SOLN Take 3 mLs by nebulization every 6 (six) hours.   isosorbide mononitrate (IMDUR) 30 MG 24 hr tablet Take 1 tablet (30 mg total) by mouth daily.   meloxicam (MOBIC) 7.5 MG tablet Take 7.5 mg by mouth as  needed for pain.   metFORMIN (GLUCOPHAGE) 1000 MG tablet Take 1,000 mg by mouth 2 (two) times daily with a meal.   mupirocin ointment (BACTROBAN) 2 % Apply 1 application topically 2 (two) times daily. To open blisters bilateral legs.   olmesartan (BENICAR) 40 MG tablet Take 1 tablet (40 mg total) by mouth daily.   primidone  (MYSOLINE) 50 MG tablet Take 1 tablet (50 mg total) by mouth 2 (two) times daily.   rosuvastatin (CRESTOR) 40 MG tablet Take 1 tablet (40 mg total) by mouth at bedtime.   sacubitril-valsartan (ENTRESTO) 49-51 MG Take 1 tablet by mouth every 12 (twelve) hours.   spironolactone (ALDACTONE) 25 MG tablet Take by mouth.   tamsulosin (FLOMAX) 0.4 MG CAPS capsule Take 0.4 mg by mouth daily. Patient restarted on his own due to trouble urinating   tamsulosin (FLOMAX) 0.4 MG CAPS capsule Take 1 capsule (0.4 mg total) by mouth daily.   ticagrelor (BRILINTA) 90 MG TABS tablet Take 1 tablet (90 mg total) by mouth 2 (two) times daily.   Tiotropium Bromide Monohydrate (SPIRIVA RESPIMAT) 2.5 MCG/ACT AERS Inhale 2 puffs into the lungs daily.   vitamin B-12 (CYANOCOBALAMIN) 500 MCG tablet Take 2,000 mcg by mouth at bedtime.   Vitamin D, Cholecalciferol, 25 MCG (1000 UT) CAPS Take 2,000 mcg by mouth at bedtime.   No facility-administered encounter medications on file as of 08/30/2020.    Allergies (verified) Farxiga [dapagliflozin], Dulaglutide, Liraglutide, and Jardiance [empagliflozin]   History: Past Medical History:  Diagnosis Date   Arthritis    Asthma 2000   Back pain    Colon polyp 2012   COPD (chronic obstructive pulmonary disease) (HCC)    Emphysema of lung ([place])    Heart murmur    Hernia 2000   Hyperlipidemia    Hypertension    Personal history of malignant neoplasm of large intestine    Personal history of tobacco use, presenting hazards to health    Sleep apnea    Special screening for malignant neoplasms, colon    Type 2 diabetes mellitus with proteinuria ([place]) 10/11/2014   Past Surgical History:  Procedure Laterality Date   CARDIAC CATHETERIZATION Left 09/25/2015   Procedure: Left Heart Cath and Coronary Angiography;  Surgeon: Yolonda Kida, MD;  Location: [facility];  Service: Cardiovascular;  Laterality: Left;   CHOLECYSTECTOMY  1970   COLON SURGERY  07-16-99   sigmoid  colon resection with primary anastomosis, chemotherapy for metastatic disease   COLONOSCOPY  2001, 2012   Dr Bary Castilla, tubular adenoma of the cecum and ascending colon in 2012.   COLONOSCOPY WITH PROPOFOL N/A 02/12/2016   Procedure: COLONOSCOPY WITH PROPOFOL;  Surgeon: Robert Bellow, MD;  Location: [facility];  Service: Endoscopy;  Laterality: N/A;   CORONARY STENT INTERVENTION N/A 05/17/2020   Procedure: CORONARY STENT INTERVENTION;  Surgeon: Yolonda Kida, MD;  Location: [facility];  Service: Cardiovascular;  Laterality: N/A;  LAD   ESOPHAGOGASTRODUODENOSCOPY (EGD) WITH PROPOFOL N/A 02/12/2016   Procedure: ESOPHAGOGASTRODUODENOSCOPY (EGD) WITH PROPOFOL;  Surgeon: Robert Bellow, MD;  Location: [facility] ENDOSCOPY;  Service: Endoscopy;  Laterality: N/A;   HERNIA REPAIR Right    right inguinal hernia repair   JOINT REPLACEMENT Bilateral    knee replacement   KNEE SURGERY Bilateral 2010   LEFT HEART CATH AND CORONARY ANGIOGRAPHY N/A 05/17/2020   Procedure: LEFT HEART CATH AND CORONARY ANGIOGRAPHY possible PCI and stent;  Surgeon: Yolonda Kida, MD;  Location: [facility];  Service: Cardiovascular;  Laterality: N/A;   MYRINGOTOMY WITH TUBE PLACEMENT Bilateral 08/16/2014   Procedure: MYRINGOTOMY WITH TUBE PLACEMENT;  Surgeon: Carloyn Manner, MD;  Location: [facility] OR[facility];  Service: ENT;  Laterality: Bilateral;   PERIPHERAL VASCULAR CATHETERIZATION Right 09/04/2015   Procedure: Lower Extremity Angiography;  Surgeon: Katha Cabal, MD;  Location: [place] [facility];  Service: Cardiovascular;  Laterality: Right;   PERIPHERAL VASCULAR CATHETERIZATION  09/04/2015   Procedure: Lower Extremity Intervention;  Surgeon: Katha Cabal, MD;  Location: [facility];  Service: Cardiovascular;;   TONSILLECTOMY     TYMPANOSTOMY TUBE PLACEMENT     Family History  Problem Relation Age of Onset   Diabetes Father    Esophageal cancer Mother    Alzheimer's disease  Paternal Uncle    Social History   Socioeconomic History   Marital status: Married    Spouse name: Not on file   Number of children: Not on file   Years of education: Not on file   Highest education level: GED or equivalent  Occupational History   Occupation: retired    Comment: [employer]  Tobacco Use   Smoking status: Former    Packs/day: 1.00    Years: 30.00    Pack years: 30.00    Types: Cigarettes    Quit date: 03/23/1989    Years since quitting: 31.4   Smokeless tobacco: Never  Vaping Use   Vaping Use: Never used  Substance and Sexual Activity   Alcohol use: No    Alcohol/week: 0.0 standard drinks   Drug use: No   Sexual activity: Not on file  Other Topics Concern   Not on file  Social History Narrative   [employer]    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    Social Determinants of Health   Financial Resource Strain: Low Risk    Difficulty of Paying Living Expenses: Not hard at all  Food Insecurity: No Food Insecurity   Worried About Charity fundraiser in the Last Year: Never true   [place] in the Last Year: Never true  Transportation Needs: No Transportation Needs   Lack of Transportation (Medical): No   Lack of Transportation (Non-Medical): No  Physical Activity: Inactive   Days of Exercise per Week: 0 days   Minutes of Exercise per Session: 0 min  Stress: No Stress Concern Present   Feeling of Stress : Not at all  Social Connections: Not on file    Tobacco Counseling Counseling given: Not Answered   Clinical Intake:  Pre-visit preparation completed: Yes  Pain : No/denies pain     Nutritional Status: BMI > 30  Obese Nutritional Risks: None Diabetes: Yes  How often do you need to have someone help you when you read instructions, pamphlets, or other written materials from your doctor or pharmacy?: 1 - Never  Diabetic? Yes Nutrition Risk Assessment:  Has the patient had any N/V/D within the last 2 months?  No  Does the patient have any non-healing  wounds?  No  Has the patient had any unintentional weight loss or weight gain?  No   Diabetes:  Is the patient diabetic?  Yes  If diabetic, was a CBG obtained today?  No  Did the patient bring in their glucometer from home?  No  How often do you monitor your CBG's? daily.   Financial Strains and Diabetes Management:  Are you having any financial strains with the device, your supplies or your medication? No .  Does the patient want to be seen by Chronic Care Management for management of their diabetes?  No  Would the patient like to be referred to a Nutritionist or for Diabetic Management?  No   Diabetic Exams:  Diabetic Eye Exam: Overdue for diabetic eye exam. Pt has been advised about the importance in completing this exam. Patient advised to call and schedule an eye exam. Diabetic Foot Exam: Overdue, Pt has been advised about the importance in completing this exam. Pt is scheduled for diabetic foot exam on next appointment.   Interpreter Needed?: No  Information entered by :: NAllen LPN   Activities of Daily Living In your present state of health, do you have any difficulty performing the following activities: 08/30/2020 05/16/2020  Hearing? Tempie Donning  Comment wears hearing aides -  Vision? N N  Difficulty concentrating or making decisions? Y N  Comment sometimes -  Walking or climbing stairs? Y N  Dressing or bathing? N N  Doing errands, shopping? N N  Preparing Food and eating ? N -  Using the Toilet? N -  In the past six months, have you accidently leaked urine? N -  Do you have problems with loss of bowel control? N -  Managing your Medications? N -  Managing your Finances? N -  Housekeeping or managing your Housekeeping? N -  Some recent data might be hidden    Patient Care Team: Charlynne Cousins, MD as PCP - General (Internal Medicine) Guadalupe Maple, MD (Family Medicine) Bary Castilla, Forest Gleason, MD (General Surgery) Carloyn Manner, MD as Referring Physician  (Otolaryngology) Schnier, Dolores Lory, MD (Vascular Surgery) Abisogun, Domenica Reamer, MD as Consulting Physician (Internal Medicine) Vanita Ingles, RN as Registered Nurse ([place]) Vladimir Faster, [facility] as Pharmacist (Pharmacist)  Indicate any recent Medical Services you may have received from other than [facility] providers in the past year (date may be approximate).     Assessment:   This is a routine wellness examination for Justin Griffith.  Hearing/Vision screen No results found.  Dietary issues and exercise activities discussed: Current Exercise Habits: The patient does not participate in regular exercise at present   Goals Addressed             This Visit's Progress    Patient Stated       08/30/2020, no goals        Depression Screen PHQ 2/9 Scores 08/30/2020 07/18/2020 05/15/2020 05/08/2020 02/27/2020 08/23/2019 05/23/2019  PHQ - 2 Score 0 0 0 0 0 0 0  PHQ- 9 Score - - - - 0 - -    Fall Risk Fall Risk  08/30/2020 07/18/2020 06/04/2020 05/15/2020 05/08/2020  Falls in the past year? 1 1 1 1 1   Comment loses balance - - - -  Number falls in past yr: 1 - 1 1 1   Injury with Fall? 0 - 1 1 -  Comment - - black eye - -  Risk for fall due to : Impaired balance/gait;Impaired mobility;Medication side effect - History of fall(s);Impaired balance/gait Impaired balance/gait;History of fall(s);Impaired mobility History of fall(s);Impaired balance/gait  Follow up Falls evaluation completed;Education provided;Falls prevention discussed - Falls prevention discussed;Education provided - Falls evaluation completed    FALL RISK PREVENTION PERTAINING TO THE HOME:  Any stairs in or around the home? No  If so, are there any without handrails? N/a Home free of loose throw rugs in walkways, pet beds, electrical cords, etc? Yes  Adequate lighting in your home to reduce risk of  falls? Yes   ASSISTIVE DEVICES UTILIZED TO PREVENT FALLS:  Life alert? No  Use of a cane, walker or w/c? Yes  Grab bars in the  bathroom? Yes  Shower chair or bench in shower? No  Elevated toilet seat or a handicapped toilet? Yes   TIMED UP AND GO:  Was the test performed? No .      Cognitive Function:     6CIT Screen 05/08/2020 01/01/2020 08/22/2018 08/20/2017  What Year? 0 points 4 points 0 points 0 points  What month? 0 points 0 points 0 points 0 points  What time? 0 points 0 points 0 points 0 points  Count back from 20 2 points 0 points 0 points 0 points  Months in reverse 2 points 0 points 0 points 0 points  Repeat phrase 8 points 8 points 0 points 0 points  Total Score 12 12 0 0    Immunizations Immunization History  Administered Date(s) Administered   Fluad Quad(high Dose 65+) 12/14/2018, 11/28/2019   Influenza, High Dose Seasonal PF 02/02/2017   Influenza,inj,Quad PF,6+ Mos 01/16/2015   Influenza-Unspecified 01/20/2016, 01/01/2017, 02/15/2018   PFIZER(Purple Top)SARS-COV-2 Vaccination 04/01/2019, 04/25/2019   Pneumococcal Conjugate-13 07/05/2014   Pneumococcal Polysaccharide-23 01/30/2016   Pneumococcal-Unspecified 03/23/2000, 12/09/2004   Td 01/19/2008   Tdap 02/20/2018, 05/19/2018   Zoster Recombinat (Shingrix) 08/26/2017, 11/11/2017   Zoster, Live 07/28/2011    TDAP status: Up to date  Flu Vaccine status: Up to date  Pneumococcal vaccine status: Up to date  Covid-19 vaccine status: Completed vaccines  Qualifies for Shingles Vaccine? Yes   Zostavax completed Yes   Shingrix Completed?: Yes  Screening Tests Health Maintenance  Topic Date Due   COVID-19 Vaccine (3 - Booster for Pfizer series) 09/22/2019   OPHTHALMOLOGY EXAM  08/20/2020   FOOT EXAM  06/07/2021 (Originally 03/27/2020)   INFLUENZA VACCINE  10/21/2020   HEMOGLOBIN A1C  02/21/2021   TETANUS/TDAP  05/19/2028   PNA vac Low Risk Adult  Completed   Zoster Vaccines- Shingrix  Completed   HPV VACCINES  Aged Out    Health Maintenance  Health Maintenance Due  Topic Date Due   COVID-19 Vaccine (3 - Booster for Pfizer  series) 09/22/2019   OPHTHALMOLOGY EXAM  08/20/2020    Colorectal cancer screening: No longer required.   Lung Cancer Screening: (Low Dose CT Chest recommended if Age 43-80 years, 30 pack-year currently smoking OR have quit w/in 15years.) does not qualify.   Lung Cancer Screening Referral: no  Additional Screening:  Hepatitis C Screening: does not qualify;   Vision Screening: Recommended annual ophthalmology exams for early detection of glaucoma and other disorders of the eye. Is the patient up to date with their annual eye exam?  No  Who is the provider or what is the name of the office in which the patient attends annual [facility] exams? [facility] If pt is not established with a provider, would they like to be referred to a provider to establish care? No .   Dental Screening: Recommended annual dental exams for proper oral hygiene  Community Resource Referral / Chronic Care Management: CRR required this visit?  No   CCM required this visit?  No      Plan:     I have personally reviewed and noted the following in the patient's chart:   Medical and social history Use of alcohol, tobacco or illicit drugs  Current medications and supplements including opioid prescriptions. Patient is not currently taking opioid prescriptions. Functional ability and status  Nutritional status Physical activity Advanced directives List of other physicians Hospitalizations, surgeries, and ER visits in previous 12 months Vitals Screenings to include cognitive, depression, and falls Referrals and appointments  In addition, I have reviewed and discussed with patient certain preventive protocols, quality metrics, and best practice recommendations. A written personalized care plan for preventive services as well as general preventive health recommendations were provided to patient.     Kellie Simmering, LPN   [id number]   Nurse Notes: 6 CIT not administered. Patient appears cognitive per direct  conversation.

## 2020-09-02 ENCOUNTER — Encounter: Payer: Self-pay | Admitting: Internal Medicine

## 2020-09-02 ENCOUNTER — Other Ambulatory Visit: Payer: Self-pay

## 2020-09-02 ENCOUNTER — Ambulatory Visit (INDEPENDENT_AMBULATORY_CARE_PROVIDER_SITE_OTHER): Payer: Medicare Other | Admitting: Internal Medicine

## 2020-09-02 VITALS — BP 140/80 | HR 57 | Temp 98.7°F | Resp 20 | Ht 70.0 in | Wt 264.4 lb

## 2020-09-02 DIAGNOSIS — E1159 Type 2 diabetes mellitus with other circulatory complications: Secondary | ICD-10-CM

## 2020-09-02 DIAGNOSIS — I152 Hypertension secondary to endocrine disorders: Secondary | ICD-10-CM | POA: Diagnosis not present

## 2020-09-02 DIAGNOSIS — E785 Hyperlipidemia, unspecified: Secondary | ICD-10-CM | POA: Diagnosis not present

## 2020-09-02 DIAGNOSIS — E119 Type 2 diabetes mellitus without complications: Secondary | ICD-10-CM | POA: Diagnosis not present

## 2020-09-02 DIAGNOSIS — E1169 Type 2 diabetes mellitus with other specified complication: Secondary | ICD-10-CM | POA: Diagnosis not present

## 2020-09-02 DIAGNOSIS — N3943 Post-void dribbling: Secondary | ICD-10-CM | POA: Diagnosis not present

## 2020-09-02 MED ORDER — INSULIN GLARGINE 100 UNIT/ML ~~LOC~~ SOLN: 5.0000 [IU] | Freq: Every day | SUBCUTANEOUS | 11 refills | Status: DC

## 2020-09-02 MED ORDER — SITAGLIPTIN PHOSPHATE 50 MG PO TABS: 50.0000 mg | ORAL_TABLET | Freq: Every day | ORAL | 3 refills | Status: DC

## 2020-09-02 NOTE — Progress Notes (Signed)
Ht _0  (1.778 m)   Wt 264 lb 6.4 oz (119.9 kg)   BMI 37.94 kg/m    Subjective:    Patient ID: Justin Griffith, male    DOB: 1940-08-03, 80 y.o.   MRN: [id number]  Chief Complaint  Patient presents with   Hypertension   Diabetes   Hyperlipidemia   Coronary Artery Disease   COPD   Gastroesophageal Reflux   Benign Prostatic Hypertrophy   Can not maintain balance   Tingle sensation  in legs    Does not seem to go away. Seeing [name] on Monday next week.    HPI: Justin Griffith is a 80 y.o. male  Hypertension This is a chronic problem. The problem is controlled. Pertinent negatives include no anxiety, blurred vision, chest pain, headaches, malaise/fatigue, neck pain, orthopnea, palpitations, peripheral edema, PND, shortness of breath or sweats.  Diabetes He presents for his follow-up diabetic visit. He has type 2 diabetes mellitus. Pertinent negatives for hypoglycemia include no headaches or sweats. Pertinent negatives for diabetes include no blurred vision, no chest pain, no foot ulcerations, no polydipsia, no visual change and no weakness.  Hyperlipidemia This is a chronic problem. The problem is controlled. Pertinent negatives include no chest pain or shortness of breath.  Coronary Artery Disease Pertinent negatives include no chest pain, palpitations or shortness of breath. Risk factors include hyperlipidemia and hypertension.  COPD There is no shortness of breath. Pertinent negatives include no chest pain, headaches, malaise/fatigue, PND or sweats. His past medical history is significant for COPD.  Gastroesophageal Reflux He reports no chest pain.  Benign Prostatic Hypertrophy   Chief Complaint  Patient presents with   Hypertension   Diabetes   Hyperlipidemia   Coronary Artery Disease   COPD   Gastroesophageal Reflux   Benign Prostatic Hypertrophy   Can not maintain balance   Tingle sensation  in legs    Does not seem to go away. Seeing [name] on Monday  next week.    Relevant past medical, surgical, family and social history reviewed and updated as indicated. Interim medical history since our last visit reviewed. Allergies and medications reviewed and updated.  Review of Systems  Constitutional:  Negative for malaise/fatigue.  Eyes:  Negative for blurred vision.  Respiratory:  Negative for shortness of breath.   Cardiovascular:  Negative for chest pain, palpitations, orthopnea and PND.  Endocrine: Negative for polydipsia.  Musculoskeletal:  Negative for neck pain.  Neurological:  Negative for weakness and headaches.   Per HPI unless specifically indicated above     Objective:    Ht _1  (1.778 m)   Wt 264 lb 6.4 oz (119.9 kg)   BMI 37.94 kg/m   Wt Readings from Last 3 Encounters:  09/02/20 264 lb 6.4 oz (119.9 kg)  08/30/20 256 lb (116.1 kg)  07/18/20 261 lb 6.4 oz (118.6 kg)    Physical Exam  Results for orders placed or performed in visit on 08/22/20  Microalbumin, Urine Waived  Result Value Ref Range   Microalb, Ur Waived 150 (H) 0 - 19 mg/L   Creatinine, Urine Waived 100 10 - 300 mg/dL   Microalb/Creat Ratio >300 (H) <30 mg/g  Lipid panel  Result Value Ref Range   Cholesterol, Total 114 100 - 199 mg/dL   Triglycerides 247 (H) 0 - 149 mg/dL   HDL 32 (L) >39 mg/dL   VLDL Cholesterol Cal 39 5 - 40 mg/dL   LDL Chol Calc (NIH) 43 0 -  99 mg/dL   Chol/HDL Ratio 3.6 0.0 - 5.0 ratio  Bayer DCA Hb A1c Waived  Result Value Ref Range   HB A1C (BAYER DCA - WAIVED) 6.6 <7.0 %  Thyroid Panel With TSH  Result Value Ref Range   TSH 3.300 0.450 - 4.500 uIU/mL   T4, Total 6.3 4.5 - 12.0 ug/dL   T3 Uptake Ratio 28 24 - 39 %   Free Thyroxine Index 1.8 1.2 - 4.9  CBC with Differential/Platelet  Result Value Ref Range   WBC 5.9 3.4 - 10.8 x10E3/uL   RBC 4.35 4.14 - 5.80 x10E6/uL   Hemoglobin 14.0 13.0 - 17.7 g/dL   Hematocrit 41.0 37.5 - 51.0 %   MCV 94 79 - 97 fL   MCH 32.2 26.6 - 33.0 pg   MCHC 34.1 31.5 - 35.7 g/dL    RDW 12.9 11.6 - 15.4 %   Platelets 197 150 - 450 x10E3/uL   Neutrophils 64 Not Estab. %   Lymphs 23 Not Estab. %   Monocytes 8 Not Estab. %   Eos 4 Not Estab. %   Basos 1 Not Estab. %   Neutrophils Absolute 3.7 1.4 - 7.0 x10E3/uL   Lymphocytes Absolute 1.4 0.7 - 3.1 x10E3/uL   Monocytes Absolute 0.5 0.1 - 0.9 x10E3/uL   EOS (ABSOLUTE) 0.3 0.0 - 0.4 x10E3/uL   Basophils Absolute 0.1 0.0 - 0.2 x10E3/uL   Immature Granulocytes 0 Not Estab. %   Immature Grans (Abs) 0.0 0.0 - 0.1 x10E3/uL  Comprehensive metabolic panel  Result Value Ref Range   Glucose 115 (H) 65 - 99 mg/dL   BUN 18 8 - 27 mg/dL   Creatinine, Ser 0.94 0.76 - 1.27 mg/dL   eGFR 82 >59 mL/min/1.73   BUN/Creatinine Ratio 19 10 - 24   Sodium 144 134 - 144 mmol/L   Potassium 4.4 3.5 - 5.2 mmol/L   Chloride 103 96 - 106 mmol/L   CO2 24 20 - 29 mmol/L   Calcium 10.5 (H) 8.6 - 10.2 mg/dL   Total Protein 6.5 6.0 - 8.5 g/dL   Albumin 4.0 3.7 - 4.7 g/dL   Globulin, Total 2.5 1.5 - 4.5 g/dL   Albumin/Globulin Ratio 1.6 1.2 - 2.2   Bilirubin Total 0.4 0.0 - 1.2 mg/dL   Alkaline Phosphatase 46 44 - 121 IU/L   AST 10 0 - 40 IU/L   ALT 10 0 - 44 IU/L        Current Outpatient Medications:    acyclovir (ZOVIRAX) 400 MG tablet, Take 400 mg by mouth 2 (two) times daily., Disp: , Rfl:    aspirin EC 81 MG EC tablet, Take 1 tablet (81 mg total) by mouth daily. Swallow whole., Disp: 30 tablet, Rfl: 0   azelastine (ASTELIN) 0.1 % nasal spray, Place 2 sprays into both nostrils 2 (two) times daily., Disp: , Rfl:    bacitracin-neomycin-polymyxin-hydrocortisone (CORTISPORIN) 1 % ophthalmic ointment, Place 1 application into both eyes 2 (two) times daily., Disp: 3.5 g, Rfl: 0   carvedilol (COREG) 12.5 MG tablet, Take 2 tablets (25 mg total) by mouth 2 (two) times daily with a meal., Disp: 120 tablet, Rfl: 4   clobetasol cream (TEMOVATE) 9.93 %, Apply 1 application topically 2 (two) times daily. Apply to bilateral lower legs., Disp: 30 g,  Rfl: 1   clopidogrel (PLAVIX) 75 MG tablet, Take by mouth., Disp: , Rfl:    clotrimazole-betamethasone (LOTRISONE) cream, Apply 1 application topically 2 (two) times daily., Disp: 30 g,  Rfl: 0   donepezil (ARICEPT) 5 MG tablet, Take 1 tablet (5 mg total) by mouth at bedtime., Disp: 90 tablet, Rfl: 4   Fluticasone-Salmeterol (ADVAIR) 100-50 MCG/DOSE AEPB, Inhale 1 puff into the lungs 2 (two) times daily., Disp: 1 each, Rfl: 3   furosemide (LASIX) 20 MG tablet, Take 1 tablet (20 mg total) by mouth daily., Disp: 90 tablet, Rfl: 0   gabapentin (NEURONTIN) 300 MG capsule, Take 1 capsule by mouth 3 (three) times daily., Disp: , Rfl:    hydrochlorothiazide (HYDRODIURIL) 12.5 MG tablet, Take by mouth., Disp: , Rfl:    insulin glargine (LANTUS) 100 UNIT/ML injection, Inject 0.15 mLs (15 Units total) into the skin daily., Disp: 10 mL, Rfl: 11   ipratropium-albuterol (DUONEB) 0.5-2.5 (3) MG/3ML SOLN, Take 3 mLs by nebulization every 6 (six) hours., Disp: , Rfl:    isosorbide mononitrate (IMDUR) 30 MG 24 hr tablet, Take 1 tablet (30 mg total) by mouth daily., Disp: 30 tablet, Rfl: 0   meloxicam (MOBIC) 7.5 MG tablet, Take 7.5 mg by mouth as needed for pain., Disp: , Rfl:    metFORMIN (GLUCOPHAGE) 1000 MG tablet, Take 1,000 mg by mouth 2 (two) times daily with a meal., Disp: , Rfl:    mupirocin ointment (BACTROBAN) 2 %, Apply 1 application topically 2 (two) times daily. To open blisters bilateral legs., Disp: 22 g, Rfl: 0   olmesartan (BENICAR) 40 MG tablet, Take 1 tablet (40 mg total) by mouth daily., Disp: 90 tablet, Rfl: 4   primidone (MYSOLINE) 50 MG tablet, Take 1 tablet (50 mg total) by mouth 2 (two) times daily., Disp: 180 tablet, Rfl: 4   rosuvastatin (CRESTOR) 40 MG tablet, Take 1 tablet (40 mg total) by mouth at bedtime., Disp: 90 tablet, Rfl: 3   sacubitril-valsartan (ENTRESTO) 49-51 MG, Take 1 tablet by mouth every 12 (twelve) hours., Disp: , Rfl:    spironolactone (ALDACTONE) 25 MG tablet, Take by  mouth., Disp: , Rfl:    tamsulosin (FLOMAX) 0.4 MG CAPS capsule, Take 0.4 mg by mouth daily. Patient restarted on his own due to trouble urinating, Disp: , Rfl:    tamsulosin (FLOMAX) 0.4 MG CAPS capsule, Take 1 capsule (0.4 mg total) by mouth daily., Disp: 30 capsule, Rfl: 0   ticagrelor (BRILINTA) 90 MG TABS tablet, Take 1 tablet (90 mg total) by mouth 2 (two) times daily., Disp: 60 tablet, Rfl: 0   Tiotropium Bromide Monohydrate (SPIRIVA RESPIMAT) 2.5 MCG/ACT AERS, Inhale 2 puffs into the lungs daily., Disp: 1 g, Rfl: 3   vitamin B-12 (CYANOCOBALAMIN) 500 MCG tablet, Take 2,000 mcg by mouth at bedtime., Disp: , Rfl:    Vitamin D, Cholecalciferol, 25 MCG (1000 UT) CAPS, Take 2,000 mcg by mouth at bedtime., Disp: , Rfl:    cloNIDine (CATAPRES) 0.1 MG tablet, Take 1 tablet (0.1 mg total) by mouth 2 (two) times daily., Disp: 60 tablet, Rfl: 0    Assessment & Plan:  Dm  Reduce lantus to 5 untis. Add januvia  Pt has allergies to SGLts goal to take pt off of insulin check HbA1c,  urine  microalbumin  diabetic diet plan given to pt  adviced regarding hypoglycemia and instructions given to pt today on how to prevent and treat the same if it were to occur. pt acknowledges the plan and voices understanding of the same.  exercise plan given and encouraged.   advice diabetic yearly podiatry, ophthalmology , nutritionist , dental check q 6 months,   2. Lower ext numbness  and cannot maintain balance per pt. Sees [name] neurology for such fu and mx per such.  3. CAD - STEMI s/p stenting. Ho ischemic cardiomyopathy initial EF is around 30 to 35% now up to 50 % per cards  Continue Entresto Coreg Lasix spironolactone  4. COPD : stable, to fu with pulm. Sleep med for OSA, fu with [facility]   5. HLD TG higher, cut back on fatty food  recheck FLP, check LFT's work on diet, SE of meds explained to pt. low fat and high fiber diet explained to pt. Consider fenofibrate.    Problem List Items Addressed This Visit    None    Follow up plan: No follow-ups on file.

## 2020-09-04 DIAGNOSIS — E1159 Type 2 diabetes mellitus with other circulatory complications: Secondary | ICD-10-CM | POA: Diagnosis not present

## 2020-09-04 DIAGNOSIS — Z794 Long term (current) use of insulin: Secondary | ICD-10-CM | POA: Diagnosis not present

## 2020-09-04 DIAGNOSIS — E1142 Type 2 diabetes mellitus with diabetic polyneuropathy: Secondary | ICD-10-CM | POA: Diagnosis not present

## 2020-09-04 DIAGNOSIS — E1169 Type 2 diabetes mellitus with other specified complication: Secondary | ICD-10-CM | POA: Diagnosis not present

## 2020-09-04 DIAGNOSIS — E785 Hyperlipidemia, unspecified: Secondary | ICD-10-CM | POA: Diagnosis not present

## 2020-09-04 DIAGNOSIS — I152 Hypertension secondary to endocrine disorders: Secondary | ICD-10-CM | POA: Diagnosis not present

## 2020-10-01 ENCOUNTER — Telehealth: Payer: Self-pay | Admitting: General Practice

## 2020-10-01 ENCOUNTER — Telehealth: Payer: Medicare Other

## 2020-10-01 NOTE — Telephone Encounter (Signed)
  Care Management   Follow Up Note   10/01/2020 Name: Justin Griffith MRN: [id number] DOB: 28-Feb-1941   Referred by: Charlynne Cousins, MD Reason for referral : Chronic Care Management (RNCM: Follow up for Chronic Disease Management and Care coordination Needs )   An unsuccessful telephone outreach was attempted today. The patient was referred to the case management team for assistance with care management and care coordination.   Follow Up Plan: The care management team will reach out to the patient again over the next 30 to 60 days.   Noreene Larsson RN, MSN, [facility] Mobile: [phone]

## 2020-10-09 ENCOUNTER — Other Ambulatory Visit: Payer: Self-pay

## 2020-10-09 ENCOUNTER — Other Ambulatory Visit: Payer: PRIVATE HEALTH INSURANCE

## 2020-10-09 DIAGNOSIS — E1159 Type 2 diabetes mellitus with other circulatory complications: Secondary | ICD-10-CM | POA: Diagnosis not present

## 2020-10-09 DIAGNOSIS — E119 Type 2 diabetes mellitus without complications: Secondary | ICD-10-CM | POA: Diagnosis not present

## 2020-10-09 DIAGNOSIS — E1169 Type 2 diabetes mellitus with other specified complication: Secondary | ICD-10-CM | POA: Diagnosis not present

## 2020-10-09 DIAGNOSIS — E785 Hyperlipidemia, unspecified: Secondary | ICD-10-CM | POA: Diagnosis not present

## 2020-10-09 DIAGNOSIS — I152 Hypertension secondary to endocrine disorders: Secondary | ICD-10-CM

## 2020-10-09 LAB — BAYER DCA HB A1C WAIVED: HB A1C (BAYER DCA - WAIVED): 6.5 % (ref <7.0)

## 2020-10-10 ENCOUNTER — Telehealth: Payer: Self-pay

## 2020-10-10 LAB — CBC WITH DIFFERENTIAL/PLATELET
Basophils Absolute: 0.1 10*3/uL (ref 0.0–0.2)
Basos: 1 %
EOS (ABSOLUTE): 0.2 10*3/uL (ref 0.0–0.4)
Eos: 3 %
Hematocrit: 38.4 % (ref 37.5–51.0)
Hemoglobin: 12.9 g/dL — ABNORMAL LOW (ref 13.0–17.7)
Immature Grans (Abs): 0 10*3/uL (ref 0.0–0.1)
Immature Granulocytes: 0 %
Lymphocytes Absolute: 1.7 10*3/uL (ref 0.7–3.1)
Lymphs: 23 %
MCH: 31.3 pg (ref 26.6–33.0)
MCHC: 33.6 g/dL (ref 31.5–35.7)
MCV: 93 fL (ref 79–97)
Monocytes Absolute: 0.7 10*3/uL (ref 0.1–0.9)
Monocytes: 9 %
Neutrophils Absolute: 4.6 10*3/uL (ref 1.4–7.0)
Neutrophils: 64 %
Platelets: 218 10*3/uL (ref 150–450)
RBC: 4.12 x10E6/uL — ABNORMAL LOW (ref 4.14–5.80)
RDW: 12.7 % (ref 11.6–15.4)
WBC: 7.1 10*3/uL (ref 3.4–10.8)

## 2020-10-10 LAB — LIPID PANEL
Chol/HDL Ratio: 3.2 ratio (ref 0.0–5.0)
Cholesterol, Total: 90 mg/dL — ABNORMAL LOW (ref 100–199)
HDL: 28 mg/dL — ABNORMAL LOW (ref >39)
LDL Chol Calc (NIH): 30 mg/dL (ref 0–99)
Triglycerides: 196 mg/dL — ABNORMAL HIGH (ref 0–149)
VLDL Cholesterol Cal: 32 mg/dL (ref 5–40)

## 2020-10-10 NOTE — Chronic Care Management (AMB) (Signed)
  Care Management   Note  10/10/2020 Name: Justin Griffith MRN: [id number] DOB: 1940-11-08  Justin Griffith is a 80 y.o. year old male who is a primary care patient of Vigg, Avanti, MD and is actively engaged with the care management team. I reached out to [name] by phone today to assist with re-scheduling a follow up visit with the RN Case Manager  Follow up plan: Patient declines further follow up and engagement by the care management team. Appropriate care team members and provider have been notified via electronic communication.   Noreene Larsson, [place], [place], [place] [address] Direct Dial: [phone] [email or web address]: [email or web address]

## 2020-10-10 NOTE — Telephone Encounter (Signed)
Patient declined rescheduling f/u  

## 2020-10-15 DIAGNOSIS — H903 Sensorineural hearing loss, bilateral: Secondary | ICD-10-CM | POA: Diagnosis not present

## 2020-10-16 ENCOUNTER — Other Ambulatory Visit: Payer: Self-pay

## 2020-10-16 ENCOUNTER — Encounter: Payer: Self-pay | Admitting: Internal Medicine

## 2020-10-16 ENCOUNTER — Telehealth: Payer: Self-pay

## 2020-10-16 ENCOUNTER — Ambulatory Visit (INDEPENDENT_AMBULATORY_CARE_PROVIDER_SITE_OTHER): Payer: HMO | Admitting: Internal Medicine

## 2020-10-16 VITALS — BP 138/60 | HR 61 | Temp 99.2°F | Ht 70.08 in | Wt 258.0 lb

## 2020-10-16 DIAGNOSIS — E1169 Type 2 diabetes mellitus with other specified complication: Secondary | ICD-10-CM | POA: Diagnosis not present

## 2020-10-16 DIAGNOSIS — E785 Hyperlipidemia, unspecified: Secondary | ICD-10-CM | POA: Diagnosis not present

## 2020-10-16 DIAGNOSIS — Z1329 Encounter for screening for other suspected endocrine disorder: Secondary | ICD-10-CM | POA: Diagnosis not present

## 2020-10-16 DIAGNOSIS — E1129 Type 2 diabetes mellitus with other diabetic kidney complication: Secondary | ICD-10-CM

## 2020-10-16 DIAGNOSIS — I2510 Atherosclerotic heart disease of native coronary artery without angina pectoris: Secondary | ICD-10-CM | POA: Diagnosis not present

## 2020-10-16 DIAGNOSIS — E1159 Type 2 diabetes mellitus with other circulatory complications: Secondary | ICD-10-CM

## 2020-10-16 DIAGNOSIS — R350 Frequency of micturition: Secondary | ICD-10-CM | POA: Diagnosis not present

## 2020-10-16 DIAGNOSIS — N401 Enlarged prostate with lower urinary tract symptoms: Secondary | ICD-10-CM | POA: Diagnosis not present

## 2020-10-16 DIAGNOSIS — I152 Hypertension secondary to endocrine disorders: Secondary | ICD-10-CM

## 2020-10-16 DIAGNOSIS — R809 Proteinuria, unspecified: Secondary | ICD-10-CM

## 2020-10-16 MED ORDER — METFORMIN HCL 1000 MG PO TABS: 1000.0000 mg | ORAL_TABLET | Freq: Two times a day (BID) | ORAL | 3 refills | Status: DC

## 2020-10-16 MED ORDER — HYDROCORTISONE 2.5 % EX CREA: 1.0000 "application " | TOPICAL_CREAM | Freq: Two times a day (BID) | CUTANEOUS | 2 refills | Status: DC

## 2020-10-16 MED ORDER — MELOXICAM 7.5 MG PO TABS: 7.5000 mg | ORAL_TABLET | ORAL | 1 refills | Status: DC | PRN

## 2020-10-16 MED ORDER — TAMSULOSIN HCL 0.4 MG PO CAPS: 0.4000 mg | ORAL_CAPSULE | Freq: Every day | ORAL | 5 refills | Status: DC

## 2020-10-16 NOTE — Telephone Encounter (Signed)
Can we check and se eif he is on [place] please he wasn't sure and this message says hes not taking metformin either which he said he did please clarify

## 2020-10-16 NOTE — Progress Notes (Signed)
BP (!) 146/70   Pulse 64   Temp 99.2 F (37.3 C) (Oral)   Ht 5' 10.08" (1.78 m)   Wt 258 lb (117 kg)   PF 96 L/min   BMI 36.94 kg/m    Subjective:    Patient ID: Justin Griffith, male    DOB: December 24, 1940, 80 y.o.   MRN: [id number]  Chief Complaint  Patient presents with  . Hypertension  . Diabetes  . Chronic Kidney Disease  . Congestive Heart Failure  . Hyperlipidemia  . Knee Pain  . cereberal infarction  . Back Pain    Has been taking asa for pain, does not want to take a lot of asa.    HPI: Justin Griffith is a 80 y.o. male  Hypertension This is a chronic problem. The current episode started more than 1 month ago.  Diabetes He presents for his follow-up (improving, a1c at goal.  Patient is on metformin 1000 mg twice daily unsure if taking Januvia which is started at last visit patient to call the office and let us know which medications he is on.  He) diabetic visit. He has type 2 diabetes mellitus.  Congestive Heart Failure Pertinent negatives include no abdominal pain.  Hyperlipidemia  Back Pain This is a chronic problem. The current episode started more than 1 month ago. The problem is unchanged. The pain is present in the lumbar spine. The pain is moderate. Pertinent negatives include no abdominal pain, bladder incontinence, bowel incontinence, numbness, paresis or paresthesias.   Chief Complaint  Patient presents with  . Hypertension  . Diabetes  . Chronic Kidney Disease  . Congestive Heart Failure  . Hyperlipidemia  . Knee Pain  . cereberal infarction  . Back Pain    Has been taking asa for pain, does not want to take a lot of asa.    Relevant past medical, surgical, family and social history reviewed and updated as indicated. Interim medical history since our last visit reviewed. Allergies and medications reviewed and updated.  Review of Systems  Gastrointestinal:  Negative for abdominal pain and bowel incontinence.  Genitourinary:  Negative for  bladder incontinence.  Musculoskeletal:  Positive for back pain.  Neurological:  Negative for numbness and paresthesias.   Per HPI unless specifically indicated above     Objective:    BP (!) 146/70   Pulse 64   Temp 99.2 F (37.3 C) (Oral)   Ht 5' 10.08" (1.78 m)   Wt 258 lb (117 kg)   PF 96 L/min   BMI 36.94 kg/m   Wt Readings from Last 3 Encounters:  10/16/20 258 lb (117 kg)  09/04/20 264 lb 6.4 oz (119.9 kg)  08/30/20 256 lb (116.1 kg)    Physical Exam Vitals and nursing note reviewed.  Constitutional:      General: He is not in acute distress.    Appearance: Normal appearance. He is not ill-appearing or diaphoretic.  HENT:     Head: Normocephalic and atraumatic.     Right Ear: Tympanic membrane and external ear normal. There is no impacted cerumen.     Left Ear: External ear normal.     Nose: No congestion or rhinorrhea.     Mouth/Throat:     Pharynx: No oropharyngeal exudate or posterior oropharyngeal erythema.  Eyes:     Conjunctiva/sclera: Conjunctivae normal.     Pupils: Pupils are equal, round, and reactive to light.  Cardiovascular:     Rate and Rhythm: Normal rate and  regular rhythm.     Heart sounds: No murmur heard.   No friction rub. No gallop.  Pulmonary:     Effort: No respiratory distress.     Breath sounds: No stridor. No wheezing or rhonchi.  Chest:     Chest wall: No tenderness.  Abdominal:     General: Abdomen is flat. Bowel sounds are normal.     Palpations: Abdomen is soft. There is no mass.     Tenderness: There is no abdominal tenderness.  Musculoskeletal:        General: Tenderness present.     Cervical back: Normal range of motion and neck supple. No rigidity or tenderness.     Left lower leg: No edema.  Skin:    General: Skin is warm and dry.  Neurological:     Mental Status: He is alert.   Results for orders placed or performed in visit on 10/09/20  Lipid panel  Result Value Ref Range   Cholesterol, Total 90 (L) 100 - 199  mg/dL   Triglycerides 196 (H) 0 - 149 mg/dL   HDL 28 (L) >39 mg/dL   VLDL Cholesterol Cal 32 5 - 40 mg/dL   LDL Chol Calc (NIH) 30 0 - 99 mg/dL   Chol/HDL Ratio 3.2 0.0 - 5.0 ratio  Bayer DCA Hb A1c Waived  Result Value Ref Range   HB A1C (BAYER DCA - WAIVED) 6.5 <7.0 %  CBC with Differential/Platelet  Result Value Ref Range   WBC 7.1 3.4 - 10.8 x10E3/uL   RBC 4.12 (L) 4.14 - 5.80 x10E6/uL   Hemoglobin 12.9 (L) 13.0 - 17.7 g/dL   Hematocrit 38.4 37.5 - 51.0 %   MCV 93 79 - 97 fL   MCH 31.3 26.6 - 33.0 pg   MCHC 33.6 31.5 - 35.7 g/dL   RDW 12.7 11.6 - 15.4 %   Platelets 218 150 - 450 x10E3/uL   Neutrophils 64 Not Estab. %   Lymphs 23 Not Estab. %   Monocytes 9 Not Estab. %   Eos 3 Not Estab. %   Basos 1 Not Estab. %   Neutrophils Absolute 4.6 1.4 - 7.0 x10E3/uL   Lymphocytes Absolute 1.7 0.7 - 3.1 x10E3/uL   Monocytes Absolute 0.7 0.1 - 0.9 x10E3/uL   EOS (ABSOLUTE) 0.2 0.0 - 0.4 x10E3/uL   Basophils Absolute 0.1 0.0 - 0.2 x10E3/uL   Immature Granulocytes 0 Not Estab. %   Immature Grans (Abs) 0.0 0.0 - 0.1 x10E3/uL        Current Outpatient Medications:  .  acyclovir (ZOVIRAX) 400 MG tablet, Take 400 mg by mouth 2 (two) times daily., Disp: , Rfl:  .  aspirin EC 81 MG EC tablet, Take 1 tablet (81 mg total) by mouth daily. Swallow whole., Disp: 30 tablet, Rfl: 0 .  bacitracin-neomycin-polymyxin-hydrocortisone (CORTISPORIN) 1 % ophthalmic ointment, Place 1 application into both eyes 2 (two) times daily., Disp: 3.5 g, Rfl: 0 .  carvedilol (COREG) 12.5 MG tablet, Take 2 tablets (25 mg total) by mouth 2 (two) times daily with a meal., Disp: 120 tablet, Rfl: 4 .  clopidogrel (PLAVIX) 75 MG tablet, Take by mouth., Disp: , Rfl:  .  clotrimazole-betamethasone (LOTRISONE) cream, Apply 1 application topically 2 (two) times daily., Disp: 30 g, Rfl: 0 .  donepezil (ARICEPT) 5 MG tablet, Take 1 tablet (5 mg total) by mouth at bedtime., Disp: 90 tablet, Rfl: 4 .  Fluticasone-Salmeterol  (ADVAIR) 100-50 MCG/DOSE AEPB, Inhale 1 puff into the lungs 2 (  two) times daily., Disp: 1 each, Rfl: 3 .  furosemide (LASIX) 20 MG tablet, Take 1 tablet (20 mg total) by mouth daily., Disp: 90 tablet, Rfl: 0 .  gabapentin (NEURONTIN) 300 MG capsule, Take 1 capsule by mouth 3 (three) times daily., Disp: , Rfl:  .  hydrochlorothiazide (HYDRODIURIL) 12.5 MG tablet, Take by mouth., Disp: , Rfl:  .  hydrocortisone 2.5 % cream, Apply 1 application topically 2 (two) times daily., Disp: 30 g, Rfl: 2 .  ipratropium-albuterol (DUONEB) 0.5-2.5 (3) MG/3ML SOLN, Take 3 mLs by nebulization every 6 (six) hours., Disp: , Rfl:  .  isosorbide mononitrate (IMDUR) 30 MG 24 hr tablet, Take 1 tablet (30 mg total) by mouth daily., Disp: 30 tablet, Rfl: 0 .  metFORMIN (GLUCOPHAGE) 1000 MG tablet, Take 1,000 mg by mouth 2 (two) times daily with a meal., Disp: , Rfl:  .  mupirocin ointment (BACTROBAN) 2 %, Apply 1 application topically 2 (two) times daily. To open blisters bilateral legs., Disp: 22 g, Rfl: 0 .  olmesartan (BENICAR) 40 MG tablet, Take 1 tablet (40 mg total) by mouth daily., Disp: 90 tablet, Rfl: 4 .  primidone (MYSOLINE) 50 MG tablet, Take 1 tablet (50 mg total) by mouth 2 (two) times daily., Disp: 180 tablet, Rfl: 4 .  rosuvastatin (CRESTOR) 40 MG tablet, Take 1 tablet (40 mg total) by mouth at bedtime., Disp: 90 tablet, Rfl: 3 .  sacubitril-valsartan (ENTRESTO) 49-51 MG, Take 1 tablet by mouth every 12 (twelve) hours., Disp: , Rfl:  .  sitaGLIPtin (JANUVIA) 50 MG tablet, Take 1 tablet (50 mg total) by mouth daily., Disp: 30 tablet, Rfl: 3 .  spironolactone (ALDACTONE) 25 MG tablet, Take by mouth., Disp: , Rfl:  .  tamsulosin (FLOMAX) 0.4 MG CAPS capsule, Take 1 capsule (0.4 mg total) by mouth daily., Disp: 30 capsule, Rfl: 0 .  ticagrelor (BRILINTA) 90 MG TABS tablet, Take 1 tablet (90 mg total) by mouth 2 (two) times daily., Disp: 60 tablet, Rfl: 0 .  Tiotropium Bromide Monohydrate (SPIRIVA RESPIMAT) 2.5  MCG/ACT AERS, Inhale 2 puffs into the lungs daily., Disp: 1 g, Rfl: 3 .  vitamin B-12 (CYANOCOBALAMIN) 500 MCG tablet, Take 2,000 mcg by mouth at bedtime., Disp: , Rfl:  .  Vitamin D, Cholecalciferol, 25 MCG (1000 UT) CAPS, Take 2,000 mcg by mouth at bedtime., Disp: , Rfl:  .  cloNIDine (CATAPRES) 0.1 MG tablet, Take 1 tablet (0.1 mg total) by mouth 2 (two) times daily., Disp: 60 tablet, Rfl: 0 .  meloxicam (MOBIC) 7.5 MG tablet, Take 1 tablet (7.5 mg total) by mouth as needed for pain., Disp: 30 tablet, Rfl: 1 .  tamsulosin (FLOMAX) 0.4 MG CAPS capsule, Take 1 capsule (0.4 mg total) by mouth daily. Patient restarted on his own due to trouble urinating, Disp: 30 capsule, Rfl: 5    Assessment & Plan:  DM  a1c - better at 6.5 stopped in[place]lin in march only on januvia and metfromin check HbA1c,  urine  microalbumin  diabetic diet plan given to pt  adviced regarding hypoglycemia and instructions given to pt today on how to prevent and treat the same if it were to occur. pt acknowledges the plan and voices understanding of the same.  exercise plan given and encouraged.   advice diabetic yearly podiatry, ophthalmology , nutritionist , dental check q 6 months, To bring all his medications to the office at every office visit his medications and any refills that he might need  2. Obesity Lifestyle  modifications advised to pt. A1c at 6.5  Portion control and avoiding high carb low fat diet advised.  Diet plan given to pt   exercise plan given and encouraged.  To increase exercise to 150 mins a week ie 21/2 hours a week. Pt verbalises understanding of the above.   3. HTG :  Is on  recheck FLP, check LFT's work on diet, SE of meds explained to pt. low fat and high fiber diet explained to pt.  4. Back pain : start pt on mobic.   probably secondary to muscle spasms relieved with ibuprofen as prescribed in the ER UA negative no UTI patient does not have any symptoms either. most likely musckulskelteal  though adviced streches for back   5. HTN : / chf : Follow-up and management per cardiology is on Lasix, Entresto is on Coreg , clonidine , HCTZ, losartan Continue current meds.  Medication compliance emphasised. pt advised to keep Bp logs. Pt verbalised understanding of the same. Pt to have a low salt diet . Exercise to reach a goal of at least 150 mins a week.  lifestyle modifications explained and pt understands importance of the above.  Problem List Items Addressed This Visit   None    No orders of the defined types were placed in this encounter.   Meds ordered this encounter  Medications  . tamsulosin (FLOMAX) 0.4 MG CAPS capsule    Sig: Take 1 capsule (0.4 mg total) by mouth daily. Patient restarted on his own due to trouble urinating    Dispense:  30 capsule    Refill:  5  . hydrocortisone 2.5 % cream    Sig: Apply 1 application topically 2 (two) times daily.    Dispense:  30 g    Refill:  2  . meloxicam (MOBIC) 7.5 MG tablet    Sig: Take 1 tablet (7.5 mg total) by mouth as needed for pain.    Dispense:  30 tablet    Refill:  1  . metFORMIN (GLUCOPHAGE) 1000 MG tablet    Sig: Take 1 tablet (1,000 mg total) by mouth 2 (two) times daily with a meal.    Dispense:  180 tablet    Refill:  3     Follow up plan: No follow-ups on file.

## 2020-10-16 NOTE — Patient Instructions (Signed)

## 2020-10-16 NOTE — Telephone Encounter (Signed)
Copied from [place] [id number]. Topic: General - Other >> Oct 16, 2020 11:41 AM Valere Dross wrote: Reason for CRM: Pt called in stating PCP wanted to know what medications hes currently not taking and he called back with these. sitaGLIPtin (JANUVIA) 50 MG tablet  metFORMIN (GLUCOPHAGE) 1000 MG tablet

## 2020-10-18 NOTE — Telephone Encounter (Signed)
PT VERBALIZED UNDERSTANDING HAS APT ON 10/21/2020

## 2020-10-21 ENCOUNTER — Ambulatory Visit: Payer: PRIVATE HEALTH INSURANCE

## 2020-10-21 ENCOUNTER — Telehealth: Payer: Self-pay | Admitting: Internal Medicine

## 2020-10-21 ENCOUNTER — Other Ambulatory Visit: Payer: Self-pay

## 2020-10-21 DIAGNOSIS — Z79899 Other long term (current) drug therapy: Secondary | ICD-10-CM

## 2020-10-21 DIAGNOSIS — H903 Sensorineural hearing loss, bilateral: Secondary | ICD-10-CM | POA: Diagnosis not present

## 2020-10-21 NOTE — Progress Notes (Signed)
Patient presents today for a medication review. Patient has brought in today all the medications that he is currently taking. This is  to verify what meds that he is taking per Fr. Vigg

## 2020-10-21 NOTE — Telephone Encounter (Signed)
Awesome! Thank you!

## 2020-10-21 NOTE — Telephone Encounter (Signed)
Justin Griffith PTA with [facility] is calling to report the pt had 2 falls on 10/14/2020 patient fell backward on side walk while trying to go up the stairs and second fall pt was going down the front porch and went down one step and loss his balance. Pt had assist getting up . Pt reports no dizziness and just loss his balance. Please advise

## 2020-10-21 NOTE — Telephone Encounter (Signed)
Spoke with patient and notified him of Dr.Vigg's recommendations. Patient states he has not had any more falls since that day 10/14/20. Patient states he did have some dizzy spells and he has an upcoming appointment with Dr.Potter (Neurologist). Patient states he will hold off for now and use his best judgment on whether or not he needs to go to the ER, as he has not had any more episode or cardiac issues.

## 2020-10-21 NOTE — Telephone Encounter (Signed)
He will need to go the ER if he has more falls, also have him speak to his cardiologist as he has had multiple cardiac issues. I had seen him on 7/27 and he didn't report the fall! Nor did the pt send a message up until now.

## 2020-10-21 NOTE — Telephone Encounter (Signed)
Routing to provider to notify of message from [facility]. Please see message below regarding recent patient falls.

## 2020-10-30 DIAGNOSIS — G4733 Obstructive sleep apnea (adult) (pediatric): Secondary | ICD-10-CM | POA: Diagnosis not present

## 2020-10-30 DIAGNOSIS — G250 Essential tremor: Secondary | ICD-10-CM | POA: Diagnosis not present

## 2020-10-30 DIAGNOSIS — I739 Peripheral vascular disease, unspecified: Secondary | ICD-10-CM | POA: Diagnosis not present

## 2020-10-30 DIAGNOSIS — I252 Old myocardial infarction: Secondary | ICD-10-CM | POA: Diagnosis not present

## 2020-10-30 DIAGNOSIS — Z8673 Personal history of transient ischemic attack (TIA), and cerebral infarction without residual deficits: Secondary | ICD-10-CM | POA: Diagnosis not present

## 2020-10-30 DIAGNOSIS — I429 Cardiomyopathy, unspecified: Secondary | ICD-10-CM | POA: Diagnosis not present

## 2020-10-30 DIAGNOSIS — E1142 Type 2 diabetes mellitus with diabetic polyneuropathy: Secondary | ICD-10-CM | POA: Diagnosis not present

## 2020-10-30 DIAGNOSIS — Z9989 Dependence on other enabling machines and devices: Secondary | ICD-10-CM | POA: Diagnosis not present

## 2020-10-30 DIAGNOSIS — Z955 Presence of coronary angioplasty implant and graft: Secondary | ICD-10-CM | POA: Diagnosis not present

## 2020-10-30 DIAGNOSIS — R29898 Other symptoms and signs involving the musculoskeletal system: Secondary | ICD-10-CM | POA: Diagnosis not present

## 2020-10-30 DIAGNOSIS — I509 Heart failure, unspecified: Secondary | ICD-10-CM | POA: Diagnosis not present

## 2020-10-30 DIAGNOSIS — I10 Essential (primary) hypertension: Secondary | ICD-10-CM | POA: Diagnosis not present

## 2020-10-30 DIAGNOSIS — R296 Repeated falls: Secondary | ICD-10-CM | POA: Diagnosis not present

## 2020-10-30 DIAGNOSIS — I639 Cerebral infarction, unspecified: Secondary | ICD-10-CM | POA: Diagnosis not present

## 2020-10-30 DIAGNOSIS — E669 Obesity, unspecified: Secondary | ICD-10-CM | POA: Diagnosis not present

## 2020-10-30 DIAGNOSIS — R262 Difficulty in walking, not elsewhere classified: Secondary | ICD-10-CM | POA: Diagnosis not present

## 2020-10-30 DIAGNOSIS — R0602 Shortness of breath: Secondary | ICD-10-CM | POA: Diagnosis not present

## 2020-10-30 DIAGNOSIS — E782 Mixed hyperlipidemia: Secondary | ICD-10-CM | POA: Diagnosis not present

## 2020-11-07 ENCOUNTER — Telehealth: Payer: Self-pay | Admitting: Internal Medicine

## 2020-11-07 DIAGNOSIS — E785 Hyperlipidemia, unspecified: Secondary | ICD-10-CM | POA: Diagnosis not present

## 2020-11-07 DIAGNOSIS — Z85038 Personal history of other malignant neoplasm of large intestine: Secondary | ICD-10-CM | POA: Diagnosis not present

## 2020-11-07 DIAGNOSIS — Z7902 Long term (current) use of antithrombotics/antiplatelets: Secondary | ICD-10-CM | POA: Diagnosis not present

## 2020-11-07 DIAGNOSIS — Z7984 Long term (current) use of oral hypoglycemic drugs: Secondary | ICD-10-CM | POA: Diagnosis not present

## 2020-11-07 DIAGNOSIS — I130 Hypertensive heart and chronic kidney disease with heart failure and stage 1 through stage 4 chronic kidney disease, or unspecified chronic kidney disease: Secondary | ICD-10-CM | POA: Diagnosis not present

## 2020-11-07 DIAGNOSIS — E1122 Type 2 diabetes mellitus with diabetic chronic kidney disease: Secondary | ICD-10-CM | POA: Diagnosis not present

## 2020-11-07 DIAGNOSIS — Z8673 Personal history of transient ischemic attack (TIA), and cerebral infarction without residual deficits: Secondary | ICD-10-CM | POA: Diagnosis not present

## 2020-11-07 DIAGNOSIS — G4733 Obstructive sleep apnea (adult) (pediatric): Secondary | ICD-10-CM | POA: Diagnosis not present

## 2020-11-07 DIAGNOSIS — N189 Chronic kidney disease, unspecified: Secondary | ICD-10-CM | POA: Diagnosis not present

## 2020-11-07 DIAGNOSIS — Z9049 Acquired absence of other specified parts of digestive tract: Secondary | ICD-10-CM | POA: Diagnosis not present

## 2020-11-07 DIAGNOSIS — Z7982 Long term (current) use of aspirin: Secondary | ICD-10-CM | POA: Diagnosis not present

## 2020-11-07 DIAGNOSIS — E669 Obesity, unspecified: Secondary | ICD-10-CM | POA: Diagnosis not present

## 2020-11-07 DIAGNOSIS — Z96651 Presence of right artificial knee joint: Secondary | ICD-10-CM | POA: Diagnosis not present

## 2020-11-07 DIAGNOSIS — M1990 Unspecified osteoarthritis, unspecified site: Secondary | ICD-10-CM | POA: Diagnosis not present

## 2020-11-07 DIAGNOSIS — I509 Heart failure, unspecified: Secondary | ICD-10-CM | POA: Diagnosis not present

## 2020-11-07 DIAGNOSIS — Z9181 History of falling: Secondary | ICD-10-CM | POA: Diagnosis not present

## 2020-11-07 DIAGNOSIS — J449 Chronic obstructive pulmonary disease, unspecified: Secondary | ICD-10-CM | POA: Diagnosis not present

## 2020-11-07 DIAGNOSIS — Z87891 Personal history of nicotine dependence: Secondary | ICD-10-CM | POA: Diagnosis not present

## 2020-11-07 NOTE — Telephone Encounter (Signed)
OK for verbal orders?

## 2020-11-07 NOTE — Telephone Encounter (Signed)
Spoke with Waldon Reining and provided her with OK for verbal orders per Dr.Johnson. Claiborne Billings verbalized understanding and has no further questions.

## 2020-11-07 NOTE — Telephone Encounter (Signed)
Home Health Verbal Orders - Caller/Agency: Waldon Reining / [facility]  Callback Number: [phone] / vm can be left Requesting PT Frequency: 2x's a week for 4 weeks  and  1x a week for 4 weeks

## 2020-11-13 ENCOUNTER — Telehealth: Payer: Self-pay

## 2020-11-13 NOTE — Telephone Encounter (Signed)
PA for Home Health, Physical Therapy and Home Health Social Worker has been approved for Number of visit "1" and good through 11/07/20 to 01/05/21

## 2020-11-18 ENCOUNTER — Telehealth: Payer: Self-pay

## 2020-11-18 NOTE — Telephone Encounter (Signed)
Noted. Do they want patient seen? If so please schedule appointment.

## 2020-11-18 NOTE — Telephone Encounter (Signed)
Spoke with Tamvi and she states patient does not need an appointment as she examined him after the fall and states the patient did not hit his head or anything, and she just wanted to let the office know. Advised Tamvi to inform patient if he has any concerns or questions to give our office a call back. Verbalized understanding.

## 2020-11-18 NOTE — Telephone Encounter (Signed)
Copied from [place] [id number]. Topic: General - Other >> Nov 18, 2020  9:48 AM Tessa Lerner A wrote: Reason for CRM: Tamvi with [facility] has called to report that patient had a fall this morning 11/18/20 at approx 3 AM  The patient fell in their bathroom as they were attempting to pick something up, they lose their balance and fell forward   The patient has a bruise on their right forearm   The patient shared with Tamvi that they had concerns related to breathlessness after the fall  The patient is also concerned with their medications and their affect on patient's using the restroom  The patient's BP was 150/80 at 9:40 AM which is higher than their normal   Please contact Tamvi or the patient further if needed   Routing to providers in office to advise on call report.

## 2020-11-26 DIAGNOSIS — H903 Sensorineural hearing loss, bilateral: Secondary | ICD-10-CM | POA: Diagnosis not present

## 2020-11-28 DIAGNOSIS — R29898 Other symptoms and signs involving the musculoskeletal system: Secondary | ICD-10-CM | POA: Diagnosis not present

## 2020-11-28 DIAGNOSIS — R296 Repeated falls: Secondary | ICD-10-CM | POA: Diagnosis not present

## 2020-11-28 DIAGNOSIS — Z9989 Dependence on other enabling machines and devices: Secondary | ICD-10-CM | POA: Diagnosis not present

## 2020-11-28 DIAGNOSIS — G250 Essential tremor: Secondary | ICD-10-CM | POA: Diagnosis not present

## 2020-11-28 DIAGNOSIS — G4733 Obstructive sleep apnea (adult) (pediatric): Secondary | ICD-10-CM | POA: Diagnosis not present

## 2020-11-28 DIAGNOSIS — R262 Difficulty in walking, not elsewhere classified: Secondary | ICD-10-CM | POA: Diagnosis not present

## 2020-11-28 DIAGNOSIS — Z8673 Personal history of transient ischemic attack (TIA), and cerebral infarction without residual deficits: Secondary | ICD-10-CM | POA: Diagnosis not present

## 2020-12-02 DIAGNOSIS — G4733 Obstructive sleep apnea (adult) (pediatric): Secondary | ICD-10-CM | POA: Diagnosis not present

## 2020-12-02 DIAGNOSIS — I10 Essential (primary) hypertension: Secondary | ICD-10-CM | POA: Diagnosis not present

## 2020-12-02 DIAGNOSIS — I739 Peripheral vascular disease, unspecified: Secondary | ICD-10-CM | POA: Diagnosis not present

## 2020-12-02 DIAGNOSIS — I252 Old myocardial infarction: Secondary | ICD-10-CM | POA: Diagnosis not present

## 2020-12-02 DIAGNOSIS — I639 Cerebral infarction, unspecified: Secondary | ICD-10-CM | POA: Diagnosis not present

## 2020-12-02 DIAGNOSIS — E669 Obesity, unspecified: Secondary | ICD-10-CM | POA: Diagnosis not present

## 2020-12-02 DIAGNOSIS — J449 Chronic obstructive pulmonary disease, unspecified: Secondary | ICD-10-CM | POA: Diagnosis not present

## 2020-12-02 DIAGNOSIS — E1142 Type 2 diabetes mellitus with diabetic polyneuropathy: Secondary | ICD-10-CM | POA: Diagnosis not present

## 2020-12-02 DIAGNOSIS — R0602 Shortness of breath: Secondary | ICD-10-CM | POA: Diagnosis not present

## 2020-12-02 DIAGNOSIS — I509 Heart failure, unspecified: Secondary | ICD-10-CM | POA: Diagnosis not present

## 2020-12-02 DIAGNOSIS — E782 Mixed hyperlipidemia: Secondary | ICD-10-CM | POA: Diagnosis not present

## 2020-12-02 DIAGNOSIS — I429 Cardiomyopathy, unspecified: Secondary | ICD-10-CM | POA: Diagnosis not present

## 2020-12-05 DIAGNOSIS — E1140 Type 2 diabetes mellitus with diabetic neuropathy, unspecified: Secondary | ICD-10-CM | POA: Diagnosis not present

## 2020-12-05 DIAGNOSIS — B351 Tinea unguium: Secondary | ICD-10-CM | POA: Diagnosis not present

## 2020-12-05 DIAGNOSIS — Z794 Long term (current) use of insulin: Secondary | ICD-10-CM | POA: Diagnosis not present

## 2021-01-02 DIAGNOSIS — J439 Emphysema, unspecified: Secondary | ICD-10-CM | POA: Diagnosis not present

## 2021-01-02 DIAGNOSIS — G4733 Obstructive sleep apnea (adult) (pediatric): Secondary | ICD-10-CM | POA: Diagnosis not present

## 2021-01-02 DIAGNOSIS — R0609 Other forms of dyspnea: Secondary | ICD-10-CM | POA: Diagnosis not present

## 2021-01-09 ENCOUNTER — Other Ambulatory Visit: Payer: PRIVATE HEALTH INSURANCE

## 2021-01-09 ENCOUNTER — Other Ambulatory Visit: Payer: Self-pay

## 2021-01-09 DIAGNOSIS — R809 Proteinuria, unspecified: Secondary | ICD-10-CM | POA: Diagnosis not present

## 2021-01-09 DIAGNOSIS — I2510 Atherosclerotic heart disease of native coronary artery without angina pectoris: Secondary | ICD-10-CM | POA: Diagnosis not present

## 2021-01-09 DIAGNOSIS — R350 Frequency of micturition: Secondary | ICD-10-CM | POA: Diagnosis not present

## 2021-01-09 DIAGNOSIS — I152 Hypertension secondary to endocrine disorders: Secondary | ICD-10-CM | POA: Diagnosis not present

## 2021-01-09 DIAGNOSIS — E785 Hyperlipidemia, unspecified: Secondary | ICD-10-CM | POA: Diagnosis not present

## 2021-01-09 DIAGNOSIS — E1129 Type 2 diabetes mellitus with other diabetic kidney complication: Secondary | ICD-10-CM | POA: Diagnosis not present

## 2021-01-09 DIAGNOSIS — E1169 Type 2 diabetes mellitus with other specified complication: Secondary | ICD-10-CM

## 2021-01-09 DIAGNOSIS — E1159 Type 2 diabetes mellitus with other circulatory complications: Secondary | ICD-10-CM | POA: Diagnosis not present

## 2021-01-09 DIAGNOSIS — N401 Enlarged prostate with lower urinary tract symptoms: Secondary | ICD-10-CM | POA: Diagnosis not present

## 2021-01-09 DIAGNOSIS — Z1329 Encounter for screening for other suspected endocrine disorder: Secondary | ICD-10-CM

## 2021-01-09 LAB — BAYER DCA HB A1C WAIVED: HB A1C (BAYER DCA - WAIVED): 7.2 % — ABNORMAL HIGH (ref 4.8–5.6)

## 2021-01-10 LAB — CMP14+EGFR
ALT: 6 IU/L (ref 0–44)
AST: 8 IU/L (ref 0–40)
Albumin/Globulin Ratio: 1.6 (ref 1.2–2.2)
Albumin: 4.2 g/dL (ref 3.6–4.6)
Alkaline Phosphatase: 64 IU/L (ref 44–121)
BUN/Creatinine Ratio: 14 (ref 10–24)
BUN: 15 mg/dL (ref 8–27)
Bilirubin Total: 0.4 mg/dL (ref 0.0–1.2)
CO2: 23 mmol/L (ref 20–29)
Calcium: 10.2 mg/dL (ref 8.6–10.2)
Chloride: 100 mmol/L (ref 96–106)
Creatinine, Ser: 1.04 mg/dL (ref 0.76–1.27)
Globulin, Total: 2.7 g/dL (ref 1.5–4.5)
Glucose: 202 mg/dL — ABNORMAL HIGH (ref 70–99)
Potassium: 4.3 mmol/L (ref 3.5–5.2)
Sodium: 138 mmol/L (ref 134–144)
Total Protein: 6.9 g/dL (ref 6.0–8.5)
eGFR: 72 mL/min/{1.73_m2} (ref >59)

## 2021-01-10 LAB — PSA TOTAL+% FREE (SERIAL)
PSA, Free Pct: 34.8 %
PSA, Free: 0.87 ng/mL
Prostate Specific Ag, Serum: 2.5 ng/mL (ref 0.0–4.0)

## 2021-01-10 LAB — CBC WITH DIFFERENTIAL/PLATELET
Basophils Absolute: 0.1 10*3/uL (ref 0.0–0.2)
Basos: 1 %
EOS (ABSOLUTE): 0.4 10*3/uL (ref 0.0–0.4)
Eos: 6 %
Hematocrit: 38.7 % (ref 37.5–51.0)
Hemoglobin: 12.9 g/dL — ABNORMAL LOW (ref 13.0–17.7)
Immature Grans (Abs): 0 10*3/uL (ref 0.0–0.1)
Immature Granulocytes: 1 %
Lymphocytes Absolute: 1.4 10*3/uL (ref 0.7–3.1)
Lymphs: 22 %
MCH: 29.8 pg (ref 26.6–33.0)
MCHC: 33.3 g/dL (ref 31.5–35.7)
MCV: 89 fL (ref 79–97)
Monocytes Absolute: 0.6 10*3/uL (ref 0.1–0.9)
Monocytes: 9 %
Neutrophils Absolute: 4 10*3/uL (ref 1.4–7.0)
Neutrophils: 61 %
Platelets: 232 10*3/uL (ref 150–450)
RBC: 4.33 x10E6/uL (ref 4.14–5.80)
RDW: 12.2 % (ref 11.6–15.4)
WBC: 6.5 10*3/uL (ref 3.4–10.8)

## 2021-01-10 LAB — THYROID PANEL WITH TSH
Free Thyroxine Index: 2 (ref 1.2–4.9)
T3 Uptake Ratio: 28 % (ref 24–39)
T4, Total: 7.3 ug/dL (ref 4.5–12.0)
TSH: 4.36 u[IU]/mL (ref 0.450–4.500)

## 2021-01-10 LAB — LIPID PANEL
Chol/HDL Ratio: 3.5 ratio (ref 0.0–5.0)
Cholesterol, Total: 101 mg/dL (ref 100–199)
HDL: 29 mg/dL — ABNORMAL LOW (ref >39)
LDL Chol Calc (NIH): 37 mg/dL (ref 0–99)
Triglycerides: 223 mg/dL — ABNORMAL HIGH (ref 0–149)
VLDL Cholesterol Cal: 35 mg/dL (ref 5–40)

## 2021-01-15 DIAGNOSIS — Z961 Presence of intraocular lens: Secondary | ICD-10-CM | POA: Diagnosis not present

## 2021-01-15 DIAGNOSIS — E113293 Type 2 diabetes mellitus with mild nonproliferative diabetic retinopathy without macular edema, bilateral: Secondary | ICD-10-CM | POA: Diagnosis not present

## 2021-01-15 DIAGNOSIS — H5712 Ocular pain, left eye: Secondary | ICD-10-CM | POA: Diagnosis not present

## 2021-01-15 DIAGNOSIS — H35373 Puckering of macula, bilateral: Secondary | ICD-10-CM | POA: Diagnosis not present

## 2021-01-15 LAB — HM DIABETES EYE EXAM

## 2021-01-16 ENCOUNTER — Other Ambulatory Visit: Payer: Self-pay

## 2021-01-16 ENCOUNTER — Ambulatory Visit (INDEPENDENT_AMBULATORY_CARE_PROVIDER_SITE_OTHER): Payer: HMO | Admitting: Internal Medicine

## 2021-01-16 ENCOUNTER — Encounter: Payer: Self-pay | Admitting: Internal Medicine

## 2021-01-16 VITALS — BP 130/72 | HR 59 | Temp 98.6°F | Ht 70.0 in | Wt 266.0 lb

## 2021-01-16 DIAGNOSIS — E1169 Type 2 diabetes mellitus with other specified complication: Secondary | ICD-10-CM | POA: Diagnosis not present

## 2021-01-16 DIAGNOSIS — E785 Hyperlipidemia, unspecified: Secondary | ICD-10-CM | POA: Diagnosis not present

## 2021-01-16 DIAGNOSIS — E1129 Type 2 diabetes mellitus with other diabetic kidney complication: Secondary | ICD-10-CM

## 2021-01-16 DIAGNOSIS — R809 Proteinuria, unspecified: Secondary | ICD-10-CM | POA: Diagnosis not present

## 2021-01-16 DIAGNOSIS — G8929 Other chronic pain: Secondary | ICD-10-CM | POA: Diagnosis not present

## 2021-01-16 DIAGNOSIS — M545 Low back pain, unspecified: Secondary | ICD-10-CM

## 2021-01-16 DIAGNOSIS — Z6841 Body Mass Index (BMI) 40.0 and over, adult: Secondary | ICD-10-CM | POA: Diagnosis not present

## 2021-01-16 MED ORDER — FLUTICASONE-SALMETEROL 100-50 MCG/ACT IN AEPB: 1.0000 | INHALATION_SPRAY | Freq: Two times a day (BID) | RESPIRATORY_TRACT | 3 refills | Status: DC

## 2021-01-16 NOTE — Progress Notes (Signed)
BP 130/72   Pulse (!) 59   Temp 98.6 F (37 C)   Ht _0  (1.778 m)   Wt 266 lb (120.7 kg)   SpO2 96%   BMI 38.17 kg/m    Subjective:    Patient ID: Justin Griffith, male    DOB: 03-Aug-1940, 80 y.o.   MRN: [id number]  Chief Complaint  Patient presents with  . Hyperlipidemia  . Hypertension  . Diabetes  . COPD  . Coronary Artery Disease    HPI: Justin Griffith is a 80 y.o. male  Hyperlipidemia This is a chronic problem. The current episode started more than 1 year ago. Pertinent negatives include no shortness of breath.  Hypertension This is a chronic problem. The current episode started more than 1 month ago. Pertinent negatives include no headaches, palpitations or shortness of breath.  Diabetes He presents for his follow-up (a1c higher at 7.2 patient has been noncompliant with his diet.  On metformin 1000 mg twice daily) diabetic visit. He has type 2 diabetes mellitus. Pertinent negatives for hypoglycemia include no headaches.  COPD There is no cough, hoarse voice, shortness of breath or sputum production. Pertinent negatives include no headaches. His past medical history is significant for COPD.  Coronary Artery Disease Presents for follow-up visit. Pertinent negatives include no muscle weakness, palpitations or shortness of breath. Risk factors include hyperlipidemia and hypertension.   Chief Complaint  Patient presents with  . Hyperlipidemia  . Hypertension  . Diabetes  . COPD  . Coronary Artery Disease    Relevant past medical, surgical, family and social history reviewed and updated as indicated. Interim medical history since our last visit reviewed. Allergies and medications reviewed and updated.  Review of Systems  HENT:  Negative for hoarse voice.   Respiratory:  Negative for cough, sputum production and shortness of breath.   Cardiovascular:  Negative for palpitations.  Musculoskeletal:  Negative for muscle weakness.  Neurological:  Negative for  headaches.   Per HPI unless specifically indicated above     Objective:    BP 130/72   Pulse (!) 59   Temp 98.6 F (37 C)   Ht _1  (1.778 m)   Wt 266 lb (120.7 kg)   SpO2 96%   BMI 38.17 kg/m   Wt Readings from Last 3 Encounters:  01/16/21 266 lb (120.7 kg)  10/16/20 258 lb (117 kg)  09/04/20 264 lb 6.4 oz (119.9 kg)    Physical Exam Vitals and nursing note reviewed.  Constitutional:      General: He is not in acute distress.    Appearance: Normal appearance. He is not ill-appearing or diaphoretic.  HENT:     Head: Normocephalic and atraumatic.     Right Ear: Tympanic membrane and external ear normal. There is no impacted cerumen.     Left Ear: External ear normal.     Nose: No congestion or rhinorrhea.     Mouth/Throat:     Pharynx: No oropharyngeal exudate or posterior oropharyngeal erythema.  Eyes:     Conjunctiva/sclera: Conjunctivae normal.     Pupils: Pupils are equal, round, and reactive to light.  Cardiovascular:     Rate and Rhythm: Normal rate and regular rhythm.     Heart sounds: No murmur heard.   No friction rub. No gallop.  Pulmonary:     Effort: No respiratory distress.     Breath sounds: No stridor. No wheezing or rhonchi.  Chest:     Chest wall:  No tenderness.  Abdominal:     General: Abdomen is flat. Bowel sounds are normal.     Palpations: Abdomen is soft. There is no mass.     Tenderness: There is no abdominal tenderness.  Musculoskeletal:     Cervical back: Normal range of motion and neck supple. No rigidity or tenderness.     Left lower leg: No edema.  Skin:    General: Skin is warm and dry.  Neurological:     Mental Status: He is alert.    Results for orders placed or performed in visit on 01/09/21  CMP14+EGFR  Result Value Ref Range   Glucose 202 (H) 70 - 99 mg/dL   BUN 15 8 - 27 mg/dL   Creatinine, Ser 1.04 0.76 - 1.27 mg/dL   eGFR 72 >59 mL/min/1.73   BUN/Creatinine Ratio 14 10 - 24   Sodium 138 134 - 144 mmol/L    Potassium 4.3 3.5 - 5.2 mmol/L   Chloride 100 96 - 106 mmol/L   CO2 23 20 - 29 mmol/L   Calcium 10.2 8.6 - 10.2 mg/dL   Total Protein 6.9 6.0 - 8.5 g/dL   Albumin 4.2 3.6 - 4.6 g/dL   Globulin, Total 2.7 1.5 - 4.5 g/dL   Albumin/Globulin Ratio 1.6 1.2 - 2.2   Bilirubin Total 0.4 0.0 - 1.2 mg/dL   Alkaline Phosphatase 64 44 - 121 IU/L   AST 8 0 - 40 IU/L   ALT 6 0 - 44 IU/L  Lipid panel  Result Value Ref Range   Cholesterol, Total 101 100 - 199 mg/dL   Triglycerides 223 (H) 0 - 149 mg/dL   HDL 29 (L) >39 mg/dL   VLDL Cholesterol Cal 35 5 - 40 mg/dL   LDL Chol Calc (NIH) 37 0 - 99 mg/dL   Chol/HDL Ratio 3.5 0.0 - 5.0 ratio  Bayer DCA Hb A1c Waived  Result Value Ref Range   HB A1C (BAYER DCA - WAIVED) 7.2 (H) 4.8 - 5.6 %  Thyroid Panel With TSH  Result Value Ref Range   TSH 4.360 0.450 - 4.500 uIU/mL   T4, Total 7.3 4.5 - 12.0 ug/dL   T3 Uptake Ratio 28 24 - 39 %   Free Thyroxine Index 2.0 1.2 - 4.9  CBC with Differential/Platelet  Result Value Ref Range   WBC 6.5 3.4 - 10.8 x10E3/uL   RBC 4.33 4.14 - 5.80 x10E6/uL   Hemoglobin 12.9 (L) 13.0 - 17.7 g/dL   Hematocrit 38.7 37.5 - 51.0 %   MCV 89 79 - 97 fL   MCH 29.8 26.6 - 33.0 pg   MCHC 33.3 31.5 - 35.7 g/dL   RDW 12.2 11.6 - 15.4 %   Platelets 232 150 - 450 x10E3/uL   Neutrophils 61 Not Estab. %   Lymphs 22 Not Estab. %   Monocytes 9 Not Estab. %   Eos 6 Not Estab. %   Basos 1 Not Estab. %   Neutrophils Absolute 4.0 1.4 - 7.0 x10E3/uL   Lymphocytes Absolute 1.4 0.7 - 3.1 x10E3/uL   Monocytes Absolute 0.6 0.1 - 0.9 x10E3/uL   EOS (ABSOLUTE) 0.4 0.0 - 0.4 x10E3/uL   Basophils Absolute 0.1 0.0 - 0.2 x10E3/uL   Immature Granulocytes 1 Not Estab. %   Immature Grans (Abs) 0.0 0.0 - 0.1 x10E3/uL  PSA Total+%Free (Serial)  Result Value Ref Range   Prostate Specific Ag, Serum 2.5 0.0 - 4.0 ng/mL   PSA, Free 0.87 N/A ng/mL  PSA, Free Pct 34.8 %        Current Outpatient Medications:  .  acyclovir (ZOVIRAX) 400 MG  tablet, Take 400 mg by mouth 2 (two) times daily., Disp: , Rfl:  .  aspirin EC 81 MG EC tablet, Take 1 tablet (81 mg total) by mouth daily. Swallow whole., Disp: 30 tablet, Rfl: 0 .  carvedilol (COREG) 25 MG tablet, TAKE ONE TABLET BY MOUTH TWO TIMES A DAY FOR BLOOD PRESSURE, Disp: , Rfl:  .  cloNIDine (CATAPRES) 0.1 MG tablet, Take by mouth., Disp: , Rfl:  .  cloNIDine (CATAPRES) 0.2 MG tablet, Take 1 tablet by mouth 2 (two) times daily., Disp: , Rfl:  .  clotrimazole-betamethasone (LOTRISONE) cream, Apply 1 application topically 2 (two) times daily., Disp: 30 g, Rfl: 0 .  fluticasone-salmeterol (ADVAIR) 100-50 MCG/ACT AEPB, Inhale 1 puff into the lungs 2 (two) times daily., Disp: 1 each, Rfl: 3 .  Fluticasone-Salmeterol (ADVAIR) 100-50 MCG/DOSE AEPB, Inhale 1 puff into the lungs 2 (two) times daily., Disp: 1 each, Rfl: 3 .  furosemide (LASIX) 40 MG tablet, TAKE ONE TABLET BY MOUTH EVERY DAY FOR BLOOD PRESSURE AND FOR FLUID CONTROL, Disp: , Rfl:  .  gabapentin (NEURONTIN) 300 MG capsule, Take 1 capsule by mouth in the morning and at bedtime., Disp: , Rfl:  .  hydrALAZINE (APRESOLINE) 50 MG tablet, TAKE ONE TABLET BY MOUTH THREE TIMES A DAY FOR BLOOD PRESSURE, Disp: , Rfl:  .  losartan (COZAAR) 100 MG tablet, Take 100 mg by mouth daily., Disp: , Rfl:  .  metFORMIN (GLUCOPHAGE) 1000 MG tablet, Take 1 tablet (1,000 mg total) by mouth 2 (two) times daily with a meal., Disp: 180 tablet, Rfl: 3 .  rosuvastatin (CRESTOR) 40 MG tablet, Take 1 tablet (40 mg total) by mouth at bedtime., Disp: 90 tablet, Rfl: 3 .  tamsulosin (FLOMAX) 0.4 MG CAPS capsule, Take 1 capsule (0.4 mg total) by mouth daily. Patient restarted on his own due to trouble urinating, Disp: 30 capsule, Rfl: 5 .  Tiotropium Bromide Monohydrate (SPIRIVA RESPIMAT) 2.5 MCG/ACT AERS, Inhale 2 puffs into the lungs daily., Disp: 1 g, Rfl: 3 .  vitamin B-12 (CYANOCOBALAMIN) 500 MCG tablet, Take 2,000 mcg by mouth at bedtime., Disp: , Rfl:  .   Vitamin D, Cholecalciferol, 25 MCG (1000 UT) CAPS, Take 2,000 mcg by mouth at bedtime., Disp: , Rfl:  .  clopidogrel (PLAVIX) 75 MG tablet, Take by mouth., Disp: , Rfl:    Xray of the lumbar spine : IMPRESSION: 1. Lumbar degenerative disc and facet disease, as above. 2. No definitive source of right radiculopathy. Neural impingement is greatest at the right L4-5 foramen. Assessment & Plan:  HLD :  TG worse, LDL at goal. Is on crestor 40 mg   Ref. Range 10/09/2020 09:53 01/09/2021 10:36 01/09/2021 10:40  Total CHOL/HDL Ratio Latest Ref Range: 0.0 - 5.0 ratio 3.2  3.5  Cholesterol, Total Latest Ref Range: 100 - 199 mg/dL 90 (L)  101  HDL Cholesterol Latest Ref Range: >39 mg/dL 28 (L)  29 (L)  Triglycerides Latest Ref Range: 0 - 149 mg/dL 196 (H)  223 (H)  VLDL Cholesterol Cal Latest Ref Range: 5 - 40 mg/dL 32  35  LDL Chol Calc (NIH) Latest Ref Range: 0 - 99 mg/dL 30  37    2. DM :  A1c. 7.2 now, is on metformin 500 mg bid for such   urine  microalbumin  diabetic diet plan given to pt  adviced regarding hypoglycemia and instructions given to pt today on how to prevent and treat the same if it were to occur. pt acknowledges the plan and voices understanding of the same.  exercise plan given and encouraged.   advice diabetic yearly podiatry, ophthalmology , nutritionist , dental check q 6 months,  3. Obesity weight up 8 lbs since last visit.   4. COPD is on advair , ? Spiriva.  Sees dr/ flemming, mx per such  Not on O2 curently.   5. OA in back wil recheck  Has right leg neuropathy  - is on gabapentin 300 mg bid for such   sees [name] for such  Falls are better per pt.  Walking with a walker  Last fall about 1 month ago.   Ho CVA stable, is on plavix.   Problem List Items Addressed This Visit       Endocrine   Type 2 diabetes mellitus with proteinuria (HCC)   Hyperlipidemia associated with type 2 diabetes mellitus ([place]) - Primary   Relevant Medications   carvedilol  (COREG) 25 MG tablet   furosemide (LASIX) 40 MG tablet   cloNIDine (CATAPRES) 0.2 MG tablet   hydrALAZINE (APRESOLINE) 50 MG tablet     Other   Back pain   Relevant Orders   DG Lumbar Spine Complete   BMI 40.0-44.9, adult ([place])   Morbid obesity ([place])     Orders Placed This Encounter  Procedures  . DG Lumbar Spine Complete     Meds ordered this encounter  Medications  . fluticasone-salmeterol (ADVAIR) 100-50 MCG/ACT AEPB    Sig: Inhale 1 puff into the lungs 2 (two) times daily.    Dispense:  1 each    Refill:  3     Follow up plan: No follow-ups on file.

## 2021-01-21 ENCOUNTER — Ambulatory Visit
Admission: RE | Admit: 2021-01-21 | Discharge: 2021-01-21 | Disposition: A | Discharge status: Home / Self Care | Payer: HMO | Attending: Internal Medicine | Admitting: Internal Medicine

## 2021-01-21 ENCOUNTER — Ambulatory Visit
Admission: RE | Admit: 2021-01-21 | Discharge: 2021-01-21 | Disposition: A | Discharge status: Home / Self Care | Payer: HMO | Source: Ambulatory Visit | Attending: Internal Medicine | Admitting: Internal Medicine

## 2021-01-21 ENCOUNTER — Other Ambulatory Visit: Payer: Self-pay

## 2021-01-21 DIAGNOSIS — M2578 Osteophyte, vertebrae: Secondary | ICD-10-CM | POA: Diagnosis not present

## 2021-01-21 DIAGNOSIS — M545 Low back pain, unspecified: Secondary | ICD-10-CM | POA: Diagnosis not present

## 2021-01-21 DIAGNOSIS — G8929 Other chronic pain: Secondary | ICD-10-CM | POA: Diagnosis not present

## 2021-01-21 DIAGNOSIS — M5136 Other intervertebral disc degeneration, lumbar region: Secondary | ICD-10-CM | POA: Diagnosis not present

## 2021-01-21 NOTE — Progress Notes (Signed)
Pt has mulilevel DJD of the L spine , needs to see ortho for such please refer.

## 2021-01-21 NOTE — Addendum Note (Signed)
Addended by: Charlynne Cousins on: 01/21/2021 11:19 AM   Modules accepted: Orders

## 2021-01-22 ENCOUNTER — Ambulatory Visit: Payer: Self-pay | Admitting: *Deleted

## 2021-01-22 DIAGNOSIS — Z9181 History of falling: Secondary | ICD-10-CM | POA: Diagnosis not present

## 2021-01-22 DIAGNOSIS — E669 Obesity, unspecified: Secondary | ICD-10-CM | POA: Diagnosis not present

## 2021-01-22 DIAGNOSIS — E785 Hyperlipidemia, unspecified: Secondary | ICD-10-CM | POA: Diagnosis not present

## 2021-01-22 DIAGNOSIS — Z7984 Long term (current) use of oral hypoglycemic drugs: Secondary | ICD-10-CM | POA: Diagnosis not present

## 2021-01-22 DIAGNOSIS — E1122 Type 2 diabetes mellitus with diabetic chronic kidney disease: Secondary | ICD-10-CM | POA: Diagnosis not present

## 2021-01-22 DIAGNOSIS — J449 Chronic obstructive pulmonary disease, unspecified: Secondary | ICD-10-CM | POA: Diagnosis not present

## 2021-01-22 DIAGNOSIS — I130 Hypertensive heart and chronic kidney disease with heart failure and stage 1 through stage 4 chronic kidney disease, or unspecified chronic kidney disease: Secondary | ICD-10-CM | POA: Diagnosis not present

## 2021-01-22 DIAGNOSIS — Z85038 Personal history of other malignant neoplasm of large intestine: Secondary | ICD-10-CM | POA: Diagnosis not present

## 2021-01-22 DIAGNOSIS — I509 Heart failure, unspecified: Secondary | ICD-10-CM | POA: Diagnosis not present

## 2021-01-22 DIAGNOSIS — Z9049 Acquired absence of other specified parts of digestive tract: Secondary | ICD-10-CM | POA: Diagnosis not present

## 2021-01-22 DIAGNOSIS — M1990 Unspecified osteoarthritis, unspecified site: Secondary | ICD-10-CM | POA: Diagnosis not present

## 2021-01-22 DIAGNOSIS — Z7902 Long term (current) use of antithrombotics/antiplatelets: Secondary | ICD-10-CM | POA: Diagnosis not present

## 2021-01-22 DIAGNOSIS — Z7982 Long term (current) use of aspirin: Secondary | ICD-10-CM | POA: Diagnosis not present

## 2021-01-22 DIAGNOSIS — Z96651 Presence of right artificial knee joint: Secondary | ICD-10-CM | POA: Diagnosis not present

## 2021-01-22 DIAGNOSIS — Z87891 Personal history of nicotine dependence: Secondary | ICD-10-CM | POA: Diagnosis not present

## 2021-01-22 DIAGNOSIS — G4733 Obstructive sleep apnea (adult) (pediatric): Secondary | ICD-10-CM | POA: Diagnosis not present

## 2021-01-22 DIAGNOSIS — N189 Chronic kidney disease, unspecified: Secondary | ICD-10-CM | POA: Diagnosis not present

## 2021-01-22 DIAGNOSIS — Z8673 Personal history of transient ischemic attack (TIA), and cerebral infarction without residual deficits: Secondary | ICD-10-CM | POA: Diagnosis not present

## 2021-01-22 NOTE — Telephone Encounter (Signed)
PTA with [facility] 'Shaun' calliing to report pt had fallen yesterday and again this AM. States pt reported "Neuropathy worse." States pt denied dizziness, VSS, "Right knee gave out when turning to right." States pt ambulated 90 ft with him without issues. States " RIght leg seems to buckle with pressure ."Advised NT would call pt for further triage.  Able to reach pt. States fell yesterday and this AM, no injuries. States had to call EMS to assist in getting off floor. Again denies any injuries.  Denies numbness, states "Just that right leg buckles, pain at knee." States "I'm sure it's my neuropathy." States has since been using walker in home. Pt denies dizziness. Pt states he sees [name] who manages his neuropathy next week. States he would like to discuss with him at that visit. Assured NT would route to Dr. Neomia Dear for review as well. Reason for Disposition  [1] Recent fall AND [2] no injury  Answer Assessment - Initial Assessment Questions 1. MECHANISM: "How did the fall happen?"      Please see triage summary. CAll from PTA 2. DOMESTIC VIOLENCE AND ELDER ABUSE SCREENING: "Did you fall because someone pushed you or tried to hurt you?" If Yes, ask: "Are you safe now?"     *No Answer* 3. ONSET: "When did the fall happen?" (e.g., minutes, hours, or days ago)     *No Answer* 4. LOCATION: "What part of the body hit the ground?" (e.g., back, buttocks, head, hips, knees, hands, head, stomach)     *No Answer* 5. INJURY: "Did you hurt (injure) yourself when you fell?" If Yes, ask: "What did you injure? Tell me more about this?" (e.g., body area; type of injury; pain severity)"     *No Answer* 6. PAIN: "Is there any pain?" If Yes, ask: "How bad is the pain?" (e.g., Scale 1-10; or mild,  moderate, severe)   - NONE (0): No pain   - MILD (1-3): Doesn't interfere with normal activities    - MODERATE (4-7): Interferes with normal activities or awakens from sleep    - SEVERE (8-10): Excruciating pain,  unable to do any normal activities      *No Answer* 7. SIZE: For cuts, bruises, or swelling, ask: "How large is it?" (e.g., inches or centimeters)      *No Answer* 8. PREGNANCY: "Is there any chance you are pregnant?" "When was your last menstrual period?"     *No Answer* 9. OTHER SYMPTOMS: "Do you have any other symptoms?" (e.g., dizziness, fever, weakness; new onset or worsening).      *No Answer* 10. CAUSE: "What do you think caused the fall (or falling)?" (e.g., tripped, dizzy spell)       *No Answer*  Protocols used: Falls and [facility]

## 2021-01-23 DIAGNOSIS — H6983 Other specified disorders of Eustachian tube, bilateral: Secondary | ICD-10-CM | POA: Diagnosis not present

## 2021-01-23 DIAGNOSIS — R682 Dry mouth, unspecified: Secondary | ICD-10-CM | POA: Diagnosis not present

## 2021-01-23 DIAGNOSIS — H903 Sensorineural hearing loss, bilateral: Secondary | ICD-10-CM | POA: Diagnosis not present

## 2021-01-23 DIAGNOSIS — H6123 Impacted cerumen, bilateral: Secondary | ICD-10-CM | POA: Diagnosis not present

## 2021-01-28 DIAGNOSIS — R296 Repeated falls: Secondary | ICD-10-CM | POA: Diagnosis not present

## 2021-01-28 DIAGNOSIS — R29898 Other symptoms and signs involving the musculoskeletal system: Secondary | ICD-10-CM | POA: Diagnosis not present

## 2021-01-28 DIAGNOSIS — Z9989 Dependence on other enabling machines and devices: Secondary | ICD-10-CM | POA: Diagnosis not present

## 2021-01-28 DIAGNOSIS — Z8673 Personal history of transient ischemic attack (TIA), and cerebral infarction without residual deficits: Secondary | ICD-10-CM | POA: Diagnosis not present

## 2021-01-28 DIAGNOSIS — R262 Difficulty in walking, not elsewhere classified: Secondary | ICD-10-CM | POA: Diagnosis not present

## 2021-01-28 DIAGNOSIS — R519 Headache, unspecified: Secondary | ICD-10-CM | POA: Diagnosis not present

## 2021-01-28 DIAGNOSIS — G4733 Obstructive sleep apnea (adult) (pediatric): Secondary | ICD-10-CM | POA: Diagnosis not present

## 2021-01-28 DIAGNOSIS — G250 Essential tremor: Secondary | ICD-10-CM | POA: Diagnosis not present

## 2021-02-06 DIAGNOSIS — I509 Heart failure, unspecified: Secondary | ICD-10-CM | POA: Diagnosis not present

## 2021-02-06 DIAGNOSIS — Z9181 History of falling: Secondary | ICD-10-CM | POA: Diagnosis not present

## 2021-02-06 DIAGNOSIS — Z85038 Personal history of other malignant neoplasm of large intestine: Secondary | ICD-10-CM | POA: Diagnosis not present

## 2021-02-06 DIAGNOSIS — J449 Chronic obstructive pulmonary disease, unspecified: Secondary | ICD-10-CM | POA: Diagnosis not present

## 2021-02-06 DIAGNOSIS — Z9049 Acquired absence of other specified parts of digestive tract: Secondary | ICD-10-CM | POA: Diagnosis not present

## 2021-02-06 DIAGNOSIS — I130 Hypertensive heart and chronic kidney disease with heart failure and stage 1 through stage 4 chronic kidney disease, or unspecified chronic kidney disease: Secondary | ICD-10-CM | POA: Diagnosis not present

## 2021-02-06 DIAGNOSIS — E1122 Type 2 diabetes mellitus with diabetic chronic kidney disease: Secondary | ICD-10-CM | POA: Diagnosis not present

## 2021-02-06 DIAGNOSIS — E669 Obesity, unspecified: Secondary | ICD-10-CM | POA: Diagnosis not present

## 2021-02-06 DIAGNOSIS — M1990 Unspecified osteoarthritis, unspecified site: Secondary | ICD-10-CM | POA: Diagnosis not present

## 2021-02-06 DIAGNOSIS — Z87891 Personal history of nicotine dependence: Secondary | ICD-10-CM | POA: Diagnosis not present

## 2021-02-06 DIAGNOSIS — E785 Hyperlipidemia, unspecified: Secondary | ICD-10-CM | POA: Diagnosis not present

## 2021-02-06 DIAGNOSIS — Z7982 Long term (current) use of aspirin: Secondary | ICD-10-CM | POA: Diagnosis not present

## 2021-02-06 DIAGNOSIS — N189 Chronic kidney disease, unspecified: Secondary | ICD-10-CM | POA: Diagnosis not present

## 2021-02-06 DIAGNOSIS — G4733 Obstructive sleep apnea (adult) (pediatric): Secondary | ICD-10-CM | POA: Diagnosis not present

## 2021-02-06 DIAGNOSIS — Z7902 Long term (current) use of antithrombotics/antiplatelets: Secondary | ICD-10-CM | POA: Diagnosis not present

## 2021-02-06 DIAGNOSIS — Z8673 Personal history of transient ischemic attack (TIA), and cerebral infarction without residual deficits: Secondary | ICD-10-CM | POA: Diagnosis not present

## 2021-02-06 DIAGNOSIS — Z96651 Presence of right artificial knee joint: Secondary | ICD-10-CM | POA: Diagnosis not present

## 2021-02-06 DIAGNOSIS — Z7984 Long term (current) use of oral hypoglycemic drugs: Secondary | ICD-10-CM | POA: Diagnosis not present

## 2021-02-24 DIAGNOSIS — H04123 Dry eye syndrome of bilateral lacrimal glands: Secondary | ICD-10-CM | POA: Diagnosis not present

## 2021-02-24 DIAGNOSIS — H5712 Ocular pain, left eye: Secondary | ICD-10-CM | POA: Diagnosis not present

## 2021-02-26 DIAGNOSIS — J449 Chronic obstructive pulmonary disease, unspecified: Secondary | ICD-10-CM | POA: Diagnosis not present

## 2021-02-26 DIAGNOSIS — Z8673 Personal history of transient ischemic attack (TIA), and cerebral infarction without residual deficits: Secondary | ICD-10-CM | POA: Diagnosis not present

## 2021-02-26 DIAGNOSIS — Z7982 Long term (current) use of aspirin: Secondary | ICD-10-CM | POA: Diagnosis not present

## 2021-02-26 DIAGNOSIS — E785 Hyperlipidemia, unspecified: Secondary | ICD-10-CM | POA: Diagnosis not present

## 2021-02-26 DIAGNOSIS — Z96651 Presence of right artificial knee joint: Secondary | ICD-10-CM | POA: Diagnosis not present

## 2021-02-26 DIAGNOSIS — Z7984 Long term (current) use of oral hypoglycemic drugs: Secondary | ICD-10-CM | POA: Diagnosis not present

## 2021-02-26 DIAGNOSIS — Z85038 Personal history of other malignant neoplasm of large intestine: Secondary | ICD-10-CM | POA: Diagnosis not present

## 2021-02-26 DIAGNOSIS — Z9181 History of falling: Secondary | ICD-10-CM | POA: Diagnosis not present

## 2021-02-26 DIAGNOSIS — Z9049 Acquired absence of other specified parts of digestive tract: Secondary | ICD-10-CM | POA: Diagnosis not present

## 2021-02-26 DIAGNOSIS — N189 Chronic kidney disease, unspecified: Secondary | ICD-10-CM | POA: Diagnosis not present

## 2021-02-26 DIAGNOSIS — I130 Hypertensive heart and chronic kidney disease with heart failure and stage 1 through stage 4 chronic kidney disease, or unspecified chronic kidney disease: Secondary | ICD-10-CM | POA: Diagnosis not present

## 2021-02-26 DIAGNOSIS — E1122 Type 2 diabetes mellitus with diabetic chronic kidney disease: Secondary | ICD-10-CM | POA: Diagnosis not present

## 2021-02-26 DIAGNOSIS — Z87891 Personal history of nicotine dependence: Secondary | ICD-10-CM | POA: Diagnosis not present

## 2021-02-26 DIAGNOSIS — Z7902 Long term (current) use of antithrombotics/antiplatelets: Secondary | ICD-10-CM | POA: Diagnosis not present

## 2021-02-26 DIAGNOSIS — I509 Heart failure, unspecified: Secondary | ICD-10-CM | POA: Diagnosis not present

## 2021-02-26 DIAGNOSIS — M1990 Unspecified osteoarthritis, unspecified site: Secondary | ICD-10-CM | POA: Diagnosis not present

## 2021-02-26 DIAGNOSIS — G4733 Obstructive sleep apnea (adult) (pediatric): Secondary | ICD-10-CM | POA: Diagnosis not present

## 2021-02-26 DIAGNOSIS — E669 Obesity, unspecified: Secondary | ICD-10-CM | POA: Diagnosis not present

## 2021-02-27 DIAGNOSIS — G629 Polyneuropathy, unspecified: Secondary | ICD-10-CM | POA: Diagnosis not present

## 2021-02-27 DIAGNOSIS — G250 Essential tremor: Secondary | ICD-10-CM | POA: Diagnosis not present

## 2021-02-27 DIAGNOSIS — R296 Repeated falls: Secondary | ICD-10-CM | POA: Diagnosis not present

## 2021-02-27 DIAGNOSIS — G4733 Obstructive sleep apnea (adult) (pediatric): Secondary | ICD-10-CM | POA: Diagnosis not present

## 2021-02-27 DIAGNOSIS — Z8673 Personal history of transient ischemic attack (TIA), and cerebral infarction without residual deficits: Secondary | ICD-10-CM | POA: Diagnosis not present

## 2021-02-27 DIAGNOSIS — R519 Headache, unspecified: Secondary | ICD-10-CM | POA: Diagnosis not present

## 2021-02-27 DIAGNOSIS — Z9989 Dependence on other enabling machines and devices: Secondary | ICD-10-CM | POA: Diagnosis not present

## 2021-02-27 DIAGNOSIS — R29898 Other symptoms and signs involving the musculoskeletal system: Secondary | ICD-10-CM | POA: Diagnosis not present

## 2021-02-27 DIAGNOSIS — R262 Difficulty in walking, not elsewhere classified: Secondary | ICD-10-CM | POA: Diagnosis not present

## 2021-03-10 ENCOUNTER — Encounter (INDEPENDENT_AMBULATORY_CARE_PROVIDER_SITE_OTHER): Payer: Self-pay | Admitting: Vascular Surgery

## 2021-03-10 ENCOUNTER — Other Ambulatory Visit: Payer: Self-pay

## 2021-03-10 ENCOUNTER — Ambulatory Visit (INDEPENDENT_AMBULATORY_CARE_PROVIDER_SITE_OTHER): Payer: HMO | Admitting: Vascular Surgery

## 2021-03-10 VITALS — BP 125/60 | Ht 71.0 in | Wt 271.0 lb

## 2021-03-10 DIAGNOSIS — I2510 Atherosclerotic heart disease of native coronary artery without angina pectoris: Secondary | ICD-10-CM | POA: Diagnosis not present

## 2021-03-10 DIAGNOSIS — I152 Hypertension secondary to endocrine disorders: Secondary | ICD-10-CM | POA: Diagnosis not present

## 2021-03-10 DIAGNOSIS — E1159 Type 2 diabetes mellitus with other circulatory complications: Secondary | ICD-10-CM | POA: Diagnosis not present

## 2021-03-10 DIAGNOSIS — I872 Venous insufficiency (chronic) (peripheral): Secondary | ICD-10-CM | POA: Diagnosis not present

## 2021-03-10 DIAGNOSIS — R809 Proteinuria, unspecified: Secondary | ICD-10-CM

## 2021-03-10 DIAGNOSIS — E1129 Type 2 diabetes mellitus with other diabetic kidney complication: Secondary | ICD-10-CM

## 2021-03-10 DIAGNOSIS — I890 Lymphedema, not elsewhere classified: Secondary | ICD-10-CM

## 2021-03-10 NOTE — Progress Notes (Signed)
MRN : [id number]  Justin Griffith is a 80 y.o. (May 04, 1940) male who presents with chief complaint of leg swelling.  History of Present Illness:   Patient is seen for evaluation of leg swelling. The patient first noticed the swelling remotely but is now concerned because of a significant increase in the overall edema. The swelling is associated with pain and discoloration. The patient notes that in the morning the legs are significantly improved but they steadily worsened throughout the course of the day. Elevation makes the legs better, dependency makes them much worse.   There is no history of ulcerations associated with the swelling.   The patient denies any recent changes in their medications.  The patient has not been wearing graduated compression.  The patient has no had any past angiography, interventions or vascular surgery.  The patient denies a history of DVT or PE. There is no prior history of phlebitis. There is no history of primary lymphedema.  There is no history of radiation treatment to the groin or pelvis No history of malignancies. No history of trauma or groin or pelvic surgery. No history of foreign travel or parasitic infections area    Current Meds  Medication Sig   acyclovir (ZOVIRAX) 400 MG tablet Take 400 mg by mouth 2 (two) times daily.   aspirin EC 81 MG EC tablet Take 1 tablet (81 mg total) by mouth daily. Swallow whole.   carvedilol (COREG) 25 MG tablet TAKE ONE TABLET BY MOUTH TWO TIMES A DAY FOR BLOOD PRESSURE   cloNIDine (CATAPRES) 0.1 MG tablet Take by mouth.   cloNIDine (CATAPRES) 0.2 MG tablet Take 1 tablet by mouth 2 (two) times daily.   clopidogrel (PLAVIX) 75 MG tablet Take by mouth.   clotrimazole-betamethasone (LOTRISONE) cream Apply 1 application topically 2 (two) times daily.   fluticasone-salmeterol (ADVAIR) 100-50 MCG/ACT AEPB Inhale 1 puff into the lungs 2 (two) times daily.   Fluticasone-Salmeterol (ADVAIR) 100-50 MCG/DOSE AEPB  Inhale 1 puff into the lungs 2 (two) times daily.   furosemide (LASIX) 40 MG tablet TAKE ONE TABLET BY MOUTH EVERY DAY FOR BLOOD PRESSURE AND FOR FLUID CONTROL   gabapentin (NEURONTIN) 300 MG capsule Take 1 capsule by mouth in the morning and at bedtime.   hydrALAZINE (APRESOLINE) 50 MG tablet TAKE ONE TABLET BY MOUTH THREE TIMES A DAY FOR BLOOD PRESSURE   losartan (COZAAR) 100 MG tablet Take 100 mg by mouth daily.   metFORMIN (GLUCOPHAGE) 1000 MG tablet Take 1 tablet (1,000 mg total) by mouth 2 (two) times daily with a meal.   rosuvastatin (CRESTOR) 40 MG tablet Take 1 tablet (40 mg total) by mouth at bedtime.   tamsulosin (FLOMAX) 0.4 MG CAPS capsule Take 1 capsule (0.4 mg total) by mouth daily. Patient restarted on his own due to trouble urinating   Tiotropium Bromide Monohydrate (SPIRIVA RESPIMAT) 2.5 MCG/ACT AERS Inhale 2 puffs into the lungs daily.   vitamin B-12 (CYANOCOBALAMIN) 500 MCG tablet Take 2,000 mcg by mouth at bedtime.   Vitamin D, Cholecalciferol, 25 MCG (1000 UT) CAPS Take 2,000 mcg by mouth at bedtime.   [DISCONTINUED] gabapentin (NEURONTIN) 600 MG tablet Take by mouth.    Past Medical History:  Diagnosis Date   Arthritis    Asthma 2000   Back pain    Colon polyp 2012   COPD (chronic obstructive pulmonary disease) (HCC)    Emphysema of lung (HCC)    Heart murmur    Hernia 2000   Hyperlipidemia  Hypertension    Personal history of malignant neoplasm of large intestine    Personal history of tobacco use, presenting hazards to health    Sleep apnea    Special screening for malignant neoplasms, colon    Type 2 diabetes mellitus with proteinuria ([place]) 10/11/2014    Past Surgical History:  Procedure Laterality Date   CARDIAC CATHETERIZATION Left 09/25/2015   Procedure: Left Heart Cath and Coronary Angiography;  Surgeon: Yolonda Kida, MD;  Location: [facility];  Service: Cardiovascular;  Laterality: Left;   CHOLECYSTECTOMY  1970   COLON SURGERY   07-16-99   sigmoid colon resection with primary anastomosis, chemotherapy for metastatic disease   COLONOSCOPY  2001, 2012   Dr Bary Castilla, tubular adenoma of the cecum and ascending colon in 2012.   COLONOSCOPY WITH PROPOFOL N/A 02/12/2016   Procedure: COLONOSCOPY WITH PROPOFOL;  Surgeon: Robert Bellow, MD;  Location: [facility]COPY;  Service: Endoscopy;  Laterality: N/A;   CORONARY STENT INTERVENTION N/A 05/17/2020   Procedure: CORONARY STENT INTERVENTION;  Surgeon: Yolonda Kida, MD;  Location: [facility];  Service: Cardiovascular;  Laterality: N/A;  LAD   ESOPHAGOGASTRODUODENOSCOPY (EGD) WITH PROPOFOL N/A 02/12/2016   Procedure: ESOPHAGOGASTRODUODENOSCOPY (EGD) WITH PROPOFOL;  Surgeon: Robert Bellow, MD;  Location: [facility] ENDOSCOPY;  Service: Endoscopy;  Laterality: N/A;   HERNIA REPAIR Right    right inguinal hernia repair   JOINT REPLACEMENT Bilateral    knee replacement   KNEE SURGERY Bilateral 2010   LEFT HEART CATH AND CORONARY ANGIOGRAPHY N/A 05/17/2020   Procedure: LEFT HEART CATH AND CORONARY ANGIOGRAPHY possible PCI and stent;  Surgeon: Yolonda Kida, MD;  Location: [facility];  Service: Cardiovascular;  Laterality: N/A;   MYRINGOTOMY WITH TUBE PLACEMENT Bilateral 08/16/2014   Procedure: MYRINGOTOMY WITH TUBE PLACEMENT;  Surgeon: Carloyn Manner, MD;  Location: [facility];  Service: ENT;  Laterality: Bilateral;   PERIPHERAL VASCULAR CATHETERIZATION Right 09/04/2015   Procedure: Lower Extremity Angiography;  Surgeon: Katha Cabal, MD;  Location: [place];  Service: Cardiovascular;  Laterality: Right;   PERIPHERAL VASCULAR CATHETERIZATION  09/04/2015   Procedure: Lower Extremity Intervention;  Surgeon: Katha Cabal, MD;  Location: [facility];  Service: Cardiovascular;;   TONSILLECTOMY     TYMPANOSTOMY TUBE PLACEMENT      Social History Social History   Tobacco Use   Smoking status: Former    Packs/day: 1.00     Years: 30.00    Pack years: 30.00    Types: Cigarettes    Quit date: 03/23/1989    Years since quitting: 31.9   Smokeless tobacco: Never  Vaping Use   Vaping Use: Never used  Substance Use Topics   Alcohol use: No    Alcohol/week: 0.0 standard drinks   Drug use: No    Family History Family History  Problem Relation Age of Onset   Diabetes Father    Esophageal cancer Mother    Alzheimer's disease Paternal Uncle     Allergies  Allergen Reactions   Farxiga [Dapagliflozin] Rash    Causes a severe rash in groin area    Dulaglutide Diarrhea and Other (See Comments)   Liraglutide Diarrhea and Other (See Comments)   Jardiance [Empagliflozin] Rash     REVIEW OF SYSTEMS (Negative unless checked)  Constitutional: [] Weight loss  [] Fever  [] Chills Cardiac: [] Chest pain   [] Chest pressure   [] Palpitations   [] Shortness of breath when laying flat   [] Shortness of  breath with exertion. Vascular:  [] Pain in legs with walking   [] Pain in legs at rest  [] History of DVT   [] Phlebitis   [x] Swelling in legs   [] Varicose veins   [] Non-healing ulcers Pulmonary:   [] Uses home oxygen   [] Productive cough   [] Hemoptysis   [] Wheeze  [] COPD   [] Asthma Neurologic:  [] Dizziness   [] Seizures   [] History of stroke   [] History of TIA  [] Aphasia   [] Vissual changes   [] Weakness or numbness in arm   [] Weakness or numbness in leg Musculoskeletal:   [] Joint swelling   [] Joint pain   [] Low back pain Hematologic:  [] Easy bruising  [] Easy bleeding   [] Hypercoagulable state   [] Anemic Gastrointestinal:  [] Diarrhea   [] Vomiting  [] Gastroesophageal reflux/heartburn   [] Difficulty swallowing. Genitourinary:  [] Chronic kidney disease   [] Difficult urination  [] Frequent urination   [] Blood in urine Skin:  [] Rashes   [] Ulcers  Psychological:  [] History of anxiety   []  History of major depression.  Physical Examination  Vitals:   03/10/21 1439  BP: 125/60  Weight: 271 lb (122.9 kg)  Height: 5\' 11"  (1.803 m)    Body mass index is 37.8 kg/m. Gen: WD/WN, NAD Head: [place]/AT, No temporalis wasting.  Ear/Nose/Throat: Hearing grossly intact, nares w/o erythema or drainage, pinna without lesions Eyes: PER, EOMI, sclera nonicteric.  Neck: Supple, no gross masses.  No JVD.  Pulmonary:  Good air movement, no audible wheezing, no use of accessory muscles.  Cardiac: RRR, precordium not hyperdynamic. Vascular:  scattered varicosities present bilaterally.  Moderate venous stasis changes to the legs bilaterally.  3+ soft pitting edema  Vessel Right Left  Radial Palpable Palpable  Gastrointestinal: soft, non-distended. No guarding/no peritoneal signs.  Musculoskeletal: M/S 5/5 throughout.  No deformity.  Neurologic: CN 2-12 intact. Pain and light touch intact in extremities.  Symmetrical.  Speech is fluent. Motor exam as listed above. Psychiatric: Judgment intact, Mood & affect appropriate for pt's clinical situation. Dermatologic: Venous rashes no ulcers noted.  No changes consistent with cellulitis. Lymph : No lichenification or skin changes of chronic lymphedema.  CBC Lab Results  Component Value Date   WBC 6.5 01/09/2021   HGB 12.9 (L) 01/09/2021   HCT 38.7 01/09/2021   MCV 89 01/09/2021   PLT 232 01/09/2021    BMET    Component Value Date/Time   NA 138 01/09/2021 1040   K 4.3 01/09/2021 1040   CL 100 01/09/2021 1040   CO2 23 01/09/2021 1040   GLUCOSE 202 (H) 01/09/2021 1040   GLUCOSE 173 (H) 05/21/2020 0651   BUN 15 01/09/2021 1040   CREATININE 1.04 01/09/2021 1040   CALCIUM 10.2 01/09/2021 1040   GFRNONAA >60 05/21/2020 0651   GFRAA 75 04/17/2020 1512   CrCl cannot be calculated (Patient's most recent lab result is older than the maximum 21 days allowed.).  COAG Lab Results  Component Value Date   INR 1.1 05/16/2020   INR 1.2 03/28/2020   INR 1.2 01/26/2020    Radiology No results found.   Assessment/Plan 1. Lymphedema I have had a long discussion with the patient  regarding swelling and why it  causes symptoms.  Patient will begin wearing graduated compression stockings class 1 (20-30 mmHg) on a daily basis a prescription was given. The patient will  beginning wearing the stockings first thing in the morning and removing them in the evening. The patient is instructed specifically not to sleep in the stockings.   In addition, behavioral modification will  be initiated.  This will include frequent elevation, use of over the counter pain medications and exercise such as walking.  I have reviewed systemic causes for chronic edema such as liver, kidney and cardiac etiologies.  The patient denies problems with these organ systems.    Consideration for a lymph pump will also be made based upon the effectiveness of conservative therapy.  This would help to improve the edema control and prevent sequela such as ulcers and infections   Patient should undergo duplex ultrasound of the venous system to ensure that DVT or reflux is not present.  The patient will follow-up with me after the ultrasound.    2. Chronic venous insufficiency I have had a long discussion with the patient regarding swelling and why it  causes symptoms.  Patient will begin wearing graduated compression stockings class 1 (20-30 mmHg) on a daily basis a prescription was given. The patient will  beginning wearing the stockings first thing in the morning and removing them in the evening. The patient is instructed specifically not to sleep in the stockings.   In addition, behavioral modification will be initiated.  This will include frequent elevation, use of over the counter pain medications and exercise such as walking.  I have reviewed systemic causes for chronic edema such as liver, kidney and cardiac etiologies.  The patient denies problems with these organ systems.    Consideration for a lymph pump will also be made based upon the effectiveness of conservative therapy.  This would help to improve  the edema control and prevent sequela such as ulcers and infections   Patient should undergo duplex ultrasound of the venous system to ensure that DVT or reflux is not present.  The patient will follow-up with me after the ultrasound.    3. Coronary artery disease, unspecified vessel or lesion type, unspecified whether angina present, unspecified whether native or transplanted heart Continue cardiac and antihypertensive medications as already ordered and reviewed, no changes at this time.  Continue statin as ordered and reviewed, no changes at this time  Nitrates PRN for chest pain   4. Hypertension associated with diabetes ([place]) Continue antihypertensive medications as already ordered, these medications have been reviewed and there are no changes at this time.   5. Type 2 diabetes mellitus with proteinuria (HCC) Continue hypoglycemic medications as already ordered, these medications have been reviewed and there are no changes at this time.  Hgb A1C to be monitored as already arranged by primary service     Hortencia Pilar, MD  03/10/2021 2:58 PM

## 2021-03-12 DIAGNOSIS — M1990 Unspecified osteoarthritis, unspecified site: Secondary | ICD-10-CM | POA: Diagnosis not present

## 2021-03-12 DIAGNOSIS — G4733 Obstructive sleep apnea (adult) (pediatric): Secondary | ICD-10-CM | POA: Diagnosis not present

## 2021-03-12 DIAGNOSIS — Z7902 Long term (current) use of antithrombotics/antiplatelets: Secondary | ICD-10-CM | POA: Diagnosis not present

## 2021-03-12 DIAGNOSIS — Z9181 History of falling: Secondary | ICD-10-CM | POA: Diagnosis not present

## 2021-03-12 DIAGNOSIS — Z7982 Long term (current) use of aspirin: Secondary | ICD-10-CM | POA: Diagnosis not present

## 2021-03-12 DIAGNOSIS — Z7984 Long term (current) use of oral hypoglycemic drugs: Secondary | ICD-10-CM | POA: Diagnosis not present

## 2021-03-12 DIAGNOSIS — J449 Chronic obstructive pulmonary disease, unspecified: Secondary | ICD-10-CM | POA: Diagnosis not present

## 2021-03-12 DIAGNOSIS — E1122 Type 2 diabetes mellitus with diabetic chronic kidney disease: Secondary | ICD-10-CM | POA: Diagnosis not present

## 2021-03-12 DIAGNOSIS — Z87891 Personal history of nicotine dependence: Secondary | ICD-10-CM | POA: Diagnosis not present

## 2021-03-12 DIAGNOSIS — I130 Hypertensive heart and chronic kidney disease with heart failure and stage 1 through stage 4 chronic kidney disease, or unspecified chronic kidney disease: Secondary | ICD-10-CM | POA: Diagnosis not present

## 2021-03-12 DIAGNOSIS — E785 Hyperlipidemia, unspecified: Secondary | ICD-10-CM | POA: Diagnosis not present

## 2021-03-12 DIAGNOSIS — Z85038 Personal history of other malignant neoplasm of large intestine: Secondary | ICD-10-CM | POA: Diagnosis not present

## 2021-03-12 DIAGNOSIS — E669 Obesity, unspecified: Secondary | ICD-10-CM | POA: Diagnosis not present

## 2021-03-12 DIAGNOSIS — I509 Heart failure, unspecified: Secondary | ICD-10-CM | POA: Diagnosis not present

## 2021-03-12 DIAGNOSIS — Z96651 Presence of right artificial knee joint: Secondary | ICD-10-CM | POA: Diagnosis not present

## 2021-03-12 DIAGNOSIS — Z8673 Personal history of transient ischemic attack (TIA), and cerebral infarction without residual deficits: Secondary | ICD-10-CM | POA: Diagnosis not present

## 2021-03-12 DIAGNOSIS — N189 Chronic kidney disease, unspecified: Secondary | ICD-10-CM | POA: Diagnosis not present

## 2021-03-17 ENCOUNTER — Encounter (INDEPENDENT_AMBULATORY_CARE_PROVIDER_SITE_OTHER): Payer: Self-pay | Admitting: Vascular Surgery

## 2021-03-17 DIAGNOSIS — I890 Lymphedema, not elsewhere classified: Secondary | ICD-10-CM | POA: Insufficient documentation

## 2021-04-17 ENCOUNTER — Telehealth: Payer: Self-pay | Admitting: Internal Medicine

## 2021-04-17 NOTE — Telephone Encounter (Signed)
OK for verbal orders?

## 2021-04-17 NOTE — Telephone Encounter (Signed)
Copied from [place] [id number]. Topic: Quick Communication - Home Health Verbal Orders >> Apr 17, 2021  1:18 PM Leward Quan A wrote: Caller/Agency: Gevena Cotton // [place] Number: [phone] ok to LM  Requesting OT/PT/Skilled Nursing/Social Work/Speech Therapy: Nursing  Frequency: Medication education and possible prefiling of meds

## 2021-04-17 NOTE — Telephone Encounter (Signed)
Left a message for Justin Griffith providing verbal OK orders per Dr.Johnson. Erik Obey to give our office a call back if she has any questions or concerns.

## 2021-04-18 ENCOUNTER — Ambulatory Visit: Payer: PRIVATE HEALTH INSURANCE | Admitting: Internal Medicine

## 2021-04-22 ENCOUNTER — Encounter: Payer: Self-pay | Admitting: Internal Medicine

## 2021-04-22 ENCOUNTER — Other Ambulatory Visit: Payer: Self-pay

## 2021-04-22 ENCOUNTER — Ambulatory Visit (INDEPENDENT_AMBULATORY_CARE_PROVIDER_SITE_OTHER): Payer: HMO | Admitting: Internal Medicine

## 2021-04-22 VITALS — Ht 70.87 in | Wt 268.6 lb

## 2021-04-22 DIAGNOSIS — I2510 Atherosclerotic heart disease of native coronary artery without angina pectoris: Secondary | ICD-10-CM | POA: Diagnosis not present

## 2021-04-22 DIAGNOSIS — E1159 Type 2 diabetes mellitus with other circulatory complications: Secondary | ICD-10-CM

## 2021-04-22 DIAGNOSIS — E1169 Type 2 diabetes mellitus with other specified complication: Secondary | ICD-10-CM | POA: Diagnosis not present

## 2021-04-22 DIAGNOSIS — G8929 Other chronic pain: Secondary | ICD-10-CM

## 2021-04-22 DIAGNOSIS — E785 Hyperlipidemia, unspecified: Secondary | ICD-10-CM

## 2021-04-22 DIAGNOSIS — I152 Hypertension secondary to endocrine disorders: Secondary | ICD-10-CM

## 2021-04-22 DIAGNOSIS — E1129 Type 2 diabetes mellitus with other diabetic kidney complication: Secondary | ICD-10-CM

## 2021-04-22 DIAGNOSIS — R809 Proteinuria, unspecified: Secondary | ICD-10-CM

## 2021-04-22 DIAGNOSIS — M5441 Lumbago with sciatica, right side: Secondary | ICD-10-CM

## 2021-04-22 LAB — BAYER DCA HB A1C WAIVED: HB A1C (BAYER DCA - WAIVED): 7.5 % — ABNORMAL HIGH (ref 4.8–5.6)

## 2021-04-22 NOTE — Patient Instructions (Signed)
Arthritis Arthritis is a term that is commonly used to refer to joint pain or jointdisease. There are more than 100 types of arthritis. What are the causes? The most common cause of this condition is wear and tear of a joint. Other causes include: Gout. Inflammation of a joint. An infection of a joint. Sprains and other injuries near the joint. A reaction to medicines or drugs, or an allergic reaction. In some cases, the cause may not be known. What are the signs or symptoms? The main symptom of this condition is pain in the joint during movement. Other symptoms include: Redness, swelling, or stiffness at a joint. Warmth coming from the joint. Fever. Overall feeling of illness. How is this diagnosed? This condition may be diagnosed with a physical exam and tests, including: Blood tests. Urine tests. Imaging tests, such as X-rays, an MRI, or a CT scan. Sometimes, fluid is removed from a joint for testing. How is this treated? This condition may be treated with: Treatment of the cause, if it is known. Rest. Raising (elevating) the joint. Applying cold or hot packs to the joint. Medicines to improve symptoms and reduce inflammation. Injections of a steroid such as cortisone into the joint to help reduce pain and inflammation. Depending on the cause of your arthritis, you may need to make lifestyle changes to reduce stress on your joint. Changes may include: Exercising more. Losing weight. Follow these instructions at home: Medicines Take over-the-counter and prescription medicines only as told by your health care provider. Do not take aspirin to relieve pain if your health care provider thinks that gout may be causing your pain. Activity Rest your joint if told by your health care provider. Rest is important when your disease is active and your joint feels painful, swollen, or stiff. Avoid activities that make the pain worse. It is important to balance activity with  rest. Exercise your joint regularly with range-of-motion exercises as told by your health care provider. Try doing low-impact exercise, such as: Swimming. Water aerobics. Biking. Walking. Managing pain, stiffness, and swelling     If directed, put ice on the joint. Put ice in a plastic bag. Place a towel between your skin and the bag. Leave the ice on for 20 minutes, 2-3 times per day. If your joint is swollen, raise (elevate) it above the level of your heart if directed by your health care provider. If your joint feels stiff in the morning, try taking a warm shower. If directed, apply heat to the affected area as often as told by your health care provider. Use the heat source that your health care provider recommends, such as a moist heat pack or a heating pad. If you have diabetes, do not apply heat without permission from your health care provider. To apply heat: Place a towel between your skin and the heat source. Leave the heat on for 20-30 minutes. Remove the heat if your skin turns bright red. This is especially important if you are unable to feel pain, heat, or cold. You may have a greater risk of getting burned. General instructions Do not use any products that contain nicotine or tobacco, such as cigarettes, e-cigarettes, and chewing tobacco. If you need help quitting, ask your health care provider. Keep all follow-up visits as told by your health care provider. This is important. Contact a health care provider if: The pain gets worse. You have a fever. Get help right away if: You develop severe joint pain, swelling, or redness. Many joints   become painful and swollen. You develop severe back pain. You develop severe weakness in your leg. You cannot control your bladder or bowels. Summary Arthritis is a term that is commonly used to refer to joint pain or joint disease. There are more than 100 types of arthritis. The most common cause of this condition is wear and tear of a  joint. Other causes include gout, inflammation or infection of the joint, sprains, or allergies. Symptoms of this condition include redness, swelling, or stiffness of the joint. Other symptoms include warmth, fever, or feeling ill. This condition is treated with rest, elevation, medicines, and applying cold or hot packs. Follow your health care provider's instructions about medicines, activity, exercises, and other home care treatments. This information is not intended to replace advice given to you by your health care provider. Make sure you discuss any questions you have with your healthcare provider. Document Revised: 02/14/2018 Document Reviewed: 02/14/2018 Elsevier Patient Education  2022 [employer]  

## 2021-04-22 NOTE — Progress Notes (Signed)
Ht 5' 10.87" (1.8 m)    Wt 268 lb 9.6 oz (121.8 kg)    BMI 37.60 kg/m    Subjective:    Patient ID: Justin Griffith, male    DOB: [birth year], 81 y.o.   MRN: [id number]  Chief Complaint  Patient presents with   Hypertension   Hyperlipidemia   Diabetes   Fall    Wound on the medial ankle   Edema    B/L swelling    HPI: Justin Griffith is a 81 y.o. male  [place] x 3 weeks ago and fell coming in throigh the front door. Was rx keflex for such by cards he was givn toresimde he was seen by [name].  Hypertension This is a chronic problem. Pertinent negatives include no chest pain.  Diabetes He presents for his follow-up (a1c better at 7.5 last FSBS - 160) diabetic visit. He has type 2 diabetes mellitus. Pertinent negatives for diabetes include no chest pain, no weakness and no weight loss.  Fall The accident occurred More than 1 week ago. Associated symptoms include numbness. Pertinent negatives include no abdominal pain, bowel incontinence or tingling.  Back Pain This is a chronic (has OA in back pain) problem. Associated symptoms include numbness and paresis. Pertinent negatives include no abdominal pain, bladder incontinence, bowel incontinence, chest pain, paresthesias, pelvic pain, perianal numbness, tingling, weakness or weight loss. The treatment provided moderate relief.  Wound Check   Chief Complaint  Patient presents with   Hypertension   Hyperlipidemia   Diabetes   Fall    Wound on the medial ankle   Edema    B/L swelling    Relevant past medical, surgical, family and social history reviewed and updated as indicated. Interim medical history since our last visit reviewed. Allergies and medications reviewed and updated.  Review of Systems  Constitutional:  Negative for weight loss.  Cardiovascular:  Negative for chest pain.  Gastrointestinal:  Negative for abdominal pain and bowel incontinence.  Genitourinary:  Negative for bladder incontinence and pelvic pain.   Musculoskeletal:  Positive for back pain.  Neurological:  Positive for numbness. Negative for tingling, weakness and paresthesias.   Per HPI unless specifically indicated above     Objective:    Ht 5' 10.87" (1.8 m)    Wt 268 lb 9.6 oz (121.8 kg)    BMI 37.60 kg/m   Wt Readings from Last 3 Encounters:  04/22/21 268 lb 9.6 oz (121.8 kg)  03/10/21 271 lb (122.9 kg)  01/16/21 266 lb (120.7 kg)    Physical Exam  Results for orders placed or performed in visit on 04/22/21  Bayer DCA Hb A1c Waived  Result Value Ref Range   HB A1C (BAYER DCA - WAIVED) 7.5 (H) 4.8 - 5.6 %  Lipid panel  Result Value Ref Range   Cholesterol, Total 182 100 - 199 mg/dL   Triglycerides 431 (H) 0 - 149 mg/dL   HDL 29 (L) >39 mg/dL   VLDL Cholesterol Cal 70 (H) 5 - 40 mg/dL   LDL Chol Calc (NIH) 83 0 - 99 mg/dL   Chol/HDL Ratio 6.3 (H) 0.0 - 5.0 ratio  Thyroid Panel With TSH  Result Value Ref Range   TSH 2.740 0.450 - 4.500 uIU/mL   T4, Total 6.4 4.5 - 12.0 ug/dL   T3 Uptake Ratio 28 24 - 39 %   Free Thyroxine Index 1.8 1.2 - 4.9  CBC with Differential/Platelet  Result Value Ref Range   WBC  8.0 3.4 - 10.8 x10E3/uL   RBC 4.40 4.14 - 5.80 x10E6/uL   Hemoglobin 13.1 13.0 - 17.7 g/dL   Hematocrit 40.0 37.5 - 51.0 %   MCV 91 79 - 97 fL   MCH 29.8 26.6 - 33.0 pg   MCHC 32.8 31.5 - 35.7 g/dL   RDW 13.5 11.6 - 15.4 %   Platelets 247 150 - 450 x10E3/uL   Neutrophils 61 Not Estab. %   Lymphs 24 Not Estab. %   Monocytes 10 Not Estab. %   Eos 3 Not Estab. %   Basos 1 Not Estab. %   Neutrophils Absolute 5.0 1.4 - 7.0 x10E3/uL   Lymphocytes Absolute 1.9 0.7 - 3.1 x10E3/uL   Monocytes Absolute 0.8 0.1 - 0.9 x10E3/uL   EOS (ABSOLUTE) 0.2 0.0 - 0.4 x10E3/uL   Basophils Absolute 0.1 0.0 - 0.2 x10E3/uL   Immature Granulocytes 1 Not Estab. %   Immature Grans (Abs) 0.0 0.0 - 0.1 x10E3/uL  Comprehensive metabolic panel  Result Value Ref Range   Glucose 153 (H) 70 - 99 mg/dL   BUN 28 (H) 8 - 27 mg/dL    Creatinine, Ser 1.37 (H) 0.76 - 1.27 mg/dL   eGFR 52 (L) >59 mL/min/1.73   BUN/Creatinine Ratio 20 10 - 24   Sodium 140 134 - 144 mmol/L   Potassium 3.9 3.5 - 5.2 mmol/L   Chloride 98 96 - 106 mmol/L   CO2 27 20 - 29 mmol/L   Calcium 10.2 8.6 - 10.2 mg/dL   Total Protein 6.7 6.0 - 8.5 g/dL   Albumin 4.1 3.6 - 4.6 g/dL   Globulin, Total 2.6 1.5 - 4.5 g/dL   Albumin/Globulin Ratio 1.6 1.2 - 2.2   Bilirubin Total 0.4 0.0 - 1.2 mg/dL   Alkaline Phosphatase 73 44 - 121 IU/L   AST 14 0 - 40 IU/L   ALT 15 0 - 44 IU/L  PSA Total+%Free (Serial)  Result Value Ref Range   Prostate Specific Ag, Serum 4.0 0.0 - 4.0 ng/mL   PSA, Free 1.49 N/A ng/mL   PSA, Free Pct 37.3 %   Serial Monitoring WILL FOLLOW         Current Outpatient Medications:    cloNIDine (CATAPRES) 0.1 MG tablet, Take by mouth., Disp: , Rfl:    Torsemide 40 MG TABS, Take by mouth., Disp: , Rfl:    acyclovir (ZOVIRAX) 400 MG tablet, Take 400 mg by mouth 2 (two) times daily., Disp: , Rfl:    aspirin EC 81 MG EC tablet, Take 1 tablet (81 mg total) by mouth daily. Swallow whole., Disp: 30 tablet, Rfl: 0   carvedilol (COREG) 25 MG tablet, TAKE ONE TABLET BY MOUTH TWO TIMES A DAY FOR BLOOD PRESSURE, Disp: , Rfl:    cloNIDine (CATAPRES) 0.1 MG tablet, Take by mouth., Disp: , Rfl:    cloNIDine (CATAPRES) 0.2 MG tablet, Take 1 tablet by mouth 2 (two) times daily., Disp: , Rfl:    clopidogrel (PLAVIX) 75 MG tablet, Take by mouth., Disp: , Rfl:    clotrimazole-betamethasone (LOTRISONE) cream, Apply 1 application topically 2 (two) times daily., Disp: 30 g, Rfl: 0   fluticasone-salmeterol (ADVAIR) 100-50 MCG/ACT AEPB, Inhale 1 puff into the lungs 2 (two) times daily., Disp: 1 each, Rfl: 3   Fluticasone-Salmeterol (ADVAIR) 100-50 MCG/DOSE AEPB, Inhale 1 puff into the lungs 2 (two) times daily., Disp: 1 each, Rfl: 3   furosemide (LASIX) 40 MG tablet, TAKE ONE TABLET BY MOUTH EVERY DAY FOR BLOOD  PRESSURE AND FOR FLUID CONTROL, Disp: , Rfl:     gabapentin (NEURONTIN) 300 MG capsule, Take 1 capsule by mouth in the morning and at bedtime., Disp: , Rfl:    gabapentin (NEURONTIN) 600 MG tablet, Take by mouth., Disp: , Rfl:    hydrALAZINE (APRESOLINE) 50 MG tablet, TAKE ONE TABLET BY MOUTH THREE TIMES A DAY FOR BLOOD PRESSURE, Disp: , Rfl:    losartan (COZAAR) 100 MG tablet, Take 100 mg by mouth daily., Disp: , Rfl:    metFORMIN (GLUCOPHAGE) 1000 MG tablet, Take 1 tablet (1,000 mg total) by mouth 2 (two) times daily with a meal., Disp: 180 tablet, Rfl: 3   rosuvastatin (CRESTOR) 40 MG tablet, Take 1 tablet (40 mg total) by mouth at bedtime., Disp: 90 tablet, Rfl: 3   tamsulosin (FLOMAX) 0.4 MG CAPS capsule, Take 1 capsule (0.4 mg total) by mouth daily. Patient restarted on his own due to trouble urinating, Disp: 30 capsule, Rfl: 5   Tiotropium Bromide Monohydrate (SPIRIVA RESPIMAT) 2.5 MCG/ACT AERS, Inhale 2 puffs into the lungs daily., Disp: 1 g, Rfl: 3   vitamin B-12 (CYANOCOBALAMIN) 500 MCG tablet, Take 2,000 mcg by mouth at bedtime., Disp: , Rfl:    Vitamin D, Cholecalciferol, 25 MCG (1000 UT) CAPS, Take 2,000 mcg by mouth at bedtime., Disp: , Rfl:     Assessment & Plan:  Fall : recently stable.  To use walker Consider PT  Has chornic physical deconditioning.  The patient has a history of falls. I did complete a risk assessment for falls. A plan of care for falls was documented.   Dm : stable. A1c at 7.5  check HbA1c,  urine  microalbumin  diabetic diet plan given to pt  adviced regarding hypoglycemia and instructions given to pt today on how to prevent and treat the same if it were to occur. pt acknowledges the plan and voices understanding of the same.  exercise plan given and encouraged.   advice diabetic yearly podiatry, ophthalmology , nutritionist , dental check q 6 months  HLD  Not fasting I son crestor for such  recheck FLP, check LFT's work on diet, SE of meds explained to pt. low fat and high fiber diet explained to  pt.   Problem List Items Addressed This Visit       Cardiovascular and Mediastinum   Hypertension associated with diabetes ([place]) - Primary   Relevant Medications   cloNIDine (CATAPRES) 0.1 MG tablet   Torsemide 40 MG TABS   Other Relevant Orders   PSA Total+%Free (Serial) (Completed)   Comprehensive metabolic panel (Completed)   CBC with Differential/Platelet (Completed)   Thyroid Panel With TSH (Completed)   Bayer DCA Hb A1c Waived (Completed)   Lipid panel (Completed)   Coronary artery disease   Relevant Medications   cloNIDine (CATAPRES) 0.1 MG tablet   Torsemide 40 MG TABS   Other Relevant Orders   PSA Total+%Free (Serial) (Completed)   Comprehensive metabolic panel (Completed)   CBC with Differential/Platelet (Completed)   Thyroid Panel With TSH (Completed)   Bayer DCA Hb A1c Waived (Completed)   Lipid panel (Completed)     Endocrine   Type 2 diabetes mellitus with proteinuria (HCC)   Relevant Orders   PSA Total+%Free (Serial) (Completed)   Comprehensive metabolic panel (Completed)   CBC with Differential/Platelet (Completed)   Thyroid Panel With TSH (Completed)   Bayer DCA Hb A1c Waived (Completed)   Lipid panel (Completed)   Hyperlipidemia associated with type 2 diabetes mellitus (  HCC)   Relevant Medications   cloNIDine (CATAPRES) 0.1 MG tablet   Torsemide 40 MG TABS   Other Relevant Orders   PSA Total+%Free (Serial) (Completed)   Comprehensive metabolic panel (Completed)   CBC with Differential/Platelet (Completed)   Thyroid Panel With TSH (Completed)   Bayer DCA Hb A1c Waived (Completed)   Lipid panel (Completed)     Other   Back pain   Relevant Orders   Ambulatory referral to Orthopedics     Orders Placed This Encounter  Procedures   PSA Total+%Free (Serial)   Comprehensive metabolic panel   CBC with Differential/Platelet   Thyroid Panel With TSH   Bayer DCA Hb A1c Waived   Lipid panel   Ambulatory referral to Orthopedics     No orders of  the defined types were placed in this encounter.    Follow up plan: Return in about 3 months (around 07/20/2021).  Health Maintenance :  Mammogram/ Paps smear: DEXA: PSA :  Cscope : Pneumonia vaccine : prenvanar / pneumovax  FLu vaccine :  Labs next visit : CBC, CMP, FLP, HBA1C, TSH, PSA, urine microalbumin Labs 1 week prior to next visit.

## 2021-04-23 LAB — THYROID PANEL WITH TSH
Free Thyroxine Index: 1.8 (ref 1.2–4.9)
T3 Uptake Ratio: 28 % (ref 24–39)
T4, Total: 6.4 ug/dL (ref 4.5–12.0)
TSH: 2.74 u[IU]/mL (ref 0.450–4.500)

## 2021-04-23 LAB — CBC WITH DIFFERENTIAL/PLATELET
Basophils Absolute: 0.1 10*3/uL (ref 0.0–0.2)
Basos: 1 %
EOS (ABSOLUTE): 0.2 10*3/uL (ref 0.0–0.4)
Eos: 3 %
Hematocrit: 40 % (ref 37.5–51.0)
Hemoglobin: 13.1 g/dL (ref 13.0–17.7)
Immature Grans (Abs): 0 10*3/uL (ref 0.0–0.1)
Immature Granulocytes: 1 %
Lymphocytes Absolute: 1.9 10*3/uL (ref 0.7–3.1)
Lymphs: 24 %
MCH: 29.8 pg (ref 26.6–33.0)
MCHC: 32.8 g/dL (ref 31.5–35.7)
MCV: 91 fL (ref 79–97)
Monocytes Absolute: 0.8 10*3/uL (ref 0.1–0.9)
Monocytes: 10 %
Neutrophils Absolute: 5 10*3/uL (ref 1.4–7.0)
Neutrophils: 61 %
Platelets: 247 10*3/uL (ref 150–450)
RBC: 4.4 x10E6/uL (ref 4.14–5.80)
RDW: 13.5 % (ref 11.6–15.4)
WBC: 8 10*3/uL (ref 3.4–10.8)

## 2021-04-23 LAB — COMPREHENSIVE METABOLIC PANEL
ALT: 15 IU/L (ref 0–44)
AST: 14 IU/L (ref 0–40)
Albumin/Globulin Ratio: 1.6 (ref 1.2–2.2)
Albumin: 4.1 g/dL (ref 3.6–4.6)
Alkaline Phosphatase: 73 IU/L (ref 44–121)
BUN/Creatinine Ratio: 20 (ref 10–24)
BUN: 28 mg/dL — ABNORMAL HIGH (ref 8–27)
Bilirubin Total: 0.4 mg/dL (ref 0.0–1.2)
CO2: 27 mmol/L (ref 20–29)
Calcium: 10.2 mg/dL (ref 8.6–10.2)
Chloride: 98 mmol/L (ref 96–106)
Creatinine, Ser: 1.37 mg/dL — ABNORMAL HIGH (ref 0.76–1.27)
Globulin, Total: 2.6 g/dL (ref 1.5–4.5)
Glucose: 153 mg/dL — ABNORMAL HIGH (ref 70–99)
Potassium: 3.9 mmol/L (ref 3.5–5.2)
Sodium: 140 mmol/L (ref 134–144)
Total Protein: 6.7 g/dL (ref 6.0–8.5)
eGFR: 52 mL/min/{1.73_m2} — ABNORMAL LOW (ref >59)

## 2021-04-23 LAB — LIPID PANEL
Chol/HDL Ratio: 6.3 ratio — ABNORMAL HIGH (ref 0.0–5.0)
Cholesterol, Total: 182 mg/dL (ref 100–199)
HDL: 29 mg/dL — ABNORMAL LOW (ref >39)
LDL Chol Calc (NIH): 83 mg/dL (ref 0–99)
Triglycerides: 431 mg/dL — ABNORMAL HIGH (ref 0–149)
VLDL Cholesterol Cal: 70 mg/dL — ABNORMAL HIGH (ref 5–40)

## 2021-04-23 LAB — PSA TOTAL+% FREE (SERIAL)
PSA, Free Pct: 37.3 %
PSA, Free: 1.49 ng/mL
Prostate Specific Ag, Serum: 4 ng/mL (ref 0.0–4.0)

## 2021-05-22 DIAGNOSIS — Z794 Long term (current) use of insulin: Secondary | ICD-10-CM | POA: Diagnosis not present

## 2021-05-22 DIAGNOSIS — B351 Tinea unguium: Secondary | ICD-10-CM | POA: Diagnosis not present

## 2021-05-22 DIAGNOSIS — E1140 Type 2 diabetes mellitus with diabetic neuropathy, unspecified: Secondary | ICD-10-CM | POA: Diagnosis not present

## 2021-05-27 DIAGNOSIS — I152 Hypertension secondary to endocrine disorders: Secondary | ICD-10-CM | POA: Diagnosis not present

## 2021-05-27 DIAGNOSIS — E1142 Type 2 diabetes mellitus with diabetic polyneuropathy: Secondary | ICD-10-CM | POA: Diagnosis not present

## 2021-05-27 DIAGNOSIS — E785 Hyperlipidemia, unspecified: Secondary | ICD-10-CM | POA: Diagnosis not present

## 2021-05-27 DIAGNOSIS — E1159 Type 2 diabetes mellitus with other circulatory complications: Secondary | ICD-10-CM | POA: Diagnosis not present

## 2021-05-27 DIAGNOSIS — Z794 Long term (current) use of insulin: Secondary | ICD-10-CM | POA: Diagnosis not present

## 2021-05-27 DIAGNOSIS — E1169 Type 2 diabetes mellitus with other specified complication: Secondary | ICD-10-CM | POA: Diagnosis not present

## 2021-06-04 DIAGNOSIS — R0609 Other forms of dyspnea: Secondary | ICD-10-CM | POA: Diagnosis not present

## 2021-06-04 DIAGNOSIS — G4733 Obstructive sleep apnea (adult) (pediatric): Secondary | ICD-10-CM | POA: Diagnosis not present

## 2021-06-09 ENCOUNTER — Ambulatory Visit (INDEPENDENT_AMBULATORY_CARE_PROVIDER_SITE_OTHER): Payer: HMO | Admitting: Vascular Surgery

## 2021-06-09 ENCOUNTER — Encounter (INDEPENDENT_AMBULATORY_CARE_PROVIDER_SITE_OTHER): Payer: Self-pay

## 2021-06-25 ENCOUNTER — Other Ambulatory Visit: Payer: Self-pay

## 2021-06-25 ENCOUNTER — Inpatient Hospital Stay
Admission: EM | Admit: 2021-06-25 | Discharge: 2021-06-26 | DRG: 690 | Disposition: A | Discharge status: Home Health | Payer: HMO | Attending: Internal Medicine | Admitting: Internal Medicine

## 2021-06-25 ENCOUNTER — Encounter: Payer: Self-pay | Admitting: Emergency Medicine

## 2021-06-25 ENCOUNTER — Emergency Department: Payer: HMO

## 2021-06-25 DIAGNOSIS — W19XXXA Unspecified fall, initial encounter: Secondary | ICD-10-CM | POA: Diagnosis present

## 2021-06-25 DIAGNOSIS — Z87891 Personal history of nicotine dependence: Secondary | ICD-10-CM

## 2021-06-25 DIAGNOSIS — E1142 Type 2 diabetes mellitus with diabetic polyneuropathy: Secondary | ICD-10-CM | POA: Diagnosis present

## 2021-06-25 DIAGNOSIS — E1165 Type 2 diabetes mellitus with hyperglycemia: Secondary | ICD-10-CM | POA: Diagnosis present

## 2021-06-25 DIAGNOSIS — Z5919 Other inadequate housing: Secondary | ICD-10-CM | POA: Diagnosis not present

## 2021-06-25 DIAGNOSIS — I110 Hypertensive heart disease with heart failure: Secondary | ICD-10-CM | POA: Diagnosis not present

## 2021-06-25 DIAGNOSIS — I2510 Atherosclerotic heart disease of native coronary artery without angina pectoris: Secondary | ICD-10-CM | POA: Diagnosis present

## 2021-06-25 DIAGNOSIS — E1151 Type 2 diabetes mellitus with diabetic peripheral angiopathy without gangrene: Secondary | ICD-10-CM | POA: Diagnosis present

## 2021-06-25 DIAGNOSIS — Z8673 Personal history of transient ischemic attack (TIA), and cerebral infarction without residual deficits: Secondary | ICD-10-CM

## 2021-06-25 DIAGNOSIS — H9190 Unspecified hearing loss, unspecified ear: Secondary | ICD-10-CM | POA: Diagnosis not present

## 2021-06-25 DIAGNOSIS — N400 Enlarged prostate without lower urinary tract symptoms: Secondary | ICD-10-CM | POA: Diagnosis present

## 2021-06-25 DIAGNOSIS — Z8719 Personal history of other diseases of the digestive system: Secondary | ICD-10-CM

## 2021-06-25 DIAGNOSIS — Z85038 Personal history of other malignant neoplasm of large intestine: Secondary | ICD-10-CM | POA: Diagnosis not present

## 2021-06-25 DIAGNOSIS — Z79899 Other long term (current) drug therapy: Secondary | ICD-10-CM

## 2021-06-25 DIAGNOSIS — I5032 Chronic diastolic (congestive) heart failure: Secondary | ICD-10-CM | POA: Diagnosis present

## 2021-06-25 DIAGNOSIS — J449 Chronic obstructive pulmonary disease, unspecified: Secondary | ICD-10-CM | POA: Diagnosis present

## 2021-06-25 DIAGNOSIS — E860 Dehydration: Secondary | ICD-10-CM | POA: Diagnosis present

## 2021-06-25 DIAGNOSIS — Z800 Family history of malignant neoplasm of digestive organs: Secondary | ICD-10-CM | POA: Diagnosis not present

## 2021-06-25 DIAGNOSIS — Z833 Family history of diabetes mellitus: Secondary | ICD-10-CM

## 2021-06-25 DIAGNOSIS — G4733 Obstructive sleep apnea (adult) (pediatric): Secondary | ICD-10-CM | POA: Diagnosis not present

## 2021-06-25 DIAGNOSIS — Z820 Family history of epilepsy and other diseases of the nervous system: Secondary | ICD-10-CM | POA: Diagnosis not present

## 2021-06-25 DIAGNOSIS — R531 Weakness: Secondary | ICD-10-CM | POA: Diagnosis not present

## 2021-06-25 DIAGNOSIS — J439 Emphysema, unspecified: Secondary | ICD-10-CM | POA: Diagnosis not present

## 2021-06-25 DIAGNOSIS — R0689 Other abnormalities of breathing: Secondary | ICD-10-CM | POA: Diagnosis not present

## 2021-06-25 DIAGNOSIS — Z043 Encounter for examination and observation following other accident: Secondary | ICD-10-CM | POA: Diagnosis not present

## 2021-06-25 DIAGNOSIS — I700 Atherosclerosis of aorta: Secondary | ICD-10-CM | POA: Diagnosis not present

## 2021-06-25 DIAGNOSIS — Z888 Allergy status to other drugs, medicaments and biological substances status: Secondary | ICD-10-CM

## 2021-06-25 DIAGNOSIS — N390 Urinary tract infection, site not specified: Principal | ICD-10-CM | POA: Diagnosis present

## 2021-06-25 DIAGNOSIS — R000 Tachycardia, unspecified: Secondary | ICD-10-CM | POA: Diagnosis present

## 2021-06-25 DIAGNOSIS — M1990 Unspecified osteoarthritis, unspecified site: Secondary | ICD-10-CM | POA: Diagnosis present

## 2021-06-25 DIAGNOSIS — Z6838 Body mass index (BMI) 38.0-38.9, adult: Secondary | ICD-10-CM | POA: Diagnosis not present

## 2021-06-25 DIAGNOSIS — R509 Fever, unspecified: Secondary | ICD-10-CM | POA: Diagnosis not present

## 2021-06-25 DIAGNOSIS — E785 Hyperlipidemia, unspecified: Secondary | ICD-10-CM | POA: Diagnosis not present

## 2021-06-25 DIAGNOSIS — Z7984 Long term (current) use of oral hypoglycemic drugs: Secondary | ICD-10-CM

## 2021-06-25 DIAGNOSIS — E876 Hypokalemia: Secondary | ICD-10-CM | POA: Diagnosis present

## 2021-06-25 DIAGNOSIS — R0682 Tachypnea, not elsewhere classified: Secondary | ICD-10-CM | POA: Diagnosis present

## 2021-06-25 DIAGNOSIS — R296 Repeated falls: Secondary | ICD-10-CM | POA: Diagnosis present

## 2021-06-25 DIAGNOSIS — G250 Essential tremor: Secondary | ICD-10-CM | POA: Diagnosis not present

## 2021-06-25 DIAGNOSIS — Z7982 Long term (current) use of aspirin: Secondary | ICD-10-CM

## 2021-06-25 LAB — URINALYSIS, ROUTINE W REFLEX MICROSCOPIC
Bilirubin Urine: NEGATIVE
Glucose, UA: NEGATIVE mg/dL
Ketones, ur: NEGATIVE mg/dL
Nitrite: NEGATIVE
Protein, ur: >=300 mg/dL — AB
Specific Gravity, Urine: 1.009 (ref 1.005–1.030)
WBC, UA: >50 WBC/hpf — ABNORMAL HIGH (ref 0–5)
pH: 5 (ref 5.0–8.0)

## 2021-06-25 LAB — CBC WITH DIFFERENTIAL/PLATELET
Abs Immature Granulocytes: 0.08 10*3/uL — ABNORMAL HIGH (ref 0.00–0.07)
Basophils Absolute: 0.1 10*3/uL (ref 0.0–0.1)
Basophils Relative: 0 %
Eosinophils Absolute: 0 10*3/uL (ref 0.0–0.5)
Eosinophils Relative: 0 %
HCT: 38.1 % — ABNORMAL LOW (ref 39.0–52.0)
Hemoglobin: 12.8 g/dL — ABNORMAL LOW (ref 13.0–17.0)
Immature Granulocytes: 1 %
Lymphocytes Relative: 11 %
Lymphs Abs: 1.4 10*3/uL (ref 0.7–4.0)
MCH: 29.5 pg (ref 26.0–34.0)
MCHC: 33.6 g/dL (ref 30.0–36.0)
MCV: 87.8 fL (ref 80.0–100.0)
Monocytes Absolute: 1.1 10*3/uL — ABNORMAL HIGH (ref 0.1–1.0)
Monocytes Relative: 8 %
Neutro Abs: 10.6 10*3/uL — ABNORMAL HIGH (ref 1.7–7.7)
Neutrophils Relative %: 80 %
Platelets: 212 10*3/uL (ref 150–400)
RBC: 4.34 MIL/uL (ref 4.22–5.81)
RDW: 12.9 % (ref 11.5–15.5)
WBC: 13.2 10*3/uL — ABNORMAL HIGH (ref 4.0–10.5)
nRBC: 0 % (ref 0.0–0.2)

## 2021-06-25 LAB — COMPREHENSIVE METABOLIC PANEL
ALT: 13 U/L (ref 0–44)
AST: 18 U/L (ref 15–41)
Albumin: 3.4 g/dL — ABNORMAL LOW (ref 3.5–5.0)
Alkaline Phosphatase: 51 U/L (ref 38–126)
Anion gap: 11 (ref 5–15)
BUN: 14 mg/dL (ref 8–23)
CO2: 25 mmol/L (ref 22–32)
Calcium: 9.8 mg/dL (ref 8.9–10.3)
Chloride: 99 mmol/L (ref 98–111)
Creatinine, Ser: 1.21 mg/dL (ref 0.61–1.24)
GFR, Estimated: >60 mL/min (ref >60)
Glucose, Bld: 207 mg/dL — ABNORMAL HIGH (ref 70–99)
Potassium: 3.3 mmol/L — ABNORMAL LOW (ref 3.5–5.1)
Sodium: 135 mmol/L (ref 135–145)
Total Bilirubin: 1.1 mg/dL (ref 0.3–1.2)
Total Protein: 7.6 g/dL (ref 6.5–8.1)

## 2021-06-25 LAB — PROTIME-INR
INR: 1.1 (ref 0.8–1.2)
Prothrombin Time: 14.1 seconds (ref 11.4–15.2)

## 2021-06-25 LAB — LACTIC ACID, PLASMA: Lactic Acid, Venous: 1.9 mmol/L (ref 0.5–1.9)

## 2021-06-25 MED ORDER — GABAPENTIN 100 MG PO CAPS
100.0000 mg | ORAL_CAPSULE | Freq: Two times a day (BID) | ORAL | Status: DC
  Administered 2021-06-25 – 2021-06-26 (×2): 100 mg via ORAL
  Filled 2021-06-25 (×2): qty 1

## 2021-06-25 MED ORDER — LACTATED RINGERS IV SOLN: INTRAVENOUS | Status: DC

## 2021-06-25 MED ORDER — ENOXAPARIN SODIUM 40 MG/0.4ML IJ SOSY: 40.0000 mg | PREFILLED_SYRINGE | INTRAMUSCULAR | Status: DC

## 2021-06-25 MED ORDER — MELATONIN 5 MG PO TABS: 2.5000 mg | ORAL_TABLET | Freq: Every evening | ORAL | Status: DC | PRN

## 2021-06-25 MED ORDER — VITAMIN B-12 1000 MCG PO TABS
2000.0000 ug | ORAL_TABLET | Freq: Every day | ORAL | Status: DC
  Administered 2021-06-25: 2000 ug via ORAL
  Filled 2021-06-25 (×2): qty 2

## 2021-06-25 MED ORDER — ENOXAPARIN SODIUM 60 MG/0.6ML IJ SOSY
0.5000 mg/kg | PREFILLED_SYRINGE | INTRAMUSCULAR | Status: DC
  Administered 2021-06-25: 60 mg via SUBCUTANEOUS
  Filled 2021-06-25: qty 0.6

## 2021-06-25 MED ORDER — ACETAMINOPHEN 325 MG PO TABS
650.0000 mg | ORAL_TABLET | Freq: Four times a day (QID) | ORAL | Status: DC | PRN
  Administered 2021-06-26: 650 mg via ORAL
  Filled 2021-06-25: qty 2

## 2021-06-25 MED ORDER — POTASSIUM CHLORIDE CRYS ER 20 MEQ PO TBCR
40.0000 meq | EXTENDED_RELEASE_TABLET | Freq: Two times a day (BID) | ORAL | Status: AC
  Administered 2021-06-25 – 2021-06-26 (×2): 40 meq via ORAL
  Filled 2021-06-25 (×2): qty 2

## 2021-06-25 MED ORDER — POLYETHYLENE GLYCOL 3350 17 G PO PACK: 17.0000 g | PACK | Freq: Every day | ORAL | Status: DC | PRN

## 2021-06-25 MED ORDER — INSULIN ASPART 100 UNIT/ML IJ SOLN
0.0000 [IU] | Freq: Three times a day (TID) | INTRAMUSCULAR | Status: DC
  Administered 2021-06-26 (×2): 3 [IU] via SUBCUTANEOUS
  Filled 2021-06-25 (×2): qty 1

## 2021-06-25 MED ORDER — SODIUM CHLORIDE 0.9 % IV SOLN
1.0000 g | Freq: Once | INTRAVENOUS | Status: DC
  Filled 2021-06-25: qty 10

## 2021-06-25 MED ORDER — SODIUM CHLORIDE 0.9 % IV SOLN
1.0000 g | Freq: Once | INTRAVENOUS | Status: AC
  Administered 2021-06-25: 1 g via INTRAVENOUS
  Filled 2021-06-25: qty 10

## 2021-06-25 MED ORDER — VITAMIN D 25 MCG (1000 UNIT) PO TABS
2000.0000 ug | ORAL_TABLET | Freq: Every day | ORAL | Status: DC
  Administered 2021-06-25: 2000 ug via ORAL
  Filled 2021-06-25: qty 2

## 2021-06-25 MED ORDER — TAMSULOSIN HCL 0.4 MG PO CAPS
0.4000 mg | ORAL_CAPSULE | Freq: Every day | ORAL | Status: DC
  Administered 2021-06-26: 0.4 mg via ORAL
  Filled 2021-06-25: qty 1

## 2021-06-25 MED ORDER — ROSUVASTATIN CALCIUM 20 MG PO TABS
40.0000 mg | ORAL_TABLET | Freq: Every day | ORAL | Status: DC
  Administered 2021-06-25: 40 mg via ORAL
  Filled 2021-06-25 (×2): qty 2

## 2021-06-25 MED ORDER — INSULIN ASPART 100 UNIT/ML IJ SOLN: 0.0000 [IU] | Freq: Every day | INTRAMUSCULAR | Status: DC

## 2021-06-25 MED ORDER — SODIUM CHLORIDE 0.9 % IV SOLN
2.0000 g | INTRAVENOUS | Status: DC
  Filled 2021-06-25: qty 20

## 2021-06-25 MED ORDER — ONDANSETRON HCL 4 MG/2ML IJ SOLN: 4.0000 mg | Freq: Four times a day (QID) | INTRAMUSCULAR | Status: DC | PRN

## 2021-06-25 NOTE — ED Triage Notes (Addendum)
Pt via EMS from home. Pt had a fall this AM, EMS advised pt to got to the bathroom, but patient refused. EMS was called out again for a second fall today. Denies any pain. Denies any head injury. Pt has a very strong odor smell to urine. EMS reports and temp of 102.8, HR of 105, 25 RR, and pt was 91% on RA. Pt placed on 2L [place] on arrival. Pt is HOH and does not have his hearing aids with him.  ?Pt is A&OX4 and NAD.  ?

## 2021-06-25 NOTE — ED Provider Notes (Signed)
? ?[facility] ?Provider Note ? ? ? Event Date/Time  ? First MD Initiated Contact with Patient 06/25/21 1938   ?  (approximate) ? ? ?History  ? ?Fall ? ? ?HPI ? ?Justin Griffith is a 81 y.o. male  who, per pulmonology note dated 06/04/21 has history of HLD, HTN, DM, who presents to the emergency department today because of concern for fall and weakness. Patient states that he had two falls today. He says he feels weak in his legs. Attributes some of this to neuropathy and cramps that he has in his legs. The patient was found to have fever by EMS.  ? ?  ? ? ?Physical Exam  ? ?Triage Vital Signs: ?ED Triage Vitals  ?Enc Vitals Group  ?   BP 06/25/21 1845 (!) 166/70  ?   Pulse Rate 06/25/21 1845 88  ?   Resp 06/25/21 1845 (!) 24  ?   Temp 06/25/21 1845 100 ?F (37.8 ?C)  ?   Temp Source 06/25/21 1845 Oral  ?   SpO2 06/25/21 1845 94 %  ?   Weight 06/25/21 1842 265 lb (120.2 kg)  ?   Height 06/25/21 1842 '5\' 10"'$  (1.778 m)  ?   Head Circumference --   ?   Peak Flow --   ?   Pain Score 06/25/21 1842 0  ?   Pain Loc --   ?   Pain Edu? --   ?   Excl. in [place]? --   ? ? ?Most recent vital signs: ?Vitals:  ? 06/25/21 1845 06/25/21 1900  ?BP: (!) 166/70 (!) 149/70  ?Pulse: 88 92  ?Resp: (!) 24 (!) 28  ?Temp: 100 ?F (37.8 ?C)   ?SpO2: 94% 94%  ? ?General: Awake, no distress.  ?CV:  Good peripheral perfusion.  ?Resp:  Tachycardic. No crackles/wheezing/rhonchi. ?Abd:  No distention. Minimally tender in the suprapubic region. ? ?ED Results / Procedures / Treatments  ? ?Labs ?(all labs ordered are listed, but only abnormal results are displayed) ?Labs Reviewed  ?COMPREHENSIVE METABOLIC PANEL - Abnormal; Notable for the following components:  ?    Result Value  ? Potassium 3.3 (*)   ? Glucose, Bld 207 (*)   ? Albumin 3.4 (*)   ? All other components within normal limits  ?CBC WITH DIFFERENTIAL/PLATELET - Abnormal; Notable for the following components:  ? WBC 13.2 (*)   ? Hemoglobin 12.8 (*)   ? HCT 38.1 (*)   ? Neutro  Abs 10.6 (*)   ? Monocytes Absolute 1.1 (*)   ? Abs Immature Granulocytes 0.08 (*)   ? All other components within normal limits  ?CULTURE, BLOOD (ROUTINE X 2)  ?CULTURE, BLOOD (ROUTINE X 2)  ?LACTIC ACID, PLASMA  ?PROTIME-INR  ?LACTIC ACID, PLASMA  ?URINALYSIS, ROUTINE W REFLEX MICROSCOPIC  ? ? ? ?EKG ? ?INance Pear, attending physician, personally viewed and interpreted this EKG ? ?EKG Time: 1830 ?Rate: 99 ?Rhythm: sinus rhythm with prequent pac ?Axis: left axis deviation ?Intervals: qtc 462 ?QRS: narrow ?ST changes: no st elevation ?Impression: abnormal ekg ? ?RADIOLOGY ?I independently interpreted and visualized the CXR. My interpretation: No pneumonia. No pneumothorax.  ?Radiology interpretation:  ?IMPRESSION:  ?No acute cardiopulmonary process.  ?   ? ? ?PROCEDURES: ? ?Critical Care performed: No ? ?Procedures ? ? ?MEDICATIONS ORDERED IN ED: ?Medications - No data to display ? ? ?IMPRESSION / MDM / ASSESSMENT AND PLAN / ED COURSE  ?I reviewed the triage vital signs  and the nursing notes. ?             ?               ? ?Differential diagnosis includes, but is not limited to, pneumonia, UTI, bacteremia, anemia, electrolyte abnormality. ? ?Patient's blood work here does show a slight leukocytosis.  No lactic acidosis.  Chest x-ray without pneumonia.  UA is consistent with urinary tract infection.  I do think this could cause the patient's weakness and falls.  Would also explain the patient's fever and leukocytosis.  We will plan on starting IV antibiotics.  Discussed with Dr. Nevada Crane with the hospitalist team who will plan on admission.  Discussed findings and plan with patient. ? ? ? ? ?FINAL CLINICAL IMPRESSION(S) / ED DIAGNOSES  ? ?Final diagnoses:  ?Fall, initial encounter  ?Lower urinary tract infectious disease  ? ? ? ? ?Note:  This document was prepared using Dragon voice recognition software and may include unintentional dictation errors. ? ? ?  ?Nance Pear, MD ?06/25/21 2141 ? ?

## 2021-06-25 NOTE — H&P (Addendum)
?History and Physical ? ?Justin Griffith SHF:[id number] DOB: 1940-12-25 DOA: 06/25/2021 ? ?Referring physician: Dr. Archie Balboa  ?PCP: Charlynne Cousins, MD  ?Outpatient Specialists: Pulmonology, endocrinology, neurology, cardiology, podiatry. ?Patient coming from: Home via EMS. ? ?Chief Complaint: Recurrent falls, generalized weakness. ? ?HPI: Justin Griffith is a 81 y.o. male with medical history significant for type 2 diabetes, diabetic polyneuropathy, hyperlipidemia, hypertension, morbid obesity, benign essential tremors, OSA on CPAP, prior CVA, BPH, chronic venous insufficiency, coronary artery disease, COPD, hearing loss, who presented to [facility] ED from home after recurrent falls.  Associated with generalized weakness.  Patient endorses that he fell twice today.  No loss of consciousness.  Did not hit his head.  States he feels weak in his legs.  He attributes his symptoms to polyneuropathy.  Per EMS assessment patient was febrile with Tmax of 102.8, tachycardic 105, tachypneic 25.  He was also noted to have O2 saturation of 91% on room air.  He was placed on 2 L nasal cannula by EMS.  Work-up in the ED revealed presumptive UTI.  TRH, hospitalist team, was asked to admit. ? ?*Per bedside RN, bed bugs, found on the patient.  Contacted precautions in place. ? ?ED Course: Temperature 100.  BP 139/62, heart rate 73, respiratory rate 18, saturation 96% on 2 L.  Lab studies remarkable for serum potassium 3.3, serum glucose 207.  Lactic acid normal 1.9.  WBC 13.2, hemoglobin 12.8. ? ?Review of Systems: ?Review of systems as noted in the HPI. All other systems reviewed and are negative. ? ? ?Past Medical History:  ?Diagnosis Date  ? Arthritis   ? Asthma 2000  ? Back pain   ? Colon polyp 2012  ? COPD (chronic obstructive pulmonary disease) ([place])   ? Emphysema of lung ([place])   ? Heart murmur   ? Hernia 2000  ? Hyperlipidemia   ? Hypertension   ? Personal history of malignant neoplasm of large intestine   ? Personal history of tobacco  use, presenting hazards to health   ? Sleep apnea   ? Special screening for malignant neoplasms, colon   ? Type 2 diabetes mellitus with proteinuria ([place]) 10/11/2014  ? ?Past Surgical History:  ?Procedure Laterality Date  ? CARDIAC CATHETERIZATION Left 09/25/2015  ? Procedure: Left Heart Cath and Coronary Angiography;  Surgeon: Yolonda Kida, MD;  Location: [place] CV LAB;  Service: Cardiovascular;  Laterality: Left;  ? CHOLECYSTECTOMY  1970  ? COLON SURGERY  07-16-99  ? sigmoid colon resection with primary anastomosis, chemotherapy for metastatic disease  ? COLONOSCOPY  2001, 2012  ? Dr Bary Castilla, tubular adenoma of the cecum and ascending colon in 2012.  ? COLONOSCOPY WITH PROPOFOL N/A 02/12/2016  ? Procedure: COLONOSCOPY WITH PROPOFOL;  Surgeon: Robert Bellow, MD;  Location: [facility] ENDOSCOPY;  Service: Endoscopy;  Laterality: N/A;  ? CORONARY STENT INTERVENTION N/A 05/17/2020  ? Procedure: CORONARY STENT INTERVENTION;  Surgeon: Yolonda Kida, MD;  Location: [place] [facility];  Service: Cardiovascular;  Laterality: N/A;  LAD  ? ESOPHAGOGASTRODUODENOSCOPY (EGD) WITH PROPOFOL N/A 02/12/2016  ? Procedure: ESOPHAGOGASTRODUODENOSCOPY (EGD) WITH PROPOFOL;  Surgeon: Robert Bellow, MD;  Location: [facility] ENDOSCOPY;  Service: Endoscopy;  Laterality: N/A;  ? HERNIA REPAIR Right   ? right inguinal hernia repair  ? JOINT REPLACEMENT Bilateral   ? knee replacement  ? KNEE SURGERY Bilateral 2010  ? LEFT HEART CATH AND CORONARY ANGIOGRAPHY N/A 05/17/2020  ? Procedure: LEFT HEART CATH AND CORONARY ANGIOGRAPHY possible PCI and stent;  Surgeon: Yolonda Kida, MD;  Location: [place] [facility];  Service: Cardiovascular;  Laterality: N/A;  ? MYRINGOTOMY WITH TUBE PLACEMENT Bilateral 08/16/2014  ? Procedure: MYRINGOTOMY WITH TUBE PLACEMENT;  Surgeon: Carloyn Manner, MD;  Location: [facility] ORS;  Service: ENT;  Laterality: Bilateral;  ? PERIPHERAL VASCULAR CATHETERIZATION Right 09/04/2015  ? Procedure: Lower Extremity  Angiography;  Surgeon: Katha Cabal, MD;  Location: [place] [facility];  Service: Cardiovascular;  Laterality: Right;  ? PERIPHERAL VASCULAR CATHETERIZATION  09/04/2015  ? Procedure: Lower Extremity Intervention;  Surgeon: Katha Cabal, MD;  Location: [place] [facility];  Service: Cardiovascular;;  ? TONSILLECTOMY    ? TYMPANOSTOMY TUBE PLACEMENT    ? ? ?Social History:  reports that he quit smoking about 32 years ago. His smoking use included cigarettes. He has a 30.00 pack-year smoking history. He has never used smokeless tobacco. He reports that he does not drink alcohol and does not use drugs. ? ? ?Allergies  ?Allergen Reactions  ? Wilder Glade [Dapagliflozin] Rash  ?  Causes a severe rash in groin area ?  ? Dulaglutide Diarrhea and Other (See Comments)  ? Liraglutide Diarrhea and Other (See Comments)  ? Jardiance [Empagliflozin] Rash  ? ? ?Family History  ?Problem Relation Age of Onset  ? Diabetes Father   ? Esophageal cancer Mother   ? Alzheimer's disease Paternal Uncle   ?  ? ? ?Prior to Admission medications   ?Medication Sig Start Date End Date Taking? Authorizing Provider  ?acyclovir (ZOVIRAX) 400 MG tablet Take 400 mg by mouth 2 (two) times daily.    [provider]  ?aspirin EC 81 MG EC tablet Take 1 tablet (81 mg total) by mouth daily. Swallow whole. 05/22/20   Sharen Hones, MD  ?carvedilol (COREG) 25 MG tablet TAKE ONE TABLET BY MOUTH TWO TIMES A DAY FOR BLOOD PRESSURE 12/16/20   [provider]  ?cloNIDine (CATAPRES) 0.1 MG tablet Take by mouth. 08/23/20   [provider]  ?cloNIDine (CATAPRES) 0.1 MG tablet Take by mouth. 04/14/21   [provider]  ?cloNIDine (CATAPRES) 0.2 MG tablet Take 1 tablet by mouth 2 (two) times daily. 12/16/20   [provider]  ?clotrimazole-betamethasone (LOTRISONE) cream Apply 1 application topically 2 (two) times daily. 05/15/20   Vigg, Avanti, MD  ?fluticasone-salmeterol (ADVAIR) 100-50 MCG/ACT AEPB Inhale 1 puff into the  lungs 2 (two) times daily. 01/16/21   Vigg, Avanti, MD  ?Fluticasone-Salmeterol (ADVAIR) 100-50 MCG/DOSE AEPB Inhale 1 puff into the lungs 2 (two) times daily. 06/07/20   Vigg, Avanti, MD  ?furosemide (LASIX) 40 MG tablet TAKE ONE TABLET BY MOUTH EVERY DAY FOR BLOOD PRESSURE AND FOR FLUID CONTROL 12/16/20   [provider]  ?gabapentin (NEURONTIN) 300 MG capsule Take 1 capsule by mouth in the morning and at bedtime. 12/25/20   [provider]  ?gabapentin (NEURONTIN) 600 MG tablet Take by mouth. 03/27/21   [provider]  ?hydrALAZINE (APRESOLINE) 50 MG tablet TAKE ONE TABLET BY MOUTH THREE TIMES A DAY FOR BLOOD PRESSURE 12/16/20   [provider]  ?losartan (COZAAR) 100 MG tablet Take 100 mg by mouth daily. 09/25/20   [provider]  ?metFORMIN (GLUCOPHAGE) 1000 MG tablet Take 1 tablet (1,000 mg total) by mouth 2 (two) times daily with a meal. 10/16/20 10/16/21  Vigg, Avanti, MD  ?rosuvastatin (CRESTOR) 40 MG tablet Take 1 tablet (40 mg total) by mouth at bedtime. 04/17/20   Myles Gip, DO  ?tamsulosin (FLOMAX) 0.4  MG CAPS capsule Take 1 capsule (0.4 mg total) by mouth daily. Patient restarted on his own due to trouble urinating 10/16/20   Vigg, Avanti, MD  ?Tiotropium Bromide Monohydrate (SPIRIVA RESPIMAT) 2.5 MCG/ACT AERS Inhale 2 puffs into the lungs daily. 05/15/20   Charlynne Cousins, MD  ?Torsemide 40 MG TABS Take by mouth. 04/14/21 04/14/22  [provider]  ?vitamin B-12 (CYANOCOBALAMIN) 500 MCG tablet Take 2,000 mcg by mouth at bedtime.    [provider]  ?Vitamin D, Cholecalciferol, 25 MCG (1000 UT) CAPS Take 2,000 mcg by mouth at bedtime.    [provider]  ? ? ?Physical Exam: ?BP 137/75   Pulse 72   Temp 100 ?F (37.8 ?C) (Oral)   Resp 12   Ht '5\' 10"'$  (1.778 m)   Wt 120.2 kg   SpO2 95%   BMI 38.02 kg/m?  ? ?General: 81 y.o. year-old male morbidly obese in no acute distress.  Alert and oriented x3. ?Cardiovascular: Irregular rate and  rhythm with no rubs or gallops.  No thyromegaly or JVD noted.  B/L lower extremity edema.  ?Respiratory: Clear to auscultation with no wheezes or rales. Good inspiratory effort. ?Abdomen: Soft nontender nondistended

## 2021-06-25 NOTE — Progress Notes (Signed)
Anticoagulation monitoring(Lovenox): ? ?81yo  M ordered Lovenox 40 mg Q24h ?   ?Filed Weights  ? 06/25/21 1842  ?Weight: 120.2 kg (265 lb)  ? ?BMI 38  ? ?Lab Results  ?Component Value Date  ? CREATININE 1.21 06/25/2021  ? CREATININE 1.37 (H) 04/22/2021  ? CREATININE 1.04 01/09/2021  ? ?Estimated Creatinine Clearance: 62.2 mL/min (by C-G formula based on SCr of 1.21 mg/dL). ?Hemoglobin & Hematocrit  ?   ?Component Value Date/Time  ? HGB 12.8 (L) 06/25/2021 1837  ? HGB 13.1 04/22/2021 1352  ? HCT 38.1 (L) 06/25/2021 1837  ? HCT 40.0 04/22/2021 1352  ? ? ? ?Per Protocol for Patient with estCrcl > 30 ml/min and BMI > 30, will transition to Lovenox 0.5 mg/kg Q24h  ?  ?  ?Chinita [place] PharmD ?Clinical Pharmacist ?06/25/2021 ? ?

## 2021-06-25 NOTE — ED Notes (Signed)
Report given, but RN has to set up for precautions. Admitting MD at bedside ?

## 2021-06-25 NOTE — ED Notes (Signed)
XR at bedside

## 2021-06-26 ENCOUNTER — Encounter: Payer: Self-pay | Admitting: Internal Medicine

## 2021-06-26 DIAGNOSIS — R531 Weakness: Secondary | ICD-10-CM | POA: Diagnosis not present

## 2021-06-26 LAB — BASIC METABOLIC PANEL
Anion gap: 7 (ref 5–15)
BUN: 16 mg/dL (ref 8–23)
CO2: 29 mmol/L (ref 22–32)
Calcium: 9.4 mg/dL (ref 8.9–10.3)
Chloride: 100 mmol/L (ref 98–111)
Creatinine, Ser: 1.5 mg/dL — ABNORMAL HIGH (ref 0.61–1.24)
GFR, Estimated: 46 mL/min — ABNORMAL LOW (ref >60)
Glucose, Bld: 188 mg/dL — ABNORMAL HIGH (ref 70–99)
Potassium: 3.6 mmol/L (ref 3.5–5.1)
Sodium: 136 mmol/L (ref 135–145)

## 2021-06-26 LAB — CBC
HCT: 34.6 % — ABNORMAL LOW (ref 39.0–52.0)
Hemoglobin: 11.7 g/dL — ABNORMAL LOW (ref 13.0–17.0)
MCH: 29.8 pg (ref 26.0–34.0)
MCHC: 33.8 g/dL (ref 30.0–36.0)
MCV: 88.3 fL (ref 80.0–100.0)
Platelets: 191 10*3/uL (ref 150–400)
RBC: 3.92 MIL/uL — ABNORMAL LOW (ref 4.22–5.81)
RDW: 13.1 % (ref 11.5–15.5)
WBC: 11.8 10*3/uL — ABNORMAL HIGH (ref 4.0–10.5)
nRBC: 0 % (ref 0.0–0.2)

## 2021-06-26 LAB — GLUCOSE, CAPILLARY
Glucose-Capillary: 177 mg/dL — ABNORMAL HIGH (ref 70–99)
Glucose-Capillary: 191 mg/dL — ABNORMAL HIGH (ref 70–99)
Glucose-Capillary: 195 mg/dL — ABNORMAL HIGH (ref 70–99)

## 2021-06-26 LAB — MAGNESIUM: Magnesium: 1.5 mg/dL — ABNORMAL LOW (ref 1.7–2.4)

## 2021-06-26 MED ORDER — CEPHALEXIN 500 MG PO CAPS: 500.0000 mg | ORAL_CAPSULE | Freq: Three times a day (TID) | ORAL | 0 refills | Status: AC

## 2021-06-26 MED ORDER — MAGNESIUM SULFATE 2 GM/50ML IV SOLN
2.0000 g | Freq: Once | INTRAVENOUS | Status: AC
  Administered 2021-06-26: 2 g via INTRAVENOUS
  Filled 2021-06-26: qty 50

## 2021-06-26 MED ORDER — MAGNESIUM OXIDE -MG SUPPLEMENT 400 (240 MG) MG PO TABS: 400.0000 mg | ORAL_TABLET | Freq: Every day | ORAL | Status: DC

## 2021-06-26 MED ORDER — GABAPENTIN 100 MG PO CAPS
100.0000 mg | ORAL_CAPSULE | Freq: Two times a day (BID) | ORAL | 0 refills | Status: DC
Start: 2021-06-26 — End: 2021-09-18

## 2021-06-26 MED ORDER — CLONIDINE HCL 0.1 MG PO TABS
0.1000 mg | ORAL_TABLET | Freq: Two times a day (BID) | ORAL | Status: DC
  Administered 2021-06-26: 0.1 mg via ORAL
  Filled 2021-06-26: qty 1

## 2021-06-26 MED ORDER — CARVEDILOL 25 MG PO TABS
25.0000 mg | ORAL_TABLET | Freq: Two times a day (BID) | ORAL | Status: DC
  Administered 2021-06-26: 25 mg via ORAL
  Filled 2021-06-26: qty 1

## 2021-06-26 MED ORDER — POTASSIUM CHLORIDE CRYS ER 20 MEQ PO TBCR
40.0000 meq | EXTENDED_RELEASE_TABLET | Freq: Once | ORAL | Status: AC
  Administered 2021-06-26: 40 meq via ORAL
  Filled 2021-06-26: qty 2

## 2021-06-26 MED ORDER — MAGNESIUM OXIDE -MG SUPPLEMENT 400 (240 MG) MG PO TABS: 400.0000 mg | ORAL_TABLET | Freq: Every day | ORAL | 0 refills | Status: DC

## 2021-06-26 MED ORDER — HYDRALAZINE HCL 50 MG PO TABS
50.0000 mg | ORAL_TABLET | Freq: Three times a day (TID) | ORAL | Status: DC
  Administered 2021-06-26: 50 mg via ORAL
  Filled 2021-06-26: qty 1

## 2021-06-26 MED ORDER — SODIUM CHLORIDE 0.9 % IV SOLN: INTRAVENOUS | Status: DC

## 2021-06-26 MED ORDER — LOSARTAN POTASSIUM 50 MG PO TABS: 100.0000 mg | ORAL_TABLET | Freq: Every day | ORAL | Status: DC

## 2021-06-26 NOTE — Progress Notes (Signed)
Patient ready for DC. BP elevated, patient states always elevated at home. Scheduled BP meds given as they were previously being held. Patient states just will take evening BP meds at home. Not symptomatic. MD aware. Will monitor until DC. ?

## 2021-06-26 NOTE — Discharge Summary (Signed)
?Physician Discharge Summary ?  ?Patient: Justin Griffith MRN: [id number] DOB: [birth year]  ?Admit date:     06/25/2021  ?Discharge date: 06/26/21  ?Discharge Physician: Lorella Nimrod  ? ?PCP: Charlynne Cousins, MD  ? ?Recommendations at discharge:  ?Please follow-up urine culture results ?Please obtain CBC and BMP within a week ?Follow-up with primary care doctor in 1 week ? ?Discharge Diagnoses: ?Principal Problem: ?  Generalized weakness ? ? ?Hospital Course: ?Taken from H&P. ? ? [name] is a 81 y.o. male with medical history significant for type 2 diabetes, diabetic polyneuropathy, hyperlipidemia, hypertension, morbid obesity, benign essential tremors, OSA on CPAP, prior CVA, BPH, chronic venous insufficiency, coronary artery disease, COPD, hearing loss, who presented to [facility] ED from home after recurrent falls.  Associated with generalized weakness.  Patient endorses that he fell twice today.  No loss of consciousness.  Did not hit his head.  States he feels weak in his legs.  He attributes his symptoms to polyneuropathy.  Per EMS assessment patient was febrile with Tmax of 102.8, tachycardic 105, tachypneic 25.  He was also noted to have O2 saturation of 91% on room air.  He was placed on 2 L nasal cannula by EMS.  Work-up in the ED revealed presumptive UTI.  As UA with pyuria, urine cultures pending. ? ?He was also found to have bedbugs and placed on contact precautions. ? ?Patient met sepsis criteria with fever, mild leukocytosis, tachycardia and tachypnea, most likely secondary to UTI.  Blood cultures remain negative so far and urine cultures pending. ? ?Patient did not receive much IV fluid in ED, placed on ceftriaxone for UTI. ?Next morning creatinine increased to 1.5 from 1.21 on admission. ?Giving some IV fluid and encouraging p.o. hydration. ?PT evaluated him and they are recommending home health PT which was ordered. ? ?Patient really wants to go home and does not want to spend another night. ?He is being  discharged on a course of Keflex.  No prior positive urine culture. ?If his culture shows a resistant to Keflex we can call him and make changes to his antibiotics. ? ?He was also found to have hypokalemia and hypomagnesemia which were repleted.  Patient is on diuretic at home for his history of HFpEF.  Appears euvolemic.  ? He was complaining of frequent cramping of lower extremities which can be due to hypomagnesemia, he was also started on p.o. magnesium supplement and PCP can monitor as an outpatient. ? ?He need to follow-up with his primary care provider within next few days for further recommendations. ? ? ?Consultants: None ?Procedures performed: None ?Disposition: Home health ?Diet recommendation:  ?Discharge Diet Orders (From admission, onward)  ? ?  Start     Ordered  ? 06/26/21 0000  Diet - low sodium heart healthy       ? 06/26/21 1529  ? ?  ?  ? ?  ? ?Cardiac and Carb modified diet ?DISCHARGE MEDICATION: ?Allergies as of 06/26/2021   ? ?   Reactions  ? Wilder Glade [dapagliflozin] Rash  ? Causes a severe rash in groin area  ? Dulaglutide Diarrhea, Other (See Comments)  ? Liraglutide Diarrhea, Other (See Comments)  ? Jardiance [empagliflozin] Rash  ? ?  ? ?  ?Medication List  ?  ? ?STOP taking these medications   ? ?clotrimazole-betamethasone cream ?Commonly known as: Lotrisone ?  ?furosemide 40 MG tablet ?Commonly known as: LASIX ?  ? ?  ? ?TAKE these medications   ? ?acyclovir 400 MG  tablet ?Commonly known as: ZOVIRAX ?Take 400 mg by mouth 2 (two) times daily. ?  ?aspirin 81 MG EC tablet ?Take 1 tablet (81 mg total) by mouth daily. Swallow whole. ?  ?carvedilol 25 MG tablet ?Commonly known as: COREG ?TAKE ONE TABLET BY MOUTH TWO TIMES A DAY FOR BLOOD PRESSURE ?  ?cephALEXin 500 MG capsule ?Commonly known as: KEFLEX ?Take 1 capsule (500 mg total) by mouth 3 (three) times daily for 5 days. ?  ?cloNIDine 0.1 MG tablet ?Commonly known as: CATAPRES ?Take 0.1 mg by mouth 2 (two) times daily. ?   ?Fluticasone-Salmeterol 100-50 MCG/DOSE Aepb ?Commonly known as: ADVAIR ?Inhale 1 puff into the lungs 2 (two) times daily. ?  ?gabapentin 100 MG capsule ?Commonly known as: NEURONTIN ?Take 1 capsule (100 mg total) by mouth 2 (two) times daily. ?What changed:  ?medication strength ?how much to take ?when to take this ?Another medication with the same name was removed. Continue taking this medication, and follow the directions you see here. ?  ?hydrALAZINE 50 MG tablet ?Commonly known as: APRESOLINE ?TAKE ONE TABLET BY MOUTH THREE TIMES A DAY FOR BLOOD PRESSURE ?  ?losartan 100 MG tablet ?Commonly known as: COZAAR ?Take 100 mg by mouth daily. ?  ?magnesium oxide 400 (240 Mg) MG tablet ?Commonly known as: MAG-OX ?Take 1 tablet (400 mg total) by mouth daily. ?Start taking on: June 27, 2021 ?  ?metFORMIN 1000 MG tablet ?Commonly known as: GLUCOPHAGE ?Take 1 tablet (1,000 mg total) by mouth 2 (two) times daily with a meal. ?  ?rosuvastatin 40 MG tablet ?Commonly known as: CRESTOR ?Take 1 tablet (40 mg total) by mouth at bedtime. ?  ?Spiriva Respimat 2.5 MCG/ACT Aers ?Generic drug: Tiotropium Bromide Monohydrate ?Inhale 2 puffs into the lungs daily. ?  ?tamsulosin 0.4 MG Caps capsule ?Commonly known as: FLOMAX ?Take 1 capsule (0.4 mg total) by mouth daily. Patient restarted on his own due to trouble urinating ?  ?Torsemide 40 MG Tabs ?Take 80 mg by mouth 2 (two) times daily. ?  ?vitamin B-12 500 MCG tablet ?Commonly known as: CYANOCOBALAMIN ?Take 2,000 mcg by mouth at bedtime. ?  ?Vitamin D (Cholecalciferol) 25 MCG (1000 UT) Caps ?Take 2,000 mcg by mouth at bedtime. ?  ? ?  ? ? Follow-up Information   ? ? Vigg, Avanti, MD. Schedule an appointment as soon as possible for a visit in 1 week(s).   ?Specialty: Internal Medicine ?Contact information: ?[address] ?[place] [address] ?[phone] ? ? ?  ?  ? ?  ?  ? ?  ? ?Discharge Exam: ?Filed Weights  ? 06/25/21 1842 06/26/21 0500  ?Weight: 120.2 kg 120.2 kg  ? ?General.     In  no acute distress. ?Pulmonary.  Lungs clear bilaterally, normal respiratory effort. ?CV.  Regular rate and rhythm, no JVD, rub or murmur. ?Abdomen.  Soft, nontender, nondistended, BS positive. ?CNS.  Alert and oriented.  No focal neurologic deficit. ?Extremities.  No edema, no cyanosis, pulses intact and symmetrical. ?Psychiatry.  Judgment and insight appears normal.  ? ?Condition at discharge: stable ? ?The results of significant diagnostics from this hospitalization (including imaging, microbiology, ancillary and laboratory) are listed below for reference.  ? ?Imaging Studies: ?DG Chest Port 1 View ? ?Result Date: 06/25/2021 ?CLINICAL DATA:  Suspected sepsis, fall EXAM: PORTABLE CHEST 1 VIEW COMPARISON:  05/17/2020 FINDINGS: Normal cardiac and mediastinal contours. Aortic atherosclerosis. No new focal pulmonary opacity. No definite pleural effusion or pneumothorax. No displaced fracture. IMPRESSION: No acute  cardiopulmonary process. Electronically Signed   By: Merilyn Baba M.D.   On: 06/25/2021 19:08   ? ?Microbiology: ?Results for orders placed or performed during the hospital encounter of 06/25/21  ?Culture, blood (Routine x 2)     Status: None (Preliminary result)  ? Collection Time: 06/25/21  6:37 PM  ? Specimen: BLOOD  ?Result Value Ref Range Status  ? Specimen Description BLOOD BLOOD LEFT FOREARM  Final  ? Special Requests   Final  ?  BOTTLES DRAWN AEROBIC AND ANAEROBIC Blood Culture results may not be optimal due to an excessive volume of blood received in culture bottles  ? Culture   Final  ?  NO GROWTH < 12 HOURS ?Performed at [facility], [address], [place], [place] [address] ?  ? Report Status PENDING  Incomplete  ?Culture, blood (Routine x 2)     Status: None (Preliminary result)  ? Collection Time: 06/25/21  6:37 PM  ? Specimen: BLOOD  ?Result Value Ref Range Status  ? Specimen Description BLOOD BLOOD LEFT HAND  Final  ? Special Requests   Final  ?  BOTTLES DRAWN AEROBIC AND ANAEROBIC  Blood Culture adequate volume  ? Culture   Final  ?  NO GROWTH < 12 HOURS ?Performed at [facility], [address], [place], [place] [address] ?  ? Report Status PENDING  Incomplete  ? ? ?Labs: ?CBC: ?Recent L

## 2021-06-26 NOTE — Evaluation (Signed)
Occupational Therapy Evaluation ?Patient Details ?Name: Justin Griffith ?MRN: [id number] ?DOB: 1941/03/19 ?Today's Date: 06/26/2021 ? ? ?History of Present Illness Justin JUSTINIANO is a 81 y.o. male with medical history significant for type 2 diabetes, diabetic polyneuropathy, hyperlipidemia, hypertension, morbid obesity, benign essential tremors, OSA on CPAP, prior CVA, BPH, chronic venous insufficiency, coronary artery disease, COPD, hearing loss, who presented to [facility] ED from home after recurrent falls.  Associated with generalized weakness.  Patient endorses that he fell twice today.  No loss of consciousness.  Did not hit his head.  States he feels weak in his legs.  ? ?Clinical Impression ?  ?Patient presenting with decreased Ind in self care, balance, functional mobility/transfers, endurance, and safety awareness. Patient reports living at home with wife. He is mod I with use of SPC or RW depending on how he feels. He is independent with self care tasks. His wife has memory deficits and he is available for safety for her but does not give her physical assist. Patient currently functioning at supervision - mod I with use of RW. He reports feeling close to his baseline. Patient will benefit from acute OT to increase overall independence in the areas of ADLs, functional mobility, and safety awareness in order to safely discharge home with wife.  ?   ? ?Recommendations for follow up therapy are one component of a multi-disciplinary discharge planning process, led by the attending physician.  Recommendations may be updated based on patient status, additional functional criteria and insurance authorization.  ? ?Follow Up Recommendations ? No OT follow up  ?  ?Assistance Recommended at Discharge Set up Supervision/Assistance  ?Patient can return home with the following A little help with bathing/dressing/bathroom;Help with stairs or ramp for entrance;Assist for transportation ? ?  ?Functional Status Assessment ? Patient  has had a recent decline in their functional status and demonstrates the ability to make significant improvements in function in a reasonable and predictable amount of time.  ?Equipment Recommendations ? None recommended by OT  ?  ?   ?Precautions / Restrictions Precautions ?Precautions: Fall ?Restrictions ?Weight Bearing Restrictions: No  ? ?  ? ?Mobility Bed Mobility ?  ?  ?  ?  ?  ?  ?  ?General bed mobility comments: seated in recliner chair ?  ? ?Transfers ?Overall transfer level: Modified independent ?Equipment used: Rolling walker (2 wheels) ?  ?  ?  ?  ?  ?  ?  ?General transfer comment: no physical assist to stand from recliner chair ?  ? ?  ?Balance Overall balance assessment: Modified Independent, Mild deficits observed, not formally tested ?  ?  ?  ?  ?  ?  ?  ?  ?  ?  ?  ?  ?  ?  ?  ?  ?  ?  ?   ? ?ADL either performed or assessed with clinical judgement  ? ?ADL   ?  ?  ?  ?  ?  ?  ?  ?  ?  ?  ?  ?  ?  ?  ?  ?  ?  ?  ?  ?General ADL Comments: supervison - mod I with use of RW  ? ? ? ?Vision Patient Visual Report: No change from baseline ?   ?   ?   ?   ? ?Pertinent Vitals/Pain Pain Assessment ?Pain Assessment: No/denies pain  ? ? ? ?Hand Dominance Right ?  ?Extremity/Trunk Assessment Upper Extremity Assessment ?Upper Extremity  Assessment: Generalized weakness ?  ?Lower Extremity Assessment ?Lower Extremity Assessment: Generalized weakness ?  ?Cervical / Trunk Assessment ?Cervical / Trunk Assessment: Normal ?  ?Communication Communication ?Communication: HOH ?  ?Cognition Arousal/Alertness: Awake/alert ?Behavior During Therapy: [facility] for tasks assessed/performed ?Overall Cognitive Status: Within Functional Limits for tasks assessed ?  ?  ?  ?  ?  ?  ?  ?  ?  ?  ?  ?  ?  ?  ?  ?  ?General Comments: pleasant and cooperative ?  ?  ?   ?   ?   ? ? ?Home Living Family/patient expects to be discharged to:: Private residence ?Living Arrangements: Spouse/significant other ?Available Help at Discharge:  Family;Available 24 hours/day ?Type of Home: House ?Home Access: Stairs to enter ?Entrance Stairs-Number of Steps: 1 ?  ?Home Layout: One level ?  ?  ?Bathroom Shower/Tub: Walk-in shower ?  ?  ?  ?  ?Home Equipment: Conservation officer, nature (2 wheels);Cane - single point;Grab bars - tub/shower ?  ?  ?  ? ?  ?Prior Functioning/Environment Prior Level of Function : Independent/Modified Independent ?  ?  ?  ?  ?  ?  ?Mobility Comments: patient states he uses cane or walker depending on how he feels ?ADLs Comments: Independent prior to admission ?  ? ?  ?  ?OT Problem List: Decreased strength;Decreased activity tolerance;Decreased safety awareness ?  ?   ?OT Treatment/Interventions: Self-care/ADL training;Therapeutic exercise;Therapeutic activities;Energy conservation;DME and/or AE instruction;Manual therapy;Balance training;Patient/family education  ?  ?OT Goals(Current goals can be found in the care plan section) Acute Rehab OT Goals ?Patient Stated Goal: to go home ?OT Goal Formulation: With patient ?Time For Goal Achievement: 07/10/21 ?Potential to Achieve Goals: Good ?ADL Goals ?Pt Will Perform Grooming: with modified independence ?Pt Will Perform Lower Body Dressing: with modified independence ?Pt Will Transfer to Toilet: with modified independence ?Pt Will Perform Toileting - Clothing Manipulation and hygiene: with modified independence  ?OT Frequency: Min 2X/week ?  ? ?AM-PAC OT "6 Clicks" Daily Activity     ?Outcome Measure Help from another person eating meals?: None ?Help from another person taking care of personal grooming?: None ?Help from another person toileting, which includes using toliet, bedpan, or urinal?: None ?Help from another person bathing (including washing, rinsing, drying)?: A Little ?Help from another person to put on and taking off regular upper body clothing?: None ?Help from another person to put on and taking off regular lower body clothing?: None ?6 Click Score: 23 ?  ?End of Session Equipment  Utilized During Treatment: Rolling walker (2 wheels) ?Nurse Communication: Mobility status ? ?Activity Tolerance: Patient tolerated treatment well;Patient limited by fatigue ?Patient left: in bed;with call bell/phone within reach;with bed alarm set ? ?OT Visit Diagnosis: Unsteadiness on feet (R26.81);Repeated falls (R29.6);Muscle weakness (generalized) (M62.81)  ?              ?Time: 1203-1220 ?OT Time Calculation (min): 17 min ?Charges:  OT General Charges ?$OT Visit: 1 Visit ?OT Evaluation ?$OT Eval Low Complexity: 1 Low ?OT Treatments ?$Self Care/Home Management : 8-22 mins ? ?Darleen Crocker, [place], OTR/L , CBIS ?ascom [phone]  ?06/26/21, 1:08 PM  ?

## 2021-06-26 NOTE — Hospital Course (Addendum)
Taken from H&P. ? ? Justin Griffith is a 81 y.o. male with medical history significant for type 2 diabetes, diabetic polyneuropathy, hyperlipidemia, hypertension, morbid obesity, benign essential tremors, OSA on CPAP, prior CVA, BPH, chronic venous insufficiency, coronary artery disease, COPD, hearing loss, who presented to [facility] ED from home after recurrent falls.  Associated with generalized weakness.  Patient endorses that he fell twice today.  No loss of consciousness.  Did not hit his head.  States he feels weak in his legs.  He attributes his symptoms to polyneuropathy.  Per EMS assessment patient was febrile with Tmax of 102.8, tachycardic 105, tachypneic 25.  He was also noted to have O2 saturation of 91% on room air.  He was placed on 2 L nasal cannula by EMS.  Work-up in the ED revealed presumptive UTI.  As UA with pyuria, urine cultures pending. ? ?He was also found to have bedbugs and placed on contact precautions. ? ?Patient met sepsis criteria with fever, mild leukocytosis, tachycardia and tachypnea, most likely secondary to UTI.  Blood cultures remain negative so far and urine cultures pending. ? ?Patient did not receive much IV fluid in ED, placed on ceftriaxone for UTI. ?Next morning creatinine increased to 1.5 from 1.21 on admission. ?Giving some IV fluid and encouraging p.o. hydration. ?PT evaluated him and they are recommending home health PT which was ordered. ? ?Patient really wants to go home and does not want to spend another night. ?He is being discharged on a course of Keflex.  No prior positive urine culture. ?If his culture shows a resistant to Keflex we can call him and make changes to his antibiotics. ? ?He was also found to have hypokalemia and hypomagnesemia which were repleted.  Patient is on diuretic at home for his history of HFpEF.  Appears euvolemic.  ? He was complaining of frequent cramping of lower extremities which can be due to hypomagnesemia, he was also started on p.o.  magnesium supplement and PCP can monitor as an outpatient. ? ?He need to follow-up with his primary care provider within next few days for further recommendations. ?

## 2021-06-26 NOTE — Evaluation (Signed)
Physical Therapy Evaluation ?Patient Details ?Name: Justin Griffith ?MRN: [id number] ?DOB: 05-23-1940 ?Today's Date: 06/26/2021 ? ?History of Present Illness ? Justin Griffith is a 81 y.o. male with medical history significant for type 2 diabetes, diabetic polyneuropathy, hyperlipidemia, hypertension, morbid obesity, benign essential tremors, OSA on CPAP, prior CVA, BPH, chronic venous insufficiency, coronary artery disease, COPD, hearing loss, who presented to [facility] ED from home after recurrent falls.  Associated with generalized weakness.  Patient endorses that he fell twice today.  No loss of consciousness.  Did not hit his head.  States he feels weak in his legs. ?  ?Clinical Impression ? Patient received in bed, he is very HOH. Alert, oriented. Agreeable to PT assessment. Patient found to be soaking wet in the bed. RN arrived in room and assisted with changing patient and linens. He required min A for supine to sit. He is able to sit at edge of bed unsupported. Sit to stand with supervision and ambulated 5 feet with RW to recliner, supervision. He appears to be close to baseline mobility. He would benefit from HHPT.     ?   ? ?Recommendations for follow up therapy are one component of a multi-disciplinary discharge planning process, led by the attending physician.  Recommendations may be updated based on patient status, additional functional criteria and insurance authorization. ? ?Follow Up Recommendations Home health PT ? ?  ?Assistance Recommended at Discharge Intermittent Supervision/Assistance  ?Patient can return home with the following ? Help with stairs or ramp for entrance;Assist for transportation;Assistance with cooking/housework ? ?  ?Equipment Recommendations None recommended by PT  ?Recommendations for Other Services ?    ?  ?Functional Status Assessment Patient has had a recent decline in their functional status and demonstrates the ability to make significant improvements in function in a reasonable  and predictable amount of time.  ? ?  ?Precautions / Restrictions Precautions ?Precautions: Fall ?Restrictions ?Weight Bearing Restrictions: No  ? ?  ? ?Mobility ? Bed Mobility ?Overal bed mobility: Needs Assistance ?Bed Mobility: Supine to Sit ?  ?  ?Supine to sit: Min assist ?  ?  ?General bed mobility comments: min assist to raise trunk to seated position, increased effort ?  ? ?Transfers ?Overall transfer level: Modified independent ?Equipment used: Rolling walker (2 wheels) ?  ?  ?  ?  ?  ?  ?  ?General transfer comment: able to power up to standing from bed and recliner without assist ?  ? ?Ambulation/Gait ?Ambulation/Gait assistance: Supervision ?Gait Distance (Feet): 5 Feet ?Assistive device: Rolling walker (2 wheels) ?Gait Pattern/deviations: Step-through pattern, Decreased step length - right, Decreased step length - left, Decreased stride length, Wide base of support ?Gait velocity: decr ?  ?  ?General Gait Details: patient ambulated from bed to recliner with RW and supervision. No lob, no difficulty reported or observed. Limited by space/lines in room. ? ?Stairs ?  ?  ?  ?  ?  ? ?Wheelchair Mobility ?  ? ?Modified Rankin (Stroke Patients Only) ?  ? ?  ? ?Balance Overall balance assessment: Modified Independent, Mild deficits observed, not formally tested ?  ?  ?  ?  ?  ?  ?  ?  ?  ?  ?  ?  ?  ?  ?  ?  ?  ?  ?   ? ? ? ?Pertinent Vitals/Pain Pain Assessment ?Pain Assessment: No/denies pain  ? ? ?Home Living Family/patient expects to be discharged to:: Private residence ?Living Arrangements:  Spouse/significant other ?Available Help at Discharge: Family;Available 24 hours/day ?Type of Home: House ?Home Access: Stairs to enter ?  ?Entrance Stairs-Number of Steps: 1 ?  ?Home Layout: One level ?Home Equipment: Conservation officer, nature (2 wheels);Cane - single point ?   ?  ?Prior Function Prior Level of Function : Independent/Modified Independent ?  ?  ?  ?  ?  ?  ?Mobility Comments: patient states he uses cane or walker  depending on how he feels ?ADLs Comments: Independent prior to admission ?  ? ? ?Hand Dominance  ?   ? ?  ?Extremity/Trunk Assessment  ? Upper Extremity Assessment ?Upper Extremity Assessment: Defer to OT evaluation ?  ? ?Lower Extremity Assessment ?Lower Extremity Assessment: Generalized weakness ?  ? ?Cervical / Trunk Assessment ?Cervical / Trunk Assessment: Normal  ?Communication  ? Communication: HOH  ?Cognition Arousal/Alertness: Awake/alert ?Behavior During Therapy: [facility] for tasks assessed/performed ?Overall Cognitive Status: Within Functional Limits for tasks assessed ?  ?  ?  ?  ?  ?  ?  ?  ?  ?  ?  ?  ?  ?  ?  ?  ?  ?  ?  ? ?  ?General Comments   ? ?  ?Exercises    ? ?Assessment/Plan  ?  ?PT Assessment Patient needs continued PT services  ?PT Problem List Decreased strength;Decreased mobility;Decreased activity tolerance;Decreased balance;Decreased safety awareness ? ?   ?  ?PT Treatment Interventions Therapeutic exercise;Gait training;Stair training;Functional mobility training;Therapeutic activities;Patient/family education;Balance training   ? ?PT Goals (Current goals can be found in the Care Plan section)  ?Acute Rehab PT Goals ?Patient Stated Goal: to go home ?PT Goal Formulation: With patient ?Time For Goal Achievement: 07/03/21 ?Potential to Achieve Goals: Good ? ?  ?Frequency Min 2X/week ?  ? ? ?Co-evaluation   ?  ?  ?  ?  ? ? ?  ?AM-PAC PT "6 Clicks" Mobility  ?Outcome Measure Help needed turning from your back to your side while in a flat bed without using bedrails?: None ?Help needed moving from lying on your back to sitting on the side of a flat bed without using bedrails?: A Little ?Help needed moving to and from a bed to a chair (including a wheelchair)?: None ?Help needed standing up from a chair using your arms (e.g., wheelchair or bedside chair)?: None ?Help needed to walk in hospital room?: None ?Help needed climbing 3-5 steps with a railing? : A Little ?6 Click Score: 22 ? ?  ?End of  Session   ?Activity Tolerance: Patient tolerated treatment well ?Patient left: in chair;with call bell/phone within reach;with chair alarm set;with nursing/sitter in room ?Nurse Communication: Mobility status ?PT Visit Diagnosis: Muscle weakness (generalized) (M62.81);Repeated falls (R29.6) ?  ? ?Time: 7035-0093 ?PT Time Calculation (min) (ACUTE ONLY): 30 min ? ? ?Charges:   PT Evaluation ?$PT Eval Moderate Complexity: 1 Mod ?PT Treatments ?$Gait Training: 8-22 mins ?  ?   ? ? ?Amanda Cockayne, PT, GCS ?06/26/21,12:28 PM ? ? ?

## 2021-06-26 NOTE — Plan of Care (Signed)
Initial Add ?Problem: Education: ?Goal: Knowledge of General Education information will improve ?Description: Including pain rating scale, medication(s)/side effects and non-pharmacologic comfort measures ?Outcome: Not Applicable ?  ?Problem: Health Behavior/Discharge Planning: ?Goal: Ability to manage health-related needs will improve ?Outcome: Not Applicable ?  ?Problem: Clinical Measurements: ?Goal: Ability to maintain clinical measurements within normal limits will improve ?Outcome: Not Applicable ?Goal: Will remain free from infection ?Outcome: Not Applicable ?Goal: Diagnostic test results will improve ?Outcome: Not Applicable ?Goal: Respiratory complications will improve ?Outcome: Not Applicable ?Goal: Cardiovascular complication will be avoided ?Outcome: Not Applicable ?  ?Problem: Activity: ?Goal: Risk for activity intolerance will decrease ?Outcome: Not Applicable ?  ?Problem: Nutrition: ?Goal: Adequate nutrition will be maintained ?Outcome: Not Applicable ?  ?Problem: Coping: ?Goal: Level of anxiety will decrease ?Outcome: Not Applicable ?  ?Problem: Elimination: ?Goal: Will not experience complications related to bowel motility ?Outcome: Not Applicable ?Goal: Will not experience complications related to urinary retention ?Outcome: Not Applicable ?  ?Problem: Pain Managment: ?Goal: General experience of comfort will improve ?Outcome: Not Applicable ?  ?Problem: Safety: ?Goal: Ability to remain free from injury will improve ?Outcome: Not Applicable ?  ?Problem: Skin Integrity: ?Goal: Risk for impaired skin integrity will decrease ?Outcome: Not Applicable ?  ?

## 2021-06-27 LAB — HEMOGLOBIN A1C
Hgb A1c MFr Bld: 8.9 % — ABNORMAL HIGH (ref 4.8–5.6)
Mean Plasma Glucose: 209 mg/dL

## 2021-06-29 LAB — URINE CULTURE: Culture: >=100000 — AB

## 2021-06-30 LAB — CULTURE, BLOOD (ROUTINE X 2)
Culture: NO GROWTH
Culture: NO GROWTH
Special Requests: ADEQUATE

## 2021-07-08 ENCOUNTER — Ambulatory Visit (INDEPENDENT_AMBULATORY_CARE_PROVIDER_SITE_OTHER): Payer: HMO | Admitting: Internal Medicine

## 2021-07-08 ENCOUNTER — Encounter: Payer: Self-pay | Admitting: Internal Medicine

## 2021-07-08 VITALS — BP 160/80 | HR 80 | Temp 98.7°F | Ht 70.0 in | Wt 268.6 lb

## 2021-07-08 DIAGNOSIS — N179 Acute kidney failure, unspecified: Secondary | ICD-10-CM | POA: Diagnosis not present

## 2021-07-08 DIAGNOSIS — N390 Urinary tract infection, site not specified: Secondary | ICD-10-CM

## 2021-07-08 LAB — CBC WITH DIFFERENTIAL/PLATELET
Hematocrit: 39.3 % (ref 37.5–51.0)
Hemoglobin: 13.8 g/dL (ref 13.0–17.7)
Lymphocytes Absolute: 1.7 10*3/uL (ref 0.7–3.1)
Lymphs: 26 %
MCH: 30.3 pg (ref 26.6–33.0)
MCHC: 35.1 g/dL (ref 31.5–35.7)
MCV: 86 fL (ref 79–97)
MID (Absolute): 0.6 10*3/uL (ref 0.1–1.6)
MID: 10 %
Neutrophils Absolute: 4.3 10*3/uL (ref 1.4–7.0)
Neutrophils: 64 %
Platelets: 256 10*3/uL (ref 150–450)
RBC: 4.56 x10E6/uL (ref 4.14–5.80)
RDW: 13.5 % (ref 11.6–15.4)
WBC: 6.6 10*3/uL (ref 3.4–10.8)

## 2021-07-08 LAB — URINALYSIS, ROUTINE W REFLEX MICROSCOPIC
Bilirubin, UA: NEGATIVE
Ketones, UA: NEGATIVE
Leukocytes,UA: NEGATIVE
Nitrite, UA: NEGATIVE
RBC, UA: NEGATIVE
Specific Gravity, UA: 1.025 (ref 1.005–1.030)
Urobilinogen, Ur: 0.2 mg/dL (ref 0.2–1.0)
pH, UA: 6.5 (ref 5.0–7.5)

## 2021-07-08 LAB — MICROSCOPIC EXAMINATION
Bacteria, UA: NONE SEEN
Epithelial Cells (non renal): NONE SEEN /hpf (ref 0–10)
RBC, Urine: NONE SEEN /hpf (ref 0–2)
WBC, UA: NONE SEEN /hpf (ref 0–5)

## 2021-07-08 NOTE — Progress Notes (Signed)
? ?BP (!) 160/80   Pulse 80   Temp 98.7 ?F (37.1 ?C) (Oral)   Ht _0  (1.778 m)   Wt 268 lb 9.6 oz (121.8 kg)   SpO2 96%   BMI 38.54 kg/m?   ? ?Subjective:  ? ? Patient ID: Justin Griffith, male    DOB: Feb 18, 1941, 81 y.o.   MRN: [id number] ? ?Chief Complaint  ?Patient presents with  ? Hospitalization Follow-up  ? Fall  ? Urinary Tract Infection  ? ? ?HPI: ?Justin Griffith is a 81 y.o. male ? ?Pt is here for a hospital fu, had a Uti and was rx with iv rocephin and d/c on keflex. Pt had recurrent falls per his verbal record and went to the ER as he was feeling very weak, he was diagnosed with sepsis sec to a uti  Tmax of 102.8, tachycardic 105, tachypneic 25.  He was also noted to have O2 saturation of 91% on room air on arrival he was supposed to be sent home on po keflex however says he never picked it up from pharmacy.  ?He feels well today no new / recurrent symptoms vital wnl.  ? ? ?Fall ? ?Urinary Tract Infection  ? ? ?Chief Complaint  ?Patient presents with  ? Hospitalization Follow-up  ? Fall  ? Urinary Tract Infection  ? ? ?Relevant past medical, surgical, family and social history reviewed and updated as indicated. Interim medical history since our last visit reviewed. ?Allergies and medications reviewed and updated. ? ?Review of Systems ? ?Per HPI unless specifically indicated above ? ?   ?Objective:  ?  ?BP (!) 160/80   Pulse 80   Temp 98.7 ?F (37.1 ?C) (Oral)   Ht _1  (1.778 m)   Wt 268 lb 9.6 oz (121.8 kg)   SpO2 96%   BMI 38.54 kg/m?   ?Wt Readings from Last 3 Encounters:  ?07/08/21 268 lb 9.6 oz (121.8 kg)  ?06/26/21 264 lb 15.9 oz (120.2 kg)  ?04/22/21 268 lb 9.6 oz (121.8 kg)  ?  ?Physical Exam ?Vitals and nursing note reviewed.  ?Constitutional:   ?   General: He is not in acute distress. ?   Appearance: Normal appearance. He is not ill-appearing or diaphoretic.  ?HENT:  ?   Head: Normocephalic and atraumatic.  ?   Right Ear: Tympanic membrane and external ear normal. There is no  impacted cerumen.  ?   Left Ear: External ear normal.  ?   Nose: No congestion or rhinorrhea.  ?   Mouth/Throat:  ?   Pharynx: No oropharyngeal exudate or posterior oropharyngeal erythema.  ?Eyes:  ?   Conjunctiva/sclera: Conjunctivae normal.  ?   Pupils: Pupils are equal, round, and reactive to light.  ?Cardiovascular:  ?   Rate and Rhythm: Normal rate and regular rhythm.  ?   Heart sounds: No murmur heard. ?  No friction rub. No gallop.  ?Pulmonary:  ?   Effort: No respiratory distress.  ?   Breath sounds: No stridor. No wheezing or rhonchi.  ?Chest:  ?   Chest wall: No tenderness.  ?Abdominal:  ?   General: Abdomen is flat. Bowel sounds are normal.  ?   Palpations: Abdomen is soft. There is no mass.  ?   Tenderness: There is no abdominal tenderness.  ?Musculoskeletal:  ?   Cervical back: Normal range of motion and neck supple. No rigidity or tenderness.  ?   Left lower leg: No edema.  ?Skin: ?  General: Skin is warm and dry.  ?Neurological:  ?   Mental Status: He is alert.  ? ? ?Results for orders placed or performed in visit on 07/08/21  ?Microscopic Examination  ? BLD  ?Result Value Ref Range  ? WBC, UA None seen 0 - 5 /hpf  ? RBC None seen 0 - 2 /hpf  ? Epithelial Cells (non renal) None seen 0 - 10 /hpf  ? Mucus, UA Present (A) Not Estab.  ? Bacteria, UA None seen None seen/Few  ?CBC With Differential/Platelet  ?Result Value Ref Range  ? WBC 6.6 3.4 - 10.8 x10E3/uL  ? RBC 4.56 4.14 - 5.80 x10E6/uL  ? Hemoglobin 13.8 13.0 - 17.7 g/dL  ? Hematocrit 39.3 37.5 - 51.0 %  ? MCV 86 79 - 97 fL  ? MCH 30.3 26.6 - 33.0 pg  ? MCHC 35.1 31.5 - 35.7 g/dL  ? RDW 13.5 11.6 - 15.4 %  ? Platelets 256 150 - 450 x10E3/uL  ? Neutrophils 64 Not Estab. %  ? Lymphs 26 Not Estab. %  ? MID 10 Not Estab. %  ? Neutrophils Absolute 4.3 1.4 - 7.0 x10E3/uL  ? Lymphocytes Absolute 1.7 0.7 - 3.1 x10E3/uL  ? MID (Absolute) 0.6 0.1 - 1.6 X10E3/uL  ?CBC with Differential/Platelet  ?Result Value Ref Range  ? WBC 6.4 3.4 - 10.8 x10E3/uL  ? RBC  4.41 4.14 - 5.80 x10E6/uL  ? Hemoglobin 13.1 13.0 - 17.7 g/dL  ? Hematocrit 39.4 37.5 - 51.0 %  ? MCV 89 79 - 97 fL  ? MCH 29.7 26.6 - 33.0 pg  ? MCHC 33.2 31.5 - 35.7 g/dL  ? RDW 13.1 11.6 - 15.4 %  ? Platelets 236 150 - 450 x10E3/uL  ? Neutrophils 63 Not Estab. %  ? Lymphs 26 Not Estab. %  ? Monocytes 7 Not Estab. %  ? Eos 2 Not Estab. %  ? Basos 1 Not Estab. %  ? Neutrophils Absolute 4.1 1.4 - 7.0 x10E3/uL  ? Lymphocytes Absolute 1.7 0.7 - 3.1 x10E3/uL  ? Monocytes Absolute 0.4 0.1 - 0.9 x10E3/uL  ? EOS (ABSOLUTE) 0.1 0.0 - 0.4 x10E3/uL  ? Basophils Absolute 0.1 0.0 - 0.2 x10E3/uL  ? Immature Granulocytes 1 Not Estab. %  ? Immature Grans (Abs) 0.1 0.0 - 0.1 x10E3/uL  ?Comprehensive metabolic panel  ?Result Value Ref Range  ? Glucose 200 (H) 70 - 99 mg/dL  ? BUN 13 8 - 27 mg/dL  ? Creatinine, Ser 1.04 0.76 - 1.27 mg/dL  ? eGFR 72 >59 mL/min/1.73  ? BUN/Creatinine Ratio 13 10 - 24  ? Sodium 141 134 - 144 mmol/L  ? Potassium 4.0 3.5 - 5.2 mmol/L  ? Chloride 101 96 - 106 mmol/L  ? CO2 25 20 - 29 mmol/L  ? Calcium 10.4 (H) 8.6 - 10.2 mg/dL  ? Total Protein 7.1 6.0 - 8.5 g/dL  ? Albumin 4.1 3.6 - 4.6 g/dL  ? Globulin, Total 3.0 1.5 - 4.5 g/dL  ? Albumin/Globulin Ratio 1.4 1.2 - 2.2  ? Bilirubin Total 0.3 0.0 - 1.2 mg/dL  ? Alkaline Phosphatase 62 44 - 121 IU/L  ? AST 11 0 - 40 IU/L  ? ALT 16 0 - 44 IU/L  ?Urinalysis, Routine w reflex microscopic  ?Result Value Ref Range  ? Specific Gravity, UA 1.025 1.005 - 1.030  ? pH, UA 6.5 5.0 - 7.5  ? Color, UA Yellow Yellow  ? Appearance Ur Clear Clear  ? Leukocytes,UA Negative Negative  ?  Protein,UA 2+ (A) Negative/Trace  ? Glucose, UA Trace (A) Negative  ? Ketones, UA Negative Negative  ? RBC, UA Negative Negative  ? Bilirubin, UA Negative Negative  ? Urobilinogen, Ur 0.2 0.2 - 1.0 mg/dL  ? Nitrite, UA Negative Negative  ? Microscopic Examination See below:   ? ?   ? ? ?Current Outpatient Medications:  ?  acyclovir (ZOVIRAX) 400 MG tablet, Take 400 mg by mouth 2 (two) times  daily., Disp: , Rfl:  ?  aspirin EC 81 MG EC tablet, Take 1 tablet (81 mg total) by mouth daily. Swallow whole., Disp: 30 tablet, Rfl: 0 ?  carvedilol (COREG) 25 MG tablet, TAKE ONE TABLET BY MOUTH TWO TIMES A DAY FOR BLOOD PRESSURE, Disp: , Rfl:  ?  cloNIDine (CATAPRES) 0.1 MG tablet, Take 0.1 mg by mouth 2 (two) times daily., Disp: , Rfl:  ?  Fluticasone-Salmeterol (ADVAIR) 100-50 MCG/DOSE AEPB, Inhale 1 puff into the lungs 2 (two) times daily., Disp: 1 each, Rfl: 3 ?  gabapentin (NEURONTIN) 100 MG capsule, Take 1 capsule (100 mg total) by mouth 2 (two) times daily., Disp: 60 capsule, Rfl: 0 ?  hydrALAZINE (APRESOLINE) 50 MG tablet, TAKE ONE TABLET BY MOUTH THREE TIMES A DAY FOR BLOOD PRESSURE, Disp: , Rfl:  ?  losartan (COZAAR) 100 MG tablet, Take 100 mg by mouth daily., Disp: , Rfl:  ?  magnesium oxide (MAG-OX) 400 (240 Mg) MG tablet, Take 1 tablet (400 mg total) by mouth daily., Disp: 90 tablet, Rfl: 0 ?  metFORMIN (GLUCOPHAGE) 1000 MG tablet, Take 1 tablet (1,000 mg total) by mouth 2 (two) times daily with a meal., Disp: 180 tablet, Rfl: 3 ?  rosuvastatin (CRESTOR) 40 MG tablet, Take 1 tablet (40 mg total) by mouth at bedtime., Disp: 90 tablet, Rfl: 3 ?  tamsulosin (FLOMAX) 0.4 MG CAPS capsule, Take 1 capsule (0.4 mg total) by mouth daily. Patient restarted on his own due to trouble urinating, Disp: 30 capsule, Rfl: 5 ?  Tiotropium Bromide Monohydrate (SPIRIVA RESPIMAT) 2.5 MCG/ACT AERS, Inhale 2 puffs into the lungs daily., Disp: 1 g, Rfl: 3 ?  Torsemide 40 MG TABS, Take 80 mg by mouth 2 (two) times daily., Disp: , Rfl:  ?  vitamin B-12 (CYANOCOBALAMIN) 500 MCG tablet, Take 2,000 mcg by mouth at bedtime., Disp: , Rfl:  ?  Vitamin D, Cholecalciferol, 25 MCG (1000 UT) CAPS, Take 2,000 mcg by mouth at bedtime., Disp: , Rfl:   ? ? ?Assessment & Plan:  ?Uti  ?Improving check labs ?check UA.  pt is currently symptomatic for an Urinary tract infection(abd pain, burning etc), will cover with emperic abx, see med  module for details.  encouraged to increase water/fluid intake.Signs and symptoms of emergency were discussed with the patient. The risks, benefits and side effects of treatment were discussed with the patient. The patient

## 2021-07-09 DIAGNOSIS — R0609 Other forms of dyspnea: Secondary | ICD-10-CM | POA: Diagnosis not present

## 2021-07-09 DIAGNOSIS — G4733 Obstructive sleep apnea (adult) (pediatric): Secondary | ICD-10-CM | POA: Diagnosis not present

## 2021-07-09 DIAGNOSIS — J439 Emphysema, unspecified: Secondary | ICD-10-CM | POA: Diagnosis not present

## 2021-07-09 LAB — CBC WITH DIFFERENTIAL/PLATELET
Basophils Absolute: 0.1 10*3/uL (ref 0.0–0.2)
Basos: 1 %
EOS (ABSOLUTE): 0.1 10*3/uL (ref 0.0–0.4)
Eos: 2 %
Hematocrit: 39.4 % (ref 37.5–51.0)
Hemoglobin: 13.1 g/dL (ref 13.0–17.7)
Immature Grans (Abs): 0.1 10*3/uL (ref 0.0–0.1)
Immature Granulocytes: 1 %
Lymphocytes Absolute: 1.7 10*3/uL (ref 0.7–3.1)
Lymphs: 26 %
MCH: 29.7 pg (ref 26.6–33.0)
MCHC: 33.2 g/dL (ref 31.5–35.7)
MCV: 89 fL (ref 79–97)
Monocytes Absolute: 0.4 10*3/uL (ref 0.1–0.9)
Monocytes: 7 %
Neutrophils Absolute: 4.1 10*3/uL (ref 1.4–7.0)
Neutrophils: 63 %
Platelets: 236 10*3/uL (ref 150–450)
RBC: 4.41 x10E6/uL (ref 4.14–5.80)
RDW: 13.1 % (ref 11.6–15.4)
WBC: 6.4 10*3/uL (ref 3.4–10.8)

## 2021-07-09 LAB — COMPREHENSIVE METABOLIC PANEL
ALT: 16 IU/L (ref 0–44)
AST: 11 IU/L (ref 0–40)
Albumin/Globulin Ratio: 1.4 (ref 1.2–2.2)
Albumin: 4.1 g/dL (ref 3.6–4.6)
Alkaline Phosphatase: 62 IU/L (ref 44–121)
BUN/Creatinine Ratio: 13 (ref 10–24)
BUN: 13 mg/dL (ref 8–27)
Bilirubin Total: 0.3 mg/dL (ref 0.0–1.2)
CO2: 25 mmol/L (ref 20–29)
Calcium: 10.4 mg/dL — ABNORMAL HIGH (ref 8.6–10.2)
Chloride: 101 mmol/L (ref 96–106)
Creatinine, Ser: 1.04 mg/dL (ref 0.76–1.27)
Globulin, Total: 3 g/dL (ref 1.5–4.5)
Glucose: 200 mg/dL — ABNORMAL HIGH (ref 70–99)
Potassium: 4 mmol/L (ref 3.5–5.2)
Sodium: 141 mmol/L (ref 134–144)
Total Protein: 7.1 g/dL (ref 6.0–8.5)
eGFR: 72 mL/min/{1.73_m2} (ref >59)

## 2021-07-22 ENCOUNTER — Ambulatory Visit: Payer: PRIVATE HEALTH INSURANCE | Admitting: Internal Medicine

## 2021-08-12 ENCOUNTER — Other Ambulatory Visit: Payer: PRIVATE HEALTH INSURANCE

## 2021-08-19 ENCOUNTER — Ambulatory Visit: Payer: PRIVATE HEALTH INSURANCE | Admitting: Internal Medicine

## 2021-09-01 ENCOUNTER — Ambulatory Visit (INDEPENDENT_AMBULATORY_CARE_PROVIDER_SITE_OTHER): Payer: HMO | Admitting: *Deleted

## 2021-09-01 DIAGNOSIS — R519 Headache, unspecified: Secondary | ICD-10-CM | POA: Diagnosis not present

## 2021-09-01 DIAGNOSIS — R569 Unspecified convulsions: Secondary | ICD-10-CM | POA: Diagnosis not present

## 2021-09-01 DIAGNOSIS — R29898 Other symptoms and signs involving the musculoskeletal system: Secondary | ICD-10-CM | POA: Diagnosis not present

## 2021-09-01 DIAGNOSIS — R262 Difficulty in walking, not elsewhere classified: Secondary | ICD-10-CM | POA: Diagnosis not present

## 2021-09-01 DIAGNOSIS — Z0000 Encounter for general adult medical examination without abnormal findings: Secondary | ICD-10-CM | POA: Diagnosis not present

## 2021-09-01 DIAGNOSIS — Z9989 Dependence on other enabling machines and devices: Secondary | ICD-10-CM | POA: Diagnosis not present

## 2021-09-01 DIAGNOSIS — Z8673 Personal history of transient ischemic attack (TIA), and cerebral infarction without residual deficits: Secondary | ICD-10-CM | POA: Diagnosis not present

## 2021-09-01 DIAGNOSIS — R296 Repeated falls: Secondary | ICD-10-CM | POA: Diagnosis not present

## 2021-09-01 DIAGNOSIS — G250 Essential tremor: Secondary | ICD-10-CM | POA: Diagnosis not present

## 2021-09-01 DIAGNOSIS — G4733 Obstructive sleep apnea (adult) (pediatric): Secondary | ICD-10-CM | POA: Diagnosis not present

## 2021-09-01 NOTE — Progress Notes (Signed)
Subjective:   Justin Griffith is a 81 y.o. male who presents for Medicare Annual/Subsequent preventive examination.  I connected with  MARIN MILLEY on 09/01/21 by a telephone enabled telemedicine application and verified that I am speaking with the correct person using two identifiers.   I discussed the limitations of evaluation and management by telemedicine. The patient expressed understanding and agreed to proceed.  Patient location: home  Provider location: Tele-Health-home   Review of Systems     Cardiac Risk Factors include: advanced age (>22mn, >>53women);diabetes mellitus;hypertension;male gender;family history of premature cardiovascular disease;obesity (BMI >30kg/m2);sedentary lifestyle     Objective:    There were no vitals filed for this visit. There is no height or weight on file to calculate BMI.     09/01/2021   11:45 AM 09/01/2021   11:38 AM 06/25/2021   11:50 PM 06/25/2021    6:44 PM 08/30/2020    1:04 PM 05/17/2020    7:18 AM 05/16/2020    4:40 PM  Advanced Directives  Does Patient Have a Medical Advance Directive? No No  No No  No  Would patient like information on creating a medical advance directive? No - Patient declined No - Patient declined No - Patient declined   No - Patient declined No - Patient declined    Current Medications (verified) Outpatient Encounter Medications as of 09/01/2021  Medication Sig   acyclovir (ZOVIRAX) 400 MG tablet Take 400 mg by mouth 2 (two) times daily.   aspirin EC 81 MG EC tablet Take 1 tablet (81 mg total) by mouth daily. Swallow whole.   carvedilol (COREG) 25 MG tablet TAKE ONE TABLET BY MOUTH TWO TIMES A DAY FOR BLOOD PRESSURE   cloNIDine (CATAPRES) 0.1 MG tablet Take 0.1 mg by mouth 2 (two) times daily.   gabapentin (NEURONTIN) 100 MG capsule Take 1 capsule (100 mg total) by mouth 2 (two) times daily.   hydrALAZINE (APRESOLINE) 50 MG tablet TAKE ONE TABLET BY MOUTH THREE TIMES A DAY FOR BLOOD PRESSURE   losartan  (COZAAR) 100 MG tablet Take 100 mg by mouth daily.   metFORMIN (GLUCOPHAGE) 1000 MG tablet Take 1 tablet (1,000 mg total) by mouth 2 (two) times daily with a meal.   rosuvastatin (CRESTOR) 40 MG tablet Take 1 tablet (40 mg total) by mouth at bedtime.   tamsulosin (FLOMAX) 0.4 MG CAPS capsule Take 1 capsule (0.4 mg total) by mouth daily. Patient restarted on his own due to trouble urinating   Torsemide 40 MG TABS Take 80 mg by mouth 2 (two) times daily.   vitamin B-12 (CYANOCOBALAMIN) 500 MCG tablet Take 2,000 mcg by mouth at bedtime.   Vitamin D, Cholecalciferol, 25 MCG (1000 UT) CAPS Take 2,000 mcg by mouth at bedtime.   Fluticasone-Salmeterol (ADVAIR) 100-50 MCG/DOSE AEPB Inhale 1 puff into the lungs 2 (two) times daily. (Patient not taking: Reported on 09/01/2021)   magnesium oxide (MAG-OX) 400 (240 Mg) MG tablet Take 1 tablet (400 mg total) by mouth daily.   Tiotropium Bromide Monohydrate (SPIRIVA RESPIMAT) 2.5 MCG/ACT AERS Inhale 2 puffs into the lungs daily. (Patient not taking: Reported on 09/01/2021)   No facility-administered encounter medications on file as of 09/01/2021.    Allergies (verified) Farxiga [dapagliflozin], Dulaglutide, Liraglutide, and Jardiance [empagliflozin]   History: Past Medical History:  Diagnosis Date   Arthritis    Asthma 2000   Back pain    Colon polyp 2012   COPD (chronic obstructive pulmonary disease) ([place]  Emphysema of lung (HCC)    Heart murmur    Hernia 2000   Hyperlipidemia    Hypertension    Personal history of malignant neoplasm of large intestine    Personal history of tobacco use, presenting hazards to health    Sleep apnea    Special screening for malignant neoplasms, colon    Type 2 diabetes mellitus with proteinuria ([place]) 10/11/2014   Past Surgical History:  Procedure Laterality Date   CARDIAC CATHETERIZATION Left 09/25/2015   Procedure: Left Heart Cath and Coronary Angiography;  Surgeon: Yolonda Kida, MD;  Location: [facility];  Service: Cardiovascular;  Laterality: Left;   CHOLECYSTECTOMY  1970   COLON SURGERY  07-16-99   sigmoid colon resection with primary anastomosis, chemotherapy for metastatic disease   COLONOSCOPY  2001, 2012   Dr Bary Castilla, tubular adenoma of the cecum and ascending colon in 2012.   COLONOSCOPY WITH PROPOFOL N/A 02/12/2016   Procedure: COLONOSCOPY WITH PROPOFOL;  Surgeon: Robert Bellow, MD;  Location[facility];  Service: Endoscopy;  Laterality: N/A;   CORONARY STENT INTERVENTION N/A 05/17/2020   Procedure: CORONARY STENT INTERVENTION;  Surgeon: Yolonda Kida, MD;  Location: [facility];  Service: Cardiovascular;  Laterality: N/A;  [facility]AD   ESOPHAGOGASTRODUODENOSCOPY (EGD) WITH PROPOFOL N/A 02/12/2016   Procedure: ESOPHAGOGASTRODUODENOSCOPY (EGD) WITH PROPOFOL;  Surgeon: Robert Bellow, MD;  Location: [facility] ENDOSCOPY;  Service: Endoscopy;  Laterality: N/A;   HERNIA REPAIR Right    right inguinal hernia repair   JOINT REPLACEMENT Bilateral    knee replacement   KNEE SURGERY Bilateral 2010   LEFT HEART CATH AND CORONARY ANGIOGRAPHY N/A 05/17/2020   Procedure: LEFT HEART CATH AND CORONARY ANGIOGRAPHY possible PCI and stent;  Surgeon: Yolonda Kida, MD;  Location: [facility];  Service: Cardiovascular;  Laterality: N/A;   MYRINGOTOMY WITH TUBE PLACEMENT Bilateral 08/16/2014   Procedure: MYRINGOTOMY WITH TUBE PLACEMENT;  Surgeon: Carloyn Manner, MD;  Location: [facility];  Service: ENT;  Laterality: Bilateral;   PERIPHERAL VASCULAR CATHETERIZATION Right 09/04/2015   Procedure: Lower Extremity Angiography;  Surgeon: Katha Cabal, MD;  Location: [place] [facility];  Service: Cardiovascular;  Laterality: Right;   PERIPHERAL VASCULAR CATHETERIZATION  09/04/2015   Procedure: Lower Extremity Intervention;  Surgeon: Katha Cabal, MD;  Location: [place] [facility];  Service: Cardiovascular;;   TONSILLECTOMY     TYMPANOSTOMY TUBE PLACEMENT      Family History  Problem Relation Age of Onset   Diabetes Father    Esophageal cancer Mother    Alzheimer's disease Paternal Uncle    Social History   Socioeconomic History   Marital status: Married    Spouse name: Not on file   Number of children: Not on file   Years of education: Not on file   Highest education level: GED or equivalent  Occupational History   Occupation: retired    Comment: [employer]  Tobacco Use   Smoking status: Former    Packs/day: 1.00    Years: 30.00    Total pack years: 30.00    Types: Cigarettes    Quit date: 03/23/1989    Years since quitting: 32.4   Smokeless tobacco: Never  Vaping Use   Vaping Use: Never used  Substance and Sexual Activity   Alcohol use: No    Alcohol/week: 0.0 standard drinks of alcohol   Drug use: No   Sexual activity: Not on file  Other Topics Concern   Not on  file  Social History Narrative   [employer]    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    Social Determinants of Health   Financial Resource Strain: Low Risk  (09/01/2021)   Overall Financial Resource Strain (CARDIA)    Difficulty of Paying Living Expenses: Not hard at all  Food Insecurity: No Food Insecurity (09/01/2021)   Hunger Vital Sign    Worried About Running Out of Food in the Last Year: Never true    Ran Out of Food in the Last Year: Never true  Transportation Needs: No Transportation Needs (09/01/2021)   PRAPARE - Hydrologist (Medical): No    Lack of Transportation (Non-Medical): No  Physical Activity: Inactive (09/01/2021)   Exercise Vital Sign    Days of Exercise per Week: 0 days    Minutes of Exercise per Session: 0 min  Stress: Stress Concern Present (09/01/2021)   [place]    Feeling of Stress : To some extent  Social Connections: Socially Isolated (09/01/2021)   Social Connection and Isolation Panel [NHANES]    Frequency of Communication with Friends and Family: Never     Frequency of Social Gatherings with Friends and Family: Once a week    Attends Religious Services: More than 4 times per year    Active Member of [employer] or Organizations: No    Attends Music therapist: Never    Marital Status: Divorced    Tobacco Counseling Counseling given: Not Answered   Clinical Intake:  Pre-visit preparation completed: Yes  Pain : No/denies pain     Nutritional Risks: None Diabetes: Yes CBG done?: No Did pt. bring in CBG monitor from home?: No  How often do you need to have someone help you when you read instructions, pamphlets, or other written materials from your doctor or pharmacy?: 1 - Never  Diabetic?yes  Nutrition Risk Assessment:  Has the patient had any N/V/D within the last 2 months?  No  Does the patient have any non-healing wounds?  No  Has the patient had any unintentional weight loss or weight gain?  No   Diabetes:  Is the patient diabetic?  Yes  If diabetic, was a CBG obtained today?  No  Did the patient bring in their glucometer from home?  No  How often do you monitor your CBG's? 2 x a week.   Financial Strains and Diabetes Management:  Are you having any financial strains with the device, your supplies or your medication? No .  Does the patient want to be seen by Chronic Care Management for management of their diabetes?  No  Would the patient like to be referred to a Nutritionist or for Diabetic Management?  No   Diabetic Exams:  Diabetic Eye Exam: . Pt has been advised about the importance in completing this exam.  Diabetic Foot Exam: . Pt has been advised about the importance in completing this exam.   Interpreter Needed?: No  Information entered by :: Leroy Kennedy LPN   Activities of Daily Living    09/01/2021   11:40 AM 06/25/2021   11:50 PM  In your present state of health, do you have any difficulty performing the following activities:  Hearing? 1 1  Vision? 0 0  Difficulty concentrating or making  decisions? 0 0  Walking or climbing stairs? 1 1  Dressing or bathing? 0 1  Doing errands, shopping? 1 0  Preparing Food and eating ? N  Using the Toilet? N   In the past six months, have you accidently leaked urine? N   Do you have problems with loss of bowel control? N   Managing your Medications? N   Managing your Finances? N   Housekeeping or managing your Housekeeping? Y     Patient Care Team: Charlynne Cousins, MD as PCP - General (Internal Medicine) Guadalupe Maple, MD (Family Medicine) Bary Castilla, Forest Gleason, MD (General Surgery) Carloyn Manner, MD as Referring Physician (Otolaryngology) Delana Meyer, Dolores Lory, MD (Vascular Surgery) Abisogun, Domenica Reamer, MD as Consulting Physician (Internal Medicine) Vanita Ingles, RN as Registered Nurse (General Practice)  Indicate any recent Medical Services you may have received from other than [facility] providers in the past year (date may be approximate).     Assessment:   This is a routine wellness examination for Lonnie.  Hearing/Vision screen Hearing Screening - Comments:: Bilateral hearing   Vision Screening - Comments:: Ellin Mayhew Up to date  Dietary issues and exercise activities discussed: Current Exercise Habits: The patient does not participate in regular exercise at present   Goals Addressed             This Visit's Progress    Patient Stated       No goals       Depression Screen    09/01/2021   11:39 AM 07/08/2021    1:28 PM 10/16/2020   10:47 AM 09/02/2020    8:57 AM 08/30/2020    1:05 PM 07/18/2020    9:53 AM 05/15/2020    1:49 PM  PHQ 2/9 Scores  PHQ - 2 Score 2 0 0 0 0 0 0  PHQ- 9 Score 2 0         Fall Risk    09/01/2021   11:36 AM 07/08/2021    1:27 PM 10/16/2020   10:46 AM 09/02/2020    8:56 AM 08/30/2020    1:05 PM  Fall Risk   Falls in the past year? 1 1 0 1 1  Comment     loses balance  Number falls in past yr: 1 1 0 1 1  Injury with Fall? 1 0 0 0 0  Risk for fall due to :  No Fall Risks No Fall  Risks Impaired balance/gait Impaired balance/gait;Impaired mobility;Medication side effect  Follow up Falls evaluation completed;Education provided;Falls prevention discussed Falls evaluation completed Falls evaluation completed Falls evaluation completed;Education provided;Falls prevention discussed Falls evaluation completed;Education provided;Falls prevention discussed    FALL RISK PREVENTION PERTAINING TO THE HOME:  Any stairs in or around the home? Yes  If so, are there any without handrails? No  Home free of loose throw rugs in walkways, pet beds, electrical cords, etc? Yes  Adequate lighting in your home to reduce risk of falls? Yes   ASSISTIVE DEVICES UTILIZED TO PREVENT FALLS:  Life alert? No  Use of a cane, walker or w/c? Yes  Grab bars in the bathroom? Yes  Shower chair or bench in shower? No  Elevated toilet seat or a handicapped toilet? No   TIMED UP AND GO:  Was the test performed? No .    Cognitive Function:        09/01/2021   11:37 AM 05/08/2020    7:09 PM 01/01/2020   11:14 AM 08/22/2018    2:11 PM 08/20/2017   10:32 AM  6CIT Screen  What Year? 0 points 0 points 4 points 0 points 0 points  What month? 0 points 0 points 0 points  0 points 0 points  What time? 0 points 0 points 0 points 0 points 0 points  Count back from 20 0 points 2 points 0 points 0 points 0 points  Months in reverse 4 points 2 points 0 points 0 points 0 points  Repeat phrase 6 points 8 points 8 points 0 points 0 points  Total Score 10 points 12 points 12 points 0 points 0 points    Immunizations Immunization History  Administered Date(s) Administered   Fluad Quad(high Dose 65+) 12/14/2018, 11/28/2019   Influenza, High Dose Seasonal PF 02/02/2017   Influenza,inj,Quad PF,6+ Mos 01/16/2015   Influenza-Unspecified 01/20/2016, 01/01/2017, 02/15/2018   PFIZER(Purple Top)SARS-COV-2 Vaccination 04/01/2019, 04/25/2019   Pneumococcal Conjugate-13 07/05/2014   Pneumococcal Polysaccharide-23  01/30/2016   Pneumococcal-Unspecified 03/23/2000, 12/09/2004   Td 01/19/2008   Tdap 02/20/2018, 05/19/2018   Zoster Recombinat (Shingrix) 08/26/2017, 11/11/2017   Zoster, Live 07/28/2011    TDAP status: Up to date  Flu Vaccine status: Due, Education has been provided regarding the importance of this vaccine. Advised may receive this vaccine at local pharmacy or Health Dept. Aware to provide a copy of the vaccination record if obtained from local pharmacy or Health Dept. Verbalized acceptance and understanding.  Pneumococcal vaccine status: Up to date  Covid-19 vaccine status: Information provided on how to obtain vaccines.   Qualifies for Shingles Vaccine? No   Zostavax completed Yes   Shingrix Completed?: Yes  Screening Tests Health Maintenance  Topic Date Due   FOOT EXAM  03/27/2020   OPHTHALMOLOGY EXAM  08/20/2020   COVID-19 Vaccine (3 - Pfizer risk series) 09/17/2021 (Originally 05/23/2019)   INFLUENZA VACCINE  10/21/2021   HEMOGLOBIN A1C  12/26/2021   TETANUS/TDAP  05/19/2028   Pneumonia Vaccine 5+ Years old  Completed   Zoster Vaccines- Shingrix  Completed   HPV VACCINES  Aged Out    Health Maintenance  Health Maintenance Due  Topic Date Due   FOOT EXAM  03/27/2020   OPHTHALMOLOGY EXAM  08/20/2020    Colorectal cancer screening: No longer required.   Lung Cancer Screening: (Low Dose CT Chest recommended if Age 81-80 years, 30 pack-year currently smoking OR have quit w/in 15years.) does not qualify.   Lung Cancer Screening Referral:   Additional Screening:  Hepatitis C Screening: does not qualify  Vision Screening: Recommended annual ophthalmology exams for early detection of glaucoma and other disorders of the eye. Is the patient up to date with their annual eye exam?  Yes  Who is the provider or what is the name of the office in which the patient attends annual eye exams? [facility] If pt is not established with a provider, would they like to be referred to a  provider to establish care? No .   Dental Screening: Recommended annual dental exams for proper oral hygiene  Community Resource Referral / Chronic Care Management: CRR required this visit?  No   CCM required this visit?  No      Plan:     I have personally reviewed and noted the following in the patient's chart:   Medical and social history Use of alcohol, tobacco or illicit drugs  Current medications and supplements including opioid prescriptions. Patient is not currently taking opioid prescriptions. Functional ability and status Nutritional status Physical activity Advanced directives List of other physicians Hospitalizations, surgeries, and ER visits in previous 12 months Vitals Screenings to include cognitive, depression, and falls Referrals and appointments  In addition, I have reviewed and discussed with patient certain preventive  protocols, quality metrics, and best practice recommendations. A written personalized care plan for preventive services as well as general preventive health recommendations were provided to patient.     Leroy Kennedy, LPN   4/36/0165   Nurse Notes:

## 2021-09-01 NOTE — Patient Instructions (Signed)
Justin Griffith , Thank you for taking time to come for your Medicare Wellness Visit. I appreciate your ongoing commitment to your health goals. Please review the following plan we discussed and let me know if I can assist you in the future.   Screening recommendations/referrals: Colonoscopy: no longer required Recommended yearly ophthalmology/optometry visit for glaucoma screening and checkup Recommended yearly dental visit for hygiene and checkup  Vaccinations: Influenza vaccine: Education provided Pneumococcal vaccine: up to date Tdap vaccine: up to date Shingles vaccine: up to date    Advanced directives: Education provided  Conditions/risks identified:   Next appointment: 09-05-2021 11:00  [facility]   09-11-2021 @ 11;00 [place]  Preventive Care 82 Years and Older, Male Preventive care refers to lifestyle choices and visits with your health care provider that can promote health and wellness. What does preventive care include? A yearly physical exam. This is also called an annual well check. Dental exams once or twice a year. Routine eye exams. Ask your health care provider how often you should have your eyes checked. Personal lifestyle choices, including: Daily care of your teeth and gums. Regular physical activity. Eating a healthy diet. Avoiding tobacco and drug use. Limiting alcohol use. Practicing safe sex. Taking low doses of aspirin every day. Taking vitamin and mineral supplements as recommended by your health care provider. What happens during an annual well check? The services and screenings done by your health care provider during your annual well check will depend on your age, overall health, lifestyle risk factors, and family history of disease. Counseling  Your health care provider may ask you questions about your: Alcohol use. Tobacco use. Drug use. Emotional well-being. Home and relationship well-being. Sexual activity. Eating habits. History of falls. Memory and  ability to understand (cognition). Work and work Statistician. Screening  You may have the following tests or measurements: Height, weight, and BMI. Blood pressure. Lipid and cholesterol levels. These may be checked every 5 years, or more frequently if you are over 19 years old. Skin check. Lung cancer screening. You may have this screening every year starting at age 26 if you have a 30-pack-year history of smoking and currently smoke or have quit within the past 15 years. Fecal occult blood test (FOBT) of the stool. You may have this test every year starting at age 86. Flexible sigmoidoscopy or colonoscopy. You may have a sigmoidoscopy every 5 years or a colonoscopy every 10 years starting at age 10. Prostate cancer screening. Recommendations will vary depending on your family history and other risks. Hepatitis C blood test. Hepatitis B blood test. Sexually transmitted disease (STD) testing. Diabetes screening. This is done by checking your blood sugar (glucose) after you have not eaten for a while (fasting). You may have this done every 1-3 years. Abdominal aortic aneurysm (AAA) screening. You may need this if you are a current or former smoker. Osteoporosis. You may be screened starting at age 34 if you are at high risk. Talk with your health care provider about your test results, treatment options, and if necessary, the need for more tests. Vaccines  Your health care provider may recommend certain vaccines, such as: Influenza vaccine. This is recommended every year. Tetanus, diphtheria, and acellular pertussis (Tdap, Td) vaccine. You may need a Td booster every 10 years. Zoster vaccine. You may need this after age 60. Pneumococcal 13-valent conjugate (PCV13) vaccine. One dose is recommended after age 55. Pneumococcal polysaccharide (PPSV23) vaccine. One dose is recommended after age 32. Talk to your health care  provider about which screenings and vaccines you need and how often you need  them. This information is not intended to replace advice given to you by your health care provider. Make sure you discuss any questions you have with your health care provider. Document Released: 04/05/2015 Document Revised: 11/27/2015 Document Reviewed: 01/08/2015 Elsevier Interactive Patient Education  2017 [employer] Prevention in the Home Falls can cause injuries. They can happen to people of all ages. There are many things you can do to make your home safe and to help prevent falls. What can I do on the outside of my home? Regularly fix the edges of walkways and driveways and fix any cracks. Remove anything that might make you trip as you walk through a door, such as a raised step or threshold. Trim any bushes or trees on the path to your home. Use bright outdoor lighting. Clear any walking paths of anything that might make someone trip, such as rocks or tools. Regularly check to see if handrails are loose or broken. Make sure that both sides of any steps have handrails. Any raised decks and porches should have guardrails on the edges. Have any leaves, snow, or ice cleared regularly. Use sand or salt on walking paths during winter. Clean up any spills in your garage right away. This includes oil or grease spills. What can I do in the bathroom? Use night lights. Install grab bars by the toilet and in the tub and shower. Do not use towel bars as grab bars. Use non-skid mats or decals in the tub or shower. If you need to sit down in the shower, use a plastic, non-slip stool. Keep the floor dry. Clean up any water that spills on the floor as soon as it happens. Remove soap buildup in the tub or shower regularly. Attach bath mats securely with double-sided non-slip rug tape. Do not have throw rugs and other things on the floor that can make you trip. What can I do in the bedroom? Use night lights. Make sure that you have a light by your bed that is easy to reach. Do not use  any sheets or blankets that are too big for your bed. They should not hang down onto the floor. Have a firm chair that has side arms. You can use this for support while you get dressed. Do not have throw rugs and other things on the floor that can make you trip. What can I do in the kitchen? Clean up any spills right away. Avoid walking on wet floors. Keep items that you use a lot in easy-to-reach places. If you need to reach something above you, use a strong step stool that has a grab bar. Keep electrical cords out of the way. Do not use floor polish or wax that makes floors slippery. If you must use wax, use non-skid floor wax. Do not have throw rugs and other things on the floor that can make you trip. What can I do with my stairs? Do not leave any items on the stairs. Make sure that there are handrails on both sides of the stairs and use them. Fix handrails that are broken or loose. Make sure that handrails are as long as the stairways. Check any carpeting to make sure that it is firmly attached to the stairs. Fix any carpet that is loose or worn. Avoid having throw rugs at the top or bottom of the stairs. If you do have throw rugs, attach them to the  floor with carpet tape. Make sure that you have a light switch at the top of the stairs and the bottom of the stairs. If you do not have them, ask someone to add them for you. What else can I do to help prevent falls? Wear shoes that: Do not have high heels. Have rubber bottoms. Are comfortable and fit you well. Are closed at the toe. Do not wear sandals. If you use a stepladder: Make sure that it is fully opened. Do not climb a closed stepladder. Make sure that both sides of the stepladder are locked into place. Ask someone to hold it for you, if possible. Clearly mark and make sure that you can see: Any grab bars or handrails. First and last steps. Where the edge of each step is. Use tools that help you move around (mobility aids)  if they are needed. These include: Canes. Walkers. Scooters. Crutches. Turn on the lights when you go into a dark area. Replace any light bulbs as soon as they burn out. Set up your furniture so you have a clear path. Avoid moving your furniture around. If any of your floors are uneven, fix them. If there are any pets around you, be aware of where they are. Review your medicines with your doctor. Some medicines can make you feel dizzy. This can increase your chance of falling. Ask your doctor what other things that you can do to help prevent falls. This information is not intended to replace advice given to you by your health care provider. Make sure you discuss any questions you have with your health care provider. Document Released: 01/03/2009 Document Revised: 08/15/2015 Document Reviewed: 04/13/2014 Elsevier Interactive Patient Education  2017 [employer].

## 2021-09-03 DIAGNOSIS — I872 Venous insufficiency (chronic) (peripheral): Secondary | ICD-10-CM | POA: Diagnosis not present

## 2021-09-03 DIAGNOSIS — R600 Localized edema: Secondary | ICD-10-CM | POA: Diagnosis not present

## 2021-09-03 DIAGNOSIS — I2510 Atherosclerotic heart disease of native coronary artery without angina pectoris: Secondary | ICD-10-CM | POA: Diagnosis not present

## 2021-09-03 DIAGNOSIS — I214 Non-ST elevation (NSTEMI) myocardial infarction: Secondary | ICD-10-CM | POA: Diagnosis not present

## 2021-09-03 DIAGNOSIS — J449 Chronic obstructive pulmonary disease, unspecified: Secondary | ICD-10-CM | POA: Diagnosis not present

## 2021-09-03 DIAGNOSIS — G4733 Obstructive sleep apnea (adult) (pediatric): Secondary | ICD-10-CM | POA: Diagnosis not present

## 2021-09-03 DIAGNOSIS — E1159 Type 2 diabetes mellitus with other circulatory complications: Secondary | ICD-10-CM | POA: Diagnosis not present

## 2021-09-03 DIAGNOSIS — I700 Atherosclerosis of aorta: Secondary | ICD-10-CM | POA: Diagnosis not present

## 2021-09-03 DIAGNOSIS — Z955 Presence of coronary angioplasty implant and graft: Secondary | ICD-10-CM | POA: Diagnosis not present

## 2021-09-03 DIAGNOSIS — E1169 Type 2 diabetes mellitus with other specified complication: Secondary | ICD-10-CM | POA: Diagnosis not present

## 2021-09-03 DIAGNOSIS — I824Y1 Acute embolism and thrombosis of unspecified deep veins of right proximal lower extremity: Secondary | ICD-10-CM | POA: Diagnosis not present

## 2021-09-05 ENCOUNTER — Other Ambulatory Visit: Payer: PRIVATE HEALTH INSURANCE

## 2021-09-05 ENCOUNTER — Ambulatory Visit: Payer: PRIVATE HEALTH INSURANCE | Admitting: Nurse Practitioner

## 2021-09-05 DIAGNOSIS — E1159 Type 2 diabetes mellitus with other circulatory complications: Secondary | ICD-10-CM | POA: Diagnosis not present

## 2021-09-05 DIAGNOSIS — R809 Proteinuria, unspecified: Secondary | ICD-10-CM

## 2021-09-05 DIAGNOSIS — E039 Hypothyroidism, unspecified: Secondary | ICD-10-CM

## 2021-09-05 DIAGNOSIS — E785 Hyperlipidemia, unspecified: Secondary | ICD-10-CM | POA: Diagnosis not present

## 2021-09-05 DIAGNOSIS — E1169 Type 2 diabetes mellitus with other specified complication: Secondary | ICD-10-CM

## 2021-09-05 DIAGNOSIS — E1129 Type 2 diabetes mellitus with other diabetic kidney complication: Secondary | ICD-10-CM | POA: Diagnosis not present

## 2021-09-05 DIAGNOSIS — I152 Hypertension secondary to endocrine disorders: Secondary | ICD-10-CM | POA: Diagnosis not present

## 2021-09-05 LAB — BAYER DCA HB A1C WAIVED: HB A1C (BAYER DCA - WAIVED): 8.7 % — ABNORMAL HIGH (ref 4.8–5.6)

## 2021-09-06 LAB — BASIC METABOLIC PANEL
BUN/Creatinine Ratio: 15 (ref 10–24)
BUN: 18 mg/dL (ref 8–27)
CO2: 19 mmol/L — ABNORMAL LOW (ref 20–29)
Calcium: 10.4 mg/dL — ABNORMAL HIGH (ref 8.6–10.2)
Chloride: 99 mmol/L (ref 96–106)
Creatinine, Ser: 1.18 mg/dL (ref 0.76–1.27)
Glucose: 229 mg/dL — ABNORMAL HIGH (ref 70–99)
Potassium: 4.9 mmol/L (ref 3.5–5.2)
Sodium: 139 mmol/L (ref 134–144)
eGFR: 62 mL/min/{1.73_m2} (ref >59)

## 2021-09-06 LAB — LIPID PANEL
Chol/HDL Ratio: 8.5 ratio — ABNORMAL HIGH (ref 0.0–5.0)
Cholesterol, Total: 255 mg/dL — ABNORMAL HIGH (ref 100–199)
HDL: 30 mg/dL — ABNORMAL LOW (ref >39)
LDL Chol Calc (NIH): 116 mg/dL — ABNORMAL HIGH (ref 0–99)
Triglycerides: 615 mg/dL (ref 0–149)
VLDL Cholesterol Cal: 109 mg/dL — ABNORMAL HIGH (ref 5–40)

## 2021-09-11 ENCOUNTER — Encounter: Payer: Self-pay | Admitting: Internal Medicine

## 2021-09-11 ENCOUNTER — Ambulatory Visit (INDEPENDENT_AMBULATORY_CARE_PROVIDER_SITE_OTHER): Payer: HMO | Admitting: Internal Medicine

## 2021-09-11 ENCOUNTER — Ambulatory Visit: Payer: HMO | Admitting: Urology

## 2021-09-11 ENCOUNTER — Emergency Department
Admission: EM | Admit: 2021-09-11 | Discharge: 2021-09-11 | Disposition: A | Discharge status: Home / Self Care | Payer: HMO | Attending: Emergency Medicine | Admitting: Emergency Medicine

## 2021-09-11 ENCOUNTER — Other Ambulatory Visit: Payer: Self-pay

## 2021-09-11 VITALS — BP 226/141 | HR 101 | Temp 98.1°F | Ht 70.0 in | Wt 276.0 lb

## 2021-09-11 DIAGNOSIS — E785 Hyperlipidemia, unspecified: Secondary | ICD-10-CM | POA: Insufficient documentation

## 2021-09-11 DIAGNOSIS — E119 Type 2 diabetes mellitus without complications: Secondary | ICD-10-CM | POA: Diagnosis not present

## 2021-09-11 DIAGNOSIS — I2510 Atherosclerotic heart disease of native coronary artery without angina pectoris: Secondary | ICD-10-CM | POA: Insufficient documentation

## 2021-09-11 DIAGNOSIS — R339 Retention of urine, unspecified: Secondary | ICD-10-CM | POA: Insufficient documentation

## 2021-09-11 DIAGNOSIS — I10 Essential (primary) hypertension: Secondary | ICD-10-CM | POA: Diagnosis not present

## 2021-09-11 DIAGNOSIS — J449 Chronic obstructive pulmonary disease, unspecified: Secondary | ICD-10-CM | POA: Insufficient documentation

## 2021-09-11 LAB — URINALYSIS, ROUTINE W REFLEX MICROSCOPIC
Bilirubin Urine: NEGATIVE
Glucose, UA: 150 mg/dL — AB
Hgb urine dipstick: NEGATIVE
Ketones, ur: NEGATIVE mg/dL
Nitrite: NEGATIVE
Protein, ur: >=300 mg/dL — AB
Specific Gravity, Urine: 1.015 (ref 1.005–1.030)
pH: 5 (ref 5.0–8.0)

## 2021-09-11 LAB — BASIC METABOLIC PANEL
Anion gap: 9 (ref 5–15)
BUN: 33 mg/dL — ABNORMAL HIGH (ref 8–23)
CO2: 25 mmol/L (ref 22–32)
Calcium: 10 mg/dL (ref 8.9–10.3)
Chloride: 104 mmol/L (ref 98–111)
Creatinine, Ser: 2.87 mg/dL — ABNORMAL HIGH (ref 0.61–1.24)
GFR, Estimated: 21 mL/min — ABNORMAL LOW (ref >60)
Glucose, Bld: 237 mg/dL — ABNORMAL HIGH (ref 70–99)
Potassium: 3.7 mmol/L (ref 3.5–5.1)
Sodium: 138 mmol/L (ref 135–145)

## 2021-09-11 LAB — CBC
HCT: 41.9 % (ref 39.0–52.0)
Hemoglobin: 13.8 g/dL (ref 13.0–17.0)
MCH: 29.9 pg (ref 26.0–34.0)
MCHC: 32.9 g/dL (ref 30.0–36.0)
MCV: 90.9 fL (ref 80.0–100.0)
Platelets: 224 10*3/uL (ref 150–400)
RBC: 4.61 MIL/uL (ref 4.22–5.81)
RDW: 12.8 % (ref 11.5–15.5)
WBC: 6.1 10*3/uL (ref 4.0–10.5)
nRBC: 0 % (ref 0.0–0.2)

## 2021-09-11 MED ORDER — TAMSULOSIN HCL 0.4 MG PO CAPS: 0.4000 mg | ORAL_CAPSULE | Freq: Every day | ORAL | 5 refills | Status: DC

## 2021-09-11 MED ORDER — ROSUVASTATIN CALCIUM 40 MG PO TABS: 40.0000 mg | ORAL_TABLET | Freq: Every day | ORAL | 3 refills | Status: DC

## 2021-09-11 MED ORDER — TAMSULOSIN HCL 0.4 MG PO CAPS: 0.4000 mg | ORAL_CAPSULE | Freq: Every day | ORAL | 1 refills | Status: DC

## 2021-09-11 MED ORDER — FENOFIBRATE 145 MG PO TABS: 145.0000 mg | ORAL_TABLET | Freq: Every day | ORAL | 1 refills | Status: DC

## 2021-09-11 NOTE — ED Notes (Signed)
Leg bag placed  without diff d/c inst to pt.  Pt signed esignature.

## 2021-09-11 NOTE — ED Provider Notes (Signed)
[facility] Provider Note    Event Date/Time   First MD Initiated Contact with Patient 09/11/21 1311     (approximate)   History   Chief Complaint Urinary Retention   HPI  Justin Griffith is a 81 y.o. male with past medical history of hypertension, hyperlipidemia, diabetes, stroke, COPD, CAD, and BPH who presents to the ED complaining of difficulty urinating.  Patient reports that for the past week he has been only able to dribble a small amount of urine out when he attempts to pee.  He has been dealing with burning discomfort and sensation of distention in his suprapubic area, denies any associated fevers, nausea, or vomiting.  He does state it feels like the pain is beginning to radiate around to both flanks.  He was prescribed Flomax for this by his PCP a few days ago without relief, states when he has dealt with this in the past the Flomax has helped and he has never required Foley catheter placement.  He denies any burning when he urinates or hematuria.     Physical Exam   Triage Vital Signs: ED Triage Vitals  Enc Vitals Group     BP 09/11/21 1257 (!) 203/107     Pulse Rate 09/11/21 1257 (!) 115     Resp 09/11/21 1257 18     Temp 09/11/21 1257 98 F (36.7 C)     Temp Source 09/11/21 1257 Oral     SpO2 09/11/21 1257 97 %     Weight 09/11/21 1307 275 lb 12.7 oz (125.1 kg)     Height 09/11/21 1307 '5\' 10"'$  (1.778 m)     Head Circumference --      Peak Flow --      Pain Score --      Pain Loc --      Pain Edu? --      Excl. in [place]? --     Most recent vital signs: Vitals:   09/11/21 1257  BP: (!) 203/107  Pulse: (!) 115  Resp: 18  Temp: 98 F (36.7 C)  SpO2: 97%    Constitutional: Alert and oriented. Eyes: Conjunctivae are normal. Head: Atraumatic. Nose: No congestion/rhinnorhea. Mouth/Throat: Mucous membranes are moist.  Cardiovascular: Normal rate, regular rhythm. Grossly normal heart sounds.  2+ radial pulses  bilaterally. Respiratory: Normal respiratory effort.  No retractions. Lungs CTAB. Gastrointestinal: Soft and tender to palpation in the suprapubic area. No distention.  No CVA tenderness bilaterally. Musculoskeletal: No lower extremity tenderness nor edema.  Neurologic:  Normal speech and language. No gross focal neurologic deficits are appreciated.    ED Results / Procedures / Treatments   Labs (all labs ordered are listed, but only abnormal results are displayed) Labs Reviewed  BASIC METABOLIC PANEL - Abnormal; Notable for the following components:      Result Value   Glucose, Bld 237 (*)    BUN 33 (*)    Creatinine, Ser 2.87 (*)    GFR, Estimated 21 (*)    All other components within normal limits  URINALYSIS, ROUTINE W REFLEX MICROSCOPIC - Abnormal; Notable for the following components:   Color, Urine YELLOW (*)    APPearance CLOUDY (*)    Glucose, UA 150 (*)    Protein, ur >=300 (*)    Leukocytes,Ua TRACE (*)    Bacteria, UA MANY (*)    All other components within normal limits  URINE CULTURE  CBC    PROCEDURES:  Critical Care performed: No  Procedures   MEDICATIONS ORDERED IN ED: Medications - No data to display   IMPRESSION / MDM / [place] / ED COURSE  I reviewed the triage vital signs and the nursing notes.                              81 y.o. male with past medical history of hypertension, hyperlipidemia, diabetes, CAD, stroke, COPD, and BPH who presents to the ED for difficulty urinating for about the past week associated with suprapubic discomfort radiating up towards both flanks.  Patient's presentation is most consistent with acute presentation with potential threat to life or bodily function.  Differential diagnosis includes, but is not limited to, urinary retention, UTI, kidney stone, AKI, electrolyte abnormality.  Patient well-appearing and in no acute distress, vital signs remarkable for tachycardia and elevated blood pressure, likely  secondary to patient's bladder discomfort.  Bladder scan shows greater than 1 L and patient would benefit from Foley catheter placement for acute urinary retention.  Labs are pending to assess for AKI, electrolyte abnormality, and UTI.  CBC shows no significant anemia or leukocytosis, patient without CVA tenderness to suggest pyelonephritis and I doubt kidney stone as the cause of his acute urinary retention.  2 separate attempts for Foley catheter placement by nursing staff were unsuccessful.  Case discussed with Dr. Diamantina Providence of urology, who evaluated the patient and was able to place Foley catheter, states urinary retention appears likely due to enlarged prostate.  Urinalysis does not appear consistent with infection, we will send for culture but hold off on antibiotics, especially considering patient is already on Keflex for cellulitis.  We will refill his Flomax and per urology he is appropriate for discharge home with outpatient follow-up and plan for repeat BMP for AKI.  He was counseled to return to the ED for new worsening symptoms, patient agrees with plan.      FINAL CLINICAL IMPRESSION(S) / ED DIAGNOSES   Final diagnoses:  Urinary retention     Rx / DC Orders   ED Discharge Orders          Ordered    tamsulosin (FLOMAX) 0.4 MG CAPS capsule  Daily after supper        09/11/21 1528             Note:  This document was prepared using Dragon voice recognition software and may include unintentional dictation errors.   Blake Divine, MD 09/11/21 [phone]

## 2021-09-11 NOTE — Procedures (Signed)
   UROLOGY PROCEDURE NOTE  Indication: Urinary retention, BPH with prostate > 200 g, nursing unable to place Foley.  Bladder scan greater than 1 L  The patient was prepped and draped in standard sterile fashion.  On exam, the phallus was circumcised with patent meatus.  Urojet was injected into the meatus.  I attempted to pass a 20 [place] coud Foley but met resistance just within the meatus consistent with a fossa navicularis stricture.  I then was able to use a 44F Coloplast coud catheter to gently dilate the fossa navicularis stricture.  The 20 [place] coud catheter was then able to be passed into the urethra and advanced past the prostate and into the bladder with return of amber urine.  10 mL were placed in the balloon.  The catheter was connected to dependent drainage, and the Foley was secured to the thigh.  Plan: Follow-up in 1 week in urology clinic for Foley removal and voiding trial  Nickolas Madrid, MD 09/11/2021

## 2021-09-11 NOTE — Progress Notes (Unsigned)
Ht '5\' 10"'  (1.778 m)   Wt 276 lb (125.2 kg)   BMI 39.60 kg/m    Subjective:    Patient ID: Justin Griffith, male    DOB: May 22, 1940, 81 y.o.   MRN: [id number]  Chief Complaint  Patient presents with  . Urinary Retention    Only getting a dribble, unable to empty bladder for last Friday. Does not have a Urologist     HPI: Justin Griffith is a 81 y.o. male  Dysuria  This is a new problem. The quality of the pain is described as burning. There has been no fever. Associated symptoms include flank pain. Pertinent negatives include no chills, discharge, frequency, hematuria, hesitancy, nausea, possible pregnancy, sweats, urgency or vomiting. Associated symptoms comments: Cannot pee per pt has only had dribbling has some flnak pain .    Chief Complaint  Patient presents with  . Urinary Retention    Only getting a dribble, unable to empty bladder for last Friday. Does not have a Urologist     Relevant past medical, surgical, family and social history reviewed and updated as indicated. Interim medical history since our last visit reviewed. Allergies and medications reviewed and updated.  Review of Systems  Constitutional:  Negative for chills.  Gastrointestinal:  Negative for nausea and vomiting.  Genitourinary:  Positive for dysuria and flank pain. Negative for frequency, hematuria, hesitancy and urgency.    Per HPI unless specifically indicated above     Objective:    Ht '5\' 10"'  (1.778 m)   Wt 276 lb (125.2 kg)   BMI 39.60 kg/m   Wt Readings from Last 3 Encounters:  09/11/21 275 lb 12.7 oz (125.1 kg)  09/11/21 276 lb (125.2 kg)  07/08/21 268 lb 9.6 oz (121.8 kg)    Physical Exam Vitals and nursing note reviewed.  Constitutional:      General: He is not in acute distress.    Appearance: Normal appearance. He is not ill-appearing or diaphoretic.  HENT:     Head: Normocephalic and atraumatic.     Right Ear: Tympanic membrane and external ear normal. There is no  impacted cerumen.     Left Ear: External ear normal.     Nose: No congestion or rhinorrhea.     Mouth/Throat:     Pharynx: No oropharyngeal exudate or posterior oropharyngeal erythema.  Eyes:     Conjunctiva/sclera: Conjunctivae normal.     Pupils: Pupils are equal, round, and reactive to light.  Cardiovascular:     Rate and Rhythm: Normal rate and regular rhythm.     Heart sounds: No murmur heard.    No friction rub. No gallop.  Pulmonary:     Effort: No respiratory distress.     Breath sounds: No stridor. No wheezing or rhonchi.  Chest:     Chest wall: No tenderness.  Abdominal:     General: Abdomen is flat. Bowel sounds are normal.     Palpations: Abdomen is soft. There is no mass.     Tenderness: There is no abdominal tenderness.  Musculoskeletal:        General: No swelling or tenderness.     Cervical back: Normal range of motion and neck supple. No rigidity or tenderness.     Left lower leg: No edema.  Skin:    General: Skin is warm and dry.  Neurological:     Mental Status: He is alert.     Gait: Gait normal.  Psychiatric:  Mood and Affect: Mood normal.        Behavior: Behavior normal.        Thought Content: Thought content normal.        Judgment: Judgment normal.   Results for orders placed or performed in visit on 09/05/21  Bayer DCA Hb A1c Waived  Result Value Ref Range   HB A1C (BAYER DCA - WAIVED) 8.7 (H) 4.8 - 5.6 %  Lipid panel  Result Value Ref Range   Cholesterol, Total 255 (H) 100 - 199 mg/dL   Triglycerides 615 (HH) 0 - 149 mg/dL   HDL 30 (L) >39 mg/dL   VLDL Cholesterol Cal 109 (H) 5 - 40 mg/dL   LDL Chol Calc (NIH) 116 (H) 0 - 99 mg/dL   Chol/HDL Ratio 8.5 (H) 0.0 - 5.0 ratio  Basic metabolic panel  Result Value Ref Range   Glucose 229 (H) 70 - 99 mg/dL   BUN 18 8 - 27 mg/dL   Creatinine, Ser 1.18 0.76 - 1.27 mg/dL   eGFR 62 >59 mL/min/1.73   BUN/Creatinine Ratio 15 10 - 24   Sodium 139 134 - 144 mmol/L   Potassium 4.9 3.5 - 5.2  mmol/L   Chloride 99 96 - 106 mmol/L   CO2 19 (L) 20 - 29 mmol/L   Calcium 10.4 (H) 8.6 - 10.2 mg/dL        Current Outpatient Medications:  .  acyclovir (ZOVIRAX) 400 MG tablet, Take 400 mg by mouth 2 (two) times daily., Disp: , Rfl:  .  aspirin EC 81 MG EC tablet, Take 1 tablet (81 mg total) by mouth daily. Swallow whole., Disp: 30 tablet, Rfl: 0 .  carvedilol (COREG) 25 MG tablet, TAKE ONE TABLET BY MOUTH TWO TIMES A DAY FOR BLOOD PRESSURE, Disp: , Rfl:  .  cephALEXin (KEFLEX) 500 MG capsule, Take 500 mg by mouth 3 (three) times daily., Disp: , Rfl:  .  cloNIDine (CATAPRES) 0.1 MG tablet, Take 0.1 mg by mouth 2 (two) times daily., Disp: , Rfl:  .  fenofibrate (TRICOR) 145 MG tablet, Take 1 tablet (145 mg total) by mouth daily., Disp: 90 tablet, Rfl: 1 .  Fluticasone-Salmeterol (ADVAIR) 100-50 MCG/DOSE AEPB, Inhale 1 puff into the lungs 2 (two) times daily., Disp: 1 each, Rfl: 3 .  hydrALAZINE (APRESOLINE) 50 MG tablet, TAKE ONE TABLET BY MOUTH THREE TIMES A DAY FOR BLOOD PRESSURE, Disp: , Rfl:  .  hydrALAZINE (APRESOLINE) 50 MG tablet, TAKE ONE TABLET BY MOUTH THREE TIMES A DAY FOR BLOOD PRESSURE, Disp: , Rfl:  .  insulin glargine-yfgn (SEMGLEE) 100 UNIT/ML Pen, INJECT 10 UNITS UNDER SKIN ONCE EVERY DAY FOR DIABETES DISCARD PEN 28 DAYS AFTER OPENING. * GIVE AT THE SAME TIME DAILY *, Disp: , Rfl:  .  losartan (COZAAR) 100 MG tablet, Take 100 mg by mouth daily., Disp: , Rfl:  .  magnesium oxide (MAG-OX) 400 (240 Mg) MG tablet, Take 1 tablet (400 mg total) by mouth daily., Disp: 90 tablet, Rfl: 0 .  metFORMIN (GLUCOPHAGE) 1000 MG tablet, Take 1 tablet (1,000 mg total) by mouth 2 (two) times daily with a meal., Disp: 180 tablet, Rfl: 3 .  Torsemide 40 MG TABS, Take 80 mg by mouth 2 (two) times daily., Disp: , Rfl:  .  vitamin B-12 (CYANOCOBALAMIN) 500 MCG tablet, Take 2,000 mcg by mouth at bedtime., Disp: , Rfl:  .  Vitamin D, Cholecalciferol, 25 MCG (1000 UT) CAPS, Take 2,000 mcg by mouth at  bedtime.,  Disp: , Rfl:  .  BREZTRI AEROSPHERE 160-9-4.8 MCG/ACT AERO, Inhale into the lungs., Disp: , Rfl:  .  gabapentin (NEURONTIN) 100 MG capsule, Take 1 capsule (100 mg total) by mouth 2 (two) times daily. (Patient not taking: Reported on 09/11/2021), Disp: 60 capsule, Rfl: 0 .  nortriptyline (PAMELOR) 10 MG capsule, Take 10 mg by mouth 2 (two) times daily., Disp: , Rfl:  .  rosuvastatin (CRESTOR) 40 MG tablet, Take 1 tablet (40 mg total) by mouth at bedtime., Disp: 90 tablet, Rfl: 3 .  tamsulosin (FLOMAX) 0.4 MG CAPS capsule, Take 1 capsule (0.4 mg total) by mouth daily. Patient restarted on his own due to trouble urinating, Disp: 30 capsule, Rfl: 5 .  Tiotropium Bromide Monohydrate (SPIRIVA RESPIMAT) 2.5 MCG/ACT AERS, Inhale 2 puffs into the lungs daily. (Patient not taking: Reported on 09/11/2021), Disp: 1 g, Rfl: 3    Assessment & Plan:     Problem List Items Addressed This Visit   None Visit Diagnoses     Cannot urinate    -  Primary   Relevant Orders   Ambulatory referral to Urology   Urinalysis, Routine w reflex microscopic   Hyperlipidemia, unspecified hyperlipidemia type       Relevant Medications   hydrALAZINE (APRESOLINE) 50 MG tablet   fenofibrate (TRICOR) 145 MG tablet   rosuvastatin (CRESTOR) 40 MG tablet        Orders Placed This Encounter  Procedures  . Urinalysis, Routine w reflex microscopic  . Ambulatory referral to Urology     Meds ordered this encounter  Medications  . tamsulosin (FLOMAX) 0.4 MG CAPS capsule    Sig: Take 1 capsule (0.4 mg total) by mouth daily. Patient restarted on his own due to trouble urinating    Dispense:  30 capsule    Refill:  5  . fenofibrate (TRICOR) 145 MG tablet    Sig: Take 1 tablet (145 mg total) by mouth daily.    Dispense:  90 tablet    Refill:  1  . rosuvastatin (CRESTOR) 40 MG tablet    Sig: Take 1 tablet (40 mg total) by mouth at bedtime.    Dispense:  90 tablet    Refill:  3     Follow up plan: Return in  about 3 weeks (around 10/02/2021).

## 2021-09-11 NOTE — Consult Note (Signed)
Urology Consult   I have been asked to see the patient by Dr. Charna Archer, for evaluation and management of urinary retention.  Chief Complaint: Urinary retention  HPI:  Justin Griffith is a 81 y.o. year old male known to urology service for history of gross hematuria and BPH with prostate measuring 215 g on prior CT.  He never followed up with urology to complete hematuria work-up with cystoscopy.  He reports about a week of worsening urinary symptoms with overflow incontinence and urinary dribbling with lower abdominal pain.  Bladder scan in the ER greater than 1 L.  No cross-sectional imaging to review.  He denies any gross hematuria, fevers, chills, or dysuria.  He continues on Flomax.  Labs notable for AKI with creatinine of 2.87 from a baseline of 1.2.  He was able to void a small amount of urine that was consistent with infection with many bacteria, 11-20 WBCs, 6-10 RBCs, WBC clumps, trace leukocytes, nitrite negative.  RN unable to place Foley and urology was consulted.  PMH: Past Medical History:  Diagnosis Date   Arthritis    Asthma 2000   Back pain    Colon polyp 2012   COPD (chronic obstructive pulmonary disease) (HCC)    Emphysema of lung (HCC)    Heart murmur    Hernia 2000   Hyperlipidemia    Hypertension    Personal history of malignant neoplasm of large intestine    Personal history of tobacco use, presenting hazards to health    Sleep apnea    Special screening for malignant neoplasms, colon    Type 2 diabetes mellitus with proteinuria ([place]) 10/11/2014    Surgical History: Past Surgical History:  Procedure Laterality Date   CARDIAC CATHETERIZATION Left 09/25/2015   Procedure: Left Heart Cath and Coronary Angiography;  Surgeon: Yolonda Kida, MD;  Location: [place] [facility];  Service: Cardiovascular;  Laterality: Left;   CHOLECYSTECTOMY  1970   COLON SURGERY  07-16-99   sigmoid colon resection with primary anastomosis, chemotherapy for metastatic disease    COLONOSCOPY  2001, 2012   Dr Bary Castilla, tubular adenoma of the cecum and ascending colon in 2012.   COLONOSCOPY WITH PROPOFOL N/A 02/12/2016   Procedure: COLONOSCOPY WITH PROPOFOL;  Surgeon: Robert Bellow, MD;  Location: [facility] END[facility];  Service: Endoscopy;  Laterality: N/A;   CORONARY STENT INTERVENTION N/A 05/17/2020   Procedure: CORONARY STENT INTERVENTION;  Surgeon: Yolonda Kida, MD;  Location: [place] [facility];  Service: Cardiovascular;  Laterality: N/A;  LAD   ESOPHAGOGASTRODUODENOSCOPY (EGD) WITH PROPOFOL N/A 02/12/2016   Procedure: ESOPHAGOGASTRODUODENOSCOPY (EGD) WITH PROPOFOL;  Surgeon: Robert Bellow, MD;  Location: [facility] ENDOSCOPY;  Service: Endoscopy;  Laterality: N/A;   HERNIA REPAIR Right    right inguinal hernia repair   JOINT REPLACEMENT Bilateral    knee replacement   KNEE SURGERY Bilateral 2010   LEFT HEART CATH AND CORONARY ANGIOGRAPHY N/A 05/17/2020   Procedure: LEFT HEART CATH AND CORONARY ANGIOGRAPHY possible PCI and stent;  Surgeon: Yolonda Kida, MD;  Location: [place] [facility];  Service: Cardiovascular;  Laterality: N/A;   MYRINGOTOMY WITH TUBE PLACEMENT Bilateral 08/16/2014   Procedure: MYRINGOTOMY WITH TUBE PLACEMENT;  Surgeon: Carloyn Manner, MD;  Location: [facility] ORS;  Service: ENT;  Laterality: Bilateral;   PERIPHERAL VASCULAR CATHETERIZATION Right 09/04/2015   Procedure: Lower Extremity Angiography;  Surgeon: Katha Cabal, MD;  Location: [place] [facility];  Service: Cardiovascular;  Laterality: Right;   PERIPHERAL  VASCULAR CATHETERIZATION  09/04/2015   Procedure: Lower Extremity Intervention;  Surgeon: Katha Cabal, MD;  Location: [place] [facility];  Service: Cardiovascular;;   TONSILLECTOMY     TYMPANOSTOMY TUBE PLACEMENT        Allergies:  Allergies  Allergen Reactions   Farxiga [Dapagliflozin] Rash    Causes a severe rash in groin area    Dulaglutide Diarrhea and Other (See Comments)   Liraglutide Diarrhea and  Other (See Comments)   Jardiance [Empagliflozin] Rash    Family History: Family History  Problem Relation Age of Onset   Diabetes Father    Esophageal cancer Mother    Alzheimer's disease Paternal Uncle     Social History:  reports that he quit smoking about 32 years ago. His smoking use included cigarettes. He has a 30.00 pack-year smoking history. He has never used smokeless tobacco. He reports that he does not drink alcohol and does not use drugs.  ROS: Negative aside from those stated in the HPI.  Physical Exam: BP (!) 203/107   Pulse (!) 115   Temp 98 F (36.7 C) (Oral)   Resp 18   Ht '5\' 10"'$  (1.778 m)   Wt 125.1 kg   SpO2 97%   BMI 39.57 kg/m    Constitutional:  Alert and oriented, No acute distress. Cardiovascular: No clubbing, cyanosis, or edema. Respiratory: Normal respiratory effort, no increased work of breathing. GI: Abdomen is soft, nontender, nondistended, no abdominal masses GU: Uncircumcised phallus with patent meatus Lymph: No cervical or inguinal lymphadenopathy. Skin: No rashes, bruises or suspicious lesions. Neurologic: Grossly intact, no focal deficits, moving all 4 extremities. Psychiatric: Normal mood and affect.   Laboratory Data: Reviewed, see HPI  See procedure note for Foley insertion details  Assessment & Plan:   81 year old male with history of BPH and prostate measuring 215 g on prior CT as well as recurrent hematuria.  He has no showed multiple follow-up visits with urology outpatient.  He presented to the ER today with overflow incontinence and increased difficulty urinating with lower abdominal pain with urinary retention secondary to BPH.  Urinalysis also suspicious for infection/UTI.  Foley placed at bedside with return of amber urine.  Recommendations: -Antibiotics for UTI, follow-up cultures -We will arrange outpatient urology follow-up in 1 week for Foley removal and voiding trial -May need to consider HOLEP for outlet procedure  in the future with his history of gross hematuria secondary to BPH, UTI, and urinary retention  Billey Co, MD   [facility] [address] [place], [place] [address] [phone]

## 2021-09-11 NOTE — ED Triage Notes (Addendum)
Pt comes pov with urinary retention. States he has been "trickling" and hasn't been able to urinate fully in about a week. Takes BP meds but hasn't taken them today.   Pt has also been on an antibiotic for about a week for a leg infection by dr Clayborn Bigness

## 2021-09-12 LAB — URINE CULTURE: Culture: NO GROWTH

## 2021-09-15 ENCOUNTER — Other Ambulatory Visit: Payer: Self-pay | Admitting: Internal Medicine

## 2021-09-15 ENCOUNTER — Telehealth: Payer: Self-pay | Admitting: *Deleted

## 2021-09-15 ENCOUNTER — Emergency Department
Admission: EM | Admit: 2021-09-15 | Discharge: 2021-09-16 | Disposition: A | Discharge status: Home / Self Care | Payer: HMO | Source: Home / Self Care | Attending: Emergency Medicine | Admitting: Emergency Medicine

## 2021-09-15 DIAGNOSIS — R600 Localized edema: Secondary | ICD-10-CM | POA: Diagnosis not present

## 2021-09-15 DIAGNOSIS — Z85038 Personal history of other malignant neoplasm of large intestine: Secondary | ICD-10-CM | POA: Diagnosis not present

## 2021-09-15 DIAGNOSIS — E119 Type 2 diabetes mellitus without complications: Secondary | ICD-10-CM | POA: Diagnosis not present

## 2021-09-15 DIAGNOSIS — R319 Hematuria, unspecified: Secondary | ICD-10-CM | POA: Diagnosis not present

## 2021-09-15 DIAGNOSIS — N138 Other obstructive and reflux uropathy: Secondary | ICD-10-CM | POA: Diagnosis not present

## 2021-09-15 DIAGNOSIS — I6529 Occlusion and stenosis of unspecified carotid artery: Secondary | ICD-10-CM | POA: Diagnosis not present

## 2021-09-15 DIAGNOSIS — R55 Syncope and collapse: Secondary | ICD-10-CM | POA: Diagnosis not present

## 2021-09-15 DIAGNOSIS — R350 Frequency of micturition: Secondary | ICD-10-CM | POA: Diagnosis not present

## 2021-09-15 DIAGNOSIS — E1140 Type 2 diabetes mellitus with diabetic neuropathy, unspecified: Secondary | ICD-10-CM | POA: Diagnosis present

## 2021-09-15 DIAGNOSIS — I2609 Other pulmonary embolism with acute cor pulmonale: Secondary | ICD-10-CM | POA: Diagnosis not present

## 2021-09-15 DIAGNOSIS — S80212A Abrasion, left knee, initial encounter: Secondary | ICD-10-CM | POA: Diagnosis present

## 2021-09-15 DIAGNOSIS — J45909 Unspecified asthma, uncomplicated: Secondary | ICD-10-CM | POA: Insufficient documentation

## 2021-09-15 DIAGNOSIS — J449 Chronic obstructive pulmonary disease, unspecified: Secondary | ICD-10-CM | POA: Diagnosis not present

## 2021-09-15 DIAGNOSIS — I2510 Atherosclerotic heart disease of native coronary artery without angina pectoris: Secondary | ICD-10-CM | POA: Insufficient documentation

## 2021-09-15 DIAGNOSIS — N390 Urinary tract infection, site not specified: Secondary | ICD-10-CM | POA: Diagnosis present

## 2021-09-15 DIAGNOSIS — G4733 Obstructive sleep apnea (adult) (pediatric): Secondary | ICD-10-CM | POA: Diagnosis present

## 2021-09-15 DIAGNOSIS — I10 Essential (primary) hypertension: Secondary | ICD-10-CM | POA: Diagnosis present

## 2021-09-15 DIAGNOSIS — I6782 Cerebral ischemia: Secondary | ICD-10-CM | POA: Diagnosis not present

## 2021-09-15 DIAGNOSIS — I639 Cerebral infarction, unspecified: Secondary | ICD-10-CM | POA: Diagnosis not present

## 2021-09-15 DIAGNOSIS — Z87891 Personal history of nicotine dependence: Secondary | ICD-10-CM | POA: Diagnosis not present

## 2021-09-15 DIAGNOSIS — Z79899 Other long term (current) drug therapy: Secondary | ICD-10-CM | POA: Diagnosis not present

## 2021-09-15 DIAGNOSIS — Z7982 Long term (current) use of aspirin: Secondary | ICD-10-CM | POA: Insufficient documentation

## 2021-09-15 DIAGNOSIS — T675XXA Heat exhaustion, unspecified, initial encounter: Secondary | ICD-10-CM | POA: Diagnosis not present

## 2021-09-15 DIAGNOSIS — E785 Hyperlipidemia, unspecified: Secondary | ICD-10-CM | POA: Diagnosis present

## 2021-09-15 DIAGNOSIS — Y92007 Garden or yard of unspecified non-institutional (private) residence as the place of occurrence of the external cause: Secondary | ICD-10-CM | POA: Diagnosis not present

## 2021-09-15 DIAGNOSIS — G319 Degenerative disease of nervous system, unspecified: Secondary | ICD-10-CM | POA: Diagnosis not present

## 2021-09-15 DIAGNOSIS — I2699 Other pulmonary embolism without acute cor pulmonale: Secondary | ICD-10-CM | POA: Diagnosis not present

## 2021-09-15 DIAGNOSIS — I824Y1 Acute embolism and thrombosis of unspecified deep veins of right proximal lower extremity: Secondary | ICD-10-CM

## 2021-09-15 DIAGNOSIS — J439 Emphysema, unspecified: Secondary | ICD-10-CM | POA: Diagnosis present

## 2021-09-15 DIAGNOSIS — Z833 Family history of diabetes mellitus: Secondary | ICD-10-CM | POA: Diagnosis not present

## 2021-09-15 DIAGNOSIS — S80211A Abrasion, right knee, initial encounter: Secondary | ICD-10-CM | POA: Diagnosis present

## 2021-09-15 DIAGNOSIS — R338 Other retention of urine: Secondary | ICD-10-CM | POA: Diagnosis not present

## 2021-09-15 DIAGNOSIS — N2889 Other specified disorders of kidney and ureter: Secondary | ICD-10-CM | POA: Diagnosis not present

## 2021-09-15 DIAGNOSIS — I700 Atherosclerosis of aorta: Secondary | ICD-10-CM | POA: Diagnosis present

## 2021-09-15 DIAGNOSIS — I2694 Multiple subsegmental pulmonary emboli without acute cor pulmonale: Secondary | ICD-10-CM | POA: Diagnosis present

## 2021-09-15 DIAGNOSIS — Z96653 Presence of artificial knee joint, bilateral: Secondary | ICD-10-CM | POA: Diagnosis present

## 2021-09-15 DIAGNOSIS — R4020 Unspecified coma: Secondary | ICD-10-CM | POA: Diagnosis not present

## 2021-09-15 DIAGNOSIS — R9431 Abnormal electrocardiogram [ECG] [EKG]: Secondary | ICD-10-CM | POA: Diagnosis not present

## 2021-09-15 DIAGNOSIS — R911 Solitary pulmonary nodule: Secondary | ICD-10-CM | POA: Diagnosis not present

## 2021-09-15 DIAGNOSIS — R339 Retention of urine, unspecified: Secondary | ICD-10-CM | POA: Diagnosis not present

## 2021-09-15 DIAGNOSIS — Z888 Allergy status to other drugs, medicaments and biological substances status: Secondary | ICD-10-CM | POA: Diagnosis not present

## 2021-09-15 DIAGNOSIS — S299XXA Unspecified injury of thorax, initial encounter: Secondary | ICD-10-CM | POA: Diagnosis not present

## 2021-09-15 DIAGNOSIS — Z66 Do not resuscitate: Secondary | ICD-10-CM | POA: Diagnosis present

## 2021-09-15 DIAGNOSIS — M79605 Pain in left leg: Secondary | ICD-10-CM | POA: Diagnosis not present

## 2021-09-15 DIAGNOSIS — Z794 Long term (current) use of insulin: Secondary | ICD-10-CM | POA: Diagnosis not present

## 2021-09-15 DIAGNOSIS — T83511A Infection and inflammatory reaction due to indwelling urethral catheter, initial encounter: Secondary | ICD-10-CM | POA: Diagnosis not present

## 2021-09-15 DIAGNOSIS — N400 Enlarged prostate without lower urinary tract symptoms: Secondary | ICD-10-CM | POA: Diagnosis present

## 2021-09-15 DIAGNOSIS — W1830XA Fall on same level, unspecified, initial encounter: Secondary | ICD-10-CM | POA: Diagnosis present

## 2021-09-15 DIAGNOSIS — Z951 Presence of aortocoronary bypass graft: Secondary | ICD-10-CM | POA: Diagnosis not present

## 2021-09-15 DIAGNOSIS — R404 Transient alteration of awareness: Secondary | ICD-10-CM | POA: Diagnosis not present

## 2021-09-15 DIAGNOSIS — N401 Enlarged prostate with lower urinary tract symptoms: Secondary | ICD-10-CM | POA: Diagnosis not present

## 2021-09-15 DIAGNOSIS — J9811 Atelectasis: Secondary | ICD-10-CM | POA: Diagnosis not present

## 2021-09-15 DIAGNOSIS — R0902 Hypoxemia: Secondary | ICD-10-CM | POA: Diagnosis present

## 2021-09-15 DIAGNOSIS — E1151 Type 2 diabetes mellitus with diabetic peripheral angiopathy without gangrene: Secondary | ICD-10-CM | POA: Diagnosis present

## 2021-09-15 DIAGNOSIS — Z01818 Encounter for other preprocedural examination: Secondary | ICD-10-CM | POA: Diagnosis not present

## 2021-09-15 LAB — CBC WITH DIFFERENTIAL/PLATELET
Abs Immature Granulocytes: 0.05 10*3/uL (ref 0.00–0.07)
Basophils Absolute: 0.1 10*3/uL (ref 0.0–0.1)
Basophils Relative: 1 %
Eosinophils Absolute: 0.1 10*3/uL (ref 0.0–0.5)
Eosinophils Relative: 1 %
HCT: 40.2 % (ref 39.0–52.0)
Hemoglobin: 13.2 g/dL (ref 13.0–17.0)
Immature Granulocytes: 1 %
Lymphocytes Relative: 10 %
Lymphs Abs: 1 10*3/uL (ref 0.7–4.0)
MCH: 29.7 pg (ref 26.0–34.0)
MCHC: 32.8 g/dL (ref 30.0–36.0)
MCV: 90.5 fL (ref 80.0–100.0)
Monocytes Absolute: 0.8 10*3/uL (ref 0.1–1.0)
Monocytes Relative: 8 %
Neutro Abs: 7.9 10*3/uL — ABNORMAL HIGH (ref 1.7–7.7)
Neutrophils Relative %: 79 %
Platelets: 210 10*3/uL (ref 150–400)
RBC: 4.44 MIL/uL (ref 4.22–5.81)
RDW: 12.3 % (ref 11.5–15.5)
WBC: 9.8 10*3/uL (ref 4.0–10.5)
nRBC: 0 % (ref 0.0–0.2)

## 2021-09-15 LAB — COMPREHENSIVE METABOLIC PANEL
ALT: 12 U/L (ref 0–44)
AST: 16 U/L (ref 15–41)
Albumin: 3 g/dL — ABNORMAL LOW (ref 3.5–5.0)
Alkaline Phosphatase: 61 U/L (ref 38–126)
Anion gap: 9 (ref 5–15)
BUN: 39 mg/dL — ABNORMAL HIGH (ref 8–23)
CO2: 27 mmol/L (ref 22–32)
Calcium: 9.6 mg/dL (ref 8.9–10.3)
Chloride: 102 mmol/L (ref 98–111)
Creatinine, Ser: 2.57 mg/dL — ABNORMAL HIGH (ref 0.61–1.24)
GFR, Estimated: 24 mL/min — ABNORMAL LOW (ref >60)
Glucose, Bld: 213 mg/dL — ABNORMAL HIGH (ref 70–99)
Potassium: 4.2 mmol/L (ref 3.5–5.1)
Sodium: 138 mmol/L (ref 135–145)
Total Bilirubin: 0.9 mg/dL (ref 0.3–1.2)
Total Protein: 7.1 g/dL (ref 6.5–8.1)

## 2021-09-15 LAB — URINALYSIS, ROUTINE W REFLEX MICROSCOPIC
Bilirubin Urine: NEGATIVE
Glucose, UA: 50 mg/dL — AB
Ketones, ur: NEGATIVE mg/dL
Leukocytes,Ua: NEGATIVE
Nitrite: NEGATIVE
Protein, ur: 100 mg/dL — AB
RBC / HPF: >50 RBC/hpf — ABNORMAL HIGH (ref 0–5)
Specific Gravity, Urine: 1.013 (ref 1.005–1.030)
Squamous Epithelial / HPF: NONE SEEN (ref 0–5)
pH: 5 (ref 5.0–8.0)

## 2021-09-15 NOTE — ED Triage Notes (Signed)
Pt presents via POV c/o urinary retention since yesterday. Pt has a foley in place. Denies fevers.

## 2021-09-17 ENCOUNTER — Telehealth: Payer: Self-pay | Admitting: *Deleted

## 2021-09-17 NOTE — Telephone Encounter (Signed)
Transition Care Management Follow-up Telephone Call Date of discharge and from where: [facility] 09-15-2021 How have you been since you were released from the hospital? Feeling ok little buring at cath site Any questions or concerns? No  Items Reviewed: Did the pt receive and understand the discharge instructions provided? Yes  Medications obtained and verified? Yes  Other? No  Any new allergies since your discharge? No  Dietary orders reviewed? No Do you have support at home? Yes   Home Care and Equipment/Supplies: Were home health services ordered?  If so, what is the name of the agency?   Has the agency set up a time to come to the patient's home?  Were any new equipment or medical supplies ordered?   What is the name of the medical supply agency?  Were you able to get the supplies/equipment?  Do you have any questions related to the use of the equipment or supplies?   Functional Questionnaire: (I = Independent and D = Dependent) ADLs: I  Bathing/Dressing- I  Meal Prep- I  Eating- I  Maintaining continence- HAS A CATH  Transferring/Ambulation- I  Managing Meds- I  Follow up appointments reviewed:  PCP Hospital f/u appt confirmed? No  [facility] f/u appt confirmed? Yes  Scheduled to see 09-19-2021  UROLOGY Are transportation arrangements needed? NO If their condition worsens, is the pt aware to call PCP or go to the Emergency Dept.? Yes Was the patient provided with contact information for the PCP's office or ED? Yes Was to pt encouraged to call back with questions or concerns? Yes

## 2021-09-18 ENCOUNTER — Ambulatory Visit: Payer: HMO | Admitting: Urology

## 2021-09-18 ENCOUNTER — Ambulatory Visit (INDEPENDENT_AMBULATORY_CARE_PROVIDER_SITE_OTHER): Payer: HMO | Admitting: Urology

## 2021-09-18 ENCOUNTER — Encounter: Payer: Self-pay | Admitting: Urology

## 2021-09-18 ENCOUNTER — Other Ambulatory Visit: Payer: Self-pay | Admitting: Urology

## 2021-09-18 VITALS — BP 147/73 | HR 81 | Ht 70.0 in | Wt 275.0 lb

## 2021-09-18 DIAGNOSIS — N401 Enlarged prostate with lower urinary tract symptoms: Secondary | ICD-10-CM | POA: Diagnosis not present

## 2021-09-18 DIAGNOSIS — R338 Other retention of urine: Secondary | ICD-10-CM

## 2021-09-18 DIAGNOSIS — N138 Other obstructive and reflux uropathy: Secondary | ICD-10-CM | POA: Diagnosis not present

## 2021-09-18 DIAGNOSIS — Z01818 Encounter for other preprocedural examination: Secondary | ICD-10-CM | POA: Diagnosis not present

## 2021-09-18 LAB — BLADDER SCAN AMB NON-IMAGING

## 2021-09-18 NOTE — Progress Notes (Signed)
Catheter Removal  Patient is present today for a catheter removal.  30 ml of water was drained from the balloon. A 20 FR foley cath was removed from the bladder no complications were noted . Patient tolerated well.  Performed by: Kerman Passey, RMA  Follow up/ Additional notes: pt will return in the PM with MD for PVR

## 2021-09-18 NOTE — Progress Notes (Signed)
Surgical Physician Order Form [facility]  * Scheduling expectation : Next Available (needs to be a 1 HOLEP today, very large prostate and will be this challenging case)  *Length of Case: 3 hours  *Clearance needed: yes, PCP, numerous comorbidities  *Anticoagulation Instructions: Hold all anticoagulants  *Aspirin Instructions: Hold Aspirin  *Post-op visit Date/Instructions:  1-3 day cath removal  *Diagnosis: BPH w/BOO  *Procedure:     HOLEP (22482)   Additional orders: N/A  -Admit type: OUTpatient  -Anesthesia: General  -VTE Prophylaxis Standing Order SCD's       Other:   -Standing Lab Orders Per Anesthesia    Lab other: UA&Urine Culture  -Standing Test orders EKG/Chest x-ray per Anesthesia       Test other:   - Medications:  Ancef 2gm IV  -Other orders:  N/A

## 2021-09-18 NOTE — Progress Notes (Signed)
09/18/21 9:22 AM   Justin Griffith 07/27/1940 [id number]  Referring provider:  Charlynne Cousins, MD [address] [place],  [place] [address]  No chief complaint on file.   Urological history  BPH with urinary retention  - Passed voiding trial in 02/2020 after hospitalization for gross hematuria and urinary retention  - Seen in ED on 09/11/2021 for urinary retention. PVR showed  greater than 1 L. 2 separate attempts for Foley catheter placement by nursing staff were unsuccessful.  - Dr. Diamantina Providence placed 20 FR coude foley catheter on 09/11/2021 - Discharged on Flomax    2. Gross hematuria  - Seen in ED on 09/15/2021. 20 FR coude catheter was replaced by 14 FR. He had a 3-way catheter placed and irrigated    HPI: Justin Griffith is a 81 y.o.male who presents today for a voiding trial.   Foley removed.   PMH: Past Medical History:  Diagnosis Date   Arthritis    Asthma 2000   Back pain    Colon polyp 2012   COPD (chronic obstructive pulmonary disease) (HCC)    Emphysema of lung (HCC)    Heart murmur    Hernia 2000   Hyperlipidemia    Hypertension    Personal history of malignant neoplasm of large intestine    Personal history of tobacco use, presenting hazards to health    Sleep apnea    Special screening for malignant neoplasms, colon    Type 2 diabetes mellitus with proteinuria ([place]) 10/11/2014    Surgical History: Past Surgical History:  Procedure Laterality Date   CARDIAC CATHETERIZATION Left 09/25/2015   Procedure: Left Heart Cath and Coronary Angiography;  Surgeon: Yolonda Kida, MD;  Location: [facility];  Service: Cardiovascular;  Laterality: Left;   CHOLECYSTECTOMY  1970   COLON SURGERY  07-16-99   sigmoid colon resection with primary anastomosis, chemotherapy for metastatic disease   COLONOSCOPY  2001, 2012   Dr Bary Castilla, tubular adenoma of the cecum and ascending colon in 2012.   COLONOSCOPY WITH PROPOFOL N/A 02/12/2016   Procedure: COLONOSCOPY WITH PROPOFOL;   Surgeon: Robert Bellow, MD;  Location: [facility] ENDOSCOPY;  Service: Endoscopy;  Laterality: N/A;   CORONARY STENT INTERVENTION N/A 05/17/2020   Procedure: CORONARY STENT INTERVENTION;  Surgeon: Yolonda Kida, MD;  Location: [place] [facility];  Service: Cardiovascular;  Laterality: N/A;  LAD   ESOPHAGOGASTRODUODENOSCOPY (EGD) WITH PROPOFOL N/A 02/12/2016   Procedure: ESOPHAGOGASTRODUODENOSCOPY (EGD) WITH PROPOFOL;  Surgeon: Robert Bellow, MD;  Location: [facility] ENDOSCOPY;  Service: Endoscopy;  Laterality: N/A;   HERNIA REPAIR Right    right inguinal hernia repair   JOINT REPLACEMENT Bilateral    knee replacement   KNEE SURGERY Bilateral 2010   LEFT HEART CATH AND CORONARY ANGIOGRAPHY N/A 05/17/2020   Procedure: LEFT HEART CATH AND CORONARY ANGIOGRAPHY possible PCI and stent;  Surgeon: Yolonda Kida, MD;  Location: [place] [facility];  Service: Cardiovascular;  Laterality: N/A;   MYRINGOTOMY WITH TUBE PLACEMENT Bilateral 08/16/2014   Procedure: MYRINGOTOMY WITH TUBE PLACEMENT;  Surgeon: Carloyn Manner, MD;  Location: [facility];  Service: ENT;  Laterality: Bilateral;   PERIPHERAL VASCULAR CATHETERIZATION Right 09/04/2015   Procedure: Lower Extremity Angiography;  Surgeon: Katha Cabal, MD;  Location: [place] [facility];  Service: Cardiovascular;  Laterality: Right;   PERIPHERAL VASCULAR CATHETERIZATION  09/04/2015   Procedure: Lower Extremity Intervention;  Surgeon: Katha Cabal, MD;  Location: [place] [place] [facility];  Service: Cardiovascular;;  TONSILLECTOMY     TYMPANOSTOMY TUBE PLACEMENT      Home Medications:  Allergies as of 09/18/2021       Reactions   Farxiga [dapagliflozin] Rash   Causes a severe rash in groin area   Dulaglutide Diarrhea, Other (See Comments)   Liraglutide Diarrhea, Other (See Comments)   Jardiance [empagliflozin] Rash        Medication List        Accurate as of September 18, 2021  9:22 AM. If you have any questions, ask your nurse or  doctor.          acyclovir 400 MG tablet Commonly known as: ZOVIRAX Take 400 mg by mouth 2 (two) times daily.   aspirin EC 81 MG tablet Take 1 tablet (81 mg total) by mouth daily. Swallow whole.   Breztri Aerosphere 160-9-4.8 MCG/ACT Aero Generic drug: Budeson-Glycopyrrol-Formoterol Inhale into the lungs.   carvedilol 25 MG tablet Commonly known as: COREG TAKE ONE TABLET BY MOUTH TWO TIMES A DAY FOR BLOOD PRESSURE   cephALEXin 500 MG capsule Commonly known as: KEFLEX Take 500 mg by mouth 3 (three) times daily.   cloNIDine 0.1 MG tablet Commonly known as: CATAPRES Take 0.1 mg by mouth 2 (two) times daily.   fenofibrate 145 MG tablet Commonly known as: Tricor Take 1 tablet (145 mg total) by mouth daily.   Fluticasone-Salmeterol 100-50 MCG/DOSE Aepb Commonly known as: ADVAIR Inhale 1 puff into the lungs 2 (two) times daily.   gabapentin 100 MG capsule Commonly known as: NEURONTIN Take 1 capsule (100 mg total) by mouth 2 (two) times daily.   hydrALAZINE 50 MG tablet Commonly known as: APRESOLINE TAKE ONE TABLET BY MOUTH THREE TIMES A DAY FOR BLOOD PRESSURE   hydrALAZINE 50 MG tablet Commonly known as: APRESOLINE TAKE ONE TABLET BY MOUTH THREE TIMES A DAY FOR BLOOD PRESSURE   insulin glargine-yfgn 100 UNIT/ML Pen Commonly known as: SEMGLEE INJECT 10 UNITS UNDER SKIN ONCE EVERY DAY FOR DIABETES DISCARD PEN 28 DAYS AFTER OPENING. * GIVE AT THE SAME TIME DAILY *   losartan 100 MG tablet Commonly known as: COZAAR Take 100 mg by mouth daily.   magnesium oxide 400 (240 Mg) MG tablet Commonly known as: MAG-OX Take 1 tablet (400 mg total) by mouth daily.   metFORMIN 1000 MG tablet Commonly known as: GLUCOPHAGE Take 1 tablet (1,000 mg total) by mouth 2 (two) times daily with a meal.   nortriptyline 10 MG capsule Commonly known as: PAMELOR Take 10 mg by mouth 2 (two) times daily.   rosuvastatin 40 MG tablet Commonly known as: CRESTOR Take 1 tablet (40 mg total)  by mouth at bedtime.   Spiriva Respimat 2.5 MCG/ACT Aers Generic drug: Tiotropium Bromide Monohydrate Inhale 2 puffs into the lungs daily.   tamsulosin 0.4 MG Caps capsule Commonly known as: FLOMAX Take 1 capsule (0.4 mg total) by mouth daily after supper.   Torsemide 40 MG Tabs Take 80 mg by mouth 2 (two) times daily.   vitamin B-12 500 MCG tablet Commonly known as: CYANOCOBALAMIN Take 2,000 mcg by mouth at bedtime.   Vitamin D (Cholecalciferol) 25 MCG (1000 UT) Caps Take 2,000 mcg by mouth at bedtime.        Allergies:  Allergies  Allergen Reactions   Farxiga [Dapagliflozin] Rash    Causes a severe rash in groin area    Dulaglutide Diarrhea and Other (See Comments)   Liraglutide Diarrhea and Other (See Comments)   Jardiance [Empagliflozin] Rash  Family History: Family History  Problem Relation Age of Onset   Diabetes Father    Esophageal cancer Mother    Alzheimer's disease Paternal Uncle     Social History:  reports that he quit smoking about 32 years ago. His smoking use included cigarettes. He has a 30.00 pack-year smoking history. He has never used smokeless tobacco. He reports that he does not drink alcohol and does not use drugs.   Laboratory Data: Lab Results  Component Value Date   CREATININE 2.57 (H) 09/15/2021     Lab Results  Component Value Date   HGBA1C 8.7 (H) 09/05/2021   Assessment & Plan:    1. Urinary retention -Foley removed    Return for return this afternoon for PVR .  [facility] [address], [place], [place] [address] [phone]  I, Kirke Shaggy Littlejohn,acting as a scribe for [facility], PA-C.,have documented all relevant documentation on the behalf of Eliyahu Bille, PA-C,as directed by  [facility], PA-C while in the presence of [place], PA-C.  I have reviewed the above documentation for accuracy and completeness, and I agree with the above.    Zara Council, PA-C

## 2021-09-18 NOTE — Patient Instructions (Signed)
Holmium Laser Enucleation of the Prostate (HoLEP)  HoLEP is a treatment for men with benign prostatic hyperplasia (BPH). The laser surgery removed blockages of urine flow, and is done without any incisions on the body.     What is HoLEP?  HoLEP is a type of laser surgery used to treat obstruction (blockage) of urine flow as a result of benign prostatic hyperplasia (BPH). In men with BPH, the prostate gland is not cancerous, but has become enlarged. An enlarged prostate can result in a number of urinary tract symptoms such as weak urinary stream, difficulty in starting urination, inability to urinate, frequent urination, or getting up at night to urinate.  HoLEP was developed in the 1990's as a more effective and less expensive surgical option for BPH, compared to other surgical options such as laser vaporization(PVP/greenlight laser), transurethral resection of the prostate(TURP), and open simple prostatectomy.   What happens during a HoLEP?  HoLEP requires general anesthesia ("asleep" throughout the procedure).   An antibiotic is given to reduce the risk of infection  A surgical instrument called a resectoscope is inserted through the urethra (the tube that carries urine from the bladder). The resectoscope has a camera that allows the surgeon to view the internal structure of the prostate gland, and to see where the incisions are being made during surgery.  The laser is inserted into the resectoscope and is used to enucleate (free up) the enlarged prostate tissue from the capsule (outer shell) and then to seal up any blood vessels. The tissue that has been removed is pushed back into the bladder.  A morcellator is placed through the resectoscope, and is used to suction out the prostate tissue that has been pushed into the bladder.  When the prostate tissue has been removed, the resectoscope is removed, and a foley catheter is placed to allow healing and drain the urine from the bladder.      What happens after a HoLEP?  More than 90% of patients go home the same day a few hours after surgery. Less than 10% will be admitted to the hospital overnight for observation to monitor the urine, or if they have other medical problems.  Fluid is flushed through the catheter for about 1 hour after surgery to clear any blood from the urine. It is normal to have some blood in the urine after surgery. The need for blood transfusion is extremely rare.  Eating and drinking are permitted after the procedure once the patient has fully awakened from anesthesia.  The catheter is usually removed 2-3 days after surgery- the patient will come to clinic to have the catheter removed and make sure they can urinate on their own.  It is very important to drink lots of fluids after surgery for one week to keep the bladder flushed.  At first, there may be some burning with urination, but this typically improved within a few hours to days. Most patients do not have a significant amount of pain, and narcotic pain medications are rarely needed.  Symptoms of urinary frequency, urgency, and even leakage are NORMAL for the first few weeks after surgery as the bladder adjusts after having to work hard against blockage from the prostate for many years. This will improve, but can sometimes take several months.  The use of pelvic floor exercises (Kegel exercises) can help improve problems with urinary incontinence.   After catheter removal, patients will be seen at 6 weeks and 6 months for symptom check  No heavy lifting for   at least 2-3 weeks after surgery, however patients can walk and do light activities the first day after surgery. Return to work time depends on occupation.    What are the advantages of HoLEP?  HoLEP has been studied in many different parts of the world and has been shown to be a safe and effective procedure. Although there are many types of BPH surgeries available, HoLEP offers a unique  advantage in being able to remove a large amount of tissue without any incisions on the body, even in very large prostates, while decreasing the risk of bleeding and providing tissue for pathology (to look for cancer). This decreases the need for blood transfusions during surgery, minimizes hospital stay, and reduces the risk of needing repeat treatment.  What are the side effects of HoLEP?  Temporary burning and bleeding during urination. Some blood may be seen in the urine for weeks after surgery and is part of the healing process.  Urinary incontinence (inability to control urine flow) is expected in all patients immediately after surgery and they should wear pads for the first few days/weeks. This typically improves over the course of several weeks. Performing Kegel exercises can help decrease leakage from stress maneuvers such as coughing, sneezing, or lifting. The rate of long term leakage is very low. Patients may also have leakage with urgency and this may be treated with medication. The risk of urge incontinence can be dependent on several factors including age, prostate size, symptoms, and other medical problems.  Retrograde ejaculation or "backwards ejaculation." In 75% of cases, the patient will not see any fluid during ejaculation after surgery.  Erectile function is generally not significantly affected.   What are the risks of HoLEP?  Injury to the urethra or development of scar tissue at a later date  Injury to the capsule of the prostate (typically treated with longer catheterization).  Injury to the bladder or ureteral orifices (where the urine from the kidney drains out)  Infection of the bladder, testes, or kidneys  Return of urinary obstruction at a later date requiring another operation (<2%)  Need for blood transfusion or re-operation due to bleeding  Failure to relieve all symptoms and/or need for prolonged catheterization after surgery  5-15% of patients are found  to have previously undiagnosed prostate cancer in their specimen. Prostate cancer can be treated after HoLEP.  Standard risks of anesthesia including blood clots, heart attacks, etc  When should I call my doctor?  Fever over 101.3 degrees  Inability to urinate, or large blood clots in the urine   

## 2021-09-18 NOTE — Progress Notes (Signed)
   09/18/2021 3:00 PM   JATIN NAUMANN 04/30/1940 [id number]  Reason for visit: Follow up urinary retention, history of gross hematuria and recurrent UTIs  HPI: Extremely comorbid 81 year old male here with his wife today for follow-up of BPH with history of gross hematuria and recurrent UTIs as well as urinary retention.  He has previously no showed multiple follow-up visits for cystoscopy that were recommended for gross hematuria, and gross hematuria was felt to be secondary to BPH.  Prostate measures 200 g on most recent CT from January 2022.  He presented to the ER on 09/11/2021 with overflow incontinence and urinary dribbling with lower abdominal pain, and bladder scan was greater than 1 L.  He also had an AKI at that time with an elevated creatinine of 2.87 from baseline of 1.2.  Urinalysis was suspicious for infection, but culture ultimately showed no growth.  Nursing was unable to place a Foley and urology was consulted.  I was able to dilate a subtle fossa navicularis stricture with a 16 [place] Coloplast catheter, and passed a 20 [place] coud catheter into the bladder with return of amber urine.  He returned to the ER on 09/15/2021 with a nondraining Foley and it sounds like his Foley was changed out for a 14 French catheter which was traumatic and caused some bleeding.  The catheter was then upsized to a three-way and he was temporarily run on CBI by the ER staff, urology was not consulted at that time, and he was ultimately discharged.  His Foley was removed this morning, and he has been unable to urinate today, with bladder scan this afternoon measuring 500 mL.  We discussed options including a few more hours this afternoon to try to void, or replacing his Foley.  I discouraged him from leaving without a catheter today with his extremely high risk for ongoing retention, renal failure, and return to the ER in the next few days.  He was ultimately amenable to a Foley replacement.  I had a  frank conversation with the patient that he has had numerous episodes of gross hematuria, recurrent UTIs, and retention most likely secondary to BPH over the last 2 years, and I recommended considering an outlet procedure for definitive management of his urinary symptoms.  He was prepped and draped in standard sterile fashion and a 20 [place] coud Foley passed easily into the bladder with return of yellow urine, 10 mL were placed in the balloon.  We discussed the risks and benefits of HoLEP at length.  The procedure requires general anesthesia and takes 1 to 2 hours, and a holmium laser is used to enucleate the prostate and push this tissue into the bladder.  A morcellator is then used to remove this tissue, which is sent for pathology.  The vast majority(>95%) of patients are able to discharge the same day with a catheter in place for 2 to 3 days, and will follow-up in clinic for a voiding trial.  We specifically discussed the risks of bleeding, infection, retrograde ejaculation, temporary urgency and urge incontinence, very low risk of long-term incontinence, urethral stricture/bladder neck contracture, pathologic evaluation of prostate tissue and possible detection of prostate cancer or other malignancy, and possible need for additional procedures.  Schedule HOLEP, needs PCP clearance   Billey Co, MD  [facility] [address], [place], [place] [address] [phone]

## 2021-09-19 ENCOUNTER — Encounter: Payer: Self-pay | Admitting: Physician Assistant

## 2021-09-19 ENCOUNTER — Other Ambulatory Visit: Payer: Self-pay

## 2021-09-19 ENCOUNTER — Ambulatory Visit (INDEPENDENT_AMBULATORY_CARE_PROVIDER_SITE_OTHER): Payer: HMO | Admitting: Physician Assistant

## 2021-09-19 ENCOUNTER — Ambulatory Visit: Admission: RE | Admit: 2021-09-19 | Payer: HMO | Source: Ambulatory Visit

## 2021-09-19 ENCOUNTER — Inpatient Hospital Stay
Admission: EM | Admit: 2021-09-19 | Discharge: 2021-09-21 | DRG: 176 | Disposition: A | Discharge status: Home / Self Care | Payer: HMO | Attending: Internal Medicine | Admitting: Internal Medicine

## 2021-09-19 ENCOUNTER — Emergency Department: Payer: HMO

## 2021-09-19 ENCOUNTER — Inpatient Hospital Stay: Payer: HMO

## 2021-09-19 DIAGNOSIS — Z96653 Presence of artificial knee joint, bilateral: Secondary | ICD-10-CM | POA: Diagnosis present

## 2021-09-19 DIAGNOSIS — Z85038 Personal history of other malignant neoplasm of large intestine: Secondary | ICD-10-CM | POA: Diagnosis not present

## 2021-09-19 DIAGNOSIS — N390 Urinary tract infection, site not specified: Secondary | ICD-10-CM | POA: Diagnosis not present

## 2021-09-19 DIAGNOSIS — R911 Solitary pulmonary nodule: Secondary | ICD-10-CM | POA: Diagnosis not present

## 2021-09-19 DIAGNOSIS — S80212A Abrasion, left knee, initial encounter: Secondary | ICD-10-CM | POA: Diagnosis present

## 2021-09-19 DIAGNOSIS — I2699 Other pulmonary embolism without acute cor pulmonale: Secondary | ICD-10-CM | POA: Diagnosis present

## 2021-09-19 DIAGNOSIS — E785 Hyperlipidemia, unspecified: Secondary | ICD-10-CM | POA: Diagnosis present

## 2021-09-19 DIAGNOSIS — E1151 Type 2 diabetes mellitus with diabetic peripheral angiopathy without gangrene: Secondary | ICD-10-CM | POA: Diagnosis present

## 2021-09-19 DIAGNOSIS — W1830XA Fall on same level, unspecified, initial encounter: Secondary | ICD-10-CM | POA: Diagnosis present

## 2021-09-19 DIAGNOSIS — M25562 Pain in left knee: Secondary | ICD-10-CM | POA: Diagnosis present

## 2021-09-19 DIAGNOSIS — Z6838 Body mass index (BMI) 38.0-38.9, adult: Secondary | ICD-10-CM

## 2021-09-19 DIAGNOSIS — R350 Frequency of micturition: Secondary | ICD-10-CM | POA: Diagnosis not present

## 2021-09-19 DIAGNOSIS — N400 Enlarged prostate without lower urinary tract symptoms: Secondary | ICD-10-CM | POA: Diagnosis present

## 2021-09-19 DIAGNOSIS — S80211A Abrasion, right knee, initial encounter: Secondary | ICD-10-CM | POA: Diagnosis present

## 2021-09-19 DIAGNOSIS — G8929 Other chronic pain: Secondary | ICD-10-CM | POA: Diagnosis present

## 2021-09-19 DIAGNOSIS — I6782 Cerebral ischemia: Secondary | ICD-10-CM | POA: Diagnosis not present

## 2021-09-19 DIAGNOSIS — N401 Enlarged prostate with lower urinary tract symptoms: Secondary | ICD-10-CM | POA: Diagnosis present

## 2021-09-19 DIAGNOSIS — M79605 Pain in left leg: Secondary | ICD-10-CM | POA: Diagnosis not present

## 2021-09-19 DIAGNOSIS — I10 Essential (primary) hypertension: Secondary | ICD-10-CM | POA: Diagnosis present

## 2021-09-19 DIAGNOSIS — R338 Other retention of urine: Secondary | ICD-10-CM

## 2021-09-19 DIAGNOSIS — Z87891 Personal history of nicotine dependence: Secondary | ICD-10-CM

## 2021-09-19 DIAGNOSIS — G4733 Obstructive sleep apnea (adult) (pediatric): Secondary | ICD-10-CM | POA: Diagnosis present

## 2021-09-19 DIAGNOSIS — Z794 Long term (current) use of insulin: Secondary | ICD-10-CM

## 2021-09-19 DIAGNOSIS — Z833 Family history of diabetes mellitus: Secondary | ICD-10-CM

## 2021-09-19 DIAGNOSIS — T83511A Infection and inflammatory reaction due to indwelling urethral catheter, initial encounter: Secondary | ICD-10-CM | POA: Diagnosis not present

## 2021-09-19 DIAGNOSIS — S299XXA Unspecified injury of thorax, initial encounter: Secondary | ICD-10-CM | POA: Diagnosis not present

## 2021-09-19 DIAGNOSIS — E669 Obesity, unspecified: Secondary | ICD-10-CM | POA: Diagnosis present

## 2021-09-19 DIAGNOSIS — J439 Emphysema, unspecified: Secondary | ICD-10-CM | POA: Diagnosis present

## 2021-09-19 DIAGNOSIS — R0902 Hypoxemia: Secondary | ICD-10-CM

## 2021-09-19 DIAGNOSIS — I2609 Other pulmonary embolism with acute cor pulmonale: Secondary | ICD-10-CM | POA: Diagnosis not present

## 2021-09-19 DIAGNOSIS — I700 Atherosclerosis of aorta: Secondary | ICD-10-CM | POA: Diagnosis present

## 2021-09-19 DIAGNOSIS — R600 Localized edema: Secondary | ICD-10-CM | POA: Diagnosis not present

## 2021-09-19 DIAGNOSIS — J9811 Atelectasis: Secondary | ICD-10-CM | POA: Diagnosis not present

## 2021-09-19 DIAGNOSIS — I2510 Atherosclerotic heart disease of native coronary artery without angina pectoris: Secondary | ICD-10-CM | POA: Diagnosis present

## 2021-09-19 DIAGNOSIS — R9431 Abnormal electrocardiogram [ECG] [EKG]: Secondary | ICD-10-CM | POA: Diagnosis not present

## 2021-09-19 DIAGNOSIS — R55 Syncope and collapse: Secondary | ICD-10-CM | POA: Diagnosis not present

## 2021-09-19 DIAGNOSIS — N138 Other obstructive and reflux uropathy: Secondary | ICD-10-CM | POA: Diagnosis present

## 2021-09-19 DIAGNOSIS — Z820 Family history of epilepsy and other diseases of the nervous system: Secondary | ICD-10-CM

## 2021-09-19 DIAGNOSIS — I6529 Occlusion and stenosis of unspecified carotid artery: Secondary | ICD-10-CM | POA: Diagnosis not present

## 2021-09-19 DIAGNOSIS — Z888 Allergy status to other drugs, medicaments and biological substances status: Secondary | ICD-10-CM

## 2021-09-19 DIAGNOSIS — Z79899 Other long term (current) drug therapy: Secondary | ICD-10-CM

## 2021-09-19 DIAGNOSIS — Y92007 Garden or yard of unspecified non-institutional (private) residence as the place of occurrence of the external cause: Secondary | ICD-10-CM

## 2021-09-19 DIAGNOSIS — I2694 Multiple subsegmental pulmonary emboli without acute cor pulmonale: Principal | ICD-10-CM | POA: Diagnosis present

## 2021-09-19 DIAGNOSIS — G319 Degenerative disease of nervous system, unspecified: Secondary | ICD-10-CM | POA: Diagnosis not present

## 2021-09-19 DIAGNOSIS — N2889 Other specified disorders of kidney and ureter: Secondary | ICD-10-CM | POA: Diagnosis not present

## 2021-09-19 DIAGNOSIS — Z8673 Personal history of transient ischemic attack (TIA), and cerebral infarction without residual deficits: Secondary | ICD-10-CM

## 2021-09-19 DIAGNOSIS — Z7984 Long term (current) use of oral hypoglycemic drugs: Secondary | ICD-10-CM

## 2021-09-19 DIAGNOSIS — M25561 Pain in right knee: Secondary | ICD-10-CM | POA: Diagnosis present

## 2021-09-19 DIAGNOSIS — I252 Old myocardial infarction: Secondary | ICD-10-CM

## 2021-09-19 DIAGNOSIS — Z7982 Long term (current) use of aspirin: Secondary | ICD-10-CM

## 2021-09-19 DIAGNOSIS — Z66 Do not resuscitate: Secondary | ICD-10-CM | POA: Diagnosis present

## 2021-09-19 DIAGNOSIS — E538 Deficiency of other specified B group vitamins: Secondary | ICD-10-CM | POA: Diagnosis present

## 2021-09-19 DIAGNOSIS — E1140 Type 2 diabetes mellitus with diabetic neuropathy, unspecified: Secondary | ICD-10-CM | POA: Diagnosis present

## 2021-09-19 DIAGNOSIS — E119 Type 2 diabetes mellitus without complications: Secondary | ICD-10-CM | POA: Diagnosis not present

## 2021-09-19 DIAGNOSIS — Z951 Presence of aortocoronary bypass graft: Secondary | ICD-10-CM | POA: Diagnosis not present

## 2021-09-19 DIAGNOSIS — J449 Chronic obstructive pulmonary disease, unspecified: Secondary | ICD-10-CM | POA: Diagnosis not present

## 2021-09-19 DIAGNOSIS — I639 Cerebral infarction, unspecified: Secondary | ICD-10-CM | POA: Diagnosis not present

## 2021-09-19 HISTORY — DX: Other retention of urine: R33.8

## 2021-09-19 LAB — CBC WITH DIFFERENTIAL/PLATELET
Abs Immature Granulocytes: 0.07 10*3/uL (ref 0.00–0.07)
Basophils Absolute: 0.1 10*3/uL (ref 0.0–0.1)
Basophils Relative: 0 %
Eosinophils Absolute: 0.1 10*3/uL (ref 0.0–0.5)
Eosinophils Relative: 1 %
HCT: 36 % — ABNORMAL LOW (ref 39.0–52.0)
Hemoglobin: 12.2 g/dL — ABNORMAL LOW (ref 13.0–17.0)
Immature Granulocytes: 1 %
Lymphocytes Relative: 7 %
Lymphs Abs: 1 10*3/uL (ref 0.7–4.0)
MCH: 30.3 pg (ref 26.0–34.0)
MCHC: 33.9 g/dL (ref 30.0–36.0)
MCV: 89.3 fL (ref 80.0–100.0)
Monocytes Absolute: 1 10*3/uL (ref 0.1–1.0)
Monocytes Relative: 7 %
Neutro Abs: 11.4 10*3/uL — ABNORMAL HIGH (ref 1.7–7.7)
Neutrophils Relative %: 84 %
Platelets: 230 10*3/uL (ref 150–400)
RBC: 4.03 MIL/uL — ABNORMAL LOW (ref 4.22–5.81)
RDW: 12 % (ref 11.5–15.5)
WBC: 13.5 10*3/uL — ABNORMAL HIGH (ref 4.0–10.5)
nRBC: 0 % (ref 0.0–0.2)

## 2021-09-19 LAB — COMPREHENSIVE METABOLIC PANEL
ALT: 13 U/L (ref 0–44)
AST: 16 U/L (ref 15–41)
Albumin: 2.9 g/dL — ABNORMAL LOW (ref 3.5–5.0)
Alkaline Phosphatase: 56 U/L (ref 38–126)
Anion gap: 10 (ref 5–15)
BUN: 16 mg/dL (ref 8–23)
CO2: 24 mmol/L (ref 22–32)
Calcium: 9.3 mg/dL (ref 8.9–10.3)
Chloride: 104 mmol/L (ref 98–111)
Creatinine, Ser: 1.59 mg/dL — ABNORMAL HIGH (ref 0.61–1.24)
GFR, Estimated: 43 mL/min — ABNORMAL LOW (ref >60)
Glucose, Bld: 197 mg/dL — ABNORMAL HIGH (ref 70–99)
Potassium: 3.3 mmol/L — ABNORMAL LOW (ref 3.5–5.1)
Sodium: 138 mmol/L (ref 135–145)
Total Bilirubin: 1.1 mg/dL (ref 0.3–1.2)
Total Protein: 6.6 g/dL (ref 6.5–8.1)

## 2021-09-19 LAB — MICROSCOPIC EXAMINATION
RBC, Urine: >30 /hpf — AB (ref 0–2)
WBC, UA: >30 /hpf — AB (ref 0–5)

## 2021-09-19 LAB — URINALYSIS, COMPLETE
Bilirubin, UA: NEGATIVE
Ketones, UA: NEGATIVE
Nitrite, UA: POSITIVE — AB
Specific Gravity, UA: 1.015 (ref 1.005–1.030)
Urobilinogen, Ur: 0.2 mg/dL (ref 0.2–1.0)
pH, UA: 6 (ref 5.0–7.5)

## 2021-09-19 LAB — BLADDER SCAN AMB NON-IMAGING

## 2021-09-19 LAB — URINALYSIS, COMPLETE (UACMP) WITH MICROSCOPIC
Bilirubin Urine: NEGATIVE
Glucose, UA: 50 mg/dL — AB
Ketones, ur: 5 mg/dL — AB
Nitrite: NEGATIVE
Protein, ur: >=300 mg/dL — AB
RBC / HPF: >50 RBC/hpf — ABNORMAL HIGH (ref 0–5)
Specific Gravity, Urine: 1.011 (ref 1.005–1.030)
Squamous Epithelial / HPF: NONE SEEN (ref 0–5)
WBC, UA: >50 WBC/hpf — ABNORMAL HIGH (ref 0–5)
pH: 6 (ref 5.0–8.0)

## 2021-09-19 LAB — CK: Total CK: 114 U/L (ref 49–397)

## 2021-09-19 LAB — APTT: aPTT: 29 seconds (ref 24–36)

## 2021-09-19 LAB — PROTIME-INR
INR: 1.3 — ABNORMAL HIGH (ref 0.8–1.2)
Prothrombin Time: 15.6 seconds — ABNORMAL HIGH (ref 11.4–15.2)

## 2021-09-19 LAB — LACTIC ACID, PLASMA
Lactic Acid, Venous: 1.2 mmol/L (ref 0.5–1.9)
Lactic Acid, Venous: 0.9 mmol/L (ref 0.5–1.9)

## 2021-09-19 LAB — TROPONIN I (HIGH SENSITIVITY)
Troponin I (High Sensitivity): 42 ng/L — ABNORMAL HIGH (ref <18)
Troponin I (High Sensitivity): 47 ng/L — ABNORMAL HIGH (ref <18)

## 2021-09-19 LAB — MAGNESIUM: Magnesium: 1.4 mg/dL — ABNORMAL LOW (ref 1.7–2.4)

## 2021-09-19 LAB — CBG MONITORING, ED: Glucose-Capillary: 132 mg/dL — ABNORMAL HIGH (ref 70–99)

## 2021-09-19 LAB — PROCALCITONIN: Procalcitonin: <0.1 ng/mL

## 2021-09-19 MED ORDER — CLONIDINE HCL 0.1 MG PO TABS
0.1000 mg | ORAL_TABLET | Freq: Two times a day (BID) | ORAL | Status: DC
  Administered 2021-09-19 – 2021-09-21 (×4): 0.1 mg via ORAL
  Filled 2021-09-19 (×4): qty 1

## 2021-09-19 MED ORDER — VITAMIN D 25 MCG (1000 UNIT) PO TABS
2000.0000 ug | ORAL_TABLET | Freq: Every day | ORAL | Status: DC
  Administered 2021-09-19: 25 ug via ORAL
  Filled 2021-09-19 (×3): qty 1

## 2021-09-19 MED ORDER — ACETAMINOPHEN 325 MG RE SUPP: 650.0000 mg | Freq: Once | RECTAL | Status: DC

## 2021-09-19 MED ORDER — ACETAMINOPHEN 650 MG RE SUPP: 650.0000 mg | Freq: Four times a day (QID) | RECTAL | Status: DC | PRN

## 2021-09-19 MED ORDER — IOHEXOL 350 MG/ML SOLN
75.0000 mL | Freq: Once | INTRAVENOUS | Status: AC | PRN
  Administered 2021-09-19: 75 mL via INTRAVENOUS

## 2021-09-19 MED ORDER — ONDANSETRON HCL 4 MG PO TABS: 4.0000 mg | ORAL_TABLET | Freq: Four times a day (QID) | ORAL | Status: DC | PRN

## 2021-09-19 MED ORDER — HEPARIN (PORCINE) 25000 UT/250ML-% IV SOLN
1900.0000 [IU]/h | INTRAVENOUS | Status: AC
  Administered 2021-09-19 – 2021-09-20 (×2): 1700 [IU]/h via INTRAVENOUS
  Filled 2021-09-19: qty 250

## 2021-09-19 MED ORDER — ACETAMINOPHEN 500 MG PO TABS
1000.0000 mg | ORAL_TABLET | Freq: Once | ORAL | Status: AC
  Administered 2021-09-19: 1000 mg via ORAL
  Filled 2021-09-19: qty 2

## 2021-09-19 MED ORDER — HEPARIN BOLUS VIA INFUSION
6000.0000 [IU] | Freq: Once | INTRAVENOUS | Status: AC
  Administered 2021-09-19: 6000 [IU] via INTRAVENOUS
  Filled 2021-09-19: qty 6000

## 2021-09-19 MED ORDER — ACETAMINOPHEN 325 MG PO TABS
650.0000 mg | ORAL_TABLET | Freq: Four times a day (QID) | ORAL | Status: DC | PRN
  Administered 2021-09-19: 650 mg via ORAL
  Filled 2021-09-19: qty 2

## 2021-09-19 MED ORDER — CARVEDILOL 25 MG PO TABS
25.0000 mg | ORAL_TABLET | Freq: Two times a day (BID) | ORAL | Status: DC
  Administered 2021-09-20 – 2021-09-21 (×3): 25 mg via ORAL
  Filled 2021-09-19 (×3): qty 1

## 2021-09-19 MED ORDER — VITAMIN B-12 1000 MCG PO TABS
2000.0000 ug | ORAL_TABLET | Freq: Every day | ORAL | Status: DC
  Administered 2021-09-19 – 2021-09-20 (×2): 2000 ug via ORAL
  Filled 2021-09-19 (×2): qty 2

## 2021-09-19 MED ORDER — TAMSULOSIN HCL 0.4 MG PO CAPS
0.4000 mg | ORAL_CAPSULE | Freq: Every day | ORAL | Status: DC
  Administered 2021-09-19 – 2021-09-20 (×2): 0.4 mg via ORAL
  Filled 2021-09-19 (×2): qty 1

## 2021-09-19 MED ORDER — ONDANSETRON HCL 4 MG/2ML IJ SOLN: 4.0000 mg | Freq: Four times a day (QID) | INTRAMUSCULAR | Status: DC | PRN

## 2021-09-19 MED ORDER — INSULIN ASPART 100 UNIT/ML IJ SOLN
0.0000 [IU] | Freq: Three times a day (TID) | INTRAMUSCULAR | Status: DC
  Administered 2021-09-19: 2 [IU] via SUBCUTANEOUS
  Administered 2021-09-20: 3 [IU] via SUBCUTANEOUS
  Administered 2021-09-20 – 2021-09-21 (×3): 2 [IU] via SUBCUTANEOUS
  Administered 2021-09-21: 3 [IU] via SUBCUTANEOUS
  Filled 2021-09-19 (×6): qty 1

## 2021-09-19 MED ORDER — HEPARIN SODIUM (PORCINE) 5000 UNIT/ML IJ SOLN: 4000.0000 [IU] | Freq: Once | INTRAMUSCULAR | Status: DC

## 2021-09-19 MED ORDER — TRAZODONE HCL 50 MG PO TABS: 25.0000 mg | ORAL_TABLET | Freq: Every evening | ORAL | Status: DC | PRN

## 2021-09-19 MED ORDER — LOSARTAN POTASSIUM 50 MG PO TABS
100.0000 mg | ORAL_TABLET | Freq: Every day | ORAL | Status: DC
  Administered 2021-09-20 – 2021-09-21 (×2): 100 mg via ORAL
  Filled 2021-09-19 (×2): qty 2

## 2021-09-19 MED ORDER — TORSEMIDE 20 MG PO TABS
80.0000 mg | ORAL_TABLET | Freq: Two times a day (BID) | ORAL | Status: DC
  Administered 2021-09-19 – 2021-09-21 (×4): 80 mg via ORAL
  Filled 2021-09-19 (×4): qty 4

## 2021-09-19 MED ORDER — INSULIN GLARGINE-YFGN 100 UNIT/ML ~~LOC~~ SOLN
10.0000 [IU] | SUBCUTANEOUS | Status: DC
Start: 2021-09-19 — End: 2021-09-21
  Administered 2021-09-19 – 2021-09-20 (×2): 10 [IU] via SUBCUTANEOUS
  Filled 2021-09-19 (×3): qty 0.1

## 2021-09-19 MED ORDER — ASPIRIN 81 MG PO TBEC
81.0000 mg | DELAYED_RELEASE_TABLET | Freq: Every day | ORAL | Status: DC
  Administered 2021-09-20 – 2021-09-21 (×2): 81 mg via ORAL
  Filled 2021-09-19 (×2): qty 1

## 2021-09-19 MED ORDER — LACTATED RINGERS IV BOLUS
1000.0000 mL | Freq: Once | INTRAVENOUS | Status: AC
  Administered 2021-09-19: 1000 mL via INTRAVENOUS

## 2021-09-19 MED ORDER — POTASSIUM CHLORIDE IN NACL 20-0.9 MEQ/L-% IV SOLN
INTRAVENOUS | Status: DC
  Filled 2021-09-19 (×4): qty 1000

## 2021-09-19 MED ORDER — ROSUVASTATIN CALCIUM 10 MG PO TABS
40.0000 mg | ORAL_TABLET | Freq: Every day | ORAL | Status: DC
  Administered 2021-09-19 – 2021-09-20 (×2): 40 mg via ORAL
  Filled 2021-09-19: qty 4
  Filled 2021-09-19: qty 2

## 2021-09-19 MED ORDER — HYDRALAZINE HCL 50 MG PO TABS
50.0000 mg | ORAL_TABLET | Freq: Three times a day (TID) | ORAL | Status: DC
  Administered 2021-09-19 – 2021-09-21 (×5): 50 mg via ORAL
  Filled 2021-09-19 (×5): qty 1

## 2021-09-19 MED ORDER — MAGNESIUM HYDROXIDE 400 MG/5ML PO SUSP
30.0000 mL | Freq: Every day | ORAL | Status: DC | PRN
Start: 2021-09-19 — End: 2021-09-21

## 2021-09-19 MED ORDER — MAGNESIUM OXIDE -MG SUPPLEMENT 400 (240 MG) MG PO TABS
400.0000 mg | ORAL_TABLET | Freq: Every day | ORAL | Status: DC
  Administered 2021-09-19 – 2021-09-21 (×3): 400 mg via ORAL
  Filled 2021-09-19 (×3): qty 1

## 2021-09-19 MED ORDER — TIOTROPIUM BROMIDE MONOHYDRATE 18 MCG IN CAPS
1.0000 | ORAL_CAPSULE | Freq: Every day | RESPIRATORY_TRACT | Status: DC
  Filled 2021-09-19: qty 5

## 2021-09-19 MED ORDER — FENOFIBRATE 160 MG PO TABS
160.0000 mg | ORAL_TABLET | Freq: Every day | ORAL | Status: DC
  Administered 2021-09-21: 160 mg via ORAL
  Filled 2021-09-19 (×2): qty 1

## 2021-09-19 NOTE — ED Notes (Signed)
Pt's skinned knees have been covered with non-adherent bandages.

## 2021-09-19 NOTE — Assessment & Plan Note (Signed)
-   We will continue statin therapy. 

## 2021-09-19 NOTE — Assessment & Plan Note (Signed)
-   We will place him on supplemental coverage with NovoLog. - We will hold off metformin. - We will continue his basal coverage.

## 2021-09-19 NOTE — Assessment & Plan Note (Signed)
Stable.  No need for intervention by vascular surgery.  Briefly hypoxic but no longer.  Change heparin over to Eliquis.  Echocardiogram unremarkable.

## 2021-09-19 NOTE — Progress Notes (Signed)
Patient presented to clinic today as an urgent add-on with reports of nondraining Foley catheter x12 hours.  Foley catheter was placed in clinic by Dr. Diamantina Providence yesterday and catheter initially was draining before stopping last night.  He reports some lower abdominal pain this morning.  Bladder scan with 826 mL.  Foley catheter is in place draining pink-tinged, clear urine. Patient declined bladder irrigation teaching today. Pain resolved with bladder drainage.  Bladder Irrigation  Due to nondraining Foley catheter patient is present today for a bladder irrigation. Patient was cleaned and prepped in a clean fashion. 600 ml of sterile water was instilled and irrigated into the bladder with a 40m Toomey syringe through the catheter in place.  Efflux remained clear to light pink-tinged in color and 20ccs of clot fragments and urinary debris/sediment/purulence was evacuated from the bladder. Catheter is now draining fine.  Catheter was reattached to the leg bag for drainage. Patient tolerated well.   Performed by: SDebroah Loop PA-C and DEdwin Dada CMA  Additional notes/ Follow up: As previously planned by Dr. SDiamantina Providence

## 2021-09-19 NOTE — ED Notes (Signed)
Pt back from ct, pt c/o R knee pain, pt has abrasions to bilateral knees. Pt awake and oriented.

## 2021-09-19 NOTE — Assessment & Plan Note (Signed)
-   We will continue his antihypertensive while monitoring his BP. Please note that patient reportedly has a previous history of diastolic heart failure however every echocardiogram including 1 done on this hospitalization note normal ejection fraction and no evidence of diastolic dysfunction.  Patient does not have CHF.

## 2021-09-19 NOTE — Assessment & Plan Note (Signed)
-   We will continue his inhalers while holding off long-acting beta agonist.

## 2021-09-19 NOTE — H&P (Addendum)
[place]   PATIENT NAME: Justin Griffith    MR#:  [id number]  DATE OF BIRTH:  1940-11-25  DATE OF ADMISSION:  09/19/2021  PRIMARY CARE PHYSICIAN: Charlynne Cousins, MD   Patient is coming from: Home  REQUESTING/REFERRING PHYSICIAN: Vladimir Crofts, MD  CHIEF COMPLAINT:   Chief Complaint  Patient presents with   Weakness    HISTORY OF PRESENT ILLNESS:  Justin Griffith is a 81 y.o. male with medical history significant for BPH, status post HoLEP procedure yesterday by Dr. Diamantina Providence, diastolic CHF, CAD status post CABG, OSA, DVT, COPD, dyslipidemia and hypertension, presented to the emergency room with acute onset of generalized weakness and suspected syncope.  The patient was seen in the follow-up urology appointment today and his Foley was irrigated with some blood clots and sediments were evacuated and his catheter was well draining.  He was found down in his front yard on the ground and was last seen around lunchtime.  He was febrile and confused with EMS.  He denied any chest pain or dyspnea or cough or hemoptysis.  No fever or chills.  He denies any worsening lower extremity edema or pain.  He was noted to have bilateral knee abrasions.  He was initially unable to provide any history but currently is alert oriented x3 during my interview.  No other bleeding diathesis.  ED Course: When he came to the ER, BP was 147/73 with pulse oximetry of 86% on room air and 92% on 3 L of O2 by nasal cannula and heart rate was 104 with temperature 102.3 that came down after 1 g of p.o. Tylenol to 99.4 and pulse symmetry was 96% on 2 L of O2 by nasal cannula. EKG as reviewed by me : Sinus tachycardia with rate 1082 with Q waves anteroseptally Imaging: Portable chest ray showed no acute cardiopulmonary disease.  Chest, abdomen and pelvic CTA revealed the following: 1. Positive for pulmonary emboli. There are multiple bilateral segmental pulmonary emboli. Positive for acute PE with CT evidence of right  heart strain (RV/LV Ratio = 1.15) consistent with at least submassive (intermediate risk) PE. The presence of right heart strain has been associated with an increased risk of morbidity and mortality. Please refer to the "Code PE Focused" order set in EPIC. 2. Lungs show evidence of vascular shunting and atelectasis, but no evidence of an infarct, pleural effusion or pneumothorax. 3. Mild enlargement of the main pulmonary artery tp 3.9 cm.   CT ABDOMEN AND PELVIS   1. No acute findings within the abdomen or pelvis.  The patient was given 1 L bolus of IV lactated Ringer, 1 g of p.o. Tylenol as mentioned above and was started on IV heparin with bolus and drip.  Dr. Gigi Gin with vascular surgery was consulted and reviewed the patient's CTA.  It was not thought that the patient will need any intervention.  He will be admitted to a progressive unit bed for further evaluation and management. PAST MEDICAL HISTORY:   Past Medical History:  Diagnosis Date   Acute urinary retention    Arthritis    Asthma 2000   Back pain    Colon polyp 2012   COPD (chronic obstructive pulmonary disease) (HCC)    Emphysema of lung (HCC)    Heart murmur    Hernia 2000   Hyperlipidemia    Hypertension    Personal history of malignant neoplasm of large intestine    Personal history of tobacco use, presenting hazards  to health    Sleep apnea    Special screening for malignant neoplasms, colon    Type 2 diabetes mellitus with proteinuria ([place]) 10/11/2014    PAST SURGICAL HISTORY:   Past Surgical History:  Procedure Laterality Date   CARDIAC CATHETERIZATION Left 09/25/2015   Procedure: Left Heart Cath and Coronary Angiography;  Surgeon: Yolonda Kida, MD;  Location: [facility];  Service: Cardiovascular;  Laterality: Left;   CHOLECYSTECTOMY  1970   COLON SURGERY  07-16-99   sigmoid colon resection with primary anastomosis, chemotherapy for metastatic disease   COLONOSCOPY  2001, 2012   Dr  Bary Castilla, tubular adenoma of the cecum and ascending colon in 2012.   COLONOSCOPY WITH PROPOFOL N/A 02/12/2016   Procedure: COLONOSCOPY WITH PROPOFOL;  Surgeon: Robert Bellow, MD;  Location: [facility] ENDOSCOPY;  Service: Endoscopy;  Laterality: N/A;   CORONARY STENT INTERVENTION N/A 05/17/2020   Procedure: CORONARY STENT INTERVENTION;  Surgeon: Yolonda Kida, MD;  Location: [facility];  Service: Cardiovascular;  Laterality: N/A;  LAD   ESOPHAGOGASTRODUODENOSCOPY (EGD) WITH PROPOFOL N/A 02/12/2016   Procedure: ESOPHAGOGASTRODUODENOSCOPY (EGD) WITH PROPOFOL;  Surgeon: Robert Bellow, MD;  Location: [facility] ENDOSCOPY;  Service: Endoscopy;  Laterality: N/A;   HERNIA REPAIR Right    right inguinal hernia repair   JOINT REPLACEMENT Bilateral    knee replacement   KNEE SURGERY Bilateral 2010   LEFT HEART CATH AND CORONARY ANGIOGRAPHY N/A 05/17/2020   Procedure: LEFT HEART CATH AND CORONARY ANGIOGRAPHY possible PCI and stent;  Surgeon: Yolonda Kida, MD;  Location: [facility];  Service: Cardiovascular;  Laterality: N/A;   MYRINGOTOMY WITH TUBE PLACEMENT Bilateral 08/16/2014   Procedure: MYRINGOTOMY WITH TUBE PLACEMENT;  Surgeon: Carloyn Manner, MD;  Location: [facility];  Service: ENT;  Laterality: Bilateral;   PERIPHERAL VASCULAR CATHETERIZATION Right 09/04/2015   Procedure: Lower Extremity Angiography;  Surgeon: Katha Cabal, MD;  Location: [place] [facility];  Service: Cardiovascular;  Laterality: Right;   PERIPHERAL VASCULAR CATHETERIZATION  09/04/2015   Procedure: Lower Extremity Intervention;  Surgeon: Katha Cabal, MD;  Location: [facility];  Service: Cardiovascular;;   TONSILLECTOMY     TYMPANOSTOMY TUBE PLACEMENT      SOCIAL HISTORY:   Social History   Tobacco Use   Smoking status: Former    Packs/day: 1.00    Years: 30.00    Total pack years: 30.00    Types: Cigarettes    Quit date: 03/23/1989    Years since quitting: 32.5    Passive  exposure: Past   Smokeless tobacco: Never  Substance Use Topics   Alcohol use: No    Alcohol/week: 0.0 standard drinks of alcohol    FAMILY HISTORY:   Family History  Problem Relation Age of Onset   Diabetes Father    Esophageal cancer Mother    Alzheimer's disease Paternal Uncle     DRUG ALLERGIES:   Allergies  Allergen Reactions   Farxiga [Dapagliflozin] Rash    Causes a severe rash in groin area    Dulaglutide Diarrhea and Other (See Comments)   Liraglutide Diarrhea and Other (See Comments)   Jardiance [Empagliflozin] Rash    REVIEW OF SYSTEMS:   ROS As per history of present illness. All pertinent systems were reviewed above. Constitutional, HEENT, cardiovascular, respiratory, GI, GU, musculoskeletal, neuro, psychiatric, endocrine, integumentary and hematologic systems were reviewed and are otherwise negative/unremarkable except for positive findings mentioned above in the HPI.   MEDICATIONS AT  HOME:   Prior to Admission medications   Medication Sig Start Date End Date Taking? Authorizing Provider  acyclovir (ZOVIRAX) 400 MG tablet Take 400 mg by mouth 2 (two) times daily.    [provider]  aspirin EC 81 MG EC tablet Take 1 tablet (81 mg total) by mouth daily. Swallow whole. 05/22/20   Sharen Hones, MD  BREZTRI AEROSPHERE 160-9-4.8 MCG/ACT AERO Inhale into the lungs. 08/19/21   [provider]  carvedilol (COREG) 25 MG tablet TAKE ONE TABLET BY MOUTH TWO TIMES A DAY FOR BLOOD PRESSURE 12/16/20   [provider]  cloNIDine (CATAPRES) 0.1 MG tablet Take 0.1 mg by mouth 2 (two) times daily. 12/16/20   [provider]  fenofibrate (TRICOR) 145 MG tablet Take 1 tablet (145 mg total) by mouth daily. 09/11/21   Vigg, Avanti, MD  hydrALAZINE (APRESOLINE) 50 MG tablet TAKE ONE TABLET BY MOUTH THREE TIMES A DAY FOR BLOOD PRESSURE 12/16/20   [provider]  insulin glargine-yfgn (SEMGLEE) 100 UNIT/ML Pen INJECT 10 UNITS UNDER SKIN ONCE  EVERY DAY FOR DIABETES DISCARD PEN 28 DAYS AFTER OPENING. * GIVE AT THE SAME TIME DAILY * 08/04/21   [provider]  losartan (COZAAR) 100 MG tablet Take 100 mg by mouth daily. 09/25/20   [provider]  magnesium oxide (MAG-OX) 400 (240 Mg) MG tablet Take 1 tablet (400 mg total) by mouth daily. 06/27/21   Lorella Nimrod, MD  metFORMIN (GLUCOPHAGE) 1000 MG tablet Take 1 tablet (1,000 mg total) by mouth 2 (two) times daily with a meal. 10/16/20 10/16/21  Vigg, Avanti, MD  nortriptyline (PAMELOR) 10 MG capsule Take 10 mg by mouth 2 (two) times daily. 09/01/21   [provider]  rosuvastatin (CRESTOR) 40 MG tablet Take 1 tablet (40 mg total) by mouth at bedtime. 09/11/21   Vigg, Avanti, MD  tamsulosin (FLOMAX) 0.4 MG CAPS capsule Take 1 capsule (0.4 mg total) by mouth daily after supper. 09/11/21 11/10/21  Blake Divine, MD  Tiotropium Bromide Monohydrate (SPIRIVA RESPIMAT) 2.5 MCG/ACT AERS Inhale 2 puffs into the lungs daily. 05/15/20   Vigg, Avanti, MD  Torsemide 40 MG TABS Take 80 mg by mouth 2 (two) times daily. 04/14/21 04/14/22  [provider]  vitamin B-12 (CYANOCOBALAMIN) 500 MCG tablet Take 2,000 mcg by mouth at bedtime.    [provider]  Vitamin D, Cholecalciferol, 25 MCG (1000 UT) CAPS Take 2,000 mcg by mouth at bedtime.    [provider]      VITAL SIGNS:  Blood pressure (!) 166/77, pulse 76, temperature 99.4 F (37.4 C), temperature source Oral, resp. rate 19, height '5\' 10"'$  (1.778 m), weight 120.7 kg, SpO2 100 %.  PHYSICAL EXAMINATION:  Physical Exam  GENERAL:  81 y.o.-year-old Caucasian male patient lying in the bed with no acute distress.  EYES: Pupils equal, round, reactive to light and accommodation. No scleral icterus. Extraocular muscles intact.  HEENT: Head atraumatic, normocephalic. Oropharynx and nasopharynx clear.  NECK:  Supple, no jugular venous distention. No thyroid enlargement, no tenderness.  LUNGS: Normal breath sounds  bilaterally, no wheezing, rales,rhonchi or crepitation. No use of accessory muscles of respiration.  CARDIOVASCULAR: Regular rate and rhythm, S1, S2 normal.  2/6 systolic ejection murmur at the left lower sternal border with no rubs, or gallops.  ABDOMEN: Soft, nondistended, nontender. Bowel sounds present. No organomegaly or mass.  EXTREMITIES: 1+ bilateral lower extremity pitting edema with no cyanosis, or clubbing.  Negative Homans' sign. NEUROLOGIC: Cranial nerves II  through XII are intact. Muscle strength 5/5 in all extremities. Sensation intact. Gait not checked.  PSYCHIATRIC: The patient is alert and oriented x 3.  Normal affect and good eye contact. SKIN: He has bilateral knee abrasions as well as mild right mid leg abrasions. LABORATORY PANEL:   CBC Recent Labs  Lab 09/19/21 1650  WBC 13.5*  HGB 12.2*  HCT 36.0*  PLT 230   ------------------------------------------------------------------------------------------------------------------  Chemistries  Recent Labs  Lab 09/19/21 1650  NA 138  K 3.3*  CL 104  CO2 24  GLUCOSE 197*  BUN 16  CREATININE 1.59*  CALCIUM 9.3  MG 1.4*  AST 16  ALT 13  ALKPHOS 56  BILITOT 1.1   ------------------------------------------------------------------------------------------------------------------  Cardiac Enzymes No results for input(s): "TROPONINI" in the last 168 hours. ------------------------------------------------------------------------------------------------------------------  RADIOLOGY:  US Venous Img Lower Bilateral (DVT)  Result Date: 09/19/2021 CLINICAL DATA:  Left lower extremity pain and edema. EXAM: BILATERAL LOWER EXTREMITY VENOUS DOPPLER ULTRASOUND TECHNIQUE: Gray-scale sonography with graded compression, as well as color Doppler and duplex ultrasound were performed to evaluate the lower extremity deep venous systems from the level of the common femoral vein and including the common femoral, femoral, profunda  femoral, popliteal and calf veins including the posterior tibial, peroneal and gastrocnemius veins when visible. The superficial great saphenous vein was also interrogated. Spectral Doppler was utilized to evaluate flow at rest and with distal augmentation maneuvers in the common femoral, femoral and popliteal veins. COMPARISON:  December 15, 2019 FINDINGS: RIGHT LOWER EXTREMITY Common Femoral Vein: No evidence of thrombus. Normal compressibility, respiratory phasicity and response to augmentation. Saphenofemoral Junction: No evidence of thrombus. Normal compressibility and flow on color Doppler imaging. Profunda Femoral Vein: No evidence of thrombus. Normal compressibility and flow on color Doppler imaging. Femoral Vein: No evidence of thrombus. Normal compressibility, respiratory phasicity and response to augmentation. Popliteal Vein: No evidence of thrombus. Normal compressibility, respiratory phasicity and response to augmentation. Calf Veins: No evidence of thrombus (only 1 peroneal vein is visualized on the RIGHT, as per the ultrasound technologist. Normal compressibility and flow on color Doppler imaging. Superficial Great Saphenous Vein: No evidence of thrombus. Normal compressibility. Venous Reflux:  None. Other Findings:  None. LEFT LOWER EXTREMITY Common Femoral Vein: No evidence of thrombus. Normal compressibility, respiratory phasicity and response to augmentation. Saphenofemoral Junction: No evidence of thrombus. Normal compressibility and flow on color Doppler imaging. Profunda Femoral Vein: No evidence of thrombus. Normal compressibility and flow on color Doppler imaging. Femoral Vein: No evidence of thrombus. Normal compressibility, respiratory phasicity and response to augmentation. Popliteal Vein: No evidence of thrombus. Normal compressibility, respiratory phasicity and response to augmentation. Calf Veins: No evidence of thrombus. Normal compressibility and flow on color Doppler imaging.  Superficial Great Saphenous Vein: No evidence of thrombus. Normal compressibility. Venous Reflux:  None. Other Findings:  None. IMPRESSION: No evidence of deep venous thrombosis in either lower extremity. Electronically Signed   By: Virgina Norfolk M.D.   On: 09/19/2021 21:37   CT Angio Chest PE W and/or Wo Contrast  Result Date: 09/19/2021 CLINICAL DATA:  per ems pt was found down in his driveway, states that he has probably been down since noon, pt has abrasions on bilat knees and a red rash on his abd. Pt arouses to verbal stim, pt states that he feel generally poorly, pt oriented to person, place and day of the week. EXAM: CT ANGIOGRAPHY CHEST CT ABDOMEN AND PELVIS WITH CONTRAST TECHNIQUE: Multidetector CT imaging of the chest was performed  using the standard protocol during bolus administration of intravenous contrast. Multiplanar CT image reconstructions and MIPs were obtained to evaluate the vascular anatomy. Multidetector CT imaging of the abdomen and pelvis was performed using the standard protocol during bolus administration of intravenous contrast. RADIATION DOSE REDUCTION: This exam was performed according to the departmental dose-optimization program which includes automated exposure control, adjustment of the mA and/or kV according to patient size and/or use of iterative reconstruction technique. CONTRAST:  49m OMNIPAQUE IOHEXOL 350 MG/ML SOLN COMPARISON:  03/29/2020, CT abdomen pelvis. Current chest radiograph. FINDINGS: CTA CHEST FINDINGS Cardiovascular: There are multiple bilateral pulmonary emboli. On the right, pulmonary emboli are noted extending from the upper lobe pulmonary artery into multiple segmental branches. They appear nonocclusive. There are segmental branch pulmonary emboli to the medial and lateral segments of the right middle lobe. Segmental pulmonary emboli are noted to all segments of the right lower lobe. On the left a nonocclusive embolus straddles the peripheral main  pulmonary artery into the left upper lobe anterior and apical segmental branches and left upper lobe lingular branch. There are nonocclusive pulmonary emboli to the lower lobe segmental branches. No large central pulmonary emboli, however. Pulmonary artery is dilated to 3.9 cm. RV LV ratio is 1.15, mildly elevated. Heart is top-normal in size. Three-vessel coronary artery calcifications. No pericardial effusion. Mild aortic atherosclerosis. No aneurysm or dissection. Mediastinum/Nodes: No neck base, mediastinal or hilar masses or enlarged lymph nodes. Trachea and esophagus are unremarkable. Lungs/Pleura: Subtle ground-glass opacities, most evident in the left upper lobe, likely due to vascular shunting. Linear atelectasis noted in the dependent lower lobes and lung bases. Two small pulmonary nodules, right lower lobe, image 103, series 3, 6 mm, and right upper lobe, image 78, series 3, 4 mm. No pleural effusion.  No pneumothorax. Musculoskeletal: No fracture or acute finding. No suspicious bone lesion. Review of the MIP images confirms the above findings. CT ABDOMEN and PELVIS FINDINGS Hepatobiliary: Decreased liver attenuation consistent with fatty infiltration. No liver mass or focal lesion. Prior cholecystectomy. No bile duct dilation. Pancreas: Unremarkable. No pancreatic ductal dilatation or surrounding inflammatory changes. Spleen: Normal in size without focal abnormality. Adrenals/Urinary Tract: No adrenal mass. Kidneys normal in overall size, orientation and position. No renal mass, stone or hydronephrosis. Chronic bilateral perinephric stranding. Normal ureters. Bladder mostly decompressed. Wall is prominent. Stomach/Bowel: Normal stomach. Small bowel and colon are normal in caliber. No wall thickening. No inflammation. Sigmoid colon anastomosis staple line. Vascular/Lymphatic: Aortic atherosclerosis. No aneurysm. No enlarged lymph nodes. Reproductive: Enlarged prostate measuring 7.6 x 6.4 x 7.6 cm. Other:  No abdominal wall hernia or abnormality. No abdominopelvic ascites. Musculoskeletal: No fracture or acute finding. No suspicious bone lesion. Review of the MIP images confirms the above findings. IMPRESSION: CTA CHEST 1. Positive for pulmonary emboli. There are multiple bilateral segmental pulmonary emboli. Positive for acute PE with CT evidence of right heart strain (RV/LV Ratio = 1.15) consistent with at least submassive (intermediate risk) PE. The presence of right heart strain has been associated with an increased risk of morbidity and mortality. Please refer to the "Code PE Focused" order set in EPIC. 2. Lungs show evidence of vascular shunting and atelectasis, but no evidence of an infarct, pleural effusion or pneumothorax. 3. Mild enlargement of the main pulmonary artery tp 3.9 cm. CT ABDOMEN AND PELVIS 1. No acute findings within the abdomen or pelvis. Electronically Signed   By: DLajean ManesM.D.   On: 09/19/2021 18:39   CT ABDOMEN PELVIS W CONTRAST  Result Date: 09/19/2021 CLINICAL DATA:  per ems pt was found down in his driveway, states that he has probably been down since noon, pt has abrasions on bilat knees and a red rash on his abd. Pt arouses to verbal stim, pt states that he feel generally poorly, pt oriented to person, place and day of the week. EXAM: CT ANGIOGRAPHY CHEST CT ABDOMEN AND PELVIS WITH CONTRAST TECHNIQUE: Multidetector CT imaging of the chest was performed using the standard protocol during bolus administration of intravenous contrast. Multiplanar CT image reconstructions and MIPs were obtained to evaluate the vascular anatomy. Multidetector CT imaging of the abdomen and pelvis was performed using the standard protocol during bolus administration of intravenous contrast. RADIATION DOSE REDUCTION: This exam was performed according to the departmental dose-optimization program which includes automated exposure control, adjustment of the mA and/or kV according to patient size and/or  use of iterative reconstruction technique. CONTRAST:  18m OMNIPAQUE IOHEXOL 350 MG/ML SOLN COMPARISON:  03/29/2020, CT abdomen pelvis. Current chest radiograph. FINDINGS: CTA CHEST FINDINGS Cardiovascular: There are multiple bilateral pulmonary emboli. On the right, pulmonary emboli are noted extending from the upper lobe pulmonary artery into multiple segmental branches. They appear nonocclusive. There are segmental branch pulmonary emboli to the medial and lateral segments of the right middle lobe. Segmental pulmonary emboli are noted to all segments of the right lower lobe. On the left a nonocclusive embolus straddles the peripheral main pulmonary artery into the left upper lobe anterior and apical segmental branches and left upper lobe lingular branch. There are nonocclusive pulmonary emboli to the lower lobe segmental branches. No large central pulmonary emboli, however. Pulmonary artery is dilated to 3.9 cm. RV LV ratio is 1.15, mildly elevated. Heart is top-normal in size. Three-vessel coronary artery calcifications. No pericardial effusion. Mild aortic atherosclerosis. No aneurysm or dissection. Mediastinum/Nodes: No neck base, mediastinal or hilar masses or enlarged lymph nodes. Trachea and esophagus are unremarkable. Lungs/Pleura: Subtle ground-glass opacities, most evident in the left upper lobe, likely due to vascular shunting. Linear atelectasis noted in the dependent lower lobes and lung bases. Two small pulmonary nodules, right lower lobe, image 103, series 3, 6 mm, and right upper lobe, image 78, series 3, 4 mm. No pleural effusion.  No pneumothorax. Musculoskeletal: No fracture or acute finding. No suspicious bone lesion. Review of the MIP images confirms the above findings. CT ABDOMEN and PELVIS FINDINGS Hepatobiliary: Decreased liver attenuation consistent with fatty infiltration. No liver mass or focal lesion. Prior cholecystectomy. No bile duct dilation. Pancreas: Unremarkable. No pancreatic  ductal dilatation or surrounding inflammatory changes. Spleen: Normal in size without focal abnormality. Adrenals/Urinary Tract: No adrenal mass. Kidneys normal in overall size, orientation and position. No renal mass, stone or hydronephrosis. Chronic bilateral perinephric stranding. Normal ureters. Bladder mostly decompressed. Wall is prominent. Stomach/Bowel: Normal stomach. Small bowel and colon are normal in caliber. No wall thickening. No inflammation. Sigmoid colon anastomosis staple line. Vascular/Lymphatic: Aortic atherosclerosis. No aneurysm. No enlarged lymph nodes. Reproductive: Enlarged prostate measuring 7.6 x 6.4 x 7.6 cm. Other: No abdominal wall hernia or abnormality. No abdominopelvic ascites. Musculoskeletal: No fracture or acute finding. No suspicious bone lesion. Review of the MIP images confirms the above findings. IMPRESSION: CTA CHEST 1. Positive for pulmonary emboli. There are multiple bilateral segmental pulmonary emboli. Positive for acute PE with CT evidence of right heart strain (RV/LV Ratio = 1.15) consistent with at least submassive (intermediate risk) PE. The presence of right heart strain has been associated with an increased risk of  morbidity and mortality. Please refer to the "Code PE Focused" order set in EPIC. 2. Lungs show evidence of vascular shunting and atelectasis, but no evidence of an infarct, pleural effusion or pneumothorax. 3. Mild enlargement of the main pulmonary artery tp 3.9 cm. CT ABDOMEN AND PELVIS 1. No acute findings within the abdomen or pelvis. Electronically Signed   By: Lajean Manes M.D.   On: 09/19/2021 18:39   CT HEAD WO CONTRAST (5MM)  Result Date: 09/19/2021 CLINICAL DATA:  Found down, altered EXAM: CT HEAD WITHOUT CONTRAST TECHNIQUE: Contiguous axial images were obtained from the base of the skull through the vertex without intravenous contrast. RADIATION DOSE REDUCTION: This exam was performed according to the departmental dose-optimization program  which includes automated exposure control, adjustment of the mA and/or kV according to patient size and/or use of iterative reconstruction technique. COMPARISON:  CT brain 05/18/2020, MRI 05/19/2020 FINDINGS: Brain: No hemorrhage or intracranial mass. Chronic left cerebellar infarct. Atrophy and fairly extensive chronic small vessel ischemic changes of the white matter. More focal hypodensity within the left white matter, for example series 2, image 24 and series 2, image 27. These are not clearly present on comparison CT from 2022. Ventricles are nonenlarged. Vascular: No hyperdense vessels.  Carotid vascular calcification Skull: Normal. Negative for fracture or focal lesion. Sinuses/Orbits: No acute finding. Other: None IMPRESSION: 1. Negative for acute intracranial hemorrhage or mass 2. Atrophy and extensive chronic small vessel ischemic changes of the white matter. Interval hypodensities within the left white matter since the prior exam, consistent with age indeterminate small infarcts. Old left cerebellar infarct Electronically Signed   By: Donavan Foil M.D.   On: 09/19/2021 18:25   DG Chest Port 1 View  Result Date: 09/19/2021 CLINICAL DATA:  Found down EXAM: PORTABLE CHEST 1 VIEW COMPARISON:  06/25/2021 FINDINGS: Stable cardiomediastinal contours. Aortic atherosclerosis. Low lung volumes. No focal airspace consolidation, pleural effusion, or pneumothorax. IMPRESSION: No active disease. Electronically Signed   By: Davina Poke D.O.   On: 09/19/2021 17:12      IMPRESSION AND PLAN:  Assessment and Plan: * Acute pulmonary embolism ([place]) - He will be admitted to a progressive unit bed. - We will continue him on IV heparin per protocol. - We will obtain a 2D echo to assess for RV strain and bilateral lower extremity venous Doppler for further assessment for leg source. - The patient just had a Holter procedure on 6/29. - Though he was not supposed to have aspirin or anticoagulation/blood  thinners postoperatively, I believe that the benefit outweighed the risks in his case especially given his RV strain.  Essential hypertension - We will continue his antihypertensive while monitoring his BP.  Type 2 diabetes mellitus without complications (HCC) - We will place him on supplemental coverage with NovoLog. - We will hold off metformin. - We will continue his basal coverage.  Dyslipidemia - We will continue statin therapy.    Chronic obstructive pulmonary disease (COPD) (HCC) - We will continue his inhalers while holding off long-acting beta agonist.  BPH (benign prostatic hyperplasia) - The patient is status post HoLEP procedure on 6/29 and has a chronic indwelling Foley catheter for urinary retention. - We will continue his Flomax.    DVT prophylaxis: IV heparin. Advanced Care Planning:  Code Status: The patient is DNR/DNI.  This was discussed with him.  Family Communication:  The plan of care was discussed in details with the patient (and family). I answered all questions. The patient agreed to  proceed with the above mentioned plan. Further management will depend upon hospital course. Disposition Plan: Back to previous home environment Consults called: none.  Vascular surgery can later be obtained.  Dr. Trula Slade with any worsening condition.  At this time he does not need intervention per him. All the records are reviewed and case discussed with ED provider.  Status is: Inpatient  At the time of the admission, it appears that the appropriate admission status for this patient is inpatient.  This is judged to be reasonable and necessary in order to provide the required intensity of service to ensure the patient's safety given the presenting symptoms, physical exam findings and initial radiographic and laboratory data in the context of comorbid conditions.  The patient requires inpatient status due to high intensity of service, high risk of further deterioration and high  frequency of surveillance required.  I certify that at the time of admission, it is my clinical judgment that the patient will require inpatient hospital care extending more than 2 midnights.                            Dispo: The patient is from: Home              Anticipated d/c is to: Home              Patient currently is not medically stable to d/c.              Difficult to place patient: No  Christel Mormon M.D on 09/19/2021 at 10:21 PM  [facility]   From 7 PM-7 AM, contact night-coverage [email or web address]  CC: Primary care physician; Charlynne Cousins, MD

## 2021-09-19 NOTE — Progress Notes (Signed)
ANTICOAGULATION CONSULT NOTE - Initial Consult  Pharmacy Consult for Heparin Drip Indication: pulmonary embolus  Allergies  Allergen Reactions   Farxiga [Dapagliflozin] Rash    Causes a severe rash in groin area    Dulaglutide Diarrhea and Other (See Comments)   Liraglutide Diarrhea and Other (See Comments)   Jardiance [Empagliflozin] Rash    Patient Measurements: Height: '5\' 10"'$  (177.8 cm) Weight: 120.7 kg (266 lb) IBW/kg (Calculated) : 73 Heparin Dosing Weight: 101.3 kg  Vital Signs: Temp: 99.4 F (37.4 C) (06/30 1815) Temp Source: Oral (06/30 1815) BP: 139/68 (06/30 1900) Pulse Rate: 83 (06/30 1915)  Labs: Recent Labs    09/19/21 1650  HGB 12.2*  HCT 36.0*  PLT 230  APTT 29  LABPROT 15.6*  INR 1.3*  CREATININE 1.59*  CKTOTAL 114  TROPONINIHS 42*    Estimated Creatinine Clearance: 47.5 mL/min (A) (by C-G formula based on SCr of 1.59 mg/dL (H)).   Medical History: Past Medical History:  Diagnosis Date   Arthritis    Asthma 2000   Back pain    Colon polyp 2012   COPD (chronic obstructive pulmonary disease) (HCC)    Emphysema of lung (HCC)    Heart murmur    Hernia 2000   Hyperlipidemia    Hypertension    Personal history of malignant neoplasm of large intestine    Personal history of tobacco use, presenting hazards to health    Sleep apnea    Special screening for malignant neoplasms, colon    Type 2 diabetes mellitus with proteinuria ([place]) 10/11/2014    Assessment: Patient is a 81yo male admitted with PE. Patient has a prior history of DVT but was not on anticoagulation prior to admission. Pharmacy consulted for Heparin management.   Reviewed home med list and dispense records, no anticoagulants listed.  Baseline labs within range  Goal of Therapy:  Heparin level 0.3-0.7 units/ml Monitor platelets by anticoagulation protocol: Yes   Plan:  Give 6000 units bolus x 1 Start heparin infusion at 1700 units/hr Check anti-Xa level in 8 hours and  daily while on heparin Continue to monitor H&H and platelets  Paulina Fusi, PharmD, BCPS 09/19/2021 8:02 PM

## 2021-09-19 NOTE — ED Provider Notes (Addendum)
[facility] Provider Note    Event Date/Time   First MD Initiated Contact with Patient 09/19/21 1645     (approximate)   History   Weakness   HPI  Justin Griffith is a 81 y.o. male who presents to the ED for evaluation of Weakness   I reviewed urology clinic visit from earlier today, acute visit for nondraining catheter.  Seen in the clinic with urology yesterday and had a Foley catheter exchange.  Today, the Foley was irrigated, some blood clots and sediment were evacuated and catheter subsequently draining well. I reviewed cardiology visit from 6/14.  History of dCHF, CAD s/p CABG, COPD, obesity, OSA, DVT. I do not see any AC on his med list.   Patient presents to the ED after being found down in his front yard.  Last seen around lunchtime apparently.  Lives at home with his demented wife.  He was found on the ground in front of his house, febrile and confused with EMS.  Here patient reports feeling "like shit."  And cannot provide any relevant history.  He does not know what happened.  Physical Exam   Triage Vital Signs: ED Triage Vitals [09/19/21 1644]  Enc Vitals Group     BP      Pulse      Resp      Temp      Temp src      SpO2 (!) 86 %     Weight      Height      Head Circumference      Peak Flow      Pain Score      Pain Loc      Pain Edu?      Excl. in [place]?     Most recent vital signs: Vitals:   09/19/21 1900 09/19/21 1915  BP: 139/68   Pulse: 84 83  Resp:  19  Temp:    SpO2: 96% 96%    General: Lethargic, follows simple commands in all 4 extremities.  Without apparent deficit.  Skin is hot and dry. CV:  Good peripheral perfusion.  Tachycardic and regular strong radial pulses symmetrically bilaterally Resp:  Mild tachypnea to the mid 20s. Abd:  No distention.  Diffuse tenderness without peritoneal features MSK:  No deformity noted.  Neuro:  No focal deficits appreciated. Other:     ED Results / Procedures / Treatments    Labs (all labs ordered are listed, but only abnormal results are displayed) Labs Reviewed  COMPREHENSIVE METABOLIC PANEL - Abnormal; Notable for the following components:      Result Value   Potassium 3.3 (*)    Glucose, Bld 197 (*)    Creatinine, Ser 1.59 (*)    Albumin 2.9 (*)    GFR, Estimated 43 (*)    All other components within normal limits  CBC WITH DIFFERENTIAL/PLATELET - Abnormal; Notable for the following components:   WBC 13.5 (*)    RBC 4.03 (*)    Hemoglobin 12.2 (*)    HCT 36.0 (*)    Neutro Abs 11.4 (*)    All other components within normal limits  PROTIME-INR - Abnormal; Notable for the following components:   Prothrombin Time 15.6 (*)    INR 1.3 (*)    All other components within normal limits  URINALYSIS, COMPLETE (UACMP) WITH MICROSCOPIC - Abnormal; Notable for the following components:   Color, Urine YELLOW (*)    APPearance HAZY (*)  Glucose, UA 50 (*)    Hgb urine dipstick MODERATE (*)    Ketones, ur 5 (*)    Protein, ur >=300 (*)    Leukocytes,Ua MODERATE (*)    RBC / HPF >50 (*)    WBC, UA >50 (*)    Bacteria, UA RARE (*)    All other components within normal limits  MAGNESIUM - Abnormal; Notable for the following components:   Magnesium 1.4 (*)    All other components within normal limits  TROPONIN I (HIGH SENSITIVITY) - Abnormal; Notable for the following components:   Troponin I (High Sensitivity) 42 (*)    All other components within normal limits  CULTURE, BLOOD (ROUTINE X 2)  CULTURE, BLOOD (ROUTINE X 2)  URINE CULTURE  LACTIC ACID, PLASMA  APTT  CK  PROCALCITONIN  LACTIC ACID, PLASMA  PROCALCITONIN  TROPONIN I (HIGH SENSITIVITY)    EKG Wandering baseline.  Sinus tachycardia the rate of 108 bpm.  Normal axis and intervals without clear signs of acute ischemia.  Mild nonspecific ST changes laterally.  RADIOLOGY CT head interpreted by me without evidence of acute intracranial pathology CTA chest interpreted by me with  multifocal PE and signs of right heart strain.  Ct abd/pelvis interpreted by me without evidence of intra-abdominal pathology.   Official radiology report(s): CT Angio Chest PE W and/or Wo Contrast  Result Date: 09/19/2021 CLINICAL DATA:  per ems pt was found down in his driveway, states that he has probably been down since noon, pt has abrasions on bilat knees and a red rash on his abd. Pt arouses to verbal stim, pt states that he feel generally poorly, pt oriented to person, place and day of the week. EXAM: CT ANGIOGRAPHY CHEST CT ABDOMEN AND PELVIS WITH CONTRAST TECHNIQUE: Multidetector CT imaging of the chest was performed using the standard protocol during bolus administration of intravenous contrast. Multiplanar CT image reconstructions and MIPs were obtained to evaluate the vascular anatomy. Multidetector CT imaging of the abdomen and pelvis was performed using the standard protocol during bolus administration of intravenous contrast. RADIATION DOSE REDUCTION: This exam was performed according to the departmental dose-optimization program which includes automated exposure control, adjustment of the mA and/or kV according to patient size and/or use of iterative reconstruction technique. CONTRAST:  78m OMNIPAQUE IOHEXOL 350 MG/ML SOLN COMPARISON:  03/29/2020, CT abdomen pelvis. Current chest radiograph. FINDINGS: CTA CHEST FINDINGS Cardiovascular: There are multiple bilateral pulmonary emboli. On the right, pulmonary emboli are noted extending from the upper lobe pulmonary artery into multiple segmental branches. They appear nonocclusive. There are segmental branch pulmonary emboli to the medial and lateral segments of the right middle lobe. Segmental pulmonary emboli are noted to all segments of the right lower lobe. On the left a nonocclusive embolus straddles the peripheral main pulmonary artery into the left upper lobe anterior and apical segmental branches and left upper lobe lingular branch. There  are nonocclusive pulmonary emboli to the lower lobe segmental branches. No large central pulmonary emboli, however. Pulmonary artery is dilated to 3.9 cm. RV LV ratio is 1.15, mildly elevated. Heart is top-normal in size. Three-vessel coronary artery calcifications. No pericardial effusion. Mild aortic atherosclerosis. No aneurysm or dissection. Mediastinum/Nodes: No neck base, mediastinal or hilar masses or enlarged lymph nodes. Trachea and esophagus are unremarkable. Lungs/Pleura: Subtle ground-glass opacities, most evident in the left upper lobe, likely due to vascular shunting. Linear atelectasis noted in the dependent lower lobes and lung bases. Two small pulmonary nodules, right lower lobe, image  103, series 3, 6 mm, and right upper lobe, image 78, series 3, 4 mm. No pleural effusion.  No pneumothorax. Musculoskeletal: No fracture or acute finding. No suspicious bone lesion. Review of the MIP images confirms the above findings. CT ABDOMEN and PELVIS FINDINGS Hepatobiliary: Decreased liver attenuation consistent with fatty infiltration. No liver mass or focal lesion. Prior cholecystectomy. No bile duct dilation. Pancreas: Unremarkable. No pancreatic ductal dilatation or surrounding inflammatory changes. Spleen: Normal in size without focal abnormality. Adrenals/Urinary Tract: No adrenal mass. Kidneys normal in overall size, orientation and position. No renal mass, stone or hydronephrosis. Chronic bilateral perinephric stranding. Normal ureters. Bladder mostly decompressed. Wall is prominent. Stomach/Bowel: Normal stomach. Small bowel and colon are normal in caliber. No wall thickening. No inflammation. Sigmoid colon anastomosis staple line. Vascular/Lymphatic: Aortic atherosclerosis. No aneurysm. No enlarged lymph nodes. Reproductive: Enlarged prostate measuring 7.6 x 6.4 x 7.6 cm. Other: No abdominal wall hernia or abnormality. No abdominopelvic ascites. Musculoskeletal: No fracture or acute finding. No  suspicious bone lesion. Review of the MIP images confirms the above findings. IMPRESSION: CTA CHEST 1. Positive for pulmonary emboli. There are multiple bilateral segmental pulmonary emboli. Positive for acute PE with CT evidence of right heart strain (RV/LV Ratio = 1.15) consistent with at least submassive (intermediate risk) PE. The presence of right heart strain has been associated with an increased risk of morbidity and mortality. Please refer to the "Code PE Focused" order set in EPIC. 2. Lungs show evidence of vascular shunting and atelectasis, but no evidence of an infarct, pleural effusion or pneumothorax. 3. Mild enlargement of the main pulmonary artery tp 3.9 cm. CT ABDOMEN AND PELVIS 1. No acute findings within the abdomen or pelvis. Electronically Signed   By: Lajean Manes M.D.   On: 09/19/2021 18:39   CT ABDOMEN PELVIS W CONTRAST  Result Date: 09/19/2021 CLINICAL DATA:  per ems pt was found down in his driveway, states that he has probably been down since noon, pt has abrasions on bilat knees and a red rash on his abd. Pt arouses to verbal stim, pt states that he feel generally poorly, pt oriented to person, place and day of the week. EXAM: CT ANGIOGRAPHY CHEST CT ABDOMEN AND PELVIS WITH CONTRAST TECHNIQUE: Multidetector CT imaging of the chest was performed using the standard protocol during bolus administration of intravenous contrast. Multiplanar CT image reconstructions and MIPs were obtained to evaluate the vascular anatomy. Multidetector CT imaging of the abdomen and pelvis was performed using the standard protocol during bolus administration of intravenous contrast. RADIATION DOSE REDUCTION: This exam was performed according to the departmental dose-optimization program which includes automated exposure control, adjustment of the mA and/or kV according to patient size and/or use of iterative reconstruction technique. CONTRAST:  13m OMNIPAQUE IOHEXOL 350 MG/ML SOLN COMPARISON:  03/29/2020,  CT abdomen pelvis. Current chest radiograph. FINDINGS: CTA CHEST FINDINGS Cardiovascular: There are multiple bilateral pulmonary emboli. On the right, pulmonary emboli are noted extending from the upper lobe pulmonary artery into multiple segmental branches. They appear nonocclusive. There are segmental branch pulmonary emboli to the medial and lateral segments of the right middle lobe. Segmental pulmonary emboli are noted to all segments of the right lower lobe. On the left a nonocclusive embolus straddles the peripheral main pulmonary artery into the left upper lobe anterior and apical segmental branches and left upper lobe lingular branch. There are nonocclusive pulmonary emboli to the lower lobe segmental branches. No large central pulmonary emboli, however. Pulmonary artery is dilated to 3.9  cm. RV LV ratio is 1.15, mildly elevated. Heart is top-normal in size. Three-vessel coronary artery calcifications. No pericardial effusion. Mild aortic atherosclerosis. No aneurysm or dissection. Mediastinum/Nodes: No neck base, mediastinal or hilar masses or enlarged lymph nodes. Trachea and esophagus are unremarkable. Lungs/Pleura: Subtle ground-glass opacities, most evident in the left upper lobe, likely due to vascular shunting. Linear atelectasis noted in the dependent lower lobes and lung bases. Two small pulmonary nodules, right lower lobe, image 103, series 3, 6 mm, and right upper lobe, image 78, series 3, 4 mm. No pleural effusion.  No pneumothorax. Musculoskeletal: No fracture or acute finding. No suspicious bone lesion. Review of the MIP images confirms the above findings. CT ABDOMEN and PELVIS FINDINGS Hepatobiliary: Decreased liver attenuation consistent with fatty infiltration. No liver mass or focal lesion. Prior cholecystectomy. No bile duct dilation. Pancreas: Unremarkable. No pancreatic ductal dilatation or surrounding inflammatory changes. Spleen: Normal in size without focal abnormality.  Adrenals/Urinary Tract: No adrenal mass. Kidneys normal in overall size, orientation and position. No renal mass, stone or hydronephrosis. Chronic bilateral perinephric stranding. Normal ureters. Bladder mostly decompressed. Wall is prominent. Stomach/Bowel: Normal stomach. Small bowel and colon are normal in caliber. No wall thickening. No inflammation. Sigmoid colon anastomosis staple line. Vascular/Lymphatic: Aortic atherosclerosis. No aneurysm. No enlarged lymph nodes. Reproductive: Enlarged prostate measuring 7.6 x 6.4 x 7.6 cm. Other: No abdominal wall hernia or abnormality. No abdominopelvic ascites. Musculoskeletal: No fracture or acute finding. No suspicious bone lesion. Review of the MIP images confirms the above findings. IMPRESSION: CTA CHEST 1. Positive for pulmonary emboli. There are multiple bilateral segmental pulmonary emboli. Positive for acute PE with CT evidence of right heart strain (RV/LV Ratio = 1.15) consistent with at least submassive (intermediate risk) PE. The presence of right heart strain has been associated with an increased risk of morbidity and mortality. Please refer to the "Code PE Focused" order set in EPIC. 2. Lungs show evidence of vascular shunting and atelectasis, but no evidence of an infarct, pleural effusion or pneumothorax. 3. Mild enlargement of the main pulmonary artery tp 3.9 cm. CT ABDOMEN AND PELVIS 1. No acute findings within the abdomen or pelvis. Electronically Signed   By: Lajean Manes M.D.   On: 09/19/2021 18:39   CT HEAD WO CONTRAST (5MM)  Result Date: 09/19/2021 CLINICAL DATA:  Found down, altered EXAM: CT HEAD WITHOUT CONTRAST TECHNIQUE: Contiguous axial images were obtained from the base of the skull through the vertex without intravenous contrast. RADIATION DOSE REDUCTION: This exam was performed according to the departmental dose-optimization program which includes automated exposure control, adjustment of the mA and/or kV according to patient size  and/or use of iterative reconstruction technique. COMPARISON:  CT brain 05/18/2020, MRI 05/19/2020 FINDINGS: Brain: No hemorrhage or intracranial mass. Chronic left cerebellar infarct. Atrophy and fairly extensive chronic small vessel ischemic changes of the white matter. More focal hypodensity within the left white matter, for example series 2, image 24 and series 2, image 27. These are not clearly present on comparison CT from 2022. Ventricles are nonenlarged. Vascular: No hyperdense vessels.  Carotid vascular calcification Skull: Normal. Negative for fracture or focal lesion. Sinuses/Orbits: No acute finding. Other: None IMPRESSION: 1. Negative for acute intracranial hemorrhage or mass 2. Atrophy and extensive chronic small vessel ischemic changes of the white matter. Interval hypodensities within the left white matter since the prior exam, consistent with age indeterminate small infarcts. Old left cerebellar infarct Electronically Signed   By: Donavan Foil M.D.   On:  09/19/2021 18:25   DG Chest Port 1 View  Result Date: 09/19/2021 CLINICAL DATA:  Found down EXAM: PORTABLE CHEST 1 VIEW COMPARISON:  06/25/2021 FINDINGS: Stable cardiomediastinal contours. Aortic atherosclerosis. Low lung volumes. No focal airspace consolidation, pleural effusion, or pneumothorax. IMPRESSION: No active disease. Electronically Signed   By: Davina Poke D.O.   On: 09/19/2021 17:12    PROCEDURES and INTERVENTIONS:  .1-3 Lead EKG Interpretation  Performed by: Vladimir Crofts, MD Authorized by: Vladimir Crofts, MD     Interpretation: abnormal     ECG rate:  108   ECG rate assessment: tachycardic     Rhythm: sinus tachycardia     Ectopy: none     Conduction: normal   .Critical Care  Performed by: Vladimir Crofts, MD Authorized by: Vladimir Crofts, MD   Critical care provider statement:    Critical care time (minutes):  30   Critical care time was exclusive of:  Separately billable procedures and treating other patients    Critical care was necessary to treat or prevent imminent or life-threatening deterioration of the following conditions:  Respiratory failure and circulatory failure   Critical care was time spent personally by me on the following activities:  Development of treatment plan with patient or surrogate, discussions with consultants, evaluation of patient's response to treatment, examination of patient, ordering and review of laboratory studies, ordering and review of radiographic studies, ordering and performing treatments and interventions, pulse oximetry, re-evaluation of patient's condition and review of old charts   Medications  heparin ADULT infusion 100 units/mL (25000 units/237m) (1,700 Units/hr Intravenous New Bag/Given 09/19/21 1922)  lactated ringers bolus 1,000 mL (0 mLs Intravenous Stopped 09/19/21 1816)  acetaminophen (TYLENOL) tablet 1,000 mg (1,000 mg Oral Given 09/19/21 1714)  iohexol (OMNIPAQUE) 350 MG/ML injection 75 mL (75 mLs Intravenous Contrast Given 09/19/21 1758)  heparin bolus via infusion 6,000 Units (6,000 Units Intravenous Bolus from Bag 09/19/21 1923)     IMPRESSION / MDM / [place]/ ED COURSE  I reviewed the triage vital signs and the nursing notes.  Differential diagnosis includes, but is not limited to, stroke, seizure, rhabdomyolysis, PE, sepsis  {Patient presents with symptoms of an acute illness or injury that is potentially life-threatening.  81year old male with history of DVT but apparently not on anticoagulation presents after being found down in his front yard, with evidence of acute PE and requiring heparinization and medical admission.  He is tachycardic and hypoxic on arrival but remains hemodynamically stable.  Blood work with mild hypokalemia and magnesium Mia.  Mild leukocytosis.  Procalcitonin is negative.  His urine seems infectious, but this is from indwelling Foley catheter and likely due to colonization since it was just placed a couple days  ago.  I suspect fever is due to environmental exposure with him being down on a hot day. We will start heparin and consult with medicine for admission.  I consulted with vascular surgery, Dr. [place]ula Slade [place] for our typical coverage.  He does not perform PE thrombectomy or interventions [place].  After reviewing the images he does not think patient would need intervention considering the scattered and small/segmental nature of his disease.  If there was a larger clot to go after then [facility], but again he thinks patient would be fine to  stay here with anticoagulation and supportive care.  Clinical Course as of 09/19/21 1932  Fri Sep 19, 2021  1702 Reassessed.  Looks better.  Sitting up in bed and sharper mentally.  Keeping his eyes open.  Answering triage questions [DS]  1845 Reassessed.  Wife and daughter at the bedside now.  Wife has dementia and is not of any significant help with history.  Daughter was not with the patient today when they were coming home.  It sounds like they were coming home from the urologist visit where he had his catheter irrigated, he could not get out of the car and then fell to the ground and could not get up. [DS]  1847 Patient is still amnestic of these events. [DS]  1852 Discussed CT results with PE.  Vascular surgery paged.  Remains on 2 L nasal cannula due to hypoxia [DS]  1859 I consult with Dr. Trula Slade, he reviews CT imaging. Recommends starting AC. May not require intervention.  [DS]  1932 I consult hospitalist here who agrees to admit. [DS]    Clinical Course User Index [DS] Vladimir Crofts, MD     FINAL CLINICAL IMPRESSION(S) / ED DIAGNOSES   Final diagnoses:  Multiple subsegmental pulmonary emboli without acute cor pulmonale ([place])  Hypoxia     Rx  / DC Orders   ED Discharge Orders     None        Note:  This document was prepared using Dragon voice recognition software and may include unintentional dictation errors.   Vladimir Crofts, MD 09/19/21 Loretha Stapler    Vladimir Crofts, MD 09/19/21 Joen Laura

## 2021-09-19 NOTE — ED Triage Notes (Signed)
Pt to er room number 6 via ems, per ems pt was found down in his driveway, states that he has probably been down since noon, pt has abrasions on bilat knees and a red rash on his abd.  Pt arouses to verbal stim, pt states that he feel generally poorly, pt oriented to person, place and day of the week.

## 2021-09-19 NOTE — Assessment & Plan Note (Signed)
-   The patient is status post HoLEP procedure on 6/29 and has a chronic indwelling Foley catheter for urinary retention. - We will continue his Flomax.

## 2021-09-20 ENCOUNTER — Inpatient Hospital Stay
Admit: 2021-09-20 | Discharge: 2021-09-20 | Disposition: A | Discharge status: Home / Self Care | Payer: HMO | Attending: Family Medicine | Admitting: Family Medicine

## 2021-09-20 DIAGNOSIS — I2694 Multiple subsegmental pulmonary emboli without acute cor pulmonale: Principal | ICD-10-CM

## 2021-09-20 DIAGNOSIS — N390 Urinary tract infection, site not specified: Secondary | ICD-10-CM

## 2021-09-20 DIAGNOSIS — T83511A Infection and inflammatory reaction due to indwelling urethral catheter, initial encounter: Secondary | ICD-10-CM

## 2021-09-20 DIAGNOSIS — I2699 Other pulmonary embolism without acute cor pulmonale: Secondary | ICD-10-CM | POA: Insufficient documentation

## 2021-09-20 DIAGNOSIS — R350 Frequency of micturition: Secondary | ICD-10-CM

## 2021-09-20 DIAGNOSIS — N401 Enlarged prostate with lower urinary tract symptoms: Secondary | ICD-10-CM

## 2021-09-20 LAB — CBG MONITORING, ED
Glucose-Capillary: 130 mg/dL — ABNORMAL HIGH (ref 70–99)
Glucose-Capillary: 180 mg/dL — ABNORMAL HIGH (ref 70–99)

## 2021-09-20 LAB — CBC
HCT: 41.2 % (ref 39.0–52.0)
Hemoglobin: 13.8 g/dL (ref 13.0–17.0)
MCH: 30.1 pg (ref 26.0–34.0)
MCHC: 33.5 g/dL (ref 30.0–36.0)
MCV: 89.8 fL (ref 80.0–100.0)
Platelets: 245 10*3/uL (ref 150–400)
RBC: 4.59 MIL/uL (ref 4.22–5.81)
RDW: 12.1 % (ref 11.5–15.5)
WBC: 13.6 10*3/uL — ABNORMAL HIGH (ref 4.0–10.5)
nRBC: 0 % (ref 0.0–0.2)

## 2021-09-20 LAB — GLUCOSE, CAPILLARY
Glucose-Capillary: 147 mg/dL — ABNORMAL HIGH (ref 70–99)
Glucose-Capillary: 190 mg/dL — ABNORMAL HIGH (ref 70–99)

## 2021-09-20 LAB — ECHOCARDIOGRAM COMPLETE
AR max vel: 1.48 cm2
AV Area VTI: 1.56 cm2
AV Area mean vel: 1.47 cm2
AV Mean grad: 13 mmHg
AV Peak grad: 22.3 mmHg
Ao pk vel: 2.36 m/s
Area-P 1/2: 2.63 cm2
Height: 70 in
S' Lateral: 2.48 cm
Weight: 4256 oz

## 2021-09-20 LAB — HEPARIN LEVEL (UNFRACTIONATED)
Heparin Unfractionated: 0.53 IU/mL (ref 0.30–0.70)
Heparin Unfractionated: 0.2 IU/mL — ABNORMAL LOW (ref 0.30–0.70)

## 2021-09-20 LAB — PROCALCITONIN: Procalcitonin: 0.28 ng/mL

## 2021-09-20 MED ORDER — APIXABAN 5 MG PO TABS: 5.0000 mg | ORAL_TABLET | Freq: Two times a day (BID) | ORAL | Status: DC

## 2021-09-20 MED ORDER — APIXABAN 5 MG PO TABS
10.0000 mg | ORAL_TABLET | Freq: Two times a day (BID) | ORAL | Status: DC
  Administered 2021-09-20 – 2021-09-21 (×2): 10 mg via ORAL
  Filled 2021-09-20 (×2): qty 2

## 2021-09-20 MED ORDER — SODIUM CHLORIDE 0.9 % IV SOLN
1.0000 g | INTRAVENOUS | Status: DC
  Administered 2021-09-20 – 2021-09-21 (×2): 1 g via INTRAVENOUS
  Filled 2021-09-20 (×2): qty 10

## 2021-09-20 MED ORDER — HEPARIN BOLUS VIA INFUSION
1500.0000 [IU] | Freq: Once | INTRAVENOUS | Status: AC
  Administered 2021-09-20: 1500 [IU] via INTRAVENOUS
  Filled 2021-09-20: qty 1500

## 2021-09-20 MED ORDER — VITAMIN D 25 MCG (1000 UNIT) PO TABS
2000.0000 [IU] | ORAL_TABLET | Freq: Every day | ORAL | Status: DC
  Administered 2021-09-20: 2000 [IU] via ORAL

## 2021-09-20 MED ORDER — TRAMADOL HCL 50 MG PO TABS
50.0000 mg | ORAL_TABLET | Freq: Four times a day (QID) | ORAL | Status: DC | PRN
  Administered 2021-09-20: 50 mg via ORAL
  Filled 2021-09-20: qty 1

## 2021-09-20 NOTE — ED Notes (Signed)
Pt given ice water and urine bag emptied

## 2021-09-20 NOTE — Assessment & Plan Note (Addendum)
Meets criteria with BMI greater than 35 and comorbidity of hypertension

## 2021-09-20 NOTE — ED Notes (Signed)
Pt was complaining of pain at his catheter. Pt's cath was checked and it was found to be kinked. Tubing straightened and urine flowing freely

## 2021-09-20 NOTE — ED Notes (Signed)
Pt in hospital bed, urine bag emptied, R knee rewrapped

## 2021-09-20 NOTE — Hospital Course (Signed)
81 year old male with past medical history of BPH, CAD status post CABG, COPD and morbid obesity who presented to the emergency room with complaints of generalized weakness and reportedly had some syncope.  He had been to the urologist office on the morning of 6/30 for Foley catheter exchange.  Sometime after lunch, he was found down in the front yard and noted to be febrile and confused.  In the emergency room, work-up revealed multiple pulmonary emboli and initially mildly hypoxic although this soon resolved.  Also noted to have a urinary tract infection.  Admitted to the hospitalist service.  Seen by vascular surgery who did not recommend any intervention.  Echocardiogram stable.

## 2021-09-20 NOTE — Progress Notes (Signed)
[place] for Heparin Drip Indication: pulmonary embolus  Allergies  Allergen Reactions   Farxiga [Dapagliflozin] Rash    Causes a severe rash in groin area    Dulaglutide Diarrhea and Other (See Comments)   Liraglutide Diarrhea and Other (See Comments)   Jardiance [Empagliflozin] Rash    Patient Measurements: Height: '5\' 10"'$  (177.8 cm) Weight: 120.7 kg (266 lb) IBW/kg (Calculated) : 73 Heparin Dosing Weight: 101.3 kg  Vital Signs: Temp: 99.4 F (37.4 C) (06/30 1815) Temp Source: Oral (06/30 1815) BP: 184/62 (07/01 0400) Pulse Rate: 88 (07/01 0400)  Labs: Recent Labs    09/19/21 1650 09/19/21 1940 09/20/21 0349  HGB 12.2*  --  13.8  HCT 36.0*  --  41.2  PLT 230  --  245  APTT 29  --   --   LABPROT 15.6*  --   --   INR 1.3*  --   --   HEPARINUNFRC  --   --  0.53  CREATININE 1.59*  --   --   CKTOTAL 114  --   --   TROPONINIHS 42* 47*  --      Estimated Creatinine Clearance: 47.5 mL/min (A) (by C-G formula based on SCr of 1.59 mg/dL (H)).   Medical History: Past Medical History:  Diagnosis Date   Acute urinary retention    Arthritis    Asthma 2000   Back pain    Colon polyp 2012   COPD (chronic obstructive pulmonary disease) (HCC)    Emphysema of lung (HCC)    Heart murmur    Hernia 2000   Hyperlipidemia    Hypertension    Personal history of malignant neoplasm of large intestine    Personal history of tobacco use, presenting hazards to health    Sleep apnea    Special screening for malignant neoplasms, colon    Type 2 diabetes mellitus with proteinuria ([place]) 10/11/2014    Assessment: Patient is a 81yo male admitted with PE. Patient has a prior history of DVT but was not on anticoagulation prior to admission. Pharmacy consulted for Heparin management.   Reviewed home med list and dispense records, no anticoagulants listed.  Baseline labs within range  Goal of Therapy:  Heparin level 0.3-0.7  units/ml Monitor platelets by anticoagulation protocol: Yes   7/01 0349 HL 0.53, therapeutic x 1  Plan:  Continue heparin infusion at 1700 units/hr Recheck HL in 8 hr to confirm, then daily CBC daily while on heparin  Renda Rolls, PharmD, [facility] 09/20/2021 4:52 AM

## 2021-09-20 NOTE — Progress Notes (Signed)
[facility] Progress Note  Patient: Justin Griffith    AST:[id number]  DOA: 09/19/2021    Date of Service: the patient was seen and examined on 09/20/2021  Brief hospital course: 81 year old male with past medical history of BPH, CAD status post CABG, COPD and morbid obesity who presented to the emergency room with complaints of generalized weakness and reportedly had some syncope.  He had been to the urologist office on the morning of 6/30 for Foley catheter exchange.  Sometime after lunch, he was found down in the front yard and noted to be febrile and confused.  In the emergency room, work-up revealed multiple pulmonary emboli and initially mildly hypoxic although this soon resolved.  Also noted to have a urinary tract infection.  Admitted to the hospitalist service.  Seen by vascular surgery who did not recommend any intervention.  Echocardiogram stable.  Assessment and Plan: Assessment and Plan: * Acute pulmonary embolism ([place]) Stable.  No need for intervention by vascular surgery.  Briefly hypoxic but no longer.  Change heparin over to Eliquis.  Echocardiogram unremarkable.  Essential hypertension - We will continue his antihypertensive while monitoring his BP. Please note that patient reportedly has a previous history of diastolic heart failure however every echocardiogram including 1 done on this hospitalization note normal ejection fraction and no evidence of diastolic dysfunction.  Patient does not have CHF.  Type 2 diabetes mellitus without complications (HCC) - We will place him on supplemental coverage with NovoLog. - We will hold off metformin. - We will continue his basal coverage.  Dyslipidemia - We will continue statin therapy.    Urinary tract infection UTI noted on urinalysis.  Given elevated procalcitonin and increasing white blood cell count and low-grade temps, have started IV Rocephin.  Urine culture pending.  Sepsis is ruled out as patient's tachycardia, tachypnea  and fever could also be attributed to PE  Chronic obstructive pulmonary disease (COPD) ([place]) - We will continue his inhalers while holding off long-acting beta agonist.  BPH (benign prostatic hyperplasia) - The patient is status post HoLEP procedure on 6/29 and has a chronic indwelling Foley catheter for urinary retention. - We will continue his Flomax.  Morbid obesity ([place]) Meets criteria with BMI greater than 35 and comorbidity of hypertension       Body mass index is 38.17 kg/m.        Consultants: Vascular surgery  Procedures: Echocardiogram unremarkable  Antimicrobials: IV Rocephin 7/1-present  Code Status: Full code   Subjective: Patient doing okay, complains of chronic knee pain and neuropathy  Objective: Vital signs were reviewed and unremarkable. Vitals:   09/20/21 1433 09/20/21 1500  BP:  132/66  Pulse: (!) 57 64  Resp: 18 19  Temp:    SpO2: 98% 97%    Intake/Output Summary (Last 24 hours) at 09/20/2021 1523 Last data filed at 09/20/2021 1112 Gross per 24 hour  Intake 2359.75 ml  Output 5950 ml  Net -3590.25 ml   Filed Weights   09/19/21 1654  Weight: 120.7 kg   Body mass index is 38.17 kg/m.  Exam:  General: Alert and oriented x3, hard of hearing HEENT: Normocephalic, atraumatic, mucous membranes are slightly dry Cardiovascular: Regular rate and rhythm, S1-S2, 2 out of 6 systolic ejection murmur Respiratory: Clear to auscultation bilaterally Abdomen: Soft, obese, nontender, positive bowel sounds Musculoskeletal: No clubbing or cyanosis, trace pitting edema Skin: No skin breaks, tears or lesions Psychiatry: Appropriate, no evidence of psychoses Neurology: Subjective lower extremity neuropathy  Data Reviewed:  White blood cell count 13.6, procalcitonin 0.28  Disposition:  Status is: Inpatient Remains inpatient appropriate because: Transition to Eliquis.  Treating urinary tract infection    Anticipated discharge date:  7/2    Family Communication: Wife at the bedside DVT Prophylaxis:   Heparin infusion, transition to Eliquis    Author: Annita Brod ,MD 09/20/2021 3:23 PM  To reach On-call, see care teams to locate the attending and reach out via [email or web address]. Between 7PM-7AM, please contact night-coverage If you still have difficulty reaching the attending provider, please page the [facility] (Director on Call) for [facility] on amion for assistance.

## 2021-09-20 NOTE — Assessment & Plan Note (Addendum)
UTI noted on urinalysis.  Given elevated procalcitonin and increasing white blood cell count and low-grade temps, received 2 days of IV Rocephin.  Urine culture positive for 100,000 colonies of Pseudomonas and 70,000 colonies of Enterococcus.  Sensitivities pending.  Have ordered 2 more days of Cipro.  We will watch for sensitivities and adjust antibiotics accordingly sepsis is ruled out as patient's tachycardia, tachypnea and fever could also be attributed to PE.

## 2021-09-20 NOTE — Progress Notes (Signed)
[facility] for Heparin Drip Indication: pulmonary embolus  Allergies  Allergen Reactions   Farxiga [Dapagliflozin] Rash    Causes a severe rash in groin area    Dulaglutide Diarrhea and Other (See Comments)   Liraglutide Diarrhea and Other (See Comments)   Jardiance [Empagliflozin] Rash    Patient Measurements: Height: '5\' 10"'$  (177.8 cm) Weight: 120.7 kg (266 lb) IBW/kg (Calculated) : 73 Heparin Dosing Weight: 101.3 kg  Vital Signs: BP: 132/60 (07/01 1137) Pulse Rate: 65 (07/01 1137)  Labs: Recent Labs    09/19/21 1650 09/19/21 1940 09/20/21 0349 09/20/21 1350  HGB 12.2*  --  13.8  --   HCT 36.0*  --  41.2  --   PLT 230  --  245  --   APTT 29  --   --   --   LABPROT 15.6*  --   --   --   INR 1.3*  --   --   --   HEPARINUNFRC  --   --  0.53 0.20*  CREATININE 1.59*  --   --   --   CKTOTAL 114  --   --   --   TROPONINIHS 42* 47*  --   --      Estimated Creatinine Clearance: 47.5 mL/min (A) (by C-G formula based on SCr of 1.59 mg/dL (H)).   Medical History: Past Medical History:  Diagnosis Date   Acute urinary retention    Arthritis    Asthma 2000   Back pain    Colon polyp 2012   COPD (chronic obstructive pulmonary disease) (HCC)    Emphysema of lung (HCC)    Heart murmur    Hernia 2000   Hyperlipidemia    Hypertension    Personal history of malignant neoplasm of large intestine    Personal history of tobacco use, presenting hazards to health    Sleep apnea    Special screening for malignant neoplasms, colon    Type 2 diabetes mellitus with proteinuria ([place]) 10/11/2014    Assessment: Patient is a 81yo male admitted with PE. Patient has a prior history of DVT but was not on anticoagulation prior to admission. Pharmacy consulted for Heparin management.   Reviewed home med list and dispense records, no anticoagulants listed.  Baseline labs within range    Goal of Therapy:  Heparin level 0.3-0.7  units/ml Monitor platelets by anticoagulation protocol: Yes   7/01 0349 HL 0.53, therapeutic x 1 0701 1350 HL=0.20 subtherap, increase from 1700 u/hr to 1900 units/hr  Plan:  0701 1350 HL=0.20   subtherapeutic Order Bolus of 1500 units x 1 Increase heparin drip from 1700 u/hr to 1900 units/hr Recheck HL in 8 hr CBC daily while on heparin  Chinita Greenland PharmD Clinical Pharmacist 09/20/2021

## 2021-09-20 NOTE — Progress Notes (Signed)
[facility] for Apixaban Indication: pulmonary embolus  Allergies  Allergen Reactions   Farxiga [Dapagliflozin] Rash    Causes a severe rash in groin area    Dulaglutide Diarrhea and Other (See Comments)   Liraglutide Diarrhea and Other (See Comments)   Jardiance [Empagliflozin] Rash    Patient Measurements: Height: '5\' 10"'$  (177.8 cm) Weight: 120.7 kg (266 lb) IBW/kg (Calculated) : 73 Heparin Dosing Weight:    Vital Signs: BP: 132/66 (07/01 1500) Pulse Rate: 64 (07/01 1500)  Labs: Recent Labs    09/19/21 1650 09/19/21 1940 09/20/21 0349 09/20/21 1350  HGB 12.2*  --  13.8  --   HCT 36.0*  --  41.2  --   PLT 230  --  245  --   APTT 29  --   --   --   LABPROT 15.6*  --   --   --   INR 1.3*  --   --   --   HEPARINUNFRC  --   --  0.53 0.20*  CREATININE 1.59*  --   --   --   CKTOTAL 114  --   --   --   TROPONINIHS 42* 47*  --   --     Estimated Creatinine Clearance: 47.5 mL/min (A) (by C-G formula based on SCr of 1.59 mg/dL (H)).   Medical History: Past Medical History:  Diagnosis Date   Acute urinary retention    Arthritis    Asthma 2000   Back pain    Colon polyp 2012   COPD (chronic obstructive pulmonary disease) (HCC)    Emphysema of lung (HCC)    Heart murmur    Hernia 2000   Hyperlipidemia    Hypertension    Personal history of malignant neoplasm of large intestine    Personal history of tobacco use, presenting hazards to health    Sleep apnea    Special screening for malignant neoplasms, colon    Type 2 diabetes mellitus with proteinuria ([place]) 10/11/2014    Assessment: Patient is a 81yo male admitted with PE. Patient has a prior history of DVT but was not on anticoagulation prior to admission. Patient was initiated on Heparin infusion. Pharmacy consulted to transition patient to Apixaban.   Plan:  -Will stop Heparin infusion at 20:00 -Apixaban '10mg'$  bid x 7 days beginning tonight then '5mg'$  bid -CBC per  protocol  Paulina Fusi, PharmD, BCPS 09/20/2021 3:43 PM

## 2021-09-21 LAB — BASIC METABOLIC PANEL
Anion gap: 8 (ref 5–15)
BUN: 15 mg/dL (ref 8–23)
CO2: 30 mmol/L (ref 22–32)
Calcium: 9.2 mg/dL (ref 8.9–10.3)
Chloride: 98 mmol/L (ref 98–111)
Creatinine, Ser: 1.54 mg/dL — ABNORMAL HIGH (ref 0.61–1.24)
GFR, Estimated: 45 mL/min — ABNORMAL LOW (ref >60)
Glucose, Bld: 124 mg/dL — ABNORMAL HIGH (ref 70–99)
Potassium: 2.8 mmol/L — ABNORMAL LOW (ref 3.5–5.1)
Sodium: 136 mmol/L (ref 135–145)

## 2021-09-21 LAB — CULTURE, URINE COMPREHENSIVE

## 2021-09-21 LAB — CBC
HCT: 33.5 % — ABNORMAL LOW (ref 39.0–52.0)
Hemoglobin: 11.3 g/dL — ABNORMAL LOW (ref 13.0–17.0)
MCH: 30 pg (ref 26.0–34.0)
MCHC: 33.7 g/dL (ref 30.0–36.0)
MCV: 88.9 fL (ref 80.0–100.0)
Platelets: 265 10*3/uL (ref 150–400)
RBC: 3.77 MIL/uL — ABNORMAL LOW (ref 4.22–5.81)
RDW: 12.3 % (ref 11.5–15.5)
WBC: 9.9 10*3/uL (ref 4.0–10.5)
nRBC: 0 % (ref 0.0–0.2)

## 2021-09-21 LAB — GLUCOSE, CAPILLARY
Glucose-Capillary: 126 mg/dL — ABNORMAL HIGH (ref 70–99)
Glucose-Capillary: 171 mg/dL — ABNORMAL HIGH (ref 70–99)

## 2021-09-21 LAB — PROCALCITONIN: Procalcitonin: 0.3 ng/mL

## 2021-09-21 MED ORDER — APIXABAN (ELIQUIS) VTE STARTER PACK (10MG AND 5MG): ORAL_TABLET | ORAL | 0 refills | Status: DC

## 2021-09-21 MED ORDER — POTASSIUM CHLORIDE CRYS ER 20 MEQ PO TBCR
40.0000 meq | EXTENDED_RELEASE_TABLET | Freq: Once | ORAL | Status: AC
  Administered 2021-09-21: 40 meq via ORAL
  Filled 2021-09-21: qty 2

## 2021-09-21 MED ORDER — CIPROFLOXACIN HCL 500 MG PO TABS: 500.0000 mg | ORAL_TABLET | Freq: Two times a day (BID) | ORAL | 0 refills | Status: AC

## 2021-09-21 NOTE — Plan of Care (Signed)
Pt AAOx4, no pain. VS are stable. AVS and education provided. IV removed with no complications. Pt transported by [facility] to main lobby where his daughter will transport him home.

## 2021-09-21 NOTE — TOC Initial Note (Signed)
Transition of Care [facility]) - Initial/Assessment Note    Patient Details  Name: Justin Griffith MRN: [id number] Date of Birth: 1940/10/17  [facility]) CM/SW Contact:    Magnus Ivan, LCSW Phone Number: 09/21/2021, 10:48 AM  Clinical Narrative:                 CSW completed readmission risk assessment with patient. Patient lives with his wife who provides transportation. PCP is Dr. Neomia Dear. Pharmacy is [facility]. Patient has a RW and cane at home. Patient had [facility] in the past. Patient went to [facility] in the past, says he would not go back to a SNF for STR. TOC to follow for needs.   Expected Discharge Plan: Home/Self Care Barriers to Discharge: Continued Medical Work up   Patient Goals and CMS Choice Patient states their goals for this hospitalization and ongoing recovery are:: home with wife CMS Medicare.gov Compare Post Acute Care list provided to:: Patient Choice offered to / list presented to : Patient  Expected Discharge Plan and Services Expected Discharge Plan: Home/Self Care       Living arrangements for the past 2 months: Single Family Home Expected Discharge Date: 09/21/21                                    Prior Living Arrangements/Services Living arrangements for the past 2 months: Single Family Home Lives with:: Spouse Patient language and need for interpreter reviewed:: Yes Do you feel safe going back to the place where you live?: Yes      Need for Family Participation in Patient Care: Yes (Comment) Care giver support system in place?: Yes (comment) Current home services: DME Criminal Activity/Legal Involvement Pertinent to Current Situation/Hospitalization: No - Comment as needed  Activities of Daily Living      Permission Sought/Granted Permission sought to share information with : Facility Sport and exercise psychologist, Family Supports Permission granted to share information with : Yes, Verbal Permission Granted     Permission granted  to share info w AGENCY: as needed        Emotional Assessment       Orientation: : Oriented to Self, Oriented to Place, Oriented to  Time, Oriented to Situation Alcohol / Substance Use: Not Applicable Psych Involvement: No (comment)  Admission diagnosis:  Hypoxia [R09.02] Acute pulmonary embolism (HCC) [I26.99] Pulmonary emboli (HCC) [I26.99] Multiple subsegmental pulmonary emboli without acute cor pulmonale (HCC) [I26.94] Patient Active Problem List   Diagnosis Date Noted   Pulmonary emboli ([place]) 09/20/2021   Acute pulmonary embolism ([place]) 09/19/2021   Essential hypertension 09/19/2021   Type 2 diabetes mellitus without complications ([place]) 98/92/1194   Dyslipidemia 09/19/2021   Hyperlipidemia 09/11/2021   Cannot urinate 09/11/2021   Urinary tract infection without hematuria 07/08/2021   Acute renal failure ([place]) 07/08/2021   Generalized weakness 06/25/2021   Lymphedema 03/17/2021   Headache disorder 02/27/2021   Falls frequently 11/28/2020   Weakness of lower extremity 11/28/2020   Aortic atherosclerosis ([place]) 17/40/8144   Embolic stroke ([place]) 81/85/6314   AKI (acute kidney injury) ([place]) 05/18/2020   Leukocytosis 05/16/2020   Hypercalcemia 05/16/2020   Acute on chronic diastolic CHF (congestive heart failure) ([place]) 05/16/2020   At high risk for falls 05/08/2020   History of cold sores 05/06/2020   Hypomagnesemia 03/29/2020   Urinary tract infection 03/29/2020   NSTEMI (non-ST elevated myocardial infarction) ([place]) 03/28/2020   Hypokalemia 03/28/2020  Elevated TSH 02/25/2020   Poor mobility 02/02/2020   Coronary artery disease 01/23/2020   History of CVA (cerebrovascular accident) 01/23/2020   Memory changes 01/01/2020   Chronic venous insufficiency 05/23/2019   B12 deficiency 04/23/2019   Constipation 04/21/2019   Tremor 11/14/2018   Morbid obesity ([place]) 09/07/2017   Chronic obstructive pulmonary disease (COPD) ([place]) 04/29/2017   Intertrigo 02/02/2017   BPH  (benign prostatic hyperplasia) 07/23/2016   Advanced care planning/counseling discussion 47/11/6281   Eosinophilic esophagitis 66/29/4765   PAD (peripheral artery disease) ([place]) 01/08/2016   Gastroesophageal reflux disease without esophagitis 01/06/2016   Hyperlipidemia associated with type 2 diabetes mellitus ([place]) 01/16/2015   Sleep apnea 01/16/2015   BMI 40.0-44.9, adult ([place]) 01/16/2015   Hypertension associated with diabetes ([place]) 10/11/2014   Type 2 diabetes mellitus with proteinuria ([place]) 10/11/2014   Back pain 10/11/2014   History of colon cancer    PCP:  Charlynne Cousins, MD Pharmacy:   [place], [place] [address] [place] [address] Phone: [phone] Fax: [place], [place] [place]. [place] [address] Phone: [phone] Fax: [phone]     Social Determinants of Health (SDOH) Interventions    Readmission Risk Interventions    09/21/2021   10:42 AM 05/21/2020   10:01 AM 05/18/2020   12:17 PM  Readmission Risk Prevention Plan  Transportation Screening Complete Complete Complete  PCP or Specialist Appt within 3-5 Days Complete Complete   HRI or Home Care Consult Complete Complete Complete  Social Work Consult for [place] Planning/Counseling Complete Complete Complete  Palliative Care Screening Not Applicable Not Applicable Not Applicable  Medication Review Press photographer) Complete Complete Complete

## 2021-09-21 NOTE — Discharge Summary (Signed)
Physician Discharge Summary   Patient: Justin Griffith MRN: [id number] DOB: [birth year]  Admit date:     09/19/2021  Discharge date: 09/21/21  Discharge Physician: Annita Brod   PCP: Charlynne Cousins, MD   Recommendations at discharge:   New medication: Cipro 500 p.o. twice daily x2 days New medication: Eliquis 10 mg p.o. twice daily x1 week then 5 mg p.o. twice daily for the next 6 months Patient will go home with home health physical therapy  Discharge Diagnoses: Principal Problem:   Acute pulmonary embolism ([place]) Active Problems:   Essential hypertension   Type 2 diabetes mellitus without complications ([place])   Dyslipidemia   Chronic obstructive pulmonary disease (COPD) ([place])   Urinary tract infection   BPH (benign prostatic hyperplasia)   Morbid obesity ([place])  Resolved Problems:   * No resolved hospital problems. *  Hospital Course: 81 year old male with past medical history of BPH, CAD status post CABG, COPD and morbid obesity who presented to the emergency room with complaints of generalized weakness and reportedly had some syncope.  He had been to the urologist office on the morning of 6/30 for Foley catheter exchange.  Sometime after lunch, he was found down in the front yard and noted to be febrile and confused.  In the emergency room, work-up revealed multiple pulmonary emboli and initially mildly hypoxic although this soon resolved.  Also noted to have a urinary tract infection.  Admitted to the hospitalist service.  Seen by vascular surgery who did not recommend any intervention.  Echocardiogram stable.  Assessment and Plan: * Acute pulmonary embolism ([place]) Stable.  No hypoxia.  No need for intervention by vascular surgery.  Changed heparin over to Eliquis.  Echocardiogram unremarkable.  Patient stable will discharge on Eliquis which she will continue for 6 months.  Patient's pharmacy is closed on Sundays and so he received his morning dose of Eliquis as scheduled.  He is  going to be given his evening dose to take home in hand and take this evening and then told to present to the pharmacy when it opens at 8:30 in the morning and start his medication immediately.  He understands.  Essential hypertension - We will continue his antihypertensive while monitoring his BP. Please note that patient reportedly has a previous history of diastolic heart failure however every echocardiogram including 1 done on this hospitalization note normal ejection fraction and no evidence of diastolic dysfunction.  Patient does not have CHF.  Type 2 diabetes mellitus without complications (H[facility]) - We will place him on supplemental coverage with NovoLog. - We will hold off metformin. - We will continue his basal coverage.  Dyslipidemia - We will continue statin therapy.    Urinary tract infection UTI noted on urinalysis.  Given elevated procalcitonin and increasing white blood cell count and low-grade temps, received 2 days of IV Rocephin.  Urine culture positive for 100,000 colonies of Pseudomonas and 70,000 colonies of Enterococcus.  Sensitivities pending.  Have ordered 2 more days of Cipro.  We will watch for sensitivities and adjust antibiotics accordingly sepsis is ruled out as patient's tachycardia, tachypnea and fever could also be attributed to PE.    Chronic obstructive pulmonary disease (COPD) (H[facility]) - We will continue his inhalers while holding off long-acting beta agonist.  BPH (benign prostatic hyperplasia) - The patient is status post HoLEP procedure on 6/29 and has a chronic indwelling Foley catheter for urinary retention. - We will continue his Flomax.  Morbid obesity ([place]) Meets criteria with  BMI greater than 35 and comorbidity of hypertension         Consultants: Vascular surgery Procedures performed: Echocardiogram noting preserved ejection fraction and no diastolic dysfunction Disposition: Home Diet recommendation:  Discharge Diet Orders (From admission,  onward)     Start     Ordered   09/21/21 0000  Diet - low sodium heart healthy        09/21/21 1047           Cardiac diet DISCHARGE MEDICATION: Allergies as of 09/21/2021       Reactions   Farxiga [dapagliflozin] Rash   Causes a severe rash in groin area   Dulaglutide Diarrhea, Other (See Comments)   Liraglutide Diarrhea, Other (See Comments)   Jardiance [empagliflozin] Rash        Medication List     TAKE these medications    acyclovir 400 MG tablet Commonly known as: ZOVIRAX Take 400 mg by mouth 2 (two) times daily.   Apixaban Starter Pack ('10mg'$  and '5mg'$ ) Commonly known as: ELIQUIS STARTER PACK Take as directed on package: start with two-'5mg'$  tablets twice daily for 7 days. On day 8, switch to one-'5mg'$  tablet twice daily. Notes to patient: 09/21/21- 8pm   aspirin EC 81 MG tablet Take 1 tablet (81 mg total) by mouth daily. Swallow whole.   Breztri Aerosphere 160-9-4.8 MCG/ACT Aero Generic drug: Budeson-Glycopyrrol-Formoterol Inhale 1 puff into the lungs daily.   carvedilol 25 MG tablet Commonly known as: COREG TAKE ONE TABLET BY MOUTH TWO TIMES A DAY FOR BLOOD PRESSURE   ciprofloxacin 500 MG tablet Commonly known as: Cipro Take 1 tablet (500 mg total) by mouth 2 (two) times daily for 1 day. Start taking on: September 22, 2021 Notes to patient: Next doses- 09/22/21 8am and 8pm   cloNIDine 0.1 MG tablet Commonly known as: CATAPRES Take 0.1 mg by mouth 2 (two) times daily.   fenofibrate 145 MG tablet Commonly known as: Tricor Take 1 tablet (145 mg total) by mouth daily.   hydrALAZINE 50 MG tablet Commonly known as: APRESOLINE TAKE ONE TABLET BY MOUTH THREE TIMES A DAY FOR BLOOD PRESSURE   insulin glargine-yfgn 100 UNIT/ML Pen Commonly known as: SEMGLEE INJECT 10 UNITS UNDER SKIN ONCE EVERY DAY FOR DIABETES DISCARD PEN 28 DAYS AFTER OPENING. * GIVE AT THE SAME TIME DAILY *   losartan 100 MG tablet Commonly known as: COZAAR Take 100 mg by mouth daily.    magnesium oxide 400 (240 Mg) MG tablet Commonly known as: MAG-OX Take 1 tablet (400 mg total) by mouth daily.   metFORMIN 1000 MG tablet Commonly known as: GLUCOPHAGE Take 1 tablet (1,000 mg total) by mouth 2 (two) times daily with a meal.   nortriptyline 10 MG capsule Commonly known as: PAMELOR Take 10 mg by mouth 2 (two) times daily.   rosuvastatin 40 MG tablet Commonly known as: CRESTOR Take 1 tablet (40 mg total) by mouth at bedtime.   Spiriva Respimat 2.5 MCG/ACT Aers Generic drug: Tiotropium Bromide Monohydrate Inhale 2 puffs into the lungs daily.   tamsulosin 0.4 MG Caps capsule Commonly known as: FLOMAX Take 1 capsule (0.4 mg total) by mouth daily after supper.   Torsemide 40 MG Tabs Take 80 mg by mouth 2 (two) times daily.   vitamin B-12 500 MCG tablet Commonly known as: CYANOCOBALAMIN Take 2,000 mcg by mouth at bedtime.   Vitamin D (Cholecalciferol) 25 MCG (1000 UT) Caps Take 2,000 mcg by mouth at bedtime.  Discharge Exam: Filed Weights   09/19/21 1654  Weight: 120.7 kg   General: Alert and oriented x3, no acute distress Cardiovascular: Regular rate and rhythm, S1-S2 Lungs: Clear to auscultation bilaterally  Condition at discharge: good  The results of significant diagnostics from this hospitalization (including imaging, microbiology, ancillary and laboratory) are listed below for reference.   Imaging Studies: ECHOCARDIOGRAM COMPLETE  Result Date: 09/20/2021    ECHOCARDIOGRAM REPORT   Patient Name:   VONTRELL PULLMAN [facility] Date of Exam: 09/20/2021 Medical Rec #:  [id number]      Height:       70.0 in Accession #:    [id number]     Weight:       266.0 lb Date of Birth:  1940-05-16      BSA:          2.356 m Patient Age:    81 years       BP:           138/68 mmHg Patient Gender: M              HR:           64 bpm. Exam Location:  [facility] Procedure: 2D Echo, Cardiac Doppler and Color Doppler Indications:     I26.09  History:         Patient has prior history of  Echocardiogram examinations, most                  recent 05/20/2020. CHF, CAD, Cardiac stents, PAD and COPD; Risk                  Factors:Former Smoker, Hypertension, Diabetes, Dyslipidemia and                  Sleep Apnea.  Sonographer:     Rosalia Hammers Referring Phys:  [id number] [place] Diagnosing Phys: Isaias Cowman MD  Sonographer Comments: Technically challenging study due to limited acoustic windows. Image acquisition challenging due to patient body habitus, Image acquisition challenging due to COPD and Image acquisition challenging due to uncooperative patient. IMPRESSIONS  1. Left ventricular ejection fraction, by estimation, is 60 to 65%. The left ventricle has normal function. The left ventricle has no regional wall motion abnormalities. Left ventricular diastolic parameters were normal.  2. Right ventricular systolic function is normal. The right ventricular size is normal.  3. The mitral valve is normal in structure. Mild mitral valve regurgitation. No evidence of mitral stenosis.  4. The aortic valve is normal in structure. Aortic valve regurgitation is not visualized. Mild aortic valve stenosis.  5. The inferior vena cava is normal in size with greater than 50% respiratory variability, suggesting right atrial pressure of 3 mmHg. FINDINGS  Left Ventricle: Left ventricular ejection fraction, by estimation, is 60 to 65%. The left ventricle has normal function. The left ventricle has no regional wall motion abnormalities. The left ventricular internal cavity size was normal in size. There is  no left ventricular hypertrophy. Left ventricular diastolic parameters were normal. Right Ventricle: The right ventricular size is normal. No increase in right ventricular wall thickness. Right ventricular systolic function is normal. Left Atrium: Left atrial size was normal in size. Right Atrium: Right atrial size was normal in size. Pericardium: There is no evidence of pericardial effusion. Mitral Valve:  The mitral valve is normal in structure. Mild mitral valve regurgitation. No evidence of mitral valve stenosis. Tricuspid Valve: The tricuspid valve is normal in structure. Tricuspid valve regurgitation is  mild . No evidence of tricuspid stenosis. Aortic Valve: The aortic valve is normal in structure. Aortic valve regurgitation is not visualized. Mild aortic stenosis is present. Aortic valve mean gradient measures 13.0 mmHg. Aortic valve peak gradient measures 22.3 mmHg. Aortic valve area, by VTI measures 1.56 cm. Pulmonic Valve: The pulmonic valve was normal in structure. Pulmonic valve regurgitation is not visualized. No evidence of pulmonic stenosis. Aorta: The aortic root is normal in size and structure. Venous: The inferior vena cava is normal in size with greater than 50% respiratory variability, suggesting right atrial pressure of 3 mmHg. IAS/Shunts: No atrial level shunt detected by color flow Doppler.  LEFT VENTRICLE PLAX 2D LVIDd:         4.05 cm   Diastology LVIDs:         2.48 cm   LV e' medial:    6.31 cm/s LV PW:         1.57 cm   LV E/e' medial:  17.0 LV IVS:        1.72 cm   LV e' lateral:   7.40 cm/s LVOT diam:     1.90 cm   LV E/e' lateral: 14.5 LV SV:         79 LV SV Index:   33 LVOT Area:     2.84 cm  RIGHT VENTRICLE RV Basal diam:  3.90 cm RV S prime:     16.30 cm/s LEFT ATRIUM              Index        RIGHT ATRIUM           Index LA diam:        3.60 cm  1.53 cm/m   RA Area:     20.60 cm LA Vol (A2C):   80.1 ml  33.99 ml/m  RA Volume:   48.10 ml  20.41 ml/m LA Vol (A4C):   111.0 ml 47.11 ml/m LA Biplane Vol: 96.5 ml  40.95 ml/m  AORTIC VALVE AV Area (Vmax):    1.48 cm AV Area (Vmean):   1.47 cm AV Area (VTI):     1.56 cm AV Vmax:           236.00 cm/s AV Vmean:          171.000 cm/s AV VTI:            0.502 m AV Peak Grad:      22.3 mmHg AV Mean Grad:      13.0 mmHg LVOT Vmax:         123.00 cm/s LVOT Vmean:        88.600 cm/s LVOT VTI:          0.277 m LVOT/AV VTI ratio: 0.55   AORTA Ao Root diam: 3.60 cm MITRAL VALVE MV Area (PHT): 2.63 cm     SHUNTS MV Decel Time: 288 msec     Systemic VTI:  0.28 m MV E velocity: 107.00 cm/s  Systemic Diam: 1.90 cm MV A velocity: 101.00 cm/s MV E/A ratio:  1.06 Isaias Cowman MD Electronically signed by Isaias Cowman MD Signature Date/Time: 09/20/2021/1:32:01 PM    Final    US Venous Img Lower Bilateral (DVT)  Result Date: 09/19/2021 CLINICAL DATA:  Left lower extremity pain and edema. EXAM: BILATERAL LOWER EXTREMITY VENOUS DOPPLER ULTRASOUND TECHNIQUE: Gray-scale sonography with graded compression, as well as color Doppler and duplex ultrasound were performed to evaluate the lower extremity deep venous systems from the level  of the common femoral vein and including the common femoral, femoral, profunda femoral, popliteal and calf veins including the posterior tibial, peroneal and gastrocnemius veins when visible. The superficial great saphenous vein was also interrogated. Spectral Doppler was utilized to evaluate flow at rest and with distal augmentation maneuvers in the common femoral, femoral and popliteal veins. COMPARISON:  December 15, 2019 FINDINGS: RIGHT LOWER EXTREMITY Common Femoral Vein: No evidence of thrombus. Normal compressibility, respiratory phasicity and response to augmentation. Saphenofemoral Junction: No evidence of thrombus. Normal compressibility and flow on color Doppler imaging. Profunda Femoral Vein: No evidence of thrombus. Normal compressibility and flow on color Doppler imaging. Femoral Vein: No evidence of thrombus. Normal compressibility, respiratory phasicity and response to augmentation. Popliteal Vein: No evidence of thrombus. Normal compressibility, respiratory phasicity and response to augmentation. Calf Veins: No evidence of thrombus (only 1 peroneal vein is visualized on the RIGHT, as per the ultrasound technologist. Normal compressibility and flow on color Doppler imaging. Superficial Great Saphenous  Vein: No evidence of thrombus. Normal compressibility. Venous Reflux:  None. Other Findings:  None. LEFT LOWER EXTREMITY Common Femoral Vein: No evidence of thrombus. Normal compressibility, respiratory phasicity and response to augmentation. Saphenofemoral Junction: No evidence of thrombus. Normal compressibility and flow on color Doppler imaging. Profunda Femoral Vein: No evidence of thrombus. Normal compressibility and flow on color Doppler imaging. Femoral Vein: No evidence of thrombus. Normal compressibility, respiratory phasicity and response to augmentation. Popliteal Vein: No evidence of thrombus. Normal compressibility, respiratory phasicity and response to augmentation. Calf Veins: No evidence of thrombus. Normal compressibility and flow on color Doppler imaging. Superficial Great Saphenous Vein: No evidence of thrombus. Normal compressibility. Venous Reflux:  None. Other Findings:  None. IMPRESSION: No evidence of deep venous thrombosis in either lower extremity. Electronically Signed   By: Virgina Norfolk M.D.   On: 09/19/2021 21:37   CT Angio Chest PE W and/or Wo Contrast  Result Date: 09/19/2021 CLINICAL DATA:  per ems pt was found down in his driveway, states that he has probably been down since noon, pt has abrasions on bilat knees and a red rash on his abd. Pt arouses to verbal stim, pt states that he feel generally poorly, pt oriented to person, place and day of the week. EXAM: CT ANGIOGRAPHY CHEST CT ABDOMEN AND PELVIS WITH CONTRAST TECHNIQUE: Multidetector CT imaging of the chest was performed using the standard protocol during bolus administration of intravenous contrast. Multiplanar CT image reconstructions and MIPs were obtained to evaluate the vascular anatomy. Multidetector CT imaging of the abdomen and pelvis was performed using the standard protocol during bolus administration of intravenous contrast. RADIATION DOSE REDUCTION: This exam was performed according to the departmental  dose-optimization program which includes automated exposure control, adjustment of the mA and/or kV according to patient size and/or use of iterative reconstruction technique. CONTRAST:  103m OMNIPAQUE IOHEXOL 350 MG/ML SOLN COMPARISON:  03/29/2020, CT abdomen pelvis. Current chest radiograph. FINDINGS: CTA CHEST FINDINGS Cardiovascular: There are multiple bilateral pulmonary emboli. On the right, pulmonary emboli are noted extending from the upper lobe pulmonary artery into multiple segmental branches. They appear nonocclusive. There are segmental branch pulmonary emboli to the medial and lateral segments of the right middle lobe. Segmental pulmonary emboli are noted to all segments of the right lower lobe. On the left a nonocclusive embolus straddles the peripheral main pulmonary artery into the left upper lobe anterior and apical segmental branches and left upper lobe lingular branch. There are nonocclusive pulmonary emboli to the lower lobe  segmental branches. No large central pulmonary emboli, however. Pulmonary artery is dilated to 3.9 cm. RV LV ratio is 1.15, mildly elevated. Heart is top-normal in size. Three-vessel coronary artery calcifications. No pericardial effusion. Mild aortic atherosclerosis. No aneurysm or dissection. Mediastinum/Nodes: No neck base, mediastinal or hilar masses or enlarged lymph nodes. Trachea and esophagus are unremarkable. Lungs/Pleura: Subtle ground-glass opacities, most evident in the left upper lobe, likely due to vascular shunting. Linear atelectasis noted in the dependent lower lobes and lung bases. Two small pulmonary nodules, right lower lobe, image 103, series 3, 6 mm, and right upper lobe, image 78, series 3, 4 mm. No pleural effusion.  No pneumothorax. Musculoskeletal: No fracture or acute finding. No suspicious bone lesion. Review of the MIP images confirms the above findings. CT ABDOMEN and PELVIS FINDINGS Hepatobiliary: Decreased liver attenuation consistent with  fatty infiltration. No liver mass or focal lesion. Prior cholecystectomy. No bile duct dilation. Pancreas: Unremarkable. No pancreatic ductal dilatation or surrounding inflammatory changes. Spleen: Normal in size without focal abnormality. Adrenals/Urinary Tract: No adrenal mass. Kidneys normal in overall size, orientation and position. No renal mass, stone or hydronephrosis. Chronic bilateral perinephric stranding. Normal ureters. Bladder mostly decompressed. Wall is prominent. Stomach/Bowel: Normal stomach. Small bowel and colon are normal in caliber. No wall thickening. No inflammation. Sigmoid colon anastomosis staple line. Vascular/Lymphatic: Aortic atherosclerosis. No aneurysm. No enlarged lymph nodes. Reproductive: Enlarged prostate measuring 7.6 x 6.4 x 7.6 cm. Other: No abdominal wall hernia or abnormality. No abdominopelvic ascites. Musculoskeletal: No fracture or acute finding. No suspicious bone lesion. Review of the MIP images confirms the above findings. IMPRESSION: CTA CHEST 1. Positive for pulmonary emboli. There are multiple bilateral segmental pulmonary emboli. Positive for acute PE with CT evidence of right heart strain (RV/LV Ratio = 1.15) consistent with at least submassive (intermediate risk) PE. The presence of right heart strain has been associated with an increased risk of morbidity and mortality. Please refer to the "Code PE Focused" order set in EPIC. 2. Lungs show evidence of vascular shunting and atelectasis, but no evidence of an infarct, pleural effusion or pneumothorax. 3. Mild enlargement of the main pulmonary artery tp 3.9 cm. CT ABDOMEN AND PELVIS 1. No acute findings within the abdomen or pelvis. Electronically Signed   By: Lajean Manes M.D.   On: 09/19/2021 18:39   CT ABDOMEN PELVIS W CONTRAST  Result Date: 09/19/2021 CLINICAL DATA:  per ems pt was found down in his driveway, states that he has probably been down since noon, pt has abrasions on bilat knees and a red rash on  his abd. Pt arouses to verbal stim, pt states that he feel generally poorly, pt oriented to person, place and day of the week. EXAM: CT ANGIOGRAPHY CHEST CT ABDOMEN AND PELVIS WITH CONTRAST TECHNIQUE: Multidetector CT imaging of the chest was performed using the standard protocol during bolus administration of intravenous contrast. Multiplanar CT image reconstructions and MIPs were obtained to evaluate the vascular anatomy. Multidetector CT imaging of the abdomen and pelvis was performed using the standard protocol during bolus administration of intravenous contrast. RADIATION DOSE REDUCTION: This exam was performed according to the departmental dose-optimization program which includes automated exposure control, adjustment of the mA and/or kV according to patient size and/or use of iterative reconstruction technique. CONTRAST:  63m OMNIPAQUE IOHEXOL 350 MG/ML SOLN COMPARISON:  03/29/2020, CT abdomen pelvis. Current chest radiograph. FINDINGS: CTA CHEST FINDINGS Cardiovascular: There are multiple bilateral pulmonary emboli. On the right, pulmonary emboli are noted extending from  the upper lobe pulmonary artery into multiple segmental branches. They appear nonocclusive. There are segmental branch pulmonary emboli to the medial and lateral segments of the right middle lobe. Segmental pulmonary emboli are noted to all segments of the right lower lobe. On the left a nonocclusive embolus straddles the peripheral main pulmonary artery into the left upper lobe anterior and apical segmental branches and left upper lobe lingular branch. There are nonocclusive pulmonary emboli to the lower lobe segmental branches. No large central pulmonary emboli, however. Pulmonary artery is dilated to 3.9 cm. RV LV ratio is 1.15, mildly elevated. Heart is top-normal in size. Three-vessel coronary artery calcifications. No pericardial effusion. Mild aortic atherosclerosis. No aneurysm or dissection. Mediastinum/Nodes: No neck base,  mediastinal or hilar masses or enlarged lymph nodes. Trachea and esophagus are unremarkable. Lungs/Pleura: Subtle ground-glass opacities, most evident in the left upper lobe, likely due to vascular shunting. Linear atelectasis noted in the dependent lower lobes and lung bases. Two small pulmonary nodules, right lower lobe, image 103, series 3, 6 mm, and right upper lobe, image 78, series 3, 4 mm. No pleural effusion.  No pneumothorax. Musculoskeletal: No fracture or acute finding. No suspicious bone lesion. Review of the MIP images confirms the above findings. CT ABDOMEN and PELVIS FINDINGS Hepatobiliary: Decreased liver attenuation consistent with fatty infiltration. No liver mass or focal lesion. Prior cholecystectomy. No bile duct dilation. Pancreas: Unremarkable. No pancreatic ductal dilatation or surrounding inflammatory changes. Spleen: Normal in size without focal abnormality. Adrenals/Urinary Tract: No adrenal mass. Kidneys normal in overall size, orientation and position. No renal mass, stone or hydronephrosis. Chronic bilateral perinephric stranding. Normal ureters. Bladder mostly decompressed. Wall is prominent. Stomach/Bowel: Normal stomach. Small bowel and colon are normal in caliber. No wall thickening. No inflammation. Sigmoid colon anastomosis staple line. Vascular/Lymphatic: Aortic atherosclerosis. No aneurysm. No enlarged lymph nodes. Reproductive: Enlarged prostate measuring 7.6 x 6.4 x 7.6 cm. Other: No abdominal wall hernia or abnormality. No abdominopelvic ascites. Musculoskeletal: No fracture or acute finding. No suspicious bone lesion. Review of the MIP images confirms the above findings. IMPRESSION: CTA CHEST 1. Positive for pulmonary emboli. There are multiple bilateral segmental pulmonary emboli. Positive for acute PE with CT evidence of right heart strain (RV/LV Ratio = 1.15) consistent with at least submassive (intermediate risk) PE. The presence of right heart strain has been associated  with an increased risk of morbidity and mortality. Please refer to the "Code PE Focused" order set in EPIC. 2. Lungs show evidence of vascular shunting and atelectasis, but no evidence of an infarct, pleural effusion or pneumothorax. 3. Mild enlargement of the main pulmonary artery tp 3.9 cm. CT ABDOMEN AND PELVIS 1. No acute findings within the abdomen or pelvis. Electronically Signed   By: Lajean Manes M.D.   On: 09/19/2021 18:39   CT HEAD WO CONTRAST (5MM)  Result Date: 09/19/2021 CLINICAL DATA:  Found down, altered EXAM: CT HEAD WITHOUT CONTRAST TECHNIQUE: Contiguous axial images were obtained from the base of the skull through the vertex without intravenous contrast. RADIATION DOSE REDUCTION: This exam was performed according to the departmental dose-optimization program which includes automated exposure control, adjustment of the mA and/or kV according to patient size and/or use of iterative reconstruction technique. COMPARISON:  CT brain 05/18/2020, MRI 05/19/2020 FINDINGS: Brain: No hemorrhage or intracranial mass. Chronic left cerebellar infarct. Atrophy and fairly extensive chronic small vessel ischemic changes of the white matter. More focal hypodensity within the left white matter, for example series 2, image 24 and series  2, image 27. These are not clearly present on comparison CT from 2022. Ventricles are nonenlarged. Vascular: No hyperdense vessels.  Carotid vascular calcification Skull: Normal. Negative for fracture or focal lesion. Sinuses/Orbits: No acute finding. Other: None IMPRESSION: 1. Negative for acute intracranial hemorrhage or mass 2. Atrophy and extensive chronic small vessel ischemic changes of the white matter. Interval hypodensities within the left white matter since the prior exam, consistent with age indeterminate small infarcts. Old left cerebellar infarct Electronically Signed   By: Donavan Foil M.D.   On: 09/19/2021 18:25   DG Chest Port 1 View  Result Date:  09/19/2021 CLINICAL DATA:  Found down EXAM: PORTABLE CHEST 1 VIEW COMPARISON:  06/25/2021 FINDINGS: Stable cardiomediastinal contours. Aortic atherosclerosis. Low lung volumes. No focal airspace consolidation, pleural effusion, or pneumothorax. IMPRESSION: No active disease. Electronically Signed   By: Davina Poke D.O.   On: 09/19/2021 17:12    Microbiology: Results for orders placed or performed during the hospital encounter of 09/19/21  Blood Culture (routine x 2)     Status: None (Preliminary result)   Collection Time: 09/19/21  4:47 PM   Specimen: BLOOD  Result Value Ref Range Status   Specimen Description BLOOD BLOOD LEFT FOREARM  Final   Special Requests   Final    BOTTLES DRAWN AEROBIC AND ANAEROBIC Blood Culture adequate volume   Culture   Final    NO GROWTH 2 DAYS Performed at [facility], [address] [place], [place] [address]    Report Status PENDING  Incomplete  Blood Culture (routine x 2)     Status: None (Preliminary result)   Collection Time: 09/19/21  4:52 PM   Specimen: BLOOD  Result Value Ref Range Status   Specimen Description BLOOD BLOOD LEFT HAND  Final   Special Requests   Final    BOTTLES DRAWN AEROBIC AND ANAEROBIC Blood Culture results may not be optimal due to an inadequate volume of blood received in culture bottles   Culture   Final    NO GROWTH 2 DAYS Performed at [facility], [address] [place], [place] [address]    Report Status PENDING  Incomplete  Urine Culture     Status: Abnormal (Preliminary result)   Collection Time: 09/19/21  5:19 PM   Specimen: In/Out Cath Urine  Result Value Ref Range Status   Specimen Description   Final    IN/OUT CATH URINE Performed at [facility], [address], [place], [place] [address]    Special Requests   Final    NONE Performed at [facility], [address] [place], [place] [address]    Culture (A)  Final    >=100,000 COLONIES/mL PSEUDOMONAS  AERUGINOSA 70,000 COLONIES/mL ENTEROCOCCUS FAECALIS SUSCEPTIBILITIES TO FOLLOW Performed at [facility], [place] [address] [place], [place] [address]    Report Status PENDING  Incomplete    Labs: CBC: Recent Labs  Lab 09/15/21 2226 09/19/21 1650 09/20/21 0349 09/21/21 0705  WBC 9.8 13.5* 13.6* 9.9  NEUTROABS 7.9* 11.4*  --   --   HGB 13.2 12.2* 13.8 11.3*  HCT 40.2 36.0* 41.2 33.5*  MCV 90.5 89.3 89.8 88.9  PLT 210 230 245 474   Basic Metabolic Panel: Recent Labs  Lab 09/15/21 2226 09/19/21 1650 09/21/21 0705  NA 138 138 136  K 4.2 3.3* 2.8*  CL 102 104 98  CO2 '27 24 30  '$ GLUCOSE 213* 197* 124*  BUN 39* 16 15  CREATININE  2.57* 1.59* 1.54*  CALCIUM 9.6 9.3 9.2  MG  --  1.4*  --    Liver Function Tests: Recent Labs  Lab 09/15/21 2226 09/19/21 1650  AST 16 16  ALT 12 13  ALKPHOS 61 56  BILITOT 0.9 1.1  PROT 7.1 6.6  ALBUMIN 3.0* 2.9*   CBG: Recent Labs  Lab 09/20/21 1308 09/20/21 1722 09/20/21 2122 09/21/21 0755 09/21/21 1220  GLUCAP 180* 147* 190* 126* 171*    Discharge time spent: less than 30 minutes.  Signed: Annita Brod, MD [facility] 09/21/2021

## 2021-09-22 ENCOUNTER — Telehealth: Payer: Self-pay | Admitting: *Deleted

## 2021-09-22 LAB — URINE CULTURE: Culture: >=100000 — AB

## 2021-09-22 NOTE — Progress Notes (Signed)
Will be unable to do HOLEP for at least 6  months due to recent PE per Dr. Diamantina Providence.

## 2021-09-22 NOTE — Telephone Encounter (Signed)
Transition Care Management Unsuccessful Follow-up Telephone Call  Date of discharge and from where:  [facility]   Attempts:  1st Attempt  Reason for unsuccessful TCM follow-up call:  Left voice message

## 2021-09-24 LAB — CULTURE, BLOOD (ROUTINE X 2)
Culture: NO GROWTH
Culture: NO GROWTH
Special Requests: ADEQUATE

## 2021-09-24 NOTE — Telephone Encounter (Signed)
Transition Care Management Follow-up Telephone Call Date of discharge and from where: [facility] 09-21-2021 How have you been since you were released from the hospital? Feeling ok Any questions or concerns? Yes Concerns   Items Reviewed: Did the pt receive and understand the discharge instructions provided? Yes  Medications obtained and verified? Patient stated he spoke with pharmacy today and they will be ready after 1;00, he will be picking up today Other? No  Any new allergies since your discharge? No  Dietary orders reviewed? No Do you have support at home? No   Home Care and Equipment/Supplies: Were home health services ordered?  If so, what is the name of the agency?   Has the agency set up a time to come to the patient's home?  Were any new equipment or medical supplies ordered?   What is the name of the medical supply agency?  Were you able to get the supplies/equipment?  Do you have any questions related to the use of the equipment or supplies?   Functional Questionnaire: (I = Independent and D = Dependent) ADLs: I  Bathing/Dressing- I  Meal Prep- I  Eating- I  Maintaining continence- YES has a catheter in place for BPH from [facility] patient is waiting on phone call   Transferring/Ambulation- I i Managing Meds-I  Follow up appointments reviewed:  PCP Hospital f/u appt confirmed? No  Scheduled  PATIENT WAITING ON UROLOGY call then will schedule Specialist Hospital f/u appt confirm Are transportation arrangements needed? No  If their condition worsens, is the pt aware to call PCP or go to the Emergency Dept.? Yes Was the patient provided with contact information for the PCP's office or ED? Yes Was to pt encouraged to call back with questions or concerns? Yes

## 2021-09-30 ENCOUNTER — Emergency Department
Admission: EM | Admit: 2021-09-30 | Discharge: 2021-09-30 | Disposition: A | Discharge status: Home / Self Care | Payer: HMO | Attending: Emergency Medicine | Admitting: Emergency Medicine

## 2021-09-30 ENCOUNTER — Other Ambulatory Visit: Payer: Self-pay

## 2021-09-30 ENCOUNTER — Emergency Department: Payer: HMO

## 2021-09-30 DIAGNOSIS — R109 Unspecified abdominal pain: Secondary | ICD-10-CM | POA: Diagnosis not present

## 2021-09-30 DIAGNOSIS — I2510 Atherosclerotic heart disease of native coronary artery without angina pectoris: Secondary | ICD-10-CM | POA: Insufficient documentation

## 2021-09-30 DIAGNOSIS — R1030 Lower abdominal pain, unspecified: Secondary | ICD-10-CM | POA: Diagnosis present

## 2021-09-30 DIAGNOSIS — E1122 Type 2 diabetes mellitus with diabetic chronic kidney disease: Secondary | ICD-10-CM | POA: Insufficient documentation

## 2021-09-30 DIAGNOSIS — E1165 Type 2 diabetes mellitus with hyperglycemia: Secondary | ICD-10-CM | POA: Insufficient documentation

## 2021-09-30 DIAGNOSIS — Z7901 Long term (current) use of anticoagulants: Secondary | ICD-10-CM

## 2021-09-30 DIAGNOSIS — K760 Fatty (change of) liver, not elsewhere classified: Secondary | ICD-10-CM | POA: Diagnosis not present

## 2021-09-30 DIAGNOSIS — K5900 Constipation, unspecified: Secondary | ICD-10-CM | POA: Diagnosis not present

## 2021-09-30 DIAGNOSIS — N189 Chronic kidney disease, unspecified: Secondary | ICD-10-CM | POA: Diagnosis not present

## 2021-09-30 DIAGNOSIS — N400 Enlarged prostate without lower urinary tract symptoms: Secondary | ICD-10-CM | POA: Diagnosis not present

## 2021-09-30 DIAGNOSIS — J449 Chronic obstructive pulmonary disease, unspecified: Secondary | ICD-10-CM | POA: Insufficient documentation

## 2021-09-30 DIAGNOSIS — Z951 Presence of aortocoronary bypass graft: Secondary | ICD-10-CM | POA: Diagnosis not present

## 2021-09-30 DIAGNOSIS — I129 Hypertensive chronic kidney disease with stage 1 through stage 4 chronic kidney disease, or unspecified chronic kidney disease: Secondary | ICD-10-CM | POA: Diagnosis not present

## 2021-09-30 DIAGNOSIS — R739 Hyperglycemia, unspecified: Secondary | ICD-10-CM

## 2021-09-30 DIAGNOSIS — I700 Atherosclerosis of aorta: Secondary | ICD-10-CM

## 2021-09-30 DIAGNOSIS — I10 Essential (primary) hypertension: Secondary | ICD-10-CM

## 2021-09-30 LAB — COMPREHENSIVE METABOLIC PANEL
ALT: 13 U/L (ref 0–44)
AST: 14 U/L — ABNORMAL LOW (ref 15–41)
Albumin: 3.3 g/dL — ABNORMAL LOW (ref 3.5–5.0)
Alkaline Phosphatase: 71 U/L (ref 38–126)
Anion gap: 7 (ref 5–15)
BUN: 15 mg/dL (ref 8–23)
CO2: 26 mmol/L (ref 22–32)
Calcium: 10.3 mg/dL (ref 8.9–10.3)
Chloride: 102 mmol/L (ref 98–111)
Creatinine, Ser: 1.51 mg/dL — ABNORMAL HIGH (ref 0.61–1.24)
GFR, Estimated: 46 mL/min — ABNORMAL LOW (ref >60)
Glucose, Bld: 230 mg/dL — ABNORMAL HIGH (ref 70–99)
Potassium: 4.7 mmol/L (ref 3.5–5.1)
Sodium: 135 mmol/L (ref 135–145)
Total Bilirubin: 0.8 mg/dL (ref 0.3–1.2)
Total Protein: 7.9 g/dL (ref 6.5–8.1)

## 2021-09-30 LAB — CBC
HCT: 43.6 % (ref 39.0–52.0)
Hemoglobin: 14.3 g/dL (ref 13.0–17.0)
MCH: 29.2 pg (ref 26.0–34.0)
MCHC: 32.8 g/dL (ref 30.0–36.0)
MCV: 89.2 fL (ref 80.0–100.0)
Platelets: 338 10*3/uL (ref 150–400)
RBC: 4.89 MIL/uL (ref 4.22–5.81)
RDW: 12.6 % (ref 11.5–15.5)
WBC: 9.5 10*3/uL (ref 4.0–10.5)
nRBC: 0 % (ref 0.0–0.2)

## 2021-09-30 LAB — URINALYSIS, ROUTINE W REFLEX MICROSCOPIC
Bilirubin Urine: NEGATIVE
Glucose, UA: 50 mg/dL — AB
Ketones, ur: NEGATIVE mg/dL
Nitrite: POSITIVE — AB
Protein, ur: >=300 mg/dL — AB
Specific Gravity, Urine: 1.009 (ref 1.005–1.030)
Squamous Epithelial / HPF: NONE SEEN (ref 0–5)
pH: 6 (ref 5.0–8.0)

## 2021-09-30 LAB — LIPASE, BLOOD: Lipase: 21 U/L (ref 11–51)

## 2021-09-30 MED ORDER — IOHEXOL 300 MG/ML  SOLN
100.0000 mL | Freq: Once | INTRAMUSCULAR | Status: AC | PRN
  Administered 2021-09-30: 100 mL via INTRAVENOUS

## 2021-09-30 MED ORDER — HYDRALAZINE HCL 50 MG PO TABS
50.0000 mg | ORAL_TABLET | ORAL | Status: AC
  Administered 2021-09-30: 50 mg via ORAL
  Filled 2021-09-30: qty 1

## 2021-09-30 MED ORDER — LOSARTAN POTASSIUM 50 MG PO TABS
100.0000 mg | ORAL_TABLET | Freq: Every day | ORAL | Status: DC
  Administered 2021-09-30: 100 mg via ORAL
  Filled 2021-09-30: qty 2

## 2021-09-30 MED ORDER — CLONIDINE HCL 0.1 MG PO TABS
0.1000 mg | ORAL_TABLET | Freq: Two times a day (BID) | ORAL | Status: DC
  Administered 2021-09-30: 0.1 mg via ORAL
  Filled 2021-09-30 (×2): qty 1

## 2021-09-30 MED ORDER — CARVEDILOL 6.25 MG PO TABS
25.0000 mg | ORAL_TABLET | Freq: Two times a day (BID) | ORAL | Status: DC
  Administered 2021-09-30: 25 mg via ORAL
  Filled 2021-09-30: qty 4

## 2021-09-30 NOTE — Discharge Instructions (Addendum)
Your CT today showed: IMPRESSION: 1. No acute or focal lesion to explain the patient's abdominal pain. 2. Hepatic steatosis. 3. Cholecystectomy. 4. Enlarged prostate gland. 5. Chronic indwelling urinary catheter. 6. Aortic Atherosclerosis (ICD10-I70.0).  Bowel Cleanout: When you wake up: Begin a liquid diet. (See list below for suggestions.) Take 2 Dulcolax (bisacodyl) tablets. (DO NOT CHEW.) Begin MiraLAX on a daily basis per instructions 1 hour after waking up: Mix the entire 238-gram bottle of MiraLAX with 64 ounces of a sports drink. Drink all of the mixture over the next few hours until gone. (Suggestion: An 8-ounce glass every 15-30 minutes equals 2-4 hours.) It is very important to drink plenty of water and other liquids in order to avoid dehydration and to flush the bowel. (Although alcohol is a liquid, it can make you dehydrated. You should NOT drink alcohol while doing the cleanout.) NOTE: Please stay home once you have started your cleanout. Also, the use of moist towelettes or wipes may help to minimize discomfort during the cleanout. A nonprescription 1% hydrocortisone cream may also be soothing when applied to the rectal area after each bowel movement. It is common during the cleanout to experience some nausea, bloating, and/or abdominal distention. If you chilled the mixture prior to drinking it, you could experience chills from consuming so much cold liquid in a short time period. If you develop nausea or vomiting, slow down the rate at which you drink the solution. Please attempt to drink all of the laxative solution even if it takes you longer. Once stooling slows down, you may resume eating solid food.

## 2021-09-30 NOTE — ED Triage Notes (Signed)
Pt here with constipation and abd pain since yesterday. Pt denies N/V. Pt has tried laxatives and stool softeners with no relief.

## 2021-09-30 NOTE — ED Provider Notes (Signed)
[facility] Provider Note    Event Date/Time   First MD Initiated Contact with Patient 09/30/21 1450     (approximate)   History   Constipation and Abdominal Pain   HPI  Justin Griffith is a 81 y.o. male  with past medical history of BPH, CAD status post CABG, COPD and morbid obesit and recent admission following a syncopal episode discharged 7/2 after being   diagnosed with an acute PE discharged on Eliquis and UTI discharged on Cipro (since completed) who presents for evaluation of constipation and some crampy abdominal pain.  He states it has been about 4 days since he last had a bowel movement.  States he is passing gas when he strains hard.  He denies any nausea, vomiting, chest pain, cough, shortness of breath, headache or earache or sore throat or any other extremity pain.  States he has tried over-the-counter Dulcolax and fiber but this does not help.  He thinks his Foley may be putting a little less than usual but is not 100% sure.  He did not take any of his blood pressure medicines this morning.      Physical Exam  Triage Vital Signs: ED Triage Vitals  Enc Vitals Group     BP 09/30/21 1401 (!) 206/116     Pulse Rate 09/30/21 1401 (!) 108     Resp 09/30/21 1401 (!) 22     Temp 09/30/21 1401 97.9 F (36.6 C)     Temp Source 09/30/21 1401 Oral     SpO2 09/30/21 1401 95 %     Weight 09/30/21 1359 266 lb 1.5 oz (120.7 kg)     Height 09/30/21 1359 '5\' 10"'$  (1.778 m)     Head Circumference --      Peak Flow --      Pain Score 09/30/21 1359 5     Pain Loc --      Pain Edu? --      Excl. in [place]? --     Most recent vital signs: Vitals:   09/30/21 1540 09/30/21 1621  BP: (!) 198/115 (!) 141/75  Pulse: 88 69  Resp:  18  Temp:    SpO2:  93%    General: Awake, no distress.  CV:  Good peripheral perfusion.  2+ radial pulses.  Slightly tachycardic. Resp:  Normal effort.  Clear bilaterally. Abd:  No distention.  Some mild tenderness in lower  quadrants but otherwise soft.  No significant CVA tenderness. Other:  Yellow urine in Foley bag.   ED Results / Procedures / Treatments  Labs (all labs ordered are listed, but only abnormal results are displayed) Labs Reviewed  COMPREHENSIVE METABOLIC PANEL - Abnormal; Notable for the following components:      Result Value   Glucose, Bld 230 (*)    Creatinine, Ser 1.51 (*)    Albumin 3.3 (*)    AST 14 (*)    GFR, Estimated 46 (*)    All other components within normal limits  URINALYSIS, ROUTINE W REFLEX MICROSCOPIC - Abnormal; Notable for the following components:   Color, Urine YELLOW (*)    APPearance HAZY (*)    Glucose, UA 50 (*)    Hgb urine dipstick MODERATE (*)    Protein, ur >=300 (*)    Nitrite POSITIVE (*)    Leukocytes,Ua SMALL (*)    Bacteria, UA FEW (*)    All other components within normal limits  URINE CULTURE  LIPASE, BLOOD  CBC  EKG   RADIOLOGY  KUB ordered in triage shows significant stool burden on the right without pneumatosis or clear blockage.  CT abdomen pelvis on my interpretation without evidence of an ileus, SBO Mardene Celeste.  Significant stool burden.  There is no significant stranding around the bladder or kidneys.  I reviewed radiologist rotation and agree to findings of no acute process and evidence of hepatic steatosis and enlarged prostate as well as chronic Foley catheter and aortic atherosclerosis.   PROCEDURES:  Critical Care performed: No  Procedures   MEDICATIONS ORDERED IN ED: Medications  cloNIDine (CATAPRES) tablet 0.1 mg (0.1 mg Oral Given 09/30/21 1541)  carvedilol (COREG) tablet 25 mg (25 mg Oral Given 09/30/21 1541)  losartan (COZAAR) tablet 100 mg (100 mg Oral Given 09/30/21 1621)  hydrALAZINE (APRESOLINE) tablet 50 mg (50 mg Oral Given 09/30/21 1540)  iohexol (OMNIPAQUE) 300 MG/ML solution 100 mL (100 mLs Intravenous Contrast Given 09/30/21 1610)     IMPRESSION / MDM / ASSESSMENT AND PLAN / ED COURSE  I reviewed the  triage vital signs and the nursing notes. Patient's presentation is most consistent with acute presentation with potential threat to life or bodily function.                               Differential diagnosis includes, but is not limited to symptomatic constipation, ileus, SBO, UTI and other metabolic derangements.  He is not on any opioids.  He is already tried over-the-counter medications.  Lipase WNL not suggestive of pancreatitis.  CMP shows hyperglycemia with a glucose of 230 stable kidney function with a creatinine of 1.51 compared to 1.549 days ago without any other significant electrolyte or metabolic derangements.  Advised patient to make sure he is taking his diabetes medicines and have his blood sugar rechecked at his PCP follow-up visit.  CBC without leukocytosis or acute anemia and normal platelets.  Urinalysis ordered in triage.  Foley appears infected I suspect this is a chronic colonization with lower suspicion for clinically significant UTI given absence of any fevers, leukocytosis or stranding around the bladder or any significant back pain or evidence of pyelonephritis on CT.  KUB ordered in triage shows significant stool burden on the right without pneumatosis or clear blockage.  CT abdomen pelvis on my interpretation without evidence of an ileus, SBO Mardene Celeste.  Significant stool burden.  There is no significant stranding around the bladder or kidneys.  I reviewed radiologist rotation and agree to findings of no acute process and evidence of hepatic steatosis and enlarged prostate as well as chronic Foley catheter and aortic atherosclerosis.  I suspect symptomatic constipation.  We will start patient on MiraLAX cleanout regimen.  Discussed importance of close outpatient follow-up and taking his medications as directed.  Discharged in stable condition.  Strict return precautions advised and discussed.       FINAL CLINICAL IMPRESSION(S) / ED DIAGNOSES   Final diagnoses:   Hypertension, unspecified type  Hyperglycemia  Chronic kidney disease, unspecified CKD stage  Anticoagulated  Constipation, unspecified constipation type  Aortic atherosclerosis (HCC)  Hepatic steatosis     Rx / DC Orders   ED Discharge Orders     None        Note:  This document was prepared using Dragon voice recognition software and may include unintentional dictation errors.   Lucrezia Starch, MD 09/30/21 [phone]

## 2021-10-02 LAB — URINE CULTURE: Culture: >=100000 — AB

## 2021-10-03 ENCOUNTER — Ambulatory Visit: Payer: Self-pay

## 2021-10-03 ENCOUNTER — Telehealth: Payer: HMO | Admitting: Internal Medicine

## 2021-10-03 NOTE — Telephone Encounter (Signed)
Justin Griffith Nurse case manager from [facility] is calling to speak to a nurse. Pt went to the ED and patient is short of breathe. [name] was transferred to NT

## 2021-10-03 NOTE — Telephone Encounter (Signed)
    Doreen Salvage with [employer] called to report she had talked with pt. On the phone earlier and "he sounded really SOB." Currently not with pt. Telephoned pt. And he states he is not SOB. "I have neuropathy and was helping my wife in the kitchen and I was hurting and had to sit down. I don't get SOB." Pt. Has an appointment 10/06/21. Instructed to call as needed.  Answer Assessment - Initial Assessment Questions 1. RESPIRATORY STATUS: "Describe your breathing?" (e.g., wheezing, shortness of breath, unable to speak, severe coughing)      SOB 2. ONSET: "When did this breathing problem begin?"      No problems 3. PATTERN "Does the difficult breathing come and go, or has it been constant since it started?"      N/a 4. SEVERITY: "How bad is your breathing?" (e.g., mild, moderate, severe)    - MILD: No SOB at rest, mild SOB with walking, speaks normally in sentences, can lie down, no retractions, pulse < 100.    - MODERATE: SOB at rest, SOB with minimal exertion and prefers to sit, cannot lie down flat, speaks in phrases, mild retractions, audible wheezing, pulse 100-120.    - SEVERE: Very SOB at rest, speaks in single words, struggling to breathe, sitting hunched forward, retractions, pulse > 120      N/a 5. RECURRENT SYMPTOM: "Have you had difficulty breathing before?" If Yes, ask: "When was the last time?" and "What happened that time?"      No 6. CARDIAC HISTORY: "Do you have any history of heart disease?" (e.g., heart attack, angina, bypass surgery, angioplasty)      Yes 7. LUNG HISTORY: "Do you have any history of lung disease?"  (e.g., pulmonary embolus, asthma, emphysema)     No 8. CAUSE: "What do you think is causing the breathing problem?"      N/a 9. OTHER SYMPTOMS: "Do you have any other symptoms? (e.g., dizziness, runny nose, cough, chest pain, fever)     No 10. O2 SATURATION MONITOR:  "Do you use an oxygen saturation monitor (pulse oximeter) at home?" If Yes, "What is  your reading (oxygen level) today?" "What is your usual oxygen saturation reading?" (e.g., 95%)       No 11. PREGNANCY: "Is there any chance you are pregnant?" "When was your last menstrual period?"       N/a 12. TRAVEL: "Have you traveled out of the country in the last month?" (e.g., travel history, exposures)       No  Protocols used: Breathing Difficulty-A-AH

## 2021-10-06 ENCOUNTER — Ambulatory Visit (INDEPENDENT_AMBULATORY_CARE_PROVIDER_SITE_OTHER): Payer: HMO | Admitting: Physician Assistant

## 2021-10-06 ENCOUNTER — Encounter: Payer: Self-pay | Admitting: Physician Assistant

## 2021-10-06 VITALS — BP 191/110 | HR 79 | Temp 97.9°F | Wt 250.8 lb

## 2021-10-06 DIAGNOSIS — N138 Other obstructive and reflux uropathy: Secondary | ICD-10-CM

## 2021-10-06 DIAGNOSIS — K5900 Constipation, unspecified: Secondary | ICD-10-CM | POA: Diagnosis not present

## 2021-10-06 DIAGNOSIS — N3289 Other specified disorders of bladder: Secondary | ICD-10-CM

## 2021-10-06 DIAGNOSIS — N401 Enlarged prostate with lower urinary tract symptoms: Secondary | ICD-10-CM | POA: Diagnosis not present

## 2021-10-06 DIAGNOSIS — Z86711 Personal history of pulmonary embolism: Secondary | ICD-10-CM | POA: Diagnosis not present

## 2021-10-06 DIAGNOSIS — N390 Urinary tract infection, site not specified: Secondary | ICD-10-CM | POA: Diagnosis not present

## 2021-10-06 MED ORDER — APIXABAN 5 MG PO TABS: 5.0000 mg | ORAL_TABLET | Freq: Two times a day (BID) | ORAL | 1 refills | Status: DC

## 2021-10-06 MED ORDER — MIRABEGRON ER 50 MG PO TB24: 50.0000 mg | ORAL_TABLET | Freq: Every day | ORAL | 0 refills | Status: DC

## 2021-10-06 MED ORDER — NITROFURANTOIN MONOHYD MACRO 100 MG PO CAPS: 100.0000 mg | ORAL_CAPSULE | Freq: Two times a day (BID) | ORAL | 0 refills | Status: DC

## 2021-10-06 NOTE — Assessment & Plan Note (Signed)
Patient was admitted to hospital on June 30th 2023 for PE  Chart review does not demonstrate follow up with PCP or Pulm/ Cardiology  Will send refill of Eliquis to continue anticoagulant therapy  He has apt with Cardiology tomorrow- Discussed apt along with HTN medications and I recommend he take his meds with bottles to apt to discern refill status and regimen from here.  Follow up in 2 weeks for monitoring

## 2021-10-06 NOTE — Progress Notes (Signed)
Established Patient Office Visit  Name: Justin Griffith   MRN: [id number]    DOB: [birth year]   Date:10/06/2021  Today's Provider: Talitha Givens, MHS, PA-C Introduced myself to the patient as a PA-C and provided education on APPs in clinical practice.         Subjective  Chief Complaint  Chief Complaint  Patient presents with   ER Follow Up    Pt states he was seen at the ER for constipation and abdominal pain. States he has tried Dulcolax and was given Miralax at the ER, states this did not help. States he seen Urology this morning and had his catheter changed.    Diabetes    Eye exam requested from Dr. Ellin Mayhew    HPI  States he is feeling run down and tired  States he was given a medication to mix with gatorade to help with constipation- tried this on Friday 10/03/2021 and only had flatulence  Reports he has not had a bowel movement in 2 weeks  States he has the urge to have a bowel movement but is not able to pass anything    PE He is still taking Eliquis as directed and has a follow up with [name] tomorrow.   Patient Active Problem List   Diagnosis Date Noted   Pulmonary emboli ([place]) 09/20/2021   Acute pulmonary embolism ([place]) 09/19/2021   Essential hypertension 09/19/2021   Type 2 diabetes mellitus without complications ([place]) 77/82/4235   Dyslipidemia 09/19/2021   Hyperlipidemia 09/11/2021   Cannot urinate 09/11/2021   Urinary tract infection without hematuria 07/08/2021   Acute renal failure ([place]) 07/08/2021   Generalized weakness 06/25/2021   Lymphedema 03/17/2021   Headache disorder 02/27/2021   Falls frequently 11/28/2020   Weakness of lower extremity 11/28/2020   Aortic atherosclerosis ([place]) 36/14/4315   Embolic stroke ([place]) 40/10/6759   AKI (acute kidney injury) ([place]) 05/18/2020   Leukocytosis 05/16/2020   Hypercalcemia 05/16/2020   Acute on chronic diastolic CHF (congestive heart failure) ([place]) 05/16/2020   At high risk for falls 05/08/2020    History of cold sores 05/06/2020   Hypomagnesemia 03/29/2020   Urinary tract infection 03/29/2020   NSTEMI (non-ST elevated myocardial infarction) ([place]) 03/28/2020   Hypokalemia 03/28/2020   Elevated TSH 02/25/2020   Poor mobility 02/02/2020   Coronary artery disease 01/23/2020   History of CVA (cerebrovascular accident) 01/23/2020   Memory changes 01/01/2020   Chronic venous insufficiency 05/23/2019   B12 deficiency 04/23/2019   Constipation 04/21/2019   Tremor 11/14/2018   Morbid obesity ([place]) 09/07/2017   Chronic obstructive pulmonary disease (COPD) ([place]) 04/29/2017   Intertrigo 02/02/2017   BPH (benign prostatic hyperplasia) 07/23/2016   Advanced care planning/counseling discussion 95/11/3265   Eosinophilic esophagitis 12/45/8099   PAD (peripheral artery disease) ([place]) 01/08/2016   Gastroesophageal reflux disease without esophagitis 01/06/2016   Hyperlipidemia associated with type 2 diabetes mellitus ([place]) 01/16/2015   Sleep apnea 01/16/2015   BMI 40.0-44.9, adult ([place]) 01/16/2015   Hypertension associated with diabetes ([place]) 10/11/2014   Type 2 diabetes mellitus with proteinuria ([place]) 10/11/2014   Back pain 10/11/2014   History of colon cancer     Past Surgical History:  Procedure Laterality Date   CARDIAC CATHETERIZATION Left 09/25/2015   Procedure: Left Heart Cath and Coronary Angiography;  Surgeon: Yolonda Kida, MD;  Location: [facility];  Service: Cardiovascular;  Laterality: Left;   CHOLECYSTECTOMY  1970   COLON SURGERY  07-16-99   sigmoid colon resection with primary anastomosis, chemotherapy for metastatic disease   COLONOSCOPY  2001, 2012   Dr Bary Castilla, tubular adenoma of the cecum and ascending colon in 2012.   COLONOSCOPY WITH PROPOFOL N/A 02/12/2016   Procedure: COLONOSCOPY WITH PROPOFOL;  Surgeon: Robert Bellow, MD;  Location: [name];  Service: Endoscopy;  Laterality: N/A;   CORONARY STENT INTERVENTION N/A 05/17/2020   Procedure:  CORONARY STENT INTERVENTION;  Surgeon: Yolonda Kida, MD;  Location: [facility];  Service: Cardiovascular;  Laterality: N/A;  LAD   ESOPHAGOGASTRODUODENOSCOPY (EGD) WITH PROPOFOL N/A 02/12/2016   Procedure: ESOPHAGOGASTRODUODENOSCOPY (EGD) WITH PROPOFOL;  Surgeon: Robert Bellow, MD;  Location: [facility] ENDOSCOPY;  Service: Endoscopy;  Laterality: N/A;   HERNIA REPAIR Right    right inguinal hernia repair   JOINT REPLACEMENT Bilateral    knee replacement   KNEE SURGERY Bilateral 2010   LEFT HEART CATH AND CORONARY ANGIOGRAPHY N/A 05/17/2020   Procedure: LEFT HEART CATH AND CORONARY ANGIOGRAPHY possible PCI and stent;  Surgeon: Yolonda Kida, MD;  Location: [facility];  Service: Cardiovascular;  Laterality: N/A;   MYRINGOTOMY WITH TUBE PLACEMENT Bilateral 08/16/2014   Procedure: MYRINGOTOMY WITH TUBE PLACEMENT;  Surgeon: Carloyn Manner, MD;  Location: [facility] OR[facility];  Service: ENT;  Laterality: Bilateral;   PERIPHERAL VASCULAR CATHETERIZATION Right 09/04/2015   Procedure: Lower Extremity Angiography;  Surgeon: Katha Cabal, MD;  Location: [facility];  Service: Cardiovascular;  Laterality: Right;   PERIPHERAL VASCULAR CATHETERIZATION  09/04/2015   Procedure: Lower Extremity Intervention;  Surgeon: Katha Cabal, MD;  Location: [place] [facility];  Service: Cardiovascular;;   TONSILLECTOMY     TYMPANOSTOMY TUBE PLACEMENT      Family History  Problem Relation Age of Onset   Diabetes Father    Esophageal cancer Mother    Alzheimer's disease Paternal Uncle     Social History   Tobacco Use   Smoking status: Former    Packs/day: 1.00    Years: 30.00    Total pack years: 30.00    Types: Cigarettes    Quit date: 03/23/1989    Years since quitting: 32.5    Passive exposure: Past   Smokeless tobacco: Never  Substance Use Topics   Alcohol use: No    Alcohol/week: 0.0 standard drinks of alcohol     Current Outpatient Medications:    APIXABAN  (ELIQUIS) VTE STARTER PACK (10MG AND 5MG), Take as directed on package: start with two-58m tablets twice daily for 7 days. On day 8, switch to one-532mtablet twice daily., Disp: 1 each, Rfl: 0   aspirin EC 81 MG EC tablet, Take 1 tablet (81 mg total) by mouth daily. Swallow whole., Disp: 30 tablet, Rfl: 0   BREZTRI AEROSPHERE 160-9-4.8 MCG/ACT AERO, Inhale 1 puff into the lungs daily., Disp: , Rfl:    carvedilol (COREG) 25 MG tablet, TAKE ONE TABLET BY MOUTH TWO TIMES A DAY FOR BLOOD PRESSURE, Disp: , Rfl:    cloNIDine (CATAPRES) 0.1 MG tablet, Take 0.1 mg by mouth 2 (two) times daily., Disp: , Rfl:    fenofibrate (TRICOR) 145 MG tablet, Take 1 tablet (145 mg total) by mouth daily., Disp: 90 tablet, Rfl: 1   hydrALAZINE (APRESOLINE) 50 MG tablet, TAKE ONE TABLET BY MOUTH THREE TIMES A DAY FOR BLOOD PRESSURE, Disp: , Rfl:    insulin glargine-yfgn (SEMGLEE) 100 UNIT/ML Pen, INJECT 10 UNITS UNDER SKIN ONCE EVERY DAY FOR DIABETES DISCARD PEN  28 DAYS AFTER OPENING. * GIVE AT THE SAME TIME DAILY *, Disp: , Rfl:    losartan (COZAAR) 100 MG tablet, Take 100 mg by mouth daily., Disp: , Rfl:    magnesium oxide (MAG-OX) 400 (240 Mg) MG tablet, Take 1 tablet (400 mg total) by mouth daily., Disp: 90 tablet, Rfl: 0   metFORMIN (GLUCOPHAGE) 1000 MG tablet, Take 1 tablet (1,000 mg total) by mouth 2 (two) times daily with a meal., Disp: 180 tablet, Rfl: 3   mirabegron ER (MYRBETRIQ) 50 MG TB24 tablet, Take 1 tablet (50 mg total) by mouth daily., Disp: 28 tablet, Rfl: 0   nortriptyline (PAMELOR) 10 MG capsule, Take 10 mg by mouth 2 (two) times daily., Disp: , Rfl:    rosuvastatin (CRESTOR) 40 MG tablet, Take 1 tablet (40 mg total) by mouth at bedtime., Disp: 90 tablet, Rfl: 3   Tiotropium Bromide Monohydrate (SPIRIVA RESPIMAT) 2.5 MCG/ACT AERS, Inhale 2 puffs into the lungs daily., Disp: 1 g, Rfl: 3   Torsemide 40 MG TABS, Take 80 mg by mouth 2 (two) times daily., Disp: , Rfl:    vitamin B-12 (CYANOCOBALAMIN) 500 MCG  tablet, Take 2,000 mcg by mouth at bedtime., Disp: , Rfl:    Vitamin D, Cholecalciferol, 25 MCG (1000 UT) CAPS, Take 2,000 mcg by mouth at bedtime., Disp: , Rfl:   Allergies  Allergen Reactions   Farxiga [Dapagliflozin] Rash    Causes a severe rash in groin area    Dulaglutide Diarrhea and Other (See Comments)   Liraglutide Diarrhea and Other (See Comments)   Jardiance [Empagliflozin] Rash    I personally reviewed active problem list, medication list, allergies, notes from last encounter, lab results with the patient/caregiver today.   Review of Systems  Constitutional:  Positive for diaphoresis. Negative for chills and fever.  Respiratory:  Negative for shortness of breath and wheezing.   Cardiovascular:  Negative for chest pain and leg swelling.  Gastrointestinal:  Positive for constipation. Negative for nausea and vomiting.  Genitourinary:  Negative for dysuria.      Objective  Vitals:   10/06/21 1345 10/06/21 1354  BP: (!) 222/108 (!) 191/110  Pulse: 87 79  Temp: 97.9 F (36.6 C)   TempSrc: Oral   SpO2: 95%   Weight: 250 lb 12.8 oz (113.8 kg)     Body mass index is 35.99 kg/m.  Physical Exam Vitals reviewed.  Constitutional:      Appearance: Normal appearance. He is obese.  HENT:     Head: Normocephalic and atraumatic.     Right Ear: Decreased hearing noted.     Left Ear: Decreased hearing noted.  Cardiovascular:     Rate and Rhythm: Normal rate and regular rhythm.     Pulses: Normal pulses.          Radial pulses are 2+ on the right side and 2+ on the left side.     Heart sounds: Murmur heard.  Pulmonary:     Effort: Pulmonary effort is normal.     Breath sounds: Decreased air movement present. No decreased breath sounds, wheezing, rhonchi or rales.  Musculoskeletal:     Cervical back: Normal range of motion and neck supple.  Neurological:     Mental Status: He is alert.  Psychiatric:        Attention and Perception: Attention normal.        Mood  and Affect: Mood normal.        Behavior: Behavior normal. Behavior is cooperative.  Recent Results (from the past 2160 hour(s))  Bayer DCA Hb A1c Waived     Status: Abnormal   Collection Time: 09/05/21 11:05 AM  Result Value Ref Range   HB A1C (BAYER DCA - WAIVED) 8.7 (H) 4.8 - 5.6 %    Comment:          Prediabetes: 5.7 - 6.4          Diabetes: >6.4          Glycemic control for adults with diabetes: <7.0   Lipid panel     Status: Abnormal   Collection Time: 09/05/21  1:47 PM  Result Value Ref Range   Cholesterol, Total 255 (H) 100 - 199 mg/dL   Triglycerides 615 (HH) 0 - 149 mg/dL   HDL 30 (L) >39 mg/dL   VLDL Cholesterol Cal 109 (H) 5 - 40 mg/dL   LDL Chol Calc (NIH) 116 (H) 0 - 99 mg/dL   Chol/HDL Ratio 8.5 (H) 0.0 - 5.0 ratio    Comment:                                   T. Chol/HDL Ratio                                             Men  Women                               1/2 Avg.Risk  3.4    3.3                                   Avg.Risk  5.0    4.4                                2X Avg.Risk  9.6    7.1                                3X Avg.Risk 23.4   73.2   Basic metabolic panel     Status: Abnormal   Collection Time: 09/05/21  1:47 PM  Result Value Ref Range   Glucose 229 (H) 70 - 99 mg/dL   BUN 18 8 - 27 mg/dL   Creatinine, Ser 1.18 0.76 - 1.27 mg/dL   eGFR 62 >59 mL/min/1.73   BUN/Creatinine Ratio 15 10 - 24   Sodium 139 134 - 144 mmol/L   Potassium 4.9 3.5 - 5.2 mmol/L   Chloride 99 96 - 106 mmol/L   CO2 19 (L) 20 - 29 mmol/L   Calcium 10.4 (H) 8.6 - 10.2 mg/dL  CBC     Status: None   Collection Time: 09/11/21  1:00 PM  Result Value Ref Range   WBC 6.1 4.0 - 10.5 K/uL   RBC 4.61 4.22 - 5.81 MIL/uL   Hemoglobin 13.8 13.0 - 17.0 g/dL   HCT 41.9 39.0 - 52.0 %   MCV 90.9 80.0 - 100.0 fL   MCH 29.9 26.0 - 34.0 pg   MCHC 32.9 30.0 - 36.0 g/dL   RDW  12.8 11.5 - 15.5 %   Platelets 224 150 - 400 K/uL   nRBC 0.0 0.0 - 0.2 %    Comment: Performed at  [facility], [address] [place], [place] [address]  Basic metabolic panel     Status: Abnormal   Collection Time: 09/11/21  1:00 PM  Result Value Ref Range   Sodium 138 135 - 145 mmol/L   Potassium 3.7 3.5 - 5.1 mmol/L   Chloride 104 98 - 111 mmol/L   CO2 25 22 - 32 mmol/L   Glucose, Bld 237 (H) 70 - 99 mg/dL    Comment: Glucose reference range applies only to samples taken after fasting for at least 8 hours.   BUN 33 (H) 8 - 23 mg/dL   Creatinine, Ser 2.87 (H) 0.61 - 1.24 mg/dL   Calcium 10.0 8.9 - 10.3 mg/dL   GFR, Estimated 21 (L) >60 mL/min    Comment: (NOTE) Calculated using the CKD-EPI Creatinine Equation (2021)    Anion gap 9 5 - 15    Comment: Performed at [facility], [address] [place], [place] [address]  Urinalysis, Routine w reflex microscopic     Status: Abnormal   Collection Time: 09/11/21  1:00 PM  Result Value Ref Range   Color, Urine YELLOW (A) YELLOW   APPearance CLOUDY (A) CLEAR   Specific Gravity, Urine 1.015 1.005 - 1.030   pH 5.0 5.0 - 8.0   Glucose, UA 150 (A) NEGATIVE mg/dL   Hgb urine dipstick NEGATIVE NEGATIVE   Bilirubin Urine NEGATIVE NEGATIVE   Ketones, ur NEGATIVE NEGATIVE mg/dL   Protein, ur >=300 (A) NEGATIVE mg/dL   Nitrite NEGATIVE NEGATIVE   Leukocytes,Ua TRACE (A) NEGATIVE   RBC / HPF 6-10 0 - 5 RBC/hpf   WBC, UA 11-20 0 - 5 WBC/hpf   Bacteria, UA MANY (A) NONE SEEN   Squamous Epithelial / LPF 0-5 0 - 5   WBC Clumps PRESENT    Mucus PRESENT    Hyaline Casts, UA PRESENT     Comment: Performed at [facility], [address] [place], [place] [address]  Urine Culture     Status: None   Collection Time: 09/11/21  2:43 PM   Specimen: Urine, Random  Result Value Ref Range   Specimen Description      URINE, RANDOM Performed at [facility], [address] [place], [place] [address]    Special Requests      NONE Performed at [facility], [address] [place], [place]  [address]    Culture      NO GROWTH Performed at [facility], [place] [address] [place], [place] [address]    Report Status 09/12/2021 FINAL   Urinalysis, Routine w reflex microscopic Urine, Catheterized     Status: Abnormal   Collection Time: 09/15/21  9:01 PM  Result Value Ref Range   Color, Urine YELLOW (A) YELLOW   APPearance CLOUDY (A) CLEAR   Specific Gravity, Urine 1.013 1.005 - 1.030   pH 5.0 5.0 - 8.0   Glucose, UA 50 (A) NEGATIVE mg/dL   Hgb urine dipstick LARGE (A) NEGATIVE   Bilirubin Urine NEGATIVE NEGATIVE   Ketones, ur NEGATIVE NEGATIVE mg/dL   Protein, ur 100 (A) NEGATIVE mg/dL   Nitrite NEGATIVE NEGATIVE   Leukocytes,Ua NEGATIVE NEGATIVE   RBC / HPF >50 (H) 0 - 5 RBC/hpf   WBC, UA 11-20 0 - 5 WBC/hpf   Bacteria, UA RARE (  A) NONE SEEN   Squamous Epithelial / LPF NONE SEEN 0 - 5   WBC Clumps PRESENT    Mucus PRESENT     Comment: Performed at [facility], [address] [place], [place] [address]  Comprehensive metabolic panel     Status: Abnormal   Collection Time: 09/15/21 10:26 PM  Result Value Ref Range   Sodium 138 135 - 145 mmol/L   Potassium 4.2 3.5 - 5.1 mmol/L   Chloride 102 98 - 111 mmol/L   CO2 27 22 - 32 mmol/L   Glucose, Bld 213 (H) 70 - 99 mg/dL    Comment: Glucose reference range applies only to samples taken after fasting for at least 8 hours.   BUN 39 (H) 8 - 23 mg/dL   Creatinine, Ser 2.57 (H) 0.61 - 1.24 mg/dL   Calcium 9.6 8.9 - 10.3 mg/dL   Total Protein 7.1 6.5 - 8.1 g/dL   Albumin 3.0 (L) 3.5 - 5.0 g/dL   AST 16 15 - 41 U/L   ALT 12 0 - 44 U/L   Alkaline Phosphatase 61 38 - 126 U/L   Total Bilirubin 0.9 0.3 - 1.2 mg/dL   GFR, Estimated 24 (L) >60 mL/min    Comment: (NOTE) Calculated using the CKD-EPI Creatinine Equation (2021)    Anion gap 9 5 - 15    Comment: Performed at [facility], [address] [place], [place] [address]  CBC with Differential     Status: Abnormal   Collection Time: 09/15/21 10:26  PM  Result Value Ref Range   WBC 9.8 4.0 - 10.5 K/uL   RBC 4.44 4.22 - 5.81 MIL/uL   Hemoglobin 13.2 13.0 - 17.0 g/dL   HCT 40.2 39.0 - 52.0 %   MCV 90.5 80.0 - 100.0 fL   MCH 29.7 26.0 - 34.0 pg   MCHC 32.8 30.0 - 36.0 g/dL   RDW 12.3 11.5 - 15.5 %   Platelets 210 150 - 400 K/uL   nRBC 0.0 0.0 - 0.2 %   Neutrophils Relative % 79 %   Neutro Abs 7.9 (H) 1.7 - 7.7 K/uL   Lymphocytes Relative 10 %   Lymphs Abs 1.0 0.7 - 4.0 K/uL   Monocytes Relative 8 %   Monocytes Absolute 0.8 0.1 - 1.0 K/uL   Eosinophils Relative 1 %   Eosinophils Absolute 0.1 0.0 - 0.5 K/uL   Basophils Relative 1 %   Basophils Absolute 0.1 0.0 - 0.1 K/uL   Immature Granulocytes 1 %   Abs Immature Granulocytes 0.05 0.00 - 0.07 K/uL    Comment: Performed at [facility], [address] [place], [place] [address]  BLADDER SCAN AMB NON-IMAGING     Status: None   Collection Time: 09/18/21  1:42 PM  Result Value Ref Range   Scan Result 458m   Urinalysis, Complete     Status: Abnormal   Collection Time: 09/18/21  2:21 PM  Result Value Ref Range   Specific Gravity, UA 1.015 1.005 - 1.030   pH, UA 6.0 5.0 - 7.5   Color, UA Yellow Yellow   Appearance Ur Cloudy (A) Clear   Leukocytes,UA 1+ (A) Negative   Protein,UA 3+ (A) Negative/Trace   Glucose, UA Trace (A) Negative   Ketones, UA Negative Negative   RBC, UA 3+ (A) Negative   Bilirubin, UA Negative Negative   Urobilinogen, Ur 0.2 0.2 - 1.0 mg/dL   Nitrite, UA Positive (A) Negative   Microscopic Examination  See below:   CULTURE, URINE COMPREHENSIVE     Status: Abnormal   Collection Time: 09/18/21  2:21 PM   Specimen: Urine   UR  Result Value Ref Range   Urine Culture, Comprehensive Final report (A)    Organism ID, Bacteria Comment (A)     Comment: Pseudomonas aeruginosa 50,000-100,000 colony forming units per mL    Organism ID, Bacteria Comment (A)     Comment: Beta hemolytic Streptococcus, group B 50,000-100,000 colony forming units per  mL Penicillin and ampicillin are drugs of choice for treatment of beta-hemolytic streptococcal infections. Susceptibility testing of penicillins and other beta-lactam agents approved by the FDA for treatment of beta-hemolytic streptococcal infections need not be performed routinely because nonsusceptible isolates are extremely rare in any beta-hemolytic streptococcus and have not been reported for Streptococcus pyogenes (group A). (CLSI)    ANTIMICROBIAL SUSCEPTIBILITY Comment     Comment:       ** S = Susceptible; I = Intermediate; R = Resistant **                    P = Positive; N = Negative             MICS are expressed in micrograms per mL    Antibiotic                 RSLT#1    RSLT#2    RSLT#3    RSLT#4 Amikacin                       S Cefepime                       S Ceftazidime                    S Ciprofloxacin                  S Gentamicin                     S Imipenem                       S Levofloxacin                   S Meropenem                      S Piperacillin                   S Ticarcillin                    S Tobramycin                     S   Microscopic Examination     Status: Abnormal   Collection Time: 09/18/21  2:21 PM   Urine  Result Value Ref Range   WBC, UA >30 (A) 0 - 5 /hpf   RBC, Urine >30 (A) 0 - 2 /hpf   Epithelial Cells (non renal) 0-10 0 - 10 /hpf   Bacteria, UA Many (A) None seen/Few  BLADDER SCAN AMB NON-IMAGING     Status: None   Collection Time: 09/19/21 10:07 AM  Result Value Ref Range   Scan Result >830m   Blood Culture (routine x 2)     Status: None   Collection Time: 09/19/21  4:47 PM   Specimen: BLOOD  Result Value Ref Range   Specimen Description BLOOD BLOOD LEFT FOREARM    Special Requests      BOTTLES DRAWN AEROBIC AND ANAEROBIC Blood Culture adequate volume   Culture      NO GROWTH 5 DAYS Performed at [facility], [address], [place], [place] [address]    Report Status 09/24/2021 FINAL   Lactic  acid, plasma     Status: None   Collection Time: 09/19/21  4:50 PM  Result Value Ref Range   Lactic Acid, Venous 1.2 0.5 - 1.9 mmol/L    Comment: Performed at [facility], [address] [place], [place] [address]  Comprehensive metabolic panel     Status: Abnormal   Collection Time: 09/19/21  4:50 PM  Result Value Ref Range   Sodium 138 135 - 145 mmol/L   Potassium 3.3 (L) 3.5 - 5.1 mmol/L   Chloride 104 98 - 111 mmol/L   CO2 24 22 - 32 mmol/L   Glucose, Bld 197 (H) 70 - 99 mg/dL    Comment: Glucose reference range applies only to samples taken after fasting for at least 8 hours.   BUN 16 8 - 23 mg/dL   Creatinine, Ser 1.59 (H) 0.61 - 1.24 mg/dL   Calcium 9.3 8.9 - 10.3 mg/dL   Total Protein 6.6 6.5 - 8.1 g/dL   Albumin 2.9 (L) 3.5 - 5.0 g/dL   AST 16 15 - 41 U/L   ALT 13 0 - 44 U/L   Alkaline Phosphatase 56 38 - 126 U/L   Total Bilirubin 1.1 0.3 - 1.2 mg/dL   GFR, Estimated 43 (L) >60 mL/min    Comment: (NOTE) Calculated using the CKD-EPI Creatinine Equation (2021)    Anion gap 10 5 - 15    Comment: Performed at [facility], [address] [place], [place] [address]  CBC with Differential     Status: Abnormal   Collection Time: 09/19/21  4:50 PM  Result Value Ref Range   WBC 13.5 (H) 4.0 - 10.5 K/uL   RBC 4.03 (L) 4.22 - 5.81 MIL/uL   Hemoglobin 12.2 (L) 13.0 - 17.0 g/dL   HCT 36.0 (L) 39.0 - 52.0 %   MCV 89.3 80.0 - 100.0 fL   MCH 30.3 26.0 - 34.0 pg   MCHC 33.9 30.0 - 36.0 g/dL   RDW 12.0 11.5 - 15.5 %   Platelets 230 150 - 400 K/uL   nRBC 0.0 0.0 - 0.2 %   Neutrophils Relative % 84 %   Neutro Abs 11.4 (H) 1.7 - 7.7 K/uL   Lymphocytes Relative 7 %   Lymphs Abs 1.0 0.7 - 4.0 K/uL   Monocytes Relative 7 %   Monocytes Absolute 1.0 0.1 - 1.0 K/uL   Eosinophils Relative 1 %   Eosinophils Absolute 0.1 0.0 - 0.5 K/uL   Basophils Relative 0 %   Basophils Absolute 0.1 0.0 - 0.1 K/uL   Immature Granulocytes 1 %   Abs Immature Granulocytes 0.07  0.00 - 0.07 K/uL    Comment: Performed at [facility], [address] [place], [place] [address]  Protime-INR     Status: Abnormal   Collection Time: 09/19/21  4:50 PM  Result Value Ref Range   Prothrombin Time 15.6 (H) 11.4 - 15.2 seconds   INR 1.3 (H) 0.8 - 1.2    Comment: (NOTE) INR goal varies based on device and disease states. Performed at  [facility], [address] [place], [place] [address]   APTT     Status: None   Collection Time: 09/19/21  4:50 PM  Result Value Ref Range   aPTT 29 24 - 36 seconds    Comment: Performed at [facility], [address] [place], [place] [address]  CK     Status: None   Collection Time: 09/19/21  4:50 PM  Result Value Ref Range   Total CK 114 49 - 397 U/L    Comment: Performed at [facility], [place], [place] [address]  Troponin I (High Sensitivity)     Status: Abnormal   Collection Time: 09/19/21  4:50 PM  Result Value Ref Range   Troponin I (High Sensitivity) 42 (H) <18 ng/L    Comment: (NOTE) Elevated high sensitivity troponin I (hsTnI) values and significant  changes across serial measurements may suggest ACS but many other  chronic and acute conditions are known to elevate hsTnI results.  Refer to the "Links" section for chest pain algorithms and additional  guidance. Performed at [facility], [address] [place], [place] [address]   Magnesium     Status: Abnormal   Collection Time: 09/19/21  4:50 PM  Result Value Ref Range   Magnesium 1.4 (L) 1.7 - 2.4 mg/dL    Comment: Performed at [facility], [address] [place], [place] [address]  Procalcitonin - Baseline     Status: None   Collection Time: 09/19/21  4:50 PM  Result Value Ref Range   Procalcitonin <0.10 ng/mL    Comment:        Interpretation: PCT (Procalcitonin) <= 0.5 ng/mL: Systemic infection (sepsis) is not likely. Local bacterial infection is possible. (NOTE)       Sepsis  PCT Algorithm           Lower Respiratory Tract                                      Infection PCT Algorithm    ----------------------------     ----------------------------         PCT < 0.25 ng/mL                PCT < 0.10 ng/mL          Strongly encourage             Strongly discourage   discontinuation of antibiotics    initiation of antibiotics    ----------------------------     -----------------------------       PCT 0.25 - 0.50 ng/mL            PCT 0.10 - 0.25 ng/mL               OR       >80% decrease in PCT            Discourage initiation of                                            antibiotics      Encourage discontinuation           of antibiotics    ----------------------------     -----------------------------         PCT >= 0.50 ng/mL  PCT 0.26 - 0.50 ng/mL               AND        <80% decrease in PCT             Encourage initiation of                                             antibiotics       Encourage continuation           of antibiotics    ----------------------------     -----------------------------        PCT >= 0.50 ng/mL                  PCT > 0.50 ng/mL               AND         increase in PCT                  Strongly encourage                                      initiation of antibiotics    Strongly encourage escalation           of antibiotics                                     -----------------------------                                           PCT <= 0.25 ng/mL                                                 OR                                        > 80% decrease in PCT                                      Discontinue / Do not initiate                                             antibiotics  Performed at [facility], [address] [place], [place] [address]   Blood Culture (routine x 2)     Status: None   Collection Time: 09/19/21  4:52 PM   Specimen: BLOOD  Result Value Ref Range   Specimen Description  BLOOD BLOOD LEFT HAND    Special Requests      BOTTLES DRAWN AEROBIC AND ANAEROBIC Blood Culture results may not be optimal due to an inadequate volume of blood received  in culture bottles   Culture      NO GROWTH 5 DAYS Performed at [facility], [address] [place], [place] [address]    Report Status 09/24/2021 FINAL   Urinalysis, Complete w Microscopic     Status: Abnormal   Collection Time: 09/19/21  5:19 PM  Result Value Ref Range   Color, Urine YELLOW (A) YELLOW   APPearance HAZY (A) CLEAR   Specific Gravity, Urine 1.011 1.005 - 1.030   pH 6.0 5.0 - 8.0   Glucose, UA 50 (A) NEGATIVE mg/dL   Hgb urine dipstick MODERATE (A) NEGATIVE   Bilirubin Urine NEGATIVE NEGATIVE   Ketones, ur 5 (A) NEGATIVE mg/dL   Protein, ur >=300 (A) NEGATIVE mg/dL   Nitrite NEGATIVE NEGATIVE   Leukocytes,Ua MODERATE (A) NEGATIVE   RBC / HPF >50 (H) 0 - 5 RBC/hpf   WBC, UA >50 (H) 0 - 5 WBC/hpf   Bacteria, UA RARE (A) NONE SEEN   Squamous Epithelial / LPF NONE SEEN 0 - 5   Mucus PRESENT     Comment: Performed at [facility], [address], [place], [place] [address]  Urine Culture     Status: Abnormal   Collection Time: 09/19/21  5:19 PM   Specimen: In/Out Cath Urine  Result Value Ref Range   Specimen Description      IN/OUT CATH URINE Performed at [facility], [address] [place], [place] [address]    Special Requests      NONE Performed at [facility], [address], [place], [place] [address]    Culture (A)     >=100,000 COLONIES/mL PSEUDOMONAS AERUGINOSA 70,000 COLONIES/mL ENTEROCOCCUS FAECALIS    Report Status 09/22/2021 FINAL    Organism ID, Bacteria PSEUDOMONAS AERUGINOSA (A)    Organism ID, Bacteria ENTEROCOCCUS FAECALIS (A)       Susceptibility   Enterococcus faecalis - MIC*    AMPICILLIN <=2 SENSITIVE Sensitive     NITROFURANTOIN <=16 SENSITIVE Sensitive     VANCOMYCIN 1 SENSITIVE Sensitive     * 70,000 COLONIES/mL  ENTEROCOCCUS FAECALIS   Pseudomonas aeruginosa - MIC*    CEFTAZIDIME 4 SENSITIVE Sensitive     CIPROFLOXACIN <=0.25 SENSITIVE Sensitive     GENTAMICIN <=1 SENSITIVE Sensitive     IMIPENEM 2 SENSITIVE Sensitive     PIP/TAZO 8 SENSITIVE Sensitive     * >=100,000 COLONIES/mL PSEUDOMONAS AERUGINOSA  Lactic acid, plasma     Status: None   Collection Time: 09/19/21  7:40 PM  Result Value Ref Range   Lactic Acid, Venous 0.9 0.5 - 1.9 mmol/L    Comment: Performed at [facility], [place], [place] [address]  Troponin I (High Sensitivity)     Status: Abnormal   Collection Time: 09/19/21  7:40 PM  Result Value Ref Range   Troponin I (High Sensitivity) 47 (H) <18 ng/L    Comment: (NOTE) Elevated high sensitivity troponin I (hsTnI) values and significant  changes across serial measurements may suggest ACS but many other  chronic and acute conditions are known to elevate hsTnI results.  Refer to the "Links" section for chest pain algorithms and additional  guidance. Performed at [facility], [address] [place], [place] [address]   CBG monitoring, ED     Status: Abnormal   Collection Time: 09/19/21 10:27 PM  Result Value Ref Range   Glucose-Capillary 132 (H) 70 - 99 mg/dL    Comment: Glucose reference range applies  only to samples taken after fasting for at least 8 hours.  Procalcitonin     Status: None   Collection Time: 09/20/21  3:49 AM  Result Value Ref Range   Procalcitonin 0.28 ng/mL    Comment:        Interpretation: PCT (Procalcitonin) <= 0.5 ng/mL: Systemic infection (sepsis) is not likely. Local bacterial infection is possible. (NOTE)       Sepsis PCT Algorithm           Lower Respiratory Tract                                      Infection PCT Algorithm    ----------------------------     ----------------------------         PCT < 0.25 ng/mL                PCT < 0.10 ng/mL          Strongly encourage             Strongly discourage    discontinuation of antibiotics    initiation of antibiotics    ----------------------------     -----------------------------       PCT 0.25 - 0.50 ng/mL            PCT 0.10 - 0.25 ng/mL               OR       >80% decrease in PCT            Discourage initiation of                                            antibiotics      Encourage discontinuation           of antibiotics    ----------------------------     -----------------------------         PCT >= 0.50 ng/mL              PCT 0.26 - 0.50 ng/mL               AND        <80% decrease in PCT             Encourage initiation of                                             antibiotics       Encourage continuation           of antibiotics    ----------------------------     -----------------------------        PCT >= 0.50 ng/mL                  PCT > 0.50 ng/mL               AND         increase in PCT                  Strongly encourage  initiation of antibiotics    Strongly encourage escalation           of antibiotics                                     -----------------------------                                           PCT <= 0.25 ng/mL                                                 OR                                        > 80% decrease in PCT                                      Discontinue / Do not initiate                                             antibiotics  Performed at [facility], [address] [place], [place] [address]   Heparin level (unfractionated)     Status: None   Collection Time: 09/20/21  3:49 AM  Result Value Ref Range   Heparin Unfractionated 0.53 0.30 - 0.70 IU/mL    Comment: (NOTE) The clinical reportable range upper limit is being lowered to >1.10 to align with the FDA approved guidance for the current laboratory assay.  If heparin results are below expected values, and patient dosage has  been confirmed, suggest follow up testing of  antithrombin III levels. Performed at [facility], [address] [place], [place] [address]   CBC     Status: Abnormal   Collection Time: 09/20/21  3:49 AM  Result Value Ref Range   WBC 13.6 (H) 4.0 - 10.5 K/uL   RBC 4.59 4.22 - 5.81 MIL/uL   Hemoglobin 13.8 13.0 - 17.0 g/dL   HCT 41.2 39.0 - 52.0 %   MCV 89.8 80.0 - 100.0 fL   MCH 30.1 26.0 - 34.0 pg   MCHC 33.5 30.0 - 36.0 g/dL   RDW 12.1 11.5 - 15.5 %   Platelets 245 150 - 400 K/uL   nRBC 0.0 0.0 - 0.2 %    Comment: Performed at [facility], [address] [place], [place] [address]  CBG monitoring, ED     Status: Abnormal   Collection Time: 09/20/21  7:52 AM  Result Value Ref Range   Glucose-Capillary 130 (H) 70 - 99 mg/dL    Comment: Glucose reference range applies only to samples taken after fasting for at least 8 hours.  ECHOCARDIOGRAM COMPLETE     Status: None   Collection Time: 09/20/21 12:50 PM  Result Value Ref Range   Weight 4,256 oz   Height 70 in   BP 138/68 mmHg  Ao pk vel 2.36 m/s   AV Area VTI 1.56 cm2   AR max vel 1.48 cm2   AV Mean grad 13.0 mmHg   AV Peak grad 22.3 mmHg   S' Lateral 2.48 cm   AV Area mean vel 1.47 cm2   Area-P 1/2 2.63 cm2  CBG monitoring, ED     Status: Abnormal   Collection Time: 09/20/21  1:08 PM  Result Value Ref Range   Glucose-Capillary 180 (H) 70 - 99 mg/dL    Comment: Glucose reference range applies only to samples taken after fasting for at least 8 hours.  Heparin level (unfractionated)     Status: Abnormal   Collection Time: 09/20/21  1:50 PM  Result Value Ref Range   Heparin Unfractionated 0.20 (L) 0.30 - 0.70 IU/mL    Comment: (NOTE) The clinical reportable range upper limit is being lowered to >1.10 to align with the FDA approved guidance for the current laboratory assay.  If heparin results are below expected values, and patient dosage has  been confirmed, suggest follow up testing of antithrombin III levels. Performed at [facility], [address] [place], [place] [address]   Glucose, capillary     Status: Abnormal   Collection Time: 09/20/21  5:22 PM  Result Value Ref Range   Glucose-Capillary 147 (H) 70 - 99 mg/dL    Comment: Glucose reference range applies only to samples taken after fasting for at least 8 hours.   Comment 1 Notify RN    Comment 2 Document in Chart   Glucose, capillary     Status: Abnormal   Collection Time: 09/20/21  9:22 PM  Result Value Ref Range   Glucose-Capillary 190 (H) 70 - 99 mg/dL    Comment: Glucose reference range applies only to samples taken after fasting for at least 8 hours.  Procalcitonin     Status: None   Collection Time: 09/21/21  7:05 AM  Result Value Ref Range   Procalcitonin 0.30 ng/mL    Comment:        Interpretation: PCT (Procalcitonin) <= 0.5 ng/mL: Systemic infection (sepsis) is not likely. Local bacterial infection is possible. (NOTE)       Sepsis PCT Algorithm           Lower Respiratory Tract                                      Infection PCT Algorithm    ----------------------------     ----------------------------         PCT < 0.25 ng/mL                PCT < 0.10 ng/mL          Strongly encourage             Strongly discourage   discontinuation of antibiotics    initiation of antibiotics    ----------------------------     -----------------------------       PCT 0.25 - 0.50 ng/mL            PCT 0.10 - 0.25 ng/mL               OR       >80% decrease in PCT            Discourage initiation of  antibiotics      Encourage discontinuation           of antibiotics    ----------------------------     -----------------------------         PCT >= 0.50 ng/mL              PCT 0.26 - 0.50 ng/mL               AND        <80% decrease in PCT             Encourage initiation of                                             antibiotics       Encourage continuation           of antibiotics     ----------------------------     -----------------------------        PCT >= 0.50 ng/mL                  PCT > 0.50 ng/mL               AND         increase in PCT                  Strongly encourage                                      initiation of antibiotics    Strongly encourage escalation           of antibiotics                                     -----------------------------                                           PCT <= 0.25 ng/mL                                                 OR                                        > 80% decrease in PCT                                      Discontinue / Do not initiate                                             antibiotics  Performed at [facility], [address], [place], [place] [address]   CBC     Status: Abnormal   Collection Time: 09/21/21  7:05 AM  Result Value Ref Range   WBC 9.9 4.0 - 10.5 K/uL   RBC 3.77 (L) 4.22 - 5.81 MIL/uL   Hemoglobin 11.3 (L) 13.0 - 17.0 g/dL   HCT 33.5 (L) 39.0 - 52.0 %   MCV 88.9 80.0 - 100.0 fL   MCH 30.0 26.0 - 34.0 pg   MCHC 33.7 30.0 - 36.0 g/dL   RDW 12.3 11.5 - 15.5 %   Platelets 265 150 - 400 K/uL   nRBC 0.0 0.0 - 0.2 %    Comment: Performed at [facility], [address] [place], [place] [address]  Basic metabolic panel     Status: Abnormal   Collection Time: 09/21/21  7:05 AM  Result Value Ref Range   Sodium 136 135 - 145 mmol/L   Potassium 2.8 (L) 3.5 - 5.1 mmol/L   Chloride 98 98 - 111 mmol/L   CO2 30 22 - 32 mmol/L   Glucose, Bld 124 (H) 70 - 99 mg/dL    Comment: Glucose reference range applies only to samples taken after fasting for at least 8 hours.   BUN 15 8 - 23 mg/dL   Creatinine, Ser 1.54 (H) 0.61 - 1.24 mg/dL   Calcium 9.2 8.9 - 10.3 mg/dL   GFR, Estimated 45 (L) >60 mL/min    Comment: (NOTE) Calculated using the CKD-EPI Creatinine Equation (2021)    Anion gap 8 5 - 15    Comment: Performed at [facility], [address]  [place], [place] [address]  Glucose, capillary     Status: Abnormal   Collection Time: 09/21/21  7:55 AM  Result Value Ref Range   Glucose-Capillary 126 (H) 70 - 99 mg/dL    Comment: Glucose reference range applies only to samples taken after fasting for at least 8 hours.  Glucose, capillary     Status: Abnormal   Collection Time: 09/21/21 12:20 PM  Result Value Ref Range   Glucose-Capillary 171 (H) 70 - 99 mg/dL    Comment: Glucose reference range applies only to samples taken after fasting for at least 8 hours.  Lipase, blood     Status: None   Collection Time: 09/30/21  2:02 PM  Result Value Ref Range   Lipase 21 11 - 51 U/L    Comment: Performed at [facility], [address] [place], [place] [address]  Comprehensive metabolic panel     Status: Abnormal   Collection Time: 09/30/21  2:02 PM  Result Value Ref Range   Sodium 135 135 - 145 mmol/L   Potassium 4.7 3.5 - 5.1 mmol/L   Chloride 102 98 - 111 mmol/L   CO2 26 22 - 32 mmol/L   Glucose, Bld 230 (H) 70 - 99 mg/dL    Comment: Glucose reference range applies only to samples taken after fasting for at least 8 hours.   BUN 15 8 - 23 mg/dL   Creatinine, Ser 1.51 (H) 0.61 - 1.24 mg/dL   Calcium 10.3 8.9 - 10.3 mg/dL   Total Protein 7.9 6.5 - 8.1 g/dL   Albumin 3.3 (L) 3.5 - 5.0 g/dL   AST 14 (L) 15 - 41 U/L   ALT 13 0 - 44 U/L   Alkaline Phosphatase 71 38 - 126 U/L   Total Bilirubin 0.8 0.3 - 1.2 mg/dL   GFR, Estimated 46 (L) >60 mL/min    Comment: (NOTE) Calculated using the CKD-EPI Creatinine Equation (2021)    Anion gap 7 5 - 15  Comment: Performed at [facility], [address] [place], [place] [address]  CBC     Status: None   Collection Time: 09/30/21  2:02 PM  Result Value Ref Range   WBC 9.5 4.0 - 10.5 K/uL   RBC 4.89 4.22 - 5.81 MIL/uL   Hemoglobin 14.3 13.0 - 17.0 g/dL   HCT 43.6 39.0 - 52.0 %   MCV 89.2 80.0 - 100.0 fL   MCH 29.2 26.0 - 34.0 pg   MCHC 32.8 30.0 - 36.0 g/dL   RDW 12.6  11.5 - 15.5 %   Platelets 338 150 - 400 K/uL   nRBC 0.0 0.0 - 0.2 %    Comment: Performed at [facility], [address] [place], [place] [address]  Urinalysis, Routine w reflex microscopic Urine, Clean Catch     Status: Abnormal   Collection Time: 09/30/21  3:45 PM  Result Value Ref Range   Color, Urine YELLOW (A) YELLOW   APPearance HAZY (A) CLEAR   Specific Gravity, Urine 1.009 1.005 - 1.030   pH 6.0 5.0 - 8.0   Glucose, UA 50 (A) NEGATIVE mg/dL   Hgb urine dipstick MODERATE (A) NEGATIVE   Bilirubin Urine NEGATIVE NEGATIVE   Ketones, ur NEGATIVE NEGATIVE mg/dL   Protein, ur >=300 (A) NEGATIVE mg/dL   Nitrite POSITIVE (A) NEGATIVE   Leukocytes,Ua SMALL (A) NEGATIVE   RBC / HPF 0-5 0 - 5 RBC/hpf   WBC, UA 11-20 0 - 5 WBC/hpf   Bacteria, UA FEW (A) NONE SEEN   Squamous Epithelial / LPF NONE SEEN 0 - 5   Mucus PRESENT    Amorphous Crystal PRESENT     Comment: Performed at [facility], [address] [place], [place] [address]  Urine Culture     Status: Abnormal   Collection Time: 09/30/21  3:45 PM   Specimen: Urine, Catheterized  Result Value Ref Range   Specimen Description      URINE, CATHETERIZED Performed at [facility], [address] [place], [place] [address]    Special Requests      NONE Performed at [facility], [address] [place], [place] [address]    Culture (A)     >=100,000 COLONIES/mL ENTEROCOCCUS FAECALIS 20,000 COLONIES/mL PSEUDOMONAS AERUGINOSA    Report Status 10/02/2021 FINAL    Organism ID, Bacteria PSEUDOMONAS AERUGINOSA (A)    Organism ID, Bacteria ENTEROCOCCUS FAECALIS (A)       Susceptibility   Enterococcus faecalis - MIC*    AMPICILLIN <=2 SENSITIVE Sensitive     NITROFURANTOIN <=16 SENSITIVE Sensitive     VANCOMYCIN 1 SENSITIVE Sensitive     * >=100,000 COLONIES/mL ENTEROCOCCUS FAECALIS   Pseudomonas aeruginosa - MIC*    CEFTAZIDIME 8 SENSITIVE Sensitive     CIPROFLOXACIN 1 INTERMEDIATE  Intermediate     GENTAMICIN <=1 SENSITIVE Sensitive     IMIPENEM 2 SENSITIVE Sensitive     * 20,000 COLONIES/mL PSEUDOMONAS AERUGINOSA     PHQ2/9:    09/01/2021   11:39 AM 07/08/2021    1:28 PM 10/16/2020   10:47 AM 09/02/2020    8:57 AM 08/30/2020    1:05 PM  Depression screen PHQ 2/9  Decreased Interest 1 0 0 0 0  Down, Depressed, Hopeless 1 0 0 0 0  PHQ - 2 Score 2 0 0 0 0  Altered sleeping 0 0     Tired, decreased energy 0 0     Change in appetite 0 0  Feeling bad or failure about yourself  0 0     Trouble concentrating 0 0     Moving slowly or fidgety/restless 0 0     Suicidal thoughts  0     PHQ-9 Score 2 0     Difficult doing work/chores Not difficult at all          Fall Risk:    09/01/2021   11:36 AM 07/08/2021    1:27 PM 10/16/2020   10:46 AM 09/02/2020    8:56 AM 08/30/2020    1:05 PM  Fall Risk   Falls in the past year? 1 1 0 1 1  Comment     loses balance  Number falls in past yr: 1 1 0 1 1  Injury with Fall? 1 0 0 0 0  Risk for fall due to :  No Fall Risks No Fall Risks Impaired balance/gait Impaired balance/gait;Impaired mobility;Medication side effect  Follow up Falls evaluation completed;Education provided;Falls prevention discussed Falls evaluation completed Falls evaluation completed Falls evaluation completed;Education provided;Falls prevention discussed Falls evaluation completed;Education provided;Falls prevention discussed      Functional Status Survey:      Assessment & Plan  Problem List Items Addressed This Visit       Genitourinary   Urinary tract infection without hematuria - Primary    Acute, appears to have been discovered when patient was in ED on 09/30/2021 but I cannot find evidence of him being on abx for treatment Will use culture results to dictate abx therapy- will start with macrobid as this was indicated by culture Recommend he return to Urology in one week to assess for cure as he is having to use catheter for urinary  assistance Will coordinate with Urology for further assistance       Relevant Medications   nitrofurantoin, macrocrystal-monohydrate, (MACROBID) 100 MG capsule     Other   Constipation    Acute, appears recurrent Reports he has not had a bowel movement in 2 weeks  Discussed using miralax and enema/suppositories to assist with bowel movement but I suspect he may have impaction If not responding to continued Miralax and enema/suppository I recommend he go to ED for manual disimpaction  Recommend continued use of Miralax until having regular bowel movements- gradual taper while increasing fiber intake.  Follow up in 2 weeks.       Hx of pulmonary embolus    Patient was admitted to hospital on June 30th 2023 for PE  Chart review does not demonstrate follow up with PCP or Pulm/ Cardiology  Will send refill of Eliquis to continue anticoagulant therapy  He has apt with Cardiology tomorrow- Discussed apt along with HTN medications and I recommend he take his meds with bottles to apt to discern refill status and regimen from here.  Follow up in 2 weeks for monitoring       Relevant Medications   apixaban (ELIQUIS) 5 MG TABS tablet (Start on 10/14/2021)     Return in about 2 weeks (around 10/20/2021) for UTI, PE and constipation follow up .   I, Niam Nepomuceno E Erianna Jolly, PA-C, have reviewed all documentation for this visit. The documentation on 10/06/21 for the exam, diagnosis, procedures, and orders are all accurate and complete.   Talitha Givens, MHS, PA-C [facility]

## 2021-10-06 NOTE — Patient Instructions (Signed)
Stop Flomax (tamsulosin). Start Myrbetriq (mirabegron), the samples I gave you, once daily to help with bladder spasms.  I'll arrange for you to see Interventional Radiology to discuss a prostatic artery embolization.  If you develop lower abdominal pain, low back pain, fever, chills, nausea, or vomiting, go to the Emergency Department.

## 2021-10-06 NOTE — Progress Notes (Signed)
10/06/2021 5:11 PM   [name] 1940-07-12 [id number]  CC: Chief Complaint  Patient presents with   Urinary Retention    HPI: Justin Griffith is a 81 y.o. extremely comorbid male with recurrent UTIs, gross hematuria, and urinary retention managed with indwelling Foley catheter previously planning for HOLEP but he subsequently developed PE now on Eliquis who presents today for evaluation of catheter difficulties.   Today he reports he has had leaking from his drainage bag.  He reports intermittent sensations of urinary urgency associated with increased urinary leakage.  He denies low belly pain, low back pain, fever, chills, nausea, or vomiting.  He was recently treated with Cipro for UTI, most recent urine culture dated 09/30/2021 grew pansensitive E faecalis and low colony counts of Cipro intermediate Pseudomonas aeruginosa.  PMH: Past Medical History:  Diagnosis Date   Acute urinary retention    Arthritis    Asthma 2000   Back pain    Colon polyp 2012   COPD (chronic obstructive pulmonary disease) (HCC)    Emphysema of lung (HCC)    Heart murmur    Hernia 2000   Hyperlipidemia    Hypertension    Personal history of malignant neoplasm of large intestine    Personal history of tobacco use, presenting hazards to health    Sleep apnea    Special screening for malignant neoplasms, colon    Type 2 diabetes mellitus with proteinuria ([place]) 10/11/2014    Surgical History: Past Surgical History:  Procedure Laterality Date   CARDIAC CATHETERIZATION Left 09/25/2015   Procedure: Left Heart Cath and Coronary Angiography;  Surgeon: Yolonda Kida, MD;  Location: [place] [facility];  Service: Cardiovascular;  Laterality: Left;   CHOLECYSTECTOMY  1970   COLON SURGERY  07-16-99   sigmoid colon resection with primary anastomosis, chemotherapy for metastatic disease   COLONOSCOPY  2001, 2012   Dr Bary Castilla, tubular adenoma of the cecum and ascending colon in 2012.   COLONOSCOPY  WITH PROPOFOL N/A 02/12/2016   Procedure: COLONOSCOPY WITH PROPOFOL;  Surgeon: Robert Bellow, MD;  Location: [facility] END[facility];  Service: Endoscopy;  Laterality: N/A;   CORONARY STENT INTERVENTION N/A 05/17/2020   Procedure: CORONARY STENT INTERVENTION;  Surgeon: Yolonda Kida, MD;  Location: [place] [facility];  Service: Cardiovascular;  Laterality: N/A;  LAD   ESOPHAGOGASTRODUODENOSCOPY (EGD) WITH PROPOFOL N/A 02/12/2016   Procedure: ESOPHAGOGASTRODUODENOSCOPY (EGD) WITH PROPOFOL;  Surgeon: Robert Bellow, MD;  Location: [facility] ENDOSCOPY;  Service: Endoscopy;  Laterality: N/A;   HERNIA REPAIR Right    right inguinal hernia repair   JOINT REPLACEMENT Bilateral    knee replacement   KNEE SURGERY Bilateral 2010   LEFT HEART CATH AND CORONARY ANGIOGRAPHY N/A 05/17/2020   Procedure: LEFT HEART CATH AND CORONARY ANGIOGRAPHY possible PCI and stent;  Surgeon: Yolonda Kida, MD;  Location: [place] [facility];  Service: Cardiovascular;  Laterality: N/A;   MYRINGOTOMY WITH TUBE PLACEMENT Bilateral 08/16/2014   Procedure: MYRINGOTOMY WITH TUBE PLACEMENT;  Surgeon: Carloyn Manner, MD;  Location: [facility] ORS;  Service: ENT;  Laterality: Bilateral;   PERIPHERAL VASCULAR CATHETERIZATION Right 09/04/2015   Procedure: Lower Extremity Angiography;  Surgeon: Katha Cabal, MD;  Location: [place] [facility];  Service: Cardiovascular;  Laterality: Right;   PERIPHERAL VASCULAR CATHETERIZATION  09/04/2015   Procedure: Lower Extremity Intervention;  Surgeon: Katha Cabal, MD;  Location: [place] [facility];  Service: Cardiovascular;;   TONSILLECTOMY     TYMPANOSTOMY TUBE PLACEMENT  Home Medications:  Allergies as of 10/06/2021       Reactions   Farxiga [dapagliflozin] Rash   Causes a severe rash in groin area   Dulaglutide Diarrhea, Other (See Comments)   Liraglutide Diarrhea, Other (See Comments)   Jardiance [empagliflozin] Rash        Medication List        Accurate as of  October 06, 2021  5:11 PM. If you have any questions, ask your nurse or doctor.          STOP taking these medications    acyclovir 400 MG tablet Commonly known as: ZOVIRAX Stopped by: Dani Gobble Mecum, PA-C   tamsulosin 0.4 MG Caps capsule Commonly known as: FLOMAX Stopped by: Debroah Loop, PA-C       TAKE these medications    Apixaban Starter Pack ('10mg'$  and '5mg'$ ) Commonly known as: ELIQUIS STARTER PACK Take as directed on package: start with two-'5mg'$  tablets twice daily for 7 days. On day 8, switch to one-'5mg'$  tablet twice daily. What changed: Another medication with the same name was added. Make sure you understand how and when to take each. Changed by: Dani Gobble Mecum, PA-C   apixaban 5 MG Tabs tablet Commonly known as: Eliquis Take 1 tablet (5 mg total) by mouth 2 (two) times daily. Start taking on: October 14, 2021 What changed: You were already taking a medication with the same name, and this prescription was added. Make sure you understand how and when to take each. Changed by: Dani Gobble Mecum, PA-C   aspirin EC 81 MG tablet Take 1 tablet (81 mg total) by mouth daily. Swallow whole.   Breztri Aerosphere 160-9-4.8 MCG/ACT Aero Generic drug: Budeson-Glycopyrrol-Formoterol Inhale 1 puff into the lungs daily.   carvedilol 25 MG tablet Commonly known as: COREG TAKE ONE TABLET BY MOUTH TWO TIMES A DAY FOR BLOOD PRESSURE   cloNIDine 0.1 MG tablet Commonly known as: CATAPRES Take 0.1 mg by mouth 2 (two) times daily.   fenofibrate 145 MG tablet Commonly known as: Tricor Take 1 tablet (145 mg total) by mouth daily.   hydrALAZINE 50 MG tablet Commonly known as: APRESOLINE TAKE ONE TABLET BY MOUTH THREE TIMES A DAY FOR BLOOD PRESSURE   insulin glargine-yfgn 100 UNIT/ML Pen Commonly known as: SEMGLEE INJECT 10 UNITS UNDER SKIN ONCE EVERY DAY FOR DIABETES DISCARD PEN 28 DAYS AFTER OPENING. * GIVE AT THE SAME TIME DAILY *   losartan 100 MG tablet Commonly known as:  COZAAR Take 100 mg by mouth daily.   magnesium oxide 400 (240 Mg) MG tablet Commonly known as: MAG-OX Take 1 tablet (400 mg total) by mouth daily.   metFORMIN 1000 MG tablet Commonly known as: GLUCOPHAGE Take 1 tablet (1,000 mg total) by mouth 2 (two) times daily with a meal.   mirabegron ER 50 MG Tb24 tablet Commonly known as: MYRBETRIQ Take 1 tablet (50 mg total) by mouth daily. Started by: Debroah Loop, PA-C   nitrofurantoin (macrocrystal-monohydrate) 100 MG capsule Commonly known as: MACROBID Take 1 capsule (100 mg total) by mouth 2 (two) times daily. Started by: Dani Gobble Mecum, PA-C   nortriptyline 10 MG capsule Commonly known as: PAMELOR Take 10 mg by mouth 2 (two) times daily.   rosuvastatin 40 MG tablet Commonly known as: CRESTOR Take 1 tablet (40 mg total) by mouth at bedtime.   Spiriva Respimat 2.5 MCG/ACT Aers Generic drug: Tiotropium Bromide Monohydrate Inhale 2 puffs into the lungs daily.   Torsemide 40 MG Tabs Take  80 mg by mouth 2 (two) times daily.   vitamin B-12 500 MCG tablet Commonly known as: CYANOCOBALAMIN Take 2,000 mcg by mouth at bedtime.   Vitamin D (Cholecalciferol) 25 MCG (1000 UT) Caps Take 2,000 mcg by mouth at bedtime.        Allergies:  Allergies  Allergen Reactions   Farxiga [Dapagliflozin] Rash    Causes a severe rash in groin area    Dulaglutide Diarrhea and Other (See Comments)   Liraglutide Diarrhea and Other (See Comments)   Jardiance [Empagliflozin] Rash    Family History: Family History  Problem Relation Age of Onset   Diabetes Father    Esophageal cancer Mother    Alzheimer's disease Paternal Uncle     Social History:   reports that he quit smoking about 32 years ago. His smoking use included cigarettes. He has a 30.00 pack-year smoking history. He has been exposed to tobacco smoke. He has never used smokeless tobacco. He reports that he does not drink alcohol and does not use drugs.  Physical  Exam: There were no vitals taken for this visit.  Constitutional:  Alert and oriented, no acute distress, nontoxic appearing HEENT: [place], AT Cardiovascular: No clubbing, cyanosis, or edema Respiratory: Normal respiratory effort, no increased work of breathing GU: Foley catheter in place draining yellow urine.  The drainage valve on the leg bag is torn. Skin: No rashes, bruises or suspicious lesions Neurologic: Grossly intact, no focal deficits, moving all 4 extremities Psychiatric: Normal mood and affect  Assessment & Plan:   1. Bladder spasm He is not clinically infected today.  We will start Myrbetriq for management of bladder spasms.  We discussed that his most recent urine culture would be challenging to treat with oral antibiotics, so if he develops infective symptoms he should proceed to the emergency department for IV therapy. - mirabegron ER (MYRBETRIQ) 50 MG TB24 tablet; Take 1 tablet (50 mg total) by mouth daily.  Dispense: 28 tablet; Refill: 0  2. BPH with obstruction/lower urinary tract symptoms We discussed that we are unable to proceed with planned HOLEP within 6 months of his PE diagnosis while he is on anticoagulation.  His options include waiting for HOLEP for 6 months versus referral to IR for consideration of PAE.  He prefers the latter, we will reach out to my colleagues to arrange this.  Return for Will call to arrange PAE with IR.  Debroah Loop, PA-C  [facility] [address], [place], [place] [address] [phone]

## 2021-10-06 NOTE — Patient Instructions (Addendum)
I would recommend trying an enema or suppository per your choice to help with the constipation This is over the counter If this does not provide relief, I recommend going to the ED for potential disimpaction    Please take your medications with you to your Cardiology apt so they can figure out which ones need refills.    I am sending in a script for an antibiotic for your UTI that was found on 09/30/2021 Please finish the entire course Please follow up with Urology in one week to have a urinalysis so we can make sure this infection has been treated sufficiently.  Make sure you take this with food to help prevent GI upset

## 2021-10-06 NOTE — Assessment & Plan Note (Signed)
Acute, appears to have been discovered when patient was in ED on 09/30/2021 but I cannot find evidence of him being on abx for treatment Will use culture results to dictate abx therapy- will start with macrobid as this was indicated by culture Recommend he return to Urology in one week to assess for cure as he is having to use catheter for urinary assistance Will coordinate with Urology for further assistance

## 2021-10-06 NOTE — Telephone Encounter (Signed)
Patient coming in for appointment today. Will call if patient does not come for his appointment.

## 2021-10-06 NOTE — Assessment & Plan Note (Signed)
Acute, appears recurrent Reports he has not had a bowel movement in 2 weeks  Discussed using miralax and enema/suppositories to assist with bowel movement but I suspect he may have impaction If not responding to continued Miralax and enema/suppository I recommend he go to ED for manual disimpaction  Recommend continued use of Miralax until having regular bowel movements- gradual taper while increasing fiber intake.  Follow up in 2 weeks.

## 2021-10-07 DIAGNOSIS — I2510 Atherosclerotic heart disease of native coronary artery without angina pectoris: Secondary | ICD-10-CM | POA: Diagnosis not present

## 2021-10-07 DIAGNOSIS — G4733 Obstructive sleep apnea (adult) (pediatric): Secondary | ICD-10-CM | POA: Diagnosis not present

## 2021-10-07 DIAGNOSIS — I872 Venous insufficiency (chronic) (peripheral): Secondary | ICD-10-CM | POA: Diagnosis not present

## 2021-10-07 DIAGNOSIS — I824Y1 Acute embolism and thrombosis of unspecified deep veins of right proximal lower extremity: Secondary | ICD-10-CM | POA: Diagnosis not present

## 2021-10-07 DIAGNOSIS — I214 Non-ST elevation (NSTEMI) myocardial infarction: Secondary | ICD-10-CM | POA: Diagnosis not present

## 2021-10-07 DIAGNOSIS — E1169 Type 2 diabetes mellitus with other specified complication: Secondary | ICD-10-CM | POA: Diagnosis not present

## 2021-10-07 DIAGNOSIS — R600 Localized edema: Secondary | ICD-10-CM | POA: Diagnosis not present

## 2021-10-07 DIAGNOSIS — M79604 Pain in right leg: Secondary | ICD-10-CM | POA: Diagnosis not present

## 2021-10-07 DIAGNOSIS — J449 Chronic obstructive pulmonary disease, unspecified: Secondary | ICD-10-CM | POA: Diagnosis not present

## 2021-10-07 DIAGNOSIS — Z955 Presence of coronary angioplasty implant and graft: Secondary | ICD-10-CM | POA: Diagnosis not present

## 2021-10-07 DIAGNOSIS — E1159 Type 2 diabetes mellitus with other circulatory complications: Secondary | ICD-10-CM | POA: Diagnosis not present

## 2021-10-07 NOTE — Addendum Note (Signed)
Addended by: Mickle Plumb on: 10/07/2021 11:36 AM   Modules accepted: Orders

## 2021-10-08 ENCOUNTER — Other Ambulatory Visit: Payer: Self-pay | Admitting: Physician Assistant

## 2021-10-08 DIAGNOSIS — R338 Other retention of urine: Secondary | ICD-10-CM

## 2021-10-09 ENCOUNTER — Other Ambulatory Visit: Payer: Self-pay

## 2021-10-09 ENCOUNTER — Encounter: Payer: Self-pay | Admitting: Family

## 2021-10-09 ENCOUNTER — Emergency Department
Admission: EM | Admit: 2021-10-09 | Discharge: 2021-10-09 | Disposition: A | Discharge status: Home / Self Care | Payer: HMO | Source: Home / Self Care | Attending: Emergency Medicine | Admitting: Emergency Medicine

## 2021-10-09 ENCOUNTER — Telehealth: Payer: Self-pay

## 2021-10-09 DIAGNOSIS — D6832 Hemorrhagic disorder due to extrinsic circulating anticoagulants: Secondary | ICD-10-CM | POA: Diagnosis present

## 2021-10-09 DIAGNOSIS — Z91148 Patient's other noncompliance with medication regimen for other reason: Secondary | ICD-10-CM | POA: Diagnosis not present

## 2021-10-09 DIAGNOSIS — I10 Essential (primary) hypertension: Secondary | ICD-10-CM | POA: Insufficient documentation

## 2021-10-09 DIAGNOSIS — N390 Urinary tract infection, site not specified: Secondary | ICD-10-CM

## 2021-10-09 DIAGNOSIS — N3001 Acute cystitis with hematuria: Secondary | ICD-10-CM

## 2021-10-09 DIAGNOSIS — R310 Gross hematuria: Secondary | ICD-10-CM | POA: Diagnosis present

## 2021-10-09 DIAGNOSIS — I2510 Atherosclerotic heart disease of native coronary artery without angina pectoris: Secondary | ICD-10-CM | POA: Diagnosis present

## 2021-10-09 DIAGNOSIS — R319 Hematuria, unspecified: Secondary | ICD-10-CM | POA: Diagnosis not present

## 2021-10-09 DIAGNOSIS — J439 Emphysema, unspecified: Secondary | ICD-10-CM | POA: Diagnosis present

## 2021-10-09 DIAGNOSIS — Z955 Presence of coronary angioplasty implant and graft: Secondary | ICD-10-CM | POA: Diagnosis not present

## 2021-10-09 DIAGNOSIS — M1990 Unspecified osteoarthritis, unspecified site: Secondary | ICD-10-CM | POA: Diagnosis present

## 2021-10-09 DIAGNOSIS — E1129 Type 2 diabetes mellitus with other diabetic kidney complication: Secondary | ICD-10-CM

## 2021-10-09 DIAGNOSIS — Z9049 Acquired absence of other specified parts of digestive tract: Secondary | ICD-10-CM | POA: Diagnosis not present

## 2021-10-09 DIAGNOSIS — G4730 Sleep apnea, unspecified: Secondary | ICD-10-CM | POA: Diagnosis present

## 2021-10-09 DIAGNOSIS — E1122 Type 2 diabetes mellitus with diabetic chronic kidney disease: Secondary | ICD-10-CM | POA: Diagnosis present

## 2021-10-09 DIAGNOSIS — I129 Hypertensive chronic kidney disease with stage 1 through stage 4 chronic kidney disease, or unspecified chronic kidney disease: Secondary | ICD-10-CM | POA: Diagnosis present

## 2021-10-09 DIAGNOSIS — Z87440 Personal history of urinary (tract) infections: Secondary | ICD-10-CM | POA: Diagnosis not present

## 2021-10-09 DIAGNOSIS — E785 Hyperlipidemia, unspecified: Secondary | ICD-10-CM | POA: Diagnosis present

## 2021-10-09 DIAGNOSIS — E1151 Type 2 diabetes mellitus with diabetic peripheral angiopathy without gangrene: Secondary | ICD-10-CM | POA: Diagnosis present

## 2021-10-09 DIAGNOSIS — B952 Enterococcus as the cause of diseases classified elsewhere: Secondary | ICD-10-CM | POA: Diagnosis present

## 2021-10-09 DIAGNOSIS — Z9221 Personal history of antineoplastic chemotherapy: Secondary | ICD-10-CM | POA: Diagnosis not present

## 2021-10-09 DIAGNOSIS — D72829 Elevated white blood cell count, unspecified: Secondary | ICD-10-CM | POA: Insufficient documentation

## 2021-10-09 DIAGNOSIS — B965 Pseudomonas (aeruginosa) (mallei) (pseudomallei) as the cause of diseases classified elsewhere: Secondary | ICD-10-CM | POA: Diagnosis present

## 2021-10-09 DIAGNOSIS — Z86711 Personal history of pulmonary embolism: Secondary | ICD-10-CM | POA: Diagnosis not present

## 2021-10-09 DIAGNOSIS — N401 Enlarged prostate with lower urinary tract symptoms: Secondary | ICD-10-CM | POA: Diagnosis present

## 2021-10-09 DIAGNOSIS — N1832 Chronic kidney disease, stage 3b: Secondary | ICD-10-CM | POA: Diagnosis present

## 2021-10-09 DIAGNOSIS — R339 Retention of urine, unspecified: Secondary | ICD-10-CM | POA: Diagnosis present

## 2021-10-09 DIAGNOSIS — N179 Acute kidney failure, unspecified: Secondary | ICD-10-CM | POA: Diagnosis present

## 2021-10-09 DIAGNOSIS — R338 Other retention of urine: Secondary | ICD-10-CM | POA: Diagnosis present

## 2021-10-09 DIAGNOSIS — R413 Other amnesia: Secondary | ICD-10-CM

## 2021-10-09 DIAGNOSIS — Z85038 Personal history of other malignant neoplasm of large intestine: Secondary | ICD-10-CM | POA: Diagnosis not present

## 2021-10-09 LAB — URINALYSIS, ROUTINE W REFLEX MICROSCOPIC
Bilirubin Urine: NEGATIVE
Glucose, UA: 150 mg/dL — AB
Ketones, ur: NEGATIVE mg/dL
Nitrite: NEGATIVE
Protein, ur: 100 mg/dL — AB
RBC / HPF: >50 RBC/hpf — ABNORMAL HIGH (ref 0–5)
Specific Gravity, Urine: 1.012 (ref 1.005–1.030)
Squamous Epithelial / HPF: NONE SEEN (ref 0–5)
WBC, UA: >50 WBC/hpf — ABNORMAL HIGH (ref 0–5)
pH: 6 (ref 5.0–8.0)

## 2021-10-09 LAB — CBC WITH DIFFERENTIAL/PLATELET
Abs Immature Granulocytes: 0.06 10*3/uL (ref 0.00–0.07)
Basophils Absolute: 0.1 10*3/uL (ref 0.0–0.1)
Basophils Relative: 1 %
Eosinophils Absolute: 0.1 10*3/uL (ref 0.0–0.5)
Eosinophils Relative: 1 %
HCT: 42.9 % (ref 39.0–52.0)
Hemoglobin: 14.2 g/dL (ref 13.0–17.0)
Immature Granulocytes: 1 %
Lymphocytes Relative: 10 %
Lymphs Abs: 1.2 10*3/uL (ref 0.7–4.0)
MCH: 29.5 pg (ref 26.0–34.0)
MCHC: 33.1 g/dL (ref 30.0–36.0)
MCV: 89.2 fL (ref 80.0–100.0)
Monocytes Absolute: 1 10*3/uL (ref 0.1–1.0)
Monocytes Relative: 9 %
Neutro Abs: 9.4 10*3/uL — ABNORMAL HIGH (ref 1.7–7.7)
Neutrophils Relative %: 78 %
Platelets: 289 10*3/uL (ref 150–400)
RBC: 4.81 MIL/uL (ref 4.22–5.81)
RDW: 12.5 % (ref 11.5–15.5)
WBC: 11.8 10*3/uL — ABNORMAL HIGH (ref 4.0–10.5)
nRBC: 0 % (ref 0.0–0.2)

## 2021-10-09 LAB — COMPREHENSIVE METABOLIC PANEL
ALT: 16 U/L (ref 0–44)
AST: 17 U/L (ref 15–41)
Albumin: 3.7 g/dL (ref 3.5–5.0)
Alkaline Phosphatase: 59 U/L (ref 38–126)
Anion gap: 9 (ref 5–15)
BUN: 21 mg/dL (ref 8–23)
CO2: 26 mmol/L (ref 22–32)
Calcium: 9.9 mg/dL (ref 8.9–10.3)
Chloride: 98 mmol/L (ref 98–111)
Creatinine, Ser: 1.52 mg/dL — ABNORMAL HIGH (ref 0.61–1.24)
GFR, Estimated: 46 mL/min — ABNORMAL LOW (ref >60)
Glucose, Bld: 262 mg/dL — ABNORMAL HIGH (ref 70–99)
Potassium: 4.5 mmol/L (ref 3.5–5.1)
Sodium: 133 mmol/L — ABNORMAL LOW (ref 135–145)
Total Bilirubin: 0.8 mg/dL (ref 0.3–1.2)
Total Protein: 7.9 g/dL (ref 6.5–8.1)

## 2021-10-09 MED ORDER — OXYCODONE-ACETAMINOPHEN 5-325 MG PO TABS: 1.0000 | ORAL_TABLET | Freq: Four times a day (QID) | ORAL | 0 refills | Status: DC | PRN

## 2021-10-09 MED ORDER — CEPHALEXIN 500 MG PO CAPS: 500.0000 mg | ORAL_CAPSULE | Freq: Four times a day (QID) | ORAL | 0 refills | Status: DC

## 2021-10-09 MED ORDER — OXYCODONE-ACETAMINOPHEN 5-325 MG PO TABS
1.0000 | ORAL_TABLET | Freq: Once | ORAL | Status: AC
  Administered 2021-10-09: 1 via ORAL
  Filled 2021-10-09: qty 1

## 2021-10-09 MED ORDER — ONDANSETRON 4 MG PO TBDP: 4.0000 mg | ORAL_TABLET | Freq: Three times a day (TID) | ORAL | 0 refills | Status: AC | PRN

## 2021-10-09 MED ORDER — FOSFOMYCIN TROMETHAMINE 3 G PO PACK: 3.0000 g | PACK | Freq: Once | ORAL | 0 refills | Status: AC

## 2021-10-09 MED ORDER — CEFTRIAXONE SODIUM 1 G IJ SOLR
1.0000 g | Freq: Once | INTRAMUSCULAR | Status: AC
  Administered 2021-10-09: 1 g via INTRAMUSCULAR
  Filled 2021-10-09: qty 10

## 2021-10-09 MED ORDER — ONDANSETRON 4 MG PO TBDP
4.0000 mg | ORAL_TABLET | Freq: Once | ORAL | Status: AC
  Administered 2021-10-09: 4 mg via ORAL
  Filled 2021-10-09: qty 1

## 2021-10-09 MED ORDER — LIDOCAINE HCL (PF) 1 % IJ SOLN
5.0000 mL | Freq: Once | INTRAMUSCULAR | Status: AC
  Administered 2021-10-09: 5 mL
  Filled 2021-10-09: qty 5

## 2021-10-09 NOTE — Telephone Encounter (Signed)
Patient called back stating he is having severe pain and would like pain medications. I advised pt that he is having bladder spasms and that pain medications will not help, he needs to give myrbetriq time to work. Pt denies fever. Pt states the catheter is draining fine. As per sam if pt is having severe pain he needs to go to ER. Pt verbalized understanding.

## 2021-10-09 NOTE — Discharge Instructions (Addendum)
Take 2 tablets of Keflex in the morning and 2 tablets at night. Please make a follow-up appointment with your urologist. You have also been prescribed a short course of Percocet for pain.  Please start stool softener while taking Percocet to avoid constipation.

## 2021-10-09 NOTE — ED Triage Notes (Signed)
Pt states that he is having pain from his urinary catheter- pt called his urologist and they gave him a new prescription but it did not help- pt states the pain has kept him up all night

## 2021-10-09 NOTE — Telephone Encounter (Signed)
Pt called complaining of burning around the catheter, as per sam call in fosfomycin. Medication sent. Pt aware.

## 2021-10-09 NOTE — Telephone Encounter (Signed)
Copied from [place] [id number]. Topic: General - Inquiry >> Oct 09, 2021  3:39 PM Erskine Squibb wrote: Reason for CRM: Doreen Salvage Nurse Case Manager with [employer] called in concerning the patient and med non compliance and that it maybe why he is at [facility] ED right now. Please contact to discuss

## 2021-10-09 NOTE — ED Notes (Signed)
See triage note  Presents with foley cath in place  States he is having some bladder spasms   States the medication that he was given didn't help  He threw it away

## 2021-10-09 NOTE — Telephone Encounter (Signed)
Spoke with patient he states he is having hematuria in catheter bag and is concerned. Patient also states burning/urgency at tip of penis. Bladder spasms were discussed in detail he was instructed to utilize Myrbetriq samples given to help with this discomfort. Patient was reassured that blood in urine with a catheter in place can happen (red food coloring example given) denies fever, chills, clots and catheter draining fine. Encouraged patient to increase water intake as well. He verbalized understanding

## 2021-10-09 NOTE — ED Provider Triage Note (Signed)
Emergency Medicine Provider Triage Evaluation Note  Justin Griffith , a 81 y.o. male  was evaluated in triage.  Pt complains of suprapubic pain and hematuria.  Patient has a history of urinary tract infections with a Foley catheter in place.  He states that he noticed worsening hematuria over the past 24 hours and suprapubic pain.  He states that he reached out to his urologist who recommended coming into the emergency department.  No fever, chills, flank pain or vomiting.  Review of Systems  Positive: Patient has hematuria.  Negative: No vomiting or fever.   Physical Exam  There were no vitals taken for this visit. Gen:   Awake, no distress   Resp:  Normal effort  MSK:   Moves extremities without difficulty  Other:    Medical Decision Making  Medically screening exam initiated at 2:34 PM.  Appropriate orders placed.  DRU PRIMEAU was informed that the remainder of the evaluation will be completed by another provider, this initial triage assessment does not replace that evaluation, and the importance of remaining in the ED until their evaluation is complete.     [name], [place] 10/09/21 1435

## 2021-10-09 NOTE — Telephone Encounter (Signed)
Please find out what they need to discuss, unfortunately I do not know this patient and am not able to give any additional information.

## 2021-10-09 NOTE — ED Provider Notes (Signed)
[facility] Provider Note  Patient Contact: 3:35 PM (approximate)   History   Groin Pain   HPI  Justin Griffith is a 81 y.o. male with history of urinary tract infections with in place Foley, presents to the emergency department with new onset hematuria and suprapubic pain.  On exam, patient is alert, active and nontoxic-appearing.  He denies flank pain, fever or abdominal pain.  No chest pain, chest tightness or shortness of breath.      Physical Exam   Triage Vital Signs: ED Triage Vitals  Enc Vitals Group     BP 10/09/21 1436 (!) 211/100     Pulse Rate 10/09/21 1436 88     Resp 10/09/21 1436 18     Temp 10/09/21 1441 98.1 F (36.7 C)     Temp Source 10/09/21 1441 Oral     SpO2 10/09/21 1436 95 %     Weight 10/09/21 1434 255 lb (115.7 kg)     Height 10/09/21 1434 '5\' 11"'$  (1.803 m)     Head Circumference --      Peak Flow --      Pain Score 10/09/21 1434 9     Pain Loc --      Pain Edu? --      Excl. in [place]? --     Most recent vital signs: Vitals:   10/09/21 1436 10/09/21 1441  BP: (!) 211/100   Pulse: 88   Resp: 18   Temp:  98.1 F (36.7 C)  SpO2: 95%      General: Alert and in no acute distress. Eyes:  PERRL. EOMI. Head: No acute traumatic findings ENT:      Nose: No congestion/rhinnorhea.      Mouth/Throat: Mucous membranes are moist. Neck: No stridor. No cervical spine tenderness to palpation. Cardiovascular:  Good peripheral perfusion Respiratory: Normal respiratory effort without tachypnea or retractions. Lungs CTAB. Good air entry to the bases with no decreased or absent breath sounds. Gastrointestinal: Bowel sounds 4 quadrants. Soft and nontender to palpation. No guarding or rigidity. No palpable masses. No distention. No CVA tenderness. Musculoskeletal: Full range of motion to all extremities.  Neurologic:  No gross focal neurologic deficits are appreciated.  Skin:   No rash noted Other:   ED Results / Procedures /  Treatments   Labs (all labs ordered are listed, but only abnormal results are displayed) Labs Reviewed  CBC WITH DIFFERENTIAL/PLATELET - Abnormal; Notable for the following components:      Result Value   WBC 11.8 (*)    Neutro Abs 9.4 (*)    All other components within normal limits  COMPREHENSIVE METABOLIC PANEL - Abnormal; Notable for the following components:   Sodium 133 (*)    Glucose, Bld 262 (*)    Creatinine, Ser 1.52 (*)    GFR, Estimated 46 (*)    All other components within normal limits  URINALYSIS, ROUTINE W REFLEX MICROSCOPIC - Abnormal; Notable for the following components:   Color, Urine AMBER (*)    APPearance TURBID (*)    Glucose, UA 150 (*)    Hgb urine dipstick LARGE (*)    Protein, ur 100 (*)    Leukocytes,Ua LARGE (*)    RBC / HPF >50 (*)    WBC, UA >50 (*)    Bacteria, UA MANY (*)    All other components within normal limits  URINE CULTURE       PROCEDURES:  Critical Care performed: No  Procedures   MEDICATIONS ORDERED IN ED: Medications  cefTRIAXone (ROCEPHIN) injection 1 g (has no administration in time range)  oxyCODONE-acetaminophen (PERCOCET/ROXICET) 5-325 MG per tablet 1 tablet (has no administration in time range)  ondansetron (ZOFRAN-ODT) disintegrating tablet 4 mg (has no administration in time range)  lidocaine (PF) (XYLOCAINE) 1 % injection 5 mL (has no administration in time range)     IMPRESSION / MDM / ASSESSMENT AND PLAN / ED COURSE  I reviewed the triage vital signs and the nursing notes.                             Assessment and plan UTI:  81 year old male presents to the emergency department with hematuria and suprapubic pain.  Patient was hypertensive at triage but vital signs were otherwise reassuring.  He was alert and nontoxic-appearing on exam with some suprapubic discomfort but no other constitutional complaints.  CBC indicated mildly elevated white blood cell count.  Patient's creatinine and GFR consistent  with baseline.  Urinalysis concerning for UTI with large leuks, greater than 50 RBCs and many bacteria.  Urine culture in process.  Will change out Foley catheter and give IM Rocephin and discharged home with Keflex.  We will also prescribe patient a short course of Percocet for pain.   FINAL CLINICAL IMPRESSION(S) / ED DIAGNOSES   Final diagnoses:  Acute cystitis with hematuria     Rx / DC Orders   ED Discharge Orders          Ordered    cephALEXin (KEFLEX) 500 MG capsule  4 times daily        10/09/21 1539    oxyCODONE-acetaminophen (PERCOCET/ROXICET) 5-325 MG tablet  Every 6 hours PRN        10/09/21 1540    ondansetron (ZOFRAN-ODT) 4 MG disintegrating tablet  Every 8 hours PRN        10/09/21 1540             Note:  This document was prepared using Dragon voice recognition software and may include unintentional dictation errors.   Vallarie Mare [place], PA-C 10/09/21 1541    Blake Divine, MD 10/09/21 [place]

## 2021-10-10 NOTE — Telephone Encounter (Signed)
I will give him a call to see if he can come in next week, I think he already has home heath

## 2021-10-10 NOTE — Telephone Encounter (Signed)
Patient will be here on Monday at 1:40

## 2021-10-11 ENCOUNTER — Other Ambulatory Visit: Payer: Self-pay

## 2021-10-11 ENCOUNTER — Emergency Department
Admission: EM | Admit: 2021-10-11 | Discharge: 2021-10-11 | Disposition: A | Discharge status: Home / Self Care | Payer: HMO | Source: Home / Self Care | Attending: Emergency Medicine | Admitting: Emergency Medicine

## 2021-10-11 ENCOUNTER — Encounter: Payer: Self-pay | Admitting: Intensive Care

## 2021-10-11 ENCOUNTER — Inpatient Hospital Stay
Admission: EM | Admit: 2021-10-11 | Discharge: 2021-10-13 | DRG: 690 | Disposition: A | Discharge status: Home / Self Care | Payer: HMO | Attending: Obstetrics and Gynecology | Admitting: Obstetrics and Gynecology

## 2021-10-11 DIAGNOSIS — I10 Essential (primary) hypertension: Secondary | ICD-10-CM | POA: Insufficient documentation

## 2021-10-11 DIAGNOSIS — Z833 Family history of diabetes mellitus: Secondary | ICD-10-CM

## 2021-10-11 DIAGNOSIS — E669 Obesity, unspecified: Secondary | ICD-10-CM | POA: Diagnosis present

## 2021-10-11 DIAGNOSIS — I129 Hypertensive chronic kidney disease with stage 1 through stage 4 chronic kidney disease, or unspecified chronic kidney disease: Secondary | ICD-10-CM | POA: Diagnosis present

## 2021-10-11 DIAGNOSIS — R310 Gross hematuria: Secondary | ICD-10-CM | POA: Diagnosis present

## 2021-10-11 DIAGNOSIS — Z9049 Acquired absence of other specified parts of digestive tract: Secondary | ICD-10-CM

## 2021-10-11 DIAGNOSIS — Z86711 Personal history of pulmonary embolism: Secondary | ICD-10-CM

## 2021-10-11 DIAGNOSIS — Z7984 Long term (current) use of oral hypoglycemic drugs: Secondary | ICD-10-CM

## 2021-10-11 DIAGNOSIS — M1990 Unspecified osteoarthritis, unspecified site: Secondary | ICD-10-CM | POA: Diagnosis present

## 2021-10-11 DIAGNOSIS — Z87440 Personal history of urinary (tract) infections: Secondary | ICD-10-CM

## 2021-10-11 DIAGNOSIS — N3001 Acute cystitis with hematuria: Secondary | ICD-10-CM

## 2021-10-11 DIAGNOSIS — N401 Enlarged prostate with lower urinary tract symptoms: Secondary | ICD-10-CM | POA: Diagnosis present

## 2021-10-11 DIAGNOSIS — Z888 Allergy status to other drugs, medicaments and biological substances status: Secondary | ICD-10-CM

## 2021-10-11 DIAGNOSIS — R338 Other retention of urine: Secondary | ICD-10-CM

## 2021-10-11 DIAGNOSIS — J45909 Unspecified asthma, uncomplicated: Secondary | ICD-10-CM | POA: Insufficient documentation

## 2021-10-11 DIAGNOSIS — N179 Acute kidney failure, unspecified: Secondary | ICD-10-CM | POA: Diagnosis present

## 2021-10-11 DIAGNOSIS — Z951 Presence of aortocoronary bypass graft: Secondary | ICD-10-CM

## 2021-10-11 DIAGNOSIS — R319 Hematuria, unspecified: Principal | ICD-10-CM

## 2021-10-11 DIAGNOSIS — I2510 Atherosclerotic heart disease of native coronary artery without angina pectoris: Secondary | ICD-10-CM | POA: Diagnosis present

## 2021-10-11 DIAGNOSIS — B952 Enterococcus as the cause of diseases classified elsewhere: Secondary | ICD-10-CM | POA: Diagnosis present

## 2021-10-11 DIAGNOSIS — J449 Chronic obstructive pulmonary disease, unspecified: Secondary | ICD-10-CM | POA: Insufficient documentation

## 2021-10-11 DIAGNOSIS — I739 Peripheral vascular disease, unspecified: Secondary | ICD-10-CM | POA: Diagnosis present

## 2021-10-11 DIAGNOSIS — N138 Other obstructive and reflux uropathy: Secondary | ICD-10-CM

## 2021-10-11 DIAGNOSIS — E1122 Type 2 diabetes mellitus with diabetic chronic kidney disease: Secondary | ICD-10-CM | POA: Diagnosis present

## 2021-10-11 DIAGNOSIS — J439 Emphysema, unspecified: Secondary | ICD-10-CM | POA: Diagnosis present

## 2021-10-11 DIAGNOSIS — G4730 Sleep apnea, unspecified: Secondary | ICD-10-CM | POA: Diagnosis present

## 2021-10-11 DIAGNOSIS — E785 Hyperlipidemia, unspecified: Secondary | ICD-10-CM | POA: Diagnosis present

## 2021-10-11 DIAGNOSIS — D6832 Hemorrhagic disorder due to extrinsic circulating anticoagulants: Secondary | ICD-10-CM | POA: Diagnosis present

## 2021-10-11 DIAGNOSIS — Z79899 Other long term (current) drug therapy: Secondary | ICD-10-CM

## 2021-10-11 DIAGNOSIS — N390 Urinary tract infection, site not specified: Secondary | ICD-10-CM

## 2021-10-11 DIAGNOSIS — B965 Pseudomonas (aeruginosa) (mallei) (pseudomallei) as the cause of diseases classified elsewhere: Secondary | ICD-10-CM | POA: Diagnosis present

## 2021-10-11 DIAGNOSIS — E119 Type 2 diabetes mellitus without complications: Secondary | ICD-10-CM

## 2021-10-11 DIAGNOSIS — Z794 Long term (current) use of insulin: Secondary | ICD-10-CM

## 2021-10-11 DIAGNOSIS — Z7982 Long term (current) use of aspirin: Secondary | ICD-10-CM

## 2021-10-11 DIAGNOSIS — Z87891 Personal history of nicotine dependence: Secondary | ICD-10-CM

## 2021-10-11 DIAGNOSIS — E1151 Type 2 diabetes mellitus with diabetic peripheral angiopathy without gangrene: Secondary | ICD-10-CM | POA: Diagnosis present

## 2021-10-11 DIAGNOSIS — Z96653 Presence of artificial knee joint, bilateral: Secondary | ICD-10-CM | POA: Diagnosis present

## 2021-10-11 DIAGNOSIS — Z8673 Personal history of transient ischemic attack (TIA), and cerebral infarction without residual deficits: Secondary | ICD-10-CM

## 2021-10-11 DIAGNOSIS — N1832 Chronic kidney disease, stage 3b: Secondary | ICD-10-CM | POA: Diagnosis present

## 2021-10-11 DIAGNOSIS — Z85038 Personal history of other malignant neoplasm of large intestine: Secondary | ICD-10-CM

## 2021-10-11 DIAGNOSIS — R339 Retention of urine, unspecified: Secondary | ICD-10-CM | POA: Diagnosis present

## 2021-10-11 DIAGNOSIS — Z7901 Long term (current) use of anticoagulants: Secondary | ICD-10-CM

## 2021-10-11 DIAGNOSIS — Z955 Presence of coronary angioplasty implant and graft: Secondary | ICD-10-CM

## 2021-10-11 DIAGNOSIS — T45515A Adverse effect of anticoagulants, initial encounter: Secondary | ICD-10-CM | POA: Diagnosis present

## 2021-10-11 DIAGNOSIS — Z9221 Personal history of antineoplastic chemotherapy: Secondary | ICD-10-CM

## 2021-10-11 DIAGNOSIS — Z91148 Patient's other noncompliance with medication regimen for other reason: Secondary | ICD-10-CM

## 2021-10-11 DIAGNOSIS — Z7951 Long term (current) use of inhaled steroids: Secondary | ICD-10-CM

## 2021-10-11 LAB — COMPREHENSIVE METABOLIC PANEL
ALT: 14 U/L (ref 0–44)
AST: 17 U/L (ref 15–41)
Albumin: 3.4 g/dL — ABNORMAL LOW (ref 3.5–5.0)
Alkaline Phosphatase: 53 U/L (ref 38–126)
Anion gap: 8 (ref 5–15)
BUN: 20 mg/dL (ref 8–23)
CO2: 26 mmol/L (ref 22–32)
Calcium: 9.8 mg/dL (ref 8.9–10.3)
Chloride: 99 mmol/L (ref 98–111)
Creatinine, Ser: 1.64 mg/dL — ABNORMAL HIGH (ref 0.61–1.24)
GFR, Estimated: 42 mL/min — ABNORMAL LOW (ref >60)
Glucose, Bld: 230 mg/dL — ABNORMAL HIGH (ref 70–99)
Potassium: 4.3 mmol/L (ref 3.5–5.1)
Sodium: 133 mmol/L — ABNORMAL LOW (ref 135–145)
Total Bilirubin: 0.8 mg/dL (ref 0.3–1.2)
Total Protein: 7.9 g/dL (ref 6.5–8.1)

## 2021-10-11 LAB — CBC WITH DIFFERENTIAL/PLATELET
Abs Immature Granulocytes: 0.06 10*3/uL (ref 0.00–0.07)
Basophils Absolute: 0.1 10*3/uL (ref 0.0–0.1)
Basophils Relative: 1 %
Eosinophils Absolute: 0.2 10*3/uL (ref 0.0–0.5)
Eosinophils Relative: 2 %
HCT: 44.2 % (ref 39.0–52.0)
Hemoglobin: 14.3 g/dL (ref 13.0–17.0)
Immature Granulocytes: 1 %
Lymphocytes Relative: 16 %
Lymphs Abs: 1.8 10*3/uL (ref 0.7–4.0)
MCH: 29.1 pg (ref 26.0–34.0)
MCHC: 32.4 g/dL (ref 30.0–36.0)
MCV: 90 fL (ref 80.0–100.0)
Monocytes Absolute: 1 10*3/uL (ref 0.1–1.0)
Monocytes Relative: 8 %
Neutro Abs: 8.4 10*3/uL — ABNORMAL HIGH (ref 1.7–7.7)
Neutrophils Relative %: 72 %
Platelets: 299 10*3/uL (ref 150–400)
RBC: 4.91 MIL/uL (ref 4.22–5.81)
RDW: 12.9 % (ref 11.5–15.5)
WBC: 11.5 10*3/uL — ABNORMAL HIGH (ref 4.0–10.5)
nRBC: 0 % (ref 0.0–0.2)

## 2021-10-11 MED ORDER — ONDANSETRON HCL 4 MG/2ML IJ SOLN: 4.0000 mg | Freq: Once | INTRAMUSCULAR | Status: DC

## 2021-10-11 MED ORDER — OXYCODONE-ACETAMINOPHEN 7.5-325 MG PO TABS
1.0000 | ORAL_TABLET | Freq: Once | ORAL | Status: AC
  Administered 2021-10-11: 1 via ORAL
  Filled 2021-10-11: qty 1

## 2021-10-11 MED ORDER — MORPHINE SULFATE (PF) 4 MG/ML IV SOLN: 4.0000 mg | INTRAVENOUS | Status: DC | PRN

## 2021-10-11 MED ORDER — CIPROFLOXACIN HCL 500 MG PO TABS: 500.0000 mg | ORAL_TABLET | Freq: Two times a day (BID) | ORAL | 0 refills | Status: DC

## 2021-10-11 MED ORDER — LIDOCAINE HCL URETHRAL/MUCOSAL 2 % EX GEL
1.0000 | Freq: Once | CUTANEOUS | Status: DC
  Filled 2021-10-11: qty 10

## 2021-10-11 MED ORDER — LIDOCAINE HCL URETHRAL/MUCOSAL 2 % EX GEL
1.0000 | Freq: Once | CUTANEOUS | Status: AC
  Administered 2021-10-11: 1 via URETHRAL

## 2021-10-11 MED ORDER — OXYBUTYNIN CHLORIDE 2.5 MG PO TABS: 1.0000 | ORAL_TABLET | Freq: Every day | ORAL | 0 refills | Status: DC

## 2021-10-11 MED ORDER — SODIUM CHLORIDE 0.9 % IR SOLN
3000.0000 mL | Status: DC
  Administered 2021-10-12: 3000 mL

## 2021-10-11 MED ORDER — CIPROFLOXACIN HCL 500 MG PO TABS
500.0000 mg | ORAL_TABLET | Freq: Once | ORAL | Status: AC
  Administered 2021-10-11: 500 mg via ORAL
  Filled 2021-10-11: qty 1

## 2021-10-11 NOTE — ED Notes (Addendum)
First RN Note: Pt to ED via POV with c/o increased bladder pain at this time, pt states having bladder spasms at this time.Pt seen earlier today for same.

## 2021-10-11 NOTE — ED Notes (Signed)
Attempted bladder scan. 34m seen on scan. Scant bloody urine seen in foley bag. Pt states foley has been in place for 5-6 weeks. Drainage tubing and collection bag changed. Long thin clot noted inside of drainage tubing. Pt complains of 10/10 pain to lower abdomen, states urination into foley feels "burning" and painful.

## 2021-10-11 NOTE — ED Provider Triage Note (Signed)
Emergency Medicine Provider Triage Evaluation Note  ELGIN CARN , a 81 y.o. male  was evaluated in triage.  Pt complains of urinary pain. Was seen in the ER 7/20 and dx with acute cystitis with hematuria.  Patient states that he was not aware that medication was sent to his pharmacy.    Review of Systems  Positive: Urianry pain Negative: No fever known.    Physical Exam  There were no vitals taken for this visit. Gen:   Awake, no distress   Resp:  Normal effort  MSK:   Moves extremities without difficulty  Other:    Medical Decision Making  Medically screening exam initiated at 2:05 PM.  Appropriate orders placed.  ROBI DEWOLFE was informed that the remainder of the evaluation will be completed by another provider, this initial triage assessment does not replace that evaluation, and the importance of remaining in the ED until their evaluation is complete.     Johnn Hai, PA-C 10/11/21 1435

## 2021-10-11 NOTE — ED Triage Notes (Signed)
Patient arrived by POV with wife from home with c/o pain. Patient arrived with foley catheter that he reports has been in for weeks. Urine noted to be bloody. Patient has seen urology and given prescription that he reports did not help and was seen here 2 days ago for same. Reports he was given a pain pill and then sent home with no prescription.  When looking into his records patient was sent pain prescription to [facility] court drug.

## 2021-10-11 NOTE — ED Notes (Signed)
Pt foley flushed with 60 ml of saline. 60 ml's returned. No relief for pt No clots produced or flow of urine.

## 2021-10-11 NOTE — ED Provider Notes (Signed)
Patient Contact: 3:36 PM (approximate)   History   No chief complaint on file.   HPI  Justin Griffith is a 81 y.o. male with a history of type 2 diabetes, COPD, asthma and hypertension with current Foley in place presents to the emergency department for reevaluation.  Patient was seen 720 and was diagnosed with a urinary tract infection and treated with Keflex until culture returned.  Patient also received an injection of Rocephin.  Patient states that he never received a phone call from [facility] and was not aware that antibiotics were called into his pharmacy.  He denies fever, vomiting or chills.  Patient's Foley catheter was replaced during last emergency department encounter.      Physical Exam   Triage Vital Signs: ED Triage Vitals [10/11/21 1409]  Enc Vitals Group     BP (!) 214/104     Pulse Rate 86     Resp 16     Temp 98.4 F (36.9 C)     Temp Source Oral     SpO2 95 %     Weight 253 lb 8.5 oz (115 kg)     Height '5\' 11"'$  (1.803 m)     Head Circumference      Peak Flow      Pain Score 10     Pain Loc      Pain Edu?      Excl. in [place]?     Most recent vital signs: Vitals:   10/11/21 1409  BP: (!) 214/104  Pulse: 86  Resp: 16  Temp: 98.4 F (36.9 C)  SpO2: 95%     General: Alert and in no acute distress. Eyes:  PERRL. EOMI. Head: No acute traumatic findings ENT:      Nose: No congestion/rhinnorhea.      Mouth/Throat: Mucous membranes are moist. Neck: No stridor. No cervical spine tenderness to palpation. Cardiovascular:  Good peripheral perfusion Respiratory: Normal respiratory effort without tachypnea or retractions. Lungs CTAB. Good air entry to the bases with no decreased or absent breath sounds. Gastrointestinal: Bowel sounds 4 quadrants. Soft and nontender to palpation. No guarding or rigidity. No palpable masses. No distention. No CVA tenderness. Musculoskeletal: Full range of motion to all extremities.  Neurologic:  No gross focal  neurologic deficits are appreciated.  Skin:   No rash noted    ED Results / Procedures / Treatments   Labs (all labs ordered are listed, but only abnormal results are displayed) Labs Reviewed - No data to display      PROCEDURES:  Critical Care performed: No  Procedures   MEDICATIONS ORDERED IN ED: Medications  ciprofloxacin (CIPRO) tablet 500 mg (has no administration in time range)  oxyCODONE-acetaminophen (PERCOCET) 7.5-325 MG per tablet 1 tablet (1 tablet Oral Given 10/11/21 1413)     IMPRESSION / MDM / ASSESSMENT AND PLAN / ED COURSE  I reviewed the triage vital signs and the nursing notes.                              Assessment and plan UTI 81 year old male with history of recently diagnosed urinary tract infection, presents to the emergency department today after he states that he did not pick up his medication.  Patient was notably hypertensive at triage but vital signs were otherwise reassuring.  Patient was able to provide historical details.   I reviewed labs from prior emergency department encounter and patient's renal  function is consistent with baseline.  I reviewed patient's urine culture and Cipro was sensitive.  Patient's urine culture grew Pseudomonas Aeruginosa. I discontinued patient's Keflex and patient will be started on Cipro.   I discussed the importance of following up with PCP to address hypertension. Patient states that he had his morning blood pressure medication.   FINAL CLINICAL IMPRESSION(S) / ED DIAGNOSES   Final diagnoses:  Acute cystitis with hematuria     Rx / DC Orders   ED Discharge Orders          Ordered    ciprofloxacin (CIPRO) 500 MG tablet  2 times daily        10/11/21 1536    Oxybutynin Chloride 2.5 MG TABS  Daily        10/11/21 1536             Note:  This document was prepared using Dragon voice recognition software and may include unintentional dictation errors.   Vallarie Mare Baneberry, Hershal Coria 10/11/21  1556    Arta Silence, MD 10/12/21 [phone]

## 2021-10-11 NOTE — ED Notes (Signed)
Spoke with [place] MD for pain medication. Given verbal to irrigate cathter.

## 2021-10-11 NOTE — ED Provider Notes (Incomplete)
[facility] Provider Note    Event Date/Time   First MD Initiated Contact with Patient 10/11/21 2221     (approximate)   History   Hematuria   HPI  Justin Griffith is a 81 y.o. male who presents to the ER for evaluation of suprapubic pain.  Patient has been in the ER a few times over the past several days due to dysuria hematuria had Foley catheter placed.  Headaches changed today with worsening pain and the catheter is not draining.  He is on blood thinners.  Was pseudoword Keflex previously had urine culture suggestion changed to Cipro.  Patient arrives to the ER very uncomfortable     Physical Exam   Triage Vital Signs: ED Triage Vitals  Enc Vitals Group     BP 10/11/21 2012 (!) 145/80     Pulse Rate 10/11/21 2012 82     Resp 10/11/21 2012 (!) 21     Temp 10/11/21 2012 98 F (36.7 C)     Temp Source 10/11/21 2012 Oral     SpO2 10/11/21 2012 91 %     Weight 10/11/21 2024 253 lb 8.5 oz (115 kg)     Height 10/11/21 2024 '5\' 11"'$  (1.803 m)     Head Circumference --      Peak Flow --      Pain Score 10/11/21 2024 10     Pain Loc --      Pain Edu? --      Excl. in [place]? --     Most recent vital signs: Vitals:   10/11/21 2012 10/11/21 2028  BP: (!) 145/80   Pulse: 82   Resp: (!) 21   Temp: 98 F (36.7 C)   SpO2: 91% 94%     Constitutional: Alert  Eyes: Conjunctivae are normal.  Head: Atraumatic. Nose: No congestion/rhinnorhea. Mouth/Throat: Mucous membranes are moist.   Neck: Painless ROM.  Cardiovascular:   Good peripheral circulation. Respiratory: Normal respiratory effort.  No retractions.  Gastrointestinal: Very tender distended bladder above the umbilicus. Musculoskeletal:  no deformity Neurologic:  MAE spontaneously. No gross focal neurologic deficits are appreciated.  Skin:  Skin is warm, dry and intact. No rash noted. Psychiatric: Mood and affect are normal. Speech and behavior are normal.    ED Results / Procedures /  Treatments   Labs (all labs ordered are listed, but only abnormal results are displayed) Labs Reviewed  CBC WITH DIFFERENTIAL/PLATELET - Abnormal; Notable for the following components:      Result Value   WBC 11.5 (*)    Neutro Abs 8.4 (*)    All other components within normal limits  COMPREHENSIVE METABOLIC PANEL - Abnormal; Notable for the following components:   Sodium 133 (*)    Glucose, Bld 230 (*)    Creatinine, Ser 1.64 (*)    Albumin 3.4 (*)    GFR, Estimated 42 (*)    All other components within normal limits      RADIOLOGY EMERGENCY DEPARTMENT ULTRASOUND  Study: Limited Ultrasound of Bladder  INDICATIONS: to assess for urinary retention and/or bladder volume prior to urinary catheter Multiple views of the bladder were obtained in real-time in the transverse and longitudinal planes with a multi-frequency probe.  PERFORMED BY: Myself IMAGES ARCHIVED?: No LIMITATIONS:  none INTERPRETATION: Large Volume        PROCEDURES:  Critical Care performed: No  Procedures   MEDICATIONS ORDERED IN ED: Medications  sodium chloride irrigation 0.9 % 3,000 mL (  has no administration in time range)     IMPRESSION / MDM / ASSESSMENT AND PLAN / ED COURSE  I reviewed the triage vital signs and the nursing notes.                              Differential diagnosis includes, but is not limited to, urinary retention, gross hematuria, catheter displacement, prostatitis, UTI  Patient presents to the ER for evaluation of symptoms as described above.   Clinical Course as of 10/11/21 2352  Sat Oct 11, 2021  2330 With significant improvement after insertion of through a 20-gauge Foley.  Initially had some large clot then clearing to normal urine.  Patient's pain improved.  Given clot burden will order CBI as he is on blood thinners to make sure that he is not having additional bleeding. [PR]    Clinical Course User Index [PR] Merlyn Lot, MD    Patient's  presentation is most consistent with {EM COPA:27473}   FINAL CLINICAL IMPRESSION(S) / ED DIAGNOSES   Final diagnoses:  None     Rx / DC Orders   ED Discharge Orders     None        Note:  This document was prepared using Dragon voice recognition software and may include unintentional dictation errors.

## 2021-10-11 NOTE — ED Triage Notes (Signed)
Pt states he was here this morning for blood in urine and foley not draining. Pt states he had urine draining earlier today but it has stopped and he is having severe abd pain.

## 2021-10-11 NOTE — ED Provider Notes (Signed)
[facility] Provider Note    Event Date/Time   First MD Initiated Contact with Patient 10/11/21 2221     (approximate)   History   Hematuria   HPI  Justin Griffith is a 81 y.o. male who presents to the ER for evaluation of suprapubic pain.  Patient has been in the ER a few times over the past several days due to dysuria hematuria had Foley catheter placed.  Headaches changed today with worsening pain and the catheter is not draining.  He is on blood thinners.  Was pseudoword Keflex previously had urine culture suggestion changed to Cipro.  Patient arrives to the ER very uncomfortable     Physical Exam   Triage Vital Signs: ED Triage Vitals  Enc Vitals Group     BP 10/11/21 2012 (!) 145/80     Pulse Rate 10/11/21 2012 82     Resp 10/11/21 2012 (!) 21     Temp 10/11/21 2012 98 F (36.7 C)     Temp Source 10/11/21 2012 Oral     SpO2 10/11/21 2012 91 %     Weight 10/11/21 2024 253 lb 8.5 oz (115 kg)     Height 10/11/21 2024 '5\' 11"'$  (1.803 m)     Head Circumference --      Peak Flow --      Pain Score 10/11/21 2024 10     Pain Loc --      Pain Edu? --      Excl. in [place]? --     Most recent vital signs: Vitals:   10/11/21 2012 10/11/21 2028  BP: (!) 145/80   Pulse: 82   Resp: (!) 21   Temp: 98 F (36.7 C)   SpO2: 91% 94%     Constitutional: Alert  Eyes: Conjunctivae are normal.  Head: Atraumatic. Nose: No congestion/rhinnorhea. Mouth/Throat: Mucous membranes are moist.   Neck: Painless ROM.  Cardiovascular:   Good peripheral circulation. Respiratory: Normal respiratory effort.  No retractions.  Gastrointestinal: Very tender distended bladder above the umbilicus. Musculoskeletal:  no deformity Neurologic:  MAE spontaneously. No gross focal neurologic deficits are appreciated.  Skin:  Skin is warm, dry and intact. No rash noted. Psychiatric: Mood and affect are normal. Speech and behavior are normal.    ED Results / Procedures /  Treatments   Labs (all labs ordered are listed, but only abnormal results are displayed) Labs Reviewed  CBC WITH DIFFERENTIAL/PLATELET - Abnormal; Notable for the following components:      Result Value   WBC 11.5 (*)    Neutro Abs 8.4 (*)    All other components within normal limits  COMPREHENSIVE METABOLIC PANEL - Abnormal; Notable for the following components:   Sodium 133 (*)    Glucose, Bld 230 (*)    Creatinine, Ser 1.64 (*)    Albumin 3.4 (*)    GFR, Estimated 42 (*)    All other components within normal limits      RADIOLOGY EMERGENCY DEPARTMENT ULTRASOUND  Study: Limited Ultrasound of Bladder  INDICATIONS: to assess for urinary retention and/or bladder volume prior to urinary catheter Multiple views of the bladder were obtained in real-time in the transverse and longitudinal planes with a multi-frequency probe.  PERFORMED BY: Myself IMAGES ARCHIVED?: No LIMITATIONS:  none INTERPRETATION: Large Volume        PROCEDURES:  Critical Care performed: No  Procedures   MEDICATIONS ORDERED IN ED: Medications  sodium chloride irrigation 0.9 % 3,000 mL (  has no administration in time range)  lidocaine (XYLOCAINE) 2 % jelly 1 Application (1 Application Urethral Not Given 10/11/21 2330)  morphine (PF) 4 MG/ML injection 4 mg (has no administration in time range)  ondansetron (ZOFRAN) injection 4 mg (has no administration in time range)  lidocaine (XYLOCAINE) 2 % jelly 1 Application (1 Application Urethral Given 10/11/21 2330)     IMPRESSION / MDM / ASSESSMENT AND PLAN / ED COURSE  I reviewed the triage vital signs and the nursing notes.                              Differential diagnosis includes, but is not limited to, urinary retention, gross hematuria, catheter displacement, prostatitis, UTI  Patient presents to the ER for evaluation of symptoms as described above.  This presenting complaint could reflect a potentially life-threatening illness therefore the  patient will be placed on continuous pulse oximetry and telemetry for monitoring.  Laboratory evaluation will be sent to evaluate for the above complaints.  Patient very uncomfortable appearing ultrasound showing large volume bladder and unable to identify Foley catheter somewhat limited due to habitus.  Unable to flush catheter has lots of blood and clot around the Foley catheter will exchanged for three-way given concern for clot and hematuria causing obstruction.    Clinical Course as of 10/12/21 0015  Sat Oct 11, 2021  2330 With significant improvement after insertion of through a 20-gauge Foley.  Initially had some large clot then clearing to normal urine.  Patient's pain improved.  Given clot burden will order CBI as he is on blood thinners to make sure that he is not having additional bleeding. [PR]  Sun Oct 12, 2021  0005 Patient had 2 L out from catheter.  Patient be signed out to oncoming physician pending reassessment. [PR]    Clinical Course User Index [PR] Merlyn Lot, MD    FINAL CLINICAL IMPRESSION(S) / ED DIAGNOSES   Final diagnoses:  Hematuria, unspecified type  Acute urinary retention     Rx / DC Orders   ED Discharge Orders     None        Note:  This document was prepared using Dragon voice recognition software and may include unintentional dictation errors.    Merlyn Lot, MD 10/12/21 [phone]

## 2021-10-11 NOTE — Discharge Instructions (Signed)
Take Cipro twice daily for ten days. Please talk to your primary doctor about your blood pressure, which was elevated here today. I have prescribed you pain medicine (Roxicet) and a medicine to help with bladder spasms (Oxybutynin). You can take Roxicet every six hours for pain and you can take Oxybutynin once daily.  You can take the Roxicet for three days and the Oxybutynin for 5 days.

## 2021-10-12 DIAGNOSIS — N3001 Acute cystitis with hematuria: Secondary | ICD-10-CM | POA: Diagnosis present

## 2021-10-12 DIAGNOSIS — Z7901 Long term (current) use of anticoagulants: Secondary | ICD-10-CM

## 2021-10-12 DIAGNOSIS — Z91148 Patient's other noncompliance with medication regimen for other reason: Secondary | ICD-10-CM | POA: Diagnosis not present

## 2021-10-12 DIAGNOSIS — Z87440 Personal history of urinary (tract) infections: Secondary | ICD-10-CM

## 2021-10-12 DIAGNOSIS — R310 Gross hematuria: Secondary | ICD-10-CM

## 2021-10-12 DIAGNOSIS — E785 Hyperlipidemia, unspecified: Secondary | ICD-10-CM | POA: Diagnosis present

## 2021-10-12 DIAGNOSIS — R338 Other retention of urine: Secondary | ICD-10-CM | POA: Diagnosis present

## 2021-10-12 DIAGNOSIS — D6832 Hemorrhagic disorder due to extrinsic circulating anticoagulants: Secondary | ICD-10-CM | POA: Diagnosis present

## 2021-10-12 DIAGNOSIS — Z85038 Personal history of other malignant neoplasm of large intestine: Secondary | ICD-10-CM | POA: Diagnosis not present

## 2021-10-12 DIAGNOSIS — E1122 Type 2 diabetes mellitus with diabetic chronic kidney disease: Secondary | ICD-10-CM | POA: Diagnosis present

## 2021-10-12 DIAGNOSIS — N179 Acute kidney failure, unspecified: Secondary | ICD-10-CM | POA: Diagnosis present

## 2021-10-12 DIAGNOSIS — Z9221 Personal history of antineoplastic chemotherapy: Secondary | ICD-10-CM | POA: Diagnosis not present

## 2021-10-12 DIAGNOSIS — B952 Enterococcus as the cause of diseases classified elsewhere: Secondary | ICD-10-CM | POA: Diagnosis present

## 2021-10-12 DIAGNOSIS — E1151 Type 2 diabetes mellitus with diabetic peripheral angiopathy without gangrene: Secondary | ICD-10-CM | POA: Diagnosis present

## 2021-10-12 DIAGNOSIS — R339 Retention of urine, unspecified: Secondary | ICD-10-CM

## 2021-10-12 DIAGNOSIS — N1832 Chronic kidney disease, stage 3b: Secondary | ICD-10-CM | POA: Diagnosis present

## 2021-10-12 DIAGNOSIS — B965 Pseudomonas (aeruginosa) (mallei) (pseudomallei) as the cause of diseases classified elsewhere: Secondary | ICD-10-CM | POA: Diagnosis present

## 2021-10-12 DIAGNOSIS — N401 Enlarged prostate with lower urinary tract symptoms: Secondary | ICD-10-CM | POA: Diagnosis present

## 2021-10-12 DIAGNOSIS — Z86711 Personal history of pulmonary embolism: Secondary | ICD-10-CM | POA: Diagnosis not present

## 2021-10-12 DIAGNOSIS — J439 Emphysema, unspecified: Secondary | ICD-10-CM | POA: Diagnosis present

## 2021-10-12 DIAGNOSIS — I2510 Atherosclerotic heart disease of native coronary artery without angina pectoris: Secondary | ICD-10-CM | POA: Diagnosis present

## 2021-10-12 DIAGNOSIS — G4730 Sleep apnea, unspecified: Secondary | ICD-10-CM | POA: Diagnosis present

## 2021-10-12 DIAGNOSIS — M1990 Unspecified osteoarthritis, unspecified site: Secondary | ICD-10-CM | POA: Diagnosis present

## 2021-10-12 DIAGNOSIS — I129 Hypertensive chronic kidney disease with stage 1 through stage 4 chronic kidney disease, or unspecified chronic kidney disease: Secondary | ICD-10-CM | POA: Diagnosis present

## 2021-10-12 DIAGNOSIS — Z9049 Acquired absence of other specified parts of digestive tract: Secondary | ICD-10-CM | POA: Diagnosis not present

## 2021-10-12 DIAGNOSIS — Z955 Presence of coronary angioplasty implant and graft: Secondary | ICD-10-CM | POA: Diagnosis not present

## 2021-10-12 LAB — URINE CULTURE: Culture: >=100000 — AB

## 2021-10-12 LAB — APTT: aPTT: 92 seconds — ABNORMAL HIGH (ref 24–36)

## 2021-10-12 LAB — GLUCOSE, CAPILLARY
Glucose-Capillary: 141 mg/dL — ABNORMAL HIGH (ref 70–99)
Glucose-Capillary: 178 mg/dL — ABNORMAL HIGH (ref 70–99)
Glucose-Capillary: 171 mg/dL — ABNORMAL HIGH (ref 70–99)
Glucose-Capillary: 206 mg/dL — ABNORMAL HIGH (ref 70–99)
Glucose-Capillary: 178 mg/dL — ABNORMAL HIGH (ref 70–99)

## 2021-10-12 LAB — HEPARIN LEVEL (UNFRACTIONATED): Heparin Unfractionated: >1.1 IU/mL — ABNORMAL HIGH (ref 0.30–0.70)

## 2021-10-12 MED ORDER — AMOXICILLIN 500 MG PO CAPS
500.0000 mg | ORAL_CAPSULE | Freq: Three times a day (TID) | ORAL | Status: DC
Start: 2021-10-12 — End: 2021-10-13
  Administered 2021-10-12 – 2021-10-13 (×4): 500 mg via ORAL
  Filled 2021-10-12 (×5): qty 1

## 2021-10-12 MED ORDER — APIXABAN 5 MG PO TABS
5.0000 mg | ORAL_TABLET | Freq: Two times a day (BID) | ORAL | Status: DC
  Filled 2021-10-12: qty 1

## 2021-10-12 MED ORDER — MIRABEGRON ER 50 MG PO TB24
50.0000 mg | ORAL_TABLET | Freq: Every day | ORAL | Status: DC
  Administered 2021-10-12 – 2021-10-13 (×2): 50 mg via ORAL
  Filled 2021-10-12 (×2): qty 1

## 2021-10-12 MED ORDER — ROSUVASTATIN CALCIUM 10 MG PO TABS
40.0000 mg | ORAL_TABLET | Freq: Every day | ORAL | Status: DC
Start: 2021-10-12 — End: 2021-10-13
  Administered 2021-10-12: 40 mg via ORAL
  Filled 2021-10-12: qty 4
  Filled 2021-10-12: qty 2

## 2021-10-12 MED ORDER — NORTRIPTYLINE HCL 10 MG PO CAPS
10.0000 mg | ORAL_CAPSULE | Freq: Two times a day (BID) | ORAL | Status: DC
  Administered 2021-10-12 – 2021-10-13 (×2): 10 mg via ORAL
  Filled 2021-10-12 (×4): qty 1

## 2021-10-12 MED ORDER — OXYBUTYNIN CHLORIDE 5 MG PO TABS
2.5000 mg | ORAL_TABLET | Freq: Every day | ORAL | Status: DC
  Administered 2021-10-12 – 2021-10-13 (×2): 2.5 mg via ORAL
  Filled 2021-10-12 (×3): qty 0.5

## 2021-10-12 MED ORDER — CHLORHEXIDINE GLUCONATE CLOTH 2 % EX PADS
6.0000 | MEDICATED_PAD | Freq: Every day | CUTANEOUS | Status: DC
Start: 2021-10-13 — End: 2021-10-13
  Administered 2021-10-13: 6 via TOPICAL

## 2021-10-12 MED ORDER — MOMETASONE FURO-FORMOTEROL FUM 100-5 MCG/ACT IN AERO
2.0000 | INHALATION_SPRAY | Freq: Two times a day (BID) | RESPIRATORY_TRACT | Status: DC
  Administered 2021-10-12 – 2021-10-13 (×3): 2 via RESPIRATORY_TRACT
  Filled 2021-10-12: qty 8.8

## 2021-10-12 MED ORDER — CIPROFLOXACIN HCL 500 MG PO TABS
500.0000 mg | ORAL_TABLET | Freq: Two times a day (BID) | ORAL | Status: DC
  Administered 2021-10-12 – 2021-10-13 (×3): 500 mg via ORAL
  Filled 2021-10-12 (×3): qty 1

## 2021-10-12 MED ORDER — INSULIN ASPART 100 UNIT/ML IJ SOLN: 0.0000 [IU] | Freq: Every day | INTRAMUSCULAR | Status: DC

## 2021-10-12 MED ORDER — SODIUM CHLORIDE 0.9 % IR SOLN
3000.0000 mL | Status: DC
  Administered 2021-10-12 – 2021-10-13 (×2): 3000 mL

## 2021-10-12 MED ORDER — INSULIN ASPART 100 UNIT/ML IJ SOLN
0.0000 [IU] | Freq: Three times a day (TID) | INTRAMUSCULAR | Status: DC
  Administered 2021-10-12: 2 [IU] via SUBCUTANEOUS
  Administered 2021-10-12 (×2): 3 [IU] via SUBCUTANEOUS
  Administered 2021-10-13: 5 [IU] via SUBCUTANEOUS
  Administered 2021-10-13: 3 [IU] via SUBCUTANEOUS
  Filled 2021-10-12 (×5): qty 1

## 2021-10-12 MED ORDER — TORSEMIDE 40 MG PO TABS
80.0000 mg | ORAL_TABLET | Freq: Two times a day (BID) | ORAL | Status: DC
Start: 2021-10-12 — End: 2021-10-12

## 2021-10-12 MED ORDER — ONDANSETRON HCL 4 MG/2ML IJ SOLN: 4.0000 mg | Freq: Four times a day (QID) | INTRAMUSCULAR | Status: DC | PRN

## 2021-10-12 MED ORDER — LOSARTAN POTASSIUM 50 MG PO TABS
100.0000 mg | ORAL_TABLET | Freq: Every day | ORAL | Status: DC
  Administered 2021-10-12: 100 mg via ORAL
  Filled 2021-10-12: qty 2

## 2021-10-12 MED ORDER — ACETAMINOPHEN 325 MG PO TABS: 650.0000 mg | ORAL_TABLET | Freq: Four times a day (QID) | ORAL | Status: DC | PRN

## 2021-10-12 MED ORDER — TIOTROPIUM BROMIDE MONOHYDRATE 2.5 MCG/ACT IN AERS
2.0000 | INHALATION_SPRAY | Freq: Every day | RESPIRATORY_TRACT | Status: DC
Start: 2021-10-12 — End: 2021-10-12

## 2021-10-12 MED ORDER — UMECLIDINIUM BROMIDE 62.5 MCG/ACT IN AEPB
1.0000 | INHALATION_SPRAY | Freq: Every day | RESPIRATORY_TRACT | Status: DC
  Administered 2021-10-12 – 2021-10-13 (×2): 1 via RESPIRATORY_TRACT
  Filled 2021-10-12: qty 7

## 2021-10-12 MED ORDER — CARVEDILOL 25 MG PO TABS
25.0000 mg | ORAL_TABLET | Freq: Two times a day (BID) | ORAL | Status: DC
  Administered 2021-10-12 – 2021-10-13 (×3): 25 mg via ORAL
  Filled 2021-10-12 (×3): qty 1

## 2021-10-12 MED ORDER — BUDESON-GLYCOPYRROL-FORMOTEROL 160-9-4.8 MCG/ACT IN AERO
1.0000 | INHALATION_SPRAY | Freq: Every day | RESPIRATORY_TRACT | Status: DC
Start: 2021-10-12 — End: 2021-10-12

## 2021-10-12 MED ORDER — SODIUM CHLORIDE 0.9 % IR SOLN
3000.0000 mL | Status: DC
  Administered 2021-10-12: 3000 mL

## 2021-10-12 MED ORDER — ONDANSETRON HCL 4 MG PO TABS: 4.0000 mg | ORAL_TABLET | Freq: Four times a day (QID) | ORAL | Status: DC | PRN

## 2021-10-12 MED ORDER — CLONIDINE HCL 0.1 MG PO TABS
0.1000 mg | ORAL_TABLET | Freq: Two times a day (BID) | ORAL | Status: DC
  Administered 2021-10-12 – 2021-10-13 (×3): 0.1 mg via ORAL
  Filled 2021-10-12 (×3): qty 1

## 2021-10-12 MED ORDER — HEPARIN (PORCINE) 25000 UT/250ML-% IV SOLN
1900.0000 [IU]/h | INTRAVENOUS | Status: DC
  Administered 2021-10-12 – 2021-10-13 (×2): 1900 [IU]/h via INTRAVENOUS
  Filled 2021-10-12 (×2): qty 250

## 2021-10-12 MED ORDER — ACETAMINOPHEN 650 MG RE SUPP: 650.0000 mg | Freq: Four times a day (QID) | RECTAL | Status: DC | PRN

## 2021-10-12 NOTE — H&P (Signed)
History and Physical    Patient: Justin Griffith MWN:[id number] DOB: 04-Oct-1940 DOA: 10/11/2021 DOS: the patient was seen and examined on 10/12/2021 PCP: [facility]  Patient coming from: Home  Chief Complaint:  Chief Complaint  Patient presents with   Hematuria    HPI: Justin Griffith is a 81 y.o. male with medical history significant for Insulin-dependent type 2 diabetes, BPH with LUTS  and awaiting HoLEP, COPD, HTN, recently hospitalized from 6/30 to 7/2 with an acute PE now on Eliquis thus delaying his HoLEP procedure, who presents to the ED with suprapubic pain.  Patient had been seen a few times in the ED over the past few days due to hematuria and bladder spasms.  He followed up with urology on 7/18 and  was prescribed mirabegron for his bladder spasms.  He was also recently treated for E faecalis UTI on culture on 7/11 sensitive to Cipro.  He denies fevers or chills. ED course and data review: Vitals within normal limits Labs: WBC 11,500 with normal hemoglobin of 14.3 CMP with creatinine 1.64 , baseline 1.04 in April 2023 but improving from a high of 2.87 on 6/22.  Glucose 230.  Patient treated with CBI 2 rounds but continued to have gross hematuria.  Hospitalist consulted for admission.     Past Medical History:  Diagnosis Date   Acute urinary retention    Arthritis    Asthma 2000   Back pain    Colon polyp 2012   COPD (chronic obstructive pulmonary disease) (HCC)    Emphysema of lung (HCC)    Heart murmur    Hernia 2000   Hyperlipidemia    Hypertension    Personal history of malignant neoplasm of large intestine    Personal history of tobacco use, presenting hazards to health    Sleep apnea    Special screening for malignant neoplasms, colon    Type 2 diabetes mellitus with proteinuria ([place]) 10/11/2014   Past Surgical History:  Procedure Laterality Date   CARDIAC CATHETERIZATION Left 09/25/2015   Procedure: Left Heart Cath and Coronary Angiography;   Surgeon: Yolonda Kida, MD;  Location: [place] [facility];  Service: Cardiovascular;  Laterality: Left;   CHOLECYSTECTOMY  1970   COLON SURGERY  07-16-99   sigmoid colon resection with primary anastomosis, chemotherapy for metastatic disease   COLONOSCOPY  2001, 2012   Dr Bary Castilla, tubular adenoma of the cecum and ascending colon in 2012.   COLONOSCOPY WITH PROPOFOL N/A 02/12/2016   Procedure: COLONOSCOPY WITH PROPOFOL;  Surgeon: Robert Bellow, MD;  Location: [facility] ENDOSCOPY;  Service: Endoscopy;  Laterality: N/A;   CORONARY STENT INTERVENTION N/A 05/17/2020   Procedure: CORONARY STENT INTERVENTION;  Surgeon: Yolonda Kida, MD;  Location: [place] [facility];  Service: Cardiovascular;  Laterality: N/A;  LAD   ESOPHAGOGASTRODUODENOSCOPY (EGD) WITH PROPOFOL N/A 02/12/2016   Procedure: ESOPHAGOGASTRODUODENOSCOPY (EGD) WITH PROPOFOL;  Surgeon: Robert Bellow, MD;  Location: [facility] ENDOSCOPY;  Service: Endoscopy;  Laterality: N/A;   HERNIA REPAIR Right    right inguinal hernia repair   JOINT REPLACEMENT Bilateral    knee replacement   KNEE SURGERY Bilateral 2010   LEFT HEART CATH AND CORONARY ANGIOGRAPHY N/A 05/17/2020   Procedure: LEFT HEART CATH AND CORONARY ANGIOGRAPHY possible PCI and stent;  Surgeon: Yolonda Kida, MD;  Location: [place] [facility];  Service: Cardiovascular;  Laterality: N/A;   MYRINGOTOMY WITH TUBE PLACEMENT Bilateral 08/16/2014   Procedure: MYRINGOTOMY WITH TUBE PLACEMENT;  Surgeon:  Carloyn Manner, MD;  Location: [facility] ORS;  Service: ENT;  Laterality: Bilateral;   PERIPHERAL VASCULAR CATHETERIZATION Right 09/04/2015   Procedure: Lower Extremity Angiography;  Surgeon: Katha Cabal, MD;  Location: [place] [facility];  Service: Cardiovascular;  Laterality: Right;   PERIPHERAL VASCULAR CATHETERIZATION  09/04/2015   Procedure: Lower Extremity Intervention;  Surgeon: Katha Cabal, MD;  Location: [place] [facility];  Service: Cardiovascular;;    TONSILLECTOMY     TYMPANOSTOMY TUBE PLACEMENT     Social History:  reports that he quit smoking about 32 years ago. His smoking use included cigarettes. He has a 30.00 pack-year smoking history. He has been exposed to tobacco smoke. He has never used smokeless tobacco. He reports that he does not drink alcohol and does not use drugs.  Allergies  Allergen Reactions   Farxiga [Dapagliflozin] Rash    Causes a severe rash in groin area    Dulaglutide Diarrhea and Other (See Comments)   Liraglutide Diarrhea and Other (See Comments)   Jardiance [Empagliflozin] Rash    Family History  Problem Relation Age of Onset   Diabetes Father    Esophageal cancer Mother    Alzheimer's disease Paternal Uncle     Prior to Admission medications   Medication Sig Start Date End Date Taking? Authorizing Provider  apixaban (ELIQUIS) 5 MG TABS tablet Take 1 tablet (5 mg total) by mouth 2 (two) times daily. 10/14/21   Mecum, Erin E, PA-C  APIXABAN (ELIQUIS) VTE STARTER PACK ('10MG'$  AND '5MG'$ ) Take as directed on package: start with two-'5mg'$  tablets twice daily for 7 days. On day 8, switch to one-'5mg'$  tablet twice daily. 09/21/21   Annita Brod, MD  aspirin EC 81 MG EC tablet Take 1 tablet (81 mg total) by mouth daily. Swallow whole. 05/22/20   Sharen Hones, MD  BREZTRI AEROSPHERE 160-9-4.8 MCG/ACT AERO Inhale 1 puff into the lungs daily. 08/19/21   [provider]  carvedilol (COREG) 25 MG tablet TAKE ONE TABLET BY MOUTH TWO TIMES A DAY FOR BLOOD PRESSURE 12/16/20   [provider]  ciprofloxacin (CIPRO) 500 MG tablet Take 1 tablet (500 mg total) by mouth 2 (two) times daily for 10 days. 10/11/21 10/21/21  Lannie Fields, PA-C  cloNIDine (CATAPRES) 0.1 MG tablet Take 0.1 mg by mouth 2 (two) times daily. 12/16/20   [provider]  fenofibrate (TRICOR) 145 MG tablet Take 1 tablet (145 mg total) by mouth daily. 09/11/21   Vigg, Avanti, MD  hydrALAZINE (APRESOLINE) 50 MG tablet TAKE ONE TABLET BY  MOUTH THREE TIMES A DAY FOR BLOOD PRESSURE 12/16/20   [provider]  insulin glargine-yfgn (SEMGLEE) 100 UNIT/ML Pen INJECT 10 UNITS UNDER SKIN ONCE EVERY DAY FOR DIABETES DISCARD PEN 28 DAYS AFTER OPENING. * GIVE AT THE SAME TIME DAILY * 08/04/21   [provider]  losartan (COZAAR) 100 MG tablet Take 100 mg by mouth daily. 09/25/20   [provider]  magnesium oxide (MAG-OX) 400 (240 Mg) MG tablet Take 1 tablet (400 mg total) by mouth daily. 06/27/21   Lorella Nimrod, MD  metFORMIN (GLUCOPHAGE) 1000 MG tablet Take 1 tablet (1,000 mg total) by mouth 2 (two) times daily with a meal. 10/16/20 10/16/21  Vigg, Avanti, MD  mirabegron ER (MYRBETRIQ) 50 MG TB24 tablet Take 1 tablet (50 mg total) by mouth daily. 10/06/21   Vaillancourt, Aldona Bar, PA-C  nitrofurantoin, macrocrystal-monohydrate, (MACROBID) 100 MG capsule Take 1 capsule (100 mg total) by mouth 2 (two) times  daily. 10/06/21   Mecum, Erin E, PA-C  nortriptyline (PAMELOR) 10 MG capsule Take 10 mg by mouth 2 (two) times daily. 09/01/21   [provider]  ondansetron (ZOFRAN-ODT) 4 MG disintegrating tablet Take 1 tablet (4 mg total) by mouth every 8 (eight) hours as needed for up to 5 days. 10/09/21 10/14/21  Lannie Fields, PA-C  Oxybutynin Chloride 2.5 MG TABS Take 1 tablet by mouth daily. 10/11/21   Lannie Fields, PA-C  oxyCODONE-acetaminophen (PERCOCET/ROXICET) 5-325 MG tablet Take 1 tablet by mouth every 6 (six) hours as needed for up to 3 days. 10/09/21 10/12/21  Lannie Fields, PA-C  rosuvastatin (CRESTOR) 40 MG tablet Take 1 tablet (40 mg total) by mouth at bedtime. 09/11/21   Vigg, Avanti, MD  Tiotropium Bromide Monohydrate (SPIRIVA RESPIMAT) 2.5 MCG/ACT AERS Inhale 2 puffs into the lungs daily. 05/15/20   Vigg, Avanti, MD  Torsemide 40 MG TABS Take 80 mg by mouth 2 (two) times daily. 04/14/21 04/14/22  [provider]  vitamin B-12 (CYANOCOBALAMIN) 500 MCG tablet Take 2,000 mcg by mouth at bedtime.    [provider]  Vitamin D, Cholecalciferol, 25 MCG (1000 UT) CAPS Take 2,000 mcg by mouth at bedtime.    [provider]    Physical Exam: Vitals:   10/12/21 0100 10/12/21 0130 10/12/21 0200 10/12/21 0230  BP: 124/71 124/66 113/68 112/73  Pulse: 80 81 83 83  Resp:  17  16  Temp:      TempSrc:      SpO2: 95% 97% 93% 91%  Weight:      Height:       Physical Exam Vitals and nursing note reviewed.  Constitutional:      General: He is not in acute distress. HENT:     Head: Normocephalic and atraumatic.  Cardiovascular:     Rate and Rhythm: Normal rate and regular rhythm.     Heart sounds: Normal heart sounds.  Pulmonary:     Effort: Pulmonary effort is normal.     Breath sounds: Normal breath sounds.  Abdominal:     Palpations: Abdomen is soft.     Tenderness: There is no abdominal tenderness.  Neurological:     Mental Status: Mental status is at baseline.     Labs on Admission: I have personally reviewed following labs and imaging studies  CBC: Recent Labs  Lab 10/09/21 1439 10/11/21 2046  WBC 11.8* 11.5*  NEUTROABS 9.4* 8.4*  HGB 14.2 14.3  HCT 42.9 44.2  MCV 89.2 90.0  PLT 289 242   Basic Metabolic Panel: Recent Labs  Lab 10/09/21 1439 10/11/21 2046  NA 133* 133*  K 4.5 4.3  CL 98 99  CO2 26 26  GLUCOSE 262* 230*  BUN 21 20  CREATININE 1.52* 1.64*  CALCIUM 9.9 9.8   GFR: Estimated Creatinine Clearance: 45.6 mL/min (A) (by C-G formula based on SCr of 1.64 mg/dL (H)). Liver Function Tests: Recent Labs  Lab 10/09/21 1439 10/11/21 2046  AST 17 17  ALT 16 14  ALKPHOS 59 53  BILITOT 0.8 0.8  PROT 7.9 7.9  ALBUMIN 3.7 3.4*   No results for input(s): "LIPASE", "AMYLASE" in the last 168 hours. No results for input(s): "AMMONIA" in the last 168 hours. Coagulation Profile: No results for input(s): "INR", "PROTIME" in the last 168 hours. Cardiac Enzymes: No results for input(s): "CKTOTAL", "CKMB", "CKMBINDEX", "TROPONINI" in the last 168  hours. BNP (last 3 results) No results for input(s): "PROBNP" in  the last 8760 hours. HbA1C: No results for input(s): "HGBA1C" in the last 72 hours. CBG: No results for input(s): "GLUCAP" in the last 168 hours. Lipid Profile: No results for input(s): "CHOL", "HDL", "LDLCALC", "TRIG", "CHOLHDL", "LDLDIRECT" in the last 72 hours. Thyroid Function Tests: No results for input(s): "TSH", "T4TOTAL", "FREET4", "T3FREE", "THYROIDAB" in the last 72 hours. Anemia Panel: No results for input(s): "VITAMINB12", "FOLATE", "FERRITIN", "TIBC", "IRON", "RETICCTPCT" in the last 72 hours. Urine analysis:    Component Value Date/Time   COLORURINE AMBER (A) 10/09/2021 1439   APPEARANCEUR TURBID (A) 10/09/2021 1439   APPEARANCEUR Cloudy (A) 09/18/2021 1421   LABSPEC 1.012 10/09/2021 1439   PHURINE 6.0 10/09/2021 1439   GLUCOSEU 150 (A) 10/09/2021 1439   HGBUR LARGE (A) 10/09/2021 1439   BILIRUBINUR NEGATIVE 10/09/2021 1439   BILIRUBINUR Negative 09/18/2021 1421   KETONESUR NEGATIVE 10/09/2021 1439   PROTEINUR 100 (A) 10/09/2021 1439   NITRITE NEGATIVE 10/09/2021 1439   LEUKOCYTESUR LARGE (A) 10/09/2021 1439    Radiological Exams on Admission: No results found.   Data Reviewed: Relevant notes from primary care and specialist visits, past discharge summaries as available in EHR, including Care Everywhere. Prior diagnostic testing as pertinent to current admission diagnoses Updated medications and problem lists for reconciliation ED course, including vitals, labs, imaging, treatment and response to treatment Triage notes, nursing and pharmacy notes and ED provider's notes Notable results as noted in HPI   Assessment and Plan: * Gross hematuria Continue CBI Urology consult  History of pulmonary embolism 09/18/21 Chronic anticoagulation on Eliquis Patient has known history of recurrent hematuria per review of urology notes Has recent PE involving multiple segments and with right heart  strain Risk benefits about warrant continuation of anticoagulation We will continue Eliquis at this time and continue to assess risk-benefit,  Continue to monitor H&H  Essential hypertension Continue losartan and carvedilol Will hold clonidine and hydralazine for now due to soft BP of 112/73  Type 2 diabetes mellitus without complications (HCC) Continue basal insulin Sliding scale coverage  Chronic obstructive pulmonary disease (COPD) ([place]) Continue home inhalers DuoNebs as needed  BPH with obstruction/lower urinary tract symptoms Foley dependent Followed by urology History of recurrent gross hematuria Currently awaiting HoLEP which is on hold due to anticoagulation for PE  Acute renal failure (HCC) creatinine 1.64 , baseline 1.04 in April 2023 but improving from a high of 2.87 on 6/22 Likely obstructive related to BPH Will defer to urology        DVT prophylaxis: On Eliquis  Consults: Urologist, Dr. Bernardo Heater  Advance Care Planning:   Code Status: Prior   Family Communication: Wife at bedside  Disposition Plan: Back to previous home environment  Severity of Illness: The appropriate patient status for this patient is OBSERVATION. Observation status is judged to be reasonable and necessary in order to provide the required intensity of service to ensure the patient's safety. The patient's presenting symptoms, physical exam findings, and initial radiographic and laboratory data in the context of their medical condition is felt to place them at decreased risk for further clinical deterioration. Furthermore, it is anticipated that the patient will be medically stable for discharge from the hospital within 2 midnights of admission.   Author: Athena Masse, MD 10/12/2021 3:50 AM  For on call review [email or web address].

## 2021-10-12 NOTE — Progress Notes (Signed)
ANTICOAGULATION CONSULT NOTE - Initial Consult  Pharmacy Consult for apixaban Indication: pulmonary embolus   continuation of PTA apixaban  Allergies  Allergen Reactions   Farxiga [Dapagliflozin] Rash    Causes a severe rash in groin area    Dulaglutide Diarrhea and Other (See Comments)   Liraglutide Diarrhea and Other (See Comments)   Jardiance [Empagliflozin] Rash    Patient Measurements: Height: '5\' 11"'$  (180.3 cm) Weight: 115 kg (253 lb 8.5 oz) IBW/kg (Calculated) : 75.3 Heparin Dosing Weight:    Vital Signs: Temp: 98 F (36.7 C) (07/22 2012) Temp Source: Oral (07/22 2012) BP: 105/69 (07/23 0400) Pulse Rate: 74 (07/23 0400)  Labs: Recent Labs    10/09/21 1439 10/11/21 2046  HGB 14.2 14.3  HCT 42.9 44.2  PLT 289 299  CREATININE 1.52* 1.64*    Estimated Creatinine Clearance: 45.6 mL/min (A) (by C-G formula based on SCr of 1.64 mg/dL (H)).   Medical History: Past Medical History:  Diagnosis Date   Acute urinary retention    Arthritis    Asthma 2000   Back pain    Colon polyp 2012   COPD (chronic obstructive pulmonary disease) (HCC)    Emphysema of lung (HCC)    Heart murmur    Hernia 2000   Hyperlipidemia    Hypertension    Personal history of malignant neoplasm of large intestine    Personal history of tobacco use, presenting hazards to health    Sleep apnea    Special screening for malignant neoplasms, colon    Type 2 diabetes mellitus with proteinuria ([place]) 10/11/2014    Medications:  (Not in a hospital admission)  Scheduled:   apixaban  5 mg Oral BID   Budeson-Glycopyrrol-Formoterol  1 puff Inhalation Daily   carvedilol  25 mg Oral BID WC   insulin aspart  0-15 Units Subcutaneous TID WC   insulin aspart  0-5 Units Subcutaneous QHS   lidocaine  1 Application Urethral Once   losartan  100 mg Oral Daily   mirabegron ER  50 mg Oral Daily   ondansetron (ZOFRAN) IV  4 mg Intravenous Once   rosuvastatin  40 mg Oral QHS   Infusions:   sodium  chloride irrigation     sodium chloride irrigation      Assessment: 81 yo male to resume apixaban. Admitted for foley cath issues, hematuria -pt started on apixaban (dosing for PE )on 09/20/21 during previous hospital admission -currently taking apixaban 5 mg BID Hgb 14.3  plt 299    Goal of Therapy:  Monitor platelets by anticoagulation protocol: Yes   Plan:  Will resume apixaban 5 mg po BID for continuation of PE treatment. F/u CBC/SCr per protocol  Trentan Trippe A 10/12/2021,4:39 AM

## 2021-10-12 NOTE — Assessment & Plan Note (Signed)
Continue basal insulin Sliding scale coverage

## 2021-10-12 NOTE — Consult Note (Signed)
Urology Consult  Requesting physician: Judd Gaudier, MD  Reason for consultation: Gross hematuria on CBI   History of Present Illness: Justin Griffith is a 81 y.o. male followed by Dr. Diamantina Providence for urinary retention, gross hematuria and recurrent UTIs with multiple ED visits for gross hematuria and Foley catheter placement.  Awaiting HoLEP though hospitalized 09/19/2021 with pulmonary emboli and started on Eliquis.  Presented to the ED last night complaining of suprapubic pain and a non-draining Foley catheter which would not irrigate.  His Foley was exchanged for a 20 F three-way Foley catheter.  Several clots were irrigated from the bladder and he was placed on CBI and subsequently admitted to the hospitalist service   Past Medical History:  Diagnosis Date   Acute urinary retention    Arthritis    Asthma 2000   Back pain    Colon polyp 2012   COPD (chronic obstructive pulmonary disease) (HCC)    Emphysema of lung (HCC)    Heart murmur    Hernia 2000   Hyperlipidemia    Hypertension    Personal history of malignant neoplasm of large intestine    Personal history of tobacco use, presenting hazards to health    Sleep apnea    Special screening for malignant neoplasms, colon    Type 2 diabetes mellitus with proteinuria ([place]) 10/11/2014    Past Surgical History:  Procedure Laterality Date   CARDIAC CATHETERIZATION Left 09/25/2015   Procedure: Left Heart Cath and Coronary Angiography;  Surgeon: Yolonda Kida, MD;  Location: [place] [facility];  Service: Cardiovascular;  Laterality: Left;   CHOLECYSTECTOMY  1970   COLON SURGERY  07-16-99   sigmoid colon resection with primary anastomosis, chemotherapy for metastatic disease   COLONOSCOPY  2001, 2012   Dr Bary Castilla, tubular adenoma of the cecum and ascending colon in 2012.   COLONOSCOPY WITH PROPOFOL N/A 02/12/2016   Procedure: COLONOSCOPY WITH PROPOFOL;  Surgeon: Robert Bellow, MD;  Location: [facility] ENDOSCOPY;  Service:  Endoscopy;  Laterality: N/A;   CORONARY STENT INTERVENTION N/A 05/17/2020   Procedure: CORONARY STENT INTERVENTION;  Surgeon: Yolonda Kida, MD;  Location: [place] [facility];  Service: Cardiovascular;  Laterality: N/A;  LAD   ESOPHAGOGASTRODUODENOSCOPY (EGD) WITH PROPOFOL N/A 02/12/2016   Procedure: ESOPHAGOGASTRODUODENOSCOPY (EGD) WITH PROPOFOL;  Surgeon: Robert Bellow, MD;  Location: [facility] ENDOSCOPY;  Service: Endoscopy;  Laterality: N/A;   HERNIA REPAIR Right    right inguinal hernia repair   JOINT REPLACEMENT Bilateral    knee replacement   KNEE SURGERY Bilateral 2010   LEFT HEART CATH AND CORONARY ANGIOGRAPHY N/A 05/17/2020   Procedure: LEFT HEART CATH AND CORONARY ANGIOGRAPHY possible PCI and stent;  Surgeon: Yolonda Kida, MD;  Location: [place] [facility];  Service: Cardiovascular;  Laterality: N/A;   MYRINGOTOMY WITH TUBE PLACEMENT Bilateral 08/16/2014   Procedure: MYRINGOTOMY WITH TUBE PLACEMENT;  Surgeon: Carloyn Manner, MD;  Location: [facility]S;  Service: ENT;  Laterality: Bilateral;   PERIPHERAL VASCULAR CATHETERIZATION Right 09/04/2015   Procedure: Lower Extremity Angiography;  Surgeon: Katha Cabal, MD;  Location: [place] [facility];  Service: Cardiovascular;  Laterality: Right;   PERIPHERAL VASCULAR CATHETERIZATION  09/04/2015   Procedure: Lower Extremity Intervention;  Surgeon: Katha Cabal, MD;  Location: [place] [facility];  Service: Cardiovascular;;   TONSILLECTOMY     TYMPANOSTOMY TUBE PLACEMENT      Home Medications:  Current Meds  Medication Sig   [START ON 10/14/2021] apixaban (ELIQUIS)  5 MG TABS tablet Take 1 tablet (5 mg total) by mouth 2 (two) times daily.   BREZTRI AEROSPHERE 160-9-4.8 MCG/ACT AERO Inhale 1 puff into the lungs daily.   cloNIDine (CATAPRES) 0.1 MG tablet Take 0.1 mg by mouth 2 (two) times daily.   fenofibrate (TRICOR) 145 MG tablet Take 1 tablet (145 mg total) by mouth daily.   hydrALAZINE (APRESOLINE) 50 MG tablet  TAKE ONE TABLET BY MOUTH THREE TIMES A DAY FOR BLOOD PRESSURE   losartan (COZAAR) 100 MG tablet Take 100 mg by mouth daily.   magnesium oxide (MAG-OX) 400 (240 Mg) MG tablet Take 1 tablet (400 mg total) by mouth daily.   metFORMIN (GLUCOPHAGE) 1000 MG tablet Take 1 tablet (1,000 mg total) by mouth 2 (two) times daily with a meal.   mirabegron ER (MYRBETRIQ) 50 MG TB24 tablet Take 1 tablet (50 mg total) by mouth daily.   nitrofurantoin, macrocrystal-monohydrate, (MACROBID) 100 MG capsule Take 1 capsule (100 mg total) by mouth 2 (two) times daily.   nortriptyline (PAMELOR) 10 MG capsule Take 10 mg by mouth 2 (two) times daily.   Oxybutynin Chloride 2.5 MG TABS Take 1 tablet by mouth daily.   oxyCODONE-acetaminophen (PERCOCET/ROXICET) 5-325 MG tablet Take 1 tablet by mouth every 6 (six) hours as needed for up to 3 days.   Torsemide 40 MG TABS Take 80 mg by mouth 2 (two) times daily.   vitamin B-12 (CYANOCOBALAMIN) 500 MCG tablet Take 2,000 mcg by mouth at bedtime.   Vitamin D, Cholecalciferol, 25 MCG (1000 UT) CAPS Take 2,000 mcg by mouth at bedtime.    Allergies:  Allergies  Allergen Reactions   Farxiga [Dapagliflozin] Rash    Causes a severe rash in groin area    Dulaglutide Diarrhea and Other (See Comments)   Liraglutide Diarrhea and Other (See Comments)   Jardiance [Empagliflozin] Rash    Family History  Problem Relation Age of Onset   Diabetes Father    Esophageal cancer Mother    Alzheimer's disease Paternal Uncle     Social History:  reports that he quit smoking about 32 years ago. His smoking use included cigarettes. He has a 30.00 pack-year smoking history. He has been exposed to tobacco smoke. He has never used smokeless tobacco. He reports that he does not drink alcohol and does not use drugs.  ROS: A complete review of systems was performed.  All systems are negative except for pertinent findings as noted.  Physical Exam:  Vital signs in last 24 hours: Temp:  [97.7 F  (36.5 C)-98.4 F (36.9 C)] 97.7 F (36.5 C) (07/23 0753) Pulse Rate:  [69-88] 69 (07/23 0753) Resp:  [15-21] 18 (07/23 0753) BP: (105-214)/(63-104) 163/87 (07/23 0753) SpO2:  [91 %-97 %] 96 % (07/23 0753) Weight:  [259 kg] 115 kg (07/22 2024) Constitutional: The patient was sleeping soundly GU: CBI effluent was crystal-clear on low-flow   Laboratory Data:  Recent Labs    10/09/21 1439 10/11/21 2046  WBC 11.8* 11.5*  HGB 14.2 14.3  HCT 42.9 44.2   Recent Labs    10/09/21 1439 10/11/21 2046  NA 133* 133*  K 4.5 4.3  CL 98 99  CO2 26 26  GLUCOSE 262* 230*  BUN 21 20  CREATININE 1.52* 1.64*  CALCIUM 9.9 9.8   No results for input(s): "LABPT", "INR" in the last 72 hours. No results for input(s): "LABURIN" in the last 72 hours. Results for orders placed or performed during the hospital encounter of 10/09/21  Urine Culture     Status: Abnormal   Collection Time: 10/09/21  2:43 PM   Specimen: Urine, Random  Result Value Ref Range Status   Specimen Description   Final    URINE, RANDOM Performed at [facility], [address] [place], [place] [address]    Special Requests   Final    NONE Performed at [facility], [place], [place] [address]    Culture (A)  Final    >=100,000 COLONIES/mL PSEUDOMONAS AERUGINOSA >=100,000 COLONIES/mL ENTEROCOCCUS FAECALIS    Report Status 10/12/2021 FINAL  Final   Organism ID, Bacteria PSEUDOMONAS AERUGINOSA (A)  Final   Organism ID, Bacteria ENTEROCOCCUS FAECALIS (A)  Final      Susceptibility   Enterococcus faecalis - MIC*    AMPICILLIN <=2 SENSITIVE Sensitive     NITROFURANTOIN <=16 SENSITIVE Sensitive     VANCOMYCIN 1 SENSITIVE Sensitive     * >=100,000 COLONIES/mL ENTEROCOCCUS FAECALIS   Pseudomonas aeruginosa - MIC*    CEFTAZIDIME 2 SENSITIVE Sensitive     CIPROFLOXACIN 0.5 SENSITIVE Sensitive     GENTAMICIN <=1 SENSITIVE Sensitive     IMIPENEM 2 SENSITIVE Sensitive     PIP/TAZO 8  SENSITIVE Sensitive     CEFEPIME 2 SENSITIVE Sensitive     * >=100,000 COLONIES/mL PSEUDOMONAS AERUGINOSA     Impression/Assessment:   1.  BPH with urinary retention Recurrent UTI and gross hematuria Most likely exacerbated secondary to Eliquis CBI effluent presently clear on low flow Awaiting clearance for HoLEP  Recommendation:  Continue CBI today Okay for IV heparin   10/12/2021, 12:58 PM  John Giovanni,  MD

## 2021-10-12 NOTE — Assessment & Plan Note (Signed)
Continue CBI Urology consult

## 2021-10-12 NOTE — Assessment & Plan Note (Signed)
Continue losartan and carvedilol Will hold clonidine and hydralazine for now due to soft BP of 112/73

## 2021-10-12 NOTE — ED Provider Notes (Signed)
Patient resting comfortably, no ongoing pain or dysuria.  However, after bladder irrigation with 3 L he continues to have gross hematuria.  Given his presentation, ongoing gross hematuria I discussed with the patient I think would be reasonable for him to be admitted for urology consultation and further treatment and some additional bladder irrigation at this time given his high risk of clot formation.  Patient agreeable.  Consulted and case discussed with Dr. Damita Dunnings Of the hospitalist service will admit   Delman Kitten, MD 10/12/21 [phone]

## 2021-10-12 NOTE — Assessment & Plan Note (Signed)
Chronic anticoagulation on Eliquis Patient has known history of recurrent hematuria per review of urology notes Has recent PE involving multiple segments and with right heart strain Risk benefits about warrant continuation of anticoagulation We will continue Eliquis at this time and continue to assess risk-benefit,  Continue to monitor H&H

## 2021-10-12 NOTE — Progress Notes (Signed)
[place] for heparin infusion initiation & monitoring Indication: h/o pulmonary embolus  Allergies  Allergen Reactions   Farxiga [Dapagliflozin] Rash    Causes a severe rash in groin area    Dulaglutide Diarrhea and Other (See Comments)   Liraglutide Diarrhea and Other (See Comments)   Jardiance [Empagliflozin] Rash    Patient Measurements: Height: '5\' 11"'$  (180.3 cm) Weight: 115 kg (253 lb 8.5 oz) IBW/kg (Calculated) : 75.3 Heparin Dosing Weight: 100.4 kg  Vital Signs: Temp: 97.7 F (36.5 C) (07/23 2004) BP: 136/68 (07/23 2004) Pulse Rate: 56 (07/23 2004)  Labs: Recent Labs    10/11/21 2046 10/12/21 2302  HGB 14.3  --   HCT 44.2  --   PLT 299  --   APTT  --  92*  HEPARINUNFRC  --  >1.10*  CREATININE 1.64*  --      Estimated Creatinine Clearance: 45.6 mL/min (A) (by C-G formula based on SCr of 1.64 mg/dL (H)).   Medical History: Past Medical History:  Diagnosis Date   Acute urinary retention    Arthritis    Asthma 2000   Back pain    Colon polyp 2012   COPD (chronic obstructive pulmonary disease) (HCC)    Emphysema of lung (HCC)    Heart murmur    Hernia 2000   Hyperlipidemia    Hypertension    Personal history of malignant neoplasm of large intestine    Personal history of tobacco use, presenting hazards to health    Sleep apnea    Special screening for malignant neoplasms, colon    Type 2 diabetes mellitus with proteinuria ([place]) 10/11/2014    Medications:  Scheduled:   amoxicillin  500 mg Oral Q8H   carvedilol  25 mg Oral BID WC   [START ON 10/13/2021] Chlorhexidine Gluconate Cloth  6 each Topical Daily   ciprofloxacin  500 mg Oral BID   cloNIDine  0.1 mg Oral BID   insulin aspart  0-15 Units Subcutaneous TID WC   insulin aspart  0-5 Units Subcutaneous QHS   lidocaine  1 Application Urethral Once   mirabegron ER  50 mg Oral Daily   mometasone-formoterol  2 puff Inhalation BID   And   umeclidinium  bromide  1 puff Inhalation Daily   nortriptyline  10 mg Oral BID   ondansetron (ZOFRAN) IV  4 mg Intravenous Once   oxybutynin  2.5 mg Oral Daily   rosuvastatin  40 mg Oral QHS    Assessment: 81 y.o. male w/ PMH of BPS w/ LUTS, COPD, HTN recently hospitalized from 6/30 to 7/2 with an acute PE on apixaban which was stopped due to hematuria. Last dose prior to admission  7/23 2302 aPTT=92, HL >1.10     therapeutic, not correlating cont 1900 units/hr   Goal of Therapy:  Heparin level 0.3-0.7 units/ml aPTT 66-102 seconds Monitor platelets by anticoagulation protocol: Yes   Plan:  7/23 2302 aPTT=92, HL >1.10     therapeutic, not correlating  Continue heparin infusion at 1900 units/hr (no bolus per MD request): this rate is based upon data from the previous admission  Check confirmatory aPTT in 8 hours and daily while on heparin F/u aPTT until it correlates with HL  (check HL once daily to assess) Continue to monitor H&H and platelets, CBC daily  Keiasha Diep A 10/12/2021,11:54 PM

## 2021-10-12 NOTE — Assessment & Plan Note (Signed)
Continue home inhalers DuoNebs as needed

## 2021-10-12 NOTE — Assessment & Plan Note (Signed)
creatinine 1.64 , baseline 1.04 in April 2023 but improving from a high of 2.87 on 6/22 Likely obstructive related to BPH Will defer to urology

## 2021-10-12 NOTE — Progress Notes (Signed)
[place] attempted to complete AD Info for patient via spiritual consult. Completed education and shared with patient that [facility] will attempt to complete document if a notary was found. Will relay to next chaplain on 7/24. Spent several minutes in life review with patient. Is a veteran. Agar.

## 2021-10-12 NOTE — Progress Notes (Addendum)
PROGRESS NOTE    Justin Griffith  WGN:[id number] DOB: March 14, 1941 DOA: 10/11/2021 PCP: [name]  Outpatient Specialists: urology    Brief Narrative:   From admission h and p Justin Griffith is a 81 y.o. male with medical history significant for Insulin-dependent type 2 diabetes, BPH with LUTS  and awaiting HoLEP, COPD, HTN, recently hospitalized from 6/30 to 7/2 with an acute PE now on Eliquis thus delaying his HoLEP procedure, who presents to the ED with suprapubic pain.  Patient had been seen a few times in the ED over the past few days due to hematuria and bladder spasms.  He followed up with urology on 7/18 and  was prescribed mirabegron for his bladder spasms.  He was also recently treated for E faecalis UTI on culture on 7/11 sensitive to Cipro.  He denies fevers or chills.   Assessment & Plan:   Principal Problem:   Gross hematuria Active Problems:   History of pulmonary embolism 09/18/21   Chronic anticoagulation   Essential hypertension   Type 2 diabetes mellitus without complications (HCC)   Chronic obstructive pulmonary disease (COPD) (HCC)   BPH with obstruction/lower urinary tract symptoms Foley dependent   Morbid obesity (HCC)   PAD (peripheral artery disease) (HCC)   Coronary artery disease   History of CVA (cerebrovascular accident)   Acute renal failure (HCC)   Stage 3b chronic kidney disease (CKD) ([place])   # Gross hematuria # BPH # Chronic indwelling foley. Has chronic indwelling foley which was exchanged in ED on 7/22 after presenting with suprapubic pressure, foley not draining well, and gross hematuria that began 7/20. Hgb wnl. Has had foley for several months, eventual plan for HOLEP but that's now on hold given new PE - CBI - urology to see - hold anticoagulation pending urology eval  # Hx PE Diagnosed 09/18/21, treated with eliquis. Respiratory status stable, no O2 requirement - anticoagulation paused as above  # Acute  cystitis Suprapubic pain and burning, seen in ED on 7/20, culture growing pan-sensitive pseudomonas and now also e faecalis - continue cipro, add oral amox  # HTN Here hypertensive - cont home clonidine, losartan, coreg  # HFpEF Mild LE edema, breathing comfortably - cont home coreg, torsemide  # T2DM Here glucose appropriate - SSI, add basal if needed  # CAD Hx CABG. Asymptomatic - home statin - asa on hold  # AKI? # CKD stage 3 b Baseline cr was close to 1 earlier this year. Worsened to 2s recent hospitalization. Here cr 1.64. possible obstructive uropathy. Foley replaced in ED yesterday - hold home torsemide and losartan for the moment - monitor   DVT prophylaxis: SCDs Code Status: full Family Communication: daughter updated telephonically 7/23  Level of care: Med-Surg Status is: inpt    Consultants:  urology  Procedures: none  Antimicrobials:  ciprofloxacin    Subjective: Mild suprapubic discomfort  Objective: Vitals:   10/12/21 0330 10/12/21 0400 10/12/21 0604 10/12/21 0753  BP: 113/66 105/69 131/74 (!) 163/87  Pulse: 78 74 72 69  Resp:  '15 16 18  '$ Temp:   98.1 F (36.7 C) 97.7 F (36.5 C)  TempSrc:      SpO2: 95% 95% 97% 96%  Weight:      Height:        Intake/Output Summary (Last 24 hours) at 10/12/2021 0955 Last data filed at 10/12/2021 0933 Gross per 24 hour  Intake 3000 ml  Output 5800 ml  Net -2800 ml  Filed Weights   10/11/21 2024  Weight: 115 kg    Examination:  General exam: Appears calm and comfortable  Respiratory system: Clear to auscultation. Respiratory effort normal. Cardiovascular system: S1 & S2 heard, RRR. Soft systolic murmur Gastrointestinal system: Abdomen is nondistended, soft and with mild tenderness suprapubically. No organomegaly or masses felt. Normal bowel sounds heard. Central nervous system: Alert and oriented. No focal neurological deficits. Hard of hearing Extremities: Symmetric 5 x 5 power. Skin:  edema LEs Psychiatry: Judgement and insight appear normal. Mood & affect appropriate.     Data Reviewed: I have personally reviewed following labs and imaging studies  CBC: Recent Labs  Lab 10/09/21 1439 10/11/21 2046  WBC 11.8* 11.5*  NEUTROABS 9.4* 8.4*  HGB 14.2 14.3  HCT 42.9 44.2  MCV 89.2 90.0  PLT 289 287   Basic Metabolic Panel: Recent Labs  Lab 10/09/21 1439 10/11/21 2046  NA 133* 133*  K 4.5 4.3  CL 98 99  CO2 26 26  GLUCOSE 262* 230*  BUN 21 20  CREATININE 1.52* 1.64*  CALCIUM 9.9 9.8   GFR: Estimated Creatinine Clearance: 45.6 mL/min (A) (by C-G formula based on SCr of 1.64 mg/dL (H)). Liver Function Tests: Recent Labs  Lab 10/09/21 1439 10/11/21 2046  AST 17 17  ALT 16 14  ALKPHOS 59 53  BILITOT 0.8 0.8  PROT 7.9 7.9  ALBUMIN 3.7 3.4*   No results for input(s): "LIPASE", "AMYLASE" in the last 168 hours. No results for input(s): "AMMONIA" in the last 168 hours. Coagulation Profile: No results for input(s): "INR", "PROTIME" in the last 168 hours. Cardiac Enzymes: No results for input(s): "CKTOTAL", "CKMB", "CKMBINDEX", "TROPONINI" in the last 168 hours. BNP (last 3 results) No results for input(s): "PROBNP" in the last 8760 hours. HbA1C: No results for input(s): "HGBA1C" in the last 72 hours. CBG: Recent Labs  Lab 10/12/21 0837  GLUCAP 141*   Lipid Profile: No results for input(s): "CHOL", "HDL", "LDLCALC", "TRIG", "CHOLHDL", "LDLDIRECT" in the last 72 hours. Thyroid Function Tests: No results for input(s): "TSH", "T4TOTAL", "FREET4", "T3FREE", "THYROIDAB" in the last 72 hours. Anemia Panel: No results for input(s): "VITAMINB12", "FOLATE", "FERRITIN", "TIBC", "IRON", "RETICCTPCT" in the last 72 hours. Urine analysis:    Component Value Date/Time   COLORURINE AMBER (A) 10/09/2021 1439   APPEARANCEUR TURBID (A) 10/09/2021 1439   APPEARANCEUR Cloudy (A) 09/18/2021 1421   LABSPEC 1.012 10/09/2021 1439   PHURINE 6.0 10/09/2021 1439    GLUCOSEU 150 (A) 10/09/2021 1439   HGBUR LARGE (A) 10/09/2021 1439   BILIRUBINUR NEGATIVE 10/09/2021 1439   BILIRUBINUR Negative 09/18/2021 1421   KETONESUR NEGATIVE 10/09/2021 1439   PROTEINUR 100 (A) 10/09/2021 1439   NITRITE NEGATIVE 10/09/2021 1439   LEUKOCYTESUR LARGE (A) 10/09/2021 1439   Sepsis Labs: '@LABRCNTIP'$ (procalcitonin:4,lacticidven:4)  ) Recent Results (from the past 240 hour(s))  Urine Culture     Status: Abnormal (Preliminary result)   Collection Time: 10/09/21  2:43 PM   Specimen: Urine, Random  Result Value Ref Range Status   Specimen Description   Final    URINE, RANDOM Performed at [facility], [address], [place], [place] [address]    Special Requests   Final    NONE Performed at [facility], [address] [place], [place] [address]    Culture (A)  Final    >=100,000 COLONIES/mL PSEUDOMONAS AERUGINOSA >=100,000 COLONIES/mL ENTEROCOCCUS FAECALIS SUSCEPTIBILITIES TO FOLLOW Performed at [facility], [place] [address], [place], [place] [address]  Report Status PENDING  Incomplete   Organism ID, Bacteria PSEUDOMONAS AERUGINOSA (A)  Final      Susceptibility   Pseudomonas aeruginosa - MIC*    CEFTAZIDIME 2 SENSITIVE Sensitive     CIPROFLOXACIN 0.5 SENSITIVE Sensitive     GENTAMICIN <=1 SENSITIVE Sensitive     IMIPENEM 2 SENSITIVE Sensitive     PIP/TAZO 8 SENSITIVE Sensitive     CEFEPIME 2 SENSITIVE Sensitive     * >=100,000 COLONIES/mL PSEUDOMONAS AERUGINOSA         Radiology Studies: No results found.      Scheduled Meds:  carvedilol  25 mg Oral BID WC   insulin aspart  0-15 Units Subcutaneous TID WC   insulin aspart  0-5 Units Subcutaneous QHS   lidocaine  1 Application Urethral Once   losartan  100 mg Oral Daily   mirabegron ER  50 mg Oral Daily   mometasone-formoterol  2 puff Inhalation BID   And   umeclidinium bromide  1 puff Inhalation Daily   ondansetron (ZOFRAN) IV  4 mg Intravenous Once    rosuvastatin  40 mg Oral QHS   Continuous Infusions:  sodium chloride irrigation     sodium chloride irrigation       LOS: 0 days     Desma Maxim, MD [facility]   If 7PM-7AM, please contact night-coverage [email or web address] Password [facility] 10/12/2021, 9:55 AM

## 2021-10-12 NOTE — Assessment & Plan Note (Signed)
Followed by urology History of recurrent gross hematuria Currently awaiting HoLEP which is on hold due to anticoagulation for PE

## 2021-10-12 NOTE — Progress Notes (Signed)
[place] for heparin infusion initiation & monitoring Indication: h/o pulmonary embolus  Allergies  Allergen Reactions   Farxiga [Dapagliflozin] Rash    Causes a severe rash in groin area    Dulaglutide Diarrhea and Other (See Comments)   Liraglutide Diarrhea and Other (See Comments)   Jardiance [Empagliflozin] Rash    Patient Measurements: Height: '5\' 11"'$  (180.3 cm) Weight: 115 kg (253 lb 8.5 oz) IBW/kg (Calculated) : 75.3 Heparin Dosing Weight: 100.4 kg  Vital Signs: Temp: 97.7 F (36.5 C) (07/23 0753) BP: 163/87 (07/23 0753) Pulse Rate: 69 (07/23 0753)  Labs: Recent Labs    10/09/21 1439 10/11/21 2046  HGB 14.2 14.3  HCT 42.9 44.2  PLT 289 299  CREATININE 1.52* 1.64*    Estimated Creatinine Clearance: 45.6 mL/min (A) (by C-G formula based on SCr of 1.64 mg/dL (H)).   Medical History: Past Medical History:  Diagnosis Date   Acute urinary retention    Arthritis    Asthma 2000   Back pain    Colon polyp 2012   COPD (chronic obstructive pulmonary disease) (HCC)    Emphysema of lung (HCC)    Heart murmur    Hernia 2000   Hyperlipidemia    Hypertension    Personal history of malignant neoplasm of large intestine    Personal history of tobacco use, presenting hazards to health    Sleep apnea    Special screening for malignant neoplasms, colon    Type 2 diabetes mellitus with proteinuria ([place]) 10/11/2014    Medications:  Scheduled:   carvedilol  25 mg Oral BID WC   ciprofloxacin  500 mg Oral BID   cloNIDine  0.1 mg Oral BID   insulin aspart  0-15 Units Subcutaneous TID WC   insulin aspart  0-5 Units Subcutaneous QHS   lidocaine  1 Application Urethral Once   mirabegron ER  50 mg Oral Daily   mometasone-formoterol  2 puff Inhalation BID   And   umeclidinium bromide  1 puff Inhalation Daily   nortriptyline  10 mg Oral BID   ondansetron (ZOFRAN) IV  4 mg Intravenous Once   Oxybutynin Chloride  1 tablet Oral Daily    rosuvastatin  40 mg Oral QHS   Tiotropium Bromide Monohydrate  2 puff Inhalation Daily    Assessment: 81 y.o. male w/ PMH of BPS w/ LUTS, COPD, HTN recently hospitalized from 6/30 to 7/2 with an acute PE on apixaban which was stopped due to hematuria. Last dose prior to admission  Goal of Therapy:  Heparin level 0.3-0.7 units/ml aPTT 66-102 seconds Monitor platelets by anticoagulation protocol: Yes   Plan:  Start heparin infusion at 1900 units/hr (no bolus per MD request): this rate is based upon data from the previous admission  Check anti-Xa level in 8 hours and daily while on heparin Continue to monitor H&H and platelets  Dallie Piles 10/12/2021,12:37 PM

## 2021-10-13 ENCOUNTER — Telehealth: Payer: Self-pay | Admitting: Physician Assistant

## 2021-10-13 ENCOUNTER — Ambulatory Visit: Payer: HMO | Admitting: Physician Assistant

## 2021-10-13 DIAGNOSIS — R310 Gross hematuria: Secondary | ICD-10-CM | POA: Diagnosis not present

## 2021-10-13 LAB — BASIC METABOLIC PANEL
Anion gap: 5 (ref 5–15)
BUN: 29 mg/dL — ABNORMAL HIGH (ref 8–23)
CO2: 29 mmol/L (ref 22–32)
Calcium: 9.2 mg/dL (ref 8.9–10.3)
Chloride: 101 mmol/L (ref 98–111)
Creatinine, Ser: 1.85 mg/dL — ABNORMAL HIGH (ref 0.61–1.24)
GFR, Estimated: 36 mL/min — ABNORMAL LOW (ref >60)
Glucose, Bld: 189 mg/dL — ABNORMAL HIGH (ref 70–99)
Potassium: 4.3 mmol/L (ref 3.5–5.1)
Sodium: 135 mmol/L (ref 135–145)

## 2021-10-13 LAB — CBC
HCT: 30.3 % — ABNORMAL LOW (ref 39.0–52.0)
Hemoglobin: 10 g/dL — ABNORMAL LOW (ref 13.0–17.0)
MCH: 29.9 pg (ref 26.0–34.0)
MCHC: 33 g/dL (ref 30.0–36.0)
MCV: 90.4 fL (ref 80.0–100.0)
Platelets: 230 10*3/uL (ref 150–400)
RBC: 3.35 MIL/uL — ABNORMAL LOW (ref 4.22–5.81)
RDW: 13 % (ref 11.5–15.5)
WBC: 6 10*3/uL (ref 4.0–10.5)
nRBC: 0 % (ref 0.0–0.2)

## 2021-10-13 LAB — GLUCOSE, CAPILLARY
Glucose-Capillary: 196 mg/dL — ABNORMAL HIGH (ref 70–99)
Glucose-Capillary: 214 mg/dL — ABNORMAL HIGH (ref 70–99)
Glucose-Capillary: 156 mg/dL — ABNORMAL HIGH (ref 70–99)

## 2021-10-13 LAB — APTT: aPTT: 99 seconds — ABNORMAL HIGH (ref 24–36)

## 2021-10-13 MED ORDER — CIPROFLOXACIN HCL 500 MG PO TABS: 500.0000 mg | ORAL_TABLET | Freq: Two times a day (BID) | ORAL | Status: DC

## 2021-10-13 MED ORDER — AMOXICILLIN 500 MG PO CAPS: 500.0000 mg | ORAL_CAPSULE | Freq: Three times a day (TID) | ORAL | 0 refills | Status: DC

## 2021-10-13 NOTE — Progress Notes (Signed)
[place] for heparin infusion initiation & monitoring Indication: h/o pulmonary embolus  Allergies  Allergen Reactions   Farxiga [Dapagliflozin] Rash    Causes a severe rash in groin area    Dulaglutide Diarrhea and Other (See Comments)   Liraglutide Diarrhea and Other (See Comments)   Jardiance [Empagliflozin] Rash    Patient Measurements: Height: '5\' 11"'$  (180.3 cm) Weight: 115 kg (253 lb 8.5 oz) IBW/kg (Calculated) : 75.3 Heparin Dosing Weight: 100.4 kg  Vital Signs: Temp: 97.7 F (36.5 C) (07/24 0743) Temp Source: Oral (07/24 0348) BP: 138/70 (07/24 0743) Pulse Rate: 53 (07/24 0743)  Labs: Recent Labs    10/11/21 2046 10/12/21 2302 10/13/21 0356 10/13/21 0734  HGB 14.3  --  10.0*  --   HCT 44.2  --  30.3*  --   PLT 299  --  230  --   APTT  --  92*  --  99*  HEPARINUNFRC  --  >1.10*  --   --   CREATININE 1.64*  --  1.85*  --      Estimated Creatinine Clearance: 40.4 mL/min (A) (by C-G formula based on SCr of 1.85 mg/dL (H)).   Medical History: Past Medical History:  Diagnosis Date   Acute urinary retention    Arthritis    Asthma 2000   Back pain    Colon polyp 2012   COPD (chronic obstructive pulmonary disease) (HCC)    Emphysema of lung (HCC)    Heart murmur    Hernia 2000   Hyperlipidemia    Hypertension    Personal history of malignant neoplasm of large intestine    Personal history of tobacco use, presenting hazards to health    Sleep apnea    Special screening for malignant neoplasms, colon    Type 2 diabetes mellitus with proteinuria ([place]) 10/11/2014    Medications:  PTA: Eliquis '5mg'$  BID Last dose: 7/22 @ 0800 Inpatient: Heparin drip 7/23 >> Allergies: No APT/AC related allergies   Assessment: 81 y.o. male w/ PMH of BPS w/ LUTS, COPD, HTN recently hospitalized from 6/30 to 7/2 with an acute PE on apixaban which was stopped due to hematuria. Last dose prior to  admission  Date Time aPTT/HL Rate/Comment 7/23 2302  92/ >1.10 Therapeutic, not correlating cont 1900 units/hr 7/24 0734 99/---  Therapeutic, cont 1900 units/hr   Goal of Therapy:  Heparin level 0.3-0.7 units/ml aPTT 66-102 seconds Monitor platelets by anticoagulation protocol: Yes   Plan:  Continue heparin infusion at 1900 units/hr (no bolus per MD request) Check confirmatory aPTT in 8 hours and daily while on heparin F/u aPTT until it correlates with HL  (check HL once daily to assess) Continue to monitor H&H and platelets, CBC daily  Darrick Penna 10/13/2021,8:18 AM

## 2021-10-13 NOTE — Telephone Encounter (Signed)
can you please change his monthly foley change appt from this week to a month from now.  Appt changed with patient's wife   Sharyn Lull

## 2021-10-13 NOTE — Plan of Care (Signed)

## 2021-10-13 NOTE — Progress Notes (Unsigned)
Established Patient Office Visit  Name: Justin Griffith   MRN: [id number]    DOB: [birth year]   Date:10/13/2021  Today's Provider: Talitha Givens, MHS, PA-C Introduced myself to the patient as a PA-C and provided education on APPs in clinical practice.         Subjective  Chief Complaint  No chief complaint on file.   HPI   Patient Active Problem List   Diagnosis Date Noted   Gross hematuria 10/12/2021   Chronic anticoagulation 10/12/2021   Stage 3b chronic kidney disease (CKD) ([place]) 10/12/2021   History of pulmonary embolism 09/18/21 10/06/2021   Pulmonary emboli ([place]) 09/20/2021   Acute pulmonary embolism ([place]) 09/19/2021   Essential hypertension 09/19/2021   Type 2 diabetes mellitus without complications ([place]) 18/56/3149   Dyslipidemia 09/19/2021   Hyperlipidemia 09/11/2021   Cannot urinate 09/11/2021   Urinary tract infection without hematuria 07/08/2021   Acute renal failure ([place]) 07/08/2021   Generalized weakness 06/25/2021   Lymphedema 03/17/2021   Headache disorder 02/27/2021   Falls frequently 11/28/2020   Weakness of lower extremity 11/28/2020   Aortic atherosclerosis ([place]) 70/26/3785   Embolic stroke ([place]) 88/50/2774   AKI (acute kidney injury) ([place]) 05/18/2020   Leukocytosis 05/16/2020   Hypercalcemia 05/16/2020   Acute on chronic diastolic CHF (congestive heart failure) ([place]) 05/16/2020   At high risk for falls 05/08/2020   History of cold sores 05/06/2020   Hypomagnesemia 03/29/2020   Urinary tract infection 03/29/2020   NSTEMI (non-ST elevated myocardial infarction) ([place]) 03/28/2020   Hypokalemia 03/28/2020   Elevated TSH 02/25/2020   Poor mobility 02/02/2020   Coronary artery disease 01/23/2020   History of CVA (cerebrovascular accident) 01/23/2020   Memory changes 01/01/2020   Chronic venous insufficiency 05/23/2019   B12 deficiency 04/23/2019   Constipation 04/21/2019   Tremor 11/14/2018   Morbid obesity ([place]) 09/07/2017   Chronic  obstructive pulmonary disease (COPD) ([place]) 04/29/2017   Intertrigo 02/02/2017   BPH with obstruction/lower urinary tract symptoms Foley dependent 07/23/2016   Advanced care planning/counseling discussion 12/87/8676   Eosinophilic esophagitis 72/11/4707   PAD (peripheral artery disease) ([place]) 01/08/2016   Gastroesophageal reflux disease without esophagitis 01/06/2016   Hyperlipidemia associated with type 2 diabetes mellitus ([place]) 01/16/2015   Sleep apnea 01/16/2015   BMI 40.0-44.9, adult ([place]) 01/16/2015   Hypertension associated with diabetes ([place]) 10/11/2014   Type 2 diabetes mellitus with proteinuria ([place]) 10/11/2014   Back pain 10/11/2014   History of colon cancer     Past Surgical History:  Procedure Laterality Date   CARDIAC CATHETERIZATION Left 09/25/2015   Procedure: Left Heart Cath and Coronary Angiography;  Surgeon: Yolonda Kida, MD;  Location: [facility];  Service: Cardiovascular;  Laterality: Left;   CHOLECYSTECTOMY  1970   COLON SURGERY  07-16-99   sigmoid colon resection with primary anastomosis, chemotherapy for metastatic disease   COLONOSCOPY  2001, 2012   Dr Bary Castilla, tubular adenoma of the cecum and ascending colon in 2012.   COLONOSCOPY WITH PROPOFOL N/A 02/12/2016   Procedure: COLONOSCOPY WITH PROPOFOL;  Surgeon: Robert Bellow, MD;  Location: [facility];  Service: Endoscopy;  Laterality: N/A;   CORONARY STENT INTERVENTION N/A 05/17/2020   Procedure: CORONARY STENT INTERVENTION;  Surgeon: Yolonda Kida, MD;  Location: [facility];  Service: Cardiovascular;  Laterality: N/A;  LAD   ESOPHAGOGASTRODUODENOSCOPY (EGD) WITH PROPOFOL N/A 02/12/2016   Procedure: ESOPHAGOGASTRODUODENOSCOPY (EGD) WITH PROPOFOL;  Surgeon: Robert Bellow,  MD;  Location: [facility] ENDOSCOPY;  Service: Endoscopy;  Laterality: N/A;   HERNIA REPAIR Right    right inguinal hernia repair   JOINT REPLACEMENT Bilateral    knee replacement   KNEE SURGERY Bilateral 2010    LEFT HEART CATH AND CORONARY ANGIOGRAPHY N/A 05/17/2020   Procedure: LEFT HEART CATH AND CORONARY ANGIOGRAPHY possible PCI and stent;  Surgeon: Yolonda Kida, MD;  Location: [facility];  Service: Cardiovascular;  Laterality: N/A;   MYRINGOTOMY WITH TUBE PLACEMENT Bilateral 08/16/2014   Procedure: MYRINGOTOMY WITH TUBE PLACEMENT;  Surgeon: Carloyn Manner, MD;  Location: [facility] OR[facility];  Service: ENT;  Laterality: Bilateral;   PERIPHERAL VASCULAR CATHETERIZATION Right 09/04/2015   Procedure: Lower Extremity Angiography;  Surgeon: Katha Cabal, MD;  Location: [place] [facility];  Service: Cardiovascular;  Laterality: Right;   PERIPHERAL VASCULAR CATHETERIZATION  09/04/2015   Procedure: Lower Extremity Intervention;  Surgeon: Katha Cabal, MD;  Location: [place] [facility];  Service: Cardiovascular;;   TONSILLECTOMY     TYMPANOSTOMY TUBE PLACEMENT      Family History  Problem Relation Age of Onset   Diabetes Father    Esophageal cancer Mother    Alzheimer's disease Paternal Uncle     Social History   Tobacco Use   Smoking status: Former    Packs/day: 1.00    Years: 30.00    Total pack years: 30.00    Types: Cigarettes    Quit date: 03/23/1989    Years since quitting: 32.5    Passive exposure: Past   Smokeless tobacco: Never  Substance Use Topics   Alcohol use: No    Alcohol/week: 0.0 standard drinks of alcohol    No current facility-administered medications for this visit. No current outpatient medications on file.  Facility-Administered Medications Ordered in Other Visits:    acetaminophen (TYLENOL) tablet 650 mg, 650 mg, Oral, Q6H PRN **OR** acetaminophen (TYLENOL) suppository 650 mg, 650 mg, Rectal, Q6H PRN, Athena Masse, MD   amoxicillin (AMOXIL) capsule 500 mg, 500 mg, Oral, Q8H, Wouk, Ailene Rud, MD, 500 mg at 10/13/21 0651   carvedilol (COREG) tablet 25 mg, 25 mg, Oral, BID WC, Judd Gaudier V, MD, 25 mg at 10/12/21 1732   Chlorhexidine  Gluconate Cloth 2 % PADS 6 each, 6 each, Topical, Daily, Wouk, Ailene Rud, MD   ciprofloxacin (CIPRO) tablet 500 mg, 500 mg, Oral, BID, Wouk, Ailene Rud, MD, 500 mg at 10/12/21 2004   cloNIDine (CATAPRES) tablet 0.1 mg, 0.1 mg, Oral, BID, Wouk, Ailene Rud, MD, 0.1 mg at 10/12/21 2220   heparin ADULT infusion 100 units/mL (25000 units/270m), 1,900 Units/hr, Intravenous, Continuous, GDallie Piles RPH, Last Rate: 19 mL/hr at 10/13/21 0344, 1,900 Units/hr at 10/13/21 0344   insulin aspart (novoLOG) injection 0-15 Units, 0-15 Units, Subcutaneous, TID WC, DAthena Masse MD, 3 Units at 10/12/21 1736   insulin aspart (novoLOG) injection 0-5 Units, 0-5 Units, Subcutaneous, QHS, DDamita Dunnings HWaldemar Dickens MD   lidocaine (XYLOCAINE) 2 % jelly 1 Application, 1 Application, Urethral, Once, RMerlyn Lot MD   mirabegron ER ([facility] tablet 50 mg, 50 mg, Oral, Daily, DJudd GaudierV, MD, 50 mg at 10/12/21 0947   mometasone-formoterol (DULERA) 100-5 MCG/ACT inhaler 2 puff, 2 puff, Inhalation, BID, 2 puff at 10/12/21 2006 **AND** umeclidinium bromide (INCRUSE ELLIPTA) 62.5 MCG/ACT 1 puff, 1 puff, Inhalation, Daily, MChinita GreenlandA, RPH, 1 puff at 10/12/21 0900   morphine (PF) 4 MG/ML injection 4 mg, 4 mg, Intravenous, Q3H PRN, RMerlyn Lot  MD   nortriptyline (PAMELOR) capsule 10 mg, 10 mg, Oral, BID, Wouk, Ailene Rud, MD, 10 mg at 10/12/21 1732   ondansetron (ZOFRAN) injection 4 mg, 4 mg, Intravenous, Once, Merlyn Lot, MD   ondansetron [facility]) tablet 4 mg, 4 mg, Oral, Q6H PRN **OR** ondansetron (ZOFRAN) injection 4 mg, 4 mg, Intravenous, Q6H PRN, Athena Masse, MD   oxybutynin [facility]) tablet 2.5 mg, 2.5 mg, Oral, Daily, Wouk, Ailene Rud, MD, 2.5 mg at 10/12/21 1732   rosuvastatin (CRESTOR) tablet 40 mg, 40 mg, Oral, QHS, Judd Gaudier V, MD, 40 mg at 10/12/21 2220   [EXPIRED] Continuous Bladder Irrigation, , , Until Discontinued **AND** sodium chloride irrigation 0.9 % 3,000 mL,  3,000 mL, Irrigation, Continuous, Quale, Mark, MD, 3,000 mL at 10/13/21 0559   [EXPIRED] Continuous Bladder Irrigation, , , Until Discontinued **AND** sodium chloride irrigation 0.9 % 3,000 mL, 3,000 mL, Irrigation, Continuous, Quale, Mark, MD, 3,000 mL at 10/12/21 7673  Allergies  Allergen Reactions   Wilder Glade [Dapagliflozin] Rash    Causes a severe rash in groin area    Dulaglutide Diarrhea and Other (See Comments)   Liraglutide Diarrhea and Other (See Comments)   Jardiance [Empagliflozin] Rash    I personally reviewed {Reviewed:[id number]} with the patient/caregiver today.   ROS    Objective  There were no vitals filed for this visit.  There is no height or weight on file to calculate BMI.  Physical Exam   Recent Results (from the past 2160 hour(s))  Bayer DCA Hb A1c Waived     Status: Abnormal   Collection Time: 09/05/21 11:05 AM  Result Value Ref Range   HB A1C (BAYER DCA - WAIVED) 8.7 (H) 4.8 - 5.6 %    Comment:          Prediabetes: 5.7 - 6.4          Diabetes: >6.4          Glycemic control for adults with diabetes: <7.0   Lipid panel     Status: Abnormal   Collection Time: 09/05/21  1:47 PM  Result Value Ref Range   Cholesterol, Total 255 (H) 100 - 199 mg/dL   Triglycerides 615 (HH) 0 - 149 mg/dL   HDL 30 (L) >39 mg/dL   VLDL Cholesterol Cal 109 (H) 5 - 40 mg/dL   LDL Chol Calc (NIH) 116 (H) 0 - 99 mg/dL   Chol/HDL Ratio 8.5 (H) 0.0 - 5.0 ratio    Comment:                                   T. Chol/HDL Ratio                                             Men  Women                               1/2 Avg.Risk  3.4    3.3                                   Avg.Risk  5.0    4.4  2X Avg.Risk  9.6    7.1                                3X Avg.Risk 23.4   60.4   Basic metabolic panel     Status: Abnormal   Collection Time: 09/05/21  1:47 PM  Result Value Ref Range   Glucose 229 (H) 70 - 99 mg/dL   BUN 18 8 - 27 mg/dL   Creatinine, Ser  1.18 0.76 - 1.27 mg/dL   eGFR 62 >59 mL/min/1.73   BUN/Creatinine Ratio 15 10 - 24   Sodium 139 134 - 144 mmol/L   Potassium 4.9 3.5 - 5.2 mmol/L   Chloride 99 96 - 106 mmol/L   CO2 19 (L) 20 - 29 mmol/L   Calcium 10.4 (H) 8.6 - 10.2 mg/dL  CBC     Status: None   Collection Time: 09/11/21  1:00 PM  Result Value Ref Range   WBC 6.1 4.0 - 10.5 K/uL   RBC 4.61 4.22 - 5.81 MIL/uL   Hemoglobin 13.8 13.0 - 17.0 g/dL   HCT 41.9 39.0 - 52.0 %   MCV 90.9 80.0 - 100.0 fL   MCH 29.9 26.0 - 34.0 pg   MCHC 32.9 30.0 - 36.0 g/dL   RDW 12.8 11.5 - 15.5 %   Platelets 224 150 - 400 K/uL   nRBC 0.0 0.0 - 0.2 %    Comment: Performed at [facility], [address] [place], [place] [address]  Basic metabolic panel     Status: Abnormal   Collection Time: 09/11/21  1:00 PM  Result Value Ref Range   Sodium 138 135 - 145 mmol/L   Potassium 3.7 3.5 - 5.1 mmol/L   Chloride 104 98 - 111 mmol/L   CO2 25 22 - 32 mmol/L   Glucose, Bld 237 (H) 70 - 99 mg/dL    Comment: Glucose reference range applies only to samples taken after fasting for at least 8 hours.   BUN 33 (H) 8 - 23 mg/dL   Creatinine, Ser 2.87 (H) 0.61 - 1.24 mg/dL   Calcium 10.0 8.9 - 10.3 mg/dL   GFR, Estimated 21 (L) >60 mL/min    Comment: (NOTE) Calculated using the CKD-EPI Creatinine Equation (2021)    Anion gap 9 5 - 15    Comment: Performed at [facility], [address] [place], [place] [address]  Urinalysis, Routine w reflex microscopic     Status: Abnormal   Collection Time: 09/11/21  1:00 PM  Result Value Ref Range   Color, Urine YELLOW (A) YELLOW   APPearance CLOUDY (A) CLEAR   Specific Gravity, Urine 1.015 1.005 - 1.030   pH 5.0 5.0 - 8.0   Glucose, UA 150 (A) NEGATIVE mg/dL   Hgb urine dipstick NEGATIVE NEGATIVE   Bilirubin Urine NEGATIVE NEGATIVE   Ketones, ur NEGATIVE NEGATIVE mg/dL   Protein, ur >=300 (A) NEGATIVE mg/dL   Nitrite NEGATIVE NEGATIVE   Leukocytes,Ua TRACE (A) NEGATIVE   RBC /  HPF 6-10 0 - 5 RBC/hpf   WBC, UA 11-20 0 - 5 WBC/hpf   Bacteria, UA MANY (A) NONE SEEN   Squamous Epithelial / LPF 0-5 0 - 5   WBC Clumps PRESENT    Mucus PRESENT    Hyaline Casts, UA PRESENT     Comment: Performed at [facility], [address] [place], [place] [address]  Urine  Culture     Status: None   Collection Time: 09/11/21  2:43 PM   Specimen: Urine, Random  Result Value Ref Range   Specimen Description      URINE, RANDOM Performed at [facility], [address], [place], [place] [address]    Special Requests      NONE Performed at [facility], [address], [place], [place] [address]    Culture      NO GROWTH Performed at [facility], [place], [place], [place] [address]    Report Status 09/12/2021 FINAL   Urinalysis, Routine w reflex microscopic Urine, Catheterized     Status: Abnormal   Collection Time: 09/15/21  9:01 PM  Result Value Ref Range   Color, Urine YELLOW (A) YELLOW   APPearance CLOUDY (A) CLEAR   Specific Gravity, Urine 1.013 1.005 - 1.030   pH 5.0 5.0 - 8.0   Glucose, UA 50 (A) NEGATIVE mg/dL   Hgb urine dipstick LARGE (A) NEGATIVE   Bilirubin Urine NEGATIVE NEGATIVE   Ketones, ur NEGATIVE NEGATIVE mg/dL   Protein, ur 100 (A) NEGATIVE mg/dL   Nitrite NEGATIVE NEGATIVE   Leukocytes,Ua NEGATIVE NEGATIVE   RBC / HPF >50 (H) 0 - 5 RBC/hpf   WBC, UA 11-20 0 - 5 WBC/hpf   Bacteria, UA RARE (A) NONE SEEN   Squamous Epithelial / LPF NONE SEEN 0 - 5   WBC Clumps PRESENT    Mucus PRESENT     Comment: Performed at [facility], [address] [place], [place] [address]  Comprehensive metabolic panel     Status: Abnormal   Collection Time: 09/15/21 10:26 PM  Result Value Ref Range   Sodium 138 135 - 145 mmol/L   Potassium 4.2 3.5 - 5.1 mmol/L   Chloride 102 98 - 111 mmol/L   CO2 27 22 - 32 mmol/L   Glucose, Bld 213 (H) 70 - 99 mg/dL    Comment: Glucose reference range applies only to samples  taken after fasting for at least 8 hours.   BUN 39 (H) 8 - 23 mg/dL   Creatinine, Ser 2.57 (H) 0.61 - 1.24 mg/dL   Calcium 9.6 8.9 - 10.3 mg/dL   Total Protein 7.1 6.5 - 8.1 g/dL   Albumin 3.0 (L) 3.5 - 5.0 g/dL   AST 16 15 - 41 U/L   ALT 12 0 - 44 U/L   Alkaline Phosphatase 61 38 - 126 U/L   Total Bilirubin 0.9 0.3 - 1.2 mg/dL   GFR, Estimated 24 (L) >60 mL/min    Comment: (NOTE) Calculated using the CKD-EPI Creatinine Equation (2021)    Anion gap 9 5 - 15    Comment: Performed at [facility], [address] [place],  [address]  CBC with Differential     Status: Abnormal   Collection Time: 09/15/21 10:26 PM  Result Value Ref Range   WBC 9.8 4.0 - 10.5 K/uL   RBC 4.44 4.22 - 5.81 MIL/uL   Hemoglobin 13.2 13.0 - 17.0 g/dL   HCT 40.2 39.0 - 52.0 %   MCV 90.5 80.0 - 100.0 fL   MCH 29.7 26.0 - 34.0 pg   MCHC 32.8 30.0 - 36.0 g/dL   RDW 12.3 11.5 - 15.5 %   Platelets 210 150 - 400 K/uL   nRBC 0.0 0.0 - 0.2 %   Neutrophils Relative % 79 %   Neutro Abs 7.9 (H) 1.7 - 7.7 K/uL  Lymphocytes Relative 10 %   Lymphs Abs 1.0 0.7 - 4.0 K/uL   Monocytes Relative 8 %   Monocytes Absolute 0.8 0.1 - 1.0 K/uL   Eosinophils Relative 1 %   Eosinophils Absolute 0.1 0.0 - 0.5 K/uL   Basophils Relative 1 %   Basophils Absolute 0.1 0.0 - 0.1 K/uL   Immature Granulocytes 1 %   Abs Immature Granulocytes 0.05 0.00 - 0.07 K/uL    Comment: Performed at [facility], [address] [place], [place] [address]  BLADDER SCAN AMB NON-IMAGING     Status: None   Collection Time: 09/18/21  1:42 PM  Result Value Ref Range   Scan Result 465m   Urinalysis, Complete     Status: Abnormal   Collection Time: 09/18/21  2:21 PM  Result Value Ref Range   Specific Gravity, UA 1.015 1.005 - 1.030   pH, UA 6.0 5.0 - 7.5   Color, UA Yellow Yellow   Appearance Ur Cloudy (A) Clear   Leukocytes,UA 1+ (A) Negative   Protein,UA 3+ (A) Negative/Trace   Glucose, UA Trace (A) Negative    Ketones, UA Negative Negative   RBC, UA 3+ (A) Negative   Bilirubin, UA Negative Negative   Urobilinogen, Ur 0.2 0.2 - 1.0 mg/dL   Nitrite, UA Positive (A) Negative   Microscopic Examination See below:   CULTURE, URINE COMPREHENSIVE     Status: Abnormal   Collection Time: 09/18/21  2:21 PM   Specimen: Urine   UR  Result Value Ref Range   Urine Culture, Comprehensive Final report (A)    Organism ID, Bacteria Comment (A)     Comment: Pseudomonas aeruginosa 50,000-100,000 colony forming units per mL    Organism ID, Bacteria Comment (A)     Comment: Beta hemolytic Streptococcus, group B 50,000-100,000 colony forming units per mL Penicillin and ampicillin are drugs of choice for treatment of beta-hemolytic streptococcal infections. Susceptibility testing of penicillins and other beta-lactam agents approved by the FDA for treatment of beta-hemolytic streptococcal infections need not be performed routinely because nonsusceptible isolates are extremely rare in any beta-hemolytic streptococcus and have not been reported for Streptococcus pyogenes (group A). (CLSI)    ANTIMICROBIAL SUSCEPTIBILITY Comment     Comment:       ** S = Susceptible; I = Intermediate; R = Resistant **                    P = Positive; N = Negative             MICS are expressed in micrograms per mL    Antibiotic                 RSLT#1    RSLT#2    RSLT#3    RSLT#4 Amikacin                       S Cefepime                       S Ceftazidime                    S Ciprofloxacin                  S Gentamicin                     S Imipenem  S Levofloxacin                   S Meropenem                      S Piperacillin                   S Ticarcillin                    S Tobramycin                     S   Microscopic Examination     Status: Abnormal   Collection Time: 09/18/21  2:21 PM   Urine  Result Value Ref Range   WBC, UA >30 (A) 0 - 5 /hpf   RBC, Urine >30 (A) 0 - 2 /hpf    Epithelial Cells (non renal) 0-10 0 - 10 /hpf   Bacteria, UA Many (A) None seen/Few  BLADDER SCAN AMB NON-IMAGING     Status: None   Collection Time: 09/19/21 10:07 AM  Result Value Ref Range   Scan Result >867m   Blood Culture (routine x 2)     Status: None   Collection Time: 09/19/21  4:47 PM   Specimen: BLOOD  Result Value Ref Range   Specimen Description BLOOD BLOOD LEFT FOREARM    Special Requests      BOTTLES DRAWN AEROBIC AND ANAEROBIC Blood Culture adequate volume   Culture      NO GROWTH 5 DAYS Performed at [facility] [address], [place] [address]   Report Status 09/24/2021 FINAL   Lactic acid, plasma     Status: None   Collection Time: 09/19/21  4:50 PM  Result Value Ref Range   Lactic Acid, Venous 1.2 0.5 - 1.9 mmol/L    Comment: Performed at [facility] [address], [place] [address] Comprehensive metabolic panel     Status: Abnormal   Collection Time: 09/19/21  4:50 PM  Result Value Ref Range   Sodium 138 135 - 145 mmol/L   Potassium 3.3 (L) 3.5 - 5.1 mmol/L   Chloride 104 98 - 111 mmol/L   CO2 24 22 - 32 mmol/L   Glucose, Bld 197 (H) 70 - 99 mg/dL    Comment: Glucose reference range applies only to samples taken after fasting for at least 8 hours.   BUN 16 8 - 23 mg/dL   Creatinine, Ser 1.59 (H) 0.61 - 1.24 mg/dL   Calcium 9.3 8.9 - 10.3 mg/dL   Total Protein 6.6 6.5 - 8.1 g/dL   Albumin 2.9 (L) 3.5 - 5.0 g/dL   AST 16 15 - 41 U/L   ALT 13 0 - 44 U/L   Alkaline Phosphatase 56 38 - 126 U/L   Total Bilirubin 1.1 0.3 - 1.2 mg/dL   GFR, Estimated 43 (L) >60 mL/min    Comment: (NOTE) Calculated using the CKD-EPI Creatinine Equation (2021)    Anion gap 10 5 - 15    Comment: [facility] at [facility] [address], [place] [address] CBC with Differential     Status: Abnormal   Collection Time: 09/19/21  4:50 PM  Result Value Ref Range   WBC 13.5 (H) 4.0 - 10.5 K/uL   RBC 4.03 (L) 4.22  - 5.81 MIL/uL   Hemoglobin 12.2 (L) 13.0 - 17.0 g/dL  HCT 36.0 (L) 39.0 - 52.0 %   MCV 89.3 80.0 - 100.0 fL   MCH 30.3 26.0 - 34.0 pg   MCHC 33.9 30.0 - 36.0 g/dL   RDW 12.0 11.5 - 15.5 %   Platelets 230 150 - 400 K/uL   nRBC 0.0 0.0 - 0.2 %   Neutrophils Relative % 84 %   Neutro Abs 11.4 (H) 1.7 - 7.7 K/uL   Lymphocytes Relative 7 %   Lymphs Abs 1.0 0.7 - 4.0 K/uL   Monocytes Relative 7 %   Monocytes Absolute 1.0 0.1 - 1.0 K/uL   Eosinophils Relative 1 %   Eosinophils Absolute 0.1 0.0 - 0.5 K/uL   Basophils Relative 0 %   Basophils Absolute 0.1 0.0 - 0.1 K/uL   Immature Granulocytes 1 %   Abs Immature Granulocytes 0.07 0.00 - 0.07 K/uL    Comment: Performed at [name], [address] [place], [place] [address]  Protime-INR     Status: Abnormal   Collection Time: 09/19/21  4:50 PM  Result Value Ref Range   Prothrombin Time 15.6 (H) 11.4 - 15.2 seconds   INR 1.3 (H) 0.8 - 1.2    Comment: (NOTE) INR goal varies based on device and disease states. Performed at [facility], [address] [place], [place] [address]   APTT     Status: None   Collection Time: 09/19/21  4:50 PM  Result Value Ref Range   aPTT 29 24 - 36 seconds    Comment: Performed at [facility], [address] [place], [place] [address]  CK     Status: None   Collection Time: 09/19/21  4:50 PM  Result Value Ref Range   Total CK 114 49 - 397 U/L    Comment: Performed at [facility], [place], [place] [address]  Troponin I (High Sensitivity)     Status: Abnormal   Collection Time: 09/19/21  4:50 PM  Result Value Ref Range   Troponin I (High Sensitivity) 42 (H) <18 ng/L    Comment: (NOTE) Elevated high sensitivity troponin I (hsTnI) values and significant  changes across serial measurements may suggest ACS but many other  chronic and acute conditions are known to elevate hsTnI results.  Refer to the "Links" section for chest pain algorithms  and additional  guidance. Performed at [facility], [address] [place], [place] [address]   Magnesium     Status: Abnormal   Collection Time: 09/19/21  4:50 PM  Result Value Ref Range   Magnesium 1.4 (L) 1.7 - 2.4 mg/dL    Comment: Performed at [name], [address] [place],  [address]  Procalcitonin - Baseline     Status: None   Collection Time: 09/19/21  4:50 PM  Result Value Ref Range   Procalcitonin <0.10 ng/mL    Comment:        Interpretation: PCT (Procalcitonin) <= 0.5 ng/mL: Systemic infection (sepsis) is not likely. Local bacterial infection is possible. (NOTE)       Sepsis PCT Algorithm           Lower Respiratory Tract                                      Infection PCT Algorithm    ----------------------------     ----------------------------  PCT < 0.25 ng/mL                PCT < 0.10 ng/mL          Strongly encourage             Strongly discourage   discontinuation of antibiotics    initiation of antibiotics    ----------------------------     -----------------------------       PCT 0.25 - 0.50 ng/mL            PCT 0.10 - 0.25 ng/mL               OR       >80% decrease in PCT            Discourage initiation of                                            antibiotics      Encourage discontinuation           of antibiotics    ----------------------------     -----------------------------         PCT >= 0.50 ng/mL              PCT 0.26 - 0.50 ng/mL               AND        <80% decrease in PCT             Encourage initiation of                                             antibiotics       Encourage continuation           of antibiotics    ----------------------------     -----------------------------        PCT >= 0.50 ng/mL                  PCT > 0.50 ng/mL               AND         increase in PCT                  Strongly encourage                                      initiation of antibiotics    Strongly  encourage escalation           of antibiotics                                     -----------------------------                                           PCT <= 0.25 ng/mL  OR                                        > 80% decrease in PCT                                      Discontinue / Do not initiate                                             antibiotics  Performed at [facility], [address] [place], [place] [address]   Blood Culture (routine x 2)     Status: None   Collection Time: 09/19/21  4:52 PM   Specimen: BLOOD  Result Value Ref Range   Specimen Description BLOOD BLOOD LEFT HAND    Special Requests      BOTTLES DRAWN AEROBIC AND ANAEROBIC Blood Culture results may not be optimal due to an inadequate volume of blood received in culture bottles   Culture      NO GROWTH 5 DAYS Performed at [facility], [address] [place], [place] [address]    Report Status 09/24/2021 FINAL   Urinalysis, Complete w Microscopic     Status: Abnormal   Collection Time: 09/19/21  5:19 PM  Result Value Ref Range   Color, Urine YELLOW (A) YELLOW   APPearance HAZY (A) CLEAR   Specific Gravity, Urine 1.011 1.005 - 1.030   pH 6.0 5.0 - 8.0   Glucose, UA 50 (A) NEGATIVE mg/dL   Hgb urine dipstick MODERATE (A) NEGATIVE   Bilirubin Urine NEGATIVE NEGATIVE   Ketones, ur 5 (A) NEGATIVE mg/dL   Protein, ur >=300 (A) NEGATIVE mg/dL   Nitrite NEGATIVE NEGATIVE   Leukocytes,Ua MODERATE (A) NEGATIVE   RBC / HPF >50 (H) 0 - 5 RBC/hpf   WBC, UA >50 (H) 0 - 5 WBC/hpf   Bacteria, UA RARE (A) NONE SEEN   Squamous Epithelial / LPF NONE SEEN 0 - 5   Mucus PRESENT     Comment: Performed at [facility], [address], [place], [place] [address]  Urine Culture     Status: Abnormal   Collection Time: 09/19/21  5:19 PM   Specimen: In/Out Cath Urine  Result Value Ref Range   Specimen Description      IN/OUT CATH  URINE Performed at [facility], [address] [place], [place] [address]    Special Requests      NONE Performed at [facility], [address] [place], [place] [address]    Culture (A)     >=100,000 COLONIES/mL PSEUDOMONAS AERUGINOSA 70,000 COLONIES/mL ENTEROCOCCUS FAECALIS    Report Status 09/22/2021 FINAL    Organism ID, Bacteria PSEUDOMONAS AERUGINOSA (A)    Organism ID, Bacteria ENTEROCOCCUS FAECALIS (A)       Susceptibility   Enterococcus faecalis - MIC*    AMPICILLIN <=2 SENSITIVE Sensitive     NITROFURANTOIN <=16 SENSITIVE Sensitive     VANCOMYCIN 1 SENSITIVE Sensitive     * 70,000 COLONIES/mL ENTEROCOCCUS FAECALIS   Pseudomonas aeruginosa - MIC*    CEFTAZIDIME 4 SENSITIVE Sensitive     CIPROFLOXACIN <=0.25 SENSITIVE Sensitive     GENTAMICIN <=  1 SENSITIVE Sensitive     IMIPENEM 2 SENSITIVE Sensitive     PIP/TAZO 8 SENSITIVE Sensitive     * >=100,000 COLONIES/mL PSEUDOMONAS AERUGINOSA  Lactic acid, plasma     Status: None   Collection Time: 09/19/21  7:40 PM  Result Value Ref Range   Lactic Acid, Venous 0.9 0.5 - 1.9 mmol/L    Comment: Performed at [facility], [place], [place] [address]  Troponin I (High Sensitivity)     Status: Abnormal   Collection Time: 09/19/21  7:40 PM  Result Value Ref Range   Troponin I (High Sensitivity) 47 (H) <18 ng/L    Comment: (NOTE) Elevated high sensitivity troponin I (hsTnI) values and significant  changes across serial measurements may suggest ACS but many other  chronic and acute conditions are known to elevate hsTnI results.  Refer to the "Links" section for chest pain algorithms and additional  guidance. Performed at [facility], [address] [place], [place] [address]   CBG monitoring, ED     Status: Abnormal   Collection Time: 09/19/21 10:27 PM  Result Value Ref Range   Glucose-Capillary 132 (H) 70 - 99 mg/dL    Comment: Glucose reference range applies only  to samples taken after fasting for at least 8 hours.  Procalcitonin     Status: None   Collection Time: 09/20/21  3:49 AM  Result Value Ref Range   Procalcitonin 0.28 ng/mL    Comment:        Interpretation: PCT (Procalcitonin) <= 0.5 ng/mL: Systemic infection (sepsis) is not likely. Local bacterial infection is possible. (NOTE)       Sepsis PCT Algorithm           Lower Respiratory Tract                                      Infection PCT Algorithm    ----------------------------     ----------------------------         PCT < 0.25 ng/mL                PCT < 0.10 ng/mL          Strongly encourage             Strongly discourage   discontinuation of antibiotics    initiation of antibiotics    ----------------------------     -----------------------------       PCT 0.25 - 0.50 ng/mL            PCT 0.10 - 0.25 ng/mL               OR       >80% decrease in PCT            Discourage initiation of                                            antibiotics      Encourage discontinuation           of antibiotics    ----------------------------     -----------------------------         PCT >= 0.50 ng/mL              PCT 0.26 - 0.50 ng/mL  AND        <80% decrease in PCT             Encourage initiation of                                             antibiotics       Encourage continuation           of antibiotics    ----------------------------     -----------------------------        PCT >= 0.50 ng/mL                  PCT > 0.50 ng/mL               AND         increase in PCT                  Strongly encourage                                      initiation of antibiotics    Strongly encourage escalation           of antibiotics                                     -----------------------------                                           PCT <= 0.25 ng/mL                                                 OR                                        > 80% decrease in PCT                                       Discontinue / Do not initiate                                             antibiotics  Performed at [facility], [address] [place], [place] [address]   Heparin level (unfractionated)     Status: None   Collection Time: 09/20/21  3:49 AM  Result Value Ref Range   Heparin Unfractionated 0.53 0.30 - 0.70 IU/mL    Comment: (NOTE) The clinical reportable range upper limit is being lowered to >1.10 to align with the FDA approved guidance for the current laboratory assay.  If heparin results are below expected values, and patient dosage has  been confirmed, suggest follow up testing of antithrombin III levels. Performed at [facility], [address] [place], [place] [address]   CBC     Status: Abnormal   Collection Time: 09/20/21  3:49 AM  Result Value Ref Range   WBC 13.6 (H) 4.0 - 10.5 K/uL   RBC 4.59 4.22 - 5.81 MIL/uL   Hemoglobin 13.8 13.0 - 17.0 g/dL   HCT 41.2 39.0 - 52.0 %   MCV 89.8 80.0 - 100.0 fL   MCH 30.1 26.0 - 34.0 pg   MCHC 33.5 30.0 - 36.0 g/dL   RDW 12.1 11.5 - 15.5 %   Platelets 245 150 - 400 K/uL   nRBC 0.0 0.0 - 0.2 %    Comment: Performed at [facility], [address] [place], [place] [address]  CBG monitoring, ED     Status: Abnormal   Collection Time: 09/20/21  7:52 AM  Result Value Ref Range   Glucose-Capillary 130 (H) 70 - 99 mg/dL    Comment: Glucose reference range applies only to samples taken after fasting for at least 8 hours.  ECHOCARDIOGRAM COMPLETE     Status: None   Collection Time: 09/20/21 12:50 PM  Result Value Ref Range   Weight 4,256 oz   Height 70 in   BP 138/68 mmHg   Ao pk vel 2.36 m/s   AV Area VTI 1.56 cm2   AR max vel 1.48 cm2   AV Mean grad 13.0 mmHg   AV Peak grad 22.3 mmHg   S' Lateral 2.48 cm   AV Area mean vel 1.47 cm2   Area-P 1/2 2.63 cm2  CBG monitoring, ED     Status: Abnormal   Collection Time: 09/20/21  1:08 PM  Result Value Ref Range    Glucose-Capillary 180 (H) 70 - 99 mg/dL    Comment: Glucose reference range applies only to samples taken after fasting for at least 8 hours.  Heparin level (unfractionated)     Status: Abnormal   Collection Time: 09/20/21  1:50 PM  Result Value Ref Range   Heparin Unfractionated 0.20 (L) 0.30 - 0.70 IU/mL    Comment: (NOTE) The clinical reportable range upper limit is being lowered to >1.10 to align with the FDA approved guidance for the current laboratory assay.  If heparin results are below expected values, and patient dosage has  been confirmed, suggest follow up testing of antithrombin III levels. Performed at [facility], [address] [place],  [address]   Glucose, capillary     Status: Abnormal   Collection Time: 09/20/21  5:22 PM  Result Value Ref Range   Glucose-Capillary 147 (H) 70 - 99 mg/dL    Comment: Glucose reference range applies only to samples taken after fasting for at least 8 hours.   Comment 1 Notify RN    Comment 2 Document in Chart   Glucose, capillary     Status: Abnormal   Collection Time: 09/20/21  9:22 PM  Result Value Ref Range   Glucose-Capillary 190 (H) 70 - 99 mg/dL    Comment: Glucose reference range applies only to samples taken after fasting for at least 8 hours.  Procalcitonin     Status: None   Collection Time: 09/21/21  7:05 AM  Result Value Ref Range   Procalcitonin 0.30 ng/mL    Comment:        Interpretation: PCT (Procalcitonin) <= 0.5 ng/mL: Systemic infection (sepsis) is not likely. Local bacterial infection is possible. (NOTE)       Sepsis PCT Algorithm  Lower Respiratory Tract                                      Infection PCT Algorithm    ----------------------------     ----------------------------         PCT < 0.25 ng/mL                PCT < 0.10 ng/mL          Strongly encourage             Strongly discourage   discontinuation of antibiotics    initiation of antibiotics     ----------------------------     -----------------------------       PCT 0.25 - 0.50 ng/mL            PCT 0.10 - 0.25 ng/mL               OR       >80% decrease in PCT            Discourage initiation of                                            antibiotics      Encourage discontinuation           of antibiotics    ----------------------------     -----------------------------         PCT >= 0.50 ng/mL              PCT 0.26 - 0.50 ng/mL               AND        <80% decrease in PCT             Encourage initiation of                                             antibiotics       Encourage continuation           of antibiotics    ----------------------------     -----------------------------        PCT >= 0.50 ng/mL                  PCT > 0.50 ng/mL               AND         increase in PCT                  Strongly encourage                                      initiation of antibiotics    Strongly encourage escalation           of antibiotics                                     -----------------------------  PCT <= 0.25 ng/mL                                                 OR                                        > 80% decrease in PCT                                      Discontinue / Do not initiate                                             antibiotics  Performed at [facility], [address] [place], [place] [address]   CBC     Status: Abnormal   Collection Time: 09/21/21  7:05 AM  Result Value Ref Range   WBC 9.9 4.0 - 10.5 K/uL   RBC 3.77 (L) 4.22 - 5.81 MIL/uL   Hemoglobin 11.3 (L) 13.0 - 17.0 g/dL   HCT 33.5 (L) 39.0 - 52.0 %   MCV 88.9 80.0 - 100.0 fL   MCH 30.0 26.0 - 34.0 pg   MCHC 33.7 30.0 - 36.0 g/dL   RDW 12.3 11.5 - 15.5 %   Platelets 265 150 - 400 K/uL   nRBC 0.0 0.0 - 0.2 %    Comment: Performed at [facility], [address] [place], [place] [address]  Basic metabolic panel     Status:  Abnormal   Collection Time: 09/21/21  7:05 AM  Result Value Ref Range   Sodium 136 135 - 145 mmol/L   Potassium 2.8 (L) 3.5 - 5.1 mmol/L   Chloride 98 98 - 111 mmol/L   CO2 30 22 - 32 mmol/L   Glucose, Bld 124 (H) 70 - 99 mg/dL    Comment: Glucose reference range applies only to samples taken after fasting for at least 8 hours.   BUN 15 8 - 23 mg/dL   Creatinine, Ser 1.54 (H) 0.61 - 1.24 mg/dL   Calcium 9.2 8.9 - 10.3 mg/dL   GFR, Estimated 45 (L) >60 mL/min    Comment: (NOTE) Calculated using the CKD-EPI Creatinine Equation (2021)    Anion gap 8 5 - 15    Comment: Performed at [facility], [address] [place], [place] [address]  Glucose, capillary     Status: Abnormal   Collection Time: 09/21/21  7:55 AM  Result Value Ref Range   Glucose-Capillary 126 (H) 70 - 99 mg/dL    Comment: Glucose reference range applies only to samples taken after fasting for at least 8 hours.  Glucose, capillary     Status: Abnormal   Collection Time: 09/21/21 12:20 PM  Result Value Ref Range   Glucose-Capillary 171 (H) 70 - 99 mg/dL    Comment: Glucose reference range applies only to samples taken after fasting for at least 8 hours.  Lipase, blood     Status: None   Collection Time: 09/30/21  2:02 PM  Result Value Ref Range   Lipase 21  11 - 51 U/L    Comment: Performed at [facility], [address] [place], [place] [address]  Comprehensive metabolic panel     Status: Abnormal   Collection Time: 09/30/21  2:02 PM  Result Value Ref Range   Sodium 135 135 - 145 mmol/L   Potassium 4.7 3.5 - 5.1 mmol/L   Chloride 102 98 - 111 mmol/L   CO2 26 22 - 32 mmol/L   Glucose, Bld 230 (H) 70 - 99 mg/dL    Comment: Glucose reference range applies only to samples taken after fasting for at least 8 hours.   BUN 15 8 - 23 mg/dL   Creatinine, Ser 1.51 (H) 0.61 - 1.24 mg/dL   Calcium 10.3 8.9 - 10.3 mg/dL   Total Protein 7.9 6.5 - 8.1 g/dL   Albumin 3.3 (L) 3.5 - 5.0 g/dL   AST 14 (L)  15 - 41 U/L   ALT 13 0 - 44 U/L   Alkaline Phosphatase 71 38 - 126 U/L   Total Bilirubin 0.8 0.3 - 1.2 mg/dL   GFR, Estimated 46 (L) >60 mL/min    Comment: (NOTE) Calculated using the CKD-EPI Creatinine Equation (2021)    Anion gap 7 5 - 15    Comment: Performed at [facility], [address] [place], [place] [address]  CBC     Status: None   Collection Time: 09/30/21  2:02 PM  Result Value Ref Range   WBC 9.5 4.0 - 10.5 K/uL   RBC 4.89 4.22 - 5.81 MIL/uL   Hemoglobin 14.3 13.0 - 17.0 g/dL   HCT 43.6 39.0 - 52.0 %   MCV 89.2 80.0 - 100.0 fL   MCH 29.2 26.0 - 34.0 pg   MCHC 32.8 30.0 - 36.0 g/dL   RDW 12.6 11.5 - 15.5 %   Platelets 338 150 - 400 K/uL   nRBC 0.0 0.0 - 0.2 %    Comment: Performed at [facility], [address] [place], [place] [address]  Urinalysis, Routine w reflex microscopic Urine, Clean Catch     Status: Abnormal   Collection Time: 09/30/21  3:45 PM  Result Value Ref Range   Color, Urine YELLOW (A) YELLOW   APPearance HAZY (A) CLEAR   Specific Gravity, Urine 1.009 1.005 - 1.030   pH 6.0 5.0 - 8.0   Glucose, UA 50 (A) NEGATIVE mg/dL   Hgb urine dipstick MODERATE (A) NEGATIVE   Bilirubin Urine NEGATIVE NEGATIVE   Ketones, ur NEGATIVE NEGATIVE mg/dL   Protein, ur >=300 (A) NEGATIVE mg/dL   Nitrite POSITIVE (A) NEGATIVE   Leukocytes,Ua SMALL (A) NEGATIVE   RBC / HPF 0-5 0 - 5 RBC/hpf   WBC, UA 11-20 0 - 5 WBC/hpf   Bacteria, UA FEW (A) NONE SEEN   Squamous Epithelial / LPF NONE SEEN 0 - 5   Mucus PRESENT    Amorphous Crystal PRESENT     Comment: Performed at [facility] [address] [place], [place] [address]  Urine Culture     Status: Abnormal   Collection Time: 09/30/21  3:45 PM   Specimen: Urine, Catheterized  Result Value Ref Range   Specimen Description      URINE, CATHETERIZED Performed at [facility], [address], [place], [place] [address]    Special Requests      NONE Performed at  [facility], [address], [place], [place] [address]    Culture (A)     >=  100,000 COLONIES/mL ENTEROCOCCUS FAECALIS 20,000 COLONIES/mL PSEUDOMONAS AERUGINOSA    Report Status 10/02/2021 FINAL    Organism ID, Bacteria PSEUDOMONAS AERUGINOSA (A)    Organism ID, Bacteria ENTEROCOCCUS FAECALIS (A)       Susceptibility   Enterococcus faecalis - MIC*    AMPICILLIN <=2 SENSITIVE Sensitive     NITROFURANTOIN <=16 SENSITIVE Sensitive     VANCOMYCIN 1 SENSITIVE Sensitive     * >=100,000 COLONIES/mL ENTEROCOCCUS FAECALIS   Pseudomonas aeruginosa - MIC*    CEFTAZIDIME 8 SENSITIVE Sensitive     CIPROFLOXACIN 1 INTERMEDIATE Intermediate     GENTAMICIN <=1 SENSITIVE Sensitive     IMIPENEM 2 SENSITIVE Sensitive     * 20,000 COLONIES/mL PSEUDOMONAS AERUGINOSA  CBC with Differential     Status: Abnormal   Collection Time: 10/09/21  2:39 PM  Result Value Ref Range   WBC 11.8 (H) 4.0 - 10.5 K/uL   RBC 4.81 4.22 - 5.81 MIL/uL   Hemoglobin 14.2 13.0 - 17.0 g/dL   HCT 42.9 39.0 - 52.0 %   MCV 89.2 80.0 - 100.0 fL   MCH 29.5 26.0 - 34.0 pg   MCHC 33.1 30.0 - 36.0 g/dL   RDW 12.5 11.5 - 15.5 %   Platelets 289 150 - 400 K/uL   nRBC 0.0 0.0 - 0.2 %   Neutrophils Relative % 78 %   Neutro Abs 9.4 (H) 1.7 - 7.7 K/uL   Lymphocytes Relative 10 %   Lymphs Abs 1.2 0.7 - 4.0 K/uL   Monocytes Relative 9 %   Monocytes Absolute 1.0 0.1 - 1.0 K/uL   Eosinophils Relative 1 %   Eosinophils Absolute 0.1 0.0 - 0.5 K/uL   Basophils Relative 1 %   Basophils Absolute 0.1 0.0 - 0.1 K/uL   Immature Granulocytes 1 %   Abs Immature Granulocytes 0.06 0.00 - 0.07 K/uL    Comment: Performed at [facility], [address] [place], [place] [address]  Comprehensive metabolic panel     Status: Abnormal   Collection Time: 10/09/21  2:39 PM  Result Value Ref Range   Sodium 133 (L) 135 - 145 mmol/L   Potassium 4.5 3.5 - 5.1 mmol/L   Chloride 98 98 - 111 mmol/L   CO2 26 22 - 32 mmol/L   Glucose,  Bld 262 (H) 70 - 99 mg/dL    Comment: Glucose reference range applies only to samples taken after fasting for at least 8 hours.   BUN 21 8 - 23 mg/dL   Creatinine, Ser 1.52 (H) 0.61 - 1.24 mg/dL   Calcium 9.9 8.9 - 10.3 mg/dL   Total Protein 7.9 6.5 - 8.1 g/dL   Albumin 3.7 3.5 - 5.0 g/dL   AST 17 15 - 41 U/L   ALT 16 0 - 44 U/L   Alkaline Phosphatase 59 38 - 126 U/L   Total Bilirubin 0.8 0.3 - 1.2 mg/dL   GFR, Estimated 46 (L) >60 mL/min    Comment: (NOTE) Calculated using the CKD-EPI Creatinine Equation (2021)    Anion gap 9 5 - 15    Comment: Performed at [facility], [address] [place], [place] [address]  Urinalysis, Routine w reflex microscopic     Status: Abnormal   Collection Time: 10/09/21  2:39 PM  Result Value Ref Range   Color, Urine AMBER (A) YELLOW    Comment: BIOCHEMICALS MAY BE AFFECTED BY COLOR   APPearance TURBID (A) CLEAR   Specific Gravity, Urine 1.012 1.005 -  1.030   pH 6.0 5.0 - 8.0   Glucose, UA 150 (A) NEGATIVE mg/dL   Hgb urine dipstick LARGE (A) NEGATIVE   Bilirubin Urine NEGATIVE NEGATIVE   Ketones, ur NEGATIVE NEGATIVE mg/dL   Protein, ur 100 (A) NEGATIVE mg/dL   Nitrite NEGATIVE NEGATIVE   Leukocytes,Ua LARGE (A) NEGATIVE   RBC / HPF >50 (H) 0 - 5 RBC/hpf   WBC, UA >50 (H) 0 - 5 WBC/hpf   Bacteria, UA MANY (A) NONE SEEN   Squamous Epithelial / LPF NONE SEEN 0 - 5   WBC Clumps PRESENT     Comment: Performed at [facility], [address], [place], [place] [address]  Urine Culture     Status: Abnormal   Collection Time: 10/09/21  2:43 PM   Specimen: Urine, Random  Result Value Ref Range   Specimen Description      URINE, RANDOM Performed at [facility], [address] [place], [place] [address]    Special Requests      NONE Performed at [facility], [place], [place] [address]    Culture (A)     >=100,000 COLONIES/mL PSEUDOMONAS AERUGINOSA >=100,000 COLONIES/mL ENTEROCOCCUS  FAECALIS    Report Status 10/12/2021 FINAL    Organism ID, Bacteria PSEUDOMONAS AERUGINOSA (A)    Organism ID, Bacteria ENTEROCOCCUS FAECALIS (A)       Susceptibility   Enterococcus faecalis - MIC*    AMPICILLIN <=2 SENSITIVE Sensitive     NITROFURANTOIN <=16 SENSITIVE Sensitive     VANCOMYCIN 1 SENSITIVE Sensitive     * >=100,000 COLONIES/mL ENTEROCOCCUS FAECALIS   Pseudomonas aeruginosa - MIC*    CEFTAZIDIME 2 SENSITIVE Sensitive     CIPROFLOXACIN 0.5 SENSITIVE Sensitive     GENTAMICIN <=1 SENSITIVE Sensitive     IMIPENEM 2 SENSITIVE Sensitive     PIP/TAZO 8 SENSITIVE Sensitive     CEFEPIME 2 SENSITIVE Sensitive     * >=100,000 COLONIES/mL PSEUDOMONAS AERUGINOSA  CBC with Differential     Status: Abnormal   Collection Time: 10/11/21  8:46 PM  Result Value Ref Range   WBC 11.5 (H) 4.0 - 10.5 K/uL   RBC 4.91 4.22 - 5.81 MIL/uL   Hemoglobin 14.3 13.0 - 17.0 g/dL   HCT 44.2 39.0 - 52.0 %   MCV 90.0 80.0 - 100.0 fL   MCH 29.1 26.0 - 34.0 pg   MCHC 32.4 30.0 - 36.0 g/dL   RDW 12.9 11.5 - 15.5 %   Platelets 299 150 - 400 K/uL   nRBC 0.0 0.0 - 0.2 %   Neutrophils Relative % 72 %   Neutro Abs 8.4 (H) 1.7 - 7.7 K/uL   Lymphocytes Relative 16 %   Lymphs Abs 1.8 0.7 - 4.0 K/uL   Monocytes Relative 8 %   Monocytes Absolute 1.0 0.1 - 1.0 K/uL   Eosinophils Relative 2 %   Eosinophils Absolute 0.2 0.0 - 0.5 K/uL   Basophils Relative 1 %   Basophils Absolute 0.1 0.0 - 0.1 K/uL   Immature Granulocytes 1 %   Abs Immature Granulocytes 0.06 0.00 - 0.07 K/uL    Comment: Performed at [facility], [address] [place], [place] [address]  Comprehensive metabolic panel     Status: Abnormal   Collection Time: 10/11/21  8:46 PM  Result Value Ref Range   Sodium 133 (L) 135 - 145 mmol/L   Potassium 4.3 3.5 - 5.1 mmol/L   Chloride 99 98 -  111 mmol/L   CO2 26 22 - 32 mmol/L   Glucose, Bld 230 (H) 70 - 99 mg/dL    Comment: Glucose reference range applies only to samples taken  after fasting for at least 8 hours.   BUN 20 8 - 23 mg/dL   Creatinine, Ser 1.64 (H) 0.61 - 1.24 mg/dL   Calcium 9.8 8.9 - 10.3 mg/dL   Total Protein 7.9 6.5 - 8.1 g/dL   Albumin 3.4 (L) 3.5 - 5.0 g/dL   AST 17 15 - 41 U/L   ALT 14 0 - 44 U/L   Alkaline Phosphatase 53 38 - 126 U/L   Total Bilirubin 0.8 0.3 - 1.2 mg/dL   GFR, Estimated 42 (L) >60 mL/min    Comment: (NOTE) Calculated using the CKD-EPI Creatinine Equation (2021)    Anion gap 8 5 - 15    Comment: Performed at [facility], [address] [place], [place] [address]  Glucose, capillary     Status: Abnormal   Collection Time: 10/12/21  8:37 AM  Result Value Ref Range   Glucose-Capillary 141 (H) 70 - 99 mg/dL    Comment: Glucose reference range applies only to samples taken after fasting for at least 8 hours.  Glucose, capillary     Status: Abnormal   Collection Time: 10/12/21 11:56 AM  Result Value Ref Range   Glucose-Capillary 178 (H) 70 - 99 mg/dL    Comment: Glucose reference range applies only to samples taken after fasting for at least 8 hours.  Glucose, capillary     Status: Abnormal   Collection Time: 10/12/21  5:30 PM  Result Value Ref Range   Glucose-Capillary 171 (H) 70 - 99 mg/dL    Comment: Glucose reference range applies only to samples taken after fasting for at least 8 hours.  Glucose, capillary     Status: Abnormal   Collection Time: 10/12/21  9:11 PM  Result Value Ref Range   Glucose-Capillary 206 (H) 70 - 99 mg/dL    Comment: Glucose reference range applies only to samples taken after fasting for at least 8 hours.  Glucose, capillary     Status: Abnormal   Collection Time: 10/12/21 10:28 PM  Result Value Ref Range   Glucose-Capillary 178 (H) 70 - 99 mg/dL    Comment: Glucose reference range applies only to samples taken after fasting for at least 8 hours.  APTT     Status: Abnormal   Collection Time: 10/12/21 11:02 PM  Result Value Ref Range   aPTT 92 (H) 24 - 36 seconds    Comment:         IF BASELINE aPTT IS ELEVATED, SUGGEST PATIENT RISK ASSESSMENT BE USED TO DETERMINE APPROPRIATE ANTICOAGULANT THERAPY. Performed at [facility], [place], [place] [address]   Heparin level (unfractionated)     Status: Abnormal   Collection Time: 10/12/21 11:02 PM  Result Value Ref Range   Heparin Unfractionated >1.10 (H) 0.30 - 0.70 IU/mL    Comment: (NOTE) The clinical reportable range upper limit is being lowered to >1.10 to align with the FDA approved guidance for the current laboratory assay.  If heparin results are below expected values, and patient dosage has  been confirmed, suggest follow up testing of antithrombin III levels. Performed at [facility], [address], [place], [place] [address]   Basic metabolic panel     Status: Abnormal   Collection Time: 10/13/21  3:56 AM  Result Value Ref Range  Sodium 135 135 - 145 mmol/L   Potassium 4.3 3.5 - 5.1 mmol/L   Chloride 101 98 - 111 mmol/L   CO2 29 22 - 32 mmol/L   Glucose, Bld 189 (H) 70 - 99 mg/dL    Comment: Glucose reference range applies only to samples taken after fasting for at least 8 hours.   BUN 29 (H) 8 - 23 mg/dL   Creatinine, Ser 1.85 (H) 0.61 - 1.24 mg/dL   Calcium 9.2 8.9 - 10.3 mg/dL   GFR, Estimated 36 (L) >60 mL/min    Comment: (NOTE) Calculated using the CKD-EPI Creatinine Equation (2021)    Anion gap 5 5 - 15    Comment: Performed at [facility], [address] [place], [place] [address]  CBC     Status: Abnormal   Collection Time: 10/13/21  3:56 AM  Result Value Ref Range   WBC 6.0 4.0 - 10.5 K/uL   RBC 3.35 (L) 4.22 - 5.81 MIL/uL   Hemoglobin 10.0 (L) 13.0 - 17.0 g/dL    Comment: REPEATED TO VERIFY   HCT 30.3 (L) 39.0 - 52.0 %   MCV 90.4 80.0 - 100.0 fL   MCH 29.9 26.0 - 34.0 pg   MCHC 33.0 30.0 - 36.0 g/dL   RDW 13.0 11.5 - 15.5 %   Platelets 230 150 - 400 K/uL   nRBC 0.0 0.0 - 0.2 %    Comment: Performed at [facility], [address] [place], [place] [address]  APTT     Status: Abnormal   Collection Time: 10/13/21  7:34 AM  Result Value Ref Range   aPTT 99 (H) 24 - 36 seconds    Comment:        IF BASELINE aPTT IS ELEVATED, SUGGEST PATIENT RISK ASSESSMENT BE USED TO DETERMINE APPROPRIATE ANTICOAGULANT THERAPY. Performed at [facility], [address] [place], [place] [address]   Glucose, capillary     Status: Abnormal   Collection Time: 10/13/21  7:42 AM  Result Value Ref Range   Glucose-Capillary 196 (H) 70 - 99 mg/dL    Comment: Glucose reference range applies only to samples taken after fasting for at least 8 hours.     PHQ2/9:    09/01/2021   11:39 AM 07/08/2021    1:28 PM 10/16/2020   10:47 AM 09/02/2020    8:57 AM 08/30/2020    1:05 PM  Depression screen PHQ 2/9  Decreased Interest 1 0 0 0 0  Down, Depressed, Hopeless 1 0 0 0 0  PHQ - 2 Score 2 0 0 0 0  Altered sleeping 0 0     Tired, decreased energy 0 0     Change in appetite 0 0     Feeling bad or failure about yourself  0 0     Trouble concentrating 0 0     Moving slowly or fidgety/restless 0 0     Suicidal thoughts  0     PHQ-9 Score 2 0     Difficult doing work/chores Not difficult at all          Fall Risk:    09/01/2021   11:36 AM 07/08/2021    1:27 PM 10/16/2020   10:46 AM 09/02/2020    8:56 AM 08/30/2020    1:05 PM  Fall Risk   Falls in the past year? 1 1 0 1 1  Comment     loses balance  Number falls in past yr: 1  1 0 1 1  Injury with Fall? 1 0 0 0 0  Risk for fall due to :  No Fall Risks No Fall Risks Impaired balance/gait Impaired balance/gait;Impaired mobility;Medication side effect  Follow up Falls evaluation completed;Education provided;Falls prevention discussed Falls evaluation completed Falls evaluation completed Falls evaluation completed;Education provided;Falls prevention discussed Falls evaluation completed;Education provided;Falls prevention discussed      Functional Status  Survey:      Assessment & Plan

## 2021-10-13 NOTE — Inpatient Diabetes Management (Signed)
Inpatient Diabetes Program Recommendations  AACE/ADA: New Consensus Statement on Inpatient Glycemic Control (2015)  Target Ranges:  Prepandial:   less than 140 mg/dL      Peak postprandial:   less than 180 mg/dL (1-2 hours)      Critically ill patients:  140 - 180 mg/dL    Latest Reference Range & Units 10/13/21 07:42 10/13/21 11:36  Glucose-Capillary 70 - 99 mg/dL 196 (H) 214 (H)  (H): Data is abnormally high     Home DM Meds: Semglee 10 units daily     Metformin 1000 mg BID    Current Orders: Novolog 0-15 units TID ac/hs     MD- Note pt takes Semglee insulin at home.  CBGs 196-214 today.  Please consider restarting 50% home dose Semglee 5 units Daily    --Will follow patient during hospitalization--  Wyn Quaker RN, MSN, [place] Diabetes Coordinator Inpatient Glycemic Control Team Team Pager: [phone] (8a-5p)

## 2021-10-13 NOTE — Progress Notes (Signed)
Urology Inpatient Progress Note  Subjective: No acute events overnight.  He is afebrile, VSS. Urine culture has finalized with Pseudomonas aeruginosa and Enterococcus faecalis consistent with prior.  On antibiotics as below. Foley catheter in place draining clear urine on slow drip CBI. He denies pain today.  Anti-infectives: Anti-infectives (From admission, onward)    Start     Dose/Rate Route Frequency Ordered Stop   10/12/21 1400  amoxicillin (AMOXIL) capsule 500 mg        500 mg Oral Every 8 hours 10/12/21 1301     10/12/21 1100  ciprofloxacin (CIPRO) tablet 500 mg        500 mg Oral 2 times daily 10/12/21 1004         Current Facility-Administered Medications  Medication Dose Route Frequency Provider Last Rate Last Admin   acetaminophen (TYLENOL) tablet 650 mg  650 mg Oral Q6H PRN Athena Masse, MD       Or   acetaminophen (TYLENOL) suppository 650 mg  650 mg Rectal Q6H PRN Athena Masse, MD       amoxicillin (AMOXIL) capsule 500 mg  500 mg Oral Q8H Wouk, Ailene Rud, MD   500 mg at 10/13/21 0651   carvedilol (COREG) tablet 25 mg  25 mg Oral BID WC Athena Masse, MD   25 mg at 10/12/21 1732   Chlorhexidine Gluconate Cloth 2 % PADS 6 each  6 each Topical Daily Wouk, Ailene Rud, MD       ciprofloxacin (CIPRO) tablet 500 mg  500 mg Oral BID Gwynne Edinger, MD   500 mg at 10/12/21 2004   cloNIDine (CATAPRES) tablet 0.1 mg  0.1 mg Oral BID Gwynne Edinger, MD   0.1 mg at 10/12/21 2220   heparin ADULT infusion 100 units/mL (25000 units/283m)  1,900 Units/hr Intravenous Continuous GDallie Piles RPH 19 mL/hr at 10/13/21 0344 1,900 Units/hr at 10/13/21 0344   insulin aspart (novoLOG) injection 0-15 Units  0-15 Units Subcutaneous TID WC DAthena Masse MD   3 Units at 10/12/21 1736   insulin aspart (novoLOG) injection 0-5 Units  0-5 Units Subcutaneous QHS DAthena Masse MD       lidocaine (XYLOCAINE) 2 % jelly 1 Application  1 Application Urethral Once RMerlyn Lot MD       mirabegron ER ([facility] tablet 50 mg  50 mg Oral Daily DAthena Masse MD   50 mg at 10/12/21 0947   mometasone-formoterol (DULERA) 100-5 MCG/ACT inhaler 2 puff  2 puff Inhalation BID MNoralee Space RPH   2 puff at 10/12/21 2006   And   umeclidinium bromide (INCRUSE ELLIPTA) 62.5 MCG/ACT 1 puff  1 puff Inhalation Daily MChinita GreenlandA, RPH   1 puff at 10/12/21 0900   morphine (PF) 4 MG/ML injection 4 mg  4 mg Intravenous Q3H PRN RMerlyn Lot MD       nortriptyline (PAMELOR) capsule 10 mg  10 mg Oral BID WGwynne Edinger MD   10 mg at 10/12/21 1732   ondansetron (ZOFRAN) injection 4 mg  4 mg Intravenous Once RMerlyn Lot MD       ondansetron ([facility] tablet 4 mg  4 mg Oral Q6H PRN DAthena Masse MD       Or   ondansetron ([facility] injection 4 mg  4 mg Intravenous Q6H PRN DAthena Masse MD       oxybutynin (DITROPAN) tablet 2.5 mg  2.5 mg Oral Daily Wouk, NAilene Rud MD  2.5 mg at 10/12/21 1732   rosuvastatin (CRESTOR) tablet 40 mg  40 mg Oral QHS Judd Gaudier V, MD   40 mg at 10/12/21 2220   sodium chloride irrigation 0.9 % 3,000 mL  3,000 mL Irrigation Continuous Delman Kitten, MD   3,000 mL at 10/13/21 0559   sodium chloride irrigation 0.9 % 3,000 mL  3,000 mL Irrigation Continuous Delman Kitten, MD   3,000 mL at 10/12/21 0654   Objective: Vital signs in last 24 hours: Temp:  [97.7 F (36.5 C)-98 F (36.7 C)] 97.7 F (36.5 C) (07/24 0743) Pulse Rate:  [52-58] 53 (07/24 0743) Resp:  [16-18] 16 (07/24 0743) BP: (102-159)/(49-70) 138/70 (07/24 0743) SpO2:  [96 %-98 %] 97 % (07/24 0743)  Intake/Output from previous day: 07/23 0701 - 07/24 0700 In: 48.2 [I.V.:48.2] Out: 3299 [Urine:5775] Intake/Output this shift: No intake/output data recorded.  Physical Exam Vitals and nursing note reviewed.  Constitutional:      General: He is not in acute distress.    Appearance: He is not ill-appearing, toxic-appearing or diaphoretic.  HENT:      Head: Normocephalic and atraumatic.  Pulmonary:     Effort: Pulmonary effort is normal. No respiratory distress.  Skin:    General: Skin is warm and dry.  Neurological:     Mental Status: He is alert and oriented to person, place, and time.  Psychiatric:        Mood and Affect: Mood normal.        Behavior: Behavior normal.    Lab Results:  Recent Labs    10/11/21 2046 10/13/21 0356  WBC 11.5* 6.0  HGB 14.3 10.0*  HCT 44.2 30.3*  PLT 299 230   BMET Recent Labs    10/11/21 2046 10/13/21 0356  NA 133* 135  K 4.3 4.3  CL 99 101  CO2 26 29  GLUCOSE 230* 189*  BUN 20 29*  CREATININE 1.64* 1.85*  CALCIUM 9.8 9.2   Assessment & Plan: 81 year old extremely comorbid male with recurrent UTIs, gross hematuria, and urinary retention managed with indwelling Foley catheter newly on anticoagulation for PE admitted with gross hematuria now on CBI.  Dr. Diamantina Providence previously recommended deferring HOLEP at least 6 months following his PE due to the need for anticoagulation.  He recommended consideration of PAE as an alternative.  I discussed this with the patient in clinic on 10/06/2021, and he wished to pursue PAE at that time.  Referral is currently pending.  I clamped his CBI this morning.  We will plan to keep it off and recheck his urine at midday.  If his effluent remains light pink or clear in color, okay to discontinue CBI but keep Foley catheter in place.  Agree with 7 to 10 days of culture appropriate antibiotics as above.  Debroah Loop, PA-C 10/13/2021

## 2021-10-13 NOTE — Discharge Summary (Signed)
Justin Griffith ZMO:[id number] DOB: [birth year] DOA: 10/11/2021  PCP: [name]  Admit date: 10/11/2021 Discharge date: 10/13/2021  Time spent: 35 minutes  Recommendations for Outpatient Follow-up:  Urology f/u 1 month  Pcp hospital f/u, monitor kidney function    Discharge Diagnoses:  Principal Problem:   Gross hematuria Active Problems:   History of pulmonary embolism 09/18/21   Chronic anticoagulation   Essential hypertension   Type 2 diabetes mellitus without complications ([place])   Chronic obstructive pulmonary disease (COPD) ([place])   BPH with obstruction/lower urinary tract symptoms Foley dependent   Morbid obesity ([place])   PAD (peripheral artery disease) ([place])   Coronary artery disease   History of CVA (cerebrovascular accident)   Acute renal failure ([place])   Stage 3b chronic kidney disease (CKD) ([place])   Discharge Condition: stable  Diet recommendation: heart healthy  Filed Weights   10/11/21 2024  Weight: 115 kg    History of present illness:  From admission h and p Justin Griffith is a 81 y.o. male with medical history significant for Insulin-dependent type 2 diabetes, BPH with LUTS  and awaiting HoLEP, COPD, HTN, recently hospitalized from 6/30 to 7/2 with an acute PE now on Eliquis thus delaying his HoLEP procedure, who presents to the ED with suprapubic pain.  Patient had been seen a few times in the ED over the past few days due to hematuria and bladder spasms.  He followed up with urology on 7/18 and  was prescribed mirabegron for his bladder spasms.  He was also recently treated for E faecalis UTI on culture on 7/11 sensitive to Cipro.  He denies fevers or chills.  Hospital Course:  # Gross hematuria # BPH # Chronic indwelling foley. Has chronic indwelling foley which was exchanged in ED on 7/22 after presenting with suprapubic pressure, foley not draining well, and gross hematuria that began 7/20. Hgb wnl. Has had foley for several months, eventual  plan for HOLEP but that's now on hold given new PE. Treated with CBI, hematuria resolved. Patient ambulated well with nursing prior to discharge. - urology f/u 1 month - continuing anticoagulation for now   # Hx PE Diagnosed 09/18/21, treated with eliquis. Respiratory status stable, no O2 requirement. Here transitioned to IV heparin, will resume eliquis at discharge   # Acute cystitis Suprapubic pain and burning, seen in ED on 7/20, culture growing pan-sensitive pseudomonas and now also e faecalis - continue cipro, add oral amox, treat for 7 days   # HTN Here hypertensive, improved with home meds  # HFpEF Mild LE edema, breathing comfortably - home meds continued   # T2DM Here glucose elevated, advised compliance w/ home insulin which he takes sporadically   # CAD Hx CABG. Asymptomatic - home statin, anticoagulant   # AKI? # CKD stage 3 b Baseline cr was close to 1 earlier this year. Worsened to 2s recent hospitalization. Here gfr in the 30-40s.  - outpt monitoring   Procedures: none  Consultations: urology  Discharge Exam: Vitals:   10/13/21 0743 10/13/21 1137  BP: 138/70 137/72  Pulse: (!) 53 (!) 54  Resp: 16   Temp: 97.7 F (36.5 C) 98.4 F (36.9 C)  SpO2: 97% 97%    General exam: Appears calm and comfortable  Respiratory system: Clear to auscultation. Respiratory effort normal. Cardiovascular system: S1 & S2 heard, RRR. Soft systolic murmur Gastrointestinal system: Abdomen is nondistended, soft and with mild tenderness suprapubically. No organomegaly or masses felt. Normal bowel sounds  heard. Central nervous system: Alert and oriented. No focal neurological deficits. Hard of hearing Extremities: Symmetric 5 x 5 power. Skin: edema LEs Psychiatry: Judgement and insight appear normal. Mood & affect appropriate.   Discharge Instructions   Discharge Instructions     Diet - low sodium heart healthy   Complete by: As directed    Increase activity slowly    Complete by: As directed    No wound care   Complete by: As directed       Allergies as of 10/13/2021       Reactions   Farxiga [dapagliflozin] Rash   Causes a severe rash in groin area   Dulaglutide Diarrhea, Other (See Comments)   Liraglutide Diarrhea, Other (See Comments)   Jardiance [empagliflozin] Rash        Medication List     STOP taking these medications    aspirin EC 81 MG tablet   fosfomycin 3 g Pack Commonly known as: MONUROL   nitrofurantoin (macrocrystal-monohydrate) 100 MG capsule Commonly known as: MACROBID   oxyCODONE-acetaminophen 5-325 MG tablet Commonly known as: PERCOCET/ROXICET       TAKE these medications    amoxicillin 500 MG capsule Commonly known as: AMOXIL Take 1 capsule (500 mg total) by mouth every 8 (eight) hours.   apixaban 5 MG Tabs tablet Commonly known as: Eliquis Take 1 tablet (5 mg total) by mouth 2 (two) times daily. Start taking on: October 14, 2021   Breztri Aerosphere 160-9-4.8 MCG/ACT Aero Generic drug: Budeson-Glycopyrrol-Formoterol Inhale 1 puff into the lungs daily.   carvedilol 25 MG tablet Commonly known as: COREG TAKE ONE TABLET BY MOUTH TWO TIMES A DAY FOR BLOOD PRESSURE   ciprofloxacin 500 MG tablet Commonly known as: Cipro Take 1 tablet (500 mg total) by mouth 2 (two) times daily.   cloNIDine 0.1 MG tablet Commonly known as: CATAPRES Take 0.1 mg by mouth 2 (two) times daily.   fenofibrate 145 MG tablet Commonly known as: Tricor Take 1 tablet (145 mg total) by mouth daily.   hydrALAZINE 50 MG tablet Commonly known as: APRESOLINE TAKE ONE TABLET BY MOUTH THREE TIMES A DAY FOR BLOOD PRESSURE   insulin glargine-yfgn 100 UNIT/ML Pen Commonly known as: SEMGLEE INJECT 10 UNITS UNDER SKIN ONCE EVERY DAY FOR DIABETES DISCARD PEN 28 DAYS AFTER OPENING. * GIVE AT THE SAME TIME DAILY *   losartan 100 MG tablet Commonly known as: COZAAR Take 100 mg by mouth daily.   magnesium oxide 400 (240 Mg) MG  tablet Commonly known as: MAG-OX Take 1 tablet (400 mg total) by mouth daily.   metFORMIN 1000 MG tablet Commonly known as: GLUCOPHAGE Take 1 tablet (1,000 mg total) by mouth 2 (two) times daily with a meal.   mirabegron ER 50 MG Tb24 tablet Commonly known as: MYRBETRIQ Take 1 tablet (50 mg total) by mouth daily.   nortriptyline 10 MG capsule Commonly known as: PAMELOR Take 10 mg by mouth 2 (two) times daily.   ondansetron 4 MG disintegrating tablet Commonly known as: ZOFRAN-ODT Take 1 tablet (4 mg total) by mouth every 8 (eight) hours as needed for up to 5 days.   Oxybutynin Chloride 2.5 MG Tabs Take 1 tablet by mouth daily.   rosuvastatin 40 MG tablet Commonly known as: CRESTOR Take 1 tablet (40 mg total) by mouth at bedtime.   Spiriva Respimat 2.5 MCG/ACT Aers Generic drug: Tiotropium Bromide Monohydrate Inhale 2 puffs into the lungs daily.   Torsemide 40 MG Tabs Take 80 mg  by mouth 2 (two) times daily.   vitamin B-12 500 MCG tablet Commonly known as: CYANOCOBALAMIN Take 2,000 mcg by mouth at bedtime.   Vitamin D (Cholecalciferol) 25 MCG (1000 UT) Caps Take 2,000 mcg by mouth at bedtime.       Allergies  Allergen Reactions   Farxiga [Dapagliflozin] Rash    Causes a severe rash in groin area    Dulaglutide Diarrhea and Other (See Comments)   Liraglutide Diarrhea and Other (See Comments)   Jardiance [Empagliflozin] Rash    Follow-up Information     Billey Co, MD Follow up.   Specialty: Urology Why: call to schedule follow-up in one month Contact information: [address] [place] [address] [phone]                  The results of significant diagnostics from this hospitalization (including imaging, microbiology, ancillary and laboratory) are listed below for reference.    Significant Diagnostic Studies: CT ABDOMEN PELVIS W CONTRAST  Result Date: 09/30/2021 CLINICAL DATA:  Constipation.  Abdominal pain. EXAM: CT  ABDOMEN AND PELVIS WITH CONTRAST TECHNIQUE: Multidetector CT imaging of the abdomen and pelvis was performed using the standard protocol following bolus administration of intravenous contrast. RADIATION DOSE REDUCTION: This exam was performed according to the departmental dose-optimization program which includes automated exposure control, adjustment of the mA and/or kV according to patient size and/or use of iterative reconstruction technique. CONTRAST:  120m OMNIPAQUE IOHEXOL 300 MG/ML  SOLN COMPARISON:  One-view abdomen 09/30/2021. CT of the abdomen and pelvis 09/19/2021. FINDINGS: Lower chest: Linear atelectasis present at the left lung base. 6 mm right lower lobe pulmonary nodule is stable since at least 2013. Recommend no follow-up. No other nodule or mass lesion is present. Heart is mildly enlarged. No significant pleural or pericardial effusion is present. Hepatobiliary: Diffuse fatty infiltration liver is present. No focal lesions are present. Common bile duct is within normal limits following cholecystectomy. Pancreas: Pancreatic atrophy is again noted. No discrete lesions are present. No inflammation. Spleen: Normal in size without focal abnormality. Adrenals/Urinary Tract: The adrenal glands are normal bilaterally. Some stranding is present about both kidneys. No stone or mass lesion is present. No obstruction is present. Ureters are within normal limits bilaterally. A Foley catheter present in the urinary bladder. The bladder is incompletely drained. Stomach/Bowel: The stomach and duodenum are within normal limits. Small bowel is unremarkable. Terminal ileum is within normal limits. The appendix is not discretely visualized and may be surgically absent. No inflammatory changes are present. The ascending and transverse colon are within normal limits. Descending and sigmoid colon are unremarkable. Anastomosis is intact. The rectosigmoid colon is normal. No significant stool burden or obstruction is  present. Vascular/Lymphatic: Atherosclerotic calcifications are present in the aorta and branch vessels. No aneurysm is present. Reproductive: Prostate is enlarged, measuring 7.9 cm in transverse diameter, stable. Other: No abdominal wall hernia or abnormality. No abdominopelvic ascites. Musculoskeletal: Multilevel degenerative changes in the lower lumbar spine are stable. Slight rightward curvature is present at L2-3 and leftward curvature at L4-5. Bony pelvis is within normal limits. Mild degenerative changes are present at the hips and SI joints bilaterally. IMPRESSION: 1. No acute or focal lesion to explain the patient's abdominal pain. 2. Hepatic steatosis. 3. Cholecystectomy. 4. Enlarged prostate gland. 5. Chronic indwelling urinary catheter. 6. Aortic Atherosclerosis (ICD10-I70.0). Electronically Signed   By: CSan MorelleM.D.   On: 09/30/2021 16:29   DG Abdomen 1 View  Result Date: 09/30/2021 CLINICAL  DATA:  Constipation shin abdominal pain, last bowel movement 1 week ago, no relief from over-the-counter laxatives andstool softeners EXAM: ABDOMEN - 1 VIEW COMPARISON:  None FINDINGS: Increased stool in RIGHT colon. Paucity of stool throughout remainder of colon. Catheter within urinary bladder. Nonobstructive bowel gas pattern without bowel dilatation or bowel wall thickening. Multilevel degenerative disc disease changes lumbar spine. IMPRESSION: Increased stool RIGHT colon. Electronically Signed   By: Lavonia Dana M.D.   On: 09/30/2021 14:37   ECHOCARDIOGRAM COMPLETE  Result Date: 09/20/2021    ECHOCARDIOGRAM REPORT   Patient Name:   MADEX SEALS [facility] Date of Exam: 09/20/2021 Medical Rec #:  [id number]      Height:       70.0 in Accession #:    [id number]     Weight:       266.0 lb Date of Birth:  1940-06-27      BSA:          2.356 m Patient Age:    70 years       BP:           138/68 mmHg Patient Gender: M              HR:           64 bpm. Exam Location:  [facility] Procedure: 2D Echo, Cardiac Doppler  and Color Doppler Indications:     I26.09  History:         Patient has prior history of Echocardiogram examinations, most                  recent 05/20/2020. CHF, CAD, Cardiac stents, PAD and COPD; Risk                  Factors:Former Smoker, Hypertension, Diabetes, Dyslipidemia and                  Sleep Apnea.  Sonographer:     Rosalia Hammers Referring Phys:  [id number] [place] Diagnosing Phys: Isaias Cowman MD  Sonographer Comments: Technically challenging study due to limited acoustic windows. Image acquisition challenging due to patient body habitus, Image acquisition challenging due to COPD and Image acquisition challenging due to uncooperative patient. IMPRESSIONS  1. Left ventricular ejection fraction, by estimation, is 60 to 65%. The left ventricle has normal function. The left ventricle has no regional wall motion abnormalities. Left ventricular diastolic parameters were normal.  2. Right ventricular systolic function is normal. The right ventricular size is normal.  3. The mitral valve is normal in structure. Mild mitral valve regurgitation. No evidence of mitral stenosis.  4. The aortic valve is normal in structure. Aortic valve regurgitation is not visualized. Mild aortic valve stenosis.  5. The inferior vena cava is normal in size with greater than 50% respiratory variability, suggesting right atrial pressure of 3 mmHg. FINDINGS  Left Ventricle: Left ventricular ejection fraction, by estimation, is 60 to 65%. The left ventricle has normal function. The left ventricle has no regional wall motion abnormalities. The left ventricular internal cavity size was normal in size. There is  no left ventricular hypertrophy. Left ventricular diastolic parameters were normal. Right Ventricle: The right ventricular size is normal. No increase in right ventricular wall thickness. Right ventricular systolic function is normal. Left Atrium: Left atrial size was normal in size. Right Atrium: Right atrial size was  normal in size. Pericardium: There is no evidence of pericardial effusion. Mitral Valve: The mitral valve is normal in structure. Mild mitral  valve regurgitation. No evidence of mitral valve stenosis. Tricuspid Valve: The tricuspid valve is normal in structure. Tricuspid valve regurgitation is mild . No evidence of tricuspid stenosis. Aortic Valve: The aortic valve is normal in structure. Aortic valve regurgitation is not visualized. Mild aortic stenosis is present. Aortic valve mean gradient measures 13.0 mmHg. Aortic valve peak gradient measures 22.3 mmHg. Aortic valve area, by VTI measures 1.56 cm. Pulmonic Valve: The pulmonic valve was normal in structure. Pulmonic valve regurgitation is not visualized. No evidence of pulmonic stenosis. Aorta: The aortic root is normal in size and structure. Venous: The inferior vena cava is normal in size with greater than 50% respiratory variability, suggesting right atrial pressure of 3 mmHg. IAS/Shunts: No atrial level shunt detected by color flow Doppler.  LEFT VENTRICLE PLAX 2D LVIDd:         4.05 cm   Diastology LVIDs:         2.48 cm   LV e' medial:    6.31 cm/s LV PW:         1.57 cm   LV E/e' medial:  17.0 LV IVS:        1.72 cm   LV e' lateral:   7.40 cm/s LVOT diam:     1.90 cm   LV E/e' lateral: 14.5 LV SV:         79 LV SV Index:   33 LVOT Area:     2.84 cm  RIGHT VENTRICLE RV Basal diam:  3.90 cm RV S prime:     16.30 cm/s LEFT ATRIUM              Index        RIGHT ATRIUM           Index LA diam:        3.60 cm  1.53 cm/m   RA Area:     20.60 cm LA Vol (A2C):   80.1 ml  33.99 ml/m  RA Volume:   48.10 ml  20.41 ml/m LA Vol (A4C):   111.0 ml 47.11 ml/m LA Biplane Vol: 96.5 ml  40.95 ml/m  AORTIC VALVE AV Area (Vmax):    1.48 cm AV Area (Vmean):   1.47 cm AV Area (VTI):     1.56 cm AV Vmax:           236.00 cm/s AV Vmean:          171.000 cm/s AV VTI:            0.502 m AV Peak Grad:      22.3 mmHg AV Mean Grad:      13.0 mmHg LVOT Vmax:         123.00  cm/s LVOT Vmean:        88.600 cm/s LVOT VTI:          0.277 m LVOT/AV VTI ratio: 0.55  AORTA Ao Root diam: 3.60 cm MITRAL VALVE MV Area (PHT): 2.63 cm     SHUNTS MV Decel Time: 288 msec     Systemic VTI:  0.28 m MV E velocity: 107.00 cm/s  Systemic Diam: 1.90 cm MV A velocity: 101.00 cm/s MV E/A ratio:  1.06 Isaias Cowman MD Electronically signed by Isaias Cowman MD Signature Date/Time: 09/20/2021/1:32:01 PM    Final    US Venous Img Lower Bilateral (DVT)  Result Date: 09/19/2021 CLINICAL DATA:  Left lower extremity pain and edema. EXAM: BILATERAL LOWER EXTREMITY VENOUS DOPPLER ULTRASOUND TECHNIQUE: Gray-scale sonography with graded compression,  as well as color Doppler and duplex ultrasound were performed to evaluate the lower extremity deep venous systems from the level of the common femoral vein and including the common femoral, femoral, profunda femoral, popliteal and calf veins including the posterior tibial, peroneal and gastrocnemius veins when visible. The superficial great saphenous vein was also interrogated. Spectral Doppler was utilized to evaluate flow at rest and with distal augmentation maneuvers in the common femoral, femoral and popliteal veins. COMPARISON:  December 15, 2019 FINDINGS: RIGHT LOWER EXTREMITY Common Femoral Vein: No evidence of thrombus. Normal compressibility, respiratory phasicity and response to augmentation. Saphenofemoral Junction: No evidence of thrombus. Normal compressibility and flow on color Doppler imaging. Profunda Femoral Vein: No evidence of thrombus. Normal compressibility and flow on color Doppler imaging. Femoral Vein: No evidence of thrombus. Normal compressibility, respiratory phasicity and response to augmentation. Popliteal Vein: No evidence of thrombus. Normal compressibility, respiratory phasicity and response to augmentation. Calf Veins: No evidence of thrombus (only 1 peroneal vein is visualized on the RIGHT, as per the ultrasound  technologist. Normal compressibility and flow on color Doppler imaging. Superficial Great Saphenous Vein: No evidence of thrombus. Normal compressibility. Venous Reflux:  None. Other Findings:  None. LEFT LOWER EXTREMITY Common Femoral Vein: No evidence of thrombus. Normal compressibility, respiratory phasicity and response to augmentation. Saphenofemoral Junction: No evidence of thrombus. Normal compressibility and flow on color Doppler imaging. Profunda Femoral Vein: No evidence of thrombus. Normal compressibility and flow on color Doppler imaging. Femoral Vein: No evidence of thrombus. Normal compressibility, respiratory phasicity and response to augmentation. Popliteal Vein: No evidence of thrombus. Normal compressibility, respiratory phasicity and response to augmentation. Calf Veins: No evidence of thrombus. Normal compressibility and flow on color Doppler imaging. Superficial Great Saphenous Vein: No evidence of thrombus. Normal compressibility. Venous Reflux:  None. Other Findings:  None. IMPRESSION: No evidence of deep venous thrombosis in either lower extremity. Electronically Signed   By: Virgina Norfolk M.D.   On: 09/19/2021 21:37   CT Angio Chest PE W and/or Wo Contrast  Result Date: 09/19/2021 CLINICAL DATA:  per ems pt was found down in his driveway, states that he has probably been down since noon, pt has abrasions on bilat knees and a red rash on his abd. Pt arouses to verbal stim, pt states that he feel generally poorly, pt oriented to person, place and day of the week. EXAM: CT ANGIOGRAPHY CHEST CT ABDOMEN AND PELVIS WITH CONTRAST TECHNIQUE: Multidetector CT imaging of the chest was performed using the standard protocol during bolus administration of intravenous contrast. Multiplanar CT image reconstructions and MIPs were obtained to evaluate the vascular anatomy. Multidetector CT imaging of the abdomen and pelvis was performed using the standard protocol during bolus administration of  intravenous contrast. RADIATION DOSE REDUCTION: This exam was performed according to the departmental dose-optimization program which includes automated exposure control, adjustment of the mA and/or kV according to patient size and/or use of iterative reconstruction technique. CONTRAST:  63m OMNIPAQUE IOHEXOL 350 MG/ML SOLN COMPARISON:  03/29/2020, CT abdomen pelvis. Current chest radiograph. FINDINGS: CTA CHEST FINDINGS Cardiovascular: There are multiple bilateral pulmonary emboli. On the right, pulmonary emboli are noted extending from the upper lobe pulmonary artery into multiple segmental branches. They appear nonocclusive. There are segmental branch pulmonary emboli to the medial and lateral segments of the right middle lobe. Segmental pulmonary emboli are noted to all segments of the right lower lobe. On the left a nonocclusive embolus straddles the peripheral main pulmonary artery into the left upper  lobe anterior and apical segmental branches and left upper lobe lingular branch. There are nonocclusive pulmonary emboli to the lower lobe segmental branches. No large central pulmonary emboli, however. Pulmonary artery is dilated to 3.9 cm. RV LV ratio is 1.15, mildly elevated. Heart is top-normal in size. Three-vessel coronary artery calcifications. No pericardial effusion. Mild aortic atherosclerosis. No aneurysm or dissection. Mediastinum/Nodes: No neck base, mediastinal or hilar masses or enlarged lymph nodes. Trachea and esophagus are unremarkable. Lungs/Pleura: Subtle ground-glass opacities, most evident in the left upper lobe, likely due to vascular shunting. Linear atelectasis noted in the dependent lower lobes and lung bases. Two small pulmonary nodules, right lower lobe, image 103, series 3, 6 mm, and right upper lobe, image 78, series 3, 4 mm. No pleural effusion.  No pneumothorax. Musculoskeletal: No fracture or acute finding. No suspicious bone lesion. Review of the MIP images confirms the above  findings. CT ABDOMEN and PELVIS FINDINGS Hepatobiliary: Decreased liver attenuation consistent with fatty infiltration. No liver mass or focal lesion. Prior cholecystectomy. No bile duct dilation. Pancreas: Unremarkable. No pancreatic ductal dilatation or surrounding inflammatory changes. Spleen: Normal in size without focal abnormality. Adrenals/Urinary Tract: No adrenal mass. Kidneys normal in overall size, orientation and position. No renal mass, stone or hydronephrosis. Chronic bilateral perinephric stranding. Normal ureters. Bladder mostly decompressed. Wall is prominent. Stomach/Bowel: Normal stomach. Small bowel and colon are normal in caliber. No wall thickening. No inflammation. Sigmoid colon anastomosis staple line. Vascular/Lymphatic: Aortic atherosclerosis. No aneurysm. No enlarged lymph nodes. Reproductive: Enlarged prostate measuring 7.6 x 6.4 x 7.6 cm. Other: No abdominal wall hernia or abnormality. No abdominopelvic ascites. Musculoskeletal: No fracture or acute finding. No suspicious bone lesion. Review of the MIP images confirms the above findings. IMPRESSION: CTA CHEST 1. Positive for pulmonary emboli. There are multiple bilateral segmental pulmonary emboli. Positive for acute PE with CT evidence of right heart strain (RV/LV Ratio = 1.15) consistent with at least submassive (intermediate risk) PE. The presence of right heart strain has been associated with an increased risk of morbidity and mortality. Please refer to the "Code PE Focused" order set in EPIC. 2. Lungs show evidence of vascular shunting and atelectasis, but no evidence of an infarct, pleural effusion or pneumothorax. 3. Mild enlargement of the main pulmonary artery tp 3.9 cm. CT ABDOMEN AND PELVIS 1. No acute findings within the abdomen or pelvis. Electronically Signed   By: Lajean Manes M.D.   On: 09/19/2021 18:39   CT ABDOMEN PELVIS W CONTRAST  Result Date: 09/19/2021 CLINICAL DATA:  per ems pt was found down in his driveway,  states that he has probably been down since noon, pt has abrasions on bilat knees and a red rash on his abd. Pt arouses to verbal stim, pt states that he feel generally poorly, pt oriented to person, place and day of the week. EXAM: CT ANGIOGRAPHY CHEST CT ABDOMEN AND PELVIS WITH CONTRAST TECHNIQUE: Multidetector CT imaging of the chest was performed using the standard protocol during bolus administration of intravenous contrast. Multiplanar CT image reconstructions and MIPs were obtained to evaluate the vascular anatomy. Multidetector CT imaging of the abdomen and pelvis was performed using the standard protocol during bolus administration of intravenous contrast. RADIATION DOSE REDUCTION: This exam was performed according to the departmental dose-optimization program which includes automated exposure control, adjustment of the mA and/or kV according to patient size and/or use of iterative reconstruction technique. CONTRAST:  59m OMNIPAQUE IOHEXOL 350 MG/ML SOLN COMPARISON:  03/29/2020, CT abdomen pelvis. Current chest  radiograph. FINDINGS: CTA CHEST FINDINGS Cardiovascular: There are multiple bilateral pulmonary emboli. On the right, pulmonary emboli are noted extending from the upper lobe pulmonary artery into multiple segmental branches. They appear nonocclusive. There are segmental branch pulmonary emboli to the medial and lateral segments of the right middle lobe. Segmental pulmonary emboli are noted to all segments of the right lower lobe. On the left a nonocclusive embolus straddles the peripheral main pulmonary artery into the left upper lobe anterior and apical segmental branches and left upper lobe lingular branch. There are nonocclusive pulmonary emboli to the lower lobe segmental branches. No large central pulmonary emboli, however. Pulmonary artery is dilated to 3.9 cm. RV LV ratio is 1.15, mildly elevated. Heart is top-normal in size. Three-vessel coronary artery calcifications. No pericardial  effusion. Mild aortic atherosclerosis. No aneurysm or dissection. Mediastinum/Nodes: No neck base, mediastinal or hilar masses or enlarged lymph nodes. Trachea and esophagus are unremarkable. Lungs/Pleura: Subtle ground-glass opacities, most evident in the left upper lobe, likely due to vascular shunting. Linear atelectasis noted in the dependent lower lobes and lung bases. Two small pulmonary nodules, right lower lobe, image 103, series 3, 6 mm, and right upper lobe, image 78, series 3, 4 mm. No pleural effusion.  No pneumothorax. Musculoskeletal: No fracture or acute finding. No suspicious bone lesion. Review of the MIP images confirms the above findings. CT ABDOMEN and PELVIS FINDINGS Hepatobiliary: Decreased liver attenuation consistent with fatty infiltration. No liver mass or focal lesion. Prior cholecystectomy. No bile duct dilation. Pancreas: Unremarkable. No pancreatic ductal dilatation or surrounding inflammatory changes. Spleen: Normal in size without focal abnormality. Adrenals/Urinary Tract: No adrenal mass. Kidneys normal in overall size, orientation and position. No renal mass, stone or hydronephrosis. Chronic bilateral perinephric stranding. Normal ureters. Bladder mostly decompressed. Wall is prominent. Stomach/Bowel: Normal stomach. Small bowel and colon are normal in caliber. No wall thickening. No inflammation. Sigmoid colon anastomosis staple line. Vascular/Lymphatic: Aortic atherosclerosis. No aneurysm. No enlarged lymph nodes. Reproductive: Enlarged prostate measuring 7.6 x 6.4 x 7.6 cm. Other: No abdominal wall hernia or abnormality. No abdominopelvic ascites. Musculoskeletal: No fracture or acute finding. No suspicious bone lesion. Review of the MIP images confirms the above findings. IMPRESSION: CTA CHEST 1. Positive for pulmonary emboli. There are multiple bilateral segmental pulmonary emboli. Positive for acute PE with CT evidence of right heart strain (RV/LV Ratio = 1.15) consistent with  at least submassive (intermediate risk) PE. The presence of right heart strain has been associated with an increased risk of morbidity and mortality. Please refer to the "Code PE Focused" order set in EPIC. 2. Lungs show evidence of vascular shunting and atelectasis, but no evidence of an infarct, pleural effusion or pneumothorax. 3. Mild enlargement of the main pulmonary artery tp 3.9 cm. CT ABDOMEN AND PELVIS 1. No acute findings within the abdomen or pelvis. Electronically Signed   By: Lajean Manes M.D.   On: 09/19/2021 18:39   CT HEAD WO CONTRAST (5MM)  Result Date: 09/19/2021 CLINICAL DATA:  Found down, altered EXAM: CT HEAD WITHOUT CONTRAST TECHNIQUE: Contiguous axial images were obtained from the base of the skull through the vertex without intravenous contrast. RADIATION DOSE REDUCTION: This exam was performed according to the departmental dose-optimization program which includes automated exposure control, adjustment of the mA and/or kV according to patient size and/or use of iterative reconstruction technique. COMPARISON:  CT brain 05/18/2020, MRI 05/19/2020 FINDINGS: Brain: No hemorrhage or intracranial mass. Chronic left cerebellar infarct. Atrophy and fairly extensive chronic small vessel ischemic  changes of the white matter. More focal hypodensity within the left white matter, for example series 2, image 24 and series 2, image 27. These are not clearly present on comparison CT from 2022. Ventricles are nonenlarged. Vascular: No hyperdense vessels.  Carotid vascular calcification Skull: Normal. Negative for fracture or focal lesion. Sinuses/Orbits: No acute finding. Other: None IMPRESSION: 1. Negative for acute intracranial hemorrhage or mass 2. Atrophy and extensive chronic small vessel ischemic changes of the white matter. Interval hypodensities within the left white matter since the prior exam, consistent with age indeterminate small infarcts. Old left cerebellar infarct Electronically Signed    By: Donavan Foil M.D.   On: 09/19/2021 18:25   DG Chest Port 1 View  Result Date: 09/19/2021 CLINICAL DATA:  Found down EXAM: PORTABLE CHEST 1 VIEW COMPARISON:  06/25/2021 FINDINGS: Stable cardiomediastinal contours. Aortic atherosclerosis. Low lung volumes. No focal airspace consolidation, pleural effusion, or pneumothorax. IMPRESSION: No active disease. Electronically Signed   By: Davina Poke D.O.   On: 09/19/2021 17:12    Microbiology: Recent Results (from the past 240 hour(s))  Urine Culture     Status: Abnormal   Collection Time: 10/09/21  2:43 PM   Specimen: Urine, Random  Result Value Ref Range Status   Specimen Description   Final    URINE, RANDOM Performed at [facility], [address] [place], [place] [address]    Special Requests   Final    NONE Performed at [facility] [address], [address] [place], [place] [address]    Culture (A)  Final    >=100,000 COLONIES/mL PSEUDOMONAS AERUGINOSA >=100,000 COLONIES/mL ENTEROCOCCUS FAECALIS    Report Status 10/12/2021 FINAL  Final   Organism ID, Bacteria PSEUDOMONAS AERUGINOSA (A)  Final   Organism ID, Bacteria ENTEROCOCCUS FAECALIS (A)  Final      Susceptibility   Enterococcus faecalis - MIC*    AMPICILLIN <=2 SENSITIVE Sensitive     NITROFURANTOIN <=16 SENSITIVE Sensitive     VANCOMYCIN 1 SENSITIVE Sensitive     * >=100,000 COLONIES/mL ENTEROCOCCUS FAECALIS   Pseudomonas aeruginosa - MIC*    CEFTAZIDIME 2 SENSITIVE Sensitive     CIPROFLOXACIN 0.5 SENSITIVE Sensitive     GENTAMICIN <=1 SENSITIVE Sensitive     IMIPENEM 2 SENSITIVE Sensitive     PIP/TAZO 8 SENSITIVE Sensitive     CEFEPIME 2 SENSITIVE Sensitive     * >=100,000 COLONIES/mL PSEUDOMONAS AERUGINOSA     Labs: Basic Metabolic Panel: Recent Labs  Lab 10/09/21 1439 10/11/21 2046 10/13/21 0356  NA 133* 133* 135  K 4.5 4.3 4.3  CL 98 99 101  CO2 '26 26 29  '$ GLUCOSE 262* 230* 189*  BUN 21 20 29*  CREATININE 1.52* 1.64* 1.85*  CALCIUM  9.9 9.8 9.2   Liver Function Tests: Recent Labs  Lab 10/09/21 1439 10/11/21 2046  AST 17 17  ALT 16 14  ALKPHOS 59 53  BILITOT 0.8 0.8  PROT 7.9 7.9  ALBUMIN 3.7 3.4*   No results for input(s): "LIPASE", "AMYLASE" in the last 168 hours. No results for input(s): "AMMONIA" in the last 168 hours. CBC: Recent Labs  Lab 10/09/21 1439 10/11/21 2046 10/13/21 0356  WBC 11.8* 11.5* 6.0  NEUTROABS 9.4* 8.4*  --   HGB 14.2 14.3 10.0*  HCT 42.9 44.2 30.3*  MCV 89.2 90.0 90.4  PLT 289 299 230   Cardiac Enzymes: No results for input(s): "CKTOTAL", "CKMB", "CKMBINDEX", "TROPONINI" in the last 168 hours. BNP: BNP (last 3 results)  No results for input(s): "BNP" in the last 8760 hours.  ProBNP (last 3 results) No results for input(s): "PROBNP" in the last 8760 hours.  CBG: Recent Labs  Lab 10/12/21 1730 10/12/21 2111 10/12/21 2228 10/13/21 0742 10/13/21 1136  GLUCAP 171* 206* 178* 196* 214*       Signed:  Desma Maxim MD.  [facility] 10/13/2021, 3:16 PM

## 2021-10-14 ENCOUNTER — Telehealth: Payer: Self-pay | Admitting: *Deleted

## 2021-10-14 ENCOUNTER — Ambulatory Visit (INDEPENDENT_AMBULATORY_CARE_PROVIDER_SITE_OTHER): Payer: HMO | Admitting: Physician Assistant

## 2021-10-14 ENCOUNTER — Telehealth: Payer: Self-pay

## 2021-10-14 DIAGNOSIS — T839XXA Unspecified complication of genitourinary prosthetic device, implant and graft, initial encounter: Secondary | ICD-10-CM

## 2021-10-14 NOTE — Patient Outreach (Signed)
  Care Coordination TOC Note Transition Care Management Unsuccessful Follow-up Telephone Call  Date of discharge and from where:  [facility] 10/12/21-10/13/21  Attempts:  1st Attempt  Reason for unsuccessful TCM follow-up call:  No answer/busy

## 2021-10-14 NOTE — Progress Notes (Signed)
Patient came in this morning because his cath was leaking. I put a night bag on and added a sticker to his leg.

## 2021-10-14 NOTE — Telephone Encounter (Signed)
Transition Care Management Unsuccessful Follow-up Telephone Call  Date of discharge and from where:  [facility] 7-24  Attempts:  1st Attempt  Reason for unsuccessful TCM follow-up call:  No answer/busy

## 2021-10-14 NOTE — Telephone Encounter (Addendum)
Pt is returning Julie's call. Stated can call back later this afternoon.   [phone]

## 2021-10-15 ENCOUNTER — Encounter: Payer: Self-pay | Admitting: Nurse Practitioner

## 2021-10-15 ENCOUNTER — Ambulatory Visit: Payer: Self-pay

## 2021-10-15 ENCOUNTER — Ambulatory Visit (INDEPENDENT_AMBULATORY_CARE_PROVIDER_SITE_OTHER): Payer: HMO | Admitting: Nurse Practitioner

## 2021-10-15 ENCOUNTER — Other Ambulatory Visit: Payer: Self-pay | Admitting: *Deleted

## 2021-10-15 VITALS — BP 146/88 | HR 73 | Temp 98.4°F | Ht 71.0 in | Wt 261.4 lb

## 2021-10-15 DIAGNOSIS — Z9181 History of falling: Secondary | ICD-10-CM

## 2021-10-15 DIAGNOSIS — M25521 Pain in right elbow: Secondary | ICD-10-CM | POA: Diagnosis not present

## 2021-10-15 MED ORDER — LIDOCAINE 5 % EX PTCH
1.0000 | MEDICATED_PATCH | CUTANEOUS | 0 refills | Status: DC
Start: 2021-10-15 — End: 2021-11-24

## 2021-10-15 MED ORDER — HYDROCODONE-ACETAMINOPHEN 5-325 MG PO TABS: 1.0000 | ORAL_TABLET | Freq: Four times a day (QID) | ORAL | 0 refills | Status: AC | PRN

## 2021-10-15 NOTE — Assessment & Plan Note (Addendum)
Acute after being pulled up by EMS after a fall. Refuses imaging today. He reports no other injuries presenting.  Elbow injection provided in office today per request, however was upset as no immediate 100% improvement.  Discussed with him at length and educated on injections.  At this time will place referral to ortho for further assessment of joint and recommendations, although unsure he will attend as he reports he is "tired of seeing providers".  Script for short burst of Norco and Lidocaine patches sent in to pharmacy and educated on these.  Recommend he ice elbow every few hours for 24 hours. Return to office for worsening or ongoing.

## 2021-10-15 NOTE — Patient Instructions (Signed)
Pitcher's Elbow  Pitcher's elbow is a type of elbow injury that develops gradually over time (overuse injury). This condition is also called valgus extension overload syndrome (VEOS). This injury is common in athletes who make repeated overhead throwing motions, such as a baseball pitcher repeatedly throwing a ball. Throwing motions can: Overstretch the strong band of tissue (ligament) on the inside of the elbow (ulnar collateral ligament, or UCL). UCL injuries can range from minor inflammation to a complete ligament tear. Push the bones at the outside and back of the elbow together (compression). These forces can injure the ligament over time and create an abnormal extra bone in the elbow (osteophyte or bone spur). What are the causes? This condition may be caused by: Continuous or repetitive overhead throwing, such as baseball pitching. Improper throwing motion. Tightness in the shoulder that puts added demand on the elbow. What increases the risk? This condition is more likely to develop in athletes who play sports that involve repetitive forceful straightening of the elbow, such as: Baseball or softball. Tennis and other racquet sports. Football. Lacrosse. Gymnastics. [place]avelin. What are the signs or symptoms? Symptoms of this condition include: Elbow pain. Increased pain in your elbow as you straighten your arm forcefully, such as when throwing. Swelling. Limited range of motion or "locking" of the elbow. Popping or tearing sensation in your elbow. How is this diagnosed? This condition is diagnosed based on: Your symptoms and medical history. A physical exam. Your health care provider may: Check your elbow's strength, stability, and range of motion. Compare your injured elbow to your other elbow. Gently press your arm and elbow to find the source of pain. Imaging tests, such as: X-rays or CT scans to check for stress fractures and bone spurs. Ultrasound or MRI to check for  tears in the ligaments or tendons. How is this treated? Treatment for this condition may include: Stopping activities that require overhead arm motions, then returning gradually to full activities. Modifying your sports technique to decrease elbow strain. This may include wearing a brace. Taking medicine to relieve pain. Injecting medicines (corticosteroids) into your elbow to reduce swelling and pain. Doing strength and range-of-motion exercises (physical therapy) as told by your health care provider. Surgery. This may be needed if all other treatments do not work. It may involve: Repairing damaged ligaments. Removing abnormal bone growths. Removing pieces of bone or cartilage. After surgery, you will have to wear a brace for several weeks and eventually have physical therapy. Follow these instructions at home: If you have a brace: Wear the brace as told by your health care provider. Remove it only as told by your health care provider. Loosen the brace if your fingers tingle, become numb, or turn cold and blue. Keep the brace clean. If the brace is not waterproof: Do not let it get wet. Cover it with a watertight covering when you take a bath or a shower. Managing pain, stiffness, and swelling  If directed, put ice on the injured area. If you have a removable brace, remove it as told by your health care provider. Put ice in a plastic bag. Place a towel between your skin and the bag. Leave the ice on for 20 minutes, 2-3 times a day. Move your fingers often to avoid stiffness and to lessen swelling. Raise (elevate) the injured area above the level of your heart while you are sitting or lying down. Activity Rest your elbow and avoid activities that require overhead arm motions as told by your health  care provider. Return to your normal activities as told by your health care provider. Ask your health care provider what activities are safe for you. Do exercises as told by your health  care provider. General instructions Do not use any products that contain nicotine or tobacco, such as cigarettes, e-cigarettes, and chewing tobacco. These can delay healing. If you need help quitting, ask your health care provider. Take over-the-counter and prescription medicines only as told by your health care provider. Keep all follow-up visits as told by your health care provider. This is important. How is this prevented? Warm up and stretch before being active. Cool down and stretch after being active. Give your body time to rest between periods of activity. Maintain physical fitness, including: Strength. Flexibility. Have your technique checked to make sure you use proper form. Rest your elbow if you show signs of fatigue. When you start any new athletic activity, increase your participation slowly. Follow the rules for your sport on how often and how much you can throw in a game. Avoid overhead throwing motions as told by your health care provider. Contact a health care provider if: Your pain does not improve or it gets worse after 2-4 weeks of rest. Get help right away if: Your pain is severe. You cannot move your arm or elbow. These symptoms may represent a serious problem that is an emergency. Do not wait to see if the symptoms will go away. Get medical help right away. Call your local emergency services (911 in the [place] Do not drive yourself to the hospital. Summary Pitcher's elbow is a type of elbow injury that develops gradually over time. Treatment depends on the severity of the injury. Rest your elbow and avoid activities that require overhead arm motions as told by your health care provider. If directed, put ice on the injured area. This information is not intended to replace advice given to you by your health care provider. Make sure you discuss any questions you have with your health care provider. Document Revised: 06/06/2020 Document Reviewed: 06/06/2020 Elsevier  Patient Education  [place].

## 2021-10-15 NOTE — Progress Notes (Addendum)
BP (!) 146/88 (BP Location: Left Arm)   Pulse 73   Temp 98.4 F (36.9 C) (Oral)   Ht '5\' 11"'$  (1.803 m)   Wt 261 lb 6.4 oz (118.6 kg)   SpO2 94%   BMI 36.46 kg/m    Subjective:    Patient ID: Justin Griffith, male    DOB: [birth year], 81 y.o.   MRN: [id number]  HPI: Justin Griffith is a 81 y.o. male  Chief Complaint  Patient presents with   Elbow Pain    Patient is here for R elbow pain. Patient says he fell over a carpet shampoo-er as he was lifting and then fell again that evening. Patient says this happened yesterday. Patient says he has tried Aspirins. Patient wife says patient has a bruise over his right eye.    ELBOW PAIN (RIGHT) [place] yesterday twice -- fell over a mop and then fell in yard getting out of car.  He is not sure how he landed.  Reports he hurt elbow when EMS pulled on his arm to get him up from falling, did not fall on this arm.  States they pulled on arm hard.  Dominant is right side.  Reports he needs an elbow injection as this has helped before with his pain. Duration: days Location: elbow right side Mechanism of injury: trauma Onset: sudden Severity: 10/10  Quality:  dull, aching, stabbing, tender, tearing, and throbbing Frequency: constant Radiation: no Aggravating factors: bending and movement  Alleviating factors:  nothing   Status: fluctuating Treatments attempted: ASA  Relief with NSAIDs?:  No NSAIDs Taken Swelling: no Redness: no  Warmth: no Trauma: no Chest pain: no  Shortness of breath: no  Fever: no Decreased sensation: no Paresthesias: no Weakness: yes  Relevant past medical, surgical, family and social history reviewed and updated as indicated. Interim medical history since our last visit reviewed. Allergies and medications reviewed and updated.  Review of Systems  Constitutional:  Negative for activity change, diaphoresis, fatigue and fever.  Respiratory:  Negative for cough, chest tightness, shortness of breath and wheezing.    Cardiovascular:  Negative for chest pain, palpitations and leg swelling.  Gastrointestinal: Negative.   Musculoskeletal:  Positive for arthralgias.  Neurological: Negative.   Psychiatric/Behavioral: Negative.      Per HPI unless specifically indicated above     Objective:    BP (!) 146/88 (BP Location: Left Arm)   Pulse 73   Temp 98.4 F (36.9 C) (Oral)   Ht '5\' 11"'$  (1.803 m)   Wt 261 lb 6.4 oz (118.6 kg)   SpO2 94%   BMI 36.46 kg/m   Wt Readings from Last 3 Encounters:  10/15/21 261 lb 6.4 oz (118.6 kg)  10/11/21 253 lb 8.5 oz (115 kg)  10/11/21 253 lb 8.5 oz (115 kg)    Physical Exam Vitals and nursing note reviewed.  Constitutional:      General: He is awake. He is not in acute distress.    Appearance: He is well-developed and well-groomed. He is morbidly obese. He is not ill-appearing or toxic-appearing.  HENT:     Head: Normocephalic and atraumatic.      Right Ear: Hearing normal. No drainage.     Left Ear: Hearing normal. No drainage.  Eyes:     General: Lids are normal.        Right eye: No discharge.        Left eye: No discharge.     Conjunctiva/sclera: Conjunctivae normal.  Pupils: Pupils are equal, round, and reactive to light.  Neck:     Thyroid: No thyromegaly.     Vascular: No carotid bruit.  Cardiovascular:     Rate and Rhythm: Normal rate and regular rhythm.     Heart sounds: Normal heart sounds, S1 normal and S2 normal. No murmur heard.    No gallop.  Pulmonary:     Effort: Pulmonary effort is normal. No accessory muscle usage or respiratory distress.     Breath sounds: Normal breath sounds.  Abdominal:     General: Bowel sounds are normal.     Palpations: Abdomen is soft. There is no hepatomegaly or splenomegaly.  Musculoskeletal:     Right elbow: Swelling (mild to lateral aspect) present. No lacerations. Decreased range of motion. Tenderness present in medial epicondyle and lateral epicondyle.     Left elbow: Normal.     Cervical back:  Normal range of motion and neck supple.     Right lower leg: No edema.     Left lower leg: No edema.  Skin:    General: Skin is warm and dry.     Capillary Refill: Capillary refill takes less than 2 seconds.     Findings: No rash.     Comments: Multiple scratch marks and areas of bruising  to areas on arms and legs.  Neurological:     Mental Status: He is alert and oriented to person, place, and time.     Deep Tendon Reflexes: Reflexes are normal and symmetric.  Psychiatric:        Attention and Perception: Attention normal.        Mood and Affect: Mood normal.        Speech: Speech normal.        Behavior: Behavior normal. Behavior is cooperative.        Thought Content: Thought content normal.    STEROID INJECTION  Procedure: ELBOW RIGHT Steroid Injection  Description: After verbal consent and patient education on procedure, area prepped and draped using semi-sterile technique. Using a lateral approach to area of tenderness, a mixture of 2 cc of 1% Lido with epi & 1/2 cc of Kenalog 40 was injected into right elbow joint.  A bandage was then placed over the injection site.  Complications:  none, tolerated procedure well Post Procedure Instructions: To the ER if any symptoms of erythema or swelling.   Follow Up: PRN   Results for orders placed or performed during the hospital encounter of 10/11/21  CBC with Differential  Result Value Ref Range   WBC 11.5 (H) 4.0 - 10.5 K/uL   RBC 4.91 4.22 - 5.81 MIL/uL   Hemoglobin 14.3 13.0 - 17.0 g/dL   HCT 44.2 39.0 - 52.0 %   MCV 90.0 80.0 - 100.0 fL   MCH 29.1 26.0 - 34.0 pg   MCHC 32.4 30.0 - 36.0 g/dL   RDW 12.9 11.5 - 15.5 %   Platelets 299 150 - 400 K/uL   nRBC 0.0 0.0 - 0.2 %   Neutrophils Relative % 72 %   Neutro Abs 8.4 (H) 1.7 - 7.7 K/uL   Lymphocytes Relative 16 %   Lymphs Abs 1.8 0.7 - 4.0 K/uL   Monocytes Relative 8 %   Monocytes Absolute 1.0 0.1 - 1.0 K/uL   Eosinophils Relative 2 %   Eosinophils Absolute 0.2 0.0 - 0.5  K/uL   Basophils Relative 1 %   Basophils Absolute 0.1 0.0 - 0.1 K/uL   Immature  Granulocytes 1 %   Abs Immature Granulocytes 0.06 0.00 - 0.07 K/uL  Comprehensive metabolic panel  Result Value Ref Range   Sodium 133 (L) 135 - 145 mmol/L   Potassium 4.3 3.5 - 5.1 mmol/L   Chloride 99 98 - 111 mmol/L   CO2 26 22 - 32 mmol/L   Glucose, Bld 230 (H) 70 - 99 mg/dL   BUN 20 8 - 23 mg/dL   Creatinine, Ser 1.64 (H) 0.61 - 1.24 mg/dL   Calcium 9.8 8.9 - 10.3 mg/dL   Total Protein 7.9 6.5 - 8.1 g/dL   Albumin 3.4 (L) 3.5 - 5.0 g/dL   AST 17 15 - 41 U/L   ALT 14 0 - 44 U/L   Alkaline Phosphatase 53 38 - 126 U/L   Total Bilirubin 0.8 0.3 - 1.2 mg/dL   GFR, Estimated 42 (L) >60 mL/min   Anion gap 8 5 - 15  Glucose, capillary  Result Value Ref Range   Glucose-Capillary 141 (H) 70 - 99 mg/dL  Glucose, capillary  Result Value Ref Range   Glucose-Capillary 178 (H) 70 - 99 mg/dL  APTT  Result Value Ref Range   aPTT 92 (H) 24 - 36 seconds  Heparin level (unfractionated)  Result Value Ref Range   Heparin Unfractionated >1.10 (H) 0.30 - 0.70 IU/mL  Basic metabolic panel  Result Value Ref Range   Sodium 135 135 - 145 mmol/L   Potassium 4.3 3.5 - 5.1 mmol/L   Chloride 101 98 - 111 mmol/L   CO2 29 22 - 32 mmol/L   Glucose, Bld 189 (H) 70 - 99 mg/dL   BUN 29 (H) 8 - 23 mg/dL   Creatinine, Ser 1.85 (H) 0.61 - 1.24 mg/dL   Calcium 9.2 8.9 - 10.3 mg/dL   GFR, Estimated 36 (L) >60 mL/min   Anion gap 5 5 - 15  CBC  Result Value Ref Range   WBC 6.0 4.0 - 10.5 K/uL   RBC 3.35 (L) 4.22 - 5.81 MIL/uL   Hemoglobin 10.0 (L) 13.0 - 17.0 g/dL   HCT 30.3 (L) 39.0 - 52.0 %   MCV 90.4 80.0 - 100.0 fL   MCH 29.9 26.0 - 34.0 pg   MCHC 33.0 30.0 - 36.0 g/dL   RDW 13.0 11.5 - 15.5 %   Platelets 230 150 - 400 K/uL   nRBC 0.0 0.0 - 0.2 %  Glucose, capillary  Result Value Ref Range   Glucose-Capillary 171 (H) 70 - 99 mg/dL  Glucose, capillary  Result Value Ref Range   Glucose-Capillary 206 (H) 70 - 99  mg/dL  Glucose, capillary  Result Value Ref Range   Glucose-Capillary 178 (H) 70 - 99 mg/dL  APTT  Result Value Ref Range   aPTT 99 (H) 24 - 36 seconds  Glucose, capillary  Result Value Ref Range   Glucose-Capillary 196 (H) 70 - 99 mg/dL  Glucose, capillary  Result Value Ref Range   Glucose-Capillary 214 (H) 70 - 99 mg/dL  Glucose, capillary  Result Value Ref Range   Glucose-Capillary 156 (H) 70 - 99 mg/dL      Assessment & Plan:   Problem List Items Addressed This Visit       Other   At high risk for falls    Ongoing issue, has had PT multiple times, continue use of walker.  May benefit from further PT if ongoing falls.      Right elbow pain - Primary    Acute after being  pulled up by EMS after a fall. Refuses imaging today. He reports no other injuries presenting.  Elbow injection provided in office today per request, however was upset as no immediate 100% improvement.  Discussed with him at length and educated on injections.  At this time will place referral to ortho for further assessment of joint and recommendations, although unsure he will attend as he reports he is "tired of seeing providers".  Script for short burst of Norco and Lidocaine patches sent in to pharmacy and educated on these.  Recommend he ice elbow every few hours for 24 hours. Return to office for worsening or ongoing.      Relevant Orders   Ambulatory referral to Orthopedics     Follow up plan: Return if symptoms worsen or fail to improve.

## 2021-10-15 NOTE — Assessment & Plan Note (Signed)
Ongoing issue, has had PT multiple times, continue use of walker.  May benefit from further PT if ongoing falls.

## 2021-10-15 NOTE — Telephone Encounter (Signed)
    Chief Complaint: Right elbow injury after a fall yesterday. Symptoms: Pain 10/10 Frequency: Yesterday Pertinent Negatives: Patient denies  Disposition: '[]'$ ED /'[]'$ Urgent Care (no appt availability in office) / '[x]'$ Appointment(In office/virtual)/ '[]'$  [place] [facility]/ '[]'$ Home Care/ '[]'$ Refused Recommended Disposition /'[]'$ [place] [facility]/ '[]'$  Follow-up with PCP Additional Notes:   Reason for Disposition  Weakness (i.e., loss of strength) in hand or fingers  (Exception: Not truly weak; hand feels weak because of pain.)  Answer Assessment - Initial Assessment Questions 1. ONSET: "When did the pain start?"     Yesterday 2. LOCATION: "Where is the pain located?"     Right arm 3. PAIN: "How bad is the pain?" (Scale 1-10; or mild, moderate, severe)   - MILD (1-3): Doesn't interfere with normal activities.   - MODERATE (4-7): Interferes with normal activities (e.g., work or school) or awakens from sleep.   - SEVERE (8-10): Excruciating pain, unable to do any normal activities, unable to hold a cup of water.     10 4. WORK OR EXERCISE: "Has there been any recent work or exercise that involved this part of the body?"     Fell 5. CAUSE: "What do you think is causing the arm pain?"     Fell 6. OTHER SYMPTOMS: "Do you have any other symptoms?" (e.g., neck pain, swelling, rash, fever, numbness, weakness)     Unsure 7. PREGNANCY: "Is there any chance you are pregnant?" "When was your last menstrual period?"     N/a  Protocols used: Arm Pain-A-AH

## 2021-10-15 NOTE — Patient Outreach (Signed)
  Care Coordination [facility] Note Transition Care Management Follow-up Telephone Call Date of discharge and from where: 10/13/21 [facility] How have you been since you were released from the hospital? "My foley is ok now. It is working well with no blood coming out". Any questions or concerns? Yes, patient states he had to go the urologist yesterday due to his foley leaking. He explained that a larger catheter size was put in and now the foley is work well with no issues.  Items Reviewed: Did the pt receive and understand the discharge instructions provided? Yes  Medications obtained and verified? Yes  Other? No  Any new allergies since your discharge? No  Dietary orders reviewed? Yes Do you have support at home? Yes   Home Care and Equipment/Supplies: Were home health services ordered? no If so, what is the name of the agency? N/A  Has the agency set up a time to come to the patient's home? not applicable Were any new equipment or medical supplies ordered?  No What is the name of the medical supply agency? N/A Were you able to get the supplies/equipment? not applicable Do you have any questions related to the use of the equipment or supplies? No  Functional Questionnaire: (I = Independent and D = Dependent) ADLs: I  Bathing/Dressing- I  Meal Prep- I  Eating- I  Maintaining continence- I  Transferring/Ambulation- I  Managing Meds- I  Follow up appointments reviewed:  PCP Hospital f/u appt confirmed? No   Specialist Hospital f/u appt confirmed? Yes  Scheduled to see [facility] PAC on 11/13/21 @ 1400. Patient states he had an urology visit 10/14/21 Are transportation arrangements needed? No  If their condition worsens, is the pt aware to call PCP or go to the Emergency Dept.? Yes Was the patient provided with contact information for the PCP's office or ED? Yes Was to pt encouraged to call back with questions or concerns? Yes  SDOH assessments and interventions completed:    No  Care Coordination Interventions Activated:  No Care Coordination Interventions:   N/A  Encounter Outcome:  Pt. Visit Completed  Emelia Loron RN, BSN [place] [phone] [email or web address]

## 2021-10-16 ENCOUNTER — Ambulatory Visit (INDEPENDENT_AMBULATORY_CARE_PROVIDER_SITE_OTHER): Payer: HMO | Admitting: Physician Assistant

## 2021-10-16 ENCOUNTER — Encounter: Payer: Self-pay | Admitting: Physician Assistant

## 2021-10-16 ENCOUNTER — Other Ambulatory Visit: Payer: Self-pay | Admitting: Physician Assistant

## 2021-10-16 ENCOUNTER — Ambulatory Visit: Payer: HMO | Admitting: Physician Assistant

## 2021-10-16 DIAGNOSIS — T839XXD Unspecified complication of genitourinary prosthetic device, implant and graft, subsequent encounter: Secondary | ICD-10-CM

## 2021-10-16 DIAGNOSIS — R310 Gross hematuria: Secondary | ICD-10-CM

## 2021-10-16 DIAGNOSIS — T839XXA Unspecified complication of genitourinary prosthetic device, implant and graft, initial encounter: Secondary | ICD-10-CM | POA: Diagnosis not present

## 2021-10-16 NOTE — Patient Instructions (Addendum)
Keep taking your Eliquis (apixaban) as prescribed.  Do NOT take any of the following medications: -Aspirin (Excedrin, Alka-Seltzer, Bufferin) -Naproxen (Aleve, Motrin) -Ibuprofen (Advil) -Diclofenac (Voltaren)  Go to the Emergency Room if any of the following happens: -You start draining thick, ketchup-like urine in your catheter -Your urine gets dark maroon in color, like Merlot wine -Your catheter stops draining

## 2021-10-17 NOTE — Progress Notes (Signed)
10/16/2021 1:51 PM   Justin Griffith May 05, 1940 [id number]  CC: Chief Complaint  Patient presents with   Hematuria   HPI: Justin Griffith is a 81 y.o. extremely comorbid male with recurrent UTIs, gross hematuria, and urinary retention managed with indwelling Foley catheter newly on anticoagulation with Eliquis for PE awaiting PAE who presents today for evaluation of gross hematuria and leaking catheter. He is accompanied today by his wife.  Today he reports 1-2 days of painless gross hematuria. His catheter continues to drain, but is leaking. He is unsure of the source of the leaking.  Notably, he fell at home 2 days ago and took aspirin for pain relief. He remains on Eliquis. He wonders if I can prescribe him pain medication today for his elbow. He was prescribed Norco by his PCP yesterday.  He remains on culture-appropriate Cipro and amoxicillin for management of Pseudomonas and Enterococcus UTI.  PMH: Past Medical History:  Diagnosis Date   Acute urinary retention    Arthritis    Asthma 2000   Back pain    Colon polyp 2012   COPD (chronic obstructive pulmonary disease) (HCC)    Emphysema of lung (HCC)    Heart murmur    Hernia 2000   Hyperlipidemia    Hypertension    Personal history of malignant neoplasm of large intestine    Personal history of tobacco use, presenting hazards to health    Sleep apnea    Special screening for malignant neoplasms, colon    Type 2 diabetes mellitus with proteinuria ([place]) 10/11/2014    Surgical History: Past Surgical History:  Procedure Laterality Date   CARDIAC CATHETERIZATION Left 09/25/2015   Procedure: Left Heart Cath and Coronary Angiography;  Surgeon: Yolonda Kida, MD;  Location: [place] [facility];  Service: Cardiovascular;  Laterality: Left;   CHOLECYSTECTOMY  1970   COLON SURGERY  07-16-99   sigmoid colon resection with primary anastomosis, chemotherapy for metastatic disease   COLONOSCOPY  2001, 2012   Dr Bary Castilla,  tubular adenoma of the cecum and ascending colon in 2012.   COLONOSCOPY WITH PROPOFOL N/A 02/12/2016   Procedure: COLONOSCOPY WITH PROPOFOL;  Surgeon: Robert Bellow, MD;  Location: [facility] ENDOSCOPY;  Service: Endoscopy;  Laterality: N/A;   CORONARY STENT INTERVENTION N/A 05/17/2020   Procedure: CORONARY STENT INTERVENTION;  Surgeon: Yolonda Kida, MD;  Location: [place] [facility];  Service: Cardiovascular;  Laterality: N/A;  LAD   ESOPHAGOGASTRODUODENOSCOPY (EGD) WITH PROPOFOL N/A 02/12/2016   Procedure: ESOPHAGOGASTRODUODENOSCOPY (EGD) WITH PROPOFOL;  Surgeon: Robert Bellow, MD;  Location: [facility] ENDOSCOPY;  Service: Endoscopy;  Laterality: N/A;   HERNIA REPAIR Right    right inguinal hernia repair   JOINT REPLACEMENT Bilateral    knee replacement   KNEE SURGERY Bilateral 2010   LEFT HEART CATH AND CORONARY ANGIOGRAPHY N/A 05/17/2020   Procedure: LEFT HEART CATH AND CORONARY ANGIOGRAPHY possible PCI and stent;  Surgeon: Yolonda Kida, MD;  Location: [place] [facility];  Service: Cardiovascular;  Laterality: N/A;   MYRINGOTOMY WITH TUBE PLACEMENT Bilateral 08/16/2014   Procedure: MYRINGOTOMY WITH TUBE PLACEMENT;  Surgeon: Carloyn Manner, MD;  Location: [facility] ORS;  Service: ENT;  Laterality: Bilateral;   PERIPHERAL VASCULAR CATHETERIZATION Right 09/04/2015   Procedure: Lower Extremity Angiography;  Surgeon: Katha Cabal, MD;  Location: [place] [facility];  Service: Cardiovascular;  Laterality: Right;   PERIPHERAL VASCULAR CATHETERIZATION  09/04/2015   Procedure: Lower Extremity Intervention;  Surgeon: Katha Cabal, MD;  Location: [place] [facility];  Service: Cardiovascular;;   TONSILLECTOMY     TYMPANOSTOMY TUBE PLACEMENT      Home Medications:  Allergies as of 10/16/2021       Reactions   Farxiga [dapagliflozin] Rash   Causes a severe rash in groin area   Dulaglutide Diarrhea, Other (See Comments)   Liraglutide Diarrhea, Other (See Comments)   Jardiance  [empagliflozin] Rash        Medication List        Accurate as of October 16, 2021 11:59 PM. If you have any questions, ask your nurse or doctor.          amoxicillin 500 MG capsule Commonly known as: AMOXIL Take 1 capsule (500 mg total) by mouth every 8 (eight) hours.   apixaban 5 MG Tabs tablet Commonly known as: Eliquis Take 1 tablet (5 mg total) by mouth 2 (two) times daily.   Breztri Aerosphere 160-9-4.8 MCG/ACT Aero Generic drug: Budeson-Glycopyrrol-Formoterol Inhale 1 puff into the lungs daily.   carvedilol 25 MG tablet Commonly known as: COREG   ciprofloxacin 500 MG tablet Commonly known as: Cipro Take 1 tablet (500 mg total) by mouth 2 (two) times daily.   cloNIDine 0.1 MG tablet Commonly known as: CATAPRES Take 0.1 mg by mouth 2 (two) times daily.   fenofibrate 145 MG tablet Commonly known as: Tricor Take 1 tablet (145 mg total) by mouth daily.   hydrALAZINE 50 MG tablet Commonly known as: APRESOLINE TAKE ONE TABLET BY MOUTH THREE TIMES A DAY FOR BLOOD PRESSURE   HYDROcodone-acetaminophen 5-325 MG tablet Commonly known as: Norco Take 1 tablet by mouth every 6 (six) hours as needed for up to 5 days for moderate pain.   insulin glargine-yfgn 100 UNIT/ML Pen Commonly known as: SEMGLEE INJECT 10 UNITS UNDER SKIN ONCE EVERY DAY FOR DIABETES DISCARD PEN 28 DAYS AFTER OPENING. * GIVE AT THE SAME TIME DAILY *   lidocaine 5 % Commonly known as: Lidoderm Place 1 patch onto the skin daily. Remove & Discard patch within 12 hours or as directed by MD   losartan 100 MG tablet Commonly known as: COZAAR Take 100 mg by mouth daily.   magnesium oxide 400 (240 Mg) MG tablet Commonly known as: MAG-OX Take 1 tablet (400 mg total) by mouth daily.   metFORMIN 1000 MG tablet Commonly known as: GLUCOPHAGE Take 1 tablet (1,000 mg total) by mouth 2 (two) times daily with a meal.   mirabegron ER 50 MG Tb24 tablet Commonly known as: MYRBETRIQ Take 1 tablet (50 mg  total) by mouth daily.   nortriptyline 10 MG capsule Commonly known as: PAMELOR Take 10 mg by mouth 2 (two) times daily.   Oxybutynin Chloride 2.5 MG Tabs Take 1 tablet by mouth daily.   oxybutynin 5 MG tablet Commonly known as: DITROPAN Take by mouth.   rosuvastatin 40 MG tablet Commonly known as: CRESTOR Take 1 tablet (40 mg total) by mouth at bedtime.   Spiriva Respimat 2.5 MCG/ACT Aers Generic drug: Tiotropium Bromide Monohydrate Inhale 2 puffs into the lungs daily.   Torsemide 40 MG Tabs Take 80 mg by mouth 2 (two) times daily.   vitamin B-12 500 MCG tablet Commonly known as: CYANOCOBALAMIN Take 2,000 mcg by mouth at bedtime.   Vitamin D (Cholecalciferol) 25 MCG (1000 UT) Caps Take 2,000 mcg by mouth at bedtime.        Allergies:  Allergies  Allergen Reactions   Farxiga [Dapagliflozin] Rash    Causes a  severe rash in groin area    Dulaglutide Diarrhea and Other (See Comments)   Liraglutide Diarrhea and Other (See Comments)   Jardiance [Empagliflozin] Rash    Family History: Family History  Problem Relation Age of Onset   Diabetes Father    Esophageal cancer Mother    Alzheimer's disease Paternal Uncle     Social History:   reports that he quit smoking about 32 years ago. His smoking use included cigarettes. He has a 30.00 pack-year smoking history. He has been exposed to tobacco smoke. He has never used smokeless tobacco. He reports that he does not drink alcohol and does not use drugs.  Physical Exam: There were no vitals taken for this visit.  Constitutional:  Alert and oriented, no acute distress, nontoxic appearing HEENT: [place], AT Cardiovascular: No clubbing, cyanosis, or edema Respiratory: Normal respiratory effort, no increased work of breathing GU: Foley catheter in place draining pink urine. Long threads of mucoid clot along the drainage tubing. 3-way catheter irrigation port is unplugged and leaking. Skin: No rashes, bruises or suspicious  lesions Neurologic: Grossly intact, no focal deficits, moving all 4 extremities Psychiatric: Normal mood and affect  Bladder Irrigation  Due to gross hematuria patient is present today for a bladder irrigation. Patient was cleaned and prepped in a sterile fashion. 120 ml of sterile water was instilled and irrigated into the bladder with a 23m Toomey syringe through the catheter in place.  One long mucoid clot thread was evacuated from the bladder with this and urine cleared from pink to light pink. Catheter irrigated easily and was reattached to the night bag for drainage. Patient tolerated well.   Performed by: SDebroah Loop PA-C and RVerlene Mayer CMA  Assessment & Plan:   1. Gross hematuria Likely due to aspirin use on top of Eliquis with known BPH. I counseled him to continue Eliquis as prescribed but avoid OTC blood thinners, list provided in his AVS today.  I irrigated his Foley today without difficulty; no indication for CBI at this time.  We extensively discussed warning signs of gross hematuria and clot retention today. They expressed understanding.  Will attempt to expedite PAE given multiple hospitalizations and repeat catheter issues.  2. Foley catheter problem, subsequent encounter Irrigation port plugged today to resolve leakage.   Return if symptoms worsen or fail to improve.  SDebroah Loop PA-C  [facility] [address] SRayleBNicholls [place] [id number]([phone]

## 2021-10-20 ENCOUNTER — Ambulatory Visit: Payer: HMO | Admitting: Family Medicine

## 2021-10-21 ENCOUNTER — Ambulatory Visit: Payer: HMO | Admitting: Physician Assistant

## 2021-10-24 ENCOUNTER — Telehealth: Payer: Self-pay

## 2021-10-24 DIAGNOSIS — J449 Chronic obstructive pulmonary disease, unspecified: Secondary | ICD-10-CM

## 2021-10-24 NOTE — Telephone Encounter (Signed)
Patient called complaining of leakage from catheter. Patient states the urine is draining into catheter bag. Patient denies any blood clots or any other symptoms. I advised pt unfortunately it is normal and is having bladder spasms. Patient did become agitated and said "just forget it" and hung up the phone

## 2021-10-24 NOTE — Telephone Encounter (Signed)
Copied from [place] [id number]. Topic: General - Other >> Oct 24, 2021 11:18 AM Cyndi Bender wrote: Reason for CRM: [place] with [facility] reports patient declined service. Cb# [phone]   FYI to provider that referred patient.

## 2021-10-24 NOTE — Telephone Encounter (Signed)
Copied from [place] [id number]. Topic: General - Other >> Oct 24, 2021 11:29 AM Everette C wrote: Reason for CRM: The patient has been told by their pharmacy that their [facility] AEROSPHERE 160-9-4.8 MCG/ACT AERO [[id number]] prescription will cost them $603   The patient would like to know if it is possible to receive samples of the medication or be prescribed an alternative   Please contact further when possible

## 2021-10-27 DIAGNOSIS — Z9989 Dependence on other enabling machines and devices: Secondary | ICD-10-CM | POA: Diagnosis not present

## 2021-10-27 DIAGNOSIS — G4733 Obstructive sleep apnea (adult) (pediatric): Secondary | ICD-10-CM | POA: Diagnosis not present

## 2021-10-27 DIAGNOSIS — J449 Chronic obstructive pulmonary disease, unspecified: Secondary | ICD-10-CM | POA: Diagnosis not present

## 2021-10-27 NOTE — Telephone Encounter (Signed)
Doreen Salvage, RN Case Manager with [employer], called the office today on the patient's behalf.   Patient has a foley catheter and is experiencing urinary leakage.  He understands bladder spasms but feels that there is a large amount of urine that is not flowing into the catheter.   Patient was only told that his only option is to wear a depends and we will not change his catheter for another 20 days.  Do you have any additional advice or if there is medication that we can prescribe?

## 2021-10-28 ENCOUNTER — Ambulatory Visit
Admission: RE | Admit: 2021-10-28 | Discharge: 2021-10-28 | Disposition: A | Discharge status: Home / Self Care | Payer: HMO | Source: Ambulatory Visit | Attending: Physician Assistant | Admitting: Physician Assistant

## 2021-10-28 DIAGNOSIS — R338 Other retention of urine: Secondary | ICD-10-CM

## 2021-10-28 DIAGNOSIS — I872 Venous insufficiency (chronic) (peripheral): Secondary | ICD-10-CM | POA: Diagnosis not present

## 2021-10-28 DIAGNOSIS — I214 Non-ST elevation (NSTEMI) myocardial infarction: Secondary | ICD-10-CM | POA: Diagnosis not present

## 2021-10-28 DIAGNOSIS — I252 Old myocardial infarction: Secondary | ICD-10-CM | POA: Diagnosis not present

## 2021-10-28 DIAGNOSIS — I639 Cerebral infarction, unspecified: Secondary | ICD-10-CM | POA: Diagnosis not present

## 2021-10-28 DIAGNOSIS — G4733 Obstructive sleep apnea (adult) (pediatric): Secondary | ICD-10-CM | POA: Diagnosis not present

## 2021-10-28 DIAGNOSIS — R600 Localized edema: Secondary | ICD-10-CM | POA: Diagnosis not present

## 2021-10-28 DIAGNOSIS — J449 Chronic obstructive pulmonary disease, unspecified: Secondary | ICD-10-CM | POA: Diagnosis not present

## 2021-10-28 DIAGNOSIS — E1159 Type 2 diabetes mellitus with other circulatory complications: Secondary | ICD-10-CM | POA: Diagnosis not present

## 2021-10-28 DIAGNOSIS — I824Y1 Acute embolism and thrombosis of unspecified deep veins of right proximal lower extremity: Secondary | ICD-10-CM | POA: Diagnosis not present

## 2021-10-28 DIAGNOSIS — E1169 Type 2 diabetes mellitus with other specified complication: Secondary | ICD-10-CM | POA: Diagnosis not present

## 2021-10-28 DIAGNOSIS — I2510 Atherosclerotic heart disease of native coronary artery without angina pectoris: Secondary | ICD-10-CM | POA: Diagnosis not present

## 2021-10-29 NOTE — Telephone Encounter (Signed)
Patient states he has already picked up samples. No further questions.

## 2021-10-29 NOTE — Telephone Encounter (Signed)
He can pick up samples (4) for now and I'll put in a referral to [facility] to see if they can help get it less expensively.

## 2021-10-30 ENCOUNTER — Telehealth: Payer: Self-pay | Admitting: Pharmacist

## 2021-10-30 ENCOUNTER — Inpatient Hospital Stay
Admission: EM | Admit: 2021-10-30 | Discharge: 2021-11-04 | DRG: 065 | Disposition: A | Discharge status: Skilled Nursing Facility | Payer: HMO | Attending: Internal Medicine | Admitting: Internal Medicine

## 2021-10-30 ENCOUNTER — Emergency Department: Payer: HMO

## 2021-10-30 DIAGNOSIS — I6381 Other cerebral infarction due to occlusion or stenosis of small artery: Secondary | ICD-10-CM | POA: Diagnosis not present

## 2021-10-30 DIAGNOSIS — Z7901 Long term (current) use of anticoagulants: Secondary | ICD-10-CM

## 2021-10-30 DIAGNOSIS — R4182 Altered mental status, unspecified: Secondary | ICD-10-CM | POA: Diagnosis not present

## 2021-10-30 DIAGNOSIS — R0689 Other abnormalities of breathing: Secondary | ICD-10-CM | POA: Diagnosis not present

## 2021-10-30 DIAGNOSIS — E669 Obesity, unspecified: Secondary | ICD-10-CM | POA: Diagnosis present

## 2021-10-30 DIAGNOSIS — E785 Hyperlipidemia, unspecified: Secondary | ICD-10-CM | POA: Diagnosis present

## 2021-10-30 DIAGNOSIS — I639 Cerebral infarction, unspecified: Secondary | ICD-10-CM | POA: Diagnosis present

## 2021-10-30 DIAGNOSIS — Z833 Family history of diabetes mellitus: Secondary | ICD-10-CM

## 2021-10-30 DIAGNOSIS — E1151 Type 2 diabetes mellitus with diabetic peripheral angiopathy without gangrene: Secondary | ICD-10-CM | POA: Diagnosis present

## 2021-10-30 DIAGNOSIS — E1165 Type 2 diabetes mellitus with hyperglycemia: Secondary | ICD-10-CM | POA: Diagnosis present

## 2021-10-30 DIAGNOSIS — M7989 Other specified soft tissue disorders: Secondary | ICD-10-CM | POA: Diagnosis not present

## 2021-10-30 DIAGNOSIS — Z85038 Personal history of other malignant neoplasm of large intestine: Secondary | ICD-10-CM

## 2021-10-30 DIAGNOSIS — M47812 Spondylosis without myelopathy or radiculopathy, cervical region: Secondary | ICD-10-CM | POA: Diagnosis not present

## 2021-10-30 DIAGNOSIS — M25571 Pain in right ankle and joints of right foot: Secondary | ICD-10-CM | POA: Diagnosis not present

## 2021-10-30 DIAGNOSIS — I440 Atrioventricular block, first degree: Secondary | ICD-10-CM | POA: Diagnosis present

## 2021-10-30 DIAGNOSIS — E781 Pure hyperglyceridemia: Secondary | ICD-10-CM | POA: Diagnosis present

## 2021-10-30 DIAGNOSIS — E1129 Type 2 diabetes mellitus with other diabetic kidney complication: Secondary | ICD-10-CM | POA: Diagnosis present

## 2021-10-30 DIAGNOSIS — S8992XA Unspecified injury of left lower leg, initial encounter: Secondary | ICD-10-CM | POA: Diagnosis not present

## 2021-10-30 DIAGNOSIS — R296 Repeated falls: Secondary | ICD-10-CM | POA: Diagnosis present

## 2021-10-30 DIAGNOSIS — R531 Weakness: Principal | ICD-10-CM

## 2021-10-30 DIAGNOSIS — M25572 Pain in left ankle and joints of left foot: Secondary | ICD-10-CM | POA: Diagnosis not present

## 2021-10-30 DIAGNOSIS — Z86711 Personal history of pulmonary embolism: Secondary | ICD-10-CM

## 2021-10-30 DIAGNOSIS — Z79899 Other long term (current) drug therapy: Secondary | ICD-10-CM

## 2021-10-30 DIAGNOSIS — Z96653 Presence of artificial knee joint, bilateral: Secondary | ICD-10-CM | POA: Diagnosis present

## 2021-10-30 DIAGNOSIS — E1122 Type 2 diabetes mellitus with diabetic chronic kidney disease: Secondary | ICD-10-CM | POA: Diagnosis present

## 2021-10-30 DIAGNOSIS — Z794 Long term (current) use of insulin: Secondary | ICD-10-CM | POA: Diagnosis not present

## 2021-10-30 DIAGNOSIS — Z87891 Personal history of nicotine dependence: Secondary | ICD-10-CM

## 2021-10-30 DIAGNOSIS — S0993XA Unspecified injury of face, initial encounter: Secondary | ICD-10-CM | POA: Diagnosis not present

## 2021-10-30 DIAGNOSIS — N138 Other obstructive and reflux uropathy: Secondary | ICD-10-CM | POA: Diagnosis present

## 2021-10-30 DIAGNOSIS — R0789 Other chest pain: Secondary | ICD-10-CM | POA: Diagnosis not present

## 2021-10-30 DIAGNOSIS — J449 Chronic obstructive pulmonary disease, unspecified: Secondary | ICD-10-CM | POA: Diagnosis present

## 2021-10-30 DIAGNOSIS — I130 Hypertensive heart and chronic kidney disease with heart failure and stage 1 through stage 4 chronic kidney disease, or unspecified chronic kidney disease: Secondary | ICD-10-CM | POA: Diagnosis present

## 2021-10-30 DIAGNOSIS — I10 Essential (primary) hypertension: Secondary | ICD-10-CM | POA: Diagnosis present

## 2021-10-30 DIAGNOSIS — N1832 Chronic kidney disease, stage 3b: Secondary | ICD-10-CM | POA: Diagnosis present

## 2021-10-30 DIAGNOSIS — Z951 Presence of aortocoronary bypass graft: Secondary | ICD-10-CM

## 2021-10-30 DIAGNOSIS — I2510 Atherosclerotic heart disease of native coronary artery without angina pectoris: Secondary | ICD-10-CM | POA: Diagnosis present

## 2021-10-30 DIAGNOSIS — R220 Localized swelling, mass and lump, head: Secondary | ICD-10-CM | POA: Diagnosis not present

## 2021-10-30 DIAGNOSIS — M1990 Unspecified osteoarthritis, unspecified site: Secondary | ICD-10-CM | POA: Diagnosis present

## 2021-10-30 DIAGNOSIS — Z800 Family history of malignant neoplasm of digestive organs: Secondary | ICD-10-CM

## 2021-10-30 DIAGNOSIS — I5032 Chronic diastolic (congestive) heart failure: Secondary | ICD-10-CM | POA: Diagnosis present

## 2021-10-30 DIAGNOSIS — Z8673 Personal history of transient ischemic attack (TIA), and cerebral infarction without residual deficits: Secondary | ICD-10-CM

## 2021-10-30 DIAGNOSIS — R29702 NIHSS score 2: Secondary | ICD-10-CM | POA: Diagnosis present

## 2021-10-30 DIAGNOSIS — Z820 Family history of epilepsy and other diseases of the nervous system: Secondary | ICD-10-CM

## 2021-10-30 DIAGNOSIS — Z7984 Long term (current) use of oral hypoglycemic drugs: Secondary | ICD-10-CM

## 2021-10-30 DIAGNOSIS — N401 Enlarged prostate with lower urinary tract symptoms: Secondary | ICD-10-CM | POA: Diagnosis present

## 2021-10-30 DIAGNOSIS — G8191 Hemiplegia, unspecified affecting right dominant side: Secondary | ICD-10-CM | POA: Diagnosis present

## 2021-10-30 DIAGNOSIS — R270 Ataxia, unspecified: Secondary | ICD-10-CM | POA: Diagnosis present

## 2021-10-30 DIAGNOSIS — S8991XA Unspecified injury of right lower leg, initial encounter: Secondary | ICD-10-CM | POA: Diagnosis not present

## 2021-10-30 DIAGNOSIS — Z6836 Body mass index (BMI) 36.0-36.9, adult: Secondary | ICD-10-CM

## 2021-10-30 DIAGNOSIS — Z888 Allergy status to other drugs, medicaments and biological substances status: Secondary | ICD-10-CM

## 2021-10-30 DIAGNOSIS — J439 Emphysema, unspecified: Secondary | ICD-10-CM | POA: Diagnosis present

## 2021-10-30 DIAGNOSIS — W19XXXA Unspecified fall, initial encounter: Secondary | ICD-10-CM | POA: Diagnosis not present

## 2021-10-30 LAB — CBC
HCT: 40 % (ref 39.0–52.0)
Hemoglobin: 12.8 g/dL — ABNORMAL LOW (ref 13.0–17.0)
MCH: 29.4 pg (ref 26.0–34.0)
MCHC: 32 g/dL (ref 30.0–36.0)
MCV: 91.7 fL (ref 80.0–100.0)
Platelets: 258 10*3/uL (ref 150–400)
RBC: 4.36 MIL/uL (ref 4.22–5.81)
RDW: 13.3 % (ref 11.5–15.5)
WBC: 7.5 10*3/uL (ref 4.0–10.5)
nRBC: 0 % (ref 0.0–0.2)

## 2021-10-30 LAB — BASIC METABOLIC PANEL
Anion gap: 5 (ref 5–15)
BUN: 21 mg/dL (ref 8–23)
CO2: 25 mmol/L (ref 22–32)
Calcium: 10.1 mg/dL (ref 8.9–10.3)
Chloride: 107 mmol/L (ref 98–111)
Creatinine, Ser: 1.65 mg/dL — ABNORMAL HIGH (ref 0.61–1.24)
GFR, Estimated: 41 mL/min — ABNORMAL LOW (ref >60)
Glucose, Bld: 148 mg/dL — ABNORMAL HIGH (ref 70–99)
Potassium: 4.2 mmol/L (ref 3.5–5.1)
Sodium: 137 mmol/L (ref 135–145)

## 2021-10-30 LAB — HEPATIC FUNCTION PANEL
ALT: 15 U/L (ref 0–44)
AST: 16 U/L (ref 15–41)
Albumin: 3.3 g/dL — ABNORMAL LOW (ref 3.5–5.0)
Alkaline Phosphatase: 54 U/L (ref 38–126)
Bilirubin, Direct: 0.1 mg/dL (ref 0.0–0.2)
Indirect Bilirubin: 0.8 mg/dL (ref 0.3–0.9)
Total Bilirubin: 0.9 mg/dL (ref 0.3–1.2)
Total Protein: 7 g/dL (ref 6.5–8.1)

## 2021-10-30 LAB — TROPONIN I (HIGH SENSITIVITY): Troponin I (High Sensitivity): 18 ng/L — ABNORMAL HIGH (ref <18)

## 2021-10-30 LAB — SAMPLE TO BLOOD BANK

## 2021-10-30 NOTE — Progress Notes (Signed)
[facility])  [place] Team    10/30/2021  Justin Griffith 08-Aug-1940 [id number]  Reason for referral: Medication Assistance  Referral source:  [place] Current insurance: [employer] C-[employer]  Outreach:  Successful telephone call with Justin Griffith.  HIPAA identifiers verified.    Objective: The ASCVD Risk score (Arnett DK, et al., 2019) failed to calculate for the following reasons:   The 2019 ASCVD risk score is only valid for ages 33 to 51   The patient has a prior MI or stroke diagnosis  Lab Results  Component Value Date   CREATININE 1.85 (H) 10/13/2021   CREATININE 1.64 (H) 10/11/2021   CREATININE 1.52 (H) 10/09/2021    Lab Results  Component Value Date   HGBA1C 8.7 (H) 09/05/2021    Lipid Panel     Component Value Date/Time   CHOL 255 (H) 09/05/2021 1347   CHOL 89 02/02/2017 0823   TRIG 615 (HH) 09/05/2021 1347   TRIG 262 (H) 02/02/2017 0823   HDL 30 (L) 09/05/2021 1347   CHOLHDL 8.5 (H) 09/05/2021 1347   CHOLHDL 2.7 05/20/2020 0419   VLDL 31 05/20/2020 0419   VLDL 52 (H) 02/02/2017 0823   LDLCALC 116 (H) 09/05/2021 1347    BP Readings from Last 3 Encounters:  10/15/21 (!) 146/88  10/13/21 (!) 178/71  10/11/21 (!) 195/99    Allergies  Allergen Reactions   Wilder Glade [Dapagliflozin] Rash    Causes a severe rash in groin area    Dulaglutide Diarrhea and Other (See Comments)   Liraglutide Diarrhea and Other (See Comments)   Jardiance [Empagliflozin] Rash    Assessment:  Placed telephone outreach to patient to discuss medication assistance for [employer] from referral placed by practice. I attempted multiple times to explain the nature of my call and how the patient assistance application process works through the medication manufacturer. I explained to patient several times that the qualification to be approved for the Breztri inhaler to sent to him at no cost to him was to meet the  income threshold for the program. Patient repeatedly stated that he did not understand why I needed the information and why his wife needed to be involved. I offered to have someone from the practice to call the patient and he declined. I also offered to call his local pharmacy at [facility] and have a pharmacist from there explain to him, patient declined this also.  I informed patient that he is not required to pursue patient assistance and that I would let the office know that I have contacted him to discuss the assistance program.     Plan: -Will route note to patient's primary care provider.   -[facility] is happy to assist the patient/family in the future for clinical pharmacy needs, following a discussion from your team about [place] outreach. Thank you for allowing [facility] to be a part of your patient's care.     Loretha Brasil, PharmD [facility] Pharmacist Office: [phone]

## 2021-10-30 NOTE — ED Provider Notes (Signed)
[facility] Provider Note    Event Date/Time   First MD Initiated Contact with Patient 10/30/21 2331     (approximate)   History   Weakness   HPI  Justin Griffith is a 81 y.o. male with a past medical history of PE on Eliquis with patient reporting compliance, HTN, DM, COPD, obesity, PAD, CAD, CKD and a recent diagnosis of UTI with chronic indwelling Foley catheter discharged from the hospital on 7/24 on ciprofloxacin who presents for evaluation of weakness and 3 falls that occurred earlier today.  Patient does not know exactly how he fell stating he does not recall being specifically weak in any 1 location or tripping.  He does use a walker and was using a walker when he was out and fell earlier.  He states that EMS to come out initially but that they had helped him up and only returned on the third fall.  He endorses a mild headache as well as some soreness in both knees and ankles.  He denies any back pain, chest pain, abdominal pain or any recent shortness of breath, chest pain, fevers, cough, abdominal pain, diarrhea or any other sick symptoms.  He does not think he has had any changes to his medications.  He denies any illicit drug use.      Physical Exam  Triage Vital Signs: ED Triage Vitals  Enc Vitals Group     BP 10/30/21 2005 134/69     Pulse Rate 10/30/21 2005 (!) 50     Resp 10/30/21 2005 (!) 21     Temp 10/30/21 2005 98.6 F (37 C)     Temp Source 10/30/21 2005 Oral     SpO2 10/30/21 2005 97 %     Weight --      Height --      Head Circumference --      Peak Flow --      Pain Score 10/30/21 2006 6     Pain Loc --      Pain Edu? --      Excl. in [place]? --     Most recent vital signs: Vitals:   10/31/21 0124 10/31/21 0130  BP:  (!) 137/94  Pulse:  (!) 59  Resp:  17  Temp: 98.8 F (37.1 C)   SpO2:  94%    General: Awake, no distress.  CV:  Good peripheral perfusion.  2+ radial pulses.  Slightly bradycardic. Resp:  Normal effort.   Clear bilaterally. Abd:  No distention.  Soft throughout. Other:  Patient is a mild tremor in his upper extremities.  No pronator drift or finger dysmetria.  Patient is oriented.  Cranial nerves are grossly intact.  He has symmetric strength in all extremities.  There is no tenderness along the C/T/L-spine.  Patient does have some bruising and some mild tenderness of the anterior SPECT of both knees and a little bit of tenderness around the medial aspect of both ankles.  No other trauma visible to the extremities.   ED Results / Procedures / Treatments  Labs (all labs ordered are listed, but only abnormal results are displayed) Labs Reviewed  BASIC METABOLIC PANEL - Abnormal; Notable for the following components:      Result Value   Glucose, Bld 148 (*)    Creatinine, Ser 1.65 (*)    GFR, Estimated 41 (*)    All other components within normal limits  CBC - Abnormal; Notable for the following components:  Hemoglobin 12.8 (*)    All other components within normal limits  HEPATIC FUNCTION PANEL - Abnormal; Notable for the following components:   Albumin 3.3 (*)    All other components within normal limits  URINALYSIS, COMPLETE (UACMP) WITH MICROSCOPIC - Abnormal; Notable for the following components:   Color, Urine YELLOW (*)    APPearance HAZY (*)    Hgb urine dipstick LARGE (*)    Protein, ur 100 (*)    Leukocytes,Ua TRACE (*)    Bacteria, UA RARE (*)    All other components within normal limits  TROPONIN I (HIGH SENSITIVITY) - Abnormal; Notable for the following components:   Troponin I (High Sensitivity) 18 (*)    All other components within normal limits  URINE CULTURE  TSH  MAGNESIUM  CBG MONITORING, ED  SAMPLE TO BLOOD BANK  TROPONIN I (HIGH SENSITIVITY)     EKG  EKG is remarkable for sinus bradycardia with a first-degree block with a PR interval of 232, normal axis with some nonspecific ST changes in aVL, V6 and V5 as well as V3.  Otherwise normal axis and otherwise  unremarkable intervals.   RADIOLOGY  CT head, face and C-spine on my interpretation without evidence of skull fracture, intracranial hemorrhage or acute C-spine injury.  I reviewed radiology interpretation and agree their findings with small vessel disease and old lacunar infarct as well as possible chronic bilateral mastoiditis, some soft tissue swelling over the bilateral supraorbital tissues and multilevel degenerative changes in the C-spine.  Chest x-ray my interpretation without evidence of focal consolidation, effusion, edema, pneumothorax or other clear acute process.  I reviewed radiology interpretation and agree to findings of mild cardiomegaly without other acute process.  X-ray of the right knee my interpretation without evidence of a fracture or dislocation.  I reviewed radiology interpretation and agree to findings of a small suprapatellar effusion and total knee arthroplasty.  X-ray of the left knee my interpretation without evidence of acute fracture or dislocation.  I reviewed radiology interpretation and agree to findings of a small suprapatellar effusion and total joint arthroplasty with some small rounded ossified bodies in degrees.  X-ray of the left ankle my interpretation without evidence of acute fracture dislocation.  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  X-ray of the right ankle interpretation without evidence of acute fracture or dislocation.  I reviewed radiology interpretation and agree with their findings of no acute fracture dislocation or other acute process.   PROCEDURES:  Critical Care performed: No  .1-3 Lead EKG Interpretation  Performed by: Lucrezia Starch, MD Authorized by: Lucrezia Starch, MD     Interpretation: normal     ECG rate assessment: normal     Rhythm: sinus bradycardia      Ectopy: none     Conduction: abnormal     Abnormal conduction: 1st degree AV block     The patient is on the cardiac monitor to evaluate for evidence of arrhythmia and/or significant heart rate changes.   MEDICATIONS ORDERED IN ED: Medications - No data to display   IMPRESSION / MDM / [place] / ED COURSE  I reviewed the triage vital signs and the nursing notes. Patient's presentation is most consistent with acute presentation with potential threat to life or bodily function.  Differential diagnosis includes, but is not limited to recurrent falls secondary to metabolic derangements, arrhythmia such as symptomatic bradycardia, ACS, hypothyroidism, orthostasis, subtle CVA that I am unable appreciate clear deficits of on exam, and possible worsening UTI.  EKG is remarkable for sinus bradycardia with a first-degree block with a PR interval of 232, normal axis with some nonspecific ST changes in aVL, V6 and V5 as well as V3.  Otherwise normal axis and otherwise unremarkable intervals.  Initial troponin is 18 compared to 47 1 month ago.  CBC without leukocytosis and hemoglobin of 12.8 compared to 10 2 weeks ago with normal platelets.  BMP remarkable for glucose of 148 and a creatinine of 1.65, to 1.852 weeks ago without other derangements.  Hepatic function panel is unremarkable.  UA has hemoglobin some protein but does not otherwise appear infected.  This was obtained from exchange Foley catheter.    CT head, face and C-spine on my interpretation without evidence of skull fracture, intracranial hemorrhage or acute C-spine injury.  I reviewed radiology interpretation and agree their findings with small vessel disease and old lacunar infarct as well as possible chronic bilateral mastoiditis, some soft tissue swelling over the bilateral supraorbital tissues and multilevel degenerative changes in the C-spine.  Chest x-ray my interpretation without evidence  of focal consolidation, effusion, edema, pneumothorax or other clear acute process.  I reviewed radiology interpretation and agree to findings of mild cardiomegaly without other acute process.  X-ray of the right knee my interpretation without evidence of a fracture or dislocation.  I reviewed radiology interpretation and agree to findings of a small suprapatellar effusion and total knee arthroplasty.  X-ray of the left knee my interpretation without evidence of acute fracture or dislocation.  I reviewed radiology interpretation and agree to findings of a small suprapatellar effusion and total joint arthroplasty with some small rounded ossified bodies in degrees.  X-ray of the left ankle my interpretation without evidence of acute fracture dislocation.  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  X-ray of the right ankle interpretation without evidence of acute fracture or dislocation.  I reviewed radiology interpretation and agree with their findings of no acute fracture dislocation or other acute process.  On trial of ambulation patient is too weak to get up to stand and try to walk.  I will admit for recurrent falls and weakness.  I will send MRI to rule stroke but I cannot see on exam at this time.      FINAL CLINICAL IMPRESSION(S) / ED DIAGNOSES   Final diagnoses:  Weakness  Recurrent falls     Rx / DC Orders   ED Discharge Orders     None        Note:  This document was prepared using Dragon voice recognition software and may include unintentional dictation errors.   Lucrezia Starch, MD 10/31/21 [phone]

## 2021-10-30 NOTE — ED Notes (Signed)
Transported to CT 

## 2021-10-30 NOTE — ED Triage Notes (Addendum)
Fall x3 today from loss of balance. Lives with wife. Endorses falling straight back onto carpet and hitting his head. Uses a walker at baseline. Endorses L eye pain without vision change.   Takes a blood thinner (eliquis, no missed doses).  Denies SOB, CP, fever, cough, abd pain, headache.   Spoke to Dr.Funke via phone about case.

## 2021-10-30 NOTE — ED Triage Notes (Signed)
EMS brings pt in from home (3rd call out to home today) for c/o weakness/fall, AMS; currently taking eliquist; taking cipro for recent UTI; lives at home with wife who has dementia

## 2021-10-31 ENCOUNTER — Inpatient Hospital Stay: Payer: HMO

## 2021-10-31 ENCOUNTER — Other Ambulatory Visit: Payer: Self-pay

## 2021-10-31 ENCOUNTER — Emergency Department: Payer: HMO

## 2021-10-31 DIAGNOSIS — E1151 Type 2 diabetes mellitus with diabetic peripheral angiopathy without gangrene: Secondary | ICD-10-CM | POA: Diagnosis not present

## 2021-10-31 DIAGNOSIS — Z86711 Personal history of pulmonary embolism: Secondary | ICD-10-CM

## 2021-10-31 DIAGNOSIS — I5032 Chronic diastolic (congestive) heart failure: Secondary | ICD-10-CM

## 2021-10-31 DIAGNOSIS — Z6836 Body mass index (BMI) 36.0-36.9, adult: Secondary | ICD-10-CM | POA: Diagnosis not present

## 2021-10-31 DIAGNOSIS — E781 Pure hyperglyceridemia: Secondary | ICD-10-CM | POA: Diagnosis present

## 2021-10-31 DIAGNOSIS — E1165 Type 2 diabetes mellitus with hyperglycemia: Secondary | ICD-10-CM | POA: Diagnosis present

## 2021-10-31 DIAGNOSIS — N401 Enlarged prostate with lower urinary tract symptoms: Secondary | ICD-10-CM

## 2021-10-31 DIAGNOSIS — Z7901 Long term (current) use of anticoagulants: Secondary | ICD-10-CM | POA: Diagnosis not present

## 2021-10-31 DIAGNOSIS — I2510 Atherosclerotic heart disease of native coronary artery without angina pectoris: Secondary | ICD-10-CM | POA: Diagnosis present

## 2021-10-31 DIAGNOSIS — G4733 Obstructive sleep apnea (adult) (pediatric): Secondary | ICD-10-CM | POA: Diagnosis not present

## 2021-10-31 DIAGNOSIS — G8191 Hemiplegia, unspecified affecting right dominant side: Secondary | ICD-10-CM | POA: Diagnosis present

## 2021-10-31 DIAGNOSIS — H9190 Unspecified hearing loss, unspecified ear: Secondary | ICD-10-CM | POA: Diagnosis not present

## 2021-10-31 DIAGNOSIS — I6782 Cerebral ischemia: Secondary | ICD-10-CM | POA: Diagnosis not present

## 2021-10-31 DIAGNOSIS — N1832 Chronic kidney disease, stage 3b: Secondary | ICD-10-CM | POA: Diagnosis not present

## 2021-10-31 DIAGNOSIS — Z951 Presence of aortocoronary bypass graft: Secondary | ICD-10-CM | POA: Diagnosis not present

## 2021-10-31 DIAGNOSIS — I6381 Other cerebral infarction due to occlusion or stenosis of small artery: Secondary | ICD-10-CM | POA: Diagnosis present

## 2021-10-31 DIAGNOSIS — M25571 Pain in right ankle and joints of right foot: Secondary | ICD-10-CM | POA: Diagnosis not present

## 2021-10-31 DIAGNOSIS — M6281 Muscle weakness (generalized): Secondary | ICD-10-CM | POA: Diagnosis not present

## 2021-10-31 DIAGNOSIS — R296 Repeated falls: Secondary | ICD-10-CM | POA: Diagnosis not present

## 2021-10-31 DIAGNOSIS — Z8673 Personal history of transient ischemic attack (TIA), and cerebral infarction without residual deficits: Secondary | ICD-10-CM | POA: Diagnosis not present

## 2021-10-31 DIAGNOSIS — I252 Old myocardial infarction: Secondary | ICD-10-CM | POA: Diagnosis not present

## 2021-10-31 DIAGNOSIS — I10 Essential (primary) hypertension: Secondary | ICD-10-CM

## 2021-10-31 DIAGNOSIS — F32A Depression, unspecified: Secondary | ICD-10-CM | POA: Diagnosis not present

## 2021-10-31 DIAGNOSIS — E669 Obesity, unspecified: Secondary | ICD-10-CM | POA: Diagnosis present

## 2021-10-31 DIAGNOSIS — G9389 Other specified disorders of brain: Secondary | ICD-10-CM | POA: Diagnosis not present

## 2021-10-31 DIAGNOSIS — E785 Hyperlipidemia, unspecified: Secondary | ICD-10-CM | POA: Diagnosis not present

## 2021-10-31 DIAGNOSIS — R270 Ataxia, unspecified: Secondary | ICD-10-CM | POA: Diagnosis present

## 2021-10-31 DIAGNOSIS — Z466 Encounter for fitting and adjustment of urinary device: Secondary | ICD-10-CM | POA: Diagnosis not present

## 2021-10-31 DIAGNOSIS — E119 Type 2 diabetes mellitus without complications: Secondary | ICD-10-CM | POA: Diagnosis not present

## 2021-10-31 DIAGNOSIS — S8992XA Unspecified injury of left lower leg, initial encounter: Secondary | ICD-10-CM | POA: Diagnosis not present

## 2021-10-31 DIAGNOSIS — I6523 Occlusion and stenosis of bilateral carotid arteries: Secondary | ICD-10-CM | POA: Diagnosis not present

## 2021-10-31 DIAGNOSIS — N138 Other obstructive and reflux uropathy: Secondary | ICD-10-CM | POA: Diagnosis not present

## 2021-10-31 DIAGNOSIS — I639 Cerebral infarction, unspecified: Secondary | ICD-10-CM | POA: Diagnosis not present

## 2021-10-31 DIAGNOSIS — Z85038 Personal history of other malignant neoplasm of large intestine: Secondary | ICD-10-CM | POA: Diagnosis not present

## 2021-10-31 DIAGNOSIS — E7800 Pure hypercholesterolemia, unspecified: Secondary | ICD-10-CM | POA: Diagnosis not present

## 2021-10-31 DIAGNOSIS — M7989 Other specified soft tissue disorders: Secondary | ICD-10-CM | POA: Diagnosis not present

## 2021-10-31 DIAGNOSIS — I440 Atrioventricular block, first degree: Secondary | ICD-10-CM | POA: Diagnosis present

## 2021-10-31 DIAGNOSIS — R0789 Other chest pain: Secondary | ICD-10-CM | POA: Diagnosis not present

## 2021-10-31 DIAGNOSIS — Z7984 Long term (current) use of oral hypoglycemic drugs: Secondary | ICD-10-CM | POA: Diagnosis not present

## 2021-10-31 DIAGNOSIS — J449 Chronic obstructive pulmonary disease, unspecified: Secondary | ICD-10-CM

## 2021-10-31 DIAGNOSIS — R29702 NIHSS score 2: Secondary | ICD-10-CM | POA: Diagnosis present

## 2021-10-31 DIAGNOSIS — Z794 Long term (current) use of insulin: Secondary | ICD-10-CM | POA: Diagnosis not present

## 2021-10-31 DIAGNOSIS — M1990 Unspecified osteoarthritis, unspecified site: Secondary | ICD-10-CM | POA: Diagnosis present

## 2021-10-31 DIAGNOSIS — J439 Emphysema, unspecified: Secondary | ICD-10-CM | POA: Diagnosis present

## 2021-10-31 DIAGNOSIS — Z888 Allergy status to other drugs, medicaments and biological substances status: Secondary | ICD-10-CM | POA: Diagnosis not present

## 2021-10-31 DIAGNOSIS — M25572 Pain in left ankle and joints of left foot: Secondary | ICD-10-CM | POA: Diagnosis not present

## 2021-10-31 DIAGNOSIS — Z87891 Personal history of nicotine dependence: Secondary | ICD-10-CM | POA: Diagnosis not present

## 2021-10-31 DIAGNOSIS — E1129 Type 2 diabetes mellitus with other diabetic kidney complication: Secondary | ICD-10-CM | POA: Diagnosis present

## 2021-10-31 DIAGNOSIS — S8991XA Unspecified injury of right lower leg, initial encounter: Secondary | ICD-10-CM | POA: Diagnosis not present

## 2021-10-31 DIAGNOSIS — E538 Deficiency of other specified B group vitamins: Secondary | ICD-10-CM | POA: Diagnosis not present

## 2021-10-31 DIAGNOSIS — E1122 Type 2 diabetes mellitus with diabetic chronic kidney disease: Secondary | ICD-10-CM | POA: Diagnosis not present

## 2021-10-31 DIAGNOSIS — E559 Vitamin D deficiency, unspecified: Secondary | ICD-10-CM | POA: Diagnosis not present

## 2021-10-31 DIAGNOSIS — I69351 Hemiplegia and hemiparesis following cerebral infarction affecting right dominant side: Secondary | ICD-10-CM | POA: Diagnosis not present

## 2021-10-31 DIAGNOSIS — R531 Weakness: Secondary | ICD-10-CM | POA: Diagnosis present

## 2021-10-31 DIAGNOSIS — I130 Hypertensive heart and chronic kidney disease with heart failure and stage 1 through stage 4 chronic kidney disease, or unspecified chronic kidney disease: Secondary | ICD-10-CM | POA: Diagnosis not present

## 2021-10-31 DIAGNOSIS — G319 Degenerative disease of nervous system, unspecified: Secondary | ICD-10-CM | POA: Diagnosis not present

## 2021-10-31 LAB — URINALYSIS, COMPLETE (UACMP) WITH MICROSCOPIC
Bilirubin Urine: NEGATIVE
Glucose, UA: NEGATIVE mg/dL
Ketones, ur: NEGATIVE mg/dL
Nitrite: NEGATIVE
Protein, ur: 100 mg/dL — AB
Specific Gravity, Urine: 1.005 (ref 1.005–1.030)
Squamous Epithelial / HPF: NONE SEEN (ref 0–5)
pH: 5 (ref 5.0–8.0)

## 2021-10-31 LAB — CBG MONITORING, ED
Glucose-Capillary: 181 mg/dL — ABNORMAL HIGH (ref 70–99)
Glucose-Capillary: 175 mg/dL — ABNORMAL HIGH (ref 70–99)

## 2021-10-31 LAB — BRAIN NATRIURETIC PEPTIDE: B Natriuretic Peptide: 103.4 pg/mL — ABNORMAL HIGH (ref 0.0–100.0)

## 2021-10-31 LAB — TSH: TSH: 2.134 u[IU]/mL (ref 0.350–4.500)

## 2021-10-31 LAB — HEMOGLOBIN A1C
Hgb A1c MFr Bld: 8.1 % — ABNORMAL HIGH (ref 4.8–5.6)
Mean Plasma Glucose: 185.77 mg/dL

## 2021-10-31 LAB — TROPONIN I (HIGH SENSITIVITY): Troponin I (High Sensitivity): 16 ng/L (ref <18)

## 2021-10-31 LAB — MAGNESIUM: Magnesium: 1.9 mg/dL (ref 1.7–2.4)

## 2021-10-31 LAB — GLUCOSE, CAPILLARY: Glucose-Capillary: 195 mg/dL — ABNORMAL HIGH (ref 70–99)

## 2021-10-31 MED ORDER — IPRATROPIUM-ALBUTEROL 0.5-2.5 (3) MG/3ML IN SOLN
3.0000 mL | RESPIRATORY_TRACT | Status: DC | PRN
Start: 2021-10-31 — End: 2021-11-04
  Administered 2021-11-02: 3 mL via RESPIRATORY_TRACT
  Filled 2021-10-31: qty 3

## 2021-10-31 MED ORDER — INSULIN ASPART 100 UNIT/ML IJ SOLN
0.0000 [IU] | Freq: Three times a day (TID) | INTRAMUSCULAR | Status: DC
  Administered 2021-10-31 – 2021-11-01 (×3): 2 [IU] via SUBCUTANEOUS
  Administered 2021-11-01 – 2021-11-02 (×5): 3 [IU] via SUBCUTANEOUS
  Administered 2021-11-03: 2 [IU] via SUBCUTANEOUS
  Administered 2021-11-03: 1 [IU] via SUBCUTANEOUS
  Administered 2021-11-03 – 2021-11-04 (×2): 2 [IU] via SUBCUTANEOUS
  Filled 2021-10-31 (×12): qty 1

## 2021-10-31 MED ORDER — ARFORMOTEROL TARTRATE 15 MCG/2ML IN NEBU
15.0000 ug | INHALATION_SOLUTION | Freq: Two times a day (BID) | RESPIRATORY_TRACT | Status: DC
  Administered 2021-11-01 – 2021-11-04 (×7): 15 ug via RESPIRATORY_TRACT
  Filled 2021-10-31 (×8): qty 2

## 2021-10-31 MED ORDER — MIRABEGRON ER 50 MG PO TB24
50.0000 mg | ORAL_TABLET | Freq: Every day | ORAL | Status: DC
  Administered 2021-10-31 – 2021-11-04 (×5): 50 mg via ORAL
  Filled 2021-10-31 (×5): qty 1

## 2021-10-31 MED ORDER — INSULIN GLARGINE-YFGN 100 UNIT/ML ~~LOC~~ SOPN
10.0000 [IU] | PEN_INJECTOR | Freq: Every day | SUBCUTANEOUS | Status: DC
  Filled 2021-10-31: qty 3

## 2021-10-31 MED ORDER — ONDANSETRON HCL 4 MG/2ML IJ SOLN: 4.0000 mg | Freq: Four times a day (QID) | INTRAMUSCULAR | Status: DC | PRN

## 2021-10-31 MED ORDER — HYDRALAZINE HCL 50 MG PO TABS
50.0000 mg | ORAL_TABLET | Freq: Three times a day (TID) | ORAL | Status: DC
Start: 2021-10-31 — End: 2021-10-31

## 2021-10-31 MED ORDER — ROSUVASTATIN CALCIUM 20 MG PO TABS
40.0000 mg | ORAL_TABLET | Freq: Every day | ORAL | Status: DC
  Administered 2021-10-31 – 2021-11-03 (×4): 40 mg via ORAL
  Filled 2021-10-31 (×5): qty 2

## 2021-10-31 MED ORDER — CLONIDINE HCL 0.1 MG PO TABS
0.1000 mg | ORAL_TABLET | Freq: Two times a day (BID) | ORAL | Status: DC
Start: 2021-10-31 — End: 2021-10-31

## 2021-10-31 MED ORDER — FENOFIBRATE 160 MG PO TABS
160.0000 mg | ORAL_TABLET | Freq: Every day | ORAL | Status: DC
  Administered 2021-10-31 – 2021-11-04 (×5): 160 mg via ORAL
  Filled 2021-10-31 (×5): qty 1

## 2021-10-31 MED ORDER — ACETAMINOPHEN 650 MG RE SUPP: 650.0000 mg | Freq: Four times a day (QID) | RECTAL | Status: DC | PRN

## 2021-10-31 MED ORDER — CARVEDILOL 25 MG PO TABS
25.0000 mg | ORAL_TABLET | Freq: Two times a day (BID) | ORAL | Status: DC
Start: 2021-10-31 — End: 2021-10-31

## 2021-10-31 MED ORDER — ALBUTEROL SULFATE (2.5 MG/3ML) 0.083% IN NEBU: 3.0000 mL | INHALATION_SOLUTION | RESPIRATORY_TRACT | Status: DC | PRN

## 2021-10-31 MED ORDER — ARFORMOTEROL TARTRATE 15 MCG/2ML IN NEBU
15.0000 ug | INHALATION_SOLUTION | Freq: Two times a day (BID) | RESPIRATORY_TRACT | Status: DC
  Filled 2021-10-31: qty 2

## 2021-10-31 MED ORDER — DM-GUAIFENESIN ER 30-600 MG PO TB12: 1.0000 | ORAL_TABLET | Freq: Two times a day (BID) | ORAL | Status: DC | PRN

## 2021-10-31 MED ORDER — ONDANSETRON HCL 4 MG PO TABS: 4.0000 mg | ORAL_TABLET | Freq: Four times a day (QID) | ORAL | Status: DC | PRN

## 2021-10-31 MED ORDER — TORSEMIDE 20 MG PO TABS
80.0000 mg | ORAL_TABLET | Freq: Two times a day (BID) | ORAL | Status: DC
  Administered 2021-10-31 – 2021-11-01 (×3): 80 mg via ORAL
  Filled 2021-10-31 (×3): qty 4

## 2021-10-31 MED ORDER — GABAPENTIN 600 MG PO TABS
600.0000 mg | ORAL_TABLET | Freq: Two times a day (BID) | ORAL | Status: DC
  Administered 2021-10-31 – 2021-11-04 (×9): 600 mg via ORAL
  Filled 2021-10-31 (×9): qty 1

## 2021-10-31 MED ORDER — MAGNESIUM OXIDE -MG SUPPLEMENT 400 (240 MG) MG PO TABS
400.0000 mg | ORAL_TABLET | Freq: Every day | ORAL | Status: DC
  Administered 2021-10-31 – 2021-11-04 (×5): 400 mg via ORAL
  Filled 2021-10-31 (×5): qty 1

## 2021-10-31 MED ORDER — INSULIN GLARGINE-YFGN 100 UNIT/ML ~~LOC~~ SOLN
10.0000 [IU] | Freq: Every day | SUBCUTANEOUS | Status: DC
Start: 2021-10-31 — End: 2021-10-31

## 2021-10-31 MED ORDER — STROKE: EARLY STAGES OF RECOVERY BOOK: Freq: Once | Status: AC

## 2021-10-31 MED ORDER — TRAZODONE HCL 50 MG PO TABS
25.0000 mg | ORAL_TABLET | Freq: Every evening | ORAL | Status: DC | PRN
  Administered 2021-10-31 – 2021-11-02 (×2): 25 mg via ORAL
  Filled 2021-10-31 (×2): qty 1

## 2021-10-31 MED ORDER — UMECLIDINIUM BROMIDE 62.5 MCG/ACT IN AEPB
1.0000 | INHALATION_SPRAY | Freq: Every day | RESPIRATORY_TRACT | Status: DC
  Administered 2021-11-01 – 2021-11-04 (×4): 1 via RESPIRATORY_TRACT
  Filled 2021-10-31: qty 7

## 2021-10-31 MED ORDER — LOSARTAN POTASSIUM 50 MG PO TABS: 100.0000 mg | ORAL_TABLET | Freq: Every day | ORAL | Status: DC

## 2021-10-31 MED ORDER — CLONIDINE HCL 0.1 MG PO TABS
0.1000 mg | ORAL_TABLET | Freq: Two times a day (BID) | ORAL | Status: DC
  Administered 2021-10-31 – 2021-11-02 (×5): 0.1 mg via ORAL
  Filled 2021-10-31 (×7): qty 1

## 2021-10-31 MED ORDER — SODIUM CHLORIDE 0.9 % IV SOLN: INTRAVENOUS | Status: DC

## 2021-10-31 MED ORDER — BUDESONIDE 0.5 MG/2ML IN SUSP
0.5000 mg | Freq: Two times a day (BID) | RESPIRATORY_TRACT | Status: DC
Start: 2021-11-01 — End: 2021-11-04
  Administered 2021-11-01 – 2021-11-04 (×7): 0.5 mg via RESPIRATORY_TRACT
  Filled 2021-10-31 (×7): qty 2

## 2021-10-31 MED ORDER — BUDESON-GLYCOPYRROL-FORMOTEROL 160-9-4.8 MCG/ACT IN AERO
1.0000 | INHALATION_SPRAY | Freq: Every day | RESPIRATORY_TRACT | Status: DC
Start: 2021-10-31 — End: 2021-10-31

## 2021-10-31 MED ORDER — HYDRALAZINE HCL 20 MG/ML IJ SOLN: 5.0000 mg | INTRAMUSCULAR | Status: DC | PRN

## 2021-10-31 MED ORDER — VITAMIN B-12 1000 MCG PO TABS
1000.0000 ug | ORAL_TABLET | Freq: Every day | ORAL | Status: DC
  Administered 2021-10-31 – 2021-11-03 (×4): 1000 ug via ORAL
  Filled 2021-10-31 (×5): qty 1

## 2021-10-31 MED ORDER — INSULIN ASPART 100 UNIT/ML IJ SOLN: 0.0000 [IU] | Freq: Every day | INTRAMUSCULAR | Status: DC

## 2021-10-31 MED ORDER — MAGNESIUM HYDROXIDE 400 MG/5ML PO SUSP: 30.0000 mL | Freq: Every day | ORAL | Status: DC | PRN

## 2021-10-31 MED ORDER — VITAMIN D 25 MCG (1000 UNIT) PO TABS
2000.0000 [IU] | ORAL_TABLET | Freq: Every day | ORAL | Status: DC
  Administered 2021-10-31 – 2021-11-03 (×4): 2000 [IU] via ORAL
  Filled 2021-10-31 (×4): qty 2

## 2021-10-31 MED ORDER — INSULIN GLARGINE-YFGN 100 UNIT/ML ~~LOC~~ SOLN
7.0000 [IU] | Freq: Every day | SUBCUTANEOUS | Status: DC
  Administered 2021-10-31: 7 [IU] via SUBCUTANEOUS
  Filled 2021-10-31 (×2): qty 0.07

## 2021-10-31 MED ORDER — APIXABAN 5 MG PO TABS
5.0000 mg | ORAL_TABLET | Freq: Two times a day (BID) | ORAL | Status: DC
  Administered 2021-10-31 – 2021-11-04 (×9): 5 mg via ORAL
  Filled 2021-10-31 (×9): qty 1

## 2021-10-31 MED ORDER — ACETAMINOPHEN 325 MG PO TABS
650.0000 mg | ORAL_TABLET | Freq: Four times a day (QID) | ORAL | Status: DC | PRN
  Administered 2021-11-03: 650 mg via ORAL
  Filled 2021-10-31: qty 2

## 2021-10-31 MED ORDER — TIOTROPIUM BROMIDE MONOHYDRATE 2.5 MCG/ACT IN AERS
2.0000 | INHALATION_SPRAY | Freq: Every day | RESPIRATORY_TRACT | Status: DC
Start: 2021-10-31 — End: 2021-10-31

## 2021-10-31 MED ORDER — UMECLIDINIUM BROMIDE 62.5 MCG/ACT IN AEPB
1.0000 | INHALATION_SPRAY | Freq: Every day | RESPIRATORY_TRACT | Status: DC
Start: 2021-10-31 — End: 2021-10-31
  Filled 2021-10-31: qty 7

## 2021-10-31 MED ORDER — LIDOCAINE 5 % EX PTCH
1.0000 | MEDICATED_PATCH | CUTANEOUS | Status: DC
  Administered 2021-10-31: 1 via TRANSDERMAL
  Filled 2021-10-31 (×5): qty 1

## 2021-10-31 MED ORDER — BUDESONIDE 0.5 MG/2ML IN SUSP: 0.5000 mg | Freq: Two times a day (BID) | RESPIRATORY_TRACT | Status: DC

## 2021-10-31 MED ORDER — NORTRIPTYLINE HCL 10 MG PO CAPS
10.0000 mg | ORAL_CAPSULE | Freq: Two times a day (BID) | ORAL | Status: DC
  Administered 2021-10-31 – 2021-11-04 (×9): 10 mg via ORAL
  Filled 2021-10-31 (×9): qty 1

## 2021-10-31 NOTE — Assessment & Plan Note (Signed)
Likely due to stroke -PT/OT -Fall precaution

## 2021-10-31 NOTE — Assessment & Plan Note (Signed)
-   Crestor and fenofibrate

## 2021-10-31 NOTE — ED Notes (Addendum)
Note entered in error

## 2021-10-31 NOTE — ED Notes (Signed)
Pt called out as he had spilled his breakfast in the floor- this RN cleaned up pt and floor- pt repositioned in bed and new breakfast given- pt denies any other needs at this time

## 2021-10-31 NOTE — Assessment & Plan Note (Addendum)
S/p of CABG.  No chest pain.  Troponin 18, then 16, which likely nonspecific change. -Continue Crestor, fenofibrate

## 2021-10-31 NOTE — Assessment & Plan Note (Signed)
acute subcortical infarct on the left, likely small vessel infarct.  Consulted Dr. Leonel Ramsay of neurology.  Patient had 2D echo on 09/20/2021 which showed EF of 60 to 65%, will not repeat 2D echo. Plan: --cont Eliquis --per neuro, would not add antiplatelet therapy while he is on anticoagulation. --cont statin

## 2021-10-31 NOTE — Assessment & Plan Note (Signed)
-   On Eliquis and Crestor, fenofibrate

## 2021-10-31 NOTE — ED Notes (Signed)
Informed RN bed assigned 

## 2021-10-31 NOTE — ED Notes (Signed)
Patient resting in bed free from sign of distress. Breathing unlabored speaking in full sentences with symmetric chest rise and fall. Bed low and locked with side rails raised x2. Call bell in reach and monitor in place.   

## 2021-10-31 NOTE — Assessment & Plan Note (Signed)
  BMI= 36.26 and BW= 117.9 -Diet and exercise.   -Encouraged to lose weight.

## 2021-10-31 NOTE — Assessment & Plan Note (Signed)
Recent A1c 8.9, poorly controlled.  Patient taking metformin and glargine insulin 10 unit daily -Glargine insulin 7 units daily -Sliding scale insulin

## 2021-10-31 NOTE — H&P (Signed)
History and Physical    INIOLUWA BARIS Griffith:[id number] DOB: 1941/01/06 DOA: 10/30/2021  Referring MD/NP/PA:   PCP: [name]   Patient coming from:  The patient is coming from home.  At baseline, pt is independent for most of ADL.        Chief Complaint: Multiple falls  HPI: Justin Griffith is a 81 y.o. male with medical history significant of hypertension, hyperlipidemia, diabetes mellitus, COPD/asthma, stroke, depression, OSA, colon cancer, BPH with urinary retention with chronic indwelling catheter placement, PE on Eliquis, obesity, CAD, CABG, CKD-3B, dCHF, PAD, who presents with multiple falls.  Pt has history of intermittent fall chronically. He fell 3 times yesterday without significant injury.  No loss of consciousness.  He does not realize unilateral weakness in the extremities, but on my examination, I found that the patient has right-sided weakness in both arm and leg.  Does not have slurred speech or facial droop.  Patient has chronic severe hearing loss.  No vision loss.  Patient denies chest pain, cough, shortness breath.  No fever or chills.  Denies nausea, vomiting, diarrhea or abdominal pain.  No symptoms of UTI. He endorses a mild headache as well as some soreness in both knees and ankles.  He denies any back pain.   Data reviewed independently and ED Course: pt was found to have WBC 7.5, urinalysis (hazy appearance, trace amount of leukocyte, rare bacteria, WBC 0-5), troponin level 18, 16, renal function close to baseline, temperature normal, blood pressure 150/75, 196/86, heart rate 49-63, RR 21, oxygen saturation 97% on room air.  Chest x-ray negative.  X-ray negative for bony fracture in both knees and ankles. CT-head negative for acute intracranial abnormalities.  CT of the C-spine is negative for bony fracture, but showed degenerative disc disease.  CT of maxillofacial image is negative for bony fracture.  MRI for brain showed subacute stroke.  Patient is  admitted to telemetry bed as inpatient.  Dr. Leonel Ramsay of neurology is consulted.   MRI-brain: 1. 1.3 cm focus of mild diffusion signal abnormality involving the deep white matter of the left frontal corona radiata, likely an evolving subacute small vessel infarct. No associated hemorrhage or mass effect. 2. Additional 4 mm focus of possible restricted diffusion involving the mesial right temporal lobe, suspicious for an additional small acute to early subacute nonhemorrhagic ischemic infarct. 3. Underlying age-related cerebral atrophy with moderate chronic microvascular ischemic disease. 4. Remote left PICA distribution infarct.   Carotid artery Doppler: Bilateral carotid atherosclerosis. Negative for significant stenosis. Degree of narrowing less than 50% bilaterally by ultrasound criteria. Patent antegrade vertebral flow bilaterally    EKG: I have personally reviewed.  Sinus rhythm, borderline LAD, QTc 463, poor R wave progression   Review of Systems:   General: no fevers, chills, no body weight gain, has fatigue HEENT: no blurry vision, sore throat, has hearing loss Respiratory: no dyspnea, coughing, wheezing CV: no chest pain, no palpitations GI: no nausea, vomiting, abdominal pain, diarrhea, constipation GU: no dysuria, burning on urination, increased urinary frequency, hematuria  Ext: no leg edema Neuro: no vision loss.  Has right-sided weakness, has multiple focal Skin: no rash, no skin tear. MSK: No muscle spasm, no deformity, no limitation of range of movement in spin Heme: No easy bruising.  Travel history: No recent long distant travel.   Allergy:  Allergies  Allergen Reactions   Farxiga [Dapagliflozin] Rash    Causes a severe rash in groin area    Dulaglutide Diarrhea and Other (  See Comments)   Liraglutide Diarrhea and Other (See Comments)   Jardiance [Empagliflozin] Rash    Past Medical History:  Diagnosis Date   Acute urinary retention     Arthritis    Asthma 2000   Back pain    Colon polyp 2012   COPD (chronic obstructive pulmonary disease) (HCC)    Emphysema of lung (HCC)    Heart murmur    Hernia 2000   Hyperlipidemia    Hypertension    Personal history of malignant neoplasm of large intestine    Personal history of tobacco use, presenting hazards to health    Sleep apnea    Special screening for malignant neoplasms, colon    Type 2 diabetes mellitus with proteinuria ([place]) 10/11/2014    Past Surgical History:  Procedure Laterality Date   CARDIAC CATHETERIZATION Left 09/25/2015   Procedure: Left Heart Cath and Coronary Angiography;  Surgeon: Yolonda Kida, MD;  Location: [facility];  Service: Cardiovascular;  Laterality: Left;   CHOLECYSTECTOMY  1970   COLON SURGERY  07-16-99   sigmoid colon resection with primary anastomosis, chemotherapy for metastatic disease   COLONOSCOPY  2001, 2012   Dr Bary Castilla, tubular adenoma of the cecum and ascending colon in 2012.   COLONOSCOPY WITH PROPOFOL N/A 02/12/2016   Procedure: COLONOSCOPY WITH PROPOFOL;  Surgeon: Robert Bellow, MD;  Location: [facility] ENDOSCOPY;  Service: Endoscopy;  Laterality: N/A;   CORONARY STENT INTERVENTION N/A 05/17/2020   Procedure: CORONARY STENT INTERVENTION;  Surgeon: Yolonda Kida, MD;  Location: [facility];  Service: Cardiovascular;  Laterality: N/A;  LAD   ESOPHAGOGASTRODUODENOSCOPY (EGD) WITH PROPOFOL N/A 02/12/2016   Procedure: ESOPHAGOGASTRODUODENOSCOPY (EGD) WITH PROPOFOL;  Surgeon: Robert Bellow, MD;  Location: [facility] ENDOSCOPY;  Service: Endoscopy;  Laterality: N/A;   HERNIA REPAIR Right    right inguinal hernia repair   JOINT REPLACEMENT Bilateral    knee replacement   KNEE SURGERY Bilateral 2010   LEFT HEART CATH AND CORONARY ANGIOGRAPHY N/A 05/17/2020   Procedure: LEFT HEART CATH AND CORONARY ANGIOGRAPHY possible PCI and stent;  Surgeon: Yolonda Kida, MD;  Location: [facility];  Service:  Cardiovascular;  Laterality: N/A;   MYRINGOTOMY WITH TUBE PLACEMENT Bilateral 08/16/2014   Procedure: MYRINGOTOMY WITH TUBE PLACEMENT;  Surgeon: Carloyn Manner, MD;  Location: [facility];  Service: ENT;  Laterality: Bilateral;   PERIPHERAL VASCULAR CATHETERIZATION Right 09/04/2015   Procedure: Lower Extremity Angiography;  Surgeon: Katha Cabal, MD;  Location: [facility];  Service: Cardiovascular;  Laterality: Right;   PERIPHERAL VASCULAR CATHETERIZATION  09/04/2015   Procedure: Lower Extremity Intervention;  Surgeon: Katha Cabal, MD;  Location: [facility];  Service: Cardiovascular;;   TONSILLECTOMY     TYMPANOSTOMY TUBE PLACEMENT      Social History:  reports that he quit smoking about 32 years ago. His smoking use included cigarettes. He has a 30.00 pack-year smoking history. He has been exposed to tobacco smoke. He has never used smokeless tobacco. He reports that he does not drink alcohol and does not use drugs.  Family History:  Family History  Problem Relation Age of Onset   Diabetes Father    Esophageal cancer Mother    Alzheimer's disease Paternal Uncle      Prior to Admission medications   Medication Sig Start Date End Date Taking? Authorizing Provider  apixaban (ELIQUIS) 5 MG TABS tablet Take 1 tablet (5 mg total) by mouth 2 (two) times daily.  10/14/21  Yes Mecum, Erin E, PA-C  BREZTRI AEROSPHERE 160-9-4.8 MCG/ACT AERO Inhale 1 puff into the lungs daily. 08/19/21  Yes [provider]  carvedilol (COREG) 25 MG tablet Take 25 mg by mouth. 12/16/20  Yes [provider]  cloNIDine (CATAPRES) 0.1 MG tablet Take 0.1 mg by mouth 2 (two) times daily. 12/16/20  Yes [provider]  fenofibrate (TRICOR) 145 MG tablet Take 1 tablet (145 mg total) by mouth daily. 09/11/21  Yes Vigg, Avanti, MD  gabapentin (NEURONTIN) 600 MG tablet Take 600 mg by mouth 2 (two) times daily. 10/27/21  Yes [provider]  hydrALAZINE (APRESOLINE) 50 MG  tablet TAKE ONE TABLET BY MOUTH THREE TIMES A DAY FOR BLOOD PRESSURE 12/16/20  Yes [provider]  insulin glargine-yfgn (SEMGLEE) 100 UNIT/ML Pen INJECT 10 UNITS UNDER SKIN ONCE EVERY DAY FOR DIABETES DISCARD PEN 28 DAYS AFTER OPENING. * GIVE AT THE SAME TIME DAILY * 08/04/21  Yes [provider]  lidocaine (LIDODERM) 5 % Place 1 patch onto the skin daily. Remove & Discard patch within 12 hours or as directed by MD 10/15/21  Yes [name], Jolene T, NP  losartan (COZAAR) 100 MG tablet Take 100 mg by mouth daily. 09/25/20  Yes [provider]  magnesium oxide (MAG-OX) 400 (240 Mg) MG tablet Take 1 tablet (400 mg total) by mouth daily. 06/27/21  Yes Lorella Nimrod, MD  metFORMIN (GLUCOPHAGE) 1000 MG tablet Take 1 tablet (1,000 mg total) by mouth 2 (two) times daily with a meal. 10/16/20 10/31/21 Yes Vigg, Avanti, MD  mirabegron ER (MYRBETRIQ) 50 MG TB24 tablet Take 1 tablet (50 mg total) by mouth daily. 10/06/21  Yes Vaillancourt, Aldona Bar, PA-C  nortriptyline (PAMELOR) 10 MG capsule Take 10 mg by mouth 2 (two) times daily. 09/01/21  Yes [provider]  rosuvastatin (CRESTOR) 40 MG tablet Take 1 tablet (40 mg total) by mouth at bedtime. 09/11/21  Yes Vigg, Avanti, MD  Tiotropium Bromide Monohydrate (SPIRIVA RESPIMAT) 2.5 MCG/ACT AERS Inhale 2 puffs into the lungs daily. 05/15/20  Yes Vigg, Avanti, MD  Torsemide 40 MG TABS Take 80 mg by mouth 2 (two) times daily. 04/14/21 04/14/22 Yes [provider]  vitamin B-12 (CYANOCOBALAMIN) 500 MCG tablet Take 2,000 mcg by mouth at bedtime.   Yes [provider]  Vitamin D, Cholecalciferol, 25 MCG (1000 UT) CAPS Take 2,000 mcg by mouth at bedtime.   Yes [provider]  amoxicillin (AMOXIL) 500 MG capsule Take 1 capsule (500 mg total) by mouth every 8 (eight) hours. Patient not taking: Reported on 10/31/2021 10/13/21   Wouk, Ailene Rud, MD  ciprofloxacin (CIPRO) 500 MG tablet Take 1 tablet (500 mg total) by mouth 2  (two) times daily. Patient not taking: Reported on 10/31/2021 10/13/21   Gwynne Edinger, MD  oxybutynin (DITROPAN) 5 MG tablet Take by mouth. Patient not taking: Reported on 10/31/2021 10/13/21   [provider]    Physical Exam: Vitals:   10/31/21 1502 10/31/21 1530 10/31/21 1600 10/31/21 1730  BP:  137/71 (!) 152/68 (!) 148/68  Pulse:  (!) 55 (!) 55 (!) 58  Resp:  '16 17 18  '$ Temp: 98.9 F (37.2 C)     TempSrc: Oral     SpO2:  96% 96% 97%  Weight:      Height:       General: Not in acute distress HEENT:       Eyes: PERRL, EOMI, no scleral icterus.  ENT: No discharge from the ears and nose, no pharynx injection, no tonsillar enlargement.        Neck: No JVD, no bruit, no mass felt. Heme: No neck lymph node enlargement. Cardiac: S1/S2, RRR, No gallops or rubs. Respiratory: No rales, wheezing, rhonchi or rubs. GI: Soft, nondistended, nontender, no rebound pain, no organomegaly, BS present. GU: No hematuria. Has indwelling foley in place  Ext: No pitting leg edema bilaterally. 1+DP/PT pulse bilaterally. Musculoskeletal: No joint deformities, No joint redness or warmth, no limitation of ROM in spin. Skin: No rashes.  Neuro: Alert, oriented X3, cranial nerves II-XII grossly intact. muscle strength 2/5 right leg and 3/5 in right arm, 5/5 in left extremities, sensation to light touch intact. Psych: Patient is not psychotic, no suicidal or hemocidal ideation.  Labs on Admission: I have personally reviewed following labs and imaging studies  CBC: Recent Labs  Lab 10/30/21 2018  WBC 7.5  HGB 12.8*  HCT 40.0  MCV 91.7  PLT 557   Basic Metabolic Panel: Recent Labs  Lab 10/30/21 2018 10/31/21 0003  NA 137  --   K 4.2  --   CL 107  --   CO2 25  --   GLUCOSE 148*  --   BUN 21  --   CREATININE 1.65*  --   CALCIUM 10.1  --   MG  --  1.9   GFR: Estimated Creatinine Clearance: 45.8 mL/min (A) (by C-G formula based on SCr of 1.65 mg/dL (H)). Liver Function  Tests: Recent Labs  Lab 10/30/21 2049  AST 16  ALT 15  ALKPHOS 54  BILITOT 0.9  PROT 7.0  ALBUMIN 3.3*   No results for input(s): "LIPASE", "AMYLASE" in the last 168 hours. No results for input(s): "AMMONIA" in the last 168 hours. Coagulation Profile: No results for input(s): "INR", "PROTIME" in the last 168 hours. Cardiac Enzymes: No results for input(s): "CKTOTAL", "CKMB", "CKMBINDEX", "TROPONINI" in the last 168 hours. BNP (last 3 results) No results for input(s): "PROBNP" in the last 8760 hours. HbA1C: Recent Labs    10/30/21 2039  HGBA1C 8.1*   CBG: Recent Labs  Lab 10/31/21 1218 10/31/21 1631  GLUCAP 181* 175*   Lipid Profile: No results for input(s): "CHOL", "HDL", "LDLCALC", "TRIG", "CHOLHDL", "LDLDIRECT" in the last 72 hours. Thyroid Function Tests: Recent Labs    10/31/21 0003  TSH 2.134   Anemia Panel: No results for input(s): "VITAMINB12", "FOLATE", "FERRITIN", "TIBC", "IRON", "RETICCTPCT" in the last 72 hours. Urine analysis:    Component Value Date/Time   COLORURINE YELLOW (A) 10/31/2021 0003   APPEARANCEUR HAZY (A) 10/31/2021 0003   APPEARANCEUR Cloudy (A) 09/18/2021 1421   LABSPEC 1.005 10/31/2021 0003   PHURINE 5.0 10/31/2021 0003   GLUCOSEU NEGATIVE 10/31/2021 0003   HGBUR LARGE (A) 10/31/2021 0003   BILIRUBINUR NEGATIVE 10/31/2021 0003   BILIRUBINUR Negative 09/18/2021 1421   KETONESUR NEGATIVE 10/31/2021 0003   PROTEINUR 100 (A) 10/31/2021 0003   NITRITE NEGATIVE 10/31/2021 0003   LEUKOCYTESUR TRACE (A) 10/31/2021 0003   Sepsis Labs: '@LABRCNTIP'$ (procalcitonin:4,lacticidven:4) )No results found for this or any previous visit (from the past 240 hour(s)).   Radiological Exams on Admission: US Carotid Bilateral (at [facility] and [facility] only)  Result Date: 10/31/2021 CLINICAL DATA:  Stroke symptoms, hypertension, hyperlipidemia and diabetes EXAM: BILATERAL CAROTID DUPLEX ULTRASOUND TECHNIQUE: Pearline Cables scale imaging, color Doppler and duplex  ultrasound were performed of bilateral carotid and vertebral arteries in the neck. COMPARISON:  None Available. FINDINGS: Criteria: Quantification of carotid  stenosis is based on velocity parameters that correlate the residual internal carotid diameter with NASCET-based stenosis levels, using the diameter of the distal internal carotid lumen as the denominator for stenosis measurement. The following velocity measurements were obtained: RIGHT ICA: 62/16 cm/sec CCA: 35/70 cm/sec SYSTOLIC ICA/CCA RATIO:  1.5 ECA: 149 cm/sec LEFT ICA: 69/19 cm/sec CCA: 17/79 cm/sec SYSTOLIC ICA/CCA RATIO:  1.0 ECA: 97 cm/sec RIGHT CAROTID ARTERY: Intimal thickening and mild bifurcation calcified plaque formation. Negative for significant stenosis, velocity elevation, turbulent flow. Degree of narrowing less than 50% by ultrasound criteria. RIGHT VERTEBRAL ARTERY:  Normal antegrade flow LEFT CAROTID ARTERY: Similar bifurcation calcified atherosclerosis. Despite this, no significant stenosis, velocity elevation, turbulent flow. Degree of narrowing also less than 50% by ultrasound criteria. LEFT VERTEBRAL ARTERY:  Normal antegrade flow IMPRESSION: Bilateral carotid atherosclerosis. Negative for significant stenosis. Degree of narrowing less than 50% bilaterally by ultrasound criteria. Patent antegrade vertebral flow bilaterally Electronically Signed   By: Jerilynn Mages.  Shick M.D.   On: 10/31/2021 11:27   MR BRAIN WO CONTRAST  Result Date: 10/31/2021 CLINICAL DATA:  Initial evaluation for acute neuro deficit, stroke. EXAM: MRI HEAD WITHOUT CONTRAST TECHNIQUE: Multiplanar, multiecho pulse sequences of the brain and surrounding structures were obtained without intravenous contrast. COMPARISON:  CT from 10/30/2021. FINDINGS: Brain: Examination mildly degraded by motion artifact. Diffuse prominence of the CSF containing spaces compatible generalized cerebral atrophy. Patchy and confluent T2/FLAIR hyperintensity involving the periventricular and deep  white matter both cerebral hemispheres, most consistent with chronic small vessel ischemic disease, moderately advanced in nature. Mild involvement of the pons noted. Encephalomalacia of involving the inferior left cerebellum consistent with a remote left PICA distribution infarct. Approximate 1.3 cm area of mild diffusion signal abnormality involving the deep white matter of the left frontal corona radiata is seen (series 5, image 31). Associated T2/FLAIR signal intensity without convincing ADC correlate. This likely reflects an evolving subacute small vessel infarct. No associated hemorrhage or mass effect. Additionally, there is a possible 4 mm focus of restricted diffusion involving the mesial right temporal lobe, seen only on coronal DWI sequence (series 7, image 23), which could reflect an additional small acute to early subacute ischemic infarct. No associated hemorrhage or mass effect. No other evidence for acute or subacute ischemia. Gray-white matter differentiation otherwise maintained. No other significant acute or chronic intracranial blood products. No mass lesion, midline shift or mass effect. Mild ventricular prominence related to global parenchymal volume loss without hydrocephalus. No extra-axial fluid collection. Pituitary gland and suprasellar region within normal limits. Vascular: Major intracranial vascular flow voids are maintained. Skull and upper cervical spine: Craniocervical junction within normal limits. Bone marrow signal intensity grossly normal. No scalp soft tissue abnormality. Sinuses/Orbits: Prior bilateral ocular lens replacement. Paranasal sinuses are largely clear. Small chronic appearing bilateral mastoid effusions. Visualized nasopharynx unremarkable. Other: None. IMPRESSION: 1. 1.3 cm focus of mild diffusion signal abnormality involving the deep white matter of the left frontal corona radiata, likely an evolving subacute small vessel infarct. No associated hemorrhage or mass  effect. 2. Additional 4 mm focus of possible restricted diffusion involving the mesial right temporal lobe, suspicious for an additional small acute to early subacute nonhemorrhagic ischemic infarct. 3. Underlying age-related cerebral atrophy with moderate chronic microvascular ischemic disease. 4. Remote left PICA distribution infarct. Electronically Signed   By: Jeannine Boga M.D.   On: 10/31/2021 03:03   DG Knee Complete 4 Views Right  Result Date: 10/31/2021 CLINICAL DATA:  Fall injury with bilateral knee and bilateral  ankle pain and chest discomfort. EXAM: RIGHT KNEE - COMPLETE 4+ VIEW; CHEST - 2 VIEW; LEFT ANKLE COMPLETE - 3+ VIEW; RIGHT ANKLE - COMPLETE 3+ VIEW; LEFT KNEE - COMPLETE 4+ VIEW COMPARISON:  Portable chest 09/19/2021, bilateral knee series 07/29/2006. No prior ankle series either side. FINDINGS: Chest: Mild cardiomegaly without evidence of CHF. Stable mediastinal silhouette with slight aortic tortuosity, calcification in the arch. The lungs are clear. No pleural effusion is seen. Moderate thoracic spondylosis. Right knee: Normal bone mineralization.  No fracture or dislocation is seen. Small suprapatellar effusion is noted and a total knee arthroplasty without evidence of loosening. There are multiple small ossific rounded loose bodies along the joint margins versus extra-articular bony debris, not seen on the prior postoperative study. There is mild soft tissue swelling anteriorly. Patchy calcification newly noted in the distal femoral artery, popliteal and popliteal trifurcation arteries. Left knee: There is normal bone mineralization without evidence of fractures. Small suprapatellar effusion is seen. Total joint arthroplasty is intact without evidence of loosening. There are multiple small rounded ossific bodies scattered around the periphery of the joint space versus extra-articular ossific debris not seen previously. The superficial soft tissues are unremarkable. Patchy  calcification developed since the prior study in the distal femoral, popliteal and popliteal trifurcation arteries. Right ankle: There is mild to moderate swelling. There are calcifications in the anterior and posterior tibial arteries. Normal bone mineralization without evidence of acute fracture. The mortise is symmetric but there is spurring of the undersurface of both malleoli. There is an ovoid ossicle inferior to the medial malleolus which could be an old chip fracture, intra-articular loose body or unfused ossification center. 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 No proximal metatarsal fracture seen. Left ankle: There is mild generalized soft tissue fullness. There are calcifications in the anterior and posterior tibial arteries. 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 The mortise is symmetric. There is slight spurring of the undersurface of both malleoli. Bony debris is seen inferior to the lateral malleolus with chronic appearance and could be dystrophic calcifications from remote trauma or loose bodies. 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 The proximal metatarsals are intact as visualized. IMPRESSION: 1. No evidence of acute chest disease. Stable chest with mild cardiomegaly and aortic atherosclerosis. 2. No evidence of acute fractures of the bilateral knees and bilateral ankles. 3. Anterior swelling of the right knee and bilateral swelling at the ankles right-greater-than-left. 4. Small bilateral suprapatellar effusions. 5. Postsurgical and degenerative changes described above. 6. Interval development of vascular calcifications compared with 15 years ago. Electronically Signed   By: Telford Nab M.D.   On: 10/31/2021 01:03   DG Knee Complete 4 Views Left  Result Date:  10/31/2021 CLINICAL DATA:  Fall injury with bilateral knee and bilateral ankle pain and chest discomfort. EXAM: RIGHT KNEE - COMPLETE 4+ VIEW; CHEST - 2 VIEW; LEFT ANKLE COMPLETE - 3+ VIEW; RIGHT ANKLE - COMPLETE 3+ VIEW; LEFT KNEE - COMPLETE 4+ VIEW COMPARISON:  Portable chest 09/19/2021, bilateral knee series 07/29/2006. No prior ankle series either side. FINDINGS: Chest: Mild cardiomegaly without evidence of CHF. Stable mediastinal silhouette with slight aortic tortuosity, calcification in the arch. The lungs are clear. No pleural effusion is seen. Moderate thoracic spondylosis. Right knee: Normal bone mineralization.  No fracture or dislocation is seen. Small suprapatellar effusion is noted  and a total knee arthroplasty without evidence of loosening. There are multiple small ossific rounded loose bodies along the joint margins versus extra-articular bony debris, not seen on the prior postoperative study. There is mild soft tissue swelling anteriorly. Patchy calcification newly noted in the distal femoral artery, popliteal and popliteal trifurcation arteries. Left knee: There is normal bone mineralization without evidence of fractures. Small suprapatellar effusion is seen. Total joint arthroplasty is intact without evidence of loosening. There are multiple small rounded ossific bodies scattered around the periphery of the joint space versus extra-articular ossific debris not seen previously. The superficial soft tissues are unremarkable. Patchy calcification developed since the prior study in the distal femoral, popliteal and popliteal trifurcation arteries. Right ankle: There is mild to moderate swelling. There are calcifications in the anterior and posterior tibial arteries. Normal bone mineralization without evidence of acute fracture. The mortise is symmetric but there is spurring of the undersurface of both malleoli. There is an ovoid ossicle inferior to the medial malleolus which could be an old chip  fracture, intra-articular loose body or unfused ossification center. 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 No proximal metatarsal fracture seen. Left ankle: There is mild generalized soft tissue fullness. There are calcifications in the anterior and posterior tibial arteries. 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 The mortise is symmetric. There is slight spurring of the undersurface of both malleoli. Bony debris is seen inferior to the lateral malleolus with chronic appearance and could be dystrophic calcifications from remote trauma or loose bodies. 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 The proximal metatarsals are intact as visualized. IMPRESSION: 1. No evidence of acute chest disease. Stable chest with mild cardiomegaly and aortic atherosclerosis. 2. No evidence of acute fractures of the bilateral knees and bilateral ankles. 3. Anterior swelling of the right knee and bilateral swelling at the ankles right-greater-than-left. 4. Small bilateral suprapatellar effusions. 5. Postsurgical and degenerative changes described above. 6. Interval development of vascular calcifications compared with 15 years ago. Electronically Signed   By: Telford Nab M.D.   On: 10/31/2021 01:03   DG Ankle Complete Right  Result Date: 10/31/2021 CLINICAL DATA:  Fall injury with bilateral knee and bilateral ankle pain and chest discomfort. EXAM: RIGHT KNEE - COMPLETE 4+ VIEW; CHEST - 2 VIEW; LEFT ANKLE COMPLETE - 3+ VIEW; RIGHT ANKLE - COMPLETE 3+ VIEW; LEFT KNEE - COMPLETE 4+ VIEW COMPARISON:  Portable chest 09/19/2021, bilateral knee series 07/29/2006. No prior ankle series either side. FINDINGS: Chest: Mild cardiomegaly without evidence of CHF. Stable mediastinal silhouette with slight aortic tortuosity,  calcification in the arch. The lungs are clear. No pleural effusion is seen. Moderate thoracic spondylosis. Right knee: Normal bone mineralization.  No fracture or dislocation is seen. Small suprapatellar effusion is noted and a total knee arthroplasty without evidence of loosening. There are multiple small ossific rounded loose bodies along the joint margins versus extra-articular bony debris, not seen on the prior postoperative study. There is mild soft tissue swelling anteriorly. Patchy calcification newly noted in the distal femoral artery, popliteal and popliteal trifurcation arteries. Left knee: There is normal bone mineralization without evidence of fractures. Small suprapatellar effusion is seen. Total joint arthroplasty is intact without evidence of loosening. There are multiple small rounded ossific bodies scattered around the periphery of the joint space versus extra-articular ossific debris not seen  previously. The superficial soft tissues are unremarkable. Patchy calcification developed since the prior study in the distal femoral, popliteal and popliteal trifurcation arteries. Right ankle: There is mild to moderate swelling. There are calcifications in the anterior and posterior tibial arteries. Normal bone mineralization without evidence of acute fracture. The mortise is symmetric but there is spurring of the undersurface of both malleoli. There is an ovoid ossicle inferior to the medial malleolus which could be an old chip fracture, intra-articular loose body or unfused ossification center. 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 No proximal metatarsal fracture seen. Left ankle: There is mild generalized soft tissue fullness. There are calcifications in the anterior and posterior tibial arteries. 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 The mortise is  symmetric. There is slight spurring of the undersurface of both malleoli. Bony debris is seen inferior to the lateral malleolus with chronic appearance and could be dystrophic calcifications from remote trauma or loose bodies. 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 The proximal metatarsals are intact as visualized. IMPRESSION: 1. No evidence of acute chest disease. Stable chest with mild cardiomegaly and aortic atherosclerosis. 2. No evidence of acute fractures of the bilateral knees and bilateral ankles. 3. Anterior swelling of the right knee and bilateral swelling at the ankles right-greater-than-left. 4. Small bilateral suprapatellar effusions. 5. Postsurgical and degenerative changes described above. 6. Interval development of vascular calcifications compared with 15 years ago. Electronically Signed   By: Telford Nab M.D.   On: 10/31/2021 01:03   DG Ankle Complete Left  Result Date: 10/31/2021 CLINICAL DATA:  Fall injury with bilateral knee and bilateral ankle pain and chest discomfort. EXAM: RIGHT KNEE - COMPLETE 4+ VIEW; CHEST - 2 VIEW; LEFT ANKLE COMPLETE - 3+ VIEW; RIGHT ANKLE - COMPLETE 3+ VIEW; LEFT KNEE - COMPLETE 4+ VIEW COMPARISON:  Portable chest 09/19/2021, bilateral knee series 07/29/2006. No prior ankle series either side. FINDINGS: Chest: Mild cardiomegaly without evidence of CHF. Stable mediastinal silhouette with slight aortic tortuosity, calcification in the arch. The lungs are clear. No pleural effusion is seen. Moderate thoracic spondylosis. Right knee: Normal bone mineralization.  No fracture or dislocation is seen. Small suprapatellar effusion is noted and a total knee arthroplasty without evidence of loosening. There are multiple small ossific rounded loose bodies along the joint margins versus extra-articular bony debris, not seen on the prior postoperative study. There is mild soft tissue swelling anteriorly.  Patchy calcification newly noted in the distal femoral artery, popliteal and popliteal trifurcation arteries. Left knee: There is normal bone mineralization without evidence of fractures. Small suprapatellar effusion is seen. Total joint arthroplasty is intact without evidence of loosening. There are multiple small rounded ossific bodies scattered around the periphery of the joint space versus extra-articular ossific debris not seen previously. The superficial soft tissues are unremarkable. Patchy calcification developed since the prior study in the distal femoral, popliteal and popliteal trifurcation arteries. Right ankle: There is mild to moderate swelling. There are calcifications in the anterior and posterior tibial arteries. Normal bone mineralization without evidence of acute fracture. The mortise is symmetric but there is spurring of the undersurface of both malleoli. There is an ovoid ossicle inferior to the medial malleolus which could be an old chip fracture, intra-articular loose body or unfused ossification center. There is moderate midfoot arthrosis noted, prominent posterior and moderate sized plantar  calcaneal enthesopathy. No proximal metatarsal fracture seen. Left ankle: There is mild generalized soft tissue fullness. There are calcifications in the anterior and posterior tibial arteries. 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 The mortise is symmetric. There is slight spurring of the undersurface of both malleoli. Bony debris is seen inferior to the lateral malleolus with chronic appearance and could be dystrophic calcifications from remote trauma or loose bodies. 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 The proximal metatarsals are intact as visualized. IMPRESSION: 1. No evidence of acute chest disease. Stable chest with mild cardiomegaly and aortic  atherosclerosis. 2. No evidence of acute fractures of the bilateral knees and bilateral ankles. 3. Anterior swelling of the right knee and bilateral swelling at the ankles right-greater-than-left. 4. Small bilateral suprapatellar effusions. 5. Postsurgical and degenerative changes described above. 6. Interval development of vascular calcifications compared with 15 years ago. Electronically Signed   By: Telford Nab M.D.   On: 10/31/2021 01:03   DG Chest 2 View  Result Date: 10/31/2021 CLINICAL DATA:  Fall injury with bilateral knee and bilateral ankle pain and chest discomfort. EXAM: RIGHT KNEE - COMPLETE 4+ VIEW; CHEST - 2 VIEW; LEFT ANKLE COMPLETE - 3+ VIEW; RIGHT ANKLE - COMPLETE 3+ VIEW; LEFT KNEE - COMPLETE 4+ VIEW COMPARISON:  Portable chest 09/19/2021, bilateral knee series 07/29/2006. No prior ankle series either side. FINDINGS: Chest: Mild cardiomegaly without evidence of CHF. Stable mediastinal silhouette with slight aortic tortuosity, calcification in the arch. The lungs are clear. No pleural effusion is seen. Moderate thoracic spondylosis. Right knee: Normal bone mineralization.  No fracture or dislocation is seen. Small suprapatellar effusion is noted and a total knee arthroplasty without evidence of loosening. There are multiple small ossific rounded loose bodies along the joint margins versus extra-articular bony debris, not seen on the prior postoperative study. There is mild soft tissue swelling anteriorly. Patchy calcification newly noted in the distal femoral artery, popliteal and popliteal trifurcation arteries. Left knee: There is normal bone mineralization without evidence of fractures. Small suprapatellar effusion is seen. Total joint arthroplasty is intact without evidence of loosening. There are multiple small rounded ossific bodies scattered around the periphery of the joint space versus extra-articular ossific debris not seen previously. The superficial soft tissues are unremarkable.  Patchy calcification developed since the prior study in the distal femoral, popliteal and popliteal trifurcation arteries. Right ankle: There is mild to moderate swelling. There are calcifications in the anterior and posterior tibial arteries. Normal bone mineralization without evidence of acute fracture. The mortise is symmetric but there is spurring of the undersurface of both malleoli. There is an ovoid ossicle inferior to the medial malleolus which could be an old chip fracture, intra-articular loose body or unfused ossification center. 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 No proximal metatarsal fracture seen. Left ankle: There is mild generalized soft tissue fullness. There are calcifications in the anterior and posterior tibial arteries. 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 The mortise is symmetric. There is slight spurring of the undersurface of both malleoli. Bony debris is seen inferior to the lateral malleolus with chronic appearance and could be dystrophic calcifications from remote trauma or loose bodies. 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 The proximal  metatarsals are intact as visualized. IMPRESSION: 1. No evidence of acute chest disease. Stable chest with mild cardiomegaly and aortic atherosclerosis. 2. No evidence of acute fractures of the bilateral knees and bilateral ankles. 3. Anterior swelling of the right knee and bilateral swelling at the ankles right-greater-than-left. 4. Small bilateral suprapatellar effusions. 5. Postsurgical and degenerative changes described above. 6. Interval development of vascular calcifications compared with 15 years ago. Electronically Signed   By: Telford Nab M.D.   On: 10/31/2021 01:03   CT Maxillofacial Wo Contrast  Result  Date: 10/30/2021 CLINICAL DATA:  Facial trauma.  Fall.  Patient on Eliquis. EXAM: CT MAXILLOFACIAL WITHOUT CONTRAST TECHNIQUE: Multidetector CT imaging of the maxillofacial structures was performed. Multiplanar CT image reconstructions were also generated. RADIATION DOSE REDUCTION: This exam was performed according to the departmental dose-optimization program which includes automated exposure control, adjustment of the mA and/or kV according to patient size and/or use of iterative reconstruction technique. COMPARISON:  CT head without contrast 09/19/2021 FINDINGS: Osseous: No acute fractures are present. The mandible is intact and located. The patient is edentulous. Multilevel degenerative changes are again noted in the upper cervical spine. No acute fracture is present. Orbits: Bilateral lens replacements are noted. Globes and orbits are otherwise unremarkable. Sinuses: The paranasal sinuses and mastoid air cells are clear. Soft tissues: Supraorbital soft tissue swelling is present bilaterally. No underlying fracture is present. No foreign body is present. Limited intracranial: Moderate generalized atrophy and white matter disease is stable. IMPRESSION: 1. Supraorbital soft tissue swelling bilaterally without underlying fracture. 2. No other acute trauma to the face. 3. Stable atrophy and white matter disease. 4. Multilevel degenerative changes in the upper cervical spine. Electronically Signed   By: San Morelle M.D.   On: 10/30/2021 20:48   CT Cervical Spine Wo Contrast  Result Date: 10/30/2021 CLINICAL DATA:  Trauma, fall EXAM: CT CERVICAL SPINE WITHOUT CONTRAST TECHNIQUE: Multidetector CT imaging of the cervical spine was performed without intravenous contrast. Multiplanar CT image reconstructions were also generated. RADIATION DOSE REDUCTION: This exam was performed according to the departmental dose-optimization program which includes automated exposure control, adjustment of the mA and/or kV  according to patient size and/or use of iterative reconstruction technique. COMPARISON:  None Available. FINDINGS: Alignment: Alignment of the posterior margin of vertebral bodies is unremarkable. Skull base and vertebrae: No recent fracture is seen. Degenerative changes are noted. Soft tissues and spinal canal: There is extrinsic pressure over the ventral margin of thecal sac caused by posterior bony spurs and bulging of the annulus with mild-to-moderate spinal stenosis, more so at C4-C5 and C5-C6 levels. Disc levels: There is encroachment of neural foramina from C2 to C7 levels. Upper chest: Unremarkable. Other: Unremarkable. IMPRESSION: No recent fracture is seen in cervical spine. Cervical spondylosis with encroachment of neural foramina from C2-C7 levels. Posterior bony spurs are causing extrinsic pressure over the ventral margin of thecal sac with spinal stenosis, more so at C4-C5 and C5-C6 levels. Electronically Signed   By: Elmer Picker M.D.   On: 10/30/2021 20:47   CT HEAD WO CONTRAST (5MM)  Result Date: 10/30/2021 CLINICAL DATA:  Trauma, fall, altered mental status EXAM: CT HEAD WITHOUT CONTRAST TECHNIQUE: Contiguous axial images were obtained from the base of the skull through the vertex without intravenous contrast. RADIATION DOSE REDUCTION: This exam was performed according to the departmental dose-optimization program which includes automated exposure control, adjustment of the mA and/or kV according to patient size and/or use of iterative reconstruction technique. COMPARISON:  09/19/2021  FINDINGS: Brain: No acute intracranial findings are seen in noncontrast CT brain. There are no signs of bleeding within the cranium. Cortical sulci are prominent. There is decreased density in periventricular and subcortical white matter. There is encephalomalacia in the posterior left cerebellum with no significant interval change. Vascular: Scattered arterial calcifications are seen. Skull: No fracture is  seen in the calvarium. Sinuses/Orbits: There are no air-fluid levels in paranasal sinuses. There is decrease in number of air cells and fluid attenuation in mastoid air cells on both sides with no significant interval change. Other: No significant interval changes are noted. IMPRESSION: No acute intracranial findings are seen in noncontrast CT brain. Atrophy. Small vessel disease. 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 Electronically Signed   By: Elmer Picker M.D.   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 Leonel Ramsay of neurology.  Patient had 2D echo on 09/20/2021 which showed EF of 60 to 65%, will not repeat 2D echo today.  -Admit to tele med bed as inpatient - Check carotid dopplers  - will continue Eliquis which is for hx of PE  -pt is on fenofibrate and Crestor - will not start ASA since pt is on Eliquis - fasting lipid panel and HbA1c  - swallowing screen. If fails, will get SLP - Check UDS  - PT/OT consult  Recurrent falls Likely due to stroke -PT/OT -Fall precaution  Essential hypertension - IV hydralazine as needed for SBP 220, DBP 110 -Will hold Coreg, hydralazine, Cozaar to allow permissive hypertension -will continue home clonidine 0.1 mg twice daily to avoid rebounding   CAD (coronary artery disease) S/p of CABG.  No  chest pain.  Troponin 18, then 16, which likely nonspecific change. -Continue Crestor, fenofibrate  Chronic obstructive pulmonary disease (COPD) (HCC) Stable.  Oxygen saturation 97% on room air. -Bronchodilators  History of pulmonary embolism 09/18/21 - Continue Eliquis  Type II diabetes mellitus with renal manifestations (HCC) Recent A1c 8.9, poorly controlled.  Patient taking metformin and glargine insulin 10 unit daily -Glargine insulin 7 units daily -Sliding scale insulin  HLD (hyperlipidemia) - Crestor and fenofibrate  Chronic diastolic CHF (congestive heart failure) ([place]) 2D echo on 09/20/2021 showed EF of 60 to 65%.  Patient does not have leg edema JVD.  CHF is compensated. -Continue home torsemide  Stage 3b chronic kidney disease (CKD) ([place]) Renal function close to baseline.  Recent baseline creatinine 1.5-1.85.  His creatinine is 1.65, BUN 21. 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   -Encouraged to lose weight.           DVT ppx: on  Eliquis  Code Status: Full code  Family Communication:  Yes, patient's daughter  by phone  Disposition Plan:  Anticipate discharge back to previous environment  Consults called:  Dr. Leonel Ramsay of neurology  Admission status and Level of care: Telemetry Medical:    as inpt     Severity of Illness:  The appropriate patient status for this patient is INPATIENT. Inpatient status is judged to be reasonable and necessary in order to provide the required intensity of service to ensure the patient's safety. The patient's presenting symptoms, physical exam findings, and initial radiographic and laboratory data in the context of their chronic  comorbidities is felt to place them at high risk for further clinical deterioration. Furthermore, it is not anticipated that the patient will be medically stable for discharge from the hospital within 2 midnights of admission.   * I certify that at the point of admission it is my clinical judgment that the patient will require inpatient hospital care spanning beyond 2 midnights from the point of admission due to high intensity of service, high risk for further deterioration and high frequency of surveillance required.*       Date of Service 10/31/2021    Ivor Costa [facility]   If 7PM-7AM, please contact night-coverage [email or web address] 10/31/2021, 6:42 PM

## 2021-10-31 NOTE — ED Notes (Signed)
Pt eating dinner

## 2021-10-31 NOTE — ED Notes (Addendum)
Pt moved to hospital bed using slide board and 2 staff members, repositioned and given remote. Pt verbalizes increased comfort.

## 2021-10-31 NOTE — ED Notes (Signed)
Attempt made to complete orthostatic vital signs. Pt unable to sit up despite extensive assistance from staff. MD made aware.

## 2021-10-31 NOTE — ED Notes (Signed)
Patient provided with sandwich and diet ginger ale per request. Pt lights dimmed per request.

## 2021-10-31 NOTE — Assessment & Plan Note (Signed)
-   Continue Eliquis 

## 2021-10-31 NOTE — Assessment & Plan Note (Addendum)
-   IV hydralazine as needed for SBP 220, DBP 110 -hold Coreg, hydralazine, Cozaar to allow permissive hypertension --hold torsemide -continue home clonidine 0.1 mg twice daily to avoid rebounding

## 2021-10-31 NOTE — Assessment & Plan Note (Signed)
Stable.  Oxygen saturation 97% on room air. -Bronchodilators

## 2021-10-31 NOTE — Assessment & Plan Note (Addendum)
-  Changed indwelling catheter in ED -Mirabegron

## 2021-10-31 NOTE — Consult Note (Signed)
Neurology Consultation Reason for Consult: Stroke Referring Physician: Mora Bellman  CC: Falls  History is obtained from:Patient  HPI: Justin Griffith is a 81 y.o. male with a history of htn, hyperlipidemia, diabetes who presents with unsteadiness and falls.  He describes that follows up in a longstanding issue, but over the past couple days it has been significantly worse.  He denies any focal weakness, visual change, numbness.  Due to his increased falls, however, he sought care in the emergency department where he was noted to have right-sided weakness.  Due to this an MRI was performed which demonstrates a subcortical infarct on the left.      Past Medical History:  Diagnosis Date   Acute urinary retention    Arthritis    Asthma 2000   Back pain    Colon polyp 2012   COPD (chronic obstructive pulmonary disease) (HCC)    Emphysema of lung (HCC)    Heart murmur    Hernia 2000   Hyperlipidemia    Hypertension    Personal history of malignant neoplasm of large intestine    Personal history of tobacco use, presenting hazards to health    Sleep apnea    Special screening for malignant neoplasms, colon    Type 2 diabetes mellitus with proteinuria ([place]) 10/11/2014     Family History  Problem Relation Age of Onset   Diabetes Father    Esophageal cancer Mother    Alzheimer's disease Paternal Uncle      Social History:  reports that he quit smoking about 32 years ago. His smoking use included cigarettes. He has a 30.00 pack-year smoking history. He has been exposed to tobacco smoke. He has never used smokeless tobacco. He reports that he does not drink alcohol and does not use drugs.   Exam: Current vital signs: BP (!) 161/74 (BP Location: Right Arm)   Pulse (!) 59   Temp 98.4 F (36.9 C)   Resp 18   Ht '5\' 11"'$  (1.803 m)   Wt 117.9 kg   SpO2 95%   BMI 36.26 kg/m  Vital signs in last 24 hours: Temp:  [98.4 F (36.9 C)-98.9 F (37.2 C)] 98.4 F (36.9 C) (08/11 1853) Pulse  Rate:  [49-71] 59 (08/11 1853) Resp:  [10-21] 18 (08/11 1853) BP: (134-208)/(61-100) 161/74 (08/11 1853) SpO2:  [93 %-99 %] 95 % (08/11 1853) Weight:  [117.9 kg] 117.9 kg (08/11 0911)   Physical Exam  Constitutional: Appears well-developed and well-nourished.  Psych: Affect appropriate to situation Eyes: No scleral injection HENT: No OP obstruction MSK: no joint deformities.  Cardiovascular: Normal rate and regular rhythm.  Respiratory: Effort normal, non-labored breathing GI: Soft.  No distension. There is no tenderness.  Skin: WDI  Neuro: Mental Status: Patient is awake, alert, oriented to person, place, month, year, and situation. Patient is able to give a clear and coherent history. No signs of aphasia or neglect Cranial Nerves: II: Visual Fields are full. Pupils are equal, round, and reactive to light.   III,IV, VI: EOMI without ptosis or diploplia.  V: Facial sensation is symmetric to temperature VII: Facial movement with mild nl flattening on left VIII: hearing is intact to voice X: Uvula elevates symmetrically XI: Shoulder shrug is symmetric. XII: tongue is midline without atrophy or fasciculations.  Motor: Tone is normal. Bulk is normal. 5/5 strength was presenton the left, 2/5 right leg and 4-/5 right arm.  Sensory: Sensation is symmetric to light touch and temperature in the arms and  legs. Cerebellar: FNF with ataxia bilaterally      I have reviewed labs in epic and the results pertinent to this consultation are: Creatinine 1.65  I have reviewed the images obtained: MRI brain -extensive chronic ischemic changes including old left cerebellar infarct as well as acute subcortical infarct on the left  Impression: 81 year old male with extensive chronic ischemic changes who presents with likely small vessel infarct on the left.  He is likely to need rehab given the degree of symptoms.  He has a history of PE which is relatively recent and therefore given the  relatively small size of the infarct I think that it is reasonable to continue this.  I would not add antiplatelet therapy while he is on anticoagulation.  Recommendations: 1) continue Eliquis 5 mg twice daily 2) MRA head 3) lipids, A1c 4) PT, OT, st   Roland Rack, MD [facility] [phone]  If 7pm- 7am, please page neurology on call as listed in [place].

## 2021-10-31 NOTE — Evaluation (Signed)
Physical Therapy Evaluation Patient Details Name: JHALEN ELEY MRN: [id number] DOB: Sep 04, 1940 Today's Date: 10/31/2021  History of Present Illness  Daisean Brodhead is an 16yoM who comes to [facility] on 2/24 c substernal CP c raditation to Left shoulder, inittial troponin >27,000. Pt admitted for NSTEMI. PMH: CAD, NSTEMI, HTN, HLD, DM, COPD, CVA, remote tobacco use, colonCA, PVD, tremor. Pt underwent cardiac cath on 2/25, then developed gross hematuria. Afternoon of 2/26 developed AMS, lethargy, hypotension, seens by neurology, concern for potential hospital delerium.   Clinical Impression  Pt admitted with above diagnosis. Pt received upright in ED cot agreeable to PT/OT co eval as nursing reporting difficulty sitting pt up with 1 person assist.  Pt reports being [facility] and does not have his hearing aids. Pt reporting consistent falls 1x/week on average with use of RW and reliant on EMS, neighbor or friend to help him get up. Baseline he is a household ambulator with RW or SPC. Assists wife with bathing tasks PRN and medication management as she has Alzheimer's Dementia.   To date, session limited due to [facility] and high BP ranging on average > 200/90's at rest and sitting. Pt reliant on mod multimodal cuing to assist in positioning to sit EOB with minA of 1 and HOB elevated to attain sitting. Max multimodal cuing required for safe hand positioning on RW due to poor hand placement with initial posterior bias in standing with minGuard to minA+2 to stand. Pt required minA+1 to recover and for VC"s to shift weight anteriorly. Static standing at RW fair after correction of LOB. Pt able to side step to R ~3 steps without notable focal weakness or deficits in LE's. Return to supine in bed with minA at LE's. Pt reports he feels close to baseline with mobility, but due to onset of frequent falls, poor safety awareness and poor use of AD, pt would benefit from STR at d/c to improve stregnth and balance prior to returning home.  All needs in reach. Pt currently with functional limitations due to the deficits listed below (see PT Problem List). Pt will benefit from skilled PT to increase their independence and safety with mobility to allow discharge to the venue listed below.     Recommendations for follow up therapy are one component of a multi-disciplinary discharge planning process, led by the attending physician.  Recommendations may be updated based on patient status, additional functional criteria and insurance authorization.  Follow Up Recommendations Skilled nursing-short term rehab (<3 hours/day) Can patient physically be transported by private vehicle: Yes    Assistance Recommended at Discharge Frequent or constant Supervision/Assistance  Patient can return home with the following  A little help with walking and/or transfers;A lot of help with bathing/dressing/bathroom;Assistance with cooking/housework;Assist for transportation;Help with stairs or ramp for entrance    Equipment Recommendations Other (comment) (tbd by next venue of care)  Recommendations for Other Services       Functional Status Assessment Patient has had a recent decline in their functional status and demonstrates the ability to make significant improvements in function in a reasonable and predictable amount of time.     Precautions / Restrictions Precautions Precautions: Fall Restrictions Weight Bearing Restrictions: No      Mobility  Bed Mobility Overal bed mobility: Needs Assistance Bed Mobility: Supine to Sit, Sit to Supine     Supine to sit: HOB elevated, Min assist Sit to supine: Min assist   General bed mobility comments: torso and LE's with sitting up, LE management to  return to supine. Patient Response: Cooperative  Transfers Overall transfer level: Needs assistance Equipment used: Rolling walker (2 wheels) Transfers: Sit to/from Stand Sit to Stand: Min assist, +2 physical assistance, From elevated surface            General transfer comment: max VC's for hand placement. Initial posterior lean requiring minA to correct    Ambulation/Gait               General Gait Details: deferred due to elevated BP  Stairs            Wheelchair Mobility    Modified Rankin (Stroke Patients Only)       Balance Overall balance assessment: Needs assistance Sitting-balance support: No upper extremity supported, Feet supported, Feet unsupported Sitting balance-Leahy Scale: Fair   Postural control: Posterior lean Standing balance support: Bilateral upper extremity supported, Reliant on assistive device for balance Standing balance-Leahy Scale: Poor Standing balance comment: initial posterior lean in standing relying minA to correct with VC's for anterior weight shift.                             Pertinent Vitals/Pain Pain Assessment Pain Assessment: Faces Faces Pain Scale: Hurts little more Pain Location: back pain Pain Descriptors / Indicators: Discomfort Pain Intervention(s): Monitored during session, Repositioned    Home Living Family/patient expects to be discharged to:: Private residence Living Arrangements: Spouse/significant other Available Help at Discharge: Family;Available 24 hours/day Type of Home: House Home Access: Stairs to enter Entrance Stairs-Rails: None Entrance Stairs-Number of Steps: 1   Home Layout: One level Home Equipment: Conservation officer, nature (2 wheels);Cane - single point;Grab bars - tub/shower Additional Comments: wife with dementia, requires intermittent assist with bathing, mainly assist with med management    Prior Function Prior Level of Function : Independent/Modified Independent             Mobility Comments: SPC or RW ADLs Comments: Independent prior to admission     Hand Dominance   Dominant Hand: Right    Extremity/Trunk Assessment   Upper Extremity Assessment Upper Extremity Assessment: Generalized weakness    Lower  Extremity Assessment Lower Extremity Assessment: Generalized weakness    Cervical / Trunk Assessment Cervical / Trunk Assessment: Normal  Communication   Communication: HOH  Cognition Arousal/Alertness: Awake/alert Behavior During Therapy: WFL for tasks assessed/performed Overall Cognitive Status: Difficult to assess                                 General Comments: difficulty following commands but pt is very HoH and does not have hearing aids        General Comments General comments (skin integrity, edema, etc.): BP trending > 200/ 90's at rest and with mobility.    Exercises Other Exercises Other Exercises: Role of PT in acute setting, safe use of DME   Assessment/Plan    PT Assessment Patient needs continued PT services  PT Problem List Decreased strength;Decreased safety awareness;Decreased mobility;Decreased balance       PT Treatment Interventions DME instruction;Therapeutic exercise;Gait training;Balance training;Stair training;Neuromuscular re-education;Functional mobility training;Therapeutic activities;Patient/family education    PT Goals (Current goals can be found in the Care Plan section)  Acute Rehab PT Goals Patient Stated Goal: ambulate to go home PT Goal Formulation: With patient Time For Goal Achievement: 11/14/21 Potential to Achieve Goals: Fair    Frequency 7X/week     Co-evaluation  AM-PAC PT "6 Clicks" Mobility  Outcome Measure Help needed turning from your back to your side while in a flat bed without using bedrails?: A Little Help needed moving from lying on your back to sitting on the side of a flat bed without using bedrails?: A Little Help needed moving to and from a bed to a chair (including a wheelchair)?: A Lot Help needed standing up from a chair using your arms (e.g., wheelchair or bedside chair)?: A Lot Help needed to walk in hospital room?: A Lot Help needed climbing 3-5 steps with a railing? : A  Lot 6 Click Score: 14    End of Session Equipment Utilized During Treatment: Gait belt Activity Tolerance: Treatment limited secondary to medical complications (Comment) (elevated BP) Patient left: in bed Nurse Communication: Mobility status PT Visit Diagnosis: Difficulty in walking, not elsewhere classified (R26.2);Other abnormalities of gait and mobility (R26.89);History of falling (Z91.81);Muscle weakness (generalized) (M62.81)    Time: 6578-4696 PT Time Calculation (min) (ACUTE ONLY): 20 min   Charges:   PT Evaluation $PT Eval Moderate Complexity: Ladue M. Fairly IV, PT, DPT Physical Therapist- [facility]  10/31/2021, 1:10 PM

## 2021-10-31 NOTE — Progress Notes (Signed)
SLP Cancellation Note  Patient Details Name: Justin Griffith MRN: [id number] DOB: 1940/06/30   Cancelled treatment:        Chart reviewed, Pt visited. CT did not show evidence of acute event. No reported speech or language deficits. Speech was clear and appropriate for brief visit prior to MD coming in the room to assess. No ST indicated at this time but please notify ST of any further concerns.   Lucila Maine 10/31/2021, 10:36 AM

## 2021-10-31 NOTE — Assessment & Plan Note (Signed)
2D echo on 09/20/2021 showed EF of 60 to 65%.  Patient does not have leg edema JVD.  CHF is compensated. -hold home torsemide

## 2021-10-31 NOTE — ED Notes (Signed)
US at bedside

## 2021-10-31 NOTE — Evaluation (Signed)
Occupational Therapy Evaluation Patient Details Name: Justin Griffith MRN: [id number] DOB: 11-13-1940 Today's Date: 10/31/2021   History of Present Illness Harjit Leider is an 54yoM who comes to [facility] on 2/24 c substernal CP c raditation to Left shoulder, inittial troponin >27,000. Pt admitted for NSTEMI. PMH: CAD, NSTEMI, HTN, HLD, DM, COPD, CVA, remote tobacco use, colonCA, PVD, tremor. Pt underwent cardiac cath on 2/25, then developed gross hematuria. Afternoon of 2/26 developed AMS, lethargy, hypotension, seens by neurology, concern for potential hospital delerium.   Clinical Impression   Mr Bott was seen for OT evaluation this date. Prior to hospital admission, pt was MOD I for mobility and ALDs using RW, reports hx of falls ~1/week. Pt lives with spouse who has dementia, pt is caregiver and assists with bathing, cues for mobility. Pt presents to acute OT demonstrating impaired ADL performance and functional mobility 2/2 decreased activity tolerance and functional strength/ROM/balance deficits. HoH vs poor command following.   Pt currently requires MIN A bed mobility. MIN A x2 + RW sit<>stand and side steps along bed - posterior lean in standing and cues for RW use. MAX A don B socks seated EOB. Poor standing tolerance and decreased insight into deficits. Pt would benefit from skilled OT to address noted impairments and functional limitations (see below for any additional details). Upon hospital discharge, recommend STR to maximize pt safety and return to PLOF.    Recommendations for follow up therapy are one component of a multi-disciplinary discharge planning process, led by the attending physician.  Recommendations may be updated based on patient status, additional functional criteria and insurance authorization.   Follow Up Recommendations  Skilled nursing-short term rehab (<3 hours/day)    Assistance Recommended at Discharge Frequent or constant Supervision/Assistance  Patient can return  home with the following A lot of help with walking and/or transfers;A lot of help with bathing/dressing/bathroom    Functional Status Assessment  Patient has had a recent decline in their functional status and demonstrates the ability to make significant improvements in function in a reasonable and predictable amount of time.  Equipment Recommendations  BSC/3in1    Recommendations for Other Services       Precautions / Restrictions Precautions Precautions: Fall Restrictions Weight Bearing Restrictions: No      Mobility Bed Mobility Overal bed mobility: Needs Assistance Bed Mobility: Supine to Sit, Sit to Supine     Supine to sit: Min assist, HOB elevated Sit to supine: Min assist        Transfers Overall transfer level: Needs assistance Equipment used: Rolling walker (2 wheels) Transfers: Sit to/from Stand Sit to Stand: Min assist, +2 physical assistance, From elevated surface           General transfer comment: side steps along EOB, cues for safe RW technique      Balance Overall balance assessment: Needs assistance Sitting-balance support: No upper extremity supported, Feet supported, Feet unsupported Sitting balance-Leahy Scale: Fair   Postural control: Posterior lean Standing balance support: Bilateral upper extremity supported, Reliant on assistive device for balance Standing balance-Leahy Scale: Poor                             ADL either performed or assessed with clinical judgement   ADL Overall ADL's : Needs assistance/impaired  General ADL Comments: MAX A don B socks seated EOB. MIN A x2 + RW fro simulated BSC t/f, poor tolerance.      Pertinent Vitals/Pain Pain Assessment Pain Assessment: Faces Faces Pain Scale: Hurts little more Pain Location: back pain Pain Descriptors / Indicators: Discomfort Pain Intervention(s): Limited activity within patient's tolerance, Repositioned      Hand Dominance Right   Extremity/Trunk Assessment Upper Extremity Assessment Upper Extremity Assessment: Generalized weakness   Lower Extremity Assessment Lower Extremity Assessment: Generalized weakness   Cervical / Trunk Assessment Cervical / Trunk Assessment: Normal   Communication Communication Communication: HOH   Cognition Arousal/Alertness: Awake/alert Behavior During Therapy: WFL for tasks assessed/performed Overall Cognitive Status: Difficult to assess Area of Impairment: Memory, Following commands, Safety/judgement, Problem solving                     Memory: Decreased recall of precautions Following Commands: Follows one step commands with increased time Safety/Judgement: Decreased awareness of safety, Decreased awareness of deficits   Problem Solving: Slow processing, Decreased initiation, Difficulty sequencing, Requires verbal cues, Requires tactile cues General Comments: difficulty following commands but pt is very HoH and does not have hearing aids     General Comments  BP 204/90     Home Living Family/patient expects to be discharged to:: Private residence Living Arrangements: Spouse/significant other Available Help at Discharge: Family;Available 24 hours/day Type of Home: House Home Access: Stairs to enter [employer] of Steps: 1 Entrance Stairs-Rails: None Home Layout: One level     Bathroom Shower/Tub: Walk-in shower         Home Equipment: Conservation officer, nature (2 wheels);Cane - single point;Grab bars - tub/shower   Additional Comments: wife with dementia, requires cues for mobility and assist for bathing      Prior Functioning/Environment Prior Level of Function : Independent/Modified Independent             Mobility Comments: reprts 1 fall per week 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        OT Problem List: Decreased strength;Decreased range of motion;Decreased activity tolerance;Impaired balance (sitting and/or  standing);Decreased safety awareness      OT Treatment/Interventions: Self-care/ADL training;Therapeutic exercise;Energy conservation;DME and/or AE instruction;Therapeutic activities;Patient/family education;Balance training    OT Goals(Current goals can be found in the care plan section) Acute Rehab OT Goals Patient Stated Goal: to go home OT Goal Formulation: With patient Time For Goal Achievement: 11/14/21 Potential to Achieve Goals: Good ADL Goals Pt Will Perform Grooming: with modified independence;standing Pt Will Perform Lower Body Dressing: with modified independence;sit to/from stand Pt Will Transfer to Toilet: with modified independence;ambulating;regular height toilet  OT Frequency: Min 2X/week    Co-evaluation              AM-PAC OT "6 Clicks" Daily Activity     Outcome Measure Help from another person eating meals?: None Help from another person taking care of personal grooming?: A Lot Help from another person toileting, which includes using toliet, bedpan, or urinal?: A Lot Help from another person bathing (including washing, rinsing, drying)?: A Lot Help from another person to put on and taking off regular upper body clothing?: A Little Help from another person to put on and taking off regular lower body clothing?: A Lot 6 Click Score: 15   End of Session    Activity Tolerance: Patient tolerated treatment well Patient left: in bed;with call bell/phone within reach  OT Visit Diagnosis: Other abnormalities of gait and mobility (R26.89);Repeated falls (R29.6)  Time: 9499-7182 OT Time Calculation (min): 21 min Charges:  OT General Charges $OT Visit: 1 Visit OT Evaluation $OT Eval Moderate Complexity: 1 Mod  Dessie Coma, M.S. OTR/L  10/31/21, 2:58 PM  ascom [phone]

## 2021-10-31 NOTE — Assessment & Plan Note (Signed)
Renal function close to baseline.  Recent baseline creatinine 1.5-1.85.  His creatinine is 1.65, BUN 21. -Monitor renal function by BMP

## 2021-11-01 ENCOUNTER — Inpatient Hospital Stay: Payer: HMO

## 2021-11-01 DIAGNOSIS — N401 Enlarged prostate with lower urinary tract symptoms: Secondary | ICD-10-CM | POA: Diagnosis not present

## 2021-11-01 LAB — URINE CULTURE: Culture: NO GROWTH

## 2021-11-01 LAB — LIPID PANEL
Cholesterol: 126 mg/dL (ref 0–200)
HDL: 27 mg/dL — ABNORMAL LOW (ref >40)
LDL Cholesterol: 62 mg/dL (ref 0–99)
Total CHOL/HDL Ratio: 4.7 RATIO
Triglycerides: 185 mg/dL — ABNORMAL HIGH (ref <150)
VLDL: 37 mg/dL (ref 0–40)

## 2021-11-01 LAB — GLUCOSE, CAPILLARY
Glucose-Capillary: 224 mg/dL — ABNORMAL HIGH (ref 70–99)
Glucose-Capillary: 199 mg/dL — ABNORMAL HIGH (ref 70–99)
Glucose-Capillary: 205 mg/dL — ABNORMAL HIGH (ref 70–99)
Glucose-Capillary: 187 mg/dL — ABNORMAL HIGH (ref 70–99)

## 2021-11-01 LAB — BASIC METABOLIC PANEL
Anion gap: 8 (ref 5–15)
BUN: 28 mg/dL — ABNORMAL HIGH (ref 8–23)
CO2: 29 mmol/L (ref 22–32)
Calcium: 9.9 mg/dL (ref 8.9–10.3)
Chloride: 99 mmol/L (ref 98–111)
Creatinine, Ser: 1.68 mg/dL — ABNORMAL HIGH (ref 0.61–1.24)
GFR, Estimated: 41 mL/min — ABNORMAL LOW (ref >60)
Glucose, Bld: 234 mg/dL — ABNORMAL HIGH (ref 70–99)
Potassium: 3.5 mmol/L (ref 3.5–5.1)
Sodium: 136 mmol/L (ref 135–145)

## 2021-11-01 MED ORDER — INSULIN GLARGINE-YFGN 100 UNIT/ML ~~LOC~~ SOLN
10.0000 [IU] | Freq: Every day | SUBCUTANEOUS | Status: DC
  Administered 2021-11-01 – 2021-11-03 (×3): 10 [IU] via SUBCUTANEOUS
  Filled 2021-11-01 (×4): qty 0.1

## 2021-11-01 NOTE — NC FL2 (Signed)
[place] LEVEL OF CARE SCREENING TOOL     IDENTIFICATION  Patient Name: Justin Griffith Birthdate: 10-10-1940 Sex: male Admission Date (Current Location): 10/30/2021  [facility] and [place] Number:  Engineering geologist and Address:  [facility], [address], [place], [place] [address]      Provider Number:    Attending Physician Name and Address:  Enzo Bi, MD  Relative Name and Phone Number:  Juluis Rainier [phone]    Current Level of Care: Hospital Recommended Level of Care: [place] Prior Approval Number:    Date Approved/Denied:   PASRR Number: [id number]  Discharge Plan: SNF    Current Diagnoses: Patient Active Problem List   Diagnosis Date Noted   Recurrent falls 10/31/2021   Stroke ([place]) 10/31/2021   HLD (hyperlipidemia) 10/31/2021   Type II diabetes mellitus with renal manifestations ([place]) 10/31/2021   CAD (coronary artery disease) 10/31/2021   Chronic diastolic CHF (congestive heart failure) ([place]) 10/31/2021   Right elbow pain 10/15/2021   Gross hematuria 10/12/2021   Chronic anticoagulation 10/12/2021   Stage 3b chronic kidney disease (CKD) ([place]) 10/12/2021   History of pulmonary embolism 09/18/21 10/06/2021   Essential hypertension 09/19/2021   Generalized weakness 06/25/2021   Lymphedema 03/17/2021   Headache disorder 02/27/2021   Falls frequently 11/28/2020   Aortic atherosclerosis ([place]) 06/12/2020   History of embolic stroke 36/64/4034   At high risk for falls 05/08/2020   History of cold sores 05/06/2020   History of non-ST elevation myocardial infarction (NSTEMI) 03/28/2020   Elevated TSH 02/25/2020   Poor mobility 02/02/2020   Coronary artery disease 01/23/2020   History of CVA (cerebrovascular accident) 01/23/2020   Memory changes 01/01/2020   Chronic venous insufficiency 05/23/2019   B12 deficiency 04/23/2019   Constipation 04/21/2019   Tremor 11/14/2018   Obesity with  body mass index (BMI) of 30.0 to 39.9 09/07/2017   Chronic obstructive pulmonary disease (COPD) ([place]) 04/29/2017   Intertrigo 02/02/2017   BPH with obstruction/lower urinary tract symptoms Foley dependent 07/23/2016   Advanced care planning/counseling discussion 74/25/9563   Eosinophilic esophagitis 87/56/4332   PAD (peripheral artery disease) ([place]) 01/08/2016   Gastroesophageal reflux disease without esophagitis 01/06/2016   Hyperlipidemia associated with type 2 diabetes mellitus ([place]) 01/16/2015   Sleep apnea 01/16/2015   BMI 40.0-44.9, adult ([place]) 01/16/2015   Hypertension associated with diabetes ([place]) 10/11/2014   Type 2 diabetes mellitus with proteinuria ([place]) 10/11/2014   Back pain 10/11/2014   History of colon cancer     Orientation RESPIRATION BLADDER Height & Weight     Self, Time, Situation, Place  Normal Incontinent Weight: 260 lb (117.9 kg) Height:  '5\' 11"'$  (180.3 cm)  BEHAVIORAL SYMPTOMS/MOOD NEUROLOGICAL BOWEL NUTRITION STATUS      Continent Diet  AMBULATORY STATUS COMMUNICATION OF NEEDS Skin   Extensive Assist Verbally Normal                       Personal Care Assistance Level of Assistance  Bathing, Feeding, Dressing Bathing Assistance: Maximum assistance Feeding assistance: Independent Dressing Assistance: Maximum assistance     Functional Limitations Info  Sight, Hearing, Speech Sight Info: Adequate Hearing Info: Impaired Speech Info: Adequate    SPECIAL CARE FACTORS FREQUENCY  PT (By licensed PT), OT (By licensed OT)     PT Frequency: 5x per week OT Frequency: 5x per week            Contractures Contractures Info: Not  present    Additional Factors Info  Code Status, Allergies Code Status Info: full Allergies Info: Wilford Grist, Dulaglutide-Diarrhea, Liraglutide-Diarrhea, Maryla Morrow           Current Medications (11/01/2021):  This is the current hospital active medication list Current Facility-Administered Medications   Medication Dose Route Frequency Provider Last Rate Last Admin   acetaminophen (TYLENOL) tablet 650 mg  650 mg Oral Q6H PRN Mansy, Jan A, MD       Or   acetaminophen (TYLENOL) suppository 650 mg  650 mg Rectal Q6H PRN Mansy, Jan A, MD       apixaban (ELIQUIS) tablet 5 mg  5 mg Oral BID Ivor Costa, MD   5 mg at 11/01/21 0823   arformoterol (BROVANA) nebulizer solution 15 mcg  15 mcg Nebulization BID Ivor Costa, MD   15 mcg at 11/01/21 9629   And   budesonide (PULMICORT) nebulizer solution 0.5 mg  0.5 mg Nebulization BID Ivor Costa, MD   0.5 mg at 11/01/21 0736   And   umeclidinium bromide (INCRUSE ELLIPTA) 62.5 MCG/ACT 1 puff  1 puff Inhalation Daily Ivor Costa, MD   1 puff at 11/01/21 1003   cholecalciferol (VITAMIN D3) 25 MCG (1000 UNIT) tablet 2,000 Units  2,000 Units Oral QHS Ivor Costa, MD   2,000 Units at 10/31/21 2222   cloNIDine (CATAPRES) tablet 0.1 mg  0.1 mg Oral BID Ivor Costa, MD   0.1 mg at 11/01/21 5284   cyanocobalamin (VITAMIN B12) tablet 1,000 mcg  1,000 mcg Oral QHS Ivor Costa, MD   1,000 mcg at 10/31/21 2224   dextromethorphan-guaiFENesin ([place] DM) 30-600 MG per 12 hr tablet 1 tablet  1 tablet Oral BID PRN Ivor Costa, MD       fenofibrate tablet 160 mg  160 mg Oral Daily Mansy, Jan A, MD   160 mg at 11/01/21 1324   gabapentin (NEURONTIN) tablet 600 mg  600 mg Oral BID Ivor Costa, MD   600 mg at 11/01/21 4010   hydrALAZINE (APRESOLINE) injection 5 mg  5 mg Intravenous Q2H PRN Ivor Costa, MD       insulin aspart (novoLOG) injection 0-5 Units  0-5 Units Subcutaneous QHS Ivor Costa, MD       insulin aspart (novoLOG) injection 0-9 Units  0-9 Units Subcutaneous TID WC Ivor Costa, MD   2 Units at 11/01/21 1300   insulin glargine-yfgn (SEMGLEE) injection 7 Units  7 Units Subcutaneous QHS Ivor Costa, MD   7 Units at 10/31/21 2224   ipratropium-albuterol (DUONEB) 0.5-2.5 (3) MG/3ML nebulizer solution 3 mL  3 mL Nebulization Q4H PRN Ivor Costa, MD       lidocaine (LIDODERM) 5 % 1  patch  1 patch Transdermal Q24H Ivor Costa, MD   1 patch at 10/31/21 0934   magnesium hydroxide (MILK OF MAGNESIA) suspension 30 mL  30 mL Oral Daily PRN Mansy, Jan A, MD       magnesium oxide (MAG-OX) tablet 400 mg  400 mg Oral Daily Ivor Costa, MD   400 mg at 11/01/21 0823   mirabegron ER (MYRBETRIQ) tablet 50 mg  50 mg Oral Daily Ivor Costa, MD   50 mg at 11/01/21 0823   nortriptyline (PAMELOR) capsule 10 mg  10 mg Oral BID Ivor Costa, MD   10 mg at 11/01/21 0823   ondansetron (ZOFRAN) tablet 4 mg  4 mg Oral Q6H PRN Mansy, Jan A, MD       Or   ondansetron [facility]) injection 4  mg  4 mg Intravenous Q6H PRN Mansy, Jan A, MD       rosuvastatin (CRESTOR) tablet 40 mg  40 mg Oral QHS Mansy, Jan A, MD   40 mg at 10/31/21 2222   torsemide (DEMADEX) tablet 80 mg  80 mg Oral BID Mansy, Jan A, MD   80 mg at 11/01/21 0823   traZODone (DESYREL) tablet 25 mg  25 mg Oral QHS PRN Mansy, Jan A, MD   25 mg at 10/31/21 2222     Discharge Medications: Please see discharge summary for a list of discharge medications.  Relevant Imaging Results:  Relevant Lab Results:   Additional Information SSN [id number]  Elliot Gurney [place], [place]

## 2021-11-01 NOTE — TOC Initial Note (Addendum)
Transition of Care [facility]) - Initial/Assessment Note    Patient Details  Name: Justin Griffith MRN: [id number] Date of Birth: January 20, 1941  Transition of [facility]) CM/SW Contact:    Elliot Gurney [place], [place] Phone Number: 11/01/2021, 3:11 PM  Clinical Narrative:                 Met with patient at bedside to discuss recommendation for SNF. Patient states that he lives at home with his spouse who has been diagnosed with Alzheimer's.  Patient's spouse is staying with their daughter while patient is in the hospital.   Patient states that he has a walker and cain in the home and is followed by [facility]. He uses [facility] for his pharmacy needs. Patient is agreeable to SNF stay, stating that he does not want to go return to [facility], he will consider other local facilities. Bed search initiated.  TOC to continue to follow  [employer], LCSW Transition of Care [phone]   Expected Discharge Plan: [place] Barriers to Discharge: Continued Medical Work up   Patient Goals and CMS Choice        Expected Discharge Plan and Services Expected Discharge Plan: [place] In-house Referral: Clinical Social Work   Post Acute Care Choice: [place] Living arrangements for the past 2 months: [place]                                      Prior Living Arrangements/Services Living arrangements for the past 2 months: Single Family Home Lives with:: Spouse   Do you feel safe going back to the place where you live?: Yes      Need for Family Participation in Patient Care: No (Comment) Care giver support system in place?: Yes (comment)   Criminal Activity/Legal Involvement Pertinent to Current Situation/Hospitalization: No - Comment as needed  Activities of Daily Living Home Assistive Devices/Equipment: Gilford Rile (specify type) ADL Screening (condition at time of admission) Patient's cognitive  ability adequate to safely complete daily activities?: Yes Is the patient deaf or have difficulty hearing?: Yes Does the patient have difficulty seeing, even when wearing glasses/contacts?: No Does the patient have difficulty concentrating, remembering, or making decisions?: No Patient able to express need for assistance with ADLs?: Yes Does the patient have difficulty dressing or bathing?: Yes Independently performs ADLs?: No Communication: Independent Dressing (OT): Independent Grooming: Independent Feeding: Independent Bathing: Needs assistance Is this a change from baseline?: Change from baseline, expected to last <3 days Toileting: Needs assistance Is this a change from baseline?: Change from baseline, expected to last <3 days In/Out Bed: Needs assistance Is this a change from baseline?: Change from baseline, expected to last <3 days Walks in Home: Needs assistance Is this a change from baseline?: Change from baseline, expected to last <3 days Does the patient have difficulty walking or climbing stairs?: Yes Weakness of Legs: Both Weakness of Arms/Hands: None  Permission Sought/Granted                  Emotional Assessment Appearance:: Appears stated age Attitude/Demeanor/Rapport: Engaged Affect (typically observed): Accepting Orientation: : Oriented to Self, Oriented to Place, Oriented to  Time, Oriented to Situation Alcohol / Substance Use: Not Applicable Psych Involvement: No (comment)  Admission diagnosis:  Weakness [R53.1] Recurrent falls [R29.6] Patient Active Problem List   Diagnosis Date Noted   Recurrent falls  10/31/2021   Stroke ([place]) 10/31/2021   HLD (hyperlipidemia) 10/31/2021   Type II diabetes mellitus with renal manifestations ([place]) 10/31/2021   CAD (coronary artery disease) 10/31/2021   Chronic diastolic CHF (congestive heart failure) ([place]) 10/31/2021   Right elbow pain 10/15/2021   Gross hematuria 10/12/2021   Chronic anticoagulation 10/12/2021    Stage 3b chronic kidney disease (CKD) ([place]) 10/12/2021   History of pulmonary embolism 09/18/21 10/06/2021   Essential hypertension 09/19/2021   Generalized weakness 06/25/2021   Lymphedema 03/17/2021   Headache disorder 02/27/2021   Falls frequently 11/28/2020   Aortic atherosclerosis ([place]) 06/12/2020   History of embolic stroke 29/19/1660   At high risk for falls 05/08/2020   History of cold sores 05/06/2020   History of non-ST elevation myocardial infarction (NSTEMI) 03/28/2020   Elevated TSH 02/25/2020   Poor mobility 02/02/2020   Coronary artery disease 01/23/2020   History of CVA (cerebrovascular accident) 01/23/2020   Memory changes 01/01/2020   Chronic venous insufficiency 05/23/2019   B12 deficiency 04/23/2019   Constipation 04/21/2019   Tremor 11/14/2018   Obesity with body mass index (BMI) of 30.0 to 39.9 09/07/2017   Chronic obstructive pulmonary disease (COPD) ([place]) 04/29/2017   Intertrigo 02/02/2017   BPH with obstruction/lower urinary tract symptoms Foley dependent 07/23/2016   Advanced care planning/counseling discussion 60/06/5995   Eosinophilic esophagitis 74/14/2395   PAD (peripheral artery disease) ([place]) 01/08/2016   Gastroesophageal reflux disease without esophagitis 01/06/2016   Hyperlipidemia associated with type 2 diabetes mellitus ([place]) 01/16/2015   Sleep apnea 01/16/2015   BMI 40.0-44.9, adult ([place]) 01/16/2015   Hypertension associated with diabetes ([place]) 10/11/2014   Type 2 diabetes mellitus with proteinuria ([place]) 10/11/2014   Back pain 10/11/2014   History of colon cancer    PCP:  [name]:   [place], [place] - [address] [place] [address] Phone: [phone] Fax: [name], [place]. [place] [address] Phone: [phone] Fax: [phone]  [id number] [place] [address] [place] [address]Phone: [phone]: [phone]    Social Determinants of Health (SDOH) Interventions    Readmission Risk Interventions    09/21/2021   10:42 AM 05/21/2020   10:01 AM 05/18/2020   12:17 PM  Readmission Risk Prevention Plan  Transportation Screening Complete Complete Complete  PCP or Specialist Appt within 3-5 Days Complete Complete   HRI or Home Care Consult Complete Complete Complete  Social Work Consult for [place] Ros[place]anning/Counseling Complete Complete Complete  Palliative Care Screening Not Applicable Not Applicable Not Applicable  Medication Review (Press photographer Complete Complete Complete

## 2021-11-01 NOTE — Progress Notes (Signed)
Physical Therapy Treatment Patient Details Name: Justin Griffith MRN: [id number] DOB: [birth year] Today's Date: 11/01/2021   History of Present Illness Justin Griffith is an 54yoM who [facility] on [facility]24 c substernal CP c raditation to Left shoulder, inittial troponin >27,000. Pt admitted for NSTEMI. PMH: CAD, NSTEMI, HTN, HLD, DM, COPD, CVA, remote tobacco use, colonCA, PVD, tremor. Pt underwent cardiac cath on 2/25, then developed gross hematuria. Afternoon of 2/26 developed AMS, lethargy, hypotension, seens by neurology, concern for potential hospital delerium.    PT Comments    Seen for planned treatment session. Intermittently opens eyes and responds to therapist, but quickly closes eyes back and returns to sleep.  Assisted with hygiene (spilled lunch/pudding over chest), gown change and repositioning to midline in bed, dep assist from therapist (+2 for scooting up in bed).  Does grimace (back pain?) with repositioning in bed, but settles once movement complete.  Unable to progress further this date due to lethargy (even with max stimulation).  Will continue efforts next date.     Recommendations for follow up therapy are one component of a multi-disciplinary discharge planning process, led by the attending physician.  Recommendations may be updated based on patient status, additional functional criteria and insurance authorization.  Follow Up Recommendations  Skilled nursing-short term rehab (<3 hours/day) Can patient physically be transported by private vehicle: No   Assistance Recommended at Discharge Frequent or constant Supervision/Assistance  Patient can return home with the following Two people to help with walking and/or transfers;Two people to help with bathing/dressing/bathroom   Equipment Recommendations       Recommendations for Other Services       Precautions / Restrictions Precautions Precautions: Fall Restrictions Weight Bearing Restrictions: No     Mobility  Bed  Mobility Overal bed mobility: Needs Assistance Bed Mobility: Rolling (scooting up in bed) Rolling: Total assist, +2 for physical assistance         General bed mobility comments: limited ability to participate/assist this date    Transfers                   General transfer comment: unsafe/unable due to lethargy    Ambulation/Gait               General Gait Details: unsafe/unable due to lethargy   Stairs             Wheelchair Mobility    Modified Rankin (Stroke Patients Only)       Balance                                            Cognition Arousal/Alertness: Lethargic Behavior During Therapy: Flat affect Overall Cognitive Status: Difficult to assess                                 General Comments: difficult to assess due to lethargy and limited alertness/participation with session        Exercises      General Comments        Pertinent Vitals/Pain Pain Assessment Pain Assessment: Faces Faces Pain Scale: Hurts little more Pain Location: back pain Pain Descriptors / Indicators: Discomfort, Grimacing Pain Intervention(s): Limited activity within patient's tolerance, Monitored during session, Repositioned    Home Living  Prior Function            PT Goals (current goals can now be found in the care plan section) Acute Rehab PT Goals Patient Stated Goal: ambulate to go home PT Goal Formulation: With patient Time For Goal Achievement: 11/14/21 Potential to Achieve Goals: Fair Progress towards PT goals: Not progressing toward goals - comment (limited by lethargy)    Frequency    7X/week      PT Plan Current plan remains appropriate    Co-evaluation              AM-PAC PT "6 Clicks" Mobility   Outcome Measure  Help needed turning from your back to your side while in a flat bed without using bedrails?: Total Help needed moving from lying on  your back to sitting on the side of a flat bed without using bedrails?: Total Help needed moving to and from a bed to a chair (including a wheelchair)?: Total Help needed standing up from a chair using your arms (e.g., wheelchair or bedside chair)?: Total Help needed to walk in hospital room?: Total Help needed climbing 3-5 steps with a railing? : Total 6 Click Score: 6    End of Session   Activity Tolerance: Patient limited by lethargy Patient left: in bed;with call bell/phone within reach;with nursing/sitter in room (CNA at bedside for linen change/ADL) Nurse Communication: Mobility status PT Visit Diagnosis: Difficulty in walking, not elsewhere classified (R26.2);Other abnormalities of gait and mobility (R26.89);History of falling (Z91.81);Muscle weakness (generalized) (M62.81)     Time: 0737-1062 PT Time Calculation (min) (ACUTE ONLY): 9 min  Charges:  $Therapeutic Activity: 8-22 mins                     Dereke Neumann H. Owens Shark, PT, DPT, NCS 11/01/21, 2:42 PM [phone]

## 2021-11-01 NOTE — Progress Notes (Signed)
PROGRESS NOTE    Justin Griffith  LOV:[id number] DOB: 08-08-1940 DOA: 10/30/2021 PCP: [name]  107A/107A-AA  LOS: 1 day   Brief hospital course: No notes on file  Assessment & Plan: Justin Griffith is a 81 y.o. male with medical history significant of hypertension, hyperlipidemia, diabetes mellitus, COPD/asthma, stroke, depression, OSA, colon cancer, BPH with urinary retention with chronic indwelling catheter placement, PE on Eliquis, obesity, CAD, CABG, CKD-3B, dCHF, PAD, who presented with multiple falls.  On admission, found to have right-sided weakness in both arm and leg.   * Stroke [facility]) acute subcortical infarct on the left, likely small vessel infarct.  Consulted Dr. Leonel Ramsay of neurology.  Patient had 2D echo on 09/20/2021 which showed EF of 60 to 65%, will not repeat 2D echo. Plan: --cont Eliquis --per neuro, would not add antiplatelet therapy while he is on anticoagulation. --cont statin  Recurrent falls Frequent falls at baseline, exacerbated by stroke -PT/OT -Fall precaution --SNF rehab  Essential hypertension - IV hydralazine as needed for SBP 220, DBP 110 -hold Coreg, hydralazine, Cozaar to allow permissive hypertension --hold torsemide -continue home clonidine 0.1 mg twice daily to avoid rebounding   CAD (coronary artery disease) S/p of CABG.  No chest pain.  Troponin 18, then 16, which likely nonspecific change. -Continue Crestor, fenofibrate  Chronic obstructive pulmonary disease (COPD) (HCC) Stable.  Oxygen saturation 97% on room air. -Bronchodilators  History of pulmonary embolism 09/18/21 - Continue Eliquis  Type II diabetes mellitus with renal manifestations (HCC) Recent A1c 8.9, poorly controlled.  Patient taking metformin and glargine insulin 10 unit daily --increase glargine back to home 10u nightly -Sliding scale insulin  HLD (hyperlipidemia) - Crestor and fenofibrate  Chronic diastolic CHF (congestive heart failure)  ([place]) 2D echo on 09/20/2021 showed EF of 60 to 65%.  Patient does not have leg edema JVD.  CHF is compensated. -hold home torsemide  Stage 3b chronic kidney disease (CKD) ([place]) Renal function close to baseline.  Recent baseline creatinine 1.5-1.85.  His creatinine is 1.65, BUN 21. 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   -Encouraged to lose weight.     DVT prophylaxis: OA:CZYSAYT Code Status: Full code  Family Communication:  Level of care: Telemetry Medical Dispo:   The patient is from: home Anticipated d/c is to: SNF rehab Anticipated d/c date is: whenever bed available   Subjective and Interval History:  Pt did not notice he had right-sided weakness.   Objective: Vitals:   11/01/21 0518 11/01/21 0735 11/01/21 1205 11/01/21 1606  BP: (!) 133/55 (!) 130/56 113/67 (!) 124/56  Pulse: (!) 54 (!) 54 (!) 53 (!) 52  Resp: '16 15 15 15  '$ Temp: 97.9 F (36.6 C) 97.9 F (36.6 C) 98.5 F (36.9 C) 98.7 F (37.1 C)  TempSrc:      SpO2: 100% 94% 96% 97%  Weight:      Height:        Intake/Output Summary (Last 24 hours) at 11/01/2021 1635 Last data filed at 11/01/2021 0500 Gross per 24 hour  Intake 240 ml  Output 1350 ml  Net -1110 ml   Filed Weights   10/31/21 0911  Weight: 117.9 kg    Examination:   Constitutional: NAD, AAOx3  HEENT: conjunctivae and lids normal, EOMI CV: No cyanosis.   RESP: normal respiratory effort, on RA SKIN: warm, dry Neuro: II - XII grossly intact.  Grip strength weaker on the right. Psych: Normal mood and affect.  Appropriate judgement and reason   Data Reviewed: I have personally reviewed labs and imaging studies  Time spent: 50 minutes  Enzo Bi,  MD [facility] If 7PM-7AM, please contact night-coverage 11/01/2021, 4:35 PM

## 2021-11-01 NOTE — Plan of Care (Signed)
  Problem: Education: Goal: Ability to describe self-care measures that may prevent or decrease complications (Diabetes Survival Skills Education) will improve Outcome: Progressing Goal: Individualized Educational Video(s) Outcome: Progressing   Problem: Coping: Goal: Ability to adjust to condition or change in health will improve Outcome: Progressing   

## 2021-11-01 NOTE — Progress Notes (Signed)
MRA with no significant stenosis, LDL is okay but with some hypertriglyceridemia.  We will continue Eliquis as per previous note, if he discontinues Eliquis in the future he will need to start on antiplatelet therapy.  Treatment at this point is supportive with PT/OT/ST.  Neurology will be available as needed.  Justin Rack, MD [facility] [phone]  If 7pm- 7am, please page neurology on call as listed in [place].

## 2021-11-02 DIAGNOSIS — N401 Enlarged prostate with lower urinary tract symptoms: Secondary | ICD-10-CM | POA: Diagnosis not present

## 2021-11-02 LAB — CBC
HCT: 37.8 % — ABNORMAL LOW (ref 39.0–52.0)
Hemoglobin: 12.3 g/dL — ABNORMAL LOW (ref 13.0–17.0)
MCH: 29.4 pg (ref 26.0–34.0)
MCHC: 32.5 g/dL (ref 30.0–36.0)
MCV: 90.2 fL (ref 80.0–100.0)
Platelets: 244 10*3/uL (ref 150–400)
RBC: 4.19 MIL/uL — ABNORMAL LOW (ref 4.22–5.81)
RDW: 13.2 % (ref 11.5–15.5)
WBC: 6.7 10*3/uL (ref 4.0–10.5)
nRBC: 0 % (ref 0.0–0.2)

## 2021-11-02 LAB — BASIC METABOLIC PANEL
Anion gap: 5 (ref 5–15)
BUN: 33 mg/dL — ABNORMAL HIGH (ref 8–23)
CO2: 33 mmol/L — ABNORMAL HIGH (ref 22–32)
Calcium: 9.8 mg/dL (ref 8.9–10.3)
Chloride: 100 mmol/L (ref 98–111)
Creatinine, Ser: 1.86 mg/dL — ABNORMAL HIGH (ref 0.61–1.24)
GFR, Estimated: 36 mL/min — ABNORMAL LOW (ref >60)
Glucose, Bld: 187 mg/dL — ABNORMAL HIGH (ref 70–99)
Potassium: 3.7 mmol/L (ref 3.5–5.1)
Sodium: 138 mmol/L (ref 135–145)

## 2021-11-02 LAB — GLUCOSE, CAPILLARY
Glucose-Capillary: 210 mg/dL — ABNORMAL HIGH (ref 70–99)
Glucose-Capillary: 239 mg/dL — ABNORMAL HIGH (ref 70–99)
Glucose-Capillary: 215 mg/dL — ABNORMAL HIGH (ref 70–99)
Glucose-Capillary: 189 mg/dL — ABNORMAL HIGH (ref 70–99)

## 2021-11-02 LAB — MAGNESIUM: Magnesium: 2 mg/dL (ref 1.7–2.4)

## 2021-11-02 MED ORDER — CARVEDILOL 25 MG PO TABS
25.0000 mg | ORAL_TABLET | Freq: Two times a day (BID) | ORAL | Status: DC
  Administered 2021-11-02 – 2021-11-03 (×2): 25 mg via ORAL
  Filled 2021-11-02 (×2): qty 1

## 2021-11-02 MED ORDER — METFORMIN HCL 500 MG PO TABS
1000.0000 mg | ORAL_TABLET | Freq: Two times a day (BID) | ORAL | Status: DC
  Administered 2021-11-02 – 2021-11-04 (×4): 1000 mg via ORAL
  Filled 2021-11-02 (×4): qty 2

## 2021-11-02 MED ORDER — CHLORHEXIDINE GLUCONATE CLOTH 2 % EX PADS
6.0000 | MEDICATED_PAD | Freq: Every day | CUTANEOUS | Status: DC
  Administered 2021-11-02 – 2021-11-04 (×3): 6 via TOPICAL

## 2021-11-02 MED ORDER — LOSARTAN POTASSIUM 50 MG PO TABS
50.0000 mg | ORAL_TABLET | Freq: Every day | ORAL | Status: DC
  Administered 2021-11-02 – 2021-11-04 (×3): 50 mg via ORAL
  Filled 2021-11-02 (×3): qty 1

## 2021-11-02 NOTE — TOC Progression Note (Addendum)
Transition of Care [facility]) - Progression Note    Patient Details  Name: Justin Griffith MRN: [id number] Date of Birth: 1941-03-12  Transition of Care [facility]) CM/SW Contact  Jaely Silman, [place], [place] Phone Number: 11/02/2021, 4:28 PM  Clinical Narrative:     Patient accepted bed offer at [facility]  contacted to start authorization, spoke with [place]   Transition of Care to continue to follow  [facility] [place] Transition of Care [phone]   Expected Discharge Plan: [place] Barriers to Discharge: Continued Medical Work up  Expected Discharge Plan and Services Expected Discharge Plan: [place] In-house Referral: Clinical Social Work   Post Acute Care Choice: [place] Living arrangements for the past 2 months: Single Family Home                                       Social Determinants of Health (SDOH) Interventions    Readmission Risk Interventions    09/21/2021   10:42 AM 05/21/2020   10:01 AM 05/18/2020   12:17 PM  Readmission Risk Prevention Plan  Transportation Screening Complete Complete Complete  PCP or Specialist Appt within 3-5 Days Complete Complete   HRI or Home Care Consult Complete Complete Complete  Social Work Consult for [place] Planning/Counseling Complete Complete Complete  Palliative Care Screening Not Applicable Not Applicable Not Applicable  Medication Review Press photographer) Complete Complete Complete

## 2021-11-02 NOTE — Progress Notes (Signed)
Progress Note   Patient: Justin Griffith [id number] DOB: [birth year] DOA: 10/30/2021     2 DOS: the patient was seen and examined on 11/02/2021   Brief hospital course: Justin Griffith is a 81 y.o. male with medical history significant of hypertension, hyperlipidemia, diabetes mellitus, COPD/asthma, stroke, depression, OSA, colon cancer, BPH with urinary retention with chronic indwelling catheter placement, PE on Eliquis, obesity, CAD, CABG, CKD-3B, dCHF, PAD, who presented with multiple falls.  On admission, found to have right-sided weakness in both arm and leg.    Assessment and Plan: * Stroke [facility]) --acute subcortical infarct on the left, likely small vessel infarct.  -- Consulted Dr. Leonel Ramsay of neurology.  --Patient had 2D echo on 09/20/2021 which showed EF of 60 to 65%, will not repeat 2D echo. --cont Eliquis --per neuro, would not add antiplatelet therapy while he is on anticoagulation. --cont statin  Recurrent falls Frequent falls at baseline, exacerbated by stroke -PT/OT -Fall precaution --SNF rehab  Essential hypertension - IV hydralazine as needed for SBP 220, DBP 110 - Coreg, hydralazine, Cozaar was held to allow permissive hypertension --will resume cozaar and  coreg --hold torsemide -continue home clonidine 0.1 mg twice daily to avoid rebounding   CAD (coronary artery disease) S/p of CABG.  No chest pain.  Troponin 18, then 16, which likely nonspecific change. -Continue Crestor, fenofibrate  Chronic obstructive pulmonary disease (COPD) (HCC) Stable.  Oxygen saturation 97% on room air. -Bronchodilators  History of pulmonary embolism 09/18/21 - Continue Eliquis  Type II diabetes mellitus with renal manifestations (HCC) Recent A1c 8.9, poorly controlled.  Patient taking metformin and glargine insulin 10 unit daily --increase glargine back to home 10u nightly -Sliding scale insulin  HLD (hyperlipidemia) - Crestor and fenofibrate  Chronic diastolic CHF  (congestive heart failure) ([place]) 2D echo on 09/20/2021 showed EF of 60 to 65%.  Patient does not have leg edema JVD.  CHF is compensated. -hold home torsemide  Stage 3b chronic kidney disease (CKD) ([place]) Renal function close to baseline.  Recent baseline creatinine 1.5-1.85.  His creatinine is 1.65, BUN 21. 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   -Encouraged to lose weight.   Overall improving slowly. Awaiting rehab bed.      Subjective: hard on hearing. No family at bedside. Patient tells me he is not going to stay long at rehab since he wants to return back to his wife at home.  Physical Exam: Vitals:   11/02/21 0724 11/02/21 0747 11/02/21 0803 11/02/21 1128  BP:  (!) 143/75  132/78  Pulse:  61  61  Resp:  15  19  Temp:  97.9 F (36.6 C)  98 F (36.7 C)  TempSrc:  Oral    SpO2: 91% 94% 94% 98%  Weight:      Height:       Constitutional: NAD, AAOx3 HEENT: conjunctivae and lids normal, EOMI, HOH+ CV: No cyanosis.   RESP: normal respiratory effort, on RA SKIN: warm, dry Neuro: II - XII grossly intact.  Grip strength weaker on the right. Psych: Normal mood and affect.  Appropriate judgement and reason    Family Communication: none today  Disposition: Status is: Inpatient Remains inpatient appropriate because: awaiting rehab bed availibility  Planned Discharge Destination: Skilled nursing facility    Time spent: 35 minutes  Author: Fritzi Mandes, MD 11/02/2021 2:16 PM  For on call review [email or web address].

## 2021-11-02 NOTE — Progress Notes (Signed)
Physical Therapy Treatment Patient Details Name: Justin Griffith MRN: [id number] DOB: Jul 05, 1940 Today's Date: 11/02/2021   History of Present Illness Justin Griffith is an 53yoM who comes to [facility] on 2/24 c substernal CP c raditation to Left shoulder, inittial troponin >27,000. Pt admitted for NSTEMI. PMH: CAD, NSTEMI, HTN, HLD, DM, COPD, CVA, remote tobacco use, colonCA, PVD, tremor. Pt underwent cardiac cath on 2/25, then developed gross hematuria. Afternoon of 2/26 developed AMS, lethargy, hypotension, seens by neurology, concern for potential hospital delerium.    PT Comments    Pt seen for PT tx with pt agreeable to tx. Pt is HOH but agreeable to participating in treatment. Pt is able to complete supine>sit with max assist with reliance on hospital bed features. Pt demonstrates impaired sitting & standing balance, requiring MAX cuing for safety. Pt is able to complete STS with mod assist & stand pivot to recliner with RW but with very poor safety awareness & safe use of RW. +2 provided for safety for gait attempts with pt able to ambulate ~7 ft with RW & significantly impaired safety awareness & gait pattern (see below). Pt demonstrates R lateral lean in sitting with PT providing pillows to promote midline sitting. Continue to recommend STR upon d/c.    Recommendations for follow up therapy are one component of a multi-disciplinary discharge planning process, led by the attending physician.  Recommendations may be updated based on patient status, additional functional criteria and insurance authorization.  Follow Up Recommendations  Skilled nursing-short term rehab (<3 hours/day) Can patient physically be transported by private vehicle: No   Assistance Recommended at Discharge Frequent or constant Supervision/Assistance  Patient can return home with the following Two people to help with walking and/or transfers;Two people to help with bathing/dressing/bathroom   Equipment Recommendations    (TBD in next venue)    Recommendations for Other Services       Precautions / Restrictions Precautions Precautions: Fall Restrictions Weight Bearing Restrictions: No     Mobility  Bed Mobility Overal bed mobility: Needs Assistance       Supine to sit: Max assist, HOB elevated (cuing for sequencing (moving RLE off of EOB, using bed rails to upright trunk but minimal ability to lift trunk))          Transfers Overall transfer level: Needs assistance Equipment used: Rolling walker (2 wheels) Transfers: Sit to/from Stand, Bed to chair/wheelchair/BSC Sit to Stand: Mod assist (cuing for safe hand placement for STS with RW)   Step pivot transfers: Mod assist       General transfer comment: pt with poor safety awareness, LUE falling off of walker, pushing RW too far in front of her    Ambulation/Gait Ambulation/Gait assistance: Mod assist, +2 safety/equipment Gait Distance (Feet): 7 Feet Assistive device: Rolling walker (2 wheels) Gait Pattern/deviations: Decreased step length - right, Decreased step length - left, Decreased stride length       General Gait Details: R genu recurvatum in stance, pushing RW out in front   Stairs             Wheelchair Mobility    Modified Rankin (Stroke Patients Only)       Balance Overall balance assessment: Needs assistance Sitting-balance support: No upper extremity supported, Feet supported Sitting balance-Leahy Scale: Poor Sitting balance - Comments: pt with slight LOB in all directions, requires cuing for upright posture   Standing balance support: Bilateral upper extremity supported, Reliant on assistive device for balance, During functional activity Standing balance-Leahy  Scale: Poor                              Cognition Arousal/Alertness: Awake/alert Behavior During Therapy: Flat affect                           Following Commands: Follows one step commands with increased  time Safety/Judgement: Decreased awareness of safety, Decreased awareness of deficits   Problem Solving: Slow processing, Decreased initiation, Difficulty sequencing, Requires verbal cues, Requires tactile cues          Exercises      General Comments        Pertinent Vitals/Pain Pain Assessment Pain Assessment: No/denies pain    Home Living                          Prior Function            PT Goals (current goals can now be found in the care plan section) Acute Rehab PT Goals Patient Stated Goal: ambulate to go home PT Goal Formulation: With patient Time For Goal Achievement: 11/14/21 Potential to Achieve Goals: Fair Progress towards PT goals: Progressing toward goals    Frequency    7X/week      PT Plan Current plan remains appropriate    Co-evaluation              AM-PAC PT "6 Clicks" Mobility   Outcome Measure  Help needed turning from your back to your side while in a flat bed without using bedrails?: Total Help needed moving from lying on your back to sitting on the side of a flat bed without using bedrails?: Total Help needed moving to and from a bed to a chair (including a wheelchair)?: Total Help needed standing up from a chair using your arms (e.g., wheelchair or bedside chair)?: Total Help needed to walk in hospital room?: Total Help needed climbing 3-5 steps with a railing? : Total 6 Click Score: 6    End of Session Equipment Utilized During Treatment: Gait belt Activity Tolerance: Patient tolerated treatment well Patient left: in chair;with chair alarm set;with call bell/phone within reach (pillows positioned to promote midline sitting balance vs R lateral lean)   PT Visit Diagnosis: Difficulty in walking, not elsewhere classified (R26.2);Other abnormalities of gait and mobility (R26.89);History of falling (Z91.81);Muscle weakness (generalized) (M62.81);Unsteadiness on feet (R26.81)     Time: 9735-3299 PT Time  Calculation (min) (ACUTE ONLY): 13 min  Charges:  $Therapeutic Activity: 8-22 mins                     Lavone Nian, PT, DPT 11/02/21, 1:18 PM    Waunita Schooner 11/02/2021, 1:16 PM

## 2021-11-03 DIAGNOSIS — N401 Enlarged prostate with lower urinary tract symptoms: Secondary | ICD-10-CM | POA: Diagnosis not present

## 2021-11-03 LAB — GLUCOSE, CAPILLARY
Glucose-Capillary: 185 mg/dL — ABNORMAL HIGH (ref 70–99)
Glucose-Capillary: 192 mg/dL — ABNORMAL HIGH (ref 70–99)
Glucose-Capillary: 133 mg/dL — ABNORMAL HIGH (ref 70–99)
Glucose-Capillary: 134 mg/dL — ABNORMAL HIGH (ref 70–99)
Glucose-Capillary: 156 mg/dL — ABNORMAL HIGH (ref 70–99)

## 2021-11-03 MED ORDER — CARVEDILOL 6.25 MG PO TABS: 6.2500 mg | ORAL_TABLET | Freq: Two times a day (BID) | ORAL | Status: DC

## 2021-11-03 NOTE — Plan of Care (Signed)
Problem: Education: Goal: Ability to describe self-care measures that may prevent or decrease complications (Diabetes Survival Skills Education) will improve 11/03/2021 2248 by Rosalita Levan, RN Outcome: Progressing 11/03/2021 2248 by Rosalita Levan, RN Outcome: Progressing 11/03/2021 2248 by Rosalita Levan, RN Outcome: Progressing Goal: Individualized Educational Video(s) 11/03/2021 2248 by Rosalita Levan, RN Outcome: Progressing 11/03/2021 2248 by Rosalita Levan, RN Outcome: Progressing   Problem: Coping: Goal: Ability to adjust to condition or change in health will improve 11/03/2021 2248 by Rosalita Levan, RN Outcome: Progressing 11/03/2021 2248 by Rosalita Levan, RN Outcome: Progressing 11/03/2021 2248 by Rosalita Levan, RN Outcome: Progressing   Problem: Fluid Volume: Goal: Ability to maintain a balanced intake and output will improve 11/03/2021 2248 by Rosalita Levan, RN Outcome: Progressing 11/03/2021 2248 by Rosalita Levan, RN Outcome: Progressing 11/03/2021 2248 by Rosalita Levan, RN Outcome: Progressing   Problem: Health Behavior/Discharge Planning: Goal: Ability to identify and utilize available resources and services will improve 11/03/2021 2248 by Rosalita Levan, RN Outcome: Progressing 11/03/2021 2248 by Rosalita Levan, RN Outcome: Progressing Goal: Ability to manage health-related needs will improve 11/03/2021 2248 by Rosalita Levan, RN Outcome: Progressing 11/03/2021 2248 by Rosalita Levan, RN Outcome: Progressing   Problem: Metabolic: Goal: Ability to maintain appropriate glucose levels will improve 11/03/2021 2248 by Rosalita Levan, RN Outcome: Progressing 11/03/2021 2248 by Rosalita Levan, RN Outcome: Progressing 11/03/2021 2248 by Rosalita Levan, RN Outcome:  Progressing   Problem: Nutritional: Goal: Maintenance of adequate nutrition will improve 11/03/2021 2248 by Rosalita Levan, RN Outcome: Progressing 11/03/2021 2248 by Rosalita Levan, RN Outcome: Progressing Goal: Progress toward achieving an optimal weight will improve 11/03/2021 2248 by Rosalita Levan, RN Outcome: Progressing 11/03/2021 2248 by Rosalita Levan, RN Outcome: Progressing   Problem: Skin Integrity: Goal: Risk for impaired skin integrity will decrease 11/03/2021 2248 by Rosalita Levan, RN Outcome: Progressing 11/03/2021 2248 by Rosalita Levan, RN Outcome: Progressing   Problem: Tissue Perfusion: Goal: Adequacy of tissue perfusion will improve 11/03/2021 2248 by Rosalita Levan, RN Outcome: Progressing 11/03/2021 2248 by Rosalita Levan, RN Outcome: Progressing   Problem: Education: Goal: Knowledge of General Education information will improve Description: Including pain rating scale, medication(s)/side effects and non-pharmacologic comfort measures 11/03/2021 2248 by Rosalita Levan, RN Outcome: Progressing 11/03/2021 2248 by Rosalita Levan, RN Outcome: Progressing   Problem: Health Behavior/Discharge Planning: Goal: Ability to manage health-related needs will improve 11/03/2021 2248 by Rosalita Levan, RN Outcome: Progressing 11/03/2021 2248 by Rosalita Levan, RN Outcome: Progressing   Problem: Clinical Measurements: Goal: Ability to maintain clinical measurements within normal limits will improve 11/03/2021 2248 by Rosalita Levan, RN Outcome: Progressing 11/03/2021 2248 by Rosalita Levan, RN Outcome: Progressing Goal: Will remain free from infection 11/03/2021 2248 by Rosalita Levan, RN Outcome: Progressing 11/03/2021 2248 by Rosalita Levan, RN Outcome: Progressing Goal: Diagnostic test results  will improve 11/03/2021 2248 by Rosalita Levan, RN Outcome: Progressing 11/03/2021 2248 by Rosalita Levan, RN Outcome: Progressing Goal: Respiratory complications will improve 11/03/2021 2248 by Rosalita Levan, RN Outcome: Progressing 11/03/2021 2248 by Rosalita Levan, RN Outcome: Progressing Goal: Cardiovascular complication will be avoided 11/03/2021 2248  by Rosalita Levan, RN Outcome: Progressing 11/03/2021 2248 by Rosalita Levan, RN Outcome: Progressing   Problem: Activity: Goal: Risk for activity intolerance will decrease 11/03/2021 2248 by Rosalita Levan, RN Outcome: Progressing 11/03/2021 2248 by Rosalita Levan, RN Outcome: Progressing   Problem: Nutrition: Goal: Adequate nutrition will be maintained 11/03/2021 2248 by Rosalita Levan, RN Outcome: Progressing 11/03/2021 2248 by Rosalita Levan, RN Outcome: Progressing 11/03/2021 2248 by Rosalita Levan, RN Outcome: Progressing   Problem: Coping: Goal: Level of anxiety will decrease 11/03/2021 2248 by Rosalita Levan, RN Outcome: Progressing 11/03/2021 2248 by Rosalita Levan, RN Outcome: Progressing   Problem: Elimination: Goal: Will not experience complications related to bowel motility 11/03/2021 2248 by Rosalita Levan, RN Outcome: Progressing 11/03/2021 2248 by Rosalita Levan, RN Outcome: Progressing Goal: Will not experience complications related to urinary retention 11/03/2021 2248 by Rosalita Levan, RN Outcome: Progressing 11/03/2021 2248 by Rosalita Levan, RN Outcome: Progressing   Problem: Pain Managment: Goal: General experience of comfort will improve 11/03/2021 2248 by Rosalita Levan, RN Outcome: Progressing 11/03/2021 2248 by Rosalita Levan, RN Outcome: Progressing 11/03/2021 2248 by Rosalita Levan,  RN Outcome: Progressing   Problem: Safety: Goal: Ability to remain free from injury will improve 11/03/2021 2248 by Rosalita Levan, RN Outcome: Progressing 11/03/2021 2248 by Rosalita Levan, RN Outcome: Progressing 11/03/2021 2248 by Rosalita Levan, RN Outcome: Progressing   Problem: Skin Integrity: Goal: Risk for impaired skin integrity will decrease 11/03/2021 2248 by Rosalita Levan, RN Outcome: Progressing 11/03/2021 2248 by Rosalita Levan, RN Outcome: Progressing 11/03/2021 2248 by Rosalita Levan, RN Outcome: Progressing   Problem: Education: Goal: Knowledge of disease or condition will improve 11/03/2021 2248 by Rosalita Levan, RN Outcome: Progressing 11/03/2021 2248 by Rosalita Levan, RN Outcome: Progressing 11/03/2021 2248 by Rosalita Levan, RN Outcome: Progressing Goal: Knowledge of patient specific risk factors will improve (INDIVIDUALIZE FOR PATIENT) 11/03/2021 2248 by Rosalita Levan, RN Outcome: Progressing 11/03/2021 2248 by Rosalita Levan, RN Outcome: Progressing 11/03/2021 2248 by Rosalita Levan, RN Outcome: Progressing   Problem: Coping: Goal: Will verbalize positive feelings about self 11/03/2021 2248 by Rosalita Levan, RN Outcome: Progressing 11/03/2021 2248 by Rosalita Levan, RN Outcome: Progressing 11/03/2021 2248 by Rosalita Levan, RN Outcome: Progressing Goal: Will identify appropriate support needs 11/03/2021 2248 by Rosalita Levan, RN Outcome: Progressing 11/03/2021 2248 by Rosalita Levan, RN Outcome: Progressing 11/03/2021 2248 by Rosalita Levan, RN Outcome: Progressing   Problem: Health Behavior/Discharge Planning: Goal: Ability to manage health-related needs will improve 11/03/2021 2248 by Rosalita Levan, RN Outcome: Progressing 11/03/2021 2248 by  Rosalita Levan, RN Outcome: Progressing 11/03/2021 2248 by Rosalita Levan, RN Outcome: Progressing   Problem: Self-Care: Goal: Ability to participate in self-care as condition permits will improve 11/03/2021 2248 by Rosalita Levan, RN Outcome: Progressing 11/03/2021 2248 by Rosalita Levan, RN Outcome: Progressing 11/03/2021 2248 by Rosalita Levan, RN Outcome: Progressing   Problem: Nutrition: Goal: Risk of aspiration will decrease 11/03/2021 2248 by Rosalita Levan, RN Outcome: Progressing 11/03/2021 2248 by Rosalita Levan, RN Outcome: Progressing 11/03/2021 2248 by Rosalita Levan, RN Outcome:  Progressing Goal: Dietary intake will improve 11/03/2021 2248 by Rosalita Levan, RN Outcome: Progressing 11/03/2021 2248 by Rosalita Levan, RN Outcome: Progressing 11/03/2021 2248 by Rosalita Levan, RN Outcome: Progressing

## 2021-11-03 NOTE — Care Management Important Message (Signed)
Important Message  Patient Details  Name: Justin Griffith MRN: [id number] Date of Birth: 1940-07-14   Medicare Important Message Given:  Yes     [name] 11/03/2021, 12:18 PM

## 2021-11-03 NOTE — Progress Notes (Signed)
Physical Therapy Treatment Patient Details Name: Justin Griffith MRN: [id number] DOB: 02/03/1941 Today's Date: 11/03/2021   History of Present Illness Justin Griffith is an 40yoM who comes to [facility] on 2/24 c substernal CP c raditation to Left shoulder, inittial troponin >27,000. Pt admitted for NSTEMI. PMH: CAD, NSTEMI, HTN, HLD, DM, COPD, CVA, remote tobacco use, colonCA, PVD, tremor. Pt underwent cardiac cath on 2/25, then developed gross hematuria. Afternoon of 2/26 developed AMS, lethargy, hypotension, seens by neurology, concern for potential hospital delerium.    PT Comments    Pt very lethargic this am, however asking to get up to chair. Pt received in bed unable to finish conversation to order lunch. Pt assisted to EOB with MaxA, HOB raised, and use of side rail. Pt sat EOB ~3 minutes, unable to maintain sitting balance requiring ModA to prevent posterior and Right lateral lean.  Pt unable to tolerate transfer out of bed this date due to lethargy, nursing stated could be due to Trazadone last night. Pt assisted back to supine for rolling L<>R with Mod/MaxA for hygiene.  Continue to recommend SNF placement once medically cleared for d/c.   Recommendations for follow up therapy are one component of a multi-disciplinary discharge planning process, led by the attending physician.  Recommendations may be updated based on patient status, additional functional criteria and insurance authorization.  Follow Up Recommendations  Skilled nursing-short term rehab (<3 hours/day) Can patient physically be transported by private vehicle: No   Assistance Recommended at Discharge Frequent or constant Supervision/Assistance  Patient can return home with the following Two people to help with walking and/or transfers;Two people to help with bathing/dressing/bathroom   Equipment Recommendations   (TBD at next venue)    Recommendations for Other Services       Precautions / Restrictions  Precautions Precautions: Fall Restrictions Weight Bearing Restrictions: No     Mobility  Bed Mobility Overal bed mobility: Needs Assistance Bed Mobility: Rolling Rolling: Max assist   Supine to sit: Max assist, HOB elevated Sit to supine: Mod assist, +2 for physical assistance   General bed mobility comments: Limited due to significant fatigue    Transfers                        Ambulation/Gait                   Stairs             Wheelchair Mobility    Modified Rankin (Stroke Patients Only)       Balance Overall balance assessment: Needs assistance Sitting-balance support: Bilateral upper extremity supported, Feet supported Sitting balance-Leahy Scale: Poor Sitting balance - Comments: Unable to maintain supported sitting balance edge of bed Postural control: Posterior lean, Right lateral lean, Other (comment) (ModA to correct)     Standing balance comment: Unable to tolerate standing                            Cognition Arousal/Alertness: Lethargic Behavior During Therapy: Flat affect Overall Cognitive Status: Difficult to assess Area of Impairment: Memory, Following commands, Safety/judgement, Problem solving                     Memory: Decreased short-term memory Following Commands: Follows one step commands inconsistently Safety/Judgement: Decreased awareness of safety, Decreased awareness of deficits   Problem Solving: Slow processing, Decreased initiation, Difficulty sequencing, Requires verbal cues, Requires tactile  cues General Comments: difficult to assess due to lethargy and limited alertness/participation with session        Exercises General Exercises - Lower Extremity Ankle Circles/Pumps: AAROM, Both, 5 reps Heel Slides: AAROM, Both, 5 reps Hip ABduction/ADduction: AAROM, Both, 5 reps    General Comments General comments (skin integrity, edema, etc.):  (Pt with increased lethargy this date.  Possible result of receiving Trazadone last night. MD notified)      Pertinent Vitals/Pain Pain Assessment Pain Assessment: No/denies pain    Home Living                          Prior Function            PT Goals (current goals can now be found in the care plan section) Acute Rehab PT Goals Patient Stated Goal: ambulate to go home    Frequency    7X/week      PT Plan Current plan remains appropriate    Co-evaluation              AM-PAC PT "6 Clicks" Mobility   Outcome Measure  Help needed turning from your back to your side while in a flat bed without using bedrails?: Total Help needed moving from lying on your back to sitting on the side of a flat bed without using bedrails?: Total Help needed moving to and from a bed to a chair (including a wheelchair)?: Total Help needed standing up from a chair using your arms (e.g., wheelchair or bedside chair)?: Total Help needed to walk in hospital room?: Total Help needed climbing 3-5 steps with a railing? : Total 6 Click Score: 6    End of Session   Activity Tolerance: Patient limited by lethargy Patient left: in bed;with call bell/phone within reach;with bed alarm set;with nursing/sitter in room Nurse Communication: Mobility status PT Visit Diagnosis: Difficulty in walking, not elsewhere classified (R26.2);Other abnormalities of gait and mobility (R26.89);History of falling (Z91.81);Muscle weakness (generalized) (M62.81);Unsteadiness on feet (R26.81)     Time: 1102-1140 PT Time Calculation (min) (ACUTE ONLY): 38 min  Charges:  $Therapeutic Exercise: 8-22 mins $Therapeutic Activity: 23-37 mins          Mikel Cella, PTA    Josie Dixon 11/03/2021, 12:01 PM

## 2021-11-03 NOTE — Progress Notes (Addendum)
Progress Note   Patient: Justin Griffith DOB: Jul 02, 1940 DOA: 10/30/2021     3 DOS: the patient was seen and examined on 11/03/2021   Brief hospital course: Justin Griffith is a 81 y.o. male with medical history significant of hypertension, hyperlipidemia, diabetes mellitus, COPD/asthma, stroke, depression, OSA, colon cancer, BPH with urinary retention with chronic indwelling catheter placement, PE on Eliquis, obesity, CAD, CABG, CKD-3B, dCHF, PAD, who presented with multiple falls.  On admission, found to have right-sided weakness in both arm and leg.    Assessment and Plan: * Stroke [facility]) --acute subcortical infarct on the left, likely small vessel infarct.  -- Consulted Dr. Leonel Ramsay of neurology.  --Patient had 2D echo on 09/20/2021 which showed EF of 60 to 65%, will not repeat 2D echo. --cont Eliquis --per neuro, would not add antiplatelet therapy while he is on anticoagulation. --cont statin  Recurrent falls Frequent falls at baseline, exacerbated by stroke -PT/OT -Fall precaution --SNF rehab  Essential hypertension - IV hydralazine as needed for SBP 220, DBP 110 - Coreg, hydralazine, Cozaar was held to allow permissive hypertension --will resume cozaar and  Coreg (decreased to 6.25 mg bid) --hold torsemide -continue home clonidine 0.1 mg twice daily to avoid rebounding   CAD (coronary artery disease) S/p of CABG.  No chest pain.  Troponin 18, then 16, which likely nonspecific change. -Continue Crestor, fenofibrate  Chronic obstructive pulmonary disease (COPD) (HCC) Stable.  Oxygen saturation 97% on room air. -Bronchodilators  History of pulmonary embolism 09/18/21 - Continue Eliquis  Type II diabetes mellitus with renal manifestations (HCC) Recent A1c 8.9, poorly controlled.  Patient taking metformin and glargine insulin 10 unit daily --increase glargine back to home 10u nightly -Sliding scale insulin  HLD (hyperlipidemia) - Crestor and  fenofibrate  Chronic diastolic CHF (congestive heart failure) ([place]) 2D echo on 09/20/2021 showed EF of 60 to 65%.  Patient does not have leg edema JVD.  CHF is compensated. -hold home torsemide  Stage 3b chronic kidney disease (CKD) ([place]) Renal function close to baseline.  Recent baseline creatinine 1.5-1.85.  His creatinine is 1.65, BUN 21. 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   -Encouraged to lose weight.   Overall improving slowly. Awaiting rehab bed.      Subjective: hard on hearing. Friend visiting today. Eating lunch  Physical Exam: Vitals:   11/03/21 0445 11/03/21 0742 11/03/21 0750 11/03/21 1257  BP: (!) 141/72 131/66  120/63  Pulse: (!) 57 (!) 54  (!) 56  Resp: '16 17  20  '$ Temp: 97.7 F (36.5 C) 98.4 F (36.9 C)  97.9 F (36.6 C)  TempSrc: Oral     SpO2: 95% 94% 90% 96%  Weight:      Height:       Constitutional: NAD, AAOx3 HEENT: conjunctivae and lids normal, EOMI, HOH+ CV: No cyanosis.   RESP: normal respiratory effort, on RA SKIN: warm, dry Neuro:   Grip strength weaker on the right. Psych: Normal mood and affect.  Appropriate judgement and reason    Family Communication: none today  Disposition: Status is: Inpatient Remains inpatient appropriate because: awaiting rehab bed availibility   Planned Discharge Destination: Skilled nursing facility    Time spent: 35 minutes  Author:  Fritzi Mandes, MD 11/03/2021 1:09 PM  For on call review [email or web address].

## 2021-11-04 DIAGNOSIS — N179 Acute kidney failure, unspecified: Secondary | ICD-10-CM | POA: Diagnosis not present

## 2021-11-04 DIAGNOSIS — E785 Hyperlipidemia, unspecified: Secondary | ICD-10-CM | POA: Diagnosis present

## 2021-11-04 DIAGNOSIS — A4181 Sepsis due to Enterococcus: Secondary | ICD-10-CM | POA: Diagnosis present

## 2021-11-04 DIAGNOSIS — Z87891 Personal history of nicotine dependence: Secondary | ICD-10-CM | POA: Diagnosis not present

## 2021-11-04 DIAGNOSIS — I69351 Hemiplegia and hemiparesis following cerebral infarction affecting right dominant side: Secondary | ICD-10-CM | POA: Diagnosis not present

## 2021-11-04 DIAGNOSIS — E669 Obesity, unspecified: Secondary | ICD-10-CM | POA: Diagnosis present

## 2021-11-04 DIAGNOSIS — Z7189 Other specified counseling: Secondary | ICD-10-CM | POA: Diagnosis not present

## 2021-11-04 DIAGNOSIS — I2510 Atherosclerotic heart disease of native coronary artery without angina pectoris: Secondary | ICD-10-CM | POA: Diagnosis not present

## 2021-11-04 DIAGNOSIS — G4733 Obstructive sleep apnea (adult) (pediatric): Secondary | ICD-10-CM | POA: Diagnosis not present

## 2021-11-04 DIAGNOSIS — Z833 Family history of diabetes mellitus: Secondary | ICD-10-CM | POA: Diagnosis not present

## 2021-11-04 DIAGNOSIS — N390 Urinary tract infection, site not specified: Secondary | ICD-10-CM | POA: Diagnosis not present

## 2021-11-04 DIAGNOSIS — Z85038 Personal history of other malignant neoplasm of large intestine: Secondary | ICD-10-CM | POA: Diagnosis not present

## 2021-11-04 DIAGNOSIS — J181 Lobar pneumonia, unspecified organism: Secondary | ICD-10-CM | POA: Diagnosis not present

## 2021-11-04 DIAGNOSIS — R531 Weakness: Secondary | ICD-10-CM | POA: Diagnosis not present

## 2021-11-04 DIAGNOSIS — N189 Chronic kidney disease, unspecified: Secondary | ICD-10-CM | POA: Diagnosis not present

## 2021-11-04 DIAGNOSIS — Z515 Encounter for palliative care: Secondary | ICD-10-CM | POA: Diagnosis not present

## 2021-11-04 DIAGNOSIS — K219 Gastro-esophageal reflux disease without esophagitis: Secondary | ICD-10-CM | POA: Diagnosis not present

## 2021-11-04 DIAGNOSIS — I130 Hypertensive heart and chronic kidney disease with heart failure and stage 1 through stage 4 chronic kidney disease, or unspecified chronic kidney disease: Secondary | ICD-10-CM | POA: Diagnosis present

## 2021-11-04 DIAGNOSIS — E1151 Type 2 diabetes mellitus with diabetic peripheral angiopathy without gangrene: Secondary | ICD-10-CM | POA: Diagnosis present

## 2021-11-04 DIAGNOSIS — I10 Essential (primary) hypertension: Secondary | ICD-10-CM | POA: Diagnosis not present

## 2021-11-04 DIAGNOSIS — E872 Acidosis, unspecified: Secondary | ICD-10-CM | POA: Diagnosis present

## 2021-11-04 DIAGNOSIS — I5032 Chronic diastolic (congestive) heart failure: Secondary | ICD-10-CM | POA: Diagnosis present

## 2021-11-04 DIAGNOSIS — Z794 Long term (current) use of insulin: Secondary | ICD-10-CM | POA: Diagnosis not present

## 2021-11-04 DIAGNOSIS — Y846 Urinary catheterization as the cause of abnormal reaction of the patient, or of later complication, without mention of misadventure at the time of the procedure: Secondary | ICD-10-CM | POA: Diagnosis present

## 2021-11-04 DIAGNOSIS — R9431 Abnormal electrocardiogram [ECG] [EKG]: Secondary | ICD-10-CM | POA: Diagnosis not present

## 2021-11-04 DIAGNOSIS — E875 Hyperkalemia: Secondary | ICD-10-CM | POA: Diagnosis not present

## 2021-11-04 DIAGNOSIS — E871 Hypo-osmolality and hyponatremia: Secondary | ICD-10-CM | POA: Diagnosis present

## 2021-11-04 DIAGNOSIS — Z66 Do not resuscitate: Secondary | ICD-10-CM | POA: Diagnosis not present

## 2021-11-04 DIAGNOSIS — E7800 Pure hypercholesterolemia, unspecified: Secondary | ICD-10-CM | POA: Diagnosis not present

## 2021-11-04 DIAGNOSIS — N401 Enlarged prostate with lower urinary tract symptoms: Secondary | ICD-10-CM | POA: Diagnosis not present

## 2021-11-04 DIAGNOSIS — Z7901 Long term (current) use of anticoagulants: Secondary | ICD-10-CM | POA: Diagnosis not present

## 2021-11-04 DIAGNOSIS — N1832 Chronic kidney disease, stage 3b: Secondary | ICD-10-CM | POA: Diagnosis present

## 2021-11-04 DIAGNOSIS — M6281 Muscle weakness (generalized): Secondary | ICD-10-CM | POA: Diagnosis not present

## 2021-11-04 DIAGNOSIS — R6521 Severe sepsis with septic shock: Secondary | ICD-10-CM | POA: Diagnosis not present

## 2021-11-04 DIAGNOSIS — R1030 Lower abdominal pain, unspecified: Secondary | ICD-10-CM | POA: Diagnosis not present

## 2021-11-04 DIAGNOSIS — L89150 Pressure ulcer of sacral region, unstageable: Secondary | ICD-10-CM | POA: Diagnosis present

## 2021-11-04 DIAGNOSIS — F32A Depression, unspecified: Secondary | ICD-10-CM | POA: Diagnosis not present

## 2021-11-04 DIAGNOSIS — T83518A Infection and inflammatory reaction due to other urinary catheter, initial encounter: Secondary | ICD-10-CM | POA: Diagnosis present

## 2021-11-04 DIAGNOSIS — H9190 Unspecified hearing loss, unspecified ear: Secondary | ICD-10-CM | POA: Diagnosis not present

## 2021-11-04 DIAGNOSIS — E538 Deficiency of other specified B group vitamins: Secondary | ICD-10-CM | POA: Diagnosis not present

## 2021-11-04 DIAGNOSIS — J449 Chronic obstructive pulmonary disease, unspecified: Secondary | ICD-10-CM | POA: Diagnosis not present

## 2021-11-04 DIAGNOSIS — E559 Vitamin D deficiency, unspecified: Secondary | ICD-10-CM | POA: Diagnosis not present

## 2021-11-04 DIAGNOSIS — D631 Anemia in chronic kidney disease: Secondary | ICD-10-CM | POA: Diagnosis present

## 2021-11-04 DIAGNOSIS — Z951 Presence of aortocoronary bypass graft: Secondary | ICD-10-CM | POA: Diagnosis not present

## 2021-11-04 DIAGNOSIS — J9811 Atelectasis: Secondary | ICD-10-CM | POA: Diagnosis not present

## 2021-11-04 DIAGNOSIS — I4891 Unspecified atrial fibrillation: Secondary | ICD-10-CM | POA: Diagnosis present

## 2021-11-04 DIAGNOSIS — I252 Old myocardial infarction: Secondary | ICD-10-CM | POA: Diagnosis not present

## 2021-11-04 DIAGNOSIS — A419 Sepsis, unspecified organism: Secondary | ICD-10-CM | POA: Diagnosis not present

## 2021-11-04 DIAGNOSIS — E1122 Type 2 diabetes mellitus with diabetic chronic kidney disease: Secondary | ICD-10-CM | POA: Diagnosis present

## 2021-11-04 DIAGNOSIS — Z20822 Contact with and (suspected) exposure to covid-19: Secondary | ICD-10-CM | POA: Diagnosis present

## 2021-11-04 DIAGNOSIS — J90 Pleural effusion, not elsewhere classified: Secondary | ICD-10-CM | POA: Diagnosis not present

## 2021-11-04 DIAGNOSIS — J439 Emphysema, unspecified: Secondary | ICD-10-CM | POA: Diagnosis present

## 2021-11-04 DIAGNOSIS — Z7984 Long term (current) use of oral hypoglycemic drugs: Secondary | ICD-10-CM | POA: Diagnosis not present

## 2021-11-04 DIAGNOSIS — Z466 Encounter for fitting and adjustment of urinary device: Secondary | ICD-10-CM | POA: Diagnosis not present

## 2021-11-04 DIAGNOSIS — R4182 Altered mental status, unspecified: Secondary | ICD-10-CM | POA: Diagnosis not present

## 2021-11-04 LAB — GLUCOSE, CAPILLARY: Glucose-Capillary: 152 mg/dL — ABNORMAL HIGH (ref 70–99)

## 2021-11-04 MED ORDER — CARVEDILOL 3.125 MG PO TABS: 3.1250 mg | ORAL_TABLET | Freq: Two times a day (BID) | ORAL | 1 refills | Status: DC

## 2021-11-04 MED ORDER — CLONIDINE HCL 0.1 MG PO TABS
0.1000 mg | ORAL_TABLET | Freq: Two times a day (BID) | ORAL | Status: DC
  Administered 2021-11-04: 0.1 mg via ORAL
  Filled 2021-11-04: qty 1

## 2021-11-04 MED ORDER — AMLODIPINE BESYLATE 10 MG PO TABS
10.0000 mg | ORAL_TABLET | Freq: Every day | ORAL | Status: DC
  Administered 2021-11-04: 10 mg via ORAL
  Filled 2021-11-04: qty 1

## 2021-11-04 MED ORDER — CARVEDILOL 3.125 MG PO TABS: 3.1250 mg | ORAL_TABLET | Freq: Two times a day (BID) | ORAL | Status: DC

## 2021-11-04 MED ORDER — LOSARTAN POTASSIUM 50 MG PO TABS: 50.0000 mg | ORAL_TABLET | Freq: Every day | ORAL | 1 refills | Status: DC

## 2021-11-04 MED ORDER — CYANOCOBALAMIN 1000 MCG PO TABS: 1000.0000 ug | ORAL_TABLET | Freq: Every day | ORAL | 1 refills | Status: DC

## 2021-11-04 MED ORDER — HYDRALAZINE HCL 50 MG PO TABS
50.0000 mg | ORAL_TABLET | Freq: Three times a day (TID) | ORAL | Status: DC
  Administered 2021-11-04: 50 mg via ORAL
  Filled 2021-11-04: qty 1

## 2021-11-04 NOTE — TOC Progression Note (Signed)
Transition of Care [facility]) - Progression Note    Patient Details  Name: Justin Griffith MRN: [id number] Date of Birth: 09-01-1940  Transition of Care [facility]) CM/SW [place], RN Phone Number: 11/04/2021, 9:11 AM  Clinical Narrative:   Approval for SNF per HTA [phone] initial 7 days as per Marlowe Kays.  Daughter advised.  Magda Paganini at liberty aware.    Expected Discharge Plan: [place] Barriers to Discharge: Continued Medical Work up  Expected Discharge Plan and Services Expected Discharge Plan: [place] In-house Referral: Clinical Social Work   Post Acute Care Choice: [place] Living arrangements for the past 2 months: Single Family Home                                       Social Determinants of Health (SDOH) Interventions    Readmission Risk Interventions    09/21/2021   10:42 AM 05/21/2020   10:01 AM 05/18/2020   12:17 PM  Readmission Risk Prevention Plan  Transportation Screening Complete Complete Complete  PCP or Specialist Appt within 3-5 Days Complete Complete   HRI or Home Care Consult Complete Complete Complete  Social Work Consult for [place] Planning/Counseling Complete Complete Complete  Palliative Care Screening Not Applicable Not Applicable Not Applicable  Medication Review Press photographer) Complete Complete Complete

## 2021-11-04 NOTE — Progress Notes (Signed)
Patient being discharged to [facility], discharge instructions provided in packet and report called to staff at [facility]. Foley catheter to stay in and peripheral IV removed. Ems to transfer patient to [facility].

## 2021-11-04 NOTE — Discharge Summary (Addendum)
Physician Discharge Summary   Patient: Justin Griffith MRN: [id number] DOB: [birth year]  Admit date:     10/30/2021  Discharge date: 11/04/21  Discharge Physician: Fritzi Mandes   PCP: [name]   Recommendations at discharge:   patient to follow-up closely with [facility] for his urinary retention and chronic Foley. Patient will need to get foley changed early September at urology office. F/u PCP in 1-2 weeks  Discharge Diagnoses: acute CVA subacute left frontal coronary radiata  Justin Griffith is a 81 y.o. male with medical history significant of hypertension, hyperlipidemia, diabetes mellitus, COPD/asthma, stroke, depression, OSA, colon cancer, BPH with urinary retention with chronic indwelling catheter placement, PE on Eliquis, obesity, CAD, CABG, CKD-3B, dCHF, PAD, who presented with multiple falls.  On admission, found to have right-sided weakness in both arm and leg.     Assessment and Plan: * Stroke [facility]) --acute subcortical infarct on the left frontal corona radiata, likely small vessel infarct.  -- Consulted Dr. Leonel Ramsay of neurology.   --Patient had 2D echo on 09/20/2021 which showed EF of 60 to 65%, will not repeat 2D echo. --cont Eliquis --per neuro, would not add antiplatelet therapy while he is on anticoagulation. --cont statin   Recurrent falls Frequent falls at baseline, exacerbated by stroke -PT/OT -Fall precaution --SNF rehab   Essential hypertension - IV hydralazine as needed for SBP 220, DBP 110 - Coreg, hydralazine, Cozaar was held to allow permissive hypertension --will resume cozaar and  Coreg (decreased to 6.25 mg bid) --hold torsemide -continue home clonidine 0.1 mg twice daily to avoid rebounding   CAD (coronary artery disease) S/p of CABG.  No chest pain.  Troponin 18, then 16, which likely nonspecific change. -Continue Crestor, fenofibrate   Chronic obstructive pulmonary disease (COPD) (HCC) Stable.  Oxygen  saturation 97% on room air. -Bronchodilators   History of pulmonary embolism 09/18/21 - Continue Eliquis   Type II diabetes mellitus with renal manifestations (HCC) Recent A1c 8.9, poorly controlled.  Patient taking metformin and glargine insulin 10 unit daily --increase glargine back to home 10u nightly -Sliding scale insulin   HLD (hyperlipidemia) - Crestor and fenofibrate   Chronic diastolic CHF (congestive heart failure) ([place]) 2D echo on 09/20/2021 showed EF of 60 to 65%.  Patient does not have leg edema JVD.  CHF is compensated. -hold home torsemide   Stage 3b chronic kidney disease (CKD) ([place]) Renal function close to baseline.  Recent baseline creatinine 1.5-1.85.  His creatinine is 1.65, BUN 21. 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 urology associate.   Obesity with body mass index (BMI) of 30.0 to 39.9   BMI= 36.26 and BW= 117.9 -Diet and exercise.   -Encouraged to lose weight.     Overall improving slowly. Patient will go to [facility] today. Discharge plan was discussed with patient's daughter on the phone.  Patient has multiple medical problems is at a high risk for readmission.  pt will benefit from out patient Palliative care f/u       Consultants: neurology Procedures performed: none  Disposition: Rehabilitation facility Diet recommendation:  Discharge Diet Orders (From admission, onward)     Start     Ordered   11/04/21 0000  Diet - low sodium heart healthy  11/04/21 0928   11/04/21 0000  Diet Carb Modified        11/04/21 0928           Cardiac and Carb modified diet DISCHARGE MEDICATION: Allergies as of 11/04/2021       Reactions   Farxiga [dapagliflozin] Rash   Causes a severe  rash in groin area   Dulaglutide Diarrhea, Other (See Comments)   Liraglutide Diarrhea, Other (See Comments)   Jardiance [empagliflozin] Rash        Medication List     STOP taking these medications    oxybutynin 5 MG tablet Commonly known as: DITROPAN   Torsemide 40 MG Tabs       TAKE these medications    apixaban 5 MG Tabs tablet Commonly known as: Eliquis Take 1 tablet (5 mg total) by mouth 2 (two) times daily.   Breztri Aerosphere 160-9-4.8 MCG/ACT Aero Generic drug: Budeson-Glycopyrrol-Formoterol Inhale 1 puff into the lungs daily.   carvedilol 3.125 MG tablet Commonly known as: COREG Take 1 tablet (3.125 mg total) by mouth 2 (two) times daily with a meal. What changed:  medication strength how much to take when to take this   cloNIDine 0.1 MG tablet Commonly known as: CATAPRES Take 0.1 mg by mouth 2 (two) times daily.   cyanocobalamin 1000 MCG tablet Take 1 tablet (1,000 mcg total) by mouth at bedtime. What changed:  medication strength how much to take   fenofibrate 145 MG tablet Commonly known as: Tricor Take 1 tablet (145 mg total) by mouth daily.   gabapentin 600 MG tablet Commonly known as: NEURONTIN Take 600 mg by mouth 2 (two) times daily.   hydrALAZINE 50 MG tablet Commonly known as: APRESOLINE TAKE ONE TABLET BY MOUTH THREE TIMES A DAY FOR BLOOD PRESSURE   insulin glargine-yfgn 100 UNIT/ML Pen Commonly known as: SEMGLEE INJECT 10 UNITS UNDER SKIN ONCE EVERY DAY FOR DIABETES DISCARD PEN 28 DAYS AFTER OPENING. * GIVE AT THE SAME TIME DAILY *   lidocaine 5 % Commonly known as: Lidoderm Place 1 patch onto the skin daily. Remove & Discard patch within 12 hours or as directed by MD   losartan 50 MG tablet Commonly known as: COZAAR Take 1 tablet (50 mg total) by mouth daily. Start taking on: November 05, 2021 What changed:  medication strength how much to take   magnesium oxide 400 (240 Mg) MG tablet Commonly known as: MAG-OX Take 1  tablet (400 mg total) by mouth daily.   metFORMIN 1000 MG tablet Commonly known as: GLUCOPHAGE Take 1 tablet (1,000 mg total) by mouth 2 (two) times daily with a meal.   mirabegron ER 50 MG Tb24 tablet Commonly known as: MYRBETRIQ Take 1 tablet (50 mg total) by mouth daily.   nortriptyline 10 MG capsule Commonly known as: PAMELOR Take 10 mg by mouth 2 (two) times daily.   rosuvastatin 40 MG tablet Commonly known as: CRESTOR Take 1 tablet (40 mg total) by mouth at bedtime.   Spiriva Respimat 2.5 MCG/ACT Aers Generic drug: Tiotropium Bromide Monohydrate Inhale 2 puffs into the lungs daily.   Vitamin D (Cholecalciferol) 25 MCG (1000 UT) Caps Take 2,000 mcg by mouth at bedtime.               Discharge Care Instructions  (From admission, onward)           Start     Ordered   11/04/21 0000  Discharge wound care:       Comments:  Pressure Injury Buttocks Bilateral Deep Tissue Pressure Injury - Purple or maroon localized area of discolored intact skin or blood-filled blister due to damage of underlying soft tissue from pressure and/or shear. -- days     11/04/21 0928            Follow-up Information     [name]. Schedule an appointment as soon as possible for a visit in 1 week(s).   Why: hospital f/u Contact information: [place] [address] [phone]         Debroah Loop, PA-C. Go to.   Specialty: Urology Why: on your scheduled appt Contact information: [place] [address] [phone]                Discharge Exam: Danley Danker Weights   10/31/21 0911  Weight: 117.9 kg     Condition at discharge: fair  The results of significant diagnostics from this hospitalization (including imaging, microbiology, ancillary and laboratory) are listed below for reference.   Imaging Studies: MR ANGIO HEAD WO CONTRAST  Result Date: 11/01/2021 CLINICAL DATA:  Follow-up examination for stroke.  EXAM: MRA HEAD WITHOUT CONTRAST TECHNIQUE: Angiographic images of the Circle of Willis were acquired using MRA technique without intravenous contrast. COMPARISON:  Comparison made with prior brain MRI from 10/31/2021. FINDINGS: Anterior circulation: Visualized distal cervical segments of the internal carotid arteries are widely patent with antegrade flow. Petrous, cavernous, and supraclinoid segments patent without stenosis or other abnormality. ICA termini well perfused. A1 segments patent bilaterally. Normal anterior communicating artery complex. Anterior cerebral arteries patent without stenosis. No M1 stenosis or occlusion. Normal MCA bifurcations. No proximal MCA branch occlusion. Distal MCA branches perfused and symmetric. Posterior circulation: Both vertebral arteries patent to the vertebrobasilar junction without stenosis. Right vertebral artery dominant. Both PICA patent. Basilar patent to its distal aspect without stenosis. Superior cerebral arteries patent bilaterally. Both PCA supplied via the basilar as well as robust bilateral posterior communicating arteries. Both PCAs patent to their distal aspects without stenosis. Anatomic variants: None significant. Other: No intracranial aneurysm. IMPRESSION: Normal intracranial MRA. No large vessel occlusion. No hemodynamically significant or correctable stenosis. Electronically Signed   By: Jeannine Boga M.D.   On: 11/01/2021 05:16   US Carotid Bilateral (at [facility] and [facility] only)  Result Date: 10/31/2021 CLINICAL DATA:  Stroke symptoms, hypertension, hyperlipidemia and diabetes EXAM: BILATERAL CAROTID DUPLEX ULTRASOUND TECHNIQUE: Pearline Cables scale imaging, color Doppler and duplex ultrasound were performed of bilateral carotid and vertebral arteries in the neck. COMPARISON:  None Available. FINDINGS: Criteria: Quantification of carotid stenosis is based on velocity parameters that correlate the residual internal carotid diameter with NASCET-based stenosis  levels, using the diameter of the distal internal carotid lumen as the denominator for stenosis measurement. The following velocity measurements were obtained: RIGHT ICA: 62/16 cm/sec CCA: 88/41 cm/sec SYSTOLIC ICA/CCA RATIO:  1.5 ECA: 149 cm/sec LEFT ICA: 69/19 cm/sec CCA: 66/06 cm/sec SYSTOLIC ICA/CCA RATIO:  1.0 ECA: 97 cm/sec RIGHT CAROTID ARTERY: Intimal thickening and mild bifurcation calcified plaque formation. Negative for significant stenosis, velocity elevation, turbulent flow. Degree of narrowing less than 50% by ultrasound criteria. RIGHT VERTEBRAL ARTERY:  Normal antegrade flow LEFT CAROTID ARTERY: Similar bifurcation calcified atherosclerosis. Despite this, no significant stenosis, velocity elevation, turbulent flow. Degree of narrowing also less than 50% by ultrasound criteria. LEFT VERTEBRAL ARTERY:  Normal antegrade flow IMPRESSION: Bilateral carotid atherosclerosis. Negative for significant stenosis. Degree of narrowing less than 50% bilaterally by ultrasound criteria. Patent antegrade vertebral  flow bilaterally Electronically Signed   By: Jerilynn Mages.  Shick M.D.   On: 10/31/2021 11:27   MR BRAIN WO CONTRAST  Result Date: 10/31/2021 CLINICAL DATA:  Initial evaluation for acute neuro deficit, stroke. EXAM: MRI HEAD WITHOUT CONTRAST TECHNIQUE: Multiplanar, multiecho pulse sequences of the brain and surrounding structures were obtained without intravenous contrast. COMPARISON:  CT from 10/30/2021. FINDINGS: Brain: Examination mildly degraded by motion artifact. Diffuse prominence of the CSF containing spaces compatible generalized cerebral atrophy. Patchy and confluent T2/FLAIR hyperintensity involving the periventricular and deep white matter both cerebral hemispheres, most consistent with chronic small vessel ischemic disease, moderately advanced in nature. Mild involvement of the pons noted. Encephalomalacia of involving the inferior left cerebellum consistent with a remote left PICA distribution  infarct. Approximate 1.3 cm area of mild diffusion signal abnormality involving the deep white matter of the left frontal corona radiata is seen (series 5, image 31). Associated T2/FLAIR signal intensity without convincing ADC correlate. This likely reflects an evolving subacute small vessel infarct. No associated hemorrhage or mass effect. Additionally, there is a possible 4 mm focus of restricted diffusion involving the mesial right temporal lobe, seen only on coronal DWI sequence (series 7, image 23), which could reflect an additional small acute to early subacute ischemic infarct. No associated hemorrhage or mass effect. No other evidence for acute or subacute ischemia. Gray-white matter differentiation otherwise maintained. No other significant acute or chronic intracranial blood products. No mass lesion, midline shift or mass effect. Mild ventricular prominence related to global parenchymal volume loss without hydrocephalus. No extra-axial fluid collection. Pituitary gland and suprasellar region within normal limits. Vascular: Major intracranial vascular flow voids are maintained. Skull and upper cervical spine: Craniocervical junction within normal limits. Bone marrow signal intensity grossly normal. No scalp soft tissue abnormality. Sinuses/Orbits: Prior bilateral ocular lens replacement. Paranasal sinuses are largely clear. Small chronic appearing bilateral mastoid effusions. Visualized nasopharynx unremarkable. Other: None. IMPRESSION: 1. 1.3 cm focus of mild diffusion signal abnormality involving the deep white matter of the left frontal corona radiata, likely an evolving subacute small vessel infarct. No associated hemorrhage or mass effect. 2. Additional 4 mm focus of possible restricted diffusion involving the mesial right temporal lobe, suspicious for an additional small acute to early subacute nonhemorrhagic ischemic infarct. 3. Underlying age-related cerebral atrophy with moderate chronic  microvascular ischemic disease. 4. Remote left PICA distribution infarct. Electronically Signed   By: Jeannine Boga M.D.   On: 10/31/2021 03:03   DG Knee Complete 4 Views Right  Result Date: 10/31/2021 CLINICAL DATA:  Fall injury with bilateral knee and bilateral ankle pain and chest discomfort. EXAM: RIGHT KNEE - COMPLETE 4+ VIEW; CHEST - 2 VIEW; LEFT ANKLE COMPLETE - 3+ VIEW; RIGHT ANKLE - COMPLETE 3+ VIEW; LEFT KNEE - COMPLETE 4+ VIEW COMPARISON:  Portable chest 09/19/2021, bilateral knee series 07/29/2006. No prior ankle series either side. FINDINGS: Chest: Mild cardiomegaly without evidence of CHF. Stable mediastinal silhouette with slight aortic tortuosity, calcification in the arch. The lungs are clear. No pleural effusion is seen. Moderate thoracic spondylosis. Right knee: Normal bone mineralization.  No fracture or dislocation is seen. Small suprapatellar effusion is noted and a total knee arthroplasty without evidence of loosening. There are multiple small ossific rounded loose bodies along the joint margins versus extra-articular bony debris, not seen on the prior postoperative study. There is mild soft tissue swelling anteriorly. Patchy calcification newly noted in the distal femoral artery, popliteal and popliteal trifurcation arteries. Left knee: There is normal bone mineralization  without evidence of fractures. Small suprapatellar effusion is seen. Total joint arthroplasty is intact without evidence of loosening. There are multiple small rounded ossific bodies scattered around the periphery of the joint space versus extra-articular ossific debris not seen previously. The superficial soft tissues are unremarkable. Patchy calcification developed since the prior study in the distal femoral, popliteal and popliteal trifurcation arteries. Right ankle: There is mild to moderate swelling. There are calcifications in the anterior and posterior tibial arteries. Normal bone mineralization without  evidence of acute fracture. The mortise is symmetric but there is spurring of the undersurface of both malleoli. There is an ovoid ossicle inferior to the medial malleolus which could be an old chip fracture, intra-articular loose body or unfused ossification center. 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 No proximal metatarsal fracture seen. Left ankle: There is mild generalized soft tissue fullness. There are calcifications in the anterior and posterior tibial arteries. 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 The mortise is symmetric. There is slight spurring of the undersurface of both malleoli. Bony debris is seen inferior to the lateral malleolus with chronic appearance and could be dystrophic calcifications from remote trauma or loose bodies. 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 The proximal metatarsals are intact as visualized. IMPRESSION: 1. No evidence of acute chest disease. Stable chest with mild cardiomegaly and aortic atherosclerosis. 2. No evidence of acute fractures of the bilateral knees and bilateral ankles. 3. Anterior swelling of the right knee and bilateral swelling at the ankles right-greater-than-left. 4. Small bilateral suprapatellar effusions. 5. Postsurgical and degenerative changes described above. 6. Interval development of vascular calcifications compared with 15 years ago. Electronically Signed   By: Telford Nab M.D.   On: 10/31/2021 01:03   DG Knee Complete 4 Views Left  Result Date: 10/31/2021 CLINICAL DATA:  Fall injury with bilateral knee and bilateral ankle pain and chest discomfort. EXAM: RIGHT KNEE - COMPLETE 4+ VIEW; CHEST - 2 VIEW; LEFT ANKLE COMPLETE - 3+ VIEW; RIGHT ANKLE - COMPLETE 3+ VIEW; LEFT KNEE - COMPLETE 4+ VIEW COMPARISON:  Portable  chest 09/19/2021, bilateral knee series 07/29/2006. No prior ankle series either side. FINDINGS: Chest: Mild cardiomegaly without evidence of CHF. Stable mediastinal silhouette with slight aortic tortuosity, calcification in the arch. The lungs are clear. No pleural effusion is seen. Moderate thoracic spondylosis. Right knee: Normal bone mineralization.  No fracture or dislocation is seen. Small suprapatellar effusion is noted and a total knee arthroplasty without evidence of loosening. There are multiple small ossific rounded loose bodies along the joint margins versus extra-articular bony debris, not seen on the prior postoperative study. There is mild soft tissue swelling anteriorly. Patchy calcification newly noted in the distal femoral artery, popliteal and popliteal trifurcation arteries. Left knee: There is normal bone mineralization without evidence of fractures. Small suprapatellar effusion is seen. Total joint arthroplasty is intact without evidence of loosening. There are multiple small rounded ossific bodies scattered around the periphery of the joint space versus extra-articular ossific debris not seen previously. The superficial soft tissues are unremarkable. Patchy calcification developed since the prior study in the distal femoral, popliteal and popliteal trifurcation arteries. Right ankle: There is mild to moderate swelling. There are calcifications in the anterior and posterior tibial arteries. Normal bone mineralization without evidence of acute fracture. The mortise is symmetric but there is spurring of  the undersurface of both malleoli. There is an ovoid ossicle inferior to the medial malleolus which could be an old chip fracture, intra-articular loose body or unfused ossification center. 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 No proximal metatarsal fracture seen. Left ankle: There is mild generalized soft tissue  fullness. There are calcifications in the anterior and posterior tibial arteries. 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 The mortise is symmetric. There is slight spurring of the undersurface of both malleoli. Bony debris is seen inferior to the lateral malleolus with chronic appearance and could be dystrophic calcifications from remote trauma or loose bodies. 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 The proximal metatarsals are intact as visualized. IMPRESSION: 1. No evidence of acute chest disease. Stable chest with mild cardiomegaly and aortic atherosclerosis. 2. No evidence of acute fractures of the bilateral knees and bilateral ankles. 3. Anterior swelling of the right knee and bilateral swelling at the ankles right-greater-than-left. 4. Small bilateral suprapatellar effusions. 5. Postsurgical and degenerative changes described above. 6. Interval development of vascular calcifications compared with 15 years ago. Electronically Signed   By: Telford Nab M.D.   On: 10/31/2021 01:03   DG Ankle Complete Right  Result Date: 10/31/2021 CLINICAL DATA:  Fall injury with bilateral knee and bilateral ankle pain and chest discomfort. EXAM: RIGHT KNEE - COMPLETE 4+ VIEW; CHEST - 2 VIEW; LEFT ANKLE COMPLETE - 3+ VIEW; RIGHT ANKLE - COMPLETE 3+ VIEW; LEFT KNEE - COMPLETE 4+ VIEW COMPARISON:  Portable chest 09/19/2021, bilateral knee series 07/29/2006. No prior ankle series either side. FINDINGS: Chest: Mild cardiomegaly without evidence of CHF. Stable mediastinal silhouette with slight aortic tortuosity, calcification in the arch. The lungs are clear. No pleural effusion is seen. Moderate thoracic spondylosis. Right knee: Normal bone mineralization.  No fracture or dislocation is seen. Small suprapatellar effusion is noted and a total knee arthroplasty without evidence  of loosening. There are multiple small ossific rounded loose bodies along the joint margins versus extra-articular bony debris, not seen on the prior postoperative study. There is mild soft tissue swelling anteriorly. Patchy calcification newly noted in the distal femoral artery, popliteal and popliteal trifurcation arteries. Left knee: There is normal bone mineralization without evidence of fractures. Small suprapatellar effusion is seen. Total joint arthroplasty is intact without evidence of loosening. There are multiple small rounded ossific bodies scattered around the periphery of the joint space versus extra-articular ossific debris not seen previously. The superficial soft tissues are unremarkable. Patchy calcification developed since the prior study in the distal femoral, popliteal and popliteal trifurcation arteries. Right ankle: There is mild to moderate swelling. There are calcifications in the anterior and posterior tibial arteries. Normal bone mineralization without evidence of acute fracture. The mortise is symmetric but there is spurring of the undersurface of both malleoli. There is an ovoid ossicle inferior to the medial malleolus which could be an old chip fracture, intra-articular loose body or unfused ossification center. 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 No proximal metatarsal fracture seen. Left ankle: There is mild generalized soft tissue fullness. There are calcifications in the anterior and posterior tibial arteries. 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 The mortise is symmetric. There is slight spurring of the undersurface  of both malleoli. Bony debris is seen inferior to the lateral malleolus with chronic appearance and could be dystrophic calcifications from remote trauma or loose bodies. There is moderate midfoot arthrosis,  moderate-sized plantar and small posterior calcaneal enthesopathic spurs. The proximal metatarsals are intact as visualized. IMPRESSION: 1. No evidence of acute chest disease. Stable chest with mild cardiomegaly and aortic atherosclerosis. 2. No evidence of acute fractures of the bilateral knees and bilateral ankles. 3. Anterior swelling of the right knee and bilateral swelling at the ankles right-greater-than-left. 4. Small bilateral suprapatellar effusions. 5. Postsurgical and degenerative changes described above. 6. Interval development of vascular calcifications compared with 15 years ago. Electronically Signed   By: Telford Nab M.D.   On: 10/31/2021 01:03   DG Ankle Complete Left  Result Date: 10/31/2021 CLINICAL DATA:  Fall injury with bilateral knee and bilateral ankle pain and chest discomfort. EXAM: RIGHT KNEE - COMPLETE 4+ VIEW; CHEST - 2 VIEW; LEFT ANKLE COMPLETE - 3+ VIEW; RIGHT ANKLE - COMPLETE 3+ VIEW; LEFT KNEE - COMPLETE 4+ VIEW COMPARISON:  Portable chest 09/19/2021, bilateral knee series 07/29/2006. No prior ankle series either side. FINDINGS: Chest: Mild cardiomegaly without evidence of CHF. Stable mediastinal silhouette with slight aortic tortuosity, calcification in the arch. The lungs are clear. No pleural effusion is seen. Moderate thoracic spondylosis. Right knee: Normal bone mineralization.  No fracture or dislocation is seen. Small suprapatellar effusion is noted and a total knee arthroplasty without evidence of loosening. There are multiple small ossific rounded loose bodies along the joint margins versus extra-articular bony debris, not seen on the prior postoperative study. There is mild soft tissue swelling anteriorly. Patchy calcification newly noted in the distal femoral artery, popliteal and popliteal trifurcation arteries. Left knee: There is normal bone mineralization without evidence of fractures. Small suprapatellar effusion is seen. Total joint arthroplasty is intact  without evidence of loosening. There are multiple small rounded ossific bodies scattered around the periphery of the joint space versus extra-articular ossific debris not seen previously. The superficial soft tissues are unremarkable. Patchy calcification developed since the prior study in the distal femoral, popliteal and popliteal trifurcation arteries. Right ankle: There is mild to moderate swelling. There are calcifications in the anterior and posterior tibial arteries. Normal bone mineralization without evidence of acute fracture. The mortise is symmetric but there is spurring of the undersurface of both malleoli. There is an ovoid ossicle inferior to the medial malleolus which could be an old chip fracture, intra-articular loose body or unfused ossification center. 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 No proximal metatarsal fracture seen. Left ankle: There is mild generalized soft tissue fullness. There are calcifications in the anterior and posterior tibial arteries. 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 The mortise is symmetric. There is slight spurring of the undersurface of both malleoli. Bony debris is seen inferior to the lateral malleolus with chronic appearance and could be dystrophic calcifications from remote trauma or loose bodies. 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 The proximal metatarsals are intact as visualized. IMPRESSION: 1. No evidence of acute chest disease. Stable chest with mild cardiomegaly and aortic atherosclerosis. 2. No evidence of acute fractures of the bilateral knees and bilateral ankles. 3. Anterior swelling of the right knee and bilateral swelling at the ankles right-greater-than-left. 4. Small bilateral suprapatellar effusions. 5. Postsurgical and  degenerative  changes described above. 6. Interval development of vascular calcifications compared with 15 years ago. Electronically Signed   By: Telford Nab M.D.   On: 10/31/2021 01:03   DG Chest 2 View  Result Date: 10/31/2021 CLINICAL DATA:  Fall injury with bilateral knee and bilateral ankle pain and chest discomfort. EXAM: RIGHT KNEE - COMPLETE 4+ VIEW; CHEST - 2 VIEW; LEFT ANKLE COMPLETE - 3+ VIEW; RIGHT ANKLE - COMPLETE 3+ VIEW; LEFT KNEE - COMPLETE 4+ VIEW COMPARISON:  Portable chest 09/19/2021, bilateral knee series 07/29/2006. No prior ankle series either side. FINDINGS: Chest: Mild cardiomegaly without evidence of CHF. Stable mediastinal silhouette with slight aortic tortuosity, calcification in the arch. The lungs are clear. No pleural effusion is seen. Moderate thoracic spondylosis. Right knee: Normal bone mineralization.  No fracture or dislocation is seen. Small suprapatellar effusion is noted and a total knee arthroplasty without evidence of loosening. There are multiple small ossific rounded loose bodies along the joint margins versus extra-articular bony debris, not seen on the prior postoperative study. There is mild soft tissue swelling anteriorly. Patchy calcification newly noted in the distal femoral artery, popliteal and popliteal trifurcation arteries. Left knee: There is normal bone mineralization without evidence of fractures. Small suprapatellar effusion is seen. Total joint arthroplasty is intact without evidence of loosening. There are multiple small rounded ossific bodies scattered around the periphery of the joint space versus extra-articular ossific debris not seen previously. The superficial soft tissues are unremarkable. Patchy calcification developed since the prior study in the distal femoral, popliteal and popliteal trifurcation arteries. Right ankle: There is mild to moderate swelling. There are calcifications in the anterior and posterior tibial arteries. Normal bone  mineralization without evidence of acute fracture. The mortise is symmetric but there is spurring of the undersurface of both malleoli. There is an ovoid ossicle inferior to the medial malleolus which could be an old chip fracture, intra-articular loose body or unfused ossification center. 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 No proximal metatarsal fracture seen. Left ankle: There is mild generalized soft tissue fullness. There are calcifications in the anterior and posterior tibial arteries. 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 The mortise is symmetric. There is slight spurring of the undersurface of both malleoli. Bony debris is seen inferior to the lateral malleolus with chronic appearance and could be dystrophic calcifications from remote trauma or loose bodies. 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 The proximal metatarsals are intact as visualized. IMPRESSION: 1. No evidence of acute chest disease. Stable chest with mild cardiomegaly and aortic atherosclerosis. 2. No evidence of acute fractures of the bilateral knees and bilateral ankles. 3. Anterior swelling of the right knee and bilateral swelling at the ankles right-greater-than-left. 4. Small bilateral suprapatellar effusions. 5. Postsurgical and degenerative changes described above. 6. Interval development of vascular calcifications compared with 15 years ago. Electronically Signed   By: Telford Nab M.D.   On: 10/31/2021 01:03   CT Maxillofacial Wo Contrast  Result Date: 10/30/2021 CLINICAL DATA:  Facial trauma.  Fall.  Patient on Eliquis. EXAM: CT MAXILLOFACIAL WITHOUT CONTRAST TECHNIQUE: Multidetector CT imaging of the maxillofacial structures was performed. Multiplanar CT image reconstructions were also generated.  RADIATION DOSE REDUCTION: This exam was performed according to the departmental dose-optimization program which includes automated exposure control, adjustment of the mA and/or kV according to patient size  and/or use of iterative reconstruction technique. COMPARISON:  CT head without contrast 09/19/2021 FINDINGS: Osseous: No acute fractures are present. The mandible is intact and located. The patient is edentulous. Multilevel degenerative changes are again noted in the upper cervical spine. No acute fracture is present. Orbits: Bilateral lens replacements are noted. Globes and orbits are otherwise unremarkable. Sinuses: The paranasal sinuses and mastoid air cells are clear. Soft tissues: Supraorbital soft tissue swelling is present bilaterally. No underlying fracture is present. No foreign body is present. Limited intracranial: Moderate generalized atrophy and white matter disease is stable. IMPRESSION: 1. Supraorbital soft tissue swelling bilaterally without underlying fracture. 2. No other acute trauma to the face. 3. Stable atrophy and white matter disease. 4. Multilevel degenerative changes in the upper cervical spine. Electronically Signed   By: San Morelle M.D.   On: 10/30/2021 20:48   CT Cervical Spine Wo Contrast  Result Date: 10/30/2021 CLINICAL DATA:  Trauma, fall EXAM: CT CERVICAL SPINE WITHOUT CONTRAST TECHNIQUE: Multidetector CT imaging of the cervical spine was performed without intravenous contrast. Multiplanar CT image reconstructions were also generated. RADIATION DOSE REDUCTION: This exam was performed according to the departmental dose-optimization program which includes automated exposure control, adjustment of the mA and/or kV according to patient size and/or use of iterative reconstruction technique. COMPARISON:  None Available. FINDINGS: Alignment: Alignment of the posterior margin of vertebral bodies is unremarkable. Skull base and vertebrae: No recent fracture is seen.  Degenerative changes are noted. Soft tissues and spinal canal: There is extrinsic pressure over the ventral margin of thecal sac caused by posterior bony spurs and bulging of the annulus with mild-to-moderate spinal stenosis, more so at C4-C5 and C5-C6 levels. Disc levels: There is encroachment of neural foramina from C2 to C7 levels. Upper chest: Unremarkable. Other: Unremarkable. IMPRESSION: No recent fracture is seen in cervical spine. Cervical spondylosis with encroachment of neural foramina from C2-C7 levels. Posterior bony spurs are causing extrinsic pressure over the ventral margin of thecal sac with spinal stenosis, more so at C4-C5 and C5-C6 levels. Electronically Signed   By: Elmer Picker M.D.   On: 10/30/2021 20:47   CT HEAD WO CONTRAST (5MM)  Result Date: 10/30/2021 CLINICAL DATA:  Trauma, fall, altered mental status EXAM: CT HEAD WITHOUT CONTRAST TECHNIQUE: Contiguous axial images were obtained from the base of the skull through the vertex without intravenous contrast. RADIATION DOSE REDUCTION: This exam was performed according to the departmental dose-optimization program which includes automated exposure control, adjustment of the mA and/or kV according to patient size and/or use of iterative reconstruction technique. COMPARISON:  09/19/2021 FINDINGS: Brain: No acute intracranial findings are seen in noncontrast CT brain. There are no signs of bleeding within the cranium. Cortical sulci are prominent. There is decreased density in periventricular and subcortical white matter. There is encephalomalacia in the posterior left cerebellum with no significant interval change. Vascular: Scattered arterial calcifications are seen. Skull: No fracture is seen in the calvarium. Sinuses/Orbits: There are no air-fluid levels in paranasal sinuses. There is decrease in number of air cells and fluid attenuation in mastoid air cells on both sides with no significant interval change. Other: No significant  interval changes are noted. IMPRESSION: No acute intracranial findings are seen in noncontrast CT brain. Atrophy. Small vessel disease. 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 Electronically Signed   By: Elmer Picker M.D.   On: 10/30/2021 20:44    Microbiology: Results for orders placed or performed during the hospital encounter of 10/30/21  Urine Culture  Status: None   Collection Time: 10/31/21 12:03 AM   Specimen: Urine, Random  Result Value Ref Range Status   Specimen Description   Final    URINE, RANDOM Performed at [name], [address] [place], [place] [address]    Special Requests   Final    NONE Performed at [facility], [address], [place], [place] [address]    Culture   Final    NO GROWTH Performed at [facility], [place] [address] [place], [place] [address]    Report Status 11/01/2021 FINAL  Final    Labs: CBC: Recent Labs  Lab 10/30/21 2018 11/02/21 0328  WBC 7.5 6.7  HGB 12.8* 12.3*  HCT 40.0 37.8*  MCV 91.7 90.2  PLT 258 269   Basic Metabolic Panel: Recent Labs  Lab 10/30/21 2018 10/31/21 0003 11/01/21 0523 11/02/21 0328  NA 137  --  136 138  K 4.2  --  3.5 3.7  CL 107  --  99 100  CO2 25  --  29 33*  GLUCOSE 148*  --  234* 187*  BUN 21  --  28* 33*  CREATININE 1.65*  --  1.68* 1.86*  CALCIUM 10.1  --  9.9 9.8  MG  --  1.9  --  2.0   Liver Function Tests: Recent Labs  Lab 10/30/21 2049  AST 16  ALT 15  ALKPHOS 54  BILITOT 0.9  PROT 7.0  ALBUMIN 3.3*   CBG: Recent Labs  Lab 11/03/21 1158 11/03/21 1627 11/03/21 1737 11/03/21 2048 11/04/21 0802  GLUCAP 192* 133* 134* 156* 152*    Discharge time spent: greater than 30 minutes.  Signed: Fritzi Mandes, MD [facility] 11/04/2021

## 2021-11-04 NOTE — Progress Notes (Signed)
Physical Therapy Treatment Patient Details Name: Justin Griffith MRN: [id number] DOB: [birth year] Today's Date: 11/04/2021   History of Present Illness Justin Griffith is an 9yoM who comes [facility] on 2/24 c substernal CP c raditation to Left shoulder, inittial troponin >27,000. Pt admitted for NSTEMI. PMH: CAD, NSTEMI, HTN, HLD, DM, COPD, CVA, remote tobacco use, colonCA, PVD, tremor. Pt underwent cardiac cath on 2/25, then developed gross hematuria. Afternoon of 2/26 developed AMS, lethargy, hypotension, seens by neurology, concern for potential hospital delerium.    PT Comments    Pt seen with nursing this am to attempt pt's request to utilize [facility] instead of bed pan. Pt assisted to edge of bed with MaxA. Once sitting pt continuously demonstrated posterior and right lateral LOB requiring ModA to correct and maintain midline. Pt also leaning forward with increased risk for fall. Pt attempted several times to stand due to determination to improve function, yet was too weak with MaxA. Pt is very motivated to return to prior level of function and will benefit from SNF placement once medically cleared.    Recommendations for follow up therapy are one component of a multi-disciplinary discharge planning process, led by the attending physician.  Recommendations may be updated based on patient status, additional functional criteria and insurance authorization.  Follow Up Recommendations  Skilled nursing-short term rehab (<3 hours/day) Can patient physically be transported by private vehicle: No   Assistance Recommended at Discharge Frequent or constant Supervision/Assistance  Patient can return home with the following Two people to help with walking and/or transfers;Two people to help with bathing/dressing/bathroom   Equipment Recommendations  Other (comment) (TBD at next facility)    Recommendations for Other Services       Precautions / Restrictions Precautions Precautions:  Fall Restrictions Weight Bearing Restrictions: No     Mobility  Bed Mobility Overal bed mobility: Needs Assistance Bed Mobility: Rolling Rolling: Max assist   Supine to sit: Max assist, HOB elevated Sit to supine: Mod assist, +2 for physical assistance   General bed mobility comments: Limited due to significant fatigue    Transfers                   General transfer comment: Attempted sit to stand several times, pt unable to raise buttocks off bed or maintain sitting balance    Ambulation/Gait                   Stairs             Wheelchair Mobility    Modified Rankin (Stroke Patients Only)       Balance Overall balance assessment: Needs assistance Sitting-balance support: Bilateral upper extremity supported, Feet supported Sitting balance-Leahy Scale: Poor Sitting balance - Comments: Unable to maintain supported sitting balance edge of bed Postural control: Posterior lean, Right lateral lean (high fall risk sitting at edge of bed)                                  Cognition Arousal/Alertness: Lethargic Behavior During Therapy: Flat affect Overall Cognitive Status: Difficult to assess Area of Impairment: Memory, Following commands, Safety/judgement, Problem solving                     Memory: Decreased short-term memory Following Commands: Follows one step commands inconsistently Safety/Judgement: Decreased awareness of safety, Decreased awareness of deficits   Problem Solving: Slow processing, Decreased initiation, Difficulty  sequencing, Requires verbal cues, Requires tactile cues General Comments: difficult to assess due to lethargy and limited alertness/participation with session        Exercises      General Comments General comments (skin integrity, edema, etc.): Pt required education on weight shifting while sitting to self correct sitting balance, yet unsuccessful.      Pertinent Vitals/Pain Pain  Assessment Pain Assessment: No/denies pain    Home Living                          Prior Function            PT Goals (current goals can now be found in the care plan section) Acute Rehab PT Goals Patient Stated Goal: ambulate to go home    Frequency    7X/week      PT Plan Current plan remains appropriate    Co-evaluation              AM-PAC PT "6 Clicks" Mobility   Outcome Measure  Help needed turning from your back to your side while in a flat bed without using bedrails?: Total Help needed moving from lying on your back to sitting on the side of a flat bed without using bedrails?: Total Help needed moving to and from a bed to a chair (including a wheelchair)?: Total Help needed standing up from a chair using your arms (e.g., wheelchair or bedside chair)?: Total Help needed to walk in hospital room?: Total Help needed climbing 3-5 steps with a railing? : Total 6 Click Score: 6    End of Session Equipment Utilized During Treatment: Gait belt Activity Tolerance: Patient limited by lethargy Patient left: in bed;with bed alarm set;with family/visitor present Nurse Communication: Mobility status PT Visit Diagnosis: Difficulty in walking, not elsewhere classified (R26.2);Other abnormalities of gait and mobility (R26.89);History of falling (Z91.81);Muscle weakness (generalized) (M62.81);Unsteadiness on feet (R26.81)     Time: 1540-0867 PT Time Calculation (min) (ACUTE ONLY): 17 min  Charges:  $Therapeutic Activity: 8-22 mins          Mikel Cella, PTA  Josie Dixon 11/04/2021, 11:34 AM

## 2021-11-04 NOTE — Discharge Instructions (Addendum)
Foley care per protocol. Pt will NEED to get his foley changed early September f/u appt with Urology

## 2021-11-13 ENCOUNTER — Ambulatory Visit: Payer: HMO | Admitting: Physician Assistant

## 2021-11-17 ENCOUNTER — Other Ambulatory Visit: Payer: Self-pay

## 2021-11-17 ENCOUNTER — Inpatient Hospital Stay
Admission: EM | Admit: 2021-11-17 | Discharge: 2021-11-24 | DRG: 698 | Disposition: A | Discharge status: Skilled Nursing Facility | Payer: HMO | Source: Skilled Nursing Facility | Attending: Internal Medicine | Admitting: Internal Medicine

## 2021-11-17 ENCOUNTER — Emergency Department: Payer: HMO

## 2021-11-17 DIAGNOSIS — I4891 Unspecified atrial fibrillation: Secondary | ICD-10-CM | POA: Diagnosis present

## 2021-11-17 DIAGNOSIS — Z800 Family history of malignant neoplasm of digestive organs: Secondary | ICD-10-CM

## 2021-11-17 DIAGNOSIS — Z7901 Long term (current) use of anticoagulants: Secondary | ICD-10-CM

## 2021-11-17 DIAGNOSIS — Z85038 Personal history of other malignant neoplasm of large intestine: Secondary | ICD-10-CM | POA: Diagnosis not present

## 2021-11-17 DIAGNOSIS — R9431 Abnormal electrocardiogram [ECG] [EKG]: Secondary | ICD-10-CM | POA: Diagnosis not present

## 2021-11-17 DIAGNOSIS — L89150 Pressure ulcer of sacral region, unstageable: Secondary | ICD-10-CM | POA: Diagnosis present

## 2021-11-17 DIAGNOSIS — T83518A Infection and inflammatory reaction due to other urinary catheter, initial encounter: Secondary | ICD-10-CM | POA: Diagnosis not present

## 2021-11-17 DIAGNOSIS — Z794 Long term (current) use of insulin: Secondary | ICD-10-CM

## 2021-11-17 DIAGNOSIS — Z955 Presence of coronary angioplasty implant and graft: Secondary | ICD-10-CM | POA: Diagnosis not present

## 2021-11-17 DIAGNOSIS — Y846 Urinary catheterization as the cause of abnormal reaction of the patient, or of later complication, without mention of misadventure at the time of the procedure: Secondary | ICD-10-CM | POA: Diagnosis present

## 2021-11-17 DIAGNOSIS — E669 Obesity, unspecified: Secondary | ICD-10-CM

## 2021-11-17 DIAGNOSIS — J181 Lobar pneumonia, unspecified organism: Secondary | ICD-10-CM | POA: Diagnosis not present

## 2021-11-17 DIAGNOSIS — D631 Anemia in chronic kidney disease: Secondary | ICD-10-CM | POA: Diagnosis present

## 2021-11-17 DIAGNOSIS — R6521 Severe sepsis with septic shock: Secondary | ICD-10-CM | POA: Diagnosis present

## 2021-11-17 DIAGNOSIS — Z515 Encounter for palliative care: Secondary | ICD-10-CM | POA: Diagnosis not present

## 2021-11-17 DIAGNOSIS — R531 Weakness: Secondary | ICD-10-CM | POA: Diagnosis not present

## 2021-11-17 DIAGNOSIS — Z466 Encounter for fitting and adjustment of urinary device: Secondary | ICD-10-CM | POA: Diagnosis not present

## 2021-11-17 DIAGNOSIS — J9811 Atelectasis: Secondary | ICD-10-CM | POA: Diagnosis not present

## 2021-11-17 DIAGNOSIS — H9190 Unspecified hearing loss, unspecified ear: Secondary | ICD-10-CM | POA: Diagnosis not present

## 2021-11-17 DIAGNOSIS — Z86711 Personal history of pulmonary embolism: Secondary | ICD-10-CM

## 2021-11-17 DIAGNOSIS — E1122 Type 2 diabetes mellitus with diabetic chronic kidney disease: Secondary | ICD-10-CM

## 2021-11-17 DIAGNOSIS — I5032 Chronic diastolic (congestive) heart failure: Secondary | ICD-10-CM | POA: Diagnosis present

## 2021-11-17 DIAGNOSIS — A419 Sepsis, unspecified organism: Secondary | ICD-10-CM | POA: Diagnosis not present

## 2021-11-17 DIAGNOSIS — I10 Essential (primary) hypertension: Secondary | ICD-10-CM | POA: Diagnosis not present

## 2021-11-17 DIAGNOSIS — G4733 Obstructive sleep apnea (adult) (pediatric): Secondary | ICD-10-CM | POA: Diagnosis not present

## 2021-11-17 DIAGNOSIS — E559 Vitamin D deficiency, unspecified: Secondary | ICD-10-CM | POA: Diagnosis not present

## 2021-11-17 DIAGNOSIS — N189 Chronic kidney disease, unspecified: Secondary | ICD-10-CM

## 2021-11-17 DIAGNOSIS — N390 Urinary tract infection, site not specified: Secondary | ICD-10-CM | POA: Diagnosis present

## 2021-11-17 DIAGNOSIS — M6281 Muscle weakness (generalized): Secondary | ICD-10-CM | POA: Diagnosis not present

## 2021-11-17 DIAGNOSIS — E875 Hyperkalemia: Secondary | ICD-10-CM

## 2021-11-17 DIAGNOSIS — Z833 Family history of diabetes mellitus: Secondary | ICD-10-CM | POA: Diagnosis not present

## 2021-11-17 DIAGNOSIS — N400 Enlarged prostate without lower urinary tract symptoms: Secondary | ICD-10-CM | POA: Diagnosis present

## 2021-11-17 DIAGNOSIS — Z7951 Long term (current) use of inhaled steroids: Secondary | ICD-10-CM

## 2021-11-17 DIAGNOSIS — A4181 Sepsis due to Enterococcus: Secondary | ICD-10-CM | POA: Diagnosis not present

## 2021-11-17 DIAGNOSIS — E1151 Type 2 diabetes mellitus with diabetic peripheral angiopathy without gangrene: Secondary | ICD-10-CM | POA: Diagnosis not present

## 2021-11-17 DIAGNOSIS — E538 Deficiency of other specified B group vitamins: Secondary | ICD-10-CM | POA: Diagnosis not present

## 2021-11-17 DIAGNOSIS — Z4682 Encounter for fitting and adjustment of non-vascular catheter: Secondary | ICD-10-CM | POA: Diagnosis not present

## 2021-11-17 DIAGNOSIS — N1832 Chronic kidney disease, stage 3b: Secondary | ICD-10-CM | POA: Diagnosis present

## 2021-11-17 DIAGNOSIS — Z87891 Personal history of nicotine dependence: Secondary | ICD-10-CM | POA: Diagnosis not present

## 2021-11-17 DIAGNOSIS — I130 Hypertensive heart and chronic kidney disease with heart failure and stage 1 through stage 4 chronic kidney disease, or unspecified chronic kidney disease: Secondary | ICD-10-CM | POA: Diagnosis not present

## 2021-11-17 DIAGNOSIS — E871 Hypo-osmolality and hyponatremia: Secondary | ICD-10-CM

## 2021-11-17 DIAGNOSIS — R4182 Altered mental status, unspecified: Secondary | ICD-10-CM | POA: Diagnosis not present

## 2021-11-17 DIAGNOSIS — B952 Enterococcus as the cause of diseases classified elsewhere: Secondary | ICD-10-CM | POA: Diagnosis not present

## 2021-11-17 DIAGNOSIS — Z66 Do not resuscitate: Secondary | ICD-10-CM | POA: Diagnosis not present

## 2021-11-17 DIAGNOSIS — N179 Acute kidney failure, unspecified: Secondary | ICD-10-CM

## 2021-11-17 DIAGNOSIS — K219 Gastro-esophageal reflux disease without esophagitis: Secondary | ICD-10-CM | POA: Diagnosis not present

## 2021-11-17 DIAGNOSIS — N401 Enlarged prostate with lower urinary tract symptoms: Secondary | ICD-10-CM | POA: Diagnosis not present

## 2021-11-17 DIAGNOSIS — J439 Emphysema, unspecified: Secondary | ICD-10-CM | POA: Diagnosis not present

## 2021-11-17 DIAGNOSIS — Z7984 Long term (current) use of oral hypoglycemic drugs: Secondary | ICD-10-CM

## 2021-11-17 DIAGNOSIS — Z951 Presence of aortocoronary bypass graft: Secondary | ICD-10-CM

## 2021-11-17 DIAGNOSIS — E7800 Pure hypercholesterolemia, unspecified: Secondary | ICD-10-CM | POA: Diagnosis not present

## 2021-11-17 DIAGNOSIS — J90 Pleural effusion, not elsewhere classified: Secondary | ICD-10-CM | POA: Diagnosis not present

## 2021-11-17 DIAGNOSIS — R1030 Lower abdominal pain, unspecified: Secondary | ICD-10-CM

## 2021-11-17 DIAGNOSIS — Z9049 Acquired absence of other specified parts of digestive tract: Secondary | ICD-10-CM

## 2021-11-17 DIAGNOSIS — E872 Acidosis, unspecified: Secondary | ICD-10-CM | POA: Diagnosis not present

## 2021-11-17 DIAGNOSIS — I2510 Atherosclerotic heart disease of native coronary artery without angina pectoris: Secondary | ICD-10-CM | POA: Diagnosis not present

## 2021-11-17 DIAGNOSIS — E785 Hyperlipidemia, unspecified: Secondary | ICD-10-CM

## 2021-11-17 DIAGNOSIS — Z7189 Other specified counseling: Secondary | ICD-10-CM | POA: Diagnosis not present

## 2021-11-17 DIAGNOSIS — R188 Other ascites: Secondary | ICD-10-CM | POA: Diagnosis not present

## 2021-11-17 DIAGNOSIS — I69351 Hemiplegia and hemiparesis following cerebral infarction affecting right dominant side: Secondary | ICD-10-CM | POA: Diagnosis not present

## 2021-11-17 DIAGNOSIS — Z8673 Personal history of transient ischemic attack (TIA), and cerebral infarction without residual deficits: Secondary | ICD-10-CM

## 2021-11-17 DIAGNOSIS — J449 Chronic obstructive pulmonary disease, unspecified: Secondary | ICD-10-CM | POA: Diagnosis not present

## 2021-11-17 DIAGNOSIS — Z20822 Contact with and (suspected) exposure to covid-19: Secondary | ICD-10-CM | POA: Diagnosis not present

## 2021-11-17 DIAGNOSIS — K6389 Other specified diseases of intestine: Secondary | ICD-10-CM | POA: Diagnosis not present

## 2021-11-17 DIAGNOSIS — Z79899 Other long term (current) drug therapy: Secondary | ICD-10-CM

## 2021-11-17 DIAGNOSIS — I252 Old myocardial infarction: Secondary | ICD-10-CM | POA: Diagnosis not present

## 2021-11-17 DIAGNOSIS — I517 Cardiomegaly: Secondary | ICD-10-CM | POA: Diagnosis not present

## 2021-11-17 DIAGNOSIS — Z6833 Body mass index (BMI) 33.0-33.9, adult: Secondary | ICD-10-CM

## 2021-11-17 LAB — COMPREHENSIVE METABOLIC PANEL
ALT: 13 U/L (ref 0–44)
AST: 26 U/L (ref 15–41)
Albumin: 3 g/dL — ABNORMAL LOW (ref 3.5–5.0)
Alkaline Phosphatase: 39 U/L (ref 38–126)
Anion gap: 11 (ref 5–15)
BUN: 106 mg/dL — ABNORMAL HIGH (ref 8–23)
CO2: 20 mmol/L — ABNORMAL LOW (ref 22–32)
Calcium: 9.7 mg/dL (ref 8.9–10.3)
Chloride: 103 mmol/L (ref 98–111)
Creatinine, Ser: 6.39 mg/dL — ABNORMAL HIGH (ref 0.61–1.24)
GFR, Estimated: 8 mL/min — ABNORMAL LOW (ref >60)
Glucose, Bld: 123 mg/dL — ABNORMAL HIGH (ref 70–99)
Potassium: 6.9 mmol/L (ref 3.5–5.1)
Sodium: 134 mmol/L — ABNORMAL LOW (ref 135–145)
Total Bilirubin: 0.5 mg/dL (ref 0.3–1.2)
Total Protein: 6.9 g/dL (ref 6.5–8.1)

## 2021-11-17 LAB — CBC WITH DIFFERENTIAL/PLATELET
Abs Immature Granulocytes: 0.13 10*3/uL — ABNORMAL HIGH (ref 0.00–0.07)
Basophils Absolute: 0.1 10*3/uL (ref 0.0–0.1)
Basophils Relative: 0 %
Eosinophils Absolute: 0 10*3/uL (ref 0.0–0.5)
Eosinophils Relative: 0 %
HCT: 39.9 % (ref 39.0–52.0)
Hemoglobin: 12.5 g/dL — ABNORMAL LOW (ref 13.0–17.0)
Immature Granulocytes: 1 %
Lymphocytes Relative: 14 %
Lymphs Abs: 1.9 10*3/uL (ref 0.7–4.0)
MCH: 29.3 pg (ref 26.0–34.0)
MCHC: 31.3 g/dL (ref 30.0–36.0)
MCV: 93.7 fL (ref 80.0–100.0)
Monocytes Absolute: 1.1 10*3/uL — ABNORMAL HIGH (ref 0.1–1.0)
Monocytes Relative: 8 %
Neutro Abs: 11 10*3/uL — ABNORMAL HIGH (ref 1.7–7.7)
Neutrophils Relative %: 77 %
Platelets: 280 10*3/uL (ref 150–400)
RBC: 4.26 MIL/uL (ref 4.22–5.81)
RDW: 13.5 % (ref 11.5–15.5)
WBC: 14.2 10*3/uL — ABNORMAL HIGH (ref 4.0–10.5)
nRBC: 0 % (ref 0.0–0.2)

## 2021-11-17 LAB — URINALYSIS, COMPLETE (UACMP) WITH MICROSCOPIC
Bilirubin Urine: NEGATIVE
Glucose, UA: 50 mg/dL — AB
Ketones, ur: NEGATIVE mg/dL
Nitrite: NEGATIVE
Protein, ur: >=300 mg/dL — AB
RBC / HPF: >50 RBC/hpf — ABNORMAL HIGH (ref 0–5)
Specific Gravity, Urine: 1.024 (ref 1.005–1.030)
Squamous Epithelial / HPF: NONE SEEN (ref 0–5)
WBC, UA: >50 WBC/hpf — ABNORMAL HIGH (ref 0–5)
pH: 5 (ref 5.0–8.0)

## 2021-11-17 LAB — BLOOD GAS, VENOUS
Acid-base deficit: 6.5 mmol/L — ABNORMAL HIGH (ref 0.0–2.0)
Bicarbonate: 20.6 mmol/L (ref 20.0–28.0)
O2 Saturation: 72.8 %
Patient temperature: 37
pCO2, Ven: 46 mmHg (ref 44–60)
pH, Ven: 7.26 (ref 7.25–7.43)
pO2, Ven: 44 mmHg (ref 32–45)

## 2021-11-17 LAB — CBG MONITORING, ED: Glucose-Capillary: 122 mg/dL — ABNORMAL HIGH (ref 70–99)

## 2021-11-17 LAB — PROTIME-INR
INR: 2.9 — ABNORMAL HIGH (ref 0.8–1.2)
Prothrombin Time: 30.3 seconds — ABNORMAL HIGH (ref 11.4–15.2)

## 2021-11-17 LAB — TROPONIN I (HIGH SENSITIVITY): Troponin I (High Sensitivity): 24 ng/L — ABNORMAL HIGH (ref <18)

## 2021-11-17 LAB — APTT: aPTT: 44 seconds — ABNORMAL HIGH (ref 24–36)

## 2021-11-17 LAB — LACTIC ACID, PLASMA: Lactic Acid, Venous: 2 mmol/L (ref 0.5–1.9)

## 2021-11-17 MED ORDER — CALCIUM GLUCONATE-NACL 1-0.675 GM/50ML-% IV SOLN
1.0000 g | Freq: Once | INTRAVENOUS | Status: AC
  Administered 2021-11-17: 1000 mg via INTRAVENOUS
  Filled 2021-11-17: qty 50

## 2021-11-17 MED ORDER — SODIUM ZIRCONIUM CYCLOSILICATE 5 G PO PACK
10.0000 g | PACK | Freq: Once | ORAL | Status: DC
Start: 2021-11-18 — End: 2021-11-18
  Filled 2021-11-17: qty 1

## 2021-11-17 MED ORDER — VANCOMYCIN HCL IN DEXTROSE 1-5 GM/200ML-% IV SOLN
1000.0000 mg | Freq: Once | INTRAVENOUS | Status: AC
  Administered 2021-11-17: 1000 mg via INTRAVENOUS
  Filled 2021-11-17: qty 200

## 2021-11-17 MED ORDER — FUROSEMIDE 10 MG/ML IJ SOLN
40.0000 mg | Freq: Once | INTRAMUSCULAR | Status: AC
  Administered 2021-11-17: 40 mg via INTRAVENOUS
  Filled 2021-11-17: qty 4

## 2021-11-17 MED ORDER — VANCOMYCIN HCL 1500 MG/300ML IV SOLN
1500.0000 mg | Freq: Once | INTRAVENOUS | Status: AC
Start: 2021-11-17 — End: 2021-11-18
  Administered 2021-11-17: 1500 mg via INTRAVENOUS
  Filled 2021-11-17: qty 300

## 2021-11-17 MED ORDER — LACTATED RINGERS IV BOLUS
1000.0000 mL | Freq: Once | INTRAVENOUS | Status: AC
  Administered 2021-11-18: 1000 mL via INTRAVENOUS

## 2021-11-17 MED ORDER — DEXTROSE 50 % IV SOLN
1.0000 | Freq: Once | INTRAVENOUS | Status: AC
  Administered 2021-11-17: 50 mL via INTRAVENOUS
  Filled 2021-11-17: qty 50

## 2021-11-17 MED ORDER — LACTATED RINGERS IV BOLUS
1000.0000 mL | Freq: Once | INTRAVENOUS | Status: AC
  Administered 2021-11-17: 1000 mL via INTRAVENOUS

## 2021-11-17 MED ORDER — LACTATED RINGERS IV BOLUS (SEPSIS)
1000.0000 mL | Freq: Once | INTRAVENOUS | Status: AC
  Administered 2021-11-17: 1000 mL via INTRAVENOUS

## 2021-11-17 MED ORDER — NALOXONE HCL 2 MG/2ML IJ SOSY
0.4000 mg | PREFILLED_SYRINGE | Freq: Once | INTRAMUSCULAR | Status: AC
  Administered 2021-11-17: 0.4 mg via INTRAVENOUS
  Filled 2021-11-17: qty 2

## 2021-11-17 MED ORDER — SODIUM CHLORIDE 0.9 % IV BOLUS
500.0000 mL | Freq: Once | INTRAVENOUS | Status: AC
  Administered 2021-11-18: 500 mL via INTRAVENOUS

## 2021-11-17 MED ORDER — PIPERACILLIN-TAZOBACTAM 3.375 G IVPB 30 MIN
3.3750 g | Freq: Once | INTRAVENOUS | Status: AC
  Administered 2021-11-17: 3.375 g via INTRAVENOUS
  Filled 2021-11-17: qty 50

## 2021-11-17 MED ORDER — INSULIN ASPART 100 UNIT/ML IV SOLN
5.0000 [IU] | Freq: Once | INTRAVENOUS | Status: AC
  Administered 2021-11-17: 5 [IU] via INTRAVENOUS
  Filled 2021-11-17: qty 0.05

## 2021-11-17 NOTE — ED Triage Notes (Addendum)
Arrived via EMS from [facility]. Staff found patient unresponsive, unknown LKW. EMS unable to obtain IV access x 2 attempts. CBG 177. EMS report Hx of CVA, patient takes eliquis. Patient only grimaces to painful stimulation. Spontaneous respirations even, unlabored on 4L per [place]. Foley from facility patent, draining amber urine.

## 2021-11-17 NOTE — ED Notes (Signed)
Foley catheter from facility removed.

## 2021-11-17 NOTE — ED Notes (Signed)
Patient withdraws from pain in bilateral upper extremities and left foot. Does not withdraw from pain to right foot but grimaces in pain. Able to state first and last name but does not open eyes or follow commands or answer additional questions.

## 2021-11-17 NOTE — H&P (Signed)
[place]   PATIENT NAME: Justin Griffith    MR#:  [id number]  DATE OF BIRTH:  [birth year]  DATE OF ADMISSION:  11/17/2021  PRIMARY CARE PHYSICIAN: [name]   Patient is coming from: SNF  REQUESTING/REFERRING PHYSICIAN: Ward, Delice Bison, D0  CHIEF COMPLAINT:   Chief Complaint  Patient presents with   unresponsive    HISTORY OF PRESENT ILLNESS:  Justin Griffith is a 81 y.o. Caucasian male with medical history significant for COPD, hypertension, dyslipidemia, PE on Eliquis, type 2 diabetes mellitus, chronic diastolic CHF with EF of 60 to 65%, stage IIIb chronic kidney disease, CVA, coronary artery disease status post CABG and BPH, who presented to the ER with acute onset of altered mental status with unresponsiveness at his skilled nursing facility while he was getting his nighttime medications.  It was unclear when he was last normal.  He was arousable to verbal stimuli or nauseous once.  Blood glucose was in the 100s.  Per EMS his pulse oximetry was within normal on his baseline 3 to 4 L of O2 by nasal cannula.  He had recently placed Foley catheter which has been functioning..  In the ER he was responding to noxious stimuli and with briefly awakens to answer questions brief stating "I feel lousy" and quickly falls asleep again.  It was difficult to arouse during my interview.  No other history could be obtained from the patient therefore.  The patient was recently admitted here from 8/10 to 11/04/2021 for acute subcortical infarct of the left frontal corona radiata and recurrent falls. ED Course: When he came to the ER BP was 98/46 with a MAP of 62 and pulse oximetry was 90% on 4 L of O2 by nasal cannula and his temperature was 98.4.  Labs revealed a potassium of 6.9 with sodium 134 and chloride 103, CO2 20 and glucose of 123 with a BUN of 106 and creatinine of 6.39 compared to 33/1.86 on 11/02/2021.  Lactic acid was 2.5 and later 1.5.  CBC showed leukocytosis of 14.2  with neutrophilia.  INR was 2.9 and PTT 30.3 with PTT of 44.  UA was strongly positive for UTI. EKG as reviewed by me : EKG showed normal sinus rhythm with a rate of 81 with prolonged PR interval and Q waves anteroseptally and inferiorly. Imaging: Portable chest x-ray showed patchy left lower lobe airspace disease worrisome for infection and right basal atelectasis.  The patient was given IV vancomycin and Zosyn, a gram of IV calcium gluconate, 40 mg of IV Lasix, 0.4 mg of IV Narcan, 5 units of IV insulin and 3 L bolus of IV lactated Ringer and 500 mL of IV normal saline.  His BP was still 81/49 with a MAP of 58.  He was started on IV Levophed which has been gradually titrated.  He will be admitted to an ICU bed for further evaluation and management. PAST MEDICAL HISTORY:   Past Medical History:  Diagnosis Date   Acute urinary retention    Arthritis    Asthma 2000   Back pain    Colon polyp 2012   COPD (chronic obstructive pulmonary disease) (HCC)    Emphysema of lung (HCC)    Heart murmur    Hernia 2000   Hyperlipidemia    Hypertension    Personal history of malignant neoplasm of large intestine    Personal history of tobacco use, presenting hazards to health    Sleep apnea  Special screening for malignant neoplasms, colon    Type 2 diabetes mellitus with proteinuria ([place]) 10/11/2014    PAST SURGICAL HISTORY:   Past Surgical History:  Procedure Laterality Date   CARDIAC CATHETERIZATION Left 09/25/2015   Procedure: Left Heart Cath and Coronary Angiography;  Surgeon: Yolonda Kida, MD;  Location: [place] [facility];  Service: Cardiovascular;  Laterality: Left;   CHOLECYSTECTOMY  1970   COLON SURGERY  07-16-99   sigmoid colon resection with primary anastomosis, chemotherapy for metastatic disease   COLONOSCOPY  2001, 2012   Dr Bary Castilla, tubular adenoma of the cecum and ascending colon in 2012.   COLONOSCOPY WITH PROPOFOL N/A 02/12/2016   Procedure: COLONOSCOPY WITH PROPOFOL;   Surgeon: Robert Bellow, MD;  Location: [facility] ENDOSCOPY;  Service: Endoscopy;  Laterality: N/A;   CORONARY STENT INTERVENTION N/A 05/17/2020   Procedure: CORONARY STENT INTERVENTION;  Surgeon: Yolonda Kida, MD;  Location: [place] [facility];  Service: Cardiovascular;  Laterality: N/A;  LAD   ESOPHAGOGASTRODUODENOSCOPY (EGD) WITH PROPOFOL N/A 02/12/2016   Procedure: ESOPHAGOGASTRODUODENOSCOPY (EGD) WITH PROPOFOL;  Surgeon: Robert Bellow, MD;  Location: [facility] ENDOSCOPY;  Service: Endoscopy;  Laterality: N/A;   HERNIA REPAIR Right    right inguinal hernia repair   JOINT REPLACEMENT Bilateral    knee replacement   KNEE SURGERY Bilateral 2010   LEFT HEART CATH AND CORONARY ANGIOGRAPHY N/A 05/17/2020   Procedure: LEFT HEART CATH AND CORONARY ANGIOGRAPHY possible PCI and stent;  Surgeon: Yolonda Kida, MD;  Location: [facility];  Service: Cardiovascular;  Laterality: N/A;   MYRINGOTOMY WITH TUBE PLACEMENT Bilateral 08/16/2014   Procedure: MYRINGOTOMY WITH TUBE PLACEMENT;  Surgeon: Carloyn Manner, MD;  Location: [facility] OR[facility];  Service: ENT;  Laterality: Bilateral;   PERIPHERAL VASCULAR CATHETERIZATION Right 09/04/2015   Procedure: Lower Extremity Angiography;  Surgeon: Katha Cabal, MD;  Location: [place] [facility];  Service: Cardiovascular;  Laterality: Right;   PERIPHERAL VASCULAR CATHETERIZATION  09/04/2015   Procedure: Lower Extremity Intervention;  Surgeon: Katha Cabal, MD;  Location: [place] [facility];  Service: Cardiovascular;;   TONSILLECTOMY     TYMPANOSTOMY TUBE PLACEMENT      SOCIAL HISTORY:   Social History   Tobacco Use   Smoking status: Former    Packs/day: 1.00    Years: 30.00    Total pack years: 30.00    Types: Cigarettes    Quit date: 03/23/1989    Years since quitting: 32.6    Passive exposure: Past   Smokeless tobacco: Never  Substance Use Topics   Alcohol use: No    Alcohol/week: 0.0 standard drinks of alcohol    FAMILY  HISTORY:   Family History  Problem Relation Age of Onset   Diabetes Father    Esophageal cancer Mother    Alzheimer's disease Paternal Uncle     DRUG ALLERGIES:   Allergies  Allergen Reactions   Farxiga [Dapagliflozin] Rash    Causes a severe rash in groin area    Dulaglutide Diarrhea and Other (See Comments)   Liraglutide Diarrhea and Other (See Comments)   Jardiance [Empagliflozin] Rash    REVIEW OF SYSTEMS:   ROS As per history of present illness. All pertinent systems were reviewed above. Constitutional, HEENT, cardiovascular, respiratory, GI, GU, musculoskeletal, neuro, psychiatric, endocrine, integumentary and hematologic systems were reviewed and are otherwise negative/unremarkable except for positive findings mentioned above in the HPI.   MEDICATIONS AT HOME:   Prior to Admission medications   Medication  Sig Start Date End Date Taking? Authorizing Provider  acetaminophen (TYLENOL) 325 MG tablet Take 650 mg by mouth every 6 (six) hours as needed.   Yes [provider]  aluminum-magnesium hydroxide-simethicone (MAALOX) 200-200-20 MG/5ML SUSP Take 30 mLs by mouth 4 (four) times daily -  before meals and at bedtime.   Yes [provider]  apixaban (ELIQUIS) 5 MG TABS tablet Take 1 tablet (5 mg total) by mouth 2 (two) times daily. 10/14/21  Yes Mecum, Erin E, PA-C  BREZTRI AEROSPHERE 160-9-4.8 MCG/ACT AERO Inhale 1 puff into the lungs daily. 08/19/21  Yes [provider]  carvedilol (COREG) 3.125 MG tablet Take 1 tablet (3.125 mg total) by mouth 2 (two) times daily with a meal. 11/04/21  Yes Fritzi Mandes, MD  cephALEXin (KEFLEX) 500 MG capsule Take 500 mg by mouth 3 (three) times daily.   Yes [provider]  cloNIDine (CATAPRES) 0.1 MG tablet Take 0.1 mg by mouth 2 (two) times daily. 12/16/20  Yes [provider]  cyanocobalamin 1000 MCG tablet Take 1 tablet (1,000 mcg total) by mouth at bedtime. 11/04/21  Yes Fritzi Mandes, MD   fenofibrate (TRICOR) 145 MG tablet Take 1 tablet (145 mg total) by mouth daily. 09/11/21  Yes Vigg, Avanti, MD  furosemide (LASIX) 40 MG tablet Take 40 mg by mouth at bedtime.   Yes [provider]  gabapentin (NEURONTIN) 600 MG tablet Take 600 mg by mouth 2 (two) times daily. 10/27/21  Yes [provider]  hydrALAZINE (APRESOLINE) 50 MG tablet TAKE ONE TABLET BY MOUTH THREE TIMES A DAY FOR BLOOD PRESSURE 12/16/20  Yes [provider]  insulin glargine-yfgn (SEMGLEE) 100 UNIT/ML Pen INJECT 10 UNITS UNDER SKIN ONCE EVERY DAY FOR DIABETES DISCARD PEN 28 DAYS AFTER OPENING. * GIVE AT THE SAME TIME DAILY * 08/04/21  Yes [provider]  lidocaine (LIDODERM) 5 % Place 1 patch onto the skin daily. Remove & Discard patch within 12 hours or as directed by MD 10/15/21  Yes [name], Jolene T, NP  losartan (COZAAR) 50 MG tablet Take 1 tablet (50 mg total) by mouth daily. 11/05/21  Yes Fritzi Mandes, MD  magnesium oxide (MAG-OX) 400 (240 Mg) MG tablet Take 1 tablet (400 mg total) by mouth daily. 06/27/21  Yes Lorella Nimrod, MD  metFORMIN (GLUCOPHAGE) 1000 MG tablet Take 1 tablet (1,000 mg total) by mouth 2 (two) times daily with a meal. 10/16/20 11/17/21 Yes Vigg, Avanti, MD  mirabegron ER (MYRBETRIQ) 50 MG TB24 tablet Take 1 tablet (50 mg total) by mouth daily. 10/06/21  Yes Vaillancourt, Aldona Bar, PA-C  nortriptyline (PAMELOR) 10 MG capsule Take 10 mg by mouth 2 (two) times daily. 09/01/21  Yes [provider]  ondansetron (ZOFRAN) 4 MG tablet Take 4 mg by mouth every 8 (eight) hours as needed for nausea or vomiting.   Yes [provider]  pantoprazole (PROTONIX) 40 MG tablet Take 40 mg by mouth daily.   Yes [provider]  rosuvastatin (CRESTOR) 40 MG tablet Take 1 tablet (40 mg total) by mouth at bedtime. 09/11/21  Yes Vigg, Avanti, MD  Tiotropium Bromide Monohydrate (SPIRIVA RESPIMAT) 2.5 MCG/ACT AERS Inhale 2 puffs into the lungs daily. 05/15/20  Yes Vigg,  Avanti, MD  Vitamin D, Cholecalciferol, 25 MCG (1000 UT) CAPS Take 2,000 mcg by mouth at bedtime.   Yes [provider]      VITAL SIGNS:  Blood pressure (!) 147/67, pulse 70, temperature (!) 96.8 F (36 C), resp. rate 17,  height _0  (1.803 m), weight 109.5 kg, SpO2 97 %.  PHYSICAL EXAMINATION:  Physical Exam  GENERAL:  81 y.o.-year-old Caucasian male patient lying in the bed with no acute distress.  He was fairly somnolent and difficult arouse EYES: Pupils equal, round, reactive to light and accommodation. No scleral icterus. Extraocular muscles intact.  HEENT: Head atraumatic, normocephalic. Oropharynx and nasopharynx clear.  NECK:  Supple, no jugular venous distention. No thyroid enlargement, no tenderness.  LUNGS: Diminished left basal breath sounds with left basal crackles.  No use of accessory muscles of respiration.  CARDIOVASCULAR: Regular rate and rhythm, S1, S2 normal. No murmurs, rubs, or gallops.  ABDOMEN: Soft, nondistended, nontender. Bowel sounds present. No organomegaly or mass.  EXTREMITIES: No pedal edema, cyanosis, or clubbing.  NEUROLOGIC: No lateralizing signs.  It was fairly somnolent and difficult to arouse. SKIN: He has stage II-III left sacral decubitus ulcer with black eschar.   LABORATORY PANEL:   CBC Recent Labs  Lab 11/18/21 0329  WBC 12.7*  HGB 10.3*  HCT 32.9*  PLT 244   ------------------------------------------------------------------------------------------------------------------  Chemistries  Recent Labs  Lab 11/17/21 2142 11/17/21 2358 11/18/21 0329  NA 134*   < > 135  K 6.9*   < > 5.9*  CL 103   < > 103  CO2 20*   < > 23  GLUCOSE 123*   < > 134*  BUN 106*   < > 109*  CREATININE 6.39*   < > 5.91*  CALCIUM 9.7   < > 9.1  MG  --   --  2.3  AST 26  --   --   ALT 13  --   --   ALKPHOS 39  --   --   BILITOT 0.5  --   --    < > = values in this interval not displayed.    ------------------------------------------------------------------------------------------------------------------  Cardiac Enzymes No results for input(s): "TROPONINI" in the last 168 hours. ------------------------------------------------------------------------------------------------------------------  RADIOLOGY:  CT Head Wo Contrast  Result Date: 11/17/2021 CLINICAL DATA:  Altered mental status EXAM: CT HEAD WITHOUT CONTRAST TECHNIQUE: Contiguous axial images were obtained from the base of the skull through the vertex without intravenous contrast. RADIATION DOSE REDUCTION: This exam was performed according to the departmental dose-optimization program which includes automated exposure control, adjustment of the mA and/or kV according to patient size and/or use of iterative reconstruction technique. COMPARISON:  MRI brain dated 10/31/2021 FINDINGS: Brain: No evidence of acute infarction, hemorrhage, hydrocephalus, extra-axial collection or mass lesion/mass effect. Subcortical white matter and periventricular small vessel ischemic changes. Old left cerebellar infarct. Vascular: No hyperdense vessel or unexpected calcification. Skull: Normal. Negative for fracture or focal lesion. Sinuses/Orbits: The visualized paranasal sinuses are essentially clear. The mastoid air cells are unopacified. Other: None. IMPRESSION: No evidence of acute intracranial abnormality. Small vessel ischemic changes. Old left cerebellar infarct. Electronically Signed   By: Julian Hy M.D.   On: 11/17/2021 22:23   DG Chest Port 1 View  Result Date: 11/17/2021 CLINICAL DATA:  Questionable sepsis. EXAM: PORTABLE CHEST 1 VIEW COMPARISON:  Chest x-ray 10/31/2021 FINDINGS: There are patchy airspace opacities in the left lung base with small left pleural effusion. There is a band of atelectasis in the right lung base. Cardiomediastinal silhouette is within normal limits. No pneumothorax or acute fracture. IMPRESSION: 1.   Patchy left lower lobe airspace disease worrisome for infection. 2.  Right basilar atelectasis. Electronically Signed   By: Ronney Asters M.D.   On: 11/17/2021 21:53  IMPRESSION AND PLAN:  Assessment and Plan: * Septic shock ([place]) - The patient will be admitted to an ICU bed. - Sepsis manifested by leukocytosis and tachypnea and severe sepsis as manifested by hypotension that persistent despite aggressive hydration and therefore he met criteria for septic shock. -Sepsis is likely secondary to UTI and left basal pneumonia and less likely to his possibly infected sacral decubitus ulcer. - We will continue on IV Levophed. - We will continue hydration with IV normal saline. - We will continue IV antibiotic therapy with IV cefepime, Zithromax and vancomycin. - Intensivist consult will be obtained. - The case was discussed with the ICU team. - We will follow blood and urine cultures.  Lobar pneumonia ([place]) - His left basal pneumonia could be certainly contributing to his septic shock. - We will continue antibiotic therapy with IV cefepime, Zithromax and vancomycin. - Mucolytic therapy will be provided. - Bronchodilator therapy will be provided.  Acute lower UTI - This is broadly covered with above-mentioned antibiotics. - We will follow urine culture.  Hyperkalemia - He has been aggressively managed for hyperkalemia and has been improving. - We will continue monitoring.  Dyslipidemia - We will continue statin therapy.  Acute kidney injury superimposed on chronic kidney disease ([place]) - This is AKI superimposed on stage IIIb chronic kidney disease. - The patient will be hydrated with IV normal saline and will follow BMP. - This is likely prerenal and exacerbated by sepsis. - We will avoid nephrotoxins. - Nephrology consult will be obtained. - I notified Dr. Holley Raring about the patient   Type 2 diabetes mellitus with chronic kidney disease, without long-term current use of insulin  ([place]) - We will place the patient on supplement coverage with NovoLog. - We will hold off Glucophage given acute kidney injury  GERD without esophagitis - We will continue PPI therapy.    DVT prophylaxis: Eliquis. Advanced Care Planning:  Code Status: full code.  Family Communication:  The plan of care was discussed in details with the patient (and family). I answered all questions. The patient agreed to proceed with the above mentioned plan. Further management will depend upon hospital course. Disposition Plan: Back to previous home environment Consults called: Nephrology, intensivist. All the records are reviewed and case discussed with ED provider.  Status is: Inpatient   At the time of the admission, it appears that the appropriate admission status for this patient is inpatient.  This is judged to be reasonable and necessary in order to provide the required intensity of service to ensure the patient's safety given the presenting symptoms, physical exam findings and initial radiographic and laboratory data in the context of comorbid conditions.  The patient requires inpatient status due to high intensity of service, high risk of further deterioration and high frequency of surveillance required.  I certify that at the time of admission, it is my clinical judgment that the patient will require inpatient hospital care extending more than 2 midnights.                            Dispo: The patient is from: Home              Anticipated d/c is to: Home              Patient currently is not medically stable to d/c.              Difficult to place patient: No  Authorized  and performed by: Eugenie Norrie, MD Total critical care time: Approximately 75     minutes. Due to a high probability of clinically significant, life-threatening deterioration, the patient required my highest level of preparedness to intervene emergently and I personally spent this critical care time directly and personally  managing the patient.  This critical care time included obtaining a history, examining the patient, pulse oximetry, ordering and review of studies, arranging urgent treatment with development of management plan, evaluation of patient's response to treatment, frequent reassessment, and discussions with other providers. This critical care time was performed to assess and manage the high probability of imminent, life-threatening deterioration that could result in multiorgan failure.  It was exclusive of separately billable procedures and treating other patients and teaching time.   Christel Mormon M.D on 11/18/2021 at 4:54 AM  [facility]   From 7 PM-7 AM, contact night-coverage [email or web address]  CC: Primary care physician; [name]

## 2021-11-17 NOTE — Consult Note (Signed)
PHARMACY -  BRIEF ANTIBIOTIC NOTE   Pharmacy has received consult(s) for vancomycin from an ED provider. Patient is also ordered Zosyn.  The patient's profile has been reviewed for ht/wt/allergies/indication/available labs.    One time order(s) placed for  --Vancomycin 1 g IV  Further antibiotics/pharmacy consults should be ordered by admitting physician if indicated.                       Thank you, Benita Gutter 11/17/2021  9:47 PM

## 2021-11-17 NOTE — ED Notes (Signed)
Insulin requested from pharmacy at this time.

## 2021-11-17 NOTE — ED Notes (Signed)
Dr. Jacelyn Grip messaged via secure chat to notify of potassium of 6.9

## 2021-11-17 NOTE — ED Provider Notes (Signed)
[facility] Provider Note    Event Date/Time   First MD Initiated Contact with Patient 11/17/21 2129     (approximate)   History   unresponsive   HPI  Justin Griffith is a 81 y.o. male   Past medical history of CVA with right-sided deficits, CAD, A-fib on Eliquis, 2 diabetes, COPD, lipidemia, hypertension, presents with altered mental status, found to be minimally responsive in bed by his nursing home facility staff while trying to administer nighttime medications tonight.  Unknown last normal.  Rousable to verbal stimuli or noxious stimuli.  Glucose 100s.  Per EMS, hemodynamics were normal and oxygen saturation was normal.  Emergency department, I am able to get him to respond to noxious stimuli, he awakens briefly to answer questions " feel lousy" quickly becomes sleepy once again.  Maintaining his airway.  Foley draining cloudy  urine.  History was obtained via EMS and a phone call with the patient's wife.      Physical Exam   Triage Vital Signs: ED Triage Vitals  Enc Vitals Group     BP      Pulse      Resp      Temp      Temp src      SpO2      Weight      Height      Head Circumference      Peak Flow      Pain Score      Pain Loc      Pain Edu?      Excl. in [place]?     Most recent vital signs: Vitals:   11/17/21 2230 11/17/21 2330  BP: (!) 96/50 (!) 101/52  Pulse: 78 82  Resp: 14 17  Temp:    SpO2: 94% 99%    General: Somnolent but arousable to loud verbal or noxious stimuli. CV:  Heart sounds normal rate Resp:  Normal effort.  Lungs distant sounding but no wheezing or rales. Abd:  No distention.  And nontender to palpation, not rigid.  Indwelling Foley catheter in place with cloudy dark urine.   ED Results / Procedures / Treatments   Labs (all labs ordered are listed, but only abnormal results are displayed) Labs Reviewed  LACTIC ACID, PLASMA - Abnormal; Notable for the following components:      Result Value   Lactic  Acid, Venous 2.0 (*)    All other components within normal limits  COMPREHENSIVE METABOLIC PANEL - Abnormal; Notable for the following components:   Sodium 134 (*)    Potassium 6.9 (*)    CO2 20 (*)    Glucose, Bld 123 (*)    BUN 106 (*)    Creatinine, Ser 6.39 (*)    Albumin 3.0 (*)    GFR, Estimated 8 (*)    All other components within normal limits  CBC WITH DIFFERENTIAL/PLATELET - Abnormal; Notable for the following components:   WBC 14.2 (*)    Hemoglobin 12.5 (*)    Neutro Abs 11.0 (*)    Monocytes Absolute 1.1 (*)    Abs Immature Granulocytes 0.13 (*)    All other components within normal limits  PROTIME-INR - Abnormal; Notable for the following components:   Prothrombin Time 30.3 (*)    INR 2.9 (*)    All other components within normal limits  APTT - Abnormal; Notable for the following components:   aPTT 44 (*)    All other components within  normal limits  URINALYSIS, COMPLETE (UACMP) WITH MICROSCOPIC - Abnormal; Notable for the following components:   Color, Urine YELLOW (*)    APPearance TURBID (*)    Glucose, UA 50 (*)    Hgb urine dipstick LARGE (*)    Protein, ur >=300 (*)    Leukocytes,Ua MODERATE (*)    RBC / HPF >50 (*)    WBC, UA >50 (*)    Bacteria, UA MANY (*)    Non Squamous Epithelial PRESENT (*)    All other components within normal limits  BLOOD GAS, VENOUS - Abnormal; Notable for the following components:   Acid-base deficit 6.5 (*)    All other components within normal limits  CBG MONITORING, ED - Abnormal; Notable for the following components:   Glucose-Capillary 122 (*)    All other components within normal limits  TROPONIN I (HIGH SENSITIVITY) - Abnormal; Notable for the following components:   Troponin I (High Sensitivity) 24 (*)    All other components within normal limits  CULTURE, BLOOD (ROUTINE X 2)  CULTURE, BLOOD (ROUTINE X 2)  URINE CULTURE  LACTIC ACID, PLASMA  BASIC METABOLIC PANEL  TROPONIN I (HIGH SENSITIVITY)     I  reviewed labs and they are notable for lactate of 2.0, markedly elevated creatinine, hyperkalemia.  EKG  ED ECG REPORT I, Lucillie Garfinkel, the attending physician, personally viewed and interpreted this ECG.   Date: 11/17/2021  EKG Time: 2130  Rate: 81  Rhythm: normal sinus rhythm  Axis: Normal  Intervals: No ischemic changes    RADIOLOGY I dependently reviewed and interpreted CT of the head and see no obvious hemorrhage or midline shift   PROCEDURES:  Critical Care performed: Yes, see critical care procedure note(s)  .Critical Care  Performed by: Lucillie Garfinkel, MD Authorized by: Lucillie Garfinkel, MD   Critical care provider statement:    Critical care time (minutes):  30   Critical care was necessary to treat or prevent imminent or life-threatening deterioration of the following conditions:  Circulatory failure, sepsis, shock, dehydration and metabolic crisis   Critical care was time spent personally by me on the following activities:  Development of treatment plan with patient or surrogate, evaluation of patient's response to treatment, examination of patient, re-evaluation of patient's condition, ordering and review of radiographic studies, pulse oximetry, ordering and review of laboratory studies, ordering and performing treatments and interventions, review of old charts and obtaining history from patient or surrogate    MEDICATIONS ORDERED IN ED: Medications  vancomycin (VANCOREADY) IVPB 1500 mg/300 mL (1,500 mg Intravenous New Bag/Given 11/17/21 2333)  sodium chloride 0.9 % bolus 500 mL (has no administration in time range)  lactated ringers bolus 1,000 mL (has no administration in time range)  sodium zirconium cyclosilicate (LOKELMA) packet 10 g (has no administration in time range)  lactated ringers bolus 1,000 mL (0 mLs Intravenous Stopped 11/17/21 2306)  piperacillin-tazobactam (ZOSYN) IVPB 3.375 g (0 g Intravenous Stopped 11/17/21 2226)  naloxone (NARCAN) injection 0.4 mg (0.4  mg Intravenous Given 11/17/21 2142)  vancomycin (VANCOCIN) IVPB 1000 mg/200 mL premix (0 mg Intravenous Stopped 11/17/21 2331)  furosemide (LASIX) injection 40 mg (40 mg Intravenous Given 11/17/21 2307)  calcium gluconate 1 g/ 50 mL sodium chloride IVPB (0 mg Intravenous Stopped 11/17/21 2306)  insulin aspart (novoLOG) injection 5 Units (5 Units Intravenous Given 11/17/21 2318)    And  dextrose 50 % solution 50 mL (50 mLs Intravenous Given 11/17/21 2318)  lactated ringers bolus 1,000 mL (1,000 mLs  Intravenous New Bag/Given 11/17/21 2331)     IMPRESSION / MDM / ASSESSMENT AND PLAN / ED COURSE  I reviewed the triage vital signs and the nursing notes.                              Differential diagnosis includes, but is not limited to, sepsis, intracranial bleeding, metabolic derangement.   The patient is on the cardiac monitor to evaluate for evidence of arrhythmia and/or significant heart rate changes.  MDM: Patient with multiple medical comorbidities is found to be in an altered state, minimally arousable, but maintaining airway at this time and gases normal.  He has hypotensive and profound renal failure, given 2 L of crystalloid with good response in his blood pressure.  Appears to be sepsis with lactic 2.0 and evidence of pneumonia on his chest x-ray.  He was given broad-spectrum antibiotics.  Urinalysis showed signs of infection, however obtained from his indwelling Foley catheter, will require change.  Hyperkalemia without EKG changes, treated and will recheck BMP.  Admission..  Patient's presentation is most consistent with acute presentation with potential threat to life or bodily function.       FINAL CLINICAL IMPRESSION(S) / ED DIAGNOSES   Final diagnoses:  None     Rx / DC Orders   ED Discharge Orders     None        Note:  This document was prepared using Dragon voice recognition software and may include unintentional dictation errors.    Lucillie Garfinkel,  MD 11/18/21 0001

## 2021-11-18 ENCOUNTER — Inpatient Hospital Stay: Payer: HMO

## 2021-11-18 DIAGNOSIS — A419 Sepsis, unspecified organism: Secondary | ICD-10-CM

## 2021-11-18 DIAGNOSIS — N179 Acute kidney failure, unspecified: Secondary | ICD-10-CM

## 2021-11-18 DIAGNOSIS — R6521 Severe sepsis with septic shock: Secondary | ICD-10-CM | POA: Diagnosis not present

## 2021-11-18 DIAGNOSIS — J181 Lobar pneumonia, unspecified organism: Secondary | ICD-10-CM

## 2021-11-18 DIAGNOSIS — N390 Urinary tract infection, site not specified: Secondary | ICD-10-CM

## 2021-11-18 DIAGNOSIS — E1122 Type 2 diabetes mellitus with diabetic chronic kidney disease: Secondary | ICD-10-CM

## 2021-11-18 DIAGNOSIS — E875 Hyperkalemia: Secondary | ICD-10-CM

## 2021-11-18 HISTORY — DX: Sepsis, unspecified organism: A41.9

## 2021-11-18 LAB — BASIC METABOLIC PANEL
Anion gap: 10 (ref 5–15)
Anion gap: 11 (ref 5–15)
Anion gap: 7 (ref 5–15)
Anion gap: 9 (ref 5–15)
BUN: 104 mg/dL — ABNORMAL HIGH (ref 8–23)
BUN: 104 mg/dL — ABNORMAL HIGH (ref 8–23)
BUN: 104 mg/dL — ABNORMAL HIGH (ref 8–23)
BUN: 109 mg/dL — ABNORMAL HIGH (ref 8–23)
CO2: 20 mmol/L — ABNORMAL LOW (ref 22–32)
CO2: 23 mmol/L (ref 22–32)
CO2: 23 mmol/L (ref 22–32)
CO2: 23 mmol/L (ref 22–32)
Calcium: 8.9 mg/dL (ref 8.9–10.3)
Calcium: 9.1 mg/dL (ref 8.9–10.3)
Calcium: 9.2 mg/dL (ref 8.9–10.3)
Calcium: 9.2 mg/dL (ref 8.9–10.3)
Chloride: 102 mmol/L (ref 98–111)
Chloride: 102 mmol/L (ref 98–111)
Chloride: 103 mmol/L (ref 98–111)
Chloride: 106 mmol/L (ref 98–111)
Creatinine, Ser: 5.6 mg/dL — ABNORMAL HIGH (ref 0.61–1.24)
Creatinine, Ser: 5.81 mg/dL — ABNORMAL HIGH (ref 0.61–1.24)
Creatinine, Ser: 5.91 mg/dL — ABNORMAL HIGH (ref 0.61–1.24)
Creatinine, Ser: 6.5 mg/dL — ABNORMAL HIGH (ref 0.61–1.24)
GFR, Estimated: 10 mL/min — ABNORMAL LOW (ref >60)
GFR, Estimated: 8 mL/min — ABNORMAL LOW (ref >60)
GFR, Estimated: 9 mL/min — ABNORMAL LOW (ref >60)
GFR, Estimated: 9 mL/min — ABNORMAL LOW (ref >60)
Glucose, Bld: 134 mg/dL — ABNORMAL HIGH (ref 70–99)
Glucose, Bld: 142 mg/dL — ABNORMAL HIGH (ref 70–99)
Glucose, Bld: 201 mg/dL — ABNORMAL HIGH (ref 70–99)
Glucose, Bld: 87 mg/dL (ref 70–99)
Potassium: 5.2 mmol/L — ABNORMAL HIGH (ref 3.5–5.1)
Potassium: 5.6 mmol/L — ABNORMAL HIGH (ref 3.5–5.1)
Potassium: 5.9 mmol/L — ABNORMAL HIGH (ref 3.5–5.1)
Potassium: 6.1 mmol/L — ABNORMAL HIGH (ref 3.5–5.1)
Sodium: 132 mmol/L — ABNORMAL LOW (ref 135–145)
Sodium: 135 mmol/L (ref 135–145)
Sodium: 136 mmol/L (ref 135–145)
Sodium: 136 mmol/L (ref 135–145)

## 2021-11-18 LAB — CBC
HCT: 32.9 % — ABNORMAL LOW (ref 39.0–52.0)
Hemoglobin: 10.3 g/dL — ABNORMAL LOW (ref 13.0–17.0)
MCH: 28.9 pg (ref 26.0–34.0)
MCHC: 31.3 g/dL (ref 30.0–36.0)
MCV: 92.4 fL (ref 80.0–100.0)
Platelets: 244 10*3/uL (ref 150–400)
RBC: 3.56 MIL/uL — ABNORMAL LOW (ref 4.22–5.81)
RDW: 13.5 % (ref 11.5–15.5)
WBC: 12.7 10*3/uL — ABNORMAL HIGH (ref 4.0–10.5)
nRBC: 0 % (ref 0.0–0.2)

## 2021-11-18 LAB — BLOOD GAS, VENOUS
Acid-base deficit: 2.9 mmol/L — ABNORMAL HIGH (ref 0.0–2.0)
Bicarbonate: 23.7 mmol/L (ref 20.0–28.0)
O2 Saturation: 80.4 %
Patient temperature: 37
pCO2, Ven: 47 mmHg (ref 44–60)
pH, Ven: 7.31 (ref 7.25–7.43)
pO2, Ven: 48 mmHg — ABNORMAL HIGH (ref 32–45)

## 2021-11-18 LAB — GLUCOSE, CAPILLARY
Glucose-Capillary: 130 mg/dL — ABNORMAL HIGH (ref 70–99)
Glucose-Capillary: 95 mg/dL (ref 70–99)
Glucose-Capillary: 81 mg/dL (ref 70–99)
Glucose-Capillary: 98 mg/dL (ref 70–99)
Glucose-Capillary: 111 mg/dL — ABNORMAL HIGH (ref 70–99)
Glucose-Capillary: 109 mg/dL — ABNORMAL HIGH (ref 70–99)

## 2021-11-18 LAB — CORTISOL-AM, BLOOD: Cortisol - AM: 20 ug/dL (ref 6.7–22.6)

## 2021-11-18 LAB — LACTIC ACID, PLASMA
Lactic Acid, Venous: 1.5 mmol/L (ref 0.5–1.9)
Lactic Acid, Venous: 2.5 mmol/L (ref 0.5–1.9)

## 2021-11-18 LAB — PROCALCITONIN: Procalcitonin: 0.84 ng/mL

## 2021-11-18 LAB — APTT: aPTT: 83 seconds — ABNORMAL HIGH (ref 24–36)

## 2021-11-18 LAB — HEPARIN LEVEL (UNFRACTIONATED)
Heparin Unfractionated: >1.1 IU/mL — ABNORMAL HIGH (ref 0.30–0.70)
Heparin Unfractionated: >1.1 IU/mL — ABNORMAL HIGH (ref 0.30–0.70)

## 2021-11-18 LAB — PROTIME-INR
INR: 2.6 — ABNORMAL HIGH (ref 0.8–1.2)
Prothrombin Time: 27.9 seconds — ABNORMAL HIGH (ref 11.4–15.2)

## 2021-11-18 LAB — TROPONIN I (HIGH SENSITIVITY): Troponin I (High Sensitivity): 22 ng/L — ABNORMAL HIGH (ref <18)

## 2021-11-18 LAB — MRSA NEXT GEN BY PCR, NASAL: MRSA by PCR Next Gen: NOT DETECTED

## 2021-11-18 LAB — MAGNESIUM: Magnesium: 2.3 mg/dL (ref 1.7–2.4)

## 2021-11-18 LAB — PHOSPHORUS: Phosphorus: 4.7 mg/dL — ABNORMAL HIGH (ref 2.5–4.6)

## 2021-11-18 MED ORDER — HEPARIN (PORCINE) 25000 UT/250ML-% IV SOLN
1200.0000 [IU]/h | INTRAVENOUS | Status: DC
  Administered 2021-11-18 – 2021-11-20 (×3): 1200 [IU]/h via INTRAVENOUS
  Filled 2021-11-18 (×3): qty 250

## 2021-11-18 MED ORDER — ADULT MULTIVITAMIN W/MINERALS CH
1.0000 | ORAL_TABLET | Freq: Every day | ORAL | Status: DC
  Administered 2021-11-19 – 2021-11-24 (×6): 1 via ORAL
  Filled 2021-11-18 (×6): qty 1

## 2021-11-18 MED ORDER — MAGNESIUM OXIDE -MG SUPPLEMENT 400 (240 MG) MG PO TABS
400.0000 mg | ORAL_TABLET | Freq: Every day | ORAL | Status: DC
  Administered 2021-11-18 – 2021-11-24 (×7): 400 mg via ORAL
  Filled 2021-11-18 (×7): qty 1

## 2021-11-18 MED ORDER — VITAMIN B-12 1000 MCG PO TABS
1000.0000 ug | ORAL_TABLET | Freq: Every day | ORAL | Status: DC
  Administered 2021-11-18 – 2021-11-23 (×6): 1000 ug via ORAL
  Filled 2021-11-18 (×6): qty 1

## 2021-11-18 MED ORDER — DEXTROSE 50 % IV SOLN
1.0000 | Freq: Once | INTRAVENOUS | Status: AC
  Administered 2021-11-18: 50 mL via INTRAVENOUS
  Filled 2021-11-18: qty 50

## 2021-11-18 MED ORDER — SODIUM POLYSTYRENE SULFONATE 15 GM/60ML PO SUSP
45.0000 g | Freq: Once | ORAL | Status: DC
Start: 2021-11-18 — End: 2021-11-18

## 2021-11-18 MED ORDER — VITAMIN D 25 MCG (1000 UNIT) PO TABS
2000.0000 [IU] | ORAL_TABLET | Freq: Every day | ORAL | Status: DC
  Administered 2021-11-18 – 2021-11-23 (×6): 2000 [IU] via ORAL
  Filled 2021-11-18 (×5): qty 2

## 2021-11-18 MED ORDER — ROSUVASTATIN CALCIUM 10 MG PO TABS: 40.0000 mg | ORAL_TABLET | Freq: Every day | ORAL | Status: DC

## 2021-11-18 MED ORDER — ACETAMINOPHEN 650 MG RE SUPP: 650.0000 mg | Freq: Four times a day (QID) | RECTAL | Status: DC | PRN

## 2021-11-18 MED ORDER — INSULIN ASPART 100 UNIT/ML IV SOLN
10.0000 [IU] | Freq: Once | INTRAVENOUS | Status: AC
  Administered 2021-11-18: 10 [IU] via INTRAVENOUS
  Filled 2021-11-18: qty 0.1

## 2021-11-18 MED ORDER — CHLORHEXIDINE GLUCONATE CLOTH 2 % EX PADS
6.0000 | MEDICATED_PAD | Freq: Every day | CUTANEOUS | Status: DC
  Administered 2021-11-18 – 2021-11-24 (×7): 6 via TOPICAL

## 2021-11-18 MED ORDER — PANTOPRAZOLE SODIUM 40 MG PO TBEC: 40.0000 mg | DELAYED_RELEASE_TABLET | Freq: Every day | ORAL | Status: DC

## 2021-11-18 MED ORDER — MIRABEGRON ER 25 MG PO TB24
25.0000 mg | ORAL_TABLET | Freq: Every day | ORAL | Status: DC
Start: 2021-11-18 — End: 2021-11-20
  Administered 2021-11-18 – 2021-11-19 (×2): 25 mg via ORAL
  Filled 2021-11-18 (×3): qty 1

## 2021-11-18 MED ORDER — INSULIN GLARGINE-YFGN 100 UNIT/ML ~~LOC~~ SOPN
10.0000 [IU] | PEN_INJECTOR | Freq: Every day | SUBCUTANEOUS | Status: DC
  Filled 2021-11-18: qty 3

## 2021-11-18 MED ORDER — MIRABEGRON ER 50 MG PO TB24
50.0000 mg | ORAL_TABLET | Freq: Every day | ORAL | Status: DC
  Filled 2021-11-18: qty 1

## 2021-11-18 MED ORDER — TRAZODONE HCL 50 MG PO TABS: 25.0000 mg | ORAL_TABLET | Freq: Every evening | ORAL | Status: DC | PRN

## 2021-11-18 MED ORDER — ONDANSETRON HCL 4 MG/2ML IJ SOLN: 4.0000 mg | Freq: Four times a day (QID) | INTRAMUSCULAR | Status: DC | PRN

## 2021-11-18 MED ORDER — PIPERACILLIN-TAZOBACTAM IN DEX 2-0.25 GM/50ML IV SOLN
2.2500 g | Freq: Three times a day (TID) | INTRAVENOUS | Status: DC
  Administered 2021-11-18 – 2021-11-20 (×6): 2.25 g via INTRAVENOUS
  Filled 2021-11-18 (×8): qty 50

## 2021-11-18 MED ORDER — DEXTROSE 50 % IV SOLN
1.0000 | Freq: Once | INTRAVENOUS | Status: AC
Start: 2021-11-18 — End: 2021-11-18
  Administered 2021-11-18: 50 mL via INTRAVENOUS
  Filled 2021-11-18: qty 50

## 2021-11-18 MED ORDER — SODIUM CHLORIDE 0.9 % IV SOLN
250.0000 mL | INTRAVENOUS | Status: DC
  Administered 2021-11-18: 250 mL via INTRAVENOUS

## 2021-11-18 MED ORDER — LIDOCAINE 5 % EX PTCH
1.0000 | MEDICATED_PATCH | CUTANEOUS | Status: DC
  Administered 2021-11-18 – 2021-11-20 (×3): 1 via TRANSDERMAL
  Filled 2021-11-18 (×4): qty 1

## 2021-11-18 MED ORDER — ACETAMINOPHEN 325 MG PO TABS
650.0000 mg | ORAL_TABLET | Freq: Four times a day (QID) | ORAL | Status: DC | PRN
  Administered 2021-11-20 – 2021-11-23 (×2): 650 mg via ORAL
  Filled 2021-11-18: qty 2

## 2021-11-18 MED ORDER — TIOTROPIUM BROMIDE MONOHYDRATE 18 MCG IN CAPS
1.0000 | ORAL_CAPSULE | Freq: Every day | RESPIRATORY_TRACT | Status: DC
Start: 2021-11-18 — End: 2021-11-18

## 2021-11-18 MED ORDER — GUAIFENESIN ER 600 MG PO TB12: 600.0000 mg | ORAL_TABLET | Freq: Two times a day (BID) | ORAL | Status: DC

## 2021-11-18 MED ORDER — GABAPENTIN 600 MG PO TABS: 600.0000 mg | ORAL_TABLET | Freq: Two times a day (BID) | ORAL | Status: DC

## 2021-11-18 MED ORDER — ALUM & MAG HYDROXIDE-SIMETH 200-200-20 MG/5ML PO SUSP
30.0000 mL | Freq: Three times a day (TID) | ORAL | Status: DC
  Administered 2021-11-18 – 2021-11-21 (×13): 30 mL via ORAL
  Filled 2021-11-18 (×13): qty 30

## 2021-11-18 MED ORDER — INSULIN ASPART 100 UNIT/ML IJ SOLN
0.0000 [IU] | INTRAMUSCULAR | Status: DC
  Administered 2021-11-18: 1 [IU] via SUBCUTANEOUS
  Administered 2021-11-19: 3 [IU] via SUBCUTANEOUS
  Administered 2021-11-19 (×2): 1 [IU] via SUBCUTANEOUS
  Administered 2021-11-19: 2 [IU] via SUBCUTANEOUS
  Administered 2021-11-20: 1 [IU] via SUBCUTANEOUS
  Filled 2021-11-18 (×7): qty 1

## 2021-11-18 MED ORDER — FUROSEMIDE 40 MG PO TABS: 40.0000 mg | ORAL_TABLET | Freq: Every day | ORAL | Status: DC

## 2021-11-18 MED ORDER — NOREPINEPHRINE 4 MG/250ML-% IV SOLN
2.0000 ug/min | INTRAVENOUS | Status: DC
  Administered 2021-11-18 – 2021-11-19 (×3): 2 ug/min via INTRAVENOUS
  Filled 2021-11-18: qty 250

## 2021-11-18 MED ORDER — ONDANSETRON HCL 4 MG PO TABS: 4.0000 mg | ORAL_TABLET | Freq: Three times a day (TID) | ORAL | Status: DC | PRN

## 2021-11-18 MED ORDER — INSULIN GLARGINE-YFGN 100 UNIT/ML ~~LOC~~ SOLN
10.0000 [IU] | Freq: Every day | SUBCUTANEOUS | Status: DC
  Filled 2021-11-18: qty 0.1

## 2021-11-18 MED ORDER — ONDANSETRON HCL 4 MG PO TABS: 4.0000 mg | ORAL_TABLET | Freq: Four times a day (QID) | ORAL | Status: DC | PRN

## 2021-11-18 MED ORDER — ROSUVASTATIN CALCIUM 10 MG PO TABS
10.0000 mg | ORAL_TABLET | Freq: Every day | ORAL | Status: DC
  Administered 2021-11-18 – 2021-11-23 (×6): 10 mg via ORAL
  Filled 2021-11-18 (×6): qty 1

## 2021-11-18 MED ORDER — SODIUM BICARBONATE 8.4 % IV SOLN
100.0000 meq | Freq: Once | INTRAVENOUS | Status: AC
Start: 2021-11-18 — End: 2021-11-18
  Administered 2021-11-18: 100 meq via INTRAVENOUS
  Filled 2021-11-18: qty 100

## 2021-11-18 MED ORDER — IPRATROPIUM-ALBUTEROL 0.5-2.5 (3) MG/3ML IN SOLN
3.0000 mL | Freq: Four times a day (QID) | RESPIRATORY_TRACT | Status: DC
  Administered 2021-11-18 – 2021-11-19 (×5): 3 mL via RESPIRATORY_TRACT
  Filled 2021-11-18 (×5): qty 3

## 2021-11-18 MED ORDER — HYDRALAZINE HCL 50 MG PO TABS: 50.0000 mg | ORAL_TABLET | Freq: Three times a day (TID) | ORAL | Status: DC

## 2021-11-18 MED ORDER — SODIUM CHLORIDE 0.9 % IV SOLN
2.0000 g | INTRAVENOUS | Status: DC
  Filled 2021-11-18: qty 12.5

## 2021-11-18 MED ORDER — PANTOPRAZOLE SODIUM 40 MG IV SOLR
40.0000 mg | INTRAVENOUS | Status: DC
  Administered 2021-11-18: 40 mg via INTRAVENOUS
  Filled 2021-11-18: qty 10

## 2021-11-18 MED ORDER — VITAMIN C 500 MG PO TABS
500.0000 mg | ORAL_TABLET | Freq: Two times a day (BID) | ORAL | Status: DC
  Administered 2021-11-19 – 2021-11-24 (×11): 500 mg via ORAL
  Filled 2021-11-18 (×11): qty 1

## 2021-11-18 MED ORDER — CARVEDILOL 3.125 MG PO TABS
3.1250 mg | ORAL_TABLET | Freq: Two times a day (BID) | ORAL | Status: DC
Start: 2021-11-18 — End: 2021-11-18

## 2021-11-18 MED ORDER — SODIUM ZIRCONIUM CYCLOSILICATE 5 G PO PACK
10.0000 g | PACK | Freq: Three times a day (TID) | ORAL | Status: AC
Start: 2021-11-18 — End: 2021-11-18
  Administered 2021-11-18 (×2): 10 g via ORAL
  Filled 2021-11-18 (×2): qty 2

## 2021-11-18 MED ORDER — FENOFIBRATE 160 MG PO TABS
160.0000 mg | ORAL_TABLET | Freq: Every day | ORAL | Status: DC
  Filled 2021-11-18: qty 1

## 2021-11-18 MED ORDER — SODIUM ZIRCONIUM CYCLOSILICATE 5 G PO PACK
10.0000 g | PACK | Freq: Two times a day (BID) | ORAL | Status: DC
  Administered 2021-11-18: 10 g via ORAL
  Filled 2021-11-18: qty 2

## 2021-11-18 MED ORDER — SODIUM CHLORIDE 0.9 % IV SOLN
500.0000 mg | INTRAVENOUS | Status: DC
  Administered 2021-11-18: 500 mg via INTRAVENOUS
  Filled 2021-11-18: qty 500

## 2021-11-18 MED ORDER — MIDODRINE HCL 5 MG PO TABS
10.0000 mg | ORAL_TABLET | Freq: Three times a day (TID) | ORAL | Status: DC
  Administered 2021-11-18 – 2021-11-20 (×6): 10 mg via ORAL
  Filled 2021-11-18 (×6): qty 2

## 2021-11-18 MED ORDER — VANCOMYCIN VARIABLE DOSE PER UNSTABLE RENAL FUNCTION (PHARMACIST DOSING): Status: DC

## 2021-11-18 MED ORDER — SODIUM CHLORIDE 0.9 % IV SOLN
2.0000 g | INTRAVENOUS | Status: DC
  Filled 2021-11-18: qty 20

## 2021-11-18 MED ORDER — CLONIDINE HCL 0.1 MG PO TABS: 0.1000 mg | ORAL_TABLET | Freq: Two times a day (BID) | ORAL | Status: DC

## 2021-11-18 MED ORDER — MAGNESIUM HYDROXIDE 400 MG/5ML PO SUSP: 30.0000 mL | Freq: Every day | ORAL | Status: DC | PRN

## 2021-11-18 MED ORDER — ENSURE ENLIVE PO LIQD
237.0000 mL | Freq: Three times a day (TID) | ORAL | Status: DC
  Administered 2021-11-18 – 2021-11-20 (×7): 237 mL via ORAL

## 2021-11-18 MED ORDER — APIXABAN 5 MG PO TABS: 5.0000 mg | ORAL_TABLET | Freq: Two times a day (BID) | ORAL | Status: DC

## 2021-11-18 MED ORDER — VITAMIN D 25 MCG (1000 UNIT) PO TABS
2000.0000 ug | ORAL_TABLET | Freq: Every day | ORAL | Status: DC
  Filled 2021-11-18: qty 1

## 2021-11-18 MED ORDER — SODIUM CHLORIDE 0.9 % IV SOLN
1.0000 g | INTRAVENOUS | Status: DC
  Filled 2021-11-18: qty 10

## 2021-11-18 NOTE — Progress Notes (Signed)
eLink Physician-Brief Progress Note Patient Name: Justin Griffith DOB: 1940-10-18 MRN: [id number]   Date of Service  11/18/2021  HPI/Events of Note  80/M with hx of CVA, CAD, HFpEF, atrial fibrillation on eliquis, brought in from SNF due to lethargy. Pt reportedly with issues with his foley catheter and so was replaced and was functioning well.   While in the ED, pt was lethargic.  Foley with cloudy urine.  Pt hypotensive with AKI crea of 6.39 from baseline of 1.86. K 6.9.  CXR patchy left lower lobe airspace disease worrisome for infection.  Right basilar atelectasis.   eICU Interventions  Sepsis with shock UTI with indwelling foley catheter Possible pneumonia AKI on CKD Hyperkalemia DM  Plan> Medical management for hypoerkalemia which has improved K now to 5.9.  Pt given cefepime and azithromycin.  Urine culture grew pseudomonas and enteroccocus.  Would recommend adding vancomycin.  Continue levophed to maintain MAP >65.  Insulin gor glucose control.  Continue apixaban.  Continue protonix.      Intervention Category Evaluation Type: New Patient Evaluation  Elsie Lincoln 11/18/2021, 4:14 AM

## 2021-11-18 NOTE — Progress Notes (Signed)
ANTICOAGULATION CONSULT NOTE - Initial Consult  Pharmacy Consult for Heparin  Indication: atrial fibrillation  Allergies  Allergen Reactions   Farxiga [Dapagliflozin] Rash    Causes a severe rash in groin area    Dulaglutide Diarrhea and Other (See Comments)   Liraglutide Diarrhea and Other (See Comments)   Jardiance [Empagliflozin] Rash    Patient Measurements: Height: '5\' 11"'$  (180.3 cm) Weight: 109.5 kg (241 lb 6.5 oz) IBW/kg (Calculated) : 75.3 Heparin Dosing Weight:  98.7 kg   Vital Signs: Temp: 96.8 F (36 C) (08/29 0400) Temp Source: Axillary (08/28 2152) BP: 147/67 (08/29 0400) Pulse Rate: 70 (08/29 0400)  Labs: Recent Labs    11/17/21 2142 11/17/21 2358 11/18/21 0329  HGB 12.5*  --  10.3*  HCT 39.9  --  32.9*  PLT 280  --  244  APTT 44*  --   --   LABPROT 30.3*  --  27.9*  INR 2.9*  --  2.6*  CREATININE 6.39* 6.50* 5.91*  TROPONINIHS 24* 22*  --     Estimated Creatinine Clearance: 12.3 mL/min (A) (by C-G formula based on SCr of 5.91 mg/dL (H)).   Medical History: Past Medical History:  Diagnosis Date   Acute urinary retention    Arthritis    Asthma 2000   Back pain    Colon polyp 2012   COPD (chronic obstructive pulmonary disease) (HCC)    Emphysema of lung (HCC)    Heart murmur    Hernia 2000   Hyperlipidemia    Hypertension    Personal history of malignant neoplasm of large intestine    Personal history of tobacco use, presenting hazards to health    Sleep apnea    Special screening for malignant neoplasms, colon    Type 2 diabetes mellitus with proteinuria ([place]) 10/11/2014    Medications:  Medications Prior to Admission  Medication Sig Dispense Refill Last Dose   acetaminophen (TYLENOL) 325 MG tablet Take 650 mg by mouth every 6 (six) hours as needed.   prn at prn   aluminum-magnesium hydroxide-simethicone (MAALOX) 790-240-97 MG/5ML SUSP Take 30 mLs by mouth 4 (four) times daily -  before meals and at bedtime.   prn at prn   apixaban  (ELIQUIS) 5 MG TABS tablet Take 1 tablet (5 mg total) by mouth 2 (two) times daily. 60 tablet 1 11/17/2021 at [place] 160-9-4.8 MCG/ACT AERO Inhale 1 puff into the lungs daily.   11/17/2021 at 0800   carvedilol (COREG) 3.125 MG tablet Take 1 tablet (3.125 mg total) by mouth 2 (two) times daily with a meal. 60 tablet 1 11/17/2021 at 1930   cephALEXin (KEFLEX) 500 MG capsule Take 500 mg by mouth 3 (three) times daily.   11/17/2021   cloNIDine (CATAPRES) 0.1 MG tablet Take 0.1 mg by mouth 2 (two) times daily.   11/17/2021 at 0800   cyanocobalamin 1000 MCG tablet Take 1 tablet (1,000 mcg total) by mouth at bedtime. 30 tablet 1 11/17/2021 at 1930   fenofibrate (TRICOR) 145 MG tablet Take 1 tablet (145 mg total) by mouth daily. 90 tablet 1 11/17/2021 at 0800   furosemide (LASIX) 40 MG tablet Take 40 mg by mouth at bedtime.   11/17/2021 at 2000   gabapentin (NEURONTIN) 600 MG tablet Take 600 mg by mouth 2 (two) times daily.   11/17/2021 at 1930   hydrALAZINE (APRESOLINE) 50 MG tablet TAKE ONE TABLET BY MOUTH THREE TIMES A DAY FOR BLOOD PRESSURE   11/17/2021  insulin glargine-yfgn (SEMGLEE) 100 UNIT/ML Pen INJECT 10 UNITS UNDER SKIN ONCE EVERY DAY FOR DIABETES DISCARD PEN 28 DAYS AFTER OPENING. * GIVE AT THE SAME TIME DAILY *   11/17/2021 at 2000   lidocaine (LIDODERM) 5 % Place 1 patch onto the skin daily. Remove & Discard patch within 12 hours or as directed by MD 30 patch 0 11/17/2021   losartan (COZAAR) 50 MG tablet Take 1 tablet (50 mg total) by mouth daily. 30 tablet 1 11/17/2021 at 0800   magnesium oxide (MAG-OX) 400 (240 Mg) MG tablet Take 1 tablet (400 mg total) by mouth daily. 90 tablet 0 11/17/2021 at 0800   metFORMIN (GLUCOPHAGE) 1000 MG tablet Take 1 tablet (1,000 mg total) by mouth 2 (two) times daily with a meal. 180 tablet 3 11/17/2021 at 1700   mirabegron ER (MYRBETRIQ) 50 MG TB24 tablet Take 1 tablet (50 mg total) by mouth daily. 28 tablet 0 11/17/2021 at 0800   nortriptyline (PAMELOR)  10 MG capsule Take 10 mg by mouth 2 (two) times daily.   11/17/2021 at 1930   ondansetron (ZOFRAN) 4 MG tablet Take 4 mg by mouth every 8 (eight) hours as needed for nausea or vomiting.   prn at prn   pantoprazole (PROTONIX) 40 MG tablet Take 40 mg by mouth daily.   11/17/2021 at 0800   rosuvastatin (CRESTOR) 40 MG tablet Take 1 tablet (40 mg total) by mouth at bedtime. 90 tablet 3 11/17/2021 at 1930   Tiotropium Bromide Monohydrate (SPIRIVA RESPIMAT) 2.5 MCG/ACT AERS Inhale 2 puffs into the lungs daily. 1 g 3 11/17/2021 at 0800   Vitamin D, Cholecalciferol, 25 MCG (1000 UT) CAPS Take 2,000 mcg by mouth at bedtime.   11/17/2021 at 2000    Assessment: Pharmacy consulted to dose heparin in this 81 year old male admitted with septic shock.  On Eliquis 5 mg PO BID at home, last dose on 8/28 @ 1930.   CrCl = 12.3 ml/min   Goal of Therapy:  Heparin level 0.3-0.7 units/ml aPTT 66 - 102 seconds Monitor platelets by anticoagulation protocol: Yes   Plan:  Heparin 1200 units/hr ordered to start ~ 12 hr after last dose of Eliquis on 8/29 @ 0700.  Will order aPTT and HL 8 hrs after start of drip on 8/29 @ 1500.  Will use aPTT to guide dosing until HL and aPTT correlate.   Ardit Danh D 11/18/2021,5:24 AM

## 2021-11-18 NOTE — Assessment & Plan Note (Addendum)
Oral PPI

## 2021-11-18 NOTE — ED Notes (Signed)
Dr. Sidney Ace messaged via secure chat to notify of repeat potassium of 6.0

## 2021-11-18 NOTE — Assessment & Plan Note (Addendum)
-   Sepsis manifested by leukocytosis and tachypnea and severe sepsis as manifested by hypotension that persistent despite aggressive hydration and therefore he met criteria for septic shock. -Sepsis is likely secondary to Enterococcus UTI. - Off Levophed from 11/19/21 -Discontinued midodrine on 11/21/2021 -Complete total 5 more days of amoxicillin.

## 2021-11-18 NOTE — Progress Notes (Signed)
Initial Nutrition Assessment  DOCUMENTATION CODES:   Obesity unspecified  INTERVENTION:   Ensure Enlive po TID, each supplement provides 350 kcal and 20 grams of protein.  Magic cup TID with meals, each supplement provides 290 kcal and 9 grams of protein  MVI po daily   Vitamin C '500mg'$  po BID  Pt at high refeed risk; recommend monitor potassium, magnesium and phosphorus labs daily until stable  If tube feeds initiated, recommend:   Osmolite 1.5'@65ml'$ /hr- Initiate at 43m/hr and increase by 125mhr q 8 hours until goal rate is reached.   ProSource TF 20 daily via tube, each supplement provides 80kcal and 20g of protein.   Free water flushes 3078m4 hours to maintain tube patency   Regimen provides 2420kcal/day, 118g/day protein and 1369m84my of free water.   Juven Fruit Punch BID via tube, each serving provides 95kcal and 2.5g of protein (amino acids glutamine and arginine)  NUTRITION DIAGNOSIS:   Inadequate oral intake related to acute illness as evidenced by meal completion < 25%.  GOAL:   Patient will meet greater than or equal to 90% of their needs  MONITOR:   PO intake, Supplement acceptance, Labs, Weight trends, Skin, I & O's  REASON FOR ASSESSMENT:   Consult Assessment of nutrition requirement/status  ASSESSMENT:   80 y55 male with h/o GERD, HLD, DM, colon cancer, HTN, OSA, PAD, BPH, COPD, tremor, Afib, CVA, CAD, PE, NSTEMI, CKD III, hernia surgery s/p repair (2000) and urinary retention with chronic foley who is admitted with PNA, sepsis, AKI and encephalopathy.  Visited pt's room today. Pt unable to provide any nutrition related history r/t AMS. Per RN report, NGT placement was attempted but was noted to be in the lung. RN reports that she was able to get pt to take his medications today with a lot of encouragement. Pt currently on full liquid diet. RD will add supplements and vitamins to help pt meet his estimated needs and to support wound healing. Pt is  at high refeed risk. If pt has inadequate intake, plan is for fluoroscopy guided NGT placement and nutrition support. Pt may require HD. Palliative care consult is pending. Per chart, pt is down 18lbs(7%) over the past two weeks; this is significant weight loss.   Medications reviewed and include: maalox, D3, insulin, Mg oxide, protonix, lokelma, heparin, levophed, zosyn   Labs reviewed: K 5.9(H), BUN 109(h), creat 5.91(H), P 4.7(H), Mg 2.3 wnl Wbc- 12.7(H), Hgb 10.3(L), Hct 32.9(L) Cbgs- 81, 95, 130 x 24 hrs  NUTRITION - FOCUSED PHYSICAL EXAM:  Flowsheet Row Most Recent Value  Orbital Region No depletion  Upper Arm Region No depletion  Thoracic and Lumbar Region No depletion  Buccal Region No depletion  Temple Region Moderate depletion  Clavicle Bone Region Mild depletion  Clavicle and Acromion Bone Region Mild depletion  Scapular Bone Region No depletion  Dorsal Hand No depletion  Patellar Region Mild depletion  Anterior Thigh Region No depletion  Posterior Calf Region No depletion  Edema (RD Assessment) Mild  Hair Reviewed  Eyes Reviewed  Mouth Reviewed  Skin Reviewed  Nails Reviewed   Diet Order:   Diet Order             Diet full liquid Room service appropriate? Yes; Fluid consistency: Thin  Diet effective now                  EDUCATION NEEDS:   No education needs have been identified at this time  Skin:  Skin Assessment:  Reviewed RN Assessment (ecchymosis, unstageable sacral pressure injury)  Last BM:  8/29- type 6  Height:   Ht Readings from Last 1 Encounters:  11/18/21 '5\' 11"'$  (1.803 m)    Weight:   Wt Readings from Last 1 Encounters:  11/18/21 109.5 kg    Ideal Body Weight:  78 kg  BMI:  Body mass index is 33.67 kg/m.  Estimated Nutritional Needs:   Kcal:  2100-2400kcal/day  Protein:  105-120g/day  Fluid:  2.0L/day  Koleen Distance MS, RD, LDN Please refer to [facility] for RD and/or RD on-call/weekend/after hours pager

## 2021-11-18 NOTE — Progress Notes (Signed)
An USGPIV (ultrasound guided PIV) has been placed for short-term vasopressor infusion. A correctly placed ivWatch must be used when administering Vasopressors. Should this treatment be needed beyond 72 hours, central line access should be obtained.  It will be the responsibility of the bedside nurse to follow best practice to prevent extravasations.   ?

## 2021-11-18 NOTE — Assessment & Plan Note (Addendum)
Treated again this morning with IV medications and Lokelma.  Nephrology consultation.  Likely from chronic kidney disease.

## 2021-11-18 NOTE — Progress Notes (Addendum)
Pharmacy Antibiotic Note  Justin Griffith is a 81 y.o. male admitted on 11/17/2021 with sepsis, UTI.  Pharmacy has been consulted for Cefepime, Vancomycin dosing.  Pt is acute on Chronic CKD, will dose by level until renal function improves.   Plan: Pt received Zosyn 3.375 gm IV X 1 on 8/28 @ 2200.   Cefepime 2 gm IV Q24H ordered to start on 8/29 @ 0500.  Vancomycin 1 gm IV X 1 given in ED on 8/28 @ 2226. Additional Vanc 1500 mg IV X 1 ordered to make total loading dose of 2500 mg.  Will draw random Vanc on 8/29 @ 2300.  Height: '5\' 11"'$  (180.3 cm) Weight: 109.5 kg (241 lb 6.5 oz) IBW/kg (Calculated) : 75.3  Temp (24hrs), Avg:96.5 F (35.8 C), Min:95.6 F (35.3 C), Max:98.4 F (36.9 C)  Recent Labs  Lab 11/17/21 2142 11/17/21 2358 11/17/21 2359 11/18/21 0329  WBC 14.2*  --   --  12.7*  CREATININE 6.39* 6.50*  --  5.91*  LATICACIDVEN 2.0*  --  2.5* 1.5    Estimated Creatinine Clearance: 12.3 mL/min (A) (by C-G formula based on SCr of 5.91 mg/dL (H)).    Allergies  Allergen Reactions   Farxiga [Dapagliflozin] Rash    Causes a severe rash in groin area    Dulaglutide Diarrhea and Other (See Comments)   Liraglutide Diarrhea and Other (See Comments)   Jardiance [Empagliflozin] Rash    Antimicrobials this admission:   >>    >>   Dose adjustments this admission:   Microbiology results:  BCx:   UCx:    Sputum:    MRSA PCR:   Thank you for allowing pharmacy to be a part of this patient's care.  Ashlin Kreps D 11/18/2021 4:28 AM

## 2021-11-18 NOTE — Consult Note (Signed)
WOC Nurse Consult Note: Reason for Consult:Unstageable pressure injury to sacrum (DTPI vs abscess), irritant contact dermatitis to scrotum, penis, inguinal areas from poor hygiene Patient assessed with wife at bedside. She had not seen the affected area in quite some time she reports. Wound type:Pressure, irritant contact dermatitis (ICD)  ICD-10 CM Codes for Irritant Dermatitis L24A2 - Due to fecal, urinary or dual incontinence L30.4  - Erythema intertrigo. Also used for abrasion of the hand, chafing of the skin, dermatitis due to sweating and friction, friction dermatitis, friction eczema, and genital/thigh intertrigo.   Pressure Injury POA: Yes Measurement:12cm x 12cm with deeply hued area on the left buttock measuring 5cm x 3cm and a deeply hued area on the right buttock measuring 3cm x 1.5cm. SKin is intact, indurated. Presentation is consistent with deep tissue pressure injury vs abscess Wound bed:As described above Drainage (amount, consistency, odor) none Periwound: intact Dressing procedure/placement/frequency: Patient is on a mattress replacement with low air loss feature as he is in ICU. I will add guidance for turning and repositioning from side to side, minimizing time in the supine position, bilateral pressure redistribution heel boots and topical care guidance using xeroform gauze (astringent, antimicrobial, nonadherent) topped with a silicone foam dressing with the "tip" oriented pointing away from the anus.   The patient's wife observed the patient's sacrum/buttocks and admitted that "it did not look very good at all."  I explained the POC to her and she is in agreement.  The assistance of the patient' Bedside RN was essential to my evaluation today and was appreciated.  [place] nursing team will not follow, but will remain available to this patient, the nursing and medical teams.  Please re-consult if needed.  Thank you for inviting [place] to participate in this patient's Plan of  Care.  Maudie Flakes, MSN, RN, CNS, [place], Serita Grammes, [employer], [employer] phone:  [phone]

## 2021-11-18 NOTE — Assessment & Plan Note (Signed)
Continue Crestor 

## 2021-11-18 NOTE — Assessment & Plan Note (Addendum)
-   This is AKI superimposed on stage IIIb chronic kidney disease.  Patient came in with creatinine of 6.39 and last creatinine down to 1.51 upon discharge.

## 2021-11-18 NOTE — Progress Notes (Signed)
   11/18/21 1400  Clinical Encounter Type  Visited With Patient and family together  Visit Type Initial  Spiritual Encounters  Spiritual Needs Prayer   After encountering visitor of patient in ICU waiting, Chaplain proceeded to visit with patient and wife in ICU providing support through prayer as they walk through initial days in ICU.

## 2021-11-18 NOTE — ED Notes (Signed)
Dr. Sidney Ace messaged via secure chat to notify of BP of 81/49 MAP 58.

## 2021-11-18 NOTE — ED Notes (Signed)
Bair hugger placed on patient at this time.

## 2021-11-18 NOTE — Progress Notes (Addendum)
ANTICOAGULATION CONSULT NOTE - Initial Consult  Pharmacy Consult for Heparin  Indication: atrial fibrillation  Allergies  Allergen Reactions   Farxiga [Dapagliflozin] Rash    Causes a severe rash in groin area    Dulaglutide Diarrhea and Other (See Comments)   Liraglutide Diarrhea and Other (See Comments)   Jardiance [Empagliflozin] Rash    Patient Measurements: Height: '5\' 11"'$  (180.3 cm) Weight: 109.5 kg (241 lb 6.5 oz) IBW/kg (Calculated) : 75.3 Heparin Dosing Weight:  98.7 kg   Vital Signs: Temp: 99.7 F (37.6 C) (08/29 1600) Temp Source: Bladder (08/29 1600) BP: 123/40 (08/29 1815) Pulse Rate: 39 (08/29 1815)  Labs: Recent Labs    11/17/21 2142 11/17/21 2358 11/18/21 0329 11/18/21 1250 11/18/21 1756  HGB 12.5*  --  10.3*  --   --   HCT 39.9  --  32.9*  --   --   PLT 280  --  244  --   --   APTT 44*  --   --   --  83*  LABPROT 30.3*  --  27.9*  --   --   INR 2.9*  --  2.6*  --   --   HEPARINUNFRC  --   --  >1.10*  --  >1.10*  CREATININE 6.39* 6.50* 5.91* 5.81*  --   TROPONINIHS 24* 22*  --   --   --      Estimated Creatinine Clearance: 12.6 mL/min (A) (by C-G formula based on SCr of 5.81 mg/dL (H)).   Medical History: Past Medical History:  Diagnosis Date   Acute urinary retention    Arthritis    Asthma 2000   Back pain    Colon polyp 2012   COPD (chronic obstructive pulmonary disease) (HCC)    Emphysema of lung (HCC)    Heart murmur    Hernia 2000   Hyperlipidemia    Hypertension    Personal history of malignant neoplasm of large intestine    Personal history of tobacco use, presenting hazards to health    Sleep apnea    Special screening for malignant neoplasms, colon    Type 2 diabetes mellitus with proteinuria ([place]) 10/11/2014    Medications:  Medications Prior to Admission  Medication Sig Dispense Refill Last Dose   acetaminophen (TYLENOL) 325 MG tablet Take 650 mg by mouth every 6 (six) hours as needed.   prn at prn    aluminum-magnesium hydroxide-simethicone (MAALOX) [phone] MG/5ML SUSP Take 30 mLs by mouth 4 (four) times daily -  before meals and at bedtime.   prn at prn   apixaban (ELIQUIS) 5 MG TABS tablet Take 1 tablet (5 mg total) by mouth 2 (two) times daily. 60 tablet 1 11/17/2021 at [place] 160-9-4.8 MCG/ACT AERO Inhale 1 puff into the lungs daily.   11/17/2021 at 0800   carvedilol (COREG) 3.125 MG tablet Take 1 tablet (3.125 mg total) by mouth 2 (two) times daily with a meal. 60 tablet 1 11/17/2021 at 1930   cephALEXin (KEFLEX) 500 MG capsule Take 500 mg by mouth 3 (three) times daily.   11/17/2021   cloNIDine (CATAPRES) 0.1 MG tablet Take 0.1 mg by mouth 2 (two) times daily.   11/17/2021 at 0800   cyanocobalamin 1000 MCG tablet Take 1 tablet (1,000 mcg total) by mouth at bedtime. 30 tablet 1 11/17/2021 at 1930   fenofibrate (TRICOR) 145 MG tablet Take 1 tablet (145 mg total) by mouth daily. 90 tablet 1 11/17/2021 at 0800  furosemide (LASIX) 40 MG tablet Take 40 mg by mouth at bedtime.   11/17/2021 at 2000   gabapentin (NEURONTIN) 600 MG tablet Take 600 mg by mouth 2 (two) times daily.   11/17/2021 at 1930   hydrALAZINE (APRESOLINE) 50 MG tablet TAKE ONE TABLET BY MOUTH THREE TIMES A DAY FOR BLOOD PRESSURE   11/17/2021   insulin glargine-yfgn (SEMGLEE) 100 UNIT/ML Pen INJECT 10 UNITS UNDER SKIN ONCE EVERY DAY FOR DIABETES DISCARD PEN 28 DAYS AFTER OPENING. * GIVE AT THE SAME TIME DAILY *   11/17/2021 at 2000   lidocaine (LIDODERM) 5 % Place 1 patch onto the skin daily. Remove & Discard patch within 12 hours or as directed by MD 30 patch 0 11/17/2021   losartan (COZAAR) 50 MG tablet Take 1 tablet (50 mg total) by mouth daily. 30 tablet 1 11/17/2021 at 0800   magnesium oxide (MAG-OX) 400 (240 Mg) MG tablet Take 1 tablet (400 mg total) by mouth daily. 90 tablet 0 11/17/2021 at 0800   metFORMIN (GLUCOPHAGE) 1000 MG tablet Take 1 tablet (1,000 mg total) by mouth 2 (two) times daily with a meal. 180  tablet 3 11/17/2021 at 1700   mirabegron ER (MYRBETRIQ) 50 MG TB24 tablet Take 1 tablet (50 mg total) by mouth daily. 28 tablet 0 11/17/2021 at 0800   nortriptyline (PAMELOR) 10 MG capsule Take 10 mg by mouth 2 (two) times daily.   11/17/2021 at 1930   ondansetron (ZOFRAN) 4 MG tablet Take 4 mg by mouth every 8 (eight) hours as needed for nausea or vomiting.   prn at prn   pantoprazole (PROTONIX) 40 MG tablet Take 40 mg by mouth daily.   11/17/2021 at 0800   rosuvastatin (CRESTOR) 40 MG tablet Take 1 tablet (40 mg total) by mouth at bedtime. 90 tablet 3 11/17/2021 at 1930   Tiotropium Bromide Monohydrate (SPIRIVA RESPIMAT) 2.5 MCG/ACT AERS Inhale 2 puffs into the lungs daily. 1 g 3 11/17/2021 at 0800   Vitamin D, Cholecalciferol, 25 MCG (1000 UT) CAPS Take 2,000 mcg by mouth at bedtime.   11/17/2021 at 2000    Assessment: Pharmacy consulted to dose heparin in this 81 year old male admitted with septic shock.  On Eliquis 5 mg PO BID at home, last dose on 8/28 @ 1930.   CrCl = 12.6 ml/min   Goal of Therapy:  Heparin level 0.3-0.7 units/ml aPTT 66 - 102 seconds Monitor platelets by anticoagulation protocol: Yes   Plan:  8/29'@1756'$ : aPTT 83 sec, HL>1.10 - aPTT therapeutic x 1 - Continue current rate of 1200 units/hr - Ordering confirmatory aPTT in 8 hours as well as HL to monitor correlation. - Will use aPTT to guide dosing until HL and aPTT correlate.  - CBC daily  Latavious Bitter A Triana Coover 11/18/2021,6:33 PM

## 2021-11-18 NOTE — Assessment & Plan Note (Addendum)
Catheter related Enterococcus UTI.  Initially on Zosyn and now on amoxicillin.  Foley catheter changed in the emergency room and again on 11/23/2021.

## 2021-11-18 NOTE — Assessment & Plan Note (Addendum)
-   Continue supplement coverage with NovoLog.  Use low-dose Semglee insulin 5 units at night.  Hemoglobin A1c elevated 8.1. - We will hold off Glucophage given acute kidney injury

## 2021-11-18 NOTE — Assessment & Plan Note (Addendum)
Pneumonia ruled out.  Repeat chest x-ray read as negative.

## 2021-11-18 NOTE — Consult Note (Signed)
NAME:  Justin Griffith, MRN:  [id number], DOB:  30-May-1940, LOS: 1 ADMISSION DATE:  11/17/2021, CONSULTATION DATE:  11/18/21 REFERRING MD:  Dr. Sidney Ace, CHIEF COMPLAINT:  Unresponsive   History of Present Illness:  81 yo M presenting to [facility] ED from SNF via EMS on 11/17/21 after being found minimally responsive in bed by the SNF staff as they tried to administer his night time medications. Per ED documentation, last known well unknown. Per EMS glucose was in the 100's, all vitals stable including SpO2 on baseline 3-4 L [place]. SNF reported an issue with the patient's chronic foley catheter earlier on 11/17/21, but that it had been replaced and since then was functioning. Of note patient recently admitted 10/30/21-11/04/21 for evaluation of multiple falls & acute CVA.  ED course: Upon arrival the patient responsive to noxious stimuli, awakening briefly to say he felt lousy before becoming sleepy again. Foley noted to be draining cloudy urine. Patient hypotensive with profound renal failure. Sepsis protocol initiated with IVF resuscitation, empiric antibiotics. Lab work significant for hyperkalemia, acute renal failure/ NAGMA, lactic acidosis & leukocytosis, UA grossly infected, ABG WNL. CT head negative, CXR concerning for potential pneumonia vs atelectasis. TRH consulted for admission. Medications given: Calcium gluconate 1 g, 3.5 L IVF bolus, insulin 5 units & D50, lasix 40 mg, narcan, 100 meq of sodium bicarb, zosyn & vancomycin Initial Vitals: 98.4, 17, 79, 83/46 & 97 % on RA Significant labs: (Labs/ Imaging personally reviewed) I, Domingo Pulse [place], AGACNP-BC, personally viewed and interpreted this ECG. EKG Interpretation: Date: 11/18/21, EKG Time: 02:38, Rate: 71, Rhythm: NSR, QRS Axis:  LAD, Intervals: 1st degree HB, ST/T Wave abnormalities: none, Narrative Interpretation: NSR with 1st degree HB Chemistry: Na+: 134, K+: 6.9, BUN/Cr.: 106/ 6.39, Serum CO2/ AG: 20/11 Hematology: WBC: 14.2, Hgb: 12.5,   Troponin: 24 > 22, Lactic: 2.0 > 2.5, COVID-19: pending VBG: 7.26/ 46/ 44/20.6  CXR 11/17/21: pathcy left lower lobe airspace disease worrisome for infection. Right basilar atelectasis. CT head wo contrast 11/17/21: no evidence of acute intracranial abnormality. Old left cerebellar infarct  PCCM consulted for due to septic shock requiring vasopressors.  Pertinent  Medical History  CVA with Right sided deficits CAD s/p CABG HFpEF CKD Stage 3b PAD A-fib on Eliquis T2DM HLD HTN COPD on chronic 3-4 L [place] OSA Asthma  Colon cancer Urinary retention with chronic foley catheter  Significant Hospital Events: Including procedures, antibiotic start and stop dates in addition to other pertinent events   11/18/21: Admit to ICU with septic shock s/t UTI & possible CAP requiring vasopressor support.  Interim History / Subjective:  Patient somnolent, RASS: -3 to -4, rousing with persistent verbal stimuli/physical stimuli but only briefly. Appears to move all extremities and is currently protecting his airway on baseline oxygen.  Objective   Blood pressure (!) 81/49, pulse (!) 59, temperature (!) 95.8 F (35.4 C), resp. rate 18, weight 106.9 kg, SpO2 97 %.        Intake/Output Summary (Last 24 hours) at 11/18/2021 0241 Last data filed at 11/18/2021 0224 Gross per 24 hour  Intake 2538.16 ml  Output 225 ml  Net 2313.16 ml   Filed Weights   11/17/21 2152  Weight: 106.9 kg    Examination: General: Adult male, critically ill, lying in bed, NAD HEENT: MM pink/moist, anicteric, atraumatic, neck supple Neuro: somnolent, RASS: -3 to -4, unable to follow commands, PERRL +3, MAE CV: s1s2 RRR, NSR with 1st degree HB on monitor, no r/+m/g Pulm: Regular, non  labored on 3 L [place], breath sounds clear-BUL & diminished-BLL GI: soft, rounded, non tender, bs x 4 GU: foley in place with clear yellow urine Skin: unstageable pressure injury on sacrum   Extremities: warm/dry, pulses + 2 R/P, no edema  noted  Resolved Hospital Problem list     Assessment & Plan:  Sepsis with septic shock due to suspected UTI, possible CAP in the setting of unstageable pressure injury Lactic: 2.0 > 2.5, Baseline PCT: pending, UA: +Hgb, +mod leuks,+protein,+>50 WBC's Initial interventions/workup included: 3.5 L of NS/LR & Zosyn & Vancomycin - Supplemental oxygen as needed, to maintain SpO2 > 90% - f/u cultures, trend lactic/ PCT - Daily CBC, monitor WBC/ fever curve - IV antibiotics: continue azithromycin, additionally broadened to cefepime & vancomycin - IVF hydration as needed: f/u lactic pending - Continue vasopressors to maintain MAP< 65: norepinephrine - outpatient antihypertensives, BB & diuretics on hold due to hypotension  Acute Kidney Injury superimposed on CKD Stage 3b suspect secondary to urosepsis, query obstructive nephropathy? NAGMA Hyperkalemia Baseline Cr: 1.5-1.8, Cr on admission: 6.39, K+: 6.9 > 6.1 Patient received 5 units of insulin & D50, ca+ gluconate 1 g & 100 meq of sodium bicarbonate. Patient unable to take [facility] so far due to AMS - place NGT STAT, give Lokelma > otherwise will consider Kayexalate - STAT BMP, Mg, Phos ordered >> anticipate additional shifting measures will be needed - Strict I/O's: alert provider if UOP < 0.5 mL/kg/hr - Daily BMP, replace electrolytes PRN - Avoid nephrotoxic agents as able, ensure adequate renal perfusion - Nephrology consulted, appreciate input  - STAT renal US  History of PE on Eliquis - continue Eliquis once NGT placed, if unable will need to switch to Lovenox for systemic coverage  COPD without exacerbation - supplemental O2 PRN to maintain SpO2 > 90% - Spiriva on hold due to AMS, duo nebs PRN - monitor ETCO2   Altered Mental Status suspect metabolic secondary to sepsis & acute renal failure CTH negative. Initial VBG unremarkable.  - hold sedating medications including outpatient regimen: gabapentin & trazodone - consider MRI if  mental status does not improve with renal function - neuro checks Q 4 - NPO - falls & aspiration precautions  Unstageable Sacral Pressure Injury - consult to [place] - turn Q 2, reposition with offloading devices - consult to dietary once able to get NGT  Type 2 Diabetes Mellitus - Monitor CBG Q 4 hours - SSI sensitive dosing - target range while in ICU: 140-180 - follow ICU hyper/hypo-glycemia protocol  Best Practice (right click and "Reselect all SmartList Selections" daily)  Diet/type: NPO w/ meds via tube DVT prophylaxis: DOAC GI prophylaxis: PPI Lines: N/A Foley:  Yes, and it is still needed Code Status:  full code Last date of multidisciplinary goals of care discussion [11/18/21- by primary service]  Labs   CBC: Recent Labs  Lab 11/17/21 2142  WBC 14.2*  NEUTROABS 11.0*  HGB 12.5*  HCT 39.9  MCV 93.7  PLT 315    Basic Metabolic Panel: Recent Labs  Lab 11/17/21 2142 11/17/21 2358  NA 134* 132*  K 6.9* 6.1*  CL 103 102  CO2 20* 20*  GLUCOSE 123* 201*  BUN 106* 104*  CREATININE 6.39* 6.50*  CALCIUM 9.7 8.9   GFR: Estimated Creatinine Clearance: 11.1 mL/min (A) (by C-G formula based on SCr of 6.5 mg/dL (H)). Recent Labs  Lab 11/17/21 2142 11/17/21 2359  WBC 14.2*  --   LATICACIDVEN 2.0* 2.5*    Liver  Function Tests: Recent Labs  Lab 11/17/21 2142  AST 26  ALT 13  ALKPHOS 39  BILITOT 0.5  PROT 6.9  ALBUMIN 3.0*   No results for input(s): "LIPASE", "AMYLASE" in the last 168 hours. No results for input(s): "AMMONIA" in the last 168 hours.  ABG    Component Value Date/Time   HCO3 20.6 11/17/2021 2142   ACIDBASEDEF 6.5 (H) 11/17/2021 2142   O2SAT 72.8 11/17/2021 2142     Coagulation Profile: Recent Labs  Lab 11/17/21 2142  INR 2.9*    Cardiac Enzymes: No results for input(s): "CKTOTAL", "CKMB", "CKMBINDEX", "TROPONINI" in the last 168 hours.  HbA1C: Hemoglobin A1C  Date/Time Value Ref Range Status  01/11/2018 12:00 AM 7.7  Final     Comment:    [facility]   HB A1C ([facility] - [facility])  Date/Time Value Ref Range Status  09/05/2021 11:05 AM 8.7 (H) 4.8 - 5.6 % Final    Comment:             Prediabetes: 5.7 - 6.4          Diabetes: >6.4          Glycemic control for adults with diabetes: <7.0   04/22/2021 02:23 PM 7.5 (H) 4.8 - 5.6 % Final    Comment:             Prediabetes: 5.7 - 6.4          Diabetes: >6.4          Glycemic control for adults with diabetes: <7.0    Hgb A1c MFr Bld  Date/Time Value Ref Range Status  10/30/2021 08:39 PM 8.1 (H) 4.8 - 5.6 % Final    Comment:    (NOTE) Pre diabetes:          5.7%-6.4%  Diabetes:              >6.4%  Glycemic control for   <7.0% adults with diabetes   06/26/2021 12:00 AM 8.9 (H) 4.8 - 5.6 % Final    Comment:    (NOTE)         Prediabetes: 5.7 - 6.4         Diabetes: >6.4         Glycemic control for adults with diabetes: <7.0     CBG: Recent Labs  Lab 11/17/21 2250  GLUCAP 122*    Review of Systems:   UTA- patient unable to participate in interview due to AMS.  Past Medical History:  He,  has a past medical history of Acute urinary retention, Arthritis, Asthma (2000), Back pain, Colon polyp (2012), COPD (chronic obstructive pulmonary disease) ([place]), Emphysema of lung ([place]), Heart murmur, Hernia (2000), Hyperlipidemia, Hypertension, Personal history of malignant neoplasm of large intestine, Personal history of tobacco use, presenting hazards to health, Sleep apnea, Special screening for malignant neoplasms, colon, and Type 2 diabetes mellitus with proteinuria ([place]) (10/11/2014).   Surgical History:   Past Surgical History:  Procedure Laterality Date   CARDIAC CATHETERIZATION Left 09/25/2015   Procedure: Left Heart Cath and Coronary Angiography;  Surgeon: Yolonda Kida, MD;  Location: [place] CV LAB;  Service: Cardiovascular;  Laterality: Left;   CHOLECYSTECTOMY  1970   COLON SURGERY  07-16-99   sigmoid colon resection  with primary anastomosis, chemotherapy for metastatic disease   COLONOSCOPY  2001, 2012   Dr Bary Castilla, tubular adenoma of the cecum and ascending colon in 2012.   COLONOSCOPY WITH PROPOFOL N/A 02/12/2016  Procedure: COLONOSCOPY WITH PROPOFOL;  Surgeon: Robert Bellow, MD;  Location: [facility] ENDOSCOPY;  Service: Endoscopy;  Laterality: N/A;   CORONARY STENT INTERVENTION N/A 05/17/2020   Procedure: CORONARY STENT INTERVENTION;  Surgeon: Yolonda Kida, MD;  Location: [place] CV LAB;  Service: Cardiovascular;  Laterality: N/A;  LAD   ESOPHAGOGASTRODUODENOSCOPY (EGD) WITH PROPOFOL N/A 02/12/2016   Procedure: ESOPHAGOGASTRODUODENOSCOPY (EGD) WITH PROPOFOL;  Surgeon: Robert Bellow, MD;  Location: [facility] ENDOSCOPY;  Service: Endoscopy;  Laterality: N/A;   HERNIA REPAIR Right    right inguinal hernia repair   JOINT REPLACEMENT Bilateral    knee replacement   KNEE SURGERY Bilateral 2010   LEFT HEART CATH AND CORONARY ANGIOGRAPHY N/A 05/17/2020   Procedure: LEFT HEART CATH AND CORONARY ANGIOGRAPHY possible PCI and stent;  Surgeon: Yolonda Kida, MD;  Location: [place] [facility] LAB;  Service: Cardiovascular;  Laterality: N/A;   MYRINGOTOMY WITH TUBE PLACEMENT Bilateral 08/16/2014   Procedure: MYRINGOTOMY WITH TUBE PLACEMENT;  Surgeon: Carloyn Manner, MD;  Location: [facility] ORS;  Service: ENT;  Laterality: Bilateral;   PERIPHERAL VASCULAR CATHETERIZATION Right 09/04/2015   Procedure: Lower Extremity Angiography;  Surgeon: Katha Cabal, MD;  Location: [place] [facility] LAB;  Service: Cardiovascular;  Laterality: Right;   PERIPHERAL VASCULAR CATHETERIZATION  09/04/2015   Procedure: Lower Extremity Intervention;  Surgeon: Katha Cabal, MD;  Location: [name] [facility];  Service: Cardiovascular;;   TONSILLECTOMY     TYMPANOSTOMY TUBE PLACEMENT       Social History:   reports that he quit smoking about 32 years ago. His smoking use included cigarettes. He has a 30.00 pack-year  smoking history. He has been exposed to tobacco smoke. He has never used smokeless tobacco. He reports that he does not drink alcohol and does not use drugs.   Family History:  His family history includes Alzheimer's disease in his paternal uncle; Diabetes in his father; Esophageal cancer in his mother.   Allergies Allergies  Allergen Reactions   Farxiga [Dapagliflozin] Rash    Causes a severe rash in groin area    Dulaglutide Diarrhea and Other (See Comments)   Liraglutide Diarrhea and Other (See Comments)   Jardiance [Empagliflozin] Rash     Home Medications  Prior to Admission medications   Medication Sig Start Date End Date Taking? Authorizing Provider  acetaminophen (TYLENOL) 325 MG tablet Take 650 mg by mouth every 6 (six) hours as needed.   Yes [provider]  aluminum-magnesium hydroxide-simethicone (MAALOX) 200-200-20 MG/5ML SUSP Take 30 mLs by mouth 4 (four) times daily -  before meals and at bedtime.   Yes [provider]  apixaban (ELIQUIS) 5 MG TABS tablet Take 1 tablet (5 mg total) by mouth 2 (two) times daily. 10/14/21  Yes Mecum, Erin E, PA-C  BREZTRI AEROSPHERE 160-9-4.8 MCG/ACT AERO Inhale 1 puff into the lungs daily. 08/19/21  Yes [provider]  carvedilol (COREG) 3.125 MG tablet Take 1 tablet (3.125 mg total) by mouth 2 (two) times daily with a meal. 11/04/21  Yes Fritzi Mandes, MD  cephALEXin (KEFLEX) 500 MG capsule Take 500 mg by mouth 3 (three) times daily.   Yes [provider]  cloNIDine (CATAPRES) 0.1 MG tablet Take 0.1 mg by mouth 2 (two) times daily. 12/16/20  Yes [provider]  cyanocobalamin 1000 MCG tablet Take 1 tablet (1,000 mcg total) by mouth at bedtime. 11/04/21  Yes Fritzi Mandes, MD  fenofibrate (TRICOR) 145 MG tablet Take 1 tablet (145 mg total) by  mouth daily. 09/11/21  Yes Vigg, Avanti, MD  furosemide (LASIX) 40 MG tablet Take 40 mg by mouth at bedtime.   Yes [provider]  gabapentin (NEURONTIN)  600 MG tablet Take 600 mg by mouth 2 (two) times daily. 10/27/21  Yes [provider]  hydrALAZINE (APRESOLINE) 50 MG tablet TAKE ONE TABLET BY MOUTH THREE TIMES A DAY FOR BLOOD PRESSURE 12/16/20  Yes [provider]  insulin glargine-yfgn (SEMGLEE) 100 UNIT/ML Pen INJECT 10 UNITS UNDER SKIN ONCE EVERY DAY FOR DIABETES DISCARD PEN 28 DAYS AFTER OPENING. * GIVE AT THE SAME TIME DAILY * 08/04/21  Yes [provider]  lidocaine (LIDODERM) 5 % Place 1 patch onto the skin daily. Remove & Discard patch within 12 hours or as directed by MD 10/15/21  Yes [name], Jolene T, NP  losartan (COZAAR) 50 MG tablet Take 1 tablet (50 mg total) by mouth daily. 11/05/21  Yes Fritzi Mandes, MD  magnesium oxide (MAG-OX) 400 (240 Mg) MG tablet Take 1 tablet (400 mg total) by mouth daily. 06/27/21  Yes Lorella Nimrod, MD  metFORMIN (GLUCOPHAGE) 1000 MG tablet Take 1 tablet (1,000 mg total) by mouth 2 (two) times daily with a meal. 10/16/20 11/17/21 Yes Vigg, Avanti, MD  mirabegron ER (MYRBETRIQ) 50 MG TB24 tablet Take 1 tablet (50 mg total) by mouth daily. 10/06/21  Yes Vaillancourt, Aldona Bar, PA-C  nortriptyline (PAMELOR) 10 MG capsule Take 10 mg by mouth 2 (two) times daily. 09/01/21  Yes [provider]  ondansetron (ZOFRAN) 4 MG tablet Take 4 mg by mouth every 8 (eight) hours as needed for nausea or vomiting.   Yes [provider]  pantoprazole (PROTONIX) 40 MG tablet Take 40 mg by mouth daily.   Yes [provider]  rosuvastatin (CRESTOR) 40 MG tablet Take 1 tablet (40 mg total) by mouth at bedtime. 09/11/21  Yes Vigg, Avanti, MD  Tiotropium Bromide Monohydrate (SPIRIVA RESPIMAT) 2.5 MCG/ACT AERS Inhale 2 puffs into the lungs daily. 05/15/20  Yes Vigg, Avanti, MD  Vitamin D, Cholecalciferol, 25 MCG (1000 UT) CAPS Take 2,000 mcg by mouth at bedtime.   Yes [provider]     Critical care time: 68 minutes       Venetia Night, AGACNP-BC Acute Care Nurse  Practitioner [facility]   [phone] / [phone] Please see Amion for pager details.

## 2021-11-18 NOTE — Progress Notes (Addendum)
Patient admitted with sepsis, arrived to [place] from the ED. Patient is alert to self, place and time. Patient denies pain.Levophed infusing at 4 mcg/min. Patient is currently on 3 L via [place]. Deep tissue injury noted on patients buttocks, cleaned, covered with sacral foam and wound consult ordered. Patient is a high fall risk, bed alarm is on. Patient instructed to not get OOB and call light placed at his right hand.

## 2021-11-18 NOTE — ED Notes (Signed)
Report given to ICU RN at this time.

## 2021-11-19 DIAGNOSIS — A419 Sepsis, unspecified organism: Secondary | ICD-10-CM | POA: Diagnosis not present

## 2021-11-19 DIAGNOSIS — E1122 Type 2 diabetes mellitus with diabetic chronic kidney disease: Secondary | ICD-10-CM

## 2021-11-19 DIAGNOSIS — N390 Urinary tract infection, site not specified: Secondary | ICD-10-CM | POA: Diagnosis not present

## 2021-11-19 DIAGNOSIS — N179 Acute kidney failure, unspecified: Secondary | ICD-10-CM | POA: Diagnosis not present

## 2021-11-19 DIAGNOSIS — E875 Hyperkalemia: Secondary | ICD-10-CM | POA: Diagnosis not present

## 2021-11-19 DIAGNOSIS — L89150 Pressure ulcer of sacral region, unstageable: Secondary | ICD-10-CM | POA: Diagnosis not present

## 2021-11-19 DIAGNOSIS — Z515 Encounter for palliative care: Secondary | ICD-10-CM | POA: Diagnosis not present

## 2021-11-19 DIAGNOSIS — E785 Hyperlipidemia, unspecified: Secondary | ICD-10-CM

## 2021-11-19 DIAGNOSIS — E669 Obesity, unspecified: Secondary | ICD-10-CM

## 2021-11-19 DIAGNOSIS — Z7189 Other specified counseling: Secondary | ICD-10-CM | POA: Diagnosis not present

## 2021-11-19 DIAGNOSIS — N1832 Chronic kidney disease, stage 3b: Secondary | ICD-10-CM

## 2021-11-19 DIAGNOSIS — K219 Gastro-esophageal reflux disease without esophagitis: Secondary | ICD-10-CM

## 2021-11-19 LAB — GLUCOSE, CAPILLARY
Glucose-Capillary: 103 mg/dL — ABNORMAL HIGH (ref 70–99)
Glucose-Capillary: 121 mg/dL — ABNORMAL HIGH (ref 70–99)
Glucose-Capillary: 155 mg/dL — ABNORMAL HIGH (ref 70–99)
Glucose-Capillary: 204 mg/dL — ABNORMAL HIGH (ref 70–99)
Glucose-Capillary: 119 mg/dL — ABNORMAL HIGH (ref 70–99)
Glucose-Capillary: 129 mg/dL — ABNORMAL HIGH (ref 70–99)
Glucose-Capillary: 125 mg/dL — ABNORMAL HIGH (ref 70–99)

## 2021-11-19 LAB — CBC WITH DIFFERENTIAL/PLATELET
Abs Immature Granulocytes: 0.07 10*3/uL (ref 0.00–0.07)
Basophils Absolute: 0 10*3/uL (ref 0.0–0.1)
Basophils Relative: 0 %
Eosinophils Absolute: 0.1 10*3/uL (ref 0.0–0.5)
Eosinophils Relative: 1 %
HCT: 31.1 % — ABNORMAL LOW (ref 39.0–52.0)
Hemoglobin: 10 g/dL — ABNORMAL LOW (ref 13.0–17.0)
Immature Granulocytes: 1 %
Lymphocytes Relative: 19 %
Lymphs Abs: 1.7 10*3/uL (ref 0.7–4.0)
MCH: 29.1 pg (ref 26.0–34.0)
MCHC: 32.2 g/dL (ref 30.0–36.0)
MCV: 90.4 fL (ref 80.0–100.0)
Monocytes Absolute: 0.9 10*3/uL (ref 0.1–1.0)
Monocytes Relative: 10 %
Neutro Abs: 6.2 10*3/uL (ref 1.7–7.7)
Neutrophils Relative %: 69 %
Platelets: 258 10*3/uL (ref 150–400)
RBC: 3.44 MIL/uL — ABNORMAL LOW (ref 4.22–5.81)
RDW: 13.4 % (ref 11.5–15.5)
WBC: 8.9 10*3/uL (ref 4.0–10.5)
nRBC: 0 % (ref 0.0–0.2)

## 2021-11-19 LAB — BASIC METABOLIC PANEL
Anion gap: 8 (ref 5–15)
BUN: 99 mg/dL — ABNORMAL HIGH (ref 8–23)
CO2: 24 mmol/L (ref 22–32)
Calcium: 9.2 mg/dL (ref 8.9–10.3)
Chloride: 106 mmol/L (ref 98–111)
Creatinine, Ser: 5.6 mg/dL — ABNORMAL HIGH (ref 0.61–1.24)
GFR, Estimated: 10 mL/min — ABNORMAL LOW (ref >60)
Glucose, Bld: 100 mg/dL — ABNORMAL HIGH (ref 70–99)
Potassium: 5.4 mmol/L — ABNORMAL HIGH (ref 3.5–5.1)
Sodium: 138 mmol/L (ref 135–145)

## 2021-11-19 LAB — MAGNESIUM: Magnesium: 2.3 mg/dL (ref 1.7–2.4)

## 2021-11-19 LAB — HEPARIN LEVEL (UNFRACTIONATED): Heparin Unfractionated: >1.1 IU/mL — ABNORMAL HIGH (ref 0.30–0.70)

## 2021-11-19 LAB — PHOSPHORUS: Phosphorus: 3.7 mg/dL (ref 2.5–4.6)

## 2021-11-19 LAB — APTT: aPTT: 68 seconds — ABNORMAL HIGH (ref 24–36)

## 2021-11-19 MED ORDER — PANTOPRAZOLE SODIUM 40 MG PO TBEC
40.0000 mg | DELAYED_RELEASE_TABLET | Freq: Every day | ORAL | Status: DC
Start: 2021-11-19 — End: 2021-11-24
  Administered 2021-11-19 – 2021-11-24 (×6): 40 mg via ORAL
  Filled 2021-11-19 (×6): qty 1

## 2021-11-19 MED ORDER — DEXTROSE 50 % IV SOLN
1.0000 | Freq: Once | INTRAVENOUS | Status: AC
  Administered 2021-11-19: 50 mL via INTRAVENOUS
  Filled 2021-11-19: qty 50

## 2021-11-19 MED ORDER — SODIUM ZIRCONIUM CYCLOSILICATE 5 G PO PACK
10.0000 g | PACK | Freq: Once | ORAL | Status: AC
  Administered 2021-11-19: 10 g via ORAL
  Filled 2021-11-19: qty 2

## 2021-11-19 MED ORDER — INSULIN ASPART 100 UNIT/ML IV SOLN
10.0000 [IU] | Freq: Once | INTRAVENOUS | Status: AC
Start: 2021-11-19 — End: 2021-11-19
  Administered 2021-11-19: 10 [IU] via INTRAVENOUS
  Filled 2021-11-19: qty 0.1

## 2021-11-19 MED ORDER — IPRATROPIUM-ALBUTEROL 0.5-2.5 (3) MG/3ML IN SOLN
3.0000 mL | Freq: Three times a day (TID) | RESPIRATORY_TRACT | Status: DC
  Administered 2021-11-19 – 2021-11-20 (×4): 3 mL via RESPIRATORY_TRACT
  Filled 2021-11-19 (×4): qty 3

## 2021-11-19 NOTE — Progress Notes (Signed)
[place] NP notified of Potassium of 5.4. Awaiting further orders.

## 2021-11-19 NOTE — Consult Note (Signed)
Consultation Note Date: 11/19/2021   Patient Name: Justin Griffith  DOB: 1940/12/21  MRN: [id number]  Age / Sex: 81 y.o., male  PCP: [facility] Referring Physician: Loletha Grayer, MD  Reason for Consultation: Establishing goals of care  HPI/Patient Profile: 81 y.o. male  with past medical history of CVA with right-sided deficits, CAD s/p CABG, CHF, CKD, PAD, A-fib, T2DM, HLD, HTN, COPD, and OSA admitted on 11/17/2021 with AMS.  Patient diagnosed with septic shock related to UTI.  He initially required vasopressors but is now off of them.  Patient also with acute kidney injury and hyperkalemia.  Baseline creatinine around 1.8 now 5-6.  Patient also with unstageable sacral pressure injury.  PMT consulted to discuss goals of care.  Clinical Assessment and Goals of Care: I have reviewed medical records including EPIC notes, labs and imaging, assessed the patient and then met with patient's wife, daughter, and sister-in-law to discuss diagnosis prognosis, GOC, EOL wishes, disposition and options.  I attempted to speak to patient first but he appears to be quite confused telling me he thinks he is in [place].  Patient is attempting to feed himself but having a lot of difficulty -sticking his hands in his soup, unable to grasp spoon consistently.  I introduced Palliative Medicine as specialized medical care for people living with serious illness. It focuses on providing relief from the symptoms and stress of a serious illness. The goal is to improve quality of life for both the patient and the family.  Of note, family shares that patient's wife has some cognitive impairment and memory loss.  She is able to dissipate in goals of care conversation but not able to independently make decisions per family report.  Wife herself states she needs assistance making decisions.  We discussed a brief life review of the patient.  Family shares patient  and wife have been married over 44 years.  There are other children as well but they are not local.  As far as functional and nutritional status family reports a decline in patient's status recently.  They tell me about his recent stroke and discharged to rehab.  They tell me he was initially starting to improve at rehab but was then declining again.  They tell me he spent most of his time at rehab asleep.  They tell me even prior to his stroke he was having a lot of falls at home.  They share of poor p.o. intake.   We discussed patient's current illness and what it means in the larger context of patient's on-going co-morbidities.  Natural disease trajectory and expectations at EOL were discussed.  We discussed patient's overall decline and concern about how he will do moving forward.  I attempted to elicit values and goals of care important to the patient.  Family shares with me a MOST form that patient previously completed independently however I expressed my concern that he did not have a good understanding of the form as he selected things like comfort measures indicating he would never want to be hospitalized but also selected that he would want CPR and a short-term feeding tube.  I shared my concern that patient is not able to independently make his medical decisions as he does not seem to have the capacity to weigh the outcomes of decisions.  Advance directives, concepts specific to code status, artificial feeding and hydration, and rehospitalization were considered and discussed.  Encouraged family to consider DNR/DNI status understanding evidenced based poor outcomes in  similar hospitalized patients, as the cause of the arrest is likely associated with chronic/terminal disease rather than a reversible acute cardio-pulmonary event.  Family tells me they will take some time to consider this.  We also briefly discussed how to move forward if there were no improvement in patient's kidneys.  Would he  want dialysis?  Daughter shares she is not sure he would want that.  We discussed concerns about quality of life.  Discussed with family the importance of continued conversation with family and the medical providers regarding overall plan of care and treatment options, ensuring decisions are within the context of the patients values and GOCs.    Palliative Care services outpatient were explained and offered.  Daughter shares family has already been connected to outpatient palliative.  Questions and concerns were addressed. The family was encouraged to call with questions or concerns.  Primary Decision Maker NEXT OF KIN - spouse though there is some cognitive impairment (joined by children)    SUMMARY OF RECOMMENDATIONS   -family considering changing code status to DNR/DNI -remains full code for now and family aware -Discussion had about acute illness on top of multiple chronic conditions and concerned about quality of life moving forward -PMT will follow  Discharge Planning: To Be Determined      Primary Diagnoses: Present on Admission:  Septic shock ([facility])   I have reviewed the medical record, interviewed the patient and family, and examined the patient. The following aspects are pertinent.  Past Medical History:  Diagnosis Date   Acute urinary retention    Arthritis    Asthma 2000   Back pain    Colon polyp 2012   COPD (chronic obstructive pulmonary disease) (HCC)    Emphysema of lung (HCC)    Heart murmur    Hernia 2000   Hyperlipidemia    Hypertension    Personal history of malignant neoplasm of large intestine    Personal history of tobacco use, presenting hazards to health    Sleep apnea    Special screening for malignant neoplasms, colon    Type 2 diabetes mellitus with proteinuria ([facility]) 10/11/2014   Social History   Socioeconomic History   Marital status: Married    Spouse name: Not on file   Number of children: Not on file   Years of education: Not on  file   Highest education level: GED or equivalent  Occupational History   Occupation: retired    Comment: [employer]  Tobacco Use   Smoking status: Former    Packs/day: 1.00    Years: 30.00    Total pack years: 30.00    Types: Cigarettes    Quit date: 03/23/1989    Years since quitting: 32.6    Passive exposure: Past   Smokeless tobacco: Never  Vaping Use   Vaping Use: Never used  Substance and Sexual Activity   Alcohol use: No    Alcohol/week: 0.0 standard drinks of alcohol   Drug use: No   Sexual activity: Not on file  Other Topics Concern   Not on file  Social History Narrative   [employer]    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    Social Determinants of Health   Financial Resource Strain: Low Risk  (09/01/2021)   Overall Financial Resource Strain (CARDIA)    Difficulty of Paying Living Expenses: Not hard at all  Food Insecurity: No Food Insecurity (09/01/2021)   Hunger Vital Sign    Worried About Running Out of Food in the Last Year:  Never true    Ran Out of Food in the Last Year: Never true  Transportation Needs: No Transportation Needs (09/01/2021)   PRAPARE - Hydrologist (Medical): No    Lack of Transportation (Non-Medical): No  Physical Activity: Inactive (09/01/2021)   Exercise Vital Sign    Days of Exercise per Week: 0 days    Minutes of Exercise per Session: 0 min  Stress: Stress Concern Present (09/01/2021)   [place]    Feeling of Stress : To some extent  Social Connections: Socially Isolated (09/01/2021)   Social Connection and Isolation Panel [NHANES]    Frequency of Communication with Friends and Family: Never    Frequency of Social Gatherings with Friends and Family: Once a week    Attends Religious Services: More than 4 times per year    Active Member of [employer] or Organizations: No    Attends Music therapist: Never    Marital Status: Divorced   Family History   Problem Relation Age of Onset   Diabetes Father    Esophageal cancer Mother    Alzheimer's disease Paternal Uncle    Scheduled Meds:  alum & mag hydroxide-simeth  30 mL Oral TID AC & HS   vitamin C  500 mg Oral BID   Chlorhexidine Gluconate Cloth  6 each Topical Daily   cholecalciferol  2,000 Units Oral QHS   cyanocobalamin  1,000 mcg Oral QHS   feeding supplement  237 mL Oral TID BM   insulin aspart  0-9 Units Subcutaneous Q4H   ipratropium-albuterol  3 mL Nebulization TID   lidocaine  1 patch Transdermal Q24H   magnesium oxide  400 mg Oral Daily   midodrine  10 mg Oral TID WC   mirabegron ER  25 mg Oral Daily   multivitamin with minerals  1 tablet Oral Daily   pantoprazole  40 mg Oral Daily   rosuvastatin  10 mg Oral QHS   Continuous Infusions:  sodium chloride Stopped (11/19/21 1451)   heparin 1,200 Units/hr (11/19/21 1510)   piperacillin-tazobactam (ZOSYN)  IV 100 mL/hr at 11/19/21 1510   PRN Meds:.acetaminophen **OR** acetaminophen, ondansetron **OR** ondansetron (ZOFRAN) IV Allergies  Allergen Reactions   Farxiga [Dapagliflozin] Rash    Causes a severe rash in groin area    Dulaglutide Diarrhea and Other (See Comments)   Liraglutide Diarrhea and Other (See Comments)   Jardiance [Empagliflozin] Rash   Review of Systems  Unable to perform ROS: Mental status change    Physical Exam Constitutional:      General: He is not in acute distress.    Appearance: He is ill-appearing.  Cardiovascular:     Rate and Rhythm: Normal rate. Rhythm irregular.  Pulmonary:     Effort: Pulmonary effort is normal.  Skin:    General: Skin is warm and dry.  Neurological:     Mental Status: He is alert. He is disoriented.     Vital Signs: BP 130/82   Pulse (!) 59   Temp 98.6 F (37 C)   Resp 19   Ht _0  (1.803 m)   Wt 110.4 kg   SpO2 94%   BMI 33.95 kg/m  Pain Scale: 0-10 POSS *See Group Information*: S-Acceptable,Sleep, easy to arouse Pain Score: 0-No  pain   SpO2: SpO2: 94 % O2 Device:SpO2: 94 % O2 Flow Rate: .O2 Flow Rate (L/min): 2 L/min  IO: Intake/output summary:  Intake/Output Summary (  Last 24 hours) at 11/19/2021 1521 Last data filed at 11/19/2021 1510 Gross per 24 hour  Intake 1913.49 ml  Output 2325 ml  Net -411.51 ml    LBM: Last BM Date : 11/19/21 Baseline Weight: Weight: 106.9 kg Most recent weight: Weight: 110.4 kg     Palliative Assessment/Data: PPS 40%     *Please note that this is a verbal dictation therefore any spelling or grammatical errors are due to the "[place] [employer]" system interpretation.   Juel Burrow, DNP, AGNP-C Palliative Medicine Team [phone] Pager: [phone]

## 2021-11-19 NOTE — Consult Note (Addendum)
[facility]  CONSULT NOTE    Date: 11/19/2021                  Patient Name:  Justin Griffith  MRN: [id number]  DOB: [birth year]  Age / Sex: 81 y.o., male         PCP: [facility]                 Service Requesting Consult: [place]                 Reason for Consult: Acute kidney injury            History of Present Illness: Justin Griffith is a 81 y.o.  male with past medical conditions including COPD, hypertension, dyslipidemia, on Eliquis for PE, diabetes, diastolic heart failure with EF 60 to 65%, and chronic kidney disease stage IIIb, who was admitted to [facility] on 11/17/2021 for Hyperkalemia [E87.5] Septic shock ([place]) [A41.9, R65.21] Sepsis due to pneumonia ([place]) [J18.9, A41.9] Sepsis, due to unspecified organism, unspecified whether acute organ dysfunction present [facility]) [A41.9]  Patient was sent to the emergency department from his skilled nursing facility after being found unresponsive during nighttime med Pass.  It is unknown how long patient was unresponsive.  Arousable on ED arrival and complained of nausea.  Patient seen and evaluated at bedside in ICU.  Patient does not recall actual event.  States he felt fatigued in the past few days.  Denies nausea or vomiting.  Denies chest pain or discomfort.  Denies known fever or chills.  EMS report states normal blood oxygenation with baseline nasal cannula 3 to 4 L.  Remains on 2 L at this time.  Foley catheter in place.  Currently on heparin and levo drip.  Labs on ED arrival include sodium 134, potassium 6.9, serum bicarb 20, BUN 106, creatinine 6.39 with GFR at 8, albumin 3.0, lactic acid 2.0, WBC 14.2 and hemoglobin 12.5.  UA positive for hematuria, proteinuria and leukocytes.  Urine culture positive.  CT head negative for acute changes.  Portable chest shows patchy lower left lobe airspace, possible infection.  Potassium has remained elevated with little improvement in renal function since  admission.  Medications: Outpatient medications: Medications Prior to Admission  Medication Sig Dispense Refill Last Dose   acetaminophen (TYLENOL) 325 MG tablet Take 650 mg by mouth every 6 (six) hours as needed.   prn at prn   aluminum-magnesium hydroxide-simethicone (MAALOX) 409-811-91 MG/5ML SUSP Take 30 mLs by mouth 4 (four) times daily -  before meals and at bedtime.   prn at prn   apixaban (ELIQUIS) 5 MG TABS tablet Take 1 tablet (5 mg total) by mouth 2 (two) times daily. 60 tablet 1 11/17/2021 at [place] 160-9-4.8 MCG/ACT AERO Inhale 1 puff into the lungs daily.   11/17/2021 at 0800   carvedilol (COREG) 3.125 MG tablet Take 1 tablet (3.125 mg total) by mouth 2 (two) times daily with a meal. 60 tablet 1 11/17/2021 at 1930   cephALEXin (KEFLEX) 500 MG capsule Take 500 mg by mouth 3 (three) times daily.   11/17/2021   cloNIDine (CATAPRES) 0.1 MG tablet Take 0.1 mg by mouth 2 (two) times daily.   11/17/2021 at 0800   cyanocobalamin 1000 MCG tablet Take 1 tablet (1,000 mcg total) by mouth at bedtime. 30 tablet 1 11/17/2021 at 1930   fenofibrate (TRICOR) 145 MG tablet Take 1 tablet (145 mg total) by mouth daily. Trail  tablet 1 11/17/2021 at 0800   furosemide (LASIX) 40 MG tablet Take 40 mg by mouth at bedtime.   11/17/2021 at 2000   gabapentin (NEURONTIN) 600 MG tablet Take 600 mg by mouth 2 (two) times daily.   11/17/2021 at 1930   hydrALAZINE (APRESOLINE) 50 MG tablet TAKE ONE TABLET BY MOUTH THREE TIMES A DAY FOR BLOOD PRESSURE   11/17/2021   insulin glargine-yfgn (SEMGLEE) 100 UNIT/ML Pen INJECT 10 UNITS UNDER SKIN ONCE EVERY DAY FOR DIABETES DISCARD PEN 28 DAYS AFTER OPENING. * GIVE AT THE SAME TIME DAILY *   11/17/2021 at 2000   lidocaine (LIDODERM) 5 % Place 1 patch onto the skin daily. Remove & Discard patch within 12 hours or as directed by MD 30 patch 0 11/17/2021   losartan (COZAAR) 50 MG tablet Take 1 tablet (50 mg total) by mouth daily. 30 tablet 1 11/17/2021 at 0800   magnesium  oxide (MAG-OX) 400 (240 Mg) MG tablet Take 1 tablet (400 mg total) by mouth daily. 90 tablet 0 11/17/2021 at 0800   metFORMIN (GLUCOPHAGE) 1000 MG tablet Take 1 tablet (1,000 mg total) by mouth 2 (two) times daily with a meal. 180 tablet 3 11/17/2021 at 1700   mirabegron ER (MYRBETRIQ) 50 MG TB24 tablet Take 1 tablet (50 mg total) by mouth daily. 28 tablet 0 11/17/2021 at 0800   nortriptyline (PAMELOR) 10 MG capsule Take 10 mg by mouth 2 (two) times daily.   11/17/2021 at 1930   ondansetron (ZOFRAN) 4 MG tablet Take 4 mg by mouth every 8 (eight) hours as needed for nausea or vomiting.   prn at prn   pantoprazole (PROTONIX) 40 MG tablet Take 40 mg by mouth daily.   11/17/2021 at 0800   rosuvastatin (CRESTOR) 40 MG tablet Take 1 tablet (40 mg total) by mouth at bedtime. 90 tablet 3 11/17/2021 at 1930   Tiotropium Bromide Monohydrate (SPIRIVA RESPIMAT) 2.5 MCG/ACT AERS Inhale 2 puffs into the lungs daily. 1 g 3 11/17/2021 at 0800   Vitamin D, Cholecalciferol, 25 MCG (1000 UT) CAPS Take 2,000 mcg by mouth at bedtime.   11/17/2021 at 2000    Current medications: Current Facility-Administered Medications  Medication Dose Route Frequency Provider Last Rate Last Admin   0.9 %  sodium chloride infusion  250 mL Intravenous Continuous Rust-Chester, Britton L, NP 10 mL/hr at 11/19/21 1159 Infusion Verify at 11/19/21 1159   acetaminophen (TYLENOL) tablet 650 mg  650 mg Oral Q6H PRN Mansy, Jan A, MD       Or   acetaminophen (TYLENOL) suppository 650 mg  650 mg Rectal Q6H PRN Mansy, Jan A, MD       alum & mag hydroxide-simeth (MAALOX/MYLANTA) 200-200-20 MG/5ML suspension 30 mL  30 mL Oral TID AC & HS Mansy, Jan A, MD   30 mL at 11/19/21 1229   ascorbic acid (VITAMIN C) tablet 500 mg  500 mg Oral BID Flora Lipps, MD   500 mg at 11/19/21 1031   Chlorhexidine Gluconate Cloth 2 % PADS 6 each  6 each Topical Daily Flora Lipps, MD   6 each at 11/19/21 1031   cholecalciferol (VITAMIN D3) 25 MCG (1000 UNIT) tablet 2,000  Units  2,000 Units Oral QHS Flora Lipps, MD   2,000 Units at 11/18/21 2148   cyanocobalamin (VITAMIN B12) tablet 1,000 mcg  1,000 mcg Oral QHS Mansy, Jan A, MD   1,000 mcg at 11/18/21 2135   feeding supplement (ENSURE ENLIVE / ENSURE PLUS) liquid 237  mL  237 mL Oral TID BM Flora Lipps, MD   237 mL at 11/19/21 1031   heparin ADULT infusion 100 units/mL (25000 units/227m)  1,200 Units/hr Intravenous Continuous KFlora Lipps MD 12 mL/hr at 11/19/21 1159 1,200 Units/hr at 11/19/21 1159   insulin aspart (novoLOG) injection 0-9 Units  0-9 Units Subcutaneous Q4H Rust-Chester, BHuel Cote NP   2 Units at 11/19/21 1229   ipratropium-albuterol (DUONEB) 0.5-2.5 (3) MG/3ML nebulizer solution 3 mL  3 mL Nebulization TID WLoletha Grayer MD   3 mL at 11/19/21 1333   lidocaine (LIDODERM) 5 % 1 patch  1 patch Transdermal Q24H Mansy, Jan A, MD   1 patch at 11/19/21 1033   magnesium oxide (MAG-OX) tablet 400 mg  400 mg Oral Daily Mansy, Jan A, MD   400 mg at 11/19/21 1031   midodrine (PROAMATINE) tablet 10 mg  10 mg Oral TID WC NTeressa Lower NP   10 mg at 11/19/21 1229   mirabegron ER (MYRBETRIQ) tablet 25 mg  25 mg Oral Daily BLorna Dibble RPH   25 mg at 11/19/21 1035   multivitamin with minerals tablet 1 tablet  1 tablet Oral Daily KFlora Lipps MD   1 tablet at 11/19/21 1041   ondansetron (ZOFRAN) tablet 4 mg  4 mg Oral Q6H PRN Mansy, Jan A, MD       Or   ondansetron ([facility] injection 4 mg  4 mg Intravenous Q6H PRN Mansy, Jan A, MD       pantoprazole (PROTONIX) EC tablet 40 mg  40 mg Oral Daily Wieting, Richard, MD       piperacillin-tazobactam (ZOSYN) IVPB 2.25 g  2.25 g Intravenous Q8H BLorna Dibble RPH   Stopped at 11/19/21 00962  rosuvastatin (CRESTOR) tablet 10 mg  10 mg Oral QHS BLorna Dibble RPH   10 mg at 11/18/21 2135      Allergies: Allergies  Allergen Reactions   Farxiga [Dapagliflozin] Rash    Causes a severe rash in groin area    Dulaglutide Diarrhea and Other (See Comments)    Liraglutide Diarrhea and Other (See Comments)   Jardiance [Empagliflozin] Rash      Past Medical History: Past Medical History:  Diagnosis Date   Acute urinary retention    Arthritis    Asthma 2000   Back pain    Colon polyp 2012   COPD (chronic obstructive pulmonary disease) (HCC)    Emphysema of lung (HCC)    Heart murmur    Hernia 2000   Hyperlipidemia    Hypertension    Personal history of malignant neoplasm of large intestine    Personal history of tobacco use, presenting hazards to health    Sleep apnea    Special screening for malignant neoplasms, colon    Type 2 diabetes mellitus with proteinuria ([place] 10/11/2014     Past Surgical History: Past Surgical History:  Procedure Laterality Date   CARDIAC CATHETERIZATION Left 09/25/2015   Procedure: Left Heart Cath and Coronary Angiography;  Surgeon: DYolonda Kida MD;  [facility];  Service: Cardiovascular;  Laterality: Left;   CHOLECYSTECTOMY  1970   COLON SURGERY  07-16-99   sigmoid colon resection with primary anastomosis, chemotherapy for metastatic disease   COLONOSCOPY  2001, 2012   Dr BBary Castilla tubular adenoma of the cecum and ascending colon in 2012.   COLONOSCOPY WITH PROPOFOL N/A 02/12/2016   Procedure: COLONOSCOPY WITH PROPOFOL;  Surgeon: JRobert Bellow MD;  Location:  [facility];  Service: Endoscopy;  Laterality: N/A;   CORONARY STENT INTERVENTION N/A 05/17/2020   Procedure: CORONARY STENT INTERVENTION;  Surgeon: Yolonda Kida, MD;  Location: [facility];  Service: Cardiovascular;  Laterality: N/A;  LAD   ESOPHAGOGASTRODUODENOSCOPY (EGD) WITH PROPOFOL N/A 02/12/2016   Procedure: ESOPHAGOGASTRODUODENOSCOPY (EGD) WITH PROPOFOL;  Surgeon: Robert Bellow, MD;  Location: [facility];  Service: Endoscopy;  Laterality: N/A;   HERNIA REPAIR Right    right inguinal hernia repair   JOINT REPLACEMENT Bilateral    knee replacement   KNEE SURGERY Bilateral 2010   LEFT  HEART CATH AND CORONARY ANGIOGRAPHY N/A 05/17/2020   Procedure: LEFT HEART CATH AND CORONARY ANGIOGRAPHY possible PCI and stent;  Surgeon: Yolonda Kida, MD;  Location: [place] [facility];  Service: Cardiovascular;  Laterality: N/A;   MYRINGOTOMY WITH TUBE PLACEMENT Bilateral 08/16/2014   Procedure: MYRINGOTOMY WITH TUBE PLACEMENT;  Surgeon: Carloyn Manner, MD;  Location: [facility];  Service: ENT;  Laterality: Bilateral;   PERIPHERAL VASCULAR CATHETERIZATION Right 09/04/2015   Procedure: Lower Extremity Angiography;  Surgeon: Katha Cabal, MD;  Location: [place] [facility];  Service: Cardiovascular;  Laterality: Right;   PERIPHERAL VASCULAR CATHETERIZATION  09/04/2015   Procedure: Lower Extremity Intervention;  Surgeon: Katha Cabal, MD;  Location: [place] [facility];  Service: Cardiovascular;;   TONSILLECTOMY     TYMPANOSTOMY TUBE PLACEMENT       Family History: Family History  Problem Relation Age of Onset   Diabetes Father    Esophageal cancer Mother    Alzheimer's disease Paternal Uncle      Social History: Social History   Socioeconomic History   Marital status: Married    Spouse name: Not on file   Number of children: Not on file   Years of education: Not on file   Highest education level: GED or equivalent  Occupational History   Occupation: retired    Comment: [employer]  Tobacco Use   Smoking status: Former    Packs/day: 1.00    Years: 30.00    Total pack years: 30.00    Types: Cigarettes    Quit date: 03/23/1989    Years since quitting: 32.6    Passive exposure: Past   Smokeless tobacco: Never  Vaping Use   Vaping Use: Never used  Substance and Sexual Activity   Alcohol use: No    Alcohol/week: 0.0 standard drinks of alcohol   Drug use: No   Sexual activity: Not on file  Other Topics Concern   Not on file  Social History Narrative   [employer]    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    Social Determinants of Health   Financial Resource Strain: Low Risk   (09/01/2021)   Overall Financial Resource Strain (CARDIA)    Difficulty of Paying Living Expenses: Not hard at all  Food Insecurity: No Food Insecurity (09/01/2021)   Hunger Vital Sign    Worried About Running Out of Food in the Last Year: Never true    [place] in the Last Year: Never true  Transportation Needs: No Transportation Needs (09/01/2021)   PRAPARE - Hydrologist (Medical): No    Lack of Transportation (Non-Medical): No  Physical Activity: Inactive (09/01/2021)   Exercise Vital Sign    Days of Exercise per Week: 0 days    Minutes of Exercise per Session: 0 min  Stress: Stress Concern Present (09/01/2021)   [place] -  Occupational Stress Questionnaire    Feeling of Stress : To some extent  Social Connections: Socially Isolated (09/01/2021)   Social Connection and Isolation Panel [NHANES]    Frequency of Communication with Friends and Family: Never    Frequency of Social Gatherings with Friends and Family: Once a week    Attends Religious Services: More than 4 times per year    Active Member of [employer] or Organizations: No    Attends Archivist Meetings: Never    Marital Status: Divorced  Human resources officer Violence: Not At Risk (09/01/2021)   Humiliation, Afraid, Rape, and Kick questionnaire    Fear of Current or Ex-Partner: No    Emotionally Abused: No    Physically Abused: No    Sexually Abused: No     Review of Systems: Review of Systems  Unable to perform ROS: Mental status change    Vital Signs: Blood pressure (!) 119/54, pulse (!) 35, temperature 98.2 F (36.8 C), resp. rate 14, height '5\' 11"'$  (1.803 m), weight 110.4 kg, SpO2 100 %.  Weight trends: Filed Weights   11/17/21 2152 11/18/21 0400 11/19/21 0500  Weight: 106.9 kg 109.5 kg 110.4 kg    Physical Exam: General: NAD, sitting up in bed  Head: Normocephalic, atraumatic. Moist oral mucosal membranes  Eyes: Anicteric  Lungs:  Clear  to auscultation, normal effort, [place] O2  Heart: Regular rate and rhythm  Abdomen:  Soft, nontender, obese  Extremities: 1+ peripheral edema.  Neurologic: Nonfocal, moving all four extremities  Skin: No lesions  Access: None     Lab results: Basic Metabolic Panel: Recent Labs  Lab 11/18/21 0329 11/18/21 1250 11/18/21 1756 11/19/21 0147  NA 135 136 136 138  K 5.9* 5.6* 5.2* 5.4*  CL 103 106 102 106  CO2 '23 23 23 24  '$ GLUCOSE 134* 87 142* 100*  BUN 109* 104* 104* 99*  CREATININE 5.91* 5.81* 5.60* 5.60*  CALCIUM 9.1 9.2 9.2 9.2  MG 2.3  --   --  2.3  PHOS 4.7*  --   --  3.7    Liver Function Tests: Recent Labs  Lab 11/17/21 2142  AST 26  ALT 13  ALKPHOS 39  BILITOT 0.5  PROT 6.9  ALBUMIN 3.0*   No results for input(s): "LIPASE", "AMYLASE" in the last 168 hours. No results for input(s): "AMMONIA" in the last 168 hours.  CBC: Recent Labs  Lab 11/17/21 2142 11/18/21 0329 11/19/21 0147  WBC 14.2* 12.7* 8.9  NEUTROABS 11.0*  --  6.2  HGB 12.5* 10.3* 10.0*  HCT 39.9 32.9* 31.1*  MCV 93.7 92.4 90.4  PLT 280 244 258    Cardiac Enzymes: No results for input(s): "CKTOTAL", "CKMB", "CKMBINDEX", "TROPONINI" in the last 168 hours.  BNP: Invalid input(s): "POCBNP"  CBG: Recent Labs  Lab 11/18/21 1933 11/18/21 2328 11/19/21 0330 11/19/21 0746 11/19/21 1129  GLUCAP 111* 109* 103* 121* 155*    Microbiology: Results for orders placed or performed during the hospital encounter of 11/17/21  Blood Culture (routine x 2)     Status: None (Preliminary result)   Collection Time: 11/17/21  9:42 PM   Specimen: BLOOD  Result Value Ref Range Status   Specimen Description BLOOD LEFT [facility] [place]  Final   Special Requests   Final    BOTTLES DRAWN AEROBIC AND ANAEROBIC Blood Culture adequate volume   Culture   Final    NO GROWTH 2 DAYS Performed at [facility], [address], [place], [place] [address]  Report Status PENDING  Incomplete  Urine Culture     Status:  Abnormal (Preliminary result)   Collection Time: 11/17/21  9:42 PM   Specimen: In/Out Cath Urine  Result Value Ref Range Status   Specimen Description   Final    IN/OUT CATH URINE Performed at [name], [address] [place], [place] [address]    Special Requests   Final    NONE Performed at [facility], [address], [place], [place] [address]    Culture (A)  Final    >=100,000 COLONIES/mL ENTEROCOCCUS FAECALIS CULTURE REINCUBATED FOR BETTER GROWTH SUSCEPTIBILITIES TO FOLLOW Performed at [facility], [place] [address] [place], [place] [address]    Report Status PENDING  Incomplete  Blood Culture (routine x 2)     Status: None (Preliminary result)   Collection Time: 11/17/21  9:59 PM   Specimen: BLOOD  Result Value Ref Range Status   Specimen Description BLOOD RIGHT FA  Final   Special Requests   Final    BOTTLES DRAWN AEROBIC AND ANAEROBIC Blood Culture adequate volume   Culture   Final    NO GROWTH 2 DAYS Performed at [facility], [address] [place], [place] [address]    Report Status PENDING  Incomplete  MRSA Next Gen by PCR, Nasal     Status: None   Collection Time: 11/18/21  4:49 AM   Specimen: Nasal Mucosa; Nasal Swab  Result Value Ref Range Status   MRSA by PCR Next Gen NOT DETECTED NOT DETECTED Final    Comment: (NOTE) The GeneXpert MRSA Assay (FDA approved for NASAL specimens only), is one component of a comprehensive MRSA colonization surveillance program. It is not intended to diagnose MRSA infection nor to guide or monitor treatment for MRSA infections. Test performance is not FDA approved in patients less than 30 years old. Performed at [facility], [address] [place], [place] [address]     Coagulation Studies: Recent Labs    11/17/21 2140-06-12 11/18/21 0329  LABPROT 30.3* 27.9*  INR 2.9* 2.6*    Urinalysis: Recent Labs    11/17/21 2142  COLORURINE YELLOW*  LABSPEC 1.024  PHURINE  5.0  GLUCOSEU 50*  HGBUR LARGE*  BILIRUBINUR NEGATIVE  KETONESUR NEGATIVE  PROTEINUR >=300*  NITRITE NEGATIVE  LEUKOCYTESUR MODERATE*      Imaging: DG Chest Port 1 View  Result Date: 11/18/2021 CLINICAL DATA:  Removal of malpositioned Dobbhoff tube. Rule out pneumothorax EXAM: PORTABLE CHEST 1 VIEW COMPARISON:  Yesterday FINDINGS: No pneumothorax or acute airspace opacity. Cardiomegaly. Coronary stenting. Scarring or atelectasis at the left lung base similar to prior. IMPRESSION: No acute finding. Unchanged scar or atelectatic type density at the left base. Electronically Signed   By: Jorje Guild M.D.   On: 11/18/2021 07:38   DG Abd 1 View  Result Date: 11/18/2021 CLINICAL DATA:  Confirm Dobbhoff tube placement. EXAM: ABDOMEN - 1 VIEW COMPARISON:  Chest radiograph 11/17/2021 FINDINGS: 0709 hours. The feeding tube is in the left lower lobe airway. Atelectasis is noted in the left base. There are several gaseous distended loops of bowel identified within the abdomen which may reflect ileus versus obstruction. IMPRESSION: 1. Feeding tube is in the left lower lobe airway. Recommend removal and replacement. 2. Left base atelectasis. 3. Gaseous distension of the bowel loops which may reflect underlying ileus or obstruction Electronically Signed   By: Kerby Moors M.D.   On: 11/18/2021 07:37   DG Abd 1  View  Result Date: 11/18/2021 CLINICAL DATA:  Dobbhoff placement EXAM: ABDOMEN - 1 VIEW COMPARISON:  Earlier today FINDINGS: Left lower lobe intubation by the enteric tube. This is already been corrected based on follow-up radiograph. Curvilinear scar atelectasis at the left base. No edema, effusion, or pneumothorax. Cardiomegaly. IMPRESSION: Left lower lobe airway intubation by enteric tube, already corrected at time of dictation. Electronically Signed   By: Jorje Guild M.D.   On: 11/18/2021 07:36   US RENAL  Result Date: 11/18/2021 CLINICAL DATA:  Acute on chronic renal failure EXAM:  RENAL / URINARY TRACT ULTRASOUND COMPLETE COMPARISON:  CT AP 09/30/2021 FINDINGS: Right Kidney: Renal measurements: 12.8 x 5.8 x 6.2 cm = volume: 240.3 mL. Echogenicity within normal limits. No mass or hydronephrosis visualized. Left Kidney: Renal measurements: 12.7 x 6.1 x 5.1 cm = volume: 202.9 mL. Perinephric fluid noted around the inferior pole. Echogenicity within normal limits. No mass or hydronephrosis visualized. Bladder: Decompressed around a Foley catheter. Other: None. IMPRESSION: 1. No hydronephrosis identified. 2. Mild lower pole left perinephric fluid. Electronically Signed   By: Kerby Moors M.D.   On: 11/18/2021 05:59   CT Head Wo Contrast  Result Date: 11/17/2021 CLINICAL DATA:  Altered mental status EXAM: CT HEAD WITHOUT CONTRAST TECHNIQUE: Contiguous axial images were obtained from the base of the skull through the vertex without intravenous contrast. RADIATION DOSE REDUCTION: This exam was performed according to the departmental dose-optimization program which includes automated exposure control, adjustment of the mA and/or kV according to patient size and/or use of iterative reconstruction technique. COMPARISON:  MRI brain dated 10/31/2021 FINDINGS: Brain: No evidence of acute infarction, hemorrhage, hydrocephalus, extra-axial collection or mass lesion/mass effect. Subcortical white matter and periventricular small vessel ischemic changes. Old left cerebellar infarct. Vascular: No hyperdense vessel or unexpected calcification. Skull: Normal. Negative for fracture or focal lesion. Sinuses/Orbits: The visualized paranasal sinuses are essentially clear. The mastoid air cells are unopacified. Other: None. IMPRESSION: No evidence of acute intracranial abnormality. Small vessel ischemic changes. Old left cerebellar infarct. Electronically Signed   By: Julian Hy M.D.   On: 11/17/2021 22:23   DG Chest Port 1 View  Result Date: 11/17/2021 CLINICAL DATA:  Questionable sepsis. EXAM:  PORTABLE CHEST 1 VIEW COMPARISON:  Chest x-ray 10/31/2021 FINDINGS: There are patchy airspace opacities in the left lung base with small left pleural effusion. There is a band of atelectasis in the right lung base. Cardiomediastinal silhouette is within normal limits. No pneumothorax or acute fracture. IMPRESSION: 1.  Patchy left lower lobe airspace disease worrisome for infection. 2.  Right basilar atelectasis. Electronically Signed   By: Ronney Asters M.D.   On: 11/17/2021 21:53     Assessment & Plan: Mr. TEVYN CODD is a 81 y.o.  male with past medical conditions including COPD, hypertension, dyslipidemia, on Eliquis for PE, diabetes, diastolic heart failure with EF 60 to 65%, and chronic kidney disease stage IIIb, who was admitted to [facility] on 11/17/2021 for Hyperkalemia [E87.5] Septic shock ([place]) [A41.9, R65.21] Sepsis due to pneumonia ([place]) [J18.9, A41.9] Sepsis, due to unspecified organism, unspecified whether acute organ dysfunction present ([place]) [A41.9]  Acute kidney injury with hyperkalemia on chronic kidney disease stage IIIb.  Baseline creatinine appears to be 1.86 with GFR 36 on 11/02/2021.  Acute kidney injury appears secondary to severe illness, UTI, and pneumonia.  Urine culture positive for Enterococcus.  No IV contrast exposure.  Renal ultrasound negative for hydronephrosis.  Adequate urine output recorded, 1.8 L.  Potassium  5.4, treated with Lokelma.  We will continue to monitor renal function with labs and patient presentation.  No acute need for dialysis at this time however if no improvement tomorrow, patient may need to initiate temporary dialysis.  Discussed this with patient today and he is agreeable.  Continue to avoid nephrotoxic agents and therapies.Will order Hepatitis B panel today in preparation if dialysis is needed.   2. Anemia of chronic kidney disease Normocytic Lab Results  Component Value Date   HGB 10.0 (L) 11/19/2021    Hemoglobin within acceptable range.  We  will continue to monitor  3.  Hypertension with chronic kidney disease.  Home regimen includes carvedilol, clonidine, furosemide, and hydralazine.  Was on levo during rounds.  Currently stopped, receiving midodrine 10 mg 3 times daily.  4. Diabetes mellitus type II with chronic kidney disease: insulin dependent. Home regimen includes glargine. Most recent hemoglobin A1c is 8.1 on 10/30/2021.  Metformin currently held.   LOS: 2   8/30/20231:37 PM

## 2021-11-19 NOTE — Assessment & Plan Note (Addendum)
BMI 33.45

## 2021-11-19 NOTE — Progress Notes (Signed)
ANTICOAGULATION CONSULT NOTE - Initial Consult  Pharmacy Consult for Heparin  Indication: atrial fibrillation  Allergies  Allergen Reactions   Farxiga [Dapagliflozin] Rash    Causes a severe rash in groin area    Dulaglutide Diarrhea and Other (See Comments)   Liraglutide Diarrhea and Other (See Comments)   Jardiance [Empagliflozin] Rash    Patient Measurements: Height: '5\' 11"'$  (180.3 cm) Weight: 109.5 kg (241 lb 6.5 oz) IBW/kg (Calculated) : 75.3 Heparin Dosing Weight:  98.7 kg   Vital Signs: Temp: 100.8 F (38.2 C) (08/30 0130) Temp Source: Bladder (08/30 0000) BP: 120/70 (08/30 0115) Pulse Rate: 86 (08/30 0130)  Labs: Recent Labs    11/17/21 2142 11/17/21 2358 11/18/21 0329 11/18/21 1250 11/18/21 1756 11/19/21 0147  HGB 12.5*  --  10.3*  --   --  10.0*  HCT 39.9  --  32.9*  --   --  31.1*  PLT 280  --  244  --   --  258  APTT 44*  --   --   --  83* 68*  LABPROT 30.3*  --  27.9*  --   --   --   INR 2.9*  --  2.6*  --   --   --   HEPARINUNFRC  --   --  >1.10*  --  >1.10* >1.10*  CREATININE 6.39* 6.50* 5.91* 5.81* 5.60*  --   TROPONINIHS 24* 22*  --   --   --   --      Estimated Creatinine Clearance: 13 mL/min (A) (by C-G formula based on SCr of 5.6 mg/dL (H)).   Medical History: Past Medical History:  Diagnosis Date   Acute urinary retention    Arthritis    Asthma 2000   Back pain    Colon polyp 2012   COPD (chronic obstructive pulmonary disease) (HCC)    Emphysema of lung (HCC)    Heart murmur    Hernia 2000   Hyperlipidemia    Hypertension    Personal history of malignant neoplasm of large intestine    Personal history of tobacco use, presenting hazards to health    Sleep apnea    Special screening for malignant neoplasms, colon    Type 2 diabetes mellitus with proteinuria ([place]) 10/11/2014    Medications:  Medications Prior to Admission  Medication Sig Dispense Refill Last Dose   acetaminophen (TYLENOL) 325 MG tablet Take 650 mg by mouth  every 6 (six) hours as needed.   prn at prn   aluminum-magnesium hydroxide-simethicone (MAALOX) 786-767-20 MG/5ML SUSP Take 30 mLs by mouth 4 (four) times daily -  before meals and at bedtime.   prn at prn   apixaban (ELIQUIS) 5 MG TABS tablet Take 1 tablet (5 mg total) by mouth 2 (two) times daily. 60 tablet 1 11/17/2021 at [place] 160-9-4.8 MCG/ACT AERO Inhale 1 puff into the lungs daily.   11/17/2021 at 0800   carvedilol (COREG) 3.125 MG tablet Take 1 tablet (3.125 mg total) by mouth 2 (two) times daily with a meal. 60 tablet 1 11/17/2021 at 1930   cephALEXin (KEFLEX) 500 MG capsule Take 500 mg by mouth 3 (three) times daily.   11/17/2021   cloNIDine (CATAPRES) 0.1 MG tablet Take 0.1 mg by mouth 2 (two) times daily.   11/17/2021 at 0800   cyanocobalamin 1000 MCG tablet Take 1 tablet (1,000 mcg total) by mouth at bedtime. 30 tablet 1 11/17/2021 at 1930   fenofibrate (TRICOR) 145 MG  tablet Take 1 tablet (145 mg total) by mouth daily. 90 tablet 1 11/17/2021 at 0800   furosemide (LASIX) 40 MG tablet Take 40 mg by mouth at bedtime.   11/17/2021 at 2000   gabapentin (NEURONTIN) 600 MG tablet Take 600 mg by mouth 2 (two) times daily.   11/17/2021 at 1930   hydrALAZINE (APRESOLINE) 50 MG tablet TAKE ONE TABLET BY MOUTH THREE TIMES A DAY FOR BLOOD PRESSURE   11/17/2021   insulin glargine-yfgn (SEMGLEE) 100 UNIT/ML Pen INJECT 10 UNITS UNDER SKIN ONCE EVERY DAY FOR DIABETES DISCARD PEN 28 DAYS AFTER OPENING. * GIVE AT THE SAME TIME DAILY *   11/17/2021 at 2000   lidocaine (LIDODERM) 5 % Place 1 patch onto the skin daily. Remove & Discard patch within 12 hours or as directed by MD 30 patch 0 11/17/2021   losartan (COZAAR) 50 MG tablet Take 1 tablet (50 mg total) by mouth daily. 30 tablet 1 11/17/2021 at 0800   magnesium oxide (MAG-OX) 400 (240 Mg) MG tablet Take 1 tablet (400 mg total) by mouth daily. 90 tablet 0 11/17/2021 at 0800   metFORMIN (GLUCOPHAGE) 1000 MG tablet Take 1 tablet (1,000 mg total) by  mouth 2 (two) times daily with a meal. 180 tablet 3 11/17/2021 at 1700   mirabegron ER (MYRBETRIQ) 50 MG TB24 tablet Take 1 tablet (50 mg total) by mouth daily. 28 tablet 0 11/17/2021 at 0800   nortriptyline (PAMELOR) 10 MG capsule Take 10 mg by mouth 2 (two) times daily.   11/17/2021 at 1930   ondansetron (ZOFRAN) 4 MG tablet Take 4 mg by mouth every 8 (eight) hours as needed for nausea or vomiting.   prn at prn   pantoprazole (PROTONIX) 40 MG tablet Take 40 mg by mouth daily.   11/17/2021 at 0800   rosuvastatin (CRESTOR) 40 MG tablet Take 1 tablet (40 mg total) by mouth at bedtime. 90 tablet 3 11/17/2021 at 1930   Tiotropium Bromide Monohydrate (SPIRIVA RESPIMAT) 2.5 MCG/ACT AERS Inhale 2 puffs into the lungs daily. 1 g 3 11/17/2021 at 0800   Vitamin D, Cholecalciferol, 25 MCG (1000 UT) CAPS Take 2,000 mcg by mouth at bedtime.   11/17/2021 at 2000    Assessment: Pharmacy consulted to dose heparin in this 81 year old male admitted with septic shock.  On Eliquis 5 mg PO BID at home, last dose on 8/28 @ 1930.   CrCl = 12.6 ml/min   Goal of Therapy:  Heparin level 0.3-0.7 units/ml aPTT 66 - 102 seconds Monitor platelets by anticoagulation protocol: Yes   Plan:  8/30 @ 0147:  aPTT = 68,  HL = >1.10 - aPTT therapeutic X 2 but HL elevated due to Eliquis PTA  - Will continue pt on current rate and recheck HL and aPTT on 8/31 with AM labs.   Jarius Dieudonne D 11/19/2021,2:49 AM

## 2021-11-19 NOTE — Assessment & Plan Note (Addendum)
Skin is intact but an area of blackness.  Hard to stage at this point.  Appreciate general surgery consultation and need to continue to watch this.

## 2021-11-19 NOTE — Consult Note (Signed)
[facility] SURGICAL CONSULTATION NOTE (initial) - cpt: 35009   HISTORY OF PRESENT ILLNESS (HPI):  81 y.o. male presented to [name] ED on 08/28 secondary to change in mental status. Patient is resident at [facility] and was found minimally responsive while awaiting nighttime medications. Per chart review, he was arousable to verbal and noxious stimuli and was hemodynamically stable. It appears that EMS was unable to gather much additional history. Work up was concerning for possible sepsis without obvious source. He was hypotensive and did require vasopressor support and step down admission. He was given IV cefepime, Zithromax and vancomycin. He does appear to have a known history of sacral ulceration. No family at bedside to corroborate history. He was seen by [facility] RN on 08/29 as well and recommendations reviewed. Most recent labs show a normal WBC at 8.9K, BMP consistent with ESRD, Blood Cx x2 are without growth. No pertinent imaging studies.   Surgery is consulted by hospitalist physician Dr. Loletha Grayer, MD in this context for evaluation and management of sacral ulceration in setting of sepsis without obvious etiology.  PAST MEDICAL HISTORY (PMH):  Past Medical History:  Diagnosis Date   Acute urinary retention    Arthritis    Asthma 2000   Back pain    Colon polyp 2012   COPD (chronic obstructive pulmonary disease) (HCC)    Emphysema of lung (HCC)    Heart murmur    Hernia 2000   Hyperlipidemia    Hypertension    Personal history of malignant neoplasm of large intestine    Personal history of tobacco use, presenting hazards to health    Sleep apnea    Special screening for malignant neoplasms, colon    Type 2 diabetes mellitus with proteinuria ([place]) 10/11/2014     PAST SURGICAL HISTORY ([place]):  Past Surgical History:  Procedure Laterality Date   CARDIAC CATHETERIZATION Left 09/25/2015   Procedure: Left Heart Cath and Coronary Angiography;  Surgeon: Yolonda Kida,  MD;  Location: [place] [facility];  Service: Cardiovascular;  Laterality: Left;   CHOLECYSTECTOMY  1970   COLON SURGERY  07-16-99   sigmoid colon resection with primary anastomosis, chemotherapy for metastatic disease   COLONOSCOPY  2001, 2012   Dr Bary Castilla, tubular adenoma of the cecum and ascending colon in 2012.   COLONOSCOPY WITH PROPOFOL N/A 02/12/2016   Procedure: COLONOSCOPY WITH PROPOFOL;  Surgeon: Robert Bellow, MD;  Location: [facility] ENDOSCOPY;  Service: Endoscopy;  Laterality: N/A;   CORONARY STENT INTERVENTION N/A 05/17/2020   Procedure: CORONARY STENT INTERVENTION;  Surgeon: Yolonda Kida, MD;  Location: [place] [facility];  Service: Cardiovascular;  Laterality: N/A;  LAD   ESOPHAGOGASTRODUODENOSCOPY (EGD) WITH PROPOFOL N/A 02/12/2016   Procedure: ESOPHAGOGASTRODUODENOSCOPY (EGD) WITH PROPOFOL;  Surgeon: Robert Bellow, MD;  Location: [facility] ENDOSCOPY;  Service: Endoscopy;  Laterality: N/A;   HERNIA REPAIR Right    right inguinal hernia repair   JOINT REPLACEMENT Bilateral    knee replacement   KNEE SURGERY Bilateral 2010   LEFT HEART CATH AND CORONARY ANGIOGRAPHY N/A 05/17/2020   Procedure: LEFT HEART CATH AND CORONARY ANGIOGRAPHY possible PCI and stent;  Surgeon: Yolonda Kida, MD;  Location: [place] [facility];  Service: Cardiovascular;  Laterality: N/A;   MYRINGOTOMY WITH TUBE PLACEMENT Bilateral 08/16/2014   Procedure: MYRINGOTOMY WITH TUBE PLACEMENT;  Surgeon: Carloyn Manner, MD;  Location: [facility] ORS;  Service: ENT;  Laterality: Bilateral;   PERIPHERAL VASCULAR CATHETERIZATION Right 09/04/2015   Procedure: Lower Extremity Angiography;  Surgeon: Katha Cabal, MD;  Location: [place] [facility];  Service: Cardiovascular;  Laterality: Right;   PERIPHERAL VASCULAR CATHETERIZATION  09/04/2015   Procedure: Lower Extremity Intervention;  Surgeon: Katha Cabal, MD;  Location: [place] [facility];  Service: Cardiovascular;;   TONSILLECTOMY     TYMPANOSTOMY  TUBE PLACEMENT       MEDICATIONS:  Prior to Admission medications   Medication Sig Start Date End Date Taking? Authorizing Provider  acetaminophen (TYLENOL) 325 MG tablet Take 650 mg by mouth every 6 (six) hours as needed.   Yes [provider]  aluminum-magnesium hydroxide-simethicone (MAALOX) 200-200-20 MG/5ML SUSP Take 30 mLs by mouth 4 (four) times daily -  before meals and at bedtime.   Yes [provider]  apixaban (ELIQUIS) 5 MG TABS tablet Take 1 tablet (5 mg total) by mouth 2 (two) times daily. 10/14/21  Yes Mecum, Erin E, PA-C  BREZTRI AEROSPHERE 160-9-4.8 MCG/ACT AERO Inhale 1 puff into the lungs daily. 08/19/21  Yes [provider]  carvedilol (COREG) 3.125 MG tablet Take 1 tablet (3.125 mg total) by mouth 2 (two) times daily with a meal. 11/04/21  Yes Fritzi Mandes, MD  cephALEXin (KEFLEX) 500 MG capsule Take 500 mg by mouth 3 (three) times daily.   Yes [provider]  cloNIDine (CATAPRES) 0.1 MG tablet Take 0.1 mg by mouth 2 (two) times daily. 12/16/20  Yes [provider]  cyanocobalamin 1000 MCG tablet Take 1 tablet (1,000 mcg total) by mouth at bedtime. 11/04/21  Yes Fritzi Mandes, MD  fenofibrate (TRICOR) 145 MG tablet Take 1 tablet (145 mg total) by mouth daily. 09/11/21  Yes Vigg, Avanti, MD  furosemide (LASIX) 40 MG tablet Take 40 mg by mouth at bedtime.   Yes [provider]  gabapentin (NEURONTIN) 600 MG tablet Take 600 mg by mouth 2 (two) times daily. 10/27/21  Yes [provider]  hydrALAZINE (APRESOLINE) 50 MG tablet TAKE ONE TABLET BY MOUTH THREE TIMES A DAY FOR BLOOD PRESSURE 12/16/20  Yes [provider]  insulin glargine-yfgn (SEMGLEE) 100 UNIT/ML Pen INJECT 10 UNITS UNDER SKIN ONCE EVERY DAY FOR DIABETES DISCARD PEN 28 DAYS AFTER OPENING. * GIVE AT THE SAME TIME DAILY * 08/04/21  Yes [provider]  lidocaine (LIDODERM) 5 % Place 1 patch onto the skin daily. Remove & Discard patch within 12 hours or  as directed by MD 10/15/21  Yes [name], Jolene T, NP  losartan (COZAAR) 50 MG tablet Take 1 tablet (50 mg total) by mouth daily. 11/05/21  Yes Fritzi Mandes, MD  magnesium oxide (MAG-OX) 400 (240 Mg) MG tablet Take 1 tablet (400 mg total) by mouth daily. 06/27/21  Yes Lorella Nimrod, MD  metFORMIN (GLUCOPHAGE) 1000 MG tablet Take 1 tablet (1,000 mg total) by mouth 2 (two) times daily with a meal. 10/16/20 11/17/21 Yes Vigg, Avanti, MD  mirabegron ER (MYRBETRIQ) 50 MG TB24 tablet Take 1 tablet (50 mg total) by mouth daily. 10/06/21  Yes Vaillancourt, Aldona Bar, PA-C  nortriptyline (PAMELOR) 10 MG capsule Take 10 mg by mouth 2 (two) times daily. 09/01/21  Yes [provider]  ondansetron (ZOFRAN) 4 MG tablet Take 4 mg by mouth every 8 (eight) hours as needed for nausea or vomiting.   Yes [provider]  pantoprazole (PROTONIX) 40 MG tablet Take 40 mg by mouth daily.   Yes [provider]  rosuvastatin (CRESTOR) 40 MG tablet Take 1 tablet (40 mg total) by mouth at bedtime. 09/11/21  Yes  Vigg, Avanti, MD  Tiotropium Bromide Monohydrate (SPIRIVA RESPIMAT) 2.5 MCG/ACT AERS Inhale 2 puffs into the lungs daily. 05/15/20  Yes Vigg, Avanti, MD  Vitamin D, Cholecalciferol, 25 MCG (1000 UT) CAPS Take 2,000 mcg by mouth at bedtime.   Yes [provider]     ALLERGIES:  Allergies  Allergen Reactions   Farxiga [Dapagliflozin] Rash    Causes a severe rash in groin area    Dulaglutide Diarrhea and Other (See Comments)   Liraglutide Diarrhea and Other (See Comments)   Jardiance [Empagliflozin] Rash     SOCIAL HISTORY:  Social History   Socioeconomic History   Marital status: Married    Spouse name: Not on file   Number of children: Not on file   Years of education: Not on file   Highest education level: GED or equivalent  Occupational History   Occupation: retired    Comment: [employer]  Tobacco Use   Smoking status: Former    Packs/day: 1.00    Years: 30.00    Total pack  years: 30.00    Types: Cigarettes    Quit date: 03/23/1989    Years since quitting: 32.6    Passive exposure: Past   Smokeless tobacco: Never  Vaping Use   Vaping Use: Never used  Substance and Sexual Activity   Alcohol use: No    Alcohol/week: 0.0 standard drinks of alcohol   Drug use: No   Sexual activity: Not on file  Other Topics Concern   Not on file  Social History Narrative   [employer]    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    Social Determinants of Health   Financial Resource Strain: Low Risk  (09/01/2021)   Overall Financial Resource Strain (CARDIA)    Difficulty of Paying Living Expenses: Not hard at all  Food Insecurity: No Food Insecurity (09/01/2021)   Hunger Vital Sign    Worried About Running Out of Food in the Last Year: Never true    [place] in the Last Year: Never true  Transportation Needs: No Transportation Needs (09/01/2021)   PRAPARE - Hydrologist (Medical): No    Lack of Transportation (Non-Medical): No  Physical Activity: Inactive (09/01/2021)   Exercise Vital Sign    Days of Exercise per Week: 0 days    Minutes of Exercise per Session: 0 min  Stress: Stress Concern Present (09/01/2021)   [place]    Feeling of Stress : To some extent  Social Connections: Socially Isolated (09/01/2021)   Social Connection and Isolation Panel [NHANES]    Frequency of Communication with Friends and Family: Never    Frequency of Social Gatherings with Friends and Family: Once a week    Attends Religious Services: More than 4 times per year    Active Member of [employer] or Organizations: No    Attends Archivist Meetings: Never    Marital Status: Divorced  Human resources officer Violence: Not At Risk (09/01/2021)   Humiliation, Afraid, Rape, and Kick questionnaire    Fear of Current or Ex-Partner: No    Emotionally Abused: No    Physically Abused: No    Sexually Abused: No      FAMILY HISTORY:  Family History  Problem Relation Age of Onset   Diabetes Father    Esophageal cancer Mother    Alzheimer's disease Paternal Uncle       REVIEW OF SYSTEMS:  Review of Systems  Constitutional:  Negative for chills and fever.  Cardiovascular:  Negative for chest pain and palpitations.  Gastrointestinal:  Negative for abdominal pain, nausea and vomiting.  Skin:        + Sacral Ulceration   Neurological:  Negative for dizziness and headaches.  All other systems reviewed and are negative.   VITAL SIGNS:  Temp:  [98.1 F (36.7 C)-100.9 F (38.3 C)] 98.1 F (36.7 C) (08/30 1100) Pulse Rate:  [34-179] 82 (08/30 1100) Resp:  [7-29] 15 (08/30 1100) BP: (78-141)/(32-118) 122/61 (08/30 1100) SpO2:  [94 %-100 %] 100 % (08/30 1100) Weight:  [110.4 kg] 110.4 kg (08/30 0500)     Height: '5\' 11"'$  (180.3 cm) Weight: 110.4 kg BMI (Calculated): 33.96   INTAKE/OUTPUT:  08/29 0701 - 08/30 0700 In: 1168.5 [P.O.:240; I.V.:578.5; IV Piggyback:350] Out: 1875 [Urine:1875]  PHYSICAL EXAM:  Physical Exam Vitals and nursing note reviewed. Exam conducted with a chaperone present.  Constitutional:      General: He is not in acute distress.    Appearance: Normal appearance. He is not ill-appearing.     Comments: Patient resting in bed, arouses to verbal stimuli, NAD  Eyes:     General: No scleral icterus.    Conjunctiva/sclera: Conjunctivae normal.  Cardiovascular:     Rate and Rhythm: Normal rate.     Pulses: Normal pulses.  Pulmonary:     Effort: Pulmonary effort is normal. No respiratory distress.     Comments: On  Genitourinary:    Comments: Foley in place  Skin:    General: Skin is warm and dry.     Findings: Wound present.          Comments: There is an unstageable ulceration to the right sacrum/gluteus, there is a small eschar present which has yet to demarcate, no evidence of underlying abscess nor necrosis, there is irritation to the surrounding skin consistent  with moisture exposure  Neurological:     General: No focal deficit present.     Mental Status: He is alert and oriented to person, place, and time.  Psychiatric:        Mood and Affect: Mood normal.        Behavior: Behavior normal.      Labs:     Latest Ref Rng & Units 11/19/2021    1:47 AM 11/18/2021    3:29 AM 11/17/2021    9:42 PM  CBC  WBC 4.0 - 10.5 K/uL 8.9  12.7  14.2   Hemoglobin 13.0 - 17.0 g/dL 10.0  10.3  12.5   Hematocrit 39.0 - 52.0 % 31.1  32.9  39.9   Platelets 150 - 400 K/uL 258  244  280       Latest Ref Rng & Units 11/19/2021    1:47 AM 11/18/2021    5:56 PM 11/18/2021   12:50 PM  CMP  Glucose 70 - 99 mg/dL 100  142  87   BUN 8 - 23 mg/dL 99  104  104   Creatinine 0.61 - 1.24 mg/dL 5.60  5.60  5.81   Sodium 135 - 145 mmol/L 138  136  136   Potassium 3.5 - 5.1 mmol/L 5.4  5.2  5.6   Chloride 98 - 111 mmol/L 106  102  106   CO2 22 - 32 mmol/L '24  23  23   '$ Calcium 8.9 - 10.3 mg/dL 9.2  9.2  9.2      Imaging studies:  No pertinent imaging studies  Assessment/Plan: (ICD-10's: L89.150) 81 y.o. male admitted with sepsis of unclear origin, found to have unstageable sacral ulceration although this does not appear overtly infected, necrotic, nor the likely source of his sepsis, complicated by numerous comorbid conditions.    - At present time, there does not appear to be any indication for debridement of his sacral wound. There is no evidence of any underlying necrosis or abscess on examination and his leukocytosis has also resolved. He is also no longer requiring vasopressor support.  - Agree with local wound care recommendations provided by [place] RN on 08/29. Frequent repositioning, low air loss mattress, pressure offloading.     - Continue IV Abx (Zosyn)   - Pain control prn   - Further management per primary service  All of the above findings and recommendations were discussed with the patient, and all of patient's questions were answered to his expressed  satisfaction.  Thank you for the opportunity to participate in this patient's care.   -- Edison Simon, PA-C [facility] 11/19/2021, 12:29 PM M-F: 7am - 4pm

## 2021-11-19 NOTE — Progress Notes (Signed)
Progress Note   Patient: Justin Griffith OJJ:[id number] DOB: Jun 02, 1940 DOA: 11/17/2021     2 DOS: the patient was seen and examined on 11/19/2021    Assessment and Plan: * Septic shock ([place]) - Sepsis manifested by leukocytosis and tachypnea and severe sepsis as manifested by hypotension that persistent despite aggressive hydration and therefore he met criteria for septic shock. -Sepsis is likely secondary to Enterococcus UTI. - Off Levophed from this morning -Continue Zosyn  Acute lower UTI Enterococcus UTI.  On Zosyn.  Hyperkalemia Treated again this morning with IV medications and Lokelma.  Nephrology consultation.  Likely from chronic kidney disease.  Dyslipidemia Continue Crestor  Acute kidney injury superimposed on chronic kidney disease ([place]) - This is AKI superimposed on stage IIIb chronic kidney disease.  Patient came in with creatinine of 6.39 and baseline creatinine around 1.86. -Creatinine today is 5.6 with a GFR of 10   Lobar pneumonia ([place]) Pneumonia ruled out.  Repeat chest x-ray read as negative.  Obesity (BMI 30-39.9) BMI 33.95  Pressure injury of sacral region, unstageable ([place]) Skin is intact but an area of blackness.  Hard to stage at this point.  Appreciate general surgery consultation  Type 2 diabetes mellitus with chronic kidney disease, without long-term current use of insulin (HCC) - Continue supplement coverage with NovoLog. - We will hold off Glucophage given acute kidney injury  GERD without esophagitis - We will continue PPI therapy.        Subjective: Patient feeling better.  Off Levophed since this morning at 8 AM.  Patient offers no complaints.  No cough or nausea or vomiting.  Admitted with sepsis.  Physical Exam: Vitals:   11/19/21 0900 11/19/21 1000 11/19/21 1100 11/19/21 1200  BP: (!) 129/49 (!) 106/53 122/61 (!) 119/54  Pulse: (!) 38 (!) 34 82 (!) 35  Resp: _0 Temp: 98.6 F (37 C) 98.4 F (36.9 C) 98.1 F (36.7 C)  98.2 F (36.8 C)  TempSrc:      SpO2: 99% 94% 100% 100%  Weight:      Height:       Physical Exam HENT:     Head: Normocephalic.     Mouth/Throat:     Pharynx: No oropharyngeal exudate.  Eyes:     General: Lids are normal.     Conjunctiva/sclera: Conjunctivae normal.  Cardiovascular:     Rate and Rhythm: Normal rate and regular rhythm.     Heart sounds: Normal heart sounds, S1 normal and S2 normal.  Pulmonary:     Breath sounds: No decreased breath sounds, wheezing, rhonchi or rales.  Abdominal:     Palpations: Abdomen is soft.     Tenderness: There is no abdominal tenderness.  Musculoskeletal:     Right lower leg: Swelling present.     Left lower leg: Swelling present.  Skin:    General: Skin is warm.     Comments: Redness in the sacral area and also an area of black in the center.  Neurological:     Mental Status: He is alert.     Comments: Answers questions appropriately     Data Reviewed: Urine culture growing Enterococcus faecalis, blood cultures negative, creatinine 5.6, potassium 5.4, hemoglobin 10  Family Communication: Patient's daughter and patient's wife on the phone  Disposition: Status is: Inpatient Remains inpatient appropriate because: Patient just came off Levophed this morning, hopefully will be able to get out of the ICU this afternoon  Planned Discharge Destination: Rehab  Time spent: 28 minutes  Author: Loletha Grayer, MD 11/19/2021 1:18 PM  For on call review [email or web address].

## 2021-11-20 DIAGNOSIS — Z515 Encounter for palliative care: Secondary | ICD-10-CM | POA: Diagnosis not present

## 2021-11-20 DIAGNOSIS — E875 Hyperkalemia: Secondary | ICD-10-CM | POA: Diagnosis not present

## 2021-11-20 DIAGNOSIS — N179 Acute kidney failure, unspecified: Secondary | ICD-10-CM | POA: Diagnosis not present

## 2021-11-20 DIAGNOSIS — N390 Urinary tract infection, site not specified: Secondary | ICD-10-CM | POA: Diagnosis not present

## 2021-11-20 DIAGNOSIS — Z7189 Other specified counseling: Secondary | ICD-10-CM | POA: Diagnosis not present

## 2021-11-20 DIAGNOSIS — A419 Sepsis, unspecified organism: Secondary | ICD-10-CM | POA: Diagnosis not present

## 2021-11-20 LAB — CBC WITH DIFFERENTIAL/PLATELET
Abs Immature Granulocytes: 0.05 10*3/uL (ref 0.00–0.07)
Basophils Absolute: 0 10*3/uL (ref 0.0–0.1)
Basophils Relative: 0 %
Eosinophils Absolute: 0.2 10*3/uL (ref 0.0–0.5)
Eosinophils Relative: 3 %
HCT: 30.3 % — ABNORMAL LOW (ref 39.0–52.0)
Hemoglobin: 9.9 g/dL — ABNORMAL LOW (ref 13.0–17.0)
Immature Granulocytes: 1 %
Lymphocytes Relative: 23 %
Lymphs Abs: 1.5 10*3/uL (ref 0.7–4.0)
MCH: 29.6 pg (ref 26.0–34.0)
MCHC: 32.7 g/dL (ref 30.0–36.0)
MCV: 90.7 fL (ref 80.0–100.0)
Monocytes Absolute: 0.6 10*3/uL (ref 0.1–1.0)
Monocytes Relative: 9 %
Neutro Abs: 4.3 10*3/uL (ref 1.7–7.7)
Neutrophils Relative %: 64 %
Platelets: 249 10*3/uL (ref 150–400)
RBC: 3.34 MIL/uL — ABNORMAL LOW (ref 4.22–5.81)
RDW: 13.2 % (ref 11.5–15.5)
WBC: 6.6 10*3/uL (ref 4.0–10.5)
nRBC: 0 % (ref 0.0–0.2)

## 2021-11-20 LAB — GLUCOSE, CAPILLARY
Glucose-Capillary: 116 mg/dL — ABNORMAL HIGH (ref 70–99)
Glucose-Capillary: 162 mg/dL — ABNORMAL HIGH (ref 70–99)
Glucose-Capillary: 144 mg/dL — ABNORMAL HIGH (ref 70–99)
Glucose-Capillary: 172 mg/dL — ABNORMAL HIGH (ref 70–99)
Glucose-Capillary: 147 mg/dL — ABNORMAL HIGH (ref 70–99)

## 2021-11-20 LAB — APTT: aPTT: 70 seconds — ABNORMAL HIGH (ref 24–36)

## 2021-11-20 LAB — BASIC METABOLIC PANEL
Anion gap: 8 (ref 5–15)
BUN: 71 mg/dL — ABNORMAL HIGH (ref 8–23)
CO2: 26 mmol/L (ref 22–32)
Calcium: 9.2 mg/dL (ref 8.9–10.3)
Chloride: 106 mmol/L (ref 98–111)
Creatinine, Ser: 3.58 mg/dL — ABNORMAL HIGH (ref 0.61–1.24)
GFR, Estimated: 16 mL/min — ABNORMAL LOW (ref >60)
Glucose, Bld: 130 mg/dL — ABNORMAL HIGH (ref 70–99)
Potassium: 4.7 mmol/L (ref 3.5–5.1)
Sodium: 140 mmol/L (ref 135–145)

## 2021-11-20 LAB — HEPARIN LEVEL (UNFRACTIONATED): Heparin Unfractionated: >1.1 IU/mL — ABNORMAL HIGH (ref 0.30–0.70)

## 2021-11-20 LAB — HEPATITIS B SURFACE ANTIGEN: Hepatitis B Surface Ag: NONREACTIVE

## 2021-11-20 LAB — PHOSPHORUS: Phosphorus: 2.6 mg/dL (ref 2.5–4.6)

## 2021-11-20 LAB — MAGNESIUM: Magnesium: 2.1 mg/dL (ref 1.7–2.4)

## 2021-11-20 MED ORDER — MIDODRINE HCL 5 MG PO TABS
2.5000 mg | ORAL_TABLET | Freq: Three times a day (TID) | ORAL | Status: DC
  Administered 2021-11-20: 2.5 mg via ORAL
  Filled 2021-11-20: qty 1

## 2021-11-20 MED ORDER — INSULIN ASPART 100 UNIT/ML IJ SOLN
0.0000 [IU] | Freq: Three times a day (TID) | INTRAMUSCULAR | Status: DC
  Administered 2021-11-20 – 2021-11-21 (×3): 2 [IU] via SUBCUTANEOUS
  Administered 2021-11-21 (×2): 1 [IU] via SUBCUTANEOUS
  Administered 2021-11-22: 2 [IU] via SUBCUTANEOUS
  Administered 2021-11-23: 1 [IU] via SUBCUTANEOUS
  Administered 2021-11-23: 2 [IU] via SUBCUTANEOUS
  Administered 2021-11-24: 1 [IU] via SUBCUTANEOUS
  Filled 2021-11-20 (×8): qty 1

## 2021-11-20 MED ORDER — IPRATROPIUM-ALBUTEROL 0.5-2.5 (3) MG/3ML IN SOLN: 3.0000 mL | Freq: Four times a day (QID) | RESPIRATORY_TRACT | Status: DC | PRN

## 2021-11-20 MED ORDER — APIXABAN 2.5 MG PO TABS
2.5000 mg | ORAL_TABLET | Freq: Two times a day (BID) | ORAL | Status: DC
  Administered 2021-11-20 – 2021-11-24 (×9): 2.5 mg via ORAL
  Filled 2021-11-20 (×9): qty 1

## 2021-11-20 MED ORDER — MIDODRINE HCL 5 MG PO TABS
5.0000 mg | ORAL_TABLET | Freq: Three times a day (TID) | ORAL | Status: DC
  Administered 2021-11-20: 5 mg via ORAL
  Filled 2021-11-20: qty 1

## 2021-11-20 MED ORDER — INSULIN ASPART 100 UNIT/ML IJ SOLN: 0.0000 [IU] | Freq: Three times a day (TID) | INTRAMUSCULAR | Status: DC

## 2021-11-20 MED ORDER — AMOXICILLIN 500 MG PO CAPS
500.0000 mg | ORAL_CAPSULE | Freq: Two times a day (BID) | ORAL | Status: DC
Start: 2021-11-20 — End: 2021-11-21
  Administered 2021-11-20 (×2): 500 mg via ORAL
  Filled 2021-11-20 (×3): qty 1

## 2021-11-20 NOTE — Progress Notes (Signed)
Progress Note   Patient: Justin Griffith DQQ:[id number] DOB: 1940/09/21 DOA: 11/17/2021     3 DOS: the patient was seen and examined on 11/20/2021    Assessment and Plan: * Septic shock ([place]) - Sepsis manifested by leukocytosis and tachypnea and severe sepsis as manifested by hypotension that persistent despite aggressive hydration and therefore he met criteria for septic shock. -Sepsis is likely secondary to Enterococcus UTI. - Off Levophed from yesterday morning. -Titrated midodrine down to 5 mg 3 times a day this morning and will decrease down to 2.5 mg this afternoon. -Spoke with infectious disease pharmacist and will switch Zosyn over to amoxicillin.  Acute lower UTI Catheter related Enterococcus UTI.  Switch Zosyn over to amoxicillin.  Foley catheter changed in the emergency room.  Acute kidney injury superimposed on chronic kidney disease ([place]) - This is AKI superimposed on stage IIIb chronic kidney disease.  Patient came in with creatinine of 6.39 and baseline creatinine around 1.86. -Creatinine today is 3.58 with a GFR of 16   Hyperkalemia Improved with improvement of kidney function.  Dyslipidemia Continue Crestor  Lobar pneumonia ([place]) Pneumonia ruled out.  Repeat chest x-ray read as negative.  Obesity (BMI 30-39.9) BMI 33.95  Pressure injury of sacral region, unstageable ([place]) Skin is intact but an area of blackness.  Hard to stage at this point.  Appreciate general surgery consultation  Type 2 diabetes mellitus with chronic kidney disease, without long-term current use of insulin (HCC) - Continue supplement coverage with NovoLog. - We will hold off Glucophage given acute kidney injury  GERD without esophagitis - We will continue PPI therapy.        Subjective: Patient feeling okay.  Appetite picking up.  Admitted with septic shock.  Physical Exam: Vitals:   11/19/21 2314 11/20/21 0741 11/20/21 0806 11/20/21 1312  BP: (!) 149/67  (!) 122/51   Pulse: 67   (!) 57   Resp: 18  20   Temp: (!) 97.5 F (36.4 C)  97.8 F (36.6 C)   TempSrc:   Oral   SpO2: 99% 97% 100% 96%  Weight:      Height:       Physical Exam HENT:     Head: Normocephalic.     Mouth/Throat:     Pharynx: No oropharyngeal exudate.  Eyes:     General: Lids are normal.     Conjunctiva/sclera: Conjunctivae normal.  Cardiovascular:     Rate and Rhythm: Normal rate and regular rhythm.     Heart sounds: Normal heart sounds, S1 normal and S2 normal.  Pulmonary:     Breath sounds: No decreased breath sounds, wheezing, rhonchi or rales.  Abdominal:     Palpations: Abdomen is soft.     Tenderness: There is no abdominal tenderness.  Musculoskeletal:     Right lower leg: Swelling present.     Left lower leg: Swelling present.  Skin:    General: Skin is warm.     Comments: Redness in the sacral area and also an area of black in the center.  Neurological:     Mental Status: He is alert.     Comments: Answers questions appropriately     Data Reviewed: Creatinine down to 3.58 and potassium of 4.7, hemoglobin 9.9, white blood cell count 6.6, platelet count 249  Family Communication: Spoke with patient's daughter on the phone  Disposition: Status is: Inpatient Remains inpatient appropriate because: We are watching creatinine coming down.  Down to 3.34 today.  Planned Discharge  Destination: Skilled nursing facility    Time spent: 28 minutes  Author: Loletha Grayer, MD 11/20/2021 3:57 PM  For on call review [email or web address].

## 2021-11-20 NOTE — TOC Progression Note (Addendum)
Transition of Care [facility]) - Progression Note    Patient Details  Name: Justin Griffith MRN: [id number] Date of Birth: 03-06-1941  Transition of Care [facility]) CM/SW [place], RN Phone Number: 11/20/2021, 4:24 PM  Clinical Narrative:    Spoke with the patient's son Consuelo, he requested that I speak with [name] the step daughter and she is allowed to make and decision he stated that from his stand point the patient can go back to [facility]< he stated that he was at [facility] previously but not sure how many day, I explained the 20 days at no cost and then copay days He stated understanding  I called and Spoke to [place]  She stated that he was at [facility] previously and he was there about 2 weeks but not sure the exact amount of days I explained the copay days and she was already aware that it is apporx 250$ per day after day 20 She is agreeable for a bed search and would like for him to go back to [facility] if able [employer] sent  Expected Discharge Plan: Skilled Nursing Facility Barriers to Discharge: Continued Medical Work up  Expected Discharge Plan and Services Expected Discharge Plan: [place]   Discharge Planning Services: CM Consult Post Acute Care Choice: [place] Living arrangements for the past 2 months: Single Family Home                                       Social Determinants of Health (SDOH) Interventions    Readmission Risk Interventions    09/21/2021   10:42 AM 05/21/2020   10:01 AM 05/18/2020   12:17 PM  Readmission Risk Prevention Plan  Transportation Screening Complete Complete Complete  PCP or Specialist Appt within 3-5 Days Complete Complete   HRI or Home Care Consult Complete Complete Complete  Social Work Consult for [place] Planning/Counseling Complete Complete Complete  Palliative Care Screening Not Applicable Not Applicable Not Applicable  Medication Review Human resources officer) Complete Complete Complete

## 2021-11-20 NOTE — Progress Notes (Signed)
Daily Progress Note   Patient Name: Justin Griffith       Date: 11/20/2021 DOB: [birth year]  Age: 81 y.o. MRN#: [id number] Attending Physician: Loletha Grayer, MD Primary Care Physician: Charlynne Cousins, MD (Inactive) Admit Date: 11/17/2021  Reason for Consultation/Follow-up: Establishing goals of care  Subjective: Patient awake and alert this morning feeding himself.  He has no complaints.  He has limited understanding of situation but is able to tell me he is aware of the wound on his backside and aware of his need for rehab.  He gives me permission to call his [place].  Length of Stay: 3  Current Medications: Scheduled Meds:   alum & mag hydroxide-simeth  30 mL Oral TID AC & HS   amoxicillin  500 mg Oral Q12H   apixaban  2.5 mg Oral BID   vitamin C  500 mg Oral BID   Chlorhexidine Gluconate Cloth  6 each Topical Daily   cholecalciferol  2,000 Units Oral QHS   cyanocobalamin  1,000 mcg Oral QHS   feeding supplement  237 mL Oral TID BM   insulin aspart  0-9 Units Subcutaneous TID WC   ipratropium-albuterol  3 mL Nebulization TID   lidocaine  1 patch Transdermal Q24H   magnesium oxide  400 mg Oral Daily   midodrine  5 mg Oral TID WC   multivitamin with minerals  1 tablet Oral Daily   pantoprazole  40 mg Oral Daily   rosuvastatin  10 mg Oral QHS    Continuous Infusions:  sodium chloride Stopped (11/19/21 1451)    PRN Meds: acetaminophen **OR** acetaminophen, ondansetron **OR** ondansetron (ZOFRAN) IV  Physical Exam Constitutional:      General: He is not in acute distress.    Appearance: He is ill-appearing.  Pulmonary:     Effort: Pulmonary effort is normal.  Skin:    General: Skin is warm and dry.  Neurological:     Mental Status: He is alert.     Comments: Somewhat  oriented but easily confused             Vital Signs: BP (!) 122/51 (BP Location: Left Arm)   Pulse (!) 57   Temp 97.8 F (36.6 C) (Oral)   Resp 20   Ht '5\' 11"'$  (1.803 m)   Wt 110.4 kg   SpO2 100%   BMI 33.95 kg/m  SpO2: SpO2: 100 % O2 Device: O2 Device: Nasal Cannula O2 Flow Rate: O2 Flow Rate (L/min): 2 L/min  Intake/output summary:  Intake/Output Summary (Last 24 hours) at 11/20/2021 1235 Last data filed at 11/20/2021 0520 Gross per 24 hour  Intake 577.33 ml  Output 1900 ml  Net -1322.67 ml   LBM: Last BM Date : 11/19/21 Baseline Weight: Weight: 106.9 kg Most recent weight: Weight: 110.4 kg       Palliative Assessment/Data: PPS 40%      Patient Active Problem List   Diagnosis Date Noted   Pressure injury of sacral region, unstageable ([place])    Septic shock ([place]) 11/18/2021   Lobar pneumonia ([place]) 11/18/2021   Acute kidney injury superimposed on chronic kidney disease ([place]) 11/18/2021   Acute lower UTI 11/18/2021   Type 2  diabetes mellitus with chronic kidney disease, without long-term current use of insulin ([place]) 11/18/2021   Hyperkalemia    Recurrent falls 10/31/2021   Stroke ([place]) 10/31/2021   HLD (hyperlipidemia) 10/31/2021   Type II diabetes mellitus with renal manifestations ([place]) 10/31/2021   CAD (coronary artery disease) 10/31/2021   Chronic diastolic CHF (congestive heart failure) ([place]) 10/31/2021   Right elbow pain 10/15/2021   Gross hematuria 10/12/2021   Chronic anticoagulation 10/12/2021   Stage 3b chronic kidney disease (CKD) ([place]) 10/12/2021   History of pulmonary embolism 09/18/21 10/06/2021   Essential hypertension 09/19/2021   Dyslipidemia 09/19/2021   Generalized weakness 06/25/2021   Lymphedema 03/17/2021   Headache disorder 02/27/2021   Falls frequently 11/28/2020   Aortic atherosclerosis ([place]) 06/12/2020   History of embolic stroke 35/36/1443   At high risk for falls 05/08/2020   History of cold sores 05/06/2020   History of non-ST  elevation myocardial infarction (NSTEMI) 03/28/2020   Elevated TSH 02/25/2020   Poor mobility 02/02/2020   Coronary artery disease 01/23/2020   History of CVA (cerebrovascular accident) 01/23/2020   Memory changes 01/01/2020   Chronic venous insufficiency 05/23/2019   B12 deficiency 04/23/2019   Constipation 04/21/2019   Tremor 11/14/2018   Obesity (BMI 30-39.9) 09/07/2017   Chronic obstructive pulmonary disease (COPD) ([place]) 04/29/2017   Intertrigo 02/02/2017   BPH with obstruction/lower urinary tract symptoms Foley dependent 07/23/2016   Advanced care planning/counseling discussion 15/40/0867   Eosinophilic esophagitis 61/95/0932   PAD (peripheral artery disease) ([place]) 01/08/2016   GERD without esophagitis 01/06/2016   Hyperlipidemia associated with type 2 diabetes mellitus ([place]) 01/16/2015   Sleep apnea 01/16/2015   BMI 40.0-44.9, adult ([place]) 01/16/2015   Hypertension associated with diabetes ([place]) 10/11/2014   Type 2 diabetes mellitus with proteinuria ([place]) 10/11/2014   Back pain 10/11/2014   History of colon cancer     Palliative Care Assessment & Plan   HPI: 81 y.o. male  with past medical history of CVA with right-sided deficits, CAD s/p CABG, CHF, CKD, PAD, A-fib, T2DM, HLD, HTN, COPD, and OSA admitted on 11/17/2021 with AMS.  Patient diagnosed with septic shock related to UTI.  He initially required vasopressors but is now off of them.  Patient also with acute kidney injury and hyperkalemia.  Baseline creatinine around 1.8 now 5-6.  Patient also with unstageable sacral pressure injury.  PMT consulted to discuss goals of care.  Assessment: Follow-up today with patient's stepdaughter Belinda.  We discussed updates on how patient is doing today -feeding himself, more alert, improving kidney function.  We review conversation from yesterday.  She has no questions or concerns from the conversation.  We review that patient's spouse/Belinda's mother Tandy Gaw has significant  cognitive impairment and [name] shares she is not able to independently make medical decisions for the patient.  We reviewed her CODE STATUS conversation and [name] is unsure how to proceed.  We discussed the patient appears to be stable at this point and it is okay to continue these conversations outpatient when patient's son is available.  She is already connected to outpatient palliative care.  [name] shares the patient does not have a HCPOA document though she does remember him intending to complete 1 at 1 time and name her and the patient's son as Geologist, engineering.  We discussed that without documents next of kin is typical decision maker and since spouse is unable to make decisions we would like to patient's children.  [name] is a Psychiatrist -she tells me patient has  2 sons however 1 is quite ill.  She provides phone number to get in touch with patient's son Duc, [place]. at [phone].  I did attempt to call patient's son Tye however he did not answer -voicemail left with callback number.  Recommendations/Plan: Patient improved from yesterday Ongoing goals of care discussions -stepdaughter [name] is already in touch with outpatient palliative care Unclear decision maker as wife is cognitively impaired and her daughter is the patient's stepdaughter but is also the most involved and connected -no HCPOA document Patient's biological son Kyree was called however no answer, voicemail with callback number left  Code Status: Full code  Discharge Planning: [place] for rehab with Palliative care service follow-up  Care plan was discussed with patient stepdaughter [name]  Thank you for allowing the Palliative Medicine Team to assist in the care of this patient.  *Please note that this is a verbal dictation therefore any spelling or grammatical errors are due to the "[place] One" system interpretation.  Juel Burrow, DNP, [facility] Palliative Medicine  Team Team Phone # [phone]  Pager [phone]

## 2021-11-20 NOTE — Evaluation (Signed)
Physical Therapy Evaluation Patient Details Name: Justin Griffith MRN: [id number] DOB: [birth year] Today's Date: 11/20/2021  History of Present Illness  Pt is an 81 y.o. male  with past medical history of CVA with right-sided deficits, CAD s/p CABG, CHF, CKD, PAD, A-fib, T2DM, HLD, HTN, COPD, recent NSTEMI, and OSA admitted on 11/17/2021 with AMS from SNF.  Patient diagnosed with septic shock related to UTI.  Patient also with unstageable sacral pressure injury.   Clinical Impression  Patient alert, oriented to self, "hospital", month/year. Disoriented to situation and displayed some difficulty with short term memory, but did follow all commands today. Did not recall that he was at SNF prior to admission, PLOF gathered from chart, see below.  The patient was able to perform supine to sit with cues for sequencing, maxAx2 for trunk elevation especially. Noted for strong R lateral lean throughout sitting despite repositioning, cueing, propping and time. Sit <> Stand three times with maxAx2. Pt still with very strong R lateral lean but decreased RLE stability as well. Unable to ambulate at this time.  Overall the patient demonstrated deficits (see "PT Problem List") that impede the patient's functional abilities, safety, and mobility and would benefit from skilled PT intervention. Recommendation is SNF due to current level of assistance needed and decline from PLOF.        Recommendations for follow up therapy are one component of a multi-disciplinary discharge planning process, led by the attending physician.  Recommendations may be updated based on patient status, additional functional criteria and insurance authorization.  Follow Up Recommendations Skilled nursing-short term rehab (<3 hours/day) Can patient physically be transported by private vehicle: No    Assistance Recommended at Discharge Frequent or constant Supervision/Assistance  Patient can return home with the following  Two people to help  with walking and/or transfers;Two people to help with bathing/dressing/bathroom;Help with stairs or ramp for entrance;Direct supervision/assist for medications management;Assist for transportation;Direct supervision/assist for financial management;Assistance with cooking/housework    Equipment Recommendations Other (comment) (TBD at next level of care)  Recommendations for Other Services       Functional Status Assessment Patient has had a recent decline in their functional status and demonstrates the ability to make significant improvements in function in a reasonable and predictable amount of time.     Precautions / Restrictions Precautions Precautions: Fall Restrictions Weight Bearing Restrictions: No      Mobility  Bed Mobility Overal bed mobility: Needs Assistance Bed Mobility: Supine to Sit, Sit to Supine     Supine to sit: Max assist, HOB elevated, +2 for physical assistance Sit to supine: Max assist, +2 for physical assistance        Transfers Overall transfer level: Needs assistance Equipment used: Rolling walker (2 wheels) Transfers: Sit to/from Stand Sit to Stand: Max assist, +2 physical assistance                Ambulation/Gait               General Gait Details: unable to ambulate at this time  Science writer    Modified Rankin (Stroke Patients Only)       Balance Overall balance assessment: Needs assistance Sitting-balance support: Feet supported, Bilateral upper extremity supported Sitting balance-Leahy Scale: Zero Sitting balance - Comments: R lateral lean, posterior lean throughout despite repositioniong, propping, education   Standing balance support: Bilateral upper extremity supported, Reliant on assistive device for balance, During functional activity  Standing balance-Leahy Scale: Zero                               Pertinent Vitals/Pain Pain Assessment Pain Assessment: No/denies pain     Home Living Family/patient expects to be discharged to:: Private residence (but per chart pt came from SNF) Living Arrangements: Spouse/significant other Available Help at Discharge: Family;Available 24 hours/day Type of Home: House Home Access: Stairs to enter Entrance Stairs-Rails: None Entrance Stairs-Number of Steps: 1   Home Layout: One level Home Equipment: Conservation officer, nature (2 wheels);Cane - single point;Grab bars - tub/shower Additional Comments: wife with dementia, requires cues for mobility and assist for bathing    Prior Function Prior Level of Function : Independent/Modified Independent;Patient poor historian/Family not available             Mobility Comments: reprts 1 fall per week 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     Hand Dominance   Dominant Hand: Right    Extremity/Trunk Assessment   Upper Extremity Assessment Upper Extremity Assessment: Generalized weakness    Lower Extremity Assessment Lower Extremity Assessment: Generalized weakness    Cervical / Trunk Assessment Cervical / Trunk Assessment: Kyphotic  Communication   Communication: HOH  Cognition Arousal/Alertness: Awake/alert Behavior During Therapy: Flat affect Overall Cognitive Status: Difficult to assess Area of Impairment: Memory, Following commands, Safety/judgement, Problem solving                     Memory: Decreased short-term memory Following Commands: Follows one step commands consistently Safety/Judgement: Decreased awareness of safety, Decreased awareness of deficits   Problem Solving: Slow processing, Decreased initiation, Difficulty sequencing, Requires verbal cues, Requires tactile cues General Comments: pt oriented to self, hospital, year, month. disoriented to situation, decreased short term memory        General Comments      Exercises     Assessment/Plan    PT Assessment Patient needs continued PT services  PT Problem List Decreased strength;Decreased  safety awareness;Decreased mobility;Decreased balance;Decreased range of motion;Decreased activity tolerance       PT Treatment Interventions DME instruction;Therapeutic exercise;Gait training;Balance training;Stair training;Neuromuscular re-education;Functional mobility training;Therapeutic activities;Patient/family education    PT Goals (Current goals can be found in the Care Plan section)  Acute Rehab PT Goals Patient Stated Goal: to get stronger PT Goal Formulation: With patient Time For Goal Achievement: 12/04/21 Potential to Achieve Goals: Fair    Frequency Min 2X/week     Co-evaluation               AM-PAC PT "6 Clicks" Mobility  Outcome Measure Help needed turning from your back to your side while in a flat bed without using bedrails?: Total Help needed moving from lying on your back to sitting on the side of a flat bed without using bedrails?: Total Help needed moving to and from a bed to a chair (including a wheelchair)?: Total Help needed standing up from a chair using your arms (e.g., wheelchair or bedside chair)?: Total Help needed to walk in hospital room?: Total Help needed climbing 3-5 steps with a railing? : Total 6 Click Score: 6    End of Session   Activity Tolerance: Patient tolerated treatment well Patient left: in bed;with bed alarm set;with call bell/phone within reach Nurse Communication: Mobility status PT Visit Diagnosis: Difficulty in walking, not elsewhere classified (R26.2);Other abnormalities of gait and mobility (R26.89);History of falling (Z91.81);Muscle weakness (generalized) (M62.81);Unsteadiness on feet (R26.81)  Time: 1105-1120 PT Time Calculation (min) (ACUTE ONLY): 15 min   Charges:   PT Evaluation $PT Eval Low Complexity: 1 Low PT Treatments $Therapeutic Activity: 8-22 mins        Lieutenant Diego PT, DPT 1:39 PM,11/20/21

## 2021-11-20 NOTE — Consult Note (Signed)
ANTICOAGULATION CONSULT NOTE - Initial Consult  Pharmacy Consult for transition from heparin to Eliquis Indication: atrial fibrillation  Allergies  Allergen Reactions   Farxiga [Dapagliflozin] Rash    Causes a severe rash in groin area    Dulaglutide Diarrhea and Other (See Comments)   Liraglutide Diarrhea and Other (See Comments)   Jardiance [Empagliflozin] Rash    Patient Measurements: Height: '5\' 11"'$  (180.3 cm) Weight: 110.4 kg (243 lb 6.2 oz) IBW/kg (Calculated) : 75.3  Vital Signs: Temp: 97.8 F (36.6 C) (08/31 0806) Temp Source: Oral (08/31 0806) BP: 122/51 (08/31 0806) Pulse Rate: 57 (08/31 0806)  Labs: Recent Labs    11/17/21 2142 11/17/21 2142 11/17/21 2358 11/18/21 0329 11/18/21 1250 11/18/21 1756 11/19/21 0147 11/20/21 0439  HGB 12.5*  --   --  10.3*  --   --  10.0* 9.9*  HCT 39.9  --   --  32.9*  --   --  31.1* 30.3*  PLT 280  --   --  244  --   --  258 249  APTT 44*  --   --   --   --  83* 68* 70*  LABPROT 30.3*  --   --  27.9*  --   --   --   --   INR 2.9*  --   --  2.6*  --   --   --   --   HEPARINUNFRC  --    < >  --  >1.10*  --  >1.10* >1.10* >1.10*  CREATININE 6.39*  --  6.50* 5.91*   < > 5.60* 5.60* 3.58*  TROPONINIHS 24*  --  22*  --   --   --   --   --    < > = values in this interval not displayed.    Estimated Creatinine Clearance: 20.4 mL/min (A) (by C-G formula based on SCr of 3.58 mg/dL (H)).   Medical History: Past Medical History:  Diagnosis Date   Acute urinary retention    Arthritis    Asthma 2000   Back pain    Colon polyp 2012   COPD (chronic obstructive pulmonary disease) (HCC)    Emphysema of lung (HCC)    Heart murmur    Hernia 2000   Hyperlipidemia    Hypertension    Personal history of malignant neoplasm of large intestine    Personal history of tobacco use, presenting hazards to health    Sleep apnea    Special screening for malignant neoplasms, colon    Type 2 diabetes mellitus with proteinuria ([place])  10/11/2014    Medications:  Apixaban 2.5 mg PO BID  Assessment: Pt is a 81 yo M with PMH CVA rt-side deficits, CAD (CABG), HFpEF, CKD 3b (BL Scr 1.5-1.8), PAD, Afib (on eliquis PTA), T2DM, HLD, HTN, COPD (3-4L [place]), OSA, astmha, colon CA, urinary retention with chronic foley. Pt presented 3 days ago with septic shock 2/2 Enterococcus faecalis UTI. Pt has improved, now off pressors, afebrile and VSS. Pt had been switched from Eliquis to heparin while inpatient for peri-op management of anticoagulation due to plans for surgical debridement of sacral wound. Surgery was consulted and found no present indication for surgical intervention, noting no necrosis or abscess in addition to WBCs being now WNL. No current plans for surgical intervention. At this time, appropriate to transition back from heparin to PTA Eliquis for Afib. Pt is >80 yo and Scr is currently 3.58, up from BL Scr 1.5-1.8.  Plan:  Discontinue heparin drip Restart apixaban 2.5 mg PO BID for Afib (dose reduced due to age and Scr) Monitor CBC at least weekly while on Eliquis  Dara Hoyer, PharmD PGY-1 Pharmacy Resident 11/20/2021 3:24 PM

## 2021-11-20 NOTE — Progress Notes (Signed)
[facility]  ROUNDING NOTE   Subjective:   Patient resting in bed Alert and oriented Denies pain and discomfort  Creatinine 3.58 UOP 2.3L  Objective:  Vital signs in last 24 hours:  Temp:  [97.5 F (36.4 C)-99 F (37.2 C)] 97.8 F (36.6 C) (08/31 0806) Pulse Rate:  [57-100] 57 (08/31 0806) Resp:  [11-21] 20 (08/31 0806) BP: (89-149)/(44-82) 122/51 (08/31 0806) SpO2:  [94 %-100 %] 100 % (08/31 0806)  Weight change:  Filed Weights   11/17/21 2152 11/18/21 0400 11/19/21 0500  Weight: 106.9 kg 109.5 kg 110.4 kg    Intake/Output: I/O last 3 completed shifts: In: 1963 [P.O.:1200; I.V.:632.8; IV Piggyback:130.2] Out: 3000 [Urine:3000]   Intake/Output this shift:  No intake/output data recorded.  Physical Exam: General: NAD,   Head: Normocephalic, atraumatic. Moist oral mucosal membranes  Eyes: Anicteric  Lungs:  Clear to auscultation, normal effort  Heart: Regular rate and rhythm  Abdomen:  Soft, nontender  Extremities:  1+ peripheral edema.  Neurologic: Nonfocal, moving all four extremities  Skin: No lesions  Access: None    Basic Metabolic Panel: Recent Labs  Lab 11/18/21 0329 11/18/21 1250 11/18/21 1756 11/19/21 0147 11/20/21 0439  NA 135 136 136 138 140  K 5.9* 5.6* 5.2* 5.4* 4.7  CL 103 106 102 106 106  CO2 '23 23 23 24 26  '$ GLUCOSE 134* 87 142* 100* 130*  BUN 109* 104* 104* 99* 71*  CREATININE 5.91* 5.81* 5.60* 5.60* 3.58*  CALCIUM 9.1 9.2 9.2 9.2 9.2  MG 2.3  --   --  2.3 2.1  PHOS 4.7*  --   --  3.7 2.6    Liver Function Tests: Recent Labs  Lab 11/17/21 2142  AST 26  ALT 13  ALKPHOS 39  BILITOT 0.5  PROT 6.9  ALBUMIN 3.0*   No results for input(s): "LIPASE", "AMYLASE" in the last 168 hours. No results for input(s): "AMMONIA" in the last 168 hours.  CBC: Recent Labs  Lab 11/17/21 2142 11/18/21 0329 11/19/21 0147 11/20/21 0439  WBC 14.2* 12.7* 8.9 6.6  NEUTROABS 11.0*  --  6.2 4.3  HGB 12.5* 10.3* 10.0* 9.9*  HCT  39.9 32.9* 31.1* 30.3*  MCV 93.7 92.4 90.4 90.7  PLT 280 244 258 249    Cardiac Enzymes: No results for input(s): "CKTOTAL", "CKMB", "CKMBINDEX", "TROPONINI" in the last 168 hours.  BNP: Invalid input(s): "POCBNP"  CBG: Recent Labs  Lab 11/19/21 2031 11/19/21 2119 11/19/21 2328 11/20/21 0806 11/20/21 1143  GLUCAP 129* 119* 125* 116* 162*    Microbiology: Results for orders placed or performed during the hospital encounter of 11/17/21  Blood Culture (routine x 2)     Status: None (Preliminary result)   Collection Time: 11/17/21  9:42 PM   Specimen: BLOOD  Result Value Ref Range Status   Specimen Description BLOOD LEFT [facility]  Final   Special Requests   Final    BOTTLES DRAWN AEROBIC AND ANAEROBIC Blood Culture adequate volume   Culture   Final    NO GROWTH 3 DAYS Performed at [facility], [address], [place], [place] [address]    Report Status PENDING  Incomplete  Urine Culture     Status: Abnormal (Preliminary result)   Collection Time: 11/17/21  9:42 PM   Specimen: In/Out Cath Urine  Result Value Ref Range Status   Specimen Description   Final    IN/OUT CATH URINE Performed at [facility], [address] [place],  [address]  Special Requests   Final    NONE Performed at [facility], [address] [place], [place] [address]    Culture (A)  Final    >=100,000 COLONIES/mL ENTEROCOCCUS FAECALIS 50,000 COLONIES/mL STAPHYLOCOCCUS HAEMOLYTICUS SUSCEPTIBILITIES TO FOLLOW Performed at [facility], [place] [address], [place], [place] [address]    Report Status PENDING  Incomplete   Organism ID, Bacteria ENTEROCOCCUS FAECALIS (A)  Final      Susceptibility   Enterococcus faecalis - MIC*    AMPICILLIN <=2 SENSITIVE Sensitive     NITROFURANTOIN <=16 SENSITIVE Sensitive     VANCOMYCIN 1 SENSITIVE Sensitive     * >=100,000 COLONIES/mL ENTEROCOCCUS FAECALIS  Blood Culture (routine x 2)     Status: None (Preliminary  result)   Collection Time: 11/17/21  9:59 PM   Specimen: BLOOD  Result Value Ref Range Status   Specimen Description BLOOD RIGHT FA  Final   Special Requests   Final    BOTTLES DRAWN AEROBIC AND ANAEROBIC Blood Culture adequate volume   Culture   Final    NO GROWTH 3 DAYS Performed at [facility], [address] [place], [place] [address]    Report Status PENDING  Incomplete  MRSA Next Gen by PCR, Nasal     Status: None   Collection Time: 11/18/21  4:49 AM   Specimen: Nasal Mucosa; Nasal Swab  Result Value Ref Range Status   MRSA by PCR Next Gen NOT DETECTED NOT DETECTED Final    Comment: (NOTE) The GeneXpert MRSA Assay (FDA approved for NASAL specimens only), is one component of a comprehensive MRSA colonization surveillance program. It is not intended to diagnose MRSA infection nor to guide or monitor treatment for MRSA infections. Test performance is not FDA approved in patients less than 64 years old. Performed at [facility], [address] [place], [place] [address]     Coagulation Studies: Recent Labs    11/17/21 2140-06-22 11/18/21 0329  LABPROT 30.3* 27.9*  INR 2.9* 2.6*    Urinalysis: Recent Labs    11/17/21 2142  COLORURINE YELLOW*  LABSPEC 1.024  PHURINE 5.0  GLUCOSEU 50*  HGBUR LARGE*  BILIRUBINUR NEGATIVE  KETONESUR NEGATIVE  PROTEINUR >=300*  NITRITE NEGATIVE  LEUKOCYTESUR MODERATE*      Imaging: No results found.   Medications:    sodium chloride Stopped (11/19/21 1451)    alum & mag hydroxide-simeth  30 mL Oral TID AC & HS   amoxicillin  500 mg Oral Q12H   apixaban  2.5 mg Oral BID   vitamin C  500 mg Oral BID   Chlorhexidine Gluconate Cloth  6 each Topical Daily   cholecalciferol  2,000 Units Oral QHS   cyanocobalamin  1,000 mcg Oral QHS   feeding supplement  237 mL Oral TID BM   insulin aspart  0-9 Units Subcutaneous TID WC   ipratropium-albuterol  3 mL Nebulization TID   lidocaine  1 patch Transdermal Q24H    magnesium oxide  400 mg Oral Daily   midodrine  5 mg Oral TID WC   multivitamin with minerals  1 tablet Oral Daily   pantoprazole  40 mg Oral Daily   rosuvastatin  10 mg Oral QHS   acetaminophen **OR** acetaminophen, ondansetron **OR** ondansetron (ZOFRAN) IV  Assessment/ Plan:  [name] is a 81 y.o.  male with past medical conditions including COPD, hypertension, dyslipidemia, on Eliquis for PE, diabetes, diastolic heart failure with EF 60 to 65%,  and chronic kidney disease stage IIIb, who was admitted to [facility] on 11/17/2021 for Hyperkalemia [E87.5] Septic shock ([place]) [A41.9, R65.21] Sepsis due to pneumonia ([place]) [J18.9, A41.9] Sepsis, due to unspecified organism, unspecified whether acute organ dysfunction present ([place]) [A41.9]   Acute kidney injury with hyperkalemia on chronic kidney disease stage IIIb.  Baseline creatinine appears to be 1.86 with GFR 36 on 11/02/2021.  Acute kidney injury appears secondary to severe illness, UTI, and pneumonia.  Urine culture positive for Enterococcus.  No IV contrast exposure.  Renal ultrasound negative for hydronephrosis.    Creatinine improved with adequate UOP. Continue treatment of underlying cause. Will monitor. Continue to avoid nephrotoxic agents and therapies.   Lab Results  Component Value Date   CREATININE 3.58 (H) 11/20/2021   CREATININE 5.60 (H) 11/19/2021   CREATININE 5.60 (H) 11/18/2021    Intake/Output Summary (Last 24 hours) at 11/20/2021 1248 Last data filed at 11/20/2021 0520 Gross per 24 hour  Intake 577.33 ml  Output 1900 ml  Net -1322.67 ml   2. Anemia of chronic kidney disease  Lab Results  Component Value Date   HGB 9.9 (L) 11/20/2021    Hgb at goal  3.  Hypertension with chronic kidney disease.  Home regimen includes carvedilol, clonidine, furosemide, and hydralazine. Receiving midodrine 10 mg 3 times daily.   4. Diabetes mellitus type II with chronic kidney disease: insulin dependent. Home regimen  includes glargine. Most recent hemoglobin A1c is 8.1 on 10/30/2021.  Metformin currently held.    LOS: 3   8/31/202312:48 PM

## 2021-11-20 NOTE — NC FL2 (Addendum)
[place] LEVEL OF CARE SCREENING TOOL     IDENTIFICATION  Patient Name: Justin Griffith Birthdate: December 28, 1940 Sex: male Admission Date (Current Location): 11/17/2021  [place] and [place] Number:  Engineering geologist and Address:  [facility], [address] [place], [place] [address]      Provider Number: [id number]  Attending Physician Name and Address:  Loletha Grayer, MD  Relative Name and Phone Number:  Juluis Rainier [phone]    Current Level of Care: Hospital Recommended Level of Care: [place] Prior Approval Number:    Date Approved/Denied:   PASRR Number: [id number]  Discharge Plan: SNF    Current Diagnoses: Patient Active Problem List   Diagnosis Date Noted   Pressure injury of sacral region, unstageable [facility])    Septic shock ([place]) 11/18/2021   Lobar pneumonia ([place]) 11/18/2021   Acute kidney injury superimposed on chronic kidney disease ([place]) 11/18/2021   Acute lower UTI 11/18/2021   Type 2 diabetes mellitus with chronic kidney disease, without long-term current use of insulin ([place]) 11/18/2021   Hyperkalemia    Recurrent falls 10/31/2021   Stroke ([place]) 10/31/2021   HLD (hyperlipidemia) 10/31/2021   Type II diabetes mellitus with renal manifestations ([place]) 10/31/2021   CAD (coronary artery disease) 10/31/2021   Chronic diastolic CHF (congestive heart failure) ([place]) 10/31/2021   Right elbow pain 10/15/2021   Gross hematuria 10/12/2021   Chronic anticoagulation 10/12/2021   Stage 3b chronic kidney disease (CKD) ([place]) 10/12/2021   History of pulmonary embolism 09/18/21 10/06/2021   Essential hypertension 09/19/2021   Dyslipidemia 09/19/2021   Generalized weakness 06/25/2021   Lymphedema 03/17/2021   Headache disorder 02/27/2021   Falls frequently 11/28/2020   Aortic atherosclerosis ([place]) 06/12/2020   History of embolic stroke 84/69/6295   At high risk for falls 05/08/2020   History of cold  sores 05/06/2020   History of non-ST elevation myocardial infarction (NSTEMI) 03/28/2020   Elevated TSH 02/25/2020   Poor mobility 02/02/2020   Coronary artery disease 01/23/2020   History of CVA (cerebrovascular accident) 01/23/2020   Memory changes 01/01/2020   Chronic venous insufficiency 05/23/2019   B12 deficiency 04/23/2019   Constipation 04/21/2019   Tremor 11/14/2018   Obesity (BMI 30-39.9) 09/07/2017   Chronic obstructive pulmonary disease (COPD) ([place]) 04/29/2017   Intertrigo 02/02/2017   BPH with obstruction/lower urinary tract symptoms Foley dependent 07/23/2016   Advanced care planning/counseling discussion 28/41/3244   Eosinophilic esophagitis 03/25/7251   PAD (peripheral artery disease) ([place]) 01/08/2016   GERD without esophagitis 01/06/2016   Hyperlipidemia associated with type 2 diabetes mellitus ([place]) 01/16/2015   Sleep apnea 01/16/2015   BMI 40.0-44.9, adult ([place]) 01/16/2015   Hypertension associated with diabetes ([place]) 10/11/2014   Type 2 diabetes mellitus with proteinuria ([place]) 10/11/2014   Back pain 10/11/2014   History of colon cancer     Orientation RESPIRATION BLADDER Height & Weight     Self, Time, Situation, Place  Normal Continent Weight: 110.4 kg Height:  '5\' 11"'$  (180.3 cm)  BEHAVIORAL SYMPTOMS/MOOD NEUROLOGICAL BOWEL NUTRITION STATUS      Continent Diet (See DC summary)  AMBULATORY STATUS COMMUNICATION OF NEEDS Skin   Extensive Assist Verbally Normal                       Personal Care Assistance Level of Assistance  Bathing, Feeding, Dressing Bathing Assistance: Maximum assistance Feeding assistance: Independent Dressing Assistance: Maximum assistance     Functional Limitations Info  Sight, Hearing, Speech Sight Info: Adequate Hearing Info: Impaired Speech Info: Impaired    SPECIAL CARE FACTORS FREQUENCY  PT (By licensed PT), OT (By licensed OT)     PT Frequency: 5 times per week OT Frequency: 5 times per week             Contractures Contractures Info: Not present    Additional Factors   Code Status DNR Allergies Farxiga [Dapagliflozin], Dulaglutide, Liraglutide, Jardiance [Empagliflozin]   Current Medications (11/20/2021):  This is the current hospital active medication list Current Facility-Administered Medications  Medication Dose Route Frequency Provider Last Rate Last Admin   0.9 %  sodium chloride infusion  250 mL Intravenous Continuous Rust-Chester, Huel Cote, NP   Stopped at 11/19/21 1451   acetaminophen (TYLENOL) tablet 650 mg  650 mg Oral Q6H PRN Mansy, Jan A, MD   650 mg at 11/20/21 0548   Or   acetaminophen (TYLENOL) suppository 650 mg  650 mg Rectal Q6H PRN Mansy, Jan A, MD       alum & mag hydroxide-simeth (MAALOX/MYLANTA) 200-200-20 MG/5ML suspension 30 mL  30 mL Oral TID AC & HS Mansy, Jan A, MD   30 mL at 11/20/21 1143   amoxicillin (AMOXIL) capsule 500 mg  500 mg Oral Q12H Wieting, Richard, MD   500 mg at 11/20/21 1316   apixaban (ELIQUIS) tablet 2.5 mg  2.5 mg Oral BID Delena Bali, RPH   2.5 mg at 11/20/21 1100   ascorbic acid (VITAMIN C) tablet 500 mg  500 mg Oral BID Flora Lipps, MD   500 mg at 11/20/21 0900   Chlorhexidine Gluconate Cloth 2 % PADS 6 each  6 each Topical Daily Flora Lipps, MD   6 each at 11/20/21 0901   cholecalciferol (VITAMIN D3) 25 MCG (1000 UNIT) tablet 2,000 Units  2,000 Units Oral QHS Flora Lipps, MD   2,000 Units at 11/19/21 2205   cyanocobalamin (VITAMIN B12) tablet 1,000 mcg  1,000 mcg Oral QHS Mansy, Jan A, MD   1,000 mcg at 11/19/21 2206   feeding supplement (ENSURE ENLIVE / ENSURE PLUS) liquid 237 mL  237 mL Oral TID BM Flora Lipps, MD   237 mL at 11/20/21 1305   insulin aspart (novoLOG) injection 0-9 Units  0-9 Units Subcutaneous TID WC Wieting, Richard, MD   2 Units at 11/20/21 1146   ipratropium-albuterol (DUONEB) 0.5-2.5 (3) MG/3ML nebulizer solution 3 mL  3 mL Nebulization Q6H PRN Loletha Grayer, MD       lidocaine (LIDODERM) 5 % 1  patch  1 patch Transdermal Q24H Mansy, Jan A, MD   1 patch at 11/20/21 0901   magnesium oxide (MAG-OX) tablet 400 mg  400 mg Oral Daily Mansy, Jan A, MD   400 mg at 11/20/21 0901   midodrine (PROAMATINE) tablet 2.5 mg  2.5 mg Oral TID WC Wieting, Richard, MD       multivitamin with minerals tablet 1 tablet  1 tablet Oral Daily Flora Lipps, MD   1 tablet at 11/20/21 0901   ondansetron (ZOFRAN) tablet 4 mg  4 mg Oral Q6H PRN Mansy, Jan A, MD       Or   ondansetron [facility]) injection 4 mg  4 mg Intravenous Q6H PRN Mansy, Jan A, MD       pantoprazole (PROTONIX) EC tablet 40 mg  40 mg Oral Daily Wieting, Richard, MD   40 mg at 11/20/21 0900   rosuvastatin (CRESTOR) tablet 10 mg  10 mg Oral QHS  Lorna Dibble, RPH   10 mg at 11/19/21 2206     Discharge Medications: Please see discharge summary for a list of discharge medications.  Relevant Imaging Results:  Relevant Lab Results:   Additional Information SSN [id number]  Conception Oms, RN

## 2021-11-20 NOTE — TOC Progression Note (Signed)
Transition of Care [facility]) - Progression Note    Patient Details  Name: KEYAAN LEDERMAN MRN: [id number] Date of Birth: 1940/04/04  Transition of Care [facility]) CM/SW [place], RN Phone Number: 11/20/2021, 4:13 PM  Clinical Narrative:     Per Palliative notes the patient does not have a HCPOA document    without documents next of kin is typical decision maker and since spouse is unable to make decisions we would look to patient's children.  [name] is a Psychiatrist -  patient has 2 sons however 1 is quite ill.  [name] provides phone number to get in touch with patient's son Samik, [place]. at [phone].    I attempted to call Loni and left a general Voice Mail asking for a call back and provided with my contact Information  Expected Discharge Plan and Services                                                 Social Determinants of Health (SDOH) Interventions    Readmission Risk Interventions    09/21/2021   10:42 AM 05/21/2020   10:01 AM 05/18/2020   12:17 PM  Readmission Risk Prevention Plan  Transportation Screening Complete Complete Complete  PCP or Specialist Appt within 3-5 Days Complete Complete   HRI or Home Care Consult Complete Complete Complete  Social Work Consult for [place] Planning/Counseling Complete Complete Complete  Palliative Care Screening Not Applicable Not Applicable Not Applicable  Medication Review Press photographer) Complete Complete Complete

## 2021-11-20 NOTE — Progress Notes (Signed)
ANTICOAGULATION CONSULT NOTE - Initial Consult  Pharmacy Consult for Heparin  Indication: atrial fibrillation  Allergies  Allergen Reactions   Farxiga [Dapagliflozin] Rash    Causes a severe rash in groin area    Dulaglutide Diarrhea and Other (See Comments)   Liraglutide Diarrhea and Other (See Comments)   Jardiance [Empagliflozin] Rash    Patient Measurements: Height: '5\' 11"'$  (180.3 cm) Weight: 110.4 kg (243 lb 6.2 oz) IBW/kg (Calculated) : 75.3 Heparin Dosing Weight:  98.7 kg   Vital Signs: Temp: 97.5 F (36.4 C) (08/30 2314) BP: 149/67 (08/30 2314) Pulse Rate: 67 (08/30 2314)  Labs: Recent Labs    11/17/21 2142 11/17/21 2142 11/17/21 2358 11/18/21 0329 11/18/21 1250 11/18/21 1756 11/19/21 0147 11/20/21 0439  HGB 12.5*  --   --  10.3*  --   --  10.0* 9.9*  HCT 39.9  --   --  32.9*  --   --  31.1* 30.3*  PLT 280  --   --  244  --   --  258 249  APTT 44*  --   --   --   --  83* 68* 70*  LABPROT 30.3*  --   --  27.9*  --   --   --   --   INR 2.9*  --   --  2.6*  --   --   --   --   HEPARINUNFRC  --    < >  --  >1.10*  --  >1.10* >1.10* >1.10*  CREATININE 6.39*  --  6.50* 5.91*   < > 5.60* 5.60* 3.58*  TROPONINIHS 24*  --  22*  --   --   --   --   --    < > = values in this interval not displayed.     Estimated Creatinine Clearance: 20.4 mL/min (A) (by C-G formula based on SCr of 3.58 mg/dL (H)).   Medical History: Past Medical History:  Diagnosis Date   Acute urinary retention    Arthritis    Asthma 2000   Back pain    Colon polyp 2012   COPD (chronic obstructive pulmonary disease) (HCC)    Emphysema of lung (HCC)    Heart murmur    Hernia 2000   Hyperlipidemia    Hypertension    Personal history of malignant neoplasm of large intestine    Personal history of tobacco use, presenting hazards to health    Sleep apnea    Special screening for malignant neoplasms, colon    Type 2 diabetes mellitus with proteinuria ([place]) 10/11/2014    Medications:   Medications Prior to Admission  Medication Sig Dispense Refill Last Dose   acetaminophen (TYLENOL) 325 MG tablet Take 650 mg by mouth every 6 (six) hours as needed.   prn at prn   aluminum-magnesium hydroxide-simethicone (MAALOX) 299-242-68 MG/5ML SUSP Take 30 mLs by mouth 4 (four) times daily -  before meals and at bedtime.   prn at prn   apixaban (ELIQUIS) 5 MG TABS tablet Take 1 tablet (5 mg total) by mouth 2 (two) times daily. 60 tablet 1 11/17/2021 at [place] 160-9-4.8 MCG/ACT AERO Inhale 1 puff into the lungs daily.   11/17/2021 at 0800   carvedilol (COREG) 3.125 MG tablet Take 1 tablet (3.125 mg total) by mouth 2 (two) times daily with a meal. 60 tablet 1 11/17/2021 at 1930   cephALEXin (KEFLEX) 500 MG capsule Take 500 mg by mouth 3 (three)  times daily.   11/17/2021   cloNIDine (CATAPRES) 0.1 MG tablet Take 0.1 mg by mouth 2 (two) times daily.   11/17/2021 at 0800   cyanocobalamin 1000 MCG tablet Take 1 tablet (1,000 mcg total) by mouth at bedtime. 30 tablet 1 11/17/2021 at 1930   fenofibrate (TRICOR) 145 MG tablet Take 1 tablet (145 mg total) by mouth daily. 90 tablet 1 11/17/2021 at 0800   furosemide (LASIX) 40 MG tablet Take 40 mg by mouth at bedtime.   11/17/2021 at 2000   gabapentin (NEURONTIN) 600 MG tablet Take 600 mg by mouth 2 (two) times daily.   11/17/2021 at 1930   hydrALAZINE (APRESOLINE) 50 MG tablet TAKE ONE TABLET BY MOUTH THREE TIMES A DAY FOR BLOOD PRESSURE   11/17/2021   insulin glargine-yfgn (SEMGLEE) 100 UNIT/ML Pen INJECT 10 UNITS UNDER SKIN ONCE EVERY DAY FOR DIABETES DISCARD PEN 28 DAYS AFTER OPENING. * GIVE AT THE SAME TIME DAILY *   11/17/2021 at 2000   lidocaine (LIDODERM) 5 % Place 1 patch onto the skin daily. Remove & Discard patch within 12 hours or as directed by MD 30 patch 0 11/17/2021   losartan (COZAAR) 50 MG tablet Take 1 tablet (50 mg total) by mouth daily. 30 tablet 1 11/17/2021 at 0800   magnesium oxide (MAG-OX) 400 (240 Mg) MG tablet Take 1  tablet (400 mg total) by mouth daily. 90 tablet 0 11/17/2021 at 0800   metFORMIN (GLUCOPHAGE) 1000 MG tablet Take 1 tablet (1,000 mg total) by mouth 2 (two) times daily with a meal. 180 tablet 3 11/17/2021 at 1700   mirabegron ER (MYRBETRIQ) 50 MG TB24 tablet Take 1 tablet (50 mg total) by mouth daily. 28 tablet 0 11/17/2021 at 0800   nortriptyline (PAMELOR) 10 MG capsule Take 10 mg by mouth 2 (two) times daily.   11/17/2021 at 1930   ondansetron (ZOFRAN) 4 MG tablet Take 4 mg by mouth every 8 (eight) hours as needed for nausea or vomiting.   prn at prn   pantoprazole (PROTONIX) 40 MG tablet Take 40 mg by mouth daily.   11/17/2021 at 0800   rosuvastatin (CRESTOR) 40 MG tablet Take 1 tablet (40 mg total) by mouth at bedtime. 90 tablet 3 11/17/2021 at 1930   Tiotropium Bromide Monohydrate (SPIRIVA RESPIMAT) 2.5 MCG/ACT AERS Inhale 2 puffs into the lungs daily. 1 g 3 11/17/2021 at 0800   Vitamin D, Cholecalciferol, 25 MCG (1000 UT) CAPS Take 2,000 mcg by mouth at bedtime.   11/17/2021 at 2000    Assessment: Pharmacy consulted to dose heparin in this 81 year old male admitted with septic shock.  On Eliquis 5 mg PO BID at home, last dose on 8/28 @ 1930.   CrCl = 12.6 ml/min   Goal of Therapy:  Heparin level 0.3-0.7 units/ml aPTT 66 - 102 seconds Monitor platelets by anticoagulation protocol: Yes   Plan:  8/31 @ 0439:  aPTT = 70,  HL = > 1.10 aPTT therapeutic X 3 but HL remains elevated.  Will continue to use aPTT to guide dosing. Will continue pt on current rate and recheck aPTT and HL on 09/01 with AM labs.   Cordarryl Monrreal D 11/20/2021,5:30 AM

## 2021-11-20 NOTE — Care Management Important Message (Signed)
Important Message  Patient Details  Name: Justin Griffith MRN: [id number] Date of Birth: 1941/01/18   Medicare Important Message Given:  N/A - LOS <3 / Initial given by admissions     Juliann Pulse A Panda Crossin 11/20/2021, 8:48 AM

## 2021-11-20 NOTE — Plan of Care (Signed)

## 2021-11-20 NOTE — Evaluation (Signed)
Occupational Therapy Evaluation Patient Details Name: Justin Griffith MRN: [id number] DOB: [birth year] Today's Date: 11/20/2021   History of Present Illness Pt is an 81 y.o. male  with past medical history of CVA with right-sided deficits, CAD s/p CABG, CHF, CKD, PAD, A-fib, T2DM, HLD, HTN, COPD, recent NSTEMI, and OSA admitted on 11/17/2021 with AMS from SNF.  Patient diagnosed with septic shock related to UTI.  Patient also with unstageable sacral pressure injury.   Clinical Impression  Pt presents with decreased independence in self-care, balance, safety, balance, and functional mobility. Pt reports he's retired living in 1 story house with his wife and dog ginger. Pt was independent with all self-care task and driving. Patient currently functioning at Max A with use of RW with transfers. Pt reports he used [facility] and RW in the community as needed. Pt required Max A to transfer from supine <> sit. When sitting EOB pt observed to have right lateral lean and upon standing requiring +2 assistance. Communication with RN about air mattress for skin integrity. Patient will benefit from acute OT to increase overall independence in the areas of ADLs, functional mobility, and safety awareness in order to safely discharge to next venue of care.     Recommendations for follow up therapy are one component of a multi-disciplinary discharge planning process, led by the attending physician.  Recommendations may be updated based on patient status, additional functional criteria and insurance authorization.   Follow Up Recommendations  Skilled nursing-short term rehab (<3 hours/day)    Assistance Recommended at Discharge Frequent or constant Supervision/Assistance  Patient can return home with the following Two people to help with walking and/or transfers;Two people to help with bathing/dressing/bathroom;Assistance with cooking/housework;Assist for transportation;Help with stairs or ramp for entrance    Functional  Status Assessment  Patient has had a recent decline in their functional status and demonstrates the ability to make significant improvements in function in a reasonable and predictable amount of time.  Equipment Recommendations  Other (comment) (Defer to next venue of care.)       Precautions / Restrictions Precautions Precautions: Fall Restrictions Weight Bearing Restrictions: No      Mobility Bed Mobility Overal bed mobility: Needs Assistance Bed Mobility: Supine to Sit, Sit to Supine     Supine to sit: +2 for physical assistance, Max assist Sit to supine: Max assist, +2 for physical assistance        Transfers Overall transfer level: Needs assistance Equipment used: Rolling walker (2 wheels) Transfers: Sit to/from Stand Sit to Stand: Max assist, +2 physical assistance                  Balance Overall balance assessment: Needs assistance Sitting-balance support: Feet supported, Bilateral upper extremity supported Sitting balance-Leahy Scale: Zero     Standing balance support: Bilateral upper extremity supported, Reliant on assistive device for balance Standing balance-Leahy Scale: Zero                             ADL either performed or assessed with clinical judgement   ADL Overall ADL's : Needs assistance/impaired                                       General ADL Comments: OT anticipates max A with ADLs     Vision Baseline Vision/History: 0 No visual deficits Patient Visual Report:  No change from baseline Vision Assessment?: No apparent visual deficits            Pertinent Vitals/Pain Pain Assessment Pain Assessment: No/denies pain Pain Descriptors / Indicators: Discomfort, Grimacing Pain Intervention(s): Limited activity within patient's tolerance, Monitored during session     Hand Dominance Right   Extremity/Trunk Assessment Upper Extremity Assessment Upper Extremity Assessment: Generalized weakness   Lower  Extremity Assessment Lower Extremity Assessment: Generalized weakness   Cervical / Trunk Assessment Cervical / Trunk Assessment: Kyphotic   Communication Communication Communication: HOH   Cognition Arousal/Alertness: Awake/alert Behavior During Therapy: WFL for tasks assessed/performed Overall Cognitive Status: Within Functional Limits for tasks assessed Area of Impairment: Memory, Safety/judgement, Awareness                     Memory: Decreased short-term memory Following Commands: Follows one step commands consistently Safety/Judgement: Decreased awareness of safety   Problem Solving: Slow processing, Requires verbal cues, Requires tactile cues, Decreased initiation, Difficulty sequencing                  Home Living Family/patient expects to be discharged to:: Private residence Living Arrangements: Spouse/significant other Available Help at Discharge: Family;Available 24 hours/day Type of Home: House Home Access: Stairs to enter [employer] of Steps: 2 Entrance Stairs-Rails: None Home Layout: One level     Bathroom Shower/Tub: Occupational psychologist: Standard     Home Equipment: Conservation officer, nature (2 wheels);Cane - single point   Additional Comments: wife with dementia, requires cues for mobility and assist for bathing      Prior Functioning/Environment Prior Level of Function : Independent/Modified Independent             Mobility Comments: reprts 1 fall per week 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 OT Goal Formulation: With patient Time For Goal Achievement: 12/04/21 Potential to Achieve Goals: Fair ADL Goals Pt Will Perform Lower Body Bathing: with mod assist;with adaptive equipment;sitting/lateral leans Pt Will Perform Lower Body Dressing: with mod assist;with adaptive equipment Pt Will Transfer to Toilet: with mod assist;bedside commode Pt Will Perform Toileting - Clothing Manipulation and hygiene: with mod assist;with adaptive equipment  OT Frequency: Min 2X/week       AM-PAC OT "6 Clicks" Daily Activity     Outcome Measure Help from another person eating meals?: A Little Help from another person taking care of personal grooming?: A Lot Help from another person toileting, which includes using toliet, bedpan, or urinal?: A Lot Help from another person bathing (including washing, rinsing, drying)?: A Lot Help from another person to put on and taking off regular upper body clothing?: A Little Help from another person to put on and taking off regular lower body clothing?: A Lot 6 Click Score: 14   End of Session Equipment Utilized During Treatment: Rolling walker (2 wheels)  Activity Tolerance: Patient limited by fatigue Patient left: in bed;with call bell/phone within reach;with bed alarm set  OT Visit Diagnosis: Unsteadiness on feet (R26.81);Other abnormalities of gait and mobility (R26.89);Muscle weakness (generalized) (M62.81);History of falling (Z91.81);Pain  Time: 2010-0712 OT Time Calculation (min): 32 min Charges:  OT General Charges $OT Visit: (P) 1 Visit OT Evaluation $OT Eval Moderate Complexity: (P) 1 Mod OT Treatments $Therapeutic Activity: (P) 8-22 mins    Halee Glynn, OTS 11/20/2021, 3:31 PM

## 2021-11-21 DIAGNOSIS — N390 Urinary tract infection, site not specified: Secondary | ICD-10-CM | POA: Diagnosis not present

## 2021-11-21 DIAGNOSIS — Z7189 Other specified counseling: Secondary | ICD-10-CM | POA: Diagnosis not present

## 2021-11-21 DIAGNOSIS — E875 Hyperkalemia: Secondary | ICD-10-CM | POA: Diagnosis not present

## 2021-11-21 DIAGNOSIS — Z515 Encounter for palliative care: Secondary | ICD-10-CM | POA: Diagnosis not present

## 2021-11-21 DIAGNOSIS — N179 Acute kidney failure, unspecified: Secondary | ICD-10-CM | POA: Diagnosis not present

## 2021-11-21 DIAGNOSIS — A419 Sepsis, unspecified organism: Secondary | ICD-10-CM | POA: Diagnosis not present

## 2021-11-21 DIAGNOSIS — Z66 Do not resuscitate: Secondary | ICD-10-CM

## 2021-11-21 LAB — HEPATITIS B E ANTIBODY: Hep B E Ab: NEGATIVE

## 2021-11-21 LAB — HEMOGLOBIN: Hemoglobin: 9.7 g/dL — ABNORMAL LOW (ref 13.0–17.0)

## 2021-11-21 LAB — URINE CULTURE: Culture: >=100000 — AB

## 2021-11-21 LAB — GLUCOSE, CAPILLARY
Glucose-Capillary: 132 mg/dL — ABNORMAL HIGH (ref 70–99)
Glucose-Capillary: 135 mg/dL — ABNORMAL HIGH (ref 70–99)
Glucose-Capillary: 137 mg/dL — ABNORMAL HIGH (ref 70–99)
Glucose-Capillary: 146 mg/dL — ABNORMAL HIGH (ref 70–99)
Glucose-Capillary: 150 mg/dL — ABNORMAL HIGH (ref 70–99)
Glucose-Capillary: 152 mg/dL — ABNORMAL HIGH (ref 70–99)
Glucose-Capillary: 192 mg/dL — ABNORMAL HIGH (ref 70–99)
Glucose-Capillary: 196 mg/dL — ABNORMAL HIGH (ref 70–99)

## 2021-11-21 LAB — BASIC METABOLIC PANEL
Anion gap: 3 — ABNORMAL LOW (ref 5–15)
BUN: 47 mg/dL — ABNORMAL HIGH (ref 8–23)
CO2: 28 mmol/L (ref 22–32)
Calcium: 9.1 mg/dL (ref 8.9–10.3)
Chloride: 108 mmol/L (ref 98–111)
Creatinine, Ser: 2.33 mg/dL — ABNORMAL HIGH (ref 0.61–1.24)
GFR, Estimated: 27 mL/min — ABNORMAL LOW (ref >60)
Glucose, Bld: 182 mg/dL — ABNORMAL HIGH (ref 70–99)
Potassium: 4.8 mmol/L (ref 3.5–5.1)
Sodium: 139 mmol/L (ref 135–145)

## 2021-11-21 LAB — HEPATITIS B SURFACE ANTIBODY, QUANTITATIVE: Hep B S AB Quant (Post): <3.1 m[IU]/mL — ABNORMAL LOW (ref >9.9)

## 2021-11-21 MED ORDER — ALUM & MAG HYDROXIDE-SIMETH 200-200-20 MG/5ML PO SUSP: 30.0000 mL | Freq: Four times a day (QID) | ORAL | Status: DC | PRN

## 2021-11-21 MED ORDER — HYDRALAZINE HCL 50 MG PO TABS
50.0000 mg | ORAL_TABLET | Freq: Three times a day (TID) | ORAL | Status: DC
  Administered 2021-11-21 – 2021-11-24 (×9): 50 mg via ORAL
  Filled 2021-11-21 (×9): qty 1

## 2021-11-21 MED ORDER — GLUCERNA SHAKE PO LIQD
237.0000 mL | Freq: Three times a day (TID) | ORAL | Status: DC
  Administered 2021-11-21 – 2021-11-24 (×7): 237 mL via ORAL

## 2021-11-21 MED ORDER — HYDRALAZINE HCL 25 MG PO TABS
25.0000 mg | ORAL_TABLET | Freq: Three times a day (TID) | ORAL | Status: DC
  Administered 2021-11-21 (×2): 25 mg via ORAL
  Filled 2021-11-21 (×2): qty 1

## 2021-11-21 MED ORDER — AMOXICILLIN 500 MG PO CAPS
500.0000 mg | ORAL_CAPSULE | Freq: Three times a day (TID) | ORAL | Status: DC
  Administered 2021-11-21 – 2021-11-24 (×10): 500 mg via ORAL
  Filled 2021-11-21 (×11): qty 1

## 2021-11-21 MED ORDER — CARVEDILOL 3.125 MG PO TABS
3.1250 mg | ORAL_TABLET | Freq: Two times a day (BID) | ORAL | Status: DC
  Administered 2021-11-21 – 2021-11-23 (×6): 3.125 mg via ORAL
  Filled 2021-11-21 (×6): qty 1

## 2021-11-21 MED ORDER — CLONIDINE HCL 0.1 MG PO TABS
0.1000 mg | ORAL_TABLET | Freq: Two times a day (BID) | ORAL | Status: DC
  Administered 2021-11-21 – 2021-11-22 (×3): 0.1 mg via ORAL
  Filled 2021-11-21 (×3): qty 1

## 2021-11-21 NOTE — Plan of Care (Signed)
  Problem: Education: Goal: Knowledge of General Education information will improve Description: Including pain rating scale, medication(s)/side effects and non-pharmacologic comfort measures Outcome: Progressing   Problem: Health Behavior/Discharge Planning: Goal: Ability to manage health-related needs will improve Outcome: Progressing   Problem: Clinical Measurements: Goal: Diagnostic test results will improve Outcome: Progressing Goal: Cardiovascular complication will be avoided Outcome: Progressing   Problem: Nutrition: Goal: Adequate nutrition will be maintained Outcome: Progressing   Problem: Pain Managment: Goal: General experience of comfort will improve Outcome: Progressing

## 2021-11-21 NOTE — Progress Notes (Signed)
Daily Progress Note   Patient Name: Justin Griffith       Date: 11/21/2021 DOB: 02/01/1941  Age: 81 y.o. MRN#: [id number] Attending Physician: Loletha Grayer, MD Primary Care Physician: Charlynne Cousins, MD (Inactive) Admit Date: 11/17/2021  Reason for Consultation/Follow-up: Establishing goals of care  Subjective: No complaints - tell me he is ready to go to rehab  Length of Stay: 4  Current Medications: Scheduled Meds:   amoxicillin  500 mg Oral Q8H   apixaban  2.5 mg Oral BID   vitamin C  500 mg Oral BID   carvedilol  3.125 mg Oral BID WC   Chlorhexidine Gluconate Cloth  6 each Topical Daily   cholecalciferol  2,000 Units Oral QHS   cyanocobalamin  1,000 mcg Oral QHS   feeding supplement (GLUCERNA SHAKE)  237 mL Oral TID BM   hydrALAZINE  25 mg Oral Q8H   insulin aspart  0-9 Units Subcutaneous TID WC   magnesium oxide  400 mg Oral Daily   multivitamin with minerals  1 tablet Oral Daily   pantoprazole  40 mg Oral Daily   rosuvastatin  10 mg Oral QHS    Continuous Infusions:  sodium chloride Stopped (11/19/21 1451)    PRN Meds: acetaminophen **OR** acetaminophen, alum & mag hydroxide-simeth, ipratropium-albuterol, ondansetron **OR** ondansetron (ZOFRAN) IV  Physical Exam Constitutional:      General: He is not in acute distress.    Appearance: He is ill-appearing.  Pulmonary:     Effort: Pulmonary effort is normal.  Skin:    General: Skin is warm and dry.  Neurological:     Mental Status: He is alert and oriented to person, place, and time.             Vital Signs: BP (!) 156/67   Pulse 80   Temp 98.2 F (36.8 C)   Resp 18   Ht '5\' 11"'$  (1.803 m)   Wt 108.8 kg   SpO2 95%   BMI 33.45 kg/m  SpO2: SpO2: 95 % O2 Device: O2 Device: Room Air O2 Flow Rate: O2 Flow Rate (L/min):  2 L/min  Intake/output summary:  Intake/Output Summary (Last 24 hours) at 11/21/2021 1531 Last data filed at 11/21/2021 1405 Gross per 24 hour  Intake 940 ml  Output 800 ml  Net 140 ml   LBM: Last BM Date : 11/21/21 Baseline Weight: Weight: 106.9 kg Most recent weight: Weight: 108.8 kg       Palliative Assessment/Data: PPS 40%      Patient Active Problem List   Diagnosis Date Noted   Pressure injury of sacral region, unstageable ([place])    Septic shock ([place]) 11/18/2021   Lobar pneumonia ([place]) 11/18/2021   Acute kidney injury superimposed on chronic kidney disease ([place]) 11/18/2021   Acute lower UTI 11/18/2021   Type 2 diabetes mellitus with chronic kidney disease, without long-term current use of insulin ([place]) 11/18/2021   Hyperkalemia    Recurrent falls 10/31/2021   Stroke ([place]) 10/31/2021   HLD (hyperlipidemia) 10/31/2021   Type II diabetes mellitus with renal manifestations ([place]) 10/31/2021   CAD (coronary artery disease) 10/31/2021   Chronic diastolic CHF (congestive heart failure) ([place]) 10/31/2021   Right  elbow pain 10/15/2021   Gross hematuria 10/12/2021   Chronic anticoagulation 10/12/2021   Stage 3b chronic kidney disease (CKD) ([place]) 10/12/2021   History of pulmonary embolism 09/18/21 10/06/2021   Essential hypertension 09/19/2021   Dyslipidemia 09/19/2021   Generalized weakness 06/25/2021   Lymphedema 03/17/2021   Headache disorder 02/27/2021   Falls frequently 11/28/2020   Aortic atherosclerosis ([place]) 06/12/2020   History of embolic stroke 22/04/5425   At high risk for falls 05/08/2020   History of cold sores 05/06/2020   History of non-ST elevation myocardial infarction (NSTEMI) 03/28/2020   Elevated TSH 02/25/2020   Poor mobility 02/02/2020   Coronary artery disease 01/23/2020   History of CVA (cerebrovascular accident) 01/23/2020   Memory changes 01/01/2020   Chronic venous insufficiency 05/23/2019   B12 deficiency 04/23/2019   Constipation 04/21/2019    Tremor 11/14/2018   Obesity (BMI 30-39.9) 09/07/2017   Chronic obstructive pulmonary disease (COPD) ([place]) 04/29/2017   Intertrigo 02/02/2017   BPH with obstruction/lower urinary tract symptoms Foley dependent 07/23/2016   Advanced care planning/counseling discussion 09/13/7626   Eosinophilic esophagitis 31/51/7616   PAD (peripheral artery disease) ([place]) 01/08/2016   GERD without esophagitis 01/06/2016   Hyperlipidemia associated with type 2 diabetes mellitus ([place]) 01/16/2015   Sleep apnea 01/16/2015   BMI 40.0-44.9, adult ([place]) 01/16/2015   Hypertension associated with diabetes ([place]) 10/11/2014   Type 2 diabetes mellitus with proteinuria ([place]) 10/11/2014   Back pain 10/11/2014   History of colon cancer     Palliative Care Assessment & Plan   HPI: 81 y.o. male  with past medical history of CVA with right-sided deficits, CAD s/p CABG, CHF, CKD, PAD, A-fib, T2DM, HLD, HTN, COPD, and OSA admitted on 11/17/2021 with AMS.  Patient diagnosed with septic shock related to UTI.  He initially required vasopressors but is now off of them.  Patient also with acute kidney injury and hyperkalemia.  Baseline creatinine around 1.8 now 5-6.  Patient also with unstageable sacral pressure injury.  PMT consulted to discuss goals of care.  Assessment: Visited with patient this morning, no family at bedside. Appears that mental status continues to improve.  He is alert and jovial this morning.  We discussed his hospital course and though there are pieces of it he says he does not remember he does have a general understanding that he had an infection that made him really sick and now he has improved and needs to return to rehab. I tell him that I been speaking with his family about his medical care.  He tells me he is okay with his [place] decisions for him as his son is not typically available. We discussed CODE STATUS and I detailed full code versus DNR/DNI for patient.  He  emphatically confirms DNR/DNI telling me when it is "his time" he is ready to go and does not want the medical team to attempt resuscitation. I did attempt to call patient's son again and left a voicemail however I did not get a return call. I spoke with [name] -she confirms she was also told that it is okay for her to make medical decisions for the patient.  We discussed my conversation with the patient surrounding his CODE STATUS and his desire for DNR/DNI.  She agrees with this. [name] shares much of her concerns about what patient's care will look like in the future following rehab we discussed some potential options.  She asks about hospice and we discussed when this may be appropriate.  All questions and concerns addressed.  Recommendations/Plan: CODE STATUS changed to DNR/DNI Family in touch with outpatient palliative care Stepdaughter [name] has been confirmed as decision-maker as wife and patient are cognitively impaired and biological son is difficult to get in touch with  Code Status: DNR  Discharge Planning: [place] for rehab with Palliative care service follow-up  Care plan was discussed with patient and stepdaughter [name]  Thank you for allowing the Palliative Medicine Team to assist in the care of this patient.    *Please note that this is a verbal dictation therefore any spelling or grammatical errors are due to the "[place] [employer]" system interpretation.  Juel Burrow, DNP, [facility] Palliative Medicine Team Team Phone # [phone]  Pager [phone]

## 2021-11-21 NOTE — Progress Notes (Signed)
Nutrition Follow-up  DOCUMENTATION CODES:   Obesity unspecified  INTERVENTION:   -Continue Magic cup TID with meals, each supplement provides 290 kcal and 9 grams of protein  -Continue MVI with minerals daily -Continue 500 mg vitamin C BID -D/c Ensure Enlive po TID, each supplement provides 350 kcal and 20 grams of protein -Glucerna Shake po TID, each supplement provides 220 kcal and 10 grams of protein  -Liberalize diet to carb modified for wider variety of meal selections  NUTRITION DIAGNOSIS:   Inadequate oral intake related to acute illness as evidenced by meal completion < 25%.  Ongoing  GOAL:   Patient will meet greater than or equal to 90% of their needs  Progressing   MONITOR:   PO intake, Supplement acceptance, Labs, Weight trends, Skin, I & O's  REASON FOR ASSESSMENT:   Consult Assessment of nutrition requirement/status  ASSESSMENT:   81 y/o male with h/o GERD, HLD, DM, colon cancer, HTN, OSA, PAD, BPH, COPD, tremor, Afib, CVA, CAD, PE, NSTEMI, CKD III, hernia surgery s/p repair (2000) and urinary retention with chronic foley who is admitted with PNA, sepsis, AKI and encephalopathy.  8/30- advanced to heart healthy, carb modified diet  Reviewed I/O's: -220 ml x 24 hours and +348 ml since admission  UOP: 800 ml x 24 hours    Pt unavailable at time of visit. Attempted to speak with pt via call to hospital room phone, however, unable to reach.   Pt with improved oral intake. Noted meal completions 100%. He is drinking his Ensure supplements.   Per MD notes, likely plan to d/c to SNF today.   Wt has been stable since admission.   Medications reviewed and include vitamin C, vitamin D3, vitamin B-12, and magnesium oxide.   Labs reviewed: CBGS: 146-196 (inpatient orders for glycemic control are 0-9 units insulin aspart TID with meals).    Diet Order:   Diet Order             Diet heart healthy/carb modified Room service appropriate? Yes; Fluid  consistency: Thin  Diet effective now                   EDUCATION NEEDS:   No education needs have been identified at this time  Skin:  Skin Assessment: Reviewed RN Assessment (ecchymosis, unstageable sacral pressure injury)  Last BM:  8/29- type 6  Height:   Ht Readings from Last 1 Encounters:  11/18/21 '5\' 11"'$  (1.803 m)    Weight:   Wt Readings from Last 1 Encounters:  11/21/21 108.8 kg    Ideal Body Weight:  78 kg  BMI:  Body mass index is 33.45 kg/m.  Estimated Nutritional Needs:   Kcal:  2100-2400kcal/day  Protein:  105-120g/day  Fluid:  2.0L/day    Loistine Chance, RD, LDN, [place] Registered Dietitian II Certified Diabetes Care and Education Specialist Please refer to [facility] for RD and/or RD on-call/weekend/after hours pager

## 2021-11-21 NOTE — TOC Progression Note (Signed)
Transition of Care [facility]) - Progression Note    Patient Details  Name: [name] MRN: [id number] Date of Birth: 1940/04/16  Transition of Care [facility]) CM/SW [place], RN Phone Number: 11/21/2021, 1:14 PM  Clinical Narrative:    Damaris Schooner with Tammy at [facility] and the review has been sent to Medical Director, not likely to have an approval today, I provided the weekend [facility] person's contact information    Expected Discharge Plan: [place] Barriers to Discharge: Continued Medical Work up  Expected Discharge Plan and Services Expected Discharge Plan: [place]   Discharge Planning Services: CM Consult Post Acute Care Choice: [place] Living arrangements for the past 2 months: Single Family Home                                       Social Determinants of Health (SDOH) Interventions    Readmission Risk Interventions    09/21/2021   10:42 AM 05/21/2020   10:01 AM 05/18/2020   12:17 PM  Readmission Risk Prevention Plan  Transportation Screening Complete Complete Complete  PCP or Specialist Appt within 3-5 Days Complete Complete   HRI or Home Care Consult Complete Complete Complete  Social Work Consult for [place] Planning/Counseling Complete Complete Complete  Palliative Care Screening Not Applicable Not Applicable Not Applicable  Medication Review Press photographer) Complete Complete Complete

## 2021-11-21 NOTE — Progress Notes (Signed)
Occupational Therapy Treatment Patient Details Name: Justin Griffith MRN: [id number] DOB: 1940/09/03 Today's Date: 11/21/2021   History of present illness Pt is an 81 y.o. male  with past medical history of CVA with right-sided deficits, CAD s/p CABG, CHF, CKD, PAD, A-fib, T2DM, HLD, HTN, COPD, recent NSTEMI, and OSA admitted on 11/17/2021 with AMS from SNF.  Patient diagnosed with septic shock related to UTI.  Patient also with unstageable sacral pressure injury.   OT comments  Pt in bed upon arrival and agreeable to OT services. Pt required mod-max A to roll into side-lying. When pt was asked to rate his pain level he stated a "25".Therapist placed a pillow under left side to provide pressure relief. Pt decline to do anything else during session due to pain. Communication with RN about air mattress for skin integrity and pressure relief. Pt left in bed with call bell and all needs met.    Recommendations for follow up therapy are one component of a multi-disciplinary discharge planning process, led by the attending physician.  Recommendations may be updated based on patient status, additional functional criteria and insurance authorization.    Follow Up Recommendations  Skilled nursing-short term rehab (<3 hours/day)    Assistance Recommended at Discharge Frequent or constant Supervision/Assistance  Patient can return home with the following  Two people to help with walking and/or transfers;Two people to help with bathing/dressing/bathroom;Assistance with cooking/housework;Assist for transportation;Help with stairs or ramp for entrance   Equipment Recommendations  Other (comment) (Defer to next venue of care.)       Precautions / Restrictions Precautions Precautions: Fall Restrictions Weight Bearing Restrictions: No       Mobility Bed Mobility Overal bed mobility: Needs Assistance Bed Mobility: Rolling Rolling: Max assist              Transfers                              ADL either performed or assessed with clinical judgement     Vision Baseline Vision/History: 0 No visual deficits     Perception     Praxis      Cognition Arousal/Alertness: Awake/alert Behavior During Therapy: Anxious (Pt grunting and in a lot of pain.)                                                       Pertinent Vitals/ Pain       Pain Assessment Faces Pain Scale: Hurts worst   Frequency  Min 2X/week        Progress Toward Goals  OT Goals(current goals can now be found in the care plan section)     Acute Rehab OT Goals Patient Stated Goal: to go home. OT Goal Formulation: With patient Time For Goal Achievement: 12/04/21 Potential to Achieve Goals: [place] Discharge plan remains appropriate;Frequency remains appropriate       AM-PAC OT "6 Clicks" Daily Activity     Outcome Measure   Help from another person eating meals?: A Little Help from another person taking care of personal grooming?: A Lot Help from another person toileting, which includes using toliet, bedpan, or urinal?: A Lot Help from another person bathing (including washing, rinsing, drying)?: A Lot Help from another person to put on and  taking off regular upper body clothing?: A Little Help from another person to put on and taking off regular lower body clothing?: A Lot 6 Click Score: 14    End of Session    OT Visit Diagnosis: Unsteadiness on feet (R26.81);Other abnormalities of gait and mobility (R26.89);Muscle weakness (generalized) (M62.81);History of falling (Z91.81);Pain   Activity Tolerance Patient limited by pain   Patient Left in bed;with call bell/phone within reach;with bed alarm set   Nurse Communication Other (comment) (Recommending an air mattress)        Time: 1499-6924 OT Time Calculation (min): 17 min  Charges: OT General Charges $OT Visit: 1 Visit OT Treatments $Therapeutic Activity: 8-22 mins    Nitesh Pitstick,  OTS 11/21/2021, 12:19 PM

## 2021-11-21 NOTE — Progress Notes (Signed)
PT Cancellation Note  Patient Details Name: Justin Griffith MRN: [id number] DOB: Oct 31, 1940   Cancelled Treatment:     Pt was supine in bed with RN at bedside. Pt reports having busy day and having discomfort in low back/sacral region. Was able to acquired speciality bed and assisted pt onto it.Pt has sacral wound and requires bed for wt shifting and wound management. RN applied sacral patch. Non billable PT provided. Will continue to follow as able per current POC.    Willette Pa 11/21/2021, 4:34 PM

## 2021-11-21 NOTE — Progress Notes (Signed)
PHARMACY NOTE:  ANTIMICROBIAL RENAL DOSAGE ADJUSTMENT  Current antimicrobial regimen includes a mismatch between antimicrobial dosage and estimated renal function.  As per policy approved by the Pharmacy & Therapeutics and Medical Executive Committees, the antimicrobial dosage will be adjusted accordingly.  Current antimicrobial dosage:  amoxicillin 500 mg PO q12H  Indication: Lower UTI  Renal Function: Scr 5.60 >> 3.58 >> 2.33  Estimated Creatinine Clearance: 31.2 mL/min (A) (by C-G formula based on SCr of 2.33 mg/dL (H)).    Antimicrobial dosage has been changed to:  amoxicillin 500 mg PO q8H    Thank you for allowing pharmacy to be a part of this patient's care.  Dara Hoyer, PharmD PGY-1 Pharmacy Resident 11/21/2021 7:07 AM

## 2021-11-21 NOTE — Progress Notes (Signed)
  Progress Note   Patient: Justin Griffith SRP:[id number] DOB: Sep 17, 1940 DOA: 11/17/2021     4 DOS: the patient was seen and examined on 11/21/2021    Assessment and Plan: * Septic shock ([place]) - Sepsis manifested by leukocytosis and tachypnea and severe sepsis as manifested by hypotension that persistent despite aggressive hydration and therefore he met criteria for septic shock. -Sepsis is likely secondary to Enterococcus UTI. - Off Levophed from 11/19/21 -Discontinued midodrine on 11/21/2021 -Complete total 10 days of amoxicillin.  Acute lower UTI Catheter related Enterococcus UTI.  Initially on Zosyn and now on amoxicillin.  Foley catheter changed in the emergency room.  Acute kidney injury superimposed on chronic kidney disease ([place]) - This is AKI superimposed on stage IIIb chronic kidney disease.  Patient came in with creatinine of 6.39 and baseline creatinine around 1.86. -Creatinine today is 2.33 with a GFR of 27   Hyperkalemia Improved with improvement of kidney function.  Dyslipidemia Continue Crestor  Lobar pneumonia ([place]) Pneumonia ruled out.  Repeat chest x-ray read as negative.  Obesity (BMI 30-39.9) BMI 33.45  Pressure injury of sacral region, unstageable ([place]) Skin is intact but an area of blackness.  Hard to stage at this point.  Appreciate general surgery consultation  Type 2 diabetes mellitus with chronic kidney disease, without long-term current use of insulin (HCC) - Continue supplement coverage with NovoLog. - We will hold off Glucophage given acute kidney injury  GERD without esophagitis Oral PPI        Subjective: Patient feeling better today.   feels a little bit weak.  Awaiting insurance authorization for rehab.  Eating better.  Admitted with septic shock.  Physical Exam: Vitals:   11/21/21 0401 11/21/21 0500 11/21/21 0817 11/21/21 1348  BP: (!) 144/71  (!) 186/87 (!) 156/67  Pulse: 72  82 80  Resp: 18     Temp: 98.4 F (36.9 C)  98.2 F  (36.8 C)   TempSrc:      SpO2: 93%  95% 95%  Weight:  108.8 kg    Height:       Physical Exam HENT:     Head: Normocephalic.     Mouth/Throat:     Pharynx: No oropharyngeal exudate.  Eyes:     General: Lids are normal.     Conjunctiva/sclera: Conjunctivae normal.  Cardiovascular:     Rate and Rhythm: Normal rate and regular rhythm.     Heart sounds: Normal heart sounds, S1 normal and S2 normal.  Pulmonary:     Breath sounds: No decreased breath sounds, wheezing, rhonchi or rales.  Abdominal:     Palpations: Abdomen is soft.     Tenderness: There is no abdominal tenderness.  Musculoskeletal:     Right lower leg: Swelling present.     Left lower leg: Swelling present.  Skin:    General: Skin is warm.     Comments: Redness in the sacral area and also an area of black in the center.  Neurological:     Mental Status: He is alert.     Comments: Answers questions appropriately     Data Reviewed: Creatinine down to 2.33, hemoglobin 9.7  Family Communication: Updated patient's daughter on the phone  Disposition: Status is: Inpatient Remains inpatient appropriate because: Awaiting insurance authorization to go out to rehab  Planned Discharge Destination: Rehab    Time spent: 28 minutes  Author: Loletha Grayer, MD 11/21/2021 3:39 PM  For on call review [email or web address].

## 2021-11-21 NOTE — TOC Progression Note (Signed)
Transition of Care [facility]) - Progression Note    Patient Details  Name: Justin Griffith MRN: [id number] Date of Birth: Dec 22, 1940  Transition of Care [facility] CM/SW [place], RN Phone Number: 11/21/2021, 9:46 AM  Clinical Narrative:     The patient's step daughter [name] accepted the bed offer for [facility], the patient has been there before,, I called [facility] and requested ins approval also requested EMS approval, Awaiting approval to go to [facility]   Expected Discharge Plan: [place] Barriers to Discharge: Continued Medical Work up  Expected Discharge Plan and Services Expected Discharge Plan: [place]   Discharge Planning Services: CM Consult Post Acute Care Choice: [place] Living arrangements for the past 2 months: Single Family Home                                       Social Determinants of Health (SDOH) Interventions    Readmission Risk Interventions    09/21/2021   10:42 AM 05/21/2020   10:01 AM 05/18/2020   12:17 PM  Readmission Risk Prevention Plan  Transportation Screening Complete Complete Complete  PCP or Specialist Appt within 3-5 Days Complete Complete   HRI or Home Care Consult Complete Complete Complete  Social Work Consult for [place] Planning/Counseling Complete Complete Complete  Palliative Care Screening Not Applicable Not Applicable Not Applicable  Medication Review Press photographer) Complete Complete Complete

## 2021-11-21 NOTE — Progress Notes (Signed)
[facility]  ROUNDING NOTE   Subjective:   Patient seen sitting up in bed, alert and oriented Appetite appropriate States he feels well and is prepared for discharge  Creatinine 2.33   Objective:  Vital signs in last 24 hours:  Temp:  [97.7 F (36.5 C)-98.7 F (37.1 C)] 98.2 F (36.8 C) (09/01 0817) Pulse Rate:  [61-82] 82 (09/01 0817) Resp:  [16-18] 18 (09/01 0401) BP: (144-186)/(65-87) 186/87 (09/01 0817) SpO2:  [92 %-98 %] 95 % (09/01 0817) Weight:  [108.8 kg] 108.8 kg (09/01 0500)  Weight change:  Filed Weights   11/18/21 0400 11/19/21 0500 11/21/21 0500  Weight: 109.5 kg 110.4 kg 108.8 kg    Intake/Output: I/O last 3 completed shifts: In: 819.8 [P.O.:700; IV Piggyback:119.8] Out: 2200 [Urine:2200]   Intake/Output this shift:  No intake/output data recorded.  Physical Exam: General: NAD  Head: Normocephalic, atraumatic. Moist oral mucosal membranes  Eyes: Anicteric  Lungs:  Clear to auscultation, normal effort  Heart: Regular rate and rhythm  Abdomen:  Soft, nontender  Extremities:  trace peripheral edema.  Neurologic: Nonfocal, moving all four extremities  Skin: No lesions  Access: None    Basic Metabolic Panel: Recent Labs  Lab 11/18/21 0329 11/18/21 1250 11/18/21 1756 11/19/21 0147 11/20/21 0439 11/21/21 0529  NA 135 136 136 138 140 139  K 5.9* 5.6* 5.2* 5.4* 4.7 4.8  CL 103 106 102 106 106 108  CO2 '23 23 23 24 26 28  '$ GLUCOSE 134* 87 142* 100* 130* 182*  BUN 109* 104* 104* 99* 71* 47*  CREATININE 5.91* 5.81* 5.60* 5.60* 3.58* 2.33*  CALCIUM 9.1 9.2 9.2 9.2 9.2 9.1  MG 2.3  --   --  2.3 2.1  --   PHOS 4.7*  --   --  3.7 2.6  --      Liver Function Tests: Recent Labs  Lab 11/17/21 2142  AST 26  ALT 13  ALKPHOS 39  BILITOT 0.5  PROT 6.9  ALBUMIN 3.0*    No results for input(s): "LIPASE", "AMYLASE" in the last 168 hours. No results for input(s): "AMMONIA" in the last 168 hours.  CBC: Recent Labs  Lab  11/17/21 2142 11/18/21 0329 11/19/21 0147 11/20/21 0439 11/21/21 0529  WBC 14.2* 12.7* 8.9 6.6  --   NEUTROABS 11.0*  --  6.2 4.3  --   HGB 12.5* 10.3* 10.0* 9.9* 9.7*  HCT 39.9 32.9* 31.1* 30.3*  --   MCV 93.7 92.4 90.4 90.7  --   PLT 280 244 258 249  --      Cardiac Enzymes: No results for input(s): "CKTOTAL", "CKMB", "CKMBINDEX", "TROPONINI" in the last 168 hours.  BNP: Invalid input(s): "POCBNP"  CBG: Recent Labs  Lab 11/20/21 1929 11/20/21 2126 11/21/21 0009 11/21/21 0406 11/21/21 0820  GLUCAP 172* 147* 192* 196* 146*     Microbiology: Results for orders placed or performed during the hospital encounter of 11/17/21  Blood Culture (routine x 2)     Status: None (Preliminary result)   Collection Time: 11/17/21  9:42 PM   Specimen: BLOOD  Result Value Ref Range Status   Specimen Description BLOOD LEFT [facility]  Final   Special Requests   Final    BOTTLES DRAWN AEROBIC AND ANAEROBIC Blood Culture adequate volume   Culture   Final    NO GROWTH 4 DAYS Performed at [facility], [address], [place], [place] [address]    Report Status PENDING  Incomplete  Urine Culture  Status: Abnormal   Collection Time: 11/17/21  9:42 PM   Specimen: In/Out Cath Urine  Result Value Ref Range Status   Specimen Description   Final    IN/OUT CATH URINE Performed at [facility], [address] [place], [place] [address]    Special Requests   Final    NONE Performed at [facility], [address] [place], [place] [address]    Culture (A)  Final    >=100,000 COLONIES/mL ENTEROCOCCUS FAECALIS 10,000 COLONIES/mL STAPHYLOCOCCUS HAEMOLYTICUS    Report Status 11/21/2021 FINAL  Final   Organism ID, Bacteria ENTEROCOCCUS FAECALIS (A)  Final   Organism ID, Bacteria STAPHYLOCOCCUS HAEMOLYTICUS (A)  Final      Susceptibility   Enterococcus faecalis - MIC*    AMPICILLIN <=2 SENSITIVE Sensitive     NITROFURANTOIN <=16 SENSITIVE Sensitive      VANCOMYCIN 1 SENSITIVE Sensitive     * >=100,000 COLONIES/mL ENTEROCOCCUS FAECALIS   Staphylococcus haemolyticus - MIC*    CIPROFLOXACIN >=8 RESISTANT Resistant     GENTAMICIN >=16 RESISTANT Resistant     NITROFURANTOIN <=16 SENSITIVE Sensitive     OXACILLIN >=4 RESISTANT Resistant     TETRACYCLINE <=1 SENSITIVE Sensitive     VANCOMYCIN 1 SENSITIVE Sensitive     TRIMETH/SULFA >=320 RESISTANT Resistant     CLINDAMYCIN >=8 RESISTANT Resistant     RIFAMPIN <=0.5 SENSITIVE Sensitive     Inducible Clindamycin NEGATIVE Sensitive     * 10,000 COLONIES/mL STAPHYLOCOCCUS HAEMOLYTICUS  Blood Culture (routine x 2)     Status: None (Preliminary result)   Collection Time: 11/17/21  9:59 PM   Specimen: BLOOD  Result Value Ref Range Status   Specimen Description BLOOD RIGHT FA  Final   Special Requests   Final    BOTTLES DRAWN AEROBIC AND ANAEROBIC Blood Culture adequate volume   Culture   Final    NO GROWTH 4 DAYS Performed at [facility], [address] [place], [place] [address]    Report Status PENDING  Incomplete  MRSA Next Gen by PCR, Nasal     Status: None   Collection Time: 11/18/21  4:49 AM   Specimen: Nasal Mucosa; Nasal Swab  Result Value Ref Range Status   MRSA by PCR Next Gen NOT DETECTED NOT DETECTED Final    Comment: (NOTE) The GeneXpert MRSA Assay (FDA approved for NASAL specimens only), is one component of a comprehensive MRSA colonization surveillance program. It is not intended to diagnose MRSA infection nor to guide or monitor treatment for MRSA infections. Test performance is not FDA approved in patients less than 59 years old. Performed at [facility], [address] [place], [place] [address]     Coagulation Studies: No results for input(s): "LABPROT", "INR" in the last 72 hours.   Urinalysis: No results for input(s): "COLORURINE", "LABSPEC", "PHURINE", "GLUCOSEU", "HGBUR", "BILIRUBINUR", "KETONESUR", "PROTEINUR", "UROBILINOGEN",  "NITRITE", "LEUKOCYTESUR" in the last 72 hours.  Invalid input(s): "APPERANCEUR"     Imaging: No results found.   Medications:    sodium chloride Stopped (11/19/21 1451)    alum & mag hydroxide-simeth  30 mL Oral TID AC & HS   amoxicillin  500 mg Oral Q8H   apixaban  2.5 mg Oral BID   vitamin C  500 mg Oral BID   carvedilol  3.125 mg Oral BID WC   Chlorhexidine Gluconate Cloth  6 each Topical Daily   cholecalciferol  2,000 Units Oral QHS   cyanocobalamin  1,000 mcg  Oral QHS   feeding supplement (GLUCERNA SHAKE)  237 mL Oral TID BM   hydrALAZINE  25 mg Oral Q8H   insulin aspart  0-9 Units Subcutaneous TID WC   magnesium oxide  400 mg Oral Daily   multivitamin with minerals  1 tablet Oral Daily   pantoprazole  40 mg Oral Daily   rosuvastatin  10 mg Oral QHS   acetaminophen **OR** acetaminophen, ipratropium-albuterol, ondansetron **OR** ondansetron (ZOFRAN) IV  Assessment/ Plan:  Mr. TREVEON BOURCIER is a 81 y.o.  male with past medical conditions including COPD, hypertension, dyslipidemia, on Eliquis for PE, diabetes, diastolic heart failure with EF 60 to 65%, and chronic kidney disease stage IIIb, who was admitted to [facility] on 11/17/2021 for Hyperkalemia [E87.5] Septic shock ([place]) [A41.9, R65.21] Sepsis due to pneumonia ([place]) [J18.9, A41.9] Sepsis, due to unspecified organism, unspecified whether acute organ dysfunction present ([place]) [A41.9]   Acute kidney injury with hyperkalemia on chronic kidney disease stage IIIb.  Baseline creatinine appears to be 1.86 with GFR 36 on 11/02/2021.  Acute kidney injury appears secondary to severe illness, UTI, and pneumonia.  Urine culture positive for Enterococcus.  No IV contrast exposure.  Renal ultrasound negative for hydronephrosis.    Creatinine continues to improve.  Patient maintaining adequate oral intake.  We will schedule outpatient appointment with our office in 1 to 2 weeks.    Lab Results  Component Value Date   CREATININE 2.33  (H) 11/21/2021   CREATININE 3.58 (H) 11/20/2021   CREATININE 5.60 (H) 11/19/2021    Intake/Output Summary (Last 24 hours) at 11/21/2021 1009 Last data filed at 11/20/2021 2200 Gross per 24 hour  Intake 579.78 ml  Output 800 ml  Net -220.22 ml    2. Anemia of chronic kidney disease  Lab Results  Component Value Date   HGB 9.7 (L) 11/21/2021    Hemoglobin remaining stable.  3.  Hypertension with chronic kidney disease.  Home regimen includes carvedilol, clonidine, furosemide, and hydralazine.  Currently receiving carvedilol and hydralazine.  Blood pressure currently 186/87.   4. Diabetes mellitus type II with chronic kidney disease: insulin dependent. Home regimen includes glargine. Most recent hemoglobin A1c is 8.1 on 10/30/2021.  Metformin currently held.    LOS: [place] 9/1/202310:09 AM

## 2021-11-22 DIAGNOSIS — A419 Sepsis, unspecified organism: Secondary | ICD-10-CM | POA: Diagnosis not present

## 2021-11-22 DIAGNOSIS — I10 Essential (primary) hypertension: Secondary | ICD-10-CM | POA: Diagnosis not present

## 2021-11-22 DIAGNOSIS — N179 Acute kidney failure, unspecified: Secondary | ICD-10-CM | POA: Diagnosis not present

## 2021-11-22 DIAGNOSIS — N390 Urinary tract infection, site not specified: Secondary | ICD-10-CM | POA: Diagnosis not present

## 2021-11-22 LAB — GLUCOSE, CAPILLARY
Glucose-Capillary: 136 mg/dL — ABNORMAL HIGH (ref 70–99)
Glucose-Capillary: 125 mg/dL — ABNORMAL HIGH (ref 70–99)
Glucose-Capillary: 142 mg/dL — ABNORMAL HIGH (ref 70–99)
Glucose-Capillary: 162 mg/dL — ABNORMAL HIGH (ref 70–99)
Glucose-Capillary: 153 mg/dL — ABNORMAL HIGH (ref 70–99)
Glucose-Capillary: 152 mg/dL — ABNORMAL HIGH (ref 70–99)

## 2021-11-22 LAB — BASIC METABOLIC PANEL
Anion gap: 3 — ABNORMAL LOW (ref 5–15)
BUN: 28 mg/dL — ABNORMAL HIGH (ref 8–23)
CO2: 26 mmol/L (ref 22–32)
Calcium: 9.3 mg/dL (ref 8.9–10.3)
Chloride: 109 mmol/L (ref 98–111)
Creatinine, Ser: 1.46 mg/dL — ABNORMAL HIGH (ref 0.61–1.24)
GFR, Estimated: 48 mL/min — ABNORMAL LOW (ref >60)
Glucose, Bld: 134 mg/dL — ABNORMAL HIGH (ref 70–99)
Potassium: 4.4 mmol/L (ref 3.5–5.1)
Sodium: 138 mmol/L (ref 135–145)

## 2021-11-22 LAB — CULTURE, BLOOD (ROUTINE X 2)
Culture: NO GROWTH
Culture: NO GROWTH
Special Requests: ADEQUATE
Special Requests: ADEQUATE

## 2021-11-22 NOTE — Plan of Care (Signed)

## 2021-11-22 NOTE — Assessment & Plan Note (Signed)
Blood pressure medications restarted including clonidine Coreg and hydralazine.

## 2021-11-22 NOTE — Plan of Care (Signed)
  Problem: Clinical Measurements: Goal: Diagnostic test results will improve Outcome: Progressing Goal: Signs and symptoms of infection will decrease Outcome: Progressing   Problem: Education: Goal: Knowledge of General Education information will improve Description: Including pain rating scale, medication(s)/side effects and non-pharmacologic comfort measures Outcome: Progressing   Problem: Clinical Measurements: Goal: Respiratory complications will improve Outcome: Progressing Goal: Cardiovascular complication will be avoided Outcome: Progressing

## 2021-11-22 NOTE — Progress Notes (Signed)
  Progress Note   Patient: Justin Griffith XYV:[id number] DOB: [birth year] DOA: 11/17/2021     5 DOS: the patient was seen and examined on 11/22/2021     Assessment and Plan: * Septic shock ([place]) - Sepsis manifested by leukocytosis and tachypnea and severe sepsis as manifested by hypotension that persistent despite aggressive hydration and therefore he met criteria for septic shock. -Sepsis is likely secondary to Enterococcus UTI. - Off Levophed from 11/19/21 -Discontinued midodrine on 11/21/2021 -Complete total 10 days of amoxicillin.  Acute lower UTI Catheter related Enterococcus UTI.  Initially on Zosyn and now on amoxicillin.  Foley catheter changed in the emergency room.  Acute kidney injury superimposed on chronic kidney disease ([place]) - This is AKI superimposed on stage IIIb chronic kidney disease.  Patient came in with creatinine of 6.39 and today's creatinine down to 1.46.  Essential hypertension Blood pressure medications needed to be restarted yesterday including Coreg, hydralazine and clonidine  Hyperkalemia Improved with improvement of kidney function.  Dyslipidemia Continue Crestor  Lobar pneumonia ([place]) Pneumonia ruled out.  Repeat chest x-ray read as negative.  Obesity (BMI 30-39.9) BMI 33.45  Pressure injury of sacral region, unstageable ([place]) Skin is intact but an area of blackness.  Hard to stage at this point.  Appreciate general surgery consultation  Type 2 diabetes mellitus with chronic kidney disease, without long-term current use of insulin (HCC) - Continue supplement coverage with NovoLog. - We will hold off Glucophage given acute kidney injury  GERD without esophagitis Oral PPI        Subjective: Patient awakened from sleep this morning.  Feels okay.  Offers no complaints.  Initially admitted with septic shock.  Physical Exam: Vitals:   11/21/21 2010 11/22/21 0409 11/22/21 0530 11/22/21 0738  BP: (!) 172/66 (!) 158/72 (!) 158/61 (!) 140/63   Pulse: 88 76 66 66  Resp: _0 Temp: 98.4 F (36.9 C) 98.9 F (37.2 C) 98.9 F (37.2 C) 98.4 F (36.9 C)  TempSrc:  Oral Oral   SpO2: 99% 96% 96% 96%  Weight:      Height:       Physical Exam HENT:     Head: Normocephalic.     Mouth/Throat:     Pharynx: No oropharyngeal exudate.  Eyes:     General: Lids are normal.     Conjunctiva/sclera: Conjunctivae normal.  Cardiovascular:     Rate and Rhythm: Normal rate and regular rhythm.     Heart sounds: Normal heart sounds, S1 normal and S2 normal.  Pulmonary:     Breath sounds: No decreased breath sounds, wheezing, rhonchi or rales.  Abdominal:     Palpations: Abdomen is soft.     Tenderness: There is no abdominal tenderness.  Musculoskeletal:     Right lower leg: Swelling present.     Left lower leg: Swelling present.  Skin:    General: Skin is warm.  Neurological:     Mental Status: He is alert.     Comments: Answers questions appropriately     Data Reviewed: Creatinine today down to 1.46   Disposition: Status is: Inpatient Remains inpatient appropriate because: We did not receive insurance authorization on 11/21/2021.  Unlikely to receive it over the holiday weekend.  Was stable for discharge on 11/21/2021 to rehab.  Planned Discharge Destination: Rehab    Time spent: 28 minutes  Author: Loletha Grayer, MD 11/22/2021 2:49 PM  For on call review [email or web address].

## 2021-11-23 ENCOUNTER — Other Ambulatory Visit: Payer: Self-pay

## 2021-11-23 ENCOUNTER — Encounter: Payer: Self-pay | Admitting: Family Medicine

## 2021-11-23 DIAGNOSIS — N179 Acute kidney failure, unspecified: Secondary | ICD-10-CM | POA: Diagnosis not present

## 2021-11-23 DIAGNOSIS — R531 Weakness: Secondary | ICD-10-CM

## 2021-11-23 DIAGNOSIS — R1030 Lower abdominal pain, unspecified: Secondary | ICD-10-CM

## 2021-11-23 DIAGNOSIS — A419 Sepsis, unspecified organism: Secondary | ICD-10-CM | POA: Diagnosis not present

## 2021-11-23 DIAGNOSIS — N390 Urinary tract infection, site not specified: Secondary | ICD-10-CM | POA: Diagnosis not present

## 2021-11-23 LAB — GLUCOSE, CAPILLARY
Glucose-Capillary: 120 mg/dL — ABNORMAL HIGH (ref 70–99)
Glucose-Capillary: 126 mg/dL — ABNORMAL HIGH (ref 70–99)
Glucose-Capillary: 139 mg/dL — ABNORMAL HIGH (ref 70–99)
Glucose-Capillary: 145 mg/dL — ABNORMAL HIGH (ref 70–99)
Glucose-Capillary: 189 mg/dL — ABNORMAL HIGH (ref 70–99)

## 2021-11-23 MED ORDER — LIDOCAINE HCL URETHRAL/MUCOSAL 2 % EX GEL
1.0000 | CUTANEOUS | Status: DC | PRN
  Filled 2021-11-23: qty 5

## 2021-11-23 MED ORDER — CARVEDILOL 3.125 MG PO TABS
6.2500 mg | ORAL_TABLET | Freq: Two times a day (BID) | ORAL | Status: DC
  Administered 2021-11-24: 6.25 mg via ORAL
  Filled 2021-11-23: qty 2

## 2021-11-23 MED ORDER — CLONIDINE HCL 0.1 MG PO TABS
0.2000 mg | ORAL_TABLET | Freq: Two times a day (BID) | ORAL | Status: DC
  Administered 2021-11-23 (×2): 0.2 mg via ORAL
  Filled 2021-11-23 (×2): qty 2

## 2021-11-23 NOTE — Assessment & Plan Note (Addendum)
Foley catheter needed to be changed on 11/23/2021 because of lower abdominal pain. Abdominal pain resolved after Foley catheter changed.  Follow-up with urology as outpatient.  Change Foley catheter every month.

## 2021-11-23 NOTE — Plan of Care (Signed)
  Problem: Fluid Volume: Goal: Hemodynamic stability will improve Outcome: Progressing   Problem: Clinical Measurements: Goal: Diagnostic test results will improve Outcome: Progressing Goal: Signs and symptoms of infection will decrease Outcome: Progressing   Problem: Education: Goal: Knowledge of General Education information will improve Description: Including pain rating scale, medication(s)/side effects and non-pharmacologic comfort measures Outcome: Progressing   Problem: Health Behavior/Discharge Planning: Goal: Ability to manage health-related needs will improve Outcome: Progressing   Problem: Clinical Measurements: Goal: Respiratory complications will improve Outcome: Progressing Goal: Cardiovascular complication will be avoided Outcome: Progressing   Problem: Activity: Goal: Risk for activity intolerance will decrease Outcome: Progressing   Problem: Nutrition: Goal: Adequate nutrition will be maintained Outcome: Progressing   Problem: Coping: Goal: Level of anxiety will decrease Outcome: Progressing

## 2021-11-23 NOTE — Progress Notes (Signed)
   11/23/21 1828  Orthostatic Lying   Pulse- Lying 89  BP- Lying 139/82  Orthostatic Sitting  Pulse- Sitting 94  BP- Sitting (!) 156/100  Orthostatic Standing at 0 minutes  Pulse- Standing at 0 minutes 113  BP- Standing at 0 minutes (!) 141/95  Orthostatic Standing at 3 minutes  BP- Standing at 3 minutes (!) 165/109  Pulse- Standing at 3 minutes 125   Orthostatic vitals obtained, Dr Leslye Peer made aware.

## 2021-11-23 NOTE — Progress Notes (Signed)
Physical Therapy Treatment Patient Details Name: Justin Griffith MRN: [id number] DOB: 01/15/1941 Today's Date: 11/23/2021   History of Present Illness Pt is an 81 y.o. male  with past medical history of CVA with right-sided deficits, CAD s/p CABG, CHF, CKD, PAD, A-fib, T2DM, HLD, HTN, COPD, recent NSTEMI, and OSA admitted on 11/17/2021 with AMS from SNF.  Patient diagnosed with septic shock related to UTI.  Patient also with unstageable sacral pressure injury.    PT Comments    Pt seen for PT tx with pt agreeable. Pt is oriented to location & hospital & self. Pt reports prior to admission he was caring for his wife who has alzheimer's. On this date, pt is initially able to transfer STS with min assist & ambulate to door & back with RW & min assist but fatigues halfway through & requires max assist to prevent LOB when turning. By time pt returns to chair he is fatigued so rest break provided. Pt attempts 5x STS for BLE & general strengthening but only able to complete 2 reps with pt requiring up to mod/max assist with pt demonstrating BLE weakness & fatigue. Pt remains a very high fall risk & unable to perform functional mobility to care for himself. Pt would benefit from STR upon d/c to maximize independence with functional mobility & reduce fall risk prior to return home.    Recommendations for follow up therapy are one component of a multi-disciplinary discharge planning process, led by the attending physician.  Recommendations may be updated based on patient status, additional functional criteria and insurance authorization.  Follow Up Recommendations  Skilled nursing-short term rehab (<3 hours/day) Can patient physically be transported by private vehicle: No   Assistance Recommended at Discharge Frequent or constant Supervision/Assistance  Patient can return home with the following A lot of help with walking and/or transfers;A lot of help with bathing/dressing/bathroom;Assist for  transportation;Assistance with cooking/housework;Help with stairs or ramp for entrance   Equipment Recommendations   (TBD in next venue)    Recommendations for Other Services       Precautions / Restrictions Precautions Precautions: Fall Restrictions Weight Bearing Restrictions: No     Mobility  Bed Mobility               General bed mobility comments: pt received & left sitting in recliner    Transfers Overall transfer level: Needs assistance Equipment used: Rolling walker (2 wheels) Transfers: Sit to/from Stand             General transfer comment: Pt requires cuing for safe hand placement to push to standing vs pulling on RW. Min assist increasing to mod/max assist.    Ambulation/Gait Ambulation/Gait assistance: Min assist, Max assist Gait Distance (Feet): 20 Feet Assistive device: Rolling walker (2 wheels) Gait Pattern/deviations: Decreased step length - right, Decreased step length - left, Decreased stride length Gait velocity: decreased     General Gait Details: significant LOB when turning, requiring max assist to correct   Stairs             Wheelchair Mobility    Modified Rankin (Stroke Patients Only)       Balance Overall balance assessment: Needs assistance Sitting-balance support: Feet supported Sitting balance-Leahy Scale: Good Sitting balance - Comments: sitting in recliner without LOB   Standing balance support: Bilateral upper extremity supported, Reliant on assistive device for balance Standing balance-Leahy Scale: Poor  Cognition Arousal/Alertness: Awake/alert   Overall Cognitive Status: Within Functional Limits for tasks assessed                                 General Comments: Pt oriented to self, year & hospital        Exercises      General Comments General comments (skin integrity, edema, etc.): SpO2 95%, HR up to 137 bpm after gait      Pertinent  Vitals/Pain Pain Assessment Pain Assessment: No/denies pain    Home Living                          Prior Function            PT Goals (current goals can now be found in the care plan section) Acute Rehab PT Goals Patient Stated Goal: to get stronger PT Goal Formulation: With patient Time For Goal Achievement: 12/04/21 Potential to Achieve Goals: Fair Progress towards PT goals: Progressing toward goals    Frequency    Min 2X/week      PT Plan Current plan remains appropriate    Co-evaluation              AM-PAC PT "6 Clicks" Mobility   Outcome Measure  Help needed turning from your back to your side while in a flat bed without using bedrails?: A Little Help needed moving from lying on your back to sitting on the side of a flat bed without using bedrails?: A Lot Help needed moving to and from a bed to a chair (including a wheelchair)?: A Lot Help needed standing up from a chair using your arms (e.g., wheelchair or bedside chair)?: A Lot Help needed to walk in hospital room?: A Lot Help needed climbing 3-5 steps with a railing? : Total 6 Click Score: 12    End of Session Equipment Utilized During Treatment: Gait belt Activity Tolerance: Patient tolerated treatment well (pt notes 5/10 fatigue on RPE scale after gait) Patient left: in chair;with call bell/phone within reach   PT Visit Diagnosis: Difficulty in walking, not elsewhere classified (R26.2);Other abnormalities of gait and mobility (R26.89);History of falling (Z91.81);Muscle weakness (generalized) (M62.81);Unsteadiness on feet (R26.81)     Time: 6286-3817 PT Time Calculation (min) (ACUTE ONLY): 10 min  Charges:  $Therapeutic Activity: 8-22 mins                     Justin Griffith, PT, DPT 11/23/21, 10:30 AM    Justin Griffith 11/23/2021, 10:26 AM

## 2021-11-23 NOTE — Plan of Care (Signed)

## 2021-11-23 NOTE — TOC Progression Note (Addendum)
Transition of Care [facility]) - Progression Note    Patient Details  Name: Justin Griffith MRN: [id number] Date of Birth: 1940-06-20  Transition of [facility] CM/SW [place], [place] Phone Number: 11/23/2021, 8:39 AM  Clinical Narrative:     Update: Patient approved after peer to peer, per Magda Paganini at [facility] she can take patient before noon on 9/4, MD updated.  Auth number: [id number]    Per HTA patient's insurance denied.   Peer to peer can be done with [facility] at [phone]. Completed today by 2pm.   CSW has informed MD of above.   Pending insurance for [facility], dependent on HTA denial outcomes.    Expected Discharge Plan: [place] Barriers to Discharge: Continued Medical Work up  Expected Discharge Plan and Services Expected Discharge Plan: [place]   Discharge Planning Services: CM Consult Post Acute Care Choice: [place] Living arrangements for the past 2 months: Single Family Home                                       Social Determinants of Health (SDOH) Interventions    Readmission Risk Interventions    09/21/2021   10:42 AM 05/21/2020   10:01 AM 05/18/2020   12:17 PM  Readmission Risk Prevention Plan  Transportation Screening Complete Complete Complete  PCP or Specialist Appt within 3-5 Days Complete Complete   HRI or Home Care Consult Complete Complete Complete  Social Work Consult for [place] Planning/Counseling Complete Complete Complete  Palliative Care Screening Not Applicable Not Applicable Not Applicable  Medication Review Press photographer) Complete Complete Complete

## 2021-11-23 NOTE — Assessment & Plan Note (Signed)
Walked 20 feet yesterday with physical therapy but still needed quite a bit of assistance and patient got weak while walking.  Physical therapy still recommending rehab.

## 2021-11-23 NOTE — Progress Notes (Signed)
Progress Note   Patient: Justin Griffith QZR:[id number] DOB: Oct 14, 1940 DOA: 11/17/2021     6 DOS: the patient was seen and examined on 11/23/2021     Assessment and Plan: * Septic shock ([place]) - Sepsis manifested by leukocytosis and tachypnea and severe sepsis as manifested by hypotension that persistent despite aggressive hydration and therefore he met criteria for septic shock. -Sepsis is likely secondary to Enterococcus UTI. - Off Levophed from 11/19/21 -Discontinued midodrine on 11/21/2021 -Complete total 10 days of amoxicillin.  Lower abdominal pain Foley catheter needed to be changed on 11/23/2021 because of lower abdominal pain and was unable to be flushed today.  Abdominal pain resolved after Foley catheter changed.  Acute lower UTI Catheter related Enterococcus UTI.  Initially on Zosyn and now on amoxicillin.  Foley catheter changed in the emergency room.  Acute kidney injury superimposed on chronic kidney disease ([place]) - This is AKI superimposed on stage IIIb chronic kidney disease.  Patient came in with creatinine of 6.39 and last creatinine down to 1.46.  Essential hypertension Blood pressure medications needed to be restarted yesterday including Coreg, hydralazine and clonidine  Hyperkalemia Improved with improvement of kidney function.  Dyslipidemia Continue Crestor  Lobar pneumonia ([place]) Pneumonia ruled out.  Repeat chest x-ray read as negative.  Obesity (BMI 30-39.9) BMI 33.45  Pressure injury of sacral region, unstageable ([place]) Skin is intact but an area of blackness.  Hard to stage at this point.  Appreciate general surgery consultation  Type 2 diabetes mellitus with chronic kidney disease, without long-term current use of insulin (HCC) - Continue supplement coverage with NovoLog. - We will hold off Glucophage given acute kidney injury  Generalized weakness Walked 20 feet today with physical therapy but still needed quite a bit of assistance and patient got  weak while walking.  Physical therapy still recommending rehab.  GERD without esophagitis Oral PPI        Subjective: Patient this morning had severe lower abdominal pain.  Nursing staff was unable to flush the Foley.  Foley catheter had to be changed.  Patient had good relief with new Foley catheter.  Walked 20 feet with physical therapy but gave out and needed to assist.  Physical therapy still recommending rehab.  Physical Exam: Vitals:   11/22/21 1911 11/23/21 0440 11/23/21 0500 11/23/21 0754  BP: (!) 160/72 (!) 160/73  (!) 184/91  Pulse: 84 74  98  Resp: '17 18  17  ' Temp: 97.6 F (36.4 C) 98.3 F (36.8 C)  97.6 F (36.4 C)  TempSrc:      SpO2: 96% 97%  97%  Weight:   106.3 kg   Height:       Physical Exam HENT:     Head: Normocephalic.     Mouth/Throat:     Pharynx: No oropharyngeal exudate.  Eyes:     General: Lids are normal.     Conjunctiva/sclera: Conjunctivae normal.  Cardiovascular:     Rate and Rhythm: Normal rate and regular rhythm.     Heart sounds: Normal heart sounds, S1 normal and S2 normal.  Pulmonary:     Breath sounds: No decreased breath sounds, wheezing, rhonchi or rales.  Abdominal:     Palpations: Abdomen is soft.     Tenderness: There is no abdominal tenderness.  Musculoskeletal:     Right lower leg: Swelling present.     Left lower leg: Swelling present.  Skin:    General: Skin is warm.  Neurological:     Mental  Status: He is alert.     Comments: Answers questions appropriately     Data Reviewed: Last creatinine 1.46  Family Communication: Updated patient's sister on the phone  Disposition: Status is: Inpatient Remains inpatient appropriate because: I did a peer to peer today and rehab was approved.  Unfortunately the rehab facility cannot take over the holiday weekend and disposition will be on Tuesday  Planned Discharge Destination: Skilled nursing facility    Time spent: 28 minutes  Author: Loletha Grayer, MD 11/23/2021  2:35 PM  For on call review [email or web address].

## 2021-11-23 NOTE — Progress Notes (Addendum)
Rn called to bedside by patient who was yelling at staff from his room. Upon entering the room, pt initially requested to be moved to the chair because he was uncomfortable in the bed. I advised patient that I would like to assess him more closely and found his chronic foley to be empty with traces of hematuria in the tubing. Rn scanned bladder using U/S however scanner was only showing small amounts (33 mls) however, I could palpate the patient's bladder and pressure applied made patient yell out in pain. Concerned for obstructed bladder outlet, Rn requested verbal order from Dr. Leslye Peer to perform bladder irrigation to which patient experienced even greater pain when 60 mLs of sterile water was inserted into the bladder. No urine returned on attempt to extract that same 60 mLs. Rn requested foley removal and reinsertion of new foley to re-establish urine output flow. With assistance, Rn applied lidocaine jelly and using 16 FR and sterile technique, new foley placed with successful urine return. 2000 mLs of yellow urine was emptied into the drainage bag. Patient stated he was beginning to feel less pain and pressure and resolved back/flank pain not originally reported on initial assessment. Tylenol given for pain. Rn educated patient on the importance of recognizing this pain and calling out immediately so that nursing staff can intervene quickly so as to avoid any risk of bladder injury or pain. Patient stated he understood the education and would call out using his call bell.

## 2021-11-24 DIAGNOSIS — A419 Sepsis, unspecified organism: Secondary | ICD-10-CM | POA: Diagnosis not present

## 2021-11-24 DIAGNOSIS — G4733 Obstructive sleep apnea (adult) (pediatric): Secondary | ICD-10-CM | POA: Diagnosis not present

## 2021-11-24 DIAGNOSIS — H9190 Unspecified hearing loss, unspecified ear: Secondary | ICD-10-CM | POA: Diagnosis not present

## 2021-11-24 DIAGNOSIS — N179 Acute kidney failure, unspecified: Secondary | ICD-10-CM | POA: Diagnosis not present

## 2021-11-24 DIAGNOSIS — R6521 Severe sepsis with septic shock: Secondary | ICD-10-CM | POA: Diagnosis not present

## 2021-11-24 DIAGNOSIS — L89150 Pressure ulcer of sacral region, unstageable: Secondary | ICD-10-CM | POA: Diagnosis not present

## 2021-11-24 DIAGNOSIS — I2510 Atherosclerotic heart disease of native coronary artery without angina pectoris: Secondary | ICD-10-CM | POA: Diagnosis not present

## 2021-11-24 DIAGNOSIS — I10 Essential (primary) hypertension: Secondary | ICD-10-CM | POA: Diagnosis not present

## 2021-11-24 DIAGNOSIS — E1151 Type 2 diabetes mellitus with diabetic peripheral angiopathy without gangrene: Secondary | ICD-10-CM | POA: Diagnosis not present

## 2021-11-24 DIAGNOSIS — E538 Deficiency of other specified B group vitamins: Secondary | ICD-10-CM | POA: Diagnosis not present

## 2021-11-24 DIAGNOSIS — I252 Old myocardial infarction: Secondary | ICD-10-CM | POA: Diagnosis not present

## 2021-11-24 DIAGNOSIS — J449 Chronic obstructive pulmonary disease, unspecified: Secondary | ICD-10-CM | POA: Diagnosis not present

## 2021-11-24 DIAGNOSIS — Z7984 Long term (current) use of oral hypoglycemic drugs: Secondary | ICD-10-CM | POA: Diagnosis not present

## 2021-11-24 DIAGNOSIS — E1122 Type 2 diabetes mellitus with diabetic chronic kidney disease: Secondary | ICD-10-CM | POA: Diagnosis not present

## 2021-11-24 DIAGNOSIS — R1030 Lower abdominal pain, unspecified: Secondary | ICD-10-CM | POA: Diagnosis not present

## 2021-11-24 DIAGNOSIS — Z85038 Personal history of other malignant neoplasm of large intestine: Secondary | ICD-10-CM | POA: Diagnosis not present

## 2021-11-24 DIAGNOSIS — N390 Urinary tract infection, site not specified: Secondary | ICD-10-CM | POA: Diagnosis not present

## 2021-11-24 DIAGNOSIS — E559 Vitamin D deficiency, unspecified: Secondary | ICD-10-CM | POA: Diagnosis not present

## 2021-11-24 DIAGNOSIS — Z466 Encounter for fitting and adjustment of urinary device: Secondary | ICD-10-CM | POA: Diagnosis not present

## 2021-11-24 DIAGNOSIS — N1832 Chronic kidney disease, stage 3b: Secondary | ICD-10-CM | POA: Diagnosis not present

## 2021-11-24 DIAGNOSIS — Z794 Long term (current) use of insulin: Secondary | ICD-10-CM | POA: Diagnosis not present

## 2021-11-24 DIAGNOSIS — I130 Hypertensive heart and chronic kidney disease with heart failure and stage 1 through stage 4 chronic kidney disease, or unspecified chronic kidney disease: Secondary | ICD-10-CM | POA: Diagnosis not present

## 2021-11-24 DIAGNOSIS — Z87891 Personal history of nicotine dependence: Secondary | ICD-10-CM | POA: Diagnosis not present

## 2021-11-24 DIAGNOSIS — M6281 Muscle weakness (generalized): Secondary | ICD-10-CM | POA: Diagnosis not present

## 2021-11-24 DIAGNOSIS — E669 Obesity, unspecified: Secondary | ICD-10-CM | POA: Diagnosis not present

## 2021-11-24 DIAGNOSIS — E7800 Pure hypercholesterolemia, unspecified: Secondary | ICD-10-CM | POA: Diagnosis not present

## 2021-11-24 DIAGNOSIS — N401 Enlarged prostate with lower urinary tract symptoms: Secondary | ICD-10-CM | POA: Diagnosis not present

## 2021-11-24 DIAGNOSIS — I5032 Chronic diastolic (congestive) heart failure: Secondary | ICD-10-CM | POA: Diagnosis not present

## 2021-11-24 DIAGNOSIS — E871 Hypo-osmolality and hyponatremia: Secondary | ICD-10-CM

## 2021-11-24 DIAGNOSIS — R531 Weakness: Secondary | ICD-10-CM | POA: Diagnosis not present

## 2021-11-24 DIAGNOSIS — Z951 Presence of aortocoronary bypass graft: Secondary | ICD-10-CM | POA: Diagnosis not present

## 2021-11-24 DIAGNOSIS — I69351 Hemiplegia and hemiparesis following cerebral infarction affecting right dominant side: Secondary | ICD-10-CM | POA: Diagnosis not present

## 2021-11-24 DIAGNOSIS — B952 Enterococcus as the cause of diseases classified elsewhere: Secondary | ICD-10-CM | POA: Diagnosis not present

## 2021-11-24 DIAGNOSIS — E875 Hyperkalemia: Secondary | ICD-10-CM | POA: Diagnosis not present

## 2021-11-24 LAB — BASIC METABOLIC PANEL
Anion gap: 7 (ref 5–15)
BUN: 25 mg/dL — ABNORMAL HIGH (ref 8–23)
CO2: 23 mmol/L (ref 22–32)
Calcium: 9.6 mg/dL (ref 8.9–10.3)
Chloride: 103 mmol/L (ref 98–111)
Creatinine, Ser: 1.51 mg/dL — ABNORMAL HIGH (ref 0.61–1.24)
GFR, Estimated: 46 mL/min — ABNORMAL LOW (ref >60)
Glucose, Bld: 123 mg/dL — ABNORMAL HIGH (ref 70–99)
Potassium: 3.9 mmol/L (ref 3.5–5.1)
Sodium: 133 mmol/L — ABNORMAL LOW (ref 135–145)

## 2021-11-24 LAB — GLUCOSE, CAPILLARY
Glucose-Capillary: 128 mg/dL — ABNORMAL HIGH (ref 70–99)
Glucose-Capillary: 140 mg/dL — ABNORMAL HIGH (ref 70–99)

## 2021-11-24 MED ORDER — ASCORBIC ACID 500 MG PO TABS: 500.0000 mg | ORAL_TABLET | Freq: Every day | ORAL | 0 refills | Status: DC

## 2021-11-24 MED ORDER — INSULIN GLARGINE-YFGN 100 UNIT/ML ~~LOC~~ SOPN: 5.0000 [IU] | PEN_INJECTOR | Freq: Every day | SUBCUTANEOUS | 0 refills | Status: DC

## 2021-11-24 MED ORDER — ROSUVASTATIN CALCIUM 10 MG PO TABS: 10.0000 mg | ORAL_TABLET | Freq: Every day | ORAL | 0 refills | Status: DC

## 2021-11-24 MED ORDER — GLUCERNA SHAKE PO LIQD: 237.0000 mL | Freq: Three times a day (TID) | ORAL | 0 refills | Status: DC

## 2021-11-24 MED ORDER — CLONIDINE HCL 0.1 MG PO TABS
0.1000 mg | ORAL_TABLET | Freq: Two times a day (BID) | ORAL | Status: DC
  Administered 2021-11-24: 0.1 mg via ORAL
  Filled 2021-11-24: qty 1

## 2021-11-24 MED ORDER — AMOXICILLIN 500 MG PO CAPS: 500.0000 mg | ORAL_CAPSULE | Freq: Three times a day (TID) | ORAL | 0 refills | Status: AC

## 2021-11-24 MED ORDER — APIXABAN 2.5 MG PO TABS: 2.5000 mg | ORAL_TABLET | Freq: Two times a day (BID) | ORAL | 0 refills | Status: DC

## 2021-11-24 MED ORDER — INSULIN ASPART 100 UNIT/ML IJ SOLN: INTRAMUSCULAR | 11 refills | Status: DC

## 2021-11-24 MED ORDER — ADULT MULTIVITAMIN W/MINERALS CH: 1.0000 | ORAL_TABLET | Freq: Every day | ORAL | Status: DC

## 2021-11-24 MED ORDER — CARVEDILOL 3.125 MG PO TABS: 3.1250 mg | ORAL_TABLET | Freq: Two times a day (BID) | ORAL | Status: DC

## 2021-11-24 NOTE — Plan of Care (Signed)

## 2021-11-24 NOTE — Care Management Important Message (Signed)
Important Message  Patient Details  Name: Justin Griffith MRN: [id number] Date of Birth: [birth year]   Medicare Important Message Given:  Yes     [name] 11/24/2021, 10:50 AM

## 2021-11-24 NOTE — Plan of Care (Signed)
Problem: Fluid Volume: Goal: Hemodynamic stability will improve 11/24/2021 1006 by Ardelia Mems, RN Outcome: Adequate for Discharge 11/24/2021 0754 by Ardelia Mems, RN Outcome: Progressing   Problem: Clinical Measurements: Goal: Diagnostic test results will improve 11/24/2021 1006 by Ardelia Mems, RN Outcome: Adequate for Discharge 11/24/2021 0754 by Ardelia Mems, RN Outcome: Progressing Goal: Signs and symptoms of infection will decrease 11/24/2021 1006 by Ardelia Mems, RN Outcome: Adequate for Discharge 11/24/2021 0754 by Ardelia Mems, RN Outcome: Progressing   Problem: Respiratory: Goal: Ability to maintain adequate ventilation will improve 11/24/2021 1006 by Ardelia Mems, RN Outcome: Adequate for Discharge 11/24/2021 0754 by Ardelia Mems, RN Outcome: Progressing   Problem: Education: Goal: Knowledge of General Education information will improve Description: Including pain rating scale, medication(s)/side effects and non-pharmacologic comfort measures 11/24/2021 1006 by Ardelia Mems, RN Outcome: Adequate for Discharge 11/24/2021 0754 by Ardelia Mems, RN Outcome: Progressing   Problem: Health Behavior/Discharge Planning: Goal: Ability to manage health-related needs will improve 11/24/2021 1006 by Ardelia Mems, RN Outcome: Adequate for Discharge 11/24/2021 0754 by Ardelia Mems, RN Outcome: Progressing   Problem: Clinical Measurements: Goal: Ability to maintain clinical measurements within normal limits will improve 11/24/2021 1006 by Ardelia Mems, RN Outcome: Adequate for Discharge 11/24/2021 0754 by Ardelia Mems, RN Outcome: Progressing Goal: Will remain free from infection 11/24/2021 1006 by Ardelia Mems, RN Outcome: Adequate for Discharge 11/24/2021 0754 by Ardelia Mems, RN Outcome: Progressing Goal: Diagnostic test results will improve 11/24/2021 1006 by Ardelia Mems,  RN Outcome: Adequate for Discharge 11/24/2021 0754 by Ardelia Mems, RN Outcome: Progressing Goal: Respiratory complications will improve 11/24/2021 1006 by Ardelia Mems, RN Outcome: Adequate for Discharge 11/24/2021 0754 by Ardelia Mems, RN Outcome: Progressing Goal: Cardiovascular complication will be avoided 11/24/2021 1006 by Ardelia Mems, RN Outcome: Adequate for Discharge 11/24/2021 0754 by Ardelia Mems, RN Outcome: Progressing   Problem: Activity: Goal: Risk for activity intolerance will decrease 11/24/2021 1006 by Ardelia Mems, RN Outcome: Adequate for Discharge 11/24/2021 0754 by Ardelia Mems, RN Outcome: Progressing   Problem: Nutrition: Goal: Adequate nutrition will be maintained 11/24/2021 1006 by Ardelia Mems, RN Outcome: Adequate for Discharge 11/24/2021 0754 by Ardelia Mems, RN Outcome: Progressing   Problem: Coping: Goal: Level of anxiety will decrease 11/24/2021 1006 by Ardelia Mems, RN Outcome: Adequate for Discharge 11/24/2021 0754 by Ardelia Mems, RN Outcome: Progressing   Problem: Elimination: Goal: Will not experience complications related to bowel motility 11/24/2021 1006 by Ardelia Mems, RN Outcome: Adequate for Discharge 11/24/2021 0754 by Ardelia Mems, RN Outcome: Progressing Goal: Will not experience complications related to urinary retention 11/24/2021 1006 by Ardelia Mems, RN Outcome: Adequate for Discharge 11/24/2021 0754 by Ardelia Mems, RN Outcome: Progressing   Problem: Pain Managment: Goal: General experience of comfort will improve 11/24/2021 1006 by Ardelia Mems, RN Outcome: Adequate for Discharge 11/24/2021 0754 by Ardelia Mems, RN Outcome: Progressing   Problem: Safety: Goal: Ability to remain free from injury will improve 11/24/2021 1006 by Ardelia Mems, RN Outcome: Adequate for Discharge 11/24/2021 0754 by Ardelia Mems,  RN Outcome: Progressing   Problem: Skin Integrity: Goal: Risk for impaired skin integrity will decrease 11/24/2021 1006 by Ardelia Mems, RN Outcome: Adequate for Discharge 11/24/2021 0754 by Ardelia Mems, RN Outcome: Progressing   Problem: Education: Goal: Ability to describe self-care measures that may prevent or decrease complications (Diabetes Survival Skills Education) will improve 11/24/2021 1006 by Ardelia Mems, RN Outcome: Adequate for Discharge 11/24/2021 0754 by Ardelia Mems, RN Outcome: Progressing Goal: Individualized Educational Video(s) 11/24/2021 1006 by Ardelia Mems, RN  Outcome: Adequate for Discharge 11/24/2021 0754 by Ardelia Mems, RN Outcome: Progressing   Problem: Coping: Goal: Ability to adjust to condition or change in health will improve 11/24/2021 1006 by Ardelia Mems, RN Outcome: Adequate for Discharge 11/24/2021 0754 by Ardelia Mems, RN Outcome: Progressing   Problem: Fluid Volume: Goal: Ability to maintain a balanced intake and output will improve 11/24/2021 1006 by Ardelia Mems, RN Outcome: Adequate for Discharge 11/24/2021 0754 by Ardelia Mems, RN Outcome: Progressing   Problem: Health Behavior/Discharge Planning: Goal: Ability to identify and utilize available resources and services will improve 11/24/2021 1006 by Ardelia Mems, RN Outcome: Adequate for Discharge 11/24/2021 0754 by Ardelia Mems, RN Outcome: Progressing Goal: Ability to manage health-related needs will improve 11/24/2021 1006 by Ardelia Mems, RN Outcome: Adequate for Discharge 11/24/2021 0754 by Ardelia Mems, RN Outcome: Progressing   Problem: Metabolic: Goal: Ability to maintain appropriate glucose levels will improve 11/24/2021 1006 by Ardelia Mems, RN Outcome: Adequate for Discharge 11/24/2021 0754 by Ardelia Mems, RN Outcome: Progressing   Problem: Nutritional: Goal: Maintenance of  adequate nutrition will improve 11/24/2021 1006 by Ardelia Mems, RN Outcome: Adequate for Discharge 11/24/2021 0754 by Ardelia Mems, RN Outcome: Progressing Goal: Progress toward achieving an optimal weight will improve 11/24/2021 1006 by Ardelia Mems, RN Outcome: Adequate for Discharge 11/24/2021 0754 by Ardelia Mems, RN Outcome: Progressing   Problem: Skin Integrity: Goal: Risk for impaired skin integrity will decrease 11/24/2021 1006 by Ardelia Mems, RN Outcome: Adequate for Discharge 11/24/2021 0754 by Ardelia Mems, RN Outcome: Progressing   Problem: Tissue Perfusion: Goal: Adequacy of tissue perfusion will improve 11/24/2021 1006 by Ardelia Mems, RN Outcome: Adequate for Discharge 11/24/2021 0754 by Ardelia Mems, RN Outcome: Progressing

## 2021-11-24 NOTE — Discharge Summary (Signed)
Physician Discharge Summary   Patient: Justin Griffith MRN: [id number] DOB: April 07, 1940  Admit date:     11/17/2021  Discharge date: 11/24/21  Discharge Physician: Loletha Grayer   PCP: Charlynne Cousins, MD (Inactive)   Recommendations at discharge:   Follow-up team at rehab 1 day Follow-up urology  Discharge Diagnoses: Principal Problem:   Septic shock ([place]) Active Problems:   Lower abdominal pain   Acute lower UTI   Essential hypertension   Acute kidney injury superimposed on chronic kidney disease (HCC)   Dyslipidemia   Hyperkalemia   Lobar pneumonia (HCC)   Obesity (BMI 30-39.9)   GERD without esophagitis   Hyponatremia   Generalized weakness   Type 2 diabetes mellitus with chronic kidney disease, without long-term current use of insulin (HCC)   Pressure injury of sacral region, unstageable [facility])    Hospital Course: Patient was admitted to the hospital on 11/17/2021.  He came in unresponsive and with septic shock.  Patient was started on pressors and IV fluids.  Patient was started on aggressive antibiotics.  Enterococcus grew out of the urine culture.  Antibiotics were switched over to Zosyn and then amoxicillin orally.  His Foley catheter was changed in the emergency room.  The patient was tapered off pressors and started on midodrine.  Midodrine was tapered off.  Patient's blood pressure started to rise and blood pressure medications were restarted.  Patient still very weak and physical therapy recommending rehab.  Patient's creatinine was elevated at 6.39 upon coming into the hospital with an elevated potassium of 6.9.  Creatinine upon discharge 1.51 and potassium 3.9.  Assessment and Plan: * Septic shock ([place]) - Sepsis manifested by leukocytosis and tachypnea and severe sepsis as manifested by hypotension that persistent despite aggressive hydration and therefore he met criteria for septic shock. -Sepsis is likely secondary to Enterococcus UTI. - Off Levophed from  11/19/21 -Discontinued midodrine on 11/21/2021 -Complete total 5 more days of amoxicillin.  Lower abdominal pain Foley catheter needed to be changed on 11/23/2021 because of lower abdominal pain. Abdominal pain resolved after Foley catheter changed.  Follow-up with urology as outpatient.  Change Foley catheter every month.  Acute lower UTI Catheter related Enterococcus UTI.  Initially on Zosyn and now on amoxicillin.  Foley catheter changed in the emergency room and again on 11/23/2021.  Acute kidney injury superimposed on chronic kidney disease ([place]) - This is AKI superimposed on stage IIIb chronic kidney disease.  Patient came in with creatinine of 6.39 and last creatinine down to 1.51 upon discharge.  Essential hypertension Blood pressure medications restarted including clonidine Coreg and hydralazine.  Hyperkalemia Improved with improvement of kidney function.  Dyslipidemia Continue Crestor  Lobar pneumonia ([place]) Pneumonia ruled out.  Repeat chest x-ray read as negative.  Obesity (BMI 30-39.9) BMI 32.93  Pressure injury of sacral region, unstageable ([place]) Skin is intact but an area of blackness.  Hard to stage at this point.  Appreciate general surgery consultation and need to continue to watch this.  Type 2 diabetes mellitus with chronic kidney disease, without long-term current use of insulin (HCC) - Continue supplement coverage with NovoLog.  Use low-dose Semglee insulin 5 units at night.  Hemoglobin A1c elevated 8.1. - We will hold off Glucophage given acute kidney injury  Generalized weakness Walked 20 feet yesterday with physical therapy but still needed quite a bit of assistance and patient got weak while walking.  Physical therapy still recommending rehab.  Hyponatremia Sodium only 2 points lower than the normal  range  GERD without esophagitis Oral PPI         Consultants: Nephrology Procedures performed: Dialysis Disposition: Rehabilitation facility Diet  recommendation:  Cardiac and Carb modified diet DISCHARGE MEDICATION: Allergies as of 11/24/2021       Reactions   Farxiga [dapagliflozin] Rash   Causes a severe rash in groin area   Dulaglutide Diarrhea, Other (See Comments)   Liraglutide Diarrhea, Other (See Comments)   Jardiance [empagliflozin] Rash        Medication List     STOP taking these medications    cephALEXin 500 MG capsule Commonly known as: KEFLEX   fenofibrate 145 MG tablet Commonly known as: Tricor   furosemide 40 MG tablet Commonly known as: LASIX   gabapentin 600 MG tablet Commonly known as: NEURONTIN   lidocaine 5 % Commonly known as: Lidoderm   losartan 50 MG tablet Commonly known as: COZAAR   metFORMIN 1000 MG tablet Commonly known as: GLUCOPHAGE   mirabegron ER 50 MG Tb24 tablet Commonly known as: MYRBETRIQ   nortriptyline 10 MG capsule Commonly known as: PAMELOR   ondansetron 4 MG tablet Commonly known as: ZOFRAN       TAKE these medications    acetaminophen 325 MG tablet Commonly known as: TYLENOL Take 650 mg by mouth every 6 (six) hours as needed.   aluminum-magnesium hydroxide-simethicone 500-938-18 MG/5ML Susp Commonly known as: MAALOX Take 30 mLs by mouth 4 (four) times daily -  before meals and at bedtime.   amoxicillin 500 MG capsule Commonly known as: AMOXIL Take 1 capsule (500 mg total) by mouth every 8 (eight) hours for 5 days.   apixaban 2.5 MG Tabs tablet Commonly known as: ELIQUIS Take 1 tablet (2.5 mg total) by mouth 2 (two) times daily. What changed:  medication strength how much to take   ascorbic acid 500 MG tablet Commonly known as: VITAMIN C Take 1 tablet (500 mg total) by mouth daily.   Breztri Aerosphere 160-9-4.8 MCG/ACT Aero Generic drug: Budeson-Glycopyrrol-Formoterol Inhale 1 puff into the lungs daily.   carvedilol 3.125 MG tablet Commonly known as: COREG Take 1 tablet (3.125 mg total) by mouth 2 (two) times daily with a meal.    cloNIDine 0.1 MG tablet Commonly known as: CATAPRES Take 0.1 mg by mouth 2 (two) times daily.   cyanocobalamin 1000 MCG tablet Take 1 tablet (1,000 mcg total) by mouth at bedtime.   feeding supplement (GLUCERNA SHAKE) Liqd Take 237 mLs by mouth 3 (three) times daily between meals.   hydrALAZINE 50 MG tablet Commonly known as: APRESOLINE TAKE ONE TABLET BY MOUTH THREE TIMES A DAY FOR BLOOD PRESSURE   insulin aspart 100 UNIT/ML injection Commonly known as: novoLOG For glucose 121-150 give 1 unit subcutaneous insulin; 151- 200: 2 units; 201 to 250: 3 units; 251-300: 5 units; 301 to 350: 7 units; 351 and above 9 units.   insulin glargine-yfgn 100 UNIT/ML Pen Commonly known as: SEMGLEE Inject 5 Units into the skin at bedtime. What changed: See the new instructions.   magnesium oxide 400 (240 Mg) MG tablet Commonly known as: MAG-OX Take 1 tablet (400 mg total) by mouth daily.   multivitamin with minerals Tabs tablet Take 1 tablet by mouth daily.   pantoprazole 40 MG tablet Commonly known as: PROTONIX Take 40 mg by mouth daily.   rosuvastatin 10 MG tablet Commonly known as: CRESTOR Take 1 tablet (10 mg total) by mouth at bedtime. What changed:  medication strength how much to take   Spiriva  Respimat 2.5 MCG/ACT Aers Generic drug: Tiotropium Bromide Monohydrate Inhale 2 puffs into the lungs daily.   Vitamin D (Cholecalciferol) 25 MCG (1000 UT) Caps Take 2,000 mcg by mouth at bedtime.        Contact information for after-discharge care     [facility] Preferred SNF .   Service: Skilled Nursing Contact information: [place] [phone]                    Discharge Exam: Danley Danker Weights   11/21/21 0500 11/23/21 0500 11/24/21 0359  Weight: 108.8 kg 106.3 kg 107.1 kg   Physical Exam HENT:     Head: Normocephalic.      Mouth/Throat:     Pharynx: No oropharyngeal exudate.  Eyes:     General: Lids are normal.     Conjunctiva/sclera: Conjunctivae normal.  Cardiovascular:     Rate and Rhythm: Normal rate and regular rhythm.     Heart sounds: Normal heart sounds, S1 normal and S2 normal.  Pulmonary:     Breath sounds: No decreased breath sounds, wheezing, rhonchi or rales.  Abdominal:     Palpations: Abdomen is soft.     Tenderness: There is no abdominal tenderness.  Musculoskeletal:     Right lower leg: Swelling present.     Left lower leg: Swelling present.  Skin:    General: Skin is warm.  Neurological:     Mental Status: He is alert.     Comments: Answers questions appropriately      Condition at discharge: stable  The results of significant diagnostics from this hospitalization (including imaging, microbiology, ancillary and laboratory) are listed below for reference.   Imaging Studies: DG Chest Port 1 View  Result Date: 11/18/2021 CLINICAL DATA:  Removal of malpositioned Dobbhoff tube. Rule out pneumothorax EXAM: PORTABLE CHEST 1 VIEW COMPARISON:  Yesterday FINDINGS: No pneumothorax or acute airspace opacity. Cardiomegaly. Coronary stenting. Scarring or atelectasis at the left lung base similar to prior. IMPRESSION: No acute finding. Unchanged scar or atelectatic type density at the left base. Electronically Signed   By: Jorje Guild M.D.   On: 11/18/2021 07:38   DG Abd 1 View  Result Date: 11/18/2021 CLINICAL DATA:  Confirm Dobbhoff tube placement. EXAM: ABDOMEN - 1 VIEW COMPARISON:  Chest radiograph 11/17/2021 FINDINGS: 0709 hours. The feeding tube is in the left lower lobe airway. Atelectasis is noted in the left base. There are several gaseous distended loops of bowel identified within the abdomen which may reflect ileus versus obstruction. IMPRESSION: 1. Feeding tube is in the left lower lobe airway. Recommend removal and replacement. 2. Left base atelectasis. 3. Gaseous distension of  the bowel loops which may reflect underlying ileus or obstruction Electronically Signed   By: Kerby Moors M.D.   On: 11/18/2021 07:37   DG Abd 1 View  Result Date: 11/18/2021 CLINICAL DATA:  Dobbhoff placement EXAM: ABDOMEN - 1 VIEW COMPARISON:  Earlier today FINDINGS: Left lower lobe intubation by the enteric tube. This is already been corrected based on follow-up radiograph. Curvilinear scar atelectasis at the left base. No edema, effusion, or pneumothorax. Cardiomegaly. IMPRESSION: Left lower lobe airway intubation by enteric tube, already corrected at time of dictation. Electronically Signed   By: Jorje Guild M.D.   On: 11/18/2021 07:36   US RENAL  Result Date: 11/18/2021 CLINICAL DATA:  Acute on chronic renal failure  EXAM: RENAL / URINARY TRACT ULTRASOUND COMPLETE COMPARISON:  CT AP 09/30/2021 FINDINGS: Right Kidney: Renal measurements: 12.8 x 5.8 x 6.2 cm = volume: 240.3 mL. Echogenicity within normal limits. No mass or hydronephrosis visualized. Left Kidney: Renal measurements: 12.7 x 6.1 x 5.1 cm = volume: 202.9 mL. Perinephric fluid noted around the inferior pole. Echogenicity within normal limits. No mass or hydronephrosis visualized. Bladder: Decompressed around a Foley catheter. Other: None. IMPRESSION: 1. No hydronephrosis identified. 2. Mild lower pole left perinephric fluid. Electronically Signed   By: Kerby Moors M.D.   On: 11/18/2021 05:59   CT Head Wo Contrast  Result Date: 11/17/2021 CLINICAL DATA:  Altered mental status EXAM: CT HEAD WITHOUT CONTRAST TECHNIQUE: Contiguous axial images were obtained from the base of the skull through the vertex without intravenous contrast. RADIATION DOSE REDUCTION: This exam was performed according to the departmental dose-optimization program which includes automated exposure control, adjustment of the mA and/or kV according to patient size and/or use of iterative reconstruction technique. COMPARISON:  MRI brain dated 10/31/2021 FINDINGS:  Brain: No evidence of acute infarction, hemorrhage, hydrocephalus, extra-axial collection or mass lesion/mass effect. Subcortical white matter and periventricular small vessel ischemic changes. Old left cerebellar infarct. Vascular: No hyperdense vessel or unexpected calcification. Skull: Normal. Negative for fracture or focal lesion. Sinuses/Orbits: The visualized paranasal sinuses are essentially clear. The mastoid air cells are unopacified. Other: None. IMPRESSION: No evidence of acute intracranial abnormality. Small vessel ischemic changes. Old left cerebellar infarct. Electronically Signed   By: Julian Hy M.D.   On: 11/17/2021 22:23   DG Chest Port 1 View  Result Date: 11/17/2021 CLINICAL DATA:  Questionable sepsis. EXAM: PORTABLE CHEST 1 VIEW COMPARISON:  Chest x-ray 10/31/2021 FINDINGS: There are patchy airspace opacities in the left lung base with small left pleural effusion. There is a band of atelectasis in the right lung base. Cardiomediastinal silhouette is within normal limits. No pneumothorax or acute fracture. IMPRESSION: 1.  Patchy left lower lobe airspace disease worrisome for infection. 2.  Right basilar atelectasis. Electronically Signed   By: Ronney Asters M.D.   On: 11/17/2021 21:53   MR ANGIO HEAD WO CONTRAST  Result Date: 11/01/2021 CLINICAL DATA:  Follow-up examination for stroke. EXAM: MRA HEAD WITHOUT CONTRAST TECHNIQUE: Angiographic images of the Circle of Willis were acquired using MRA technique without intravenous contrast. COMPARISON:  Comparison made with prior brain MRI from 10/31/2021. FINDINGS: Anterior circulation: Visualized distal cervical segments of the internal carotid arteries are widely patent with antegrade flow. Petrous, cavernous, and supraclinoid segments patent without stenosis or other abnormality. ICA termini well perfused. A1 segments patent bilaterally. Normal anterior communicating artery complex. Anterior cerebral arteries patent without stenosis.  No M1 stenosis or occlusion. Normal MCA bifurcations. No proximal MCA branch occlusion. Distal MCA branches perfused and symmetric. Posterior circulation: Both vertebral arteries patent to the vertebrobasilar junction without stenosis. Right vertebral artery dominant. Both PICA patent. Basilar patent to its distal aspect without stenosis. Superior cerebral arteries patent bilaterally. Both PCA supplied via the basilar as well as robust bilateral posterior communicating arteries. Both PCAs patent to their distal aspects without stenosis. Anatomic variants: None significant. Other: No intracranial aneurysm. IMPRESSION: Normal intracranial MRA. No large vessel occlusion. No hemodynamically significant or correctable stenosis. Electronically Signed   By: Jeannine Boga M.D.   On: 11/01/2021 05:16   US Carotid Bilateral (at [facility] and [facility] only)  Result Date: 10/31/2021 CLINICAL DATA:  Stroke symptoms, hypertension, hyperlipidemia and diabetes EXAM: BILATERAL CAROTID DUPLEX ULTRASOUND TECHNIQUE:  Gray scale imaging, color Doppler and duplex ultrasound were performed of bilateral carotid and vertebral arteries in the neck. COMPARISON:  None Available. FINDINGS: Criteria: Quantification of carotid stenosis is based on velocity parameters that correlate the residual internal carotid diameter with NASCET-based stenosis levels, using the diameter of the distal internal carotid lumen as the denominator for stenosis measurement. The following velocity measurements were obtained: RIGHT ICA: 62/16 cm/sec CCA: 91/63 cm/sec SYSTOLIC ICA/CCA RATIO:  1.5 ECA: 149 cm/sec LEFT ICA: 69/19 cm/sec CCA: 84/66 cm/sec SYSTOLIC ICA/CCA RATIO:  1.0 ECA: 97 cm/sec RIGHT CAROTID ARTERY: Intimal thickening and mild bifurcation calcified plaque formation. Negative for significant stenosis, velocity elevation, turbulent flow. Degree of narrowing less than 50% by ultrasound criteria. RIGHT VERTEBRAL ARTERY:  Normal antegrade flow LEFT CAROTID  ARTERY: Similar bifurcation calcified atherosclerosis. Despite this, no significant stenosis, velocity elevation, turbulent flow. Degree of narrowing also less than 50% by ultrasound criteria. LEFT VERTEBRAL ARTERY:  Normal antegrade flow IMPRESSION: Bilateral carotid atherosclerosis. Negative for significant stenosis. Degree of narrowing less than 50% bilaterally by ultrasound criteria. Patent antegrade vertebral flow bilaterally Electronically Signed   By: Jerilynn Mages.  Shick M.D.   On: 10/31/2021 11:27   MR BRAIN WO CONTRAST  Result Date: 10/31/2021 CLINICAL DATA:  Initial evaluation for acute neuro deficit, stroke. EXAM: MRI HEAD WITHOUT CONTRAST TECHNIQUE: Multiplanar, multiecho pulse sequences of the brain and surrounding structures were obtained without intravenous contrast. COMPARISON:  CT from 10/30/2021. FINDINGS: Brain: Examination mildly degraded by motion artifact. Diffuse prominence of the CSF containing spaces compatible generalized cerebral atrophy. Patchy and confluent T2/FLAIR hyperintensity involving the periventricular and deep white matter both cerebral hemispheres, most consistent with chronic small vessel ischemic disease, moderately advanced in nature. Mild involvement of the pons noted. Encephalomalacia of involving the inferior left cerebellum consistent with a remote left PICA distribution infarct. Approximate 1.3 cm area of mild diffusion signal abnormality involving the deep white matter of the left frontal corona radiata is seen (series 5, image 31). Associated T2/FLAIR signal intensity without convincing ADC correlate. This likely reflects an evolving subacute small vessel infarct. No associated hemorrhage or mass effect. Additionally, there is a possible 4 mm focus of restricted diffusion involving the mesial right temporal lobe, seen only on coronal DWI sequence (series 7, image 23), which could reflect an additional small acute to early subacute ischemic infarct. No associated hemorrhage  or mass effect. No other evidence for acute or subacute ischemia. Gray-white matter differentiation otherwise maintained. No other significant acute or chronic intracranial blood products. No mass lesion, midline shift or mass effect. Mild ventricular prominence related to global parenchymal volume loss without hydrocephalus. No extra-axial fluid collection. Pituitary gland and suprasellar region within normal limits. Vascular: Major intracranial vascular flow voids are maintained. Skull and upper cervical spine: Craniocervical junction within normal limits. Bone marrow signal intensity grossly normal. No scalp soft tissue abnormality. Sinuses/Orbits: Prior bilateral ocular lens replacement. Paranasal sinuses are largely clear. Small chronic appearing bilateral mastoid effusions. Visualized nasopharynx unremarkable. Other: None. IMPRESSION: 1. 1.3 cm focus of mild diffusion signal abnormality involving the deep white matter of the left frontal corona radiata, likely an evolving subacute small vessel infarct. No associated hemorrhage or mass effect. 2. Additional 4 mm focus of possible restricted diffusion involving the mesial right temporal lobe, suspicious for an additional small acute to early subacute nonhemorrhagic ischemic infarct. 3. Underlying age-related cerebral atrophy with moderate chronic microvascular ischemic disease. 4. Remote left PICA distribution infarct. Electronically Signed   By: Jeannine Boga  M.D.   On: 10/31/2021 03:03   DG Knee Complete 4 Views Right  Result Date: 10/31/2021 CLINICAL DATA:  Fall injury with bilateral knee and bilateral ankle pain and chest discomfort. EXAM: RIGHT KNEE - COMPLETE 4+ VIEW; CHEST - 2 VIEW; LEFT ANKLE COMPLETE - 3+ VIEW; RIGHT ANKLE - COMPLETE 3+ VIEW; LEFT KNEE - COMPLETE 4+ VIEW COMPARISON:  Portable chest 09/19/2021, bilateral knee series 07/29/2006. No prior ankle series either side. FINDINGS: Chest: Mild cardiomegaly without evidence of CHF.  Stable mediastinal silhouette with slight aortic tortuosity, calcification in the arch. The lungs are clear. No pleural effusion is seen. Moderate thoracic spondylosis. Right knee: Normal bone mineralization.  No fracture or dislocation is seen. Small suprapatellar effusion is noted and a total knee arthroplasty without evidence of loosening. There are multiple small ossific rounded loose bodies along the joint margins versus extra-articular bony debris, not seen on the prior postoperative study. There is mild soft tissue swelling anteriorly. Patchy calcification newly noted in the distal femoral artery, popliteal and popliteal trifurcation arteries. Left knee: There is normal bone mineralization without evidence of fractures. Small suprapatellar effusion is seen. Total joint arthroplasty is intact without evidence of loosening. There are multiple small rounded ossific bodies scattered around the periphery of the joint space versus extra-articular ossific debris not seen previously. The superficial soft tissues are unremarkable. Patchy calcification developed since the prior study in the distal femoral, popliteal and popliteal trifurcation arteries. Right ankle: There is mild to moderate swelling. There are calcifications in the anterior and posterior tibial arteries. Normal bone mineralization without evidence of acute fracture. The mortise is symmetric but there is spurring of the undersurface of both malleoli. There is an ovoid ossicle inferior to the medial malleolus which could be an old chip fracture, intra-articular loose body or unfused ossification center. 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 No proximal metatarsal fracture seen. Left ankle: There is mild generalized soft tissue fullness. There are calcifications in the anterior and posterior tibial arteries. A chronic chip fracture with nonunion is noted of the lateral malleolar  tip but there is no evidence of acute fracture. The mortise is symmetric. There is slight spurring of the undersurface of both malleoli. Bony debris is seen inferior to the lateral malleolus with chronic appearance and could be dystrophic calcifications from remote trauma or loose bodies. 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 The proximal metatarsals are intact as visualized. IMPRESSION: 1. No evidence of acute chest disease. Stable chest with mild cardiomegaly and aortic atherosclerosis. 2. No evidence of acute fractures of the bilateral knees and bilateral ankles. 3. Anterior swelling of the right knee and bilateral swelling at the ankles right-greater-than-left. 4. Small bilateral suprapatellar effusions. 5. Postsurgical and degenerative changes described above. 6. Interval development of vascular calcifications compared with 15 years ago. Electronically Signed   By: Telford Nab M.D.   On: 10/31/2021 01:03   DG Knee Complete 4 Views Left  Result Date: 10/31/2021 CLINICAL DATA:  Fall injury with bilateral knee and bilateral ankle pain and chest discomfort. EXAM: RIGHT KNEE - COMPLETE 4+ VIEW; CHEST - 2 VIEW; LEFT ANKLE COMPLETE - 3+ VIEW; RIGHT ANKLE - COMPLETE 3+ VIEW; LEFT KNEE - COMPLETE 4+ VIEW COMPARISON:  Portable chest 09/19/2021, bilateral knee series 07/29/2006. No prior ankle series either side. FINDINGS: Chest: Mild cardiomegaly without evidence of CHF. Stable mediastinal silhouette with slight aortic tortuosity, calcification in the arch. The  lungs are clear. No pleural effusion is seen. Moderate thoracic spondylosis. Right knee: Normal bone mineralization.  No fracture or dislocation is seen. Small suprapatellar effusion is noted and a total knee arthroplasty without evidence of loosening. There are multiple small ossific rounded loose bodies along the joint margins versus extra-articular bony debris, not seen on the prior  postoperative study. There is mild soft tissue swelling anteriorly. Patchy calcification newly noted in the distal femoral artery, popliteal and popliteal trifurcation arteries. Left knee: There is normal bone mineralization without evidence of fractures. Small suprapatellar effusion is seen. Total joint arthroplasty is intact without evidence of loosening. There are multiple small rounded ossific bodies scattered around the periphery of the joint space versus extra-articular ossific debris not seen previously. The superficial soft tissues are unremarkable. Patchy calcification developed since the prior study in the distal femoral, popliteal and popliteal trifurcation arteries. Right ankle: There is mild to moderate swelling. There are calcifications in the anterior and posterior tibial arteries. Normal bone mineralization without evidence of acute fracture. The mortise is symmetric but there is spurring of the undersurface of both malleoli. There is an ovoid ossicle inferior to the medial malleolus which could be an old chip fracture, intra-articular loose body or unfused ossification center. 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 No proximal metatarsal fracture seen. Left ankle: There is mild generalized soft tissue fullness. There are calcifications in the anterior and posterior tibial arteries. 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 The mortise is symmetric. There is slight spurring of the undersurface of both malleoli. Bony debris is seen inferior to the lateral malleolus with chronic appearance and could be dystrophic calcifications from remote trauma or loose bodies. 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 The proximal metatarsals are intact as visualized. IMPRESSION: 1. No evidence of  acute chest disease. Stable chest with mild cardiomegaly and aortic atherosclerosis. 2. No evidence of acute fractures of the bilateral knees and bilateral ankles. 3. Anterior swelling of the right knee and bilateral swelling at the ankles right-greater-than-left. 4. Small bilateral suprapatellar effusions. 5. Postsurgical and degenerative changes described above. 6. Interval development of vascular calcifications compared with 15 years ago. Electronically Signed   By: Telford Nab M.D.   On: 10/31/2021 01:03   DG Ankle Complete Right  Result Date: 10/31/2021 CLINICAL DATA:  Fall injury with bilateral knee and bilateral ankle pain and chest discomfort. EXAM: RIGHT KNEE - COMPLETE 4+ VIEW; CHEST - 2 VIEW; LEFT ANKLE COMPLETE - 3+ VIEW; RIGHT ANKLE - COMPLETE 3+ VIEW; LEFT KNEE - COMPLETE 4+ VIEW COMPARISON:  Portable chest 09/19/2021, bilateral knee series 07/29/2006. No prior ankle series either side. FINDINGS: Chest: Mild cardiomegaly without evidence of CHF. Stable mediastinal silhouette with slight aortic tortuosity, calcification in the arch. The lungs are clear. No pleural effusion is seen. Moderate thoracic spondylosis. Right knee: Normal bone mineralization.  No fracture or dislocation is seen. Small suprapatellar effusion is noted and a total knee arthroplasty without evidence of loosening. There are multiple small ossific rounded loose bodies along the joint margins versus extra-articular bony debris, not seen on the prior postoperative study. There is mild soft tissue swelling anteriorly. Patchy calcification newly noted in the distal femoral artery, popliteal and popliteal trifurcation arteries. Left knee: There is normal bone mineralization without evidence of fractures. Small suprapatellar effusion is seen. Total joint  arthroplasty is intact without evidence of loosening. There are multiple small rounded ossific bodies scattered around the periphery of the joint space versus extra-articular  ossific debris not seen previously. The superficial soft tissues are unremarkable. Patchy calcification developed since the prior study in the distal femoral, popliteal and popliteal trifurcation arteries. Right ankle: There is mild to moderate swelling. There are calcifications in the anterior and posterior tibial arteries. Normal bone mineralization without evidence of acute fracture. The mortise is symmetric but there is spurring of the undersurface of both malleoli. There is an ovoid ossicle inferior to the medial malleolus which could be an old chip fracture, intra-articular loose body or unfused ossification center. 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 No proximal metatarsal fracture seen. Left ankle: There is mild generalized soft tissue fullness. There are calcifications in the anterior and posterior tibial arteries. 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 The mortise is symmetric. There is slight spurring of the undersurface of both malleoli. Bony debris is seen inferior to the lateral malleolus with chronic appearance and could be dystrophic calcifications from remote trauma or loose bodies. 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 The proximal metatarsals are intact as visualized. IMPRESSION: 1. No evidence of acute chest disease. Stable chest with mild cardiomegaly and aortic atherosclerosis. 2. No evidence of acute fractures of the bilateral knees and bilateral ankles. 3. Anterior swelling of the right knee and bilateral swelling at the ankles right-greater-than-left. 4. Small bilateral suprapatellar effusions. 5. Postsurgical and degenerative changes described above. 6. Interval development of vascular calcifications compared with 15 years ago. Electronically Signed   By: Telford Nab M.D.   On: 10/31/2021 01:03   DG Ankle Complete Left  Result Date: 10/31/2021 CLINICAL DATA:  Fall injury with bilateral knee and bilateral ankle pain and chest discomfort. EXAM: RIGHT KNEE - COMPLETE 4+ VIEW; CHEST - 2 VIEW; LEFT ANKLE COMPLETE - 3+ VIEW; RIGHT ANKLE - COMPLETE 3+ VIEW; LEFT KNEE - COMPLETE 4+ VIEW COMPARISON:  Portable chest 09/19/2021, bilateral knee series 07/29/2006. No prior ankle series either side. FINDINGS: Chest: Mild cardiomegaly without evidence of CHF. Stable mediastinal silhouette with slight aortic tortuosity, calcification in the arch. The lungs are clear. No pleural effusion is seen. Moderate thoracic spondylosis. Right knee: Normal bone mineralization.  No fracture or dislocation is seen. Small suprapatellar effusion is noted and a total knee arthroplasty without evidence of loosening. There are multiple small ossific rounded loose bodies along the joint margins versus extra-articular bony debris, not seen on the prior postoperative study. There is mild soft tissue swelling anteriorly. Patchy calcification newly noted in the distal femoral artery, popliteal and popliteal trifurcation arteries. Left knee: There is normal bone mineralization without evidence of fractures. Small suprapatellar effusion is seen. Total joint arthroplasty is intact without evidence of loosening. There are multiple small rounded ossific bodies scattered around the periphery of the joint space versus extra-articular ossific debris not seen previously. The superficial soft tissues are unremarkable. Patchy calcification developed since the prior study in the distal femoral, popliteal and popliteal trifurcation arteries. Right ankle: There is mild to moderate swelling. There are calcifications in the anterior and posterior tibial arteries. Normal bone mineralization without evidence of acute fracture. The mortise is symmetric but there is spurring of the undersurface of both malleoli. There is an  ovoid ossicle inferior to  the medial malleolus which could be an old chip fracture, intra-articular loose body or unfused ossification center. 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 No proximal metatarsal fracture seen. Left ankle: There is mild generalized soft tissue fullness. There are calcifications in the anterior and posterior tibial arteries. 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 The mortise is symmetric. There is slight spurring of the undersurface of both malleoli. Bony debris is seen inferior to the lateral malleolus with chronic appearance and could be dystrophic calcifications from remote trauma or loose bodies. 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 The proximal metatarsals are intact as visualized. IMPRESSION: 1. No evidence of acute chest disease. Stable chest with mild cardiomegaly and aortic atherosclerosis. 2. No evidence of acute fractures of the bilateral knees and bilateral ankles. 3. Anterior swelling of the right knee and bilateral swelling at the ankles right-greater-than-left. 4. Small bilateral suprapatellar effusions. 5. Postsurgical and degenerative changes described above. 6. Interval development of vascular calcifications compared with 15 years ago. Electronically Signed   By: Telford Nab M.D.   On: 10/31/2021 01:03   DG Chest 2 View  Result Date: 10/31/2021 CLINICAL DATA:  Fall injury with bilateral knee and bilateral ankle pain and chest discomfort. EXAM: RIGHT KNEE - COMPLETE 4+ VIEW; CHEST - 2 VIEW; LEFT ANKLE COMPLETE - 3+ VIEW; RIGHT ANKLE - COMPLETE 3+ VIEW; LEFT KNEE - COMPLETE 4+ VIEW COMPARISON:  Portable chest 09/19/2021, bilateral knee series 07/29/2006. No prior ankle series either side. FINDINGS: Chest: Mild cardiomegaly without evidence of  CHF. Stable mediastinal silhouette with slight aortic tortuosity, calcification in the arch. The lungs are clear. No pleural effusion is seen. Moderate thoracic spondylosis. Right knee: Normal bone mineralization.  No fracture or dislocation is seen. Small suprapatellar effusion is noted and a total knee arthroplasty without evidence of loosening. There are multiple small ossific rounded loose bodies along the joint margins versus extra-articular bony debris, not seen on the prior postoperative study. There is mild soft tissue swelling anteriorly. Patchy calcification newly noted in the distal femoral artery, popliteal and popliteal trifurcation arteries. Left knee: There is normal bone mineralization without evidence of fractures. Small suprapatellar effusion is seen. Total joint arthroplasty is intact without evidence of loosening. There are multiple small rounded ossific bodies scattered around the periphery of the joint space versus extra-articular ossific debris not seen previously. The superficial soft tissues are unremarkable. Patchy calcification developed since the prior study in the distal femoral, popliteal and popliteal trifurcation arteries. Right ankle: There is mild to moderate swelling. There are calcifications in the anterior and posterior tibial arteries. Normal bone mineralization without evidence of acute fracture. The mortise is symmetric but there is spurring of the undersurface of both malleoli. There is an ovoid ossicle inferior to the medial malleolus which could be an old chip fracture, intra-articular loose body or unfused ossification center. 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 No proximal metatarsal fracture seen. Left ankle: There is mild generalized soft tissue fullness. There are calcifications in the anterior and posterior tibial arteries. A chronic chip fracture with nonunion is noted of the lateral  malleolar tip but there is no evidence of acute fracture. The mortise is symmetric. There is slight spurring of the undersurface of both malleoli. Bony debris is seen inferior to the lateral malleolus  with chronic appearance and could be dystrophic calcifications from remote trauma or loose bodies. 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 The proximal metatarsals are intact as visualized. IMPRESSION: 1. No evidence of acute chest disease. Stable chest with mild cardiomegaly and aortic atherosclerosis. 2. No evidence of acute fractures of the bilateral knees and bilateral ankles. 3. Anterior swelling of the right knee and bilateral swelling at the ankles right-greater-than-left. 4. Small bilateral suprapatellar effusions. 5. Postsurgical and degenerative changes described above. 6. Interval development of vascular calcifications compared with 15 years ago. Electronically Signed   By: Telford Nab M.D.   On: 10/31/2021 01:03   CT Maxillofacial Wo Contrast  Result Date: 10/30/2021 CLINICAL DATA:  Facial trauma.  Fall.  Patient on Eliquis. EXAM: CT MAXILLOFACIAL WITHOUT CONTRAST TECHNIQUE: Multidetector CT imaging of the maxillofacial structures was performed. Multiplanar CT image reconstructions were also generated. RADIATION DOSE REDUCTION: This exam was performed according to the departmental dose-optimization program which includes automated exposure control, adjustment of the mA and/or kV according to patient size and/or use of iterative reconstruction technique. COMPARISON:  CT head without contrast 09/19/2021 FINDINGS: Osseous: No acute fractures are present. The mandible is intact and located. The patient is edentulous. Multilevel degenerative changes are again noted in the upper cervical spine. No acute fracture is present. Orbits: Bilateral lens replacements are noted. Globes and orbits are otherwise unremarkable. Sinuses: The  paranasal sinuses and mastoid air cells are clear. Soft tissues: Supraorbital soft tissue swelling is present bilaterally. No underlying fracture is present. No foreign body is present. Limited intracranial: Moderate generalized atrophy and white matter disease is stable. IMPRESSION: 1. Supraorbital soft tissue swelling bilaterally without underlying fracture. 2. No other acute trauma to the face. 3. Stable atrophy and white matter disease. 4. Multilevel degenerative changes in the upper cervical spine. Electronically Signed   By: San Morelle M.D.   On: 10/30/2021 20:48   CT Cervical Spine Wo Contrast  Result Date: 10/30/2021 CLINICAL DATA:  Trauma, fall EXAM: CT CERVICAL SPINE WITHOUT CONTRAST TECHNIQUE: Multidetector CT imaging of the cervical spine was performed without intravenous contrast. Multiplanar CT image reconstructions were also generated. RADIATION DOSE REDUCTION: This exam was performed according to the departmental dose-optimization program which includes automated exposure control, adjustment of the mA and/or kV according to patient size and/or use of iterative reconstruction technique. COMPARISON:  None Available. FINDINGS: Alignment: Alignment of the posterior margin of vertebral bodies is unremarkable. Skull base and vertebrae: No recent fracture is seen. Degenerative changes are noted. Soft tissues and spinal canal: There is extrinsic pressure over the ventral margin of thecal sac caused by posterior bony spurs and bulging of the annulus with mild-to-moderate spinal stenosis, more so at C4-C5 and C5-C6 levels. Disc levels: There is encroachment of neural foramina from C2 to C7 levels. Upper chest: Unremarkable. Other: Unremarkable. IMPRESSION: No recent fracture is seen in cervical spine. Cervical spondylosis with encroachment of neural foramina from C2-C7 levels. Posterior bony spurs are causing extrinsic pressure over the ventral margin of thecal sac with spinal stenosis, more so  at C4-C5 and C5-C6 levels. Electronically Signed   By: Elmer Picker M.D.   On: 10/30/2021 20:47   CT HEAD WO CONTRAST (5MM)  Result Date: 10/30/2021 CLINICAL DATA:  Trauma, fall, altered mental status EXAM: CT HEAD WITHOUT CONTRAST TECHNIQUE: Contiguous axial images were obtained from the base of the skull through the vertex without intravenous contrast. RADIATION DOSE REDUCTION: This exam was performed according  to the departmental dose-optimization program which includes automated exposure control, adjustment of the mA and/or kV according to patient size and/or use of iterative reconstruction technique. COMPARISON:  09/19/2021 FINDINGS: Brain: No acute intracranial findings are seen in noncontrast CT brain. There are no signs of bleeding within the cranium. Cortical sulci are prominent. There is decreased density in periventricular and subcortical white matter. There is encephalomalacia in the posterior left cerebellum with no significant interval change. Vascular: Scattered arterial calcifications are seen. Skull: No fracture is seen in the calvarium. Sinuses/Orbits: There are no air-fluid levels in paranasal sinuses. There is decrease in number of air cells and fluid attenuation in mastoid air cells on both sides with no significant interval change. Other: No significant interval changes are noted. IMPRESSION: No acute intracranial findings are seen in noncontrast CT brain. Atrophy. Small vessel disease. 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 Electronically Signed   By: Elmer Picker M.D.   On: 10/30/2021 20:44    Microbiology: Results for orders placed or performed during the hospital encounter of 11/17/21  Blood Culture (routine x 2)     Status: None   Collection Time: 11/17/21  9:42 PM   Specimen: BLOOD  Result Value Ref Range Status   Specimen Description BLOOD LEFT [facility]  Final   Special Requests   Final    BOTTLES DRAWN AEROBIC AND ANAEROBIC Blood Culture  adequate volume   Culture   Final    NO GROWTH 5 DAYS Performed at [facility], [address] [place], [place] [address]    Report Status 11/22/2021 FINAL  Final  Urine Culture     Status: Abnormal   Collection Time: 11/17/21  9:42 PM   Specimen: In/Out Cath Urine  Result Value Ref Range Status   Specimen Description   Final    IN/OUT CATH URINE Performed at [facility], [address] [place], [place] [address]    Special Requests   Final    NONE Performed at [facility], [address] [place], [place] [address]    Culture (A)  Final    >=100,000 COLONIES/mL ENTEROCOCCUS FAECALIS 10,000 COLONIES/mL STAPHYLOCOCCUS HAEMOLYTICUS    Report Status 11/21/2021 FINAL  Final   Organism ID, Bacteria ENTEROCOCCUS FAECALIS (A)  Final   Organism ID, Bacteria STAPHYLOCOCCUS HAEMOLYTICUS (A)  Final      Susceptibility   Enterococcus faecalis - MIC*    AMPICILLIN <=2 SENSITIVE Sensitive     NITROFURANTOIN <=16 SENSITIVE Sensitive     VANCOMYCIN 1 SENSITIVE Sensitive     * >=100,000 COLONIES/mL ENTEROCOCCUS FAECALIS   Staphylococcus haemolyticus - MIC*    CIPROFLOXACIN >=8 RESISTANT Resistant     GENTAMICIN >=16 RESISTANT Resistant     NITROFURANTOIN <=16 SENSITIVE Sensitive     OXACILLIN >=4 RESISTANT Resistant     TETRACYCLINE <=1 SENSITIVE Sensitive     VANCOMYCIN 1 SENSITIVE Sensitive     TRIMETH/SULFA >=320 RESISTANT Resistant     CLINDAMYCIN >=8 RESISTANT Resistant     RIFAMPIN <=0.5 SENSITIVE Sensitive     Inducible Clindamycin NEGATIVE Sensitive     * 10,000 COLONIES/mL STAPHYLOCOCCUS HAEMOLYTICUS  Blood Culture (routine x 2)     Status: None   Collection Time: 11/17/21  9:59 PM   Specimen: BLOOD  Result Value Ref Range Status   Specimen Description BLOOD RIGHT FA  Final   Special Requests   Final    BOTTLES DRAWN AEROBIC AND ANAEROBIC Blood Culture adequate volume   Culture  Final    NO GROWTH 5 DAYS Performed at [facility], [address] [place], [place] [address]    Report Status 11/22/2021 FINAL  Final  MRSA Next Gen by PCR, Nasal     Status: None   Collection Time: 11/18/21  4:49 AM   Specimen: Nasal Mucosa; Nasal Swab  Result Value Ref Range Status   MRSA by PCR Next Gen NOT DETECTED NOT DETECTED Final    Comment: (NOTE) The GeneXpert MRSA Assay (FDA approved for NASAL specimens only), is one component of a comprehensive MRSA colonization surveillance program. It is not intended to diagnose MRSA infection nor to guide or monitor treatment for MRSA infections. Test performance is not FDA approved in patients less than 82 years old. Performed at [facility], [address] [place], [place] [address]     Labs: CBC: Recent Labs  Lab 11/17/21 2142 11/18/21 0329 11/19/21 0147 11/20/21 0439 11/21/21 0529  WBC 14.2* 12.7* 8.9 6.6  --   NEUTROABS 11.0*  --  6.2 4.3  --   HGB 12.5* 10.3* 10.0* 9.9* 9.7*  HCT 39.9 32.9* 31.1* 30.3*  --   MCV 93.7 92.4 90.4 90.7  --   PLT 280 244 258 249  --    Basic Metabolic Panel: Recent Labs  Lab 11/18/21 0329 11/18/21 1250 11/19/21 0147 11/20/21 0439 11/21/21 0529 11/22/21 0607 11/24/21 0517  NA 135   < > 138 140 139 138 133*  K 5.9*   < > 5.4* 4.7 4.8 4.4 3.9  CL 103   < > 106 106 108 109 103  CO2 23   < > _0 GLUCOSE 134*   < > 100* 130* 182* 134* 123*  BUN 109*   < > 99* 71* 47* 28* 25*  CREATININE 5.91*   < > 5.60* 3.58* 2.33* 1.46* 1.51*  CALCIUM 9.1   < > 9.2 9.2 9.1 9.3 9.6  MG 2.3  --  2.3 2.1  --   --   --   PHOS 4.7*  --  3.7 2.6  --   --   --    < > = values in this interval not displayed.   Liver Function Tests: Recent Labs  Lab 11/17/21 2142  AST 26  ALT 13  ALKPHOS 39  BILITOT 0.5  PROT 6.9  ALBUMIN 3.0*   CBG: Recent Labs  Lab 11/23/21 1145 11/23/21 1726 11/23/21 1945 11/24/21 0358 11/24/21 0731  GLUCAP 189* 145* 139* 128* 140*    Discharge time spent: greater than 30  minutes.  Signed: Loletha Grayer, MD [facility] 11/24/2021

## 2021-11-24 NOTE — Assessment & Plan Note (Signed)
Sodium only 2 points lower than the normal range

## 2021-11-24 NOTE — TOC Progression Note (Addendum)
Transition of Care [facility]) - Progression Note    Patient Details  Name: THOS MATSUMOTO MRN: [id number] Date of Birth: [birth year]  Transition of Care [facility]) CM/SW [place], RN Phone Number: 11/24/2021, 9:47 AM  Clinical Narrative:    Durene Cal DC summary to [place] to room 506 Patient is aware and will notify his family of the room number, they have already completed the paperwork at [facility]  EMS called to transport He is first up  Expected Discharge Plan: [place] Barriers to Discharge: Continued Medical Work up  Expected Discharge Plan and Services Expected Discharge Plan: [place]   Discharge Planning Services: CM Consult Post Acute Care Choice: [place] Living arrangements for the past 2 months: Single Family Home Expected Discharge Date: 11/24/21                                     Social Determinants of Health (SDOH) Interventions    Readmission Risk Interventions    09/21/2021   10:42 AM 05/21/2020   10:01 AM 05/18/2020   12:17 PM  Readmission Risk Prevention Plan  Transportation Screening Complete Complete Complete  PCP or Specialist Appt within 3-5 Days Complete Complete   HRI or Home Care Consult Complete Complete Complete  Social Work Consult for [place] Planning/Counseling Complete Complete Complete  Palliative Care Screening Not Applicable Not Applicable Not Applicable  Medication Review [employer]) Complete Complete Complete

## 2021-11-26 ENCOUNTER — Inpatient Hospital Stay: Admission: RE | Admit: 2021-11-26 | Payer: HMO | Source: Ambulatory Visit

## 2021-11-28 ENCOUNTER — Telehealth: Payer: Self-pay | Admitting: Interventional Radiology

## 2021-11-28 NOTE — Telephone Encounter (Signed)
Spoke with patient's daughter/Patient unable to make appointment on 11/26/21-currently in rehab/aware we will schedule in October 2023.

## 2021-11-29 ENCOUNTER — Other Ambulatory Visit: Payer: Self-pay

## 2021-11-29 ENCOUNTER — Encounter: Payer: Self-pay | Admitting: Emergency Medicine

## 2021-11-29 ENCOUNTER — Emergency Department
Admission: EM | Admit: 2021-11-29 | Discharge: 2021-11-29 | Disposition: A | Discharge status: Home / Self Care | Payer: HMO | Attending: Emergency Medicine | Admitting: Emergency Medicine

## 2021-11-29 DIAGNOSIS — L89159 Pressure ulcer of sacral region, unspecified stage: Secondary | ICD-10-CM | POA: Insufficient documentation

## 2021-11-29 DIAGNOSIS — I10 Essential (primary) hypertension: Secondary | ICD-10-CM | POA: Diagnosis not present

## 2021-11-29 DIAGNOSIS — L98421 Non-pressure chronic ulcer of back limited to breakdown of skin: Secondary | ICD-10-CM

## 2021-11-29 DIAGNOSIS — E119 Type 2 diabetes mellitus without complications: Secondary | ICD-10-CM | POA: Diagnosis not present

## 2021-11-29 DIAGNOSIS — Z7901 Long term (current) use of anticoagulants: Secondary | ICD-10-CM | POA: Diagnosis not present

## 2021-11-29 DIAGNOSIS — R1030 Lower abdominal pain, unspecified: Secondary | ICD-10-CM | POA: Diagnosis not present

## 2021-11-29 DIAGNOSIS — R339 Retention of urine, unspecified: Secondary | ICD-10-CM | POA: Diagnosis not present

## 2021-11-29 DIAGNOSIS — I2510 Atherosclerotic heart disease of native coronary artery without angina pectoris: Secondary | ICD-10-CM | POA: Insufficient documentation

## 2021-11-29 DIAGNOSIS — J449 Chronic obstructive pulmonary disease, unspecified: Secondary | ICD-10-CM | POA: Diagnosis not present

## 2021-11-29 DIAGNOSIS — T83091A Other mechanical complication of indwelling urethral catheter, initial encounter: Secondary | ICD-10-CM

## 2021-11-29 DIAGNOSIS — I4891 Unspecified atrial fibrillation: Secondary | ICD-10-CM | POA: Insufficient documentation

## 2021-11-29 DIAGNOSIS — T83098A Other mechanical complication of other indwelling urethral catheter, initial encounter: Secondary | ICD-10-CM | POA: Insufficient documentation

## 2021-11-29 DIAGNOSIS — L03818 Cellulitis of other sites: Secondary | ICD-10-CM | POA: Diagnosis not present

## 2021-11-29 LAB — CBC WITH DIFFERENTIAL/PLATELET
Abs Immature Granulocytes: 0.03 10*3/uL (ref 0.00–0.07)
Basophils Absolute: 0.1 10*3/uL (ref 0.0–0.1)
Basophils Relative: 1 %
Eosinophils Absolute: 0.1 10*3/uL (ref 0.0–0.5)
Eosinophils Relative: 2 %
HCT: 34.9 % — ABNORMAL LOW (ref 39.0–52.0)
Hemoglobin: 11.3 g/dL — ABNORMAL LOW (ref 13.0–17.0)
Immature Granulocytes: 1 %
Lymphocytes Relative: 25 %
Lymphs Abs: 1.6 10*3/uL (ref 0.7–4.0)
MCH: 29.1 pg (ref 26.0–34.0)
MCHC: 32.4 g/dL (ref 30.0–36.0)
MCV: 89.9 fL (ref 80.0–100.0)
Monocytes Absolute: 0.5 10*3/uL (ref 0.1–1.0)
Monocytes Relative: 8 %
Neutro Abs: 4 10*3/uL (ref 1.7–7.7)
Neutrophils Relative %: 63 %
Platelets: 332 10*3/uL (ref 150–400)
RBC: 3.88 MIL/uL — ABNORMAL LOW (ref 4.22–5.81)
RDW: 13.6 % (ref 11.5–15.5)
WBC: 6.3 10*3/uL (ref 4.0–10.5)
nRBC: 0 % (ref 0.0–0.2)

## 2021-11-29 LAB — BASIC METABOLIC PANEL
Anion gap: 4 — ABNORMAL LOW (ref 5–15)
BUN: 18 mg/dL (ref 8–23)
CO2: 25 mmol/L (ref 22–32)
Calcium: 10 mg/dL (ref 8.9–10.3)
Chloride: 109 mmol/L (ref 98–111)
Creatinine, Ser: 1.2 mg/dL (ref 0.61–1.24)
GFR, Estimated: >60 mL/min (ref >60)
Glucose, Bld: 151 mg/dL — ABNORMAL HIGH (ref 70–99)
Potassium: 3.9 mmol/L (ref 3.5–5.1)
Sodium: 138 mmol/L (ref 135–145)

## 2021-11-29 LAB — LACTIC ACID, PLASMA: Lactic Acid, Venous: 1.1 mmol/L (ref 0.5–1.9)

## 2021-11-29 MED ORDER — GABAPENTIN 100 MG PO CAPS
100.0000 mg | ORAL_CAPSULE | Freq: Three times a day (TID) | ORAL | 0 refills | Status: DC
Start: 2021-11-29 — End: 2022-02-04

## 2021-11-29 MED ORDER — CLINDAMYCIN HCL 300 MG PO CAPS: 300.0000 mg | ORAL_CAPSULE | Freq: Three times a day (TID) | ORAL | 0 refills | Status: DC

## 2021-11-29 NOTE — Discharge Instructions (Signed)
Take the antibiotic as prescribed and finish the full 7-day course.  Return for new or worsening redness, discharge, drainage, or swelling around the wound on your buttock.  You may take the gabapentin for your neuropathy pain.  Follow-up with your primary care doctor, the urologist, and a neurologist.  Return to the ER for recurrent clogging or malfunction of your Foley catheter, blood in the urine, abdominal pain, fever, or any new or worsening symptoms that concern you.

## 2021-11-29 NOTE — ED Notes (Signed)
Inserted new 25f. Urinary catheter on first attempt with no difficulty. Minimal bleeding with very small clots noted initially but quickly transitioned to urine output x greater than 8051m Pt verbalized immediate relief. Secured catheter with stat-lock device and hung appropriately per protocol.

## 2021-11-29 NOTE — ED Notes (Signed)
ED Provider at bedside. 

## 2021-11-29 NOTE — ED Triage Notes (Signed)
Pt presents to ER with indwelling catheter not draining. Pt reports since 12 pm today he has not been  able to void.

## 2021-11-29 NOTE — ED Provider Notes (Signed)
[facility] Provider Note    Event Date/Time   First MD Initiated Contact with Patient 11/29/21 2028     (approximate)   History   Urinary Retention   HPI  Justin Griffith is a 81 y.o. male with a history of hypertension, CVA, CAD, A-fib on Eliquis, diabetes, COPD, and hyperlipidemia who presents with urinary retention, cute onset around noon today.  The patient has had an indwelling Foley catheter.  After he stopped having urine output he noted suprapubic pain and distention.  He denies any vomiting, fever, or weakness.  Now that new Foley has been placed he states he is feeling much better.    Physical Exam   Triage Vital Signs: ED Triage Vitals  Enc Vitals Group     BP 11/29/21 2033 (!) 188/120     Pulse Rate 11/29/21 2033 (!) 105     Resp 11/29/21 2033 16     Temp 11/29/21 2033 97.9 F (36.6 C)     Temp Source 11/29/21 2033 Oral     SpO2 11/29/21 2033 97 %     Weight 11/29/21 2034 236 lb (107 kg)     Height 11/29/21 2034 '5\' 11"'$  (1.803 m)     Head Circumference --      Peak Flow --      Pain Score 11/29/21 2033 10     Pain Loc --      Pain Edu? --      Excl. in [place]? --     Most recent vital signs: Vitals:   11/29/21 2033 11/29/21 2100  BP: (!) 188/120 (!) 166/97  Pulse: (!) 105 75  Resp: 16   Temp: 97.9 F (36.6 C)   SpO2: 97% 95%    General: Awake, no distress.  CV:  Good peripheral perfusion.  Resp:  Normal effort.  Abd:  Soft and nontender.  No distention.  Other:  Approximately 5 cm diameter superficial sacral pressure ulcer with slightly malodorous, purulent appearing discharge.  Approxi-1 to 2 cm surrounding erythema and induration.  No fluctuance or mass.   ED Results / Procedures / Treatments   Labs (all labs ordered are listed, but only abnormal results are displayed) Labs Reviewed  BASIC METABOLIC PANEL - Abnormal; Notable for the following components:      Result Value   Glucose, Bld 151 (*)    Anion gap 4 (*)     All other components within normal limits  CBC WITH DIFFERENTIAL/PLATELET - Abnormal; Notable for the following components:   RBC 3.88 (*)    Hemoglobin 11.3 (*)    HCT 34.9 (*)    All other components within normal limits  LACTIC ACID, PLASMA     EKG     RADIOLOGY    PROCEDURES:  Critical Care performed: No  Procedures   MEDICATIONS ORDERED IN ED: Medications - No data to display   IMPRESSION / MDM / [place] / ED COURSE  I reviewed the triage vital signs and the nursing notes.  81 year old male with PMH as noted above presents with urinary retention for the last 8 hours.  Shortly after being placed in room the Foley catheter was replaced and his symptoms have resolved.  I reviewed the past medical records.  The patient was recently admitted.  Per the hospitalist discharge summary from 9/4, he presented with altered mental status and septic shock.  He was diagnosed with sepsis due to Enterococcus UTI.  Upon discharge he received a  5-day course of amoxicillin which she states he has finished.  Differential diagnosis includes, but is not limited to, recurrent UTI, other catheter obstruction, AKI.  The patient's sacral wound appears infected although there is no evidence of abscess or deep soft tissue infection.  The wound is superficial.  This is consistent with wound infection/cellulitis.  Patient's presentation is most consistent with acute presentation with potential threat to life or bodily function.  We will obtain basic labs, lactate, and reassess.  The patient's acute symptoms have resolved after the Foley was replaced, and he has already drained almost 1 L of urine.  There is no indication for a repeat urine since he does not have any acute urinary symptoms now that the catheter was replaced, and he just finished the antibiotics within the last day.  Therefore, if the urine shows some WBCs or other abnormalities these would not really be  diagnostic.  The patient is on the cardiac monitor to evaluate for evidence of arrhythmia and/or significant heart rate changes.  ----------------------------------------- 10:15 PM on 11/29/2021 -----------------------------------------  Work-up is overall reassuring.  The patient's WBC count is normal.  His hemoglobin is improved from when he was admitted.  Lactate is normal.  Electrolytes are unremarkable.  Creatinine is also improved from his admission.  There is no evidence of AKI.  The patient feels well and would like to go home.  At this time he is stable for discharge.  I will prescribe clindamycin for the likely mild sacral area cellulitis given the purulent nature of the wound.  I have also prescribed gabapentin as the patient reports neuropathy and was on it previously.  He states that he is no longer on the gabapentin but his neuropathic pain is worse.  He agrees to follow-up with his neurologist.  I counseled the patient and family on the results of the work-up.  I gave strict return precautions and they expressed understanding.   FINAL CLINICAL IMPRESSION(S) / ED DIAGNOSES   Final diagnoses:  Obstruction of Foley catheter, initial encounter ([place])  Skin ulcer of sacrum, limited to breakdown of skin ([place])  Cellulitis of other specified site     Rx / DC Orders   ED Discharge Orders          Ordered    clindamycin (CLEOCIN) 300 MG capsule  3 times daily        11/29/21 2213    gabapentin (NEURONTIN) 100 MG capsule  3 times daily        11/29/21 2213             Note:  This document was prepared using Dragon voice recognition software and may include unintentional dictation errors.    Arta Silence, MD 11/29/21 2218

## 2021-12-01 ENCOUNTER — Ambulatory Visit: Payer: HMO | Admitting: Physician Assistant

## 2021-12-01 ENCOUNTER — Telehealth: Payer: Self-pay | Admitting: *Deleted

## 2021-12-01 NOTE — Telephone Encounter (Signed)
Transition Care Management Unsuccessful Follow-up Telephone Call  Date of discharge and from where:  [facility] 11-29-2021  Attempts:  1st Attempt  Reason for unsuccessful TCM follow-up call:  No answer/busy  PATIENT NEEDS A FOLLOW UP

## 2021-12-02 ENCOUNTER — Telehealth: Payer: Self-pay | Admitting: *Deleted

## 2021-12-02 NOTE — Telephone Encounter (Signed)
        Patient  visited [facility] 11/29/2021 for treatment   Telephone encounter attempt :  1st   A HIPAA compliant voice message was left requesting a return call.  Instructed patient to call back at [phone].  [place] [phone] [address] , [place] [address] Email : [email or web address]

## 2021-12-02 NOTE — Telephone Encounter (Signed)
Transition Care Management Unsuccessful Follow-up Telephone Call  Date of discharge and from where:  [facility] 11-29-2021   Attempts:  2nd Attempt  Reason for unsuccessful TCM follow-up call:  No answer/busy

## 2021-12-05 ENCOUNTER — Emergency Department: Payer: HMO

## 2021-12-05 ENCOUNTER — Inpatient Hospital Stay
Admission: EM | Admit: 2021-12-05 | Discharge: 2021-12-10 | DRG: 092 | Disposition: A | Discharge status: Home Health | Payer: HMO | Attending: Internal Medicine | Admitting: Internal Medicine

## 2021-12-05 ENCOUNTER — Other Ambulatory Visit: Payer: Self-pay

## 2021-12-05 DIAGNOSIS — R338 Other retention of urine: Secondary | ICD-10-CM | POA: Diagnosis present

## 2021-12-05 DIAGNOSIS — S0990XA Unspecified injury of head, initial encounter: Secondary | ICD-10-CM | POA: Diagnosis not present

## 2021-12-05 DIAGNOSIS — Y92009 Unspecified place in unspecified non-institutional (private) residence as the place of occurrence of the external cause: Secondary | ICD-10-CM

## 2021-12-05 DIAGNOSIS — M4802 Spinal stenosis, cervical region: Secondary | ICD-10-CM | POA: Diagnosis not present

## 2021-12-05 DIAGNOSIS — Z79899 Other long term (current) drug therapy: Secondary | ICD-10-CM

## 2021-12-05 DIAGNOSIS — G4733 Obstructive sleep apnea (adult) (pediatric): Secondary | ICD-10-CM | POA: Diagnosis present

## 2021-12-05 DIAGNOSIS — R413 Other amnesia: Secondary | ICD-10-CM | POA: Diagnosis not present

## 2021-12-05 DIAGNOSIS — Z8673 Personal history of transient ischemic attack (TIA), and cerebral infarction without residual deficits: Secondary | ICD-10-CM

## 2021-12-05 DIAGNOSIS — Z87891 Personal history of nicotine dependence: Secondary | ICD-10-CM | POA: Diagnosis not present

## 2021-12-05 DIAGNOSIS — E1142 Type 2 diabetes mellitus with diabetic polyneuropathy: Secondary | ICD-10-CM | POA: Diagnosis present

## 2021-12-05 DIAGNOSIS — S81011A Laceration without foreign body, right knee, initial encounter: Secondary | ICD-10-CM | POA: Diagnosis present

## 2021-12-05 DIAGNOSIS — L89316 Pressure-induced deep tissue damage of right buttock: Secondary | ICD-10-CM | POA: Diagnosis not present

## 2021-12-05 DIAGNOSIS — Z66 Do not resuscitate: Secondary | ICD-10-CM | POA: Diagnosis present

## 2021-12-05 DIAGNOSIS — N1832 Chronic kidney disease, stage 3b: Secondary | ICD-10-CM | POA: Diagnosis not present

## 2021-12-05 DIAGNOSIS — Z6831 Body mass index (BMI) 31.0-31.9, adult: Secondary | ICD-10-CM

## 2021-12-05 DIAGNOSIS — R296 Repeated falls: Secondary | ICD-10-CM | POA: Diagnosis not present

## 2021-12-05 DIAGNOSIS — Z86711 Personal history of pulmonary embolism: Secondary | ICD-10-CM | POA: Diagnosis not present

## 2021-12-05 DIAGNOSIS — G4730 Sleep apnea, unspecified: Secondary | ICD-10-CM | POA: Diagnosis present

## 2021-12-05 DIAGNOSIS — W19XXXA Unspecified fall, initial encounter: Secondary | ICD-10-CM | POA: Diagnosis present

## 2021-12-05 DIAGNOSIS — R269 Unspecified abnormalities of gait and mobility: Secondary | ICD-10-CM | POA: Diagnosis not present

## 2021-12-05 DIAGNOSIS — Z85038 Personal history of other malignant neoplasm of large intestine: Secondary | ICD-10-CM

## 2021-12-05 DIAGNOSIS — L89326 Pressure-induced deep tissue damage of left buttock: Secondary | ICD-10-CM | POA: Diagnosis not present

## 2021-12-05 DIAGNOSIS — S0101XA Laceration without foreign body of scalp, initial encounter: Secondary | ICD-10-CM | POA: Diagnosis not present

## 2021-12-05 DIAGNOSIS — I10 Essential (primary) hypertension: Secondary | ICD-10-CM | POA: Diagnosis not present

## 2021-12-05 DIAGNOSIS — Z7901 Long term (current) use of anticoagulants: Secondary | ICD-10-CM

## 2021-12-05 DIAGNOSIS — Z7951 Long term (current) use of inhaled steroids: Secondary | ICD-10-CM

## 2021-12-05 DIAGNOSIS — S0003XA Contusion of scalp, initial encounter: Secondary | ICD-10-CM | POA: Diagnosis not present

## 2021-12-05 DIAGNOSIS — E785 Hyperlipidemia, unspecified: Secondary | ICD-10-CM | POA: Diagnosis not present

## 2021-12-05 DIAGNOSIS — R262 Difficulty in walking, not elsewhere classified: Secondary | ICD-10-CM | POA: Diagnosis present

## 2021-12-05 DIAGNOSIS — Z955 Presence of coronary angioplasty implant and graft: Secondary | ICD-10-CM

## 2021-12-05 DIAGNOSIS — R6889 Other general symptoms and signs: Secondary | ICD-10-CM

## 2021-12-05 DIAGNOSIS — N401 Enlarged prostate with lower urinary tract symptoms: Secondary | ICD-10-CM | POA: Diagnosis present

## 2021-12-05 DIAGNOSIS — E1122 Type 2 diabetes mellitus with diabetic chronic kidney disease: Secondary | ICD-10-CM | POA: Diagnosis not present

## 2021-12-05 DIAGNOSIS — I130 Hypertensive heart and chronic kidney disease with heart failure and stage 1 through stage 4 chronic kidney disease, or unspecified chronic kidney disease: Secondary | ICD-10-CM | POA: Diagnosis not present

## 2021-12-05 DIAGNOSIS — J439 Emphysema, unspecified: Secondary | ICD-10-CM | POA: Diagnosis present

## 2021-12-05 DIAGNOSIS — J449 Chronic obstructive pulmonary disease, unspecified: Secondary | ICD-10-CM | POA: Diagnosis present

## 2021-12-05 DIAGNOSIS — Z833 Family history of diabetes mellitus: Secondary | ICD-10-CM

## 2021-12-05 DIAGNOSIS — F0390 Unspecified dementia without behavioral disturbance: Secondary | ICD-10-CM | POA: Diagnosis not present

## 2021-12-05 DIAGNOSIS — N138 Other obstructive and reflux uropathy: Secondary | ICD-10-CM | POA: Diagnosis not present

## 2021-12-05 DIAGNOSIS — R531 Weakness: Secondary | ICD-10-CM

## 2021-12-05 DIAGNOSIS — E669 Obesity, unspecified: Secondary | ICD-10-CM | POA: Diagnosis present

## 2021-12-05 DIAGNOSIS — E1129 Type 2 diabetes mellitus with other diabetic kidney complication: Secondary | ICD-10-CM | POA: Diagnosis present

## 2021-12-05 DIAGNOSIS — N390 Urinary tract infection, site not specified: Secondary | ICD-10-CM | POA: Diagnosis not present

## 2021-12-05 DIAGNOSIS — R278 Other lack of coordination: Secondary | ICD-10-CM

## 2021-12-05 DIAGNOSIS — R9431 Abnormal electrocardiogram [ECG] [EKG]: Secondary | ICD-10-CM | POA: Diagnosis not present

## 2021-12-05 DIAGNOSIS — I5032 Chronic diastolic (congestive) heart failure: Secondary | ICD-10-CM | POA: Diagnosis not present

## 2021-12-05 DIAGNOSIS — Z9049 Acquired absence of other specified parts of digestive tract: Secondary | ICD-10-CM

## 2021-12-05 DIAGNOSIS — Z96653 Presence of artificial knee joint, bilateral: Secondary | ICD-10-CM | POA: Diagnosis present

## 2021-12-05 DIAGNOSIS — E1165 Type 2 diabetes mellitus with hyperglycemia: Secondary | ICD-10-CM | POA: Diagnosis present

## 2021-12-05 DIAGNOSIS — Z794 Long term (current) use of insulin: Secondary | ICD-10-CM | POA: Diagnosis present

## 2021-12-05 DIAGNOSIS — Z820 Family history of epilepsy and other diseases of the nervous system: Secondary | ICD-10-CM

## 2021-12-05 DIAGNOSIS — Z888 Allergy status to other drugs, medicaments and biological substances status: Secondary | ICD-10-CM

## 2021-12-05 DIAGNOSIS — S1191XA Laceration without foreign body of unspecified part of neck, initial encounter: Secondary | ICD-10-CM | POA: Diagnosis not present

## 2021-12-05 DIAGNOSIS — E1169 Type 2 diabetes mellitus with other specified complication: Secondary | ICD-10-CM | POA: Diagnosis not present

## 2021-12-05 LAB — TROPONIN I (HIGH SENSITIVITY)
Troponin I (High Sensitivity): 20 ng/L — ABNORMAL HIGH (ref <18)
Troponin I (High Sensitivity): 20 ng/L — ABNORMAL HIGH (ref <18)

## 2021-12-05 LAB — CBC WITH DIFFERENTIAL/PLATELET
Abs Immature Granulocytes: 0.03 10*3/uL (ref 0.00–0.07)
Basophils Absolute: 0.1 10*3/uL (ref 0.0–0.1)
Basophils Relative: 1 %
Eosinophils Absolute: 0.2 10*3/uL (ref 0.0–0.5)
Eosinophils Relative: 3 %
HCT: 36.8 % — ABNORMAL LOW (ref 39.0–52.0)
Hemoglobin: 11.9 g/dL — ABNORMAL LOW (ref 13.0–17.0)
Immature Granulocytes: 0 %
Lymphocytes Relative: 18 %
Lymphs Abs: 1.3 10*3/uL (ref 0.7–4.0)
MCH: 29.2 pg (ref 26.0–34.0)
MCHC: 32.3 g/dL (ref 30.0–36.0)
MCV: 90.4 fL (ref 80.0–100.0)
Monocytes Absolute: 0.6 10*3/uL (ref 0.1–1.0)
Monocytes Relative: 8 %
Neutro Abs: 5.1 10*3/uL (ref 1.7–7.7)
Neutrophils Relative %: 70 %
Platelets: 315 10*3/uL (ref 150–400)
RBC: 4.07 MIL/uL — ABNORMAL LOW (ref 4.22–5.81)
RDW: 14 % (ref 11.5–15.5)
WBC: 7.3 10*3/uL (ref 4.0–10.5)
nRBC: 0 % (ref 0.0–0.2)

## 2021-12-05 LAB — URINALYSIS, ROUTINE W REFLEX MICROSCOPIC
Bilirubin Urine: NEGATIVE
Glucose, UA: 50 mg/dL — AB
Ketones, ur: NEGATIVE mg/dL
Nitrite: NEGATIVE
Protein, ur: 100 mg/dL — AB
Specific Gravity, Urine: 1.01 (ref 1.005–1.030)
Squamous Epithelial / HPF: NONE SEEN (ref 0–5)
pH: 5 (ref 5.0–8.0)

## 2021-12-05 LAB — COMPREHENSIVE METABOLIC PANEL
ALT: 24 U/L (ref 0–44)
AST: 23 U/L (ref 15–41)
Albumin: 3.2 g/dL — ABNORMAL LOW (ref 3.5–5.0)
Alkaline Phosphatase: 57 U/L (ref 38–126)
Anion gap: 7 (ref 5–15)
BUN: 13 mg/dL (ref 8–23)
CO2: 26 mmol/L (ref 22–32)
Calcium: 10.6 mg/dL — ABNORMAL HIGH (ref 8.9–10.3)
Chloride: 108 mmol/L (ref 98–111)
Creatinine, Ser: 1.14 mg/dL (ref 0.61–1.24)
GFR, Estimated: >60 mL/min (ref >60)
Glucose, Bld: 157 mg/dL — ABNORMAL HIGH (ref 70–99)
Potassium: 3.4 mmol/L — ABNORMAL LOW (ref 3.5–5.1)
Sodium: 141 mmol/L (ref 135–145)
Total Bilirubin: 0.5 mg/dL (ref 0.3–1.2)
Total Protein: 7.2 g/dL (ref 6.5–8.1)

## 2021-12-05 LAB — GLUCOSE, CAPILLARY
Glucose-Capillary: 150 mg/dL — ABNORMAL HIGH (ref 70–99)
Glucose-Capillary: 126 mg/dL — ABNORMAL HIGH (ref 70–99)

## 2021-12-05 LAB — CBG MONITORING, ED: Glucose-Capillary: 134 mg/dL — ABNORMAL HIGH (ref 70–99)

## 2021-12-05 LAB — BRAIN NATRIURETIC PEPTIDE: B Natriuretic Peptide: 108.4 pg/mL — ABNORMAL HIGH (ref 0.0–100.0)

## 2021-12-05 MED ORDER — ACETAMINOPHEN 325 MG PO TABS
650.0000 mg | ORAL_TABLET | Freq: Four times a day (QID) | ORAL | Status: DC | PRN
  Administered 2021-12-05: 650 mg via ORAL
  Filled 2021-12-05: qty 2

## 2021-12-05 MED ORDER — HYDRALAZINE HCL 20 MG/ML IJ SOLN
5.0000 mg | Freq: Four times a day (QID) | INTRAMUSCULAR | Status: DC | PRN
  Administered 2021-12-05: 5 mg via INTRAVENOUS
  Filled 2021-12-05 (×2): qty 1

## 2021-12-05 MED ORDER — SODIUM CHLORIDE 0.9 % IV SOLN: 250.0000 mL | INTRAVENOUS | Status: DC | PRN

## 2021-12-05 MED ORDER — BUDESON-GLYCOPYRROL-FORMOTEROL 160-9-4.8 MCG/ACT IN AERO: 1.0000 | INHALATION_SPRAY | Freq: Every day | RESPIRATORY_TRACT | Status: DC

## 2021-12-05 MED ORDER — MAGNESIUM OXIDE -MG SUPPLEMENT 400 (240 MG) MG PO TABS
400.0000 mg | ORAL_TABLET | Freq: Every day | ORAL | Status: DC
  Administered 2021-12-05 – 2021-12-10 (×6): 400 mg via ORAL
  Filled 2021-12-05 (×6): qty 1

## 2021-12-05 MED ORDER — ARFORMOTEROL TARTRATE 15 MCG/2ML IN NEBU
15.0000 ug | INHALATION_SOLUTION | Freq: Two times a day (BID) | RESPIRATORY_TRACT | Status: DC
  Administered 2021-12-06 – 2021-12-10 (×8): 15 ug via RESPIRATORY_TRACT
  Filled 2021-12-05 (×13): qty 2

## 2021-12-05 MED ORDER — ROSUVASTATIN CALCIUM 10 MG PO TABS
10.0000 mg | ORAL_TABLET | Freq: Every day | ORAL | Status: DC
  Administered 2021-12-05 – 2021-12-09 (×5): 10 mg via ORAL
  Filled 2021-12-05 (×5): qty 1

## 2021-12-05 MED ORDER — GLUCERNA SHAKE PO LIQD
237.0000 mL | Freq: Three times a day (TID) | ORAL | Status: DC
  Administered 2021-12-05 – 2021-12-10 (×8): 237 mL via ORAL

## 2021-12-05 MED ORDER — UMECLIDINIUM BROMIDE 62.5 MCG/ACT IN AEPB
1.0000 | INHALATION_SPRAY | Freq: Every day | RESPIRATORY_TRACT | Status: DC
  Administered 2021-12-07 – 2021-12-10 (×4): 1 via RESPIRATORY_TRACT
  Filled 2021-12-05 (×2): qty 7

## 2021-12-05 MED ORDER — INSULIN GLARGINE-YFGN 100 UNIT/ML ~~LOC~~ SOLN
10.0000 [IU] | Freq: Every day | SUBCUTANEOUS | Status: DC
  Administered 2021-12-05 – 2021-12-09 (×5): 10 [IU] via SUBCUTANEOUS
  Filled 2021-12-05 (×6): qty 0.1

## 2021-12-05 MED ORDER — TRIMETHOPRIM 100 MG PO TABS
100.0000 mg | ORAL_TABLET | Freq: Every day | ORAL | Status: DC
  Administered 2021-12-06: 100 mg via ORAL
  Filled 2021-12-05 (×2): qty 1

## 2021-12-05 MED ORDER — BUDESONIDE 0.5 MG/2ML IN SUSP
0.5000 mg | Freq: Two times a day (BID) | RESPIRATORY_TRACT | Status: DC
  Administered 2021-12-05 – 2021-12-10 (×10): 0.5 mg via RESPIRATORY_TRACT
  Filled 2021-12-05 (×10): qty 2

## 2021-12-05 MED ORDER — LABETALOL HCL 5 MG/ML IV SOLN
10.0000 mg | Freq: Once | INTRAVENOUS | Status: DC
  Filled 2021-12-05: qty 4

## 2021-12-05 MED ORDER — INSULIN ASPART 100 UNIT/ML IJ SOLN
0.0000 [IU] | Freq: Three times a day (TID) | INTRAMUSCULAR | Status: DC
  Administered 2021-12-05 – 2021-12-07 (×5): 3 [IU] via SUBCUTANEOUS
  Administered 2021-12-07: 4 [IU] via SUBCUTANEOUS
  Administered 2021-12-08: 3 [IU] via SUBCUTANEOUS
  Administered 2021-12-08: 4 [IU] via SUBCUTANEOUS
  Administered 2021-12-09 (×3): 3 [IU] via SUBCUTANEOUS
  Administered 2021-12-10: 2 [IU] via SUBCUTANEOUS
  Administered 2021-12-10: 4 [IU] via SUBCUTANEOUS
  Filled 2021-12-05 (×13): qty 1

## 2021-12-05 MED ORDER — HYDRALAZINE HCL 50 MG PO TABS
50.0000 mg | ORAL_TABLET | Freq: Three times a day (TID) | ORAL | Status: DC
  Administered 2021-12-05 – 2021-12-10 (×15): 50 mg via ORAL
  Filled 2021-12-05 (×15): qty 1

## 2021-12-05 MED ORDER — PANTOPRAZOLE SODIUM 40 MG PO TBEC
40.0000 mg | DELAYED_RELEASE_TABLET | Freq: Every day | ORAL | Status: DC
  Administered 2021-12-05 – 2021-12-10 (×6): 40 mg via ORAL
  Filled 2021-12-05 (×6): qty 1

## 2021-12-05 MED ORDER — CARVEDILOL 6.25 MG PO TABS
3.1250 mg | ORAL_TABLET | Freq: Two times a day (BID) | ORAL | Status: DC
  Administered 2021-12-05 – 2021-12-10 (×10): 3.125 mg via ORAL
  Filled 2021-12-05 (×10): qty 1

## 2021-12-05 MED ORDER — CLINDAMYCIN HCL 150 MG PO CAPS: 300.0000 mg | ORAL_CAPSULE | Freq: Three times a day (TID) | ORAL | Status: DC

## 2021-12-05 MED ORDER — SODIUM CHLORIDE 0.9% FLUSH
3.0000 mL | Freq: Two times a day (BID) | INTRAVENOUS | Status: DC
  Administered 2021-12-05 – 2021-12-10 (×11): 3 mL via INTRAVENOUS

## 2021-12-05 MED ORDER — TIOTROPIUM BROMIDE MONOHYDRATE 2.5 MCG/ACT IN AERS: 2.0000 | INHALATION_SPRAY | Freq: Every day | RESPIRATORY_TRACT | Status: DC

## 2021-12-05 MED ORDER — INSULIN GLARGINE-YFGN 100 UNIT/ML ~~LOC~~ SOPN: 5.0000 [IU] | PEN_INJECTOR | Freq: Every day | SUBCUTANEOUS | Status: DC

## 2021-12-05 MED ORDER — SODIUM CHLORIDE 0.9% FLUSH: 3.0000 mL | INTRAVENOUS | Status: DC | PRN

## 2021-12-05 MED ORDER — APIXABAN 2.5 MG PO TABS
2.5000 mg | ORAL_TABLET | Freq: Two times a day (BID) | ORAL | Status: DC
  Filled 2021-12-05: qty 1

## 2021-12-05 MED ORDER — APIXABAN 5 MG PO TABS
5.0000 mg | ORAL_TABLET | Freq: Two times a day (BID) | ORAL | Status: DC
  Administered 2021-12-05 – 2021-12-10 (×10): 5 mg via ORAL
  Filled 2021-12-05 (×10): qty 1

## 2021-12-05 MED ORDER — VITAMIN B-12 1000 MCG PO TABS
1000.0000 ug | ORAL_TABLET | Freq: Every day | ORAL | Status: DC
  Administered 2021-12-06 – 2021-12-09 (×5): 1000 ug via ORAL
  Filled 2021-12-05 (×5): qty 1

## 2021-12-05 MED ORDER — ACETAMINOPHEN 500 MG PO TABS
1000.0000 mg | ORAL_TABLET | Freq: Once | ORAL | Status: AC
  Administered 2021-12-05: 1000 mg via ORAL
  Filled 2021-12-05: qty 2

## 2021-12-05 MED ORDER — INSULIN GLARGINE-YFGN 100 UNIT/ML ~~LOC~~ SOPN
10.0000 [IU] | PEN_INJECTOR | Freq: Every day | SUBCUTANEOUS | Status: DC
  Filled 2021-12-05: qty 3

## 2021-12-05 MED ORDER — TRAMADOL HCL 50 MG PO TABS
100.0000 mg | ORAL_TABLET | Freq: Four times a day (QID) | ORAL | Status: DC | PRN
  Administered 2021-12-05 – 2021-12-06 (×2): 100 mg via ORAL
  Filled 2021-12-05 (×3): qty 2

## 2021-12-05 MED ORDER — CLONIDINE HCL 0.1 MG PO TABS
0.1000 mg | ORAL_TABLET | Freq: Two times a day (BID) | ORAL | Status: DC
  Administered 2021-12-05 – 2021-12-10 (×11): 0.1 mg via ORAL
  Filled 2021-12-05 (×11): qty 1

## 2021-12-05 MED ORDER — PHENAZOPYRIDINE HCL 100 MG PO TABS
100.0000 mg | ORAL_TABLET | Freq: Once | ORAL | Status: AC
  Administered 2021-12-06: 100 mg via ORAL
  Filled 2021-12-05: qty 1

## 2021-12-05 NOTE — Assessment & Plan Note (Signed)
Patient completing course of clindamycin. At high risk for chronic colonization and recurrent infection.  Plan D/c clindamycin  Trimethoprim 100 mg daily prophylaxis

## 2021-12-05 NOTE — Assessment & Plan Note (Signed)
Continue home meds. Has had recent lipid panel.

## 2021-12-05 NOTE — Assessment & Plan Note (Signed)
Patient reports foley last exchanged Monday, 9/11.

## 2021-12-05 NOTE — NC FL2 (Signed)
[place] LEVEL OF CARE SCREENING TOOL     IDENTIFICATION  Patient Name: Justin Griffith Birthdate: 05-20-1940 Sex: male Admission Date (Current Location): 12/05/2021  [facility] and [place] Number:  Engineering geologist and Address:  [facility], [address], [place], [place] [address]      Provider Number: [id number]  Attending Physician Name and Address:  Neena Rhymes, MD  Relative Name and Phone Number:  Zenon Mayo (Daughter)   [phone] (Mobile)    Current Level of Care: Hospital Recommended Level of Care: [place] Prior Approval Number:    Date Approved/Denied:   PASRR Number: [id number]  Discharge Plan:      Current Diagnoses: Patient Active Problem List   Diagnosis Date Noted   Unable to ambulate 12/05/2021   Lower abdominal pain 11/23/2021   Pressure injury of sacral region, unstageable ([place])    Septic shock ([place]) 11/18/2021   Lobar pneumonia ([place]) 11/18/2021   Acute kidney injury superimposed on chronic kidney disease ([place]) 11/18/2021   Acute lower UTI 11/18/2021   Type 2 diabetes mellitus with chronic kidney disease, without long-term current use of insulin ([place]) 11/18/2021   Hyperkalemia    Recurrent falls 10/31/2021   Stroke ([place]) 10/31/2021   HLD (hyperlipidemia) 10/31/2021   Type II diabetes mellitus with renal manifestations ([place]) 10/31/2021   CAD (coronary artery disease) 10/31/2021   Chronic diastolic CHF (congestive heart failure) ([place]) 10/31/2021   Right elbow pain 10/15/2021   Gross hematuria 10/12/2021   Chronic anticoagulation 10/12/2021   Stage 3b chronic kidney disease (CKD) ([place]) 10/12/2021   History of pulmonary embolism 09/18/21 10/06/2021   Essential hypertension 09/19/2021   Dyslipidemia 09/19/2021   Generalized weakness 06/25/2021   Lymphedema 03/17/2021   Headache disorder 02/27/2021   Falls frequently 11/28/2020   Aortic atherosclerosis ([place]) 06/12/2020    History of embolic stroke 37/34/2876   At high risk for falls 05/08/2020   History of cold sores 05/06/2020   Hyponatremia 03/29/2020   History of non-ST elevation myocardial infarction (NSTEMI) 03/28/2020   Elevated TSH 02/25/2020   Poor mobility 02/02/2020   Coronary artery disease 01/23/2020   History of CVA (cerebrovascular accident) 01/23/2020   Memory changes 01/01/2020   Chronic venous insufficiency 05/23/2019   B12 deficiency 04/23/2019   Constipation 04/21/2019   Tremor 11/14/2018   Obesity (BMI 30-39.9) 09/07/2017   Chronic obstructive pulmonary disease (COPD) ([place]) 04/29/2017   Intertrigo 02/02/2017   BPH with obstruction/lower urinary tract symptoms Foley dependent 07/23/2016   Advanced care planning/counseling discussion 81/15/7262   Eosinophilic esophagitis 03/55/9741   PAD (peripheral artery disease) ([place]) 01/08/2016   GERD without esophagitis 01/06/2016   Hyperlipidemia associated with type 2 diabetes mellitus ([place]) 01/16/2015   Sleep apnea 01/16/2015   BMI 40.0-44.9, adult ([place]) 01/16/2015   Hypertension associated with diabetes ([place]) 10/11/2014   Type 2 diabetes mellitus with proteinuria ([place]) 10/11/2014   Back pain 10/11/2014   History of colon cancer     Orientation RESPIRATION BLADDER Height & Weight     Self  Normal Incontinent Weight: 233 lb 11 oz (106 kg) Height:     BEHAVIORAL SYMPTOMS/MOOD NEUROLOGICAL BOWEL NUTRITION STATUS      Continent Diet (heart healthy/carb modified)  AMBULATORY STATUS COMMUNICATION OF NEEDS Skin   Extensive Assist Verbally Other (Comment) (pressure injury buttocks)                       Personal Care Assistance Level of  Assistance  Bathing, Feeding, Dressing Bathing Assistance: Maximum assistance Feeding assistance: Independent Dressing Assistance: Maximum assistance     Functional Limitations Info  Hearing, Speech   Hearing Info: Impaired Speech Info: Impaired    SPECIAL CARE FACTORS FREQUENCY  PT (By  licensed PT), OT (By licensed OT)     PT Frequency: 5 times per week OT Frequency: 5 times per week            Contractures      Additional Factors Info  Code Status, Allergies Code Status Info: full Allergies Info: Farxiga (Dapagliflozin), Dulaglutide, Liraglutide, Jardiance (Empagliflozin)           Current Medications (12/05/2021):  This is the current hospital active medication list Current Facility-Administered Medications  Medication Dose Route Frequency Provider Last Rate Last Admin   0.9 %  sodium chloride infusion  250 mL Intravenous PRN Norins, Heinz Knuckles, MD       acetaminophen (TYLENOL) tablet 650 mg  650 mg Oral Q6H PRN Norins, Heinz Knuckles, MD       apixaban [facility]) tablet 5 mg  5 mg Oral BID Norins, Heinz Knuckles, MD       arformoterol [facility]) nebulizer solution 15 mcg  15 mcg Nebulization BID Hallaji, Sheema M, RPH       And   umeclidinium bromide (INCRUSE ELLIPTA) 62.5 MCG/ACT 1 puff  1 puff Inhalation Daily Hallaji, Sheema M, RPH       And   budesonide (PULMICORT) nebulizer solution 0.5 mg  0.5 mg Nebulization BID Hallaji, Sheema M, RPH       carvedilol (COREG) tablet 3.125 mg  3.125 mg Oral BID WC Norins, Heinz Knuckles, MD   3.125 mg at 12/05/21 1429   cloNIDine (CATAPRES) tablet 0.1 mg  0.1 mg Oral BID Neena Rhymes, MD   0.1 mg at 12/05/21 1429   cyanocobalamin (VITAMIN B12) tablet 1,000 mcg  1,000 mcg Oral QHS Norins, Heinz Knuckles, MD       feeding supplement (GLUCERNA SHAKE) (GLUCERNA SHAKE) liquid 237 mL  237 mL Oral TID BM Norins, Heinz Knuckles, MD       hydrALAZINE (APRESOLINE) injection 5 mg  5 mg Intravenous Q6H PRN Norins, Heinz Knuckles, MD   5 mg at 12/05/21 1238   hydrALAZINE (APRESOLINE) tablet 50 mg  50 mg Oral TID Neena Rhymes, MD   50 mg at 12/05/21 1429   insulin aspart (novoLOG) injection 0-20 Units  0-20 Units Subcutaneous TID WC Norins, Heinz Knuckles, MD       insulin glargine-yfgn [facility]) injection 10 Units  10 Units Subcutaneous QHS Coulter, Carolyn,  RPH       magnesium oxide (MAG-OX) tablet 400 mg  400 mg Oral Daily Norins, Heinz Knuckles, MD   400 mg at 12/05/21 1429   pantoprazole (PROTONIX) EC tablet 40 mg  40 mg Oral Daily Norins, Heinz Knuckles, MD   40 mg at 12/05/21 1428   rosuvastatin (CRESTOR) tablet 10 mg  10 mg Oral QHS Norins, Heinz Knuckles, MD       sodium chloride flush (NS) 0.9 % injection 3 mL  3 mL Intravenous Q12H Norins, Heinz Knuckles, MD   3 mL at 12/05/21 1432   sodium chloride flush (NS) 0.9 % injection 3 mL  3 mL Intravenous PRN Norins, Heinz Knuckles, MD       trimethoprim (TRIMPEX) tablet 100 mg  100 mg Oral Daily Norins, Heinz Knuckles, MD         Discharge Medications: Please  see discharge summary for a list of discharge medications.  Relevant Imaging Results:  Relevant Lab Results:   Additional Information SS #: [id number]  [place], LCSW

## 2021-12-05 NOTE — Assessment & Plan Note (Signed)
Per family pateint with some memory issues. By his report he has been able to manage ADLs and provide care for his wife who has Alzheimers. Daughter reports she has been primary care giver for both her parents.   Plan TOC consult re: HH needs vs SNF/long term care

## 2021-12-05 NOTE — ED Provider Notes (Signed)
[facility] Provider Note    Event Date/Time   First MD Initiated Contact with Patient 12/05/21 [id number]     (approximate)   History   Fall, Head Injury, and Laceration   HPI  REASON HELZER is a 81 y.o. male with history of dementia, hypertension, hyperlipidemia, COPD, diabetes, PE on Eliquis who presents to the emergency department EMS after he had an unwitnessed fall at home.  Patient complaining of headache.  EMS reports that he is well-known to them with frequent falls.  They are already called out once earlier tonight for a fall without injury.  Patient lives at home with his wife who also has dementia.  History provided by patient and EMS.  Level 5 caveat secondary to dementia.  Called daughter [name] with no answer.  Left HIPAA compliant voicemail.  Spoke to [place], patient's wife, by phone.  She is not able to answer any questions that she also has dementia.  She is not sure who called 911.  She states no one else is at home with her.   Past Medical History:  Diagnosis Date   Acute urinary retention    Arthritis    Asthma 2000   Back pain    Colon polyp 2012   COPD (chronic obstructive pulmonary disease) (HCC)    Emphysema of lung (HCC)    Heart murmur    Hernia 2000   Hyperlipidemia    Hypertension    Personal history of malignant neoplasm of large intestine    Personal history of tobacco use, presenting hazards to health    Sleep apnea    Special screening for malignant neoplasms, colon    Type 2 diabetes mellitus with proteinuria ([place]) 10/11/2014    Past Surgical History:  Procedure Laterality Date   CARDIAC CATHETERIZATION Left 09/25/2015   Procedure: Left Heart Cath and Coronary Angiography;  Surgeon: Yolonda Kida, MD;  Location: [place] [facility];  Service: Cardiovascular;  Laterality: Left;   CHOLECYSTECTOMY  1970   COLON SURGERY  07-16-99   sigmoid colon resection with primary anastomosis, chemotherapy for metastatic  disease   COLONOSCOPY  2001, 2012   Dr Bary Castilla, tubular adenoma of the cecum and ascending colon in 2012.   COLONOSCOPY WITH PROPOFOL N/A 02/12/2016   Procedure: COLONOSCOPY WITH PROPOFOL;  Surgeon: Robert Bellow, MD;  Location: [facility] ENDOSCOPY;  Service: Endoscopy;  Laterality: N/A;   CORONARY STENT INTERVENTION N/A 05/17/2020   Procedure: CORONARY STENT INTERVENTION;  Surgeon: Yolonda Kida, MD;  Location: [place] [facility];  Service: Cardiovascular;  Laterality: N/A;  LAD   ESOPHAGOGASTRODUODENOSCOPY (EGD) WITH PROPOFOL N/A 02/12/2016   Procedure: ESOPHAGOGASTRODUODENOSCOPY (EGD) WITH PROPOFOL;  Surgeon: Robert Bellow, MD;  Location: [facility] ENDOSCOPY;  Service: Endoscopy;  Laterality: N/A;   HERNIA REPAIR Right    right inguinal hernia repair   JOINT REPLACEMENT Bilateral    knee replacement   KNEE SURGERY Bilateral 2010   LEFT HEART CATH AND CORONARY ANGIOGRAPHY N/A 05/17/2020   Procedure: LEFT HEART CATH AND CORONARY ANGIOGRAPHY possible PCI and stent;  Surgeon: Yolonda Kida, MD;  Location: [place] [facility];  Service: Cardiovascular;  Laterality: N/A;   MYRINGOTOMY WITH TUBE PLACEMENT Bilateral 08/16/2014   Procedure: MYRINGOTOMY WITH TUBE PLACEMENT;  Surgeon: Carloyn Manner, MD;  Location: [facility] ORS;  Service: ENT;  Laterality: Bilateral;   PERIPHERAL VASCULAR CATHETERIZATION Right 09/04/2015   Procedure: Lower Extremity Angiography;  Surgeon: Katha Cabal, MD;  Location: [facility] INVASIVE [facility]  [facility];  Service: Cardiovascular;  Laterality: Right;   PERIPHERAL VASCULAR CATHETERIZATION  09/04/2015   Procedure: Lower Extremity Intervention;  Surgeon: Katha Cabal, MD;  Location: [place] [facility];  Service: Cardiovascular;;   TONSILLECTOMY     TYMPANOSTOMY TUBE PLACEMENT      MEDICATIONS:  Prior to Admission medications   Medication Sig Start Date End Date Taking? Authorizing Provider  acetaminophen (TYLENOL) 325 MG tablet Take 650 mg by mouth every 6 (six)  hours as needed.    [provider]  aluminum-magnesium hydroxide-simethicone (MAALOX) 200-200-20 MG/5ML SUSP Take 30 mLs by mouth 4 (four) times daily -  before meals and at bedtime.    [provider]  apixaban (ELIQUIS) 2.5 MG TABS tablet Take 1 tablet (2.5 mg total) by mouth 2 (two) times daily. 11/24/21   Loletha Grayer, MD  ascorbic acid (VITAMIN C) 500 MG tablet Take 1 tablet (500 mg total) by mouth daily. 11/24/21   Wieting, Richard, MD  BREZTRI AEROSPHERE 160-9-4.8 MCG/ACT AERO Inhale 1 puff into the lungs daily. 08/19/21   [provider]  carvedilol (COREG) 3.125 MG tablet Take 1 tablet (3.125 mg total) by mouth 2 (two) times daily with a meal. 11/04/21   Fritzi Mandes, MD  clindamycin (CLEOCIN) 300 MG capsule Take 1 capsule (300 mg total) by mouth 3 (three) times daily for 7 days. 11/29/21 12/06/21  Arta Silence, MD  cloNIDine (CATAPRES) 0.1 MG tablet Take 0.1 mg by mouth 2 (two) times daily. 12/16/20   [provider]  cyanocobalamin 1000 MCG tablet Take 1 tablet (1,000 mcg total) by mouth at bedtime. 11/04/21   Fritzi Mandes, MD  feeding supplement, GLUCERNA SHAKE, (GLUCERNA SHAKE) LIQD Take 237 mLs by mouth 3 (three) times daily between meals. 11/24/21   Loletha Grayer, MD  gabapentin (NEURONTIN) 100 MG capsule Take 1 capsule (100 mg total) by mouth 3 (three) times daily. 11/29/21 12/29/21  Arta Silence, MD  hydrALAZINE (APRESOLINE) 50 MG tablet TAKE ONE TABLET BY MOUTH THREE TIMES A DAY FOR BLOOD PRESSURE 12/16/20   [provider]  insulin aspart (NOVOLOG) 100 UNIT/ML injection For glucose 121-150 give 1 unit subcutaneous insulin; 151- 200: 2 units; 201 to 250: 3 units; 251-300: 5 units; 301 to 350: 7 units; 351 and above 9 units. 11/24/21   Loletha Grayer, MD  insulin glargine-yfgn (SEMGLEE) 100 UNIT/ML Pen Inject 5 Units into the skin at bedtime. 11/24/21   Loletha Grayer, MD  magnesium oxide (MAG-OX) 400 (240 Mg) MG tablet Take 1 tablet (400  mg total) by mouth daily. 06/27/21   Lorella Nimrod, MD  Multiple Vitamin (MULTIVITAMIN WITH MINERALS) TABS tablet Take 1 tablet by mouth daily. 11/24/21   Loletha Grayer, MD  pantoprazole (PROTONIX) 40 MG tablet Take 40 mg by mouth daily.    [provider]  rosuvastatin (CRESTOR) 10 MG tablet Take 1 tablet (10 mg total) by mouth at bedtime. 11/24/21   Loletha Grayer, MD  Tiotropium Bromide Monohydrate (SPIRIVA RESPIMAT) 2.5 MCG/ACT AERS Inhale 2 puffs into the lungs daily. 05/15/20   Vigg, Avanti, MD  Vitamin D, Cholecalciferol, 25 MCG (1000 UT) CAPS Take 2,000 mcg by mouth at bedtime.    [provider]    Physical Exam   Triage Vital Signs: ED Triage Vitals  Enc Vitals Group     BP 12/05/21 0513 (!) 206/113     Pulse Rate 12/05/21 0513 94     Resp 12/05/21 0513 (!) 21     Temp 12/05/21 0513  98.1 F (36.7 C)     Temp Source 12/05/21 0513 Oral     SpO2 12/05/21 0513 97 %     Weight 12/05/21 0511 233 lb 11 oz (106 kg)     Height --      Head Circumference --      Peak Flow --      Pain Score 12/05/21 0511 8     Pain Loc --      Pain Edu? --      Excl. in [place]? --     Most recent vital signs: Vitals:   12/05/21 0550 12/05/21 0610  BP: (!) 177/113 (!) 174/99  Pulse: 94 92  Resp: 18 19  Temp:    SpO2: 98% 98%     CONSTITUTIONAL: Alert and oriented to person but not place, time or situation.  Obese, elderly.  Extremely hard of hearing. HEAD: Normocephalic; 2.5 cm laceration to the posterior scalp EYES: Conjunctivae clear, PERRL, EOMI ENT: normal nose; no rhinorrhea; moist mucous membranes; pharynx without lesions noted; no dental injury; no septal hematoma, no epistaxis; no facial deformity or bony tenderness NECK: Supple, no midline spinal tenderness, step-off or deformity; trachea midline CARD: RRR; S1 and S2 appreciated; no murmurs, no clicks, no rubs, no gallops RESP: Normal chest excursion without splinting or tachypnea; breath sounds clear and equal  bilaterally; no wheezes, no rhonchi, no rales; no hypoxia or respiratory distress CHEST:  chest wall stable, no crepitus or ecchymosis or deformity, nontender to palpation; no flail chest ABD/GI: Normal bowel sounds; non-distended; soft, non-tender, no rebound, no guarding; no ecchymosis or other lesions noted, indwelling Foley catheter in place PELVIS:  stable, nontender to palpation BACK:  The back appears normal; no midline spinal tenderness, step-off or deformity EXT: Normal ROM in all joints; non-tender to palpation; no edema; normal capillary refill; no cyanosis, no bony tenderness or bony deformity of patient's extremities, no joint effusion, compartments are soft, extremities are warm and well-perfused, no ecchymosis SKIN: Normal color for age and race; warm NEURO: No facial asymmetry, normal speech, moving all extremities equally  ED Results / Procedures / Treatments   LABS: (all labs ordered are listed, but only abnormal results are displayed) Labs Reviewed  CBC WITH DIFFERENTIAL/PLATELET - Abnormal; Notable for the following components:      Result Value   RBC 4.07 (*)    Hemoglobin 11.9 (*)    HCT 36.8 (*)    All other components within normal limits  COMPREHENSIVE METABOLIC PANEL - Abnormal; Notable for the following components:   Potassium 3.4 (*)    Glucose, Bld 157 (*)    Calcium 10.6 (*)    Albumin 3.2 (*)    All other components within normal limits  URINALYSIS, ROUTINE W REFLEX MICROSCOPIC - Abnormal; Notable for the following components:   Color, Urine YELLOW (*)    APPearance HAZY (*)    Glucose, UA 50 (*)    Hgb urine dipstick SMALL (*)    Protein, ur 100 (*)    Leukocytes,Ua SMALL (*)    Bacteria, UA RARE (*)    All other components within normal limits  TROPONIN I (HIGH SENSITIVITY) - Abnormal; Notable for the following components:   Troponin I (High Sensitivity) 20 (*)    All other components within normal limits  URINE CULTURE     EKG:  EKG  Interpretation  Date/Time:  Friday December 05 2021 05:12:49 EDT Ventricular Rate:  93 PR Interval:  177 QRS Duration: 111 QT Interval:  368 QTC Calculation: 458 R Axis:   -43 Text Interpretation: Sinus rhythm LAD, consider left anterior fascicular block Low voltage, precordial leads Consider anterior infarct Minimal ST depression, lateral leads Confirmed by Pryor Curia [phone]) on 12/05/2021 5:26:28 AM          RADIOLOGY: My personal review and interpretation of imaging: CT head and cervical spine show soft tissue injury but no other other acute abnormality.  I have personally reviewed all radiology reports. CT Cervical Spine Wo Contrast  Result Date: 12/05/2021 CLINICAL DATA:  81 year old male status post unwitnessed fall at home. Posterior laceration. EXAM: CT CERVICAL SPINE WITHOUT CONTRAST TECHNIQUE: Multidetector CT imaging of the cervical spine was performed without intravenous contrast. Multiplanar CT image reconstructions were also generated. RADIATION DOSE REDUCTION: This exam was performed according to the departmental dose-optimization program which includes automated exposure control, adjustment of the mA and/or kV according to patient size and/or use of iterative reconstruction technique. COMPARISON:  CT head today.  Cervical spine CT 10/30/2021. FINDINGS: Alignment: Chronic straightening of cervical lordosis. Cervicothoracic junction alignment is within normal limits. Bilateral posterior element alignment is within normal limits. Skull base and vertebrae: Visualized skull base is intact. No atlanto-occipital dissociation. C1 and C2 appear stable and intact. Chronic anterior odontoid subchondral cyst. No acute osseous abnormality identified. Soft tissues and spinal canal: No prevertebral fluid or swelling. No visible canal hematoma. Stable noncontrast neck soft tissues including bulky calcified carotid atherosclerosis. Disc levels: Chronic severe cervical spine degeneration.  Bulky facet degeneration, disc and endplate degeneration. Multilevel cervical spinal stenosis appears stable and maximal at C4-C5 and C5-C6. Upper chest: Visible upper thoracic levels appear intact. Negative lung apices and noncontrast thoracic inlet. Other: Chronically absent dentition. Visible facial bones appear intact. IMPRESSION: 1. No acute traumatic injury identified in the cervical spine. 2. Chronic severe cervical spine degeneration. Stable CT appearance of multilevel cervical spinal stenosis, maximal at C4-C5 and C5-C6. Electronically Signed   By: Genevie Ann M.D.   On: 12/05/2021 06:18   CT HEAD WO CONTRAST (5MM)  Result Date: 12/05/2021 CLINICAL DATA:  81 year old male status post unwitnessed fall at home. Posterior laceration. EXAM: CT HEAD WITHOUT CONTRAST TECHNIQUE: Contiguous axial images were obtained from the base of the skull through the vertex without intravenous contrast. RADIATION DOSE REDUCTION: This exam was performed according to the departmental dose-optimization program which includes automated exposure control, adjustment of the mA and/or kV according to patient size and/or use of iterative reconstruction technique. COMPARISON:  Head CT 11/17/2021. FINDINGS: Brain: Stable cerebral volume. Chronic infarct in the left cerebellum PICA territory, confluent bilateral cerebral white matter hypodensity appears stable since last month. Deep gray matter nuclei appear stable with asymmetric heterogeneous hypodensity in the left thalamus, and the right caudothalamic groove. No midline shift, ventriculomegaly, mass effect, evidence of mass lesion, intracranial hemorrhage or evidence of cortically based acute infarction. Vascular: Calcified atherosclerosis at the skull base. Skull: No skull fracture identified. Sinuses/Orbits: Chronic mastoid sclerosis is stable. Tympanic cavities remain clear. Visualized paranasal sinuses are stable and well aerated. Other: Broad-based posterior convexity scalp  hematoma on the right. Skin staples and small volume soft tissue gas there. Underlying calvarium appears stable and intact. Other orbit and scalp soft tissues appears stable. IMPRESSION: 1. Large right posterior convexity scalp hematoma/laceration. No underlying skull fracture. 2. No acute intracranial abnormality. 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 Electronically Signed   By: Genevie Ann M.D.   On: 12/05/2021 06:14  PROCEDURES:  Critical Care performed: No      Procedures   LACERATION REPAIR Performed by: Pryor Curia Authorized by: Pryor Curia Consent: Verbal consent obtained. Risks and benefits: risks, benefits and alternatives were discussed Consent given by: patient Patient identity confirmed: provided demographic data Prepped and Draped in normal sterile fashion Wound explored  Laceration Location: Posterior right scalp  Laceration Length: 2.5 cm  No Foreign Bodies seen or palpated  Anesthesia: none  Anesthetic total: 0 ml  Irrigation method: syringe Amount of cleaning: standard  Skin closure: Superficial  Number of sutures: 2 staples  Technique: Wound irrigated copiously with sterile saline. Wound then cleaned with Betadine and draped in sterile fashion. Wound closed using 2 staples. Good wound approximation and hemostasis achieved.    Patient tolerance: Patient tolerated the procedure well with no immediate complications.    IMPRESSION / MDM / ASSESSMENT AND PLAN / ED COURSE  I reviewed the triage vital signs and the nursing notes.  Patient here after unwitnessed fall.  Unfortunately there is no one available to give me history as his daughter is not answering the phone and his wife also has dementia.  The patient is on the cardiac monitor to evaluate for evidence of arrhythmia and/or significant heart rate changes.   DIFFERENTIAL DIAGNOSIS (includes but not limited to):   Head  injury, scalp laceration, concussion, intracranial hemorrhage, skull fracture, cervical spine fracture  Patient's presentation is most consistent with acute presentation with potential threat to life or bodily function.  PLAN: We will obtain CT of the head and cervical spine as it does appear that he is on Eliquis for history of PE.  He is hypertensive here but is yelling out repeatedly.  He has been moved into another room so that we can watch him more closely.  We will monitor and recheck his blood pressure.  Will give Tylenol for pain control.  It appears his last tetanus vaccine was in 2020.   MEDICATIONS GIVEN IN ED: Medications  acetaminophen (TYLENOL) tablet 1,000 mg (1,000 mg Oral Given 12/05/21 0522)     ED COURSE: 5:49 AM  Blood pressures continue to be significantly elevated despite him now being calm and no longer yelling.  Pressure currently is 177/113.  On review of his records, this does not appear to be what his baseline is.  He has had intermittent recordings of 1 or 2 elevated blood pressures in the last several visits but normally he is in the 824M to 353I systolic.  I am unable to confirm with him if he is compliant with his medications and his wife is not able to provide any history due to her dementia.  We will give IV labetalol and check CBC, CMP, troponin, urinalysis.  He is on cardiac monitoring.  Still unable to get in touch with any family member that can provide [place] with further information.   6:09 AM  Spoke with patient's daughter by phone.  She states that she does not have any acute concerns for him at this time.  She was not able to provide any further information as she states that her mother is not able to provide any further information due to her dementia.  She states that her father takes care of his own medication daily.  She agrees to imaging, blood work and urinalysis and states that when he is ready for discharge that she can be contacted to pick him up.  6:48  AM  Labs show no leukocytosis.  Stable anemia.  Urine shows small leukocytes and some red and white blood cells but rare bacteria and nitrite negative.  I suspect that this is all from having a catheter in place rather than a true UTI.  Will send urine culture but hold antibiotics at this time.  He is afebrile and has no leukocytosis or confusion from his baseline.  No significant electrolyte derangement.  Normal glucose.  Opponent is 20 which is his baseline.  I do not feel this needs to be repeated as his EKG is nonischemic and he has not complained of chest pain or difficulty breathing.  CT head and cervical spine show scalp hematoma but no other acute abnormality when reviewed and interpreted by myself and the radiologist.  We will contact his daughter for pickup.  CONSULTS: Admission considered but daughter states patient is at his baseline and work-up today has been reassuring.   OUTSIDE RECORDS REVIEWED:  Reviewed patient's last cardiology note on 10/28/2021.       FINAL CLINICAL IMPRESSION(S) / ED DIAGNOSES   Final diagnoses:  Fall, initial encounter  Injury of head, initial encounter  Laceration of scalp, initial encounter     Rx / DC Orders   ED Discharge Orders     None        Note:  This document was prepared using Dragon voice recognition software and may include unintentional dictation errors.   Jermika Olden, Delice Bison, DO 12/05/21 [phone]

## 2021-12-05 NOTE — TOC Initial Note (Signed)
Transition of Care [facility]) - Initial/Assessment Note    Patient Details  Name: Justin Griffith MRN: [id number] Date of Birth: Sep 30, 1940  Transition of Care [facility]) CM/SW Contact:    Magnus Ivan, LCSW Phone Number: 12/05/2021, 3:04 PM  Clinical Narrative:                 Per chart review, patient not fully oriented. Spoke to daughter [name] via phone. She was at bedside during conversation. [name] stated patient lives with his wife who has Dementia. He is her caregiver, she will be staying with family while patient is in the hospital/rehab. Belinda confirmed they are considering ALF placement, but want to know how patient does with SNF to see if he can go back home or needs ALF. Explained that benefits check was requested but also encouraged family to reach out to the [name] directly to see what benefits patient has, Belinda verbalized understanding and stated she will let patient's step daughter Lenna Sciara know as well (she left a VM about this). [name] stated patient goes to [facility]. He also sees Dr. Neomia Dear for primary care. Patient has  walker at home. Patient was set up with [facility] recently, but they were supposed to start today and patient was hospitalized, unsure of agency.  Patient has been to [facility] and [facility]. Patient signed himself out of [facility] but is agreeable to rehab and prefers to go back there if possible. CSW started SNF work up.  Expected Discharge Plan: Skilled Nursing Facility Barriers to Discharge: Continued Medical Work up   Patient Goals and CMS Choice Patient states their goals for this hospitalization and ongoing recovery are:: SNF CMS Medicare.gov Compare Post Acute Care list provided to:: Patient Choice offered to / list presented to : Patient  Expected Discharge Plan and Services Expected Discharge Plan: [place] arrangements for the past 2 months: Single Family Home                                       Prior Living Arrangements/Services Living arrangements for the past 2 months: Single Family Home Lives with:: Spouse Patient language and need for interpreter reviewed:: Yes Do you feel safe going back to the place where you live?: Yes      Need for Family Participation in Patient Care: Yes (Comment) Care giver support system in place?: Yes (comment) Current home services: DME Criminal Activity/Legal Involvement Pertinent to Current Situation/Hospitalization: No - Comment as needed  Activities of Daily Living      Permission Sought/Granted Permission sought to share information with : Chartered certified accountant granted to share information with : Yes, Verbal Permission Granted (by daughter [name])  Share Information with NAME: Rozell Searing - step daughter of patient  Permission granted to share info w AGENCY: SNFs        Emotional Assessment       Orientation: : Oriented to Self Alcohol / Substance Use: Not Applicable Psych Involvement: No (comment)  Admission diagnosis:  Weakness [R53.1] Inability to walk [R26.2] Unable to stand up [R68.89] Unable to balance when standing [R27.8] Injury of head, initial encounter [S09.90XA] Fall, initial encounter [W19.XXXA] Laceration of scalp, initial encounter [S01.01XA] Unable to ambulate [R26.2] Patient Active Problem List   Diagnosis Date Noted   Unable to ambulate 12/05/2021   Lower abdominal pain 11/23/2021   Pressure injury of  sacral region, unstageable ([place])    Septic shock ([place]) 11/18/2021   Lobar pneumonia ([place]) 11/18/2021   Acute kidney injury superimposed on chronic kidney disease ([place]) 11/18/2021   Acute lower UTI 11/18/2021   Type 2 diabetes mellitus with chronic kidney disease, without long-term current use of insulin ([place]) 11/18/2021   Hyperkalemia    Recurrent falls 10/31/2021   Stroke ([place]) 10/31/2021   HLD (hyperlipidemia) 10/31/2021   Type II diabetes mellitus with renal  manifestations ([place]) 10/31/2021   CAD (coronary artery disease) 10/31/2021   Chronic diastolic CHF (congestive heart failure) ([place]) 10/31/2021   Right elbow pain 10/15/2021   Gross hematuria 10/12/2021   Chronic anticoagulation 10/12/2021   Stage 3b chronic kidney disease (CKD) ([place]) 10/12/2021   History of pulmonary embolism 09/18/21 10/06/2021   Essential hypertension 09/19/2021   Dyslipidemia 09/19/2021   Generalized weakness 06/25/2021   Lymphedema 03/17/2021   Headache disorder 02/27/2021   Falls frequently 11/28/2020   Aortic atherosclerosis ([place]) 06/12/2020   History of embolic stroke 95/63/8756   At high risk for falls 05/08/2020   History of cold sores 05/06/2020   Hyponatremia 03/29/2020   History of non-ST elevation myocardial infarction (NSTEMI) 03/28/2020   Elevated TSH 02/25/2020   Poor mobility 02/02/2020   Coronary artery disease 01/23/2020   History of CVA (cerebrovascular accident) 01/23/2020   Memory changes 01/01/2020   Chronic venous insufficiency 05/23/2019   B12 deficiency 04/23/2019   Constipation 04/21/2019   Tremor 11/14/2018   Obesity (BMI 30-39.9) 09/07/2017   Chronic obstructive pulmonary disease (COPD) ([place]) 04/29/2017   Intertrigo 02/02/2017   BPH with obstruction/lower urinary tract symptoms Foley dependent 07/23/2016   Advanced care planning/counseling discussion 43/32/9518   Eosinophilic esophagitis 84/16/6063   PAD (peripheral artery disease) ([place]) 01/08/2016   GERD without esophagitis 01/06/2016   Hyperlipidemia associated with type 2 diabetes mellitus ([place]) 01/16/2015   Sleep apnea 01/16/2015   BMI 40.0-44.9, adult ([place]) 01/16/2015   Hypertension associated with diabetes ([place]) 10/11/2014   Type 2 diabetes mellitus with proteinuria ([place]) 10/11/2014   Back pain 10/11/2014   History of colon cancer    PCP:  Charlynne Cousins, MD (Inactive) Pharmacy:   [facility], [address] [place]  [address] Phone: [phone] Fax: [place], [place] [place]. [place] [address] Phone: [phone] Fax: [phone]  [id number] [place] [address]: [phone]: [phone]    Social Determinants of Health (SDOH) Interventions    Readmission Risk Interventions    11/24/2021   10:33 AM 09/21/2021   10:42 AM 05/21/2020   10:01 AM  Readmission Risk Prevention Plan  Transportation Screening Complete Complete Complete  PCP or Specialist Appt within 3-5 Days  Complete Complete  HRI or Home Care Consult  Complete Complete  Social Work Consult for [place]oe[place]anning/Counseling  Complete Complete  Palliative Care Screening  Not Applicable Not Applicable  Medication Review (Press photographer Complete Complete Complete  PCP or Specialist appointment within 3-5 days of discharge Complete    HRI or Home Care Consult Complete    SW Recovery Care/Counseling Consult Complete    Palliative Care Screening Not [place]

## 2021-12-05 NOTE — ED Notes (Signed)
Per MD, hold Labetalol for time being

## 2021-12-05 NOTE — ED Notes (Signed)
Daughter contacted at this time by this RN for patient pick up. Daughter states she will come to the ED shortly to pick him up. Patient has dementia and cannot wait in lobby- daughter instructed to come into lobby so we can bring the patient out

## 2021-12-05 NOTE — Progress Notes (Signed)
Patient refusing cpap.

## 2021-12-05 NOTE — Evaluation (Signed)
Occupational Therapy Evaluation Patient Details Name: Justin Griffith MRN: [id number] DOB: Feb 25, 1941 Today's Date: 12/05/2021   History of Present Illness Patient is a 81 year old male with  history of dementia, hypertension, hyperlipidemia, COPD, diabetes, PE on Eliquis who presents to the emergency department EMS after he had an unwitnessed fall at home. CT of head Large right posterior convexity scalp hematoma/laceration no underlying skull fracture   Clinical Impression   Chart reviewed, pt greeted in bed agreeable to OT evaluation. Co tx completed with PT on this date. Pt is oriented to self, place, situation, grossly oriented to date. Pt presents with impairments in safety awareness and awareness of deficits. Pt first reports he is agreeable to rehab, then states to his family via the phone he is going home tomorrow. Pt endorses he recently discharged from rehab and was amb at home wit a RW. Pt presents with deficits in strength, endurance, activity tolerance, cognition all affecting safe and optimal ADL completion. MOD A +2 required for bed mobility, STS with MAX A +2 multiple attempts with vcs required throughout for safety. Pt performs grooming tasks with SET UP and vcs. Pt appears to require increased assist for all ADL/mobility compared to PLOF, would benefit from STR to address functional deficits and to facilitate safe and optimal ADL completion.      Recommendations for follow up therapy are one component of a multi-disciplinary discharge planning process, led by the attending physician.  Recommendations may be updated based on patient status, additional functional criteria and insurance authorization.   Follow Up Recommendations  Skilled nursing-short term rehab (<3 hours/day)    Assistance Recommended at Discharge Frequent or constant Supervision/Assistance  Patient can return home with the following Two people to help with walking and/or transfers;Two people to help with  bathing/dressing/bathroom;Assistance with cooking/housework;Assist for transportation;Help with stairs or ramp for entrance    Functional Status Assessment  Patient has had a recent decline in their functional status and demonstrates the ability to make significant improvements in function in a reasonable and predictable amount of time.  Equipment Recommendations  Wheelchair (measurements OT)    Recommendations for Other Services       Precautions / Restrictions Precautions Precautions: Fall Restrictions Weight Bearing Restrictions: No      Mobility Bed Mobility Overal bed mobility: Needs Assistance Bed Mobility: Supine to Sit, Sit to Supine Rolling: Max assist   Supine to sit: Mod assist, +2 for physical assistance Sit to supine: Mod assist, +2 for physical assistance        Transfers Overall transfer level: Needs assistance Equipment used: Rolling walker (2 wheels) Transfers: Sit to/from Stand Sit to Stand: Max assist, +2 physical assistance           General transfer comment: frequent vcs for technique, safety. 2 STS attempts with RW, +2      Balance Overall balance assessment: Needs assistance Sitting-balance support: Feet supported Sitting balance-Leahy Scale: Fair     Standing balance support: Bilateral upper extremity supported Standing balance-Leahy Scale: Zero                             ADL either performed or assessed with clinical judgement   ADL Overall ADL's : Needs assistance/impaired     Grooming: Wash/dry face;Sitting;Set up           Upper Body Dressing : Moderate assistance Upper Body Dressing Details (indicate cue type and reason): gown Lower Body Dressing: Maximal  assistance                       Vision Patient Visual Report: No change from baseline       Perception     Praxis      Pertinent Vitals/Pain Pain Assessment Pain Assessment: No/denies pain     Hand Dominance     Extremity/Trunk  Assessment Upper Extremity Assessment Upper Extremity Assessment: Generalized weakness   Lower Extremity Assessment Lower Extremity Assessment: Generalized weakness       Communication Communication Communication: HOH   Cognition Arousal/Alertness: Awake/alert Behavior During Therapy: Anxious Overall Cognitive Status: No family/caregiver present to determine baseline cognitive functioning Area of Impairment: Orientation, Attention, Memory, Following commands, Safety/judgement, Awareness, Problem solving                 Orientation Level: Disoriented to, Time Current Attention Level: Sustained Memory: Decreased short-term memory Following Commands: Follows one step commands with increased time Safety/Judgement: Decreased awareness of safety, Decreased awareness of deficits Awareness: Emergent Problem Solving: Slow processing, Requires verbal cues, Requires tactile cues, Decreased initiation, Difficulty sequencing       General Comments  patient is anxious about calling his spouse on the phone. he first states that his goal is to get to rehab, and then on the phone he tells his family member that he wants to go home tomorrow using EMS.    Exercises     Shoulder Instructions      Home Living Family/patient expects to be discharged to:: Private residence Living Arrangements: Spouse/significant other Available Help at Discharge: Family Type of Home: House Home Access: Stairs to enter Technical brewer of Steps: 2   Home Layout: One level     Bathroom Shower/Tub: Occupational psychologist: Standard     Home Equipment: Conservation officer, nature (2 wheels)   Additional Comments: info from chart, pt is a poor historian      Prior Functioning/Environment Prior Level of Function : Independent/Modified Independent             Mobility Comments: pt reports he uses RW and cane, recently discharged from rehab ADLs Comments: pt reports he cares for his wife,  assist with IADLs        OT Problem List: Decreased strength;Decreased range of motion;Decreased activity tolerance;Impaired balance (sitting and/or standing);Decreased safety awareness      OT Treatment/Interventions: Self-care/ADL training;Therapeutic exercise;Energy conservation;DME and/or AE instruction;Therapeutic activities;Patient/family education;Balance training    OT Goals(Current goals can be found in the care plan section) Acute Rehab OT Goals Patient Stated Goal: go home (pt previously stated he wanted to go to rehab) OT Goal Formulation: With patient Time For Goal Achievement: 12/19/21 Potential to Achieve Goals: Fair ADL Goals Pt Will Perform Grooming: standing;sitting;with supervision Pt Will Perform Lower Body Dressing: with mod assist;with adaptive equipment Pt Will Transfer to Toilet: with min assist;bedside commode Pt Will Perform Toileting - Clothing Manipulation and hygiene: with min assist;sit to/from stand  OT Frequency: Min 2X/week    Co-evaluation PT/OT/SLP Co-Evaluation/Treatment: Yes Reason for Co-Treatment: Complexity of the patient's impairments (multi-system involvement);For patient/therapist safety;To address functional/ADL transfers PT goals addressed during session: Mobility/safety with mobility OT goals addressed during session: ADL's and self-care      AM-PAC OT "6 Clicks" Daily Activity     Outcome Measure Help from another person eating meals?: A Little Help from another person taking care of personal grooming?: A Lot Help from another person toileting, which includes using toliet, bedpan,  or urinal?: A Lot Help from another person bathing (including washing, rinsing, drying)?: A Lot Help from another person to put on and taking off regular upper body clothing?: A Little Help from another person to put on and taking off regular lower body clothing?: A Lot 6 Click Score: 14   End of Session Equipment Utilized During Treatment: Rolling walker  (2 wheels)  Activity Tolerance: Patient tolerated treatment well Patient left: in bed;with call bell/phone within reach;with bed alarm set  OT Visit Diagnosis: Unsteadiness on feet (R26.81);Other abnormalities of gait and mobility (R26.89);Muscle weakness (generalized) (M62.81);History of falling (Z91.81);Pain                Time: 6503-5465 OT Time Calculation (min): 20 min Charges:  OT General Charges $OT Visit: 1 Visit OT Evaluation $OT Eval Low Complexity: 1 Low  Shanon Payor, OTD OTR/L  12/05/21, 3:50 PM

## 2021-12-05 NOTE — Assessment & Plan Note (Signed)
Stable with no increased WOB, no wheezing.  Plan Continue home regimen

## 2021-12-05 NOTE — H&P (Signed)
History and Physical    Justin Griffith FTD:[id number] DOB: [birth year] DOA: 12/05/2021  DOS: the patient was seen and examined on 12/05/2021  PCP: Charlynne Cousins, MD (Inactive)   Patient coming from: Home  I have personally briefly reviewed patient's old medical records in [facility]  Justin Griffith, an 81 y/o history of dementia, hypertension, hyperlipidemia, COPD, diabetes, PE on Eliquis who presents to the emergency department EMS after he had an unwitnessed fall at home.  Patient complaining of headache. He has h/o frequent falls. EMS exam revealed scalp laceration. He is transported to [facility] for evaluation.      ED Course: Seen by EDP: CT C-spine with multi-level DJD with significant stenosis at C4-5, C5-6, CT brain w/o acute injury. Laceration on scalp close with 2 staples. Patient lives with his wife who has advanced dementia. He is unable to be discharged home due to very poor balance, inability to ambulate safely, inadequate care at home to manage his mobility issues safely given h/o multiple falls while fully anti-coagulated on Eliquis.   Patient with indwelling foley catheter. U/A mildly positive after 6 days of clindamycin. 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   TRH called to admit patient for further evaluation and management of medical problems, develop safe discharge plan.  Review of Systems:  Review of Systems  Constitutional:  Negative for chills, fever and weight loss.  HENT:  Positive for hearing loss. Negative for congestion and ear discharge.   Eyes:  Negative for blurred vision, double vision and discharge.  Respiratory:  Negative for cough, shortness of breath and wheezing.   Cardiovascular:  Negative for chest pain and palpitations.  Gastrointestinal:  Positive for vomiting. Negative for abdominal pain (suprapublic tenderness which is chronic attributed to indwelling catheter), heartburn and nausea.  Genitourinary:  Negative for dysuria.  Musculoskeletal:   Positive for back pain and falls.  Skin: Negative.   Neurological:  Positive for sensory change (admits to peripheral neuropathy), weakness and headaches.  Endo/Heme/Allergies: Negative.   Psychiatric/Behavioral: Negative.      Past Medical History:  Diagnosis Date   Acute urinary retention    Arthritis    Asthma 2000   Back pain    Colon polyp 2012   COPD (chronic obstructive pulmonary disease) (HCC)    Emphysema of lung (HCC)    Heart murmur    Hernia 2000   Hyperlipidemia    Hypertension    Personal history of malignant neoplasm of large intestine    Personal history of tobacco use, presenting hazards to health    Sleep apnea    Special screening for malignant neoplasms, colon    Type 2 diabetes mellitus with proteinuria ([place]) 10/11/2014    Past Surgical History:  Procedure Laterality Date   CARDIAC CATHETERIZATION Left 09/25/2015   Procedure: Left Heart Cath and Coronary Angiography;  Surgeon: Yolonda Kida, MD;  Location: [place] [facility];  Service: Cardiovascular;  Laterality: Left;   CHOLECYSTECTOMY  1970   COLON SURGERY  07-16-99   sigmoid colon resection with primary anastomosis, chemotherapy for metastatic disease   COLONOSCOPY  2001, 2012   Dr Bary Castilla, tubular adenoma of the cecum and ascending colon in 2012.   COLONOSCOPY WITH PROPOFOL N/A 02/12/2016   Procedure: COLONOSCOPY WITH PROPOFOL;  Surgeon: Robert Bellow, MD;  Location: [facility] ENDOSCOPY;  Service: Endoscopy;  Laterality: N/A;   CORONARY STENT INTERVENTION N/A 05/17/2020   Procedure: CORONARY STENT INTERVENTION;  Surgeon: Yolonda Kida, MD;  Location: [place] [facility];  Service: Cardiovascular;  Laterality: N/A;  LAD   ESOPHAGOGASTRODUODENOSCOPY (EGD) WITH PROPOFOL N/A 02/12/2016   Procedure: ESOPHAGOGASTRODUODENOSCOPY (EGD) WITH PROPOFOL;  Surgeon: Robert Bellow, MD;  Location: [facility] ENDOSCOPY;  Service: Endoscopy;  Laterality: N/A;   HERNIA REPAIR Right    right inguinal hernia repair    JOINT REPLACEMENT Bilateral    knee replacement   KNEE SURGERY Bilateral 2010   LEFT HEART CATH AND CORONARY ANGIOGRAPHY N/A 05/17/2020   Procedure: LEFT HEART CATH AND CORONARY ANGIOGRAPHY possible PCI and stent;  Surgeon: Yolonda Kida, MD;  Location: [place] [facility];  Service: Cardiovascular;  Laterality: N/A;   MYRINGOTOMY WITH TUBE PLACEMENT Bilateral 08/16/2014   Procedure: MYRINGOTOMY WITH TUBE PLACEMENT;  Surgeon: Carloyn Manner, MD;  Location: [facility] ORS;  Service: ENT;  Laterality: Bilateral;   PERIPHERAL VASCULAR CATHETERIZATION Right 09/04/2015   Procedure: Lower Extremity Angiography;  Surgeon: Katha Cabal, MD;  Location: [place] [facility];  Service: Cardiovascular;  Laterality: Right;   PERIPHERAL VASCULAR CATHETERIZATION  09/04/2015   Procedure: Lower Extremity Intervention;  Surgeon: Katha Cabal, MD;  Location: [place] [facility];  Service: Cardiovascular;;   TONSILLECTOMY     TYMPANOSTOMY TUBE PLACEMENT      Soc Hx - married, lives with his wife who has alzheimers dementia. Has a daughter who is priimary care giver/monitor. 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    reports that he quit smoking about 32 years ago. His smoking use included cigarettes. He has a 30.00 pack-year smoking history. He has been exposed to tobacco smoke. He has never used smokeless tobacco. He reports that he does not drink alcohol and does not use drugs.  Allergies  Allergen Reactions   Farxiga [Dapagliflozin] Rash    Causes a severe rash in groin area    Dulaglutide Diarrhea and Other (See Comments)   Liraglutide Diarrhea and Other (See Comments)   Jardiance [Empagliflozin] Rash    Family History  Problem Relation Age of Onset   Diabetes Father    Esophageal cancer Mother    Alzheimer's disease Paternal Uncle     Prior to Admission medications   Medication Sig Start Date End Date Taking? Authorizing Provider  acetaminophen (TYLENOL) 325 MG  tablet Take 650 mg by mouth every 6 (six) hours as needed.    [provider]  aluminum-magnesium hydroxide-simethicone (MAALOX) 200-200-20 MG/5ML SUSP Take 30 mLs by mouth 4 (four) times daily -  before meals and at bedtime.    [provider]  apixaban (ELIQUIS) 2.5 MG TABS tablet Take 1 tablet (2.5 mg total) by mouth 2 (two) times daily. 11/24/21   Loletha Grayer, MD  ascorbic acid (VITAMIN C) 500 MG tablet Take 1 tablet (500 mg total) by mouth daily. 11/24/21   Wieting, Richard, MD  BREZTRI AEROSPHERE 160-9-4.8 MCG/ACT AERO Inhale 1 puff into the lungs daily. 08/19/21   [provider]  carvedilol (COREG) 3.125 MG tablet Take 1 tablet (3.125 mg total) by mouth 2 (two) times daily with a meal. 11/04/21   Fritzi Mandes, MD  clindamycin (CLEOCIN) 300 MG capsule Take 1 capsule (300 mg total) by mouth 3 (three) times daily for 7 days. 11/29/21 12/06/21  Arta Silence, MD  cloNIDine (CATAPRES) 0.1 MG tablet Take 0.1 mg by mouth 2 (two) times daily. 12/16/20   [provider]  cyanocobalamin 1000 MCG tablet Take 1 tablet (1,000 mcg total) by mouth at bedtime. 11/04/21   Fritzi Mandes, MD  feeding supplement, GLUCERNA SHAKE, (GLUCERNA SHAKE) LIQD Take 237 mLs by mouth 3 (three) times daily between meals. 11/24/21   Loletha Grayer, MD  gabapentin (NEURONTIN) 100 MG capsule Take 1 capsule (100 mg total) by mouth 3 (three) times daily. 11/29/21 12/29/21  Arta Silence, MD  hydrALAZINE (APRESOLINE) 50 MG tablet TAKE ONE TABLET BY MOUTH THREE TIMES A DAY FOR BLOOD PRESSURE 12/16/20   [provider]  insulin aspart (NOVOLOG) 100 UNIT/ML injection For glucose 121-150 give 1 unit subcutaneous insulin; 151- 200: 2 units; 201 to 250: 3 units; 251-300: 5 units; 301 to 350: 7 units; 351 and above 9 units. 11/24/21   Loletha Grayer, MD  insulin glargine-yfgn (SEMGLEE) 100 UNIT/ML Pen Inject 5 Units into the skin at bedtime. 11/24/21   Loletha Grayer, MD  magnesium oxide (MAG-OX)  400 (240 Mg) MG tablet Take 1 tablet (400 mg total) by mouth daily. 06/27/21   Lorella Nimrod, MD  Multiple Vitamin (MULTIVITAMIN WITH MINERALS) TABS tablet Take 1 tablet by mouth daily. 11/24/21   Loletha Grayer, MD  pantoprazole (PROTONIX) 40 MG tablet Take 40 mg by mouth daily.    [provider]  rosuvastatin (CRESTOR) 10 MG tablet Take 1 tablet (10 mg total) by mouth at bedtime. 11/24/21   Loletha Grayer, MD  Tiotropium Bromide Monohydrate (SPIRIVA RESPIMAT) 2.5 MCG/ACT AERS Inhale 2 puffs into the lungs daily. 05/15/20   Vigg, Avanti, MD  Vitamin D, Cholecalciferol, 25 MCG (1000 UT) CAPS Take 2,000 mcg by mouth at bedtime.    [provider]    Physical Exam: Vitals:   12/05/21 0610 12/05/21 0723 12/05/21 0800 12/05/21 0830  BP: (!) 174/99 (!) 179/94 (!) 207/116 (!) 192/109  Pulse: 92 91 100 88  Resp: '19 16 20 20  '$ Temp:      TempSrc:      SpO2: 98% 97% 97% 96%  Weight:        Physical Exam Vitals and nursing note reviewed.  Constitutional:      Appearance: He is obese. He is ill-appearing. He is not toxic-appearing.     Comments: Disheveled appearance  HENT:     Head:     Comments: Bloody posterior scalp. Laceration repair with staples noted    Nose: Nose normal.     Mouth/Throat:     Mouth: Mucous membranes are moist.     Comments: Full dentures in place. NO gum lesions, no buccal lesions Eyes:     Extraocular Movements: Extraocular movements intact.     Conjunctiva/sclera: Conjunctivae normal.     Pupils: Pupils are equal, round, and reactive to light.     Comments: Right eyelid droop but able to open eye fully on command.   Neck:     Vascular: No carotid bruit.  Cardiovascular:     Rate and Rhythm: Normal rate and regular rhythm.     Comments: 2+ radial pulse, 1+ to trace DP pulses. Quiet precordium. Pulmonary:     Effort: Pulmonary effort is normal. No respiratory distress.     Breath sounds: No wheezing or rales.  Abdominal:     General: There is  no distension.     Palpations: Abdomen is soft.     Tenderness: There is no abdominal tenderness (lower quadrant/suprapubic tenderness). There is no guarding.  Genitourinary:    Penis: Normal.      Comments: Foley catheter in place Musculoskeletal:     Cervical back: Normal range of motion and neck supple. No rigidity.  Right lower leg: No edema (sock depression, no pitting edema).     Left lower leg: No edema (sock depression, no pitting edema).  Skin:    General: Skin is warm and dry.     Coloration: Skin is not jaundiced.     Findings: No bruising.     Comments: Mild erythema under panniculus and in groin. Unable to roll to exam sacrum.  Neurological:     Mental Status: He is alert and oriented to person, place, and time.     Cranial Nerves: No cranial nerve deficit.     Motor: Weakness present.     Gait: Gait abnormal (by report - required 2 person assist to stand, unable to ambulate independently by EDP report).     Comments: Knows he is in ED, able to relate home situation, give a work history. Speech clear. Right lid lag but able to fully open eye to command.  Psychiatric:        Mood and Affect: Mood normal.        Behavior: Behavior normal.      Labs on Admission: I have personally reviewed following labs and imaging studies  CBC: Recent Labs  Lab 11/29/21 2111 12/05/21 0616  WBC 6.3 7.3  NEUTROABS 4.0 5.1  HGB 11.3* 11.9*  HCT 34.9* 36.8*  MCV 89.9 90.4  PLT 332 470   Basic Metabolic Panel: Recent Labs  Lab 11/29/21 2111 12/05/21 0616  NA 138 141  K 3.9 3.4*  CL 109 108  CO2 25 26  GLUCOSE 151* 157*  BUN 18 13  CREATININE 1.20 1.14  CALCIUM 10.0 10.6*   GFR: Estimated Creatinine Clearance: 63 mL/min (by C-G formula based on SCr of 1.14 mg/dL). Liver Function Tests: Recent Labs  Lab 12/05/21 0616  AST 23  ALT 24  ALKPHOS 57  BILITOT 0.5  PROT 7.2  ALBUMIN 3.2*   No results for input(s): "LIPASE", "AMYLASE" in the last 168 hours. No  results for input(s): "AMMONIA" in the last 168 hours. Coagulation Profile: No results for input(s): "INR", "PROTIME" in the last 168 hours. Cardiac Enzymes: No results for input(s): "CKTOTAL", "CKMB", "CKMBINDEX", "TROPONINI" in the last 168 hours. BNP (last 3 results) No results for input(s): "PROBNP" in the last 8760 hours. HbA1C: No results for input(s): "HGBA1C" in the last 72 hours. CBG: No results for input(s): "GLUCAP" in the last 168 hours. Lipid Profile: No results for input(s): "CHOL", "HDL", "LDLCALC", "TRIG", "CHOLHDL", "LDLDIRECT" in the last 72 hours. Thyroid Function Tests: No results for input(s): "TSH", "T4TOTAL", "FREET4", "T3FREE", "THYROIDAB" in the last 72 hours. Anemia Panel: No results for input(s): "VITAMINB12", "FOLATE", "FERRITIN", "TIBC", "IRON", "RETICCTPCT" in the last 72 hours. Urine analysis:    Component Value Date/Time   COLORURINE YELLOW (A) 12/05/2021 0616   APPEARANCEUR HAZY (A) 12/05/2021 0616   APPEARANCEUR Cloudy (A) 09/18/2021 1421   LABSPEC 1.010 12/05/2021 0616   PHURINE 5.0 12/05/2021 0616   GLUCOSEU 50 (A) 12/05/2021 0616   HGBUR SMALL (A) 12/05/2021 0616   BILIRUBINUR NEGATIVE 12/05/2021 0616   BILIRUBINUR Negative 09/18/2021 1421   KETONESUR NEGATIVE 12/05/2021 0616   PROTEINUR 100 (A) 12/05/2021 0616   NITRITE NEGATIVE 12/05/2021 0616   LEUKOCYTESUR SMALL (A) 12/05/2021 0616    Radiological Exams on Admission: I have personally reviewed images CT Cervical Spine Wo Contrast  Result Date: 12/05/2021 CLINICAL DATA:  81 year old male status post unwitnessed fall at home. Posterior laceration. EXAM: CT CERVICAL SPINE WITHOUT CONTRAST TECHNIQUE: Multidetector CT imaging of  the cervical spine was performed without intravenous contrast. Multiplanar CT image reconstructions were also generated. RADIATION DOSE REDUCTION: This exam was performed according to the departmental dose-optimization program which includes automated exposure control,  adjustment of the mA and/or kV according to patient size and/or use of iterative reconstruction technique. COMPARISON:  CT head today.  Cervical spine CT 10/30/2021. FINDINGS: Alignment: Chronic straightening of cervical lordosis. Cervicothoracic junction alignment is within normal limits. Bilateral posterior element alignment is within normal limits. Skull base and vertebrae: Visualized skull base is intact. No atlanto-occipital dissociation. C1 and C2 appear stable and intact. Chronic anterior odontoid subchondral cyst. No acute osseous abnormality identified. Soft tissues and spinal canal: No prevertebral fluid or swelling. No visible canal hematoma. Stable noncontrast neck soft tissues including bulky calcified carotid atherosclerosis. Disc levels: Chronic severe cervical spine degeneration. Bulky facet degeneration, disc and endplate degeneration. Multilevel cervical spinal stenosis appears stable and maximal at C4-C5 and C5-C6. Upper chest: Visible upper thoracic levels appear intact. Negative lung apices and noncontrast thoracic inlet. Other: Chronically absent dentition. Visible facial bones appear intact. IMPRESSION: 1. No acute traumatic injury identified in the cervical spine. 2. Chronic severe cervical spine degeneration. Stable CT appearance of multilevel cervical spinal stenosis, maximal at C4-C5 and C5-C6. Electronically Signed   By: Genevie Ann M.D.   On: 12/05/2021 06:18   CT HEAD WO CONTRAST (5MM)  Result Date: 12/05/2021 CLINICAL DATA:  81 year old male status post unwitnessed fall at home. Posterior laceration. EXAM: CT HEAD WITHOUT CONTRAST TECHNIQUE: Contiguous axial images were obtained from the base of the skull through the vertex without intravenous contrast. RADIATION DOSE REDUCTION: This exam was performed according to the departmental dose-optimization program which includes automated exposure control, adjustment of the mA and/or kV according to patient size and/or use of iterative  reconstruction technique. COMPARISON:  Head CT 11/17/2021. FINDINGS: Brain: Stable cerebral volume. Chronic infarct in the left cerebellum PICA territory, confluent bilateral cerebral white matter hypodensity appears stable since last month. Deep gray matter nuclei appear stable with asymmetric heterogeneous hypodensity in the left thalamus, and the right caudothalamic groove. No midline shift, ventriculomegaly, mass effect, evidence of mass lesion, intracranial hemorrhage or evidence of cortically based acute infarction. Vascular: Calcified atherosclerosis at the skull base. Skull: No skull fracture identified. Sinuses/Orbits: Chronic mastoid sclerosis is stable. Tympanic cavities remain clear. Visualized paranasal sinuses are stable and well aerated. Other: Broad-based posterior convexity scalp hematoma on the right. Skin staples and small volume soft tissue gas there. Underlying calvarium appears stable and intact. Other orbit and scalp soft tissues appears stable. IMPRESSION: 1. Large right posterior convexity scalp hematoma/laceration. No underlying skull fracture. 2. No acute intracranial abnormality. 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 Electronically Signed   By: Genevie Ann M.D.   On: 12/05/2021 06:14    EKG: I have personally reviewed EKG: Sinus rhythm, ?leftr anterior fasicular block, no ST depression, ? Old inferior injury.   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  Plan Will continue basal insulin, increasing to 10u qHS  SS coverage  Essential hypertension BP running high. Had received labetolol in ED. Asymptomatic  Plan Continue home regimen  Hydralazine q4 prn SBP ?18-, DBP >468  Chronic diastolic CHF (congestive heart failure) (HCC) Last ECHO 09/20/21 EF 03-21%, diastolic difficult to determine with obesity and COPD. Carries this diagnosis. Seems well compensated at time of admission.   Plan BNP ordered  Continue home meds  Stage 3b chronic kidney disease (CKD) (HCC) Stable Cr per record.  Plan Avoid nephrotoxic medications  Sleep apnea Per history on CPAP.  Plan Continue CPAP qHS - respiratory consult  Acute lower UTI Patient completing course of clindamycin. At high risk for chronic colonization and recurrent infection.  Plan D/c clindamycin  Trimethoprim 100 mg daily prophylaxis  Recurrent falls Long term problem. Patient with peripheral neuropathy - contributing factor. Question of symptomatic spinal stenosis.  Plan PT/OT evaluation  TOC consult re: HH needs vs SNF  Chronic obstructive pulmonary disease (COPD) ([employer]) Stable with no increased WOB, no wheezing.  Plan Continue home regimen  Dyslipidemia Continue home meds. Has had recent lipid panel.  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  Memory changes Per family pateint with some memory issues. By his report he has been able to manage ADLs and provide care for his wife who has Alzheimers. Daughter reports she has been primary care giver for both her parents.   Plan TOC consult re: HH needs vs SNF/long term care       DVT prophylaxis: Eliquis Code Status: DNR/DNI(Do NOT Intubate)-confirmed with daughter Family Communication: spoke with Mrs. Alford Highland, daughter. She understands need for admit, Tx plan and  possible placement  Disposition Plan: TBD  Consults called: none  Admission status: Inpatient, Med-Surg   Adella Hare, MD [facility] 12/05/2021, 9:31 AM

## 2021-12-05 NOTE — ED Triage Notes (Signed)
Unwitnessed fall at home. Laceration to posterior skull. Per EMS they are called to pt home frequently for falls and they were called out earlier tonight for fall. Pt fell again and now presents to ER. Unknown if pt had LOC or daily thinners. Pt with hx of dementia. Wife at home has dementia as well per ems report. Bleeding controlled from laceration at this time. ED MD placed 2 staples to laceration on arrival.   CBG 152

## 2021-12-05 NOTE — Discharge Instructions (Signed)
Please follow-up with your primary care doctor in 1 week to have the 2 staples in your posterior scalp removed.

## 2021-12-05 NOTE — Assessment & Plan Note (Signed)
Last ECHO 09/20/21 EF 29-79%, diastolic difficult to determine with obesity and COPD. Carries this diagnosis. Seems well compensated at time of admission.   Plan BNP ordered  Continue home meds

## 2021-12-05 NOTE — ED Notes (Signed)
Attempted to stand patient to get into wheelchair for discharge. Patient could not stand safely with two assist. Was very weak and leaning to the right. Patient would have fallen without two assist. MD notified and at bedside for second attempt to stand the patient up. Again, patient very weak and agreed patient is unsafe to go home at this time with high risk of fall.   Daughter notified and ok with plan at this time to wait on discharge

## 2021-12-05 NOTE — Assessment & Plan Note (Signed)
BP running high. Had received labetolol in ED. Asymptomatic  Plan Continue home regimen  Hydralazine q4 prn SBP ?18-, DBP >110

## 2021-12-05 NOTE — Evaluation (Signed)
Physical Therapy Evaluation Patient Details Name: Justin Griffith MRN: [id number] DOB: [birth year] Today's Date: 12/05/2021  History of Present Illness  Patient is a 81 year old male with  history of dementia, hypertension, hyperlipidemia, COPD, diabetes, PE on Eliquis who presents to the emergency department EMS after he had an unwitnessed fall at home. CT of head Large right posterior convexity scalp hematoma/laceration no underlying skull fracture.    Clinical Impression  Patient agreeable to PT. He was anxious at times and requesting for therapist to help call his wife on the phone. He reports he was recently discharged from SNF to home and has been using the rolling walker or cane for ambulation. He reports one fall since discharge from rehab but has a history of falls. Today, the patient required +2 person assistance for bed mobility. He was able to stand x 2 bouts with Max A +2 person using rolling walker and cues for technique. He required external support to maintain standing balance with right lean. Fatigue with minimal activity. Recommend PT follow up to maximize independence and decrease caregiver burden. The patient initially was agreeable to rehab, stating his goal was to return to rehab. He then tells a family member on the phone that he would like to discharge home tomorrow using EMS. SNF is recommended at this time as patient is not at his baseline level of functional mobility.      Recommendations for follow up therapy are one component of a multi-disciplinary discharge planning process, led by the attending physician.  Recommendations may be updated based on patient status, additional functional criteria and insurance authorization.  Follow Up Recommendations Skilled nursing-short term rehab (<3 hours/day) Can patient physically be transported by private vehicle: No    Assistance Recommended at Discharge Frequent or constant Supervision/Assistance  Patient can return home with the  following  Two people to help with walking and/or transfers;A lot of help with bathing/dressing/bathroom;Help with stairs or ramp for entrance;Assist for transportation;Assistance with cooking/housework;Direct supervision/assist for medications management    Equipment Recommendations None recommended by PT  Recommendations for Other Services       Functional Status Assessment Patient has had a recent decline in their functional status and demonstrates the ability to make significant improvements in function in a reasonable and predictable amount of time.     Precautions / Restrictions Precautions Precautions: Fall Restrictions Weight Bearing Restrictions: No      Mobility  Bed Mobility Overal bed mobility: Needs Assistance Bed Mobility: Supine to Sit, Sit to Supine Rolling: Max assist   Supine to sit: Mod assist, +2 for physical assistance Sit to supine: Mod assist, +2 for physical assistance        Transfers Overall transfer level: Needs assistance Equipment used: Rolling walker (2 wheels) Transfers: Sit to/from Stand Sit to Stand: Max assist, +2 physical assistance           General transfer comment: verbal cues for technique. multiple standing bouts performed from bed. + 2 person assistance required    Ambulation/Gait               General Gait Details: unable to progress ambulation due to poor standing balance and decreased standing tolerance  Stairs            Wheelchair Mobility    Modified Rankin (Stroke Patients Only)       Balance Overall balance assessment: Needs assistance Sitting-balance support: Feet supported Sitting balance-Leahy Scale: Fair   Postural control: Right lateral lean (in standing)  Standing balance support: Bilateral upper extremity supported Standing balance-Leahy Scale: Zero Standing balance comment: external support required to maintain standing balance                             Pertinent  Vitals/Pain Pain Assessment Pain Assessment: No/denies pain    Home Living Family/patient expects to be discharged to:: Private residence Living Arrangements: Spouse/significant other Available Help at Discharge: Family Type of Home: House Home Access: Stairs to enter   Technical brewer of Steps: 2   Home Layout: One level Home Equipment: Conservation officer, nature (2 wheels) Additional Comments: information is per previous hospital admissions. patient is a poor historian.    Prior Function Prior Level of Function : Independent/Modified Independent             Mobility Comments: patient reports he used his walker or a cane, reports recent discharge from rehab to home where he had only one fall. patient is a poor historian       Hand Dominance        Extremity/Trunk Assessment   Upper Extremity Assessment Upper Extremity Assessment: Generalized weakness    Lower Extremity Assessment Lower Extremity Assessment: Generalized weakness       Communication   Communication: HOH  Cognition Arousal/Alertness: Awake/alert Behavior During Therapy: Anxious Overall Cognitive Status: No family/caregiver present to determine baseline cognitive functioning Area of Impairment: Orientation                 Orientation Level: Disoriented to, Time   Memory: Decreased short-term memory Following Commands: Follows one step commands with increased time Safety/Judgement: Decreased awareness of safety, Decreased awareness of deficits   Problem Solving: Slow processing, Requires verbal cues, Requires tactile cues, Decreased initiation, Difficulty sequencing          General Comments General comments (skin integrity, edema, etc.): patient is anxious about calling his spouse on the phone. he first states that his goal is to get to rehab, and then on the phone he tells his family member that he wants to go home tomorrow using EMS.    Exercises     Assessment/Plan    PT  Assessment Patient needs continued PT services  PT Problem List Decreased strength;Decreased range of motion;Decreased activity tolerance;Decreased balance;Decreased mobility;Decreased cognition;Decreased safety awareness       PT Treatment Interventions DME instruction;Stair training;Gait training;Functional mobility training;Therapeutic activities;Therapeutic exercise;Balance training;Neuromuscular re-education;Cognitive remediation;Patient/family education    PT Goals (Current goals can be found in the Care Plan section)  Acute Rehab PT Goals Patient Stated Goal: to return home PT Goal Formulation: With patient Time For Goal Achievement: 12/19/21 Potential to Achieve Goals: Fair    Frequency Min 2X/week     Co-evaluation PT/OT/SLP Co-Evaluation/Treatment: Yes Reason for Co-Treatment: Complexity of the patient's impairments (multi-system involvement);For patient/therapist safety;To address functional/ADL transfers PT goals addressed during session: Mobility/safety with mobility         AM-PAC PT "6 Clicks" Mobility  Outcome Measure Help needed turning from your back to your side while in a flat bed without using bedrails?: A Lot Help needed moving from lying on your back to sitting on the side of a flat bed without using bedrails?: Total Help needed moving to and from a bed to a chair (including a wheelchair)?: Total Help needed standing up from a chair using your arms (e.g., wheelchair or bedside chair)?: Total Help needed to walk in hospital room?: Total Help needed climbing 3-5 steps with  a railing? : Total 6 Click Score: 7    End of Session   Activity Tolerance: Patient tolerated treatment well;Patient limited by fatigue Patient left: in bed;with call bell/phone within reach;with bed alarm set Nurse Communication: Mobility status (via secure chat) PT Visit Diagnosis: Unsteadiness on feet (R26.81);Muscle weakness (generalized) (M62.81);Difficulty in walking, not  elsewhere classified (R26.2)    Time: 3567-0141 PT Time Calculation (min) (ACUTE ONLY): 20 min   Charges:   PT Evaluation $PT Eval Low Complexity: 1 Low          [name], PT, MPT  [name] 12/05/2021, 2:28 PM

## 2021-12-05 NOTE — Assessment & Plan Note (Signed)
Stable Cr per record.  Plan Avoid nephrotoxic medications

## 2021-12-05 NOTE — Subjective & Objective (Signed)
Justin Griffith, an 81 y/o history of dementia, hypertension, hyperlipidemia, COPD, diabetes, PE on Eliquis who presents to the emergency department EMS after he had an unwitnessed fall at home.  Patient complaining of headache. He has h/o frequent falls. EMS exam revealed scalp laceration. He is transported to [facility] for evaluation.

## 2021-12-05 NOTE — TOC CM/SW Note (Signed)
TOC received VM from step daughter requesting call regarding if patient is eligible for ALF through his [employer] benefits, CSW requested benefits check. Step daughter is not listed in contacts, so attempted call to daughter [name] to discuss this and also discuss SNF rec from PT. Left VM for Belinda requesting a return call.  Oleh Genin, [place]

## 2021-12-05 NOTE — Assessment & Plan Note (Signed)
Last A1C 10/30/21 8.1%.  Plan Will continue basal insulin, increasing to 10u qHS  SS coverage

## 2021-12-05 NOTE — Assessment & Plan Note (Signed)
Per history on CPAP.  Plan Continue CPAP qHS - respiratory consult

## 2021-12-05 NOTE — ED Notes (Signed)
Patient laceration and head cleansed. Pt hands cleansed as well as they had dried blood on them. Patient now being transported to CT.

## 2021-12-05 NOTE — ED Provider Notes (Signed)
Called to see patient as he is unable to stand without wobbling and almost falling over without assistance.  Nurses had attempted to have him hold onto the back of the wheelchair and EMS knocked over the wheelchair.  This is the heavy hospital wheelchair and not the light usual wheelchair.  I came to see the patient and with 3 of his holding him up he was unable to take a step.  He is unsafe to go home at this time.   Nena Polio, MD 12/05/21 [phone]

## 2021-12-05 NOTE — Assessment & Plan Note (Signed)
Long term problem. Patient with peripheral neuropathy - contributing factor. Question of symptomatic spinal stenosis.  Plan PT/OT evaluation  TOC consult re: HH needs vs SNF

## 2021-12-06 ENCOUNTER — Encounter: Payer: Self-pay | Admitting: Internal Medicine

## 2021-12-06 DIAGNOSIS — I5032 Chronic diastolic (congestive) heart failure: Secondary | ICD-10-CM | POA: Diagnosis not present

## 2021-12-06 DIAGNOSIS — R262 Difficulty in walking, not elsewhere classified: Secondary | ICD-10-CM | POA: Diagnosis not present

## 2021-12-06 DIAGNOSIS — R296 Repeated falls: Secondary | ICD-10-CM | POA: Diagnosis not present

## 2021-12-06 LAB — PHOSPHORUS: Phosphorus: 3.1 mg/dL (ref 2.5–4.6)

## 2021-12-06 LAB — VITAMIN B12: Vitamin B-12: 1297 pg/mL — ABNORMAL HIGH (ref 180–914)

## 2021-12-06 LAB — BASIC METABOLIC PANEL
Anion gap: 5 (ref 5–15)
BUN: 12 mg/dL (ref 8–23)
CO2: 27 mmol/L (ref 22–32)
Calcium: 9.9 mg/dL (ref 8.9–10.3)
Chloride: 108 mmol/L (ref 98–111)
Creatinine, Ser: 1.16 mg/dL (ref 0.61–1.24)
GFR, Estimated: >60 mL/min (ref >60)
Glucose, Bld: 138 mg/dL — ABNORMAL HIGH (ref 70–99)
Potassium: 3.5 mmol/L (ref 3.5–5.1)
Sodium: 140 mmol/L (ref 135–145)

## 2021-12-06 LAB — MAGNESIUM: Magnesium: 1.7 mg/dL (ref 1.7–2.4)

## 2021-12-06 LAB — URINE CULTURE: Culture: NO GROWTH

## 2021-12-06 LAB — GLUCOSE, CAPILLARY
Glucose-Capillary: 119 mg/dL — ABNORMAL HIGH (ref 70–99)
Glucose-Capillary: 138 mg/dL — ABNORMAL HIGH (ref 70–99)
Glucose-Capillary: 137 mg/dL — ABNORMAL HIGH (ref 70–99)
Glucose-Capillary: 131 mg/dL — ABNORMAL HIGH (ref 70–99)

## 2021-12-06 NOTE — Progress Notes (Addendum)
Progress Note    Justin Griffith  JAS:[id number] DOB: 09/11/1940  DOA: 12/05/2021 PCP: Charlynne Cousins, MD (Inactive)      Brief Narrative:    Medical records reviewed and are as summarized below:  Justin Griffith is a 81 y.o. male with medical history significant for dementia, hypertension, hyperlipidemia, COPD, type 2 diabetes, chronic diastolic CHF, BPH with chronic urinary retention and indwelling Foley catheter in place, history of pulmonary embolism on Eliquis, history of recurrent falls, who presented to the hospital after a fall at home.  He lives with his wife at home who has dementia.  He complained of headache and he was found to have posterior right scalp laceration.      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  (Obesity)   S/p mechanical fall, recurrent falls at home, ambulatory dysfunction, posterior right scalp laceration s/p repair with staples on 12/05/2021: Analgesics as needed for pain.  PT recommended discharge to SNF.  Follow-up with social worker to assist with disposition.  Chronic diastolic CHF hypertension: Compensated.  Continue antihypertensives.  2D echo on 09/20/2021 showed EF estimated at 66 5%.  Type II DM: Continue Semglee and NovoLog as needed for hyperglycemia.  History of pulmonary embolism: Continue Eliquis  COPD: Compensated  BPH, chronic urinary retention, recently treated for UTI: Continue indwelling Foley catheter.  No urinary symptoms.  Discontinue trimethoprim.  Dementia: Continue  supportive care  Other comorbidities include CKD stage IIIb, OSA (refuses to use CPAP in the hospital), hyperlipidemia   Diet Order             Diet heart healthy/carb modified Room service appropriate? Yes; Fluid consistency: Thin  Diet effective now                            Consultants: None  Procedures: Posterior right scalp laceration repair with staples on 12/05/2021 in the ED    Medications:    apixaban  5 mg Oral BID   arformoterol  15 mcg Nebulization BID   And   umeclidinium bromide  1 puff Inhalation Daily   And   budesonide (PULMICORT) nebulizer solution  0.5 mg Nebulization BID   carvedilol  3.125 mg Oral BID WC   cloNIDine  0.1 mg Oral BID   cyanocobalamin  1,000 mcg Oral QHS   feeding supplement (GLUCERNA SHAKE)  237 mL Oral TID BM   hydrALAZINE  50 mg Oral TID   insulin aspart  0-20 Units Subcutaneous TID WC   insulin glargine-yfgn  10 Units Subcutaneous QHS   magnesium oxide  400 mg Oral Daily   pantoprazole  40 mg Oral Daily   rosuvastatin  10 mg Oral QHS   sodium chloride flush  3 mL Intravenous Q12H   Continuous Infusions:  sodium chloride       Anti-infectives (From admission, onward)    Start     Dose/Rate Route Frequency Ordered Stop   12/05/21 1230  trimethoprim (TRIMPEX) tablet 100 mg  Status:  Discontinued        100 mg Oral Daily 12/05/21 0842 12/06/21 1454   12/05/21 1000  clindamycin (CLEOCIN) capsule 300 mg  Status:  Discontinued        300 mg Oral 3 times daily 12/05/21 0827 12/05/21 0842              Family Communication/Anticipated D/C date and plan/Code Status   DVT prophylaxis:  apixaban (ELIQUIS) tablet 5 mg     Code Status: DNR  Family Communication: None Disposition Plan: Plan to discharge to SNF in 2 to 3 days   Status is: Inpatient Remains inpatient appropriate because: Unsafe discharge plan.  He needs SNF placement.       Subjective:   C/o generalized weakness  Objective:     Vitals:   12/06/21 0458 12/06/21 0829 12/06/21 0834 12/06/21 1046  BP: (!) 154/98 (!) 183/87  (!) 153/75  Pulse: 74 (!) 42  66  Resp: 16 18    Temp: 97.8 F (36.6 C) 97.8 F (36.6 C)    TempSrc: Oral Oral    SpO2: 94% 93% 95%   Weight:       No data found.   Intake/Output Summary (Last 24 hours) at 12/06/2021 1456 Last data filed at 12/06/2021 1030 Gross per 24 hour  Intake 530 ml  Output 770 ml  Net -240 ml   Filed Weights   12/05/21 0511  Weight: 106 kg    Exam:  GEN: NAD SKIN: Skin tear on right knee EYES: EOMI ENT: MMM CV: RRR PULM: CTA B ABD: soft, obese, NT, +BS CNS: AAO x 3, non focal EXT: No edema or tenderness    Pressure Injury Buttocks Bilateral Deep Tissue Pressure Injury - Purple or maroon localized area of discolored intact skin or blood-filled blister due to damage of underlying soft tissue from pressure and/or shear. (Active)     Location: Buttocks  Location Orientation: Bilateral  Staging: Deep Tissue Pressure Injury - Purple or maroon localized area of discolored intact skin or blood-filled blister due to damage of underlying soft tissue from pressure and/or shear.  Wound Description (Comments):   Present on Admission: Yes  Dressing Type Foam - Lift dressing to assess site every shift 11/24/21 0733     Data Reviewed:   I have personally reviewed following labs and imaging studies:  Labs: Labs show the following:   Basic Metabolic Panel: Recent Labs  Lab 11/29/21 2111 12/05/21 0616 12/06/21 0929  NA 138 141 140  K 3.9 3.4* 3.5  CL 109 108 108  CO2 '25 26 27  '$ GLUCOSE 151* 157* 138*  BUN '18 13 12  '$ CREATININE 1.20 1.14 1.16  CALCIUM 10.0 10.6* 9.9  MG  --   --  1.7  PHOS  --   --  3.1   GFR Estimated Creatinine Clearance: 61.9 mL/min (by C-G formula based on SCr of 1.16 mg/dL). Liver Function Tests: Recent Labs  Lab 12/05/21 0616  AST 23  ALT 24  ALKPHOS 57  BILITOT 0.5  PROT 7.2  ALBUMIN 3.2*   No results for  input(s): "LIPASE", "AMYLASE" in the last 168 hours. No results for input(s): "AMMONIA" in the last 168 hours. Coagulation profile No results for input(s): "INR", "PROTIME" in the last 168 hours.  CBC: Recent Labs  Lab 11/29/21 2111 12/05/21 0616  WBC 6.3 7.3  NEUTROABS 4.0 5.1  HGB 11.3* 11.9*  HCT 34.9* 36.8*  MCV 89.9 90.4  PLT 332 315  Cardiac Enzymes: No results for input(s): "CKTOTAL", "CKMB", "CKMBINDEX", "TROPONINI" in the last 168 hours. BNP (last 3 results) No results for input(s): "PROBNP" in the last 8760 hours. CBG: Recent Labs  Lab 12/05/21 1209 12/05/21 1734 12/05/21 2115 12/06/21 0831 12/06/21 1113  GLUCAP 134* 150* 126* 119* 138*   D-Dimer: No results for input(s): "DDIMER" in the last 72 hours. Hgb A1c: No results for input(s): "HGBA1C" in the last 72 hours. Lipid Profile: No results for input(s): "CHOL", "HDL", "LDLCALC", "TRIG", "CHOLHDL", "LDLDIRECT" in the last 72 hours. Thyroid function studies: No results for input(s): "TSH", "T4TOTAL", "T3FREE", "THYROIDAB" in the last 72 hours.  Invalid input(s): "FREET3" Anemia work up: No results for input(s): "VITAMINB12", "FOLATE", "FERRITIN", "TIBC", "IRON", "RETICCTPCT" in the last 72 hours. Sepsis Labs: Recent Labs  Lab 11/29/21 2111 12/05/21 0616  WBC 6.3 7.3  LATICACIDVEN 1.1  --     Microbiology Recent Results (from the past 240 hour(s))  Urine Culture     Status: None   Collection Time: 12/05/21  6:06 AM   Specimen: Urine, Random  Result Value Ref Range Status   Specimen Description   Final    URINE, RANDOM Performed at [facility], [address] [place], [place] [address]    Special Requests   Final    NONE Performed at [facility], [address], [place], [place] [address]    Culture   Final    NO GROWTH Performed at [facility], [place] [address] [place], [place] [address]    Report Status 12/06/2021 FINAL  Final    Procedures and diagnostic  studies:  CT Cervical Spine Wo Contrast  Result Date: 12/05/2021 CLINICAL DATA:  81 year old male status post unwitnessed fall at home. Posterior laceration. EXAM: CT CERVICAL SPINE WITHOUT CONTRAST TECHNIQUE: Multidetector CT imaging of the cervical spine was performed without intravenous contrast. Multiplanar CT image reconstructions were also generated. RADIATION DOSE REDUCTION: This exam was performed according to the departmental dose-optimization program which includes automated exposure control, adjustment of the mA and/or kV according to patient size and/or use of iterative reconstruction technique. COMPARISON:  CT head today.  Cervical spine CT 10/30/2021. FINDINGS: Alignment: Chronic straightening of cervical lordosis. Cervicothoracic junction alignment is within normal limits. Bilateral posterior element alignment is within normal limits. Skull base and vertebrae: Visualized skull base is intact. No atlanto-occipital dissociation. C1 and C2 appear stable and intact. Chronic anterior odontoid subchondral cyst. No acute osseous abnormality identified. Soft tissues and spinal canal: No prevertebral fluid or swelling. No visible canal hematoma. Stable noncontrast neck soft tissues including bulky calcified carotid atherosclerosis. Disc levels: Chronic severe cervical spine degeneration. Bulky facet degeneration, disc and endplate degeneration. Multilevel cervical spinal stenosis appears stable and maximal at C4-C5 and C5-C6. Upper chest: Visible upper thoracic levels appear intact. Negative lung apices and noncontrast thoracic inlet. Other: Chronically absent dentition. Visible facial bones appear intact. IMPRESSION: 1. No acute traumatic injury identified in the cervical spine. 2. Chronic severe cervical spine degeneration. Stable CT appearance of multilevel cervical spinal stenosis, maximal at C4-C5 and C5-C6. Electronically Signed   By: Genevie Ann M.D.   On: 12/05/2021 06:18   CT HEAD WO CONTRAST  (5MM)  Result Date: 12/05/2021 CLINICAL DATA:  81 year old male status post unwitnessed fall at home. Posterior laceration. EXAM: CT HEAD WITHOUT CONTRAST TECHNIQUE: Contiguous axial images were obtained from the base of the skull through the vertex without intravenous contrast. RADIATION DOSE REDUCTION: This exam was performed according to the  departmental dose-optimization program which includes automated exposure control, adjustment of the mA and/or kV according to patient size and/or use of iterative reconstruction technique. COMPARISON:  Head CT 11/17/2021. FINDINGS: Brain: Stable cerebral volume. Chronic infarct in the left cerebellum PICA territory, confluent bilateral cerebral white matter hypodensity appears stable since last month. Deep gray matter nuclei appear stable with asymmetric heterogeneous hypodensity in the left thalamus, and the right caudothalamic groove. No midline shift, ventriculomegaly, mass effect, evidence of mass lesion, intracranial hemorrhage or evidence of cortically based acute infarction. Vascular: Calcified atherosclerosis at the skull base. Skull: No skull fracture identified. Sinuses/Orbits: Chronic mastoid sclerosis is stable. Tympanic cavities remain clear. Visualized paranasal sinuses are stable and well aerated. Other: Broad-based posterior convexity scalp hematoma on the right. Skin staples and small volume soft tissue gas there. Underlying calvarium appears stable and intact. Other orbit and scalp soft tissues appears stable. IMPRESSION: 1. Large right posterior convexity scalp hematoma/laceration. No underlying skull fracture. 2. No acute intracranial abnormality. 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 Electronically Signed   By: Genevie Ann M.D.   On: 12/05/2021 06:14               LOS: 1 day   Octavia Velador  [facility]   Pager on [email or web address]. If 7PM-7AM, please contact  night-coverage at [email or web address]     12/06/2021, 2:56 PM

## 2021-12-06 NOTE — Progress Notes (Signed)
End of shift note: 0700-1900  There were not any significant events during this shift. VSS. Foley care was provided. Family was updated. Schedule medications were given. BG was checked and coverage was given.

## 2021-12-06 NOTE — Plan of Care (Signed)
?  Problem: Education: ?Goal: Knowledge of General Education information will improve ?Description: Including pain rating scale, medication(s)/side effects and non-pharmacologic comfort measures ?Outcome: Progressing ?  ?Problem: Activity: ?Goal: Risk for activity intolerance will decrease ?Outcome: Progressing ?  ?Problem: Nutrition: ?Goal: Adequate nutrition will be maintained ?Outcome: Progressing ?  ?Problem: Coping: ?Goal: Level of anxiety will decrease ?Outcome: Progressing ?  ?Problem: Elimination: ?Goal: Will not experience complications related to urinary retention ?Outcome: Progressing ?  ?Problem: Pain Managment: ?Goal: General experience of comfort will improve ?Outcome: Progressing ?  ?Problem: Safety: ?Goal: Ability to remain free from injury will improve ?Outcome: Progressing ?  ?

## 2021-12-06 NOTE — Plan of Care (Signed)
  Problem: Fluid Volume: Goal: Hemodynamic stability will improve Outcome: Progressing   

## 2021-12-06 NOTE — TOC Progression Note (Addendum)
Transition of Care [facility]) - Progression Note    Patient Details  Name: DONYAE KILNER MRN: [id number] Date of Birth: February 27, 1941  Transition of Care [facility]) CM/SW [place], [place] Phone Number: 12/06/2021, 9:40 AM  Clinical Narrative:    Update 10:08 am-CSW reached out to [facility], pt was there two to three weeks ago, now in copay status. Pt's daughter states she paid for two weeks but pt left and there is a credit owed. Magda Paganini states she can extend bed offer pending clearance from the business office on Monday.   CSW spoke with pt's daughter [name], provided only current bed offer of [facility], she states her father doesn't want to go there, that he went there previously and only stayed four hours. Other offers still pending.    Expected Discharge Plan: [place] Barriers to Discharge: Continued Medical Work up  Expected Discharge Plan and Services Expected Discharge Plan: [place] arrangements for the past 2 months: Single Family Home                                       Social Determinants of Health (SDOH) Interventions    Readmission Risk Interventions    11/24/2021   10:33 AM 09/21/2021   10:42 AM 05/21/2020   10:01 AM  Readmission Risk Prevention Plan  Transportation Screening Complete Complete Complete  PCP or Specialist Appt within 3-5 Days  Complete Complete  HRI or Home Care Consult  Complete Complete  Social Work Consult for [place] Planning/Counseling  Complete Complete  Palliative Care Screening  Not Applicable Not Applicable  Medication Review Press photographer) Complete Complete Complete  PCP or Specialist appointment within 3-5 days of discharge Complete    HRI or Home Care Consult Complete    SW Recovery Care/Counseling Consult Complete    Palliative Care Screening Not [place] Complete

## 2021-12-07 DIAGNOSIS — R413 Other amnesia: Secondary | ICD-10-CM

## 2021-12-07 DIAGNOSIS — R262 Difficulty in walking, not elsewhere classified: Secondary | ICD-10-CM | POA: Diagnosis not present

## 2021-12-07 DIAGNOSIS — I5032 Chronic diastolic (congestive) heart failure: Secondary | ICD-10-CM | POA: Diagnosis not present

## 2021-12-07 DIAGNOSIS — R296 Repeated falls: Secondary | ICD-10-CM | POA: Diagnosis not present

## 2021-12-07 LAB — GLUCOSE, CAPILLARY
Glucose-Capillary: 156 mg/dL — ABNORMAL HIGH (ref 70–99)
Glucose-Capillary: 134 mg/dL — ABNORMAL HIGH (ref 70–99)
Glucose-Capillary: 135 mg/dL — ABNORMAL HIGH (ref 70–99)
Glucose-Capillary: 159 mg/dL — ABNORMAL HIGH (ref 70–99)

## 2021-12-07 MED ORDER — CHLORHEXIDINE GLUCONATE CLOTH 2 % EX PADS
6.0000 | MEDICATED_PAD | Freq: Every day | CUTANEOUS | Status: DC
  Administered 2021-12-07 – 2021-12-10 (×4): 6 via TOPICAL

## 2021-12-07 NOTE — Progress Notes (Signed)
Progress Note    Justin Griffith  UDJ:[id number] DOB: Jun 03, 1940  DOA: 12/05/2021 PCP: Charlynne Cousins, MD (Inactive)      Brief Narrative:    Medical records reviewed and are as summarized below:  Justin Griffith is a 81 y.o. male with medical history significant for dementia, hypertension, hyperlipidemia, COPD, type 2 diabetes, chronic diastolic CHF, BPH with chronic urinary retention and indwelling Foley catheter in place, history of pulmonary embolism on Eliquis, history of recurrent falls, who presented to the hospital after a fall at home.  He lives with his wife at home who has dementia.  He complained of headache and he was found to have posterior right scalp laceration.      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  (Obesity)   S/p mechanical fall, recurrent falls at home, ambulatory dysfunction, posterior right scalp laceration s/p repair with staples on 12/05/2021: PT and OT recommended discharge to SNF.  Awaiting placement.  Chronic diastolic CHF hypertension: Compensated.  Continue antihypertensives.  2D echo on 09/20/2021 showed EF estimated at 66 5%.  Type II DM: Glucose levels are okay.  Continue insulin glargine and NovoLog as needed for hyperglycemia.    History of pulmonary embolism: Continue Eliquis  COPD: Compensated  BPH, chronic urinary retention, recently treated for UTI: Continue indwelling Foley catheter.  No urinary symptoms.  Trimethoprim was discontinued on 12/06/2021  Dementia: Continue supportive  care  Other comorbidities include CKD stage IIIb, OSA (refuses to use CPAP in the hospital), hyperlipidemia   Diet Order             Diet heart healthy/carb modified Room service appropriate? Yes; Fluid consistency: Thin  Diet effective now                            Consultants: None  Procedures: Posterior right scalp laceration repair with staples on 12/05/2021 in the ED    Medications:    apixaban  5 mg Oral BID   arformoterol  15 mcg Nebulization BID   And   umeclidinium bromide  1 puff Inhalation Daily   And   budesonide (PULMICORT) nebulizer solution  0.5 mg Nebulization BID   carvedilol  3.125 mg Oral BID WC   cloNIDine  0.1 mg Oral BID   cyanocobalamin  1,000 mcg Oral QHS   feeding supplement (GLUCERNA SHAKE)  237 mL Oral TID BM   hydrALAZINE  50 mg Oral TID   insulin aspart  0-20 Units Subcutaneous TID WC   insulin glargine-yfgn  10 Units Subcutaneous QHS   magnesium oxide  400 mg Oral Daily   pantoprazole  40 mg Oral Daily   rosuvastatin  10 mg Oral QHS   sodium chloride flush  3 mL Intravenous Q12H   Continuous Infusions:  sodium chloride       Anti-infectives (From admission, onward)    Start     Dose/Rate Route Frequency Ordered Stop   12/05/21 1230  trimethoprim (TRIMPEX) tablet 100  mg  Status:  Discontinued        100 mg Oral Daily 12/05/21 0842 12/06/21 1454   12/05/21 1000  clindamycin (CLEOCIN) capsule 300 mg  Status:  Discontinued        300 mg Oral 3 times daily 12/05/21 0827 12/05/21 0842              Family Communication/Anticipated D/C date and plan/Code Status   DVT prophylaxis:  apixaban (ELIQUIS) tablet 5 mg     Code Status: DNR  Family Communication: None Disposition Plan: Plan to discharge to SNF in 2 to 3 days   Status is: Inpatient Remains inpatient appropriate because: Unsafe discharge plan.  He needs SNF placement.       Subjective:   He complains of generalized weakness.  He inquired  about updates regarding nursing home placement.  Objective:    Vitals:   12/07/21 0437 12/07/21 0519 12/07/21 0730 12/07/21 0748  BP: (!) 185/90 (!) 164/85 (!) 161/77   Pulse: 70  75   Resp: 20  18   Temp: 98.4 F (36.9 C)  98.2 F (36.8 C)   TempSrc: Oral  Oral   SpO2: 95%  95% 95%  Weight:       No data found.   Intake/Output Summary (Last 24 hours) at 12/07/2021 1349 Last data filed at 12/07/2021 0500 Gross per 24 hour  Intake 237 ml  Output 500 ml  Net -263 ml   Filed Weights   12/05/21 0511  Weight: 106 kg    Exam:  GEN: NAD SKIN: Warm and dry.  Staples on the right posterior parieto-occipital area EYES: No pallor or icterus ENT: MMM CV: RRR PULM: CTA B ABD: soft, ND, NT, +BS CNS: AAO x 3, non focal EXT: No edema or tenderness     Pressure Injury Buttocks Bilateral Deep Tissue Pressure Injury - Purple or maroon localized area of discolored intact skin or blood-filled blister due to damage of underlying soft tissue from pressure and/or shear. (Active)     Location: Buttocks  Location Orientation: Bilateral  Staging: Deep Tissue Pressure Injury - Purple or maroon localized area of discolored intact skin or blood-filled blister due to damage of underlying soft tissue from pressure and/or shear.  Wound Description (Comments):   Present on Admission: Yes  Dressing Type Foam - Lift dressing to assess site every shift 12/07/21 0818     Data Reviewed:   I have personally reviewed following labs and imaging studies:  Labs: Labs show the following:   Basic Metabolic Panel: Recent Labs  Lab 12/05/21 0616 12/06/21 0929  NA 141 140  K 3.4* 3.5  CL 108 108  CO2 26 27  GLUCOSE 157* 138*  BUN 13 12  CREATININE 1.14 1.16  CALCIUM 10.6* 9.9  MG  --  1.7  PHOS  --  3.1   GFR Estimated Creatinine Clearance: 61.9 mL/min (by C-G formula based on SCr of 1.16 mg/dL). Liver Function Tests: Recent Labs  Lab 12/05/21 0616  AST 23  ALT 24  ALKPHOS 57   BILITOT 0.5  PROT 7.2  ALBUMIN 3.2*   No results for input(s): "LIPASE", "AMYLASE" in the last 168 hours. No results for input(s): "AMMONIA" in the last 168 hours. Coagulation profile No results for input(s): "INR", "PROTIME" in the last 168 hours.  CBC: Recent Labs  Lab 12/05/21 0616  WBC 7.3  NEUTROABS 5.1  HGB 11.9*  HCT 36.8*  MCV 90.4  PLT 315   Cardiac Enzymes:  No results for input(s): "CKTOTAL", "CKMB", "CKMBINDEX", "TROPONINI" in the last 168 hours. BNP (last 3 results) No results for input(s): "PROBNP" in the last 8760 hours. CBG: Recent Labs  Lab 12/06/21 1113 12/06/21 1550 12/06/21 2110 12/07/21 0746 12/07/21 1116  GLUCAP 138* 137* 131* 156* 134*   D-Dimer: No results for input(s): "DDIMER" in the last 72 hours. Hgb A1c: No results for input(s): "HGBA1C" in the last 72 hours. Lipid Profile: No results for input(s): "CHOL", "HDL", "LDLCALC", "TRIG", "CHOLHDL", "LDLDIRECT" in the last 72 hours. Thyroid function studies: No results for input(s): "TSH", "T4TOTAL", "T3FREE", "THYROIDAB" in the last 72 hours.  Invalid input(s): "FREET3" Anemia work up: Recent Labs    12/06/21 [place] 1,297*   Sepsis Labs: Recent Labs  Lab 12/05/21 0616  WBC 7.3    Microbiology Recent Results (from the past 240 hour(s))  Urine Culture     Status: None   Collection Time: 12/05/21  6:06 AM   Specimen: Urine, Random  Result Value Ref Range Status   Specimen Description   Final    URINE, RANDOM Performed at [facility], [address], [place], [place] [address]    Special Requests   Final    NONE Performed at [facility], [address], [place], [place] [address]    Culture   Final    NO GROWTH Performed at [facility], [place] [address] [place], [place] [address]    Report Status 12/06/2021 FINAL  Final    Procedures and diagnostic studies:  No results found.             LOS: 2 days   Carmell Elgin  [facility]   Pager on [email or web address]. If 7PM-7AM, please contact night-coverage at [email or web address]     12/07/2021, 1:49 PM

## 2021-12-08 ENCOUNTER — Telehealth: Payer: Self-pay | Admitting: *Deleted

## 2021-12-08 DIAGNOSIS — R262 Difficulty in walking, not elsewhere classified: Secondary | ICD-10-CM | POA: Diagnosis not present

## 2021-12-08 DIAGNOSIS — I5032 Chronic diastolic (congestive) heart failure: Secondary | ICD-10-CM | POA: Diagnosis not present

## 2021-12-08 DIAGNOSIS — R296 Repeated falls: Secondary | ICD-10-CM | POA: Diagnosis not present

## 2021-12-08 LAB — GLUCOSE, CAPILLARY
Glucose-Capillary: 146 mg/dL — ABNORMAL HIGH (ref 70–99)
Glucose-Capillary: 158 mg/dL — ABNORMAL HIGH (ref 70–99)
Glucose-Capillary: 114 mg/dL — ABNORMAL HIGH (ref 70–99)
Glucose-Capillary: 149 mg/dL — ABNORMAL HIGH (ref 70–99)

## 2021-12-08 NOTE — TOC Progression Note (Signed)
Transition of Care [facility]) - Progression Note    Patient Details  Name: Justin Griffith MRN: [id number] Date of Birth: 29-Jun-1940  [facility] CM/SW Contact  Justin Sessions, RN Phone Number: 12/08/2021, 11:21 AM  Clinical Narrative:     Justin Griffith with Justin Griffith at [facility], they are unable to offer a bed  Updated daughter Justin Griffith.  [facility] bed [facility].  She discussed with patient and they would like to accept Justin Griffith  Accepted in [place], notified Justin Griffith at Elizabeth Sauer MD to determine when patient will medically ready for discharge, and requested updated therapy note from PT in order to obtain auth  Expected Discharge Plan: [place] Barriers to Discharge: Continued Medical Work up  Expected Discharge Plan and Services Expected Discharge Plan: Justin Griffith arrangements for the past 2 months: Single Family Home                                       Social Determinants of Health (SDOH) Interventions    Readmission Risk Interventions    11/24/2021   10:33 AM 09/21/2021   10:42 AM 05/21/2020   10:01 AM  Readmission Risk Prevention Plan  Transportation Screening Complete Complete Complete  PCP or Specialist Appt within 3-5 Days  Complete Complete  HRI or Home Care Consult  Complete Complete  Social Work Consult for [place] Planning/Counseling  Complete Complete  Palliative Care Screening  Not Applicable Not Applicable  Medication Review Press photographer) Complete Complete Complete  PCP or Specialist appointment within 3-5 days of discharge Complete    HRI or Home Care Consult Complete    SW Recovery Care/Counseling Consult Complete    Palliative Care Screening Not [place] Complete

## 2021-12-08 NOTE — Telephone Encounter (Signed)
Transition Care Management Unsuccessful Follow-up Telephone Call  Date of discharge and from where:  [facility] 12-05-2021   Attempts:  1st Attempt  Reason for unsuccessful TCM follow-up call:  No answer/busy

## 2021-12-08 NOTE — Progress Notes (Signed)
Progress Note    Justin Griffith  TIW:[id number] DOB: [birth year]  DOA: 12/05/2021 PCP: Charlynne Cousins, MD (Inactive)      Brief Narrative:    Medical records reviewed and are as summarized below:  Justin Griffith is a 81 y.o. male with medical history significant for dementia, hypertension, hyperlipidemia, COPD, type 2 diabetes, chronic diastolic CHF, BPH with chronic urinary retention and indwelling Foley catheter in place, history of pulmonary embolism on Eliquis, history of recurrent falls, who presented to the hospital after a fall at home.  He lives with his wife at home who has dementia.  He complained of headache and he was found to have posterior right scalp laceration.      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  (Obesity)   S/p mechanical fall, recurrent falls at home, ambulatory dysfunction, posterior right scalp laceration s/p repair with staples on 12/05/2021: PT and OT recommended discharge to SNF.  Awaiting insurance authorization for SNF  Chronic diastolic CHF hypertension: Compensated.  Continue antihypertensives.  2D echo on 09/20/2021 showed EF estimated at 66 5%.  Type II DM: Glucose levels are okay.  Continue insulin glargine and NovoLog as needed for hyperglycemia.    History of pulmonary embolism: Continue Eliquis  COPD: Compensated  BPH, chronic urinary retention, recently treated for UTI: Continue indwelling Foley catheter.  No urinary symptoms.  Trimethoprim was discontinued on 12/06/2021  Dementia:  Continue supportive care  Other comorbidities include CKD stage IIIb, OSA (refuses to use CPAP in the hospital), hyperlipidemia   Diet Order             Diet heart healthy/carb modified Room service appropriate? Yes; Fluid consistency: Thin  Diet effective now                            Consultants: None  Procedures: Posterior right scalp laceration repair with staples on 12/05/2021 in the ED    Medications:    apixaban  5 mg Oral BID   arformoterol  15 mcg Nebulization BID   And   umeclidinium bromide  1 puff Inhalation Daily   And   budesonide (PULMICORT) nebulizer solution  0.5 mg Nebulization BID   carvedilol  3.125 mg Oral BID WC   Chlorhexidine Gluconate Cloth  6 each Topical Daily   cloNIDine  0.1 mg Oral BID   cyanocobalamin  1,000 mcg Oral QHS   feeding supplement (GLUCERNA SHAKE)  237 mL Oral TID BM   hydrALAZINE  50 mg Oral TID   insulin aspart  0-20 Units Subcutaneous TID WC   insulin glargine-yfgn  10 Units Subcutaneous QHS   magnesium oxide  400 mg Oral Daily   pantoprazole  40 mg Oral Daily   rosuvastatin  10 mg Oral QHS   sodium chloride flush  3 mL Intravenous Q12H   Continuous Infusions:  sodium chloride       Anti-infectives (From admission, onward)    Start     Dose/Rate  Route Frequency Ordered Stop   12/05/21 1230  trimethoprim (TRIMPEX) tablet 100 mg  Status:  Discontinued        100 mg Oral Daily 12/05/21 0842 12/06/21 1454   12/05/21 1000  clindamycin (CLEOCIN) capsule 300 mg  Status:  Discontinued        300 mg Oral 3 times daily 12/05/21 0827 12/05/21 0842              Family Communication/Anticipated D/C date and plan/Code Status   DVT prophylaxis:  apixaban (ELIQUIS) tablet 5 mg     Code Status: DNR  Family Communication: Plan discussed with his wife and stepdaughter at the bedside Disposition Plan: Plan to discharge to SNF in 1 to 2 days   Status is: Inpatient Remains inpatient appropriate because:  Unsafe discharge plan.  He needs SNF placement.       Subjective:   He inquired about updates regarding rehab.  He is eager to go to rehab.  No other complaints.  His wife and stepdaughter were at the bedside.  His nurse was also at the bedside.  Objective:    Vitals:   12/07/21 2346 12/08/21 0449 12/08/21 0750 12/08/21 0800  BP:  (!) 164/76  (!) 171/90  Pulse:  64  68  Resp:  20  19  Temp:  98 F (36.7 C)  97.8 F (36.6 C)  TempSrc:  Oral  Axillary  SpO2:  93% 95% 100%  Weight: 103.1 kg     Height: '5\' 11"'$  (1.803 m)      No data found.   Intake/Output Summary (Last 24 hours) at 12/08/2021 1512 Last data filed at 12/08/2021 1421 Gross per 24 hour  Intake 480 ml  Output 600 ml  Net -120 ml   Filed Weights   12/05/21 0511 12/07/21 2346  Weight: 106 kg 103.1 kg    Exam:  GEN: NAD SKIN: Warm and dry. Bruise/swelling with stapled laceration on right posterior parieto-occipital area EYES: No pallor or icterus ENT: MMM CV: RRR PULM: CTA B ABD: soft, obese, NT, +BS CNS: AAO x 3, non focal EXT: No edema or tenderness GU: Continue indwelling Foley catheter     Data Reviewed:   I have personally reviewed following labs and imaging studies:  Labs: Labs show the following:   Basic Metabolic Panel: Recent Labs  Lab 12/05/21 0616 12/06/21 0929  NA 141 140  K 3.4* 3.5  CL 108 108  CO2 26 27  GLUCOSE 157* 138*  BUN 13 12  CREATININE 1.14 1.16  CALCIUM 10.6* 9.9  MG  --  1.7  PHOS  --  3.1   GFR Estimated Creatinine Clearance: 61 mL/min (by C-G formula based on SCr of 1.16 mg/dL). Liver Function Tests: Recent Labs  Lab 12/05/21 0616  AST 23  ALT 24  ALKPHOS 57  BILITOT 0.5  PROT 7.2  ALBUMIN 3.2*   No results for input(s): "LIPASE", "AMYLASE" in the last 168 hours. No results for input(s): "AMMONIA" in the last 168 hours. Coagulation profile No results for input(s): "INR", "PROTIME" in the last 168 hours.  CBC: Recent Labs  Lab  12/05/21 0616  WBC 7.3  NEUTROABS 5.1  HGB 11.9*  HCT 36.8*  MCV 90.4  PLT 315   Cardiac Enzymes: No results for input(s): "CKTOTAL", "CKMB", "CKMBINDEX", "TROPONINI" in the last 168 hours. BNP (last 3 results) No results for input(s): "PROBNP" in the last 8760 hours. CBG: Recent Labs  Lab 12/07/21 1116 12/07/21 1547 12/07/21 2025 12/08/21  7001 12/08/21 1236  GLUCAP 134* 135* 159* 146* 158*   D-Dimer: No results for input(s): "DDIMER" in the last 72 hours. Hgb A1c: No results for input(s): "HGBA1C" in the last 72 hours. Lipid Profile: No results for input(s): "CHOL", "HDL", "LDLCALC", "TRIG", "CHOLHDL", "LDLDIRECT" in the last 72 hours. Thyroid function studies: No results for input(s): "TSH", "T4TOTAL", "T3FREE", "THYROIDAB" in the last 72 hours.  Invalid input(s): "FREET3" Anemia work up: Recent Labs    12/06/21 [place] 1,297*   Sepsis Labs: Recent Labs  Lab 12/05/21 0616  WBC 7.3    Microbiology Recent Results (from the past 240 hour(s))  Urine Culture     Status: None   Collection Time: 12/05/21  6:06 AM   Specimen: Urine, Random  Result Value Ref Range Status   Specimen Description   Final    URINE, RANDOM Performed at [facility], [address], [place], [place] [address]    Special Requests   Final    NONE Performed at [facility], [address] [place], [place] [address]    Culture   Final    NO GROWTH Performed at [facility], [place] [address] [place],  [address]    Report Status 12/06/2021 FINAL  Final    Procedures and diagnostic studies:  No results found.             LOS: 3 days   Anahli Arvanitis  [facility]   Pager on [email or web address]. If 7PM-7AM, please contact night-coverage at [email or web address]     12/08/2021, 3:12 PM

## 2021-12-08 NOTE — Care Management Important Message (Signed)
Important Message  Patient Details  Name: Justin Griffith MRN: [id number] Date of Birth: [birth year]   Medicare Important Message Given:  Yes     Dannette Barbara 12/08/2021, 11:56 AM

## 2021-12-08 NOTE — Progress Notes (Signed)
Physical Therapy Treatment Patient Details Name: Justin Griffith MRN: [id number] DOB: 11-Sep-1940 Today's Date: 12/08/2021   History of Present Illness Patient is a 81 year old male with  history of dementia, hypertension, hyperlipidemia, COPD, diabetes, PE on Eliquis who presents to the emergency department EMS after he had an unwitnessed fall at home. CT of head Large right posterior convexity scalp hematoma/laceration no underlying skull fracture    PT Comments    Patient is making progress with functional independence. He still continues to require physical assistance with bed mobility and transfers. Max A needed to stand from bed with facilitation for anterior weight shifting provided. Unable to progress to ambulation due to generalized weakness and limited standing tolerance. The patient will need SNF placement at discharge to maximize independence and facilitate return to prior level of function. PT will continue to follow while in the hospital.    Recommendations for follow up therapy are one component of a multi-disciplinary discharge planning process, led by the attending physician.  Recommendations may be updated based on patient status, additional functional criteria and insurance authorization.  Follow Up Recommendations  Skilled nursing-short term rehab (<3 hours/day) Can patient physically be transported by private vehicle: No   Assistance Recommended at Discharge Frequent or constant Supervision/Assistance  Patient can return home with the following Two people to help with walking and/or transfers;A lot of help with bathing/dressing/bathroom;Help with stairs or ramp for entrance;Assist for transportation;Assistance with cooking/housework;Direct supervision/assist for medications management   Equipment Recommendations  None recommended by PT    Recommendations for Other Services       Precautions / Restrictions Precautions Precautions: Fall Restrictions Weight Bearing  Restrictions: No     Mobility  Bed Mobility Overal bed mobility: Needs Assistance Bed Mobility: Supine to Sit, Sit to Supine     Supine to sit: Max assist Sit to supine: Max assist   General bed mobility comments: verbal cues for sequencing. increased time and effort required. assistance for trunk and BLE support    Transfers Overall transfer level: Needs assistance Equipment used: Rolling walker (2 wheels) Transfers: Sit to/from Stand Sit to Stand: Max assist           General transfer comment: verbal cues for hand placement and technique to stand. lifting and lowering assistance needed as well as faciliation for anterior weight shifting    Ambulation/Gait               General Gait Details: unable to due to poor standing tolerance and generalized weakness   Stairs             Wheelchair Mobility    Modified Rankin (Stroke Patients Only)       Balance Overall balance assessment: Needs assistance Sitting-balance support: Feet supported Sitting balance-Leahy Scale: Fair     Standing balance support: Bilateral upper extremity supported Standing balance-Leahy Scale: Poor Standing balance comment: occasional posterior lean with RW for UE support                            Cognition Arousal/Alertness: Awake/alert Behavior During Therapy: WFL for tasks assessed/performed Overall Cognitive Status: Within Functional Limits for tasks assessed                                 General Comments: patient able to follow single step commands consistently and he was cooperative with assessment  Exercises      General Comments General comments (skin integrity, edema, etc.): patient declined getting up to chair. he was set-up with lunch tray at end of session      Pertinent Vitals/Pain Pain Assessment Pain Assessment: No/denies pain    Home Living                          Prior Function            PT  Goals (current goals can now be found in the care plan section) Acute Rehab PT Goals Patient Stated Goal: to return home PT Goal Formulation: With patient Time For Goal Achievement: 12/19/21 Potential to Achieve Goals: Fair Progress towards PT goals: Progressing toward goals    Frequency    Min 2X/week      PT Plan Current plan remains appropriate    Co-evaluation              AM-PAC PT "6 Clicks" Mobility   Outcome Measure  Help needed turning from your back to your side while in a flat bed without using bedrails?: A Lot Help needed moving from lying on your back to sitting on the side of a flat bed without using bedrails?: A Lot Help needed moving to and from a bed to a chair (including a wheelchair)?: A Lot Help needed standing up from a chair using your arms (e.g., wheelchair or bedside chair)?: A Lot Help needed to walk in hospital room?: Total Help needed climbing 3-5 steps with a railing? : Total 6 Click Score: 10    End of Session   Activity Tolerance: Patient tolerated treatment well Patient left: in bed;with call bell/phone within reach;with bed alarm set   PT Visit Diagnosis: Unsteadiness on feet (R26.81);Muscle weakness (generalized) (M62.81);Difficulty in walking, not elsewhere classified (R26.2)     Time: 5956-3875 PT Time Calculation (min) (ACUTE ONLY): 9 min  Charges:  $Therapeutic Activity: 8-22 mins                    [name], PT, MPT    [name] 12/08/2021, 12:08 PM

## 2021-12-08 NOTE — TOC Progression Note (Signed)
Transition of Care [facility]) - Progression Note    Patient Details  Name: Justin Griffith MRN: [id number] Date of Birth: 1941-03-21  [facility] CM/SW Contact  Beverly Sessions, RN Phone Number: 12/08/2021, 11:47 AM  Clinical Narrative:     Per MD medically stable.  Insurance auth started with Tammy at [facility]. She is aware that PT to see patient today and enter an updated note  Also requested EMS transport [facility]   Expected Discharge Plan: [place] Barriers to Discharge: Continued Medical Work up  Expected Discharge Plan and Services Expected Discharge Plan: [place] arrangements for the past 2 months: Single Family Home                                       Social Determinants of Health (SDOH) Interventions    Readmission Risk Interventions    11/24/2021   10:33 AM 09/21/2021   10:42 AM 05/21/2020   10:01 AM  Readmission Risk Prevention Plan  Transportation Screening Complete Complete Complete  PCP or Specialist Appt within 3-5 Days  Complete Complete  HRI or Home Care Consult  Complete Complete  Social Work Consult for [place] Planning/Counseling  Complete Complete  Palliative Care Screening  Not Applicable Not Applicable  Medication Review Press photographer) Complete Complete Complete  PCP or Specialist appointment within 3-5 days of discharge Complete    HRI or Home Care Consult Complete    SW Recovery Care/Counseling Consult Complete    Palliative Care Screening Not [place] Complete

## 2021-12-09 DIAGNOSIS — I10 Essential (primary) hypertension: Secondary | ICD-10-CM

## 2021-12-09 DIAGNOSIS — I5032 Chronic diastolic (congestive) heart failure: Secondary | ICD-10-CM | POA: Diagnosis not present

## 2021-12-09 DIAGNOSIS — R262 Difficulty in walking, not elsewhere classified: Secondary | ICD-10-CM | POA: Diagnosis not present

## 2021-12-09 DIAGNOSIS — N1832 Chronic kidney disease, stage 3b: Secondary | ICD-10-CM

## 2021-12-09 DIAGNOSIS — R413 Other amnesia: Secondary | ICD-10-CM | POA: Diagnosis not present

## 2021-12-09 LAB — GLUCOSE, CAPILLARY
Glucose-Capillary: 147 mg/dL — ABNORMAL HIGH (ref 70–99)
Glucose-Capillary: 126 mg/dL — ABNORMAL HIGH (ref 70–99)
Glucose-Capillary: 125 mg/dL — ABNORMAL HIGH (ref 70–99)
Glucose-Capillary: 131 mg/dL — ABNORMAL HIGH (ref 70–99)

## 2021-12-09 NOTE — Progress Notes (Signed)
Upon arrival Justin Griffith was on the phone and chaplain acknowledge she would come back. He demanded she come in. She went in and stood as he continued to talk on the phone. He then told Chaplain he would be on the phone awhile.     12/09/21 1800  Clinical Encounter Type  Visited With Patient  Visit Type Initial  Spiritual Encounters  Spiritual Needs Emotional

## 2021-12-09 NOTE — Progress Notes (Addendum)
Progress Note    Justin Griffith  FUX:[id number] DOB: 1940/06/13  DOA: 12/05/2021 PCP: Charlynne Cousins, MD (Inactive)      Brief Narrative:    Medical records reviewed and are as summarized below:  Justin Griffith is a 81 y.o. male with medical history significant for dementia, hypertension, hyperlipidemia, COPD, type 2 diabetes, chronic diastolic CHF, BPH with chronic urinary retention and indwelling Foley catheter in place, history of pulmonary embolism on Eliquis, history of recurrent falls, who presented to the hospital after a fall at home.  He lives with his wife at home who has dementia.  He complained of headache and he was found to have posterior right scalp laceration.      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  (Obesity)   S/p mechanical fall, recurrent falls at home, ambulatory dysfunction, posterior right scalp laceration s/p repair with staples on 12/05/2021: PT and OT recommended discharge to SNF.  However, insurance company denied authorization for placement to SNF.  I did a peer to peer review.  I spoke to Dr. Amalia Hailey insurance company Market researcher to discuss the case.  I argued that patient is a high fall risk and will be best served at the nursing facility for rehab.  However, Dr. Amalia Hailey felt that patient had been to rehab twice within the past couple of months but he has not benefited from it, and therefore he was unlikely to benefit from rehab at this point.  Chronic diastolic CHF hypertension: Compensated.  Continue  antihypertensives.  2D echo on 09/20/2021 showed EF estimated at 66 5%.  Type II DM: Glucose levels are okay.  Continue insulin glargine and NovoLog as needed for hyperglycemia.    History of pulmonary embolism: Continue Eliquis  COPD: Compensated  BPH, chronic urinary retention, recently treated for UTI: Continue indwelling Foley catheter.  No urinary symptoms.  Trimethoprim was discontinued on 12/06/2021  Early dementia, history of stroke: Continue supportive care  Other comorbidities include CKD stage IIIb, OSA (refuses to use CPAP in the hospital), hyperlipidemia  Discussed disposition plans with the patient.  He was informed that insurance authorization was denied.  He was furious about this decision.  He said "I am tired of going through this shit, I am tired of going through insurance.  I pay an arm and leg and I have to go through this with insurance".  I told him that he could appeal with the decision if he does not agree with it.  He said " I will just go home.  If I fall again, I will call EMS and they will bring me back here".  Initially, patient said he would call his sister-in-law to come and help them at home.  I spoke to [place] patient's (stepdaughter) about patient's plans but she said that she does not think her mom's sister will be willing to help again this time.  Patient asked me to call his son, [name]. I called his son at [phone] but there was no response  I left voice message for him to call back.  Patient is now thinking of hiring a private caregiver.  He is hoping he can sort this out by tomorrow.  Of note, patient has capacity to make decisions at this time.  He is fully oriented and fully aware of what is going on.  He acknowledged that he is at high risk for recurrent falls because of "neuropathy" and that his wife cannot help him at home.   Diet Order             Diet heart healthy/carb modified Room service appropriate? Yes; Fluid consistency:  Thin  Diet effective now                            Consultants: None  Procedures: Posterior right scalp laceration repair with staples on 12/05/2021 in the ED    Medications:    apixaban  5 mg Oral BID   arformoterol  15 mcg Nebulization BID   And   umeclidinium bromide  1 puff Inhalation Daily   And   budesonide (PULMICORT) nebulizer solution  0.5 mg Nebulization BID   carvedilol  3.125 mg Oral BID WC   Chlorhexidine Gluconate Cloth  6 each Topical Daily   cloNIDine  0.1 mg Oral BID   cyanocobalamin  1,000 mcg Oral QHS   feeding supplement (GLUCERNA SHAKE)  237 mL Oral TID BM   hydrALAZINE  50 mg Oral TID   insulin aspart  0-20 Units Subcutaneous TID WC   insulin glargine-yfgn  10 Units Subcutaneous QHS   magnesium oxide  400 mg Oral Daily   pantoprazole  40 mg Oral Daily   rosuvastatin  10 mg Oral QHS   sodium chloride flush  3 mL Intravenous Q12H   Continuous Infusions:  sodium chloride       Anti-infectives (From admission, onward)    Start     Dose/Rate Route Frequency Ordered Stop   12/05/21 1230  trimethoprim (TRIMPEX) tablet 100 mg  Status:  Discontinued        100 mg Oral Daily 12/05/21 0842 12/06/21 1454   12/05/21 1000  clindamycin (CLEOCIN) capsule 300 mg  Status:  Discontinued        300 mg Oral 3 times daily 12/05/21 0827 12/05/21 0842              Family Communication/Anticipated D/C date and plan/Code Status   DVT prophylaxis:  apixaban (ELIQUIS) tablet 5 mg     Code Status: DNR  Family Communication: Plan discussed with his wife and stepdaughter at the bedside Disposition Plan: Plan to discharge to SNF in 1 to 2 days   Status is: Inpatient Remains inpatient appropriate because: Unsafe discharge plan.  He needs SNF placement.       Subjective:   Interval events noted.  He is frustrated that he is still in the hospital because of delay in insurance authorization.  Objective:    Vitals:   12/08/21 2009  12/08/21 2032 12/09/21 0351 12/09/21 0756  BP:  (!) 172/77 (!) 159/76   Pulse:  62 63 63  Resp:  '20 17 18  '$ Temp:  97.7 F (36.5 C) 97.8 F (36.6 C)   TempSrc:  Oral Oral   SpO2: 97% 96% 95% 96%  Weight:      Height:       No data found.   Intake/Output Summary (Last 24 hours) at 12/09/2021 1433 Last data filed at  12/09/2021 0920 Gross per 24 hour  Intake 360 ml  Output 1800 ml  Net -1440 ml   Filed Weights   12/05/21 0511 12/07/21 2346  Weight: 106 kg 103.1 kg    Exam:  GEN: NAD SKIN: Warm and dry EYES: EOMI ENT: MMM CV: RRR PULM: CTA B ABD: soft, ND, NT, +BS CNS: AAO x 3, non focal EXT: No edema or tenderness GU: Foley catheter draining amber urine      Data Reviewed:   I have personally reviewed following labs and imaging studies:  Labs: Labs show the following:   Basic Metabolic Panel: Recent Labs  Lab 12/05/21 0616 12/06/21 0929  NA 141 140  K 3.4* 3.5  CL 108 108  CO2 26 27  GLUCOSE 157* 138*  BUN 13 12  CREATININE 1.14 1.16  CALCIUM 10.6* 9.9  MG  --  1.7  PHOS  --  3.1   GFR Estimated Creatinine Clearance: 61 mL/min (by C-G formula based on SCr of 1.16 mg/dL). Liver Function Tests: Recent Labs  Lab 12/05/21 0616  AST 23  ALT 24  ALKPHOS 57  BILITOT 0.5  PROT 7.2  ALBUMIN 3.2*   No results for input(s): "LIPASE", "AMYLASE" in the last 168 hours. No results for input(s): "AMMONIA" in the last 168 hours. Coagulation profile No results for input(s): "INR", "PROTIME" in the last 168 hours.  CBC: Recent Labs  Lab 12/05/21 0616  WBC 7.3  NEUTROABS 5.1  HGB 11.9*  HCT 36.8*  MCV 90.4  PLT 315   Cardiac Enzymes: No results for input(s): "CKTOTAL", "CKMB", "CKMBINDEX", "TROPONINI" in the last 168 hours. BNP (last 3 results) No results for input(s): "PROBNP" in the last 8760 hours. CBG: Recent Labs  Lab 12/08/21 1236 12/08/21 1617 12/08/21 2044 12/09/21 0825 12/09/21 1158  GLUCAP 158* 114* 149* 147* 126*    D-Dimer: No results for input(s): "DDIMER" in the last 72 hours. Hgb A1c: No results for input(s): "HGBA1C" in the last 72 hours. Lipid Profile: No results for input(s): "CHOL", "HDL", "LDLCALC", "TRIG", "CHOLHDL", "LDLDIRECT" in the last 72 hours. Thyroid function studies: No results for input(s): "TSH", "T4TOTAL", "T3FREE", "THYROIDAB" in the last 72 hours.  Invalid input(s): "FREET3" Anemia work up: No results for input(s): "VITAMINB12", "FOLATE", "FERRITIN", "TIBC", "IRON", "RETICCTPCT" in the last 72 hours.  Sepsis Labs: Recent Labs  Lab 12/05/21 0616  WBC 7.3    Microbiology Recent Results (from the past 240 hour(s))  Urine Culture     Status: None   Collection Time: 12/05/21  6:06 AM   Specimen: Urine, Random  Result Value Ref Range Status   Specimen Description   Final    URINE, RANDOM Performed at [facility], [address], [place], [place] [address]    Special Requests   Final    NONE Performed at [facility], [address], [place], [place] [address]    Culture   Final    NO GROWTH Performed at [facility], [place] [address] [place],  [address]    Report Status 12/06/2021 FINAL  Final    Procedures and diagnostic studies:  No results found.             LOS: 4 days   Chyane Greer  [facility]   Pager on [email or web address]. If 7PM-7AM, please contact night-coverage at [email or web address]     12/09/2021, 2:33 PM

## 2021-12-09 NOTE — Plan of Care (Signed)

## 2021-12-09 NOTE — Progress Notes (Signed)
Occupational Therapy Treatment Patient Details Name: Justin Griffith MRN: [id number] DOB: 02-Apr-1940 Today's Date: 12/09/2021   History of present illness Patient is a 81 year old male with  history of dementia, hypertension, hyperlipidemia, COPD, diabetes, PE on Eliquis who presents to the emergency department EMS after he had an unwitnessed fall at home. CT of head Large right posterior convexity scalp hematoma/laceration no underlying skull fracture   OT comments  Upon entering session, pt resting in bed and agreeable to OT. Pt required Max A for supine <> sit and completed STS x2 from EOB with Max A. One mild LOB noted with pt impulsively sitting down on EOB due to feeling like his legs were going to buckle. Pt took several steps at EOB this date with Min A using RW. Pt completed grooming tasks while sitting EOB with set up A and required Max A for LB dressing. Pt reported feeling weak and was educated on BUE/LE AROM exercises for strengthening. Pt left as received with all needs in reach. Pt is making progress toward goal completion. D/C recommendation remains appropriate. OT will continue to follow acutely.     Recommendations for follow up therapy are one component of a multi-disciplinary discharge planning process, led by the attending physician.  Recommendations may be updated based on patient status, additional functional criteria and insurance authorization.    Follow Up Recommendations  Skilled nursing-short term rehab (<3 hours/day)    Assistance Recommended at Discharge Frequent or constant Supervision/Assistance  Patient can return home with the following  Two people to help with walking and/or transfers;Two people to help with bathing/dressing/bathroom;Assistance with cooking/housework;Assist for transportation;Help with stairs or ramp for entrance   Equipment Recommendations  Wheelchair (measurements OT)    Recommendations for Other Services      Precautions / Restrictions  Precautions Precautions: Fall Restrictions Weight Bearing Restrictions: No       Mobility Bed Mobility Overal bed mobility: Needs Assistance Bed Mobility: Supine to Sit, Sit to Supine, Rolling Rolling: Modified independent (Device/Increase time) (for rolling to R side using handrail)   Supine to sit: Max assist Sit to supine: Max assist   General bed mobility comments: Verbal cues for sequencing, increased time and effort required. assistance for trunk and BLE support. Able to scoot hips laterally toward [facility] with supervision    Transfers Overall transfer level: Needs assistance Equipment used: Rolling walker (2 wheels) Transfers: Sit to/from Stand Sit to Stand: Max assist           General transfer comment: STS x2 with Max A (impulsively sat down on EOB- "felt like my R leg was about to give out"). x1 attempt STS from EOB unsuccessful, unable to clear bottom from mattress. Verbal cues for hand placement and anterior weight shifting.     Balance Overall balance assessment: Needs assistance Sitting-balance support: Feet supported Sitting balance-Leahy Scale: Fair Sitting balance - Comments: supervision static sitting Postural control: Right lateral lean Standing balance support: Bilateral upper extremity supported Standing balance-Leahy Scale: Poor Standing balance comment: occasional posterior lean in standing, RW for UE support                           ADL either performed or assessed with clinical judgement   ADL Overall ADL's : Needs assistance/impaired     Grooming: Wash/dry face;Sitting;Set up;Oral care               Lower Body Dressing: Maximal assistance  Functional mobility during ADLs: Rolling walker (2 wheels);Cueing for safety;Cueing for sequencing;Minimal assistance (to take ~3 lateral steps at EOB, Min A for RW management)      Extremity/Trunk Assessment Upper Extremity Assessment Upper Extremity Assessment:  Generalized weakness   Lower Extremity Assessment Lower Extremity Assessment: Generalized weakness        Vision Patient Visual Report: No change from baseline     Perception     Praxis      Cognition Arousal/Alertness: Awake/alert Behavior During Therapy: WFL for tasks assessed/performed Overall Cognitive Status: Within Functional Limits for tasks assessed                                 General Comments: Pt able to follow one step commands. Endorsing feeling frustrated with laying in bed all day. Pt concerned about when he's going to rehab (sent secure chat to CM notifying about pt questions- CM spoke with dtr on 9/18 and auth pending for [employer]).        Exercises Other Exercises Other Exercises: OT provided education on: BUE/LE AROM exercises for strengthening    Shoulder Instructions       General Comments      Pertinent Vitals/ Pain       Pain Assessment Pain Assessment: No/denies pain  Home Living                                          Prior Functioning/Environment              Frequency  Min 2X/week        Progress Toward Goals  OT Goals(current goals can now be found in the care plan section)  Progress towards OT goals: Progressing toward goals  Acute Rehab OT Goals Patient Stated Goal: go home (pt previously stated he wanted to go to rehab) OT Goal Formulation: With patient Time For Goal Achievement: 12/19/21 Potential to Achieve Goals: [place] Discharge plan remains appropriate;Frequency remains appropriate    Co-evaluation                 AM-PAC OT "6 Clicks" Daily Activity     Outcome Measure   Help from another person eating meals?: A Little Help from another person taking care of personal grooming?: A Lot Help from another person toileting, which includes using toliet, bedpan, or urinal?: A Lot Help from another person bathing (including washing, rinsing, drying)?: A Lot Help  from another person to put on and taking off regular upper body clothing?: A Little Help from another person to put on and taking off regular lower body clothing?: A Lot 6 Click Score: 14    End of Session Equipment Utilized During Treatment: Rolling walker (2 wheels);Gait belt  OT Visit Diagnosis: Unsteadiness on feet (R26.81);Other abnormalities of gait and mobility (R26.89);Muscle weakness (generalized) (M62.81);History of falling (Z91.81);Pain   Activity Tolerance Patient tolerated treatment well;Patient limited by fatigue   Patient Left in bed;with call bell/phone within reach;with bed alarm set   Nurse Communication Mobility status        Time: 6378-5885 OT Time Calculation (min): 30 min  Charges: OT General Charges $OT Visit: 1 Visit OT Treatments $Self Care/Home Management : 23-37 mins   [facility] MS, OTR/L ascom [phone]  12/09/21, 10:43 AM

## 2021-12-09 NOTE — TOC Progression Note (Signed)
Transition of Care [facility]) - Progression Note    Patient Details  Name: Justin Griffith MRN: [id number] Date of Birth: 02-Sep-1940  [facility] CM/SW Contact  Beverly Sessions, RN Phone Number: 12/09/2021, 2:02 PM  Clinical Narrative:     Notified by Lynelle Smoke at [facility] that Josem Kaufmann has been denied, and peer to peer has been offered Per MD peer to Peer has been completed, and TOC awaiting for official determination from [facility]  Patient states that if it is denied he wishes to return home with home health Daughter updated  Auth obtained for [facility]. [id number]   Expected Discharge Plan: Skilled Nursing Facility Barriers to Discharge: Continued Medical Work up  Expected Discharge Plan and Services Expected Discharge Plan: [place]       Living arrangements for the past 2 months: Single Family Home                                       Social Determinants of Health (SDOH) Interventions    Readmission Risk Interventions    11/24/2021   10:33 AM 09/21/2021   10:42 AM 05/21/2020   10:01 AM  Readmission Risk Prevention Plan  Transportation Screening Complete Complete Complete  PCP or Specialist Appt within 3-5 Days  Complete Complete  HRI or Home Care Consult  Complete Complete  Social Work Consult for [place] Planning/Counseling  Complete Complete  Palliative Care Screening  Not Applicable Not Applicable  Medication Review Press photographer) Complete Complete Complete  PCP or Specialist appointment within 3-5 days of discharge Complete    HRI or Home Care Consult Complete    SW Recovery Care/Counseling Consult Complete    Palliative Care Screening Not [place] Complete

## 2021-12-09 NOTE — Progress Notes (Signed)
Physical Therapy Treatment Patient Details Name: Justin Griffith MRN: [id number] DOB: 1940-06-24 Today's Date: 12/09/2021   History of Present Illness Patient is a 81 year old male with  history of dementia, hypertension, hyperlipidemia, COPD, diabetes, PE on Eliquis who presents to the emergency department EMS after he had an unwitnessed fall at home. CT of head Large right posterior convexity scalp hematoma/laceration no underlying skull fracture    PT Comments    Patient is agreeable to PT session. 2 bouts of standing performed with maximal assistance required. Limited standing tolerance and fatigue with minimal activity. Recommend PT follow up to maximize independence. SNF will be required at discharge.    Recommendations for follow up therapy are one component of a multi-disciplinary discharge planning process, led by the attending physician.  Recommendations may be updated based on patient status, additional functional criteria and insurance authorization.  Follow Up Recommendations  Skilled nursing-short term rehab (<3 hours/day) Can patient physically be transported by private vehicle: No   Assistance Recommended at Discharge Frequent or constant Supervision/Assistance  Patient can return home with the following Two people to help with walking and/or transfers;A lot of help with bathing/dressing/bathroom;Help with stairs or ramp for entrance;Assist for transportation;Assistance with cooking/housework;Direct supervision/assist for medications management   Equipment Recommendations  None recommended by PT    Recommendations for Other Services       Precautions / Restrictions Precautions Precautions: Fall Restrictions Weight Bearing Restrictions: No     Mobility  Bed Mobility Overal bed mobility: Needs Assistance Bed Mobility: Sit to Supine, Supine to Sit     Supine to sit: Max assist Sit to supine: Max assist   General bed mobility comments: assistance for trunk and  BLE support. increased time and effort required    Transfers Overall transfer level: Needs assistance Equipment used: Rolling walker (2 wheels) Transfers: Sit to/from Stand Sit to Stand: Max assist           General transfer comment: Max A required for standing with significant assistance required for lift off. verbal cues for technique. patient stood x 2 bouts but attempted several other times to stand unsuccessfully. patient is limited by fatigue    Ambulation/Gait               General Gait Details: unable to due to poor standing tolerance and generalized weakness   Stairs             Wheelchair Mobility    Modified Rankin (Stroke Patients Only)       Balance Overall balance assessment: Needs assistance Sitting-balance support: Feet supported Sitting balance-Leahy Scale: Fair Sitting balance - Comments: Min guard for safety. intermittent left lean Postural control: Left lateral lean                                  Cognition Arousal/Alertness: Awake/alert Behavior During Therapy: WFL for tasks assessed/performed Overall Cognitive Status: Within Functional Limits for tasks assessed                                          Exercises      General Comments General comments (skin integrity, edema, etc.): noted a very small scab on the left knee came off during mobility. bandaide provided to left knee per patient request. minimal blood noted.  Pertinent Vitals/Pain Pain Assessment Pain Assessment: No/denies pain    Home Living                          Prior Function            PT Goals (current goals can now be found in the care plan section) Acute Rehab PT Goals Patient Stated Goal: to return home PT Goal Formulation: With patient Time For Goal Achievement: 12/19/21 Potential to Achieve Goals: Fair Progress towards PT goals: Progressing toward goals    Frequency    Min 2X/week       PT Plan Current plan remains appropriate    Co-evaluation              AM-PAC PT "6 Clicks" Mobility   Outcome Measure  Help needed turning from your back to your side while in a flat bed without using bedrails?: A Lot Help needed moving from lying on your back to sitting on the side of a flat bed without using bedrails?: A Lot Help needed moving to and from a bed to a chair (including a wheelchair)?: A Lot Help needed standing up from a chair using your arms (e.g., wheelchair or bedside chair)?: A Lot Help needed to walk in hospital room?: Total Help needed climbing 3-5 steps with a railing? : Total 6 Click Score: 10    End of Session   Activity Tolerance: Patient tolerated treatment well;Patient limited by fatigue Patient left: in bed;with call bell/phone within reach;with bed alarm set   PT Visit Diagnosis: Unsteadiness on feet (R26.81);Muscle weakness (generalized) (M62.81);Difficulty in walking, not elsewhere classified (R26.2)     Time: 1130-1150 PT Time Calculation (min) (ACUTE ONLY): 20 min  Charges:  $Therapeutic Activity: 8-22 mins                    Justin Griffith, PT, MPT    Justin Griffith 12/09/2021, 12:55 PM

## 2021-12-10 DIAGNOSIS — F0390 Unspecified dementia without behavioral disturbance: Secondary | ICD-10-CM | POA: Diagnosis not present

## 2021-12-10 DIAGNOSIS — E1169 Type 2 diabetes mellitus with other specified complication: Secondary | ICD-10-CM | POA: Diagnosis not present

## 2021-12-10 DIAGNOSIS — R262 Difficulty in walking, not elsewhere classified: Secondary | ICD-10-CM | POA: Diagnosis not present

## 2021-12-10 LAB — GLUCOSE, CAPILLARY
Glucose-Capillary: 154 mg/dL — ABNORMAL HIGH (ref 70–99)
Glucose-Capillary: 155 mg/dL — ABNORMAL HIGH (ref 70–99)
Glucose-Capillary: 145 mg/dL — ABNORMAL HIGH (ref 70–99)

## 2021-12-10 LAB — BASIC METABOLIC PANEL
Anion gap: 3 — ABNORMAL LOW (ref 5–15)
BUN: 18 mg/dL (ref 8–23)
CO2: 27 mmol/L (ref 22–32)
Calcium: 9.8 mg/dL (ref 8.9–10.3)
Chloride: 106 mmol/L (ref 98–111)
Creatinine, Ser: 1.08 mg/dL (ref 0.61–1.24)
GFR, Estimated: >60 mL/min (ref >60)
Glucose, Bld: 134 mg/dL — ABNORMAL HIGH (ref 70–99)
Potassium: 4.3 mmol/L (ref 3.5–5.1)
Sodium: 136 mmol/L (ref 135–145)

## 2021-12-10 LAB — CBC
HCT: 33.3 % — ABNORMAL LOW (ref 39.0–52.0)
Hemoglobin: 10.9 g/dL — ABNORMAL LOW (ref 13.0–17.0)
MCH: 29.5 pg (ref 26.0–34.0)
MCHC: 32.7 g/dL (ref 30.0–36.0)
MCV: 90 fL (ref 80.0–100.0)
Platelets: 270 10*3/uL (ref 150–400)
RBC: 3.7 MIL/uL — ABNORMAL LOW (ref 4.22–5.81)
RDW: 14.3 % (ref 11.5–15.5)
WBC: 5.5 10*3/uL (ref 4.0–10.5)
nRBC: 0 % (ref 0.0–0.2)

## 2021-12-10 NOTE — TOC Transition Note (Signed)
Transition of Care [facility]) - CM/SW Discharge Note   Patient Details  Name: Justin Griffith MRN: [id number] Date of Birth: Feb 23, 1941  [facility] CM/SW Contact:  Beverly Sessions, RN Phone Number: 12/10/2021, 2:32 PM   Clinical Narrative:     Denial letter from [facility] provided to patient.  Patient does not wish to appeal and states he wants to go home Agreeable to home health services and PCS services.  States he does not have a preference of agency.  Referral for PT, OT, RN, aide and SW made to [place] with [facility].  They will start [place], and will not be able to see patient until auth approved. MD and patient updated - Tammy with [facility] confirms that patient has custodial care benefits at 20 hours a week.   Referral made to [facility] with Mickel Crow.   - APS report made -receive call from [facility] with [facility] stating they are also following the patient as well and available for any urgent care needs  With patients request updated step daughter [name]. She confirms family will be at the home to accept patient by EMS Referral made to [facility] with [facility] for [facility] and WC.  [name] states that her husband will pick it up from the retail store.  Faythe Dingwall with [employer] notified  EMS transport called and has been authorized by [facility].      Barriers to Discharge: Continued Medical Work up   Patient Goals and CMS Choice Patient states their goals for this hospitalization and ongoing recovery are:: SNF CMS Medicare.gov Compare Post Acute Care list provided to:: Patient Choice offered to / list presented to : Patient  Discharge Placement                       Discharge Plan and Services                                     Social Determinants of Health (SDOH) Interventions     Readmission Risk Interventions    11/24/2021   10:33 AM 09/21/2021   10:42 AM 05/21/2020   10:01 AM  Readmission Risk Prevention Plan  Transportation Screening Complete Complete Complete   PCP or Specialist Appt within 3-5 Days  Complete Complete  HRI or Home Care Consult  Complete Complete  Social Work Consult for [place] Planning/Counseling  Complete Complete  Palliative Care Screening  Not Applicable Not Applicable  Medication Review Press photographer) Complete Complete Complete  PCP or Specialist appointment within 3-5 days of discharge Complete    HRI or Home Care Consult Complete    SW Recovery Care/Counseling Consult Complete    Palliative Care Screening Not [place] Complete

## 2021-12-10 NOTE — Progress Notes (Signed)
Patient is not able to walk the distance required to go the bathroom, or he/she is unable to safely negotiate stairs required to access the bathroom.  A BSC will alleviate this problem. 

## 2021-12-10 NOTE — Progress Notes (Signed)
    Durable Medical Equipment  (From admission, onward)           Start     Ordered   12/10/21 1342  For home use only DME Bedside commode  Once       Question:  Patient needs a bedside commode to treat with the following condition  Answer:  Weakness   12/10/21 1343   12/10/21 1342  For home use only DME lightweight manual wheelchair with seat cushion  Once       Comments: Patient suffers from COPD/CHF which impairs their ability to perform daily activities like toileting in the home.  A walker will not resolve  issue with performing activities of daily living. A wheelchair will allow patient to safely perform daily activities. Patient is not able to propel themselves in the home using a standard weight wheelchair due to general weakness. Patient can self propel in the lightweight wheelchair. Length of need Lifetime. Accessories: elevating leg rests (ELRs), wheel locks, extensions and anti-tippers.   12/10/21 1343

## 2021-12-10 NOTE — Progress Notes (Signed)
  Chaplain On-Call received referral from [facility] to visit the patient in response to [place] Order from Jennye Boroughs, MD.  Chaplain met the patient and offered listening support as the patient described his health challenges, and the stressors associated with caring for his wife who has Dementia.  The patient that he will be discharged this afternoon, and stated that he is apprehensive about how he will be able to care for his wife. The patient also stated his reliance on prayer, and the meaningful connections he has with the congregation and Pastor of [place].  Chaplain provided much spiritual and emotional support, and prayer with the patient.  Chaplain Pollyann Samples M.Div., [facility]

## 2021-12-10 NOTE — Discharge Summary (Addendum)
Physician Discharge Summary  Justin Griffith SHF:[id number] DOB: 1940-06-10 DOA: 12/05/2021  PCP: Charlynne Cousins, MD (Inactive)  Admit date: 12/05/2021 Discharge date: 12/10/2021  Admitted From: home  Disposition:  home w/ home health   Recommendations for Outpatient Follow-up:  Follow up with PCP in 1 week   Home Health: yes Equipment/Devices:  Discharge Condition: stable  CODE STATUS: DNR Diet recommendation: Heart Healthy / Carb Modified   Brief/Interim Summary: HPI was taken from Dr. Linda Hedges: Justin Griffith, an 81 y/o history of dementia, hypertension, hyperlipidemia, COPD, diabetes, PE on Eliquis who presents to the emergency department EMS after he had an unwitnessed fall at home.  Patient complaining of headache. He has h/o frequent falls. EMS exam revealed scalp laceration. He is transported to [facility] for evaluation.     ED Course: Seen by EDP: CT C-spine with multi-level DJD with significant stenosis at C4-5, C5-6, CT brain w/o acute injury. Laceration on scalp close with 2 staples. Patient lives with his wife who has advanced dementia. He is unable to be discharged home due to very poor balance, inability to ambulate safely, inadequate care at home to manage his mobility issues safely given h/o multiple falls while fully anti-coagulated on Eliquis.    Patient with indwelling foley catheter. U/A mildly positive after 6 days of clindamycin. 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    TRH called to admit patient for further evaluation and management of medical problems, develop safe discharge plan  As per Dr. Mal Misty: Justin Griffith is a 81 y.o. male with medical history significant for dementia, hypertension, hyperlipidemia, COPD, type 2 diabetes, chronic diastolic CHF, BPH with chronic urinary retention and indwelling Foley catheter in place, history of pulmonary embolism on Eliquis, history of recurrent falls, who presented to the hospital after a fall at home.  He lives with his wife  at home who has dementia.  He complained of headache and he was found to have posterior right scalp laceration   As per Dr. Jimmye Norman 12/10/21: Pt was medically stable for d/c. OT/PT recs SNF but unfortunately pt's insurance denied SNF despite a peer to peer done by Dr. Mal Misty. Home health was set up by CM and CM requested custodial care for the pt as well. For more information, please see previous progress/consult notes.   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 Posterior right scalp laceration s/p repair with staples on 12/05/2021: PT/OT recs SNF. Insurance denied SNF.   Chronic diastolic CHF: appears euvolemic.  Echo on 09/20/2021 showed EF estimated at 60-65%. Continue on coreg    DM2: likely poorly controlled. Continue on glargine, SSI w/ accuchecks    Hx of PE: continue on eliquis    COPD: w/o exacerbation. Continue on bronchodilators    BPH w/ chronic urinary retention:  continue indwelling Foley catheter.  Recently completed abx course for UTI,  trimethoprim was discontinued on 12/06/2021   Dementia: w/ hx of CVA. Continue w/ supportive care   Deep tissue pressure injury bilateral buttocks: present on admission (at the time of the admission order).  Continue w/ wound care  Discharge Instructions  Discharge Instructions     Diet - low sodium heart healthy   Complete by: As directed    Diet Carb Modified   Complete by: As directed    Discharge instructions   Complete by: As directed    F/u w/ PCP in 1 week   Increase  activity slowly   Complete by: As directed    No wound care   Complete by: As directed       Allergies as of 12/10/2021       Reactions   Farxiga [dapagliflozin] Rash   Causes a severe rash in groin area   Dulaglutide Diarrhea, Other (See Comments)   Liraglutide Diarrhea, Other (See Comments)   Jardiance [empagliflozin] Rash        Medication List     STOP taking these medications    clindamycin 300 MG capsule Commonly known as: CLEOCIN       TAKE these medications    acetaminophen 325 MG tablet Commonly known as: TYLENOL Take 650 mg by mouth every 6 (six) hours as needed.   aluminum-magnesium hydroxide-simethicone 371-696-78 MG/5ML Susp Commonly known as: MAALOX Take 30 mLs by mouth 4 (four) times daily -  before meals and at bedtime.   apixaban 2.5 MG Tabs tablet Commonly known as: ELIQUIS Take 1 tablet (2.5 mg total) by mouth 2 (two) times daily.   ascorbic acid 500 MG tablet Commonly known as: VITAMIN C Take 1 tablet (500 mg total) by mouth daily.   Breztri Aerosphere 160-9-4.8 MCG/ACT Aero Generic drug: Budeson-Glycopyrrol-Formoterol Inhale 1 puff into the lungs daily.   carvedilol 3.125 MG tablet Commonly known as: COREG Take 1 tablet (3.125 mg total) by mouth 2 (two) times daily with a meal.   cloNIDine 0.1 MG tablet Commonly known as: CATAPRES Take 0.1 mg by mouth 2 (two) times daily.   cyanocobalamin 1000 MCG tablet Take 1 tablet (1,000 mcg total) by mouth at bedtime.   feeding supplement (GLUCERNA SHAKE) Liqd Take 237 mLs by mouth 3 (three) times daily between meals.   gabapentin 100 MG capsule Commonly known as: Neurontin Take 1 capsule (100 mg total) by mouth 3 (three) times daily. What changed: when to take this   hydrALAZINE 50 MG tablet Commonly known as: APRESOLINE TAKE ONE TABLET BY MOUTH THREE TIMES A DAY FOR BLOOD PRESSURE   insulin aspart 100 UNIT/ML injection Commonly known as: novoLOG For glucose 121-150 give 1 unit  subcutaneous insulin; 151- 200: 2 units; 201 to 250: 3 units; 251-300: 5 units; 301 to 350: 7 units; 351 and above 9 units.   insulin glargine-yfgn 100 UNIT/ML Pen Commonly known as: SEMGLEE Inject 5 Units into the skin at bedtime.   magnesium oxide 400 (240 Mg) MG tablet Commonly known as: MAG-OX Take 1 tablet (400 mg total) by mouth daily.   multivitamin with minerals Tabs tablet Take 1 tablet by mouth daily.   pantoprazole 40 MG tablet Commonly known as: PROTONIX Take 40 mg by mouth daily.   rosuvastatin 10 MG tablet Commonly known as: CRESTOR Take 1 tablet (10 mg total) by mouth at bedtime.   Spiriva Respimat 2.5 MCG/ACT Aers Generic drug: Tiotropium Bromide Monohydrate Inhale 2 puffs into the lungs daily.   Vitamin D (Cholecalciferol) 25 MCG (1000 UT) Caps Take 2,000 mcg by mouth at bedtime.        Contact information for follow-up providers     Schedule an appointment as soon as possible for a  visit  with Charlynne Cousins, MD.   Specialty: Internal Medicine Contact information: [place] [address] [phone]              Contact information for after-discharge care     Destination     [facility] .   Service: Skilled Nursing Contact information: [address] [place] [phone]                    Allergies  Allergen Reactions   [place] [Dapagliflozin] Rash    Causes a severe rash in groin area    Dulaglutide Diarrhea and Other (See Comments)   Liraglutide Diarrhea and Other (See Comments)   Jardiance [Empagliflozin] Rash    Consultations:    Procedures/Studies: CT Cervical Spine Wo Contrast  Result Date: 12/05/2021 CLINICAL DATA:  81 year old male status post unwitnessed fall at home. Posterior laceration. EXAM: CT CERVICAL SPINE WITHOUT CONTRAST TECHNIQUE: Multidetector CT imaging of the cervical spine was performed without intravenous contrast.  Multiplanar CT image reconstructions were also generated. RADIATION DOSE REDUCTION: This exam was performed according to the departmental dose-optimization program which includes automated exposure control, adjustment of the mA and/or kV according to patient size and/or use of iterative reconstruction technique. COMPARISON:  CT head today.  Cervical spine CT 10/30/2021. FINDINGS: Alignment: Chronic straightening of cervical lordosis. Cervicothoracic junction alignment is within normal limits. Bilateral posterior element alignment is within normal limits. Skull base and vertebrae: Visualized skull base is intact. No atlanto-occipital dissociation. C1 and C2 appear stable and intact. Chronic anterior odontoid subchondral cyst. No acute osseous abnormality identified. Soft tissues and spinal canal: No prevertebral fluid or swelling. No visible canal hematoma. Stable noncontrast neck soft tissues including bulky calcified carotid atherosclerosis. Disc levels: Chronic severe cervical spine degeneration. Bulky facet degeneration, disc and endplate degeneration. Multilevel cervical spinal stenosis appears stable and maximal at C4-C5 and C5-C6. Upper chest: Visible upper thoracic levels appear intact. Negative lung apices and noncontrast thoracic inlet. Other: Chronically absent dentition. Visible facial bones appear intact. IMPRESSION: 1. No acute traumatic injury identified in the cervical spine. 2. Chronic severe cervical spine degeneration. Stable CT appearance of multilevel cervical spinal stenosis, maximal at C4-C5 and C5-C6. Electronically Signed   By: Genevie Ann M.D.   On: 12/05/2021 06:18   CT HEAD WO CONTRAST (5MM)  Result Date: 12/05/2021 CLINICAL DATA:  81 year old male status post unwitnessed fall at home. Posterior laceration. EXAM: CT HEAD WITHOUT CONTRAST TECHNIQUE: Contiguous axial images were obtained from the base of the skull through the vertex without intravenous contrast. RADIATION DOSE REDUCTION:  This exam was performed according to the departmental dose-optimization program which includes automated exposure control, adjustment of the mA and/or kV according to patient size and/or use of iterative reconstruction technique. COMPARISON:  Head CT 11/17/2021. FINDINGS: Brain: Stable cerebral volume. Chronic infarct in the left cerebellum PICA territory, confluent bilateral cerebral white matter hypodensity appears stable since last month. Deep gray matter nuclei appear stable with asymmetric heterogeneous hypodensity in the left thalamus, and the right caudothalamic groove. No midline shift, ventriculomegaly, mass effect, evidence of mass lesion, intracranial hemorrhage or evidence of cortically based acute infarction. Vascular: Calcified atherosclerosis at the skull base. Skull: No skull fracture identified. Sinuses/Orbits: Chronic mastoid sclerosis is stable. Tympanic cavities remain clear. Visualized paranasal sinuses are stable and well aerated. Other: Broad-based posterior convexity scalp hematoma on the right. Skin staples and small volume soft tissue gas there. Underlying calvarium appears stable and  intact. Other orbit and scalp soft tissues appears stable. IMPRESSION: 1. Large right posterior convexity scalp hematoma/laceration. No underlying skull fracture. 2. No acute intracranial abnormality. 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 Electronically Signed   By: Genevie Ann M.D.   On: 12/05/2021 06:14   DG Chest Port 1 View  Result Date: 11/18/2021 CLINICAL DATA:  Removal of malpositioned Dobbhoff tube. Rule out pneumothorax EXAM: PORTABLE CHEST 1 VIEW COMPARISON:  Yesterday FINDINGS: No pneumothorax or acute airspace opacity. Cardiomegaly. Coronary stenting. Scarring or atelectasis at the left lung base similar to prior. IMPRESSION: No acute finding. Unchanged scar or atelectatic type density at the left base. Electronically Signed   By:  Jorje Guild M.D.   On: 11/18/2021 07:38   DG Abd 1 View  Result Date: 11/18/2021 CLINICAL DATA:  Confirm Dobbhoff tube placement. EXAM: ABDOMEN - 1 VIEW COMPARISON:  Chest radiograph 11/17/2021 FINDINGS: 0709 hours. The feeding tube is in the left lower lobe airway. Atelectasis is noted in the left base. There are several gaseous distended loops of bowel identified within the abdomen which may reflect ileus versus obstruction. IMPRESSION: 1. Feeding tube is in the left lower lobe airway. Recommend removal and replacement. 2. Left base atelectasis. 3. Gaseous distension of the bowel loops which may reflect underlying ileus or obstruction Electronically Signed   By: Kerby Moors M.D.   On: 11/18/2021 07:37   DG Abd 1 View  Result Date: 11/18/2021 CLINICAL DATA:  Dobbhoff placement EXAM: ABDOMEN - 1 VIEW COMPARISON:  Earlier today FINDINGS: Left lower lobe intubation by the enteric tube. This is already been corrected based on follow-up radiograph. Curvilinear scar atelectasis at the left base. No edema, effusion, or pneumothorax. Cardiomegaly. IMPRESSION: Left lower lobe airway intubation by enteric tube, already corrected at time of dictation. Electronically Signed   By: Jorje Guild M.D.   On: 11/18/2021 07:36   US RENAL  Result Date: 11/18/2021 CLINICAL DATA:  Acute on chronic renal failure EXAM: RENAL / URINARY TRACT ULTRASOUND COMPLETE COMPARISON:  CT AP 09/30/2021 FINDINGS: Right Kidney: Renal measurements: 12.8 x 5.8 x 6.2 cm = volume: 240.3 mL. Echogenicity within normal limits. No mass or hydronephrosis visualized. Left Kidney: Renal measurements: 12.7 x 6.1 x 5.1 cm = volume: 202.9 mL. Perinephric fluid noted around the inferior pole. Echogenicity within normal limits. No mass or hydronephrosis visualized. Bladder: Decompressed around a Foley catheter. Other: None. IMPRESSION: 1. No hydronephrosis identified. 2. Mild lower pole left perinephric fluid. Electronically Signed   By: Kerby Moors M.D.   On: 11/18/2021 05:59   CT Head Wo Contrast  Result Date: 11/17/2021 CLINICAL DATA:  Altered mental status EXAM: CT HEAD WITHOUT CONTRAST TECHNIQUE: Contiguous axial images were obtained from the base of the skull through the vertex without intravenous contrast. RADIATION DOSE REDUCTION: This exam was performed according to the departmental dose-optimization program which includes automated exposure control, adjustment of the mA and/or kV according to patient size and/or use of iterative reconstruction technique. COMPARISON:  MRI brain dated 10/31/2021 FINDINGS: Brain: No evidence of acute infarction, hemorrhage, hydrocephalus, extra-axial collection or mass lesion/mass effect. Subcortical white matter and periventricular small vessel ischemic changes. Old left cerebellar infarct. Vascular: No hyperdense vessel or unexpected calcification. Skull: Normal. Negative for fracture or focal lesion. Sinuses/Orbits: The visualized paranasal sinuses are essentially clear. The mastoid air cells are unopacified. Other: None. IMPRESSION: No evidence of acute intracranial abnormality. Small vessel ischemic changes. Old left  cerebellar infarct. Electronically Signed   By: Julian Hy M.D.   On: 11/17/2021 22:23   DG Chest Port 1 View  Result Date: 11/17/2021 CLINICAL DATA:  Questionable sepsis. EXAM: PORTABLE CHEST 1 VIEW COMPARISON:  Chest x-ray 10/31/2021 FINDINGS: There are patchy airspace opacities in the left lung base with small left pleural effusion. There is a band of atelectasis in the right lung base. Cardiomediastinal silhouette is within normal limits. No pneumothorax or acute fracture. IMPRESSION: 1.  Patchy left lower lobe airspace disease worrisome for infection. 2.  Right basilar atelectasis. Electronically Signed   By: Ronney Asters M.D.   On: 11/17/2021 21:53   (Echo, Carotid, EGD, Colonoscopy, ERCP)    Subjective: Pt c/o fatigue    Discharge Exam: Vitals:   12/10/21 0741  12/10/21 0807  BP:  (!) 153/67  Pulse:  (!) 57  Resp:  16  Temp:  97.9 F (36.6 C)  SpO2: 98% 96%   Vitals:   12/09/21 1959 12/10/21 0420 12/10/21 0741 12/10/21 0807  BP:  (!) 166/79  (!) 153/67  Pulse:  67  (!) 57  Resp:  15  16  Temp:  97.7 F (36.5 C)  97.9 F (36.6 C)  TempSrc:  Oral  Oral  SpO2: 95% 96% 98% 96%  Weight:      Height:        General: Pt is alert, awake, not in acute distress Cardiovascular: S1/S2 +, no rubs, no gallops Respiratory: CTA bilaterally, no wheezing, no rhonchi Abdominal: Soft, NT, obese, bowel sounds + Extremities:  no cyanosis    The results of significant diagnostics from this hospitalization (including imaging, microbiology, ancillary and laboratory) are listed below for reference.     Microbiology: Recent Results (from the past 240 hour(s))  Urine Culture     Status: None   Collection Time: 12/05/21  6:06 AM   Specimen: Urine, Random  Result Value Ref Range Status   Specimen Description   Final    URINE, RANDOM Performed at [facility], [address] [place], [place] [address]    Special Requests   Final    NONE Performed at [facility], [address] [place], [place] [address]    Culture   Final    NO GROWTH Performed at [facility], [place] [address], [place], [place] [address]    Report Status 12/06/2021 FINAL  Final     Labs: BNP (last 3 results) Recent Labs    10/31/21 1915 12/05/21 0616  BNP 103.4* 371.6*   Basic Metabolic Panel: Recent Labs  Lab 12/05/21 0616 12/06/21 0929  NA 141 140  K 3.4* 3.5  CL 108 108  CO2 26 27  GLUCOSE 157* 138*  BUN 13 12  CREATININE 1.14 1.16  CALCIUM 10.6* 9.9  MG  --  1.7  PHOS  --  3.1   Liver Function Tests: Recent Labs  Lab 12/05/21 0616  AST 23  ALT 24  ALKPHOS 57  BILITOT 0.5  PROT 7.2  ALBUMIN 3.2*   No results for input(s): "LIPASE", "AMYLASE" in the last 168 hours. No results for input(s): "AMMONIA" in the last 168  hours. CBC: Recent Labs  Lab 12/05/21 0616  WBC 7.3  NEUTROABS 5.1  HGB 11.9*  HCT 36.8*  MCV 90.4  PLT 315   Cardiac Enzymes: No results for input(s): "CKTOTAL", "CKMB", "CKMBINDEX", "TROPONINI" in the last 168 hours. BNP: Invalid input(s): "POCBNP" CBG: Recent Labs  Lab 12/09/21 1657 12/09/21  2102 12/10/21 0752 12/10/21 0806 12/10/21 1134  GLUCAP 125* 131* 155* 154* 145*   D-Dimer No results for input(s): "DDIMER" in the last 72 hours. Hgb A1c No results for input(s): "HGBA1C" in the last 72 hours. Lipid Profile No results for input(s): "CHOL", "HDL", "LDLCALC", "TRIG", "CHOLHDL", "LDLDIRECT" in the last 72 hours. Thyroid function studies No results for input(s): "TSH", "T4TOTAL", "T3FREE", "THYROIDAB" in the last 72 hours.  Invalid input(s): "FREET3" Anemia work up No results for input(s): "VITAMINB12", "FOLATE", "FERRITIN", "TIBC", "IRON", "RETICCTPCT" in the last 72 hours. Urinalysis    Component Value Date/Time   COLORURINE YELLOW (A) 12/05/2021 0616   APPEARANCEUR HAZY (A) 12/05/2021 0616   APPEARANCEUR Cloudy (A) 09/18/2021 1421   LABSPEC 1.010 12/05/2021 0616   PHURINE 5.0 12/05/2021 0616   GLUCOSEU 50 (A) 12/05/2021 0616   HGBUR SMALL (A) 12/05/2021 0616   BILIRUBINUR NEGATIVE 12/05/2021 0616   BILIRUBINUR Negative 09/18/2021 1421   KETONESUR NEGATIVE 12/05/2021 0616   PROTEINUR 100 (A) 12/05/2021 0616   NITRITE NEGATIVE 12/05/2021 0616   LEUKOCYTESUR SMALL (A) 12/05/2021 0616   Sepsis Labs Recent Labs  Lab 12/05/21 0616  WBC 7.3   Microbiology Recent Results (from the past 240 hour(s))  Urine Culture     Status: None   Collection Time: 12/05/21  6:06 AM   Specimen: Urine, Random  Result Value Ref Range Status   Specimen Description   Final    URINE, RANDOM Performed at [facility], [address] [place], [place] [address]    Special Requests   Final    NONE Performed at [facility], [address],  [place], [place] [address]    Culture   Final    NO GROWTH Performed at [facility], [place] [address], [place], [place] [address]    Report Status 12/06/2021 FINAL  Final     Time coordinating discharge: Over 30 minutes  SIGNED:   Wyvonnia Dusky, MD  [facility] 12/10/2021, 12:58 PM Pager   If 7PM-7AM, please contact night-coverage

## 2021-12-11 ENCOUNTER — Telehealth: Payer: Self-pay | Admitting: Family Medicine

## 2021-12-11 NOTE — Telephone Encounter (Signed)
Home Health Verbal Orders - Caller/Agency: [name] with [employer] called but it will be with [facility] Number: [phone] Requesting OT/PT/Skilled Nursing/Social Work Frequency: [name] says patient has been to the ER a bunch because he has memory issues and can barely walk. She also states his wife has dementia and truly wants the Social Worker to assess the home living conditions. Patient has appointment with the provider tomorrow. Please assist further.  The fax number for [facility] is [phone]

## 2021-12-12 ENCOUNTER — Inpatient Hospital Stay: Payer: HMO | Admitting: Family Medicine

## 2021-12-12 DIAGNOSIS — Z9181 History of falling: Secondary | ICD-10-CM | POA: Diagnosis not present

## 2021-12-12 DIAGNOSIS — Z8673 Personal history of transient ischemic attack (TIA), and cerebral infarction without residual deficits: Secondary | ICD-10-CM | POA: Diagnosis not present

## 2021-12-12 DIAGNOSIS — Z09 Encounter for follow-up examination after completed treatment for conditions other than malignant neoplasm: Secondary | ICD-10-CM | POA: Diagnosis not present

## 2021-12-12 DIAGNOSIS — I2510 Atherosclerotic heart disease of native coronary artery without angina pectoris: Secondary | ICD-10-CM | POA: Diagnosis not present

## 2021-12-12 DIAGNOSIS — Z794 Long term (current) use of insulin: Secondary | ICD-10-CM | POA: Diagnosis not present

## 2021-12-12 DIAGNOSIS — Z7901 Long term (current) use of anticoagulants: Secondary | ICD-10-CM | POA: Diagnosis not present

## 2021-12-12 DIAGNOSIS — J449 Chronic obstructive pulmonary disease, unspecified: Secondary | ICD-10-CM | POA: Diagnosis not present

## 2021-12-12 DIAGNOSIS — I10 Essential (primary) hypertension: Secondary | ICD-10-CM | POA: Diagnosis not present

## 2021-12-12 DIAGNOSIS — I110 Hypertensive heart disease with heart failure: Secondary | ICD-10-CM | POA: Diagnosis not present

## 2021-12-12 DIAGNOSIS — Z7409 Other reduced mobility: Secondary | ICD-10-CM | POA: Diagnosis not present

## 2021-12-12 DIAGNOSIS — R531 Weakness: Secondary | ICD-10-CM | POA: Diagnosis not present

## 2021-12-12 DIAGNOSIS — I509 Heart failure, unspecified: Secondary | ICD-10-CM | POA: Diagnosis not present

## 2021-12-12 DIAGNOSIS — S0191XD Laceration without foreign body of unspecified part of head, subsequent encounter: Secondary | ICD-10-CM | POA: Diagnosis not present

## 2021-12-12 DIAGNOSIS — F0390 Unspecified dementia without behavioral disturbance: Secondary | ICD-10-CM | POA: Diagnosis not present

## 2021-12-12 DIAGNOSIS — E669 Obesity, unspecified: Secondary | ICD-10-CM | POA: Diagnosis not present

## 2021-12-12 DIAGNOSIS — Z79899 Other long term (current) drug therapy: Secondary | ICD-10-CM | POA: Diagnosis not present

## 2021-12-12 DIAGNOSIS — Z86711 Personal history of pulmonary embolism: Secondary | ICD-10-CM | POA: Diagnosis not present

## 2021-12-12 DIAGNOSIS — K639 Disease of intestine, unspecified: Secondary | ICD-10-CM | POA: Diagnosis not present

## 2021-12-12 DIAGNOSIS — L89152 Pressure ulcer of sacral region, stage 2: Secondary | ICD-10-CM | POA: Diagnosis not present

## 2021-12-12 DIAGNOSIS — E1140 Type 2 diabetes mellitus with diabetic neuropathy, unspecified: Secondary | ICD-10-CM | POA: Diagnosis not present

## 2021-12-12 DIAGNOSIS — R2689 Other abnormalities of gait and mobility: Secondary | ICD-10-CM | POA: Diagnosis not present

## 2021-12-12 NOTE — Telephone Encounter (Signed)
No showed appointment today. 

## 2021-12-15 NOTE — Telephone Encounter (Signed)
Per [name], patient is back in the hospital at [facility].

## 2021-12-16 ENCOUNTER — Telehealth: Payer: Self-pay | Admitting: *Deleted

## 2021-12-16 DIAGNOSIS — I10 Essential (primary) hypertension: Secondary | ICD-10-CM | POA: Diagnosis not present

## 2021-12-16 DIAGNOSIS — Z7409 Other reduced mobility: Secondary | ICD-10-CM | POA: Diagnosis not present

## 2021-12-16 DIAGNOSIS — Z09 Encounter for follow-up examination after completed treatment for conditions other than malignant neoplasm: Secondary | ICD-10-CM | POA: Diagnosis not present

## 2021-12-16 NOTE — Telephone Encounter (Signed)
Transition Care Management Unsuccessful Follow-up Telephone Call  Date of discharge and from where:  [facility]  9-22   Attempts:  1st Attempt  Reason for unsuccessful TCM follow-up call:  Left voice message  Patient needs to schedule a follow up appointment from hospital

## 2021-12-27 DIAGNOSIS — I69351 Hemiplegia and hemiparesis following cerebral infarction affecting right dominant side: Secondary | ICD-10-CM | POA: Diagnosis not present

## 2022-01-27 ENCOUNTER — Ambulatory Visit: Payer: HMO | Admitting: Family Medicine

## 2022-01-29 ENCOUNTER — Ambulatory Visit (INDEPENDENT_AMBULATORY_CARE_PROVIDER_SITE_OTHER): Payer: HMO | Admitting: Urology

## 2022-01-29 ENCOUNTER — Ambulatory Visit: Payer: HMO | Admitting: Family Medicine

## 2022-01-29 DIAGNOSIS — T839XXD Unspecified complication of genitourinary prosthetic device, implant and graft, subsequent encounter: Secondary | ICD-10-CM

## 2022-01-29 DIAGNOSIS — R339 Retention of urine, unspecified: Secondary | ICD-10-CM

## 2022-01-29 NOTE — Progress Notes (Signed)
Cath Change/ Replacement  Patient is present today for a catheter change due to urinary retention.  29m of water was removed from the balloon, a 20coude 3 way foley cath was removed without difficulty.  Patient was cleaned and prepped in a sterile fashion with betadine and 2% lidocaine jelly was instilled into the urethra. A 22 FR foley cath was replaced into the bladder, no complications were noted. Urine return was noted 122mand urine was yellow in color. The balloon was filled with 1064mf sterile water. A night bag was attached for drainage. Patient was given proper instruction on catheter care.    Performed by: CarElberta LeatherwoodMA, CryBradly BienenstockMA  Follow up: 1 month cath change

## 2022-01-30 ENCOUNTER — Other Ambulatory Visit: Payer: Self-pay

## 2022-01-30 ENCOUNTER — Ambulatory Visit (INDEPENDENT_AMBULATORY_CARE_PROVIDER_SITE_OTHER): Payer: HMO | Admitting: Physician Assistant

## 2022-01-30 ENCOUNTER — Emergency Department: Payer: HMO

## 2022-01-30 ENCOUNTER — Emergency Department
Admission: EM | Admit: 2022-01-30 | Discharge: 2022-01-30 | Disposition: A | Discharge status: Home / Self Care | Payer: HMO | Attending: Emergency Medicine | Admitting: Emergency Medicine

## 2022-01-30 ENCOUNTER — Emergency Department
Admission: EM | Admit: 2022-01-30 | Discharge: 2022-01-31 | Disposition: A | Discharge status: Home / Self Care | Payer: HMO | Source: Home / Self Care | Attending: Emergency Medicine | Admitting: Emergency Medicine

## 2022-01-30 DIAGNOSIS — T83091A Other mechanical complication of indwelling urethral catheter, initial encounter: Secondary | ICD-10-CM | POA: Insufficient documentation

## 2022-01-30 DIAGNOSIS — R339 Retention of urine, unspecified: Secondary | ICD-10-CM | POA: Insufficient documentation

## 2022-01-30 DIAGNOSIS — N390 Urinary tract infection, site not specified: Secondary | ICD-10-CM | POA: Insufficient documentation

## 2022-01-30 DIAGNOSIS — T839XXA Unspecified complication of genitourinary prosthetic device, implant and graft, initial encounter: Secondary | ICD-10-CM

## 2022-01-30 DIAGNOSIS — Y732 Prosthetic and other implants, materials and accessory gastroenterology and urology devices associated with adverse incidents: Secondary | ICD-10-CM | POA: Insufficient documentation

## 2022-01-30 DIAGNOSIS — L89150 Pressure ulcer of sacral region, unstageable: Secondary | ICD-10-CM

## 2022-01-30 DIAGNOSIS — R1030 Lower abdominal pain, unspecified: Secondary | ICD-10-CM | POA: Diagnosis not present

## 2022-01-30 DIAGNOSIS — N138 Other obstructive and reflux uropathy: Secondary | ICD-10-CM | POA: Diagnosis not present

## 2022-01-30 DIAGNOSIS — R109 Unspecified abdominal pain: Secondary | ICD-10-CM | POA: Insufficient documentation

## 2022-01-30 DIAGNOSIS — N401 Enlarged prostate with lower urinary tract symptoms: Secondary | ICD-10-CM | POA: Diagnosis not present

## 2022-01-30 DIAGNOSIS — R338 Other retention of urine: Secondary | ICD-10-CM

## 2022-01-30 MED ORDER — FENTANYL CITRATE PF 50 MCG/ML IJ SOSY
100.0000 ug | PREFILLED_SYRINGE | Freq: Once | INTRAMUSCULAR | Status: AC
  Administered 2022-01-30: 100 ug via INTRAVENOUS
  Filled 2022-01-30: qty 2

## 2022-01-30 MED ORDER — LIDOCAINE HCL URETHRAL/MUCOSAL 2 % EX GEL
1.0000 | Freq: Once | CUTANEOUS | Status: AC
  Administered 2022-01-30: 1 via URETHRAL
  Filled 2022-01-30 (×2): qty 10

## 2022-01-30 MED ORDER — ONDANSETRON HCL 4 MG/2ML IJ SOLN
4.0000 mg | Freq: Once | INTRAMUSCULAR | Status: AC
  Administered 2022-01-30: 4 mg via INTRAVENOUS
  Filled 2022-01-30: qty 2

## 2022-01-30 NOTE — ED Provider Notes (Signed)
   [facility] Provider Note    Event Date/Time   First MD Initiated Contact with Patient 01/30/22 1101     (approximate)   History   Urinary Retention   HPI  Justin Griffith is a 81 y.o. male with a history of urinary retention who has indwelling Foley catheter.  Patient reports it was changed at urology yesterday and since last night he was unable to pass urine through the catheter.  He complains of significant lower abdominal pain.     Physical Exam   Triage Vital Signs: ED Triage Vitals  Enc Vitals Group     BP 01/30/22 1109 (!) 222/146     Pulse Rate 01/30/22 1109 84     Resp 01/30/22 1109 18     Temp 01/30/22 1109 97.7 F (36.5 C)     Temp Source 01/30/22 1109 Oral     SpO2 01/30/22 1109 96 %     Weight 01/30/22 1104 103.1 kg (227 lb 4.7 oz)     Height 01/30/22 1104 1.803 m ('5\' 11"'$ )     Head Circumference --      Peak Flow --      Pain Score 01/30/22 1104 10     Pain Loc --      Pain Edu? --      Excl. in [place]? --     Most recent vital signs: Vitals:   01/30/22 1200 01/30/22 1215  BP: (!) 161/79 139/64  Pulse: 61 61  Resp:    Temp:    SpO2: 91% 92%     General: Awake, no distress.  CV:  Good peripheral perfusion.  Resp:  Normal effort.  Abd:  Distention of the bladder Other:  No urine in catheter bag   ED Results / Procedures / Treatments   Labs (all labs ordered are listed, but only abnormal results are displayed) Labs Reviewed - No data to display   EKG     RADIOLOGY     PROCEDURES:  Critical Care performed:   Procedures   MEDICATIONS ORDERED IN ED: Medications  lidocaine (XYLOCAINE) 2 % jelly 1 Application (1 Application Urethral Given 01/30/22 1130)     IMPRESSION / MDM / ASSESSMENT AND PLAN / ED COURSE  I reviewed the triage vital signs and the nursing notes. Patient's presentation is most consistent with exacerbation of chronic illness.   Patient presents with urinary retention, Foley  catheter not draining appropriately, just exchanged yesterday.  Apparently was able to empty bag last night.  No reports of bleeding.  We will remove catheter and insert new Foley.   RN inserted new foley, greater than 1 L yellow urine. Patient feeling much improved and anxious to leave asap. No indication for admission appropriate for d/c with urology follow up      FINAL CLINICAL IMPRESSION(S) / ED DIAGNOSES   Final diagnoses:  Urinary retention  Complication of Foley catheter, initial encounter ([place])     Rx / DC Orders   ED Discharge Orders     None        Note:  This document was prepared using Dragon voice recognition software and may include unintentional dictation errors.   Lavonia Drafts, MD 01/30/22 1410

## 2022-01-30 NOTE — ED Triage Notes (Addendum)
Pt reports he had a foley catheter replaced this morning and he states "It's not working anymore." Pt states they attempted to flush catheter today and "it didn't work." Pt AOX4, moaning and uncomfortable. Pt does not know why catheter was placed, states "I'm not a doctor I don't know why they put it in."

## 2022-01-30 NOTE — ED Provider Notes (Signed)
[facility] Provider Note  Patient Contact: 11:21 PM (approximate)   History   Urinary Retention (Foley problem)   HPI  Justin Griffith is a 81 y.o. male who presents the emergency department complaining of severe pelvic pain and urinary retention.  This is patient's third visit and 24 hours for urinary problems.  He is also urology, had Foley placed yesterday.  Had urinary retention this morning, presented to the emergency department and had his Foley replaced.  This occurred roughly at 2 PM.  Patient had over a liter of urine drained from his bladder.  Patient went home with indwelling Foley and then noted this evening as he started to develop some abdominal pain that he had not had any urinary output.  Patient states that the pain began very rapidly and was severe.  He is obviously uncomfortable at this time.  No fevers or chills.  No flank pain.  Patient has not seen any blood in the urine     Physical Exam   Triage Vital Signs: ED Triage Vitals  Enc Vitals Group     BP 01/30/22 2225 (!) 227/118     Pulse Rate 01/30/22 2225 99     Resp 01/30/22 2225 17     Temp 01/30/22 2225 97.6 F (36.4 C)     Temp Source 01/30/22 2225 Oral     SpO2 01/30/22 2225 96 %     Weight 01/30/22 2230 227 lb 1.2 oz (103 kg)     Height 01/30/22 2230 '5\' 11"'$  (1.803 m)     Head Circumference --      Peak Flow --      Pain Score 01/30/22 2230 10     Pain Loc --      Pain Edu? --      Excl. in [place]? --     Most recent vital signs: Vitals:   01/30/22 2225 01/31/22 0018  BP: (!) 227/118 (!) 214/92  Pulse: 99 68  Resp: 17 20  Temp: 97.6 F (36.4 C) 98.7 F (37.1 C)  SpO2: 96% 96%     General: Alert and in no acute distress.  Cardiovascular:  Good peripheral perfusion Respiratory: Normal respiratory effort without tachypnea or retractions. Lungs CTAB.  Gastrointestinal: Bowel sounds 4 quadrants.  Soft to palpation.  Patient is exquisitely tender in the suprapubic  region.. No guarding or rigidity. No palpable masses. No distention. No CVA tenderness Musculoskeletal: Full range of motion to all extremities.  Neurologic:  No gross focal neurologic deficits are appreciated.  Skin:   No rash noted Other:   ED Results / Procedures / Treatments   Labs (all labs ordered are listed, but only abnormal results are displayed) Labs Reviewed  URINE CULTURE  URINALYSIS, ROUTINE W REFLEX MICROSCOPIC  CBC WITH DIFFERENTIAL/PLATELET  CBC WITH DIFFERENTIAL/PLATELET  COMPREHENSIVE METABOLIC PANEL     EKG     RADIOLOGY    No results found.  PROCEDURES:  Critical Care performed: No  Ultrasound ED Abd  Date/Time: 01/30/2022 11:28 PM  Performed by: Darletta Moll, PA-C Authorized by: Darletta Moll, PA-C   Procedure details:    Indications: decreased urinary output     Assessment for:  Intra-abdominal fluid   Bladder:  Unable to visualize        Comments:     Point-of-care ultrasound performed due to decreased urinary output and unable to visualize bladder on bladder scanner.  There is an area of fluid identified, though this appears  higher than where his bladder should rest.  Unsure whether this is bladder or free fluid.  Deep in the pelvis and there is what appears to be balloon of the Foley cath next to seminal vesicles.  At this time unsure whether bladder is intact given position of Foley in regards to fluid-filled region.    MEDICATIONS ORDERED IN ED: Medications  fentaNYL (SUBLIMAZE) injection 100 mcg (100 mcg Intravenous Given 01/30/22 2330)  ondansetron (ZOFRAN) injection 4 mg (4 mg Intravenous Given 01/30/22 2333)     IMPRESSION / MDM / ASSESSMENT AND PLAN / ED COURSE  I reviewed the triage vital signs and the nursing notes.                              Differential diagnosis includes, but is not limited to, Foley catheter issue, urinary retention, UTI, bladder rupture  Patient's presentation is most  consistent with acute presentation with potential threat to life or bodily function.   Patient presented to the emergency department complaining of severe abdominal pain/pelvic pain and no urinary output.  Patient had been seen at urology, been subsequently at this department earlier today.  Patient had indwelling Foley cath for appears to be BPH.  He had urinary catheter issues earlier today, ended up having urinary retention.  Previous ED note reported that patient had over a liter of urine drained out of his bladder after urinary catheter replacement.  Patient was discharged, states that he went home, did not have any issues, took a nap but severe pain woke him up.  Patient states that he is having severe pelvic pain and appears very uncomfortable at this time.  Initially I ordered a bladder scan and urinary catheter flush.  Nursing staff was unable to obtain reading on bladder scanner and attempted a flush.  Catheter flushed very well, but there was no output flush or any urine.  As such I entered the room with point-of-care ultrasound and evaluated the patient's abdomen.  I was unable to visualize a bladder on the scan though there is a what appears to be air or fluid filled area measuring approximately 2 cm in his pelvis where his bladder should be.  Suspect that this is the balloon for the catheter.  My concern at this time is that the patient's bladder may have ruptured as I do not see a bladder on bladder scan or point-of-care ultrasound.  Patient is being sent to CT at this time.  He will be transferred to the major side emergency department at shift change for further care with Dr. Jacelyn Grip.  I have advised Dr. Jacelyn Grip of the patient's history, physical exam, point-of-care ultrasound findings.  Prior to transfer of care, I went with the patient to CT scan.  It appears that patient's bladder is grossly distended and is in fact intact.  Bladder is higher in the abdominal cavity and it appears that there is a  significantly enlarged prostate.  It appears that the balloon of the catheter is lodged within the prostate.  Correlation with bedside ultrasound reveals that this is the area visualized in regards to small area of air/fluid.  CT scan reveals bladder is sitting higher in the abdomen than the area evaluated at bedside with point-of-care ultrasound.  Patient care will still be transferred to Dr. Jacelyn Grip for ongoing management of urinary retention.  Note:  This document was prepared using Dragon voice recognition software and may include unintentional dictation errors.  Darletta Moll, PA-C 01/31/22 [id number]    Lucillie Garfinkel, MD 01/31/22 [phone]

## 2022-01-30 NOTE — Progress Notes (Unsigned)
Acute Office Visit   Patient: Justin Griffith   DOB: 12-Dec-1940   81 y.o. Male  MRN: [id number] Visit Date: 01/30/2022  Today's healthcare provider: Dani Gobble Theressa Piedra, PA-C  Introduced myself to the patient as a Journalist, newspaper and provided education on APPs in clinical practice.    Chief Complaint  Patient presents with   catheter pain    Reports catheter pain after going to Urology yesterday   Sore    Reports sore on bottom that was being treated in rehab    Subjective    HPI HPI     catheter pain    Additional comments: Reports catheter pain after going to Urology yesterday        Sore    Additional comments: Reports sore on bottom that was being treated in rehab       Last edited by Danija Gosa, Dani Gobble, PA-C on 01/30/2022 10:44 AM.       Catheter pain Patient states he went to urology yesterday and is in a great deal of pain and discomfort He states he did not get his bladder drained and is requesting we drain it and change it out today  He is reporting intense pain and groaning in wheelchair as well as adjusting position and rocking   Sore on buttocks Reports he was recently discharged from rehab (on Saturday?) and they were treating this  He is not sure which side it is on an denies home care since discharge- states he is waiting to get assistance from [name] for care       Medications: Outpatient Medications Prior to Visit  Medication Sig   acetaminophen (TYLENOL) 325 MG tablet Take 650 mg by mouth every 6 (six) hours as needed.   aluminum-magnesium hydroxide-simethicone (MAALOX) 761-950-93 MG/5ML SUSP Take 30 mLs by mouth 4 (four) times daily -  before meals and at bedtime.   apixaban (ELIQUIS) 2.5 MG TABS tablet Take 1 tablet (2.5 mg total) by mouth 2 (two) times daily.   ascorbic acid (VITAMIN C) 500 MG tablet Take 1 tablet (500 mg total) by mouth daily.   BREZTRI AEROSPHERE 160-9-4.8 MCG/ACT AERO Inhale 1 puff into the lungs daily.   carvedilol (COREG) 3.125 MG  tablet Take 1 tablet (3.125 mg total) by mouth 2 (two) times daily with a meal.   cloNIDine (CATAPRES) 0.1 MG tablet Take 0.1 mg by mouth 2 (two) times daily.   cyanocobalamin 1000 MCG tablet Take 1 tablet (1,000 mcg total) by mouth at bedtime.   feeding supplement, GLUCERNA SHAKE, (GLUCERNA SHAKE) LIQD Take 237 mLs by mouth 3 (three) times daily between meals.   gabapentin (NEURONTIN) 100 MG capsule Take 1 capsule (100 mg total) by mouth 3 (three) times daily. (Patient taking differently: Take 100 mg by mouth 2 (two) times daily.)   hydrALAZINE (APRESOLINE) 50 MG tablet TAKE ONE TABLET BY MOUTH THREE TIMES A DAY FOR BLOOD PRESSURE   insulin aspart (NOVOLOG) 100 UNIT/ML injection For glucose 121-150 give 1 unit subcutaneous insulin; 151- 200: 2 units; 201 to 250: 3 units; 251-300: 5 units; 301 to 350: 7 units; 351 and above 9 units.   insulin glargine-yfgn (SEMGLEE) 100 UNIT/ML Pen Inject 5 Units into the skin at bedtime.   magnesium oxide (MAG-OX) 400 (240 Mg) MG tablet Take 1 tablet (400 mg total) by mouth daily.   Multiple Vitamin (MULTIVITAMIN WITH MINERALS) TABS tablet Take 1 tablet by mouth daily.   pantoprazole (PROTONIX) 40  MG tablet Take 40 mg by mouth daily.   rosuvastatin (CRESTOR) 10 MG tablet Take 1 tablet (10 mg total) by mouth at bedtime.   Tiotropium Bromide Monohydrate (SPIRIVA RESPIMAT) 2.5 MCG/ACT AERS Inhale 2 puffs into the lungs daily.   Vitamin D, Cholecalciferol, 25 MCG (1000 UT) CAPS Take 2,000 mcg by mouth at bedtime.   No facility-administered medications prior to visit.    Review of Systems  Genitourinary:        Catheter pain and discomfort    Skin:  Positive for wound.       Reports pressure wound on buttocks        Objective    SpO2 99%    Physical Exam Vitals reviewed.  Constitutional:      General: He is awake. He is in acute distress.     Appearance: He is ill-appearing.  Skin:    General: Skin is warm.          Comments: Pressure wound  present along left side of gluteal cleft  Appears to be approx 5 cm in diameter but patient was not able to remain standing long enough for detailed review and examination of wound. After brief limited exam it appears to be likely stage 1-2 wound of superficial dermal layers       Results for orders placed or performed during the hospital encounter of 01/30/22  Urine Culture   Specimen: Urine, Catheterized  Result Value Ref Range   Specimen Description      URINE, CATHETERIZED Performed at [facility], [address], [place], [place] [address]    Special Requests      NONE Performed at [facility], [address] [place], [place] [address]    Culture (A)     >=100,000 COLONIES/mL ENTEROBACTER AEROGENES REPEATING SUSCEPTIBILITY Performed at [facility], [place] [address] [place], [place] [address]    Report Status PENDING   Urinalysis, Routine w reflex microscopic Urine, Catheterized  Result Value Ref Range   Color, Urine YELLOW (A) YELLOW   APPearance HAZY (A) CLEAR   Specific Gravity, Urine 1.012 1.005 - 1.030   pH 7.0 5.0 - 8.0   Glucose, UA 50 (A) NEGATIVE mg/dL   Hgb urine dipstick MODERATE (A) NEGATIVE   Bilirubin Urine NEGATIVE NEGATIVE   Ketones, ur NEGATIVE NEGATIVE mg/dL   Protein, ur >=300 (A) NEGATIVE mg/dL   Nitrite POSITIVE (A) NEGATIVE   Leukocytes,Ua SMALL (A) NEGATIVE   RBC / HPF >50 (H) 0 - 5 RBC/hpf   WBC, UA >50 (H) 0 - 5 WBC/hpf   Bacteria, UA MANY (A) NONE SEEN   Squamous Epithelial / LPF NONE SEEN 0 - 5   Mucus PRESENT   CBC with Differential/Platelet  Result Value Ref Range   WBC 8.3 4.0 - 10.5 K/uL   RBC 4.04 (L) 4.22 - 5.81 MIL/uL   Hemoglobin 11.7 (L) 13.0 - 17.0 g/dL   HCT 35.7 (L) 39.0 - 52.0 %   MCV 88.4 80.0 - 100.0 fL   MCH 29.0 26.0 - 34.0 pg   MCHC 32.8 30.0 - 36.0 g/dL   RDW 14.2 11.5 - 15.5 %   Platelets 276 150 - 400 K/uL   nRBC 0.0 0.0 - 0.2 %   Neutrophils Relative % 73 %   Neutro Abs 6.0 1.7 -  7.7 K/uL   Lymphocytes Relative 15 %   Lymphs Abs 1.3 0.7 - 4.0 K/uL   Monocytes Relative 10 %  Monocytes Absolute 0.8 0.1 - 1.0 K/uL   Eosinophils Relative 1 %   Eosinophils Absolute 0.1 0.0 - 0.5 K/uL   Basophils Relative 1 %   Basophils Absolute 0.1 0.0 - 0.1 K/uL   Immature Granulocytes 0 %   Abs Immature Granulocytes 0.03 0.00 - 0.07 K/uL  Comprehensive metabolic panel  Result Value Ref Range   Sodium 142 135 - 145 mmol/L   Potassium 3.1 (L) 3.5 - 5.1 mmol/L   Chloride 108 98 - 111 mmol/L   CO2 29 22 - 32 mmol/L   Glucose, Bld 157 (H) 70 - 99 mg/dL   BUN 14 8 - 23 mg/dL   Creatinine, Ser 1.06 0.61 - 1.24 mg/dL   Calcium 9.5 8.9 - 10.3 mg/dL   Total Protein 6.0 (L) 6.5 - 8.1 g/dL   Albumin 2.8 (L) 3.5 - 5.0 g/dL   AST 14 (L) 15 - 41 U/L   ALT 8 0 - 44 U/L   Alkaline Phosphatase 47 38 - 126 U/L   Total Bilirubin 0.5 0.3 - 1.2 mg/dL   GFR, Estimated >60 >60 mL/min   Anion gap 5 5 - 15    Assessment & Plan        Problem List Items Addressed This Visit       Genitourinary   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         Other   Pressure injury of sacral region, unstageable ([place])    Unsure of chronicity at this time, ongoing issue Exam was limited due to patient's catheter pain and he was not able to provide much in terms of HPI regarding this PE revealed a likely stage 1-2 pressure injury to left gluteal cleft  Offered to  place referral to Wound care as I am unsure if he has home health services or other care at home but patient declined Recommend he return to the office for evaluation when he is not longer distracted with catheter concerns for more in depth exam and discussion of management plan         Return in about 4 weeks (around 02/27/2022) for Chronic condition follow up with PCP .   I, Rozetta Stumpp E Deagen Krass, PA-C, have reviewed all documentation for this visit. The documentation on 02/02/22 for the exam, diagnosis, procedures, and orders are all accurate and complete.   Talitha Givens, MHS, PA-C [facility]

## 2022-01-30 NOTE — ED Notes (Signed)
Pt wheelchair to looby.   Demonstrated and discussed how to take care of foley at home, how to empty and return precautions. VSS at discharge. All questions answered.

## 2022-01-30 NOTE — ED Triage Notes (Signed)
Pt here with urinary catheter problems. Pt states he had a new urinary catheter placed yesterday and has not been able to urinate and is having severe pain. Pt moaning in triage.

## 2022-01-30 NOTE — Assessment & Plan Note (Signed)
Unsure of chronicity at this time, ongoing issue Exam was limited due to patient's catheter pain and he was not able to provide much in terms of HPI regarding this PE revealed a likely stage 1-2 pressure injury to left gluteal cleft  Offered to place referral to Wound care as I am unsure if he has home health services or other care at home but patient declined Recommend he return to the office for evaluation when he is not longer distracted with catheter concerns for more in depth exam and discussion of management plan

## 2022-01-30 NOTE — Assessment & Plan Note (Signed)
Chronic, ongoing Patient presented today with concerns of pain and extreme discomfort following catheter placement yesterday at Urology States he does not feel as though his bladder is being drained and is in severe pain After discussing with him that we are unable to replace or manipulate Foley catheters in this office and he should either return to Urology or go to ED, he elected to go to ED for management Recommend he return to Urology for further concerns regarding his catheter and prostate issues.

## 2022-01-31 DIAGNOSIS — R339 Retention of urine, unspecified: Secondary | ICD-10-CM | POA: Diagnosis not present

## 2022-01-31 LAB — CBC WITH DIFFERENTIAL/PLATELET
Abs Immature Granulocytes: 0.03 10*3/uL (ref 0.00–0.07)
Basophils Absolute: 0.1 10*3/uL (ref 0.0–0.1)
Basophils Relative: 1 %
Eosinophils Absolute: 0.1 10*3/uL (ref 0.0–0.5)
Eosinophils Relative: 1 %
HCT: 35.7 % — ABNORMAL LOW (ref 39.0–52.0)
Hemoglobin: 11.7 g/dL — ABNORMAL LOW (ref 13.0–17.0)
Immature Granulocytes: 0 %
Lymphocytes Relative: 15 %
Lymphs Abs: 1.3 10*3/uL (ref 0.7–4.0)
MCH: 29 pg (ref 26.0–34.0)
MCHC: 32.8 g/dL (ref 30.0–36.0)
MCV: 88.4 fL (ref 80.0–100.0)
Monocytes Absolute: 0.8 10*3/uL (ref 0.1–1.0)
Monocytes Relative: 10 %
Neutro Abs: 6 10*3/uL (ref 1.7–7.7)
Neutrophils Relative %: 73 %
Platelets: 276 10*3/uL (ref 150–400)
RBC: 4.04 MIL/uL — ABNORMAL LOW (ref 4.22–5.81)
RDW: 14.2 % (ref 11.5–15.5)
WBC: 8.3 10*3/uL (ref 4.0–10.5)
nRBC: 0 % (ref 0.0–0.2)

## 2022-01-31 LAB — URINALYSIS, ROUTINE W REFLEX MICROSCOPIC
Bilirubin Urine: NEGATIVE
Glucose, UA: 50 mg/dL — AB
Ketones, ur: NEGATIVE mg/dL
Nitrite: POSITIVE — AB
Protein, ur: >=300 mg/dL — AB
RBC / HPF: >50 RBC/hpf — ABNORMAL HIGH (ref 0–5)
Specific Gravity, Urine: 1.012 (ref 1.005–1.030)
Squamous Epithelial / HPF: NONE SEEN (ref 0–5)
WBC, UA: >50 WBC/hpf — ABNORMAL HIGH (ref 0–5)
pH: 7 (ref 5.0–8.0)

## 2022-01-31 LAB — COMPREHENSIVE METABOLIC PANEL
ALT: 8 U/L (ref 0–44)
AST: 14 U/L — ABNORMAL LOW (ref 15–41)
Albumin: 2.8 g/dL — ABNORMAL LOW (ref 3.5–5.0)
Alkaline Phosphatase: 47 U/L (ref 38–126)
Anion gap: 5 (ref 5–15)
BUN: 14 mg/dL (ref 8–23)
CO2: 29 mmol/L (ref 22–32)
Calcium: 9.5 mg/dL (ref 8.9–10.3)
Chloride: 108 mmol/L (ref 98–111)
Creatinine, Ser: 1.06 mg/dL (ref 0.61–1.24)
GFR, Estimated: >60 mL/min (ref >60)
Glucose, Bld: 157 mg/dL — ABNORMAL HIGH (ref 70–99)
Potassium: 3.1 mmol/L — ABNORMAL LOW (ref 3.5–5.1)
Sodium: 142 mmol/L (ref 135–145)
Total Bilirubin: 0.5 mg/dL (ref 0.3–1.2)
Total Protein: 6 g/dL — ABNORMAL LOW (ref 6.5–8.1)

## 2022-01-31 MED ORDER — NITROFURANTOIN MONOHYD MACRO 100 MG PO CAPS: 100.0000 mg | ORAL_CAPSULE | Freq: Two times a day (BID) | ORAL | 0 refills | Status: DC

## 2022-01-31 MED ORDER — NITROFURANTOIN MONOHYD MACRO 100 MG PO CAPS
100.0000 mg | ORAL_CAPSULE | Freq: Once | ORAL | Status: AC
  Administered 2022-01-31: 100 mg via ORAL
  Filled 2022-01-31: qty 1

## 2022-01-31 NOTE — ED Notes (Signed)
Flushed foley catheter with 50cc NS with no resistance. No urinary return after flush. Pt continues to complain of severe abdominal pain. Provider notified.

## 2022-01-31 NOTE — ED Provider Notes (Addendum)
Patient is chronic indwelling Foley catheter which is malpositioned leading to acute retention will attempt to replace in the emergency department check basic labs; pt is eager to home once foley placed and understands we should check kidney function as well.   Nitrite positive urine previous Ucx multidrug resistant limits abx choice but sensitive for macrobid; will give first dose and rx.     Lucillie Garfinkel, MD 01/31/22 Lupita Shutter    Lucillie Garfinkel, MD 01/31/22 [phone]

## 2022-01-31 NOTE — Discharge Instructions (Addendum)
See your urologist this week for a follow-up appointment. Take Antibiotics as prescribed.  Drink plenty of fluids to stay well-hydrated.  Thank you for choosing [place] for your health care today!  Please see your primary doctor this week for a follow up appointment.   If you do not have a primary doctor call the following clinics to establish care:  If you have insurance:  [facility] [phone] [place] [address]   [facility]  [phone] [address] [place] [address]   If you do not have insurance:  [facility]  [phone] [address], [place] [address]  Sometimes, in the early stages of certain disease courses it is difficult to detect in the emergency department evaluation -- so, it is important that you continue to monitor your symptoms and call your doctor right away or return to the emergency department if you develop any new or worsening symptoms.  It was my pleasure to care for you today.   Hoover Brunette Jacelyn Grip, MD

## 2022-02-01 ENCOUNTER — Other Ambulatory Visit: Payer: Self-pay

## 2022-02-01 DIAGNOSIS — R339 Retention of urine, unspecified: Secondary | ICD-10-CM | POA: Insufficient documentation

## 2022-02-01 DIAGNOSIS — J439 Emphysema, unspecified: Secondary | ICD-10-CM | POA: Insufficient documentation

## 2022-02-01 DIAGNOSIS — N1832 Chronic kidney disease, stage 3b: Secondary | ICD-10-CM | POA: Insufficient documentation

## 2022-02-01 DIAGNOSIS — E785 Hyperlipidemia, unspecified: Secondary | ICD-10-CM | POA: Diagnosis not present

## 2022-02-01 DIAGNOSIS — E1122 Type 2 diabetes mellitus with diabetic chronic kidney disease: Secondary | ICD-10-CM | POA: Insufficient documentation

## 2022-02-01 DIAGNOSIS — Z85038 Personal history of other malignant neoplasm of large intestine: Secondary | ICD-10-CM | POA: Diagnosis not present

## 2022-02-01 DIAGNOSIS — Z87891 Personal history of nicotine dependence: Secondary | ICD-10-CM | POA: Diagnosis not present

## 2022-02-01 DIAGNOSIS — I130 Hypertensive heart and chronic kidney disease with heart failure and stage 1 through stage 4 chronic kidney disease, or unspecified chronic kidney disease: Secondary | ICD-10-CM | POA: Insufficient documentation

## 2022-02-01 DIAGNOSIS — I2510 Atherosclerotic heart disease of native coronary artery without angina pectoris: Secondary | ICD-10-CM | POA: Diagnosis not present

## 2022-02-01 DIAGNOSIS — T83098A Other mechanical complication of other indwelling urethral catheter, initial encounter: Secondary | ICD-10-CM | POA: Diagnosis present

## 2022-02-01 DIAGNOSIS — I5032 Chronic diastolic (congestive) heart failure: Secondary | ICD-10-CM | POA: Diagnosis not present

## 2022-02-01 DIAGNOSIS — Y738 Miscellaneous gastroenterology and urology devices associated with adverse incidents, not elsewhere classified: Secondary | ICD-10-CM | POA: Diagnosis not present

## 2022-02-01 NOTE — ED Triage Notes (Signed)
Patient arrives with complaints of issues with his foley catheter. Patient states he is having pain at the insertion site of the foley catheter and the foley is no longer draining. Patient states he is scheduled to see the urology clinic tomorrow.

## 2022-02-02 ENCOUNTER — Emergency Department
Admission: EM | Admit: 2022-02-02 | Discharge: 2022-02-02 | Disposition: A | Discharge status: Home / Self Care | Payer: HMO | Source: Home / Self Care | Attending: Emergency Medicine | Admitting: Emergency Medicine

## 2022-02-02 ENCOUNTER — Encounter: Payer: Self-pay | Admitting: Emergency Medicine

## 2022-02-02 ENCOUNTER — Telehealth: Payer: Self-pay

## 2022-02-02 ENCOUNTER — Emergency Department
Admission: EM | Admit: 2022-02-02 | Discharge: 2022-02-02 | Disposition: A | Discharge status: Home / Self Care | Payer: HMO | Attending: Emergency Medicine | Admitting: Emergency Medicine

## 2022-02-02 DIAGNOSIS — Z87891 Personal history of nicotine dependence: Secondary | ICD-10-CM | POA: Insufficient documentation

## 2022-02-02 DIAGNOSIS — J439 Emphysema, unspecified: Secondary | ICD-10-CM | POA: Insufficient documentation

## 2022-02-02 DIAGNOSIS — E785 Hyperlipidemia, unspecified: Secondary | ICD-10-CM | POA: Insufficient documentation

## 2022-02-02 DIAGNOSIS — I2510 Atherosclerotic heart disease of native coronary artery without angina pectoris: Secondary | ICD-10-CM | POA: Insufficient documentation

## 2022-02-02 DIAGNOSIS — J449 Chronic obstructive pulmonary disease, unspecified: Secondary | ICD-10-CM | POA: Insufficient documentation

## 2022-02-02 DIAGNOSIS — T839XXA Unspecified complication of genitourinary prosthetic device, implant and graft, initial encounter: Secondary | ICD-10-CM

## 2022-02-02 DIAGNOSIS — E1169 Type 2 diabetes mellitus with other specified complication: Secondary | ICD-10-CM | POA: Insufficient documentation

## 2022-02-02 DIAGNOSIS — I130 Hypertensive heart and chronic kidney disease with heart failure and stage 1 through stage 4 chronic kidney disease, or unspecified chronic kidney disease: Secondary | ICD-10-CM | POA: Insufficient documentation

## 2022-02-02 DIAGNOSIS — E1122 Type 2 diabetes mellitus with diabetic chronic kidney disease: Secondary | ICD-10-CM | POA: Insufficient documentation

## 2022-02-02 DIAGNOSIS — N1832 Chronic kidney disease, stage 3b: Secondary | ICD-10-CM | POA: Insufficient documentation

## 2022-02-02 DIAGNOSIS — T83098A Other mechanical complication of other indwelling urethral catheter, initial encounter: Secondary | ICD-10-CM | POA: Diagnosis not present

## 2022-02-02 DIAGNOSIS — I5032 Chronic diastolic (congestive) heart failure: Secondary | ICD-10-CM | POA: Insufficient documentation

## 2022-02-02 DIAGNOSIS — R339 Retention of urine, unspecified: Secondary | ICD-10-CM

## 2022-02-02 DIAGNOSIS — Z85038 Personal history of other malignant neoplasm of large intestine: Secondary | ICD-10-CM | POA: Insufficient documentation

## 2022-02-02 DIAGNOSIS — J45909 Unspecified asthma, uncomplicated: Secondary | ICD-10-CM | POA: Insufficient documentation

## 2022-02-02 LAB — BASIC METABOLIC PANEL
Anion gap: 7 (ref 5–15)
BUN: 13 mg/dL (ref 8–23)
CO2: 25 mmol/L (ref 22–32)
Calcium: 9.8 mg/dL (ref 8.9–10.3)
Chloride: 106 mmol/L (ref 98–111)
Creatinine, Ser: 1.07 mg/dL (ref 0.61–1.24)
GFR, Estimated: >60 mL/min (ref >60)
Glucose, Bld: 209 mg/dL — ABNORMAL HIGH (ref 70–99)
Potassium: 3.1 mmol/L — ABNORMAL LOW (ref 3.5–5.1)
Sodium: 138 mmol/L (ref 135–145)

## 2022-02-02 MED ORDER — NITROFURANTOIN MONOHYD MACRO 100 MG PO CAPS: 100.0000 mg | ORAL_CAPSULE | Freq: Once | ORAL | Status: DC

## 2022-02-02 MED ORDER — CEPHALEXIN 500 MG PO CAPS: 500.0000 mg | ORAL_CAPSULE | Freq: Four times a day (QID) | ORAL | 0 refills | Status: DC

## 2022-02-02 MED ORDER — LIDOCAINE HCL URETHRAL/MUCOSAL 2 % EX GEL
1.0000 | Freq: Once | CUTANEOUS | Status: AC
  Administered 2022-02-02: 1 via URETHRAL
  Filled 2022-02-02: qty 10

## 2022-02-02 MED ORDER — SODIUM CHLORIDE 0.9 % IV SOLN
1.0000 g | Freq: Once | INTRAVENOUS | Status: DC
  Filled 2022-02-02: qty 10

## 2022-02-02 MED ORDER — CEFTRIAXONE SODIUM 1 G IJ SOLR
1.0000 g | Freq: Once | INTRAMUSCULAR | Status: AC
  Administered 2022-02-02: 1 g via INTRAMUSCULAR
  Filled 2022-02-02 (×2): qty 10

## 2022-02-02 NOTE — ED Notes (Signed)
Pen pad not working in room. Patient verbalized understanding of discharge. No questions for RN at this time.

## 2022-02-02 NOTE — ED Provider Notes (Signed)
[facility] Provider Note    Event Date/Time   First MD Initiated Contact with Patient 02/02/22 [phone]     (approximate)   History   Urinary Retention  HPI  Justin Griffith is a 81 y.o. male   Past medical history of chronic indwelling Foley catheter who presents to the emergency department with suprapubic discomfort and no output from his Foley catheter for the past several hours.  He was in the emergency department for the last couple of days with similar problems requiring an exchange in his Foley which relieved pain and had large urine output.  He has no other complaints at this time.  History was obtained via the patient. Independent historian was interviewed including his wife who provides collateral information Review of external medical notes including emergency department yesterday 02/01/2022 for urinary retention and urinary tract infection.      Physical Exam   Triage Vital Signs: ED Triage Vitals [02/01/22 2341]  Enc Vitals Group     BP (!) 192/170     Pulse Rate 94     Resp 18     Temp 99 F (37.2 C)     Temp Source Oral     SpO2 97 %     Weight 227 lb 1.2 oz (103 kg)     Height '5\' 11"'$  (1.803 m)     Head Circumference      Peak Flow      Pain Score 10     Pain Loc      Pain Edu?      Excl. in [place]?     Most recent vital signs: Vitals:   02/01/22 2341 02/02/22 0117  BP: (!) 192/170 (!) 168/117  Pulse: 94 70  Resp: 18 20  Temp: 99 F (37.2 C)   SpO2: 97% 96%    General: Awake, no distress.  CV:  Good peripheral perfusion.  Resp:  Normal effort.  Abd:  No distention.  tender in the suprapubic area Other:  Awake alert pleasant cooperative   ED Results / Procedures / Treatments   Labs (all labs ordered are listed, but only abnormal results are displayed) Labs Reviewed  BASIC METABOLIC PANEL - Abnormal; Notable for the following components:      Result Value   Potassium 3.1 (*)    Glucose, Bld 209 (*)    All other  components within normal limits     I reviewed labs and they are notable for potassium 3.1, same as yesterday, a creatinine of 1.07  PROCEDURES:  Critical Care performed: No  Procedures   MEDICATIONS ORDERED IN ED: Medications  cefTRIAXone (ROCEPHIN) injection 1 g (has no administration in time range)    IMPRESSION / MDM / ASSESSMENT AND PLAN / ED COURSE  I reviewed the triage vital signs and the nursing notes.                              Differential diagnosis includes, but is not limited to, Foley obstruction, urinary retention, obstructive uropathy, kidney failure, urinary tract infection, considered but less likely intra-abdominal infection or surgical abdominal pathologies    MDM: Patient with a urinalysis reviewed from yesterday's emergency department visit that was nitrite positive indicative of urinary tract infection.  Multiple obstruction studies Foley catheter requiring exchange, exchanged today with good output and relief of symptoms completely.  He did not pick up his prescription yesterday and did not start  his antibiotics, I will give him a shot of ceftriaxone today and advised that he take antibiotics as prescribed and follow-up with urologist first thing in the morning tomorrow.    I considered admission with consultation to urology while inpatient given the multiple obstructions in the past several days, but the patient declined stating that he feels well now and would rather go home and follow-up worsening in the morning.  Patient's presentation is most consistent with acute presentation with potential threat to life or bodily function.       FINAL CLINICAL IMPRESSION(S) / ED DIAGNOSES   Final diagnoses:  Complication of Foley catheter, initial encounter [facility])  Urinary retention     Rx / DC Orders   ED Discharge Orders          Ordered    cephALEXin (KEFLEX) 500 MG capsule  4 times daily        02/02/22 0128             Note:  This  document was prepared using Dragon voice recognition software and may include unintentional dictation errors.    Lucillie Garfinkel, MD 02/02/22 [phone]

## 2022-02-02 NOTE — Discharge Instructions (Signed)
Pick up your prescription for antibiotics at the pharmacy and take as prescribed.  Call your urologist for a follow-up appointment today.  Thank you for choosing [place] for your health care today!  Please see your primary doctor this week for a follow up appointment.   If you do not have a primary doctor call the following clinics to establish care:  If you have insurance:  [facility] [phone] [place] [address]   [facility]  [phone] [address] [place] [address]   If you do not have insurance:  [facility]  [phone] [address] [place] [address]  Sometimes, in the early stages of certain disease courses it is difficult to detect in the emergency department evaluation -- so, it is important that you continue to monitor your symptoms and call your doctor right away or return to the emergency department if you develop any new or worsening symptoms.  It was my pleasure to care for you today.   Hoover Brunette Jacelyn Grip, MD

## 2022-02-02 NOTE — Telephone Encounter (Signed)
Patient Justin Griffith this morning. He was seen in ER on Friday; also seen here last week. He is having issues with his catheter and was advised by er to call our office. His number is [phone] - Per Caryl Pina via secure chat.    Called pt n/s. Per Epic he is currently in the ED.

## 2022-02-02 NOTE — ED Notes (Signed)
New Foley reinserted d/t manufacturer defect/pinprick in line not allowing balloon to fill.

## 2022-02-02 NOTE — ED Provider Notes (Signed)
[facility] Provider Note    Event Date/Time   First MD Initiated Contact with Patient 02/02/22 1000     (approximate)   History   Urinary Retention   HPI  CLEMENS LACHMAN is a 81 y.o. male with past medical history of indwelling Foley who presents because of urinary retention.  Patient has had multiple visits recently for obstructed and nonfunctioning Foley catheter with urinary retention.  Last seen in the ED around 10 PM last night.  Foley catheter was replaced and had good urine output.  Patient tells me that he never really urinated after the Foley catheter was placed in the ED.  He has significant suprapubic discomfort.  No fevers chills, no back pain.  He is not aware of any hematuria.  Patient has been prescribed antibiotics for UTI.  Received a dose of Rocephin in the ED last night.  Urine culture is growing Enterobacter, sensitivities are pending.  BMP from last night did not show any AKI.    Past Medical History:  Diagnosis Date   Acute urinary retention    Arthritis    Asthma 2000   Back pain    Colon polyp 2012   COPD (chronic obstructive pulmonary disease) (HCC)    Emphysema of lung ([place])    Heart murmur    Hernia 2000   Hyperlipidemia    Hypertension    Personal history of malignant neoplasm of large intestine    Personal history of tobacco use, presenting hazards to health    Sleep apnea    Special screening for malignant neoplasms, colon    Type 2 diabetes mellitus with proteinuria ([place]) 10/11/2014    Patient Active Problem List   Diagnosis Date Noted   Unable to ambulate 12/05/2021   Lower abdominal pain 11/23/2021   Pressure injury of sacral region, unstageable ([place])    Septic shock ([place]) 11/18/2021   Lobar pneumonia ([place]) 11/18/2021   Acute kidney injury superimposed on chronic kidney disease ([place]) 11/18/2021   Type 2 diabetes mellitus with chronic kidney disease, without long-term current use of insulin ([place]) 11/18/2021    Hyperkalemia    Recurrent falls 10/31/2021   Stroke ([place]) 10/31/2021   HLD (hyperlipidemia) 10/31/2021   Type II diabetes mellitus with renal manifestations ([place]) 10/31/2021   CAD (coronary artery disease) 10/31/2021   Chronic diastolic CHF (congestive heart failure) ([place]) 10/31/2021   Right elbow pain 10/15/2021   Gross hematuria 10/12/2021   Chronic anticoagulation 10/12/2021   Stage 3b chronic kidney disease (CKD) ([place]) 10/12/2021   History of pulmonary embolism 09/18/21 10/06/2021   Essential hypertension 09/19/2021   Dyslipidemia 09/19/2021   Generalized weakness 06/25/2021   Lymphedema 03/17/2021   Headache disorder 02/27/2021   Falls frequently 11/28/2020   Aortic atherosclerosis ([place]) 06/12/2020   History of embolic stroke 60/73/7106   At high risk for falls 05/08/2020   History of cold sores 05/06/2020   Hyponatremia 03/29/2020   History of non-ST elevation myocardial infarction (NSTEMI) 03/28/2020   Elevated TSH 02/25/2020   Poor mobility 02/02/2020   Coronary artery disease 01/23/2020   History of CVA (cerebrovascular accident) 01/23/2020   Memory changes 01/01/2020   Chronic venous insufficiency 05/23/2019   B12 deficiency 04/23/2019   Constipation 04/21/2019   Tremor 11/14/2018   Obesity (BMI 30-39.9) 09/07/2017   Chronic obstructive pulmonary disease (COPD) ([place]) 04/29/2017   Intertrigo 02/02/2017   BPH with obstruction/lower urinary tract symptoms Foley dependent 07/23/2016   Advanced care planning/counseling discussion 07/23/2016  Eosinophilic esophagitis 17/61/6073   PAD (peripheral artery disease) ([place]) 01/08/2016   GERD without esophagitis 01/06/2016   Hyperlipidemia associated with type 2 diabetes mellitus ([place]) 01/16/2015   Sleep apnea 01/16/2015   BMI 40.0-44.9, adult ([place]) 01/16/2015   Hypertension associated with diabetes ([place]) 10/11/2014   Type 2 diabetes mellitus with proteinuria ([place]) 10/11/2014   Back pain 10/11/2014   History of colon cancer       Physical Exam  Triage Vital Signs: ED Triage Vitals  Enc Vitals Group     BP 02/02/22 0901 (S) (!) 210/130     Pulse Rate 02/02/22 0901 95     Resp 02/02/22 0859 16     Temp 02/02/22 0859 98.3 F (36.8 C)     Temp Source 02/02/22 0859 Oral     SpO2 02/02/22 0859 97 %     Weight --      Height --      Head Circumference --      Peak Flow --      Pain Score 02/02/22 0859 10     Pain Loc --      Pain Edu? --      Excl. in [place]? --     Most recent vital signs: Vitals:   02/02/22 1006 02/02/22 1109  BP: (!) 227/130 (!) 169/88  Pulse: 90 66  Resp: 18 18  Temp:    SpO2: 97% 98%     General: Awake, no distress.  CV:  Good peripheral perfusion.  Resp:  Normal effort.  Abd:  No distention. + Suprapubic tenderness Neuro:             Awake, Alert, Oriented x 3  Other:     ED Results / Procedures / Treatments  Labs (all labs ordered are listed, but only abnormal results are displayed) Labs Reviewed - No data to display   EKG     RADIOLOGY  I performed interpreted bedside ultrasound of the bladder which shows distended bladder, Foley balloon in place  PROCEDURES:  Critical Care performed: No  Procedures  The patient is on the cardiac monitor to evaluate for evidence of arrhythmia and/or significant heart rate changes.   MEDICATIONS ORDERED IN ED: Medications - No data to display   IMPRESSION / MDM / [place] / ED COURSE  I reviewed the triage vital signs and the nursing notes.                              Patient's presentation is most consistent with acute complicated illness / injury requiring diagnostic workup.  Differential diagnosis includes, but is not limited to, clogged catheter from clot or sediment, displaced catheter  Patient is an 81 year old male with history of urinary tension with multiple recent visits for obstructed Foley catheter presents because his catheter that was placed just about 12 hours earlier is no longer  draining.  He has suprapubic pain.  On arrival he is quite hypertensive I suspect his pain related.  The Foley bag has no urine in it. I attempted to flush the catheter several times myself but it was not able to draw back.  No clot was ever flushed out so the Foley catheter was replaced.  Per nursing there was a clot at the end of the existing catheter.  68 [place] three-way catheter was placed with return of over a liter of clear urine.  No significant hematuria.  Patient asymptomatic.  Did  discuss with him the importance of making sure the bag does not get pulled as he did not want to have a leg bag he wanted to back that he can hold.  Had renal function checked just several hours ago's do not feel need to repeat.  Recommended urology follow-up.  I did discuss that if catheter stops draining at any point he does need to return to the ED.       FINAL CLINICAL IMPRESSION(S) / ED DIAGNOSES   Final diagnoses:  Urinary retention     Rx / DC Orders   ED Discharge Orders     None        Note:  This document was prepared using Dragon voice recognition software and may include unintentional dictation errors.   Rada Hay, MD 02/02/22 1153

## 2022-02-02 NOTE — Discharge Instructions (Signed)
Please follow-up with urology.  If your catheter stops draining please return to the emergency department.

## 2022-02-02 NOTE — ED Notes (Signed)
Pt unable to sign D/C d/t pin pad malfunctions.  Pt verbally stated understanding of all D/C instructions.

## 2022-02-02 NOTE — ED Notes (Signed)
Attempted to bladder scan the patient. Bladder scan showed 67m on multiple attempts. Patient states the same occurred at his last visit, however when they inserted the foley catheter, lots of urine was drained from his bladder.

## 2022-02-02 NOTE — ED Triage Notes (Signed)
Pt to ED via POV. Pt states that he had a new foley placed last night due to pain and his foley not draining. Pt states that he still has not had any output from his foley. Foley appears to be clogged with a blood clot. Pt states that he is having pain from not passing urine.

## 2022-02-03 ENCOUNTER — Telehealth: Payer: Self-pay | Admitting: Family Medicine

## 2022-02-03 LAB — URINE CULTURE: Culture: >=100000 — AB

## 2022-02-03 NOTE — Telephone Encounter (Signed)
Copied from [place] [phone]. Topic: General - Other >> Feb 03, 2022  1:43 PM Justin Griffith wrote: Reason for CRM: Justin Griffith with Justin Griffith has called to request that orders for PT be submitted for the patient   Justin Griffith has been made aware that the patient has an upcoming appointment and would like to ensure that physical therapy is discussed with the patient during their visit on 02/04/22  Please contact further when possible

## 2022-02-04 ENCOUNTER — Ambulatory Visit (INDEPENDENT_AMBULATORY_CARE_PROVIDER_SITE_OTHER): Payer: HMO | Admitting: Family Medicine

## 2022-02-04 ENCOUNTER — Encounter: Payer: Self-pay | Admitting: Family Medicine

## 2022-02-04 VITALS — BP 209/78 | HR 97 | Temp 98.1°F | Ht 71.0 in | Wt 227.0 lb

## 2022-02-04 DIAGNOSIS — N401 Enlarged prostate with lower urinary tract symptoms: Secondary | ICD-10-CM

## 2022-02-04 DIAGNOSIS — I2510 Atherosclerotic heart disease of native coronary artery without angina pectoris: Secondary | ICD-10-CM

## 2022-02-04 DIAGNOSIS — Z0000 Encounter for general adult medical examination without abnormal findings: Secondary | ICD-10-CM

## 2022-02-04 DIAGNOSIS — I700 Atherosclerosis of aorta: Secondary | ICD-10-CM | POA: Diagnosis not present

## 2022-02-04 DIAGNOSIS — Z23 Encounter for immunization: Secondary | ICD-10-CM

## 2022-02-04 DIAGNOSIS — I129 Hypertensive chronic kidney disease with stage 1 through stage 4 chronic kidney disease, or unspecified chronic kidney disease: Secondary | ICD-10-CM

## 2022-02-04 DIAGNOSIS — E1169 Type 2 diabetes mellitus with other specified complication: Secondary | ICD-10-CM

## 2022-02-04 DIAGNOSIS — Z8673 Personal history of transient ischemic attack (TIA), and cerebral infarction without residual deficits: Secondary | ICD-10-CM

## 2022-02-04 DIAGNOSIS — R8281 Pyuria: Secondary | ICD-10-CM

## 2022-02-04 DIAGNOSIS — R296 Repeated falls: Secondary | ICD-10-CM

## 2022-02-04 DIAGNOSIS — I739 Peripheral vascular disease, unspecified: Secondary | ICD-10-CM | POA: Diagnosis not present

## 2022-02-04 DIAGNOSIS — I5032 Chronic diastolic (congestive) heart failure: Secondary | ICD-10-CM

## 2022-02-04 DIAGNOSIS — E538 Deficiency of other specified B group vitamins: Secondary | ICD-10-CM

## 2022-02-04 DIAGNOSIS — E785 Hyperlipidemia, unspecified: Secondary | ICD-10-CM

## 2022-02-04 DIAGNOSIS — J449 Chronic obstructive pulmonary disease, unspecified: Secondary | ICD-10-CM

## 2022-02-04 DIAGNOSIS — E1122 Type 2 diabetes mellitus with diabetic chronic kidney disease: Secondary | ICD-10-CM

## 2022-02-04 DIAGNOSIS — L89150 Pressure ulcer of sacral region, unstageable: Secondary | ICD-10-CM

## 2022-02-04 DIAGNOSIS — R7989 Other specified abnormal findings of blood chemistry: Secondary | ICD-10-CM

## 2022-02-04 DIAGNOSIS — K219 Gastro-esophageal reflux disease without esophagitis: Secondary | ICD-10-CM

## 2022-02-04 DIAGNOSIS — N138 Other obstructive and reflux uropathy: Secondary | ICD-10-CM

## 2022-02-04 DIAGNOSIS — R531 Weakness: Secondary | ICD-10-CM

## 2022-02-04 DIAGNOSIS — N1832 Chronic kidney disease, stage 3b: Secondary | ICD-10-CM

## 2022-02-04 LAB — URINALYSIS, ROUTINE W REFLEX MICROSCOPIC
Bilirubin, UA: NEGATIVE
Ketones, UA: NEGATIVE
Nitrite, UA: NEGATIVE
Specific Gravity, UA: 1.02 (ref 1.005–1.030)
Urobilinogen, Ur: 0.2 mg/dL (ref 0.2–1.0)
pH, UA: 7 (ref 5.0–7.5)

## 2022-02-04 LAB — MICROSCOPIC EXAMINATION: Bacteria, UA: NONE SEEN

## 2022-02-04 LAB — MICROALBUMIN, URINE WAIVED
Creatinine, Urine Waived: 50 mg/dL (ref 10–300)
Microalb, Ur Waived: 150 mg/L — ABNORMAL HIGH (ref 0–19)
Microalb/Creat Ratio: >300 mg/g — ABNORMAL HIGH (ref <30)

## 2022-02-04 LAB — BAYER DCA HB A1C WAIVED: HB A1C (BAYER DCA - WAIVED): 6.3 % — ABNORMAL HIGH (ref 4.8–5.6)

## 2022-02-04 MED ORDER — OMEPRAZOLE 40 MG PO CPDR: 40.0000 mg | DELAYED_RELEASE_CAPSULE | Freq: Every day | ORAL | 1 refills | Status: DC

## 2022-02-04 MED ORDER — INSULIN GLARGINE-YFGN 100 UNIT/ML ~~LOC~~ SOPN: 5.0000 [IU] | PEN_INJECTOR | Freq: Every day | SUBCUTANEOUS | 1 refills | Status: DC

## 2022-02-04 MED ORDER — GABAPENTIN 100 MG PO CAPS: 100.0000 mg | ORAL_CAPSULE | Freq: Three times a day (TID) | ORAL | 1 refills | Status: DC

## 2022-02-04 MED ORDER — INSULIN ASPART 100 UNIT/ML IJ SOLN: INTRAMUSCULAR | 1 refills | Status: DC

## 2022-02-04 MED ORDER — ROSUVASTATIN CALCIUM 10 MG PO TABS: 10.0000 mg | ORAL_TABLET | Freq: Every day | ORAL | 1 refills | Status: DC

## 2022-02-04 MED ORDER — CLONIDINE HCL 0.1 MG PO TABS: 0.1000 mg | ORAL_TABLET | Freq: Two times a day (BID) | ORAL | 1 refills | Status: DC

## 2022-02-04 MED ORDER — INCRUSE ELLIPTA 62.5 MCG/ACT IN AEPB: 1.0000 | INHALATION_SPRAY | Freq: Every day | RESPIRATORY_TRACT | 3 refills | Status: DC

## 2022-02-04 MED ORDER — HYDRALAZINE HCL 50 MG PO TABS: ORAL_TABLET | ORAL | 1 refills | Status: DC

## 2022-02-04 MED ORDER — APIXABAN 2.5 MG PO TABS: 2.5000 mg | ORAL_TABLET | Freq: Two times a day (BID) | ORAL | 1 refills | Status: DC

## 2022-02-04 NOTE — Progress Notes (Signed)
ED Antimicrobial Stewardship Positive Culture Follow Up   Justin Griffith is an 81 y.o. male who presented to [facility] on 01/30/2022 with a chief complaint of  Chief Complaint  Patient presents with   Urinary Retention    Recent Results (from the past 720 hour(s))  Urine Culture     Status: Abnormal   Collection Time: 01/30/22 12:45 AM   Specimen: Urine, Catheterized  Result Value Ref Range Status   Specimen Description   Final    URINE, CATHETERIZED Performed at [facility], [address], [place], [place] [address]    Special Requests   Final    NONE Performed at [facility], [address] [place], [place] [address]    Culture >=100,000 COLONIES/mL ENTEROBACTER AEROGENES (A)  Final   Report Status 02/03/2022 FINAL  Final   Organism ID, Bacteria ENTEROBACTER AEROGENES (A)  Final      Susceptibility   Enterobacter aerogenes - MIC*    CEFAZOLIN >=64 RESISTANT Resistant     CEFEPIME <=0.12 SENSITIVE Sensitive     CEFTRIAXONE 16 RESISTANT Resistant     CIPROFLOXACIN <=0.25 SENSITIVE Sensitive     GENTAMICIN <=1 SENSITIVE Sensitive     IMIPENEM 0.5 SENSITIVE Sensitive     NITROFURANTOIN 64 INTERMEDIATE Intermediate     TRIMETH/SULFA <=20 SENSITIVE Sensitive     PIP/TAZO >=128 RESISTANT Resistant     * >=100,000 COLONIES/mL ENTEROBACTER AEROGENES    '[x]'$  Treated with cephalexin, organism resistant to prescribed antimicrobial '[]'$  Patient discharged originally without antimicrobial agent and treatment is now indicated  Called patient and left VM asking to call back to discuss culture results and treatment. Will attempt to contact patient again tomorrow.  Update 11/16: Called and spoke with patient. No urinary symptoms currently. No additional antibiotics prescribed.  ED Provider: Francis Gaines, PharmD PGY-1 Pharmacy Resident 02/04/2022 2:46 PM

## 2022-02-04 NOTE — Progress Notes (Signed)
BP (!) 209/78 (BP Location: Left Arm, Cuff Size: Normal)   Pulse 97   Temp 98.1 F (36.7 C) (Oral)   Ht _0  (1.803 m)   Wt 227 lb (103 kg)   SpO2 99%   BMI 31.66 kg/m    Subjective:    Patient ID: Justin Griffith, male    DOB: Dec 16, 1940, 81 y.o.   MRN: [id number]  HPI: Justin Griffith is a 81 y.o. male presenting on 02/04/2022 for comprehensive medical examination. Current medical complaints include:  Catheter is feeling better. Continuing to follow with urology. Saw them yesterday. He continues to fall often. He fell 2x yesterday.   HYPERTENSION / HYPERLIPIDEMIA- has not been taking any of his medicine Satisfied with current treatment? no Duration of hypertension: chronic BP monitoring frequency: not checking BP medication side effects: no Past BP meds: losartan, clonidine, hydralazine Duration of hyperlipidemia: chronic Cholesterol medication side effects: no Cholesterol supplements: none Past cholesterol medications: crestor Medication compliance: poor compliance Aspirin: no Recent stressors: no Recurrent headaches: no Visual changes: no Palpitations: no Dyspnea: yes Chest pain: no Lower extremity edema: yes Dizzy/lightheaded: no  DIABETES Hypoglycemic episodes:no Polydipsia/polyuria: no Visual disturbance: no Chest pain: no Paresthesias: yes Glucose Monitoring: no Taking Insulin?: yes  Long acting insulin: 85m semglee daily  Short acting insulin: novolog sliding scale Blood Pressure Monitoring: not checking Retinal Examination: Not up to Date Foot Exam: Not up to Date Diabetic Education: Not Completed Pneumovax: Up to Date Influenza: Given today Aspirin: no  COPD- not using anything daily COPD status: uncontrolled Satisfied with current treatment?: no Oxygen use: no Dyspnea frequency:  Cough frequency:  Rescue inhaler frequency:   Limitation of activity: yes Productive cough:  Last Spirometry:  Pneumovax: Up to Date Influenza: Up to  Date  He currently lives with: wife Interim Problems from his last visit: no  Depression Screen done today and results listed below:     09/01/2021   11:39 AM 07/08/2021    1:28 PM 10/16/2020   10:47 AM 09/02/2020    8:57 AM 08/30/2020    1:05 PM  Depression screen PHQ 2/9  Decreased Interest 1 0 0 0 0  Down, Depressed, Hopeless 1 0 0 0 0  PHQ - 2 Score 2 0 0 0 0  Altered sleeping 0 0     Tired, decreased energy 0 0     Change in appetite 0 0     Feeling bad or failure about yourself  0 0     Trouble concentrating 0 0     Moving slowly or fidgety/restless 0 0     Suicidal thoughts  0     PHQ-9 Score 2 0     Difficult doing work/chores Not difficult at all       Past Medical History:  Past Medical History:  Diagnosis Date   Acute urinary retention    Arthritis    Asthma 2000   Back pain    Colon polyp 2012   COPD (chronic obstructive pulmonary disease) (HCC)    Emphysema of lung ([facility]    Heart murmur    Hernia 2000   History of cold sores 05/06/2020   History of colon cancer    Hyperlipidemia    Hypertension    Personal history of malignant neoplasm of large intestine    Personal history of tobacco use, presenting hazards to health    Septic shock ([facility] 11/18/2021   Sleep apnea    Special screening for malignant  neoplasms, colon    Type 2 diabetes mellitus with proteinuria ([place]) 10/11/2014    Surgical History:  Past Surgical History:  Procedure Laterality Date   CARDIAC CATHETERIZATION Left 09/25/2015   Procedure: Left Heart Cath and Coronary Angiography;  Surgeon: Yolonda Kida, MD;  Location: [place] [facility];  Service: Cardiovascular;  Laterality: Left;   CHOLECYSTECTOMY  1970   COLON SURGERY  07-16-99   sigmoid colon resection with primary anastomosis, chemotherapy for metastatic disease   COLONOSCOPY  2001, 2012   Dr Bary Castilla, tubular adenoma of the cecum and ascending colon in 2012.   COLONOSCOPY WITH PROPOFOL N/A 02/12/2016   Procedure: COLONOSCOPY  WITH PROPOFOL;  Surgeon: Robert Bellow, MD;  [facility]: [facility];  Service: Endoscopy;  Laterality: N/A;   CORONARY STENT INTERVENTION N/A 05/17/2020   Procedure: CORONARY STENT INTERVENTION;  Surgeon: Yolonda Kida, MD;  Location: [place] [facility];  Service: Cardiovascular;  Laterality: N/A;  LAD   ESOPHAGOGASTRODUODENOSCOPY (EGD) WITH PROPOFOL N/A 02/12/2016   Procedure: ESOPHAGOGASTRODUODENOSCOPY (EGD) WITH PROPOFOL;  Surgeon: Robert Bellow, MD;  Location: [facility] ENDOSCOPY;  Service: Endoscopy;  Laterality: N/A;   HERNIA REPAIR Right    right inguinal hernia repair   JOINT REPLACEMENT Bilateral    knee replacement   KNEE SURGERY Bilateral 2010   LEFT HEART CATH AND CORONARY ANGIOGRAPHY N/A 05/17/2020   Procedure: LEFT HEART CATH AND CORONARY ANGIOGRAPHY possible PCI and stent;  Surgeon: Yolonda Kida, MD;  Location: [place] [facility];  Service: Cardiovascular;  Laterality: N/A;   MYRINGOTOMY WITH TUBE PLACEMENT Bilateral 08/16/2014   Procedure: MYRINGOTOMY WITH TUBE PLACEMENT;  Surgeon: Carloyn Manner, MD;  Location: [facility] ORS;  Service: ENT;  Laterality: Bilateral;   PERIPHERAL VASCULAR CATHETERIZATION Right 09/04/2015   Procedure: Lower Extremity Angiography;  Surgeon: Katha Cabal, MD;  Location: [place] [facility];  Service: Cardiovascular;  Laterality: Right;   PERIPHERAL VASCULAR CATHETERIZATION  09/04/2015   Procedure: Lower Extremity Intervention;  Surgeon: Katha Cabal, MD;  Location: [place] [facility];  Service: Cardiovascular;;   TONSILLECTOMY     TYMPANOSTOMY TUBE PLACEMENT      Medications:  Current Outpatient Medications on File Prior to Visit  Medication Sig   ascorbic acid (VITAMIN C) 500 MG tablet Take 1 tablet (500 mg total) by mouth daily.   Multiple Vitamin (MULTIVITAMIN WITH MINERALS) TABS tablet Take 1 tablet by mouth daily.   Zinc Oxide 10 % OINT Apply topically.   No current facility-administered medications on file prior  to visit.    Allergies:  Allergies  Allergen Reactions   Farxiga [Dapagliflozin] Rash    Causes a severe rash in groin area    Dulaglutide Diarrhea and Other (See Comments)   Liraglutide Diarrhea and Other (See Comments)   Jardiance [Empagliflozin] Rash    Social History:  Social History   Socioeconomic History   Marital status: Married    Spouse name: Not on file   Number of children: Not on file   Years of education: Not on file   Highest education level: GED or equivalent  Occupational History   Occupation: retired    Comment: [employer]  Tobacco Use   Smoking status: Former    Packs/day: 1.00    Years: 30.00    Total pack years: 30.00    Types: Cigarettes    Quit date: 03/23/1989    Years since quitting: 32.8    Passive exposure: Past   Smokeless tobacco: Never  Vaping Use  Vaping Use: Never used  Substance and Sexual Activity   Alcohol use: No    Alcohol/week: 0.0 standard drinks of alcohol   Drug use: No   Sexual activity: Not Currently  Other Topics Concern   Not on file  Social History Narrative   [employer]    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    Social Determinants of Health   Financial Resource Strain: Low Risk  (09/01/2021)   Overall Financial Resource Strain (CARDIA)    Difficulty of Paying Living Expenses: Not hard at all  Food Insecurity: No Food Insecurity (12/05/2021)   Hunger Vital Sign    Worried About Running Out of Food in the Last Year: Never true    Ran Out of Food in the Last Year: Never true  Transportation Needs: No Transportation Needs (12/05/2021)   PRAPARE - Hydrologist (Medical): No    Lack of Transportation (Non-Medical): No  Physical Activity: Inactive (09/01/2021)   Exercise Vital Sign    Days of Exercise per Week: 0 days    Minutes of Exercise per Session: 0 min  Stress: Stress Concern Present (09/01/2021)   [place]    Feeling of Stress : To  some extent  Social Connections: Socially Isolated (09/01/2021)   Social Connection and Isolation Panel [NHANES]    Frequency of Communication with Friends and Family: Never    Frequency of Social Gatherings with Friends and Family: Once a week    Attends Religious Services: More than 4 times per year    Active Member of [employer] or Organizations: No    Attends Archivist Meetings: Never    Marital Status: Divorced  Human resources officer Violence: Not At Risk (12/05/2021)   Humiliation, Afraid, Rape, and Kick questionnaire    Fear of Current or Ex-Partner: No    Emotionally Abused: No    Physically Abused: No    Sexually Abused: No   Social History   Tobacco Use  Smoking Status Former   Packs/day: 1.00   Years: 30.00   Total pack years: 30.00   Types: Cigarettes   Quit date: 03/23/1989   Years since quitting: 32.8   Passive exposure: Past  Smokeless Tobacco Never   Social History   Substance and Sexual Activity  Alcohol Use No   Alcohol/week: 0.0 standard drinks of alcohol    Family History:  Family History  Problem Relation Age of Onset   Diabetes Father    Esophageal cancer Mother    Alzheimer's disease Paternal Uncle     Past medical history, surgical history, medications, allergies, family history and social history reviewed with patient today and changes made to appropriate areas of the chart.   Review of Systems  Constitutional: Negative.   HENT: Negative.    Eyes: Negative.   Respiratory:  Positive for shortness of breath. Negative for cough, hemoptysis, sputum production and wheezing.   Cardiovascular:  Positive for leg swelling. Negative for chest pain, palpitations, orthopnea, claudication and PND.  Gastrointestinal:  Positive for constipation. Negative for abdominal pain, blood in stool, diarrhea, heartburn, melena, nausea and vomiting.  Genitourinary: Negative.   Musculoskeletal:  Positive for back pain and falls. Negative for joint pain, myalgias and  neck pain.  Skin:  Positive for itching. Negative for rash.       Wound on his bottom  Neurological: Negative.   Endo/Heme/Allergies:  Negative for environmental allergies and polydipsia. Bruises/bleeds easily.  Psychiatric/Behavioral: Negative.  All other ROS negative except what is listed above and in the HPI.      Objective:    BP (!) 209/78 (BP Location: Left Arm, Cuff Size: Normal)   Pulse 97   Temp 98.1 F (36.7 C) (Oral)   Ht _0  (1.803 m)   Wt 227 lb (103 kg)   SpO2 99%   BMI 31.66 kg/m   Wt Readings from Last 3 Encounters:  02/04/22 227 lb (103 kg)  02/01/22 227 lb 1.2 oz (103 kg)  01/30/22 227 lb 1.2 oz (103 kg)    Physical Exam Vitals and nursing note reviewed.  Constitutional:      General: He is not in acute distress.    Appearance: Normal appearance. He is obese. He is not ill-appearing, toxic-appearing or diaphoretic.  HENT:     Head: Normocephalic and atraumatic.     Right Ear: Tympanic membrane, ear canal and external ear normal. There is no impacted cerumen.     Left Ear: Tympanic membrane, ear canal and external ear normal. There is no impacted cerumen.     Nose: Nose normal. No congestion or rhinorrhea.     Mouth/Throat:     Mouth: Mucous membranes are moist.     Pharynx: Oropharynx is clear. No oropharyngeal exudate or posterior oropharyngeal erythema.  Eyes:     General: No scleral icterus.       Right eye: No discharge.        Left eye: No discharge.     Extraocular Movements: Extraocular movements intact.     Conjunctiva/sclera: Conjunctivae normal.     Pupils: Pupils are equal, round, and reactive to light.  Neck:     Vascular: No carotid bruit.  Cardiovascular:     Rate and Rhythm: Normal rate and regular rhythm.     Pulses: Normal pulses.     Heart sounds: No murmur heard.    No friction rub. No gallop.  Pulmonary:     Effort: Pulmonary effort is normal. No respiratory distress.     Breath sounds: Normal breath sounds. No  stridor. No wheezing, rhonchi or rales.  Chest:     Chest wall: No tenderness.  Abdominal:     General: Abdomen is flat. Bowel sounds are normal. There is no distension.     Palpations: Abdomen is soft. There is no mass.     Tenderness: There is no abdominal tenderness. There is no right CVA tenderness, left CVA tenderness, guarding or rebound.     Hernia: No hernia is present.  Genitourinary:    Comments: Genital exam deferred with shared decision making Musculoskeletal:        General: No swelling, tenderness, deformity or signs of injury.     Cervical back: Normal range of motion and neck supple. No rigidity. No muscular tenderness.     Right lower leg: No edema.     Left lower leg: No edema.  Lymphadenopathy:     Cervical: No cervical adenopathy.  Skin:    General: Skin is warm and dry.     Capillary Refill: Capillary refill takes less than 2 seconds.     Coloration: Skin is not jaundiced or pale.     Findings: No bruising, erythema, lesion or rash.     Comments: Unstageable pressure ulcer on L buttock  Neurological:     General: No focal deficit present.     Mental Status: He is alert and oriented to person, place, and time.     Cranial  Nerves: No cranial nerve deficit.     Sensory: No sensory deficit.     Motor: No weakness.     Coordination: Coordination normal.     Gait: Gait normal.     Deep Tendon Reflexes: Reflexes normal.  Psychiatric:        Mood and Affect: Mood normal.        Behavior: Behavior normal.        Thought Content: Thought content normal.        Judgment: Judgment normal.     Results for orders placed or performed in visit on 02/04/22  Microscopic Examination   Urine  Result Value Ref Range   WBC, UA 0-5 0 - 5 /hpf   RBC, Urine 3-10 (A) 0 - 2 /hpf   Epithelial Cells (non renal) 0-10 0 - 10 /hpf   Mucus, UA Present (A) Not Estab.   Bacteria, UA None seen None seen/Few  Comprehensive metabolic panel  Result Value Ref Range   Glucose 180 (H)  70 - 99 mg/dL   BUN 11 8 - 27 mg/dL   Creatinine, Ser 1.21 0.76 - 1.27 mg/dL   eGFR 60 >59 mL/min/1.73   BUN/Creatinine Ratio 9 (L) 10 - 24   Sodium 141 134 - 144 mmol/L   Potassium 3.5 3.5 - 5.2 mmol/L   Chloride 101 96 - 106 mmol/L   CO2 25 20 - 29 mmol/L   Calcium 10.2 8.6 - 10.2 mg/dL   Total Protein 6.6 6.0 - 8.5 g/dL   Albumin 3.4 (L) 3.7 - 4.7 g/dL   Globulin, Total 3.2 1.5 - 4.5 g/dL   Albumin/Globulin Ratio 1.1 (L) 1.2 - 2.2   Bilirubin Total 0.3 0.0 - 1.2 mg/dL   Alkaline Phosphatase 63 44 - 121 IU/L   AST 12 0 - 40 IU/L   ALT 9 0 - 44 IU/L  CBC with Differential/Platelet  Result Value Ref Range   WBC 7.0 3.4 - 10.8 x10E3/uL   RBC 4.20 4.14 - 5.80 x10E6/uL   Hemoglobin 12.4 (L) 13.0 - 17.7 g/dL   Hematocrit 37.7 37.5 - 51.0 %   MCV 90 79 - 97 fL   MCH 29.5 26.6 - 33.0 pg   MCHC 32.9 31.5 - 35.7 g/dL   RDW 13.8 11.6 - 15.4 %   Platelets 317 150 - 450 x10E3/uL   Neutrophils 70 Not Estab. %   Lymphs 20 Not Estab. %   Monocytes 7 Not Estab. %   Eos 2 Not Estab. %   Basos 1 Not Estab. %   Neutrophils Absolute 4.9 1.4 - 7.0 x10E3/uL   Lymphocytes Absolute 1.4 0.7 - 3.1 x10E3/uL   Monocytes Absolute 0.5 0.1 - 0.9 x10E3/uL   EOS (ABSOLUTE) 0.1 0.0 - 0.4 x10E3/uL   Basophils Absolute 0.1 0.0 - 0.2 x10E3/uL   Immature Granulocytes 0 Not Estab. %   Immature Grans (Abs) 0.0 0.0 - 0.1 x10E3/uL  Lipid Panel w/o Chol/HDL Ratio  Result Value Ref Range   Cholesterol, Total 252 (H) 100 - 199 mg/dL   Triglycerides 235 (H) 0 - 149 mg/dL   HDL 39 (L) >39 mg/dL   VLDL Cholesterol Cal 44 (H) 5 - 40 mg/dL   LDL Chol Calc (NIH) 169 (H) 0 - 99 mg/dL  TSH  Result Value Ref Range   TSH 4.170 0.450 - 4.500 uIU/mL  Bayer DCA Hb A1c Waived  Result Value Ref Range   HB A1C (BAYER DCA - WAIVED) 6.3 (H) 4.8 - 5.6 %  B12  Result Value Ref Range   Vitamin B-12 792 232 - 1,245 pg/mL  Urinalysis, Routine w reflex microscopic  Result Value Ref Range   Specific Gravity, UA 1.020 1.005 -  1.030   pH, UA 7.0 5.0 - 7.5   Color, UA Yellow Yellow   Appearance Ur Clear Clear   Leukocytes,UA Trace (A) Negative   Protein,UA 3+ (A) Negative/Trace   Glucose, UA Trace (A) Negative   Ketones, UA Negative Negative   RBC, UA 2+ (A) Negative   Bilirubin, UA Negative Negative   Urobilinogen, Ur 0.2 0.2 - 1.0 mg/dL   Nitrite, UA Negative Negative   Microscopic Examination See below:   Microalbumin, Urine Waived  Result Value Ref Range   Microalb, Ur Waived 150 (H) 0 - 19 mg/L   Creatinine, Urine Waived 50 10 - 300 mg/dL   Microalb/Creat Ratio >300 (H) <30 mg/g      Assessment & Plan:   Problem List Items Addressed This Visit       Cardiovascular and Mediastinum   PAD (peripheral artery disease) ([place])    Will restart BP and cholesterol medicine. Recheck 1 week.       Relevant Medications   apixaban (ELIQUIS) 2.5 MG TABS tablet   cloNIDine (CATAPRES) 0.1 MG tablet   hydrALAZINE (APRESOLINE) 50 MG tablet   rosuvastatin (CRESTOR) 10 MG tablet   Other Relevant Orders   Comprehensive metabolic panel (Completed)   CBC with Differential/Platelet (Completed)   Ambulatory referral to [place] Referral to Chronic Care Management Services   Aortic atherosclerosis ([place])    Will restart BP and cholesterol medicine. Recheck 1 week.       Relevant Medications   apixaban (ELIQUIS) 2.5 MG TABS tablet   cloNIDine (CATAPRES) 0.1 MG tablet   hydrALAZINE (APRESOLINE) 50 MG tablet   rosuvastatin (CRESTOR) 10 MG tablet   Other Relevant Orders   Comprehensive metabolic panel (Completed)   CBC with Differential/Platelet (Completed)   Lipid Panel w/o Chol/HDL Ratio (Completed)   Ambulatory referral to Home Health   AMB Referral to Chronic Care Management Services   CAD (coronary artery disease)    Will restart BP and cholesterol medicine. Recheck 1 week.       Relevant Medications   apixaban (ELIQUIS) 2.5 MG TABS tablet   cloNIDine (CATAPRES) 0.1 MG tablet   hydrALAZINE  (APRESOLINE) 50 MG tablet   rosuvastatin (CRESTOR) 10 MG tablet   Other Relevant Orders   Comprehensive metabolic panel (Completed)   CBC with Differential/Platelet (Completed)   Ambulatory referral to [place]   AMB Referral to Chronic Care Management Services   Chronic diastolic CHF (congestive heart failure) ([place])    Euvolemic today. Continue to monitor. Restarting medication. Follow up 1 week.       Relevant Medications   apixaban (ELIQUIS) 2.5 MG TABS tablet   cloNIDine (CATAPRES) 0.1 MG tablet   hydrALAZINE (APRESOLINE) 50 MG tablet   rosuvastatin (CRESTOR) 10 MG tablet   Other Relevant Orders   Comprehensive metabolic panel (Completed)   CBC with Differential/Platelet (Completed)   Ambulatory referral to [place]   AMB Referral to Chronic Care Management Services     Respiratory   Chronic obstructive pulmonary disease (COPD) ([place])    Will restart his incruse. Continue to monitor. Call with any concerns.       Relevant Medications   umeclidinium bromide (INCRUSE ELLIPTA) 62.5 MCG/ACT AEPB   Other Relevant Orders   Comprehensive metabolic panel (Completed)  CBC with Differential/Platelet (Completed)   Ambulatory referral to [place] Referral to Chronic Care Management Services     Digestive   GERD without esophagitis    Doing well on current regimen. Continue current regimen. Call with any concerns. Refills given.       Relevant Medications   omeprazole (PRILOSEC) 40 MG capsule   Other Relevant Orders   Comprehensive metabolic panel (Completed)   CBC with Differential/Platelet (Completed)   Ambulatory referral to [place] Referral to Chronic Care Management Services     Endocrine   Hyperlipidemia associated with type 2 diabetes mellitus ([place])    Restarting medication. Recheck 1 week.      Relevant Medications   apixaban (ELIQUIS) 2.5 MG TABS tablet   cloNIDine (CATAPRES) 0.1 MG tablet   hydrALAZINE (APRESOLINE) 50 MG tablet    insulin aspart (NOVOLOG) 100 UNIT/ML injection   insulin glargine-yfgn (SEMGLEE) 100 UNIT/ML Pen   rosuvastatin (CRESTOR) 10 MG tablet   Other Relevant Orders   Comprehensive metabolic panel (Completed)   CBC with Differential/Platelet (Completed)   Lipid Panel w/o Chol/HDL Ratio (Completed)   Ambulatory referral to Home Health   AMB Referral to Chronic Care Management Services   Type II diabetes mellitus with renal manifestations ([place])    Under good control on current regimen with A1c of 6.3- on a small amount of basal insulin. May want to consider changing medication in the future.       Relevant Medications   insulin aspart (NOVOLOG) 100 UNIT/ML injection   insulin glargine-yfgn (SEMGLEE) 100 UNIT/ML Pen   rosuvastatin (CRESTOR) 10 MG tablet   Other Relevant Orders   Comprehensive metabolic panel (Completed)   CBC with Differential/Platelet (Completed)   Lipid Panel w/o Chol/HDL Ratio (Completed)   Bayer DCA Hb A1c Waived (Completed)   Ambulatory referral to Home Health   AMB Referral to Chronic Care Management Services   Microalbumin, Urine Waived (Completed)     Genitourinary   BPH with obstruction/lower urinary tract symptoms Foley dependent    Continue to follow with urology. Stable for now. Continue to monitor.       Relevant Orders   Comprehensive metabolic panel (Completed)   CBC with Differential/Platelet (Completed)   Ambulatory referral to [place] Referral to Chronic Care Management Services   Urinalysis, Routine w reflex microscopic (Completed)   Stage 3b chronic kidney disease (CKD) ([place])    Rechecking labs today. Await results. Treat as needed.       Relevant Orders   Comprehensive metabolic panel (Completed)   CBC with Differential/Platelet (Completed)   Ambulatory referral to [place] Referral to Chronic Care Management Services   Benign hypertensive renal disease    Under very poor control. Not taking his medication. Will restart  and follow up 1 week.       Relevant Orders   Comprehensive metabolic panel (Completed)   CBC with Differential/Platelet (Completed)   Ambulatory referral to [place] Referral to Chronic Care Management Services     Other   B12 deficiency    Rechecking labs today. Await results. Treat as needed.       Relevant Orders   Comprehensive metabolic panel (Completed)   CBC with Differential/Platelet (Completed)   B12 (Completed)   Ambulatory referral to [place] Referral to Chronic Care Management Services   History of CVA (cerebrovascular accident)    Continues to fall  frequently. Will get home health PT in and nursing. Referral placed today. Call with any concerns.       Relevant Orders   Comprehensive metabolic panel (Completed)   CBC with Differential/Platelet (Completed)   Ambulatory referral to [place] Referral to Chronic Care Management Services   Elevated TSH    Rechecking labs today. Await results. Treat as needed.       Relevant Orders   Comprehensive metabolic panel (Completed)   CBC with Differential/Platelet (Completed)   TSH (Completed)   Ambulatory referral to [place] Referral to Chronic Care Management Services   Falls frequently    Continues to fall frequently. Will get home health PT in and nursing. Referral placed today. Call with any concerns.       Relevant Orders   Comprehensive metabolic panel (Completed)   CBC with Differential/Platelet (Completed)   Ambulatory referral to [place] Referral to Chronic Care Management Services   Generalized weakness    Continues to fall frequently. Will get home health PT in and nursing. Referral placed today. Call with any concerns.       Relevant Orders   Comprehensive metabolic panel (Completed)   CBC with Differential/Platelet (Completed)   Ambulatory referral to [place] Referral to Chronic Care Management Services   Dyslipidemia    Will restart his  medicine. Call with any concerns. Recheck 1 week.       Relevant Medications   rosuvastatin (CRESTOR) 10 MG tablet   Other Relevant Orders   Comprehensive metabolic panel (Completed)   CBC with Differential/Platelet (Completed)   Ambulatory referral to [place]   AMB Referral to Chronic Care Management Services   HLD (hyperlipidemia)   Relevant Medications   apixaban (ELIQUIS) 2.5 MG TABS tablet   cloNIDine (CATAPRES) 0.1 MG tablet   hydrALAZINE (APRESOLINE) 50 MG tablet   rosuvastatin (CRESTOR) 10 MG tablet   Other Relevant Orders   Comprehensive metabolic panel (Completed)   CBC with Differential/Platelet (Completed)   Ambulatory referral to [place] Referral to Chronic Care Management Services   Pressure injury of sacral region, unstageable [facility])    Needs help with wound management. Wound dressed today. Referral for home health placed today.       Other Visit Diagnoses     Routine general medical examination at a health care facility    -  Primary   Vaccines updated. Screening labs checked today. Continue diet and exercise. Call with any concerns.   Relevant Orders   Ambulatory referral to [place] Referral to Chronic Care Management Services   Needs flu shot       Flu shot given today.   Relevant Orders   Flu Vaccine QUAD High Dose([place])   Pyuria       Will check urine culture. Await results.   Relevant Orders   Urine Culture        LABORATORY TESTING:  Health maintenance labs ordered today as discussed above.   IMMUNIZATIONS:   - Tdap: Tetanus vaccination status reviewed: last tetanus booster within 10 years. - Influenza: Administered today - Pneumovax: Up to date - Prevnar: Up to date - COVID: Up to date - HPV: Not applicable - Shingrix vaccine: Up to date  SCREENING: - Colonoscopy: Not applicable  Discussed with patient purpose of the colonoscopy is to detect colon cancer at curable precancerous or early stages    PATIENT  COUNSELING:    Sexuality: Discussed sexually transmitted diseases, partner selection, use of condoms, avoidance of unintended pregnancy  and contraceptive alternatives.   Advised to avoid cigarette smoking.  I discussed with the patient that most people either abstain from alcohol or drink within safe limits (<=14/week and <=4 drinks/occasion for males, <=7/weeks and <= 3 drinks/occasion for females) and that the risk for alcohol disorders and other health effects rises proportionally with the number of drinks per week and how often a drinker exceeds daily limits.  Discussed cessation/primary prevention of drug use and availability of treatment for abuse.   Diet: Encouraged to adjust caloric intake to maintain  or achieve ideal body weight, to reduce intake of dietary saturated fat and total fat, to limit sodium intake by avoiding high sodium foods and not adding table salt, and to maintain adequate dietary potassium and calcium preferably from fresh fruits, vegetables, and low-fat dairy products.    stressed the importance of regular exercise  Injury prevention: Discussed safety belts, safety helmets, smoke detector, smoking near bedding or upholstery.   Dental health: Discussed importance of regular tooth brushing, flossing, and dental visits.   Follow up plan: NEXT PREVENTATIVE PHYSICAL DUE IN 1 YEAR. Return in about 1 week (around 02/11/2022).

## 2022-02-05 LAB — COMPREHENSIVE METABOLIC PANEL
ALT: 9 IU/L (ref 0–44)
AST: 12 IU/L (ref 0–40)
Albumin/Globulin Ratio: 1.1 — ABNORMAL LOW (ref 1.2–2.2)
Albumin: 3.4 g/dL — ABNORMAL LOW (ref 3.7–4.7)
Alkaline Phosphatase: 63 IU/L (ref 44–121)
BUN/Creatinine Ratio: 9 — ABNORMAL LOW (ref 10–24)
BUN: 11 mg/dL (ref 8–27)
Bilirubin Total: 0.3 mg/dL (ref 0.0–1.2)
CO2: 25 mmol/L (ref 20–29)
Calcium: 10.2 mg/dL (ref 8.6–10.2)
Chloride: 101 mmol/L (ref 96–106)
Creatinine, Ser: 1.21 mg/dL (ref 0.76–1.27)
Globulin, Total: 3.2 g/dL (ref 1.5–4.5)
Glucose: 180 mg/dL — ABNORMAL HIGH (ref 70–99)
Potassium: 3.5 mmol/L (ref 3.5–5.2)
Sodium: 141 mmol/L (ref 134–144)
Total Protein: 6.6 g/dL (ref 6.0–8.5)
eGFR: 60 mL/min/{1.73_m2} (ref >59)

## 2022-02-05 LAB — VITAMIN B12: Vitamin B-12: 792 pg/mL (ref 232–1245)

## 2022-02-05 LAB — CBC WITH DIFFERENTIAL/PLATELET
Basophils Absolute: 0.1 10*3/uL (ref 0.0–0.2)
Basos: 1 %
EOS (ABSOLUTE): 0.1 10*3/uL (ref 0.0–0.4)
Eos: 2 %
Hematocrit: 37.7 % (ref 37.5–51.0)
Hemoglobin: 12.4 g/dL — ABNORMAL LOW (ref 13.0–17.7)
Immature Grans (Abs): 0 10*3/uL (ref 0.0–0.1)
Immature Granulocytes: 0 %
Lymphocytes Absolute: 1.4 10*3/uL (ref 0.7–3.1)
Lymphs: 20 %
MCH: 29.5 pg (ref 26.6–33.0)
MCHC: 32.9 g/dL (ref 31.5–35.7)
MCV: 90 fL (ref 79–97)
Monocytes Absolute: 0.5 10*3/uL (ref 0.1–0.9)
Monocytes: 7 %
Neutrophils Absolute: 4.9 10*3/uL (ref 1.4–7.0)
Neutrophils: 70 %
Platelets: 317 10*3/uL (ref 150–450)
RBC: 4.2 x10E6/uL (ref 4.14–5.80)
RDW: 13.8 % (ref 11.6–15.4)
WBC: 7 10*3/uL (ref 3.4–10.8)

## 2022-02-05 LAB — LIPID PANEL W/O CHOL/HDL RATIO
Cholesterol, Total: 252 mg/dL — ABNORMAL HIGH (ref 100–199)
HDL: 39 mg/dL — ABNORMAL LOW (ref >39)
LDL Chol Calc (NIH): 169 mg/dL — ABNORMAL HIGH (ref 0–99)
Triglycerides: 235 mg/dL — ABNORMAL HIGH (ref 0–149)
VLDL Cholesterol Cal: 44 mg/dL — ABNORMAL HIGH (ref 5–40)

## 2022-02-05 LAB — TSH: TSH: 4.17 u[IU]/mL (ref 0.450–4.500)

## 2022-02-05 NOTE — Assessment & Plan Note (Signed)
Will restart his medicine. Call with any concerns. Recheck 1 week.

## 2022-02-05 NOTE — Assessment & Plan Note (Signed)
Continues to fall frequently. Will get home health PT in and nursing. Referral placed today. Call with any concerns.

## 2022-02-05 NOTE — Assessment & Plan Note (Signed)
Will restart his incruse. Continue to monitor. Call with any concerns.

## 2022-02-05 NOTE — Assessment & Plan Note (Signed)
Under very poor control. Not taking his medication. Will restart and follow up 1 week.

## 2022-02-05 NOTE — Assessment & Plan Note (Signed)
Needs help with wound management. Wound dressed today. Referral for home health placed today.

## 2022-02-05 NOTE — Assessment & Plan Note (Signed)
Under good control on current regimen with A1c of 6.3- on a small amount of basal insulin. May want to consider changing medication in the future.

## 2022-02-05 NOTE — Assessment & Plan Note (Signed)
Rechecking labs today. Await results. Treat as needed.  °

## 2022-02-05 NOTE — Assessment & Plan Note (Signed)
Restarting medication. Recheck 1 week.

## 2022-02-05 NOTE — Assessment & Plan Note (Signed)
Continue to follow with urology. Stable for now. Continue to monitor.

## 2022-02-05 NOTE — Assessment & Plan Note (Signed)
Will restart BP and cholesterol medicine. Recheck 1 week.

## 2022-02-05 NOTE — Assessment & Plan Note (Signed)
Euvolemic today. Continue to monitor. Restarting medication. Follow up 1 week.

## 2022-02-05 NOTE — Assessment & Plan Note (Signed)
Doing well on current regimen. Continue current regimen. Call with any concerns. Refills given.

## 2022-02-06 ENCOUNTER — Telehealth: Payer: Self-pay

## 2022-02-06 ENCOUNTER — Encounter: Payer: Self-pay | Admitting: Family Medicine

## 2022-02-06 DIAGNOSIS — Z23 Encounter for immunization: Secondary | ICD-10-CM | POA: Diagnosis not present

## 2022-02-06 LAB — URINE CULTURE: Organism ID, Bacteria: NO GROWTH

## 2022-02-06 NOTE — Telephone Encounter (Signed)
Spoke with [employer] pharmacist Page and was notified that patient prescription Semglee is non-formulary and would need to have a new prescription for Lantus. Please advise?

## 2022-02-08 MED ORDER — LANTUS SOLOSTAR 100 UNIT/ML ~~LOC~~ SOPN: 5.0000 [IU] | PEN_INJECTOR | Freq: Every day | SUBCUTANEOUS | 0 refills | Status: DC

## 2022-02-09 ENCOUNTER — Telehealth: Payer: Self-pay | Admitting: *Deleted

## 2022-02-09 NOTE — Telephone Encounter (Signed)
Home Health is calling- wants to review current medication list. Patient was released from rehab and she needs to verify current list:  Reviewed current medication list in chart: Patient states he does not have testing supplies/meter for glucose checking at home Patient is not taking [place] log-sliding scale- medication not found Rosuvastatin was stopped at rehab- no longer on list from rehab.   Patient has appointment tomorrow with PCP- please send updated list to [facility] nurse at [place] contact: [phone]

## 2022-02-09 NOTE — Telephone Encounter (Signed)
        Patient  visited [facility] on 02/02/2022  for urine retention    Telephone encounter attempt :  1st Unable to leave message .  [place] [phone] [address] , [place] [address] Email : [email or web address]

## 2022-02-10 ENCOUNTER — Ambulatory Visit: Payer: Self-pay

## 2022-02-10 ENCOUNTER — Telehealth: Payer: Self-pay | Admitting: Family Medicine

## 2022-02-10 ENCOUNTER — Encounter: Payer: Self-pay | Admitting: Family Medicine

## 2022-02-10 ENCOUNTER — Other Ambulatory Visit: Payer: Self-pay

## 2022-02-10 ENCOUNTER — Ambulatory Visit (INDEPENDENT_AMBULATORY_CARE_PROVIDER_SITE_OTHER): Payer: HMO | Admitting: Family Medicine

## 2022-02-10 VITALS — BP 174/78 | HR 65 | Temp 98.1°F | Wt 235.6 lb

## 2022-02-10 DIAGNOSIS — E785 Hyperlipidemia, unspecified: Secondary | ICD-10-CM | POA: Diagnosis not present

## 2022-02-10 DIAGNOSIS — I129 Hypertensive chronic kidney disease with stage 1 through stage 4 chronic kidney disease, or unspecified chronic kidney disease: Secondary | ICD-10-CM

## 2022-02-10 MED ORDER — HYDRALAZINE HCL 50 MG PO TABS: ORAL_TABLET | ORAL | 1 refills | Status: DC

## 2022-02-10 MED ORDER — BLOOD GLUCOSE METER KIT: 1.0000 | PACK | Freq: Every day | 0 refills | Status: DC

## 2022-02-10 MED ORDER — GABAPENTIN 100 MG PO CAPS: 100.0000 mg | ORAL_CAPSULE | Freq: Three times a day (TID) | ORAL | 1 refills | Status: DC

## 2022-02-10 MED ORDER — APIXABAN 2.5 MG PO TABS: 2.5000 mg | ORAL_TABLET | Freq: Two times a day (BID) | ORAL | 1 refills | Status: DC

## 2022-02-10 MED ORDER — INCRUSE ELLIPTA 62.5 MCG/ACT IN AEPB: 1.0000 | INHALATION_SPRAY | Freq: Every day | RESPIRATORY_TRACT | 3 refills | Status: DC

## 2022-02-10 MED ORDER — CLONIDINE HCL 0.1 MG PO TABS: 0.1000 mg | ORAL_TABLET | Freq: Two times a day (BID) | ORAL | 1 refills | Status: DC

## 2022-02-10 MED ORDER — ROSUVASTATIN CALCIUM 10 MG PO TABS: 10.0000 mg | ORAL_TABLET | Freq: Every day | ORAL | 1 refills | Status: DC

## 2022-02-10 MED ORDER — OMEPRAZOLE 40 MG PO CPDR: 40.0000 mg | DELAYED_RELEASE_CAPSULE | Freq: Every day | ORAL | 1 refills | Status: DC

## 2022-02-10 NOTE — Telephone Encounter (Signed)
Called and gave verbal ok per Dr. Wynetta Emery.

## 2022-02-10 NOTE — Telephone Encounter (Signed)
Pt has an app today at the office today so [facility] can not do visit today for admission to home health services / needs ok to delay until tomorrow / please advise

## 2022-02-10 NOTE — Telephone Encounter (Addendum)
Can we give a verbal order to  home health can get him MEPILEX SACRUM patches for that spot on his bottom. Needs dakin's solution to wash and polymem with silver pad with the mepilex sacrum bandage on top.

## 2022-02-10 NOTE — Telephone Encounter (Signed)
  Chief Complaint: HTN Symptoms: BP 200/90 Frequency: today  Pertinent Negatives: Patient denies any symptoms  Disposition: '[]'$ ED /'[]'$ Urgent Care (no appt availability in office) / '[x]'$ Appointment(In office/virtual)/ '[]'$  [facility]/ '[]'$ Home Care/ '[]'$ Refused Recommended Disposition /'[]'$ [facility] [facility]/ '[]'$  Follow-up with PCP Additional Notes: Emily, PT with [facility] there to do PT eval and checked BP. Hadn't done eval yet d/t BP but will keep things light. Had pt go ahead and take meds. Pt has appt this afternoon with Dr. Wynetta Emery. Advised to keep appt and call back in 30 mins to recheck BP. She states that will give [place] a call back after eval.   Reason for Disposition  [4] Systolic BP  >= 287 OR Diastolic >= 681 AND [1] having NO cardiac or neurologic symptoms  Answer Assessment - Initial Assessment Questions 1. BLOOD PRESSURE: "What is the blood pressure?" "Did you take at least two measurements 5 minutes apart?"     200/90 2. ONSET: "When did you take your blood pressure?"     Today  3. HOW: "How did you take your blood pressure?" (e.g., automatic home BP monitor, visiting nurse)     PT HH eval  4. HISTORY: "Do you have a history of high blood pressure?"     yes 5. MEDICINES: "Are you taking any medicines for blood pressure?" "Have you missed any doses recently?"     Yes hadn't taken BP meds this morning  6. OTHER SYMPTOMS: "Do you have any symptoms?" (e.g., blurred vision, chest pain, difficulty breathing, headache, weakness)     no  Protocols used: Blood Pressure - High-A-AH

## 2022-02-10 NOTE — Telephone Encounter (Signed)
Copied from [place] [id number]. Topic: Quick Communication - Home Health Verbal Orders >> Feb 10, 2022 11:40 AM Everette C wrote: Caller/Agency: Cherly Anderson / [id number]  Callback Number: [phone] Requesting OT/PT/Skilled Nursing/Social Work/Speech Therapy: PT  Frequency: 1w1 3w1 2w4

## 2022-02-10 NOTE — Telephone Encounter (Signed)
Routing to provider  

## 2022-02-10 NOTE — Telephone Encounter (Signed)
PT,  Cherly Anderson called back to report that BP 30 minutes after pt took medications was   160/82. With that bp pt should be seen within 3 days.   PT has appt this afternoon.   Reason for Disposition  Systolic BP  >= 742 OR Diastolic >= 595  Protocols used: Blood Pressure - High-A-AH

## 2022-02-10 NOTE — Telephone Encounter (Signed)
Called and LVM giving verbal orders per Dr. Wynetta Emery.

## 2022-02-10 NOTE — Patient Instructions (Addendum)
Think about if you're OK for me to send your medicines to [facility] so I can have them packaged in morning, afternoon and evening medicines. We'll talk about it next week.

## 2022-02-10 NOTE — Progress Notes (Signed)
BP (!) 174/78   Pulse 65   Temp 98.1 F (36.7 C) (Oral)   Wt 235 lb 9.6 oz (106.9 kg)   SpO2 98%   BMI 32.86 kg/m    Subjective:    Patient ID: Justin Griffith, male    DOB: 09/13/1940, 81 y.o.   MRN: [id number]  HPI: Justin Griffith is a 81 y.o. male  Chief Complaint  Patient presents with   Hypertension   HYPERTENSION  Hypertension status: better  Satisfied with current treatment? no Duration of hypertension: chronic BP monitoring frequency:  not checking BP medication side effects:  no Medication compliance: good compliance Previous BP meds:hydralazine, clonidine, losartan (not currently) Aspirin: no Recurrent headaches: no Visual changes: no Palpitations: no Dyspnea: no Chest pain: no Lower extremity edema: no Dizzy/lightheaded: no  Relevant past medical, surgical, family and social history reviewed and updated as indicated. Interim medical history since our last visit reviewed. Allergies and medications reviewed and updated.  Review of Systems  Constitutional: Negative.   Respiratory: Negative.    Cardiovascular: Negative.   Gastrointestinal: Negative.   Musculoskeletal: Negative.   Skin:  Positive for wound. Negative for color change, pallor and rash.  Psychiatric/Behavioral:  Positive for confusion. Negative for agitation, behavioral problems, decreased concentration, dysphoric mood, hallucinations, self-injury, sleep disturbance and suicidal ideas. The patient is not nervous/anxious and is not hyperactive.     Per HPI unless specifically indicated above     Objective:    BP (!) 174/78   Pulse 65   Temp 98.1 F (36.7 C) (Oral)   Wt 235 lb 9.6 oz (106.9 kg)   SpO2 98%   BMI 32.86 kg/m   Wt Readings from Last 3 Encounters:  02/10/22 235 lb 9.6 oz (106.9 kg)  02/04/22 227 lb (103 kg)  02/01/22 227 lb 1.2 oz (103 kg)    Physical Exam Vitals and nursing note reviewed.  Constitutional:      General: He is not in acute distress.    Appearance:  Normal appearance. He is not ill-appearing, toxic-appearing or diaphoretic.  HENT:     Head: Normocephalic and atraumatic.     Right Ear: External ear normal.     Left Ear: External ear normal.     Nose: Nose normal.     Mouth/Throat:     Mouth: Mucous membranes are moist.     Pharynx: Oropharynx is clear.  Eyes:     General: No scleral icterus.       Right eye: No discharge.        Left eye: No discharge.     Extraocular Movements: Extraocular movements intact.     Conjunctiva/sclera: Conjunctivae normal.     Pupils: Pupils are equal, round, and reactive to light.  Cardiovascular:     Rate and Rhythm: Normal rate and regular rhythm.     Pulses: Normal pulses.     Heart sounds: Normal heart sounds. No murmur heard.    No friction rub. No gallop.  Pulmonary:     Effort: Pulmonary effort is normal. No respiratory distress.     Breath sounds: Normal breath sounds. No stridor. No wheezing, rhonchi or rales.  Chest:     Chest wall: No tenderness.  Musculoskeletal:        General: Normal range of motion.     Cervical back: Normal range of motion and neck supple.  Skin:    General: Skin is warm and dry.     Capillary Refill: Capillary refill takes  less than 2 seconds.     Coloration: Skin is not jaundiced or pale.     Findings: No bruising, erythema, lesion or rash.     Comments: Wound on bottom improving. Redressed today.  Neurological:     General: No focal deficit present.     Mental Status: He is alert and oriented to person, place, and time. Mental status is at baseline.  Psychiatric:        Mood and Affect: Mood normal.        Behavior: Behavior normal.        Thought Content: Thought content normal.        Judgment: Judgment normal.     Results for orders placed or performed in visit on 02/04/22  Urine Culture   Specimen: Urine   UR  Result Value Ref Range   Urine Culture, Routine Final report    Organism ID, Bacteria No growth   Microscopic Examination   Urine   Result Value Ref Range   WBC, UA 0-5 0 - 5 /hpf   RBC, Urine 3-10 (A) 0 - 2 /hpf   Epithelial Cells (non renal) 0-10 0 - 10 /hpf   Mucus, UA Present (A) Not Estab.   Bacteria, UA None seen None seen/Few  Comprehensive metabolic panel  Result Value Ref Range   Glucose 180 (H) 70 - 99 mg/dL   BUN 11 8 - 27 mg/dL   Creatinine, Ser 1.21 0.76 - 1.27 mg/dL   eGFR 60 >59 mL/min/1.73   BUN/Creatinine Ratio 9 (L) 10 - 24   Sodium 141 134 - 144 mmol/L   Potassium 3.5 3.5 - 5.2 mmol/L   Chloride 101 96 - 106 mmol/L   CO2 25 20 - 29 mmol/L   Calcium 10.2 8.6 - 10.2 mg/dL   Total Protein 6.6 6.0 - 8.5 g/dL   Albumin 3.4 (L) 3.7 - 4.7 g/dL   Globulin, Total 3.2 1.5 - 4.5 g/dL   Albumin/Globulin Ratio 1.1 (L) 1.2 - 2.2   Bilirubin Total 0.3 0.0 - 1.2 mg/dL   Alkaline Phosphatase 63 44 - 121 IU/L   AST 12 0 - 40 IU/L   ALT 9 0 - 44 IU/L  CBC with Differential/Platelet  Result Value Ref Range   WBC 7.0 3.4 - 10.8 x10E3/uL   RBC 4.20 4.14 - 5.80 x10E6/uL   Hemoglobin 12.4 (L) 13.0 - 17.7 g/dL   Hematocrit 37.7 37.5 - 51.0 %   MCV 90 79 - 97 fL   MCH 29.5 26.6 - 33.0 pg   MCHC 32.9 31.5 - 35.7 g/dL   RDW 13.8 11.6 - 15.4 %   Platelets 317 150 - 450 x10E3/uL   Neutrophils 70 Not Estab. %   Lymphs 20 Not Estab. %   Monocytes 7 Not Estab. %   Eos 2 Not Estab. %   Basos 1 Not Estab. %   Neutrophils Absolute 4.9 1.4 - 7.0 x10E3/uL   Lymphocytes Absolute 1.4 0.7 - 3.1 x10E3/uL   Monocytes Absolute 0.5 0.1 - 0.9 x10E3/uL   EOS (ABSOLUTE) 0.1 0.0 - 0.4 x10E3/uL   Basophils Absolute 0.1 0.0 - 0.2 x10E3/uL   Immature Granulocytes 0 Not Estab. %   Immature Grans (Abs) 0.0 0.0 - 0.1 x10E3/uL  Lipid Panel w/o Chol/HDL Ratio  Result Value Ref Range   Cholesterol, Total 252 (H) 100 - 199 mg/dL   Triglycerides 235 (H) 0 - 149 mg/dL   HDL 39 (L) >39 mg/dL   VLDL Cholesterol Cal  44 (H) 5 - 40 mg/dL   LDL Chol Calc (NIH) 169 (H) 0 - 99 mg/dL  TSH  Result Value Ref Range   TSH 4.170 0.450 -  4.500 uIU/mL  Bayer DCA Hb A1c Waived  Result Value Ref Range   HB A1C (BAYER DCA - WAIVED) 6.3 (H) 4.8 - 5.6 %  B12  Result Value Ref Range   Vitamin B-12 792 232 - 1,245 pg/mL  Urinalysis, Routine w reflex microscopic  Result Value Ref Range   Specific Gravity, UA 1.020 1.005 - 1.030   pH, UA 7.0 5.0 - 7.5   Color, UA Yellow Yellow   Appearance Ur Clear Clear   Leukocytes,UA Trace (A) Negative   Protein,UA 3+ (A) Negative/Trace   Glucose, UA Trace (A) Negative   Ketones, UA Negative Negative   RBC, UA 2+ (A) Negative   Bilirubin, UA Negative Negative   Urobilinogen, Ur 0.2 0.2 - 1.0 mg/dL   Nitrite, UA Negative Negative   Microscopic Examination See below:   Microalbumin, Urine Waived  Result Value Ref Range   Microalb, Ur Waived 150 (H) 0 - 19 mg/L   Creatinine, Urine Waived 50 10 - 300 mg/dL   Microalb/Creat Ratio >300 (H) <30 mg/g      Assessment & Plan:   Problem List Items Addressed This Visit       Genitourinary   Benign hypertensive renal disease - Primary    BP improved, but not at goal- will give him 1 more week on current dose, if still high, will get him back on losartan. Recheck 1 week. Discussed getting his medications pill packed to help with medication adherence. He will consider.         Other   HLD (hyperlipidemia)   Relevant Medications   apixaban (ELIQUIS) 2.5 MG TABS tablet   cloNIDine (CATAPRES) 0.1 MG tablet   hydrALAZINE (APRESOLINE) 50 MG tablet   rosuvastatin (CRESTOR) 10 MG tablet     Follow up plan: Return in about 1 week (around 02/17/2022).

## 2022-02-10 NOTE — Telephone Encounter (Signed)
ERROR

## 2022-02-10 NOTE — Telephone Encounter (Signed)
OK - thanks

## 2022-02-10 NOTE — Telephone Encounter (Signed)
OK for verbal orders?

## 2022-02-11 ENCOUNTER — Telehealth: Payer: Self-pay

## 2022-02-11 MED ORDER — DAKINS (1/2 STRENGTH) 0.25 % EX SOLN: 1.0000 | Freq: Once | CUTANEOUS | 3 refills | Status: AC

## 2022-02-11 NOTE — Telephone Encounter (Signed)
Spoke with Anelisa, the [facility] nurse, and she stated that she will order the supplies needed for wound care but would like to have Dr. Wynetta Emery order the [facility] solution

## 2022-02-11 NOTE — Progress Notes (Signed)
  Chronic Care Management   Note  02/11/2022 Name: Justin Griffith MRN: [id number] DOB: [birth year]  Justin Griffith is a 81 y.o. year old male who is a primary care patient of Valerie Roys, DO. I reached out to [name] by phone today in response to a referral sent by Justin Griffith.  Justin Griffith  agreedto scheduling an appointment with the CCM RN Case Manager   Follow up plan: Patient agreed to scheduled appointment with RN Case Manager on 03/03/2022(date/time).   Noreene Larsson, [place], [place] [address] Direct Dial: [phone] [email or web address]

## 2022-02-11 NOTE — Telephone Encounter (Signed)
Anelisa called to speak with Tammy/ she stated that when she went out to the home of the pt there was another [facility] agency there/ pt has [facility] with [facility] / he was signed up by a nurse named Mitzi Hansen / please advise

## 2022-02-11 NOTE — Telephone Encounter (Signed)
Spoke Sonya from patient Justin Griffith and she says she is happy to do what is requested from the provider, but she states the problems is she can only do so much as the patient seems to be demented and his wife is demented as well and they are not able to remember nor see how to take care of the patient's wound. Davy Pique says she has been seeing the patient up to twice a week, but she says she can see him only what Medicare covers and allows. Please advise?

## 2022-02-16 ENCOUNTER — Encounter: Payer: Self-pay | Admitting: Family Medicine

## 2022-02-16 NOTE — Assessment & Plan Note (Signed)
BP improved, but not at goal- will give him 1 more week on current dose, if still high, will get him back on losartan. Recheck 1 week. Discussed getting his medications pill packed to help with medication adherence. He will consider.

## 2022-02-16 NOTE — Telephone Encounter (Signed)
I think this was already done- but 2x a week is fine

## 2022-02-17 ENCOUNTER — Ambulatory Visit: Payer: HMO | Admitting: Family Medicine

## 2022-02-18 ENCOUNTER — Encounter: Payer: Self-pay | Admitting: Nurse Practitioner

## 2022-02-18 ENCOUNTER — Ambulatory Visit (INDEPENDENT_AMBULATORY_CARE_PROVIDER_SITE_OTHER): Payer: HMO | Admitting: Nurse Practitioner

## 2022-02-18 VITALS — BP 144/68 | HR 76 | Temp 97.6°F | Resp 18 | Wt 234.0 lb

## 2022-02-18 DIAGNOSIS — L89150 Pressure ulcer of sacral region, unstageable: Secondary | ICD-10-CM | POA: Diagnosis not present

## 2022-02-18 MED ORDER — INCRUSE ELLIPTA 62.5 MCG/ACT IN AEPB: 1.0000 | INHALATION_SPRAY | Freq: Every day | RESPIRATORY_TRACT | 3 refills | Status: DC

## 2022-02-18 NOTE — Progress Notes (Signed)
BP (!) 144/68 (BP Location: Left Arm, Patient Position: Sitting, Cuff Size: Large)   Pulse 76   Temp 97.6 F (36.4 C) (Oral)   Resp 18   Wt 234 lb (106.1 kg)   SpO2 92%   BMI 32.64 kg/m    Subjective:    Patient ID: Justin Griffith, male    DOB: 1941-01-19, 81 y.o.   MRN: [id number]  HPI: Justin Griffith is a 81 y.o. male  Chief Complaint  Patient presents with   Wound Check   WOUND CHECK Seen on 02/10/22 last for this and home health wound care ordered, he reports they came out one time, but has not seen them since.  Reports they came out Friday and have not seen since.  He states they have been using large tape pieces and the area is not big, states the last time the tape "stocked his a-hole up" and "could not take a crap".  He would like smaller tape on area, so does not get in way.  Reports needs refill on small bandages.   Duration: months -- he reports 3 months, was in rehab and treated there Location: to coccyx History of trauma in area: no Pain:  occasional Redness: yes Swelling: no Oozing: no Pus: no Fevers: no Nausea/vomiting: no Status: fluctuating Treatments attempted: wound care   Tetanus: UTD   Relevant past medical, surgical, family and social history reviewed and updated as indicated. Interim medical history since our last visit reviewed. Allergies and medications reviewed and updated.  Review of Systems  Constitutional:  Negative for activity change, appetite change, diaphoresis, fatigue and fever.  Respiratory:  Negative for cough, chest tightness, shortness of breath and wheezing.   Cardiovascular: Negative.   Skin:  Positive for wound.  Neurological: Negative.   Psychiatric/Behavioral: Negative.     Per HPI unless specifically indicated above     Objective:    BP (!) 144/68 (BP Location: Left Arm, Patient Position: Sitting, Cuff Size: Large)   Pulse 76   Temp 97.6 F (36.4 C) (Oral)   Resp 18   Wt 234 lb (106.1 kg)   SpO2 92%   BMI 32.64  kg/m   Wt Readings from Last 3 Encounters:  02/18/22 234 lb (106.1 kg)  02/10/22 235 lb 9.6 oz (106.9 kg)  02/04/22 227 lb (103 kg)    Physical Exam Vitals and nursing note reviewed.  Constitutional:      General: He is awake. He is not in acute distress.    Appearance: He is well-developed and well-groomed. He is obese. He is not ill-appearing.  HENT:     Head: Normocephalic and atraumatic.     Right Ear: Hearing, tympanic membrane, ear canal and external ear normal. No drainage.     Left Ear: Hearing, tympanic membrane, ear canal and external ear normal. No drainage.     Mouth/Throat:     Pharynx: Uvula midline. No pharyngeal swelling, oropharyngeal exudate or posterior oropharyngeal erythema.  Eyes:     General: Lids are normal.        Right eye: No discharge.        Left eye: No discharge.     Conjunctiva/sclera: Conjunctivae normal.     Pupils: Pupils are equal, round, and reactive to light.  Neck:     Thyroid: No thyromegaly.     Vascular: No carotid bruit or JVD.  Cardiovascular:     Rate and Rhythm: Normal rate and regular rhythm.     Heart  sounds: S1 normal and S2 normal. Murmur heard.     Systolic murmur is present with a grade of 3/6.     No gallop.  Pulmonary:     Effort: Pulmonary effort is normal. No accessory muscle usage or respiratory distress.     Breath sounds: No decreased breath sounds or wheezing.  Abdominal:     General: Bowel sounds are normal.     Palpations: Abdomen is soft. There is no hepatomegaly or splenomegaly.     Tenderness: There is no abdominal tenderness.  Musculoskeletal:        General: Normal range of motion.     Cervical back: Normal range of motion and neck supple.     Right lower leg: Edema (trace) present.     Left lower leg: Edema (trace) present.  Lymphadenopathy:     Cervical: No cervical adenopathy.  Skin:    General: Skin is warm and dry.     Capillary Refill: Capillary refill takes less than 2 seconds.     Findings:  Wound present.          Comments: Rubor and xerosis bilateral lower extremity.  Upper gluteal fold aspect with moderate erythema all around.  Neurological:     Mental Status: He is alert and oriented to person, place, and time.  Psychiatric:        Mood and Affect: Mood normal.        Behavior: Behavior normal. Behavior is cooperative.        Thought Content: Thought content normal.        Judgment: Judgment normal.    Results for orders placed or performed in visit on 02/04/22  Urine Culture   Specimen: Urine   UR  Result Value Ref Range   Urine Culture, Routine Final report    Organism ID, Bacteria No growth   Microscopic Examination   Urine  Result Value Ref Range   WBC, UA 0-5 0 - 5 /hpf   RBC, Urine 3-10 (A) 0 - 2 /hpf   Epithelial Cells (non renal) 0-10 0 - 10 /hpf   Mucus, UA Present (A) Not Estab.   Bacteria, UA None seen None seen/Few  Comprehensive metabolic panel  Result Value Ref Range   Glucose 180 (H) 70 - 99 mg/dL   BUN 11 8 - 27 mg/dL   Creatinine, Ser 1.21 0.76 - 1.27 mg/dL   eGFR 60 >59 mL/min/1.73   BUN/Creatinine Ratio 9 (L) 10 - 24   Sodium 141 134 - 144 mmol/L   Potassium 3.5 3.5 - 5.2 mmol/L   Chloride 101 96 - 106 mmol/L   CO2 25 20 - 29 mmol/L   Calcium 10.2 8.6 - 10.2 mg/dL   Total Protein 6.6 6.0 - 8.5 g/dL   Albumin 3.4 (L) 3.7 - 4.7 g/dL   Globulin, Total 3.2 1.5 - 4.5 g/dL   Albumin/Globulin Ratio 1.1 (L) 1.2 - 2.2   Bilirubin Total 0.3 0.0 - 1.2 mg/dL   Alkaline Phosphatase 63 44 - 121 IU/L   AST 12 0 - 40 IU/L   ALT 9 0 - 44 IU/L  CBC with Differential/Platelet  Result Value Ref Range   WBC 7.0 3.4 - 10.8 x10E3/uL   RBC 4.20 4.14 - 5.80 x10E6/uL   Hemoglobin 12.4 (L) 13.0 - 17.7 g/dL   Hematocrit 37.7 37.5 - 51.0 %   MCV 90 79 - 97 fL   MCH 29.5 26.6 - 33.0 pg   MCHC 32.9 31.5 - 35.7  g/dL   RDW 13.8 11.6 - 15.4 %   Platelets 317 150 - 450 x10E3/uL   Neutrophils 70 Not Estab. %   Lymphs 20 Not Estab. %   Monocytes 7 Not  Estab. %   Eos 2 Not Estab. %   Basos 1 Not Estab. %   Neutrophils Absolute 4.9 1.4 - 7.0 x10E3/uL   Lymphocytes Absolute 1.4 0.7 - 3.1 x10E3/uL   Monocytes Absolute 0.5 0.1 - 0.9 x10E3/uL   EOS (ABSOLUTE) 0.1 0.0 - 0.4 x10E3/uL   Basophils Absolute 0.1 0.0 - 0.2 x10E3/uL   Immature Granulocytes 0 Not Estab. %   Immature Grans (Abs) 0.0 0.0 - 0.1 x10E3/uL  Lipid Panel w/o Chol/HDL Ratio  Result Value Ref Range   Cholesterol, Total 252 (H) 100 - 199 mg/dL   Triglycerides 235 (H) 0 - 149 mg/dL   HDL 39 (L) >39 mg/dL   VLDL Cholesterol Cal 44 (H) 5 - 40 mg/dL   LDL Chol Calc (NIH) 169 (H) 0 - 99 mg/dL  TSH  Result Value Ref Range   TSH 4.170 0.450 - 4.500 uIU/mL  Bayer DCA Hb A1c Waived  Result Value Ref Range   HB A1C (BAYER DCA - WAIVED) 6.3 (H) 4.8 - 5.6 %  B12  Result Value Ref Range   Vitamin B-12 792 232 - 1,245 pg/mL  Urinalysis, Routine w reflex microscopic  Result Value Ref Range   Specific Gravity, UA 1.020 1.005 - 1.030   pH, UA 7.0 5.0 - 7.5   Color, UA Yellow Yellow   Appearance Ur Clear Clear   Leukocytes,UA Trace (A) Negative   Protein,UA 3+ (A) Negative/Trace   Glucose, UA Trace (A) Negative   Ketones, UA Negative Negative   RBC, UA 2+ (A) Negative   Bilirubin, UA Negative Negative   Urobilinogen, Ur 0.2 0.2 - 1.0 mg/dL   Nitrite, UA Negative Negative   Microscopic Examination See below:   Microalbumin, Urine Waived  Result Value Ref Range   Microalb, Ur Waived 150 (H) 0 - 19 mg/L   Creatinine, Urine Waived 50 10 - 300 mg/dL   Microalb/Creat Ratio >300 (H) <30 mg/g      Assessment & Plan:   Problem List Items Addressed This Visit       Other   Pressure injury of sacral region, unstageable ([place]) - Primary    Ongoing to left upper sacral area.  No dressing in place on visit today.  With CMA assist triple abx applied and dressing with non stick and tape placed.  Will have CMA look into home health, as need wound care nurse in home, patient unable to  make visits outpatient to a wound care center due to overall health.  Suspect his spouse is not performing dressing changes at home, as no dressing in place today.  Discussed with patient risk of worsening wound if no proper care present in home setting.  Return in 5 days for wound check.        Follow up plan: Return in about 5 days (around 02/23/2022) for Wound Check.

## 2022-02-18 NOTE — Assessment & Plan Note (Signed)
Ongoing to left upper sacral area.  No dressing in place on visit today.  With CMA assist triple abx applied and dressing with non stick and tape placed.  Will have CMA look into home health, as need wound care nurse in home, patient unable to make visits outpatient to a wound care center due to overall health.  Suspect his spouse is not performing dressing changes at home, as no dressing in place today.  Discussed with patient risk of worsening wound if no proper care present in home setting.  Return in 5 days for wound check.

## 2022-02-18 NOTE — Patient Instructions (Signed)

## 2022-02-19 ENCOUNTER — Telehealth: Payer: Self-pay | Admitting: Family Medicine

## 2022-02-19 ENCOUNTER — Telehealth: Payer: Self-pay

## 2022-02-19 NOTE — Telephone Encounter (Signed)
Spoke with Home health nurse Maudie Mercury about verbal orders from Dr. Wynetta Emery for patient's wound care. Informed Kim that Dr. Wynetta Emery wants to have the wound cleaned with Dakins solution, PolyMem w/silver placed on wound and then have a Mepilex sacral bandage placed. This can be changed optimally 3 times weekly but can happen twice weekly. Kim verbalized understanding. Maudie Mercury also stated that she is going out to see patient today.

## 2022-02-23 ENCOUNTER — Ambulatory Visit (INDEPENDENT_AMBULATORY_CARE_PROVIDER_SITE_OTHER): Payer: HMO | Admitting: Family Medicine

## 2022-02-23 ENCOUNTER — Encounter: Payer: Self-pay | Admitting: Family Medicine

## 2022-02-23 ENCOUNTER — Ambulatory Visit: Payer: Self-pay | Admitting: *Deleted

## 2022-02-23 ENCOUNTER — Telehealth: Payer: Self-pay | Admitting: Family Medicine

## 2022-02-23 VITALS — BP 170/92 | HR 77 | Temp 97.9°F | Ht 71.0 in | Wt 226.5 lb

## 2022-02-23 DIAGNOSIS — I129 Hypertensive chronic kidney disease with stage 1 through stage 4 chronic kidney disease, or unspecified chronic kidney disease: Secondary | ICD-10-CM

## 2022-02-23 DIAGNOSIS — E1122 Type 2 diabetes mellitus with diabetic chronic kidney disease: Secondary | ICD-10-CM

## 2022-02-23 DIAGNOSIS — E785 Hyperlipidemia, unspecified: Secondary | ICD-10-CM

## 2022-02-23 DIAGNOSIS — L89150 Pressure ulcer of sacral region, unstageable: Secondary | ICD-10-CM | POA: Diagnosis not present

## 2022-02-23 DIAGNOSIS — F03A0 Unspecified dementia, mild, without behavioral disturbance, psychotic disturbance, mood disturbance, and anxiety: Secondary | ICD-10-CM

## 2022-02-23 MED ORDER — ASCORBIC ACID 500 MG PO TABS: 500.0000 mg | ORAL_TABLET | Freq: Every day | ORAL | 0 refills | Status: DC

## 2022-02-23 MED ORDER — APIXABAN 2.5 MG PO TABS: 2.5000 mg | ORAL_TABLET | Freq: Two times a day (BID) | ORAL | 1 refills | Status: DC

## 2022-02-23 MED ORDER — OMEPRAZOLE 40 MG PO CPDR: 40.0000 mg | DELAYED_RELEASE_CAPSULE | Freq: Every day | ORAL | 1 refills | Status: DC

## 2022-02-23 MED ORDER — ROSUVASTATIN CALCIUM 10 MG PO TABS: 10.0000 mg | ORAL_TABLET | Freq: Every day | ORAL | 1 refills | Status: DC

## 2022-02-23 MED ORDER — ADULT MULTIVITAMIN W/MINERALS CH: 1.0000 | ORAL_TABLET | Freq: Every day | ORAL | 2 refills | Status: DC

## 2022-02-23 MED ORDER — GABAPENTIN 100 MG PO CAPS: 100.0000 mg | ORAL_CAPSULE | Freq: Three times a day (TID) | ORAL | 1 refills | Status: DC

## 2022-02-23 MED ORDER — CLONIDINE HCL 0.1 MG PO TABS: 0.1000 mg | ORAL_TABLET | Freq: Two times a day (BID) | ORAL | 1 refills | Status: DC

## 2022-02-23 MED ORDER — HYDRALAZINE HCL 50 MG PO TABS: ORAL_TABLET | ORAL | 1 refills | Status: DC

## 2022-02-23 NOTE — Telephone Encounter (Signed)
Reason for Disposition  [9] Systolic BP  >= 242 OR Diastolic >= 683 AND [4] having NO cardiac or neurologic symptoms    Has 3:20 appt. Today with Dr. Wynetta Emery  Answer Assessment - Initial Assessment Questions 1. BLOOD PRESSURE: "What is the blood pressure?" "Did you take at least two measurements 5 minutes apart?"     230/100 with a headache.    Michael with [facility].   He just took his BP medication.   He had not taken it before BP taken. 11/30 160/86   11/27 178/74   11/21 200/90 He has a visit today at 3:20.    2. ONSET: "When did you take your blood pressure?"     Therapist took it this morning.   Therapist during home health visit 3. HOW: "How did you take your blood pressure?" (e.g., automatic home BP monitor, visiting nurse)     Not asked 4. HISTORY: "Do you have a history of high blood pressure?"     Yes 5. MEDICINES: "Are you taking any medicines for blood pressure?" "Have you missed any doses recently?"     Yes takes medication.   He has missed some doses because he forgets to take it.   This morning he had not taken his medicine prior to Legrand Como coming in.   She did have him go a head and take it. 6. OTHER SYMPTOMS: "Do you have any symptoms?" (e.g., blurred vision, chest pain, difficulty breathing, headache, weakness)     "Just a mild headache" per pt in background.  I went over s/s to call 911 if they occur.   Sudden bad headache, slurred speech, numbness/weakness one side of body call 911. 7. PREGNANCY: "Is there any chance you are pregnant?" "When was your last menstrual period?"     N/A  Protocols used: Blood Pressure - High-A-AH

## 2022-02-23 NOTE — Telephone Encounter (Signed)
Copied from [place] [id number]. Topic: General - Inquiry >> Feb 23, 2022  4:13 PM Chapman Fitch wrote: Reason for CRM: Pt is new to [facility] packaging and they need his med list faxed over / fax# [phone]/ Attention Leafy Ro /please advise

## 2022-02-23 NOTE — Telephone Encounter (Signed)
  Chief Complaint: Therapist, Legrand Como with [facility] called in a BP of 230/100.  Pt has an appt. Today with Dr. Wynetta Emery at 3:20. Symptoms: "Just a mild headache" per pt. Frequency: Legrand Como looking in record notes having elevated BP several times. Pertinent Negatives: Patient denies any symptoms other than the mild headache. Disposition: '[]'$ ED /'[]'$ Urgent Care (no appt availability in office) / '[x]'$ Appointment(In office/virtual)/ '[]'$  [facility]/ '[]'$ Home Care/ '[]'$ Refused Recommended Disposition /'[]'$ [facility]/ '[]'$  Follow-up with PCP Additional Notes: This information sent to Dr. Park Liter for his appt. This evening at 3:20.

## 2022-02-23 NOTE — Telephone Encounter (Signed)
Requested medication list has been faxed to [facility] per request.

## 2022-02-23 NOTE — Progress Notes (Signed)
BP (!) 214/77   Pulse 77   Temp 97.9 F (36.6 C) (Oral)   Ht _0  (1.803 m)   Wt 226 lb 8 oz (102.7 kg)   SpO2 95%   BMI 31.59 kg/m    Subjective:    Patient ID: Justin Griffith, male    DOB: 08-08-1940, 81 y.o.   MRN: [id number]  HPI: Justin Griffith is a 81 y.o. male  Chief Complaint  Patient presents with  . Wound Check  . Hypertension   HYPERTENSION  Hypertension status: {Blank single:19197::"controlled","uncontrolled","better","worse","exacerbated","stable"}  Satisfied with current treatment? {Blank single:19197::"yes","no"} Duration of hypertension: {Blank single:19197::"chronic","months","years"} BP monitoring frequency:  {Blank single:19197::"not checking","rarely","daily","weekly","monthly","a few times a day","a few times a week","a few times a month"} BP range:  BP medication side effects:  {Blank single:19197::"yes","no"} Medication compliance: {Blank single:19197::"excellent compliance","good compliance","fair compliance","poor compliance"} Previous BP meds:losartan, hydralazine Aspirin: {Blank single:19197::"yes","no"} Recurrent headaches: {Blank single:19197::"yes","no"} Visual changes: {Blank single:19197::"yes","no"} Palpitations: {Blank single:19197::"yes","no"} Dyspnea: {Blank single:19197::"yes","no"} Chest pain: {Blank single:19197::"yes","no"} Lower extremity edema: {Blank single:19197::"yes","no"} Dizzy/lightheaded: {Blank single:19197::"yes","no"}   Relevant past medical, surgical, family and social history reviewed and updated as indicated. Interim medical history since our last visit reviewed. Allergies and medications reviewed and updated.  Review of Systems  Per HPI unless specifically indicated above     Objective:    BP (!) 214/77   Pulse 77   Temp 97.9 F (36.6 C) (Oral)   Ht _1  (1.803 m)   Wt 226 lb 8 oz (102.7 kg)   SpO2 95%   BMI 31.59 kg/m   Wt Readings from Last 3 Encounters:  02/23/22 226 lb 8 oz (102.7 kg)   02/18/22 234 lb (106.1 kg)  02/10/22 235 lb 9.6 oz (106.9 kg)    Physical Exam  Results for orders placed or performed in visit on 02/04/22  Urine Culture   Specimen: Urine   UR  Result Value Ref Range   Urine Culture, Routine Final report    Organism ID, Bacteria No growth   Microscopic Examination   Urine  Result Value Ref Range   WBC, UA 0-5 0 - 5 /hpf   RBC, Urine 3-10 (A) 0 - 2 /hpf   Epithelial Cells (non renal) 0-10 0 - 10 /hpf   Mucus, UA Present (A) Not Estab.   Bacteria, UA None seen None seen/Few  Comprehensive metabolic panel  Result Value Ref Range   Glucose 180 (H) 70 - 99 mg/dL   BUN 11 8 - 27 mg/dL   Creatinine, Ser 1.21 0.76 - 1.27 mg/dL   eGFR 60 >59 mL/min/1.73   BUN/Creatinine Ratio 9 (L) 10 - 24   Sodium 141 134 - 144 mmol/L   Potassium 3.5 3.5 - 5.2 mmol/L   Chloride 101 96 - 106 mmol/L   CO2 25 20 - 29 mmol/L   Calcium 10.2 8.6 - 10.2 mg/dL   Total Protein 6.6 6.0 - 8.5 g/dL   Albumin 3.4 (L) 3.7 - 4.7 g/dL   Globulin, Total 3.2 1.5 - 4.5 g/dL   Albumin/Globulin Ratio 1.1 (L) 1.2 - 2.2   Bilirubin Total 0.3 0.0 - 1.2 mg/dL   Alkaline Phosphatase 63 44 - 121 IU/L   AST 12 0 - 40 IU/L   ALT 9 0 - 44 IU/L  CBC with Differential/Platelet  Result Value Ref Range   WBC 7.0 3.4 - 10.8 x10E3/uL   RBC 4.20 4.14 - 5.80 x10E6/uL   Hemoglobin 12.4 (L) 13.0 - 17.7 g/dL   Hematocrit 37.7  37.5 - 51.0 %   MCV 90 79 - 97 fL   MCH 29.5 26.6 - 33.0 pg   MCHC 32.9 31.5 - 35.7 g/dL   RDW 13.8 11.6 - 15.4 %   Platelets 317 150 - 450 x10E3/uL   Neutrophils 70 Not Estab. %   Lymphs 20 Not Estab. %   Monocytes 7 Not Estab. %   Eos 2 Not Estab. %   Basos 1 Not Estab. %   Neutrophils Absolute 4.9 1.4 - 7.0 x10E3/uL   Lymphocytes Absolute 1.4 0.7 - 3.1 x10E3/uL   Monocytes Absolute 0.5 0.1 - 0.9 x10E3/uL   EOS (ABSOLUTE) 0.1 0.0 - 0.4 x10E3/uL   Basophils Absolute 0.1 0.0 - 0.2 x10E3/uL   Immature Granulocytes 0 Not Estab. %   Immature Grans (Abs) 0.0 0.0 -  0.1 x10E3/uL  Lipid Panel w/o Chol/HDL Ratio  Result Value Ref Range   Cholesterol, Total 252 (H) 100 - 199 mg/dL   Triglycerides 235 (H) 0 - 149 mg/dL   HDL 39 (L) >39 mg/dL   VLDL Cholesterol Cal 44 (H) 5 - 40 mg/dL   LDL Chol Calc (NIH) 169 (H) 0 - 99 mg/dL  TSH  Result Value Ref Range   TSH 4.170 0.450 - 4.500 uIU/mL  Bayer DCA Hb A1c Waived  Result Value Ref Range   HB A1C (BAYER DCA - WAIVED) 6.3 (H) 4.8 - 5.6 %  B12  Result Value Ref Range   Vitamin B-12 792 232 - 1,245 pg/mL  Urinalysis, Routine w reflex microscopic  Result Value Ref Range   Specific Gravity, UA 1.020 1.005 - 1.030   pH, UA 7.0 5.0 - 7.5   Color, UA Yellow Yellow   Appearance Ur Clear Clear   Leukocytes,UA Trace (A) Negative   Protein,UA 3+ (A) Negative/Trace   Glucose, UA Trace (A) Negative   Ketones, UA Negative Negative   RBC, UA 2+ (A) Negative   Bilirubin, UA Negative Negative   Urobilinogen, Ur 0.2 0.2 - 1.0 mg/dL   Nitrite, UA Negative Negative   Microscopic Examination See below:   Microalbumin, Urine Waived  Result Value Ref Range   Microalb, Ur Waived 150 (H) 0 - 19 mg/L   Creatinine, Urine Waived 50 10 - 300 mg/dL   Microalb/Creat Ratio >300 (H) <30 mg/g      Assessment & Plan:   Problem List Items Addressed This Visit   None    Follow up plan: No follow-ups on file.

## 2022-02-24 ENCOUNTER — Telehealth: Payer: Self-pay | Admitting: Family Medicine

## 2022-02-24 DIAGNOSIS — F03A0 Unspecified dementia, mild, without behavioral disturbance, psychotic disturbance, mood disturbance, and anxiety: Secondary | ICD-10-CM | POA: Insufficient documentation

## 2022-02-24 LAB — BASIC METABOLIC PANEL
BUN/Creatinine Ratio: 11 (ref 10–24)
BUN: 11 mg/dL (ref 8–27)
CO2: 22 mmol/L (ref 20–29)
Calcium: 10 mg/dL (ref 8.6–10.2)
Chloride: 104 mmol/L (ref 96–106)
Creatinine, Ser: 1.04 mg/dL (ref 0.76–1.27)
Glucose: 149 mg/dL — ABNORMAL HIGH (ref 70–99)
Potassium: 3.5 mmol/L (ref 3.5–5.2)
Sodium: 140 mmol/L (ref 134–144)
eGFR: 72 mL/min/{1.73_m2} (ref >59)

## 2022-02-24 NOTE — Telephone Encounter (Signed)
Copied from [place] [id number]. Topic: General - Inquiry >> Feb 24, 2022  8:22 AM Chapman Fitch wrote: Reason for CRM: Kim from [facility] home [facility] called and wanted to peak with the nurse about pts appt yesterday and to see if any changes were made/ pt is not able to remember info to tell her about his appt/ she is scheduled to see him at noon today / please advise before the visit

## 2022-02-24 NOTE — Assessment & Plan Note (Signed)
Checking BMP today. Has been off his insulin because we stopped it. Consider oral.

## 2022-02-24 NOTE — Assessment & Plan Note (Signed)
Significantly improved with only a small area open. Continue wound care at home. Call with any concerns.

## 2022-02-24 NOTE — Assessment & Plan Note (Signed)
Broached the topic of getting someone else involved with his care. His sons live in [name]. He is currently not interested in getting help- will monitor for now, but may need to get APS involved if memory issues continue to worsen.

## 2022-02-24 NOTE — Assessment & Plan Note (Signed)
Unclear if he has been taking his medicine. He agreed to pill pack. Will get that set up for him. If BP still markedly elevated at next visit, will restart losartan.

## 2022-02-24 NOTE — Telephone Encounter (Signed)
Spoke with Maudie Mercury from [facility] and clarify the changes that were made at his appointment yesterday. I informed her that Dr.Johnson change patient's prescription to pill pack from his local pharmacy and the process has been started. She wanted provider to know that they are seeing patient every Tuesday and Thursday for wound care.

## 2022-02-25 ENCOUNTER — Encounter: Payer: Self-pay | Admitting: Urology

## 2022-02-25 ENCOUNTER — Other Ambulatory Visit: Payer: Self-pay | Admitting: Urology

## 2022-02-25 ENCOUNTER — Ambulatory Visit (INDEPENDENT_AMBULATORY_CARE_PROVIDER_SITE_OTHER): Payer: HMO | Admitting: Urology

## 2022-02-25 VITALS — Ht 71.0 in

## 2022-02-25 DIAGNOSIS — R310 Gross hematuria: Secondary | ICD-10-CM

## 2022-02-25 DIAGNOSIS — N138 Other obstructive and reflux uropathy: Secondary | ICD-10-CM

## 2022-02-25 DIAGNOSIS — R339 Retention of urine, unspecified: Secondary | ICD-10-CM

## 2022-02-25 DIAGNOSIS — N401 Enlarged prostate with lower urinary tract symptoms: Secondary | ICD-10-CM

## 2022-02-25 NOTE — Progress Notes (Signed)
02/25/2022 1:50 PM   Justin Griffith 1940-06-16 [id number]  Reason for visit: Follow up BPH, urinary retention, history of gross hematuria and recurrent UTI  HPI: Extremely comorbid 81 year old male here for follow-up of BPH with recurrent gross hematuria, recurrent UTIs, and urinary retention currently managed with chronic Foley catheter.  Prostate measures ~175 g on CT.  He had originally opted for HOLEP to address his urinary retention and recurrent gross hematuria, however had a pulmonary embolus 1 week prior to scheduled surgery in June 2023 that required anticoagulation with Eliquis.  He was last seen by urology by Debroah Loop, PA in July 2023 and he was supposed to be scheduled with interventional radiology for prostatic artery embolization to address both his Foley dependent urinary retention and recurrent gross hematuria.  He apparently never followed up for that visit, and would not answer the phone for virtual visits with interventional radiology to discuss prostatic artery embolization.  On chart review, he has had numerous ER visits over the last 4 months with gross hematuria or non-draining Foley(often pulled down into the prostatic fossa on imaging).  His Foley was most recently exchanged in the ER on 02/02/2022(38F 3-way), and he reports he has been doing very well with this catheter in place and urine has remained essentially clear.  It is clear yellow in clinic today.  I offered to exchange his catheter, but he would like to hold off since this catheter has been draining so well.  I again had a very long conversation with him about his BPH with significantly enlarged prostate causing retention and recurrent gross hematuria with his catheter in place, most frequently when this gets pulled into the prostatic fossa based on the CT images from the ER.  We reviewed options including chronic Foley exchanges on a monthly basis which would not be a good option with his numerous  ER visits, prostatic artery embolization, or HOLEP.  He remains on Eliquis from his pulmonary embolus on 09/19/2021.  I do not see any notes from hematology regarding the duration of therapy, but would anticipate this would be at least 6 months.  We reviewed prostatic artery embolization(PAE) is a less invasive procedure with very low risk of incontinence where blood vessels to the prostate are embolized causing shrinkage of the prostate tissue.  This often can result in resuming spontaneous voiding, as well as improving gross hematuria from BPH.  I deferred the exact details and risks of this procedure to the interventional radiologist to discuss with him.  I do think this would be a very reasonable option for him with his comorbidities  We discussed the risks and benefits of HoLEP at length.  The procedure requires general anesthesia and takes 1 to 2 hours, and a holmium laser is used to enucleate the prostate and push this tissue into the bladder.  A morcellator is then used to remove this tissue, which is sent for pathology.  The vast majority(>95%) of patients are able to discharge the same day with a catheter in place for 2 to 3 days, and will follow-up in clinic for a voiding trial.  We specifically discussed the risks of bleeding, infection, retrograde ejaculation, temporary urgency and urge incontinence, very low risk of long-term incontinence, urethral stricture/bladder neck contracture, pathologic evaluation of prostate tissue and possible detection of prostate cancer or other malignancy, and possible need for additional procedures.  He is unwilling to accept the potential risk of incontinence with HOLEP, additionally this would also have to be  delayed at least another month with his requirement for Eliquis for at least 6 months after his pulmonary embolus on 09/19/2021.  He is interested in pursuing prostatic artery embolization.  I discussed his case with the interventional radiologist on-call  today Dr. Serafina Royals, and he will work to get him in to discuss PAE.  I stressed extensively with the patient the importance of meeting with them and following through with PAE, as well as need for voiding trial approximately 1 month after the procedure.  RTC with me 03/09/2022 for catheter change, and confirm set up for [place], MD  [facility] [address] [place], [place] [address] [phone]

## 2022-02-25 NOTE — Patient Instructions (Signed)
Prostate Artery Embolization: A Minimally Invasive Treatment for Enlarged Prostate Prostate artery embolization is a new, innovative, nonsurgical treatment option for men with urinary tract symptoms caused by an enlarged prostate. In clinical studies this procedure has demonstrated excellent outcomes with low risk.  Benign Prostatic Hyperplasia (BPH)  The prostate is a gland located just below the bladder. It surrounds the urethra, the tube that carries urine out of the bladder. As a man ages, the prostate enlarges and often begins to press against the urethra, reducing urine flow. For some men, the reduction in the flow of urine can be significant. Some symptoms include:  Frequent and at times urgent need to urinate Increased frequency of urination at night Difficulty starting urination Weak urine stream or a stream that stops and starts Dribbling at the end of urination Inability to completely empty the bladder  More severe symptoms include:  Urinary tract infections Inability to urinate Blood in the urine  What Is Prostate Artery Embolization? Prostate artery embolization (PAE) is a nonsurgical, minimally invasive treatment. This procedure blocks blood flow to the prostate, causing it to shrink. During the procedure, our radiologists use X-ray imaging to accurately guide the delivery of the PAE treatment to the prostate.  At [facility], PAE is performed by an interventional radiologist--a doctor who is fellowship-trained in the subspecialty of image-guided procedures.  During the procedure, a catheter (a narrow tube) is inserted through the groin or wrist. Our interventional radiologist uses X-ray imaging to guide this catheter into the prostate. Once the radiologist has confirmed that the catheter is in the correct position, tiny beads are sent through the catheter and placed inside the arteries leading to the prostate. The beads restrict blood flow to the prostate and cause it to  shrink. We perform this procedure under "twilight" sedation. This form of sedation allows most patients to go home the same day as the procedure.  Will then be removed about 1 month later to give you a chance to urinate on your own.  Sometimes it can take a few months for the prostate to shrink completely and resume urinating normally.  There is little to no risk of leakage of urine.

## 2022-02-26 ENCOUNTER — Telehealth: Payer: Self-pay | Admitting: Family Medicine

## 2022-02-26 ENCOUNTER — Ambulatory Visit: Payer: HMO | Admitting: Physician Assistant

## 2022-02-26 NOTE — Telephone Encounter (Signed)
IR referral made to consider PAE. Justin Griffith has been notified. Waiting on appointment.

## 2022-02-26 NOTE — Telephone Encounter (Signed)
Patient is scheduled with IR for consultation on 02/27/2022.

## 2022-02-27 ENCOUNTER — Ambulatory Visit
Admission: RE | Admit: 2022-02-27 | Discharge: 2022-02-27 | Disposition: A | Discharge status: Home / Self Care | Payer: HMO | Source: Ambulatory Visit | Attending: Physician Assistant | Admitting: Physician Assistant

## 2022-02-27 ENCOUNTER — Ambulatory Visit: Payer: HMO | Admitting: Physician Assistant

## 2022-02-27 HISTORY — PX: IR RADIOLOGIST EVAL & MGMT: IMG5224

## 2022-02-27 NOTE — Consult Note (Signed)
Chief Complaint: Patient was seen in consultation today for benign prostatic hyperplasia  Referring Physician(s): Nickolas Madrid, MD  History of Present Illness: Justin Griffith is a 81 y.o. male presenting today via virtual telephone consultation at the gracious referral of Dr. Diamantina Providence for management of benign prostatic hyperplasia.  His clinical course has been complicated by recurrent urinary tract infections and gross hematuria in the setting of chronic Foley catheter dependence.  His prostate is reported to measure approximately 175 grams.  Dr. Diamantina Providence had originally planned for HOLEP procedure, however due to hematuria and PE (now on Eliquis) in June 2023, this was deferred.  His current indwelling 22 Fr Foley catheter was last exchanged on 02/02/22 in the emergency department.  He states he is motivated to have his catheter our, but does not want to undergo surgery.  We spoke about the overall periprocedural expectations and steps regarding PAE.      Past Medical History:  Diagnosis Date   Acute urinary retention    Arthritis    Asthma 2000   Back pain    Colon polyp 2012   COPD (chronic obstructive pulmonary disease) (HCC)    Emphysema of lung (HCC)    Heart murmur    Hernia 2000   History of cold sores 05/06/2020   History of colon cancer    Hyperlipidemia    Hypertension    Personal history of malignant neoplasm of large intestine    Personal history of tobacco use, presenting hazards to health    Septic shock ([place]) 11/18/2021   Sleep apnea    Special screening for malignant neoplasms, colon    Type 2 diabetes mellitus with proteinuria ([place]) 10/11/2014    Past Surgical History:  Procedure Laterality Date   CARDIAC CATHETERIZATION Left 09/25/2015   Procedure: Left Heart Cath and Coronary Angiography;  Surgeon: Yolonda Kida, MD;  Location: [place] [facility];  Service: Cardiovascular;  Laterality: Left;   CHOLECYSTECTOMY  1970   COLON SURGERY  07-16-99    sigmoid colon resection with primary anastomosis, chemotherapy for metastatic disease   COLONOSCOPY  2001, 2012   Dr Bary Castilla, tubular adenoma of the cecum and ascending colon in 2012.   COLONOSCOPY WITH PROPOFOL N/A 02/12/2016   Procedure: COLONOSCOPY WITH PROPOFOL;  Surgeon: Robert Bellow, MD;  Location: [facility] ENDOSCOPY;  Service: Endoscopy;  Laterality: N/A;   CORONARY STENT INTERVENTION N/A 05/17/2020   Procedure: CORONARY STENT INTERVENTION;  Surgeon: Yolonda Kida, MD;  Location: [place] [facility];  Service: Cardiovascular;  Laterality: N/A;  LAD   ESOPHAGOGASTRODUODENOSCOPY (EGD) WITH PROPOFOL N/A 02/12/2016   Procedure: ESOPHAGOGASTRODUODENOSCOPY (EGD) WITH PROPOFOL;  Surgeon: Robert Bellow, MD;  Location: [facility] ENDOSCOPY;  Service: Endoscopy;  Laterality: N/A;   HERNIA REPAIR Right    right inguinal hernia repair   JOINT REPLACEMENT Bilateral    knee replacement   KNEE SURGERY Bilateral 2010   LEFT HEART CATH AND CORONARY ANGIOGRAPHY N/A 05/17/2020   Procedure: LEFT HEART CATH AND CORONARY ANGIOGRAPHY possible PCI and stent;  Surgeon: Yolonda Kida, MD;  Location: [place] [facility];  Service: Cardiovascular;  Laterality: N/A;   MYRINGOTOMY WITH TUBE PLACEMENT Bilateral 08/16/2014   Procedure: MYRINGOTOMY WITH TUBE PLACEMENT;  Surgeon: Carloyn Manner, MD;  Location: [facility] ORS;  Service: ENT;  Laterality: Bilateral;   PERIPHERAL VASCULAR CATHETERIZATION Right 09/04/2015   Procedure: Lower Extremity Angiography;  Surgeon: Katha Cabal, MD;  Location: [place] [facility];  Service: Cardiovascular;  Laterality: Right;  PERIPHERAL VASCULAR CATHETERIZATION  09/04/2015   Procedure: Lower Extremity Intervention;  Surgeon: Katha Cabal, MD;  Location: [place] [facility];  Service: Cardiovascular;;   TONSILLECTOMY     TYMPANOSTOMY TUBE PLACEMENT      Allergies: Farxiga [dapagliflozin], Dulaglutide, Liraglutide, and Jardiance  [empagliflozin]  Medications: Prior to Admission medications   Medication Sig Start Date End Date Taking? Authorizing Provider  apixaban (ELIQUIS) 2.5 MG TABS tablet Take 1 tablet (2.5 mg total) by mouth 2 (two) times daily. For blood thinner 02/23/22   Johnson, Megan P, DO  ascorbic acid (VITAMIN C) 500 MG tablet Take 1 tablet (500 mg total) by mouth daily. To keep your immune system up 02/23/22   Park Liter P, DO  blood glucose meter kit and supplies 1 each by Other route daily. Dispense based on patient and insurance preference. Use up to four times daily as directed. (FOR ICD-10 E10.9, E11.9). 02/10/22   Johnson, Megan P, DO  cloNIDine (CATAPRES) 0.1 MG tablet Take 1 tablet (0.1 mg total) by mouth 2 (two) times daily. For blood pressure 02/23/22   Johnson, Megan P, DO  gabapentin (NEURONTIN) 100 MG capsule Take 1 capsule (100 mg total) by mouth 3 (three) times daily. For nerve pain and itching 02/23/22   Park Liter P, DO  hydrALAZINE (APRESOLINE) 50 MG tablet 1 tab 3 times a day for blood pressure 02/23/22   Johnson, Megan P, DO  Multiple Vitamin (MULTIVITAMIN WITH MINERALS) TABS tablet Take 1 tablet by mouth daily. For nutrition 02/23/22   Park Liter P, DO  omeprazole (PRILOSEC) 40 MG capsule Take 1 capsule (40 mg total) by mouth daily. For heartburn 02/23/22   Johnson, Megan P, DO  rosuvastatin (CRESTOR) 10 MG tablet Take 1 tablet (10 mg total) by mouth at bedtime. For cholesterol 02/23/22   Johnson, Megan P, DO  sodium hypochlorite (DAKIN'S 1/2 STRENGTH) external solution Apply 1 Application topically daily at 12 noon. Topically to wound daily to clean. 02/13/22   [provider]  umeclidinium bromide (INCRUSE ELLIPTA) 62.5 MCG/ACT AEPB Inhale 1 puff into the lungs daily. For your breathing 02/18/22   Marnee Guarneri T, NP  Zinc Oxide 10 % OINT Apply topically.    [provider]     Family History  Problem Relation Age of Onset   Diabetes Father    Esophageal  cancer Mother    Alzheimer's disease Paternal Uncle     Social History   Socioeconomic History   Marital status: Married    Spouse name: Not on file   Number of children: Not on file   Years of education: Not on file   Highest education level: GED or equivalent  Occupational History   Occupation: retired    Comment: [employer]  Tobacco Use   Smoking status: Former    Packs/day: 1.00    Years: 30.00    Total pack years: 30.00    Types: Cigarettes    Quit date: 03/23/1989    Years since quitting: 32.9    Passive exposure: Past   Smokeless tobacco: Never  Vaping Use   Vaping Use: Never used  Substance and Sexual Activity   Alcohol use: Griffith    Alcohol/week: 0.0 standard drinks of alcohol   Drug use: Griffith   Sexual activity: Not Currently  Other Topics Concern   Not on file  Social History Narrative   [employer]    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    Social Determinants of Radio broadcast assistant  Strain: Low Risk  (09/01/2021)   Overall Financial Resource Strain (CARDIA)    Difficulty of Paying Living Expenses: Not hard at all  Food Insecurity: Griffith Food Insecurity (12/05/2021)   Hunger Vital Sign    Worried About Running Out of Food in the Last Year: Never true    Ran Out of Food in the Last Year: Never true  Transportation Needs: Griffith Transportation Needs (12/05/2021)   PRAPARE - Hydrologist (Medical): Griffith    Lack of Transportation (Non-Medical): Griffith  Physical Activity: Inactive (09/01/2021)   Exercise Vital Sign    Days of Exercise per Week: 0 days    Minutes of Exercise per Session: 0 min  Stress: Stress Concern Present (09/01/2021)   [place]    Feeling of Stress : To some extent  Social Connections: Socially Isolated (09/01/2021)   Social Connection and Isolation Panel [NHANES]    Frequency of Communication with Friends and Family: Never    Frequency of Social Gatherings with Friends and  Family: Once a week    Attends Religious Services: More than 4 times per year    Active Member of [employer] or Organizations: Griffith    Attends Archivist Meetings: Never    Marital Status: Divorced    Review of Systems: A 12 point ROS discussed and pertinent positives are indicated in the HPI above.  All other systems are negative.   Vital Signs: There were Griffith vitals taken for this visit.  Griffith physical examination was performed in lieu of virtual telephone clinic visit.   Imaging: CT AP 01/30/22   7.7 x 6.7 x 7.9 cm = 213 cc   Labs:  CBC: Recent Labs    12/05/21 0616 12/10/21 1241 01/30/22 0045 02/04/22 1440  WBC 7.3 5.5 8.3 7.0  HGB 11.9* 10.9* 11.7* 12.4*  HCT 36.8* 33.3* 35.7* 37.7  PLT 315 270 276 317    COAGS: Recent Labs    06/25/21 1837 09/19/21 1650 10/12/21 2302 11/17/21 2142 11/18/21 0329 11/18/21 1756 11/19/21 0147 11/20/21 0439  INR 1.1 1.3*  --  2.9* 2.6*  --   --   --   APTT  --  29   < > 44*  --  83* 68* 70*   < > = values in this interval not displayed.    BMP: Recent Labs    12/06/21 0929 12/10/21 1241 01/31/22 0350 02/02/22 0049 02/04/22 1440 02/23/22 1611  NA 140 136 142 138 141 140  K 3.5 4.3 3.1* 3.1* 3.5 3.5  CL 108 106 108 106 101 104  CO2 _0 GLUCOSE 138* 134* 157* 209* 180* 149*  BUN _1 CALCIUM 9.9 9.8 9.5 9.8 10.2 10.0  CREATININE 1.16 1.08 1.06 1.07 1.21 1.04  GFRNONAA >60 >60 >60 >60  --   --     LIVER FUNCTION TESTS: Recent Labs    11/17/21 2142 12/05/21 0616 01/31/22 0350 02/04/22 1440  BILITOT 0.5 0.5 0.5 0.3  AST 26 23 14* 12  ALT _2 ALKPHOS 39 57 47 63  PROT 6.9 7.2 6.0* 6.6  ALBUMIN 3.0* 3.2* 2.8* 3.4*    TUMOR MARKERS: Griffith results for input(s): "AFPTM", "CEA", "CA199", "CHROMGRNA" in the last 8760 hours.  Assessment and Plan: 81 year old male with history of benign prostatic hyperplasia with catheter dependence and hematuria.  He would be  an excellent  candidate for prostate artery embolization.  After brief discussion of PAE, he would like some time to think about having the procedure done over the holidays.  He is mostly hopeful to avoid traveling to [place].  We currently can only perform this procedure at [facility] or [facility] due to requirement for cone beam CT in addition to the other sophisticated endovascular supplies required.  Conceivably, we could request time in the Vascular Surgery endovascular suite at [facility] if traveling to [facility] remains prohibitive for the patient.  He requests we follow up after the new year and he'll give [place] his decision then.  Plan for 1 month follow up telephone visit.   Electronically Signed: Suzette Battiest, MD 02/27/2022, 8:21 AM   I spent a total of  40 Minutes  in virtual telephone clinical consultation, greater than 50% of which was counseling/coordinating care for benign prostatic hyperplasia.

## 2022-03-02 ENCOUNTER — Other Ambulatory Visit: Payer: Self-pay | Admitting: Interventional Radiology

## 2022-03-02 ENCOUNTER — Encounter: Payer: Self-pay | Admitting: Family Medicine

## 2022-03-02 ENCOUNTER — Ambulatory Visit (INDEPENDENT_AMBULATORY_CARE_PROVIDER_SITE_OTHER): Payer: HMO | Admitting: Family Medicine

## 2022-03-02 VITALS — BP 172/84 | Temp 97.4°F | Wt 226.0 lb

## 2022-03-02 DIAGNOSIS — S5001XA Contusion of right elbow, initial encounter: Secondary | ICD-10-CM

## 2022-03-02 DIAGNOSIS — I129 Hypertensive chronic kidney disease with stage 1 through stage 4 chronic kidney disease, or unspecified chronic kidney disease: Secondary | ICD-10-CM

## 2022-03-02 DIAGNOSIS — Z86711 Personal history of pulmonary embolism: Secondary | ICD-10-CM | POA: Diagnosis not present

## 2022-03-02 DIAGNOSIS — K5900 Constipation, unspecified: Secondary | ICD-10-CM

## 2022-03-02 DIAGNOSIS — B372 Candidiasis of skin and nail: Secondary | ICD-10-CM | POA: Diagnosis not present

## 2022-03-02 DIAGNOSIS — N400 Enlarged prostate without lower urinary tract symptoms: Secondary | ICD-10-CM

## 2022-03-02 MED ORDER — POLYETHYLENE GLYCOL 3350 17 GM/SCOOP PO POWD: 17.0000 g | Freq: Two times a day (BID) | ORAL | 1 refills | Status: DC | PRN

## 2022-03-02 MED ORDER — LOSARTAN POTASSIUM 25 MG PO TABS: 25.0000 mg | ORAL_TABLET | Freq: Every day | ORAL | 1 refills | Status: DC

## 2022-03-02 MED ORDER — NYSTATIN 100000 UNIT/GM EX POWD: 1.0000 | Freq: Three times a day (TID) | CUTANEOUS | 0 refills | Status: DC

## 2022-03-02 NOTE — Progress Notes (Unsigned)
   BP (!) 172/84   Temp (!) 97.4 F (36.3 C) (Oral)   Wt 226 lb (102.5 kg)   SpO2 97%   BMI 31.52 kg/m    Subjective:    Patient ID: Justin Griffith, male    DOB: 1940/11/01, 81 y.o.   MRN: [id number]  HPI: Justin Griffith is a 81 y.o. male  Chief Complaint  Patient presents with   Wound Check   Fall    Patient says he fell before his appointment today and he says he would like the provider to take a look at his crotch as he notices a rash or outbreak and his elbow from the fall since earlier.    HYPERTENSION  Hypertension status: {Blank single:19197::"controlled","uncontrolled","better","worse","exacerbated","stable"}  Satisfied with current treatment? {Blank single:19197::"yes","no"} Duration of hypertension: {Blank single:19197::"chronic","months","years"} BP monitoring frequency:  {Blank single:19197::"not checking","rarely","daily","weekly","monthly","a few times a day","a few times a week","a few times a month"} BP range:  BP medication side effects:  {Blank single:19197::"yes","no"} Medication compliance: {Blank single:19197::"excellent compliance","good compliance","fair compliance","poor compliance"} Previous BP meds:{Blank UCJARWPT:00349::"YLTE","IHDTPNSQZY","TMMITVIFXG/XIVHSJWTGR","MBOBOFPU","LGSPJSUNHR","VACQPEAKLT/YVDP","BAQVOHCSPZ (bystolic)","carvedilol","chlorthalidone","clonidine","diltiazem","exforge HCT","HCTZ","irbesartan (avapro)","labetalol","lisinopril","lisinopril-HCTZ","losartan (cozaar)","methyldopa","nifedipine","olmesartan (benicar)","olmesartan-HCTZ","quinapril","ramipril","spironalactone","tekturna","valsartan","valsartan-HCTZ","verapamil"} Aspirin: {Blank single:19197::"yes","no"} Recurrent headaches: {Blank single:19197::"yes","no"} Visual changes: {Blank single:19197::"yes","no"} Palpitations: {Blank single:19197::"yes","no"} Dyspnea: {Blank single:19197::"yes","no"} Chest pain: {Blank single:19197::"yes","no"} Lower extremity edema: {Blank  single:19197::"yes","no"} Dizzy/lightheaded: {Blank single:19197::"yes","no"}   Relevant past medical, surgical, family and social history reviewed and updated as indicated. Interim medical history since our last visit reviewed. Allergies and medications reviewed and updated.  Review of Systems  Constitutional: Negative.   Respiratory: Negative.    Cardiovascular:  Positive for leg swelling. Negative for chest pain and palpitations.  Gastrointestinal:  Positive for anal bleeding (due to constipation) and constipation. Negative for abdominal distention, abdominal pain, blood in stool, diarrhea, nausea, rectal pain and vomiting.  Genitourinary:  Positive for difficulty urinating. Negative for decreased urine volume, dysuria, enuresis, flank pain, frequency, genital sores, hematuria, penile discharge, penile pain, penile swelling, scrotal swelling, testicular pain and urgency.  Musculoskeletal: Negative.   Psychiatric/Behavioral: Negative.      Per HPI unless specifically indicated above     Objective:    BP (!) 172/84   Temp (!) 97.4 F (36.3 C) (Oral)   Wt 226 lb (102.5 kg)   SpO2 97%   BMI 31.52 kg/m   Wt Readings from Last 3 Encounters:  03/02/22 226 lb (102.5 kg)  02/23/22 226 lb 8 oz (102.7 kg)  02/18/22 234 lb (106.1 kg)    Physical Exam  Results for orders placed or performed in visit on 98/02/21  Basic metabolic panel  Result Value Ref Range   Glucose 149 (H) 70 - 99 mg/dL   BUN 11 8 - 27 mg/dL   Creatinine, Ser 1.04 0.76 - 1.27 mg/dL   eGFR 72 >59 mL/min/1.73   BUN/Creatinine Ratio 11 10 - 24   Sodium 140 134 - 144 mmol/L   Potassium 3.5 3.5 - 5.2 mmol/L   Chloride 104 96 - 106 mmol/L   CO2 22 20 - 29 mmol/L   Calcium 10.0 8.6 - 10.2 mg/dL      Assessment & Plan:   Problem List Items Addressed This Visit   None    Follow up plan: No follow-ups on file.

## 2022-03-02 NOTE — Assessment & Plan Note (Signed)
Still running high. To start torsemide through cardiology- will start low dose losartan and recheck 1 week. Call with any concerns.

## 2022-03-02 NOTE — Assessment & Plan Note (Signed)
Has been on eliqus for 5.5 months. Will get him into hematology to determine length of need for anticoagulation. Referral generated today.

## 2022-03-03 ENCOUNTER — Telehealth: Payer: HMO

## 2022-03-03 ENCOUNTER — Ambulatory Visit (INDEPENDENT_AMBULATORY_CARE_PROVIDER_SITE_OTHER): Payer: Self-pay

## 2022-03-03 DIAGNOSIS — I5032 Chronic diastolic (congestive) heart failure: Secondary | ICD-10-CM

## 2022-03-03 DIAGNOSIS — E1122 Type 2 diabetes mellitus with diabetic chronic kidney disease: Secondary | ICD-10-CM

## 2022-03-03 DIAGNOSIS — J449 Chronic obstructive pulmonary disease, unspecified: Secondary | ICD-10-CM

## 2022-03-03 DIAGNOSIS — R296 Repeated falls: Secondary | ICD-10-CM

## 2022-03-03 NOTE — Chronic Care Management (AMB) (Signed)
Chronic Care Management   CCM RN Visit Note  03/03/2022 Name: Justin Griffith MRN: [id number] DOB: 1941-03-23  Subjective: Justin Griffith is a 81 y.o. year old male who is a primary care patient of Valerie Roys, DO. The patient was referred to the Chronic Care Management team for assistance with care management needs subsequent to provider initiation of CCM services and plan of care.    Today's Visit:  Engaged with patient by telephone for initial visit.     SDOH Interventions Today    Flowsheet Row Most Recent Value  SDOH Interventions   Food Insecurity Interventions Intervention Not Indicated  Housing Interventions Intervention Not Indicated  Transportation Interventions Intervention Not Indicated  Utilities Interventions Intervention Not Indicated  Alcohol Usage Interventions Intervention Not Indicated (Score <7)  Financial Strain Interventions Intervention Not Indicated  Physical Activity Interventions Intervention Not Indicated  [no structured activity, impaired balance, encouraged activity]  Stress Interventions Intervention Not Indicated, Other (Comment)  [the patient feels he is doing so much better than he was earlier this year]  Social Connections Interventions Intervention Not Indicated, Other (Comment)  [the patient lives with his wife, has friends and family]         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   Planned  Interventions: Provided patient with basic written and verbal COPD education on self care/management/and exacerbation prevention Advised patient to track and manage COPD triggers Provided written and verbal instructions on pursed lip breathing and utilized returned demonstration as teach back Provided instruction about proper use of medications used for management of COPD including inhalers Advised patient to self assesses COPD action plan zone and make appointment with provider if in the yellow zone for 48 hours without improvement Provided education about and advised patient to utilize infection prevention strategies to reduce risk of respiratory infection Discussed the importance of adequate rest and management of fatigue with COPD Screening for signs and symptoms of depression related to chronic disease state  Assessed social determinant of health barriers Pharm D referral for assistance with [employer] cost constraints  Symptom Management: Take medications as prescribed   Attend all scheduled provider appointments Call provider office for new concerns or questions  call the Suicide and Crisis Lifeline: 988 call the [place] National Suicide Prevention Lifeline: (737)628-1172 or TTY: (302) 611-1568 TTY 306-123-9964) to talk to a trained counselor call 1-800-273-TALK (toll free, 24 hour hotline) if experiencing a Mental Health or [place]  avoid second hand smoke eliminate smoking in my home identify and remove indoor air pollutants limit outdoor activity during cold weather listen for public air quality announcements every day develop a rescue plan eliminate symptom triggers at home follow rescue plan if symptoms flare-up  Follow Up Plan: Telephone follow up appointment with care management team member scheduled for: 04-20-2022 at 1 pm       CCM Expected Outcome:  Monitor, Self-Manage and Reduce  Symptoms of Diabetes       Current Barriers:  Knowledge Deficits related to  the importance of good blood sugar control in a patient with DM Care Coordination needs related to ongoing education and support for management of medications and disease management  in a patient with DM Chronic Disease Management support and education needs related to effective management of DM Lacks caregiver support.   Planned Interventions: Provided education to patient about basic DM disease process; Reviewed medications with patient and discussed importance of medication adherence. The patient is now using pill packs and he says this is working out well for him. Encouraged him to take his medications as prescribed and call for any questions or concerns;        Reviewed prescribed diet with patient heart healthy/ADA diet ; Counseled on importance of regular laboratory monitoring as prescribed;        Discussed plans with patient for ongoing care management follow up and provided patient with direct contact information for care management team;      Provided patient with written educational materials related to hypo and hyperglycemia and importance of correct treatment;       Reviewed scheduled/upcoming provider appointments including: 03-13-2022 at 240 pm;         call provider for findings outside established parameters;       Review of patient status, including review of consultants reports, relevant laboratory and other test results, and medications completed;       Advised patient to discuss changes in his DM and questions or concerns with provider;      Screening for signs and symptoms of depression related to chronic disease state;        Assessed social determinant of health barriers;         Symptom Management: Take medications as prescribed   Attend all scheduled provider appointments Call provider office for new concerns or questions  call the Suicide and Crisis Lifeline: 988 call the [place] National Suicide Prevention Lifeline: 670-825-5432 or TTY: 517-257-2168 TTY  (727)442-6109) to talk to a trained counselor call 1-800-273-TALK (toll free, 24 hour hotline) if experiencing a Mental Health or [place]  check feet daily for cuts, sores or redness trim toenails straight across manage portion size wash and dry feet carefully every day wear comfortable, cotton socks wear comfortable, well-fitting shoes  Follow Up Plan: Telephone follow up appointment with care management team member scheduled for: 04-20-2022 at 1 pm       CCM Expected Outcome:  Monitor, Self-Manage and Reduce Symptoms of Heart Failure       Current Barriers:  Knowledge Deficits related to the importance of monitoring for changes in water weight and effective management of HF Chronic Disease Management support and education needs related to effective management of HF Lacks caregiver support.   Planned Interventions: Basic overview and discussion of pathophysiology of Heart Failure reviewed Provided education on low sodium diet. The patient states he does not add salt to his foods. He uses a product called "no salt". The patient states that he is good about not eating foods high in sodium. Review and education provided Reviewed Heart Failure Action Plan in depth and provided written copy Assessed need for readable accurate scales in home. Discussed safety in weighing Provided education about placing scale on hard, flat surface Advised patient to weigh each morning after emptying bladder Discussed importance of daily weight and advised patient to weigh and record daily Reviewed role of diuretics in prevention  of fluid overload and management of heart failure Discussed the importance of keeping all appointments with provider Provided patient with education about the role of exercise in the management of heart failure Advised patient to discuss changes in his heart failure or heart health with provider Screening for signs and symptoms of depression related to chronic  disease state  Assessed social determinant of health barriers  Symptom Management: Take medications as prescribed   Attend all scheduled provider appointments Call provider office for new concerns or questions  call the Suicide and Crisis Lifeline: 988 call the [place] National Suicide Prevention Lifeline: 717-706-8739 or TTY: 719-759-8360 TTY 812 243 9220) to talk to a trained counselor call 1-800-273-TALK (toll free, 24 hour hotline) if experiencing a Mental Health or [place]  call office if I gain more than 2 pounds in one day or 5 pounds in one week keep legs up while sitting use salt in moderation watch for swelling in feet, ankles and legs every day weigh myself daily develop a rescue plan follow rescue plan if symptoms flare-up track symptoms and what helps feel better or worse dress right for the weather, hot or cold  Follow Up Plan: Telephone follow up appointment with care management team member scheduled for: 04-20-2022 at 1pm       CCM Expected Outcome:  Monitor, Self-Manage and Reduce Symptoms of: Falls and safety concerns       Current Barriers:  Knowledge Deficits related to safety concerns and falls prevention in patient with multiple chronic conditions and history of falls with injury Care Coordination needs related to resources in the community and ongoing support in management of chronic conditions and fall prevention in a patient with history of falls with most recent being 03-02-2022 Chronic Disease Management support and education needs related to effective management of falls prevention and safety concerns in a patient with multiple chronic conditions Lacks caregiver support.   Planned Interventions: Provided  verbal education re: potential causes of falls and Fall prevention strategies Reviewed medications and discussed potential side effects of medications such as dizziness and frequent urination Advised patient of importance of notifying  provider of falls. The patient saw the pcp on 03-02-2022 after having an "accidental fall". The patient states that his elbow is swollen but not as bad as it was yesterday. The patient sees the pcp again on 03-13-2022 Assessed for signs and symptoms of orthostatic hypotension Assessed for falls since last encounter. Last fall occurred on 03-02-2022. Education and fall prevention reviewed.  Assessed patients knowledge of fall risk prevention secondary to previously provided education Provided patient information for fall alert systems Assessed working status of life alert bracelet and patient adherence Advised patient to discuss changes in mobility and new falls with provider Screening for signs and symptoms of depression related to chronic disease state Assessed social determinant of health barriers  Symptom Management: Take medications as prescribed   Attend all scheduled provider appointments Call provider office for new concerns or questions  call the Suicide and Crisis Lifeline: 988 call the [place] National Suicide Prevention Lifeline: 312-309-9510 or TTY: (717)698-2184 TTY 417-587-1271) to talk to a trained counselor call 1-800-273-TALK (toll free, 24 hour hotline) if experiencing a Mental Health or [place]   Follow Up Plan: Telephone follow up appointment with care management team member scheduled for: 04-20-2022 at 1 pm        Plan:Telephone follow up appointment with care management team member scheduled for:  04-20-2022 at 1 pm  Noreene Larsson RN,  MSN, [place]  Chronic Care Management Direct Number: [phone]

## 2022-03-03 NOTE — Plan of Care (Signed)
Chronic Care Management Provider Comprehensive Care Plan    03/03/2022 Name: Justin Griffith MRN: [id number] DOB: 15-Jun-1940  Referral to Chronic Care Management (CCM) services was placed by Provider:  Dr. Park Liter on Date: 02-04-2022.  Chronic Condition 1: COPD Provider Assessment and Plan   Will restart his incruse. Continue to monitor. Call with any concerns.          Relevant Medications    umeclidinium bromide (INCRUSE ELLIPTA) 62.5 MCG/ACT AEPB    Other Relevant Orders    Comprehensive metabolic panel (Completed)    CBC with Differential/Platelet (Completed)    Ambulatory referral to [facility] Referral to Chronic Care Management Services     Expected Outcome/Goals Addressed This Visit (Provider CCM goals/Provider Assessment and plan   Symptom Management Condition 1: Take all medications as prescribed Attend all scheduled provider appointments Call provider office for new concerns or questions  call the Suicide and Crisis Lifeline: 988 call the [place] National Suicide Prevention Lifeline: 201-249-5700 or TTY: 905-175-3035 TTY (929)886-2311) to talk to a trained counselor call 1-800-273-TALK (toll free, 24 hour hotline) if experiencing a Mental Health or [place]  eliminate smoking in my home identify and remove indoor air pollutants limit outdoor activity during cold weather listen for public air quality announcements every day do breathing exercises every day develop a rescue plan eliminate symptom triggers at home follow rescue plan if symptoms flare-up  Chronic Condition 2: DM Provider Assessment and Plan  Checking BMP today. Has been off his insulin because we stopped it. Consider oral.         Relevant Medications    rosuvastatin (CRESTOR) 10 MG tablet    Other Relevant Orders    Basic metabolic panel (Completed)     Expected Outcome/Goals Addressed This Visit (Provider CCM goals/Provider Assessment and plan   CCM  (Diabetes)  EXPECTED OUTCOME:  MONITOR,SELF- MANAGE AND REDUCE SYMPTOMS OF DM    Symptom Management Condition 2: Take all medications as prescribed Attend all scheduled provider appointments Call provider office for new concerns or questions  call the Suicide and Crisis Lifeline: 988 call the [place] National Suicide Prevention Lifeline: 949-330-9636 or TTY: 702-586-2702 TTY 205-858-8453) to talk to a trained counselor call 1-800-273-TALK (toll free, 24 hour hotline) if experiencing a Mental Health or [place]  check feet daily for cuts, sores or redness trim toenails straight across manage portion size wash and dry feet carefully every day wear comfortable, cotton socks wear comfortable, well-fitting shoes  Chronic Condition 3: HF Provider Assessment and Plan  Euvolemic today. Continue to monitor. Restarting medication. Follow up 1 week.          Relevant Medications    apixaban (ELIQUIS) 2.5 MG TABS tablet    cloNIDine (CATAPRES) 0.1 MG tablet    hydrALAZINE (APRESOLINE) 50 MG tablet    rosuvastatin (CRESTOR) 10 MG tablet    Other Relevant Orders    Comprehensive metabolic panel (Completed)    CBC with Differential/Platelet (Completed)    Ambulatory referral to [facility] Referral to Chronic Care Management Services     Expected Outcome/Goals Addressed This Visit (Provider CCM goals/Provider Assessment and plan   CCM (Heart Failure)  EXPECTED OUTCOME:  MONITOR,SELF- MANAGE AND REDUCE SYMPTOMS OF HF    Symptom Management Condition 3: Take all medications as prescribed Attend all scheduled provider appointments Call provider office for new concerns or questions  call the Suicide and Crisis Lifeline: 988  call the [place] National Suicide Prevention Lifeline: (802)211-8648 or TTY: 423-698-6553 TTY (934)678-4857) to talk to a trained counselor call 1-800-273-TALK (toll free, 24 hour hotline) if experiencing a Mental Health or [place]   call office if I gain more than 2 pounds in one day or 5 pounds in one week keep legs up while sitting use salt in moderation watch for swelling in feet, ankles and legs every day develop a rescue plan follow rescue plan if symptoms flare-up track symptoms and what helps feel better or worse dress right for the weather, hot or cold  Chronic Condition 4: Falls prevention and safety Provider Assessment and Plan Patient assessed in the office post fall, education and falls prevention discussed. Monitoring for changes.    Expected Outcome/Goals Addressed This Visit (Provider CCM goals/Provider Assessment and plan   CCM (Falls)  EXPECTED OUTCOME:  MONITOR,SELF- MANAGE AND REDUCE SYMPTOMS OF Falls    Symptom Management Condition 4: Take all medications as prescribed Attend all scheduled provider appointments Call provider office for new concerns or questions  call the Suicide and Crisis Lifeline: 988 call the [place] National Suicide Prevention Lifeline: 540-009-7796 or TTY: (320) 452-3569 TTY (657) 639-5867) to talk to a trained counselor call 1-800-273-TALK (toll free, 24 hour hotline) if experiencing a Mental Health or [place] Crisis   Problem List Patient Active Problem List   Diagnosis Date Noted   Mild dementia without behavioral disturbance, psychotic disturbance, mood disturbance, or anxiety ([place]) 02/24/2022   Benign hypertensive renal disease 02/04/2022   Pressure injury of sacral region, unstageable ([place])    Acute kidney injury superimposed on chronic kidney disease ([place]) 11/18/2021   HLD (hyperlipidemia) 10/31/2021   Type II diabetes mellitus with renal manifestations ([place]) 10/31/2021   CAD (coronary artery disease) 10/31/2021   Chronic diastolic CHF (congestive heart failure) ([place]) 10/31/2021   Right elbow pain 10/15/2021   Gross hematuria 10/12/2021   Chronic anticoagulation 10/12/2021   Stage 3b chronic kidney disease (CKD) ([place]) 10/12/2021   History of  pulmonary embolism 09/18/21 10/06/2021   Generalized weakness 06/25/2021   Lymphedema 03/17/2021   Falls frequently 11/28/2020   Aortic atherosclerosis ([place]) 06/12/2020   History of embolic stroke 47/11/6281   At high risk for falls 05/08/2020   Hyponatremia 03/29/2020   History of non-ST elevation myocardial infarction (NSTEMI) 03/28/2020   Elevated TSH 02/25/2020   History of CVA (cerebrovascular accident) 01/23/2020   Chronic venous insufficiency 05/23/2019   B12 deficiency 04/23/2019   Tremor 11/14/2018   Obesity (BMI 30-39.9) 09/07/2017   Chronic obstructive pulmonary disease (COPD) ([place]) 04/29/2017   Intertrigo 02/02/2017   BPH with obstruction/lower urinary tract symptoms Foley dependent 07/23/2016   Advanced care planning/counseling discussion 66/29/4765   Eosinophilic esophagitis 46/50/3546   PAD (peripheral artery disease) ([place]) 01/08/2016   GERD without esophagitis 01/06/2016   Hyperlipidemia associated with type 2 diabetes mellitus ([place]) 01/16/2015   Sleep apnea 01/16/2015    Medication Management  Current Outpatient Medications:    apixaban (ELIQUIS) 2.5 MG TABS tablet, Take 1 tablet (2.5 mg total) by mouth 2 (two) times daily. For blood thinner, Disp: 60 tablet, Rfl: 1   ascorbic acid (VITAMIN C) 500 MG tablet, Take 1 tablet (500 mg total) by mouth daily. To keep your immune system up, Disp: 30 tablet, Rfl: 0   blood glucose meter kit and supplies, 1 each by Other route daily. Dispense based on patient and insurance preference. Use up to four times daily as directed. (FOR ICD-10 E10.9, E11.9)., Disp:  1 each, Rfl: 0   cloNIDine (CATAPRES) 0.1 MG tablet, Take 1 tablet (0.1 mg total) by mouth 2 (two) times daily. For blood pressure, Disp: 60 tablet, Rfl: 1   gabapentin (NEURONTIN) 100 MG capsule, Take 1 capsule (100 mg total) by mouth 3 (three) times daily. For nerve pain and itching, Disp: 90 capsule, Rfl: 1   hydrALAZINE (APRESOLINE) 50 MG tablet, 1 tab 3 times a day for  blood pressure, Disp: 90 tablet, Rfl: 1   losartan (COZAAR) 25 MG tablet, Take 1 tablet (25 mg total) by mouth daily., Disp: 30 tablet, Rfl: 1   Multiple Vitamin (MULTIVITAMIN WITH MINERALS) TABS tablet, Take 1 tablet by mouth daily. For nutrition, Disp: 30 tablet, Rfl: 2   nystatin (MYCOSTATIN/NYSTOP) powder, Apply 1 Application topically 3 (three) times daily., Disp: 15 g, Rfl: 0   omeprazole (PRILOSEC) 40 MG capsule, Take 1 capsule (40 mg total) by mouth daily. For heartburn, Disp: 30 capsule, Rfl: 1   polyethylene glycol powder (GLYCOLAX/MIRALAX) 17 GM/SCOOP powder, Take 17 g by mouth 2 (two) times daily as needed., Disp: 3350 g, Rfl: 1   rosuvastatin (CRESTOR) 10 MG tablet, Take 1 tablet (10 mg total) by mouth at bedtime. For cholesterol, Disp: 30 tablet, Rfl: 1   sodium hypochlorite (DAKIN'S 1/2 STRENGTH) external solution, Apply 1 Application topically daily at 12 noon. Topically to wound daily to clean., Disp: , Rfl:    Torsemide 40 MG TABS, Take by mouth., Disp: , Rfl:    umeclidinium bromide (INCRUSE ELLIPTA) 62.5 MCG/ACT AEPB, Inhale 1 puff into the lungs daily. For your breathing, Disp: 30 each, Rfl: 3   Zinc Oxide 10 % OINT, Apply topically., Disp: , Rfl:   Cognitive Assessment Identity Confirmed: : Name; DOB Cognitive Status: Normal Other:  : N/A   Functional Assessment Hearing Difficulty or Deaf: yes Hearing Management: wears hearing aides Wear Glasses or Blind: yes Vision Management: wears reading glasses Concentrating, Remembering or Making Decisions Difficulty (CP): no Difficulty Communicating: no Difficulty Eating/Swallowing: no Walking or Climbing Stairs Difficulty: yes Walking or Climbing Stairs: ambulation difficulty, requires equipment Mobility Management: uses walker when ambulating- frequent falls Dressing/Bathing Difficulty: no Doing Errands Independently Difficulty (such as shopping) (CP): yes Errands Management: does not drive any longer, his wife drives him  to his appointments and helps with needs Change in Functional Status Since Onset of Current Illness/Injury: no   Caregiver Assessment  Primary Source of Support/Comfort: spouse Name of Support/Comfort Primary Source: [place] in Home: spouse Name(s) of People in Home: wife [place] Family Caregiver if Needed: spouse Family Caregiver Names: wife- Tandy Gaw- has a son out of state Primary Roles/Responsibilities: retired   Planned Interventions  Provided patient with basic written and verbal COPD education on self care/management/and exacerbation prevention Advised patient to track and manage COPD triggers Provided written and verbal instructions on pursed lip breathing and utilized returned demonstration as teach back Provided instruction about proper use of medications used for management of COPD including inhalers Advised patient to self assesses COPD action plan zone and make appointment with provider if in the yellow zone for 48 hours without improvement Provided education about and advised patient to utilize infection prevention strategies to reduce risk of respiratory infection Discussed the importance of adequate rest and management of fatigue with COPD Screening for signs and symptoms of depression related to chronic disease state  Assessed social determinant of health barriers Pharm D referral for assistance with Judithann Sauger cost constraints Provided education to patient about basic DM  disease process; Reviewed medications with patient and discussed importance of medication adherence. The patient is now using pill packs and he says this is working out well for him. Encouraged him to take his medications as prescribed and call for any questions or concerns;        Reviewed prescribed diet with patient heart healthy/ADA diet ; Counseled on importance of regular laboratory monitoring as prescribed;        Discussed plans with patient for ongoing care management follow up and  provided patient with direct contact information for care management team;      Provided patient with written educational materials related to hypo and hyperglycemia and importance of correct treatment;       Reviewed scheduled/upcoming provider appointments including: 03-13-2022 at 240 pm;         call provider for findings outside established parameters;       Review of patient status, including review of consultants reports, relevant laboratory and other test results, and medications completed;       Advised patient to discuss changes in his DM and questions or concerns with provider;      Screening for signs and symptoms of depression related to chronic disease state;        AssessBasic overview and discussion of pathophysiology of Heart Failure reviewed Provided education on low sodium diet. The patient states he does not add salt to his foods. He uses a product called "no salt". The patient states that he is good about not eating foods high in sodium. Review and education provided Reviewed Heart Failure Action Plan in depth and provided written copy Assessed need for readable accurate scales in home. Discussed safety in weighing Provided education about placing scale on hard, flat surface Advised patient to weigh each morning after emptying bladder Discussed importance of daily weight and advised patient to weigh and record daily Reviewed role of diuretics in prevention of fluid overload and management of heart failure Discussed the importance of keeping all appointments with provider Provided patient with education about the role of exercise in the management of heart failure Advised patient to discuss changes in his heart failure or heart health with provider Screening for signs and symptoms of depression related to chronic disease state  Assessed social determinant of health barriers     Provided  verbal education re: potential causes of falls and Fall prevention strategies Reviewed  medications and discussed potential side effects of medications such as dizziness and frequent urination Advised patient of importance of notifying provider of falls. The patient saw the pcp on 03-02-2022 after having an "accidental fall". The patient states that his elbow is swollen but not as bad as it was yesterday. The patient sees the pcp again on 03-13-2022 Assessed for signs and symptoms of orthostatic hypotension Assessed for falls since last encounter. Last fall occurred on 03-02-2022. Education and fall prevention reviewed.  Assessed patients knowledge of fall risk prevention secondary to previously provided education Provided patient information for fall alert systems Assessed working status of life alert bracelet and patient adherence Advised patient to discuss changes in mobility and new falls with provider Screening for signs and symptoms of depression related to chronic disease state Assessed social determinant of health barriers   Interaction and coordination with outside resources, practitioners, and providers See CCM Referral  Care Plan: Patient declined

## 2022-03-03 NOTE — Patient Instructions (Signed)
Please call the care guide team at [phone] if you need to cancel or reschedule your appointment.   If you are experiencing a Mental Health or [place] or need someone to talk to, please call the Suicide and Crisis Lifeline: 988 call the [place] National Suicide Prevention Lifeline: 6122285544 or TTY: 720-728-0385 TTY [phone]) to talk to a trained counselor call 1-800-273-TALK (toll free, 24 hour hotline)   Following is a copy of your full provider care plan:   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   Planned Interventions: Provided patient with basic written and verbal COPD education on self care/management/and exacerbation prevention Advised patient to track and manage COPD triggers Provided written and verbal instructions on pursed lip breathing and utilized returned demonstration as teach back Provided instruction about proper use of medications used for management of COPD including inhalers Advised patient to self assesses COPD action plan zone and make appointment with provider if in the yellow zone for 48 hours without improvement Provided education about and advised patient to utilize infection prevention strategies to reduce risk of respiratory infection Discussed the importance of adequate rest and management of fatigue with COPD Screening for signs and symptoms of depression related to chronic disease state   Assessed social determinant of health barriers Pharm D referral for assistance with [employer] cost constraints  Symptom Management: Take medications as prescribed   Attend all scheduled provider appointments Call provider office for new concerns or questions  call the Suicide and Crisis Lifeline: 988 call the [place] National Suicide Prevention Lifeline: 701-240-8744 or TTY: 515-227-6274 TTY (786) 059-1500) to talk to a trained counselor call 1-800-273-TALK (toll free, 24 hour hotline) if experiencing a Mental Health or [place]  avoid second hand smoke eliminate smoking in my home identify and remove indoor air pollutants limit outdoor activity during cold weather listen for public air quality announcements every day develop a rescue plan eliminate symptom triggers at home follow rescue plan if symptoms flare-up  Follow Up Plan: Telephone follow up appointment with care management team member scheduled for: 04-20-2022 at 1 pm       CCM Expected Outcome:  Monitor, Self-Manage and Reduce Symptoms of Diabetes       Current Barriers:  Knowledge Deficits related to the importance of good blood sugar control in a patient with DM Care Coordination needs related to ongoing education and support for management of medications and disease management  in a patient with DM Chronic Disease Management support and education needs related to effective management of DM Lacks caregiver support.   Planned Interventions: Provided education to patient about basic DM disease process; Reviewed medications with patient and discussed importance of medication adherence. The patient is now using pill packs and he says this is working out well for him. Encouraged him to take his medications as prescribed and call for any questions or concerns;  Reviewed prescribed diet with patient heart healthy/ADA diet ; Counseled on importance of regular laboratory monitoring as prescribed;         Discussed plans with patient for ongoing care management follow up and provided patient with direct contact information for care management team;      Provided patient with written educational materials related to hypo and hyperglycemia and importance of correct treatment;       Reviewed scheduled/upcoming provider appointments including: 03-13-2022 at 240 pm;         call provider for findings outside established parameters;       Review of patient status, including review of consultants reports, relevant laboratory and other test results, and medications completed;       Advised patient to discuss changes in his DM and questions or concerns with provider;      Screening for signs and symptoms of depression related to chronic disease state;        Assessed social determinant of health barriers;         Symptom Management: Take medications as prescribed   Attend all scheduled provider appointments Call provider office for new concerns or questions  call the Suicide and Crisis Lifeline: 988 call the [place] National Suicide Prevention Lifeline: 806-408-7232 or TTY: 215-377-5204 TTY 563-699-1113) to talk to a trained counselor call 1-800-273-TALK (toll free, 24 hour hotline) if experiencing a Mental Health or [place]  check feet daily for cuts, sores or redness trim toenails straight across manage portion size wash and dry feet carefully every day wear comfortable, cotton socks wear comfortable, well-fitting shoes  Follow Up Plan: Telephone follow up appointment with care management team member scheduled for: 04-20-2022 at 1 pm       CCM Expected Outcome:  Monitor, Self-Manage and Reduce Symptoms of Heart Failure       Current Barriers:  Knowledge Deficits related to the importance of monitoring for changes in water weight and effective management of HF Chronic Disease Management support and education needs related to effective management of HF Lacks caregiver support.    Planned Interventions: Basic overview and discussion of pathophysiology of Heart Failure reviewed Provided education on low sodium diet. The patient states he does not add salt to his foods. He uses a product called "no salt". The patient states that he is good about not eating foods high in sodium. Review and education provided Reviewed Heart Failure Action Plan in depth and provided written copy Assessed need for readable accurate scales in home. Discussed safety in weighing Provided education about placing scale on hard, flat surface Advised patient to weigh each morning after emptying bladder Discussed importance of daily weight and advised patient to weigh and record daily Reviewed role of diuretics in prevention of fluid overload and management of heart failure Discussed the importance of keeping all appointments with provider Provided patient with education about the role of exercise in the management of heart failure Advised patient to discuss changes in his heart failure or heart health with provider Screening for signs and symptoms of depression related to chronic disease state  Assessed social determinant of health barriers  Symptom Management: Take medications as prescribed   Attend all scheduled provider appointments Call provider office for new concerns or questions  call the Suicide and Crisis Lifeline: 988 call the [place] National Suicide Prevention Lifeline: (201)006-0968 or TTY: (959)642-3072 TTY 339-826-2990) to talk to a trained counselor call 1-800-273-TALK (toll free, 24 hour hotline) if experiencing a Mental Health or Behavioral  Health Crisis  call office if I gain more than 2 pounds in one day or 5 pounds in one week keep legs up while sitting use salt in moderation watch for swelling in feet, ankles and legs every day weigh myself daily develop a rescue plan follow rescue plan if symptoms flare-up track symptoms and what helps feel better or worse dress right  for the weather, hot or cold  Follow Up Plan: Telephone follow up appointment with care management team member scheduled for: 04-20-2022 at 1pm       CCM Expected Outcome:  Monitor, Self-Manage and Reduce Symptoms of: Falls and safety concerns       Current Barriers:  Knowledge Deficits related to safety concerns and falls prevention in patient with multiple chronic conditions and history of falls with injury Care Coordination needs related to resources in the community and ongoing support in management of chronic conditions and fall prevention in a patient with history of falls with most recent being 03-02-2022 Chronic Disease Management support and education needs related to effective management of falls prevention and safety concerns in a patient with multiple chronic conditions Lacks caregiver support.   Planned Interventions: Provided  verbal education re: potential causes of falls and Fall prevention strategies Reviewed medications and discussed potential side effects of medications such as dizziness and frequent urination Advised patient of importance of notifying provider of falls. The patient saw the pcp on 03-02-2022 after having an "accidental fall". The patient states that his elbow is swollen but not as bad as it was yesterday. The patient sees the pcp again on 03-13-2022 Assessed for signs and symptoms of orthostatic hypotension Assessed for falls since last encounter. Last fall occurred on 03-02-2022. Education and fall prevention reviewed.  Assessed patients knowledge of fall risk prevention secondary to previously provided education Provided patient information for fall alert systems Assessed working status of life alert bracelet and patient adherence Advised patient to discuss changes in mobility and new falls with provider Screening for signs and symptoms of depression related to chronic disease state Assessed social determinant of health barriers  Symptom Management: Take  medications as prescribed   Attend all scheduled provider appointments Call provider office for new concerns or questions  call the Suicide and Crisis Lifeline: 988 call the [place] National Suicide Prevention Lifeline: (682)046-0028 or TTY: 586-887-6470 TTY (847)408-1674) to talk to a trained counselor call 1-800-273-TALK (toll free, 24 hour hotline) if experiencing a Mental Health or [place]   Follow Up Plan: Telephone follow up appointment with care management team member scheduled for: 04-20-2022 at 1 pm       COMPLETED: RNCM: Improve My Heart Health-Coronary Artery Disease       Timeframe:  Long-Range Goal Priority:  High Start Date:                             Expected End Date:     07-20-2021                Follow Up Date 10-01-2020   - be open to making changes - I can manage, know and watch for signs of a heart attack - if I have chest pain, call for help - learn about small changes that will make a big difference - learn my personal risk factors    Why is this important?   Lifestyle changes are key to improving the blood flow to your heart. Think about the  things you can change and set a goal to live healthy.  Remember, when the blood vessels to your heart start to get clogged you may not have any symptoms.  Over time, they can get worse.  Don't ignore the signs, like chest pain, and get help right away.     Notes: recent hospitalization for NSTEMI. 07-30-2020: The patient recently had to reschedule his cardiology visit because his daughter could not take him to his appointment. The appointment has been rescheduled for June. Follows up again in June with pcp. Still having swelling in feet and legs. States today it is worse than usual.      COMPLETED: RNCM: Monitor and Manage My Blood Sugar-Diabetes Type 2       Timeframe:  Long-Range Goal Priority:  High Start Date:                             Expected End Date:      07-20-2021              Follow Up Date  10-01-2020   - check blood sugar at prescribed times - check blood sugar before and after exercise - check blood sugar if I feel it is too high or too low - enter blood sugar readings and medication or insulin into daily log - take the blood sugar log to all doctor visits    Why is this important?   Checking your blood sugar at home helps to keep it from getting very high or very low.  Writing the results in a diary or log helps the doctor know how to care for you.  Your blood sugar log should have the time, date and the results.  Also, write down the amount of insulin or other medicine that you take.  Other information, like what you ate, exercise done and how you were feeling, will also be helpful.     Notes: Patient not consistently checking blood sugars. 07-30-2020: States his blood sugars are 140 to 160 currently.         The patient verbalized understanding of instructions, educational materials, and care plan provided today and DECLINED offer to receive copy of patient instructions, educational materials, and care plan.   Telephone follow up appointment with care management team member scheduled for: 04-20-2022 at 1 pm

## 2022-03-04 ENCOUNTER — Inpatient Hospital Stay: Payer: HMO

## 2022-03-04 ENCOUNTER — Inpatient Hospital Stay: Payer: HMO | Admitting: Oncology

## 2022-03-05 ENCOUNTER — Ambulatory Visit: Payer: HMO | Admitting: Family Medicine

## 2022-03-05 ENCOUNTER — Telehealth: Payer: Self-pay | Admitting: Family Medicine

## 2022-03-05 NOTE — Telephone Encounter (Signed)
I am aware  

## 2022-03-05 NOTE — Telephone Encounter (Signed)
Copied from [place] [id number]. Topic: General - Other >> Mar 04, 2022  3:19 PM Leilani Able wrote: Reason for CRM: [facility] [phone] has called reporting that pt fell 12/11 going to PCP office, Saw dr afterwards, refused x-Ray and dr is aware but as a matter of practice needed to document.  No injuries, tripped over oxygen tubing.

## 2022-03-09 ENCOUNTER — Telehealth: Payer: Self-pay | Admitting: Family Medicine

## 2022-03-09 ENCOUNTER — Encounter: Payer: Self-pay | Admitting: Urology

## 2022-03-09 ENCOUNTER — Ambulatory Visit: Payer: Self-pay

## 2022-03-09 ENCOUNTER — Ambulatory Visit (INDEPENDENT_AMBULATORY_CARE_PROVIDER_SITE_OTHER): Payer: PPO | Admitting: Urology

## 2022-03-09 VITALS — BP 194/93 | HR 70

## 2022-03-09 DIAGNOSIS — R310 Gross hematuria: Secondary | ICD-10-CM | POA: Diagnosis not present

## 2022-03-09 DIAGNOSIS — N401 Enlarged prostate with lower urinary tract symptoms: Secondary | ICD-10-CM

## 2022-03-09 DIAGNOSIS — N138 Other obstructive and reflux uropathy: Secondary | ICD-10-CM

## 2022-03-09 DIAGNOSIS — R339 Retention of urine, unspecified: Secondary | ICD-10-CM | POA: Diagnosis not present

## 2022-03-09 NOTE — Telephone Encounter (Signed)
  Chief Complaint: High BP readings. Symptoms: none Frequency: ongoing Pertinent Negatives: Patient denies chest pain, sob, neuro Disposition: '[]'$ ED /'[]'$ Urgent Care (no appt availability in office) / '[x]'$ Appointment(In office/virtual)/ '[]'$  [facility]/ '[]'$ Home Care/ '[]'$ Refused Recommended Disposition /'[]'$ [place] [facility]/ '[]'$  Follow-up with PCP Additional Notes: Call from [facility]. She took pt's bp this morning upon arrival. At that time it was 200/98.  PT took HTN meds. 20 minutes later BP was 170/98. Raquel Sarna reports that legs are still swollen as is left hand. Pt reports no HA, chest pain or neuro issues.  PT has an appt scheduled for Friday. Pt has medical appts. Every day this week.   Please advise if you want to see pt sooner.

## 2022-03-09 NOTE — Progress Notes (Signed)
   03/09/2022 3:11 PM   Justin Griffith 06-Apr-1940 [id number]  Reason for visit: Follow up BPH, urinary retention, history of gross hematuria and recurrent UTI  HPI: Extremely comorbid 81 year old male here for follow-up of BPH with recurrent gross hematuria, recurrent UTIs, and urinary retention currently managed with chronic Foley catheter.  Prostate measures ~200 g on CT.  He had originally opted for HOLEP to address his urinary retention and recurrent gross hematuria, however had a pulmonary embolus 1 week prior to scheduled surgery in June 2023 that required anticoagulation with Eliquis.  He was last seen by urology by Debroah Loop, PA in July 2023 and he was supposed to be scheduled with interventional radiology for prostatic artery embolization to address both his Foley dependent urinary retention and recurrent gross hematuria.  He apparently never followed up for that visit, and would not answer the phone for virtual visits with interventional radiology to discuss prostatic artery embolization.  On chart review, he has had numerous ER visits over the last 4 months with gross hematuria or non-draining Foley(often pulled down into the prostatic fossa on imaging).  His Foley was most recently exchanged in the ER on 02/02/2022(42F 3-way), and he reports he has been doing very well with this catheter in place and urine has remained essentially clear.   At our last visit on 02/25/2022 we had discussed options for his BPH and urinary retention with recurrent gross hematuria at length including prostatic artery embolization(PAE) with interventional radiology vs. HOLEP.  We also discussed the challenges of treatment options with his anticoagulation for pulmonary embolus in June 2023.  He ultimately deferred HOLEP with the potential risk for incontinence, in fact the procedure would have to be delayed at least a few months until he is off anticoagulation.  He had a virtual visit with  interventional radiology to discuss PAE on 02/27/2022.  He is very frustrated about having to go to [facility] for that procedure, but is interested in pursuing PAE, but would like to wait until after the holidays until scheduling.  He has another virtual visit to discuss timing of that procedure with IR on 03/25/2021.  I again recommended catheter change today, as his Foley was last changed on 02/02/2022.  He adamantly refused catheter change today, as he has had problems previously with ER visits from hematuria after catheter exchange, or the catheter being pulled out of the prostate.  We discussed the risks at length including sediment causing impaired drainage or infection, and he understands these risks.  -Keep scheduled follow-up with IR on 03/25/2021 to likely schedule PAE and review procedure again -Follow-up with me 4 weeks after PAE for voiding trial.  He also would like to see me about a week after his PAE procedure which is fine  I spent 25 total minutes on the day of the encounter including pre-visit review of the medical record, face-to-face time with the patient, and post visit ordering of labs/imaging/tests.   Billey Co, [facility] [address] [employer] [employer] [place], [place] [address] [phone]

## 2022-03-09 NOTE — Telephone Encounter (Signed)
Miscommunication. Raquel Sarna says she was not calling for verbal orders, but wanted to make the provider aware of patient's elevated BP readings. Patient is scheduled with provider Friday 03/13/22.

## 2022-03-09 NOTE — Telephone Encounter (Signed)
Home Health Verbal Orders - Caller/Agency: Dorita Fray  Callback Number: [phone] Requesting OT/PT/Skilled Nursing/Social Work/Speech Therapy:  Frequency:   BP readings:   200/98 170/98  No headache  Transferred to Triage

## 2022-03-09 NOTE — Telephone Encounter (Signed)
OK for verbal orders?

## 2022-03-09 NOTE — Telephone Encounter (Signed)
Reason for Disposition . Systolic BP  >= 831 OR Diastolic >= 517  Answer Assessment - Initial Assessment Questions 1. BLOOD PRESSURE: "What is the blood pressure?" "Did you take at least two measurements 5 minutes apart?"     Before meds 200/98. 20 minutes after meds 170/98 2. ONSET: "When did you take your blood pressure?"     20 minutes ago 3. HOW: "How did you take your blood pressure?" (e.g., automatic home BP monitor, visiting nurse)     Visiting PT 4. HISTORY: "Do you have a history of high blood pressure?"     yes 5. MEDICINES: "Are you taking any medicines for blood pressure?" "Have you missed any doses recently?"     yes 6. OTHER SYMPTOMS: "Do you have any symptoms?" (e.g., blurred vision, chest pain, difficulty breathing, headache, weakness)     No 7. PREGNANCY: "Is there any chance you are pregnant?" "When was your last menstrual period?"  Protocols used: Blood Pressure - High-A-AH

## 2022-03-10 ENCOUNTER — Encounter: Payer: Self-pay | Admitting: Oncology

## 2022-03-10 ENCOUNTER — Inpatient Hospital Stay: Payer: PPO

## 2022-03-10 ENCOUNTER — Inpatient Hospital Stay: Payer: PPO | Attending: Oncology | Admitting: Oncology

## 2022-03-10 VITALS — BP 201/93 | HR 64 | Temp 97.8°F | Resp 18 | Wt 244.1 lb

## 2022-03-10 DIAGNOSIS — Z87891 Personal history of nicotine dependence: Secondary | ICD-10-CM | POA: Diagnosis not present

## 2022-03-10 DIAGNOSIS — D500 Iron deficiency anemia secondary to blood loss (chronic): Secondary | ICD-10-CM

## 2022-03-10 DIAGNOSIS — G4733 Obstructive sleep apnea (adult) (pediatric): Secondary | ICD-10-CM | POA: Diagnosis not present

## 2022-03-10 DIAGNOSIS — E119 Type 2 diabetes mellitus without complications: Secondary | ICD-10-CM | POA: Diagnosis not present

## 2022-03-10 DIAGNOSIS — R5383 Other fatigue: Secondary | ICD-10-CM | POA: Diagnosis not present

## 2022-03-10 DIAGNOSIS — Z8719 Personal history of other diseases of the digestive system: Secondary | ICD-10-CM | POA: Diagnosis not present

## 2022-03-10 DIAGNOSIS — M549 Dorsalgia, unspecified: Secondary | ICD-10-CM | POA: Insufficient documentation

## 2022-03-10 DIAGNOSIS — Z7901 Long term (current) use of anticoagulants: Secondary | ICD-10-CM | POA: Diagnosis not present

## 2022-03-10 DIAGNOSIS — Z800 Family history of malignant neoplasm of digestive organs: Secondary | ICD-10-CM | POA: Insufficient documentation

## 2022-03-10 DIAGNOSIS — J439 Emphysema, unspecified: Secondary | ICD-10-CM | POA: Insufficient documentation

## 2022-03-10 DIAGNOSIS — M7989 Other specified soft tissue disorders: Secondary | ICD-10-CM | POA: Diagnosis not present

## 2022-03-10 DIAGNOSIS — D509 Iron deficiency anemia, unspecified: Secondary | ICD-10-CM | POA: Insufficient documentation

## 2022-03-10 DIAGNOSIS — Z888 Allergy status to other drugs, medicaments and biological substances status: Secondary | ICD-10-CM | POA: Diagnosis not present

## 2022-03-10 DIAGNOSIS — I2510 Atherosclerotic heart disease of native coronary artery without angina pectoris: Secondary | ICD-10-CM | POA: Insufficient documentation

## 2022-03-10 DIAGNOSIS — R509 Fever, unspecified: Secondary | ICD-10-CM | POA: Insufficient documentation

## 2022-03-10 DIAGNOSIS — Z86010 Personal history of colonic polyps: Secondary | ICD-10-CM | POA: Insufficient documentation

## 2022-03-10 DIAGNOSIS — J4489 Other specified chronic obstructive pulmonary disease: Secondary | ICD-10-CM | POA: Diagnosis not present

## 2022-03-10 DIAGNOSIS — Z86711 Personal history of pulmonary embolism: Secondary | ICD-10-CM

## 2022-03-10 DIAGNOSIS — D649 Anemia, unspecified: Secondary | ICD-10-CM

## 2022-03-10 DIAGNOSIS — I110 Hypertensive heart disease with heart failure: Secondary | ICD-10-CM | POA: Insufficient documentation

## 2022-03-10 DIAGNOSIS — I5032 Chronic diastolic (congestive) heart failure: Secondary | ICD-10-CM | POA: Insufficient documentation

## 2022-03-10 DIAGNOSIS — Z9049 Acquired absence of other specified parts of digestive tract: Secondary | ICD-10-CM | POA: Insufficient documentation

## 2022-03-10 DIAGNOSIS — Z86718 Personal history of other venous thrombosis and embolism: Secondary | ICD-10-CM | POA: Diagnosis not present

## 2022-03-10 DIAGNOSIS — N400 Enlarged prostate without lower urinary tract symptoms: Secondary | ICD-10-CM | POA: Insufficient documentation

## 2022-03-10 DIAGNOSIS — R410 Disorientation, unspecified: Secondary | ICD-10-CM | POA: Insufficient documentation

## 2022-03-10 DIAGNOSIS — Z818 Family history of other mental and behavioral disorders: Secondary | ICD-10-CM | POA: Insufficient documentation

## 2022-03-10 DIAGNOSIS — Z79899 Other long term (current) drug therapy: Secondary | ICD-10-CM | POA: Diagnosis not present

## 2022-03-10 DIAGNOSIS — Z833 Family history of diabetes mellitus: Secondary | ICD-10-CM | POA: Insufficient documentation

## 2022-03-10 DIAGNOSIS — E785 Hyperlipidemia, unspecified: Secondary | ICD-10-CM | POA: Insufficient documentation

## 2022-03-10 LAB — CBC WITH DIFFERENTIAL/PLATELET
Abs Immature Granulocytes: 0.02 10*3/uL (ref 0.00–0.07)
Basophils Absolute: 0.1 10*3/uL (ref 0.0–0.1)
Basophils Relative: 1 %
Eosinophils Absolute: 0.3 10*3/uL (ref 0.0–0.5)
Eosinophils Relative: 5 %
HCT: 35.6 % — ABNORMAL LOW (ref 39.0–52.0)
Hemoglobin: 11.6 g/dL — ABNORMAL LOW (ref 13.0–17.0)
Immature Granulocytes: 0 %
Lymphocytes Relative: 25 %
Lymphs Abs: 1.5 10*3/uL (ref 0.7–4.0)
MCH: 28.6 pg (ref 26.0–34.0)
MCHC: 32.6 g/dL (ref 30.0–36.0)
MCV: 87.7 fL (ref 80.0–100.0)
Monocytes Absolute: 0.5 10*3/uL (ref 0.1–1.0)
Monocytes Relative: 8 %
Neutro Abs: 3.8 10*3/uL (ref 1.7–7.7)
Neutrophils Relative %: 61 %
Platelets: 312 10*3/uL (ref 150–400)
RBC: 4.06 MIL/uL — ABNORMAL LOW (ref 4.22–5.81)
RDW: 13.6 % (ref 11.5–15.5)
WBC: 6.2 10*3/uL (ref 4.0–10.5)
nRBC: 0 % (ref 0.0–0.2)

## 2022-03-10 LAB — FERRITIN: Ferritin: 23 ng/mL — ABNORMAL LOW (ref 24–336)

## 2022-03-10 LAB — IRON AND TIBC
Iron: 46 ug/dL (ref 45–182)
Saturation Ratios: 13 % — ABNORMAL LOW (ref 17.9–39.5)
TIBC: 363 ug/dL (ref 250–450)
UIBC: 317 ug/dL

## 2022-03-10 LAB — FOLATE: Folate: 17.1 ng/mL (ref >5.9)

## 2022-03-10 LAB — TECHNOLOGIST SMEAR REVIEW
Plt Morphology: NORMAL
RBC MORPHOLOGY: NORMAL
WBC MORPHOLOGY: NORMAL

## 2022-03-10 NOTE — Progress Notes (Unsigned)
Pt here to establish care for history of PE

## 2022-03-11 ENCOUNTER — Other Ambulatory Visit: Payer: HMO | Admitting: Pharmacist

## 2022-03-11 ENCOUNTER — Ambulatory Visit: Payer: Self-pay | Admitting: *Deleted

## 2022-03-11 DIAGNOSIS — D509 Iron deficiency anemia, unspecified: Secondary | ICD-10-CM | POA: Insufficient documentation

## 2022-03-11 LAB — ANTIPHOSPHOLIPID SYNDROME PROF
Anticardiolipin IgG: 14 GPL U/mL (ref 0–14)
Anticardiolipin IgM: <9 MPL U/mL (ref 0–12)
DRVVT: 31.5 s (ref 0.0–47.0)
PTT Lupus Anticoagulant: 29.6 s (ref 0.0–43.5)

## 2022-03-11 LAB — PROTEIN S, TOTAL AND FREE
Protein S Ag, Free: 94 % (ref 61–136)
Protein S Ag, Total: 119 % (ref 60–150)

## 2022-03-11 LAB — PROTEIN C ACTIVITY: Protein C Activity: 100 % (ref 73–180)

## 2022-03-11 NOTE — Assessment & Plan Note (Signed)
Iron panel is consistent with iron deficiency anemia

## 2022-03-11 NOTE — Assessment & Plan Note (Addendum)
No obvious provoking events prior to diagnosis of pulmonary embolism.  He has multiple risk factors of recurrent thrombosis, history of unprovoked PE, history embolic stroke, sedentary lifestyle Check hypercoagulable work up - factor V, prothrombin gene, APS panel, protein C and S.  If negative, I recommend patient to decrease to Elqiuis 2.'5mg'$  BID for prophylaxis.

## 2022-03-11 NOTE — Chronic Care Management (AMB) (Signed)
   03/11/2022 Name: Justin Griffith MRN: [id number] DOB: 1940-08-24  Chief Complaint  Patient presents with   Medication Assistance    MANLY NESTLE is a 81 y.o. year old male who presented for a telephone visit.   They were referred to the pharmacist by their Case Management Team  for assistance in managing medication access.   From review of chart, note RN from [facility] contacted office today regarding elevated blood pressure readings of 220/95 and 220/88) for patient from today. Per note he was advised to go to ED, but declined.  Reach patient by telephone today. He acknowledges advice given by triage nurse and home health nurse to go to ED today, but refuses to go. States he will instead wait to see PCP on Friday.   Patient reports he has taken his medication today from his pill pack. Ask patient to review medications from pill pack with me today to determine what medications he has taken, but he refuses. Ask patient to bring his pill pack with him to his appointment with PCP.  Regarding patient's medication assistance needs, patient unsure if he would like my assistance with applying for patient assistance program today. States that he would prefer if I call him back in January.   Follow Up Plan:  1) Patient advised to go to ED for further evaluation given reported blood pressure readings from [place], but patient refuses 2) Will collaborate with PCP regarding discussion today with patient 3) Reschedule time to discuss medications assistance options for patient's inhaler as requested. Will outreach to patient by telephone again on 04/01/2022 at 1:30 pm  Wallace Cullens, PharmD, [place] [phone]

## 2022-03-11 NOTE — Telephone Encounter (Signed)
  Chief Complaint: elevated BP per Shirlean Mylar from [facility] RN.  Symptoms: BP 220/95 and rechecked for 220/88. Took medication as prescribed . Asymptomatic. Reports swelling in legs and arms.  Frequency: today  Pertinent Negatives: Patient denies chest pain , no difficulty breathing no blurred vision no slurred speech. No weakness N/T on either side of body.  Disposition: '[x]'$ ED /'[]'$ Urgent Care (no appt availability in office) / '[]'$ Appointment(In office/virtual)/ '[]'$  [facility]/ '[]'$ Home Care/ '[x]'$ Refused Recommended Disposition /'[]'$  Mobile Bus/ '[]'$  Follow-up with PCP Additional Notes:   Recommended to be seen within 4 hours and go to ED. Patient reports he does not want to go to ED . Please advise if medication can be prescribed. Patient would like a call back. #[phone].    Reason for Disposition  [9] Systolic BP  >= 758 OR Diastolic >= 832 AND [5] having NO cardiac or neurologic symptoms  Answer Assessment - Initial Assessment Questions 1. BLOOD PRESSURE: "What is the blood pressure?" "Did you take at least two measurements 5 minutes apart?"     BP 220/95 prior to call 2. ONSET: "When did you take your blood pressure?"     Prior to call and now  3. HOW: "How did you take your blood pressure?" (e.g., automatic home BP monitor, visiting nurse)     Visiting nurse  4. HISTORY: "Do you have a history of high blood pressure?"     Yes  5. MEDICINES: "Are you taking any medicines for blood pressure?" "Have you missed any doses recently?"     Yes has been taking as prescribed no missed doses 6. OTHER SYMPTOMS: "Do you have any symptoms?" (e.g., blurred vision, chest pain, difficulty breathing, headache, weakness)     Asymptomatic . Legs and arms swelling right leg more swollen than left leg and left arm more swollen than the right arm  7. PREGNANCY: "Is there any chance you are pregnant?" "When was your last menstrual period?"     na  Protocols used: Blood Pressure -  High-A-AH

## 2022-03-11 NOTE — Progress Notes (Signed)
Hematology/Oncology Consult note [phone] [phone]         Patient Care Team: Valerie Roys, DO as PCP - General (Family Medicine) Guadalupe Maple, MD (Family Medicine) Bary Castilla, Forest Gleason, MD (General Surgery) Carloyn Manner, MD as Referring Physician (Otolaryngology) Delana Meyer, Dolores Lory, MD (Vascular Surgery) Abisogun, Domenica Reamer, MD as Consulting Physician (Internal Medicine) Practice, [facility], Nobie Putnam, RN as Case Manager (General Practice)  REFERRING PROVIDER: Valerie Roys, DO   CHIEF COMPLAINTS/REASON FOR VISIT:  Evaluation of history of PE  HISTORY OF PRESENTING ILLNESS:   Justin Griffith is a  81 y.o.  male with PMH listed below was seen in consultation at the request of  Valerie Roys, DO  for evaluation of history of pulmonary embolism.   Patient has extensive medical problems, including  BPH, status post HoLEP procedure 5/36/1443,  diastolic CHF, CAD status post CABG, OSA, DVT, COPD, dyslipidemia and hypertension, and history pulmonary embolism.   09/19/21 1 day after his urology surgery, he was found down in his front yard on the ground and was last seen around lunchtime. He was febrile and confused with EMS. He was hypoxic in ED  09/19/2021  Chest CTA revealed the following  1. Positive for pulmonary emboli. There are multiple bilateral segmental pulmonary emboli. Positive for acute PE with CT evidence of right heart strain (RV/LV Ratio = 1.15) consistent with at least submassive (intermediate risk) PE. The presence of right heart strain has been associated with an increased risk of morbidity and mortality. Please refer to the "Code PE Focused" order set in EPIC. 2. Lungs show evidence of vascular shunting and atelectasis, but no evidence of an infarct, pleural effusion or pneumothorax. 3. Mild enlargement of the main pulmonary artery tp 3.9 cm  Patient was treated with anticoagulation with heparin gtt, switched to  Eliquis at discharge. Vascular surgeon recommend no intervention. He also was found to have UTI and was treated with antibiotics.   He has no been on anticoagulation with Eliquis 67m BID for 5.5 months. He was referred to establish care for determination of duration of anticoagulation.    MEDICAL HISTORY:  Past Medical History:  Diagnosis Date   Acute urinary retention    Arthritis    Asthma 2000   Back pain    Colon polyp 2012   COPD (chronic obstructive pulmonary disease) (HCC)    Emphysema of lung (HCC)    Heart murmur    Hernia 2000   History of cold sores 05/06/2020   History of colon cancer    Hyperlipidemia    Hypertension    Personal history of malignant neoplasm of large intestine    Personal history of tobacco use, presenting hazards to health    Septic shock ([place] 11/18/2021   Sleep apnea    Special screening for malignant neoplasms, colon    [place] 2 [place] proteinuria [place] 10/11/2014    SURGICAL HISTORY: Past Surgical History:  Procedure Laterality Date   CARDIAC CATHETERIZATION Left 09/25/2015   Procedure: Left Heart Cath and Coronary Angiography;  Surgeon: DYolonda Kida MD;  [place];  Service: Cardiovascular;  Laterality: Left;   CHOLECYSTECTOMY  1970   COLON SURGERY  07-16-99   sigmoid colon resection with primary anastomosis, chemotherapy for metastatic disease   COLONOSCOPY  2001, 2012   Dr BBary Castilla tubular adenoma of the cecum and ascending colon in 2012.   COLONOSCOPY WITH PROPOFOL N/A 02/12/2016   Procedure:  COLONOSCOPY WITH PROPOFOL;  Surgeon: Robert Bellow, MD;  Location: [facility] ENDOSCOPY;  Service: Endoscopy;  Laterality: N/A;   CORONARY STENT INTERVENTION N/A 05/17/2020   Procedure: CORONARY STENT INTERVENTION;  Surgeon: Yolonda Kida, MD;  Location: [place] [facility];  Service: Cardiovascular;  Laterality: N/A;  LAD   ESOPHAGOGASTRODUODENOSCOPY (EGD) WITH PROPOFOL N/A 02/12/2016   Procedure:  ESOPHAGOGASTRODUODENOSCOPY (EGD) WITH PROPOFOL;  Surgeon: Robert Bellow, MD;  Location: [facility] ENDOSCOPY;  Service: Endoscopy;  Laterality: N/A;   HERNIA REPAIR Right    right inguinal hernia repair   IR RADIOLOGIST EVAL & MGMT  02/27/2022   JOINT REPLACEMENT Bilateral    knee replacement   KNEE SURGERY Bilateral 2010   LEFT HEART CATH AND CORONARY ANGIOGRAPHY N/A 05/17/2020   Procedure: LEFT HEART CATH AND CORONARY ANGIOGRAPHY possible PCI and stent;  Surgeon: Yolonda Kida, MD;  Location: [place] [facility];  Service: Cardiovascular;  Laterality: N/A;   MYRINGOTOMY WITH TUBE PLACEMENT Bilateral 08/16/2014   Procedure: MYRINGOTOMY WITH TUBE PLACEMENT;  Surgeon: Carloyn Manner, MD;  Location: [facility] ORS;  Service: ENT;  Laterality: Bilateral;   PERIPHERAL VASCULAR CATHETERIZATION Right 09/04/2015   Procedure: Lower Extremity Angiography;  Surgeon: Katha Cabal, MD;  Location: [place] [facility];  Service: Cardiovascular;  Laterality: Right;   PERIPHERAL VASCULAR CATHETERIZATION  09/04/2015   Procedure: Lower Extremity Intervention;  Surgeon: Katha Cabal, MD;  Location: [place] [facility];  Service: Cardiovascular;;   TONSILLECTOMY     TYMPANOSTOMY TUBE PLACEMENT      SOCIAL HISTORY: Social History   Socioeconomic History   Marital status: Married    Spouse name: Not on file   Number of children: Not on file   Years of education: Not on file   Highest education level: GED or equivalent  Occupational History   Occupation: retired    Comment: [employer]  Tobacco Use   Smoking status: Former    Packs/day: 1.00    Years: 30.00    Total pack years: 30.00    Types: Cigarettes    Quit date: 03/23/1989    Years since quitting: 32.9    Passive exposure: Past   Smokeless tobacco: Never  Vaping Use   Vaping Use: Never used  Substance and Sexual Activity   Alcohol use: No    Alcohol/week: 0.0 standard drinks of alcohol   Drug use: No   Sexual activity: Not Currently   Other Topics Concern   Not on file  Social History Narrative   [employer]    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    Social Determinants of Health   Financial Resource Strain: Low Risk  (03/03/2022)   Overall Financial Resource Strain (CARDIA)    Difficulty of Paying Living Expenses: Not hard at all  Food Insecurity: No Food Insecurity (03/03/2022)   Hunger Vital Sign    Worried About Running Out of Food in the Last Year: Never true    [place] in the Last Year: Never true  Transportation Needs: No Transportation Needs (03/03/2022)   PRAPARE - Hydrologist (Medical): No    Lack of Transportation (Non-Medical): No  Physical Activity: Inactive (03/03/2022)   Exercise Vital Sign    Days of Exercise per Week: 0 days    Minutes of Exercise per Session: 0 min  Stress: No Stress Concern Present (03/03/2022)   [place]    Feeling of Stress : Only a  little  Social Connections: Moderately Integrated (03/03/2022)   Social Connection and Isolation Panel [NHANES]    Frequency of Communication with Friends and Family: More than three times a week    Frequency of Social Gatherings with Friends and Family: More than three times a week    Attends Religious Services: More than 4 times per year    Active Member of [employer] or Organizations: No    Attends Archivist Meetings: Never    Marital Status: Married  Human resources officer Violence: Not At Risk (03/03/2022)   Humiliation, Afraid, Rape, and Kick questionnaire    Fear of Current or Ex-Partner: No    Emotionally Abused: No    Physically Abused: No    Sexually Abused: No    FAMILY HISTORY: Family History  Problem Relation Age of Onset   Diabetes Father    Esophageal cancer Mother    Alzheimer's disease Paternal Uncle     ALLERGIES:  is allergic to farxiga [dapagliflozin], dulaglutide, liraglutide, and jardiance [empagliflozin].  MEDICATIONS:   Current Outpatient Medications  Medication Sig Dispense Refill   apixaban (ELIQUIS) 2.5 MG TABS tablet Take 1 tablet (2.5 mg total) by mouth 2 (two) times daily. For blood thinner 60 tablet 1   ascorbic acid (VITAMIN C) 500 MG tablet Take 1 tablet (500 mg total) by mouth daily. To keep your immune system up 30 tablet 0   blood glucose meter kit and supplies 1 each by Other route daily. Dispense based on patient and insurance preference. Use up to four times daily as directed. (FOR ICD-10 E10.9, E11.9). 1 each 0   cloNIDine (CATAPRES) 0.1 MG tablet Take 1 tablet (0.1 mg total) by mouth 2 (two) times daily. For blood pressure 60 tablet 1   gabapentin (NEURONTIN) 100 MG capsule Take 1 capsule (100 mg total) by mouth 3 (three) times daily. For nerve pain and itching 90 capsule 1   hydrALAZINE (APRESOLINE) 50 MG tablet 1 tab 3 times a day for blood pressure 90 tablet 1   losartan (COZAAR) 25 MG tablet Take 1 tablet (25 mg total) by mouth daily. 30 tablet 1   Multiple Vitamin (MULTIVITAMIN WITH MINERALS) TABS tablet Take 1 tablet by mouth daily. For nutrition 30 tablet 2   nystatin (MYCOSTATIN/NYSTOP) powder Apply 1 Application topically 3 (three) times daily. 15 g 0   omeprazole (PRILOSEC) 40 MG capsule Take 1 capsule (40 mg total) by mouth daily. For heartburn 30 capsule 1   polyethylene glycol powder (GLYCOLAX/MIRALAX) 17 GM/SCOOP powder Take 17 g by mouth 2 (two) times daily as needed. 3350 g 1   rosuvastatin (CRESTOR) 10 MG tablet Take 1 tablet (10 mg total) by mouth at bedtime. For cholesterol 30 tablet 1   sodium hypochlorite (DAKIN'S 1/2 STRENGTH) external solution Apply 1 Application topically daily at 12 noon. Topically to wound daily to clean.     Torsemide 40 MG TABS Take by mouth.     umeclidinium bromide (INCRUSE ELLIPTA) 62.5 MCG/ACT AEPB Inhale 1 puff into the lungs daily. For your breathing 30 each 3   Zinc Oxide 10 % OINT Apply topically.     No current facility-administered  medications for this visit.    Review of Systems  Constitutional:  Positive for fatigue. Negative for chills, fever and unexpected weight change.  HENT:   Negative for hearing loss and voice change.   Eyes:  Negative for eye problems and icterus.  Respiratory:  Negative for chest tightness, cough and shortness of breath.  Cardiovascular:  Positive for leg swelling. Negative for chest pain.  Gastrointestinal:  Negative for abdominal distention and abdominal pain.  Endocrine: Negative for hot flashes.  Genitourinary:  Positive for difficulty urinating. Negative for dysuria and frequency.   Musculoskeletal:  Positive for back pain. Negative for arthralgias.  Skin:  Negative for itching and rash.  Neurological:  Negative for light-headedness and numbness.  Hematological:  Negative for adenopathy. Bruises/bleeds easily.  Psychiatric/Behavioral:  Negative for confusion.    PHYSICAL EXAMINATION: Vitals:   03/10/22 1515  BP: (!) 201/93  Pulse: 64  Resp: 18  Temp: 97.8 F (36.6 C)   Filed Weights   03/10/22 1515  Weight: 244 lb 1.6 oz (110.7 kg)    Physical Exam Constitutional:      General: He is not in acute distress.    Appearance: He is obese.     Comments: Patient sits in the wheel chair.   HENT:     Head: Normocephalic and atraumatic.  Eyes:     General: No scleral icterus. Cardiovascular:     Rate and Rhythm: Normal rate.  Pulmonary:     Effort: Pulmonary effort is normal. No respiratory distress.     Breath sounds: No wheezing.  Abdominal:     General: Bowel sounds are normal. There is no distension.     Palpations: Abdomen is soft.  Genitourinary:    Comments: + Foley catheter Musculoskeletal:        General: No deformity. Normal range of motion.     Cervical back: Normal range of motion and neck supple.     Right lower leg: Edema present.  Skin:    General: Skin is warm and dry.     Findings: No erythema or rash.  Neurological:     Mental Status: He is  alert and oriented to person, place, and time. Mental status is at baseline.     Cranial Nerves: No cranial nerve deficit.     Coordination: Coordination normal.  Psychiatric:        Mood and Affect: Mood normal.     LABORATORY DATA:  I have reviewed the data as listed    Latest Ref Rng & Units 03/10/2022    3:51 PM 02/04/2022    2:40 PM 01/30/2022   12:45 AM  CBC  WBC 4.0 - 10.5 K/uL 6.2  7.0  8.3   Hemoglobin 13.0 - 17.0 g/dL 11.6  12.4  11.7   Hematocrit 39.0 - 52.0 % 35.6  37.7  35.7   Platelets 150 - 400 K/uL 312  317  276       Latest Ref Rng & Units 02/23/2022    4:11 PM 02/04/2022    2:40 PM 02/02/2022   12:49 AM  CMP  Glucose 70 - 99 mg/dL 149  180  209   BUN 8 - 27 mg/dL _0 Creatinine 0.76 - 1.27 mg/dL 1.04  1.21  1.07   Sodium 134 - 144 mmol/L 140  141  138   Potassium 3.5 - 5.2 mmol/L 3.5  3.5  3.1   Chloride 96 - 106 mmol/L 104  101  106   CO2 20 - 29 mmol/L _1 Calcium 8.6 - 10.2 mg/dL 10.0  10.2  9.8   Total Protein 6.0 - 8.5 g/dL  6.6    Total Bilirubin 0.0 - 1.2 mg/dL  0.3    Alkaline Phos 44 - 121 IU/L  63  AST 0 - 40 IU/L  12    ALT 0 - 44 IU/L  9        RADIOGRAPHIC STUDIES: I have personally reviewed the radiological images as listed and agreed with the findings in the report. IR Radiologist Eval & Mgmt  Result Date: 02/27/2022 EXAM: NEW PATIENT OFFICE VISIT CHIEF COMPLAINT: See Epic note. HISTORY OF PRESENT ILLNESS: See Epic note. REVIEW OF SYSTEMS: See Epic note. PHYSICAL EXAMINATION: See Epic note. ASSESSMENT AND PLAN: See Epic note. Ruthann Cancer, MD [facility] Radiology Electronically Signed   By: Ruthann Cancer M.D.   On: 02/27/2022 12:09   CT ABDOMEN PELVIS WO CONTRAST  Result Date: 01/31/2022 CLINICAL DATA:  Abdominal pain after Foley catheter replacement. EXAM: CT ABDOMEN AND PELVIS WITHOUT CONTRAST TECHNIQUE: Multidetector CT imaging of the abdomen and pelvis was  performed following the standard protocol without IV contrast. RADIATION DOSE REDUCTION: This exam was performed according to the departmental dose-optimization program which includes automated exposure control, adjustment of the mA and/or kV according to patient size and/or use of iterative reconstruction technique. COMPARISON:  September 30, 2021 and February 11, 2020 FINDINGS: Lower chest: A stable, likely benign 7 mm noncalcified lung nodule is seen within the right lung base. Hepatobiliary: No focal liver abnormality is seen. Status post cholecystectomy. No biliary dilatation. Pancreas: Unremarkable. No pancreatic ductal dilatation or surrounding inflammatory changes. Spleen: Normal in size without focal abnormality. Adrenals/Urinary Tract: Adrenal glands are unremarkable. Kidneys are normal in size, without focal lesions. Mild to moderate severity bilateral hydronephrosis and hydroureter are seen. A mild amount of air is seen within the lumen of a markedly distended urinary bladder. A Foley catheter is in place with its distal tip and insufflator bulb noted within an enlarged prostate gland. Stomach/Bowel: Stomach is within normal limits. The appendix is not clearly identified. Surgically anastomosed bowel is seen within the mid sigmoid colon. A large amount of stool is seen throughout the sigmoid colon. No evidence of bowel wall thickening, distention, or inflammatory changes. Vascular/Lymphatic: Aortic atherosclerosis. No enlarged abdominal or pelvic lymph nodes. Reproductive: The prostate gland is markedly enlarged. Other: No abdominal wall hernia or abnormality. No abdominopelvic ascites. Musculoskeletal: Marked severity multilevel degenerative changes are seen throughout the lumbar spine. IMPRESSION: 1. Malpositioned Foley catheter, as described above. 2. Markedly enlarged prostate gland with subsequent bladder outlet obstruction. 3. Evidence of prior cholecystectomy. 4. Postoperative changes and large amount  of stool involving the sigmoid colon. 5. Stable, likely benign 7 mm noncalcified right basilar lung nodule. 6. Aortic atherosclerosis. Aortic Atherosclerosis (ICD10-I70.0). Electronically Signed   By: Virgina Norfolk M.D.   On: 01/31/2022 00:22       ASSESSMENT & PLAN:   History of pulmonary embolism 09/18/21 No obvious provoking events prior to diagnosis of pulmonary embolism.  He has multiple risk factors of recurrent thrombosis, history of unprovoked PE, history embolic stroke, sedentary lifestyle Check hypercoagulable work up - factor V, prothrombin gene, APS panel, protein C and S.  If negative, I recommend patient to decrease to Elqiuis 2.49m BID for prophylaxis.   Iron deficiency anemia Iron panel is consistent with iron deficiency anemia   Orders Placed This Encounter  Procedures   ANTIPHOSPHOLIPID SYNDROME PROF    Standing Status:   Future    Number of Occurrences:   1    Standing Expiration Date:   03/11/2023   Factor 5 leiden    Standing Status:   Future    Number of Occurrences:  1    Standing Expiration Date:   03/11/2023   Protein C activity    Standing Status:   Future    Number of Occurrences:   1    Standing Expiration Date:   03/11/2023   Protein S, total and free    Standing Status:   Future    Number of Occurrences:   1    Standing Expiration Date:   03/11/2023   Prothrombin gene mutation    Standing Status:   Future    Number of Occurrences:   1    Standing Expiration Date:   03/11/2023   Folate    Standing Status:   Future    Number of Occurrences:   1    Standing Expiration Date:   03/11/2023   Technologist smear review    Standing Status:   Future    Number of Occurrences:   1    Standing Expiration Date:   03/11/2023    Order Specific Question:   Clinical information:    Answer:   anemia   Ferritin    Standing Status:   Future    Number of Occurrences:   1    Standing Expiration Date:   09/09/2022   Iron and TIBC    Standing Status:    Future    Number of Occurrences:   1    Standing Expiration Date:   03/11/2023   CBC with Differential/Platelet    Standing Status:   Future    Number of Occurrences:   1    Standing Expiration Date:   03/11/2023    All questions were answered. The patient knows to call the clinic with any problems, questions or concerns.  Valerie Roys, DO    Thank you for this kind referral and the opportunity to participate in the care of this patient. A copy of today's note is routed to referring provider   Earlie Server, MD, PhD [facility] Hematology Oncology 03/10/2022

## 2022-03-12 ENCOUNTER — Ambulatory Visit: Payer: Self-pay | Admitting: *Deleted

## 2022-03-12 NOTE — Telephone Encounter (Signed)
Left message for patient to make him aware of Dr.Johnson's recommendations. Advised patient to give our office a call back if he has any concerns or questions before his scheduled appointment tomorrow with provider.

## 2022-03-12 NOTE — Telephone Encounter (Signed)
Spoke with Shirlean Mylar from [facility] and she says patient takes his medications as she reviews his pill pack. She says he seems very responsible with his medications, as he has them labeled "morning, noon and night." Shirlean Mylar says he still having issues with blood pressure after allowing patient to sit a while. Shirlean Mylar says she is concerned as patient is still having swelling in his right arm and left leg. Shirlean Mylar says patient is having issues with catheter and says patient diet is poor as he is eating meals from "[employer] Whe[employer]" and the menu choices are high in sodium and she just wanted to make the provider aware. She is scheduled to go back to patient's home today.

## 2022-03-12 NOTE — Telephone Encounter (Signed)
Reason for Disposition  [1] Systolic BP  >= 610 OR Diastolic >= 960 AND [4] cardiac (e.g., breathing difficulty, chest pain) or neurologic symptoms (e.g., new-onset blurred or double vision, unsteady gait)    He is refusing to go to ED.   He has been educated on going to the ED.   When to call 911 but he continues to refuse ED. Having pain in left eye for 3 days now intermittently.  Answer Assessment - Initial Assessment Questions 1. BLOOD PRESSURE: "What is the blood pressure?" "Did you take at least two measurements 5 minutes apart?"     We have called every day this week.   Even after taking his BP med it's elevated.   Right leg and left arm are swollen is an ongoing thing.  Not worse but not getting any better.   Not on diuretic.     On and off pain in left eye for 3 days now.   It happened twice.   Vision not affected.    BP 230/80   medication taken over an hour and a half ago.   He is refusing the ED.   He wants to see Dr. Wynetta Emery. 2. ONSET: "When did you take your blood pressure?"     A few minutes ago manually by Robin. 3. HOW: "How did you take your blood pressure?" (e.g., automatic home BP monitor, visiting nurse)     Not asked 4. HISTORY: "Do you have a history of high blood pressure?"     Yes 5. MEDICINES: "Are you taking any medicines for blood pressure?" "Have you missed any doses recently?"     Yes 6. OTHER SYMPTOMS: "Do you have any symptoms?" (e.g., blurred vision, chest pain, difficulty breathing, headache, weakness)     See above about eye. 7. PREGNANCY: "Is there any chance you are pregnant?" "When was your last menstrual period?"     N/A  Protocols used: Blood Pressure - High-A-AH

## 2022-03-12 NOTE — Telephone Encounter (Signed)
Can you please tell him I think it's a good idea to go to the ER, but that I will still see him tomorrow?

## 2022-03-12 NOTE — Telephone Encounter (Signed)
Please make sure he is taking all of his medication at home. Can we please get home health involved

## 2022-03-12 NOTE — Telephone Encounter (Signed)
  Chief Complaint: Shirlean Mylar, OT with [facility] called in while still with pt.   Reporting his BP 230/80.    Symptoms: Pain on and off in his left eye for 3 days.   Frequency: Intermittent pain in left eye     BP elevated all week.  Refused ED Pertinent Negatives: Patient denies having chest pain or shortness of breath. Disposition: '[]'$ ED /'[]'$ Urgent Care (no appt availability in office) / '[]'$ Appointment(In office/virtual)/ '[]'$  [facility]/ '[]'$ Home Care/ '[x]'$ Refused Recommended Disposition /'[]'$ [place] [facility]/ '[]'$  Follow-up with PCP Additional Notes: Pt instructed to go to the ED per protocol however he refuses.     [facility] has instructed him several times on why he needed to go to the ED because they have called in every day this week to report his BP being very elevated.   He continues to refuse the ED disposition.

## 2022-03-12 NOTE — Telephone Encounter (Signed)
Patient is scheduled for appointment Friday.

## 2022-03-13 ENCOUNTER — Ambulatory Visit (INDEPENDENT_AMBULATORY_CARE_PROVIDER_SITE_OTHER): Payer: PPO | Admitting: Family Medicine

## 2022-03-13 ENCOUNTER — Telehealth: Payer: Self-pay | Admitting: Family Medicine

## 2022-03-13 ENCOUNTER — Encounter: Payer: Self-pay | Admitting: Family Medicine

## 2022-03-13 VITALS — BP 196/74 | HR 86 | Temp 98.2°F | Ht 71.0 in | Wt 240.5 lb

## 2022-03-13 DIAGNOSIS — I129 Hypertensive chronic kidney disease with stage 1 through stage 4 chronic kidney disease, or unspecified chronic kidney disease: Secondary | ICD-10-CM

## 2022-03-13 MED ORDER — GABAPENTIN 300 MG PO CAPS: 300.0000 mg | ORAL_CAPSULE | Freq: Three times a day (TID) | ORAL | 3 refills | Status: DC

## 2022-03-13 NOTE — Telephone Encounter (Signed)
Patient has an appointment today with provider at 2:40 pm.

## 2022-03-13 NOTE — Telephone Encounter (Signed)
Raquel Sarna was notified of Dr.Johnson's recommendations and says they will hold off until patient's blood pressure is under control. Raquel Sarna verbalized understanding.

## 2022-03-13 NOTE — Telephone Encounter (Signed)
Please let her know that he did not start his torsemide or his losartan which is why his blood pressure is still so high. I'm going to get him to pick them up on his way home today, and I will see him next week, but this is likely why his BP is no better. OK to hold PT until I see him next week. Whoever is the nurse going out to see him- please make sure they know there will also be 2 additional meds in his AM pills (torsemide and losartan) that will not be in the pill pack until 03/28/22 when his new pill pack is packed. If they are able to help with those pills until they are pill packed that'd be fantastic.

## 2022-03-13 NOTE — Telephone Encounter (Signed)
Copied from [place] [phone]. Topic: General - Other >> Mar 12, 2022  2:02 PM Everette C wrote: Caller/Agency: Raquel Sarna / [facility]  Callback Number: [phone] Requesting OT/PT/Skilled Nursing/Social Work/Speech Therapy: PT  Raquel Sarna would like to hold the patient's physical therapy treatments until the patient's blood pressure under control  Raquel Sarna would like to discuss resumption of services with a member of clinical staff when possible   Please contact further when available

## 2022-03-13 NOTE — Progress Notes (Signed)
BP (!) 196/74   Pulse 86   Temp 98.2 F (36.8 C) (Oral)   Ht '5\' 11"'$  (1.803 m)   Wt 240 lb 8 oz (109.1 kg)   SpO2 97%   BMI 33.54 kg/m    Subjective:    Patient ID: Justin Griffith, male    DOB: 07-29-1940, 81 y.o.   MRN: [id number]  HPI: Justin Griffith is a 81 y.o. male  Chief Complaint  Patient presents with   Hypertension   HYPERTENSION  Hypertension status: uncontrolled  Satisfied with current treatment? no Duration of hypertension: chronic BP monitoring frequency:  a few times a week BP range: >235 systolic BP medication side effects:  no Medication compliance: fair compliance Previous BP meds: clonidine, hydralazine, losartan, torsemide Aspirin: no Recurrent headaches: no Visual changes: yes Palpitations: no Dyspnea: yes Chest pain: no Lower extremity edema: yes Dizzy/lightheaded: no   Relevant past medical, surgical, family and social history reviewed and updated as indicated. Interim medical history since our last visit reviewed. Allergies and medications reviewed and updated.  Review of Systems  Constitutional: Negative.   Respiratory: Negative.    Cardiovascular:  Positive for leg swelling. Negative for chest pain and palpitations.  Gastrointestinal: Negative.   Musculoskeletal:  Positive for arthralgias, joint swelling and myalgias. Negative for back pain, gait problem, neck pain and neck stiffness.  Skin: Negative.   Neurological: Negative.   Psychiatric/Behavioral: Negative.      Per HPI unless specifically indicated above     Objective:    BP (!) 196/74   Pulse 86   Temp 98.2 F (36.8 C) (Oral)   Ht '5\' 11"'$  (1.803 m)   Wt 240 lb 8 oz (109.1 kg)   SpO2 97%   BMI 33.54 kg/m   Wt Readings from Last 3 Encounters:  03/13/22 240 lb 8 oz (109.1 kg)  03/10/22 244 lb 1.6 oz (110.7 kg)  03/02/22 226 lb (102.5 kg)    Physical Exam Vitals and nursing note reviewed.  Constitutional:      General: He is not in acute distress.    Appearance:  Normal appearance. He is obese. He is not ill-appearing, toxic-appearing or diaphoretic.  HENT:     Head: Normocephalic and atraumatic.     Right Ear: External ear normal.     Left Ear: External ear normal.     Nose: Nose normal.     Mouth/Throat:     Mouth: Mucous membranes are moist.     Pharynx: Oropharynx is clear.  Eyes:     General: No scleral icterus.       Right eye: No discharge.        Left eye: No discharge.     Extraocular Movements: Extraocular movements intact.     Conjunctiva/sclera: Conjunctivae normal.     Pupils: Pupils are equal, round, and reactive to light.  Cardiovascular:     Rate and Rhythm: Normal rate and regular rhythm.     Pulses: Normal pulses.     Heart sounds: Normal heart sounds. No murmur heard.    No friction rub. No gallop.  Pulmonary:     Effort: Pulmonary effort is normal. No respiratory distress.     Breath sounds: Normal breath sounds. No stridor. No wheezing, rhonchi or rales.  Chest:     Chest wall: No tenderness.  Musculoskeletal:        General: Normal range of motion.     Cervical back: Normal range of motion and neck supple.  Skin:    General: Skin is warm and dry.     Capillary Refill: Capillary refill takes less than 2 seconds.     Coloration: Skin is not jaundiced or pale.     Findings: No bruising, erythema, lesion or rash.  Neurological:     General: No focal deficit present.     Mental Status: He is alert and oriented to person, place, and time. Mental status is at baseline.  Psychiatric:        Mood and Affect: Mood normal.        Behavior: Behavior normal.        Thought Content: Thought content normal.        Judgment: Judgment normal.     Results for orders placed or performed in visit on 03/10/22  Iron and TIBC  Result Value Ref Range   Iron 46 45 - 182 ug/dL   TIBC 363 250 - 450 ug/dL   Saturation Ratios 13 (L) 17.9 - 39.5 %   UIBC 317 ug/dL  Ferritin  Result Value Ref Range   Ferritin 23 (L) 24 - 336 ng/mL   Folate  Result Value Ref Range   Folate 17.1 >5.9 ng/mL  Protein S, total and free  Result Value Ref Range   Protein S Ag, Total 119 60 - 150 %   Protein S Ag, Free 94 61 - 136 %  Protein C activity  Result Value Ref Range   Protein C Activity 100 73 - 180 %  ANTIPHOSPHOLIPID SYNDROME PROF  Result Value Ref Range   Anticardiolipin IgG 14 0 - 14 GPL U/mL   Anticardiolipin IgM <9 0 - 12 MPL U/mL   PTT Lupus Anticoagulant 29.6 0.0 - 43.5 sec   DRVVT 31.5 0.0 - 47.0 sec   Lupus Anticoag Interp Comment:   CBC with Differential/Platelet  Result Value Ref Range   WBC 6.2 4.0 - 10.5 K/uL   RBC 4.06 (L) 4.22 - 5.81 MIL/uL   Hemoglobin 11.6 (L) 13.0 - 17.0 g/dL   HCT 35.6 (L) 39.0 - 52.0 %   MCV 87.7 80.0 - 100.0 fL   MCH 28.6 26.0 - 34.0 pg   MCHC 32.6 30.0 - 36.0 g/dL   RDW 13.6 11.5 - 15.5 %   Platelets 312 150 - 400 K/uL   nRBC 0.0 0.0 - 0.2 %   Neutrophils Relative % 61 %   Neutro Abs 3.8 1.7 - 7.7 K/uL   Lymphocytes Relative 25 %   Lymphs Abs 1.5 0.7 - 4.0 K/uL   Monocytes Relative 8 %   Monocytes Absolute 0.5 0.1 - 1.0 K/uL   Eosinophils Relative 5 %   Eosinophils Absolute 0.3 0.0 - 0.5 K/uL   Basophils Relative 1 %   Basophils Absolute 0.1 0.0 - 0.1 K/uL   Immature Granulocytes 0 %   Abs Immature Granulocytes 0.02 0.00 - 0.07 K/uL  Technologist smear review  Result Value Ref Range   WBC MORPHOLOGY Normal RBC, WBC, and platelet    RBC MORPHOLOGY Normal RBC, WBC, and platelet    Plt Morphology Normal RBC, WBC, and platelet    Clinical Information anemia       Assessment & Plan:   Problem List Items Addressed This Visit       Genitourinary   Benign hypertensive renal disease - Primary    Has not picked up his torsemide or his losartan. Will get him started on these, coordinated with pharmacy and home health about picking up  medicine and getting them into pill pack. Due for refill of his pill pack on 03/27/21- will get enough medicine to get to pill pack refill.  Instructions about taking 2 other pills written on his pill pack. Continue all other medicine. Will recheck blood pressure on Wednesday and adjust medicine as needed. Call with any concerns.         Follow up plan: Return in about 1 week (around 03/20/2022).   >30 minutes spent with patient today

## 2022-03-13 NOTE — Assessment & Plan Note (Addendum)
Has not picked up his torsemide or his losartan. Will get him started on these, coordinated with pharmacy and home health about picking up medicine and getting them into pill pack. Due for refill of his pill pack on 03/27/21- will get enough medicine to get to pill pack refill. Instructions about taking 2 other pills written on his pill pack. Continue all other medicine. Will recheck blood pressure on Wednesday and adjust medicine as needed. Call with any concerns.

## 2022-03-17 LAB — FACTOR 5 LEIDEN

## 2022-03-17 LAB — PROTHROMBIN GENE MUTATION

## 2022-03-20 ENCOUNTER — Ambulatory Visit (INDEPENDENT_AMBULATORY_CARE_PROVIDER_SITE_OTHER): Payer: PPO | Admitting: Family Medicine

## 2022-03-20 ENCOUNTER — Encounter: Payer: Self-pay | Admitting: Family Medicine

## 2022-03-20 VITALS — BP 176/76 | HR 68 | Temp 98.2°F | Ht 71.0 in | Wt 236.5 lb

## 2022-03-20 DIAGNOSIS — I129 Hypertensive chronic kidney disease with stage 1 through stage 4 chronic kidney disease, or unspecified chronic kidney disease: Secondary | ICD-10-CM | POA: Diagnosis not present

## 2022-03-20 DIAGNOSIS — B001 Herpesviral vesicular dermatitis: Secondary | ICD-10-CM | POA: Diagnosis not present

## 2022-03-20 MED ORDER — LOSARTAN POTASSIUM 50 MG PO TABS: 50.0000 mg | ORAL_TABLET | Freq: Every day | ORAL | 3 refills | Status: DC

## 2022-03-20 MED ORDER — VALACYCLOVIR HCL 1 G PO TABS: 1000.0000 mg | ORAL_TABLET | Freq: Two times a day (BID) | ORAL | 6 refills | Status: DC

## 2022-03-20 NOTE — Assessment & Plan Note (Signed)
Better. But still running high. Has been out of his torsemide today due to losing the bottle- called over to [facility] and they will get him enough to bridge to his pill pack. Will increase his losartan to '50mg'$  and recheck in about a week. Continue to monitor. Call with any concerns.

## 2022-03-20 NOTE — Progress Notes (Signed)
BP (!) 176/76   Pulse 68   Temp 98.2 F (36.8 C) (Oral)   Ht '5\' 11"'$  (1.803 m)   Wt 236 lb 8 oz (107.3 kg)   SpO2 97%   BMI 32.99 kg/m    Subjective:    Patient ID: Justin Griffith, male    DOB: 1940-08-09, 81 y.o.   MRN: [id number]  HPI: Justin Griffith is a 81 y.o. male  Chief Complaint  Patient presents with   Hypertension   Medication Problem    Patient says he lost his bottle of Torsemide prescription and says he wants to discuss with provider at today's visit. Patient says he really needs it as he notices it has helped with his currently swelling.    HYPERTENSION- has gotten back on his losartan, but not his torsemide as he lost that bottle.  Hypertension status: better  Satisfied with current treatment? no Duration of hypertension: chronic BP monitoring frequency:  a few times a week- has been running in the 160s at home BP medication side effects:  no Medication compliance: fair compliance Previous BP meds:losartan, hydralazine, clonidine, torsemide Aspirin: no Recurrent headaches: no Visual changes: no Palpitations: no Dyspnea: no Chest pain: no Lower extremity edema: no Dizzy/lightheaded: no  Relevant past medical, surgical, family and social history reviewed and updated as indicated. Interim medical history since our last visit reviewed. Allergies and medications reviewed and updated.  Review of Systems  Constitutional: Negative.   Respiratory: Negative.    Cardiovascular: Negative.   Gastrointestinal: Negative.   Musculoskeletal: Negative.   Neurological: Negative.   Psychiatric/Behavioral: Negative.      Per HPI unless specifically indicated above     Objective:    BP (!) 176/76   Pulse 68   Temp 98.2 F (36.8 C) (Oral)   Ht '5\' 11"'$  (1.803 m)   Wt 236 lb 8 oz (107.3 kg)   SpO2 97%   BMI 32.99 kg/m   Wt Readings from Last 3 Encounters:  03/20/22 236 lb 8 oz (107.3 kg)  03/13/22 240 lb 8 oz (109.1 kg)  03/10/22 244 lb 1.6 oz (110.7 kg)     Physical Exam Vitals and nursing note reviewed.  Constitutional:      General: He is not in acute distress.    Appearance: Normal appearance. He is obese. He is not ill-appearing, toxic-appearing or diaphoretic.  HENT:     Head: Normocephalic and atraumatic.     Right Ear: External ear normal.     Left Ear: External ear normal.     Nose: Nose normal.     Mouth/Throat:     Mouth: Mucous membranes are moist.     Pharynx: Oropharynx is clear.  Eyes:     General: No scleral icterus.       Right eye: No discharge.        Left eye: No discharge.     Extraocular Movements: Extraocular movements intact.     Conjunctiva/sclera: Conjunctivae normal.     Pupils: Pupils are equal, round, and reactive to light.  Cardiovascular:     Rate and Rhythm: Normal rate and regular rhythm.     Pulses: Normal pulses.     Heart sounds: Normal heart sounds. No murmur heard.    No friction rub. No gallop.  Pulmonary:     Effort: Pulmonary effort is normal. No respiratory distress.     Breath sounds: Normal breath sounds. No stridor. No wheezing, rhonchi or rales.  Chest:  Chest wall: No tenderness.  Musculoskeletal:        General: Normal range of motion.     Cervical back: Normal range of motion and neck supple.     Right lower leg: Edema (trace) present.     Left lower leg: Edema (trace) present.  Skin:    General: Skin is warm and dry.     Capillary Refill: Capillary refill takes less than 2 seconds.     Coloration: Skin is not jaundiced or pale.     Findings: No bruising, erythema, lesion or rash.     Comments: Fever blisters on his lips  Neurological:     General: No focal deficit present.     Mental Status: He is alert and oriented to person, place, and time. Mental status is at baseline.  Psychiatric:        Mood and Affect: Mood normal.        Behavior: Behavior normal.        Thought Content: Thought content normal.        Judgment: Judgment normal.     Results for orders  placed or performed in visit on 03/10/22  Iron and TIBC  Result Value Ref Range   Iron 46 45 - 182 ug/dL   TIBC 363 250 - 450 ug/dL   Saturation Ratios 13 (L) 17.9 - 39.5 %   UIBC 317 ug/dL  Ferritin  Result Value Ref Range   Ferritin 23 (L) 24 - 336 ng/mL  Folate  Result Value Ref Range   Folate 17.1 >5.9 ng/mL  Prothrombin gene mutation  Result Value Ref Range   Recommendations-PTGENE: Comment    Reviewed by: Comment   Protein S, total and free  Result Value Ref Range   Protein S Ag, Total 119 60 - 150 %   Protein S Ag, Free 94 61 - 136 %  Protein C activity  Result Value Ref Range   Protein C Activity 100 73 - 180 %  Factor 5 leiden  Result Value Ref Range   Recommendations-F5LEID: Comment    Reviewed By: Comment   ANTIPHOSPHOLIPID SYNDROME PROF  Result Value Ref Range   Anticardiolipin IgG 14 0 - 14 GPL U/mL   Anticardiolipin IgM <9 0 - 12 MPL U/mL   PTT Lupus Anticoagulant 29.6 0.0 - 43.5 sec   DRVVT 31.5 0.0 - 47.0 sec   Lupus Anticoag Interp Comment:   CBC with Differential/Platelet  Result Value Ref Range   WBC 6.2 4.0 - 10.5 K/uL   RBC 4.06 (L) 4.22 - 5.81 MIL/uL   Hemoglobin 11.6 (L) 13.0 - 17.0 g/dL   HCT 35.6 (L) 39.0 - 52.0 %   MCV 87.7 80.0 - 100.0 fL   MCH 28.6 26.0 - 34.0 pg   MCHC 32.6 30.0 - 36.0 g/dL   RDW 13.6 11.5 - 15.5 %   Platelets 312 150 - 400 K/uL   nRBC 0.0 0.0 - 0.2 %   Neutrophils Relative % 61 %   Neutro Abs 3.8 1.7 - 7.7 K/uL   Lymphocytes Relative 25 %   Lymphs Abs 1.5 0.7 - 4.0 K/uL   Monocytes Relative 8 %   Monocytes Absolute 0.5 0.1 - 1.0 K/uL   Eosinophils Relative 5 %   Eosinophils Absolute 0.3 0.0 - 0.5 K/uL   Basophils Relative 1 %   Basophils Absolute 0.1 0.0 - 0.1 K/uL   Immature Granulocytes 0 %   Abs Immature Granulocytes 0.02 0.00 - 0.07 K/uL  Technologist smear review  Result Value Ref Range   WBC MORPHOLOGY Normal RBC, WBC, and platelet    RBC MORPHOLOGY Normal RBC, WBC, and platelet    Plt Morphology  Normal RBC, WBC, and platelet    Clinical Information anemia       Assessment & Plan:   Problem List Items Addressed This Visit       Digestive   Fever blister    Will start valtrex. Call with any concerns.       Relevant Medications   valACYclovir (VALTREX) 1000 MG tablet     Genitourinary   Benign hypertensive renal disease - Primary    Better. But still running high. Has been out of his torsemide today due to losing the bottle- called over to [facility] and they will get him enough to bridge to his pill pack. Will increase his losartan to '50mg'$  and recheck in about a week. Continue to monitor. Call with any concerns.         Follow up plan: Return 2:40 Thursday.

## 2022-03-20 NOTE — Assessment & Plan Note (Signed)
Will start valtrex. Call with any concerns.

## 2022-03-22 DIAGNOSIS — J449 Chronic obstructive pulmonary disease, unspecified: Secondary | ICD-10-CM

## 2022-03-22 DIAGNOSIS — I5030 Unspecified diastolic (congestive) heart failure: Secondary | ICD-10-CM

## 2022-03-22 DIAGNOSIS — E1159 Type 2 diabetes mellitus with other circulatory complications: Secondary | ICD-10-CM

## 2022-03-25 ENCOUNTER — Other Ambulatory Visit: Payer: HMO

## 2022-03-25 ENCOUNTER — Ambulatory Visit
Admission: RE | Admit: 2022-03-25 | Discharge: 2022-03-25 | Disposition: A | Discharge status: Home / Self Care | Payer: HMO | Source: Ambulatory Visit | Attending: Interventional Radiology | Admitting: Interventional Radiology

## 2022-03-25 ENCOUNTER — Other Ambulatory Visit: Payer: Self-pay | Admitting: Family Medicine

## 2022-03-25 ENCOUNTER — Other Ambulatory Visit (HOSPITAL_COMMUNITY): Payer: Self-pay | Admitting: Interventional Radiology

## 2022-03-25 ENCOUNTER — Telehealth (HOSPITAL_COMMUNITY): Payer: Self-pay | Admitting: Radiology

## 2022-03-25 DIAGNOSIS — N400 Enlarged prostate without lower urinary tract symptoms: Secondary | ICD-10-CM

## 2022-03-25 DIAGNOSIS — Z466 Encounter for fitting and adjustment of urinary device: Secondary | ICD-10-CM | POA: Diagnosis not present

## 2022-03-25 DIAGNOSIS — N401 Enlarged prostate with lower urinary tract symptoms: Secondary | ICD-10-CM

## 2022-03-25 DIAGNOSIS — E785 Hyperlipidemia, unspecified: Secondary | ICD-10-CM

## 2022-03-25 DIAGNOSIS — R319 Hematuria, unspecified: Secondary | ICD-10-CM | POA: Diagnosis not present

## 2022-03-25 HISTORY — PX: IR RADIOLOGIST EVAL & MGMT: IMG5224

## 2022-03-25 NOTE — Telephone Encounter (Signed)
Called pt to schedule PAE with Dr. Serafina Royals. Left VM that we do have an opening currently for 04/02/22 if he is interested in that to call me and let me know ASAP. JM

## 2022-03-25 NOTE — Progress Notes (Signed)
Chief Complaint: Patient was seen in virtual telephone consultation today for benign prostatic hyperplasia   Referring Physician(s): Justin Madrid, MD   History of Present Illness: Initial HPI (02/27/22): Justin Griffith is a 82 y.o. male presenting today via virtual telephone consultation at the gracious referral of Dr. Diamantina Griffith for management of benign prostatic hyperplasia.  His clinical course has been complicated by recurrent urinary tract infections and gross hematuria in the setting of chronic Foley catheter dependence.  His prostate is reported to measure approximately 175 grams.  Dr. Diamantina Griffith had originally planned for HOLEP procedure, however due to hematuria and PE (now on Eliquis) in June 2023, this was deferred.  His current indwelling 22 Fr Foley catheter was last exchanged on 02/02/22 in the emergency department.   He states he is motivated to have his catheter our, but does not want to undergo surgery.  We spoke about the overall periprocedural expectations and steps regarding PAE.     At our last visit, Justin Griffith was adamantly against traveling to [place] to have the procedure done, and wanted to wait until January to re-assess.  Today he states that he is ready to proceed and will have a friend who will be able to drive him to and from [place] from [place], but not until the first of February.  He is interested in moving forward with this plan.   No changes in symptoms since our last visit.  He remains catheter dependent.  Past Medical History:  Diagnosis Date   Acute urinary retention    Arthritis    Asthma 2000   Back pain    Colon polyp 2012   COPD (chronic obstructive pulmonary disease) (HCC)    Emphysema of lung (HCC)    Heart murmur    Hernia 2000   History of cold sores 05/06/2020   History of colon cancer    Hyperlipidemia    Hypertension    Personal history of malignant neoplasm of large intestine    Personal history of tobacco use, presenting hazards to  health    Septic shock ([place]) 11/18/2021   Sleep apnea    Special screening for malignant neoplasms, colon    Type 2 diabetes mellitus with proteinuria ([place]) 10/11/2014    Past Surgical History:  Procedure Laterality Date   CARDIAC CATHETERIZATION Left 09/25/2015   Procedure: Left Heart Cath and Coronary Angiography;  Surgeon: Justin Kida, MD;  Location: [place] [facility];  Service: Cardiovascular;  Laterality: Left;   CHOLECYSTECTOMY  1970   COLON SURGERY  07-16-99   sigmoid colon resection with primary anastomosis, chemotherapy for metastatic disease   COLONOSCOPY  2001, 2012   Dr Justin Griffith, tubular adenoma of the cecum and ascending colon in 2012.   COLONOSCOPY WITH PROPOFOL N/A 02/12/2016   Procedure: COLONOSCOPY WITH PROPOFOL;  Surgeon: Justin Bellow, MD;  [facility]: [facility] ENDOSCOPY;  Service: Endoscopy;  Laterality: N/A;   CORONARY STENT INTERVENTION N/A 05/17/2020   Procedure: CORONARY STENT INTERVENTION;  Surgeon: Justin Kida, MD;  Location: [place] [facility];  Service: Cardiovascular;  Laterality: N/A;  LAD   ESOPHAGOGASTRODUODENOSCOPY (EGD) WITH PROPOFOL N/A 02/12/2016   Procedure: ESOPHAGOGASTRODUODENOSCOPY (EGD) WITH PROPOFOL;  Surgeon: Justin Bellow, MD;  Location: [facility] ENDOSCOPY;  Service: Endoscopy;  Laterality: N/A;   HERNIA REPAIR Right    right inguinal hernia repair   IR RADIOLOGIST EVAL & MGMT  02/27/2022   JOINT REPLACEMENT Bilateral    knee replacement   KNEE SURGERY Bilateral 2010  LEFT HEART CATH AND CORONARY ANGIOGRAPHY N/A 05/17/2020   Procedure: LEFT HEART CATH AND CORONARY ANGIOGRAPHY possible PCI and stent;  Surgeon: Justin Kida, MD;  Location: [place] [facility];  Service: Cardiovascular;  Laterality: N/A;   MYRINGOTOMY WITH TUBE PLACEMENT Bilateral 08/16/2014   Procedure: MYRINGOTOMY WITH TUBE PLACEMENT;  Surgeon: Justin Manner, MD;  Location: [facility] ORS;  Service: ENT;  Laterality: Bilateral;   PERIPHERAL VASCULAR  CATHETERIZATION Right 09/04/2015   Procedure: Lower Extremity Angiography;  Surgeon: Justin Cabal, MD;  Location: [place] [facility];  Service: Cardiovascular;  Laterality: Right;   PERIPHERAL VASCULAR CATHETERIZATION  09/04/2015   Procedure: Lower Extremity Intervention;  Surgeon: Justin Cabal, MD;  Location: [place] [facility];  Service: Cardiovascular;;   TONSILLECTOMY     TYMPANOSTOMY TUBE PLACEMENT      Allergies: Farxiga [dapagliflozin], Dulaglutide, Liraglutide, and Jardiance [empagliflozin]  Medications: Prior to Admission medications   Medication Sig Start Date End Date Taking? Authorizing Provider  apixaban (ELIQUIS) 2.5 MG TABS tablet Take 1 tablet (2.5 mg total) by mouth 2 (two) times daily. For blood thinner 02/23/22   Griffith, Justin P, DO  ascorbic acid (VITAMIN C) 500 MG tablet Take 1 tablet (500 mg total) by mouth daily. To keep your immune system up 02/23/22   Park Liter P, DO  blood glucose meter kit and supplies 1 each by Other route daily. Dispense based on patient and insurance preference. Use up to four times daily as directed. (FOR ICD-10 E10.9, E11.9). 02/10/22   Griffith, Justin P, DO  cloNIDine (CATAPRES) 0.1 MG tablet Take 1 tablet (0.1 mg total) by mouth 2 (two) times daily. For blood pressure 02/23/22   Griffith, Justin P, DO  gabapentin (NEURONTIN) 300 MG capsule Take 1 capsule (300 mg total) by mouth 3 (three) times daily. For nerve pain and itching 03/13/22   Park Liter P, DO  hydrALAZINE (APRESOLINE) 50 MG tablet 1 tab 3 times a day for blood pressure 02/23/22   Griffith, Justin P, DO  losartan (COZAAR) 50 MG tablet Take 1 tablet (50 mg total) by mouth daily. For blood pressure 03/20/22   Griffith, Justin P, DO  Multiple Vitamin (MULTIVITAMIN WITH MINERALS) TABS tablet Take 1 tablet by mouth daily. For nutrition 02/23/22   Park Liter P, DO  nystatin (MYCOSTATIN/NYSTOP) powder Apply 1 Application topically 3 (three) times daily. 03/02/22   Griffith,  Justin P, DO  omeprazole (PRILOSEC) 40 MG capsule Take 1 capsule (40 mg total) by mouth daily. For heartburn 02/23/22   Griffith, Justin P, DO  polyethylene glycol powder (GLYCOLAX/MIRALAX) 17 GM/SCOOP powder Take 17 g by mouth 2 (two) times daily as needed. 03/02/22   Griffith, Justin P, DO  rosuvastatin (CRESTOR) 10 MG tablet Take 1 tablet (10 mg total) by mouth at bedtime. For cholesterol 02/23/22   Griffith, Justin P, DO  sodium hypochlorite (DAKIN'S 1/2 STRENGTH) external solution Apply 1 Application topically daily at 12 noon. Topically to wound daily to clean. 02/13/22   [provider]  Torsemide 40 MG TABS Take by mouth. 02/26/22 02/26/23  [provider]  umeclidinium bromide (INCRUSE ELLIPTA) 62.5 MCG/ACT AEPB Inhale 1 puff into the lungs daily. For your breathing 02/18/22   [name], Henrine Screws T, NP  valACYclovir (VALTREX) 1000 MG tablet Take 1 tablet (1,000 mg total) by mouth 2 (two) times daily. For fever blisters 03/20/22   Griffith, Justin P, DO  Zinc Oxide 10 % OINT Apply topically.    [provider]  Family History  Problem Relation Age of Onset   Diabetes Father    Esophageal cancer Mother    Alzheimer's disease Paternal Uncle     Social History   Socioeconomic History   Marital status: Married    Spouse name: Not on file   Number of children: Not on file   Years of education: Not on file   Highest education level: GED or equivalent  Occupational History   Occupation: retired    Comment: [employer]  Tobacco Use   Smoking status: Former    Packs/day: 1.00    Years: 30.00    Total pack years: 30.00    Types: Cigarettes    Quit date: 03/23/1989    Years since quitting: 33.0    Passive exposure: Past   Smokeless tobacco: Never  Vaping Use   Vaping Use: Never used  Substance and Sexual Activity   Alcohol use: No    Alcohol/week: 0.0 standard drinks of alcohol   Drug use: No   Sexual activity: Not Currently  Other Topics Concern   Not on file  Social  History Narrative   [employer]    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    Social Determinants of Health   Financial Resource Strain: Low Risk  (03/03/2022)   Overall Financial Resource Strain (CARDIA)    Difficulty of Paying Living Expenses: Not hard at all  Food Insecurity: No Food Insecurity (03/03/2022)   Hunger Vital Sign    Worried About Running Out of Food in the Last Year: Never true    [place] in the Last Year: Never true  Transportation Needs: No Transportation Needs (03/03/2022)   PRAPARE - Hydrologist (Medical): No    Lack of Transportation (Non-Medical): No  Physical Activity: Inactive (03/03/2022)   Exercise Vital Sign    Days of Exercise per Week: 0 days    Minutes of Exercise per Session: 0 min  Stress: No Stress Concern Present (03/03/2022)   [place]    Feeling of Stress : Only a little  Social Connections: Moderately Integrated (03/03/2022)   Social Connection and Isolation Panel [NHANES]    Frequency of Communication with Friends and Family: More than three times a week    Frequency of Social Gatherings with Friends and Family: More than three times a week    Attends Religious Services: More than 4 times per year    Active Member of [employer] or Organizations: No    Attends Archivist Meetings: Never    Marital Status: Married     Vital Signs: There were no vitals taken for this visit.  No physical examination was performed in lieu of virtual telephone clinic visit.   Imaging: CT AP 01/30/22   7.7 x 6.7 x 7.9 cm = 213 cc  Labs:  CBC: Recent Labs    12/10/21 1241 01/30/22 0045 02/04/22 1440 03/10/22 1551  WBC 5.5 8.3 7.0 6.2  HGB 10.9* 11.7* 12.4* 11.6*  HCT 33.3* 35.7* 37.7 35.6*  PLT 270 276 317 312    COAGS: Recent Labs    06/25/21 1837 09/19/21 1650 10/12/21 2302 11/17/21 2142 11/18/21 0329 11/18/21 1756 11/19/21 0147 11/20/21 0439   INR 1.1 1.3*  --  2.9* 2.6*  --   --   --   APTT  --  29   < > 44*  --  83* 68* 70*   < > = values in this  interval not displayed.    BMP: Recent Labs    12/06/21 0929 12/10/21 1241 01/31/22 0350 02/02/22 0049 02/04/22 1440 02/23/22 1611  NA 140 136 142 138 141 140  K 3.5 4.3 3.1* 3.1* 3.5 3.5  CL 108 106 108 106 101 104  CO2 _0 GLUCOSE 138* 134* 157* 209* 180* 149*  BUN _1 CALCIUM 9.9 9.8 9.5 9.8 10.2 10.0  CREATININE 1.16 1.08 1.06 1.07 1.21 1.04  GFRNONAA >60 >60 >60 >60  --   --     LIVER FUNCTION TESTS: Recent Labs    11/17/21 2142 12/05/21 0616 01/31/22 0350 02/04/22 1440  BILITOT 0.5 0.5 0.5 0.3  AST 26 23 14* 12  ALT _2 ALKPHOS 39 57 47 63  PROT 6.9 7.2 6.0* 6.6  ALBUMIN 3.0* 3.2* 2.8* 3.4*    Assessment and Plan: 82 year old male with history of benign prostatic hyperplasia with catheter dependence and hematuria. He would be an excellent candidate for prostate artery embolization. He is now amenable to proceeding with the procedure.  Plan [facility] artery [facility].  Electronically Signed: Suzette Battiest 03/25/2022, 12:54 PM   I spent a total of 25 Minutes in virtual telephone clinical consultation, greater than 50% of which was counseling/coordinating care for benign prostatic hyperplasia.

## 2022-03-26 ENCOUNTER — Ambulatory Visit (INDEPENDENT_AMBULATORY_CARE_PROVIDER_SITE_OTHER): Payer: PPO | Admitting: Family Medicine

## 2022-03-26 ENCOUNTER — Encounter: Payer: Self-pay | Admitting: Family Medicine

## 2022-03-26 VITALS — BP 198/84 | HR 73 | Temp 97.8°F | Ht 71.0 in | Wt 236.4 lb

## 2022-03-26 DIAGNOSIS — I129 Hypertensive chronic kidney disease with stage 1 through stage 4 chronic kidney disease, or unspecified chronic kidney disease: Secondary | ICD-10-CM | POA: Diagnosis not present

## 2022-03-26 MED ORDER — ACYCLOVIR 400 MG PO TABS: 400.0000 mg | ORAL_TABLET | Freq: Two times a day (BID) | ORAL | 1 refills | Status: DC

## 2022-03-26 MED ORDER — LOSARTAN POTASSIUM 100 MG PO TABS: 100.0000 mg | ORAL_TABLET | Freq: Every day | ORAL | 1 refills | Status: DC

## 2022-03-26 NOTE — Telephone Encounter (Signed)
Unable to refill per protocol, Rx request is too soon. Last refill 02/23/22 for 30 and 1 refill.   Requested Prescriptions  Pending Prescriptions Disp Refills   cloNIDine (CATAPRES) 0.1 MG tablet [Pharmacy Med Name: CLONIDINE HCL 0.1 MG TAB] 60 tablet 1    Sig: TAKE 1 TABLET BY MOUTH TWICE DAILY FOR BLOOD PRESSURE     Cardiovascular:  Alpha-2 Agonists Failed - 03/25/2022 10:44 AM      Failed - Last BP in normal range    BP Readings from Last 1 Encounters:  03/20/22 (!) 176/76         Passed - Last Heart Rate in normal range    Pulse Readings from Last 1 Encounters:  03/20/22 68         Passed - Valid encounter within last 6 months    Recent Outpatient Visits           6 days ago Benign hypertensive renal disease   [name] [place], Megan P, DO   1 week ago Benign hypertensive renal disease   [name] [place], Megan P, DO   3 weeks ago Benign hypertensive renal disease   [name] [place], Megan P, DO   1 month ago Type 2 diabetes mellitus with chronic kidney disease, without long-term current use of insulin, unspecified CKD stage ([place])   [place], Megan P, DO   1 month ago Pressure injury of sacral region, unstageable ([place])   [place], [place] T, NP       Future Appointments             Today [place], Megan P, DO [place]             ELIQUIS 2.5 MG TABS tablet [Pharmacy Med Name: ELIQUIS 2.5 MG TAB] 60 tablet 1    Sig: TAKE 1 TABLET BY MOUTH TWICE DAILY FOR BLOOD THINNER     Hematology:  Anticoagulants - apixaban Failed - 03/25/2022 10:44 AM      Failed - HGB in normal range and within 360 days    Hemoglobin  Date Value Ref Range Status  03/10/2022 11.6 (L) 13.0 - 17.0 g/dL Final  02/04/2022 12.4 (L) 13.0 - 17.7 g/dL Final         Failed - HCT in normal range and within 360 days    HCT  Date Value Ref Range Status  03/10/2022 35.6 (L) 39.0 - 52.0 %  Final   Hematocrit  Date Value Ref Range Status  02/04/2022 37.7 37.5 - 51.0 % Final         Passed - PLT in normal range and within 360 days    Platelets  Date Value Ref Range Status  03/10/2022 312 150 - 400 K/uL Final  02/04/2022 317 150 - 450 x10E3/uL Final         Passed - Cr in normal range and within 360 days    Creatinine, Ser  Date Value Ref Range Status  02/23/2022 1.04 0.76 - 1.27 mg/dL Final         Passed - AST in normal range and within 360 days    AST  Date Value Ref Range Status  02/04/2022 12 0 - 40 IU/L Final   AST (SGOT) Piccolo, Waived  Date Value Ref Range Status  02/02/2017 32 11 - 38 U/L Final         Passed - ALT in normal range and within 360 days  ALT  Date Value Ref Range Status  02/04/2022 9 0 - 44 IU/L Final   ALT (SGPT) Piccolo, Waived  Date Value Ref Range Status  02/02/2017 29 10 - 47 U/L Final         Passed - Valid encounter within last 12 months    Recent Outpatient Visits           6 days ago Benign hypertensive renal disease   [name] [place], Megan P, DO   1 week ago Benign hypertensive renal disease   [name] [place], Megan P, DO   3 weeks ago Benign hypertensive renal disease   [name] [place], Megan P, DO   1 month ago Type 2 diabetes mellitus with chronic kidney disease, without long-term current use of insulin, unspecified CKD stage ([place])   [place], Megan P, DO   1 month ago Pressure injury of sacral region, unstageable ([place])   [place], [place] T, NP       Future Appointments             Today [place], Megan P, DO [place], [facility]             rosuvastatin (CRESTOR) 10 MG tablet [Pharmacy Med Name: ROSUVASTATIN CALCIUM 10 MG TAB] 30 tablet 1    Sig: TAKE 1 TABLET BY MOUTH AT BEDTIME FOR CHOLESTEROL     Cardiovascular:  Antilipid - Statins 2 Failed - 03/25/2022 10:44 AM      Failed - Lipid  Panel in normal range within the last 12 months    Cholesterol, Total  Date Value Ref Range Status  02/04/2022 252 (H) 100 - 199 mg/dL Final   Cholesterol Piccolo, Waived  Date Value Ref Range Status  02/02/2017 89 <200 mg/dL Final    Comment:                            Desirable                <200                         Borderline High      200- 239                         High                     >239    LDL Chol Calc (NIH)  Date Value Ref Range Status  02/04/2022 169 (H) 0 - 99 mg/dL Final   HDL  Date Value Ref Range Status  02/04/2022 39 (L) >39 mg/dL Final   Triglycerides  Date Value Ref Range Status  02/04/2022 235 (H) 0 - 149 mg/dL Final   Triglycerides Piccolo,Waived  Date Value Ref Range Status  02/02/2017 262 (H) <150 mg/dL Final    Comment:                            Normal                   <150                         Borderline High     150 - 199  High                200 - 499                         Very High                >499          Passed - Cr in normal range and within 360 days    Creatinine, Ser  Date Value Ref Range Status  02/23/2022 1.04 0.76 - 1.27 mg/dL Final         Passed - Patient is not pregnant      Passed - Valid encounter within last 12 months    Recent Outpatient Visits           6 days ago Benign hypertensive renal disease   [name] [place], Megan P, DO   1 week ago Benign hypertensive renal disease   [name] [place], Megan P, DO   3 weeks ago Benign hypertensive renal disease   [name] [place], Megan P, DO   1 month ago Type 2 diabetes mellitus with chronic kidney disease, without long-term current use of insulin, unspecified CKD stage ([place])   [place], Megan P, DO   1 month ago Pressure injury of sacral region, unstageable [facility])   [place], [name], NP       Future Appointments              Today Valerie Roys, DO [place]

## 2022-03-26 NOTE — Progress Notes (Signed)
BP (!) 198/84   Pulse 73   Temp 97.8 F (36.6 C) (Oral)   Ht '5\' 11"'$  (1.803 m)   Wt 236 lb 6.4 oz (107.2 kg)   SpO2 98%   BMI 32.97 kg/m    Subjective:    Patient ID: Justin Griffith, male    DOB: 1940/08/20, 82 y.o.   MRN: [id number]  HPI: KEEFE ZAWISTOWSKI is a 82 y.o. male  Chief Complaint  Patient presents with   Hypertension   Medication Refill   HYPERTENSION- used his last pill this AM  Hypertension status: uncontrolled  Satisfied with current treatment? no Duration of hypertension: chronic BP monitoring frequency:  not checking BP range:  BP medication side effects:  no Medication compliance: fair compliance Previous BP meds: losartan, tosemide, hydralazine, clonidine Aspirin: no Recurrent headaches: no Visual changes: no Palpitations: no Dyspnea: no Chest pain: no Lower extremity edema: no Dizzy/lightheaded: no  Relevant past medical, surgical, family and social history reviewed and updated as indicated. Interim medical history since our last visit reviewed. Allergies and medications reviewed and updated.  Review of Systems  Constitutional: Negative.   Respiratory: Negative.    Cardiovascular: Negative.   Gastrointestinal: Negative.   Musculoskeletal: Negative.   Neurological: Negative.   Psychiatric/Behavioral: Negative.      Per HPI unless specifically indicated above     Objective:    BP (!) 198/84   Pulse 73   Temp 97.8 F (36.6 C) (Oral)   Ht '5\' 11"'$  (1.803 m)   Wt 236 lb 6.4 oz (107.2 kg)   SpO2 98%   BMI 32.97 kg/m   Wt Readings from Last 3 Encounters:  03/26/22 236 lb 6.4 oz (107.2 kg)  03/20/22 236 lb 8 oz (107.3 kg)  03/13/22 240 lb 8 oz (109.1 kg)    Physical Exam Vitals and nursing note reviewed.  Constitutional:      General: He is not in acute distress.    Appearance: Normal appearance. He is obese. He is not ill-appearing, toxic-appearing or diaphoretic.  HENT:     Head: Normocephalic and atraumatic.     Right Ear:  External ear normal.     Left Ear: External ear normal.     Nose: Nose normal.     Mouth/Throat:     Mouth: Mucous membranes are moist.     Pharynx: Oropharynx is clear.  Eyes:     General: No scleral icterus.       Right eye: No discharge.        Left eye: No discharge.     Extraocular Movements: Extraocular movements intact.     Conjunctiva/sclera: Conjunctivae normal.     Pupils: Pupils are equal, round, and reactive to light.  Cardiovascular:     Rate and Rhythm: Normal rate and regular rhythm.     Pulses: Normal pulses.     Heart sounds: Normal heart sounds. No murmur heard.    No friction rub. No gallop.  Pulmonary:     Effort: Pulmonary effort is normal. No respiratory distress.     Breath sounds: Normal breath sounds. No stridor. No wheezing, rhonchi or rales.  Chest:     Chest wall: No tenderness.  Musculoskeletal:        General: Normal range of motion.     Cervical back: Normal range of motion and neck supple.  Skin:    General: Skin is warm and dry.     Capillary Refill: Capillary refill takes less than 2 seconds.  Coloration: Skin is not jaundiced or pale.     Findings: No bruising, erythema, lesion or rash.  Neurological:     General: No focal deficit present.     Mental Status: He is alert and oriented to person, place, and time. Mental status is at baseline.  Psychiatric:        Mood and Affect: Mood normal.        Behavior: Behavior normal.        Thought Content: Thought content normal.        Judgment: Judgment normal.     Results for orders placed or performed in visit on 03/10/22  Iron and TIBC  Result Value Ref Range   Iron 46 45 - 182 ug/dL   TIBC 363 250 - 450 ug/dL   Saturation Ratios 13 (L) 17.9 - 39.5 %   UIBC 317 ug/dL  Ferritin  Result Value Ref Range   Ferritin 23 (L) 24 - 336 ng/mL  Folate  Result Value Ref Range   Folate 17.1 >5.9 ng/mL  Prothrombin gene mutation  Result Value Ref Range   Recommendations-PTGENE: Comment     Reviewed by: Comment   Protein S, total and free  Result Value Ref Range   Protein S Ag, Total 119 60 - 150 %   Protein S Ag, Free 94 61 - 136 %  Protein C activity  Result Value Ref Range   Protein C Activity 100 73 - 180 %  Factor 5 leiden  Result Value Ref Range   Recommendations-F5LEID: Comment    Reviewed By: Comment   ANTIPHOSPHOLIPID SYNDROME PROF  Result Value Ref Range   Anticardiolipin IgG 14 0 - 14 GPL U/mL   Anticardiolipin IgM <9 0 - 12 MPL U/mL   PTT Lupus Anticoagulant 29.6 0.0 - 43.5 sec   DRVVT 31.5 0.0 - 47.0 sec   Lupus Anticoag Interp Comment:   CBC with Differential/Platelet  Result Value Ref Range   WBC 6.2 4.0 - 10.5 K/uL   RBC 4.06 (L) 4.22 - 5.81 MIL/uL   Hemoglobin 11.6 (L) 13.0 - 17.0 g/dL   HCT 35.6 (L) 39.0 - 52.0 %   MCV 87.7 80.0 - 100.0 fL   MCH 28.6 26.0 - 34.0 pg   MCHC 32.6 30.0 - 36.0 g/dL   RDW 13.6 11.5 - 15.5 %   Platelets 312 150 - 400 K/uL   nRBC 0.0 0.0 - 0.2 %   Neutrophils Relative % 61 %   Neutro Abs 3.8 1.7 - 7.7 K/uL   Lymphocytes Relative 25 %   Lymphs Abs 1.5 0.7 - 4.0 K/uL   Monocytes Relative 8 %   Monocytes Absolute 0.5 0.1 - 1.0 K/uL   Eosinophils Relative 5 %   Eosinophils Absolute 0.3 0.0 - 0.5 K/uL   Basophils Relative 1 %   Basophils Absolute 0.1 0.0 - 0.1 K/uL   Immature Granulocytes 0 %   Abs Immature Granulocytes 0.02 0.00 - 0.07 K/uL  Technologist smear review  Result Value Ref Range   WBC MORPHOLOGY Normal RBC, WBC, and platelet    RBC MORPHOLOGY Normal RBC, WBC, and platelet    Plt Morphology Normal RBC, WBC, and platelet    Clinical Information anemia       Assessment & Plan:   Problem List Items Addressed This Visit       Genitourinary   Benign hypertensive renal disease - Primary    Still not under good control. Will increase his losartan to '100mg'$   and recheck 1 week. Call with any concerns.         Follow up plan: Return in about 1 week (around 04/02/2022) for ok to double book if  needed.

## 2022-03-26 NOTE — Assessment & Plan Note (Signed)
Still not under good control. Will increase his losartan to '100mg'$  and recheck 1 week. Call with any concerns.

## 2022-03-29 DIAGNOSIS — J449 Chronic obstructive pulmonary disease, unspecified: Secondary | ICD-10-CM | POA: Diagnosis not present

## 2022-03-29 DIAGNOSIS — I69351 Hemiplegia and hemiparesis following cerebral infarction affecting right dominant side: Secondary | ICD-10-CM | POA: Diagnosis not present

## 2022-03-29 DIAGNOSIS — I509 Heart failure, unspecified: Secondary | ICD-10-CM | POA: Diagnosis not present

## 2022-03-31 ENCOUNTER — Telehealth: Payer: Self-pay | Admitting: Family Medicine

## 2022-03-31 NOTE — Telephone Encounter (Signed)
Home Health Verbal Orders - Caller/Agency: Emily Gaines Number: [phone]  Requesting OT/PT/Skilled Nursing/Social Work/Speech Therapy: PT  She ask to move his pt visit from this week to next week due to blood pressure

## 2022-03-31 NOTE — Telephone Encounter (Signed)
OK for verbal orders?

## 2022-04-01 ENCOUNTER — Ambulatory Visit (INDEPENDENT_AMBULATORY_CARE_PROVIDER_SITE_OTHER): Payer: PPO | Admitting: Urology

## 2022-04-01 ENCOUNTER — Other Ambulatory Visit: Payer: PPO | Admitting: Pharmacist

## 2022-04-01 ENCOUNTER — Emergency Department
Admission: EM | Admit: 2022-04-01 | Discharge: 2022-04-01 | Disposition: A | Discharge status: Home / Self Care | Payer: PPO | Attending: Emergency Medicine | Admitting: Emergency Medicine

## 2022-04-01 ENCOUNTER — Telehealth: Payer: Self-pay | Admitting: Pharmacist

## 2022-04-01 ENCOUNTER — Encounter: Payer: Self-pay | Admitting: Pharmacist

## 2022-04-01 DIAGNOSIS — N138 Other obstructive and reflux uropathy: Secondary | ICD-10-CM | POA: Diagnosis not present

## 2022-04-01 DIAGNOSIS — T83091A Other mechanical complication of indwelling urethral catheter, initial encounter: Secondary | ICD-10-CM | POA: Diagnosis not present

## 2022-04-01 DIAGNOSIS — R339 Retention of urine, unspecified: Secondary | ICD-10-CM | POA: Insufficient documentation

## 2022-04-01 DIAGNOSIS — T839XXA Unspecified complication of genitourinary prosthetic device, implant and graft, initial encounter: Secondary | ICD-10-CM

## 2022-04-01 DIAGNOSIS — Y69 Unspecified misadventure during surgical and medical care: Secondary | ICD-10-CM | POA: Insufficient documentation

## 2022-04-01 DIAGNOSIS — T83098A Other mechanical complication of other indwelling urethral catheter, initial encounter: Secondary | ICD-10-CM | POA: Insufficient documentation

## 2022-04-01 DIAGNOSIS — R310 Gross hematuria: Secondary | ICD-10-CM

## 2022-04-01 DIAGNOSIS — N401 Enlarged prostate with lower urinary tract symptoms: Secondary | ICD-10-CM | POA: Diagnosis not present

## 2022-04-01 LAB — URINALYSIS, ROUTINE W REFLEX MICROSCOPIC
RBC / HPF: >50 RBC/hpf — ABNORMAL HIGH (ref 0–5)
Specific Gravity, Urine: 1.01 (ref 1.005–1.030)
Squamous Epithelial / HPF: NONE SEEN /HPF (ref 0–5)

## 2022-04-01 LAB — CBC WITH DIFFERENTIAL/PLATELET
Abs Immature Granulocytes: 0.06 10*3/uL (ref 0.00–0.07)
Basophils Absolute: 0.1 10*3/uL (ref 0.0–0.1)
Basophils Relative: 1 %
Eosinophils Absolute: 0.3 10*3/uL (ref 0.0–0.5)
Eosinophils Relative: 4 %
HCT: 36 % — ABNORMAL LOW (ref 39.0–52.0)
Hemoglobin: 11.6 g/dL — ABNORMAL LOW (ref 13.0–17.0)
Immature Granulocytes: 1 %
Lymphocytes Relative: 19 %
Lymphs Abs: 1.3 10*3/uL (ref 0.7–4.0)
MCH: 28.2 pg (ref 26.0–34.0)
MCHC: 32.2 g/dL (ref 30.0–36.0)
MCV: 87.6 fL (ref 80.0–100.0)
Monocytes Absolute: 0.6 10*3/uL (ref 0.1–1.0)
Monocytes Relative: 9 %
Neutro Abs: 4.4 10*3/uL (ref 1.7–7.7)
Neutrophils Relative %: 66 %
Platelets: 297 10*3/uL (ref 150–400)
RBC: 4.11 MIL/uL — ABNORMAL LOW (ref 4.22–5.81)
RDW: 13.5 % (ref 11.5–15.5)
WBC: 6.7 10*3/uL (ref 4.0–10.5)
nRBC: 0 % (ref 0.0–0.2)

## 2022-04-01 LAB — COMPREHENSIVE METABOLIC PANEL
ALT: 10 U/L (ref 0–44)
AST: 16 U/L (ref 15–41)
Albumin: 3.4 g/dL — ABNORMAL LOW (ref 3.5–5.0)
Alkaline Phosphatase: 57 U/L (ref 38–126)
Anion gap: 10 (ref 5–15)
BUN: 27 mg/dL — ABNORMAL HIGH (ref 8–23)
CO2: 27 mmol/L (ref 22–32)
Calcium: 9.4 mg/dL (ref 8.9–10.3)
Chloride: 101 mmol/L (ref 98–111)
Creatinine, Ser: 1.55 mg/dL — ABNORMAL HIGH (ref 0.61–1.24)
GFR, Estimated: 44 mL/min — ABNORMAL LOW (ref >60)
Glucose, Bld: 279 mg/dL — ABNORMAL HIGH (ref 70–99)
Potassium: 3.8 mmol/L (ref 3.5–5.1)
Sodium: 138 mmol/L (ref 135–145)
Total Bilirubin: 0.5 mg/dL (ref 0.3–1.2)
Total Protein: 7.4 g/dL (ref 6.5–8.1)

## 2022-04-01 MED ORDER — CEPHALEXIN 500 MG PO CAPS: 1000.0000 mg | ORAL_CAPSULE | Freq: Two times a day (BID) | ORAL | 0 refills | Status: DC

## 2022-04-01 NOTE — ED Triage Notes (Signed)
Pt presents via POV with complaints of urinary retention - pt had a foley changed today at the Urology office.  Pt had some hematuria in office and some in the foley bag at this time. States he hasn't had a steady flow in several hours. Pt has a chronic foley due to an enlarged prostate which he's supposed to have surgery on in the next month. Denies CP or SOB.

## 2022-04-01 NOTE — Telephone Encounter (Signed)
Left message for Raquel Sarna providing verbal orders for OK for patient. Advised to give our office a call back if she has any questions.

## 2022-04-01 NOTE — Progress Notes (Signed)
   04/01/2022 1:11 PM   [place]RE [place] 01-Jul-1940 [id number]  Reason for visit: Follow up BPH, urinary retention, history of gross hematuria and recurrent UTI  HPI: Patient added to my schedule today for gross hematuria/clot retention with nondraining Foley catheter.  He has complex history and well-known to urology, see full history below.  ---------------------------------  Extremely comorbid 82 year old male with BPH with recurrent gross hematuria, recurrent UTIs, and urinary retention currently managed with chronic Foley catheter.  Prostate measures ~200 g on CT.  He had originally opted for HOLEP to address his urinary retention and recurrent gross hematuria, however had a pulmonary embolus 1 week prior to scheduled surgery in June 2023 that required anticoagulation with Eliquis.  He was seen by our PA in July 2023 and was supposed to be scheduled with interventional radiology to consider PAE for both his urinary retention and recurrent gross hematuria, but he never followed up with them.  On chart review, he has had numerous ER visits over the last 4 months with gross hematuria or non-draining Foley(often pulled down into the prostatic fossa on imaging).  His Foley was most recently exchanged in the ER on 02/02/2022(61F 3-way), and he reports he has been doing very well with this catheter in place and urine has remained essentially clear.  I had recommended multiple times to exchange the Foley, with risk for sediment and infection, but he has adamantly refused multiple times as that catheter had been functioning well for him.  We had discussed HOLEP versus prostatic artery embolization at length, and most recently he had met with interventional radiology on 03/25/2022 and was planning to schedule PAE in early February.  ------------------------------------  On exam the indwelling 22 [place] three-way had thick maroon blood in the tubing.  30 mL were removed from the balloon and the old  catheter removed.  A new 22 French coud 2-way Foley was advanced easily into the bladder with return of red urine.  60 mL of old clot were quickly irrigated free.  The bladder was then irrigated with a liter of saline, and urine cleared quickly.  The catheter irrigated easily and remained very faint pink with no clots.  Catheter was connected to drainage and secured to the thigh.  I recommended he continue to drink plenty of fluids and hold the Eliquis for 2 to 3 days.  Return precautions were discussed.  I will also reach out to interventional radiology Dr. Serafina Royals, patient is amenable to proceeding with PAE in early February when he has a ride available to [place].  Overall, very challenging patient who has been non-compliant with our recommendations and follow-up, which has led to numerous ER visits for gross hematuria/clot retention/catheter problems.  Maintain Foley to drainage, return precautions discussed Plan for PAE with interventional radiology in early February, then follow-up with me 4 weeks later for voiding trial  I spent 50 total minutes on the day of the encounter including pre-visit review of the medical record, face-to-face time with the patient, and post visit ordering of labs/imaging/tests.   Billey Co, [facility] [address] [place], [place] [address] [phone]

## 2022-04-01 NOTE — Telephone Encounter (Signed)
This encounter was created in error - please disregard.

## 2022-04-01 NOTE — ED Notes (Signed)
This RN & EDT (Ariel) attempted to bladder scan the patient with inconclusive results. Pts foley was irrigated with a 25m flush and he had an output >7078m Specimen sent to the lab - dark in color (Pt states he's on Eliquis). Pt endorses instant relief following the irrigation.

## 2022-04-01 NOTE — Telephone Encounter (Signed)
   Outreach Note  04/01/2022 Name: Justin Griffith MRN: [id number] DOB: 02-17-1941  Referred by: Valerie Roys, DO Reason for referral : No chief complaint on file.   Was unable to reach patient via telephone today and have left HIPAA compliant voicemail asking patient to return my call.    Follow Up Plan: Will attempt to reach patient by telephone again within the next 30 days.  Wallace Cullens, PharmD, [place] [phone]

## 2022-04-01 NOTE — ED Provider Notes (Signed)
[facility] Provider Note  Patient Contact: 10:23 PM (approximate)   History   Urinary Retention   HPI  Justin Griffith is a 82 y.o. male who presents the emergency department complaining of problems with his Foley catheter.  Patient states that he had to have his Foley catheter changed today by his urologist.  Patient developed what he believes is a blood clot in the catheter not allowing him to urinate.  He had urinary pressure and the need to urinate.  Patient was given a Foley catheter flush in triage which immediately improved his symptoms and he had a large amount of urinary output.  Patient states that he feels much better there is concern that the last time this happened he may have a UTI.  Patient is requesting testing for same.     Physical Exam   Triage Vital Signs: ED Triage Vitals  Enc Vitals Group     BP 04/01/22 1917 (!) 219/116     Pulse Rate 04/01/22 1917 (!) 102     Resp 04/01/22 1917 18     Temp 04/01/22 1917 98 F (36.7 C)     Temp Source 04/01/22 1917 Oral     SpO2 04/01/22 1917 96 %     Weight 04/01/22 1918 237 lb 3.4 oz (107.6 kg)     Height 04/01/22 1918 '5\' 11"'$  (1.803 m)     Head Circumference --      Peak Flow --      Pain Score 04/01/22 1918 7     Pain Loc --      Pain Edu? --      Excl. in [place]? --     Most recent vital signs: Vitals:   04/01/22 1917  BP: (!) 219/116  Pulse: (!) 102  Resp: 18  Temp: 98 F (36.7 C)  SpO2: 96%     General: Alert and in no acute distress.  Cardiovascular:  Good peripheral perfusion Respiratory: Normal respiratory effort without tachypnea or retractions. Lungs CTAB.  Gastrointestinal: Bowel sounds 4 quadrants. Soft and nontender to palpation. No guarding or rigidity. No palpable masses. No distention. No CVA tenderness.  Bedside ultrasound was performed.  Foley catheter is visualized in the patient's bladder.  Bladder is mostly decompressed.  Do not suspect urinary retention at this  time after urinary flush of his catheter. Musculoskeletal: Full range of motion to all extremities.  Neurologic:  No gross focal neurologic deficits are appreciated.  Skin:   No rash noted Other:   ED Results / Procedures / Treatments   Labs (all labs ordered are listed, but only abnormal results are displayed) Labs Reviewed  URINALYSIS, ROUTINE W REFLEX MICROSCOPIC - Abnormal; Notable for the following components:      Result Value   Color, Urine RED (*)    APPearance TURBID (*)    Glucose, UA   (*)    Value: TEST NOT REPORTED DUE TO COLOR INTERFERENCE OF URINE PIGMENT   Hgb urine dipstick   (*)    Value: TEST NOT REPORTED DUE TO COLOR INTERFERENCE OF URINE PIGMENT   Bilirubin Urine   (*)    Value: TEST NOT REPORTED DUE TO COLOR INTERFERENCE OF URINE PIGMENT   Ketones, ur   (*)    Value: TEST NOT REPORTED DUE TO COLOR INTERFERENCE OF URINE PIGMENT   Protein, ur   (*)    Value: TEST NOT REPORTED DUE TO COLOR INTERFERENCE OF URINE PIGMENT   Nitrite   (*)  Value: TEST NOT REPORTED DUE TO COLOR INTERFERENCE OF URINE PIGMENT   Leukocytes,Ua   (*)    Value: TEST NOT REPORTED DUE TO COLOR INTERFERENCE OF URINE PIGMENT   RBC / HPF >50 (*)    Bacteria, UA MANY (*)    All other components within normal limits  CBC WITH DIFFERENTIAL/PLATELET - Abnormal; Notable for the following components:   RBC 4.11 (*)    Hemoglobin 11.6 (*)    HCT 36.0 (*)    All other components within normal limits  COMPREHENSIVE METABOLIC PANEL - Abnormal; Notable for the following components:   Glucose, Bld 279 (*)    BUN 27 (*)    Creatinine, Ser 1.55 (*)    Albumin 3.4 (*)    GFR, Estimated 44 (*)    All other components within normal limits  URINE CULTURE     EKG     RADIOLOGY    No results found.  PROCEDURES:  Critical Care performed: No  Ultrasound ED Renal  Date/Time: 04/01/2022 10:32 PM  Performed by: Darletta Moll, PA-C Authorized by: Darletta Moll, PA-C    Procedure details:    Indications: urinary retention     Technique:  BladderImages: not archived Bladder findings:    Bladder:  Visualized   Free pelvic fluid: not identified   Comments:     Patient with Foley catheter visualized in the bladder.  Bladder is largely decompressed.  Less than 50 mL of urine    MEDICATIONS ORDERED IN ED: Medications - No data to display   IMPRESSION / MDM / [place] / ED COURSE  I reviewed the triage vital signs and the nursing notes.                                 Differential diagnosis includes, but is not limited to, urinary retention, urinary tract infection, Foley catheter problem  Patient's presentation is most consistent with acute presentation with potential threat to life or bodily function.   Patient's diagnosis is consistent with Foley catheter problem, urinary retention.  Patient presents emergency department with urinary retention after having his Foley catheter replaced earlier today.  Appears that patient had a blood clot in the Foley catheter that was obstructing flow.  This was flushed with good improvement of symptoms as well as good urinary output.  Bedside ultrasound performed with Foley catheter in place in the bladder as well as bladder being largely decompressed.  At this time patient did have some bacteria though the urinalysis was otherwise largely blood.  As such I will place the patient on antibiotics and perform urine culture to ensure whether this truly was a UTI or not.  Follow-up with his urologist.  Return precautions discussed with patient..  Patient is given ED precautions to return to the ED for any worsening or new symptoms.     FINAL CLINICAL IMPRESSION(S) / ED DIAGNOSES   Final diagnoses:  Urinary retention  Problem with Foley catheter, initial encounter ([place])     Rx / DC Orders   ED Discharge Orders          Ordered    cephALEXin (KEFLEX) 500 MG capsule  2 times daily        04/01/22 2230              Note:  This document was prepared using Dragon voice recognition software and may include unintentional dictation errors.  Brynda Peon 04/01/22 2234    Lucillie Garfinkel, MD 04/02/22 2330

## 2022-04-01 NOTE — ED Notes (Signed)
Per pt request, RN changed out pt drainage bag.

## 2022-04-02 ENCOUNTER — Other Ambulatory Visit: Payer: Self-pay

## 2022-04-02 ENCOUNTER — Encounter: Payer: Self-pay | Admitting: Emergency Medicine

## 2022-04-02 ENCOUNTER — Emergency Department
Admission: EM | Admit: 2022-04-02 | Discharge: 2022-04-02 | Disposition: A | Discharge status: Home / Self Care | Payer: PPO | Source: Home / Self Care | Attending: Emergency Medicine | Admitting: Emergency Medicine

## 2022-04-02 ENCOUNTER — Emergency Department
Admission: EM | Admit: 2022-04-02 | Discharge: 2022-04-02 | Disposition: A | Discharge status: Home / Self Care | Payer: PPO | Attending: Emergency Medicine | Admitting: Emergency Medicine

## 2022-04-02 DIAGNOSIS — I130 Hypertensive heart and chronic kidney disease with heart failure and stage 1 through stage 4 chronic kidney disease, or unspecified chronic kidney disease: Secondary | ICD-10-CM | POA: Insufficient documentation

## 2022-04-02 DIAGNOSIS — I509 Heart failure, unspecified: Secondary | ICD-10-CM | POA: Insufficient documentation

## 2022-04-02 DIAGNOSIS — Y731 Therapeutic (nonsurgical) and rehabilitative gastroenterology and urology devices associated with adverse incidents: Secondary | ICD-10-CM | POA: Diagnosis not present

## 2022-04-02 DIAGNOSIS — T83091A Other mechanical complication of indwelling urethral catheter, initial encounter: Secondary | ICD-10-CM | POA: Diagnosis not present

## 2022-04-02 DIAGNOSIS — Z7901 Long term (current) use of anticoagulants: Secondary | ICD-10-CM | POA: Insufficient documentation

## 2022-04-02 DIAGNOSIS — E1122 Type 2 diabetes mellitus with diabetic chronic kidney disease: Secondary | ICD-10-CM | POA: Insufficient documentation

## 2022-04-02 DIAGNOSIS — I2510 Atherosclerotic heart disease of native coronary artery without angina pectoris: Secondary | ICD-10-CM | POA: Insufficient documentation

## 2022-04-02 DIAGNOSIS — R339 Retention of urine, unspecified: Secondary | ICD-10-CM | POA: Diagnosis not present

## 2022-04-02 DIAGNOSIS — Z978 Presence of other specified devices: Secondary | ICD-10-CM

## 2022-04-02 DIAGNOSIS — N189 Chronic kidney disease, unspecified: Secondary | ICD-10-CM | POA: Insufficient documentation

## 2022-04-02 DIAGNOSIS — R310 Gross hematuria: Secondary | ICD-10-CM | POA: Insufficient documentation

## 2022-04-02 DIAGNOSIS — T83098A Other mechanical complication of other indwelling urethral catheter, initial encounter: Secondary | ICD-10-CM | POA: Insufficient documentation

## 2022-04-02 DIAGNOSIS — J449 Chronic obstructive pulmonary disease, unspecified: Secondary | ICD-10-CM | POA: Diagnosis not present

## 2022-04-02 LAB — CBC WITH DIFFERENTIAL/PLATELET
Abs Immature Granulocytes: 0.05 10*3/uL (ref 0.00–0.07)
Basophils Absolute: 0.1 10*3/uL (ref 0.0–0.1)
Basophils Relative: 1 %
Eosinophils Absolute: 0.3 10*3/uL (ref 0.0–0.5)
Eosinophils Relative: 4 %
HCT: 35.3 % — ABNORMAL LOW (ref 39.0–52.0)
Hemoglobin: 11.4 g/dL — ABNORMAL LOW (ref 13.0–17.0)
Immature Granulocytes: 1 %
Lymphocytes Relative: 23 %
Lymphs Abs: 1.7 10*3/uL (ref 0.7–4.0)
MCH: 28.4 pg (ref 26.0–34.0)
MCHC: 32.3 g/dL (ref 30.0–36.0)
MCV: 87.8 fL (ref 80.0–100.0)
Monocytes Absolute: 0.7 10*3/uL (ref 0.1–1.0)
Monocytes Relative: 9 %
Neutro Abs: 4.7 10*3/uL (ref 1.7–7.7)
Neutrophils Relative %: 62 %
Platelets: 273 10*3/uL (ref 150–400)
RBC: 4.02 MIL/uL — ABNORMAL LOW (ref 4.22–5.81)
RDW: 13.4 % (ref 11.5–15.5)
WBC: 7.6 10*3/uL (ref 4.0–10.5)
nRBC: 0 % (ref 0.0–0.2)

## 2022-04-02 LAB — COMPREHENSIVE METABOLIC PANEL
ALT: 10 U/L (ref 0–44)
AST: 15 U/L (ref 15–41)
Albumin: 3.4 g/dL — ABNORMAL LOW (ref 3.5–5.0)
Alkaline Phosphatase: 57 U/L (ref 38–126)
Anion gap: 12 (ref 5–15)
BUN: 27 mg/dL — ABNORMAL HIGH (ref 8–23)
CO2: 26 mmol/L (ref 22–32)
Calcium: 9.7 mg/dL (ref 8.9–10.3)
Chloride: 99 mmol/L (ref 98–111)
Creatinine, Ser: 1.3 mg/dL — ABNORMAL HIGH (ref 0.61–1.24)
GFR, Estimated: 55 mL/min — ABNORMAL LOW (ref >60)
Glucose, Bld: 243 mg/dL — ABNORMAL HIGH (ref 70–99)
Potassium: 3.6 mmol/L (ref 3.5–5.1)
Sodium: 137 mmol/L (ref 135–145)
Total Bilirubin: 0.5 mg/dL (ref 0.3–1.2)
Total Protein: 7.5 g/dL (ref 6.5–8.1)

## 2022-04-02 NOTE — ED Provider Notes (Signed)
   [facility] Provider Note    Event Date/Time   First MD Initiated Contact with Patient 04/02/22 2311     (approximate)   History   No chief complaint on file.   HPI  Justin Griffith is a 82 y.o. male who presents to the ED for evaluation of No chief complaint on file.   I reviewed 3 ED visits for similar complaints in the past couple days.  History of gross hematuria requiring three-way Foley.  He presents to the ED because there is a blood clot in his collection bag from his Foley.  Reports the Foley is draining fine and does not draining bloody urine, no abdominal pain, fevers.  Reports he has an appointment tomorrow morning with a urologist, but does not want there to be a blood clot within his collection bag so he is requesting a new bag.   Physical Exam   Triage Vital Signs: ED Triage Vitals [04/02/22 2303]  Enc Vitals Group     BP      Pulse Rate 90     Resp 20     Temp 98.7 F (37.1 C)     Temp Source Oral     SpO2 98 %     Weight      Height      Head Circumference      Peak Flow      Pain Score 0     Pain Loc      Pain Edu?      Excl. in [place]?     Most recent vital signs: Vitals:   04/02/22 2303 04/02/22 2305  BP:  (!) 179/102  Pulse: 90 91  Resp: 20   Temp: 98.7 F (37.1 C) 98.7 F (37.1 C)  SpO2: 98% 99%    General: Awake, no distress.  CV:  Good peripheral perfusion.  Resp:  Normal effort.  Abd:  No distention.  MSK:  No deformity noted.  Neuro:  No focal deficits appreciated. Other:     ED Results / Procedures / Treatments   Labs (all labs ordered are listed, but only abnormal results are displayed) Labs Reviewed - No data to display  EKG   RADIOLOGY   Official radiology report(s): No results found.  PROCEDURES and INTERVENTIONS:  Procedures  Medications - No data to display   IMPRESSION / MDM / [place] / ED COURSE  I reviewed the triage vital signs and the nursing notes.  We  provide this patient with another urinary bag for his Foley.  We discussed appropriate use of the ED.   FINAL CLINICAL IMPRESSION(S) / ED DIAGNOSES   Final diagnoses:  Foley catheter present     Rx / DC Orders   ED Discharge Orders     None        Note:  This document was prepared using Dragon voice recognition software and may include unintentional dictation errors.   Vladimir Crofts, MD 04/03/22 [phone]

## 2022-04-02 NOTE — Discharge Instructions (Addendum)
Go see you Urologist tomorrow

## 2022-04-02 NOTE — ED Notes (Signed)
Patient bladder flushed and irrigated with 1000 ml of normal saline.  Foley is draining on it's own, a few clots were noted during irrigation.  Provider notified.

## 2022-04-02 NOTE — ED Notes (Signed)
Catheter bag changed

## 2022-04-02 NOTE — ED Triage Notes (Signed)
Pt presents via POV with complaints of urinary retention. Pt was seen here last night and had his foley irrigated. Pt endorsing pain in his bladder; foley irrigated in triage ~568m drained from his bladder and is still actively draining. Denies dizziness, CP or SOB.

## 2022-04-02 NOTE — ED Triage Notes (Signed)
Pt comes from home via POV wanting his catheter bag changed. Pt states he does not want to see a doctor or wants anything done, just wants the bag changed. NAD at this time, denies any complaints.

## 2022-04-02 NOTE — ED Provider Notes (Signed)
[facility] Provider Note    Event Date/Time   First MD Initiated Contact with Patient 04/02/22 0703     (approximate)   History   Chief Complaint Urinary Retention  HPI  Justin Griffith is a 82 y.o. male with past medical history of hypertension, diabetes, CAD, CHF, PAD, PE on Eliquis, CKD, COPD, and urinary retention with chronic Foley who presents to the ED complaining of urinary retention.  Patient reports that he had a new Foley catheter placed yesterday by his urologist, came to the ED yesterday evening because he was experiencing pain and was concerned that his catheter was not draining.  Catheter was flushed at that time with passage of a small clot and improvement in flow, patient states pain was improved.  He then woke up this morning with return of pain in his suprapubic area, reports concern that there is still blood in his urine.  He does confirm that he has been holding Eliquis as recommended by urology.  He denies any fevers, flank pain, or dysuria.  Catheter was irrigated in triage and patient reports significant relief afterwards.     Physical Exam   Triage Vital Signs: ED Triage Vitals  Enc Vitals Group     BP 04/02/22 0644 (!) 188/92     Pulse Rate 04/02/22 0644 90     Resp 04/02/22 0644 19     Temp 04/02/22 0644 98.3 F (36.8 C)     Temp Source 04/02/22 0644 Oral     SpO2 04/02/22 0644 97 %     Weight 04/02/22 0629 237 lb 3.4 oz (107.6 kg)     Height 04/02/22 0629 '5\' 11"'$  (1.803 m)     Head Circumference --      Peak Flow --      Pain Score 04/02/22 0629 0     Pain Loc --      Pain Edu? --      Excl. in [place]? --     Most recent vital signs: Vitals:   04/02/22 0644  BP: (!) 188/92  Pulse: 90  Resp: 19  Temp: 98.3 F (36.8 C)  SpO2: 97%    Constitutional: Alert and oriented. Eyes: Conjunctivae are normal. Head: Atraumatic. Nose: No congestion/rhinnorhea. Mouth/Throat: Mucous membranes are moist.  Cardiovascular: Normal  rate, regular rhythm. Grossly normal heart sounds.  2+ radial pulses bilaterally. Respiratory: Normal respiratory effort.  No retractions. Lungs CTAB. Gastrointestinal: Soft and nontender. No distention. Genitourinary: Foley catheter in place draining light pink urine. Musculoskeletal: No lower extremity tenderness nor edema.  Neurologic:  Normal speech and language. No gross focal neurologic deficits are appreciated.    ED Results / Procedures / Treatments   Labs (all labs ordered are listed, but only abnormal results are displayed) Labs Reviewed  CBC WITH DIFFERENTIAL/PLATELET - Abnormal; Notable for the following components:      Result Value   RBC 4.02 (*)    Hemoglobin 11.4 (*)    HCT 35.3 (*)    All other components within normal limits  COMPREHENSIVE METABOLIC PANEL - Abnormal; Notable for the following components:   Glucose, Bld 243 (*)    BUN 27 (*)    Creatinine, Ser 1.30 (*)    Albumin 3.4 (*)    GFR, Estimated 55 (*)    All other components within normal limits    PROCEDURES:  Critical Care performed: No  Procedures   MEDICATIONS ORDERED IN ED: Medications - No data to display  IMPRESSION / MDM / ASSESSMENT AND PLAN / ED COURSE  I reviewed the triage vital signs and the nursing notes.                              82 y.o. male with past medical history of hypertension, diabetes, CAD, CHF, PAD, PE on Eliquis, CKD, COPD, and urinary retention with chronic Foley who presents to the ED complaining of pain in his suprapubic area with poor flow from his catheter.  Patient's presentation is most consistent with acute complicated illness / injury requiring diagnostic workup.  Differential diagnosis includes, but is not limited to, obstructed catheter, hematuria, UTI, pyelonephritis, AKI, anemia.  Patient well-appearing and in no acute distress, vital signs are unremarkable.  He has a benign abdominal exam and reports pain has resolved after flushing of his  catheter in triage.  He does have ongoing light pink urine, additional flushing was performed by nursing staff with passage of a few small clots.  Patient reports concerned that he is not urinating due to the blood that he is seeing, but I explained to him that this represents urine mixed with blood and that catheter is functioning appropriately.  Labs are reassuring with no significant anemia, leukocytosis, lecture abnormality, or AKI.  Patient reassured and with no pain at this time, he is appropriate for discharge home with PCP or urology follow-up.  He was counseled to return to the ED for new or worsening symptoms, patient agrees with plan.      FINAL CLINICAL IMPRESSION(S) / ED DIAGNOSES   Final diagnoses:  Urinary retention  Gross hematuria     Rx / DC Orders   ED Discharge Orders     None        Note:  This document was prepared using Dragon voice recognition software and may include unintentional dictation errors.   Blake Divine, MD 04/02/22 [phone]

## 2022-04-03 ENCOUNTER — Encounter: Payer: Self-pay | Admitting: Family Medicine

## 2022-04-03 ENCOUNTER — Ambulatory Visit (INDEPENDENT_AMBULATORY_CARE_PROVIDER_SITE_OTHER): Payer: PPO | Admitting: Family Medicine

## 2022-04-03 VITALS — BP 150/70 | HR 81 | Ht 71.0 in | Wt 245.0 lb

## 2022-04-03 DIAGNOSIS — N138 Other obstructive and reflux uropathy: Secondary | ICD-10-CM

## 2022-04-03 DIAGNOSIS — E1122 Type 2 diabetes mellitus with diabetic chronic kidney disease: Secondary | ICD-10-CM

## 2022-04-03 DIAGNOSIS — I129 Hypertensive chronic kidney disease with stage 1 through stage 4 chronic kidney disease, or unspecified chronic kidney disease: Secondary | ICD-10-CM

## 2022-04-03 DIAGNOSIS — N401 Enlarged prostate with lower urinary tract symptoms: Secondary | ICD-10-CM

## 2022-04-03 LAB — URINE CULTURE: Culture: NO GROWTH

## 2022-04-03 MED ORDER — MAGNESIUM CITRATE PO SOLN: 1.0000 | Freq: Once | ORAL | 0 refills | Status: AC

## 2022-04-03 MED ORDER — RYBELSUS 3 MG PO TABS: 3.0000 mg | ORAL_TABLET | Freq: Every day | ORAL | 0 refills | Status: DC

## 2022-04-03 MED ORDER — NYSTATIN 100000 UNIT/GM EX POWD: 1.0000 | Freq: Three times a day (TID) | CUTANEOUS | 3 refills | Status: DC

## 2022-04-03 NOTE — Progress Notes (Signed)
BP (!) 165/73   Pulse 81   Ht '5\' 11"'$  (1.803 m)   Wt 245 lb (111.1 kg)   SpO2 97%   BMI 34.17 kg/m    Subjective:    Patient ID: Justin Griffith, male    DOB: Aug 03, 1940, 82 y.o.   MRN: [id number]  HPI: Justin Griffith is a 82 y.o. male  No chief complaint on file.  HYPERTENSION  Hypertension status: better  Satisfied with current treatment? yes Duration of hypertension: chronic BP monitoring frequency:  not checking BP medication side effects:  no Medication compliance: good compliance- with pill pack Aspirin: no Recurrent headaches: no Visual changes: no Palpitations: no Dyspnea: no Chest pain: no Lower extremity edema: no Dizzy/lightheaded: no  Has been having issues with his foley. Went to the ER 2x yesterday for clots and inability to drain. To have a PAE procedure with IR in early February. He notes that he's doing OK with it right now, but he would like to get back in with his urologist.   Relevant past medical, surgical, family and social history reviewed and updated as indicated. Interim medical history since our last visit reviewed. Allergies and medications reviewed and updated.  Review of Systems  Constitutional: Negative.   Respiratory: Negative.    Cardiovascular: Negative.   Gastrointestinal: Negative.   Genitourinary:        Issues with his foley  Musculoskeletal: Negative.   Neurological: Negative.   Psychiatric/Behavioral: Negative.      Per HPI unless specifically indicated above     Objective:    BP (!) 165/73   Pulse 81   Ht '5\' 11"'$  (1.803 m)   Wt 245 lb (111.1 kg)   SpO2 97%   BMI 34.17 kg/m   Wt Readings from Last 3 Encounters:  04/03/22 245 lb (111.1 kg)  04/02/22 237 lb 3.4 oz (107.6 kg)  04/01/22 237 lb 3.4 oz (107.6 kg)    Physical Exam Vitals and nursing note reviewed.  Constitutional:      General: He is not in acute distress.    Appearance: Normal appearance. He is obese. He is not ill-appearing, toxic-appearing or  diaphoretic.  HENT:     Head: Normocephalic and atraumatic.     Right Ear: External ear normal.     Left Ear: External ear normal.     Nose: Nose normal.     Mouth/Throat:     Mouth: Mucous membranes are moist.     Pharynx: Oropharynx is clear.  Eyes:     General: No scleral icterus.       Right eye: No discharge.        Left eye: No discharge.     Extraocular Movements: Extraocular movements intact.     Conjunctiva/sclera: Conjunctivae normal.     Pupils: Pupils are equal, round, and reactive to light.  Cardiovascular:     Rate and Rhythm: Normal rate and regular rhythm.     Pulses: Normal pulses.     Heart sounds: Normal heart sounds. No murmur heard.    No friction rub. No gallop.  Pulmonary:     Effort: Pulmonary effort is normal. No respiratory distress.     Breath sounds: Normal breath sounds. No stridor. No wheezing, rhonchi or rales.  Chest:     Chest wall: No tenderness.  Genitourinary:    Comments: Foley in place Musculoskeletal:        General: Normal range of motion.     Cervical back: Normal range  of motion and neck supple.  Skin:    General: Skin is warm and dry.     Capillary Refill: Capillary refill takes less than 2 seconds.     Coloration: Skin is not jaundiced or pale.     Findings: No bruising, erythema, lesion or rash.  Neurological:     General: No focal deficit present.     Mental Status: He is alert and oriented to person, place, and time. Mental status is at baseline.  Psychiatric:        Mood and Affect: Mood normal.        Behavior: Behavior normal.        Thought Content: Thought content normal.        Judgment: Judgment normal.    Results for orders placed or performed during the hospital encounter of 04/02/22  CBC with Differential  Result Value Ref Range   WBC 7.6 4.0 - 10.5 K/uL   RBC 4.02 (L) 4.22 - 5.81 MIL/uL   Hemoglobin 11.4 (L) 13.0 - 17.0 g/dL   HCT 35.3 (L) 39.0 - 52.0 %   MCV 87.8 80.0 - 100.0 fL   MCH 28.4 26.0 - 34.0 pg    MCHC 32.3 30.0 - 36.0 g/dL   RDW 13.4 11.5 - 15.5 %   Platelets 273 150 - 400 K/uL   nRBC 0.0 0.0 - 0.2 %   Neutrophils Relative % 62 %   Neutro Abs 4.7 1.7 - 7.7 K/uL   Lymphocytes Relative 23 %   Lymphs Abs 1.7 0.7 - 4.0 K/uL   Monocytes Relative 9 %   Monocytes Absolute 0.7 0.1 - 1.0 K/uL   Eosinophils Relative 4 %   Eosinophils Absolute 0.3 0.0 - 0.5 K/uL   Basophils Relative 1 %   Basophils Absolute 0.1 0.0 - 0.1 K/uL   Immature Granulocytes 1 %   Abs Immature Granulocytes 0.05 0.00 - 0.07 K/uL  Comprehensive metabolic panel  Result Value Ref Range   Sodium 137 135 - 145 mmol/L   Potassium 3.6 3.5 - 5.1 mmol/L   Chloride 99 98 - 111 mmol/L   CO2 26 22 - 32 mmol/L   Glucose, Bld 243 (H) 70 - 99 mg/dL   BUN 27 (H) 8 - 23 mg/dL   Creatinine, Ser 1.30 (H) 0.61 - 1.24 mg/dL   Calcium 9.7 8.9 - 10.3 mg/dL   Total Protein 7.5 6.5 - 8.1 g/dL   Albumin 3.4 (L) 3.5 - 5.0 g/dL   AST 15 15 - 41 U/L   ALT 10 0 - 44 U/L   Alkaline Phosphatase 57 38 - 126 U/L   Total Bilirubin 0.5 0.3 - 1.2 mg/dL   GFR, Estimated 55 (L) >60 mL/min   Anion gap 12 5 - 15      Assessment & Plan:   Problem List Items Addressed This Visit   None    Follow up plan: No follow-ups on file.

## 2022-04-03 NOTE — Patient Instructions (Addendum)
Dr. [facility] [place] [address], [place], [place] [address] Phone: [phone] Tuesday 04/14/22 9:45AM

## 2022-04-05 ENCOUNTER — Encounter: Payer: Self-pay | Admitting: Family Medicine

## 2022-04-05 NOTE — Assessment & Plan Note (Signed)
Would like to see urology sooner. Will get him in to see them next week at his request. Message sent to urology to check when his appointment is scheduled with IR so we can help make sure he makes the appointment.

## 2022-04-05 NOTE — Assessment & Plan Note (Signed)
Sugars have been running higher. We will start him on rybelsus and recheck 1 month. Call with any concerns.

## 2022-04-05 NOTE — Assessment & Plan Note (Signed)
BP is improving, but still running high. We will recheck in 1 week and see how he's doing. Continue to monitor.

## 2022-04-06 NOTE — Telephone Encounter (Signed)
Spoke with Raquel Sarna and provided verbal OK from provider Dr.Johnson for the patient.

## 2022-04-06 NOTE — Telephone Encounter (Signed)
OK for verbal orders?

## 2022-04-06 NOTE — Telephone Encounter (Signed)
Justin Griffith is calling [facility] is calling to report that [facility] is not working with the patient. Calling to recertify for PT 2 W 3, 1 W 1  CB- [phone] Verbal ok on the VM

## 2022-04-08 ENCOUNTER — Inpatient Hospital Stay: Payer: PPO

## 2022-04-08 ENCOUNTER — Encounter: Payer: Self-pay | Admitting: Oncology

## 2022-04-08 ENCOUNTER — Inpatient Hospital Stay: Payer: PPO | Attending: Oncology | Admitting: Oncology

## 2022-04-08 VITALS — BP 185/82 | HR 74 | Temp 97.0°F | Wt 234.0 lb

## 2022-04-08 DIAGNOSIS — Z818 Family history of other mental and behavioral disorders: Secondary | ICD-10-CM | POA: Diagnosis not present

## 2022-04-08 DIAGNOSIS — Z8719 Personal history of other diseases of the digestive system: Secondary | ICD-10-CM | POA: Diagnosis not present

## 2022-04-08 DIAGNOSIS — J4489 Other specified chronic obstructive pulmonary disease: Secondary | ICD-10-CM | POA: Diagnosis not present

## 2022-04-08 DIAGNOSIS — G4733 Obstructive sleep apnea (adult) (pediatric): Secondary | ICD-10-CM | POA: Insufficient documentation

## 2022-04-08 DIAGNOSIS — I110 Hypertensive heart disease with heart failure: Secondary | ICD-10-CM | POA: Diagnosis not present

## 2022-04-08 DIAGNOSIS — Z7901 Long term (current) use of anticoagulants: Secondary | ICD-10-CM | POA: Diagnosis not present

## 2022-04-08 DIAGNOSIS — I5032 Chronic diastolic (congestive) heart failure: Secondary | ICD-10-CM | POA: Diagnosis not present

## 2022-04-08 DIAGNOSIS — Z79624 Long term (current) use of inhibitors of nucleotide synthesis: Secondary | ICD-10-CM | POA: Insufficient documentation

## 2022-04-08 DIAGNOSIS — N400 Enlarged prostate without lower urinary tract symptoms: Secondary | ICD-10-CM | POA: Diagnosis not present

## 2022-04-08 DIAGNOSIS — Z79899 Other long term (current) drug therapy: Secondary | ICD-10-CM | POA: Diagnosis not present

## 2022-04-08 DIAGNOSIS — E119 Type 2 diabetes mellitus without complications: Secondary | ICD-10-CM | POA: Insufficient documentation

## 2022-04-08 DIAGNOSIS — D508 Other iron deficiency anemias: Secondary | ICD-10-CM

## 2022-04-08 DIAGNOSIS — Z86711 Personal history of pulmonary embolism: Secondary | ICD-10-CM | POA: Insufficient documentation

## 2022-04-08 DIAGNOSIS — Z833 Family history of diabetes mellitus: Secondary | ICD-10-CM | POA: Insufficient documentation

## 2022-04-08 DIAGNOSIS — Z86718 Personal history of other venous thrombosis and embolism: Secondary | ICD-10-CM | POA: Insufficient documentation

## 2022-04-08 DIAGNOSIS — R410 Disorientation, unspecified: Secondary | ICD-10-CM | POA: Insufficient documentation

## 2022-04-08 DIAGNOSIS — Z800 Family history of malignant neoplasm of digestive organs: Secondary | ICD-10-CM | POA: Insufficient documentation

## 2022-04-08 DIAGNOSIS — R509 Fever, unspecified: Secondary | ICD-10-CM | POA: Diagnosis not present

## 2022-04-08 DIAGNOSIS — E785 Hyperlipidemia, unspecified: Secondary | ICD-10-CM | POA: Insufficient documentation

## 2022-04-08 DIAGNOSIS — Z9049 Acquired absence of other specified parts of digestive tract: Secondary | ICD-10-CM | POA: Insufficient documentation

## 2022-04-08 DIAGNOSIS — I2510 Atherosclerotic heart disease of native coronary artery without angina pectoris: Secondary | ICD-10-CM | POA: Insufficient documentation

## 2022-04-08 DIAGNOSIS — Z888 Allergy status to other drugs, medicaments and biological substances status: Secondary | ICD-10-CM | POA: Insufficient documentation

## 2022-04-08 NOTE — Progress Notes (Signed)
Hematology/Oncology Progress note [phone] [phone]      CHIEF COMPLAINTS/REASON FOR VISIT:  history of PE  HISTORY OF PRESENTING ILLNESS:   Justin Griffith is a  82 y.o.  male with PMH listed below was seen in consultation at the request of  Valerie Roys, DO  for evaluation of history of pulmonary embolism.   Patient has extensive medical problems, including  BPH, status post HoLEP procedure 5/64/3329,  diastolic CHF, CAD status post CABG, OSA, DVT, COPD, dyslipidemia and hypertension, and history pulmonary embolism.   09/19/21 1 day after his urology surgery, he was found down in his front yard on the ground and was last seen around lunchtime. He was febrile and confused with EMS. He was hypoxic in ED  09/19/2021  Chest CTA revealed the following  1. Positive for pulmonary emboli. There are multiple bilateral segmental pulmonary emboli. Positive for acute PE with CT evidence of right heart strain (RV/LV Ratio = 1.15) consistent with at least submassive (intermediate risk) PE. The presence of right heart strain has been associated with an increased risk of morbidity and mortality. Please refer to the "Code PE Focused" order set in EPIC. 2. Lungs show evidence of vascular shunting and atelectasis, but no evidence of an infarct, pleural effusion or pneumothorax. 3. Mild enlargement of the main pulmonary artery tp 3.9 cm  Patient was treated with anticoagulation with heparin gtt, switched to Eliquis at discharge. Vascular surgeon recommend no intervention. He also was found to have UTI and was treated with antibiotics.   He has no been on anticoagulation with Eliquis '5mg'$  BID for 5.5 months. He was referred to establish care for determination of duration of anticoagulation.   INTERVAL HISTORY Justin Griffith is a 82 y.o. male who has above history reviewed by me today presents for follow up visit for pulmonary embolism. Patient presents to discuss results today.   Her primary care provider has already decreased Eliquis to 2.5 mg twice daily.  Patient tolerates well.  No new complaints.  MEDICAL HISTORY:  Past Medical History:  Diagnosis Date   Acute urinary retention    Arthritis    Asthma 2000   Back pain    Colon polyp 2012   COPD (chronic obstructive pulmonary disease) (HCC)    Emphysema of lung (HCC)    Heart murmur    Hernia 2000   History of cold sores 05/06/2020   History of colon cancer    Hyperlipidemia    Hypertension    Personal history of malignant neoplasm of large intestine    Personal history of tobacco use, presenting hazards to health    Septic shock ([place]) 11/18/2021   Sleep apnea    Special screening for malignant neoplasms, colon    Type 2 diabetes mellitus with proteinuria ([place]) 10/11/2014    SURGICAL HISTORY: Past Surgical History:  Procedure Laterality Date   CARDIAC CATHETERIZATION Left 09/25/2015   Procedure: Left Heart Cath and Coronary Angiography;  Surgeon: Yolonda Kida, MD;  Location: [place] [facility];  Service: Cardiovascular;  Laterality: Left;   CHOLECYSTECTOMY  1970   COLON SURGERY  07-16-99   sigmoid colon resection with primary anastomosis, chemotherapy for metastatic disease   COLONOSCOPY  2001, 2012   Dr Bary Castilla, tubular adenoma of the cecum and ascending colon in 2012.   COLONOSCOPY WITH PROPOFOL N/A 02/12/2016   Procedure: COLONOSCOPY WITH PROPOFOL;  Surgeon: Robert Bellow, MD;  Location: [facility];  Service: Endoscopy;  Laterality: N/A;  CORONARY STENT INTERVENTION N/A 05/17/2020   Procedure: CORONARY STENT INTERVENTION;  Surgeon: Yolonda Kida, MD;  Location: [place] [facility];  Service: Cardiovascular;  Laterality: N/A;  LAD   ESOPHAGOGASTRODUODENOSCOPY (EGD) WITH PROPOFOL N/A 02/12/2016   Procedure: ESOPHAGOGASTRODUODENOSCOPY (EGD) WITH PROPOFOL;  Surgeon: Robert Bellow, MD;  Location: [facility] END[facility];  Service: Endoscopy;  Laterality: N/A;   HERNIA REPAIR Right     right inguinal hernia repair   IR RADIOLOGIST EVAL & MGMT  02/27/2022   IR RADIOLOGIST EVAL & MGMT  03/25/2022   JOINT REPLACEMENT Bilateral    knee replacement   KNEE SURGERY Bilateral 2010   LEFT HEART CATH AND CORONARY ANGIOGRAPHY N/A 05/17/2020   Procedure: LEFT HEART CATH AND CORONARY ANGIOGRAPHY possible PCI and stent;  Surgeon: Yolonda Kida, MD;  Location: [place] [facility];  Service: Cardiovascular;  Laterality: N/A;   MYRINGOTOMY WITH TUBE PLACEMENT Bilateral 08/16/2014   Procedure: MYRINGOTOMY WITH TUBE PLACEMENT;  Surgeon: Carloyn Manner, MD;  Location: [facility];  Service: ENT;  Laterality: Bilateral;   PERIPHERAL VASCULAR CATHETERIZATION Right 09/04/2015   Procedure: Lower Extremity Angiography;  Surgeon: Katha Cabal, MD;  Location: [place] [facility];  Service: Cardiovascular;  Laterality: Right;   PERIPHERAL VASCULAR CATHETERIZATION  09/04/2015   Procedure: Lower Extremity Intervention;  Surgeon: Katha Cabal, MD;  Location: [place] [facility];  Service: Cardiovascular;;   TONSILLECTOMY     TYMPANOSTOMY TUBE PLACEMENT      SOCIAL HISTORY: Social History   Socioeconomic History   Marital status: Married    Spouse name: Not on file   Number of children: Not on file   Years of education: Not on file   Highest education level: GED or equivalent  Occupational History   Occupation: retired    Comment: [employer]  Tobacco Use   Smoking status: Former    Packs/day: 1.00    Years: 30.00    Total pack years: 30.00    Types: Cigarettes    Quit date: 03/23/1989    Years since quitting: 33.0    Passive exposure: Past   Smokeless tobacco: Never  Vaping Use   Vaping Use: Never used  Substance and Sexual Activity   Alcohol use: No    Alcohol/week: 0.0 standard drinks of alcohol   Drug use: No   Sexual activity: Not Currently  Other Topics Concern   Not on file  Social History Narrative   [employer]    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    Social Determinants of Health    Financial Resource Strain: Low Risk  (03/03/2022)   Overall Financial Resource Strain (CARDIA)    Difficulty of Paying Living Expenses: Not hard at all  Food Insecurity: No Food Insecurity (03/03/2022)   Hunger Vital Sign    Worried About Running Out of Food in the Last Year: Never true    [place] in the Last Year: Never true  Transportation Needs: No Transportation Needs (03/03/2022)   PRAPARE - Hydrologist (Medical): No    Lack of Transportation (Non-Medical): No  Physical Activity: Inactive (03/03/2022)   Exercise Vital Sign    Days of Exercise per Week: 0 days    Minutes of Exercise per Session: 0 min  Stress: No Stress Concern Present (03/03/2022)   [place]    Feeling of Stress : Only a little  Social Connections: Moderately Integrated (03/03/2022)   Social Connection and  Isolation Panel [NHANES]    Frequency of Communication with Friends and Family: More than three times a week    Frequency of Social Gatherings with Friends and Family: More than three times a week    Attends Religious Services: More than 4 times per year    Active Member of [employer] or Organizations: No    Attends Archivist Meetings: Never    Marital Status: Married  Human resources officer Violence: Not At Risk (03/03/2022)   Humiliation, Afraid, Rape, and Kick questionnaire    Fear of Current or Ex-Partner: No    Emotionally Abused: No    Physically Abused: No    Sexually Abused: No    FAMILY HISTORY: Family History  Problem Relation Age of Onset   Diabetes Father    Esophageal cancer Mother    Alzheimer's disease Paternal Uncle     ALLERGIES:  is allergic to farxiga [dapagliflozin], dulaglutide, liraglutide, and jardiance [empagliflozin].  MEDICATIONS:  Current Outpatient Medications  Medication Sig Dispense Refill   acyclovir (ZOVIRAX) 400 MG tablet Take 1 tablet (400 mg total) by  mouth 2 (two) times daily. 180 tablet 1   apixaban (ELIQUIS) 2.5 MG TABS tablet Take 1 tablet (2.5 mg total) by mouth 2 (two) times daily. For blood thinner 60 tablet 1   ascorbic acid (VITAMIN C) 500 MG tablet Take 1 tablet (500 mg total) by mouth daily. To keep your immune system up 30 tablet 0   blood glucose meter kit and supplies 1 each by Other route daily. Dispense based on patient and insurance preference. Use up to four times daily as directed. (FOR ICD-10 E10.9, E11.9). 1 each 0   cephALEXin (KEFLEX) 500 MG capsule Take 2 capsules (1,000 mg total) by mouth 2 (two) times daily. 28 capsule 0   cloNIDine (CATAPRES) 0.1 MG tablet Take 1 tablet (0.1 mg total) by mouth 2 (two) times daily. For blood pressure 60 tablet 1   gabapentin (NEURONTIN) 300 MG capsule Take 1 capsule (300 mg total) by mouth 3 (three) times daily. For nerve pain and itching 90 capsule 3   hydrALAZINE (APRESOLINE) 50 MG tablet 1 tab 3 times a day for blood pressure 90 tablet 1   losartan (COZAAR) 100 MG tablet Take 1 tablet (100 mg total) by mouth daily. For blood pressure 90 tablet 1   Multiple Vitamin (MULTIVITAMIN WITH MINERALS) TABS tablet Take 1 tablet by mouth daily. For nutrition 30 tablet 2   nystatin (MYCOSTATIN/NYSTOP) powder Apply 1 Application topically 3 (three) times daily. 60 g 3   omeprazole (PRILOSEC) 40 MG capsule Take 1 capsule (40 mg total) by mouth daily. For heartburn 30 capsule 1   polyethylene glycol powder (GLYCOLAX/MIRALAX) 17 GM/SCOOP powder Take 17 g by mouth 2 (two) times daily as needed. 3350 g 1   rosuvastatin (CRESTOR) 10 MG tablet Take 1 tablet (10 mg total) by mouth at bedtime. For cholesterol 30 tablet 1   Semaglutide (RYBELSUS) 3 MG TABS Take 3 mg by mouth daily. 30 tablet 0   sodium hypochlorite (DAKIN'S 1/2 STRENGTH) external solution Apply 1 Application topically daily at 12 noon. Topically to wound daily to clean.     Torsemide 40 MG TABS Take by mouth.     umeclidinium bromide  (INCRUSE ELLIPTA) 62.5 MCG/ACT AEPB Inhale 1 puff into the lungs daily. For your breathing 30 each 3   Zinc Oxide 10 % OINT Apply topically.     No current facility-administered medications for this visit.  Review of Systems  Constitutional:  Positive for fatigue. Negative for chills, fever and unexpected weight change.  HENT:   Negative for hearing loss and voice change.   Eyes:  Negative for eye problems and icterus.  Respiratory:  Negative for chest tightness, cough and shortness of breath.   Cardiovascular:  Positive for leg swelling. Negative for chest pain.  Gastrointestinal:  Negative for abdominal distention and abdominal pain.  Endocrine: Negative for hot flashes.  Genitourinary:  Positive for difficulty urinating. Negative for dysuria and frequency.   Musculoskeletal:  Positive for back pain. Negative for arthralgias.  Skin:  Negative for itching and rash.  Neurological:  Negative for light-headedness and numbness.  Hematological:  Negative for adenopathy. Bruises/bleeds easily.  Psychiatric/Behavioral:  Negative for confusion.    PHYSICAL EXAMINATION: Vitals:   04/08/22 1401  BP: (!) 185/82  Pulse: 74  Temp: (!) 97 F (36.1 C)  SpO2: 96%   Filed Weights   04/08/22 1401  Weight: 234 lb (106.1 kg)    Physical Exam Constitutional:      General: He is not in acute distress.    Appearance: He is obese.     Comments: Patient sits in the wheel chair.   HENT:     Head: Normocephalic and atraumatic.  Eyes:     General: No scleral icterus. Cardiovascular:     Rate and Rhythm: Normal rate.  Pulmonary:     Effort: Pulmonary effort is normal. No respiratory distress.     Breath sounds: No wheezing.  Abdominal:     General: Bowel sounds are normal. There is no distension.     Palpations: Abdomen is soft.  Genitourinary:    Comments: + Foley catheter Musculoskeletal:        General: No deformity. Normal range of motion.     Cervical back: Normal range of motion  and neck supple.     Right lower leg: Edema present.  Skin:    General: Skin is warm and dry.     Findings: No erythema or rash.  Neurological:     Mental Status: He is alert and oriented to person, place, and time. Mental status is at baseline.     Cranial Nerves: No cranial nerve deficit.     Coordination: Coordination normal.  Psychiatric:        Mood and Affect: Mood normal.     LABORATORY DATA:  I have reviewed the data as listed    Latest Ref Rng & Units 04/02/2022    6:38 AM 04/01/2022    7:30 PM 03/10/2022    3:51 PM  CBC  WBC 4.0 - 10.5 K/uL 7.6  6.7  6.2   Hemoglobin 13.0 - 17.0 g/dL 11.4  11.6  11.6   Hematocrit 39.0 - 52.0 % 35.3  36.0  35.6   Platelets 150 - 400 K/uL 273  297  312       Latest Ref Rng & Units 04/02/2022    6:38 AM 04/01/2022    7:30 PM 02/23/2022    4:11 PM  CMP  Glucose 70 - 99 mg/dL 243  279  149   BUN 8 - 23 mg/dL '27  27  11   '$ Creatinine 0.61 - 1.24 mg/dL 1.30  1.55  1.04   Sodium 135 - 145 mmol/L 137  138  140   Potassium 3.5 - 5.1 mmol/L 3.6  3.8  3.5   Chloride 98 - 111 mmol/L 99  101  104   CO2 22 -  32 mmol/L '26  27  22   '$ Calcium 8.9 - 10.3 mg/dL 9.7  9.4  10.0   Total Protein 6.5 - 8.1 g/dL 7.5  7.4    Total Bilirubin 0.3 - 1.2 mg/dL 0.5  0.5    Alkaline Phos 38 - 126 U/L 57  57    AST 15 - 41 U/L 15  16    ALT 0 - 44 U/L 10  10        RADIOGRAPHIC STUDIES: I have personally reviewed the radiological images as listed and agreed with the findings in the report. IR Radiologist Eval & Mgmt  Result Date: 03/25/2022 EXAM: ESTABLISHED PATIENT OFFICE VISIT CHIEF COMPLAINT: See Epic note. HISTORY OF PRESENT ILLNESS: See Epic note. REVIEW OF SYSTEMS: See Epic note. PHYSICAL EXAMINATION: See Epic note. ASSESSMENT AND PLAN: See Epic note. Ruthann Cancer, MD [facility] Radiology Electronically Signed   By: Ruthann Cancer M.D.   On: 03/25/2022 13:12   IR Radiologist Eval & Mgmt  Result Date:  02/27/2022 EXAM: NEW PATIENT OFFICE VISIT CHIEF COMPLAINT: See Epic note. HISTORY OF PRESENT ILLNESS: See Epic note. REVIEW OF SYSTEMS: See Epic note. PHYSICAL EXAMINATION: See Epic note. ASSESSMENT AND PLAN: See Epic note. Ruthann Cancer, MD [facility] Radiology Electronically Signed   By: Ruthann Cancer M.D.   On: 02/27/2022 12:09   CT ABDOMEN PELVIS WO CONTRAST  Result Date: 01/31/2022 CLINICAL DATA:  Abdominal pain after Foley catheter replacement. EXAM: CT ABDOMEN AND PELVIS WITHOUT CONTRAST TECHNIQUE: Multidetector CT imaging of the abdomen and pelvis was performed following the standard protocol without IV contrast. RADIATION DOSE REDUCTION: This exam was performed according to the departmental dose-optimization program which includes automated exposure control, adjustment of the mA and/or kV according to patient size and/or use of iterative reconstruction technique. COMPARISON:  September 30, 2021 and February 11, 2020 FINDINGS: Lower chest: A stable, likely benign 7 mm noncalcified lung nodule is seen within the right lung base. Hepatobiliary: No focal liver abnormality is seen. Status post cholecystectomy. No biliary dilatation. Pancreas: Unremarkable. No pancreatic ductal dilatation or surrounding inflammatory changes. Spleen: Normal in size without focal abnormality. Adrenals/Urinary Tract: Adrenal glands are unremarkable. Kidneys are normal in size, without focal lesions. Mild to moderate severity bilateral hydronephrosis and hydroureter are seen. A mild amount of air is seen within the lumen of a markedly distended urinary bladder. A Foley catheter is in place with its distal tip and insufflator bulb noted within an enlarged prostate gland. Stomach/Bowel: Stomach is within normal limits. The appendix is not clearly identified. Surgically anastomosed bowel is seen within the mid sigmoid colon. A large amount of stool is seen throughout the sigmoid colon. No  evidence of bowel wall thickening, distention, or inflammatory changes. Vascular/Lymphatic: Aortic atherosclerosis. No enlarged abdominal or pelvic lymph nodes. Reproductive: The prostate gland is markedly enlarged. Other: No abdominal wall hernia or abnormality. No abdominopelvic ascites. Musculoskeletal: Marked severity multilevel degenerative changes are seen throughout the lumbar spine. IMPRESSION: 1. Malpositioned Foley catheter, as described above. 2. Markedly enlarged prostate gland with subsequent bladder outlet obstruction. 3. Evidence of prior cholecystectomy. 4. Postoperative changes and large amount of stool involving the sigmoid colon. 5. Stable, likely benign 7 mm noncalcified right basilar lung nodule. 6. Aortic atherosclerosis. Aortic Atherosclerosis (ICD10-I70.0). Electronically Signed   By: Virgina Norfolk M.D.   On: 01/31/2022 00:22       ASSESSMENT & PLAN:   History of pulmonary embolism 09/18/21  No obvious provoking events prior to diagnosis of pulmonary embolism.  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  Check beta glycoprotein antibodies.  Continue Elqiuis 2.'5mg'$  BID for prophylaxis.  Recommend patient to continue follow-up with primary care provider. He may reestablish care in the future if clinically indicated.  Iron deficiency anemia Iron panel is consistent with iron deficiency anemia Recommend oral iron supplementation.   Orders Placed This Encounter  Procedures   Beta-2-glycoprotein i abs, IgG/M/A    Standing Status:   Future    Number of Occurrences:   1    Standing Expiration Date:   04/09/2023   Follow-up as needed. All questions were answered. The patient knows to call the clinic with any problems, questions or concerns.  Valerie Roys,  DO    Thank you for this kind referral and the opportunity to participate in the care of this patient. A copy of today's note is routed to referring provider   Earlie Server, MD, PhD [facility] Hematology Oncology 04/08/2022

## 2022-04-08 NOTE — Assessment & Plan Note (Signed)
No obvious provoking events prior to diagnosis of pulmonary embolism.  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  Check beta glycoprotein antibodies.  Continue Elqiuis 2.'5mg'$  BID for prophylaxis.  Recommend patient to continue follow-up with primary care provider. He may reestablish care in the future if clinically indicated.

## 2022-04-08 NOTE — Assessment & Plan Note (Addendum)
Iron panel is consistent with iron deficiency anemia Recommend oral iron supplementation.

## 2022-04-09 ENCOUNTER — Encounter: Payer: Self-pay | Admitting: Family Medicine

## 2022-04-09 ENCOUNTER — Ambulatory Visit (INDEPENDENT_AMBULATORY_CARE_PROVIDER_SITE_OTHER): Payer: PPO | Admitting: Family Medicine

## 2022-04-09 ENCOUNTER — Telehealth: Payer: Self-pay

## 2022-04-09 VITALS — BP 170/100 | HR 79 | Ht 71.0 in | Wt 239.0 lb

## 2022-04-09 DIAGNOSIS — E785 Hyperlipidemia, unspecified: Secondary | ICD-10-CM

## 2022-04-09 DIAGNOSIS — D692 Other nonthrombocytopenic purpura: Secondary | ICD-10-CM

## 2022-04-09 DIAGNOSIS — I129 Hypertensive chronic kidney disease with stage 1 through stage 4 chronic kidney disease, or unspecified chronic kidney disease: Secondary | ICD-10-CM

## 2022-04-09 MED ORDER — HYDRALAZINE HCL 50 MG PO TABS: 75.0000 mg | ORAL_TABLET | Freq: Three times a day (TID) | ORAL | 1 refills | Status: DC

## 2022-04-09 MED ORDER — OLOPATADINE HCL 0.2 % OP SOLN: 1.0000 [drp] | Freq: Every day | OPHTHALMIC | 3 refills | Status: DC

## 2022-04-09 MED ORDER — APIXABAN 2.5 MG PO TABS: 2.5000 mg | ORAL_TABLET | Freq: Two times a day (BID) | ORAL | 1 refills | Status: DC

## 2022-04-09 MED ORDER — OMEPRAZOLE 40 MG PO CPDR: 40.0000 mg | DELAYED_RELEASE_CAPSULE | Freq: Every day | ORAL | 1 refills | Status: DC

## 2022-04-09 MED ORDER — GABAPENTIN 300 MG PO CAPS: 300.0000 mg | ORAL_CAPSULE | Freq: Three times a day (TID) | ORAL | 3 refills | Status: DC

## 2022-04-09 MED ORDER — CLONIDINE HCL 0.1 MG PO TABS: 0.1000 mg | ORAL_TABLET | Freq: Two times a day (BID) | ORAL | 1 refills | Status: DC

## 2022-04-09 MED ORDER — LOSARTAN POTASSIUM 100 MG PO TABS: 100.0000 mg | ORAL_TABLET | Freq: Every day | ORAL | 1 refills | Status: DC

## 2022-04-09 MED ORDER — ROSUVASTATIN CALCIUM 10 MG PO TABS: 10.0000 mg | ORAL_TABLET | Freq: Every day | ORAL | 1 refills | Status: DC

## 2022-04-09 NOTE — Progress Notes (Signed)
BP (!) 170/100 (BP Location: Right Arm, Cuff Size: Large)   Pulse 79   Ht '5\' 11"'$  (1.803 m)   Wt 239 lb (108.4 kg)   SpO2 98%   BMI 33.33 kg/m    Subjective:    Patient ID: Justin Griffith, male    DOB: [birth year], 82 y.o.   MRN: [id number]  HPI: Justin Griffith is a 82 y.o. male  Chief Complaint  Patient presents with   Diabetes   Hypertension   HYPERTENSION Hypertension status: better  Satisfied with current treatment? no Duration of hypertension: chronic BP monitoring frequency:   when home health nurse comes over BP range: 140s/90s BP medication side effects:  no Medication compliance: good compliance Previous BP meds:clonidine, hydralazine, torsemide, losartan Aspirin: no Recurrent headaches: no Visual changes: no Palpitations: no Dyspnea: no Chest pain: no Lower extremity edema: no Dizzy/lightheaded: no  Relevant past medical, surgical, family and social history reviewed and updated as indicated. Interim medical history since our last visit reviewed. Allergies and medications reviewed and updated.  Review of Systems  Constitutional: Negative.   Respiratory: Negative.    Cardiovascular: Negative.   Gastrointestinal: Negative.   Musculoskeletal: Negative.   Psychiatric/Behavioral: Negative.      Justin HPI unless specifically indicated above     Objective:    BP (!) 170/100 (BP Location: Right Arm, Cuff Size: Large)   Pulse 79   Ht '5\' 11"'$  (1.803 m)   Wt 239 lb (108.4 kg)   SpO2 98%   BMI 33.33 kg/m   Wt Readings from Last 3 Encounters:  04/09/22 239 lb (108.4 kg)  04/08/22 234 lb (106.1 kg)  04/03/22 245 lb (111.1 kg)    Physical Exam Vitals and nursing note reviewed.  Constitutional:      General: He is not in acute distress.    Appearance: Normal appearance. He is obese. He is not ill-appearing, toxic-appearing or diaphoretic.  HENT:     Head: Normocephalic and atraumatic.     Right Ear: External ear normal.     Left Ear: External ear  normal.     Nose: Nose normal.     Mouth/Throat:     Mouth: Mucous membranes are moist.     Pharynx: Oropharynx is clear.  Eyes:     General: No scleral icterus.       Right eye: No discharge.        Left eye: No discharge.     Extraocular Movements: Extraocular movements intact.     Conjunctiva/sclera: Conjunctivae normal.     Pupils: Pupils are equal, round, and reactive to light.  Cardiovascular:     Rate and Rhythm: Normal rate and regular rhythm.     Pulses: Normal pulses.     Heart sounds: Normal heart sounds. No murmur heard.    No friction rub. No gallop.  Pulmonary:     Effort: Pulmonary effort is normal. No respiratory distress.     Breath sounds: Normal breath sounds. No stridor. No wheezing, rhonchi or rales.  Chest:     Chest wall: No tenderness.  Musculoskeletal:        General: Normal range of motion.     Cervical back: Normal range of motion and neck supple.  Skin:    General: Skin is warm and dry.     Capillary Refill: Capillary refill takes less than 2 seconds.     Coloration: Skin is not jaundiced or pale.     Findings: No bruising, erythema, lesion  or rash.  Neurological:     General: No focal deficit present.     Mental Status: He is alert and oriented to person, place, and time. Mental status is at baseline.  Psychiatric:        Mood and Affect: Mood normal.        Behavior: Behavior normal.        Thought Content: Thought content normal.        Judgment: Judgment normal.     Results for orders placed or performed during the hospital encounter of 04/02/22  CBC with Differential  Result Value Ref Range   WBC 7.6 4.0 - 10.5 K/uL   RBC 4.02 (L) 4.22 - 5.81 MIL/uL   Hemoglobin 11.4 (L) 13.0 - 17.0 g/dL   HCT 35.3 (L) 39.0 - 52.0 %   MCV 87.8 80.0 - 100.0 fL   MCH 28.4 26.0 - 34.0 pg   MCHC 32.3 30.0 - 36.0 g/dL   RDW 13.4 11.5 - 15.5 %   Platelets 273 150 - 400 K/uL   nRBC 0.0 0.0 - 0.2 %   Neutrophils Relative % 62 %   Neutro Abs 4.7 1.7 - 7.7  K/uL   Lymphocytes Relative 23 %   Lymphs Abs 1.7 0.7 - 4.0 K/uL   Monocytes Relative 9 %   Monocytes Absolute 0.7 0.1 - 1.0 K/uL   Eosinophils Relative 4 %   Eosinophils Absolute 0.3 0.0 - 0.5 K/uL   Basophils Relative 1 %   Basophils Absolute 0.1 0.0 - 0.1 K/uL   Immature Granulocytes 1 %   Abs Immature Granulocytes 0.05 0.00 - 0.07 K/uL  Comprehensive metabolic panel  Result Value Ref Range   Sodium 137 135 - 145 mmol/L   Potassium 3.6 3.5 - 5.1 mmol/L   Chloride 99 98 - 111 mmol/L   CO2 26 22 - 32 mmol/L   Glucose, Bld 243 (H) 70 - 99 mg/dL   BUN 27 (H) 8 - 23 mg/dL   Creatinine, Ser 1.30 (H) 0.61 - 1.24 mg/dL   Calcium 9.7 8.9 - 10.3 mg/dL   Total Protein 7.5 6.5 - 8.1 g/dL   Albumin 3.4 (L) 3.5 - 5.0 g/dL   AST 15 15 - 41 U/L   ALT 10 0 - 44 U/L   Alkaline Phosphatase 57 38 - 126 U/L   Total Bilirubin 0.5 0.3 - 1.2 mg/dL   GFR, Estimated 55 (L) >60 mL/min   Anion gap 12 5 - 15      Assessment & Plan:   Problem List Items Addressed This Visit       Cardiovascular and Mediastinum   Senile purpura ([place])    Reassured patient. Continue to monitor.       Relevant Medications   hydrALAZINE (APRESOLINE) 50 MG tablet   apixaban (ELIQUIS) 2.5 MG TABS tablet   cloNIDine (CATAPRES) 0.1 MG tablet   losartan (COZAAR) 100 MG tablet   rosuvastatin (CRESTOR) 10 MG tablet     Genitourinary   Benign hypertensive renal disease - Primary    Still running high. Will increase his hydralazine to '75mg'$  and recheck 3 weeks. Call with any concerns.         Other   HLD (hyperlipidemia)   Relevant Medications   hydrALAZINE (APRESOLINE) 50 MG tablet   apixaban (ELIQUIS) 2.5 MG TABS tablet   cloNIDine (CATAPRES) 0.1 MG tablet   losartan (COZAAR) 100 MG tablet   rosuvastatin (CRESTOR) 10 MG tablet     Follow  up plan: Return in about 3 weeks (around 04/30/2022).

## 2022-04-09 NOTE — Telephone Encounter (Signed)
Unable to reach pt by phone x2. Detailed VM left.

## 2022-04-09 NOTE — Telephone Encounter (Signed)
-----  Message from Earlie Server, MD sent at 04/08/2022  9:36 PM EST ----- Please let patient know that his blood work also showed mild iron deficiency.  Recommend him to take oral iron supplementation over-the-counter supply.

## 2022-04-09 NOTE — Assessment & Plan Note (Signed)
Reassured patient. Continue to monitor.  

## 2022-04-09 NOTE — Assessment & Plan Note (Signed)
Still running high. Will increase his hydralazine to '75mg'$  and recheck 3 weeks. Call with any concerns.

## 2022-04-10 ENCOUNTER — Telehealth (HOSPITAL_COMMUNITY): Payer: Self-pay | Admitting: Radiology

## 2022-04-10 LAB — BETA-2-GLYCOPROTEIN I ABS, IGG/M/A
Beta-2 Glyco I IgG: <9 GPI IgG units (ref 0–20)
Beta-2-Glycoprotein I IgA: <9 GPI IgA units (ref 0–25)
Beta-2-Glycoprotein I IgM: <9 GPI IgM units (ref 0–32)

## 2022-04-10 NOTE — Telephone Encounter (Signed)
Called pt to schedule PAE with Dr. Serafina Royals on 04/23/22. Left VM for patient to call back. JM

## 2022-04-13 ENCOUNTER — Telehealth: Payer: Self-pay | Admitting: Family Medicine

## 2022-04-13 NOTE — Telephone Encounter (Signed)
Copied from [place] [id number]. Topic: General - Other >> Apr 13, 2022  4:09 PM Chapman Fitch wrote: Reason for CRM: Pt received a bill from collection agency for $260 / for DOS 1.31.2023 for Dr. Neomia Dear / pt didn't know he had a balance and now he has received collection notice / please advise/ pts [employer] insurance didn't pay anything/ pt also had [place] [employer]/ please advise if there was an issue with insurance not being active and needing to be claimed on other insurance/ pt stated he will come by tomorrow to bring insurance and bill

## 2022-04-14 ENCOUNTER — Ambulatory Visit: Payer: PPO | Admitting: Physician Assistant

## 2022-04-16 ENCOUNTER — Other Ambulatory Visit: Payer: Self-pay | Admitting: Family Medicine

## 2022-04-17 NOTE — Telephone Encounter (Signed)
Requested Prescriptions  Pending Prescriptions Disp Refills   GNP VITAMIN C 500 MG tablet [Pharmacy Med Name: [place] 500 MG TAB] 30 tablet 0    Sig: TAKE 1 TABLET BY MOUTH ONCE DAILY TO [place] UP     Endocrinology:  Vitamins Passed - 04/16/2022  3:41 PM      Passed - Valid encounter within last 12 months    Recent Outpatient Visits           1 week ago Benign hypertensive renal disease   [place] [facility] [place], Megan P, DO   2 weeks ago Type 2 diabetes mellitus with chronic kidney disease, without long-term current use of insulin, unspecified CKD stage [facility])   [place], Megan P, DO   3 weeks ago Benign hypertensive renal disease   [place] [facility] [place], Megan P, DO   4 weeks ago Benign hypertensive renal disease   [place] [facility] [place], Pace, DO   1 month ago Benign hypertensive renal disease   [place], Barb Merino, DO       Future Appointments             In 1 week Wynetta Emery, Barb Merino, DO [place], [place]   In 1 month Diamantina [place], Herbert Seta, MD [place]

## 2022-04-20 ENCOUNTER — Ambulatory Visit (INDEPENDENT_AMBULATORY_CARE_PROVIDER_SITE_OTHER): Payer: PPO

## 2022-04-20 ENCOUNTER — Telehealth: Payer: PPO

## 2022-04-20 DIAGNOSIS — I129 Hypertensive chronic kidney disease with stage 1 through stage 4 chronic kidney disease, or unspecified chronic kidney disease: Secondary | ICD-10-CM

## 2022-04-20 DIAGNOSIS — E1122 Type 2 diabetes mellitus with diabetic chronic kidney disease: Secondary | ICD-10-CM

## 2022-04-20 DIAGNOSIS — I5032 Chronic diastolic (congestive) heart failure: Secondary | ICD-10-CM

## 2022-04-20 NOTE — Chronic Care Management (AMB) (Signed)
Chronic Care Management   CCM RN Visit Note  04/20/2022 Name: Justin Griffith MRN: [id number] DOB: 1940-11-25  Subjective: Justin Griffith is a 82 y.o. year old male who is a primary care patient of Valerie Roys, DO. The patient was referred to the Chronic Care Management team for assistance with care management needs subsequent to provider initiation of CCM services and plan of care.    Today's Visit:  Engaged with patient by telephone for follow up visit.        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   Planned Interventions: Provided patient with basic written and verbal COPD education on self care/management/and exacerbation prevention. The patient denies any issues with his breathing. The patient states that his biggest concern now is urinary retention and the cramps he is having in his legs and feet. He will follow up with the pcp tomorrow. Advised patient to track and manage COPD triggers. Review of factors that cause exacerbation. The patient states his breathing is stable right now Provided written and verbal instructions on pursed lip breathing and utilized returned demonstration as teach back Provided instruction about proper use of medications used for management of COPD including inhalers. States compliance with medications.  Advised patient to self assesses COPD action plan zone and make appointment with provider if in the yellow  zone for 48 hours without improvement Provided education about and advised patient to utilize infection prevention strategies to reduce risk of respiratory infection Discussed the importance of adequate rest and management of fatigue with COPD Screening for signs and symptoms of depression related to chronic disease state  Assessed social determinant of health barriers Pharm D referral for assistance with [place] cost constraints. The patient is working with the pharm D.   Symptom Management: Take medications as prescribed   Attend all scheduled provider appointments Call provider office for new concerns or questions  call the Suicide and Crisis Lifeline: 988 call the [place] National Suicide Prevention Lifeline: 640-628-7004 or TTY: (276)366-1177 TTY (267) 184-2844) to talk to a trained counselor call 1-800-273-TALK (toll free, 24 hour hotline) if experiencing a Mental Health or [place]  avoid second hand smoke eliminate smoking in my home identify and remove indoor air pollutants limit outdoor activity during cold weather listen for public air quality announcements every day develop a rescue plan eliminate symptom triggers at home follow rescue plan if symptoms flare-up  Follow Up Plan: Telephone follow up appointment with care management team member scheduled for: 05-11-2022 at 1 pm       CCM Expected Outcome:  Monitor, Self-Manage and Reduce Symptoms of Diabetes       Current Barriers:  Knowledge Deficits related to the importance of good blood sugar control in a patient with DM Care Coordination needs related to ongoing education and support for management of medications and disease management  in a patient with DM Chronic Disease  Management support and education needs related to effective management of DM Lacks caregiver support.   Lab Results  Component Value Date   HGBA1C 6.3 (H) 02/04/2022     Planned Interventions: Provided education to patient about  basic DM disease process; Reviewed medications with patient and discussed importance of medication.  adherence. The patient is now using pill packs and he says this is working out well for him. Encouraged him to take his medications as prescribed and call for any questions or concerns. Works with the [place]. Education and support given;        Reviewed prescribed diet with patient heart healthy/ADA diet. The patient states compliance with heart healthy/Ada diet. Education given on monitoring for hidden sodium; Counseled on importance of regular laboratory monitoring as prescribed. Has regular labwork        Discussed plans with patient for ongoing care management follow up and provided patient with direct contact information for care management team;      Provided patient with written educational materials related to hypo and hyperglycemia and importance of correct treatment;       Reviewed scheduled/upcoming provider appointments including: 04-21-2022 at 10 am, reminder given today;         call provider for findings outside established parameters;       Review of patient status, including review of consultants reports, relevant laboratory and other test results, and medications completed;       Advised patient to discuss changes in his DM and questions or concerns with provider;      Screening for signs and symptoms of depression related to chronic disease state;        Assessed social determinant of health barriers;         Symptom Management: Take medications as prescribed   Attend all scheduled provider appointments Call provider office for new concerns or questions  call the Suicide and Crisis Lifeline: 988 call the [place] National Suicide Prevention Lifeline: 6801474976 or TTY: (402)143-0023 TTY 403 821 4775) to talk to a trained counselor call 1-800-273-TALK (toll free, 24 hour hotline) if experiencing a Mental Health or [place]  check feet daily for cuts, sores or  redness trim toenails straight across manage portion size wash and dry feet carefully every day wear comfortable, cotton socks wear comfortable, well-fitting shoes  Follow Up Plan: Telephone follow up appointment with care management team member scheduled for: 05-11-2022 at 1 pm       CCM Expected Outcome:  Monitor, Self-Manage and Reduce Symptoms of Heart Failure       Current Barriers:  Knowledge Deficits related to the importance of monitoring for changes in water weight and effective management of HF Chronic Disease Management support and education needs related to effective management of HF Lacks caregiver support.  Wt Readings from Last 3 Encounters:  04/09/22 239 lb (108.4 kg)  04/08/22 234 lb (106.1 kg)  04/03/22 245 lb (111.1 kg)     Planned Interventions: Basic overview and discussion of pathophysiology of Heart Failure reviewed Provided education on low sodium diet. The patient states he does not add salt to his foods. He uses a product called "no salt". The patient states that he is good about not eating foods high in sodium. Review and education provided Reviewed Heart Failure Action Plan in depth and provided written copy Assessed need for readable accurate scales in home. Discussed safety in weighing Provided education about placing scale on hard, flat surface Advised patient to weigh each morning after  emptying bladder. The patient currently has an indwelling foley catheter for recent urinary retention. The patient is having surgery on 05-07-2022 and hopes to not have to wear the catheter any longer.  Discussed importance of daily weight and advised patient to weigh and record daily. The patient is having fluctuations of weights at this time. The patient states he has been having urinary retention and that is why he had to go to the ER. He currently has an indwelling catheter.  Reviewed role of diuretics in prevention of fluid overload and management of heart failure. The  patient states he is taking his medications as prescribed  Discussed the importance of keeping all appointments with provider. The patient has a follow up with the pcp on 04-21-2022 for leg cramps and preop Provided patient with education about the role of exercise in the management of heart failure Advised patient to discuss changes in his heart failure or heart health with provider Screening for signs and symptoms of depression related to chronic disease state  Assessed social determinant of health barriers  Symptom Management: Take medications as prescribed   Attend all scheduled provider appointments Call provider office for new concerns or questions  call the Suicide and Crisis Lifeline: 988 call the [place] National Suicide Prevention Lifeline: (731)798-1431 or TTY: 317-620-0926 TTY 413-076-2764) to talk to a trained counselor call 1-800-273-TALK (toll free, 24 hour hotline) if experiencing a Mental Health or [place]  call office if I gain more than 2 pounds in one day or 5 pounds in one week keep legs up while sitting use salt in moderation watch for swelling in feet, ankles and legs every day weigh myself daily develop a rescue plan follow rescue plan if symptoms flare-up track symptoms and what helps feel better or worse dress right for the weather, hot or cold  Follow Up Plan: Telephone follow up appointment with care management team member scheduled for: 05-11-2022 at 1pm          Plan:Telephone follow up appointment with care management team member scheduled for:  05-11-2022 at 1 pm  [place], MSN, CCM RN Care Manager  Chronic Care Management Direct Number: [phone]

## 2022-04-20 NOTE — Patient Instructions (Signed)
Please call the care guide team at [phone] if you need to cancel or reschedule your appointment.   If you are experiencing a Mental Health or [place] or need someone to talk to, please call the Suicide and Crisis Lifeline: 988 call the [place] National Suicide Prevention Lifeline: 807-373-6030 or TTY: 308-322-0911 TTY 307-846-4018) to talk to a trained counselor call 1-800-273-TALK (toll free, 24 hour hotline)   Following is a copy of the CCM Program Consent:  CCM service includes personalized support from designated clinical staff supervised by the physician, including individualized plan of care and coordination with other care providers 24/7 contact phone numbers for assistance for urgent and routine care needs. Service will only be billed when office clinical staff spend 20 minutes or more in a month to coordinate care. Only one practitioner may furnish and bill the service in a calendar month. The patient may stop CCM services at amy time (effective at the end of the month) by phone call to the office staff. The patient will be responsible for cost sharing (co-pay) or up to 20% of the service fee (after annual deductible is met)  Following is a copy of your full provider care plan:   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   Planned Interventions: Provided patient with basic written and verbal COPD education on self  care/management/and exacerbation prevention. The patient denies any issues with his breathing. The patient states that his biggest concern now is urinary retention and the cramps he is having in his legs and feet. He will follow up with the pcp tomorrow. Advised patient to track and manage COPD triggers. Review of factors that cause exacerbation. The patient states his breathing is stable right now Provided written and verbal instructions on pursed lip breathing and utilized returned demonstration as teach back Provided instruction about proper use of medications used for management of COPD including inhalers. States compliance with medications.  Advised patient to self assesses COPD action plan zone and make appointment with provider if in the yellow zone for 48 hours without improvement Provided education about and advised patient to utilize infection prevention strategies to reduce risk of respiratory infection Discussed the importance of adequate rest and management of fatigue with COPD Screening for signs and symptoms of depression related to chronic disease state  Assessed social determinant of health barriers Pharm D referral for assistance with [place] cost constraints. The patient is working with the pharm D.   Symptom Management: Take medications as prescribed   Attend all scheduled provider appointments Call provider office for new concerns or questions  call the Suicide and Crisis Lifeline: 988 call the [place] National Suicide Prevention Lifeline: 262-402-0485 or TTY: 856 325 0192 TTY (207)874-1804) to talk to a trained counselor call 1-800-273-TALK (toll free, 24 hour hotline) if experiencing a Mental Health or [place]  avoid second hand smoke eliminate smoking in my home identify and  remove indoor air pollutants limit outdoor activity during cold weather listen for public air quality announcements every day develop a rescue plan eliminate symptom triggers at  home follow rescue plan if symptoms flare-up  Follow Up Plan: Telephone follow up appointment with care management team member scheduled for: 05-11-2022 at 1 pm       CCM Expected Outcome:  Monitor, Self-Manage and Reduce Symptoms of Diabetes       Current Barriers:  Knowledge Deficits related to the importance of good blood sugar control in a patient with DM Care Coordination needs related to ongoing education and support for management of medications and disease management  in a patient with DM Chronic Disease Management support and education needs related to effective management of DM Lacks caregiver support.   Lab Results  Component Value Date   HGBA1C 6.3 (H) 02/04/2022     Planned Interventions: Provided education to patient about basic DM disease process; Reviewed medications with patient and discussed importance of medication.  adherence. The patient is now using pill packs and he says this is working out well for him. Encouraged him to take his medications as prescribed and call for any questions or concerns. Works with the [employer]. Education and support given;        Reviewed prescribed diet with patient heart healthy/ADA diet. The patient states compliance with heart healthy/Ada diet. Education given on monitoring for hidden sodium; Counseled on importance of regular laboratory monitoring as prescribed. Has regular labwork        Discussed plans with patient for ongoing care management follow up and provided patient with direct contact information for care management team;      Provided patient with written educational materials related to hypo and hyperglycemia and importance of correct treatment;       Reviewed scheduled/upcoming provider appointments including: 04-21-2022 at 10 am, reminder given today;         call provider for findings outside established parameters;       Review of patient status, including review of consultants reports, relevant laboratory and other test  results, and medications completed;       Advised patient to discuss changes in his DM and questions or concerns with provider;      Screening for signs and symptoms of depression related to chronic disease state;        Assessed social determinant of health barriers;         Symptom Management: Take medications as prescribed   Attend all scheduled provider appointments Call provider office for new concerns or questions  call the Suicide and Crisis Lifeline: 988 call the [place] National Suicide Prevention Lifeline: 909-503-6341 or TTY: 325 554 9836 TTY 561-434-2277) to talk to a trained counselor call 1-800-273-TALK (toll free, 24 hour hotline) if experiencing a Mental Health or [place]  check feet daily for cuts, sores or redness trim toenails straight across manage portion size wash and dry feet carefully every day wear comfortable, cotton socks wear comfortable, well-fitting shoes  Follow Up Plan: Telephone follow up appointment with care management team member scheduled for: 05-11-2022 at 1 pm       CCM Expected Outcome:  Monitor, Self-Manage and Reduce Symptoms of Heart Failure       Current Barriers:  Knowledge Deficits related to the importance of monitoring for changes in water weight and effective management of HF Chronic Disease Management support and education needs related to effective management of HF Lacks caregiver support.  Wt Readings from  Last 3 Encounters:  04/09/22 239 lb (108.4 kg)  04/08/22 234 lb (106.1 kg)  04/03/22 245 lb (111.1 kg)     Planned Interventions: Basic overview and discussion of pathophysiology of Heart Failure reviewed Provided education on low sodium diet. The patient states he does not add salt to his foods. He uses a product called "no salt". The patient states that he is good about not eating foods high in sodium. Review and education provided Reviewed Heart Failure Action Plan in depth and provided written copy Assessed  need for readable accurate scales in home. Discussed safety in weighing Provided education about placing scale on hard, flat surface Advised patient to weigh each morning after emptying bladder. The patient currently has an indwelling foley catheter for recent urinary retention. The patient is having surgery on 05-07-2022 and hopes to not have to wear the catheter any longer.  Discussed importance of daily weight and advised patient to weigh and record daily. The patient is having fluctuations of weights at this time. The patient states he has been having urinary retention and that is why he had to go to the ER. He currently has an indwelling catheter.  Reviewed role of diuretics in prevention of fluid overload and management of heart failure. The patient states he is taking his medications as prescribed  Discussed the importance of keeping all appointments with provider. The patient has a follow up with the pcp on 04-21-2022 for leg cramps and preop Provided patient with education about the role of exercise in the management of heart failure Advised patient to discuss changes in his heart failure or heart health with provider Screening for signs and symptoms of depression related to chronic disease state  Assessed social determinant of health barriers  Symptom Management: Take medications as prescribed   Attend all scheduled provider appointments Call provider office for new concerns or questions  call the Suicide and Crisis Lifeline: 988 call the [place] National Suicide Prevention Lifeline: 251-663-8324 or TTY: (212)773-3686 TTY 581-453-1446) to talk to a trained counselor call 1-800-273-TALK (toll free, 24 hour hotline) if experiencing a Mental Health or [place]  call office if I gain more than 2 pounds in one day or 5 pounds in one week keep legs up while sitting use salt in moderation watch for swelling in feet, ankles and legs every day weigh myself daily develop a rescue  plan follow rescue plan if symptoms flare-up track symptoms and what helps feel better or worse dress right for the weather, hot or cold  Follow Up Plan: Telephone follow up appointment with care management team member scheduled for: 05-11-2022 at 1pm          The patient verbalized understanding of instructions, educational materials, and care plan provided today and DECLINED offer to receive copy of patient instructions, educational materials, and care plan.   Telephone follow up appointment with care management team member scheduled for: 05-11-2022 at 1 pm

## 2022-04-21 ENCOUNTER — Ambulatory Visit (INDEPENDENT_AMBULATORY_CARE_PROVIDER_SITE_OTHER): Payer: PPO | Admitting: Family Medicine

## 2022-04-21 ENCOUNTER — Encounter: Payer: Self-pay | Admitting: Family Medicine

## 2022-04-21 VITALS — BP 189/80 | HR 80 | Temp 98.2°F | Ht 71.0 in | Wt 252.4 lb

## 2022-04-21 DIAGNOSIS — I129 Hypertensive chronic kidney disease with stage 1 through stage 4 chronic kidney disease, or unspecified chronic kidney disease: Secondary | ICD-10-CM

## 2022-04-21 DIAGNOSIS — E1122 Type 2 diabetes mellitus with diabetic chronic kidney disease: Secondary | ICD-10-CM

## 2022-04-21 DIAGNOSIS — R252 Cramp and spasm: Secondary | ICD-10-CM | POA: Diagnosis not present

## 2022-04-21 MED ORDER — RYBELSUS 7 MG PO TABS: 7.0000 mg | ORAL_TABLET | Freq: Every day | ORAL | 3 refills | Status: DC

## 2022-04-21 MED ORDER — DICLOFENAC SODIUM 1 % EX GEL: 4.0000 g | Freq: Four times a day (QID) | CUTANEOUS | 3 refills | Status: DC

## 2022-04-21 NOTE — Progress Notes (Signed)
BP (!) 189/80   Pulse 80   Temp 98.2 F (36.8 C) (Oral)   Ht '5\' 11"'$  (1.803 m)   Wt 252 lb 6.4 oz (114.5 kg)   SpO2 97%   BMI 35.20 kg/m    Subjective:    Patient ID: Justin Griffith, male    DOB: 1940-06-01, 82 y.o.   MRN: [id number]  HPI: Justin Griffith is a 82 y.o. male  Chief Complaint  Patient presents with   Leg Problem    Patient says he started to having where his legs would just starting jumping like nerves and then he noticed some cramping in the back of his legs. Patient says he thinks it may something with his nerves.    HYPERTENSION- has not started higher dose of his hydralazine as he hasn't gotten his new pill pack yet- due to get it next week.   Hypertension status: uncontrolled  Satisfied with current treatment? yes Duration of hypertension: chronic BP monitoring frequency:  a few times a week BP medication side effects:  no Medication compliance: good compliance Previous BP meds: hydralazine, clonidine, losartan, torsemide Aspirin: no Recurrent headaches: no Visual changes: no Palpitations: no Dyspnea: no Chest pain: no Lower extremity edema: no Dizzy/lightheaded: no  LEG CRAMPS Duration: about a wekk Pain: yes Severity: moderate  Quality:  cramps Location:  calves Bilateral:  yes Onset: sudden Frequency: intermittent Time of  day:   at night Sudden unintentional leg jerking:   no Paresthesias:   no Decreased sensation:  no Weakness:   no Insomnia:   no Fatigue:   no Status: worse  DIABETES- tolerating his rybelsus well. No diarrhea Hypoglycemic episodes:no Polydipsia/polyuria: no Visual disturbance: no Chest pain: no Paresthesias: no Glucose Monitoring: no  Accucheck frequency: Not Checking Taking Insulin?: no Blood Pressure Monitoring: not checking Retinal Examination: Not up to Date Foot Exam: Not up to Date Diabetic Education: Completed Pneumovax: Up to Date Influenza: Up to Date Aspirin: no   Relevant past medical,  surgical, family and social history reviewed and updated as indicated. Interim medical history since our last visit reviewed. Allergies and medications reviewed and updated.  Review of Systems  Constitutional: Negative.   Respiratory: Negative.    Cardiovascular: Negative.   Gastrointestinal: Negative.   Musculoskeletal:  Positive for myalgias. Negative for arthralgias, back pain, gait problem, joint swelling, neck pain and neck stiffness.  Skin: Negative.   Psychiatric/Behavioral: Negative.      Per HPI unless specifically indicated above     Objective:    BP (!) 189/80   Pulse 80   Temp 98.2 F (36.8 C) (Oral)   Ht '5\' 11"'$  (1.803 m)   Wt 252 lb 6.4 oz (114.5 kg)   SpO2 97%   BMI 35.20 kg/m   Wt Readings from Last 3 Encounters:  04/21/22 252 lb 6.4 oz (114.5 kg)  04/09/22 239 lb (108.4 kg)  04/08/22 234 lb (106.1 kg)    Physical Exam Vitals and nursing note reviewed.  Constitutional:      General: He is not in acute distress.    Appearance: Normal appearance. He is not ill-appearing, toxic-appearing or diaphoretic.  HENT:     Head: Normocephalic and atraumatic.     Right Ear: External ear normal.     Left Ear: External ear normal.     Nose: Nose normal.     Mouth/Throat:     Mouth: Mucous membranes are moist.     Pharynx: Oropharynx is clear.  Eyes:  General: No scleral icterus.       Right eye: No discharge.        Left eye: No discharge.     Extraocular Movements: Extraocular movements intact.     Conjunctiva/sclera: Conjunctivae normal.     Pupils: Pupils are equal, round, and reactive to light.  Cardiovascular:     Rate and Rhythm: Normal rate and regular rhythm.     Pulses: Normal pulses.     Heart sounds: Murmur heard.     No friction rub. No gallop.  Pulmonary:     Effort: Pulmonary effort is normal. No respiratory distress.     Breath sounds: Normal breath sounds. No stridor. No wheezing, rhonchi or rales.  Chest:     Chest wall: No tenderness.   Musculoskeletal:        General: Normal range of motion.     Cervical back: Normal range of motion and neck supple.  Skin:    General: Skin is warm and dry.     Capillary Refill: Capillary refill takes less than 2 seconds.     Coloration: Skin is not jaundiced or pale.     Findings: No bruising, erythema, lesion or rash.  Neurological:     General: No focal deficit present.     Mental Status: He is alert and oriented to person, place, and time. Mental status is at baseline.  Psychiatric:        Mood and Affect: Mood normal.        Behavior: Behavior normal.        Thought Content: Thought content normal.        Judgment: Judgment normal.     Results for orders placed or performed in visit on 04/08/22  Beta-2-glycoprotein i abs, IgG/M/A  Result Value Ref Range   Beta-2 Glyco I IgG <9 0 - 20 GPI IgG units   Beta-2-Glycoprotein I IgM <9 0 - 32 GPI IgM units   Beta-2-Glycoprotein I IgA <9 0 - 25 GPI IgA units      Assessment & Plan:   Problem List Items Addressed This Visit       Endocrine   Type II diabetes mellitus with renal manifestations ([place]) - Primary    Tolerating his rybelsus well. Will increase him when he finishes the sample to '7mg'$ . Continue diet and exercise. Call with any concerns.       Relevant Medications   Semaglutide (RYBELSUS) 7 MG TABS     Genitourinary   Benign hypertensive renal disease    Has not started higher dose of hydralazine. Picking it up on Thursday. Recheck next week as scheduled.       Other Visit Diagnoses     Leg cramps       Will check labs to see electrolytes. Will treat with voltaren gel. Await results. Treat as needed.   Relevant Orders   Basic metabolic panel   Magnesium   Phosphorus        Follow up plan: Return as scheduled.

## 2022-04-21 NOTE — Assessment & Plan Note (Signed)
Tolerating his rybelsus well. Will increase him when he finishes the sample to '7mg'$ . Continue diet and exercise. Call with any concerns.

## 2022-04-21 NOTE — Assessment & Plan Note (Signed)
Has not started higher dose of hydralazine. Picking it up on Thursday. Recheck next week as scheduled.

## 2022-04-22 ENCOUNTER — Other Ambulatory Visit: Payer: PPO | Admitting: Pharmacist

## 2022-04-22 DIAGNOSIS — E1159 Type 2 diabetes mellitus with other circulatory complications: Secondary | ICD-10-CM

## 2022-04-22 DIAGNOSIS — I5030 Unspecified diastolic (congestive) heart failure: Secondary | ICD-10-CM

## 2022-04-22 DIAGNOSIS — I110 Hypertensive heart disease with heart failure: Secondary | ICD-10-CM

## 2022-04-22 LAB — BASIC METABOLIC PANEL
BUN/Creatinine Ratio: 14 (ref 10–24)
BUN: 19 mg/dL (ref 8–27)
CO2: 25 mmol/L (ref 20–29)
Calcium: 9.5 mg/dL (ref 8.6–10.2)
Chloride: 95 mmol/L — ABNORMAL LOW (ref 96–106)
Creatinine, Ser: 1.32 mg/dL — ABNORMAL HIGH (ref 0.76–1.27)
Glucose: 276 mg/dL — ABNORMAL HIGH (ref 70–99)
Potassium: 4.2 mmol/L (ref 3.5–5.2)
Sodium: 134 mmol/L (ref 134–144)
eGFR: 54 mL/min/{1.73_m2} — ABNORMAL LOW (ref >59)

## 2022-04-22 LAB — PHOSPHORUS: Phosphorus: 2.7 mg/dL — ABNORMAL LOW (ref 2.8–4.1)

## 2022-04-22 LAB — MAGNESIUM: Magnesium: 1.7 mg/dL (ref 1.6–2.3)

## 2022-04-22 NOTE — Progress Notes (Signed)
04/22/2022 Name: Justin Griffith MRN: [id number] DOB: 20-Mar-1941  Chief Complaint  Patient presents with   Medication Assistance    Justin Griffith is a 82 y.o. year old male who presented for a telephone visit.   They were referred to the pharmacist by their Case Management Team  for assistance in managing medication access.   Subjective:  Care Team: Primary Care Provider: Valerie Roys, DO ; Next Scheduled Visit: 04/30/2022 Cardiologist: Jobe Gibbon; Next Scheduled Visit: 06/18/2022  Medication Access/Adherence  Current Pharmacy:  [facility], [place] [address] Phone: [phone] Fax: [facility], [address] [place] [address] Phone: [phone] Fax: [phone]  [id number] [place] [address] [place]: [phone]: [phone]  Patient reports affordability concerns with their medications: Yes  Patient reports access/transportation concerns to their pharmacy: No  Patient reports adherence concerns with their medications:  No     Received referral from CCM Nurse Case Manager for financial assistance related to patient's inhaler therapy. Note previously outreached to patient regarding this referral on 12/202/2023 and at that time patient stated that he was unsure if he would like my assistance with applying for patient assistance program and preferred that I call him back in January.  When I reach patient again today, he reports that he is currently not taking Incruse due to the cost, but also expresses feeling like he is not sure if he needs has need for the inhaler. Patient declines my help with applying for medication assistance at this time and states that he will instead discuss with his PCP.   Objective:  Lab Results  Component Value Date   CREATININE 1.32 (H) 04/21/2022   BUN 19  04/21/2022   NA 134 04/21/2022   K 4.2 04/21/2022   CL 95 (L) 04/21/2022   CO2 25 04/21/2022    Medications    Reviewed by JValerie Roys DO (Physician) on 04/21/22 at 1029  Med List Status: <None>   Medication Order Taking? Sig Documenting Provider Last Dose Status Informant  acyclovir (ZOVIRAX) 400 MG tablet [id number] Take 1 tablet (400 mg total) by mouth 2 (two) times daily. JWynetta Emery Megan P, DO Taking Active   apixaban (ELIQUIS) 2.5 MG TABS tablet [id number] Take 1 tablet (2.5 mg total) by mouth 2 (two) times daily. For blood thinner JPark LiterP, DO Taking Active   blood glucose meter kit and supplies [id number] 1 each by Other route daily. Dispense based on patient and insurance preference. Use up to four times daily as directed. (FOR ICD-10 E10.9, E11.9). JWynetta Emery Megan P, DO Taking Active   cephALEXin (KEFLEX) 500 MG capsule [id number] Take 2 capsules (1,000 mg total) by mouth 2 (two) times daily. Cuthriell, JCharline Bills PA-C Taking Active   cloNIDine (CATAPRES) 0.1 MG tablet [id number] Take 1 tablet (0.1 mg total) by mouth 2 (two) times daily. For blood pressure Johnson, Megan P, DO Taking Active   gabapentin (NEURONTIN) 300 MG capsule [id number] Take 1 capsule (300 mg total) by mouth 3 (three) times daily. For nerve pain and itching JValerie Roys DO Taking Active   GNP VITAMIN C 500 MG tablet [id number] TAKE 1 TABLET BY MOUTH ONCE DAILY TO KEEP YOUR IMMUNE SYSTEM UP JPark Liter  P, DO Taking Active   hydrALAZINE (APRESOLINE) 50 MG tablet [id number] Yes Take 1.5 tablets (75 mg total) by mouth 3 (three) times daily. for blood pressure Johnson, Megan P, DO Taking Active   losartan (COZAAR) 100 MG tablet [id number] Yes Take 1 tablet (100 mg total) by mouth daily. For blood pressure Johnson, Megan P, DO Taking Active   Multiple Vitamin (MULTIVITAMIN WITH MINERALS) TABS tablet [id number] Yes Take 1 tablet by mouth daily. For nutrition Valerie Roys, DO  Taking Active   nystatin (MYCOSTATIN/NYSTOP) powder [id number] Yes Apply 1 Application topically 3 (three) times daily. Wynetta Emery, Megan P, DO Taking Active   Olopatadine HCl 0.2 % SOLN [id number] Yes Apply 1 drop to eye daily. Wynetta Emery, Megan P, DO Taking Active   omeprazole (PRILOSEC) 40 MG capsule [id number] Yes Take 1 capsule (40 mg total) by mouth daily. For heartburn [place] Liter P, DO Taking Active   polyethylene glycol powder (GLYCOLAX/MIRALAX) 17 GM/SCOOP powder [id number] Yes Take 17 g by mouth 2 (two) times daily as needed. Johnson, Megan P, DO Taking Active   rosuvastatin (CRESTOR) 10 MG tablet [id number] Yes Take 1 tablet (10 mg total) by mouth at bedtime. For cholesterol Johnson, Megan P, DO Taking Active   Semaglutide (RYBELSUS) 3 MG TABS [id number] Yes Take 3 mg by mouth daily. Johnson, Megan P, DO Taking Active   Semaglutide (RYBELSUS) 7 MG TABS [id number] Yes Take 1 tablet (7 mg total) by mouth daily. Johnson, Megan P, DO  Active   sodium hypochlorite (DAKIN'S 1/2 STRENGTH) external solution [id number] Yes Apply 1 Application topically daily at 12 noon. Topically to wound daily to clean. [provider] Taking Active   Torsemide 40 MG TABS [id number] Yes Take by mouth. [provider] Taking Active   umeclidinium bromide (INCRUSE ELLIPTA) 62.5 MCG/ACT AEPB [id number] Yes Inhale 1 puff into the lungs daily. For your breathing Marnee Guarneri T, NP Taking Active   Zinc Oxide 10 % OINT [id number] Yes Apply topically. [provider] Taking Active   Med List Note Justin Griffith, CPhT 12/14/19 1738): [place] [phone]              Assessment/Plan:   - Counsel patient on rational for using maintenance inhaler for control of COPD symptoms - Again attempt to review with patient medication assistance options for inhaler therapy Note patient previously using Incruse inhaler - 1 puff daily as prescribed by PCP office, but per notes from [place].  [name], has also trialed Breztri 2 puffs bid from sample Today patient declines to discuss medication assistance further and declines needs from Clinical Pharmacist -Will collaborate with PCP and CCM Nurse Case Manager to provide an update  Follow Up Plan:  Patient denies further medication questions or concerns today Provide patient with contact information for clinic pharmacist to contact if needed in future for medication questions/concerns   Wallace Cullens, PharmD, [facility] [phone]

## 2022-04-22 NOTE — Patient Instructions (Addendum)
Please follow up with Dr. Wynetta Emery as scheduled for Office Visit on 04/30/2022 2:20 PM (Arrive by 2:05 PM)  Thank you,  Wallace Cullens, PharmD, [place] [phone]

## 2022-04-28 ENCOUNTER — Telehealth: Payer: Self-pay | Admitting: Family Medicine

## 2022-04-28 NOTE — Telephone Encounter (Signed)
Copied from [place]. Topic: General - Inquiry >> Apr 28, 2022  1:09 PM Erskine Squibb wrote: Reason for CRM: Justin Griffith with [facility] called to inform the patients provider he fell when his walker got caught on the couch. He hit his head and the ambulance was called to check him out. He did not want to go to the hospital stating he just wants to see his provider on Thursday. Please assist patient further if necessary

## 2022-04-28 NOTE — Telephone Encounter (Signed)
Called and spoke to patient. Did not lose consciousness. No changes in his vision. No black eye or bump on is head. OK to wait until Thursday to be seen.

## 2022-04-29 DIAGNOSIS — I509 Heart failure, unspecified: Secondary | ICD-10-CM | POA: Diagnosis not present

## 2022-04-29 DIAGNOSIS — I69351 Hemiplegia and hemiparesis following cerebral infarction affecting right dominant side: Secondary | ICD-10-CM | POA: Diagnosis not present

## 2022-04-29 DIAGNOSIS — J449 Chronic obstructive pulmonary disease, unspecified: Secondary | ICD-10-CM | POA: Diagnosis not present

## 2022-04-30 ENCOUNTER — Ambulatory Visit: Payer: PPO | Admitting: Family Medicine

## 2022-04-30 ENCOUNTER — Ambulatory Visit (INDEPENDENT_AMBULATORY_CARE_PROVIDER_SITE_OTHER): Payer: PPO | Admitting: Family Medicine

## 2022-04-30 ENCOUNTER — Encounter: Payer: Self-pay | Admitting: Family Medicine

## 2022-04-30 VITALS — BP 173/82 | HR 99 | Temp 98.6°F | Ht 71.0 in | Wt 251.1 lb

## 2022-04-30 DIAGNOSIS — I129 Hypertensive chronic kidney disease with stage 1 through stage 4 chronic kidney disease, or unspecified chronic kidney disease: Secondary | ICD-10-CM

## 2022-04-30 MED ORDER — HYDRALAZINE HCL 100 MG PO TABS: 100.0000 mg | ORAL_TABLET | Freq: Three times a day (TID) | ORAL | 1 refills | Status: DC

## 2022-04-30 NOTE — Progress Notes (Signed)
BP (!) 173/82   Pulse 99   Temp 98.6 F (37 C) (Oral)   Ht 5' 11"$  (1.803 m)   Wt 251 lb 1.6 oz (113.9 kg)   SpO2 95%   BMI 35.02 kg/m    Subjective:    Patient ID: Justin Griffith, male    DOB: 17-Jun-1940, 82 y.o.   MRN: [id number]  HPI: Justin Griffith is a 82 y.o. male  Chief Complaint  Patient presents with   Diabetes   Hypertension   HYPERTENSION  Hypertension status: uncontrolled  Satisfied with current treatment? no Duration of hypertension: chronic BP monitoring frequency:  a few times a week BP medication side effects:  no Medication compliance: good compliance Aspirin: no Recurrent headaches: no Visual changes: no Palpitations: no Dyspnea: no Chest pain: no Lower extremity edema: no Dizzy/lightheaded: no  Relevant past medical, surgical, family and social history reviewed and updated as indicated. Interim medical history since our last visit reviewed. Allergies and medications reviewed and updated.  Review of Systems  Constitutional: Negative.   Respiratory: Negative.    Cardiovascular: Negative.   Gastrointestinal: Negative.   Musculoskeletal: Negative.   Neurological: Negative.   Psychiatric/Behavioral: Negative.      Per HPI unless specifically indicated above     Objective:    BP (!) 173/82   Pulse 99   Temp 98.6 F (37 C) (Oral)   Ht 5' 11"$  (1.803 m)   Wt 251 lb 1.6 oz (113.9 kg)   SpO2 95%   BMI 35.02 kg/m   Wt Readings from Last 3 Encounters:  04/30/22 251 lb 1.6 oz (113.9 kg)  04/21/22 252 lb 6.4 oz (114.5 kg)  04/09/22 239 lb (108.4 kg)    Physical Exam Vitals and nursing note reviewed.  Constitutional:      General: He is not in acute distress.    Appearance: Normal appearance. He is not ill-appearing, toxic-appearing or diaphoretic.  HENT:     Head: Normocephalic and atraumatic.     Right Ear: External ear normal.     Left Ear: External ear normal.     Nose: Nose normal.     Mouth/Throat:     Mouth: Mucous membranes  are moist.     Pharynx: Oropharynx is clear.  Eyes:     General: No scleral icterus.       Right eye: No discharge.        Left eye: No discharge.     Extraocular Movements: Extraocular movements intact.     Conjunctiva/sclera: Conjunctivae normal.     Pupils: Pupils are equal, round, and reactive to light.  Cardiovascular:     Rate and Rhythm: Normal rate and regular rhythm.     Pulses: Normal pulses.     Heart sounds: Normal heart sounds. No murmur heard.    No friction rub. No gallop.  Pulmonary:     Effort: Pulmonary effort is normal. No respiratory distress.     Breath sounds: Normal breath sounds. No stridor. No wheezing, rhonchi or rales.  Chest:     Chest wall: No tenderness.  Musculoskeletal:        General: Normal range of motion.     Cervical back: Normal range of motion and neck supple.  Skin:    General: Skin is warm and dry.     Capillary Refill: Capillary refill takes less than 2 seconds.     Coloration: Skin is not jaundiced or pale.     Findings: No bruising, erythema,  lesion or rash.  Neurological:     General: No focal deficit present.     Mental Status: He is alert and oriented to person, place, and time. Mental status is at baseline.  Psychiatric:        Mood and Affect: Mood normal.        Behavior: Behavior normal.        Thought Content: Thought content normal.        Judgment: Judgment normal.     Results for orders placed or performed in visit on [id number]  Basic metabolic panel  Result Value Ref Range   Glucose 276 (H) 70 - 99 mg/dL   BUN 19 8 - 27 mg/dL   Creatinine, Ser 1.32 (H) 0.76 - 1.27 mg/dL   eGFR 54 (L) >59 mL/min/1.73   BUN/Creatinine Ratio 14 10 - 24   Sodium 134 134 - 144 mmol/L   Potassium 4.2 3.5 - 5.2 mmol/L   Chloride 95 (L) 96 - 106 mmol/L   CO2 25 20 - 29 mmol/L   Calcium 9.5 8.6 - 10.2 mg/dL  Magnesium  Result Value Ref Range   Magnesium 1.7 1.6 - 2.3 mg/dL  Phosphorus  Result Value Ref Range   Phosphorus 2.7 (L) 2.8  - 4.1 mg/dL      Assessment & Plan:   Problem List Items Addressed This Visit       Genitourinary   Benign hypertensive renal disease - Primary    Still running a little high at home. Will increase his hydralazine to 158m TID with next pill pack (05/22/22). Recheck in about 5 weeks. Call with any concerns.         Follow up plan: Return in about 2 weeks (around 05/14/2022).

## 2022-05-01 ENCOUNTER — Ambulatory Visit: Payer: Self-pay

## 2022-05-01 NOTE — Telephone Encounter (Signed)
  Chief Complaint: fall Symptoms: fall x 2 last night, skin tear R arm, R knee bruise and buckling  Frequency: last night  Pertinent Negatives: NA Disposition: '[]'$ ED /'[]'$ Urgent Care (no appt availability in office) / '[]'$ Appointment(In office/virtual)/ '[]'$  [place] [facility]/ '[x]'$ Home Care/ '[]'$ Refused Recommended Disposition /'[]'$ [place] [facility]/ '[]'$  Follow-up with PCP Additional Notes: EMS was called for 1 fall and dressed bandage to R arm skin tear. Pt also weak today and R knee buckles at times, Raquel Sarna, PT states that pt has good ROM but has a lot of flaccidity in R knee. Offered to schedule pt appt for FU on falls and pt refused. Says he will FU after his surgery on 05/07/22. Raquel Sarna, PT did given pt fall prevention education. She is also requesting extended PT 2x/week x 4 weeks. Advised I would send to provider for review. CB [phone]   Reason for Disposition  Small cut (scratch) or abrasion (scrape) is also present  Answer Assessment - Initial Assessment Questions 1. MECHANISM: "How did the fall happen?"     R knee buckled  3. ONSET: "When did the fall happen?" (e.g., minutes, hours, or days ago)     Last night  4. LOCATION: "What part of the body hit the ground?" (e.g., back, buttocks, head, hips, knees, hands, head, stomach)     R knee and R arm  5. INJURY: "Did you hurt (injure) yourself when you fell?" If Yes, ask: "What did you injure? Tell me more about this?" (e.g., body area; type of injury; pain severity)"     R arm skin tear  6. PAIN: "Is there any pain?" If Yes, ask: "How bad is the pain?" (e.g., Scale 1-10; or mild,  moderate, severe)   - NONE (0): No pain   - MILD (1-3): Doesn't interfere with normal activities    - MODERATE (4-7): Interferes with normal activities or awakens from sleep    - SEVERE (8-10): Excruciating pain, unable to do any normal activities      2 7. SIZE: For cuts, bruises, or swelling, ask: "How large is it?" (e.g., inches or centimeters)      Has  dressing on there from EMS 9. OTHER SYMPTOMS: "Do you have any other symptoms?" (e.g., dizziness, fever, weakness; new onset or worsening).      Fatigue  Protocols used: Falls and [facility]

## 2022-05-03 ENCOUNTER — Encounter: Payer: Self-pay | Admitting: Family Medicine

## 2022-05-03 NOTE — Assessment & Plan Note (Signed)
Still running a little high at home. Will increase his hydralazine to 145m TID with next pill pack (05/22/22). Recheck in about 5 weeks. Call with any concerns.

## 2022-05-05 ENCOUNTER — Other Ambulatory Visit: Payer: Self-pay | Admitting: Radiology

## 2022-05-05 DIAGNOSIS — N401 Enlarged prostate with lower urinary tract symptoms: Secondary | ICD-10-CM

## 2022-05-06 ENCOUNTER — Telehealth: Payer: Self-pay | Admitting: Family Medicine

## 2022-05-06 NOTE — H&P (Signed)
Chief Complaint: Patient was seen in consultation today for benign prostatic hyperplasia; prostate artery embolization.   Referring Physician(s): Dr. Nickolas Madrid  Supervising Physician: {Supervising Physician:[id number]}  Patient Status: {IR Consult Patient Status:21879}  History of Present Illness: Justin Griffith is a 82 y.o. male with a medical history significant for COPD, DM2, colon cancer (s/p resection), MI (s/p DES placement), stroke and acute urinary retention secondary to benign prostatic hyperplasia. He is catheter dependent.   Past Medical History:  Diagnosis Date   Acute urinary retention    Arthritis    Asthma 2000   Back pain    Colon polyp 2012   COPD (chronic obstructive pulmonary disease) (HCC)    Emphysema of lung (HCC)    Heart murmur    Hernia 2000   History of cold sores 05/06/2020   History of colon cancer    Hyperlipidemia    Hypertension    Personal history of malignant neoplasm of large intestine    Personal history of tobacco use, presenting hazards to health    Septic shock ([place]) 11/18/2021   Sleep apnea    Special screening for malignant neoplasms, colon    Type 2 diabetes mellitus with proteinuria ([place]) 10/11/2014    Past Surgical History:  Procedure Laterality Date   CARDIAC CATHETERIZATION Left 09/25/2015   Procedure: Left Heart Cath and Coronary Angiography;  Surgeon: Yolonda Kida, MD;  Location: [place] [facility];  Service: Cardiovascular;  Laterality: Left;   CHOLECYSTECTOMY  1970   COLON SURGERY  07-16-99   sigmoid colon resection with primary anastomosis, chemotherapy for metastatic disease   COLONOSCOPY  2001, 2012   Dr Bary Castilla, tubular adenoma of the cecum and ascending colon in 2012.   COLONOSCOPY WITH PROPOFOL N/A 02/12/2016   Procedure: COLONOSCOPY WITH PROPOFOL;  Surgeon: Justin Bellow, MD;  Location: [facility];  Service: Endoscopy;  Laterality: N/A;   CORONARY STENT INTERVENTION N/A 05/17/2020   Procedure:  CORONARY STENT INTERVENTION;  Surgeon: Yolonda Kida, MD;  Location: [place] [facility];  Service: Cardiovascular;  Laterality: N/A;  LAD   ESOPHAGOGASTRODUODENOSCOPY (EGD) WITH PROPOFOL N/A 02/12/2016   Procedure: ESOPHAGOGASTRODUODENOSCOPY (EGD) WITH PROPOFOL;  Surgeon: Justin Bellow, MD;  Location: [facility] ENDOSCOPY;  Service: Endoscopy;  Laterality: N/A;   HERNIA REPAIR Right    right inguinal hernia repair   IR RADIOLOGIST EVAL & MGMT  02/27/2022   IR RADIOLOGIST EVAL & MGMT  03/25/2022   JOINT REPLACEMENT Bilateral    knee replacement   KNEE SURGERY Bilateral 2010   LEFT HEART CATH AND CORONARY ANGIOGRAPHY N/A 05/17/2020   Procedure: LEFT HEART CATH AND CORONARY ANGIOGRAPHY possible PCI and stent;  Surgeon: Yolonda Kida, MD;  Location: [place] [facility];  Service: Cardiovascular;  Laterality: N/A;   MYRINGOTOMY WITH TUBE PLACEMENT Bilateral 08/16/2014   Procedure: MYRINGOTOMY WITH TUBE PLACEMENT;  Surgeon: Carloyn Manner, MD;  Location: [facility];  Service: ENT;  Laterality: Bilateral;   PERIPHERAL VASCULAR CATHETERIZATION Right 09/04/2015   Procedure: Lower Extremity Angiography;  Surgeon: Katha Cabal, MD;  Location: [place] [facility];  Service: Cardiovascular;  Laterality: Right;   PERIPHERAL VASCULAR CATHETERIZATION  09/04/2015   Procedure: Lower Extremity Intervention;  Surgeon: Katha Cabal, MD;  Location: [place] [facility];  Service: Cardiovascular;;   TONSILLECTOMY     TYMPANOSTOMY TUBE PLACEMENT      Allergies: Farxiga [dapagliflozin], Dulaglutide, Liraglutide, and Jardiance [empagliflozin]  Medications: Prior to Admission medications   Medication Sig Start  Date End Date Taking? Authorizing Provider  acyclovir (ZOVIRAX) 400 MG tablet Take 1 tablet (400 mg total) by mouth 2 (two) times daily. 03/26/22   Johnson, Megan P, DO  apixaban (ELIQUIS) 2.5 MG TABS tablet Take 1 tablet (2.5 mg total) by mouth 2 (two) times daily. For blood thinner 04/09/22    Johnson, Megan P, DO  blood glucose meter kit and supplies 1 each by Other route daily. Dispense based on patient and insurance preference. Use up to four times daily as directed. (FOR ICD-10 E10.9, E11.9). 02/10/22   Johnson, Megan P, DO  cloNIDine (CATAPRES) 0.1 MG tablet Take 1 tablet (0.1 mg total) by mouth 2 (two) times daily. For blood pressure 04/09/22   Johnson, Megan P, DO  diclofenac Sodium (VOLTAREN) 1 % GEL Apply 4 g topically 4 (four) times daily. 04/21/22   Johnson, Megan P, DO  gabapentin (NEURONTIN) 300 MG capsule Take 1 capsule (300 mg total) by mouth 3 (three) times daily. For nerve pain and itching 04/09/22   Johnson, Megan P, DO  GNP VITAMIN C 500 MG tablet TAKE 1 TABLET BY MOUTH ONCE DAILY TO KEEP YOUR IMMUNE SYSTEM UP 04/17/22   [place] P, DO  hydrALAZINE (APRESOLINE) 100 MG tablet Take 1 tablet (100 mg total) by mouth 3 (three) times daily. for blood pressure 04/30/22   Johnson, Megan P, DO  losartan (COZAAR) 100 MG tablet Take 1 tablet (100 mg total) by mouth daily. For blood pressure 04/09/22   Johnson, Megan P, DO  Multiple Vitamin (MULTIVITAMIN WITH MINERALS) TABS tablet Take 1 tablet by mouth daily. For nutrition 02/23/22   [place] P, DO  nystatin (MYCOSTATIN/NYSTOP) powder Apply 1 Application topically 3 (three) times daily. 04/03/22   Johnson, Megan P, DO  Olopatadine HCl 0.2 % SOLN Apply 1 drop to eye daily. 04/09/22   Johnson, Megan P, DO  omeprazole (PRILOSEC) 40 MG capsule Take 1 capsule (40 mg total) by mouth daily. For heartburn 04/09/22   Johnson, Megan P, DO  polyethylene glycol powder (GLYCOLAX/MIRALAX) 17 GM/SCOOP powder Take 17 g by mouth 2 (two) times daily as needed. 03/02/22   Johnson, Megan P, DO  rosuvastatin (CRESTOR) 10 MG tablet Take 1 tablet (10 mg total) by mouth at bedtime. For cholesterol 04/09/22   Johnson, Megan P, DO  Semaglutide (RYBELSUS) 3 MG TABS Take 3 mg by mouth daily. 04/03/22   Johnson, Megan P, DO  Semaglutide (RYBELSUS) 7 MG TABS Take  1 tablet (7 mg total) by mouth daily. 04/21/22   Johnson, Megan P, DO  sodium hypochlorite (DAKIN'S 1/2 STRENGTH) external solution Apply 1 Application topically daily at 12 noon. Topically to wound daily to clean. 02/13/22   [provider]  Torsemide 40 MG TABS Take by mouth. 02/26/22 02/26/23  [provider]  umeclidinium bromide (INCRUSE ELLIPTA) 62.5 MCG/ACT AEPB Inhale 1 puff into the lungs daily. For your breathing 02/18/22   Marnee Guarneri T, NP  Zinc Oxide 10 % OINT Apply topically.    [provider]     Family History  Problem Relation Age of Onset   Diabetes Father    Esophageal cancer Mother    Alzheimer's disease Paternal Uncle     Social History   Socioeconomic History   Marital status: Married    Spouse name: Not on file   Number of children: Not on file   Years of education: Not on file   Highest education level: GED or equivalent  Occupational History  Occupation: retired    Comment: [employer]  Tobacco Use   Smoking status: Former    Packs/day: 1.00    Years: 30.00    Total pack years: 30.00    Types: Cigarettes    Quit date: 03/23/1989    Years since quitting: 33.1    Passive exposure: Past   Smokeless tobacco: Never  Vaping Use   Vaping Use: Never used  Substance and Sexual Activity   Alcohol use: No    Alcohol/week: 0.0 standard drinks of alcohol   Drug use: No   Sexual activity: Not Currently  Other Topics Concern   Not on file  Social History Narrative   [employer]    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    Social Determinants of Health   Financial Resource Strain: Low Risk  (03/03/2022)   Overall Financial Resource Strain (CARDIA)    Difficulty of Paying Living Expenses: Not hard at all  Food Insecurity: No Food Insecurity (03/03/2022)   Hunger Vital Sign    Worried About Running Out of Food in the Last Year: Never true    [place] in the Last Year: Never true  Transportation Needs: No Transportation Needs (03/03/2022)    PRAPARE - Hydrologist (Medical): No    Lack of Transportation (Non-Medical): No  Physical Activity: Inactive (03/03/2022)   Exercise Vital Sign    Days of Exercise per Week: 0 days    Minutes of Exercise per Session: 0 min  Stress: No Stress Concern Present (03/03/2022)   [place]    Feeling of Stress : Only a little  Social Connections: Moderately Integrated (03/03/2022)   Social Connection and Isolation Panel [NHANES]    Frequency of Communication with Friends and Family: More than three times a week    Frequency of Social Gatherings with Friends and Family: More than three times a week    Attends Religious Services: More than 4 times per year    Active Member of [employer] or Organizations: No    Attends Archivist Meetings: Never    Marital Status: Married    Review of Systems: A 12 point ROS discussed and pertinent positives are indicated in the HPI above.  All other systems are negative.  Review of Systems  Vital Signs: There were no vitals taken for this visit.  Physical Exam  Imaging: No results found.  Labs:  CBC: Recent Labs    02/04/22 1440 03/10/22 1551 04/01/22 1930 04/02/22 0638  WBC 7.0 6.2 6.7 7.6  HGB 12.4* 11.6* 11.6* 11.4*  HCT 37.7 35.6* 36.0* 35.3*  PLT 317 312 297 273    COAGS: Recent Labs    06/25/21 1837 09/19/21 1650 10/12/21 2302 11/17/21 2142 11/18/21 0329 11/18/21 1756 11/19/21 0147 11/20/21 0439  INR 1.1 1.3*  --  2.9* 2.6*  --   --   --   APTT  --  29   < > 44*  --  83* 68* 70*   < > = values in this interval not displayed.    BMP: Recent Labs    01/31/22 0350 02/02/22 0049 02/04/22 1440 02/23/22 1611 04/01/22 1930 04/02/22 0638 04/21/22 1032  NA 142 138   < > 140 138 137 134  K 3.1* 3.1*   < > 3.5 3.8 3.6 4.2  CL 108 106   < > 104 101 99 95*  CO2 29 25   < > 22 27 26  25  GLUCOSE 157* 209*   < > 149* 279* 243*  276*  BUN 14 13   < > 11 27* 27* 19  CALCIUM 9.5 9.8   < > 10.0 9.4 9.7 9.5  CREATININE 1.06 1.07   < > 1.04 1.55* 1.30* 1.32*  GFRNONAA >60 >60  --   --  44* 55*  --    < > = values in this interval not displayed.    LIVER FUNCTION TESTS: Recent Labs    01/31/22 0350 02/04/22 1440 04/01/22 1930 04/02/22 0638  BILITOT 0.5 0.3 0.5 0.5  AST 14* 12 16 15  $ ALT 8 9 10 10  $ ALKPHOS 47 63 57 57  PROT 6.0* 6.6 7.4 7.5  ALBUMIN 2.8* 3.4* 3.4* 3.4*    TUMOR MARKERS: No results for input(s): "AFPTM", "CEA", "CA199", "CHROMGRNA" in the last 8760 hours.  Assessment and Plan:  Acute urinary retention secondary to benign prostatic hyperplasia; catheter dependency: Justin Griffith. Justin Griffith, 82 year old male, presents today to the [facility] department for an image-guided prostate artery embolization.  Risks and benefits of prostate artery embolization were discussed with the patient including, but not limited to bleeding, infection, vascular injury or contrast induced renal failure.  This interventional procedure involves the use of X-rays and because of the nature of the planned procedure, it is possible that we will have prolonged use of X-ray fluoroscopy.  Potential radiation risks to you include (but are not limited to) the following: - A slightly elevated risk for cancer  several years later in life. This risk is typically less than 0.5% percent. This risk is low in comparison to the normal incidence of human cancer, which is 33% for women and 50% for men according to the [place]. - Radiation induced injury can include skin redness, resembling a rash, tissue breakdown / ulcers and hair loss (which can be temporary or permanent).   The likelihood of either of these occurring depends on the difficulty of the procedure and whether you are sensitive to radiation due to previous procedures, disease, or genetic conditions.   IF your procedure requires a  prolonged use of radiation, you will be notified and given written instructions for further action.  It is your responsibility to monitor the irradiated area for the 2 weeks following the procedure and to notify your physician if you are concerned that you have suffered a radiation induced injury.    All of the patient's questions were answered, patient is agreeable to proceed. He has been NPO.   Consent signed and in chart.   Thank you for this interesting consult.  I greatly enjoyed meeting Justin Griffith and look forward to participating in their care.  A copy of this report was sent to the requesting provider on this date.  Electronically Signed: Soyla Dryer, AGACNP-BC [phone] 05/06/2022, 10:57 AM   I spent a total of  30 Minutes   in face to face in clinical consultation, greater than 50% of which was counseling/coordinating care for ***

## 2022-05-06 NOTE — Telephone Encounter (Signed)
Copied from [place] [id number]. Topic: General - Other >> May 06, 2022  1:57 PM Ludger Nutting wrote: [place] Orders - Caller/Agency: [facility] Number: [phone] Requesting OT/PT/Skilled Nursing/Social Work/Speech Therapy: PT  Frequency:2x4

## 2022-05-06 NOTE — Telephone Encounter (Signed)
Copied from [place] [id number]. Topic: Appointment Scheduling - Scheduling Inquiry for Clinic >> May 06, 2022 12:18 PM Justin Griffith wrote: Reason for CRM: patient called in states, Dr Wynetta Emery wants to seem him within a week of his surgery,but I dont have anything until March. Plese call back for further assistance

## 2022-05-07 ENCOUNTER — Other Ambulatory Visit (HOSPITAL_COMMUNITY): Payer: Self-pay | Admitting: Interventional Radiology

## 2022-05-07 ENCOUNTER — Encounter (HOSPITAL_COMMUNITY): Payer: Self-pay

## 2022-05-07 ENCOUNTER — Ambulatory Visit (HOSPITAL_COMMUNITY)
Admission: RE | Admit: 2022-05-07 | Discharge: 2022-05-07 | Disposition: A | Discharge status: Home / Self Care | Payer: PPO | Source: Ambulatory Visit | Attending: Interventional Radiology | Admitting: Interventional Radiology

## 2022-05-07 DIAGNOSIS — N401 Enlarged prostate with lower urinary tract symptoms: Secondary | ICD-10-CM

## 2022-05-07 DIAGNOSIS — R319 Hematuria, unspecified: Secondary | ICD-10-CM

## 2022-05-07 DIAGNOSIS — Z7901 Long term (current) use of anticoagulants: Secondary | ICD-10-CM | POA: Insufficient documentation

## 2022-05-07 DIAGNOSIS — R338 Other retention of urine: Secondary | ICD-10-CM | POA: Diagnosis not present

## 2022-05-07 DIAGNOSIS — I252 Old myocardial infarction: Secondary | ICD-10-CM | POA: Insufficient documentation

## 2022-05-07 DIAGNOSIS — Z8673 Personal history of transient ischemic attack (TIA), and cerebral infarction without residual deficits: Secondary | ICD-10-CM | POA: Insufficient documentation

## 2022-05-07 DIAGNOSIS — N400 Enlarged prostate without lower urinary tract symptoms: Secondary | ICD-10-CM | POA: Diagnosis not present

## 2022-05-07 DIAGNOSIS — J449 Chronic obstructive pulmonary disease, unspecified: Secondary | ICD-10-CM | POA: Diagnosis not present

## 2022-05-07 DIAGNOSIS — E119 Type 2 diabetes mellitus without complications: Secondary | ICD-10-CM | POA: Insufficient documentation

## 2022-05-07 DIAGNOSIS — Z87891 Personal history of nicotine dependence: Secondary | ICD-10-CM | POA: Insufficient documentation

## 2022-05-07 DIAGNOSIS — R339 Retention of urine, unspecified: Secondary | ICD-10-CM | POA: Diagnosis not present

## 2022-05-07 HISTORY — PX: IR 3D INDEPENDENT WKST: IMG2385

## 2022-05-07 HISTORY — PX: IR ANGIOGRAM SELECTIVE EACH ADDITIONAL VESSEL: IMG667

## 2022-05-07 HISTORY — PX: IR US GUIDE VASC ACCESS LEFT: IMG2389

## 2022-05-07 HISTORY — PX: IR ANGIOGRAM PELVIS SELECTIVE OR SUPRASELECTIVE: IMG661

## 2022-05-07 HISTORY — PX: IR EMBO ARTERIAL NOT HEMORR HEMANG INC GUIDE ROADMAPPING: IMG5448

## 2022-05-07 LAB — GLUCOSE, CAPILLARY: Glucose-Capillary: 322 mg/dL — ABNORMAL HIGH (ref 70–99)

## 2022-05-07 LAB — CBC WITH DIFFERENTIAL/PLATELET
Abs Immature Granulocytes: 0.06 10*3/uL (ref 0.00–0.07)
Basophils Absolute: 0.1 10*3/uL (ref 0.0–0.1)
Basophils Relative: 1 %
Eosinophils Absolute: 0.2 10*3/uL (ref 0.0–0.5)
Eosinophils Relative: 3 %
HCT: 29.2 % — ABNORMAL LOW (ref 39.0–52.0)
Hemoglobin: 9.1 g/dL — ABNORMAL LOW (ref 13.0–17.0)
Immature Granulocytes: 1 %
Lymphocytes Relative: 14 %
Lymphs Abs: 1.2 10*3/uL (ref 0.7–4.0)
MCH: 26.8 pg (ref 26.0–34.0)
MCHC: 31.2 g/dL (ref 30.0–36.0)
MCV: 85.9 fL (ref 80.0–100.0)
Monocytes Absolute: 0.8 10*3/uL (ref 0.1–1.0)
Monocytes Relative: 9 %
Neutro Abs: 6.4 10*3/uL (ref 1.7–7.7)
Neutrophils Relative %: 72 %
Platelets: 385 10*3/uL (ref 150–400)
RBC: 3.4 MIL/uL — ABNORMAL LOW (ref 4.22–5.81)
RDW: 13.7 % (ref 11.5–15.5)
WBC: 8.7 10*3/uL (ref 4.0–10.5)
nRBC: 0 % (ref 0.0–0.2)

## 2022-05-07 LAB — COMPREHENSIVE METABOLIC PANEL
ALT: 8 U/L (ref 0–44)
AST: 12 U/L — ABNORMAL LOW (ref 15–41)
Albumin: 3 g/dL — ABNORMAL LOW (ref 3.5–5.0)
Alkaline Phosphatase: 62 U/L (ref 38–126)
Anion gap: 12 (ref 5–15)
BUN: 24 mg/dL — ABNORMAL HIGH (ref 8–23)
CO2: 27 mmol/L (ref 22–32)
Calcium: 9.3 mg/dL (ref 8.9–10.3)
Chloride: 95 mmol/L — ABNORMAL LOW (ref 98–111)
Creatinine, Ser: 1.45 mg/dL — ABNORMAL HIGH (ref 0.61–1.24)
GFR, Estimated: 48 mL/min — ABNORMAL LOW (ref >60)
Glucose, Bld: 312 mg/dL — ABNORMAL HIGH (ref 70–99)
Potassium: 3.7 mmol/L (ref 3.5–5.1)
Sodium: 134 mmol/L — ABNORMAL LOW (ref 135–145)
Total Bilirubin: 0.5 mg/dL (ref 0.3–1.2)
Total Protein: 7.3 g/dL (ref 6.5–8.1)

## 2022-05-07 LAB — PROTIME-INR
INR: 1.1 (ref 0.8–1.2)
Prothrombin Time: 14.4 seconds (ref 11.4–15.2)

## 2022-05-07 MED ORDER — HEPARIN SODIUM (PORCINE) 1000 UNIT/ML IJ SOLN
INTRAMUSCULAR | Status: AC
  Filled 2022-05-07: qty 10

## 2022-05-07 MED ORDER — IOHEXOL 300 MG/ML  SOLN
100.0000 mL | Freq: Once | INTRAMUSCULAR | Status: AC | PRN
  Administered 2022-05-07: 50 mL via INTRA_ARTERIAL

## 2022-05-07 MED ORDER — PHENAZOPYRIDINE HCL 100 MG PO TABS: 100.0000 mg | ORAL_TABLET | Freq: Three times a day (TID) | ORAL | 0 refills | Status: AC | PRN

## 2022-05-07 MED ORDER — CIPROFLOXACIN HCL 500 MG PO TABS: 500.0000 mg | ORAL_TABLET | Freq: Two times a day (BID) | ORAL | 0 refills | Status: AC

## 2022-05-07 MED ORDER — VERAPAMIL HCL 2.5 MG/ML IV SOLN: INTRA_ARTERIAL | Status: AC | PRN

## 2022-05-07 MED ORDER — FENTANYL CITRATE (PF) 100 MCG/2ML IJ SOLN
INTRAMUSCULAR | Status: AC
  Filled 2022-05-07: qty 2

## 2022-05-07 MED ORDER — NITROGLYCERIN IN D5W 100-5 MCG/ML-% IV SOLN
INTRAVENOUS | Status: AC
  Filled 2022-05-07: qty 250

## 2022-05-07 MED ORDER — MIDAZOLAM HCL 2 MG/2ML IJ SOLN
INTRAMUSCULAR | Status: AC | PRN
  Administered 2022-05-07 (×4): 1 mg via INTRAVENOUS

## 2022-05-07 MED ORDER — SODIUM CHLORIDE 0.9 % IV SOLN: INTRAVENOUS | Status: DC

## 2022-05-07 MED ORDER — IOHEXOL 300 MG/ML  SOLN
100.0000 mL | Freq: Once | INTRAMUSCULAR | Status: AC | PRN
  Administered 2022-05-07: 30 mL via INTRA_ARTERIAL

## 2022-05-07 MED ORDER — FENTANYL CITRATE (PF) 100 MCG/2ML IJ SOLN
INTRAMUSCULAR | Status: AC | PRN
  Administered 2022-05-07 (×4): 50 ug via INTRAVENOUS

## 2022-05-07 MED ORDER — NITROGLYCERIN 2 % TD OINT
TOPICAL_OINTMENT | TRANSDERMAL | Status: AC
  Filled 2022-05-07: qty 1

## 2022-05-07 MED ORDER — NITROGLYCERIN 1 MG/10 ML FOR IR/CATH LAB
INTRA_ARTERIAL | Status: AC | PRN
  Administered 2022-05-07: 200 ug via INTRA_ARTERIAL

## 2022-05-07 MED ORDER — LIDOCAINE HCL (PF) 1 % IJ SOLN
INTRAMUSCULAR | Status: AC
  Administered 2022-05-07: 1 mL
  Filled 2022-05-07: qty 30

## 2022-05-07 MED ORDER — LIDOCAINE-PRILOCAINE 2.5-2.5 % EX CREA
TOPICAL_CREAM | Freq: Once | CUTANEOUS | Status: AC
  Administered 2022-05-07: 1 via TOPICAL
  Filled 2022-05-07: qty 5

## 2022-05-07 MED ORDER — MIDAZOLAM HCL 2 MG/2ML IJ SOLN
INTRAMUSCULAR | Status: AC
  Filled 2022-05-07: qty 4

## 2022-05-07 MED ORDER — VERAPAMIL HCL 2.5 MG/ML IV SOLN
INTRAVENOUS | Status: AC
  Filled 2022-05-07: qty 2

## 2022-05-07 MED ORDER — CIPROFLOXACIN IN D5W 400 MG/200ML IV SOLN
400.0000 mg | INTRAVENOUS | Status: AC
  Administered 2022-05-07: 400 mg via INTRAVENOUS
  Filled 2022-05-07: qty 200

## 2022-05-07 MED ORDER — KETOROLAC TROMETHAMINE 30 MG/ML IJ SOLN: 30.0000 mg | INTRAMUSCULAR | Status: DC

## 2022-05-07 MED ORDER — ACETAMINOPHEN 500 MG PO TABS
1000.0000 mg | ORAL_TABLET | Freq: Once | ORAL | Status: AC
  Administered 2022-05-07: 1000 mg via ORAL
  Filled 2022-05-07: qty 2

## 2022-05-07 NOTE — Telephone Encounter (Signed)
Please double book him

## 2022-05-07 NOTE — Telephone Encounter (Signed)
OK for verbal orders?

## 2022-05-07 NOTE — Procedures (Signed)
Interventional Radiology Procedure Note  Procedure: Prostate artery embolization  Findings: Please refer to procedural dictation for full description. Technically successful PAE of dominant right prostatic artery.  Diminutive left prostatic artery without significant glandular supply.  Left radial access with TR band closure.  Complications: None immediate  Estimated Blood Loss: < 5 mL  Recommendations: TR band release protocol. Discharge home after TR band removed. IR will arrange for 1 month follow up in clinic. Plan to follow up with Dr. Versie Starks for voiding trail. -phenazopyridine 100 mg TID x 7 days  -ciprofloxacin 500 mg BID x 7 days    Ruthann Cancer, MD

## 2022-05-07 NOTE — Telephone Encounter (Signed)
Called and left voicemail letting patient know that we have him scheduled for 05/14/2022 at 10:00 am.

## 2022-05-07 NOTE — Telephone Encounter (Signed)
Left message for Justin Griffith from [facility] providing verbal orders OK per Dr.Johnson. Advised to give our office a call back if patient calls back.

## 2022-05-08 ENCOUNTER — Other Ambulatory Visit: Payer: Self-pay | Admitting: Interventional Radiology

## 2022-05-08 ENCOUNTER — Other Ambulatory Visit: Payer: Self-pay | Admitting: Physician Assistant

## 2022-05-08 DIAGNOSIS — N401 Enlarged prostate with lower urinary tract symptoms: Secondary | ICD-10-CM

## 2022-05-11 ENCOUNTER — Ambulatory Visit (INDEPENDENT_AMBULATORY_CARE_PROVIDER_SITE_OTHER): Payer: PPO

## 2022-05-11 ENCOUNTER — Telehealth: Payer: Self-pay | Admitting: Urology

## 2022-05-11 ENCOUNTER — Telehealth: Payer: Self-pay

## 2022-05-11 ENCOUNTER — Telehealth: Payer: PPO

## 2022-05-11 ENCOUNTER — Encounter (HOSPITAL_COMMUNITY): Payer: Self-pay | Admitting: Radiology

## 2022-05-11 DIAGNOSIS — E1122 Type 2 diabetes mellitus with diabetic chronic kidney disease: Secondary | ICD-10-CM

## 2022-05-11 DIAGNOSIS — J449 Chronic obstructive pulmonary disease, unspecified: Secondary | ICD-10-CM

## 2022-05-11 DIAGNOSIS — I5032 Chronic diastolic (congestive) heart failure: Secondary | ICD-10-CM

## 2022-05-11 DIAGNOSIS — R296 Repeated falls: Secondary | ICD-10-CM

## 2022-05-11 NOTE — Telephone Encounter (Signed)
Patient called stating he has been calling for the last 5 days and leaving messages and no one has called him back. He is very upset. He stated "what is wrong with you people up there? What if I had an emergency and no one will call him back?" I apologized to the patient several times, there are no phone notes about him calling us, I advised patient that I do not know what happened with those calls but I would make sure this message gets to our manager and the provider. I advised patient that he is scheduled with Sam for 06/09/22 for voiding trial. Patient stated he has written down that he has an appointment with Dr Diamantina Providence on 06/04/22. Advised patient this was cancelled but not sure why and he does not know why. He does not want to see Sam or anyone else except for Dr Diamantina Providence who is his doctor.  I advised patient I would ask provider and manager look into this and give him a call back. Patient was very angry/upset.

## 2022-05-11 NOTE — Patient Instructions (Signed)
Please call the care guide team at [phone] if you need to cancel or reschedule your appointment.   If you are experiencing a Mental Health or [place] or need someone to talk to, please call the Suicide and Crisis Lifeline: 988 call the [place] National Suicide Prevention Lifeline: 850-728-9425 or TTY: 878-815-7245 TTY 5636614875) to talk to a trained counselor call 1-800-273-TALK (toll free, 24 hour hotline)   Following is a copy of the CCM Program Consent:  CCM service includes personalized support from designated clinical staff supervised by the physician, including individualized plan of care and coordination with other care providers 24/7 contact phone numbers for assistance for urgent and routine care needs. Service will only be billed when office clinical staff spend 20 minutes or more in a month to coordinate care. Only one practitioner may furnish and bill the service in a calendar month. The patient may stop CCM services at amy time (effective at the end of the month) by phone call to the office staff. The patient will be responsible for cost sharing (co-pay) or up to 20% of the service fee (after annual deductible is met)  Following is a copy of your full provider care plan:   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   Planned Interventions: Provided patient with basic written and verbal COPD education on self  care/management/and exacerbation prevention. The patient denies any issues with his breathing. The patient states his COPD is stable. The patient is having elevations in his blood pressures. Sees the pcp regularly. Adjustment of medications Advised patient to track and manage COPD triggers. Review of factors that cause exacerbation. The patient states his breathing is stable right now Provided written and verbal instructions on pursed lip breathing and utilized returned demonstration as teach back Provided instruction about proper use of medications used for management of COPD including inhalers. States compliance with medications.  Advised patient to self assesses COPD action plan zone and make appointment with provider if in the yellow zone for 48 hours without improvement Provided education about and advised patient to utilize infection prevention strategies to reduce risk of respiratory infection Discussed the importance of adequate rest and management of fatigue with COPD Screening for signs and symptoms of depression related to chronic disease state  Assessed social determinant of health barriers Pharm D referral for assistance with [place] cost constraints. The patient is working with the pharm D.   Symptom Management: Take medications as prescribed   Attend all scheduled provider appointments Call provider office for new concerns or questions  call the Suicide and Crisis Lifeline: 988 call the [place] National Suicide Prevention Lifeline: (641) 759-9355 or TTY: 970-318-3975 TTY 574 136 2721) to talk to a trained counselor call 1-800-273-TALK (toll free, 24 hour hotline) if experiencing a Mental Health or [place]  avoid second hand smoke eliminate smoking in my home identify and remove indoor air pollutants limit outdoor activity  during cold weather listen for public air quality announcements every day develop a rescue plan eliminate symptom triggers at home follow  rescue plan if symptoms flare-up  Follow Up Plan: Telephone follow up appointment with care management team member scheduled for: 06-22-2022 at 1145 am       CCM Expected Outcome:  Monitor, Self-Manage and Reduce Symptoms of Diabetes       Current Barriers:  Knowledge Deficits related to the importance of good blood sugar control in a patient with DM Care Coordination needs related to ongoing education and support for management of medications and disease management  in a patient with DM Chronic Disease Management support and education needs related to effective management of DM Lacks caregiver support.   Lab Results  Component Value Date   HGBA1C 6.3 (H) 02/04/2022     Planned Interventions: Provided education to patient about basic DM disease process; Reviewed medications with patient and discussed importance of medication.  adherence. The patient is now using pill packs and he says this is working out well for him. Encouraged him to take his medications as prescribed and call for any questions or concerns. Denies any issues with his DM at this time. Will continue to monitor. DM is stable. Reviewed prescribed diet with patient heart healthy/ADA diet. The patient states compliance with heart healthy/Ada diet. Education given on monitoring for hidden sodium; Counseled on importance of regular laboratory monitoring as prescribed. Has regular labwork        Discussed plans with patient for ongoing care management follow up and provided patient with direct contact information for care management team;      Provided patient with written educational materials related to hypo and hyperglycemia and importance of correct treatment;       Reviewed scheduled/upcoming provider appointments including: 04-21-2022 at 10 am, reminder given today;         call provider for findings outside established parameters;       Review of patient status, including review of consultants reports, relevant laboratory and  other test results, and medications completed;       Advised patient to discuss changes in his DM and questions or concerns with provider;      Screening for signs and symptoms of depression related to chronic disease state;        Assessed social determinant of health barriers;         Symptom Management: Take medications as prescribed   Attend all scheduled provider appointments Call provider office for new concerns or questions  call the Suicide and Crisis Lifeline: 988 call the [place] National Suicide Prevention Lifeline: 867-299-2734 or TTY: 224-856-6209 TTY 857-560-2466) to talk to a trained counselor call 1-800-273-TALK (toll free, 24 hour hotline) if experiencing a Mental Health or [place]  check feet daily for cuts, sores or redness trim toenails straight across manage portion size wash and dry feet carefully every day wear comfortable, cotton socks wear comfortable, well-fitting shoes  Follow Up Plan: Telephone follow up appointment with care management team member scheduled for: 06-22-2022 at 1145 am       CCM Expected Outcome:  Monitor, Self-Manage and Reduce Symptoms of Heart Failure       Current Barriers:  Knowledge Deficits related to the importance of monitoring for changes in water weight and effective management of HF Chronic Disease Management support and education needs related to effective management of HF Lacks caregiver support.  Wt Readings from Last 3 Encounters:  05/07/22 251 lb  1.6 oz (113.9 kg)  04/30/22 251 lb 1.6 oz (113.9 kg)  04/21/22 252 lb 6.4 oz (114.5 kg)    BP Readings from Last 3 Encounters:  05/07/22 (!) 163/79  04/30/22 (!) 173/82  04/21/22 (!) 189/80     Planned Interventions: Basic overview and discussion of pathophysiology of Heart Failure reviewed. The patients weight is stable. The patient has been having elevated blood pressures. The patient has had adjustment in medications. Denies headaches today. States he does  not know what his blood pressures at home are. Encouraged the patient to take his blood pressures at home and record. Education and support given.  Provided education on low sodium diet. The patient states he does not add salt to his foods. He uses a product called "no salt". The patient states that he is good about not eating foods high in sodium.  Reminder provided on making sure he follows a heart healthy/ADA diet. Review and education provided Reviewed Heart Failure Action Plan in depth and provided written copy Assessed need for readable accurate scales in home. Discussed safety in weighing. The patient has a hight fall risk. Uses a walker Provided education about placing scale on hard, flat surface Advised patient to weigh each morning after emptying bladder. The patient currently has an indwelling foley catheter for recent urinary retention. The patient had successful surgery on 05-07-2022. The patient states he does have to wear the catheter longer and will see the specialist again in a month. Hopefully after that he will not have to wear the catheter any longer.   Discussed importance of daily weight and advised patient to weigh and record daily. The patient is having fluctuations of weights at this time.  Reviewed role of diuretics in prevention of fluid overload and management of heart failure. The patient states he is taking his medications as prescribed  Discussed the importance of keeping all appointments with provider. The patient has a follow up with the pcp on 05-14-2022 at 10 am.  Provided patient with education about the role of exercise in the management of heart failure Advised patient to discuss changes in his heart failure or heart health with provider Screening for signs and symptoms of depression related to chronic disease state  Assessed social determinant of health barriers  Symptom Management: Take medications as prescribed   Attend all scheduled provider appointments Call  provider office for new concerns or questions  call the Suicide and Crisis Lifeline: 988 call the [place] National Suicide Prevention Lifeline: 530-754-2801 or TTY: 236-580-2684 TTY (484)086-5555) to talk to a trained counselor call 1-800-273-TALK (toll free, 24 hour hotline) if experiencing a Mental Health or [place]  call office if I gain more than 2 pounds in one day or 5 pounds in one week keep legs up while sitting use salt in moderation watch for swelling in feet, ankles and legs every day weigh myself daily develop a rescue plan follow rescue plan if symptoms flare-up track symptoms and what helps feel better or worse dress right for the weather, hot or cold  Follow Up Plan: Telephone follow up appointment with care management team member scheduled for: 06-22-2022 at 1145 am       CCM Expected Outcome:  Monitor, Self-Manage and Reduce Symptoms of: Falls and safety concerns       Current Barriers:  Knowledge Deficits related to safety concerns and falls prevention in patient with multiple chronic conditions and history of falls with injury Care Coordination needs related to resources in  the community and ongoing support in management of chronic conditions and fall prevention in a patient with history of falls with most recent being February 2024 Chronic Disease Management support and education needs related to effective management of falls prevention and safety concerns in a patient with multiple chronic conditions Lacks caregiver support.   Planned Interventions: Provided  verbal education re: potential causes of falls and Fall prevention strategies Reviewed medications and discussed potential side effects of medications such as dizziness and frequent urination. The patient states he cannot remember why he fell recently. He states that he is taking his medications. Has had a procedure and is wearing an indwelling foley catheter. Education provided.  Advised patient of  importance of notifying provider of falls. The patient states he had a fall "3 weeks" ago and he does not remember why he fell. He sees the pcp on 05-14-2022 for follow up. Education on reporting new falls.  Assessed for signs and symptoms of orthostatic hypotension. The patient is having elevation in blood pressures. Denies any headaches.  Assessed for falls since last encounter. Last fall occurred "3 weeks ago" per the patient. Education and fall prevention reviewed.  Assessed patients knowledge of fall risk prevention secondary to previously provided education Provided patient information for fall alert systems Assessed working status of life alert bracelet and patient adherence Advised patient to discuss changes in mobility and new falls with provider. Patient will have a follow up with the pcp on 05-14-2022. Review of discussing changes in his condition with the pcp. The patient has a history of high fall risk. Education provided.  Screening for signs and symptoms of depression related to chronic disease state Assessed social determinant of health barriers  Symptom Management: Take medications as prescribed   Attend all scheduled provider appointments Call provider office for new concerns or questions  call the Suicide and Crisis Lifeline: 988 call the [place] National Suicide Prevention Lifeline: 743-099-1241 or TTY: 757-013-7566 TTY 838 709 9389) to talk to a trained counselor call 1-800-273-TALK (toll free, 24 hour hotline) if experiencing a Mental Health or [place]   Follow Up Plan: Telephone follow up appointment with care management team member scheduled for: 06-22-2022 at 1145 am          The patient verbalized understanding of instructions, educational materials, and care plan provided today and DECLINED offer to receive copy of patient instructions, educational materials, and care plan.   Telephone follow up appointment with care management team member scheduled  for: 06-22-2022

## 2022-05-11 NOTE — Chronic Care Management (AMB) (Signed)
Chronic Care Management   CCM RN Visit Note  05/11/2022 Name: Justin Griffith MRN: [id number] DOB: 10-Mar-1941  Subjective: Justin Griffith is a 82 y.o. year old male who is a primary care patient of Valerie Roys, DO. The patient was referred to the Chronic Care Management team for assistance with care management needs subsequent to provider initiation of CCM services and plan of care.    Today's Visit:  Engaged with patient by telephone for follow up visit.        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   Planned Interventions: Provided patient with basic written and verbal COPD education on self care/management/and exacerbation prevention. The patient denies any issues with his breathing. The patient states his COPD is stable. The patient is having elevations in his blood pressures. Sees the pcp regularly. Adjustment of medications Advised patient to track and manage COPD triggers. Review of factors that cause exacerbation. The patient states his breathing is stable right now Provided written and verbal instructions on pursed lip breathing and utilized returned demonstration as teach back Provided instruction about proper use of medications used for management of COPD including inhalers. States compliance with medications.  Advised patient to self assesses COPD action plan zone and make appointment with provider if in the yellow zone for 48  hours without improvement Provided education about and advised patient to utilize infection prevention strategies to reduce risk of respiratory infection Discussed the importance of adequate rest and management of fatigue with COPD Screening for signs and symptoms of depression related to chronic disease state  Assessed social determinant of health barriers Pharm D referral for assistance with [place] cost constraints. The patient is working with the pharm D.   Symptom Management: Take medications as prescribed   Attend all scheduled provider appointments Call provider office for new concerns or questions  call the Suicide and Crisis Lifeline: 988 call the [place] National Suicide Prevention Lifeline: 440-348-3076 or TTY: 831-453-9994 TTY (651) 291-8515) to talk to a trained counselor call 1-800-273-TALK (toll free, 24 hour hotline) if experiencing a Mental Health or [place]  avoid second hand smoke eliminate smoking in my home identify and remove indoor air pollutants limit outdoor activity during cold weather listen for public air quality announcements every day develop a rescue plan eliminate symptom triggers at home follow rescue plan if symptoms flare-up  Follow Up Plan: Telephone follow up appointment with care management team member scheduled for: 06-22-2022 at 1145 am       CCM Expected Outcome:  Monitor, Self-Manage and Reduce Symptoms of Diabetes       Current Barriers:  Knowledge Deficits related to the importance of good blood sugar control in a patient with DM Care Coordination needs related to ongoing education and support for management of medications and disease management  in a patient with DM Chronic Disease Management support and education needs related to  effective management of DM Lacks caregiver support.   Lab Results  Component Value Date   HGBA1C 6.3 (H) 02/04/2022     Planned Interventions: Provided education to patient about basic DM  disease process; Reviewed medications with patient and discussed importance of medication.  adherence. The patient is now using pill packs and he says this is working out well for him. Encouraged him to take his medications as prescribed and call for any questions or concerns. Denies any issues with his DM at this time. Will continue to monitor. DM is stable. Reviewed prescribed diet with patient heart healthy/ADA diet. The patient states compliance with heart healthy/Ada diet. Education given on monitoring for hidden sodium; Counseled on importance of regular laboratory monitoring as prescribed. Has regular labwork        Discussed plans with patient for ongoing care management follow up and provided patient with direct contact information for care management team;      Provided patient with written educational materials related to hypo and hyperglycemia and importance of correct treatment;       Reviewed scheduled/upcoming provider appointments including: 04-21-2022 at 10 am, reminder given today;         call provider for findings outside established parameters;       Review of patient status, including review of consultants reports, relevant laboratory and other test results, and medications completed;       Advised patient to discuss changes in his DM and questions or concerns with provider;      Screening for signs and symptoms of depression related to chronic disease state;        Assessed social determinant of health barriers;         Symptom Management: Take medications as prescribed   Attend all scheduled provider appointments Call provider office for new concerns or questions  call the Suicide and Crisis Lifeline: 988 call the [place] National Suicide Prevention Lifeline: 708-877-0418 or TTY: (604)787-7643 TTY 307-334-7948) to talk to a trained counselor call 1-800-273-TALK (toll free, 24 hour hotline) if experiencing a Mental Health or [place]  check feet daily for  cuts, sores or redness trim toenails straight across manage portion size wash and dry feet carefully every day wear comfortable, cotton socks wear comfortable, well-fitting shoes  Follow Up Plan: Telephone follow up appointment with care management team member scheduled for: 06-22-2022 at 1145 am       CCM Expected Outcome:  Monitor, Self-Manage and Reduce Symptoms of Heart Failure       Current Barriers:  Knowledge Deficits related to the importance of monitoring for changes in water weight and effective management of HF Chronic Disease Management support and education needs related to effective management of HF Lacks caregiver support.  Wt Readings from Last 3 Encounters:  05/07/22 251 lb 1.6 oz (113.9 kg)  04/30/22 251 lb 1.6 oz (113.9 kg)  04/21/22 252 lb 6.4 oz (114.5 kg)    BP Readings from Last 3 Encounters:  05/07/22 (!) 163/79  04/30/22 (!) 173/82  04/21/22 (!) 189/80     Planned Interventions: Basic overview and discussion of pathophysiology of Heart Failure reviewed. The patients weight is stable. The patient has been having elevated blood pressures. The patient has had adjustment in medications. Denies headaches today. States he does not know what his blood pressures at home are. Encouraged the patient to take his blood pressures at home and record. Education and support given.  Provided education on low sodium diet. The patient states  he does not add salt to his foods. He uses a product called "no salt". The patient states that he is good about not eating foods high in sodium.  Reminder provided on making sure he follows a heart healthy/ADA diet. Review and education provided Reviewed Heart Failure Action Plan in depth and provided written copy Assessed need for readable accurate scales in home. Discussed safety in weighing. The patient has a hight fall risk. Uses a walker Provided education about placing scale on hard, flat surface Advised patient to weigh each morning  after emptying bladder. The patient currently has an indwelling foley catheter for recent urinary retention. The patient had successful surgery on 05-07-2022. The patient states he does have to wear the catheter longer and will see the specialist again in a month. Hopefully after that he will not have to wear the catheter any longer.   Discussed importance of daily weight and advised patient to weigh and record daily. The patient is having fluctuations of weights at this time.  Reviewed role of diuretics in prevention of fluid overload and management of heart failure. The patient states he is taking his medications as prescribed  Discussed the importance of keeping all appointments with provider. The patient has a follow up with the pcp on 05-14-2022 at 10 am.  Provided patient with education about the role of exercise in the management of heart failure Advised patient to discuss changes in his heart failure or heart health with provider Screening for signs and symptoms of depression related to chronic disease state  Assessed social determinant of health barriers  Symptom Management: Take medications as prescribed   Attend all scheduled provider appointments Call provider office for new concerns or questions  call the Suicide and Crisis Lifeline: 988 call the [place] National Suicide Prevention Lifeline: 402-863-1209 or TTY: 952 240 2930 TTY 606-804-3768) to talk to a trained counselor call 1-800-273-TALK (toll free, 24 hour hotline) if experiencing a Mental Health or [place]  call office if I gain more than 2 pounds in one day or 5 pounds in one week keep legs up while sitting use salt in moderation watch for swelling in feet, ankles and legs every day weigh myself daily develop a rescue plan follow rescue plan if symptoms flare-up track symptoms and what helps feel better or worse dress right for the weather, hot or cold  Follow Up Plan: Telephone follow up appointment with  care management team member scheduled for: 06-22-2022 at 1145 am       CCM Expected Outcome:  Monitor, Self-Manage and Reduce Symptoms of: Falls and safety concerns       Current Barriers:  Knowledge Deficits related to safety concerns and falls prevention in patient with multiple chronic conditions and history of falls with injury Care Coordination needs related to resources in the community and ongoing support in management of chronic conditions and fall prevention in a patient with history of falls with most recent being February 2024 Chronic Disease Management support and education needs related to effective management of falls prevention and safety concerns in a patient with multiple chronic conditions Lacks caregiver support.   Planned Interventions: Provided  verbal education re: potential causes of falls and Fall prevention strategies Reviewed medications and discussed potential side effects of medications such as dizziness and frequent urination. The patient states he cannot remember why he fell recently. He states that he is taking his medications. Has had a procedure and is wearing an indwelling foley catheter. Education provided.  Advised  patient of importance of notifying provider of falls. The patient states he had a fall "3 weeks" ago and he does not remember why he fell. He sees the pcp on 05-14-2022 for follow up. Education on reporting new falls.  Assessed for signs and symptoms of orthostatic hypotension. The patient is having elevation in blood pressures. Denies any headaches.  Assessed for falls since last encounter. Last fall occurred "3 weeks ago" per the patient. Education and fall prevention reviewed.  Assessed patients knowledge of fall risk prevention secondary to previously provided education Provided patient information for fall alert systems Assessed working status of life alert bracelet and patient adherence Advised patient to discuss changes in mobility and new falls  with provider. Patient will have a follow up with the pcp on 05-14-2022. Review of discussing changes in his condition with the pcp. The patient has a history of high fall risk. Education provided.  Screening for signs and symptoms of depression related to chronic disease state Assessed social determinant of health barriers  Symptom Management: Take medications as prescribed   Attend all scheduled provider appointments Call provider office for new concerns or questions  call the Suicide and Crisis Lifeline: 988 call the [place] National Suicide Prevention Lifeline: 289 516 7867 or TTY: 517-079-4715 TTY 415 648 1302) to talk to a trained counselor call 1-800-273-TALK (toll free, 24 hour hotline) if experiencing a Mental Health or [place]   Follow Up Plan: Telephone follow up appointment with care management team member scheduled for: 06-22-2022 at 1145 am          Plan:Telephone follow up appointment with care management team member scheduled for:  06-22-2022 at 1145 am  Noreene Larsson RN, MSN, CCM RN Care Manager  Chronic Care Management Direct Number: [phone]

## 2022-05-11 NOTE — Telephone Encounter (Signed)
Note routed to JP as she spoke to pt last week and today.

## 2022-05-11 NOTE — Telephone Encounter (Signed)
Pt called want RX for burning urine; Ciprofloxacin, Dr Linde Gillis in [place] wrote it.  Needs another RX please. Pt has 2 days left.  Patient has burning urine. Please advise Justin Griffith [phone].

## 2022-05-12 NOTE — Telephone Encounter (Signed)
Please call patient and reschedule appts from [facility] to Dr. Doristine Counter schedule for around the same time in March. Thanks

## 2022-05-13 NOTE — Telephone Encounter (Signed)
See previous encounter

## 2022-05-14 ENCOUNTER — Encounter: Payer: Self-pay | Admitting: Surgery

## 2022-05-14 ENCOUNTER — Ambulatory Visit (INDEPENDENT_AMBULATORY_CARE_PROVIDER_SITE_OTHER): Payer: PPO | Admitting: Family Medicine

## 2022-05-14 ENCOUNTER — Ambulatory Visit (INDEPENDENT_AMBULATORY_CARE_PROVIDER_SITE_OTHER): Payer: HMO | Admitting: Surgery

## 2022-05-14 ENCOUNTER — Encounter: Payer: Self-pay | Admitting: Family Medicine

## 2022-05-14 VITALS — BP 174/81 | HR 87 | Temp 98.2°F

## 2022-05-14 VITALS — BP 172/89 | HR 92 | Temp 98.0°F | Ht 71.0 in | Wt 250.0 lb

## 2022-05-14 DIAGNOSIS — B372 Candidiasis of skin and nail: Secondary | ICD-10-CM | POA: Diagnosis not present

## 2022-05-14 DIAGNOSIS — R300 Dysuria: Secondary | ICD-10-CM | POA: Diagnosis not present

## 2022-05-14 DIAGNOSIS — L02212 Cutaneous abscess of back [any part, except buttock]: Secondary | ICD-10-CM

## 2022-05-14 DIAGNOSIS — I129 Hypertensive chronic kidney disease with stage 1 through stage 4 chronic kidney disease, or unspecified chronic kidney disease: Secondary | ICD-10-CM

## 2022-05-14 DIAGNOSIS — L723 Sebaceous cyst: Secondary | ICD-10-CM | POA: Diagnosis not present

## 2022-05-14 LAB — MICROSCOPIC EXAMINATION
Bacteria, UA: NONE SEEN
Epithelial Cells (non renal): NONE SEEN /hpf (ref 0–10)

## 2022-05-14 LAB — URINALYSIS, ROUTINE W REFLEX MICROSCOPIC
Bilirubin, UA: NEGATIVE
Ketones, UA: NEGATIVE
Leukocytes,UA: NEGATIVE
Nitrite, UA: NEGATIVE
Specific Gravity, UA: 1.01 (ref 1.005–1.030)
Urobilinogen, Ur: 0.2 mg/dL (ref 0.2–1.0)
pH, UA: 5.5 (ref 5.0–7.5)

## 2022-05-14 MED ORDER — NYSTATIN 100000 UNIT/GM EX OINT: 1.0000 | TOPICAL_OINTMENT | Freq: Two times a day (BID) | CUTANEOUS | 0 refills | Status: DC

## 2022-05-14 MED ORDER — CEPHALEXIN 500 MG PO CAPS: 500.0000 mg | ORAL_CAPSULE | Freq: Four times a day (QID) | ORAL | 0 refills | Status: AC

## 2022-05-14 NOTE — Telephone Encounter (Signed)
LM informing of appt change.

## 2022-05-14 NOTE — Progress Notes (Signed)
Patient ID: Justin Griffith, male   DOB: 07/21/1940, 82 y.o.   MRN: [id number]  Chief Complaint: Abscessed/inflamed sebaceous cyst  History of Present Illness Justin Griffith is a 82 y.o. male with a back cyst that became more prominent about a week to 10 days ago.  Increased pain with swelling.  Takes Eliquis.  Recently seen by PCP felt to be too large, urgent referral to [place] for I&D.  Past Medical History Past Medical History:  Diagnosis Date   Acute urinary retention    Arthritis    Asthma 2000   Back pain    Colon polyp 2012   COPD (chronic obstructive pulmonary disease) (HCC)    Emphysema of lung ([place])    Heart murmur    Hernia 2000   History of cold sores 05/06/2020   History of colon cancer    Hyperlipidemia    Hypertension    Personal history of malignant neoplasm of large intestine    Personal history of tobacco use, presenting hazards to health    Septic shock ([place]) 11/18/2021   Sleep apnea    Special screening for malignant neoplasms, colon    Type 2 diabetes mellitus with proteinuria ([place]) 10/11/2014      Past Surgical History:  Procedure Laterality Date   CARDIAC CATHETERIZATION Left 09/25/2015   Procedure: Left Heart Cath and Coronary Angiography;  Surgeon: Yolonda Kida, MD;  Location: [facility];  Service: Cardiovascular;  Laterality: Left;   CHOLECYSTECTOMY  1970   COLON SURGERY  07-16-99   sigmoid colon resection with primary anastomosis, chemotherapy for metastatic disease   COLONOSCOPY  2001, 2012   Dr Bary Castilla, tubular adenoma of the cecum and ascending colon in 2012.   COLONOSCOPY WITH PROPOFOL N/A 02/12/2016   Procedure: COLONOSCOPY WITH PROPOFOL;  Surgeon: Robert Bellow, MD;  Location: [facility];  Service: Endoscopy;  Laterality: N/A;   CORONARY STENT INTERVENTION N/A 05/17/2020   Procedure: CORONARY STENT INTERVENTION;  Surgeon: Yolonda Kida, MD;  Location: [facility];  Service: Cardiovascular;  Laterality: N/A;  LAD    ESOPHAGOGASTRODUODENOSCOPY (EGD) WITH PROPOFOL N/A 02/12/2016   Procedure: ESOPHAGOGASTRODUODENOSCOPY (EGD) WITH PROPOFOL;  Surgeon: Robert Bellow, MD;  Location: [facility] ENDOSCOPY;  Service: Endoscopy;  Laterality: N/A;   HERNIA REPAIR Right    right inguinal hernia repair   IR 3D INDEPENDENT WKST  05/07/2022   IR ANGIOGRAM PELVIS SELECTIVE OR SUPRASELECTIVE  05/07/2022   IR ANGIOGRAM PELVIS SELECTIVE OR SUPRASELECTIVE  05/07/2022   IR ANGIOGRAM SELECTIVE EACH ADDITIONAL VESSEL  05/07/2022   IR EMBO ARTERIAL NOT HEMORR HEMANG INC GUIDE ROADMAPPING  05/07/2022   IR RADIOLOGIST EVAL & MGMT  02/27/2022   IR RADIOLOGIST EVAL & MGMT  03/25/2022   IR US GUIDE VASC ACCESS LEFT  05/07/2022   JOINT REPLACEMENT Bilateral    knee replacement   KNEE SURGERY Bilateral 2010   LEFT HEART CATH AND CORONARY ANGIOGRAPHY N/A 05/17/2020   Procedure: LEFT HEART CATH AND CORONARY ANGIOGRAPHY possible PCI and stent;  Surgeon: Yolonda Kida, MD;  Location: [facility];  Service: Cardiovascular;  Laterality: N/A;   MYRINGOTOMY WITH TUBE PLACEMENT Bilateral 08/16/2014   Procedure: MYRINGOTOMY WITH TUBE PLACEMENT;  Surgeon: Carloyn Manner, MD;  Location: [facility];  Service: ENT;  Laterality: Bilateral;   PERIPHERAL VASCULAR CATHETERIZATION Right 09/04/2015   Procedure: Lower Extremity Angiography;  Surgeon: Katha Cabal, MD;  Location: [facility];  Service: Cardiovascular;  Laterality: Right;  PERIPHERAL VASCULAR CATHETERIZATION  09/04/2015   Procedure: Lower Extremity Intervention;  Surgeon: Katha Cabal, MD;  Location: [facility];  Service: Cardiovascular;;   TONSILLECTOMY     TYMPANOSTOMY TUBE PLACEMENT      Allergies  Allergen Reactions   Farxiga [Dapagliflozin] Rash    Causes a severe rash in groin area    Dulaglutide Diarrhea and Other (See Comments)   Liraglutide Diarrhea and Other (See Comments)   Jardiance [Empagliflozin] Rash    Current Outpatient Medications   Medication Sig Dispense Refill   acyclovir (ZOVIRAX) 400 MG tablet Take 1 tablet (400 mg total) by mouth 2 (two) times daily. 180 tablet 1   apixaban (ELIQUIS) 2.5 MG TABS tablet Take 1 tablet (2.5 mg total) by mouth 2 (two) times daily. For blood thinner 180 tablet 1   blood glucose meter kit and supplies 1 each by Other route daily. Dispense based on patient and insurance preference. Use up to four times daily as directed. (FOR ICD-10 E10.9, E11.9). 1 each 0   cephALEXin (KEFLEX) 500 MG capsule Take 1 capsule (500 mg total) by mouth 4 (four) times daily for 10 days. 40 capsule 0   ciprofloxacin (CIPRO) 500 MG tablet Take 1 tablet (500 mg total) by mouth 2 (two) times daily for 7 days. 14 tablet 0   cloNIDine (CATAPRES) 0.1 MG tablet Take 1 tablet (0.1 mg total) by mouth 2 (two) times daily. For blood pressure 180 tablet 1   diclofenac Sodium (VOLTAREN) 1 % GEL Apply 4 g topically 4 (four) times daily. 150 g 3   gabapentin (NEURONTIN) 300 MG capsule Take 1 capsule (300 mg total) by mouth 3 (three) times daily. For nerve pain and itching 270 capsule 3   GNP VITAMIN C 500 MG tablet TAKE 1 TABLET BY MOUTH ONCE DAILY TO KEEP YOUR IMMUNE SYSTEM UP 30 tablet 0   hydrALAZINE (APRESOLINE) 100 MG tablet Take 1 tablet (100 mg total) by mouth 3 (three) times daily. for blood pressure 270 tablet 1   losartan (COZAAR) 100 MG tablet Take 1 tablet (100 mg total) by mouth daily. For blood pressure 90 tablet 1   Multiple Vitamin (MULTIVITAMIN WITH MINERALS) TABS tablet Take 1 tablet by mouth daily. For nutrition 30 tablet 2   nystatin (MYCOSTATIN/NYSTOP) powder Apply 1 Application topically 3 (three) times daily. 60 g 3   nystatin ointment (MYCOSTATIN) Apply 1 Application topically 2 (two) times daily. 30 g 0   Olopatadine HCl 0.2 % SOLN Apply 1 drop to eye daily. 2.5 mL 3   omeprazole (PRILOSEC) 40 MG capsule Take 1 capsule (40 mg total) by mouth daily. For heartburn 90 capsule 1   phenazopyridine (PYRIDIUM) 100  MG tablet Take 1 tablet (100 mg total) by mouth 3 (three) times daily as needed for up to 7 days for pain. 21 tablet 0   polyethylene glycol powder (GLYCOLAX/MIRALAX) 17 GM/SCOOP powder Take 17 g by mouth 2 (two) times daily as needed. 3350 g 1   rosuvastatin (CRESTOR) 10 MG tablet Take 1 tablet (10 mg total) by mouth at bedtime. For cholesterol 90 tablet 1   Semaglutide (RYBELSUS) 3 MG TABS Take 3 mg by mouth daily. 30 tablet 0   Semaglutide (RYBELSUS) 7 MG TABS Take 1 tablet (7 mg total) by mouth daily. 30 tablet 3   sodium hypochlorite (DAKIN'S 1/2 STRENGTH) external solution Apply 1 Application topically daily at 12 noon. Topically to wound daily to clean.     Torsemide 40  MG TABS Take by mouth.     umeclidinium bromide (INCRUSE ELLIPTA) 62.5 MCG/ACT AEPB Inhale 1 puff into the lungs daily. For your breathing 30 each 3   Zinc Oxide 10 % OINT Apply topically.     No current facility-administered medications for this visit.    Family History Family History  Problem Relation Age of Onset   Diabetes Father    Esophageal cancer Mother    Alzheimer's disease Paternal Uncle       Social History Social History   Tobacco Use   Smoking status: Former    Packs/day: 1.00    Years: 30.00    Total pack years: 30.00    Types: Cigarettes    Quit date: 03/23/1989    Years since quitting: 33.1    Passive exposure: Past   Smokeless tobacco: Never  Vaping Use   Vaping Use: Never used  Substance Use Topics   Alcohol use: No    Alcohol/week: 0.0 standard drinks of alcohol   Drug use: No        Review of Systems  Constitutional: Negative.   HENT:  Positive for hearing loss.   Eyes: Negative.   Respiratory:  Positive for cough.   Cardiovascular: Negative.   Gastrointestinal: Negative.   Genitourinary:        Foley catheter present, temporary for urinary retention.  Skin: Negative.   Neurological: Negative.   Psychiatric/Behavioral: Negative.       Physical Exam Blood pressure  (!) 172/89, pulse 92, temperature 98 F (36.7 C), height 5' 11"$  (1.803 m), weight 250 lb (113.4 kg), SpO2 99 %. Last Weight  Most recent update: 05/14/2022  1:09 PM    Weight  113.4 kg (250 lb)             CONSTITUTIONAL: Well developed, and obese, appropriately responsive and aware without distress.   EYES: Sclera non-icteric.   EARS, NOSE, MOUTH AND THROAT:  The oropharynx is clear. Oral mucosa is pink and moist.    Hearing is intact to voice.  NECK: Trachea is midline, and there is no jugular venous distension.  LYMPH NODES:  Lymph nodes in the neck are not appreciated. RESPIRATORY:   Normal respiratory effort without pathologic use of accessory muscles. CARDIOVASCULAR:  Well perfused.  GI: The abdomen is  soft, nontender, and nondistended.  MUSCULOSKELETAL:  Symmetrical muscle tone appreciated in all four extremities.    SKIN: Skin turgor is normal. No pathologic skin lesions appreciated.  Lower mid back there is a reddened area of chronic hyperemia approximately 6 to 8 cm in diameter with a central punctum near to the right.  Markedly tender.  Indurated without fluctuance. NEUROLOGIC:  Motor and sensation appear grossly normal.  Cranial nerves are grossly without defect. PSYCH:  Alert and oriented to person, place and time. Affect is appropriate for situation.  Data Reviewed I have personally reviewed what is currently available of the patient's imaging, recent labs and medical records.   Labs:     Latest Ref Rng & Units 05/07/2022    8:23 AM 04/02/2022    6:38 AM 04/01/2022    7:30 PM  CBC  WBC 4.0 - 10.5 K/uL 8.7  7.6  6.7   Hemoglobin 13.0 - 17.0 g/dL 9.1  11.4  11.6   Hematocrit 39.0 - 52.0 % 29.2  35.3  36.0   Platelets 150 - 400 K/uL 385  273  297       Latest Ref Rng & Units 05/07/2022  8:23 AM 04/21/2022   10:32 AM 04/02/2022    6:38 AM  CMP  Glucose 70 - 99 mg/dL 312  276  243   BUN 8 - 23 mg/dL 24  19  27   $ Creatinine 0.61 - 1.24 mg/dL 1.45  1.32  1.30    Sodium 135 - 145 mmol/L 134  134  137   Potassium 3.5 - 5.1 mmol/L 3.7  4.2  3.6   Chloride 98 - 111 mmol/L 95  95  99   CO2 22 - 32 mmol/L 27  25  26   $ Calcium 8.9 - 10.3 mg/dL 9.3  9.5  9.7   Total Protein 6.5 - 8.1 g/dL 7.3   7.5   Total Bilirubin 0.3 - 1.2 mg/dL 0.5   0.5   Alkaline Phos 38 - 126 U/L 62   57   AST 15 - 41 U/L 12   15   ALT 0 - 44 U/L 8   10       Imaging: Radiological images reviewed:   Within last 24 hrs: No results found.  Assessment    Infected/inflamed sebaceous cyst lower mid back Patient Active Problem List   Diagnosis Date Noted   Senile purpura ([place]) 04/09/2022   Fever blister 03/20/2022   Iron deficiency anemia 03/11/2022   Normocytic anemia 03/10/2022   Mild dementia without behavioral disturbance, psychotic disturbance, mood disturbance, or anxiety ([place]) 02/24/2022   Benign hypertensive renal disease 02/04/2022   Pressure injury of sacral region, unstageable ([place])    Acute kidney injury superimposed on chronic kidney disease ([place]) 11/18/2021   HLD (hyperlipidemia) 10/31/2021   Type II diabetes mellitus with renal manifestations ([place]) 10/31/2021   CAD (coronary artery disease) 10/31/2021   Chronic diastolic CHF (congestive heart failure) ([place]) 10/31/2021   Right elbow pain 10/15/2021   Gross hematuria 10/12/2021   Chronic anticoagulation 10/12/2021   Stage 3b chronic kidney disease (CKD) ([place]) 10/12/2021   History of pulmonary embolism 09/18/21 10/06/2021   Generalized weakness 06/25/2021   Lymphedema 03/17/2021   Falls frequently 11/28/2020   Aortic atherosclerosis ([place]) 06/12/2020   History of embolic stroke [id number]   At high risk for falls 05/08/2020   Hyponatremia 03/29/2020   History of non-ST elevation myocardial infarction (NSTEMI) 03/28/2020   Elevated TSH 02/25/2020   History of CVA (cerebrovascular accident) 01/23/2020   Chronic venous insufficiency 05/23/2019   B12 deficiency 04/23/2019   Tremor 11/14/2018   Obesity  (BMI 30-39.9) 09/07/2017   Chronic obstructive pulmonary disease (COPD) ([place]) 04/29/2017   Intertrigo 02/02/2017   BPH with obstruction/lower urinary tract symptoms Foley dependent 07/23/2016   Advanced care planning/counseling discussion [id number]   Eosinophilic esophagitis [id number]   PAD (peripheral artery disease) ([place]) 01/08/2016   GERD without esophagitis 01/06/2016   Hyperlipidemia associated with type 2 diabetes mellitus ([place]) 01/16/2015   Sleep apnea 01/16/2015    Plan    Incision and drainage complex infected/inflamed sebaceous cyst/abscess.  Pre-operative Diagnosis: Infected/inflamed sebaceous cyst lower mid back.  Post-operative Diagnosis: same.    Surgeon: Ronny Bacon, M.D., FACS  Anesthesia: Local   Findings: Deep sebaceous cyst with some purulent drainage.  Marked degree of bloody oozing from inflammatory changes and Eliquis.  Estimated Blood Loss: 10 mL         Specimens: Skin with punctum, anterior aspect of cyst, some seborrheic material removed as tolerated.          Complications: none  Procedure Details  The patient was evaluated, the benefits, complications, treatment options, and expected outcomes were discussed with the patient. The risks of bleeding, infection, recurrence of symptoms, failure to resolve symptoms, unanticipated injury, any of which could require further surgery were reviewed with the patient. The likelihood of improving the patient's symptoms with return to their baseline status is expected.  The patient and/or family concurred with the proposed plan, giving informed consent.  The patient was taken to our procedure room, identified and the procedure verified.    The patient was positioned in the sitting position and the back was prepped with  Chloraprep and draped in the sterile fashion.  A Time Out was held and the above information confirmed.  Local infiltration of 1% lidocaine with epinephrine is supplied to the  area, local utilized was also diluted with bicarbonate.  An elliptical incision was made to include the punctum and provide adequate access to the underlying cavity.  This was completed with an 11 blade.  Patient remains quite sensitive and could not tolerate too much more in terms of debridement, cyst extraction, even packing.  I proceeded with quick packing of the wound, securing cover dressing.  Patient was then given instructions along with his wife present.  They are to maintain this dressing for approximately 36 hours.  I written a prescription for Keflex.  They are to remove the dressing and bathe and replace the dressing.  I warned them to expect some degree of bloody oozing.  He is to hold his Eliquis for the next 3 days until he begins his dressing changes.  Face-to-face time spent with the patient and accompanying care providers(if present) was 45 minutes, with more than 50% of the time spent counseling, educating, and coordinating care of the patient.    These notes generated with voice recognition software. I apologize for typographical errors.  Ronny Bacon M.D., FACS 05/14/2022, 10:36 PM

## 2022-05-14 NOTE — Patient Instructions (Addendum)
Stop taking your Eliquis for 3 days to help minimize any bleeding.   Take Extra strength Tylenol, 2 every 6-8 hours as needed for pain.   On Saturday, remove your dressing and the packing. Take new packing material and fully fill the wound that we made. (About 1.5 feet long)Then place several gauze pads over the wound and tape in place.  If the dressing becomes saturated before Saturday place more packing over it and tape in place.   After Saturday you will need to change the packing every day.   We have sent in a prescription for antibiotics to your pharmacy. Please fill this and take it all.   Follow up here next week.     Call us with any questions.

## 2022-05-14 NOTE — Assessment & Plan Note (Signed)
Has not gotten his higher dose of hydralazine with his next pill pack- due to start 05/22/22. Will follow up BP about 2 weeks after that.

## 2022-05-14 NOTE — Progress Notes (Signed)
BP (!) 174/81   Pulse 87   Temp 98.2 F (36.8 C) (Oral)   SpO2 98%    Subjective:    Patient ID: Justin Griffith, male    DOB: 1940/08/18, 82 y.o.   MRN: [id number]  HPI: Justin Griffith is a 82 y.o. male  Chief Complaint  Patient presents with   Dysuria    Patient says when he urinates, patient says it is burning.    Rectal Pain   Cyst   Did well with his prostate artery embolization. Felt well. Had a great experience with his surgery. Due to see Dr. Chrystine Oiler again in March. He has been having some problems with burning when he pees.   URINARY SYMPTOMS Duration: since surgery about a week and a half ago Dysuria: burning Urinary frequency: no Urgency: no Small volume voids: no Symptom severity: no Urinary incontinence: no Foul odor: no Hematuria: no Abdominal pain: yes Back pain: yes Suprapubic pain/pressure: yes Flank pain: no Fever:  no Vomiting: no Relief with cranberry juice: no Relief with pyridium: no Status: stable Previous urinary tract infection: yes History of sexually transmitted disease: no Penile discharge: no Treatments attempted: cipro   LUMP Duration: about a week Location: lower back Onset: sudden Painful: yes Discomfort: yes Status:  bigger Trauma: no Redness: yes Bruising: no Recent infection: no Swollen lymph nodes: no Requesting removal: yes History of cancer: no Family history of cancer: no History of the same: no Associated signs and symptoms:   Relevant past medical, surgical, family and social history reviewed and updated as indicated. Interim medical history since our last visit reviewed. Allergies and medications reviewed and updated.  Review of Systems  Constitutional: Negative.   Respiratory: Negative.    Cardiovascular: Negative.   Gastrointestinal: Negative.   Genitourinary:  Positive for dysuria. Negative for decreased urine volume, difficulty urinating, enuresis, flank pain, frequency, genital sores, hematuria,  penile discharge, penile pain, penile swelling, scrotal swelling, testicular pain and urgency.  Musculoskeletal: Negative.   Skin:  Positive for color change. Negative for pallor, rash and wound.  Neurological: Negative.   Psychiatric/Behavioral: Negative.      Per HPI unless specifically indicated above     Objective:    BP (!) 174/81   Pulse 87   Temp 98.2 F (36.8 C) (Oral)   SpO2 98%   Wt Readings from Last 3 Encounters:  05/07/22 251 lb 1.6 oz (113.9 kg)  04/30/22 251 lb 1.6 oz (113.9 kg)  04/21/22 252 lb 6.4 oz (114.5 kg)    Physical Exam Vitals and nursing note reviewed.  Constitutional:      General: He is not in acute distress.    Appearance: Normal appearance. He is obese. He is not ill-appearing, toxic-appearing or diaphoretic.  HENT:     Head: Normocephalic and atraumatic.     Right Ear: External ear normal.     Left Ear: External ear normal.     Nose: Nose normal.     Mouth/Throat:     Mouth: Mucous membranes are moist.     Pharynx: Oropharynx is clear.  Eyes:     General: No scleral icterus.       Right eye: No discharge.        Left eye: No discharge.     Extraocular Movements: Extraocular movements intact.     Conjunctiva/sclera: Conjunctivae normal.     Pupils: Pupils are equal, round, and reactive to light.  Cardiovascular:     Rate and Rhythm: Normal  rate and regular rhythm.     Pulses: Normal pulses.     Heart sounds: Normal heart sounds. No murmur heard.    No friction rub. No gallop.  Pulmonary:     Effort: Pulmonary effort is normal. No respiratory distress.     Breath sounds: Normal breath sounds. No stridor. No wheezing, rhonchi or rales.  Chest:     Chest wall: No tenderness.  Genitourinary:    Comments: Foley in place Musculoskeletal:        General: Normal range of motion.     Cervical back: Normal range of motion and neck supple.     Comments: Uses a walker  Skin:    General: Skin is warm and dry.     Capillary Refill: Capillary  refill takes less than 2 seconds.     Coloration: Skin is not jaundiced or pale.     Findings: Erythema (no open wound between buttocks, well demarcated redness) present. No bruising, lesion or rash.     Comments: 4 inch fluctuant red abscess on lower back  Neurological:     General: No focal deficit present.     Mental Status: He is alert and oriented to person, place, and time. Mental status is at baseline.  Psychiatric:        Mood and Affect: Mood normal.        Behavior: Behavior normal.        Thought Content: Thought content normal.        Judgment: Judgment normal.     Results for orders placed or performed during the hospital encounter of 05/07/22  CBC with Differential/Platelet  Result Value Ref Range   WBC 8.7 4.0 - 10.5 K/uL   RBC 3.40 (L) 4.22 - 5.81 MIL/uL   Hemoglobin 9.1 (L) 13.0 - 17.0 g/dL   HCT 29.2 (L) 39.0 - 52.0 %   MCV 85.9 80.0 - 100.0 fL   MCH 26.8 26.0 - 34.0 pg   MCHC 31.2 30.0 - 36.0 g/dL   RDW 13.7 11.5 - 15.5 %   Platelets 385 150 - 400 K/uL   nRBC 0.0 0.0 - 0.2 %   Neutrophils Relative % 72 %   Neutro Abs 6.4 1.7 - 7.7 K/uL   Lymphocytes Relative 14 %   Lymphs Abs 1.2 0.7 - 4.0 K/uL   Monocytes Relative 9 %   Monocytes Absolute 0.8 0.1 - 1.0 K/uL   Eosinophils Relative 3 %   Eosinophils Absolute 0.2 0.0 - 0.5 K/uL   Basophils Relative 1 %   Basophils Absolute 0.1 0.0 - 0.1 K/uL   Immature Granulocytes 1 %   Abs Immature Granulocytes 0.06 0.00 - 0.07 K/uL  Protime-INR  Result Value Ref Range   Prothrombin Time 14.4 11.4 - 15.2 seconds   INR 1.1 0.8 - 1.2  Comprehensive metabolic panel  Result Value Ref Range   Sodium 134 (L) 135 - 145 mmol/L   Potassium 3.7 3.5 - 5.1 mmol/L   Chloride 95 (L) 98 - 111 mmol/L   CO2 27 22 - 32 mmol/L   Glucose, Bld 312 (H) 70 - 99 mg/dL   BUN 24 (H) 8 - 23 mg/dL   Creatinine, Ser 1.45 (H) 0.61 - 1.24 mg/dL   Calcium 9.3 8.9 - 10.3 mg/dL   Total Protein 7.3 6.5 - 8.1 g/dL   Albumin 3.0 (L) 3.5 - 5.0  g/dL   AST 12 (L) 15 - 41 U/L   ALT 8 0 - 44 U/L  Alkaline Phosphatase 62 38 - 126 U/L   Total Bilirubin 0.5 0.3 - 1.2 mg/dL   GFR, Estimated 48 (L) >60 mL/min   Anion gap 12 5 - 15      Assessment & Plan:   Problem List Items Addressed This Visit       Genitourinary   Benign hypertensive renal disease    Has not gotten his higher dose of hydralazine with his next pill pack- due to start 05/22/22. Will follow up BP about 2 weeks after that.       Other Visit Diagnoses     Abscess of lower back    -  Primary   4 inches in diameter, fluctuant. Given size, we will get him into surgery ASAP.   Relevant Orders   Ambulatory referral to General Surgery   Candidal intertrigo       Will treat with nystatin ointment. Call with any concerns.   Relevant Medications   nystatin ointment (MYCOSTATIN)   Dysuria       Will check urine. Continue to monitor. Call with any concerns. Continue to follow with urology.   Relevant Orders   Urinalysis, Routine w reflex microscopic   Urine Culture        Follow up plan: Return Next week, OK to double book if needed.

## 2022-05-18 ENCOUNTER — Telehealth: Payer: Self-pay | Admitting: Family Medicine

## 2022-05-18 NOTE — Telephone Encounter (Signed)
Spoke with Adela Lank from [facility] and provided OK for verbal orders per Dr Wynetta Emery. Shelia verbalized understanding.

## 2022-05-18 NOTE — Telephone Encounter (Signed)
Justin Griffith  with [facility] is needing a nurse evaluation for pt. Pt has a new surgical wound. Pt had a  cysts removed and is needing daily packing.    Please advise

## 2022-05-18 NOTE — Telephone Encounter (Signed)
OK for verbal orders if they can take them

## 2022-05-19 ENCOUNTER — Telehealth: Payer: Self-pay | Admitting: Family Medicine

## 2022-05-19 NOTE — Telephone Encounter (Signed)
I will look at it and see if I can change the dressing tomorrow.

## 2022-05-19 NOTE — Telephone Encounter (Addendum)
Justin Griffith from [facility] states that pt fell last night and hit his left arm and there is swelling on the left arm and it is hard for pt to get up. Pt states that his pain is a 7 out of 10.  Pt had a cyst removed last Thursday on his back and is wanting to see if PCP can change his dressing at his appt tomorrow.

## 2022-05-20 ENCOUNTER — Encounter: Payer: Self-pay | Admitting: Family Medicine

## 2022-05-20 ENCOUNTER — Ambulatory Visit (INDEPENDENT_AMBULATORY_CARE_PROVIDER_SITE_OTHER): Payer: PPO | Admitting: Family Medicine

## 2022-05-20 VITALS — BP 182/84 | HR 88 | Temp 98.1°F

## 2022-05-20 DIAGNOSIS — R252 Cramp and spasm: Secondary | ICD-10-CM

## 2022-05-20 DIAGNOSIS — L02212 Cutaneous abscess of back [any part, except buttock]: Secondary | ICD-10-CM

## 2022-05-20 DIAGNOSIS — M7022 Olecranon bursitis, left elbow: Secondary | ICD-10-CM

## 2022-05-20 MED ORDER — GABAPENTIN 400 MG PO CAPS: 400.0000 mg | ORAL_CAPSULE | Freq: Three times a day (TID) | ORAL | 1 refills | Status: DC

## 2022-05-20 NOTE — Progress Notes (Signed)
BP (!) 182/84   Pulse 88   Temp 98.1 F (36.7 C) (Oral)   SpO2 96%    Subjective:    Patient ID: Justin Griffith, male    DOB: 1940/08/04, 82 y.o.   MRN: [id number]  HPI: Justin Griffith is a 82 y.o. male  Chief Complaint  Patient presents with   Wound Check   Fall    Patient fell the other day and says he hurt his L elbow and says it is sore.    ABSCESS Duration: about 2 weeks Location: low back Pain:  yes Quality:  sore Severity: mild Redness:  no Swelling:  no Warmth:  no Oozing:  no Pus:  yes Treatments attempted:I&D with general surgery Past similar infections:  no Past MRSA skin infections:  no History of trauma in area:  yes Fevers:  no Nausea/vomiting:  no  Has been having more trouble with tingling. Would like to go up on his gabapentin.  ARM PAIN Duration: couple of days Location: L elbow Mechanism of injury:  fall Onset: sudden Severity: mild  Quality:  aching and sore Frequency: constant Radiation: no Aggravating factors: nothing  Alleviating factors: nothing  Status: stable Treatments attempted: rest, ice, and heat  Relief with NSAIDs?:  No NSAIDs Taken Swelling: yes Redness: no  Warmth: no Trauma: no Chest pain: no  Shortness of breath: no  Fever: no Decreased sensation: no Paresthesias: no Weakness: no  Relevant past medical, surgical, family and social history reviewed and updated as indicated. Interim medical history since our last visit reviewed. Allergies and medications reviewed and updated.  Review of Systems  Constitutional: Negative.   Respiratory: Negative.    Cardiovascular: Negative.   Musculoskeletal: Negative.   Skin:  Positive for wound. Negative for color change, pallor and rash.  Neurological: Negative.   Psychiatric/Behavioral: Negative.      Per HPI unless specifically indicated above     Objective:    BP (!) 182/84   Pulse 88   Temp 98.1 F (36.7 C) (Oral)   SpO2 96%   Wt Readings from Last 3  Encounters:  05/21/22 250 lb (113.4 kg)  05/14/22 250 lb (113.4 kg)  05/07/22 251 lb 1.6 oz (113.9 kg)    Physical Exam Vitals and nursing note reviewed.  Constitutional:      General: He is not in acute distress.    Appearance: Normal appearance. He is not ill-appearing, toxic-appearing or diaphoretic.  HENT:     Head: Normocephalic and atraumatic.     Right Ear: External ear normal.     Left Ear: External ear normal.     Nose: Nose normal.     Mouth/Throat:     Mouth: Mucous membranes are moist.     Pharynx: Oropharynx is clear.  Eyes:     General: No scleral icterus.       Right eye: No discharge.        Left eye: No discharge.     Extraocular Movements: Extraocular movements intact.     Conjunctiva/sclera: Conjunctivae normal.     Pupils: Pupils are equal, round, and reactive to light.  Cardiovascular:     Rate and Rhythm: Normal rate and regular rhythm.     Pulses: Normal pulses.     Heart sounds: Normal heart sounds. No murmur heard.    No friction rub. No gallop.  Pulmonary:     Effort: Pulmonary effort is normal. No respiratory distress.     Breath sounds: Normal breath  sounds. No stridor. No wheezing, rhonchi or rales.  Chest:     Chest wall: No tenderness.  Musculoskeletal:        General: Normal range of motion.     Cervical back: Normal range of motion and neck supple.  Skin:    General: Skin is warm and dry.     Capillary Refill: Capillary refill takes less than 2 seconds.     Coloration: Skin is not jaundiced or pale.     Findings: No bruising, erythema, lesion or rash.     Comments: Open wound with packing in center of back, no redness or swelling, mild pain  Neurological:     General: No focal deficit present.     Mental Status: He is alert and oriented to person, place, and time. Mental status is at baseline.  Psychiatric:        Mood and Affect: Mood normal.        Behavior: Behavior normal.        Thought Content: Thought content normal.         Judgment: Judgment normal.     Results for orders placed or performed in visit on 05/14/22  Urine Culture   Specimen: Urine   UR  Result Value Ref Range   Urine Culture, Routine Final report (A)    Organism ID, Bacteria Comment (A)    Antimicrobial Susceptibility Comment   Microscopic Examination   Urine  Result Value Ref Range   WBC, UA 0-5 0 - 5 /hpf   RBC, Urine 0-2 0 - 2 /hpf   Epithelial Cells (non renal) None seen 0 - 10 /hpf   Mucus, UA Present (A) Not Estab.   Bacteria, UA None seen None seen/Few  Urinalysis, Routine w reflex microscopic  Result Value Ref Range   Specific Gravity, UA 1.010 1.005 - 1.030   pH, UA 5.5 5.0 - 7.5   Color, UA Yellow Yellow   Appearance Ur Clear Clear   Leukocytes,UA Negative Negative   Protein,UA 3+ (A) Negative/Trace   Glucose, UA 3+ (A) Negative   Ketones, UA Negative Negative   RBC, UA Trace (A) Negative   Bilirubin, UA Negative Negative   Urobilinogen, Ur 0.2 0.2 - 1.0 mg/dL   Nitrite, UA Negative Negative   Microscopic Examination See below:       Assessment & Plan:   Problem List Items Addressed This Visit   None Visit Diagnoses     Abscess of lower back    -  Primary   Healing well. Continue to follow with surgery and home nurse. Call with any concerns.   Olecranon bursitis of left elbow       Will give a little longer to resolve. If not better next week, we will drain.   Leg cramps       Will increase his gabapentin to '400mg'$  TID. Called over to [facility]. Recheck 1 week. Call with any concerns.        Follow up plan: Return in about 1 week (around 05/27/2022) for OK to double book if needed.

## 2022-05-21 ENCOUNTER — Ambulatory Visit (INDEPENDENT_AMBULATORY_CARE_PROVIDER_SITE_OTHER): Payer: HMO | Admitting: Surgery

## 2022-05-21 ENCOUNTER — Other Ambulatory Visit: Payer: Self-pay | Admitting: Family Medicine

## 2022-05-21 ENCOUNTER — Encounter: Payer: Self-pay | Admitting: Surgery

## 2022-05-21 VITALS — BP 167/70 | HR 92 | Temp 98.0°F | Ht 71.0 in | Wt 250.0 lb

## 2022-05-21 DIAGNOSIS — E1122 Type 2 diabetes mellitus with diabetic chronic kidney disease: Secondary | ICD-10-CM

## 2022-05-21 DIAGNOSIS — J449 Chronic obstructive pulmonary disease, unspecified: Secondary | ICD-10-CM

## 2022-05-21 DIAGNOSIS — I5032 Chronic diastolic (congestive) heart failure: Secondary | ICD-10-CM

## 2022-05-21 DIAGNOSIS — T8189XA Other complications of procedures, not elsewhere classified, initial encounter: Secondary | ICD-10-CM

## 2022-05-21 LAB — URINE CULTURE

## 2022-05-21 NOTE — Telephone Encounter (Signed)
Mandy with [facility] states that their system is down and is needing a verbal ok to refill pt's medication  gabapentin (NEURONTIN) 400 MG capsule .   Leafy Ro is requesting a call back.

## 2022-05-21 NOTE — Patient Instructions (Signed)
Remove the packing and change this every day. Place new gauze over the top and tape in place.  As the area gets smaller you will use less and less packing.   Follow up here in 3 weeks.

## 2022-05-21 NOTE — Progress Notes (Signed)
He follows up today to evaluate his back wound.  He has been getting addressed at his primary care office.  Wound is progressing nicely.  On my exam today there is a small amount of seborrheic material and cyst in the wound making a whitish area within the otherwise beefy red granulation.  With topical application of lidocaine jelly, I used a #3 bone curette and was able to excisionally debride the residual cystic remnant contents and capsule.  The depth of the excisional debridement was skin and subcutaneous tissues.  There is nothing left but beefy red granulation tissue that bleeds readily thanks to his Eliquis.  We repacked his incision with quarter inch plain packing strip.  We advised him to continue doing this, anticipating complete healing within the next 2 to 3 weeks.  Will see him back in 3 weeks if needed for any residual incomplete healing.  Today's wound measures approximately 3.5 cm cephalad to caudad, approximately 6 to 7 mm wide with a depth of about 4 mm.

## 2022-05-21 NOTE — Telephone Encounter (Signed)
Mandy from [facility] checking status of RX, patient hopes to get by tomorrow, their machine is donw but says can to verbal or manually fax to  [facility], [place]. Phone: [phone]  Fax: [phone]

## 2022-05-21 NOTE — Telephone Encounter (Signed)
Unable to refill per protocol, Rx request refilled 05/21/27.  Requested Prescriptions  Pending Prescriptions Disp Refills   gabapentin (NEURONTIN) 400 MG capsule 270 capsule 1    Sig: Take 1 capsule (400 mg total) by mouth 3 (three) times daily. For nerve pain and itching     Neurology: Anticonvulsants - gabapentin Failed - 05/21/2022  2:27 PM      Failed - Cr in normal range and within 360 days    Creatinine, Ser  Date Value Ref Range Status  05/07/2022 1.45 (H) 0.61 - 1.24 mg/dL Final         Passed - Completed PHQ-2 or PHQ-9 in the last 360 days      Passed - Valid encounter within last 12 months    Recent Outpatient Visits           Yesterday Abscess of lower back   [place], Megan P, DO   1 week ago Abscess of lower back   [place], Megan P, DO   3 weeks ago Benign hypertensive renal disease   [facility] [place], Megan P, DO   1 month ago Type 2 diabetes mellitus with chronic kidney disease, without long-term current use of insulin, unspecified CKD stage [facility])   [place], Megan P, DO   1 month ago Benign hypertensive renal disease   [place], Barb Merino, DO       Future Appointments             In 1 week Wynetta Emery, Barb Merino, DO [place]   In 1 week Wynetta Emery, Barb Merino, DO [place]   In 3 weeks Diamantina [place], Herbert Seta, MD [place]   In 3 weeks Diamantina [place], Herbert Seta, MD [place]

## 2022-05-25 ENCOUNTER — Telehealth: Payer: Self-pay | Admitting: Family Medicine

## 2022-05-25 NOTE — Telephone Encounter (Signed)
Patient says [facility] contacted him because they sent over a refill request for gabapentin (NEURONTIN) 400 MG capsule [id number] . Patient says they need Dr. Wynetta Emery to approve it. Please advise.

## 2022-05-25 NOTE — Telephone Encounter (Signed)
Spoke with Mandi at [facility] to provide the verbal order for patient's prescription of Gabapentin 400 MG as his prior prescription failed to go through. Mandi verbalized understanding and has no further questions.

## 2022-05-25 NOTE — Telephone Encounter (Signed)
Patient is on his last dose.

## 2022-05-25 NOTE — Telephone Encounter (Signed)
This probably didn't go through because of their computer issues- the dose is in. Please call in.

## 2022-05-25 NOTE — Addendum Note (Signed)
Encounter addended by: Wolfgang Phoenix on: 05/25/2022 10:40 AM  Actions taken: Imaging Exam ended, Charge Capture section accepted

## 2022-05-28 ENCOUNTER — Telehealth: Payer: Self-pay | Admitting: Family Medicine

## 2022-05-28 ENCOUNTER — Encounter: Payer: Self-pay | Admitting: Family Medicine

## 2022-05-28 ENCOUNTER — Ambulatory Visit (INDEPENDENT_AMBULATORY_CARE_PROVIDER_SITE_OTHER): Payer: PPO | Admitting: Family Medicine

## 2022-05-28 VITALS — BP 192/72 | HR 93 | Temp 98.1°F | Ht 71.0 in

## 2022-05-28 DIAGNOSIS — L02212 Cutaneous abscess of back [any part, except buttock]: Secondary | ICD-10-CM | POA: Diagnosis not present

## 2022-05-28 DIAGNOSIS — I69351 Hemiplegia and hemiparesis following cerebral infarction affecting right dominant side: Secondary | ICD-10-CM | POA: Diagnosis not present

## 2022-05-28 DIAGNOSIS — M7021 Olecranon bursitis, right elbow: Secondary | ICD-10-CM

## 2022-05-28 DIAGNOSIS — J449 Chronic obstructive pulmonary disease, unspecified: Secondary | ICD-10-CM | POA: Diagnosis not present

## 2022-05-28 DIAGNOSIS — I509 Heart failure, unspecified: Secondary | ICD-10-CM | POA: Diagnosis not present

## 2022-05-28 NOTE — Telephone Encounter (Signed)
Ok for verbal orders ?

## 2022-05-28 NOTE — Telephone Encounter (Signed)
Home Health Verbal Orders - Caller/Agency: Cherly Anderson / [facility]  Callback Number: [phone] vm can be left  Requesting PT   Frequency: Extension of orders  2x's a week for 2 weeks and  1x a week for 2 weeks

## 2022-05-28 NOTE — Telephone Encounter (Signed)
Spoke with Raquel Sarna from [facility] and provided verbal OK for requested orders. Raquel Sarna verbalized understanding.

## 2022-05-28 NOTE — Progress Notes (Signed)
BP (!) 192/72   Pulse 93   Temp 98.1 F (36.7 C) (Oral)   Ht '5\' 11"'$  (1.803 m)   SpO2 95%   BMI 34.87 kg/m    Subjective:    Patient ID: Justin Griffith, male    DOB: [birth year], 82 y.o.   MRN: [id number]  HPI: Justin Griffith is a 82 y.o. male  Chief Complaint  Patient presents with   Wound Check   Wound on his back is doing well. Has been being changed by home health and general surgery. Feeling well.   ARM PAIN Duration: weeks Location: bilateral elbow Mechanism of injury: unknown Onset: gradual Severity: moderate  Quality:  aching and sore Frequency: constant Radiation: no Aggravating factors: nothing  Alleviating factors: nothing  Status: stable Treatments attempted: rest, ice, heat, APAP, ibuprofen, and aleve  Relief with NSAIDs?:  no Swelling: yes Redness: no  Warmth: no Trauma: no Chest pain: no  Shortness of breath: no  Fever: no Decreased sensation: no Paresthesias: no Weakness: no   Relevant past medical, surgical, family and social history reviewed and updated as indicated. Interim medical history since our last visit reviewed. Allergies and medications reviewed and updated.  Review of Systems  Constitutional: Negative.   Respiratory: Negative.    Cardiovascular: Negative.   Gastrointestinal: Negative.   Musculoskeletal:  Positive for joint swelling. Negative for arthralgias, back pain, gait problem, myalgias, neck pain and neck stiffness.  Skin: Negative.   Neurological: Negative.   Psychiatric/Behavioral: Negative.      Per HPI unless specifically indicated above     Objective:    BP (!) 192/72   Pulse 93   Temp 98.1 F (36.7 C) (Oral)   Ht '5\' 11"'$  (1.803 m)   SpO2 95%   BMI 34.87 kg/m   Wt Readings from Last 3 Encounters:  05/21/22 250 lb (113.4 kg)  05/14/22 250 lb (113.4 kg)  05/07/22 251 lb 1.6 oz (113.9 kg)    Physical Exam Vitals and nursing note reviewed.  Constitutional:      General: He is not in acute distress.     Appearance: Normal appearance. He is not ill-appearing, toxic-appearing or diaphoretic.  HENT:     Head: Normocephalic and atraumatic.     Right Ear: External ear normal.     Left Ear: External ear normal.     Nose: Nose normal.     Mouth/Throat:     Mouth: Mucous membranes are moist.     Pharynx: Oropharynx is clear.  Eyes:     General: No scleral icterus.       Right eye: No discharge.        Left eye: No discharge.     Extraocular Movements: Extraocular movements intact.     Conjunctiva/sclera: Conjunctivae normal.     Pupils: Pupils are equal, round, and reactive to light.  Cardiovascular:     Rate and Rhythm: Normal rate and regular rhythm.     Pulses: Normal pulses.     Heart sounds: Normal heart sounds. No murmur heard.    No friction rub. No gallop.  Pulmonary:     Effort: Pulmonary effort is normal. No respiratory distress.     Breath sounds: Normal breath sounds. No stridor. No wheezing, rhonchi or rales.  Chest:     Chest wall: No tenderness.  Musculoskeletal:        General: Normal range of motion.     Cervical back: Normal range of motion and neck supple.  Comments: Olecranon bursa swelling bilaterally  Skin:    General: Skin is warm and dry.     Capillary Refill: Capillary refill takes less than 2 seconds.     Coloration: Skin is not jaundiced or pale.     Findings: No bruising, erythema, lesion or rash.     Comments: Open wound in center of low back, healing well. No heat or tenderness, no surrounding redness  Neurological:     General: No focal deficit present.     Mental Status: He is alert and oriented to person, place, and time. Mental status is at baseline.  Psychiatric:        Mood and Affect: Mood normal.        Behavior: Behavior normal.        Thought Content: Thought content normal.        Judgment: Judgment normal.     Results for orders placed or performed in visit on 05/14/22  Urine Culture   Specimen: Urine   UR  Result Value Ref  Range   Urine Culture, Routine Final report (A)    Organism ID, Bacteria Comment (A)    Antimicrobial Susceptibility Comment   Microscopic Examination   Urine  Result Value Ref Range   WBC, UA 0-5 0 - 5 /hpf   RBC, Urine 0-2 0 - 2 /hpf   Epithelial Cells (non renal) None seen 0 - 10 /hpf   Mucus, UA Present (A) Not Estab.   Bacteria, UA None seen None seen/Few  Urinalysis, Routine w reflex microscopic  Result Value Ref Range   Specific Gravity, UA 1.010 1.005 - 1.030   pH, UA 5.5 5.0 - 7.5   Color, UA Yellow Yellow   Appearance Ur Clear Clear   Leukocytes,UA Negative Negative   Protein,UA 3+ (A) Negative/Trace   Glucose, UA 3+ (A) Negative   Ketones, UA Negative Negative   RBC, UA Trace (A) Negative   Bilirubin, UA Negative Negative   Urobilinogen, Ur 0.2 0.2 - 1.0 mg/dL   Nitrite, UA Negative Negative   Microscopic Examination See below:       Assessment & Plan:   Problem List Items Addressed This Visit   None Visit Diagnoses     Olecranon bursitis of right elbow    -  Primary   Drained today as below. Call with any concerns.   Abscess of lower back       Healing. Continue dressing changes. Continue to monitor.      Procedure: Right Olecranon Bursitis Drainage and Steroid Injection        Diagnosis:   ICD-10-CM   1. Olecranon bursitis of right elbow  M70.21    Drained today as below. Call with any concerns.    2. Abscess of lower back  L02.212    Healing. Continue dressing changes. Continue to monitor.     Physician: MJ Consent:  Risks, benefits, and alternative treatments discussed and all questions were answered.  Patient elected to proceed and verbal consent obtained.  Description: Area prepped and draped using  semi-sterile technique.  After identifying area of maximal swelling, 18guage needle inserted and 17cc of serosanguinous fluid was aspirated until the bursa was flat, A mixture of 1cc 1% lidocaine and 0.5cc Kenalog 40 was injected.  A bandage was then  placed over the injection site. Complications: none Post Procedure Instructions: Wound care instructions discussed and patient was instructed to keep area clean and dry.  Signs and symptoms of infection discussed, patient agrees  to contact the office ASAP should they occur.  Follow Up: Return in about 1 week (around 06/04/2022).   Follow up plan: Return in about 1 week (around 06/04/2022).

## 2022-06-01 ENCOUNTER — Ambulatory Visit
Admission: RE | Admit: 2022-06-01 | Discharge: 2022-06-01 | Disposition: A | Discharge status: Home / Self Care | Payer: BLUE CROSS/BLUE SHIELD | Source: Ambulatory Visit | Attending: Interventional Radiology | Admitting: Interventional Radiology

## 2022-06-01 DIAGNOSIS — N401 Enlarged prostate with lower urinary tract symptoms: Secondary | ICD-10-CM

## 2022-06-01 HISTORY — PX: IR RADIOLOGIST EVAL & MGMT: IMG5224

## 2022-06-01 NOTE — Progress Notes (Signed)
Chief Complaint: Patient was seen in virtual telephone consultation today for follow up after PAE   Referring Physician(s): Nickolas Madrid, MD   History of Present Illness: Initial HPI (02/27/22): Justin Griffith is a 82 y.o. male presenting today via virtual telephone consultation at the gracious referral of Dr. Diamantina Providence for management of benign prostatic hyperplasia.  His clinical course has been complicated by recurrent urinary tract infections and gross hematuria in the setting of chronic Foley catheter dependence.  His prostate is reported to measure approximately 175 grams.  Dr. Diamantina Providence had originally planned for HOLEP procedure, however due to hematuria and PE (now on Eliquis) in June 2023, this was deferred.  His current indwelling 22 Fr Foley catheter was last exchanged on 02/02/22 in the emergency department.   He states he is motivated to have his catheter our, but does not want to undergo surgery.  We spoke about the overall periprocedural expectations and steps regarding PAE.    Mr. [name] underwent uneventful prostatic artery embolization on 05/07/22.  Angiogram was significant for right sided dominant prostatic artery supply, with diminutive left prostatic artery.  He has recovered well.  He reports no fevers, chills, abdominal pain, pelvic pain or cramping.  He still has his Foley catheter in place and has not seen Dr. Diamantina Providence since the procedure.  He describes some leakage of his indwelling Foley, and states that he has urinated spontaneously around the indwelling catheter several times.  He is hopeful to have this out soon.   Past Medical History:  Diagnosis Date   Acute urinary retention    Arthritis    Asthma 2000   Back pain    Colon polyp 2012   COPD (chronic obstructive pulmonary disease) (HCC)    Emphysema of lung (HCC)    Heart murmur    Hernia 2000   History of cold sores 05/06/2020   History of colon cancer    Hyperlipidemia    Hypertension    Personal history  of malignant neoplasm of large intestine    Personal history of tobacco use, presenting hazards to health    Septic shock ([place]) 11/18/2021   Sleep apnea    Special screening for malignant neoplasms, colon    Type 2 diabetes mellitus with proteinuria ([place]) 10/11/2014    Past Surgical History:  Procedure Laterality Date   CARDIAC CATHETERIZATION Left 09/25/2015   Procedure: Left Heart Cath and Coronary Angiography;  Surgeon: Yolonda Kida, MD;  Location: [facility];  Service: Cardiovascular;  Laterality: Left;   CHOLECYSTECTOMY  1970   COLON SURGERY  07-16-99   sigmoid colon resection with primary anastomosis, chemotherapy for metastatic disease   COLONOSCOPY  2001, 2012   Dr Bary Castilla, tubular adenoma of the cecum and ascending colon in 2012.   COLONOSCOPY WITH PROPOFOL N/A 02/12/2016   Procedure: COLONOSCOPY WITH PROPOFOL;  Surgeon: Robert Bellow, MD;  Location: [facility] ENDOSCOPY;  Service: Endoscopy;  Laterality: N/A;   CORONARY STENT INTERVENTION N/A 05/17/2020   Procedure: CORONARY STENT INTERVENTION;  Surgeon: Yolonda Kida, MD;  Location: [facility];  Service: Cardiovascular;  Laterality: N/A;  LAD   ESOPHAGOGASTRODUODENOSCOPY (EGD) WITH PROPOFOL N/A 02/12/2016   Procedure: ESOPHAGOGASTRODUODENOSCOPY (EGD) WITH PROPOFOL;  Surgeon: Robert Bellow, MD;  Location: [facility] ENDOSCOPY;  Service: Endoscopy;  Laterality: N/A;   HERNIA REPAIR Right    right inguinal hernia repair   IR 3D INDEPENDENT WKST  05/07/2022   IR ANGIOGRAM PELVIS SELECTIVE OR SUPRASELECTIVE  05/07/2022  IR ANGIOGRAM PELVIS SELECTIVE OR SUPRASELECTIVE  05/07/2022   IR ANGIOGRAM SELECTIVE EACH ADDITIONAL VESSEL  05/07/2022   IR EMBO ARTERIAL NOT HEMORR HEMANG INC GUIDE ROADMAPPING  05/07/2022   IR RADIOLOGIST EVAL & MGMT  02/27/2022   IR RADIOLOGIST EVAL & MGMT  03/25/2022   IR US GUIDE VASC ACCESS LEFT  05/07/2022   JOINT REPLACEMENT Bilateral    knee replacement   KNEE SURGERY Bilateral 2010    LEFT HEART CATH AND CORONARY ANGIOGRAPHY N/A 05/17/2020   Procedure: LEFT HEART CATH AND CORONARY ANGIOGRAPHY possible PCI and stent;  Surgeon: Yolonda Kida, MD;  Location: [facility];  Service: Cardiovascular;  Laterality: N/A;   MYRINGOTOMY WITH TUBE PLACEMENT Bilateral 08/16/2014   Procedure: MYRINGOTOMY WITH TUBE PLACEMENT;  Surgeon: Carloyn Manner, MD;  Location: [facility];  Service: ENT;  Laterality: Bilateral;   PERIPHERAL VASCULAR CATHETERIZATION Right 09/04/2015   Procedure: Lower Extremity Angiography;  Surgeon: Katha Cabal, MD;  Location: [facility];  Service: Cardiovascular;  Laterality: Right;   PERIPHERAL VASCULAR CATHETERIZATION  09/04/2015   Procedure: Lower Extremity Intervention;  Surgeon: Katha Cabal, MD;  Location: [facility];  Service: Cardiovascular;;   TONSILLECTOMY     TYMPANOSTOMY TUBE PLACEMENT      Allergies: Farxiga [dapagliflozin], Dulaglutide, Liraglutide, and Jardiance [empagliflozin]  Medications: Prior to Admission medications   Medication Sig Start Date End Date Taking? Authorizing Provider  acyclovir (ZOVIRAX) 400 MG tablet Take 1 tablet (400 mg total) by mouth 2 (two) times daily. 03/26/22   Johnson, Megan P, DO  apixaban (ELIQUIS) 2.5 MG TABS tablet Take 1 tablet (2.5 mg total) by mouth 2 (two) times daily. For blood thinner 04/09/22   Johnson, Megan P, DO  blood glucose meter kit and supplies 1 each by Other route daily. Dispense based on patient and insurance preference. Use up to four times daily as directed. (FOR ICD-10 E10.9, E11.9). 02/10/22   Johnson, Megan P, DO  cloNIDine (CATAPRES) 0.1 MG tablet Take 1 tablet (0.1 mg total) by mouth 2 (two) times daily. For blood pressure 04/09/22   Johnson, Megan P, DO  diclofenac Sodium (VOLTAREN) 1 % GEL Apply 4 g topically 4 (four) times daily. 04/21/22   Johnson, Megan P, DO  gabapentin (NEURONTIN) 400 MG capsule Take 1 capsule (400 mg total) by mouth 3 (three) times  daily. For nerve pain and itching 05/20/22   Johnson, Megan P, DO  GNP VITAMIN C 500 MG tablet TAKE 1 TABLET BY MOUTH ONCE DAILY TO KEEP YOUR IMMUNE SYSTEM UP 04/17/22   Park Liter P, DO  hydrALAZINE (APRESOLINE) 100 MG tablet Take 1 tablet (100 mg total) by mouth 3 (three) times daily. for blood pressure 04/30/22   Johnson, Megan P, DO  losartan (COZAAR) 100 MG tablet Take 1 tablet (100 mg total) by mouth daily. For blood pressure 04/09/22   Johnson, Megan P, DO  Multiple Vitamin (MULTIVITAMIN WITH MINERALS) TABS tablet Take 1 tablet by mouth daily. For nutrition 02/23/22   Park Liter P, DO  nystatin (MYCOSTATIN/NYSTOP) powder Apply 1 Application topically 3 (three) times daily. 04/03/22   Johnson, Megan P, DO  nystatin ointment (MYCOSTATIN) Apply 1 Application topically 2 (two) times daily. 05/14/22   Johnson, Megan P, DO  Olopatadine HCl 0.2 % SOLN Apply 1 drop to eye daily. 04/09/22   Johnson, Megan P, DO  omeprazole (PRILOSEC) 40 MG capsule Take 1 capsule (40 mg total) by mouth daily. For heartburn 04/09/22  Johnson, Megan P, DO  polyethylene glycol powder (GLYCOLAX/MIRALAX) 17 GM/SCOOP powder Take 17 g by mouth 2 (two) times daily as needed. 03/02/22   Johnson, Megan P, DO  rosuvastatin (CRESTOR) 10 MG tablet Take 1 tablet (10 mg total) by mouth at bedtime. For cholesterol 04/09/22   Johnson, Megan P, DO  Semaglutide (RYBELSUS) 3 MG TABS Take 3 mg by mouth daily. 04/03/22   Johnson, Megan P, DO  Semaglutide (RYBELSUS) 7 MG TABS Take 1 tablet (7 mg total) by mouth daily. 04/21/22   Johnson, Megan P, DO  sodium hypochlorite (DAKIN'S 1/2 STRENGTH) external solution Apply 1 Application topically daily at 12 noon. Topically to wound daily to clean. 02/13/22   [provider]  Torsemide 40 MG TABS Take by mouth. 02/26/22 02/26/23  [provider]  umeclidinium bromide (INCRUSE ELLIPTA) 62.5 MCG/ACT AEPB Inhale 1 puff into the lungs daily. For your breathing 02/18/22   Marnee Guarneri T, NP   Zinc Oxide 10 % OINT Apply topically.    [provider]     Family History  Problem Relation Age of Onset   Diabetes Father    Esophageal cancer Mother    Alzheimer's disease Paternal Uncle     Social History   Socioeconomic History   Marital status: Married    Spouse name: Not on file   Number of children: Not on file   Years of education: Not on file   Highest education level: GED or equivalent  Occupational History   Occupation: retired    Comment: [employer]  Tobacco Use   Smoking status: Former    Packs/day: 1.00    Years: 30.00    Total pack years: 30.00    Types: Cigarettes    Quit date: 03/23/1989    Years since quitting: 33.2    Passive exposure: Past   Smokeless tobacco: Never  Vaping Use   Vaping Use: Never used  Substance and Sexual Activity   Alcohol use: No    Alcohol/week: 0.0 standard drinks of alcohol   Drug use: No   Sexual activity: Not Currently  Other Topics Concern   Not on file  Social History Narrative   [employer]    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    Social Determinants of Health   Financial Resource Strain: Low Risk  (03/03/2022)   Overall Financial Resource Strain (CARDIA)    Difficulty of Paying Living Expenses: Not hard at all  Food Insecurity: No Food Insecurity (03/03/2022)   Hunger Vital Sign    Worried About Running Out of Food in the Last Year: Never true    [place] in the Last Year: Never true  Transportation Needs: No Transportation Needs (03/03/2022)   PRAPARE - Hydrologist (Medical): No    Lack of Transportation (Non-Medical): No  Physical Activity: Inactive (03/03/2022)   Exercise Vital Sign    Days of Exercise per Week: 0 days    Minutes of Exercise per Session: 0 min  Stress: No Stress Concern Present (03/03/2022)   [place]    Feeling of Stress : Only a little  Social Connections: Moderately Integrated (03/03/2022)    Social Connection and Isolation Panel [NHANES]    Frequency of Communication with Friends and Family: More than three times a week    Frequency of Social Gatherings with Friends and Family: More than three times a week    Attends Religious Services: More than 4  times per year    Active Member of Clubs or Organizations: No    Attends Archivist Meetings: Never    Marital Status: Married     Vital Signs: There were no vitals taken for this visit.  No physical examination was performed in lieu of virtual telephone clinic visit.  Imaging: CT AP 01/30/22   7.7 x 6.7 x 7.9 cm = 213 cc  PAE 05/07/22  Right PA  Left PA  Labs:  CBC: Recent Labs    03/10/22 1551 04/01/22 1930 04/02/22 0638 05/07/22 0823  WBC 6.2 6.7 7.6 8.7  HGB 11.6* 11.6* 11.4* 9.1*  HCT 35.6* 36.0* 35.3* 29.2*  PLT 312 297 273 385    COAGS: Recent Labs    09/19/21 1650 10/12/21 2302 11/17/21 2142 11/18/21 0329 11/18/21 1756 11/19/21 0147 11/20/21 0439 05/07/22 0823  INR 1.3*  --  2.9* 2.6*  --   --   --  1.1  APTT 29   < > 44*  --  83* 68* 70*  --    < > = values in this interval not displayed.    BMP: Recent Labs    02/02/22 0049 02/04/22 1440 04/01/22 1930 04/02/22 0638 04/21/22 1032 05/07/22 0823  NA 138   < > 138 137 134 134*  K 3.1*   < > 3.8 3.6 4.2 3.7  CL 106   < > 101 99 95* 95*  CO2 25   < > '27 26 25 27  '$ GLUCOSE 209*   < > 279* 243* 276* 312*  BUN 13   < > 27* 27* 19 24*  CALCIUM 9.8   < > 9.4 9.7 9.5 9.3  CREATININE 1.07   < > 1.55* 1.30* 1.32* 1.45*  GFRNONAA >60  --  44* 55*  --  48*   < > = values in this interval not displayed.    LIVER FUNCTION TESTS: Recent Labs    02/04/22 1440 04/01/22 1930 04/02/22 0638 05/07/22 0823  BILITOT 0.3 0.5 0.5 0.5  AST '12 16 15 '$ 12*  ALT '9 10 10 8  '$ ALKPHOS 63 57 57 62  PROT 6.6 7.4 7.5 7.3  ALBUMIN 3.4* 3.4* 3.4* 3.0*    Assessment and Plan: 82 year old male with history of benign prostatic hyperplasia  with catheter dependence and hematuria now status post prostate artery embolization on 05/07/22.  The right prostatic artery was dominant supply of the prostate gland therefore the diminutive left prostatic artery was not embolized.   He recovered well from the procedure.  He reports some spontaneous voiding around the indwelling Foley catheter in the interim.  I suspect this is a good sign he is ready to have the catheter out, but will defer to Dr. Diamantina Providence.   Plan to follow up in 2 months in IR clinic to assess progress.  Electronically Signed: Suzette Battiest 06/01/2022, 8:55 AM   I spent a total of 25 Minutes in virtual telephone clinical consultation, greater than 50% of which was counseling/coordinating care for benign prostatic hyperplasia.

## 2022-06-02 ENCOUNTER — Encounter: Payer: Self-pay | Admitting: Family Medicine

## 2022-06-02 ENCOUNTER — Ambulatory Visit (INDEPENDENT_AMBULATORY_CARE_PROVIDER_SITE_OTHER): Payer: PPO | Admitting: Family Medicine

## 2022-06-02 VITALS — BP 146/72 | HR 96

## 2022-06-02 DIAGNOSIS — I129 Hypertensive chronic kidney disease with stage 1 through stage 4 chronic kidney disease, or unspecified chronic kidney disease: Secondary | ICD-10-CM

## 2022-06-02 DIAGNOSIS — E785 Hyperlipidemia, unspecified: Secondary | ICD-10-CM | POA: Diagnosis not present

## 2022-06-02 DIAGNOSIS — E1169 Type 2 diabetes mellitus with other specified complication: Secondary | ICD-10-CM

## 2022-06-02 DIAGNOSIS — M7022 Olecranon bursitis, left elbow: Secondary | ICD-10-CM

## 2022-06-02 DIAGNOSIS — Z978 Presence of other specified devices: Secondary | ICD-10-CM

## 2022-06-02 DIAGNOSIS — E1122 Type 2 diabetes mellitus with diabetic chronic kidney disease: Secondary | ICD-10-CM

## 2022-06-02 DIAGNOSIS — R300 Dysuria: Secondary | ICD-10-CM

## 2022-06-02 LAB — URINALYSIS, ROUTINE W REFLEX MICROSCOPIC
Bilirubin, UA: NEGATIVE
Ketones, UA: NEGATIVE
Nitrite, UA: NEGATIVE
Specific Gravity, UA: 1.015 (ref 1.005–1.030)
Urobilinogen, Ur: 0.2 mg/dL (ref 0.2–1.0)
pH, UA: 5 (ref 5.0–7.5)

## 2022-06-02 LAB — MICROSCOPIC EXAMINATION
Bacteria, UA: NONE SEEN
Epithelial Cells (non renal): NONE SEEN /hpf (ref 0–10)

## 2022-06-02 LAB — BAYER DCA HB A1C WAIVED: HB A1C (BAYER DCA - WAIVED): 11.5 % — ABNORMAL HIGH (ref 4.8–5.6)

## 2022-06-02 MED ORDER — RYBELSUS 14 MG PO TABS: 14.0000 mg | ORAL_TABLET | Freq: Every day | ORAL | 3 refills | Status: DC

## 2022-06-02 MED ORDER — METFORMIN HCL 500 MG PO TABS: 500.0000 mg | ORAL_TABLET | Freq: Two times a day (BID) | ORAL | 1 refills | Status: DC

## 2022-06-02 NOTE — Progress Notes (Signed)
BP (!) 146/72   Pulse 96   SpO2 95%    Subjective:    Patient ID: Justin Griffith, male    DOB: 04/28/1940, 82 y.o.   MRN: [id number]  HPI: Justin Griffith is a 82 y.o. male  Chief Complaint  Patient presents with   Wound Check   Diabetes   Hypertension   Buttocks is sore  DIABETES Hypoglycemic episodes:no Polydipsia/polyuria: yes Visual disturbance: no Chest pain: no Paresthesias: no Glucose Monitoring: no  Accucheck frequency: Not Checking Taking Insulin?: no Blood Pressure Monitoring: weekly Retinal Examination: Not up to Date Foot Exam: Not up to Date Diabetic Education: Completed Pneumovax: Up to Date Influenza: Up to Date Aspirin: no  HYPERTENSION  Hypertension status: better  Satisfied with current treatment? yes Duration of hypertension: chronic BP monitoring frequency:  weekly BP medication side effects:  no Medication compliance: good compliance Aspirin: no Recurrent headaches: no Visual changes: no Palpitations: no Dyspnea: no Chest pain: no Lower extremity edema: no Dizzy/lightheaded: no  ARM PAIN Duration: weeks Location: Left elbow Mechanism of injury: unknown Onset: gradual Severity: moderate  Quality:  aching and sore Frequency: constant Radiation: no Aggravating factors: nothing  Alleviating factors: nothing  Status: stable Treatments attempted: rest, ice, heat, APAP, ibuprofen, and aleve  Relief with NSAIDs?:  no Swelling: yes Redness: no  Warmth: no Trauma: no Chest pain: no  Shortness of breath: no  Fever: no Decreased sensation: no Paresthesias: no Weakness: no   Relevant past medical, surgical, family and social history reviewed and updated as indicated. Interim medical history since our last visit reviewed. Allergies and medications reviewed and updated.  Review of Systems  Constitutional: Negative.   Respiratory: Negative.    Cardiovascular: Negative.   Gastrointestinal: Negative.   Genitourinary: Negative.    Musculoskeletal:  Positive for arthralgias. Negative for back pain, gait problem, joint swelling, myalgias, neck pain and neck stiffness.  Skin: Negative.   Psychiatric/Behavioral: Negative.      Per HPI unless specifically indicated above     Objective:    BP (!) 146/72   Pulse 96   SpO2 95%   Wt Readings from Last 3 Encounters:  05/21/22 250 lb (113.4 kg)  05/14/22 250 lb (113.4 kg)  05/07/22 251 lb 1.6 oz (113.9 kg)    Physical Exam Vitals and nursing note reviewed.  Constitutional:      General: He is not in acute distress.    Appearance: Normal appearance. He is not ill-appearing, toxic-appearing or diaphoretic.  HENT:     Head: Normocephalic and atraumatic.     Right Ear: External ear normal.     Left Ear: External ear normal.     Nose: Nose normal.     Mouth/Throat:     Mouth: Mucous membranes are moist.     Pharynx: Oropharynx is clear.  Eyes:     General: No scleral icterus.       Right eye: No discharge.        Left eye: No discharge.     Extraocular Movements: Extraocular movements intact.     Conjunctiva/sclera: Conjunctivae normal.     Pupils: Pupils are equal, round, and reactive to light.  Cardiovascular:     Rate and Rhythm: Normal rate and regular rhythm.     Pulses: Normal pulses.     Heart sounds: Normal heart sounds. No murmur heard.    No friction rub. No gallop.  Pulmonary:     Effort: Pulmonary effort is normal. No respiratory  distress.     Breath sounds: Normal breath sounds. No stridor. No wheezing, rhonchi or rales.  Chest:     Chest wall: No tenderness.  Musculoskeletal:        General: Normal range of motion.     Cervical back: Normal range of motion and neck supple.     Comments: Swollen L olecranon bursa  Skin:    General: Skin is warm and dry.     Capillary Refill: Capillary refill takes less than 2 seconds.     Coloration: Skin is not jaundiced or pale.     Findings: No bruising, erythema, lesion or rash.  Neurological:      General: No focal deficit present.     Mental Status: He is alert and oriented to person, place, and time. Mental status is at baseline.  Psychiatric:        Mood and Affect: Mood normal.        Behavior: Behavior normal.        Thought Content: Thought content normal.        Judgment: Judgment normal.     Results for orders placed or performed in visit on 06/02/22  Urine Culture   Specimen: Urine   UR  Result Value Ref Range   Urine Culture, Routine Final report    Organism ID, Bacteria Comment   Microscopic Examination   Urine  Result Value Ref Range   WBC, UA 0-5 0 - 5 /hpf   RBC, Urine 3-10 (A) 0 - 2 /hpf   Epithelial Cells (non renal) None seen 0 - 10 /hpf   Bacteria, UA None seen None seen/Few   Yeast, UA Present (A) None seen  Bayer DCA Hb A1c Waived  Result Value Ref Range   HB A1C (BAYER DCA - WAIVED) 11.5 (H) 4.8 - 5.6 %  Comprehensive metabolic panel  Result Value Ref Range   Glucose 484 (H) 70 - 99 mg/dL   BUN 35 (H) 8 - 27 mg/dL   Creatinine, Ser 1.59 (H) 0.76 - 1.27 mg/dL   eGFR 43 (L) >59 mL/min/1.73   BUN/Creatinine Ratio 22 10 - 24   Sodium 129 (L) 134 - 144 mmol/L   Potassium 4.3 3.5 - 5.2 mmol/L   Chloride 91 (L) 96 - 106 mmol/L   CO2 22 20 - 29 mmol/L   Calcium 10.0 8.6 - 10.2 mg/dL   Total Protein 6.9 6.0 - 8.5 g/dL   Albumin 3.6 (L) 3.7 - 4.7 g/dL   Globulin, Total 3.3 1.5 - 4.5 g/dL   Albumin/Globulin Ratio 1.1 (L) 1.2 - 2.2   Bilirubin Total 0.2 0.0 - 1.2 mg/dL   Alkaline Phosphatase 92 44 - 121 IU/L   AST 7 0 - 40 IU/L   ALT 12 0 - 44 IU/L  Lipid Panel w/o Chol/HDL Ratio  Result Value Ref Range   Cholesterol, Total 135 100 - 199 mg/dL   Triglycerides 249 (H) 0 - 149 mg/dL   HDL 34 (L) >39 mg/dL   VLDL Cholesterol Cal 40 5 - 40 mg/dL   LDL Chol Calc (NIH) 61 0 - 99 mg/dL  Urinalysis, Routine w reflex microscopic  Result Value Ref Range   Specific Gravity, UA 1.015 1.005 - 1.030   pH, UA 5.0 5.0 - 7.5   Color, UA Yellow Yellow    Appearance Ur Cloudy (A) Clear   Leukocytes,UA Trace (A) Negative   Protein,UA 3+ (A) Negative/Trace   Glucose, UA 2+ (A) Negative   Ketones,  UA Negative Negative   RBC, UA 2+ (A) Negative   Bilirubin, UA Negative Negative   Urobilinogen, Ur 0.2 0.2 - 1.0 mg/dL   Nitrite, UA Negative Negative   Microscopic Examination See below:       Assessment & Plan:   Problem List Items Addressed This Visit       Endocrine   Hyperlipidemia associated with type 2 diabetes mellitus ([place])    Under good control on current regimen. Continue current regimen. Continue to monitor. Call with any concerns. Refills given. Labs drawn today.        Relevant Medications   Semaglutide (RYBELSUS) 14 MG TABS   metFORMIN (GLUCOPHAGE) 500 MG tablet   Other Relevant Orders   Comprehensive metabolic panel (Completed)   Lipid Panel w/o Chol/HDL Ratio (Completed)   Type II diabetes mellitus with renal manifestations (HCC)    Sugars not under good control. Will restart metformin and increase rybelsus. Recheck 3 months. Call with any concerns.       Relevant Medications   Semaglutide (RYBELSUS) 14 MG TABS   metFORMIN (GLUCOPHAGE) 500 MG tablet   Other Relevant Orders   Bayer DCA Hb A1c Waived (Completed)   Comprehensive metabolic panel (Completed)     Genitourinary   Benign hypertensive renal disease    Significantly better on current regimen. Continue to monitor. Call with any concerns. Recheck 1 month.       Relevant Orders   Comprehensive metabolic panel (Completed)   Other Visit Diagnoses     Olecranon bursitis of left elbow    -  Primary   Drained and injected today as below.   Dysuria       Will check urine. Await results.   Relevant Orders   Urinalysis, Routine w reflex microscopic (Completed)   Urine Culture (Completed)   Indwelling Foley catheter present       Await urine culture. Treat as needed.   Relevant Orders   Urine Culture (Completed)       Procedure: Left Olecranon  Bursitis Drainage and Steroid Injection              Diagnosis:   ICD-10-CM   1. Olecranon bursitis of left elbow  M70.22    Drained and injected today as below.    2. Type 2 diabetes mellitus with chronic kidney disease, without long-term current use of insulin, unspecified CKD stage (HCC)  E11.22 Bayer DCA Hb A1c Waived    Comprehensive metabolic panel    3. Benign hypertensive renal disease  I12.9 Comprehensive metabolic panel    4. Dysuria  R30.0 Urinalysis, Routine w reflex microscopic    Urine Culture   Will check urine. Await results.    5. Hyperlipidemia associated with type 2 diabetes mellitus (HCC)  E11.69 Comprehensive metabolic panel   [id number] Lipid Panel w/o Chol/HDL Ratio    6. Indwelling Foley catheter present  Z97.8 Urine Culture   Await urine culture. Treat as needed.     Physician: MJ Consent:  Risks, benefits, and alternative treatments discussed and all questions were answered.  Patient elected to proceed and verbal consent obtained.  Description: Area prepped and draped using  semi-sterile technique.  After identifying area of maximal swelling, 18guage needle inserted and 2cc of serosanguinous fluid was aspirated until the bursa was flat, A mixture of 1cc 1% lidocaine and 0.5cc Kenalog 40 was injected.  A bandage was then placed over the injection site. Complications: none Post Procedure Instructions: Wound care instructions discussed and patient was  instructed to keep area clean and dry.  Signs and symptoms of infection discussed, patient agrees to contact the office ASAP should they occur.  Follow Up: Return in about 1 week (around 06/04/2022).  Follow up plan: Return in about 1 week (around 06/09/2022).

## 2022-06-03 ENCOUNTER — Telehealth: Payer: Self-pay | Admitting: Family Medicine

## 2022-06-03 LAB — COMPREHENSIVE METABOLIC PANEL
ALT: 12 IU/L (ref 0–44)
AST: 7 IU/L (ref 0–40)
Albumin/Globulin Ratio: 1.1 — ABNORMAL LOW (ref 1.2–2.2)
Albumin: 3.6 g/dL — ABNORMAL LOW (ref 3.7–4.7)
Alkaline Phosphatase: 92 IU/L (ref 44–121)
BUN/Creatinine Ratio: 22 (ref 10–24)
BUN: 35 mg/dL — ABNORMAL HIGH (ref 8–27)
Bilirubin Total: 0.2 mg/dL (ref 0.0–1.2)
CO2: 22 mmol/L (ref 20–29)
Calcium: 10 mg/dL (ref 8.6–10.2)
Chloride: 91 mmol/L — ABNORMAL LOW (ref 96–106)
Creatinine, Ser: 1.59 mg/dL — ABNORMAL HIGH (ref 0.76–1.27)
Globulin, Total: 3.3 g/dL (ref 1.5–4.5)
Glucose: 484 mg/dL — ABNORMAL HIGH (ref 70–99)
Potassium: 4.3 mmol/L (ref 3.5–5.2)
Sodium: 129 mmol/L — ABNORMAL LOW (ref 134–144)
Total Protein: 6.9 g/dL (ref 6.0–8.5)
eGFR: 43 mL/min/{1.73_m2} — ABNORMAL LOW (ref >59)

## 2022-06-03 LAB — LIPID PANEL W/O CHOL/HDL RATIO
Cholesterol, Total: 135 mg/dL (ref 100–199)
HDL: 34 mg/dL — ABNORMAL LOW (ref >39)
LDL Chol Calc (NIH): 61 mg/dL (ref 0–99)
Triglycerides: 249 mg/dL — ABNORMAL HIGH (ref 0–149)
VLDL Cholesterol Cal: 40 mg/dL (ref 5–40)

## 2022-06-03 NOTE — Telephone Encounter (Signed)
Copied from [place] [id number]. Topic: General - Other >> Jun 03, 2022  9:28 AM Everette C wrote: Reason for CRM: Moncee with [employer] would like to speak with a member of clinical staff regarding medication management for the patient   Please contact further when available

## 2022-06-03 NOTE — Telephone Encounter (Signed)
Return call to [facility] with [employer] in regards to the patient. Was informed Moncee would give our office a call back.

## 2022-06-04 ENCOUNTER — Ambulatory Visit: Payer: PPO | Admitting: Urology

## 2022-06-04 ENCOUNTER — Telehealth: Payer: Self-pay | Admitting: Family Medicine

## 2022-06-04 NOTE — Telephone Encounter (Signed)
Heather from home called and stating Justin Griffith catheter is leaking. She requested to get orders to change the catheter. I spoke to Dr. Diamantina Providence and he approved her to change it. She will use a 20Coude. He is going to keep his upcoming follow up appointments for voiding trial.

## 2022-06-05 ENCOUNTER — Telehealth: Payer: Self-pay | Admitting: Family Medicine

## 2022-06-05 LAB — URINE CULTURE

## 2022-06-05 NOTE — Telephone Encounter (Unsigned)
Copied from [place] [phone]. Topic: Referral - Request for Referral >> Jun 05, 2022 11:26 AM Everette C wrote: Has patient seen PCP for this complaint? Yes.   *If NO, is insurance requiring patient see PCP for this issue before PCP can refer them? Referral for which specialty: [place]  Preferred provider/office: [facility]  Reason for referral: Home health services   Fax to [phone]   The patient will be discharged under their [name] insurance 06/05/22

## 2022-06-07 ENCOUNTER — Encounter: Payer: Self-pay | Admitting: Family Medicine

## 2022-06-07 NOTE — Assessment & Plan Note (Signed)
Sugars not under good control. Will restart metformin and increase rybelsus. Recheck 3 months. Call with any concerns.

## 2022-06-07 NOTE — Assessment & Plan Note (Signed)
Under good control on current regimen. Continue current regimen. Continue to monitor. Call with any concerns. Refills given. Labs drawn today.   

## 2022-06-07 NOTE — Assessment & Plan Note (Signed)
Significantly better on current regimen. Continue to monitor. Call with any concerns. Recheck 1 month.

## 2022-06-08 ENCOUNTER — Emergency Department
Admission: EM | Admit: 2022-06-08 | Discharge: 2022-06-08 | Disposition: A | Discharge status: Home / Self Care | Payer: PPO | Attending: Emergency Medicine | Admitting: Emergency Medicine

## 2022-06-08 ENCOUNTER — Other Ambulatory Visit: Payer: Self-pay

## 2022-06-08 ENCOUNTER — Telehealth: Payer: Self-pay

## 2022-06-08 DIAGNOSIS — R339 Retention of urine, unspecified: Secondary | ICD-10-CM | POA: Diagnosis not present

## 2022-06-08 DIAGNOSIS — J449 Chronic obstructive pulmonary disease, unspecified: Secondary | ICD-10-CM | POA: Insufficient documentation

## 2022-06-08 DIAGNOSIS — T83091A Other mechanical complication of indwelling urethral catheter, initial encounter: Secondary | ICD-10-CM | POA: Insufficient documentation

## 2022-06-08 DIAGNOSIS — Y732 Prosthetic and other implants, materials and accessory gastroenterology and urology devices associated with adverse incidents: Secondary | ICD-10-CM | POA: Insufficient documentation

## 2022-06-08 DIAGNOSIS — I2510 Atherosclerotic heart disease of native coronary artery without angina pectoris: Secondary | ICD-10-CM | POA: Diagnosis not present

## 2022-06-08 DIAGNOSIS — I509 Heart failure, unspecified: Secondary | ICD-10-CM | POA: Diagnosis not present

## 2022-06-08 DIAGNOSIS — N189 Chronic kidney disease, unspecified: Secondary | ICD-10-CM | POA: Insufficient documentation

## 2022-06-08 DIAGNOSIS — I130 Hypertensive heart and chronic kidney disease with heart failure and stage 1 through stage 4 chronic kidney disease, or unspecified chronic kidney disease: Secondary | ICD-10-CM | POA: Insufficient documentation

## 2022-06-08 DIAGNOSIS — E1122 Type 2 diabetes mellitus with diabetic chronic kidney disease: Secondary | ICD-10-CM | POA: Insufficient documentation

## 2022-06-08 DIAGNOSIS — T839XXA Unspecified complication of genitourinary prosthetic device, implant and graft, initial encounter: Secondary | ICD-10-CM

## 2022-06-08 NOTE — Telephone Encounter (Signed)
Gene from [employer] calling to do Medication Review for pt's insurance purposes. Was reviewing medications with her, she states that they have not had a recent claim with filling metformin or semaglutide. Advised her from OV notes, pt was restarted on both medications so possibly pt stopped himself. Also provided recent A1C and BP. No further assistance needed.

## 2022-06-08 NOTE — ED Triage Notes (Signed)
Pt presents to ER with c/o foley catheter not draining.  Pt states foley was replaced on Thursday last week, and had not drained since this morning.  Pt requesting foley change.  Pt is otherwise A&O x4 and in NAD in triage.

## 2022-06-08 NOTE — ED Provider Notes (Signed)
[name] Provider Note    None    (approximate)   History   Chief Complaint Urinary Retention   HPI  Justin Griffith is a 82 y.o. male with past medical history of hypertension, diabetes, CAD, CHF, PAD, PE on Eliquis, CKD, and COPD who presents to the ED with urinary retention.  Patient reports that he had his chronic indwelling Foley catheter replaced at the end of last week and it initially seem to be functioning well.  Since about 2:00 this afternoon, he states he has not had any flow of urine from the catheter, has dealt with increasing pain in his suprapubic area since then.  He denies any associated fevers, dysuria, hematuria, or flank pain.  Foley catheter placed in triage and patient states that symptoms have resolved, feels like urine is flowing normally now.     Physical Exam   Triage Vital Signs: ED Triage Vitals  Enc Vitals Group     BP 06/08/22 2157 (!) 178/109     Pulse Rate 06/08/22 2157 (!) 114     Resp 06/08/22 2157 20     Temp 06/08/22 2157 98.4 F (36.9 C)     Temp Source 06/08/22 2157 Oral     SpO2 06/08/22 2157 98 %     Weight 06/08/22 2156 245 lb (111.1 kg)     Height 06/08/22 2156 5\' 10"  (1.778 m)     Head Circumference --      Peak Flow --      Pain Score 06/08/22 2227 10     Pain Loc --      Pain Edu? --      Excl. in [place]? --     Most recent vital signs: Vitals:   06/08/22 2157  BP: (!) 178/109  Pulse: (!) 114  Resp: 20  Temp: 98.4 F (36.9 C)  SpO2: 98%    Constitutional: Alert and oriented. Eyes: Conjunctivae are normal. Head: Atraumatic. Nose: No congestion/rhinnorhea. Mouth/Throat: Mucous membranes are moist.  Cardiovascular: Normal rate, regular rhythm. Grossly normal heart sounds.  2+ radial pulses bilaterally. Respiratory: Normal respiratory effort.  No retractions. Lungs CTAB. Gastrointestinal: Soft and nontender. No distention. Genitourinary: Foley catheter in place draining clear yellow  urine. Musculoskeletal: No lower extremity tenderness nor edema.  Neurologic:  Normal speech and language. No gross focal neurologic deficits are appreciated.    ED Results / Procedures / Treatments   Labs (all labs ordered are listed, but only abnormal results are displayed) Labs Reviewed - No data to display   PROCEDURES:  Critical Care performed: No  Procedures   MEDICATIONS ORDERED IN ED: Medications - No data to display   IMPRESSION / MDM / [place] / ED COURSE  I reviewed the triage vital signs and the nursing notes.                              82 y.o. male with past medical history of urinary retention and chronic indwelling Foley who presents to the ED complaining of no flow of urine from his Foley catheter since earlier this afternoon with increasing suprapubic pain.  Patient's presentation is most consistent with acute, uncomplicated illness.  Differential diagnosis includes, but is not limited to, obstructed Foley catheter, UTI, hematuria.  Patient well-appearing and in no acute distress, vital signs remarkable for tachycardia and hypertension, likely secondary to discomfort from urinary retention.  Foley catheter was replaced in  triage with resolution of patient's abdominal pain, catheter now flowing with clear yellow urine.  No symptoms to suggest UTI and no significant hematuria noted.  Patient is appropriate for discharge home, states he has urology follow-up for later this week.  He was counseled to return to the ED for new or worsening symptoms, patient agrees with plan.      FINAL CLINICAL IMPRESSION(S) / ED DIAGNOSES   Final diagnoses:  Problem with Foley catheter, initial encounter [facility])     Rx / DC Orders   ED Discharge Orders     None        Note:  This document was prepared using Dragon voice recognition software and may include unintentional dictation errors.   Blake Divine, MD 06/08/22 2240

## 2022-06-08 NOTE — ED Notes (Signed)
MD Charna Archer to triage to see pt.

## 2022-06-09 ENCOUNTER — Ambulatory Visit: Payer: PPO | Admitting: Physician Assistant

## 2022-06-09 ENCOUNTER — Other Ambulatory Visit: Payer: Self-pay

## 2022-06-09 ENCOUNTER — Encounter: Payer: Self-pay | Admitting: Surgery

## 2022-06-09 ENCOUNTER — Ambulatory Visit (INDEPENDENT_AMBULATORY_CARE_PROVIDER_SITE_OTHER): Payer: HMO | Admitting: Surgery

## 2022-06-09 VITALS — BP 152/68 | HR 72 | Temp 98.0°F | Ht 70.0 in | Wt 244.0 lb

## 2022-06-09 DIAGNOSIS — L929 Granulomatous disorder of the skin and subcutaneous tissue, unspecified: Secondary | ICD-10-CM | POA: Diagnosis not present

## 2022-06-09 NOTE — Patient Instructions (Signed)
You may notice a dark grayish discharge. This is normal. Continue to keep it covered until well healed. Please call our office if you have any questions or concerns.

## 2022-06-09 NOTE — Progress Notes (Signed)
[facility] POST-OP OFFICE VISIT  06/09/2022  HPI: Justin Griffith is a 82 y.o. male  s/p incision and drainage/excision of infected/ruptured sebaceous cyst of the mid back.  He has been treating his open wound with dressing changes, no longer able to pack it.  Presents for wound evaluation and follow-up.  Vital signs: BP (!) 152/68   Pulse 72   Temp 98 F (36.7 C) (Oral)   Ht 5\' 10"  (1.778 m)   Wt 244 lb (110.7 kg)   SpO2 98%   BMI 35.01 kg/m    Physical Exam: Constitutional: Appears at his baseline.  Skin: There is a very narrow approximately 5 to 8 mm wound of his mid back approximately 2-1/2 to 3 cm long, well granulated with perhaps a bit of hypergranulation tissue centrally.  This was cauterized with silver nitrate sticks.  And redressed with a Band-Aid.  Assessment/Plan: This is a 82 y.o. male  days s/p above.  Minimal persisting hypergranulation tissue of healing back wound, chemically cauterized with silver nitrate.  Patient Active Problem List   Diagnosis Date Noted   Cutaneous abscess of back (any part, except buttock) 05/14/2022   Inflamed sebaceous cyst 05/14/2022   Senile purpura ([place]) 04/09/2022   Fever blister 03/20/2022   Iron deficiency anemia 03/11/2022   Normocytic anemia 03/10/2022   Mild dementia without behavioral disturbance, psychotic disturbance, mood disturbance, or anxiety ([place]) 02/24/2022   Benign hypertensive renal disease 02/04/2022   Pressure injury of sacral region, unstageable ([place])    Acute kidney injury superimposed on chronic kidney disease ([place]) 11/18/2021   HLD (hyperlipidemia) 10/31/2021   Type II diabetes mellitus with renal manifestations ([place]) 10/31/2021   CAD (coronary artery disease) 10/31/2021   Chronic diastolic CHF (congestive heart failure) ([place]) 10/31/2021   Right elbow pain 10/15/2021   Gross hematuria 10/12/2021   Chronic anticoagulation 10/12/2021   Stage 3b chronic kidney disease (CKD) ([place]) 10/12/2021    History of pulmonary embolism 09/18/21 10/06/2021   Generalized weakness 06/25/2021   Lymphedema 03/17/2021   Falls frequently 11/28/2020   Aortic atherosclerosis ([place]) 06/12/2020   History of embolic stroke [id number]   At high risk for falls 05/08/2020   Hyponatremia 03/29/2020   History of non-ST elevation myocardial infarction (NSTEMI) 03/28/2020   Elevated TSH 02/25/2020   History of CVA (cerebrovascular accident) 01/23/2020   Chronic venous insufficiency 05/23/2019   B12 deficiency 04/23/2019   Tremor 11/14/2018   Obesity (BMI 30-39.9) 09/07/2017   Chronic obstructive pulmonary disease (COPD) ([place]) 04/29/2017   Intertrigo 02/02/2017   BPH with obstruction/lower urinary tract symptoms Foley dependent 07/23/2016   Advanced care planning/counseling discussion [id number]   Eosinophilic esophagitis [id number]   PAD (peripheral artery disease) ([place]) 01/08/2016   GERD without esophagitis 01/06/2016   Hyperlipidemia associated with type 2 diabetes mellitus ([place]) 01/16/2015   Sleep apnea 01/16/2015    -Cover with Band-Aid on daily basis, anticipate complete healing in 2 weeks.  If not completely healed further evaluation to ensure progress can be maintained.   Ronny Bacon M.D., FACS 06/09/2022, 1:42 PM

## 2022-06-10 ENCOUNTER — Ambulatory Visit (INDEPENDENT_AMBULATORY_CARE_PROVIDER_SITE_OTHER): Payer: PPO | Admitting: Family Medicine

## 2022-06-10 ENCOUNTER — Encounter: Payer: Self-pay | Admitting: Family Medicine

## 2022-06-10 VITALS — BP 122/64 | HR 98 | Temp 98.0°F | Wt 249.3 lb

## 2022-06-10 DIAGNOSIS — L89150 Pressure ulcer of sacral region, unstageable: Secondary | ICD-10-CM | POA: Diagnosis not present

## 2022-06-10 DIAGNOSIS — I700 Atherosclerosis of aorta: Secondary | ICD-10-CM | POA: Diagnosis not present

## 2022-06-10 DIAGNOSIS — R296 Repeated falls: Secondary | ICD-10-CM

## 2022-06-10 DIAGNOSIS — F03A0 Unspecified dementia, mild, without behavioral disturbance, psychotic disturbance, mood disturbance, and anxiety: Secondary | ICD-10-CM | POA: Diagnosis not present

## 2022-06-10 DIAGNOSIS — R531 Weakness: Secondary | ICD-10-CM

## 2022-06-10 DIAGNOSIS — N1832 Chronic kidney disease, stage 3b: Secondary | ICD-10-CM | POA: Diagnosis not present

## 2022-06-10 DIAGNOSIS — J449 Chronic obstructive pulmonary disease, unspecified: Secondary | ICD-10-CM

## 2022-06-10 DIAGNOSIS — E1122 Type 2 diabetes mellitus with diabetic chronic kidney disease: Secondary | ICD-10-CM | POA: Diagnosis not present

## 2022-06-10 DIAGNOSIS — Z8673 Personal history of transient ischemic attack (TIA), and cerebral infarction without residual deficits: Secondary | ICD-10-CM | POA: Diagnosis not present

## 2022-06-10 DIAGNOSIS — I129 Hypertensive chronic kidney disease with stage 1 through stage 4 chronic kidney disease, or unspecified chronic kidney disease: Secondary | ICD-10-CM

## 2022-06-10 DIAGNOSIS — I5032 Chronic diastolic (congestive) heart failure: Secondary | ICD-10-CM | POA: Diagnosis not present

## 2022-06-10 NOTE — Assessment & Plan Note (Signed)
Under good control on current regimen. Continue current regimen. Continue to monitor. Call with any concerns. Refills up to date.   

## 2022-06-10 NOTE — Assessment & Plan Note (Signed)
Referral to home health placed today.

## 2022-06-10 NOTE — Assessment & Plan Note (Signed)
To be starting new medicine with next pill pack. Call with any concerns.

## 2022-06-10 NOTE — Assessment & Plan Note (Signed)
Would benefit from PT. Will get home health in. Referral generated today. Call with any concerns.

## 2022-06-10 NOTE — Progress Notes (Signed)
BP 122/64   Pulse 98   Temp 98 F (36.7 C)   Wt 249 lb 4.8 oz (113.1 kg)   SpO2 95%   BMI 35.77 kg/m    Subjective:    Patient ID: Justin Griffith, male    DOB: 08/17/1940, 82 y.o.   MRN: [id number]  HPI: Justin Griffith is a 82 y.o. male  Chief Complaint  Patient presents with   Hypertension   Wound improving on his back. Healing well. No pain.   Has been having a lot of discomfort on his bottom, no open wound, but has been quite painful.   HYPERTENSION  Hypertension status: better  Satisfied with current treatment? yes Duration of hypertension: chronic BP monitoring frequency:  not checking BP medication side effects:  no Medication compliance: excellent compliance Aspirin: no Recurrent headaches: no Visual changes: no Palpitations: no Dyspnea: no Chest pain: no Lower extremity edema: no Dizzy/lightheaded: no   Relevant past medical, surgical, family and social history reviewed and updated as indicated. Interim medical history since our last visit reviewed. Allergies and medications reviewed and updated.  Review of Systems  Constitutional: Negative.   Respiratory: Negative.    Cardiovascular: Negative.   Gastrointestinal: Negative.   Musculoskeletal: Negative.   Skin:  Positive for wound. Negative for color change, pallor and rash.  Psychiatric/Behavioral: Negative.      Per HPI unless specifically indicated above     Objective:    BP 122/64   Pulse 98   Temp 98 F (36.7 C)   Wt 249 lb 4.8 oz (113.1 kg)   SpO2 95%   BMI 35.77 kg/m   Wt Readings from Last 3 Encounters:  06/10/22 249 lb 4.8 oz (113.1 kg)  06/09/22 244 lb (110.7 kg)  06/08/22 245 lb (111.1 kg)    Physical Exam Vitals and nursing note reviewed.  Constitutional:      General: He is not in acute distress.    Appearance: Normal appearance. He is not ill-appearing, toxic-appearing or diaphoretic.  HENT:     Head: Normocephalic and atraumatic.     Right Ear: External ear normal.      Left Ear: External ear normal.     Nose: Nose normal.     Mouth/Throat:     Mouth: Mucous membranes are moist.     Pharynx: Oropharynx is clear.  Eyes:     General: No scleral icterus.       Right eye: No discharge.        Left eye: No discharge.     Extraocular Movements: Extraocular movements intact.     Conjunctiva/sclera: Conjunctivae normal.     Pupils: Pupils are equal, round, and reactive to light.  Cardiovascular:     Rate and Rhythm: Normal rate and regular rhythm.     Pulses: Normal pulses.     Heart sounds: Normal heart sounds. No murmur heard.    No friction rub. No gallop.  Pulmonary:     Effort: Pulmonary effort is normal. No respiratory distress.     Breath sounds: Normal breath sounds. No stridor. No wheezing, rhonchi or rales.  Chest:     Chest wall: No tenderness.  Musculoskeletal:        General: Normal range of motion.     Cervical back: Normal range of motion and neck supple.  Skin:    General: Skin is warm and dry.     Capillary Refill: Capillary refill takes less than 2 seconds.  Coloration: Skin is not jaundiced or pale.     Findings: No bruising, erythema, lesion or rash.     Comments: Wound on his back healing well. Redness on sacrum, pad covered in stool  Neurological:     General: No focal deficit present.     Mental Status: He is alert and oriented to person, place, and time. Mental status is at baseline.  Psychiatric:        Mood and Affect: Mood normal.        Behavior: Behavior normal.        Thought Content: Thought content normal.        Judgment: Judgment normal.     Results for orders placed or performed in visit on 06/02/22  Urine Culture   Specimen: Urine   UR  Result Value Ref Range   Urine Culture, Routine Final report    Organism ID, Bacteria Comment   Microscopic Examination   Urine  Result Value Ref Range   WBC, UA 0-5 0 - 5 /hpf   RBC, Urine 3-10 (A) 0 - 2 /hpf   Epithelial Cells (non renal) None seen 0 - 10  /hpf   Bacteria, UA None seen None seen/Few   Yeast, UA Present (A) None seen  Bayer DCA Hb A1c Waived  Result Value Ref Range   HB A1C (BAYER DCA - WAIVED) 11.5 (H) 4.8 - 5.6 %  Comprehensive metabolic panel  Result Value Ref Range   Glucose 484 (H) 70 - 99 mg/dL   BUN 35 (H) 8 - 27 mg/dL   Creatinine, Ser 1.59 (H) 0.76 - 1.27 mg/dL   eGFR 43 (L) >59 mL/min/1.73   BUN/Creatinine Ratio 22 10 - 24   Sodium 129 (L) 134 - 144 mmol/L   Potassium 4.3 3.5 - 5.2 mmol/L   Chloride 91 (L) 96 - 106 mmol/L   CO2 22 20 - 29 mmol/L   Calcium 10.0 8.6 - 10.2 mg/dL   Total Protein 6.9 6.0 - 8.5 g/dL   Albumin 3.6 (L) 3.7 - 4.7 g/dL   Globulin, Total 3.3 1.5 - 4.5 g/dL   Albumin/Globulin Ratio 1.1 (L) 1.2 - 2.2   Bilirubin Total 0.2 0.0 - 1.2 mg/dL   Alkaline Phosphatase 92 44 - 121 IU/L   AST 7 0 - 40 IU/L   ALT 12 0 - 44 IU/L  Lipid Panel w/o Chol/HDL Ratio  Result Value Ref Range   Cholesterol, Total 135 100 - 199 mg/dL   Triglycerides 249 (H) 0 - 149 mg/dL   HDL 34 (L) >39 mg/dL   VLDL Cholesterol Cal 40 5 - 40 mg/dL   LDL Chol Calc (NIH) 61 0 - 99 mg/dL  Urinalysis, Routine w reflex microscopic  Result Value Ref Range   Specific Gravity, UA 1.015 1.005 - 1.030   pH, UA 5.0 5.0 - 7.5   Color, UA Yellow Yellow   Appearance Ur Cloudy (A) Clear   Leukocytes,UA Trace (A) Negative   Protein,UA 3+ (A) Negative/Trace   Glucose, UA 2+ (A) Negative   Ketones, UA Negative Negative   RBC, UA 2+ (A) Negative   Bilirubin, UA Negative Negative   Urobilinogen, Ur 0.2 0.2 - 1.0 mg/dL   Nitrite, UA Negative Negative   Microscopic Examination See below:       Assessment & Plan:   Problem List Items Addressed This Visit       Cardiovascular and Mediastinum   Aortic atherosclerosis ([place]) - Primary    Will  keep BP and cholesterol under good control. Continue to monitor.       Relevant Orders   Ambulatory referral to [place]   Chronic diastolic CHF (congestive heart failure) ([place])     Euvolemic today. Continue to monitor. Call with any concerns.       Relevant Orders   Ambulatory referral to [place]     Respiratory   Chronic obstructive pulmonary disease (COPD) ([place])    Under good control on current regimen. Continue current regimen. Continue to monitor. Call with any concerns. Refills up to date.       Relevant Orders   Ambulatory referral to Home Health     Endocrine   Type II diabetes mellitus with renal manifestations ([place])    To be starting new medicine with next pill pack. Call with any concerns.       Relevant Orders   Ambulatory referral to [place] and [place]   Mild dementia without behavioral disturbance, psychotic disturbance, mood disturbance, or anxiety (H[facility])    Stable. Will get home health in. Continue to monitor.       Relevant Orders   Ambulatory referral to [place]     Genitourinary   Benign hypertensive renal disease    Under good control on current regimen. Continue current regimen. Continue to monitor. Call with any concerns. Refills up to date.         Other   History of CVA (cerebrovascular accident)    Would benefit from PT. Will get home health in. Referral generated today. Call with any concerns.       Relevant Orders   Ambulatory referral to [place] frequently    Would benefit from PT. Will get home health in. Referral generated today. Call with any concerns.       Relevant Orders   Ambulatory referral to Home Health   Generalized weakness    Would benefit from PT. Will get home health in. Referral generated today. Call with any concerns.       Pressure injury of sacral region, unstageable [facility])    Referral to home health placed today.        Follow up plan: Return 1-2 weeks.

## 2022-06-10 NOTE — Assessment & Plan Note (Signed)
Euvolemic today. Continue to monitor. Call with any concerns.  

## 2022-06-10 NOTE — Assessment & Plan Note (Signed)
Will keep BP and cholesterol under good control. Continue to monitor.  

## 2022-06-10 NOTE — Assessment & Plan Note (Signed)
Stable. Will get home health in. Continue to monitor.

## 2022-06-11 ENCOUNTER — Telehealth: Payer: Self-pay | Admitting: Family Medicine

## 2022-06-11 NOTE — Telephone Encounter (Signed)
Winston with [facility] states that he is admitting the pt to home health services and is needing to know the wound care orders for pt back, pt medications, and primary diagnosis.  Please call pt back at [phone]

## 2022-06-12 ENCOUNTER — Telehealth: Payer: Self-pay | Admitting: Family Medicine

## 2022-06-12 NOTE — Telephone Encounter (Signed)
All of this should have been sent with the note. I'm not sure what's going on here.   Back wound is just a bandaid.   Buttock irritation- can we please give the same orders as before for pressure ulcer.

## 2022-06-12 NOTE — Telephone Encounter (Signed)
Spoke with Adrian Blackwater from [facility] and was informed that the patient is being taken care of from [facility]. Spoke with Santiago Glad from [facility] to clarify the would be seeing the patient and Santiago Glad verbalized understanding. Per Adrian Blackwater, will discharge patient orders for the patient.

## 2022-06-12 NOTE — Telephone Encounter (Signed)
Santiago Glad with [facility] is returning Latisha's call. Please advise [phone]

## 2022-06-13 ENCOUNTER — Emergency Department
Admission: EM | Admit: 2022-06-13 | Discharge: 2022-06-13 | Disposition: A | Discharge status: Home / Self Care | Payer: PPO | Source: Home / Self Care | Attending: Emergency Medicine | Admitting: Emergency Medicine

## 2022-06-13 ENCOUNTER — Encounter: Payer: Self-pay | Admitting: Internal Medicine

## 2022-06-13 ENCOUNTER — Emergency Department: Payer: PPO

## 2022-06-13 ENCOUNTER — Inpatient Hospital Stay
Admission: EM | Admit: 2022-06-13 | Discharge: 2022-06-22 | DRG: 699 | Disposition: A | Discharge status: Home Health | Payer: PPO | Attending: Student | Admitting: Student

## 2022-06-13 ENCOUNTER — Other Ambulatory Visit: Payer: Self-pay

## 2022-06-13 DIAGNOSIS — T83091A Other mechanical complication of indwelling urethral catheter, initial encounter: Secondary | ICD-10-CM

## 2022-06-13 DIAGNOSIS — N401 Enlarged prostate with lower urinary tract symptoms: Secondary | ICD-10-CM | POA: Diagnosis present

## 2022-06-13 DIAGNOSIS — I2510 Atherosclerotic heart disease of native coronary artery without angina pectoris: Secondary | ICD-10-CM | POA: Diagnosis present

## 2022-06-13 DIAGNOSIS — E871 Hypo-osmolality and hyponatremia: Secondary | ICD-10-CM | POA: Diagnosis present

## 2022-06-13 DIAGNOSIS — I5032 Chronic diastolic (congestive) heart failure: Secondary | ICD-10-CM | POA: Diagnosis present

## 2022-06-13 DIAGNOSIS — L89151 Pressure ulcer of sacral region, stage 1: Secondary | ICD-10-CM | POA: Diagnosis present

## 2022-06-13 DIAGNOSIS — J4489 Other specified chronic obstructive pulmonary disease: Secondary | ICD-10-CM | POA: Diagnosis present

## 2022-06-13 DIAGNOSIS — N3001 Acute cystitis with hematuria: Secondary | ICD-10-CM

## 2022-06-13 DIAGNOSIS — F05 Delirium due to known physiological condition: Secondary | ICD-10-CM | POA: Diagnosis present

## 2022-06-13 DIAGNOSIS — J449 Chronic obstructive pulmonary disease, unspecified: Secondary | ICD-10-CM | POA: Diagnosis not present

## 2022-06-13 DIAGNOSIS — Z96653 Presence of artificial knee joint, bilateral: Secondary | ICD-10-CM | POA: Diagnosis present

## 2022-06-13 DIAGNOSIS — R310 Gross hematuria: Secondary | ICD-10-CM | POA: Diagnosis present

## 2022-06-13 DIAGNOSIS — N179 Acute kidney failure, unspecified: Secondary | ICD-10-CM | POA: Diagnosis present

## 2022-06-13 DIAGNOSIS — Z888 Allergy status to other drugs, medicaments and biological substances status: Secondary | ICD-10-CM

## 2022-06-13 DIAGNOSIS — Z6835 Body mass index (BMI) 35.0-35.9, adult: Secondary | ICD-10-CM

## 2022-06-13 DIAGNOSIS — Z8673 Personal history of transient ischemic attack (TIA), and cerebral infarction without residual deficits: Secondary | ICD-10-CM

## 2022-06-13 DIAGNOSIS — T83028A Displacement of other indwelling urethral catheter, initial encounter: Secondary | ICD-10-CM | POA: Diagnosis not present

## 2022-06-13 DIAGNOSIS — Z86711 Personal history of pulmonary embolism: Secondary | ICD-10-CM | POA: Diagnosis not present

## 2022-06-13 DIAGNOSIS — I2699 Other pulmonary embolism without acute cor pulmonale: Secondary | ICD-10-CM | POA: Diagnosis present

## 2022-06-13 DIAGNOSIS — J439 Emphysema, unspecified: Secondary | ICD-10-CM | POA: Diagnosis present

## 2022-06-13 DIAGNOSIS — R300 Dysuria: Secondary | ICD-10-CM | POA: Diagnosis not present

## 2022-06-13 DIAGNOSIS — N3289 Other specified disorders of bladder: Secondary | ICD-10-CM | POA: Diagnosis not present

## 2022-06-13 DIAGNOSIS — I130 Hypertensive heart and chronic kidney disease with heart failure and stage 1 through stage 4 chronic kidney disease, or unspecified chronic kidney disease: Secondary | ICD-10-CM | POA: Diagnosis present

## 2022-06-13 DIAGNOSIS — Y738 Miscellaneous gastroenterology and urology devices associated with adverse incidents, not elsewhere classified: Secondary | ICD-10-CM | POA: Diagnosis present

## 2022-06-13 DIAGNOSIS — D6832 Hemorrhagic disorder due to extrinsic circulating anticoagulants: Secondary | ICD-10-CM | POA: Diagnosis present

## 2022-06-13 DIAGNOSIS — Z9049 Acquired absence of other specified parts of digestive tract: Secondary | ICD-10-CM

## 2022-06-13 DIAGNOSIS — M1990 Unspecified osteoarthritis, unspecified site: Secondary | ICD-10-CM | POA: Diagnosis present

## 2022-06-13 DIAGNOSIS — E1165 Type 2 diabetes mellitus with hyperglycemia: Secondary | ICD-10-CM | POA: Diagnosis present

## 2022-06-13 DIAGNOSIS — R338 Other retention of urine: Secondary | ICD-10-CM | POA: Diagnosis present

## 2022-06-13 DIAGNOSIS — Z794 Long term (current) use of insulin: Secondary | ICD-10-CM | POA: Diagnosis not present

## 2022-06-13 DIAGNOSIS — N1832 Chronic kidney disease, stage 3b: Secondary | ICD-10-CM

## 2022-06-13 DIAGNOSIS — D519 Vitamin B12 deficiency anemia, unspecified: Secondary | ICD-10-CM | POA: Diagnosis present

## 2022-06-13 DIAGNOSIS — G4730 Sleep apnea, unspecified: Secondary | ICD-10-CM | POA: Diagnosis present

## 2022-06-13 DIAGNOSIS — Z951 Presence of aortocoronary bypass graft: Secondary | ICD-10-CM

## 2022-06-13 DIAGNOSIS — Z955 Presence of coronary angioplasty implant and graft: Secondary | ICD-10-CM

## 2022-06-13 DIAGNOSIS — R319 Hematuria, unspecified: Secondary | ICD-10-CM | POA: Diagnosis not present

## 2022-06-13 DIAGNOSIS — Z7901 Long term (current) use of anticoagulants: Secondary | ICD-10-CM

## 2022-06-13 DIAGNOSIS — L8990 Pressure ulcer of unspecified site, unspecified stage: Secondary | ICD-10-CM | POA: Insufficient documentation

## 2022-06-13 DIAGNOSIS — H918X9 Other specified hearing loss, unspecified ear: Secondary | ICD-10-CM | POA: Diagnosis present

## 2022-06-13 DIAGNOSIS — Z87891 Personal history of nicotine dependence: Secondary | ICD-10-CM

## 2022-06-13 DIAGNOSIS — F32A Depression, unspecified: Secondary | ICD-10-CM | POA: Diagnosis present

## 2022-06-13 DIAGNOSIS — T839XXA Unspecified complication of genitourinary prosthetic device, implant and graft, initial encounter: Secondary | ICD-10-CM | POA: Diagnosis not present

## 2022-06-13 DIAGNOSIS — E119 Type 2 diabetes mellitus without complications: Secondary | ICD-10-CM | POA: Insufficient documentation

## 2022-06-13 DIAGNOSIS — E669 Obesity, unspecified: Secondary | ICD-10-CM | POA: Diagnosis present

## 2022-06-13 DIAGNOSIS — Z833 Family history of diabetes mellitus: Secondary | ICD-10-CM

## 2022-06-13 DIAGNOSIS — G4733 Obstructive sleep apnea (adult) (pediatric): Secondary | ICD-10-CM | POA: Diagnosis present

## 2022-06-13 DIAGNOSIS — N390 Urinary tract infection, site not specified: Secondary | ICD-10-CM | POA: Diagnosis present

## 2022-06-13 DIAGNOSIS — N189 Chronic kidney disease, unspecified: Secondary | ICD-10-CM | POA: Insufficient documentation

## 2022-06-13 DIAGNOSIS — E1129 Type 2 diabetes mellitus with other diabetic kidney complication: Secondary | ICD-10-CM | POA: Diagnosis present

## 2022-06-13 DIAGNOSIS — Z7984 Long term (current) use of oral hypoglycemic drugs: Secondary | ICD-10-CM

## 2022-06-13 DIAGNOSIS — N138 Other obstructive and reflux uropathy: Secondary | ICD-10-CM | POA: Diagnosis not present

## 2022-06-13 DIAGNOSIS — E785 Hyperlipidemia, unspecified: Secondary | ICD-10-CM | POA: Diagnosis present

## 2022-06-13 DIAGNOSIS — T45515A Adverse effect of anticoagulants, initial encounter: Secondary | ICD-10-CM | POA: Diagnosis present

## 2022-06-13 DIAGNOSIS — Z8619 Personal history of other infectious and parasitic diseases: Secondary | ICD-10-CM

## 2022-06-13 DIAGNOSIS — R339 Retention of urine, unspecified: Secondary | ICD-10-CM | POA: Diagnosis present

## 2022-06-13 DIAGNOSIS — Z85038 Personal history of other malignant neoplasm of large intestine: Secondary | ICD-10-CM

## 2022-06-13 DIAGNOSIS — E1122 Type 2 diabetes mellitus with diabetic chronic kidney disease: Secondary | ICD-10-CM | POA: Diagnosis present

## 2022-06-13 DIAGNOSIS — T83021A Displacement of indwelling urethral catheter, initial encounter: Principal | ICD-10-CM | POA: Diagnosis present

## 2022-06-13 DIAGNOSIS — F0392 Unspecified dementia, unspecified severity, with psychotic disturbance: Secondary | ICD-10-CM | POA: Diagnosis present

## 2022-06-13 DIAGNOSIS — R21 Rash and other nonspecific skin eruption: Secondary | ICD-10-CM | POA: Diagnosis present

## 2022-06-13 DIAGNOSIS — N178 Other acute kidney failure: Secondary | ICD-10-CM | POA: Diagnosis not present

## 2022-06-13 DIAGNOSIS — I10 Essential (primary) hypertension: Secondary | ICD-10-CM | POA: Diagnosis present

## 2022-06-13 LAB — COMPREHENSIVE METABOLIC PANEL
ALT: 11 U/L (ref 0–44)
AST: 14 U/L — ABNORMAL LOW (ref 15–41)
Albumin: 3.3 g/dL — ABNORMAL LOW (ref 3.5–5.0)
Alkaline Phosphatase: 80 U/L (ref 38–126)
Anion gap: 11 (ref 5–15)
BUN: 29 mg/dL — ABNORMAL HIGH (ref 8–23)
CO2: 25 mmol/L (ref 22–32)
Calcium: 10 mg/dL (ref 8.9–10.3)
Chloride: 96 mmol/L — ABNORMAL LOW (ref 98–111)
Creatinine, Ser: 1.43 mg/dL — ABNORMAL HIGH (ref 0.61–1.24)
GFR, Estimated: 49 mL/min — ABNORMAL LOW (ref >60)
Glucose, Bld: 499 mg/dL — ABNORMAL HIGH (ref 70–99)
Potassium: 3.9 mmol/L (ref 3.5–5.1)
Sodium: 132 mmol/L — ABNORMAL LOW (ref 135–145)
Total Bilirubin: 0.6 mg/dL (ref 0.3–1.2)
Total Protein: 8 g/dL (ref 6.5–8.1)

## 2022-06-13 LAB — URINALYSIS, ROUTINE W REFLEX MICROSCOPIC
Bacteria, UA: NONE SEEN
Bilirubin Urine: NEGATIVE
Glucose, UA: >=500 mg/dL — AB
Ketones, ur: NEGATIVE mg/dL
Leukocytes,Ua: NEGATIVE
Nitrite: NEGATIVE
Protein, ur: 100 mg/dL — AB
RBC / HPF: >50 RBC/hpf (ref 0–5)
Specific Gravity, Urine: 1.009 (ref 1.005–1.030)
Squamous Epithelial / HPF: NONE SEEN /HPF (ref 0–5)
pH: 6 (ref 5.0–8.0)

## 2022-06-13 LAB — CBC WITH DIFFERENTIAL/PLATELET
Abs Immature Granulocytes: 0.06 10*3/uL (ref 0.00–0.07)
Basophils Absolute: 0.1 10*3/uL (ref 0.0–0.1)
Basophils Relative: 0 %
Eosinophils Absolute: 0.1 10*3/uL (ref 0.0–0.5)
Eosinophils Relative: 1 %
HCT: 35.7 % — ABNORMAL LOW (ref 39.0–52.0)
Hemoglobin: 11.3 g/dL — ABNORMAL LOW (ref 13.0–17.0)
Immature Granulocytes: 1 %
Lymphocytes Relative: 13 %
Lymphs Abs: 1.4 10*3/uL (ref 0.7–4.0)
MCH: 26.4 pg (ref 26.0–34.0)
MCHC: 31.7 g/dL (ref 30.0–36.0)
MCV: 83.4 fL (ref 80.0–100.0)
Monocytes Absolute: 0.8 10*3/uL (ref 0.1–1.0)
Monocytes Relative: 7 %
Neutro Abs: 9.1 10*3/uL — ABNORMAL HIGH (ref 1.7–7.7)
Neutrophils Relative %: 78 %
Platelets: 335 10*3/uL (ref 150–400)
RBC: 4.28 MIL/uL (ref 4.22–5.81)
RDW: 14.3 % (ref 11.5–15.5)
WBC: 11.5 10*3/uL — ABNORMAL HIGH (ref 4.0–10.5)
nRBC: 0 % (ref 0.0–0.2)

## 2022-06-13 LAB — GLUCOSE, CAPILLARY
Glucose-Capillary: 435 mg/dL — ABNORMAL HIGH (ref 70–99)
Glucose-Capillary: 384 mg/dL — ABNORMAL HIGH (ref 70–99)
Glucose-Capillary: 312 mg/dL — ABNORMAL HIGH (ref 70–99)

## 2022-06-13 LAB — PROTIME-INR
INR: 1.2 (ref 0.8–1.2)
Prothrombin Time: 15.5 seconds — ABNORMAL HIGH (ref 11.4–15.2)

## 2022-06-13 LAB — BRAIN NATRIURETIC PEPTIDE: B Natriuretic Peptide: 67.1 pg/mL (ref 0.0–100.0)

## 2022-06-13 LAB — APTT: aPTT: 29 seconds (ref 24–36)

## 2022-06-13 MED ORDER — SODIUM CHLORIDE 0.9 % IV SOLN: 1.0000 g | INTRAVENOUS | Status: DC

## 2022-06-13 MED ORDER — POLYETHYLENE GLYCOL 3350 17 GM/SCOOP PO POWD: 17.0000 g | Freq: Two times a day (BID) | ORAL | Status: DC | PRN

## 2022-06-13 MED ORDER — TORSEMIDE 20 MG PO TABS
40.0000 mg | ORAL_TABLET | Freq: Every day | ORAL | Status: DC
  Administered 2022-06-14 – 2022-06-17 (×4): 40 mg via ORAL
  Filled 2022-06-13 (×4): qty 2

## 2022-06-13 MED ORDER — HYDRALAZINE HCL 50 MG PO TABS
100.0000 mg | ORAL_TABLET | Freq: Three times a day (TID) | ORAL | Status: DC
  Administered 2022-06-13 – 2022-06-22 (×25): 100 mg via ORAL
  Filled 2022-06-13 (×25): qty 2

## 2022-06-13 MED ORDER — ONDANSETRON HCL 4 MG/2ML IJ SOLN: 4.0000 mg | Freq: Three times a day (TID) | INTRAMUSCULAR | Status: DC | PRN

## 2022-06-13 MED ORDER — INSULIN ASPART 100 UNIT/ML IJ SOLN
0.0000 [IU] | Freq: Three times a day (TID) | INTRAMUSCULAR | Status: DC
  Administered 2022-06-14: 5 [IU] via SUBCUTANEOUS
  Administered 2022-06-14 – 2022-06-15 (×3): 7 [IU] via SUBCUTANEOUS
  Administered 2022-06-15 – 2022-06-16 (×3): 5 [IU] via SUBCUTANEOUS
  Administered 2022-06-16 (×2): 7 [IU] via SUBCUTANEOUS
  Administered 2022-06-17 – 2022-06-18 (×4): 3 [IU] via SUBCUTANEOUS
  Administered 2022-06-18: 5 [IU] via SUBCUTANEOUS
  Administered 2022-06-18: 1 [IU] via SUBCUTANEOUS
  Administered 2022-06-19 (×2): 3 [IU] via SUBCUTANEOUS
  Administered 2022-06-19: 2 [IU] via SUBCUTANEOUS
  Administered 2022-06-20 (×2): 3 [IU] via SUBCUTANEOUS
  Administered 2022-06-20: 7 [IU] via SUBCUTANEOUS
  Administered 2022-06-21 (×2): 3 [IU] via SUBCUTANEOUS
  Administered 2022-06-21: 2 [IU] via SUBCUTANEOUS
  Administered 2022-06-22: 3 [IU] via SUBCUTANEOUS
  Filled 2022-06-13 (×22): qty 1

## 2022-06-13 MED ORDER — LOSARTAN POTASSIUM 50 MG PO TABS
100.0000 mg | ORAL_TABLET | Freq: Every day | ORAL | Status: DC
  Administered 2022-06-14 – 2022-06-22 (×9): 100 mg via ORAL
  Filled 2022-06-13 (×9): qty 2

## 2022-06-13 MED ORDER — UMECLIDINIUM BROMIDE 62.5 MCG/ACT IN AEPB
1.0000 | INHALATION_SPRAY | Freq: Every day | RESPIRATORY_TRACT | Status: DC
  Administered 2022-06-14 – 2022-06-22 (×9): 1 via RESPIRATORY_TRACT
  Filled 2022-06-13 (×2): qty 7

## 2022-06-13 MED ORDER — INSULIN ASPART 100 UNIT/ML IJ SOLN
0.0000 [IU] | Freq: Every day | INTRAMUSCULAR | Status: DC
  Administered 2022-06-13 – 2022-06-14 (×2): 4 [IU] via SUBCUTANEOUS
  Administered 2022-06-15 – 2022-06-16 (×2): 2 [IU] via SUBCUTANEOUS
  Administered 2022-06-17: 4 [IU] via SUBCUTANEOUS
  Administered 2022-06-19: 3 [IU] via SUBCUTANEOUS
  Administered 2022-06-20 – 2022-06-21 (×2): 2 [IU] via SUBCUTANEOUS
  Filled 2022-06-13 (×8): qty 1

## 2022-06-13 MED ORDER — LIDOCAINE HCL URETHRAL/MUCOSAL 2 % EX GEL
1.0000 | Freq: Once | CUTANEOUS | Status: AC
  Administered 2022-06-13: 1 via URETHRAL
  Filled 2022-06-13: qty 10

## 2022-06-13 MED ORDER — NICOTINE 21 MG/24HR TD PT24
21.0000 mg | MEDICATED_PATCH | Freq: Every day | TRANSDERMAL | Status: DC
  Filled 2022-06-13 (×2): qty 1

## 2022-06-13 MED ORDER — GABAPENTIN 400 MG PO CAPS
400.0000 mg | ORAL_CAPSULE | Freq: Three times a day (TID) | ORAL | Status: DC
  Administered 2022-06-14 – 2022-06-22 (×25): 400 mg via ORAL
  Filled 2022-06-13 (×25): qty 1

## 2022-06-13 MED ORDER — INSULIN ASPART 100 UNIT/ML IJ SOLN
10.0000 [IU] | Freq: Once | INTRAMUSCULAR | Status: AC
  Administered 2022-06-13: 10 [IU] via SUBCUTANEOUS
  Filled 2022-06-13: qty 1

## 2022-06-13 MED ORDER — HYDRALAZINE HCL 20 MG/ML IJ SOLN: 5.0000 mg | INTRAMUSCULAR | Status: DC | PRN

## 2022-06-13 MED ORDER — SODIUM CHLORIDE 0.9 % IV SOLN
1.0000 g | Freq: Once | INTRAVENOUS | Status: AC
  Administered 2022-06-13: 1 g via INTRAVENOUS
  Filled 2022-06-13: qty 10

## 2022-06-13 MED ORDER — OLOPATADINE HCL 0.1 % OP SOLN: 1.0000 [drp] | Freq: Two times a day (BID) | OPHTHALMIC | Status: DC | PRN

## 2022-06-13 MED ORDER — ACETAMINOPHEN 325 MG PO TABS: 650.0000 mg | ORAL_TABLET | Freq: Four times a day (QID) | ORAL | Status: DC | PRN

## 2022-06-13 MED ORDER — INSULIN GLARGINE-YFGN 100 UNIT/ML ~~LOC~~ SOLN
10.0000 [IU] | Freq: Every day | SUBCUTANEOUS | Status: DC
  Administered 2022-06-13 – 2022-06-14 (×2): 10 [IU] via SUBCUTANEOUS
  Filled 2022-06-13 (×2): qty 0.1

## 2022-06-13 MED ORDER — SODIUM CHLORIDE 0.9 % IR SOLN: 3000.0000 mL | Status: DC

## 2022-06-13 MED ORDER — PANTOPRAZOLE SODIUM 40 MG PO TBEC
40.0000 mg | DELAYED_RELEASE_TABLET | Freq: Every day | ORAL | Status: DC
  Administered 2022-06-14 – 2022-06-22 (×9): 40 mg via ORAL
  Filled 2022-06-13 (×9): qty 1

## 2022-06-13 MED ORDER — INSULIN ASPART 100 UNIT/ML IJ SOLN: 0.0000 [IU] | Freq: Three times a day (TID) | INTRAMUSCULAR | Status: DC

## 2022-06-13 MED ORDER — NYSTATIN 100000 UNIT/GM EX POWD
Freq: Two times a day (BID) | CUTANEOUS | Status: DC
  Filled 2022-06-13: qty 15

## 2022-06-13 MED ORDER — MORPHINE SULFATE (PF) 2 MG/ML IV SOLN
1.0000 mg | INTRAVENOUS | Status: DC | PRN
  Administered 2022-06-15: 1 mg via INTRAVENOUS
  Filled 2022-06-13: qty 1

## 2022-06-13 MED ORDER — SODIUM CHLORIDE 0.9 % IV SOLN
2.0000 g | INTRAVENOUS | Status: DC
  Administered 2022-06-14 – 2022-06-15 (×2): 2 g via INTRAVENOUS
  Filled 2022-06-13 (×3): qty 20

## 2022-06-13 MED ORDER — ALBUTEROL SULFATE (2.5 MG/3ML) 0.083% IN NEBU: 3.0000 mL | INHALATION_SOLUTION | RESPIRATORY_TRACT | Status: DC | PRN

## 2022-06-13 MED ORDER — SODIUM CHLORIDE 0.9 % IR SOLN
3000.0000 mL | Status: DC
  Administered 2022-06-13: 3000 mL

## 2022-06-13 MED ORDER — ADULT MULTIVITAMIN W/MINERALS CH
1.0000 | ORAL_TABLET | Freq: Every day | ORAL | Status: DC
  Administered 2022-06-14 – 2022-06-22 (×9): 1 via ORAL
  Filled 2022-06-13 (×9): qty 1

## 2022-06-13 MED ORDER — CLONIDINE HCL 0.1 MG PO TABS
0.1000 mg | ORAL_TABLET | Freq: Two times a day (BID) | ORAL | Status: DC
  Administered 2022-06-14 – 2022-06-22 (×16): 0.1 mg via ORAL
  Filled 2022-06-13 (×16): qty 1

## 2022-06-13 MED ORDER — HYDROMORPHONE HCL 1 MG/ML IJ SOLN
0.5000 mg | Freq: Once | INTRAMUSCULAR | Status: AC
  Administered 2022-06-13: 0.5 mg via INTRAVENOUS
  Filled 2022-06-13: qty 0.5

## 2022-06-13 MED ORDER — ROSUVASTATIN CALCIUM 10 MG PO TABS
10.0000 mg | ORAL_TABLET | Freq: Every day | ORAL | Status: DC
  Administered 2022-06-13 – 2022-06-21 (×9): 10 mg via ORAL
  Filled 2022-06-13 (×9): qty 1

## 2022-06-13 MED ORDER — OLOPATADINE HCL 0.1 % OP SOLN: 1.0000 [drp] | Freq: Two times a day (BID) | OPHTHALMIC | Status: DC

## 2022-06-13 MED ORDER — DM-GUAIFENESIN ER 30-600 MG PO TB12: 1.0000 | ORAL_TABLET | Freq: Two times a day (BID) | ORAL | Status: DC | PRN

## 2022-06-13 NOTE — ED Notes (Signed)
Foley pushed further into bladder per CT scan.

## 2022-06-13 NOTE — ED Triage Notes (Addendum)
Pt states foley catheter was changed Friday and states it a blood clot last night and hasn't drained since. Pt cursing and c/o severe pain.

## 2022-06-13 NOTE — ED Provider Notes (Signed)
[facility] Provider Note    Event Date/Time   First MD Initiated Contact with Patient 06/13/22 1319     (approximate)   History   catheter issues   HPI  Justin Griffith is a 82 y.o. male  \with type 2 diabetes, COPD, chronic indwelling Foley catheter, CKD who comes in with concerns for Foley catheter not draining.  On review of records patient was seen earlier today a new catheter was replaced but he unfortunately is often able to urinate with new catheter in.  Patient does have some blood noted.  States that he feels like he cannot get his urine out and is burning.   Physical Exam   Triage Vital Signs: ED Triage Vitals [06/13/22 1309]  Enc Vitals Group     BP (!) 200/114     Pulse Rate (!) 133     Resp 20     Temp 98 F (36.7 C)     Temp Source Oral     SpO2 93 %     Weight      Height      Head Circumference      Peak Flow      Pain Score 9     Pain Loc      Pain Edu?      Excl. in [place]?     Most recent vital signs: Vitals:   06/13/22 1309  BP: (!) 200/114  Pulse: (!) 133  Resp: 20  Temp: 98 F (36.7 C)  SpO2: 93%     General: Awake, patient is writhing in pain stating severe pain in his penis. CV:  Good peripheral perfusion.  Resp:  Normal effort.  Abd:  No distention.  Other:  He has rash in his left groin.  Blood noted around his urethral meatus.  Abdomen soft nontender   ED Results / Procedures / Treatments   Labs (all labs ordered are listed, but only abnormal results are displayed) Labs Reviewed  CBC WITH DIFFERENTIAL/PLATELET  COMPREHENSIVE METABOLIC PANEL     EKG  My interpretation of EKG:    RADIOLOGY Pending    PROCEDURES:  Critical Care performed: No  Procedures   MEDICATIONS ORDERED IN ED: Medications  lidocaine (XYLOCAINE) 2 % jelly 1 Application (1 Application Urethral Given 06/13/22 1422)     IMPRESSION / MDM / ASSESSMENT AND PLAN / ED COURSE  I reviewed the triage vital signs and the  nursing notes.   Patient's presentation is most consistent with acute presentation with potential threat to life or bodily function.   Comes in with concern for urinary retention.  His bladder scan does not seem to show any obvious retention I tried to discuss with patient I was going to get an ultrasound to get a better look at what was going on but patient is just Screaming to take the Foley out and to put a different Foley in.  Explained to patient that we have to put a three-way catheter in for irrigation and he stated that he understood that he did not want this catheter in anymore and wanted a new 1 in.  Upon replacing the catheter he did have some blood clots come out with concern for potential retention however I did look with an ultrasound afterwards and his bladder seems to be decompressed but there may be some clots and there.  She continues to report severe burning and discomfort so I have ordered some IV Dilaudid will get labs, urine and  CT renal to evaluate for any kidney stone that could be causing discomfort.  Patient will be handed off to oncoming team pending these results.  We will attempt to do some irrigation to see if the catheter can flow  The patient is on the cardiac monitor to evaluate for evidence of arrhythmia and/or significant heart rate changes.      FINAL CLINICAL IMPRESSION(S) / ED DIAGNOSES   Final diagnoses:  Hematuria, unspecified type     Rx / DC Orders   ED Discharge Orders     None        Note:  This document was prepared using Dragon voice recognition software and may include unintentional dictation errors.   Vanessa Verdon, MD 06/13/22 [phone]

## 2022-06-13 NOTE — Consult Note (Signed)
Subjective:    Consult requested by Ivor Costa MD  I was asked to see Justin Griffith for an obstructed catheter.   He has a history of chronic retention with a 226ml prostate.  He was to have a HOLEP procedure last year but had a PE was placed on anticoagulation and has had severe episodes of clot retention.   About a month ago he finally had the Prostatic artery embolization that had been recommended several months ago but the patient didn't follow up initially.  He is scheduled for a voiding trial next week but was in the ER Friday with catheter obstruction and then returned this morning.  A CT was done that showed the catheter balloon in the mid prostatic urethra.  The catheter was advanced in the ER but there were still some issues with drainage when I was call, but when I got to the room, the foley was draining light pink urine and the patient was quite comfortable.       ROS:  Review of Systems  All other systems reviewed and are negative.   Allergies  Allergen Reactions   Farxiga [Dapagliflozin] Rash    Causes a severe rash in groin area    Dulaglutide Diarrhea and Other (See Comments)   Liraglutide Diarrhea and Other (See Comments)   Jardiance [Empagliflozin] Rash    Past Medical History:  Diagnosis Date   Acute urinary retention    Arthritis    Asthma 2000   Back pain    Colon polyp 2012   COPD (chronic obstructive pulmonary disease) (HCC)    Emphysema of lung (HCC)    Heart murmur    Hernia 2000   History of cold sores 05/06/2020   History of colon cancer    Hyperlipidemia    Hypertension    Personal history of malignant neoplasm of large intestine    Personal history of tobacco use, presenting hazards to health    Septic shock ([place]) 11/18/2021   Sleep apnea    Special screening for malignant neoplasms, colon    Type 2 diabetes mellitus with proteinuria ([place]) 10/11/2014    Past Surgical History:  Procedure Laterality Date   CARDIAC CATHETERIZATION Left 09/25/2015    Procedure: Left Heart Cath and Coronary Angiography;  Surgeon: Yolonda Kida, MD;  Location: [place] [facility];  Service: Cardiovascular;  Laterality: Left;   CHOLECYSTECTOMY  1970   COLON SURGERY  07-16-99   sigmoid colon resection with primary anastomosis, chemotherapy for metastatic disease   COLONOSCOPY  2001, 2012   Dr Bary Castilla, tubular adenoma of the cecum and ascending colon in 2012.   COLONOSCOPY WITH PROPOFOL N/A 02/12/2016   Procedure: COLONOSCOPY WITH PROPOFOL;  Surgeon: Robert Bellow, MD;  Location: [facility];  Service: Endoscopy;  Laterality: N/A;   CORONARY STENT INTERVENTION N/A 05/17/2020   Procedure: CORONARY STENT INTERVENTION;  Surgeon: Yolonda Kida, MD;  Location: [place] [facility];  Service: Cardiovascular;  Laterality: N/A;  LAD   ESOPHAGOGASTRODUODENOSCOPY (EGD) WITH PROPOFOL N/A 02/12/2016   Procedure: ESOPHAGOGASTRODUODENOSCOPY (EGD) WITH PROPOFOL;  Surgeon: Robert Bellow, MD;  Location: [facility];  Service: Endoscopy;  Laterality: N/A;   HERNIA REPAIR Right    right inguinal hernia repair   IR 3D INDEPENDENT WKST  05/07/2022   IR ANGIOGRAM PELVIS SELECTIVE OR SUPRASELECTIVE  05/07/2022   IR ANGIOGRAM PELVIS SELECTIVE OR SUPRASELECTIVE  05/07/2022   IR ANGIOGRAM SELECTIVE EACH ADDITIONAL VESSEL  05/07/2022   IR EMBO ARTERIAL NOT HEMORR HEMANG  INC GUIDE ROADMAPPING  05/07/2022   IR RADIOLOGIST EVAL & MGMT  02/27/2022   IR RADIOLOGIST EVAL & MGMT  03/25/2022   IR RADIOLOGIST EVAL & MGMT  06/01/2022   IR US GUIDE VASC ACCESS LEFT  05/07/2022   JOINT REPLACEMENT Bilateral    knee replacement   KNEE SURGERY Bilateral 2010   LEFT HEART CATH AND CORONARY ANGIOGRAPHY N/A 05/17/2020   Procedure: LEFT HEART CATH AND CORONARY ANGIOGRAPHY possible PCI and stent;  Surgeon: Yolonda Kida, MD;  Location: [place] [facility];  Service: Cardiovascular;  Laterality: N/A;   MYRINGOTOMY WITH TUBE PLACEMENT Bilateral 08/16/2014   Procedure: MYRINGOTOMY  WITH TUBE PLACEMENT;  Surgeon: Carloyn Manner, MD;  Location: [facility];  Service: ENT;  Laterality: Bilateral;   PERIPHERAL VASCULAR CATHETERIZATION Right 09/04/2015   Procedure: Lower Extremity Angiography;  Surgeon: Katha Cabal, MD;  Location: [place] [facility];  Service: Cardiovascular;  Laterality: Right;   PERIPHERAL VASCULAR CATHETERIZATION  09/04/2015   Procedure: Lower Extremity Intervention;  Surgeon: Katha Cabal, MD;  Location: [place] [facility];  Service: Cardiovascular;;   TONSILLECTOMY     TYMPANOSTOMY TUBE PLACEMENT      Social History   Socioeconomic History   Marital status: Married    Spouse name: Not on file   Number of children: Not on file   Years of education: Not on file   Highest education level: GED or equivalent  Occupational History   Occupation: retired    Comment: [employer]  Tobacco Use   Smoking status: Former    Packs/day: 1.00    Years: 30.00    Additional pack years: 0.00    Total pack years: 30.00    Types: Cigarettes    Quit date: 03/23/1989    Years since quitting: 33.2    Passive exposure: Past   Smokeless tobacco: Never  Vaping Use   Vaping Use: Never used  Substance and Sexual Activity   Alcohol use: No    Alcohol/week: 0.0 standard drinks of alcohol   Drug use: No   Sexual activity: Not Currently  Other Topics Concern   Not on file  Social History Narrative   [employer]    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    Social Determinants of Health   Financial Resource Strain: Low Risk  (03/03/2022)   Overall Financial Resource Strain (CARDIA)    Difficulty of Paying Living Expenses: Not hard at all  Food Insecurity: No Food Insecurity (03/03/2022)   Hunger Vital Sign    Worried About Running Out of Food in the Last Year: Never true    [place] in the Last Year: Never true  Transportation Needs: No Transportation Needs (03/03/2022)   PRAPARE - Hydrologist (Medical): No    Lack of Transportation  (Non-Medical): No  Physical Activity: Inactive (03/03/2022)   Exercise Vital Sign    Days of Exercise per Week: 0 days    Minutes of Exercise per Session: 0 min  Stress: No Stress Concern Present (03/03/2022)   Strum    Feeling of Stress : Only a little  Social Connections: Moderately Integrated (03/03/2022)   Social Connection and Isolation Panel [NHANES]    Frequency of Communication with Friends and Family: More than three times a week    Frequency of Social Gatherings with Friends and Family: More than three times a week    Attends Religious Services: More than 4  times per year    Active Member of Clubs or Organizations: No    Attends Archivist Meetings: Never    Marital Status: Married  Human resources officer Violence: Not At Risk (03/03/2022)   Humiliation, Afraid, Rape, and Kick questionnaire    Fear of Current or Ex-Partner: No    Emotionally Abused: No    Physically Abused: No    Sexually Abused: No    Family History  Problem Relation Age of Onset   Diabetes Father    Esophageal cancer Mother    Alzheimer's disease Paternal Uncle     Anti-infectives: Anti-infectives (From admission, onward)    Start     Dose/Rate Route Frequency Ordered Stop   06/14/22 1600  cefTRIAXone (ROCEPHIN) 2 g in sodium chloride 0.9 % 100 mL IVPB        2 g 200 mL/hr over 30 Minutes Intravenous Every 24 hours 06/13/22 1621     06/13/22 1630  cefTRIAXone (ROCEPHIN) 1 g in sodium chloride 0.9 % 100 mL IVPB  Status:  Discontinued        1 g 200 mL/hr over 30 Minutes Intravenous Every 24 hours 06/13/22 1620 06/13/22 1621   06/13/22 1630  cefTRIAXone (ROCEPHIN) 1 g in sodium chloride 0.9 % 100 mL IVPB        1 g 200 mL/hr over 30 Minutes Intravenous  Once 06/13/22 1621 06/13/22 1701   06/13/22 1545  cefTRIAXone (ROCEPHIN) 1 g in sodium chloride 0.9 % 100 mL IVPB        1 g 200 mL/hr over 30 Minutes Intravenous  Once  06/13/22 1543 06/13/22 1615       Current Facility-Administered Medications  Medication Dose Route Frequency Provider Last Rate Last Admin   acetaminophen (TYLENOL) tablet 650 mg  650 mg Oral Q6H PRN Ivor Costa, MD       albuterol (PROVENTIL) (2.5 MG/3ML) 0.083% nebulizer solution 3 mL  3 mL Inhalation Q4H PRN Ivor Costa, MD       Derrill Memo ON 06/14/2022] cefTRIAXone (ROCEPHIN) 2 g in sodium chloride 0.9 % 100 mL IVPB  2 g Intravenous Q24H Ivor Costa, MD       dextromethorphan-guaiFENesin ([place] DM) 30-600 MG per 12 hr tablet 1 tablet  1 tablet Oral BID PRN Ivor Costa, MD       hydrALAZINE (APRESOLINE) injection 5 mg  5 mg Intravenous Q2H PRN Ivor Costa, MD       insulin aspart (novoLOG) injection 0-5 Units  0-5 Units Subcutaneous QHS Ivor Costa, MD       insulin aspart (novoLOG) injection 0-9 Units  0-9 Units Subcutaneous TID WC Ivor Costa, MD       insulin aspart (novoLOG) injection 10 Units  10 Units Subcutaneous Once Ivor Costa, MD       insulin glargine-yfgn (SEMGLEE) injection 10 Units  10 Units Subcutaneous Daily Ivor Costa, MD       morphine (PF) 2 MG/ML injection 1 mg  1 mg Intravenous Q4H PRN Ivor Costa, MD       nicotine (NICODERM CQ - dosed in mg/24 hours) patch 21 mg  21 mg Transdermal Daily Ivor Costa, MD       ondansetron [facility]) injection 4 mg  4 mg Intravenous Q8H PRN Ivor Costa, MD         Objective: Vital signs in last 24 hours: BP 120/63 (BP Location: Right Arm)   Pulse 81   Temp 98 F (36.7 C) (Oral)   Resp 18  SpO2 100%   Intake/Output from previous day: No intake/output data recorded. Intake/Output this shift: Total I/O In: -  Out: 1100 [Urine:1100]   Physical Exam Vitals reviewed.  Constitutional:      Appearance: Normal appearance. He is obese.  Abdominal:     Palpations: Abdomen is soft.     Tenderness: There is no abdominal tenderness.  Genitourinary:    Comments: The foley is inserted almost to the hub but I deflated the balloon and advanced  it further to be safe and it went easily.  The balloon was reinflated.  There is some blood at the meatus.  The penis and genitalia are otherwise normal.  Neurological:     Mental Status: He is alert.     Lab Results:  Results for orders placed or performed during the hospital encounter of 06/13/22 (from the past 24 hour(s))  CBC with Differential     Status: Abnormal   Collection Time: 06/13/22  2:54 PM  Result Value Ref Range   WBC 11.5 (H) 4.0 - 10.5 K/uL   RBC 4.28 4.22 - 5.81 MIL/uL   Hemoglobin 11.3 (L) 13.0 - 17.0 g/dL   HCT 35.7 (L) 39.0 - 52.0 %   MCV 83.4 80.0 - 100.0 fL   MCH 26.4 26.0 - 34.0 pg   MCHC 31.7 30.0 - 36.0 g/dL   RDW 14.3 11.5 - 15.5 %   Platelets 335 150 - 400 K/uL   nRBC 0.0 0.0 - 0.2 %   Neutrophils Relative % 78 %   Neutro Abs 9.1 (H) 1.7 - 7.7 K/uL   Lymphocytes Relative 13 %   Lymphs Abs 1.4 0.7 - 4.0 K/uL   Monocytes Relative 7 %   Monocytes Absolute 0.8 0.1 - 1.0 K/uL   Eosinophils Relative 1 %   Eosinophils Absolute 0.1 0.0 - 0.5 K/uL   Basophils Relative 0 %   Basophils Absolute 0.1 0.0 - 0.1 K/uL   Immature Granulocytes 1 %   Abs Immature Granulocytes 0.06 0.00 - 0.07 K/uL  Comprehensive metabolic panel     Status: Abnormal   Collection Time: 06/13/22  2:54 PM  Result Value Ref Range   Sodium 132 (L) 135 - 145 mmol/L   Potassium 3.9 3.5 - 5.1 mmol/L   Chloride 96 (L) 98 - 111 mmol/L   CO2 25 22 - 32 mmol/L   Glucose, Bld 499 (H) 70 - 99 mg/dL   BUN 29 (H) 8 - 23 mg/dL   Creatinine, Ser 1.43 (H) 0.61 - 1.24 mg/dL   Calcium 10.0 8.9 - 10.3 mg/dL   Total Protein 8.0 6.5 - 8.1 g/dL   Albumin 3.3 (L) 3.5 - 5.0 g/dL   AST 14 (L) 15 - 41 U/L   ALT 11 0 - 44 U/L   Alkaline Phosphatase 80 38 - 126 U/L   Total Bilirubin 0.6 0.3 - 1.2 mg/dL   GFR, Estimated 49 (L) >60 mL/min   Anion gap 11 5 - 15  Urinalysis, Routine w reflex microscopic -Urine, Clean Catch     Status: Abnormal   Collection Time: 06/13/22  2:54 PM  Result Value Ref Range    Color, Urine YELLOW (A) YELLOW   APPearance HAZY (A) CLEAR   Specific Gravity, Urine 1.009 1.005 - 1.030   pH 6.0 5.0 - 8.0   Glucose, UA >=500 (A) NEGATIVE mg/dL   Hgb urine dipstick LARGE (A) NEGATIVE   Bilirubin Urine NEGATIVE NEGATIVE   Ketones, ur NEGATIVE NEGATIVE mg/dL  Protein, ur 100 (A) NEGATIVE mg/dL   Nitrite NEGATIVE NEGATIVE   Leukocytes,Ua NEGATIVE NEGATIVE   RBC / HPF >50 0 - 5 RBC/hpf   WBC, UA 21-50 0 - 5 WBC/hpf   Bacteria, UA NONE SEEN NONE SEEN   Squamous Epithelial / HPF NONE SEEN 0 - 5 /HPF  Brain natriuretic peptide     Status: None   Collection Time: 06/13/22  4:40 PM  Result Value Ref Range   B Natriuretic Peptide 67.1 0.0 - 100.0 pg/mL  Glucose, capillary     Status: Abnormal   Collection Time: 06/13/22  5:48 PM  Result Value Ref Range   Glucose-Capillary 435 (H) 70 - 99 mg/dL    BMET Recent Labs    06/13/22 1454  NA 132*  K 3.9  CL 96*  CO2 25  GLUCOSE 499*  BUN 29*  CREATININE 1.43*  CALCIUM 10.0   PT/INR No results for input(s): "LABPROT", "INR" in the last 72 hours. ABG No results for input(s): "PHART", "HCO3" in the last 72 hours.  Invalid input(s): "PCO2", "PO2"  Studies/Results: CT Renal Stone Study  Result Date: 06/13/2022 CLINICAL DATA:  Abdominal and flank pain suspected kidney stone, hematuria, has catheter EXAM: CT ABDOMEN AND PELVIS WITHOUT CONTRAST TECHNIQUE: Multidetector CT imaging of the abdomen and pelvis was performed following the standard protocol without IV contrast. RADIATION DOSE REDUCTION: This exam was performed according to the departmental dose-optimization program which includes automated exposure control, adjustment of the mA and/or kV according to patient size and/or use of iterative reconstruction technique. COMPARISON:  01/30/2022 FINDINGS: Lower chest: Mild dependent atelectasis at lung bases. Hepatobiliary: Gallbladder surgically absent. Liver normal appearance. No biliary dilatation. Pancreas: Normal  appearance Spleen: Normal appearance Adrenals/Urinary Tract: Adrenal glands normal appearance. BILATERAL perinephric stranding increased from previous exam. No hydronephrosis or hydroureter. Bladder is decompressed, contains minimal urine and air. Mild surrounding perivesicular infiltrative changes. Foley catheter balloon inflated within prostate gland. Stomach/Bowel: Scattered stool throughout colon. Sigmoid anastomosis. Bowel loops otherwise unremarkable. Stomach normal appearance. Appendix not visualized. Vascular/Lymphatic: Atherosclerotic calcifications aorta and iliac arteries without aneurysm. Additional vascular calcifications at coronary arteries and visceral arteries. No adenopathy. Reproductive: Enlarged prostate gland unchanged. Other: No free air or free fluid.  No hernia. Musculoskeletal: Osseous structures unremarkable. IMPRESSION: Foley catheter balloon inflated within prostate gland; recommend repositioning into bladder. Decompressed bladder with perivesicular stranding, as well as BILATERAL perinephric stranding, urinary tract infection/cystitis not excluded, recommend correlation with urinalysis. Enlarged prostate gland unchanged. No acute intra-abdominal or intrapelvic abnormalities. Aortic Atherosclerosis (ICD10-I70.0). Electronically Signed   By: Lavonia Dana M.D.   On: 06/13/2022 15:34     Assessment/Plan: BPH with a massive prostate and chronic retention now s/p PAE with hematuria on Eliquis  and a malpositioned foley catheter.  His foley was advanced in the ER and is now draining light pink urine.  He has had a couple of hundred cc's out.   I would just initiate the CBI that has been ordered and hand irrigate prn.   He should plan to keep his f/u with Dr. Diamantina Providence for the voiding trial.          No follow-ups on file.    CC: Dr. Ivor Costa and Dr. Nickolas Madrid.      Irine Seal 06/13/2022

## 2022-06-13 NOTE — ED Notes (Signed)
Patient transported to CT 

## 2022-06-13 NOTE — H&P (Signed)
History and Physical    Justin Griffith [id number] DOB: 11-28-1940 DOA: 06/13/2022  Referring MD/NP/PA:   PCP: Valerie Roys, DO   Patient coming from:  The patient is coming from home.  At baseline, pt is independent for most of ADL.        Chief Complaint: Foley cath not draining  HPI: Justin Griffith is a 82 y.o. male with medical history significant of hypertension, hyperlipidemia, diabetes mellitus, COPD/asthma, stroke, depression, OSA, colon cancer, BPH with urinary retention with chronic indwelling catheter placement, PE on Eliquis, obesity, CAD, CABG, CKD-3B, dCHF, PAD, chronic indwelling Foley cath, who presents to ED due to Foley cath not draining.  Pt has chronic recurrent gross hematuria. He has chronic indwelling Foley catheter placement, but still developes recurrent urinary retention due to blood clot obstruction. Pt was seen in ED this AM due to the problems of catheter not draining. His Foley catheter was changed in ED. Pt comes back this afternoon since his Foley catheter stopped draining again. Per RN's report, when his Foley is removed, pt passed three blood clots each about the size of a nickle. Pt also oozing blood from his penis. 3-way foley Foley cath states the place it.  Initially patient has urine drainage, but then stopped draining again.  Patient reports dysuria and burning on urination.  No nausea vomiting, diarrhea or abdominal pain.  No chest pain, cough, shortness breath.  No fever or chills.  He took his Eliquis this morning.  Data reviewed independently and ED Course: pt was found to have WBC 11.5, hemoglobin 11.3 (9.1 on 05/07/2022), stable renal function, urinalysis (hazy appearance, negative leukocyte, negative bacteria, WBC 21-50, RBC> 50/hpf).  Temperature normal, blood pressure 200/114, 116/65, heart rate 101, RR 20, oxygen saturation 97% on room air.  Patient is placed on MedSurg bed for observation.   EKG:  Not done in ED, will get one.    Review  of Systems:   General: no fevers, chills, no body weight gain, fatigue HEENT: no blurry vision, hearing changes or sore throat Respiratory: no dyspnea, coughing, wheezing CV: no chest pain, no palpitations GI: no nausea, vomiting, abdominal pain, diarrhea, constipation GU: has dysuria, burning on urination, hematuria  Ext: no leg edema Neuro: no unilateral weakness, numbness, or tingling, no vision change or hearing loss Skin: no rash, no skin tear. MSK: No muscle spasm, no deformity, no limitation of range of movement in spin Heme: No easy bruising.  Travel history: No recent long distant travel.   Allergy:  Allergies  Allergen Reactions   Farxiga [Dapagliflozin] Rash    Causes a severe rash in groin area    Dulaglutide Diarrhea and Other (See Comments)   Liraglutide Diarrhea and Other (See Comments)   Jardiance [Empagliflozin] Rash    Past Medical History:  Diagnosis Date   Acute urinary retention    Arthritis    Asthma 2000   Back pain    Colon polyp 2012   COPD (chronic obstructive pulmonary disease) (HCC)    Emphysema of lung (HCC)    Heart murmur    Hernia 2000   History of cold sores 05/06/2020   History of colon cancer    Hyperlipidemia    Hypertension    Personal history of malignant neoplasm of large intestine    Personal history of tobacco use, presenting hazards to health    Septic shock ([place]) 11/18/2021   Sleep apnea    Special screening for malignant neoplasms, colon  Type 2 diabetes mellitus with proteinuria ([place]) 10/11/2014    Past Surgical History:  Procedure Laterality Date   CARDIAC CATHETERIZATION Left 09/25/2015   Procedure: Left Heart Cath and Coronary Angiography;  Surgeon: Yolonda Kida, MD;  Location: [place] [facility];  Service: Cardiovascular;  Laterality: Left;   CHOLECYSTECTOMY  1970   COLON SURGERY  07-16-99   sigmoid colon resection with primary anastomosis, chemotherapy for metastatic disease   COLONOSCOPY  2001, 2012    Dr Bary Castilla, tubular adenoma of the cecum and ascending colon in 2012.   COLONOSCOPY WITH PROPOFOL N/A 02/12/2016   Procedure: COLONOSCOPY WITH PROPOFOL;  Surgeon: Robert Bellow, MD;  Location: [facility];  Service: Endoscopy;  Laterality: N/A;   CORONARY STENT INTERVENTION N/A 05/17/2020   Procedure: CORONARY STENT INTERVENTION;  Surgeon: Yolonda Kida, MD;  Location: [place] [facility];  Service: Cardiovascular;  Laterality: N/A;  LAD   ESOPHAGOGASTRODUODENOSCOPY (EGD) WITH PROPOFOL N/A 02/12/2016   Procedure: ESOPHAGOGASTRODUODENOSCOPY (EGD) WITH PROPOFOL;  Surgeon: Robert Bellow, MD;  Location: [facility] ENDOSCOPY;  Service: Endoscopy;  Laterality: N/A;   HERNIA REPAIR Right    right inguinal hernia repair   IR 3D INDEPENDENT WKST  05/07/2022   IR ANGIOGRAM PELVIS SELECTIVE OR SUPRASELECTIVE  05/07/2022   IR ANGIOGRAM PELVIS SELECTIVE OR SUPRASELECTIVE  05/07/2022   IR ANGIOGRAM SELECTIVE EACH ADDITIONAL VESSEL  05/07/2022   IR EMBO ARTERIAL NOT HEMORR HEMANG INC GUIDE ROADMAPPING  05/07/2022   IR RADIOLOGIST EVAL & MGMT  02/27/2022   IR RADIOLOGIST EVAL & MGMT  03/25/2022   IR RADIOLOGIST EVAL & MGMT  06/01/2022   IR US GUIDE VASC ACCESS LEFT  05/07/2022   JOINT REPLACEMENT Bilateral    knee replacement   KNEE SURGERY Bilateral 2010   LEFT HEART CATH AND CORONARY ANGIOGRAPHY N/A 05/17/2020   Procedure: LEFT HEART CATH AND CORONARY ANGIOGRAPHY possible PCI and stent;  Surgeon: Yolonda Kida, MD;  Location: [place] [facility];  Service: Cardiovascular;  Laterality: N/A;   MYRINGOTOMY WITH TUBE PLACEMENT Bilateral 08/16/2014   Procedure: MYRINGOTOMY WITH TUBE PLACEMENT;  Surgeon: Carloyn Manner, MD;  Location: [facility];  Service: ENT;  Laterality: Bilateral;   PERIPHERAL VASCULAR CATHETERIZATION Right 09/04/2015   Procedure: Lower Extremity Angiography;  Surgeon: Katha Cabal, MD;  Location: [place] [facility];  Service: Cardiovascular;  Laterality: Right;   PERIPHERAL  VASCULAR CATHETERIZATION  09/04/2015   Procedure: Lower Extremity Intervention;  Surgeon: Katha Cabal, MD;  Location: [place] [facility];  Service: Cardiovascular;;   TONSILLECTOMY     TYMPANOSTOMY TUBE PLACEMENT      Social History:  reports that he quit smoking about 33 years ago. His smoking use included cigarettes. He has a 30.00 pack-year smoking history. He has been exposed to tobacco smoke. He has never used smokeless tobacco. He reports that he does not drink alcohol and does not use drugs.  Family History:  Family History  Problem Relation Age of Onset   Diabetes Father    Esophageal cancer Mother    Alzheimer's disease Paternal Uncle      Prior to Admission medications   Medication Sig Start Date End Date Taking? Authorizing Provider  acyclovir (ZOVIRAX) 400 MG tablet Take 1 tablet (400 mg total) by mouth 2 (two) times daily. 03/26/22   Johnson, Megan P, DO  apixaban (ELIQUIS) 2.5 MG TABS tablet Take 1 tablet (2.5 mg total) by mouth 2 (two) times daily. For blood thinner 04/09/22   Wynetta Emery,  Megan P, DO  blood glucose meter kit and supplies 1 each by Other route daily. Dispense based on patient and insurance preference. Use up to four times daily as directed. (FOR ICD-10 E10.9, E11.9). 02/10/22   Johnson, Megan P, DO  cloNIDine (CATAPRES) 0.1 MG tablet Take 1 tablet (0.1 mg total) by mouth 2 (two) times daily. For blood pressure 04/09/22   Johnson, Megan P, DO  diclofenac Sodium (VOLTAREN) 1 % GEL Apply 4 g topically 4 (four) times daily. 04/21/22   Johnson, Megan P, DO  gabapentin (NEURONTIN) 400 MG capsule Take 1 capsule (400 mg total) by mouth 3 (three) times daily. For nerve pain and itching 05/20/22   Johnson, Megan P, DO  GNP VITAMIN C 500 MG tablet TAKE 1 TABLET BY MOUTH ONCE DAILY TO KEEP YOUR IMMUNE SYSTEM UP 04/17/22   Park Liter P, DO  hydrALAZINE (APRESOLINE) 100 MG tablet Take 1 tablet (100 mg total) by mouth 3 (three) times daily. for blood pressure 04/30/22    Johnson, Megan P, DO  losartan (COZAAR) 100 MG tablet Take 1 tablet (100 mg total) by mouth daily. For blood pressure 04/09/22   Johnson, Megan P, DO  metFORMIN (GLUCOPHAGE) 500 MG tablet Take 1 tablet (500 mg total) by mouth 2 (two) times daily with a meal. 06/02/22   Johnson, Megan P, DO  Multiple Vitamin (MULTIVITAMIN WITH MINERALS) TABS tablet Take 1 tablet by mouth daily. For nutrition 02/23/22   Park Liter P, DO  nystatin (MYCOSTATIN/NYSTOP) powder Apply 1 Application topically 3 (three) times daily. 04/03/22   Johnson, Megan P, DO  nystatin ointment (MYCOSTATIN) Apply 1 Application topically 2 (two) times daily. 05/14/22   Johnson, Megan P, DO  Olopatadine HCl 0.2 % SOLN Apply 1 drop to eye daily. 04/09/22   Johnson, Megan P, DO  omeprazole (PRILOSEC) 40 MG capsule Take 1 capsule (40 mg total) by mouth daily. For heartburn 04/09/22   Johnson, Megan P, DO  polyethylene glycol powder (GLYCOLAX/MIRALAX) 17 GM/SCOOP powder Take 17 g by mouth 2 (two) times daily as needed. 03/02/22   Johnson, Megan P, DO  rosuvastatin (CRESTOR) 10 MG tablet Take 1 tablet (10 mg total) by mouth at bedtime. For cholesterol 04/09/22   Johnson, Megan P, DO  Semaglutide (RYBELSUS) 14 MG TABS Take 1 tablet (14 mg total) by mouth daily. 06/02/22   Johnson, Megan P, DO  sodium hypochlorite (DAKIN'S 1/2 STRENGTH) external solution Apply 1 Application topically daily at 12 noon. Topically to wound daily to clean. 02/13/22   [provider]  Torsemide 40 MG TABS Take by mouth. 02/26/22 02/26/23  [provider]  umeclidinium bromide (INCRUSE ELLIPTA) 62.5 MCG/ACT AEPB Inhale 1 puff into the lungs daily. For your breathing 02/18/22   Marnee Guarneri T, NP  Zinc Oxide 10 % OINT Apply topically.    [provider]    Physical Exam: Vitals:   06/13/22 1507 06/13/22 1525 06/13/22 1600 06/13/22 1659  BP:  116/65 110/69 120/63  Pulse: (!) 101 (!) 101 95 81  Resp:  20 20 18   Temp:    98 F (36.7 C)   TempSrc:    Oral  SpO2: 97% 98% 98% 100%   General: Not in acute distress HEENT:       Eyes: PERRL, EOMI, no scleral icterus.       ENT: No discharge from the ears and nose, no pharynx injection, no tonsillar enlargement.        Neck: No JVD, no bruit, no  mass felt. Heme: No neck lymph node enlargement. Cardiac: S1/S2, RRR, No murmurs, No gallops or rubs. Respiratory: No rales, wheezing, rhonchi or rubs. GI: Soft, nondistended, nontender, no rebound pain, no organomegaly, BS present. GU: has hematuria Ext: No pitting leg edema bilaterally. 1+DP/PT pulse bilaterally. Musculoskeletal: No joint deformities, No joint redness or warmth, no limitation of ROM in spin. Skin: No rashes.  Neuro: Alert, oriented X3, cranial nerves II-XII grossly intact, moves all extremities normally.  Psych: Patient is not psychotic, no suicidal or hemocidal ideation.  Labs on Admission: I have personally reviewed following labs and imaging studies  CBC: Recent Labs  Lab 06/13/22 1454  WBC 11.5*  NEUTROABS 9.1*  HGB 11.3*  HCT 35.7*  MCV 83.4  PLT 123456   Basic Metabolic Panel: Recent Labs  Lab 06/13/22 1454  NA 132*  K 3.9  CL 96*  CO2 25  GLUCOSE 499*  BUN 29*  CREATININE 1.43*  CALCIUM 10.0   GFR: Estimated Creatinine Clearance: 50.1 mL/min (A) (by C-G formula based on SCr of 1.43 mg/dL (H)). Liver Function Tests: Recent Labs  Lab 06/13/22 1454  AST 14*  ALT 11  ALKPHOS 80  BILITOT 0.6  PROT 8.0  ALBUMIN 3.3*   No results for input(s): "LIPASE", "AMYLASE" in the last 168 hours. No results for input(s): "AMMONIA" in the last 168 hours. Coagulation Profile: Recent Labs  Lab 06/13/22 1752  INR 1.2   Cardiac Enzymes: No results for input(s): "CKTOTAL", "CKMB", "CKMBINDEX", "TROPONINI" in the last 168 hours. BNP (last 3 results) No results for input(s): "PROBNP" in the last 8760 hours. HbA1C: No results for input(s): "HGBA1C" in the last 72 hours. CBG: Recent Labs  Lab  06/13/22 1748  GLUCAP 435*   Lipid Profile: No results for input(s): "CHOL", "HDL", "LDLCALC", "TRIG", "CHOLHDL", "LDLDIRECT" in the last 72 hours. Thyroid Function Tests: No results for input(s): "TSH", "T4TOTAL", "FREET4", "T3FREE", "THYROIDAB" in the last 72 hours. Anemia Panel: No results for input(s): "VITAMINB12", "FOLATE", "FERRITIN", "TIBC", "IRON", "RETICCTPCT" in the last 72 hours. Urine analysis:    Component Value Date/Time   COLORURINE YELLOW (A) 06/13/2022 1454   APPEARANCEUR HAZY (A) 06/13/2022 1454   APPEARANCEUR Cloudy (A) 06/02/2022 1525   LABSPEC 1.009 06/13/2022 1454   PHURINE 6.0 06/13/2022 1454   GLUCOSEU >=500 (A) 06/13/2022 1454   HGBUR LARGE (A) 06/13/2022 1454   BILIRUBINUR NEGATIVE 06/13/2022 1454   BILIRUBINUR Negative 06/02/2022 1525   KETONESUR NEGATIVE 06/13/2022 1454   PROTEINUR 100 (A) 06/13/2022 1454   NITRITE NEGATIVE 06/13/2022 1454   LEUKOCYTESUR NEGATIVE 06/13/2022 1454   Sepsis Labs: @LABRCNTIP (procalcitonin:4,lacticidven:4) )No results found for this or any previous visit (from the past 240 hour(s)).   Radiological Exams on Admission: CT Renal Stone Study  Result Date: 06/13/2022 CLINICAL DATA:  Abdominal and flank pain suspected kidney stone, hematuria, has catheter EXAM: CT ABDOMEN AND PELVIS WITHOUT CONTRAST TECHNIQUE: Multidetector CT imaging of the abdomen and pelvis was performed following the standard protocol without IV contrast. RADIATION DOSE REDUCTION: This exam was performed according to the departmental dose-optimization program which includes automated exposure control, adjustment of the mA and/or kV according to patient size and/or use of iterative reconstruction technique. COMPARISON:  01/30/2022 FINDINGS: Lower chest: Mild dependent atelectasis at lung bases. Hepatobiliary: Gallbladder surgically absent. Liver normal appearance. No biliary dilatation. Pancreas: Normal appearance Spleen: Normal appearance Adrenals/Urinary  Tract: Adrenal glands normal appearance. BILATERAL perinephric stranding increased from previous exam. No hydronephrosis or hydroureter. Bladder is decompressed, contains minimal urine and  air. Mild surrounding perivesicular infiltrative changes. Foley catheter balloon inflated within prostate gland. Stomach/Bowel: Scattered stool throughout colon. Sigmoid anastomosis. Bowel loops otherwise unremarkable. Stomach normal appearance. Appendix not visualized. Vascular/Lymphatic: Atherosclerotic calcifications aorta and iliac arteries without aneurysm. Additional vascular calcifications at coronary arteries and visceral arteries. No adenopathy. Reproductive: Enlarged prostate gland unchanged. Other: No free air or free fluid.  No hernia. Musculoskeletal: Osseous structures unremarkable. IMPRESSION: Foley catheter balloon inflated within prostate gland; recommend repositioning into bladder. Decompressed bladder with perivesicular stranding, as well as BILATERAL perinephric stranding, urinary tract infection/cystitis not excluded, recommend correlation with urinalysis. Enlarged prostate gland unchanged. No acute intra-abdominal or intrapelvic abnormalities. Aortic Atherosclerosis (ICD10-I70.0). Electronically Signed   By: Lavonia Dana M.D.   On: 06/13/2022 15:34      Assessment/Plan Principal Problem:   Urinary retention Active Problems:   BPH with obstruction/lower urinary tract symptoms Foley dependent   Gross hematuria   UTI (urinary tract infection)   CAD (coronary artery disease)   Chronic obstructive pulmonary disease (COPD) (HCC)   Chronic diastolic CHF (congestive heart failure) (HCC)   Pulmonary embolism (HCC)   History of pulmonary embolism 09/18/21   HTN (hypertension)   HLD (hyperlipidemia)   Stage 3b chronic kidney disease (CKD) (HCC)   Type II diabetes mellitus with renal manifestations (HCC)   History of CVA (cerebrovascular accident)   Sleep apnea   Obesity (BMI 30-39.9)   Assessment  and Plan:  Urinary retention due to gross hematuria and BPH with obstruction/lower urinary tract symptoms Foley dependent: consulted Dr. Jeffie Pollock of urology. Pt was found to have malpositioned foley catheter.  His foley was advanced. Dr. Jeffie Pollock recommended to start CBI. Hgb stable 11.3 ( 9.1 on 05/07/22).  -will place in med-surg bed for obs -CBI -pt will need to f/u with [facility] of urology for the voiding trial.   -will hold Eliquis now   UTI (urinary tract infection): Patient also has a possible UTI and with hazy appearance, WBC 21-50 -Started Rocephin in ED, will continue -Follow-up urine culture  Essential hypertension - IV hydralazine as needed - clonidine, hydralazine, Cozaar    CAD (coronary artery disease): S/p of CABG.  No chest pain.  -Continue Crestor   Chronic obstructive pulmonary disease (COPD) ([place]): Stable.  Oxygen saturation 97% on room air. -Bronchodilators   History of pulmonary embolism 09/18/21 -will hold Eliquis now -consulted Dr. Lowella Grip of [facility] for possible IVC filter placement. Per Dr. Lowella Grip, pt had embolization by IR last month.  Would like to wait to see urologist's plan, patient probably does not need IVC filter placement, at least not acutely.   Type II diabetes mellitus with renal manifestations [facility]): Recent A1c 8.1, poorly controlled.  Patient is taking semaglutide and metformin.  Blood sugar 499, anion gap 11.  No DKA -Glargine insulin 7 units daily -Sliding scale insulin   HLD (hyperlipidemia) - Crestor    Chronic diastolic CHF (congestive heart failure) ([place]): 2D echo on 09/20/2021 showed EF of 60 to 65%.  Patient does not have leg edema JVD.  CHF is compensated. -Continue home torsemide   Stage 3b chronic kidney disease (CKD) ([place]): stable -Monitor renal function by BMP   History of CVA (cerebrovascular accident) -  Crestor  Obesity with body mass index (BMI) of 30.0 to 39.9: Body weight 113.1 kg, BMI 35.77  -Diet and exercise.   -Encouraged to  lose weight.       DVT ppx: SCD  Code Status: Full code  Family Communication: not done, no  family member is at bed side.       Disposition Plan:  Anticipate discharge back to previous environment  Consults called:  Dr. Jeffie Pollock of urology and Dr. Lowella Grip of [facility]  Admission status and Level of care: Telemetry Medical:  for obs    Dispo: The patient is from: Home              Anticipated d/c is to: Home              Anticipated d/c date is: 1 day              Patient currently is not medically stable to d/c.    Severity of Illness:  The appropriate patient status for this patient is INPATIENT. Inpatient status is judged to be reasonable and necessary in order to provide the required intensity of service to ensure the patient's safety. The patient's presenting symptoms, physical exam findings, and initial radiographic and laboratory data in the context of their chronic comorbidities is felt to place them at high risk for further clinical deterioration. Furthermore, it is not anticipated that the patient will be medically stable for discharge from the hospital within 2 midnights of admission.   * I certify that at the point of admission it is my clinical judgment that the patient will require inpatient hospital care spanning beyond 2 midnights from the point of admission due to high intensity of service, high risk for further deterioration and high frequency of surveillance required.*       Date of Service 06/13/2022    Ivor Costa [facility]   If 7PM-7AM, please contact night-coverage [email or web address] 06/13/2022, 6:46 PM

## 2022-06-13 NOTE — ED Provider Notes (Signed)
   [facility] Provider Note    Event Date/Time   First MD Initiated Contact with Patient 06/13/22 1106     (approximate)   History   Urinary Retention (Foley clogged)   HPI  Justin Griffith is a 82 y.o. male with history of type 2 diabetes, COPD, chronic indwelling Foley catheter secondary to BPH, chronic kidney disease who presents with complaints of Foley catheter not draining properly.  He complains of significant severe pain in his lower abdomen.     Physical Exam   Triage Vital Signs: ED Triage Vitals  Enc Vitals Group     BP --      Pulse Rate 06/13/22 1104 (!) 101     Resp 06/13/22 1104 20     Temp 06/13/22 1104 98 F (36.7 C)     Temp Source 06/13/22 1104 Oral     SpO2 06/13/22 1104 96 %     Weight --      Height --      Head Circumference --      Peak Flow --      Pain Score 06/13/22 1103 10     Pain Loc --      Pain Edu? --      Excl. in [place]? --     Most recent vital signs: Vitals:   06/13/22 1104 06/13/22 1140  BP:  134/86  Pulse: (!) 101 (!) 110  Resp: 20 16  Temp: 98 F (36.7 C)   SpO2: 96% 95%     General: Awake, no distress.  CV:  Good peripheral perfusion.  Resp:  Normal effort.  Abd:  Mild suprapubic distention Other:  Foley catheter with small amount of yellow urine   ED Results / Procedures / Treatments   Labs (all labs ordered are listed, but only abnormal results are displayed) Labs Reviewed - No data to display   EKG     RADIOLOGY    PROCEDURES:  Critical Care performed:   Procedures   MEDICATIONS ORDERED IN ED: Medications - No data to display   IMPRESSION / MDM / [place] / ED COURSE  I reviewed the triage vital signs and the nursing notes. Patient's presentation is most consistent with exacerbation of chronic illness.  Patient with chronic indwelling Foley catheter presents with suprapubic discomfort, problems with catheter draining.  Suspect obstruction/partial  obstruction.  Have asked nurse to remove catheter and replace  Patient had immediate relief after catheter replacement, he has urology follow-up in 2 days      FINAL CLINICAL IMPRESSION(S) / ED DIAGNOSES   Final diagnoses:  Obstruction of Foley catheter, initial encounter [facility])     Rx / DC Orders   ED Discharge Orders     None        Note:  This document was prepared using Dragon voice recognition software and may include unintentional dictation errors.   Lavonia Drafts, MD 06/13/22 [phone]

## 2022-06-13 NOTE — ED Notes (Signed)
No return urine when foley placed. Pt coughed a couple min after placement and urine noted in foley tube. Bloody at first but clearing up as bladder emptied. Urine bag emptied, pink urine currently filling bag.

## 2022-06-13 NOTE — ED Provider Notes (Signed)
I received signout on this patient.  Perinephric stranding and inflammatory cells in the urine suggestive of urinary tract infection, mild tachycardia and white blood cell count elevation.  Twice this Foley has clogged and repeat visit to the emergency department twice today already.  Will reposition the Foley catheter since it is malpositioned on CT scan.  IV Rocephin ordered.  Admission.   Lucillie Garfinkel, MD 06/13/22 [phone]

## 2022-06-13 NOTE — ED Triage Notes (Signed)
Pt is having catheter issues, was just discharged from here earlier. Pt states he is unable to urinate and it is burning.

## 2022-06-13 NOTE — ED Notes (Addendum)
Foley removed, pt then passed three blood clots each about the size of a nickle. Pt also oozing blood from his penis.

## 2022-06-14 DIAGNOSIS — I2510 Atherosclerotic heart disease of native coronary artery without angina pectoris: Secondary | ICD-10-CM | POA: Diagnosis present

## 2022-06-14 DIAGNOSIS — N1832 Chronic kidney disease, stage 3b: Secondary | ICD-10-CM | POA: Diagnosis present

## 2022-06-14 DIAGNOSIS — L89151 Pressure ulcer of sacral region, stage 1: Secondary | ICD-10-CM | POA: Diagnosis present

## 2022-06-14 DIAGNOSIS — E871 Hypo-osmolality and hyponatremia: Secondary | ICD-10-CM | POA: Diagnosis present

## 2022-06-14 DIAGNOSIS — T83021A Displacement of indwelling urethral catheter, initial encounter: Secondary | ICD-10-CM | POA: Diagnosis present

## 2022-06-14 DIAGNOSIS — N178 Other acute kidney failure: Secondary | ICD-10-CM | POA: Diagnosis not present

## 2022-06-14 DIAGNOSIS — I130 Hypertensive heart and chronic kidney disease with heart failure and stage 1 through stage 4 chronic kidney disease, or unspecified chronic kidney disease: Secondary | ICD-10-CM | POA: Diagnosis present

## 2022-06-14 DIAGNOSIS — F05 Delirium due to known physiological condition: Secondary | ICD-10-CM | POA: Diagnosis present

## 2022-06-14 DIAGNOSIS — E785 Hyperlipidemia, unspecified: Secondary | ICD-10-CM | POA: Diagnosis present

## 2022-06-14 DIAGNOSIS — E1165 Type 2 diabetes mellitus with hyperglycemia: Secondary | ICD-10-CM | POA: Diagnosis present

## 2022-06-14 DIAGNOSIS — F0392 Unspecified dementia, unspecified severity, with psychotic disturbance: Secondary | ICD-10-CM | POA: Diagnosis present

## 2022-06-14 DIAGNOSIS — Z86711 Personal history of pulmonary embolism: Secondary | ICD-10-CM | POA: Diagnosis not present

## 2022-06-14 DIAGNOSIS — R339 Retention of urine, unspecified: Secondary | ICD-10-CM | POA: Diagnosis present

## 2022-06-14 DIAGNOSIS — D519 Vitamin B12 deficiency anemia, unspecified: Secondary | ICD-10-CM | POA: Diagnosis present

## 2022-06-14 DIAGNOSIS — R310 Gross hematuria: Secondary | ICD-10-CM | POA: Diagnosis present

## 2022-06-14 DIAGNOSIS — F32A Depression, unspecified: Secondary | ICD-10-CM | POA: Diagnosis present

## 2022-06-14 DIAGNOSIS — N138 Other obstructive and reflux uropathy: Secondary | ICD-10-CM | POA: Diagnosis not present

## 2022-06-14 DIAGNOSIS — M1990 Unspecified osteoarthritis, unspecified site: Secondary | ICD-10-CM | POA: Diagnosis present

## 2022-06-14 DIAGNOSIS — E669 Obesity, unspecified: Secondary | ICD-10-CM | POA: Diagnosis present

## 2022-06-14 DIAGNOSIS — L8990 Pressure ulcer of unspecified site, unspecified stage: Secondary | ICD-10-CM | POA: Insufficient documentation

## 2022-06-14 DIAGNOSIS — E1122 Type 2 diabetes mellitus with diabetic chronic kidney disease: Secondary | ICD-10-CM | POA: Diagnosis present

## 2022-06-14 DIAGNOSIS — Y738 Miscellaneous gastroenterology and urology devices associated with adverse incidents, not elsewhere classified: Secondary | ICD-10-CM | POA: Diagnosis present

## 2022-06-14 DIAGNOSIS — N3289 Other specified disorders of bladder: Secondary | ICD-10-CM | POA: Diagnosis not present

## 2022-06-14 DIAGNOSIS — N401 Enlarged prostate with lower urinary tract symptoms: Secondary | ICD-10-CM | POA: Diagnosis not present

## 2022-06-14 DIAGNOSIS — J4489 Other specified chronic obstructive pulmonary disease: Secondary | ICD-10-CM | POA: Diagnosis present

## 2022-06-14 DIAGNOSIS — J439 Emphysema, unspecified: Secondary | ICD-10-CM | POA: Diagnosis present

## 2022-06-14 DIAGNOSIS — I5032 Chronic diastolic (congestive) heart failure: Secondary | ICD-10-CM | POA: Diagnosis present

## 2022-06-14 DIAGNOSIS — N390 Urinary tract infection, site not specified: Secondary | ICD-10-CM | POA: Diagnosis present

## 2022-06-14 DIAGNOSIS — Z794 Long term (current) use of insulin: Secondary | ICD-10-CM | POA: Diagnosis not present

## 2022-06-14 DIAGNOSIS — D6832 Hemorrhagic disorder due to extrinsic circulating anticoagulants: Secondary | ICD-10-CM | POA: Diagnosis present

## 2022-06-14 DIAGNOSIS — R319 Hematuria, unspecified: Secondary | ICD-10-CM | POA: Diagnosis not present

## 2022-06-14 DIAGNOSIS — N3001 Acute cystitis with hematuria: Secondary | ICD-10-CM | POA: Diagnosis not present

## 2022-06-14 DIAGNOSIS — G4733 Obstructive sleep apnea (adult) (pediatric): Secondary | ICD-10-CM | POA: Diagnosis present

## 2022-06-14 DIAGNOSIS — N179 Acute kidney failure, unspecified: Secondary | ICD-10-CM | POA: Diagnosis present

## 2022-06-14 DIAGNOSIS — T839XXA Unspecified complication of genitourinary prosthetic device, implant and graft, initial encounter: Secondary | ICD-10-CM | POA: Diagnosis not present

## 2022-06-14 LAB — URINE CULTURE: Culture: NO GROWTH

## 2022-06-14 LAB — CBC
HCT: 29.4 % — ABNORMAL LOW (ref 39.0–52.0)
Hemoglobin: 9.1 g/dL — ABNORMAL LOW (ref 13.0–17.0)
MCH: 25.9 pg — ABNORMAL LOW (ref 26.0–34.0)
MCHC: 31 g/dL (ref 30.0–36.0)
MCV: 83.8 fL (ref 80.0–100.0)
Platelets: 276 10*3/uL (ref 150–400)
RBC: 3.51 MIL/uL — ABNORMAL LOW (ref 4.22–5.81)
RDW: 14.3 % (ref 11.5–15.5)
WBC: 7.5 10*3/uL (ref 4.0–10.5)
nRBC: 0 % (ref 0.0–0.2)

## 2022-06-14 LAB — BASIC METABOLIC PANEL
Anion gap: 5 (ref 5–15)
BUN: 32 mg/dL — ABNORMAL HIGH (ref 8–23)
CO2: 26 mmol/L (ref 22–32)
Calcium: 9.2 mg/dL (ref 8.9–10.3)
Chloride: 101 mmol/L (ref 98–111)
Creatinine, Ser: 1.66 mg/dL — ABNORMAL HIGH (ref 0.61–1.24)
GFR, Estimated: 41 mL/min — ABNORMAL LOW (ref >60)
Glucose, Bld: 293 mg/dL — ABNORMAL HIGH (ref 70–99)
Potassium: 3.8 mmol/L (ref 3.5–5.1)
Sodium: 132 mmol/L — ABNORMAL LOW (ref 135–145)

## 2022-06-14 LAB — GLUCOSE, CAPILLARY
Glucose-Capillary: 309 mg/dL — ABNORMAL HIGH (ref 70–99)
Glucose-Capillary: 331 mg/dL — ABNORMAL HIGH (ref 70–99)
Glucose-Capillary: 262 mg/dL — ABNORMAL HIGH (ref 70–99)
Glucose-Capillary: 349 mg/dL — ABNORMAL HIGH (ref 70–99)

## 2022-06-14 MED ORDER — CHLORHEXIDINE GLUCONATE CLOTH 2 % EX PADS
6.0000 | MEDICATED_PAD | Freq: Every day | CUTANEOUS | Status: DC
  Administered 2022-06-14 – 2022-06-20 (×7): 6 via TOPICAL

## 2022-06-14 MED ORDER — INSULIN GLARGINE-YFGN 100 UNIT/ML ~~LOC~~ SOLN: 16.0000 [IU] | Freq: Every day | SUBCUTANEOUS | Status: DC

## 2022-06-14 MED ORDER — INSULIN GLARGINE-YFGN 100 UNIT/ML ~~LOC~~ SOLN
16.0000 [IU] | Freq: Every day | SUBCUTANEOUS | Status: DC
  Administered 2022-06-14: 16 [IU] via SUBCUTANEOUS
  Filled 2022-06-14: qty 0.16

## 2022-06-14 NOTE — Progress Notes (Signed)
Subjective: Justin Griffith is doing well with clear urine on no CBI.  He has no complaints.  His Hgb is down to 9.1 from 11.3 but there is no bleeding now.  ROS:  Review of Systems  All other systems reviewed and are negative.   Anti-infectives: Anti-infectives (From admission, onward)    Start     Dose/Rate Route Frequency Ordered Stop   06/14/22 1600  cefTRIAXone (ROCEPHIN) 2 g in sodium chloride 0.9 % 100 mL IVPB        2 g 200 mL/hr over 30 Minutes Intravenous Every 24 hours 06/13/22 1621     06/13/22 1630  cefTRIAXone (ROCEPHIN) 1 g in sodium chloride 0.9 % 100 mL IVPB  Status:  Discontinued        1 g 200 mL/hr over 30 Minutes Intravenous Every 24 hours 06/13/22 1620 06/13/22 1621   06/13/22 1630  cefTRIAXone (ROCEPHIN) 1 g in sodium chloride 0.9 % 100 mL IVPB        1 g 200 mL/hr over 30 Minutes Intravenous  Once 06/13/22 1621 06/13/22 1701   06/13/22 1545  cefTRIAXone (ROCEPHIN) 1 g in sodium chloride 0.9 % 100 mL IVPB        1 g 200 mL/hr over 30 Minutes Intravenous  Once 06/13/22 1543 06/13/22 1615       Current Facility-Administered Medications  Medication Dose Route Frequency Provider Last Rate Last Admin   acetaminophen (TYLENOL) tablet 650 mg  650 mg Oral Q6H PRN Ivor Costa, MD       albuterol (PROVENTIL) (2.5 MG/3ML) 0.083% nebulizer solution 3 mL  3 mL Inhalation Q4H PRN Ivor Costa, MD       cefTRIAXone (ROCEPHIN) 2 g in sodium chloride 0.9 % 100 mL IVPB  2 g Intravenous Q24H Ivor Costa, MD       cloNIDine (CATAPRES) tablet 0.1 mg  0.1 mg Oral BID Ivor Costa, MD       dextromethorphan-guaiFENesin ([place] DM) 30-600 MG per 12 hr tablet 1 tablet  1 tablet Oral BID PRN Ivor Costa, MD       gabapentin (NEURONTIN) capsule 400 mg  400 mg Oral TID Ivor Costa, MD       hydrALAZINE (APRESOLINE) injection 5 mg  5 mg Intravenous Q2H PRN Ivor Costa, MD       hydrALAZINE (APRESOLINE) tablet 100 mg  100 mg Oral TID Ivor Costa, MD   100 mg at 06/13/22 2156   insulin aspart  (novoLOG) injection 0-5 Units  0-5 Units Subcutaneous QHS Ivor Costa, MD   4 Units at 06/13/22 2156   insulin aspart (novoLOG) injection 0-9 Units  0-9 Units Subcutaneous TID WC Ivor Costa, MD       insulin glargine-yfgn [facility]) injection 10 Units  10 Units Subcutaneous Daily Ivor Costa, MD   10 Units at 06/13/22 1826   losartan (COZAAR) tablet 100 mg  100 mg Oral Daily Ivor Costa, MD       morphine (PF) 2 MG/ML injection 1 mg  1 mg Intravenous Q4H PRN Ivor Costa, MD       multivitamin with minerals tablet 1 tablet  1 tablet Oral Daily Ivor Costa, MD       nicotine (NICODERM CQ - dosed in mg/24 hours) patch 21 mg  21 mg Transdermal Daily Ivor Costa, MD       nystatin (MYCOSTATIN/NYSTOP) topical powder   Topical BID Ivor Costa, MD   Given at 06/13/22 2156   olopatadine (PATANOL) 0.1 % ophthalmic solution  1 drop  1 drop Both Eyes BID PRN Renda Rolls, RPH       ondansetron (ZOFRAN) injection 4 mg  4 mg Intravenous Q8H PRN Ivor Costa, MD       pantoprazole (PROTONIX) EC tablet 40 mg  40 mg Oral Daily Ivor Costa, MD       polyethylene glycol powder (GLYCOLAX/MIRALAX) container 17 g  17 g Oral BID PRN Ivor Costa, MD       rosuvastatin (CRESTOR) tablet 10 mg  10 mg Oral QHS Ivor Costa, MD   10 mg at 06/13/22 2156   sodium chloride irrigation 0.9 % 3,000 mL  3,000 mL Irrigation Continuous Ivor Costa, MD   3,000 mL at 06/13/22 1857   torsemide (DEMADEX) tablet 40 mg  40 mg Oral Q1400 Ivor Costa, MD       umeclidinium bromide (INCRUSE ELLIPTA) 62.5 MCG/ACT 1 puff  1 puff Inhalation Daily Ivor Costa, MD         Objective: Vital signs in last 24 hours: Temp:  [98 F (36.7 C)-98.5 F (36.9 C)] 98.5 F (36.9 C) (03/24 0749) Pulse Rate:  [68-133] 68 (03/24 0749) Resp:  [14-20] 19 (03/24 0749) BP: (105-200)/(59-114) 116/67 (03/24 0749) SpO2:  [93 %-100 %] 97 % (03/24 0749) Weight:  [108.5 kg] 108.5 kg (03/23 2145)  Intake/Output from previous day: 03/23 0701 - 03/24 0700 In: 150 [IV  Piggyback:150] Out: 2000 [Urine:2000] Intake/Output this shift: No intake/output data recorded.   Physical Exam Vitals reviewed.  Constitutional:      Appearance: Normal appearance. He is obese.  Genitourinary:    Comments: Urine clear with CBI off.  Neurological:     Mental Status: He is alert.     Lab Results:  Recent Labs    06/13/22 1454 06/14/22 0415  WBC 11.5* 7.5  HGB 11.3* 9.1*  HCT 35.7* 29.4*  PLT 335 276   BMET Recent Labs    06/13/22 1454 06/14/22 0415  NA 132* 132*  K 3.9 3.8  CL 96* 101  CO2 25 26  GLUCOSE 499* 293*  BUN 29* 32*  CREATININE 1.43* 1.66*  CALCIUM 10.0 9.2   PT/INR Recent Labs    06/13/22 1752  LABPROT 15.5*  INR 1.2   ABG No results for input(s): "PHART", "HCO3" in the last 72 hours.  Invalid input(s): "PCO2", "PO2"  Studies/Results: CT Renal Stone Study  Result Date: 06/13/2022 CLINICAL DATA:  Abdominal and flank pain suspected kidney stone, hematuria, has catheter EXAM: CT ABDOMEN AND PELVIS WITHOUT CONTRAST TECHNIQUE: Multidetector CT imaging of the abdomen and pelvis was performed following the standard protocol without IV contrast. RADIATION DOSE REDUCTION: This exam was performed according to the departmental dose-optimization program which includes automated exposure control, adjustment of the mA and/or kV according to patient size and/or use of iterative reconstruction technique. COMPARISON:  01/30/2022 FINDINGS: Lower chest: Mild dependent atelectasis at lung bases. Hepatobiliary: Gallbladder surgically absent. Liver normal appearance. No biliary dilatation. Pancreas: Normal appearance Spleen: Normal appearance Adrenals/Urinary Tract: Adrenal glands normal appearance. BILATERAL perinephric stranding increased from previous exam. No hydronephrosis or hydroureter. Bladder is decompressed, contains minimal urine and air. Mild surrounding perivesicular infiltrative changes. Foley catheter balloon inflated within prostate gland.  Stomach/Bowel: Scattered stool throughout colon. Sigmoid anastomosis. Bowel loops otherwise unremarkable. Stomach normal appearance. Appendix not visualized. Vascular/Lymphatic: Atherosclerotic calcifications aorta and iliac arteries without aneurysm. Additional vascular calcifications at coronary arteries and visceral arteries. No adenopathy. Reproductive: Enlarged prostate gland unchanged. Other: No free air or free  fluid.  No hernia. Musculoskeletal: Osseous structures unremarkable. IMPRESSION: Foley catheter balloon inflated within prostate gland; recommend repositioning into bladder. Decompressed bladder with perivesicular stranding, as well as BILATERAL perinephric stranding, urinary tract infection/cystitis not excluded, recommend correlation with urinalysis. Enlarged prostate gland unchanged. No acute intra-abdominal or intrapelvic abnormalities. Aortic Atherosclerosis (ICD10-I70.0). Electronically Signed   By: Lavonia Dana M.D.   On: 06/13/2022 15:34     Assessment and Plan: BPH with retention and hematuria from a malpositioned foley balloon.  HIs urine is clear and the foley is draining well.   He could go home and f/u with Dr. Diamantina Providence tomorrow for a voiding trial as planned. .       LOS: 0 days    Irine Seal 3/24/2024Patient ID: Justin Griffith, male   DOB: Oct 25, 1940, 82 y.o.   MRN: [id number]

## 2022-06-14 NOTE — Hospital Course (Addendum)
Justin Griffith is a 82 y.o. male with medical history significant of hypertension, hyperlipidemia, diabetes mellitus, COPD/asthma, stroke, depression, OSA, colon cancer, BPH with urinary retention with chronic indwelling catheter placement, PE on Eliquis, obesity, CAD, CABG, CKD-3B, dCHF, PAD, chronic indwelling Foley cath, who presents to ED due to Foley cath not draining.  He has some gross hematuria and Foley catheter was advanced.  Patient has been seen by urology, apparently patient had recurrent obstruction with Foley catheter with blood clots.  Anticoagulation was discontinued.  Patient is also seen by vascular surgery.  Not planning for IVC filter.  Patient has completed 6 months of anticoagulation for his a first episode of PE. Anticoagulation is on hold.  Hematuria is improving.  Patient was also started on treated with Rocephin, urine culture came back without growth, urology still want antibiotics due to obstruction.  Switch to Keflex at time of discharge.

## 2022-06-14 NOTE — Progress Notes (Signed)
Progress Note   Patient: Justin Griffith [id number] DOB: Dec 06, 1940 DOA: 06/13/2022     0 DOS: the patient was seen and examined on 06/14/2022   Brief hospital course: Justin Griffith is a 82 y.o. male with medical history significant of hypertension, hyperlipidemia, diabetes mellitus, COPD/asthma, stroke, depression, OSA, colon cancer, BPH with urinary retention with chronic indwelling catheter placement, PE on Eliquis, obesity, CAD, CABG, CKD-3B, dCHF, PAD, chronic indwelling Foley cath, who presents to ED due to Foley cath not draining.  He has some gross hematuria and Foley catheter was advanced.  Patient has been seen by urology, apparently patient had recurrent obstruction with Foley catheter with blood clots.  Anticoagulation was discontinued.  Patient is also seen by vascular surgery.  Not planning for IVC filter.  Patient has completed 6 months of anticoagulation for his a first episode of PE. Anticoagulation is on hold.  Hematuria is improving.  Patient was also started on Rocephin for possible UTI pending urine culture results.    Principal Problem:   Urinary retention Active Problems:   BPH with obstruction/lower urinary tract symptoms Foley dependent   Gross hematuria   UTI (urinary tract infection)   CAD (coronary artery disease)   Chronic obstructive pulmonary disease (COPD) (HCC)   Chronic diastolic CHF (congestive heart failure) (HCC)   Pulmonary embolism (HCC)   History of pulmonary embolism 09/18/21   HTN (hypertension)   HLD (hyperlipidemia)   Stage 3b chronic kidney disease (CKD) (HCC)   Type II diabetes mellitus with renal manifestations (HCC)   History of CVA (cerebrovascular accident)   Sleep apnea   Obesity (BMI 30-39.9)   Senile dementia with delirium ([place])   Pressure injury of skin   Assessment and Plan: Urinary retention due to gross hematuria and BPH with obstruction/lower urinary tract symptoms Foley dependent Possible urinary tract infection with  hematuria. Discussed with urology, will continue bladder irrigation.  Hematuria seems to be better. Urine culture still pending, continue Rocephin. Patient may be able to discharge home tomorrow and follow-up with urology as outpatient.    History of pulmonary embolism 09/18/21 Anticoagulation on hold due to active bleeding.  Per vascular surgery, patient was supposed to be taking Eliquis for 6 months, which has completed.  However, patient does have moderate to high risk for recurrent thrombosis.  Discussed with patient wife, will continue hold anticoagulation.  Dementia with delirium. Patient has history of dementia per wife, patient also had significant visual hallucination while in the hospital.  Continue to follow.  Essential hypertension Continue home medicine.   CAD (coronary artery disease):  -Continue Crestor   Chronic obstructive pulmonary disease (COPD) ([place]):  Stable.   Type II diabetes mellitus with renal manifestations [facility]): Recent A1c 8.1, poorly controlled.   Will increase long-acting insulin.   HLD (hyperlipidemia) - Crestor    Chronic diastolic CHF (congestive heart failure) ([place]): 2D echo on 09/20/2021 showed EF of 60 to 65%.   Patient euvolemic, no exacerbation.   Stage 3b chronic kidney disease (CKD) ([place]): stable Hyponatremia. Stable.   History of CVA (cerebrovascular accident) -  Crestor   Obesity with body mass index (BMI) of 30.0 to 39.9: Body weight 113.1 kg, BMI 35.77  -Diet and exercise.        Subjective:  Patient has some confusion, but no agitation.  Occasional visual hallucination.  Denies any short of breath or cough.  Physical Exam: Vitals:   06/13/22 2129 06/13/22 2145 06/13/22 2358 06/14/22 0749  BP: 112/68  Marland Kitchen)  112/59 116/67  Pulse: 71  73 68  Resp: 14  18 19   Temp: 98 F (36.7 C)  98.1 F (36.7 C) 98.5 F (36.9 C)  TempSrc: Oral     SpO2: 98%  98% 97%  Weight:  108.5 kg    Height:  5\' 10"  (1.778 m)     General exam:  Appears calm and comfortable, obese. Respiratory system: Clear to auscultation. Respiratory effort normal. Cardiovascular system: S1 & S2 heard, RRR. No JVD, murmurs, rubs, gallops or clicks. No pedal edema. Gastrointestinal system: Abdomen is nondistended, soft and nontender. No organomegaly or masses felt. Normal bowel sounds heard. Central nervous system: Alert and oriented x2. No focal neurological deficits. Extremities: Symmetric 5 x 5 power. Skin: No rashes, lesions or ulcers Psychiatry:  Mood & affect appropriate.    Data Reviewed:  Lab results reviewed, CT scan reviewed.  Family Communication: Wife updated over the phone.  Disposition: Status is: Inpatient Remains inpatient appropriate because: Severity of disease, IV treatment.     Time spent: 50 minutes  Author: Sharen Hones, MD 06/14/2022 1:18 PM  For on call review [email or web address].

## 2022-06-14 NOTE — Consult Note (Signed)
Reason for Consult: Consideration for IVC filter placement Referring Physician: Ivor Costa MD  Justin Griffith is an 82 y.o. male.  HPI: Patient is an 82 year old gentleman with a history of severe prostatic enlargement requiring chronic Foley catheterization.  He presents to the emergency department yesterday with urinary retention felt secondary to retained clots and underwent replacement of his Foley catheter with initial relief.  However he soon became obstructed again and Foley catheter was replaced.  CT scan was performed demonstrating some stranding around the bladder as well as the kidneys consistent with chronic inflammatory changes.  Foley balloon was noted to be within the prostatic urethra.  Foley was subsequently replaced for irrigation.  Because of his hematuria, we are asked regarding possible placement of an IVC filter.  On questioning regarding hematuria he reports only intermittent bleeding noted from his Foley catheter and this is not a daily occurrence.  He does report occasionally leaking urine around the catheter however.  Patient's history is complex with significant comorbidities as noted.  Last year in June he was scheduled to undergo GU intervention but this had to be postponed due to admission for pulmonary emboli.  At that time he has begun on Eliquis with a plan for 34-month course.  He denies prior history of deep vein thrombosis, superficial thrombophlebitis or thrombophilia.  He has had no other episodes of pulmonary thromboembolic complications.  Review of electronic medical record demonstrates no evidence of deep vein thrombosis by duplex examination extending back several years.  It remains unclear whether there were provoking events surrounding his pulmonary thromboembolic admission.  He does have significant comorbidities making mobility suboptimal and he has moderate obesity and a prior history of colon cancer as risk factors.  Past Medical History:  Diagnosis Date    Acute urinary retention    Arthritis    Asthma 2000   Back pain    Colon polyp 2012   COPD (chronic obstructive pulmonary disease) (HCC)    Emphysema of lung (HCC)    Heart murmur    Hernia 2000   History of cold sores 05/06/2020   History of colon cancer    Hyperlipidemia    Hypertension    Personal history of malignant neoplasm of large intestine    Personal history of tobacco use, presenting hazards to health    Septic shock ([place]) 11/18/2021   Sleep apnea    Special screening for malignant neoplasms, colon    Type 2 diabetes mellitus with proteinuria ([place]) 10/11/2014    Past Surgical History:  Procedure Laterality Date   CARDIAC CATHETERIZATION Left 09/25/2015   Procedure: Left Heart Cath and Coronary Angiography;  Surgeon: Yolonda Kida, MD;  Location: [facility];  Service: Cardiovascular;  Laterality: Left;   CHOLECYSTECTOMY  1970   COLON SURGERY  07-16-99   sigmoid colon resection with primary anastomosis, chemotherapy for metastatic disease   COLONOSCOPY  2001, 2012   Dr Bary Castilla, tubular adenoma of the cecum and ascending colon in 2012.   COLONOSCOPY WITH PROPOFOL N/A 02/12/2016   Procedure: COLONOSCOPY WITH PROPOFOL;  Surgeon: Robert Bellow, MD;  Location: [facility];  Service: Endoscopy;  Laterality: N/A;   CORONARY STENT INTERVENTION N/A 05/17/2020   Procedure: CORONARY STENT INTERVENTION;  Surgeon: Yolonda Kida, MD;  Location: [facility];  Service: Cardiovascular;  Laterality: N/A;  LAD   ESOPHAGOGASTRODUODENOSCOPY (EGD) WITH PROPOFOL N/A 02/12/2016   Procedure: ESOPHAGOGASTRODUODENOSCOPY (EGD) WITH PROPOFOL;  Surgeon: Robert Bellow, MD;  Location: [facility]  ENDOSCOPY;  Service: Endoscopy;  Laterality: N/A;   HERNIA REPAIR Right    right inguinal hernia repair   IR 3D INDEPENDENT WKST  05/07/2022   IR ANGIOGRAM PELVIS SELECTIVE OR SUPRASELECTIVE  05/07/2022   IR ANGIOGRAM PELVIS SELECTIVE OR SUPRASELECTIVE  05/07/2022   IR ANGIOGRAM  SELECTIVE EACH ADDITIONAL VESSEL  05/07/2022   IR EMBO ARTERIAL NOT HEMORR HEMANG INC GUIDE ROADMAPPING  05/07/2022   IR RADIOLOGIST EVAL & MGMT  02/27/2022   IR RADIOLOGIST EVAL & MGMT  03/25/2022   IR RADIOLOGIST EVAL & MGMT  06/01/2022   IR US GUIDE VASC ACCESS LEFT  05/07/2022   JOINT REPLACEMENT Bilateral    knee replacement   KNEE SURGERY Bilateral 2010   LEFT HEART CATH AND CORONARY ANGIOGRAPHY N/A 05/17/2020   Procedure: LEFT HEART CATH AND CORONARY ANGIOGRAPHY possible PCI and stent;  Surgeon: Yolonda Kida, MD;  Location: [facility];  Service: Cardiovascular;  Laterality: N/A;   MYRINGOTOMY WITH TUBE PLACEMENT Bilateral 08/16/2014   Procedure: MYRINGOTOMY WITH TUBE PLACEMENT;  Surgeon: Carloyn Manner, MD;  Location: [facility];  Service: ENT;  Laterality: Bilateral;   PERIPHERAL VASCULAR CATHETERIZATION Right 09/04/2015   Procedure: Lower Extremity Angiography;  Surgeon: Katha Cabal, MD;  Location: [facility];  Service: Cardiovascular;  Laterality: Right;   PERIPHERAL VASCULAR CATHETERIZATION  09/04/2015   Procedure: Lower Extremity Intervention;  Surgeon: Katha Cabal, MD;  Location: [facility];  Service: Cardiovascular;;   TONSILLECTOMY     TYMPANOSTOMY TUBE PLACEMENT      Family History  Problem Relation Age of Onset   Diabetes Father    Esophageal cancer Mother    Alzheimer's disease Paternal Uncle     Social History:  reports that he quit smoking about 33 years ago. His smoking use included cigarettes. He has a 30.00 pack-year smoking history. He has been exposed to tobacco smoke. He has never used smokeless tobacco. He reports that he does not drink alcohol and does not use drugs.  Allergies:  Allergies  Allergen Reactions   Farxiga [Dapagliflozin] Rash    Causes a severe rash in groin area    Dulaglutide Diarrhea and Other (See Comments)   Liraglutide Diarrhea and Other (See Comments)   Jardiance [Empagliflozin] Rash     Medications: I have reviewed the patient's current medications.  Results for orders placed or performed during the hospital encounter of 06/13/22 (from the past 48 hour(s))  CBC with Differential     Status: Abnormal   Collection Time: 06/13/22  2:54 PM  Result Value Ref Range   WBC 11.5 (H) 4.0 - 10.5 K/uL   RBC 4.28 4.22 - 5.81 MIL/uL   Hemoglobin 11.3 (L) 13.0 - 17.0 g/dL   HCT 35.7 (L) 39.0 - 52.0 %   MCV 83.4 80.0 - 100.0 fL   MCH 26.4 26.0 - 34.0 pg   MCHC 31.7 30.0 - 36.0 g/dL   RDW 14.3 11.5 - 15.5 %   Platelets 335 150 - 400 K/uL   nRBC 0.0 0.0 - 0.2 %   Neutrophils Relative % 78 %   Neutro Abs 9.1 (H) 1.7 - 7.7 K/uL   Lymphocytes Relative 13 %   Lymphs Abs 1.4 0.7 - 4.0 K/uL   Monocytes Relative 7 %   Monocytes Absolute 0.8 0.1 - 1.0 K/uL   Eosinophils Relative 1 %   Eosinophils Absolute 0.1 0.0 - 0.5 K/uL   Basophils Relative 0 %   Basophils Absolute  0.1 0.0 - 0.1 K/uL   Immature Granulocytes 1 %   Abs Immature Granulocytes 0.06 0.00 - 0.07 K/uL    Comment: Performed at [facility], [address] [place], [place] [address]  Comprehensive metabolic panel     Status: Abnormal   Collection Time: 06/13/22  2:54 PM  Result Value Ref Range   Sodium 132 (L) 135 - 145 mmol/L   Potassium 3.9 3.5 - 5.1 mmol/L   Chloride 96 (L) 98 - 111 mmol/L   CO2 25 22 - 32 mmol/L   Glucose, Bld 499 (H) 70 - 99 mg/dL    Comment: Glucose reference range applies only to samples taken after fasting for at least 8 hours.   BUN 29 (H) 8 - 23 mg/dL   Creatinine, Ser 1.43 (H) 0.61 - 1.24 mg/dL   Calcium 10.0 8.9 - 10.3 mg/dL   Total Protein 8.0 6.5 - 8.1 g/dL   Albumin 3.3 (L) 3.5 - 5.0 g/dL   AST 14 (L) 15 - 41 U/L   ALT 11 0 - 44 U/L   Alkaline Phosphatase 80 38 - 126 U/L   Total Bilirubin 0.6 0.3 - 1.2 mg/dL   GFR, Estimated 49 (L) >60 mL/min    Comment: (NOTE) Calculated using the CKD-EPI Creatinine Equation (2021)    Anion gap 11 5 - 15    Comment: Performed at  [facility], [address] [place], [place] [address]  Urinalysis, Routine w reflex microscopic -Urine, Clean Catch     Status: Abnormal   Collection Time: 06/13/22  2:54 PM  Result Value Ref Range   Color, Urine YELLOW (A) YELLOW   APPearance HAZY (A) CLEAR   Specific Gravity, Urine 1.009 1.005 - 1.030   pH 6.0 5.0 - 8.0   Glucose, UA >=500 (A) NEGATIVE mg/dL   Hgb urine dipstick LARGE (A) NEGATIVE   Bilirubin Urine NEGATIVE NEGATIVE   Ketones, ur NEGATIVE NEGATIVE mg/dL   Protein, ur 100 (A) NEGATIVE mg/dL   Nitrite NEGATIVE NEGATIVE   Leukocytes,Ua NEGATIVE NEGATIVE   RBC / HPF >50 0 - 5 RBC/hpf   WBC, UA 21-50 0 - 5 WBC/hpf   Bacteria, UA NONE SEEN NONE SEEN   Squamous Epithelial / HPF NONE SEEN 0 - 5 /HPF    Comment: Performed at [facility], [address] [place], [place] [address]  Brain natriuretic peptide     Status: None   Collection Time: 06/13/22  4:40 PM  Result Value Ref Range   B Natriuretic Peptide 67.1 0.0 - 100.0 pg/mL    Comment: Performed at [facility], [address] [place], [place] [address]  Glucose, capillary     Status: Abnormal   Collection Time: 06/13/22  5:48 PM  Result Value Ref Range   Glucose-Capillary 435 (H) 70 - 99 mg/dL    Comment: Glucose reference range applies only to samples taken after fasting for at least 8 hours.  Protime-INR     Status: Abnormal   Collection Time: 06/13/22  5:52 PM  Result Value Ref Range   Prothrombin Time 15.5 (H) 11.4 - 15.2 seconds   INR 1.2 0.8 - 1.2    Comment: (NOTE) INR goal varies based on device and disease states. Performed at [facility], [address] [place],  [address]   APTT     Status: None   Collection Time: 06/13/22  5:52 PM  Result Value Ref Range   aPTT 29 24 - 36  seconds    Comment: Performed at [facility], [address] [place], [place] [address]  Glucose, capillary     Status: Abnormal   Collection Time: 06/13/22   7:10 PM  Result Value Ref Range   Glucose-Capillary 384 (H) 70 - 99 mg/dL    Comment: Glucose reference range applies only to samples taken after fasting for at least 8 hours.  Glucose, capillary     Status: Abnormal   Collection Time: 06/13/22  9:40 PM  Result Value Ref Range   Glucose-Capillary 312 (H) 70 - 99 mg/dL    Comment: Glucose reference range applies only to samples taken after fasting for at least 8 hours.  Basic metabolic panel     Status: Abnormal   Collection Time: 06/14/22  4:15 AM  Result Value Ref Range   Sodium 132 (L) 135 - 145 mmol/L   Potassium 3.8 3.5 - 5.1 mmol/L   Chloride 101 98 - 111 mmol/L   CO2 26 22 - 32 mmol/L   Glucose, Bld 293 (H) 70 - 99 mg/dL    Comment: Glucose reference range applies only to samples taken after fasting for at least 8 hours.   BUN 32 (H) 8 - 23 mg/dL   Creatinine, Ser 1.66 (H) 0.61 - 1.24 mg/dL   Calcium 9.2 8.9 - 10.3 mg/dL   GFR, Estimated 41 (L) >60 mL/min    Comment: (NOTE) Calculated using the CKD-EPI Creatinine Equation (2021)    Anion gap 5 5 - 15    Comment: Performed at [facility], [address] [place], [place] [address]  CBC     Status: Abnormal   Collection Time: 06/14/22  4:15 AM  Result Value Ref Range   WBC 7.5 4.0 - 10.5 K/uL   RBC 3.51 (L) 4.22 - 5.81 MIL/uL   Hemoglobin 9.1 (L) 13.0 - 17.0 g/dL   HCT 29.4 (L) 39.0 - 52.0 %   MCV 83.8 80.0 - 100.0 fL   MCH 25.9 (L) 26.0 - 34.0 pg   MCHC 31.0 30.0 - 36.0 g/dL   RDW 14.3 11.5 - 15.5 %   Platelets 276 150 - 400 K/uL   nRBC 0.0 0.0 - 0.2 %    Comment: Performed at [facility], [address] [place], [place] [address]  Glucose, capillary     Status: Abnormal   Collection Time: 06/14/22  7:52 AM  Result Value Ref Range   Glucose-Capillary 309 (H) 70 - 99 mg/dL    Comment: Glucose reference range applies only to samples taken after fasting for at least 8 hours.    CT Renal Stone Study  Result Date: 06/13/2022 CLINICAL DATA:   Abdominal and flank pain suspected kidney stone, hematuria, has catheter EXAM: CT ABDOMEN AND PELVIS WITHOUT CONTRAST TECHNIQUE: Multidetector CT imaging of the abdomen and pelvis was performed following the standard protocol without IV contrast. RADIATION DOSE REDUCTION: This exam was performed according to the departmental dose-optimization program which includes automated exposure control, adjustment of the mA and/or kV according to patient size and/or use of iterative reconstruction technique. COMPARISON:  01/30/2022 FINDINGS: Lower chest: Mild dependent atelectasis at lung bases. Hepatobiliary: Gallbladder surgically absent. Liver normal appearance. No biliary dilatation. Pancreas: Normal appearance Spleen: Normal appearance Adrenals/Urinary Tract: Adrenal glands normal appearance. BILATERAL perinephric stranding increased from previous exam. No hydronephrosis or hydroureter. Bladder is decompressed, contains minimal urine and air. Mild surrounding perivesicular infiltrative changes. Foley catheter balloon inflated within prostate gland. Stomach/Bowel: Scattered stool throughout colon.  Sigmoid anastomosis. Bowel loops otherwise unremarkable. Stomach normal appearance. Appendix not visualized. Vascular/Lymphatic: Atherosclerotic calcifications aorta and iliac arteries without aneurysm. Additional vascular calcifications at coronary arteries and visceral arteries. No adenopathy. Reproductive: Enlarged prostate gland unchanged. Other: No free air or free fluid.  No hernia. Musculoskeletal: Osseous structures unremarkable. IMPRESSION: Foley catheter balloon inflated within prostate gland; recommend repositioning into bladder. Decompressed bladder with perivesicular stranding, as well as BILATERAL perinephric stranding, urinary tract infection/cystitis not excluded, recommend correlation with urinalysis. Enlarged prostate gland unchanged. No acute intra-abdominal or intrapelvic abnormalities. Aortic Atherosclerosis  (ICD10-I70.0). Electronically Signed   By: Lavonia Dana M.D.   On: 06/13/2022 15:34    Review of Systems  Constitutional: Negative.   HENT:  Positive for hearing loss.   Respiratory: Negative.    Cardiovascular: Negative.   Gastrointestinal:  Positive for abdominal pain.  Genitourinary:  Positive for difficulty urinating, dysuria and hematuria.  Musculoskeletal: Negative.   Skin: Negative.   Neurological: Negative.   Hematological: Negative.   Psychiatric/Behavioral: Negative.     Blood pressure 116/67, pulse 68, temperature 98.5 F (36.9 C), resp. rate 19, height 5\' 10"  (1.778 m), weight 108.5 kg, SpO2 97 %. Physical Exam Vitals and nursing note reviewed.  Constitutional:      General: He is not in acute distress.    Appearance: He is obese. He is not ill-appearing.  HENT:     Head: Normocephalic and atraumatic.  Eyes:     General: No scleral icterus.    Conjunctiva/sclera: Conjunctivae normal.  Cardiovascular:     Rate and Rhythm: Normal rate and regular rhythm.  Pulmonary:     Effort: Pulmonary effort is normal.  Abdominal:     Palpations: Abdomen is soft.  Genitourinary:    Penis: Normal.      Comments: Foley catheter in place.  Old blood at the meatus and foreskin Musculoskeletal:        General: No swelling.  Skin:    General: Skin is warm and dry.  Neurological:     Mental Status: He is alert.  Psychiatric:        Mood and Affect: Mood normal.        Behavior: Behavior normal.        Judgment: Judgment normal.     Assessment/Plan: 1.  History of pulmonary thromboembolism June 2023-initial plan was treatment with full dose anticoagulation for 6 months and reassessment.  He remains on full anticoagulation. 2.  Chronic urinary retention secondary to  severe prostatic hypertrophy and necessity for chronic Foley catheterization.  Status post right prostatic artery embolization in February of this year.  Plan for voiding trial this coming week.  Status of  intervention (TURP versus HoLEP) remains unknown at this time. 3.  Acute urinary retention secondary to retained clots within the bladder  I had a long discussion with the patient concerning all of these issues.  His acute urinary retention has been resolved with replacement of his Foley catheter with a three-way irrigating system.  He passed several clots yesterday and there is no evidence of continued bleeding.  Currently his urine is yellow without any blood tingeing.  I do note some appearing blood at the bottom of his Foley bag.  For now, continue GU irrigation per urology.  As for his need for chronic anticoagulation, decision needs to take into account risk of bleeding and risk of recurrent pulmonary thromboembolism as he is past the initial treatment goal of 6 months.  Given his comorbidities his risk of  recurrent pulmonary thromboembolism is likely intermediate to high and therefore many would recommend continued anticoagulation if possible.  His bleeding risk also appears relatively high given his current hematuria as well as his fall risk.  Therefore I believe that consideration for a low-dose regimen is likely warranted.  This decision needs to be made in conjunction with his family and his primary care physician.  In the meantime, there is no indication for acute inferior vena cava filter placement.  During this acute hospitalization transition from Eliquis to prophylactic anticoagulation using heparin or low molecular weight heparin is prudent.  Consideration for a temporary IVC filter can be made should his anticoagulation need to be held for a prolonged time in the perioperative setting if aggressive prostatic surgery is contemplated.  This decision can be made in conjunction with his urology team on an outpatient basis.    Justin Griffith 06/14/2022, 9:23 AM

## 2022-06-14 NOTE — Plan of Care (Signed)

## 2022-06-15 ENCOUNTER — Ambulatory Visit: Payer: PPO | Admitting: Urology

## 2022-06-15 DIAGNOSIS — N3289 Other specified disorders of bladder: Secondary | ICD-10-CM | POA: Diagnosis not present

## 2022-06-15 DIAGNOSIS — R319 Hematuria, unspecified: Secondary | ICD-10-CM | POA: Diagnosis not present

## 2022-06-15 DIAGNOSIS — N401 Enlarged prostate with lower urinary tract symptoms: Secondary | ICD-10-CM

## 2022-06-15 DIAGNOSIS — E1165 Type 2 diabetes mellitus with hyperglycemia: Secondary | ICD-10-CM | POA: Diagnosis not present

## 2022-06-15 DIAGNOSIS — R339 Retention of urine, unspecified: Secondary | ICD-10-CM | POA: Diagnosis not present

## 2022-06-15 DIAGNOSIS — T83028A Displacement of other indwelling urethral catheter, initial encounter: Secondary | ICD-10-CM

## 2022-06-15 DIAGNOSIS — D519 Vitamin B12 deficiency anemia, unspecified: Secondary | ICD-10-CM | POA: Insufficient documentation

## 2022-06-15 DIAGNOSIS — Z794 Long term (current) use of insulin: Secondary | ICD-10-CM

## 2022-06-15 DIAGNOSIS — N138 Other obstructive and reflux uropathy: Secondary | ICD-10-CM | POA: Diagnosis not present

## 2022-06-15 LAB — CBC
HCT: 27.6 % — ABNORMAL LOW (ref 39.0–52.0)
Hemoglobin: 8.6 g/dL — ABNORMAL LOW (ref 13.0–17.0)
MCH: 26.3 pg (ref 26.0–34.0)
MCHC: 31.2 g/dL (ref 30.0–36.0)
MCV: 84.4 fL (ref 80.0–100.0)
Platelets: 291 10*3/uL (ref 150–400)
RBC: 3.27 MIL/uL — ABNORMAL LOW (ref 4.22–5.81)
RDW: 14.5 % (ref 11.5–15.5)
WBC: 6.6 10*3/uL (ref 4.0–10.5)
nRBC: 0 % (ref 0.0–0.2)

## 2022-06-15 LAB — GLUCOSE, CAPILLARY
Glucose-Capillary: 337 mg/dL — ABNORMAL HIGH (ref 70–99)
Glucose-Capillary: 281 mg/dL — ABNORMAL HIGH (ref 70–99)
Glucose-Capillary: 271 mg/dL — ABNORMAL HIGH (ref 70–99)
Glucose-Capillary: 248 mg/dL — ABNORMAL HIGH (ref 70–99)

## 2022-06-15 MED ORDER — INSULIN ASPART 100 UNIT/ML IJ SOLN
4.0000 [IU] | Freq: Three times a day (TID) | INTRAMUSCULAR | Status: DC
  Administered 2022-06-15 – 2022-06-16 (×4): 4 [IU] via SUBCUTANEOUS
  Filled 2022-06-15 (×4): qty 1

## 2022-06-15 MED ORDER — HYDROMORPHONE HCL 1 MG/ML IJ SOLN
0.5000 mg | INTRAMUSCULAR | Status: DC | PRN
  Administered 2022-06-15 (×3): 0.5 mg via INTRAVENOUS
  Filled 2022-06-15 (×3): qty 1

## 2022-06-15 MED ORDER — INSULIN DETEMIR 100 UNIT/ML ~~LOC~~ SOLN
20.0000 [IU] | Freq: Two times a day (BID) | SUBCUTANEOUS | Status: DC
  Administered 2022-06-15 – 2022-06-17 (×5): 20 [IU] via SUBCUTANEOUS
  Filled 2022-06-15 (×6): qty 0.2

## 2022-06-15 MED ORDER — INSULIN DETEMIR 100 UNIT/ML ~~LOC~~ SOLN
16.0000 [IU] | Freq: Two times a day (BID) | SUBCUTANEOUS | Status: DC
  Administered 2022-06-15: 16 [IU] via SUBCUTANEOUS
  Filled 2022-06-15 (×2): qty 0.16

## 2022-06-15 MED ORDER — VITAMIN B-12 1000 MCG PO TABS: 1000.0000 ug | ORAL_TABLET | Freq: Every day | ORAL | Status: DC

## 2022-06-15 MED ORDER — PHENAZOPYRIDINE HCL 100 MG PO TABS
100.0000 mg | ORAL_TABLET | Freq: Three times a day (TID) | ORAL | Status: DC
  Administered 2022-06-15 – 2022-06-16 (×3): 100 mg via ORAL
  Filled 2022-06-15 (×4): qty 1

## 2022-06-15 NOTE — Progress Notes (Addendum)
Progress Note   Patient: Justin Griffith [id number] DOB: 08-21-1940 DOA: 06/13/2022     1 DOS: the patient was seen and examined on 06/15/2022   Brief hospital course: LUMAN BASSET is a 82 y.o. male with medical history significant of hypertension, hyperlipidemia, diabetes mellitus, COPD/asthma, stroke, depression, OSA, colon cancer, BPH with urinary retention with chronic indwelling catheter placement, PE on Eliquis, obesity, CAD, CABG, CKD-3B, dCHF, PAD, chronic indwelling Foley cath, who presents to ED due to Foley cath not draining.  He has some gross hematuria and Foley catheter was advanced.  Patient has been seen by urology, apparently patient had recurrent obstruction with Foley catheter with blood clots.  Anticoagulation was discontinued.  Patient is also seen by vascular surgery.  Not planning for IVC filter.  Patient has completed 6 months of anticoagulation for his a first episode of PE. Anticoagulation is on hold.  Hematuria is improving.  Patient was also started on treated with Rocephin, urine culture came back without growth, urology still want antibiotics due to obstruction.  Switch to Keflex at time of discharge.    Principal Problem:   Urinary retention Active Problems:   BPH with obstruction/lower urinary tract symptoms Foley dependent   Gross hematuria   UTI (urinary tract infection)   CAD (coronary artery disease)   Chronic obstructive pulmonary disease (COPD) (HCC)   Chronic diastolic CHF (congestive heart failure) (HCC)   Pulmonary embolism (HCC)   History of pulmonary embolism 09/18/21   HTN (hypertension)   HLD (hyperlipidemia)   Stage 3b chronic kidney disease (CKD) (HCC)   Type II diabetes mellitus with renal manifestations (HCC)   History of CVA (cerebrovascular accident)   Sleep apnea   Obesity (BMI 30-39.9)   Senile dementia with delirium ([place])   Pressure injury of skin   Assessment and Plan:  Urinary retention due to gross hematuria and BPH with  obstruction/lower urinary tract symptoms Foley dependent Possible urinary tract infection with hematuria. Patient condition was better, patient did well with voiding trial, Foley catheter was removed.  However, patient has severe penile pain afterwards.  Dilaudid started.  Will keep patient for another day until this is better. Urology recommended continue oral antibiotics at time of discharge, will continue Rocephin for now.   History of pulmonary embolism 09/18/21 Anticoagulation on hold due to active bleeding.  Per vascular surgery, patient was supposed to be taking Eliquis for 6 months, which has completed.  However, patient does have moderate to high risk for recurrent thrombosis.  Discussed with patient wife, will continue hold anticoagulation. Since Foley catheter is removed, may consider restart anticoagulation in a week or 2.   Dementia with delirium. Condition stable.   Essential hypertension Continue home medicine.   CAD (coronary artery disease):  -Continue Crestor   Chronic obstructive pulmonary disease (COPD) ([place]):  Stable.   Type II diabetes mellitus with renal manifestations ([place]):  Uncontrolled type 2 diabetes with hyperglycemia. Hemoglobin A1c 11.5 on 06/02/2022.  Increase Levemir to 20 units twice a day, continue scheduled NovoLog and sliding scale insulin.   HLD (hyperlipidemia) - Crestor    Chronic diastolic CHF (congestive heart failure) ([place]): 2D echo on 09/20/2021 showed EF of 60 to 65%.   Patient euvolemic, no exacerbation.   Stage 3b chronic kidney disease (CKD) ([place]): stable Hyponatremia. Stable.   History of CVA (cerebrovascular accident) -  Crestor   Obesity with body mass index (BMI) of 30.0 to 39.9: Body weight 113.1 kg, BMI 35.77  -Diet and exercise.  Subjective:  Patient complaining of severe penile pain after removal of Foley catheter.  He was able to void, residual 90 after removal of Foley catheter.  Physical Exam: Vitals:    06/15/22 0455 06/15/22 0732 06/15/22 0742 06/15/22 1100  BP:  134/62 131/68 124/65  Pulse:  75 76 78  Resp:  18 18 16   Temp:  97.9 F (36.6 C) 98.8 F (37.1 C) 98 F (36.7 C)  TempSrc:  Oral  Oral  SpO2:  95% 95% 92%  Weight: 110.9 kg     Height:       General exam: Uncomfortable due to penile pain. Respiratory system: Clear to auscultation. Respiratory effort normal. Cardiovascular system: S1 & S2 heard, RRR. No JVD, murmurs, rubs, gallops or clicks. No pedal edema. Gastrointestinal system: Abdomen is nondistended, soft and nontender. No organomegaly or masses felt. Normal bowel sounds heard. Central nervous system: Alert and oriented. No focal neurological deficits. Extremities: Symmetric 5 x 5 power. Skin: No rashes, lesions or ulcers Psychiatry: Judgement and insight appear normal. Mood & affect appropriate.    Data Reviewed:  Lab results reviewed.  Family Communication: wife updated over the phone  Disposition: Status is: Inpatient Remains inpatient appropriate because: Severity of disease, IV treatment.     Time spent: 35 minutes  Author: Sharen Hones, MD 06/15/2022 2:39 PM  For on call review [email or web address].

## 2022-06-15 NOTE — Telephone Encounter (Signed)
I can't edit a referral once it's been placed. Can this be removed on your end?

## 2022-06-15 NOTE — TOC Progression Note (Signed)
Transition of Care [facility]) - Progression Note    Patient Details  Name: Justin Griffith MRN: [id number] Date of Birth: 1940-09-04  Transition of Care [facility]) CM/SW [place], RN Phone Number: 06/15/2022, 4:54 PM  Clinical Narrative:    Met with the patient , he stated that he does not want to go to a rehab and prefers to go home with his wife and he has [employer] already  set up, he has a rolling walker and a 3 in 1 He refuses to go to STR   Expected Discharge Plan: [place] Barriers to Discharge: No Barriers Identified  Expected Discharge Plan and Services   Discharge Planning Services: CM Consult   Living arrangements for the past 2 months: Single Family Home                 DME Arranged: N/A DME Agency: NA                   Social Determinants of Health (SDOH) Interventions SDOH Screenings   Food Insecurity: No Food Insecurity (06/13/2022)  Housing: Low Risk  (06/13/2022)  Transportation Needs: No Transportation Needs (06/13/2022)  Utilities: Not At Risk (06/13/2022)  Alcohol Screen: Low Risk  (03/03/2022)  Depression (PHQ2-9): High Risk (04/03/2022)  Financial Resource Strain: Low Risk  (03/03/2022)  Physical Activity: Inactive (03/03/2022)  Social Connections: Moderately Integrated (03/03/2022)  Stress: No Stress Concern Present (03/03/2022)  Tobacco Use: Medium Risk (06/13/2022)    Readmission Risk Interventions    11/24/2021   10:33 AM 09/21/2021   10:42 AM 05/21/2020   10:01 AM  Readmission Risk Prevention Plan  Transportation Screening Complete Complete Complete  PCP or Specialist Appt within 3-5 Days  Complete Complete  HRI or Home Care Consult  Complete Complete  Social Work Consult for [place] Planning/Counseling  Complete Complete  Palliative Care Screening  Not Applicable Not Applicable  Medication Review Press photographer) Complete Complete Complete  PCP or Specialist appointment within 3-5 days of discharge Complete     HRI or Home Care Consult Complete    SW Recovery Care/Counseling Consult Complete    Palliative Care Screening Not [place] Complete

## 2022-06-15 NOTE — Progress Notes (Addendum)
Urology Inpatient Progress Note  Subjective: No acute events overnight. Urine culture finalized with no growth, on antibiotics as below. Foley catheter in place draining cloudy, yellow urine. He was due for outpatient voiding trial with Dr. Diamantina Providence today s/p PAE with Dr. Serafina Royals on 05/07/2022.  Anti-infectives: Anti-infectives (From admission, onward)    Start     Dose/Rate Route Frequency Ordered Stop   06/14/22 1600  cefTRIAXone (ROCEPHIN) 2 g in sodium chloride 0.9 % 100 mL IVPB        2 g 200 mL/hr over 30 Minutes Intravenous Every 24 hours 06/13/22 1621     06/13/22 1630  cefTRIAXone (ROCEPHIN) 1 g in sodium chloride 0.9 % 100 mL IVPB  Status:  Discontinued        1 g 200 mL/hr over 30 Minutes Intravenous Every 24 hours 06/13/22 1620 06/13/22 1621   06/13/22 1630  cefTRIAXone (ROCEPHIN) 1 g in sodium chloride 0.9 % 100 mL IVPB        1 g 200 mL/hr over 30 Minutes Intravenous  Once 06/13/22 1621 06/13/22 1701   06/13/22 1545  cefTRIAXone (ROCEPHIN) 1 g in sodium chloride 0.9 % 100 mL IVPB        1 g 200 mL/hr over 30 Minutes Intravenous  Once 06/13/22 1543 06/13/22 1615       Current Facility-Administered Medications  Medication Dose Route Frequency Provider Last Rate Last Admin   acetaminophen (TYLENOL) tablet 650 mg  650 mg Oral Q6H PRN Ivor Costa, MD       albuterol (PROVENTIL) (2.5 MG/3ML) 0.083% nebulizer solution 3 mL  3 mL Inhalation Q4H PRN Ivor Costa, MD       cefTRIAXone (ROCEPHIN) 2 g in sodium chloride 0.9 % 100 mL IVPB  2 g Intravenous Q24H Ivor Costa, MD 200 mL/hr at 06/14/22 1812 2 g at 06/14/22 1812   Chlorhexidine Gluconate Cloth 2 % PADS 6 each  6 each Topical Daily Sharen Hones, MD   6 each at 06/14/22 1240   cloNIDine (CATAPRES) tablet 0.1 mg  0.1 mg Oral BID Ivor Costa, MD   0.1 mg at 06/14/22 2230   dextromethorphan-guaiFENesin (MUCINEX DM) 30-600 MG per 12 hr tablet 1 tablet  1 tablet Oral BID PRN Ivor Costa, MD       gabapentin (NEURONTIN) capsule 400 mg   400 mg Oral TID Ivor Costa, MD   400 mg at 06/14/22 2230   hydrALAZINE (APRESOLINE) injection 5 mg  5 mg Intravenous Q2H PRN Ivor Costa, MD       hydrALAZINE (APRESOLINE) tablet 100 mg  100 mg Oral TID Ivor Costa, MD   100 mg at 06/14/22 2230   insulin aspart (novoLOG) injection 0-5 Units  0-5 Units Subcutaneous QHS Ivor Costa, MD   4 Units at 06/14/22 2230   insulin aspart (novoLOG) injection 0-9 Units  0-9 Units Subcutaneous TID WC Ivor Costa, MD   5 Units at 06/14/22 1811   insulin aspart (novoLOG) injection 4 Units  4 Units Subcutaneous TID WC Sharen Hones, MD       insulin detemir (LEVEMIR) injection 16 Units  16 Units Subcutaneous BID Sharen Hones, MD       losartan (COZAAR) tablet 100 mg  100 mg Oral Daily Ivor Costa, MD   100 mg at 06/14/22 1043   morphine (PF) 2 MG/ML injection 1 mg  1 mg Intravenous Q4H PRN Ivor Costa, MD       multivitamin with minerals tablet 1 tablet  1 tablet Oral  Daily Ivor Costa, MD   1 tablet at 06/14/22 1044   nystatin (MYCOSTATIN/NYSTOP) topical powder   Topical BID Ivor Costa, MD   Given at 06/14/22 2232   olopatadine (PATANOL) 0.1 % ophthalmic solution 1 drop  1 drop Both Eyes BID PRN Renda Rolls, RPH       ondansetron [facility]) injection 4 mg  4 mg Intravenous Q8H PRN Ivor Costa, MD       pantoprazole (PROTONIX) EC tablet 40 mg  40 mg Oral Daily Ivor Costa, MD   40 mg at 06/14/22 1043   polyethylene glycol powder (GLYCOLAX/MIRALAX) container 17 g  17 g Oral BID PRN Ivor Costa, MD       rosuvastatin (CRESTOR) tablet 10 mg  10 mg Oral QHS Ivor Costa, MD   10 mg at 06/14/22 2232   sodium chloride irrigation 0.9 % 3,000 mL  3,000 mL Irrigation Continuous Ivor Costa, MD   3,000 mL at 06/13/22 1857   torsemide (DEMADEX) tablet 40 mg  40 mg Oral Q1400 Ivor Costa, MD   40 mg at 06/14/22 1043   umeclidinium bromide (INCRUSE ELLIPTA) 62.5 MCG/ACT 1 puff  1 puff Inhalation Daily Ivor Costa, MD   1 puff at 06/14/22 1125   Objective: Vital signs in last 24  hours: Temp:  [97.9 F (36.6 C)-98.8 F (37.1 C)] 98.8 F (37.1 C) (03/25 0742) Pulse Rate:  [60-76] 76 (03/25 0742) Resp:  [16-20] 18 (03/25 0742) BP: (122-146)/(62-72) 131/68 (03/25 0742) SpO2:  [94 %-97 %] 95 % (03/25 0742) Weight:  [110.9 kg] 110.9 kg (03/25 0455)  Intake/Output from previous day: 03/24 0701 - 03/25 0700 In: -  Out: 2850 [Urine:2850] Intake/Output this shift: No intake/output data recorded.  Physical Exam Vitals reviewed.  Constitutional:      General: He is not in acute distress.    Appearance: He is not ill-appearing, toxic-appearing or diaphoretic.  HENT:     Head: Normocephalic and atraumatic.  Pulmonary:     Effort: Pulmonary effort is normal. No respiratory distress.  Skin:    General: Skin is warm and dry.  Neurological:     Mental Status: He is alert and oriented to person, place, and time.  Psychiatric:        Mood and Affect: Mood normal.        Behavior: Behavior normal.    Lab Results:  Recent Labs    06/13/22 1454 06/14/22 0415  WBC 11.5* 7.5  HGB 11.3* 9.1*  HCT 35.7* 29.4*  PLT 335 276   BMET Recent Labs    06/13/22 1454 06/14/22 0415  NA 132* 132*  K 3.9 3.8  CL 96* 101  CO2 25 26  GLUCOSE 499* 293*  BUN 29* 32*  CREATININE 1.43* 1.66*  CALCIUM 10.0 9.2   PT/INR Recent Labs    06/13/22 1752  LABPROT 15.5*  INR 1.2   Assessment & Plan: 82 year old male with PMH BPH with urinary retention managed with indwelling Foley s/p PAE 1 month ago, admitted with hematuria and clot catheter occlusion due to malpositioned Foley balloon.  Gross hematuria has resolved.  I offered him inpatient voiding trial and he accepted.  Using sterile technique, I instilled 60 cc of sterile saline through the Foley catheter in place and gently irrigated.  Catheter irrigated easily with efflux of 60 cc of cloudy, yellow urine. Next, I drained 9 cc of water from the Foley catheter balloon and removed the 20 [place] coud three-way hematuria  catheter in its  entirety.  Patient tolerated well.  Please follow-up with postvoid bladder scan in at least 4 hours, or sooner if unable to void and having pain.  Recommend continued antibiotics to sterilize the urine given Foley chronicity despite negative admission culture. Will continue to follow.  Debroah Loop, PA-C 06/15/2022

## 2022-06-15 NOTE — Evaluation (Signed)
Physical Therapy Evaluation Patient Details Name: Justin Griffith MRN: [id number] DOB: [birth year] Today's Date: 06/15/2022  History of Present Illness  Pt is an 82 yo male with PMH of dementia, HTN, HLD, COPD, DM, PE, chronic indwelling catheter. Admitted for  hematuria.   Clinical Impression  Patient alert, agreeable to PT reported no pain. He stated at baseline he lives with his wife that assists with his ADLs as needed as well as the stairs, utilizes a RW. Pt did report 3 recent falls as well as HHPT.  The patient was able to transfer to EOB with minA (normally sleeps in a recliner at home). Sit <> stand from EOB, recliner, and BSC during session, CGA with RW, extended time. Did have one small LOB that pt was able to self correct. Pericare in sitting, supervision though pt did ask for assistance. He was able to ambulate ~61ft and then additional 7ft with RW And CGA (chair follow for safety). Pt reported fatigue at end of session.  Overall the patient demonstrated deficits (see "PT Problem List") that impede the patient's functional abilities, safety, and mobility and would benefit from skilled PT intervention. Recommendation of continued skilled PT services with intermittent assistance from wife.         Recommendations for follow up therapy are one component of a multi-disciplinary discharge planning process, led by the attending physician.  Recommendations may be updated based on patient status, additional functional criteria and insurance authorization.  Follow Up Recommendations       Assistance Recommended at Discharge Intermittent Supervision/Assistance  Patient can return home with the following  A little help with walking and/or transfers;A little help with bathing/dressing/bathroom;Help with stairs or ramp for entrance    Equipment Recommendations None recommended by PT  Recommendations for Other Services       Functional Status Assessment Patient has had a recent decline in  their functional status and demonstrates the ability to make significant improvements in function in a reasonable and predictable amount of time.     Precautions / Restrictions Precautions Precautions: Fall Restrictions Weight Bearing Restrictions: No      Mobility  Bed Mobility Overal bed mobility: Needs Assistance Bed Mobility: Supine to Sit     Supine to sit: Min assist, HOB elevated     General bed mobility comments: handheld assist (pt normally sleeps in a recliner at home)    Transfers Overall transfer level: Needs assistance Equipment used: Rolling walker (2 wheels) Transfers: Sit to/from Stand Sit to Stand: Min guard, Min assist                Ambulation/Gait Ambulation/Gait assistance: Min guard, Min assist Gait Distance (Feet):  (1ft, 74ft) Assistive device: Rolling walker (2 wheels)         General Gait Details: one small LOB coming up into standing, corrected with minA and RW.  Stairs            Wheelchair Mobility    Modified Rankin (Stroke Patients Only)       Balance Overall balance assessment: Needs assistance Sitting-balance support: Feet supported Sitting balance-Leahy Scale: Good Sitting balance - Comments: able to perform pericare in sitting     Standing balance-Leahy Scale: Fair                               Pertinent Vitals/Pain Pain Assessment Pain Assessment: No/denies pain    Home Living Family/patient expects to be discharged to::  Private residence Living Arrangements: Spouse/significant other Available Help at Discharge: Family Type of Home: House Home Access: Stairs to enter Entrance Stairs-Rails: None Entrance Stairs-Number of Steps: 1   Home Layout: One level Home Equipment: Conservation officer, nature (2 wheels);Cane - single point      Prior Function Prior Level of Function : Needs assist       Physical Assist : Mobility (physical);ADLs (physical) Mobility (physical): Transfers;Stairs ADLs  (physical): Bathing;Dressing;IADLs Mobility Comments: uses RW only, wife assists with one step into home. occasionally needs help to stand ADLs Comments: wife assists with aDLs as needed per pt     Hand Dominance   Dominant Hand: Right    Extremity/Trunk Assessment   Upper Extremity Assessment Upper Extremity Assessment: Generalized weakness    Lower Extremity Assessment Lower Extremity Assessment: Generalized weakness    Cervical / Trunk Assessment Cervical / Trunk Assessment: Normal  Communication   Communication: HOH  Cognition Arousal/Alertness: Awake/alert Behavior During Therapy: WFL for tasks assessed/performed Overall Cognitive Status: Within Functional Limits for tasks assessed                                          General Comments      Exercises     Assessment/Plan    PT Assessment Patient needs continued PT services  PT Problem List Decreased strength;Decreased mobility;Decreased activity tolerance;Decreased balance       PT Treatment Interventions DME instruction;Therapeutic activities;Gait training;Therapeutic exercise;Stair training;Balance training;Functional mobility training;Neuromuscular re-education    PT Goals (Current goals can be found in the Care Plan section)  Acute Rehab PT Goals Patient Stated Goal: to get stronger PT Goal Formulation: With patient Time For Goal Achievement: 06/29/22 Potential to Achieve Goals: Good    Frequency Min 2X/week     Co-evaluation               AM-PAC PT "6 Clicks" Mobility  Outcome Measure Help needed turning from your back to your side while in a flat bed without using bedrails?: A Little Help needed moving from lying on your back to sitting on the side of a flat bed without using bedrails?: A Little Help needed moving to and from a bed to a chair (including a wheelchair)?: A Little Help needed standing up from a chair using your arms (e.g., wheelchair or bedside chair)?: A  Little Help needed to walk in hospital room?: A Little Help needed climbing 3-5 steps with a railing? : A Little 6 Click Score: 18    End of Session Equipment Utilized During Treatment: Gait belt Activity Tolerance: Patient tolerated treatment well Patient left: in chair;with chair alarm set;with call bell/phone within reach Nurse Communication: Mobility status PT Visit Diagnosis: Other abnormalities of gait and mobility (R26.89);History of falling (Z91.81)    Time: [id number] PT Time Calculation (min) (ACUTE ONLY): 32 min   Charges:   PT Evaluation $PT Eval Low Complexity: 1 Low PT Treatments $Therapeutic Activity: 23-37 mins        Lieutenant Diego PT, DPT 1:06 PM,06/15/22

## 2022-06-15 NOTE — Inpatient Diabetes Management (Signed)
Inpatient Diabetes Program Recommendations  AACE/ADA: New Consensus Statement on Inpatient Glycemic Control   Target Ranges:  Prepandial:   less than 140 mg/dL      Peak postprandial:   less than 180 mg/dL (1-2 hours)      Critically ill patients:  140 - 180 mg/dL    Latest Reference Range & Units 06/14/22 07:52 06/14/22 11:59 06/14/22 18:02 06/14/22 21:31 06/15/22 07:40 06/15/22 11:59  Glucose-Capillary 70 - 99 mg/dL 309 (H) 331 (H) 262 (H) 349 (H) 337 (H) 281 (H)    Latest Reference Range & Units 02/04/22 14:13 06/02/22 14:30  HB A1C (BAYER DCA - WAIVED) 4.8 - 5.6 % 6.3 (H) 11.5 (H)   Review of Glycemic Control  Diabetes history: DM2 Outpatient Diabetes medications: Metformin 500 mg BID, Rybelsus 14 mg daily Current orders for Inpatient glycemic control: Levemir 16 units BID, Novolog 4 units TID with meals, Novolog 0-9 units TID with meals, Novolog 0-5 units QHS  Inpatient Diabetes Program Recommendations:    Insulin: Noted Levemir increased to 16 units BID yesterday evening. Please consider increasing Levemir to 20 units BID and meal coverage to Novolog 8 units TID with meals.  Outpatient: Patient has taken Semglee and Novolog in the past (stopped on 02/10/22 per PCP note). A1C has increased to 11.5% on 06/02/22 (was 6.3% on 02/04/22).  May need to consider discharging on insulin.  NOTE: Spoke with patient at bedside. Patient sitting up in chair and very hard of hearing. Patient confirms that he is taking Metformin and Rybelsus for DM and that his DM is managed by PCP. Patient confirms that he has been on insulin in the past but not sure which insulin(s) he was taking. Patient states he does not check glucose at home. Patient reports he is not sure what his last A1C was. Patient stated that he is in a lot of penile pain and burning with urination and not in the mood to talk. Informed patient that I would try to look back in chart to see which insulins he was taking in the past. Noted  patient seen PCP on 02/04/22 and at that time he was on Semgle 5 units QHS and Novolog 1-9 units for correction. Per office note on 02/10/22 medication changes notes that [facility] and Novolog were discontinued. Per other office notes from 02/10/22-04/03/22 but did not have any other DM medications prescribed. Per office note on 04/03/22, patient seen Dr. Wynetta Emery and was prescribed Rybelsus 3 mg daily. On 04/21/22 patient seen Dr. Wynetta Emery and Rybelsus was increased to 7 mg daily. On 06/02/22 patient seen Dr. Wynetta Emery and Rybelsus was increased to 14 mg daily and Metformin 500 mg BID was ordered.   Thanks, Justin Alderman, RN, MSN, [place] Diabetes Coordinator Inpatient Diabetes Program [phone] (Team Pager from 8am to [place])

## 2022-06-15 NOTE — TOC Progression Note (Deleted)
Transition of Care [facility]) - Progression Note    Patient Details  Name: SANDI ZELENKA MRN: [id number] Date of Birth: [birth year]  Transition of Care [facility]) CM/SW [place], RN Phone Number: 06/15/2022, 12:11 PM  Clinical Narrative:     Reached out to [facility] with [facility] to see if they can accept him for RN [facility] for IV infusion, Will need to set up with [place] with [facility] will arrange, She will also come to the hosptial for teaching       Expected Discharge Plan and Services                                               Social Determinants of Health (SDOH) Interventions SDOH Screenings   Food Insecurity: No Food Insecurity (06/13/2022)  Housing: Low Risk  (06/13/2022)  Transportation Needs: No Transportation Needs (06/13/2022)  Utilities: Not At Risk (06/13/2022)  Alcohol Screen: Low Risk  (03/03/2022)  Depression (PHQ2-9): High Risk (04/03/2022)  Financial Resource Strain: Low Risk  (03/03/2022)  Physical Activity: Inactive (03/03/2022)  Social Connections: Moderately Integrated (03/03/2022)  Stress: No Stress Concern Present (03/03/2022)  Tobacco Use: Medium Risk (06/13/2022)    Readmission Risk Interventions    11/24/2021   10:33 AM 09/21/2021   10:42 AM 05/21/2020   10:01 AM  Readmission Risk Prevention Plan  Transportation Screening Complete Complete Complete  PCP or Specialist Appt within 3-5 Days  Complete Complete  HRI or Home Care Consult  Complete Complete  Social Work Consult for [place] Planning/Counseling  Complete Complete  Palliative Care Screening  Not Applicable Not Applicable  Medication Review Press photographer) Complete Complete Complete  PCP or Specialist appointment within 3-5 days of discharge Complete    HRI or Home Care Consult Complete    SW Recovery Care/Counseling Consult Complete    Palliative Care Screening Not [place] Complete

## 2022-06-15 NOTE — Telephone Encounter (Signed)
Thank you so much

## 2022-06-16 DIAGNOSIS — R339 Retention of urine, unspecified: Secondary | ICD-10-CM | POA: Diagnosis not present

## 2022-06-16 DIAGNOSIS — N3289 Other specified disorders of bladder: Secondary | ICD-10-CM | POA: Diagnosis not present

## 2022-06-16 DIAGNOSIS — Z86711 Personal history of pulmonary embolism: Secondary | ICD-10-CM | POA: Diagnosis not present

## 2022-06-16 DIAGNOSIS — N178 Other acute kidney failure: Secondary | ICD-10-CM

## 2022-06-16 DIAGNOSIS — N138 Other obstructive and reflux uropathy: Secondary | ICD-10-CM | POA: Diagnosis not present

## 2022-06-16 DIAGNOSIS — R319 Hematuria, unspecified: Secondary | ICD-10-CM | POA: Diagnosis not present

## 2022-06-16 DIAGNOSIS — N401 Enlarged prostate with lower urinary tract symptoms: Secondary | ICD-10-CM | POA: Diagnosis not present

## 2022-06-16 LAB — BASIC METABOLIC PANEL WITH GFR
Anion gap: 8 (ref 5–15)
BUN: 45 mg/dL — ABNORMAL HIGH (ref 8–23)
CO2: 25 mmol/L (ref 22–32)
Calcium: 8.8 mg/dL — ABNORMAL LOW (ref 8.9–10.3)
Chloride: 93 mmol/L — ABNORMAL LOW (ref 98–111)
Creatinine, Ser: 2.08 mg/dL — ABNORMAL HIGH (ref 0.61–1.24)
GFR, Estimated: 31 mL/min — ABNORMAL LOW
Glucose, Bld: 249 mg/dL — ABNORMAL HIGH (ref 70–99)
Potassium: 3.9 mmol/L (ref 3.5–5.1)
Sodium: 126 mmol/L — ABNORMAL LOW (ref 135–145)

## 2022-06-16 LAB — BASIC METABOLIC PANEL
Anion gap: 15 (ref 5–15)
BUN: 50 mg/dL — ABNORMAL HIGH (ref 8–23)
CO2: 26 mmol/L (ref 22–32)
Calcium: 9.5 mg/dL (ref 8.9–10.3)
Chloride: 87 mmol/L — ABNORMAL LOW (ref 98–111)
Creatinine, Ser: 1.95 mg/dL — ABNORMAL HIGH (ref 0.61–1.24)
GFR, Estimated: 34 mL/min — ABNORMAL LOW (ref >60)
Glucose, Bld: 301 mg/dL — ABNORMAL HIGH (ref 70–99)
Potassium: 4.2 mmol/L (ref 3.5–5.1)
Sodium: 128 mmol/L — ABNORMAL LOW (ref 135–145)

## 2022-06-16 LAB — CBC
HCT: 28.6 % — ABNORMAL LOW (ref 39.0–52.0)
Hemoglobin: 9.1 g/dL — ABNORMAL LOW (ref 13.0–17.0)
MCH: 26.2 pg (ref 26.0–34.0)
MCHC: 31.8 g/dL (ref 30.0–36.0)
MCV: 82.4 fL (ref 80.0–100.0)
Platelets: 291 K/uL (ref 150–400)
RBC: 3.47 MIL/uL — ABNORMAL LOW (ref 4.22–5.81)
RDW: 14.6 % (ref 11.5–15.5)
WBC: 9.8 K/uL (ref 4.0–10.5)
nRBC: 0 % (ref 0.0–0.2)

## 2022-06-16 LAB — GLUCOSE, CAPILLARY
Glucose-Capillary: 218 mg/dL — ABNORMAL HIGH (ref 70–99)
Glucose-Capillary: 306 mg/dL — ABNORMAL HIGH (ref 70–99)
Glucose-Capillary: 266 mg/dL — ABNORMAL HIGH (ref 70–99)
Glucose-Capillary: 231 mg/dL — ABNORMAL HIGH (ref 70–99)

## 2022-06-16 LAB — MAGNESIUM: Magnesium: 1.9 mg/dL (ref 1.7–2.4)

## 2022-06-16 LAB — VITAMIN B12: Vitamin B-12: 306 pg/mL (ref 180–914)

## 2022-06-16 MED ORDER — INSULIN ASPART 100 UNIT/ML IJ SOLN
8.0000 [IU] | Freq: Three times a day (TID) | INTRAMUSCULAR | Status: DC
  Administered 2022-06-16 – 2022-06-22 (×17): 8 [IU] via SUBCUTANEOUS
  Filled 2022-06-16 (×17): qty 1

## 2022-06-16 MED ORDER — CEPHALEXIN 500 MG PO CAPS
500.0000 mg | ORAL_CAPSULE | Freq: Three times a day (TID) | ORAL | Status: DC
  Administered 2022-06-16 – 2022-06-22 (×18): 500 mg via ORAL
  Filled 2022-06-16 (×18): qty 1

## 2022-06-16 MED ORDER — OXYCODONE-ACETAMINOPHEN 5-325 MG PO TABS
1.0000 | ORAL_TABLET | Freq: Four times a day (QID) | ORAL | Status: DC | PRN
  Administered 2022-06-17 – 2022-06-20 (×2): 1 via ORAL
  Filled 2022-06-16 (×2): qty 1

## 2022-06-16 MED ORDER — OXYCODONE-ACETAMINOPHEN 7.5-325 MG PO TABS: 1.0000 | ORAL_TABLET | ORAL | Status: DC | PRN

## 2022-06-16 MED ORDER — VITAMIN B-12 1000 MCG PO TABS
1000.0000 ug | ORAL_TABLET | Freq: Every day | ORAL | Status: DC
  Administered 2022-06-16 – 2022-06-22 (×7): 1000 ug via ORAL
  Filled 2022-06-16 (×7): qty 1

## 2022-06-16 MED ORDER — SODIUM CHLORIDE 1 G PO TABS
1.0000 g | ORAL_TABLET | Freq: Two times a day (BID) | ORAL | Status: DC
  Administered 2022-06-16 – 2022-06-22 (×13): 1 g via ORAL
  Filled 2022-06-16 (×13): qty 1

## 2022-06-16 NOTE — Progress Notes (Signed)
PT Cancellation Note  Patient Details Name: Justin Griffith MRN: [id number] DOB: 10-17-1940   Cancelled Treatment:     Pt Sleeping soundly upon arrival, PT to re-attempt as able.  Lieutenant Diego PT, DPT 3:08 PM,06/16/22

## 2022-06-16 NOTE — Progress Notes (Signed)
Progress Note   Patient: Justin Griffith [id number] DOB: 1940-09-04 DOA: 06/13/2022     2 DOS: the patient was seen and examined on 06/16/2022   Brief hospital course: ANEEL STEFANELLI is a 82 y.o. male with medical history significant of hypertension, hyperlipidemia, diabetes mellitus, COPD/asthma, stroke, depression, OSA, colon cancer, BPH with urinary retention with chronic indwelling catheter placement, PE on Eliquis, obesity, CAD, CABG, CKD-3B, dCHF, PAD, chronic indwelling Foley cath, who presents to ED due to Foley cath not draining.  He has some gross hematuria and Foley catheter was advanced.  Patient has been seen by urology, apparently patient had recurrent obstruction with Foley catheter with blood clots.  Anticoagulation was discontinued.  Patient is also seen by vascular surgery.  Not planning for IVC filter.  Patient has completed 6 months of anticoagulation for his a first episode of PE. Anticoagulation is on hold.  Hematuria is improving.  Patient was also started on treated with Rocephin, urine culture came back without growth, urology still want antibiotics due to obstruction.     Principal Problem:   Urinary retention Active Problems:   BPH with obstruction/lower urinary tract symptoms Foley dependent   Gross hematuria   UTI (urinary tract infection)   CAD (coronary artery disease)   Chronic obstructive pulmonary disease (COPD) (HCC)   Chronic diastolic CHF (congestive heart failure) (HCC)   Pulmonary embolism (HCC)   History of pulmonary embolism 09/18/21   HTN (hypertension)   HLD (hyperlipidemia)   Stage 3b chronic kidney disease (CKD) (HCC)   Uncontrolled type 2 diabetes mellitus with hyperglycemia, with long-term current use of insulin (HCC)   History of CVA (cerebrovascular accident)   Sleep apnea   Obesity (BMI 30-39.9)   Senile dementia with delirium ([place])   Pressure injury of skin   B12 deficiency anemia   Assessment and Plan: Urinary retention due to  gross hematuria and BPH with obstruction/lower urinary tract symptoms Foley dependent Possible urinary tract infection with hematuria. Patient condition was better, patient did well with voiding trial, Foley catheter was removed.  However, patient has severe penile pain afterwards.  Patient treated with pain medicine, pyridium.  Still has significant pain today, not able to discharge. But able to change antibiotic to oral.  Vitamin B12 deficient anemia. Previously, patient had lowl B12 level, current B12 level borderline.  Will restart B12 supplement.  Hyponatremia. Acute kidney injury on chronic kidney disease stage IIIb. Appear to have worsening renal function today, but bladder residual still 0.  No urinary retention.  Will start sodium chloride tablets for hyponatremia.  Follow BMP closely.  History of pulmonary embolism 09/18/21 Anticoagulation on hold due to active bleeding.  Per vascular surgery, patient was supposed to be taking Eliquis for 6 months, which has completed.  However, patient does have moderate to high risk for recurrent thrombosis.  Discussed with patient wife, will continue hold anticoagulation. Since Foley catheter is removed, may consider restart anticoagulation in a week or 2.   Dementia with delirium. Condition stable.   Essential hypertension Continue home medicine.   CAD (coronary artery disease):  -Continue Crestor   Chronic obstructive pulmonary disease (COPD) ([place]):  Stable.   Type II diabetes mellitus with renal manifestations ([place]):  Uncontrolled type 2 diabetes with hyperglycemia. Hemoglobin A1c 11.5 on 06/02/2022.  Glucose still elevated, continue Levemir at 20 units twice a day, increase scheduled NovoLog to 8 units 3 times a day, continue sliding scale insulin.   HLD (hyperlipidemia) - Crestor  Chronic diastolic CHF (congestive heart failure) ([place]): 2D echo on 09/20/2021 showed EF of 60 to 65%.   Patient euvolemic, no exacerbation.   Stage 3b  chronic kidney disease (CKD) ([place]): stable Hyponatremia. Stable.   History of CVA (cerebrovascular accident) -  Crestor   Obesity with body mass index (BMI) of 30.0 to 39.9: Body weight 113.1 kg, BMI 35.77  -Diet and exercise.        Subjective:  He is still complaining of severe penile pain,  Physical Exam: Vitals:   06/15/22 2226 06/15/22 2327 06/16/22 0500 06/16/22 0750  BP: 113/61 122/60  126/62  Pulse: 60 60  65  Resp:  20  17  Temp:  98.7 F (37.1 C)  98.1 F (36.7 C)  TempSrc:    Oral  SpO2:  98%  97%  Weight:   112.1 kg   Height:       General exam: Appears calm and comfortable  Respiratory system: Clear to auscultation. Respiratory effort normal. Cardiovascular system: S1 & S2 heard, RRR. No JVD, murmurs, rubs, gallops or clicks. No pedal edema. Gastrointestinal system: Abdomen is nondistended, soft and nontender. No organomegaly or masses felt. Normal bowel sounds heard. Central nervous system: Alert and oriented x2. No focal neurological deficits. Extremities: Symmetric 5 x 5 power. Skin: No rashes, lesions or ulcers Psychiatry: Judgement and insight appear normal. Mood & affect appropriate.    Data Reviewed:  Lab results reviewed.  Family Communication: None  Disposition: Status is: Inpatient Remains inpatient appropriate because: Severity of disease, pain control.     Time spent: 35 minutes  Author: Sharen Hones, MD 06/16/2022 12:51 PM  For on call review [email or web address].

## 2022-06-16 NOTE — Progress Notes (Signed)
Urology Inpatient Progress Note  Subjective: No acute events overnight.  He is afebrile, VSS. Creatinine up today, 2.08.  UOP 2075 mL for Foley removal yesterday. He continues to void spontaneously, bladder scan per nursing 0 mL this morning. He reports severe dysuria since catheter removal as well as bladder pain.  Anti-infectives: Anti-infectives (From admission, onward)    Start     Dose/Rate Route Frequency Ordered Stop   06/14/22 1600  cefTRIAXone (ROCEPHIN) 2 g in sodium chloride 0.9 % 100 mL IVPB        2 g 200 mL/hr over 30 Minutes Intravenous Every 24 hours 06/13/22 1621     06/13/22 1630  cefTRIAXone (ROCEPHIN) 1 g in sodium chloride 0.9 % 100 mL IVPB  Status:  Discontinued        1 g 200 mL/hr over 30 Minutes Intravenous Every 24 hours 06/13/22 1620 06/13/22 1621   06/13/22 1630  cefTRIAXone (ROCEPHIN) 1 g in sodium chloride 0.9 % 100 mL IVPB        1 g 200 mL/hr over 30 Minutes Intravenous  Once 06/13/22 1621 06/13/22 1701   06/13/22 1545  cefTRIAXone (ROCEPHIN) 1 g in sodium chloride 0.9 % 100 mL IVPB        1 g 200 mL/hr over 30 Minutes Intravenous  Once 06/13/22 1543 06/13/22 1615       Current Facility-Administered Medications  Medication Dose Route Frequency Provider Last Rate Last Admin   acetaminophen (TYLENOL) tablet 650 mg  650 mg Oral Q6H PRN Ivor Costa, MD       albuterol (PROVENTIL) (2.5 MG/3ML) 0.083% nebulizer solution 3 mL  3 mL Inhalation Q4H PRN Ivor Costa, MD       cefTRIAXone (ROCEPHIN) 2 g in sodium chloride 0.9 % 100 mL IVPB  2 g Intravenous Q24H Ivor Costa, MD 200 mL/hr at 06/15/22 1841 2 g at 06/15/22 1841   Chlorhexidine Gluconate Cloth 2 % PADS 6 each  6 each Topical Daily Sharen Hones, MD   6 each at 06/16/22 0908   cloNIDine (CATAPRES) tablet 0.1 mg  0.1 mg Oral BID Ivor Costa, MD   0.1 mg at 06/16/22 [id number]   dextromethorphan-guaiFENesin (MUCINEX DM) 30-600 MG per 12 hr tablet 1 tablet  1 tablet Oral BID PRN Ivor Costa, MD       gabapentin  (NEURONTIN) capsule 400 mg  400 mg Oral TID Ivor Costa, MD   400 mg at 06/16/22 [id number]   hydrALAZINE (APRESOLINE) injection 5 mg  5 mg Intravenous Q2H PRN Ivor Costa, MD       hydrALAZINE (APRESOLINE) tablet 100 mg  100 mg Oral TID Ivor Costa, MD   100 mg at 06/16/22 0904   HYDROmorphone (DILAUDID) injection 0.5 mg  0.5 mg Intravenous Q4H PRN Sharen Hones, MD   0.5 mg at 06/15/22 2237   insulin aspart (novoLOG) injection 0-5 Units  0-5 Units Subcutaneous QHS Ivor Costa, MD   2 Units at 06/15/22 2227   insulin aspart (novoLOG) injection 0-9 Units  0-9 Units Subcutaneous TID WC Ivor Costa, MD   7 Units at 06/16/22 0906   insulin aspart (novoLOG) injection 4 Units  4 Units Subcutaneous TID WC Sharen Hones, MD   4 Units at 06/16/22 0906   insulin detemir (LEVEMIR) injection 20 Units  20 Units Subcutaneous BID Sharen Hones, MD   20 Units at 06/16/22 0905   losartan (COZAAR) tablet 100 mg  100 mg Oral Daily Ivor Costa, MD   100 mg at 06/16/22  [id number]   multivitamin with minerals tablet 1 tablet  1 tablet Oral Daily Ivor Costa, MD   1 tablet at 06/16/22 [id number]   nystatin (MYCOSTATIN/NYSTOP) topical powder   Topical BID Ivor Costa, MD   Given at 06/16/22 0904   olopatadine (PATANOL) 0.1 % ophthalmic solution 1 drop  1 drop Both Eyes BID PRN Renda Rolls, RPH       ondansetron [facility]) injection 4 mg  4 mg Intravenous Q8H PRN Ivor Costa, MD       pantoprazole (PROTONIX) EC tablet 40 mg  40 mg Oral Daily Ivor Costa, MD   40 mg at 06/16/22 [id number]   phenazopyridine (PYRIDIUM) tablet 100 mg  100 mg Oral TID WC Sharen Hones, MD   100 mg at 06/16/22 [id number]   polyethylene glycol powder (GLYCOLAX/MIRALAX) container 17 g  17 g Oral BID PRN Ivor Costa, MD       rosuvastatin (CRESTOR) tablet 10 mg  10 mg Oral QHS Ivor Costa, MD   10 mg at 06/15/22 2227   sodium chloride tablet 1 g  1 g Oral BID WC Sharen Hones, MD   1 g at 06/16/22 0905   torsemide (DEMADEX) tablet 40 mg  40 mg Oral Q1400 Ivor Costa, MD   40 mg at 06/16/22  0904   umeclidinium bromide (INCRUSE ELLIPTA) 62.5 MCG/ACT 1 puff  1 puff Inhalation Daily Ivor Costa, MD   1 puff at 06/16/22 0908     Objective: Vital signs in last 24 hours: Temp:  [97.9 F (36.6 C)-98.7 F (37.1 C)] 98.1 F (36.7 C) (03/26 0750) Pulse Rate:  [60-78] 65 (03/26 0750) Resp:  [16-20] 17 (03/26 0750) BP: (113-131)/(60-68) 126/62 (03/26 0750) SpO2:  [92 %-100 %] 98 % (03/25 2327) Weight:  [112.1 kg] 112.1 kg (03/26 0500)  Intake/Output from previous day: 03/25 0701 - 03/26 0700 In: 1090 [P.O.:1090] Out: 2075 [Urine:2075] Intake/Output this shift: Total I/O In: -  Out: 150 [Urine:150]  Physical Exam Vitals and nursing note reviewed.  HENT:     Head: Normocephalic and atraumatic.  Pulmonary:     Effort: Pulmonary effort is normal. No respiratory distress.  Skin:    General: Skin is warm and dry.  Neurological:     Mental Status: He is oriented to person, place, and time.  Psychiatric:        Mood and Affect: Mood normal.        Behavior: Behavior normal.     Lab Results:  Recent Labs    06/15/22 0851 06/16/22 0624  WBC 6.6 9.8  HGB 8.6* 9.1*  HCT 27.6* 28.6*  PLT 291 291   BMET Recent Labs    06/14/22 0415 06/16/22 0624  NA 132* 126*  K 3.8 3.9  CL 101 93*  CO2 26 25  GLUCOSE 293* 249*  BUN 32* 45*  CREATININE 1.66* 2.08*  CALCIUM 9.2 8.8*   PT/INR Recent Labs    06/13/22 1752  LABPROT 15.5*  INR 1.2   Assessment & Plan: 82 year old male with PMH BPH with urinary retention managed with indwelling Foley s/p PAE 1 month ago, admitted with hematuria and clot catheter occlusion due to malpositioned Foley balloon.    He has now passed an inpatient voiding trial and is voiding spontaneously with appropriate PVR.  Recommend a total of 5 to 7 days of antibiotics to sterilize the urine in the setting of chronic Foley and catheter manipulation.  Anticipate his dysuria is due to duration of catheterization as well  as a possible element of  post PAE symptoms and should be time-limited.  Agree with supportive care and Pyridium as needed for symptom control.  I have scheduled him for outpatient follow-up with Dr. Diamantina Providence in 4 weeks.  Okay for discharge from the urologic perspective.  Debroah Loop, PA-C 06/16/2022

## 2022-06-16 NOTE — Evaluation (Signed)
Occupational Therapy Evaluation Patient Details Name: Justin Griffith MRN: [id number] DOB: 24-May-1940 Today's Date: 06/16/2022   History of Present Illness Pt is an 82 yo male with PMH of dementia, HTN, HLD, COPD, DM, PE, chronic indwelling catheter. Admitted for  hematuria.   Clinical Impression   Patient received supine in bed and agreeable to OT evaluation with encouragement. Pt presenting with decreased independence in self care, balance, functional mobility/transfers,and endurance. PTA pt lived with spouse, received assistance for ADLs/IADLs as needed, and was Mod I for functional mobility using a RW. Pt currently functioning at Max A for bed mobility, Min A for simulated toilet transfer, and Min A to take several steps along EOB using RW. He required set up A for use of urinal, Min A for UB dressing, and Max A for LB dressing. Pt's activity tolerance limited by back and sacral pain this date (RN notified). Pt will benefit from acute OT to increase overall independence in the areas of ADLs and functional mobility. OT recommends ongoing therapy upon discharge to maximize safety and independence with ADLs, decrease fall risk, decrease caregiver burden, and promote return to PLOF.      Recommendations for follow up therapy are one component of a multi-disciplinary discharge planning process, led by the attending physician.  Recommendations may be updated based on patient status, additional functional criteria and insurance authorization.   Assistance Recommended at Discharge Frequent or constant Supervision/Assistance  Patient can return home with the following Assistance with cooking/housework;Assist for transportation;Help with stairs or ramp for entrance;A little help with walking and/or transfers;A lot of help with bathing/dressing/bathroom    Functional Status Assessment  Patient has had a recent decline in their functional status and demonstrates the ability to make significant improvements  in function in a reasonable and predictable amount of time.  Equipment Recommendations  None recommended by OT    Recommendations for Other Services       Precautions / Restrictions Precautions Precautions: Fall Restrictions Weight Bearing Restrictions: No      Mobility Bed Mobility Overal bed mobility: Needs Assistance Bed Mobility: Supine to Sit, Sit to Supine     Supine to sit: Max assist Sit to supine: Max assist   General bed mobility comments: limited by back pain, normally sleeps in recliner at home    Transfers Overall transfer level: Needs assistance Equipment used: Rolling walker (2 wheels) Transfers: Sit to/from Stand Sit to Stand: Min assist           General transfer comment: STS from slightly elevated EOB      Balance Overall balance assessment: Needs assistance Sitting-balance support: Feet supported Sitting balance-Leahy Scale: Good     Standing balance support: Bilateral upper extremity supported, During functional activity Standing balance-Leahy Scale: Fair         ADL either performed or assessed with clinical judgement   ADL Overall ADL's : Needs assistance/impaired     Grooming: Set up;Supervision/safety;Sitting           Upper Body Dressing : Minimal assistance;Sitting Upper Body Dressing Details (indicate cue type and reason): to don/doff gown  Lower Body Dressing: Maximal assistance;Sitting/lateral leans Lower Body Dressing Details (indicate cue type and reason): socks  Toilet Transfer: Rolling walker (2 wheels);Minimal assistance Toilet Transfer Details (indicate cue type and reason): simulated         Functional mobility during ADLs: Cueing for sequencing;Rolling walker (2 wheels);Cueing for safety;Minimal assistance (~4 lateral steps along EOB, assist for RW management, pt impulsively sat down  onto EOB due to "weakness") General ADL Comments: Pt received on urinal, chuck pad and gown saturated with urine. Pt  requested urinal again at end of session, set up A provided.     Vision Patient Visual Report: No change from baseline       Perception     Praxis      Pertinent Vitals/Pain Pain Assessment Pain Assessment: Faces Faces Pain Scale: Hurts little more Pain Location: back and buttocks with movement Pain Descriptors / Indicators: Sore, Grimacing Pain Intervention(s): Limited activity within patient's tolerance, Monitored during session, Repositioned, Patient requesting pain meds-RN notified     Hand Dominance Right   Extremity/Trunk Assessment Upper Extremity Assessment Upper Extremity Assessment: Generalized weakness   Lower Extremity Assessment Lower Extremity Assessment: Generalized weakness   Cervical / Trunk Assessment Cervical / Trunk Assessment: Normal   Communication Communication Communication: HOH (hearing aids not charged)   Cognition Arousal/Alertness: Awake/alert Behavior During Therapy: Flat affect Overall Cognitive Status: History of cognitive impairments - at baseline               General Comments: Pt required encouragement to participate. VC for overall safety awareness     General Comments       Exercises Other Exercises Other Exercises: OT provided education re: role of OT, OT POC, post acute recs, sitting up for all meals, EOB/OOB mobility with assistance, home/fall safety.     Shoulder Instructions      Home Living Family/patient expects to be discharged to:: Private residence Living Arrangements: Spouse/significant other Available Help at Discharge: Family Type of Home: House Home Access: Stairs to enter Technical brewer of Steps: 1 Entrance Stairs-Rails: None Home Layout: One level     Bathroom Shower/Tub: Occupational psychologist: [place]: Conservation officer, nature (2 wheels);Cane - single point;BSC/3in1          Prior Functioning/Environment Prior Level of Function : Needs assist        Physical Assist : Mobility (physical);ADLs (physical) Mobility (physical): Transfers;Stairs ADLs (physical): Bathing;Dressing;IADLs Mobility Comments: uses RW only, wife assists with one step into home. occasionally needs help to stand ADLs Comments: wife assists with ADLs/IADLs as needed        OT Problem List: Decreased strength;Decreased activity tolerance;Impaired balance (sitting and/or standing);Decreased safety awareness;Pain      OT Treatment/Interventions: Self-care/ADL training;Therapeutic exercise;Neuromuscular education;Energy conservation;DME and/or AE instruction;Manual therapy;Modalities;Balance training;Patient/family education;Visual/perceptual remediation/compensation;Cognitive remediation/compensation;Therapeutic activities;Splinting    OT Goals(Current goals can be found in the care plan section) Acute Rehab OT Goals Patient Stated Goal: return home OT Goal Formulation: With patient Time For Goal Achievement: 06/30/22 Potential to Achieve Goals: Good   OT Frequency: Min 2X/week    Co-evaluation              AM-PAC OT "6 Clicks" Daily Activity     Outcome Measure Help from another person eating meals?: None Help from another person taking care of personal grooming?: A Little Help from another person toileting, which includes using toliet, bedpan, or urinal?: A Lot Help from another person bathing (including washing, rinsing, drying)?: A Lot Help from another person to put on and taking off regular upper body clothing?: A Little Help from another person to put on and taking off regular lower body clothing?: A Lot 6 Click Score: 16   End of Session Equipment Utilized During Treatment: Gait belt;Rolling walker (2 wheels) Nurse Communication: Mobility status;Patient requests pain meds  Activity Tolerance: Patient tolerated treatment well;Patient limited  by pain Patient left: in bed;with call bell/phone within reach;with bed alarm set  OT Visit Diagnosis:  Unsteadiness on feet (R26.81);Muscle weakness (generalized) (M62.81);History of falling (Z91.81);Pain Pain - part of body:  (back and buttocks)                Time: [id number] OT Time Calculation (min): 26 min Charges:  OT General Charges $OT Visit: 1 Visit OT Evaluation $OT Eval Low Complexity: 1 Low  [facility] MS, OTR/L ascom [phone]  06/16/22, 2:11 PM

## 2022-06-16 NOTE — Inpatient Diabetes Management (Signed)
Inpatient Diabetes Program Recommendations  AACE/ADA: New Consensus Statement on Inpatient Glycemic Control (2015)  Target Ranges:  Prepandial:   less than 140 mg/dL      Peak postprandial:   less than 180 mg/dL (1-2 hours)      Critically ill patients:  140 - 180 mg/dL    Latest Reference Range & Units 06/15/22 07:40 06/15/22 11:59 06/15/22 17:04 06/15/22 21:34 06/16/22 07:40  Glucose-Capillary 70 - 99 mg/dL 337 (H) 281 (H) 271 (H) 248 (H) 218 (H)    Latest Reference Range & Units 02/04/22 14:13 06/02/22 14:30  HB A1C (BAYER DCA - WAIVED) 4.8 - 5.6 % 6.3 (H) 11.5 (H)   Review of Glycemic Control  Diabetes history: DM2 Outpatient Diabetes medications: Metformin 500 mg BID, Rybelsus 14 mg daily Current orders for Inpatient glycemic control: Levemir 20 units BID, Novolog 4 units TID with meals, Novolog 0-9 units TID with meals, Novolog 0-5 units QHS   Inpatient Diabetes Program Recommendations:     Insulin:Please consider increasing meal coverage to Novolog 8 units TID with meals.   Outpatient: Patient has taken Semglee and Novolog in the past (stopped on 02/10/22 per PCP note). A1C has increased to 11.5% on 06/02/22 (was 6.3% on 02/04/22).  May need to consider discharging on insulin.   Thanks, Barnie Alderman, RN, MSN, [place] Diabetes Coordinator Inpatient Diabetes Program [phone] (Team Pager from 8am to [place])

## 2022-06-16 NOTE — Plan of Care (Signed)

## 2022-06-17 ENCOUNTER — Inpatient Hospital Stay: Payer: PPO

## 2022-06-17 DIAGNOSIS — R339 Retention of urine, unspecified: Secondary | ICD-10-CM | POA: Diagnosis not present

## 2022-06-17 DIAGNOSIS — N1832 Chronic kidney disease, stage 3b: Secondary | ICD-10-CM | POA: Diagnosis not present

## 2022-06-17 DIAGNOSIS — T839XXA Unspecified complication of genitourinary prosthetic device, implant and graft, initial encounter: Secondary | ICD-10-CM

## 2022-06-17 DIAGNOSIS — E1165 Type 2 diabetes mellitus with hyperglycemia: Secondary | ICD-10-CM | POA: Diagnosis not present

## 2022-06-17 DIAGNOSIS — N178 Other acute kidney failure: Secondary | ICD-10-CM | POA: Diagnosis not present

## 2022-06-17 LAB — CBC
HCT: 27.3 % — ABNORMAL LOW (ref 39.0–52.0)
Hemoglobin: 8.7 g/dL — ABNORMAL LOW (ref 13.0–17.0)
MCH: 26.4 pg (ref 26.0–34.0)
MCHC: 31.9 g/dL (ref 30.0–36.0)
MCV: 82.7 fL (ref 80.0–100.0)
Platelets: 256 10*3/uL (ref 150–400)
RBC: 3.3 MIL/uL — ABNORMAL LOW (ref 4.22–5.81)
RDW: 14.6 % (ref 11.5–15.5)
WBC: 8.5 10*3/uL (ref 4.0–10.5)
nRBC: 0.2 % (ref 0.0–0.2)

## 2022-06-17 LAB — GLUCOSE, CAPILLARY
Glucose-Capillary: 221 mg/dL — ABNORMAL HIGH (ref 70–99)
Glucose-Capillary: 218 mg/dL — ABNORMAL HIGH (ref 70–99)
Glucose-Capillary: 222 mg/dL — ABNORMAL HIGH (ref 70–99)
Glucose-Capillary: 316 mg/dL — ABNORMAL HIGH (ref 70–99)

## 2022-06-17 LAB — BASIC METABOLIC PANEL
Anion gap: 9 (ref 5–15)
BUN: 58 mg/dL — ABNORMAL HIGH (ref 8–23)
CO2: 25 mmol/L (ref 22–32)
Calcium: 8.9 mg/dL (ref 8.9–10.3)
Chloride: 93 mmol/L — ABNORMAL LOW (ref 98–111)
Creatinine, Ser: 2.13 mg/dL — ABNORMAL HIGH (ref 0.61–1.24)
GFR, Estimated: 30 mL/min — ABNORMAL LOW (ref >60)
Glucose, Bld: 236 mg/dL — ABNORMAL HIGH (ref 70–99)
Potassium: 3.7 mmol/L (ref 3.5–5.1)
Sodium: 127 mmol/L — ABNORMAL LOW (ref 135–145)

## 2022-06-17 MED ORDER — SODIUM CHLORIDE 0.9 % IV SOLN: INTRAVENOUS | Status: AC

## 2022-06-17 NOTE — Progress Notes (Signed)
Progress Note   Patient: Justin Griffith [id number] DOB: Jan 12, 1941 DOA: 06/13/2022     3 DOS: the patient was seen and examined on 06/17/2022   Brief hospital course: Justin Griffith is a 82 y.o. male with medical history significant of hypertension, hyperlipidemia, diabetes mellitus, COPD/asthma, stroke, depression, OSA, colon cancer, BPH with urinary retention with chronic indwelling catheter placement, PE on Eliquis, obesity, CAD, CABG, CKD-3B, dCHF, PAD, chronic indwelling Foley cath, who presents to ED due to Foley cath not draining.  He has some gross hematuria and Foley catheter was advanced.  Patient has been seen by urology, apparently patient had recurrent obstruction with Foley catheter with blood clots.  Anticoagulation was discontinued.  Patient is also seen by vascular surgery.  Not planning for IVC filter.  Patient has completed 6 months of anticoagulation for his a first episode of PE. Anticoagulation is on hold.  Hematuria is improving.  Patient was also started on treated with Rocephin, urine culture came back without growth, urology still want antibiotics due to obstruction.     Principal Problem:   Urinary retention Active Problems:   BPH with obstruction/lower urinary tract symptoms Foley dependent   Gross hematuria   UTI (urinary tract infection)   Acute renal failure superimposed on stage 3b chronic kidney disease (HCC)   CAD (coronary artery disease)   Chronic obstructive pulmonary disease (COPD) (HCC)   Chronic diastolic CHF (congestive heart failure) (HCC)   Pulmonary embolism (HCC)   History of pulmonary embolism 09/18/21   HTN (hypertension)   HLD (hyperlipidemia)   Stage 3b chronic kidney disease (CKD) (HCC)   Uncontrolled type 2 diabetes mellitus with hyperglycemia, with long-term current use of insulin (HCC)   History of CVA (cerebrovascular accident)   Sleep apnea   Obesity (BMI 30-39.9)   Senile dementia with delirium ([place])   Pressure injury of skin    B12 deficiency anemia   Assessment and Plan: Urinary retention due to gross hematuria and BPH with obstruction/lower urinary tract symptoms Foley dependent Possible urinary tract infection with hematuria. Patient condition was better, patient did well with voiding trial, Foley catheter was removed.  However, patient has severe penile pain afterwards.  Patient treated with pain medicine, pyridium.  Pain is better today.  Pyridium discontinued due to worsening renal function.   Vitamin B12 deficient anemia. Previously, patient had lowl B12 level, current B12 level borderline.  Restarted B12 supplement.   Hyponatremia. Acute kidney injury on chronic kidney disease stage IIIb. Renal function is worsened today, renal ultrasound did not show any obstruction, showed medical kidney disease.  Continue salt tablets for hyponatremia.  Also start IV fluids.   History of pulmonary embolism 09/18/21 Anticoagulation on hold due to active bleeding.  Per vascular surgery, patient was supposed to be taking Eliquis for 6 months, which has completed.  However, patient does have moderate to high risk for recurrent thrombosis.  Discussed with patient wife, will continue hold anticoagulation. Since Foley catheter is removed, may consider restart anticoagulation in a week or 2.   Dementia with delirium. Condition stable.   Essential hypertension Continue home medicine.   CAD (coronary artery disease):  -Continue Crestor   Chronic obstructive pulmonary disease (COPD) ([place]):  Stable.   Type II diabetes mellitus with renal manifestations ([place]):  Uncontrolled type 2 diabetes with hyperglycemia. Hemoglobin A1c 11.5 on 06/02/2022.  Insulin dose increased yesterday, continue to follow for today.   HLD (hyperlipidemia) - Crestor    Chronic diastolic CHF (congestive heart failure) (  [place]): 2D echo on 09/20/2021 showed EF of 60 to 65%.   Patient euvolemic, no exacerbation. Gentle rehydration, hold off torsemide.    Stage 3b chronic kidney disease (CKD) ([place]): stable Hyponatremia. Stable.   History of CVA (cerebrovascular accident) -  Crestor   Obesity with body mass index (BMI) of 30.0 to 39.9: Body weight 113.1 kg, BMI 35.77  -Diet and exercise.           Subjective:  Patient still has some penile pain but seem to get better today  Physical Exam: Vitals:   06/16/22 2330 06/17/22 0500 06/17/22 0758 06/17/22 1040  BP: (!) 103/51  113/62 (!) 151/64  Pulse: 66  60 68  Resp: 20  17   Temp: 98 F (36.7 C)  97.6 F (36.4 C)   TempSrc:      SpO2: 98%  98%   Weight:  113.9 kg    Height:       General exam: Appears calm and comfortable  Respiratory system: Clear to auscultation. Respiratory effort normal. Cardiovascular system: S1 & S2 heard, RRR. No JVD, murmurs, rubs, gallops or clicks. No pedal edema. Gastrointestinal system: Abdomen is nondistended, soft and nontender. No organomegaly or masses felt. Normal bowel sounds heard. Central nervous system: Alert and oriented. No focal neurological deficits. Extremities: Symmetric 5 x 5 power. Skin: No rashes, lesions or ulcers Psychiatry: Judgement and insight appear normal. Mood & affect appropriate.    Data Reviewed:  Lab results reviewed  Family Communication: None  Disposition: Status is: Inpatient Remains inpatient appropriate because: Severity of disease, IV treatment.     Time spent: 35 minutes  Author: Sharen Hones, MD 06/17/2022 12:00 PM  For on call review [email or web address].

## 2022-06-17 NOTE — Progress Notes (Signed)
Urology Consult Follow Up  Subjective: No acute events overnight.    He is afebrile, VSS.    His creatinine is up to 2.13.    He reports severe dysuria since his catheter was removed.  Purewick in place with 400 cc of orange colored urine in the collection cup.    Bladder scans have demonstrated adequate bladder emptying.  UOP with 1000 cc this am.   Anti-infectives: Anti-infectives (From admission, onward)    Start     Dose/Rate Route Frequency Ordered Stop   06/16/22 1400  cephALEXin (KEFLEX) capsule 500 mg        500 mg Oral Every 8 hours 06/16/22 1250     06/14/22 1600  cefTRIAXone (ROCEPHIN) 2 g in sodium chloride 0.9 % 100 mL IVPB  Status:  Discontinued        2 g 200 mL/hr over 30 Minutes Intravenous Every 24 hours 06/13/22 1621 06/16/22 1250   06/13/22 1630  cefTRIAXone (ROCEPHIN) 1 g in sodium chloride 0.9 % 100 mL IVPB  Status:  Discontinued        1 g 200 mL/hr over 30 Minutes Intravenous Every 24 hours 06/13/22 1620 06/13/22 1621   06/13/22 1630  cefTRIAXone (ROCEPHIN) 1 g in sodium chloride 0.9 % 100 mL IVPB        1 g 200 mL/hr over 30 Minutes Intravenous  Once 06/13/22 1621 06/13/22 1701   06/13/22 1545  cefTRIAXone (ROCEPHIN) 1 g in sodium chloride 0.9 % 100 mL IVPB        1 g 200 mL/hr over 30 Minutes Intravenous  Once 06/13/22 1543 06/13/22 1615       Current Facility-Administered Medications  Medication Dose Route Frequency Provider Last Rate Last Admin   acetaminophen (TYLENOL) tablet 650 mg  650 mg Oral Q6H PRN Ivor Costa, MD       albuterol (PROVENTIL) (2.5 MG/3ML) 0.083% nebulizer solution 3 mL  3 mL Inhalation Q4H PRN Ivor Costa, MD       cephALEXin (KEFLEX) capsule 500 mg  500 mg Oral Q8H Sharen Hones, MD   500 mg at 06/17/22 [id number]   Chlorhexidine Gluconate Cloth 2 % PADS 6 each  6 each Topical Daily Sharen Hones, MD   6 each at 06/16/22 0908   cloNIDine (CATAPRES) tablet 0.1 mg  0.1 mg Oral BID Ivor Costa, MD   0.1 mg at 06/16/22 [id number]    cyanocobalamin (VITAMIN B12) tablet 1,000 mcg  1,000 mcg Oral Daily Sharen Hones, MD   1,000 mcg at 06/16/22 1355   dextromethorphan-guaiFENesin (MUCINEX DM) 30-600 MG per 12 hr tablet 1 tablet  1 tablet Oral BID PRN Ivor Costa, MD       gabapentin (NEURONTIN) capsule 400 mg  400 mg Oral TID Ivor Costa, MD   400 mg at 06/16/22 2205   hydrALAZINE (APRESOLINE) injection 5 mg  5 mg Intravenous Q2H PRN Ivor Costa, MD       hydrALAZINE (APRESOLINE) tablet 100 mg  100 mg Oral TID Ivor Costa, MD   100 mg at 06/16/22 1558   insulin aspart (novoLOG) injection 0-5 Units  0-5 Units Subcutaneous QHS Ivor Costa, MD   2 Units at 06/16/22 2204   insulin aspart (novoLOG) injection 0-9 Units  0-9 Units Subcutaneous TID WC Ivor Costa, MD   5 Units at 06/16/22 1724   insulin aspart (novoLOG) injection 8 Units  8 Units Subcutaneous TID WC Sharen Hones, MD   8 Units at 06/16/22 1723   insulin detemir (  LEVEMIR) injection 20 Units  20 Units Subcutaneous BID Sharen Hones, MD   20 Units at 06/16/22 2203   losartan (COZAAR) tablet 100 mg  100 mg Oral Daily Ivor Costa, MD   100 mg at 06/16/22 [id number]   multivitamin with minerals tablet 1 tablet  1 tablet Oral Daily Ivor Costa, MD   1 tablet at 06/16/22 [id number]   nystatin (MYCOSTATIN/NYSTOP) topical powder   Topical BID Ivor Costa, MD   Given at 06/16/22 2205   olopatadine (PATANOL) 0.1 % ophthalmic solution 1 drop  1 drop Both Eyes BID PRN Renda Rolls, RPH       ondansetron (ZOFRAN) injection 4 mg  4 mg Intravenous Q8H PRN Ivor Costa, MD       oxyCODONE-acetaminophen (PERCOCET/ROXICET) 5-325 MG per tablet 1 tablet  1 tablet Oral Q6H PRN Sharen Hones, MD       pantoprazole (PROTONIX) EC tablet 40 mg  40 mg Oral Daily Ivor Costa, MD   40 mg at 06/16/22 [id number]   polyethylene glycol powder (GLYCOLAX/MIRALAX) container 17 g  17 g Oral BID PRN Ivor Costa, MD       rosuvastatin (CRESTOR) tablet 10 mg  10 mg Oral QHS Ivor Costa, MD   10 mg at 06/16/22 2205   sodium chloride tablet 1 g   1 g Oral BID WC Sharen Hones, MD   1 g at 06/16/22 1558   torsemide (DEMADEX) tablet 40 mg  40 mg Oral Q1400 Ivor Costa, MD   40 mg at 06/16/22 0904   umeclidinium bromide (INCRUSE ELLIPTA) 62.5 MCG/ACT 1 puff  1 puff Inhalation Daily Ivor Costa, MD   1 puff at 06/16/22 0908     Objective: Vital signs in last 24 hours: Temp:  [97.7 F (36.5 C)-98.3 F (36.8 C)] 98 F (36.7 C) (03/26 2330) Pulse Rate:  [62-78] 66 (03/26 2330) Resp:  [17-20] 20 (03/26 2330) BP: (103-126)/(50-62) 103/51 (03/26 2330) SpO2:  [92 %-98 %] 98 % (03/26 2330) Weight:  [113.9 kg] 113.9 kg (03/27 0500)  Intake/Output from previous day: 03/26 0701 - 03/27 0700 In: 480 [P.O.:480] Out: 650 [Urine:650] Intake/Output this shift: No intake/output data recorded.   Physical Exam Constitutional:      General: He is not in acute distress.    Appearance: Normal appearance. He is obese. He is not ill-appearing, toxic-appearing or diaphoretic.  HENT:     Head: Normocephalic and atraumatic.     Nose: Nose normal.     Mouth/Throat:     Mouth: Mucous membranes are moist.  Eyes:     Extraocular Movements: Extraocular movements intact.     Conjunctiva/sclera: Conjunctivae normal.     Pupils: Pupils are equal, round, and reactive to light.  Pulmonary:     Effort: Pulmonary effort is normal.  Abdominal:     General: Abdomen is flat.     Palpations: Abdomen is soft.  Genitourinary:    Penis: Normal.      Comments: Patient with an uncircumcised phallus.  Foreskin is easily retractable.  There is no penile edema, erythema or swelling.  The glans has no rash or erythema.  Meatus is open without scarring or erythema. Musculoskeletal:     Cervical back: Normal range of motion.  Skin:    General: Skin is warm and dry.  Neurological:     General: No focal deficit present.     Mental Status: He is alert and oriented to person, place, and time.  Psychiatric:  Mood and Affect: Mood normal.        Behavior:  Behavior normal.        Thought Content: Thought content normal.        Judgment: Judgment normal.    Lab Results:  Recent Labs    06/16/22 0624 06/17/22 0614  WBC 9.8 8.5  HGB 9.1* 8.7*  HCT 28.6* 27.3*  PLT 291 256   BMET Recent Labs    06/16/22 1106 06/17/22 0614  NA 128* 127*  K 4.2 3.7  CL 87* 93*  CO2 26 25  GLUCOSE 301* 236*  BUN 50* 58*  CREATININE 1.95* 2.13*  CALCIUM 9.5 8.9   PT/INR No results for input(s): "LABPROT", "INR" in the last 72 hours. ABG No results for input(s): "PHART", "HCO3" in the last 72 hours.  Invalid input(s): "PCO2", "PO2"  Studies/Results: No results found.   Assessment and Plan: 82 year old male with a past medical history of benign prostatic hyperplasia with urinary retention who was managed with indwelling Foley for several months prior to op prostate artery embolism that was performed 1 month ago who was admitted with gross hematuria and clot occlusion of his catheter due to a malpositioned Foley balloon.  He continues to void spontaneously without the need of a Foley catheter.  Continue with a total of 5 to 7 days of antibiotics to sterilize the urine in the setting of chronic Foley and catheter manipulation.    Dysuria is likely due to the long duration of the catheter prior and post PA symptoms and hopefully will be self-limiting.  Continue supportive care and Pyridium as needed for symptom control.  Okay for discharge from urological perspective  He has an outpatient follow-up with Dr. Diamantina Providence on April 24.       LOS: 3 days    [facility] 06/17/2022

## 2022-06-17 NOTE — TOC Progression Note (Signed)
Transition of Care [facility]) - Progression Note    Patient Details  Name: Justin Griffith MRN: [id number] Date of Birth: [birth year]  [facility]) CM/SW [place], RN Phone Number: 06/17/2022, 12:35 PM  Clinical Narrative:     TOC continues to follow the patient for needs. Plan remains to go home with [facility]  Expected Discharge Plan: [place] Barriers to Discharge: No Barriers Identified  Expected Discharge Plan and Services   Discharge Planning Services: CM Consult   Living arrangements for the past 2 months: Single Family Home                 DME Arranged: N/A DME Agency: NA                   Social Determinants of Health (SDOH) Interventions SDOH Screenings   Food Insecurity: No Food Insecurity (06/13/2022)  Housing: Low Risk  (06/13/2022)  Transportation Needs: No Transportation Needs (06/13/2022)  Utilities: Not At Risk (06/13/2022)  Alcohol Screen: Low Risk  (03/03/2022)  Depression (PHQ2-9): High Risk (04/03/2022)  Financial Resource Strain: Low Risk  (03/03/2022)  Physical Activity: Inactive (03/03/2022)  Social Connections: Moderately Integrated (03/03/2022)  Stress: No Stress Concern Present (03/03/2022)  Tobacco Use: Medium Risk (06/13/2022)    Readmission Risk Interventions    11/24/2021   10:33 AM 09/21/2021   10:42 AM 05/21/2020   10:01 AM  Readmission Risk Prevention Plan  Transportation Screening Complete Complete Complete  PCP or Specialist Appt within 3-5 Days  Complete Complete  HRI or Home Care Consult  Complete Complete  Social Work Consult for [place] Planning/Counseling  Complete Complete  Palliative Care Screening  Not Applicable Not Applicable  Medication Review Press photographer) Complete Complete Complete  PCP or Specialist appointment within 3-5 days of discharge Complete    HRI or Home Care Consult Complete    SW Recovery Care/Counseling Consult Complete    Palliative Care Screening Not  [place] Complete

## 2022-06-17 NOTE — Care Management Important Message (Signed)
Important Message  Patient Details  Name: ARLIE BRENDEL MRN: [id number] Date of Birth: 05/21/1940   Medicare Important Message Given:  N/A - LOS <3 / Initial given by admissions     Dannette Barbara 06/17/2022, 11:59 AM

## 2022-06-17 NOTE — Progress Notes (Signed)
PT Cancellation Note  Patient Details Name: Justin Griffith MRN: [id number] DOB: 02-02-1941   Cancelled Treatment:    Reason Eval/Treat Not Completed: Patient declined, no reason specified when asked, " I dont't want to". Attempted to encourage pt be reminding him of what he did last time with PT.  PT will continue to follow pt tomorrow.   Bjorn Loser, PTA  06/17/22, 11:19 AM

## 2022-06-17 NOTE — Plan of Care (Signed)

## 2022-06-17 NOTE — Inpatient Diabetes Management (Signed)
Inpatient Diabetes Program Recommendations  AACE/ADA: New Consensus Statement on Inpatient Glycemic Control   Target Ranges:  Prepandial:   less than 140 mg/dL      Peak postprandial:   less than 180 mg/dL (1-2 hours)      Critically ill patients:  140 - 180 mg/dL    Latest Reference Range & Units 06/16/22 07:40 06/16/22 11:45 06/16/22 15:54 06/16/22 21:35  Glucose-Capillary 70 - 99 mg/dL 218 (H) 306 (H) 266 (H) 231 (H)   Review of Glycemic Control  Diabetes history: DM2 Outpatient Diabetes medications: Metformin 500 mg BID, Rybelsus 14 mg daily Current orders for Inpatient glycemic control: Levemir 20 units BID, Novolog 8 units TID with meals, Novolog 0-9 units TID with meals, Novolog 0-5 units QHS  Inpatient Diabetes Program Recommendations:    Insulin: Please consider increasing Levemir to 24 units BID.  Outpatient: Patient has taken Semglee and Novolog in the past (stopped on 02/10/22 per PCP note). A1C has increased to 11.5% on 06/02/22 (was 6.3% on 02/04/22).  Anticipate patient will need to be discharged on insulin.   Thanks, Barnie Alderman, RN, MSN, [place] Diabetes Coordinator Inpatient Diabetes Program [phone] (Team Pager from 8am to [place])

## 2022-06-18 DIAGNOSIS — N138 Other obstructive and reflux uropathy: Secondary | ICD-10-CM | POA: Diagnosis not present

## 2022-06-18 DIAGNOSIS — I5032 Chronic diastolic (congestive) heart failure: Secondary | ICD-10-CM | POA: Diagnosis not present

## 2022-06-18 DIAGNOSIS — N178 Other acute kidney failure: Secondary | ICD-10-CM | POA: Diagnosis not present

## 2022-06-18 DIAGNOSIS — N401 Enlarged prostate with lower urinary tract symptoms: Secondary | ICD-10-CM | POA: Diagnosis not present

## 2022-06-18 LAB — BASIC METABOLIC PANEL
Anion gap: 9 (ref 5–15)
BUN: 66 mg/dL — ABNORMAL HIGH (ref 8–23)
CO2: 28 mmol/L (ref 22–32)
Calcium: 8.8 mg/dL — ABNORMAL LOW (ref 8.9–10.3)
Chloride: 95 mmol/L — ABNORMAL LOW (ref 98–111)
Creatinine, Ser: 2.05 mg/dL — ABNORMAL HIGH (ref 0.61–1.24)
GFR, Estimated: 32 mL/min — ABNORMAL LOW (ref >60)
Glucose, Bld: 302 mg/dL — ABNORMAL HIGH (ref 70–99)
Potassium: 4.3 mmol/L (ref 3.5–5.1)
Sodium: 132 mmol/L — ABNORMAL LOW (ref 135–145)

## 2022-06-18 LAB — CBC
HCT: 26 % — ABNORMAL LOW (ref 39.0–52.0)
Hemoglobin: 8 g/dL — ABNORMAL LOW (ref 13.0–17.0)
MCH: 26.1 pg (ref 26.0–34.0)
MCHC: 30.8 g/dL (ref 30.0–36.0)
MCV: 84.7 fL (ref 80.0–100.0)
Platelets: 258 10*3/uL (ref 150–400)
RBC: 3.07 MIL/uL — ABNORMAL LOW (ref 4.22–5.81)
RDW: 14.5 % (ref 11.5–15.5)
WBC: 7.2 10*3/uL (ref 4.0–10.5)
nRBC: 0 % (ref 0.0–0.2)

## 2022-06-18 LAB — GLUCOSE, CAPILLARY
Glucose-Capillary: 280 mg/dL — ABNORMAL HIGH (ref 70–99)
Glucose-Capillary: 220 mg/dL — ABNORMAL HIGH (ref 70–99)
Glucose-Capillary: 150 mg/dL — ABNORMAL HIGH (ref 70–99)
Glucose-Capillary: 166 mg/dL — ABNORMAL HIGH (ref 70–99)

## 2022-06-18 LAB — MAGNESIUM: Magnesium: 2.3 mg/dL (ref 1.7–2.4)

## 2022-06-18 MED ORDER — SIMETHICONE 80 MG PO CHEW
160.0000 mg | CHEWABLE_TABLET | Freq: Once | ORAL | Status: DC
  Filled 2022-06-18: qty 2

## 2022-06-18 MED ORDER — INSULIN DETEMIR 100 UNIT/ML ~~LOC~~ SOLN
26.0000 [IU] | Freq: Two times a day (BID) | SUBCUTANEOUS | Status: DC
  Administered 2022-06-18 – 2022-06-19 (×3): 26 [IU] via SUBCUTANEOUS
  Filled 2022-06-18 (×3): qty 0.26

## 2022-06-18 NOTE — Inpatient Diabetes Management (Signed)
Inpatient Diabetes Program Recommendations  AACE/ADA: New Consensus Statement on Inpatient Glycemic Control (2015)  Target Ranges:  Prepandial:   less than 140 mg/dL      Peak postprandial:   less than 180 mg/dL (1-2 hours)      Critically ill patients:  140 - 180 mg/dL    Latest Reference Range & Units 06/17/22 08:00 06/17/22 12:15 06/17/22 17:15 06/17/22 21:19 06/18/22 08:03  Glucose-Capillary 70 - 99 mg/dL 221 (H) 218 (H) 222 (H) 316 (H) 280 (H)  Review of Glycemic Control  Diabetes history: DM2 Outpatient Diabetes medications: Metformin 500 mg BID, Rybelsus 14 mg daily Current orders for Inpatient glycemic control: Levemir 20 units BID, Novolog 8 units TID with meals, Novolog 0-9 units TID with meals, Novolog 0-5 units QHS   Inpatient Diabetes Program Recommendations:     Insulin: Noted Levemir increased from 20 to 26 units BID. If post prandial glucose remains consistently over 180 mg/dl, please consider increasing meal coverage to Novolog 12 units TID with meals.  Outpatient: Patient has taken Semglee and Novolog in the past (stopped on 02/10/22 per PCP note). A1C has increased to 11.5% on 06/02/22 (was 6.3% on 02/04/22).  Anticipate patient will need to be discharged on insulin.   Thanks, Barnie Alderman, RN, MSN, [place] Diabetes Coordinator Inpatient Diabetes Program [phone] (Team Pager from 8am to [place])

## 2022-06-18 NOTE — Progress Notes (Signed)
Physical Therapy Treatment Patient Details Name: Justin Griffith MRN: [id number] DOB: July 16, 1940 Today's Date: 06/18/2022   History of Present Illness Pt is an 82 yo male with PMH of dementia, HTN, HLD, COPD, DM, PE, chronic indwelling catheter. Admitted for  hematuria.    PT Comments    Patient alert, agreeable to PT with some encouragement. Exhibited some pain signs/symptoms with mobility today, referenced buttocks and back. The patient needed maxA to transition to EOB, pt encouraged to utilize bed rails. Sit <> stand from EOB as well as BSC this session. Effortful for pt, and relied on BUE support, CGA. He was unable to perform pericare without assistance and was unable to ambulate due to fatigue. Up in chair at end of session. The patient would benefit from further skilled PT intervention to continue to progress towards goals. Recommendation updated for more intensive therapy though still <3hours a day due to decline in mobility, pt fatigue, and poor endurance.      Recommendations for follow up therapy are one component of a multi-disciplinary discharge planning process, led by the attending physician.  Recommendations may be updated based on patient status, additional functional criteria and insurance authorization.  Follow Up Recommendations  Can patient physically be transported by private vehicle: No    Assistance Recommended at Discharge Frequent or constant Supervision/Assistance  Patient can return home with the following A lot of help with walking and/or transfers;Help with stairs or ramp for entrance;Assist for transportation;Assistance with cooking/housework;A lot of help with bathing/dressing/bathroom   Equipment Recommendations  None recommended by PT    Recommendations for Other Services       Precautions / Restrictions Precautions Precautions: Fall Restrictions Weight Bearing Restrictions: No     Mobility  Bed Mobility Overal bed mobility: Needs Assistance Bed  Mobility: Supine to Sit, Sit to Supine     Supine to sit: Max assist          Transfers Overall transfer level: Needs assistance Equipment used: Rolling walker (2 wheels) Transfers: Sit to/from Stand Sit to Stand: Min guard           General transfer comment: needed extended time but able to rise from [facility] and EOB    Ambulation/Gait               General Gait Details: unable   Stairs             Wheelchair Mobility    Modified Rankin (Stroke Patients Only)       Balance Overall balance assessment: Needs assistance Sitting-balance support: Feet supported Sitting balance-Leahy Scale: Fair     Standing balance support: Bilateral upper extremity supported, During functional activity, Reliant on assistive device for balance Standing balance-Leahy Scale: Poor                              Cognition Arousal/Alertness: Awake/alert Behavior During Therapy: Flat affect Overall Cognitive Status: History of cognitive impairments - at baseline                                 General Comments: Pt required encouragement to participate. VC for overall safety awareness        Exercises Other Exercises Other Exercises: assist needed for pericare    General Comments        Pertinent Vitals/Pain Pain Assessment Pain Assessment: Faces Faces Pain Scale: Hurts little more  Pain Location: back and buttocks with movement Pain Descriptors / Indicators: Grimacing, Moaning Pain Intervention(s): Limited activity within patient's tolerance, Monitored during session, Repositioned    Home Living                          Prior Function            PT Goals (current goals can now be found in the care plan section) Progress towards PT goals: Progressing toward goals    Frequency    Min 2X/week      PT Plan Discharge plan needs to be updated    Co-evaluation              AM-PAC PT "6 Clicks" Mobility    Outcome Measure  Help needed turning from your back to your side while in a flat bed without using bedrails?: A Lot Help needed moving from lying on your back to sitting on the side of a flat bed without using bedrails?: A Lot Help needed moving to and from a bed to a chair (including a wheelchair)?: A Lot Help needed standing up from a chair using your arms (e.g., wheelchair or bedside chair)?: A Little Help needed to walk in hospital room?: A Lot Help needed climbing 3-5 steps with a railing? : Total 6 Click Score: 12    End of Session   Activity Tolerance: Patient limited by fatigue Patient left: in chair;with chair alarm set;with call bell/phone within reach;with nursing/sitter in room Nurse Communication: Mobility status PT Visit Diagnosis: Other abnormalities of gait and mobility (R26.89);History of falling (Z91.81)     Time: [id number] PT Time Calculation (min) (ACUTE ONLY): 13 min  Charges:  $Therapeutic Activity: 8-22 mins                     Lieutenant Diego PT, DPT 11:40 AM,06/18/22

## 2022-06-18 NOTE — Progress Notes (Signed)
Progress Note   Patient: Justin Griffith [id number] DOB: 06-15-1940 DOA: 06/13/2022     4 DOS: the patient was seen and examined on 06/18/2022   Brief hospital course: Justin Griffith is a 82 y.o. male with medical history significant of hypertension, hyperlipidemia, diabetes mellitus, COPD/asthma, stroke, depression, OSA, colon cancer, BPH with urinary retention with chronic indwelling catheter placement, PE on Eliquis, obesity, CAD, CABG, CKD-3B, dCHF, PAD, chronic indwelling Foley cath, who presents to ED due to Foley cath not draining.  He has some gross hematuria and Foley catheter was advanced.  Patient has been seen by urology, apparently patient had recurrent obstruction with Foley catheter with blood clots.  Anticoagulation was discontinued.  Patient is also seen by vascular surgery.  Not planning for IVC filter.  Patient has completed 6 months of anticoagulation for his a first episode of PE. Anticoagulation is on hold.  Hematuria is improving.  Patient was also started on treated with Rocephin, urine culture came back without growth, urology still want antibiotics due to obstruction.     Principal Problem:   Urinary retention Active Problems:   BPH with obstruction/lower urinary tract symptoms Foley dependent   Gross hematuria   UTI (urinary tract infection)   Acute renal failure superimposed on stage 3b chronic kidney disease (HCC)   CAD (coronary artery disease)   Chronic obstructive pulmonary disease (COPD) (HCC)   Chronic diastolic CHF (congestive heart failure) (HCC)   Pulmonary embolism (HCC)   History of pulmonary embolism 09/18/21   HTN (hypertension)   HLD (hyperlipidemia)   Stage 3b chronic kidney disease (CKD) (HCC)   Uncontrolled type 2 diabetes mellitus with hyperglycemia, with long-term current use of insulin (HCC)   History of CVA (cerebrovascular accident)   Sleep apnea   Obesity (BMI 30-39.9)   Senile dementia with delirium ([place])   Pressure injury of skin    B12 deficiency anemia   Assessment and Plan:  Urinary retention due to gross hematuria and BPH with obstruction/lower urinary tract symptoms Foley dependent Possible urinary tract infection with hematuria. Patient condition was better, patient did well with voiding trial, Foley catheter was removed.  However, patient has severe penile pain afterwards.  Patient treated with pain medicine, pyridium.   Pyridium discontinued due to worsening renal function. Penile pain is better, continue antibiotics for additional 5 days.   Vitamin B12 deficient anemia. Previously, patient had lowl B12 level, current B12 level borderline.  Restarted B12 supplement.   Hyponatremia. Acute kidney injury on chronic kidney disease stage IIIb. Renal function is worsened today, renal ultrasound did not show any obstruction, showed medical kidney disease.  Continue salt tablets for hyponatremia.   Renal functions better after giving IV fluids.  No volume overload, torsemide discontinued.   History of pulmonary embolism 09/18/21 Anticoagulation on hold due to active bleeding.  Per vascular surgery, patient was supposed to be taking Eliquis for 6 months, which has completed.  However, patient does have moderate to high risk for recurrent thrombosis.  Discussed with patient wife, will continue hold anticoagulation. Since Foley catheter is removed, may consider restart anticoagulation in a week or 2.   Dementia with delirium. Condition stable.   Essential hypertension Continue home medicine.   CAD (coronary artery disease):  -Continue Crestor   Chronic obstructive pulmonary disease (COPD) ([place]):  Stable.   Type II diabetes mellitus with renal manifestations ([place]):  Uncontrolled type 2 diabetes with hyperglycemia. Hemoglobin A1c 11.5 on 06/02/2022.  Increase Levemir again today.   HLD (  hyperlipidemia) - Crestor    Chronic diastolic CHF (congestive heart failure) ([place]): 2D echo on 09/20/2021 showed EF of 60 to  65%.   Patient euvolemic, no exacerbation. Torsemide on hold due to worsening renal function.  History of CVA (cerebrovascular accident) -  Crestor   Obesity with body mass index (BMI) of 30.0 to 39.9: Body weight 113.1 kg, BMI 35.77  -Diet and exercise.       Subjective: Patient is doing better, able to urinate.  No shortness of breath.  Physical Exam: Vitals:   06/17/22 1040 06/17/22 1601 06/18/22 0500 06/18/22 0845  BP: (!) 151/64 117/62  (!) 121/56  Pulse: 68 67  (!) 51  Resp:  17  16  Temp:  98.1 F (36.7 C)  97.8 F (36.6 C)  TempSrc:      SpO2:  97%  (!) 87%  Weight:   114.5 kg   Height:       General exam: Appears calm and comfortable  Respiratory system: Clear to auscultation. Respiratory effort normal. Cardiovascular system: S1 & S2 heard, RRR. No JVD, murmurs, rubs, gallops or clicks. No pedal edema. Gastrointestinal system: Abdomen is nondistended, soft and nontender. No organomegaly or masses felt. Normal bowel sounds heard. Central nervous system: Alert and oriented. No focal neurological deficits. Extremities: Symmetric 5 x 5 power. Skin: No rashes, lesions or ulcers Psychiatry: Judgement and insight appear normal. Mood & affect appropriate.    Data Reviewed:  Lab results reviewed.  Family Communication: None  Disposition: Status is: Inpatient Remains inpatient appropriate because: Severity of disease. Patient need nursing home placement, however, patient want to try again tomorrow to see if he can go home.     Time spent: 35 minutes  Author: Sharen Hones, MD 06/18/2022 12:15 PM  For on call review [email or web address].

## 2022-06-18 NOTE — Progress Notes (Signed)
Patient is alert and oriented x4. Unmotivated to ambulate and be active. Got up x2 to bedside commode x1 last night. Patient has been educated regarding diet as blood glucose was over 300 last night. Patient constantly asks for food and drinks. Urinary output was 1800 mls overnight. Patient denied pain.

## 2022-06-18 NOTE — NC FL2 (Signed)
[facility] LEVEL OF CARE FORM     IDENTIFICATION  Patient Name: Justin Griffith Birthdate: Feb 29, 1941 Sex: male Admission Date (Current Location): 06/13/2022  [facility] and [place] Number:  Engineering geologist and Address:  [facility], [address], [place], [place] [address]      Provider Number: [id number]  Attending Physician Name and Address:  Sharen Hones, MD  Relative Name and Phone Number:  Gleb Swearinger [phone]    Current Level of Care: Hospital Recommended Level of Care: [place] Prior Approval Number:    Date Approved/Denied:   PASRR Number: [id number]  Discharge Plan: SNF    Current Diagnoses: Patient Active Problem List   Diagnosis Date Noted   B12 deficiency anemia 06/15/2022   Senile dementia with delirium ([place]) 06/14/2022   Pressure injury of skin 06/14/2022   UTI (urinary tract infection) 06/13/2022   Urinary retention 06/13/2022   Pulmonary embolism ([place]) 06/13/2022   HTN (hypertension) 06/13/2022   Cutaneous abscess of back (any part, except buttock) 05/14/2022   Inflamed sebaceous cyst 05/14/2022   Senile purpura ([place]) 04/09/2022   Fever blister 03/20/2022   Iron deficiency anemia 03/11/2022   Normocytic anemia 03/10/2022   Mild dementia without behavioral disturbance, psychotic disturbance, mood disturbance, or anxiety ([place]) 02/24/2022   Benign hypertensive renal disease 02/04/2022   Pressure injury of sacral region, unstageable ([place])    Acute renal failure superimposed on stage 3b chronic kidney disease ([place]) 11/18/2021   HLD (hyperlipidemia) 10/31/2021   Uncontrolled type 2 diabetes mellitus with hyperglycemia, with long-term current use of insulin ([place]) 10/31/2021   CAD (coronary artery disease) 10/31/2021   Chronic diastolic CHF (congestive heart failure) ([place]) 10/31/2021   Right elbow pain 10/15/2021   Gross hematuria 10/12/2021   Chronic anticoagulation 10/12/2021   Stage 3b  chronic kidney disease (CKD) ([place]) 10/12/2021   History of pulmonary embolism 09/18/21 10/06/2021   Generalized weakness 06/25/2021   Lymphedema 03/17/2021   Falls frequently 11/28/2020   Aortic atherosclerosis ([place]) 06/12/2020   History of embolic stroke [id number]   Hyponatremia 03/29/2020   History of non-ST elevation myocardial infarction (NSTEMI) 03/28/2020   Elevated TSH 02/25/2020   History of CVA (cerebrovascular accident) 01/23/2020   Chronic venous insufficiency 05/23/2019   B12 deficiency 04/23/2019   Tremor 11/14/2018   Obesity (BMI 30-39.9) 09/07/2017   Chronic obstructive pulmonary disease (COPD) ([place]) 04/29/2017   Intertrigo 02/02/2017   BPH with obstruction/lower urinary tract symptoms Foley dependent 07/23/2016   Advanced care planning/counseling discussion [id number]   Eosinophilic esophagitis [id number]   PAD (peripheral artery disease) ([place]) 01/08/2016   GERD without esophagitis 01/06/2016   Hyperlipidemia associated with type 2 diabetes mellitus ([place]) 01/16/2015   Sleep apnea 01/16/2015    Orientation RESPIRATION BLADDER Height & Weight     Self, Time, Situation, Place  Normal Incontinent Weight: 114.5 kg Height:  5\' 10"  (177.8 cm)  BEHAVIORAL SYMPTOMS/MOOD NEUROLOGICAL BOWEL NUTRITION STATUS      Continent  (See discharge sumamry)  AMBULATORY STATUS COMMUNICATION OF NEEDS Skin   Extensive Assist Verbally Normal                       Personal Care Assistance Level of Assistance  Bathing, Dressing Bathing Assistance: Maximum assistance   Dressing Assistance: Maximum assistance     Functional Limitations Info             SPECIAL CARE FACTORS FREQUENCY  PT (By licensed PT), OT (By  licensed OT)     PT Frequency: 5x weekly OT Frequency: 5x weekly            Contractures Contractures Info: Not present    Additional Factors Info  Code Status, Allergies Code Status Info: Full Code Allergies Info: Farxiga (Dapagliflozin), Dulaglutide,  Liraglutide, Jardiance (Empagliflozin)           Current Medications (06/18/2022):  This is the current hospital active medication list Current Facility-Administered Medications  Medication Dose Route Frequency Provider Last Rate Last Admin   acetaminophen (TYLENOL) tablet 650 mg  650 mg Oral Q6H PRN Ivor Costa, MD       albuterol (PROVENTIL) (2.5 MG/3ML) 0.083% nebulizer solution 3 mL  3 mL Inhalation Q4H PRN Ivor Costa, MD       cephALEXin (KEFLEX) capsule 500 mg  500 mg Oral Q8H Sharen Hones, MD   500 mg at 06/18/22 1331   Chlorhexidine Gluconate Cloth 2 % PADS 6 each  6 each Topical Daily Sharen Hones, MD   6 each at 06/18/22 [id number]   cloNIDine (CATAPRES) tablet 0.1 mg  0.1 mg Oral BID Ivor Costa, MD   0.1 mg at 06/18/22 [id number]   cyanocobalamin (VITAMIN B12) tablet 1,000 mcg  1,000 mcg Oral Daily Sharen Hones, MD   1,000 mcg at 06/18/22 [id number]   dextromethorphan-guaiFENesin (Bend DM) 30-600 MG per 12 hr tablet 1 tablet  1 tablet Oral BID PRN Ivor Costa, MD       gabapentin (NEURONTIN) capsule 400 mg  400 mg Oral TID Ivor Costa, MD   400 mg at 06/18/22 [id number]   hydrALAZINE (APRESOLINE) injection 5 mg  5 mg Intravenous Q2H PRN Ivor Costa, MD       hydrALAZINE (APRESOLINE) tablet 100 mg  100 mg Oral TID Ivor Costa, MD   100 mg at 06/18/22 0924   insulin aspart (novoLOG) injection 0-5 Units  0-5 Units Subcutaneous QHS Ivor Costa, MD   4 Units at 06/17/22 2224   insulin aspart (novoLOG) injection 0-9 Units  0-9 Units Subcutaneous TID WC Ivor Costa, MD   3 Units at 06/18/22 1216   insulin aspart (novoLOG) injection 8 Units  8 Units Subcutaneous TID WC Sharen Hones, MD   8 Units at 06/18/22 1215   insulin detemir (LEVEMIR) injection 26 Units  26 Units Subcutaneous BID Sharen Hones, MD   26 Units at 06/18/22 0925   losartan (COZAAR) tablet 100 mg  100 mg Oral Daily Ivor Costa, MD   100 mg at 06/18/22 [id number]   multivitamin with minerals tablet 1 tablet  1 tablet Oral Daily Ivor Costa, MD   1 tablet at  06/18/22 [id number]   nystatin (MYCOSTATIN/NYSTOP) topical powder   Topical BID Ivor Costa, MD   Given at 06/18/22 0926   olopatadine (PATANOL) 0.1 % ophthalmic solution 1 drop  1 drop Both Eyes BID PRN Renda Rolls, RPH       ondansetron (ZOFRAN) injection 4 mg  4 mg Intravenous Q8H PRN Ivor Costa, MD       oxyCODONE-acetaminophen (PERCOCET/ROXICET) 5-325 MG per tablet 1 tablet  1 tablet Oral Q6H PRN Sharen Hones, MD   1 tablet at 06/17/22 1755   pantoprazole (PROTONIX) EC tablet 40 mg  40 mg Oral Daily Ivor Costa, MD   40 mg at 06/18/22 [id number]   polyethylene glycol powder (GLYCOLAX/MIRALAX) container 17 g  17 g Oral BID PRN Ivor Costa, MD       rosuvastatin (CRESTOR) tablet 10 mg  10 mg  Oral Gordan Payment, MD   10 mg at 06/17/22 2224   sodium chloride tablet 1 g  1 g Oral BID WC Sharen Hones, MD   1 g at 06/18/22 0924   umeclidinium bromide (INCRUSE ELLIPTA) 62.5 MCG/ACT 1 puff  1 puff Inhalation Daily Ivor Costa, MD   1 puff at 06/18/22 [id number]     Discharge Medications: Please see discharge summary for a list of discharge medications.  Relevant Imaging Results:  Relevant Lab Results:   Additional Information [id number]  Carol Ada, RN

## 2022-06-18 NOTE — TOC Progression Note (Signed)
Transition of Care [facility]) - Progression Note    Patient Details  Name: BALJINDER KRECH MRN: [id number] Date of Birth: Jun 11, 1940  Transition of Care [facility]) CM/SW [place], RN Phone Number: 06/18/2022, 2:31 PM  Clinical Narrative:   Fl2 completed.  Bed search completed to [facility].    TOC will continue to follow for discharge planning.      Expected Discharge Plan: [place] Barriers to Discharge: No Barriers Identified  Expected Discharge Plan and Services   Discharge Planning Services: CM Consult   Living arrangements for the past 2 months: Single Family Home                 DME Arranged: N/A DME Agency: NA                   Social Determinants of Health (SDOH) Interventions SDOH Screenings   Food Insecurity: No Food Insecurity (06/13/2022)  Housing: Low Risk  (06/13/2022)  Transportation Needs: No Transportation Needs (06/13/2022)  Utilities: Not At Risk (06/13/2022)  Alcohol Screen: Low Risk  (03/03/2022)  Depression (PHQ2-9): High Risk (04/03/2022)  Financial Resource Strain: Low Risk  (03/03/2022)  Physical Activity: Inactive (03/03/2022)  Social Connections: Moderately Integrated (03/03/2022)  Stress: No Stress Concern Present (03/03/2022)  Tobacco Use: Medium Risk (06/13/2022)    Readmission Risk Interventions    06/17/2022    2:16 PM 11/24/2021   10:33 AM 09/21/2021   10:42 AM  Readmission Risk Prevention Plan  Transportation Screening Complete Complete Complete  PCP or Specialist Appt within 3-5 Days   Complete  HRI or [place]   Complete  Social Work Consult for [place] Planning/Counseling   Complete  Palliative Care Screening   Not Applicable  Medication Review Press photographer) Complete Complete Complete  PCP or Specialist appointment within 3-5 days of discharge Complete Complete   HRI or Home Care Consult Complete Complete   SW Recovery Care/Counseling Consult Complete Complete   Palliative  Care Screening Not Applicable Not [place] Not Applicable Complete

## 2022-06-19 ENCOUNTER — Ambulatory Visit: Payer: PPO | Admitting: Family Medicine

## 2022-06-19 DIAGNOSIS — N138 Other obstructive and reflux uropathy: Secondary | ICD-10-CM | POA: Diagnosis not present

## 2022-06-19 DIAGNOSIS — N401 Enlarged prostate with lower urinary tract symptoms: Secondary | ICD-10-CM | POA: Diagnosis not present

## 2022-06-19 DIAGNOSIS — R310 Gross hematuria: Secondary | ICD-10-CM

## 2022-06-19 DIAGNOSIS — N178 Other acute kidney failure: Secondary | ICD-10-CM | POA: Diagnosis not present

## 2022-06-19 LAB — CBC
HCT: 25 % — ABNORMAL LOW (ref 39.0–52.0)
Hemoglobin: 7.9 g/dL — ABNORMAL LOW (ref 13.0–17.0)
MCH: 26.7 pg (ref 26.0–34.0)
MCHC: 31.6 g/dL (ref 30.0–36.0)
MCV: 84.5 fL (ref 80.0–100.0)
Platelets: 265 10*3/uL (ref 150–400)
RBC: 2.96 MIL/uL — ABNORMAL LOW (ref 4.22–5.81)
RDW: 14.4 % (ref 11.5–15.5)
WBC: 7.1 10*3/uL (ref 4.0–10.5)
nRBC: 0 % (ref 0.0–0.2)

## 2022-06-19 LAB — GLUCOSE, CAPILLARY
Glucose-Capillary: 220 mg/dL — ABNORMAL HIGH (ref 70–99)
Glucose-Capillary: 245 mg/dL — ABNORMAL HIGH (ref 70–99)
Glucose-Capillary: 166 mg/dL — ABNORMAL HIGH (ref 70–99)
Glucose-Capillary: 252 mg/dL — ABNORMAL HIGH (ref 70–99)

## 2022-06-19 LAB — BASIC METABOLIC PANEL
Anion gap: 6 (ref 5–15)
BUN: 56 mg/dL — ABNORMAL HIGH (ref 8–23)
CO2: 27 mmol/L (ref 22–32)
Calcium: 9.1 mg/dL (ref 8.9–10.3)
Chloride: 99 mmol/L (ref 98–111)
Creatinine, Ser: 1.7 mg/dL — ABNORMAL HIGH (ref 0.61–1.24)
GFR, Estimated: 40 mL/min — ABNORMAL LOW (ref >60)
Glucose, Bld: 272 mg/dL — ABNORMAL HIGH (ref 70–99)
Potassium: 4.5 mmol/L (ref 3.5–5.1)
Sodium: 132 mmol/L — ABNORMAL LOW (ref 135–145)

## 2022-06-19 MED ORDER — INSULIN DETEMIR 100 UNIT/ML ~~LOC~~ SOLN
30.0000 [IU] | Freq: Two times a day (BID) | SUBCUTANEOUS | Status: DC
  Administered 2022-06-19 – 2022-06-22 (×6): 30 [IU] via SUBCUTANEOUS
  Filled 2022-06-19 (×7): qty 0.3

## 2022-06-19 MED ORDER — SIMETHICONE 80 MG PO CHEW
80.0000 mg | CHEWABLE_TABLET | Freq: Four times a day (QID) | ORAL | Status: DC | PRN
  Administered 2022-06-19 – 2022-06-21 (×5): 80 mg via ORAL
  Filled 2022-06-19 (×7): qty 1

## 2022-06-19 MED ORDER — TORSEMIDE 20 MG PO TABS
40.0000 mg | ORAL_TABLET | Freq: Every day | ORAL | Status: DC
  Administered 2022-06-19 – 2022-06-22 (×4): 40 mg via ORAL
  Filled 2022-06-19 (×4): qty 2

## 2022-06-19 NOTE — Progress Notes (Signed)
Progress Note   Patient: Justin Griffith [id number] DOB: 1940-12-11 DOA: 06/13/2022     5 DOS: the patient was seen and examined on 06/19/2022   Brief hospital course: Justin Griffith is a 82 y.o. male with medical history significant of hypertension, hyperlipidemia, diabetes mellitus, COPD/asthma, stroke, depression, OSA, colon cancer, BPH with urinary retention with chronic indwelling catheter placement, PE on Eliquis, obesity, CAD, CABG, CKD-3B, dCHF, PAD, chronic indwelling Foley cath, who presents to ED due to Foley cath not draining.  He has some gross hematuria and Foley catheter was advanced.  Patient has been seen by urology, apparently patient had recurrent obstruction with Foley catheter with blood clots.  Anticoagulation was discontinued.  Patient is also seen by vascular surgery.  Not planning for IVC filter.  Patient has completed 6 months of anticoagulation for his a first episode of PE. Anticoagulation is on hold.  Hematuria is improving.  Patient was also started on treated with Rocephin, urine culture came back without growth, urology still want antibiotics due to obstruction.     Principal Problem:   Urinary retention Active Problems:   BPH with obstruction/lower urinary tract symptoms Foley dependent   Gross hematuria   UTI (urinary tract infection)   Acute renal failure superimposed on stage 3b chronic kidney disease (HCC)   CAD (coronary artery disease)   Chronic obstructive pulmonary disease (COPD) (HCC)   Chronic diastolic CHF (congestive heart failure) (HCC)   Pulmonary embolism (HCC)   History of pulmonary embolism 09/18/21   HTN (hypertension)   HLD (hyperlipidemia)   Stage 3b chronic kidney disease (CKD) (HCC)   Uncontrolled type 2 diabetes mellitus with hyperglycemia, with long-term current use of insulin (HCC)   History of CVA (cerebrovascular accident)   Sleep apnea   Obesity (BMI 30-39.9)   Senile dementia with delirium ([place])   Pressure injury of skin    B12 deficiency anemia   Assessment and Plan: Urinary retention due to gross hematuria and BPH with obstruction/lower urinary tract symptoms Foley dependent Possible urinary tract infection with hematuria. Patient condition was better, patient did well with voiding trial, Foley catheter was removed.  However, patient has severe penile pain afterwards.  Patient treated with pain medicine, pyridium.   Pyridium discontinued due to worsening renal function. Penile pain is better, continue oral antibiotics for another 4 days.   Vitamin B12 deficient anemia. Previously, patient had lowl B12 level, current B12 level borderline.  Restarted B12 supplement. Hemoglobin is low but stable.  Hyponatremia. Acute kidney injury on chronic kidney disease stage IIIb. Renal function is worsened today, renal ultrasound did not show any obstruction, showed medical kidney disease.  Continue salt tablets for hyponatremia.   Renal functions better after giving IV fluids.     History of pulmonary embolism 09/18/21 Anticoagulation on hold due to active bleeding.  Per vascular surgery, patient was supposed to be taking Eliquis for 6 months, which has completed.  However, patient does have moderate to high risk for recurrent thrombosis.  Discussed with patient wife, will continue hold anticoagulation. Since Foley catheter is removed, may consider restart anticoagulation in a week or 2.   Dementia with delirium. Condition stable.   Essential hypertension Continue home medicine.   CAD (coronary artery disease):  -Continue Crestor   Chronic obstructive pulmonary disease (COPD) ([place]):  Stable.   Type II diabetes mellitus with renal manifestations ([place]):  Uncontrolled type 2 diabetes with hyperglycemia. Hemoglobin A1c 11.5 on 06/02/2022.  Insulin dose increased again.   HLD (  hyperlipidemia) - Crestor    Chronic diastolic CHF (congestive heart failure) ([place]): 2D echo on 09/20/2021 showed EF of 60 to 65%.    Patient euvolemic, no exacerbation. Torsemide is restarted today, patient renal function has improved.  History of CVA (cerebrovascular accident) -  Crestor   Obesity with body mass index (BMI) of 30.0 to 39.9: Body weight 113.1 kg, BMI 35.77  -Diet and exercise.           Subjective:  Patient is a very weak, can barely stand.  Some short of breath with exertion.  Physical Exam: Vitals:   06/18/22 1521 06/18/22 2126 06/19/22 0500 06/19/22 0858  BP: (!) 175/83 (!) 155/69  (!) 145/67  Pulse: 78 64  67  Resp: 16 18  18   Temp: 98 F (36.7 C) 98 F (36.7 C)  98 F (36.7 C)  TempSrc:  Oral    SpO2: 98% 98%    Weight:   114.8 kg   Height:       General exam: Appears calm and comfortable  Respiratory system: Decreased breath sounds with a few crackles in the bases. Respiratory effort normal. Cardiovascular system: S1 & S2 heard, RRR. No JVD, murmurs, rubs, gallops or clicks. No pedal edema. Gastrointestinal system: Abdomen is nondistended, soft and nontender. No organomegaly or masses felt. Normal bowel sounds heard. Central nervous system: Alert and oriented. No focal neurological deficits. Extremities: Symmetric 5 x 5 power. Skin: No rashes, lesions or ulcers Psychiatry: Judgement and insight appear normal. Mood & affect appropriate.    Data Reviewed:  Lab results reviewed.  Family Communication: None  Disposition: Status is: Inpatient Remains inpatient appropriate because: Severity of disease, unsafe discharge pending nursing home placement     Time spent: 35 minutes  Author: Sharen Hones, MD 06/19/2022 1:42 PM  For on call review [email or web address].

## 2022-06-19 NOTE — Care Management Important Message (Signed)
Important Message  Patient Details  Name: Justin Griffith MRN: [id number] Date of Birth: [birth year]   Medicare Important Message Given:  Yes     Dannette Barbara 06/19/2022, 11:14 AM

## 2022-06-19 NOTE — TOC Progression Note (Signed)
Transition of Care [facility]) - Progression Note    Patient Details  Name: ERUBEY SUPPES MRN: [id number] Date of Birth: July 07, 1940  Transition of Care [facility]) CM/SW [place], RN Phone Number: 06/19/2022, 3:00 PM  Clinical Narrative:    No bed offers at this time, resent out the bedsearch looking for STR   Expected Discharge Plan: [place] Barriers to Discharge: No Barriers Identified  Expected Discharge Plan and Services   Discharge Planning Services: CM Consult   Living arrangements for the past 2 months: Single Family Home                 DME Arranged: N/A DME Agency: NA                   Social Determinants of Health ([place]) Interventions SDOH Screenings   Food Insecurity: No Food Insecurity (06/13/2022)  Housing: Low Risk  (06/13/2022)  Transportation Needs: No Transportation Needs (06/13/2022)  Utilities: Not At Risk (06/13/2022)  Alcohol Screen: Low Risk  (03/03/2022)  Depression (PHQ2-9): High Risk (04/03/2022)  Financial Resource Strain: Low Risk  (03/03/2022)  Physical Activity: Inactive (03/03/2022)  Social Connections: Moderately Integrated (03/03/2022)  Stress: No Stress Concern Present (03/03/2022)  Tobacco Use: Medium Risk (06/13/2022)    Readmission Risk Interventions    06/17/2022    2:16 PM 11/24/2021   10:33 AM 09/21/2021   10:42 AM  Readmission Risk Prevention Plan  Transportation Screening Complete Complete Complete  PCP or Specialist Appt within 3-5 Days   Complete  HRI or [place]   Complete  Social Work Consult for [place] Planning/Counseling   Complete  Palliative Care Screening   Not Applicable  Medication Review Press photographer) Complete Complete Complete  PCP or Specialist appointment within 3-5 days of discharge Complete Complete   HRI or Home Care Consult Complete Complete   SW Recovery Care/Counseling Consult Complete Complete   Palliative Care Screening Not Applicable Not [place] Not Applicable Complete

## 2022-06-19 NOTE — Inpatient Diabetes Management (Signed)
Inpatient Diabetes Program Recommendations  AACE/ADA: New Consensus Statement on Inpatient Glycemic Control (2015)  Target Ranges:  Prepandial:   less than 140 mg/dL      Peak postprandial:   less than 180 mg/dL (1-2 hours)      Critically ill patients:  140 - 180 mg/dL    Latest Reference Range & Units 06/18/22 08:03 06/18/22 11:35 06/18/22 17:08 06/18/22 20:32  Glucose-Capillary 70 - 99 mg/dL 280 (H)  13 units Novolog  26 units Semglee  220 (H)  11 units Novolog  150 (H)  9 units Novolog  166 (H)     26 units Semglee  (H): Data is abnormally high  Latest Reference Range & Units 06/19/22 08:54  Glucose-Capillary 70 - 99 mg/dL 220 (H)  11 units Novolog  26 units Semglee  (H): Data is abnormally high      Home DM Meds: Metformin 500 mg BID        Rybelsus 14 mg daily    Current Orders: Levemir 45 units BID     Novolog 8 units TID with meals     Novolog Sensitive Correction Scale/ SSI (0-9 units) TID AC + HS   MD- Note Levemir dose increased significantly to 45 units BID  CBG only 220 this AM  May consider more conservative increase of the Levemir to 30 units BID   Outpatient: Patient has taken Semglee and Novolog in the past (stopped on 02/10/22 per PCP note). A1C has increased to 11.5% on 06/02/22 (was 6.3% on 02/04/22).  May need to consider restarting insulin for home.    --Will follow patient during hospitalization--  Wyn Quaker RN, MSN, [place] [facility] Coordinator Inpatient Glycemic Control Team Team Pager: [phone] (8a-5p)

## 2022-06-19 NOTE — Plan of Care (Signed)

## 2022-06-19 NOTE — Progress Notes (Signed)
Occupational Therapy Treatment Patient Details Name: Justin Griffith MRN: [id number] DOB: December 16, 1940 Today's Date: 06/19/2022   History of present illness Pt is an 82 yo male with PMH of dementia, HTN, HLD, COPD, DM, PE, chronic indwelling catheter. Admitted for  hematuria.   OT comments  Justin Griffith was seen for OT treatment on this date. Upon arrival to room pt awake/alert, semi-supine in bed, agreeable to OT tx session. OT facilitated ADL management as described below (see ADL section for additional information on functional independence). Pt continues to be functionally limited by generalized weakness, decreased activity tolerance, decreased safety awareness and impaired balance. He requires MOD A for toilet transfers and MAX A For peri-care after toileting this date. Pt continues to benefit from skilled OT services to maximize return to PLOF and minimize risk of future falls, injury, caregiver burden, and readmission. Will continue to follow POC. Anticipate pt will require ongoing occupational therapy services (< 3 hours/day) to address noted functional impairments and limitations.     Recommendations for follow up therapy are one component of a multi-disciplinary discharge planning process, led by the attending physician.  Recommendations may be updated based on patient status, additional functional criteria and insurance authorization.    Assistance Recommended at Discharge Frequent or constant Supervision/Assistance  Patient can return home with the following  Assistance with cooking/housework;Assist for transportation;Help with stairs or ramp for entrance;A little help with walking and/or transfers;A lot of help with bathing/dressing/bathroom   Equipment Recommendations  None recommended by OT    Recommendations for Other Services      Precautions / Restrictions Precautions Precautions: Fall Restrictions Weight Bearing Restrictions: No       Mobility Bed Mobility Overal bed  mobility: Needs Assistance Bed Mobility: Supine to Sit     Supine to sit: Max assist, HOB elevated          Transfers Overall transfer level: Needs assistance Equipment used: Rolling walker (2 wheels) Transfers: Sit to/from Stand Sit to Stand: Mod assist, +2 safety/equipment, +2 physical assistance           General transfer comment: +2 MIN/MOD A for initial STS able to progress to +1 MOD A for additional standing bouts during session.     Balance Overall balance assessment: Needs assistance Sitting-balance support: Feet supported, Single extremity supported Sitting balance-Leahy Scale: Good Sitting balance - Comments: steady static sitting, reaching within BOS tends to utilize at least 1 UE support 2/2 poor activity tolerance.   Standing balance support: Bilateral upper extremity supported, During functional activity, Reliant on assistive device for balance Standing balance-Leahy Scale: Poor Standing balance comment: requires assist to maintain standing balance                           ADL either performed or assessed with clinical judgement   ADL                           Toilet Transfer: Moderate assistance;Rolling walker (2 wheels) Toilet Transfer Details (indicate cue type and reason): MOD A to step pivot to [facility]. Toileting- Clothing Manipulation and Hygiene: Sit to/from stand;Maximal assistance Toileting - Clothing Manipulation Details (indicate cue type and reason): Pt noted with episode of bowel incontinence, appeared unaware, requires cueing and MAX A for peri-care.     Functional mobility during ADLs: Cueing for sequencing;Rolling walker (2 wheels);Cueing for safety;Moderate assistance;+2 for safety/equipment General ADL Comments: Pt requires MOD A  For STS from variable height surfaces, MOD A for functional mobility with +2 for safety but no additional physical assist.    Extremity/Trunk Assessment Upper Extremity Assessment Upper  Extremity Assessment: Generalized weakness   Lower Extremity Assessment Lower Extremity Assessment: Generalized weakness   Cervical / Trunk Assessment Cervical / Trunk Assessment: Normal    Vision Patient Visual Report: No change from baseline     Perception     Praxis      Cognition Arousal/Alertness: Awake/alert Behavior During Therapy: WFL for tasks assessed/performed, Flat affect Overall Cognitive Status: History of cognitive impairments - at baseline                                 General Comments: Pt required encouragement to participate. VC for overall safety awareness        Exercises Other Exercises Other Exercises: OT facilitated ADL management as described above with ongoing education on safety, falls prevention, safe use of AE/DME, and assist t/o session.    Shoulder Instructions       General Comments      Pertinent Vitals/ Pain       Pain Assessment Pain Assessment: No/denies pain  Home Living                                          Prior Functioning/Environment              Frequency  Min 2X/week        Progress Toward Goals  OT Goals(current goals can now be found in the care plan section)  Progress towards OT goals: Progressing toward goals  Acute Rehab OT Goals Patient Stated Goal: to go home to take care of his wife OT Goal Formulation: With patient Time For Goal Achievement: 06/30/22 Potential to Achieve Goals: Good  Plan Discharge plan needs to be updated;Frequency remains appropriate    Co-evaluation                 AM-PAC OT "6 Clicks" Daily Activity     Outcome Measure   Help from another person eating meals?: None Help from another person taking care of personal grooming?: A Little Help from another person toileting, which includes using toliet, bedpan, or urinal?: A Lot Help from another person bathing (including washing, rinsing, drying)?: A Lot Help from another person to  put on and taking off regular upper body clothing?: A Little Help from another person to put on and taking off regular lower body clothing?: A Lot 6 Click Score: 16    End of Session Equipment Utilized During Treatment: Gait belt;Rolling walker (2 wheels)  OT Visit Diagnosis: Unsteadiness on feet (R26.81);Muscle weakness (generalized) (M62.81);History of falling (Z91.81);Pain   Activity Tolerance Patient tolerated treatment well;Patient limited by pain   Patient Left in bed;with call bell/phone within reach;with bed alarm set   Nurse Communication Mobility status;Patient requests pain meds        Time: 1251-1313 OT Time Calculation (min): 22 min  Charges: OT General Charges $OT Visit: 1 Visit OT Treatments $Self Care/Home Management : 8-22 mins  Shara Blazing, M.S., OTR/L 06/19/22, 1:58 PM

## 2022-06-19 NOTE — Progress Notes (Signed)
Physical Therapy Treatment Patient Details Name: Justin Griffith MRN: [id number] DOB: 25-Jan-1941 Today's Date: 06/19/2022  History of Present Illness Pt is an 82 yo male with PMH of dementia, HTN, HLD, COPD, DM, PE, chronic indwelling catheter. Admitted for  hematuria.    PT Comments    Patient wakes to touch, initially denied pain but with mobility exhibited and complained of back pain. Pt more challenged by mobility today; 2 person assist needed to complete sit <> stand transfers with RW, and despite many attempts he was unable to take any steps to transfer to recliner today. Several times the pt returned to sitting spontaneously and due to fatigue and pain exhibited L lateral lean. MaxAx2 to return to supine, all needs in reach. The patient would benefit from continued skilled PT intervention to return pt to PLOF as able.      Recommendations for follow up therapy are one component of a multi-disciplinary discharge planning process, led by the attending physician.  Recommendations may be updated based on patient status, additional functional criteria and insurance authorization.  Follow Up Recommendations  Can patient physically be transported by private vehicle: No    Assistance Recommended at Discharge Frequent or constant Supervision/Assistance  Patient can return home with the following A lot of help with walking and/or transfers;Help with stairs or ramp for entrance;Assist for transportation;Assistance with cooking/housework;A lot of help with bathing/dressing/bathroom   Equipment Recommendations  None recommended by PT    Recommendations for Other Services       Precautions / Restrictions Precautions Precautions: Fall Restrictions Weight Bearing Restrictions: No     Mobility  Bed Mobility Overal bed mobility: Needs Assistance Bed Mobility: Supine to Sit, Sit to Supine     Supine to sit: Max assist Sit to supine: +2 for physical assistance, Max assist         Transfers Overall transfer level: Needs assistance Equipment used: Rolling walker (2 wheels) Transfers: Sit to/from Stand Sit to Stand: Mod assist, +2 physical assistance           General transfer comment: attempted many times, unable to truly take a step today    Ambulation/Gait               General Gait Details: unable   Stairs             Wheelchair Mobility    Modified Rankin (Stroke Patients Only)       Balance Overall balance assessment: Needs assistance Sitting-balance support: Feet supported Sitting balance-Leahy Scale: Fair Sitting balance - Comments: with fatigue and low back pain, sitting balance intermittently poor     Standing balance-Leahy Scale: Poor                              Cognition Arousal/Alertness: Awake/alert Behavior During Therapy: Flat affect Overall Cognitive Status: History of cognitive impairments - at baseline                                 General Comments: Pt required encouragement to participate. VC for overall safety awareness        Exercises      General Comments        Pertinent Vitals/Pain Pain Assessment Pain Assessment: Faces Faces Pain Scale: Hurts little more Pain Location: back and buttocks with movement    Home Living  Prior Function            PT Goals (current goals can now be found in the care plan section) Progress towards PT goals: Progressing toward goals    Frequency    Min 2X/week      PT Plan Current plan remains appropriate    Co-evaluation              AM-PAC PT "6 Clicks" Mobility   Outcome Measure  Help needed turning from your back to your side while in a flat bed without using bedrails?: A Lot Help needed moving from lying on your back to sitting on the side of a flat bed without using bedrails?: A Lot Help needed moving to and from a bed to a chair (including a wheelchair)?: A Lot Help  needed standing up from a chair using your arms (e.g., wheelchair or bedside chair)?: A Lot Help needed to walk in hospital room?: Total Help needed climbing 3-5 steps with a railing? : Total 6 Click Score: 10    End of Session Equipment Utilized During Treatment: Gait belt Activity Tolerance: Patient limited by fatigue Patient left: in bed;with call bell/phone within reach;with nursing/sitter in room;with bed alarm set Nurse Communication: Mobility status PT Visit Diagnosis: Other abnormalities of gait and mobility (R26.89);History of falling (Z91.81)     Time: [id number] PT Time Calculation (min) (ACUTE ONLY): 21 min  Charges:  $Therapeutic Activity: 8-22 mins                     Lieutenant Diego PT, DPT 12:17 PM,06/19/22

## 2022-06-20 DIAGNOSIS — R339 Retention of urine, unspecified: Secondary | ICD-10-CM | POA: Diagnosis not present

## 2022-06-20 LAB — BASIC METABOLIC PANEL
Anion gap: 8 (ref 5–15)
BUN: 53 mg/dL — ABNORMAL HIGH (ref 8–23)
CO2: 27 mmol/L (ref 22–32)
Calcium: 9.4 mg/dL (ref 8.9–10.3)
Chloride: 98 mmol/L (ref 98–111)
Creatinine, Ser: 1.52 mg/dL — ABNORMAL HIGH (ref 0.61–1.24)
GFR, Estimated: 45 mL/min — ABNORMAL LOW (ref >60)
Glucose, Bld: 295 mg/dL — ABNORMAL HIGH (ref 70–99)
Potassium: 4.4 mmol/L (ref 3.5–5.1)
Sodium: 133 mmol/L — ABNORMAL LOW (ref 135–145)

## 2022-06-20 LAB — CBC
HCT: 26.9 % — ABNORMAL LOW (ref 39.0–52.0)
Hemoglobin: 8.4 g/dL — ABNORMAL LOW (ref 13.0–17.0)
MCH: 26 pg (ref 26.0–34.0)
MCHC: 31.2 g/dL (ref 30.0–36.0)
MCV: 83.3 fL (ref 80.0–100.0)
Platelets: 274 10*3/uL (ref 150–400)
RBC: 3.23 MIL/uL — ABNORMAL LOW (ref 4.22–5.81)
RDW: 14.4 % (ref 11.5–15.5)
WBC: 6.2 10*3/uL (ref 4.0–10.5)
nRBC: 0 % (ref 0.0–0.2)

## 2022-06-20 LAB — MAGNESIUM: Magnesium: 2.3 mg/dL (ref 1.7–2.4)

## 2022-06-20 LAB — GLUCOSE, CAPILLARY
Glucose-Capillary: 322 mg/dL — ABNORMAL HIGH (ref 70–99)
Glucose-Capillary: 229 mg/dL — ABNORMAL HIGH (ref 70–99)
Glucose-Capillary: 250 mg/dL — ABNORMAL HIGH (ref 70–99)
Glucose-Capillary: 222 mg/dL — ABNORMAL HIGH (ref 70–99)

## 2022-06-20 NOTE — Progress Notes (Signed)
[facility] Progress Note  Patient: Justin Griffith    [id number]  DOA: 06/13/2022     Date of Service: the patient was seen and examined on 06/20/2022  Chief Complaint  Patient presents with   catheter issues   Brief hospital course: KAYD BEVERIDGE is a 82 y.o. male with medical history significant of hypertension, hyperlipidemia, diabetes mellitus, COPD/asthma, stroke, depression, OSA, colon cancer, BPH with urinary retention with chronic indwelling catheter placement, PE on Eliquis, obesity, CAD, CABG, CKD-3B, dCHF, PAD, chronic indwelling Foley cath, who presents to ED due to Foley cath not draining.  He has some gross hematuria and Foley catheter was advanced.  Patient has been seen by urology, apparently patient had recurrent obstruction with Foley catheter with blood clots.  Anticoagulation was discontinued.  Patient is also seen by vascular surgery.  Not planning for IVC filter.  Patient has completed 6 months of anticoagulation for his a first episode of PE. Anticoagulation is on hold.  Hematuria is improving.  Patient was also started on treated with Rocephin, urine culture came back without growth, urology still want antibiotics due to obstruction.       Principal Problem:   Urinary retention Active Problems:   BPH with obstruction/lower urinary tract symptoms Foley dependent   Gross hematuria   UTI (urinary tract infection)   Acute renal failure superimposed on stage 3b chronic kidney disease (HCC)   CAD (coronary artery disease)   Chronic obstructive pulmonary disease (COPD) (HCC)   Chronic diastolic CHF (congestive heart failure) (HCC)   Pulmonary embolism (HCC)   History of pulmonary embolism 09/18/21   HTN (hypertension)   HLD (hyperlipidemia)   Stage 3b chronic kidney disease (CKD) (HCC)   Uncontrolled type 2 diabetes mellitus with hyperglycemia, with long-term current use of insulin (HCC)   History of CVA (cerebrovascular accident)   Sleep apnea   Obesity (BMI  30-39.9)   Senile dementia with delirium ([place])   Pressure injury of skin   B12 deficiency anemia    Assessment and Plan: Urinary retention due to gross hematuria and BPH with obstruction/lower urinary tract symptoms Foley dependent Possible urinary tract infection with hematuria. Patient condition was better, patient did well with voiding trial, Foley catheter was removed.  However, patient has severe penile pain afterwards.  Patient treated with pain medicine, pyridium.   Pyridium discontinued due to worsening renal function. Penile pain is better, continue oral antibiotics for another 4 days.   Vitamin B12 deficient anemia. Previously, patient had lowl B12 level, current B12 level borderline.  Restarted B12 supplement. Hemoglobin is low but stable.   Hyponatremia. Acute kidney injury on chronic kidney disease stage IIIb. Cr 1.7--1.52 Renal function improving s/p IVF renal ultrasound did not show any obstruction, showed medical kidney disease.  Continue salt tablets for hyponatremia.     History of pulmonary embolism 09/18/21 Anticoagulation on hold due to active bleeding.  Per vascular surgery, patient was supposed to be taking Eliquis for 6 months, which has completed.  However, patient does have moderate to high risk for recurrent thrombosis.  Discussed with patient wife, will continue hold anticoagulation. Since Foley catheter is removed, may consider restart anticoagulation in a week or 2.   Dementia with delirium. Condition stable.   Essential hypertension Continue home medicine.   CAD (coronary artery disease):  -Continue Crestor   Chronic obstructive pulmonary disease (COPD) Stable.   Type II diabetes mellitus with renal manifestations  Uncontrolled type 2 diabetes with hyperglycemia. Hemoglobin A1c 11.5 on  06/02/2022.  Insulin dose increased again.   HLD (hyperlipidemia) - Crestor    Chronic diastolic CHF (congestive heart failure) ([place]): 2D echo on 09/20/2021  showed EF of 60 to 65%.   Patient euvolemic, no exacerbation. Torsemide is restarted, patient renal function has improved.   History of CVA (cerebrovascular accident) -  Crestor   Obesity with body mass index (BMI) of 30.0 to 39.9: Body weight 113.1 kg, BMI 35.77  -Diet and exercise.       Pressure Injury 06/13/22 Coccyx Stage 1 -  Intact skin with non-blanchable redness of a localized area usually over a bony prominence. (Active)  06/13/22 1800  Location: Coccyx  Location Orientation:   Staging: Stage 1 -  Intact skin with non-blanchable redness of a localized area usually over a bony prominence.  Wound Description (Comments):   Present on Admission: Yes  Dressing Type Foam - Lift dressing to assess site every shift 06/20/22 0920     Diet: Carb modified diet DVT Prophylaxis: SCDs  Advance goals of care discussion: Full code  Family Communication: family was not present at bedside, at the time of interview.  The pt provided permission to discuss medical plan with the family. Opportunity was given to ask question and all questions were answered satisfactorily.   Disposition:  Pt is from Home, admitted with hematuria, urine retention, BPH, stable to discharge.  Discharge to SNF versus home, when bed available, TOC is working for placement otherwise discharge home on Monday.  Subjective: No significant events overnight, patient is still complaining of off-and-on dysuria, no urinary bleeding.  Denies any chest pain or palpitation, no shortness of breath, no abdominal pain.  Physical Exam: General: NAD, lying comfortably Appear in no distress, affect appropriate Eyes: PERRLA ENT: Oral Mucosa Clear, moist  Neck: no JVD,  Cardiovascular: S1 and S2 Present, no Murmur,  Respiratory: good respiratory effort, Bilateral Air entry equal and Decreased, no Crackles, no wheezes Abdomen: Bowel Sound present, Soft and no tenderness,  Skin: no rashes Extremities: no Pedal edema, no calf  tenderness Neurologic: without any new focal findings Gait not checked due to patient safety concerns  Vitals:   06/19/22 1627 06/20/22 0009 06/20/22 0500 06/20/22 0748  BP: 133/68 129/69  (!) 126/56  Pulse: (!) 53 60  95  Resp: 18 18  15   Temp: 98.2 F (36.8 C) 98.5 F (36.9 C)  97.8 F (36.6 C)  TempSrc:      SpO2: 96% 98%  97%  Weight:   112.4 kg   Height:        Intake/Output Summary (Last 24 hours) at 06/20/2022 1452 Last data filed at 06/20/2022 1420 Gross per 24 hour  Intake 780 ml  Output 2650 ml  Net -1870 ml   Filed Weights   06/18/22 0500 06/19/22 0500 06/20/22 0500  Weight: 114.5 kg 114.8 kg 112.4 kg    Data Reviewed: I have personally reviewed and interpreted daily labs, tele strips, imagings as discussed above. I reviewed all nursing notes, pharmacy notes, vitals, pertinent old records I have discussed plan of care as described above with RN and patient/family.  CBC: Recent Labs  Lab 06/13/22 1454 06/14/22 0415 06/16/22 0624 06/17/22 0614 06/18/22 0504 06/19/22 0517 06/20/22 0604  WBC 11.5*   < > 9.8 8.5 7.2 7.1 6.2  NEUTROABS 9.1*  --   --   --   --   --   --   HGB 11.3*   < > 9.1* 8.7* 8.0* 7.9* 8.4*  HCT 35.7*   < > 28.6* 27.3* 26.0* 25.0* 26.9*  MCV 83.4   < > 82.4 82.7 84.7 84.5 83.3  PLT 335   < > 291 256 258 265 274   < > = values in this interval not displayed.   Basic Metabolic Panel: Recent Labs  Lab 06/16/22 0624 06/16/22 1106 06/17/22 0614 06/18/22 0504 06/19/22 0517 06/20/22 0604  NA 126* 128* 127* 132* 132* 133*  K 3.9 4.2 3.7 4.3 4.5 4.4  CL 93* 87* 93* 95* 99 98  CO2 25 26 25 28 27 27   GLUCOSE 249* 301* 236* 302* 272* 295*  BUN 45* 50* 58* 66* 56* 53*  CREATININE 2.08* 1.95* 2.13* 2.05* 1.70* 1.52*  CALCIUM 8.8* 9.5 8.9 8.8* 9.1 9.4  MG 1.9  --   --  2.3  --  2.3    Studies: No results found.  Scheduled Meds:  cephALEXin  500 mg Oral Q8H   cloNIDine  0.1 mg Oral BID   vitamin B-12  1,000 mcg Oral Daily    gabapentin  400 mg Oral TID   hydrALAZINE  100 mg Oral TID   insulin aspart  0-5 Units Subcutaneous QHS   insulin aspart  0-9 Units Subcutaneous TID WC   insulin aspart  8 Units Subcutaneous TID WC   insulin detemir  30 Units Subcutaneous BID   losartan  100 mg Oral Daily   multivitamin with minerals  1 tablet Oral Daily   nystatin   Topical BID   pantoprazole  40 mg Oral Daily   rosuvastatin  10 mg Oral QHS   simethicone  160 mg Oral Once   sodium chloride  1 g Oral BID WC   torsemide  40 mg Oral Daily   umeclidinium bromide  1 puff Inhalation Daily   Continuous Infusions: PRN Meds: acetaminophen, albuterol, dextromethorphan-guaiFENesin, hydrALAZINE, olopatadine, ondansetron (ZOFRAN) IV, oxyCODONE-acetaminophen, polyethylene glycol powder, simethicone  Time spent: 35 minutes  Author: Val Riles. MD [facility] 06/20/2022 2:52 PM  To reach On-call, see care teams to locate the attending and reach out to them via [email or web address]. If 7PM-7AM, please contact night-coverage If you still have difficulty reaching the attending provider, please page the [facility] (Director on Call) for [facility] on amion for assistance.

## 2022-06-20 NOTE — Plan of Care (Signed)
  Problem: Health Behavior/Discharge Planning: Goal: Ability to manage health-related needs will improve Outcome: Progressing   Problem: Clinical Measurements: Goal: Diagnostic test results will improve Outcome: Progressing Goal: Cardiovascular complication will be avoided Outcome: Progressing   Problem: Activity: Goal: Risk for activity intolerance will decrease Outcome: Progressing   Problem: Nutrition: Goal: Adequate nutrition will be maintained Outcome: Progressing   Problem: Elimination: Goal: Will not experience complications related to bowel motility Outcome: Progressing Goal: Will not experience complications related to urinary retention Outcome: Progressing

## 2022-06-21 DIAGNOSIS — R339 Retention of urine, unspecified: Secondary | ICD-10-CM | POA: Diagnosis not present

## 2022-06-21 LAB — CBC
HCT: 27 % — ABNORMAL LOW (ref 39.0–52.0)
Hemoglobin: 8.3 g/dL — ABNORMAL LOW (ref 13.0–17.0)
MCH: 26 pg (ref 26.0–34.0)
MCHC: 30.7 g/dL (ref 30.0–36.0)
MCV: 84.6 fL (ref 80.0–100.0)
Platelets: 298 10*3/uL (ref 150–400)
RBC: 3.19 MIL/uL — ABNORMAL LOW (ref 4.22–5.81)
RDW: 14.4 % (ref 11.5–15.5)
WBC: 7 10*3/uL (ref 4.0–10.5)
nRBC: 0 % (ref 0.0–0.2)

## 2022-06-21 LAB — MAGNESIUM: Magnesium: 2.1 mg/dL (ref 1.7–2.4)

## 2022-06-21 LAB — BASIC METABOLIC PANEL
Anion gap: 8 (ref 5–15)
BUN: 52 mg/dL — ABNORMAL HIGH (ref 8–23)
CO2: 29 mmol/L (ref 22–32)
Calcium: 9.6 mg/dL (ref 8.9–10.3)
Chloride: 98 mmol/L (ref 98–111)
Creatinine, Ser: 1.5 mg/dL — ABNORMAL HIGH (ref 0.61–1.24)
GFR, Estimated: 46 mL/min — ABNORMAL LOW (ref >60)
Glucose, Bld: 198 mg/dL — ABNORMAL HIGH (ref 70–99)
Potassium: 4.4 mmol/L (ref 3.5–5.1)
Sodium: 135 mmol/L (ref 135–145)

## 2022-06-21 LAB — PHOSPHORUS: Phosphorus: 3.7 mg/dL (ref 2.5–4.6)

## 2022-06-21 LAB — GLUCOSE, CAPILLARY
Glucose-Capillary: 206 mg/dL — ABNORMAL HIGH (ref 70–99)
Glucose-Capillary: 243 mg/dL — ABNORMAL HIGH (ref 70–99)
Glucose-Capillary: 190 mg/dL — ABNORMAL HIGH (ref 70–99)
Glucose-Capillary: 206 mg/dL — ABNORMAL HIGH (ref 70–99)

## 2022-06-21 NOTE — Progress Notes (Signed)
[facility] Progress Note  Patient: Justin Griffith    [id number]  DOA: 06/13/2022     Date of Service: the patient was seen and examined on 06/21/2022  Chief Complaint  Patient presents with   catheter issues   Brief hospital course: Justin Griffith is a 82 y.o. male with medical history significant of hypertension, hyperlipidemia, diabetes mellitus, COPD/asthma, stroke, depression, OSA, colon cancer, BPH with urinary retention with chronic indwelling catheter placement, PE on Eliquis, obesity, CAD, CABG, CKD-3B, dCHF, PAD, chronic indwelling Foley cath, who presents to ED due to Foley cath not draining.  He has some gross hematuria and Foley catheter was advanced.  Patient has been seen by urology, apparently patient had recurrent obstruction with Foley catheter with blood clots.  Anticoagulation was discontinued.  Patient is also seen by vascular surgery.  Not planning for IVC filter.  Patient has completed 6 months of anticoagulation for his a first episode of PE. Anticoagulation is on hold.  Hematuria is improving.  Patient was also started on treated with Rocephin, urine culture came back without growth, urology still want antibiotics due to obstruction.       Principal Problem:   Urinary retention Active Problems:   BPH with obstruction/lower urinary tract symptoms Foley dependent   Gross hematuria   UTI (urinary tract infection)   Acute renal failure superimposed on stage 3b chronic kidney disease (HCC)   CAD (coronary artery disease)   Chronic obstructive pulmonary disease (COPD) (HCC)   Chronic diastolic CHF (congestive heart failure) (HCC)   Pulmonary embolism (HCC)   History of pulmonary embolism 09/18/21   HTN (hypertension)   HLD (hyperlipidemia)   Stage 3b chronic kidney disease (CKD) (HCC)   Uncontrolled type 2 diabetes mellitus with hyperglycemia, with long-term current use of insulin (HCC)   History of CVA (cerebrovascular accident)   Sleep apnea   Obesity (BMI  30-39.9)   Senile dementia with delirium ([place])   Pressure injury of skin   B12 deficiency anemia    Assessment and Plan: Urinary retention due to gross hematuria and BPH with obstruction/lower urinary tract symptoms Foley dependent Possible urinary tract infection with hematuria. Patient condition was better, patient did well with voiding trial, Foley catheter was removed.  However, patient has severe penile pain afterwards.  Patient treated with pain medicine, pyridium.   Pyridium discontinued due to worsening renal function. Penile pain is better, continue oral antibiotics for another 4 days.   Vitamin B12 deficient anemia. Previously, patient had lowl B12 level, current B12 level borderline.  Restarted B12 supplement. Hemoglobin is low but stable.   Hyponatremia. Acute kidney injury on chronic kidney disease stage IIIb. Cr 1.7--1.50 Renal function improving s/p IVF renal ultrasound did not show any obstruction, showed medical kidney disease.   Na 126--135 sodium level improving Continue salt tablets for hyponatremia.     History of pulmonary embolism 09/18/21 Anticoagulation on hold due to active bleeding.  Per vascular surgery, patient was supposed to be taking Eliquis for 6 months, which has completed.  However, patient does have moderate to high risk for recurrent thrombosis.  Discussed with patient wife, will continue hold anticoagulation. Since Foley catheter is removed, may consider restart anticoagulation in a week or 2.  Patient was advised to follow with PCP and hematologist as an outpatient to restart anticoagulation if needed.   Dementia with delirium. Condition stable.   Essential hypertension Continue home medicine.   CAD (coronary artery disease):  -Continue Crestor   Chronic obstructive  pulmonary disease (COPD) Stable.   Type II diabetes mellitus with renal manifestations  Uncontrolled type 2 diabetes with hyperglycemia. Hemoglobin A1c 11.5 on 06/02/2022.   Insulin dose increased again.   HLD (hyperlipidemia) - Crestor    Chronic diastolic CHF (congestive heart failure) ([place]): 2D echo on 09/20/2021 showed EF of 60 to 65%.   Patient euvolemic, no exacerbation. Torsemide is restarted, patient renal function has improved.   History of CVA (cerebrovascular accident) -  Crestor   Obesity with body mass index (BMI) of 30.0 to 39.9: Body weight 113.1 kg, BMI 35.77  -Diet and exercise.       Pressure Injury 06/13/22 Coccyx Stage 1 -  Intact skin with non-blanchable redness of a localized area usually over a bony prominence. (Active)  06/13/22 1800  Location: Coccyx  Location Orientation:   Staging: Stage 1 -  Intact skin with non-blanchable redness of a localized area usually over a bony prominence.  Wound Description (Comments):   Present on Admission: Yes  Dressing Type Foam - Lift dressing to assess site every shift 06/21/22 0825     Diet: Carb modified diet DVT Prophylaxis: SCDs  Advance goals of care discussion: Full code  Family Communication: family was not present at bedside, at the time of interview.  The pt provided permission to discuss medical plan with the family. Opportunity was given to ask question and all questions were answered satisfactorily.   Disposition:  Pt is from Home, admitted with hematuria, urine retention, BPH, stable to discharge.  Discharge to SNF versus home, when bed available, TOC is working for placement otherwise discharge home on Monday.  Subjective: No significant events overnight, patient is hard of hearing, stated that dysuria is much better now, no hematuria.  Denies any other active issues.  Patient would like to go home tomorrow, does not want to go to rehab.   Physical Exam: General: NAD, lying comfortably Appear in no distress, affect appropriate Eyes: PERRLA ENT: Oral Mucosa Clear, moist  Neck: no JVD,  Cardiovascular: S1 and S2 Present, no Murmur,  Respiratory: good respiratory  effort, Bilateral Air entry equal and Decreased, no Crackles, no wheezes Abdomen: Bowel Sound present, Soft and no tenderness,  Skin: no rashes Extremities: no Pedal edema, no calf tenderness Neurologic: without any new focal findings Gait not checked due to patient safety concerns  Vitals:   06/20/22 1545 06/20/22 2152 06/21/22 0009 06/21/22 0810  BP: (!) 134/57 (!) 141/66 129/63 (!) 151/57  Pulse: 61 (!) 55 (!) 53 (!) 52  Resp: 16 17 18 17   Temp: (!) 97.3 F (36.3 C) 98.2 F (36.8 C) 98.5 F (36.9 C) 97.7 F (36.5 C)  TempSrc:      SpO2: 99% 99% 93% 95%  Weight:      Height:        Intake/Output Summary (Last 24 hours) at 06/21/2022 1443 Last data filed at 06/21/2022 1400 Gross per 24 hour  Intake 1260 ml  Output 1800 ml  Net -540 ml   Filed Weights   06/18/22 0500 06/19/22 0500 06/20/22 0500  Weight: 114.5 kg 114.8 kg 112.4 kg    Data Reviewed: I have personally reviewed and interpreted daily labs, tele strips, imagings as discussed above. I reviewed all nursing notes, pharmacy notes, vitals, pertinent old records I have discussed plan of care as described above with RN and patient/family.  CBC: Recent Labs  Lab 06/17/22 0614 06/18/22 0504 06/19/22 0517 06/20/22 0604 06/21/22 0546  WBC 8.5 7.2 7.1 6.2 7.0  HGB 8.7* 8.0* 7.9* 8.4* 8.3*  HCT 27.3* 26.0* 25.0* 26.9* 27.0*  MCV 82.7 84.7 84.5 83.3 84.6  PLT 256 258 265 274 [id number]   Basic Metabolic Panel: Recent Labs  Lab 06/16/22 0624 06/16/22 1106 06/17/22 0614 06/18/22 0504 06/19/22 0517 06/20/22 0604 06/21/22 0546  NA 126*   < > 127* 132* 132* 133* 135  K 3.9   < > 3.7 4.3 4.5 4.4 4.4  CL 93*   < > 93* 95* 99 98 98  CO2 25   < > 25 28 27 27 29   GLUCOSE 249*   < > 236* 302* 272* 295* 198*  BUN 45*   < > 58* 66* 56* 53* 52*  CREATININE 2.08*   < > 2.13* 2.05* 1.70* 1.52* 1.50*  CALCIUM 8.8*   < > 8.9 8.8* 9.1 9.4 9.6  MG 1.9  --   --  2.3  --  2.3 2.1  PHOS  --   --   --   --   --   --  3.7   < > =  values in this interval not displayed.    Studies: No results found.  Scheduled Meds:  cephALEXin  500 mg Oral Q8H   cloNIDine  0.1 mg Oral BID   vitamin B-12  1,000 mcg Oral Daily   gabapentin  400 mg Oral TID   hydrALAZINE  100 mg Oral TID   insulin aspart  0-5 Units Subcutaneous QHS   insulin aspart  0-9 Units Subcutaneous TID WC   insulin aspart  8 Units Subcutaneous TID WC   insulin detemir  30 Units Subcutaneous BID   losartan  100 mg Oral Daily   multivitamin with minerals  1 tablet Oral Daily   nystatin   Topical BID   pantoprazole  40 mg Oral Daily   rosuvastatin  10 mg Oral QHS   simethicone  160 mg Oral Once   sodium chloride  1 g Oral BID WC   torsemide  40 mg Oral Daily   umeclidinium bromide  1 puff Inhalation Daily   Continuous Infusions: PRN Meds: acetaminophen, albuterol, dextromethorphan-guaiFENesin, hydrALAZINE, olopatadine, ondansetron (ZOFRAN) IV, oxyCODONE-acetaminophen, polyethylene glycol powder, simethicone  Time spent: 35 minutes  Author: Val Riles. MD [facility] 06/21/2022 2:43 PM  To reach On-call, see care teams to locate the attending and reach out to them via [email or web address]. If 7PM-7AM, please contact night-coverage If you still have difficulty reaching the attending provider, please page the [facility] (Director on Call) for [facility] on amion for assistance.

## 2022-06-22 ENCOUNTER — Telehealth: Payer: PPO

## 2022-06-22 DIAGNOSIS — R339 Retention of urine, unspecified: Secondary | ICD-10-CM | POA: Diagnosis not present

## 2022-06-22 LAB — BASIC METABOLIC PANEL
Anion gap: 7 (ref 5–15)
BUN: 53 mg/dL — ABNORMAL HIGH (ref 8–23)
CO2: 29 mmol/L (ref 22–32)
Calcium: 9.3 mg/dL (ref 8.9–10.3)
Chloride: 96 mmol/L — ABNORMAL LOW (ref 98–111)
Creatinine, Ser: 1.52 mg/dL — ABNORMAL HIGH (ref 0.61–1.24)
GFR, Estimated: 45 mL/min — ABNORMAL LOW (ref >60)
Glucose, Bld: 277 mg/dL — ABNORMAL HIGH (ref 70–99)
Potassium: 4.5 mmol/L (ref 3.5–5.1)
Sodium: 132 mmol/L — ABNORMAL LOW (ref 135–145)

## 2022-06-22 LAB — CBC
HCT: 27.8 % — ABNORMAL LOW (ref 39.0–52.0)
Hemoglobin: 8.6 g/dL — ABNORMAL LOW (ref 13.0–17.0)
MCH: 26.1 pg (ref 26.0–34.0)
MCHC: 30.9 g/dL (ref 30.0–36.0)
MCV: 84.2 fL (ref 80.0–100.0)
Platelets: 280 10*3/uL (ref 150–400)
RBC: 3.3 MIL/uL — ABNORMAL LOW (ref 4.22–5.81)
RDW: 14.2 % (ref 11.5–15.5)
WBC: 6.4 10*3/uL (ref 4.0–10.5)
nRBC: 0 % (ref 0.0–0.2)

## 2022-06-22 LAB — PHOSPHORUS: Phosphorus: 3.8 mg/dL (ref 2.5–4.6)

## 2022-06-22 LAB — MAGNESIUM: Magnesium: 2.1 mg/dL (ref 1.7–2.4)

## 2022-06-22 LAB — GLUCOSE, CAPILLARY
Glucose-Capillary: 233 mg/dL — ABNORMAL HIGH (ref 70–99)
Glucose-Capillary: 185 mg/dL — ABNORMAL HIGH (ref 70–99)

## 2022-06-22 MED ORDER — CYANOCOBALAMIN 1000 MCG PO TABS: 1000.0000 ug | ORAL_TABLET | Freq: Every day | ORAL | 0 refills | Status: DC

## 2022-06-22 NOTE — Inpatient Diabetes Management (Signed)
Inpatient Diabetes Program Recommendations  AACE/ADA: New Consensus Statement on Inpatient Glycemic Control (2015)  Target Ranges:  Prepandial:   less than 140 mg/dL      Peak postprandial:   less than 180 mg/dL (1-2 hours)      Critically ill patients:  140 - 180 mg/dL   Lab Results  Component Value Date   GLUCAP 233 (H) 06/22/2022   HGBA1C 11.5 (H) 06/02/2022    Review of Glycemic Control  Latest Reference Range & Units 06/21/22 08:06 06/21/22 12:09 06/21/22 16:13 06/21/22 21:41 06/22/22 09:23  Glucose-Capillary 70 - 99 mg/dL 206 (H) 243 (H) 190 (H) 206 (H) 233 (H)  (H): Data is abnormally high  Diabetes history: DM2 Outpatient Diabetes medications: Rybelsus 14 mg QD, Metformin 500 mg BID Current orders for Inpatient glycemic control: Levemir 30 units QD, Novolog 0-9 units TID & 0-5 units QHS, Novolog 8 units TID with meals   Inpatient Diabetes Program Recommendations:    Levemir 32 units BID  Will continue to follow while inpatient.  Thank you, Reche Dixon, MSN, [place] Diabetes Coordinator Inpatient Diabetes Program [phone] (team pager from 8a-5p)

## 2022-06-22 NOTE — TOC Transition Note (Signed)
Transition of Care [facility]) - CM/SW Discharge Note   Patient Details  Name: Justin Griffith MRN: [id number] Date of Birth: [birth year]  [name] CM/SW Contact:  Candie Chroman, LCSW Phone Number: 06/22/2022, 1:17 PM   Clinical Narrative:   Patient has orders to discharge home today. Per MD, patient declined SNF. Patient was discharged from [facility] on 3/26 but they can accept him back for PT, OT, RN, aide. Patient left before CSW could update him. No further concerns. CSW signing off.  Final next level of care: [place] Barriers to Discharge: Barriers Resolved   Patient Goals and CMS Choice      Discharge Placement                      Patient and family notified of of transfer: 06/22/22  Discharge Plan and Services Additional resources added to the After Visit Summary for     Discharge Planning Services: CM Consult            DME Arranged: N/A DME Agency: NA       HH Arranged: RN, PT, OT, Nurse's Aide [place] Agency: [facility] Date [facility] Agency Contacted: 06/22/22   Representative spoke with at [place]: Juanda Crumble  Social Determinants of Health ([place]) Interventions SDOH Screenings   Food Insecurity: No Food Insecurity (06/13/2022)  Housing: Low Risk  (06/13/2022)  Transportation Needs: No Transportation Needs (06/13/2022)  Utilities: Not At Risk (06/13/2022)  Alcohol Screen: Low Risk  (03/03/2022)  Depression (PHQ2-9): High Risk (04/03/2022)  Financial Resource Strain: Low Risk  (03/03/2022)  Physical Activity: Inactive (03/03/2022)  Social Connections: Moderately Integrated (03/03/2022)  Stress: No Stress Concern Present (03/03/2022)  Tobacco Use: Medium Risk (06/13/2022)     Readmission Risk Interventions    06/17/2022    2:16 PM 11/24/2021   10:33 AM 09/21/2021   10:42 AM  Readmission Risk Prevention Plan  Transportation Screening Complete Complete Complete  PCP or Specialist Appt within 3-5 Days   Complete   HRI or [place]   Complete  Social Work Consult for [place] Planning/Counseling   Complete  Palliative Care Screening   Not Applicable  Medication Review Press photographer) Complete Complete Complete  PCP or Specialist appointment within 3-5 days of discharge Complete Complete   HRI or Home Care Consult Complete Complete   SW Recovery Care/Counseling Consult Complete Complete   Palliative Care Screening Not Applicable Not [place] Not Applicable Complete

## 2022-06-22 NOTE — Care Management Important Message (Signed)
Important Message  Patient Details  Name: Justin Griffith MRN: [id number] Date of Birth: 1940/12/22   Medicare Important Message Given:  Yes     Dannette Barbara 06/22/2022, 10:47 AM

## 2022-06-22 NOTE — Progress Notes (Signed)
Physical Therapy Treatment Patient Details Name: Justin Griffith MRN: [id number] DOB: 01/29/1941 Today's Date: 06/22/2022   History of Present Illness Pt is an 82 yo male with PMH of dementia, HTN, HLD, COPD, DM, PE, chronic indwelling catheter. Admitted for hematuria.    PT Comments    Pt was pleasant and motivated to participate during the session and put forth good effort throughout. Pt required heavy assist with cues for log roll sequencing for decreased caregiver assistance during bed mobility training.  Pt able to come to standing with min A from a significantly elevated EOB but during transfer training from the recliner pt required heavy mod A and cues for hand placement to come to standing and to prevent posterior LOB upon initial stand.  Once pt was up and steady he was able to amb 60 feet and ascend/descend two steps with bilateral rails with no LOB or physical assist needed.  Pt remains at an elevated risk for falls.  Pt will benefit from continued PT services upon discharge to safely address deficits listed in patient problem list for decreased caregiver assistance and eventual return to PLOF.     Recommendations for follow up therapy are one component of a multi-disciplinary discharge planning process, led by the attending physician.  Recommendations may be updated based on patient status, additional functional criteria and insurance authorization.  Follow Up Recommendations  Can patient physically be transported by private vehicle: No    Assistance Recommended at Discharge Frequent or constant Supervision/Assistance  Patient can return home with the following A lot of help with walking and/or transfers;Help with stairs or ramp for entrance;Assist for transportation;Assistance with cooking/housework;A lot of help with bathing/dressing/bathroom   Equipment Recommendations  None recommended by PT    Recommendations for Other Services       Precautions / Restrictions  Precautions Precautions: Fall Restrictions Weight Bearing Restrictions: No     Mobility  Bed Mobility Overal bed mobility: Needs Assistance Bed Mobility: Rolling, Sidelying to Sit Rolling: Mod assist Sidelying to sit: Mod assist       General bed mobility comments: Mod A for BLE and trunk control and cues for sequencing for use of the bed rail and for log roll technique    Transfers Overall transfer level: Needs assistance Equipment used: Rolling walker (2 wheels) Transfers: Sit to/from Stand Sit to Stand: Mod assist           General transfer comment: Mod A to come to full upright standing position and to prevent posterior LOB upon initial stand; mod verbal and tactile cues for hand placement    Ambulation/Gait Ambulation/Gait assistance: Min guard Gait Distance (Feet): 60 Feet Assistive device: Rolling walker (2 wheels) Gait Pattern/deviations: Step-through pattern, Decreased step length - right, Decreased step length - left, Trunk flexed Gait velocity: decreased     General Gait Details: Slow cadence but steady without LOB   Stairs Stairs: Yes Stairs assistance: Min guard, +2 safety/equipment Stair Management: Two rails Number of Stairs: 2 General stair comments: Pt able to ascend/descend 2 steps with B rails with fair eccentric and concentric control and stability; per pt report, has entry into home with 2 steps and narrow bilateral rails   Wheelchair Mobility    Modified Rankin (Stroke Patients Only)       Balance Overall balance assessment: Needs assistance Sitting-balance support: Feet supported, Single extremity supported Sitting balance-Leahy Scale: Good     Standing balance support: Bilateral upper extremity supported, During functional activity, Reliant on assistive device  for balance Standing balance-Leahy Scale: Fair                              Cognition Arousal/Alertness: Awake/alert Behavior During Therapy: WFL for tasks  assessed/performed Overall Cognitive Status: History of cognitive impairments - at baseline                                          Exercises      General Comments        Pertinent Vitals/Pain Pain Assessment Pain Assessment: No/denies pain    Home Living                          Prior Function            PT Goals (current goals can now be found in the care plan section) Progress towards PT goals: Progressing toward goals    Frequency    Min 2X/week      PT Plan Current plan remains appropriate    Co-evaluation              AM-PAC PT "6 Clicks" Mobility   Outcome Measure  Help needed turning from your back to your side while in a flat bed without using bedrails?: A Lot Help needed moving from lying on your back to sitting on the side of a flat bed without using bedrails?: A Lot Help needed moving to and from a bed to a chair (including a wheelchair)?: A Lot Help needed standing up from a chair using your arms (e.g., wheelchair or bedside chair)?: A Lot Help needed to walk in hospital room?: A Little Help needed climbing 3-5 steps with a railing? : A Little 6 Click Score: 14    End of Session Equipment Utilized During Treatment: Gait belt Activity Tolerance: Patient tolerated treatment well Patient left: with call bell/phone within reach;in chair;with chair alarm set Nurse Communication: Mobility status PT Visit Diagnosis: Other abnormalities of gait and mobility (R26.89);History of falling (Z91.81)     Time: [id number] PT Time Calculation (min) (ACUTE ONLY): 27 min  Charges:  $Gait Training: 23-37 mins                     D. Scott Jessamyn Watterson PT, DPT 06/22/22, 12:13 PM

## 2022-06-22 NOTE — Progress Notes (Signed)
Occupational Therapy Treatment Patient Details Name: Justin Griffith MRN: [id number] DOB: [birth year] Today's Date: 06/22/2022   History of present illness Pt is an 82 yo male with PMH of dementia, HTN, HLD, COPD, DM, PE, chronic indwelling catheter. Admitted for hematuria.   OT comments  Patient received sitting up in chair and agreeable to OT. Tx session targeted improving tolerance for functional mobility in the setting of ADL tasks. Pt required Min A to stand from recliner and toilet this date (use of BSC frame). He was able to complete functional mobility to the bathroom with Min guard for safety and use of RW. Pt then stood at the sink to complete hand hygiene before returning to chair. Pt left as received with all needs in reach. Pt is making progress toward goal completion. D/C recommendation remains appropriate. OT will continue to follow acutely.    Recommendations for follow up therapy are one component of a multi-disciplinary discharge planning process, led by the attending physician.  Recommendations may be updated based on patient status, additional functional criteria and insurance authorization.    Assistance Recommended at Discharge Frequent or constant Supervision/Assistance  Patient can return home with the following  Assistance with cooking/housework;Assist for transportation;Help with stairs or ramp for entrance;A little help with walking and/or transfers;A lot of help with bathing/dressing/bathroom   Equipment Recommendations  None recommended by OT    Recommendations for Other Services      Precautions / Restrictions Precautions Precautions: Fall Restrictions Weight Bearing Restrictions: No       Mobility Bed Mobility       General bed mobility comments: pt received/left in recliner    Transfers Overall transfer level: Needs assistance Equipment used: Rolling walker (2 wheels) Transfers: Sit to/from Stand Sit to Stand: Min assist                  Balance Overall balance assessment: Needs assistance Sitting-balance support: Feet supported, Single extremity supported Sitting balance-Leahy Scale: Good     Standing balance support: Bilateral upper extremity supported, During functional activity, Single extremity supported Standing balance-Leahy Scale: Fair         ADL either performed or assessed with clinical judgement   ADL Overall ADL's : Needs assistance/impaired     Grooming: Standing;Min guard;Wash/dry Product manager (2 wheels);BSC/3in1;Regular Toilet;Minimal Print production planner Details (indicate cue type and reason): BSC frame placed over toilet         Functional mobility during ADLs: Rolling walker (2 wheels);Min guard (~12ft to the bathroom)      Extremity/Trunk Assessment Upper Extremity Assessment Upper Extremity Assessment: Generalized weakness   Lower Extremity Assessment Lower Extremity Assessment: Generalized weakness   Cervical / Trunk Assessment Cervical / Trunk Assessment: Normal    Vision Patient Visual Report: No change from baseline     Perception     Praxis      Cognition Arousal/Alertness: Awake/alert Behavior During Therapy: WFL for tasks assessed/performed Overall Cognitive Status: History of cognitive impairments - at baseline           General Comments: Pt required encouragement to participate. VC for overall safety awareness        Exercises      Shoulder Instructions       General Comments      Pertinent Vitals/ Pain       Pain Assessment Pain Assessment: No/denies pain  Home Living            Prior Functioning/Environment              Frequency  Min 2X/week        Progress Toward Goals  OT Goals(current goals can now be found in the care plan section)  Progress towards OT goals: Progressing toward goals  Acute Rehab OT Goals Patient Stated Goal: to go home and take care of his  wife OT Goal Formulation: With patient Time For Goal Achievement: 06/30/22 Potential to Achieve Goals: Good  Plan Discharge plan remains appropriate;Frequency remains appropriate    Co-evaluation                 AM-PAC OT "6 Clicks" Daily Activity     Outcome Measure   Help from another person eating meals?: None Help from another person taking care of personal grooming?: A Little Help from another person toileting, which includes using toliet, bedpan, or urinal?: A Little Help from another person bathing (including washing, rinsing, drying)?: A Lot Help from another person to put on and taking off regular upper body clothing?: A Little Help from another person to put on and taking off regular lower body clothing?: A Lot 6 Click Score: 17    End of Session Equipment Utilized During Treatment: Gait belt;Rolling walker (2 wheels)  OT Visit Diagnosis: Unsteadiness on feet (R26.81);Muscle weakness (generalized) (M62.81);History of falling (Z91.81);Pain   Activity Tolerance Patient tolerated treatment well   Patient Left in bed;with call bell/phone within reach;with bed alarm set   Nurse Communication Mobility status        Time: 1210-1220 OT Time Calculation (min): 10 min  Charges: OT General Charges $OT Visit: 1 Visit OT Treatments $Self Care/Home Management : 8-22 mins  [facility] MS, OTR/L ascom [phone]  06/22/22, 5:14 PM

## 2022-06-22 NOTE — Plan of Care (Signed)
  Problem: Clinical Measurements: Goal: Diagnostic test results will improve Outcome: Progressing Goal: Cardiovascular complication will be avoided Outcome: Progressing   Problem: Activity: Goal: Risk for activity intolerance will decrease Outcome: Progressing   Problem: Nutrition: Goal: Adequate nutrition will be maintained Outcome: Progressing   Problem: Elimination: Goal: Will not experience complications related to bowel motility Outcome: Progressing Goal: Will not experience complications related to urinary retention Outcome: Progressing   Problem: Pain Managment: Goal: General experience of comfort will improve Outcome: Progressing

## 2022-06-22 NOTE — Discharge Summary (Signed)
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   Gross hematuria   UTI (urinary tract infection)   Acute renal failure superimposed on stage 3b chronic kidney disease   CAD (coronary artery disease)   Chronic obstructive pulmonary disease (COPD)   Chronic diastolic CHF (congestive heart failure)   Pulmonary embolism   History of pulmonary embolism 09/18/21   HTN (hypertension)   HLD (hyperlipidemia)   Stage 3b chronic kidney disease (CKD)   Uncontrolled type 2 diabetes mellitus with hyperglycemia, with long-term current use of insulin   History of CVA (cerebrovascular accident)   Sleep apnea   Obesity (BMI 30-39.9)   Senile dementia with delirium   Pressure injury of skin   B12 deficiency anemia   Admitted From: Home Disposition:  Home with [place] services, Refuse SNF placement   Recommendations for Outpatient Follow-up:  Follow-up with PCP in 1 week, repeat CBC and BMP after 1 week.  Need to follow-up on anticoagulation for history of pulmonary embolism.  Patient already took Eliquis for more than 6 months.  Need further evaluation whether he needs to continue long-term anticoagulation, may benefit from follow-up with hematologist. Follow-up with urology, pt already has an appt Follow up LABS/TEST:  CBC and BMP in 1 wk   Diet recommendation: Cardiac diet  Activity: The patient is advised to gradually reintroduce usual activities, as tolerated  Discharge Condition: stable  Code Status: Full code   History of present illness: As per the H and P dictated on admission  Hospital Course:  Justin Griffith is a 82 y.o. male with medical history significant of hypertension, hyperlipidemia,  diabetes mellitus, COPD/asthma, stroke, depression, OSA, colon cancer, BPH with urinary retention with chronic indwelling catheter placement, PE on Eliquis, obesity, CAD, CABG, CKD-3B, dCHF, PAD, chronic indwelling Foley cath, who presents to ED due to Foley cath not draining.  He has some gross hematuria and Foley catheter was advanced.  Patient has been seen by urology, apparently patient had recurrent obstruction with Foley catheter with blood clots.  Anticoagulation was discontinued.  Patient is also seen by vascular surgery.  Not planning for IVC filter.  Patient has completed 6 months of anticoagulation for his a first episode of PE. Anticoagulation is on hold.  Hematuria is improving.  Patient was also started on treated with Rocephin, urine culture came back without growth, urology still want antibiotics due to obstruction.  Assessment and Plan: # Urinary retention due to gross hematuria and BPH with   obstruction/lower urinary tract symptoms Foley dependent Possible urinary tract infection with hematuria. Patient condition was better, patient did well with voiding trial, Foley catheter was removed.  However, patient has severe penile pain afterwards.  Patient treated with pain medicine, pyridium.   Pyridium discontinued due to worsening renal function. Penile pain is better, s/p oral antibiotics.  Completed course, no need of more antibiotics.  Dysuria and hematuria resolved.  Follow with urology in 1 to 2 weeks. # Vitamin B12 deficient anemia: Previously, patient had lowl B12 level, current B12 level borderline.  Restarted B12 supplement. Hemoglobin is low but stable. # Hyponatremia, monitor, repeat BMP after 1 week # Acute kidney injury on chronic kidney disease stage IIIb. Cr 1.7--1.52 Renal  function improving s/p IVF renal ultrasound did not show any obstruction, showed medical kidney disease.  Na 126--135--132 improved but fluctuating, s/p salt tablets for hyponatremia.  Repeat BMP after 1  week # History of pulmonary embolism 09/18/21: Anticoagulation on hold due to active bleeding.  Per vascular surgery, patient was supposed to be taking Eliquis for 6 months, which has completed.  However, patient does have moderate to high risk for recurrent thrombosis.  Discussed with patient wife, will continue hold anticoagulation. Since Foley catheter is removed, may consider restart anticoagulation in a week or 2.  Patient was advised to follow with PCP and hematologist as an outpatient to restart anticoagulation if needed. # Dementia with delirium. Condition stable. # Essential hypertension, Continue home medicine. # CAD (coronary artery disease): Continue Crestor # Chronic obstructive pulmonary disease (COPD), Stable. # Type II diabetes mellitus with renal manifestations  # Uncontrolled type 2 diabetes with hyperglycemia: Hemoglobin A1c 11.5 on 06/02/2022. Insulin dose increased during hospital stay.  Patient was advised to follow with PCP, resumed home regimen, continue diabetic diet and monitor CBG. # HLD (hyperlipidemia), continue Crestor  # Chronic diastolic CHF (congestive heart failure) ([place]): 2D echo on 09/20/2021 showed EF of 60 to 65%.  Patient euvolemic, no exacerbation. Torsemide is restarted, patient renal function has improved. # History of CVA (cerebrovascular accident), on Crestor # Obesity, Body mass index is 35.56 kg/m.  Nutrition Interventions: Diet control and exercise.     Pressure Injury 06/13/22 Coccyx Stage 1 -  Intact skin with non-blanchable redness of a localized area usually over a bony prominence. (Active)  06/13/22 1800  Location: Coccyx  Location Orientation:   Staging: Stage 1 -  Intact skin with non-blanchable redness of a localized area usually over a bony prominence.  Wound Description (Comments):   Present on Admission: Yes  Dressing Type Foam - Lift dressing to assess site every shift 06/21/22 2200     Patient was seen by physical therapy, who  recommended Therapy, SNF placement but patient refused.  On the day of the discharge the patient's vitals were stable, and no other acute medical condition were reported by patient. the patient was felt safe to be discharge at Home with Home health.  Consultants: Urologist Procedures: None  Discharge Exam: General: Appear in no distress, no Rash; Oral Mucosa Clear, moist. Cardiovascular: S1 and S2 Present, no Murmur, Respiratory: normal respiratory effort, Bilateral Air entry present and no Crackles, no wheezes Abdomen: Bowel Sound present, Soft and no tenderness, no hernia Extremities: no Pedal edema, no calf tenderness Neurology: alert and oriented to time, place, and person affect appropriate.  Filed Weights   06/18/22 0500 06/19/22 0500 06/20/22 0500  Weight: 114.5 kg 114.8 kg 112.4 kg   Vitals:   06/22/22 0715 06/22/22 0733  BP: (!) 156/68 (!) 157/67  Pulse: (!) 54 64  Resp: 17   Temp: 98.4 F (36.9 C)   SpO2: 98%     DISCHARGE MEDICATION: Allergies as of 06/22/2022       Reactions   Farxiga [dapagliflozin] Rash   Causes a severe rash in groin area   Dulaglutide Diarrhea, Other (See Comments)   Liraglutide Diarrhea, Other (See Comments)   Jardiance [empagliflozin] Rash        Medication List     TAKE these medications    acyclovir 400 MG tablet Commonly known as: ZOVIRAX Take 1 tablet (400 mg total) by mouth 2 (two) times daily.   apixaban 2.5 MG Tabs tablet Commonly known as:  ELIQUIS Take 1 tablet (2.5 mg total) by mouth 2 (two) times daily. F/u PCP for blood thinner, may resume if needs for long term? What changed: additional instructions   blood glucose meter kit and supplies 1 each by Other route daily. Dispense based on patient and insurance preference. Use up to four times daily as directed. (FOR ICD-10 E10.9, E11.9).   cloNIDine 0.1 MG tablet Commonly known as: CATAPRES Take 1 tablet (0.1 mg total) by mouth 2 (two) times daily. For blood  pressure   cyanocobalamin 1000 MCG tablet Take 1 tablet (1,000 mcg total) by mouth daily. Start taking on: June 23, 2022   diclofenac Sodium 1 % Gel Commonly known as: Voltaren Apply 4 g topically 4 (four) times daily.   gabapentin 400 MG capsule Commonly known as: NEURONTIN Take 1 capsule (400 mg total) by mouth 3 (three) times daily. For nerve pain and itching What changed: how much to take   GNP Vitamin C 500 MG tablet Generic drug: ascorbic acid TAKE 1 TABLET BY MOUTH ONCE DAILY TO KEEP YOUR IMMUNE SYSTEM UP   hydrALAZINE 100 MG tablet Commonly known as: APRESOLINE Take 1 tablet (100 mg total) by mouth 3 (three) times daily. for blood pressure   Incruse Ellipta 62.5 MCG/ACT Aepb Generic drug: umeclidinium bromide Inhale 1 puff into the lungs daily. For your breathing   losartan 100 MG tablet Commonly known as: COZAAR Take 1 tablet (100 mg total) by mouth daily. For blood pressure   metFORMIN 500 MG tablet Commonly known as: GLUCOPHAGE Take 1 tablet (500 mg total) by mouth 2 (two) times daily with a meal.   multivitamin with minerals Tabs tablet Take 1 tablet by mouth daily. For nutrition   nystatin powder Commonly known as: MYCOSTATIN/NYSTOP Apply 1 Application topically 3 (three) times daily.   nystatin ointment Commonly known as: MYCOSTATIN Apply 1 Application topically 2 (two) times daily.   Olopatadine HCl 0.2 % Soln Apply 1 drop to eye daily.   omeprazole 40 MG capsule Commonly known as: PRILOSEC Take 1 capsule (40 mg total) by mouth daily. For heartburn   polyethylene glycol powder 17 GM/SCOOP powder Commonly known as: GLYCOLAX/MIRALAX Take 17 g by mouth 2 (two) times daily as needed.   PRESCRIPTION MEDICATION Take 15 g by mouth as needed (low blood sugar). Take 1 tube (15 grams) of glucose every 15 minutes as needed for low blood sugar   rosuvastatin 10 MG tablet Commonly known as: CRESTOR Take 1 tablet (10 mg total) by mouth at bedtime. For  cholesterol   Rybelsus 14 MG Tabs Generic drug: Semaglutide Take 1 tablet (14 mg total) by mouth daily.   sodium hypochlorite external solution Commonly known as: DAKIN'S 1/2 STRENGTH Apply 1 Application topically daily at 12 noon. Topically to wound daily to clean.   Torsemide 40 MG Tabs Take by mouth.   Zinc Oxide 10 % Oint Apply topically.               Discharge Care Instructions  (From admission, onward)           Start     Ordered   06/22/22 0000  Discharge wound care:       Comments: As above   06/22/22 1059           Allergies  Allergen Reactions   Farxiga [Dapagliflozin] Rash    Causes a severe rash in groin area    Dulaglutide Diarrhea and Other (See Comments)   Liraglutide Diarrhea and Other (  See Comments)   Jardiance [Empagliflozin] Rash   Discharge Instructions     Call MD for:   Complete by: As directed    Dysuria or hematuria   Call MD for:  difficulty breathing, headache or visual disturbances   Complete by: As directed    Call MD for:  extreme fatigue   Complete by: As directed    Call MD for:  persistant dizziness or light-headedness   Complete by: As directed    Call MD for:  persistant nausea and vomiting   Complete by: As directed    Call MD for:  severe uncontrolled pain   Complete by: As directed    Call MD for:  temperature >100.4   Complete by: As directed    Diet - low sodium heart healthy   Complete by: As directed    Discharge instructions   Complete by: As directed    Follow-up with PCP in 1 week, repeat CBC and BMP after 1 week.  Need to follow-up on anticoagulation for history of pulmonary embolism.  Patient already took Eliquis for more than 6 months.  Need further evaluation whether he needs to continue long-term anticoagulation, may benefit from follow-up with hematologist. Follow-up with urology if persistent hematuria or dysuria.   Discharge wound care:   Complete by: As directed    As above   Increase  activity slowly   Complete by: As directed        The results of significant diagnostics from this hospitalization (including imaging, microbiology, ancillary and laboratory) are listed below for reference.    Significant Diagnostic Studies: US RENAL  Result Date: 06/17/2022 CLINICAL DATA:  Acute renal failure EXAM: RENAL / URINARY TRACT ULTRASOUND COMPLETE COMPARISON:  CT 06/13/2022, CT 09/30/2021 FINDINGS: Right Kidney: Renal measurements: 11.8 x 5.3 x 5.9 cm = volume: 190.6 mL. No hydronephrosis. Mildly increased renal cortical echogenicity. There is a measurement on a pseudomass in the right kidney which has no correlate on prior contrast enhanced CT, likely represents lobular renal contour. Left Kidney: Renal measurements: 11.8 x 5.9 x 5.1 cm = volume: 188.6 mL. No hydronephrosis. Mildly increased renal cortical echogenicity. Bladder: Well distended but unremarkable. Other: None. IMPRESSION: No hydronephrosis. Mildly increased renal cortical echogenicity, as can be seen in medical renal disease. Electronically Signed   By: Maurine Simmering M.D.   On: 06/17/2022 10:42   CT Renal Stone Study  Result Date: 06/13/2022 CLINICAL DATA:  Abdominal and flank pain suspected kidney stone, hematuria, has catheter EXAM: CT ABDOMEN AND PELVIS WITHOUT CONTRAST TECHNIQUE: Multidetector CT imaging of the abdomen and pelvis was performed following the standard protocol without IV contrast. RADIATION DOSE REDUCTION: This exam was performed according to the departmental dose-optimization program which includes automated exposure control, adjustment of the mA and/or kV according to patient size and/or use of iterative reconstruction technique. COMPARISON:  01/30/2022 FINDINGS: Lower chest: Mild dependent atelectasis at lung bases. Hepatobiliary: Gallbladder surgically absent. Liver normal appearance. No biliary dilatation. Pancreas: Normal appearance Spleen: Normal appearance Adrenals/Urinary Tract: Adrenal glands normal  appearance. BILATERAL perinephric stranding increased from previous exam. No hydronephrosis or hydroureter. Bladder is decompressed, contains minimal urine and air. Mild surrounding perivesicular infiltrative changes. Foley catheter balloon inflated within prostate gland. Stomach/Bowel: Scattered stool throughout colon. Sigmoid anastomosis. Bowel loops otherwise unremarkable. Stomach normal appearance. Appendix not visualized. Vascular/Lymphatic: Atherosclerotic calcifications aorta and iliac arteries without aneurysm. Additional vascular calcifications at coronary arteries and visceral arteries. No adenopathy. Reproductive: Enlarged prostate gland unchanged. Other: No free air  or free fluid.  No hernia. Musculoskeletal: Osseous structures unremarkable. IMPRESSION: Foley catheter balloon inflated within prostate gland; recommend repositioning into bladder. Decompressed bladder with perivesicular stranding, as well as BILATERAL perinephric stranding, urinary tract infection/cystitis not excluded, recommend correlation with urinalysis. Enlarged prostate gland unchanged. No acute intra-abdominal or intrapelvic abnormalities. Aortic Atherosclerosis (ICD10-I70.0). Electronically Signed   By: Lavonia Dana M.D.   On: 06/13/2022 15:34   IR Radiologist Eval & Mgmt  Result Date: 06/01/2022 EXAM: ESTABLISHED PATIENT OFFICE VISIT CHIEF COMPLAINT: See Epic note. HISTORY OF PRESENT ILLNESS: See Epic note. REVIEW OF SYSTEMS: See Epic note. PHYSICAL EXAMINATION: See Epic note. ASSESSMENT AND PLAN: See Epic note. Ruthann Cancer, MD [facility] Electronically Signed   By: Ruthann Cancer M.D.   On: 06/01/2022 14:56    Microbiology: Recent Results (from the past 240 hour(s))  Urine Culture     Status: None   Collection Time: 06/13/22  2:54 PM   Specimen: Urine, Random  Result Value Ref Range Status   Specimen Description   Final    URINE, RANDOM Performed at [facility], [address], [place], [place] [address]    Special Requests   Final    NONE Performed at [facility], [address] [place], [place] [address]    Culture   Final    NO GROWTH Performed at [facility], [place] [address], [place],  [address]    Report Status 06/14/2022 FINAL  Final     Labs: CBC: Recent Labs  Lab 06/18/22 0504 06/19/22 0517 06/20/22 0604 06/21/22 0546 06/22/22 0519  WBC 7.2 7.1 6.2 7.0 6.4  HGB 8.0* 7.9* 8.4* 8.3* 8.6*  HCT 26.0* 25.0* 26.9* 27.0* 27.8*  MCV 84.7 84.5 83.3 84.6 84.2  PLT 258 265 274 298 [id number]   Basic Metabolic Panel: Recent Labs  Lab 06/16/22 0624 06/16/22 1106 06/18/22 0504 06/19/22 0517 06/20/22 0604 06/21/22 0546 06/22/22 0519  NA 126*   < > 132* 132* 133* 135 132*  K 3.9   < > 4.3 4.5 4.4 4.4 4.5  CL 93*   < > 95* 99 98 98 96*  CO2 25   < > 28 27 27 29 29   GLUCOSE 249*   < > 302* 272* 295* 198* 277*  BUN 45*   < > 66* 56* 53* 52* 53*  CREATININE 2.08*   < > 2.05* 1.70* 1.52* 1.50* 1.52*  CALCIUM 8.8*   < > 8.8* 9.1 9.4 9.6 9.3  MG 1.9  --  2.3  --  2.3 2.1 2.1  PHOS  --   --   --   --   --  3.7 3.8   < > = values in this interval not displayed.   Liver Function Tests: No results for input(s): "AST", "ALT", "ALKPHOS", "BILITOT", "PROT", "ALBUMIN" in the last 168 hours. No results for input(s): "LIPASE", "AMYLASE" in the last 168 hours. No results for input(s): "AMMONIA" in the last 168 hours. Cardiac Enzymes: No results for input(s): "CKTOTAL", "CKMB", "CKMBINDEX", "TROPONINI" in the last 168 hours. BNP (last 3 results) Recent Labs    10/31/21 1915 12/05/21 0616 06/13/22 1640  BNP 103.4* 108.4* 67.1   CBG: Recent Labs  Lab 06/21/22 0806 06/21/22 1209 06/21/22 1613 06/21/22 2141 06/22/22 0923  GLUCAP 206* 243* 190* 206* 233*    Time spent: 35 minutes  Signed:  Val Riles  [facility]  06/22/2022 11:39 AM

## 2022-06-23 ENCOUNTER — Telehealth: Payer: Self-pay | Admitting: *Deleted

## 2022-06-23 NOTE — Transitions of Care (Post Inpatient/ED Visit) (Signed)
   06/23/2022  Name: Justin Griffith MRN: [id number] DOB: 1941-01-14  Today's TOC FU Call Status: Today's TOC FU Call Status:: Successful TOC FU Call Competed TOC FU Call Complete Date: 06/23/22  Transition Care Management Follow-up Telephone Call Date of Discharge: 06/22/22 Discharge Facility: [facility]) Type of Discharge: Inpatient Admission Primary Inpatient Discharge Diagnosis:: Urinary retention How have you been since you were released from the hospital?: Better Any questions or concerns?: Yes Patient Questions/Concerns:: Patient stated that he has some fever blisters that came up on his lips. Patient will address with PCP at [phone] appt Patient Questions/Concerns Addressed: Notified Provider of Patient Questions/Concerns (Pt will notify Dr of concerns)  Items Reviewed: Medications obtained and verified?: Yes (Medications Reviewed) Any new allergies since your discharge?: No Dietary orders reviewed?: No Do you have support at home?: Yes People in Home: spouse Name of Support/Comfort Primary Source: [place] and friend [facility] and Equipment/Supplies: [place] Ordered?: Yes Name of [place]:: [facility] (PT/OT/RN/Aide) Has Agency set up a time to come to your home?: Yes [place] Visit Date: 06/24/22 Any new equipment or medical supplies ordered?: No  Functional Questionnaire: Do you need assistance with bathing/showering or dressing?: No Do you need assistance with meal preparation?: Yes Do you need assistance with eating?: No Do you have difficulty maintaining continence: No Do you need assistance with getting out of bed/getting out of a chair/moving?: No Do you have difficulty managing or taking your medications?: No  Follow up appointments reviewed: PCP Follow-up appointment confirmed?: Yes Date of PCP follow-up appointment?: 06/25/22 Follow-up Provider: [facility] 1:00 [facility]  Follow-up appointment confirmed?: Yes Date of Specialist follow-up appointment?: 07/15/22 Follow-Up Specialty Provider:: Urology 10:00 Do you need transportation to your follow-up appointment?: No Do you understand care options if your condition(s) worsen?: Yes-patient verbalized understanding  SDOH Interventions Today    Flowsheet Row Most Recent Value  SDOH Interventions   Food Insecurity Interventions Intervention Not Indicated  Housing Interventions Intervention Not Indicated  Transportation Interventions Intervention Not Indicated      Interventions Today    Flowsheet Row Most Recent Value  General Interventions   General Interventions Discussed/Reviewed General Interventions Discussed, General Interventions Reviewed, Doctor Visits  Doctor Visits Discussed/Reviewed Doctor Visits Reviewed, Doctor Visits Discussed, Specialist  PCP/Specialist Visits Compliance with follow-up visit       Patient is being followed by chronic care management [facility] [phone]

## 2022-06-24 ENCOUNTER — Telehealth: Payer: Self-pay | Admitting: Family Medicine

## 2022-06-24 NOTE — Telephone Encounter (Signed)
[name] with [facility] is calling because they need an order to postpone pt's start of care to Friday. Pt is requesting care start on Friday.

## 2022-06-25 ENCOUNTER — Ambulatory Visit (INDEPENDENT_AMBULATORY_CARE_PROVIDER_SITE_OTHER): Payer: PPO | Admitting: Family Medicine

## 2022-06-25 ENCOUNTER — Ambulatory Visit (INDEPENDENT_AMBULATORY_CARE_PROVIDER_SITE_OTHER): Payer: PPO | Admitting: Physician Assistant

## 2022-06-25 VITALS — BP 206/109 | HR 96 | Temp 98.1°F

## 2022-06-25 DIAGNOSIS — Z794 Long term (current) use of insulin: Secondary | ICD-10-CM | POA: Diagnosis not present

## 2022-06-25 DIAGNOSIS — I130 Hypertensive heart and chronic kidney disease with heart failure and stage 1 through stage 4 chronic kidney disease, or unspecified chronic kidney disease: Secondary | ICD-10-CM | POA: Diagnosis not present

## 2022-06-25 DIAGNOSIS — Z86711 Personal history of pulmonary embolism: Secondary | ICD-10-CM

## 2022-06-25 DIAGNOSIS — N401 Enlarged prostate with lower urinary tract symptoms: Secondary | ICD-10-CM

## 2022-06-25 DIAGNOSIS — D519 Vitamin B12 deficiency anemia, unspecified: Secondary | ICD-10-CM

## 2022-06-25 DIAGNOSIS — E1165 Type 2 diabetes mellitus with hyperglycemia: Secondary | ICD-10-CM

## 2022-06-25 DIAGNOSIS — N1832 Chronic kidney disease, stage 3b: Secondary | ICD-10-CM

## 2022-06-25 DIAGNOSIS — N179 Acute kidney failure, unspecified: Secondary | ICD-10-CM | POA: Diagnosis not present

## 2022-06-25 DIAGNOSIS — R339 Retention of urine, unspecified: Secondary | ICD-10-CM | POA: Diagnosis not present

## 2022-06-25 DIAGNOSIS — E538 Deficiency of other specified B group vitamins: Secondary | ICD-10-CM

## 2022-06-25 DIAGNOSIS — R350 Frequency of micturition: Secondary | ICD-10-CM

## 2022-06-25 DIAGNOSIS — I5032 Chronic diastolic (congestive) heart failure: Secondary | ICD-10-CM | POA: Diagnosis not present

## 2022-06-25 DIAGNOSIS — N138 Other obstructive and reflux uropathy: Secondary | ICD-10-CM

## 2022-06-25 DIAGNOSIS — E871 Hypo-osmolality and hyponatremia: Secondary | ICD-10-CM | POA: Diagnosis not present

## 2022-06-25 DIAGNOSIS — R310 Gross hematuria: Secondary | ICD-10-CM | POA: Diagnosis not present

## 2022-06-25 DIAGNOSIS — I129 Hypertensive chronic kidney disease with stage 1 through stage 4 chronic kidney disease, or unspecified chronic kidney disease: Secondary | ICD-10-CM

## 2022-06-25 DIAGNOSIS — N3001 Acute cystitis with hematuria: Secondary | ICD-10-CM

## 2022-06-25 DIAGNOSIS — E1122 Type 2 diabetes mellitus with diabetic chronic kidney disease: Secondary | ICD-10-CM

## 2022-06-25 LAB — URINALYSIS, COMPLETE
Bilirubin, UA: NEGATIVE
Ketones, UA: NEGATIVE
Nitrite, UA: NEGATIVE
Specific Gravity, UA: 1.02 (ref 1.005–1.030)
Urobilinogen, Ur: 0.2 mg/dL (ref 0.2–1.0)
pH, UA: 7 (ref 5.0–7.5)

## 2022-06-25 LAB — BLADDER SCAN AMB NON-IMAGING: Scan Result: 16

## 2022-06-25 LAB — MICROSCOPIC EXAMINATION

## 2022-06-25 MED ORDER — CYANOCOBALAMIN 1000 MCG/ML IJ SOLN
1000.0000 ug | INTRAMUSCULAR | Status: AC
Start: 2022-07-25 — End: 2023-07-19
  Administered 2022-08-03: 1000 ug via INTRAMUSCULAR

## 2022-06-25 MED ORDER — DOXYCYCLINE HYCLATE 100 MG PO CAPS
100.0000 mg | ORAL_CAPSULE | Freq: Two times a day (BID) | ORAL | 0 refills | Status: AC
Start: 2022-06-25 — End: 2022-07-23

## 2022-06-25 MED ORDER — CYANOCOBALAMIN 1000 MCG/ML IJ SOLN
1000.0000 ug | INTRAMUSCULAR | Status: AC
Start: 2022-06-25 — End: 2022-07-23
  Administered 2022-06-25 – 2022-07-02 (×2): 1000 ug via INTRAMUSCULAR

## 2022-06-25 NOTE — Patient Instructions (Signed)
You are emptying your bladder well today, no need to put a catheter back in. I'm starting you on a new antibiotic, doxycycline, for a possible prostate infection that would be contributing to your urinary frequency. I'm prescribing it to be taken twice daily for 4 weeks. Please keep your scheduled follow-up appointment with Dr. Diamantina Providence.

## 2022-06-25 NOTE — Assessment & Plan Note (Signed)
Eliquis has been on hold since he went to the hospital. Will continue to hold it for now- will get him back into Dr. Tasia Catchings for ? Length of need for anti-coagulant. Previously they recommended continuing him on 2.5mg  eliquis, but with recent clots and gross hematuria will see if they recommend continuing it.

## 2022-06-25 NOTE — Progress Notes (Signed)
BP (!) 206/109   Pulse 96   Temp 98.1 F (36.7 C)   SpO2 97%    Subjective:    Patient ID: Justin Griffith, male    DOB: [birth year], 82 y.o.   MRN: [id number]  HPI: Justin Griffith is a 82 y.o. male  Chief Complaint  Patient presents with   Hospitalization Follow-up   Transition of Care Hospital Follow up.   Hospital/Facility: [facility] D/C Physician: Dr. 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   Gross hematuria   UTI (urinary tract infection)   Acute renal failure superimposed on stage 3b chronic kidney disease   CAD (coronary artery disease)   Chronic obstructive pulmonary disease (COPD)   Chronic diastolic CHF (congestive heart failure)   Pulmonary embolism   History of pulmonary embolism 09/18/21   HTN (hypertension)   HLD (hyperlipidemia)   Stage 3b chronic kidney disease (CKD)   Uncontrolled type 2 diabetes mellitus with hyperglycemia, with long-term current use of insulin   History of CVA (cerebrovascular accident)   Sleep apnea   Obesity (BMI 30-39.9)   Senile dementia with delirium   Pressure injury of skin   B12 deficiency anemia  Date of interactive Contact within 48 hours of discharge: 06/23/22 Contact was through: phone  Date of 7 day or 14 day face-to-face visit:  06/25/22  within 7 days  Outpatient Encounter Medications as of 06/25/2022  Medication Sig   acyclovir (ZOVIRAX) 400 MG tablet Take 1 tablet (400 mg total) by mouth 2 (two) times daily.   blood glucose meter kit and supplies 1 each by Other route daily. Dispense based on patient and insurance preference. Use up to four times daily as directed. (FOR ICD-10 E10.9, E11.9).   cloNIDine (CATAPRES) 0.1 MG tablet Take 1 tablet (0.1 mg total) by mouth 2 (two) times daily. For blood pressure   diclofenac Sodium (VOLTAREN) 1 % GEL Apply  4 g topically 4 (four) times daily.   doxycycline (VIBRAMYCIN) 100 MG capsule Take 1 capsule (100 mg total) by mouth 2 (two) times daily for 28 days.   gabapentin (NEURONTIN) 400 MG capsule Take 1 capsule (400 mg total) by mouth 3 (three) times daily. For nerve pain and itching (Patient taking differently: Take 100 mg by mouth 3 (three) times daily. For nerve pain and itching)   GNP VITAMIN C 500 MG tablet TAKE 1 TABLET BY MOUTH ONCE DAILY TO KEEP YOUR IMMUNE SYSTEM UP   hydrALAZINE (APRESOLINE) 100 MG tablet Take 1 tablet (100 mg total) by mouth 3 (three) times daily. for blood pressure   losartan (COZAAR) 100 MG tablet Take 1 tablet (100 mg total) by mouth daily. For blood pressure   metFORMIN (GLUCOPHAGE) 500 MG tablet Take 1 tablet (500 mg total) by mouth 2 (two) times daily with a meal.   Multiple Vitamin (MULTIVITAMIN WITH MINERALS) TABS tablet Take 1 tablet by mouth daily. For nutrition   nystatin (MYCOSTATIN/NYSTOP) powder Apply 1 Application topically 3 (three) times daily.   nystatin ointment (MYCOSTATIN) Apply 1 Application topically 2 (two) times daily.   Olopatadine HCl 0.2 % SOLN Apply 1 drop to eye daily.   omeprazole (PRILOSEC) 40 MG capsule Take 1 capsule (40 mg total) by mouth daily. For heartburn   polyethylene glycol powder (GLYCOLAX/MIRALAX) 17 GM/SCOOP powder Take 17 g by mouth 2 (two) times daily  as needed.   rosuvastatin (CRESTOR) 10 MG tablet Take 1 tablet (10 mg total) by mouth at bedtime. For cholesterol   Semaglutide (RYBELSUS) 14 MG TABS Take 1 tablet (14 mg total) by mouth daily.   sodium hypochlorite (DAKIN'S 1/2 STRENGTH) external solution Apply 1 Application topically daily at 12 noon. Topically to wound daily to clean.   Torsemide 40 MG TABS Take by mouth.   umeclidinium bromide (INCRUSE ELLIPTA) 62.5 MCG/ACT AEPB Inhale 1 puff into the lungs daily. For your breathing   Zinc Oxide 10 % OINT Apply topically.   [DISCONTINUED] apixaban (ELIQUIS) 2.5 MG TABS tablet  Take 1 tablet (2.5 mg total) by mouth 2 (two) times daily. F/u PCP for blood thinner, may resume if needs for long term?   [DISCONTINUED] cyanocobalamin 1000 MCG tablet Take 1 tablet (1,000 mcg total) by mouth daily.   [DISCONTINUED] PRESCRIPTION MEDICATION Take 15 g by mouth as needed (low blood sugar). Take 1 tube (15 grams) of glucose every 15 minutes as needed for low blood sugar   Facility-Administered Encounter Medications as of 06/25/2022  Medication   cyanocobalamin (VITAMIN B12) injection 1,000 mcg   [START ON 07/25/2022] cyanocobalamin (VITAMIN B12) injection 1,000 mcg  Per Hospitalist: "Hospital Course:  Justin Griffith is a 82 y.o. male with medical history significant of hypertension, hyperlipidemia, diabetes mellitus, COPD/asthma, stroke, depression, OSA, colon cancer, BPH with urinary retention with chronic indwelling catheter placement, PE on Eliquis, obesity, CAD, CABG, CKD-3B, dCHF, PAD, chronic indwelling Foley cath, who presents to ED due to Foley cath not draining.  He has some gross hematuria and Foley catheter was advanced.  Patient has been seen by urology, apparently patient had recurrent obstruction with Foley catheter with blood clots.  Anticoagulation was discontinued.  Patient is also seen by vascular surgery.  Not planning for IVC filter.  Patient has completed 6 months of anticoagulation for his a first episode of PE. Anticoagulation is on hold.  Hematuria is improving.  Patient was also started on treated with Rocephin, urine culture came back without growth, urology still want antibiotics due to obstruction.  Assessment and Plan: # Urinary retention due to gross hematuria and BPH with   obstruction/lower urinary tract symptoms Foley dependent Possible urinary tract infection with hematuria. Patient condition was better, patient did well with voiding trial, Foley catheter was removed.  However, patient has severe penile pain afterwards.  Patient treated with pain medicine,  pyridium.   Pyridium discontinued due to worsening renal function. Penile pain is better, s/p oral antibiotics.  Completed course, no need of more antibiotics.  Dysuria and hematuria resolved.  Follow with urology in 1 to 2 weeks. # Vitamin B12 deficient anemia: Previously, patient had lowl B12 level, current B12 level borderline.  Restarted B12 supplement. Hemoglobin is low but stable. # Hyponatremia, monitor, repeat BMP after 1 week # Acute kidney injury on chronic kidney disease stage IIIb. Cr 1.7--1.52 Renal function improving s/p IVF renal ultrasound did not show any obstruction, showed medical kidney disease.  Na 126--135--132 improved but fluctuating, s/p salt tablets for hyponatremia.  Repeat BMP after 1 week # History of pulmonary embolism 09/18/21: Anticoagulation on hold due to active bleeding.  Per vascular surgery, patient was supposed to be taking Eliquis for 6 months, which has completed.  However, patient does have moderate to high risk for recurrent thrombosis.  Discussed with patient wife, will continue hold anticoagulation. Since Foley catheter is removed, may consider restart anticoagulation in a week or 2.  Patient  was advised to follow with PCP and hematologist as an outpatient to restart anticoagulation if needed. # Dementia with delirium. Condition stable. # Essential hypertension, Continue home medicine. # CAD (coronary artery disease): Continue Crestor # Chronic obstructive pulmonary disease (COPD), Stable. # Type II diabetes mellitus with renal manifestations  # Uncontrolled type 2 diabetes with hyperglycemia: Hemoglobin A1c 11.5 on 06/02/2022. Insulin dose increased during hospital stay.  Patient was advised to follow with PCP, resumed home regimen, continue diabetic diet and monitor CBG. # HLD (hyperlipidemia), continue Crestor  # Chronic diastolic CHF (congestive heart failure) (HCC): 2D echo on 09/20/2021 showed EF of 60 to 65%.  Patient euvolemic, no exacerbation. Torsemide  is restarted, patient renal function has improved. # History of CVA (cerebrovascular accident), on Crestor # Obesity, Body mass index is 35.56 kg/m.  Nutrition Interventions: Diet control and exercise."    Diagnostic Tests Reviewed:  CLINICAL DATA:  Abdominal and flank pain suspected kidney stone, hematuria, has catheter   EXAM: CT ABDOMEN AND PELVIS WITHOUT CONTRAST   TECHNIQUE: Multidetector CT imaging of the abdomen and pelvis was performed following the standard protocol without IV contrast.   RADIATION DOSE REDUCTION: This exam was performed according to the departmental dose-optimization program which includes automated exposure control, adjustment of the mA and/or kV according to patient size and/or use of iterative reconstruction technique.   COMPARISON:  01/30/2022   FINDINGS: Lower chest: Mild dependent atelectasis at lung bases.   Hepatobiliary: Gallbladder surgically absent. Liver normal appearance. No biliary dilatation.   Pancreas: Normal appearance   Spleen: Normal appearance   Adrenals/Urinary Tract: Adrenal glands normal appearance. BILATERAL perinephric stranding increased from previous exam. No hydronephrosis or hydroureter. Bladder is decompressed, contains minimal urine and air. Mild surrounding perivesicular infiltrative changes. Foley catheter balloon inflated within prostate gland.   Stomach/Bowel: Scattered stool throughout colon. Sigmoid anastomosis. Bowel loops otherwise unremarkable. Stomach normal appearance. Appendix not visualized.   Vascular/Lymphatic: Atherosclerotic calcifications aorta and iliac arteries without aneurysm. Additional vascular calcifications at coronary arteries and visceral arteries. No adenopathy.   Reproductive: Enlarged prostate gland unchanged.   Other: No free air or free fluid.  No hernia.   Musculoskeletal: Osseous structures unremarkable.   IMPRESSION: Foley catheter balloon inflated within prostate  gland; recommend repositioning into bladder.   Decompressed bladder with perivesicular stranding, as well as BILATERAL perinephric stranding, urinary tract infection/cystitis not excluded, recommend correlation with urinalysis.   Enlarged prostate gland unchanged.   No acute intra-abdominal or intrapelvic abnormalities.   Aortic Atherosclerosis (ICD10-I70.0).  CLINICAL DATA:  Acute renal failure   EXAM: RENAL / URINARY TRACT ULTRASOUND COMPLETE   COMPARISON:  CT 06/13/2022, CT 09/30/2021   FINDINGS: Right Kidney:   Renal measurements: 11.8 x 5.3 x 5.9 cm = volume: 190.6 mL. No hydronephrosis. Mildly increased renal cortical echogenicity. There is a measurement on a pseudomass in the right kidney which has no correlate on prior contrast enhanced CT, likely represents lobular renal contour.   Left Kidney:   Renal measurements: 11.8 x 5.9 x 5.1 cm = volume: 188.6 mL. No hydronephrosis. Mildly increased renal cortical echogenicity.   Bladder:   Well distended but unremarkable.   Other:   None.   IMPRESSION: No hydronephrosis. Mildly increased renal cortical echogenicity, as can be seen in medical renal disease.  Disposition: Home, refused SNF  Consults: Urology  Discharge Instructions:  Follow-up with PCP in 1 week, repeat CBC and BMP after 1 week.  Need to follow-up on anticoagulation for history of pulmonary embolism.  Patient already took Eliquis for more than 6 months.  Need further evaluation whether he needs to continue long-term anticoagulation, may benefit from follow-up with hematologist. Follow-up with urology, pt already has an appt Follow up LABS/TEST:  CBC and BMP in 1 wk  Disease/illness Education: Discussed today  Home Health/Community Services Discussions/Referrals: In place  Establishment or re-establishment of referral orders for community resources: In place  Discussion with other health care providers: None  Assessment and Support of  treatment regimen adherence: Fair  Appointments Coordinated with: Patient  Education for self-management, independent living, and ADLs: Discussed today  Justin Griffith notes that he has not been feeling well since he got out of the hospital. He has been peeing every 15 minutes and that has made it so he can't sleep. He has been very tired and that is making him not feel well. He went to see urology this morning and they got him started on doxycycline for a ?prostatitis. He has not picked up his new pill pack yet. His fatigue is the worst symptom he is noticing.   Relevant past medical, surgical, family and social history reviewed and updated as indicated. Interim medical history since our last visit reviewed. Allergies and medications reviewed and updated.  Review of Systems  Constitutional:  Positive for fatigue. Negative for activity change, appetite change, chills, diaphoresis, fever and unexpected weight change.  HENT: Negative.    Respiratory: Negative.    Cardiovascular: Negative.   Gastrointestinal: Negative.   Genitourinary: Negative.   Musculoskeletal: Negative.   Psychiatric/Behavioral: Negative.      Per HPI unless specifically indicated above     Objective:    BP (!) 206/109   Pulse 96   Temp 98.1 F (36.7 C)   SpO2 97%   Wt Readings from Last 3 Encounters:  06/20/22 247 lb 12.8 oz (112.4 kg)  06/10/22 249 lb 4.8 oz (113.1 kg)  06/09/22 244 lb (110.7 kg)    Physical Exam Vitals and nursing note reviewed.  Constitutional:      General: He is not in acute distress.    Appearance: Normal appearance. He is obese. He is ill-appearing. He is not toxic-appearing or diaphoretic.  HENT:     Head: Normocephalic and atraumatic.     Right Ear: External ear normal.     Left Ear: External ear normal.     Nose: Nose normal.     Mouth/Throat:     Mouth: Mucous membranes are moist.     Pharynx: Oropharynx is clear.  Eyes:     General: No scleral icterus.       Right eye: No  discharge.        Left eye: No discharge.     Extraocular Movements: Extraocular movements intact.     Conjunctiva/sclera: Conjunctivae normal.     Pupils: Pupils are equal, round, and reactive to light.  Cardiovascular:     Rate and Rhythm: Normal rate and regular rhythm.     Pulses: Normal pulses.     Heart sounds: Normal heart sounds. No murmur heard.    No friction rub. No gallop.  Pulmonary:     Effort: Pulmonary effort is normal. No respiratory distress.     Breath sounds: Normal breath sounds. No stridor. No wheezing, rhonchi or rales.  Chest:     Chest wall: No tenderness.  Musculoskeletal:        General: Normal range of motion.     Cervical back: Normal range of motion and neck supple.  Skin:    General: Skin is warm and dry.     Capillary Refill: Capillary refill takes less than 2 seconds.     Coloration: Skin is not jaundiced or pale.     Findings: No bruising, erythema, lesion or rash.  Neurological:     General: No focal deficit present.     Mental Status: He is alert and oriented to person, place, and time. Mental status is at baseline.  Psychiatric:        Mood and Affect: Mood normal.        Behavior: Behavior normal.        Thought Content: Thought content normal.        Judgment: Judgment normal.     Results for orders placed or performed in visit on 06/25/22  Basic metabolic panel  Result Value Ref Range   Glucose 215 (H) 70 - 99 mg/dL   BUN 25 8 - 27 mg/dL   Creatinine, Ser 3.95 0.76 - 1.27 mg/dL   eGFR 59 (L) >32 YE/BXI/3.56   BUN/Creatinine Ratio 20 10 - 24   Sodium 135 134 - 144 mmol/L   Potassium 4.3 3.5 - 5.2 mmol/L   Chloride 98 96 - 106 mmol/L   CO2 22 20 - 29 mmol/L   Calcium 10.5 (H) 8.6 - 10.2 mg/dL  CBC with Differential/Platelet  Result Value Ref Range   WBC 8.5 3.4 - 10.8 x10E3/uL   RBC 3.63 (L) 4.14 - 5.80 x10E6/uL   Hemoglobin 9.4 (L) 13.0 - 17.7 g/dL   Hematocrit 86.1 (L) 68.3 - 51.0 %   MCV 82 79 - 97 fL   MCH 25.9 (L) 26.6 -  33.0 pg   MCHC 31.4 (L) 31.5 - 35.7 g/dL   RDW 72.9 02.1 - 11.5 %   Platelets 322 150 - 450 x10E3/uL   Neutrophils 65 Not Estab. %   Lymphs 22 Not Estab. %   Monocytes 9 Not Estab. %   Eos 2 Not Estab. %   Basos 1 Not Estab. %   Neutrophils Absolute 5.6 1.4 - 7.0 x10E3/uL   Lymphocytes Absolute 1.9 0.7 - 3.1 x10E3/uL   Monocytes Absolute 0.7 0.1 - 0.9 x10E3/uL   EOS (ABSOLUTE) 0.2 0.0 - 0.4 x10E3/uL   Basophils Absolute 0.1 0.0 - 0.2 x10E3/uL   Immature Granulocytes 1 Not Estab. %   Immature Grans (Abs) 0.0 0.0 - 0.1 x10E3/uL      Assessment & Plan:   Problem List Items Addressed This Visit       Genitourinary   Gross hematuria    Eliquis has been on hold since he went to the hospital. Will continue to hold it for now- will get him back into Dr. Cathie Hoops for ? Length of need for anti-coagulant. Previously they recommended continuing him on 2.5mg  eliquis, but with recent clots and gross hematuria will see if they recommend continuing it.       Benign hypertensive renal disease    Not under good control. Has not picked up his pill pack. Will restart his medicine and recheck next week.        UTI (urinary tract infection)    Following with urology. S/P foley removal. Continue to follow with them. Take doxycycline as prescribed.       Urinary retention - Primary    Following with urology. S/P foley removal. Continue to follow with them. Take doxycycline as prescribed.         Other   History of pulmonary  embolism 09/18/21 (Chronic)    Given clots in his urine, ?if he needs to continue his eliquis. Will get him back into see hematology for ?length of medication.       B12 deficiency    Will start him on IM B12 to decrease pill burden. Call with any concerns.       Hyponatremia    Rechecking labs today. Await results. Treat as needed.       B12 deficiency anemia    Will start him on IM B12 to decrease pill burden. Call with any concerns.       Relevant Medications    cyanocobalamin (VITAMIN B12) injection 1,000 mcg   cyanocobalamin (VITAMIN B12) injection 1,000 mcg (Start on 07/25/2022 12:00 AM)   Other Relevant Orders   CBC with Differential/Platelet (Completed)   Other Visit Diagnoses     AKI (acute kidney injury)       Rechecking labs today. Await result.   Relevant Orders   Basic metabolic panel (Completed)        Follow up plan: Return in about 1 week (around 07/02/2022).

## 2022-06-25 NOTE — Telephone Encounter (Signed)
Noted  

## 2022-06-25 NOTE — Assessment & Plan Note (Signed)
Not under good control. Has not picked up his pill pack. Will restart his medicine and recheck next week.

## 2022-06-25 NOTE — Progress Notes (Signed)
06/25/2022 8:56 AM   Justin Griffith 04-14-1940 [id number]  CC: Chief Complaint  Patient presents with   Urinary Retention   HPI: Justin Griffith is a 82 y.o. comorbid male with a recent history of BPH with urinary retention managed with indwelling Foley s/p PAE in February with numerous ED visits and hospitalizations with gross hematuria and catheter malfunction who ultimately passed an inpatient voiding trial last week who presents today for evaluation of possible urinary retention.   He reported severe penile pain and dysuria immediately following Foley catheter removal during his recent hospitalization.  Inpatient urine culture finalized with no growth, however we treated him with empiric Rocephin and Keflex for total of 7 days to sterilize the urine in the setting of prolonged catheterization. He was not discharged on antibiotics due to having completed the course while admitted.  Today he reports urinary frequency every 15 minutes and the sensation of incomplete bladder emptying.  His dysuria is now intermittent.  In-office UA today positive for trace glucose, trace lysed blood, and trace leukocytes; urine microscopy with 11-30 WBCs/HPF. PVR 45mL.  PMH: Past Medical History:  Diagnosis Date   Acute urinary retention    Arthritis    Asthma 2000   Back pain    Colon polyp 2012   COPD (chronic obstructive pulmonary disease) (HCC)    Emphysema of lung (HCC)    Heart murmur    Hernia 2000   History of cold sores 05/06/2020   History of colon cancer    Hyperlipidemia    Hypertension    Personal history of malignant neoplasm of large intestine    Personal history of tobacco use, presenting hazards to health    Septic shock ([place]) 11/18/2021   Sleep apnea    Special screening for malignant neoplasms, colon    Type 2 diabetes mellitus with proteinuria ([place]) 10/11/2014    Surgical History: Past Surgical History:  Procedure Laterality Date   CARDIAC CATHETERIZATION Left  09/25/2015   Procedure: Left Heart Cath and Coronary Angiography;  Surgeon: Yolonda Kida, MD;  Location: [facility];  Service: Cardiovascular;  Laterality: Left;   CHOLECYSTECTOMY  1970   COLON SURGERY  07-16-99   sigmoid colon resection with primary anastomosis, chemotherapy for metastatic disease   COLONOSCOPY  2001, 2012   Dr Bary Castilla, tubular adenoma of the cecum and ascending colon in 2012.   COLONOSCOPY WITH PROPOFOL N/A 02/12/2016   Procedure: COLONOSCOPY WITH PROPOFOL;  Surgeon: Robert Bellow, MD;  Location: [facility];  Service: Endoscopy;  Laterality: N/A;   CORONARY STENT INTERVENTION N/A 05/17/2020   Procedure: CORONARY STENT INTERVENTION;  Surgeon: Yolonda Kida, MD;  Location: [place] [facility];  Service: Cardiovascular;  Laterality: N/A;  LAD   ESOPHAGOGASTRODUODENOSCOPY (EGD) WITH PROPOFOL N/A 02/12/2016   Procedure: ESOPHAGOGASTRODUODENOSCOPY (EGD) WITH PROPOFOL;  Surgeon: Robert Bellow, MD;  Location: [facility];  Service: Endoscopy;  Laterality: N/A;   HERNIA REPAIR Right    right inguinal hernia repair   IR 3D INDEPENDENT WKST  05/07/2022   IR ANGIOGRAM PELVIS SELECTIVE OR SUPRASELECTIVE  05/07/2022   IR ANGIOGRAM PELVIS SELECTIVE OR SUPRASELECTIVE  05/07/2022   IR ANGIOGRAM SELECTIVE EACH ADDITIONAL VESSEL  05/07/2022   IR EMBO ARTERIAL NOT HEMORR HEMANG INC GUIDE ROADMAPPING  05/07/2022   IR RADIOLOGIST EVAL & MGMT  02/27/2022   IR RADIOLOGIST EVAL & MGMT  03/25/2022   IR RADIOLOGIST EVAL & MGMT  06/01/2022   IR US GUIDE VASC ACCESS  LEFT  05/07/2022   JOINT REPLACEMENT Bilateral    knee replacement   KNEE SURGERY Bilateral 2010   LEFT HEART CATH AND CORONARY ANGIOGRAPHY N/A 05/17/2020   Procedure: LEFT HEART CATH AND CORONARY ANGIOGRAPHY possible PCI and stent;  Surgeon: Yolonda Kida, MD;  Location: [facility];  Service: Cardiovascular;  Laterality: N/A;   MYRINGOTOMY WITH TUBE PLACEMENT Bilateral 08/16/2014   Procedure:  MYRINGOTOMY WITH TUBE PLACEMENT;  Surgeon: Carloyn Manner, MD;  Location: [facility];  Service: ENT;  Laterality: Bilateral;   PERIPHERAL VASCULAR CATHETERIZATION Right 09/04/2015   Procedure: Lower Extremity Angiography;  Surgeon: Katha Cabal, MD;  Location: [place] [facility];  Service: Cardiovascular;  Laterality: Right;   PERIPHERAL VASCULAR CATHETERIZATION  09/04/2015   Procedure: Lower Extremity Intervention;  Surgeon: Katha Cabal, MD;  Location: [place] [facility];  Service: Cardiovascular;;   TONSILLECTOMY     TYMPANOSTOMY TUBE PLACEMENT      Home Medications:  Allergies as of 06/25/2022       Reactions   Farxiga [dapagliflozin] Rash   Causes a severe rash in groin area   Dulaglutide Diarrhea, Other (See Comments)   Liraglutide Diarrhea, Other (See Comments)   Jardiance [empagliflozin] Rash        Medication List        Accurate as of June 25, 2022  8:56 AM. If you have any questions, ask your nurse or doctor.          acyclovir 400 MG tablet Commonly known as: ZOVIRAX Take 1 tablet (400 mg total) by mouth 2 (two) times daily.   apixaban 2.5 MG Tabs tablet Commonly known as: ELIQUIS Take 1 tablet (2.5 mg total) by mouth 2 (two) times daily. F/u PCP for blood thinner, may resume if needs for long term?   blood glucose meter kit and supplies 1 each by Other route daily. Dispense based on patient and insurance preference. Use up to four times daily as directed. (FOR ICD-10 E10.9, E11.9).   cloNIDine 0.1 MG tablet Commonly known as: CATAPRES Take 1 tablet (0.1 mg total) by mouth 2 (two) times daily. For blood pressure   cyanocobalamin 1000 MCG tablet Take 1 tablet (1,000 mcg total) by mouth daily.   diclofenac Sodium 1 % Gel Commonly known as: Voltaren Apply 4 g topically 4 (four) times daily.   gabapentin 400 MG capsule Commonly known as: NEURONTIN Take 1 capsule (400 mg total) by mouth 3 (three) times daily. For nerve pain and itching What  changed: how much to take   GNP Vitamin C 500 MG tablet Generic drug: ascorbic acid TAKE 1 TABLET BY MOUTH ONCE DAILY TO KEEP YOUR IMMUNE SYSTEM UP   hydrALAZINE 100 MG tablet Commonly known as: APRESOLINE Take 1 tablet (100 mg total) by mouth 3 (three) times daily. for blood pressure   Incruse Ellipta 62.5 MCG/ACT Aepb Generic drug: umeclidinium bromide Inhale 1 puff into the lungs daily. For your breathing   losartan 100 MG tablet Commonly known as: COZAAR Take 1 tablet (100 mg total) by mouth daily. For blood pressure   metFORMIN 500 MG tablet Commonly known as: GLUCOPHAGE Take 1 tablet (500 mg total) by mouth 2 (two) times daily with a meal.   multivitamin with minerals Tabs tablet Take 1 tablet by mouth daily. For nutrition   nystatin powder Commonly known as: MYCOSTATIN/NYSTOP Apply 1 Application topically 3 (three) times daily.   nystatin ointment Commonly known as: MYCOSTATIN Apply 1 Application topically 2 (two)  times daily.   Olopatadine HCl 0.2 % Soln Apply 1 drop to eye daily.   omeprazole 40 MG capsule Commonly known as: PRILOSEC Take 1 capsule (40 mg total) by mouth daily. For heartburn   polyethylene glycol powder 17 GM/SCOOP powder Commonly known as: GLYCOLAX/MIRALAX Take 17 g by mouth 2 (two) times daily as needed.   PRESCRIPTION MEDICATION Take 15 g by mouth as needed (low blood sugar). Take 1 tube (15 grams) of glucose every 15 minutes as needed for low blood sugar   rosuvastatin 10 MG tablet Commonly known as: CRESTOR Take 1 tablet (10 mg total) by mouth at bedtime. For cholesterol   Rybelsus 14 MG Tabs Generic drug: Semaglutide Take 1 tablet (14 mg total) by mouth daily.   sodium hypochlorite external solution Commonly known as: DAKIN'S 1/2 STRENGTH Apply 1 Application topically daily at 12 noon. Topically to wound daily to clean.   Torsemide 40 MG Tabs Take by mouth.   Zinc Oxide 10 % Oint Apply topically.        Allergies:   Allergies  Allergen Reactions   Farxiga [Dapagliflozin] Rash    Causes a severe rash in groin area    Dulaglutide Diarrhea and Other (See Comments)   Liraglutide Diarrhea and Other (See Comments)   Jardiance [Empagliflozin] Rash    Family History: Family History  Problem Relation Age of Onset   Diabetes Father    Esophageal cancer Mother    Alzheimer's disease Paternal Uncle     Social History:   reports that he quit smoking about 33 years ago. His smoking use included cigarettes. He has a 30.00 pack-year smoking history. He has been exposed to tobacco smoke. He has never used smokeless tobacco. He reports that he does not drink alcohol and does not use drugs.  Physical Exam: There were no vitals taken for this visit.  Constitutional:  Alert and oriented, no acute distress, nontoxic appearing HEENT: [place], AT Cardiovascular: No clubbing, cyanosis, or edema Respiratory: Normal respiratory effort, no increased work of breathing Skin: No rashes, bruises or suspicious lesions Neurologic: Grossly intact, no focal deficits, moving all 4 extremities Psychiatric: Normal mood and affect  Laboratory Data: Results for orders placed or performed in visit on 06/25/22  Microscopic Examination   Urine  Result Value Ref Range   WBC, UA 11-30 (A) 0 - 5 /hpf   RBC, Urine 0-2 0 - 2 /hpf   Epithelial Cells (non renal) 0-10 0 - 10 /hpf   Mucus, UA Present (A) Not Estab.   Bacteria, UA Few None seen/Few  Urinalysis, Complete  Result Value Ref Range   Specific Gravity, UA 1.020 1.005 - 1.030   pH, UA 7.0 5.0 - 7.5   Color, UA Yellow Yellow   Appearance Ur Cloudy (A) Clear   Leukocytes,UA Trace (A) Negative   Protein,UA 3+ (A) Negative/Trace   Glucose, UA Trace (A) Negative   Ketones, UA Negative Negative   RBC, UA Trace (A) Negative   Bilirubin, UA Negative Negative   Urobilinogen, Ur 0.2 0.2 - 1.0 mg/dL   Nitrite, UA Negative Negative   Microscopic Examination See below:   Bladder  Scan (Post Void Residual) in office  Result Value Ref Range   Scan Result 16 ml    Assessment & Plan:   1. Urinary frequency PVR WNL.  UA today with mild pyuria.  We discussed that his symptoms are largely related to cessation of prolonged catheterization and there is no indication for Foley catheter replacement  at this time.  Will start 4 weeks of empiric doxycycline for possible underlying chronic bacterial prostatitis, which may also be contributory.  He is in agreement with this plan. - Urinalysis, Complete - Bladder Scan (Post Void Residual) in office - CULTURE, URINE COMPREHENSIVE - doxycycline (VIBRAMYCIN) 100 MG capsule; Take 1 capsule (100 mg total) by mouth 2 (two) times daily for 28 days.  Dispense: 56 capsule; Refill: 0 - Mycoplasma / ureaplasma culture   Return if symptoms worsen or fail to improve.  Debroah Loop, PA-C  [facility] [place] [address] [place], [place] [address] [phone]

## 2022-06-25 NOTE — Patient Instructions (Addendum)
We're checking your blood today.  Only things you need to do this week:  Take the medicine in the Pill Pack Take the doxycycline 2x a day in the bottle Try to get some rest Don't take B12 pills  We'll see you next week

## 2022-06-26 ENCOUNTER — Telehealth: Payer: Self-pay | Admitting: Family Medicine

## 2022-06-26 LAB — BASIC METABOLIC PANEL
BUN/Creatinine Ratio: 20 (ref 10–24)
BUN: 25 mg/dL (ref 8–27)
CO2: 22 mmol/L (ref 20–29)
Calcium: 10.5 mg/dL — ABNORMAL HIGH (ref 8.6–10.2)
Chloride: 98 mmol/L (ref 96–106)
Creatinine, Ser: 1.23 mg/dL (ref 0.76–1.27)
Glucose: 215 mg/dL — ABNORMAL HIGH (ref 70–99)
Potassium: 4.3 mmol/L (ref 3.5–5.2)
Sodium: 135 mmol/L (ref 134–144)
eGFR: 59 mL/min/{1.73_m2} — ABNORMAL LOW (ref >59)

## 2022-06-26 LAB — CBC WITH DIFFERENTIAL/PLATELET
Basophils Absolute: 0.1 10*3/uL (ref 0.0–0.2)
Basos: 1 %
EOS (ABSOLUTE): 0.2 10*3/uL (ref 0.0–0.4)
Eos: 2 %
Hematocrit: 29.9 % — ABNORMAL LOW (ref 37.5–51.0)
Hemoglobin: 9.4 g/dL — ABNORMAL LOW (ref 13.0–17.7)
Immature Grans (Abs): 0 10*3/uL (ref 0.0–0.1)
Immature Granulocytes: 1 %
Lymphocytes Absolute: 1.9 10*3/uL (ref 0.7–3.1)
Lymphs: 22 %
MCH: 25.9 pg — ABNORMAL LOW (ref 26.6–33.0)
MCHC: 31.4 g/dL — ABNORMAL LOW (ref 31.5–35.7)
MCV: 82 fL (ref 79–97)
Monocytes Absolute: 0.7 10*3/uL (ref 0.1–0.9)
Monocytes: 9 %
Neutrophils Absolute: 5.6 10*3/uL (ref 1.4–7.0)
Neutrophils: 65 %
Platelets: 322 10*3/uL (ref 150–450)
RBC: 3.63 x10E6/uL — ABNORMAL LOW (ref 4.14–5.80)
RDW: 14 % (ref 11.6–15.4)
WBC: 8.5 10*3/uL (ref 3.4–10.8)

## 2022-06-26 NOTE — Telephone Encounter (Unsigned)
Copied from CRM [id number]. Topic: General - Other >> Jun 26, 2022 11:49 AM Everette C wrote: Reason for CRM: Bonita Quin with [facility] has called regarding the patient's referral for home health care  The patient has spoken with [facility] and shared that the patient would like to have their home health care provided by [facility]   Please contact when possible

## 2022-06-26 NOTE — Telephone Encounter (Signed)
Thank you, I greatly appreciate it.

## 2022-06-28 DIAGNOSIS — I509 Heart failure, unspecified: Secondary | ICD-10-CM | POA: Diagnosis not present

## 2022-06-28 DIAGNOSIS — J449 Chronic obstructive pulmonary disease, unspecified: Secondary | ICD-10-CM | POA: Diagnosis not present

## 2022-06-28 DIAGNOSIS — I69351 Hemiplegia and hemiparesis following cerebral infarction affecting right dominant side: Secondary | ICD-10-CM | POA: Diagnosis not present

## 2022-06-29 NOTE — Assessment & Plan Note (Signed)
Rechecking labs today. Await results. Treat as needed.  °

## 2022-06-29 NOTE — Assessment & Plan Note (Signed)
Will start him on IM B12 to decrease pill burden. Call with any concerns.

## 2022-06-29 NOTE — Assessment & Plan Note (Signed)
Following with urology. S/P foley removal. Continue to follow with them. Take doxycycline as prescribed.  

## 2022-06-29 NOTE — Assessment & Plan Note (Signed)
Given clots in his urine, ?if he needs to continue his eliquis. Will get him back into see hematology for ?length of medication.

## 2022-06-29 NOTE — Assessment & Plan Note (Signed)
Not under good control- likely contributing to his polyuria. Will restart his meds through pill pack. Recheck 1 week.

## 2022-06-29 NOTE — Assessment & Plan Note (Signed)
Will start him on IM B12 to decrease pill burden. Call with any concerns.  

## 2022-06-29 NOTE — Assessment & Plan Note (Signed)
Following with urology. S/P foley removal. Continue to follow with them. Take doxycycline as prescribed.

## 2022-06-30 ENCOUNTER — Inpatient Hospital Stay: Payer: PPO

## 2022-06-30 ENCOUNTER — Other Ambulatory Visit: Payer: Self-pay

## 2022-06-30 ENCOUNTER — Inpatient Hospital Stay: Payer: PPO | Attending: Oncology | Admitting: Oncology

## 2022-06-30 ENCOUNTER — Encounter: Payer: Self-pay | Admitting: Oncology

## 2022-06-30 VITALS — BP 127/64 | HR 62 | Temp 96.8°F | Resp 18

## 2022-06-30 DIAGNOSIS — J449 Chronic obstructive pulmonary disease, unspecified: Secondary | ICD-10-CM | POA: Diagnosis not present

## 2022-06-30 DIAGNOSIS — M7989 Other specified soft tissue disorders: Secondary | ICD-10-CM | POA: Insufficient documentation

## 2022-06-30 DIAGNOSIS — I700 Atherosclerosis of aorta: Secondary | ICD-10-CM | POA: Insufficient documentation

## 2022-06-30 DIAGNOSIS — R5383 Other fatigue: Secondary | ICD-10-CM | POA: Diagnosis not present

## 2022-06-30 DIAGNOSIS — I2510 Atherosclerotic heart disease of native coronary artery without angina pectoris: Secondary | ICD-10-CM | POA: Diagnosis not present

## 2022-06-30 DIAGNOSIS — Z86711 Personal history of pulmonary embolism: Secondary | ICD-10-CM

## 2022-06-30 DIAGNOSIS — Z87891 Personal history of nicotine dependence: Secondary | ICD-10-CM | POA: Insufficient documentation

## 2022-06-30 DIAGNOSIS — Z79899 Other long term (current) drug therapy: Secondary | ICD-10-CM | POA: Insufficient documentation

## 2022-06-30 DIAGNOSIS — R319 Hematuria, unspecified: Secondary | ICD-10-CM | POA: Diagnosis not present

## 2022-06-30 DIAGNOSIS — I129 Hypertensive chronic kidney disease with stage 1 through stage 4 chronic kidney disease, or unspecified chronic kidney disease: Secondary | ICD-10-CM | POA: Insufficient documentation

## 2022-06-30 DIAGNOSIS — G4733 Obstructive sleep apnea (adult) (pediatric): Secondary | ICD-10-CM | POA: Insufficient documentation

## 2022-06-30 DIAGNOSIS — E785 Hyperlipidemia, unspecified: Secondary | ICD-10-CM | POA: Insufficient documentation

## 2022-06-30 DIAGNOSIS — M549 Dorsalgia, unspecified: Secondary | ICD-10-CM | POA: Diagnosis not present

## 2022-06-30 DIAGNOSIS — N1832 Chronic kidney disease, stage 3b: Secondary | ICD-10-CM | POA: Diagnosis not present

## 2022-06-30 DIAGNOSIS — D508 Other iron deficiency anemias: Secondary | ICD-10-CM

## 2022-06-30 DIAGNOSIS — Z85038 Personal history of other malignant neoplasm of large intestine: Secondary | ICD-10-CM | POA: Diagnosis not present

## 2022-06-30 DIAGNOSIS — Z833 Family history of diabetes mellitus: Secondary | ICD-10-CM | POA: Insufficient documentation

## 2022-06-30 DIAGNOSIS — R339 Retention of urine, unspecified: Secondary | ICD-10-CM | POA: Diagnosis not present

## 2022-06-30 DIAGNOSIS — Z818 Family history of other mental and behavioral disorders: Secondary | ICD-10-CM | POA: Diagnosis not present

## 2022-06-30 DIAGNOSIS — Z800 Family history of malignant neoplasm of digestive organs: Secondary | ICD-10-CM | POA: Diagnosis not present

## 2022-06-30 DIAGNOSIS — N400 Enlarged prostate without lower urinary tract symptoms: Secondary | ICD-10-CM | POA: Diagnosis not present

## 2022-06-30 DIAGNOSIS — D509 Iron deficiency anemia, unspecified: Secondary | ICD-10-CM | POA: Insufficient documentation

## 2022-06-30 DIAGNOSIS — R310 Gross hematuria: Secondary | ICD-10-CM | POA: Insufficient documentation

## 2022-06-30 LAB — CBC WITH DIFFERENTIAL (CANCER CENTER ONLY)
Abs Immature Granulocytes: 0.03 10*3/uL (ref 0.00–0.07)
Basophils Absolute: 0 10*3/uL (ref 0.0–0.1)
Basophils Relative: 1 %
Eosinophils Absolute: 0.3 10*3/uL (ref 0.0–0.5)
Eosinophils Relative: 4 %
HCT: 28.9 % — ABNORMAL LOW (ref 39.0–52.0)
Hemoglobin: 9 g/dL — ABNORMAL LOW (ref 13.0–17.0)
Immature Granulocytes: 0 %
Lymphocytes Relative: 27 %
Lymphs Abs: 2 10*3/uL (ref 0.7–4.0)
MCH: 26 pg (ref 26.0–34.0)
MCHC: 31.1 g/dL (ref 30.0–36.0)
MCV: 83.5 fL (ref 80.0–100.0)
Monocytes Absolute: 0.5 10*3/uL (ref 0.1–1.0)
Monocytes Relative: 7 %
Neutro Abs: 4.6 10*3/uL (ref 1.7–7.7)
Neutrophils Relative %: 61 %
Platelet Count: 288 10*3/uL (ref 150–400)
RBC: 3.46 MIL/uL — ABNORMAL LOW (ref 4.22–5.81)
RDW: 14.5 % (ref 11.5–15.5)
WBC Count: 7.5 10*3/uL (ref 4.0–10.5)
nRBC: 0 % (ref 0.0–0.2)

## 2022-06-30 LAB — IRON AND TIBC
Iron: 28 ug/dL — ABNORMAL LOW (ref 45–182)
Saturation Ratios: 7 % — ABNORMAL LOW (ref 17.9–39.5)
TIBC: 405 ug/dL (ref 250–450)
UIBC: 377 ug/dL

## 2022-06-30 LAB — CMP (CANCER CENTER ONLY)
ALT: 12 U/L (ref 0–44)
AST: 18 U/L (ref 15–41)
Albumin: 3.3 g/dL — ABNORMAL LOW (ref 3.5–5.0)
Alkaline Phosphatase: 66 U/L (ref 38–126)
Anion gap: 9 (ref 5–15)
BUN: 36 mg/dL — ABNORMAL HIGH (ref 8–23)
CO2: 23 mmol/L (ref 22–32)
Calcium: 9.5 mg/dL (ref 8.9–10.3)
Chloride: 99 mmol/L (ref 98–111)
Creatinine: 1.76 mg/dL — ABNORMAL HIGH (ref 0.61–1.24)
GFR, Estimated: 38 mL/min — ABNORMAL LOW
Glucose, Bld: 139 mg/dL — ABNORMAL HIGH (ref 70–99)
Potassium: 3.9 mmol/L (ref 3.5–5.1)
Sodium: 131 mmol/L — ABNORMAL LOW (ref 135–145)
Total Bilirubin: 0.5 mg/dL (ref 0.3–1.2)
Total Protein: 7.3 g/dL (ref 6.5–8.1)

## 2022-06-30 LAB — RETIC PANEL
Immature Retic Fract: 10.2 % (ref 2.3–15.9)
RBC.: 3.41 MIL/uL — ABNORMAL LOW (ref 4.22–5.81)
Retic Count, Absolute: 50.5 10*3/uL (ref 19.0–186.0)
Retic Ct Pct: 1.5 % (ref 0.4–3.1)
Reticulocyte Hemoglobin: 27.9 pg — ABNORMAL LOW

## 2022-06-30 LAB — FERRITIN: Ferritin: 13 ng/mL — ABNORMAL LOW (ref 24–336)

## 2022-06-30 MED ORDER — FERROUS SULFATE 325 (65 FE) MG PO TBEC: 325.0000 mg | DELAYED_RELEASE_TABLET | Freq: Two times a day (BID) | ORAL | 2 refills | Status: DC

## 2022-06-30 NOTE — Assessment & Plan Note (Signed)
Resolved.  Recommend patient to follow-up with urology for consideration of cystoscopy.  Patient knows to call neurology office if recurrent bleeding I discussed with urology Benard Rink .

## 2022-06-30 NOTE — Assessment & Plan Note (Addendum)
Recommend oral ferrous sulfate 325 mg twice daily.  Prescription sent to pharmacy. Potential side effects reviewed with patient.  He agrees with the plan. Repeat iron level in the next visit.  If remains low, consider IV iron infusion.

## 2022-06-30 NOTE — Progress Notes (Addendum)
Hematology/Oncology Progress note [phone] [phone]      CHIEF COMPLAINTS/REASON FOR VISIT:  history of PE  ASSESSMENT & PLAN:   History of pulmonary embolism 09/18/21 No obvious provoking events prior to diagnosis of pulmonary embolism.  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 Negative beta glycoprotein antibodies.   Patient has a recent episode of gross hematuria.  I had a lengthy discussion with patient and await advancement and risks of continuing anticoagulation.  Currently, hematuria has completely stopped.  Recommend continue Eliquis 2.5 mg twice daily.  Should his hematuria recurs, patient needs cystoscopy to further clarify etiology of hematuria. Patient knows to hold off blood thinners if hematuria recurs.  Continue Elqiuis 2.5mg  BID for prophylaxis.     IDA (iron deficiency anemia) Recommend oral ferrous sulfate 325 mg twice daily.  Prescription sent to pharmacy. Potential side effects reviewed with patient.  He agrees with the plan. Repeat iron level in the next visit.  If remains low, consider IV iron infusion.  Hematuria Resolved.  Recommend patient to follow-up with urology for consideration of cystoscopy.  Patient knows to call neurology office if recurrent bleeding I discussed with urology Benard Rink .   Orders Placed This Encounter  Procedures   CBC with Differential (Cancer Center Only)    Standing Status:   Future    Standing Expiration Date:   06/30/2023   Iron and TIBC    Standing Status:   Future    Standing Expiration Date:   06/30/2023   Ferritin    Standing Status:   Future    Standing Expiration Date:   06/30/2023   Retic Panel    Standing Status:   Future    Standing Expiration Date:   06/30/2023    Follow-up in 3 months. All questions were answered. The patient knows to call the clinic with any problems, questions or concerns.  Justin Patience, MD, PhD [facility] Hematology Oncology 06/30/2022    HISTORY OF PRESENTING ILLNESS:   Justin Griffith is a  82 y.o.  male with PMH listed below was seen in consultation at the request of  Dorcas Carrow, DO  for evaluation of history of pulmonary embolism.   Patient has extensive medical problems, including  BPH, status post HoLEP procedure 09/18/2021,  diastolic CHF, CAD status post CABG, OSA, DVT, COPD, dyslipidemia and hypertension, and history pulmonary embolism.   09/19/21 1 day after his urology surgery, he was found down in his front yard on the ground and was last seen around lunchtime. He was febrile and confused with EMS. He was hypoxic in ED  09/19/2021  Chest CTA revealed the following  1. Positive for pulmonary emboli. There are multiple bilateral segmental pulmonary emboli. Positive for acute PE with CT evidence of right heart strain (RV/LV Ratio = 1.15) consistent with at least submassive (intermediate risk) PE. The presence of right heart strain has been associated with an increased risk of morbidity and mortality. Please refer to the "Code PE Focused" order set in EPIC. 2. Lungs show evidence of vascular shunting and atelectasis, but no evidence of an infarct, pleural effusion or pneumothorax. 3. Mild enlargement of the main pulmonary artery tp 3.9 cm  Patient was treated with anticoagulation with heparin gtt, switched to Eliquis at discharge. Vascular surgeon recommend no intervention. He also was found to have  UTI and was treated with antibiotics.   He has no been on anticoagulation with Eliquis 5mg  BID for 5.5 months. He was referred to establish care for determination of duration of anticoagulation.   INTERVAL HISTORY Justin Griffith is a 82 y.o. male who has above history reviewed by me today presents for follow up visit for  pulmonary embolism. Patient takes Eliquis 2.5 mg daily.  Recently was hospitalized due to urinary retention, gross hematuria, BPH UTI Patient was seen during hospitalization.  Hematuria was felt to be probably secondary to UTI.  With antibiotic treatments, hematuria resolved. Patient was advised to take oral iron supplementation after his last visit.  He reports forgetting taking it. Today he denies nausea vomiting diarrhea fever or chills.    MEDICAL HISTORY:  Past Medical History:  Diagnosis Date   Acute urinary retention    Arthritis    Asthma 2000   Back pain    Colon polyp 2012   COPD (chronic obstructive pulmonary disease)    Emphysema of lung    Heart murmur    Hernia 2000   History of cold sores 05/06/2020   History of colon cancer    Hyperlipidemia    Hypertension    Personal history of malignant neoplasm of large intestine    Personal history of tobacco use, presenting hazards to health    Septic shock 11/18/2021   Sleep apnea    Special screening for malignant neoplasms, colon    Type 2 diabetes mellitus with proteinuria 10/11/2014    SURGICAL HISTORY: Past Surgical History:  Procedure Laterality Date   CARDIAC CATHETERIZATION Left 09/25/2015   Procedure: Left Heart Cath and Coronary Angiography;  Surgeon: Alwyn Pea, MD;  Location: [facility] INVASIVE CV LAB;  Service: Cardiovascular;  Laterality: Left;   CHOLECYSTECTOMY  1970   COLON SURGERY  07-16-99   sigmoid colon resection with primary anastomosis, chemotherapy for metastatic disease   COLONOSCOPY  2001, 2012   Dr Lemar Livings, tubular adenoma of the cecum and ascending colon in 2012.   COLONOSCOPY WITH PROPOFOL N/A 02/12/2016   Procedure: COLONOSCOPY WITH PROPOFOL;  Surgeon: Earline Mayotte, MD;  Location: [facility] ENDOSCOPY;  Service: Endoscopy;  Laterality: N/A;   CORONARY STENT INTERVENTION N/A 05/17/2020   Procedure: CORONARY STENT INTERVENTION;  Surgeon: Alwyn Pea, MD;  Location: [facility] INVASIVE CV  LAB;  Service: Cardiovascular;  Laterality: N/A;  LAD   ESOPHAGOGASTRODUODENOSCOPY (EGD) WITH PROPOFOL N/A 02/12/2016   Procedure: ESOPHAGOGASTRODUODENOSCOPY (EGD) WITH PROPOFOL;  Surgeon: Earline Mayotte, MD;  Location: [facility] ENDOSCOPY;  Service: Endoscopy;  Laterality: N/A;   HERNIA REPAIR Right    right inguinal hernia repair   IR 3D INDEPENDENT WKST  05/07/2022   IR ANGIOGRAM PELVIS SELECTIVE OR SUPRASELECTIVE  05/07/2022   IR ANGIOGRAM PELVIS SELECTIVE OR SUPRASELECTIVE  05/07/2022   IR ANGIOGRAM SELECTIVE EACH ADDITIONAL VESSEL  05/07/2022   IR EMBO ARTERIAL NOT HEMORR HEMANG INC GUIDE ROADMAPPING  05/07/2022   IR RADIOLOGIST EVAL & MGMT  02/27/2022   IR RADIOLOGIST EVAL & MGMT  03/25/2022   IR RADIOLOGIST EVAL & MGMT  06/01/2022   IR US GUIDE VASC ACCESS LEFT  05/07/2022   JOINT REPLACEMENT Bilateral    knee replacement   KNEE SURGERY Bilateral 2010   LEFT HEART CATH AND CORONARY ANGIOGRAPHY N/A 05/17/2020   Procedure: LEFT HEART CATH AND CORONARY ANGIOGRAPHY possible PCI and stent;  Surgeon: Alwyn Pea, MD;  Location: [facility] INVASIVE CV LAB;  Service: Cardiovascular;  Laterality: N/A;  MYRINGOTOMY WITH TUBE PLACEMENT Bilateral 08/16/2014   Procedure: MYRINGOTOMY WITH TUBE PLACEMENT;  Surgeon: Bud Face, MD;  Location: [facility] ORS;  Service: ENT;  Laterality: Bilateral;   PERIPHERAL VASCULAR CATHETERIZATION Right 09/04/2015   Procedure: Lower Extremity Angiography;  Surgeon: Renford Dills, MD;  Location: [facility] INVASIVE CV LAB;  Service: Cardiovascular;  Laterality: Right;   PERIPHERAL VASCULAR CATHETERIZATION  09/04/2015   Procedure: Lower Extremity Intervention;  Surgeon: Renford Dills, MD;  Location: [facility] INVASIVE CV LAB;  Service: Cardiovascular;;   TONSILLECTOMY     TYMPANOSTOMY TUBE PLACEMENT      SOCIAL HISTORY: Social History   Socioeconomic History   Marital status: Married    Spouse name: Not on file   Number of children: Not on file   Years of education: Not  on file   Highest education level: GED or equivalent  Occupational History   Occupation: retired    Comment: [employer]  Tobacco Use   Smoking status: Former    Packs/day: 1.00    Years: 30.00    Additional pack years: 0.00    Total pack years: 30.00    Types: Cigarettes    Quit date: 03/23/1989    Years since quitting: 33.2    Passive exposure: Past   Smokeless tobacco: Never  Vaping Use   Vaping Use: Never used  Substance and Sexual Activity   Alcohol use: No    Alcohol/week: 0.0 standard drinks of alcohol   Drug use: No   Sexual activity: Not Currently  Other Topics Concern   Not on file  Social History Narrative   [employer]    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    Social Determinants of Health   Financial Resource Strain: Low Risk  (03/03/2022)   Overall Financial Resource Strain (CARDIA)    Difficulty of Paying Living Expenses: Not hard at all  Food Insecurity: No Food Insecurity (06/23/2022)   Hunger Vital Sign    Worried About Running Out of Food in the Last Year: Never true    Ran Out of Food in the Last Year: Never true  Transportation Needs: No Transportation Needs (06/23/2022)   PRAPARE - Administrator, Civil Service (Medical): No    Lack of Transportation (Non-Medical): No  Physical Activity: Inactive (03/03/2022)   Exercise Vital Sign    Days of Exercise per Week: 0 days    Minutes of Exercise per Session: 0 min  Stress: No Stress Concern Present (03/03/2022)   [employer] of Occupational Health - Occupational Stress Questionnaire    Feeling of Stress : Only a little  Social Connections: Moderately Integrated (03/03/2022)   Social Connection and Isolation Panel [NHANES]    Frequency of Communication with Friends and Family: More than three times a week    Frequency of Social Gatherings with Friends and Family: More than three times a week    Attends Religious Services: More than 4 times per year    Active Member of [employer] or Organizations: No    Attends Occupational hygienist Meetings: Never    Marital Status: Married  Catering manager Violence: Not At Risk (06/13/2022)   Humiliation, Afraid, Rape, and Kick questionnaire    Fear of Current or Ex-Partner: No    Emotionally Abused: No    Physically Abused: No    Sexually Abused: No    FAMILY HISTORY: Family History  Problem Relation Age of Onset   Diabetes Father    Esophageal cancer Mother    Alzheimer's disease  Paternal Uncle     ALLERGIES:  is allergic to farxiga [dapagliflozin], dulaglutide, liraglutide, and jardiance [empagliflozin].  MEDICATIONS:  Current Outpatient Medications  Medication Sig Dispense Refill   acyclovir (ZOVIRAX) 400 MG tablet Take 1 tablet (400 mg total) by mouth 2 (two) times daily. 180 tablet 1   blood glucose meter kit and supplies 1 each by Other route daily. Dispense based on patient and insurance preference. Use up to four times daily as directed. (FOR ICD-10 E10.9, E11.9). 1 each 0   cloNIDine (CATAPRES) 0.1 MG tablet Take 1 tablet (0.1 mg total) by mouth 2 (two) times daily. For blood pressure 180 tablet 1   diclofenac Sodium (VOLTAREN) 1 % GEL Apply 4 g topically 4 (four) times daily. 150 g 3   doxycycline (VIBRAMYCIN) 100 MG capsule Take 1 capsule (100 mg total) by mouth 2 (two) times daily for 28 days. 56 capsule 0   ferrous sulfate 325 (65 FE) MG EC tablet Take 1 tablet (325 mg total) by mouth 2 (two) times daily with a meal. 60 tablet 2   gabapentin (NEURONTIN) 400 MG capsule Take 1 capsule (400 mg total) by mouth 3 (three) times daily. For nerve pain and itching (Patient taking differently: Take 100 mg by mouth 3 (three) times daily. For nerve pain and itching) 270 capsule 1   GNP VITAMIN C 500 MG tablet TAKE 1 TABLET BY MOUTH ONCE DAILY TO KEEP YOUR IMMUNE SYSTEM UP 30 tablet 0   hydrALAZINE (APRESOLINE) 100 MG tablet Take 1 tablet (100 mg total) by mouth 3 (three) times daily. for blood pressure 270 tablet 1   losartan (COZAAR) 100 MG tablet Take 1  tablet (100 mg total) by mouth daily. For blood pressure 90 tablet 1   metFORMIN (GLUCOPHAGE) 500 MG tablet Take 1 tablet (500 mg total) by mouth 2 (two) times daily with a meal. 180 tablet 1   Multiple Vitamin (MULTIVITAMIN WITH MINERALS) TABS tablet Take 1 tablet by mouth daily. For nutrition 30 tablet 2   nystatin (MYCOSTATIN/NYSTOP) powder Apply 1 Application topically 3 (three) times daily. 60 g 3   nystatin ointment (MYCOSTATIN) Apply 1 Application topically 2 (two) times daily. 30 g 0   Olopatadine HCl 0.2 % SOLN Apply 1 drop to eye daily. 2.5 mL 3   omeprazole (PRILOSEC) 40 MG capsule Take 1 capsule (40 mg total) by mouth daily. For heartburn 90 capsule 1   polyethylene glycol powder (GLYCOLAX/MIRALAX) 17 GM/SCOOP powder Take 17 g by mouth 2 (two) times daily as needed. 3350 g 1   rosuvastatin (CRESTOR) 10 MG tablet Take 1 tablet (10 mg total) by mouth at bedtime. For cholesterol 90 tablet 1   Semaglutide (RYBELSUS) 14 MG TABS Take 1 tablet (14 mg total) by mouth daily. 30 tablet 3   sodium hypochlorite (DAKIN'S 1/2 STRENGTH) external solution Apply 1 Application topically daily at 12 noon. Topically to wound daily to clean.     Torsemide 40 MG TABS Take by mouth.     umeclidinium bromide (INCRUSE ELLIPTA) 62.5 MCG/ACT AEPB Inhale 1 puff into the lungs daily. For your breathing 30 each 3   Zinc Oxide 10 % OINT Apply topically.     Current Facility-Administered Medications  Medication Dose Route Frequency Provider Last Rate Last Admin   cyanocobalamin (VITAMIN B12) injection 1,000 mcg  1,000 mcg Intramuscular Weekly Johnson, Megan P, DO   1,000 mcg at 06/25/22 1332   [START ON 07/25/2022] cyanocobalamin (VITAMIN B12) injection 1,000 mcg  1,000 mcg  Intramuscular Q30 days Olevia Perches P, DO        Review of Systems  Constitutional:  Positive for fatigue. Negative for chills, fever and unexpected weight change.  HENT:   Negative for hearing loss and voice change.   Eyes:  Negative for eye  problems and icterus.  Respiratory:  Negative for chest tightness, cough and shortness of breath.   Cardiovascular:  Positive for leg swelling. Negative for chest pain.  Gastrointestinal:  Negative for abdominal distention and abdominal pain.  Endocrine: Negative for hot flashes.  Genitourinary:  Positive for difficulty urinating. Negative for dysuria and frequency.   Musculoskeletal:  Positive for back pain. Negative for arthralgias.  Skin:  Negative for itching and rash.  Neurological:  Negative for light-headedness and numbness.  Hematological:  Negative for adenopathy. Bruises/bleeds easily.  Psychiatric/Behavioral:  Negative for confusion.    PHYSICAL EXAMINATION: Vitals:   06/30/22 1412  BP: 127/64  Pulse: 62  Resp: 18  Temp: (!) 96.8 F (36 C)  SpO2: 99%   There were no vitals filed for this visit.   Physical Exam Constitutional:      General: He is not in acute distress.    Appearance: He is obese.     Comments: Patient sits in the wheel chair.   HENT:     Head: Normocephalic and atraumatic.  Eyes:     General: No scleral icterus. Cardiovascular:     Rate and Rhythm: Normal rate.  Pulmonary:     Effort: Pulmonary effort is normal. No respiratory distress.     Breath sounds: No wheezing.  Abdominal:     General: Bowel sounds are normal. There is no distension.     Palpations: Abdomen is soft.  Genitourinary:    Comments: + Foley catheter Musculoskeletal:        General: No deformity. Normal range of motion.     Cervical back: Normal range of motion and neck supple.     Right lower leg: Edema present.  Skin:    General: Skin is warm and dry.     Findings: No erythema or rash.  Neurological:     Mental Status: He is alert and oriented to person, place, and time. Mental status is at baseline.     Cranial Nerves: No cranial nerve deficit.     Coordination: Coordination normal.  Psychiatric:        Mood and Affect: Mood normal.     LABORATORY DATA:  I  have reviewed the data as listed    Latest Ref Rng & Units 06/30/2022    2:00 PM 06/25/2022    1:40 PM 06/22/2022    5:19 AM  CBC  WBC 4.0 - 10.5 K/uL 7.5  8.5  6.4   Hemoglobin 13.0 - 17.0 g/dL 9.0  9.4  8.6   Hematocrit 39.0 - 52.0 % 28.9  29.9  27.8   Platelets 150 - 400 K/uL 288  322  280       Latest Ref Rng & Units 06/30/2022    2:00 PM 06/25/2022    1:40 PM 06/22/2022    5:19 AM  CMP  Glucose 70 - 99 mg/dL 161  096  045   BUN 8 - 23 mg/dL 36  25  53   Creatinine 0.61 - 1.24 mg/dL 4.09  8.11  9.14   Sodium 135 - 145 mmol/L 131  135  132   Potassium 3.5 - 5.1 mmol/L 3.9  4.3  4.5   Chloride 98 -  111 mmol/L 99  98  96   CO2 22 - 32 mmol/L 23  22  29    Calcium 8.9 - 10.3 mg/dL 9.5  81.1  9.3   Total Protein 6.5 - 8.1 g/dL 7.3     Total Bilirubin 0.3 - 1.2 mg/dL 0.5     Alkaline Phos 38 - 126 U/L 66     AST 15 - 41 U/L 18     ALT 0 - 44 U/L 12         RADIOGRAPHIC STUDIES: I have personally reviewed the radiological images as listed and agreed with the findings in the report. US RENAL  Result Date: 06/17/2022 CLINICAL DATA:  Acute renal failure EXAM: RENAL / URINARY TRACT ULTRASOUND COMPLETE COMPARISON:  CT 06/13/2022, CT 09/30/2021 FINDINGS: Right Kidney: Renal measurements: 11.8 x 5.3 x 5.9 cm = volume: 190.6 mL. No hydronephrosis. Mildly increased renal cortical echogenicity. There is a measurement on a pseudomass in the right kidney which has no correlate on prior contrast enhanced CT, likely represents lobular renal contour. Left Kidney: Renal measurements: 11.8 x 5.9 x 5.1 cm = volume: 188.6 mL. No hydronephrosis. Mildly increased renal cortical echogenicity. Bladder: Well distended but unremarkable. Other: None. IMPRESSION: No hydronephrosis. Mildly increased renal cortical echogenicity, as can be seen in medical renal disease. Electronically Signed   By: Caprice Renshaw M.D.   On: 06/17/2022 10:42   CT Renal Stone Study  Result Date: 06/13/2022 CLINICAL DATA:  Abdominal and flank  pain suspected kidney stone, hematuria, has catheter EXAM: CT ABDOMEN AND PELVIS WITHOUT CONTRAST TECHNIQUE: Multidetector CT imaging of the abdomen and pelvis was performed following the standard protocol without IV contrast. RADIATION DOSE REDUCTION: This exam was performed according to the departmental dose-optimization program which includes automated exposure control, adjustment of the mA and/or kV according to patient size and/or use of iterative reconstruction technique. COMPARISON:  01/30/2022 FINDINGS: Lower chest: Mild dependent atelectasis at lung bases. Hepatobiliary: Gallbladder surgically absent. Liver normal appearance. No biliary dilatation. Pancreas: Normal appearance Spleen: Normal appearance Adrenals/Urinary Tract: Adrenal glands normal appearance. BILATERAL perinephric stranding increased from previous exam. No hydronephrosis or hydroureter. Bladder is decompressed, contains minimal urine and air. Mild surrounding perivesicular infiltrative changes. Foley catheter balloon inflated within prostate gland. Stomach/Bowel: Scattered stool throughout colon. Sigmoid anastomosis. Bowel loops otherwise unremarkable. Stomach normal appearance. Appendix not visualized. Vascular/Lymphatic: Atherosclerotic calcifications aorta and iliac arteries without aneurysm. Additional vascular calcifications at coronary arteries and visceral arteries. No adenopathy. Reproductive: Enlarged prostate gland unchanged. Other: No free air or free fluid.  No hernia. Musculoskeletal: Osseous structures unremarkable. IMPRESSION: Foley catheter balloon inflated within prostate gland; recommend repositioning into bladder. Decompressed bladder with perivesicular stranding, as well as BILATERAL perinephric stranding, urinary tract infection/cystitis not excluded, recommend correlation with urinalysis. Enlarged prostate gland unchanged. No acute intra-abdominal or intrapelvic abnormalities. Aortic Atherosclerosis (ICD10-I70.0).  Electronically Signed   By: Ulyses Southward M.D.   On: 06/13/2022 15:34   IR Radiologist Eval & Mgmt  Result Date: 06/01/2022 EXAM: ESTABLISHED PATIENT OFFICE VISIT CHIEF COMPLAINT: See Epic note. HISTORY OF PRESENT ILLNESS: See Epic note. REVIEW OF SYSTEMS: See Epic note. PHYSICAL EXAMINATION: See Epic note. ASSESSMENT AND PLAN: See Epic note. [name], MD [facility] Electronically Signed   By: [name] M.D.   On: 06/01/2022 14:56   IR EMBO ARTERIAL NOT HEMORR HEMANG INC GUIDE ROADMAPPING  Result Date: 05/07/2022 INDICATION: 82 year old male with history of Foley catheter dependent secondary to benign prostatic  hyperplasia with prostate volume measuring over 200 g. EXAM: 1. Ultrasound-guided vascular access of the left radial artery. 2. Selective catheterization and angiography of the bilateral internal iliac, anterior divisions of the internal iliacs, and right prostatic arteries. 3. Cone beam CT. 4. Right prostatic artery particle embolization. MEDICATIONS: Ciprofloxacin 400 mg IV. The antibiotic was administered within 1 hour of the procedure ANESTHESIA/SEDATION: Moderate (conscious) sedation was employed during this procedure. A total of Versed 4 mg and Fentanyl 200 mcg was administered intravenously. Moderate Sedation Time: 130 minutes. The patient's level of consciousness and vital signs were monitored continuously by radiology nursing throughout the procedure under my direct supervision. CONTRAST:  50mL OMNIPAQUE IOHEXOL 300 MG/ML SOLN, 50mL OMNIPAQUE IOHEXOL 300 MG/ML SOLN, 30mL OMNIPAQUE IOHEXOL 300 MG/ML SOLN FLUOROSCOPY: Radiation Exposure Index (as provided by the fluoroscopic device): 3482 mGy Kerma COMPLICATIONS: None immediate. PROCEDURE: Informed consent was obtained from the patient following explanation of the procedure, risks, benefits and alternatives. The patient understands, agrees and consents for the procedure. All  questions were addressed. A time out was performed prior to the initiation of the procedure. Maximal barrier sterile technique utilized including caps, mask, sterile gowns, sterile gloves, large sterile drape, hand hygiene, and Betadine prep. The left wrist was prepped and draped in standard fashion. Pulse oximeter was attached to the left thumb. Tora Perches test was performed, grade A. The left radial artery measured 0.3 cm in diameter. Subdermal Local anesthesia was provided at the planned needle entry site with 1% lidocaine. A small skin nick was made. Under direct ultrasound visualization, the left radial artery was punctured with a 21 gauge micropuncture needle. A permanent image was captured and stored in the record. A microwire was placed and exchanged for a 4/5 French slender sheath. The sheath was flushed followed by installation of standard radial cocktail. A RAVI MG2 catheter and Bentson wire within directed under fluoroscopic guidance to the level of the aortic arch, thoracic aorta, and abdominal aorta followed by selection of the right internal iliac artery. Angiogram of the right internal iliac artery demonstrated conventional anatomy with [employer] A configuration as the right prostatic artery arises from the proximal aspect of the obturator artery. Next, a 1.9 [place] Progreat Lambda microcatheter and 0.014 inch fathom wire were used to select the right prostatic artery. Right prostatic angiogram was performed which demonstrated prominent vascular supply to the enlarged prostate gland. There was a significant degree of left-sided cross-filling arising from the right-sided branches. No evidence of vesicular or rectal supply. Two hundred mcg of nitroglycerin were minister from this location. Next, cone beam CT was performed which demonstrated prostatic opacification without evidence of non target arterial branch distribution. Right prostatic artery embolization was then performed with 400 micron  Hydropearls. The entire 20 cc syringe of embolic was used on the right side. Completion right prostatic angiogram demonstrated complete embolization with minimal residual stagnant antegrade flow in the proximal right prostatic artery. Due to significant tortuosity and multifocal branches of the proximal prostatic artery, the "PErFecTED" technique was not attempted. The microcatheter was removed. The MG2 catheter was then retracted and fluoroscopic guidance and used to select the left internal iliac artery. Left internal iliac angiogram was performed. There is a diminutive left prostatic artery arising from the proximal aspect of the anterior division of the internal iliac artery. Angiogram was performed in several obliquities each confirming diminutive prostatic artery without significant vascular supply to the prostate gland. Therefore, the procedure was terminated. Catheters were removed. The TR band was applied  to the left wrist and the sheath was removed without complication. The patient tolerated the procedure well was transferred to the recovery area in good condition. IMPRESSION: 1. Prostatomegaly with right-sided dominant prostatic artery supply (supplying over 90% of the prostate gland). 2. Technically successful right-sided prostatic artery embolization. PLAN: IR will arrange for 1 month follow up in clinic. Plan to follow up with Dr. Aleene Davidson for voiding trail.Phenazopyridine 100 mg TID x 7 days, ciprofloxacin 500 mg BID x 7 days. [name], MD [facility] Radiology Electronically Signed   By: [name] M.D.   On: 05/07/2022 16:24   IR US Guide Vasc Access Left  Result Date: 05/07/2022 INDICATION: 82 year old male with history of Foley catheter dependent secondary to benign prostatic hyperplasia with prostate volume measuring over 200 g. EXAM: 1. Ultrasound-guided vascular access of the left radial artery. 2. Selective catheterization and  angiography of the bilateral internal iliac, anterior divisions of the internal iliacs, and right prostatic arteries. 3. Cone beam CT. 4. Right prostatic artery particle embolization. MEDICATIONS: Ciprofloxacin 400 mg IV. The antibiotic was administered within 1 hour of the procedure ANESTHESIA/SEDATION: Moderate (conscious) sedation was employed during this procedure. A total of Versed 4 mg and Fentanyl 200 mcg was administered intravenously. Moderate Sedation Time: 130 minutes. The patient's level of consciousness and vital signs were monitored continuously by radiology nursing throughout the procedure under my direct supervision. CONTRAST:  50mL OMNIPAQUE IOHEXOL 300 MG/ML SOLN, 50mL OMNIPAQUE IOHEXOL 300 MG/ML SOLN, 30mL OMNIPAQUE IOHEXOL 300 MG/ML SOLN FLUOROSCOPY: Radiation Exposure Index (as provided by the fluoroscopic device): 3482 mGy Kerma COMPLICATIONS: None immediate. PROCEDURE: Informed consent was obtained from the patient following explanation of the procedure, risks, benefits and alternatives. The patient understands, agrees and consents for the procedure. All questions were addressed. A time out was performed prior to the initiation of the procedure. Maximal barrier sterile technique utilized including caps, mask, sterile gowns, sterile gloves, large sterile drape, hand hygiene, and Betadine prep. The left wrist was prepped and draped in standard fashion. Pulse oximeter was attached to the left thumb. Tora Perches test was performed, grade A. The left radial artery measured 0.3 cm in diameter. Subdermal Local anesthesia was provided at the planned needle entry site with 1% lidocaine. A small skin nick was made. Under direct ultrasound visualization, the left radial artery was punctured with a 21 gauge micropuncture needle. A permanent image was captured and stored in the record. A microwire was placed and exchanged for a 4/5 French slender sheath. The sheath was flushed followed by installation of  standard radial cocktail. A RAVI MG2 catheter and Bentson wire within directed under fluoroscopic guidance to the level of the aortic arch, thoracic aorta, and abdominal aorta followed by selection of the right internal iliac artery. Angiogram of the right internal iliac artery demonstrated conventional anatomy with [employer] A configuration as the right prostatic artery arises from the proximal aspect of the obturator artery. Next, a 1.9 [place] Progreat Lambda microcatheter and 0.014 inch fathom wire were used to select the right prostatic artery. Right prostatic angiogram was performed which demonstrated prominent vascular supply to the enlarged prostate gland. There was a significant degree of left-sided cross-filling arising from the right-sided branches. No evidence of vesicular or rectal supply. Two hundred mcg of nitroglycerin were minister from this location. Next, cone beam CT was performed which demonstrated prostatic opacification without evidence of non target arterial branch distribution. Right prostatic artery embolization was then performed with 400 micron  Hydropearls. The entire 20 cc syringe of embolic was used on the right side. Completion right prostatic angiogram demonstrated complete embolization with minimal residual stagnant antegrade flow in the proximal right prostatic artery. Due to significant tortuosity and multifocal branches of the proximal prostatic artery, the "PErFecTED" technique was not attempted. The microcatheter was removed. The MG2 catheter was then retracted and fluoroscopic guidance and used to select the left internal iliac artery. Left internal iliac angiogram was performed. There is a diminutive left prostatic artery arising from the proximal aspect of the anterior division of the internal iliac artery. Angiogram was performed in several obliquities each confirming diminutive prostatic artery without significant vascular supply to the prostate gland. Therefore, the  procedure was terminated. Catheters were removed. The TR band was applied to the left wrist and the sheath was removed without complication. The patient tolerated the procedure well was transferred to the recovery area in good condition. IMPRESSION: 1. Prostatomegaly with right-sided dominant prostatic artery supply (supplying over 90% of the prostate gland). 2. Technically successful right-sided prostatic artery embolization. PLAN: IR will arrange for 1 month follow up in clinic. Plan to follow up with Dr. Aleene Davidson for voiding trail.Phenazopyridine 100 mg TID x 7 days, ciprofloxacin 500 mg BID x 7 days. [name], MD [facility] Radiology Electronically Signed   By: [name] M.D.   On: 05/07/2022 16:24   IR Angiogram Pelvis Selective Or Supraselective  Result Date: 05/07/2022 INDICATION: 82 year old male with history of Foley catheter dependent secondary to benign prostatic hyperplasia with prostate volume measuring over 200 g. EXAM: 1. Ultrasound-guided vascular access of the left radial artery. 2. Selective catheterization and angiography of the bilateral internal iliac, anterior divisions of the internal iliacs, and right prostatic arteries. 3. Cone beam CT. 4. Right prostatic artery particle embolization. MEDICATIONS: Ciprofloxacin 400 mg IV. The antibiotic was administered within 1 hour of the procedure ANESTHESIA/SEDATION: Moderate (conscious) sedation was employed during this procedure. A total of Versed 4 mg and Fentanyl 200 mcg was administered intravenously. Moderate Sedation Time: 130 minutes. The patient's level of consciousness and vital signs were monitored continuously by radiology nursing throughout the procedure under my direct supervision. CONTRAST:  50mL OMNIPAQUE IOHEXOL 300 MG/ML SOLN, 50mL OMNIPAQUE IOHEXOL 300 MG/ML SOLN, 30mL OMNIPAQUE IOHEXOL 300 MG/ML SOLN FLUOROSCOPY: Radiation Exposure Index (as provided by the fluoroscopic  device): 3482 mGy Kerma COMPLICATIONS: None immediate. PROCEDURE: Informed consent was obtained from the patient following explanation of the procedure, risks, benefits and alternatives. The patient understands, agrees and consents for the procedure. All questions were addressed. A time out was performed prior to the initiation of the procedure. Maximal barrier sterile technique utilized including caps, mask, sterile gowns, sterile gloves, large sterile drape, hand hygiene, and Betadine prep. The left wrist was prepped and draped in standard fashion. Pulse oximeter was attached to the left thumb. Tora Perches test was performed, grade A. The left radial artery measured 0.3 cm in diameter. Subdermal Local anesthesia was provided at the planned needle entry site with 1% lidocaine. A small skin nick was made. Under direct ultrasound visualization, the left radial artery was punctured with a 21 gauge micropuncture needle. A permanent image was captured and stored in the record. A microwire was placed and exchanged for a 4/5 French slender sheath. The sheath was flushed followed by installation of standard radial cocktail. A RAVI MG2 catheter and Bentson wire within directed under fluoroscopic guidance to the level of the aortic arch, thoracic aorta, and abdominal  aorta followed by selection of the right internal iliac artery. Angiogram of the right internal iliac artery demonstrated conventional anatomy with [employer] A configuration as the right prostatic artery arises from the proximal aspect of the obturator artery. Next, a 1.9 [place] Progreat Lambda microcatheter and 0.014 inch fathom wire were used to select the right prostatic artery. Right prostatic angiogram was performed which demonstrated prominent vascular supply to the enlarged prostate gland. There was a significant degree of left-sided cross-filling arising from the right-sided branches. No evidence of vesicular or rectal supply. Two hundred mcg of  nitroglycerin were minister from this location. Next, cone beam CT was performed which demonstrated prostatic opacification without evidence of non target arterial branch distribution. Right prostatic artery embolization was then performed with 400 micron Hydropearls. The entire 20 cc syringe of embolic was used on the right side. Completion right prostatic angiogram demonstrated complete embolization with minimal residual stagnant antegrade flow in the proximal right prostatic artery. Due to significant tortuosity and multifocal branches of the proximal prostatic artery, the "PErFecTED" technique was not attempted. The microcatheter was removed. The MG2 catheter was then retracted and fluoroscopic guidance and used to select the left internal iliac artery. Left internal iliac angiogram was performed. There is a diminutive left prostatic artery arising from the proximal aspect of the anterior division of the internal iliac artery. Angiogram was performed in several obliquities each confirming diminutive prostatic artery without significant vascular supply to the prostate gland. Therefore, the procedure was terminated. Catheters were removed. The TR band was applied to the left wrist and the sheath was removed without complication. The patient tolerated the procedure well was transferred to the recovery area in good condition. IMPRESSION: 1. Prostatomegaly with right-sided dominant prostatic artery supply (supplying over 90% of the prostate gland). 2. Technically successful right-sided prostatic artery embolization. PLAN: IR will arrange for 1 month follow up in clinic. Plan to follow up with Dr. Aleene Davidson for voiding trail.Phenazopyridine 100 mg TID x 7 days, ciprofloxacin 500 mg BID x 7 days. [name], MD [facility] Radiology Electronically Signed   By: [name] M.D.   On: 05/07/2022 16:24   IR Angiogram Pelvis Selective Or Supraselective  Result Date:  05/07/2022 INDICATION: 82 year old male with history of Foley catheter dependent secondary to benign prostatic hyperplasia with prostate volume measuring over 200 g. EXAM: 1. Ultrasound-guided vascular access of the left radial artery. 2. Selective catheterization and angiography of the bilateral internal iliac, anterior divisions of the internal iliacs, and right prostatic arteries. 3. Cone beam CT. 4. Right prostatic artery particle embolization. MEDICATIONS: Ciprofloxacin 400 mg IV. The antibiotic was administered within 1 hour of the procedure ANESTHESIA/SEDATION: Moderate (conscious) sedation was employed during this procedure. A total of Versed 4 mg and Fentanyl 200 mcg was administered intravenously. Moderate Sedation Time: 130 minutes. The patient's level of consciousness and vital signs were monitored continuously by radiology nursing throughout the procedure under my direct supervision. CONTRAST:  50mL OMNIPAQUE IOHEXOL 300 MG/ML SOLN, 50mL OMNIPAQUE IOHEXOL 300 MG/ML SOLN, 30mL OMNIPAQUE IOHEXOL 300 MG/ML SOLN FLUOROSCOPY: Radiation Exposure Index (as provided by the fluoroscopic device): 3482 mGy Kerma COMPLICATIONS: None immediate. PROCEDURE: Informed consent was obtained from the patient following explanation of the procedure, risks, benefits and alternatives. The patient understands, agrees and consents for the procedure. All questions were addressed. A time out was performed prior to the initiation of the procedure. Maximal barrier sterile technique utilized including caps, mask, sterile gowns, sterile gloves, large  sterile drape, hand hygiene, and Betadine prep. The left wrist was prepped and draped in standard fashion. Pulse oximeter was attached to the left thumb. Tora Perches test was performed, grade A. The left radial artery measured 0.3 cm in diameter. Subdermal Local anesthesia was provided at the planned needle entry site with 1% lidocaine. A small skin nick was made. Under direct ultrasound  visualization, the left radial artery was punctured with a 21 gauge micropuncture needle. A permanent image was captured and stored in the record. A microwire was placed and exchanged for a 4/5 French slender sheath. The sheath was flushed followed by installation of standard radial cocktail. A RAVI MG2 catheter and Bentson wire within directed under fluoroscopic guidance to the level of the aortic arch, thoracic aorta, and abdominal aorta followed by selection of the right internal iliac artery. Angiogram of the right internal iliac artery demonstrated conventional anatomy with [employer] A configuration as the right prostatic artery arises from the proximal aspect of the obturator artery. Next, a 1.9 [place] Progreat Lambda microcatheter and 0.014 inch fathom wire were used to select the right prostatic artery. Right prostatic angiogram was performed which demonstrated prominent vascular supply to the enlarged prostate gland. There was a significant degree of left-sided cross-filling arising from the right-sided branches. No evidence of vesicular or rectal supply. Two hundred mcg of nitroglycerin were minister from this location. Next, cone beam CT was performed which demonstrated prostatic opacification without evidence of non target arterial branch distribution. Right prostatic artery embolization was then performed with 400 micron Hydropearls. The entire 20 cc syringe of embolic was used on the right side. Completion right prostatic angiogram demonstrated complete embolization with minimal residual stagnant antegrade flow in the proximal right prostatic artery. Due to significant tortuosity and multifocal branches of the proximal prostatic artery, the "PErFecTED" technique was not attempted. The microcatheter was removed. The MG2 catheter was then retracted and fluoroscopic guidance and used to select the left internal iliac artery. Left internal iliac angiogram was performed. There is a diminutive left  prostatic artery arising from the proximal aspect of the anterior division of the internal iliac artery. Angiogram was performed in several obliquities each confirming diminutive prostatic artery without significant vascular supply to the prostate gland. Therefore, the procedure was terminated. Catheters were removed. The TR band was applied to the left wrist and the sheath was removed without complication. The patient tolerated the procedure well was transferred to the recovery area in good condition. IMPRESSION: 1. Prostatomegaly with right-sided dominant prostatic artery supply (supplying over 90% of the prostate gland). 2. Technically successful right-sided prostatic artery embolization. PLAN: IR will arrange for 1 month follow up in clinic. Plan to follow up with Dr. Aleene Davidson for voiding trail.Phenazopyridine 100 mg TID x 7 days, ciprofloxacin 500 mg BID x 7 days. [name], MD [facility] Radiology Electronically Signed   By: [name] M.D.   On: 05/07/2022 16:24   IR Angiogram Selective Each Additional Vessel  Result Date: 05/07/2022 INDICATION: 82 year old male with history of Foley catheter dependent secondary to benign prostatic hyperplasia with prostate volume measuring over 200 g. EXAM: 1. Ultrasound-guided vascular access of the left radial artery. 2. Selective catheterization and angiography of the bilateral internal iliac, anterior divisions of the internal iliacs, and right prostatic arteries. 3. Cone beam CT. 4. Right prostatic artery particle embolization. MEDICATIONS: Ciprofloxacin 400 mg IV. The antibiotic was administered within 1 hour of the procedure ANESTHESIA/SEDATION: Moderate (conscious) sedation was employed during  this procedure. A total of Versed 4 mg and Fentanyl 200 mcg was administered intravenously. Moderate Sedation Time: 130 minutes. The patient's level of consciousness and vital signs were monitored continuously by  radiology nursing throughout the procedure under my direct supervision. CONTRAST:  50mL OMNIPAQUE IOHEXOL 300 MG/ML SOLN, 50mL OMNIPAQUE IOHEXOL 300 MG/ML SOLN, 30mL OMNIPAQUE IOHEXOL 300 MG/ML SOLN FLUOROSCOPY: Radiation Exposure Index (as provided by the fluoroscopic device): 3482 mGy Kerma COMPLICATIONS: None immediate. PROCEDURE: Informed consent was obtained from the patient following explanation of the procedure, risks, benefits and alternatives. The patient understands, agrees and consents for the procedure. All questions were addressed. A time out was performed prior to the initiation of the procedure. Maximal barrier sterile technique utilized including caps, mask, sterile gowns, sterile gloves, large sterile drape, hand hygiene, and Betadine prep. The left wrist was prepped and draped in standard fashion. Pulse oximeter was attached to the left thumb. Tora Perches test was performed, grade A. The left radial artery measured 0.3 cm in diameter. Subdermal Local anesthesia was provided at the planned needle entry site with 1% lidocaine. A small skin nick was made. Under direct ultrasound visualization, the left radial artery was punctured with a 21 gauge micropuncture needle. A permanent image was captured and stored in the record. A microwire was placed and exchanged for a 4/5 French slender sheath. The sheath was flushed followed by installation of standard radial cocktail. A RAVI MG2 catheter and Bentson wire within directed under fluoroscopic guidance to the level of the aortic arch, thoracic aorta, and abdominal aorta followed by selection of the right internal iliac artery. Angiogram of the right internal iliac artery demonstrated conventional anatomy with [employer] A configuration as the right prostatic artery arises from the proximal aspect of the obturator artery. Next, a 1.9 [place] Progreat Lambda microcatheter and 0.014 inch fathom wire were used to select the right prostatic artery. Right prostatic  angiogram was performed which demonstrated prominent vascular supply to the enlarged prostate gland. There was a significant degree of left-sided cross-filling arising from the right-sided branches. No evidence of vesicular or rectal supply. Two hundred mcg of nitroglycerin were minister from this location. Next, cone beam CT was performed which demonstrated prostatic opacification without evidence of non target arterial branch distribution. Right prostatic artery embolization was then performed with 400 micron Hydropearls. The entire 20 cc syringe of embolic was used on the right side. Completion right prostatic angiogram demonstrated complete embolization with minimal residual stagnant antegrade flow in the proximal right prostatic artery. Due to significant tortuosity and multifocal branches of the proximal prostatic artery, the "PErFecTED" technique was not attempted. The microcatheter was removed. The MG2 catheter was then retracted and fluoroscopic guidance and used to select the left internal iliac artery. Left internal iliac angiogram was performed. There is a diminutive left prostatic artery arising from the proximal aspect of the anterior division of the internal iliac artery. Angiogram was performed in several obliquities each confirming diminutive prostatic artery without significant vascular supply to the prostate gland. Therefore, the procedure was terminated. Catheters were removed. The TR band was applied to the left wrist and the sheath was removed without complication. The patient tolerated the procedure well was transferred to the recovery area in good condition. IMPRESSION: 1. Prostatomegaly with right-sided dominant prostatic artery supply (supplying over 90% of the prostate gland). 2. Technically successful right-sided prostatic artery embolization. PLAN: IR will arrange for 1 month follow up in clinic. Plan to follow up with Dr. Aleene Davidson for voiding trail.Phenazopyridine  100 mg TID x 7 days,  ciprofloxacin 500 mg BID x 7 days. [name], MD [facility] Radiology Electronically Signed   By: [name] M.D.   On: 05/07/2022 16:24   IR 3D Independent Annabell Sabal  Result Date: 05/07/2022 INDICATION: 82 year old male with history of Foley catheter dependent secondary to benign prostatic hyperplasia with prostate volume measuring over 200 g. EXAM: 1. Ultrasound-guided vascular access of the left radial artery. 2. Selective catheterization and angiography of the bilateral internal iliac, anterior divisions of the internal iliacs, and right prostatic arteries. 3. Cone beam CT. 4. Right prostatic artery particle embolization. MEDICATIONS: Ciprofloxacin 400 mg IV. The antibiotic was administered within 1 hour of the procedure ANESTHESIA/SEDATION: Moderate (conscious) sedation was employed during this procedure. A total of Versed 4 mg and Fentanyl 200 mcg was administered intravenously. Moderate Sedation Time: 130 minutes. The patient's level of consciousness and vital signs were monitored continuously by radiology nursing throughout the procedure under my direct supervision. CONTRAST:  50mL OMNIPAQUE IOHEXOL 300 MG/ML SOLN, 50mL OMNIPAQUE IOHEXOL 300 MG/ML SOLN, 30mL OMNIPAQUE IOHEXOL 300 MG/ML SOLN FLUOROSCOPY: Radiation Exposure Index (as provided by the fluoroscopic device): 3482 mGy Kerma COMPLICATIONS: None immediate. PROCEDURE: Informed consent was obtained from the patient following explanation of the procedure, risks, benefits and alternatives. The patient understands, agrees and consents for the procedure. All questions were addressed. A time out was performed prior to the initiation of the procedure. Maximal barrier sterile technique utilized including caps, mask, sterile gowns, sterile gloves, large sterile drape, hand hygiene, and Betadine prep. The left wrist was prepped and draped in standard fashion. Pulse oximeter was attached to the left thumb.  Tora Perches test was performed, grade A. The left radial artery measured 0.3 cm in diameter. Subdermal Local anesthesia was provided at the planned needle entry site with 1% lidocaine. A small skin nick was made. Under direct ultrasound visualization, the left radial artery was punctured with a 21 gauge micropuncture needle. A permanent image was captured and stored in the record. A microwire was placed and exchanged for a 4/5 French slender sheath. The sheath was flushed followed by installation of standard radial cocktail. A RAVI MG2 catheter and Bentson wire within directed under fluoroscopic guidance to the level of the aortic arch, thoracic aorta, and abdominal aorta followed by selection of the right internal iliac artery. Angiogram of the right internal iliac artery demonstrated conventional anatomy with [employer] A configuration as the right prostatic artery arises from the proximal aspect of the obturator artery. Next, a 1.9 [place] Progreat Lambda microcatheter and 0.014 inch fathom wire were used to select the right prostatic artery. Right prostatic angiogram was performed which demonstrated prominent vascular supply to the enlarged prostate gland. There was a significant degree of left-sided cross-filling arising from the right-sided branches. No evidence of vesicular or rectal supply. Two hundred mcg of nitroglycerin were minister from this location. Next, cone beam CT was performed which demonstrated prostatic opacification without evidence of non target arterial branch distribution. Right prostatic artery embolization was then performed with 400 micron Hydropearls. The entire 20 cc syringe of embolic was used on the right side. Completion right prostatic angiogram demonstrated complete embolization with minimal residual stagnant antegrade flow in the proximal right prostatic artery. Due to significant tortuosity and multifocal branches of the proximal prostatic artery, the "PErFecTED" technique was not  attempted. The microcatheter was removed. The MG2 catheter was then retracted and fluoroscopic guidance and used to select the left internal  iliac artery. Left internal iliac angiogram was performed. There is a diminutive left prostatic artery arising from the proximal aspect of the anterior division of the internal iliac artery. Angiogram was performed in several obliquities each confirming diminutive prostatic artery without significant vascular supply to the prostate gland. Therefore, the procedure was terminated. Catheters were removed. The TR band was applied to the left wrist and the sheath was removed without complication. The patient tolerated the procedure well was transferred to the recovery area in good condition. IMPRESSION: 1. Prostatomegaly with right-sided dominant prostatic artery supply (supplying over 90% of the prostate gland). 2. Technically successful right-sided prostatic artery embolization. PLAN: IR will arrange for 1 month follow up in clinic. Plan to follow up with Dr. Aleene Davidson for voiding trail.Phenazopyridine 100 mg TID x 7 days, ciprofloxacin 500 mg BID x 7 days. [name], MD [facility] Radiology Electronically Signed   By: [name] M.D.   On: 05/07/2022 16:24

## 2022-06-30 NOTE — Assessment & Plan Note (Addendum)
No obvious provoking events prior to diagnosis of pulmonary embolism.  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 Negative beta glycoprotein antibodies.   Patient has a recent episode of gross hematuria.  I had a lengthy discussion with patient and await advancement and risks of continuing anticoagulation.  Currently, hematuria has completely stopped.  Recommend continue Eliquis 2.5 mg twice daily.  Should his hematuria recurs, patient needs cystoscopy to further clarify etiology of hematuria. Patient knows to hold off blood thinners if hematuria recurs.  Continue Elqiuis 2.5mg  BID for prophylaxis.

## 2022-07-01 LAB — MYCOPLASMA / UREAPLASMA CULTURE
Mycoplasma hominis Culture: NEGATIVE
Ureaplasma urealyticum: NEGATIVE

## 2022-07-01 LAB — CULTURE, URINE COMPREHENSIVE

## 2022-07-02 ENCOUNTER — Telehealth: Payer: Self-pay

## 2022-07-02 ENCOUNTER — Encounter: Payer: Self-pay | Admitting: Family Medicine

## 2022-07-02 ENCOUNTER — Ambulatory Visit (INDEPENDENT_AMBULATORY_CARE_PROVIDER_SITE_OTHER): Payer: PPO | Admitting: Family Medicine

## 2022-07-02 VITALS — BP 175/77 | HR 64

## 2022-07-02 DIAGNOSIS — L89150 Pressure ulcer of sacral region, unstageable: Secondary | ICD-10-CM

## 2022-07-02 DIAGNOSIS — R350 Frequency of micturition: Secondary | ICD-10-CM

## 2022-07-02 DIAGNOSIS — K219 Gastro-esophageal reflux disease without esophagitis: Secondary | ICD-10-CM

## 2022-07-02 DIAGNOSIS — R339 Retention of urine, unspecified: Secondary | ICD-10-CM

## 2022-07-02 DIAGNOSIS — D519 Vitamin B12 deficiency anemia, unspecified: Secondary | ICD-10-CM

## 2022-07-02 DIAGNOSIS — Z86711 Personal history of pulmonary embolism: Secondary | ICD-10-CM

## 2022-07-02 MED ORDER — FAMOTIDINE 40 MG PO TABS: 40.0000 mg | ORAL_TABLET | Freq: Every day | ORAL | 0 refills | Status: DC

## 2022-07-02 NOTE — Progress Notes (Unsigned)
BP (!) 175/77   Pulse 64   SpO2 98%    Subjective:    Patient ID: Justin Griffith, male    DOB: [birth year], 82 y.o.   MRN: [id number]  HPI: Justin Griffith is a 82 y.o. male  Chief Complaint  Patient presents with   Urinary Retention   Heartburn    Patient says he has had heartburn for the past two days and says he has tried over the counter medication. Patient says it is not helping at all.    Not peeing every 10-15 minutes. Now peeing every to 1 hour.  Relevant past medical, surgical, family and social history reviewed and updated as indicated. Interim medical history since our last visit reviewed. Allergies and medications reviewed and updated.  Review of Systems  Constitutional: Negative.   Respiratory: Negative.    Cardiovascular: Negative.   Gastrointestinal: Negative.        + heartburn   Musculoskeletal: Negative.   Psychiatric/Behavioral: Negative.      Per HPI unless specifically indicated above     Objective:    BP (!) 175/77   Pulse 64   SpO2 98%   Wt Readings from Last 3 Encounters:  06/20/22 247 lb 12.8 oz (112.4 kg)  06/10/22 249 lb 4.8 oz (113.1 kg)  06/09/22 244 lb (110.7 kg)    Physical Exam Vitals and nursing note reviewed.  Constitutional:      General: He is not in acute distress.    Appearance: Normal appearance. He is obese. He is not ill-appearing, toxic-appearing or diaphoretic.  HENT:     Head: Normocephalic and atraumatic.     Right Ear: External ear normal.     Left Ear: External ear normal.     Nose: Nose normal.     Mouth/Throat:     Mouth: Mucous membranes are moist.     Pharynx: Oropharynx is clear.  Eyes:     General: No scleral icterus.       Right eye: No discharge.        Left eye: No discharge.     Extraocular Movements: Extraocular movements intact.     Conjunctiva/sclera: Conjunctivae normal.     Pupils: Pupils are equal, round, and reactive to light.  Cardiovascular:     Rate and Rhythm: Normal rate and  regular rhythm.     Pulses: Normal pulses.     Heart sounds: Normal heart sounds. No murmur heard.    No friction rub. No gallop.  Pulmonary:     Effort: Pulmonary effort is normal. No respiratory distress.     Breath sounds: Normal breath sounds. No stridor. No wheezing, rhonchi or rales.  Chest:     Chest wall: No tenderness.  Musculoskeletal:        General: Normal range of motion.     Cervical back: Normal range of motion and neck supple.  Skin:    General: Skin is warm and dry.     Capillary Refill: Capillary refill takes less than 2 seconds.     Coloration: Skin is not jaundiced or pale.     Findings: No bruising, erythema, lesion or rash.  Neurological:     General: No focal deficit present.     Mental Status: He is alert and oriented to person, place, and time. Mental status is at baseline.  Psychiatric:        Mood and Affect: Mood normal.        Behavior: Behavior normal.  Thought Content: Thought content normal.        Judgment: Judgment normal.     Results for orders placed or performed in visit on 06/30/22  Retic Panel  Result Value Ref Range   Retic Ct Pct 1.5 0.4 - 3.1 %   RBC. 3.41 (L) 4.22 - 5.81 MIL/uL   Retic Count, Absolute 50.5 19.0 - 186.0 K/uL   Immature Retic Fract 10.2 2.3 - 15.9 %   Reticulocyte Hemoglobin 27.9 (L) >27.9 pg  Ferritin  Result Value Ref Range   Ferritin 13 (L) 24 - 336 ng/mL  Iron and TIBC  Result Value Ref Range   Iron 28 (L) 45 - 182 ug/dL   TIBC 413405 244250 - 010450 ug/dL   Saturation Ratios 7 (L) 17.9 - 39.5 %   UIBC 377 ug/dL  CBC with Differential (Cancer Center Only)  Result Value Ref Range   WBC Count 7.5 4.0 - 10.5 K/uL   RBC 3.46 (L) 4.22 - 5.81 MIL/uL   Hemoglobin 9.0 (L) 13.0 - 17.0 g/dL   HCT 27.228.9 (L) 53.639.0 - 64.452.0 %   MCV 83.5 80.0 - 100.0 fL   MCH 26.0 26.0 - 34.0 pg   MCHC 31.1 30.0 - 36.0 g/dL   RDW 03.414.5 74.211.5 - 59.515.5 %   Platelet Count 288 150 - 400 K/uL   nRBC 0.0 0.0 - 0.2 %   Neutrophils Relative % 61 %    Neutro Abs 4.6 1.7 - 7.7 K/uL   Lymphocytes Relative 27 %   Lymphs Abs 2.0 0.7 - 4.0 K/uL   Monocytes Relative 7 %   Monocytes Absolute 0.5 0.1 - 1.0 K/uL   Eosinophils Relative 4 %   Eosinophils Absolute 0.3 0.0 - 0.5 K/uL   Basophils Relative 1 %   Basophils Absolute 0.0 0.0 - 0.1 K/uL   Immature Granulocytes 0 %   Abs Immature Granulocytes 0.03 0.00 - 0.07 K/uL  CMP (Cancer Center only)  Result Value Ref Range   Sodium 131 (L) 135 - 145 mmol/L   Potassium 3.9 3.5 - 5.1 mmol/L   Chloride 99 98 - 111 mmol/L   CO2 23 22 - 32 mmol/L   Glucose, Bld 139 (H) 70 - 99 mg/dL   BUN 36 (H) 8 - 23 mg/dL   Creatinine 6.381.76 (H) 7.560.61 - 1.24 mg/dL   Calcium 9.5 8.9 - 43.310.3 mg/dL   Total Protein 7.3 6.5 - 8.1 g/dL   Albumin 3.3 (L) 3.5 - 5.0 g/dL   AST 18 15 - 41 U/L   ALT 12 0 - 44 U/L   Alkaline Phosphatase 66 38 - 126 U/L   Total Bilirubin 0.5 0.3 - 1.2 mg/dL   GFR, Estimated 38 (L) >60 mL/min   Anion gap 9 5 - 15      Assessment & Plan:   Problem List Items Addressed This Visit   None    Follow up plan: No follow-ups on file.

## 2022-07-02 NOTE — Telephone Encounter (Signed)
Patient is here in the office today, patient says he would like to have [facility] services as he does not want [facility] and I know we have had this discussion before. Patient says he does not want [facility] at all. Please advise?

## 2022-07-03 MED ORDER — APIXABAN 2.5 MG PO TABS: 2.5000 mg | ORAL_TABLET | Freq: Two times a day (BID) | ORAL | 1 refills | Status: DC

## 2022-07-05 NOTE — Assessment & Plan Note (Signed)
Continue to follow with urology. Call with any concerns. Continue to monitor.  

## 2022-07-05 NOTE — Assessment & Plan Note (Signed)
Likely exacerbated by his doxycycline. Will start pepcid. Call with any concerns.

## 2022-07-05 NOTE — Assessment & Plan Note (Signed)
Hematology recommends continuing eliquis. Will get it restarted. Continue to monitor. Call with any concerns.

## 2022-07-05 NOTE — Assessment & Plan Note (Signed)
Cannot get home health in for nursing. Will get him into wound care. Call with any concerns.

## 2022-07-07 ENCOUNTER — Encounter: Payer: PPO | Attending: Physician Assistant | Admitting: Physician Assistant

## 2022-07-07 DIAGNOSIS — L89151 Pressure ulcer of sacral region, stage 1: Secondary | ICD-10-CM | POA: Diagnosis not present

## 2022-07-07 DIAGNOSIS — M6281 Muscle weakness (generalized): Secondary | ICD-10-CM | POA: Diagnosis not present

## 2022-07-07 DIAGNOSIS — Z6822 Body mass index (BMI) 22.0-22.9, adult: Secondary | ICD-10-CM | POA: Insufficient documentation

## 2022-07-07 DIAGNOSIS — E669 Obesity, unspecified: Secondary | ICD-10-CM | POA: Diagnosis not present

## 2022-07-08 NOTE — Progress Notes (Signed)
WALT, GEATHERS ([id number]) [id number] Page 1 of 5 Visit Report for 07/07/2022 Allergy List Details Patient Name: Date of Service: Justin Griffith GER F. 07/07/2022 2:45 PM Medical Record Number: [id number] Patient Account Number: [id number] Date of Birth/Sex: Treating Griffith: 1940-10-01 (82 y.o. Justin Griffith Primary Care Sonny Griffith: Justin Griffith Other Clinician: Referring Justin Griffith: Treating Justin Griffith/Extender: Justin Griffith in Treatment: 0 Allergies Active Allergies Farxiga dulaglutide liraglutide Jardiance Allergy Notes Electronic Signature(s) Signed: 07/08/2022 9:13:35 AM By: Justin Griffith, BSN, Griffith, CWS, Justin Griffith, BSN Entered By: Justin Griffith, BSN, Griffith, CWS, Justin on 07/07/2022 14:34:17 -------------------------------------------------------------------------------- Arrival Information Details Patient Name: Date of Service: Justin Griffith, RO GER F. 07/07/2022 2:45 PM Medical Record Number: [id number] Patient Account Number: [id number] Date of Birth/Sex: Treating Griffith: 04-13-1940 (82 y.o. Justin Griffith Primary Care Justin Griffith: Justin Griffith Other Clinician: Referring Justin Griffith: Treating Justin Griffith/Extender: Justin Griffith in Treatment: 0 Visit Information Patient Arrived: Justin Griffith Arrival Time: 14:23 Accompanied By: wife Transfer Assistance: None Patient Identification Verified: Yes Secondary Verification Process Completed: Yes Patient Has Alerts: Yes Patient Alerts: Patient on Blood Thinner Eliquis Type II Diabetic Electronic Signature(s) Signed: 07/08/2022 9:13:35 AM By: Justin Griffith, BSN, Griffith, CWS, Justin Griffith, BSN Entered By: Justin Griffith, BSN, Griffith, CWS, Justin on 07/07/2022 62:95:28 -------------------------------------------------------------------------------- Clinic Level of Care Assessment Details Patient Name: Date of Service: Justin Griffith, Justin Griffith GER F. 07/07/2022 2:45 PM Medical Record Number: [id number] Patient Account Number: [id number] Date of Birth/Sex: Treating  Griffith: 01-11-1941 (82 y.o. Justin Griffith Primary Care Justin Griffith: Justin Griffith Other Clinician: Referring Justin Griffith: Treating Justin Griffith/Extender: Justin Griffith in Treatment: 0 Clinic Level of Care Assessment Items Justin Griffith, Justin Griffith ([id number]) [id number] Page 2 of 5 TOOL 1 Quantity Score  - 0 Use when EandM and Procedure is performed on INITIAL visit ASSESSMENTS - Nursing Assessment / Reassessment X- 1 20 General Physical Exam (combine w/ comprehensive assessment (listed just below) when performed on new pt. evals) X- 1 25 Comprehensive Assessment (HX, ROS, Risk Assessments, Wounds Hx, etc.) ASSESSMENTS - Wound and Skin Assessment / Reassessment  - 0 Dermatologic / Skin Assessment (not related to wound area) ASSESSMENTS - Ostomy and/or Continence Assessment and Care  - 0 Incontinence Assessment and Management  - 0 Ostomy Care Assessment and Management (repouching, etc.) PROCESS - Coordination of Care X - Simple Patient / Family Education for ongoing care 1 15  - 0 Complex (extensive) Patient / Family Education for ongoing care X- 1 10 Staff obtains Chiropractor, Records, T Results / Process Orders est  - 0 Staff telephones HHA, Nursing Homes / Clarify orders / etc  - 0 Routine Transfer to another Facility (non-emergent condition)  - 0 Routine Hospital Admission (non-emergent condition) X- 1 15 New Admissions / Manufacturing engineer / Ordering NPWT Apligraf, etc. ,  - 0 Emergency Hospital Admission (emergent condition) PROCESS - Special Needs  - 0 Pediatric / Minor Patient Management  - 0 Isolation Patient Management  - 0 Hearing / Language / Visual special needs  - 0 Assessment of Community assistance (transportation, D/C planning, etc.)  - 0 Additional assistance / Altered mentation  - 0 Support Surface(s) Assessment (bed, cushion, seat, etc.) INTERVENTIONS - Miscellaneous  - 0 External ear  exam  - 0 Patient Transfer (multiple staff / Nurse, adult / Similar devices)  - 0 Simple Staple / Suture removal (25 or less)  - 0 Complex Staple / Suture removal (26 or more)  - 0 Hypo/Hyperglycemic Management (do not check if billed separately)  - 0  Ankle / Brachial Index (ABI) - do not check if billed separately Has the patient been seen at the hospital within the last three years: Yes Total Score: 85 Level Of Care: New/Established - Level 3 Electronic Signature(s) Signed: 07/08/2022 9:13:35 AM By: Justin Griffith, BSN, Griffith, CWS, Justin Griffith, BSN Entered By: Justin Griffith, BSN, Griffith, CWS, Justin on 07/07/2022 15:36:35 -------------------------------------------------------------------------------- Encounter Discharge Information Details Patient Name: Date of Service: Justin Griffith, RO GER F. 07/07/2022 2:45 PM Medical Record Number: [id number] Patient Account Number: [id number] Date of Birth/Sex: Treating Griffith: 05-16-1940 (82 y.o. Justin Griffith Primary Care Jarin Cornfield: Justin Griffith Other Clinician: Referring Elridge Stemm: Treating Secret Kristensen/Extender: Justin Griffith in Treatment: 0 Encounter Discharge Information Items Justin Griffith, Justin Griffith ([id number]) [id number] Page 3 of 5 Discharge Condition: Stable Ambulatory Status: Walker Discharge Destination: Home Transportation: Private Auto Accompanied By: wife Schedule Follow-up Appointment: Yes Clinical Summary of Care: Electronic Signature(s) Signed: 07/08/2022 9:13:35 AM By: Justin Griffith, BSN, Griffith, CWS, Justin Griffith, BSN Entered By: Justin Griffith, BSN, Griffith, CWS, Justin on 07/07/2022 15:38:35 -------------------------------------------------------------------------------- Lower Extremity Assessment Details Patient Name: Date of Service: Justin Griffith, RO GER F. 07/07/2022 2:45 PM Medical Record Number: [id number] Patient Account Number: [id number] Date of Birth/Sex: Treating Griffith: 01-Apr-1940 (82 y.o. Justin Griffith Primary Care Cheryl Stabenow: Justin Griffith Other  Clinician: Referring Shaquoya Cosper: Treating Ebonique Hallstrom/Extender: Justin Griffith in Treatment: 0 Electronic Signature(s) Signed: 07/08/2022 9:13:35 AM By: Justin Griffith, BSN, Griffith, CWS, Justin Griffith, BSN Entered By: Justin Griffith, BSN, Griffith, CWS, Justin on 07/07/2022 14:41:36 -------------------------------------------------------------------------------- Multi Wound Chart Details Patient Name: Date of Service: Justin Griffith, RO GER F. 07/07/2022 2:45 PM Medical Record Number: [id number] Patient Account Number: [id number] Date of Birth/Sex: Treating Griffith: 05/27/1940 (82 y.o. Justin Griffith Primary Care Anjali Manzella: Justin Griffith Other Clinician: Referring Talor Cheema: Treating Hannan Tetzlaff/Extender: Justin Griffith in Treatment: 0 Vital Signs Height(in): 71 Pulse(bpm): 72 Weight(lbs): 162 Blood Pressure(mmHg): 160/80 Body Mass Index(BMI): 22.6 Temperature(F): 98.0 Respiratory Rate(breaths/min): 16 [Treatment Notes:Wound Assessments Treatment Notes] Electronic Signature(s) Signed: 07/08/2022 9:13:35 AM By: Justin Griffith, BSN, Griffith, CWS, Justin Griffith, BSN Entered By: Justin Griffith, BSN, Griffith, CWS, Justin on 07/07/2022 15:35:16 -------------------------------------------------------------------------------- Non-Wound Condition Assessment Details Patient Name: Date of Service: Justin Griffith, RO GER F. 07/07/2022 2:45 PM Medical Record Number: [id number] Patient Account Number: [id number] Date of Birth/Sex: Treating Griffith: [birth year] (82 y.o. Justin Griffith Primary Care Marvelyn Bouchillon: Justin Griffith Other Clinician: Referring Carold Eisner: Treating Myleen Brailsford/Extender: Justin Griffith in Treatment: 0 Non-Wound Condition: Condition: Other Dermatologic Condition Location: Gluteus Side: Bilateral Justin Griffith, Justin Griffith ([id number]) [id number] Page 4 of 5 Notes slightly red with dry skin Electronic Signature(s) Signed: 07/08/2022 9:13:35 AM By: Justin Griffith, BSN, Griffith, CWS, Justin Griffith, BSN Entered By: Justin Griffith, BSN, Griffith, CWS,  Justin on 07/07/2022 15:17:26 -------------------------------------------------------------------------------- Pain Assessment Details Patient Name: Date of Service: Justin Griffith, RO GER F. 07/07/2022 2:45 PM Medical Record Number: [id number] Patient Account Number: [id number] Date of Birth/Sex: Treating Griffith: [birth year] (82 y.o. Justin Griffith Primary Care Tyra Michelle: Justin Griffith Other Clinician: Referring Kanyla Omeara: Treating Tyeasha Ebbs/Extender: Justin Griffith in Treatment: 0 Active Problems Location of Pain Severity and Description of Pain Patient Has Paino Yes Site Locations Pain Location: Pain in Ulcers Duration of the Pain. Constant / Intermittento Intermittent Rate the pain. Current Pain Level: 7 Pain Management and Medication Current Pain Management: Notes mainly when sitting Electronic Signature(s) Signed: 07/08/2022 9:13:35 AM By: Justin Griffith, BSN, Griffith, CWS, Justin Griffith, BSN Entered By: Justin Griffith, BSN, Griffith, CWS, Justin on 07/07/2022 14:29:11 -------------------------------------------------------------------------------- Patient/Caregiver Education Details Patient Name:  Date of Service: Miki Kins 4/16/2024andnbsp2:45 PM Medical Record Number: [id number] Patient Account Number: [id number] Date of Birth/Gender: Treating Griffith: [birth year] (82 y.o. Justin Griffith Primary Care Physician: Justin Griffith Other Clinician: Referring Physician: Treating Physician/Extender: Justin Griffith in Treatment: 0 Education Assessment Education Provided To: Patient Education Topics Provided MACLAN, SOBCZAK ([id number]) [id number] Page 5 of 5 Pressure: Handouts: Pressure Injury: Prevention and Offloading, Other: stand every hour to assist blood flow Methods: Explain/Verbal Responses: State content correctly Electronic Signature(s) Signed: 07/08/2022 9:13:35 AM By: Justin Griffith, BSN, Griffith, CWS, Justin Griffith, BSN Entered By: Justin Griffith, BSN, Griffith, CWS, Justin on 07/07/2022  15:37:36 -------------------------------------------------------------------------------- Vitals Details Patient Name: Date of Service: Justin Griffith, RO GER F. 07/07/2022 2:45 PM Medical Record Number: [id number] Patient Account Number: [id number] Date of Birth/Sex: Treating Griffith: 01-23-1941 (82 y.o. Justin Griffith Primary Care Miner Koral: Justin Griffith Other Clinician: Referring Celena Lanius: Treating Eliazar Olivar/Extender: Justin Griffith in Treatment: 0 Vital Signs Time Taken: 14:31 Temperature (F): 98.0 Height (in): 71 Pulse (bpm): 72 Weight (lbs): 162 Respiratory Rate (breaths/min): 16 Body Mass Index (BMI): 22.6 Blood Pressure (mmHg): 160/80 Reference Range: 80 - 120 mg / dl Electronic Signature(s) Signed: 07/08/2022 9:13:35 AM By: Justin Griffith, BSN, Griffith, CWS, Justin Griffith, BSN Entered By: Justin Griffith, BSN, Griffith, CWS, Justin on 07/07/2022 14:32:44

## 2022-07-08 NOTE — Progress Notes (Signed)
CAIRO, AGOSTINELLI F ([id number]) [phone] [id number] Page 1 of 4 Visit Report for 07/07/2022 Abuse Risk Screen Details Patient Name: Date of Service: Justin Griffith GER F. 07/07/2022 2:45 PM Medical Record Number: [id number] Patient Account Number: [id number] Date of Birth/Sex: Treating RN: June 02, 1940 (82 y.o. Justin Griffith Primary Care Maejor Erven: Olevia Perches Other Clinician: Referring Dandy Lazaro: Treating Jadien Lehigh/Extender: Marena Chancy Weeks in Treatment: 0 Abuse Risk Screen Items Answer ABUSE RISK SCREEN: Has anyone close to you tried to hurt or harm you recentlyo No Do you feel uncomfortable with anyone in your familyo No Has anyone forced you do things that you didnt want to doo No Electronic Signature(s) Signed: 07/08/2022 9:13:35 AM By: Elliot Gurney, BSN, RN, CWS, Kim RN, BSN Entered By: Elliot Gurney, BSN, RN, CWS, Kim on 07/07/2022 14:39:20 -------------------------------------------------------------------------------- Activities of Daily Living Details Patient Name: Date of Service: Justin Griffith, [name] GER F. 07/07/2022 2:45 PM Medical Record Number: [id number] Patient Account Number: [id number] Date of Birth/Sex: Treating RN: [birth year] (82 y.o. Justin Griffith Primary Care Ryiah Bellissimo: Olevia Perches Other Clinician: Referring Shaquaya Wuellner: Treating Mahesh Sizemore/Extender: Marena Chancy Weeks in Treatment: 0 Activities of Daily Living Items Answer Activities of Daily Living (Please select one for each item) Drive Automobile Not Able T Medications ake Completely Able Use T elephone Completely Able Care for Appearance Completely Able Use T oilet Completely Able Bath / Shower Completely Able Dress Self Completely Able Feed Self Completely Able Walk Completely Able Get In / Out Bed Completely Able Housework Need Assistance Prepare Meals Need Assistance Handle Money Need Assistance Shop for Self Need Assistance Electronic Signature(s) Signed: 07/08/2022  9:13:35 AM By: Elliot Gurney, BSN, RN, CWS, Kim RN, BSN Entered By: Elliot Gurney, BSN, RN, CWS, Kim on 07/07/2022 14:39:52 -------------------------------------------------------------------------------- Education Screening Details Patient Name: Date of Service: Justin Griffith, [place] GER F. 07/07/2022 2:45 PM Medical Record Number: [id number] Patient Account Number: [id number] Date of Birth/Sex: Treating RN: [birth year] (82 y.o. Justin Griffith Primary Care Elasha Tess: Olevia Perches Other Clinician: Referring Snow Peoples: Treating Persephanie Laatsch/Extender: Marena Chancy Weeks in Treatment: 0 [employer], [place] F ([id number]) [id number] Page 2 of 4 Primary Learner Assessed: Patient Learning Preferences/Education Level/Primary Language Preferred Language: English Cognitive Barrier Language Barrier: No Translator Needed: No Memory Deficit: No Emotional Barrier: No Cultural/Religious Beliefs Affecting Medical Care: No Knowledge/Comprehension Knowledge Level: High Comprehension Level: High Ability to understand written instructions: High Ability to understand verbal instructions: High Motivation Anxiety Level: Calm Cooperation: Cooperative Education Importance: Acknowledges Need Interest in Health Problems: Asks Questions Perception: Coherent Willingness to Engage in Self-Management High Activities: Readiness to Engage in Self-Management High Activities: Psychologist, prison and probation services) Signed: 07/08/2022 9:13:35 AM By: Elliot Gurney, BSN, RN, CWS, Kim RN, BSN Entered By: Elliot Gurney, BSN, RN, CWS, Kim on 07/07/2022 14:40:13 -------------------------------------------------------------------------------- Fall Risk Assessment Details Patient Name: Date of Service: Justin Griffith, [place] GER F. 07/07/2022 2:45 PM Medical Record Number: [id number] Patient Account Number: [id number] Date of Birth/Sex: Treating RN: [birth year] (81 y.o. Justin Griffith Primary Care Valerye Kobus: Olevia Perches Other Clinician: Referring  [place]: Treating Novali Vollman/Extender: Marena Chancy Weeks in Treatment: 0 Fall Risk Assessment Items Have you had 2 or more falls in the last 12 monthso 0 Yes Have you had any fall that resulted in injury in the last 12 monthso 0 Yes FALLS RISK SCREEN History of falling - immediate or within 3 months 0 No Secondary diagnosis (Do you have 2 or more medical diagnoseso) 0 No Ambulatory aid None/bed rest/wheelchair/nurse 0 No Crutches/cane/walker 15 Yes Furniture 0 No Intravenous therapy  Access/Saline/Heparin Lock 0 No Gait/Transferring Normal/ bed rest/ wheelchair 0 No Weak (short steps with or without shuffle, stooped but able to lift head while walking, may seek 0 No support from furniture) Impaired (short steps with shuffle, may have difficulty arising from chair, head down, impaired 20 Yes balance) Mental Status Oriented to own ability 0 Yes Electronic Signature(s) Signed: 07/08/2022 9:13:35 AM By: Elliot Gurney, BSN, RN, CWS, Kim RN, BSN Entered By: Elliot Gurney, BSN, RN, CWS, Kim on 07/07/2022 14:40:36 Sherald Hess ([id number]) [phone] [id number] Page 3 of 4 -------------------------------------------------------------------------------- Foot Assessment Details Patient Name: Date of Service: Justin Griffith GER F. 07/07/2022 2:45 PM Medical Record Number: [id number] Patient Account Number: [id number] Date of Birth/Sex: Treating RN: 1940/11/22 (82 y.o. 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 07/07/2022 2:45 PM Medical Record Number: [id number] Patient Account Number: [id number] Date of Birth/Sex: Treating RN: July 18, 1940 (82 y.o. Justin Griffith Primary Care Lakeasha Petion: Olevia Perches Other Clinician: Referring Neta Upadhyay: Treating Karrisa Didio/Extender: Marena Chancy Weeks in Treatment: 0 Height (in): 71 Weight (lbs): 162 Body Mass Index (BMI): 22.6 Nutrition Risk Screening Items Score Screening NUTRITION RISK SCREENANTHONYJOHN, BAGE ([id number]) [id number] Page 4 of 4 I have an illness or condition that made me change the kind and/or amount of food I eat 0 No I eat fewer than two meals per day 0 No I eat few fruits and vegetables, or milk products 0 No I have three or more drinks of beer, liquor or wine almost every day 0 No I have tooth or mouth problems that make it hard for me to eat 0 No I don't always have enough money to buy the food I need 0 No I eat alone most of the time 0 No I take three or more different prescribed or over-the-counter drugs a day 1 Yes Without wanting to, I have lost or gained 10 pounds in the last six months 0 No I am not always physically able to shop, cook and/or feed myself 0 No Nutrition Protocols Good Risk Protocol 0 No interventions needed Moderate Risk Protocol High Risk Proctocol Risk Level: Good Risk Score: 1 Electronic Signature(s) Signed: 07/08/2022 9:13:35 AM By: Elliot Gurney, BSN, RN, CWS, Kim RN, BSN Entered By: Elliot Gurney, BSN, RN, CWS, Kim on 07/07/2022 14:41:02

## 2022-07-08 NOTE — Progress Notes (Signed)
STEPAN, VERRETTE ([id number]) [id number] Page 1 of 6 Visit Report for 07/07/2022 Chief Complaint Document Details Patient Name: Date of Service: Justin Griffith GER F. 07/07/2022 2:45 PM Medical Record Number: [id number] Patient Account Number: [id number] Date of Birth/Sex: Treating RN: [birth year] (82 y.o. Justin Griffith Primary Care Provider: Olevia Griffith Other Clinician: Referring Provider: Treating Provider/Extender: Justin Griffith in Treatment: 0 Information Obtained from: Patient Chief Complaint Pressure injury to sacral region without open wound Electronic Signature(s) Signed: 07/07/2022 3:20:09 PM By: Justin Derry PA-C Entered By: Justin Griffith on 07/07/2022 15:20:09 -------------------------------------------------------------------------------- HPI Details Patient Name: Date of Service: Justin Griffith, Justin Griffith GER F. 07/07/2022 2:45 PM Medical Record Number: [id number] Patient Account Number: [id number] Date of Birth/Sex: Treating RN: September 18, 1940 (82 y.o. Justin Griffith Primary Care Provider: Olevia Griffith Other Clinician: Referring Provider: Treating Provider/Extender: Justin Griffith in Treatment: 0 History of Present Illness HPI Description: 07-07-2022 upon evaluation today patient presents for initial inspection here in our clinic concerning issues has been having with soreness in the gluteal/sacral region. He unfortunately is generally weak having a difficult time even being able to stand he is getting a lift chair to help with getting up out of a seated position. He has been have a lot of trouble as well with urinary incontinence not due to the fact that he is just leaking but secondary to the fact that he has to go quickly to the bathroom and when he is not able to get up quickly this causes an issue. Fortunately there does not appear to be any signs of active infection locally nor systemically which is great news. Patient does  have a history of obesity and generalized weakness which definitely is affecting things. I do not see any specific open wound. Electronic Signature(s) Signed: 07/07/2022 3:54:29 PM By: Justin Derry PA-C Entered By: Justin Griffith on 07/07/2022 15:54:29 -------------------------------------------------------------------------------- Physical Exam Details Patient Name: Date of Service: Justin Griffith, Justin Griffith GER F. 07/07/2022 2:45 PM Medical Record Number: [id number] Patient Account Number: [id number] Date of Birth/Sex: Treating RN: December 20, 1940 (82 y.o. Justin Griffith Primary Care Provider: Olevia Griffith Other Clinician: Referring Provider: Treating Provider/Extender: Justin Griffith in Treatment: 0 Constitutional patient is hypertensive.. pulse regular and within target range for patient.Marland Kitchen respirations regular, non-labored and within target range for patient.Marland Kitchen temperature within target range for patient.. Obese and well-hydrated in no acute distress. Eyes conjunctiva clear no eyelid edema noted. pupils equal round and reactive to light and accommodation. Ears, Nose, Mouth, and Throat no gross abnormality of ear auricles or external auditory canals. normal hearing noted during conversation. mucus membranes moist. [name], [name] ([id number]) [id number] Page 2 of 6 Respiratory normal breathing without difficulty. Musculoskeletal Patient unable to walk without assistance. Psychiatric this patient is able to make decisions and demonstrates good insight into disease process. Alert and Oriented x 3. pleasant and cooperative. Notes Upon inspection patient did not have any open wound in the gluteal/sacral region at all. I discussed that with him today. Nonetheless he does have an issue with pressure getting to this region although I think this is more of a stage I issue nothing actually open which is both good news and bad news. The biggest issue is simply that he is  unable to get up from a seated position very easily this requires quite a tremendous amount of work which means can be difficult for him to get up and down easily. Electronic Signature(s) Signed: 07/07/2022 3:56:19 PM By: Larina Bras,  Keiden Deskin PA-C Entered By: Justin Griffith on 07/07/2022 15:56:19 -------------------------------------------------------------------------------- Physician Orders Details Patient Name: Date of Service: Justin Griffith, [name] GER F. 07/07/2022 2:45 PM Medical Record Number: [id number] Patient Account Number: [id number] Date of Birth/Sex: Treating RN: [birth year] (82 y.o. Justin Griffith Primary Care Provider: Olevia Griffith Other Clinician: Referring Provider: Treating Provider/Extender: Justin Griffith in Treatment: 0 Verbal / Phone Orders: No Diagnosis Coding ICD-10 Coding Code Description L89.151 Pressure ulcer of sacral region, stage 1 M62.81 Muscle weakness (generalized) Discharge From [facility] Services Consult Only - No open wound, AandD Ointment to dry areas on buttocks. Electronic Signature(s) Signed: 07/07/2022 4:53:35 PM By: Justin Derry PA-C Signed: 07/08/2022 9:13:35 AM By: Justin Griffith, BSN, RN, CWS, Kim RN, BSN Entered By: Justin Griffith, BSN, RN, CWS, Justin Griffith on 07/07/2022 15:36:14 -------------------------------------------------------------------------------- Problem List Details Patient Name: Date of Service: Justin Griffith, Justin Griffith GER F. 07/07/2022 2:45 PM Medical Record Number: [id number] Patient Account Number: [id number] Date of Birth/Sex: Treating RN: 1940/12/11 (82 y.o. Justin Griffith Primary Care Provider: Olevia Griffith Other Clinician: Referring Provider: Treating Provider/Extender: Justin Griffith in Treatment: 0 Active Problems ICD-10 Encounter Code Description Active Date MDM Diagnosis L89.151 Pressure ulcer of sacral region, stage 1 07/07/2022 No Yes M62.81 Muscle weakness (generalized) 07/07/2022 No Yes [name], [name] ([id number])  [id number] Page 3 of 6 Inactive Problems Resolved Problems Electronic Signature(s) Signed: 07/07/2022 3:19:42 PM By: Justin Derry PA-C Entered By: Justin Griffith on 07/07/2022 15:19:42 -------------------------------------------------------------------------------- Progress Note Details Patient Name: Date of Service: Justin Griffith, Justin Griffith GER F. 07/07/2022 2:45 PM Medical Record Number: [id number] Patient Account Number: [id number] Date of Birth/Sex: Treating RN: 03-25-1940 (82 y.o. Justin Griffith Primary Care Provider: Olevia Griffith Other Clinician: Referring Provider: Treating Provider/Extender: Justin Griffith in Treatment: 0 Subjective Chief Complaint Information obtained from Patient Pressure injury to sacral region without open wound History of Present Illness (HPI) 07-07-2022 upon evaluation today patient presents for initial inspection here in our clinic concerning issues has been having with soreness in the gluteal/sacral region. He unfortunately is generally weak having a difficult time even being able to stand he is getting a lift chair to help with getting up out of a seated position. He has been have a lot of trouble as well with urinary incontinence not due to the fact that he is just leaking but secondary to the fact that he has to go quickly to the bathroom and when he is not able to get up quickly this causes an issue. Fortunately there does not appear to be any signs of active infection locally nor systemically which is great news. Patient does have a history of obesity and generalized weakness which definitely is affecting things. I do not see any specific open wound. Patient History Allergies Farxiga, dulaglutide, liraglutide, Jardiance Social History Former smoker - ended on 08/22/1990, Marital Status - Married, Alcohol Use - Never, Drug Use - No History, Caffeine Use - Daily. Medical History Respiratory Patient has history of Chronic  Obstructive Pulmonary Disease (COPD) Cardiovascular Patient has history of Hypertension Endocrine Patient has history of Type II Diabetes Musculoskeletal Patient has history of Osteoarthritis Neurologic Patient has history of Neuropathy - legs, feet Oncologic Patient has history of Received Chemotherapy Patient is treated with Oral Agents. Blood sugar is not tested. Review of Systems (ROS) Constitutional Symptoms (General Health) Complains or has symptoms of Fatigue. Denies complaints or symptoms of Fever, Chills, Marked Weight Change. Eyes Denies complaints or symptoms of Dry Eyes, Vision Changes, Glasses /  Contacts. Ear/Nose/Mouth/Throat Denies complaints or symptoms of Difficult clearing ears, Sinusitis. Hematologic/Lymphatic Denies complaints or symptoms of Bleeding / Clotting Disorders, Human Immunodeficiency Virus. Gastrointestinal Denies complaints or symptoms of Frequent diarrhea, Nausea, Vomiting. Genitourinary Denies complaints or symptoms of Kidney failure/ Dialysis, Incontinence/dribbling. Immunological Denies complaints or symptoms of Hives, Itching. Integumentary (Skin) Complains or has symptoms of Wounds. Oncologic colon cancer Justin Griffith, Justin Griffith ([id number]) [id number] Page 4 of 6 Objective Constitutional patient is hypertensive.. pulse regular and within target range for patient.Marland Kitchen respirations regular, non-labored and within target range for patient.Marland Kitchen temperature within target range for patient.. Obese and well-hydrated in no acute distress. Vitals Time Taken: 2:31 PM, Height: 71 in, Weight: 162 lbs, BMI: 22.6, Temperature: 98.0 F, Pulse: 72 bpm, Respiratory Rate: 16 breaths/min, Blood Pressure: 160/80 mmHg. Eyes conjunctiva clear no eyelid edema noted. pupils equal round and reactive to light and accommodation. Ears, Nose, Mouth, and Throat no gross abnormality of ear auricles or external auditory canals. normal hearing noted  during conversation. mucus membranes moist. Respiratory normal breathing without difficulty. Musculoskeletal Patient unable to walk without assistance. Psychiatric this patient is able to make decisions and demonstrates good insight into disease process. Alert and Oriented x 3. pleasant and cooperative. General Notes: Upon inspection patient did not have any open wound in the gluteal/sacral region at all. I discussed that with him today. Nonetheless he does have an issue with pressure getting to this region although I think this is more of a stage I issue nothing actually open which is both good news and bad news. The biggest issue is simply that he is unable to get up from a seated position very easily this requires quite a tremendous amount of work which means can be difficult for him to get up and down easily. Other Condition(s) Patient presents with Other Dermatologic Condition located on the Bilateral Gluteus. General Notes: slightly red with dry skin Assessment Active Problems ICD-10 Pressure ulcer of sacral region, stage 1 Muscle weakness (generalized) Plan Discharge From [facility] Services: Consult Only - No open wound, AandD Ointment to dry areas on buttocks. 1. Based on what I am seeing I do not see any signs of an open wound at this point which is great news. With that being said I do believe that the patient is doing well in that regard at least. Nonetheless there is definitely some pressure injury here that has me a little bit concerned. Obviously we do not want anything to open up I think he would benefit from an AandD ointment placed to the area and region. 2. I am good recommend as well that the patient should continue to try to keep pressure off of the gluteal/sacral region in general. I recommended that he should try to reposition frequently to prevent further breakdown. 3. I am also can recommend that the patient should continue to monitor for any signs of infection or  worsening or even an open wound if any of this occurs he should contact the office let me know soon as possible. We will see patient back for reevaluation in 1 week here in the clinic. If anything worsens or changes patient will contact our office for additional recommendations. Electronic Signature(s) Signed: 07/07/2022 4:21:29 PM By: Justin Derry PA-C Entered By: Justin Griffith on 07/07/2022 16:21:29 ROS/PFSH Details -------------------------------------------------------------------------------- Justin Griffith ([id number]) [id number] Page 5 of 6 Patient Name: Date of Service: Justin Griffith GER F. 07/07/2022 2:45 PM Medical Record Number: [id number] Patient Account Number: [id number] Date of Birth/Sex: Treating RN: 01/06/1941 (  82 y.o. Justin Griffith Primary Care Provider: Olevia Griffith Other Clinician: Referring Provider: Treating Provider/Extender: Justin Griffith in Treatment: 0 Constitutional Symptoms (General Health) Complaints and Symptoms: Positive for: Fatigue Negative for: Fever; Chills; Marked Weight Change Eyes Complaints and Symptoms: Negative for: Dry Eyes; Vision Changes; Glasses / Contacts Ear/Nose/Mouth/Throat Complaints and Symptoms: Negative for: Difficult clearing ears; Sinusitis Hematologic/Lymphatic Complaints and Symptoms: Negative for: Bleeding / Clotting Disorders; Human Immunodeficiency Virus Gastrointestinal Complaints and Symptoms: Negative for: Frequent diarrhea; Nausea; Vomiting Genitourinary Complaints and Symptoms: Negative for: Kidney failure/ Dialysis; Incontinence/dribbling Immunological Complaints and Symptoms: Negative for: Hives; Itching Integumentary (Skin) Complaints and Symptoms: Positive for: Wounds Respiratory Medical History: Positive for: Chronic Obstructive Pulmonary Disease (COPD) Cardiovascular Medical History: Positive for: Hypertension Endocrine Medical History: Positive for:  Type II Diabetes Time with diabetes: 5 Treated with: Oral agents Blood sugar tested every day: No Musculoskeletal Medical History: Positive for: Osteoarthritis Neurologic Medical History: Positive for: Neuropathy - legs, feet Oncologic Justin Griffith, Justin Griffith ([id number]) [id number] Page 6 of 6 Complaints and Symptoms: Review of System Notes: colon cancer Medical History: Positive for: Received Chemotherapy Immunizations Pneumococcal Vaccine: Received Pneumococcal Vaccination: No Implantable Devices No devices added Family and Social History Former smoker - ended on 08/22/1990; Marital Status - Married; Alcohol Use: Never; Drug Use: No History; Caffeine Use: Daily Electronic Signature(s) Signed: 07/07/2022 4:53:35 PM By: Justin Derry PA-C Signed: 07/08/2022 9:13:35 AM By: Justin Griffith, BSN, RN, CWS, Kim RN, BSN Entered By: Justin Griffith, BSN, RN, CWS, Justin Griffith on 07/07/2022 14:39:15 -------------------------------------------------------------------------------- SuperBill Details Patient Name: Date of Service: Justin Griffith, Justin Griffith GER F. 07/07/2022 Medical Record Number: [id number] Patient Account Number: [id number] Date of Birth/Sex: Treating RN: 07/14/1940 (81 y.o. Justin Griffith Primary Care Provider: Olevia Griffith Other Clinician: Referring Provider: Treating Provider/Extender: Justin Griffith in Treatment: 0 Diagnosis Coding ICD-10 Codes Code Description L89.151 Pressure ulcer of sacral region, stage 1 M62.81 Muscle weakness (generalized) Facility Procedures : 7 CPT4 Code: [id number] Description: 99213 - WOUND CARE VISIT-LEV 3 EST PT Modifier: Quantity: 1 Physician Procedures : CPT4 Code Description Modifier [id number] WC PHYS LEVEL 3 NEW PT ICD-10 Diagnosis Description L89.151 Pressure ulcer of sacral region, stage 1 M62.81 Muscle weakness (generalized) Quantity: 1 Electronic Signature(s) Signed: 07/07/2022 4:21:56 PM By: Justin Derry PA-C Entered By: Justin Griffith  on 07/07/2022 16:21:56

## 2022-07-09 ENCOUNTER — Encounter: Payer: Self-pay | Admitting: Family Medicine

## 2022-07-09 ENCOUNTER — Ambulatory Visit (INDEPENDENT_AMBULATORY_CARE_PROVIDER_SITE_OTHER): Payer: PPO | Admitting: Family Medicine

## 2022-07-09 VITALS — BP 156/70 | HR 65 | Temp 98.6°F | Wt 246.6 lb

## 2022-07-09 DIAGNOSIS — I129 Hypertensive chronic kidney disease with stage 1 through stage 4 chronic kidney disease, or unspecified chronic kidney disease: Secondary | ICD-10-CM | POA: Diagnosis not present

## 2022-07-09 DIAGNOSIS — R8281 Pyuria: Secondary | ICD-10-CM | POA: Diagnosis not present

## 2022-07-09 DIAGNOSIS — R350 Frequency of micturition: Secondary | ICD-10-CM

## 2022-07-09 MED ORDER — CIPROFLOXACIN HCL 500 MG PO TABS: 500.0000 mg | ORAL_TABLET | Freq: Two times a day (BID) | ORAL | 0 refills | Status: DC

## 2022-07-09 NOTE — Progress Notes (Signed)
BP (!) 156/70   Pulse 65   Temp 98.6 F (37 C)   Wt 246 lb 9.6 oz (111.9 kg)   SpO2 97%   BMI 35.38 kg/m    Subjective:    Patient ID: Justin Griffith, male    DOB: 09-05-1940, 82 y.o.   MRN: [id number]  HPI: Justin Griffith is a 82 y.o. male  Chief Complaint  Patient presents with   Hypertension   Urinary Frequency   Saw wound care. No open wound at this time. Does not need to follow up with them.   HYPERTENSION  Hypertension status: better  Satisfied with current treatment? yes Duration of hypertension: chronic BP monitoring frequency:  not checking BP medication side effects:  no Medication compliance: good compliance Previous BP meds:losartan, torsemide, hydralazine, clonidine Aspirin: no Recurrent headaches: no Visual changes: no Palpitations: no Dyspnea: no Chest pain: no Lower extremity edema: no Dizzy/lightheaded: no  URINARY SYMPTOMS- needing to go pee 1x an hour Duration: chronic Dysuria: no Urinary frequency: yes Urgency: no Small volume voids: no Symptom severity: severe Urinary incontinence: yes Foul odor: no Hematuria: no Abdominal pain: no Back pain: no Suprapubic pain/pressure: no Flank pain: no Fever:  no Vomiting: no Status: better Previous urinary tract infection: yes Recurrent urinary tract infection: no Penile discharge: no Treatments attempted: antibiotics and increasing fluids   Relevant past medical, surgical, family and social history reviewed and updated as indicated. Interim medical history since our last visit reviewed. Allergies and medications reviewed and updated.  Review of Systems  Constitutional: Negative.   Respiratory: Negative.    Cardiovascular: Negative.   Gastrointestinal: Negative.   Genitourinary:  Positive for frequency. Negative for decreased urine volume, difficulty urinating, dysuria, enuresis, flank pain, genital sores, hematuria, penile discharge, penile pain, penile swelling, scrotal swelling,  testicular pain and urgency.  Musculoskeletal: Negative.   Neurological: Negative.   Psychiatric/Behavioral: Negative.      Per HPI unless specifically indicated above     Objective:    BP (!) 156/70   Pulse 65   Temp 98.6 F (37 C)   Wt 246 lb 9.6 oz (111.9 kg)   SpO2 97%   BMI 35.38 kg/m   Wt Readings from Last 3 Encounters:  07/09/22 246 lb 9.6 oz (111.9 kg)  06/20/22 247 lb 12.8 oz (112.4 kg)  06/10/22 249 lb 4.8 oz (113.1 kg)    Physical Exam Vitals and nursing note reviewed.  Constitutional:      General: He is not in acute distress.    Appearance: Normal appearance. He is obese. He is not ill-appearing, toxic-appearing or diaphoretic.  HENT:     Head: Normocephalic and atraumatic.     Right Ear: External ear normal.     Left Ear: External ear normal.     Nose: Nose normal.     Mouth/Throat:     Mouth: Mucous membranes are moist.     Pharynx: Oropharynx is clear.  Eyes:     General: No scleral icterus.       Right eye: No discharge.        Left eye: No discharge.     Extraocular Movements: Extraocular movements intact.     Conjunctiva/sclera: Conjunctivae normal.     Pupils: Pupils are equal, round, and reactive to light.  Cardiovascular:     Rate and Rhythm: Normal rate and regular rhythm.     Pulses: Normal pulses.     Heart sounds: Normal heart sounds. No murmur heard.  No friction rub. No gallop.  Pulmonary:     Effort: Pulmonary effort is normal. No respiratory distress.     Breath sounds: Normal breath sounds. No stridor. No wheezing, rhonchi or rales.  Chest:     Chest wall: No tenderness.  Musculoskeletal:        General: Normal range of motion.     Cervical back: Normal range of motion and neck supple.  Skin:    General: Skin is warm and dry.     Capillary Refill: Capillary refill takes less than 2 seconds.     Coloration: Skin is not jaundiced or pale.     Findings: No bruising, erythema, lesion or rash.  Neurological:     General: No  focal deficit present.     Mental Status: He is alert and oriented to person, place, and time. Mental status is at baseline.  Psychiatric:        Mood and Affect: Mood normal.        Behavior: Behavior normal.        Thought Content: Thought content normal.        Judgment: Judgment normal.     Results for orders placed or performed in visit on 06/30/22  Retic Panel  Result Value Ref Range   Retic Ct Pct 1.5 0.4 - 3.1 %   RBC. 3.41 (L) 4.22 - 5.81 MIL/uL   Retic Count, Absolute 50.5 19.0 - 186.0 K/uL   Immature Retic Fract 10.2 2.3 - 15.9 %   Reticulocyte Hemoglobin 27.9 (L) >27.9 pg  Ferritin  Result Value Ref Range   Ferritin 13 (L) 24 - 336 ng/mL  Iron and TIBC  Result Value Ref Range   Iron 28 (L) 45 - 182 ug/dL   TIBC 413 244 - 010 ug/dL   Saturation Ratios 7 (L) 17.9 - 39.5 %   UIBC 377 ug/dL  CBC with Differential (Cancer Center Only)  Result Value Ref Range   WBC Count 7.5 4.0 - 10.5 K/uL   RBC 3.46 (L) 4.22 - 5.81 MIL/uL   Hemoglobin 9.0 (L) 13.0 - 17.0 g/dL   HCT 27.2 (L) 53.6 - 64.4 %   MCV 83.5 80.0 - 100.0 fL   MCH 26.0 26.0 - 34.0 pg   MCHC 31.1 30.0 - 36.0 g/dL   RDW 03.4 74.2 - 59.5 %   Platelet Count 288 150 - 400 K/uL   nRBC 0.0 0.0 - 0.2 %   Neutrophils Relative % 61 %   Neutro Abs 4.6 1.7 - 7.7 K/uL   Lymphocytes Relative 27 %   Lymphs Abs 2.0 0.7 - 4.0 K/uL   Monocytes Relative 7 %   Monocytes Absolute 0.5 0.1 - 1.0 K/uL   Eosinophils Relative 4 %   Eosinophils Absolute 0.3 0.0 - 0.5 K/uL   Basophils Relative 1 %   Basophils Absolute 0.0 0.0 - 0.1 K/uL   Immature Granulocytes 0 %   Abs Immature Granulocytes 0.03 0.00 - 0.07 K/uL  CMP (Cancer Center only)  Result Value Ref Range   Sodium 131 (L) 135 - 145 mmol/L   Potassium 3.9 3.5 - 5.1 mmol/L   Chloride 99 98 - 111 mmol/L   CO2 23 22 - 32 mmol/L   Glucose, Bld 139 (H) 70 - 99 mg/dL   BUN 36 (H) 8 - 23 mg/dL   Creatinine 6.38 (H) 7.56 - 1.24 mg/dL   Calcium 9.5 8.9 - 43.3 mg/dL   Total  Protein 7.3 6.5 - 8.1  g/dL   Albumin 3.3 (L) 3.5 - 5.0 g/dL   AST 18 15 - 41 U/L   ALT 12 0 - 44 U/L   Alkaline Phosphatase 66 38 - 126 U/L   Total Bilirubin 0.5 0.3 - 1.2 mg/dL   GFR, Estimated 38 (L) >60 mL/min   Anion gap 9 5 - 15      Assessment & Plan:   Problem List Items Addressed This Visit       Genitourinary   Benign hypertensive renal disease - Primary    BP improved. Continue current regimen. Continue to monitor. Call with any concerns.       Other Visit Diagnoses     Urinary frequency       Likely multi-factorial. + leuks- will treat with cipro empirically while awaiting culture. Seeing urology next week. Continue to monitor.   Relevant Orders   Urinalysis, Routine w reflex microscopic   Urine Culture   Pyuria       + leuks- will treat with cipro empirically while awaiting culture. Seeing urology next week. Continue to monitor.   Relevant Orders   Urine Culture        Follow up plan: Return in about 1 week (around 07/16/2022).

## 2022-07-09 NOTE — Assessment & Plan Note (Signed)
BP improved. Continue current regimen. Continue to monitor. Call with any concerns.

## 2022-07-10 LAB — URINALYSIS, ROUTINE W REFLEX MICROSCOPIC
Bilirubin, UA: NEGATIVE
Glucose, UA: NEGATIVE
Ketones, UA: NEGATIVE
Nitrite, UA: NEGATIVE
RBC, UA: NEGATIVE
Specific Gravity, UA: 1.015 (ref 1.005–1.030)
Urobilinogen, Ur: 0.2 mg/dL (ref 0.2–1.0)
pH, UA: 5.5 (ref 5.0–7.5)

## 2022-07-10 LAB — MICROSCOPIC EXAMINATION
Bacteria, UA: NONE SEEN
RBC, Urine: NONE SEEN /hpf (ref 0–2)

## 2022-07-11 LAB — URINE CULTURE

## 2022-07-13 ENCOUNTER — Telehealth: Payer: Self-pay | Admitting: Family Medicine

## 2022-07-13 ENCOUNTER — Telehealth: Payer: PPO

## 2022-07-13 DIAGNOSIS — I5030 Unspecified diastolic (congestive) heart failure: Secondary | ICD-10-CM | POA: Diagnosis not present

## 2022-07-13 DIAGNOSIS — L89151 Pressure ulcer of sacral region, stage 1: Secondary | ICD-10-CM | POA: Diagnosis not present

## 2022-07-13 DIAGNOSIS — N1832 Chronic kidney disease, stage 3b: Secondary | ICD-10-CM | POA: Diagnosis not present

## 2022-07-13 DIAGNOSIS — Z515 Encounter for palliative care: Secondary | ICD-10-CM | POA: Diagnosis not present

## 2022-07-13 DIAGNOSIS — I110 Hypertensive heart disease with heart failure: Secondary | ICD-10-CM | POA: Diagnosis not present

## 2022-07-13 DIAGNOSIS — D692 Other nonthrombocytopenic purpura: Secondary | ICD-10-CM | POA: Diagnosis not present

## 2022-07-13 DIAGNOSIS — Z6834 Body mass index (BMI) 34.0-34.9, adult: Secondary | ICD-10-CM | POA: Diagnosis not present

## 2022-07-13 DIAGNOSIS — J449 Chronic obstructive pulmonary disease, unspecified: Secondary | ICD-10-CM | POA: Diagnosis not present

## 2022-07-13 DIAGNOSIS — F03A0 Unspecified dementia, mild, without behavioral disturbance, psychotic disturbance, mood disturbance, and anxiety: Secondary | ICD-10-CM | POA: Diagnosis not present

## 2022-07-13 NOTE — Telephone Encounter (Signed)
Called to relay information to patient's social worker Benita Dowdell. Per Maye Hides, a referral team specialist, as of 06/12/22, patient cannot be seen with [facility] until he is discharged from [facility]. Patient was advised that he would have to let them know if he wants to be seen by [facility].   OK for [facility] to give note if Benita calls back.

## 2022-07-13 NOTE — Telephone Encounter (Signed)
This should have already been done? Can we please check on this?

## 2022-07-13 NOTE — Telephone Encounter (Signed)
'  Justin Griffith', SW calling back.  States she spoke with [facility] . they report order was sent to [facility].  . States initially [facility] could not see pt as they were short staffed "May be why order was sent elsewhere." States pt has not had any home care. Pt prefers [facility]. Questioning what should be done moving forward.  Please advise: Justin Griffith # [phone]

## 2022-07-13 NOTE — Telephone Encounter (Unsigned)
Copied from CRM [id number]. Topic: Referral - Status >> Jul 13, 2022 11:37 AM Macon Large wrote: Reason for CRM: Amparo Bristol with [facility] (Social worker) requests that a referral be sent to [facility] for physical therapy and occupational therapy. Cb# [phone] and contact patient to advise

## 2022-07-14 ENCOUNTER — Encounter: Payer: Self-pay | Admitting: Oncology

## 2022-07-14 NOTE — Telephone Encounter (Signed)
Requested most recent office notes have been faxed to contact information provided to ATTN: Angelique Blonder

## 2022-07-15 ENCOUNTER — Telehealth: Payer: PPO

## 2022-07-15 ENCOUNTER — Ambulatory Visit (INDEPENDENT_AMBULATORY_CARE_PROVIDER_SITE_OTHER): Payer: PPO

## 2022-07-15 ENCOUNTER — Ambulatory Visit (INDEPENDENT_AMBULATORY_CARE_PROVIDER_SITE_OTHER): Payer: PPO | Admitting: Urology

## 2022-07-15 VITALS — BP 202/99 | HR 81

## 2022-07-15 DIAGNOSIS — R350 Frequency of micturition: Secondary | ICD-10-CM

## 2022-07-15 DIAGNOSIS — Z87898 Personal history of other specified conditions: Secondary | ICD-10-CM

## 2022-07-15 DIAGNOSIS — R300 Dysuria: Secondary | ICD-10-CM

## 2022-07-15 DIAGNOSIS — R339 Retention of urine, unspecified: Secondary | ICD-10-CM | POA: Diagnosis not present

## 2022-07-15 DIAGNOSIS — J449 Chronic obstructive pulmonary disease, unspecified: Secondary | ICD-10-CM

## 2022-07-15 DIAGNOSIS — R399 Unspecified symptoms and signs involving the genitourinary system: Secondary | ICD-10-CM

## 2022-07-15 DIAGNOSIS — E1165 Type 2 diabetes mellitus with hyperglycemia: Secondary | ICD-10-CM

## 2022-07-15 DIAGNOSIS — R296 Repeated falls: Secondary | ICD-10-CM

## 2022-07-15 DIAGNOSIS — I5032 Chronic diastolic (congestive) heart failure: Secondary | ICD-10-CM

## 2022-07-15 LAB — BLADDER SCAN AMB NON-IMAGING

## 2022-07-15 NOTE — Chronic Care Management (AMB) (Signed)
Chronic Care Management   CCM RN Visit Note  07/15/2022 Name: Justin Griffith MRN: [id number] DOB: Mar 07, 1941  Subjective: Justin Griffith is a 82 y.o. year old male who is a primary care patient of Dorcas Carrow, DO. The patient was referred to the Chronic Care Management team for assistance with care management needs subsequent to provider initiation of CCM services and plan of care.    Today's Visit:  Engaged with patient by telephone for follow up visit.        Goals Addressed             This Visit's Progress    CCM Expected Outcome:  Monitor, Self-Manage and Reduce Symptoms of  COPD         Current Barriers:  Knowledge Deficits related to factors that may cause the patient to have an exacerbation of COPD Care Coordination needs related to cost constraints  in a patient with COPD and needing coverage help with [employer] Chronic Disease Management support and education needs related to effective management of COPD 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   Planned Interventions: Provided patient with basic written and verbal COPD education on self care/management/and exacerbation prevention. The patient denies any issues with his breathing. The patient states his COPD is stable. The patient is having elevations in his blood pressures. Sees the pcp regularly. Adjustment of medications Advised patient to track and manage COPD triggers. Review of factors that cause exacerbation. The patient states his breathing is stable right now Provided written and verbal instructions on pursed lip breathing and utilized returned demonstration as teach back Provided instruction about proper use of medications used for management of COPD including inhalers. States compliance with medications.  Advised patient to self assesses COPD action plan zone and make appointment with provider if in the yellow zone for 48  hours without improvement Provided education about and advised patient to utilize infection prevention strategies to reduce risk of respiratory infection Discussed the importance of adequate rest and management of fatigue with COPD Screening for signs and symptoms of depression related to chronic disease state  Assessed social determinant of health barriers Pharm D referral for assistance with [place] cost constraints. The patient is working with the pharm D.   Symptom Management: Take medications as prescribed   Attend all scheduled provider appointments Call provider office for new concerns or questions  call the Suicide and Crisis Lifeline: 988 call the [place] National Suicide Prevention Lifeline: 905-337-7336 or TTY: 838-004-0319 TTY 209-402-6081) to talk to a trained counselor call 1-800-273-TALK (toll free, 24 hour hotline) if experiencing a Mental Health or Behavioral Health Crisis  avoid second hand smoke eliminate smoking in my home identify and remove indoor air pollutants limit outdoor activity during cold weather listen for public air quality announcements every day develop a rescue plan eliminate symptom triggers at home follow rescue plan if symptoms flare-up  Follow Up Plan: Telephone follow up appointment with care management team member scheduled for: 08-21-2022 at 1145 am       CCM Expected Outcome:  Monitor, Self-Manage and Reduce Symptoms of Diabetes       Current Barriers:  Knowledge Deficits related to the importance of good blood sugar control in a patient with DM Care Coordination needs related to ongoing education and support for management of medications and disease management  in a patient with DM Chronic Disease Management support and education needs related to  effective management of DM Lacks caregiver support.   Lab Results  Component Value Date   HGBA1C 11.5 (H) 06/02/2022     Planned Interventions: Provided education to patient about basic DM  disease process. DM is out of control again. The patient states he is taking his medications and watching what he eats. The patient sometimes does forget to take his medications. Review and education provided. ; Reviewed medications with patient and discussed importance of medication.  adherence. The patient is now using pill packs and he says this is working out well for him. Encouraged him to take his medications as prescribed and call for any questions or concerns. Denies any issues with his DM at this time. Will continue to monitor. DM is stable. Reviewed prescribed diet with patient heart healthy/ADA diet. The patient states compliance with heart healthy/Ada diet. Education given on monitoring for hidden sodium; Counseled on importance of regular laboratory monitoring as prescribed. Has regular labwork        Discussed plans with patient for ongoing care management follow up and provided patient with direct contact information for care management team;      Provided patient with written educational materials related to hypo and hyperglycemia and importance of correct treatment;       Reviewed scheduled/upcoming provider appointments including: 07-21-2022 at 220 pm, reminder given today;         call provider for findings outside established parameters;       Review of patient status, including review of consultants reports, relevant laboratory and other test results, and medications completed;       Advised patient to discuss changes in his DM and questions or concerns with provider;      Screening for signs and symptoms of depression related to chronic disease state;        Assessed social determinant of health barriers;         Symptom Management: Take medications as prescribed   Attend all scheduled provider appointments Call provider office for new concerns or questions  call the Suicide and Crisis Lifeline: 988 call the [place] National Suicide Prevention Lifeline: 9031310581 or TTY:  650-315-6587 TTY 540-196-2471) to talk to a trained counselor call 1-800-273-TALK (toll free, 24 hour hotline) if experiencing a Mental Health or Behavioral Health Crisis  check feet daily for cuts, sores or redness trim toenails straight across manage portion size wash and dry feet carefully every day wear comfortable, cotton socks wear comfortable, well-fitting shoes  Follow Up Plan: Telephone follow up appointment with care management team member scheduled for: 08-21-2022 at 1145 am       CCM Expected Outcome:  Monitor, Self-Manage and Reduce Symptoms of Heart Failure       Current Barriers:  Knowledge Deficits related to the importance of monitoring for changes in water weight and effective management of HF Chronic Disease Management support and education needs related to effective management of HF Lacks caregiver support.  Wt Readings from Last 3 Encounters:  07/09/22 246 lb 9.6 oz (111.9 kg)  06/20/22 247 lb 12.8 oz (112.4 kg)  06/10/22 249 lb 4.8 oz (113.1 kg)    BP Readings from Last 3 Encounters:  07/15/22 (!) 202/99  07/09/22 (!) 156/70  07/02/22 (!) 175/77     Planned Interventions: Basic overview and discussion of pathophysiology of Heart Failure reviewed. The patients weight is stable. The patient has been having elevated blood pressures. States today the reason he had elevated blood pressures is because he forgot to take  his medications until about 10 minutes before his appointment, but he took.  The patient has had adjustment in medications. Denies headaches today. States he does not know what his blood pressures at home are. Encouraged the patient to take his blood pressures at home and record. Education and support given.  Provided education on low sodium diet. The patient states he does not add salt to his foods. He uses a product called "no salt". The patient states that he is good about not eating foods high in sodium.  Reminder provided on making sure he follows a  heart healthy/ADA diet. Review and education provided Reviewed Heart Failure Action Plan in depth and provided written copy Assessed need for readable accurate scales in home. Discussed safety in weighing. The patient has a hight fall risk. Uses a walker Provided education about placing scale on hard, flat surface Advised patient to weigh each morning after emptying bladder. The patient no longer has an indwelling catheter but he is having to go to the bathroom frequently. The patient is thankful not to have the catheter. He says he will not do that again as it was not pleasant. Education on being safe and preventing falls.  Discussed importance of daily weight and advised patient to weigh and record daily. The patient is having fluctuations of weights at this time.  Reviewed role of diuretics in prevention of fluid overload and management of heart failure. The patient states he is taking his medications as prescribed  Discussed the importance of keeping all appointments with provider. The patient has a follow up with the pcp on 07-21-2022 at 220 pm, reminder provided today.   Provided patient with education about the role of exercise in the management of heart failure Advised patient to discuss changes in his heart failure or heart health with provider Screening for signs and symptoms of depression related to chronic disease state  Assessed social determinant of health barriers  Symptom Management: Take medications as prescribed   Attend all scheduled provider appointments Call provider office for new concerns or questions  call the Suicide and Crisis Lifeline: 988 call the [place] National Suicide Prevention Lifeline: 445-138-4806 or TTY: (419)751-7322 TTY 346-472-4421) to talk to a trained counselor call 1-800-273-TALK (toll free, 24 hour hotline) if experiencing a Mental Health or Behavioral Health Crisis  call office if I gain more than 2 pounds in one day or 5 pounds in one week keep legs up  while sitting use salt in moderation watch for swelling in feet, ankles and legs every day weigh myself daily develop a rescue plan follow rescue plan if symptoms flare-up track symptoms and what helps feel better or worse dress right for the weather, hot or cold  Follow Up Plan: Telephone follow up appointment with care management team member scheduled for: 08-21-2022 at 1145 am       CCM Expected Outcome:  Monitor, Self-Manage and Reduce Symptoms of: Falls and safety concerns       Current Barriers:  Knowledge Deficits related to safety concerns and falls prevention in patient with multiple chronic conditions and history of falls with injury Care Coordination needs related to resources in the community and ongoing support in management of chronic conditions and fall prevention in a patient with history of falls with most recent being February 2024 Chronic Disease Management support and education needs related to effective management of falls prevention and safety concerns in a patient with multiple chronic conditions Lacks caregiver support.   Planned Interventions: Provided  verbal education re: potential causes of falls and Fall prevention strategies Reviewed medications and discussed potential side effects of medications such as dizziness and frequent urination. The patient states he cannot remember why he fell recently. He states that he is taking his medications. Patient no longer has the foley catheter but goes to the bathroom frequently. Review of monitoring for safety concerns and being careful  Advised patient of importance of notifying provider of falls. The patient states he had a fall "3 weeks" ago and he does not remember why he fell. He sees the pcp on 05-14-2022 for follow up. Education on reporting new falls.  Assessed for signs and symptoms of orthostatic hypotension. The patient is having elevation in blood pressures. Denies any headaches.  Assessed for falls since last  encounter. Last fall occurred "3 weeks ago" per the patient. Education and fall prevention reviewed. Last fall reported was in February 2024 Assessed patients knowledge of fall risk prevention secondary to previously provided education Provided patient information for fall alert systems Assessed working status of life alert bracelet and patient adherence Advised patient to discuss changes in mobility and new falls with provider. Patient will have a follow up with the pcp on 07-21-2022. The patient wants [facility] to come back in his home and work with him. He states that was helpful. Will let Dr. Laural Benes know of the patients request.  Review of discussing changes in his condition with the pcp. The patient has a history of high fall risk. Education provided.  Screening for signs and symptoms of depression related to chronic disease state Assessed social determinant of health barriers  Symptom Management: Take medications as prescribed   Attend all scheduled provider appointments Call provider office for new concerns or questions  call the Suicide and Crisis Lifeline: 988 call the [place] National Suicide Prevention Lifeline: 872 372 5349 or TTY: 515-806-2001 TTY (712)167-3945) to talk to a trained counselor call 1-800-273-TALK (toll free, 24 hour hotline) if experiencing a Mental Health or Behavioral Health Crisis   Follow Up Plan: Telephone follow up appointment with care management team member scheduled for: 08-21-2022 at 1145 am          Plan:Telephone follow up appointment with care management team member scheduled for:  08-21-2022 at 1145 am  Alto Denver RN, MSN, CCM RN Care Manager  Chronic Care Management Direct Number: [phone]

## 2022-07-15 NOTE — Patient Instructions (Signed)
Please call the care guide team at [phone] if you need to cancel or reschedule your appointment.   If you are experiencing a Mental Health or [place] or need someone to talk to, please call the Suicide and Crisis Lifeline: 988 call the [place] National Suicide Prevention Lifeline: 850-728-9425 or TTY: 878-815-7245 TTY 5636614875) to talk to a trained counselor call 1-800-273-TALK (toll free, 24 hour hotline)   Following is a copy of the CCM Program Consent:  CCM service includes personalized support from designated clinical staff supervised by the physician, including individualized plan of care and coordination with other care providers 24/7 contact phone numbers for assistance for urgent and routine care needs. Service will only be billed when office clinical staff spend 20 minutes or more in a month to coordinate care. Only one practitioner may furnish and bill the service in a calendar month. The patient may stop CCM services at amy time (effective at the end of the month) by phone call to the office staff. The patient will be responsible for cost sharing (co-pay) or up to 20% of the service fee (after annual deductible is met)  Following is a copy of your full provider care plan:   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   Planned Interventions: Provided patient with basic written and verbal COPD education on self  care/management/and exacerbation prevention. The patient denies any issues with his breathing. The patient states his COPD is stable. The patient is having elevations in his blood pressures. Sees the pcp regularly. Adjustment of medications Advised patient to track and manage COPD triggers. Review of factors that cause exacerbation. The patient states his breathing is stable right now Provided written and verbal instructions on pursed lip breathing and utilized returned demonstration as teach back Provided instruction about proper use of medications used for management of COPD including inhalers. States compliance with medications.  Advised patient to self assesses COPD action plan zone and make appointment with provider if in the yellow zone for 48 hours without improvement Provided education about and advised patient to utilize infection prevention strategies to reduce risk of respiratory infection Discussed the importance of adequate rest and management of fatigue with COPD Screening for signs and symptoms of depression related to chronic disease state  Assessed social determinant of health barriers Pharm D referral for assistance with [place] cost constraints. The patient is working with the pharm D.   Symptom Management: Take medications as prescribed   Attend all scheduled provider appointments Call provider office for new concerns or questions  call the Suicide and Crisis Lifeline: 988 call the [place] National Suicide Prevention Lifeline: (641) 759-9355 or TTY: 970-318-3975 TTY 574 136 2721) to talk to a trained counselor call 1-800-273-TALK (toll free, 24 hour hotline) if experiencing a Mental Health or [place]  avoid second hand smoke eliminate smoking in my home identify and remove indoor air pollutants limit outdoor activity  during cold weather listen for public air quality announcements every day develop a rescue plan eliminate symptom triggers at home follow  rescue plan if symptoms flare-up  Follow Up Plan: Telephone follow up appointment with care management team member scheduled for: 08-21-2022 at 1145 am       CCM Expected Outcome:  Monitor, Self-Manage and Reduce Symptoms of Diabetes       Current Barriers:  Knowledge Deficits related to the importance of good blood sugar control in a patient with DM Care Coordination needs related to ongoing education and support for management of medications and disease management  in a patient with DM Chronic Disease Management support and education needs related to effective management of DM Lacks caregiver support.   Lab Results  Component Value Date   HGBA1C 11.5 (H) 06/02/2022     Planned Interventions: Provided education to patient about basic DM disease process. DM is out of control again. The patient states he is taking his medications and watching what he eats. The patient sometimes does forget to take his medications. Review and education provided. ; Reviewed medications with patient and discussed importance of medication.  adherence. The patient is now using pill packs and he says this is working out well for him. Encouraged him to take his medications as prescribed and call for any questions or concerns. Denies any issues with his DM at this time. Will continue to monitor. DM is stable. Reviewed prescribed diet with patient heart healthy/ADA diet. The patient states compliance with heart healthy/Ada diet. Education given on monitoring for hidden sodium; Counseled on importance of regular laboratory monitoring as prescribed. Has regular labwork        Discussed plans with patient for ongoing care management follow up and provided patient with direct contact information for care management team;      Provided patient with written educational materials related to hypo and hyperglycemia and importance of correct treatment;       Reviewed scheduled/upcoming provider appointments including: 07-21-2022  at 220 pm, reminder given today;         call provider for findings outside established parameters;       Review of patient status, including review of consultants reports, relevant laboratory and other test results, and medications completed;       Advised patient to discuss changes in his DM and questions or concerns with provider;      Screening for signs and symptoms of depression related to chronic disease state;        Assessed social determinant of health barriers;         Symptom Management: Take medications as prescribed   Attend all scheduled provider appointments Call provider office for new concerns or questions  call the Suicide and Crisis Lifeline: 988 call the [place] National Suicide Prevention Lifeline: 864 880 1793 or TTY: 856 758 2808 TTY (928) 660-1682) to talk to a trained counselor call 1-800-273-TALK (toll free, 24 hour hotline) if experiencing a Mental Health or Behavioral Health Crisis  check feet daily for cuts, sores or redness trim toenails straight across manage portion size wash and dry feet carefully every day wear comfortable, cotton socks wear comfortable, well-fitting shoes  Follow Up Plan: Telephone follow up appointment with care management team member scheduled for: 08-21-2022 at 1145 am       CCM Expected Outcome:  Monitor, Self-Manage and Reduce Symptoms of Heart Failure       Current Barriers:  Knowledge Deficits related to the importance of monitoring for changes in water  weight and effective management of HF Chronic Disease Management support and education needs related to effective management of HF Lacks caregiver support.  Wt Readings from Last 3 Encounters:  07/09/22 246 lb 9.6 oz (111.9 kg)  06/20/22 247 lb 12.8 oz (112.4 kg)  06/10/22 249 lb 4.8 oz (113.1 kg)    BP Readings from Last 3 Encounters:  07/15/22 (!) 202/99  07/09/22 (!) 156/70  07/02/22 (!) 175/77     Planned Interventions: Basic overview and discussion of  pathophysiology of Heart Failure reviewed. The patients weight is stable. The patient has been having elevated blood pressures. States today the reason he had elevated blood pressures is because he forgot to take his medications until about 10 minutes before his appointment, but he took.  The patient has had adjustment in medications. Denies headaches today. States he does not know what his blood pressures at home are. Encouraged the patient to take his blood pressures at home and record. Education and support given.  Provided education on low sodium diet. The patient states he does not add salt to his foods. He uses a product called "no salt". The patient states that he is good about not eating foods high in sodium.  Reminder provided on making sure he follows a heart healthy/ADA diet. Review and education provided Reviewed Heart Failure Action Plan in depth and provided written copy Assessed need for readable accurate scales in home. Discussed safety in weighing. The patient has a hight fall risk. Uses a walker Provided education about placing scale on hard, flat surface Advised patient to weigh each morning after emptying bladder. The patient no longer has an indwelling catheter but he is having to go to the bathroom frequently. The patient is thankful not to have the catheter. He says he will not do that again as it was not pleasant. Education on being safe and preventing falls.  Discussed importance of daily weight and advised patient to weigh and record daily. The patient is having fluctuations of weights at this time.  Reviewed role of diuretics in prevention of fluid overload and management of heart failure. The patient states he is taking his medications as prescribed  Discussed the importance of keeping all appointments with provider. The patient has a follow up with the pcp on 07-21-2022 at 220 pm, reminder provided today.   Provided patient with education about the role of exercise in the  management of heart failure Advised patient to discuss changes in his heart failure or heart health with provider Screening for signs and symptoms of depression related to chronic disease state  Assessed social determinant of health barriers  Symptom Management: Take medications as prescribed   Attend all scheduled provider appointments Call provider office for new concerns or questions  call the Suicide and Crisis Lifeline: 988 call the [place] National Suicide Prevention Lifeline: 929-517-5839 or TTY: 805 633 8021 TTY (986)396-3962) to talk to a trained counselor call 1-800-273-TALK (toll free, 24 hour hotline) if experiencing a Mental Health or Behavioral Health Crisis  call office if I gain more than 2 pounds in one day or 5 pounds in one week keep legs up while sitting use salt in moderation watch for swelling in feet, ankles and legs every day weigh myself daily develop a rescue plan follow rescue plan if symptoms flare-up track symptoms and what helps feel better or worse dress right for the weather, hot or cold  Follow Up Plan: Telephone follow up appointment with care management team member scheduled for: 08-21-2022 at  1145 am       CCM Expected Outcome:  Monitor, Self-Manage and Reduce Symptoms of: Falls and safety concerns       Current Barriers:  Knowledge Deficits related to safety concerns and falls prevention in patient with multiple chronic conditions and history of falls with injury Care Coordination needs related to resources in the community and ongoing support in management of chronic conditions and fall prevention in a patient with history of falls with most recent being February 2024 Chronic Disease Management support and education needs related to effective management of falls prevention and safety concerns in a patient with multiple chronic conditions Lacks caregiver support.   Planned Interventions: Provided  verbal education re: potential causes of falls and  Fall prevention strategies Reviewed medications and discussed potential side effects of medications such as dizziness and frequent urination. The patient states he cannot remember why he fell recently. He states that he is taking his medications. Patient no longer has the foley catheter but goes to the bathroom frequently. Review of monitoring for safety concerns and being careful  Advised patient of importance of notifying provider of falls. The patient states he had a fall "3 weeks" ago and he does not remember why he fell. He sees the pcp on 05-14-2022 for follow up. Education on reporting new falls.  Assessed for signs and symptoms of orthostatic hypotension. The patient is having elevation in blood pressures. Denies any headaches.  Assessed for falls since last encounter. Last fall occurred "3 weeks ago" per the patient. Education and fall prevention reviewed. Last fall reported was in February 2024 Assessed patients knowledge of fall risk prevention secondary to previously provided education Provided patient information for fall alert systems Assessed working status of life alert bracelet and patient adherence Advised patient to discuss changes in mobility and new falls with provider. Patient will have a follow up with the pcp on 07-21-2022. The patient wants [facility] to come back in his home and work with him. He states that was helpful. Will let Dr. Laural Benes know of the patients request.  Review of discussing changes in his condition with the pcp. The patient has a history of high fall risk. Education provided.  Screening for signs and symptoms of depression related to chronic disease state Assessed social determinant of health barriers  Symptom Management: Take medications as prescribed   Attend all scheduled provider appointments Call provider office for new concerns or questions  call the Suicide and Crisis Lifeline: 988 call the [place] National Suicide Prevention Lifeline: 818-579-6802 or  TTY: 443-718-3837 TTY 712 806 7250) to talk to a trained counselor call 1-800-273-TALK (toll free, 24 hour hotline) if experiencing a Mental Health or Behavioral Health Crisis   Follow Up Plan: Telephone follow up appointment with care management team member scheduled for: 08-21-2022 at 1145 am          The patient verbalized understanding of instructions, educational materials, and care plan provided today and DECLINED offer to receive copy of patient instructions, educational materials, and care plan.  Telephone follow up appointment with care management team member scheduled for: 08-21-2022 at 1145 am

## 2022-07-15 NOTE — Progress Notes (Signed)
   07/15/2022 10:03 AM   Sherald Hess 1941-03-01 [id number]  Reason for visit: Follow up BPH, urinary retention, history of gross hematuria and recurrent UTI  HPI: Extremely comorbid 82 year old male with BPH with recurrent gross hematuria, recurrent UTIs, and urinary retention managed long-term with chronic Foley catheter for 9 months in 2023.  Prostate measures ~200 g on CT.  He had originally opted for HOLEP to address his urinary retention and recurrent gross hematuria, however had a pulmonary embolus 1 week prior to scheduled surgery in June 2023 that required anticoagulation with Eliquis.  He was seen by our PA in July 2023 and was supposed to be scheduled with interventional radiology to consider PAE for both his urinary retention and recurrent gross hematuria, but he no showed multiple follow-up appointments from them.  He had numerous hospital visits for non-draining Foley and hematuria over the last year.  He ultimately underwent prostatic artery embolization with Dr. Elby Showers from interventional radiology on 05/07/2022.  He was hospitalized on 06/13/2018 for for nondraining Foley catheter/hematuria, and Foley catheter was ultimately removed that hospitalization and he was voiding spontaneously.  He was having severe dysuria and urinary frequency at that time that has since resolved.  Initial urine culture showed no growth, however urine culture on 06/25/2022 showed low growth of staph hemolyticus, Enterococcus, and Raoultella-he was started on 4-week course of doxycycline by Hilton Sinclair, PA at that time.  The Enterococcus was not sensitive to any antibiotics.  He does think the doxycycline resolved the dysuria.  He was recently seen by PCP on 07/09/2022 and urine was relatively benign and culture showed <10k mixed flora, and he was also started on Cipro at that time.  His primary complaint today is urinary frequency about every hour during the day.  This continues to improve, and was  initially every 15 to 30 minutes.  He is only getting up once or twice overnight. PVR today normal at 0ml.  This  He also takes torsemide which likely contributes to his urinary frequency, and drinks only tea during the day.  He has some urgency and occasional urge incontinence if he does not make it to the bathroom in time.  We reviewed behavioral strategies, as well as discussed reasonable expectations with his chronic Foley for almost a year, numerous comorbidities and diuretics, and bladder irritants.  -Complete course of doxycycline for possible prostatitis, recent urine culture negative -Anticipate urinary frequency will continue to improve-> could consider 25 mg Myrbetriq or Gemtesa in the future if persistent OAB symptoms, but would wait at least 6 months after PAE as he will likely continue to have improvement after that procedure, and would be at risk for developing recurrent retention -Recommended cutting back on tea as likely contributing to his frequency -RTC 3 months PVR  I spent 45 total minutes on the day of the encounter including pre-visit review of the medical record, face-to-face time with the patient, and post visit ordering of labs/imaging/tests.   Sondra Come, MD  [facility] [address] [place], [place] [address] [phone]

## 2022-07-16 ENCOUNTER — Other Ambulatory Visit: Payer: Self-pay | Admitting: Family Medicine

## 2022-07-16 ENCOUNTER — Telehealth: Payer: Self-pay | Admitting: Family Medicine

## 2022-07-16 DIAGNOSIS — Z86711 Personal history of pulmonary embolism: Secondary | ICD-10-CM

## 2022-07-16 DIAGNOSIS — E1165 Type 2 diabetes mellitus with hyperglycemia: Secondary | ICD-10-CM

## 2022-07-16 NOTE — Telephone Encounter (Signed)
Requested Prescriptions  Pending Prescriptions Disp Refills   cloNIDine (CATAPRES) 0.1 MG tablet [Pharmacy Med Name: CLONIDINE HCL 0.1 MG TAB] 180 tablet 1    Sig: TAKE 1 TABLET BY MOUTH TWICE DAILY FOR BLOOD PRESSURE     Cardiovascular:  Alpha-2 Agonists Failed - 07/16/2022 12:31 PM      Failed - Last BP in normal range    BP Readings from Last 1 Encounters:  07/15/22 (!) 202/99         Passed - Last Heart Rate in normal range    Pulse Readings from Last 1 Encounters:  07/15/22 81         Passed - Valid encounter within last 6 months    Recent Outpatient Visits           1 week ago Benign hypertensive renal disease   [place] [facility] [place], Megan P, DO   2 weeks ago Frequency of urination   [place] [facility] [place], Megan P, DO   3 weeks ago Urinary retention   [place] [facility] [place], Megan P, DO   1 month ago Aortic atherosclerosis [facility])   [place] [facility] [place], Megan P, DO   1 month ago Olecranon bursitis of left elbow   [place] [facility] [place], Oralia Rud, DO       Future Appointments             In 5 days Dorcas Carrow, DO [place] [facility]   In 3 months Richardo Hanks, Laurette Schimke, MD [facility] [place]             omeprazole (PRILOSEC) 40 MG capsule [Pharmacy Med Name: OMEPRAZOLE DR 40 MG CAP] 90 capsule 1    Sig: TAKE 1 CAPSULE BY MOUTH ONCE DAILY FOR HEARTBURN     Gastroenterology: Proton Pump Inhibitors Passed - 07/16/2022 12:31 PM      Passed - Valid encounter within last 12 months    Recent Outpatient Visits           1 week ago Benign hypertensive renal disease   [place] [facility] [place], Megan P, DO   2 weeks ago Frequency of urination   [place] [facility] [place], Megan P, DO   3 weeks ago Urinary retention   [place] [facility] [employer] [place], Megan P, DO   1 month  ago Aortic atherosclerosis [facility])   [place] [facility] [place], Megan P, DO   1 month ago Olecranon bursitis of left elbow   [place] [facility] [place], Oralia Rud, DO       Future Appointments             In 5 days Laural Benes, Oralia Rud, DO  [facility], [facility]   In 3 months Richardo Hanks, Laurette Schimke, MD [facility] [place]

## 2022-07-16 NOTE — Telephone Encounter (Signed)
[facility] from [facility] called in , states,meds, eloquis for 1 month , and rybellsus, that costs, 236.88 for a month  is too expensive for him and is wanting another alternative

## 2022-07-17 ENCOUNTER — Telehealth: Payer: Self-pay

## 2022-07-17 NOTE — Telephone Encounter (Signed)
Called patient to request that he switch appointment scheduled for 07/21/22 to 07/22/22 at 2:20. Dr. Laural Benes would like patient to have an eye exam onsite at our office on 07/22/22 during his visit.  Patient's wife stated patient was not at home but she would have him return my call when he returned.

## 2022-07-17 NOTE — Addendum Note (Signed)
Addended by: Dorcas Carrow on: 07/17/2022 04:17 PM   Modules accepted: Orders

## 2022-07-21 ENCOUNTER — Ambulatory Visit: Payer: PPO | Admitting: Family Medicine

## 2022-07-21 ENCOUNTER — Telehealth: Payer: Self-pay | Admitting: Family Medicine

## 2022-07-21 ENCOUNTER — Ambulatory Visit: Payer: Self-pay | Admitting: *Deleted

## 2022-07-21 DIAGNOSIS — I5032 Chronic diastolic (congestive) heart failure: Secondary | ICD-10-CM

## 2022-07-21 DIAGNOSIS — M7022 Olecranon bursitis, left elbow: Secondary | ICD-10-CM | POA: Diagnosis not present

## 2022-07-21 DIAGNOSIS — E785 Hyperlipidemia, unspecified: Secondary | ICD-10-CM | POA: Diagnosis not present

## 2022-07-21 DIAGNOSIS — Z794 Long term (current) use of insulin: Secondary | ICD-10-CM

## 2022-07-21 DIAGNOSIS — J449 Chronic obstructive pulmonary disease, unspecified: Secondary | ICD-10-CM

## 2022-07-21 DIAGNOSIS — L89151 Pressure ulcer of sacral region, stage 1: Secondary | ICD-10-CM | POA: Diagnosis not present

## 2022-07-21 DIAGNOSIS — F02811 Dementia in other diseases classified elsewhere, unspecified severity, with agitation: Secondary | ICD-10-CM | POA: Diagnosis not present

## 2022-07-21 DIAGNOSIS — I130 Hypertensive heart and chronic kidney disease with heart failure and stage 1 through stage 4 chronic kidney disease, or unspecified chronic kidney disease: Secondary | ICD-10-CM | POA: Diagnosis not present

## 2022-07-21 DIAGNOSIS — E1122 Type 2 diabetes mellitus with diabetic chronic kidney disease: Secondary | ICD-10-CM | POA: Diagnosis not present

## 2022-07-21 DIAGNOSIS — E1169 Type 2 diabetes mellitus with other specified complication: Secondary | ICD-10-CM | POA: Diagnosis not present

## 2022-07-21 DIAGNOSIS — E1165 Type 2 diabetes mellitus with hyperglycemia: Secondary | ICD-10-CM

## 2022-07-21 DIAGNOSIS — Z7984 Long term (current) use of oral hypoglycemic drugs: Secondary | ICD-10-CM | POA: Diagnosis not present

## 2022-07-21 DIAGNOSIS — N1832 Chronic kidney disease, stage 3b: Secondary | ICD-10-CM | POA: Diagnosis not present

## 2022-07-21 NOTE — Telephone Encounter (Signed)
Reason for Disposition . Systolic BP  >= 160 OR Diastolic >= 100 . Health Information question, no triage required and triager able to answer question  Answer Assessment - Initial Assessment Questions 1. BLOOD PRESSURE: "What is the blood pressure?" "Did you take at least two measurements 5 minutes apart?"     178 systolic- notes in referral has been higher, 178/86- did not recheck- next home visit TH/PT 2. ONSET: "When did you take your blood pressure?"     1:30 3. HOW: "How did you take your blood pressure?" (e.g., automatic home BP monitor, visiting nurse)     manual 4. HISTORY: "Do you have a history of high blood pressure?"     yes 5. MEDICINES: "Are you taking any medicines for blood pressure?" "Have you missed any doses recently?"     Yes- no missed 6. OTHER SYMPTOMS: "Do you have any symptoms?" (e.g., blurred vision, chest pain, difficulty breathing, headache, weakness)     no  Answer Assessment - Initial Assessment Questions 1. REASON FOR CALL or QUESTION: "What is your reason for calling today?" or "How can I best help you?" or "What question do you have that I can help answer?"     Home health calling to report elevated BP- systolic 178- no recheck, patient is not symptomatic  Next home visit scheduled TH/PT  Protocols used: Blood Pressure - High-A-AH, Information Only Call - No Triage-A-AH

## 2022-07-21 NOTE — Telephone Encounter (Signed)
Home Health Verbal Orders - Caller/Agency: Elmer Bales from [facility] Callback Number: [phone] Requesting PT Frequency: 2w3x; 1w1x

## 2022-07-21 NOTE — Telephone Encounter (Signed)
That is actually not a bad blood pressure for him. He's being seen weekly. We're working on it.

## 2022-07-21 NOTE — Telephone Encounter (Signed)
  Chief Complaint: Home Health nurse: reporting elevated BP Symptoms: none Frequency: today- elevated systolic 178 Pertinent Negatives: Patient denies blurred vision, chest pain, difficulty breathing, headache, weakness  Disposition: [] ED /[] Urgent Care (no appt availability in office) / [] Appointment(In office/virtual)/ []  [place] [facility]/ [] Home Care/ [] Refused Recommended Disposition /[] [place] [facility]/ [x]  Follow-up with PCP Additional Notes: Home health care nurse calling to report elevated BP- patient does have appointment with provider within disposition of BP reading- 07/22/22

## 2022-07-22 ENCOUNTER — Ambulatory Visit (INDEPENDENT_AMBULATORY_CARE_PROVIDER_SITE_OTHER): Payer: PPO | Admitting: Family Medicine

## 2022-07-22 ENCOUNTER — Telehealth: Payer: Self-pay | Admitting: *Deleted

## 2022-07-22 ENCOUNTER — Encounter: Payer: Self-pay | Admitting: Family Medicine

## 2022-07-22 ENCOUNTER — Telehealth: Payer: Self-pay

## 2022-07-22 VITALS — BP 190/75 | HR 69 | Temp 98.3°F

## 2022-07-22 DIAGNOSIS — R350 Frequency of micturition: Secondary | ICD-10-CM | POA: Diagnosis not present

## 2022-07-22 DIAGNOSIS — Z9119 Patient's noncompliance with other medical treatment and regimen due to financial hardship: Secondary | ICD-10-CM | POA: Diagnosis not present

## 2022-07-22 DIAGNOSIS — I129 Hypertensive chronic kidney disease with stage 1 through stage 4 chronic kidney disease, or unspecified chronic kidney disease: Secondary | ICD-10-CM | POA: Diagnosis not present

## 2022-07-22 LAB — HM DIABETES EYE EXAM

## 2022-07-22 MED ORDER — TORSEMIDE 20 MG PO TABS: 20.0000 mg | ORAL_TABLET | Freq: Two times a day (BID) | ORAL | 3 refills | Status: DC

## 2022-07-22 MED ORDER — AMLODIPINE BESYLATE 5 MG PO TABS: 5.0000 mg | ORAL_TABLET | Freq: Every day | ORAL | 3 refills | Status: DC

## 2022-07-22 NOTE — Progress Notes (Signed)
BP (!) 190/75   Pulse 69   Temp 98.3 F (36.8 C) (Oral)   SpO2 96%    Subjective:    Patient ID: Justin Griffith, male    DOB: 1940/08/11, 82 y.o.   MRN: [id number]  HPI: ZACHERI DALAL is a 82 y.o. male  Chief Complaint  Patient presents with   Urinary Frequency    Patient says he would like to discuss his current medications for "Fluid Pills" and says he is not sleeping normally and says he is using the bathroom all through the night and it is interfering with his sleep pattern.    HYPERTENSION  Hypertension status: uncontrolled  Satisfied with current treatment? no Duration of hypertension: chronic BP monitoring frequency:  a few times a week BP range: 170s/70s BP medication side effects:  having a lot of urination on the torsemide- says he's going to stop taking it Medication compliance: good compliance (pill packed) Previous BP meds:losartan, hydalazine, clonidine, torsemide Aspirin: no Recurrent headaches: no Visual changes: no Palpitations: no Dyspnea: no Chest pain: no Lower extremity edema: no Dizzy/lightheaded: no  Has been peeing every hour or so. Notes that he has been getting up repeatedly at night. Can't sleep. Has talked to his urologist and it's likely because he hasn't used his bladder in a long time due to the foley, but he is very upset about it and doesn't want to take his torsemide. He notes that he's about ready to stop taking all his pills because of this.    Relevant past medical, surgical, family and social history reviewed and updated as indicated. Interim medical history since our last visit reviewed. Allergies and medications reviewed and updated.  Review of Systems  Constitutional: Negative.   Respiratory: Negative.    Cardiovascular: Negative.   Gastrointestinal: Negative.   Genitourinary:  Positive for frequency and urgency. Negative for decreased urine volume, difficulty urinating, dysuria, enuresis, flank pain, genital sores, hematuria,  penile discharge, penile pain, penile swelling, scrotal swelling and testicular pain.  Musculoskeletal: Negative.   Skin: Negative.   Neurological: Negative.   Psychiatric/Behavioral:  Positive for sleep disturbance. Negative for agitation, behavioral problems, confusion, decreased concentration, dysphoric mood, hallucinations, self-injury and suicidal ideas. The patient is not nervous/anxious and is not hyperactive.     Per HPI unless specifically indicated above     Objective:    BP (!) 190/75   Pulse 69   Temp 98.3 F (36.8 C) (Oral)   SpO2 96%   Wt Readings from Last 3 Encounters:  07/09/22 246 lb 9.6 oz (111.9 kg)  06/20/22 247 lb 12.8 oz (112.4 kg)  06/10/22 249 lb 4.8 oz (113.1 kg)    Physical Exam Vitals and nursing note reviewed.  Constitutional:      General: He is not in acute distress.    Appearance: Normal appearance. He is not ill-appearing, toxic-appearing or diaphoretic.  HENT:     Head: Normocephalic and atraumatic.     Right Ear: External ear normal.     Left Ear: External ear normal.     Nose: Nose normal.     Mouth/Throat:     Mouth: Mucous membranes are moist.     Pharynx: Oropharynx is clear.  Eyes:     General: No scleral icterus.       Right eye: No discharge.        Left eye: No discharge.     Extraocular Movements: Extraocular movements intact.     Conjunctiva/sclera: Conjunctivae normal.  Pupils: Pupils are equal, round, and reactive to light.  Cardiovascular:     Rate and Rhythm: Normal rate and regular rhythm.     Pulses: Normal pulses.     Heart sounds: Murmur heard.     No friction rub. No gallop.  Pulmonary:     Effort: Pulmonary effort is normal. No respiratory distress.     Breath sounds: Normal breath sounds. No stridor. No wheezing, rhonchi or rales.  Chest:     Chest wall: No tenderness.  Musculoskeletal:        General: Normal range of motion.     Cervical back: Normal range of motion and neck supple.  Skin:    General:  Skin is warm and dry.     Capillary Refill: Capillary refill takes less than 2 seconds.     Coloration: Skin is not jaundiced or pale.     Findings: No bruising, erythema, lesion or rash.  Neurological:     General: No focal deficit present.     Mental Status: He is alert and oriented to person, place, and time. Mental status is at baseline.  Psychiatric:        Mood and Affect: Mood normal.        Behavior: Behavior normal.        Thought Content: Thought content normal.        Judgment: Judgment normal.     Results for orders placed or performed in visit on 07/15/22  Bladder Scan (Post Void Residual) in office  Result Value Ref Range   Scan Result 2ml       Assessment & Plan:   Problem List Items Addressed This Visit       Genitourinary   Benign hypertensive renal disease - Primary    Still running high. Will add amlodipine and recheck 10 days. To get new pill pack on Friday.       Other Visit Diagnoses     Urinary frequency       Continue to follow with urology- refusing torsemide, will get pulled out of pill pack and decrease to 1x a day. To see Cardiology on Friday. Await their input.   Patient's noncompliance with other medical treatment and regimen due to financial hardship       Unable to afford eliquis or rybelsus. Will get pharmacy involved. Samples of eliquis for 1 month given today to get into pill pack.        Follow up plan: Return in about 13 days (around 08/04/2022).

## 2022-07-22 NOTE — Telephone Encounter (Signed)
Left message for Elmer Bales from [facility] providing verbal OK orders for the patient per Dr Laural Benes. Advised to give our office a call back if she has any questions.

## 2022-07-22 NOTE — Telephone Encounter (Signed)
OK for verbal orders?

## 2022-07-22 NOTE — Progress Notes (Signed)
   Care Guide Note  07/22/2022 Name: Justin Griffith MRN: [id number] DOB: June 22, 1940  Referred by: Dorcas Carrow, DO Reason for referral : Care Coordination (Outreach to schedule with Pharm d )   Justin Griffith is a 82 y.o. year old male who is a primary care patient of Dorcas Carrow, DO. Justin Griffith was referred to the pharmacist for assistance related to DM.    An unsuccessful telephone outreach was attempted today to contact the patient who was referred to the pharmacy team for assistance with medication management. Additional attempts will be made to contact the patient.   Penne Lash, RMA Care Guide [facility] [address]  [place], [place] [address] Direct Dial: [phone] [email or web address]

## 2022-07-22 NOTE — Telephone Encounter (Signed)
  Chief Complaint: Clarification of Med . Symptoms: NA Frequency: NA Pertinent Negatives: Patient denies NA Disposition: [] ED /[] Urgent Care (no appt availability in office) / [] Appointment(In office/virtual)/ []  [place] [facility]/ [] Home Care/ [] Refused Recommended Disposition /[] [place] [facility]/ [x]  Follow-up with PCP Additional Notes:  'Mandy' with [facility] calling. States discussed Torsemide dose as going down to one a day, resent in as twice a day.   Please clarify, called during practice lunch break.   Take 1 tablet (20 mg total) by mouth 2 (two) times daily. Dispense: 60 tablet, Refills: 3 ordered  Take 1 tablet (20 mg total) by mouth 2 (two) times daily. Dispense: 60 tablet, Refills: 3 ordered

## 2022-07-22 NOTE — Patient Instructions (Addendum)
Justin Griffith is going to Call you Friday 1PM to talk about help with your medicines  Dr. Juliann Pares [facility] 11AM Friday  Take 1 torsemide in the AM to see if that helps your peeing

## 2022-07-23 ENCOUNTER — Encounter: Payer: Self-pay | Admitting: Oncology

## 2022-07-23 ENCOUNTER — Telehealth: Payer: Self-pay | Admitting: Family Medicine

## 2022-07-23 DIAGNOSIS — E785 Hyperlipidemia, unspecified: Secondary | ICD-10-CM | POA: Diagnosis not present

## 2022-07-23 DIAGNOSIS — Z7984 Long term (current) use of oral hypoglycemic drugs: Secondary | ICD-10-CM | POA: Diagnosis not present

## 2022-07-23 DIAGNOSIS — I5032 Chronic diastolic (congestive) heart failure: Secondary | ICD-10-CM | POA: Diagnosis not present

## 2022-07-23 DIAGNOSIS — M7022 Olecranon bursitis, left elbow: Secondary | ICD-10-CM | POA: Diagnosis not present

## 2022-07-23 DIAGNOSIS — N1832 Chronic kidney disease, stage 3b: Secondary | ICD-10-CM | POA: Diagnosis not present

## 2022-07-23 DIAGNOSIS — F02811 Dementia in other diseases classified elsewhere, unspecified severity, with agitation: Secondary | ICD-10-CM | POA: Diagnosis not present

## 2022-07-23 DIAGNOSIS — L89151 Pressure ulcer of sacral region, stage 1: Secondary | ICD-10-CM | POA: Diagnosis not present

## 2022-07-23 DIAGNOSIS — I130 Hypertensive heart and chronic kidney disease with heart failure and stage 1 through stage 4 chronic kidney disease, or unspecified chronic kidney disease: Secondary | ICD-10-CM | POA: Diagnosis not present

## 2022-07-23 DIAGNOSIS — E1122 Type 2 diabetes mellitus with diabetic chronic kidney disease: Secondary | ICD-10-CM | POA: Diagnosis not present

## 2022-07-23 DIAGNOSIS — E1169 Type 2 diabetes mellitus with other specified complication: Secondary | ICD-10-CM | POA: Diagnosis not present

## 2022-07-23 NOTE — Telephone Encounter (Signed)
Justin Griffith PT from [facility]  Best contact: [phone]  Pt had a fall last night, was not injured. Lost his balance bending over to pick something up. EMS came to help him off the floor. The pharmacy has some confusion about his Torsemide prescription. Pt is adamant about his torsemide, he does not want to take the pills twice a day but he will not remember anything outside of his pill pack.

## 2022-07-24 ENCOUNTER — Encounter: Payer: Self-pay | Admitting: Family Medicine

## 2022-07-24 ENCOUNTER — Other Ambulatory Visit: Payer: Self-pay | Admitting: Interventional Radiology

## 2022-07-24 ENCOUNTER — Other Ambulatory Visit: Payer: BLUE CROSS/BLUE SHIELD

## 2022-07-24 DIAGNOSIS — E1159 Type 2 diabetes mellitus with other circulatory complications: Secondary | ICD-10-CM | POA: Diagnosis not present

## 2022-07-24 DIAGNOSIS — I214 Non-ST elevation (NSTEMI) myocardial infarction: Secondary | ICD-10-CM | POA: Diagnosis not present

## 2022-07-24 DIAGNOSIS — N401 Enlarged prostate with lower urinary tract symptoms: Secondary | ICD-10-CM

## 2022-07-24 DIAGNOSIS — E1142 Type 2 diabetes mellitus with diabetic polyneuropathy: Secondary | ICD-10-CM | POA: Diagnosis not present

## 2022-07-24 DIAGNOSIS — I509 Heart failure, unspecified: Secondary | ICD-10-CM | POA: Diagnosis not present

## 2022-07-24 DIAGNOSIS — I639 Cerebral infarction, unspecified: Secondary | ICD-10-CM | POA: Diagnosis not present

## 2022-07-24 DIAGNOSIS — J449 Chronic obstructive pulmonary disease, unspecified: Secondary | ICD-10-CM | POA: Diagnosis not present

## 2022-07-24 DIAGNOSIS — Z955 Presence of coronary angioplasty implant and graft: Secondary | ICD-10-CM | POA: Diagnosis not present

## 2022-07-24 DIAGNOSIS — E1169 Type 2 diabetes mellitus with other specified complication: Secondary | ICD-10-CM | POA: Diagnosis not present

## 2022-07-24 DIAGNOSIS — I429 Cardiomyopathy, unspecified: Secondary | ICD-10-CM | POA: Diagnosis not present

## 2022-07-24 DIAGNOSIS — R0602 Shortness of breath: Secondary | ICD-10-CM | POA: Diagnosis not present

## 2022-07-24 DIAGNOSIS — G4733 Obstructive sleep apnea (adult) (pediatric): Secondary | ICD-10-CM | POA: Diagnosis not present

## 2022-07-24 MED ORDER — CONTOUR NEXT TEST VI STRP: ORAL_STRIP | 12 refills | Status: DC

## 2022-07-24 MED ORDER — CONTOUR NEXT EZ W/DEVICE KIT: PACK | 0 refills | Status: DC

## 2022-07-24 MED ORDER — MICROLET LANCETS MISC: 12 refills | Status: DC

## 2022-07-24 MED ORDER — BLOOD PRESSURE MONITOR DEVI: 0 refills | Status: DC

## 2022-07-24 NOTE — Progress Notes (Signed)
07/24/2022 Name: Justin Griffith MRN: [id number] DOB: 03-06-1941  Chief Complaint  Patient presents with   Medication Management   Justin Griffith is a 82 y.o. year old male who presented for a telephone visit.   They were referred to the pharmacist by their PCP for assistance in managing medication access.   Patient is participating in a Managed Medicaid Plan:  No  Subjective:  Care Team: Primary Care Provider: Dorcas Carrow, DO ; Next Scheduled Visit: July 2024  Medication Access/Adherence Current Pharmacy:   [facility] does adherence packaging for patient's medications -Patient reports affordability concerns with their medications: Yes  -Patient reports access/transportation concerns to their pharmacy: No  -Patient reports adherence concerns with their medications:  Yes  without Rybelsus and Eliquis due to cost recently  Diabetes: Current medications: Metformin 500mg  BID, Rybelsus 14mg  daily -Medications tried in the past: Farxiga, Jardiance, Trulicity, Saxenda- could not tolerate these mediations due to allegies/contraindicates -Patient has been without Rybelsus due to not being able to afford medication -Is not checking BG at home, but endorses willingness to do so  Hypertension: Current medications: amlodipine 5mg  daily, clonidine 0.1mg  BID, hydralazine 100mg  TID, losartan 100mg  daily, torsemide 20mg  daily as needed for swelling or SOB -Patient does not have home BP cuff but is interested in obtaining if insurance will cover -Last clinic BP 190/75 on 5/1  Hyperlipidemia/ASCVD Risk Reduction Current lipid lowering medications: rosuvastatin 10mg  daily  Objective:  Lab Results  Component Value Date   HGBA1C 11.5 (H) 06/02/2022   Lab Results  Component Value Date   CREATININE 1.76 (H) 06/30/2022   BUN 36 (H) 06/30/2022   NA 131 (L) 06/30/2022   K 3.9 06/30/2022   CL 99 06/30/2022   CO2 23 06/30/2022   Lab Results  Component Value Date   CHOL 135 06/02/2022    HDL 34 (L) 06/02/2022   LDLCALC 61 06/02/2022   TRIG 249 (H) 06/02/2022   CHOLHDL 4.7 11/01/2021   Medications Reviewed Today     Reviewed by Dorcas Carrow, DO (Physician) on 07/24/22 at 1411  Med List Status: <None>   Medication Order Taking? Sig Documenting Provider Last Dose Status Informant  acyclovir (ZOVIRAX) 400 MG tablet [id number] Yes Take 1 tablet (400 mg total) by mouth 2 (two) times daily.  Patient not taking: Reported on 07/24/2022   Dorcas Carrow, DO Taking Active   amLODipine (NORVASC) 5 MG tablet [id number] Yes Take 1 tablet (5 mg total) by mouth daily. Johnson, Megan P, DO  Active   apixaban (ELIQUIS) 2.5 MG TABS tablet [id number] Yes Take 1 tablet (2.5 mg total) by mouth 2 (two) times daily. Olevia Perches P, DO Taking Active   blood glucose meter kit and supplies [id number] Yes 1 each by Other route daily. Dispense based on patient and insurance preference. Use up to four times daily as directed. (FOR ICD-10 E10.9, E11.9).  Patient not taking: Reported on 07/24/2022   Dorcas Carrow, DO Taking Active            Med Note Littie Deeds, [place] A   Fri Jul 24, 2022  1:47 PM) Does not have  cloNIDine (CATAPRES) 0.1 MG tablet [id number] Yes TAKE 1 TABLET BY MOUTH TWICE DAILY FOR BLOOD PRESSURE Laural Benes, Megan P, DO Taking Active   cyanocobalamin (VITAMIN B12) injection 1,000 mcg [id number]   Olevia Perches P, DO  Active   diclofenac Sodium (VOLTAREN) 1 % GEL [id number] Yes Apply 4 g topically 4 (four) times daily.  Patient not taking: Reported on 07/24/2022   Dorcas Carrow, DO Taking Active   famotidine (PEPCID) 40 MG tablet [id number] Yes Take 1 tablet (40 mg total) by mouth daily.  Patient not taking: Reported on 07/24/2022   Dorcas Carrow, DO Taking Active   ferrous sulfate 325 (65 FE) MG EC tablet [id number] Yes Take 1 tablet (325 mg total) by mouth 2 (two) times daily with a meal.  Patient not taking: Reported on 07/24/2022   Rickard Patience, MD Taking Active   gabapentin (NEURONTIN)  400 MG capsule [id number] Yes Take 1 capsule (400 mg total) by mouth 3 (three) times daily. For nerve pain and itching  Patient taking differently: Take 100 mg by mouth 3 (three) times daily. For nerve pain and itching   Dorcas Carrow, DO Taking Active   GNP VITAMIN C 500 MG tablet [id number] Yes TAKE 1 TABLET BY MOUTH ONCE DAILY TO KEEP YOUR IMMUNE SYSTEM UP Johnson, Megan P, DO Taking Active   hydrALAZINE (APRESOLINE) 100 MG tablet [id number] Yes Take 1 tablet (100 mg total) by mouth 3 (three) times daily. for blood pressure Johnson, Megan P, DO Taking Active   losartan (COZAAR) 100 MG tablet [id number] Yes Take 1 tablet (100 mg total) by mouth daily. For blood pressure Laural Benes, Megan P, DO Taking Active   metFORMIN (GLUCOPHAGE) 500 MG tablet [id number] Yes Take 1 tablet (500 mg total) by mouth 2 (two) times daily with a meal. Laural Benes, Megan P, DO Taking Active   Multiple Vitamin (MULTIVITAMIN WITH MINERALS) TABS tablet [id number] Yes Take 1 tablet by mouth daily. For nutrition  Patient not taking: Reported on 07/24/2022   Dorcas Carrow, DO Taking Active   nystatin (MYCOSTATIN/NYSTOP) powder [id number] Yes Apply 1 Application topically 3 (three) times daily. Olevia Perches P, DO Taking Active   nystatin ointment (MYCOSTATIN) [id number] Yes Apply 1 Application topically 2 (two) times daily. Laural Benes, Megan P, DO Taking Active   Olopatadine HCl 0.2 % SOLN [id number] Yes Apply 1 drop to eye daily. Olevia Perches P, DO Taking Active   omeprazole (PRILOSEC) 40 MG capsule [id number] Yes TAKE 1 CAPSULE BY MOUTH ONCE DAILY FOR HEARTBURN Johnson, Megan P, DO Taking Active   polyethylene glycol powder (GLYCOLAX/MIRALAX) 17 GM/SCOOP powder [id number] Yes Take 17 g by mouth 2 (two) times daily as needed. Johnson, Megan P, DO Taking Active   rosuvastatin (CRESTOR) 10 MG tablet [id number] Yes Take 1 tablet (10 mg total) by mouth at bedtime. For cholesterol Johnson, Megan P, DO Taking Active   Semaglutide (RYBELSUS) 14 MG  TABS [id number] Yes Take 1 tablet (14 mg total) by mouth daily.  Patient not taking: Reported on 07/24/2022   Dorcas Carrow, DO Taking Active   torsemide (DEMADEX) 20 MG tablet [id number] Yes Take 1 tablet (20 mg total) by mouth 2 (two) times daily. Dorcas Carrow, DO  Active            Med Note Littie Deeds, Libra Gatz A   Fri Jul 24, 2022  1:49 PM) 1 daily if needed for swelling, SOB  umeclidinium bromide (INCRUSE ELLIPTA) 62.5 MCG/ACT AEPB [id number] Yes Inhale 1 puff into the lungs daily. For your breathing  Patient not taking: Reported on 07/24/2022   Marjie Skiff, NP Taking Active   Med List Note Sharia Reeve, CPhT 12/14/19 1738): [place] [phone]           Assessment/Plan:   Medication Access/Adherence - Patient was provided 30 day sample of Eliquis  from Dr Laural Benes; this was taken to [facility] to be included in bubble package  - Torsemide left in bottle so patient can now take as needed now - Patient picked up bubble packed medications from pharmacy Friday afternoon - Initiating PAP process for Eliquis  Diabetes: - Currently uncontrolled - Prescriptions for diabetic testing supplies sent to pharmacy but were not ready when patient picked up other medications. Will call Monday to verify insurance coverage and inform patient - Meets financial criteria for Rybelsus patient assistance program through [place]. Will collaborate with provider, CPhT, and patient to pursue assistance.   Hypertension: - Currently uncontrolled - Prescription for BP monitor sent to pharmacy but was not ready with other medications; will call pharmacy Monday to verify coverage and inform patient  Hyperlipidemia/ASCVD Risk Reduction: - Currently moderately controlled.  - Continue current regimen and regular follow-up with PCP   Follow Up Plan: Will follow progress of PAP applications and follow-up with patient regularly in an attempt to get BP and BG better controlled  Lenna Gilford, PharmD,  DPLA

## 2022-07-24 NOTE — Assessment & Plan Note (Signed)
Still running high. Will add amlodipine and recheck 10 days. To get new pill pack on Friday.

## 2022-07-27 ENCOUNTER — Ambulatory Visit: Payer: Self-pay | Admitting: *Deleted

## 2022-07-27 ENCOUNTER — Telehealth: Payer: Self-pay

## 2022-07-27 NOTE — Telephone Encounter (Signed)
  Chief Complaint: Corrie Dandy, nurse with [facility] called in to report a fall which occurred Friday night. Symptoms: No injuries, cuts bruises, etc.  Ambulating fine. Frequency: Happened Friday night on his way back from the kitchen.   He dropped something and when he bent down to pick it up he lost his balance and fell on his butt. Pertinent Negatives: Patient denies any injuries and nothing seen during the nurses' exam. Disposition: [] ED /[] Urgent Care (no appt availability in office) / [] Appointment(In office/virtual)/ []  [place] [place] [facility]/ [] Home Care/ [] Refused Recommended Disposition /[] [place] [facility]/ [x]  Follow-up with PCP Additional Notes: Message sent to [facility] for Dr. Aundra Millet Johnson's information.

## 2022-07-27 NOTE — Progress Notes (Signed)
   07/27/2022  Patient ID: Justin Griffith, male   DOB: 1941-03-17, 82 y.o.   MRN: [id number]  Contacted pharmacy to check coverage of diabetic testing supplies and blood pressure monitor.  Glucometer, test strips, and lancets are going through at no charge.    [facility] is not able to bill insurance for a BP monitor, but they do sell them OTC.  Patient's insurance plan should have OTC benefit for approximately $30 each quarter.  Will give patient their phone number to call and check on this.    Recommending that patient have pharmacist at [facility] instruct on testing supply use; but if they are not able to for some reason, I certainly can.  Providing my direct number if any assistance is needed.  Also informing patient that PAP applications are being initiated to get Eliquis and Rybelsus for him; I will track progress and keep him posted.  Lenna Gilford, PharmD, DPLA

## 2022-07-27 NOTE — Telephone Encounter (Signed)
Reason for Disposition  [1] Recent fall AND [2] no injury    [facility] nurse calling in to report a fall with no injuries.  Answer Assessment - Initial Assessment Questions 1. MECHANISM: "How did the fall happen?"     [facility] - Corrie Dandy calling in to report a fall. He fell last Friday at night on the way back from the kitchen where he went to make a sandwich.   He dropped something and when he bent down to pick it up he lost his balance and fell landing on his butt.   He is walking fine. Denies any pain.   No bruises noted.   2. DOMESTIC VIOLENCE AND ELDER ABUSE SCREENING: "Did you fall because someone pushed you or tried to hurt you?" If Yes, ask: "Are you safe now?"     N/A 3. ONSET: "When did the fall happen?" (e.g., minutes, hours, or days ago)     Friday night during the night 4. LOCATION: "What part of the body hit the ground?" (e.g., back, buttocks, head, hips, knees, hands, head, stomach)     No injuries.   He fell on his butt 5. INJURY: "Did you hurt (injure) yourself when you fell?" If Yes, ask: "What did you injure? Tell me more about this?" (e.g., body area; type of injury; pain severity)"     No injuries 6. PAIN: "Is there any pain?" If Yes, ask: "How bad is the pain?" (e.g., Scale 1-10; or mild,  moderate, severe)   - NONE (0): No pain   - MILD (1-3): Doesn't interfere with normal activities    - MODERATE (4-7): Interferes with normal activities or awakens from sleep    - SEVERE (8-10): Excruciating pain, unable to do any normal activities      No pain 7. SIZE: For cuts, bruises, or swelling, ask: "How large is it?" (e.g., inches or centimeters)      No cuts, bruising, etc. 8. PREGNANCY: "Is there any chance you are pregnant?" "When was your last menstrual period?"     N/A 9. OTHER SYMPTOMS: "Do you have any other symptoms?" (e.g., dizziness, fever, weakness; new onset or worsening).      No symptoms 10. CAUSE: "What do you think caused the fall (or  falling)?" (e.g., tripped, dizzy spell)       He lost his balance when he bent over to pick up something he dropped.  Protocols used: Falls and [facility]

## 2022-07-27 NOTE — Telephone Encounter (Signed)
Noted  

## 2022-07-28 ENCOUNTER — Telehealth: Payer: Self-pay

## 2022-07-28 ENCOUNTER — Other Ambulatory Visit (HOSPITAL_COMMUNITY): Payer: Self-pay

## 2022-07-28 ENCOUNTER — Encounter: Payer: Self-pay | Admitting: Oncology

## 2022-07-28 DIAGNOSIS — I69351 Hemiplegia and hemiparesis following cerebral infarction affecting right dominant side: Secondary | ICD-10-CM | POA: Diagnosis not present

## 2022-07-28 DIAGNOSIS — J449 Chronic obstructive pulmonary disease, unspecified: Secondary | ICD-10-CM | POA: Diagnosis not present

## 2022-07-28 DIAGNOSIS — I509 Heart failure, unspecified: Secondary | ICD-10-CM | POA: Diagnosis not present

## 2022-07-28 NOTE — Telephone Encounter (Signed)
-----   Message from Lenna Gilford, [facility] sent at 07/26/2022  2:27 PM EDT ----- Good afternoon!  Would you all mind to initiated PAP applications for Justin Griffith for Eliquis and Rybelsus?  Thank you!

## 2022-07-28 NOTE — Telephone Encounter (Signed)
I Have INITIATED PAP. I have mailed pt application for ELIQUIS (BMS)  to pt home and APPLIED ON LINE for RYBELSUS (NORO NORDISK) ON PT BEHALF.   Marland Kitchen Georga [facility] Patient Advocate [phone]

## 2022-07-29 ENCOUNTER — Telehealth: Payer: Self-pay | Admitting: Family Medicine

## 2022-07-29 NOTE — Telephone Encounter (Signed)
Justin Griffith with [facility] is calling in to report a fall. Per Corrie Dandy, pt was in a restaurant and slipped on the floor and landed on his bottom. This happened on 07/28/22. Corrie Dandy says pt is walking fine and doesn't have an bruising but is experiencing soreness. Corrie Dandy also says pt fell last week as well.

## 2022-07-30 ENCOUNTER — Encounter: Payer: Self-pay | Admitting: Family Medicine

## 2022-07-30 ENCOUNTER — Telehealth: Payer: Self-pay

## 2022-07-30 NOTE — Progress Notes (Signed)
   07/30/2022  Patient ID: Justin Griffith, male   DOB: 06-17-1940, 82 y.o.   MRN: [id number]  Patient outreach to provide the following information to Justin Griffith:  -Telephone number for insurance plan to inquire about OTC benefit that should cover BP monitor -Rybelsus PAP has been initiated online and hopefully will not need any action on his part -Eliquis patient portion of PAP application has been mailed to him to complete and mail back in  Patient stated he has not received his diabetic testing supplies from [facility] [employer] -Suggested that he call to set up delivery if needed, but he plans to go pick up tomorrow -Educated on 3 components he will be picking up  Will follow up once I know PAP statuses to keep him informed.  Will also make sure he is doing okay with testing supplies and see how home BG has been.  Lenna Gilford, PharmD, DPLA

## 2022-07-30 NOTE — Telephone Encounter (Signed)
Noted  

## 2022-08-03 ENCOUNTER — Telehealth: Payer: Self-pay

## 2022-08-03 ENCOUNTER — Encounter: Payer: Self-pay | Admitting: Family Medicine

## 2022-08-03 ENCOUNTER — Ambulatory Visit (INDEPENDENT_AMBULATORY_CARE_PROVIDER_SITE_OTHER): Payer: PPO | Admitting: Family Medicine

## 2022-08-03 VITALS — BP 170/70 | HR 67 | Temp 98.4°F | Wt 256.4 lb

## 2022-08-03 DIAGNOSIS — Z794 Long term (current) use of insulin: Secondary | ICD-10-CM | POA: Diagnosis not present

## 2022-08-03 DIAGNOSIS — D519 Vitamin B12 deficiency anemia, unspecified: Secondary | ICD-10-CM | POA: Diagnosis not present

## 2022-08-03 DIAGNOSIS — I129 Hypertensive chronic kidney disease with stage 1 through stage 4 chronic kidney disease, or unspecified chronic kidney disease: Secondary | ICD-10-CM | POA: Diagnosis not present

## 2022-08-03 DIAGNOSIS — E1165 Type 2 diabetes mellitus with hyperglycemia: Secondary | ICD-10-CM | POA: Diagnosis not present

## 2022-08-03 MED ORDER — AMLODIPINE BESYLATE 10 MG PO TABS: 10.0000 mg | ORAL_TABLET | Freq: Every day | ORAL | 3 refills | Status: DC

## 2022-08-03 NOTE — Telephone Encounter (Signed)
Prescription has been hand written and placed in provider's folder for signature. Please advise?

## 2022-08-03 NOTE — Telephone Encounter (Signed)
-----   Message from Dorcas Carrow, DO sent at 08/03/2022  2:19 PM EDT ----- Elevated toilet seat x2 please

## 2022-08-03 NOTE — Progress Notes (Signed)
BP (!) 170/70   Pulse 67   Temp 98.4 F (36.9 C) (Oral)   Wt 256 lb 6.4 oz (116.3 kg)   SpO2 97%   BMI 36.79 kg/m    Subjective:    Patient ID: Justin Griffith, male    DOB: 05-21-1940, 82 y.o.   MRN: [id number]  HPI: Justin Griffith is a 82 y.o. male  Chief Complaint  Patient presents with   Hypertension   HYPERTENSION  Hypertension status: uncontrolled  Satisfied with current treatment? yes Duration of hypertension: chronic BP medication side effects:  no Medication compliance: good compliance Previous BP meds:amlodipine, hydralazine, clonidine, spironalactone, losartan, torsemide PRN Aspirin: no Recurrent headaches: no Visual changes: no Palpitations: no Dyspnea: no Chest pain: no Lower extremity edema: no Dizzy/lightheaded: no   Relevant past medical, surgical, family and social history reviewed and updated as indicated. Interim medical history since our last visit reviewed. Allergies and medications reviewed and updated.  Review of Systems  Constitutional: Negative.   Respiratory: Negative.    Cardiovascular: Negative.   Gastrointestinal: Negative.   Genitourinary:  Positive for frequency and urgency. Negative for decreased urine volume, difficulty urinating, dysuria, enuresis, flank pain, genital sores, hematuria, penile discharge, penile pain, penile swelling, scrotal swelling and testicular pain.  Musculoskeletal: Negative.     Per HPI unless specifically indicated above     Objective:    BP (!) 170/70   Pulse 67   Temp 98.4 F (36.9 C) (Oral)   Wt 256 lb 6.4 oz (116.3 kg)   SpO2 97%   BMI 36.79 kg/m   Wt Readings from Last 3 Encounters:  08/03/22 256 lb 6.4 oz (116.3 kg)  07/09/22 246 lb 9.6 oz (111.9 kg)  06/20/22 247 lb 12.8 oz (112.4 kg)    Physical Exam Vitals and nursing note reviewed.  Constitutional:      General: He is not in acute distress.    Appearance: Normal appearance. He is obese. He is not ill-appearing, toxic-appearing  or diaphoretic.  HENT:     Head: Normocephalic and atraumatic.     Right Ear: External ear normal.     Left Ear: External ear normal.     Nose: Nose normal.     Mouth/Throat:     Mouth: Mucous membranes are moist.     Pharynx: Oropharynx is clear.  Eyes:     General: No scleral icterus.       Right eye: No discharge.        Left eye: No discharge.     Extraocular Movements: Extraocular movements intact.     Conjunctiva/sclera: Conjunctivae normal.     Pupils: Pupils are equal, round, and reactive to light.  Cardiovascular:     Rate and Rhythm: Normal rate and regular rhythm.     Pulses: Normal pulses.     Heart sounds: Normal heart sounds. No murmur heard.    No friction rub. No gallop.  Pulmonary:     Effort: Pulmonary effort is normal. No respiratory distress.     Breath sounds: Normal breath sounds. No stridor. No wheezing, rhonchi or rales.  Chest:     Chest wall: No tenderness.  Musculoskeletal:        General: Normal range of motion.     Cervical back: Normal range of motion and neck supple.  Skin:    General: Skin is warm and dry.     Capillary Refill: Capillary refill takes less than 2 seconds.     Coloration: Skin  is not jaundiced or pale.     Findings: No bruising, erythema, lesion or rash.  Neurological:     General: No focal deficit present.     Mental Status: He is alert and oriented to person, place, and time. Mental status is at baseline.  Psychiatric:        Mood and Affect: Mood normal.        Behavior: Behavior normal.        Thought Content: Thought content normal.        Judgment: Judgment normal.     Results for orders placed or performed in visit on 07/30/22  HM DIABETES EYE EXAM  Result Value Ref Range   HM Diabetic Eye Exam No Retinopathy No Retinopathy      Assessment & Plan:   Problem List Items Addressed This Visit       Endocrine   Uncontrolled type 2 diabetes mellitus with hyperglycemia, with long-term current use of insulin (HCC)     Has been improving. Off his rybelsus for the past 2 weeks due to cost. Was approved today. Will get him restarted when supply comes in. Due for A1c recheck in June.         Genitourinary   Benign hypertensive renal disease - Primary    BP still running high. Will increase his amlodipine to 10mg  at his next pill pack on 5/31. Call with any concerns.         Follow up plan: Return in about 2 weeks (around 08/17/2022).

## 2022-08-03 NOTE — Telephone Encounter (Signed)
Received notification from [employer] regarding approval for RYBELSUS. Patient assistance approved from 07/25/2022 to 03/23/2023.  Phone: [phone]  [facility] [phone]) [phone]

## 2022-08-03 NOTE — Assessment & Plan Note (Signed)
BP still running high. Will increase his amlodipine to 10mg  at his next pill pack on 5/31. Call with any concerns.

## 2022-08-03 NOTE — Assessment & Plan Note (Signed)
Has been improving. Off his rybelsus for the past 2 weeks due to cost. Was approved today. Will get him restarted when supply comes in. Due for A1c recheck in June.

## 2022-08-04 ENCOUNTER — Telehealth: Payer: Self-pay | Admitting: Family Medicine

## 2022-08-04 NOTE — Telephone Encounter (Signed)
Home Health nurse called to report that the patient's weight is outside of the parameter. He weighs 257 today.   [facility] wants to know if the PCP wants to fax a new weight parameter.  Fax: [phone]   [facility] from [facility] contact: [phone]

## 2022-08-06 ENCOUNTER — Telehealth: Payer: Self-pay | Admitting: Family Medicine

## 2022-08-06 NOTE — Telephone Encounter (Signed)
[facility] nurse Corrie Dandy called to report that the patient's weight is outside of the parameter. He weighs 254.4 today.    [facility] wants to know if the PCP is fine with his weight and if so they need a new perimeter form faxed to [phone]. If provider is not ok with his weight them advise of next steps.   Mary from [facility] contact: [facility] @ [phone]; fax#[phone]

## 2022-08-06 NOTE — Telephone Encounter (Signed)
I dont know what this means.

## 2022-08-07 ENCOUNTER — Telehealth: Payer: Self-pay

## 2022-08-07 NOTE — Telephone Encounter (Signed)
Spoke with Clydie Braun from [facility] services to provide their facility to provide the verbal orders or request a form be fax to our office per Dr Henriette Combs request for the requested new weigh perimeter of 249-lbs too 259-lbs. Clydie Braun took the call back information and says all the clinical staff were in a meeting in case someone needed to give our office a call back.   OK for [facility] to give note if [facility] calls back.

## 2022-08-07 NOTE — Telephone Encounter (Signed)
That sounds great. If they send the form I'll sign, otherwise if they can take a verbal for 249-259 that'd be great.

## 2022-08-07 NOTE — Progress Notes (Signed)
   08/07/2022  Patient ID: Justin Griffith, male   DOB: [birth year], 82 y.o.   MRN: [id number]  Outreach attempt to verify patient received Eliquis PAP application as well as diabetic testing supplies.  No answer and not able to leave voicemail.  Will try to contact again next week.  Lenna Gilford, PharmD, DPLA

## 2022-08-11 ENCOUNTER — Telehealth: Payer: Self-pay | Admitting: Family Medicine

## 2022-08-11 NOTE — Telephone Encounter (Signed)
Spoke with Elmer Bales with [facility] and provided her with the new weight perimeter from 249-lbs to 259-lbs. She says she will add that order and have the form sent over to Dr Laural Benes for signature. Per Irving Burton, she says the nurse advised Mr Ponce to bring his machine and test strips to his appointment he has with Dr Laural Benes on Thursday, as the patient is not able to give [place] the name of the equipment. Irving Burton was also provided with verbal OK for extension to Mr Krom PT orders. Irving Burton verbalized understanding and says she will try and see if Mr Obarr is able to spell the information from his machine and she give our office a call back. Patient has started taking the Torsemide prescription as instructed on Saturday.

## 2022-08-11 NOTE — Telephone Encounter (Unsigned)
Copied from CRM [id number]. Topic: General - Other >> Aug 11, 2022  1:13 PM Epimenio Foot F wrote: Reason for CRM: Justin Griffith, a PT with [facility] is calling in to extend pt orders for pt. Requested extension for 2 weeks 2 1 week 2. Pt's test trip for his glucometer don't work, they don't fit and will need different strips for the meter, and also emily's assistant has been calling to repoty weight gain 255 last week he's been staying failry close to that and do they want her to update the paramater and concern is the weight gain in itself. Pt stopped torsemide prior to the weight gain he had saw his johnson and carwell and was told to take it as needed and had all of those within a couple days and now he is taking it once a day and would like to know what dr. Laural Benes wants him to do and pt says he's taking it once a day and wants to know if he should continue taking it that way.

## 2022-08-11 NOTE — Telephone Encounter (Signed)
Please find out what test strips he needs, what machine he is using. I do want him to take his torsemide 1x a day. I thought we already gave an updated weight parameter.

## 2022-08-11 NOTE — Telephone Encounter (Signed)
Justin Griffith with [facility] is calling in because Pt's test trip for his glucometer don't fit. Irving Burton says pt will need different strips for the meter, and also emily's assistant has been calling to report pt's weight gain. Pt is currently 255. Irving Burton wants to know  does Dr. Laural Benes  want her to update the paramater for the weight.  Pt stopped Torsemide prior to the weight gain. Pt had seen Dr. Nada Libman?, and per pt was told to take the medication as needed. Within a couple of days pt was showing symptoms of needing the medication. Irving Burton would like to know what Dr. Henriette Combs thought are regarding the weight gain and Torsemide and would like a clarification on the dosage and frequency of the Torsemide. Please follow up with Irving Burton. Irving Burton says if she doesn't answer to leave a voicemail.

## 2022-08-13 ENCOUNTER — Ambulatory Visit (INDEPENDENT_AMBULATORY_CARE_PROVIDER_SITE_OTHER): Payer: PPO | Admitting: Family Medicine

## 2022-08-13 ENCOUNTER — Encounter: Payer: Self-pay | Admitting: Family Medicine

## 2022-08-13 ENCOUNTER — Telehealth: Payer: Self-pay

## 2022-08-13 VITALS — BP 136/70 | HR 60 | Temp 97.8°F | Wt 253.6 lb

## 2022-08-13 DIAGNOSIS — M79602 Pain in left arm: Secondary | ICD-10-CM

## 2022-08-13 DIAGNOSIS — I5032 Chronic diastolic (congestive) heart failure: Secondary | ICD-10-CM | POA: Diagnosis not present

## 2022-08-13 DIAGNOSIS — Z794 Long term (current) use of insulin: Secondary | ICD-10-CM | POA: Diagnosis not present

## 2022-08-13 DIAGNOSIS — I129 Hypertensive chronic kidney disease with stage 1 through stage 4 chronic kidney disease, or unspecified chronic kidney disease: Secondary | ICD-10-CM | POA: Diagnosis not present

## 2022-08-13 DIAGNOSIS — E1165 Type 2 diabetes mellitus with hyperglycemia: Secondary | ICD-10-CM

## 2022-08-13 MED ORDER — APIXABAN 5 MG PO TABS: ORAL_TABLET | ORAL | 0 refills | Status: DC

## 2022-08-13 MED ORDER — RYBELSUS 3 MG PO TABS: 3.0000 mg | ORAL_TABLET | Freq: Every day | ORAL | 0 refills | Status: DC

## 2022-08-13 MED ORDER — RYBELSUS 14 MG PO TABS: ORAL_TABLET | ORAL | 0 refills | Status: AC

## 2022-08-13 MED ORDER — RYBELSUS 14 MG PO TABS: 14.0000 mg | ORAL_TABLET | Freq: Every day | ORAL | 3 refills | Status: DC

## 2022-08-13 NOTE — Telephone Encounter (Signed)
4 boxes of Rybelsus received for the patient. Medication given to the patient at today's appointment.

## 2022-08-13 NOTE — Patient Instructions (Signed)
You are OK to eat fruit. You're OK to eat a banana a day- just don't eat 3 or 4.

## 2022-08-13 NOTE — Progress Notes (Signed)
BP 136/70   Pulse 60   Temp 97.8 F (36.6 C) (Oral)   Wt 253 lb 9.6 oz (115 kg)   SpO2 99%   BMI 36.39 kg/m    Subjective:    Patient ID: Justin Griffith, male    DOB: 1940-08-23, 82 y.o.   MRN: [id number]  HPI: Justin Griffith is a 82 y.o. male  Chief Complaint  Patient presents with   Diabetes   Urinary frequency has not gotten better. He did not do well with being off the torsemide- he started having more swelling so he restarted his medicine. He seems more comfortable today, but states that he is still feeling badly.   HYPERTENSION  Hypertension status: controlled  Satisfied with current treatment? yes Duration of hypertension: chronic BP monitoring frequency:  not checking BP medication side effects:  no Medication compliance: excellent compliance Aspirin: no Recurrent headaches: no Visual changes: no Palpitations: no Dyspnea: no Chest pain: no Lower extremity edema: yes Dizzy/lightheaded: no  He was able to get his rybelsus, but he has been off of it a month. Has not been testing his sugars.   Relevant past medical, surgical, family and social history reviewed and updated as indicated. Interim medical history since our last visit reviewed. Allergies and medications reviewed and updated.  Review of Systems  Constitutional: Negative.   Respiratory: Negative.    Cardiovascular:  Positive for leg swelling. Negative for chest pain and palpitations.  Gastrointestinal: Negative.   Musculoskeletal:  Positive for myalgias (L arm pain). Negative for arthralgias, back pain, gait problem, joint swelling, neck pain and neck stiffness.  Neurological: Negative.   Psychiatric/Behavioral: Negative.      Per HPI unless specifically indicated above     Objective:    BP 136/70   Pulse 60   Temp 97.8 F (36.6 C) (Oral)   Wt 253 lb 9.6 oz (115 kg)   SpO2 99%   BMI 36.39 kg/m   Wt Readings from Last 3 Encounters:  08/13/22 253 lb 9.6 oz (115 kg)  08/03/22 256 lb 6.4  oz (116.3 kg)  07/09/22 246 lb 9.6 oz (111.9 kg)    Physical Exam Vitals and nursing note reviewed.  Constitutional:      General: He is not in acute distress.    Appearance: Normal appearance. He is obese. He is not ill-appearing, toxic-appearing or diaphoretic.  HENT:     Head: Normocephalic and atraumatic.     Right Ear: External ear normal.     Left Ear: External ear normal.     Nose: Nose normal.     Mouth/Throat:     Mouth: Mucous membranes are moist.     Pharynx: Oropharynx is clear.  Eyes:     General: No scleral icterus.       Right eye: No discharge.        Left eye: No discharge.     Extraocular Movements: Extraocular movements intact.     Conjunctiva/sclera: Conjunctivae normal.     Pupils: Pupils are equal, round, and reactive to light.  Cardiovascular:     Rate and Rhythm: Normal rate and regular rhythm.     Pulses: Normal pulses.     Heart sounds: Normal heart sounds. No murmur heard.    No friction rub. No gallop.  Pulmonary:     Effort: Pulmonary effort is normal. No respiratory distress.     Breath sounds: Normal breath sounds. No stridor. No wheezing, rhonchi or rales.  Chest:  Chest wall: No tenderness.  Musculoskeletal:        General: Normal range of motion.     Cervical back: Normal range of motion and neck supple.  Skin:    General: Skin is warm and dry.     Capillary Refill: Capillary refill takes less than 2 seconds.     Coloration: Skin is not jaundiced or pale.     Findings: No bruising, erythema, lesion or rash.  Neurological:     General: No focal deficit present.     Mental Status: He is alert and oriented to person, place, and time. Mental status is at baseline.  Psychiatric:        Mood and Affect: Mood normal.        Behavior: Behavior normal.        Thought Content: Thought content normal.        Judgment: Judgment normal.     Results for orders placed or performed in visit on 07/30/22  HM DIABETES EYE EXAM  Result Value Ref  Range   HM Diabetic Eye Exam No Retinopathy No Retinopathy      Assessment & Plan:   Problem List Items Addressed This Visit       Cardiovascular and Mediastinum   Chronic diastolic CHF (congestive heart failure) (HCC) - Primary    Doing better back on torsemide. Call with any concerns. Continue to follow with cardiology.       Relevant Medications   apixaban (ELIQUIS) 5 MG TABS tablet     Endocrine   Uncontrolled type 2 diabetes mellitus with hyperglycemia, with long-term current use of insulin (HCC)    Will get him restarted on rybelsus. Will start at 3mg  and titrate up to 14mg . Recheck A1c in 3 months as he's been off medicine for a month.       Relevant Medications   Semaglutide (RYBELSUS) 3 MG TABS (Start on 08/21/2022)   Semaglutide (RYBELSUS) 14 MG TABS (Start on 09/20/2022)   Semaglutide (RYBELSUS) 14 MG TABS     Genitourinary   Benign hypertensive renal disease    Under good control today. Has not increased to 10mg  amlodipine yet. Will recheck in 1 week prior to starting 10mg  in pill pack and see if he needs to go up.       Other Visit Diagnoses     Left arm pain       EKG today- discussed to call if it happens again.   Relevant Orders   EKG 12-Lead (Completed)        Follow up plan: Return in about 1 week (around 08/20/2022).

## 2022-08-14 ENCOUNTER — Encounter: Payer: Self-pay | Admitting: Oncology

## 2022-08-14 ENCOUNTER — Ambulatory Visit: Payer: PPO | Admitting: Family Medicine

## 2022-08-14 ENCOUNTER — Other Ambulatory Visit: Payer: Self-pay | Admitting: Family Medicine

## 2022-08-14 DIAGNOSIS — E785 Hyperlipidemia, unspecified: Secondary | ICD-10-CM

## 2022-08-14 DIAGNOSIS — H35373 Puckering of macula, bilateral: Secondary | ICD-10-CM | POA: Diagnosis not present

## 2022-08-14 DIAGNOSIS — H35363 Drusen (degenerative) of macula, bilateral: Secondary | ICD-10-CM | POA: Diagnosis not present

## 2022-08-14 DIAGNOSIS — Z961 Presence of intraocular lens: Secondary | ICD-10-CM | POA: Diagnosis not present

## 2022-08-14 DIAGNOSIS — H524 Presbyopia: Secondary | ICD-10-CM | POA: Diagnosis not present

## 2022-08-14 DIAGNOSIS — E113293 Type 2 diabetes mellitus with mild nonproliferative diabetic retinopathy without macular edema, bilateral: Secondary | ICD-10-CM | POA: Diagnosis not present

## 2022-08-14 DIAGNOSIS — H5712 Ocular pain, left eye: Secondary | ICD-10-CM | POA: Diagnosis not present

## 2022-08-14 LAB — HM DIABETES EYE EXAM

## 2022-08-14 NOTE — Telephone Encounter (Signed)
Requested Prescriptions  Refused Prescriptions Disp Refills   rosuvastatin (CRESTOR) 10 MG tablet [Pharmacy Med Name: ROSUVASTATIN CALCIUM 10 MG TAB] 90 tablet 1    Sig: TAKE 1 TABLET BY MOUTH AT BEDTIME FOR CHOLESTEROL     Cardiovascular:  Antilipid - Statins 2 Failed - 08/14/2022 11:31 AM      Failed - Cr in normal range and within 360 days    Creatinine  Date Value Ref Range Status  06/30/2022 1.76 (H) 0.61 - 1.24 mg/dL Final         Failed - Lipid Panel in normal range within the last 12 months    Cholesterol, Total  Date Value Ref Range Status  06/02/2022 135 100 - 199 mg/dL Final   Cholesterol Piccolo, Waived  Date Value Ref Range Status  02/02/2017 89 <200 mg/dL Final    Comment:                            Desirable                <200                         Borderline High      200- 239                         High                     >239    LDL Chol Calc (NIH)  Date Value Ref Range Status  06/02/2022 61 0 - 99 mg/dL Final   HDL  Date Value Ref Range Status  06/02/2022 34 (L) >39 mg/dL Final   Triglycerides  Date Value Ref Range Status  06/02/2022 249 (H) 0 - 149 mg/dL Final   Triglycerides Piccolo,Waived  Date Value Ref Range Status  02/02/2017 262 (H) <150 mg/dL Final    Comment:                            Normal                   <150                         Borderline High     150 - 199                         High                200 - 499                         Very High                >499          Passed - Patient is not pregnant      Passed - Valid encounter within last 12 months    Recent Outpatient Visits           Yesterday Left arm pain   [place] [facility] [place], Megan P, DO   1 week ago Benign hypertensive renal disease   [place] [facility] [place], Megan P, DO   3 weeks ago Benign hypertensive renal disease  [place] [facility] [place], [place] P, DO   1 month ago Benign  hypertensive renal disease   [place] [facility] [employer], Megan P, DO   1 month ago Frequency of urination   [place] [facility] [place], [place], DO       Future Appointments             In 6 days Dorcas Carrow, DO [place] [facility], [facility]   In 2 months Richardo Hanks, Laurette Schimke, MD [facility] [place]             hydrALAZINE (APRESOLINE) 100 MG tablet [Pharmacy Med Name: HYDRALAZINE HCL 100 MG TAB] 270 tablet 1    Sig: TAKE 1 TABLET BY MOUTH 3 TIMES DAILY FORBLOOD PRESSURE     Cardiovascular:  Vasodilators Failed - 08/14/2022 11:31 AM      Failed - HCT in normal range and within 360 days    HCT  Date Value Ref Range Status  06/30/2022 28.9 (L) 39.0 - 52.0 % Final   Hematocrit  Date Value Ref Range Status  06/25/2022 29.9 (L) 37.5 - 51.0 % Final         Failed - HGB in normal range and within 360 days    Hemoglobin  Date Value Ref Range Status  06/30/2022 9.0 (L) 13.0 - 17.0 g/dL Final  16/12/9602 9.4 (L) 13.0 - 17.7 g/dL Final         Failed - RBC in normal range and within 360 days    RBC  Date Value Ref Range Status  06/30/2022 3.46 (L) 4.22 - 5.81 MIL/uL Final   RBC.  Date Value Ref Range Status  06/30/2022 3.41 (L) 4.22 - 5.81 MIL/uL Final         Failed - ANA Screen, Ifa, Serum in normal range and within 360 days    No results found for: "ANA", "ANATITER", "LABANTI"       Passed - WBC in normal range and within 360 days    WBC  Date Value Ref Range Status  06/22/2022 6.4 4.0 - 10.5 K/uL Final   WBC Count  Date Value Ref Range Status  06/30/2022 7.5 4.0 - 10.5 K/uL Final         Passed - PLT in normal range and within 360 days    Platelets  Date Value Ref Range Status  06/25/2022 322 150 - 450 x10E3/uL Final   Platelet Count  Date Value Ref Range Status  06/30/2022 288 150 - 400 K/uL Final         Passed - Last BP in normal range    BP Readings from Last 1 Encounters:  08/13/22  136/70         Passed - Valid encounter within last 12 months    Recent Outpatient Visits           Yesterday Left arm pain   [place] [facility] [place], [place] P, DO   1 week ago Benign hypertensive renal disease   [place] [facility] [place], Megan P, DO   3 weeks ago Benign hypertensive renal disease   [place] [facility] [place], Megan P, DO   1 month ago Benign hypertensive renal disease   [place] [facility] [place], Megan P, DO   1 month ago Frequency of urination   [place] [name] [place], [place], DO       Future  Appointments             In 6 days Laural Benes, Oralia Rud, DO [facility], [facility]   In 2 months Richardo Hanks, Laurette Schimke, MD [facility]

## 2022-08-15 NOTE — Assessment & Plan Note (Signed)
Will get him restarted on rybelsus. Will start at 3mg  and titrate up to 14mg . Recheck A1c in 3 months as he's been off medicine for a month.

## 2022-08-15 NOTE — Assessment & Plan Note (Signed)
Under good control today. Has not increased to 10mg  amlodipine yet. Will recheck in 1 week prior to starting 10mg  in pill pack and see if he needs to go up.

## 2022-08-15 NOTE — Assessment & Plan Note (Signed)
Doing better back on torsemide. Call with any concerns. Continue to follow with cardiology.

## 2022-08-16 NOTE — Progress Notes (Signed)
Interpreted by me 08/13/22. NSR with 1st degree AV block 61bpm, flipped t-waves III and AVF

## 2022-08-18 ENCOUNTER — Ambulatory Visit
Admission: RE | Admit: 2022-08-18 | Discharge: 2022-08-18 | Disposition: A | Discharge status: Home / Self Care | Payer: BLUE CROSS/BLUE SHIELD | Source: Ambulatory Visit | Attending: Interventional Radiology | Admitting: Interventional Radiology

## 2022-08-18 DIAGNOSIS — N401 Enlarged prostate with lower urinary tract symptoms: Secondary | ICD-10-CM | POA: Diagnosis not present

## 2022-08-18 HISTORY — PX: IR RADIOLOGIST EVAL & MGMT: IMG5224

## 2022-08-18 NOTE — Progress Notes (Signed)
Referring Physician(s): Cherene Julian, MD  Reason for follow up:  Virtual telephone clinic visit 3 months status post prostate artery embolization  History of present illness: Initial HPI (02/27/22): Justin Griffith is a 82 y.o. male presenting today via virtual telephone consultation at the gracious referral of Dr. Richardo Hanks for management of benign prostatic hyperplasia.  His clinical course has been complicated by recurrent urinary tract infections and gross hematuria in the setting of chronic Foley catheter dependence.  His prostate is reported to measure approximately 175 grams.  Dr. Richardo Hanks had originally planned for HOLEP procedure, however due to hematuria and PE (now on Eliquis) in June 2023, this was deferred.  His current indwelling 22 Fr Foley catheter was last exchanged on 02/02/22 in the emergency department.   At our 1 month follow up, he was likely close to having the Foley removed as he was able to urinate around it.  Unfortunately he had hematuria with clotting which resulted in ED visit --> admission for management in late March. He was able to eventually have his catheter removed.  He followed up with Dr. Richardo Hanks on 07/15/22 at which time he remained catheter free, had a PVR of 0, but was complaining of severe urgency.    He is now on torsemide for edema, urinating frequently, about every 30-45 minutes.  Mixed volumes with urination. No hematuria.  Mild dysuria occasionally.  Describes urination as very easy, doesn't have to strain.     Past Medical History:  Diagnosis Date   Acute urinary retention    Arthritis    Asthma 2000   Back pain    Colon polyp 2012   COPD (chronic obstructive pulmonary disease) (HCC)    Emphysema of lung (HCC)    Heart murmur    Hernia 2000   History of cold sores 05/06/2020   History of colon cancer    Hyperlipidemia    Hypertension    Personal history of malignant neoplasm of large intestine    Personal history of tobacco use, presenting  hazards to health    Septic shock (HCC) 11/18/2021   Sleep apnea    Special screening for malignant neoplasms, colon    Type 2 diabetes mellitus with proteinuria (HCC) 10/11/2014    Past Surgical History:  Procedure Laterality Date   CARDIAC CATHETERIZATION Left 09/25/2015   Procedure: Left Heart Cath and Coronary Angiography;  Surgeon: Alwyn Pea, MD;  Location: [facility];  Service: Cardiovascular;  Laterality: Left;   CHOLECYSTECTOMY  1970   COLON SURGERY  07-16-99   sigmoid colon resection with primary anastomosis, chemotherapy for metastatic disease   COLONOSCOPY  2001, 2012   Dr Lemar Livings, tubular adenoma of the cecum and ascending colon in 2012.   COLONOSCOPY WITH PROPOFOL N/A 02/12/2016   Procedure: COLONOSCOPY WITH PROPOFOL;  Surgeon: Earline Mayotte, MD;  Location: [facility];  Service: Endoscopy;  Laterality: N/A;   CORONARY STENT INTERVENTION N/A 05/17/2020   Procedure: CORONARY STENT INTERVENTION;  Surgeon: Alwyn Pea, MD;  Location: [facility];  Service: Cardiovascular;  Laterality: N/A;  LAD   ESOPHAGOGASTRODUODENOSCOPY (EGD) WITH PROPOFOL N/A 02/12/2016   Procedure: ESOPHAGOGASTRODUODENOSCOPY (EGD) WITH PROPOFOL;  Surgeon: Earline Mayotte, MD;  Location: [facility] ENDOSCOPY;  Service: Endoscopy;  Laterality: N/A;   HERNIA REPAIR Right    right inguinal hernia repair   IR 3D INDEPENDENT WKST  05/07/2022   IR ANGIOGRAM PELVIS SELECTIVE OR SUPRASELECTIVE  05/07/2022   IR ANGIOGRAM PELVIS SELECTIVE OR  SUPRASELECTIVE  05/07/2022   IR ANGIOGRAM SELECTIVE EACH ADDITIONAL VESSEL  05/07/2022   IR EMBO ARTERIAL NOT HEMORR HEMANG INC GUIDE ROADMAPPING  05/07/2022   IR RADIOLOGIST EVAL & MGMT  02/27/2022   IR RADIOLOGIST EVAL & MGMT  03/25/2022   IR RADIOLOGIST EVAL & MGMT  06/01/2022   IR US GUIDE VASC ACCESS LEFT  05/07/2022   JOINT REPLACEMENT Bilateral    knee replacement   KNEE SURGERY Bilateral 2010   LEFT HEART CATH AND CORONARY ANGIOGRAPHY N/A  05/17/2020   Procedure: LEFT HEART CATH AND CORONARY ANGIOGRAPHY possible PCI and stent;  Surgeon: Alwyn Pea, MD;  Location: [facility];  Service: Cardiovascular;  Laterality: N/A;   MYRINGOTOMY WITH TUBE PLACEMENT Bilateral 08/16/2014   Procedure: MYRINGOTOMY WITH TUBE PLACEMENT;  Surgeon: Bud Face, MD;  Location: [facility] OR[facility];  Service: ENT;  Laterality: Bilateral;   PERIPHERAL VASCULAR CATHETERIZATION Right 09/04/2015   Procedure: Lower Extremity Angiography;  Surgeon: Renford Dills, MD;  Location: [facility];  Service: Cardiovascular;  Laterality: Right;   PERIPHERAL VASCULAR CATHETERIZATION  09/04/2015   Procedure: Lower Extremity Intervention;  Surgeon: Renford Dills, MD;  Location: [facility];  Service: Cardiovascular;;   TONSILLECTOMY     TYMPANOSTOMY TUBE PLACEMENT      Allergies: Farxiga [dapagliflozin], Dulaglutide, Liraglutide, and Jardiance [empagliflozin]  Medications: Prior to Admission medications   Medication Sig Start Date End Date Taking? Authorizing Provider  acyclovir (ZOVIRAX) 400 MG tablet Take 1 tablet (400 mg total) by mouth 2 (two) times daily. Patient not taking: Reported on 07/24/2022 03/26/22   Olevia Perches P, DO  amLODipine (NORVASC) 10 MG tablet Take 1 tablet (10 mg total) by mouth daily. 08/03/22   Johnson, Megan P, DO  apixaban (ELIQUIS) 2.5 MG TABS tablet Take 1 tablet (2.5 mg total) by mouth 2 (two) times daily. 07/03/22   Olevia Perches P, DO  apixaban (ELIQUIS) 5 MG TABS tablet 1/2 tab BID x 30 days 08/13/22   Olevia Perches P, DO  blood glucose meter kit and supplies 1 each by Other route daily. Dispense based on patient and insurance preference. Use up to four times daily as directed. (FOR ICD-10 E10.9, E11.9). 02/10/22   Olevia Perches P, DO  Blood Glucose Monitoring Suppl (CONTOUR NEXT EZ) w/Device KIT Use to check blood glucose daily E11.65 Type 2 Diabetes 07/24/22   Olevia Perches P, DO  Blood Pressure Monitor  DEVI Use to monitor blood pressure daily 07/24/22   Johnson, Megan P, DO  cloNIDine (CATAPRES) 0.1 MG tablet TAKE 1 TABLET BY MOUTH TWICE DAILY FOR BLOOD PRESSURE 07/16/22   Laural Benes, Megan P, DO  gabapentin (NEURONTIN) 400 MG capsule Take 1 capsule (400 mg total) by mouth 3 (three) times daily. For nerve pain and itching Patient not taking: Reported on 08/03/2022 05/20/22   Olevia Perches P, DO  glucose blood (CONTOUR NEXT TEST) test strip Use to check blood glucose once daily E11.65 Type 2 Diabetes 07/24/22   Olevia Perches P, DO  GNP VITAMIN C 500 MG tablet TAKE 1 TABLET BY MOUTH ONCE DAILY TO KEEP YOUR IMMUNE SYSTEM UP 04/17/22   Olevia Perches P, DO  hydrALAZINE (APRESOLINE) 100 MG tablet Take 1 tablet (100 mg total) by mouth 3 (three) times daily. for blood pressure 04/30/22   Johnson, Megan P, DO  losartan (COZAAR) 100 MG tablet Take 1 tablet (100 mg total) by mouth daily. For blood pressure 04/09/22   Johnson, Megan P, DO  metFORMIN (GLUCOPHAGE) 500 MG tablet Take 1 tablet (500 mg total) by mouth 2 (two) times daily with a meal. 06/02/22   Dorcas Carrow, DO  Microlet Lancets MISC Use to check blood glucose daily E11.65 Type 2 Diabetes 07/24/22   Olevia Perches P, DO  nystatin (MYCOSTATIN/NYSTOP) powder Apply 1 Application topically 3 (three) times daily. 04/03/22   Johnson, Megan P, DO  nystatin ointment (MYCOSTATIN) Apply 1 Application topically 2 (two) times daily. 05/14/22   Johnson, Megan P, DO  Olopatadine HCl 0.2 % SOLN Apply 1 drop to eye daily. 04/09/22   Johnson, Megan P, DO  omeprazole (PRILOSEC) 40 MG capsule TAKE 1 CAPSULE BY MOUTH ONCE DAILY FOR HEARTBURN 07/16/22   Johnson, Megan P, DO  polyethylene glycol powder (GLYCOLAX/MIRALAX) 17 GM/SCOOP powder Take 17 g by mouth 2 (two) times daily as needed. 03/02/22   Johnson, Megan P, DO  rosuvastatin (CRESTOR) 10 MG tablet Take 1 tablet (10 mg total) by mouth at bedtime. For cholesterol 04/09/22   Johnson, Megan P, DO  Semaglutide (RYBELSUS) 14 MG  TABS 1/2 tab daily for 30 days 09/20/22   Olevia Perches P, DO  Semaglutide (RYBELSUS) 14 MG TABS Take 1 tablet (14 mg total) by mouth daily. 08/13/22   Johnson, Megan P, DO  Semaglutide (RYBELSUS) 3 MG TABS Take 1 tablet (3 mg total) by mouth daily. 08/21/22 09/20/22  Olevia Perches P, DO  torsemide (DEMADEX) 20 MG tablet Take 1 tablet (20 mg total) by mouth 2 (two) times daily. 07/22/22   Johnson, Megan P, DO  umeclidinium bromide (INCRUSE ELLIPTA) 62.5 MCG/ACT AEPB Inhale 1 puff into the lungs daily. For your breathing Patient not taking: Reported on 07/24/2022 02/18/22   Marjie Skiff, NP     Family History  Problem Relation Age of Onset   Diabetes Father    Esophageal cancer Mother    Alzheimer's disease Paternal Uncle     Social History   Socioeconomic History   Marital status: Married    Spouse name: Not on file   Number of children: Not on file   Years of education: Not on file   Highest education level: GED or equivalent  Occupational History   Occupation: retired    Comment: [employer]  Tobacco Use   Smoking status: Former    Packs/day: 1.00    Years: 30.00    Additional pack years: 0.00    Total pack years: 30.00    Types: Cigarettes    Quit date: 03/23/1989    Years since quitting: 33.4    Passive exposure: Past   Smokeless tobacco: Never  Vaping Use   Vaping Use: Never used  Substance and Sexual Activity   Alcohol use: No    Alcohol/week: 0.0 standard drinks of alcohol   Drug use: No   Sexual activity: Not Currently  Other Topics Concern   Not on file  Social History Narrative   [employer]    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    Social Determinants of Health   Financial Resource Strain: Low Risk  (03/03/2022)   Overall Financial Resource Strain (CARDIA)    Difficulty of Paying Living Expenses: Not hard at all  Food Insecurity: No Food Insecurity (06/23/2022)   Hunger Vital Sign    Worried About Running Out of Food in the Last Year: Never true    Ran Out of Food in  the Last Year: Never true  Transportation Needs: No Transportation Needs (06/23/2022)   PRAPARE - Transportation  Lack of Transportation (Medical): No    Lack of Transportation (Non-Medical): No  Physical Activity: Inactive (03/03/2022)   Exercise Vital Sign    Days of Exercise per Week: 0 days    Minutes of Exercise per Session: 0 min  Stress: No Stress Concern Present (03/03/2022)   [employer] of Occupational Health - Occupational Stress Questionnaire    Feeling of Stress : Only a little  Social Connections: Moderately Integrated (03/03/2022)   Social Connection and Isolation Panel [NHANES]    Frequency of Communication with Friends and Family: More than three times a week    Frequency of Social Gatherings with Friends and Family: More than three times a week    Attends Religious Services: More than 4 times per year    Active Member of [employer] or Organizations: No    Attends Banker Meetings: Never    Marital Status: Married     Vital Signs: There were no vitals taken for this visit.  No physical examination was performed in lieu of virtual telephone clinic visit.  Imaging: CT AP 01/30/22   7.7 x 6.7 x 7.9 cm = 213 cc   PAE 05/07/22  Right PA  Left PA  Labs:  CBC: Recent Labs    06/21/22 0546 06/22/22 0519 06/25/22 1340 06/30/22 1400  WBC 7.0 6.4 8.5 7.5  HGB 8.3* 8.6* 9.4* 9.0*  HCT 27.0* 27.8* 29.9* 28.9*  PLT 298 280 322 288    COAGS: Recent Labs    11/17/21 2142 11/18/21 0329 11/18/21 1756 11/19/21 0147 11/20/21 0439 05/07/22 0823 06/13/22 1752  INR 2.9* 2.6*  --   --   --  1.1 1.2  APTT 44*  --  83* 68* 70*  --  29    BMP: Recent Labs    06/20/22 0604 06/21/22 0546 06/22/22 0519 06/25/22 1340 06/30/22 1400  NA 133* 135 132* 135 131*  K 4.4 4.4 4.5 4.3 3.9  CL 98 98 96* 98 99  CO2 27 29 29 22 23   GLUCOSE 295* 198* 277* 215* 139*  BUN 53* 52* 53* 25 36*  CALCIUM 9.4 9.6 9.3 10.5* 9.5  CREATININE 1.52* 1.50*  1.52* 1.23 1.76*  GFRNONAA 45* 46* 45*  --  38*    LIVER FUNCTION TESTS: Recent Labs    05/07/22 0823 06/02/22 1432 06/13/22 1454 06/30/22 1400  BILITOT 0.5 0.2 0.6 0.5  AST 12* 7 14* 18  ALT 8 12 11 12   ALKPHOS 62 92 80 66  PROT 7.3 6.9 8.0 7.3  ALBUMIN 3.0* 3.6* 3.3* 3.3*    Assessment and Plan: 82 year old male with history of benign prostatic hyperplasia with catheter dependence and hematuria now status post prostate artery embolization on 05/07/22.  The right prostatic artery was dominant supply of the prostate gland therefore the diminutive left prostatic artery was not embolized. Overall, he has recovered well and now is catheter free.  His main complaint currently is urinary frequency, which he attributes to diuretics.  No difficulty urinating.    Plan to follow up in 3 months.    Electronically Signed: Bennie Dallas 08/18/2022, 12:12 PM   I spent a total of 25 Minutes in virtual telephone clinical consultation, greater than 50% of which was counseling/coordinating care for benign prostatic hyperplasia.

## 2022-08-19 ENCOUNTER — Telehealth: Payer: Self-pay | Admitting: Family Medicine

## 2022-08-19 ENCOUNTER — Telehealth: Payer: Self-pay

## 2022-08-19 NOTE — Progress Notes (Signed)
   08/19/2022  Patient ID: Justin Griffith, male   DOB: Aug 17, 1940, 82 y.o.   MRN: [id number]  Patient outreach attempt to see if he had received the Eliquis PAP application mailed 5/7.  Also, wanting to see how he is tolerating Rybelsus so far (PAP med picked up 5/23).  Note in chart indicating he did not have correct test strips to go with meter; following up on that, too.  Unable to reach and attempted to leave voicemail; but I am not sure if it recorded.  Patient sees Dr. Laural Benes tomorrow, so I will forward this to her to see how I can further assist.  Lenna Gilford, PharmD, DPLA

## 2022-08-19 NOTE — Telephone Encounter (Signed)
Luisa Hart Nurse Case Manager from [facility] his weight was 261 this am after his breakfast, has not taken his morning medicines yet.   Best contact: [phone]

## 2022-08-20 ENCOUNTER — Encounter: Payer: Self-pay | Admitting: Family Medicine

## 2022-08-20 ENCOUNTER — Ambulatory Visit (INDEPENDENT_AMBULATORY_CARE_PROVIDER_SITE_OTHER): Payer: PPO | Admitting: Family Medicine

## 2022-08-20 VITALS — BP 159/70 | HR 74 | Temp 97.5°F | Ht 70.0 in | Wt 260.2 lb

## 2022-08-20 DIAGNOSIS — I5032 Chronic diastolic (congestive) heart failure: Secondary | ICD-10-CM

## 2022-08-20 DIAGNOSIS — I129 Hypertensive chronic kidney disease with stage 1 through stage 4 chronic kidney disease, or unspecified chronic kidney disease: Secondary | ICD-10-CM | POA: Diagnosis not present

## 2022-08-20 NOTE — Patient Instructions (Signed)
Take 2 of your torsemide in the AM for the next week.

## 2022-08-20 NOTE — Assessment & Plan Note (Signed)
Continues to have swelling. No rales today. Will increase his torsemide to 40mg  in the AM and none in the PM. Recheck 1 week. Call with any concerns.

## 2022-08-20 NOTE — Progress Notes (Signed)
BP (!) 159/70   Pulse 74   Temp (!) 97.5 F (36.4 C) (Oral)   Ht 5\' 10"  (1.778 m)   Wt 260 lb 3.2 oz (118 kg)   SpO2 97%   BMI 37.33 kg/m    Subjective:    Patient ID: Justin Griffith, male    DOB: [birth year], 82 y.o.   MRN: [id number]  HPI: Justin Griffith is a 82 y.o. male  Chief Complaint  Patient presents with   Congestive Heart Failure   Has been taking is torsemide 1x a day. He still feels like he's peeing often, but he was able to sleep 3 hours last night without getting up to pee. No SOB. No CP. He is otherwise feeling OK.   HYPERTENSION  Hypertension status:  better- starting 10mg  amlodipine next week   Satisfied with current treatment? yes Duration of hypertension: chronic BP monitoring frequency:  rarely BP medication side effects:  no Medication compliance: good compliance Aspirin: no Recurrent headaches: no Visual changes: no Palpitations: no Dyspnea: no Chest pain: no Lower extremity edema: yes Dizzy/lightheaded: no    Relevant past medical, surgical, family and social history reviewed and updated as indicated. Interim medical history since our last visit reviewed. Allergies and medications reviewed and updated.  Review of Systems  Constitutional: Negative.   Respiratory: Negative.    Cardiovascular:  Positive for leg swelling. Negative for chest pain and palpitations.  Gastrointestinal: Negative.   Musculoskeletal: Negative.   Neurological: Negative.   Psychiatric/Behavioral: Negative.      Per HPI unless specifically indicated above     Objective:    BP (!) 159/70   Pulse 74   Temp (!) 97.5 F (36.4 C) (Oral)   Ht 5\' 10"  (1.778 m)   Wt 260 lb 3.2 oz (118 kg)   SpO2 97%   BMI 37.33 kg/m   Wt Readings from Last 3 Encounters:  08/20/22 260 lb 3.2 oz (118 kg)  08/13/22 253 lb 9.6 oz (115 kg)  08/03/22 256 lb 6.4 oz (116.3 kg)    Physical Exam Vitals and nursing note reviewed.  Constitutional:      General: He is not in acute  distress.    Appearance: Normal appearance. He is obese. He is not ill-appearing, toxic-appearing or diaphoretic.  HENT:     Head: Normocephalic and atraumatic.     Right Ear: External ear normal.     Left Ear: External ear normal.     Nose: Nose normal.     Mouth/Throat:     Mouth: Mucous membranes are moist.     Pharynx: Oropharynx is clear.  Eyes:     General: No scleral icterus.       Right eye: No discharge.        Left eye: No discharge.     Extraocular Movements: Extraocular movements intact.     Conjunctiva/sclera: Conjunctivae normal.     Pupils: Pupils are equal, round, and reactive to light.  Cardiovascular:     Rate and Rhythm: Normal rate and regular rhythm.     Pulses: Normal pulses.     Heart sounds: Normal heart sounds. No murmur heard.    No friction rub. No gallop.  Pulmonary:     Effort: Pulmonary effort is normal. No respiratory distress.     Breath sounds: Normal breath sounds. No stridor. No wheezing, rhonchi or rales.  Chest:     Chest wall: No tenderness.  Musculoskeletal:  General: Normal range of motion.     Cervical back: Normal range of motion and neck supple.     Right lower leg: Edema present.     Left lower leg: Edema present.  Skin:    General: Skin is warm and dry.     Capillary Refill: Capillary refill takes less than 2 seconds.     Coloration: Skin is not jaundiced or pale.     Findings: No bruising, erythema, lesion or rash.  Neurological:     General: No focal deficit present.     Mental Status: He is alert and oriented to person, place, and time. Mental status is at baseline.  Psychiatric:        Mood and Affect: Mood normal.        Behavior: Behavior normal.        Thought Content: Thought content normal.        Judgment: Judgment normal.     Results for orders placed or performed in visit on 08/14/22  HM DIABETES EYE EXAM  Result Value Ref Range   HM Diabetic Eye Exam Retinopathy (A) No Retinopathy      Assessment &  Plan:   Problem List Items Addressed This Visit       Cardiovascular and Mediastinum   Chronic diastolic CHF (congestive heart failure) (HCC) - Primary    Continues to have swelling. No rales today. Will increase his torsemide to 40mg  in the AM and none in the PM. Recheck 1 week. Call with any concerns.         Genitourinary   Benign hypertensive renal disease    Improved, but still running a little high. Starting higher dose of amlodipine with pill pack tomorrow. Recheck on Wednesday. Call with any concerns.         Follow up plan: Return for Wednesday AM, OK to double book.

## 2022-08-20 NOTE — Assessment & Plan Note (Signed)
Improved, but still running a little high. Starting higher dose of amlodipine with pill pack tomorrow. Recheck on Wednesday. Call with any concerns.

## 2022-08-21 ENCOUNTER — Ambulatory Visit (INDEPENDENT_AMBULATORY_CARE_PROVIDER_SITE_OTHER): Payer: PPO

## 2022-08-21 ENCOUNTER — Telehealth: Payer: PPO

## 2022-08-21 DIAGNOSIS — Z794 Long term (current) use of insulin: Secondary | ICD-10-CM

## 2022-08-21 DIAGNOSIS — I5032 Chronic diastolic (congestive) heart failure: Secondary | ICD-10-CM

## 2022-08-21 DIAGNOSIS — E1165 Type 2 diabetes mellitus with hyperglycemia: Secondary | ICD-10-CM

## 2022-08-21 DIAGNOSIS — R296 Repeated falls: Secondary | ICD-10-CM

## 2022-08-21 NOTE — Patient Instructions (Addendum)
Please call the care guide team at [phone] if you need to cancel or reschedule your appointment.   If you are experiencing a Mental Health or Behavioral Health Crisis or need someone to talk to, please call the Suicide and Crisis Lifeline: 988 call the [place] National Suicide Prevention Lifeline: 571-351-7678 or TTY: 828-575-1338 TTY (312) 422-8575) to talk to a trained counselor call 1-800-273-TALK (toll free, 24 hour hotline)   Following is a copy of the CCM Program Consent:  CCM service includes personalized support from designated clinical staff supervised by the physician, including individualized plan of care and coordination with other care providers 24/7 contact phone numbers for assistance for urgent and routine care needs. Service will only be billed when office clinical staff spend 20 minutes or more in a month to coordinate care. Only one practitioner may furnish and bill the service in a calendar month. The patient may stop CCM services at amy time (effective at the end of the month) by phone call to the office staff. The patient will be responsible for cost sharing (co-pay) or up to 20% of the service fee (after annual deductible is met)  Following is a copy of your full provider care plan:   Goals Addressed             This Visit's Progress    CCM Expected Outcome:  Monitor, Self-Manage and Reduce Symptoms of  COPD         Current Barriers:  Knowledge Deficits related to factors that may cause the patient to have an exacerbation of COPD Care Coordination needs related to cost constraints  in a patient with COPD and needing coverage help with [employer] Chronic Disease Management support and education needs related to effective management of COPD 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   Planned Interventions: Provided patient with basic written and verbal COPD education on self  care/management/and exacerbation prevention. The patient denies any issues with his breathing. The patient states his COPD is stable. The patient is having elevations in his blood pressures. Sees the pcp regularly. Adjustment of medications. The patient denies any new issues with his breathing or COPD. Advised patient to track and manage COPD triggers. Review of factors that cause exacerbation. The patient states his breathing is stable right now Provided written and verbal instructions on pursed lip breathing and utilized returned demonstration as teach back Provided instruction about proper use of medications used for management of COPD including inhalers. States compliance with medications.  Advised patient to self assesses COPD action plan zone and make appointment with provider if in the yellow zone for 48 hours without improvement Provided education about and advised patient to utilize infection prevention strategies to reduce risk of respiratory infection Discussed the importance of adequate rest and management of fatigue with COPD Screening for signs and symptoms of depression related to chronic disease state  Assessed social determinant of health barriers Pharm D referral for assistance with [employer] cost constraints. The patient is working with the pharm D.   Symptom Management: Take medications as prescribed   Attend all scheduled provider appointments Call provider office for new concerns or questions  call the Suicide and Crisis Lifeline: 988 call the [place] National Suicide Prevention Lifeline: (760) 275-8002 or TTY: (684) 448-0024 TTY (304)255-2561) to talk to a trained counselor call 1-800-273-TALK (toll free, 24 hour hotline) if experiencing a Mental Health or Behavioral Health Crisis  avoid second hand smoke eliminate smoking in  my home identify and remove indoor air pollutants limit outdoor activity during cold weather listen for public air quality announcements every day develop  a rescue plan eliminate symptom triggers at home follow rescue plan if symptoms flare-up  Follow Up Plan: Telephone follow up appointment with care management team member scheduled for: 09-25-2022 at 1145 am       CCM Expected Outcome:  Monitor, Self-Manage and Reduce Symptoms of Diabetes       Current Barriers:  Knowledge Deficits related to the importance of good blood sugar control in a patient with DM Care Coordination needs related to ongoing education and support for management of medications and disease management  in a patient with DM Chronic Disease Management support and education needs related to effective management of DM Lacks caregiver support.   Lab Results  Component Value Date   HGBA1C 11.5 (H) 06/02/2022     Planned Interventions: Provided education to patient about basic DM disease process. DM is out of control again. The patient states he is taking his medications and watching what he eats. The patient sometimes does forget to take his medications. Review and education provided. Education on making sure the patient takes his medication as instructed ; Reviewed medications with patient and discussed importance of medication adherence. The patient is now using pill packs and he says this is working out well for him. Encouraged him to take his medications as prescribed and call for any questions or concerns. Denies any issues with his DM at this time. Will continue to monitor. DM is stable. Reviewed prescribed diet with patient heart healthy/ADA diet. The patient states compliance with heart healthy/Ada diet. Education given on monitoring for hidden sodium; Counseled on importance of regular laboratory monitoring as prescribed. Has regular labwork        Discussed plans with patient for ongoing care management follow up and provided patient with direct contact information for care management team;      Provided patient with written educational materials related to hypo and  hyperglycemia and importance of correct treatment;       Reviewed scheduled/upcoming provider appointments including: 08-26-2022, reminder given today;         call provider for findings outside established parameters;       Review of patient status, including review of consultants reports, relevant laboratory and other test results, and medications completed;       Advised patient to discuss changes in his DM and questions or concerns with provider;      Screening for signs and symptoms of depression related to chronic disease state;        Assessed social determinant of health barriers;         Symptom Management: Take medications as prescribed   Attend all scheduled provider appointments Call provider office for new concerns or questions  call the Suicide and Crisis Lifeline: 988 call the [place] National Suicide Prevention Lifeline: 803-135-7230 or TTY: (602) 675-0390 TTY 239-028-6084) to talk to a trained counselor call 1-800-273-TALK (toll free, 24 hour hotline) if experiencing a Mental Health or Behavioral Health Crisis  check feet daily for cuts, sores or redness trim toenails straight across manage portion size wash and dry feet carefully every day wear comfortable, cotton socks wear comfortable, well-fitting shoes  Follow Up Plan: Telephone follow up appointment with care management team member scheduled for: 09-25-2022 at 1145 am       CCM Expected Outcome:  Monitor, Self-Manage and Reduce Symptoms of Heart Failure  Current Barriers:  Knowledge Deficits related to the importance of monitoring for changes in water weight and effective management of HF Chronic Disease Management support and education needs related to effective management of HF Lacks caregiver support.  Wt Readings from Last 3 Encounters:  08/20/22 260 lb 3.2 oz (118 kg)  08/13/22 253 lb 9.6 oz (115 kg)  08/03/22 256 lb 6.4 oz (116.3 kg)    BP Readings from Last 3 Encounters:  08/20/22 (!) 159/70  08/13/22  136/70  08/03/22 (!) 170/70     Planned Interventions: Basic overview and discussion of pathophysiology of Heart Failure reviewed. The patients weight is up and down.  The patient has been having elevated blood pressures. He had not taken his medications at the time of the call because he stated he was "peeing like crazy". Education on the purpose of the medication changes by Dr. Laural Benes when he was in the office on 08-20-2022 and the need to take as directed. Long standing history of non-compliance with the plan of care. He says home health PT will be out today to see him at 1 pm and the nurse was there yesterday, Luisa Hart. Education provided. Review of the instructions provided by Dr. Laural Benes on his visit on 08-20-2022. Provided education on low sodium diet. The patient states he does not add salt to his foods. He uses a product called "no salt". The patient states that he is good about not eating foods high in sodium.  Reminder provided on making sure he follows a heart healthy/ADA diet. Review and education provided Reviewed Heart Failure Action Plan in depth and provided written copy. Home health is reporting weight changes to the pcp.  Assessed need for readable accurate scales in home. Discussed safety in weighing. The patient has a hight fall risk. Uses a walker. The patient had not taken his medication or weighed at the time of the outreach. Review of the benefits of weights and weight fluctuations determining exacerbations in HF. The patient states he is going to the bathroom and he feels fine. Denies any shortness of breath.  Provided education about placing scale on hard, flat surface Advised patient to weigh each morning after emptying bladder. The patient no longer has an indwelling catheter but he is having to go to the bathroom frequently. The patient is thankful not to have the catheter. He says he will not do that again as it was not pleasant. Education on being safe and preventing falls.   Discussed importance of daily weight and advised patient to weigh and record daily. The patient is having fluctuations of weights at this time. Education on the benefits of daily weights and monitoring for weight changes of +2/3 pounds in one day and +5 pounds in a week.  Reviewed role of diuretics in prevention of fluid overload and management of heart failure. The patient states he is taking his medications as prescribed. The patient saw the pcp yesterday, instructed to take Torsemide 40 mg every am. The patient has not taken his medications this am as he states he has been going to the bathroom frequently. Education on following the recommendations of the pcp.   Discussed the importance of keeping all appointments with provider. The patient has a follow up with the pcp on 08-26-2022  reminder provided today.   Provided patient with education about the role of exercise in the management of heart failure Advised patient to discuss changes in his heart failure or heart health with provider Screening for signs and  symptoms of depression related to chronic disease state  Assessed social determinant of health barriers  Symptom Management: Take medications as prescribed   Attend all scheduled provider appointments Call provider office for new concerns or questions  call the Suicide and Crisis Lifeline: 988 call the [place] National Suicide Prevention Lifeline: 531-314-2854 or TTY: (575)054-2039 TTY 615-508-1422) to talk to a trained counselor call 1-800-273-TALK (toll free, 24 hour hotline) if experiencing a Mental Health or Behavioral Health Crisis  call office if I gain more than 2 pounds in one day or 5 pounds in one week keep legs up while sitting use salt in moderation watch for swelling in feet, ankles and legs every day weigh myself daily develop a rescue plan follow rescue plan if symptoms flare-up track symptoms and what helps feel better or worse dress right for the weather, hot or  cold  Follow Up Plan: Telephone follow up appointment with care management team member scheduled for: 09-25-2022 at 1145 am       CCM Expected Outcome:  Monitor, Self-Manage and Reduce Symptoms of: Falls and safety concerns       Current Barriers:  Knowledge Deficits related to safety concerns and falls prevention in patient with multiple chronic conditions and history of falls with injury Care Coordination needs related to resources in the community and ongoing support in management of chronic conditions and fall prevention in a patient with history of falls with most recent being February 2024 Chronic Disease Management support and education needs related to effective management of falls prevention and safety concerns in a patient with multiple chronic conditions Lacks caregiver support.   Planned Interventions: Provided  verbal education re: potential causes of falls and Fall prevention strategies. The patient denies any new falls and states that he is doing well. Does not want to go back to the hospital. Review of reporting new falls to the provider if they occur.  Reviewed medications and discussed potential side effects of medications such as dizziness and frequent urination. The patient states he cannot remember why he fell recently. He states that he is taking his medications. Patient no longer has the foley catheter but goes to the bathroom frequently. Review of monitoring for safety concerns and being careful  Advised patient of importance of notifying provider of falls. The patient states he had a fall "3 weeks" ago and he does not remember why he fell. He sees the pcp on 08-26-2022 for follow up. Education on reporting new falls.  Assessed for signs and symptoms of orthostatic hypotension. The patient is having elevation in blood pressures. Denies any headaches.  Assessed for falls since last encounter. Last fall occurred "3 weeks ago" per the patient. Education and fall prevention reviewed.  Last fall reported was in February 2024. Denies any new falls. Assessed patients knowledge of fall risk prevention secondary to previously provided education Provided patient information for fall alert systems Assessed working status of life alert bracelet and patient adherence Advised patient to discuss changes in mobility and new falls with provider. Patient will have a follow up with the pcp on 07-21-2022. The patient wants [facility] to come back in his home and work with him. He states that was helpful. Will let Dr. Laural Benes know of the patients request.  Review of discussing changes in his condition with the pcp. The patient has a history of high fall risk. Education provided. Iantha Fallen is in the home working with the patient. No new concerns at this time with falls and safety  concerns.  Screening for signs and symptoms of depression related to chronic disease state Assessed social determinant of health barriers  Symptom Management: Take medications as prescribed   Attend all scheduled provider appointments Call provider office for new concerns or questions  call the Suicide and Crisis Lifeline: 988 call the [place] National Suicide Prevention Lifeline: 757-005-3587 or TTY: 210-507-7310 TTY 725-591-3541) to talk to a trained counselor call 1-800-273-TALK (toll free, 24 hour hotline) if experiencing a Mental Health or Behavioral Health Crisis   Follow Up Plan: Telephone follow up appointment with care management team member scheduled for: 09-25-2022 at 1145 am          The patient verbalized understanding of instructions, educational materials, and care plan provided today and DECLINED offer to receive copy of patient instructions, educational materials, and care plan.  Telephone follow up appointment with care management team member scheduled for: 09-25-2022 at 1145 am

## 2022-08-21 NOTE — Chronic Care Management (AMB) (Signed)
Chronic Care Management   CCM Griffith Visit Note  08/21/2022 Name: Justin Griffith MRN: [id number] DOB: 06/08/1940  Subjective: Justin Griffith is a 82 y.o. year old male who is a primary care patient of Dorcas Carrow, DO. The patient was referred to the Chronic Care Management team for assistance with care management needs subsequent to provider initiation of CCM services and plan of care.    Today's Visit:  Engaged with patient by telephone for follow up visit.        Goals Addressed             This Visit's Progress    CCM Expected Outcome:  Monitor, Self-Manage and Reduce Symptoms of  COPD         Current Barriers:  Knowledge Deficits related to factors that may cause the patient to have an exacerbation of COPD Care Coordination needs related to cost constraints  in a patient with COPD and needing coverage help with [employer] Chronic Disease Management support and education needs related to effective management of COPD 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   Planned Interventions: Provided patient with basic written and verbal COPD education on self care/management/and exacerbation prevention. The patient denies any issues with his breathing. The patient states his COPD is stable. The patient is having elevations in his blood pressures. Sees the pcp regularly. Adjustment of medications. The patient denies any new issues with his breathing or COPD. Advised patient to track and manage COPD triggers. Review of factors that cause exacerbation. The patient states his breathing is stable right now Provided written and verbal instructions on pursed lip breathing and utilized returned demonstration as teach back Provided instruction about proper use of medications used for management of COPD including inhalers. States compliance with medications.  Advised patient to self assesses COPD action plan zone and  make appointment with provider if in the yellow zone for 48 hours without improvement Provided education about and advised patient to utilize infection prevention strategies to reduce risk of respiratory infection Discussed the importance of adequate rest and management of fatigue with COPD Screening for signs and symptoms of depression related to chronic disease state  Assessed social determinant of health barriers Pharm D referral for assistance with [facility] cost constraints. The patient is working with the pharm D.   Symptom Management: Take medications as prescribed   Attend all scheduled provider appointments Call provider office for new concerns or questions  call the Suicide and Crisis Lifeline: 988 call the [place] National Suicide Prevention Lifeline: (838) 688-0706 or TTY: (812)642-7009 TTY 605-821-4208) to talk to a trained counselor call 1-800-273-TALK (toll free, 24 hour hotline) if experiencing a Mental Health or Behavioral Health Crisis  avoid second hand smoke eliminate smoking in my home identify and remove indoor air pollutants limit outdoor activity during cold weather listen for public air quality announcements every day develop a rescue plan eliminate symptom triggers at home follow rescue plan if symptoms flare-up  Follow Up Plan: Telephone follow up appointment with care management team member scheduled for: 09-25-2022 at 1145 am       CCM Expected Outcome:  Monitor, Self-Manage and Reduce Symptoms of Diabetes       Current Barriers:  Knowledge Deficits related to the importance of good blood sugar control in a patient with DM Care Coordination needs related to ongoing education and support for management of medications and disease management  in a patient  with DM Chronic Disease Management support and education needs related to effective management of DM Lacks caregiver support.   Lab Results  Component Value Date   HGBA1C 11.5 (H) 06/02/2022     Planned  Interventions: Provided education to patient about basic DM disease process. DM is out of control again. The patient states he is taking his medications and watching what he eats. The patient sometimes does forget to take his medications. Review and education provided. Education on making sure the patient takes his medication as instructed ; Reviewed medications with patient and discussed importance of medication adherence. The patient is now using pill packs and he says this is working out well for him. Encouraged him to take his medications as prescribed and call for any questions or concerns. Denies any issues with his DM at this time. Will continue to monitor. DM is stable. Reviewed prescribed diet with patient heart healthy/ADA diet. The patient states compliance with heart healthy/Ada diet. Education given on monitoring for hidden sodium; Counseled on importance of regular laboratory monitoring as prescribed. Has regular labwork        Discussed plans with patient for ongoing care management follow up and provided patient with direct contact information for care management team;      Provided patient with written educational materials related to hypo and hyperglycemia and importance of correct treatment;       Reviewed scheduled/upcoming provider appointments including: 08-26-2022, reminder given today;         call provider for findings outside established parameters;       Review of patient status, including review of consultants reports, relevant laboratory and other test results, and medications completed;       Advised patient to discuss changes in his DM and questions or concerns with provider;      Screening for signs and symptoms of depression related to chronic disease state;        Assessed social determinant of health barriers;         Symptom Management: Take medications as prescribed   Attend all scheduled provider appointments Call provider office for new concerns or questions   call the Suicide and Crisis Lifeline: 988 call the [place] National Suicide Prevention Lifeline: 743-590-6660 or TTY: 337-858-7573 TTY (253)347-3511) to talk to a trained counselor call 1-800-273-TALK (toll free, 24 hour hotline) if experiencing a Mental Health or Behavioral Health Crisis  check feet daily for cuts, sores or redness trim toenails straight across manage portion size wash and dry feet carefully every day wear comfortable, cotton socks wear comfortable, well-fitting shoes  Follow Up Plan: Telephone follow up appointment with care management team member scheduled for: 09-25-2022 at 1145 am       CCM Expected Outcome:  Monitor, Self-Manage and Reduce Symptoms of Heart Failure       Current Barriers:  Knowledge Deficits related to the importance of monitoring for changes in water weight and effective management of HF Chronic Disease Management support and education needs related to effective management of HF Lacks caregiver support.  Wt Readings from Last 3 Encounters:  08/20/22 260 lb 3.2 oz (118 kg)  08/13/22 253 lb 9.6 oz (115 kg)  08/03/22 256 lb 6.4 oz (116.3 kg)    BP Readings from Last 3 Encounters:  08/20/22 (!) 159/70  08/13/22 136/70  08/03/22 (!) 170/70     Planned Interventions: Basic overview and discussion of pathophysiology of Heart Failure reviewed. The patients weight is up and down.  The patient has  been having elevated blood pressures. He had not taken his medications at the time of the call because he stated he was "peeing like crazy". Education on the purpose of the medication changes by Dr. Laural Benes when he was in the office on 08-20-2022 and the need to take as directed. Long standing history of non-compliance with the plan of care. He says home health PT will be out today to see him at 1 pm and the nurse was there yesterday, Justin Griffith. Education provided. Review of the instructions provided by Dr. Laural Benes on his visit on 08-20-2022. Provided education on  low sodium diet. The patient states he does not add salt to his foods. He uses a product called "no salt". The patient states that he is good about not eating foods high in sodium.  Reminder provided on making sure he follows a heart healthy/ADA diet. Review and education provided Reviewed Heart Failure Action Plan in depth and provided written copy. Home health is reporting weight changes to the pcp.  Assessed need for readable accurate scales in home. Discussed safety in weighing. The patient has a hight fall risk. Uses a walker. The patient had not taken his medication or weighed at the time of the outreach. Review of the benefits of weights and weight fluctuations determining exacerbations in HF. The patient states he is going to the bathroom and he feels fine. Denies any shortness of breath.  Provided education about placing scale on hard, flat surface Advised patient to weigh each morning after emptying bladder. The patient no longer has an indwelling catheter but he is having to go to the bathroom frequently. The patient is thankful not to have the catheter. He says he will not do that again as it was not pleasant. Education on being safe and preventing falls.  Discussed importance of daily weight and advised patient to weigh and record daily. The patient is having fluctuations of weights at this time. Education on the benefits of daily weights and monitoring for weight changes of +2/3 pounds in one day and +5 pounds in a week.  Reviewed role of diuretics in prevention of fluid overload and management of heart failure. The patient states he is taking his medications as prescribed. The patient saw the pcp yesterday, instructed to take Torsemide 40 mg every am. The patient has not taken his medications this am as he states he has been going to the bathroom frequently. Education on following the recommendations of the pcp.   Discussed the importance of keeping all appointments with provider. The patient  has a follow up with the pcp on 08-26-2022  reminder provided today.   Provided patient with education about the role of exercise in the management of heart failure Advised patient to discuss changes in his heart failure or heart health with provider Screening for signs and symptoms of depression related to chronic disease state  Assessed social determinant of health barriers  Symptom Management: Take medications as prescribed   Attend all scheduled provider appointments Call provider office for new concerns or questions  call the Suicide and Crisis Lifeline: 988 call the [place] National Suicide Prevention Lifeline: 778-302-3144 or TTY: (340)044-0805 TTY (909)263-1727) to talk to a trained counselor call 1-800-273-TALK (toll free, 24 hour hotline) if experiencing a Mental Health or Behavioral Health Crisis  call office if I gain more than 2 pounds in one day or 5 pounds in one week keep legs up while sitting use salt in moderation watch for swelling in feet, ankles and  legs every day weigh myself daily develop a rescue plan follow rescue plan if symptoms flare-up track symptoms and what helps feel better or worse dress right for the weather, hot or cold  Follow Up Plan: Telephone follow up appointment with care management team member scheduled for: 09-25-2022 at 1145 am       CCM Expected Outcome:  Monitor, Self-Manage and Reduce Symptoms of: Falls and safety concerns       Current Barriers:  Knowledge Deficits related to safety concerns and falls prevention in patient with multiple chronic conditions and history of falls with injury Care Coordination needs related to resources in the community and ongoing support in management of chronic conditions and fall prevention in a patient with history of falls with most recent being February 2024 Chronic Disease Management support and education needs related to effective management of falls prevention and safety concerns in a patient with multiple  chronic conditions Lacks caregiver support.   Planned Interventions: Provided  verbal education re: potential causes of falls and Fall prevention strategies. The patient denies any new falls and states that he is doing well. Does not want to go back to the hospital. Review of reporting new falls to the provider if they occur.  Reviewed medications and discussed potential side effects of medications such as dizziness and frequent urination. The patient states he cannot remember why he fell recently. He states that he is taking his medications. Patient no longer has the foley catheter but goes to the bathroom frequently. Review of monitoring for safety concerns and being careful  Advised patient of importance of notifying provider of falls. The patient states he had a fall "3 weeks" ago and he does not remember why he fell. He sees the pcp on 08-26-2022 for follow up. Education on reporting new falls.  Assessed for signs and symptoms of orthostatic hypotension. The patient is having elevation in blood pressures. Denies any headaches.  Assessed for falls since last encounter. Last fall occurred "3 weeks ago" per the patient. Education and fall prevention reviewed. Last fall reported was in February 2024. Denies any new falls. Assessed patients knowledge of fall risk prevention secondary to previously provided education Provided patient information for fall alert systems Assessed working status of life alert bracelet and patient adherence Advised patient to discuss changes in mobility and new falls with provider. Patient will have a follow up with the pcp on 07-21-2022. The patient wants [facility] to come back in his home and work with him. He states that was helpful. Will let Dr. Laural Benes know of the patients request.  Review of discussing changes in his condition with the pcp. The patient has a history of high fall risk. Education provided. Iantha Fallen is in the home working with the patient. No new concerns at this  time with falls and safety concerns.  Screening for signs and symptoms of depression related to chronic disease state Assessed social determinant of health barriers  Symptom Management: Take medications as prescribed   Attend all scheduled provider appointments Call provider office for new concerns or questions  call the Suicide and Crisis Lifeline: 988 call the [place] National Suicide Prevention Lifeline: (856) 468-2058 or TTY: 773-370-5864 TTY 782-364-7723) to talk to a trained counselor call 1-800-273-TALK (toll free, 24 hour hotline) if experiencing a Mental Health or Behavioral Health Crisis   Follow Up Plan: Telephone follow up appointment with care management team member scheduled for: 09-25-2022 at 1145 am          Plan:Telephone  follow up appointment with care management team member scheduled for:  09-25-2022 at 1145 am  Alto Denver Griffith, Justin Griffith, CCM Griffith Care Manager  Chronic Care Management Direct Number: [phone]

## 2022-08-24 ENCOUNTER — Telehealth: Payer: Self-pay | Admitting: Family Medicine

## 2022-08-24 DIAGNOSIS — E1140 Type 2 diabetes mellitus with diabetic neuropathy, unspecified: Secondary | ICD-10-CM | POA: Diagnosis not present

## 2022-08-24 DIAGNOSIS — Z794 Long term (current) use of insulin: Secondary | ICD-10-CM | POA: Diagnosis not present

## 2022-08-24 DIAGNOSIS — B351 Tinea unguium: Secondary | ICD-10-CM | POA: Diagnosis not present

## 2022-08-24 NOTE — Telephone Encounter (Signed)
Justin Griffith from [facility] called to report that his weight is 216 lbs, legs are swollen, left arm is swollen, cannot even wear his watch, increased shortness of breath  She asked him if he is taking his torsemide, but he told her that he is urinating often and is very frustrated because he only got 30 minutes of sleep.   Best contact: [phone]

## 2022-08-26 ENCOUNTER — Encounter: Payer: Self-pay | Admitting: Family Medicine

## 2022-08-26 ENCOUNTER — Ambulatory Visit (INDEPENDENT_AMBULATORY_CARE_PROVIDER_SITE_OTHER): Payer: PPO | Admitting: Family Medicine

## 2022-08-26 VITALS — BP 156/68 | HR 82 | Temp 98.4°F | Wt 262.0 lb

## 2022-08-26 DIAGNOSIS — Z794 Long term (current) use of insulin: Secondary | ICD-10-CM | POA: Diagnosis not present

## 2022-08-26 DIAGNOSIS — I129 Hypertensive chronic kidney disease with stage 1 through stage 4 chronic kidney disease, or unspecified chronic kidney disease: Secondary | ICD-10-CM | POA: Diagnosis not present

## 2022-08-26 DIAGNOSIS — I5032 Chronic diastolic (congestive) heart failure: Secondary | ICD-10-CM | POA: Diagnosis not present

## 2022-08-26 DIAGNOSIS — E1165 Type 2 diabetes mellitus with hyperglycemia: Secondary | ICD-10-CM | POA: Diagnosis not present

## 2022-08-26 LAB — BAYER DCA HB A1C WAIVED: HB A1C (BAYER DCA - WAIVED): 9 % — ABNORMAL HIGH (ref 4.8–5.6)

## 2022-08-26 MED ORDER — TORSEMIDE 40 MG PO TABS: 40.0000 mg | ORAL_TABLET | Freq: Every morning | ORAL | 1 refills | Status: DC

## 2022-08-26 MED ORDER — TORSEMIDE 20 MG PO TABS: 40.0000 mg | ORAL_TABLET | Freq: Every morning | ORAL | 1 refills | Status: DC

## 2022-08-26 NOTE — Assessment & Plan Note (Signed)
Doing much better. Will start his 10mg  on amlodipine tomorrow. Recheck next week. Call with any concerns.

## 2022-08-26 NOTE — Progress Notes (Signed)
BP (!) 156/68 (BP Location: Left Arm, Cuff Size: Normal)   Pulse 82   Temp 98.4 F (36.9 C) (Oral)   Wt 262 lb (118.8 kg)   SpO2 96%   BMI 37.59 kg/m    Subjective:    Patient ID: Justin Griffith, male    DOB: 1940/08/01, 82 y.o.   MRN: [id number]  HPI: Justin Griffith is a 82 y.o. male  Chief Complaint  Patient presents with   Congestive Heart Failure   Hypertension   Has been able to sleep most of the night while taking his torsemide in the AM rather than BID. He continues with some swelling, but it's looking better. Still has some rales. No SOB. Sees cardiology on Monday.   HYPERTENSION- has not picked up his new pill pack with the higher dose of amlodipine yet.  Hypertension status: better  Satisfied with current treatment? no Duration of hypertension: chronic BP monitoring frequency:  a few times a week BP medication side effects:  no Medication compliance: good compliance Aspirin: no Recurrent headaches: no Visual changes: no Palpitations: no Dyspnea: no Chest pain: no Lower extremity edema: yes Dizzy/lightheaded: no  DIABETES Hypoglycemic episodes:no Polydipsia/polyuria: yes Visual disturbance: no Chest pain: no Paresthesias: yes Glucose Monitoring: no Taking Insulin?: no Blood Pressure Monitoring: a few times a week Retinal Examination: Up to Date Foot Exam: Up to Date Diabetic Education: Completed Pneumovax: Up to Date Influenza: Up to Date Aspirin: no   Relevant past medical, surgical, family and social history reviewed and updated as indicated. Interim medical history since our last visit reviewed. Allergies and medications reviewed and updated.  Review of Systems  Constitutional: Negative.   Respiratory: Negative.    Cardiovascular: Negative.   Gastrointestinal: Negative.   Musculoskeletal: Negative.   Neurological: Negative.   Psychiatric/Behavioral: Negative.      Per HPI unless specifically indicated above     Objective:    BP  (!) 156/68 (BP Location: Left Arm, Cuff Size: Normal)   Pulse 82   Temp 98.4 F (36.9 C) (Oral)   Wt 262 lb (118.8 kg)   SpO2 96%   BMI 37.59 kg/m   Wt Readings from Last 3 Encounters:  08/26/22 262 lb (118.8 kg)  08/20/22 260 lb 3.2 oz (118 kg)  08/13/22 253 lb 9.6 oz (115 kg)    Physical Exam Vitals and nursing note reviewed.  Constitutional:      General: He is not in acute distress.    Appearance: Normal appearance. He is obese. He is not ill-appearing, toxic-appearing or diaphoretic.  HENT:     Head: Normocephalic and atraumatic.     Right Ear: External ear normal.     Left Ear: External ear normal.     Nose: Nose normal.     Mouth/Throat:     Mouth: Mucous membranes are moist.     Pharynx: Oropharynx is clear.  Eyes:     General: No scleral icterus.       Right eye: No discharge.        Left eye: No discharge.     Extraocular Movements: Extraocular movements intact.     Conjunctiva/sclera: Conjunctivae normal.     Pupils: Pupils are equal, round, and reactive to light.  Cardiovascular:     Rate and Rhythm: Normal rate and regular rhythm.     Pulses: Normal pulses.     Heart sounds: Normal heart sounds. No murmur heard.    No friction rub. No gallop.  Pulmonary:  Effort: Pulmonary effort is normal. No respiratory distress.     Breath sounds: No stridor. Rales (bilateral bases) present. No wheezing or rhonchi.  Chest:     Chest wall: No tenderness.  Musculoskeletal:        General: Normal range of motion.     Cervical back: Normal range of motion and neck supple.     Right lower leg: Edema (2+) present.     Left lower leg: Edema (2+) present.  Skin:    General: Skin is warm and dry.     Capillary Refill: Capillary refill takes less than 2 seconds.     Coloration: Skin is not jaundiced or pale.     Findings: No bruising, erythema, lesion or rash.  Neurological:     General: No focal deficit present.     Mental Status: He is alert and oriented to person,  place, and time. Mental status is at baseline.  Psychiatric:        Mood and Affect: Mood normal.        Behavior: Behavior normal.        Thought Content: Thought content normal.        Judgment: Judgment normal.     Results for orders placed or performed in visit on 08/14/22  HM DIABETES EYE EXAM  Result Value Ref Range   HM Diabetic Eye Exam Retinopathy (A) No Retinopathy      Assessment & Plan:   Problem List Items Addressed This Visit       Cardiovascular and Mediastinum   Chronic diastolic CHF (congestive heart failure) (HCC)    Has restarted his torsemide daily due to SOB and increased swelling. Was having increased problem with peeing at night- we changed his torsemide to 40mg  in the AM. He has been back on this for 5 days and swelling is significantly better. Slight rales on exam today. Continue 40mg  in the AM- we got it back into pill pack to help with compliance. Seeing cardiology on Monday. We will send them this note to let them know what's going on. ? Referral to CHF clinic to help avoid hospitalizations in future? Will discuss at follow up.      Relevant Medications   torsemide (DEMADEX) 20 MG tablet     Endocrine   Uncontrolled type 2 diabetes mellitus with hyperglycemia, with long-term current use of insulin (HCC) - Primary    Doing much better with A1c of 9.0 down from 11.5. Has been off and re-going up on his rybelsus. Will recheck again in 3 months. Continue current regimen. Call with any concerns.       Relevant Orders   Bayer DCA Hb A1c Waived     Genitourinary   Benign hypertensive renal disease    Doing much better. Will start his 10mg  on amlodipine tomorrow. Recheck next week. Call with any concerns.         Follow up plan: Return Thursday next week.

## 2022-08-26 NOTE — Assessment & Plan Note (Signed)
Doing much better with A1c of 9.0 down from 11.5. Has been off and re-going up on his rybelsus. Will recheck again in 3 months. Continue current regimen. Call with any concerns.

## 2022-08-26 NOTE — Assessment & Plan Note (Signed)
Has restarted his torsemide daily due to SOB and increased swelling. Was having increased problem with peeing at night- we changed his torsemide to 40mg  in the AM. He has been back on this for 5 days and swelling is significantly better. Slight rales on exam today. Continue 40mg  in the AM- we got it back into pill pack to help with compliance. Seeing cardiology on Monday. We will send them this note to let them know what's going on. ? Referral to CHF clinic to help avoid hospitalizations in future? Will discuss at follow up.

## 2022-08-28 DIAGNOSIS — I509 Heart failure, unspecified: Secondary | ICD-10-CM | POA: Diagnosis not present

## 2022-08-28 DIAGNOSIS — I69351 Hemiplegia and hemiparesis following cerebral infarction affecting right dominant side: Secondary | ICD-10-CM | POA: Diagnosis not present

## 2022-08-28 DIAGNOSIS — J449 Chronic obstructive pulmonary disease, unspecified: Secondary | ICD-10-CM | POA: Diagnosis not present

## 2022-08-31 ENCOUNTER — Telehealth: Payer: Self-pay | Admitting: Family Medicine

## 2022-08-31 NOTE — Telephone Encounter (Signed)
Copied from CRM [id number]. Topic: Medicare AWV >> Aug 31, 2022  1:33 PM [name] wrote: Reason for CRM: N/A 08/31/2022 FOR AWV - NO VOICEMAIL  [name]; Care Guide Ambulatory Clinical Support [facility] Direct Dial: [phone]

## 2022-09-01 NOTE — Telephone Encounter (Signed)
I received provider and pt pages. And Submitted application for [employer] to [employer]) for patient assistance.   Phone: [phone]  Georga Bora [employer] Patient Advocate (O) [phone]

## 2022-09-02 ENCOUNTER — Telehealth: Payer: Self-pay | Admitting: Family Medicine

## 2022-09-02 NOTE — Telephone Encounter (Signed)
265.6 5 lb gain on 08/26/2022. Corrie Dandy from [facility] [phone] Did not take torsemide because he has visitors, he missed two doses.

## 2022-09-03 ENCOUNTER — Encounter: Payer: Self-pay | Admitting: Family Medicine

## 2022-09-03 ENCOUNTER — Telehealth: Payer: Self-pay

## 2022-09-03 ENCOUNTER — Ambulatory Visit (INDEPENDENT_AMBULATORY_CARE_PROVIDER_SITE_OTHER): Payer: PPO | Admitting: Family Medicine

## 2022-09-03 VITALS — BP 138/68 | HR 76 | Temp 97.8°F | Ht 70.0 in | Wt 269.6 lb

## 2022-09-03 DIAGNOSIS — Z0000 Encounter for general adult medical examination without abnormal findings: Secondary | ICD-10-CM | POA: Diagnosis not present

## 2022-09-03 DIAGNOSIS — I5032 Chronic diastolic (congestive) heart failure: Secondary | ICD-10-CM | POA: Diagnosis not present

## 2022-09-03 NOTE — Telephone Encounter (Unsigned)
Copied from CRM [id number]. Topic: General - Other >> Sep 03, 2022  4:07 PM Carrielelia G wrote: Due to insurance his last visit will be today, he will go to [name] appt on 6/17 and will see if he can get more therapy visits. Also. Patients weight is up to 266 lbs

## 2022-09-03 NOTE — Progress Notes (Signed)
BP 138/68   Pulse 76   Temp 97.8 F (36.6 C) (Oral)   Ht 5\' 10"  (1.778 m)   Wt 269 lb 9.6 oz (122.3 kg)   SpO2 98%   BMI 38.68 kg/m    Subjective:    Patient ID: Justin Griffith, male    DOB: 04-05-1940, 82 y.o.   MRN: [id number]  HPI: Justin Griffith is a 82 y.o. male presenting on 09/03/2022 for comprehensive medical examination. Current medical complaints include:  Feeling better. Not SOB anymore. Skipped 2 days of his torsemide when his sister came to visit. Still having some swelling in his legs, but it's much better than it was. No redness, no pain. He notes that his peeing has gotten better, not getting up as often at night. He is sleeping better. No other concerns or complaints at this time.   He currently lives with: wife Interim Problems from his last visit: no  Functional Status Survey: Is the patient deaf or have difficulty hearing?: Yes Does the patient have difficulty seeing, even when wearing glasses/contacts?: No Does the patient have difficulty concentrating, remembering, or making decisions?: No Does the patient have difficulty walking or climbing stairs?: Yes Does the patient have difficulty dressing or bathing?: No Does the patient have difficulty doing errands alone such as visiting a doctor's office or shopping?: Yes  FALL RISK:    09/03/2022   11:01 AM 07/15/2022    1:38 PM 05/11/2022   12:13 PM 04/03/2022    2:38 PM 03/03/2022    1:54 PM  Fall Risk   Falls in the past year? 1 1  1 1   Number falls in past yr: 1 1 0 1 1  Injury with Fall? 1 1 1 1 1   Risk for fall due to : Impaired balance/gait;History of fall(s) History of fall(s);Impaired balance/gait;Impaired mobility History of fall(s);Impaired balance/gait;Impaired mobility;Other (Comment) History of fall(s);Impaired balance/gait History of fall(s);Impaired balance/gait;Impaired mobility  Risk for fall due to: Comment   most recent fall < a month ago    Follow up  Falls evaluation completed;Education  provided;Falls prevention discussed;Follow up appointment Falls evaluation completed;Education provided;Falls prevention discussed;Follow up appointment Falls evaluation completed Falls evaluation completed;Education provided;Falls prevention discussed;Follow up appointment  Comment     the patient saw the pcp after his fall on 03-02-2022 and will see again on 03-13-2022    Depression Screen    09/03/2022   11:02 AM 04/03/2022    2:37 PM 02/06/2022    8:09 AM 09/01/2021   11:39 AM 07/08/2021    1:28 PM  Depression screen PHQ 2/9  Decreased Interest 0 3 1 1  0  Down, Depressed, Hopeless 2 3 2 1  0  PHQ - 2 Score 2 6 3 2  0  Altered sleeping 0 2 1 0 0  Tired, decreased energy 1 3 1  0 0  Change in appetite 0 0 1 0 0  Feeling bad or failure about yourself  1 0 2 0 0  Trouble concentrating 0 0 0 0 0  Moving slowly or fidgety/restless 0 2 2 0 0  Suicidal thoughts 0 0 0  0  PHQ-9 Score 4 13 10 2  0  Difficult doing work/chores Not difficult at all Somewhat difficult Somewhat difficult Not difficult at all     Advanced Directives Does patient have a HCPOA?    no Does patient have a living will or MOST form?  no  Past Medical History:  Past Medical History:  Diagnosis Date  Acute urinary retention    Arthritis    Asthma 2000   Back pain    Colon polyp 2012   COPD (chronic obstructive pulmonary disease) (HCC)    Emphysema of lung (HCC)    Heart murmur    Hernia 2000   History of cold sores 05/06/2020   History of colon cancer    Hyperlipidemia    Hypertension    Personal history of malignant neoplasm of large intestine    Personal history of tobacco use, presenting hazards to health    Septic shock (HCC) 11/18/2021   Sleep apnea    Special screening for malignant neoplasms, colon    Type 2 diabetes mellitus with proteinuria (HCC) 10/11/2014    Surgical History:  Past Surgical History:  Procedure Laterality Date   CARDIAC CATHETERIZATION Left 09/25/2015   Procedure: Left Heart  Cath and Coronary Angiography;  Surgeon: Alwyn Pea, MD;  Location: [facility];  Service: Cardiovascular;  Laterality: Left;   CHOLECYSTECTOMY  1970   COLON SURGERY  07-16-99   sigmoid colon resection with primary anastomosis, chemotherapy for metastatic disease   COLONOSCOPY  2001, 2012   Dr Lemar Livings, tubular adenoma of the cecum and ascending colon in 2012.   COLONOSCOPY WITH PROPOFOL N/A 02/12/2016   Procedure: COLONOSCOPY WITH PROPOFOL;  Surgeon: Earline Mayotte, MD;  Location: [facility] ENDOSCOPY;  Service: Endoscopy;  Laterality: N/A;   CORONARY STENT INTERVENTION N/A 05/17/2020   Procedure: CORONARY STENT INTERVENTION;  Surgeon: Alwyn Pea, MD;  Location: [facility];  Service: Cardiovascular;  Laterality: N/A;  LAD   ESOPHAGOGASTRODUODENOSCOPY (EGD) WITH PROPOFOL N/A 02/12/2016   Procedure: ESOPHAGOGASTRODUODENOSCOPY (EGD) WITH PROPOFOL;  Surgeon: Earline Mayotte, MD;  Location: [facility] ENDOSCOPY;  Service: Endoscopy;  Laterality: N/A;   HERNIA REPAIR Right    right inguinal hernia repair   IR 3D INDEPENDENT WKST  05/07/2022   IR ANGIOGRAM PELVIS SELECTIVE OR SUPRASELECTIVE  05/07/2022   IR ANGIOGRAM PELVIS SELECTIVE OR SUPRASELECTIVE  05/07/2022   IR ANGIOGRAM SELECTIVE EACH ADDITIONAL VESSEL  05/07/2022   IR EMBO ARTERIAL NOT HEMORR HEMANG INC GUIDE ROADMAPPING  05/07/2022   IR RADIOLOGIST EVAL & MGMT  02/27/2022   IR RADIOLOGIST EVAL & MGMT  03/25/2022   IR RADIOLOGIST EVAL & MGMT  06/01/2022   IR RADIOLOGIST EVAL & MGMT  08/18/2022   IR US GUIDE VASC ACCESS LEFT  05/07/2022   JOINT REPLACEMENT Bilateral    knee replacement   KNEE SURGERY Bilateral 2010   LEFT HEART CATH AND CORONARY ANGIOGRAPHY N/A 05/17/2020   Procedure: LEFT HEART CATH AND CORONARY ANGIOGRAPHY possible PCI and stent;  Surgeon: Alwyn Pea, MD;  Location: [facility];  Service: Cardiovascular;  Laterality: N/A;   MYRINGOTOMY WITH TUBE PLACEMENT Bilateral 08/16/2014   Procedure:  MYRINGOTOMY WITH TUBE PLACEMENT;  Surgeon: Bud Face, MD;  Location: [facility] ORS;  Service: ENT;  Laterality: Bilateral;   PERIPHERAL VASCULAR CATHETERIZATION Right 09/04/2015   Procedure: Lower Extremity Angiography;  Surgeon: Renford Dills, MD;  Location: [facility];  Service: Cardiovascular;  Laterality: Right;   PERIPHERAL VASCULAR CATHETERIZATION  09/04/2015   Procedure: Lower Extremity Intervention;  Surgeon: Renford Dills, MD;  Location: [facility];  Service: Cardiovascular;;   TONSILLECTOMY     TYMPANOSTOMY TUBE PLACEMENT      Medications:  Current Outpatient Medications on File Prior to Visit  Medication Sig   amLODipine (NORVASC) 10 MG tablet Take 1 tablet (10  mg total) by mouth daily.   apixaban (ELIQUIS) 2.5 MG TABS tablet Take 1 tablet (2.5 mg total) by mouth 2 (two) times daily.   apixaban (ELIQUIS) 5 MG TABS tablet 1/2 tab BID x 30 days   Blood Glucose Monitoring Suppl (CONTOUR NEXT EZ) w/Device KIT Use to check blood glucose daily E11.65 Type 2 Diabetes   Blood Pressure Monitor DEVI Use to monitor blood pressure daily   cloNIDine (CATAPRES) 0.1 MG tablet TAKE 1 TABLET BY MOUTH TWICE DAILY FOR BLOOD PRESSURE   gabapentin (NEURONTIN) 400 MG capsule Take 1 capsule (400 mg total) by mouth 3 (three) times daily. For nerve pain and itching   glucose blood (CONTOUR NEXT TEST) test strip Use to check blood glucose once daily E11.65 Type 2 Diabetes   GNP VITAMIN C 500 MG tablet TAKE 1 TABLET BY MOUTH ONCE DAILY TO KEEP YOUR IMMUNE SYSTEM UP   hydrALAZINE (APRESOLINE) 100 MG tablet Take 1 tablet (100 mg total) by mouth 3 (three) times daily. for blood pressure   losartan (COZAAR) 100 MG tablet Take 1 tablet (100 mg total) by mouth daily. For blood pressure   metFORMIN (GLUCOPHAGE) 500 MG tablet Take 1 tablet (500 mg total) by mouth 2 (two) times daily with a meal.   Microlet Lancets MISC Use to check blood glucose daily E11.65 Type 2 Diabetes   nystatin  (MYCOSTATIN/NYSTOP) powder Apply 1 Application topically 3 (three) times daily.   nystatin ointment (MYCOSTATIN) Apply 1 Application topically 2 (two) times daily.   Olopatadine HCl 0.2 % SOLN Apply 1 drop to eye daily.   omeprazole (PRILOSEC) 40 MG capsule TAKE 1 CAPSULE BY MOUTH ONCE DAILY FOR HEARTBURN   polyethylene glycol powder (GLYCOLAX/MIRALAX) 17 GM/SCOOP powder Take 17 g by mouth 2 (two) times daily as needed.   rosuvastatin (CRESTOR) 10 MG tablet Take 1 tablet (10 mg total) by mouth at bedtime. For cholesterol   [START ON 09/20/2022] Semaglutide (RYBELSUS) 14 MG TABS 1/2 tab daily for 30 days   Semaglutide (RYBELSUS) 14 MG TABS Take 1 tablet (14 mg total) by mouth daily.   torsemide (DEMADEX) 20 MG tablet Take 2 tablets (40 mg total) by mouth every morning.   umeclidinium bromide (INCRUSE ELLIPTA) 62.5 MCG/ACT AEPB Inhale 1 puff into the lungs daily. For your breathing   acyclovir (ZOVIRAX) 400 MG tablet Take 1 tablet (400 mg total) by mouth 2 (two) times daily. (Patient not taking: Reported on 07/24/2022)   blood glucose meter kit and supplies 1 each by Other route daily. Dispense based on patient and insurance preference. Use up to four times daily as directed. (FOR ICD-10 E10.9, E11.9). (Patient not taking: Reported on 09/03/2022)   Current Facility-Administered Medications on File Prior to Visit  Medication   cyanocobalamin (VITAMIN B12) injection 1,000 mcg    Allergies:  Allergies  Allergen Reactions   Farxiga [Dapagliflozin] Rash    Causes a severe rash in groin area    Dulaglutide Diarrhea and Other (See Comments)   Liraglutide Diarrhea and Other (See Comments)   Jardiance [Empagliflozin] Rash    Social History:  Social History   Socioeconomic History   Marital status: Married    Spouse name: Not on file   Number of children: Not on file   Years of education: Not on file   Highest education level: GED or equivalent  Occupational History   Occupation: retired     Comment: [employer]  Tobacco Use   Smoking status: Former    Packs/day: 1.00  Years: 30.00    Additional pack years: 0.00    Total pack years: 30.00    Types: Cigarettes    Quit date: 03/23/1989    Years since quitting: 33.4    Passive exposure: Past   Smokeless tobacco: Never  Vaping Use   Vaping Use: Never used  Substance and Sexual Activity   Alcohol use: No    Alcohol/week: 0.0 standard drinks of alcohol   Drug use: No   Sexual activity: Not Currently  Other Topics Concern   Not on file  Social History Narrative   [employer]    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    Social Determinants of Health   Financial Resource Strain: Low Risk  (03/03/2022)   Overall Financial Resource Strain (CARDIA)    Difficulty of Paying Living Expenses: Not hard at all  Food Insecurity: No Food Insecurity (06/23/2022)   Hunger Vital Sign    Worried About Running Out of Food in the Last Year: Never true    Ran Out of Food in the Last Year: Never true  Transportation Needs: No Transportation Needs (06/23/2022)   PRAPARE - Administrator, Civil Service (Medical): No    Lack of Transportation (Non-Medical): No  Physical Activity: Inactive (03/03/2022)   Exercise Vital Sign    Days of Exercise per Week: 0 days    Minutes of Exercise per Session: 0 min  Stress: No Stress Concern Present (03/03/2022)   [employer] of Occupational Health - Occupational Stress Questionnaire    Feeling of Stress : Only a little  Social Connections: Moderately Integrated (03/03/2022)   Social Connection and Isolation Panel [NHANES]    Frequency of Communication with Friends and Family: More than three times a week    Frequency of Social Gatherings with Friends and Family: More than three times a week    Attends Religious Services: More than 4 times per year    Active Member of [employer] or Organizations: No    Attends Banker Meetings: Never    Marital Status: Married  Catering manager Violence: Not At Risk  (06/13/2022)   Humiliation, Afraid, Rape, and Kick questionnaire    Fear of Current or Ex-Partner: No    Emotionally Abused: No    Physically Abused: No    Sexually Abused: No   Social History   Tobacco Use  Smoking Status Former   Packs/day: 1.00   Years: 30.00   Additional pack years: 0.00   Total pack years: 30.00   Types: Cigarettes   Quit date: 03/23/1989   Years since quitting: 33.4   Passive exposure: Past  Smokeless Tobacco Never   Social History   Substance and Sexual Activity  Alcohol Use No   Alcohol/week: 0.0 standard drinks of alcohol    Family History:  Family History  Problem Relation Age of Onset   Diabetes Father    Esophageal cancer Mother    Alzheimer's disease Paternal Uncle     Past medical history, surgical history, medications, allergies, family history and social history reviewed with patient today and changes made to appropriate areas of the chart.   Review of Systems  Constitutional: Negative.   Respiratory: Negative.    Cardiovascular: Negative.   Musculoskeletal: Negative.   Skin: Negative.   Neurological: Negative.   Psychiatric/Behavioral: Negative.     All other ROS negative except what is listed above and in the HPI.      Objective:    BP 138/68   Pulse 76  Temp 97.8 F (36.6 C) (Oral)   Ht 5\' 10"  (1.778 m)   Wt 269 lb 9.6 oz (122.3 kg)   SpO2 98%   BMI 38.68 kg/m   Wt Readings from Last 3 Encounters:  09/03/22 269 lb 9.6 oz (122.3 kg)  08/26/22 262 lb (118.8 kg)  08/20/22 260 lb 3.2 oz (118 kg)     Physical Exam Vitals and nursing note reviewed.  Constitutional:      General: He is not in acute distress.    Appearance: Normal appearance. He is obese. He is not ill-appearing, toxic-appearing or diaphoretic.  HENT:     Head: Normocephalic and atraumatic.     Right Ear: External ear normal.     Left Ear: External ear normal.     Nose: Nose normal.     Mouth/Throat:     Mouth: Mucous membranes are moist.      Pharynx: Oropharynx is clear.  Eyes:     General: No scleral icterus.       Right eye: No discharge.        Left eye: No discharge.     Extraocular Movements: Extraocular movements intact.     Conjunctiva/sclera: Conjunctivae normal.     Pupils: Pupils are equal, round, and reactive to light.  Cardiovascular:     Rate and Rhythm: Normal rate and regular rhythm.     Pulses: Normal pulses.     Heart sounds: Murmur heard.     No friction rub. No gallop.  Pulmonary:     Effort: Pulmonary effort is normal. No respiratory distress.     Breath sounds: Normal breath sounds. No stridor. No wheezing, rhonchi or rales.  Chest:     Chest wall: No tenderness.  Musculoskeletal:        General: Normal range of motion.     Cervical back: Normal range of motion and neck supple.     Right lower leg: Edema (1+) present.     Left lower leg: Edema (1+) present.  Skin:    General: Skin is warm and dry.     Capillary Refill: Capillary refill takes less than 2 seconds.     Coloration: Skin is not jaundiced or pale.     Findings: No bruising, erythema, lesion or rash.  Neurological:     General: No focal deficit present.     Mental Status: He is alert and oriented to person, place, and time. Mental status is at baseline.  Psychiatric:        Mood and Affect: Mood normal.        Behavior: Behavior normal.        Thought Content: Thought content normal.        Judgment: Judgment normal.        09/03/2022   11:04 AM 09/01/2021   11:37 AM 05/08/2020    7:09 PM 01/01/2020   11:14 AM 08/22/2018    2:11 PM  6CIT Screen  What Year? 4 points 0 points 0 points 4 points 0 points  What month? 0 points 0 points 0 points 0 points 0 points  What time? 0 points 0 points 0 points 0 points 0 points  Count back from 20 0 points 0 points 2 points 0 points 0 points  Months in reverse 0 points 4 points 2 points 0 points 0 points  Repeat phrase 6 points 6 points 8 points 8 points 0 points  Total Score 10 points 10  points 12 points 12 points 0  points     Results for orders placed or performed in visit on 08/26/22  Bayer DCA Hb A1c Waived  Result Value Ref Range   HB A1C (BAYER DCA - WAIVED) 9.0 (H) 4.8 - 5.6 %      Assessment & Plan:   Problem List Items Addressed This Visit       Cardiovascular and Mediastinum   Chronic diastolic CHF (congestive heart failure) (HCC)    Improving. Continue current regimen. Follow up with cardiology in 1 week as scheduled. Continue to monitor.       Relevant Orders   Basic metabolic panel   Other Visit Diagnoses     Encounter for annual wellness exam in Medicare patient    -  Primary   Preventative care discussed today as below.        Preventative Services:  Health Risk Assessment and Personalized Prevention Plan: Done today Bone Mass Measurements: N/A CVD Screening: up to date Colon Cancer Screening: N/A Depression Screening: Done today Diabetes Screening: up to date Glaucoma Screening: See your eye doctor Hepatitis B vaccine: N/a Hepatitis C screening: up to date HIV Screening: up to date Flu Vaccine: get in the fall Lung cancer Screening: N/A Obesity Screening: Done today Pneumonia Vaccines (2): Up to date STI Screening: N/A PSA screening: N/A  Discussed aspirin prophylaxis for myocardial infarction prevention and decision was it was not indicated  LABORATORY TESTING:  Health maintenance labs ordered today as discussed above.   IMMUNIZATIONS:   - Tdap: Tetanus vaccination status reviewed: last tetanus booster within 10 years. - Influenza: Postponed to flu season - Pneumovax: Up to date - Prevnar: Up to date - Zostavax vaccine: Not applicable  SCREENING: - Colonoscopy: Not applicable  Discussed with patient purpose of the colonoscopy is to detect colon cancer at curable precancerous or early stages   PATIENT COUNSELING:    Sexuality: Discussed sexually transmitted diseases, partner selection, use of condoms, avoidance of  unintended pregnancy  and contraceptive alternatives.   Advised to avoid cigarette smoking.  I discussed with the patient that most people either abstain from alcohol or drink within safe limits (<=14/week and <=4 drinks/occasion for males, <=7/weeks and <= 3 drinks/occasion for females) and that the risk for alcohol disorders and other health effects rises proportionally with the number of drinks per week and how often a drinker exceeds daily limits.  Discussed cessation/primary prevention of drug use and availability of treatment for abuse.   Diet: Encouraged to adjust caloric intake to maintain  or achieve ideal body weight, to reduce intake of dietary saturated fat and total fat, to limit sodium intake by avoiding high sodium foods and not adding table salt, and to maintain adequate dietary potassium and calcium preferably from fresh fruits, vegetables, and low-fat dairy products.    stressed the importance of regular exercise  Injury prevention: Discussed safety belts, safety helmets, smoke detector, smoking near bedding or upholstery.   Dental health: Discussed importance of regular tooth brushing, flossing, and dental visits.   Follow up plan: NEXT PREVENTATIVE PHYSICAL DUE IN 1 YEAR. Return in about 1 week (around 09/10/2022).

## 2022-09-03 NOTE — Telephone Encounter (Signed)
Spoke with [employer] and was inform patient will be seeing Dr.Umeh with [facility]. Letter has been faxed to provided fax contact information at [phone] to ATTN: Fleet Contras.

## 2022-09-03 NOTE — Patient Instructions (Signed)
Preventative Services:  Health Risk Assessment and Personalized Prevention Plan: Done today Bone Mass Measurements: N/A CVD Screening: up to date Colon Cancer Screening: N/A Depression Screening: Done today Diabetes Screening: up to date Glaucoma Screening: See your eye doctor Hepatitis B vaccine: N/a Hepatitis C screening: up to date HIV Screening: up to date Flu Vaccine: get in the fall Lung cancer Screening: N/A Obesity Screening: Done today Pneumonia Vaccines (2): Up to date STI Screening: N/A PSA screening: N/A

## 2022-09-03 NOTE — Telephone Encounter (Signed)
noted 

## 2022-09-03 NOTE — Assessment & Plan Note (Signed)
Improving. Continue current regimen. Follow up with cardiology in 1 week as scheduled. Continue to monitor.

## 2022-09-03 NOTE — Telephone Encounter (Signed)
-----   Message from Dorcas Carrow, [place] sent at 09/03/2022 11:28 AM EDT ----- Can we please call over to [facility] and see who he is seeing on Monday so we can fax a letter over to them. Thanks!

## 2022-09-04 LAB — BASIC METABOLIC PANEL
BUN/Creatinine Ratio: 15 (ref 10–24)
BUN: 23 mg/dL (ref 8–27)
CO2: 22 mmol/L (ref 20–29)
Calcium: 9.4 mg/dL (ref 8.6–10.2)
Chloride: 95 mmol/L — ABNORMAL LOW (ref 96–106)
Creatinine, Ser: 1.57 mg/dL — ABNORMAL HIGH (ref 0.76–1.27)
Glucose: 294 mg/dL — ABNORMAL HIGH (ref 70–99)
Potassium: 5.5 mmol/L — ABNORMAL HIGH (ref 3.5–5.2)
Sodium: 130 mmol/L — ABNORMAL LOW (ref 134–144)
eGFR: 44 mL/min/{1.73_m2} — ABNORMAL LOW (ref >59)

## 2022-09-07 ENCOUNTER — Telehealth: Payer: Self-pay

## 2022-09-07 ENCOUNTER — Other Ambulatory Visit: Payer: Self-pay | Admitting: Family Medicine

## 2022-09-07 MED ORDER — APIXABAN 5 MG PO TABS: ORAL_TABLET | ORAL | 0 refills | Status: DC

## 2022-09-07 NOTE — Telephone Encounter (Signed)
Mandi from [facility] reached out to Dr Laural Benes requesting a new prescription for Eliquis 5 MG dosage for the patient's new pill pack before she can start working on it for the patient. Please advise?

## 2022-09-09 DIAGNOSIS — I152 Hypertension secondary to endocrine disorders: Secondary | ICD-10-CM | POA: Diagnosis not present

## 2022-09-09 DIAGNOSIS — R0602 Shortness of breath: Secondary | ICD-10-CM | POA: Diagnosis not present

## 2022-09-09 DIAGNOSIS — Z86711 Personal history of pulmonary embolism: Secondary | ICD-10-CM | POA: Diagnosis not present

## 2022-09-09 DIAGNOSIS — Z794 Long term (current) use of insulin: Secondary | ICD-10-CM | POA: Diagnosis not present

## 2022-09-09 DIAGNOSIS — E1159 Type 2 diabetes mellitus with other circulatory complications: Secondary | ICD-10-CM | POA: Diagnosis not present

## 2022-09-09 DIAGNOSIS — E669 Obesity, unspecified: Secondary | ICD-10-CM | POA: Diagnosis not present

## 2022-09-09 DIAGNOSIS — E1142 Type 2 diabetes mellitus with diabetic polyneuropathy: Secondary | ICD-10-CM | POA: Diagnosis not present

## 2022-09-09 DIAGNOSIS — G4733 Obstructive sleep apnea (adult) (pediatric): Secondary | ICD-10-CM | POA: Diagnosis not present

## 2022-09-09 DIAGNOSIS — J449 Chronic obstructive pulmonary disease, unspecified: Secondary | ICD-10-CM | POA: Diagnosis not present

## 2022-09-09 DIAGNOSIS — I252 Old myocardial infarction: Secondary | ICD-10-CM | POA: Diagnosis not present

## 2022-09-09 DIAGNOSIS — I2510 Atherosclerotic heart disease of native coronary artery without angina pectoris: Secondary | ICD-10-CM | POA: Diagnosis not present

## 2022-09-10 ENCOUNTER — Encounter: Payer: Self-pay | Admitting: Oncology

## 2022-09-16 ENCOUNTER — Other Ambulatory Visit: Payer: Self-pay | Admitting: Family Medicine

## 2022-09-17 ENCOUNTER — Ambulatory Visit (INDEPENDENT_AMBULATORY_CARE_PROVIDER_SITE_OTHER): Payer: PPO | Admitting: Family Medicine

## 2022-09-17 ENCOUNTER — Encounter: Payer: Self-pay | Admitting: Family Medicine

## 2022-09-17 VITALS — BP 143/71 | HR 66 | Temp 97.8°F | Wt 266.2 lb

## 2022-09-17 DIAGNOSIS — Z86711 Personal history of pulmonary embolism: Secondary | ICD-10-CM | POA: Diagnosis not present

## 2022-09-17 DIAGNOSIS — I5032 Chronic diastolic (congestive) heart failure: Secondary | ICD-10-CM | POA: Diagnosis not present

## 2022-09-17 MED ORDER — ACYCLOVIR 400 MG PO TABS: 400.0000 mg | ORAL_TABLET | Freq: Two times a day (BID) | ORAL | 1 refills | Status: DC

## 2022-09-17 MED ORDER — ASCORBIC ACID 500 MG PO TABS: ORAL_TABLET | ORAL | 3 refills | Status: DC

## 2022-09-17 NOTE — Telephone Encounter (Signed)
Unable to refill per protocol, Rx request is too soon. Last refill 06/02/22 for 90 and 1 refill.  Requested Prescriptions  Pending Prescriptions Disp Refills   metFORMIN (GLUCOPHAGE) 500 MG tablet [Pharmacy Med Name: METFORMIN HCL 500 MG TAB] 180 tablet 1    Sig: TAKE 1 TABLET BY MOUTH TWICE DAILY WITH A MEAL     Endocrinology:  Diabetes - Biguanides Failed - 09/16/2022  2:35 PM      Failed - Cr in normal range and within 360 days    Creatinine  Date Value Ref Range Status  06/30/2022 1.76 (H) 0.61 - 1.24 mg/dL Final   Creatinine, Ser  Date Value Ref Range Status  09/03/2022 1.57 (H) 0.76 - 1.27 mg/dL Final         Failed - HBA1C is between 0 and 7.9 and within 180 days    Hemoglobin A1C  Date Value Ref Range Status  01/11/2018 7.7  Final    Comment:    [facility]   HB A1C (BAYER DCA - WAIVED)  Date Value Ref Range Status  08/26/2022 9.0 (H) 4.8 - 5.6 % Final    Comment:             Prediabetes: 5.7 - 6.4          Diabetes: >6.4          Glycemic control for adults with diabetes: <7.0          Failed - eGFR in normal range and within 360 days    GFR calc Af Amer  Date Value Ref Range Status  04/17/2020 75 >59 mL/min/1.73 Final    Comment:    **In accordance with recommendations from the NKF-ASN Task force,**   [facility] is in the process of updating its eGFR calculation to the   2021 CKD-EPI creatinine equation that estimates kidney function   without a race variable.    GFR, Estimated  Date Value Ref Range Status  06/30/2022 38 (L) >60 mL/min Final    Comment:    (NOTE) Calculated using the CKD-EPI Creatinine Equation (2021)    eGFR  Date Value Ref Range Status  09/03/2022 44 (L) >59 mL/min/1.73 Final         Passed - B12 Level in normal range and within 720 days    Vitamin B-12  Date Value Ref Range Status  06/16/2022 306 180 - 914 pg/mL Final    Comment:    (NOTE) This assay is not validated for testing neonatal or myeloproliferative  syndrome specimens for Vitamin B12 levels. Performed at [facility], [address] [place], [place] [address]          Passed - Valid encounter within last 6 months    Recent Outpatient Visits           Today    [place] [facility] [place], [place] P, DO   2 weeks ago Encounter for annual wellness exam in Medicare patient   [place] [place], [place] P, DO   3 weeks ago Uncontrolled type 2 diabetes mellitus with hyperglycemia, with long-term current use of insulin (HCC)   [place] [facility], [place] P, DO   4 weeks ago Chronic diastolic CHF (congestive heart failure) [facility])   [place] [facility] [place], Megan P, DO   1 month ago Chronic diastolic CHF (congestive heart failure) [facility])    [facility] [place], Megan P, DO  Future Appointments             In 6 days Laural Benes, Oralia Rud, DO [place] [facility], [facility]   In 4 weeks Sondra Come, MD [facility] [place]            Passed - CBC within normal limits and completed in the last 12 months    WBC  Date Value Ref Range Status  06/22/2022 6.4 4.0 - 10.5 K/uL Final   WBC Count  Date Value Ref Range Status  06/30/2022 7.5 4.0 - 10.5 K/uL Final   RBC  Date Value Ref Range Status  06/30/2022 3.46 (L) 4.22 - 5.81 MIL/uL Final   RBC.  Date Value Ref Range Status  06/30/2022 3.41 (L) 4.22 - 5.81 MIL/uL Final   Hemoglobin  Date Value Ref Range Status  06/30/2022 9.0 (L) 13.0 - 17.0 g/dL Final  78/29/5621 9.4 (L) 13.0 - 17.7 g/dL Final   HCT  Date Value Ref Range Status  06/30/2022 28.9 (L) 39.0 - 52.0 % Final   Hematocrit  Date Value Ref Range Status  06/25/2022 29.9 (L) 37.5 - 51.0 % Final   MCHC  Date Value Ref Range Status  06/30/2022 31.1 30.0 - 36.0 g/dL Final   [facility]  Date Value Ref Range Status  06/30/2022 26.0 26.0 - 34.0 pg Final   MCV  Date Value Ref  Range Status  06/30/2022 83.5 80.0 - 100.0 fL Final  06/25/2022 82 79 - 97 fL Final   No results found for: "PLTCOUNTKUC", "LABPLAT", "POCPLA" RDW  Date Value Ref Range Status  06/30/2022 14.5 11.5 - 15.5 % Final  06/25/2022 14.0 11.6 - 15.4 % Final         Refused Prescriptions Disp Refills   GNP VITAMIN C 500 MG tablet [Pharmacy Med Name: GNP VITAMIN C 500 MG TAB] 30 tablet 0    Sig: TAKE 1 TABLET BY MOUTH ONCE DAILY TO KEEP YOUR IMMUNE SYSTEM UP     Endocrinology:  Vitamins Passed - 09/16/2022  2:35 PM      Passed - Valid encounter within last 12 months    Recent Outpatient Visits           Today    [place] [facility] [place], Megan P, DO   2 weeks ago Encounter for annual wellness exam in Medicare patient   [facility], Megan P, DO   3 weeks ago Uncontrolled type 2 diabetes mellitus with hyperglycemia, with long-term current use of insulin (HCC)   [place] [facility] [place], Megan P, DO   4 weeks ago Chronic diastolic CHF (congestive heart failure) (HCC)   [facility] [place], Megan P, DO   1 month ago Chronic diastolic CHF (congestive heart failure) (HCC)   [place] [facility] Dorcas Carrow, DO       Future Appointments             In 6 days Dorcas Carrow, DO [facility], [facility]   In 4 weeks Sondra Come, MD [facility] [place]             acyclovir (ZOVIRAX) 400 MG tablet [Pharmacy Med Name: ACYCLOVIR 400 MG TAB] 180 tablet 1    Sig: TAKE 1 TABLET BY MOUTH TWICE DAILY     Antimicrobials:  Antiviral Agents - Anti-Herpetic Passed - 09/16/2022  2:35 PM  Passed - Valid encounter within last 12 months    Recent Outpatient Visits           Today    [place] [facility] [place], [place] P, DO   2 weeks ago Encounter for annual wellness exam in Medicare patient   [place] [facility] [place], [place] P, DO   3 weeks ago Uncontrolled type 2 diabetes mellitus with hyperglycemia, with long-term current use of insulin [facility])   [facility], Megan P, DO   4 weeks ago Chronic diastolic CHF (congestive heart failure) [facility])   [facility] [place], Megan P, DO   1 month ago Chronic diastolic CHF (congestive heart failure) [facility])   [place] [facility] Dorcas Carrow, DO       Future Appointments             In 6 days Laural Benes, Oralia Rud, DO [place] [facility], [facility]   In 4 weeks Richardo Hanks, Laurette Schimke, MD [facility]

## 2022-09-17 NOTE — Patient Instructions (Signed)
Monday

## 2022-09-17 NOTE — Progress Notes (Signed)
BP (!) 143/71   Pulse 66   Temp 97.8 F (36.6 C) (Oral)   Wt 266 lb 3.2 oz (120.7 kg)   SpO2 97%   BMI 38.20 kg/m    Subjective:    Patient ID: Justin Griffith, male    DOB: 31-Aug-1940, 81 y.o.   MRN: [id number]  HPI: Justin Griffith is a 82 y.o. male  Chief Complaint  Patient presents with   Shortness of Breath    Pt states he keeps giving out of breath. States its been going on for about a month now.   Feeling significantly better. He notes that his breathing and swelling have improved. He is getting more sleep at night- really only getting 1x, although having trouble falling back to sleep. He denies any fevers or chills. No other concerns or complaints at this time.   Relevant past medical, surgical, family and social history reviewed and updated as indicated. Interim medical history since our last visit reviewed. Allergies and medications reviewed and updated.  Review of Systems  Constitutional: Negative.   HENT: Negative.    Respiratory: Negative.    Cardiovascular: Negative.   Gastrointestinal: Negative.   Musculoskeletal: Negative.   Skin: Negative.   Neurological: Negative.   Psychiatric/Behavioral: Negative.      Per HPI unless specifically indicated above     Objective:    BP (!) 143/71   Pulse 66   Temp 97.8 F (36.6 C) (Oral)   Wt 266 lb 3.2 oz (120.7 kg)   SpO2 97%   BMI 38.20 kg/m   Wt Readings from Last 3 Encounters:  09/17/22 266 lb 3.2 oz (120.7 kg)  09/03/22 269 lb 9.6 oz (122.3 kg)  08/26/22 262 lb (118.8 kg)    Physical Exam Vitals and nursing note reviewed.  Constitutional:      General: He is not in acute distress.    Appearance: Normal appearance. He is not ill-appearing, toxic-appearing or diaphoretic.  HENT:     Head: Normocephalic and atraumatic.     Right Ear: External ear normal.     Left Ear: External ear normal.     Nose: Nose normal.     Mouth/Throat:     Mouth: Mucous membranes are moist.     Pharynx: Oropharynx is  clear.  Eyes:     General: No scleral icterus.       Right eye: No discharge.        Left eye: No discharge.     Extraocular Movements: Extraocular movements intact.     Conjunctiva/sclera: Conjunctivae normal.     Pupils: Pupils are equal, round, and reactive to light.  Cardiovascular:     Rate and Rhythm: Normal rate and regular rhythm.     Pulses: Normal pulses.     Heart sounds: Murmur heard.     No friction rub. No gallop.  Pulmonary:     Effort: Pulmonary effort is normal. No respiratory distress.     Breath sounds: Normal breath sounds. No stridor. No wheezing, rhonchi or rales.  Chest:     Chest wall: No tenderness.  Musculoskeletal:        General: Normal range of motion.     Cervical back: Normal range of motion and neck supple.  Skin:    General: Skin is warm and dry.     Capillary Refill: Capillary refill takes less than 2 seconds.     Coloration: Skin is not jaundiced or pale.     Findings: No  bruising, erythema, lesion or rash.  Neurological:     General: No focal deficit present.     Mental Status: He is alert and oriented to person, place, and time. Mental status is at baseline.  Psychiatric:        Mood and Affect: Mood normal.        Behavior: Behavior normal.        Thought Content: Thought content normal.        Judgment: Judgment normal.     Results for orders placed or performed in visit on 09/03/22  Basic metabolic panel  Result Value Ref Range   Glucose 294 (H) 70 - 99 mg/dL   BUN 23 8 - 27 mg/dL   Creatinine, Ser 1.61 (H) 0.76 - 1.27 mg/dL   eGFR 44 (L) >09 UE/AVW/0.98   BUN/Creatinine Ratio 15 10 - 24   Sodium 130 (L) 134 - 144 mmol/L   Potassium 5.5 (H) 3.5 - 5.2 mmol/L   Chloride 95 (L) 96 - 106 mmol/L   CO2 22 20 - 29 mmol/L   Calcium 9.4 8.6 - 10.2 mg/dL      Assessment & Plan:   Problem List Items Addressed This Visit       Cardiovascular and Mediastinum   Chronic diastolic CHF (congestive heart failure) (HCC) - Primary     Tolerating his torsemide. Feeling well. Continue to monitor. Call with any concerns.         Other   History of pulmonary embolism 09/18/21 (Chronic)    Cardiology states they're OK stopping eliquis. Hematology had recommended continuing eliqus. Recommend continuing eliquis. He is seeing hematology in about 10 days. Await their input.         Follow up plan: Return in about 1 week (around 09/24/2022).  15 minutes spent with patient and wife today

## 2022-09-18 ENCOUNTER — Encounter: Payer: Self-pay | Admitting: Family Medicine

## 2022-09-18 NOTE — Assessment & Plan Note (Signed)
Tolerating his torsemide. Feeling well. Continue to monitor. Call with any concerns.

## 2022-09-18 NOTE — Assessment & Plan Note (Signed)
Cardiology states they're OK stopping eliquis. Hematology had recommended continuing eliqus. Recommend continuing eliquis. He is seeing hematology in about 10 days. Await their input.

## 2022-09-22 ENCOUNTER — Telehealth: Payer: Self-pay

## 2022-09-22 NOTE — Progress Notes (Signed)
   09/22/2022  Patient ID: Justin Griffith, male   DOB: 08/15/1940, 82 y.o.   MRN: [id number]  Outreach to check on status of PAP application for Eliquis.  [employer] states patient's application was initially missing information but everything has been received at this point, so they will process the application and expedite the process based on the delay.  They should have a decision in 24 hours.  Lenna Gilford, PharmD, DPLA

## 2022-09-23 ENCOUNTER — Inpatient Hospital Stay: Payer: PPO | Attending: Oncology

## 2022-09-23 ENCOUNTER — Encounter: Payer: Self-pay | Admitting: Family Medicine

## 2022-09-23 ENCOUNTER — Ambulatory Visit (INDEPENDENT_AMBULATORY_CARE_PROVIDER_SITE_OTHER): Payer: PPO | Admitting: Family Medicine

## 2022-09-23 ENCOUNTER — Telehealth: Payer: Self-pay

## 2022-09-23 VITALS — BP 137/69 | HR 77 | Temp 97.8°F | Wt 273.4 lb

## 2022-09-23 DIAGNOSIS — M4726 Other spondylosis with radiculopathy, lumbar region: Secondary | ICD-10-CM

## 2022-09-23 DIAGNOSIS — Z86718 Personal history of other venous thrombosis and embolism: Secondary | ICD-10-CM | POA: Insufficient documentation

## 2022-09-23 DIAGNOSIS — I5032 Chronic diastolic (congestive) heart failure: Secondary | ICD-10-CM | POA: Diagnosis not present

## 2022-09-23 DIAGNOSIS — Z9049 Acquired absence of other specified parts of digestive tract: Secondary | ICD-10-CM | POA: Insufficient documentation

## 2022-09-23 DIAGNOSIS — N3000 Acute cystitis without hematuria: Secondary | ICD-10-CM | POA: Diagnosis not present

## 2022-09-23 DIAGNOSIS — I2699 Other pulmonary embolism without acute cor pulmonale: Secondary | ICD-10-CM | POA: Insufficient documentation

## 2022-09-23 DIAGNOSIS — R5383 Other fatigue: Secondary | ICD-10-CM | POA: Insufficient documentation

## 2022-09-23 DIAGNOSIS — Z888 Allergy status to other drugs, medicaments and biological substances status: Secondary | ICD-10-CM | POA: Insufficient documentation

## 2022-09-23 DIAGNOSIS — Z8719 Personal history of other diseases of the digestive system: Secondary | ICD-10-CM | POA: Insufficient documentation

## 2022-09-23 DIAGNOSIS — M4722 Other spondylosis with radiculopathy, cervical region: Secondary | ICD-10-CM | POA: Diagnosis not present

## 2022-09-23 DIAGNOSIS — Z86711 Personal history of pulmonary embolism: Secondary | ICD-10-CM | POA: Insufficient documentation

## 2022-09-23 DIAGNOSIS — R350 Frequency of micturition: Secondary | ICD-10-CM | POA: Diagnosis not present

## 2022-09-23 DIAGNOSIS — G4733 Obstructive sleep apnea (adult) (pediatric): Secondary | ICD-10-CM | POA: Insufficient documentation

## 2022-09-23 DIAGNOSIS — M549 Dorsalgia, unspecified: Secondary | ICD-10-CM | POA: Insufficient documentation

## 2022-09-23 DIAGNOSIS — Z9089 Acquired absence of other organs: Secondary | ICD-10-CM | POA: Insufficient documentation

## 2022-09-23 DIAGNOSIS — E785 Hyperlipidemia, unspecified: Secondary | ICD-10-CM | POA: Insufficient documentation

## 2022-09-23 DIAGNOSIS — J4489 Other specified chronic obstructive pulmonary disease: Secondary | ICD-10-CM | POA: Insufficient documentation

## 2022-09-23 DIAGNOSIS — I110 Hypertensive heart disease with heart failure: Secondary | ICD-10-CM | POA: Insufficient documentation

## 2022-09-23 DIAGNOSIS — Z7901 Long term (current) use of anticoagulants: Secondary | ICD-10-CM | POA: Insufficient documentation

## 2022-09-23 DIAGNOSIS — Z818 Family history of other mental and behavioral disorders: Secondary | ICD-10-CM | POA: Insufficient documentation

## 2022-09-23 DIAGNOSIS — R319 Hematuria, unspecified: Secondary | ICD-10-CM | POA: Insufficient documentation

## 2022-09-23 DIAGNOSIS — R202 Paresthesia of skin: Secondary | ICD-10-CM | POA: Diagnosis not present

## 2022-09-23 DIAGNOSIS — E119 Type 2 diabetes mellitus without complications: Secondary | ICD-10-CM | POA: Insufficient documentation

## 2022-09-23 DIAGNOSIS — Z833 Family history of diabetes mellitus: Secondary | ICD-10-CM | POA: Insufficient documentation

## 2022-09-23 DIAGNOSIS — Z79899 Other long term (current) drug therapy: Secondary | ICD-10-CM | POA: Insufficient documentation

## 2022-09-23 DIAGNOSIS — N400 Enlarged prostate without lower urinary tract symptoms: Secondary | ICD-10-CM | POA: Insufficient documentation

## 2022-09-23 DIAGNOSIS — Z87891 Personal history of nicotine dependence: Secondary | ICD-10-CM | POA: Insufficient documentation

## 2022-09-23 DIAGNOSIS — I2510 Atherosclerotic heart disease of native coronary artery without angina pectoris: Secondary | ICD-10-CM | POA: Insufficient documentation

## 2022-09-23 DIAGNOSIS — Z800 Family history of malignant neoplasm of digestive organs: Secondary | ICD-10-CM | POA: Insufficient documentation

## 2022-09-23 DIAGNOSIS — R410 Disorientation, unspecified: Secondary | ICD-10-CM | POA: Insufficient documentation

## 2022-09-23 DIAGNOSIS — D509 Iron deficiency anemia, unspecified: Secondary | ICD-10-CM | POA: Insufficient documentation

## 2022-09-23 DIAGNOSIS — H9190 Unspecified hearing loss, unspecified ear: Secondary | ICD-10-CM | POA: Insufficient documentation

## 2022-09-23 DIAGNOSIS — R509 Fever, unspecified: Secondary | ICD-10-CM | POA: Insufficient documentation

## 2022-09-23 LAB — URINALYSIS, ROUTINE W REFLEX MICROSCOPIC
Bilirubin, UA: NEGATIVE
Glucose, UA: NEGATIVE
Ketones, UA: NEGATIVE
Nitrite, UA: NEGATIVE
RBC, UA: NEGATIVE
Specific Gravity, UA: 1.015 (ref 1.005–1.030)
Urobilinogen, Ur: 0.2 mg/dL (ref 0.2–1.0)
pH, UA: 7 (ref 5.0–7.5)

## 2022-09-23 LAB — MICROSCOPIC EXAMINATION
Bacteria, UA: NONE SEEN
RBC, Urine: NONE SEEN /hpf (ref 0–2)

## 2022-09-23 MED ORDER — CEFTRIAXONE SODIUM 500 MG IJ SOLR
1000.0000 mg | Freq: Once | INTRAMUSCULAR | Status: AC
Start: 2022-09-23 — End: 2022-09-23
  Administered 2022-09-23: 1000 mg via INTRAMUSCULAR

## 2022-09-23 NOTE — Progress Notes (Signed)
   09/23/2022  Patient ID: Justin Griffith, male   DOB: 09-28-1940, 82 y.o.   MRN: [id number]  Contacted [employer] to follow-up on processing of Justin Griffith's Eliquis PAP application.  Application review completed and application denied based on patient not providing proof of income. Income reporting system pulled household income of $38k/year, which would make the required out of pocket expenditure on medications this year to be $996.43.  If income is different, patient will need to provide proof of income:  tax forms or SS statemes w/ written statemet about taxes.  Patient is to be seen soon to determine longevity of Eliquis therapy;  Consulting PCP to verify samples were given to evaluate if these will last until that provider appointment.  Lenna Gilford, PharmD, DPLA

## 2022-09-23 NOTE — Assessment & Plan Note (Signed)
Referral to PM&R to discuss treatment options. Referral placed today.

## 2022-09-23 NOTE — Assessment & Plan Note (Signed)
Referral to PM&R to discuss treatment options. Referral placed today.  

## 2022-09-23 NOTE — Progress Notes (Signed)
BP 137/69   Pulse 77   Temp 97.8 F (36.6 C) (Oral)   Wt 273 lb 6.4 oz (124 kg)   SpO2 98%   BMI 39.23 kg/m    Subjective:    Patient ID: Justin Griffith, male    DOB: Sep 25, 1940, 82 y.o.   MRN: [id number]  HPI: Justin Griffith is a 82 y.o. male  Chief Complaint  Patient presents with   Congestive Heart Failure   Shortness of Breath   URINARY SYMPTOMS Duration: couple of days Dysuria: burning Urinary frequency: yes Urgency: yes Small volume voids: yes Symptom severity: severe Urinary incontinence: yes Foul odor: no Hematuria: no Abdominal pain: no Back pain: no Suprapubic pain/pressure: no Flank pain: no Fever:  no Vomiting: no Relief with cranberry juice: no Relief with pyridium: no Status: worse Previous urinary tract infection: yes Recurrent urinary tract infection: yes History of sexually transmitted disease: no Penile discharge: no Treatments attempted: none   NUMBNESS Duration: chronic Onset: gradual Location: R hand Bilateral: no Symmetric: no Decreased sensation: no  Weakness: no Pain: no Quality:  numb and tingling Severity: moderate  Frequency: constant Trauma: no Recent illness: no Diabetes: no Thyroid disease: no  HIV: no  Alcoholism: no  Spinal cord injury: no Status: uncontrolled   Relevant past medical, surgical, family and social history reviewed and updated as indicated. Interim medical history since our last visit reviewed. Allergies and medications reviewed and updated.  Review of Systems  Constitutional: Negative.   Respiratory:  Positive for shortness of breath. Negative for apnea, cough, choking, chest tightness, wheezing and stridor.   Cardiovascular:  Positive for leg swelling. Negative for chest pain and palpitations.  Gastrointestinal: Negative.   Musculoskeletal: Negative.   Neurological:  Positive for numbness. Negative for dizziness, tremors, seizures, syncope, facial asymmetry, speech difficulty, weakness,  light-headedness and headaches.  Psychiatric/Behavioral: Negative.      Per HPI unless specifically indicated above     Objective:    BP 137/69   Pulse 77   Temp 97.8 F (36.6 C) (Oral)   Wt 273 lb 6.4 oz (124 kg)   SpO2 98%   BMI 39.23 kg/m   Wt Readings from Last 3 Encounters:  09/23/22 273 lb 6.4 oz (124 kg)  09/17/22 266 lb 3.2 oz (120.7 kg)  09/03/22 269 lb 9.6 oz (122.3 kg)    Physical Exam Vitals and nursing note reviewed.  Constitutional:      General: He is not in acute distress.    Appearance: Normal appearance. He is well-developed. He is obese. He is not ill-appearing, toxic-appearing or diaphoretic.  HENT:     Head: Normocephalic and atraumatic.     Right Ear: External ear normal.     Left Ear: External ear normal.     Nose: Nose normal.     Mouth/Throat:     Mouth: Mucous membranes are moist.     Pharynx: Oropharynx is clear.  Eyes:     General: No scleral icterus.       Right eye: No discharge.        Left eye: No discharge.     Extraocular Movements: Extraocular movements intact.     Conjunctiva/sclera: Conjunctivae normal.     Pupils: Pupils are equal, round, and reactive to light.  Cardiovascular:     Rate and Rhythm: Normal rate and regular rhythm.     Pulses: Normal pulses.     Heart sounds: Murmur heard.     No friction rub. No  gallop.  Pulmonary:     Effort: Pulmonary effort is normal. No respiratory distress.     Breath sounds: Normal breath sounds. No stridor. No wheezing, rhonchi or rales.  Chest:     Chest wall: No tenderness.  Musculoskeletal:        General: Normal range of motion.     Cervical back: Normal range of motion and neck supple.     Right lower leg: Edema (1+) present.     Left lower leg: Edema (1+) present.  Skin:    General: Skin is warm and dry.     Capillary Refill: Capillary refill takes less than 2 seconds.     Coloration: Skin is not jaundiced or pale.     Findings: No bruising, erythema, lesion or rash.   Neurological:     General: No focal deficit present.     Mental Status: He is alert and oriented to person, place, and time. Mental status is at baseline.  Psychiatric:        Mood and Affect: Mood normal.        Behavior: Behavior normal.        Thought Content: Thought content normal.        Judgment: Judgment normal.     Results for orders placed or performed in visit on 09/23/22  Urine Culture   Specimen: Urine   UR  Result Value Ref Range   Urine Culture, Routine Final report    Organism ID, Bacteria Comment   Microscopic Examination   Urine  Result Value Ref Range   WBC, UA >30W 0 - 5 /hpf   RBC, Urine None seen 0 - 2 /hpf   Epithelial Cells (non renal) 0-10 0 - 10 /hpf   Bacteria, UA None seen None seen/Few  Urinalysis, Routine w reflex microscopic  Result Value Ref Range   Specific Gravity, UA 1.015 1.005 - 1.030   pH, UA 7.0 5.0 - 7.5   Color, UA Yellow Yellow   Appearance Ur Cloudy (A) Clear   Leukocytes,UA 3+ (A) Negative   Protein,UA 3+ (A) Negative/Trace   Glucose, UA Negative Negative   Ketones, UA Negative Negative   RBC, UA Negative Negative   Bilirubin, UA Negative Negative   Urobilinogen, Ur 0.2 0.2 - 1.0 mg/dL   Nitrite, UA Negative Negative   Microscopic Examination See below:       Assessment & Plan:   Problem List Items Addressed This Visit       Cardiovascular and Mediastinum   Chronic diastolic CHF (congestive heart failure) (HCC)    Held his torsemide due to urinary frequency. Will restart. Recheck in about 10 days.         Nervous and Auditory   Osteoarthritis of spine with radiculopathy, cervical region    Referral to PM&R to discuss treatment options. Referral placed today.       Relevant Orders   Ambulatory referral to Physical Medicine Rehab   Osteoarthritis of spine with radiculopathy, lumbar region    Referral to PM&R to discuss treatment options. Referral placed today.       Relevant Orders   Ambulatory referral to  Physical Medicine Rehab   Other Visit Diagnoses     Acute cystitis without hematuria    -  Primary   Ceftriaxone shot given today. Await culture. If not better in 3 days, call to come in.   Relevant Medications   cefTRIAXone (ROCEPHIN) injection 1,000 mg (Completed)   Urinary frequency  3+ leuks   Relevant Orders   Urinalysis, Routine w reflex microscopic (Completed)   Urine Culture (Completed)   Paresthesias       Likely due to DJD- will get him into PM&R for evaluation. Referral generated today.        Follow up plan: Return in about 10 days (around 10/03/2022).

## 2022-09-23 NOTE — Assessment & Plan Note (Signed)
Held his torsemide due to urinary frequency. Will restart. Recheck in about 10 days.

## 2022-09-25 ENCOUNTER — Ambulatory Visit (INDEPENDENT_AMBULATORY_CARE_PROVIDER_SITE_OTHER): Payer: PPO

## 2022-09-25 ENCOUNTER — Encounter: Payer: Self-pay | Admitting: Family Medicine

## 2022-09-25 ENCOUNTER — Ambulatory Visit: Payer: PPO

## 2022-09-25 ENCOUNTER — Telehealth: Payer: Self-pay

## 2022-09-25 ENCOUNTER — Telehealth: Payer: Self-pay | Admitting: Family Medicine

## 2022-09-25 ENCOUNTER — Encounter: Payer: Self-pay | Admitting: Oncology

## 2022-09-25 DIAGNOSIS — J449 Chronic obstructive pulmonary disease, unspecified: Secondary | ICD-10-CM

## 2022-09-25 DIAGNOSIS — I5032 Chronic diastolic (congestive) heart failure: Secondary | ICD-10-CM

## 2022-09-25 DIAGNOSIS — R350 Frequency of micturition: Secondary | ICD-10-CM

## 2022-09-25 DIAGNOSIS — Z794 Long term (current) use of insulin: Secondary | ICD-10-CM

## 2022-09-25 LAB — URINE CULTURE

## 2022-09-25 NOTE — Patient Instructions (Addendum)
Error in charting- see new encounter

## 2022-09-25 NOTE — Patient Instructions (Signed)
Please call the care guide team at [phone] if you need to cancel or reschedule your appointment.   If you are experiencing a Mental Health or Behavioral Health Crisis or need someone to talk to, please call the Suicide and Crisis Lifeline: 988 call the [place] National Suicide Prevention Lifeline: 626-593-5867 or TTY: 647-079-6616 TTY 740-460-4803) to talk to a trained counselor call 1-800-273-TALK (toll free, 24 hour hotline)   Following is a copy of the CCM Program Consent:  CCM service includes personalized support from designated clinical staff supervised by the physician, including individualized plan of care and coordination with other care providers 24/7 contact phone numbers for assistance for urgent and routine care needs. Service will only be billed when office clinical staff spend 20 minutes or more in a month to coordinate care. Only one practitioner may furnish and bill the service in a calendar month. The patient may stop CCM services at amy time (effective at the end of the month) by phone call to the office staff. The patient will be responsible for cost sharing (co-pay) or up to 20% of the service fee (after annual deductible is met)  Following is a copy of your full provider care plan:   Goals Addressed             This Visit's Progress    CCM Expected Outcome:  Monitor, Self-Manage and Reduce Symptoms of  COPD         Current Barriers:  Knowledge Deficits related to factors that may cause the patient to have an exacerbation of COPD Care Coordination needs related to cost constraints  in a patient with COPD and needing coverage help with [employer] Chronic Disease Management support and education needs related to effective management of COPD 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   Planned Interventions: Provided patient with basic written and verbal COPD education on self  care/management/and exacerbation prevention. The patient denies any issues with his breathing. The patient states his COPD is stable. The patient is having elevations in his blood pressures. Sees the pcp regularly. Adjustment of medications. The patient states he has been having shortness of breath but nothing out of the ordinary or unusual. The patient has not taken his fluid pill x 2 days and the [facility] did discuss with him the need to make sure he takes his medications as directed to prevent fluid overload and his shortness of breath getting worse.  Advised patient to track and manage COPD triggers. Review of factors that cause exacerbation. The patient states his breathing is stable right now and he is doing okay.  Provided written and verbal instructions on pursed lip breathing and utilized returned demonstration as teach back Provided instruction about proper use of medications used for management of COPD including inhalers. States compliance with medications.  Advised patient to self assesses COPD action plan zone and make appointment with provider if in the yellow zone for 48 hours without improvement Provided education about and advised patient to utilize infection prevention strategies to reduce risk of respiratory infection Discussed the importance of adequate rest and management of fatigue with COPD Screening for signs and symptoms of depression related to chronic disease state  Assessed social determinant of health barriers Pharm D referral for assistance with [place] cost constraints. The patient is working with the pharm D.   Symptom Management: Take medications as prescribed   Attend all scheduled provider appointments Call provider office for new  concerns or questions  call the Suicide and Crisis Lifeline: 988 call the [place] National Suicide Prevention Lifeline: (412)080-5495 or TTY: 608-820-6113 TTY 734-768-9339) to talk to a trained counselor call 1-800-273-TALK (toll free, 24 hour  hotline) if experiencing a Mental Health or Behavioral Health Crisis  avoid second hand smoke eliminate smoking in my home identify and remove indoor air pollutants limit outdoor activity during cold weather listen for public air quality announcements every day develop a rescue plan eliminate symptom triggers at home follow rescue plan if symptoms flare-up  Follow Up Plan: Telephone follow up appointment with care management team member scheduled for: 11-06-2022 at 1145 am         CCM Expected Outcome:  Monitor, Self-Manage and Reduce Symptoms of Diabetes       Current Barriers:  Knowledge Deficits related to the importance of good blood sugar control in a patient with DM Care Coordination needs related to ongoing education and support for management of medications and disease management  in a patient with DM Chronic Disease Management support and education needs related to effective management of DM Lacks caregiver support.   Lab Results  Component Value Date   HGBA1C 9.0 (H) 08/26/2022     Planned Interventions: Provided education to patient about basic DM disease process. DM is out of control again. The patient states he is taking his medications and watching what he eats. The patient sometimes does forget to take his medications. Review and education provided. Education on making sure the patient takes his medication as instructed. His A1C is still elevated but trending down. The patient denies any acute findings in his DM at this time ; Reviewed medications with patient and discussed importance of medication adherence. The patient is now using pill packs and he says this is working out well for him. Encouraged him to take his medications as prescribed and call for any questions or concerns. Denies any issues with his DM at this time. Will continue to monitor. DM is stable. Reviewed prescribed diet with patient heart healthy/ADA diet. The patient states compliance with heart  healthy/Ada diet. Education given on monitoring for hidden sodium; Counseled on importance of regular laboratory monitoring as prescribed. Has regular labwork        Discussed plans with patient for ongoing care management follow up and provided patient with direct contact information for care management team;      Provided patient with written educational materials related to hypo and hyperglycemia and importance of correct treatment;       Reviewed scheduled/upcoming provider appointments including: 10-05-2022, reminder given today;         call provider for findings outside established parameters;       Review of patient status, including review of consultants reports, relevant laboratory and other test results, and medications completed;       Advised patient to discuss changes in his DM and questions or concerns with provider;      Screening for signs and symptoms of depression related to chronic disease state;        Assessed social determinant of health barriers;         Symptom Management: Take medications as prescribed   Attend all scheduled provider appointments Call provider office for new concerns or questions  call the Suicide and Crisis Lifeline: 988 call the [place] National Suicide Prevention Lifeline: 938-627-3153 or TTY: (726)391-7732 TTY 934-111-2642) to talk to a trained counselor call 1-800-273-TALK (toll free, 24 hour hotline) if experiencing a Mental  Health or Behavioral Health Crisis  check feet daily for cuts, sores or redness trim toenails straight across manage portion size wash and dry feet carefully every day wear comfortable, cotton socks wear comfortable, well-fitting shoes  Follow Up Plan: Telephone follow up appointment with care management team member scheduled for: 11-06-2022 at 1145 am       CCM Expected Outcome:  Monitor, Self-Manage and Reduce Symptoms of Heart Failure       Current Barriers:  Knowledge Deficits related to the importance of monitoring  for changes in water weight and effective management of HF Chronic Disease Management support and education needs related to effective management of HF Lacks caregiver support.  Wt Readings from Last 3 Encounters:  09/23/22 273 lb 6.4 oz (124 kg)  09/17/22 266 lb 3.2 oz (120.7 kg)  09/03/22 269 lb 9.6 oz (122.3 kg)    BP Readings from Last 3 Encounters:  09/23/22 137/69  09/17/22 (!) 143/71  09/03/22 138/68     Planned Interventions: Basic overview and discussion of pathophysiology of Heart Failure reviewed. The patients weight is up and down.  The patient has not taken his torsemide x 2 days. Long standing history of non-compliance. The patient states he has not gotten "any sleep" the last two nights and its because he has been peeing all night and it is driving him "crazy". He states he cannot keep going like this. Extensive review of sx and sx of fluid overload and the patient states he will take him medicine tomorrow. In basket message sent to the pcp due to the patient stating his urine is still "cloudy". Will follow up accordingly. The patient is seeing the pcp more frequently at this time.   Provided education on low sodium diet. The patient states he does not add salt to his foods. He uses a product called "no salt". The patient states that he is good about not eating foods high in sodium.  Reminder provided on making sure he follows a heart healthy/ADA diet. Review and education provided Reviewed Heart Failure Action Plan in depth and provided written copy. Home health is reporting weight changes to the pcp.  Assessed need for readable accurate scales in home. Discussed safety in weighing. The patient has a hight fall risk. Uses a walker. The patient had not taken his medication or weighed at the time of the outreach x 2 days. Review of the benefits of weights and weight fluctuations determining exacerbations in HF. The patient states he is going to the bathroom and he feels fine. Denies  any shortness of breath, says his shortness of breath is about the same as usual and he has some swelling but it is not bad.  Provided education about placing scale on hard, flat surface Advised patient to weigh each morning after emptying bladder. The patient no longer has an indwelling catheter but he is having to go to the bathroom frequently. The patient is thankful not to have the catheter. He says he will not do that again as it was not pleasant. Education on being safe and preventing falls.  Discussed importance of daily weight and advised patient to weigh and record daily. The patient is having fluctuations of weights at this time. Education on the benefits of daily weights and monitoring for weight changes of +2/3 pounds in one day and +5 pounds in a week.  Reviewed role of diuretics in prevention of fluid overload and management of heart failure. The patient states he is taking his medications  as prescribed: Instructed to take Torsemide 40 mg every am. The patient has not taken his medications this am  or yesterday am, as he states he has been going to the bathroom frequently. Education on following the recommendations of the pcp.   Discussed the importance of keeping all appointments with provider. The patient has a follow up with the pcp on 10-05-2022  reminder provided today.   Provided patient with education about the role of exercise in the management of heart failure Advised patient to discuss changes in his heart failure or heart health with provider Screening for signs and symptoms of depression related to chronic disease state  Assessed social determinant of health barriers  Symptom Management: Take medications as prescribed   Attend all scheduled provider appointments Call provider office for new concerns or questions  call the Suicide and Crisis Lifeline: 988 call the [place] National Suicide Prevention Lifeline: 502-478-0822 or TTY: 775 436 1308 TTY (251)527-2952) to talk to a  trained counselor call 1-800-273-TALK (toll free, 24 hour hotline) if experiencing a Mental Health or Behavioral Health Crisis  call office if I gain more than 2 pounds in one day or 5 pounds in one week keep legs up while sitting use salt in moderation watch for swelling in feet, ankles and legs every day weigh myself daily develop a rescue plan follow rescue plan if symptoms flare-up track symptoms and what helps feel better or worse dress right for the weather, hot or cold  Follow Up Plan: Telephone follow up appointment with care management team member scheduled for: 11-06-2022 at 1145 am          The patient verbalized understanding of instructions, educational materials, and care plan provided today and DECLINED offer to receive copy of patient instructions, educational materials, and care plan.  Telephone follow up appointment with care management team member scheduled for: 11-06-2022 at 1145 am

## 2022-09-25 NOTE — Chronic Care Management (AMB) (Deleted)
Chronic Care Management   CCM RN Visit Note  09/25/2022 Name: Justin Griffith MRN: [id number] DOB: October 05, 1940  Subjective: Justin Griffith is a 82 y.o. year old male who is a primary care patient of Dorcas Carrow, DO. The patient was referred to the Chronic Care Management team for assistance with care management needs subsequent to provider initiation of CCM services and plan of care.    Today's Visit:  Engaged with patient by telephone for follow up visit.        Goals Addressed             This Visit's Progress    CCM Expected Outcome:  Monitor, Self-Manage and Reduce Symptoms of  COPD         Current Barriers:  Knowledge Deficits related to factors that may cause the patient to have an exacerbation of COPD Care Coordination needs related to cost constraints  in a patient with COPD and needing coverage help with [employer] Chronic Disease Management support and education needs related to effective management of COPD 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   Planned Interventions: Provided patient with basic written and verbal COPD education on self care/management/and exacerbation prevention. The patient denies any issues with his breathing. The patient states his COPD is stable. The patient is having elevations in his blood pressures. Sees the pcp regularly. Adjustment of medications. The patient states he has been having shortness of breath but nothing out of the ordinary or unusual. The patient has not taken his fluid pill x 2 days and the [facility] did discuss with him the need to make sure he takes his medications as directed to prevent fluid overload and his shortness of breath getting worse.  Advised patient to track and manage COPD triggers. Review of factors that cause exacerbation. The patient states his breathing is stable right now and he is doing okay.  Provided written and verbal instructions  on pursed lip breathing and utilized returned demonstration as teach back Provided instruction about proper use of medications used for management of COPD including inhalers. States compliance with medications.  Advised patient to self assesses COPD action plan zone and make appointment with provider if in the yellow zone for 48 hours without improvement Provided education about and advised patient to utilize infection prevention strategies to reduce risk of respiratory infection Discussed the importance of adequate rest and management of fatigue with COPD Screening for signs and symptoms of depression related to chronic disease state  Assessed social determinant of health barriers Pharm D referral for assistance with [facility] cost constraints. The patient is working with the pharm D.   Symptom Management: Take medications as prescribed   Attend all scheduled provider appointments Call provider office for new concerns or questions  call the Suicide and Crisis Lifeline: 988 call the [place] National Suicide Prevention Lifeline: 5194241935 or TTY: (862)760-8198 TTY 629-414-3287) to talk to a trained counselor call 1-800-273-TALK (toll free, 24 hour hotline) if experiencing a Mental Health or Behavioral Health Crisis  avoid second hand smoke eliminate smoking in my home identify and remove indoor air pollutants limit outdoor activity during cold weather listen for public air quality announcements every day develop a rescue plan eliminate symptom triggers at home follow rescue plan if symptoms flare-up  Follow Up Plan: Telephone follow up appointment with care management team member scheduled for: 11-06-2022 at 1145 am       CCM Expected  Outcome:  Monitor, Self-Manage and Reduce Symptoms of Diabetes       Current Barriers:  Knowledge Deficits related to the importance of good blood sugar control in a patient with DM Care Coordination needs related to ongoing education and support for  management of medications and disease management  in a patient with DM Chronic Disease Management support and education needs related to effective management of DM Lacks caregiver support.   Lab Results  Component Value Date   HGBA1C 9.0 (H) 08/26/2022     Planned Interventions: Provided education to patient about basic DM disease process. DM is out of control again. The patient states he is taking his medications and watching what he eats. The patient sometimes does forget to take his medications. Review and education provided. Education on making sure the patient takes his medication as instructed. His A1C is still elevated but trending down. The patient denies any acute findings in his DM at this time ; Reviewed medications with patient and discussed importance of medication adherence. The patient is now using pill packs and he says this is working out well for him. Encouraged him to take his medications as prescribed and call for any questions or concerns. Denies any issues with his DM at this time. Will continue to monitor. DM is stable. Reviewed prescribed diet with patient heart healthy/ADA diet. The patient states compliance with heart healthy/Ada diet. Education given on monitoring for hidden sodium; Counseled on importance of regular laboratory monitoring as prescribed. Has regular labwork        Discussed plans with patient for ongoing care management follow up and provided patient with direct contact information for care management team;      Provided patient with written educational materials related to hypo and hyperglycemia and importance of correct treatment;       Reviewed scheduled/upcoming provider appointments including: 10-05-2022, reminder given today;         call provider for findings outside established parameters;       Review of patient status, including review of consultants reports, relevant laboratory and other test results, and medications completed;       Advised  patient to discuss changes in his DM and questions or concerns with provider;      Screening for signs and symptoms of depression related to chronic disease state;        Assessed social determinant of health barriers;         Symptom Management: Take medications as prescribed   Attend all scheduled provider appointments Call provider office for new concerns or questions  call the Suicide and Crisis Lifeline: 988 call the [place] National Suicide Prevention Lifeline: 406-492-8083 or TTY: (774)014-3969 TTY 804-597-7223) to talk to a trained counselor call 1-800-273-TALK (toll free, 24 hour hotline) if experiencing a Mental Health or Behavioral Health Crisis  check feet daily for cuts, sores or redness trim toenails straight across manage portion size wash and dry feet carefully every day wear comfortable, cotton socks wear comfortable, well-fitting shoes  Follow Up Plan: Telephone follow up appointment with care management team member scheduled for: 11-06-2022 at 1145 am       CCM Expected Outcome:  Monitor, Self-Manage and Reduce Symptoms of Heart Failure       Current Barriers:  Knowledge Deficits related to the importance of monitoring for changes in water weight and effective management of HF Chronic Disease Management support and education needs related to effective management of HF Lacks caregiver support.  Wt Readings from  Last 3 Encounters:  09/23/22 273 lb 6.4 oz (124 kg)  09/17/22 266 lb 3.2 oz (120.7 kg)  09/03/22 269 lb 9.6 oz (122.3 kg)    BP Readings from Last 3 Encounters:  09/23/22 137/69  09/17/22 (!) 143/71  09/03/22 138/68     Planned Interventions: Basic overview and discussion of pathophysiology of Heart Failure reviewed. The patients weight is up and down.  The patient has not taken his torsemide x 2 days. Long standing history of non-compliance. The patient states he has not gotten "any sleep" the last two nights and its because he has been peeing all night  and it is driving him "crazy". He states he cannot keep going like this. Extensive review of sx and sx of fluid overload and the patient states he will take him medicine tomorrow. In basket message sent to the pcp due to the patient stating his urine is still "cloudy". Will follow up accordingly. The patient is seeing the pcp more frequently at this time.   Provided education on low sodium diet. The patient states he does not add salt to his foods. He uses a product called "no salt". The patient states that he is good about not eating foods high in sodium.  Reminder provided on making sure he follows a heart healthy/ADA diet. Review and education provided Reviewed Heart Failure Action Plan in depth and provided written copy. Home health is reporting weight changes to the pcp.  Assessed need for readable accurate scales in home. Discussed safety in weighing. The patient has a hight fall risk. Uses a walker. The patient had not taken his medication or weighed at the time of the outreach x 2 days. Review of the benefits of weights and weight fluctuations determining exacerbations in HF. The patient states he is going to the bathroom and he feels fine. Denies any shortness of breath, says his shortness of breath is about the same as usual and he has some swelling but it is not bad.  Provided education about placing scale on hard, flat surface Advised patient to weigh each morning after emptying bladder. The patient no longer has an indwelling catheter but he is having to go to the bathroom frequently. The patient is thankful not to have the catheter. He says he will not do that again as it was not pleasant. Education on being safe and preventing falls.  Discussed importance of daily weight and advised patient to weigh and record daily. The patient is having fluctuations of weights at this time. Education on the benefits of daily weights and monitoring for weight changes of +2/3 pounds in one day and +5 pounds in a  week.  Reviewed role of diuretics in prevention of fluid overload and management of heart failure. The patient states he is taking his medications as prescribed: Instructed to take Torsemide 40 mg every am. The patient has not taken his medications this am  or yesterday am, as he states he has been going to the bathroom frequently. Education on following the recommendations of the pcp.   Discussed the importance of keeping all appointments with provider. The patient has a follow up with the pcp on 10-05-2022  reminder provided today.   Provided patient with education about the role of exercise in the management of heart failure Advised patient to discuss changes in his heart failure or heart health with provider Screening for signs and symptoms of depression related to chronic disease state  Assessed social determinant of health barriers  Symptom  Management: Take medications as prescribed   Attend all scheduled provider appointments Call provider office for new concerns or questions  call the Suicide and Crisis Lifeline: 988 call the [place] National Suicide Prevention Lifeline: (551)870-2049 or TTY: 530 318 7214 TTY 216-524-4487) to talk to a trained counselor call 1-800-273-TALK (toll free, 24 hour hotline) if experiencing a Mental Health or Behavioral Health Crisis  call office if I gain more than 2 pounds in one day or 5 pounds in one week keep legs up while sitting use salt in moderation watch for swelling in feet, ankles and legs every day weigh myself daily develop a rescue plan follow rescue plan if symptoms flare-up track symptoms and what helps feel better or worse dress right for the weather, hot or cold  Follow Up Plan: Telephone follow up appointment with care management team member scheduled for: 11-06-2022 at 1145 am          Plan:Telephone follow up appointment with care management team member scheduled for:  11-06-2022 at 1145 am  Alto Denver RN, MSN, CCM RN Care Manager   Chronic Care Management Direct Number: [phone]

## 2022-09-25 NOTE — Telephone Encounter (Signed)
Please get him in for appointment before I return.

## 2022-09-25 NOTE — Telephone Encounter (Signed)
   CCM RN Visit Note   09-25-2022 Name: Justin Griffith MRN: [id number]      DOB: September 09, 1940  Subjective: Justin Griffith is a 82 y.o. year old male who is a primary care patient of Dr. Laural Benes. The patient was referred to the Chronic Care Management team for assistance with care management needs subsequent to provider initiation of CCM services and plan of care.      Call made back to the patient to tell the patient that he needed to go by and give a urine sample but there was no answer and no ability to leave a message.  Plan:Telephone follow up appointment with care management team member scheduled for:  11-06-2022 at 1145 am  Alto Denver RN, MSN, CCM RN Care Manager  Chronic Care Management Direct Number: [phone]

## 2022-09-25 NOTE — Addendum Note (Signed)
Addended by: Marlowe Sax on: 09/25/2022 11:30 AM   Modules accepted: Orders, Level of Service

## 2022-09-25 NOTE — Chronic Care Management (AMB) (Signed)
   09/25/2022  Justin Griffith [birth year] [id number]   Error- see new encounter

## 2022-09-25 NOTE — Telephone Encounter (Signed)
-----   Message from Marlowe Sax, RN sent at 09/25/2022 11:04 AM EDT ----- Regarding: Patient wants you to know urine is still cloudy. Also not taking his fluid pill x 2 days Hey Dr. Laural Benes and Staff, I had my  regular outreach with Justin Griffith today. He says he has not taking his fluid pill x 2 days because he has been up the last 2 nights and getting no sleep and he can not keep going like that. I talked to him about fluid overload and shortness of breath and he says he knows. He said he would take it tomorrow. Also he says his urine is still cloudy. He said sometimes it hurts when he pees, sometimes not. Denies odor or any acute findings. I told him I would let you know. Please let me know if you have any recommendations. I can call him back if I need to.  Thanks, Pam

## 2022-09-25 NOTE — Progress Notes (Signed)
This encounter was created in error - please disregard.

## 2022-09-25 NOTE — Chronic Care Management (AMB) (Signed)
Chronic Care Management   CCM RN Visit Note  09/25/2022 Name: Justin Griffith MRN: [id number] DOB: 05/09/1940  Subjective: Justin Griffith is a 82 y.o. year old male who is a primary care patient of Johnson, Megan P, DO. The patient was referred to the Chronic Care Management team for assistance with care management needs subsequent to provider initiation of CCM services and plan of care.    Today's Visit:  Engaged with patient by telephone for follow up visit.        Goals Addressed             This Visit's Progress    CCM Expected Outcome:  Monitor, Self-Manage and Reduce Symptoms of  COPD         Current Barriers:  Knowledge Deficits related to factors that may cause the patient to have an exacerbation of COPD Care Coordination needs related to cost constraints  in a patient with COPD and needing coverage help with Brez[employer] Chronic Disease Management support and education needs related to effective management of COPD Lacks caregiver support.  Financial Constraints. Needs help with coverage on Brez[employer]- can we see about patient assistance?   Planned Interventions: Provided patient with basic written and verbal COPD education on self care/management/and exacerbation prevention. The patient denies any issues with his breathing. The patient states his COPD is stable. The patient is having elevations in his blood pressures. Sees the pcp regularly. Adjustment of medications. The patient states he has been having shortness of breath but nothing out of the ordinary or unusual. The patient has not taken his fluid pill x 2 days and the RNCM did discuss with him the need to make sure he takes his medications as directed to prevent fluid overload and his shortness of breath getting worse.  Advised patient to track and manage COPD triggers. Review of factors that cause exacerbation. The patient states his breathing is stable right now and he is doing okay.  Provided written and verbal instructions  on pursed lip breathing and utilized returned demonstration as teach back Provided instruction about proper use of medications used for management of COPD including inhalers. States compliance with medications.  Advised patient to self assesses COPD action plan zone and make appointment with provider if in the yellow zone for 48 hours without improvement Provided education about and advised patient to utilize infection prevention strategies to reduce risk of respiratory infection Discussed the importance of adequate rest and management of fatigue with COPD Screening for signs and symptoms of depression related to chronic disease state  Assessed social determinant of health barriers Pharm D referral for assistance with Bre[employer] cost constraints. The patient is working with the pharm D.   Symptom Management: Take medications as prescribed   Attend all scheduled provider appointments Call provider office for new concerns or questions  call the Suicide and Crisis Lifeline: 988 call the [place] National Suicide Prevention Lifeline: 1-800-273-8255 or TTY: 1-800-799-4 TTY (1-800-799-4889) to talk to a trained counselor call 1-800-273-TALK (toll free, 24 hour hotline) if experiencing a Mental Health or Behavioral Health Crisis  avoid second hand smoke eliminate smoking in my home identify and remove indoor air pollutants limit outdoor activity during cold weather listen for public air quality announcements every day develop a rescue plan eliminate symptom triggers at home follow rescue plan if symptoms flare-up  Follow Up Plan: Telephone follow up appointment with care management team member scheduled for: 11-06-2022 at 1145 am       CCM Expected   Outcome:  Monitor, Self-Manage and Reduce Symptoms of Diabetes       Current Barriers:  Knowledge Deficits related to the importance of good blood sugar control in a patient with DM Care Coordination needs related to ongoing education and support for  management of medications and disease management  in a patient with DM Chronic Disease Management support and education needs related to effective management of DM Lacks caregiver support.   Lab Results  Component Value Date   HGBA1C 9.0 (H) 08/26/2022     Planned Interventions: Provided education to patient about basic DM disease process. DM is out of control again. The patient states he is taking his medications and watching what he eats. The patient sometimes does forget to take his medications. Review and education provided. Education on making sure the patient takes his medication as instructed. His A1C is still elevated but trending down. The patient denies any acute findings in his DM at this time ; Reviewed medications with patient and discussed importance of medication adherence. The patient is now using pill packs and he says this is working out well for him. Encouraged him to take his medications as prescribed and call for any questions or concerns. Denies any issues with his DM at this time. Will continue to monitor. DM is stable. Reviewed prescribed diet with patient heart healthy/ADA diet. The patient states compliance with heart healthy/Ada diet. Education given on monitoring for hidden sodium; Counseled on importance of regular laboratory monitoring as prescribed. Has regular labwork        Discussed plans with patient for ongoing care management follow up and provided patient with direct contact information for care management team;      Provided patient with written educational materials related to hypo and hyperglycemia and importance of correct treatment;       Reviewed scheduled/upcoming provider appointments including: 10-05-2022, reminder given today;         call provider for findings outside established parameters;       Review of patient status, including review of consultants reports, relevant laboratory and other test results, and medications completed;       Advised  patient to discuss changes in his DM and questions or concerns with provider;      Screening for signs and symptoms of depression related to chronic disease state;        Assessed social determinant of health barriers;         Symptom Management: Take medications as prescribed   Attend all scheduled provider appointments Call provider office for new concerns or questions  call the Suicide and Crisis Lifeline: 988 call the [place] National Suicide Prevention Lifeline: 1-800-273-8255 or TTY: 1-800-799-4 TTY (1-800-799-4889) to talk to a trained counselor call 1-800-273-TALK (toll free, 24 hour hotline) if experiencing a Mental Health or Behavioral Health Crisis  check feet daily for cuts, sores or redness trim toenails straight across manage portion size wash and dry feet carefully every day wear comfortable, cotton socks wear comfortable, well-fitting shoes  Follow Up Plan: Telephone follow up appointment with care management team member scheduled for: 11-06-2022 at 1145 am       CCM Expected Outcome:  Monitor, Self-Manage and Reduce Symptoms of Heart Failure       Current Barriers:  Knowledge Deficits related to the importance of monitoring for changes in water weight and effective management of HF Chronic Disease Management support and education needs related to effective management of HF Lacks caregiver support.  Wt Readings from   Last 3 Encounters:  09/23/22 273 lb 6.4 oz (124 kg)  09/17/22 266 lb 3.2 oz (120.7 kg)  09/03/22 269 lb 9.6 oz (122.3 kg)    BP Readings from Last 3 Encounters:  09/23/22 137/69  09/17/22 (!) 143/71  09/03/22 138/68     Planned Interventions: Basic overview and discussion of pathophysiology of Heart Failure reviewed. The patients weight is up and down.  The patient has not taken his torsemide x 2 days. Long standing history of non-compliance. The patient states he has not gotten "any sleep" the last two nights and its because he has been peeing all night  and it is driving him "crazy". He states he cannot keep going like this. Extensive review of sx and sx of fluid overload and the patient states he will take him medicine tomorrow. In basket message sent to the pcp due to the patient stating his urine is still "cloudy". Will follow up accordingly. The patient is seeing the pcp more frequently at this time.   Provided education on low sodium diet. The patient states he does not add salt to his foods. He uses a product called "no salt". The patient states that he is good about not eating foods high in sodium.  Reminder provided on making sure he follows a heart healthy/ADA diet. Review and education provided Reviewed Heart Failure Action Plan in depth and provided written copy. Home health is reporting weight changes to the pcp.  Assessed need for readable accurate scales in home. Discussed safety in weighing. The patient has a hight fall risk. Uses a walker. The patient had not taken his medication or weighed at the time of the outreach x 2 days. Review of the benefits of weights and weight fluctuations determining exacerbations in HF. The patient states he is going to the bathroom and he feels fine. Denies any shortness of breath, says his shortness of breath is about the same as usual and he has some swelling but it is not bad.  Provided education about placing scale on hard, flat surface Advised patient to weigh each morning after emptying bladder. The patient no longer has an indwelling catheter but he is having to go to the bathroom frequently. The patient is thankful not to have the catheter. He says he will not do that again as it was not pleasant. Education on being safe and preventing falls.  Discussed importance of daily weight and advised patient to weigh and record daily. The patient is having fluctuations of weights at this time. Education on the benefits of daily weights and monitoring for weight changes of +2/3 pounds in one day and +5 pounds in a  week.  Reviewed role of diuretics in prevention of fluid overload and management of heart failure. The patient states he is taking his medications as prescribed: Instructed to take Torsemide 40 mg every am. The patient has not taken his medications this am  or yesterday am, as he states he has been going to the bathroom frequently. Education on following the recommendations of the pcp.   Discussed the importance of keeping all appointments with provider. The patient has a follow up with the pcp on 10-05-2022  reminder provided today.   Provided patient with education about the role of exercise in the management of heart failure Advised patient to discuss changes in his heart failure or heart health with provider Screening for signs and symptoms of depression related to chronic disease state  Assessed social determinant of health barriers  Symptom   Management: Take medications as prescribed   Attend all scheduled provider appointments Call provider office for new concerns or questions  call the Suicide and Crisis Lifeline: 988 call the [place] National Suicide Prevention Lifeline: 1-800-273-8255 or TTY: 1-800-799-4 TTY (1-800-799-4889) to talk to a trained counselor call 1-800-273-TALK (toll free, 24 hour hotline) if experiencing a Mental Health or Behavioral Health Crisis  call office if I gain more than 2 pounds in one day or 5 pounds in one week keep legs up while sitting use salt in moderation watch for swelling in feet, ankles and legs every day weigh myself daily develop a rescue plan follow rescue plan if symptoms flare-up track symptoms and what helps feel better or worse dress right for the weather, hot or cold  Follow Up Plan: Telephone follow up appointment with care management team member scheduled for: 11-06-2022 at 1145 am          Plan:Telephone follow up appointment with care management team member scheduled for:  11-06-2022 at 1145 am  Pam Kawon Willcutt RN, MSN, CCM RN Care Manager   Chronic Care Management Direct Number: [phone]          

## 2022-09-27 DIAGNOSIS — I509 Heart failure, unspecified: Secondary | ICD-10-CM | POA: Diagnosis not present

## 2022-09-27 DIAGNOSIS — I69351 Hemiplegia and hemiparesis following cerebral infarction affecting right dominant side: Secondary | ICD-10-CM | POA: Diagnosis not present

## 2022-09-27 DIAGNOSIS — J449 Chronic obstructive pulmonary disease, unspecified: Secondary | ICD-10-CM | POA: Diagnosis not present

## 2022-09-28 ENCOUNTER — Encounter: Payer: Self-pay | Admitting: Oncology

## 2022-09-28 NOTE — Telephone Encounter (Signed)
Received notification from [facility] [employer]) regarding patient assistance DENIAL for [employer].  Due to documentation of 3%  (OOP)out of pocket prescription  expenses, BASED ON HOUSEHOLD ADJUSTED INCOME, NOT MET,   PLEASE BE ADVISED  A COPY OF LETTER IS IN MEDIA OF CHART. Phone:[phone]   I WILL SEND PT A MESSAGE AS WELL TO SEE IF HE HAS SPENT THE 3%

## 2022-09-28 NOTE — Telephone Encounter (Signed)
Called and scheduled patient with another provider on 09/30/2022 @ 11:00 am.

## 2022-09-29 ENCOUNTER — Inpatient Hospital Stay: Payer: PPO

## 2022-09-29 ENCOUNTER — Encounter: Payer: Self-pay | Admitting: Oncology

## 2022-09-29 ENCOUNTER — Inpatient Hospital Stay (HOSPITAL_BASED_OUTPATIENT_CLINIC_OR_DEPARTMENT_OTHER): Payer: PPO | Admitting: Oncology

## 2022-09-29 VITALS — BP 133/74 | HR 52 | Temp 97.4°F | Resp 18 | Wt 269.8 lb

## 2022-09-29 DIAGNOSIS — R310 Gross hematuria: Secondary | ICD-10-CM

## 2022-09-29 DIAGNOSIS — Z86711 Personal history of pulmonary embolism: Secondary | ICD-10-CM

## 2022-09-29 DIAGNOSIS — J4489 Other specified chronic obstructive pulmonary disease: Secondary | ICD-10-CM | POA: Diagnosis not present

## 2022-09-29 DIAGNOSIS — D509 Iron deficiency anemia, unspecified: Secondary | ICD-10-CM | POA: Diagnosis not present

## 2022-09-29 DIAGNOSIS — D508 Other iron deficiency anemias: Secondary | ICD-10-CM

## 2022-09-29 DIAGNOSIS — M549 Dorsalgia, unspecified: Secondary | ICD-10-CM | POA: Diagnosis not present

## 2022-09-29 DIAGNOSIS — I2699 Other pulmonary embolism without acute cor pulmonale: Secondary | ICD-10-CM | POA: Diagnosis not present

## 2022-09-29 DIAGNOSIS — R410 Disorientation, unspecified: Secondary | ICD-10-CM | POA: Diagnosis not present

## 2022-09-29 DIAGNOSIS — I5032 Chronic diastolic (congestive) heart failure: Secondary | ICD-10-CM | POA: Diagnosis not present

## 2022-09-29 DIAGNOSIS — E785 Hyperlipidemia, unspecified: Secondary | ICD-10-CM | POA: Diagnosis not present

## 2022-09-29 DIAGNOSIS — Z86718 Personal history of other venous thrombosis and embolism: Secondary | ICD-10-CM | POA: Diagnosis not present

## 2022-09-29 DIAGNOSIS — R509 Fever, unspecified: Secondary | ICD-10-CM | POA: Diagnosis not present

## 2022-09-29 DIAGNOSIS — Z8719 Personal history of other diseases of the digestive system: Secondary | ICD-10-CM | POA: Diagnosis not present

## 2022-09-29 DIAGNOSIS — G4733 Obstructive sleep apnea (adult) (pediatric): Secondary | ICD-10-CM | POA: Diagnosis not present

## 2022-09-29 DIAGNOSIS — Z79899 Other long term (current) drug therapy: Secondary | ICD-10-CM | POA: Diagnosis not present

## 2022-09-29 DIAGNOSIS — I110 Hypertensive heart disease with heart failure: Secondary | ICD-10-CM | POA: Diagnosis not present

## 2022-09-29 DIAGNOSIS — H9190 Unspecified hearing loss, unspecified ear: Secondary | ICD-10-CM | POA: Diagnosis not present

## 2022-09-29 DIAGNOSIS — Z87891 Personal history of nicotine dependence: Secondary | ICD-10-CM | POA: Diagnosis not present

## 2022-09-29 DIAGNOSIS — I2510 Atherosclerotic heart disease of native coronary artery without angina pectoris: Secondary | ICD-10-CM | POA: Diagnosis not present

## 2022-09-29 DIAGNOSIS — R319 Hematuria, unspecified: Secondary | ICD-10-CM | POA: Diagnosis not present

## 2022-09-29 DIAGNOSIS — Z888 Allergy status to other drugs, medicaments and biological substances status: Secondary | ICD-10-CM | POA: Diagnosis not present

## 2022-09-29 DIAGNOSIS — E119 Type 2 diabetes mellitus without complications: Secondary | ICD-10-CM | POA: Diagnosis not present

## 2022-09-29 DIAGNOSIS — Z9089 Acquired absence of other organs: Secondary | ICD-10-CM | POA: Diagnosis not present

## 2022-09-29 DIAGNOSIS — R5383 Other fatigue: Secondary | ICD-10-CM | POA: Diagnosis not present

## 2022-09-29 DIAGNOSIS — N400 Enlarged prostate without lower urinary tract symptoms: Secondary | ICD-10-CM | POA: Diagnosis not present

## 2022-09-29 DIAGNOSIS — Z7901 Long term (current) use of anticoagulants: Secondary | ICD-10-CM | POA: Diagnosis not present

## 2022-09-29 LAB — CBC WITH DIFFERENTIAL (CANCER CENTER ONLY)
Abs Immature Granulocytes: 0.02 10*3/uL (ref 0.00–0.07)
Basophils Absolute: 0.1 10*3/uL (ref 0.0–0.1)
Basophils Relative: 1 %
Eosinophils Absolute: 0.2 10*3/uL (ref 0.0–0.5)
Eosinophils Relative: 3 %
HCT: 25 % — ABNORMAL LOW (ref 39.0–52.0)
Hemoglobin: 7.1 g/dL — ABNORMAL LOW (ref 13.0–17.0)
Immature Granulocytes: 0 %
Lymphocytes Relative: 19 %
Lymphs Abs: 1.1 10*3/uL (ref 0.7–4.0)
MCH: 22.9 pg — ABNORMAL LOW (ref 26.0–34.0)
MCHC: 28.4 g/dL — ABNORMAL LOW (ref 30.0–36.0)
MCV: 80.6 fL (ref 80.0–100.0)
Monocytes Absolute: 0.6 10*3/uL (ref 0.1–1.0)
Monocytes Relative: 11 %
Neutro Abs: 3.6 10*3/uL (ref 1.7–7.7)
Neutrophils Relative %: 66 %
Platelet Count: 280 10*3/uL (ref 150–400)
RBC: 3.1 MIL/uL — ABNORMAL LOW (ref 4.22–5.81)
RDW: 15.7 % — ABNORMAL HIGH (ref 11.5–15.5)
WBC Count: 5.5 10*3/uL (ref 4.0–10.5)
nRBC: 0 % (ref 0.0–0.2)

## 2022-09-29 LAB — RETIC PANEL
Immature Retic Fract: 21.7 % — ABNORMAL HIGH (ref 2.3–15.9)
RBC.: 3.08 MIL/uL — ABNORMAL LOW (ref 4.22–5.81)
Retic Count, Absolute: 45.9 10*3/uL (ref 19.0–186.0)
Retic Ct Pct: 1.5 % (ref 0.4–3.1)
Reticulocyte Hemoglobin: 18.5 pg — ABNORMAL LOW (ref >27.9)

## 2022-09-29 LAB — IRON AND TIBC
Iron: 22 ug/dL — ABNORMAL LOW (ref 45–182)
Saturation Ratios: 5 % — ABNORMAL LOW (ref 17.9–39.5)
TIBC: 482 ug/dL — ABNORMAL HIGH (ref 250–450)
UIBC: 460 ug/dL

## 2022-09-29 LAB — FERRITIN: Ferritin: 12 ng/mL — ABNORMAL LOW (ref 24–336)

## 2022-09-29 MED FILL — Iron Sucrose Inj 20 MG/ML (Fe Equiv): INTRAVENOUS | Qty: 10 | Status: AC

## 2022-09-29 NOTE — Assessment & Plan Note (Addendum)
No obvious provoking events prior to diagnosis of pulmonary embolism.  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 Negative beta glycoprotein antibodies.   Recommend patient to hold off Elqiuis 2.5mg  BID  due to reports of anemia.  Suspect occult blood loss

## 2022-09-29 NOTE — Progress Notes (Addendum)
Hematology/Oncology Progress note [phone] [phone]) [phone]      CHIEF COMPLAINTS/REASON FOR VISIT:  history of PE  ASSESSMENT & PLAN:   History of pulmonary embolism 09/18/21 No obvious provoking events prior to diagnosis of pulmonary embolism.  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 Negative beta glycoprotein antibodies.   Recommend patient to hold off Elqiuis 2.5mg  BID  due to reports of anemia.  Suspect occult blood loss   IDA (iron deficiency anemia) Lab Results  Component Value Date   HGB 7.1 (L) 09/29/2022   TIBC 482 (H) 09/29/2022   IRONPCTSAT 5 (L) 09/29/2022   FERRITIN 12 (L) 09/29/2022   Patient cannot tell me if he is taking iron supplementation.  Currently iron supplementation is not on his list. Regardless, hemoglobin has further decreased. I recommend IV Venofer treatments. Rationale and potential side effects were reviewed and discussed with patient He is undecided.  Iron level results were available after his visit today.  I will have my RN to call patient to offer him IV Venofer treatments.- addendum RN reached out to patient and he declined IV Venofer.  Recommend ferrous sulfate 325mg  BID. Rx sent   Hematuria He denies any gross hematuria   Orders Placed This Encounter  Procedures   Iron and TIBC    Standing Status:   Future    Standing Expiration Date:   09/29/2023   Ferritin    Standing Status:   Future    Standing Expiration Date:   04/01/2023   CBC with Differential/Platelet    Standing Status:   Future    Standing Expiration Date:   09/29/2023   Retic Panel    Standing Status:   Future    Standing Expiration Date:   09/29/2023   Follow-up in 2 months. All questions were answered. The patient knows to call the  clinic with any problems, questions or concerns.  Rickard Patience, MD, PhD [facility] Hematology Oncology 09/29/2022    HISTORY OF PRESENTING ILLNESS:   LYDEN DEASY is a  82 y.o.  male with PMH listed below was seen in consultation at the request of  Dorcas Carrow, DO  for evaluation of history of pulmonary embolism.   Patient has extensive medical problems, including  BPH, status post HoLEP procedure 09/18/2021,  diastolic CHF, CAD status post CABG, OSA, DVT, COPD, dyslipidemia and hypertension, and history pulmonary embolism.   09/19/21 1 day after his urology surgery, he was found down in his front yard on the ground and was last seen around lunchtime. He was febrile and confused with EMS. He was hypoxic in ED  09/19/2021  Chest CTA revealed the following  1. Positive for pulmonary emboli. There are multiple bilateral segmental pulmonary emboli. Positive for acute PE with CT evidence of right heart strain (RV/LV Ratio = 1.15) consistent with at least submassive (intermediate risk) PE. The presence of right heart strain has been associated with an increased risk of morbidity and mortality. Please refer to the "Code PE Focused" order set in EPIC. 2. Lungs show evidence of vascular shunting and atelectasis, but no evidence of an infarct, pleural effusion or pneumothorax. 3. Mild enlargement of the main pulmonary artery tp 3.9 cm  Patient was treated with anticoagulation with heparin gtt, switched to Eliquis at discharge. Vascular surgeon recommend no intervention. He also was found to  have UTI and was treated with antibiotics.   He has no been on anticoagulation with Eliquis 5mg  BID for 5.5 months. He was referred to establish care for determination of duration of anticoagulation.   INTERVAL HISTORY ALEJANDRA LEMMO is a 82 y.o. male who has above history reviewed by me today presents for follow up visit for pulmonary embolism. Patient takes Eliquis 2.5 mg twice daily.  Patient is a poor  historian. He denies any gross hematuria episodes.  No blood in the stool.  He is not sure if he is taking iron supplementation.   MEDICAL HISTORY:  Past Medical History:  Diagnosis Date   Acute urinary retention    Arthritis    Asthma 2000   Back pain    Colon polyp 2012   COPD (chronic obstructive pulmonary disease) (HCC)    Emphysema of lung (HCC)    Heart murmur    Hernia 2000   History of cold sores 05/06/2020   History of colon cancer    Hyperlipidemia    Hypertension    Personal history of malignant neoplasm of large intestine    Personal history of tobacco use, presenting hazards to health    Septic shock (HCC) 11/18/2021   Sleep apnea    Special screening for malignant neoplasms, colon    Type 2 diabetes mellitus with proteinuria (HCC) 10/11/2014    SURGICAL HISTORY: Past Surgical History:  Procedure Laterality Date   CARDIAC CATHETERIZATION Left 09/25/2015   Procedure: Left Heart Cath and Coronary Angiography;  Surgeon: Alwyn Pea, MD;  Location: [facility] INVASIVE CV LAB;  Service: Cardiovascular;  Laterality: Left;   CHOLECYSTECTOMY  1970   COLON SURGERY  07-16-99   sigmoid colon resection with primary anastomosis, chemotherapy for metastatic disease   COLONOSCOPY  2001, 2012   Dr Lemar Livings, tubular adenoma of the cecum and ascending colon in 2012.   COLONOSCOPY WITH PROPOFOL N/A 02/12/2016   Procedure: COLONOSCOPY WITH PROPOFOL;  Surgeon: Earline Mayotte, MD;  Location: [facility] ENDOSCOPY;  Service: Endoscopy;  Laterality: N/A;   CORONARY STENT INTERVENTION N/A 05/17/2020   Procedure: CORONARY STENT INTERVENTION;  Surgeon: Alwyn Pea, MD;  Location: [facility] INVASIVE CV LAB;  Service: Cardiovascular;  Laterality: N/A;  LAD   ESOPHAGOGASTRODUODENOSCOPY (EGD) WITH PROPOFOL N/A 02/12/2016   Procedure: ESOPHAGOGASTRODUODENOSCOPY (EGD) WITH PROPOFOL;  Surgeon: Earline Mayotte, MD;  Location: [facility] ENDOSCOPY;  Service: Endoscopy;  Laterality: N/A;   HERNIA REPAIR  Right    right inguinal hernia repair   IR 3D INDEPENDENT WKST  05/07/2022   IR ANGIOGRAM PELVIS SELECTIVE OR SUPRASELECTIVE  05/07/2022   IR ANGIOGRAM PELVIS SELECTIVE OR SUPRASELECTIVE  05/07/2022   IR ANGIOGRAM SELECTIVE EACH ADDITIONAL VESSEL  05/07/2022   IR EMBO ARTERIAL NOT HEMORR HEMANG INC GUIDE ROADMAPPING  05/07/2022   IR RADIOLOGIST EVAL & MGMT  02/27/2022   IR RADIOLOGIST EVAL & MGMT  03/25/2022   IR RADIOLOGIST EVAL & MGMT  06/01/2022   IR RADIOLOGIST EVAL & MGMT  08/18/2022   IR US GUIDE VASC ACCESS LEFT  05/07/2022   JOINT REPLACEMENT Bilateral    knee replacement   KNEE SURGERY Bilateral 2010   LEFT HEART CATH AND CORONARY ANGIOGRAPHY N/A 05/17/2020   Procedure: LEFT HEART CATH AND CORONARY ANGIOGRAPHY possible PCI and stent;  Surgeon: Alwyn Pea, MD;  Location: [facility] INVASIVE CV LAB;  Service: Cardiovascular;  Laterality: N/A;   MYRINGOTOMY WITH TUBE PLACEMENT Bilateral 08/16/2014   Procedure: MYRINGOTOMY WITH TUBE PLACEMENT;  Surgeon: Roney Mans  Vaught, MD;  Location: [facility] ORS;  Service: ENT;  Laterality: Bilateral;   PERIPHERAL VASCULAR CATHETERIZATION Right 09/04/2015   Procedure: Lower Extremity Angiography;  Surgeon: Renford Dills, MD;  Location: [facility] INVASIVE CV LAB;  Service: Cardiovascular;  Laterality: Right;   PERIPHERAL VASCULAR CATHETERIZATION  09/04/2015   Procedure: Lower Extremity Intervention;  Surgeon: Renford Dills, MD;  Location: [facility] INVASIVE CV LAB;  Service: Cardiovascular;;   TONSILLECTOMY     TYMPANOSTOMY TUBE PLACEMENT      SOCIAL HISTORY: Social History   Socioeconomic History   Marital status: Married    Spouse name: Not on file   Number of children: Not on file   Years of education: Not on file   Highest education level: GED or equivalent  Occupational History   Occupation: retired    Comment: [employer]  Tobacco Use   Smoking status: Former    Packs/day: 1.00    Years: 30.00    Additional pack years: 0.00    Total pack years: 30.00     Types: Cigarettes    Quit date: 03/23/1989    Years since quitting: 33.5    Passive exposure: Past   Smokeless tobacco: Never  Vaping Use   Vaping Use: Never used  Substance and Sexual Activity   Alcohol use: No    Alcohol/week: 0.0 standard drinks of alcohol   Drug use: No   Sexual activity: Not Currently  Other Topics Concern   Not on file  Social History Narrative   [employer]    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    Social Determinants of Health   Financial Resource Strain: Low Risk  (03/03/2022)   Overall Financial Resource Strain (CARDIA)    Difficulty of Paying Living Expenses: Not hard at all  Food Insecurity: No Food Insecurity (06/23/2022)   Hunger Vital Sign    Worried About Running Out of Food in the Last Year: Never true    Ran Out of Food in the Last Year: Never true  Transportation Needs: No Transportation Needs (06/23/2022)   PRAPARE - Administrator, Civil Service (Medical): No    Lack of Transportation (Non-Medical): No  Physical Activity: Inactive (03/03/2022)   Exercise Vital Sign    Days of Exercise per Week: 0 days    Minutes of Exercise per Session: 0 min  Stress: No Stress Concern Present (03/03/2022)   [employer] of Occupational Health - Occupational Stress Questionnaire    Feeling of Stress : Only a little  Social Connections: Moderately Integrated (03/03/2022)   Social Connection and Isolation Panel [NHANES]    Frequency of Communication with Friends and Family: More than three times a week    Frequency of Social Gatherings with Friends and Family: More than three times a week    Attends Religious Services: More than 4 times per year    Active Member of [employer] or Organizations: No    Attends Banker Meetings: Never    Marital Status: Married  Catering manager Violence: Not At Risk (06/13/2022)   Humiliation, Afraid, Rape, and Kick questionnaire    Fear of Current or Ex-Partner: No    Emotionally Abused: No    Physically Abused:  No    Sexually Abused: No    FAMILY HISTORY: Family History  Problem Relation Age of Onset   Diabetes Father    Esophageal cancer Mother    Alzheimer's disease Paternal Uncle     ALLERGIES:  is allergic to farxiga [dapagliflozin], dulaglutide, liraglutide, and  jardiance [empagliflozin].  MEDICATIONS:  Current Outpatient Medications  Medication Sig Dispense Refill   acyclovir (ZOVIRAX) 400 MG tablet Take 1 tablet (400 mg total) by mouth 2 (two) times daily. 180 tablet 1   amLODipine (NORVASC) 10 MG tablet Take 1 tablet (10 mg total) by mouth daily. 30 tablet 3   ascorbic acid (GNP VITAMIN C) 500 MG tablet TAKE 1 TABLET BY MOUTH ONCE DAILY TO KEEP YOUR IMMUNE SYSTEM UP 90 tablet 3   blood glucose meter kit and supplies 1 each by Other route daily. Dispense based on patient and insurance preference. Use up to four times daily as directed. (FOR ICD-10 E10.9, E11.9). 1 each 0   Blood Glucose Monitoring Suppl (CONTOUR NEXT EZ) w/Device KIT Use to check blood glucose daily E11.65 Type 2 Diabetes 1 kit 0   Blood Pressure Monitor DEVI Use to monitor blood pressure daily 1 each 0   cloNIDine (CATAPRES) 0.1 MG tablet TAKE 1 TABLET BY MOUTH TWICE DAILY FOR BLOOD PRESSURE 180 tablet 1   gabapentin (NEURONTIN) 400 MG capsule Take 1 capsule (400 mg total) by mouth 3 (three) times daily. For nerve pain and itching 270 capsule 1   glucose blood (CONTOUR NEXT TEST) test strip Use to check blood glucose once daily E11.65 Type 2 Diabetes 100 each 12   hydrALAZINE (APRESOLINE) 100 MG tablet Take 1 tablet (100 mg total) by mouth 3 (three) times daily. for blood pressure 270 tablet 1   losartan (COZAAR) 100 MG tablet Take 1 tablet (100 mg total) by mouth daily. For blood pressure 90 tablet 1   metFORMIN (GLUCOPHAGE) 500 MG tablet Take 1 tablet (500 mg total) by mouth 2 (two) times daily with a meal. 180 tablet 1   Microlet Lancets MISC Use to check blood glucose daily E11.65 Type 2 Diabetes 100 each 12    nystatin (MYCOSTATIN/NYSTOP) powder Apply 1 Application topically 3 (three) times daily. 60 g 3   nystatin ointment (MYCOSTATIN) Apply 1 Application topically 2 (two) times daily. 30 g 0   Olopatadine HCl 0.2 % SOLN Apply 1 drop to eye daily. 2.5 mL 3   omeprazole (PRILOSEC) 40 MG capsule TAKE 1 CAPSULE BY MOUTH ONCE DAILY FOR HEARTBURN 90 capsule 1   polyethylene glycol powder (GLYCOLAX/MIRALAX) 17 GM/SCOOP powder Take 17 g by mouth 2 (two) times daily as needed. 3350 g 1   rosuvastatin (CRESTOR) 10 MG tablet Take 1 tablet (10 mg total) by mouth at bedtime. For cholesterol 90 tablet 1   Semaglutide (RYBELSUS) 14 MG TABS 1/2 tab daily for 30 days 30 tablet 0   Semaglutide (RYBELSUS) 14 MG TABS Take 1 tablet (14 mg total) by mouth daily. 30 tablet 3   torsemide (DEMADEX) 20 MG tablet Take 2 tablets (40 mg total) by mouth every morning. 180 tablet 1   umeclidinium bromide (INCRUSE ELLIPTA) 62.5 MCG/ACT AEPB Inhale 1 puff into the lungs daily. For your breathing 30 each 3   apixaban (ELIQUIS) 2.5 MG TABS tablet Take 1 tablet (2.5 mg total) by mouth 2 (two) times daily. 180 tablet 1   apixaban (ELIQUIS) 5 MG TABS tablet 1/2 tab BID x 30 days 60 tablet 0   Current Facility-Administered Medications  Medication Dose Route Frequency Provider Last Rate Last Admin   cyanocobalamin (VITAMIN B12) injection 1,000 mcg  1,000 mcg Intramuscular Q30 days Laural Benes, Megan P, DO   1,000 mcg at 08/03/22 1405    Review of Systems  Constitutional:  Positive for fatigue. Negative for chills,  fever and unexpected weight change.  HENT:   Positive for hearing loss. Negative for voice change.   Eyes:  Negative for eye problems and icterus.  Respiratory:  Negative for chest tightness, cough and shortness of breath.   Cardiovascular:  Positive for leg swelling. Negative for chest pain.  Gastrointestinal:  Negative for abdominal distention and abdominal pain.  Endocrine: Negative for hot flashes.  Genitourinary:  Positive  for difficulty urinating. Negative for dysuria and frequency.   Musculoskeletal:  Positive for back pain. Negative for arthralgias.  Skin:  Negative for itching and rash.  Neurological:  Negative for light-headedness and numbness.  Hematological:  Negative for adenopathy. Bruises/bleeds easily.  Psychiatric/Behavioral:  Negative for confusion.    PHYSICAL EXAMINATION: Vitals:   09/29/22 1403  BP: 133/74  Pulse: (!) 52  Resp: 18  Temp: (!) 97.4 F (36.3 C)   Filed Weights   09/29/22 1403  Weight: 269 lb 12.8 oz (122.4 kg)     Physical Exam Constitutional:      General: He is not in acute distress.    Appearance: He is obese.     Comments: Patient sits in the wheel chair.   HENT:     Head: Normocephalic and atraumatic.  Eyes:     General: No scleral icterus. Cardiovascular:     Rate and Rhythm: Normal rate.  Pulmonary:     Effort: Pulmonary effort is normal. No respiratory distress.  Abdominal:     General: Bowel sounds are normal. There is no distension.     Palpations: Abdomen is soft.  Musculoskeletal:        General: No deformity. Normal range of motion.     Cervical back: Normal range of motion and neck supple.     Right lower leg: Edema present.     Left lower leg: Edema present.  Skin:    General: Skin is warm and dry.     Findings: No erythema or rash.  Neurological:     Mental Status: He is alert and oriented to person, place, and time. Mental status is at baseline.     Cranial Nerves: No cranial nerve deficit.     Coordination: Coordination normal.  Psychiatric:        Mood and Affect: Mood normal.     LABORATORY DATA:  I have reviewed the data as listed    Latest Ref Rng & Units 09/29/2022    2:33 PM 06/30/2022    2:00 PM 06/25/2022    1:40 PM  CBC  WBC 4.0 - 10.5 K/uL 5.5  7.5  8.5   Hemoglobin 13.0 - 17.0 g/dL 7.1  9.0  9.4   Hematocrit 39.0 - 52.0 % 25.0  28.9  29.9   Platelets 150 - 400 K/uL 280  288  322       Latest Ref Rng & Units  09/03/2022   11:12 AM 06/30/2022    2:00 PM 06/25/2022    1:40 PM  CMP  Glucose 70 - 99 mg/dL 161  096  045   BUN 8 - 27 mg/dL 23  36  25   Creatinine 0.76 - 1.27 mg/dL 4.09  8.11  9.14   Sodium 134 - 144 mmol/L 130  131  135   Potassium 3.5 - 5.2 mmol/L 5.5  3.9  4.3   Chloride 96 - 106 mmol/L 95  99  98   CO2 20 - 29 mmol/L 22  23  22    Calcium 8.6 - 10.2 mg/dL  9.4  9.5  10.5   Total Protein 6.5 - 8.1 g/dL  7.3    Total Bilirubin 0.3 - 1.2 mg/dL  0.5    Alkaline Phos 38 - 126 U/L  66    AST 15 - 41 U/L  18    ALT 0 - 44 U/L  12        RADIOGRAPHIC STUDIES: I have personally reviewed the radiological images as listed and agreed with the findings in the report. IR Radiologist Eval & Mgmt  Result Date: 08/18/2022 EXAM: ESTABLISHED PATIENT OFFICE VISIT CHIEF COMPLAINT: See Epic note. HISTORY OF PRESENT ILLNESS: See Epic note. REVIEW OF SYSTEMS: See Epic note. PHYSICAL EXAMINATION: See Epic note. ASSESSMENT AND PLAN: See Epic note. [name], MD [name] Electronically Signed   By: [name] M.D.   On: 08/18/2022 13:13

## 2022-09-29 NOTE — Assessment & Plan Note (Signed)
He denies any gross hematuria

## 2022-09-29 NOTE — Assessment & Plan Note (Addendum)
Lab Results  Component Value Date   HGB 7.1 (L) 09/29/2022   TIBC 482 (H) 09/29/2022   IRONPCTSAT 5 (L) 09/29/2022   FERRITIN 12 (L) 09/29/2022   Patient cannot tell me if he is taking iron supplementation.  Currently iron supplementation is not on his list. Regardless, hemoglobin has further decreased. I recommend IV Venofer treatments. Rationale and potential side effects were reviewed and discussed with patient He is undecided.  Iron level results were available after his visit today.  I will have my RN to call patient to offer him IV Venofer treatments.- addendum RN reached out to patient and he declined IV Venofer.  Recommend ferrous sulfate 325mg  BID. Rx sent

## 2022-09-30 ENCOUNTER — Ambulatory Visit (INDEPENDENT_AMBULATORY_CARE_PROVIDER_SITE_OTHER): Payer: PPO | Admitting: Physician Assistant

## 2022-09-30 ENCOUNTER — Other Ambulatory Visit: Payer: Self-pay | Admitting: Oncology

## 2022-09-30 ENCOUNTER — Encounter: Payer: Self-pay | Admitting: Physician Assistant

## 2022-09-30 ENCOUNTER — Telehealth: Payer: Self-pay

## 2022-09-30 DIAGNOSIS — D508 Other iron deficiency anemias: Secondary | ICD-10-CM | POA: Diagnosis not present

## 2022-09-30 DIAGNOSIS — I5032 Chronic diastolic (congestive) heart failure: Secondary | ICD-10-CM | POA: Diagnosis not present

## 2022-09-30 DIAGNOSIS — J449 Chronic obstructive pulmonary disease, unspecified: Secondary | ICD-10-CM | POA: Diagnosis not present

## 2022-09-30 DIAGNOSIS — R350 Frequency of micturition: Secondary | ICD-10-CM | POA: Diagnosis not present

## 2022-09-30 LAB — MICROSCOPIC EXAMINATION: Bacteria, UA: NONE SEEN

## 2022-09-30 LAB — URINALYSIS, ROUTINE W REFLEX MICROSCOPIC
Bilirubin, UA: NEGATIVE
Glucose, UA: NEGATIVE
Ketones, UA: NEGATIVE
Nitrite, UA: NEGATIVE
RBC, UA: NEGATIVE
Specific Gravity, UA: 1.01 (ref 1.005–1.030)
Urobilinogen, Ur: 0.2 mg/dL (ref 0.2–1.0)
pH, UA: 6 (ref 5.0–7.5)

## 2022-09-30 MED ORDER — FERROUS SULFATE 325 (65 FE) MG PO TBEC: 325.0000 mg | DELAYED_RELEASE_TABLET | Freq: Two times a day (BID) | ORAL | 3 refills | Status: DC

## 2022-09-30 MED ORDER — INCRUSE ELLIPTA 62.5 MCG/ACT IN AEPB
1.0000 | INHALATION_SPRAY | Freq: Every day | RESPIRATORY_TRACT | 3 refills | Status: DC
Start: 2022-09-30 — End: 2022-10-23

## 2022-09-30 NOTE — Progress Notes (Signed)
Acute Office Visit   Patient: Justin Griffith   DOB: 1941/01/03   82 y.o. Male  MRN: [id number] Visit Date: 09/30/2022  Today's healthcare provider: Oswaldo Conroy Jager Koska, PA-C  Introduced myself to the patient as a Secondary school teacher and provided education on APPs in clinical practice.    Chief Complaint  Patient presents with   Follow-up   Subjective    HPI   Patient was previously seen for acute cystitis on 09/23/22   He states he is having trouble catching his breath  He states he has been taking his fluid pills since Monday or Tuesday  He does not think his breathing has been getting better since he restarted the fluid pills    He reports he has been urinating frequently  He reports seldom dysuria   Medications: Outpatient Medications Prior to Visit  Medication Sig   acyclovir (ZOVIRAX) 400 MG tablet Take 1 tablet (400 mg total) by mouth 2 (two) times daily.   amLODipine (NORVASC) 10 MG tablet Take 1 tablet (10 mg total) by mouth daily.   apixaban (ELIQUIS) 2.5 MG TABS tablet Take 1 tablet (2.5 mg total) by mouth 2 (two) times daily.   apixaban (ELIQUIS) 5 MG TABS tablet 1/2 tab BID x 30 days   ascorbic acid (GNP VITAMIN C) 500 MG tablet TAKE 1 TABLET BY MOUTH ONCE DAILY TO KEEP YOUR IMMUNE SYSTEM UP   blood glucose meter kit and supplies 1 each by Other route daily. Dispense based on patient and insurance preference. Use up to four times daily as directed. (FOR ICD-10 E10.9, E11.9).   Blood Glucose Monitoring Suppl (CONTOUR NEXT EZ) w/Device KIT Use to check blood glucose daily E11.65 Type 2 Diabetes   Blood Pressure Monitor DEVI Use to monitor blood pressure daily   cloNIDine (CATAPRES) 0.1 MG tablet TAKE 1 TABLET BY MOUTH TWICE DAILY FOR BLOOD PRESSURE   gabapentin (NEURONTIN) 400 MG capsule Take 1 capsule (400 mg total) by mouth 3 (three) times daily. For nerve pain and itching   glucose blood (CONTOUR NEXT TEST) test strip Use to check blood glucose once daily E11.65 Type 2  Diabetes   hydrALAZINE (APRESOLINE) 100 MG tablet Take 1 tablet (100 mg total) by mouth 3 (three) times daily. for blood pressure   losartan (COZAAR) 100 MG tablet Take 1 tablet (100 mg total) by mouth daily. For blood pressure   metFORMIN (GLUCOPHAGE) 500 MG tablet Take 1 tablet (500 mg total) by mouth 2 (two) times daily with a meal.   Microlet Lancets MISC Use to check blood glucose daily E11.65 Type 2 Diabetes   nystatin (MYCOSTATIN/NYSTOP) powder Apply 1 Application topically 3 (three) times daily.   nystatin ointment (MYCOSTATIN) Apply 1 Application topically 2 (two) times daily.   Olopatadine HCl 0.2 % SOLN Apply 1 drop to eye daily.   omeprazole (PRILOSEC) 40 MG capsule TAKE 1 CAPSULE BY MOUTH ONCE DAILY FOR HEARTBURN   polyethylene glycol powder (GLYCOLAX/MIRALAX) 17 GM/SCOOP powder Take 17 g by mouth 2 (two) times daily as needed.   rosuvastatin (CRESTOR) 10 MG tablet Take 1 tablet (10 mg total) by mouth at bedtime. For cholesterol   Semaglutide (RYBELSUS) 14 MG TABS 1/2 tab daily for 30 days   Semaglutide (RYBELSUS) 14 MG TABS Take 1 tablet (14 mg total) by mouth daily.   torsemide (DEMADEX) 20 MG tablet Take 2 tablets (40 mg total) by mouth every morning.   [DISCONTINUED] ferrous sulfate 325 (65  FE) MG EC tablet Take 1 tablet (325 mg total) by mouth 2 (two) times daily with a meal.   [DISCONTINUED] umeclidinium bromide (INCRUSE ELLIPTA) 62.5 MCG/ACT AEPB Inhale 1 puff into the lungs daily. For your breathing   Facility-Administered Medications Prior to Visit  Medication Dose Route Frequency Provider   cyanocobalamin (VITAMIN B12) injection 1,000 mcg  1,000 mcg Intramuscular Q30 days Laural Benes, Megan P, DO    Review of Systems  Constitutional:  Negative for chills and fever.  Respiratory:  Positive for cough and shortness of breath. Negative for wheezing.   Cardiovascular:  Negative for chest pain and palpitations.  Genitourinary:  Positive for dysuria (infrequent) and frequency.        Objective    There were no vitals taken for this visit.   Physical Exam Vitals reviewed.  Constitutional:      General: He is awake.     Appearance: Normal appearance. He is well-developed and well-groomed.  HENT:     Head: Normocephalic and atraumatic.  Cardiovascular:     Rate and Rhythm: Normal rate and regular rhythm.     Pulses: Normal pulses.          Radial pulses are 2+ on the right side and 2+ on the left side.     Heart sounds: Murmur heard.     Systolic murmur is present with a grade of 2/6.     No friction rub. No gallop.  Pulmonary:     Effort: Pulmonary effort is normal. No accessory muscle usage, prolonged expiration, respiratory distress or retractions.     Breath sounds: Normal breath sounds. Decreased air movement present. No decreased breath sounds, wheezing, rhonchi or rales.  Musculoskeletal:     Right lower leg: 1+ Pitting Edema present.     Left lower leg: 1+ Pitting Edema present.  Neurological:     Mental Status: He is alert.  Psychiatric:        Attention and Perception: Attention normal.        Mood and Affect: Mood normal. Affect is labile and angry.        Speech: Speech normal.        Behavior: Behavior is agitated. Behavior is cooperative.       Results for orders placed or performed in visit on 09/30/22  Microscopic Examination   Urine  Result Value Ref Range   WBC, UA 11-30 (A) 0 - 5 /hpf   RBC, Urine 0-2 0 - 2 /hpf   Epithelial Cells (non renal) 0-10 0 - 10 /hpf   Mucus, UA Present (A) Not Estab.   Bacteria, UA None seen None seen/Few  Urinalysis, Routine w reflex microscopic  Result Value Ref Range   Specific Gravity, UA 1.010 1.005 - 1.030   pH, UA 6.0 5.0 - 7.5   Color, UA Yellow Yellow   Appearance Ur Cloudy (A) Clear   Leukocytes,UA 3+ (A) Negative   Protein,UA 2+ (A) Negative/Trace   Glucose, UA Negative Negative   Ketones, UA Negative Negative   RBC, UA Negative Negative   Bilirubin, UA Negative Negative    Urobilinogen, Ur 0.2 0.2 - 1.0 mg/dL   Nitrite, UA Negative Negative   Microscopic Examination See below:     Assessment & Plan      No follow-ups on file.      Problem List Items Addressed This Visit       Cardiovascular and Mediastinum   Chronic diastolic CHF (congestive heart failure) (HCC)  Chronic, historic condition Patient reports that he was instructed to hold his fluid pill due to increased urinary frequency but has been taking them since Monday or Tuesday due to difficulty catching his breath and swelling in his legs. PE demonstrates some restricted air movement and lungs but no wheezes, rhonchi, Rales Mild systolic murmur heard as well Recommend that he continues with his torsemide daily to improve edema and breathing Recheck at follow-up appointment next week or sooner if concerns arise        Respiratory   Chronic obstructive pulmonary disease (COPD) (HCC)    Chronic, historic condition Patient does not appear to be using his inhaler for maintenance therapy Will send in refill of the Incruse Ellipta for him to use daily with goal of improving lung function and assisting with shortness of breath Recheck next week at follow-up      Relevant Medications   umeclidinium bromide (INCRUSE ELLIPTA) 62.5 MCG/ACT AEPB     Other   IDA (iron deficiency anemia) (Chronic)    Chronic, historic, ongoing condition Patient sees hematology for management but was unsure of their most recent recommendations for him I reviewed the previous telephone notes in which he was instructed to stop his Eliquis and start iron supplementation Patient was uncomfortable with stopping his Eliquis and appears very confused as he reports another doctor told him to start it/continue with and Dr.Yu has told him to stop that he is unsure what to do He reports that he would prefer to talk about this with Dr. Laural Benes and will continue taking his Eliquis for now I discussed with him the importance of  starting his iron supplementation especially if he is going to continue taking his Eliquis and he was amenable to starting this The office called his pharmacy to inquire about including his iron supplements as part of his pill packs but he has recently picked up his latest pill pack so there would be a cost to remake them.  Pharmacy agreed to provide a bottle for now and will included in his subsequent packs going forward Continue collaboration with hematology      Relevant Medications   ferrous sulfate 325 (65 FE) MG EC tablet   Other Visit Diagnoses     Urinary frequency    -  Primary Recurrent concern UA is concerning for signs of UTI however findings are consistent with his previous UA Last week he was treated for acute cystitis with ceftriaxone injection. His urine culture from the previous appointment demonstrated normal urogenital flora Will send off for culture again today await results before providing another antibiotic regimen Follow-up as needed for progressing or persistent symptoms   Relevant Orders   Urinalysis, Routine w reflex microscopic (Completed)   Urine Culture        No follow-ups on file.   I, Johnn Krasowski E Callahan Wild, PA-C, have reviewed all documentation for this visit. The documentation on 10/02/22 for the exam, diagnosis, procedures, and orders are all accurate and complete.   Jacquelin Hawking, MHS, PA-C [name]

## 2022-09-30 NOTE — Telephone Encounter (Signed)
Called and spoke to pt, he declined iron infusion stating that he would rather do iron pills. Also advised him to stop Eliquis. Dr.Yu has sent message to Dr. Laural Benes to make her aware of this. Rx for oral iron has been sent.

## 2022-09-30 NOTE — Telephone Encounter (Signed)
Called pt. No answer. Will mail appt reminders.

## 2022-09-30 NOTE — Telephone Encounter (Signed)
-----   Message from Rickard Patience, MD sent at 09/29/2022  7:08 PM EDT ----- Please let patient know that his blood count further decreased and has severe iron deficiency.  I highly recommend him to consider IV Venofer treatments.  If he agrees, please arrange him to take IV Venofer weekly x 4.  Also advised patient to stop Eliquis. Please arrange patient to follow-up in 2 months, labs prior to MD +/- Venofer.  Labs ordered.  Thank you

## 2022-09-30 NOTE — Patient Instructions (Addendum)
I have sent in a script for an inhaler for you to use to help with your breathing  Please use it as directed   Please Continue to take your fluid pill once per day  This will help with your breathing and the swelling in your legs  We will keep you updated on the results of your urine culture.   I have sent the iron pills to [facility] and they will add it to your pill packs

## 2022-09-30 NOTE — Telephone Encounter (Signed)
Entered in error 

## 2022-10-02 NOTE — Assessment & Plan Note (Signed)
Chronic, historic condition Patient does not appear to be using his inhaler for maintenance therapy Will send in refill of the Incruse Ellipta for him to use daily with goal of improving lung function and assisting with shortness of breath Recheck next week at follow-up

## 2022-10-02 NOTE — Assessment & Plan Note (Signed)
Chronic, historic condition Patient reports that he was instructed to hold his fluid pill due to increased urinary frequency but has been taking them since Monday or Tuesday due to difficulty catching his breath and swelling in his legs. PE demonstrates some restricted air movement and lungs but no wheezes, rhonchi, Rales Mild systolic murmur heard as well Recommend that he continues with his torsemide daily to improve edema and breathing Recheck at follow-up appointment next week or sooner if concerns arise

## 2022-10-02 NOTE — Assessment & Plan Note (Signed)
Chronic, historic, ongoing condition Patient sees hematology for management but was unsure of their most recent recommendations for him I reviewed the previous telephone notes in which he was instructed to stop his Eliquis and start iron supplementation Patient was uncomfortable with stopping his Eliquis and appears very confused as he reports another doctor told him to start it/continue with and Dr.Yu has told him to stop that he is unsure what to do He reports that he would prefer to talk about this with Dr. Laural Benes and will continue taking his Eliquis for now I discussed with him the importance of starting his iron supplementation especially if he is going to continue taking his Eliquis and he was amenable to starting this The office called his pharmacy to inquire about including his iron supplements as part of his pill packs but he has recently picked up his latest pill pack so there would be a cost to remake them.  Pharmacy agreed to provide a bottle for now and will included in his subsequent packs going forward Continue collaboration with hematology

## 2022-10-03 LAB — URINE CULTURE

## 2022-10-05 ENCOUNTER — Telehealth: Payer: Self-pay

## 2022-10-05 ENCOUNTER — Ambulatory Visit: Payer: PPO | Admitting: Family Medicine

## 2022-10-05 ENCOUNTER — Encounter: Payer: Self-pay | Admitting: Family Medicine

## 2022-10-05 VITALS — BP 133/68 | HR 78 | Temp 97.6°F | Wt 277.2 lb

## 2022-10-05 DIAGNOSIS — I5032 Chronic diastolic (congestive) heart failure: Secondary | ICD-10-CM

## 2022-10-05 DIAGNOSIS — R350 Frequency of micturition: Secondary | ICD-10-CM | POA: Diagnosis not present

## 2022-10-05 DIAGNOSIS — D508 Other iron deficiency anemias: Secondary | ICD-10-CM

## 2022-10-05 NOTE — Progress Notes (Signed)
BP 133/68   Pulse 78   Temp 97.6 F (36.4 C) (Oral)   Wt 277 lb 3.2 oz (125.7 kg)   SpO2 98%   BMI 39.77 kg/m    Subjective:    Patient ID: Justin Griffith, male    DOB: 05-04-1940, 82 y.o.   MRN: [id number]  HPI: Justin Griffith is a 82 y.o. male  Chief Complaint  Patient presents with   Congestive Heart Failure   Willis notes that he was having a lot of urinary frequency so he stopped taking his torsemide. He then started with swelling in his legs and SOB. He started his medicine back yesterday. He has continues with a lot of swelling. He does note that his urine got a little better when he held his medicine, but he started feeling worse nearly immediately.   ANEMIA Anemia status: uncontrolled Etiology of anemia: iron deficiency Duration of anemia treatment: not on anything Compliance with treatment: N/A Iron supplementation side effects: no Severity of anemia: moderate Fatigue: yes Decreased exercise tolerance: yes  Dyspnea on exertion: yes Palpitations: yes Bleeding: no Pica: no  Relevant past medical, surgical, family and social history reviewed and updated as indicated. Interim medical history since our last visit reviewed. Allergies and medications reviewed and updated.  Review of Systems  Constitutional: Negative.   HENT: Negative.    Respiratory:  Positive for shortness of breath. Negative for apnea, cough, choking, chest tightness, wheezing and stridor.   Cardiovascular: Negative.   Neurological: Negative.   Psychiatric/Behavioral: Negative.      Per HPI unless specifically indicated above     Objective:    BP 133/68   Pulse 78   Temp 97.6 F (36.4 C) (Oral)   Wt 277 lb 3.2 oz (125.7 kg)   SpO2 98%   BMI 39.77 kg/m   Wt Readings from Last 3 Encounters:  10/05/22 277 lb 3.2 oz (125.7 kg)  09/29/22 269 lb 12.8 oz (122.4 kg)  09/23/22 273 lb 6.4 oz (124 kg)    Physical Exam Vitals and nursing note reviewed.  Constitutional:      General: He  is not in acute distress.    Appearance: Normal appearance. He is obese. He is not ill-appearing, toxic-appearing or diaphoretic.  HENT:     Head: Normocephalic and atraumatic.     Right Ear: External ear normal.     Left Ear: External ear normal.     Nose: Nose normal.     Mouth/Throat:     Mouth: Mucous membranes are moist.     Pharynx: Oropharynx is clear.  Eyes:     General: No scleral icterus.       Right eye: No discharge.        Left eye: No discharge.     Extraocular Movements: Extraocular movements intact.     Conjunctiva/sclera: Conjunctivae normal.     Pupils: Pupils are equal, round, and reactive to light.  Cardiovascular:     Rate and Rhythm: Normal rate and regular rhythm.     Pulses: Normal pulses.     Heart sounds: Normal heart sounds. No murmur heard.    No friction rub. No gallop.  Pulmonary:     Effort: Pulmonary effort is normal. No respiratory distress.     Breath sounds: Normal breath sounds. No stridor. No wheezing, rhonchi or rales.  Chest:     Chest wall: No tenderness.  Musculoskeletal:        General: Normal range of motion.  Cervical back: Normal range of motion and neck supple.     Right lower leg: Edema (2+) present.     Left lower leg: Edema (2+) present.  Skin:    General: Skin is warm and dry.     Capillary Refill: Capillary refill takes less than 2 seconds.     Coloration: Skin is not jaundiced or pale.     Findings: No bruising, erythema, lesion or rash.  Neurological:     General: No focal deficit present.     Mental Status: He is alert and oriented to person, place, and time. Mental status is at baseline.  Psychiatric:        Mood and Affect: Mood normal.        Behavior: Behavior normal.        Thought Content: Thought content normal.        Judgment: Judgment normal.     Results for orders placed or performed in visit on 09/30/22  Urine Culture   Specimen: Urine   UR  Result Value Ref Range   Urine Culture, Routine Final  report    Organism ID, Bacteria Comment   Microscopic Examination   Urine  Result Value Ref Range   WBC, UA 11-30 (A) 0 - 5 /hpf   RBC, Urine 0-2 0 - 2 /hpf   Epithelial Cells (non renal) 0-10 0 - 10 /hpf   Mucus, UA Present (A) Not Estab.   Bacteria, UA None seen None seen/Few  Urinalysis, Routine w reflex microscopic  Result Value Ref Range   Specific Gravity, UA 1.010 1.005 - 1.030   pH, UA 6.0 5.0 - 7.5   Color, UA Yellow Yellow   Appearance Ur Cloudy (A) Clear   Leukocytes,UA 3+ (A) Negative   Protein,UA 2+ (A) Negative/Trace   Glucose, UA Negative Negative   Ketones, UA Negative Negative   RBC, UA Negative Negative   Bilirubin, UA Negative Negative   Urobilinogen, Ur 0.2 0.2 - 1.0 mg/dL   Nitrite, UA Negative Negative   Microscopic Examination See below:       Assessment & Plan:   Problem List Items Addressed This Visit       Cardiovascular and Mediastinum   Chronic diastolic CHF (congestive heart failure) (HCC) - Primary    Lungs clear but swelling again. Restarted his torsemide yesterday. Continue torsemide. Call with any concerns.        Other   IDA (iron deficiency anemia) (Chronic)    Would like to do IV iron infusions. Message sent to hematology- they will arrange. Will stop eliquis. He will bring his pill pack to [facility] to have eliquis removed.       Other Visit Diagnoses     Urinary frequency       Following up with urology next week. Encouraged him to take his torsemide in the AM. Recheck about 10 days.        Follow up plan: Return in about 2 weeks (around 10/19/2022).

## 2022-10-05 NOTE — Assessment & Plan Note (Signed)
Would like to do IV iron infusions. Message sent to hematology- they will arrange. Will stop eliquis. He will bring his pill pack to [facility] to have eliquis removed.

## 2022-10-05 NOTE — Assessment & Plan Note (Signed)
Lungs clear but swelling again. Restarted his torsemide yesterday. Continue torsemide. Call with any concerns.

## 2022-10-05 NOTE — Telephone Encounter (Signed)
Received message from Dr. Laural Benes, "Justin Griffith says he will do the iron infusions. If you give him a call he'll schedule them."  Please contact pt to setup IV venofer weekly x4. (First dose is NEW)

## 2022-10-05 NOTE — Progress Notes (Signed)
Urogenital flora seen again. I did not treat him when I saw him since this was the same result for previous urine culture.

## 2022-10-07 ENCOUNTER — Encounter: Payer: Self-pay | Admitting: Oncology

## 2022-10-07 NOTE — Telephone Encounter (Signed)
Can you reach out one more time to see if he can be reached please.

## 2022-10-12 ENCOUNTER — Telehealth: Payer: Self-pay

## 2022-10-12 NOTE — Telephone Encounter (Signed)
RECEIVE NOTICE FROM [employer] FOR RYBELSUS REFILL REORDER. I HAVE SENT PROVIDER [facility] MEGAN JOHNSON OFFICE FOR REVIEW AND SEND BACK TO BE SENT TO BE SENT FOR PROCESSING  PLEASE BE ADVISED   Melanee Spry CPhT [facility] [phone]

## 2022-10-13 NOTE — Telephone Encounter (Signed)
Paperwork received and placed in provider folder.

## 2022-10-15 ENCOUNTER — Ambulatory Visit (INDEPENDENT_AMBULATORY_CARE_PROVIDER_SITE_OTHER): Payer: PPO | Admitting: Urology

## 2022-10-15 ENCOUNTER — Encounter: Payer: Self-pay | Admitting: Oncology

## 2022-10-15 VITALS — BP 153/69 | HR 77

## 2022-10-15 DIAGNOSIS — N401 Enlarged prostate with lower urinary tract symptoms: Secondary | ICD-10-CM

## 2022-10-15 DIAGNOSIS — N3281 Overactive bladder: Secondary | ICD-10-CM

## 2022-10-15 DIAGNOSIS — R399 Unspecified symptoms and signs involving the genitourinary system: Secondary | ICD-10-CM

## 2022-10-15 LAB — BLADDER SCAN AMB NON-IMAGING

## 2022-10-15 MED ORDER — FINASTERIDE 5 MG PO TABS: 5.0000 mg | ORAL_TABLET | Freq: Every day | ORAL | 3 refills | Status: DC

## 2022-10-15 MED ORDER — TAMSULOSIN HCL 0.4 MG PO CAPS: 0.4000 mg | ORAL_CAPSULE | Freq: Every day | ORAL | 3 refills | Status: DC

## 2022-10-15 NOTE — Progress Notes (Signed)
   10/15/2022 10:31 AM   Sherald Hess 04-05-1940 [id number]  Reason for visit: Follow up BPH, urinary retention, history of gross hematuria and recurrent UTI  HPI: Extremely comorbid 82 year old male with BPH with recurrent gross hematuria, recurrent UTIs, and urinary retention managed long-term with chronic Foley catheter for 9 months in 2023.  Prostate measures ~200 g on CT.  He had originally opted for HOLEP to address his urinary retention and recurrent gross hematuria, however had a pulmonary embolus 1 week prior to scheduled surgery in June 2023 that required anticoagulation with Eliquis.  He was seen by our PA in July 2023 and was supposed to be scheduled with interventional radiology to consider PAE for both his urinary retention and recurrent gross hematuria, but he no showed multiple follow-up appointments from them.  He had numerous hospital visits for non-draining Foley and hematuria in 2023/2024.  He ultimately underwent prostatic artery embolization with Dr. Elby Showers from interventional radiology on 05/07/2022.  He was hospitalized on 06/13/2022 for nondraining Foley catheter/hematuria, and Foley catheter was ultimately removed that hospitalization and he was voiding spontaneously.  He was having severe dysuria and urinary frequency at that time that has since resolved.  Initial urine culture showed no growth, however urine culture on 06/25/2022 showed low growth of staph hemolyticus, Enterococcus, and Raoultella-he was started on 4-week course of doxycycline by Hilton Sinclair, PA at that time.  The Enterococcus was not sensitive to any antibiotics.  He does think the doxycycline resolved the dysuria.  Most recent urine culture 09/30/2022 showed 10-25k mixed flora.  I personally viewed and interpreted the CT scan dated 06/13/2022(5 weeks after PAE), prostate measured 188g.  He continues to have significant urinary frequency, urgency, and urge incontinence.  He is on very high-dose  diuretics which likely contributes, as well as poorly controlled diabetes(recent HgA1c 11.5).  Bladder scan in clinic was initially , however he then had a large void into his depends, with a PVR of 0ml.  Very challenging situation, and his other comorbidities of high-dose diuretics, morbid obesity, and poorly controlled diabetes likely contribute to his urinary frequency and urgency.  I remain very hesitant to consider an OAB medication on him with high risk of developing recurrent retention.  With the elevated bladder scan on initial presentation today, I think it is reasonable to trial him back on Flomax and finasteride to try to prevent any recurrent episodes of urinary retention.  Addition of Flomax and finasteride RTC 4 months PVR Need to have realistic expectations with his numerous other medical issues contributing to his urinary symptoms  I spent 45 total minutes on the day of the encounter including pre-visit review of the medical record, face-to-face time with the patient, and post visit ordering of labs/imaging/tests.   Sondra Come, MD  [facility] [address], [address] [place], [place] [address] [phone]

## 2022-10-15 NOTE — Telephone Encounter (Signed)
Received notification from [employer] regarding approval for RYBELSUS 14 MG. Patient assistance approved from 10/14/2022 to 07/25/2023.  Phone: [phone]  PLEASE BE ADVISED PT CAN CALL NUMBER ABOVE ON SHIPPING STATUS  LETTER BEING MAIL TO PT HOME

## 2022-10-16 ENCOUNTER — Ambulatory Visit (INDEPENDENT_AMBULATORY_CARE_PROVIDER_SITE_OTHER): Payer: PPO | Admitting: Family Medicine

## 2022-10-16 ENCOUNTER — Encounter: Payer: Self-pay | Admitting: Family Medicine

## 2022-10-16 VITALS — BP 113/56 | HR 90 | Temp 98.2°F | Wt 289.6 lb

## 2022-10-16 DIAGNOSIS — I5033 Acute on chronic diastolic (congestive) heart failure: Secondary | ICD-10-CM | POA: Diagnosis not present

## 2022-10-16 MED ORDER — TORSEMIDE 20 MG PO TABS: ORAL_TABLET | ORAL | 2 refills | Status: DC

## 2022-10-16 NOTE — Patient Instructions (Signed)
Take 60mg  of your Torsemide Saturday, Sunday and Monday I'll see you on Tuesday  I'm going to get you set up to see Clarisa Kindred- the nurse who runs the heart failure clinic

## 2022-10-16 NOTE — Assessment & Plan Note (Signed)
Has gained 12lbs in the last 11 days. Has not been taking his torsemide due to increased urination. Rales bilaterally. Warning signs to go to ER discussed. Will take 60mg  of torsemide in the AM for the next 3 days. Follow up in 3 days. Watch BP- running a bit low today, but high yesterday. Labile.

## 2022-10-16 NOTE — Progress Notes (Signed)
BP (!) 113/56   Pulse 90   Temp 98.2 F (36.8 C) (Oral)   Wt 289 lb 9.6 oz (131.4 kg)   SpO2 97%   BMI 41.55 kg/m    Subjective:    Patient ID: Justin Griffith, male    DOB: 1941-01-09, 82 y.o.   MRN: [id number]  HPI: Justin Griffith is a 82 y.o. male  Chief Complaint  Patient presents with   Fall    Pt states he has been losing his balance and had several falls over the last few days   [place] is not feeling well today. He notes that his legs have been swelling a lot and he has been having weeping from his R leg. He has gained about 12 pounds in the past 11 days. He has been very tired and SOB. He has not been able to walk because he gets too short of breath. He also notes that his legs feel really heavy. He has been falling at home several times. No other concerns or complaints at this time.   Relevant past medical, surgical, family and social history reviewed and updated as indicated. Interim medical history since our last visit reviewed. Allergies and medications reviewed and updated.  Review of Systems  Constitutional:  Positive for fatigue. Negative for activity change, appetite change, chills, diaphoresis, fever and unexpected weight change.  Respiratory:  Positive for shortness of breath. Negative for apnea, cough, choking, chest tightness, wheezing and stridor.   Cardiovascular:  Positive for leg swelling. Negative for chest pain and palpitations.  Gastrointestinal: Negative.   Musculoskeletal: Negative.   Neurological: Negative.   Psychiatric/Behavioral: Negative.      Per HPI unless specifically indicated above     Objective:    BP (!) 113/56   Pulse 90   Temp 98.2 F (36.8 C) (Oral)   Wt 289 lb 9.6 oz (131.4 kg)   SpO2 97%   BMI 41.55 kg/m   Wt Readings from Last 3 Encounters:  10/16/22 289 lb 9.6 oz (131.4 kg)  10/05/22 277 lb 3.2 oz (125.7 kg)  09/29/22 269 lb 12.8 oz (122.4 kg)    Physical Exam Vitals and nursing note reviewed.  Constitutional:       General: He is not in acute distress.    Appearance: Normal appearance. He is obese. He is not ill-appearing, toxic-appearing or diaphoretic.  HENT:     Head: Normocephalic and atraumatic.     Right Ear: External ear normal.     Left Ear: External ear normal.     Nose: Nose normal.     Mouth/Throat:     Mouth: Mucous membranes are moist.     Pharynx: Oropharynx is clear.  Eyes:     General: No scleral icterus.       Right eye: No discharge.        Left eye: No discharge.     Extraocular Movements: Extraocular movements intact.     Conjunctiva/sclera: Conjunctivae normal.     Pupils: Pupils are equal, round, and reactive to light.  Cardiovascular:     Rate and Rhythm: Normal rate and regular rhythm.     Pulses: Normal pulses.     Heart sounds: Normal heart sounds. No murmur heard.    No friction rub. No gallop.  Pulmonary:     Effort: Pulmonary effort is normal. No respiratory distress.     Breath sounds: No stridor. Rales (bilateral bases) present. No wheezing or rhonchi.  Chest:  Chest wall: No tenderness.  Musculoskeletal:        General: Normal range of motion.     Cervical back: Normal range of motion and neck supple.     Right lower leg: Edema (3+) present.     Left lower leg: Edema (3+) present.  Skin:    General: Skin is warm and dry.     Capillary Refill: Capillary refill takes less than 2 seconds.     Coloration: Skin is not jaundiced or pale.     Findings: No bruising, erythema, lesion or rash.  Neurological:     General: No focal deficit present.     Mental Status: He is alert and oriented to person, place, and time. Mental status is at baseline.  Psychiatric:        Mood and Affect: Mood normal.        Behavior: Behavior normal.        Thought Content: Thought content normal.        Judgment: Judgment normal.     Results for orders placed or performed in visit on 10/15/22  BLADDER SCAN AMB NON-IMAGING  Result Value Ref Range   Scan Result        Assessment & Plan:   Problem List Items Addressed This Visit       Cardiovascular and Mediastinum   Chronic diastolic CHF (congestive heart failure) (HCC) - Primary    Has gained 12lbs in the last 11 days. Has not been taking his torsemide due to increased urination. Rales bilaterally. Warning signs to go to ER discussed. Will take 60mg  of torsemide in the AM for the next 3 days. Follow up in 3 days. Watch BP- running a bit low today, but high yesterday. Labile.       Relevant Medications   torsemide (DEMADEX) 20 MG tablet     Follow up plan: Return in about 3 days (around 10/19/2022).

## 2022-10-19 ENCOUNTER — Inpatient Hospital Stay: Payer: PPO

## 2022-10-19 VITALS — BP 135/73 | HR 95 | Temp 98.5°F | Resp 20

## 2022-10-19 DIAGNOSIS — Z86711 Personal history of pulmonary embolism: Secondary | ICD-10-CM | POA: Diagnosis not present

## 2022-10-19 DIAGNOSIS — D508 Other iron deficiency anemias: Secondary | ICD-10-CM

## 2022-10-19 MED ORDER — SODIUM CHLORIDE 0.9 % IV SOLN
Freq: Once | INTRAVENOUS | Status: AC
  Filled 2022-10-19: qty 250

## 2022-10-19 MED ORDER — SODIUM CHLORIDE 0.9 % IV SOLN
200.0000 mg | Freq: Once | INTRAVENOUS | Status: AC
  Administered 2022-10-19: 200 mg via INTRAVENOUS
  Filled 2022-10-19: qty 200

## 2022-10-19 NOTE — Patient Instructions (Signed)
Iron Sucrose Injection What is this medication? IRON SUCROSE (EYE ern SOO krose) treats low levels of iron (iron deficiency anemia) in people with kidney disease. Iron is a mineral that plays an important role in making red blood cells, which carry oxygen from your lungs to the rest of your body. This medicine may be used for other purposes; ask your health care provider or pharmacist if you have questions. COMMON BRAND NAME(S): Venofer What should I tell my care team before I take this medication? They need to know if you have any of these conditions: Anemia not caused by low iron levels Heart disease High levels of iron in the blood Kidney disease Liver disease An unusual or allergic reaction to iron, other medications, foods, dyes, or preservatives Pregnant or trying to get pregnant Breastfeeding How should I use this medication? This medication is for infusion into a vein. It is given in a hospital or clinic setting. Talk to your care team about the use of this medication in children. While this medication may be prescribed for children as young as 2 years for selected conditions, precautions do apply. Overdosage: If you think you have taken too much of this medicine contact a poison control center or emergency room at once. NOTE: This medicine is only for you. Do not share this medicine with others. What if I miss a dose? Keep appointments for follow-up doses. It is important not to miss your dose. Call your care team if you are unable to keep an appointment. What may interact with this medication? Do not take this medication with any of the following: Deferoxamine Dimercaprol Other iron products This medication may also interact with the following: Chloramphenicol Deferasirox This list may not describe all possible interactions. Give your health care provider a list of all the medicines, herbs, non-prescription drugs, or dietary supplements you use. Also tell them if you smoke,  drink alcohol, or use illegal drugs. Some items may interact with your medicine. What should I watch for while using this medication? Visit your care team regularly. Tell your care team if your symptoms do not start to get better or if they get worse. You may need blood work done while you are taking this medication. You may need to follow a special diet. Talk to your care team. Foods that contain iron include: whole grains/cereals, dried fruits, beans, or peas, leafy green vegetables, and organ meats (liver, kidney). What side effects may I notice from receiving this medication? Side effects that you should report to your care team as soon as possible: Allergic reactions--skin rash, itching, hives, swelling of the face, lips, tongue, or throat Low blood pressure--dizziness, feeling faint or lightheaded, blurry vision Shortness of breath Side effects that usually do not require medical attention (report to your care team if they continue or are bothersome): Flushing Headache Joint pain Muscle pain Nausea Pain, redness, or irritation at injection site This list may not describe all possible side effects. Call your doctor for medical advice about side effects. You may report side effects to FDA at 1-800-FDA-1088. Where should I keep my medication? This medication is given in a hospital or clinic. It will not be stored at home. NOTE: This sheet is a summary. It may not cover all possible information. If you have questions about this medicine, talk to your doctor, pharmacist, or health care provider.  2024 Elsevier/Gold Standard (2022-08-14 00:00:00)

## 2022-10-20 ENCOUNTER — Telehealth: Payer: Self-pay | Admitting: Family Medicine

## 2022-10-20 ENCOUNTER — Other Ambulatory Visit: Payer: Self-pay | Admitting: Family Medicine

## 2022-10-20 ENCOUNTER — Telehealth: Payer: Self-pay

## 2022-10-20 ENCOUNTER — Ambulatory Visit: Payer: PPO | Admitting: Family Medicine

## 2022-10-20 DIAGNOSIS — I469 Cardiac arrest, cause unspecified: Secondary | ICD-10-CM | POA: Diagnosis not present

## 2022-10-20 DIAGNOSIS — I5032 Chronic diastolic (congestive) heart failure: Secondary | ICD-10-CM

## 2022-10-20 DIAGNOSIS — R739 Hyperglycemia, unspecified: Secondary | ICD-10-CM | POA: Diagnosis not present

## 2022-10-20 DIAGNOSIS — Z794 Long term (current) use of insulin: Secondary | ICD-10-CM

## 2022-10-20 DIAGNOSIS — R404 Transient alteration of awareness: Secondary | ICD-10-CM | POA: Diagnosis not present

## 2022-10-20 DIAGNOSIS — E1165 Type 2 diabetes mellitus with hyperglycemia: Secondary | ICD-10-CM

## 2022-10-20 DIAGNOSIS — R0689 Other abnormalities of breathing: Secondary | ICD-10-CM | POA: Diagnosis not present

## 2022-10-20 DIAGNOSIS — I499 Cardiac arrhythmia, unspecified: Secondary | ICD-10-CM | POA: Diagnosis not present

## 2022-10-20 DIAGNOSIS — J449 Chronic obstructive pulmonary disease, unspecified: Secondary | ICD-10-CM

## 2022-10-21 NOTE — Telephone Encounter (Signed)
Medication D/C 08/03/22 Requested Prescriptions  Pending Prescriptions Disp Refills   amLODipine (NORVASC) 5 MG tablet [Pharmacy Med Name: AMLODIPINE BESYLATE 5 MG TAB] 30 tablet 3    Sig: TAKE 1 TABLET BY MOUTH ONCE DAILY     Cardiovascular: Calcium Channel Blockers 2 Passed - 09/22/2022 11:57 AM      Passed - Last BP in normal range    BP Readings from Last 1 Encounters:  10/19/22 135/73         Passed - Last Heart Rate in normal range    Pulse Readings from Last 1 Encounters:  10/19/22 95         Passed - Valid encounter within last 6 months    Recent Outpatient Visits           5 days ago Acute on chronic diastolic CHF (congestive heart failure) (HCC)   [facility], Megan P, DO   2 weeks ago Chronic diastolic CHF (congestive heart failure) (HCC)   [place] [facility] [place], Megan P, DO   3 weeks ago Urinary frequency   [name] [place], Erin E, PA-C   4 weeks ago Acute cystitis without hematuria   [place] [facility] [place], Megan P, DO   1 month ago Chronic diastolic CHF (congestive heart failure) [facility])   [place] [facility] Dorcas Carrow, DO       Future Appointments             In 4 months Richardo Hanks, Laurette Schimke, MD [facility] [place]

## 2022-10-22 NOTE — Telephone Encounter (Signed)
Notification of Death  from Officer [employer] Cardiac arrest front yard ME not at scene- but will office back when he arrives.  Appears natural per Officer. Called to ask about PMH regarding heart.

## 2022-10-22 NOTE — Telephone Encounter (Signed)
Mandy with [facility] is requesting a call back to make sure there are no changes for patient's medications, before she starts working on compliance packing. Please advise.   Mandy's callback # [phone]

## 2022-10-22 NOTE — Telephone Encounter (Signed)
Spoke with Angelica Chessman from [facility] to notify her the patient had passed away today per Dr Laural Benes. Mandy verbalized understanding.

## 2022-10-22 DEATH — deceased

## 2022-10-26 ENCOUNTER — Inpatient Hospital Stay: Payer: BC Managed Care – PPO

## 2022-10-28 DIAGNOSIS — I509 Heart failure, unspecified: Secondary | ICD-10-CM | POA: Diagnosis not present

## 2022-10-28 DIAGNOSIS — I69351 Hemiplegia and hemiparesis following cerebral infarction affecting right dominant side: Secondary | ICD-10-CM | POA: Diagnosis not present

## 2022-10-28 DIAGNOSIS — J449 Chronic obstructive pulmonary disease, unspecified: Secondary | ICD-10-CM | POA: Diagnosis not present

## 2022-10-29 ENCOUNTER — Encounter: Payer: BC Managed Care – PPO | Admitting: Family

## 2022-11-03 ENCOUNTER — Inpatient Hospital Stay: Payer: BC Managed Care – PPO

## 2022-11-06 ENCOUNTER — Telehealth: Payer: PPO

## 2022-11-09 ENCOUNTER — Inpatient Hospital Stay: Payer: BC Managed Care – PPO

## 2022-11-16 ENCOUNTER — Telehealth: Payer: Self-pay | Admitting: Family Medicine

## 2022-11-16 NOTE — Telephone Encounter (Signed)
Marquesha from [employer] called stated they has shipped the Rybelsus and it should be delivered today. [employer] Tracking# [id number]

## 2022-11-30 ENCOUNTER — Other Ambulatory Visit: Payer: BC Managed Care – PPO

## 2022-12-02 ENCOUNTER — Ambulatory Visit: Payer: BC Managed Care – PPO | Admitting: Oncology

## 2022-12-02 ENCOUNTER — Ambulatory Visit: Payer: BC Managed Care – PPO

## 2023-02-25 ENCOUNTER — Ambulatory Visit: Payer: PPO | Admitting: Urology

## 2024-02-11 IMAGING — DX DG CHEST 1V PORT
1 series · 1 of 1 positions shown · non-contrast
Comparison: 05/17/2020

CLINICAL DATA: Suspected sepsis, fall

EXAM:
PORTABLE CHEST 1 VIEW

[chest ap]
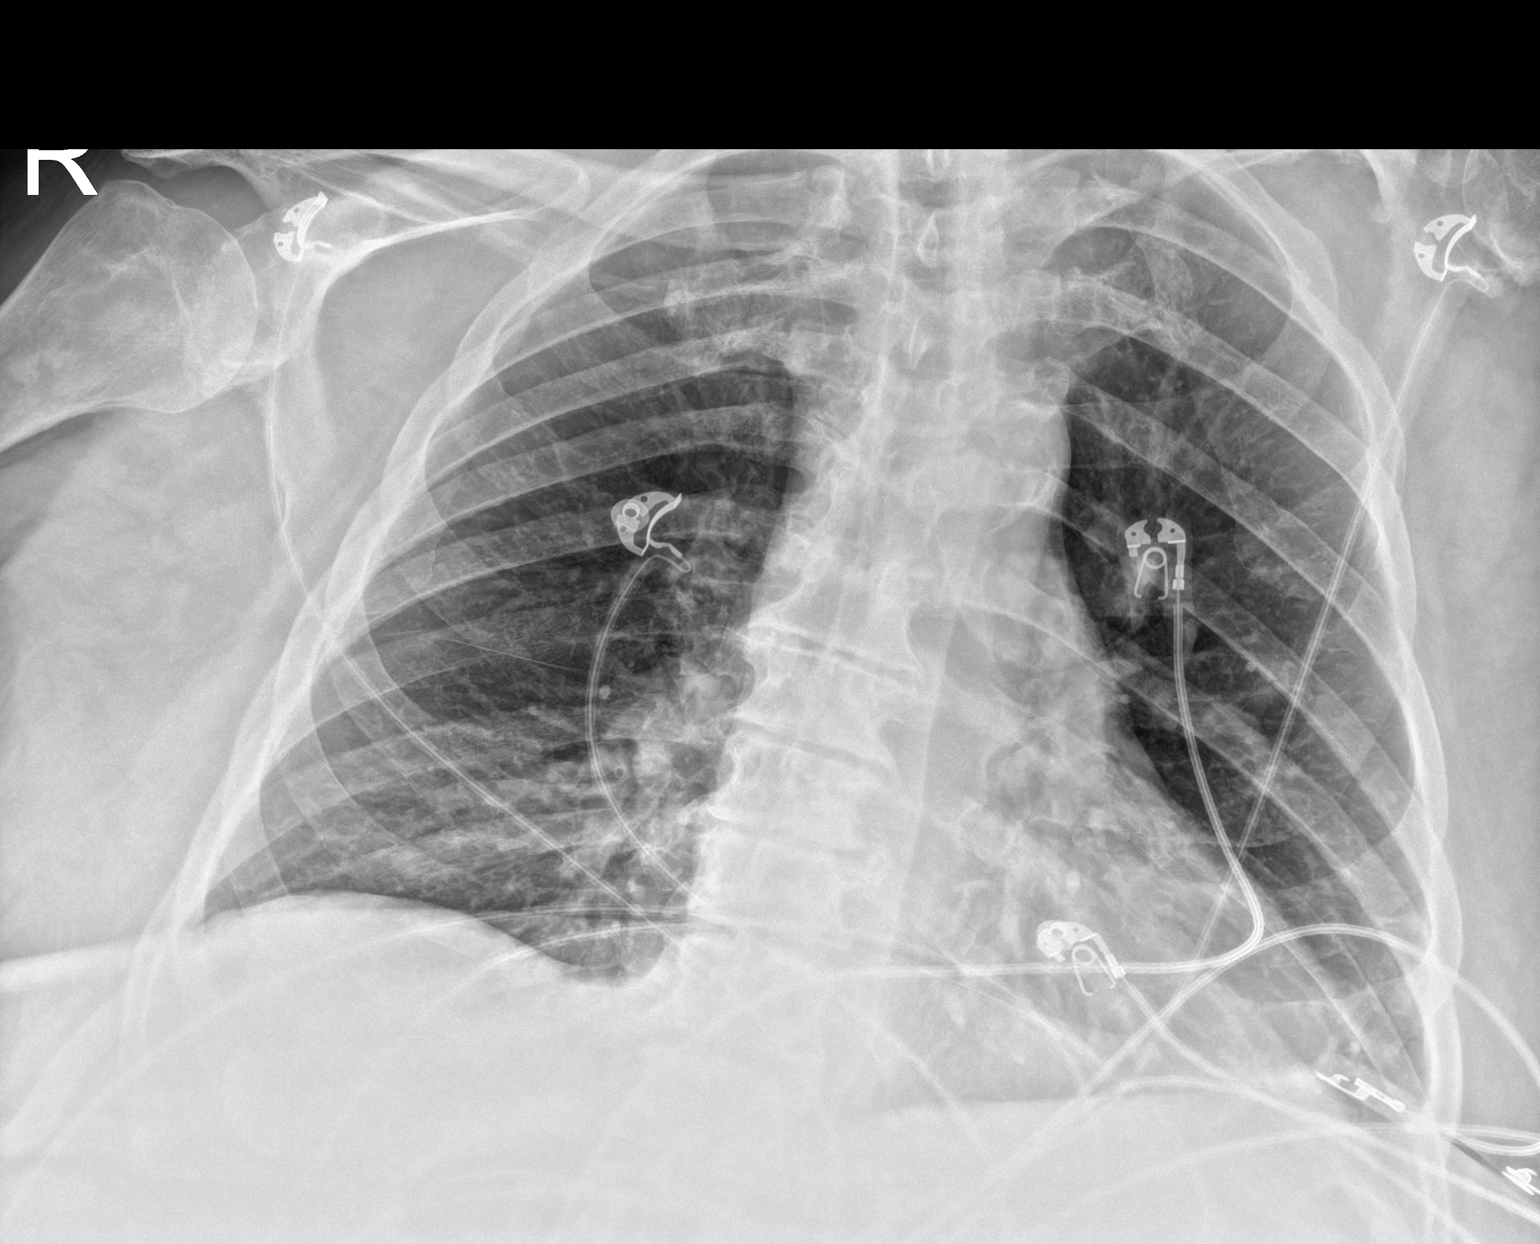

[1 of 1 positions shown; findings below may reference images not displayed]

FINDINGS: Normal cardiac and mediastinal contours. Aortic atherosclerosis. No
new focal pulmonary opacity. No definite pleural effusion or
pneumothorax. No displaced fracture.
IMPRESSION: No acute cardiopulmonary process.
